# Patient Record
Sex: Female | Born: 1955 | Race: White | Hispanic: No | Smoking: Former smoker
Health system: Southern US, Community
[De-identification: ages and names within clinical notes are randomized; demographics above are authoritative.]

## PROBLEM LIST (undated history)

## (undated) DIAGNOSIS — M2550 Pain in unspecified joint: Secondary | ICD-10-CM

## (undated) DIAGNOSIS — M549 Dorsalgia, unspecified: Secondary | ICD-10-CM

## (undated) DIAGNOSIS — K759 Inflammatory liver disease, unspecified: Secondary | ICD-10-CM

## (undated) DIAGNOSIS — M545 Low back pain: Secondary | ICD-10-CM

## (undated) DIAGNOSIS — J019 Acute sinusitis, unspecified: Secondary | ICD-10-CM

## (undated) DIAGNOSIS — L049 Acute lymphadenitis, unspecified: Secondary | ICD-10-CM

## (undated) DIAGNOSIS — I509 Heart failure, unspecified: Secondary | ICD-10-CM

## (undated) DIAGNOSIS — L0390 Cellulitis, unspecified: Secondary | ICD-10-CM

## (undated) DIAGNOSIS — F411 Generalized anxiety disorder: Secondary | ICD-10-CM

## (undated) DIAGNOSIS — M25519 Pain in unspecified shoulder: Secondary | ICD-10-CM

## (undated) DIAGNOSIS — G8929 Other chronic pain: Secondary | ICD-10-CM

## (undated) DIAGNOSIS — A4902 Methicillin resistant Staphylococcus aureus infection, unspecified site: Secondary | ICD-10-CM

## (undated) DIAGNOSIS — E785 Hyperlipidemia, unspecified: Secondary | ICD-10-CM

## (undated) DIAGNOSIS — K219 Gastro-esophageal reflux disease without esophagitis: Secondary | ICD-10-CM

## (undated) DIAGNOSIS — M5090 Cervical disc disorder, unspecified, unspecified cervical region: Secondary | ICD-10-CM

## (undated) DIAGNOSIS — R7302 Impaired glucose tolerance (oral): Secondary | ICD-10-CM

## (undated) DIAGNOSIS — I70209 Unspecified atherosclerosis of native arteries of extremities, unspecified extremity: Secondary | ICD-10-CM

## (undated) DIAGNOSIS — J309 Allergic rhinitis, unspecified: Secondary | ICD-10-CM

## (undated) DIAGNOSIS — IMO0002 Reserved for concepts with insufficient information to code with codable children: Secondary | ICD-10-CM

## (undated) DIAGNOSIS — K5730 Diverticulosis of large intestine without perforation or abscess without bleeding: Secondary | ICD-10-CM

## (undated) DIAGNOSIS — R011 Cardiac murmur, unspecified: Secondary | ICD-10-CM

## (undated) DIAGNOSIS — F329 Major depressive disorder, single episode, unspecified: Secondary | ICD-10-CM

## (undated) DIAGNOSIS — M519 Unspecified thoracic, thoracolumbar and lumbosacral intervertebral disc disorder: Secondary | ICD-10-CM

## (undated) DIAGNOSIS — I880 Nonspecific mesenteric lymphadenitis: Secondary | ICD-10-CM

## (undated) DIAGNOSIS — I10 Essential (primary) hypertension: Secondary | ICD-10-CM

## (undated) DIAGNOSIS — J189 Pneumonia, unspecified organism: Secondary | ICD-10-CM

## (undated) DIAGNOSIS — E119 Type 2 diabetes mellitus without complications: Secondary | ICD-10-CM

## (undated) DIAGNOSIS — R52 Pain, unspecified: Secondary | ICD-10-CM

## (undated) DIAGNOSIS — L03019 Cellulitis of unspecified finger: Secondary | ICD-10-CM

## (undated) DIAGNOSIS — K654 Sclerosing mesenteritis: Secondary | ICD-10-CM

## (undated) DIAGNOSIS — R21 Rash and other nonspecific skin eruption: Secondary | ICD-10-CM

## (undated) HISTORY — DX: Allergic rhinitis, unspecified: J30.9

## (undated) HISTORY — DX: Cardiac murmur, unspecified: R01.1

## (undated) HISTORY — DX: Pain in unspecified shoulder: M25.519

## (undated) HISTORY — DX: Methicillin resistant Staphylococcus aureus infection, unspecified site: A49.02

## (undated) HISTORY — PX: CERVICAL DISC SURGERY: SHX588

## (undated) HISTORY — DX: Unspecified thoracic, thoracolumbar and lumbosacral intervertebral disc disorder: M51.9

## (undated) HISTORY — DX: Impaired glucose tolerance (oral): R73.02

## (undated) HISTORY — DX: Major depressive disorder, single episode, unspecified: F32.9

## (undated) HISTORY — DX: Reserved for concepts with insufficient information to code with codable children: IMO0002

## (undated) HISTORY — DX: Inflammatory liver disease, unspecified: K75.9

## (undated) HISTORY — DX: Generalized anxiety disorder: F41.1

## (undated) HISTORY — DX: Pain in unspecified joint: M25.50

## (undated) HISTORY — DX: Nonspecific mesenteric lymphadenitis: I88.0

## (undated) HISTORY — DX: Cellulitis of unspecified finger: L03.019

## (undated) HISTORY — DX: Low back pain: M54.5

## (undated) HISTORY — DX: Gastro-esophageal reflux disease without esophagitis: K21.9

## (undated) HISTORY — DX: Cervical disc disorder, unspecified, unspecified cervical region: M50.90

## (undated) HISTORY — DX: Unspecified atherosclerosis of native arteries of extremities, unspecified extremity: I70.209

## (undated) HISTORY — DX: Rash and other nonspecific skin eruption: R21

## (undated) HISTORY — DX: Sclerosing mesenteritis: K65.4

## (undated) HISTORY — DX: Acute lymphadenitis, unspecified: L04.9

## (undated) HISTORY — DX: Diverticulosis of large intestine without perforation or abscess without bleeding: K57.30

## (undated) HISTORY — DX: Other chronic pain: G89.29

## (undated) HISTORY — DX: Dorsalgia, unspecified: M54.9

## (undated) HISTORY — DX: Acute sinusitis, unspecified: J01.90

## (undated) HISTORY — PX: OTHER SURGICAL HISTORY: SHX169

## (undated) HISTORY — DX: Essential (primary) hypertension: I10

---
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
## 1997-12-21 HISTORY — PX: ABDOMINAL HYSTERECTOMY: SHX81

## 1998-02-18 ENCOUNTER — Observation Stay (HOSPITAL_COMMUNITY): Admission: RE | Admit: 1998-02-18 | Discharge: 1998-02-19 | Payer: Self-pay | Admitting: *Deleted

## 1999-02-19 ENCOUNTER — Encounter: Admission: RE | Admit: 1999-02-19 | Discharge: 1999-02-19 | Payer: Self-pay | Admitting: Family Medicine

## 1999-03-30 ENCOUNTER — Emergency Department (HOSPITAL_COMMUNITY): Admission: EM | Admit: 1999-03-30 | Discharge: 1999-03-30 | Payer: Self-pay | Admitting: Emergency Medicine

## 1999-03-30 ENCOUNTER — Encounter: Payer: Self-pay | Admitting: Emergency Medicine

## 1999-12-01 ENCOUNTER — Ambulatory Visit (HOSPITAL_COMMUNITY): Admission: RE | Admit: 1999-12-01 | Discharge: 1999-12-01 | Payer: Self-pay | Admitting: *Deleted

## 1999-12-23 ENCOUNTER — Encounter: Payer: Self-pay | Admitting: Internal Medicine

## 1999-12-23 LAB — CONVERTED CEMR LAB

## 2000-01-13 ENCOUNTER — Encounter: Payer: Self-pay | Admitting: Internal Medicine

## 2000-01-13 ENCOUNTER — Ambulatory Visit (HOSPITAL_COMMUNITY): Admission: RE | Admit: 2000-01-13 | Discharge: 2000-01-13 | Payer: Self-pay | Admitting: Internal Medicine

## 2000-02-18 ENCOUNTER — Ambulatory Visit (HOSPITAL_BASED_OUTPATIENT_CLINIC_OR_DEPARTMENT_OTHER): Admission: RE | Admit: 2000-02-18 | Discharge: 2000-02-18 | Payer: Self-pay | Admitting: Orthopedic Surgery

## 2000-03-18 ENCOUNTER — Ambulatory Visit (HOSPITAL_BASED_OUTPATIENT_CLINIC_OR_DEPARTMENT_OTHER): Admission: RE | Admit: 2000-03-18 | Discharge: 2000-03-18 | Payer: Self-pay | Admitting: Orthopedic Surgery

## 2001-01-07 ENCOUNTER — Encounter: Payer: Self-pay | Admitting: Family Medicine

## 2001-01-07 ENCOUNTER — Ambulatory Visit (HOSPITAL_COMMUNITY): Admission: RE | Admit: 2001-01-07 | Discharge: 2001-01-07 | Payer: Self-pay | Admitting: Family Medicine

## 2001-04-25 ENCOUNTER — Encounter: Payer: Self-pay | Admitting: Family Medicine

## 2001-04-25 ENCOUNTER — Ambulatory Visit (HOSPITAL_COMMUNITY): Admission: RE | Admit: 2001-04-25 | Discharge: 2001-04-25 | Payer: Self-pay | Admitting: Family Medicine

## 2001-12-29 ENCOUNTER — Encounter: Payer: Self-pay | Admitting: Family Medicine

## 2001-12-29 ENCOUNTER — Ambulatory Visit (HOSPITAL_COMMUNITY): Admission: RE | Admit: 2001-12-29 | Discharge: 2001-12-29 | Payer: Self-pay | Admitting: Family Medicine

## 2002-02-11 ENCOUNTER — Encounter: Payer: Self-pay | Admitting: Emergency Medicine

## 2002-02-11 ENCOUNTER — Emergency Department (HOSPITAL_COMMUNITY): Admission: EM | Admit: 2002-02-11 | Discharge: 2002-02-12 | Payer: Self-pay | Admitting: Emergency Medicine

## 2002-09-05 LAB — HM COLONOSCOPY

## 2002-10-02 ENCOUNTER — Other Ambulatory Visit: Admission: RE | Admit: 2002-10-02 | Discharge: 2002-10-02 | Payer: Self-pay | Admitting: *Deleted

## 2002-10-02 ENCOUNTER — Other Ambulatory Visit: Admission: RE | Admit: 2002-10-02 | Discharge: 2002-10-02 | Payer: Self-pay | Admitting: Obstetrics and Gynecology

## 2002-10-31 ENCOUNTER — Ambulatory Visit (HOSPITAL_COMMUNITY): Admission: RE | Admit: 2002-10-31 | Discharge: 2002-10-31 | Payer: Self-pay | Admitting: Internal Medicine

## 2002-10-31 ENCOUNTER — Encounter: Payer: Self-pay | Admitting: Internal Medicine

## 2003-01-19 ENCOUNTER — Encounter: Payer: Self-pay | Admitting: Internal Medicine

## 2003-01-19 ENCOUNTER — Encounter: Admission: RE | Admit: 2003-01-19 | Discharge: 2003-01-19 | Payer: Self-pay | Admitting: Internal Medicine

## 2003-06-06 ENCOUNTER — Emergency Department (HOSPITAL_COMMUNITY): Admission: EM | Admit: 2003-06-06 | Discharge: 2003-06-06 | Payer: Self-pay | Admitting: Emergency Medicine

## 2003-06-06 ENCOUNTER — Encounter: Payer: Self-pay | Admitting: Emergency Medicine

## 2003-10-22 ENCOUNTER — Encounter: Admission: RE | Admit: 2003-10-22 | Discharge: 2003-11-28 | Payer: Self-pay | Admitting: Neurosurgery

## 2003-11-02 ENCOUNTER — Encounter: Admission: RE | Admit: 2003-11-02 | Discharge: 2003-11-02 | Payer: Self-pay | Admitting: Neurosurgery

## 2003-11-23 ENCOUNTER — Encounter: Admission: RE | Admit: 2003-11-23 | Discharge: 2003-11-23 | Payer: Self-pay | Admitting: Neurosurgery

## 2003-12-10 ENCOUNTER — Encounter: Admission: RE | Admit: 2003-12-10 | Discharge: 2003-12-10 | Payer: Self-pay | Admitting: Neurosurgery

## 2004-02-04 ENCOUNTER — Emergency Department (HOSPITAL_COMMUNITY): Admission: EM | Admit: 2004-02-04 | Discharge: 2004-02-04 | Payer: Self-pay | Admitting: Emergency Medicine

## 2004-02-12 ENCOUNTER — Emergency Department (HOSPITAL_COMMUNITY): Admission: EM | Admit: 2004-02-12 | Discharge: 2004-02-12 | Payer: Self-pay | Admitting: *Deleted

## 2004-12-21 DIAGNOSIS — A4902 Methicillin resistant Staphylococcus aureus infection, unspecified site: Secondary | ICD-10-CM

## 2004-12-21 HISTORY — DX: Methicillin resistant Staphylococcus aureus infection, unspecified site: A49.02

## 2005-09-02 ENCOUNTER — Ambulatory Visit: Payer: Self-pay | Admitting: Internal Medicine

## 2005-09-17 ENCOUNTER — Ambulatory Visit: Payer: Self-pay | Admitting: Internal Medicine

## 2005-09-20 ENCOUNTER — Emergency Department (HOSPITAL_COMMUNITY): Admission: EM | Admit: 2005-09-20 | Discharge: 2005-09-20 | Payer: Self-pay | Admitting: Emergency Medicine

## 2005-10-13 ENCOUNTER — Ambulatory Visit: Payer: Self-pay | Admitting: Infectious Diseases

## 2005-10-13 ENCOUNTER — Ambulatory Visit: Payer: Self-pay | Admitting: Internal Medicine

## 2005-10-13 ENCOUNTER — Inpatient Hospital Stay (HOSPITAL_COMMUNITY): Admission: EM | Admit: 2005-10-13 | Discharge: 2005-10-19 | Payer: Self-pay | Admitting: *Deleted

## 2005-10-27 ENCOUNTER — Ambulatory Visit: Payer: Self-pay | Admitting: Internal Medicine

## 2005-11-09 ENCOUNTER — Ambulatory Visit: Payer: Self-pay | Admitting: Infectious Diseases

## 2005-11-20 ENCOUNTER — Ambulatory Visit: Payer: Self-pay | Admitting: Infectious Diseases

## 2006-01-08 ENCOUNTER — Ambulatory Visit: Payer: Self-pay | Admitting: Internal Medicine

## 2006-09-14 ENCOUNTER — Ambulatory Visit (HOSPITAL_COMMUNITY): Admission: RE | Admit: 2006-09-14 | Discharge: 2006-09-14 | Payer: Self-pay | Admitting: Obstetrics and Gynecology

## 2006-12-21 LAB — CONVERTED CEMR LAB: Pap Smear: NORMAL

## 2006-12-31 ENCOUNTER — Ambulatory Visit: Payer: Self-pay | Admitting: Internal Medicine

## 2006-12-31 LAB — CONVERTED CEMR LAB
ALT: 25 units/L (ref 0–40)
AST: 22 units/L (ref 0–37)
Albumin: 3.7 g/dL (ref 3.5–5.2)
Alkaline Phosphatase: 61 units/L (ref 39–117)
BUN: 10 mg/dL (ref 6–23)
Basophils Absolute: 0.1 10*3/uL (ref 0.0–0.1)
Basophils Relative: 1 % (ref 0.0–1.0)
Bilirubin Urine: NEGATIVE
CO2: 31 meq/L (ref 19–32)
Calcium: 8.9 mg/dL (ref 8.4–10.5)
Chloride: 107 meq/L (ref 96–112)
Chol/HDL Ratio, serum: 4.5
Cholesterol: 167 mg/dL (ref 0–200)
Creatinine, Ser: 1 mg/dL (ref 0.4–1.2)
Eosinophil percent: 2.3 % (ref 0.0–5.0)
GFR calc non Af Amer: 63 mL/min
Glomerular Filtration Rate, Af Am: 76 mL/min/{1.73_m2}
Glucose, Bld: 107 mg/dL — ABNORMAL HIGH (ref 70–99)
HCT: 40.4 % (ref 36.0–46.0)
HDL: 37.4 mg/dL — ABNORMAL LOW (ref >39.0)
Hemoglobin, Urine: NEGATIVE
Hemoglobin: 13.9 g/dL (ref 12.0–15.0)
Ketones, ur: NEGATIVE mg/dL
LDL Cholesterol: 104 mg/dL — ABNORMAL HIGH (ref 0–99)
Leukocytes, UA: NEGATIVE
Lymphocytes Relative: 40 % (ref 12.0–46.0)
MCHC: 34.4 g/dL (ref 30.0–36.0)
MCV: 91.8 fL (ref 78.0–100.0)
Monocytes Absolute: 0.8 10*3/uL — ABNORMAL HIGH (ref 0.2–0.7)
Monocytes Relative: 8.8 % (ref 3.0–11.0)
Neutro Abs: 4.7 10*3/uL (ref 1.4–7.7)
Neutrophils Relative %: 47.9 % (ref 43.0–77.0)
Nitrite: NEGATIVE
Platelets: 292 10*3/uL (ref 150–400)
Potassium: 4.4 meq/L (ref 3.5–5.1)
RBC: 4.4 M/uL (ref 3.87–5.11)
RDW: 11.4 % — ABNORMAL LOW (ref 11.5–14.6)
Sodium: 142 meq/L (ref 135–145)
Specific Gravity, Urine: 1.02 (ref 1.000–1.03)
TSH: 2.26 microintl units/mL (ref 0.35–5.50)
Total Bilirubin: 0.9 mg/dL (ref 0.3–1.2)
Total Protein, Urine: NEGATIVE mg/dL
Total Protein: 6.7 g/dL (ref 6.0–8.3)
Triglyceride fasting, serum: 126 mg/dL (ref 0–149)
Urine Glucose: NEGATIVE mg/dL
Urobilinogen, UA: 0.2 (ref 0.0–1.0)
VLDL: 25 mg/dL (ref 0–40)
WBC: 9.6 10*3/uL (ref 4.5–10.5)
pH: 6 (ref 5.0–8.0)

## 2007-01-05 ENCOUNTER — Ambulatory Visit: Payer: Self-pay | Admitting: Internal Medicine

## 2007-03-02 ENCOUNTER — Ambulatory Visit: Payer: Self-pay | Admitting: Internal Medicine

## 2007-03-22 ENCOUNTER — Ambulatory Visit: Payer: Self-pay | Admitting: Pulmonary Disease

## 2007-04-11 ENCOUNTER — Ambulatory Visit (HOSPITAL_BASED_OUTPATIENT_CLINIC_OR_DEPARTMENT_OTHER): Admission: RE | Admit: 2007-04-11 | Discharge: 2007-04-11 | Payer: Self-pay | Admitting: Pulmonary Disease

## 2007-04-22 ENCOUNTER — Ambulatory Visit: Payer: Self-pay | Admitting: Internal Medicine

## 2007-04-28 ENCOUNTER — Ambulatory Visit: Payer: Self-pay | Admitting: Pulmonary Disease

## 2007-05-03 ENCOUNTER — Ambulatory Visit: Payer: Self-pay | Admitting: Pulmonary Disease

## 2007-08-10 ENCOUNTER — Encounter: Payer: Self-pay | Admitting: Internal Medicine

## 2007-08-10 DIAGNOSIS — J309 Allergic rhinitis, unspecified: Secondary | ICD-10-CM | POA: Insufficient documentation

## 2007-08-10 HISTORY — DX: Allergic rhinitis, unspecified: J30.9

## 2007-12-06 ENCOUNTER — Ambulatory Visit: Payer: Self-pay | Admitting: Internal Medicine

## 2007-12-06 DIAGNOSIS — F411 Generalized anxiety disorder: Secondary | ICD-10-CM | POA: Insufficient documentation

## 2007-12-06 DIAGNOSIS — F329 Major depressive disorder, single episode, unspecified: Secondary | ICD-10-CM

## 2007-12-06 DIAGNOSIS — F3289 Other specified depressive episodes: Secondary | ICD-10-CM

## 2007-12-06 DIAGNOSIS — K219 Gastro-esophageal reflux disease without esophagitis: Secondary | ICD-10-CM | POA: Insufficient documentation

## 2007-12-06 DIAGNOSIS — I10 Essential (primary) hypertension: Secondary | ICD-10-CM

## 2007-12-06 DIAGNOSIS — M545 Low back pain, unspecified: Secondary | ICD-10-CM | POA: Insufficient documentation

## 2007-12-06 DIAGNOSIS — M549 Dorsalgia, unspecified: Secondary | ICD-10-CM

## 2007-12-06 DIAGNOSIS — K5730 Diverticulosis of large intestine without perforation or abscess without bleeding: Secondary | ICD-10-CM

## 2007-12-06 DIAGNOSIS — F32A Depression, unspecified: Secondary | ICD-10-CM | POA: Insufficient documentation

## 2007-12-06 HISTORY — DX: Gastro-esophageal reflux disease without esophagitis: K21.9

## 2007-12-06 HISTORY — DX: Other specified depressive episodes: F32.89

## 2007-12-06 HISTORY — DX: Low back pain, unspecified: M54.50

## 2007-12-06 HISTORY — DX: Dorsalgia, unspecified: M54.9

## 2007-12-06 HISTORY — DX: Generalized anxiety disorder: F41.1

## 2007-12-06 HISTORY — DX: Major depressive disorder, single episode, unspecified: F32.9

## 2007-12-06 HISTORY — DX: Diverticulosis of large intestine without perforation or abscess without bleeding: K57.30

## 2007-12-06 HISTORY — DX: Essential (primary) hypertension: I10

## 2008-02-03 ENCOUNTER — Ambulatory Visit: Payer: Self-pay | Admitting: Internal Medicine

## 2008-02-03 LAB — CONVERTED CEMR LAB
ALT: 24 units/L (ref 0–35)
AST: 20 units/L (ref 0–37)
Albumin: 4 g/dL (ref 3.5–5.2)
Alkaline Phosphatase: 62 units/L (ref 39–117)
BUN: 11 mg/dL (ref 6–23)
Basophils Absolute: 0 10*3/uL (ref 0.0–0.1)
Basophils Relative: 0.5 % (ref 0.0–1.0)
Bilirubin Urine: NEGATIVE
Bilirubin, Direct: 0.1 mg/dL (ref 0.0–0.3)
CO2: 30 meq/L (ref 19–32)
Calcium: 8.7 mg/dL (ref 8.4–10.5)
Chloride: 108 meq/L (ref 96–112)
Cholesterol: 151 mg/dL (ref 0–200)
Creatinine, Ser: 0.9 mg/dL (ref 0.4–1.2)
Eosinophils Absolute: 0.2 10*3/uL (ref 0.0–0.6)
Eosinophils Relative: 1.8 % (ref 0.0–5.0)
GFR calc Af Amer: 85 mL/min
GFR calc non Af Amer: 70 mL/min
Glucose, Bld: 106 mg/dL — ABNORMAL HIGH (ref 70–99)
HCT: 43 % (ref 36.0–46.0)
HDL: 35.2 mg/dL — ABNORMAL LOW (ref >39.0)
Hemoglobin, Urine: NEGATIVE
Hemoglobin: 14.4 g/dL (ref 12.0–15.0)
Ketones, ur: NEGATIVE mg/dL
LDL Cholesterol: 98 mg/dL (ref 0–99)
Leukocytes, UA: NEGATIVE
Lymphocytes Relative: 38.7 % (ref 12.0–46.0)
MCHC: 33.4 g/dL (ref 30.0–36.0)
MCV: 92.3 fL (ref 78.0–100.0)
Monocytes Absolute: 0.8 10*3/uL — ABNORMAL HIGH (ref 0.2–0.7)
Monocytes Relative: 8.4 % (ref 3.0–11.0)
Neutro Abs: 4.8 10*3/uL (ref 1.4–7.7)
Neutrophils Relative %: 50.6 % (ref 43.0–77.0)
Nitrite: NEGATIVE
Platelets: 273 10*3/uL (ref 150–400)
Potassium: 4.2 meq/L (ref 3.5–5.1)
RBC: 4.66 M/uL (ref 3.87–5.11)
RDW: 12 % (ref 11.5–14.6)
Sodium: 142 meq/L (ref 135–145)
Specific Gravity, Urine: 1.02 (ref 1.000–1.03)
TSH: 1.35 microintl units/mL (ref 0.35–5.50)
Total Bilirubin: 1 mg/dL (ref 0.3–1.2)
Total CHOL/HDL Ratio: 4.3
Total Protein, Urine: NEGATIVE mg/dL
Total Protein: 6.9 g/dL (ref 6.0–8.3)
Triglycerides: 88 mg/dL (ref 0–149)
Urine Glucose: NEGATIVE mg/dL
Urobilinogen, UA: 0.2 (ref 0.0–1.0)
VLDL: 18 mg/dL (ref 0–40)
WBC: 9.5 10*3/uL (ref 4.5–10.5)
pH: 6 (ref 5.0–8.0)

## 2008-02-07 ENCOUNTER — Ambulatory Visit: Payer: Self-pay | Admitting: Internal Medicine

## 2008-05-09 ENCOUNTER — Ambulatory Visit: Payer: Self-pay | Admitting: Internal Medicine

## 2008-05-09 DIAGNOSIS — R21 Rash and other nonspecific skin eruption: Secondary | ICD-10-CM

## 2008-05-09 DIAGNOSIS — M25519 Pain in unspecified shoulder: Secondary | ICD-10-CM | POA: Insufficient documentation

## 2008-05-09 DIAGNOSIS — J019 Acute sinusitis, unspecified: Secondary | ICD-10-CM

## 2008-05-09 HISTORY — DX: Rash and other nonspecific skin eruption: R21

## 2008-05-09 HISTORY — DX: Pain in unspecified shoulder: M25.519

## 2008-05-09 HISTORY — DX: Acute sinusitis, unspecified: J01.90

## 2008-05-15 ENCOUNTER — Encounter (INDEPENDENT_AMBULATORY_CARE_PROVIDER_SITE_OTHER): Payer: Self-pay | Admitting: *Deleted

## 2008-07-09 ENCOUNTER — Encounter: Payer: Self-pay | Admitting: Internal Medicine

## 2008-07-13 ENCOUNTER — Telehealth (INDEPENDENT_AMBULATORY_CARE_PROVIDER_SITE_OTHER): Payer: Self-pay | Admitting: *Deleted

## 2008-07-17 ENCOUNTER — Encounter: Admission: RE | Admit: 2008-07-17 | Discharge: 2008-07-17 | Payer: Self-pay | Admitting: Otolaryngology

## 2008-08-24 ENCOUNTER — Encounter: Payer: Self-pay | Admitting: Internal Medicine

## 2008-09-19 ENCOUNTER — Telehealth (INDEPENDENT_AMBULATORY_CARE_PROVIDER_SITE_OTHER): Payer: Self-pay | Admitting: *Deleted

## 2008-12-28 ENCOUNTER — Ambulatory Visit: Payer: Self-pay | Admitting: Internal Medicine

## 2008-12-28 DIAGNOSIS — IMO0002 Reserved for concepts with insufficient information to code with codable children: Secondary | ICD-10-CM

## 2008-12-28 DIAGNOSIS — G8929 Other chronic pain: Secondary | ICD-10-CM | POA: Insufficient documentation

## 2008-12-28 DIAGNOSIS — M545 Low back pain: Secondary | ICD-10-CM

## 2008-12-28 HISTORY — DX: Reserved for concepts with insufficient information to code with codable children: IMO0002

## 2009-01-04 ENCOUNTER — Encounter: Payer: Self-pay | Admitting: Internal Medicine

## 2009-01-13 ENCOUNTER — Encounter: Admission: RE | Admit: 2009-01-13 | Discharge: 2009-01-13 | Payer: Self-pay | Admitting: Sports Medicine

## 2009-01-15 ENCOUNTER — Encounter: Admission: RE | Admit: 2009-01-15 | Discharge: 2009-01-15 | Payer: Self-pay | Admitting: Sports Medicine

## 2009-01-29 ENCOUNTER — Encounter: Admission: RE | Admit: 2009-01-29 | Discharge: 2009-01-29 | Payer: Self-pay | Admitting: Sports Medicine

## 2009-02-04 ENCOUNTER — Ambulatory Visit: Payer: Self-pay | Admitting: Internal Medicine

## 2009-02-04 LAB — CONVERTED CEMR LAB
ALT: 19 units/L (ref 0–35)
AST: 14 units/L (ref 0–37)
Albumin: 3.6 g/dL (ref 3.5–5.2)
Alkaline Phosphatase: 69 units/L (ref 39–117)
BUN: 15 mg/dL (ref 6–23)
Basophils Absolute: 0.1 10*3/uL (ref 0.0–0.1)
Basophils Relative: 0.6 % (ref 0.0–3.0)
Bilirubin Urine: NEGATIVE
Bilirubin, Direct: 0.2 mg/dL (ref 0.0–0.3)
CO2: 29 meq/L (ref 19–32)
Calcium: 8.8 mg/dL (ref 8.4–10.5)
Chloride: 109 meq/L (ref 96–112)
Cholesterol: 177 mg/dL (ref 0–200)
Creatinine, Ser: 0.8 mg/dL (ref 0.4–1.2)
Eosinophils Absolute: 0.2 10*3/uL (ref 0.0–0.7)
Eosinophils Relative: 2 % (ref 0.0–5.0)
GFR calc Af Amer: 97 mL/min
GFR calc non Af Amer: 80 mL/min
Glucose, Bld: 99 mg/dL (ref 70–99)
HCT: 40.4 % (ref 36.0–46.0)
HDL: 57.7 mg/dL (ref >39.0)
Hemoglobin, Urine: NEGATIVE
Hemoglobin: 13.9 g/dL (ref 12.0–15.0)
Ketones, ur: NEGATIVE mg/dL
LDL Cholesterol: 108 mg/dL — ABNORMAL HIGH (ref 0–99)
Leukocytes, UA: NEGATIVE
Lymphocytes Relative: 34.1 % (ref 12.0–46.0)
MCHC: 34.5 g/dL (ref 30.0–36.0)
MCV: 91.7 fL (ref 78.0–100.0)
Monocytes Absolute: 0.8 10*3/uL (ref 0.1–1.0)
Monocytes Relative: 7.2 % (ref 3.0–12.0)
Neutro Abs: 6.1 10*3/uL (ref 1.4–7.7)
Neutrophils Relative %: 56.1 % (ref 43.0–77.0)
Nitrite: NEGATIVE
Platelets: 283 10*3/uL (ref 150–400)
Potassium: 4.1 meq/L (ref 3.5–5.1)
RBC: 4.41 M/uL (ref 3.87–5.11)
RDW: 11.9 % (ref 11.5–14.6)
Sodium: 142 meq/L (ref 135–145)
Specific Gravity, Urine: 1.015 (ref 1.000–1.03)
TSH: 1.9 microintl units/mL (ref 0.35–5.50)
Total Bilirubin: 1.1 mg/dL (ref 0.3–1.2)
Total CHOL/HDL Ratio: 3.1
Total Protein, Urine: NEGATIVE mg/dL
Total Protein: 6.5 g/dL (ref 6.0–8.3)
Triglycerides: 57 mg/dL (ref 0–149)
Urine Glucose: NEGATIVE mg/dL
Urobilinogen, UA: 0.2 (ref 0.0–1.0)
VLDL: 11 mg/dL (ref 0–40)
WBC: 11 10*3/uL — ABNORMAL HIGH (ref 4.5–10.5)
pH: 6 (ref 5.0–8.0)

## 2009-02-07 ENCOUNTER — Ambulatory Visit: Payer: Self-pay | Admitting: Internal Medicine

## 2009-02-07 DIAGNOSIS — A4902 Methicillin resistant Staphylococcus aureus infection, unspecified site: Secondary | ICD-10-CM | POA: Insufficient documentation

## 2009-02-12 ENCOUNTER — Encounter: Admission: RE | Admit: 2009-02-12 | Discharge: 2009-02-12 | Payer: Self-pay | Admitting: Sports Medicine

## 2009-04-05 ENCOUNTER — Encounter: Payer: Self-pay | Admitting: Internal Medicine

## 2009-05-08 ENCOUNTER — Encounter: Admission: RE | Admit: 2009-05-08 | Discharge: 2009-05-08 | Payer: Self-pay | Admitting: Sports Medicine

## 2009-05-09 ENCOUNTER — Encounter: Payer: Self-pay | Admitting: Internal Medicine

## 2009-05-13 ENCOUNTER — Inpatient Hospital Stay (HOSPITAL_COMMUNITY): Admission: AD | Admit: 2009-05-13 | Discharge: 2009-05-21 | Payer: Self-pay | Admitting: Neurosurgery

## 2009-05-25 ENCOUNTER — Inpatient Hospital Stay (HOSPITAL_COMMUNITY): Admission: EM | Admit: 2009-05-25 | Discharge: 2009-05-29 | Payer: Self-pay | Admitting: Emergency Medicine

## 2009-05-30 ENCOUNTER — Ambulatory Visit: Payer: Self-pay | Admitting: Internal Medicine

## 2009-05-30 ENCOUNTER — Inpatient Hospital Stay (HOSPITAL_COMMUNITY): Admission: EM | Admit: 2009-05-30 | Discharge: 2009-06-03 | Payer: Self-pay | Admitting: Emergency Medicine

## 2009-05-31 ENCOUNTER — Encounter (INDEPENDENT_AMBULATORY_CARE_PROVIDER_SITE_OTHER): Payer: Self-pay | Admitting: Internal Medicine

## 2009-06-03 ENCOUNTER — Telehealth: Payer: Self-pay | Admitting: Internal Medicine

## 2009-06-06 ENCOUNTER — Telehealth (INDEPENDENT_AMBULATORY_CARE_PROVIDER_SITE_OTHER): Payer: Self-pay | Admitting: *Deleted

## 2009-06-06 ENCOUNTER — Telehealth: Payer: Self-pay | Admitting: Internal Medicine

## 2009-06-06 ENCOUNTER — Emergency Department (HOSPITAL_COMMUNITY): Admission: EM | Admit: 2009-06-06 | Discharge: 2009-06-06 | Payer: Self-pay | Admitting: Emergency Medicine

## 2009-06-19 ENCOUNTER — Encounter: Payer: Self-pay | Admitting: Internal Medicine

## 2009-07-12 ENCOUNTER — Encounter: Payer: Self-pay | Admitting: Internal Medicine

## 2009-07-17 ENCOUNTER — Ambulatory Visit: Payer: Self-pay | Admitting: Internal Medicine

## 2009-07-17 DIAGNOSIS — R609 Edema, unspecified: Secondary | ICD-10-CM | POA: Insufficient documentation

## 2009-07-17 LAB — CONVERTED CEMR LAB
ALT: 54 units/L — ABNORMAL HIGH (ref 0–35)
AST: 42 units/L — ABNORMAL HIGH (ref 0–37)
Albumin: 4 g/dL (ref 3.5–5.2)
Alkaline Phosphatase: 67 units/L (ref 39–117)
BUN: 9 mg/dL (ref 6–23)
Basophils Absolute: 0.1 10*3/uL (ref 0.0–0.1)
Basophils Relative: 1.1 % (ref 0.0–3.0)
Bilirubin Urine: NEGATIVE
Bilirubin, Direct: 0.1 mg/dL (ref 0.0–0.3)
CO2: 33 meq/L — ABNORMAL HIGH (ref 19–32)
Calcium: 9.1 mg/dL (ref 8.4–10.5)
Chloride: 106 meq/L (ref 96–112)
Creatinine, Ser: 0.8 mg/dL (ref 0.4–1.2)
Eosinophils Absolute: 0.2 10*3/uL (ref 0.0–0.7)
Eosinophils Relative: 2.2 % (ref 0.0–5.0)
GFR calc non Af Amer: 80 mL/min (ref >60)
Glucose, Bld: 104 mg/dL — ABNORMAL HIGH (ref 70–99)
HCT: 38.6 % (ref 36.0–46.0)
Hemoglobin, Urine: NEGATIVE
Hemoglobin: 13.2 g/dL (ref 12.0–15.0)
Ketones, ur: NEGATIVE mg/dL
Leukocytes, UA: NEGATIVE
Lymphocytes Relative: 46.6 % — ABNORMAL HIGH (ref 12.0–46.0)
Lymphs Abs: 3.2 10*3/uL (ref 0.7–4.0)
MCHC: 34.2 g/dL (ref 30.0–36.0)
MCV: 91 fL (ref 78.0–100.0)
Monocytes Absolute: 0.7 10*3/uL (ref 0.1–1.0)
Monocytes Relative: 10.2 % (ref 3.0–12.0)
Neutro Abs: 2.8 10*3/uL (ref 1.4–7.7)
Neutrophils Relative %: 39.9 % — ABNORMAL LOW (ref 43.0–77.0)
Nitrite: NEGATIVE
Platelets: 286 10*3/uL (ref 150.0–400.0)
Potassium: 4.4 meq/L (ref 3.5–5.1)
RBC: 4.24 M/uL (ref 3.87–5.11)
RDW: 12.5 % (ref 11.5–14.6)
Sodium: 146 meq/L — ABNORMAL HIGH (ref 135–145)
Specific Gravity, Urine: 1.01 (ref 1.000–1.030)
TSH: 1.37 microintl units/mL (ref 0.35–5.50)
Total Bilirubin: 1.1 mg/dL (ref 0.3–1.2)
Total Protein, Urine: NEGATIVE mg/dL
Total Protein: 7.1 g/dL (ref 6.0–8.3)
Urine Glucose: NEGATIVE mg/dL
Urobilinogen, UA: 0.2 (ref 0.0–1.0)
WBC: 7 10*3/uL (ref 4.5–10.5)
pH: 7.5 (ref 5.0–8.0)

## 2009-08-13 ENCOUNTER — Ambulatory Visit: Payer: Self-pay | Admitting: Internal Medicine

## 2009-08-13 DIAGNOSIS — L049 Acute lymphadenitis, unspecified: Secondary | ICD-10-CM | POA: Insufficient documentation

## 2009-08-13 DIAGNOSIS — R079 Chest pain, unspecified: Secondary | ICD-10-CM | POA: Insufficient documentation

## 2009-08-13 DIAGNOSIS — L03019 Cellulitis of unspecified finger: Secondary | ICD-10-CM

## 2009-08-13 HISTORY — DX: Cellulitis of unspecified finger: L03.019

## 2009-09-10 ENCOUNTER — Telehealth: Payer: Self-pay | Admitting: Internal Medicine

## 2009-10-07 ENCOUNTER — Encounter: Payer: Self-pay | Admitting: Internal Medicine

## 2009-10-24 ENCOUNTER — Telehealth: Payer: Self-pay | Admitting: Internal Medicine

## 2009-11-27 ENCOUNTER — Telehealth: Payer: Self-pay | Admitting: Internal Medicine

## 2009-12-11 ENCOUNTER — Ambulatory Visit: Payer: Self-pay | Admitting: Internal Medicine

## 2009-12-21 DIAGNOSIS — L049 Acute lymphadenitis, unspecified: Secondary | ICD-10-CM

## 2009-12-21 DIAGNOSIS — L0390 Cellulitis, unspecified: Secondary | ICD-10-CM

## 2009-12-21 HISTORY — DX: Acute lymphadenitis, unspecified: L04.9

## 2009-12-21 HISTORY — DX: Cellulitis, unspecified: L03.90

## 2010-01-16 ENCOUNTER — Telehealth (INDEPENDENT_AMBULATORY_CARE_PROVIDER_SITE_OTHER): Payer: Self-pay | Admitting: *Deleted

## 2010-02-06 ENCOUNTER — Telehealth: Payer: Self-pay | Admitting: Internal Medicine

## 2010-02-10 ENCOUNTER — Ambulatory Visit: Payer: Self-pay | Admitting: Internal Medicine

## 2010-02-10 ENCOUNTER — Encounter (INDEPENDENT_AMBULATORY_CARE_PROVIDER_SITE_OTHER): Payer: Self-pay | Admitting: *Deleted

## 2010-02-10 DIAGNOSIS — J029 Acute pharyngitis, unspecified: Secondary | ICD-10-CM | POA: Insufficient documentation

## 2010-03-19 ENCOUNTER — Encounter: Payer: Self-pay | Admitting: Internal Medicine

## 2010-03-26 ENCOUNTER — Telehealth: Payer: Self-pay | Admitting: Internal Medicine

## 2010-04-21 ENCOUNTER — Ambulatory Visit: Payer: Self-pay | Admitting: Internal Medicine

## 2010-04-21 DIAGNOSIS — M2550 Pain in unspecified joint: Secondary | ICD-10-CM | POA: Insufficient documentation

## 2010-04-21 HISTORY — DX: Pain in unspecified joint: M25.50

## 2010-04-21 LAB — CONVERTED CEMR LAB
Anti Nuclear Antibody(ANA): NEGATIVE
ds DNA Ab: 4 (ref <30)

## 2010-04-22 LAB — CONVERTED CEMR LAB
Rheumatoid fact SerPl-aCnc: 20 intl units/mL (ref 0.0–20.0)
Sed Rate: 11 mm/hr (ref 0–22)

## 2010-04-23 ENCOUNTER — Encounter: Payer: Self-pay | Admitting: Internal Medicine

## 2010-06-04 ENCOUNTER — Encounter: Payer: Self-pay | Admitting: Internal Medicine

## 2010-06-10 ENCOUNTER — Telehealth: Payer: Self-pay | Admitting: Internal Medicine

## 2010-06-24 ENCOUNTER — Ambulatory Visit: Payer: Self-pay | Admitting: Internal Medicine

## 2010-06-24 LAB — CONVERTED CEMR LAB
ALT: 52 units/L — ABNORMAL HIGH (ref 0–35)
AST: 37 units/L (ref 0–37)
Albumin: 3.9 g/dL (ref 3.5–5.2)
Alkaline Phosphatase: 68 units/L (ref 39–117)
BUN: 12 mg/dL (ref 6–23)
Basophils Absolute: 0.1 10*3/uL (ref 0.0–0.1)
Basophils Relative: 1.1 % (ref 0.0–3.0)
Bilirubin Urine: NEGATIVE
Bilirubin, Direct: 0.1 mg/dL (ref 0.0–0.3)
CO2: 30 meq/L (ref 19–32)
Calcium: 8.4 mg/dL (ref 8.4–10.5)
Chloride: 104 meq/L (ref 96–112)
Cholesterol: 221 mg/dL — ABNORMAL HIGH (ref 0–200)
Creatinine, Ser: 0.8 mg/dL (ref 0.4–1.2)
Direct LDL: 139.5 mg/dL
Eosinophils Absolute: 0.5 10*3/uL (ref 0.0–0.7)
Eosinophils Relative: 6.5 % — ABNORMAL HIGH (ref 0.0–5.0)
GFR calc non Af Amer: 76.39 mL/min (ref >60)
Glucose, Bld: 84 mg/dL (ref 70–99)
HCT: 37.1 % (ref 36.0–46.0)
HDL: 42.7 mg/dL (ref >39.00)
Hemoglobin: 12.6 g/dL (ref 12.0–15.0)
Ketones, ur: NEGATIVE mg/dL
Lymphocytes Relative: 46.7 % — ABNORMAL HIGH (ref 12.0–46.0)
Lymphs Abs: 3.7 10*3/uL (ref 0.7–4.0)
MCHC: 33.8 g/dL (ref 30.0–36.0)
MCV: 92.5 fL (ref 78.0–100.0)
Monocytes Absolute: 0.6 10*3/uL (ref 0.1–1.0)
Monocytes Relative: 7.2 % (ref 3.0–12.0)
Neutro Abs: 3.1 10*3/uL (ref 1.4–7.7)
Neutrophils Relative %: 38.5 % — ABNORMAL LOW (ref 43.0–77.0)
Nitrite: NEGATIVE
Platelets: 237 10*3/uL (ref 150.0–400.0)
Potassium: 4.3 meq/L (ref 3.5–5.1)
RBC: 4.01 M/uL (ref 3.87–5.11)
RDW: 12.9 % (ref 11.5–14.6)
Sodium: 142 meq/L (ref 135–145)
Specific Gravity, Urine: >=1.03 (ref 1.000–1.030)
TSH: 2.71 microintl units/mL (ref 0.35–5.50)
Total Bilirubin: 0.7 mg/dL (ref 0.3–1.2)
Total CHOL/HDL Ratio: 5
Total Protein, Urine: NEGATIVE mg/dL
Total Protein: 6.6 g/dL (ref 6.0–8.3)
Triglycerides: 202 mg/dL — ABNORMAL HIGH (ref 0.0–149.0)
Urine Glucose: NEGATIVE mg/dL
Urobilinogen, UA: 0.2 (ref 0.0–1.0)
VLDL: 40.4 mg/dL — ABNORMAL HIGH (ref 0.0–40.0)
WBC: 8 10*3/uL (ref 4.5–10.5)
pH: 5.5 (ref 5.0–8.0)

## 2010-06-27 ENCOUNTER — Ambulatory Visit: Payer: Self-pay | Admitting: Internal Medicine

## 2010-07-02 ENCOUNTER — Encounter: Payer: Self-pay | Admitting: Internal Medicine

## 2010-07-30 ENCOUNTER — Encounter: Payer: Self-pay | Admitting: Internal Medicine

## 2010-08-09 ENCOUNTER — Emergency Department (HOSPITAL_COMMUNITY): Admission: EM | Admit: 2010-08-09 | Discharge: 2010-08-10 | Payer: Self-pay | Admitting: Emergency Medicine

## 2010-08-21 ENCOUNTER — Telehealth (INDEPENDENT_AMBULATORY_CARE_PROVIDER_SITE_OTHER): Payer: Self-pay | Admitting: *Deleted

## 2010-09-02 ENCOUNTER — Telehealth: Payer: Self-pay | Admitting: Internal Medicine

## 2010-10-20 ENCOUNTER — Telehealth: Payer: Self-pay | Admitting: Internal Medicine

## 2010-11-05 ENCOUNTER — Encounter: Payer: Self-pay | Admitting: Internal Medicine

## 2010-11-26 ENCOUNTER — Telehealth: Payer: Self-pay | Admitting: Internal Medicine

## 2011-01-01 ENCOUNTER — Telehealth: Payer: Self-pay | Admitting: Internal Medicine

## 2011-01-05 ENCOUNTER — Encounter: Payer: Self-pay | Admitting: Internal Medicine

## 2011-01-11 ENCOUNTER — Encounter: Payer: Self-pay | Admitting: Obstetrics and Gynecology

## 2011-01-12 ENCOUNTER — Encounter
Admission: RE | Admit: 2011-01-12 | Discharge: 2011-01-12 | Payer: Self-pay | Source: Home / Self Care | Attending: Sports Medicine | Admitting: Sports Medicine

## 2011-01-20 NOTE — Progress Notes (Signed)
  Phone Note Other Incoming   Request: Send information Summary of Call: Request received from [facility]uler[facility] at law  forwarded to [employer].

## 2011-01-20 NOTE — Progress Notes (Signed)
Summary: rx refill request  Phone Note Refill Request Message from:  Patient on September 02, 2010 4:37 PM  Refills Requested: Medication #1:  PERCOCET 5-325 MG TABS 1po 3 times a day as needed - to fill july 21   Dosage confirmed as above?Dosage Confirmed Initial call taken by: Brenton Grills MA,  September 02, 2010 4:37 PM  Follow-up for Phone Call        done hardcopy to LIM side B - dahlia   Follow-up by: Corwin Levins MD,  September 02, 2010 5:44 PM  Additional Follow-up for Phone Call Additional follow up Details #1::        pt informed via VM, Rx in cabinet for pt pick up Additional Follow-up by: Margaret Pyle, CMA,  September 03, 2010 8:04 AM    New/Updated Medications: PERCOCET 5-325 MG TABS (OXYCODONE-ACETAMINOPHEN) 1po 3 times a day as needed - to fill sept 14, 2011 Prescriptions: PERCOCET 5-325 MG TABS (OXYCODONE-ACETAMINOPHEN) 1po 3 times a day as needed - to fill sept 14, 2011  #90 x 0   Entered and Authorized by:   Corwin Levins MD   Signed by:   Corwin Levins MD on 09/02/2010   Method used:   Print then Give to Patient   RxID:   [id number]

## 2011-01-20 NOTE — Letter (Signed)
Summary: [facility]  [facility]   Imported By: Sherian Rein 08/04/2010 09:52:17  _____________________________________________________________________  External Attachment:    Type:   Image     Comment:   External Document

## 2011-01-20 NOTE — Letter (Signed)
Summary: [facility] [place]  [facility] [place]   Imported By: Lennie Odor 06/09/2010 14:40:25  _____________________________________________________________________  External Attachment:    Type:   Image     Comment:   External Document

## 2011-01-20 NOTE — Progress Notes (Signed)
Summary: Percocet  Phone Note Call from Patient Call back at Home Phone [phone]   Caller: Patient Summary of Call: pt called requesting refill of Percocet Initial call taken by: Margaret Pyle, CMA,  March 26, 2010 9:09 AM  Follow-up for Phone Call        pt informed via secure VM, rx in cabinet for pick up Follow-up by: Margaret Pyle, CMA,  March 26, 2010 12:46 PM    New/Updated Medications: PERCOCET 5-325 MG TABS (OXYCODONE-ACETAMINOPHEN) 1po two times a day as needed - to fill Mar 26, 2010 Prescriptions: PERCOCET 5-325 MG TABS (OXYCODONE-ACETAMINOPHEN) 1po two times a day as needed - to fill Mar 26, 2010  #60 x 0   Entered and Authorized by:   Corwin Levins MD   Signed by:   Corwin Levins MD on 03/26/2010   Method used:   Print then Give to Patient   RxID:   [id number]  done hardcopy to LIM side B - dahlia  Corwin Levins MD  March 26, 2010 12:09 PM

## 2011-01-20 NOTE — Progress Notes (Signed)
Summary: Percocet  Phone Note Call from Patient Call back at Home Phone [phone]   Caller: Patient Summary of Call: pt called requesting refills of Percocet. Initial call taken by: Margaret Pyle, CMA,  February 06, 2010 1:56 PM  Follow-up for Phone Call        pt informed via VM. rx in cabinet for pick up Follow-up by: Margaret Pyle, CMA,  February 06, 2010 2:27 PM    New/Updated Medications: PERCOCET 5-325 MG TABS (OXYCODONE-ACETAMINOPHEN) 1po two times a day as needed - to fill Feb 06, 2010 Prescriptions: PERCOCET 5-325 MG TABS (OXYCODONE-ACETAMINOPHEN) 1po two times a day as needed - to fill Feb 06, 2010  #60 x 0   Entered and Authorized by:   Corwin Levins MD   Signed by:   Corwin Levins MD on 02/06/2010   Method used:   Print then Give to Patient   RxID:   [id number]  done hardcopy to LIM side B - dahlia Corwin Levins MD  February 06, 2010 2:24 PM

## 2011-01-20 NOTE — Consult Note (Signed)
Summary: [facility]   Imported By: Sherian Rein 04/28/2010 13:35:27  _____________________________________________________________________  External Attachment:    Type:   Image     Comment:   External Document

## 2011-01-20 NOTE — Progress Notes (Signed)
Summary: Percocet  Phone Note Call from Patient Call back at Home Phone [phone]   Caller: Patient Summary of Call: Pt called requesting refill of Percocet Initial call taken by: Margaret Pyle, CMA,  November 26, 2010 11:15 AM  Follow-up for Phone Call        Pt informed, Rx in cabinet for pt pick up Follow-up by: Margaret Pyle, CMA,  November 26, 2010 12:55 PM    New/Updated Medications: PERCOCET 5-325 MG TABS (OXYCODONE-ACETAMINOPHEN) 1po 3 times a day as needed - to fill Nov 26, 2010 Prescriptions: PERCOCET 5-325 MG TABS (OXYCODONE-ACETAMINOPHEN) 1po 3 times a day as needed - to fill Nov 26, 2010  #90 x 0   Entered and Authorized by:   Corwin Levins MD   Signed by:   Corwin Levins MD on 11/26/2010   Method used:   Print then Give to Patient   RxID:   [id number]  done hardcopy to LIM side B - dahlia Corwin Levins MD  November 26, 2010 12:33 PM

## 2011-01-20 NOTE — Progress Notes (Signed)
Summary: Record request  Request for records received from [facility] Request forwarded to [employer]. Wilder Glade  January 16, 2010 4:32 PM

## 2011-01-20 NOTE — Progress Notes (Signed)
Summary: Rx refill  Phone Note Call from Patient Call back at Home Phone [phone]   Caller: Patient Summary of Call: Pt called requesting refill of Percocet and Xanax Initial call taken by: Margaret Pyle, CMA,  October 20, 2010 4:51 PM  Follow-up for Phone Call        Pt informed, Rx in cabinet for pt pick up Follow-up by: Margaret Pyle, CMA,  October 21, 2010 8:09 AM    New/Updated Medications: ALPRAZOLAM 0.5 MG  TABS (ALPRAZOLAM) 1 by mouth two times a day as needed nerves - to fill Oct 21, 2010 PERCOCET 5-325 MG TABS (OXYCODONE-ACETAMINOPHEN) 1po 3 times a day as needed - to fill Oct 21, 2010 Prescriptions: ALPRAZOLAM 0.5 MG  TABS (ALPRAZOLAM) 1 by mouth two times a day as needed nerves - to fill Oct 21, 2010  #60 x 2   Entered and Authorized by:   Corwin Levins MD   Signed by:   Corwin Levins MD on 10/20/2010   Method used:   Print then Give to Patient   RxID:   [id number] PERCOCET 5-325 MG TABS (OXYCODONE-ACETAMINOPHEN) 1po 3 times a day as needed - to fill Oct 21, 2010  #90 x 0   Entered and Authorized by:   Corwin Levins MD   Signed by:   Corwin Levins MD on 10/20/2010   Method used:   Print then Give to Patient   RxID:   [id number]  done hardcopy to LIM side B - dahlia Corwin Levins MD  October 20, 2010 5:32 PM

## 2011-01-20 NOTE — Progress Notes (Signed)
Summary: Percocet  Phone Note Call from Patient Call back at Home Phone [phone]   Caller: Patient Summary of Call: Pt called requesting refill of Percocet Initial call taken by: Margaret Pyle, CMA,  June 10, 2010 3:14 PM  Follow-up for Phone Call        pt informed, Rx in cabinet for pt pick up Follow-up by: Margaret Pyle, CMA,  June 10, 2010 3:39 PM    New/Updated Medications: PERCOCET 5-325 MG TABS (OXYCODONE-ACETAMINOPHEN) 1po 3 times a day as needed - to fill June 10, 2010 Prescriptions: PERCOCET 5-325 MG TABS (OXYCODONE-ACETAMINOPHEN) 1po 3 times a day as needed - to fill June 10, 2010  #90 x 0   Entered and Authorized by:   Corwin Levins MD   Signed by:   Corwin Levins MD on 06/10/2010   Method used:   Print then Give to Patient   RxID:   [id number]  done hardcopy to LIM side B - dahlia  Corwin Levins MD  June 10, 2010 3:32 PM

## 2011-01-20 NOTE — Letter (Signed)
Summary: [facility]  [facility]   Imported By: Sherian Rein 03/27/2010 14:48:14  _____________________________________________________________________  External Attachment:    Type:   Image     Comment:   External Document

## 2011-01-20 NOTE — Assessment & Plan Note (Signed)
Summary: sore thraot/no fever/john/cd   Vital Signs:  Patient profile:   55 year old female Height:      65 inches (165.10 cm) Weight:      313.8 pounds (142.64 kg) O2 Sat:      95 % on Room air Temp:     98.9 degrees F (37.17 degrees C) oral Pulse rate:   94 / minute BP sitting:   136 / 72  (left arm) Cuff size:   large  Vitals Entered By: Orlan Leavens (February 10, 2010 11:38 AM)  O2 Flow:  Room air CC: sore throat/ dry cough/ bodyaches x's 3 days Is Patient Diabetic? No Pain Assessment Patient in pain? no        Primary Care Provider:  Corwin Levins MD  CC:  sore throat/ dry cough/ bodyaches x's 3 days.  History of Present Illness: here today with complaint of sore throat. onset of symptoms was 3 days ago. course has been sudden onset and now occurs in progressive "deepening" pattern. problem precipitated by +sick contacts at work symptom characterized as pain with swallow and talking or coughing problem associated with dry cough, and diffuse myalasie but not associated with fever, sputum or HA . symptoms improved by use of nyquil (but now with more cough). symptoms worsened with talking. no prior hx of same symptoms.   Current Medications (verified): 1)  Alprazolam 0.5 Mg  Tabs (Alprazolam) .Marland Kitchen.. 1 By Mouth Two Times A Day As Needed Nerves 2)  Percocet 5-325 Mg Tabs (Oxycodone-Acetaminophen) .Marland Kitchen.. 1po Two Times A Day As Needed - To Fill Feb 06, 2010 3)  Furosemide 40 Mg Tabs (Furosemide) .Marland Kitchen.. 1 By Mouth Once Daily As Needed  Allergies (verified): 1)  ! Augmentin 2)  ! Codeine 3)  ! * Latex  Past History:  Past Medical History: Reviewed history from 02/07/2009 and no changes required. Allergic rhinitis Hypertension Low back pain spinal stenosis/DDD lumbar Anxiety Depression GERD Diverticulosis, Mazo hx of MRSA leg abscess  Review of Systems       The patient complains of weight gain and hoarseness.  The patient denies anorexia, fever, chest pain,  dyspnea on exertion, and peripheral edema.    Physical Exam  General:  alert and overweight-appearing.  mildly ill Eyes:  vision grossly intact; pupils equal, round and reactive to light.  conjunctiva and lids normal.    Ears:  normal pinnae bilaterally, without erythema, swelling, or tenderness to palpation. TMs clear, without effusion, or cerumen impaction. Hearing grossly normal bilaterally  Mouth:  teeth and gums in good repair; mucous membranes moist, without lesions or ulcers. oropharynx clear without exudate, mod erythema. clear mucus in OP Lungs:  normal respiratory effort, no intercostal retractions or use of accessory muscles; normal breath sounds bilaterally - no crackles and no wheezes.    Heart:  normal rate, regular rhythm, no murmur, and no rub. BLE with trace edema.    Impression & Recommendations:  Problem # 1:  ACUTE PHARYNGITIS (ICD-462) most likely viral etiology---  but given other comorbidities with progressive cough symptoms - feel abx warranted Zpack - also cough suppressant - cont symptoms relief with tylenol, Nyquil etc -- out of work note next 48h done Her updated medication list for this problem includes:    Azithromycin 250 Mg Tabs (Azithromycin) .Marland Kitchen... 2 tabs by mouth today, then 1 by mouth daily starting tomorrow  Complete Medication List: 1)  Alprazolam 0.5 Mg Tabs (Alprazolam) .Marland Kitchen.. 1 by mouth two times a day as needed  nerves 2)  Percocet 5-325 Mg Tabs (Oxycodone-acetaminophen) .Marland Kitchen.. 1po two times a day as needed - to fill Feb 06, 2010 3)  Furosemide 40 Mg Tabs (Furosemide) .Marland Kitchen.. 1 by mouth once daily as needed 4)  Azithromycin 250 Mg Tabs (Azithromycin) .... 2 tabs by mouth today, then 1 by mouth daily starting tomorrow 5)  Hydromet 5-1.5 Mg/52ml Syrp (Hydrocodone-homatropine) .... 5 cc by mouth every 4-6 hours as needed for mod-severe cough  Patient Instructions: 1)  it was good to see you today. 2)  antibioitcs - Zpack - as discussed - for throat and  cough symptoms  3)  also prescription cough medication with hydrocodone (do not use with percocet) 4)  ok to continue Nyquil or other cold/flu combo medications for your congestion and ache symptoms - 5)   tessalon perles ok for cough if needing medication that is nonsedating (as you have at home) 6)  Get plenty of rest, drink lots of clear liquids, and use Tylenol or Ibuprofen for fever and comfort. Return in 7-10 days if you're not better:sooner if you're feeling worse. 7)  Recommended remaining out of work for next 48h - work note provided Prescriptions: HYDROMET 5-1.5 MG/5ML SYRP (HYDROCODONE-HOMATROPINE) 5 cc by mouth every 4-6 hours as needed for mod-severe cough  #120cc x 0   Entered and Authorized by:   Newt Lukes MD   Signed by:   Newt Lukes MD on 02/10/2010   Method used:   Print then Give to Patient   RxID:   [id number] AZITHROMYCIN 250 MG TABS (AZITHROMYCIN) 2 tabs by mouth today, then 1 by mouth daily starting tomorrow  #6 x 0   Entered and Authorized by:   Newt Lukes MD   Signed by:   Newt Lukes MD on 02/10/2010   Method used:   Print then Give to Patient   RxID:   [id number]

## 2011-01-20 NOTE — Letter (Signed)
Summary: [facility]  [facility]   Imported By: Lennie Odor 11/10/2010 14:44:01  _____________________________________________________________________  External Attachment:    Type:   Image     Comment:   External Document

## 2011-01-20 NOTE — Letter (Signed)
Summary: Out of Work  [facility]  [address] [place], [place] [address]   Phone: [phone]  Fax: [phone]    February 10, 2010   Employee:  Lyda A Morissette    To Whom It May Concern:   For Medical reasons, please excuse the above named employee from work for the following dates:  Start: 02/10/10    End: 02/11/10, May return to work Wednesday 02/12/10    If you need additional information, please feel free to contact our office.         Sincerely,    Dr. Rene Paci

## 2011-01-20 NOTE — Assessment & Plan Note (Signed)
Summary: 2 mos f/u // #/ cd  RS'D PER PT/NWS   Vital Signs:  Patient profile:   55 year old female Height:      66 inches Weight:      325 pounds BMI:     52.65 O2 Sat:      93 % on Room air Temp:     98.2 degrees F oral Pulse rate:   94 / minute BP sitting:   138 / 74  (left arm) Cuff size:   large  Vitals Entered By: Bill Salinas CMA (June 27, 2010 2:02 PM)  O2 Flow:  Room air  Comments Pt also takes Voltarin and neurontin but unsure of the mg will call to update   Primary Care Provider:  Corwin Levins MD   History of Present Illness: /ere for yearly f/u; c/op no energy for months, tearful, frustrated but is getting over the knee surgury and not able to excercise but denies worsening depressive symtpoms, suicidal ideaiton or panic.  Pt denies CP, sob, doe, wheezing, orthopnea, pnd, worsening LE edema, palps, dizziness or syncope  Pt denies new neuro symptoms such as headache, facial or extremity weakness     Problems Prior to Update: 1)  Pain in Joint, Multiple Sites  (ICD-719.49) 2)  Acute Pharyngitis  (ICD-462) 3)  Peripheral Edema  (ICD-782.3) 4)  Rash-nonvesicular  (ICD-782.1) 5)  Paronychia, Finger  (ICD-681.02) 6)  Acute Lymphadenitis  (ICD-683) 7)  Chest Pain  (ICD-786.50) 8)  Peripheral Edema  (ICD-782.3) 9)  Sinusitis- Acute-nos  (ICD-461.9) 10)  Preventive Health Care  (ICD-V70.0) 11)  Mrsa  (ICD-041.12) 12)  Thoracic/lumbosacral Neuritis/radiculitis Unspec  (ICD-724.4) 13)  Shoulder Pain, Left  (ICD-719.41) 14)  Sinusitis- Acute-nos  (ICD-461.9) 15)  Rash-nonvesicular  (ICD-782.1) 16)  Preventive Health Care  (ICD-V70.0) 17)  Routine General Medical Exam@health  Care Facl  (ICD-V70.0) 18)  Diverticulosis, Schmader  (ICD-562.10) 19)  Gerd  (ICD-530.81) 20)  Depression  (ICD-311) 21)  Anxiety  (ICD-300.00) 22)  Low Back Pain  (ICD-724.2) 23)  Hypertension  (ICD-401.9) 24)  Back Pain  (ICD-724.5) 25)  Allergic Rhinitis  (ICD-477.9)  Medications Prior to  Update: 1)  Alprazolam 0.5 Mg  Tabs (Alprazolam) .Marland Kitchen.. 1 By Mouth Two Times A Day As Needed Nerves 2)  Percocet 5-325 Mg Tabs (Oxycodone-Acetaminophen) .Marland Kitchen.. 1po 3 Times A Day As Needed - To Fill June 10, 2010 3)  Furosemide 40 Mg Tabs (Furosemide) .Marland Kitchen.. 1 By Mouth Once Daily As Needed 4)  Allegra 180 Mg Tabs (Fexofenadine Hcl) .... Generic or Otc - 1 By Mouth Once Daily 5)  Nasonex 50 Mcg/act Susp (Mometasone Furoate) .... 2 Spray/side Once Daily  Current Medications (verified): 1)  Alprazolam 0.5 Mg  Tabs (Alprazolam) .Marland Kitchen.. 1 By Mouth Two Times A Day As Needed Nerves 2)  Percocet 5-325 Mg Tabs (Oxycodone-Acetaminophen) .Marland Kitchen.. 1po 3 Times A Day As Needed - To Fill July 10, 2010 3)  Furosemide 40 Mg Tabs (Furosemide) .Marland Kitchen.. 1 By Mouth Once Daily As Needed 4)  Allegra 180 Mg Tabs (Fexofenadine Hcl) .... Generic or Otc - 1 By Mouth Once Daily 5)  Nasonex 50 Mcg/act Susp (Mometasone Furoate) .... 2 Spray/side Once Daily 6)  Cymbalta 60 Mg Cpep (Duloxetine Hcl) 7)  Voltaren 8)  Neurontin Tid  Allergies (verified): 1)  ! Augmentin 2)  ! Codeine 3)  ! * Latex  Past History:  Family History: Last updated: 04/21/2010 daughter and sister with asthma mother with heart disease HTN, DM uncle with  [place] cancer grandmother iwth ovary cancer sister with fibromyalgia  Social History: Last updated: 12/28/2008 Alcohol use-yes Divorced 2 children work - Oncologist business Former Smoker - quit 10/09  Risk Factors: Smoking Status: quit (12/28/2008)  Past Medical History: Reviewed history from 02/07/2009 and no changes required. Allergic rhinitis Hypertension Low back pain spinal stenosis/DDD lumbar Anxiety Depression GERD Diverticulosis, [place] hx of MRSA leg abscess  Past Surgical History: Hysterectomy s/p ovary cyst s/p right knee arthroscopy - Dr Albesa Seen  Review of Systems  The patient denies anorexia, fever, weight loss, vision loss, decreased hearing, hoarseness,  chest pain, syncope, dyspnea on exertion, peripheral edema, prolonged cough, headaches, hemoptysis, abdominal pain, melena, hematochezia, severe indigestion/heartburn, hematuria, muscle weakness, suspicious skin lesions, transient blindness, difficulty walking, depression, abnormal bleeding, enlarged lymph nodes, and angioedema.         all otherwise negative per pt -    Physical Exam  General:  alert and overweight-appearing.   Head:  normocephalic and atraumatic.   Eyes:  vision grossly intact, pupils equal, and pupils round.   Ears:  R ear normal and L ear normal.   Nose:  no external deformity and no nasal discharge.   Mouth:  no gingival abnormalities and pharynx pink and moist.   Neck:  supple and no masses.   Lungs:  normal respiratory effort and normal breath sounds.   Heart:  normal rate and regular rhythm.   Abdomen:  soft, non-tender, and normal bowel sounds.   Msk:  no joint tenderness and no joint swelling.   Extremities:  no edema, no erythema  Neurologic:  cranial nerves II-XII intact and strength normal in all extremities.   Skin:  color normal and no rashes.   Psych:  dysphoric affect and moderately anxious.     Impression & Recommendations:  Problem # 1:  Preventive Health Care (ICD-V70.0) Overall doing well, age appropriate education and counseling updated and referral for appropriate preventive services done unless declined, immunizations up to date or declined, diet counseling done if overweight, urged to quit smoking if smokes , most recent labs reviewed and current ordered if appropriate, ecg reviewed or declined (interpretation per ECG scanned in the EMR if done); information regarding Medicare Prevention requirements given if appropriate; speciality referrals updated as appropriate   Problem # 2:  DEPRESSION (ICD-311)  Her updated medication list for this problem includes:    Alprazolam 0.5 Mg Tabs (Alprazolam) .Marland Kitchen... 1 by mouth two times a day as needed  nerves    Cymbalta 60 Mg Cpep (Duloxetine hcl) Continue all previous medications as before this visit - declines change in tx at this time  Problem # 3:  HYPERTENSION (ICD-401.9)  Her updated medication list for this problem includes:    Furosemide 40 Mg Tabs (Furosemide) .Marland Kitchen... 1 by mouth once daily as needed  BP today: 138/74 Prior BP: 132/82 (04/21/2010)  Labs Reviewed: K+: 4.3 (06/24/2010) Creat: : 0.8 (06/24/2010)   Chol: 221 (06/24/2010)   HDL: 42.70 (06/24/2010)   LDL: 108 (02/04/2009)   TG: 202.0 (06/24/2010) stable overall by hx and exam, ok to continue meds/tx as is   Complete Medication List: 1)  Alprazolam 0.5 Mg Tabs (Alprazolam) .Marland Kitchen.. 1 by mouth two times a day as needed nerves 2)  Percocet 5-325 Mg Tabs (Oxycodone-acetaminophen) .Marland Kitchen.. 1po 3 times a day as needed - to fill July 10, 2010 3)  Furosemide 40 Mg Tabs (Furosemide) .Marland Kitchen.. 1 by mouth once daily as needed 4)  Allegra 180 Mg Tabs (  Fexofenadine hcl) .... Generic or otc - 1 by mouth once daily 5)  Nasonex 50 Mcg/act Susp (Mometasone furoate) .... 2 spray/side once daily 6)  Cymbalta 60 Mg Cpep (Duloxetine hcl) 7)  Voltaren  8)  Neurontin Tid   Patient Instructions: 1)  plesae call for your yearly mammogram 2)  Continue all previous medications as before this visit  3)  please be as active as you can to help lose wt 4)  please keep your appt with Dr Dion Saucier as you have planned 5)  Please schedule a follow-up appointment in 1 year.or sooner if needed Prescriptions: PERCOCET 5-325 MG TABS (OXYCODONE-ACETAMINOPHEN) 1po 3 times a day as needed - to fill July 10, 2010  #90 x 0   Entered and Authorized by:   Corwin Levins MD   Signed by:   Corwin Levins MD on 06/27/2010   Method used:   Print then Give to Patient   RxID:   [id number] ALPRAZOLAM 0.5 MG  TABS (ALPRAZOLAM) 1 by mouth two times a day as needed nerves  #60 x 2   Entered and Authorized by:   Corwin Levins MD   Signed by:   Corwin Levins MD on 06/27/2010    Method used:   Print then Give to Patient   RxID:   [id number]

## 2011-01-20 NOTE — Letter (Signed)
Summary: [facility]  [facility]   Imported By: Sherian Rein 07/04/2010 11:13:23  _____________________________________________________________________  External Attachment:    Type:   Image     Comment:   External Document

## 2011-01-20 NOTE — Assessment & Plan Note (Signed)
Summary: F/U APPT/#CD   Vital Signs:  Patient profile:   55 year old female Height:      66 inches Weight:      318.25 pounds BMI:     51.55 O2 Sat:      97 % on Room air Temp:     98.5 degrees F oral Pulse rate:   80 / minute BP sitting:   132 / 82  (left arm) Cuff size:   large  Vitals Entered ByZella Ball Ewing (Apr 21, 2010 1:37 PM)  O2 Flow:  Room air CC: Shoulder, elbow and hip pain, refills/RE   Primary Care Provider:  Corwin Levins MD  CC:  Shoulder, elbow and hip pain, and refills/RE.  History of Present Illness: here with diffuse pain to the upper and lower back, as well as multiple joint areas including the shoudlers, elbows, hips and knees without obvious swelling, fever, trauma, injury, rash, wt loss, night sweats or other consituitonal symtpoms.  Has been gradual worse now mod to severe for 2 to 3 mo, constant and persistent, wiht some sleep disruption but no worsening daytime somnolence.  Wt overall stable., though not able to lose.  No falls or gait problem.  Pt denies CP, sob, doe, wheezing, orthopnea, pnd, worsening LE edema, palps, dizziness or syncope   Denies bowel or bladder changes, and Pt denies new neuro symptoms such as headache, facial or extremity weakness .  Has chronic LBP /lumbar disease.  Also more stress recetnly but denies worsening depressive symtpoms, suicidal ideation, or panic.  Overall good complaicne with meds, good tolerability.  Has also known right knee cartilage tear with effusion and ongoing pain wtih that as well - likely to have arthroscopic surgury soon under workman's comp, per dr Dion Saucier.  Requests percocet refill.   Also c/o incrased 2 to 3 wks nasal and sinus allergy symptoms without fever, headache, blurred visions, ST or cough alhtough she does have some difficulty clearing the throat with congestion and has some bialt ear fullness as well, without hearing loss, vertigo.    Problems Prior to Update: 1)  Pain in Joint, Multiple Sites   (ICD-719.49) 2)  Acute Pharyngitis  (ICD-462) 3)  Peripheral Edema  (ICD-782.3) 4)  Rash-nonvesicular  (ICD-782.1) 5)  Paronychia, Finger  (ICD-681.02) 6)  Acute Lymphadenitis  (ICD-683) 7)  Chest Pain  (ICD-786.50) 8)  Peripheral Edema  (ICD-782.3) 9)  Sinusitis- Acute-nos  (ICD-461.9) 10)  Preventive Health Care  (ICD-V70.0) 11)  Mrsa  (ICD-041.12) 12)  Thoracic/lumbosacral Neuritis/radiculitis Unspec  (ICD-724.4) 13)  Shoulder Pain, Left  (ICD-719.41) 14)  Sinusitis- Acute-nos  (ICD-461.9) 15)  Rash-nonvesicular  (ICD-782.1) 16)  Preventive Health Care  (ICD-V70.0) 17)  Routine General Medical Exam@health  Care Facl  (ICD-V70.0) 18)  Diverticulosis, Stcharles  (ICD-562.10) 19)  Gerd  (ICD-530.81) 20)  Depression  (ICD-311) 21)  Anxiety  (ICD-300.00) 22)  Low Back Pain  (ICD-724.2) 23)  Hypertension  (ICD-401.9) 24)  Back Pain  (ICD-724.5) 25)  Allergic Rhinitis  (ICD-477.9)  Medications Prior to Update: 1)  Alprazolam 0.5 Mg  Tabs (Alprazolam) .Marland Kitchen.. 1 By Mouth Two Times A Day As Needed Nerves 2)  Percocet 5-325 Mg Tabs (Oxycodone-Acetaminophen) .Marland Kitchen.. 1po Two Times A Day As Needed - To Fill Mar 26, 2010 3)  Furosemide 40 Mg Tabs (Furosemide) .Marland Kitchen.. 1 By Mouth Once Daily As Needed 4)  Hydromet 5-1.5 Mg/72ml Syrp (Hydrocodone-Homatropine) .... 5 Cc By Mouth Every 4-6 Hours As Needed For Mod-Severe Cough  Current Medications (  verified): 1)  Alprazolam 0.5 Mg  Tabs (Alprazolam) .Marland Kitchen.. 1 By Mouth Two Times A Day As Needed Nerves 2)  Percocet 5-325 Mg Tabs (Oxycodone-Acetaminophen) .Marland Kitchen.. 1po 3 Times A Day As Needed - To Fill Apr 21, 2010 3)  Furosemide 40 Mg Tabs (Furosemide) .Marland Kitchen.. 1 By Mouth Once Daily As Needed 4)  Allegra 180 Mg Tabs (Fexofenadine Hcl) .... Generic or Otc - 1 By Mouth Once Daily 5)  Nasonex 50 Mcg/act Susp (Mometasone Furoate) .... 2 Spray/side Once Daily  Allergies (verified): 1)  ! Augmentin 2)  ! Codeine 3)  ! * Latex  Past History:  Past Medical History: Last  updated: 02/07/2009 Allergic rhinitis Hypertension Low back pain spinal stenosis/DDD lumbar Anxiety Depression GERD Diverticulosis, Reckner hx of MRSA leg abscess  Past Surgical History: Last updated: 12/06/2007 Hysterectomy s/p ovary cyst  Social History: Last updated: 12/28/2008 Alcohol use-yes Divorced 2 children work - Oncologist business Former Smoker - quit 10/09  Risk Factors: Smoking Status: quit (12/28/2008)  Family History: Reviewed history from 02/07/2008 and no changes required. daughter and sister with asthma mother with heart disease HTN, DM uncle with Kubicki cancer grandmother iwth ovary cancer sister with fibromyalgia  Social History: Reviewed history from 12/28/2008 and no changes required. 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   Head:  normocephalic and atraumatic.   Eyes:  vision grossly intact, pupils equal, and pupils round.   Ears:  bilat tm's mild red, sinus nontender Nose:  nasal dischargemucosal pallor and mucosal edema.   Mouth:  pharyngeal erythema and fair dentition.   Neck:  supple and no masses.   Lungs:  normal respiratory effort and normal breath sounds.   Heart:  normal rate and regular rhythm.   Msk:  multiple tender points to bialt upper and lower back, as well as bilat subachrom bursa and bilat area greater trochanter;  knees and elbows nontender without effusions;  all joints without effusions and FROM Extremities:  no edema, no erythema    Impression & Recommendations:  Problem # 1:  PAIN IN JOINT, MULTIPLE SITES (ICD-719.49)  ok for incr in percocet for now;  check labs, refer rheum;  also ? FMS  Orders: TLB-Sedimentation Rate (ESR) (85652-ESR) TLB-Rheumatoid Factor (RA) (16109-UE) T-ANA Titer and Pattern  (45409-81191) T-dsDNA Assay [phone]) Rheumatology Referral (Rheumatology)  Problem # 2:  ALLERGIC RHINITIS (ICD-477.9)  flare - for allegra otc, and nasonex asd  Her updated medication list for this problem includes:    Allegra 180 Mg Tabs (Fexofenadine hcl) .Marland Kitchen... Generic or otc - 1 by mouth once daily    Nasonex 50 Mcg/act Susp (Mometasone furoate) .Marland Kitchen... 2 spray/side once daily  Problem # 3:  HYPERTENSION (ICD-401.9)  Her updated medication list for this problem includes:    Furosemide 40 Mg Tabs (Furosemide) .Marland Kitchen... 1 by mouth once daily as needed  BP today: 132/82 Prior BP: 136/72 (02/10/2010)  Labs Reviewed: K+: 4.4 (07/17/2009) Creat: : 0.8 (07/17/2009)   Chol: 177 (02/04/2009)   HDL: 57.7 (02/04/2009)   LDL: 108 (02/04/2009)   TG: 57 (02/04/2009) stable overall by hx and exam, ok to continue meds/tx as is   Complete Medication List: 1)  Alprazolam 0.5 Mg Tabs (Alprazolam) .Marland Kitchen.. 1 by mouth two times a day as needed nerves 2)  Percocet 5-325 Mg Tabs (Oxycodone-acetaminophen) .Marland Kitchen.. 1po 3 times a day as needed - to fill Apr 21, 2010 3)  Furosemide 40 Mg Tabs (Furosemide) .Marland Kitchen.. 1 by mouth once daily as needed 4)  Allegra 180 Mg Tabs (Fexofenadine hcl) .... Generic or otc - 1 by mouth once daily 5)  Nasonex 50 Mcg/act Susp (Mometasone furoate) .... 2 spray/side once daily  Patient Instructions: 1)  increase the percocet generic to 3 times per day 2)  Please take all new medications as prescribed  - the allegra and the naxonex 3)  Please go to the Lab in the basement for your blood tests today 4)  You will be contacted about the referral(s) to: rheumatology 5)  Please schedule a follow-up appointment in 2 months with CPX labs Prescriptions: NASONEX 50 MCG/ACT SUSP (MOMETASONE FUROATE) 2 spray/side once daily  #1 x 1   Entered and Authorized by:   Corwin Levins MD   Signed by:   Corwin Levins MD on 04/21/2010   Method used:   Print then Give to Patient   RxID:    [id number] ALLEGRA 180 MG TABS (FEXOFENADINE HCL) generic or otc - 1 by mouth once daily  #30 x 11   Entered and Authorized by:   Corwin Levins MD   Signed by:   Corwin Levins MD on 04/21/2010   Method used:   Print then Give to Patient   RxID:   [id number] PERCOCET 5-325 MG TABS (OXYCODONE-ACETAMINOPHEN) 1po 3 times a day as needed - to fill Apr 21, 2010  #90 x 0   Entered and Authorized by:   Corwin Levins MD   Signed by:   Corwin Levins MD on 04/21/2010   Method used:   Print then Give to Patient   RxID:   [id number]

## 2011-01-22 NOTE — Progress Notes (Signed)
Summary: Percocet  Phone Note Call from Patient Call back at Home Phone [phone]   Caller: Patient Summary of Call: Pt called requesting refill of Percocet Initial call taken by: Margaret Pyle, CMA,  January 01, 2011 11:44 AM    New/Updated Medications: PERCOCET 5-325 MG TABS (OXYCODONE-ACETAMINOPHEN) 1po 3 times a day as needed - to fill Jan 01, 2011 Prescriptions: PERCOCET 5-325 MG TABS (OXYCODONE-ACETAMINOPHEN) 1po 3 times a day as needed - to fill Jan 01, 2011  #90 x 0   Entered and Authorized by:   Corwin Levins MD   Signed by:   Corwin Levins MD on 01/01/2011   Method used:   Print then Give to Patient   RxID:   [id number]  done hardcopy to LIM side B - dahlia Corwin Levins MD  January 01, 2011 12:07 PM   Pt informed. Rx in cabinet for pt pick up Margaret Pyle, CMA  January 01, 2011 1:29 PM

## 2011-01-22 NOTE — Letter (Signed)
Summary: [facility]  [facility]   Imported By: Lennie Odor 01/14/2011 14:44:35  _____________________________________________________________________  External Attachment:    Type:   Image     Comment:   External Document

## 2011-02-04 ENCOUNTER — Telehealth: Payer: Self-pay | Admitting: Internal Medicine

## 2011-02-11 NOTE — Progress Notes (Signed)
Summary: RF  Phone Note Refill Request Call back at Home Phone [phone]   Refills Requested: Medication #1:  PERCOCET 5-325 MG TABS 1po 3 times a day as needed - to fill jan 12 Initial call taken by: Lamar Sprinkles, CMA,  February 04, 2011 5:42 PM    New/Updated Medications: PERCOCET 5-325 MG TABS (OXYCODONE-ACETAMINOPHEN) 1po 3 times a day as needed - to fill Feb 05, 2011 Prescriptions: PERCOCET 5-325 MG TABS (OXYCODONE-ACETAMINOPHEN) 1po 3 times a day as needed - to fill Feb 05, 2011  #90 x 0   Entered and Authorized by:   Corwin Levins MD   Signed by:   Corwin Levins MD on 02/05/2011   Method used:   Print then Give to Patient   RxID:   [id number]  done hardcopy to LIM side B - dahlia Corwin Levins MD  February 05, 2011 12:43 PM   Pt informed, Rx in cabinet for pt pick up Margaret Pyle, CMA  February 05, 2011 1:33 PM

## 2011-02-28 ENCOUNTER — Emergency Department (HOSPITAL_COMMUNITY)
Admission: EM | Admit: 2011-02-28 | Discharge: 2011-02-28 | Disposition: A | Discharge status: Home / Self Care | Payer: No Typology Code available for payment source | Attending: Emergency Medicine | Admitting: Emergency Medicine

## 2011-02-28 ENCOUNTER — Emergency Department (HOSPITAL_COMMUNITY): Payer: No Typology Code available for payment source

## 2011-02-28 DIAGNOSIS — IMO0001 Reserved for inherently not codable concepts without codable children: Secondary | ICD-10-CM | POA: Insufficient documentation

## 2011-02-28 DIAGNOSIS — S7000XA Contusion of unspecified hip, initial encounter: Secondary | ICD-10-CM | POA: Insufficient documentation

## 2011-02-28 DIAGNOSIS — IMO0002 Reserved for concepts with insufficient information to code with codable children: Secondary | ICD-10-CM | POA: Insufficient documentation

## 2011-03-02 ENCOUNTER — Encounter: Payer: Self-pay | Admitting: Internal Medicine

## 2011-03-05 LAB — POCT I-STAT, CHEM 8
Calcium, Ion: 1.09 mmol/L — ABNORMAL LOW (ref 1.12–1.32)
Glucose, Bld: 92 mg/dL (ref 70–99)
HCT: 45 % (ref 36.0–46.0)
Hemoglobin: 15.3 g/dL — ABNORMAL HIGH (ref 12.0–15.0)
TCO2: 27 mmol/L (ref 0–100)

## 2011-03-10 NOTE — Letter (Signed)
Summary: [facility]  [facility]   Imported By: Sherian Rein 03/05/2011 08:08:36  _____________________________________________________________________  External Attachment:    Type:   Image     Comment:   External Document

## 2011-03-13 ENCOUNTER — Other Ambulatory Visit: Payer: Self-pay

## 2011-03-13 NOTE — Telephone Encounter (Signed)
Pt called requesting refill of Percocet, last filled 02/05/2010

## 2011-03-13 NOTE — Telephone Encounter (Signed)
Pt advised of MD recommendations and expressed understanding

## 2011-03-13 NOTE — Telephone Encounter (Signed)
Recent orthopedic evaluation showed she felt Ok for tramadol;  I would try to cont that for now, and f/u with orthopedic if pain persists

## 2011-03-13 NOTE — Telephone Encounter (Signed)
Received in error.

## 2011-03-30 LAB — CARDIAC PANEL(CRET KIN+CKTOT+MB+TROPI)
CK, MB: 0.9 ng/mL (ref 0.3–4.0)
Troponin I: 0.01 ng/mL (ref 0.00–0.06)
Troponin I: 0.01 ng/mL (ref 0.00–0.06)

## 2011-03-30 LAB — LIPID PANEL
LDL Cholesterol: 87 mg/dL (ref 0–99)
Total CHOL/HDL Ratio: 5.5 RATIO
Triglycerides: 254 mg/dL — ABNORMAL HIGH (ref <150)
VLDL: 51 mg/dL — ABNORMAL HIGH (ref 0–40)

## 2011-03-30 LAB — CBC
Hemoglobin: 12.3 g/dL (ref 12.0–15.0)
MCHC: 33.8 g/dL (ref 30.0–36.0)
MCHC: 34.8 g/dL (ref 30.0–36.0)
MCV: 92.7 fL (ref 78.0–100.0)
MCV: 93.4 fL (ref 78.0–100.0)
Platelets: 288 10*3/uL (ref 150–400)
RBC: 3.54 MIL/uL — ABNORMAL LOW (ref 3.87–5.11)
RBC: 3.92 MIL/uL (ref 3.87–5.11)
RDW: 13.5 % (ref 11.5–15.5)
RDW: 13.7 % (ref 11.5–15.5)
WBC: 11.1 10*3/uL — ABNORMAL HIGH (ref 4.0–10.5)

## 2011-03-30 LAB — BASIC METABOLIC PANEL
BUN: 6 mg/dL (ref 6–23)
CO2: 31 mEq/L (ref 19–32)
Chloride: 105 mEq/L (ref 96–112)
Creatinine, Ser: 0.8 mg/dL (ref 0.4–1.2)
GFR calc Af Amer: >60 mL/min (ref >60)
Glucose, Bld: 102 mg/dL — ABNORMAL HIGH (ref 70–99)

## 2011-03-30 LAB — COMPREHENSIVE METABOLIC PANEL
ALT: 63 U/L — ABNORMAL HIGH (ref 0–35)
AST: 38 U/L — ABNORMAL HIGH (ref 0–37)
Albumin: 3.4 g/dL — ABNORMAL LOW (ref 3.5–5.2)
BUN: 7 mg/dL (ref 6–23)
CO2: 28 mEq/L (ref 19–32)
Calcium: 8.5 mg/dL (ref 8.4–10.5)
Calcium: 9.1 mg/dL (ref 8.4–10.5)
Chloride: 107 mEq/L (ref 96–112)
Creatinine, Ser: 0.79 mg/dL (ref 0.4–1.2)
GFR calc Af Amer: >60 mL/min (ref >60)
GFR calc non Af Amer: >60 mL/min (ref >60)
Glucose, Bld: 95 mg/dL (ref 70–99)
Sodium: 143 mEq/L (ref 135–145)
Total Bilirubin: 1.2 mg/dL (ref 0.3–1.2)
Total Protein: 6 g/dL (ref 6.0–8.3)

## 2011-03-30 LAB — CK TOTAL AND CKMB (NOT AT ARMC)
CK, MB: 1 ng/mL (ref 0.3–4.0)
Total CK: 26 U/L (ref 7–177)

## 2011-03-30 LAB — DIFFERENTIAL
Basophils Absolute: 0 10*3/uL (ref 0.0–0.1)
Basophils Absolute: 0.1 10*3/uL (ref 0.0–0.1)
Basophils Relative: 0 % (ref 0–1)
Basophils Relative: 1 % (ref 0–1)
Eosinophils Absolute: 0.3 10*3/uL (ref 0.0–0.7)
Eosinophils Absolute: 0.3 10*3/uL (ref 0.0–0.7)
Eosinophils Relative: 3 % (ref 0–5)
Monocytes Relative: 6 % (ref 3–12)
Neutrophils Relative %: 42 % — ABNORMAL LOW (ref 43–77)
Neutrophils Relative %: 44 % (ref 43–77)

## 2011-03-30 LAB — PROTIME-INR
INR: 1 (ref 0.00–1.49)
Prothrombin Time: 12.9 seconds (ref 11.6–15.2)

## 2011-03-30 LAB — BRAIN NATRIURETIC PEPTIDE: Pro B Natriuretic peptide (BNP): <30 pg/mL (ref 0.0–100.0)

## 2011-03-30 LAB — URINALYSIS, ROUTINE W REFLEX MICROSCOPIC
Bilirubin Urine: NEGATIVE
Glucose, UA: NEGATIVE mg/dL
Hgb urine dipstick: NEGATIVE
Ketones, ur: NEGATIVE mg/dL
Protein, ur: NEGATIVE mg/dL
Urobilinogen, UA: 0.2 mg/dL (ref 0.0–1.0)

## 2011-03-30 LAB — HEMOGLOBIN A1C: Mean Plasma Glucose: 108 mg/dL

## 2011-03-30 LAB — POCT CARDIAC MARKERS: Myoglobin, poc: 48.9 ng/mL (ref 12–200)

## 2011-03-30 LAB — GLUCOSE, CAPILLARY
Glucose-Capillary: 133 mg/dL — ABNORMAL HIGH (ref 70–99)
Glucose-Capillary: 140 mg/dL — ABNORMAL HIGH (ref 70–99)

## 2011-03-30 LAB — TROPONIN I: Troponin I: 0.02 ng/mL (ref 0.00–0.06)

## 2011-03-31 LAB — CBC
MCHC: 34.9 g/dL (ref 30.0–36.0)
Platelets: 301 10*3/uL (ref 150–400)
RBC: 4.44 MIL/uL (ref 3.87–5.11)

## 2011-03-31 LAB — URINALYSIS, ROUTINE W REFLEX MICROSCOPIC
Bilirubin Urine: NEGATIVE
Nitrite: NEGATIVE
Specific Gravity, Urine: 1.028 (ref 1.005–1.030)
Urobilinogen, UA: 0.2 mg/dL (ref 0.0–1.0)
pH: 6 (ref 5.0–8.0)

## 2011-03-31 LAB — BASIC METABOLIC PANEL
CO2: 28 mEq/L (ref 19–32)
Calcium: 9.2 mg/dL (ref 8.4–10.5)
Creatinine, Ser: 0.88 mg/dL (ref 0.4–1.2)
GFR calc Af Amer: >60 mL/min (ref >60)

## 2011-03-31 LAB — APTT: aPTT: 32 seconds (ref 24–37)

## 2011-03-31 LAB — PROTIME-INR: INR: 1 (ref 0.00–1.49)

## 2011-05-05 NOTE — Consult Note (Signed)
NAME:  Maria [name], Maria [name]               ACCOUNT NO.:  [id number]   MEDICAL RECORD NO.:  [id number]          PATIENT TYPE:  INP   LOCATION:  3002                         FACILITY:  [facility]   PHYSICIAN:  Michiel Cowboy, MDDATE OF BIRTH:  06/07/1956   DATE OF CONSULTATION:  05/30/2009  DATE OF DISCHARGE:                                 CONSULTATION   PRIMARY CARE PHYSICIAN:  Dr. Oliver Barre.   REQUESTING PHYSICIAN:  Dr. Phoebe Perch.   REASON FOR CONSULTATION:  Evaluation of syncope.   The patient is a 55 year old female with recent medical history  significant repeated admissions to neurosurgery service after  complications after undergoing initial anterior cervical decompression  and diskectomy and fusion, C5-C6 as well as C6-C7, for herniated nucleus  pulposus with __________ radiculopathy.  Apparently, the patient had  undergone this procedure on May 24.  On the May 25, she had cervical  hematoma which required exploration and wound evacuation.  She was  finally discharged on June 1, then readmitted on June 5 with another  hematoma.  She had undergone another decompression on June 5, and then  finally on June 9 was discharged to home.  She went home, felt fairly  okay but somewhat weak all over, went to bed, slept the majority of the  day, then finally got up to eat dinner.  Her sister was right next to  her.  The patient stood up from laying down in bed, took 1 step, and  then felt unwell.  She said she got slightly disoriented and seemed like  she was not sure where her feet were and then fell backwards onto the  bed, hitting her head.  She was conscious at all times.  No loss of  urine or stool incontinence.  No seizure-like activities.  She will lay  down in bed for a little while, then get up and start walking.  She  noticed that she started to have neck pain radiating down her spine  which is actually what alerted her family.  The pain persisted and  became severe enough to the  point where she started to cry and feel very  uncomfortable.  At that point, her family brought her into the emergency  room where a CT scan of her head and neck did not show any changes, only  improvement of her hematoma suggestive of draining procedure which she  did have.  The patient states that she does not have any chest pain or  shortness of breath.  She did not have any issues with wheezing.  She is  a little constipated but no diarrhea.  No urinary complaints.  No  fevers.  No chills.  Otherwise, review of systems is completely  unremarkable.  The patient never had any chest pains before the  operation.  Besides the neck pain, has actually been a fairly healthy  lady.  She states that she did not have a PT/OT evaluation prior to  discharge.  The patient endorses still some burning of her right leg as  well as sensation of numbness on the lateral aspect of her  right leg.  She says initially after her discharge to home actually both of her legs  felt somewhat numb, but now it is just in the right.   REVIEW OF SYSTEMS:  As above.  Otherwise negative.   SOCIAL HISTORY:  The patient used to smoke and use alcohol, but that was  in the remote past.  Nothing currently.  Lives at home.  Has a  supportive family.   FAMILY HISTORY:  No history of diabetes or cardiac disease.   ALLERGIES:  To AUGMENTIN, MORPHINE AND VICODIN.   MEDICATIONS:  1. Colace 100 mg p.o. daily.  2. Cyclobenzaprine 10 mg at bedtime.  3. Cipro 500 mg twice a day.  4. Percocet 1-2 tablets every 4 hours.   VITAL SIGNS:  Temperature 97.4, blood pressure 152/76, pulse 86,  respirations 15, saturating 99% on room air.  At this point, blood  pressure down to 137/71.   PHYSICAL EXAMINATION:  GENERAL:  The patient is an obese female in no  acute distress.  HEAD:  Nontraumatic, moist mucous membranes.  LUNGS:  Clear to auscultation bilaterally.  HEART:  Regular rate and rhythm.  No murmurs, rubs or gallops.   ABDOMEN:  Obese but nontender, nondistended. Bowel sounds present, soft.  EXTREMITIES:  Trace edema bilaterally equal.  Strength 5/5 in all 4  extremities.  Of note, the patient has been having some paresthesias and  decreased sensation on the outer aspect of her entire right leg as well  as burning on the top of her right foot which she report to the nurse  prior to discharge.  SKIN:  There are multiple bruises noted over the neck and chest but  otherwise unremarkable.   LABORATORY DATA:  White blood cell count 10.1, hemoglobin 11.5.  Sodium  141, potassium 3.6, creatinine 0.8, bicarb 31.  Cardiac enzymes:  Negative.  UA:  Unremarkable.  CT scan of the head unremarkable.  CT  scan of the neck showed decrease in size of hematoma.  Chest x-ray  showed mild cardiomegaly and mild reflexes but otherwise unremarkable.  EKG showing normal sinus rhythm.  The ST segment appeared to be mildly  sloped down in leads II, III, aVF, and perhaps also in V4 and V5 and  maybe in V6.  This does not appear to be severe.   ASSESSMENT AND RECOMMENDATIONS:  This is a 55 year old female with  recent admission to neurosurgery service for decompression of anterior  hematoma after an anterior approach to cervical spinal stenosis.  The  patient postoperatively was discharged to home and had an episode of  transient disorientation/weakness which caused her to fall slightly back  onto the bed without losing consciousness, not true syncope has  occurred.  The patient is having neurological complaints as well as neck  pain as well as some tingling in right leg.   RECOMMENDATIONS:  1. Fall.  Etiology is not quite clear.  It is possible the patient is      somewhat deconditioned after hospital stay and operation.  She does      not appear to be dehydrated on physical exam but will check      orthostatics.  If appears to be dehydrated, will give IV fluids.  I      would recommend PT/OT evaluation prior to discharge.   Given      neurologic complaints, would consider further imaging, question MRI      of the neck to evaluate any surgical changes.  Would defer to  primary team to evaluate for that.  2. Mild cardiomegaly on chest x-ray.  Will obtain 2-D echocardiogram      to evaluate this further.  I wonder if patient has history of sleep      apnea which could contribute to cardiomegaly at such a young age.      She may have had polysomnography test in the past and might benefit      from being on CPAP at bedtime.  We will also check TSH.  3. Nonspecific EKG changes in the setting of known chest pain.  No      shortness of breath.  Likely benign, but for completion's sake,      will cycle cardiac markers.  Check fasting lipid panel.  Will      observe patient on telemetry.  4. Morbid obesity.  Will check hemoglobin A1c as it puts her at risk      for diabetes for completion's sake.  5. Prophylaxis.  Protonix and SCDs.  Will avoid Lovenox as the patient      had a recent hematoma.   [facility] Team to follow up in the morning.      Michiel Cowboy, MD  Electronically Signed     AVD/MEDQ  D:  05/30/2009  T:  05/30/2009  Job:  [id number]   cc:   Clydene Fake, M.D.  Corwin Levins, MD

## 2011-05-05 NOTE — Op Note (Signed)
NAME:  Maria [name], Maria [name]               ACCOUNT NO.:  [id number]   MEDICAL RECORD NO.:  [id number]          PATIENT TYPE:  INP   LOCATION:  3112                         FACILITY:  [facility]   PHYSICIAN:  Clydene Fake, M.D.  DATE OF BIRTH:  [birth year]   DATE OF PROCEDURE:  05/14/2009  DATE OF DISCHARGE:                               OPERATIVE REPORT   PREOPERATIVE DIAGNOSES:  Cervical hematoma, anterior cervical fusion.   POSTOPERATIVE DIAGNOSES:  Cervical hematoma, anterior cervical fusion.   PROCEDURE:  Exploration of anterior cervical wound and evacuation of  hematoma, and Al Pimple drain placement.   SURGEON:  Clydene Fake, MD.   ASSISTANT:  Dr. Lovell Sheehan.   ANESTHESIA:  General endotracheal tube anesthesia.   BLOOD LOSS:  Minimal.   LIGAMENT:  None.   COMPLICATIONS:  None.   REASON FOR PROCEDURE:  The patient underwent ACF on May 13, 2009 without  any problems.  The next morning, the patient was a bit hoarse, some  dysphagia, we kept her in the hospital for observation.  This symptoms  worsened, had some drainage from the wound, the area was a little tight.  CT scan of the cervical region was done showing a hematoma of the  cervical exposure of the anterior cervical spine.  The patient brought  in for exploration and evacuation of hematoma.   PROCEDURE IN DETAIL:  The patient was brought to the operating room and  general anesthesia was induced.  The patient was able to be intubated,  and then the patient was prepped and draped in sterile fashion.  Previous incision was opened, and the anterior neck and the hematoma was  seen.  This was evacuated.  It was retracted and we could see the  anterior cervical plates and anterior cervical spine.  We irrigated with  Monsel's solution, and we had some further dissection of the anterior  cervical spine by the hematoma.  The bed was explored, and hematoma was  evacuated.  We irrigated with the Monsel's solution.  We had no  active  bleeding.  A JP drain was placed through a separate stab wound incision  down into the surgical bed to the anterior cervical spine.  We again  irrigated with  Monsel's solution.  We had good hemostasis, good evacuation of hematoma.  The platysma was closed 3-0 Vicryl interrupted sutures.  Subcutaneous  tissue closed with same.  Skin closed with benzoin, Steri-Strips,  dressing was placed.  The patient was placed in a soft cervical collar,  awoken from anesthesia, and transferred to recovery room.           ______________________________  Clydene Fake, M.D.     JRH/MEDQ  D:  05/14/2009  T:  05/15/2009  Job:  [id number]

## 2011-05-05 NOTE — Discharge Summary (Signed)
NAME:  Maria [name], Maria [name]               ACCOUNT NO.:  [id number]   MEDICAL RECORD NO.:  [id number]          PATIENT TYPE:  INP   LOCATION:  3032                         FACILITY:  [facility]   PHYSICIAN:  Clydene Fake, M.D.  DATE OF BIRTH:  Apr 22, 1956   DATE OF ADMISSION:  05/13/2009  DATE OF DISCHARGE:  05/21/2009                               DISCHARGE SUMMARY   DIAGNOSES:  Herniated nucleus pulposus, stenosis, cervical radiculopathy  at C5-6 and C6-7.   DISCHARGE DIAGNOSES:  Herniated nucleus pulposus, stenosis, cervical  radiculopathy at C5-6 and C6-7.   PROCEDURES:  1. Anterior cervical decompression, diskectomy, and fusion at C5-6 and      C6-7 with LifeNet bone and Trestle anterior cervical plate.  2. Exploration of wound and evacuation of hematoma.   REASON FOR ADMISSION:  The patient is a 55 year old woman who has had  neck and right arm pain, numbness and weakness, especially in the  biceps, and decreased sensation, right C6 distribution.  MRI was done  showing large central and right-sided disk herniation, C5-6, compression  of the spinal cord and bifemoral narrowing due to spondylitic  changes  at the level below.  The patient was brought in for 2-level ACF.   HOSPITAL COURSE:  The patient was admitted on the day of surgery and  underwent procedure without complications.  Postoperatively, she was  transferred to the recovery room and then to the floor.  Next day, due  to some slight drainage from the incision and some dysphagia as we  watched her that got a little bit worse, therefore a full CT scan of the  neck was done, showing a hematoma in the anterior cervical neck, and the  patient was taken back to the operating room.  Incision was opened,  large hematoma was found.  This was evacuated, no active bleeding was  found.  Drain was left in.  The patient was then transferred to the ICU  after that, had some minimal drainage for the next couple of days, the  third day that  was removed.  The patient continued doing well.  Dysphagia was improving.  She right from the first surgery had  resolution of arm pain and improving sensation and strength in that arm  and that continued to improve through her hospital stay.  Drain was  removed, slight drainage came from drain site, and dressing was changed  there, otherwise incision looked good, voice was improving, she was  swallowing better and better though it did take some time.  We added  some IV steroids  as well.  She was then switched to p.o. and was then  weaned off.  By May 21, 2009, she had troubled with trying to do meat  but otherwise swallowing all other things with just minimal difficulty,  able to get pills down, drinking well, is off IV fluids, up ambulating,  and moving around, and the patient was discharged home in stable  condition on May 21, 2009.   Discharge diagnoses same as above.  The patient improved.  Diet as  tolerated.  Follow up in 3  weeks.  Keep C-collar on at all times.  Change dressing as needed.  No strenuous activity.   MEDICATIONS:  Same as prehospitalization plus Percocet and Flexeril  p.r.n.          ______________________________  Clydene Fake, M.D.    JRH/MEDQ  D:  05/21/2009  T:  05/21/2009  Job:  [id number]

## 2011-05-05 NOTE — Op Note (Signed)
NAME:  Maria [name], Maria [name]               ACCOUNT NO.:  [id number]   MEDICAL RECORD NO.:  [id number]          PATIENT TYPE:  INP   LOCATION:  3108                         FACILITY:  [facility]   PHYSICIAN:  Clydene Fake, M.D.  DATE OF BIRTH:  [birth year]   DATE OF PROCEDURE:  05/25/2009  DATE OF DISCHARGE:                               OPERATIVE REPORT   PREOPERATIVE DIAGNOSIS:  Cervical hematoma instead of prior anterior  cervical fusion.   POSTOPERATIVE DIAGNOSIS:  Cervical hematoma instead of prior anterior  cervical fusion.   PROCEDURE:  Exploration of Intracervical wound and evacuation of  hematoma.   SURGEON:  Clydene Fake, MD   ASSISTANT:  Hilda Lias, MD   ANESTHESIA:  General endotracheal tube anesthesia.   ESTIMATED BLOOD LOSS:  Minimal.   BLOOD GIVEN:  None.   DRAINS:  A JP drain placed.   COMPLICATIONS:  None.   REASON FOR PROCEDURE:  The patient is a 55 year old woman with ACF done  11-12 days ago.  Two days postop, was taken back for evacuation of  hematoma, JP drain placed and was removed couple of days later.  The  patient is doing well.  There was ecchymosis around the incision, but  was watched and then discharged to room.  Four days ago, started having  some further drainage out of drains site, but a day ago, which  continues.  The patient was brought to the emergency room.  Unfortunately, we had a little more trouble following normal course at  this point, but it was subtle.  CT scan done showing residual recurrent  hematoma at the site.  The patient was brought in for exploration and  evacuation of hematoma again.   PROCEDURE IN DETAIL:  The patient was brought to the operative room and  general anesthesia was induced.  The patient was prepped and draped in  sterile fashion.  Incision was opened and the hematoma was seen,  evacuated and irrigated out.  No active bleeding was seen.  We irrigated  till irrigation came back clear.  JP drain were placed  through a  separate stab wound incision and sutured there with an alignment suture.  We still had a good hemostasis.  In the ER surgical medicine, platysma closed with 3-0 Vicryl and  interrupted sutures.  Subcutaneous tissue closed with same.  Skin closed  benzoin Steri-Strips, and was placed back again in a soft cervical  collar, awoken from anesthesia and transferred to recovery room.           ______________________________  Clydene Fake, M.D.     JRH/MEDQ  D:  05/25/2009  T:  05/26/2009  Job:  [id number]

## 2011-05-05 NOTE — Discharge Summary (Signed)
NAME:  Maria [name], Maria [name]               ACCOUNT NO.:  [id number]   MEDICAL RECORD NO.:  [id number]          PATIENT TYPE:  INP   LOCATION:  3040                         FACILITY:  [facility]   PHYSICIAN:  Clydene Fake, M.D.  DATE OF BIRTH:  1956/01/17   DATE OF ADMISSION:  05/25/2009  DATE OF DISCHARGE:  05/29/2009                               DISCHARGE SUMMARY   DIAGNOSIS:  Cervical hematoma, prior anterior cervical fusion.   DISCHARGE DIAGNOSIS:  Cervical hematoma, prior anterior cervical fusion.   PROCEDURE:  Exploration of cervical wound and evacuation of hematoma.   REASON FOR ADMISSION:  The patient is a 55 year old woman who had an ACF  done 4 days ago and a day and half after surgery had developed hematoma  in the neck and had that evacuated with a second surgery.  She had a  drain in that was removed.  She had done well and was discharged home 4  days prior to admission, and she came back because of some drainage from  the drain site with some increased hoarseness and potentially trouble  swallowing.  CT scan done of the neck showed recurrent hematoma at the  surgical site in the neck.  The patient was admitted for surgical  evacuation of hematoma.   HOSPITAL COURSE:  The patient was admitted and underwent surgery, which  was exploration of the cervical wound and evacuation of the hematoma.  No active bleeding was found and a drain was placed.  Postop, the  patient was doing well, was watched in the recovery room and then to the  ICU and was watched a couple of days there.  Minimal drainage was put  out.  She was transferred to the floor, the drain was left in, and it  was then removed on Tuesday, May 28, 2009.  She has done well with no  significant drainage coming from the drain site.  On May 29, 2009, her  voice was good, swallowing well, ecchymosis around the incision  resolved, and the patient was discharged home in stable condition.   DISCHARGE MEDICATIONS:  Same as  prehospitalization.  To finish the  course of Cipro that she was given prior to coming into the hospital.  Use Xanax on p.r.n. basis, and followup will be in 2-3 weeks.  Meanwhile, when she is at 3 weeks past the original surgery, she can  start weaning off the collar and slowly increase her activity.           ______________________________  Clydene Fake, M.D.     JRH/MEDQ  D:  05/29/2009  T:  05/29/2009  Job:  [id number]

## 2011-05-05 NOTE — H&P (Signed)
NAME:  Maria [name], Maria [name]               ACCOUNT NO.:  [id number]   MEDICAL RECORD NO.:  [id number]          PATIENT TYPE:  INP   LOCATION:  3108                         FACILITY:  [facility]   PHYSICIAN:  Hilda Lias, M.D.   DATE OF BIRTH:  May 23, 1956   DATE OF ADMISSION:  05/25/2009  DATE OF DISCHARGE:                              HISTORY & PHYSICAL   Maria [name] is a 55 year old lady who underwent fusion at the level of 5-6  and 6-7 by Dr. Phoebe Perch.  Four hours later, she was taken back to surgery  because of large hematoma.  The patient did well.  She was discharged  but since yesterday morning, she had been having quite a bit of bloody  drainage through the area where she had the drain.  When I talked to  her, I can hear that she has a raspy voice and I told her that she needs  to come immediately to the emergency room.  At the emergency, we did a  CT scan of the neck which showed a large hematoma in the surgical site.  Because of that, she is being admitted immediately for surgery.   PAST MEDICAL HISTORY:  Surgery at the level of 5-6 and 6-7 with fusion  followed by evacuation of hematoma.  She had a hysterectomy and  laparoscopy in 1999.   She is allergic to MORPHINE and AUGMENTIN.   This patient does not smoke and she did not smoke for almost a year.   REVIEW OF SYSTEMS:  Positive for difficulty swallowing and pain with  swallowing as well as change of bowels.   PHYSICAL EXAMINATION:  HEAD, EARS, NOSE, AND THROAT:  Normal.  NECK:  She has quite a bit ecchymosis that goes from the base of the  neck all the way down to the chest wall, all the way down to the  abdominal wall.  She has tenderness in the neck area.  The trachea is  deviated to the right side and she has quite a bit difficulty  swallowing.  LUNGS:  Clear.  HEART:  Heart sounds normal.  ABDOMEN:  Normal.  EXTREMITIES:  Normal pulses.  NEUROLOGIC:  Normal.   A CT scan shows hematoma in the left paracervical area with  displacement  of the trachea.   CLINICAL IMPRESSION:  Wound hematoma, cervical area.   RECOMMENDATIONS:  The patient is being admitted immediately to the OR.  I talked to her and her sister.  The procedure will be exploration of  the cervical wound.  Once this has been done, we are going to get  followup with hematologist for evaluation and see if there is any source  of why this lady has 2 major hematomas in less than 4 weeks.           ______________________________  Hilda Lias, M.D.     EB/MEDQ  D:  05/25/2009  T:  05/26/2009  Job:  [id number]

## 2011-05-05 NOTE — Op Note (Signed)
NAME:  Maria [name], Maria [name]               ACCOUNT NO.:  [id number]   MEDICAL RECORD NO.:  [id number]          PATIENT TYPE:  INP   LOCATION:  3534                         FACILITY:  [facility]   PHYSICIAN:  Clydene Fake, M.D.  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   PROCEDURE:  Anterior cervical decompression, diskectomy, and fusion C5-6  and C6-7 __________ anterior cervical plate.   SURGEON:  Clydene Fake, MD   ASSISTANT:  Danae Orleans. Venetia Maxon, MD   ANESTHESIA:  General endotracheal tube anesthesia.   ESTIMATED BLOOD LOSS:  Minimal.   BLOOD GIVEN:  None.   DRAINS:  None.   COMPLICATIONS:  None.   REASON FOR PROCEDURE:  The patient is a 55 year old woman who has had  severe neck and right arm pain, numbness and weakness on the right side  portion of __________ biceps, decreased sensation on right C6  distribution __________ .  An MRI was done showing some __________ with  large central right-side disk herniation at 5, 6 compressing the spinal  cord.  The lateral recess and nerve root and spinal changes, bifemoral  narrowing worse to the left at 6-7.  The patient brought for  decompression and fusion at these levels.   DESCRIPTION OF PROCEDURE:  The patient was brought to the operating room  and general anesthesia was induced.  The patient was placed in a 10-  pound Halter traction, prepped and draped in the sterile fashion.  Site  of incision injected with 10 mL of 1% lidocaine with epinephrine.  Incision was then made from the midline to the anterior border of the  sternocleidomastoid muscle on the left side.  The neck incision was  taken down to the platysma and  hemostasis was obtained with Bovie  cauterization.  The platysma muscle was incised with a Bovie and blunt  dissection taken through the anterior cervical fascia at the anterior  cervical spine.  Needle was placed in two interspaces, __________ due to  the patient's body habitus and shoulders.  Neck x-ray was obtained and  we could see the __________ was in 5-6, but could not see the bottle  needle well but that was __________ needles removed as the disk space at  C5-6 was incised with #15 blade and partial diskectomy.  Performed  longus coli muscles were reflected laterally with the Bovie and self-  retaining retractors were placed, so we can see 5-6 and 6-7 spaces,  anterior osteophytes removed __________ rongeurs and distraction pins  were placed in the C5-C7 interspace distracted.  Diskectomy was  continued at 4-5, 5-6, and 6-7 after incising the disk space.  Diskectomy with pituitary rongeurs and curettes.  Microscope was brought  in for microdissection at this point __________continued diskectomy and  then the posterior ligaments and 2-mm Kerrison punches were used to  remove posterior ligament, disk, and osteophytes were severely  thickened.  Posterior longitudinal ligaments were calcified, below this  was a huge mass of free fragmented disk __________ right side removed as  a free fragments and this did some decompressing of the spinal cord.  Bilateral foraminotomies were performed on the right side, more disk  material was out __________ removed that we got good decompression of  canal nerve roots.  Good hemostasis with Gelfoam and thrombin.  We used  high-speed drill to remove __________ measured height of disk space to  be 5 mm.  Hemostasis with Gelfoam and thrombin.  At C6-7, diskectomy  continued the same way, used 0.2-mm Kerrison punch.  The posterior disk  osteophyte ligament, we still calcified posterior longitudinal ligament  was thickened, this was removed and some free  fragments of disk from the  left side, these were removed with very good decompression of the thecal  sac and bilateral nerve roots.  Hemostasis obtained with Gelfoam and  thrombin.  High-speed drill was used __________ endplates.  Measured  height of disk space to be 6 mm.  Gelfoam and thrombin were removed at  both levels.  Irrigated with antibiotic solution with good hemostasis  and the corresponding allograft bone grafts were tapped into place  __________.  Distraction pins were removed __________ .  Weight was  removed from traction.  The bone was firmly in place, and the Trestle  anterior cervical plate was placed over the anterior cervical spine. C6,  C7 tightened down.  Lateral x-rays were obtained showing the top screws  __________, could not see anything __________ good positioning of plate  screws, and bone plugs.  Retractors were removed.  Irrigated with  antibiotic solution.  Hemostasis was obtained with Gelfoam and thrombin,  bipolar cauterization. __________  The platysma was closed with 3-0  Vicryl interrupted suture.  Subcutaneous tissue was closed with same.  Skin closed with benzoin and Steri-Strips.  Dressing was placed.  The  patient was placed in a soft cervical collar, awoken from anesthesia,  and transferred to recovery room in stable condition.           ______________________________  Clydene Fake, M.D.     JRH/MEDQ  D:  05/13/2009  T:  05/14/2009  Job:  [id number]

## 2011-05-08 NOTE — H&P (Signed)
[place]  Patient:    Maria [name], Maria [name]                      MRN: [id number] Adm. Date:  [id number] Attending:  Colbert Ewing                         History and Physical  PREOPERATIVE DIAGNOSIS:  Triggering A1 pulley, left thumb.  POSTOPERATIVE DIAGNOSIS:  Triggering A1 pulley, left thumb with markedly thickened A1 pulley.  OPERATION:   Release A1 pulley with partial excision, left thumb.  SURGEON:  Loreta Ave, M.D.  ANESTHESIA:  IV regional.  SPECIMENS:  None.  CULTURES:  None.  COMPLICATIONS:  None.  DRESSING:  Soft compressive with a bulky hand dressing.  DESCRIPTION OF PROCEDURE:  The patient was brought to the operating room and after adequate anesthesia had been obtained, prepped and draped in the usual sterile fashion.  Typical triggering A1 pulley, left thumb.  Exposed with a transverse incision protecting neurovascular structures.  Both neurovascular bundles identified, protected, and retracted.  A1 pulley was markedly thickened right over the front of the MP joint.  Incised in its entirety from proximal to distal to obliterate all triggering.  The markedly thickened pulley was also partially excised wherever thick anteriorly.  Some attritional fraying of the flexor tendon but basically intact.  All triggering obliterated at completion.  Wound irrigated and closed with mattress nylon sutures in skin.  Margins of wound were injected  with Marcaine without epinephrine.  Sterile compressive dressing with a bulky hand dressing.  Tourniquet deflated, anesthesia reversed, and brought to recovery room. She tolerated the surgery well with no complications. DD:  03/18/00 TD:  03/18/00 Job: [id number]

## 2011-05-08 NOTE — Discharge Summary (Signed)
NAME:  Maria [name], Maria [name]               ACCOUNT NO.:  [id number]   MEDICAL RECORD NO.:  [id number]          PATIENT TYPE:  INP   LOCATION:  1328                         [facility]ACILITY:  [facility]   PHYSICIAN:  Rene Paci, M.D. LHCDATE OF BIRTH:  [birth year]   DATE OF ADMISSION:  10/13/2005  DATE OF DISCHARGE:                                 DISCHARGE SUMMARY   DISCHARGE DIAGNOSES:  1.  Recurrent right lower extremity cellulitis improved with antibiotics.  2.  Onychomycosis.  3.  Pain in right lower extremity secondary to above.  4.  Obesity.  5.  Anxiety.   DISCHARGE MEDICATIONS:  1.  Keflex 500 mg p.o. q.i.d. x4 additional days to be followed then by      prophylactic Bactrim single-strength one p.o. daily until discontinued      by infectious disease.  2.  Lamisil 250 mg p.o. daily x212 weeks total.  3.  Percocet one to two p.o. q.4h. p.r.n. pain.   HOSPITAL FOLLOW-UP:  Will be with primary MD, Dr. Efrain Sella, in the next  week, scheduled for Tuesday, October 27, 2005, at 9:30 a.m. We will also  arrange follow-up with Dr. Johny Sax of infectious disease in the next  4-5 weeks to follow for question recurrence.   CONDITION ON DISCHARGE:  Medically stable, rash improved. Fever resolved,  tolerating pain with ambulation. Reviewed instructions for treatment and  follow-up with the patient who verbalizes understanding and agreement.   HOSPITAL COURSE BY PROBLEM:  RECURRENT RIGHT LOWER EXTREMITY CELLULITIS. The  patient is a pleasant woman who was hospitalized with her third recurrence  of cellulitis in her lower right leg near the ankle in the last 3 months.  She has previously had two outpatient courses of antibiotics with  fluoroquinolone and then doxycycline which showed resolution, but then  recurrence. Because of this most recent recurrence associated with pain and  concern for unusual organism, the patient was admitted for hospitalization  for IV pain control and IV antibiotics,  initially treated for the first day  with IV Avelox. Contacted infectious disease for evaluation assistance who  did an rheumatologic workup and with this could not find any other etiology;  specifically, lower extremity Dopplers were negative for DVT, lower  extremity ABIs normal wave forms without insufficiency, ANA negative, ANCA  negative. Herpetic varicella zoster antibody positive showing previous  infection, no significant to acute eruption. Dr. Ninetta Lights also recommended  treatment of her onychomycosis, cracking between the toes and toenails, for  12 weeks as this may be a precipitating factor for her recurrent cellulitis.  Otherwise, this patient has improved on IV antibiotics which were changed  from IV Avelox to Ancef with good resolution of her fever and normalization  of her white count. She will be treated for another 4 days on p.o. Keflex  which will complete a 10-day aggressive course, then prophylactic dose of  single-strength Bactrim once daily to hopefully prevent recurrence. Will see  infectious disease in the next 4 weeks to be arranged prior to discharge,  and then consideration of discontinuation of Bactrim. Would also consider a  dermatologic evaluation and/or biopsy should this recur again. Of note,  blood cultures were also negative x2.      Rene Paci, M.D. [facility]  Electronically Signed     VL/MEDQ  D:  10/19/2005  T:  10/19/2005  Job:  [id number]

## 2011-05-08 NOTE — Op Note (Signed)
[facility]  Patient:    Maria [name]                       MRN: [id number] Proc. Date: 12/01/99 Adm. Date:  [id number] Attending:  Donne Hazel                           Operative Report  PREOPERATIVE DIAGNOSES:       1. Right ovarian cyst.                               2. Pelvic pain.  POSTOPERATIVE DIAGNOSES:      1. Right ovarian cyst.                               2. Pelvic pain with pelvic adhesions.  OPERATION:                    1. Laparoscopy.                               2. Lysis of adhesions.                               3. Right ovarian cystectomy.  SURGEON:                      Willey Blade, M.D.  ANESTHESIA:                   General endotracheal.  ESTIMATED BLOOD LOSS:         Less than 20 cc.  FINDINGS:                     At the time of laparoscopy, the pelvis and abdomen were thoroughly visualized.  There were adhesions between the sigmoid Ballowe and the left pelvic side wall and vaginal cuff.  There was a small right paraovarian cyst, 1 x 1 cm in size, which was clear.  The left ovary was surgically absent.   The  uterus was surgically absent.  The appendix was not visualized.  DESCRIPTION OF PROCEDURE:     The patient was taken to the operating room where  general endotracheal anesthesia was administered.  The patient was placed on the operating table in the dorsal lithotomy position.  The abdomen, perineum and vagina were prepped and draped in the usual sterile fashion with Betadine and sterile drapes.  The bladder was emptied with a catheter.  All instruments and gloves were nonlatex.  The vagina was then packed with a sponge forcep with two sponges at he end.  Next, a small transverse infraumbilical skin incision was made through which a Verres needle was inserted atraumatically into the abdominal cavity.  Two liters of carbon dioxide gas was then used to insufflate the abdomen.  The  Verres needle was removed and a disposable laparoscopic trocar was inserted atraumatically into the abdominal cavity.  A laparoscope was then inserted with the above-noted findings.  An accessory trocar was placed two fingerbreadths above the pubic symphysis and in the midline.  This was done under direct, continuous laparoscopic visualization.  Lysis of adhesions was then carried  out between the left pelvic  side wall and vaginal cuff with bipolar cautery and laparoscopic scissors.  The  small right ovarian cyst was cauterized thoroughly with bipolar cautery.  Good hemostasis was noted from the operative areas.  The pelvis was thoroughly irrigated with copious amounts of irrigant and noted to be hemostatic.  The appendix was ot visualized.  Approximately 300 cc of Lactate and Ringers was placed inside the abdomen and pelvis to prevent future adhesions.  The gas was then allowed to escape.  All abdominal instruments were removed.  The fascia was then closed on the infraumbilical skin incision with two interrupted sutures of 0 Vicryl.  The skin was reapproximated with multiple interrupted sutures of 3-0 Vicryl repeat.  All  vaginal instruments were removed.  The patient was awakened, extubated and taken to the recovery room in good condition. DD:  12/01/99 TD:  12/01/99 Job: [id number]

## 2011-05-08 NOTE — Assessment & Plan Note (Signed)
[facility]                             PULMONARY OFFICE NOTE   NAME:Maria [name], Maria [name]                      MRN:          [id number]  DATE:03/22/2007                            DOB:          18-Mar-1956    HISTORY OF PRESENT ILLNESS:  The patient is a 55 year old female whom I  have been asked to see for possible sleep apnea.  The patient has been  noted to have snoring, but no one has ever mentioned pauses in her  bleeding during sleep.  She has an occasional choking arousal.  The  patient typically gets to bed between 11:00 and 12:00 and gets up  between 7:00 and 9:00 to start her day.  She is not rested upon arising.  The patient has noticed decreased concentration and memory during the  day with her work in the [employer].  She has noted significant  sleep pressure with periods of inactivity and has had some dozing with  TV and movies.  She has no difficulties with driving.  Of note, her  weight is up about 30 pounds over the last 2 years.   PAST MEDICAL HISTORY:  1. Allergic rhinitis.  2. Status post hysterectomy.  3. History of recent Staphylococcus infection for which she was      hospitalized in October 2007 involving her lower extremity.   CURRENT MEDICATIONS:  Zantac 300 mg daily.   The patient has GI stress related to AUGMENTIN use.  Otherwise, she has  no known drug allergies.   SOCIAL HISTORY:  She is divorced.  She has a history of smoking 1 pack  per day for 30 years.  She has not smoked in 5 years.   FAMILY HISTORY:  Remarkable for her daughter and sisters having asthma,  mother having heart disease, otherwise is noncontributory in first-  degree relatives.   REVIEW OF SYSTEMS:  As per history of present illness.  Also, see  patient intake form documented in the chart.   PHYSICAL EXAMINATION:  GENERAL:  She is a morbid obesity female in no  acute distress.  VITAL SIGNS:  Blood pressure is 136/80, pulse is 80, temperature  is 98,  weight is 277 pounds, O2 saturation on room air is 96%.  HEENT:  Pupils equal, round, and reactive to light and accommodation.  Extraocular muscles are intact.  Nares showed mild septal deviation to  the left.  Oropharynx shows moderate elongation of the soft palate and  uvula.  NECK:  Supple, without JVD or lymphadenopathy.  There is no palpable  thyromegaly.  CHEST:  Totally clear.  CARDIAC:  Reveals regular rate and rhythm.  No murmurs, rubs, or  gallops.  ABDOMEN:  Soft, nontender, with good bowel sounds.  GENITAL/RECTAL/BREAST:  Not done and not indicated.  LOWER EXTREMITIES:  Show trace edema.  Pulses are intact distally.  NEUROLOGIC:  Alert and oriented, with no obvious motor deficits.   IMPRESSION:  Probable obstructive sleep apnea.  The patient is  overweight, has an abnormal upper airway anatomy, and has a history that  is very suggestive for sleep disordered  breathing.  I had a long  discussion with her about sleep apnea as well as the short-term quality  of life issues and the long-term cardiovascular issues.  At this point  in time, I think she would benefit from nocturnal polysomnography.   PLAN:  1. Work on weight loss.  2. Schedule for nocturnal polysomnography.  3. The patient will follow up after the above.     Barbaraann Share, MD,FCCP  Electronically Signed    [facility]/MedQ  DD: 03/22/2007  DT: 03/23/2007  Job #: [id number]   cc:   Corwin Levins, MD

## 2011-05-08 NOTE — Op Note (Signed)
[facility]  Patient:    RENNEE COYNE                       MRN: [id number] Proc. Date: 12/01/99 Adm. Date:  [id number] Attending:  Donne Hazel                           Operative Report  PREOPERATIVE DIAGNOSIS:       1. Right ovarian cyst.                               2. Pelvic pain.  POSTOPERATIVE DIAGNOSIS:      1. Right ovarian cyst.                               2. Pelvic pain with pelvic adhesions.  OPERATION:                    1. Laparoscopy.                               2. Lysis of adhesions.                               3. Right ovarian cystectomy.  SURGEON:                      Willey Blade, M.D.  ANESTHESIA:                   General endotracheal.  ESTIMATED BLOOD LOSS:         Less than 20 cc.  COMPLICATIONS:                None.  FINDINGS:                     At the time of surgery, the abdomen and lower pelvis were visualized.  There were dense adhesions between the sigmoid Leming and the eft pelvic side wall.  There was a small right peritubal cyst, 1 x 1 cm in size, which was removed.  The appendix was not visualized.  DESCRIPTION OF PROCEDURE:     The patient was taken to the operating room where  general endotracheal anesthesia was administered.  The patient was placed in the dorsal lithotomy position.  The abdomen, perineum and vagina DD:  12/01/99 TD:  12/01/99 Job: [id number]

## 2011-05-08 NOTE — Discharge Summary (Signed)
NAME:  Maria [name], Maria [name]               ACCOUNT NO.:  [id number]   MEDICAL RECORD NO.:  [id number]          PATIENT TYPE:  INP   LOCATION:  3002                         FACILITY:  [facility]   PHYSICIAN:  Clydene Fake, M.D.  DATE OF BIRTH:  03/21/1956   DATE OF ADMISSION:  05/29/2009  DATE OF DISCHARGE:  06/03/2009                               DISCHARGE SUMMARY   ADMISSION DIAGNOSIS:  Cervical hematoma, status post anterior cervical  fusion and evacuation of hematoma with a fall here in a postoperative  period and generalized weakness.   DISCHARGE DIAGNOSIS:  Cervical hematoma, status post anterior cervical  fusion and evacuation of hematoma with a fall here in a postoperative  period and generalized weakness.   PROCEDURES:  None.   REASON FOR HOSPITALIZATION:  The patient is a 55 year old woman who is  discharged the day before after ACF, which was complicated by the  incisional hematomas x2, which was taken off at surgery both those times  for evacuation.  She did well and was sent home with headache and fall,  fell onto her bed, and possible near syncopal episodes and was followed  by the emergency room and readmitted for evaluation, medicine, and  consult was done for the near syncope.  The patient was neurologically  stable and was seen.  Incision was clean, dry, and intact.  We will  readmit her for observation.  CT head and CT C-spine were all negative.   HOSPITAL COURSE:  The patient had PT/OT work with her.  She increased  strength and they do not think there is any major medical issues there  and followed her through her hospital course.  The wound remained clean,  dry, and intact.  She is able to swallow.  She remained stable, slowly  build up her stamina.  She is ambulating well and voiced good.  Incision  remained clean, dry, and intact, and she was able to be discharged home  on June 03, 2009, in stable condition.   DISCHARGE PLAN:  Same as prehospitalization.  Follow up  in a couple of  weeks in my office.           ______________________________  Clydene Fake, M.D.     JRH/MEDQ  D:  06/20/2009  T:  06/21/2009  Job:  [id number]

## 2011-05-08 NOTE — Op Note (Signed)
[facility]  Patient:    Maria [name], Maria [name]                      MRN: [id number] Proc. Date: 02/18/00 Adm. Date:  [id number] Attending:  Colbert Ewing                           Operative Report  PREOPERATIVE DIAGNOSIS:  Triggering A1 pulley right thumb.  POSTOPERATIVE DIAGNOSIS:  Triggering A1 pulley right thumb.  PROCEDURE:  Release A1 pulley right thumb.  SURGEON:  Loreta Ave, M.D.  ASSISTANT:  Arlys [name] D. Petrarca, P.A.-C.  ANESTHESIA:  IV regional.  SPECIMENS:  None.  CULTURES:  None.  COMPLICATIONS:  None.  DRESSING:  Soft compressive with bulky dressing and splint.  DESCRIPTION OF PROCEDURE:  Patient brought to the operating room, placed on operating table in supine position.  After adequate anesthesia had been obtained, prepped and draped in usual sterile fashion.  A transverse incision over the A1  pulley.  Skin and subcutaneous tissue divided.  Neurovascular structures identified, retracted, including neurovascular bundles on both sides of the flexor tendon.  A1 pulley identified and incised in its entirety from proximal to distal extent.  Nodular swelling within the tendon from triggering appreciated.  No significant tearing in the tendon itself.  As she still had active flexion preoperative triggering it was evident it was obliterated after A1 pulley release. Wound was irrigated.  Small portion of the pulley excised to complete release.  Wound closed with nylon in skin avoiding neurovascular structures.   Margins of the wound injected with Marcaine.  Sterile compressive dressing and splint applied.  Anesthesia reversed, brought to recovery room.  Tolerated surgery well, no complications. DD:  02/18/00 TD:  02/18/00 Job: [id number]

## 2011-05-08 NOTE — Procedures (Signed)
NAME:  Maria [name], Maria [name]               ACCOUNT NO.:  [id number]   MEDICAL RECORD NO.:  [id number]          PATIENT TYPE:  OUT   LOCATION:  SLEEP CENTER                 FACILITY:  [facility]   PHYSICIAN:  Barbaraann Share, MD,FCCPDATE OF BIRTH:  April 05, 1956   DATE OF STUDY:  04/11/2007                            NOCTURNAL POLYSOMNOGRAM   REFERRING PHYSICIAN:  Barbaraann Share, MD,FCCP   INDICATION FOR STUDY:  Hypersomnia with sleep apnea.   EPWORTH SLEEPINESS SCORE:  11.   SLEEP ARCHITECTURE:  The patient had a total sleep time of 313 minutes  with only 60 minutes of REM and never achieved slow wave sleep.  Sleep  onset latency was normal and REM onset was normal as well.  Sleep  efficiency was decreased at 81%.   RESPIRATORY DATA:  The patient was found to have 20 hypopneas and 1  apnea for an apnea-hypopnea index of 4 events per hour.  The events were  clearly worse during supine sleep and also during REM.  There was  moderate to loud snoring noted throughout.   OXYGEN DATA:  There was O2 desaturation as low as 86% with the patient's  obstructive events.   CARDIAC DATA:  No clinically significant cardiac arrhythmias were noted.   MOVEMENT-PARASOMNIA:  The patient was found to have 294 leg jerks with  0.6 per hour resulting in arousal or awakening.   IMPRESSIONS-RECOMMENDATIONS:  1. Small numbers of obstructive events which do not meet the apnea-      hypopnea index criteria for the obstructive sleep apnea syndrome.      However, given the patient's symptoms and also snoring, I think the      degree of sleep apnea is being underestimated here, especially with      the patient never achieving slow wave sleep and had very little      REM.  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  Treatment at this point in time should focus on weight      loss, upper airway surgery, oral appliance or CPAP.  Clinical      correlation is suggested.  2. Very large numbers  of leg jerks with only mild sleep disruption.      Again, clinical correlation is suggested to see if the patient has      a history for the restless leg syndrome.      Barbaraann Share, MD,FCCP  Diplomate, American Board of Sleep  Medicine  Electronically Signed     [facility]/MEDQ  D:  04/27/2007 12:11:38  T:  04/27/2007 14:05:25  Job:  [id number]

## 2011-05-08 NOTE — H&P (Signed)
NAME:  Maria [name], Maria [name]               ACCOUNT NO.:  [id number]   MEDICAL RECORD NO.:  [id number]          PATIENT TYPE:  INP   LOCATION:  0102                         FACILITY:  [facility]   PHYSICIAN:  Corwin Levins, M.D. LHCDATE OF BIRTH:  [birth year]   DATE OF ADMISSION:  10/13/2005  DATE OF DISCHARGE:                                HISTORY & PHYSICAL   CHIEF COMPLAINT:  Here with rapid onset recurrent cellulitis symptoms of  right lower extremity with apparent rapid systemic effect.   HISTORY OF PRESENT ILLNESS:  Maria [name] is a 55 year old white female here  with a third episode of right lower extremity cellulitis in the past six  weeks.  She just finished another course of doxycycline five days ago.  Today awoke with slight redness and over the intervening six to eight hours  she has had a very rapid increase in erythema in the distal medial right  lower extremity with redness, swelling, tenderness, also some relatively  localized tenderness in the right groin and associated severe fevers and  chills.  She is __________ period of time.   PAST MEDICAL HISTORY:   ILLNESSES:  None.   SURGERIES:  Status post TAH with unilateral oophorectomy.   ALLERGIES:  OXYCONTIN, AUGMENTIN, MORPHINE SULFATE.   CURRENT MEDICATIONS:  None.   SOCIAL HISTORY:  Tobacco five cigarettes per day.  Alcohol:  None.  Divorced.  Two children.  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   FAMILY HISTORY:  Diabetes, heart disease, hypertension.   REVIEW OF SYSTEMS:  Otherwise noncontributory.   PHYSICAL EXAMINATION:  GENERAL:  Maria [name] is a 55 year old white female.  She is having several visible chills in the office with shaking.  VITAL SIGNS:  Blood pressure 154/88, respirations 20, pulse 118, temperature  103.1, weight 256.  HEENT:  Sclerae clear.  TMs clear.  Pharynx benign.  NECK:  Without lymphadenopathy, JVD, thyromegaly.  CHEST:  No rales, wheezing.  CARDIOVASCULAR:  Regular rate  and rhythm.  No murmur.  ABDOMEN:  Soft, nontender.  Positive bowel sounds.  No organomegaly or  masses.  EXTREMITIES:  No edema.  NEUROLOGIC:  Cranial nerves II-XII intact, otherwise nonfocal.  SKIN:  There is marked redness, erythema, and tenderness to the medial right  lower extremity above the ankle.  No ulceration.  There is also some  tenderness in the right groin, probable lymphadenopathy.  BREASTS:  Deferred.  PELVIC:  Deferred.  RECTAL:  Deferred.   ASSESSMENT/PLAN:  Cellulitis right lower extremity with rapid systemic  effect, probable early sepsis.  This is third recurrence in six weeks.  No  history of diabetes, venous insufficiency, or other immune compromise.  She  is to be admitted.  Take blood and urine cultures as well as routine  laboratories including CBC.  Start intravenous Avelox daily.  Consider ID  consult.           ______________________________  Corwin Levins, M.D. [facility]     JWJ/MEDQ  D:  10/13/2005  T:  10/13/2005  Job:  [id number]

## 2011-05-17 ENCOUNTER — Inpatient Hospital Stay (HOSPITAL_COMMUNITY)
Admission: EM | Admit: 2011-05-17 | Discharge: 2011-05-27 | DRG: 603 | Disposition: A | Discharge status: Home / Self Care | Payer: Self-pay | Attending: Internal Medicine | Admitting: Internal Medicine

## 2011-05-17 ENCOUNTER — Emergency Department (HOSPITAL_COMMUNITY): Payer: Self-pay

## 2011-05-17 DIAGNOSIS — D72829 Elevated white blood cell count, unspecified: Secondary | ICD-10-CM | POA: Diagnosis present

## 2011-05-17 DIAGNOSIS — E876 Hypokalemia: Secondary | ICD-10-CM | POA: Diagnosis present

## 2011-05-17 DIAGNOSIS — K5900 Constipation, unspecified: Secondary | ICD-10-CM | POA: Diagnosis not present

## 2011-05-17 DIAGNOSIS — L02419 Cutaneous abscess of limb, unspecified: Principal | ICD-10-CM | POA: Diagnosis present

## 2011-05-17 DIAGNOSIS — Z981 Arthrodesis status: Secondary | ICD-10-CM

## 2011-05-17 DIAGNOSIS — B372 Candidiasis of skin and nail: Secondary | ICD-10-CM | POA: Diagnosis present

## 2011-05-17 DIAGNOSIS — R0902 Hypoxemia: Secondary | ICD-10-CM | POA: Diagnosis not present

## 2011-05-17 DIAGNOSIS — E871 Hypo-osmolality and hyponatremia: Secondary | ICD-10-CM | POA: Diagnosis present

## 2011-05-17 DIAGNOSIS — D649 Anemia, unspecified: Secondary | ICD-10-CM | POA: Diagnosis present

## 2011-05-17 LAB — BASIC METABOLIC PANEL
CO2: 24 mEq/L (ref 19–32)
Calcium: 8.4 mg/dL (ref 8.4–10.5)
Chloride: 98 mEq/L (ref 96–112)
GFR calc Af Amer: >60 mL/min (ref >60)
Glucose, Bld: 113 mg/dL — ABNORMAL HIGH (ref 70–99)
Potassium: 3.4 mEq/L — ABNORMAL LOW (ref 3.5–5.1)
Sodium: 133 mEq/L — ABNORMAL LOW (ref 135–145)

## 2011-05-17 LAB — URINE MICROSCOPIC-ADD ON

## 2011-05-17 LAB — URINALYSIS, ROUTINE W REFLEX MICROSCOPIC
Bilirubin Urine: NEGATIVE
Nitrite: NEGATIVE
Specific Gravity, Urine: 1.026 (ref 1.005–1.030)
Urobilinogen, UA: 1 mg/dL (ref 0.0–1.0)
pH: 5.5 (ref 5.0–8.0)

## 2011-05-17 LAB — DIFFERENTIAL
Basophils Absolute: 0 10*3/uL (ref 0.0–0.1)
Basophils Relative: 0 % (ref 0–1)
Lymphocytes Relative: 12 % (ref 12–46)
Monocytes Absolute: 0.6 10*3/uL (ref 0.1–1.0)
Neutro Abs: 14.3 10*3/uL — ABNORMAL HIGH (ref 1.7–7.7)
Neutrophils Relative %: 84 % — ABNORMAL HIGH (ref 43–77)

## 2011-05-17 LAB — PROTIME-INR: INR: 1.21 (ref 0.00–1.49)

## 2011-05-17 LAB — CBC
HCT: 39.4 % (ref 36.0–46.0)
Hemoglobin: 13.5 g/dL (ref 12.0–15.0)
MCHC: 34.3 g/dL (ref 30.0–36.0)
RBC: 4.3 MIL/uL (ref 3.87–5.11)
WBC: 17 10*3/uL — ABNORMAL HIGH (ref 4.0–10.5)

## 2011-05-17 LAB — APTT: aPTT: 42 seconds — ABNORMAL HIGH (ref 24–37)

## 2011-05-18 LAB — BASIC METABOLIC PANEL
CO2: 23 mEq/L (ref 19–32)
Chloride: 102 mEq/L (ref 96–112)
GFR calc Af Amer: >60 mL/min (ref >60)
Glucose, Bld: 91 mg/dL (ref 70–99)
Sodium: 133 mEq/L — ABNORMAL LOW (ref 135–145)

## 2011-05-18 LAB — CBC
HCT: 36.3 % (ref 36.0–46.0)
Hemoglobin: 12.2 g/dL (ref 12.0–15.0)
RBC: 3.88 MIL/uL (ref 3.87–5.11)

## 2011-05-19 ENCOUNTER — Inpatient Hospital Stay (HOSPITAL_COMMUNITY): Payer: Self-pay

## 2011-05-19 LAB — COMPREHENSIVE METABOLIC PANEL
AST: 20 U/L (ref 0–37)
BUN: 13 mg/dL (ref 6–23)
CO2: 25 mEq/L (ref 19–32)
Chloride: 104 mEq/L (ref 96–112)
Creatinine, Ser: 0.93 mg/dL (ref 0.4–1.2)
GFR calc Af Amer: >60 mL/min (ref >60)
GFR calc non Af Amer: >60 mL/min (ref >60)
Glucose, Bld: 104 mg/dL — ABNORMAL HIGH (ref 70–99)
Total Bilirubin: 0.6 mg/dL (ref 0.3–1.2)

## 2011-05-19 LAB — CBC
HCT: 34.8 % — ABNORMAL LOW (ref 36.0–46.0)
Hemoglobin: 11.4 g/dL — ABNORMAL LOW (ref 12.0–15.0)
MCH: 30.9 pg (ref 26.0–34.0)
MCV: 94.3 fL (ref 78.0–100.0)
RBC: 3.69 MIL/uL — ABNORMAL LOW (ref 3.87–5.11)

## 2011-05-19 MED ORDER — GADOBENATE DIMEGLUMINE 529 MG/ML IV SOLN
20.0000 mL | Freq: Once | INTRAVENOUS | Status: AC | PRN
  Administered 2011-05-19: 20 mL via INTRAVENOUS

## 2011-05-20 DIAGNOSIS — L03119 Cellulitis of unspecified part of limb: Secondary | ICD-10-CM

## 2011-05-20 DIAGNOSIS — L02419 Cutaneous abscess of limb, unspecified: Secondary | ICD-10-CM

## 2011-05-20 LAB — BASIC METABOLIC PANEL
BUN: 7 mg/dL (ref 6–23)
CO2: 27 mEq/L (ref 19–32)
Chloride: 103 mEq/L (ref 96–112)
Creatinine, Ser: 0.69 mg/dL (ref 0.4–1.2)

## 2011-05-20 LAB — CBC
MCH: 31 pg (ref 26.0–34.0)
MCV: 94 fL (ref 78.0–100.0)
Platelets: 177 10*3/uL (ref 150–400)
RBC: 3.35 MIL/uL — ABNORMAL LOW (ref 3.87–5.11)

## 2011-05-21 LAB — BASIC METABOLIC PANEL
BUN: 6 mg/dL (ref 6–23)
CO2: 29 mEq/L (ref 19–32)
Chloride: 106 mEq/L (ref 96–112)
Glucose, Bld: 108 mg/dL — ABNORMAL HIGH (ref 70–99)
Potassium: 3.9 mEq/L (ref 3.5–5.1)

## 2011-05-21 LAB — CBC
HCT: 31.3 % — ABNORMAL LOW (ref 36.0–46.0)
Hemoglobin: 10.2 g/dL — ABNORMAL LOW (ref 12.0–15.0)
MCV: 94.6 fL (ref 78.0–100.0)
Platelets: 209 10*3/uL (ref 150–400)
RBC: 3.31 MIL/uL — ABNORMAL LOW (ref 3.87–5.11)
WBC: 10.3 10*3/uL (ref 4.0–10.5)

## 2011-05-21 LAB — CK: Total CK: 46 U/L (ref 7–177)

## 2011-05-22 NOTE — Consult Note (Signed)
NAME:  Maria [name], Maria [name]               ACCOUNT NO.:  [id number]  MEDICAL RECORD NO.:  [id number]           PATIENT TYPE:  I  LOCATION:  1309                         FACILITY:  [facility]  PHYSICIAN:  Adolph Pollack, M.D.DATE OF BIRTH:  1956-07-10  DATE OF CONSULTATION:  05/20/2011 DATE OF DISCHARGE:                                CONSULTATION   TIME OF CONSULTATION:  12:15 p.m.  REQUESTING PHYSICIAN:  Brendia Sacks, MD  CONSULTING SURGEON:  Adolph Pollack, MD  REASON FOR CONSULTATION:  Left lower extremity cellulitis, rule out necrotizing fasciitis.  HISTORY OF PRESENT ILLNESS:  Maria [name] is a 55 year old white female with a past medical history including morbid obesity as well as blood clots in her neck from cervical fusion, who developed nausea, vomiting, and diarrhea this past Friday afternoon until Saturday morning and Saturday during the day.  She did notice some left lower extremity discomfort on Friday but was wearing slit pants and did not look at her leg.  On Saturday, she noticed redness on her left lower extremity.  She woke up with chills, took her fever which was 100.7.  These symptoms persisted and therefore on Sunday the patient presented [facility] Emergency Department.  After her evaluation, it was found the patient had left lower extremity cellulitis.  The patient was admitted and placed on IV vancomycin and Avelox which has ultimately then changed to IV vancomycin and imipenem.  Initially, the patient had a white blood cell count of 17,000; however, this has dissipated to normal.  She did have fevers but these have also ceased.  The patient did undergo an MRI of the left lower extremity which revealed nonspecific left lower leg subcutaneous edema and enhancement consistent with cellulitis but no evidence of abscess, myofasciitis, or osteomyelitis.  Due to continued erythema and discomfort, we were asked to evaluate the patient to rule out necrotizing  fasciitis.  REVIEW OF SYSTEMS:  Please see HPI.  Otherwise, all other systems have been reviewed and are negative.  She does complain of some constipation since starting all the pain medicines.  The patient denies any trauma to her leg or bug bite.  FAMILY HISTORY:  Noncontributory.  PAST MEDICAL HISTORY: 1. Morbid obesity. 2. History of blood clots in her neck after cervical fusion.  PAST SURGICAL HISTORY: 1. Hysterectomy. 2. Right knee arthroscopy. 3. Multiple cervical surgeries. 4. Tonsillectomy.  SOCIAL HISTORY:  The patient is divorced.  She has 2 children.  She is currently on workman's comp.  She denies any alcohol or tobacco.  ALLERGIES: 1. AUGMENTIN. 2. MORPHINE. 3. LATEX. 4. HYDROCODONE.  MEDICATIONS AT HOME: 1. Voltaren. 2. Tramadol. 3. Alprazolam 0.5 mg daily.  PHYSICAL EXAMINATION:  GENERAL:  Maria [name] is a 55 year old morbidly obese white female who is currently lying in bed, in no acute distress. VITAL SIGNS:  Temperature 98.8, pulse 83, respirations 20, blood pressure 114/76. HEENT:  Head is normocephalic, atraumatic.  Sclerae noninjected.  Pupils are equal, round and reactive to light.  Ears and nose without any obvious masses or lesions.  No rhinorrhea.  Mouth is pink.  Throat shows no exudate. HEART:  Regular rate and rhythm.  Normal S1 and S2.  No murmurs, gallops, or rubs are noted.  She does have palpable carotid, radial and pedal pulses bilaterally. LUNGS:  Clear to auscultation bilaterally with no wheezes, rhonchi or rales noted.  Respiratory rate is nonlabored. ABDOMEN:  Soft, obese, nontender with no obvious masses or organomegaly noted. SKIN:  Left lower extremity reveals significant erythema and edema consistent with cellulitis.  This is almost circumferential to the left lower leg.  This is worse on the anterior, medial, and posterior portions of the leg with very minimal erythema on the lateral portion of her leg.  The erythema extends  from her ankle to her knee.  She does have a very good dorsalis pedis and posterior tibialis pulses bilaterally.  This area is very tender to touch.  There is no significant weeping of the leg at this point.  All other extremities are normal. PSYCHIATRIC:  The patient is alert and oriented x3 with an appropriate affect.  LABORATORY AND DIAGNOSTIC DATA:  Sodium 136, potassium 3.8, glucose 102, BUN 7, creatinine 0.69.  White blood cell count 8600, hemoglobin 10.4, hematocrit 31.5, platelet count is 177,000.  MRI of the leg revealed nonspecific lower leg subcutaneous edema and enhancement with left greater than right, appearance is consistent with cellulitis.  There is no evidence of abscess, myofasciitis, or osteomyelitis.  IMPRESSION: 1. Left lower extremity cellulitis. 2. Morbid obesity.  PLAN:  We agree with doing Dopplers to rule out DVT, as the cause for the patient's cellulitis.  We agree with keeping the patient on vancomycin as well as imipenem to continue treating the cellulitis.  The patient does not appear to have necrotizing fasciitis at this time. There is no surgical intervention planned or needed at this time.  We would recommend light pressure wrap to go on this leg to help with the edema.  In situations with this type of cellulitis, usually it takes a while for the erythema as well as the edema to dissipate, and suspect that will be the case here.  The patient did complain of some constipation and therefore we would add MiraLax while the patient is here.  Please call us if there are any further issues or questions.  Thank you for this consultation.     Letha Cape, PA   ______________________________ Adolph Pollack, M.D.    KEO/MEDQ  D:  05/20/2011  T:  05/20/2011  Job:  [id number]  cc:   Brendia Sacks, MD  Electronically Signed by Barnetta Chapel PA on 05/21/2011 81:19:14 PM Electronically Signed by Avel Peace M.D. on 05/22/2011 10:29:41 AM

## 2011-05-23 LAB — CULTURE, BLOOD (ROUTINE X 2)
Culture  Setup Time: 201205271735
Culture: NO GROWTH

## 2011-05-24 LAB — BASIC METABOLIC PANEL
GFR calc non Af Amer: >60 mL/min (ref >60)
Glucose, Bld: 106 mg/dL — ABNORMAL HIGH (ref 70–99)
Potassium: 3.6 mEq/L (ref 3.5–5.1)
Sodium: 143 mEq/L (ref 135–145)

## 2011-05-24 LAB — CULTURE, BLOOD (ROUTINE X 2)
Culture  Setup Time: 201205280955
Culture: NO GROWTH

## 2011-05-26 LAB — DIFFERENTIAL
Basophils Absolute: 0.1 10*3/uL (ref 0.0–0.1)
Basophils Relative: 1 % (ref 0–1)
Eosinophils Absolute: 0.5 10*3/uL (ref 0.0–0.7)
Lymphocytes Relative: 34 % (ref 12–46)
Neutrophils Relative %: 50 % (ref 43–77)

## 2011-05-26 LAB — CBC
HCT: 33.3 % — ABNORMAL LOW (ref 36.0–46.0)
Platelets: 453 10*3/uL — ABNORMAL HIGH (ref 150–400)
RDW: 12.8 % (ref 11.5–15.5)
WBC: 9 10*3/uL (ref 4.0–10.5)

## 2011-05-27 DIAGNOSIS — L02419 Cutaneous abscess of limb, unspecified: Secondary | ICD-10-CM

## 2011-05-27 DIAGNOSIS — L03119 Cellulitis of unspecified part of limb: Secondary | ICD-10-CM

## 2011-05-27 LAB — HIV 1/2 CONFIRMATION
HIV-1 antibody: NEGATIVE
HIV-2 Ab: NEGATIVE

## 2011-05-27 LAB — CREATININE, SERUM: GFR calc Af Amer: >60 mL/min (ref >60)

## 2011-05-28 NOTE — H&P (Signed)
NAME:  Maria [name], Maria [name]               ACCOUNT NO.:  [id number]  MEDICAL RECORD NO.:  [id number]           PATIENT TYPE:  O  LOCATION:  1309                         FACILITY:  [facility]  PHYSICIAN:  Osvaldo Shipper, MD     DATE OF BIRTH:  1956/04/30  DATE OF ADMISSION:  05/17/2011 DATE OF DISCHARGE:                             HISTORY & PHYSICAL   PRIMARY CARE PHYSICIAN:  Corwin Levins, MD  ADMISSION DIAGNOSES: 1. Left lower extremity cellulitis. 2. Dehydration. 3. Morbid obesity. 4. History of arthritis and chronic back pain.  CHIEF COMPLAINT:  Left leg pain.  HISTORY OF PRESENT ILLNESS:  The patient is a 55 year old Caucasian female who is morbidly obese who was in her usual state of health until Friday night when she started noticing that her left leg was hurting. This was followed by onset of chills.  She had episodes of diarrhea and an episode of vomiting.  Subsequently, she did not have any of these symptoms of diarrhea or vomiting.  On Saturday, she felt weak.  She wanted to just lie down on the bed and not get up.  She slept all day, however, her left leg continued to throb.  She also noticed redness in the lower part of the leg which started spreading upwards.  The pain in the left leg was described as a throbbing aching sensation, 8 out of 10 in intensity, with no real radiation.  The patient did not check her temperature at home.  Since her symptoms were getting worse, she decided to come to the hospital for further evaluation.  She tells me that she had infection and cellulitis of right lower extremity back in 2006 for which she had to be hospitalized.  The patient tried taking her tramadol with no relief.  MEDICATIONS AT HOME:  Includes the following: 1. Voltaren 50 mg 1 tablet daily. 2. Tramadol 50 mg 3 times a day. 3. Alprazolam 0.5 mg 1 tablet daily.  ALLERGIES:  Include AUGMENTIN which causes nausea and vomiting, MORPHINE causes migraine headaches and flu-like  symptoms and VICODIN causes hallucinations.  PAST MEDICAL HISTORY:  Positive for chronic back pain, obesity, arthritis in her knee.  She has had a hysterectomy.  She has had arthroscopic procedures to the right knee.  She has had cervical spine fusion surgery which was complicated by development of hematoma for which she required evacuation surgically twice.  This was back in 2010.  SOCIAL HISTORY:  She lives alone in [place].  Her daughter lives in [place].  She quit smoking many years ago but did not have any significant smoking use or tobacco use.  Alcohol, no intake.  Denies any illicit drug use.  She uses a cane to ambulate because of the arthritis in her right knee.  FAMILY HISTORY:  Father has hypertension and diabetes.  Her mother died of CHF.  She has got multiple siblings most of whom have diabetes.  REVIEW OF SYSTEMS:  GENERAL SYSTEM:  Positive for weakness, malaise. HEENT: Unremarkable.  CARDIOVASCULAR:  Unremarkable.  RESPIRATORY: Unremarkable.  GI: As in HPI.  GU: Unremarkable.  NEUROLOGICAL: Unremarkable.  PSYCHIATRIC:  Unremarkable.  MUSCULOSKELETAL:  As in HPI. Other systems reviewed and found to be negative.  PHYSICAL EXAMINATION:  VITAL SIGNS:  Temperature initially recorded at 102.5, subsequently 100.2; blood pressure 143/80, subsequently 126/62; heart rate initially 106, subsequently 80s and regular; respiratory rate is 18; saturation 97% on room air. GENERAL EXAM:  She is a morbidly obese white female, in no distress. HEENT:  Head is normocephalic, atraumatic.  Pupils are equal, reacting. No pallor, no icterus.  Oral mucous membranes dry.  No oral lesions are noted. NECK:  Soft and supple.  No thyromegaly is appreciated. LUNGS:  Clear to auscultation bilaterally with no wheezing, rales or rhonchi. CARDIOVASCULAR:  S1, S2 is normal.  Regular.  No S3, S4.  No rubs, murmurs or bruits.  She does has mild left inguinal lymphadenopathy. ABDOMEN:  Obese.  Vague  tenderness in the lower part of the abdomen was present. GU: Deferred. MUSCULOSKELETAL:  Her left leg is little bit swollen compared to the right.  She has got erythema in the lower legs, quite extensive and clearly and it spreads posteriorly as well.  Minimal erythema over the dorsal aspect of the foot.  Peripheral pulses are palpable.  There are no obvious wounds, no induration, no obvious abscesses noted.  The area is tender to palpation, is warm to touch.  Other skin exam of the lower abdominal folds reveal rash with central clearing. NEUROLOGIC:  Neurologically, she is alert, oriented x3.  No focal neurological deficits are present.  LABORATORY DATA:  Her white cell count is 17000 with 84% neutrophils. ESR is 26.  Her sodium is 133, potassium 3.4, bicarb is 24, chloride is 98, glucose 113, BUN is 19, creatinine is 1.14, calcium is 8.4.  Her UA shows amber and cloudy urine, trace blood and protein, negative for infection.  Blood cultures have been sent.  She had a chest x-ray which showed no acute abnormalities.  ASSESSMENT:  This is a 55 year old morbidly obese white female who presents with fever, chills, left leg pain and was found to have cellulitis involving the left lower extremity.  She is also dehydrated but her heart rate initially was tachycardic but now it is normal. Blood pressure is stable.  She also has sepsis.  PLAN: 1. Left lower extremity cellulitis with sepsis syndrome.  This will be     treated with intravenous antibiotics in the form of vancomycin and     Avelox.  Blood cultures will be followed up on.  At this time,     there is no indication for imaging studies of the lower     extremities. 2. Dehydration, will be treated with IV fluids. 3. Yeast infection of the lower abdominal folds, will be treated with     nystatin powder. 4. She is hypokalemic and hyponatremic for which she will be given     potassium chloride orally and normal saline in the form of  fluids.  DVT prophylaxis will be initiated.  Further management decisions will depend on results of further testing and patient's response to treatment.  Osvaldo Shipper, MD     GK/MEDQ  D:  05/17/2011  T:  05/17/2011  Job:  [id number]  Electronically Signed by Osvaldo Shipper MD on 05/28/2011 11:21:58 PM

## 2011-05-29 ENCOUNTER — Encounter: Payer: Self-pay | Admitting: Internal Medicine

## 2011-05-29 ENCOUNTER — Ambulatory Visit (INDEPENDENT_AMBULATORY_CARE_PROVIDER_SITE_OTHER): Payer: No Typology Code available for payment source | Admitting: Internal Medicine

## 2011-05-29 ENCOUNTER — Inpatient Hospital Stay (HOSPITAL_COMMUNITY)
Admission: AD | Admit: 2011-05-29 | Discharge: 2011-06-09 | DRG: 603 | Disposition: A | Discharge status: Home Health | Payer: No Typology Code available for payment source | Source: Ambulatory Visit | Attending: Internal Medicine | Admitting: Internal Medicine

## 2011-05-29 VITALS — BP 140/64 | HR 110 | Temp 97.9°F | Ht 66.0 in | Wt 331.4 lb

## 2011-05-29 DIAGNOSIS — Z833 Family history of diabetes mellitus: Secondary | ICD-10-CM

## 2011-05-29 DIAGNOSIS — K5900 Constipation, unspecified: Secondary | ICD-10-CM | POA: Diagnosis not present

## 2011-05-29 DIAGNOSIS — L03116 Cellulitis of left lower limb: Secondary | ICD-10-CM

## 2011-05-29 DIAGNOSIS — L02419 Cutaneous abscess of limb, unspecified: Secondary | ICD-10-CM

## 2011-05-29 DIAGNOSIS — E871 Hypo-osmolality and hyponatremia: Secondary | ICD-10-CM | POA: Diagnosis present

## 2011-05-29 DIAGNOSIS — B3731 Acute candidiasis of vulva and vagina: Secondary | ICD-10-CM | POA: Diagnosis not present

## 2011-05-29 DIAGNOSIS — M545 Low back pain, unspecified: Secondary | ICD-10-CM | POA: Diagnosis present

## 2011-05-29 DIAGNOSIS — D649 Anemia, unspecified: Secondary | ICD-10-CM | POA: Diagnosis present

## 2011-05-29 DIAGNOSIS — B373 Candidiasis of vulva and vagina: Secondary | ICD-10-CM | POA: Diagnosis not present

## 2011-05-29 DIAGNOSIS — L03119 Cellulitis of unspecified part of limb: Principal | ICD-10-CM | POA: Diagnosis present

## 2011-05-29 DIAGNOSIS — Z6841 Body Mass Index (BMI) 40.0 and over, adult: Secondary | ICD-10-CM

## 2011-05-29 DIAGNOSIS — Z0000 Encounter for general adult medical examination without abnormal findings: Secondary | ICD-10-CM | POA: Insufficient documentation

## 2011-05-29 DIAGNOSIS — I10 Essential (primary) hypertension: Secondary | ICD-10-CM | POA: Diagnosis present

## 2011-05-29 DIAGNOSIS — E876 Hypokalemia: Secondary | ICD-10-CM | POA: Diagnosis not present

## 2011-05-29 DIAGNOSIS — Z79899 Other long term (current) drug therapy: Secondary | ICD-10-CM

## 2011-05-29 DIAGNOSIS — I872 Venous insufficiency (chronic) (peripheral): Secondary | ICD-10-CM | POA: Diagnosis present

## 2011-05-29 DIAGNOSIS — G8929 Other chronic pain: Secondary | ICD-10-CM | POA: Diagnosis present

## 2011-05-29 DIAGNOSIS — E669 Obesity, unspecified: Secondary | ICD-10-CM | POA: Diagnosis present

## 2011-05-29 LAB — BASIC METABOLIC PANEL
BUN: 7 mg/dL (ref 6–23)
CO2: 24 mEq/L (ref 19–32)
Chloride: 99 mEq/L (ref 96–112)
Creatinine, Ser: 0.99 mg/dL (ref 0.4–1.2)

## 2011-05-29 LAB — CBC
HCT: 34.1 % — ABNORMAL LOW (ref 36.0–46.0)
MCHC: 32.3 g/dL (ref 30.0–36.0)
MCV: 93.7 fL (ref 78.0–100.0)
RDW: 13 % (ref 11.5–15.5)

## 2011-05-29 NOTE — Patient Instructions (Signed)
Please go directly to [facility];   You are being "Direct Admitted" , which means you don't have to go to the ER, and Dr Rama states she will arrange with addmisions personnel to assign a bed, where a Doctor will see you there.

## 2011-05-30 ENCOUNTER — Inpatient Hospital Stay (HOSPITAL_COMMUNITY): Payer: No Typology Code available for payment source

## 2011-05-30 DIAGNOSIS — L0390 Cellulitis, unspecified: Secondary | ICD-10-CM

## 2011-05-30 DIAGNOSIS — L0291 Cutaneous abscess, unspecified: Secondary | ICD-10-CM

## 2011-05-31 ENCOUNTER — Encounter: Payer: Self-pay | Admitting: Internal Medicine

## 2011-05-31 LAB — BASIC METABOLIC PANEL
Calcium: 8.5 mg/dL (ref 8.4–10.5)
Creatinine, Ser: 1.03 mg/dL (ref 0.4–1.2)
GFR calc non Af Amer: 56 mL/min — ABNORMAL LOW (ref >60)
Glucose, Bld: 95 mg/dL (ref 70–99)
Sodium: 138 mEq/L (ref 135–145)

## 2011-05-31 LAB — CBC
Hemoglobin: 10.5 g/dL — ABNORMAL LOW (ref 12.0–15.0)
MCH: 30 pg (ref 26.0–34.0)
MCHC: 31.5 g/dL (ref 30.0–36.0)
MCV: 95.1 fL (ref 78.0–100.0)
Platelets: 326 10*3/uL (ref 150–400)

## 2011-05-31 NOTE — Assessment & Plan Note (Signed)
Mild but significant recurrence on bactrim/amoxil;  I would consider this outpt antibx failure, have d/w Dr Darnelle Catalan, and I recommended pt for repeat hospn - pt to be direct admit to [facility], Dr Rama to make arrangements for bed and MD to see;  No further eval or tx at this time as pt is direct to hospital;  Pt agrees

## 2011-05-31 NOTE — Progress Notes (Signed)
Subjective:    Patient ID: Maria [name], female    DOB: 23-Aug-1956, 55 y.o.   MRN: [id number]  HPI  Here s/p recent d/c for LLE cellulitis, asked per Dr Rama/hospitalist to see me today, to avoid the ER but still have evaluate pt concern of recurrent cellultiis;  Pt herself quite tearful and emotional, and relates the feels she is worse due to onset chills again this am as prior to last hospn, as well as new blister like area mid right anterior leg with increased quite tender red/swelling that was was not there at d/c, as well as the more diffuse area post distal LLE above the ankle, but again without drainage.  No high fever but has been warm and no thermometer at home.  No other acute complaints.   Has not been here to see me recently due to no insurance. Has hx of MRSA Past Medical History  Diagnosis Date  . Acute lymphadenitis 08/13/2009  . Acute pharyngitis 02/10/2010  . ALLERGIC RHINITIS 08/10/2007  . ANXIETY 12/06/2007  . BACK PAIN 12/06/2007  . CHEST PAIN 08/13/2009  . DEPRESSION 12/06/2007  . DIVERTICULOSIS, [place] 12/06/2007  . GERD 12/06/2007  . HYPERTENSION 12/06/2007  . LOW BACK PAIN 12/06/2007  . MRSA 02/07/2009  . Pain in joint, multiple sites 04/21/2010  . PARONYCHIA, FINGER 08/13/2009  . RASH-NONVESICULAR 05/09/2008  . SHOULDER PAIN, LEFT 05/09/2008  . SINUSITIS- ACUTE-NOS 05/09/2008  . THORACIC/LUMBOSACRAL NEURITIS/RADICULITIS UNSPEC 12/28/2008   Past Surgical History  Procedure Date  . Abdominal hysterectomy   . S/p ovary cyst   . S/p right knee arthroscopy     Dr. Dion Saucier ortho    reports that she has quit smoking. She does not have any smokeless tobacco history on file. She reports that she drinks alcohol. Her drug history not on file. family history includes Asthma in her daughter and sister; Cancer in her maternal uncle and other; Diabetes in her mother; Heart disease in her mother; and Hypertension in her mother. Allergies  Allergen Reactions  . Codeine   .  BJY:NWGNFAOZHYQ+MVHQIONGE+XBMWUXLKGM Acid+Aspartame   . Latex    No current outpatient prescriptions on file prior to visit.    Review of Systems All otherwise neg per pt     Objective:   Physical Exam BP 140/64  Pulse 110  Temp(Src) 97.9 F (36.6 C) (Oral)  Ht 5\' 6"  (1.676 m)  Wt 331 lb 6 oz (150.311 kg)  BMI 53.49 kg/m2  SpO2 95% Physical Exam  VS noted Constitutional: Pt appears well-developed and well-nourished.  HENT: Head: Normocephalic.  Right Ear: External ear normal.  Left Ear: External ear normal.  Eyes: Conjunctivae and EOM are normal. Pupils are equal, round, and reactive to light.  Neck: Normal range of motion. Neck supple.  Cardiovascular: Normal rate and regular rhythm.   Pulmonary/Chest: Effort normal and breath sounds normal.  Neurological: Pt is alert. No cranial nerve deficit.  Skin: Skin to LEs has chronic stasis dermatitis skin brawyny changes and probable at least trace chronic edema;  But LLE worse with 1-2+ current edema, mid left leg anterior 1 cm area pustular like but superfical lesion without drainage, but with surrounding marked tender for approx 2 cm, no definite red streaks I can appreciate;  Does also have worsening per pt new warm/red/tender diffusely nondiscrete about 4 cm area post left distal leg, also without drainage or fluctuance Psychiatric: Pt behavior is normal. Thought content normal.         Assessment & Plan:

## 2011-06-02 ENCOUNTER — Inpatient Hospital Stay (HOSPITAL_COMMUNITY): Payer: No Typology Code available for payment source

## 2011-06-02 DIAGNOSIS — L03119 Cellulitis of unspecified part of limb: Secondary | ICD-10-CM

## 2011-06-02 DIAGNOSIS — L02419 Cutaneous abscess of limb, unspecified: Secondary | ICD-10-CM

## 2011-06-02 MED ORDER — GADOBENATE DIMEGLUMINE 529 MG/ML IV SOLN
20.0000 mL | Freq: Once | INTRAVENOUS | Status: AC | PRN
  Administered 2011-06-02: 20 mL via INTRAVENOUS

## 2011-06-03 ENCOUNTER — Ambulatory Visit: Payer: No Typology Code available for payment source | Admitting: Internal Medicine

## 2011-06-03 LAB — CRYPTOCOCCAL ANTIGEN: Crypto Ag: NEGATIVE

## 2011-06-03 LAB — BASIC METABOLIC PANEL
CO2: 30 mEq/L (ref 19–32)
Calcium: 8.9 mg/dL (ref 8.4–10.5)
Chloride: 102 mEq/L (ref 96–112)
Creatinine, Ser: 0.62 mg/dL (ref 0.4–1.2)
Glucose, Bld: 98 mg/dL (ref 70–99)
Sodium: 138 mEq/L (ref 135–145)

## 2011-06-03 LAB — CBC
Hemoglobin: 10.8 g/dL — ABNORMAL LOW (ref 12.0–15.0)
MCH: 30.2 pg (ref 26.0–34.0)
MCV: 94.1 fL (ref 78.0–100.0)
Platelets: 309 10*3/uL (ref 150–400)
RBC: 3.58 MIL/uL — ABNORMAL LOW (ref 3.87–5.11)
WBC: 4.2 10*3/uL (ref 4.0–10.5)

## 2011-06-03 NOTE — Group Therapy Note (Signed)
NAME:  Maria [name], HAPPEL NO.:  [id number]  MEDICAL RECORD NO.:  [id number]  LOCATION:  1310                         FACILITY:  [facility]  PHYSICIAN:  Hillery Aldo, M.D.   DATE OF BIRTH:  [birth year]                                PROGRESS NOTE   PRIMARY CARE PHYSICIAN: Corwin Levins, MD.  CURRENT DIAGNOSES: 1. Persistent left lower extremity cellulitis. 2. Anemia of acute illness. 3. Obesity. 4. Constipation. 5. Vaginal candidiasis.  DISCHARGE MEDICATIONS: Discharge medications will be dictated at the time of actual discharge.  CONSULTATIONS: Lacretia Leigh. Ninetta Lights, M.D. of Infectious Disease.  PROCEDURES AND DIAGNOSTIC STUDIES: 1. Chest x-ray on May 30, 2011 status post placement of a peripherally     inserted central catheter showed the catheter tip overlying mid     SVC.  Otherwise stable chest radiograph with upper limits of normal     heart size and pulmonary vascular congestion. 2. Repeat MRI of the left tibia-fibula ordered but not yet performed     on June 02, 2011.  DISCHARGE LABORATORY VALUES: Will be dictated time of actual discharge.  BRIEF ADMISSION HISTORY OF PRESENT ILLNESS: The patient is a 55 year old female who originally presented to the hospital on May 17, 2011 for treatment of left lower extremity cellulitis.  The patient was put on a combination of vancomycin, Avelox and due to persistent disease, she was seen by infectious diseases.  Her antibiotics were changed to vancomycin and Primaxin and she completed 10 days of IV antibiotic therapy, at which time she was discharged home on a combination of Septra and amoxicillin per infectious disease recommendations.  Of note, during her hospital stay, she was HIV tested and her initial HIV screening was positive but confirmatory tests were negative.  The patient was home for approximately 48 hours and reported that her left lower extremity continued to be erythematous with a new blister  formation on the anterior side and no improvement, so she was seen by her primary care physician who felt that the patient would need re-admission for further IV antibiotic therapy.  She was directly admitted to the hospital on May 29, 2011 and immediately put on a combination of Unasyn and vancomycin.  For full details, please see the dictated report done by Dr. Butler Denmark.  HOSPITAL COURSE BY PROBLEM: 1. Persistent cellulitis:  The patient was maintained on Unasyn and     vancomycin.  Infectious diseases was reconsulted and the patient     was seen by Dr. Johny Sax who switched her antibiotics to     vancomycin and cefepime.  Clindamycin was added per infectious     disease recommendations on May 31, 2011.  She was seen by Dr. Daiva Eves of infectious diseases on June 01, 2011 and he discontinued     cefepime and started her on imipenem.  He also recommended a repeat     MRI scan of the left lower extremity which is pending at the time     of this dictation.  If the MRI is negative, would recommend     surgical evaluation and biopsy of the skin to determine the  affecting organism.  If the MRI is positive for abscess, she will     need surgical drainage.  At this point, the patient has failed to     improve with very extensive and potent IV antibiotics but is     continued to be followed by the infectious disease specialist. 2. Anemia of acute illness:  The patient's hemoglobin and hematocrit     have been relatively stable while in the hospital. 3. Obesity:  The patient's declines a heart-healthy diet at this time. 4. Constipation:  The patient was ordered MiraLax p.r.n. 5. Vaginal candidiasis:  The patient was given a dose of Diflucan and     put on nightly Monistat.  DISPOSITION: The patient is not yet medically stable for discharge.  A discharge summary addendum will be dictated at the time of actual discharge.     Hillery Aldo, M.D.     CR/MEDQ  D:  06/02/2011   T:  06/02/2011  Job:  [id number]  cc:   Corwin Levins, MD [address] [place] [address]  Electronically Signed by Hillery Aldo M.D. on 06/03/2011 12:44:25 PM

## 2011-06-04 LAB — BASIC METABOLIC PANEL
CO2: 33 mEq/L — ABNORMAL HIGH (ref 19–32)
Chloride: 99 mEq/L (ref 96–112)
GFR calc Af Amer: >60 mL/min (ref >60)
Potassium: 3.6 mEq/L (ref 3.5–5.1)

## 2011-06-05 LAB — CULTURE, BLOOD (ROUTINE X 2)
Culture  Setup Time: 201206090204
Culture: NO GROWTH

## 2011-06-05 NOTE — Discharge Summary (Signed)
NAME:  Maria [name], Maria [name] NO.:  [id number]  MEDICAL RECORD NO.:  [id number]  LOCATION:  1309                         FACILITY:  [facility]  PHYSICIAN:  Brendia Sacks, MD    DATE OF BIRTH:  01/30/1956  DATE OF ADMISSION:  05/17/2011 DATE OF DISCHARGE:  05/27/2011                        DISCHARGE SUMMARY - REFERRING   TENTATIVE DATE OF DISCHARGE:  May 27, 2011.  CONDITION ON DISCHARGE:  Improved.  DISPOSITION:  Home with Physical Therapy.  PRIMARY CARE PHYSICIAN:  Oliver Barre, MD  DISCHARGE DIAGNOSES: 1. Left lower extremity cellulitis. 2. Anemia, stable. 3. False-positive HIV screen.  The patient does not have HIV. 4. Thrush.  HISTORY OF PRESENT ILLNESS:  This is a 55 year old woman who presented to the emergency room with left leg pain and was found to have left lower extremity cellulitis.  HOSPITAL COURSE: 1. Left lower extremity cellulitis:  The patient was placed on broad-     spectrum antibiotic therapy.  She has been very slow to improve and     likely complicated by morbid obesity.  She was seen in consultation     with General Surgery but was not felt to have any surgical issues.     She was also seen in consultation with Infectious Disease.  She is     slowly improving now on IV vancomycin.  Per Infectious Disease, she     will be transitioned to Bactrim and amoxicillin tomorrow.  If she     can tolerate these medications, she can likely be discharged home     tomorrow.  Her exam reveals continued slow improvement in her lower     extremity edema. 2. Anemia of acute illness:  This appears to be stable. 3. Thrush:  This will be treated with Nystatin. 4. False-positive HIV screen.  The patient had a reactive HIV screen,     however, her HIV RNA was negative.  Per Infectious Disease, this is     consistent with false-positive.  No further evaluation indicated.  CONSULTATIONS: 1. General Surgery, nonsurgical care. 2. Infectious Disease,  recommendations as above.  PROCEDURES:  None.  IMAGING: 1. Chest x-ray May 27:  No acute abnormality. 2. Chest x-ray May 29:  Mild cardiomegaly. 3. MRI May 30 of the left lower leg:  Nonspecific subcutaneous edema     and enhancement, left greater than right, appearance is consistent     with cellulitis.  No evidence of abscess, myofasciitis or     osteomyelitis.  MICROBIOLOGY: 1. Blood cultures x 2 May 27:  No growth, final. 2. Blood culture May 28:  No growth, final.  ANCILLARY STUDIES:  Venous Dopplers:  No evidence of left lower extremity DVT.  PERTINENT LABORATORY STUDIES: 1. HIV antibody was reactive, however, HIV-1 RNA altered quantitative     was negative, consistent with false-positive screen. 2. CBC notable for hemoglobin of 10.7 on discharge, stable.  White     blood cell count 17.0 on admission, on discharge 9.0. 3. Basic metabolic panel essentially unremarkable.  DISCHARGE INSTRUCTIONS: 1. The patient will be discharged home. 2. Diet is unrestricted. 3. She should increase activities slowly and use her home walker. 4. Follow up with  Dr. Oliver Barre in 1 week. 5. She will be discharged home with physical therapy.  Also     occupational therapy has recommended some durable medical equipment     which is pending at this time.  DISCHARGE MEDICATIONS: 1. Amoxicillin 500 mg p.o. t.i.d. for additional 7 days. 2. Acetaminophen 325 mg 2 tablets every 4 hours as needed. 3. Bactrim DS 800/160 2 tablets p.o. t.i.d. for an additional week. 4. Nystatin cream 1 application topically b.i.d. apply to the affected     area. 5. Nystatin suspension 5 mL p.o. q.i.d. 6. Oxycodone 5 mg p.o. every 4 hours as needed for pain. 7. Alprazolam 0.5 mg p.o. daily.  I discontinued the following medications: 1. Tramadol. 2. Voltaren.  Time coordinating discharge is 25 minutes.     Brendia Sacks, MD     DG/MEDQ  D:  05/26/2011  T:  05/26/2011  Job:  [id number]  cc:   Corwin Levins, MD [address] [place] [address]  Electronically Signed by Brendia Sacks  on 06/05/2011 05:27:09 PM

## 2011-06-06 LAB — BASIC METABOLIC PANEL
CO2: 34 mEq/L — ABNORMAL HIGH (ref 19–32)
Chloride: 100 mEq/L (ref 96–112)
Glucose, Bld: 99 mg/dL (ref 70–99)
Sodium: 138 mEq/L (ref 135–145)

## 2011-06-06 LAB — PHOSPHORUS: Phosphorus: 3.9 mg/dL (ref 2.3–4.6)

## 2011-06-06 LAB — MAGNESIUM: Magnesium: 2.2 mg/dL (ref 1.5–2.5)

## 2011-06-06 LAB — VANCOMYCIN, TROUGH: Vancomycin Tr: 5 ug/mL — ABNORMAL LOW (ref 10.0–20.0)

## 2011-06-08 DIAGNOSIS — L0390 Cellulitis, unspecified: Secondary | ICD-10-CM

## 2011-06-08 DIAGNOSIS — L0291 Cutaneous abscess, unspecified: Secondary | ICD-10-CM

## 2011-06-08 LAB — VANCOMYCIN, TROUGH: Vancomycin Tr: 14.8 ug/mL (ref 10.0–20.0)

## 2011-06-09 ENCOUNTER — Other Ambulatory Visit: Payer: Self-pay | Admitting: Internal Medicine

## 2011-06-09 LAB — HISTOPLASMA ANTIGEN, URINE

## 2011-06-10 NOTE — Telephone Encounter (Signed)
Faxed hardcopy to pharmacy. 

## 2011-06-11 ENCOUNTER — Emergency Department (HOSPITAL_COMMUNITY): Payer: Self-pay

## 2011-06-11 ENCOUNTER — Emergency Department (HOSPITAL_COMMUNITY)
Admission: EM | Admit: 2011-06-11 | Discharge: 2011-06-11 | Disposition: A | Discharge status: Home / Self Care | Payer: Self-pay | Attending: Emergency Medicine | Admitting: Emergency Medicine

## 2011-06-11 ENCOUNTER — Telehealth: Payer: Self-pay | Admitting: Licensed Clinical Social Worker

## 2011-06-11 DIAGNOSIS — L03119 Cellulitis of unspecified part of limb: Secondary | ICD-10-CM | POA: Insufficient documentation

## 2011-06-11 DIAGNOSIS — Z9889 Other specified postprocedural states: Secondary | ICD-10-CM | POA: Insufficient documentation

## 2011-06-11 DIAGNOSIS — Z792 Long term (current) use of antibiotics: Secondary | ICD-10-CM | POA: Insufficient documentation

## 2011-06-11 DIAGNOSIS — M7989 Other specified soft tissue disorders: Secondary | ICD-10-CM

## 2011-06-11 DIAGNOSIS — R509 Fever, unspecified: Secondary | ICD-10-CM | POA: Insufficient documentation

## 2011-06-11 DIAGNOSIS — L02419 Cutaneous abscess of limb, unspecified: Secondary | ICD-10-CM | POA: Insufficient documentation

## 2011-06-11 DIAGNOSIS — R112 Nausea with vomiting, unspecified: Secondary | ICD-10-CM | POA: Insufficient documentation

## 2011-06-11 LAB — DIFFERENTIAL
Basophils Absolute: 0 10*3/uL (ref 0.0–0.1)
Basophils Relative: 1 % (ref 0–1)
Eosinophils Absolute: 0.2 10*3/uL (ref 0.0–0.7)
Eosinophils Relative: 3 % (ref 0–5)
Lymphs Abs: 2.4 10*3/uL (ref 0.7–4.0)
Neutrophils Relative %: 34 % — ABNORMAL LOW (ref 43–77)

## 2011-06-11 LAB — CBC
MCV: 91.9 fL (ref 78.0–100.0)
Platelets: 254 10*3/uL (ref 150–400)
RDW: 13.1 % (ref 11.5–15.5)
WBC: 5 10*3/uL (ref 4.0–10.5)

## 2011-06-11 LAB — BASIC METABOLIC PANEL
BUN: 9 mg/dL (ref 6–23)
Calcium: 9.6 mg/dL (ref 8.4–10.5)
Chloride: 100 mEq/L (ref 96–112)
GFR calc non Af Amer: >60 mL/min (ref >60)
Glucose, Bld: 99 mg/dL (ref 70–99)
Sodium: 138 mEq/L (ref 135–145)

## 2011-06-11 NOTE — Telephone Encounter (Signed)
Patient was discharged from the hospital on 06/09/2011 and placed on iv vanc every 12 and patient is unsure of other iv medication that is every 6. She has extreme nausea, picc line state is painful, and she has diarrhea. I advised her to go to the ER for evaluation because her picc line could be infected or misplaced. She also needs her nausea under control because she stated she couldn't afford anymore rx's. Her home health nurse is scheduled to come to her home today, she said she would wait until her nurse evaluated her then she would make a decision. I advised her to have the nurse to call the office if she needed more information.

## 2011-06-16 ENCOUNTER — Encounter (HOSPITAL_BASED_OUTPATIENT_CLINIC_OR_DEPARTMENT_OTHER): Payer: Self-pay | Attending: General Surgery

## 2011-06-16 DIAGNOSIS — Z79899 Other long term (current) drug therapy: Secondary | ICD-10-CM | POA: Insufficient documentation

## 2011-06-16 DIAGNOSIS — L02419 Cutaneous abscess of limb, unspecified: Secondary | ICD-10-CM | POA: Insufficient documentation

## 2011-06-16 DIAGNOSIS — L03119 Cellulitis of unspecified part of limb: Secondary | ICD-10-CM | POA: Insufficient documentation

## 2011-06-16 DIAGNOSIS — L988 Other specified disorders of the skin and subcutaneous tissue: Secondary | ICD-10-CM | POA: Insufficient documentation

## 2011-06-16 DIAGNOSIS — Z8614 Personal history of Methicillin resistant Staphylococcus aureus infection: Secondary | ICD-10-CM | POA: Insufficient documentation

## 2011-06-17 ENCOUNTER — Ambulatory Visit (INDEPENDENT_AMBULATORY_CARE_PROVIDER_SITE_OTHER): Payer: Self-pay | Admitting: Infectious Disease

## 2011-06-17 ENCOUNTER — Encounter: Payer: Self-pay | Admitting: Infectious Disease

## 2011-06-17 DIAGNOSIS — L02419 Cutaneous abscess of limb, unspecified: Secondary | ICD-10-CM

## 2011-06-17 DIAGNOSIS — R609 Edema, unspecified: Secondary | ICD-10-CM

## 2011-06-17 DIAGNOSIS — E669 Obesity, unspecified: Secondary | ICD-10-CM

## 2011-06-17 DIAGNOSIS — L03116 Cellulitis of left lower limb: Secondary | ICD-10-CM

## 2011-06-17 MED ORDER — OXYCODONE-ACETAMINOPHEN 5-325 MG PO TABS: 1.0000 | ORAL_TABLET | Freq: Three times a day (TID) | ORAL | Status: DC | PRN

## 2011-06-17 NOTE — Assessment & Plan Note (Signed)
Losing weight is of critical value to her long-term health as well.

## 2011-06-17 NOTE — Progress Notes (Signed)
Subjective:    Patient ID: Maria [name], female    DOB: Mar 11, 1956, 55 y.o.   MRN: [id number]  HPI  55 year old Caucasian female who was admitted to [facility] on 05/17/2011 with severe cellulitis. At that time she was treated with intravenous vancomycin and independent. She improved on this therapy Menest and was then discharged on oral Bactrim and Augmentin. Despite these antibiotics and elevation of her legs or cellulitis worsened and she was readmitted the hospital in June. She was initially treated with vancomycin, Unasyn and clindamycin. Repeat imaging failed to show any evidence of a deep abscess or osteomyelitis. She was then changed to vancomycin and cefepime and ultimately vancomycin and imipenem. She had a large area on the posterior calf which became black and raised and it took quite a bit of time to resolve. Additionally her edema anteriorly and posterior to the great deal of time to resolve. She was sent home with a PICC line with plans to continue imipenem and vancomycin. These were continued but then she developed pain at the site of her PICC line and was concerned and came to the emergency department last week. While there Dr. Darnelle Catalan changed her to oral ciprofloxacin and oral doxycycline. The patient continued to do quite well and these antibiotics and is about to finish today. The erythema has subsided substantially the area on the back of her leg where raised black lesions have been removed and have resolved. She is without fevers chills nausea or systemic symptoms.  Review of Systems  Constitutional: Negative for fever, chills, diaphoresis, activity change, appetite change, fatigue and unexpected weight change.  HENT: Negative for congestion, sore throat, rhinorrhea, sneezing, trouble swallowing and sinus pressure.   Eyes: Negative for photophobia and visual disturbance.  Respiratory: Negative for cough, chest tightness, shortness of breath, wheezing and stridor.     Cardiovascular: Positive for leg swelling. Negative for chest pain and palpitations.  Gastrointestinal: Negative for nausea, vomiting, abdominal pain, diarrhea, constipation, blood in stool, abdominal distention and anal bleeding.  Genitourinary: Negative for dysuria, hematuria, flank pain and difficulty urinating.  Musculoskeletal: Positive for myalgias. Negative for back pain, joint swelling, arthralgias and gait problem.  Skin: Positive for color change. Negative for pallor, rash and wound.  Neurological: Negative for dizziness, tremors, weakness and light-headedness.  Hematological: Negative for adenopathy. Does not bruise/bleed easily.  Psychiatric/Behavioral: Negative for behavioral problems, confusion, sleep disturbance, dysphoric mood, decreased concentration and agitation.       Objective:   Physical Exam  Constitutional: She is oriented to person, place, and time. She appears well-developed and well-nourished. No distress.  HENT:  Head: Normocephalic and atraumatic.  Mouth/Throat: Oropharynx is clear and moist. No oropharyngeal exudate.  Eyes: Conjunctivae and EOM are normal. Pupils are equal, round, and reactive to light. No scleral icterus.  Neck: Normal range of motion. Neck supple. No JVD present.  Cardiovascular: Normal rate, regular rhythm and normal heart sounds.  Exam reveals no gallop and no friction rub.   No murmur heard. Pulmonary/Chest: Effort normal and breath sounds normal. No respiratory distress. She has no wheezes. She has no rales. She exhibits no tenderness.  Abdominal: She exhibits no distension and no mass. There is no tenderness. There is no rebound and no guarding.  Musculoskeletal: She exhibits edema and tenderness.  Lymphadenopathy:    She has no cervical adenopathy.  Neurological: She is alert and oriented to person, place, and time. She has normal reflexes. She exhibits normal muscle tone. Coordination normal.  Skin:  Skin is warm and dry. She is not  diaphoretic. There is erythema. No pallor.          Dear Maria [name] has subsided substantially anteriorly and posteriorly. There is minimal tenderness to palpation anteriorly. There is no fluctuance. The black areas in the back of her calf have her have resolved.  Psychiatric: She has a normal mood and affect. Her behavior is normal. Judgment and thought content normal.          Assessment & Plan:

## 2011-06-17 NOTE — Assessment & Plan Note (Signed)
This along with poor venous return is likely the increased risk for cellulitis

## 2011-06-17 NOTE — Assessment & Plan Note (Signed)
Strange case. It is possible she may have had a gram-negative rod planar all that that's why she improved with imipenem initially and then again when she was readmitted and then started on gram-negative coverage and did well with the ciprofloxacin. It also may be that that her obesity and poor venous return and have contributed to the protracted course that she is followed clinically she seems much better and comfortable observing her off antibiotics when she finishes them tomorrow. I would like her to continue followup with wound care. She certainly will be at risk for recurrent infection given the damage to the lymphatics has been done but this particular episode.

## 2011-06-19 DIAGNOSIS — M549 Dorsalgia, unspecified: Secondary | ICD-10-CM

## 2011-06-19 DIAGNOSIS — R262 Difficulty in walking, not elsewhere classified: Secondary | ICD-10-CM

## 2011-06-19 DIAGNOSIS — G8929 Other chronic pain: Secondary | ICD-10-CM

## 2011-06-19 DIAGNOSIS — L02419 Cutaneous abscess of limb, unspecified: Secondary | ICD-10-CM

## 2011-06-19 DIAGNOSIS — L03119 Cellulitis of unspecified part of limb: Secondary | ICD-10-CM

## 2011-07-07 ENCOUNTER — Telehealth: Payer: Self-pay | Admitting: *Deleted

## 2011-07-07 MED ORDER — MECLIZINE HCL 12.5 MG PO TABS: 12.5000 mg | ORAL_TABLET | Freq: Three times a day (TID) | ORAL | Status: AC | PRN

## 2011-07-07 NOTE — Telephone Encounter (Signed)
Pt informed

## 2011-07-07 NOTE — Telephone Encounter (Signed)
Patient c/o having a "dizzy spell" last night approximately 45 minutes after taking her meds [Alprazolam, Tramadol, Voltaren]. Pt states that she has never had a 'dizzy spell' before that she can ever remember. Pt states that the only difference was "took antacids before taking medication". Pt states that she has had No: edema, fever, chills, nausea, chest/arm pain, SOB Pt does c/o "buzzing in Left ear for 1 week" with No: pain and/or discharge.

## 2011-07-07 NOTE — Telephone Encounter (Signed)
May have dizziness due to inner ear inflammation from this information;  Ok to take mucinex otc bid prn, and ok for meclizine prbn - done per emr

## 2011-07-09 NOTE — Discharge Summary (Signed)
NAME:  JOELEEN, WORTLEY NO.:  [id number]  MEDICAL RECORD NO.:  [id number]  LOCATION:  1310                         FACILITY:  [facility]  PHYSICIAN:  Altha Harm, MDDATE OF BIRTH:  October 21, 1956  DATE OF ADMISSION:  05/29/2011 DATE OF DISCHARGE:  06/08/2011                              DISCHARGE SUMMARY   PRIMARY CARE PHYSICIAN:  Corwin Levins, M.D. with [facility].  DISCHARGE DISPOSITION:  Home.  FINAL DISCHARGE DIAGNOSES: 1. Cellulitis of the left lower extremity. 2. Venostasis of bilateral lower extremities. 3. Vaginal yeast infection, fully treated. 4. Hypertension. 5. BMI greater than 40. 6. Bilateral lower extremity edema. 7. Constipation.  FINAL DISCHARGE MEDICATIONS:  Include the following: 1. Doripenem 500 mg IV q.8h. for 9 days. 2. Lasix 40 mg p.o. daily. 3. MiraLax 17 grams in 8 ounces of fluid daily as needed for     constipation. 4. Potassium chloride 40 mEq p.o. daily. 5. Senokot 2 tablets p.o. daily as needed for constipation. 6. Vancomycin 1250 mg IV q.12h. x9 days. 7. Xanax 0.5 mg p.o. b.i.d. 8. Oxycodone 5 mg p.o. q.4h. p.r.n. pain. 9. Tylenol 650 mg p.o. q.4h. p.r.n. pain.  CONSULTANTS:  Acey Lav, M.D., Infectious Diseases.  DIAGNOSTIC STUDIES: 1. Portable chest x-ray done on admission, which shows right line PICC     with tip overlying the mid SVC.  Otherwise stable chest radiograph     with upper limits, normal heart size and pulmonary vascular     congestion. 2. MR of the left tibial fibular, which shows mild cellulitis of the     left lower extremity.  Less prominent than on May 19, 2011.  No     discrete abscesses, osteomyelitis, myositis or deep fasciitis     noted.  PROCEDURES:  PICC line placement.  CODE STATUS:  Full code.  ALLERGIES: 1. AUGMENTIN. 2. MORPHINE SULFATE. 3. VICODIN. 4. LATEX. 5. ADHESIVE TAPE.  CHIEF COMPLAINT:  Left lower leg swelling and pain.  HISTORY OF PRESENT ILLNESS:  Please refer  to the H and P by Dr. Butler Denmark for details of the HPI.  However, in short, this is a 55 year old patient, who had been previously treated for left lower extremity cellulitis and was discharged home on Bactrim and amoxicillin.  The patient's leg became more swollen and red and then she return to the Emergency Room for further evaluation and treatment.  The patient also was having fever and chills, which prompted her to come to the Emergency Room urgently.  The patient was then referred to [facility] for further evaluation and management.  HOSPITAL COURSE: 1. The patient was started on IV antibiotics including vancomycin and     imipenem for her cellulitis.  The patient's cellulitis seemed to be     rather insidious to treatment and the patient had increased     swelling of the left lower extremity.  The patient was given Lasix     to help with decreasing the edema and decreasing capillary     interstitial pressures.  The patient was also asked to keep her leg     elevated above the level of the heart, so as to improve lower  extremity venous return.  Initially, Dr. Daiva Eves from Infectious     Diseases felt that the patient may require biopsy; however, as the     patient improved with medical therapy changed that opinion and felt     that the presentation did not in fact warrant biopsy.  The patient     has improved considerably.  The patient did have some breakage of     the skin on the posterior portion of the leg and wound ostomy care     was asked to see the patient.  There have recommended Mepilex     Border to be placed on that followed by Kerlix and followed by     Coban to be changed every other day until the patient is seen by     the Wound Care Clinic on the June 16, 2011.  The patient had showed     steady improvement.  She is to continue on a total of nine     additional days of antibiotics, follow up in the Wound Care Clinic     and follow up with Dr. Daiva Eves in his  clinic on the June 17, 2011. 2. Vaginal yeast infection:  The patient had vaginal yeast infection,     was fully treated here in the hospital.  She has no further     symptoms. 3. Bilateral venostasis:  The patient has venostasis, which is likely     related to her obesity and physical stress pressure on the     extremities. 4. Constipation:  The patient was having problems with constipation     while hospitalized.  She was started on MiraLax and Senokot and has     since had no further problems. 5. Hypokalemia:  The patient developed some hypokalemia when she was     started on Lasix.  This has been repleted and the patient will     continue on potassium replacement as long as she is taking the     Lasix.  CONDITION ON DISCHARGE:  Patient's condition at the time of discharge is stable.  DISCHARGE PHYSICAL EXAMINATION:  VITAL SIGNS:  Her vital signs are as follows:  Temperature is 98, heart rate 78, blood pressure 130/68, respiratory rate 20, O2 sats are 99% on room air. GENERAL:  This is an obese, well-appearing female. HEENT EXAMINATION:  She is normocephalic, atraumatic.  Pupils are equally round and reactive to light and accommodation.  Extraocular movements are intact.  Oropharynx is moist.  No exudate, erythema or lesions are noted. NECK EXAMINATION:  Trachea is midline.  No masses, no thyromegaly, no JVD, no carotid bruit. ABDOMEN:  Obese, soft, nontender, nondistended.  No masses, no hepatosplenomegaly. EXTREMITIES:  The patient has swelling in bilateral lower extremities. The left lower extremity shows markedly decreased erythema.  There is no warmth and swelling has decreased considerably. PSYCHIATRIC:  She is alert and oriented x3.  Good insight and cognition. Good recent and remote recall. NEUROLOGICAL:  She has no focal neurological deficits.  Cranial nerves II through XII are grossly intact.  FOLLOWUP:  The patient is to follow up with her primary care  physician, Dr. Oliver Barre within a week.  The patient will follow up with Dr. Paulette Blanch Dam with Infectious Diseases on June 17, 2011 at 11:15 a.m. and the patient has an appointment to follow up in the wound clinic on June 16, 2011 at 1:00 p.m.  The patient will go home with advanced home  care with whom she is active.  They will administer her dressing changes as well as her IV antibiotics.  DIETARY RESTRICTIONS:  The patient should follow no added salt diet and calorie restricted diet.  PHYSICAL RESTRICTIONS:  Activity as tolerated.  While sitting, the patient should endeavor to raise her legs above the level of the heart so as to encourage proper healing of the cellulitis.  Total time for this discharge process including face-to-face time 42 minutes.     Altha Harm, MD     MAM/MEDQ  D:  06/08/2011  T:  06/08/2011  Job:  [id number]  cc:   Corwin Levins, MD [address] [place] [address]  Acey Lav, MD Fax: [phone]  Wound Clinic  Electronically Signed by Marthann Schiller MD on 07/09/2011 12:07:07 PM

## 2011-07-09 NOTE — H&P (Signed)
NAME:  Maria [name], Maria [name] NO.:  [id number]  MEDICAL RECORD NO.:  [id number]  LOCATION:  1310                         FACILITY:  [facility]  PHYSICIAN:  Calvert Cantor, M.D.     DATE OF BIRTH:  [birth year]  DATE OF ADMISSION:  05/29/2011 DATE OF DISCHARGE:                             HISTORY & PHYSICAL   PRIMARY CARE PHYSICIAN:  Corwin Levins, MD with [place].  PRESENTING COMPLAINT:  Left leg swelling and pain.  HISTORY OF PRESENT ILLNESS:  This is a 55 year old female with arthritis, chronic back pain, and obesity who was just discharged from the hospital 2 days ago after being treated for a left leg cellulitis. The patient was discharged home on Bactrim and amoxicillin.  She noticed that her left foot was becoming more swollen and redness was moving upwards, especially along her calf.  She also noticed that blister developed over the front of her leg which then ruptured and subsequently she had increased redness around the area of the blister with increased burning and pain.  She states that she woke up last night having chills which is not common for her because she is usually hot.  She has not checked her temperature.  She was referred for direct admission by her PCP.  PAST MEDICAL HISTORY: 1. Arthritis. 2. Lower back pain. 3. Obesity.  PAST SURGICAL HISTORY: 1. Right knee arthroscopy. 2. C-spine fusion which was complicated by development of a hematoma     x2 which required evacuation. 3. Hysterectomy.  SOCIAL HISTORY:  She smoked many years ago but has not since.  Does not drink or use any drugs.  She uses a cane to ambulate because of the severity of the arthritis in her right knee.  She lives in [place] alone.  Daughter lives in [place].  FAMILY HISTORY:  Hypertension and diabetes.  Mother had congestive heart failure.  There are multiple siblings with diabetes.  HOME MEDICATIONS:  Per med rec list are as follows: 1. Alprazolam 0.5 mg 1 tablet up  to twice a day as needed. 2. Amoxicillin 500 mg 3 times a day. 3. Bactrim DS 800/160, 2 tablets 3 times a day. 4. Nystatin oral suspension 5 mL twice a day. 5. Oxycodone 5 mg every 4 hours as needed. 6. Tylenol 325 mg 2 tablets every 4 hours as needed.  ALLERGIES:  AUGMENTIN causes vomiting, MORPHINE SULFATE causes vomiting, VICODIN causes hallucinations, LATEX causes a rash, PAPER TAPE causes bruises.  REVIEW OF SYSTEMS:  CONSTITUTIONAL:  She may have had some weight loss since her cellulitis developed due to poor appetite.  She had chills last night as mentioned in H&P.  She has been having a frontal headache every day for the past 2-3 days.  HEENT:  Vision is a little bit more blurry than usual.  No double vision.  No sore throat, sinus trouble, or earache.  RESPIRATORY:  She has some shortness of breath at baseline. No new shortness of breath or cough.  CARDIAC:  No chest pain or palpitations.  She has edema of the left foot and leg.  GI:  No nausea, vomiting, or diarrhea.  She has constipation.  GU:  No dysuria  or hematuria.  HEMATOLOGIC:  Bruises easily.  MUSCULOSKELETAL:  Has chronic right knee pain and chronic lower back pain.  NEUROLOGICAL:  No history of strokes or seizures.  No focal numbness, weakness, tingling. PSYCHOLOGICAL:  Has anxiety.  No depression.  PHYSICAL EXAMINATION:  GENERAL:  Middle-aged female sitting up in bed in no acute distress. VITALS:  Temperature 98.5, pulse 86, respirations 24, blood pressure 134/82, pulse ox is 96% on room air. HEENT:  Pupils equal, round, and reactive to light.  Extraocular movements are intact.  Conjunctivae are pink.  No scleral icterus.  Oral mucosa is moist.  Oropharynx is clear.  There is no thrush. NECK:  Supple.  No thyromegaly, lymphadenopathy, or carotid bruits. HEART:  Regular rate and rhythm.  No murmurs, rubs, or gallops. LUNGS:  Clear bilaterally.  Normal respiratory effort.  No use of accessory muscles. ABDOMEN:   Soft, nontender, nondistended.  Bowel sounds positive.  Unable to assess for organomegaly due to obesity. EXTREMITIES:  No cyanosis or clubbing.  She has significant edema of her left foot and leg almost up to her knee.  Right foot and leg, there is no edema, cyanosis, or clubbing.  Pedal pulses positive on the right side, difficult to palpate on the left. NEUROLOGICAL:  Cranial nerves II-XII intact.  Strength intact in all 4 extremities. PSYCHOLOGICAL:  She is awake, alert, oriented x3.  Mood and affect normal. SKIN:  Warm and dry.  On the left foot and leg, there is erythema extending from her toes up to about 2-3 inches below her knee both anteriorly and posteriorly.  There are areas which appear to have been blisters which had ruptured.  These are currently healing and I see no areas that are denuded.  I am unable to notice any fluctuation that would signify abscess formation.  Blood work is not yet done.  ASSESSMENT AND PLAN: 1. Recurrent cellulitis of left lower extremity.  I will be placing     her on vancomycin and Unasyn.  I have ordered blood cultures as     well.  I have also asked the nurse to elevate the leg on 2 pillows     as the foot is quite swollen and this may be increasing her level     of pain.  She can have oxycodone for pain and Tylenol if she is to     develop fevers. 2. Arthritis and chronic back pain.  Continue pain meds. 3. Obesity.  Blood work is still pending and depending on the results, further orders may be required.  Time on admission was 55 minutes.     Calvert Cantor, M.D.     SR/MEDQ  D:  05/29/2011  T:  05/29/2011  Job:  [id number]  cc:   Corwin Levins, MD [address] [place] [address]  Electronically Signed by Calvert Cantor M.D. on 07/09/2011 12:15:40 PM

## 2011-07-14 ENCOUNTER — Encounter (HOSPITAL_BASED_OUTPATIENT_CLINIC_OR_DEPARTMENT_OTHER): Payer: Self-pay | Attending: General Surgery

## 2011-07-14 DIAGNOSIS — L02419 Cutaneous abscess of limb, unspecified: Secondary | ICD-10-CM | POA: Insufficient documentation

## 2011-07-14 DIAGNOSIS — Z79899 Other long term (current) drug therapy: Secondary | ICD-10-CM | POA: Insufficient documentation

## 2011-07-14 DIAGNOSIS — Z8614 Personal history of Methicillin resistant Staphylococcus aureus infection: Secondary | ICD-10-CM | POA: Insufficient documentation

## 2011-07-14 DIAGNOSIS — L988 Other specified disorders of the skin and subcutaneous tissue: Secondary | ICD-10-CM | POA: Insufficient documentation

## 2011-07-14 DIAGNOSIS — L03119 Cellulitis of unspecified part of limb: Secondary | ICD-10-CM | POA: Insufficient documentation

## 2011-09-23 ENCOUNTER — Other Ambulatory Visit: Payer: Self-pay | Admitting: Internal Medicine

## 2011-09-23 MED ORDER — OXYCODONE-ACETAMINOPHEN 5-325 MG PO TABS: 1.0000 | ORAL_TABLET | Freq: Three times a day (TID) | ORAL | Status: DC | PRN

## 2011-09-23 NOTE — Telephone Encounter (Signed)
Done hardcopy to robin  

## 2011-09-24 NOTE — Telephone Encounter (Signed)
Called patient informed prescription is ready for pickup at front desk.

## 2011-10-13 NOTE — H&P (Signed)
  NAME:  Maria [name], GALLA NO.:  [id number]  MEDICAL RECORD NO.:  [id number]  LOCATION:  FOOT                         FACILITY:  [facility]  PHYSICIAN:  Joanne Gavel, M.D.        DATE OF BIRTH:  21-Dec-1956  DATE OF ADMISSION:  06/16/2011 DATE OF DISCHARGE:                             HISTORY & PHYSICAL   CHIEF COMPLAINT:  Wounds, left lower extremity.  HISTORY OF PRESENT ILLNESS:  This is a 55 year old female hospitalized earlier this month for severe cellulitis of the left lower extremity, during which time she developed superficial wound, seen by the inpatient wound care service.  During this hospitalization, she was treated with multiple antibiotics and was evidently quite ill with a very slow resolution of symptoms.  She had an MRI in the hospital which revealed no evidence of osteomyelitis, deep abscess, tendonitis, or fasciitis.  PAST MEDICAL HISTORY:  Significant for a MRSA infection in 2006 which resulted in a rather long hospitalization.  This was secondary to trauma and an infection of the right lower extremity.  PAST SURGICAL HISTORY:  She had arthroscopy of the knee in 2011 cervical fusion, and several other operations on her neck in 2010 and a hysterectomy in 1998.  Cigarettes none.  Alcohol none.  MEDICATIONS:  Cipro, alprazolam, furosemide.  ALLERGIES:  AUGMENTIN, MORPHINE, and VICODIN.  Otherwise, review of systems not contributory.  PHYSICAL EXAMINATION:  VITAL SIGNS:  Temperature 98.4, pulse 92, respirations 18, blood pressure 143/86. GENERAL APPEARANCE:  Well developed, well nourished, somewhat overweight, in no distress. HEENT:  Cranium normocephalic.  Eyes, ears, nose, throat normal. CHEST:  Clear. HEART:  Regular rhythm. EXTREMITIES:  Examination of the left lower extremity reveals good peripheral pulses.  There are very minor stasis changes.  Posteriorly in the leg, there is a sizable area of violaceous skin which is very slightly  blanching with pressure and very slightly tender.  The patient announces that this has improved greatly recently.  Examination of the right lower extremity reveals very minimal pretibial pitting.  There are no open wounds.  IMPRESSION:  Status post severe cellulitis with probably development of some blisters, now completely healed.  I recommend the patient is to see Infectious Disease tomorrow and they will continue with antibiotics as necessary.  When the Infectious Disease is satisfied that cellulitis has completely cleared, we recommend support stockings there being no active wounds at present.     Joanne Gavel, M.D.     RA/MEDQ  D:  06/16/2011  T:  06/17/2011  Job:  [id number]  cc:   Altha Harm, MD Acey Lav, MD  Electronically Signed by Joanne Gavel M.D. on 10/13/2011 09:08:04 AM

## 2011-10-29 ENCOUNTER — Other Ambulatory Visit: Payer: Self-pay

## 2011-10-29 MED ORDER — OXYCODONE-ACETAMINOPHEN 5-325 MG PO TABS: 1.0000 | ORAL_TABLET | Freq: Three times a day (TID) | ORAL | Status: DC | PRN

## 2011-10-29 NOTE — Telephone Encounter (Signed)
Called the patient informed prescription requested is ready for pickup at the front desk. 

## 2011-10-29 NOTE — Telephone Encounter (Signed)
Done hardcopy to robin  

## 2011-12-23 ENCOUNTER — Other Ambulatory Visit: Payer: Self-pay | Admitting: Internal Medicine

## 2011-12-23 NOTE — Telephone Encounter (Signed)
Done hardcopy to robin  

## 2011-12-24 NOTE — Telephone Encounter (Signed)
Faxed hardcopy to pharmacy. 

## 2012-03-09 ENCOUNTER — Ambulatory Visit (INDEPENDENT_AMBULATORY_CARE_PROVIDER_SITE_OTHER): Payer: Self-pay | Admitting: Internal Medicine

## 2012-03-09 ENCOUNTER — Ambulatory Visit: Payer: Self-pay | Admitting: Internal Medicine

## 2012-03-09 ENCOUNTER — Encounter: Payer: Self-pay | Admitting: Internal Medicine

## 2012-03-09 VITALS — BP 162/90 | HR 110 | Temp 98.5°F | Ht 66.0 in | Wt 349.1 lb

## 2012-03-09 DIAGNOSIS — R7309 Other abnormal glucose: Secondary | ICD-10-CM

## 2012-03-09 DIAGNOSIS — F3289 Other specified depressive episodes: Secondary | ICD-10-CM

## 2012-03-09 DIAGNOSIS — G8929 Other chronic pain: Secondary | ICD-10-CM

## 2012-03-09 DIAGNOSIS — M5090 Cervical disc disorder, unspecified, unspecified cervical region: Secondary | ICD-10-CM

## 2012-03-09 DIAGNOSIS — R7302 Impaired glucose tolerance (oral): Secondary | ICD-10-CM

## 2012-03-09 DIAGNOSIS — M47816 Spondylosis without myelopathy or radiculopathy, lumbar region: Secondary | ICD-10-CM | POA: Insufficient documentation

## 2012-03-09 DIAGNOSIS — F329 Major depressive disorder, single episode, unspecified: Secondary | ICD-10-CM

## 2012-03-09 DIAGNOSIS — I10 Essential (primary) hypertension: Secondary | ICD-10-CM

## 2012-03-09 DIAGNOSIS — M519 Unspecified thoracic, thoracolumbar and lumbosacral intervertebral disc disorder: Secondary | ICD-10-CM | POA: Insufficient documentation

## 2012-03-09 HISTORY — DX: Unspecified thoracic, thoracolumbar and lumbosacral intervertebral disc disorder: M51.9

## 2012-03-09 HISTORY — DX: Cervical disc disorder, unspecified, unspecified cervical region: M50.90

## 2012-03-09 HISTORY — DX: Other chronic pain: G89.29

## 2012-03-09 MED ORDER — CITALOPRAM HYDROBROMIDE 20 MG PO TABS: 20.0000 mg | ORAL_TABLET | Freq: Every day | ORAL | Status: DC

## 2012-03-09 MED ORDER — POTASSIUM CHLORIDE ER 10 MEQ PO TBCR: 10.0000 meq | EXTENDED_RELEASE_TABLET | Freq: Every day | ORAL | Status: DC

## 2012-03-09 MED ORDER — ALPRAZOLAM 0.5 MG PO TABS: 0.5000 mg | ORAL_TABLET | Freq: Two times a day (BID) | ORAL | Status: DC | PRN

## 2012-03-09 MED ORDER — HYDROCHLOROTHIAZIDE 25 MG PO TABS: 25.0000 mg | ORAL_TABLET | Freq: Every day | ORAL | Status: DC

## 2012-03-09 MED ORDER — OXYCODONE-ACETAMINOPHEN 5-325 MG PO TABS: 1.0000 | ORAL_TABLET | Freq: Three times a day (TID) | ORAL | Status: DC | PRN

## 2012-03-09 MED ORDER — MOMETASONE FUROATE 50 MCG/ACT NA SUSP: 2.0000 | Freq: Every day | NASAL | Status: DC

## 2012-03-09 NOTE — Patient Instructions (Addendum)
You are given the 3 months (3 prescriptions) of the pain medication today, and the alprazolam Continue all other medications as before; all your medications were refilled today Please have the pharmacy call with any refills you may need in the future Take all new medications as prescribed - the citalopram 20 mg per day (remember it takes about 3 wks for full effect), and the fluid pill (the HCTZ 25 mg) and the Potassium pill so that you dont get low on potassium Your blood sugar was 130 today (should be about 110 or less);  Please continue to focus on lower calories, low fat/low chol diet and weight loss You are given the handicap form filled out today Please continue to work on the worker's comp claim Please return in 6 months, or sooner if needed

## 2012-03-13 ENCOUNTER — Encounter: Payer: Self-pay | Admitting: Internal Medicine

## 2012-03-13 DIAGNOSIS — R7302 Impaired glucose tolerance (oral): Secondary | ICD-10-CM | POA: Insufficient documentation

## 2012-03-13 HISTORY — DX: Impaired glucose tolerance (oral): R73.02

## 2012-03-13 NOTE — Assessment & Plan Note (Signed)
To start celexa, and xanax prn,  to f/u any worsening symptoms or concerns, verified nonsuicidal, declines counseling due to insurance

## 2012-03-13 NOTE — Progress Notes (Signed)
Subjective:    Patient ID: Jane Canary Quincy, female    DOB: 1956/12/12, 56 y.o.   MRN: [id number]  HPI  Here to f/u; mentions her hx of 2010 c-spine surgury complicate by 2 immed hematoma evauations, 2011 right knee surgury, 2012 recurrent left leg cellulitis finally cleared; asks for handicap form signed, out of work for 2 yrs, daughter lives with her to help with daily needs, overall with severe pain with walking 100 ft, cont's to gain wt now approx 70 lbs since 2010, but overall stable x 2 mo.  Asks for CBG today - 130.   Pt denies polydipsia, polyuria, or low sugar symptoms such as weakness or confusion improved with po intake.  Pt states overall good compliance with meds, trying to follow lower cholesterol, diabetic diet, wt overall stable but little exercise however.     Pt denies chest pain, increased sob or doe, wheezing, orthopnea, PND, increased LE swelling, palpitations, dizziness or syncope.  Pt denies new neurological symptoms such as new headache, or facial or extremity weakness or numbness . Has had worsening depressive symptoms and anxiety, but no suicidal ideation, or panic.  Has ongoing LE mild edema, and now BP increased with wt increase it seems Past Medical History  Diagnosis Date  . Acute lymphadenitis 08/13/2009  . Acute pharyngitis 02/10/2010  . ALLERGIC RHINITIS 08/10/2007  . ANXIETY 12/06/2007  . BACK PAIN 12/06/2007  . CHEST PAIN 08/13/2009  . DEPRESSION 12/06/2007  . DIVERTICULOSIS, [place] 12/06/2007  . GERD 12/06/2007  . HYPERTENSION 12/06/2007  . LOW BACK PAIN 12/06/2007  . MRSA 02/07/2009  . Pain in joint, multiple sites 04/21/2010  . PARONYCHIA, FINGER 08/13/2009  . RASH-NONVESICULAR 05/09/2008  . SHOULDER PAIN, LEFT 05/09/2008  . SINUSITIS- ACUTE-NOS 05/09/2008  . THORACIC/LUMBOSACRAL NEURITIS/RADICULITIS UNSPEC 12/28/2008  . Lumbar disc disease 03/09/2012  . Cervical disc disease 03/09/2012  . Chronic pain 03/09/2012   Past Surgical History  Procedure Date  . Abdominal  hysterectomy   . S/p ovary cyst   . S/p right knee arthroscopy     Dr. Dion Saucier ortho    reports that she has quit smoking. She does not have any smokeless tobacco history on file. She reports that she drinks alcohol. Her drug history not on file. family history includes Asthma in her daughter and sister; Cancer in her maternal uncle and other; Diabetes in her mother; Heart disease in her mother; and Hypertension in her mother. Allergies  Allergen Reactions  . Codeine   . WUJ:[id number]+QIONGEXBMW Acid+Aspartame   . Latex    No current outpatient prescriptions on file prior to visit.   Review of Systems Review of Systems  Constitutional: Negative for diaphoresis and unexpected weight change.  HENT: Negative for drooling and tinnitus.   Eyes: Negative for photophobia and visual disturbance.  Respiratory: Negative for choking and stridor.   Gastrointestinal: Negative for vomiting and blood in stool.  Genitourinary: Negative for hematuria and decreased urine volume.    Objective:   Physical Exam BP 162/90  Pulse 110  Temp(Src) 98.5 F (36.9 C) (Oral)  Ht 5\' 6"  (1.676 m)  Wt 349 lb 2 oz (158.362 kg)  BMI 56.35 kg/m2  SpO2 96% Physical Exam  VS noted Constitutional: Pt appears well-developed and well-nourished./morbid obese  HENT: Head: Normocephalic.  Right Ear: External ear normal.  Left Ear: External ear normal.  Eyes: Conjunctivae and EOM are normal. Pupils are equal, round, and reactive to light.  Neck: Normal range of motion. Neck supple.  Cardiovascular: Normal  rate and regular rhythm.   Pulmonary/Chest: Effort normal and breath sounds normal.  Abd:  Soft, NT, non-distended, + BS Neurological: Pt is alert. No cranial nerve deficit.  Skin: Skin is warm. No erythema.  Psychiatric: Pt behavior is normal. Thought content normal. depressed affect, 1-2+ nervous    Assessment & Plan:

## 2012-03-13 NOTE — Assessment & Plan Note (Signed)
To start HCTZ 25 and klor con 10

## 2012-03-13 NOTE — Assessment & Plan Note (Signed)
For med refill today,  to f/u any worsening symptoms or concerns, handicap form filled out

## 2012-03-13 NOTE — Assessment & Plan Note (Signed)
stable overall by hx and exam, most recent data reviewed with pt, and pt to continue medical treatment as before, for better diet, more active if able, reduced calories, wt loss

## 2012-03-15 ENCOUNTER — Telehealth: Payer: Self-pay

## 2012-03-15 NOTE — Telephone Encounter (Signed)
Pt called stating that some of her Xanax was lost or stolen (pt says she is unsure what happened to medication).

## 2012-03-16 MED ORDER — ALPRAZOLAM 0.5 MG PO TABS: 0.5000 mg | ORAL_TABLET | Freq: Two times a day (BID) | ORAL | Status: DC | PRN

## 2012-03-16 NOTE — Telephone Encounter (Signed)
[place] controlled substance query is unable to verify, as it apparently is not up to date with most recent med fills  Please ask pharmacy to verify recent fill including med, strength, date filled and quantity

## 2012-03-16 NOTE — Telephone Encounter (Signed)
Pt without hx of abuse or diversion controlled substances  Very much doubt at this time  OK for repeat rx, please cancel refills related to first rx  Please reinforce to pt that this is very unsual due to the nature of the medication, and we cannot guarantee ability to replace meds in the future

## 2012-03-16 NOTE — Telephone Encounter (Signed)
Faxed hardcopy to [facility]. Called the patient informed of MD's instructions and prescription had been filled.. Did call the pharmacist at [facility] on [facility] [address] [address] to inform to cancel any refills on previous prescription.

## 2012-03-16 NOTE — Telephone Encounter (Signed)
Pt says that she had 1/2 of the prescription filled because of cost and after buying some groceries, thinks she lost it when loading everything into her car. Pt is requesting MD's okay for early refill of other half of Rx. She has be advised of MD's policy regarding early refills

## 2012-03-16 NOTE — Telephone Encounter (Signed)
Called [facility] on [address] and spoke to pharmacist. Last refill of Xanax was on March 13, 2012, was given #30, strength 0.5 mg to take BID.

## 2012-04-20 DIAGNOSIS — Z0271 Encounter for disability determination: Secondary | ICD-10-CM

## 2012-04-28 ENCOUNTER — Telehealth: Payer: Self-pay

## 2012-04-28 NOTE — Telephone Encounter (Signed)
Pt called c/o of several vague sxs - neck spasms, area of concern under one breast and several other that were difficult to understand. I advised pt to schedule and appt with MD for eval. Pt agreed and was transferred to schedulers.

## 2012-05-23 ENCOUNTER — Emergency Department (HOSPITAL_COMMUNITY): Payer: Self-pay

## 2012-05-23 ENCOUNTER — Observation Stay (HOSPITAL_COMMUNITY)
Admission: EM | Admit: 2012-05-23 | Discharge: 2012-05-24 | Disposition: A | Discharge status: Home / Self Care | Payer: Self-pay | Attending: Emergency Medicine | Admitting: Emergency Medicine

## 2012-05-23 ENCOUNTER — Encounter (HOSPITAL_COMMUNITY): Payer: Self-pay | Admitting: Radiology

## 2012-05-23 DIAGNOSIS — R197 Diarrhea, unspecified: Secondary | ICD-10-CM | POA: Insufficient documentation

## 2012-05-23 DIAGNOSIS — B372 Candidiasis of skin and nail: Secondary | ICD-10-CM

## 2012-05-23 DIAGNOSIS — R509 Fever, unspecified: Secondary | ICD-10-CM | POA: Insufficient documentation

## 2012-05-23 DIAGNOSIS — N12 Tubulo-interstitial nephritis, not specified as acute or chronic: Principal | ICD-10-CM | POA: Insufficient documentation

## 2012-05-23 DIAGNOSIS — L539 Erythematous condition, unspecified: Secondary | ICD-10-CM | POA: Insufficient documentation

## 2012-05-23 DIAGNOSIS — E860 Dehydration: Secondary | ICD-10-CM | POA: Insufficient documentation

## 2012-05-23 LAB — URINALYSIS, ROUTINE W REFLEX MICROSCOPIC
Bilirubin Urine: NEGATIVE
Hgb urine dipstick: NEGATIVE
Ketones, ur: NEGATIVE mg/dL
Protein, ur: NEGATIVE mg/dL
Urobilinogen, UA: 0.2 mg/dL (ref 0.0–1.0)

## 2012-05-23 LAB — URINE MICROSCOPIC-ADD ON

## 2012-05-23 LAB — DIFFERENTIAL
Basophils Relative: 0 % (ref 0–1)
Eosinophils Absolute: 0 10*3/uL (ref 0.0–0.7)
Monocytes Absolute: 0.9 10*3/uL (ref 0.1–1.0)
Monocytes Relative: 5 % (ref 3–12)
Neutrophils Relative %: 88 % — ABNORMAL HIGH (ref 43–77)

## 2012-05-23 LAB — BASIC METABOLIC PANEL
BUN: 13 mg/dL (ref 6–23)
Calcium: 9 mg/dL (ref 8.4–10.5)
Creatinine, Ser: 0.85 mg/dL (ref 0.50–1.10)
GFR calc Af Amer: 88 mL/min — ABNORMAL LOW (ref >90)
GFR calc non Af Amer: 76 mL/min — ABNORMAL LOW (ref >90)
Potassium: 3.9 mEq/L (ref 3.5–5.1)

## 2012-05-23 LAB — CBC
Hemoglobin: 14.2 g/dL (ref 12.0–15.0)
MCH: 31.8 pg (ref 26.0–34.0)
MCHC: 34.8 g/dL (ref 30.0–36.0)

## 2012-05-23 MED ORDER — ACETAMINOPHEN 325 MG PO TABS: 650.0000 mg | ORAL_TABLET | Freq: Four times a day (QID) | ORAL | Status: DC | PRN

## 2012-05-23 MED ORDER — IBUPROFEN 200 MG PO TABS
400.0000 mg | ORAL_TABLET | Freq: Once | ORAL | Status: AC
  Administered 2012-05-23: 400 mg via ORAL
  Filled 2012-05-23: qty 2

## 2012-05-23 MED ORDER — HYDROMORPHONE HCL PF 1 MG/ML IJ SOLN
0.5000 mg | INTRAMUSCULAR | Status: DC | PRN
  Administered 2012-05-23 – 2012-05-24 (×3): 0.5 mg via INTRAVENOUS
  Filled 2012-05-23 (×3): qty 1

## 2012-05-23 MED ORDER — SODIUM CHLORIDE 0.9 % IV SOLN
1000.0000 mL | INTRAVENOUS | Status: DC
  Administered 2012-05-23 – 2012-05-24 (×2): 1000 mL via INTRAVENOUS

## 2012-05-23 MED ORDER — ONDANSETRON HCL 4 MG/2ML IJ SOLN
4.0000 mg | Freq: Once | INTRAMUSCULAR | Status: AC
  Administered 2012-05-23: 4 mg via INTRAVENOUS
  Filled 2012-05-23: qty 2

## 2012-05-23 MED ORDER — DEXTROSE 5 % IV SOLN
1.0000 g | Freq: Once | INTRAVENOUS | Status: AC
  Administered 2012-05-23: 1 g via INTRAVENOUS
  Filled 2012-05-23: qty 10

## 2012-05-23 MED ORDER — IOHEXOL 300 MG/ML  SOLN
100.0000 mL | Freq: Once | INTRAMUSCULAR | Status: AC | PRN
  Administered 2012-05-23: 100 mL via INTRAVENOUS

## 2012-05-23 MED ORDER — MORPHINE SULFATE 4 MG/ML IJ SOLN
4.0000 mg | Freq: Once | INTRAMUSCULAR | Status: DC
  Filled 2012-05-23: qty 1

## 2012-05-23 MED ORDER — SODIUM CHLORIDE 0.9 % IV BOLUS (SEPSIS)
1000.0000 mL | INTRAVENOUS | Status: AC
  Administered 2012-05-23: 1000 mL via INTRAVENOUS

## 2012-05-23 MED ORDER — ACETAMINOPHEN 325 MG PO TABS
ORAL_TABLET | ORAL | Status: AC
  Administered 2012-05-23: 650 mg via ORAL
  Filled 2012-05-23: qty 2

## 2012-05-23 MED ORDER — ACETAMINOPHEN 325 MG PO TABS
650.0000 mg | ORAL_TABLET | Freq: Once | ORAL | Status: AC
  Administered 2012-05-23: 650 mg via ORAL

## 2012-05-23 MED ORDER — HYDROMORPHONE HCL PF 1 MG/ML IJ SOLN
0.5000 mg | INTRAMUSCULAR | Status: AC
  Administered 2012-05-23: 0.5 mg via INTRAVENOUS
  Filled 2012-05-23: qty 1

## 2012-05-23 MED ORDER — OXYCODONE-ACETAMINOPHEN 5-325 MG PO TABS
1.0000 | ORAL_TABLET | Freq: Once | ORAL | Status: AC
  Administered 2012-05-23: 1 via ORAL
  Filled 2012-05-23: qty 1

## 2012-05-23 MED ORDER — DEXTROSE 5 % IV SOLN: 1.0000 g | Freq: Two times a day (BID) | INTRAVENOUS | Status: DC

## 2012-05-23 NOTE — ED Notes (Signed)
Pt is currently back in her room from radiology

## 2012-05-23 NOTE — ED Notes (Signed)
Pt states that last night she started to have chills and weakness, pt states when she woke up she was freezing, pt states she took her temp at home and it was 101. Pt states she did not take anything at home for the fever.

## 2012-05-23 NOTE — ED Provider Notes (Signed)
I have seen and examined this patient with the resident.  I agree with the resident's note, assessment and plan except as indicated.    Patient with chills and fevers for one day.  She denies specific urinary symptoms.  She does have a urine that is potentially consistent with UTI although does not show significant white blood cell count.  She also has an area in her lower abdomen and between her pannus that appears poorly consistent with these infection.  Patient secondarily describe some upper abdominal pain which is being evaluated with a CT scan to rule out other intra-abdominal causes for the patient's symptoms especially given her mild elevation of lactate.  Patient's been given Tylenol for her fever and IV fluids for some dehydration.  Shows a given ceftriaxone for possible UTI while awaiting her CT scan.  Nat Christen, MD 05/23/12 [phone]

## 2012-05-23 NOTE — ED Provider Notes (Signed)
History     CSN: [id number]  Arrival date & time 05/23/12  1347   First MD Initiated Contact with Patient 05/23/12 1551      Chief Complaint  Patient presents with  . Fever  . Generalized Body Aches    (Consider location/radiation/quality/duration/timing/severity/associated sxs/prior treatment) Patient is a 56 y.o. female presenting with fever and diarrhea. The history is provided by the patient.  Fever Primary symptoms of the febrile illness include fever and diarrhea. Primary symptoms do not include fatigue, headaches, cough, shortness of breath, abdominal pain, nausea, vomiting or dysuria. The current episode started today. This is a new problem. The problem has not changed since onset. The fever began today. The fever has been unchanged since its onset. The maximum temperature recorded prior to her arrival was 103 to 104 F. The temperature was taken by an oral thermometer.  Diarrhea The primary symptoms include fever and diarrhea. Primary symptoms do not include fatigue, abdominal pain, nausea, vomiting or dysuria. The illness began today. The onset was sudden. The problem has been gradually improving.  The diarrhea began today. The diarrhea is watery. The diarrhea occurs 2 to 4 times per day.  The illness is also significant for chills and back pain (mild worsening of chronic back pain).    Past Medical History  Diagnosis Date  . Acute lymphadenitis 08/13/2009  . Acute pharyngitis 02/10/2010  . ALLERGIC RHINITIS 08/10/2007  . ANXIETY 12/06/2007  . BACK PAIN 12/06/2007  . CHEST PAIN 08/13/2009  . DEPRESSION 12/06/2007  . DIVERTICULOSIS, Stamp 12/06/2007  . GERD 12/06/2007  . HYPERTENSION 12/06/2007  . LOW BACK PAIN 12/06/2007  . MRSA 02/07/2009  . Pain in joint, multiple sites 04/21/2010  . PARONYCHIA, FINGER 08/13/2009  . RASH-NONVESICULAR 05/09/2008  . SHOULDER PAIN, LEFT 05/09/2008  . SINUSITIS- ACUTE-NOS 05/09/2008  . THORACIC/LUMBOSACRAL NEURITIS/RADICULITIS UNSPEC 12/28/2008    . Lumbar disc disease 03/09/2012  . Cervical disc disease 03/09/2012  . Chronic pain 03/09/2012  . Impaired glucose tolerance 03/13/2012    Past Surgical History  Procedure Date  . Abdominal hysterectomy   . S/p ovary cyst   . S/p right knee arthroscopy     Dr. Dion Saucier ortho    Family History  Problem Relation Age of Onset  . Heart disease Mother   . Hypertension Mother   . Diabetes Mother   . Asthma Sister   . Asthma Daughter   . Cancer Maternal Uncle     [place]  . Cancer Other     ovarian    History  Substance Use Topics  . Smoking status: Former Games developer  . Smokeless tobacco: Not on file   Comment: quit 10/09  . Alcohol Use: Yes    OB History    Grav Para Term Preterm Abortions TAB SAB Ect Mult Living                  Review of Systems  Constitutional: Positive for fever and chills. Negative for fatigue.  HENT: Negative for congestion, drooling and neck pain.   Eyes: Negative for pain.  Respiratory: Negative for cough and shortness of breath.   Cardiovascular: Negative for chest pain.  Gastrointestinal: Positive for diarrhea. Negative for nausea, vomiting and abdominal pain.  Genitourinary: Negative for dysuria and hematuria.  Musculoskeletal: Positive for back pain (mild worsening of chronic back pain). Negative for gait problem.  Skin: Negative for color change.  Neurological: Negative for dizziness and headaches.  Hematological: Negative for adenopathy.  Psychiatric/Behavioral: Negative for behavioral problems.  All other systems reviewed and are negative.    Allergies  Amoxicillin-pot clavulanate; Codeine; Vicodin; and Latex  Home Medications   Current Outpatient Rx  Name Route Sig Dispense Refill  . ALPRAZOLAM 0.5 MG PO TABS Oral Take 0.5 mg by mouth 2 (two) times daily.     Marland Kitchen CITALOPRAM HYDROBROMIDE 20 MG PO TABS Oral Take 20 mg by mouth daily.    Marland Kitchen DICLOFENAC SODIUM 75 MG PO TBEC Oral Take 75 mg by mouth 2 (two) times daily.    Marland Kitchen  HYDROCHLOROTHIAZIDE 25 MG PO TABS Oral Take 25 mg by mouth daily.    . OXYCODONE-ACETAMINOPHEN 5-325 MG PO TABS Oral Take 2 tablets by mouth at bedtime.    Marland Kitchen POTASSIUM CHLORIDE CRYS ER 10 MEQ PO TBCR Oral Take 10 mEq by mouth 2 (two) times daily.      BP 156/58  Pulse 111  Temp(Src) 103 F (39.4 C) (Oral)  Resp 18  SpO2 100%  Physical Exam  Nursing note and vitals reviewed. Constitutional: She is oriented to person, place, and time. She appears well-developed and well-nourished.  HENT:  Head: Normocephalic.  Mouth/Throat: No oropharyngeal exudate.  Eyes: Conjunctivae and EOM are normal. Pupils are equal, round, and reactive to light.  Neck: Normal range of motion. Neck supple.  Cardiovascular: Normal rate, regular rhythm, normal heart sounds and intact distal pulses.  Exam reveals no gallop and no friction rub.   No murmur heard. Pulmonary/Chest: Effort normal and breath sounds normal. No respiratory distress. She has no wheezes.  Abdominal: Soft. Bowel sounds are normal. There is tenderness (mild ttp of epig and RUQ areas). There is no rebound and no guarding.       Mild erythema under pannus extending across abdomen and down inguinal creases.  Musculoskeletal: Normal range of motion. She exhibits no edema and no tenderness.  Neurological: She is alert and oriented to person, place, and time.  Skin: Skin is warm and dry.  Psychiatric: She has a normal mood and affect. Her behavior is normal.    ED Course  Procedures (including critical care time)  Labs Reviewed - No data to display No results found.   No diagnosis found.    MDM  4:34 PM 56 y.o. female pw chills that began this morning. Pt notes mild diarrhea, nausea at home as well. Pt febrile here, tachycardic to 111, mildly hypertensive. Pt appears well on exam, abdomen is soft and benign. Will pursue infectious workup.   Pt has mild erythema under pannus, do not suspect cellulitis. Likely mild maceration vs yeast.    Pt found to have UTI, will give Rocephin. Lactate mildly elev. Will get CT abdomen.   CT abd negative for acute findings. Pt remains tachycardic after 1L IVF. Concern for developing pyelonephritis. Will admit to CDU pyelonephritis protocol.    Clinical Impression 1. Pyelonephritis        Purvis Sheffield, MD 05/23/12 2315

## 2012-05-23 NOTE — ED Notes (Signed)
Pt is currently in radiology. 

## 2012-05-23 NOTE — ED Notes (Signed)
Called CT to let them know that she has finished her contrast

## 2012-05-23 NOTE — ED Notes (Signed)
Reassessed patient pain, pt states pain for legs and under stomach is still at a 7

## 2012-05-23 NOTE — ED Notes (Signed)
Complains of chills, and fever, and going to restroom a lot.

## 2012-05-24 ENCOUNTER — Encounter (HOSPITAL_COMMUNITY): Payer: Self-pay

## 2012-05-24 LAB — CBC
HCT: 37.7 % (ref 36.0–46.0)
MCH: 31.1 pg (ref 26.0–34.0)
MCV: 92.4 fL (ref 78.0–100.0)
Platelets: 196 10*3/uL (ref 150–400)
RBC: 4.08 MIL/uL (ref 3.87–5.11)
RDW: 13.1 % (ref 11.5–15.5)

## 2012-05-24 LAB — DIFFERENTIAL
Eosinophils Absolute: 0 10*3/uL (ref 0.0–0.7)
Eosinophils Relative: 0 % (ref 0–5)
Lymphs Abs: 2.3 10*3/uL (ref 0.7–4.0)
Monocytes Absolute: 0.4 10*3/uL (ref 0.1–1.0)

## 2012-05-24 LAB — LACTIC ACID, PLASMA: Lactic Acid, Venous: 1.8 mmol/L (ref 0.5–2.2)

## 2012-05-24 MED ORDER — NYSTATIN 100000 UNIT/GM EX POWD
Freq: Once | CUTANEOUS | Status: AC
  Administered 2012-05-24: 10:00:00 via TOPICAL
  Filled 2012-05-24: qty 15

## 2012-05-24 MED ORDER — NYSTATIN 100000 UNIT/GM EX CREA: TOPICAL_CREAM | CUTANEOUS | Status: DC

## 2012-05-24 MED ORDER — SULFAMETHOXAZOLE-TRIMETHOPRIM 800-160 MG PO TABS: 1.0000 | ORAL_TABLET | Freq: Two times a day (BID) | ORAL | Status: AC

## 2012-05-24 NOTE — ED Notes (Signed)
Patient is resting comfortably. 

## 2012-05-24 NOTE — ED Provider Notes (Signed)
I have seen and examined this patient with the resident.  I agree with the resident's note, assessment and plan except as indicated.    Patient with negative CT scan of placed on pyelonephritis protocol given her mild elevation in her lactate which is likely due to some dehydration as well as her nausea and mild tachycardia.  Nat Christen, MD 05/24/12 [phone]

## 2012-05-24 NOTE — ED Notes (Signed)
No needs abc intact. Pt on stretcher,

## 2012-05-24 NOTE — Discharge Instructions (Signed)
Apply nystatin cream to affected area twice daily until improvement.  Take Bactrim as prescribed.  Follow up with your doctor in 2 days.  IF your symptoms worsen, your fever not improved with OTC tylenol or ibuprofen, or you notice rash to your leg, then return to ER for further care.    Pyelonephritis, Adult Pyelonephritis is a kidney infection. A kidney infection can happen quickly, or it can last for a long time. HOME CARE   Take your medicine (antibiotics) as told. Finish it even if you start to feel better.   Keep all doctor visits as told.   Drink enough fluids to keep your pee (urine) clear or pale yellow.   Only take medicine as told by your doctor.  GET HELP RIGHT AWAY IF:   You have a fever.   You cannot take your medicine or drink fluids as told.   You have chills and shaking.   You feel very weak or pass out (faint).   You do not feel better after 2 days.  MAKE SURE YOU:  Understand these instructions.   Will watch your condition.   Will get help right away if you are not doing well or get worse.  Document Released: 01/14/2005 Document Revised: 11/26/2011 Document Reviewed: 05/27/2011 [facility] Patient Information 2012 [place], [place].  Candida Infection, Adult A candida infection (also called yeast, fungus and Monilia infection) is an overgrowth of yeast that can occur anywhere on the body. A yeast infection commonly occurs in warm, moist body areas. Usually, the infection remains localized but can spread to become a systemic infection. A yeast infection may be a sign of a more severe disease such as diabetes, leukemia, or AIDS. A yeast infection can occur in both men and women. In women, Candida vaginitis is a vaginal infection. It is one of the most common causes of vaginitis. Men usually do not have symptoms or know they have an infection until other problems develop. Men may find out they have a yeast infection because their sex partner has a yeast infection.  Uncircumcised men are more likely to get a yeast infection than circumcised men. This is because the uncircumcised glans is not exposed to air and does not remain as dry as that of a circumcised glans. Older adults may develop yeast infections around dentures. CAUSES  Women  Antibiotics.   Steroid medication taken for a long time.   Being overweight (obese).   Diabetes.   Poor immune condition.   Certain serious medical conditions.   Immune suppressive medications for organ transplant patients.   Chemotherapy.   Pregnancy.   Menstration.   Stress and fatigue.   Intravenous drug use.   Oral contraceptives.   Wearing tight-fitting clothes in the crotch area.   Catching it from a sex partner who has a yeast infection.   Spermicide.   Intravenous, urinary, or other catheters.  Men  Catching it from a sex partner who has a yeast infection.   Having oral or anal sex with a person who has the infection.   Spermicide.   Diabetes.   Antibiotics.   Poor immune system.   Medications that suppress the immune system.   Intravenous drug use.   Intravenous, urinary, or other catheters.  SYMPTOMS  Women  Thick, white vaginal discharge.   Vaginal itching.   Redness and swelling in and around the vagina.   Irritation of the lips of the vagina and perineum.   Blisters on the vaginal lips and perineum.  Painful sexual intercourse.   Low blood sugar (hypoglycemia).   Painful urination.   Bladder infections.   Intestinal problems such as constipation, indigestion, bad breath, bloating, increase in gas, diarrhea, or loose stools.  Men  Men may develop intestinal problems such as constipation, indigestion, bad breath, bloating, increase in gas, diarrhea, or loose stools.   Dry, cracked skin on the penis with itching or discomfort.   Jock itch.   Dry, flaky skin.   Athlete's foot.   Hypoglycemia.  DIAGNOSIS  Women  A history and an exam are  performed.   The discharge may be examined under a microscope.   A culture may be taken of the discharge.  Men  A history and an exam are performed.   Any discharge from the penis or areas of cracked skin will be looked at under the microscope and cultured.   Stool samples may be cultured.  TREATMENT  Women  Vaginal antifungal suppositories and creams.   Medicated creams to decrease irritation and itching on the outside of the vagina.   Warm compresses to the perineal area to decrease swelling and discomfort.   Oral antifungal medications.   Medicated vaginal suppositories or cream for repeated or recurrent infections.   Wash and dry the irritation areas before applying the cream.   Eating yogurt with lactobacillus may help with prevention and treatment.   Sometimes painting the vagina with gentian violet solution may help if creams and suppositories do not work.  Men  Antifungal creams and oral antifungal medications.   Sometimes treatment must continue for 30 days after the symptoms go away to prevent recurrence.  HOME CARE INSTRUCTIONS  Women  Use cotton underwear and avoid tight-fitting clothing.   Avoid colored, scented toilet paper and deodorant tampons or pads.   Do not douche.   Keep your diabetes under control.   Finish all the prescribed medications.   Keep your skin clean and dry.   Consume milk or yogurt with lactobacillus active culture regularly. If you get frequent yeast infections and think that is what the infection is, there are over-the-counter medications that you can get. If the infection does not show healing in 3 days, talk to your caregiver.   Tell your sex partner you have a yeast infection. Your partner may need treatment also, especially if your infection does not clear up or recurs.  Men  Keep your skin clean and dry.   Keep your diabetes under control.   Finish all prescribed medications.   Tell your sex partner that you have a  yeast infection so they can be treated if necessary.  SEEK MEDICAL CARE IF:   Your symptoms do not clear up or worsen in one week after treatment.   You have an oral temperature above 102 F (38.9 C).   You have trouble swallowing or eating for a prolonged time.   You develop blisters on and around your vagina.   You develop vaginal bleeding and it is not your menstrual period.   You develop abdominal pain.   You develop intestinal problems as mentioned above.   You get weak or lightheaded.   You have painful or increased urination.   You have pain during sexual intercourse.  MAKE SURE YOU:   Understand these instructions.   Will watch your condition.   Will get help right away if you are not doing well or get worse.  Document Released: 01/14/2005 Document Revised: 11/26/2011 Document Reviewed: 04/28/2010 [facility] Patient Information 2012 [place],  [employer]. 

## 2012-05-24 NOTE — ED Notes (Signed)
MD at bedside. 

## 2012-05-24 NOTE — ED Provider Notes (Signed)
Pt on pyelo protocol.  Will reassess prior to dc  8:14 AM Patient presents with complaint of fever, chills, and some diarrhea for the past couple of days. She is elevated temperature of 103 in the ED initially. Her workup is significant for an elevated white count of 19.6 with left shift. Mildly elevated lactic acid of 2.6. And a possible urinary tract infection although patient denies any urinary symptoms. Her abdominal CT scan was unremarkable. Patient also was found to have some erythema below the pannus which is thought to be related to yeast infection. Patient was placed on pottle protocol in CDU. She has received Rocephin as treatment.  This a.m., while patient's ambulate to the bathroom she noticed some area of redness to both of her ankle. Patient expressed her anxiety, recalling that she has a significant cellulitis infection last year which required 3 weeks of hospitalization. Patient states the current symptoms are similar to her prior cellulitic infection.  On exam, patient appears nontoxic, is morbidly obese, however with stable normal vital signs. She is alert and oriented x3. Heart regular rate and rhythm with no murmurs rubs or gallops. Lungs clear to auscultation bilaterally. Abdomen is soft and nontender. Mild discomfort and to the epigastrium. Area of erythema span across the pannus below fold, suggestive of yeast infection. Her low ankle with mild erythema and mottled appearance. Palpable dorsalis pedis bilaterally. Negative Homans sign, past nontender bilaterally.  Due to the prior history of complicated cellulitis, and her presenting symptom, I have discussed with my attending who agrees to see patient.  9:16 AM ON repeat lab, her WBC normalized as well as her lactic acid.  Pt is stable to be discharged.  My attending has seen and evaluated pt and recommend bactrim for skin infection and UTI, as well as f/u with PCP in 2 days.  Pt voice understanding and agrees with plan.  Her VSS.     Fayrene Helper, PA-C 05/24/12 1005

## 2012-05-25 LAB — URINE CULTURE
Colony Count: >=100000
Culture  Setup Time: 201306040841

## 2012-06-22 ENCOUNTER — Other Ambulatory Visit: Payer: Self-pay

## 2012-06-22 MED ORDER — OXYCODONE-ACETAMINOPHEN 5-325 MG PO TABS: 2.0000 | ORAL_TABLET | Freq: Every day | ORAL | Status: DC

## 2012-06-22 NOTE — Telephone Encounter (Signed)
Done hardcopy to robin  

## 2012-06-22 NOTE — Telephone Encounter (Signed)
Patient informed to pickup prescription requested at front desk 

## 2012-06-28 ENCOUNTER — Encounter (HOSPITAL_COMMUNITY): Payer: Self-pay | Admitting: Emergency Medicine

## 2012-06-28 ENCOUNTER — Inpatient Hospital Stay (HOSPITAL_COMMUNITY)
Admission: EM | Admit: 2012-06-28 | Discharge: 2012-07-05 | DRG: 690 | Disposition: A | Discharge status: Home / Self Care | Payer: Self-pay | Attending: Internal Medicine | Admitting: Internal Medicine

## 2012-06-28 ENCOUNTER — Emergency Department (HOSPITAL_COMMUNITY): Payer: Self-pay

## 2012-06-28 DIAGNOSIS — M545 Low back pain, unspecified: Secondary | ICD-10-CM

## 2012-06-28 DIAGNOSIS — J309 Allergic rhinitis, unspecified: Secondary | ICD-10-CM

## 2012-06-28 DIAGNOSIS — L03116 Cellulitis of left lower limb: Secondary | ICD-10-CM

## 2012-06-28 DIAGNOSIS — K5730 Diverticulosis of large intestine without perforation or abscess without bleeding: Secondary | ICD-10-CM

## 2012-06-28 DIAGNOSIS — Z6841 Body Mass Index (BMI) 40.0 and over, adult: Secondary | ICD-10-CM

## 2012-06-28 DIAGNOSIS — R21 Rash and other nonspecific skin eruption: Secondary | ICD-10-CM

## 2012-06-28 DIAGNOSIS — K219 Gastro-esophageal reflux disease without esophagitis: Secondary | ICD-10-CM

## 2012-06-28 DIAGNOSIS — A4902 Methicillin resistant Staphylococcus aureus infection, unspecified site: Secondary | ICD-10-CM

## 2012-06-28 DIAGNOSIS — I10 Essential (primary) hypertension: Secondary | ICD-10-CM | POA: Diagnosis present

## 2012-06-28 DIAGNOSIS — M549 Dorsalgia, unspecified: Secondary | ICD-10-CM

## 2012-06-28 DIAGNOSIS — N12 Tubulo-interstitial nephritis, not specified as acute or chronic: Principal | ICD-10-CM | POA: Diagnosis present

## 2012-06-28 DIAGNOSIS — Z0000 Encounter for general adult medical examination without abnormal findings: Secondary | ICD-10-CM

## 2012-06-28 DIAGNOSIS — F411 Generalized anxiety disorder: Secondary | ICD-10-CM

## 2012-06-28 DIAGNOSIS — R609 Edema, unspecified: Secondary | ICD-10-CM

## 2012-06-28 DIAGNOSIS — D72829 Elevated white blood cell count, unspecified: Secondary | ICD-10-CM | POA: Diagnosis present

## 2012-06-28 DIAGNOSIS — F329 Major depressive disorder, single episode, unspecified: Secondary | ICD-10-CM

## 2012-06-28 DIAGNOSIS — M2550 Pain in unspecified joint: Secondary | ICD-10-CM

## 2012-06-28 DIAGNOSIS — IMO0002 Reserved for concepts with insufficient information to code with codable children: Secondary | ICD-10-CM

## 2012-06-28 DIAGNOSIS — R509 Fever, unspecified: Secondary | ICD-10-CM

## 2012-06-28 DIAGNOSIS — M5090 Cervical disc disorder, unspecified, unspecified cervical region: Secondary | ICD-10-CM

## 2012-06-28 DIAGNOSIS — L03119 Cellulitis of unspecified part of limb: Secondary | ICD-10-CM | POA: Diagnosis present

## 2012-06-28 DIAGNOSIS — M519 Unspecified thoracic, thoracolumbar and lumbosacral intervertebral disc disorder: Secondary | ICD-10-CM

## 2012-06-28 DIAGNOSIS — M25519 Pain in unspecified shoulder: Secondary | ICD-10-CM

## 2012-06-28 DIAGNOSIS — E876 Hypokalemia: Secondary | ICD-10-CM | POA: Diagnosis present

## 2012-06-28 DIAGNOSIS — G8929 Other chronic pain: Secondary | ICD-10-CM | POA: Diagnosis present

## 2012-06-28 DIAGNOSIS — E669 Obesity, unspecified: Secondary | ICD-10-CM | POA: Diagnosis present

## 2012-06-28 DIAGNOSIS — R079 Chest pain, unspecified: Secondary | ICD-10-CM

## 2012-06-28 DIAGNOSIS — R300 Dysuria: Secondary | ICD-10-CM | POA: Diagnosis present

## 2012-06-28 DIAGNOSIS — R6510 Systemic inflammatory response syndrome (SIRS) of non-infectious origin without acute organ dysfunction: Secondary | ICD-10-CM

## 2012-06-28 DIAGNOSIS — R7302 Impaired glucose tolerance (oral): Secondary | ICD-10-CM

## 2012-06-28 DIAGNOSIS — L02419 Cutaneous abscess of limb, unspecified: Secondary | ICD-10-CM | POA: Diagnosis present

## 2012-06-28 LAB — CBC WITH DIFFERENTIAL/PLATELET
Basophils Absolute: 0 10*3/uL (ref 0.0–0.1)
Basophils Relative: 0 % (ref 0–1)
Eosinophils Absolute: 0.1 10*3/uL (ref 0.0–0.7)
Eosinophils Relative: 0 % (ref 0–5)
HCT: 40 % (ref 36.0–46.0)
MCH: 31.2 pg (ref 26.0–34.0)
MCHC: 34.3 g/dL (ref 30.0–36.0)
Monocytes Absolute: 0.7 10*3/uL (ref 0.1–1.0)
Neutro Abs: 10.4 10*3/uL — ABNORMAL HIGH (ref 1.7–7.7)
RDW: 12.9 % (ref 11.5–15.5)

## 2012-06-28 LAB — COMPREHENSIVE METABOLIC PANEL
AST: 21 U/L (ref 0–37)
Albumin: 4 g/dL (ref 3.5–5.2)
Calcium: 9.2 mg/dL (ref 8.4–10.5)
Chloride: 98 mEq/L (ref 96–112)
Creatinine, Ser: 0.79 mg/dL (ref 0.50–1.10)
Total Protein: 7.2 g/dL (ref 6.0–8.3)

## 2012-06-28 LAB — URINALYSIS, ROUTINE W REFLEX MICROSCOPIC
Glucose, UA: NEGATIVE mg/dL
Leukocytes, UA: NEGATIVE
Specific Gravity, Urine: 1.014 (ref 1.005–1.030)
pH: 7 (ref 5.0–8.0)

## 2012-06-28 LAB — RAPID STREP SCREEN (MED CTR MEBANE ONLY): Streptococcus, Group A Screen (Direct): NEGATIVE

## 2012-06-28 LAB — URINE MICROSCOPIC-ADD ON

## 2012-06-28 MED ORDER — OXYCODONE-ACETAMINOPHEN 5-325 MG PO TABS
1.0000 | ORAL_TABLET | ORAL | Status: DC | PRN
  Administered 2012-06-29 – 2012-07-01 (×7): 2 via ORAL
  Filled 2012-06-28 (×7): qty 2

## 2012-06-28 MED ORDER — POTASSIUM CHLORIDE CRYS ER 10 MEQ PO TBCR
10.0000 meq | EXTENDED_RELEASE_TABLET | Freq: Two times a day (BID) | ORAL | Status: DC
  Administered 2012-06-29 (×2): 10 meq via ORAL
  Filled 2012-06-28 (×3): qty 1

## 2012-06-28 MED ORDER — OXYCODONE-ACETAMINOPHEN 5-325 MG PO TABS: 2.0000 | ORAL_TABLET | Freq: Every day | ORAL | Status: DC

## 2012-06-28 MED ORDER — NYSTATIN 100000 UNIT/GM EX CREA
TOPICAL_CREAM | Freq: Two times a day (BID) | CUTANEOUS | Status: DC
  Administered 2012-06-29 – 2012-07-05 (×14): via TOPICAL
  Filled 2012-06-28: qty 15

## 2012-06-28 MED ORDER — CIPROFLOXACIN IN D5W 400 MG/200ML IV SOLN
400.0000 mg | Freq: Two times a day (BID) | INTRAVENOUS | Status: DC
  Administered 2012-06-29 (×2): 400 mg via INTRAVENOUS
  Filled 2012-06-28 (×3): qty 200

## 2012-06-28 MED ORDER — ALPRAZOLAM 0.5 MG PO TABS
0.5000 mg | ORAL_TABLET | Freq: Two times a day (BID) | ORAL | Status: DC
  Administered 2012-06-29 – 2012-07-05 (×14): 0.5 mg via ORAL
  Filled 2012-06-28 (×14): qty 1

## 2012-06-28 MED ORDER — ONDANSETRON HCL 4 MG/2ML IJ SOLN
4.0000 mg | Freq: Four times a day (QID) | INTRAMUSCULAR | Status: DC | PRN
  Administered 2012-06-29 – 2012-07-01 (×3): 4 mg via INTRAVENOUS
  Filled 2012-06-28 (×3): qty 2

## 2012-06-28 MED ORDER — DICLOFENAC SODIUM 75 MG PO TBEC
75.0000 mg | DELAYED_RELEASE_TABLET | Freq: Two times a day (BID) | ORAL | Status: DC
  Administered 2012-06-29 – 2012-07-05 (×14): 75 mg via ORAL
  Filled 2012-06-28 (×15): qty 1

## 2012-06-28 MED ORDER — ACETAMINOPHEN 325 MG PO TABS: 650.0000 mg | ORAL_TABLET | Freq: Once | ORAL | Status: DC

## 2012-06-28 MED ORDER — ACETAMINOPHEN 325 MG PO TABS
650.0000 mg | ORAL_TABLET | Freq: Once | ORAL | Status: AC
  Administered 2012-06-28: 650 mg via ORAL
  Filled 2012-06-28: qty 2

## 2012-06-28 MED ORDER — SODIUM CHLORIDE 0.9 % IV SOLN
INTRAVENOUS | Status: DC
  Administered 2012-06-29: 01:00:00 via INTRAVENOUS

## 2012-06-28 MED ORDER — KETOROLAC TROMETHAMINE 60 MG/2ML IM SOLN
60.0000 mg | Freq: Once | INTRAMUSCULAR | Status: AC
  Administered 2012-06-28: 60 mg via INTRAMUSCULAR
  Filled 2012-06-28: qty 2

## 2012-06-28 MED ORDER — ONDANSETRON HCL 4 MG PO TABS: 4.0000 mg | ORAL_TABLET | Freq: Four times a day (QID) | ORAL | Status: DC | PRN

## 2012-06-28 MED ORDER — ENOXAPARIN SODIUM 30 MG/0.3ML ~~LOC~~ SOLN
30.0000 mg | Freq: Every day | SUBCUTANEOUS | Status: DC
  Filled 2012-06-28: qty 0.3

## 2012-06-28 MED ORDER — SODIUM CHLORIDE 0.9 % IJ SOLN
3.0000 mL | Freq: Two times a day (BID) | INTRAMUSCULAR | Status: DC
  Administered 2012-06-29 – 2012-07-03 (×7): 3 mL via INTRAVENOUS

## 2012-06-28 MED ORDER — HYDROCHLOROTHIAZIDE 25 MG PO TABS
25.0000 mg | ORAL_TABLET | Freq: Every day | ORAL | Status: DC
  Administered 2012-06-29 – 2012-07-05 (×7): 25 mg via ORAL
  Filled 2012-06-28 (×7): qty 1

## 2012-06-28 NOTE — ED Notes (Signed)
Patient reports that she has had intermittent numbness and tingling to her fingers and toes fort he past few days. The patient reports that she started to have a fever and chills today. Patient reports SOB but denies a cough.

## 2012-06-28 NOTE — ED Notes (Signed)
Pt with fever, chill, sore throat which started today.  Pt did not take her BP medications today.

## 2012-06-28 NOTE — ED Notes (Signed)
Pt given iced water to drink, encouraged to drink slowly.

## 2012-06-28 NOTE — H&P (Cosign Needed)
[facility] History and Physical  Taisa Deloria Ellegood HQI:[id number] DOB: 04-30-1956 DOA: 06/28/2012  Referring physician: ED physician PCP: Oliver Barre, MD   Chief Complaint: fever and chills  HPI:  Pt is 56 yo female who presents to [facility] ED with main concern of new onset fevers, chills, fatigue, that initially started one day prior to admission and associated with dysuria and lower area abdominal pain, she describes it as dull, constant, non radiating, 7/10 in severity. She reports being diagnosed with kidney infection approximately one month prior to admission and has been treated with antibiotic but is unsure of the name. She denies any specific aggravating or alleviating factors, no vomiting, no other abdominal or urinary concerns other than the ones mentioned. She denies any specific focal neurologic symptoms, no other systemic symptoms, no headaches, no visual changes.   Review of Systems:  Constitutional: positive for fever, chills and malaise/fatigue. Negative for diaphoresis.  HENT: Negative for hearing loss, ear pain, nosebleeds, congestion, sore throat, neck pain, tinnitus and ear discharge.   Eyes: Negative for blurred vision, double vision, photophobia, pain, discharge and redness.  Respiratory: Negative for cough, hemoptysis, sputum production, shortness of breath, wheezing and stridor.   Cardiovascular: Negative for chest pain, palpitations, orthopnea, claudication and leg swelling.  Gastrointestinal: Negative for nausea, vomiting. Negative for heartburn, constipation, blood in stool and melena. Positive for abdominal pain. Genitourinary: Positive for dysuria, negative for urgency, frequency, hematuria and flank pain.  Musculoskeletal: Negative for myalgias, back pain, joint pain and falls.  Skin: Negative for itching and rash.  Neurological: Negative for dizziness and weakness. Negative for tingling, tremors, sensory change, speech change, focal weakness, loss of consciousness  and headaches.  Endo/Heme/Allergies: Negative for environmental allergies and polydipsia. Does not bruise/bleed easily.  Psychiatric/Behavioral: Negative for suicidal ideas. The patient is not nervous/anxious.      Past Medical History  Diagnosis Date  . Acute lymphadenitis 08/13/2009  . ANXIETY 12/06/2007  . DEPRESSION 12/06/2007  . DIVERTICULOSIS, [place] 12/06/2007  . GERD 12/06/2007  . HYPERTENSION 12/06/2007  . Lumbar disc disease 03/09/2012  . Cervical disc disease 03/09/2012   Past Surgical History  Procedure Date  . Abdominal hysterectomy   . S/p ovary cyst   . S/p right knee arthroscopy     Dr. Dion Saucier ortho  . Cervical disc surgery    Social History:  reports that she has quit smoking. She does not have any smokeless tobacco history on file. She reports that she does not drink alcohol or use illicit drugs.  Allergies  Allergen Reactions  . Amoxicillin-Pot Clavulanate Nausea And Vomiting  . Codeine Other (See Comments)    hallucinations  . Morphine And Related Other (See Comments)    headaches  . Vicodin (Hydrocodone-Acetaminophen) Other (See Comments)    hallucinations  . Latex Rash    Family History  Problem Relation Age of Onset  . Heart disease Mother   . Hypertension Mother   . Diabetes Mother   . Asthma Sister   . Asthma Daughter   . Cancer Maternal Uncle     Lafever  . Cancer Other     ovarian    Prior to Admission medications   Medication Sig Start Date End Date Taking? Authorizing Provider  ALPRAZolam Prudy Feeler) 0.5 MG tablet Take 0.5 mg by mouth 2 (two) times daily.    Yes Historical Provider, MD  diclofenac (VOLTAREN) 75 MG EC tablet Take 75 mg by mouth 2 (two) times daily.   Yes Historical Provider, MD  hydrochlorothiazide (HYDRODIURIL) 25 MG tablet Take 25 mg by mouth daily.   Yes Historical Provider, MD  nystatin cream (MYCOSTATIN) Apply to affected area 2 times daily 05/24/12 05/24/13 Yes Fayrene Helper, PA-C  oxyCODONE-acetaminophen (PERCOCET) 5-325 MG  per tablet Take 2 tablets by mouth at bedtime. 06/22/12  Yes Corwin Levins, MD  potassium chloride (K-DUR,KLOR-CON) 10 MEQ tablet Take 10 mEq by mouth 2 (two) times daily.   Yes Historical Provider, MD   Physical Exam: Filed Vitals:   06/28/12 1652 06/28/12 1947 06/28/12 2134  BP: 175/79    Pulse: 104    Temp: 100.6 F (38.1 C) 100.7 F (38.2 C) 100 F (37.8 C)  TempSrc: Oral Oral Oral  Resp: 19    SpO2: 96%      Physical Exam  Constitutional: Appears well-developed and well-nourished. No distress.  HENT: Normocephalic. External right and left ear normal. Oropharynx is clear and moist.  Eyes: Conjunctivae and EOM are normal. PERRLA, no scleral icterus.  Neck: Normal ROM. Neck supple. No JVD. No tracheal deviation. No thyromegaly.  CVS: Regular rhythm, tachycardic, S1/S2 +, no murmurs, no gallops, no carotid bruit.  Pulmonary: Effort and breath sounds normal, no stridor, rhonchi, wheezes, rales.  Abdominal: Soft. BS +,  no distension, tenderness in lower quadrants, rebound or guarding.  Musculoskeletal: Normal range of motion. No edema and no tenderness.  Lymphadenopathy: No lymphadenopathy noted, cervical, inguinal. Neuro: Alert. Normal reflexes, muscle tone coordination. No cranial nerve deficit. Skin: Skin is warm and dry. No rash noted. Not diaphoretic. No erythema. No pallor.  Psychiatric: Normal mood and affect. Behavior, judgment, thought content normal.   Labs on Admission:  Basic Metabolic Panel:  Lab 06/28/12 1610  NA 134*  K 3.8  CL 98  CO2 25  GLUCOSE 102*  BUN 10  CREATININE 0.79  CALCIUM 9.2  MG --  PHOS --   Liver Function Tests:  Lab 06/28/12 1920  AST 21  ALT 33  ALKPHOS 80  BILITOT 0.7  PROT 7.2  ALBUMIN 4.0   CBC:  Lab 06/28/12 1920  WBC 13.1*  NEUTROABS 10.4*  HGB 13.7  HCT 40.0  MCV 91.1  PLT 264    Radiological Exams on Admission: Dg Chest 2 View  06/28/2012  *RADIOLOGY REPORT*  Clinical Data: Short of breath and chills.  CHEST - 2  VIEW  Comparison: 05/23/2012  Findings: Borderline cardiomegaly.  No pneumothorax or pleural effusion.  Vascular congestion.  No consolidation.  IMPRESSION: Borderline cardiomegaly and vascular congestion.  Original Report Authenticated By: Donavan Burnet, M.D.    EKG: Normal sinus rhythm, no ST/T wave changes  Assessment/Plan  Fever, tachycardia, leukocytosis - SIRS - unclear etiology at this time but pt is tachycardic and febrile in ED, with leukocytosis - current work up includes negative UA, unremarkable CXR for an infectious etiology, normal procalcitonin and lactic acid level - will obtain urine culture and blood culture and will treat with empiric antibiotic as pt has history of pyelonephritis apparently last month - antibiotic can be d/c if no signs of an infectious etiology and cultures negative - will also check UDS and HIV  HTN - will continue home medication regimen  Code Status: Full Family Communication: Pt at bedside Disposition Plan: telemetry floor  Debbora Presto, MD  [facility] Pager [phone]  If 7PM-7AM, please contact night-coverage [email or web address] Password [email or web address] 06/28/2012, 11:17 PM

## 2012-06-28 NOTE — ED Provider Notes (Signed)
History     CSN: [id number]  Arrival date & time 06/28/12  1634   First MD Initiated Contact with Patient 06/28/12 1827      Chief Complaint  Patient presents with  . Fever  . Chills  . Numbness    (Consider location/radiation/quality/duration/timing/severity/associated sxs/prior treatment) HPI Comments: Patient presents with one day of chills, fever, dysuria and low back pain. Symptoms feel similar to kidney infection she had last month. She also reports intermittent numbness and tingling to her finger and toe tips for the past several days. No chest pain, shortness of breath or cough. No nausea, vomiting, abdominal pain or diarrhea.  The history is provided by the patient.    Past Medical History  Diagnosis Date  . Acute lymphadenitis 08/13/2009  . Acute pharyngitis 02/10/2010  . ALLERGIC RHINITIS 08/10/2007  . ANXIETY 12/06/2007  . BACK PAIN 12/06/2007  . CHEST PAIN 08/13/2009  . DEPRESSION 12/06/2007  . DIVERTICULOSIS, [place] 12/06/2007  . GERD 12/06/2007  . HYPERTENSION 12/06/2007  . LOW BACK PAIN 12/06/2007  . MRSA 02/07/2009  . Pain in joint, multiple sites 04/21/2010  . PARONYCHIA, FINGER 08/13/2009  . RASH-NONVESICULAR 05/09/2008  . SHOULDER PAIN, LEFT 05/09/2008  . SINUSITIS- ACUTE-NOS 05/09/2008  . THORACIC/LUMBOSACRAL NEURITIS/RADICULITIS UNSPEC 12/28/2008  . Lumbar disc disease 03/09/2012  . Cervical disc disease 03/09/2012  . Chronic pain 03/09/2012  . Impaired glucose tolerance 03/13/2012    Past Surgical History  Procedure Date  . Abdominal hysterectomy   . S/p ovary cyst   . S/p right knee arthroscopy     Dr. Dion Saucier ortho  . Cervical disc surgery     Family History  Problem Relation Age of Onset  . Heart disease Mother   . Hypertension Mother   . Diabetes Mother   . Asthma Sister   . Asthma Daughter   . Cancer Maternal Uncle     [place]  . Cancer Other     ovarian    History  Substance Use Topics  . Smoking status: Former Games developer  . Smokeless  tobacco: Not on file   Comment: quit 10/09  . Alcohol Use: No    OB History    Grav Para Term Preterm Abortions TAB SAB Ect Mult Living                  Review of Systems  Constitutional: Positive for fever, chills, activity change, appetite change and fatigue.  HENT: Negative for congestion and rhinorrhea.   Respiratory: Negative for cough and shortness of breath.   Cardiovascular: Negative for chest pain.  Gastrointestinal: Positive for nausea. Negative for vomiting and abdominal pain.  Genitourinary: Positive for dysuria. Negative for vaginal bleeding and vaginal discharge.  Musculoskeletal: Positive for myalgias, back pain and arthralgias.  Neurological: Positive for weakness and headaches.    Allergies  Amoxicillin-pot clavulanate; Codeine; Morphine and related; Vicodin; and Latex  Home Medications   Current Outpatient Rx  Name Route Sig Dispense Refill  . ALPRAZOLAM 0.5 MG PO TABS Oral Take 0.5 mg by mouth 2 (two) times daily.     Marland Kitchen DICLOFENAC SODIUM 75 MG PO TBEC Oral Take 75 mg by mouth 2 (two) times daily.    Marland Kitchen HYDROCHLOROTHIAZIDE 25 MG PO TABS Oral Take 25 mg by mouth daily.    . NYSTATIN 100000 UNIT/GM EX CREA  Apply to affected area 2 times daily 30 g 0  . OXYCODONE-ACETAMINOPHEN 5-325 MG PO TABS Oral Take 2 tablets by mouth at bedtime. 60 tablet 0  .  POTASSIUM CHLORIDE CRYS ER 10 MEQ PO TBCR Oral Take 10 mEq by mouth 2 (two) times daily.      BP 118/47  Pulse 82  Temp 99 F (37.2 C) (Oral)  Resp 19  SpO2 95%  Physical Exam  Constitutional: She is oriented to person, place, and time. She appears well-developed and well-nourished. No distress.  HENT:  Head: Normocephalic and atraumatic.  Mouth/Throat: Oropharynx is clear and moist. No oropharyngeal exudate.  Eyes: Conjunctivae and EOM are normal. Pupils are equal, round, and reactive to light.  Neck: Normal range of motion. Neck supple.  Cardiovascular: Normal rate, regular rhythm and normal heart sounds.    No murmur heard. Pulmonary/Chest: Effort normal and breath sounds normal.  Abdominal: Soft. There is no tenderness. There is no rebound and no guarding.  Musculoskeletal: Normal range of motion. She exhibits tenderness.       Paraspinal low back tenderness, midline lumbar tenderness  Neurological: She is alert and oriented to person, place, and time. No cranial nerve deficit.  Skin: Skin is warm.    ED Course  Procedures (including critical care time)  Labs Reviewed  CBC WITH DIFFERENTIAL - Abnormal; Notable for the following:    WBC 13.1 (*)     Neutrophils Relative 80 (*)     Neutro Abs 10.4 (*)     All other components within normal limits  COMPREHENSIVE METABOLIC PANEL - Abnormal; Notable for the following:    Sodium 134 (*)     Glucose, Bld 102 (*)     All other components within normal limits  URINALYSIS, ROUTINE W REFLEX MICROSCOPIC - Abnormal; Notable for the following:    Hgb urine dipstick TRACE (*)     All other components within normal limits  LACTIC ACID, PLASMA  PROCALCITONIN  RAPID STREP SCREEN  URINE MICROSCOPIC-ADD ON  URINE CULTURE  CULTURE, BLOOD (ROUTINE X 2)  CULTURE, BLOOD (ROUTINE X 2)  PHOSPHORUS  MAGNESIUM  TSH  URINE CULTURE  BASIC METABOLIC PANEL  CBC  PRO B NATRIURETIC PEPTIDE  URINE RAPID DRUG SCREEN (HOSP PERFORMED)  HIV ANTIBODY (ROUTINE TESTING)   Dg Chest 2 View  06/28/2012  *RADIOLOGY REPORT*  Clinical Data: Short of breath and chills.  CHEST - 2 VIEW  Comparison: 05/23/2012  Findings: Borderline cardiomegaly.  No pneumothorax or pleural effusion.  Vascular congestion.  No consolidation.  IMPRESSION: Borderline cardiomegaly and vascular congestion.  Original Report Authenticated By: Donavan Burnet, M.D.     1. Back pain   2. SIRS (systemic inflammatory response syndrome)       MDM  Chills, fever, urinary symptoms.  Mild fever and tachycardia. No focal neurological deficits.   Urinalysis negative. Chest x-ray negative. Patient  does have fever, leukocytosis, tachycardia consistent with SIRS. suspect viral syndrome however with midline back pain cannot rule out epidural abscess. Patient has no neurological deficits, no bowel or bladder incontinence.  Plan to admit patient for MRI in the morning and observation overnight. Continue IV hydration we'll hold off antibiotics at hospitalist request.        Glynn Octave, MD 06/29/12 0010

## 2012-06-29 ENCOUNTER — Encounter (HOSPITAL_COMMUNITY): Payer: Self-pay | Admitting: *Deleted

## 2012-06-29 DIAGNOSIS — E876 Hypokalemia: Secondary | ICD-10-CM | POA: Diagnosis present

## 2012-06-29 DIAGNOSIS — D72829 Elevated white blood cell count, unspecified: Secondary | ICD-10-CM | POA: Diagnosis present

## 2012-06-29 DIAGNOSIS — R509 Fever, unspecified: Secondary | ICD-10-CM | POA: Diagnosis present

## 2012-06-29 DIAGNOSIS — F411 Generalized anxiety disorder: Secondary | ICD-10-CM

## 2012-06-29 LAB — TSH: TSH: 1.511 u[IU]/mL (ref 0.350–4.500)

## 2012-06-29 LAB — CBC
HCT: 35.7 % — ABNORMAL LOW (ref 36.0–46.0)
MCHC: 33.9 g/dL (ref 30.0–36.0)
MCV: 91.5 fL (ref 78.0–100.0)
Platelets: 222 10*3/uL (ref 150–400)
RDW: 13.1 % (ref 11.5–15.5)
WBC: 9.9 10*3/uL (ref 4.0–10.5)

## 2012-06-29 LAB — URINE CULTURE
Colony Count: NO GROWTH
Culture: NO GROWTH

## 2012-06-29 LAB — BASIC METABOLIC PANEL
BUN: 10 mg/dL (ref 6–23)
Calcium: 8 mg/dL — ABNORMAL LOW (ref 8.4–10.5)
Creatinine, Ser: 0.91 mg/dL (ref 0.50–1.10)
GFR calc Af Amer: 81 mL/min — ABNORMAL LOW (ref >90)

## 2012-06-29 LAB — GLUCOSE, CAPILLARY: Glucose-Capillary: 93 mg/dL (ref 70–99)

## 2012-06-29 LAB — MAGNESIUM: Magnesium: 1.9 mg/dL (ref 1.5–2.5)

## 2012-06-29 MED ORDER — MENTHOL 3 MG MT LOZG
1.0000 | LOZENGE | OROMUCOSAL | Status: DC | PRN
  Filled 2012-06-29: qty 9

## 2012-06-29 MED ORDER — KETOROLAC TROMETHAMINE 30 MG/ML IJ SOLN
30.0000 mg | Freq: Three times a day (TID) | INTRAMUSCULAR | Status: AC | PRN
  Administered 2012-06-29 – 2012-07-01 (×4): 30 mg via INTRAVENOUS
  Filled 2012-06-29 (×4): qty 1

## 2012-06-29 MED ORDER — CEPASTAT 14.5 MG MT LOZG: 1.0000 | LOZENGE | OROMUCOSAL | Status: DC | PRN

## 2012-06-29 MED ORDER — PIPERACILLIN-TAZOBACTAM 3.375 G IVPB
3.3750 g | Freq: Three times a day (TID) | INTRAVENOUS | Status: DC
  Administered 2012-06-29 – 2012-07-01 (×7): 3.375 g via INTRAVENOUS
  Filled 2012-06-29 (×8): qty 50

## 2012-06-29 MED ORDER — POTASSIUM CHLORIDE CRYS ER 20 MEQ PO TBCR
40.0000 meq | EXTENDED_RELEASE_TABLET | Freq: Once | ORAL | Status: AC
  Administered 2012-06-29: 40 meq via ORAL
  Filled 2012-06-29: qty 2

## 2012-06-29 MED ORDER — ENOXAPARIN SODIUM 80 MG/0.8ML ~~LOC~~ SOLN
75.0000 mg | SUBCUTANEOUS | Status: DC
  Administered 2012-06-29 – 2012-07-04 (×6): 75 mg via SUBCUTANEOUS
  Filled 2012-06-29 (×7): qty 0.8

## 2012-06-29 NOTE — Progress Notes (Signed)
Patient ID: Maria [name], female   DOB: 02/07/1956, 56 y.o.   MRN: [id number] [facility] PROGRESS NOTE  Maria [name] NWG:[id number] DOB: 06-Mar-1956 DOA: 06/28/2012 PCP: Oliver Barre, MD  Brief narrative: Pt is 56 yo female who was admitted 06/28/2012 with fever and abdominal pain, leukocytosis. Currently treated for ? Pyelonephritis.   Assessment/Plan:  Principal Problem:  *Fever - unclear etiology at this time and pt still febrile - could be related to pyelonephritis that pt was diagnosed last week - leukocytosis is resolved at this point - will continue Zosyn for now, d/c Ciprofloxacin - urine and blood culture pending to date  Active Problems:  HYPERTENSION - stable and at goal - will continue to monitor vitals per floor protocol   Chronic pain - continue analgesia for adequate pain control   Leukocytosis - now trending down - will follow up on CBC in AM   Hypokalemia - will supplement today - BMP in AM  Consultants:  None  Procedures:  None  Antibiotics:  Ciprofloxacin 06/28/2012 --> 06/29/2012  Zosyn 06/29/2012 -->  Blood Cultures:   06/28/2012 --> pending  Urine Culture:  06/28/2012 --> pending  HPI/Subjective: No events overnight.   Objective: Filed Vitals:   06/28/12 2134 06/29/12 0000 06/29/12 0210 06/29/12 0536  BP:  118/47 129/55 144/64  Pulse:  82 78 70  Temp: 100 F (37.8 C) 99 F (37.2 C) 98.6 F (37 C) 98.5 F (36.9 C)  TempSrc: Oral Oral Oral Oral  Resp:  20 19 18   Height:   5\' 6"  (1.676 m)   Weight:   153.543 kg (338 lb 8 oz)   SpO2:  95% 99% 97%    Intake/Output Summary (Last 24 hours) at 06/29/12 1153 Last data filed at 06/29/12 0935  Gross per 24 hour  Intake    880 ml  Output    350 ml  Net    530 ml    Exam:   General:  Pt is alert, follows commands appropriately, not in acute distress  Cardiovascular: Regular rate and rhythm, S1/S2, no murmurs, no rubs, no gallops  Respiratory: Clear to  auscultation bilaterally, no wheezing, no crackles, no rhonchi  Abdomen: Soft, non tender, non distended, bowel sounds present, no guarding  Extremities: No edema, pulses DP and PT palpable bilaterally  Neuro: Grossly nonfocal  Data Reviewed: Basic Metabolic Panel:  Lab 06/29/12 5784 06/28/12 1920  NA 137 134*  K 3.2* 3.8  CL 101 98  CO2 26 25  GLUCOSE 97 102*  BUN 10 10  CREATININE 0.91 0.79  CALCIUM 8.0* 9.2  MG -- 1.9  PHOS -- 2.7   Liver Function Tests:  Lab 06/28/12 1920  AST 21  ALT 33  ALKPHOS 80  BILITOT 0.7  PROT 7.2  ALBUMIN 4.0   CBC:  Lab 06/29/12 0445 06/28/12 1920  WBC 9.9 13.1*  HGB 12.1 13.7  HCT 35.7* 40.0  MCV 91.5 91.1  PLT 222 264   CBG:  Lab 06/29/12 0724  GLUCAP 93     Studies:  Dg Chest 2 View 06/28/2012   IMPRESSION:  Borderline cardiomegaly and vascular congestion.    Scheduled Meds:   . acetaminophen  650 mg Oral Once  . ALPRAZolam  0.5 mg Oral BID  . ciprofloxacin  400 mg Intravenous Q12H  . diclofenac  75 mg Oral BID  . enoxaparin injection  30 mg Subcutaneous QHS  . hydrochlorothiazide  25 mg Oral Daily  . ketorolac  60 mg Intramuscular  Once  . potassium chloride  40 mEq Oral Once   Continuous Infusions:   . DISCONTD: sodium chloride 50 mL/hr at 06/29/12 0035     Code Status: Full  Family Communication: Pt at bedside Disposition Plan: Home in 1-2 days  Debbora Presto, MD  [facility] Pager [phone]  If 7PM-7AM, please contact night-coverage [email or web address] Password [email or web address] 06/29/2012, 11:53 AM   LOS: 1 day

## 2012-06-29 NOTE — Progress Notes (Addendum)
ANTIBIOTIC CONSULT NOTE - INITIAL  Pharmacy Consult for Zosyn  Indication: R/O Pyelonephritis  Allergies  Allergen Reactions  . Amoxicillin-Pot Clavulanate Nausea And Vomiting  . Codeine Other (See Comments)    hallucinations  . Morphine And Related Other (See Comments)    headaches  . Vicodin (Hydrocodone-Acetaminophen) Other (See Comments)    hallucinations  . Latex Rash    Patient Measurements: Height: 5\' 6"  (167.6 cm) (pt. stated) Weight: 338 lb 8 oz (153.543 kg) (bed scale) IBW/kg (Calculated) : 59.3   Vital Signs: Temp: 98.5 F (36.9 C) (07/10 0536) Temp src: Oral (07/10 0536) BP: 144/64 mmHg (07/10 0536) Pulse Rate: 70  (07/10 0536) Intake/Output from previous day: 07/09 0701 - 07/10 0700 In: 640 [P.O.:240; I.V.:400] Out: 350 [Urine:350] Intake/Output from this shift: Total I/O In: 240 [P.O.:240] Out: -   Labs:  Basename 06/29/12 0445 06/28/12 1920  WBC 9.9 13.1*  HGB 12.1 13.7  PLT 222 264  LABCREA -- --  CREATININE 0.91 0.79   Estimated Creatinine Clearance: 107 ml/min (by C-G formula based on Cr of 0.91). No results found for this basename: VANCOTROUGH:2,VANCOPEAK:2,VANCORANDOM:2,GENTTROUGH:2,GENTPEAK:2,GENTRANDOM:2,TOBRATROUGH:2,TOBRAPEAK:2,TOBRARND:2,AMIKACINPEAK:2,AMIKACINTROU:2,AMIKACIN:2, in the last 72 hours   Microbiology: Recent Results (from the past 720 hour(s))  CULTURE, BLOOD (ROUTINE X 2)     Status: Normal (Preliminary result)   Collection Time   06/28/12  7:15 PM      Component Value Range Status Comment   Specimen Description BLOOD LEFT ARM   Final    Special Requests BOTTLES DRAWN AEROBIC AND ANAEROBIC 5CC   Final    Culture  Setup Time 06/28/2012 22:40   Final    Culture     Final    Value:        BLOOD CULTURE RECEIVED NO GROWTH TO DATE CULTURE WILL BE HELD FOR 5 DAYS BEFORE ISSUING A FINAL NEGATIVE REPORT   Report Status PENDING   Incomplete   CULTURE, BLOOD (ROUTINE X 2)     Status: Normal (Preliminary result)   Collection  Time   06/28/12  7:20 PM      Component Value Range Status Comment   Specimen Description BLOOD RIGHT ARM   Final    Special Requests BOTTLES DRAWN AEROBIC AND ANAEROBIC 3CC   Final    Culture  Setup Time 06/28/2012 22:40   Final    Culture     Final    Value:        BLOOD CULTURE RECEIVED NO GROWTH TO DATE CULTURE WILL BE HELD FOR 5 DAYS BEFORE ISSUING A FINAL NEGATIVE REPORT   Report Status PENDING   Incomplete   RAPID STREP SCREEN     Status: Normal   Collection Time   06/28/12  7:34 PM      Component Value Range Status Comment   Streptococcus, Group A Screen (Direct) NEGATIVE  NEGATIVE Final     Medical History: Past Medical History  Diagnosis Date  . Acute lymphadenitis 08/13/2009  . Acute pharyngitis 02/10/2010  . ALLERGIC RHINITIS 08/10/2007  . ANXIETY 12/06/2007  . BACK PAIN 12/06/2007  . CHEST PAIN 08/13/2009  . DEPRESSION 12/06/2007  . DIVERTICULOSIS, [place] 12/06/2007  . GERD 12/06/2007  . HYPERTENSION 12/06/2007  . LOW BACK PAIN 12/06/2007  . MRSA 02/07/2009  . Pain in joint, multiple sites 04/21/2010  . PARONYCHIA, FINGER 08/13/2009  . RASH-NONVESICULAR 05/09/2008  . SHOULDER PAIN, LEFT 05/09/2008  . SINUSITIS- ACUTE-NOS 05/09/2008  . THORACIC/LUMBOSACRAL NEURITIS/RADICULITIS UNSPEC 12/28/2008  . Lumbar disc disease 03/09/2012  . Cervical disc  disease 03/09/2012  . Chronic pain 03/09/2012  . Impaired glucose tolerance 03/13/2012    Medications:  Anti-infectives     Start     Dose/Rate Route Frequency Ordered Stop   06/28/12 2330   ciprofloxacin (CIPRO) IVPB 400 mg  Status:  Discontinued        400 mg 200 mL/hr over 60 Minutes Intravenous Every 12 hours 06/28/12 2328 06/29/12 1209         Assessment: 56 yo F admitted 06/28/12 CC: fever and chills. Started 1 day prior to admission, associated with dysuria and lower abdominal pain. Started on Cipro initially, now changing to Zosyn for persistent fever, r/o pyelo.  Plan:  1) Zosyn 3.375g IV q8h (4h infusion) 2) Follow up  blood and urine Cx.  Darrol Angel, PharmD Pager: [phone] 06/29/2012,12:20 PM   Addendum: Asked to dose Lovenox for VTE prophylaxis.  Since BMI > 30 and CrCl > 76ml/min, will use 75mg  (0.5mg /kg) sq q24h.  Follow CBC, renal function & signs/symptoms of bleeding.  Loralee Pacas, PharmD, BCPS 06/29/2012 4:36 PM

## 2012-06-30 ENCOUNTER — Inpatient Hospital Stay (HOSPITAL_COMMUNITY): Payer: Self-pay

## 2012-06-30 ENCOUNTER — Encounter (HOSPITAL_COMMUNITY): Payer: Self-pay

## 2012-06-30 DIAGNOSIS — J309 Allergic rhinitis, unspecified: Secondary | ICD-10-CM

## 2012-06-30 DIAGNOSIS — K5730 Diverticulosis of large intestine without perforation or abscess without bleeding: Secondary | ICD-10-CM

## 2012-06-30 DIAGNOSIS — R609 Edema, unspecified: Secondary | ICD-10-CM

## 2012-06-30 LAB — CBC
HCT: 38.4 % (ref 36.0–46.0)
RDW: 13.1 % (ref 11.5–15.5)
WBC: 7.8 10*3/uL (ref 4.0–10.5)

## 2012-06-30 LAB — GLUCOSE, CAPILLARY: Glucose-Capillary: 110 mg/dL — ABNORMAL HIGH (ref 70–99)

## 2012-06-30 LAB — BASIC METABOLIC PANEL
BUN: 12 mg/dL (ref 6–23)
Chloride: 104 mEq/L (ref 96–112)
GFR calc Af Amer: 69 mL/min — ABNORMAL LOW (ref >90)
Potassium: 3.9 mEq/L (ref 3.5–5.1)

## 2012-06-30 MED ORDER — IOHEXOL 300 MG/ML  SOLN
100.0000 mL | Freq: Once | INTRAMUSCULAR | Status: AC | PRN
  Administered 2012-06-30: 100 mL via INTRAVENOUS

## 2012-06-30 MED ORDER — VANCOMYCIN HCL 1000 MG IV SOLR
1500.0000 mg | Freq: Two times a day (BID) | INTRAVENOUS | Status: DC
  Administered 2012-06-30 – 2012-07-02 (×4): 1500 mg via INTRAVENOUS
  Filled 2012-06-30 (×4): qty 1500

## 2012-06-30 MED ORDER — METOCLOPRAMIDE HCL 10 MG PO TABS
10.0000 mg | ORAL_TABLET | Freq: Three times a day (TID) | ORAL | Status: DC
  Administered 2012-06-30 – 2012-07-05 (×21): 10 mg via ORAL
  Filled 2012-06-30 (×27): qty 1

## 2012-06-30 NOTE — Progress Notes (Signed)
Patient ID: Jane Canary Mcquinn, female   DOB: 1956/02/13, 56 y.o.   MRN: [id number]  [facility] PROGRESS NOTE  Langston Summerfield Mcgurk [id number]:[id number] DOB: 1956/06/30 DOA: 06/28/2012 PCP: Oliver Barre, MD  Principal Problem:  *Fever  - unclear etiology at this time but no fever overnight - could be related to pyelonephritis that pt was diagnosed last week or now new cellulitis of lower extremity - leukocytosis is resolved at this point  - will continue Zosyn for now, add Vancomycin - urine and blood culture pending to date   Active Problems:  HYPERTENSION  - stable and at goal  - will continue to monitor vitals per floor protocol   Chronic pain  - continue analgesia for adequate pain control   Leukocytosis  - now trending down  - will follow up on CBC in AM   Hypokalemia  - will supplement today  - BMP in AM   Consultants:  None  Procedures:  None  Antibiotics:  Ciprofloxacin 06/28/2012 --> 06/29/2012  Zosyn 06/29/2012 --> Vancomycin 06/30/2012 -->  Blood Cultures:  06/28/2012 --> pending Urine Culture:  06/28/2012 --> pending  HPI/Subjective: No events overnight.  Objective: Filed Vitals:   06/29/12 0536 06/29/12 1340 06/29/12 2200 06/30/12 0600  BP: 144/64 124/70 128/80 169/88  Pulse: 70 72 85 76  Temp: 98.5 F (36.9 C) 97.8 F (36.6 C) 98.2 F (36.8 C) 97.4 F (36.3 C)  TempSrc: Oral Oral Oral Oral  Resp: 18 16 18 18   Height:      Weight:    154.404 kg (340 lb 6.4 oz)  SpO2: 97% 96% 93% 96%    Intake/Output Summary (Last 24 hours) at 06/30/12 1659 Last data filed at 06/30/12 0540  Gross per 24 hour  Intake    422 ml  Output    350 ml  Net     72 ml    Exam:   General:  Pt is alert, follows commands appropriately, not in acute distress  Cardiovascular: Regular rate and rhythm, S1/S2, no murmurs, no rubs, no gallops  Respiratory: Clear to auscultation bilaterally, no wheezing, no crackles, no rhonchi  Abdomen: Soft, non tender, non distended,  bowel sounds present, no guarding  Extremities: Pulses DP and PT palpable bilaterally  Neuro: Grossly nonfocal  Data Reviewed: Basic Metabolic Panel:  Lab 06/30/12 4132 06/29/12 0445 06/28/12 1920  NA 138 137 134*  K 3.9 3.2* 3.8  CL 104 101 98  CO2 25 26 25   GLUCOSE 118* 97 102*  BUN 12 10 10   CREATININE 1.04 0.91 0.79  CALCIUM 8.3* 8.0* 9.2  MG -- -- 1.9  PHOS -- -- 2.7   Liver Function Tests:  Lab 06/28/12 1920  AST 21  ALT 33  ALKPHOS 80  BILITOT 0.7  PROT 7.2  ALBUMIN 4.0   CBC:  Lab 06/30/12 0430 06/29/12 0445 06/28/12 1920  WBC 7.8 9.9 13.1*  NEUTROABS -- -- 10.4*  HGB 12.7 12.1 13.7  HCT 38.4 35.7* 40.0  MCV 93.4 91.5 91.1  PLT 228 222 264   CBG:  Lab 06/30/12 0848 06/29/12 0724  GLUCAP 110* 93    Studies:  Dg Chest 2 View 06/28/2012   IMPRESSION:  Borderline cardiomegaly and vascular congestion.   Ct Abdomen Pelvis W Contrast 06/30/2012  IMPRESSION:  Slight sigmoid diverticulosis.  Hepatic steatosis.  No acute abnormalities. No significant change in the scattered abdominal lymph nodes.    Scheduled Meds:   . ALPRAZolam  0.5 mg Oral BID  . diclofenac  75 mg Oral BID  . enoxaparin (LOVENOX) injection  75 mg Subcutaneous Q24H  . hydrochlorothiazide  25 mg Oral Daily  . metoCLOPramide  10 mg Oral TID AC & HS  . nystatin cream   Topical BID  . piperacillin-tazobactam (ZOSYN)  IV  3.375 g Intravenous Q8H  . sodium chloride  3 mL Intravenous Q12H   Continuous Infusions:    Code Status: Full Family Communication: Pt at bedside Disposition Plan: home in 1-2 days  Debbora Presto, MD  [facility] Pager [phone]  If 7PM-7AM, please contact night-coverage [email or web address] Password [email or web address] 06/30/2012, 4:59 PM   LOS: 2 days

## 2012-06-30 NOTE — Progress Notes (Signed)
ANTIBIOTIC CONSULT NOTE - INITIAL/Follow Up  Pharmacy Consult for Zosyn, vancomycin  Indication: R/O Pyelonephritis, lower extremity cellulitis  Allergies  Allergen Reactions  . Amoxicillin-Pot Clavulanate Nausea And Vomiting  . Codeine Other (See Comments)    hallucinations  . Morphine And Related Other (See Comments)    headaches  . Vicodin (Hydrocodone-Acetaminophen) Other (See Comments)    hallucinations  . Latex Rash    Patient Measurements: Height: 5\' 6"  (167.6 cm) (pt. stated) Weight: 340 lb 6.4 oz (154.404 kg) IBW/kg (Calculated) : 59.3   Vital Signs: Temp: 97.4 F (36.3 C) (07/11 0600) Temp src: Oral (07/11 0600) BP: 169/88 mmHg (07/11 0600) Pulse Rate: 76  (07/11 0600) Intake/Output from previous day: 07/10 0701 - 07/11 0700 In: 902 [P.O.:800; IV Piggyback:102] Out: 1450 [Urine:1450] Intake/Output from this shift:    Labs:  Basename 06/30/12 0430 06/29/12 0445 06/28/12 1920  WBC 7.8 9.9 13.1*  HGB 12.7 12.1 13.7  PLT 228 222 264  LABCREA -- -- --  CREATININE 1.04 0.91 0.79   Estimated Creatinine Clearance: 93.9 ml/min (by C-G formula based on Cr of 1.04). No results found for this basename: VANCOTROUGH:2,VANCOPEAK:2,VANCORANDOM:2,GENTTROUGH:2,GENTPEAK:2,GENTRANDOM:2,TOBRATROUGH:2,TOBRAPEAK:2,TOBRARND:2,AMIKACINPEAK:2,AMIKACINTROU:2,AMIKACIN:2, in the last 72 hours   Microbiology: Recent Results (from the past 720 hour(s))  URINE CULTURE     Status: Normal   Collection Time   06/28/12  6:56 PM      Component Value Range Status Comment   Specimen Description URINE, CLEAN CATCH   Final    Special Requests NONE   Final    Culture  Setup Time 06/28/2012 23:37   Final    Colony Count NO GROWTH   Final    Culture NO GROWTH   Final    Report Status 06/29/2012 FINAL   Final   CULTURE, BLOOD (ROUTINE X 2)     Status: Normal (Preliminary result)   Collection Time   06/28/12  7:15 PM      Component Value Range Status Comment   Specimen Description BLOOD LEFT  ARM   Final    Special Requests BOTTLES DRAWN AEROBIC AND ANAEROBIC 5CC   Final    Culture  Setup Time 06/28/2012 22:40   Final    Culture     Final    Value:        BLOOD CULTURE RECEIVED NO GROWTH TO DATE CULTURE WILL BE HELD FOR 5 DAYS BEFORE ISSUING A FINAL NEGATIVE REPORT   Report Status PENDING   Incomplete   CULTURE, BLOOD (ROUTINE X 2)     Status: Normal (Preliminary result)   Collection Time   06/28/12  7:20 PM      Component Value Range Status Comment   Specimen Description BLOOD RIGHT ARM   Final    Special Requests BOTTLES DRAWN AEROBIC AND ANAEROBIC 3CC   Final    Culture  Setup Time 06/28/2012 22:40   Final    Culture     Final    Value:        BLOOD CULTURE RECEIVED NO GROWTH TO DATE CULTURE WILL BE HELD FOR 5 DAYS BEFORE ISSUING A FINAL NEGATIVE REPORT   Report Status PENDING   Incomplete   RAPID STREP SCREEN     Status: Normal   Collection Time   06/28/12  7:34 PM      Component Value Range Status Comment   Streptococcus, Group A Screen (Direct) NEGATIVE  NEGATIVE Final     Medical History: Past Medical History  Diagnosis Date  . Acute lymphadenitis 08/13/2009  .  Acute pharyngitis 02/10/2010  . ALLERGIC RHINITIS 08/10/2007  . ANXIETY 12/06/2007  . BACK PAIN 12/06/2007  . CHEST PAIN 08/13/2009  . DEPRESSION 12/06/2007  . DIVERTICULOSIS, [place] 12/06/2007  . GERD 12/06/2007  . HYPERTENSION 12/06/2007  . LOW BACK PAIN 12/06/2007  . MRSA 02/07/2009  . Pain in joint, multiple sites 04/21/2010  . PARONYCHIA, FINGER 08/13/2009  . RASH-NONVESICULAR 05/09/2008  . SHOULDER PAIN, LEFT 05/09/2008  . SINUSITIS- ACUTE-NOS 05/09/2008  . THORACIC/LUMBOSACRAL NEURITIS/RADICULITIS UNSPEC 12/28/2008  . Lumbar disc disease 03/09/2012  . Cervical disc disease 03/09/2012  . Chronic pain 03/09/2012  . Impaired glucose tolerance 03/13/2012    Medications:  Anti-infectives     Start     Dose/Rate Route Frequency Ordered Stop   06/29/12 1400   piperacillin-tazobactam (ZOSYN) IVPB 3.375 g          3.375 g 12.5 mL/hr over 240 Minutes Intravenous 3 times per day 06/29/12 1224     06/28/12 2330   ciprofloxacin (CIPRO) IVPB 400 mg  Status:  Discontinued        400 mg 200 mL/hr over 60 Minutes Intravenous Every 12 hours 06/28/12 2328 06/29/12 1209         Assessment:  56 yo F admitted 06/28/12 CC: fever and chills. Started 1 day prior to admission, associated with dysuria and lower abdominal pain.   Day #2 Zosyn for ?pyelo and now will be Day #1 Vancomycin for lower extremity cellulitis  Goal:  Vanc Trough 10-15 mcg/mL  Plan:  1) continue Zosyn 3.375g IV q8h (4h infusion) 2) Start Vancomycin 1500mg  IV q12 3) Will check a trough at steady state 4) Follow up blood and urine Cx.  Hessie Knows, PharmD, BCPS Pager [phone] 06/30/2012 5:12 PM

## 2012-07-01 DIAGNOSIS — R079 Chest pain, unspecified: Secondary | ICD-10-CM

## 2012-07-01 DIAGNOSIS — M5090 Cervical disc disorder, unspecified, unspecified cervical region: Secondary | ICD-10-CM

## 2012-07-01 LAB — BASIC METABOLIC PANEL
CO2: 29 mEq/L (ref 19–32)
Chloride: 102 mEq/L (ref 96–112)
Potassium: 3.8 mEq/L (ref 3.5–5.1)
Sodium: 139 mEq/L (ref 135–145)

## 2012-07-01 LAB — CBC
MCV: 92.8 fL (ref 78.0–100.0)
Platelets: 216 10*3/uL (ref 150–400)
RBC: 4.02 MIL/uL (ref 3.87–5.11)
WBC: 7.3 10*3/uL (ref 4.0–10.5)

## 2012-07-01 LAB — GLUCOSE, CAPILLARY: Glucose-Capillary: 111 mg/dL — ABNORMAL HIGH (ref 70–99)

## 2012-07-01 MED ORDER — LEVOFLOXACIN 500 MG PO TABS
500.0000 mg | ORAL_TABLET | ORAL | Status: DC
  Administered 2012-07-02 – 2012-07-04 (×3): 500 mg via ORAL
  Filled 2012-07-01 (×5): qty 1

## 2012-07-01 MED ORDER — HYDROMORPHONE HCL PF 1 MG/ML IJ SOLN
1.0000 mg | INTRAMUSCULAR | Status: DC | PRN
  Administered 2012-07-01 – 2012-07-04 (×10): 1 mg via INTRAVENOUS
  Filled 2012-07-01 (×10): qty 1

## 2012-07-01 NOTE — Progress Notes (Signed)
Patient ID: Maria [name], female   DOB: 15-Jun-1956, 56 y.o.   MRN: [id number]  [facility] PROGRESS NOTE  Maria [name] [id number]:[id number] DOB: 1956/05/30 DOA: 06/28/2012 PCP: Oliver Barre, MD  Principal Problem:  *Fever, secondary to pyelonephritis   - unclear etiology at this time but no fever overnight  - could be related to pyelonephritis that pt was diagnosed last week or now new cellulitis of lower extremity  - leukocytosis is resolved at this point  - will d/c Zosyn, continue Vancomycin  - urine and blood culture pending to date   Active Problems:  HYPERTENSION  - stable and at goal  - will continue to monitor vitals per floor protocol   Chronic pain  - continue analgesia for adequate pain control   Leukocytosis  - now trending down  - will follow up on CBC in AM   Hypokalemia  - will supplement today  - BMP in AM   Consultants:  None Procedures:  None Antibiotics:  Ciprofloxacin 06/28/2012 --> 06/29/2012  Zosyn 06/29/2012 --> 07/01/2012 Vancomycin 06/30/2012 --> Blood Cultures:  06/28/2012 --> pending Urine Culture:  06/28/2012 --> pending  HPI/Subjective:  No events overnight.  Objective: Filed Vitals:   06/30/12 2230 07/01/12 0615 07/01/12 1200 07/01/12 1423  BP: 119/70 142/67 128/76 155/80  Pulse: 74 73 72 85  Temp: 97.9 F (36.6 C) 98 F (36.7 C) 97.7 F (36.5 C) 97.9 F (36.6 C)  TempSrc: Oral Oral Oral Oral  Resp: 18 18 18 18   Height:      Weight:  154.314 kg (340 lb 3.2 oz)    SpO2: 96% 100% 95% 97%    Intake/Output Summary (Last 24 hours) at 07/01/12 1733 Last data filed at 07/01/12 1424  Gross per 24 hour  Intake   1270 ml  Output   2000 ml  Net   -730 ml    Exam:   General:  Pt is alert, follows commands appropriately, not in acute distress  Cardiovascular: Regular rate and rhythm, S1/S2, no murmurs, no rubs, no gallops  Respiratory: Clear to auscultation bilaterally, no wheezing, no crackles, no rhonchi  Abdomen:  Soft, non tender, non distended, bowel sounds present, no guarding  Extremities: No edema, pulses DP and PT palpable bilaterally, left leg cellulitis improving   Neuro: Grossly nonfocal  Data Reviewed: Basic Metabolic Panel:  Lab 07/01/12 2841 06/30/12 0430 06/29/12 0445 06/28/12 1920  NA 139 138 137 134*  K 3.8 3.9 3.2* 3.8  CL 102 104 101 98  CO2 29 25 26 25   GLUCOSE 90 118* 97 102*  BUN 11 12 10 10   CREATININE 0.90 1.04 0.91 0.79  CALCIUM 8.4 8.3* 8.0* 9.2  MG -- -- -- 1.9  PHOS -- -- -- 2.7   Liver Function Tests:  Lab 06/28/12 1920  AST 21  ALT 33  ALKPHOS 80  BILITOT 0.7  PROT 7.2  ALBUMIN 4.0   CBC:  Lab 07/01/12 0432 06/30/12 0430 06/29/12 0445 06/28/12 1920  WBC 7.3 7.8 9.9 13.1*  NEUTROABS -- -- -- 10.4*  HGB 12.4 12.7 12.1 13.7  HCT 37.3 38.4 35.7* 40.0  MCV 92.8 93.4 91.5 91.1  PLT 216 228 222 264   CBG:  Lab 07/01/12 0854 06/30/12 0848 06/29/12 0724  GLUCAP 111* 110* 93    Recent Results (from the past 240 hour(s))  URINE CULTURE     Status: Normal   Collection Time   06/28/12  6:56 PM      Component Value  Range Status Comment   Specimen Description URINE, CLEAN CATCH   Final    Special Requests NONE   Final    Culture  Setup Time 06/28/2012 23:37   Final    Colony Count NO GROWTH   Final    Culture NO GROWTH   Final    Report Status 06/29/2012 FINAL   Final   CULTURE, BLOOD (ROUTINE X 2)     Status: Normal (Preliminary result)   Collection Time   06/28/12  7:15 PM      Component Value Range Status Comment   Specimen Description BLOOD LEFT ARM   Final    Special Requests BOTTLES DRAWN AEROBIC AND ANAEROBIC 5CC   Final    Culture  Setup Time 06/28/2012 22:40   Final    Culture     Final    Value:        BLOOD CULTURE RECEIVED NO GROWTH TO DATE CULTURE WILL BE HELD FOR 5 DAYS BEFORE ISSUING A FINAL NEGATIVE REPORT   Report Status PENDING   Incomplete   CULTURE, BLOOD (ROUTINE X 2)     Status: Normal (Preliminary result)   Collection Time    06/28/12  7:20 PM      Component Value Range Status Comment   Specimen Description BLOOD RIGHT ARM   Final    Special Requests BOTTLES DRAWN AEROBIC AND ANAEROBIC 3CC   Final    Culture  Setup Time 06/28/2012 22:40   Final    Culture     Final    Value:        BLOOD CULTURE RECEIVED NO GROWTH TO DATE CULTURE WILL BE HELD FOR 5 DAYS BEFORE ISSUING A FINAL NEGATIVE REPORT   Report Status PENDING   Incomplete   RAPID STREP SCREEN     Status: Normal   Collection Time   06/28/12  7:34 PM      Component Value Range Status Comment   Streptococcus, Group A Screen (Direct) NEGATIVE  NEGATIVE Final      Studies: Ct Abdomen Pelvis W Contrast  06/30/2012  *RADIOLOGY REPORT*  Clinical Data: Abdominal pain and tenderness.  Abdominal distention.  Diarrhea.  CT ABDOMEN AND PELVIS WITH CONTRAST  Technique:  Multidetector CT imaging of the abdomen and pelvis was performed following the standard protocol during bolus administration of intravenous contrast.  Contrast: OMNIPAQUE IOHEXOL 300 MG/ML  SOLN  Comparison: 05/23/2012  Findings: There is slight hepatomegaly with hepatic steatosis.  No focal liver lesions.  No biliary tree dilatation.  No visible gallstones.  Spleen, pancreas, adrenal glands, and kidneys are normal.  The bowel appears normal except for scattered diverticula in the distal [place].  No diverticulitis. Uterus has been removed. Normal appearing right ovary.  Left ovary is not identified and may been removed.  There are tiny lymph nodes in the mesentery, unchanged.  There are a few periportal and periaortic lymph nodes which are unchanged as well as several small lymph nodes along the right iliac chain that are unchanged.  IMPRESSION: Slight sigmoid diverticulosis.  Hepatic steatosis.  No acute abnormalities. No significant change in the scattered abdominal lymph nodes.  Original Report Authenticated By: Gwynn Burly, M.D.    Scheduled Meds:   . ALPRAZolam  0.5 mg Oral BID  . diclofenac  75  mg Oral BID  . enoxaparin (LOVENOX) injection  75 mg Subcutaneous Q24H  . hydrochlorothiazide  25 mg Oral Daily  . metoCLOPramide  10 mg Oral TID AC & HS  .  nystatin cream   Topical BID  . piperacillin-tazobactam (ZOSYN)  IV  3.375 g Intravenous Q8H  . sodium chloride  3 mL Intravenous Q12H  . vancomycin  1,500 mg Intravenous Q12H   Continuous Infusions:    Code Status: Full Family Communication: Pt at bedside Disposition Plan: Home when stable  Debbora Presto, MD  [facility] Pager [phone]  If 7PM-7AM, please contact night-coverage [email or web address] Password [facility] 07/01/2012, 5:33 PM   LOS: 3 days

## 2012-07-02 LAB — CBC
Hemoglobin: 12.1 g/dL (ref 12.0–15.0)
RBC: 3.95 MIL/uL (ref 3.87–5.11)
WBC: 6.6 10*3/uL (ref 4.0–10.5)

## 2012-07-02 LAB — BASIC METABOLIC PANEL
CO2: 29 mEq/L (ref 19–32)
Glucose, Bld: 95 mg/dL (ref 70–99)
Potassium: 3.6 mEq/L (ref 3.5–5.1)
Sodium: 140 mEq/L (ref 135–145)

## 2012-07-02 LAB — VANCOMYCIN, TROUGH: Vancomycin Tr: 17.8 ug/mL (ref 10.0–20.0)

## 2012-07-02 MED ORDER — PANTOPRAZOLE SODIUM 40 MG PO TBEC
40.0000 mg | DELAYED_RELEASE_TABLET | Freq: Every day | ORAL | Status: DC
  Administered 2012-07-02 – 2012-07-05 (×4): 40 mg via ORAL
  Filled 2012-07-02 (×3): qty 1

## 2012-07-02 MED ORDER — DOXYCYCLINE HYCLATE 50 MG PO CAPS
100.0000 mg | ORAL_CAPSULE | Freq: Two times a day (BID) | ORAL | Status: DC
  Administered 2012-07-02 – 2012-07-03 (×3): 100 mg via ORAL
  Filled 2012-07-02 (×5): qty 2

## 2012-07-02 MED ORDER — VANCOMYCIN HCL 1000 MG IV SOLR
1250.0000 mg | Freq: Two times a day (BID) | INTRAVENOUS | Status: DC
  Administered 2012-07-02: 1250 mg via INTRAVENOUS
  Filled 2012-07-02: qty 1250

## 2012-07-02 MED ORDER — OXYCODONE-ACETAMINOPHEN 5-325 MG PO TABS
1.0000 | ORAL_TABLET | ORAL | Status: DC | PRN
  Administered 2012-07-02 – 2012-07-04 (×4): 2 via ORAL
  Filled 2012-07-02 (×4): qty 2

## 2012-07-02 NOTE — Progress Notes (Addendum)
ANTIBIOTIC CONSULT NOTE - FOLLOW UP  Pharmacy Consult for Vancomycin Indication: cellulitis  Allergies  Allergen Reactions  . Amoxicillin-Pot Clavulanate Nausea And Vomiting  . Codeine Other (See Comments)    hallucinations  . Morphine And Related Other (See Comments)    headaches  . Vicodin (Hydrocodone-Acetaminophen) Other (See Comments)    hallucinations  . Latex Rash    Patient Measurements: Height: 5\' 6"  (167.6 cm) (pt. stated) Weight: 340 lb 6.2 oz (154.4 kg) IBW/kg (Calculated) : 59.3   Vital Signs: Temp: 98 F (36.7 C) (07/13 0630) Temp src: Oral (07/13 0630) BP: 133/61 mmHg (07/13 0630) Pulse Rate: 75  (07/13 0630)  Labs:  Basename 07/02/12 0625 07/01/12 0432 06/30/12 0430  WBC 6.6 7.3 7.8  HGB 12.1 12.4 12.7  PLT 254 216 228  LABCREA -- -- --  CREATININE 0.94 0.90 1.04   Estimated Creatinine Clearance: 103.9 ml/min (by C-G formula based on Cr of 0.94).  Basename 07/02/12 0625  VANCOTROUGH 17.8  VANCOPEAK --  VANCORANDOM --  GENTTROUGH --  GENTPEAK --  GENTRANDOM --  TOBRATROUGH --  TOBRAPEAK --  TOBRARND --  AMIKACINPEAK --  AMIKACINTROU --  AMIKACIN --     Microbiology: Recent Results (from the past 720 hour(s))  URINE CULTURE     Status: Normal   Collection Time   06/28/12  6:56 PM      Component Value Range Status Comment   Specimen Description URINE, CLEAN CATCH   Final    Special Requests NONE   Final    Culture  Setup Time 06/28/2012 23:37   Final    Colony Count NO GROWTH   Final    Culture NO GROWTH   Final    Report Status 06/29/2012 FINAL   Final   CULTURE, BLOOD (ROUTINE X 2)     Status: Normal (Preliminary result)   Collection Time   06/28/12  7:15 PM      Component Value Range Status Comment   Specimen Description BLOOD LEFT ARM   Final    Special Requests BOTTLES DRAWN AEROBIC AND ANAEROBIC 5CC   Final    Culture  Setup Time 06/28/2012 22:40   Final    Culture     Final    Value:        BLOOD CULTURE RECEIVED NO GROWTH TO  DATE CULTURE WILL BE HELD FOR 5 DAYS BEFORE ISSUING A FINAL NEGATIVE REPORT   Report Status PENDING   Incomplete   CULTURE, BLOOD (ROUTINE X 2)     Status: Normal (Preliminary result)   Collection Time   06/28/12  7:20 PM      Component Value Range Status Comment   Specimen Description BLOOD RIGHT ARM   Final    Special Requests BOTTLES DRAWN AEROBIC AND ANAEROBIC 3CC   Final    Culture  Setup Time 06/28/2012 22:40   Final    Culture     Final    Value:        BLOOD CULTURE RECEIVED NO GROWTH TO DATE CULTURE WILL BE HELD FOR 5 DAYS BEFORE ISSUING A FINAL NEGATIVE REPORT   Report Status PENDING   Incomplete   RAPID STREP SCREEN     Status: Normal   Collection Time   06/28/12  7:34 PM      Component Value Range Status Comment   Streptococcus, Group A Screen (Direct) NEGATIVE  NEGATIVE Final     Assessment: 56 yo F admitted 06/28/12 CC: fever and chills. Associated with  dysuria and lower abdominal pain. Zosyn switched to PO Levaquin for pyelonephritis that was diagnosed prior to admission.  Day #3 vancomycin that was added for coverage of cellulitis of lower extremity.  Renal function stable.  Vancomycin trough returned higher than goal.  Goal of Therapy:  Vancomycin trough level 10-15 mcg/ml  Plan:  Adjust Vancomycin to 1250 mg IV q12h.  Maria [name] 07/02/2012,7:48 AM

## 2012-07-02 NOTE — Progress Notes (Signed)
Patient ID: Maria [name], female   DOB: 1956-09-23, 56 y.o.   MRN: [id number]  [facility] PROGRESS NOTE  Maria [name] [id number] DOB: 1956/03/07 DOA: 06/28/2012 PCP: Oliver Barre, MD  Principal Problem:  *Fever, secondary to pyelonephritis  - unclear etiology at this time but no fever overnight  - could be related to pyelonephritis that pt was diagnosed last week or now new cellulitis of lower extremity  - leukocytosis is resolved at this point  - will d/c Zosyn, continue Vancomycin  - urine and blood culture pending to date   Active Problems:  HYPERTENSION  - stable and at goal  - will continue to monitor vitals per floor protocol   Chronic pain  - continue analgesia for adequate pain control   Leukocytosis  - now trending down  - will follow up on CBC in AM   Hypokalemia  - will supplement today  - BMP in AM   Consultants:  None  Procedures:  None  Antibiotics:  Ciprofloxacin 06/28/2012 --> 06/29/2012  Zosyn 06/29/2012 --> 07/01/2012  Vancomycin 06/30/2012 -->  Blood Cultures:  06/28/2012 --> pending  Urine Culture:  06/28/2012 --> pending  HPI/Subjective:  No events overnight.   Objective: Filed Vitals:   07/01/12 1423 07/01/12 2232 07/02/12 0630 07/02/12 1449  BP: 155/80 140/54 133/61 124/59  Pulse: 85 81 75 76  Temp: 97.9 F (36.6 C) 98.5 F (36.9 C) 98 F (36.7 C) 98 F (36.7 C)  TempSrc: Oral Oral Oral Oral  Resp: 18 18 18 16   Height:      Weight:   154.4 kg (340 lb 6.2 oz)   SpO2: 97% 98% 95% 96%    Intake/Output Summary (Last 24 hours) at 07/02/12 1720 Last data filed at 07/02/12 1450  Gross per 24 hour  Intake    943 ml  Output      0 ml  Net    943 ml    Exam:   General:  Pt is alert, follows commands appropriately, not in acute distress  Cardiovascular: Regular rate and rhythm, S1/S2, no murmurs, no rubs, no gallops  Respiratory: Clear to auscultation bilaterally, no wheezing, no crackles, no  rhonchi  Abdomen: Soft, non tender, non distended, bowel sounds present, no guarding  Extremities: No edema, pulses DP and PT palpable bilaterally  Neuro: Grossly nonfocal  Data Reviewed: Basic Metabolic Panel:  Lab 07/02/12 9562 07/01/12 0432 06/30/12 0430 06/29/12 0445 06/28/12 1920  NA 140 139 138 137 134*  K 3.6 3.8 3.9 3.2* 3.8  CL 103 102 104 101 98  CO2 29 29 25 26 25   GLUCOSE 95 90 118* 97 102*  BUN 9 11 12 10 10   CREATININE 0.94 0.90 1.04 0.91 0.79  CALCIUM 8.4 8.4 8.3* 8.0* 9.2  MG -- -- -- -- 1.9  PHOS -- -- -- -- 2.7   Liver Function Tests:  Lab 06/28/12 1920  AST 21  ALT 33  ALKPHOS 80  BILITOT 0.7  PROT 7.2  ALBUMIN 4.0   CBC:  Lab 07/02/12 0625 07/01/12 0432 06/30/12 0430 06/29/12 0445 06/28/12 1920  WBC 6.6 7.3 7.8 9.9 13.1*  NEUTROABS -- -- -- -- 10.4*  HGB 12.1 12.4 12.7 12.1 13.7  HCT 36.9 37.3 38.4 35.7* 40.0  MCV 93.4 92.8 93.4 91.5 91.1  PLT 254 216 228 222 264   CBG:  Lab 07/02/12 0808 07/01/12 0854 06/30/12 0848 06/29/12 0724  GLUCAP 107* 111* 110* 93    Recent Results (from the past  240 hour(s))  URINE CULTURE     Status: Normal   Collection Time   06/28/12  6:56 PM      Component Value Range Status Comment   Specimen Description URINE, CLEAN CATCH   Final    Special Requests NONE   Final    Culture  Setup Time 06/28/2012 23:37   Final    Colony Count NO GROWTH   Final    Culture NO GROWTH   Final    Report Status 06/29/2012 FINAL   Final   CULTURE, BLOOD (ROUTINE X 2)     Status: Normal (Preliminary result)   Collection Time   06/28/12  7:15 PM      Component Value Range Status Comment   Specimen Description BLOOD LEFT ARM   Final    Special Requests BOTTLES DRAWN AEROBIC AND ANAEROBIC 5CC   Final    Culture  Setup Time 06/28/2012 22:40   Final    Culture     Final    Value:        BLOOD CULTURE RECEIVED NO GROWTH TO DATE CULTURE WILL BE HELD FOR 5 DAYS BEFORE ISSUING A FINAL NEGATIVE REPORT   Report Status PENDING    Incomplete   CULTURE, BLOOD (ROUTINE X 2)     Status: Normal (Preliminary result)   Collection Time   06/28/12  7:20 PM      Component Value Range Status Comment   Specimen Description BLOOD RIGHT ARM   Final    Special Requests BOTTLES DRAWN AEROBIC AND ANAEROBIC 3CC   Final    Culture  Setup Time 06/28/2012 22:40   Final    Culture     Final    Value:        BLOOD CULTURE RECEIVED NO GROWTH TO DATE CULTURE WILL BE HELD FOR 5 DAYS BEFORE ISSUING A FINAL NEGATIVE REPORT   Report Status PENDING   Incomplete   RAPID STREP SCREEN     Status: Normal   Collection Time   06/28/12  7:34 PM      Component Value Range Status Comment   Streptococcus, Group A Screen (Direct) NEGATIVE  NEGATIVE Final      Studies: No results found.  Scheduled Meds:   . ALPRAZolam  0.5 mg Oral BID  . diclofenac  75 mg Oral BID  . enoxaparin injection  75 mg Subcutaneous Q24H  . hydrochlorothiazide  25 mg Oral Daily  . levofloxacin  500 mg Oral Q24H  . metoCLOPramide  10 mg Oral TID AC & HS  . vancomycin  1,250 mg Intravenous Q12H   Continuous Infusions:     Code Status: Full Family Communication: Pt at bedside Disposition Plan: Home in 1-2 days  Debbora Presto, MD  [facility] Pager [phone]  If 7PM-7AM, please contact night-coverage [email or web address] Password [email or web address] 07/02/2012, 5:20 PM   LOS: 4 days

## 2012-07-03 MED ORDER — POLYETHYLENE GLYCOL 3350 17 G PO PACK
17.0000 g | PACK | Freq: Every day | ORAL | Status: DC
  Administered 2012-07-03: 17 g via ORAL
  Filled 2012-07-03 (×2): qty 1

## 2012-07-03 MED ORDER — SENNOSIDES-DOCUSATE SODIUM 8.6-50 MG PO TABS
1.0000 | ORAL_TABLET | Freq: Two times a day (BID) | ORAL | Status: DC
  Administered 2012-07-03 – 2012-07-04 (×2): 1 via ORAL
  Filled 2012-07-03 (×3): qty 1

## 2012-07-03 NOTE — Progress Notes (Signed)
Patient ID: Maria [name], female   DOB: 28-Sep-1956, 56 y.o.   MRN: [id number]  [facility] PROGRESS NOTE  Sunnie Odden Cubillos WUJ:[id number] DOB: [birth year] DOA: 06/28/2012 PCP: Oliver Barre, MD  Principal Problem:  *Fever, secondary to pyelonephritis  - unclear etiology at this time but no fever overnight  - could be related to pyelonephritis that pt was diagnosed last week or now new cellulitis of lower extremity  - leukocytosis is resolved at this point  - will d/c Zosyn, change abx to PO - urine and blood culture negative to date  Active Problems:  HYPERTENSION  - stable and at goal  - will continue to monitor vitals per floor protocol   Chronic pain  - continue analgesia for adequate pain control  - convert to PO  Leukocytosis  - now trending down  - will follow up on CBC in AM   Hypokalemia  - will supplement as indicated - BMP in AM   Consultants:  None   Procedures:  None  Antibiotics:  Ciprofloxacin 06/28/2012 --> 06/29/2012  Zosyn 06/29/2012 --> 07/01/2012  Vancomycin 06/30/2012 --> 07/03/2012 Doxycycline 07/02/2012 --> Levaquin 07/02/2012 -->  Blood Cultures:  06/28/2012 --> NGTD  Urine Culture:  06/28/2012 --> NEG  HPI/Subjective: No events overnight.   Objective: Filed Vitals:   07/02/12 1449 07/02/12 2217 07/03/12 0652 07/03/12 1432  BP: 124/59 144/76 142/69 139/71  Pulse: 76 70 71 76  Temp: 98 F (36.7 C) 98.5 F (36.9 C) 97.7 F (36.5 C) 97.6 F (36.4 C)  TempSrc: Oral Oral Oral Oral  Resp: 16 16 18 18   Height:      Weight:   159.757 kg (352 lb 3.2 oz)   SpO2: 96% 95% 99% 98%    Intake/Output Summary (Last 24 hours) at 07/03/12 1433 Last data filed at 07/03/12 1300  Gross per 24 hour  Intake    723 ml  Output      0 ml  Net    723 ml    Exam:   General:  Pt is alert, follows commands appropriately, not in acute distress  Cardiovascular: Regular rate and rhythm, S1/S2, no murmurs, no rubs, no gallops  Respiratory:  Clear to auscultation bilaterally, no wheezing, no crackles, no rhonchi  Abdomen: Soft, non tender, non distended, bowel sounds present, no guarding  Extremities: No edema, pulses DP and PT palpable bilaterally  Neuro: Grossly nonfocal  Data Reviewed: Basic Metabolic Panel:  Lab 07/02/12 9562 07/01/12 0432 06/30/12 0430 06/29/12 0445 06/28/12 1920  NA 140 139 138 137 134*  K 3.6 3.8 3.9 3.2* 3.8  CL 103 102 104 101 98  CO2 29 29 25 26 25   GLUCOSE 95 90 118* 97 102*  BUN 9 11 12 10 10   CREATININE 0.94 0.90 1.04 0.91 0.79  CALCIUM 8.4 8.4 8.3* 8.0* 9.2  MG -- -- -- -- 1.9  PHOS -- -- -- -- 2.7   Liver Function Tests:  Lab 06/28/12 1920  AST 21  ALT 33  ALKPHOS 80  BILITOT 0.7  PROT 7.2  ALBUMIN 4.0   CBC:  Lab 07/02/12 0625 07/01/12 0432 06/30/12 0430 06/29/12 0445 06/28/12 1920  WBC 6.6 7.3 7.8 9.9 13.1*  NEUTROABS -- -- -- -- 10.4*  HGB 12.1 12.4 12.7 12.1 13.7  HCT 36.9 37.3 38.4 35.7* 40.0  MCV 93.4 92.8 93.4 91.5 91.1  PLT 254 216 228 222 264   CBG:  Lab 07/03/12 0742 07/02/12 1308 07/01/12 0854 06/30/12 0848 06/29/12 6578  GLUCAP 84 107* 111* 110* 93   Scheduled Meds:   . ALPRAZolam  0.5 mg Oral BID  . diclofenac  75 mg Oral BID  . doxycycline  100 mg Oral Q12H  . enoxaparin (LOVENOX) injection  75 mg Subcutaneous Q24H  . hydrochlorothiazide  25 mg Oral Daily  . levofloxacin  500 mg Oral Q24H  . metoCLOPramide  10 mg Oral TID AC & HS  . nystatin cream   Topical BID  . pantoprazole  40 mg Oral Q1200  . sodium chloride  3 mL Intravenous Q12H  . DISCONTD: vancomycin  1,250 mg Intravenous Q12H   Continuous Infusions:     Code Status: Full Family Communication: Pt at bedside Disposition Plan: Home when medically stable  Debbora Presto, MD  [facility] Pager [phone]  If 7PM-7AM, please contact night-coverage [email or web address] Password [email or web address] 07/03/2012, 2:33 PM   LOS: 5 days

## 2012-07-04 LAB — CULTURE, BLOOD (ROUTINE X 2)
Culture: NO GROWTH
Culture: NO GROWTH

## 2012-07-04 MED ORDER — HYDROMORPHONE HCL 2 MG PO TABS
1.0000 mg | ORAL_TABLET | ORAL | Status: DC | PRN
  Administered 2012-07-04 (×2): 1 mg via ORAL
  Filled 2012-07-04 (×2): qty 1

## 2012-07-04 MED ORDER — SENNOSIDES-DOCUSATE SODIUM 8.6-50 MG PO TABS
1.0000 | ORAL_TABLET | Freq: Every day | ORAL | Status: DC
  Administered 2012-07-05: 1 via ORAL
  Filled 2012-07-04: qty 1

## 2012-07-04 MED ORDER — DOXYCYCLINE HYCLATE 100 MG PO TABS
50.0000 mg | ORAL_TABLET | Freq: Two times a day (BID) | ORAL | Status: DC
  Administered 2012-07-04 – 2012-07-05 (×3): 50 mg via ORAL
  Filled 2012-07-04 (×4): qty 0.5

## 2012-07-04 MED ORDER — OXYCODONE-ACETAMINOPHEN 5-325 MG PO TABS
1.0000 | ORAL_TABLET | ORAL | Status: DC | PRN
  Administered 2012-07-05 (×2): 2 via ORAL
  Filled 2012-07-04 (×3): qty 2

## 2012-07-04 NOTE — Progress Notes (Signed)
Patient ID: Maria [name], female   DOB: [birth year], 56 y.o.   MRN: [id number]  [facility] PROGRESS NOTE  Junette Bernat Piazza WUJ:[id number] DOB: [birth year] DOA: 06/28/2012 PCP: Oliver Barre, MD   Principal Problem:  *Fever, secondary to pyelonephritis  - unclear etiology at this time but no fever overnight  - could be related to pyelonephritis that pt was diagnosed last week or now new cellulitis of lower extremity  - leukocytosis is resolved at this point  - will d/c Zosyn, change abx to PO  - urine and blood culture negative to date   Active Problems:  HYPERTENSION  - stable and at goal  - will continue to monitor vitals per floor protocol   Chronic pain  - continue analgesia for adequate pain control  - convert to PO   Leukocytosis  - now trending down  - will follow up on CBC in AM   Hypokalemia  - will supplement as indicated  - BMP in AM   Consultants:  None Procedures:  None Antibiotics:  Ciprofloxacin 06/28/2012 --> 06/29/2012  Zosyn 06/29/2012 --> 07/01/2012  Vancomycin 06/30/2012 --> 07/03/2012  Doxycycline 07/02/2012 -->  Levaquin 07/02/2012 -->  Blood Cultures:  06/28/2012 --> NGTD  Urine Culture:  06/28/2012 --> NEG  HPI/Subjective:  No events overnight.   Objective: Filed Vitals:   07/03/12 2245 07/04/12 0636 07/04/12 0955 07/04/12 1458  BP: 158/73 149/72 166/68 151/69  Pulse: 70 71 76 75  Temp: 98.4 F (36.9 C) 98 F (36.7 C) 98.1 F (36.7 C) 98.2 F (36.8 C)  TempSrc: Oral Oral Oral Oral  Resp: 18 18 18 18   Height:      Weight:  154.223 kg (340 lb)    SpO2: 98% 96%  96%    Intake/Output Summary (Last 24 hours) at 07/04/12 1720 Last data filed at 07/04/12 1000  Gross per 24 hour  Intake    500 ml  Output      0 ml  Net    500 ml    Exam:   General:  Pt is alert, follows commands appropriately, not in acute distress  Cardiovascular: Regular rate and rhythm, S1/S2, no murmurs, no rubs, no gallops  Respiratory: Clear to  auscultation bilaterally, no wheezing, no crackles, no rhonchi  Abdomen: Soft, non tender, non distended, bowel sounds present, no guarding  Extremities: No edema, pulses DP and PT palpable bilaterally  Neuro: Grossly nonfocal  Data Reviewed: Basic Metabolic Panel:  Lab 07/02/12 9562 07/01/12 0432 06/30/12 0430 06/29/12 0445 06/28/12 1920  NA 140 139 138 137 134*  K 3.6 3.8 3.9 3.2* 3.8  CL 103 102 104 101 98  CO2 29 29 25 26 25   GLUCOSE 95 90 118* 97 102*  BUN 9 11 12 10 10   CREATININE 0.94 0.90 1.04 0.91 0.79  CALCIUM 8.4 8.4 8.3* 8.0* 9.2  MG -- -- -- -- 1.9  PHOS -- -- -- -- 2.7   Liver Function Tests:  Lab 06/28/12 1920  AST 21  ALT 33  ALKPHOS 80  BILITOT 0.7  PROT 7.2  ALBUMIN 4.0   CBC:  Lab 07/02/12 0625 07/01/12 0432 06/30/12 0430 06/29/12 0445 06/28/12 1920  WBC 6.6 7.3 7.8 9.9 13.1*  NEUTROABS -- -- -- -- 10.4*  HGB 12.1 12.4 12.7 12.1 13.7  HCT 36.9 37.3 38.4 35.7* 40.0  MCV 93.4 92.8 93.4 91.5 91.1  PLT 254 216 228 222 264   CBG:  Lab 07/04/12 0556 07/03/12 0742 07/02/12 0808 07/01/12 1308  06/30/12 0848  GLUCAP 96 84 107* 111* 110*    Recent Results (from the past 240 hour(s))  URINE CULTURE     Status: Normal   Collection Time   06/28/12  6:56 PM      Component Value Range Status Comment   Specimen Description URINE, CLEAN CATCH   Final    Special Requests NONE   Final    Culture  Setup Time 06/28/2012 23:37   Final    Colony Count NO GROWTH   Final    Culture NO GROWTH   Final    Report Status 06/29/2012 FINAL   Final   CULTURE, BLOOD (ROUTINE X 2)     Status: Normal   Collection Time   06/28/12  7:15 PM      Component Value Range Status Comment   Specimen Description BLOOD LEFT ARM   Final    Special Requests BOTTLES DRAWN AEROBIC AND ANAEROBIC 5CC   Final    Culture  Setup Time 06/28/2012 22:40   Final    Culture NO GROWTH 5 DAYS   Final    Report Status 07/04/2012 FINAL   Final   CULTURE, BLOOD (ROUTINE X 2)     Status: Normal    Collection Time   06/28/12  7:20 PM      Component Value Range Status Comment   Specimen Description BLOOD RIGHT ARM   Final    Special Requests BOTTLES DRAWN AEROBIC AND ANAEROBIC 3CC   Final    Culture  Setup Time 06/28/2012 22:40   Final    Culture NO GROWTH 5 DAYS   Final    Report Status 07/04/2012 FINAL   Final   RAPID STREP SCREEN     Status: Normal   Collection Time   06/28/12  7:34 PM      Component Value Range Status Comment   Streptococcus, Group A Screen (Direct) NEGATIVE  NEGATIVE Final      Studies: No results found.  Scheduled Meds:   . ALPRAZolam  0.5 mg Oral BID  . diclofenac  75 mg Oral BID  . doxycycline  50 mg Oral Q12H  . enoxaparin (LOVENOX) injection  75 mg Subcutaneous Q24H  . hydrochlorothiazide  25 mg Oral Daily  . levofloxacin  500 mg Oral Q24H  . metoCLOPramide  10 mg Oral TID AC & HS  . nystatin cream   Topical BID  . pantoprazole  40 mg Oral Q1200  . senna-docusate  1 tablet Oral Daily  . DISCONTD: doxycycline  100 mg Oral Q12H  . DISCONTD: polyethylene glycol  17 g Oral Daily  . DISCONTD: senna-docusate  1 tablet Oral BID  . DISCONTD: sodium chloride  3 mL Intravenous Q12H   Continuous Infusions:    Code Status: Full Family Communication: Pt at bedside Disposition Plan: Home when medically stable  Debbora Presto, MD  [facility] Pager [phone]  If 7PM-7AM, please contact night-coverage [email or web address] Password [email or web address] 07/04/2012, 5:20 PM   LOS: 6 days

## 2012-07-05 MED ORDER — ONDANSETRON HCL 4 MG PO TABS: 4.0000 mg | ORAL_TABLET | Freq: Four times a day (QID) | ORAL | Status: AC | PRN

## 2012-07-05 MED ORDER — METOCLOPRAMIDE HCL 10 MG PO TABS: 10.0000 mg | ORAL_TABLET | Freq: Three times a day (TID) | ORAL | Status: DC

## 2012-07-05 MED ORDER — HYDROMORPHONE HCL 2 MG PO TABS: 2.0000 mg | ORAL_TABLET | ORAL | Status: AC | PRN

## 2012-07-05 MED ORDER — DOXYCYCLINE HYCLATE 100 MG PO TABS: 50.0000 mg | ORAL_TABLET | Freq: Two times a day (BID) | ORAL | Status: DC

## 2012-07-05 MED ORDER — LEVOFLOXACIN 500 MG PO TABS: 500.0000 mg | ORAL_TABLET | ORAL | Status: AC

## 2012-07-05 MED ORDER — ALPRAZOLAM 0.5 MG PO TABS: 0.5000 mg | ORAL_TABLET | Freq: Two times a day (BID) | ORAL | Status: DC

## 2012-07-05 NOTE — Discharge Summary (Signed)
Physician Discharge Summary  Brunilda Eble Schimming ONG:[id number] DOB: [birth year] DOA: 06/28/2012  PCP: Oliver Barre, MD  Admit date: 06/28/2012 Discharge date: 07/05/2012  Recommendations for Outpatient Follow-up:  1. Pt will need to see PCP in 3-4 weeks post discharge 2. Please note that we have added Metoclopramide to pt's regimen and she has responded well and her nausea was resolved 3. Pt was also discharged home on antibiotics to complete the therapy for pyelonephritis and cellulitis  Discharge Diagnoses: Fever, multifactorial and secondary to pyelonephritis and cellulitis of the left lower extremity   Principal Problem:  *Fever Active Problems:  HYPERTENSION  Chronic pain  Leukocytosis  Hypokalemia  Discharge Condition: Stable  Diet recommendation: Regular  History of present illness:  Pt is 56 yo female who presents to [facility] ED with main concern of new onset fevers, chills, fatigue, that initially started one day prior to admission and associated with dysuria and lower area abdominal pain, she describes it as dull, constant, non radiating, 7/10 in severity. She reports being diagnosed with kidney infection approximately one month prior to admission and has been treated with antibiotic but is unsure of the name. She denies any specific aggravating or alleviating factors, no vomiting, no other abdominal or urinary concerns other than the ones mentioned. She denies any specific focal neurologic symptoms, no other systemic symptoms, no headaches, no visual changes.   Hospital Course:  Principal Problem:  *Fever, secondary to pyelonephritis  - unclear etiology at this time but no fever overnight  - could be related to pyelonephritis that pt was diagnosed last week or now new cellulitis of left lower extremity  - leukocytosis is resolved at this point  - changed abx to PO  - urine and blood culture negative to date   Active Problems:  HYPERTENSION  - stable and at goal   Chronic pain  -  continue analgesia for adequate pain control  - convert to PO   Leukocytosis  - now trending down and within normal limits this AM  Hypokalemia  - this was adequately supplemented   Consultants:  None  Procedures:  None  Antibiotics:  Ciprofloxacin 06/28/2012 --> 06/29/2012  Zosyn 06/29/2012 --> 07/01/2012  Vancomycin 06/30/2012 --> 07/03/2012  Doxycycline 07/02/2012 -->  Levaquin 07/02/2012 -->  Blood Cultures:  06/28/2012 --> NGTD  Urine Culture:  06/28/2012 --> NEG  Discharge Exam: Filed Vitals:   07/05/12 0602  BP: 142/64  Pulse: 69  Temp: 98 F (36.7 C)  Resp: 19   Filed Vitals:   07/04/12 0955 07/04/12 1458 07/04/12 2124 07/05/12 0602  BP: 166/68 151/69 154/64 142/64  Pulse: 76 75 66 69  Temp: 98.1 F (36.7 C) 98.2 F (36.8 C) 98.2 F (36.8 C) 98 F (36.7 C)  TempSrc: Oral Oral Oral Oral  Resp: 18 18 19 19   Height:      Weight:      SpO2:  96% 98% 96%    General: Pt is alert, follows commands appropriately, not in acute distress Cardiovascular: Regular rate and rhythm, S1/S2 +, no murmurs, no rubs, no gallops Respiratory: Clear to auscultation bilaterally, no wheezing, no crackles, no rhonchi Abdominal: Soft, non tender, non distended, bowel sounds +, no guarding Extremities: no edema, no cyanosis, pulses palpable bilaterally DP and PT Neuro: Grossly nonfocal  Discharge Instructions  Discharge Orders    Future Orders Please Complete By Expires   Diet - low sodium heart healthy      Increase activity slowly  Medication List  As of 07/05/2012 12:02 PM   STOP taking these medications         oxyCODONE-acetaminophen 5-325 MG per tablet         TAKE these medications         ALPRAZolam 0.5 MG tablet   Commonly known as: XANAX   Take 1 tablet (0.5 mg total) by mouth 2 (two) times daily.      diclofenac 75 MG EC tablet   Commonly known as: VOLTAREN   Take 75 mg by mouth 2 (two) times daily.      doxycycline 100 MG tablet    Commonly known as: VIBRA-TABS   Take 0.5 tablets (50 mg total) by mouth every 12 (twelve) hours.      hydrochlorothiazide 25 MG tablet   Commonly known as: HYDRODIURIL   Take 25 mg by mouth daily.      HYDROmorphone 2 MG tablet   Commonly known as: DILAUDID   Take 1 tablet (2 mg total) by mouth every 4 (four) hours as needed for pain.      levofloxacin 500 MG tablet   Commonly known as: LEVAQUIN   Take 1 tablet (500 mg total) by mouth daily.      metoCLOPramide 10 MG tablet   Commonly known as: REGLAN   Take 1 tablet (10 mg total) by mouth 4 (four) times daily -  before meals and at bedtime.      nystatin cream   Commonly known as: MYCOSTATIN   Apply to affected area 2 times daily      ondansetron 4 MG tablet   Commonly known as: ZOFRAN   Take 1 tablet (4 mg total) by mouth every 6 (six) hours as needed for nausea.      potassium chloride 10 MEQ tablet   Commonly known as: K-DUR,KLOR-CON   Take 10 mEq by mouth 2 (two) times daily.           Follow-up Information    Follow up with Oliver Barre, MD in 4 weeks.   Contact information:   [address] [facility] [address] [place] [address] [phone]           The results of significant diagnostics from this hospitalization (including imaging, microbiology, ancillary and laboratory) are listed below for reference.    Significant Diagnostic Studies: Dg Chest 2 View  06/28/2012  *RADIOLOGY REPORT*  Clinical Data: Short of breath and chills.  CHEST - 2 VIEW  Comparison: 05/23/2012  Findings: Borderline cardiomegaly.  No pneumothorax or pleural effusion.  Vascular congestion.  No consolidation.  IMPRESSION: Borderline cardiomegaly and vascular congestion.  Original Report Authenticated By: Donavan Burnet, M.D.   Ct Abdomen Pelvis W Contrast  06/30/2012  *RADIOLOGY REPORT*  Clinical Data: Abdominal pain and tenderness.  Abdominal distention.  Diarrhea.  CT ABDOMEN AND PELVIS WITH CONTRAST  Technique:   Multidetector CT imaging of the abdomen and pelvis was performed following the standard protocol during bolus administration of intravenous contrast.  Contrast: OMNIPAQUE IOHEXOL 300 MG/ML  SOLN  Comparison: 05/23/2012  Findings: There is slight hepatomegaly with hepatic steatosis.  No focal liver lesions.  No biliary tree dilatation.  No visible gallstones.  Spleen, pancreas, adrenal glands, and kidneys are normal.  The bowel appears normal except for scattered diverticula in the distal [place].  No diverticulitis. Uterus has been removed. Normal appearing right ovary.  Left ovary is not identified and may been removed.  There are  tiny lymph nodes in the mesentery, unchanged.  There are a few periportal and periaortic lymph nodes which are unchanged as well as several small lymph nodes along the right iliac chain that are unchanged.  IMPRESSION: Slight sigmoid diverticulosis.  Hepatic steatosis.  No acute abnormalities. No significant change in the scattered abdominal lymph nodes.  Original Report Authenticated By: Gwynn Burly, M.D.    Microbiology: Recent Results (from the past 240 hour(s))  URINE CULTURE     Status: Normal   Collection Time   06/28/12  6:56 PM      Component Value Range Status Comment   Specimen Description URINE, CLEAN CATCH   Final    Special Requests NONE   Final    Culture  Setup Time 06/28/2012 23:37   Final    Colony Count NO GROWTH   Final    Culture NO GROWTH   Final    Report Status 06/29/2012 FINAL   Final   CULTURE, BLOOD (ROUTINE X 2)     Status: Normal   Collection Time   06/28/12  7:15 PM      Component Value Range Status Comment   Specimen Description BLOOD LEFT ARM   Final    Special Requests BOTTLES DRAWN AEROBIC AND ANAEROBIC 5CC   Final    Culture  Setup Time 06/28/2012 22:40   Final    Culture NO GROWTH 5 DAYS   Final    Report Status 07/04/2012 FINAL   Final   CULTURE, BLOOD (ROUTINE X 2)     Status: Normal   Collection Time   06/28/12  7:20 PM       Component Value Range Status Comment   Specimen Description BLOOD RIGHT ARM   Final    Special Requests BOTTLES DRAWN AEROBIC AND ANAEROBIC 3CC   Final    Culture  Setup Time 06/28/2012 22:40   Final    Culture NO GROWTH 5 DAYS   Final    Report Status 07/04/2012 FINAL   Final   RAPID STREP SCREEN     Status: Normal   Collection Time   06/28/12  7:34 PM      Component Value Range Status Comment   Streptococcus, Group A Screen (Direct) NEGATIVE  NEGATIVE Final      Labs: Basic Metabolic Panel:  Lab 07/02/12 2130 07/01/12 0432 06/30/12 0430 06/29/12 0445 06/28/12 1920  NA 140 139 138 137 134*  K 3.6 3.8 3.9 3.2* 3.8  CL 103 102 104 101 98  CO2 29 29 25 26 25   GLUCOSE 95 90 118* 97 102*  BUN 9 11 12 10 10   CREATININE 0.94 0.90 1.04 0.91 0.79  CALCIUM 8.4 8.4 8.3* 8.0* 9.2  MG -- -- -- -- 1.9  PHOS -- -- -- -- 2.7   Liver Function Tests:  Lab 06/28/12 1920  AST 21  ALT 33  ALKPHOS 80  BILITOT 0.7  PROT 7.2  ALBUMIN 4.0   CBC:  Lab 07/02/12 0625 07/01/12 0432 06/30/12 0430 06/29/12 0445 06/28/12 1920  WBC 6.6 7.3 7.8 9.9 13.1*  NEUTROABS -- -- -- -- 10.4*  HGB 12.1 12.4 12.7 12.1 13.7  HCT 36.9 37.3 38.4 35.7* 40.0  MCV 93.4 92.8 93.4 91.5 91.1  PLT 254 216 228 222 264   : BNP (last 3 results)  Basename 06/28/12 1920  PROBNP 120.7   CBG:  Lab 07/05/12 0748 07/04/12 0556 07/03/12 0742 07/02/12 0808 07/01/12 0854  GLUCAP 99 96 84 107* 111*  Time coordinating discharge: Over 30 minutes  Signed:  Debbora Presto, MD  [facility] 07/05/2012, 12:02 PM Pager [phone]  If 7PM-7AM, please contact night-coverage [email or web address] Password [email or web address]

## 2012-07-06 ENCOUNTER — Telehealth: Payer: Self-pay

## 2012-07-06 MED ORDER — CIPROFLOXACIN HCL 500 MG PO TABS: 500.0000 mg | ORAL_TABLET | Freq: Two times a day (BID) | ORAL | Status: AC

## 2012-07-06 MED ORDER — DOXYCYCLINE HYCLATE 100 MG PO TABS: 50.0000 mg | ORAL_TABLET | Freq: Two times a day (BID) | ORAL | Status: AC

## 2012-07-06 NOTE — Telephone Encounter (Signed)
Patient was at [facility] from 06/28/12 through 07/05/12 and discharged with Levaquin and doxycycline.  The patient cannot afford these 2 medications, please advise on more affordable prescription alternatives.  [employer].

## 2012-07-06 NOTE — Progress Notes (Signed)
Pt called today to see what we could offer for her Rx costs. [name] also has called and reported that the patient was very upset and causing a scene "hollering, Screaming and Crying" about the high RX costs.   I spoke with the patient and called case manager [name]) to see what could be done, however since she is no longer a inpatient she could not offer any assistance. I was told that either way Dilaudid, Zofran , and Xanax would not be prescriptions that were able to be filled. Patient was then told to follow up with her PCP Maria Barre, MD).   Charge nurse notified and help me navigate this patient to proper resources at this point (Her PCP).   MCCLAIN, [name] L 07/06/2012 12:47 PM

## 2012-07-06 NOTE — Progress Notes (Signed)
Pt has had concerns of getting medications, that she may not have enough money to pay for them. I offered case management but she said that she had 100 dollars to spend on her medications and it should "be enough".  She was anxious to go home and was in a rush.  Patient was discharged and family friend picked her up. I encouraged her to try [facility] for meds.

## 2012-07-06 NOTE — Telephone Encounter (Signed)
Ok to change levaquin to cipro ; doxy is already less expensive  Both sent to [facility] pharmacy

## 2012-07-07 ENCOUNTER — Telehealth: Payer: Self-pay

## 2012-07-07 MED ORDER — SULFAMETHOXAZOLE-TRIMETHOPRIM 800-160 MG PO TABS: 1.0000 | ORAL_TABLET | Freq: Two times a day (BID) | ORAL | Status: AC

## 2012-07-07 NOTE — Telephone Encounter (Signed)
Patient informed. 

## 2012-07-07 NOTE — Telephone Encounter (Signed)
[facility] pt pharmacy called back stating that Doxy is $70 and pt is requesting a cheaper alternative. Per JWJ - Septra DS BID x 10 called into Lauren at pharmacy.

## 2012-07-29 ENCOUNTER — Telehealth: Payer: Self-pay | Admitting: Internal Medicine

## 2012-07-29 ENCOUNTER — Telehealth: Payer: Self-pay

## 2012-07-29 MED ORDER — OXYCODONE-ACETAMINOPHEN 5-325 MG PO TABS: 1.0000 | ORAL_TABLET | Freq: Three times a day (TID) | ORAL | Status: DC | PRN

## 2012-07-29 NOTE — Telephone Encounter (Signed)
Patient informed by phone prescription requested is ready for pickup at the front desk.

## 2012-07-29 NOTE — Telephone Encounter (Signed)
Patient picked up rx for oxycodone and was incorrect amount #9.  Please correct.  Incorrect rx on MD's desk.

## 2012-07-29 NOTE — Telephone Encounter (Signed)
The pt called the triage line and is hoping to get a refill of percocet.  Thanks!

## 2012-07-29 NOTE — Telephone Encounter (Signed)
Done hardcopy to robin  

## 2012-08-18 ENCOUNTER — Telehealth: Payer: Self-pay | Admitting: *Deleted

## 2012-08-18 MED ORDER — DICLOFENAC SODIUM 75 MG PO TBEC: 75.0000 mg | DELAYED_RELEASE_TABLET | Freq: Two times a day (BID) | ORAL | Status: DC

## 2012-08-18 NOTE — Telephone Encounter (Signed)
Left msg on triage needing new rx for her diclofenac. Called pt back to verify which pharmacy. Need to go to [facility]..../LMB

## 2012-08-26 ENCOUNTER — Other Ambulatory Visit: Payer: Self-pay

## 2012-08-26 MED ORDER — OXYCODONE-ACETAMINOPHEN 5-325 MG PO TABS: 1.0000 | ORAL_TABLET | Freq: Three times a day (TID) | ORAL | Status: AC | PRN

## 2012-08-26 NOTE — Telephone Encounter (Signed)
Pt notified Rx is ready for pick up.

## 2012-08-26 NOTE — Telephone Encounter (Signed)
Done hardcopy to jessica 

## 2012-09-09 ENCOUNTER — Encounter: Payer: Self-pay | Admitting: Gastroenterology

## 2012-09-20 ENCOUNTER — Ambulatory Visit: Payer: Self-pay | Admitting: Physician Assistant

## 2012-09-20 VITALS — BP 140/72 | HR 77 | Temp 98.6°F | Resp 16 | Ht 66.0 in | Wt 335.0 lb

## 2012-09-20 DIAGNOSIS — R05 Cough: Secondary | ICD-10-CM

## 2012-09-20 DIAGNOSIS — R059 Cough, unspecified: Secondary | ICD-10-CM

## 2012-09-20 DIAGNOSIS — J40 Bronchitis, not specified as acute or chronic: Secondary | ICD-10-CM

## 2012-09-20 MED ORDER — ALBUTEROL SULFATE HFA 108 (90 BASE) MCG/ACT IN AERS: 2.0000 | INHALATION_SPRAY | RESPIRATORY_TRACT | Status: DC | PRN

## 2012-09-20 MED ORDER — AMOXICILLIN 875 MG PO TABS: 875.0000 mg | ORAL_TABLET | Freq: Two times a day (BID) | ORAL | Status: DC

## 2012-09-20 NOTE — Patient Instructions (Signed)
Mucinex (Guafenison) during the day to help bring up mucous Robitussin at night for cough Start amoxicillin today

## 2012-09-20 NOTE — Progress Notes (Signed)
  Subjective:    Patient ID: Maria [name], female    DOB: Feb 18, 1956, 56 y.o.   MRN: [id number]  HPI 56 year old female presents with cough, shortness of breath, and chest tightness.  States her symptoms started in her sinuses as pressure and nasal congestion, 1.5 weeks ago.  Then about 5 days ago her symptoms moved into her chest and she now has cough and chest congestion.  She is a nonsmoker. Does have a history of asthma for which she has used albuterol but that was "years" ago.  Denies wheezing, fever, chills, nausea, vomiting, otalgia, or sinus pain.  Does have a slight sore throat. She has been using OTC alka seltzer which has helped some.      Review of Systems  Constitutional: Negative for fever and chills.  HENT: Positive for sore throat. Negative for congestion, rhinorrhea, neck pain and postnasal drip.   Respiratory: Positive for cough and chest tightness. Negative for wheezing.   Gastrointestinal: Negative for nausea and vomiting.  Neurological: Negative for headaches.  All other systems reviewed and are negative.       Objective:   Physical Exam  Constitutional: She is oriented to person, place, and time. She appears well-developed and well-nourished.  HENT:  Head: Normocephalic and atraumatic.  Right Ear: Tympanic membrane, external ear and ear canal normal.  Left Ear: Hearing, tympanic membrane, external ear and ear canal normal.  Mouth/Throat: Uvula is midline, oropharynx is clear and moist and mucous membranes are normal.  Eyes: Conjunctivae normal are normal.  Neck: Normal range of motion.  Cardiovascular: Normal rate, regular rhythm and normal heart sounds.   Pulmonary/Chest: Effort normal and breath sounds normal.  Lymphadenopathy:    She has no cervical adenopathy.  Neurological: She is alert and oriented to person, place, and time.  Psychiatric: She has a normal mood and affect. Her behavior is normal. Judgment and thought content normal.            Assessment & Plan:   1. Cough   2. Bronchitis   Start amoxicillin 875 mg bid Continue Robitussin prn cough Recommend Mucinex during the day Albuterol prn SOB Follow up if symptoms worsen or fail to improve

## 2012-09-26 ENCOUNTER — Telehealth: Payer: Self-pay | Admitting: Internal Medicine

## 2012-09-26 MED ORDER — OXYCODONE-ACETAMINOPHEN 5-325 MG PO TABS: 1.0000 | ORAL_TABLET | Freq: Four times a day (QID) | ORAL | Status: DC | PRN

## 2012-09-26 NOTE — Telephone Encounter (Signed)
Called the patient informed to pickup hardcopy of prescription at the front desk. 

## 2012-09-26 NOTE — Telephone Encounter (Signed)
rx corrected 

## 2012-09-26 NOTE — Telephone Encounter (Signed)
Caller: [name]/Patient; Patient Name: [name], Maria Mccreedy; PCP: Oliver Barre (Adults only); Best Callback Phone Number: [phone].  Pt calling for a refill on Percocet for her chronic pain symptoms.   Pt will be going out of town on Tuesday. Pt will use [facility] on [address] to refill.  No symtpoms to triage. Pt is available to pick it up today.  Verified script in EPIC.     PLEASE FOLLOW UP WITH PT IN REGARD TO SCRIPT.  Thank you

## 2012-09-26 NOTE — Telephone Encounter (Signed)
Done hardcopy to robin  

## 2012-09-26 NOTE — Telephone Encounter (Signed)
rx already done on oct 1

## 2012-09-26 NOTE — Telephone Encounter (Signed)
Called the patient and she stated she picked up her last percocet and filled on 08/28/12.  Please advise

## 2012-10-11 ENCOUNTER — Telehealth: Payer: Self-pay | Admitting: Internal Medicine

## 2012-10-11 NOTE — Telephone Encounter (Signed)
Caller: Kadience/Patient; Patient Name: Maria [name], Maria [name]; PCP: Oliver Barre (Adults only); Best Callback Phone Number: [phone]. Call regarding Alprazolam 0.5mg  refill.  Last office visit was 09-20-12, next Office visit is 10-24, Pt only has a couple of pills remaining. Pt is asymptomatic.  Pt would like RX sent to  [facility], [phone]. Please follow up w/ Pt.

## 2012-10-11 NOTE — Telephone Encounter (Signed)
Pt req refill for XANAX 0.5 MG. Pt is out of this med, please call pt once its done.

## 2012-10-12 MED ORDER — ALPRAZOLAM 0.5 MG PO TABS: 0.5000 mg | ORAL_TABLET | Freq: Two times a day (BID) | ORAL | Status: DC

## 2012-10-12 NOTE — Telephone Encounter (Signed)
Faxed hardcopy to [facility] [address] and called the patient to inform.

## 2012-10-12 NOTE — Telephone Encounter (Signed)
Done hardcopy to robin  

## 2012-10-13 ENCOUNTER — Ambulatory Visit (INDEPENDENT_AMBULATORY_CARE_PROVIDER_SITE_OTHER): Payer: Self-pay | Admitting: Internal Medicine

## 2012-10-13 ENCOUNTER — Other Ambulatory Visit (INDEPENDENT_AMBULATORY_CARE_PROVIDER_SITE_OTHER): Payer: Self-pay

## 2012-10-13 ENCOUNTER — Encounter: Payer: Self-pay | Admitting: Internal Medicine

## 2012-10-13 VITALS — BP 162/80 | HR 76 | Temp 97.7°F | Ht 66.0 in | Wt 337.4 lb

## 2012-10-13 DIAGNOSIS — R509 Fever, unspecified: Secondary | ICD-10-CM

## 2012-10-13 DIAGNOSIS — Z23 Encounter for immunization: Secondary | ICD-10-CM

## 2012-10-13 DIAGNOSIS — I10 Essential (primary) hypertension: Secondary | ICD-10-CM

## 2012-10-13 DIAGNOSIS — R7302 Impaired glucose tolerance (oral): Secondary | ICD-10-CM

## 2012-10-13 DIAGNOSIS — R7309 Other abnormal glucose: Secondary | ICD-10-CM

## 2012-10-13 LAB — CBC WITH DIFFERENTIAL/PLATELET
Basophils Absolute: 0 10*3/uL (ref 0.0–0.1)
Eosinophils Absolute: 0.2 10*3/uL (ref 0.0–0.7)
HCT: 41.9 % (ref 36.0–46.0)
Hemoglobin: 14.1 g/dL (ref 12.0–15.0)
Lymphs Abs: 4.1 10*3/uL — ABNORMAL HIGH (ref 0.7–4.0)
MCHC: 33.5 g/dL (ref 30.0–36.0)
MCV: 92.5 fl (ref 78.0–100.0)
Monocytes Relative: 9.4 % (ref 3.0–12.0)
Neutro Abs: 4.4 10*3/uL (ref 1.4–7.7)
RDW: 12.7 % (ref 11.5–14.6)

## 2012-10-13 LAB — URINALYSIS, ROUTINE W REFLEX MICROSCOPIC
Hgb urine dipstick: NEGATIVE
Urine Glucose: NEGATIVE
Urobilinogen, UA: 0.2 (ref 0.0–1.0)

## 2012-10-13 NOTE — Patient Instructions (Addendum)
You had the flu shot today Please sign HIPAA form to get records from [facility] Please go to LAB in the Basement for the blood and/or urine tests to be done today You will be contacted by phone if any changes need to be made immediately.  Otherwise, you will receive a letter about your results with an explanation. Please remember to sign up for My Chart at your earliest convenience, as this will be important to you in the future with finding out test results.

## 2012-10-13 NOTE — Assessment & Plan Note (Signed)
?   Viral illness - for f/u cbc, UA today,  to f/u any worsening symptoms or concerns

## 2012-10-13 NOTE — Assessment & Plan Note (Signed)
Mild increased today but overall stable overall by hx and exam, most recent data reviewed with pt, and pt to continue medical treatment as before, pt declines change of tx today BP Readings from Last 3 Encounters:  10/13/12 162/80  09/20/12 140/72  07/05/12 142/64

## 2012-10-13 NOTE — Assessment & Plan Note (Signed)
stable overall by hx and exam, most recent data reviewed with pt, and pt to continue medical treatment as before Lab Results  Component Value Date   HGBA1C  Value: 5.4 (NOTE) The ADA recommends the following therapeutic goal for glycemic control related to Hgb A1c measurement: Goal of therapy: <6.5 Hgb A1c  Reference: American Diabetes Association: Clinical Practice Recommendations 2010, Diabetes Care, 2010, 33: (Suppl  1). 05/30/2009

## 2012-10-13 NOTE — Progress Notes (Signed)
Subjective:    Patient ID: Maria [name], female    DOB: 09-29-1956, 56 y.o.   MRN: [id number]  HPI  Here after being  Seen in [facility] ER oct 20 with fever102, chills, body aches, and per pt has blood in urine, tx with IV antix and toradol in ER, tylenol for fever, and IVF's, given ibuprofen for home but no antibx.  Pt not called since and not sure of urine cx or other results. Had hospn for pyelonephritis July 2013 here. Since oct 20 - no chills but feels "blah", weak, but sleeps ok, more tired.  Denies urinary symptoms such as dysuria, frequency, urgency,or gross hematuria. Great-nephew sick when she was in [place] with ? Glenford Peers.  Pt denies chest pain, increased sob or doe, wheezing, orthopnea, PND, increased LE swelling, palpitations, dizziness or syncope. Not taking HCTZ for several months  - forgot to take with her to [place]  Due for flu shot today.  Pt denies chest pain, increased sob or doe, wheezing, orthopnea, PND, increased LE swelling, palpitations, dizziness or syncope.  Pt denies new neurological symptoms such as new headache, or facial or extremity weakness or numbness  Pt denies polydipsia, polyuria. Past Medical History  Diagnosis Date  . Acute lymphadenitis 08/13/2009  . Acute pharyngitis 02/10/2010  . ALLERGIC RHINITIS 08/10/2007  . ANXIETY 12/06/2007  . BACK PAIN 12/06/2007  . CHEST PAIN 08/13/2009  . DEPRESSION 12/06/2007  . DIVERTICULOSIS, [place] 12/06/2007  . GERD 12/06/2007  . HYPERTENSION 12/06/2007  . LOW BACK PAIN 12/06/2007  . MRSA 02/07/2009  . Pain in joint, multiple sites 04/21/2010  . PARONYCHIA, FINGER 08/13/2009  . RASH-NONVESICULAR 05/09/2008  . SHOULDER PAIN, LEFT 05/09/2008  . SINUSITIS- ACUTE-NOS 05/09/2008  . THORACIC/LUMBOSACRAL NEURITIS/RADICULITIS UNSPEC 12/28/2008  . Lumbar disc disease 03/09/2012  . Cervical disc disease 03/09/2012  . Chronic pain 03/09/2012  . Impaired glucose tolerance 03/13/2012   Past Surgical History  Procedure Date  . Abdominal  hysterectomy   . S/p ovary cyst   . S/p right knee arthroscopy     Dr. Dion Saucier ortho  . Cervical disc surgery     reports that she has quit smoking. She does not have any smokeless tobacco history on file. She reports that she does not drink alcohol or use illicit drugs. family history includes Asthma in her daughter and sister; Cancer in her maternal uncle and other; Diabetes in her mother; Heart disease in her mother; and Hypertension in her mother. Allergies  Allergen Reactions  . Amoxicillin-Pot Clavulanate Nausea And Vomiting  . Codeine Other (See Comments)    hallucinations  . Morphine And Related Other (See Comments)    headaches  . Vicodin (Hydrocodone-Acetaminophen) Other (See Comments)    hallucinations  . Latex Rash   Current Outpatient Prescriptions on File Prior to Visit  Medication Sig Dispense Refill  . albuterol (PROVENTIL HFA;VENTOLIN HFA) 108 (90 BASE) MCG/ACT inhaler Inhale 2 puffs into the lungs every 4 (four) hours as needed for wheezing (cough, shortness of breath or wheezing.).  1 Inhaler  1  . ALPRAZolam (XANAX) 0.5 MG tablet Take 1 tablet (0.5 mg total) by mouth 2 (two) times daily.  60 tablet  0  . diclofenac (VOLTAREN) 75 MG EC tablet Take 1 tablet (75 mg total) by mouth 2 (two) times daily.  60 tablet  3  . hydrochlorothiazide (HYDRODIURIL) 25 MG tablet Take 25 mg by mouth daily.      Marland Kitchen nystatin cream (MYCOSTATIN) Apply to affected area 2 times  daily  30 g  0  . oxyCODONE-acetaminophen (PERCOCET/ROXICET) 5-325 MG per tablet Take 1 tablet by mouth every 6 (six) hours as needed.  90 tablet  0  . potassium chloride (K-DUR,KLOR-CON) 10 MEQ tablet Take 10 mEq by mouth 2 (two) times daily.      Marland Kitchen amoxicillin (AMOXIL) 875 MG tablet Take 1 tablet (875 mg total) by mouth 2 (two) times daily.  20 tablet  0  . metoCLOPramide (REGLAN) 10 MG tablet Take 1 tablet (10 mg total) by mouth 4 (four) times daily -  before meals and at bedtime.  90 tablet  3  . DISCONTD:  DULoxetine (CYMBALTA) 60 MG capsule Take 60 mg by mouth daily.        Marland Kitchen DISCONTD: fexofenadine (ALLEGRA) 180 MG tablet Take 180 mg by mouth daily.        Marland Kitchen DISCONTD: furosemide (LASIX) 40 MG tablet Take 40 mg by mouth daily.          Review of Systems  Constitutional: Negative for diaphoresis and unexpected weight change.  HENT: Negative for tinnitus.   Eyes: Negative for photophobia and visual disturbance.  Respiratory: Negative for choking and stridor.   Gastrointestinal: Negative for vomiting and blood in stool.  Genitourinary: Negative for hematuria and decreased urine volume.  Musculoskeletal: Negative for gait problem.  Skin: Negative for color change and wound.  Neurological: Negative for tremors and numbness.  Psychiatric/Behavioral: Negative for decreased concentration. The patient is not hyperactive.       Objective:   Physical Exam BP 162/80  Pulse 76  Temp 97.7 F (36.5 C) (Oral)  Ht 5\' 6"  (1.676 m)  Wt 337 lb 6 oz (153.032 kg)  BMI 54.45 kg/m2  SpO2 97% Physical Exam  VS noted, not ill appearing Constitutional: Pt appears well-developed and well-nourished.  HENT: Head: Normocephalic.  Right Ear: External ear normal.  Left Ear: External ear normal.  Eyes: Conjunctivae and EOM are normal. Pupils are equal, round, and reactive to light.  Neck: Normal range of motion. Neck supple.  Cardiovascular: Normal rate and regular rhythm.   Pulmonary/Chest: Effort normal and breath sounds normal.  Abd:  Soft, NT, non-distended, + BS Neurological: Pt is alert. Not confused . Motor intact Joint : no effusion/tender Skin: Skin is warm. No erythema./rash  Psychiatric: Pt behavior is normal. Thought content normal.     Assessment & Plan:

## 2012-10-17 ENCOUNTER — Encounter: Payer: Self-pay | Admitting: Internal Medicine

## 2012-10-17 LAB — BASIC METABOLIC PANEL
CO2: 26 mEq/L (ref 19–32)
Calcium: 9.4 mg/dL (ref 8.4–10.5)
Creatinine, Ser: 0.9 mg/dL (ref 0.4–1.2)

## 2012-10-25 ENCOUNTER — Other Ambulatory Visit: Payer: Self-pay

## 2012-10-25 MED ORDER — OXYCODONE-ACETAMINOPHEN 5-325 MG PO TABS: 1.0000 | ORAL_TABLET | Freq: Four times a day (QID) | ORAL | Status: DC | PRN

## 2012-10-25 NOTE — Telephone Encounter (Signed)
Done hardcopy to robin  

## 2012-10-26 NOTE — Telephone Encounter (Signed)
Called the patient informed prescription requested is ready for pickup at the front desk. 

## 2012-11-08 ENCOUNTER — Observation Stay (HOSPITAL_COMMUNITY)
Admission: EM | Admit: 2012-11-08 | Discharge: 2012-11-10 | Disposition: A | Discharge status: Home / Self Care | Payer: Self-pay | Attending: Internal Medicine | Admitting: Internal Medicine

## 2012-11-08 ENCOUNTER — Emergency Department (HOSPITAL_COMMUNITY): Payer: Self-pay

## 2012-11-08 ENCOUNTER — Encounter (HOSPITAL_COMMUNITY): Payer: Self-pay | Admitting: Emergency Medicine

## 2012-11-08 DIAGNOSIS — N179 Acute kidney failure, unspecified: Secondary | ICD-10-CM | POA: Insufficient documentation

## 2012-11-08 DIAGNOSIS — L03119 Cellulitis of unspecified part of limb: Secondary | ICD-10-CM | POA: Insufficient documentation

## 2012-11-08 DIAGNOSIS — D72829 Elevated white blood cell count, unspecified: Secondary | ICD-10-CM

## 2012-11-08 DIAGNOSIS — F411 Generalized anxiety disorder: Secondary | ICD-10-CM

## 2012-11-08 DIAGNOSIS — I10 Essential (primary) hypertension: Secondary | ICD-10-CM

## 2012-11-08 DIAGNOSIS — M519 Unspecified thoracic, thoracolumbar and lumbosacral intervertebral disc disorder: Secondary | ICD-10-CM

## 2012-11-08 DIAGNOSIS — G8929 Other chronic pain: Secondary | ICD-10-CM

## 2012-11-08 DIAGNOSIS — R109 Unspecified abdominal pain: Secondary | ICD-10-CM | POA: Insufficient documentation

## 2012-11-08 DIAGNOSIS — B379 Candidiasis, unspecified: Secondary | ICD-10-CM

## 2012-11-08 DIAGNOSIS — L03116 Cellulitis of left lower limb: Secondary | ICD-10-CM

## 2012-11-08 DIAGNOSIS — K219 Gastro-esophageal reflux disease without esophagitis: Secondary | ICD-10-CM

## 2012-11-08 DIAGNOSIS — E669 Obesity, unspecified: Secondary | ICD-10-CM

## 2012-11-08 DIAGNOSIS — B372 Candidiasis of skin and nail: Secondary | ICD-10-CM | POA: Insufficient documentation

## 2012-11-08 DIAGNOSIS — K7689 Other specified diseases of liver: Secondary | ICD-10-CM | POA: Insufficient documentation

## 2012-11-08 DIAGNOSIS — R509 Fever, unspecified: Principal | ICD-10-CM

## 2012-11-08 DIAGNOSIS — L02419 Cutaneous abscess of limb, unspecified: Secondary | ICD-10-CM | POA: Insufficient documentation

## 2012-11-08 DIAGNOSIS — M545 Low back pain, unspecified: Secondary | ICD-10-CM

## 2012-11-08 DIAGNOSIS — R112 Nausea with vomiting, unspecified: Secondary | ICD-10-CM | POA: Insufficient documentation

## 2012-11-08 HISTORY — DX: Cellulitis, unspecified: L03.90

## 2012-11-08 LAB — CBC WITH DIFFERENTIAL/PLATELET
Basophils Absolute: 0 10*3/uL (ref 0.0–0.1)
Basophils Relative: 0 % (ref 0–1)
Eosinophils Absolute: 0.1 10*3/uL (ref 0.0–0.7)
Eosinophils Relative: 0 % (ref 0–5)
HCT: 41.9 % (ref 36.0–46.0)
MCH: 31.1 pg (ref 26.0–34.0)
MCHC: 33.9 g/dL (ref 30.0–36.0)
MCV: 91.9 fL (ref 78.0–100.0)
Monocytes Absolute: 0.9 10*3/uL (ref 0.1–1.0)
RDW: 12.6 % (ref 11.5–15.5)

## 2012-11-08 LAB — URINALYSIS, ROUTINE W REFLEX MICROSCOPIC
Bilirubin Urine: NEGATIVE
Leukocytes, UA: NEGATIVE
Nitrite: NEGATIVE
Specific Gravity, Urine: 1.022 (ref 1.005–1.030)
Urobilinogen, UA: 0.2 mg/dL (ref 0.0–1.0)
pH: 6 (ref 5.0–8.0)

## 2012-11-08 LAB — HEPATIC FUNCTION PANEL
AST: 24 U/L (ref 0–37)
Albumin: 3.4 g/dL — ABNORMAL LOW (ref 3.5–5.2)
Alkaline Phosphatase: 60 U/L (ref 39–117)
Bilirubin, Direct: 0.2 mg/dL (ref 0.0–0.3)
Total Bilirubin: 0.8 mg/dL (ref 0.3–1.2)

## 2012-11-08 LAB — MRSA PCR SCREENING: MRSA by PCR: NEGATIVE

## 2012-11-08 LAB — BASIC METABOLIC PANEL
Calcium: 9.4 mg/dL (ref 8.4–10.5)
Creatinine, Ser: 0.88 mg/dL (ref 0.50–1.10)
GFR calc Af Amer: 84 mL/min — ABNORMAL LOW (ref >90)

## 2012-11-08 MED ORDER — ACETAMINOPHEN 325 MG PO TABS
650.0000 mg | ORAL_TABLET | Freq: Once | ORAL | Status: AC
  Administered 2012-11-08: 650 mg via ORAL
  Filled 2012-11-08: qty 2

## 2012-11-08 MED ORDER — ONDANSETRON HCL 4 MG PO TABS: 4.0000 mg | ORAL_TABLET | Freq: Four times a day (QID) | ORAL | Status: DC | PRN

## 2012-11-08 MED ORDER — HYDROMORPHONE HCL PF 1 MG/ML IJ SOLN
0.5000 mg | INTRAMUSCULAR | Status: DC | PRN
  Administered 2012-11-08 – 2012-11-09 (×4): 0.5 mg via INTRAVENOUS
  Filled 2012-11-08 (×4): qty 1

## 2012-11-08 MED ORDER — ALUM & MAG HYDROXIDE-SIMETH 200-200-20 MG/5ML PO SUSP: 30.0000 mL | Freq: Four times a day (QID) | ORAL | Status: DC | PRN

## 2012-11-08 MED ORDER — OXYCODONE-ACETAMINOPHEN 5-325 MG PO TABS
1.0000 | ORAL_TABLET | Freq: Four times a day (QID) | ORAL | Status: DC | PRN
  Administered 2012-11-08 – 2012-11-09 (×3): 1 via ORAL
  Filled 2012-11-08 (×3): qty 1

## 2012-11-08 MED ORDER — SODIUM CHLORIDE 0.9 % IV BOLUS (SEPSIS)
500.0000 mL | Freq: Once | INTRAVENOUS | Status: AC
  Administered 2012-11-08: 500 mL via INTRAVENOUS

## 2012-11-08 MED ORDER — SODIUM CHLORIDE 0.9 % IV SOLN: 250.0000 mL | INTRAVENOUS | Status: DC | PRN

## 2012-11-08 MED ORDER — HYDROCHLOROTHIAZIDE 25 MG PO TABS
25.0000 mg | ORAL_TABLET | Freq: Every day | ORAL | Status: DC
  Filled 2012-11-08: qty 1

## 2012-11-08 MED ORDER — ALBUTEROL SULFATE HFA 108 (90 BASE) MCG/ACT IN AERS: 2.0000 | INHALATION_SPRAY | RESPIRATORY_TRACT | Status: DC | PRN

## 2012-11-08 MED ORDER — SODIUM CHLORIDE 0.9 % IJ SOLN: 3.0000 mL | INTRAMUSCULAR | Status: DC | PRN

## 2012-11-08 MED ORDER — FENTANYL CITRATE 0.05 MG/ML IJ SOLN
50.0000 ug | Freq: Once | INTRAMUSCULAR | Status: AC
  Administered 2012-11-08: 50 ug via INTRAVENOUS
  Filled 2012-11-08: qty 2

## 2012-11-08 MED ORDER — POTASSIUM CHLORIDE CRYS ER 10 MEQ PO TBCR
10.0000 meq | EXTENDED_RELEASE_TABLET | Freq: Two times a day (BID) | ORAL | Status: DC
  Administered 2012-11-08 – 2012-11-10 (×4): 10 meq via ORAL
  Filled 2012-11-08 (×5): qty 1

## 2012-11-08 MED ORDER — SODIUM CHLORIDE 0.9 % IJ SOLN
3.0000 mL | Freq: Two times a day (BID) | INTRAMUSCULAR | Status: DC
  Administered 2012-11-08 – 2012-11-09 (×3): 3 mL via INTRAVENOUS

## 2012-11-08 MED ORDER — ONDANSETRON HCL 4 MG/2ML IJ SOLN
4.0000 mg | Freq: Once | INTRAMUSCULAR | Status: AC
  Administered 2012-11-08: 4 mg via INTRAVENOUS
  Filled 2012-11-08: qty 2

## 2012-11-08 MED ORDER — ALPRAZOLAM 0.5 MG PO TABS
0.5000 mg | ORAL_TABLET | Freq: Two times a day (BID) | ORAL | Status: DC
  Administered 2012-11-08 – 2012-11-10 (×4): 0.5 mg via ORAL
  Filled 2012-11-08 (×4): qty 1

## 2012-11-08 MED ORDER — ACETAMINOPHEN 650 MG RE SUPP: 650.0000 mg | Freq: Four times a day (QID) | RECTAL | Status: DC | PRN

## 2012-11-08 MED ORDER — ONDANSETRON HCL 4 MG/2ML IJ SOLN: 4.0000 mg | Freq: Four times a day (QID) | INTRAMUSCULAR | Status: DC | PRN

## 2012-11-08 MED ORDER — HYDROMORPHONE HCL PF 1 MG/ML IJ SOLN
0.5000 mg | Freq: Once | INTRAMUSCULAR | Status: AC
  Administered 2012-11-08: 0.5 mg via INTRAVENOUS
  Filled 2012-11-08: qty 1

## 2012-11-08 MED ORDER — ACETAMINOPHEN 325 MG PO TABS
650.0000 mg | ORAL_TABLET | Freq: Four times a day (QID) | ORAL | Status: DC | PRN
  Administered 2012-11-08 – 2012-11-09 (×2): 650 mg via ORAL
  Filled 2012-11-08 (×2): qty 2

## 2012-11-08 NOTE — ED Notes (Signed)
Call to 5E for charge

## 2012-11-08 NOTE — ED Notes (Signed)
Attempt to place IVx 2 without success.  Second RN notified to attempt IV placement.  Will medicate pt once IV is in place.

## 2012-11-08 NOTE — ED Notes (Signed)
Report given to 5E 

## 2012-11-08 NOTE — ED Notes (Signed)
Bed:WA19<BR> Expected date:<BR> Expected time:<BR> Means of arrival:<BR> Comments:<BR> Triage 1 

## 2012-11-08 NOTE — Progress Notes (Signed)
[facility] ED CM noted CM consult from admission RN for assistance with DME. Cm spoke with Maria [name] who would like to have a shower tub bench Maria [name] reports inability to stay in shower long because of unable to stand long.  Maria [name] and female family member at bedside reports having a cane and a walker already Maria [name] choose [facility] to provide DME. CM sent [facility] staff request for assistance and request Maria [name] to be followed while in [facility]. Cm left Maria [name] with written [facility] contact information  HOME HEALTH AGENCIES SERVING [place]   Agencies that are Medicare-Certified and are affiliated with [facility] Agency  Telephone Number Address  [facility]   The [facility] has ownership interest in this company; however, you are under no obligation to use this agency. [phone] or  [phone] [address] [place], [place] [address] [email or web address]   Agencies that are Medicare-Certified and are not affiliated with [facility] Telephone Number Address  [facility] [phone] Fax [phone] [address] [place], [place]  [address] [email or web address]  [facility] [phone] or [phone] Fax [phone] [address] [place], [place] [address] [email or web address]  [facility] [phone] Fax [phone] [address] [place], [place] [address] [email or web address]  [facility] [phone] Fax [phone] [address] [place], [place]  [address] [email or web address]  [facility] [phone] Fax [phone] [address], Suite [place], [place] [address] [email or web address]  [facility] [phone] [address] [place], [place] [address] [email or web address]  [facility] [phone]  [address] [place], [place] [address] [email or web address]  [facility] [phone] or [phone] Fax number [phone] [address] [place], [place]  [address] [email or web address]  [facility] [phone] Fax [phone] [address] [place], [place]  [address]  [facility]  [phone] Fax [phone] [address] [place], [place] [address] [email or web address]

## 2012-11-08 NOTE — ED Notes (Signed)
Pt was taken to radiology.

## 2012-11-08 NOTE — H&P (Signed)
History and Physical  Maria [name] ZOX:[id number] DOB: 1956-12-19 DOA: 11/08/2012  Referring physician: Rolan Bucco, MD PCP: Oliver Barre, MD   Chief Complaint: fever  HPI:  56 year old woman presented to ED with history of fever, whole body myalgias and increased low back pain.  Patient was in usual state of health yesterday. Last night she suddenly developed high fever, chills, rigors and myalgias. No cough, congestion, ST or rhinorrhea. No dysuria. No rash except under breasts and pannus/groin. Only focal complaint is increased low back pain (superimposed on long-standing chronic pain MRI lumbar spine 12/2008--moderate spinal stenosis). No strain or injury.  Reports approximately 1 month ago went to ED in [place] for high fever, similar symptoms, was discharged from ED and the next day was fine. Chart review notable for history of recurrent LE cellulitis--hospitalized 2006, twice in 05/2011 (second time discharged on doripenem). Hospitalized 06/2012 for presumed pyelonephritis. Culture data unrevealing.  In ED today temperature 103.3, HR 100s, SBP maintained WNL, no hypoxia or tachypnea. WBC 17.0. CMP unremarkable, urinalysis negative, CXR negative.  Review of Systems:  Negative for visual changes, sore throat, chest pain, SOB, cough, dysuria, bleeding, n/v/abdominal pain, vaginal discharge, bowel/bladder incontinence, sacral dysesthesia, numbness/tingling of lower extremities, groin pain.   Past Medical History  Diagnosis Date  . Acute lymphadenitis 08/13/2009  . Acute pharyngitis 02/10/2010  . ALLERGIC RHINITIS 08/10/2007  . ANXIETY 12/06/2007  . BACK PAIN 12/06/2007  . CHEST PAIN 08/13/2009  . DEPRESSION 12/06/2007  . DIVERTICULOSIS, [place] 12/06/2007  . GERD 12/06/2007  . HYPERTENSION 12/06/2007  . LOW BACK PAIN 12/06/2007  . MRSA 02/07/2009  . Pain in joint, multiple sites 04/21/2010  . PARONYCHIA, FINGER 08/13/2009  . RASH-NONVESICULAR 05/09/2008  . SHOULDER PAIN, LEFT 05/09/2008    . SINUSITIS- ACUTE-NOS 05/09/2008  . THORACIC/LUMBOSACRAL NEURITIS/RADICULITIS UNSPEC 12/28/2008  . Lumbar disc disease 03/09/2012  . Cervical disc disease 03/09/2012  . Chronic pain 03/09/2012  . Impaired glucose tolerance 03/13/2012  . Cellulitis     Past Surgical History  Procedure Date  . Abdominal hysterectomy   . S/p ovary cyst   . S/p right knee arthroscopy     Dr. Dion Saucier ortho  . Cervical disc surgery     Social History:  reports that she quit smoking about 4 years ago. She has never used smokeless tobacco. She reports that she does not drink alcohol or use illicit drugs.  Allergies  Allergen Reactions  . Amoxicillin-Pot Clavulanate Nausea And Vomiting  . Codeine Other (See Comments)    hallucinations  . Morphine And Related Other (See Comments)    headaches  . Vicodin (Hydrocodone-Acetaminophen) Other (See Comments)    hallucinations  . Latex Rash    Family History  Problem Relation Age of Onset  . Heart disease Mother   . Hypertension Mother   . Diabetes Mother   . Asthma Sister   . Asthma Daughter   . Cancer Maternal Uncle     [place]  . Cancer Other     ovarian     Prior to Admission medications   Medication Sig Start Date End Date Taking? Authorizing Provider  albuterol (PROVENTIL HFA;VENTOLIN HFA) 108 (90 BASE) MCG/ACT inhaler Inhale 2 puffs into the lungs every 4 (four) hours as needed for wheezing (cough, shortness of breath or wheezing.). 09/20/12  Yes Heather M Marte, PA-C  ALPRAZolam Prudy Feeler) 0.5 MG tablet Take 1 tablet (0.5 mg total) by mouth 2 (two) times daily. 10/12/12  Yes Corwin Levins, MD  diclofenac (VOLTAREN) 75  MG EC tablet Take 1 tablet (75 mg total) by mouth 2 (two) times daily. 08/18/12  Yes Corwin Levins, MD  hydrochlorothiazide (HYDRODIURIL) 25 MG tablet Take 25 mg by mouth daily.   Yes Historical Provider, MD  oxyCODONE-acetaminophen (PERCOCET/ROXICET) 5-325 MG per tablet Take 1 tablet by mouth every 6 (six) hours as needed. 10/25/12  Yes  Corwin Levins, MD  potassium chloride (K-DUR,KLOR-CON) 10 MEQ tablet Take 10 mEq by mouth 2 (two) times daily.   Yes Historical Provider, MD   Physical Exam: Filed Vitals:   11/08/12 1513 11/08/12 1635 11/08/12 1636 11/08/12 1806  BP: 137/66 134/56  119/48  Pulse: 109 104  102  Temp: 103.3 F (39.6 C)  102.5 F (39.2 C) 100.3 F (37.9 C)  TempSrc: Oral  Oral Oral  Resp: 22   20  SpO2: 92% 96%  98%   General:  Examined in ED with RN Alvino Chapel as chaperone. Appears calm, mildly uncomfortable. Non-toxic. Eyes: PERRL, normal lids, irises  ENT: grossly normal hearing, lips & tongue Neck: no LAD, masses or thyromegaly Cardiovascular: tachycardic, regular rhythm, no m/r/g. No LE edema. Respiratory: CTA bilaterally, no w/r/r. Normal respiratory effort. Abdomen: soft, ntnd Skin: back & buttocks appear normal. Breasts notable for mild erythema at folds. Mild erythema under pannus with small  (4x4 cm) oval shaped more intense erythema left lateral pannus, non-tender. Groin red with satellite lesions. No fluctuance noted. BLE without erythema, edema or pain. Musculoskeletal: grossly normal tone BUE/BLE, moves arms and legs well. Psychiatric: grossly normal mood and affect, speech fluent and appropriate Neurologic: grossly non-focal.  Wt Readings from Last 3 Encounters:  10/13/12 153.032 kg (337 lb 6 oz)  09/20/12 151.955 kg (335 lb)  07/04/12 154.223 kg (340 lb)    Labs on Admission:  Basic Metabolic Panel:  Lab 11/08/12 1610  NA 140  K 3.8  CL 100  CO2 27  GLUCOSE 112*  BUN 16  CREATININE 0.88  CALCIUM 9.4  MG --  PHOS --   CBC:  Lab 11/08/12 1400  WBC 17.0*  NEUTROABS 14.7*  HGB 14.2  HCT 41.9  MCV 91.9  PLT 255   Radiological Exams on Admission: Dg Chest 2 View  11/08/2012  *RADIOLOGY REPORT*  Clinical Data: Cough, fever, chills  CHEST - 2 VIEW  Comparison: Chest x-ray of 06/28/2012  Findings: No pneumonia or effusion is seen.  Mediastinal contours are stable.  The  heart is mildly enlarged and stable.  A lower anterior cervical spine fusion plate is present.  No acute bony abnormality is seen.  IMPRESSION: No active lung disease.  Stable mild cardiomegaly.   Original Report Authenticated By: Dwyane Dee, M.D.     Principal Problem:  *Fever Active Problems:  ANXIETY  LOW BACK PAIN  Leukocytosis  Candidiasis   Assessment/Plan 1. Fever with leukocytosis--etiology unclear, urinalysis/CXR and skin exam unrevealing. Only localizing symptom is low back pain. Question significance of similar symptoms quickly resolved in [place] one month ago. Check lactic acid, LFTs, Flu PCR. Appears non-toxic. Given clinical stability, history and lack of source will monitor without antibiotics. Antipyretics, pain control. 2. Acute/chronic low back pain--significance unclear, long-standing chronic back pain and known spinal stenosis. No neurologic signs or symptoms to suggest compression or infection, but if condition fails to improve could consider MRI. 3. Intertriginal candidiasis--nystatin.   4. Anxiety--continue Xanax.  Code Status: Full code Family Communication: discussed with daughter at bedside Disposition Plan/Anticipated LOS: 2-3 days  Time spent: 65 minutes  Brendia Sacks,  MD  [facility] Team 6 Pager [phone]. If 8PM-8AM, please contact night-coverage at [email or web address], password [email or web address] 11/08/2012, 6:17 PM

## 2012-11-08 NOTE — ED Notes (Signed)
Paged admitting MD for orders.  He notified me that he will be down to see pt very shortly.  Notified pt of this

## 2012-11-08 NOTE — ED Notes (Signed)
Call to 5E to give report

## 2012-11-08 NOTE — ED Provider Notes (Signed)
History     CSN: [id number]  Arrival date & time 11/08/12  1308   First MD Initiated Contact with Patient 11/08/12 1344      Chief Complaint  Patient presents with  . Cellulitis  . Fever  . Chills    (Consider location/radiation/quality/duration/timing/severity/associated sxs/prior treatment) HPI Comments: Patient presents with a one-day history of fever chills and body aches. She states that she was admitted twice before this year for similar pain but the etiology of the infection was not discovered. She states that she feels fatigued she's had some nausea and vomiting associated with it. She denies any cough or chest congestion. She denies any UTI symptoms. She denies any rashes other than some yeast infection under her breast and under her abdominal wall. She denies any significant headache or neck stiffness. She states her symptoms have been worsening throughout today and she hurts all over.  Patient is a 56 y.o. female presenting with fever.  Fever Primary symptoms of the febrile illness include fever, myalgias and rash. Primary symptoms do not include fatigue, headaches, cough, shortness of breath, abdominal pain, nausea, vomiting, diarrhea or arthralgias.  The myalgias are not associated with weakness.    Past Medical History  Diagnosis Date  . Acute lymphadenitis 08/13/2009  . Acute pharyngitis 02/10/2010  . ALLERGIC RHINITIS 08/10/2007  . ANXIETY 12/06/2007  . BACK PAIN 12/06/2007  . CHEST PAIN 08/13/2009  . DEPRESSION 12/06/2007  . DIVERTICULOSIS, [place] 12/06/2007  . GERD 12/06/2007  . HYPERTENSION 12/06/2007  . LOW BACK PAIN 12/06/2007  . MRSA 02/07/2009  . Pain in joint, multiple sites 04/21/2010  . PARONYCHIA, FINGER 08/13/2009  . RASH-NONVESICULAR 05/09/2008  . SHOULDER PAIN, LEFT 05/09/2008  . SINUSITIS- ACUTE-NOS 05/09/2008  . THORACIC/LUMBOSACRAL NEURITIS/RADICULITIS UNSPEC 12/28/2008  . Lumbar disc disease 03/09/2012  . Cervical disc disease 03/09/2012  . Chronic  pain 03/09/2012  . Impaired glucose tolerance 03/13/2012  . Cellulitis     Past Surgical History  Procedure Date  . Abdominal hysterectomy   . S/p ovary cyst   . S/p right knee arthroscopy     Dr. Dion Saucier ortho  . Cervical disc surgery     Family History  Problem Relation Age of Onset  . Heart disease Mother   . Hypertension Mother   . Diabetes Mother   . Asthma Sister   . Asthma Daughter   . Cancer Maternal Uncle     [place]  . Cancer Other     ovarian    History  Substance Use Topics  . Smoking status: Former Smoker -- 30 years    Quit date: 11/08/2008  . Smokeless tobacco: Never Used     Comment: quit 10/09  . Alcohol Use: No    OB History    Grav Para Term Preterm Abortions TAB SAB Ect Mult Living                  Review of Systems  Constitutional: Positive for fever. Negative for chills, diaphoresis and fatigue.  HENT: Negative for congestion, rhinorrhea and sneezing.   Eyes: Negative.   Respiratory: Negative for cough, chest tightness and shortness of breath.   Cardiovascular: Negative for chest pain and leg swelling.  Gastrointestinal: Negative for nausea, vomiting, abdominal pain, diarrhea and blood in stool.  Genitourinary: Negative for frequency, hematuria, flank pain and difficulty urinating.  Musculoskeletal: Positive for myalgias. Negative for back pain and arthralgias.  Skin: Positive for rash.  Neurological: Negative for dizziness, speech difficulty, weakness, numbness and headaches.  Allergies  Amoxicillin-pot clavulanate; Codeine; Morphine and related; Vicodin; and Latex  Home Medications   No current outpatient prescriptions on file.  BP 145/69  Pulse 80  Temp 98.9 F (37.2 C) (Oral)  Resp 18  Ht 5\' 6"  (1.676 m)  Wt 339 lb 12.8 oz (154.132 kg)  BMI 54.85 kg/m2  SpO2 94%  Physical Exam  Constitutional: She is oriented to person, place, and time. She appears well-developed and well-nourished.  HENT:  Head: Normocephalic and  atraumatic.  Mouth/Throat: Oropharynx is clear and moist. No oropharyngeal exudate.  Eyes: Conjunctivae normal are normal. Pupils are equal, round, and reactive to light.  Neck: Normal range of motion. Neck supple.  Cardiovascular: Normal rate, regular rhythm and normal heart sounds.   Pulmonary/Chest: Effort normal and breath sounds normal. No respiratory distress. She has no wheezes. She has no rales. She exhibits no tenderness.  Abdominal: Soft. Bowel sounds are normal. There is no tenderness. There is no rebound and no guarding.  Musculoskeletal: Normal range of motion. She exhibits no edema.  Lymphadenopathy:    She has no cervical adenopathy.  Neurological: She is alert and oriented to person, place, and time.  Skin: Skin is warm and dry. No rash noted.       Small area of candidal type infection under her both of her breast and under her large abdominal pain is on the left. There is no evidence of overlying bacterial infection.  Psychiatric: She has a normal mood and affect.    ED Course  Procedures (including critical care time)  Results for orders placed during the hospital encounter of 11/08/12  CBC WITH DIFFERENTIAL      Component Value Range   WBC 17.0 (*) 4.0 - 10.5 K/uL   RBC 4.56  3.87 - 5.11 MIL/uL   Hemoglobin 14.2  12.0 - 15.0 g/dL   HCT 16.1  09.6 - 04.5 %   MCV 91.9  78.0 - 100.0 fL   MCH 31.1  26.0 - 34.0 pg   MCHC 33.9  30.0 - 36.0 g/dL   RDW 40.9  81.1 - 91.4 %   Platelets 255  150 - 400 K/uL   Neutrophils Relative 86 (*) 43 - 77 %   Neutro Abs 14.7 (*) 1.7 - 7.7 K/uL   Lymphocytes Relative 8 (*) 12 - 46 %   Lymphs Abs 1.4  0.7 - 4.0 K/uL   Monocytes Relative 5  3 - 12 %   Monocytes Absolute 0.9  0.1 - 1.0 K/uL   Eosinophils Relative 0  0 - 5 %   Eosinophils Absolute 0.1  0.0 - 0.7 K/uL   Basophils Relative 0  0 - 1 %   Basophils Absolute 0.0  0.0 - 0.1 K/uL  BASIC METABOLIC PANEL      Component Value Range   Sodium 140  135 - 145 mEq/L   Potassium  3.8  3.5 - 5.1 mEq/L   Chloride 100  96 - 112 mEq/L   CO2 27  19 - 32 mEq/L   Glucose, Bld 112 (*) 70 - 99 mg/dL   BUN 16  6 - 23 mg/dL   Creatinine, Ser 7.82  0.50 - 1.10 mg/dL   Calcium 9.4  8.4 - 95.6 mg/dL   GFR calc non Af Amer 73 (*) >90 mL/min   GFR calc Af Amer 84 (*) >90 mL/min  URINALYSIS, ROUTINE W REFLEX MICROSCOPIC      Component Value Range   Color, Urine YELLOW  YELLOW  APPearance CLOUDY (*) CLEAR   Specific Gravity, Urine 1.022  1.005 - 1.030   pH 6.0  5.0 - 8.0   Glucose, UA NEGATIVE  NEGATIVE mg/dL   Hgb urine dipstick NEGATIVE  NEGATIVE   Bilirubin Urine NEGATIVE  NEGATIVE   Ketones, ur NEGATIVE  NEGATIVE mg/dL   Protein, ur NEGATIVE  NEGATIVE mg/dL   Urobilinogen, UA 0.2  0.0 - 1.0 mg/dL   Nitrite NEGATIVE  NEGATIVE   Leukocytes, UA NEGATIVE  NEGATIVE  BASIC METABOLIC PANEL      Component Value Range   Sodium 136  135 - 145 mEq/L   Potassium 3.6  3.5 - 5.1 mEq/L   Chloride 98  96 - 112 mEq/L   CO2 29  19 - 32 mEq/L   Glucose, Bld 111 (*) 70 - 99 mg/dL   BUN 22  6 - 23 mg/dL   Creatinine, Ser 1.61 (*) 0.50 - 1.10 mg/dL   Calcium 8.8  8.4 - 09.6 mg/dL   GFR calc non Af Amer 48 (*) >90 mL/min   GFR calc Af Amer 55 (*) >90 mL/min  CBC      Component Value Range   WBC 15.9 (*) 4.0 - 10.5 K/uL   RBC 4.05  3.87 - 5.11 MIL/uL   Hemoglobin 12.8  12.0 - 15.0 g/dL   HCT 04.5  40.9 - 81.1 %   MCV 92.6  78.0 - 100.0 fL   MCH 31.6  26.0 - 34.0 pg   MCHC 34.1  30.0 - 36.0 g/dL   RDW 91.4  78.2 - 95.6 %   Platelets 218  150 - 400 K/uL  LACTIC ACID, PLASMA      Component Value Range   Lactic Acid, Venous 1.6  0.5 - 2.2 mmol/L  HEPATIC FUNCTION PANEL      Component Value Range   Total Protein 6.4  6.0 - 8.3 g/dL   Albumin 3.4 (*) 3.5 - 5.2 g/dL   AST 24  0 - 37 U/L   ALT 35  0 - 35 U/L   Alkaline Phosphatase 60  39 - 117 U/L   Total Bilirubin 0.8  0.3 - 1.2 mg/dL   Bilirubin, Direct 0.2  0.0 - 0.3 mg/dL   Indirect Bilirubin 0.6  0.3 - 0.9 mg/dL  INFLUENZA  PANEL BY PCR      Component Value Range   Influenza A By PCR NEGATIVE  NEGATIVE   Influenza B By PCR NEGATIVE  NEGATIVE   H1N1 flu by pcr NOT DETECTED  NOT DETECTED  MRSA PCR SCREENING      Component Value Range   MRSA by PCR NEGATIVE  NEGATIVE   Dg Chest 2 View  11/08/2012  *RADIOLOGY REPORT*  Clinical Data: Cough, fever, chills  CHEST - 2 VIEW  Comparison: Chest x-ray of 06/28/2012  Findings: No pneumonia or effusion is seen.  Mediastinal contours are stable.  The heart is mildly enlarged and stable.  A lower anterior cervical spine fusion plate is present.  No acute bony abnormality is seen.  IMPRESSION: No active lung disease.  Stable mild cardiomegaly.   Original Report Authenticated By: Dwyane Dee, M.D.      1. Febrile illness   2. Lumbago   3. Candidiasis   4. Anxiety state, unspecified   5. Chronic pain   6. Leukocytosis       MDM  Consulted hospitalist who will admit pt        Rolan Bucco, MD 11/09/12 1820

## 2012-11-08 NOTE — ED Notes (Signed)
Checked with MD to see if he wants to order anything else for her current fever, no new orders

## 2012-11-08 NOTE — ED Notes (Signed)
Await call back from RN on floor to take report.  POC explained to pt

## 2012-11-08 NOTE — ED Notes (Addendum)
Pt has no orders.  Will check with MD

## 2012-11-08 NOTE — ED Notes (Signed)
Pt started having fever, chills, and body aches today.  Thinks they may be related to cellulitis that she has under her breast.  Also c/o NV.

## 2012-11-08 NOTE — ED Notes (Signed)
Paged admitting MD for orders as pt continues to have pain and fever and needs admitting orders to go to her room.

## 2012-11-08 NOTE — ED Notes (Signed)
Call to 5E for report

## 2012-11-08 NOTE — ED Notes (Signed)
Charge RN is in room for IV start

## 2012-11-08 NOTE — ED Notes (Signed)
Call to 5E 

## 2012-11-09 ENCOUNTER — Observation Stay (HOSPITAL_COMMUNITY): Payer: Self-pay

## 2012-11-09 ENCOUNTER — Other Ambulatory Visit (HOSPITAL_COMMUNITY): Payer: Self-pay

## 2012-11-09 DIAGNOSIS — D72829 Elevated white blood cell count, unspecified: Secondary | ICD-10-CM

## 2012-11-09 LAB — BASIC METABOLIC PANEL
CO2: 29 mEq/L (ref 19–32)
Chloride: 98 mEq/L (ref 96–112)
Creatinine, Ser: 1.25 mg/dL — ABNORMAL HIGH (ref 0.50–1.10)
Glucose, Bld: 111 mg/dL — ABNORMAL HIGH (ref 70–99)

## 2012-11-09 LAB — CBC
HCT: 37.5 % (ref 36.0–46.0)
MCH: 31.6 pg (ref 26.0–34.0)
MCV: 92.6 fL (ref 78.0–100.0)
RDW: 13 % (ref 11.5–15.5)
WBC: 15.9 10*3/uL — ABNORMAL HIGH (ref 4.0–10.5)

## 2012-11-09 LAB — INFLUENZA PANEL BY PCR (TYPE A & B): Influenza A By PCR: NEGATIVE

## 2012-11-09 MED ORDER — LEVOFLOXACIN IN D5W 500 MG/100ML IV SOLN
500.0000 mg | INTRAVENOUS | Status: DC
Start: 2012-11-09 — End: 2012-11-10
  Administered 2012-11-10: 500 mg via INTRAVENOUS
  Filled 2012-11-09 (×2): qty 100

## 2012-11-09 MED ORDER — PANTOPRAZOLE SODIUM 40 MG PO TBEC
40.0000 mg | DELAYED_RELEASE_TABLET | Freq: Every day | ORAL | Status: DC
  Administered 2012-11-10: 40 mg via ORAL
  Filled 2012-11-09: qty 1

## 2012-11-09 MED ORDER — SODIUM CHLORIDE 0.9 % IV SOLN: 250.0000 mL | INTRAVENOUS | Status: DC | PRN

## 2012-11-09 MED ORDER — HYDROMORPHONE HCL PF 1 MG/ML IJ SOLN
1.0000 mg | INTRAMUSCULAR | Status: DC | PRN
  Administered 2012-11-09 – 2012-11-10 (×7): 1 mg via INTRAVENOUS
  Filled 2012-11-09 (×7): qty 1

## 2012-11-09 MED ORDER — SODIUM CHLORIDE 0.9 % IV SOLN
INTRAVENOUS | Status: AC
Start: 2012-11-09 — End: 2012-11-10

## 2012-11-09 NOTE — Progress Notes (Signed)
INITIAL ADULT NUTRITION ASSESSMENT Date: 11/09/2012   Time: 1:19 PM Reason for Assessment: Nutrition risk   INTERVENTION: Encouraged increased meal intake. Pt may benefit from GI consult r/t 1-2 month history of diarrhea after consuming certain vegetables/dairy products. Will monitor.    ASSESSMENT: Female 56 y.o.  Dx: Fever  Food/Nutrition Related Hx: Pt reports decreased meal intake of 2 meals/day for the past 2 weeks r/t poor appetite. Pt reports before then she was eating 3 small meals/day. Pt avoids adding salt to her food and stopped drinking soda years ago. Pt reports for the past 2 months pt has had diarrhea immediately after eating certain foods like onions, mushrooms, pizza, and alfredo sauces. Pt reports she thinks she is lactose intolerance because milk and processed cheeses give her diarrhea. Pt reports 14 pound unintended weight loss in the past 2 months from diarrhea. Pt denies any diarrhea today and reports consuming 50% of breakfast which consisted of a boiled egg and half of a bagel. Pt became emotional during visit stated this is the 4th admission this year for fever, chills, and body aches and states "no one can tell me why this keeps happening". Pt is worried that she will die in her 56s like her mother did. Provided emotional support.   Hx:  Past Medical History  Diagnosis Date  . Acute lymphadenitis 08/13/2009  . Acute pharyngitis 02/10/2010  . ALLERGIC RHINITIS 08/10/2007  . ANXIETY 12/06/2007  . BACK PAIN 12/06/2007  . CHEST PAIN 08/13/2009  . DEPRESSION 12/06/2007  . DIVERTICULOSIS, [place] 12/06/2007  . GERD 12/06/2007  . HYPERTENSION 12/06/2007  . LOW BACK PAIN 12/06/2007  . MRSA 02/07/2009  . Pain in joint, multiple sites 04/21/2010  . PARONYCHIA, FINGER 08/13/2009  . RASH-NONVESICULAR 05/09/2008  . SHOULDER PAIN, LEFT 05/09/2008  . SINUSITIS- ACUTE-NOS 05/09/2008  . THORACIC/LUMBOSACRAL NEURITIS/RADICULITIS UNSPEC 12/28/2008  . Lumbar disc disease 03/09/2012  .  Cervical disc disease 03/09/2012  . Chronic pain 03/09/2012  . Impaired glucose tolerance 03/13/2012  . Cellulitis    Related Meds:  Scheduled Meds:   . [COMPLETED] acetaminophen  650 mg Oral Once  . ALPRAZolam  0.5 mg Oral BID  . [COMPLETED] fentaNYL  50 mcg Intravenous Once  . [COMPLETED]  HYDROmorphone (DILAUDID) injection  0.5 mg Intravenous Once  . [COMPLETED] ondansetron  4 mg Intravenous Once  . potassium chloride  10 mEq Oral BID  . [COMPLETED] sodium chloride  500 mL Intravenous Once  . sodium chloride  3 mL Intravenous Q12H  . [DISCONTINUED] hydrochlorothiazide  25 mg Oral Daily   Continuous Infusions:  PRN Meds:.sodium chloride, acetaminophen, acetaminophen, albuterol, alum & mag hydroxide-simeth, HYDROmorphone (DILAUDID) injection, ondansetron (ZOFRAN) IV, ondansetron, oxyCODONE-acetaminophen, sodium chloride, [DISCONTINUED] sodium chloride  Ht: 5\' 6"  (167.6 cm)  Wt: 339 lb 12.8 oz (154.132 kg)  Ideal Wt: 130 lb % Ideal Wt: 260  Usual Wt: 353 lb % Usual Wt: 96  Body mass index is 54.85 kg/(m^2). Class III extreme obesity   Labs:  CMP     Component Value Date/Time   NA 136 11/09/2012 0633   K 3.6 11/09/2012 0633   CL 98 11/09/2012 0633   CO2 29 11/09/2012 0633   GLUCOSE 111* 11/09/2012 0633   GLUCOSE 107* 12/31/2006 0847   BUN 22 11/09/2012 0633   CREATININE 1.25* 11/09/2012 0633   CALCIUM 8.8 11/09/2012 0633   PROT 6.4 11/08/2012 2018   ALBUMIN 3.4* 11/08/2012 2018   AST 24 11/08/2012 2018   ALT 35 11/08/2012 2018  ALKPHOS 60 11/08/2012 2018   BILITOT 0.8 11/08/2012 2018   GFRNONAA 48* 11/09/2012 0633   GFRAA 55* 11/09/2012 0960    Intake/Output Summary (Last 24 hours) at 11/09/12 1327 Last data filed at 11/09/12 0500  Gross per 24 hour  Intake    720 ml  Output      2 ml  Net    718 ml   Last BM - 11/18  Diet Order: General   IVF:    Estimated Nutritional Needs:   Kcal:1550-1800 Protein:70-85g Fluid:1.5-1.8L  NUTRITION  DIAGNOSIS: -Predicted suboptimal energy intake (NI-1.6).  Status: Ongoing  RELATED TO: poor appetite PTA  AS EVIDENCE BY: pt statement  MONITORING/EVALUATION(Goals): Pt to consume >75% of meals.   EDUCATION NEEDS: -No education needs identified at this time   Dietitian #: [phone]  DOCUMENTATION CODES Per approved criteria  -Morbid Obesity    Marshall Cork 11/09/2012, 1:19 PM

## 2012-11-09 NOTE — Progress Notes (Signed)
[facility] PROGRESS NOTE  Maria [name] ZOX:[id number] DOB: 03-08-1956 DOA: 11/08/2012 PCP: Oliver Barre, MD  Assessment/Plan: 1-Fever with leukocytosis: patient continue to experienced Fever and WBC's remains in the 15,000; patient also with worsening right CVA and flank pain. Will get CT scan of her abd and pelvis and will start patient on levaquin.  2-ARF: with concerns for pyelonephritis; will start patient on IVF's; check CT abd and pelvis and will follow BMET in am.  3-Chronic back pain: continue PRN percocet and dilaudid.  4-Candidiasis: will continue nystatin powder  5-Anxiety: continue xanax.  DVT:SCD's  Code Status: Full Family Communication: no family at bedside Disposition Plan: home when medically stable   Consultants:  none  Procedures:  CT abd and pelvis (pending)  Antibiotics:  levaquin 11/20  HPI/Subjective: Warm to touch and with fever overnight. Worsening right flank and CVA pain.  Objective: Filed Vitals:   11/09/12 0512 11/09/12 0600 11/09/12 0900 11/09/12 1350  BP:  115/54    Pulse:  96  86  Temp:  100.4 F (38 C) 99 F (37.2 C) 99.1 F (37.3 C)  TempSrc:   Oral Oral  Resp:  20    Height: 5\' 6"  (1.676 m)     Weight: 154.132 kg (339 lb 12.8 oz)     SpO2:  95%  96%    Intake/Output Summary (Last 24 hours) at 11/09/12 1430 Last data filed at 11/09/12 0500  Gross per 24 hour  Intake    720 ml  Output      2 ml  Net    718 ml   Filed Weights   11/09/12 0512  Weight: 154.132 kg (339 lb 12.8 oz)    Exam:   General:  Warm to touch; in acute distress due to back pain  Cardiovascular: mild tachycardia, no rubs or gallops  Respiratory: CTA bilaterally  Abdomen: obese, soft, ND; positive CVA (right side) and also right flank pain  Extremities: no cyanosis, no clubbing  Neuro: non focal  Data Reviewed: Basic Metabolic Panel:  Lab 11/09/12 8119 11/08/12 1400  NA 136 140  K 3.6 3.8  CL 98 100  CO2 29 27  GLUCOSE  111* 112*  BUN 22 16  CREATININE 1.25* 0.88  CALCIUM 8.8 9.4  MG -- --  PHOS -- --   Liver Function Tests:  Lab 11/08/12 2018  AST 24  ALT 35  ALKPHOS 60  BILITOT 0.8  PROT 6.4  ALBUMIN 3.4*   CBC:  Lab 11/09/12 0633 11/08/12 1400  WBC 15.9* 17.0*  NEUTROABS -- 14.7*  HGB 12.8 14.2  HCT 37.5 41.9  MCV 92.6 91.9  PLT 218 255   BNP (last 3 results)  Basename 06/28/12 1920  PROBNP 120.7     Recent Results (from the past 240 hour(s))  MRSA PCR SCREENING     Status: Normal   Collection Time   11/08/12  8:31 PM      Component Value Range Status Comment   MRSA by PCR NEGATIVE  NEGATIVE Final      Studies: Dg Chest 2 View  11/08/2012  *RADIOLOGY REPORT*  Clinical Data: Cough, fever, chills  CHEST - 2 VIEW  Comparison: Chest x-ray of 06/28/2012  Findings: No pneumonia or effusion is seen.  Mediastinal contours are stable.  The heart is mildly enlarged and stable.  A lower anterior cervical spine fusion plate is present.  No acute bony abnormality is seen.  IMPRESSION: No active lung disease.  Stable mild cardiomegaly.  Original Report Authenticated By: Dwyane Dee, M.D.     Scheduled Meds:   . [COMPLETED] acetaminophen  650 mg Oral Once  . ALPRAZolam  0.5 mg Oral BID  . [COMPLETED] fentaNYL  50 mcg Intravenous Once  . [COMPLETED]  HYDROmorphone (DILAUDID) injection  0.5 mg Intravenous Once  . levofloxacin (LEVAQUIN) IV  500 mg Intravenous Q24H  . [COMPLETED] ondansetron  4 mg Intravenous Once  . potassium chloride  10 mEq Oral BID  . [COMPLETED] sodium chloride  500 mL Intravenous Once  . sodium chloride  3 mL Intravenous Q12H  . [DISCONTINUED] hydrochlorothiazide  25 mg Oral Daily   Continuous Infusions:   . sodium chloride         Time spent: >30 minutes    Glora Hulgan  [facility] Pager [phone]. If 8PM-8AM, please contact night-coverage at [email or web address], password [facility] 11/09/2012, 2:30 PM  LOS: 1 day

## 2012-11-10 DIAGNOSIS — I10 Essential (primary) hypertension: Secondary | ICD-10-CM

## 2012-11-10 DIAGNOSIS — K219 Gastro-esophageal reflux disease without esophagitis: Secondary | ICD-10-CM

## 2012-11-10 DIAGNOSIS — E669 Obesity, unspecified: Secondary | ICD-10-CM

## 2012-11-10 DIAGNOSIS — M519 Unspecified thoracic, thoracolumbar and lumbosacral intervertebral disc disorder: Secondary | ICD-10-CM

## 2012-11-10 DIAGNOSIS — L03119 Cellulitis of unspecified part of limb: Secondary | ICD-10-CM

## 2012-11-10 DIAGNOSIS — L02419 Cutaneous abscess of limb, unspecified: Secondary | ICD-10-CM

## 2012-11-10 LAB — BASIC METABOLIC PANEL
CO2: 30 mEq/L (ref 19–32)
Chloride: 97 mEq/L (ref 96–112)
Glucose, Bld: 93 mg/dL (ref 70–99)
Potassium: 3.5 mEq/L (ref 3.5–5.1)
Sodium: 136 mEq/L (ref 135–145)

## 2012-11-10 LAB — URINE CULTURE

## 2012-11-10 LAB — CBC
Hemoglobin: 12.2 g/dL (ref 12.0–15.0)
Platelets: 218 10*3/uL (ref 150–400)
RBC: 3.94 MIL/uL (ref 3.87–5.11)
WBC: 9.3 10*3/uL (ref 4.0–10.5)

## 2012-11-10 MED ORDER — NYSTATIN 100000 UNIT/GM EX POWD: Freq: Two times a day (BID) | CUTANEOUS | Status: DC

## 2012-11-10 MED ORDER — DOXYCYCLINE HYCLATE 100 MG PO TABS: 100.0000 mg | ORAL_TABLET | Freq: Two times a day (BID) | ORAL | Status: AC

## 2012-11-10 MED ORDER — PANTOPRAZOLE SODIUM 40 MG PO TBEC: 40.0000 mg | DELAYED_RELEASE_TABLET | Freq: Every day | ORAL | Status: DC

## 2012-11-10 NOTE — Progress Notes (Signed)
Asked by MD to assist pt with medications at d/c. Pt needing Doxycycline 100 mg BID x 10 days. I called [facility] outpatient pharmacy to find out how much this would cost and they said $26.20. I made pt know that I could assist her with it but she would not qualify for any other help for 12 months. Pt chose to pay for her meds out of pocket.

## 2012-11-10 NOTE — Discharge Summary (Signed)
Physician Discharge Summary  Maria [name] WUJ:[id number] DOB: 1956/12/13 DOA: 11/08/2012  PCP: Oliver Barre, MD  Admit date: 11/08/2012 Discharge date: 11/10/2012  Time spent: >30 minutes  Recommendations for Outpatient Follow-up:  -Arrange follow up with PCP in 2 weeks (needs CBC and BMET to be checked during follow up appointment) -Take medications as prescribed and follow discharge instructions -Keep yourself well hydrated -Follow a low sodium and low calorie diet   Discharge Diagnoses:  Fever LLE cellulitis ANXIETY LOW BACK PAIN Leukocytosis ARF Obesity HTN GERD   Discharge Condition: stable and improved. Patient afebrile now, WBC's WNL and patient tolerating PO medications w/o difficulties. Renal function also back to baseline. CT abd and pelvis w/o acute abnormalities; LLE with erythema and changes suggesting cellulitis (which could explain her fever and elevated WBC's). Patient still with back pain (chronic); will need further evaluation and treatment at discharge. No urinary retention or incontinence, no paresthesia or other red flag symptoms associated with her back pain.  Diet recommendation: low calorie diet and low sodium diet.  Filed Weights   11/09/12 0512 11/10/12 1100  Weight: 154.132 kg (339 lb 12.8 oz) 153.1 kg (337 lb 8.4 oz)    History of present illness:  56 year old woman presented to ED with history of fever, whole body myalgias and increased low back pain.   Patient was in usual state of health yesterday. Last night she suddenly developed high fever, chills, rigors and myalgias. No cough, congestion, ST or rhinorrhea. No dysuria. No rash except under breasts and pannus/groin. Only focal complaint is increased low back pain (superimposed on long-standing chronic pain MRI lumbar spine 12/2008--moderate spinal stenosis). No strain or injury.   In ED today temperature 103.3, HR 100s, SBP maintained WNL, no hypoxia or tachypnea. WBC 18.0. CMP unremarkable,  urinalysis negative, CXR negative.  LLE warm and red on exam; with concerns for cellulitis  Hospital Course:  1-Fever with leukocytosis: patient symptoms now resolved; CT abd and pelvis w/o acute abnormalities and Urine cx w/o specific growth to indicate infection. CXR also W/O acute cardiopulmonary process. Fever and leukocytosis resolved with IVF's and antibiotics; source appears to be LLE cellulitis. Will d/c on doxycycline for a total of 10 days and close follow up by PCP.  2-ARF: with initial concerns for pyelonephritis, but r/o after CT abd and pelvis and also UA unrevealing any infection. Patient reports some decrease fluid intake and continue use of HCTZ. At this point BMET with Cr WNL now. Patient received good  Hydration, which mainly suggest pre-renal as reason for ARF.  3-Chronic back pain: continue PRN percocet and follow up with PCP and ortho if needed as an outpatient.   4-Candidiasis: continue nystatin powder twice a day and keep area clean and dry   5-Anxiety: continue xanax.  6-Obesity: low calorie diet and exercise discussed with patient.  7-HTN: stable; will continue HCTZ.  8-LLE cellulitis: will treat with doxycycline for a total of 10 days; physical measures discussed with patient as well.  9-GERD: started on protonix  * Rest of medical problems remains stable and the plan is to continue same medication regimen. Follow up with PCP in 2 weeks for further evaluation and treatment.   Procedures:  CXR and CT abd and pelvis; please see below for details and findings.  Consultations:  none  Discharge Exam: Filed Vitals:   11/09/12 2230 11/10/12 0600 11/10/12 1100 11/10/12 1413  BP: 145/65 146/67  116/72  Pulse: 86 81  71  Temp: 99.7 F (37.6 C)  99.3 F (37.4 C)  98.3 F (36.8 C)  TempSrc: Oral Oral  Oral  Resp: 20 20  18   Height:      Weight:   153.1 kg (337 lb 8.4 oz)   SpO2: 98% 91%  93%    General: NAD, afebrile; feeling better Cardiovascular:  RRR, no rubs or gallops Respiratory: CTA Abdomen: soft, obese, NT, ND, positive BS Extremities: LLE erythema and warmness to touch; no drainage or open wound. Neuro: non focal  Discharge Instructions  Discharge Orders    Future Orders Please Complete By Expires   Discharge instructions      Comments:   -Arrange follow up with PCP in 2 weeks -Take medications as prescribed and follow discharge instructions -Keep yourself well hydrated -Follow a low sodium and low calorie diet       Medication List     As of 11/10/2012  2:36 PM    TAKE these medications         albuterol 108 (90 BASE) MCG/ACT inhaler   Commonly known as: PROVENTIL HFA;VENTOLIN HFA   Inhale 2 puffs into the lungs every 4 (four) hours as needed for wheezing (cough, shortness of breath or wheezing.).      ALPRAZolam 0.5 MG tablet   Commonly known as: XANAX   Take 1 tablet (0.5 mg total) by mouth 2 (two) times daily.      diclofenac 75 MG EC tablet   Commonly known as: VOLTAREN   Take 1 tablet (75 mg total) by mouth 2 (two) times daily.      doxycycline 100 MG tablet   Commonly known as: VIBRA-TABS   Take 1 tablet (100 mg total) by mouth 2 (two) times daily.      hydrochlorothiazide 25 MG tablet   Commonly known as: HYDRODIURIL   Take 25 mg by mouth daily.      nystatin powder   Commonly known as: MYCOSTATIN   Apply topically 2 (two) times daily. Apply under breast skin and also groin pannus; keep area clean and dry.      oxyCODONE-acetaminophen 5-325 MG per tablet   Commonly known as: PERCOCET/ROXICET   Take 1 tablet by mouth every 6 (six) hours as needed.      pantoprazole 40 MG tablet   Commonly known as: PROTONIX   Take 1 tablet (40 mg total) by mouth daily at 12 noon.      potassium chloride 10 MEQ tablet   Commonly known as: K-DUR,KLOR-CON   Take 10 mEq by mouth 2 (two) times daily.            Follow-up Information    Follow up with Oliver Barre, MD. Schedule an appointment as soon as  possible for a visit in 2 weeks.   Contact information:   [address] [place] [address] [phone]           The results of significant diagnostics from this hospitalization (including imaging, microbiology, ancillary and laboratory) are listed below for reference.    Significant Diagnostic Studies: Ct Abdomen Pelvis Wo Contrast  11/09/2012  *RADIOLOGY REPORT*  Clinical Data: Abdominal pain with nausea, vomiting, fever, and elevated white blood count.  CT ABDOMEN AND PELVIS WITHOUT CONTRAST  Technique:  Multidetector CT imaging of the abdomen and pelvis was performed following the standard protocol without intravenous contrast.  Comparison: CT scan dated 06/30/2012  Findings: There is hepatic steatosis.  Liver is otherwise normal. Spleen, pancreas, adrenal glands, and  kidneys are normal.  There are a few diverticula in the distal [place].  Terminal ileum and appendix appear normal.  No free air or free fluid.  Ovaries are normal.  Uterus has been removed.  No acute osseous abnormalities. Facet arthritis at L3-4 bilaterally.  Minimal atelectasis at the lung bases.  IMPRESSION: No acute abnormality of the abdomen or pelvis.  Hepatic steatosis.   Original Report Authenticated By: Francene Boyers, M.D.    Dg Chest 2 View  11/08/2012  *RADIOLOGY REPORT*  Clinical Data: Cough, fever, chills  CHEST - 2 VIEW  Comparison: Chest x-ray of 06/28/2012  Findings: No pneumonia or effusion is seen.  Mediastinal contours are stable.  The heart is mildly enlarged and stable.  A lower anterior cervical spine fusion plate is present.  No acute bony abnormality is seen.  IMPRESSION: No active lung disease.  Stable mild cardiomegaly.   Original Report Authenticated By: Dwyane Dee, M.D.     Microbiology: Recent Results (from the past 240 hour(s))  CULTURE, BLOOD (ROUTINE X 2)     Status: Normal (Preliminary result)   Collection Time   11/08/12  2:00 PM      Component Value Range  Status Comment   Specimen Description BLOOD LEFT ARM   Final    Special Requests BOTTLES DRAWN AEROBIC AND ANAEROBIC 5CC   Final    Culture  Setup Time 11/09/2012 00:34   Final    Culture     Final    Value:        BLOOD CULTURE RECEIVED NO GROWTH TO DATE CULTURE WILL BE HELD FOR 5 DAYS BEFORE ISSUING A FINAL NEGATIVE REPORT   Report Status PENDING   Incomplete   URINE CULTURE     Status: Normal   Collection Time   11/08/12  2:03 PM      Component Value Range Status Comment   Specimen Description URINE, CATHETERIZED   Final    Special Requests NONE   Final    Culture  Setup Time 11/09/2012 06:41   Final    Colony Count 20,OOO COLONIES/ML   Final    Culture     Final    Value: Multiple bacterial morphotypes present, none predominant. Suggest appropriate recollection if clinically indicated.   Report Status 11/10/2012 FINAL   Final   CULTURE, BLOOD (ROUTINE X 2)     Status: Normal (Preliminary result)   Collection Time   11/08/12  3:10 PM      Component Value Range Status Comment   Specimen Description BLOOD LEFT ARM   Final    Special Requests BOTTLES DRAWN AEROBIC AND ANAEROBIC 3CC   Final    Culture  Setup Time 11/09/2012 00:34   Final    Culture     Final    Value:        BLOOD CULTURE RECEIVED NO GROWTH TO DATE CULTURE WILL BE HELD FOR 5 DAYS BEFORE ISSUING A FINAL NEGATIVE REPORT   Report Status PENDING   Incomplete   MRSA PCR SCREENING     Status: Normal   Collection Time   11/08/12  8:31 PM      Component Value Range Status Comment   MRSA by PCR NEGATIVE  NEGATIVE Final      Labs: Basic Metabolic Panel:  Lab 11/10/12 1610 11/09/12 0633 11/08/12 1400  NA 136 136 140  K 3.5 3.6 3.8  CL 97 98 100  CO2 30 29 27   GLUCOSE 93 111* 112*  BUN 15  22 16  CREATININE 0.90 1.25* 0.88  CALCIUM 8.5 8.8 9.4  MG -- -- --  PHOS -- -- --   Liver Function Tests:  Lab 11/08/12 2018  AST 24  ALT 35  ALKPHOS 60  BILITOT 0.8  PROT 6.4  ALBUMIN 3.4*   CBC:  Lab 11/10/12 0530  11/09/12 0633 11/08/12 1400  WBC 9.3 15.9* 17.0*  NEUTROABS -- -- 14.7*  HGB 12.2 12.8 14.2  HCT 36.4 37.5 41.9  MCV 92.4 92.6 91.9  PLT 218 218 255   BNP: BNP (last 3 results)  Basename 06/28/12 1920  PROBNP 120.7    Signed:  Catheleen Langhorne  [facility] 11/10/2012, 2:15 PM

## 2012-11-14 ENCOUNTER — Other Ambulatory Visit: Payer: Self-pay | Admitting: Internal Medicine

## 2012-11-14 NOTE — Telephone Encounter (Signed)
Faxed hardcopy to pharmacy. 

## 2012-11-14 NOTE — Telephone Encounter (Signed)
Done hardcopy to robin  

## 2012-11-15 LAB — CULTURE, BLOOD (ROUTINE X 2): Culture: NO GROWTH

## 2012-11-22 ENCOUNTER — Encounter: Payer: Self-pay | Admitting: Internal Medicine

## 2012-11-22 ENCOUNTER — Ambulatory Visit (INDEPENDENT_AMBULATORY_CARE_PROVIDER_SITE_OTHER): Payer: Self-pay | Admitting: Internal Medicine

## 2012-11-22 VITALS — BP 142/70 | HR 91 | Temp 98.4°F | Ht 66.0 in | Wt 334.0 lb

## 2012-11-22 DIAGNOSIS — L03119 Cellulitis of unspecified part of limb: Secondary | ICD-10-CM

## 2012-11-22 DIAGNOSIS — L03116 Cellulitis of left lower limb: Secondary | ICD-10-CM

## 2012-11-22 DIAGNOSIS — L02419 Cutaneous abscess of limb, unspecified: Secondary | ICD-10-CM

## 2012-11-22 DIAGNOSIS — G8929 Other chronic pain: Secondary | ICD-10-CM

## 2012-11-22 MED ORDER — OXYCODONE-ACETAMINOPHEN 7.5-325 MG PO TABS: 1.0000 | ORAL_TABLET | Freq: Three times a day (TID) | ORAL | Status: DC | PRN

## 2012-11-22 NOTE — Progress Notes (Signed)
Subjective:    Patient ID: Maria [name], female    DOB: Apr 23, 1956, 56 y.o.   MRN: [id number]  HPI  Here to f/u after recent hospn with fever and post LLE cellulitis now resolved, finished doxy though made her nauseas; no further fever, red/swelling/tender.  Pt continues to have recurring LBP without change in severity, bowel or bladder change, fever, wt loss,  worsening LE pain/numbness/weakness, gait change or falls, but pain uncontrolled recent.   Past Medical History  Diagnosis Date  . Acute lymphadenitis 08/13/2009  . Acute pharyngitis 02/10/2010  . ALLERGIC RHINITIS 08/10/2007  . ANXIETY 12/06/2007  . BACK PAIN 12/06/2007  . CHEST PAIN 08/13/2009  . DEPRESSION 12/06/2007  . DIVERTICULOSIS, [place] 12/06/2007  . GERD 12/06/2007  . HYPERTENSION 12/06/2007  . LOW BACK PAIN 12/06/2007  . MRSA 02/07/2009  . Pain in joint, multiple sites 04/21/2010  . PARONYCHIA, FINGER 08/13/2009  . RASH-NONVESICULAR 05/09/2008  . SHOULDER PAIN, LEFT 05/09/2008  . SINUSITIS- ACUTE-NOS 05/09/2008  . THORACIC/LUMBOSACRAL NEURITIS/RADICULITIS UNSPEC 12/28/2008  . Lumbar disc disease 03/09/2012  . Cervical disc disease 03/09/2012  . Chronic pain 03/09/2012  . Impaired glucose tolerance 03/13/2012  . Cellulitis    Past Surgical History  Procedure Date  . Abdominal hysterectomy   . S/p ovary cyst   . S/p right knee arthroscopy     Dr. Dion Saucier ortho  . Cervical disc surgery     reports that she quit smoking about 4 years ago. She has never used smokeless tobacco. She reports that she does not drink alcohol or use illicit drugs. family history includes Asthma in her daughter and sister; Cancer in her maternal uncle and other; Diabetes in her mother; Heart disease in her mother; and Hypertension in her mother. Allergies  Allergen Reactions  . Amoxicillin-Pot Clavulanate Nausea And Vomiting  . Codeine Other (See Comments)    hallucinations  . Morphine And Related Other (See Comments)    headaches  . Vicodin  (Hydrocodone-Acetaminophen) Other (See Comments)    hallucinations  . Latex Rash   Current Outpatient Prescriptions on File Prior to Visit  Medication Sig Dispense Refill  . albuterol (PROVENTIL HFA;VENTOLIN HFA) 108 (90 BASE) MCG/ACT inhaler Inhale 2 puffs into the lungs every 4 (four) hours as needed for wheezing (cough, shortness of breath or wheezing.).  1 Inhaler  1  . ALPRAZolam (XANAX) 0.5 MG tablet take 1 tablet by mouth twice a day  60 tablet  2  . diclofenac (VOLTAREN) 75 MG EC tablet Take 1 tablet (75 mg total) by mouth 2 (two) times daily.  60 tablet  3  . hydrochlorothiazide (HYDRODIURIL) 25 MG tablet Take 25 mg by mouth daily.      Marland Kitchen nystatin (MYCOSTATIN) powder Apply topically 2 (two) times daily. Apply under breast skin and also groin pannus; keep area clean and dry.    0  . pantoprazole (PROTONIX) 40 MG tablet Take 1 tablet (40 mg total) by mouth daily at 12 noon.  30 tablet  1  . potassium chloride (K-DUR,KLOR-CON) 10 MEQ tablet Take 10 mEq by mouth 2 (two) times daily.      . [DISCONTINUED] DULoxetine (CYMBALTA) 60 MG capsule Take 60 mg by mouth daily.        . [DISCONTINUED] fexofenadine (ALLEGRA) 180 MG tablet Take 180 mg by mouth daily.        . [DISCONTINUED] furosemide (LASIX) 40 MG tablet Take 40 mg by mouth daily.         Review of  Systems All otherwise neg per pt     Objective:   Physical Exam BP 142/70  Pulse 91  Temp 98.4 F (36.9 C) (Oral)  Ht 5\' 6"  (1.676 m)  Wt 334 lb (151.501 kg)  BMI 53.91 kg/m2  SpO2 97% Physical Exam  VS noted Constitutional: Pt appears well-developed and well-nourished.  HENT: Head: Normocephalic.  Right Ear: External ear normal.  Left Ear: External ear normal.  Eyes: Conjunctivae and EOM are normal. Pupils are equal, round, and reactive to light.  Neck: Normal range of motion. Neck supple.  Cardiovascular: Normal rate and regular rhythm.   Pulmonary/Chest: Effort normal and breath sounds normal.  Abd:  Soft, NT,  non-distended, + BS Neurological: Pt is alert. Not confused  Skin: Skin is warm. No erythema.  Psychiatric: Pt behavior is normal. Thought content normal.      Assessment & Plan:

## 2012-11-22 NOTE — Patient Instructions (Addendum)
Ok to stop the percocet 5/325 OK to start the percocet 7.5/325's as discussed Continue all other medications as before Please have the pharmacy call with any other refills you may need. Please continue your efforts at being more active, low cholesterol diet, and weight control, as you can OK to call for any onset of fever similar to what you have had recently, to consider empiric septra DS for 1 wk for presumed recurrent left leg skin infections Please return in 6 months, or sooner if needed

## 2012-11-22 NOTE — Assessment & Plan Note (Signed)
Resolved, but d/w pt, this would be her 4th febrile illness since June 2013, ok to consider start early antibx such as septra ds bid for 7 days for any recurrence of fever such as 101

## 2012-11-22 NOTE — Assessment & Plan Note (Signed)
Ok for percocet 7.5/325 prn,  to f/u any worsening symptoms or concerns

## 2012-12-22 ENCOUNTER — Telehealth: Payer: Self-pay | Admitting: Internal Medicine

## 2012-12-22 MED ORDER — ALBUTEROL SULFATE HFA 108 (90 BASE) MCG/ACT IN AERS: 2.0000 | INHALATION_SPRAY | RESPIRATORY_TRACT | Status: DC | PRN

## 2012-12-22 MED ORDER — OXYCODONE-ACETAMINOPHEN 7.5-325 MG PO TABS: 1.0000 | ORAL_TABLET | Freq: Three times a day (TID) | ORAL | Status: DC | PRN

## 2012-12-22 NOTE — Telephone Encounter (Signed)
Patient needs a refill on her percocet and her inhaler ([facility] [employer]) call when ready

## 2012-12-22 NOTE — Telephone Encounter (Signed)
Percocet Done hardcopy to robin  Inhaler refilled erx

## 2012-12-22 NOTE — Telephone Encounter (Signed)
Patient informed to pickup hardcopy at the front desk. 

## 2013-01-20 ENCOUNTER — Other Ambulatory Visit: Payer: Self-pay | Admitting: *Deleted

## 2013-01-20 MED ORDER — OXYCODONE-ACETAMINOPHEN 5-325 MG PO TABS: 1.0000 | ORAL_TABLET | Freq: Three times a day (TID) | ORAL | Status: DC | PRN

## 2013-01-20 NOTE — Telephone Encounter (Signed)
Done hardcopy to robin  

## 2013-01-20 NOTE — Telephone Encounter (Signed)
Pt is requesting refill of her Percocet, however she would like to go back to her 5-325 dosage. Rx last written 12/22/2012 #90 with 0 refills-please advise.

## 2013-01-23 NOTE — Telephone Encounter (Signed)
Patient informed to pickup hardcopy at the front desk. 

## 2013-02-10 ENCOUNTER — Other Ambulatory Visit: Payer: Self-pay | Admitting: Internal Medicine

## 2013-02-10 NOTE — Telephone Encounter (Signed)
Done hardcopy to robin  

## 2013-02-10 NOTE — Telephone Encounter (Signed)
Faxed hardcopy to pharmacy. 

## 2013-02-11 ENCOUNTER — Emergency Department (HOSPITAL_COMMUNITY)
Admission: EM | Admit: 2013-02-11 | Discharge: 2013-02-11 | Disposition: A | Discharge status: Home / Self Care | Payer: Self-pay | Attending: Emergency Medicine | Admitting: Emergency Medicine

## 2013-02-11 ENCOUNTER — Encounter (HOSPITAL_COMMUNITY): Payer: Self-pay | Admitting: Emergency Medicine

## 2013-02-11 DIAGNOSIS — Z862 Personal history of diseases of the blood and blood-forming organs and certain disorders involving the immune mechanism: Secondary | ICD-10-CM | POA: Insufficient documentation

## 2013-02-11 DIAGNOSIS — F3289 Other specified depressive episodes: Secondary | ICD-10-CM | POA: Insufficient documentation

## 2013-02-11 DIAGNOSIS — Z8639 Personal history of other endocrine, nutritional and metabolic disease: Secondary | ICD-10-CM | POA: Insufficient documentation

## 2013-02-11 DIAGNOSIS — F411 Generalized anxiety disorder: Secondary | ICD-10-CM | POA: Insufficient documentation

## 2013-02-11 DIAGNOSIS — Z8739 Personal history of other diseases of the musculoskeletal system and connective tissue: Secondary | ICD-10-CM | POA: Insufficient documentation

## 2013-02-11 DIAGNOSIS — Z8709 Personal history of other diseases of the respiratory system: Secondary | ICD-10-CM | POA: Insufficient documentation

## 2013-02-11 DIAGNOSIS — Z87891 Personal history of nicotine dependence: Secondary | ICD-10-CM | POA: Insufficient documentation

## 2013-02-11 DIAGNOSIS — Z8614 Personal history of Methicillin resistant Staphylococcus aureus infection: Secondary | ICD-10-CM | POA: Insufficient documentation

## 2013-02-11 DIAGNOSIS — Z8669 Personal history of other diseases of the nervous system and sense organs: Secondary | ICD-10-CM | POA: Insufficient documentation

## 2013-02-11 DIAGNOSIS — G8929 Other chronic pain: Secondary | ICD-10-CM | POA: Insufficient documentation

## 2013-02-11 DIAGNOSIS — Z8719 Personal history of other diseases of the digestive system: Secondary | ICD-10-CM | POA: Insufficient documentation

## 2013-02-11 DIAGNOSIS — M79609 Pain in unspecified limb: Secondary | ICD-10-CM | POA: Insufficient documentation

## 2013-02-11 DIAGNOSIS — F329 Major depressive disorder, single episode, unspecified: Secondary | ICD-10-CM | POA: Insufficient documentation

## 2013-02-11 DIAGNOSIS — I10 Essential (primary) hypertension: Secondary | ICD-10-CM | POA: Insufficient documentation

## 2013-02-11 DIAGNOSIS — M79661 Pain in right lower leg: Secondary | ICD-10-CM

## 2013-02-11 DIAGNOSIS — Z872 Personal history of diseases of the skin and subcutaneous tissue: Secondary | ICD-10-CM | POA: Insufficient documentation

## 2013-02-11 DIAGNOSIS — Z79899 Other long term (current) drug therapy: Secondary | ICD-10-CM | POA: Insufficient documentation

## 2013-02-11 LAB — CBC WITH DIFFERENTIAL/PLATELET
Eosinophils Absolute: 0.2 10*3/uL (ref 0.0–0.7)
Eosinophils Relative: 2 % (ref 0–5)
Hemoglobin: 14.8 g/dL (ref 12.0–15.0)
Lymphocytes Relative: 44 % (ref 12–46)
Lymphs Abs: 5 10*3/uL — ABNORMAL HIGH (ref 0.7–4.0)
MCH: 30.9 pg (ref 26.0–34.0)
MCV: 91.9 fL (ref 78.0–100.0)
Monocytes Relative: 7 % (ref 3–12)
RBC: 4.79 MIL/uL (ref 3.87–5.11)
WBC: 11.4 10*3/uL — ABNORMAL HIGH (ref 4.0–10.5)

## 2013-02-11 LAB — POCT I-STAT, CHEM 8
BUN: 15 mg/dL (ref 6–23)
Calcium, Ion: 1.07 mmol/L — ABNORMAL LOW (ref 1.12–1.23)
Chloride: 104 mEq/L (ref 96–112)
HCT: 45 % (ref 36.0–46.0)
Potassium: 4 mEq/L (ref 3.5–5.1)

## 2013-02-11 LAB — PROTIME-INR
INR: 1.01 (ref 0.00–1.49)
Prothrombin Time: 13.2 seconds (ref 11.6–15.2)

## 2013-02-11 MED ORDER — HYDROMORPHONE HCL 4 MG PO TABS: 4.0000 mg | ORAL_TABLET | ORAL | Status: DC | PRN

## 2013-02-11 MED ORDER — FENTANYL CITRATE 0.05 MG/ML IJ SOLN
100.0000 ug | Freq: Once | INTRAMUSCULAR | Status: AC
  Administered 2013-02-11: 100 ug via NASAL
  Filled 2013-02-11: qty 2

## 2013-02-11 NOTE — Progress Notes (Signed)
VASCULAR LAB PRELIMINARY  PRELIMINARY  PRELIMINARY  PRELIMINARY  Right lower extremity venous Doppler completed.    Preliminary report:  There is no DVT or SVT noted in the right lower extremity.  Irby Fails, RVT 02/11/2013, 5:40 PM

## 2013-02-11 NOTE — ED Notes (Signed)
Pt states that two nights ago started having right calf pain, describes it as tight, cramp feeling, like "charlie horse".  Pt states pain feeling only once in a while yesterday, but starting during the night the pain has gotten worse esp with weight bearing or ambulating.  Pt states the pain has also moved up to right thigh. No swelling/edema noted at this time. Back of calf is tighter than left side at this time

## 2013-02-11 NOTE — ED Provider Notes (Signed)
History     CSN: [id number]  Arrival date & time 02/11/13  1443   First MD Initiated Contact with Patient 02/11/13 1641      Chief Complaint  Patient presents with  . calf pain     right    (Consider location/radiation/quality/duration/timing/severity/associated sxs/prior treatment) HPI This 57 year old female has 2 days of gradually worsening constant right calf pain occasionally radiating to the right anteromedial thigh without redness to the skin trauma color change to her leg or weakness or numbness or recent prolonged immobilization, she is no chest pain cough shortness breath abdominal pain, she has baseline severe chronic back pain, the Percocet that she uses for her back pain is not helping her leg pain, sheet a recent admission for cellulitis to her left leg but this pain feels completely different than her skin is normal appearing on her right leg, there is no treatment prior to arrival, she does not have any history of DVT, there is no swelling to her right leg. Past Medical History  Diagnosis Date  . Acute lymphadenitis 08/13/2009  . Acute pharyngitis 02/10/2010  . ALLERGIC RHINITIS 08/10/2007  . ANXIETY 12/06/2007  . BACK PAIN 12/06/2007  . CHEST PAIN 08/13/2009  . DEPRESSION 12/06/2007  . DIVERTICULOSIS, [place] 12/06/2007  . GERD 12/06/2007  . HYPERTENSION 12/06/2007  . LOW BACK PAIN 12/06/2007  . MRSA 02/07/2009  . Pain in joint, multiple sites 04/21/2010  . PARONYCHIA, FINGER 08/13/2009  . RASH-NONVESICULAR 05/09/2008  . SHOULDER PAIN, LEFT 05/09/2008  . SINUSITIS- ACUTE-NOS 05/09/2008  . THORACIC/LUMBOSACRAL NEURITIS/RADICULITIS UNSPEC 12/28/2008  . Lumbar disc disease 03/09/2012  . Cervical disc disease 03/09/2012  . Chronic pain 03/09/2012  . Impaired glucose tolerance 03/13/2012  . Cellulitis     Past Surgical History  Procedure Laterality Date  . Abdominal hysterectomy    . S/p ovary cyst    . S/p right knee arthroscopy      Dr. Dion Saucier ortho  . Cervical disc  surgery      Family History  Problem Relation Age of Onset  . Heart disease Mother   . Hypertension Mother   . Diabetes Mother   . Asthma Sister   . Asthma Daughter   . Cancer Maternal Uncle     [place]  . Cancer Other     ovarian    History  Substance Use Topics  . Smoking status: Former Smoker -- 30 years    Quit date: 11/08/2008  . Smokeless tobacco: Never Used     Comment: quit 10/09  . Alcohol Use: No    OB History   Grav Para Term Preterm Abortions TAB SAB Ect Mult Living                  Review of Systems 10 Systems reviewed and are negative for acute change except as noted in the HPI. Allergies  Amoxicillin-pot clavulanate; Codeine; Morphine and related; Vicodin; and Latex  Home Medications   Current Outpatient Rx  Name  Route  Sig  Dispense  Refill  . albuterol (PROVENTIL HFA;VENTOLIN HFA) 108 (90 BASE) MCG/ACT inhaler   Inhalation   Inhale 2 puffs into the lungs every 4 (four) hours as needed for wheezing (cough, shortness of breath or wheezing.).   1 Inhaler   5   . ALPRAZolam (XANAX) 0.5 MG tablet      take 1 tablet by mouth twice a day   60 tablet   2   . hydrochlorothiazide (HYDRODIURIL) 25 MG tablet  Oral   Take 25 mg by mouth daily.         Marland Kitchen loperamide (IMODIUM) 2 MG capsule   Oral   Take 2 mg by mouth 4 (four) times daily as needed (for upset stomach).         . oxyCODONE-acetaminophen (ROXICET) 5-325 MG per tablet   Oral   Take 1 tablet by mouth 3 (three) times daily as needed for pain.   90 tablet   0   . potassium chloride (K-DUR,KLOR-CON) 10 MEQ tablet   Oral   Take 10 mEq by mouth 2 (two) times daily.         Marland Kitchen tiZANidine (ZANAFLEX) 4 MG tablet   Oral   Take 4 mg by mouth every 6 (six) hours as needed (for pain).         Marland Kitchen HYDROmorphone (DILAUDID) 4 MG tablet   Oral   Take 1 tablet (4 mg total) by mouth every 4 (four) hours as needed for pain.   20 tablet   0     BP 134/61  Pulse 92  Temp(Src) 98.1 F  (36.7 C) (Oral)  Resp 16  SpO2 100%  Physical Exam  Nursing note and vitals reviewed. Constitutional:  Awake, alert, nontoxic appearance.  HENT:  Head: Atraumatic.  Eyes: Right eye exhibits no discharge. Left eye exhibits no discharge.  Neck: Neck supple.  Cardiovascular: Normal rate and regular rhythm.   No murmur heard. Pulmonary/Chest: Effort normal and breath sounds normal. No respiratory distress. She has no wheezes. She has no rales. She exhibits no tenderness.  Abdominal: Soft. Bowel sounds are normal. She exhibits no distension. There is no tenderness. There is no rebound and no guarding.  Musculoskeletal: She exhibits tenderness. She exhibits no edema.  Baseline ROM, no obvious new focal weakness. Right leg is mildly tender to the anteromedial thigh without swelling or deformity, right knee has good range of motion with minimal baseline tenderness, right calf is diffusely tender with no swelling or color change noted, bilateral lower legs are symmetric in circumference of the calf region, right foot dorsalis pedis pulse intact, right foot Refill is 2 seconds right foot normal light touch right leg normal light touch good strength the right foot  Neurological: She is alert.  Mental status and motor strength appears baseline for patient and situation.  Skin: No rash noted.  Psychiatric: She has a normal mood and affect.    ED Course  Procedures (including critical care time) No DVT seen on [place]. Labs Reviewed  CBC WITH DIFFERENTIAL - Abnormal; Notable for the following:    WBC 11.4 (*)    Lymphs Abs 5.0 (*)    All other components within normal limits  POCT I-STAT, CHEM 8 - Abnormal; Notable for the following:    Calcium, Ion 1.07 (*)    Hemoglobin 15.3 (*)    All other components within normal limits  PROTIME-INR   No results found.   1. Calf pain, right       MDM  Pt stable in ED with no significant deterioration in condition.  Patient / Family / Caregiver  informed of clinical course, understand medical decision-making process, and agree with plan.  I doubt any other EMC precluding discharge at this time including, but not necessarily limited to the following:DVT.        Hurman Horn, MD 02/20/13 [phone]

## 2013-02-21 ENCOUNTER — Other Ambulatory Visit: Payer: Self-pay

## 2013-02-21 MED ORDER — OXYCODONE-ACETAMINOPHEN 7.5-325 MG PO TABS: 1.0000 | ORAL_TABLET | Freq: Three times a day (TID) | ORAL | Status: DC | PRN

## 2013-02-21 NOTE — Telephone Encounter (Signed)
Called the patient informed to pickup hardcopy at the front desk. 

## 2013-02-21 NOTE — Telephone Encounter (Signed)
Done hardcopy to robin  

## 2013-03-23 ENCOUNTER — Telehealth: Payer: Self-pay | Admitting: *Deleted

## 2013-03-23 MED ORDER — OXYCODONE-ACETAMINOPHEN 5-325 MG PO TABS: 1.0000 | ORAL_TABLET | Freq: Four times a day (QID) | ORAL | Status: DC | PRN

## 2013-03-23 NOTE — Telephone Encounter (Signed)
Left smg on triage stating she is needing new rx on her pain med percocet. Does not make any more 7.5 so will need 5/325 mg. Pt is requesting md to write four times a day since milligrams are being reduce...Raechel Chute

## 2013-03-23 NOTE — Telephone Encounter (Signed)
Done hardcopy to robin  

## 2013-03-24 NOTE — Telephone Encounter (Signed)
Called the patient informed to pickup hardcopy at the front desk. 

## 2013-04-05 ENCOUNTER — Ambulatory Visit (INDEPENDENT_AMBULATORY_CARE_PROVIDER_SITE_OTHER): Payer: Self-pay | Admitting: Internal Medicine

## 2013-04-05 ENCOUNTER — Encounter: Payer: Self-pay | Admitting: Internal Medicine

## 2013-04-05 VITALS — BP 122/80 | HR 84 | Temp 98.4°F | Ht 66.0 in | Wt 327.8 lb

## 2013-04-05 DIAGNOSIS — M545 Low back pain, unspecified: Secondary | ICD-10-CM | POA: Insufficient documentation

## 2013-04-05 DIAGNOSIS — F329 Major depressive disorder, single episode, unspecified: Secondary | ICD-10-CM

## 2013-04-05 DIAGNOSIS — F411 Generalized anxiety disorder: Secondary | ICD-10-CM

## 2013-04-05 DIAGNOSIS — M25561 Pain in right knee: Secondary | ICD-10-CM | POA: Insufficient documentation

## 2013-04-05 DIAGNOSIS — M79604 Pain in right leg: Secondary | ICD-10-CM | POA: Insufficient documentation

## 2013-04-05 DIAGNOSIS — I10 Essential (primary) hypertension: Secondary | ICD-10-CM

## 2013-04-05 DIAGNOSIS — M25569 Pain in unspecified knee: Secondary | ICD-10-CM

## 2013-04-05 MED ORDER — CLONAZEPAM 1 MG PO TABS: ORAL_TABLET | ORAL | Status: DC

## 2013-04-05 MED ORDER — CITALOPRAM HYDROBROMIDE 10 MG PO TABS: 10.0000 mg | ORAL_TABLET | Freq: Every day | ORAL | Status: DC

## 2013-04-05 NOTE — Assessment & Plan Note (Signed)
D/c xanax, change to klonopin bid prn, and citalopram 10 , declines counseling,  to f/u any worsening symptoms or concerns

## 2013-04-05 NOTE — Progress Notes (Signed)
Subjective:    Patient ID: Maria [name], female    DOB: 01-21-1956, 57 y.o.   MRN: [id number]  HPI  Here to f/u;   Has chronic pain, has to sleep on her side with pillow between the legs, has chronic pain during night and AM, also RLS, cant get to sleep, makes anxiety much worse, Alprazolam helps some but needs better.  Frustrated over not being albe to walk much even in the grocery. Pt continues to have recurring LBP without change in severity, bowel or bladder change, fever, wt loss,  worsening LE pain/numbness/weakness, gait change or falls, though close to falling when she gets back to car in the lot. Has seen ortho at one point, but told before she did not need further tx.   Past Medical History  Diagnosis Date  . Acute lymphadenitis 08/13/2009  . Acute pharyngitis 02/10/2010  . ALLERGIC RHINITIS 08/10/2007  . ANXIETY 12/06/2007  . BACK PAIN 12/06/2007  . CHEST PAIN 08/13/2009  . DEPRESSION 12/06/2007  . DIVERTICULOSIS, [place] 12/06/2007  . GERD 12/06/2007  . HYPERTENSION 12/06/2007  . LOW BACK PAIN 12/06/2007  . MRSA 02/07/2009  . Pain in joint, multiple sites 04/21/2010  . PARONYCHIA, FINGER 08/13/2009  . RASH-NONVESICULAR 05/09/2008  . SHOULDER PAIN, LEFT 05/09/2008  . SINUSITIS- ACUTE-NOS 05/09/2008  . THORACIC/LUMBOSACRAL NEURITIS/RADICULITIS UNSPEC 12/28/2008  . Lumbar disc disease 03/09/2012  . Cervical disc disease 03/09/2012  . Chronic pain 03/09/2012  . Impaired glucose tolerance 03/13/2012  . Cellulitis    Past Surgical History  Procedure Laterality Date  . Abdominal hysterectomy    . S/p ovary cyst    . S/p right knee arthroscopy      Dr. Dion Saucier ortho  . Cervical disc surgery      reports that she quit smoking about 4 years ago. She has never used smokeless tobacco. She reports that she does not drink alcohol or use illicit drugs. family history includes Asthma in her daughter and sister; Cancer in her maternal uncle and other; Diabetes in her mother; Heart disease in her  mother; and Hypertension in her mother. Allergies  Allergen Reactions  . Amoxicillin-Pot Clavulanate Nausea And Vomiting  . Codeine Other (See Comments)    hallucinations  . Morphine And Related Other (See Comments)    headaches  . Vicodin (Hydrocodone-Acetaminophen) Other (See Comments)    hallucinations  . Latex Rash   Current Outpatient Prescriptions on File Prior to Visit  Medication Sig Dispense Refill  . albuterol (PROVENTIL HFA;VENTOLIN HFA) 108 (90 BASE) MCG/ACT inhaler Inhale 2 puffs into the lungs every 4 (four) hours as needed for wheezing (cough, shortness of breath or wheezing.).  1 Inhaler  5  . hydrochlorothiazide (HYDRODIURIL) 25 MG tablet Take 25 mg by mouth daily.      Marland Kitchen loperamide (IMODIUM) 2 MG capsule Take 2 mg by mouth 4 (four) times daily as needed (for upset stomach).      . oxyCODONE-acetaminophen (ROXICET) 5-325 MG per tablet Take 1 tablet by mouth every 6 (six) hours as needed for pain.  120 tablet  0  . potassium chloride (K-DUR,KLOR-CON) 10 MEQ tablet Take 10 mEq by mouth 2 (two) times daily.      Marland Kitchen tiZANidine (ZANAFLEX) 4 MG tablet Take 4 mg by mouth every 6 (six) hours as needed (for pain).      . [DISCONTINUED] DULoxetine (CYMBALTA) 60 MG capsule Take 60 mg by mouth daily.        . [DISCONTINUED] fexofenadine (ALLEGRA) 180 MG  tablet Take 180 mg by mouth daily.        . [DISCONTINUED] furosemide (LASIX) 40 MG tablet Take 40 mg by mouth daily.         No current facility-administered medications on file prior to visit.   Review of Systems All otherwise neg per pt     Objective:   Physical Exam BP 122/80  Pulse 84  Temp(Src) 98.4 F (36.9 C) (Oral)  Ht 5\' 6"  (1.676 m)  Wt 327 lb 12 oz (148.666 kg)  BMI 52.93 kg/m2  SpO2 95% VS noted,  Constitutional: Pt appears well-developed and well-nourished. /morbid obese HENT: Head: NCAT.  Right Ear: External ear normal.  Left Ear: External ear normal.  Eyes: Conjunctivae and EOM are normal. Pupils are  equal, round, and reactive to light.  Neck: Normal range of motion. Neck supple.  Cardiovascular: Normal rate and regular rhythm.   Pulmonary/Chest: Effort normal and breath sounds normal.  Abd:  Soft, NT, non-distended, + BS Neurological: Pt is alert. Not confused , motor 5.5 Skin: Skin is warm. No erythema.  Psychiatric: Pt behavior is normal. Thought content normal but 2-3+ nervous.         Assessment & Plan:

## 2013-04-05 NOTE — Patient Instructions (Addendum)
OK to stop the alprazolam for now Please take all new medication as prescribed - the Klonopin (clonazepam) and citalopram 10 mg per day You will be contacted regarding the referral for: orthopedic at [facility], for right knee and back Please continue all other medications as before, and refills have been done if requested. Thank you for enrolling in MyChart. Please follow the instructions below to securely access your online medical record. MyChart allows you to send messages to your doctor, view your test results, renew your prescriptions, schedule appointments, and more.

## 2013-04-05 NOTE — Assessment & Plan Note (Signed)
For ortho referral 

## 2013-04-05 NOTE — Assessment & Plan Note (Signed)
stable overall by history and exam, recent data reviewed with pt, and pt to continue medical treatment as before,  to f/u any worsening symptoms or concerns BP Readings from Last 3 Encounters:  04/05/13 122/80  02/11/13 134/61  11/22/12 142/70

## 2013-04-05 NOTE — Assessment & Plan Note (Signed)
Verified nonsuicidal, declines cousnseling, for ssri as above

## 2013-04-06 ENCOUNTER — Encounter: Payer: Self-pay | Admitting: Internal Medicine

## 2013-04-07 NOTE — Telephone Encounter (Signed)
pcc's to address with pt - per email   Message -----   From: Jane Canary Bushart   Sent: 04/06/2013 5:37 PM   To: [facility] Clinical Pool   Subject: Non-Urgent Medical Question       I have an upcoming appointment on April 30th at [facility] with an Orthopedic Specialist and I need more information such as time and address with possible directions!! Please call me at [phone] at your earliest convenience. Thank You Betsabe A Armenti DOB 10/24/1956

## 2013-04-20 ENCOUNTER — Other Ambulatory Visit: Payer: Self-pay

## 2013-04-20 MED ORDER — OXYCODONE-ACETAMINOPHEN 5-325 MG PO TABS: 1.0000 | ORAL_TABLET | Freq: Four times a day (QID) | ORAL | Status: DC | PRN

## 2013-04-20 NOTE — Telephone Encounter (Signed)
Pt informed, Rx in cabinet for pt pick up  

## 2013-04-26 ENCOUNTER — Encounter: Payer: Self-pay | Admitting: Gastroenterology

## 2013-05-04 ENCOUNTER — Telehealth: Payer: Self-pay

## 2013-05-04 NOTE — Telephone Encounter (Signed)
Pt informed and will call the doctors office to the office visit note can be sent

## 2013-05-04 NOTE — Telephone Encounter (Signed)
Sorry, I would need documentation of the request and office visit note to do this; if you could request this, then I could do it

## 2013-05-04 NOTE — Telephone Encounter (Signed)
Pt called requesting an order be placed to [facility] for an open MRI per the recommendation of Orthopaedist at [facility]

## 2013-05-05 ENCOUNTER — Other Ambulatory Visit: Payer: Self-pay | Admitting: *Deleted

## 2013-05-05 DIAGNOSIS — M545 Low back pain, unspecified: Secondary | ICD-10-CM

## 2013-05-13 ENCOUNTER — Ambulatory Visit
Admission: RE | Admit: 2013-05-13 | Discharge: 2013-05-13 | Disposition: A | Discharge status: Home / Self Care | Payer: No Typology Code available for payment source | Source: Ambulatory Visit | Attending: *Deleted | Admitting: *Deleted

## 2013-05-13 DIAGNOSIS — M545 Low back pain, unspecified: Secondary | ICD-10-CM

## 2013-05-22 ENCOUNTER — Telehealth: Payer: Self-pay | Admitting: *Deleted

## 2013-05-22 MED ORDER — OXYCODONE-ACETAMINOPHEN 5-325 MG PO TABS
1.0000 | ORAL_TABLET | Freq: Four times a day (QID) | ORAL | Status: DC | PRN
Start: 2013-05-22 — End: 2013-06-13

## 2013-05-22 NOTE — Telephone Encounter (Signed)
Pt requesting refill of Percocet-last written 04/20/2013 #120 with 0 refills-please advise.

## 2013-05-22 NOTE — Telephone Encounter (Signed)
Done hardcopy to robin  

## 2013-05-22 NOTE — Telephone Encounter (Signed)
Called the patient informed to pickup hardcopy at the front desk. 

## 2013-05-26 ENCOUNTER — Encounter: Payer: Self-pay | Admitting: Internal Medicine

## 2013-05-26 ENCOUNTER — Telehealth: Payer: Self-pay

## 2013-05-26 DIAGNOSIS — A64 Unspecified sexually transmitted disease: Secondary | ICD-10-CM

## 2013-05-26 NOTE — Telephone Encounter (Signed)
Ok , will let PCC's know

## 2013-05-26 NOTE — Telephone Encounter (Signed)
[name] to contact pt   - needs HIV repeat testing as screen was reactive in July 2013  May need ID referral if repeat testing is abnormal

## 2013-05-26 NOTE — Telephone Encounter (Signed)
The patient would like an order to get her epidural injections at [facility] due to cost being lower without insurance

## 2013-05-29 ENCOUNTER — Ambulatory Visit: Payer: Self-pay

## 2013-05-29 DIAGNOSIS — A64 Unspecified sexually transmitted disease: Secondary | ICD-10-CM

## 2013-05-29 LAB — HIV ANTIBODY (ROUTINE TESTING W REFLEX): HIV: REACTIVE

## 2013-05-29 NOTE — Telephone Encounter (Signed)
Dr. Allena Katz in [place] is the MD she has seen .  He could do the injections at this location but they are too expensive.  He informed the patient her PCP could order the injections at [facility] and would be more affordable for her.  She stated her MRI results are in epic.

## 2013-05-29 NOTE — Telephone Encounter (Signed)
There is no referral in for pt

## 2013-05-29 NOTE — Telephone Encounter (Signed)
[name] to contact pt;  [facility] tell me that I have not referred her for any ESI (epidurals)  Maybe she needs to contact MD who ordered the shot?

## 2013-05-29 NOTE — Telephone Encounter (Signed)
Please ask pt to ask Dr patel to send me his last office visit note that justifies this  thanks

## 2013-05-30 ENCOUNTER — Other Ambulatory Visit: Payer: Self-pay | Admitting: Internal Medicine

## 2013-05-30 ENCOUNTER — Ambulatory Visit: Payer: Self-pay

## 2013-05-30 DIAGNOSIS — Z21 Asymptomatic human immunodeficiency virus [HIV] infection status: Secondary | ICD-10-CM

## 2013-05-30 NOTE — Telephone Encounter (Signed)
Faxed request to Dr. Allena Katz at [facility] at [phone].

## 2013-05-31 LAB — T-HELPER CELLS (CD4) COUNT (NOT AT ARMC)
Absolute CD4: 2829 /uL — ABNORMAL HIGH (ref 381–1469)
Total Lymphocyte: 40 % (ref 12–46)
Total lymphocyte count: 4160 /uL — ABNORMAL HIGH (ref 700–3300)

## 2013-06-01 ENCOUNTER — Telehealth: Payer: Self-pay

## 2013-06-01 NOTE — Telephone Encounter (Signed)
Referral received from Dr Jonny Ruiz for reactive HIV antibody evaluation.   Message left on machine to return call for New Patient Appointment.  No intake needed since it is only a consult.    Laurell Josephs, RN

## 2013-06-02 ENCOUNTER — Encounter: Payer: Self-pay | Admitting: Infectious Diseases

## 2013-06-02 ENCOUNTER — Ambulatory Visit (INDEPENDENT_AMBULATORY_CARE_PROVIDER_SITE_OTHER): Payer: Self-pay | Admitting: Infectious Diseases

## 2013-06-02 VITALS — BP 130/83 | HR 86 | Temp 98.5°F | Ht 66.0 in | Wt 323.0 lb

## 2013-06-02 DIAGNOSIS — R509 Fever, unspecified: Secondary | ICD-10-CM | POA: Insufficient documentation

## 2013-06-02 DIAGNOSIS — Z789 Other specified health status: Secondary | ICD-10-CM | POA: Insufficient documentation

## 2013-06-02 LAB — HIV-1 RNA, QUALITATIVE, TMA: HIV-1 RNA, Qualitative, TMA: NOT DETECTED

## 2013-06-02 NOTE — Assessment & Plan Note (Signed)
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 Will have her seen by rheum, would consider heme eval, CT chest, bone marrow bx?  as well. Will have her f/u with her PCP.

## 2013-06-02 NOTE — Progress Notes (Signed)
  Subjective:    Patient ID: Maria [name], female    DOB: Oct 19, 1956, 57 y.o.   MRN: [id number]  HPI 57 yo F with hx of 3 HIV + Ab tests. She was previously admitted  with high fever- was admitted 3 times last year with fever. She had CT kidneys, LE dopplers, all negative.  There was no f/u to her HIV test and she recently went into MyChart and reviewed her labs. She was then noted to have this test. She has since had a repeat test, HIV Ab+. She had CD4 normal and HIV RNA (-).  Has been feeling tired. Has knee and back pain, has been out of work for 3 years. No f/c. Has lost 23# since October (intentional) by changing her diet.   Review of Systems  Constitutional: Negative for fever, chills, appetite change and unexpected weight change.  Respiratory: Positive for shortness of breath. Negative for cough.        DOE. She attributes this to her weight.   Cardiovascular: Positive for leg swelling. Negative for chest pain.  Gastrointestinal: Negative for diarrhea and constipation.  Musculoskeletal: Positive for back pain and arthralgias. Negative for myalgias and joint swelling.       Spinal stenosis, DJD, bulging discs, arthritis.        Objective:   Physical Exam  Constitutional: She appears well-developed and well-nourished.  HENT:  Mouth/Throat: No oropharyngeal exudate.  Eyes: EOM are normal. Pupils are equal, round, and reactive to light.  Neck: Neck supple.  Cardiovascular: Normal rate, regular rhythm and normal heart sounds.   Pulmonary/Chest: Effort normal and breath sounds normal.  Abdominal: Soft. Bowel sounds are normal. She exhibits distension. There is no tenderness. There is no rebound and no guarding.  Musculoskeletal: She exhibits edema and tenderness.       Legs: Lymphadenopathy:    She has no cervical adenopathy.       Assessment & Plan:

## 2013-06-02 NOTE — Assessment & Plan Note (Signed)
Pt states that she has been celibate since since 2006. She had negative HIV testing then. She has had 3 false+ tests since. 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 Will have her seen by Rheum

## 2013-06-05 LAB — ANCA SCREEN W REFLEX TITER: Atypical p-ANCA Screen: NEGATIVE

## 2013-06-07 ENCOUNTER — Encounter (HOSPITAL_COMMUNITY): Payer: Self-pay

## 2013-06-07 ENCOUNTER — Inpatient Hospital Stay (HOSPITAL_COMMUNITY)
Admission: EM | Admit: 2013-06-07 | Discharge: 2013-06-09 | DRG: 392 | Disposition: A | Discharge status: Home / Self Care | Payer: MEDICAID | Attending: Internal Medicine | Admitting: Internal Medicine

## 2013-06-07 ENCOUNTER — Emergency Department (HOSPITAL_COMMUNITY): Payer: Self-pay

## 2013-06-07 DIAGNOSIS — N289 Disorder of kidney and ureter, unspecified: Secondary | ICD-10-CM | POA: Diagnosis present

## 2013-06-07 DIAGNOSIS — M25561 Pain in right knee: Secondary | ICD-10-CM

## 2013-06-07 DIAGNOSIS — D72829 Elevated white blood cell count, unspecified: Secondary | ICD-10-CM

## 2013-06-07 DIAGNOSIS — R609 Edema, unspecified: Secondary | ICD-10-CM

## 2013-06-07 DIAGNOSIS — M2550 Pain in unspecified joint: Secondary | ICD-10-CM

## 2013-06-07 DIAGNOSIS — G8929 Other chronic pain: Secondary | ICD-10-CM

## 2013-06-07 DIAGNOSIS — A4902 Methicillin resistant Staphylococcus aureus infection, unspecified site: Secondary | ICD-10-CM

## 2013-06-07 DIAGNOSIS — K529 Noninfective gastroenteritis and colitis, unspecified: Secondary | ICD-10-CM

## 2013-06-07 DIAGNOSIS — Z6841 Body Mass Index (BMI) 40.0 and over, adult: Secondary | ICD-10-CM

## 2013-06-07 DIAGNOSIS — M519 Unspecified thoracic, thoracolumbar and lumbosacral intervertebral disc disorder: Secondary | ICD-10-CM

## 2013-06-07 DIAGNOSIS — M5090 Cervical disc disorder, unspecified, unspecified cervical region: Secondary | ICD-10-CM

## 2013-06-07 DIAGNOSIS — K219 Gastro-esophageal reflux disease without esophagitis: Secondary | ICD-10-CM | POA: Diagnosis present

## 2013-06-07 DIAGNOSIS — M545 Low back pain, unspecified: Secondary | ICD-10-CM

## 2013-06-07 DIAGNOSIS — K5289 Other specified noninfective gastroenteritis and colitis: Principal | ICD-10-CM | POA: Diagnosis present

## 2013-06-07 DIAGNOSIS — E876 Hypokalemia: Secondary | ICD-10-CM

## 2013-06-07 DIAGNOSIS — B379 Candidiasis, unspecified: Secondary | ICD-10-CM

## 2013-06-07 DIAGNOSIS — Z0000 Encounter for general adult medical examination without abnormal findings: Secondary | ICD-10-CM

## 2013-06-07 DIAGNOSIS — R21 Rash and other nonspecific skin eruption: Secondary | ICD-10-CM

## 2013-06-07 DIAGNOSIS — Z87891 Personal history of nicotine dependence: Secondary | ICD-10-CM

## 2013-06-07 DIAGNOSIS — Z789 Other specified health status: Secondary | ICD-10-CM | POA: Diagnosis present

## 2013-06-07 DIAGNOSIS — R109 Unspecified abdominal pain: Secondary | ICD-10-CM

## 2013-06-07 DIAGNOSIS — F329 Major depressive disorder, single episode, unspecified: Secondary | ICD-10-CM

## 2013-06-07 DIAGNOSIS — R079 Chest pain, unspecified: Secondary | ICD-10-CM

## 2013-06-07 DIAGNOSIS — F411 Generalized anxiety disorder: Secondary | ICD-10-CM | POA: Diagnosis present

## 2013-06-07 DIAGNOSIS — K5730 Diverticulosis of large intestine without perforation or abscess without bleeding: Secondary | ICD-10-CM | POA: Diagnosis present

## 2013-06-07 DIAGNOSIS — J309 Allergic rhinitis, unspecified: Secondary | ICD-10-CM

## 2013-06-07 DIAGNOSIS — E669 Obesity, unspecified: Secondary | ICD-10-CM

## 2013-06-07 DIAGNOSIS — F3289 Other specified depressive episodes: Secondary | ICD-10-CM

## 2013-06-07 DIAGNOSIS — Z90710 Acquired absence of both cervix and uterus: Secondary | ICD-10-CM

## 2013-06-07 DIAGNOSIS — R7302 Impaired glucose tolerance (oral): Secondary | ICD-10-CM

## 2013-06-07 DIAGNOSIS — F1011 Alcohol abuse, in remission: Secondary | ICD-10-CM | POA: Diagnosis present

## 2013-06-07 DIAGNOSIS — I10 Essential (primary) hypertension: Secondary | ICD-10-CM | POA: Diagnosis present

## 2013-06-07 DIAGNOSIS — M79604 Pain in right leg: Secondary | ICD-10-CM

## 2013-06-07 DIAGNOSIS — R509 Fever, unspecified: Secondary | ICD-10-CM

## 2013-06-07 DIAGNOSIS — IMO0002 Reserved for concepts with insufficient information to code with codable children: Secondary | ICD-10-CM

## 2013-06-07 LAB — CBC WITH DIFFERENTIAL/PLATELET
Basophils Absolute: 0.1 10*3/uL (ref 0.0–0.1)
Eosinophils Absolute: 0.1 10*3/uL (ref 0.0–0.7)
Eosinophils Relative: 1 % (ref 0–5)
Lymphocytes Relative: 18 % (ref 12–46)
MCH: 30.5 pg (ref 26.0–34.0)
MCV: 90.4 fL (ref 78.0–100.0)
Neutrophils Relative %: 75 % (ref 43–77)
Platelets: 266 10*3/uL (ref 150–400)
RDW: 12.7 % (ref 11.5–15.5)
WBC: 20.6 10*3/uL — ABNORMAL HIGH (ref 4.0–10.5)

## 2013-06-07 LAB — COMPREHENSIVE METABOLIC PANEL
ALT: 23 U/L (ref 0–35)
AST: 21 U/L (ref 0–37)
Calcium: 9.1 mg/dL (ref 8.4–10.5)
Sodium: 138 mEq/L (ref 135–145)
Total Protein: 7.1 g/dL (ref 6.0–8.3)

## 2013-06-07 LAB — URINALYSIS, ROUTINE W REFLEX MICROSCOPIC
Glucose, UA: NEGATIVE mg/dL
Hgb urine dipstick: NEGATIVE
Specific Gravity, Urine: 1.013 (ref 1.005–1.030)
Urobilinogen, UA: 0.2 mg/dL (ref 0.0–1.0)
pH: 6 (ref 5.0–8.0)

## 2013-06-07 LAB — OCCULT BLOOD, POC DEVICE: Fecal Occult Bld: NEGATIVE

## 2013-06-07 MED ORDER — CIPROFLOXACIN IN D5W 400 MG/200ML IV SOLN
400.0000 mg | Freq: Two times a day (BID) | INTRAVENOUS | Status: DC
  Administered 2013-06-07 – 2013-06-09 (×5): 400 mg via INTRAVENOUS
  Filled 2013-06-07 (×6): qty 200

## 2013-06-07 MED ORDER — SODIUM CHLORIDE 0.9 % IV SOLN
INTRAVENOUS | Status: DC
  Administered 2013-06-07 – 2013-06-09 (×3): via INTRAVENOUS

## 2013-06-07 MED ORDER — PANTOPRAZOLE SODIUM 40 MG IV SOLR
40.0000 mg | INTRAVENOUS | Status: DC
  Administered 2013-06-07 – 2013-06-09 (×3): 40 mg via INTRAVENOUS
  Filled 2013-06-07 (×5): qty 40

## 2013-06-07 MED ORDER — OXYCODONE HCL 5 MG PO TABS
5.0000 mg | ORAL_TABLET | Freq: Four times a day (QID) | ORAL | Status: DC | PRN
  Administered 2013-06-07 – 2013-06-08 (×2): 5 mg via ORAL
  Filled 2013-06-07 (×2): qty 1

## 2013-06-07 MED ORDER — ONDANSETRON HCL 4 MG/2ML IJ SOLN
4.0000 mg | Freq: Once | INTRAMUSCULAR | Status: AC
  Administered 2013-06-07: 4 mg via INTRAVENOUS
  Filled 2013-06-07: qty 2

## 2013-06-07 MED ORDER — IOHEXOL 300 MG/ML  SOLN
100.0000 mL | Freq: Once | INTRAMUSCULAR | Status: AC | PRN
  Administered 2013-06-07: 100 mL via INTRAVENOUS

## 2013-06-07 MED ORDER — FENTANYL CITRATE 0.05 MG/ML IJ SOLN
50.0000 ug | Freq: Once | INTRAMUSCULAR | Status: AC
  Administered 2013-06-07: 50 ug via INTRAVENOUS
  Filled 2013-06-07: qty 2

## 2013-06-07 MED ORDER — CLONAZEPAM 0.5 MG PO TABS
0.5000 mg | ORAL_TABLET | Freq: Two times a day (BID) | ORAL | Status: DC | PRN
Start: 2013-06-07 — End: 2013-06-09
  Administered 2013-06-07 – 2013-06-09 (×3): 0.5 mg via ORAL
  Filled 2013-06-07 (×3): qty 1

## 2013-06-07 MED ORDER — METRONIDAZOLE IN NACL 5-0.79 MG/ML-% IV SOLN
500.0000 mg | Freq: Three times a day (TID) | INTRAVENOUS | Status: DC
  Administered 2013-06-07 – 2013-06-09 (×7): 500 mg via INTRAVENOUS
  Filled 2013-06-07 (×8): qty 100

## 2013-06-07 MED ORDER — HYDROMORPHONE HCL PF 1 MG/ML IJ SOLN
1.0000 mg | Freq: Once | INTRAMUSCULAR | Status: AC
  Administered 2013-06-07: 1 mg via INTRAVENOUS
  Filled 2013-06-07: qty 1

## 2013-06-07 MED ORDER — IOHEXOL 300 MG/ML  SOLN
50.0000 mL | Freq: Once | INTRAMUSCULAR | Status: AC | PRN
  Administered 2013-06-07: 50 mL via ORAL

## 2013-06-07 MED ORDER — ONDANSETRON HCL 4 MG/2ML IJ SOLN
4.0000 mg | Freq: Three times a day (TID) | INTRAMUSCULAR | Status: AC | PRN
  Administered 2013-06-07: 4 mg via INTRAVENOUS
  Filled 2013-06-07: qty 2

## 2013-06-07 MED ORDER — SODIUM CHLORIDE 0.9 % IV SOLN
INTRAVENOUS | Status: DC
  Administered 2013-06-07 (×2): via INTRAVENOUS

## 2013-06-07 MED ORDER — ENOXAPARIN SODIUM 80 MG/0.8ML ~~LOC~~ SOLN
70.0000 mg | SUBCUTANEOUS | Status: DC
  Administered 2013-06-07 – 2013-06-09 (×3): 70 mg via SUBCUTANEOUS
  Filled 2013-06-07 (×3): qty 0.8

## 2013-06-07 MED ORDER — ALBUTEROL SULFATE HFA 108 (90 BASE) MCG/ACT IN AERS
2.0000 | INHALATION_SPRAY | RESPIRATORY_TRACT | Status: DC | PRN
  Filled 2013-06-07: qty 6.7

## 2013-06-07 MED ORDER — HYDROCHLOROTHIAZIDE 25 MG PO TABS
25.0000 mg | ORAL_TABLET | Freq: Every day | ORAL | Status: DC
  Administered 2013-06-07 – 2013-06-09 (×3): 25 mg via ORAL
  Filled 2013-06-07 (×3): qty 1

## 2013-06-07 MED ORDER — TIZANIDINE HCL 4 MG PO TABS: 4.0000 mg | ORAL_TABLET | Freq: Four times a day (QID) | ORAL | Status: DC | PRN

## 2013-06-07 MED ORDER — ALUM & MAG HYDROXIDE-SIMETH 200-200-20 MG/5ML PO SUSP: 30.0000 mL | Freq: Four times a day (QID) | ORAL | Status: DC | PRN

## 2013-06-07 MED ORDER — CITALOPRAM HYDROBROMIDE 10 MG PO TABS
10.0000 mg | ORAL_TABLET | Freq: Every day | ORAL | Status: DC
  Administered 2013-06-07 – 2013-06-09 (×3): 10 mg via ORAL
  Filled 2013-06-07 (×3): qty 1

## 2013-06-07 MED ORDER — HYDROMORPHONE HCL PF 1 MG/ML IJ SOLN
1.0000 mg | INTRAMUSCULAR | Status: AC | PRN
  Administered 2013-06-07 (×2): 1 mg via INTRAVENOUS
  Filled 2013-06-07 (×3): qty 1

## 2013-06-07 NOTE — ED Notes (Signed)
Per EMS: pt c/o of abd pain that started 2100 after vomiting. Vomit was orange in color. Pt reports diarrhea that was dark. Pain located in epigastric region. Pt alert. Pt has hx of diverticulitis.

## 2013-06-07 NOTE — ED Notes (Signed)
[id number]<AV> Expected date:<BR> Expected time:<BR> Means of arrival:<BR> Comments:<BR> EMS/abdominal pain

## 2013-06-07 NOTE — Progress Notes (Signed)
[facility] AM ED CM Spoke with pt after receiving CM consult for May need home health assist at discharge  Pt reports she is not sure she will need a home health agency upon d/c and would like to wait until closer to d/c to make a choice CM noted in past EPIC notes [facility] Pt confirm she did but still would like to wait until close to d/c date to choose an agency  Pt offer home health choice including   HOME HEALTH AGENCIES SERVING [place]   Agencies that are Medicare-Certified and are affiliated with [facility] Agency  Telephone Number Address  [facility]   The [facility] has ownership interest in this company; however, you are under no obligation to use this agency. [phone] or  [phone] [address] [place], [place] [address] [email or web address]   Agencies that are Medicare-Certified and are not affiliated with [facility] Telephone Number Address  [facility] [phone] Fax [phone] [address], [address] [place], [place]  [address] [email or web address]  [facility] [phone] or [phone] Fax [phone] [address] [place], [place] [address] [email or web address]  [facility] [phone] Fax [phone] [address] [place], [place] [address] [email or web address]  [facility] [phone] Fax [phone] [address] [place], [place]  [address] [email or web address]  [facility] [phone] Fax [phone] [address], [address] [place], [place] [address] [email or web address]  [facility] [phone] [address] [place], [place]  [address] [email or web address]  [facility] [phone]  [address] [place], [place] [address] [email or web address]  [facility] [phone] or [phone] Fax number [phone] [address] [place], [place]  [address] [email or web address]  [facility] [phone] Fax [phone] [address] [place], [place]  [address]  [facility]  [phone] Fax [phone] [address] [place], [place] [address] [email or web address]

## 2013-06-07 NOTE — Progress Notes (Signed)
[facility] has seen patient. Patient stating that she is hoping to get insurance along with Disability. Patient currently see the doctor at [facility].

## 2013-06-07 NOTE — ED Provider Notes (Addendum)
History     CSN: [id number]  Arrival date & time 06/07/13  0059   First MD Initiated Contact with Patient 06/07/13 0304      Chief Complaint  Patient presents with  . Abdominal Pain  . Emesis  . Diarrhea    (Consider location/radiation/quality/duration/timing/severity/associated sxs/prior treatment) HPI History is provided by the patient. Abdominal pain with onset tonight around 9 PM after vomiting. No blood in emesis. Also having some diarrhea. States she has black stools the last 24 hours. No history of GI bleeding. Pain is located mid abdomen, sharp in quality and severe. Nonradiating pain. No chest pain. No shortness of breath. No fevers. No chills. No history of abdominal surgeries  Past Medical History  Diagnosis Date  . Acute lymphadenitis 08/13/2009  . Acute pharyngitis 02/10/2010  . ALLERGIC RHINITIS 08/10/2007  . ANXIETY 12/06/2007  . BACK PAIN 12/06/2007  . CHEST PAIN 08/13/2009  . DEPRESSION 12/06/2007  . DIVERTICULOSIS, [place] 12/06/2007  . GERD 12/06/2007  . HYPERTENSION 12/06/2007  . LOW BACK PAIN 12/06/2007  . MRSA 02/07/2009  . Pain in joint, multiple sites 04/21/2010  . PARONYCHIA, FINGER 08/13/2009  . RASH-NONVESICULAR 05/09/2008  . SHOULDER PAIN, LEFT 05/09/2008  . SINUSITIS- ACUTE-NOS 05/09/2008  . THORACIC/LUMBOSACRAL NEURITIS/RADICULITIS UNSPEC 12/28/2008  . Lumbar disc disease 03/09/2012  . Cervical disc disease 03/09/2012  . Chronic pain 03/09/2012  . Impaired glucose tolerance 03/13/2012  . Cellulitis     Past Surgical History  Procedure Laterality Date  . Abdominal hysterectomy    . S/p ovary cyst    . S/p right knee arthroscopy      Dr. Dion Saucier ortho  . Cervical disc surgery      Family History  Problem Relation Age of Onset  . Heart disease Mother   . Hypertension Mother   . Diabetes Mother   . Asthma Sister   . Asthma Daughter   . Cancer Maternal Uncle     [place]  . Cancer Other     ovarian    History  Substance Use Topics  . Smoking  status: Former Smoker -- 30 years    Quit date: 11/08/2008  . Smokeless tobacco: Never Used     Comment: quit 10/09  . Alcohol Use: No    OB History   Grav Para Term Preterm Abortions TAB SAB Ect Mult Living                  Review of Systems  Constitutional: Negative for fever and chills.  HENT: Negative for neck pain and neck stiffness.   Eyes: Negative for pain.  Respiratory: Negative for shortness of breath.   Cardiovascular: Negative for chest pain.  Gastrointestinal: Positive for vomiting, abdominal pain and diarrhea.  Genitourinary: Negative for dysuria.  Musculoskeletal: Negative for back pain.  Skin: Negative for rash.  Neurological: Negative for headaches.  All other systems reviewed and are negative.    Allergies  Amoxicillin-pot clavulanate; Codeine; Morphine and related; Vicodin; and Latex  Home Medications   Current Outpatient Rx  Name  Route  Sig  Dispense  Refill  . albuterol (PROVENTIL HFA;VENTOLIN HFA) 108 (90 BASE) MCG/ACT inhaler   Inhalation   Inhale 2 puffs into the lungs every 4 (four) hours as needed for wheezing (cough, shortness of breath or wheezing.).   1 Inhaler   5   . citalopram (CELEXA) 10 MG tablet   Oral   Take 1 tablet (10 mg total) by mouth daily.   90 tablet  3   . clonazePAM (KLONOPIN) 0.5 MG tablet   Oral   Take 0.5 mg by mouth 2 (two) times daily as needed for anxiety.         . hydrochlorothiazide (HYDRODIURIL) 25 MG tablet   Oral   Take 25 mg by mouth daily.         Marland Kitchen loperamide (IMODIUM) 2 MG capsule   Oral   Take 2 mg by mouth 4 (four) times daily as needed for diarrhea or loose stools (for upset stomach).          . oxyCODONE-acetaminophen (ROXICET) 5-325 MG per tablet   Oral   Take 1 tablet by mouth every 6 (six) hours as needed for pain.   120 tablet   0   . tiZANidine (ZANAFLEX) 4 MG tablet   Oral   Take 4 mg by mouth every 6 (six) hours as needed (for pain). Muscle spasms           BP  121/64  Pulse 73  Temp(Src) 98.5 F (36.9 C) (Oral)  Resp 18  Ht 5\' 6"  (1.676 m)  Wt 323 lb (146.512 kg)  BMI 52.16 kg/m2  SpO2 95%  Physical Exam  Nursing note and vitals reviewed. Constitutional: She is oriented to person, place, and time. She appears well-developed and well-nourished.  HENT:  Head: Normocephalic and atraumatic.  Eyes: EOM are normal. Pupils are equal, round, and reactive to light. No scleral icterus.  Neck: Neck supple.  Cardiovascular: Normal rate, normal heart sounds and intact distal pulses.   Pulmonary/Chest: Effort normal and breath sounds normal. No respiratory distress.  Abdominal: Soft. Bowel sounds are normal. She exhibits no distension.  Tender palpation epigastric and periumbilical with some voluntary guarding, no tenderness otherwise  Genitourinary:  Rectal exam: Nontender no masses with dark stool in vault  Musculoskeletal: Normal range of motion. She exhibits no edema.  Neurological: She is alert and oriented to person, place, and time.  Skin: Skin is warm and dry.    ED Course  Procedures (including critical care time)  Results for orders placed during the hospital encounter of 06/07/13  CBC WITH DIFFERENTIAL      Result Value Range   WBC 20.6 (*) 4.0 - 10.5 K/uL   RBC 4.88  3.87 - 5.11 MIL/uL   Hemoglobin 14.9  12.0 - 15.0 g/dL   HCT 16.1  09.6 - 04.5 %   MCV 90.4  78.0 - 100.0 fL   MCH 30.5  26.0 - 34.0 pg   MCHC 33.8  30.0 - 36.0 g/dL   RDW 40.9  81.1 - 91.4 %   Platelets 266  150 - 400 K/uL   Neutrophils Relative % 75  43 - 77 %   Neutro Abs 15.4 (*) 1.7 - 7.7 K/uL   Lymphocytes Relative 18  12 - 46 %   Lymphs Abs 3.7  0.7 - 4.0 K/uL   Monocytes Relative 6  3 - 12 %   Monocytes Absolute 1.3 (*) 0.1 - 1.0 K/uL   Eosinophils Relative 1  0 - 5 %   Eosinophils Absolute 0.1  0.0 - 0.7 K/uL   Basophils Relative 0  0 - 1 %   Basophils Absolute 0.1  0.0 - 0.1 K/uL  COMPREHENSIVE METABOLIC PANEL      Result Value Range   Sodium 138   135 - 145 mEq/L   Potassium 3.7  3.5 - 5.1 mEq/L   Chloride 96  96 - 112 mEq/L  CO2 30  19 - 32 mEq/L   Glucose, Bld 120 (*) 70 - 99 mg/dL   BUN 9  6 - 23 mg/dL   Creatinine, Ser 2.59  0.50 - 1.10 mg/dL   Calcium 9.1  8.4 - 56.3 mg/dL   Total Protein 7.1  6.0 - 8.3 g/dL   Albumin 3.9  3.5 - 5.2 g/dL   AST 21  0 - 37 U/L   ALT 23  0 - 35 U/L   Alkaline Phosphatase 68  39 - 117 U/L   Total Bilirubin 0.6  0.3 - 1.2 mg/dL   GFR calc non Af Amer >90  >90 mL/min   GFR calc Af Amer >90  >90 mL/min  LIPASE, BLOOD      Result Value Range   Lipase 16  11 - 59 U/L  URINALYSIS, ROUTINE W REFLEX MICROSCOPIC      Result Value Range   Color, Urine YELLOW  YELLOW   APPearance CLOUDY (*) CLEAR   Specific Gravity, Urine 1.013  1.005 - 1.030   pH 6.0  5.0 - 8.0   Glucose, UA NEGATIVE  NEGATIVE mg/dL   Hgb urine dipstick NEGATIVE  NEGATIVE   Bilirubin Urine NEGATIVE  NEGATIVE   Ketones, ur NEGATIVE  NEGATIVE mg/dL   Protein, ur NEGATIVE  NEGATIVE mg/dL   Urobilinogen, UA 0.2  0.0 - 1.0 mg/dL   Nitrite NEGATIVE  NEGATIVE   Leukocytes, UA NEGATIVE  NEGATIVE  OCCULT BLOOD, POC DEVICE      Result Value Range   Fecal Occult Bld NEGATIVE  NEGATIVE   Mr Lumbar Spine Wo Contrast  05/13/2013   *RADIOLOGY REPORT*  Clinical Data: Chronic low back pain with difficulty walking. Symptoms worsening.  Numbness on the right.  MRI LUMBAR SPINE WITHOUT CONTRAST  Technique:  Multiplanar and multiecho pulse sequences of the lumbar spine were obtained without intravenous contrast.  Comparison: MRI 01/13/2009  Findings: There is no abnormality at L1-2 or above.  L2-3:  Mild bulging of the disc.  Mild facet and ligamentous hypertrophy.  Mild stenosis of the lateral recesses left more than right.  L3-4:  Bilateral facet arthropathy with anterolisthesis of 2 mm. Bulging of the disc.  Mild stenosis of the canal and lateral recesses bilaterally.  L4-5:  Circumferential bulging of the disc.  Facet degeneration hypertrophy  left worse than right.  Lateral recess narrowing left worse than right.  L5-S1:  Some transitional features.  No disc pathology.  Wide patency of the canal and foramina.  Since the previous study, the findings appear quite similar.  IMPRESSION: L2-3:  Disc bulge.  Facet degeneration.  Mild narrowing of the lateral recesses without gross neural compression.  L3-4:  Bilateral facet arthropathy with some edematous change.  2 mm of anterolisthesis.  Bulging of the disc.  Spinal stenosis that could cause neural compression on either or both sides.  This could worsen with standing or flexion.  L4-5:  Disc bulge.  Bilateral facet and ligamentous hypertrophy. Narrowing of the lateral recesses bilaterally that could be symptomatic.   Original Report Authenticated By: Paulina Fusi, M.D.   Ct Abdomen Pelvis W Contrast  06/07/2013   *RADIOLOGY REPORT*  Clinical Data: Abdominal pain.  Diarrhea.  Nausea and vomiting.  CT ABDOMEN AND PELVIS WITH CONTRAST  Technique:  Multidetector CT imaging of the abdomen and pelvis was performed following the standard protocol during bolus administration of intravenous contrast.  Contrast: OMNIPAQUE IOHEXOL 300 MG/ML  SOLN  Comparison: CT of the  abdomen and pelvis 11/09/2012.  Findings:  Lung Bases: Unremarkable.  Abdomen/Pelvis:  Low attenuation throughout the hepatic parenchyma, suggestive of hepatic steatosis (difficult to say for certain on today's contrast enhanced study).  The appearance of the gallbladder, pancreas, spleen, bilateral adrenal glands and left kidney is unremarkable.  In the lower pole of the right kidney there is an 11 mm intermediate attenuation lesions which appears very similar in size to prior examinations dating back to 2007, favored to represent a proteinaceous cyst.  A trace volume of free fluid in the right pericolic gutter, and in the anatomic pelvis.  No larger volume of ascites.  No pneumoperitoneum.  No pathologic distension of small bowel. Numerous  colonic diverticula are noted, particularly in the region of the sigmoid [place], without definite surrounding inflammatory changes to suggest acute diverticulitis at this time.  No definite pathologic lymphadenopathy identified within the abdomen or pelvis. Mild atherosclerosis throughout the abdominal and pelvic vasculature, without definite aneurysm or dissection.  Status post hysterectomy.  Ovaries are not confidently identified and may be surgically absent or atrophic.  The urinary bladder is unremarkable in appearance.  Musculoskeletal: There are no aggressive appearing lytic or blastic lesions noted in the visualized portions of the skeleton.  IMPRESSION: 1.  Trace volume of ascites in the pelvis and right pericolic gutter.  The exact source and significance of this ascites is uncertain. 2.  Colonic diverticulosis without definite findings to suggest acute diverticulitis at this time. 3.  Probable hepatic steatosis redemonstrated. 4.  Intermediate attenuation 11 mm lesion in the lower pole of the right kidney similar to numerous prior examinations, favored to represent a proteinaceous cyst. 5.  Atherosclerosis. 6.  Normal appendix.   Original Report Authenticated By: Trudie Reed, M.D.   IV fluids. IV fentanyl. IV Dilaudid.    Date: 06/07/2013  Rate: 73  Rhythm: normal sinus rhythm  QRS Axis: normal  Intervals: normal  ST/T Wave abnormalities: nonspecific ST changes  Conduction Disutrbances:none  Narrative Interpretation:   Old EKG Reviewed: none available  6:27 AM on recheck pain is returning and patient requesting more pain medication. CT results shared with patient. Medicine consult.  6:38 AM  Discussed with Dr. Eben Burow  - plan medical admission  MDM  Abdominal pain with nausea vomiting diarrhea.   EKG. Labs. Imaging. Leukocytosis 20,000  IV narcotics and IV fluids - remains in significant discomfort  Medical admission        Sunnie Nielsen, MD 06/07/13 [id number]  Sunnie Nielsen,  MD 06/07/13 [phone]

## 2013-06-07 NOTE — Progress Notes (Signed)
UR completed 

## 2013-06-07 NOTE — H&P (Addendum)
PCP:   Oliver Barre, MD   Chief Complaint:  Abdominal pain  HPI: 57 year old female who came to the ED today with abdominal pain which started last night after patient had frozen vegetables for dinner. Patient says that the patient pain was colicky in nature, she also had 2 episodes of vomiting this morning. Patient says that she has been having diarrhea over the past 3 days and she has noticed black colored stools. She denies any recent antibiotic use. Patient has a history of false-positive HIV, and was recently seen by ID in the clinic. She supposed to follow up with rheumatology as outpatient. Today patient had a CT scan of the abdomen and pelvis which showed mild ascitic fluid in the right pelvis and paracolic cutter. She also has a history of hepatic steatosis. Patient has long-standing history of alcohol abuse but she quit drinking many years ago. Patient denies any chest pain. She does have a history of anxiety disorder and fibromyalgia. She denies shortness of breath no cough no fever.  Allergies:   Allergies  Allergen Reactions  . Amoxicillin-Pot Clavulanate Nausea And Vomiting  . Codeine Other (See Comments)    hallucinations  . Morphine And Related Other (See Comments)    headaches  . Vicodin (Hydrocodone-Acetaminophen) Other (See Comments)    hallucinations  . Latex Rash      Past Medical History  Diagnosis Date  . Acute lymphadenitis 08/13/2009  . Acute pharyngitis 02/10/2010  . ALLERGIC RHINITIS 08/10/2007  . ANXIETY 12/06/2007  . BACK PAIN 12/06/2007  . CHEST PAIN 08/13/2009  . DEPRESSION 12/06/2007  . DIVERTICULOSIS, [place] 12/06/2007  . GERD 12/06/2007  . HYPERTENSION 12/06/2007  . LOW BACK PAIN 12/06/2007  . MRSA 02/07/2009  . Pain in joint, multiple sites 04/21/2010  . PARONYCHIA, FINGER 08/13/2009  . RASH-NONVESICULAR 05/09/2008  . SHOULDER PAIN, LEFT 05/09/2008  . SINUSITIS- ACUTE-NOS 05/09/2008  . THORACIC/LUMBOSACRAL NEURITIS/RADICULITIS UNSPEC 12/28/2008  . Lumbar  disc disease 03/09/2012  . Cervical disc disease 03/09/2012  . Chronic pain 03/09/2012  . Impaired glucose tolerance 03/13/2012  . Cellulitis     Past Surgical History  Procedure Laterality Date  . Abdominal hysterectomy    . S/p ovary cyst    . S/p right knee arthroscopy      Dr. Dion Saucier ortho  . Cervical disc surgery      Prior to Admission medications   Medication Sig Start Date End Date Taking? Authorizing Provider  albuterol (PROVENTIL HFA;VENTOLIN HFA) 108 (90 BASE) MCG/ACT inhaler Inhale 2 puffs into the lungs every 4 (four) hours as needed for wheezing (cough, shortness of breath or wheezing.). 12/22/12  Yes Corwin Levins, MD  citalopram (CELEXA) 10 MG tablet Take 1 tablet (10 mg total) by mouth daily. 04/05/13  Yes Corwin Levins, MD  clonazePAM (KLONOPIN) 0.5 MG tablet Take 0.5 mg by mouth 2 (two) times daily as needed for anxiety.   Yes Historical Provider, MD  hydrochlorothiazide (HYDRODIURIL) 25 MG tablet Take 25 mg by mouth daily.   Yes Historical Provider, MD  loperamide (IMODIUM) 2 MG capsule Take 2 mg by mouth 4 (four) times daily as needed for diarrhea or loose stools (for upset stomach).    Yes Historical Provider, MD  oxyCODONE-acetaminophen (ROXICET) 5-325 MG per tablet Take 1 tablet by mouth every 6 (six) hours as needed for pain. 05/22/13  Yes Corwin Levins, MD  tiZANidine (ZANAFLEX) 4 MG tablet Take 4 mg by mouth every 6 (six) hours as needed (for pain). Muscle  spasms   Yes Historical Provider, MD    Social History:  reports that she quit smoking about 4 years ago. She has never used smokeless tobacco. She reports that she does not drink alcohol or use illicit drugs.  Family History  Problem Relation Age of Onset  . Heart disease Mother   . Hypertension Mother   . Diabetes Mother   . Asthma Sister   . Asthma Daughter   . Cancer Maternal Uncle     [place]  . Cancer Other     ovarian    Review of Systems:  HEENT: Denies headache, blurred vision, runny nose, sore  throat,  Neck: Denies thyroid problems,lymphadenopathy Chest : Denies shortness of breath, no history of COPD Heart : Denies Chest pain,  coronary arterey disease GI: See history of present illness GU: Denies dysuria, urgency, frequency of urination, hematuria Neuro: Denies stroke, seizures, syncope Psych: Denies depression, anxiety, hallucinations   Physical Exam: Blood pressure 129/61, pulse 72, temperature 98.3 F (36.8 C), temperature source Oral, resp. rate 15, height 5\' 6"  (1.676 m), weight 146.512 kg (323 lb), SpO2 92.00%. Constitutional:   Patient is a well-developed and well-nourished female* in no acute distress and cooperative with exam. Head: Normocephalic and atraumatic Mouth: Mucus membranes moist Eyes: PERRL, EOMI, conjunctivae normal Neck: Supple, No Thyromegaly Cardiovascular: RRR, S1 normal, S2 normal Pulmonary/Chest: CTAB, no wheezes, rales, or rhonchi Abdominal: Soft. Mild generalized tenderness , distended, bowel sounds are normal, no masses, organomegaly, or guarding present.  Neurological: A&O x3, Strenght is normal and symmetric bilaterally, cranial nerve II-XII are grossly intact, no focal motor deficit, sensory intact to light touch bilaterally.  Extremities : No Cyanosis, Clubbing or Edema   Labs on Admission:  Results for orders placed during the hospital encounter of 06/07/13 (from the past 48 hour(s))  CBC WITH DIFFERENTIAL     Status: Abnormal   Collection Time    06/07/13  2:01 AM      Result Value Range   WBC 20.6 (*) 4.0 - 10.5 K/uL   RBC 4.88  3.87 - 5.11 MIL/uL   Hemoglobin 14.9  12.0 - 15.0 g/dL   HCT 16.1  09.6 - 04.5 %   MCV 90.4  78.0 - 100.0 fL   MCH 30.5  26.0 - 34.0 pg   MCHC 33.8  30.0 - 36.0 g/dL   RDW 40.9  81.1 - 91.4 %   Platelets 266  150 - 400 K/uL   Neutrophils Relative % 75  43 - 77 %   Neutro Abs 15.4 (*) 1.7 - 7.7 K/uL   Lymphocytes Relative 18  12 - 46 %   Lymphs Abs 3.7  0.7 - 4.0 K/uL   Monocytes Relative 6  3 - 12 %    Monocytes Absolute 1.3 (*) 0.1 - 1.0 K/uL   Eosinophils Relative 1  0 - 5 %   Eosinophils Absolute 0.1  0.0 - 0.7 K/uL   Basophils Relative 0  0 - 1 %   Basophils Absolute 0.1  0.0 - 0.1 K/uL  COMPREHENSIVE METABOLIC PANEL     Status: Abnormal   Collection Time    06/07/13  2:01 AM      Result Value Range   Sodium 138  135 - 145 mEq/L   Potassium 3.7  3.5 - 5.1 mEq/L   Comment: HEMOLYSIS AT THIS LEVEL MAY AFFECT RESULT   Chloride 96  96 - 112 mEq/L   CO2 30  19 - 32 mEq/L  Glucose, Bld 120 (*) 70 - 99 mg/dL   BUN 9  6 - 23 mg/dL   Creatinine, Ser 6.96  0.50 - 1.10 mg/dL   Calcium 9.1  8.4 - 29.5 mg/dL   Total Protein 7.1  6.0 - 8.3 g/dL   Albumin 3.9  3.5 - 5.2 g/dL   AST 21  0 - 37 U/L   ALT 23  0 - 35 U/L   Alkaline Phosphatase 68  39 - 117 U/L   Total Bilirubin 0.6  0.3 - 1.2 mg/dL   GFR calc non Af Amer >90  >90 mL/min   GFR calc Af Amer >90  >90 mL/min   Comment:            The eGFR has been calculated     using the CKD EPI equation.     This calculation has not been     validated in all clinical     situations.     eGFR's persistently     <90 mL/min signify     possible Chronic Kidney Disease.  LIPASE, BLOOD     Status: None   Collection Time    06/07/13  2:01 AM      Result Value Range   Lipase 16  11 - 59 U/L  URINALYSIS, ROUTINE W REFLEX MICROSCOPIC     Status: Abnormal   Collection Time    06/07/13  3:36 AM      Result Value Range   Color, Urine YELLOW  YELLOW   APPearance CLOUDY (*) CLEAR   Specific Gravity, Urine 1.013  1.005 - 1.030   pH 6.0  5.0 - 8.0   Glucose, UA NEGATIVE  NEGATIVE mg/dL   Hgb urine dipstick NEGATIVE  NEGATIVE   Bilirubin Urine NEGATIVE  NEGATIVE   Ketones, ur NEGATIVE  NEGATIVE mg/dL   Protein, ur NEGATIVE  NEGATIVE mg/dL   Urobilinogen, UA 0.2  0.0 - 1.0 mg/dL   Nitrite NEGATIVE  NEGATIVE   Leukocytes, UA NEGATIVE  NEGATIVE   Comment: MICROSCOPIC NOT DONE ON URINES WITH NEGATIVE PROTEIN, BLOOD, LEUKOCYTES, NITRITE, OR  GLUCOSE <1000 mg/dL.  OCCULT BLOOD, POC DEVICE     Status: None   Collection Time    06/07/13  6:02 AM      Result Value Range   Fecal Occult Bld NEGATIVE  NEGATIVE    Radiological Exams on Admission: Ct Abdomen Pelvis W Contrast  06/07/2013   *RADIOLOGY REPORT*  Clinical Data: Abdominal pain.  Diarrhea.  Nausea and vomiting.  CT ABDOMEN AND PELVIS WITH CONTRAST  Technique:  Multidetector CT imaging of the abdomen and pelvis was performed following the standard protocol during bolus administration of intravenous contrast.  Contrast: OMNIPAQUE IOHEXOL 300 MG/ML  SOLN  Comparison: CT of the abdomen and pelvis 11/09/2012.  Findings:  Lung Bases: Unremarkable.  Abdomen/Pelvis:  Low attenuation throughout the hepatic parenchyma, suggestive of hepatic steatosis (difficult to say for certain on today's contrast enhanced study).  The appearance of the gallbladder, pancreas, spleen, bilateral adrenal glands and left kidney is unremarkable.  In the lower pole of the right kidney there is an 11 mm intermediate attenuation lesions which appears very similar in size to prior examinations dating back to 2007, favored to represent a proteinaceous cyst.  A trace volume of free fluid in the right pericolic gutter, and in the anatomic pelvis.  No larger volume of ascites.  No pneumoperitoneum.  No pathologic distension of small bowel. Numerous colonic diverticula are noted, particularly  in the region of the sigmoid [place], without definite surrounding inflammatory changes to suggest acute diverticulitis at this time.  No definite pathologic lymphadenopathy identified within the abdomen or pelvis. Mild atherosclerosis throughout the abdominal and pelvic vasculature, without definite aneurysm or dissection.  Status post hysterectomy.  Ovaries are not confidently identified and may be surgically absent or atrophic.  The urinary bladder is unremarkable in appearance.  Musculoskeletal: There are no aggressive appearing  lytic or blastic lesions noted in the visualized portions of the skeleton.  IMPRESSION: 1.  Trace volume of ascites in the pelvis and right pericolic gutter.  The exact source and significance of this ascites is uncertain. 2.  Colonic diverticulosis without definite findings to suggest acute diverticulitis at this time. 3.  Probable hepatic steatosis redemonstrated. 4.  Intermediate attenuation 11 mm lesion in the lower pole of the right kidney similar to numerous prior examinations, favored to represent a proteinaceous cyst. 5.  Atherosclerosis. 6.  Normal appendix.   Original Report Authenticated By: Trudie Reed, M.D.    Assessment/Plan Active Problems:   ANXIETY   HYPERTENSION   GERD   DIVERTICULOSIS, [place]   False positive HIV serology   Gastroenteritis  Nausea /vomiting/ diarrhea Patient may have underlying gastroenteritis, due to elevated white count I wonder start her on Cipro and Flagyl. We'll also send stool culture and stool for C. Difficile The start the patient on clear liquid diet Patient does have a history of diverticulosis CT scan does not show due to colitis in today's imaging studies  Mild ascitic fluid Patient has a mild ascitic fluid in the pelvis and right pericolic gutter. She does have a mild hepatic steatosis, has history of alcohol abuse in the past. Patient had hysterectomy and oophrectomy on one side in 1998 She would need to followup GI as outpatient Will need a repeat CAT scan in 3 months to assess the ascitic fluid She does not require paracentesis at this time  Hypertension Patient will be started on her home regimen Hydrochlorothiazide 25 mg by mouth daily  False positive HIV serology Patient was seen by ID in the clinic She had 3 false positive HIV tests with negative viral load and normal CD4 count She is supposed to follow rheumatology as outpatient  GERD Was start her on protonix. Maalox q 6 hrs.  DVT prophylaxis Lovenox        Code status: Presumed full code  Family discussion: Discussed with patient in detail   Time Spent on Admission: 55 min  Jager Koska S [facility] Pager: [phone] 06/07/2013, 8:03 AM

## 2013-06-08 DIAGNOSIS — M545 Low back pain, unspecified: Secondary | ICD-10-CM

## 2013-06-08 DIAGNOSIS — I10 Essential (primary) hypertension: Secondary | ICD-10-CM

## 2013-06-08 LAB — CBC
HCT: 38.6 % (ref 36.0–46.0)
Platelets: 223 10*3/uL (ref 150–400)
RBC: 4.19 MIL/uL (ref 3.87–5.11)
RDW: 12.8 % (ref 11.5–15.5)
WBC: 8.2 10*3/uL (ref 4.0–10.5)

## 2013-06-08 LAB — COMPREHENSIVE METABOLIC PANEL
AST: 15 U/L (ref 0–37)
Albumin: 3.3 g/dL — ABNORMAL LOW (ref 3.5–5.2)
Alkaline Phosphatase: 58 U/L (ref 39–117)
Chloride: 100 mEq/L (ref 96–112)
Potassium: 3.3 mEq/L — ABNORMAL LOW (ref 3.5–5.1)
Total Bilirubin: 0.9 mg/dL (ref 0.3–1.2)

## 2013-06-08 MED ORDER — POTASSIUM CHLORIDE CRYS ER 20 MEQ PO TBCR
40.0000 meq | EXTENDED_RELEASE_TABLET | Freq: Once | ORAL | Status: AC
  Administered 2013-06-08: 40 meq via ORAL
  Filled 2013-06-08: qty 2

## 2013-06-08 MED ORDER — HYDROMORPHONE HCL PF 1 MG/ML IJ SOLN
0.5000 mg | INTRAMUSCULAR | Status: DC | PRN
  Administered 2013-06-08 – 2013-06-09 (×7): 0.5 mg via INTRAVENOUS
  Filled 2013-06-08 (×7): qty 1

## 2013-06-08 NOTE — Progress Notes (Signed)
[facility] PROGRESS NOTE  Maria [name] NWG:[id number] DOB: [birth year] DOA: 06/07/2013 PCP: Oliver Barre, MD  Assessment/Plan: Active Problems:   ANXIETY   HYPERTENSION   GERD   DIVERTICULOSIS, [place]   False positive HIV serology   Gastroenteritis    1. Acute Gastroenteritis: Patient presented with 3 days of diarrhea, as well as 2 days of vomiting, without clustering of cases, recent antibiotic therapy or recent exotic travel. Wcc was elevated at 20.6. Abdominal/pelvic CT scan was devoid of acute findings, apart from trace volume of ascites in the pelvis and right pericolic gutter, of uncertain clinical significance. Managing with bowel rest, iv fluids, antiemetics, as well as empiric CiprofloxacinFlagyl, now day #2. Today, patient is asymptomatic. Have advanced diet. C. Difficile PCR is pending, as she has had no diarrhea since admission.  2. Hypertension: Controlled on pre-admission antihypertensives.  3. False positive HIV serology: Patient was seen by ID in the clinic and had 3 false positive HIV tests with negative viral load and normal CD4 count. This is of historical importance. She outpatient urology follow up.  4. GERD: Not problematic.  5. Chronic pain/Fibromyalgia: Stable/Not problematic.    Code Status: Full Code.  Family Communication:  Disposition Plan: To be determined.    Brief narrative: 57 year old female with history of HTN, GERD, diverticulosis, hepatic steatosis, allergic rhinitis, anxiety, depression,DJD, s/p cervical disk surgery, chronic low back pain/radiculitis, fibromyalgia, who came to the ED for colicky abdominal pain, which started in the night of 06/06/13 after patient had frozen vegetables for dinner. She had 2 episodes of vomiting in AM of 06/07/13. She has been having diarrhea over the prior 3 days and she has noticed black colored stools. She denies any recent antibiotic use. Patient has a history of false-positive HIV, and was recently seen by ID  in the clinic. She supposed to follow up with rheumatology as outpatient. CT scan of the abdomen and pelvis showed mild ascitic fluid in the right pelvis and paracolic cutter. Patient had long-standing history of alcohol abuse, but she quit drinking many years ago. Patient denies chest pain, shortness of breath, cough or fever. Admitted for further management.    Consultants:  N/A.   Procedures:  CT Abdomen/Pelvis.   Antibiotics:  Ciprofloxacin 06/07/13>>>  Flagyl 06/07/13>>>  HPI/Subjective: Asymptomatic overnight.   Objective: Vital signs in last 24 hours: Temp:  [98.2 F (36.8 C)-98.6 F (37 C)] 98.6 F (37 C) (06/19 0521) Pulse Rate:  [73-79] 79 (06/19 0521) Resp:  [18-20] 18 (06/19 0521) BP: (121-155)/(62-83) 121/66 mmHg (06/19 0521) SpO2:  [91 %-96 %] 92 % (06/19 0521) Weight:  [144.743 kg (319 lb 1.6 oz)] 144.743 kg (319 lb 1.6 oz) (06/18 1036) Weight change: -1.769 kg (-3 lb 14.4 oz) Last BM Date: 06/06/13  Intake/Output from previous day: 06/18 0701 - 06/19 0700 In: 240 [P.O.:240] Out: 500 [Urine:500]     Physical Exam: General: Comfortable, alert, communicative, fully oriented, not short of breath at rest.  HEENT:  No clinical pallor, no jaundice, no conjunctival injection or discharge. Hydration status is satisfactory. NECK:  Supple, JVP not seen, no carotid bruits, no palpable lymphadenopathy, no palpable goiter. CHEST:  Clinically clear to auscultation, no wheezes, no crackles. HEART:  Sounds 1 and 2 heard, normal, regular, no murmurs. ABDOMEN:  Morbidly obese, soft, generalized discomfort, max in central abdomen, no palpable organomegaly, no palpable masses, normal bowel sounds. GENITALIA:  Not examined. LOWER EXTREMITIES:  No pitting edema, palpable peripheral pulses. MUSCULOSKELETAL SYSTEM:  Unremarkable. CENTRAL NERVOUS SYSTEM:  No focal neurologic deficit on gross examination.  Lab Results:  Recent Labs  06/07/13 0201 06/08/13 0618  WBC 20.6*  8.2  HGB 14.9 12.7  HCT 44.1 38.6  PLT 266 223    Recent Labs  06/07/13 0201 06/08/13 0618  NA 138 140  K 3.7 3.3*  CL 96 100  CO2 30 32  GLUCOSE 120* 106*  BUN 9 6  CREATININE 0.75 0.82  CALCIUM 9.1 8.4   No results found for this or any previous visit (from the past 240 hour(s)).   Studies/Results: Ct Abdomen Pelvis W Contrast  06/07/2013   *RADIOLOGY REPORT*  Clinical Data: Abdominal pain.  Diarrhea.  Nausea and vomiting.  CT ABDOMEN AND PELVIS WITH CONTRAST  Technique:  Multidetector CT imaging of the abdomen and pelvis was performed following the standard protocol during bolus administration of intravenous contrast.  Contrast: OMNIPAQUE IOHEXOL 300 MG/ML  SOLN  Comparison: CT of the abdomen and pelvis 11/09/2012.  Findings:  Lung Bases: Unremarkable.  Abdomen/Pelvis:  Low attenuation throughout the hepatic parenchyma, suggestive of hepatic steatosis (difficult to say for certain on today's contrast enhanced study).  The appearance of the gallbladder, pancreas, spleen, bilateral adrenal glands and left kidney is unremarkable.  In the lower pole of the right kidney there is an 11 mm intermediate attenuation lesions which appears very similar in size to prior examinations dating back to 2007, favored to represent a proteinaceous cyst.  A trace volume of free fluid in the right pericolic gutter, and in the anatomic pelvis.  No larger volume of ascites.  No pneumoperitoneum.  No pathologic distension of small bowel. Numerous colonic diverticula are noted, particularly in the region of the sigmoid [place], without definite surrounding inflammatory changes to suggest acute diverticulitis at this time.  No definite pathologic lymphadenopathy identified within the abdomen or pelvis. Mild atherosclerosis throughout the abdominal and pelvic vasculature, without definite aneurysm or dissection.  Status post hysterectomy.  Ovaries are not confidently identified and may be surgically absent or  atrophic.  The urinary bladder is unremarkable in appearance.  Musculoskeletal: There are no aggressive appearing lytic or blastic lesions noted in the visualized portions of the skeleton.  IMPRESSION: 1.  Trace volume of ascites in the pelvis and right pericolic gutter.  The exact source and significance of this ascites is uncertain. 2.  Colonic diverticulosis without definite findings to suggest acute diverticulitis at this time. 3.  Probable hepatic steatosis redemonstrated. 4.  Intermediate attenuation 11 mm lesion in the lower pole of the right kidney similar to numerous prior examinations, favored to represent a proteinaceous cyst. 5.  Atherosclerosis. 6.  Normal appendix.   Original Report Authenticated By: Trudie Reed, M.D.    Medications: Scheduled Meds: . ciprofloxacin  400 mg Intravenous Q12H  . citalopram  10 mg Oral Daily  . enoxaparin (LOVENOX) injection  70 mg Subcutaneous Q24H  . hydrochlorothiazide  25 mg Oral Daily  . metronidazole  500 mg Intravenous Q8H  . pantoprazole (PROTONIX) IV  40 mg Intravenous Q24H   Continuous Infusions: . sodium chloride 75 mL/hr at 06/07/13 1149   PRN Meds:.albuterol, alum & mag hydroxide-simeth, clonazePAM, oxyCODONE    LOS: 1 day   Khara Renaud,CHRISTOPHER  [facility] Pager [phone]. If 8PM-8AM, please contact night-coverage at [email or web address], password [facility] 06/08/2013, 7:36 AM  LOS: 1 day

## 2013-06-09 DIAGNOSIS — G8929 Other chronic pain: Secondary | ICD-10-CM

## 2013-06-09 DIAGNOSIS — F329 Major depressive disorder, single episode, unspecified: Secondary | ICD-10-CM

## 2013-06-09 LAB — CBC
HCT: 38.4 % (ref 36.0–46.0)
MCH: 29.6 pg (ref 26.0–34.0)
MCHC: 32 g/dL (ref 30.0–36.0)
MCV: 92.5 fL (ref 78.0–100.0)
RDW: 12.8 % (ref 11.5–15.5)

## 2013-06-09 LAB — BASIC METABOLIC PANEL
BUN: 7 mg/dL (ref 6–23)
Chloride: 101 mEq/L (ref 96–112)
Creatinine, Ser: 0.83 mg/dL (ref 0.50–1.10)
GFR calc Af Amer: 90 mL/min — ABNORMAL LOW (ref >90)
GFR calc non Af Amer: 77 mL/min — ABNORMAL LOW (ref >90)

## 2013-06-09 MED ORDER — METRONIDAZOLE 500 MG PO TABS: 500.0000 mg | ORAL_TABLET | Freq: Three times a day (TID) | ORAL | Status: DC

## 2013-06-09 MED ORDER — CIPROFLOXACIN HCL 500 MG PO TABS: 500.0000 mg | ORAL_TABLET | Freq: Two times a day (BID) | ORAL | Status: DC

## 2013-06-09 MED ORDER — POTASSIUM CHLORIDE CRYS ER 20 MEQ PO TBCR
40.0000 meq | EXTENDED_RELEASE_TABLET | Freq: Once | ORAL | Status: AC
  Administered 2013-06-09: 40 meq via ORAL
  Filled 2013-06-09: qty 2

## 2013-06-09 NOTE — Discharge Summary (Signed)
Physician Discharge Summary  Maria [name] HYQ:[id number] DOB: 27-Jan-1956 DOA: 06/07/2013  PCP: Oliver Barre, MD  Admit date: 06/07/2013 Discharge date: 06/09/2013  Time spent: 40 minutes  Recommendations for Outpatient Follow-up:  1. Follow up with PMD. 2. Follow up with Dr Sammuel Hines , on 07/03/13. Appointment is scheduled for 1.45 PM.   Discharge Diagnoses:  Active Problems:   ANXIETY   HYPERTENSION   GERD   DIVERTICULOSIS, [place]   False positive HIV serology   Gastroenteritis   Discharge Condition: Satisfactory.  Diet recommendation: Heart-Healthy.  Filed Weights   06/07/13 0133 06/07/13 1036  Weight: 146.512 kg (323 lb) 144.743 kg (319 lb 1.6 oz)    History of present illness:  57 year old female with history of HTN, GERD, diverticulosis, hepatic steatosis, allergic rhinitis, anxiety, depression,DJD, s/p cervical disk surgery, chronic low back pain/radiculitis, fibromyalgia, who came to the ED for colicky abdominal pain, which started in the night of 06/06/13 after patient had frozen vegetables for dinner. She had 2 episodes of vomiting in AM of 06/07/13. She has been having diarrhea over the prior 3 days and she has noticed black colored stools. She denies any recent antibiotic use. Patient has a history of false-positive HIV, and was recently seen by ID in the clinic. She supposed to follow up with rheumatology as outpatient. CT scan of the abdomen and pelvis showed mild ascitic fluid in the right pelvis and paracolic cutter. Patient had long-standing history of alcohol abuse, but she quit drinking many years ago. Patient denies chest pain, shortness of breath, cough or fever. Admitted for further management.    Hospital Course:  1. Acute Gastroenteritis: Patient presented with 3 days of diarrhea, as well as 2 days of vomiting, without clustering of cases, recent antibiotic therapy or recent exotic travel. Wcc was elevated at 20.6. Abdominal/pelvic CT scan was devoid of  acute findings, apart from trace volume of ascites in the pelvis and right pericolic gutter, of uncertain clinical significance. Managed with bowel rest, iv fluids, antiemetics, as well as empiric Ciprofloxacin/Flagyl. As of 06/08/13, patient was asymptomatic, tolerated advanced diet. C. Difficile PCR was negative, and patient had no recurrence of diarrhea, during hospitalization. As of 06/08/13, wcc had normalized at 8.2. Antibiotics will be completed on 06/13/13.  2. Hypertension: Controlled on pre-admission antihypertensives.  3. False positive HIV serology: Patient was seen by ID in the clinic and had 3 false positive HIV tests with negative viral load and normal CD4 count. This is of historical importance. She outpatient rheumatology  follow up.  4. GERD: Not problematic.  5. Chronic pain/Fibromyalgia: Stable/Not problematic.  6. Right Renal Lesion: An incidental finding on abdominal/palevic CT scan, was Intermediate attenuation 11 mm lesion in the lower pole of the right kidney similar to numerous prior examinations, favored to represent a proteinaceous cyst. Despite my reassurances, patient is very concerned and worried about this. Have therefore, arranged urology follow up, with Dr Sammuel Hines , on 07/03/13. Appointment is scheduled for 1.45 PM.     Procedures:  See Below.   Consultations:  N/A.   Discharge Exam: Filed Vitals:   06/08/13 0521 06/08/13 1346 06/08/13 2100 06/09/13 0536  BP: 121/66 129/71 123/65 117/54  Pulse: 79 72    Temp: 98.6 F (37 C) 98.8 F (37.1 C)  98.2 F (36.8 C)  TempSrc: Oral Oral Oral Oral  Resp: 18 20 20 18   Height:      Weight:      SpO2: 92% 94% 95% 91%  General: Comfortable, alert, communicative, fully oriented, not short of breath at rest.  HEENT: No clinical pallor, no jaundice, no conjunctival injection or discharge. Hydration status is satisfactory.  NECK: Supple, JVP not seen, no carotid bruits, no palpable lymphadenopathy, no  palpable goiter.  CHEST: Clinically clear to auscultation, no wheezes, no crackles.  HEART: Sounds 1 and 2 heard, normal, regular, no murmurs.  ABDOMEN: Morbidly obese, soft, non-tender, no palpable organomegaly, no palpable masses, normal bowel sounds.  GENITALIA: Not examined.  LOWER EXTREMITIES: No pitting edema, palpable peripheral pulses.  MUSCULOSKELETAL SYSTEM: Unremarkable.  CENTRAL NERVOUS SYSTEM: No focal neurologic deficit on gross examination.  Discharge Instructions      Discharge Orders   Future Orders Complete By Expires     Diet - low sodium heart healthy  As directed     Increase activity slowly  As directed         Medication List    TAKE these medications       albuterol 108 (90 BASE) MCG/ACT inhaler  Commonly known as:  PROVENTIL HFA;VENTOLIN HFA  Inhale 2 puffs into the lungs every 4 (four) hours as needed for wheezing (cough, shortness of breath or wheezing.).     ciprofloxacin 500 MG tablet  Commonly known as:  CIPRO  Take 1 tablet (500 mg total) by mouth 2 (two) times daily.     citalopram 10 MG tablet  Commonly known as:  CELEXA  Take 1 tablet (10 mg total) by mouth daily.     clonazePAM 0.5 MG tablet  Commonly known as:  KLONOPIN  Take 0.5 mg by mouth 2 (two) times daily as needed for anxiety.     hydrochlorothiazide 25 MG tablet  Commonly known as:  HYDRODIURIL  Take 25 mg by mouth daily.     loperamide 2 MG capsule  Commonly known as:  IMODIUM  Take 2 mg by mouth 4 (four) times daily as needed for diarrhea or loose stools (for upset stomach).     metroNIDAZOLE 500 MG tablet  Commonly known as:  FLAGYL  Take 1 tablet (500 mg total) by mouth 3 (three) times daily.     oxyCODONE-acetaminophen 5-325 MG per tablet  Commonly known as:  ROXICET  Take 1 tablet by mouth every 6 (six) hours as needed for pain.     tiZANidine 4 MG tablet  Commonly known as:  ZANAFLEX  Take 4 mg by mouth every 6 (six) hours as needed (for pain). Muscle spasms        Allergies  Allergen Reactions  . Amoxicillin-Pot Clavulanate Nausea And Vomiting  . Codeine Other (See Comments)    hallucinations  . Gluten Meal   . Lactose Intolerance (Gi)   . Morphine And Related Other (See Comments)    headaches  . Vicodin (Hydrocodone-Acetaminophen) Other (See Comments)    hallucinations  . Latex Rash   Follow-up Information   Schedule an appointment as soon as possible for a visit with Oliver Barre, MD.   Contact information:   [address] [place] [place] [address] [phone]       Please follow up. (Follow up with Dr Sammuel Hines, urologist at [facility] @ [address], [address]. Tell: [phone]. An appointment has been scheduled for Monday 07/03/13 at 1.45 PM. Office will contact you ASAP., for details. )        The results of significant diagnostics from this hospitalization (including imaging, microbiology, ancillary and laboratory) are listed below for reference.  Significant Diagnostic Studies: Mr Lumbar Spine Wo Contrast  05/13/2013   *RADIOLOGY REPORT*  Clinical Data: Chronic low back pain with difficulty walking. Symptoms worsening.  Numbness on the right.  MRI LUMBAR SPINE WITHOUT CONTRAST  Technique:  Multiplanar and multiecho pulse sequences of the lumbar spine were obtained without intravenous contrast.  Comparison: MRI 01/13/2009  Findings: There is no abnormality at L1-2 or above.  L2-3:  Mild bulging of the disc.  Mild facet and ligamentous hypertrophy.  Mild stenosis of the lateral recesses left more than right.  L3-4:  Bilateral facet arthropathy with anterolisthesis of 2 mm. Bulging of the disc.  Mild stenosis of the canal and lateral recesses bilaterally.  L4-5:  Circumferential bulging of the disc.  Facet degeneration hypertrophy left worse than right.  Lateral recess narrowing left worse than right.  L5-S1:  Some transitional features.  No disc pathology.  Wide patency of the canal and foramina.  Since the  previous study, the findings appear quite similar.  IMPRESSION: L2-3:  Disc bulge.  Facet degeneration.  Mild narrowing of the lateral recesses without gross neural compression.  L3-4:  Bilateral facet arthropathy with some edematous change.  2 mm of anterolisthesis.  Bulging of the disc.  Spinal stenosis that could cause neural compression on either or both sides.  This could worsen with standing or flexion.  L4-5:  Disc bulge.  Bilateral facet and ligamentous hypertrophy. Narrowing of the lateral recesses bilaterally that could be symptomatic.   Original Report Authenticated By: Paulina Fusi, M.D.   Ct Abdomen Pelvis W Contrast  06/07/2013   *RADIOLOGY REPORT*  Clinical Data: Abdominal pain.  Diarrhea.  Nausea and vomiting.  CT ABDOMEN AND PELVIS WITH CONTRAST  Technique:  Multidetector CT imaging of the abdomen and pelvis was performed following the standard protocol during bolus administration of intravenous contrast.  Contrast: OMNIPAQUE IOHEXOL 300 MG/ML  SOLN  Comparison: CT of the abdomen and pelvis 11/09/2012.  Findings:  Lung Bases: Unremarkable.  Abdomen/Pelvis:  Low attenuation throughout the hepatic parenchyma, suggestive of hepatic steatosis (difficult to say for certain on today's contrast enhanced study).  The appearance of the gallbladder, pancreas, spleen, bilateral adrenal glands and left kidney is unremarkable.  In the lower pole of the right kidney there is an 11 mm intermediate attenuation lesions which appears very similar in size to prior examinations dating back to 2007, favored to represent a proteinaceous cyst.  A trace volume of free fluid in the right pericolic gutter, and in the anatomic pelvis.  No larger volume of ascites.  No pneumoperitoneum.  No pathologic distension of small bowel. Numerous colonic diverticula are noted, particularly in the region of the sigmoid [place], without definite surrounding inflammatory changes to suggest acute diverticulitis at this time.  No  definite pathologic lymphadenopathy identified within the abdomen or pelvis. Mild atherosclerosis throughout the abdominal and pelvic vasculature, without definite aneurysm or dissection.  Status post hysterectomy.  Ovaries are not confidently identified and may be surgically absent or atrophic.  The urinary bladder is unremarkable in appearance.  Musculoskeletal: There are no aggressive appearing lytic or blastic lesions noted in the visualized portions of the skeleton.  IMPRESSION: 1.  Trace volume of ascites in the pelvis and right pericolic gutter.  The exact source and significance of this ascites is uncertain. 2.  Colonic diverticulosis without definite findings to suggest acute diverticulitis at this time. 3.  Probable hepatic steatosis redemonstrated. 4.  Intermediate attenuation 11 mm lesion in the lower pole of  the right kidney similar to numerous prior examinations, favored to represent a proteinaceous cyst. 5.  Atherosclerosis. 6.  Normal appendix.   Original Report Authenticated By: Trudie Reed, M.D.    Microbiology: Recent Results (from the past 240 hour(s))  CLOSTRIDIUM DIFFICILE BY PCR     Status: None   Collection Time    06/08/13  3:39 PM      Result Value Range Status   C difficile by pcr NEGATIVE  NEGATIVE Final     Labs: Basic Metabolic Panel:  Recent Labs Lab 06/07/13 0201 06/08/13 0618 06/09/13 0540  NA 138 140 140  K 3.7 3.3* 3.1*  CL 96 100 101  CO2 30 32 34*  GLUCOSE 120* 106* 95  BUN 9 6 7   CREATININE 0.75 0.82 0.83  CALCIUM 9.1 8.4 8.3*   Liver Function Tests:  Recent Labs Lab 06/07/13 0201 06/08/13 0618  AST 21 15  ALT 23 18  ALKPHOS 68 58  BILITOT 0.6 0.9  PROT 7.1 6.1  ALBUMIN 3.9 3.3*    Recent Labs Lab 06/07/13 0201  LIPASE 16   No results found for this basename: AMMONIA,  in the last 168 hours CBC:  Recent Labs Lab 06/07/13 0201 06/08/13 0618 06/09/13 0540  WBC 20.6* 8.2 8.0  NEUTROABS 15.4*  --   --   HGB 14.9 12.7 12.3   HCT 44.1 38.6 38.4  MCV 90.4 92.1 92.5  PLT 266 223 204   Cardiac Enzymes: No results found for this basename: CKTOTAL, CKMB, CKMBINDEX, TROPONINI,  in the last 168 hours BNP: BNP (last 3 results)  Recent Labs  06/28/12 1920  PROBNP 120.7   CBG: No results found for this basename: GLUCAP,  in the last 168 hours     Signed:  Dazia Lippold,CHRISTOPHER  [facility] 06/09/2013, 11:22 AM

## 2013-06-12 LAB — STOOL CULTURE: Special Requests: NORMAL

## 2013-06-13 ENCOUNTER — Ambulatory Visit (INDEPENDENT_AMBULATORY_CARE_PROVIDER_SITE_OTHER): Payer: Self-pay | Admitting: Internal Medicine

## 2013-06-13 ENCOUNTER — Encounter: Payer: Self-pay | Admitting: Internal Medicine

## 2013-06-13 ENCOUNTER — Other Ambulatory Visit (INDEPENDENT_AMBULATORY_CARE_PROVIDER_SITE_OTHER): Payer: Self-pay

## 2013-06-13 VITALS — BP 132/82 | HR 84 | Temp 98.3°F | Ht 66.0 in | Wt 325.5 lb

## 2013-06-13 DIAGNOSIS — R1310 Dysphagia, unspecified: Secondary | ICD-10-CM

## 2013-06-13 DIAGNOSIS — I70209 Unspecified atherosclerosis of native arteries of extremities, unspecified extremity: Secondary | ICD-10-CM

## 2013-06-13 DIAGNOSIS — I10 Essential (primary) hypertension: Secondary | ICD-10-CM

## 2013-06-13 DIAGNOSIS — R7309 Other abnormal glucose: Secondary | ICD-10-CM

## 2013-06-13 DIAGNOSIS — R7302 Impaired glucose tolerance (oral): Secondary | ICD-10-CM

## 2013-06-13 HISTORY — DX: Unspecified atherosclerosis of native arteries of extremities, unspecified extremity: I70.209

## 2013-06-13 LAB — LIPID PANEL: Cholesterol: 169 mg/dL (ref 0–200)

## 2013-06-13 LAB — LDL CHOLESTEROL, DIRECT: Direct LDL: 96.1 mg/dL

## 2013-06-13 MED ORDER — OXYCODONE-ACETAMINOPHEN 5-325 MG PO TABS: 1.0000 | ORAL_TABLET | Freq: Four times a day (QID) | ORAL | Status: DC | PRN

## 2013-06-13 NOTE — Progress Notes (Signed)
Subjective:    Patient ID: Maria [name], female    DOB: 1956-05-16, 57 y.o.   MRN: [id number]  HPI Here to f/u after recent hospn with severe epigastric/lower CP sqeezing pain, also with intermittent n/v, and dysphagia, down 25 lbs since oct 2013, hx of esoph stricture. Denies worsening reflux, other abd pain, bowel change or blood.  Recent CT also with noted atherosclerosis, pt asks for discussion.  Chronic pain stable, Pt continues to have recurring LBP without change in severity, bowel or bladder change, fever, wt loss,  worsening LE pain/numbness/weakness, gait change or falls, for pain med refill. Today.  Pt denies other chest pain, increased sob or doe, wheezing, orthopnea, PND, increased LE swelling, palpitations, dizziness or syncope.  Pt denies fever, wt loss, night sweats, loss of appetite, or other constitutional symptoms except for the above Past Medical History  Diagnosis Date  . Acute lymphadenitis 08/13/2009  . Acute pharyngitis 02/10/2010  . ALLERGIC RHINITIS 08/10/2007  . ANXIETY 12/06/2007  . BACK PAIN 12/06/2007  . CHEST PAIN 08/13/2009  . DEPRESSION 12/06/2007  . DIVERTICULOSIS, [place] 12/06/2007  . GERD 12/06/2007  . HYPERTENSION 12/06/2007  . LOW BACK PAIN 12/06/2007  . MRSA 02/07/2009  . Pain in joint, multiple sites 04/21/2010  . PARONYCHIA, FINGER 08/13/2009  . RASH-NONVESICULAR 05/09/2008  . SHOULDER PAIN, LEFT 05/09/2008  . SINUSITIS- ACUTE-NOS 05/09/2008  . THORACIC/LUMBOSACRAL NEURITIS/RADICULITIS UNSPEC 12/28/2008  . Lumbar disc disease 03/09/2012  . Cervical disc disease 03/09/2012  . Chronic pain 03/09/2012  . Impaired glucose tolerance 03/13/2012  . Cellulitis   . Atherosclerotic peripheral vascular disease 06/13/2013    Aorta on CT June 2014   Past Surgical History  Procedure Laterality Date  . Abdominal hysterectomy    . S/p ovary cyst    . S/p right knee arthroscopy      Dr. Dion Saucier ortho  . Cervical disc surgery      reports that she quit smoking about 4  years ago. She has never used smokeless tobacco. She reports that she does not drink alcohol or use illicit drugs. family history includes Asthma in her daughter and sister; Cancer in her maternal uncle and other; Diabetes in her mother; Heart disease in her mother; and Hypertension in her mother. Allergies  Allergen Reactions  . Amoxicillin-Pot Clavulanate Nausea And Vomiting  . Codeine Other (See Comments)    hallucinations  . Gluten Meal   . Lactose Intolerance (Gi)   . Morphine And Related Other (See Comments)    headaches  . Vicodin (Hydrocodone-Acetaminophen) Other (See Comments)    hallucinations  . Latex Rash   Current Outpatient Prescriptions on File Prior to Visit  Medication Sig Dispense Refill  . albuterol (PROVENTIL HFA;VENTOLIN HFA) 108 (90 BASE) MCG/ACT inhaler Inhale 2 puffs into the lungs every 4 (four) hours as needed for wheezing (cough, shortness of breath or wheezing.).  1 Inhaler  5  . ciprofloxacin (CIPRO) 500 MG tablet Take 1 tablet (500 mg total) by mouth 2 (two) times daily.  8 tablet  0  . citalopram (CELEXA) 10 MG tablet Take 1 tablet (10 mg total) by mouth daily.  90 tablet  3  . clonazePAM (KLONOPIN) 0.5 MG tablet Take 0.5 mg by mouth 2 (two) times daily as needed for anxiety.      . hydrochlorothiazide (HYDRODIURIL) 25 MG tablet Take 25 mg by mouth daily.      Marland Kitchen loperamide (IMODIUM) 2 MG capsule Take 2 mg by mouth 4 (four) times daily as  needed for diarrhea or loose stools (for upset stomach).       . metroNIDAZOLE (FLAGYL) 500 MG tablet Take 1 tablet (500 mg total) by mouth 3 (three) times daily.  12 tablet  0  . tiZANidine (ZANAFLEX) 4 MG tablet Take 4 mg by mouth every 6 (six) hours as needed (for pain). Muscle spasms      . [DISCONTINUED] DULoxetine (CYMBALTA) 60 MG capsule Take 60 mg by mouth daily.        . [DISCONTINUED] fexofenadine (ALLEGRA) 180 MG tablet Take 180 mg by mouth daily.        . [DISCONTINUED] furosemide (LASIX) 40 MG tablet Take 40 mg  by mouth daily.         No current facility-administered medications on file prior to visit.   Review of Systems  Constitutional: Negative for unexpected weight change, or unusual diaphoresis  HENT: Negative for tinnitus.   Eyes: Negative for photophobia and visual disturbance.  Respiratory: Negative for choking and stridor.   Gastrointestinal: Negative for vomiting and blood in stool.  Genitourinary: Negative for hematuria and decreased urine volume.  Musculoskeletal: Negative for acute joint swelling Skin: Negative for color change and wound.  Neurological: Negative for tremors and numbness other than noted  Psychiatric/Behavioral: Negative for decreased concentration or  hyperactivity.       Objective:   Physical Exam BP 132/82  Pulse 84  Temp(Src) 98.3 F (36.8 C) (Oral)  Ht 5\' 6"  (1.676 m)  Wt 325 lb 8 oz (147.646 kg)  BMI 52.56 kg/m2  SpO2 95% VS noted,  Constitutional: Pt appears well-developed and well-nourished.  HENT: Head: NCAT.  Right Ear: External ear normal.  Left Ear: External ear normal.  Eyes: Conjunctivae and EOM are normal. Pupils are equal, round, and reactive to light.  Neck: Normal range of motion. Neck supple.  Cardiovascular: Normal rate and regular rhythm.   Pulmonary/Chest: Effort normal and breath sounds normal.  Abd:  Soft, NT, non-distended, + BS Neurological: Pt is alert. Not confused  Skin: Skin is warm. No erythema.  Psychiatric: Pt behavior is normal. Thought content normal.     Assessment & Plan:

## 2013-06-13 NOTE — Assessment & Plan Note (Addendum)
For f/u lipids, for cont'd efforts at risk factor mod

## 2013-06-13 NOTE — Patient Instructions (Signed)
Please continue all other medications as before, and refills have been done if requested - the pain medication You will be contacted regarding the referral for: GI for the swallowing issue Please go to the LAB in the Basement (turn left off the elevator) for the tests to be done today - just the cholesterol today  Please remember to sign up for My Chart if you have not done so, as this will be important to you in the future with finding out test results, communicating by private email, and scheduling acute appointments online when needed.  Please return in 6 months, or sooner if needed

## 2013-06-14 NOTE — Assessment & Plan Note (Signed)
With recent wt loss - for refer GI, suspect recent pain GI related - ? esoph spasm

## 2013-06-14 NOTE — Assessment & Plan Note (Signed)
stable overall by history and exam, recent data reviewed with pt, and pt to continue medical treatment as before,  to f/u any worsening symptoms or concerns BP Readings from Last 3 Encounters:  06/13/13 132/82  06/09/13 156/80  06/02/13 130/83

## 2013-06-14 NOTE — Assessment & Plan Note (Signed)
stable overall by history and exam, recent data reviewed with pt, and pt to continue medical treatment as before,  to f/u any worsening symptoms or concerns Lab Results  Component Value Date   HGBA1C  Value: 5.4 (NOTE) The ADA recommends the following therapeutic goal for glycemic control related to Hgb A1c measurement: Goal of therapy: <6.5 Hgb A1c  Reference: American Diabetes Association: Clinical Practice Recommendations 2010, Diabetes Care, 2010, 33: (Suppl  1). 05/30/2009    

## 2013-06-16 ENCOUNTER — Encounter: Payer: Self-pay | Admitting: Gastroenterology

## 2013-07-10 ENCOUNTER — Telehealth: Payer: Self-pay

## 2013-07-10 ENCOUNTER — Other Ambulatory Visit: Payer: Self-pay | Admitting: Internal Medicine

## 2013-07-10 MED ORDER — ALPRAZOLAM 0.5 MG PO TABS: 0.5000 mg | ORAL_TABLET | Freq: Two times a day (BID) | ORAL | Status: DC | PRN

## 2013-07-10 NOTE — Telephone Encounter (Signed)
g

## 2013-07-10 NOTE — Telephone Encounter (Signed)
Please sent rx to [facility]

## 2013-07-10 NOTE — Telephone Encounter (Signed)
The patient would like to go back on xanax and stop clonazepam.  She stated clonazepam causes her to dream and walk in her sleep.  Please advise call back number is [phone]

## 2013-07-10 NOTE — Telephone Encounter (Signed)
Done hardcopy to robin  

## 2013-07-11 NOTE — Telephone Encounter (Signed)
Faxed hardcopy to [facility]. [place] and called the patient informed rx done.

## 2013-07-20 ENCOUNTER — Other Ambulatory Visit: Payer: Self-pay

## 2013-07-20 MED ORDER — OXYCODONE-ACETAMINOPHEN 5-325 MG PO TABS: 1.0000 | ORAL_TABLET | Freq: Four times a day (QID) | ORAL | Status: DC | PRN

## 2013-07-20 NOTE — Telephone Encounter (Signed)
Done hardcopy to robin  

## 2013-07-21 ENCOUNTER — Ambulatory Visit: Payer: Self-pay | Admitting: Gastroenterology

## 2013-07-21 NOTE — Telephone Encounter (Signed)
Called the patient informed to pickup hardcopy at the front desk. 

## 2013-08-15 ENCOUNTER — Encounter: Payer: Self-pay | Admitting: Internal Medicine

## 2013-08-15 ENCOUNTER — Ambulatory Visit (INDEPENDENT_AMBULATORY_CARE_PROVIDER_SITE_OTHER): Payer: Self-pay | Admitting: Internal Medicine

## 2013-08-15 VITALS — BP 120/72 | HR 94 | Temp 97.8°F | Ht 66.0 in | Wt 328.2 lb

## 2013-08-15 DIAGNOSIS — I10 Essential (primary) hypertension: Secondary | ICD-10-CM

## 2013-08-15 DIAGNOSIS — R42 Dizziness and giddiness: Secondary | ICD-10-CM

## 2013-08-15 DIAGNOSIS — M542 Cervicalgia: Secondary | ICD-10-CM

## 2013-08-15 DIAGNOSIS — G8929 Other chronic pain: Secondary | ICD-10-CM

## 2013-08-15 MED ORDER — CYCLOBENZAPRINE HCL 5 MG PO TABS: 5.0000 mg | ORAL_TABLET | Freq: Three times a day (TID) | ORAL | Status: DC | PRN

## 2013-08-15 MED ORDER — OXYCODONE HCL 5 MG PO TABS: ORAL_TABLET | ORAL | Status: DC

## 2013-08-15 MED ORDER — MECLIZINE HCL 12.5 MG PO TABS: 12.5000 mg | ORAL_TABLET | Freq: Three times a day (TID) | ORAL | Status: DC | PRN

## 2013-08-15 MED ORDER — MECLIZINE HCL 12.5 MG PO TABS
12.5000 mg | ORAL_TABLET | Freq: Three times a day (TID) | ORAL | Status: DC | PRN
Start: 2013-08-15 — End: 2013-08-15

## 2013-08-15 NOTE — Assessment & Plan Note (Signed)
stable overall by history and exam, recent data reviewed with pt, and pt to continue medical treatment as before,  to f/u any worsening symptoms or concerns BP Readings from Last 3 Encounters:  08/15/13 120/72  06/13/13 132/82  06/09/13 156/80

## 2013-08-15 NOTE — Assessment & Plan Note (Addendum)
ECG reviewed as per emr, for meclizine prn,  to f/u any worsening symptoms or concerns

## 2013-08-15 NOTE — Assessment & Plan Note (Signed)
Also for flexeril tid prn,  to f/u any worsening symptoms or concerns

## 2013-08-15 NOTE — Progress Notes (Signed)
Subjective:    Patient ID: Maria [name], female    DOB: [birth year], 57 y.o.   MRN: [id number]  HPI  Here with acute on chronic upper neck pain with some radiation to the mid left back, and bilat occiput, as well as no change chronic LBP.  Pt continues to have recurring LBP without change in severity, bowel or bladder change, fever, wt loss,  worsening LE pain/numbness/weakness, gait change or falls.  Also with several episdoes vertigo in the past wk Past Medical History  Diagnosis Date  . Acute lymphadenitis 08/13/2009  . Acute pharyngitis 02/10/2010  . ALLERGIC RHINITIS 08/10/2007  . ANXIETY 12/06/2007  . BACK PAIN 12/06/2007  . CHEST PAIN 08/13/2009  . DEPRESSION 12/06/2007  . DIVERTICULOSIS, [place] 12/06/2007  . GERD 12/06/2007  . HYPERTENSION 12/06/2007  . LOW BACK PAIN 12/06/2007  . MRSA 02/07/2009  . Pain in joint, multiple sites 04/21/2010  . PARONYCHIA, FINGER 08/13/2009  . RASH-NONVESICULAR 05/09/2008  . SHOULDER PAIN, LEFT 05/09/2008  . SINUSITIS- ACUTE-NOS 05/09/2008  . THORACIC/LUMBOSACRAL NEURITIS/RADICULITIS UNSPEC 12/28/2008  . Lumbar disc disease 03/09/2012  . Cervical disc disease 03/09/2012  . Chronic pain 03/09/2012  . Impaired glucose tolerance 03/13/2012  . Cellulitis   . Atherosclerotic peripheral vascular disease 06/13/2013    Aorta on CT June 2014   Past Surgical History  Procedure Laterality Date  . Abdominal hysterectomy    . S/p ovary cyst    . S/p right knee arthroscopy      Dr. Dion Saucier ortho  . Cervical disc surgery      reports that she quit smoking about 4 years ago. She has never used smokeless tobacco. She reports that she does not drink alcohol or use illicit drugs. family history includes Asthma in her daughter and sister; Cancer in her maternal uncle and other; Diabetes in her mother; Heart disease in her mother; Hypertension in her mother. Allergies  Allergen Reactions  . Amoxicillin-Pot Clavulanate Nausea And Vomiting  . Codeine Other (See Comments)     hallucinations  . Gluten Meal   . Lactose Intolerance (Gi)   . Morphine And Related Other (See Comments)    headaches  . Vicodin [Hydrocodone-Acetaminophen] Other (See Comments)    hallucinations  . Latex Rash   Current Outpatient Prescriptions on File Prior to Visit  Medication Sig Dispense Refill  . albuterol (PROVENTIL HFA;VENTOLIN HFA) 108 (90 BASE) MCG/ACT inhaler Inhale 2 puffs into the lungs every 4 (four) hours as needed for wheezing (cough, shortness of breath or wheezing.).  1 Inhaler  5  . ALPRAZolam (XANAX) 0.5 MG tablet Take 1 tablet (0.5 mg total) by mouth 2 (two) times daily as needed for anxiety.  60 tablet  2  . citalopram (CELEXA) 10 MG tablet Take 1 tablet (10 mg total) by mouth daily.  90 tablet  3  . hydrochlorothiazide (HYDRODIURIL) 25 MG tablet TAKE ONE TABLET BY MOUTH EVERY DAY  90 tablet  3  . loperamide (IMODIUM) 2 MG capsule Take 2 mg by mouth 4 (four) times daily as needed for diarrhea or loose stools (for upset stomach).       . [DISCONTINUED] DULoxetine (CYMBALTA) 60 MG capsule Take 60 mg by mouth daily.        . [DISCONTINUED] fexofenadine (ALLEGRA) 180 MG tablet Take 180 mg by mouth daily.        . [DISCONTINUED] furosemide (LASIX) 40 MG tablet Take 40 mg by mouth daily.         No current  facility-administered medications on file prior to visit.   Review of Systems  Constitutional: Negative for unexpected weight change, or unusual diaphoresis  HENT: Negative for tinnitus.   Eyes: Negative for photophobia and visual disturbance.  Respiratory: Negative for choking and stridor.   Gastrointestinal: Negative for vomiting and blood in stool.  Genitourinary: Negative for hematuria and decreased urine volume.  Musculoskeletal: Negative for acute joint swelling Skin: Negative for color change and wound.  Neurological: Negative for tremors and numbness other than noted  Psychiatric/Behavioral: Negative for decreased concentration or  hyperactivity.        Objective:   Physical Exam BP 120/72  Pulse 94  Temp(Src) 97.8 F (36.6 C) (Oral)  Ht 5\' 6"  (1.676 m)  Wt 328 lb 4 oz (148.893 kg)  BMI 53.01 kg/m2  SpO2 97% VS noted,  Constitutional: Pt appears well-developed and well-nourished.  HENT: Head: NCAT.  Right Ear: External ear normal.  Left Ear: External ear normal.  Eyes: Conjunctivae and EOM are normal. Pupils are equal, round, and reactive to light.  Left tm mild erythema, right TM clear, pharynx benign Neck: Normal range of motion. Neck supple.  Cardiovascular: Normal rate and regular rhythm.   Pulmonary/Chest: Effort normal and breath sounds normal.  Tender bilat upper cervical paravertebral area without swelling or rash Neurological: Pt is alert. Not confused . M,otor 5/5, gait ok, FTN intact Skin: Skin is warm. No erythema. No rash Psychiatric: Pt behavior is normal. Thought content normal. 2+ nervous    Assessment & Plan:

## 2013-08-15 NOTE — Patient Instructions (Signed)
Please take all new medication as prescribed - the oxycodone 5 mg tabs (instead of the percocet), meclizine for dizziness, and mucinex to help clear the left ear, and flexeril for muscle spasms OK to stop the zanaflex (tizanidine) Please continue all other medications as before, and refills have been done if requested. Please have the pharmacy call with any other refills you may need.

## 2013-08-15 NOTE — Assessment & Plan Note (Signed)
Low back pain no change neuro status but un controlled - for oxycodone asd,  to f/u any worsening symptoms or concerns

## 2013-08-17 ENCOUNTER — Inpatient Hospital Stay (HOSPITAL_COMMUNITY)
Admission: EM | Admit: 2013-08-17 | Discharge: 2013-08-19 | DRG: 864 | Disposition: A | Discharge status: Home / Self Care | Payer: MEDICAID | Attending: Internal Medicine | Admitting: Internal Medicine

## 2013-08-17 ENCOUNTER — Emergency Department (HOSPITAL_COMMUNITY): Payer: Self-pay

## 2013-08-17 ENCOUNTER — Inpatient Hospital Stay (HOSPITAL_COMMUNITY): Payer: Self-pay

## 2013-08-17 ENCOUNTER — Encounter (HOSPITAL_COMMUNITY): Payer: Self-pay | Admitting: *Deleted

## 2013-08-17 DIAGNOSIS — M79604 Pain in right leg: Secondary | ICD-10-CM

## 2013-08-17 DIAGNOSIS — J309 Allergic rhinitis, unspecified: Secondary | ICD-10-CM

## 2013-08-17 DIAGNOSIS — K5730 Diverticulosis of large intestine without perforation or abscess without bleeding: Secondary | ICD-10-CM | POA: Diagnosis present

## 2013-08-17 DIAGNOSIS — Z8614 Personal history of Methicillin resistant Staphylococcus aureus infection: Secondary | ICD-10-CM

## 2013-08-17 DIAGNOSIS — M25561 Pain in right knee: Secondary | ICD-10-CM

## 2013-08-17 DIAGNOSIS — R079 Chest pain, unspecified: Secondary | ICD-10-CM

## 2013-08-17 DIAGNOSIS — F329 Major depressive disorder, single episode, unspecified: Secondary | ICD-10-CM

## 2013-08-17 DIAGNOSIS — F411 Generalized anxiety disorder: Secondary | ICD-10-CM

## 2013-08-17 DIAGNOSIS — F3289 Other specified depressive episodes: Secondary | ICD-10-CM | POA: Diagnosis present

## 2013-08-17 DIAGNOSIS — M519 Unspecified thoracic, thoracolumbar and lumbosacral intervertebral disc disorder: Secondary | ICD-10-CM

## 2013-08-17 DIAGNOSIS — M545 Low back pain, unspecified: Secondary | ICD-10-CM

## 2013-08-17 DIAGNOSIS — B379 Candidiasis, unspecified: Secondary | ICD-10-CM

## 2013-08-17 DIAGNOSIS — Z789 Other specified health status: Secondary | ICD-10-CM

## 2013-08-17 DIAGNOSIS — R21 Rash and other nonspecific skin eruption: Secondary | ICD-10-CM

## 2013-08-17 DIAGNOSIS — IMO0002 Reserved for concepts with insufficient information to code with codable children: Secondary | ICD-10-CM

## 2013-08-17 DIAGNOSIS — I10 Essential (primary) hypertension: Secondary | ICD-10-CM | POA: Diagnosis present

## 2013-08-17 DIAGNOSIS — R509 Fever, unspecified: Principal | ICD-10-CM | POA: Diagnosis present

## 2013-08-17 DIAGNOSIS — R1310 Dysphagia, unspecified: Secondary | ICD-10-CM

## 2013-08-17 DIAGNOSIS — E876 Hypokalemia: Secondary | ICD-10-CM

## 2013-08-17 DIAGNOSIS — R6510 Systemic inflammatory response syndrome (SIRS) of non-infectious origin without acute organ dysfunction: Secondary | ICD-10-CM

## 2013-08-17 DIAGNOSIS — D72829 Elevated white blood cell count, unspecified: Secondary | ICD-10-CM | POA: Diagnosis present

## 2013-08-17 DIAGNOSIS — E669 Obesity, unspecified: Secondary | ICD-10-CM

## 2013-08-17 DIAGNOSIS — IMO0001 Reserved for inherently not codable concepts without codable children: Secondary | ICD-10-CM | POA: Diagnosis present

## 2013-08-17 DIAGNOSIS — K219 Gastro-esophageal reflux disease without esophagitis: Secondary | ICD-10-CM

## 2013-08-17 DIAGNOSIS — G8929 Other chronic pain: Secondary | ICD-10-CM | POA: Diagnosis present

## 2013-08-17 DIAGNOSIS — M5090 Cervical disc disorder, unspecified, unspecified cervical region: Secondary | ICD-10-CM

## 2013-08-17 DIAGNOSIS — M542 Cervicalgia: Secondary | ICD-10-CM

## 2013-08-17 DIAGNOSIS — R609 Edema, unspecified: Secondary | ICD-10-CM

## 2013-08-17 DIAGNOSIS — Z87891 Personal history of nicotine dependence: Secondary | ICD-10-CM

## 2013-08-17 DIAGNOSIS — R7302 Impaired glucose tolerance (oral): Secondary | ICD-10-CM

## 2013-08-17 DIAGNOSIS — R109 Unspecified abdominal pain: Secondary | ICD-10-CM | POA: Diagnosis present

## 2013-08-17 DIAGNOSIS — A4902 Methicillin resistant Staphylococcus aureus infection, unspecified site: Secondary | ICD-10-CM

## 2013-08-17 DIAGNOSIS — R52 Pain, unspecified: Secondary | ICD-10-CM | POA: Diagnosis present

## 2013-08-17 DIAGNOSIS — G894 Chronic pain syndrome: Secondary | ICD-10-CM | POA: Diagnosis present

## 2013-08-17 DIAGNOSIS — Z0000 Encounter for general adult medical examination without abnormal findings: Secondary | ICD-10-CM

## 2013-08-17 DIAGNOSIS — I70209 Unspecified atherosclerosis of native arteries of extremities, unspecified extremity: Secondary | ICD-10-CM

## 2013-08-17 DIAGNOSIS — M2550 Pain in unspecified joint: Secondary | ICD-10-CM

## 2013-08-17 DIAGNOSIS — Z79899 Other long term (current) drug therapy: Secondary | ICD-10-CM

## 2013-08-17 DIAGNOSIS — M25519 Pain in unspecified shoulder: Secondary | ICD-10-CM

## 2013-08-17 DIAGNOSIS — R42 Dizziness and giddiness: Secondary | ICD-10-CM

## 2013-08-17 LAB — URINE MICROSCOPIC-ADD ON

## 2013-08-17 LAB — URINALYSIS, ROUTINE W REFLEX MICROSCOPIC
Bilirubin Urine: NEGATIVE
Ketones, ur: NEGATIVE mg/dL
Nitrite: NEGATIVE
Protein, ur: NEGATIVE mg/dL
Specific Gravity, Urine: 1.018 (ref 1.005–1.030)
Urobilinogen, UA: 0.2 mg/dL (ref 0.0–1.0)

## 2013-08-17 LAB — COMPREHENSIVE METABOLIC PANEL
AST: 35 U/L (ref 0–37)
Albumin: 4 g/dL (ref 3.5–5.2)
Alkaline Phosphatase: 86 U/L (ref 39–117)
BUN: 8 mg/dL (ref 6–23)
Chloride: 96 mEq/L (ref 96–112)
Creatinine, Ser: 0.84 mg/dL (ref 0.50–1.10)
Potassium: 3.2 mEq/L — ABNORMAL LOW (ref 3.5–5.1)
Total Bilirubin: 0.5 mg/dL (ref 0.3–1.2)
Total Protein: 7.6 g/dL (ref 6.0–8.3)

## 2013-08-17 LAB — CBC WITH DIFFERENTIAL/PLATELET
Basophils Relative: 0 % (ref 0–1)
Eosinophils Absolute: 0.2 10*3/uL (ref 0.0–0.7)
Eosinophils Relative: 1 % (ref 0–5)
MCH: 32 pg (ref 26.0–34.0)
MCHC: 34.2 g/dL (ref 30.0–36.0)
Neutrophils Relative %: 72 % (ref 43–77)
Platelets: 284 10*3/uL (ref 150–400)

## 2013-08-17 LAB — SEDIMENTATION RATE: Sed Rate: 7 mm/hr (ref 0–22)

## 2013-08-17 LAB — POCT I-STAT, CHEM 8
Glucose, Bld: 115 mg/dL — ABNORMAL HIGH (ref 70–99)
HCT: 48 % — ABNORMAL HIGH (ref 36.0–46.0)
Hemoglobin: 16.3 g/dL — ABNORMAL HIGH (ref 12.0–15.0)
Potassium: 3.7 mEq/L (ref 3.5–5.1)
Sodium: 140 mEq/L (ref 135–145)
TCO2: 29 mmol/L (ref 0–100)

## 2013-08-17 LAB — LIPASE, BLOOD: Lipase: 23 U/L (ref 11–59)

## 2013-08-17 MED ORDER — PNEUMOCOCCAL VAC POLYVALENT 25 MCG/0.5ML IJ INJ
0.5000 mL | INJECTION | INTRAMUSCULAR | Status: AC
  Administered 2013-08-18: 0.5 mL via INTRAMUSCULAR
  Filled 2013-08-17 (×2): qty 0.5

## 2013-08-17 MED ORDER — ACETAMINOPHEN 325 MG PO TABS
650.0000 mg | ORAL_TABLET | Freq: Four times a day (QID) | ORAL | Status: DC | PRN
  Administered 2013-08-17 – 2013-08-18 (×2): 650 mg via ORAL
  Filled 2013-08-17 (×2): qty 2

## 2013-08-17 MED ORDER — ALBUTEROL SULFATE HFA 108 (90 BASE) MCG/ACT IN AERS
2.0000 | INHALATION_SPRAY | RESPIRATORY_TRACT | Status: DC | PRN
  Filled 2013-08-17: qty 6.7

## 2013-08-17 MED ORDER — IOHEXOL 300 MG/ML  SOLN
50.0000 mL | Freq: Once | INTRAMUSCULAR | Status: AC | PRN
  Administered 2013-08-17: 50 mL via ORAL

## 2013-08-17 MED ORDER — HYDROMORPHONE HCL PF 1 MG/ML IJ SOLN
0.5000 mg | INTRAMUSCULAR | Status: DC | PRN
  Administered 2013-08-17 – 2013-08-18 (×6): 0.5 mg via INTRAVENOUS
  Filled 2013-08-17 (×6): qty 1

## 2013-08-17 MED ORDER — ACETAMINOPHEN 325 MG PO TABS
650.0000 mg | ORAL_TABLET | Freq: Once | ORAL | Status: AC
  Administered 2013-08-17: 650 mg via ORAL
  Filled 2013-08-17: qty 2

## 2013-08-17 MED ORDER — ENOXAPARIN SODIUM 80 MG/0.8ML ~~LOC~~ SOLN
70.0000 mg | SUBCUTANEOUS | Status: DC
  Administered 2013-08-17 – 2013-08-19 (×3): 70 mg via SUBCUTANEOUS
  Filled 2013-08-17 (×3): qty 0.8

## 2013-08-17 MED ORDER — CIPROFLOXACIN IN D5W 400 MG/200ML IV SOLN
400.0000 mg | Freq: Two times a day (BID) | INTRAVENOUS | Status: DC
  Administered 2013-08-17 – 2013-08-18 (×3): 400 mg via INTRAVENOUS
  Filled 2013-08-17 (×5): qty 200

## 2013-08-17 MED ORDER — SODIUM CHLORIDE 0.9 % IV SOLN
Freq: Once | INTRAVENOUS | Status: AC
  Administered 2013-08-17: 06:00:00 via INTRAVENOUS

## 2013-08-17 MED ORDER — METRONIDAZOLE IN NACL 5-0.79 MG/ML-% IV SOLN
500.0000 mg | Freq: Three times a day (TID) | INTRAVENOUS | Status: DC
  Administered 2013-08-17: 500 mg via INTRAVENOUS
  Filled 2013-08-17: qty 100

## 2013-08-17 MED ORDER — CITALOPRAM HYDROBROMIDE 10 MG PO TABS
10.0000 mg | ORAL_TABLET | Freq: Every day | ORAL | Status: DC
  Administered 2013-08-17 – 2013-08-19 (×3): 10 mg via ORAL
  Filled 2013-08-17 (×3): qty 1

## 2013-08-17 MED ORDER — ACETAMINOPHEN 650 MG RE SUPP: 650.0000 mg | Freq: Four times a day (QID) | RECTAL | Status: DC | PRN

## 2013-08-17 MED ORDER — ONDANSETRON HCL 4 MG PO TABS: 4.0000 mg | ORAL_TABLET | Freq: Four times a day (QID) | ORAL | Status: DC | PRN

## 2013-08-17 MED ORDER — HYDROMORPHONE HCL PF 1 MG/ML IJ SOLN
1.0000 mg | Freq: Once | INTRAMUSCULAR | Status: AC
  Administered 2013-08-17: 1 mg via INTRAVENOUS
  Filled 2013-08-17: qty 1

## 2013-08-17 MED ORDER — ALPRAZOLAM 0.5 MG PO TABS
0.5000 mg | ORAL_TABLET | Freq: Two times a day (BID) | ORAL | Status: DC | PRN
  Administered 2013-08-17 – 2013-08-19 (×2): 0.5 mg via ORAL
  Filled 2013-08-17 (×2): qty 1

## 2013-08-17 MED ORDER — SODIUM CHLORIDE 0.9 % IV SOLN
INTRAVENOUS | Status: DC
  Administered 2013-08-17 – 2013-08-19 (×4): via INTRAVENOUS

## 2013-08-17 MED ORDER — ONDANSETRON HCL 4 MG/2ML IJ SOLN
4.0000 mg | Freq: Four times a day (QID) | INTRAMUSCULAR | Status: DC | PRN
  Administered 2013-08-19 (×2): 4 mg via INTRAVENOUS
  Filled 2013-08-17 (×2): qty 2

## 2013-08-17 MED ORDER — OXYCODONE HCL 5 MG PO TABS
5.0000 mg | ORAL_TABLET | ORAL | Status: DC | PRN
  Administered 2013-08-18 – 2013-08-19 (×3): 5 mg via ORAL
  Filled 2013-08-17 (×3): qty 1

## 2013-08-17 MED ORDER — IOHEXOL 300 MG/ML  SOLN
125.0000 mL | Freq: Once | INTRAMUSCULAR | Status: AC | PRN
  Administered 2013-08-17: 125 mL via INTRAVENOUS

## 2013-08-17 MED ORDER — ONDANSETRON HCL 4 MG/2ML IJ SOLN
4.0000 mg | Freq: Once | INTRAMUSCULAR | Status: AC
  Administered 2013-08-17: 4 mg via INTRAVENOUS
  Filled 2013-08-17: qty 2

## 2013-08-17 MED ORDER — SODIUM CHLORIDE 0.9 % IJ SOLN: 3.0000 mL | Freq: Two times a day (BID) | INTRAMUSCULAR | Status: DC

## 2013-08-17 NOTE — Care Management Note (Addendum)
    Page 1 of 1   08/18/2013     11:35:40 AM   CARE MANAGEMENT NOTE 08/18/2013  Patient:  Maria [name], Maria [name]   Account Number:  [id number]  Date Initiated:  08/17/2013  Documentation initiated by:  Lanier Clam  Subjective/Objective Assessment:   57 Y/O F ADMITTED W/FEVER,CHILLS,ABD PAIN.DIVERTICULOSIS.GN:FAOZHYQMVHQION.     Action/Plan:   FROM HOME.HAS PCP,PHARMACY.   Anticipated DC Date:  08/23/2013   Anticipated DC Plan:  HOME/SELF CARE  In-house referral  Financial Counselor      DC Planning Services  CM consult      Choice offered to / List presented to:             Status of service:  In process, will continue to follow Medicare Important Message given?   (If response is "NO", the following Medicare IM given date fields will be blank) Date Medicare IM given:   Date Additional Medicare IM given:    Discharge Disposition:    Per UR Regulation:  Reviewed for med. necessity/level of care/duration of stay  If discussed at Long Length of Stay Meetings, dates discussed:    Comments:  08/18/13 [facility] RN,BSN NCM [phone] PROVIDED W/HEALTH INSURANCE WEBSITE,& COMMUNITY RESOURCES.  08/17/13 Yasmeen Manka RN,BSN NCM [phone]

## 2013-08-17 NOTE — ED Provider Notes (Signed)
Medical screening examination/treatment/procedure(s) were performed by non-physician practitioner and as supervising physician I was immediately available for consultation/collaboration.   Lyanne Co, MD 08/17/13 [phone]

## 2013-08-17 NOTE — ED Provider Notes (Signed)
CSN: [id number]     Arrival date & time 08/17/13  6213 History   First MD Initiated Contact with Patient 08/17/13 0405     Chief Complaint  Patient presents with  . Emesis  . Fever   (Consider location/radiation/quality/duration/timing/severity/associated sxs/prior Treatment) HPI Comments: Patient woke with fever, myalgias, nausea and 1 episode of vomiting on arrival to the ED Did not take any antipyretic PTA  Patient has chronic back pain that she take Percocet daily has had to take an extra dose daily for the past 2 days   Patient is a 57 y.o. female presenting with vomiting and fever. The history is provided by the patient.  Emesis Severity:  Mild Duration:  2 hours Timing:  Intermittent Quality:  Bilious material Progression:  Unchanged Recent urination:  Normal Ineffective treatments:  None tried Associated symptoms: chills, fever and myalgias   Associated symptoms: no diarrhea   Fever Associated symptoms: chills, myalgias, nausea and vomiting   Associated symptoms: no diarrhea     Past Medical History  Diagnosis Date  . Acute lymphadenitis 08/13/2009  . Acute pharyngitis 02/10/2010  . ALLERGIC RHINITIS 08/10/2007  . ANXIETY 12/06/2007  . BACK PAIN 12/06/2007  . CHEST PAIN 08/13/2009  . DEPRESSION 12/06/2007  . DIVERTICULOSIS, [place] 12/06/2007  . GERD 12/06/2007  . HYPERTENSION 12/06/2007  . LOW BACK PAIN 12/06/2007  . MRSA 02/07/2009  . Pain in joint, multiple sites 04/21/2010  . PARONYCHIA, FINGER 08/13/2009  . RASH-NONVESICULAR 05/09/2008  . SHOULDER PAIN, LEFT 05/09/2008  . SINUSITIS- ACUTE-NOS 05/09/2008  . THORACIC/LUMBOSACRAL NEURITIS/RADICULITIS UNSPEC 12/28/2008  . Lumbar disc disease 03/09/2012  . Cervical disc disease 03/09/2012  . Chronic pain 03/09/2012  . Impaired glucose tolerance 03/13/2012  . Cellulitis   . Atherosclerotic peripheral vascular disease 06/13/2013    Aorta on CT June 2014   Past Surgical History  Procedure Laterality Date  . Abdominal  hysterectomy    . S/p ovary cyst    . S/p right knee arthroscopy      Dr. Dion Saucier ortho  . Cervical disc surgery     Family History  Problem Relation Age of Onset  . Heart disease Mother   . Hypertension Mother   . Diabetes Mother   . Asthma Sister   . Asthma Daughter   . Cancer Maternal Uncle     Nguyen  . Cancer Other     ovarian   History  Substance Use Topics  . Smoking status: Former Smoker -- 30 years    Quit date: 11/08/2008  . Smokeless tobacco: Never Used     Comment: quit 10/09  . Alcohol Use: No   OB History   Grav Para Term Preterm Abortions TAB SAB Ect Mult Living                 Review of Systems  Unable to perform ROS Constitutional: Positive for fever and chills.  Gastrointestinal: Positive for nausea and vomiting. Negative for diarrhea and constipation.  Genitourinary: Negative for frequency and flank pain.  Musculoskeletal: Positive for myalgias and back pain.  Skin: Negative for pallor.  All other systems reviewed and are negative.    Allergies  Amoxicillin-pot clavulanate; Codeine; Gluten meal; Lactose intolerance (gi); Morphine and related; Vicodin; and Latex  Home Medications   Current Outpatient Rx  Name  Route  Sig  Dispense  Refill  . albuterol (PROVENTIL HFA;VENTOLIN HFA) 108 (90 BASE) MCG/ACT inhaler   Inhalation   Inhale 2 puffs into the lungs every 4 (four)  hours as needed for wheezing (cough, shortness of breath or wheezing.).   1 Inhaler   5   . ALPRAZolam (XANAX) 0.5 MG tablet   Oral   Take 1 tablet (0.5 mg total) by mouth 2 (two) times daily as needed for anxiety.   60 tablet   2   . citalopram (CELEXA) 10 MG tablet   Oral   Take 1 tablet (10 mg total) by mouth daily.   90 tablet   3   . cyclobenzaprine (FLEXERIL) 5 MG tablet   Oral   Take 1 tablet (5 mg total) by mouth 3 (three) times daily as needed for muscle spasms.   90 tablet   2   . hydrochlorothiazide (HYDRODIURIL) 25 MG tablet      TAKE ONE TABLET BY  MOUTH EVERY DAY   90 tablet   3   . loperamide (IMODIUM) 2 MG capsule   Oral   Take 2 mg by mouth 4 (four) times daily as needed for diarrhea or loose stools (for upset stomach).          . meclizine (ANTIVERT) 12.5 MG tablet   Oral   Take 1 tablet (12.5 mg total) by mouth 3 (three) times daily as needed.   30 tablet   1   . oxyCODONE (OXY IR/ROXICODONE) 5 MG immediate release tablet      1-2 tabs by mouth every 6 hrs as needed for pain   240 tablet   0    BP 164/87  Pulse 120  Temp(Src) 103 F (39.4 C) (Oral)  Resp 24  SpO2 94% Physical Exam  Nursing note and vitals reviewed. Constitutional: She is oriented to person, place, and time. She appears well-developed and well-nourished.  Morbidly obese  HENT:  Head: Normocephalic.  Mouth/Throat: Oropharynx is clear and moist.  Neck: Normal range of motion.  Cardiovascular: Regular rhythm.  Tachycardia present.   Pulmonary/Chest: Effort normal and breath sounds normal.  Abdominal: Soft. Bowel sounds are normal. She exhibits no distension. There is no tenderness.  Musculoskeletal: Normal range of motion. She exhibits tenderness.  Neurological: She is alert and oriented to person, place, and time.  Skin: There is pallor.  States her skin hurts    ED Course  Procedures (including critical care time) Labs Review Labs Reviewed  URINALYSIS, ROUTINE W REFLEX MICROSCOPIC  CBC WITH DIFFERENTIAL   Imaging Review No results found.  MDM  No diagnosis found.      Arman Filter, NP 08/17/13 [phone]

## 2013-08-17 NOTE — ED Notes (Signed)
Admitting orders include xrays and CT, pt to have these completed before leaving the floor. Report has been called to [id number],

## 2013-08-17 NOTE — H&P (Signed)
[facility] History and Physical  Patient: Maria [name]  ZOX:[id number]  DOB: Aug 12, 1956  DOA: 08/17/2013  Referring physician: Dr. Patria Mane PCP: Oliver Barre, MD   Chief Complaint: Fever and chills with nausea and abdominal pain  HPI: Maria [name] is a 57 y.o. female with Past medical history of fibromyalgia, hypertension, GERD, diverticulosis, right-sided renal cyst. She presented today to ER with the complaint of sudden onset of abdominal pain associated with fever and chills. She mentions that she has similar episodes in the past with a recent episode was in June 2014.  She had been extensively worked up for the same symptoms, and was referred to rheumatologist recently by her PCP she has chronic back pain due to her fibromyalgia. Today she mentions that she has significant back pain on her right side of the flank is different than her usual. She also complains of one-week history of urinary hesitancy and frequency. She denies any hematuria. She denies any constipation, diarrhea, chest pain, shortness of breath, cough. She denies any rash anywhere. She denies any trauma or illicit drug use.  Review of Systems: as mentioned in the history of present illness.  A Comprehensive review of the other systems is negative.  Past Medical History  Diagnosis Date  . Acute lymphadenitis 08/13/2009  . Acute pharyngitis 02/10/2010  . ALLERGIC RHINITIS 08/10/2007  . ANXIETY 12/06/2007  . BACK PAIN 12/06/2007  . CHEST PAIN 08/13/2009  . DEPRESSION 12/06/2007  . DIVERTICULOSIS, [place] 12/06/2007  . GERD 12/06/2007  . HYPERTENSION 12/06/2007  . LOW BACK PAIN 12/06/2007  . MRSA 02/07/2009  . Pain in joint, multiple sites 04/21/2010  . PARONYCHIA, FINGER 08/13/2009  . RASH-NONVESICULAR 05/09/2008  . SHOULDER PAIN, LEFT 05/09/2008  . SINUSITIS- ACUTE-NOS 05/09/2008  . THORACIC/LUMBOSACRAL NEURITIS/RADICULITIS UNSPEC 12/28/2008  . Lumbar disc disease 03/09/2012  . Cervical disc disease 03/09/2012  .  Chronic pain 03/09/2012  . Impaired glucose tolerance 03/13/2012  . Cellulitis   . Atherosclerotic peripheral vascular disease 06/13/2013    Aorta on CT June 2014   Past Surgical History  Procedure Laterality Date  . Abdominal hysterectomy    . S/p ovary cyst    . S/p right knee arthroscopy      Dr. Dion Saucier ortho  . Cervical disc surgery     Social History:  reports that she quit smoking about 4 years ago. She has never used smokeless tobacco. She reports that she does not drink alcohol or use illicit drugs. Patient is coming from home. Independent for most of her  ADL.  Allergies  Allergen Reactions  . Amoxicillin-Pot Clavulanate Nausea And Vomiting  . Codeine Other (See Comments)    hallucinations  . Gluten Meal   . Lactose Intolerance (Gi)   . Morphine And Related Other (See Comments)    headaches  . Vicodin [Hydrocodone-Acetaminophen] Other (See Comments)    hallucinations  . Latex Rash    Family History  Problem Relation Age of Onset  . Heart disease Mother   . Hypertension Mother   . Diabetes Mother   . Asthma Sister   . Asthma Daughter   . Cancer Maternal Uncle     [place]  . Cancer Other     ovarian    Prior to Admission medications   Medication Sig Start Date End Date Taking? Authorizing Provider  albuterol (PROVENTIL HFA;VENTOLIN HFA) 108 (90 BASE) MCG/ACT inhaler Inhale 2 puffs into the lungs every 4 (four) hours as needed for wheezing (cough, shortness of breath or wheezing.).  12/22/12  Yes Corwin Levins, MD  ALPRAZolam Prudy Feeler) 0.5 MG tablet Take 1 tablet (0.5 mg total) by mouth 2 (two) times daily as needed for anxiety. 07/10/13  Yes Corwin Levins, MD  citalopram (CELEXA) 10 MG tablet Take 1 tablet (10 mg total) by mouth daily. 04/05/13  Yes Corwin Levins, MD  cyclobenzaprine (FLEXERIL) 5 MG tablet Take 1 tablet (5 mg total) by mouth 3 (three) times daily as needed for muscle spasms. 08/15/13  Yes Corwin Levins, MD  hydrochlorothiazide (HYDRODIURIL) 25 MG tablet TAKE  ONE TABLET BY MOUTH EVERY DAY 07/10/13  Yes Corwin Levins, MD  meclizine (ANTIVERT) 12.5 MG tablet Take 1 tablet (12.5 mg total) by mouth 3 (three) times daily as needed. 08/15/13  Yes Corwin Levins, MD  oxyCODONE (OXY IR/ROXICODONE) 5 MG immediate release tablet 1-2 tabs by mouth every 6 hrs as needed for pain 08/15/13  Yes Corwin Levins, MD  loperamide (IMODIUM) 2 MG capsule Take 2 mg by mouth 4 (four) times daily as needed for diarrhea or loose stools (for upset stomach).     Historical Provider, MD    Physical Exam: Filed Vitals:   08/17/13 0343 08/17/13 0630  BP: 164/87 122/49  Pulse: 120 107  Temp: 103 F (39.4 C) 102.3 F (39.1 C)  TempSrc: Oral Oral  Resp: 24 20  SpO2: 94% 95%    General: Alert, Awake and Oriented to Time, Place and Person. Appear in severe distress Eyes: PERRL ENT: Oral Mucosa clear moist. Neck: no JVD, no Carotid Bruits  Cardiovascular: S1 and S2 Present, no Murmur, Peripheral Pulses Present Respiratory: Bilateral Air entry equal and Decreased, Clear to Auscultation,  no Crackles,no wheezes Abdomen: Bowel Sound Present, Soft and diffusely tender, more in right flank region, unable to check murphy sign due to diffuse tenderness Skin: no Rash Extremities: no Pedal edema, no calf tenderness Neurologic: Grossly Unremarkable.  Labs on Admission:  CBC:  Recent Labs Lab 08/17/13 0359 08/17/13 0423  WBC 20.9*  --   NEUTROABS 15.0*  --   HGB 15.5* 16.3*  HCT 45.3 48.0*  MCV 93.4  --   PLT 284  --     CMP     Component Value Date/Time   NA 140 08/17/2013 0423   K 3.7 08/17/2013 0423   CL 98 08/17/2013 0423   CO2 34* 06/09/2013 0540   GLUCOSE 115* 08/17/2013 0423   GLUCOSE 107* 12/31/2006 0847   BUN 7 08/17/2013 0423   CREATININE 1.10 08/17/2013 0423   CALCIUM 8.3* 06/09/2013 0540   PROT 6.1 06/08/2013 0618   ALBUMIN 3.3* 06/08/2013 0618   AST 15 06/08/2013 0618   ALT 18 06/08/2013 0618   ALKPHOS 58 06/08/2013 0618   BILITOT 0.9 06/08/2013 0618   GFRNONAA  77* 06/09/2013 0540   GFRAA 90* 06/09/2013 0540    No results found for this basename: LIPASE, AMYLASE,  in the last 168 hours No results found for this basename: AMMONIA,  in the last 168 hours  Cardiac Enzymes: No results found for this basename: CKTOTAL, CKMB, CKMBINDEX, TROPONINI,  in the last 168 hours  BNP (last 3 results) No results found for this basename: PROBNP,  in the last 8760 hours  Radiological Exams on Admission: Dg Chest 2 View  08/17/2013   *RADIOLOGY REPORT*  Clinical Data: Weakness, fever  CHEST - 2 VIEW  Comparison: 11/09/2012  Findings: Stable cardiomediastinal contours.  No consolidation, pleural effusion, pneumothorax.  Cervical fusion hardware.  No  acute osseous finding.  IMPRESSION: No radiographic evidence of acute cardiopulmonary process.   Original Report Authenticated By: Jearld Lesch, M.D.    Assessment/Plan Principal Problem:   Abdominal pain, acute Active Problems:   DIVERTICULOSIS, [place]   Chronic pain   SIRS (systemic inflammatory response syndrome)   1. Abdominal pain, acute The pt acute onset of fever and chills with leucocytosis. She has severe back pain, and abdominal pain that has been occasionally worsening with food. She also reports increasing urinary frequency With nausea. Although she has history of recurrent fever of unknown lesion, and has been referred to rheumatology it is unclear whether her current presentation is an infectious process or inflammatory process. Also, she doesn't have a CMP drawn at present, which could suggest possible liver involvement with right-sided pain. Looking at her history she had 4 CT scan of abdomen and last 18 months, of which 1 was positive for possible diverticulitis, and most of the scan has shown right renal cyst. At present with uncertain etiology, significant fever, and severe flank pain with urinary symptoms, patient will be presumably treated with UTI with IV antibiotics.  Considering history of  diverticulosis and diverticulitis Flagyl will also be added for empirical treatment. CT scan of the abdomen will be ordered as the patient is significantly symptomatic. Inflammatory markers including ESR CRP and ANA will be ordered. Amylase and lipase are also checked. Patient will be kept n.p.o. IV fluids will be given. Antihypertensives are on hold.  2. chronic pain syndrome Patient has fibromyalgia has been on Percocet and Cymbalta.  Unfortunately she is only taking Percocet, might benefit from adding Cymbalta at the time of the discharge.  3. Anxiety Continue Xanax  4. Pain control IV Dilaudid as needed.  DVT Prophylaxis: subcutaneous Heparin Nutrition: N.p.o.  Code Status: Full    Author: Lynden Oxford, MD [facility] Pager: [phone] 08/17/2013, 6:42 AM    If 7PM-7AM, please contact night-coverage [email or web address] Password [email or web address]

## 2013-08-17 NOTE — ED Notes (Signed)
Notified RN, Autumn pt. Hemoglobin 16.3.

## 2013-08-17 NOTE — Progress Notes (Signed)
Pt  Transferred to room 1419 for continuity of care, requires telemetry. Report called to RN on that unit.

## 2013-08-17 NOTE — Progress Notes (Signed)
Patient just admitted earlier today from the ED with fever and abdominal pain. Etiology remains unclear at this time. CT Abdomen relatively normal with the exception of diverticulosis. There is suspicion for a UTI by symptoms, but her UA is unremarkable. She remains febrile at present. Continue cipro pending cx data. Will DC flagyl as she is without evidence of bowel infection on CT. Will continue to follow closely.  Peggye Pitt, MD [facility] Pager: [phone]

## 2013-08-17 NOTE — ED Notes (Signed)
Vomiting in triage; woke up with chills; body aches

## 2013-08-18 LAB — CBC
MCH: 31.2 pg (ref 26.0–34.0)
MCHC: 33.5 g/dL (ref 30.0–36.0)
MCV: 93.1 fL (ref 78.0–100.0)
Platelets: 226 10*3/uL (ref 150–400)
RDW: 13 % (ref 11.5–15.5)

## 2013-08-18 LAB — BASIC METABOLIC PANEL
BUN: 11 mg/dL (ref 6–23)
CO2: 30 mEq/L (ref 19–32)
Calcium: 8.2 mg/dL — ABNORMAL LOW (ref 8.4–10.5)
Creatinine, Ser: 0.89 mg/dL (ref 0.50–1.10)
GFR calc non Af Amer: 71 mL/min — ABNORMAL LOW (ref >90)
Glucose, Bld: 105 mg/dL — ABNORMAL HIGH (ref 70–99)

## 2013-08-18 LAB — GLUCOSE, CAPILLARY

## 2013-08-18 LAB — ANA: Anti Nuclear Antibody(ANA): POSITIVE — AB

## 2013-08-18 MED ORDER — OXYCODONE HCL 5 MG PO TABS
5.0000 mg | ORAL_TABLET | Freq: Once | ORAL | Status: AC
  Administered 2013-08-19: 5 mg via ORAL
  Filled 2013-08-18: qty 1

## 2013-08-18 NOTE — Progress Notes (Signed)
[facility] PROGRESS NOTE  Maria [name] ZOX:[id number] DOB: 1956-10-27 DOA: 08/17/2013 PCP: Oliver Barre, MD  Assessment/Plan: FUO -Has been ongoing for over a month. -Has had all infectious workup and has seen Dr. Ninetta Lights in the office. -Plan was for rheumatology referral, but she never followed up. -Initial ANA is positive. -Will DC all antibiotics and if no fever spikes overnight, will DC home in am to follow up with rheum.  Leukocytosis -Down to 13.6 from 20.9 on admission.  Code Status: Full [place] Family Communication: Patient only Disposition Plan: Home, likely in 24 hours.   Consultants:  None   Antibiotics:  None   Subjective: C/o pain all over  Objective: Filed Vitals:   08/17/13 1244 08/17/13 1900 08/17/13 2202 08/18/13 0601  BP: 143/64  141/58 105/46  Pulse: 115  104 82  Temp: 100.9 F (38.3 C) 100 F (37.8 C) 100.6 F (38.1 C) 98.4 F (36.9 C)  TempSrc: Oral Oral Oral Oral  Resp:   20 18  Height: 5\' 6"  (1.676 m)     Weight: 150.5 kg (331 lb 12.7 oz)     SpO2: 100%  100% 98%    Intake/Output Summary (Last 24 hours) at 08/18/13 1325 Last data filed at 08/18/13 1200  Gross per 24 hour  Intake 1916.25 ml  Output      0 ml  Net 1916.25 ml   Filed Weights   08/17/13 1244  Weight: 150.5 kg (331 lb 12.7 oz)    Exam:   General:  AA Ox3  Cardiovascular: RRR, no M/R/G  Respiratory: CTA B  Abdomen: obese/S/NT/ND/+BS/no masses  Extremities: 2-3+ edema bilaterally, mild erythema RLE, which patient says in chronic in nature.   Neurologic:  Grossly intact and non-focal.  Data Reviewed: Basic Metabolic Panel:  Recent Labs Lab 08/17/13 0356 08/17/13 0423 08/18/13 0502  NA 137 140 137  K 3.2* 3.7 3.6  CL 96 98 101  CO2 30  --  30  GLUCOSE 114* 115* 105*  BUN 8 7 11   CREATININE 0.84 1.10 0.89  CALCIUM 9.4  --  8.2*   Liver Function Tests:  Recent Labs Lab 08/17/13 0356  AST 35  ALT 50*  ALKPHOS 86  BILITOT 0.5  PROT  7.6  ALBUMIN 4.0    Recent Labs Lab 08/17/13 0356  LIPASE 23   No results found for this basename: AMMONIA,  in the last 168 hours CBC:  Recent Labs Lab 08/17/13 0359 08/17/13 0423 08/18/13 0502  WBC 20.9*  --  13.6*  NEUTROABS 15.0*  --   --   HGB 15.5* 16.3* 12.7  HCT 45.3 48.0* 37.9  MCV 93.4  --  93.1  PLT 284  --  226   Cardiac Enzymes: No results found for this basename: CKTOTAL, CKMB, CKMBINDEX, TROPONINI,  in the last 168 hours BNP (last 3 results) No results found for this basename: PROBNP,  in the last 8760 hours CBG:  Recent Labs Lab 08/17/13 1726 08/18/13 0747  GLUCAP 116* 103*    Recent Results (from the past 240 hour(s))  CULTURE, BLOOD (ROUTINE X 2)     Status: None   Collection Time    08/17/13  5:41 AM      Result Value Range Status   Specimen Description BLOOD RIGHT HAND   Final   Special Requests BOTTLES DRAWN AEROBIC AND ANAEROBIC 5CC   Final   Culture  Setup Time     Final   Value: 08/17/2013 10:00  Performed at [facility]     Final   Value:        BLOOD CULTURE RECEIVED NO GROWTH TO DATE CULTURE WILL BE HELD FOR 5 DAYS BEFORE ISSUING A FINAL NEGATIVE REPORT     Performed at [facility]   Report Status PENDING   Incomplete  CULTURE, BLOOD (ROUTINE X 2)     Status: None   Collection Time    08/17/13  5:45 AM      Result Value Range Status   Specimen Description BLOOD LEFT HAND   Final   Special Requests BOTTLES DRAWN AEROBIC AND ANAEROBIC 5CC   Final   Culture  Setup Time     Final   Value: 08/17/2013 10:00     Performed at [facility]   Culture     Final   Value:        BLOOD CULTURE RECEIVED NO GROWTH TO DATE CULTURE WILL BE HELD FOR 5 DAYS BEFORE ISSUING A FINAL NEGATIVE REPORT     Performed at [facility]   Report Status PENDING   Incomplete     Studies: Dg Chest 2 View  08/17/2013   *RADIOLOGY REPORT*  Clinical Data: Weakness, fever  CHEST - 2 VIEW  Comparison: 11/09/2012   Findings: Stable cardiomediastinal contours.  No consolidation, pleural effusion, pneumothorax.  Cervical fusion hardware.  No acute osseous finding.  IMPRESSION: No radiographic evidence of acute cardiopulmonary process.   Original Report Authenticated By: Jearld Lesch, M.D.   Ct Abdomen Pelvis W Contrast  08/17/2013   *RADIOLOGY REPORT*  Clinical Data: Diffuse abdominal pain.  History of diverticulitis. Right-sided flank pain.  CT ABDOMEN AND PELVIS WITH CONTRAST  Technique:  Multidetector CT imaging of the abdomen and pelvis was performed following the standard protocol during bolus administration of intravenous contrast.  Contrast: 50mL OMNIPAQUE IOHEXOL 300 MG/ML  SOLN, OMNIPAQUE IOHEXOL 300 MG/ML  SOLN  Comparison: CT of the abdomen and pelvis 06/07/2013.  Findings:  Lung Bases: Unremarkable.  Abdomen/Pelvis:  The hepatic parenchyma is diffusely low attenuation, suggestive of hepatic steatosis (difficult to assess for certain on the contrast enhanced CT examination).  The appearance of the gallbladder, pancreas, spleen, bilateral adrenal glands and the left kidney is unremarkable.  Subcentimeter low attenuation lesion in the lower pole of the right kidney is too small to definitively characterize, but is similar to the prior study, and favored to represent a small cyst.  There are numerous colonic diverticula, particularly in the region of the sigmoid Kady, without surrounding inflammatory changes to suggest acute diverticulitis at this time.  Normal appendix.  No significant volume of ascites.  No pneumoperitoneum.  No pathologic distension of small bowel.  No definite pathologic lymphadenopathy identified within the abdomen or pelvis.  Mild atherosclerosis throughout the abdominal and pelvic vasculature, without evidence of aneurysm or dissection.  Status post hysterectomy.  Ovaries are not confidently identified may be surgically absent or atrophic. Urinary bladder is unremarkable in  appearance.  Musculoskeletal: There are no aggressive appearing lytic or blastic lesions noted in the visualized portions of the skeleton.  IMPRESSION: 1.  No acute findings in the abdomen or pelvis to account for the patient's symptoms. 2.  Colonic diverticulosis without findings to suggest acute diverticulitis at this time. 3.  Normal appendix. 4.  Probable hepatic steatosis. 5.  Additional findings, as above.   Original Report Authenticated By: Trudie Reed, M.D.    Scheduled Meds: . citalopram  10 mg Oral Daily  . enoxaparin (LOVENOX) injection  70 mg Subcutaneous Q24H  . sodium chloride  3 mL Intravenous Q12H   Continuous Infusions: . sodium chloride 75 mL/hr at 08/18/13 0020    Principal Problem:   FUO (fever of unknown origin) Active Problems:   DIVERTICULOSIS, [place]   Chronic pain    Time spent: 40 minutes.    Chaya Jan  [facility] Pager [phone]  If 7PM-7AM, please contact night-coverage at [email or web address], password [facility] 08/18/2013, 1:25 PM  LOS: 1 day

## 2013-08-19 LAB — CBC
HCT: 37.8 % (ref 36.0–46.0)
Hemoglobin: 12.5 g/dL (ref 12.0–15.0)
MCH: 30.7 pg (ref 26.0–34.0)
RBC: 4.07 MIL/uL (ref 3.87–5.11)

## 2013-08-19 LAB — BASIC METABOLIC PANEL
CO2: 28 mEq/L (ref 19–32)
Chloride: 103 mEq/L (ref 96–112)
GFR calc non Af Amer: >90 mL/min (ref >90)
Glucose, Bld: 132 mg/dL — ABNORMAL HIGH (ref 70–99)
Potassium: 4.1 mEq/L (ref 3.5–5.1)
Sodium: 138 mEq/L (ref 135–145)

## 2013-08-19 LAB — GLUCOSE, CAPILLARY: Glucose-Capillary: 153 mg/dL — ABNORMAL HIGH (ref 70–99)

## 2013-08-19 MED ORDER — ONDANSETRON HCL 4 MG PO TABS: 4.0000 mg | ORAL_TABLET | Freq: Four times a day (QID) | ORAL | Status: DC | PRN

## 2013-08-19 MED ORDER — ONDANSETRON HCL 4 MG/2ML IJ SOLN
4.0000 mg | Freq: Once | INTRAMUSCULAR | Status: AC
  Administered 2013-08-19: 4 mg via INTRAVENOUS
  Filled 2013-08-19: qty 2

## 2013-08-19 MED ORDER — ACETAMINOPHEN 325 MG PO TABS: 650.0000 mg | ORAL_TABLET | Freq: Four times a day (QID) | ORAL | Status: DC | PRN

## 2013-08-19 NOTE — Discharge Summary (Signed)
Physician Discharge Summary  Maria [name] Phimmasone ZOX:[id number] DOB: 01/31/1956 DOA: 08/17/2013  PCP: Oliver Barre, MD  Admit date: 08/17/2013 Discharge date: 08/19/2013  Time spent: 45 minutes  Recommendations for Outpatient Follow-up:  -Advised to follow up with rheumatology as an outpatient.   Discharge Diagnoses:  Principal Problem:   FUO (fever of unknown origin) Active Problems:   DIVERTICULOSIS, [place]   Chronic pain   Discharge Condition: Stable and improved,  Filed Weights   08/17/13 1244  Weight: 150.5 kg (331 lb 12.7 oz)    History of present illness:  Patient is a 57 y.o. female with Past medical history of fibromyalgia, hypertension, GERD, diverticulosis, right-sided renal cyst.  She presented today to ER with the complaint of sudden onset of fever and chills. She mentions that she has similar episodes in the past with a recent episode was in June 2014.  She had been extensively worked up for the same symptoms, and was referred to rheumatologist recently by her PCP she has chronic back pain due to her fibromyalgia. We were asked to admit her for further evaluation and management.   Hospital Course:   FUO  -Has been ongoing for over a year. -Has had all infectious workup and has seen Dr. Ninetta Lights in the office.  -Plan was for rheumatology referral, but she never followed up.  -Initial ANA is positive, (??but with negative titer??) -Off all antibiotics, not febrile overnight. -All cx data has been negative. -She has had multiple CT scans over the last 18 months all negative; would refrain from ordering repeat CTs if she returns to the hospital. -Suspect she may have an autoimmune component to her FUO and Dr. Ninetta Lights agrees. Will follow up with rheumatology on DC.  Leukocytosis  -Resolved.   Procedures:  None   Consultations:  None  Discharge Instructions  Discharge Orders   Future Orders Complete By Expires   Diet - low sodium heart healthy  As directed     Discontinue IV  As directed    Increase activity slowly  As directed        Medication List         acetaminophen 325 MG tablet  Commonly known as:  TYLENOL  Take 2 tablets (650 mg total) by mouth every 6 (six) hours as needed.     albuterol 108 (90 BASE) MCG/ACT inhaler  Commonly known as:  PROVENTIL HFA;VENTOLIN HFA  Inhale 2 puffs into the lungs every 4 (four) hours as needed for wheezing (cough, shortness of breath or wheezing.).     ALPRAZolam 0.5 MG tablet  Commonly known as:  XANAX  Take 1 tablet (0.5 mg total) by mouth 2 (two) times daily as needed for anxiety.     citalopram 10 MG tablet  Commonly known as:  CELEXA  Take 1 tablet (10 mg total) by mouth daily.     cyclobenzaprine 5 MG tablet  Commonly known as:  FLEXERIL  Take 1 tablet (5 mg total) by mouth 3 (three) times daily as needed for muscle spasms.     hydrochlorothiazide 25 MG tablet  Commonly known as:  HYDRODIURIL  TAKE ONE TABLET BY MOUTH EVERY DAY     loperamide 2 MG capsule  Commonly known as:  IMODIUM  Take 2 mg by mouth 4 (four) times daily as needed for diarrhea or loose stools (for upset stomach).     meclizine 12.5 MG tablet  Commonly known as:  ANTIVERT  Take 1 tablet (12.5 mg total) by mouth 3 (  three) times daily as needed.     ondansetron 4 MG tablet  Commonly known as:  ZOFRAN  Take 1 tablet (4 mg total) by mouth every 6 (six) hours as needed for nausea.     oxyCODONE 5 MG immediate release tablet  Commonly known as:  Oxy IR/ROXICODONE  1-2 tabs by mouth every 6 hrs as needed for pain       Allergies  Allergen Reactions  . Amoxicillin-Pot Clavulanate Nausea And Vomiting  . Codeine Other (See Comments)    hallucinations  . Gluten Meal   . Lactose Intolerance (Gi)   . Morphine And Related Other (See Comments)    headaches  . Vicodin [Hydrocodone-Acetaminophen] Other (See Comments)    hallucinations  . Latex Rash       Follow-up Information   Follow up with Oliver Barre, MD.  Schedule an appointment as soon as possible for a visit in 2 weeks.   Specialties:  Internal Medicine, Radiology   Contact information:   [address] [place] [address] [phone]        The results of significant diagnostics from this hospitalization (including imaging, microbiology, ancillary and laboratory) are listed below for reference.    Significant Diagnostic Studies: Dg Chest 2 View  08/17/2013   *RADIOLOGY REPORT*  Clinical Data: Weakness, fever  CHEST - 2 VIEW  Comparison: 11/09/2012  Findings: Stable cardiomediastinal contours.  No consolidation, pleural effusion, pneumothorax.  Cervical fusion hardware.  No acute osseous finding.  IMPRESSION: No radiographic evidence of acute cardiopulmonary process.   Original Report Authenticated By: Jearld Lesch, M.D.   Ct Abdomen Pelvis W Contrast  08/17/2013   *RADIOLOGY REPORT*  Clinical Data: Diffuse abdominal pain.  History of diverticulitis. Right-sided flank pain.  CT ABDOMEN AND PELVIS WITH CONTRAST  Technique:  Multidetector CT imaging of the abdomen and pelvis was performed following the standard protocol during bolus administration of intravenous contrast.  Contrast: 50mL OMNIPAQUE IOHEXOL 300 MG/ML  SOLN, OMNIPAQUE IOHEXOL 300 MG/ML  SOLN  Comparison: CT of the abdomen and pelvis 06/07/2013.  Findings:  Lung Bases: Unremarkable.  Abdomen/Pelvis:  The hepatic parenchyma is diffusely low attenuation, suggestive of hepatic steatosis (difficult to assess for certain on the contrast enhanced CT examination).  The appearance of the gallbladder, pancreas, spleen, bilateral adrenal glands and the left kidney is unremarkable.  Subcentimeter low attenuation lesion in the lower pole of the right kidney is too small to definitively characterize, but is similar to the prior study, and favored to represent a small cyst.  There are numerous colonic diverticula, particularly in the region of the sigmoid [place], without  surrounding inflammatory changes to suggest acute diverticulitis at this time.  Normal appendix.  No significant volume of ascites.  No pneumoperitoneum.  No pathologic distension of small bowel.  No definite pathologic lymphadenopathy identified within the abdomen or pelvis.  Mild atherosclerosis throughout the abdominal and pelvic vasculature, without evidence of aneurysm or dissection.  Status post hysterectomy.  Ovaries are not confidently identified may be surgically absent or atrophic. Urinary bladder is unremarkable in appearance.  Musculoskeletal: There are no aggressive appearing lytic or blastic lesions noted in the visualized portions of the skeleton.  IMPRESSION: 1.  No acute findings in the abdomen or pelvis to account for the patient's symptoms. 2.  Colonic diverticulosis without findings to suggest acute diverticulitis at this time. 3.  Normal appendix. 4.  Probable hepatic steatosis. 5.  Additional findings, as above.   Original  Report Authenticated By: Trudie Reed, M.D.    Microbiology: Recent Results (from the past 240 hour(s))  CULTURE, BLOOD (ROUTINE X 2)     Status: None   Collection Time    08/17/13  5:41 AM      Result Value Range Status   Specimen Description BLOOD RIGHT HAND   Final   Special Requests BOTTLES DRAWN AEROBIC AND ANAEROBIC 5CC   Final   Culture  Setup Time     Final   Value: 08/17/2013 10:00     Performed at [facility]   Culture     Final   Value:        BLOOD CULTURE RECEIVED NO GROWTH TO DATE CULTURE WILL BE HELD FOR 5 DAYS BEFORE ISSUING A FINAL NEGATIVE REPORT     Performed at [facility]   Report Status PENDING   Incomplete  CULTURE, BLOOD (ROUTINE X 2)     Status: None   Collection Time    08/17/13  5:45 AM      Result Value Range Status   Specimen Description BLOOD LEFT HAND   Final   Special Requests BOTTLES DRAWN AEROBIC AND ANAEROBIC 5CC   Final   Culture  Setup Time     Final   Value: 08/17/2013 10:00     Performed at  [facility]   Culture     Final   Value:        BLOOD CULTURE RECEIVED NO GROWTH TO DATE CULTURE WILL BE HELD FOR 5 DAYS BEFORE ISSUING A FINAL NEGATIVE REPORT     Performed at [facility]   Report Status PENDING   Incomplete     Labs: Basic Metabolic Panel:  Recent Labs Lab 08/17/13 0356 08/17/13 0423 08/18/13 0502 08/19/13 0525  NA 137 140 137 138  K 3.2* 3.7 3.6 4.1  CL 96 98 101 103  CO2 30  --  30 28  GLUCOSE 114* 115* 105* 132*  BUN 8 7 11 11   CREATININE 0.84 1.10 0.89 0.74  CALCIUM 9.4  --  8.2* 8.3*   Liver Function Tests:  Recent Labs Lab 08/17/13 0356  AST 35  ALT 50*  ALKPHOS 86  BILITOT 0.5  PROT 7.6  ALBUMIN 4.0    Recent Labs Lab 08/17/13 0356  LIPASE 23   No results found for this basename: AMMONIA,  in the last 168 hours CBC:  Recent Labs Lab 08/17/13 0359 08/17/13 0423 08/18/13 0502 08/19/13 0525  WBC 20.9*  --  13.6* 9.2  NEUTROABS 15.0*  --   --   --   HGB 15.5* 16.3* 12.7 12.5  HCT 45.3 48.0* 37.9 37.8  MCV 93.4  --  93.1 92.9  PLT 284  --  226 192   Cardiac Enzymes: No results found for this basename: CKTOTAL, CKMB, CKMBINDEX, TROPONINI,  in the last 168 hours BNP: BNP (last 3 results) No results found for this basename: PROBNP,  in the last 8760 hours CBG:  Recent Labs Lab 08/17/13 1726 08/18/13 0747 08/19/13 0747  GLUCAP 116* 103* 153*       Signed:  HERNANDEZ ACOSTA,ESTELA  [facility] Pager: [phone] 08/19/2013, 11:45 AM

## 2013-08-23 LAB — CULTURE, BLOOD (ROUTINE X 2): Culture: NO GROWTH

## 2013-08-31 ENCOUNTER — Other Ambulatory Visit (INDEPENDENT_AMBULATORY_CARE_PROVIDER_SITE_OTHER): Payer: Self-pay

## 2013-08-31 ENCOUNTER — Encounter: Payer: Self-pay | Admitting: Internal Medicine

## 2013-08-31 ENCOUNTER — Ambulatory Visit (INDEPENDENT_AMBULATORY_CARE_PROVIDER_SITE_OTHER): Payer: Self-pay | Admitting: Internal Medicine

## 2013-08-31 VITALS — BP 128/80 | HR 89 | Temp 99.0°F | Ht 66.0 in | Wt 327.0 lb

## 2013-08-31 DIAGNOSIS — Z23 Encounter for immunization: Secondary | ICD-10-CM

## 2013-08-31 DIAGNOSIS — M545 Low back pain, unspecified: Secondary | ICD-10-CM

## 2013-08-31 DIAGNOSIS — R7309 Other abnormal glucose: Secondary | ICD-10-CM

## 2013-08-31 DIAGNOSIS — I10 Essential (primary) hypertension: Secondary | ICD-10-CM

## 2013-08-31 DIAGNOSIS — R7302 Impaired glucose tolerance (oral): Secondary | ICD-10-CM

## 2013-08-31 DIAGNOSIS — G8929 Other chronic pain: Secondary | ICD-10-CM

## 2013-08-31 LAB — HEMOGLOBIN A1C: Hgb A1c MFr Bld: 6.3 % (ref 4.6–6.5)

## 2013-08-31 NOTE — Progress Notes (Signed)
Subjective:    Patient ID: Maria [name], female    DOB: 08-02-1956, 57 y.o.   MRN: [id number]  HPI  Here to f/u, no further chills or fever; Pt continues to have recurring LBP without change in severity, bowel or bladder change, fever, wt loss,  With mild worsening LE pain/weakness, with mild gait change, no falls.  Pt requrests rheum referral, Dr Zenovia Jordan.  Still with spasms to the lower steranl area with eating corn or spinach, but not usually liquids .  TUMS helped somewhat.  Bp has been somewhat labile recent, but usually < 140/90.  Has had several midl elev BS's recent, want sthe A1c don, though no insurance at this time, states wants test anyway.   Past Medical History  Diagnosis Date  . Acute lymphadenitis 08/13/2009  . Acute pharyngitis 02/10/2010  . ALLERGIC RHINITIS 08/10/2007  . ANXIETY 12/06/2007  . BACK PAIN 12/06/2007  . CHEST PAIN 08/13/2009  . DEPRESSION 12/06/2007  . DIVERTICULOSIS, [place] 12/06/2007  . GERD 12/06/2007  . HYPERTENSION 12/06/2007  . LOW BACK PAIN 12/06/2007  . MRSA 02/07/2009  . Pain in joint, multiple sites 04/21/2010  . PARONYCHIA, FINGER 08/13/2009  . RASH-NONVESICULAR 05/09/2008  . SHOULDER PAIN, LEFT 05/09/2008  . SINUSITIS- ACUTE-NOS 05/09/2008  . THORACIC/LUMBOSACRAL NEURITIS/RADICULITIS UNSPEC 12/28/2008  . Lumbar disc disease 03/09/2012  . Cervical disc disease 03/09/2012  . Chronic pain 03/09/2012  . Impaired glucose tolerance 03/13/2012  . Cellulitis   . Atherosclerotic peripheral vascular disease 06/13/2013    Aorta on CT June 2014   Past Surgical History  Procedure Laterality Date  . Abdominal hysterectomy    . S/p ovary cyst    . S/p right knee arthroscopy      Dr. Dion Saucier ortho  . Cervical disc surgery      reports that she quit smoking about 4 years ago. She has never used smokeless tobacco. She reports that she does not drink alcohol or use illicit drugs. family history includes Asthma in her daughter and sister; Cancer in her maternal  uncle and other; Diabetes in her mother; Heart disease in her mother; Hypertension in her mother. Allergies  Allergen Reactions  . Amoxicillin-Pot Clavulanate Nausea And Vomiting  . Codeine Other (See Comments)    hallucinations  . Gluten Meal   . Lactose Intolerance (Gi)   . Morphine And Related Other (See Comments)    headaches  . Vicodin [Hydrocodone-Acetaminophen] Other (See Comments)    hallucinations  . Latex Rash   Current Outpatient Prescriptions on File Prior to Visit  Medication Sig Dispense Refill  . acetaminophen (TYLENOL) 325 MG tablet Take 2 tablets (650 mg total) by mouth every 6 (six) hours as needed.      Marland Kitchen albuterol (PROVENTIL HFA;VENTOLIN HFA) 108 (90 BASE) MCG/ACT inhaler Inhale 2 puffs into the lungs every 4 (four) hours as needed for wheezing (cough, shortness of breath or wheezing.).  1 Inhaler  5  . ALPRAZolam (XANAX) 0.5 MG tablet Take 1 tablet (0.5 mg total) by mouth 2 (two) times daily as needed for anxiety.  60 tablet  2  . cyclobenzaprine (FLEXERIL) 5 MG tablet Take 1 tablet (5 mg total) by mouth 3 (three) times daily as needed for muscle spasms.  90 tablet  2  . hydrochlorothiazide (HYDRODIURIL) 25 MG tablet TAKE ONE TABLET BY MOUTH EVERY DAY  90 tablet  3  . loperamide (IMODIUM) 2 MG capsule Take 2 mg by mouth 4 (four) times daily as needed for diarrhea or  loose stools (for upset stomach).       . meclizine (ANTIVERT) 12.5 MG tablet Take 1 tablet (12.5 mg total) by mouth 3 (three) times daily as needed.  30 tablet  1  . ondansetron (ZOFRAN) 4 MG tablet Take 1 tablet (4 mg total) by mouth every 6 (six) hours as needed for nausea.  20 tablet  0  . oxyCODONE (OXY IR/ROXICODONE) 5 MG immediate release tablet 1-2 tabs by mouth every 6 hrs as needed for pain  240 tablet  0  . [DISCONTINUED] DULoxetine (CYMBALTA) 60 MG capsule Take 60 mg by mouth daily.        . [DISCONTINUED] fexofenadine (ALLEGRA) 180 MG tablet Take 180 mg by mouth daily.        . [DISCONTINUED]  furosemide (LASIX) 40 MG tablet Take 40 mg by mouth daily.         No current facility-administered medications on file prior to visit.   Review of Systems  Constitutional: Negative for unexpected weight change, or unusual diaphoresis  HENT: Negative for tinnitus.   Eyes: Negative for photophobia and visual disturbance.  Respiratory: Negative for choking and stridor.   Gastrointestinal: Negative for vomiting and blood in stool.  Genitourinary: Negative for hematuria and decreased urine volume.  Musculoskeletal: Negative for acute joint swelling Skin: Negative for color change and wound.  Neurological: Negative for tremors and numbness other than noted  Psychiatric/Behavioral: Negative for decreased concentration or  hyperactivity.       Objective:   Physical Exam BP 128/80  Pulse 89  Temp(Src) 99 F (37.2 C) (Oral)  Ht 5\' 6"  (1.676 m)  Wt 327 lb (148.326 kg)  BMI 52.8 kg/m2  SpO2 95% VS noted,  Constitutional: Pt appears morbid obese HENT: Head: NCAT.  Right Ear: External ear normal.  Left Ear: External ear normal.  Eyes: Conjunctivae and EOM are normal. Pupils are equal, round, and reactive to light.  Neck: Normal range of motion. Neck supple.  Cardiovascular: Normal rate and regular rhythm.   Pulmonary/Chest: Effort normal and breath sounds normal.  Abd:  Soft, NT, non-distended, + BS Spine; diffuse lower lumbar tender without swelling Neurological: Pt is alert. Not confused , motor 5/5, dtr intact Skin: Skin is warm. No erythema. No LE edema Psychiatric: Pt behavior is normal. Thought content normal. 1-2+ nervous    Assessment & Plan:

## 2013-08-31 NOTE — Assessment & Plan Note (Signed)
stable overall by history and exam, recent data reviewed with pt, and pt to continue medical treatment as before,  to f/u any worsening symptoms or concerns Lab Results  Component Value Date   HGBA1C  Value: 5.4 (NOTE) The ADA recommends the following therapeutic goal for glycemic control related to Hgb A1c measurement: Goal of therapy: <6.5 Hgb A1c  Reference: American Diabetes Association: Clinical Practice Recommendations 2010, Diabetes Care, 2010, 33: (Suppl  1). 05/30/2009

## 2013-08-31 NOTE — Patient Instructions (Signed)
You had the flu shot today Please continue all other medications as before, and refills have been done if requested. Please have the pharmacy call with any other refills you may need. Please go to the LAB in the Basement (turn left off the elevator) for the tests to be done today - just the A1c test You will be contacted by phone if any changes need to be made immediately.  Otherwise, you will receive a letter about your results with an explanation, but please check with MyChart first. You will be contacted regarding the referral for: rheumatology  Please remember to sign up for My Chart if you have not done so, as this will be important to you in the future with finding out test results, communicating by private email, and scheduling acute appointments online when needed.

## 2013-08-31 NOTE — Assessment & Plan Note (Signed)
stable overall by history and exam, recent data reviewed with pt, and pt to continue medical treatment as before,  to f/u any worsening symptoms or concerns BP Readings from Last 3 Encounters:  08/31/13 128/80  08/19/13 166/82  08/15/13 120/72

## 2013-08-31 NOTE — Assessment & Plan Note (Signed)
Lower back worsening at this time, for rheum referral

## 2013-09-26 ENCOUNTER — Other Ambulatory Visit: Payer: Self-pay

## 2013-09-26 MED ORDER — OXYCODONE HCL 5 MG PO TABS: ORAL_TABLET | ORAL | Status: DC

## 2013-09-26 NOTE — Telephone Encounter (Signed)
Called the patient informed to pickup hardcopy at the front desk. 

## 2013-09-26 NOTE — Telephone Encounter (Signed)
Done hardcopy to robin  

## 2013-09-28 ENCOUNTER — Other Ambulatory Visit: Payer: Self-pay | Admitting: Internal Medicine

## 2013-09-28 NOTE — Telephone Encounter (Signed)
Done hardcopy to robin  

## 2013-09-28 NOTE — Telephone Encounter (Signed)
Faxed hardcopy to [facility].

## 2013-10-03 ENCOUNTER — Telehealth: Payer: Self-pay

## 2013-10-03 MED ORDER — ALBUTEROL SULFATE HFA 108 (90 BASE) MCG/ACT IN AERS: 2.0000 | INHALATION_SPRAY | RESPIRATORY_TRACT | Status: DC | PRN

## 2013-10-03 NOTE — Telephone Encounter (Signed)
Refill inhaler per pt. request

## 2013-10-12 ENCOUNTER — Other Ambulatory Visit: Payer: Self-pay | Admitting: Internal Medicine

## 2013-10-13 NOTE — Telephone Encounter (Signed)
Done hardcopy to robin  

## 2013-10-13 NOTE — Telephone Encounter (Signed)
Faxed hardcopy to [facility]

## 2013-10-19 ENCOUNTER — Telehealth: Payer: Self-pay

## 2013-10-19 DIAGNOSIS — M542 Cervicalgia: Secondary | ICD-10-CM

## 2013-10-19 NOTE — Telephone Encounter (Signed)
The patient was unable to do MRI referral due to money.  But would like PCP to put a referral in for the MRI for her neck pain today as she found out she can make payments to [facility].

## 2013-10-19 NOTE — Telephone Encounter (Signed)
Ok, will do

## 2013-10-19 NOTE — Telephone Encounter (Signed)
Patient informed. 

## 2013-10-28 ENCOUNTER — Ambulatory Visit
Admission: RE | Admit: 2013-10-28 | Discharge: 2013-10-28 | Disposition: A | Discharge status: Home / Self Care | Payer: No Typology Code available for payment source | Source: Ambulatory Visit | Attending: Internal Medicine | Admitting: Internal Medicine

## 2013-10-28 DIAGNOSIS — M542 Cervicalgia: Secondary | ICD-10-CM

## 2013-10-31 ENCOUNTER — Telehealth: Payer: Self-pay | Admitting: Internal Medicine

## 2013-10-31 DIAGNOSIS — M5412 Radiculopathy, cervical region: Secondary | ICD-10-CM

## 2013-10-31 DIAGNOSIS — M5090 Cervical disc disorder, unspecified, unspecified cervical region: Secondary | ICD-10-CM

## 2013-10-31 MED ORDER — OXYCODONE HCL 5 MG PO TABS: ORAL_TABLET | ORAL | Status: DC

## 2013-10-31 NOTE — Telephone Encounter (Signed)
Message copied by Corwin Levins on Tue Oct 31, 2013 12:55 PM ------      Message from: Scharlene Gloss B      Created: Tue Oct 31, 2013  8:15 AM       Called the patient informed of results and she would like a referral at this time. ------

## 2013-10-31 NOTE — Telephone Encounter (Signed)
Done per emr 

## 2013-10-31 NOTE — Telephone Encounter (Signed)
Message copied by Corwin Levins on Tue Oct 31, 2013  7:08 PM ------      Message from: Pincus Sanes      Created: Tue Oct 31, 2013  4:43 PM      Regarding: RE: insurance issue       Contacted the patient and she would like a referral back to Dr. Allena Katz at [facility] please.   Also she would like a refill on her oxycodone.       ----- Message -----         From: Corwin Levins, MD         Sent: 10/31/2013   3:52 PM           To: Vladimir Crofts Ewing      Subject: insurance issue                                          [name] to let pt know;  I can refer to orthopedic locally, but they most often require $200 upfront to be seen, and I dont know of any NS that would see her without ins as well; to let me know if she wants ortho local referral      ----- Message -----         From: Janee Morn         Sent: 10/31/2013   3:18 PM           To: Corwin Levins, MD            Dr Allena Katz @ [facility] does not accept patients with no insurance. Please Advise             ------

## 2013-10-31 NOTE — Telephone Encounter (Signed)
Unfortunately, as per the previous message, Dr Allena Katz is apparently not accepting patients who are currently uninsured; can I refer to a local pharmacy?  OK for oxycodone, Done hardcopy to robin, but also to let pt know:  You are given the letter today explaining the transitional pain medication refill policy due to recent [place] law change  Please be aware that I will no longer be able to offer monthly refills of any Schedule II or higher medication starting Jan 21, 2014

## 2013-11-01 NOTE — Telephone Encounter (Signed)
Called the patient informed to pickup hardcopy's at the front and did explain enclosed letter explaining change in pain medication refill policy per MD instructions.  ALSO the patient does want a local referral please.

## 2013-11-01 NOTE — Telephone Encounter (Signed)
Done per emr 

## 2014-01-19 ENCOUNTER — Other Ambulatory Visit: Payer: Self-pay | Admitting: Internal Medicine

## 2014-01-19 NOTE — Telephone Encounter (Signed)
Done hardcopy to robin  

## 2014-01-19 NOTE — Telephone Encounter (Signed)
Faxed hardcopy to [place]

## 2014-01-23 ENCOUNTER — Other Ambulatory Visit: Payer: Self-pay

## 2014-01-23 MED ORDER — ALPRAZOLAM 0.5 MG PO TABS: ORAL_TABLET | ORAL | Status: DC

## 2014-01-23 NOTE — Telephone Encounter (Signed)
Done hardcopy to robin  

## 2014-01-23 NOTE — Telephone Encounter (Signed)
Faxed hardcopy to [facility]

## 2014-02-09 ENCOUNTER — Telehealth: Payer: Self-pay

## 2014-02-09 MED ORDER — TRAMADOL HCL 50 MG PO TABS: 50.0000 mg | ORAL_TABLET | Freq: Three times a day (TID) | ORAL | Status: DC | PRN

## 2014-02-09 NOTE — Telephone Encounter (Signed)
Faxed hardcopy to [facility] and called the patient to inform script request has been done.

## 2014-02-09 NOTE — Telephone Encounter (Signed)
The patient is aware that Dr. [name] Reichmann will no longer fill her oxycodone.  She states she has no insurance at this time and would not be able to go to pain management.  The patient would like to know if PCP would be ok with prescribing Tramadol in place of the oxycodone for her??  Advise please as she does not have any other options at this time.

## 2014-02-09 NOTE — Telephone Encounter (Signed)
Done hardcopy to robin  

## 2014-04-02 ENCOUNTER — Telehealth: Payer: Self-pay

## 2014-04-02 NOTE — Telephone Encounter (Signed)
Patient informed of MD instructions. 

## 2014-04-02 NOTE — Telephone Encounter (Signed)
Very sorry, due to recent changes in government oversight, I no longer feel comfortable with practicing chronic pain management; I am no longer able to provide medication for chronic pain. Pt had received letter Nov 2014 regarding this.

## 2014-04-02 NOTE — Telephone Encounter (Signed)
The patient called to inform Tramadol is not working well for her pain.  She should get her medicaid in 2 weeks and will then go to pain management.  She would like to know if she could get a 2 week refill on her percocet until seeing the pain MD.  Advise.   Call back number is [phone]

## 2014-04-04 ENCOUNTER — Emergency Department (HOSPITAL_COMMUNITY): Payer: Medicaid Other

## 2014-04-04 ENCOUNTER — Encounter (HOSPITAL_COMMUNITY): Payer: Self-pay | Admitting: Emergency Medicine

## 2014-04-04 ENCOUNTER — Emergency Department (HOSPITAL_COMMUNITY)
Admission: EM | Admit: 2014-04-04 | Discharge: 2014-04-04 | Disposition: A | Discharge status: Home / Self Care | Payer: Medicaid Other | Attending: Emergency Medicine | Admitting: Emergency Medicine

## 2014-04-04 DIAGNOSIS — Z872 Personal history of diseases of the skin and subcutaneous tissue: Secondary | ICD-10-CM | POA: Insufficient documentation

## 2014-04-04 DIAGNOSIS — F411 Generalized anxiety disorder: Secondary | ICD-10-CM | POA: Diagnosis not present

## 2014-04-04 DIAGNOSIS — Z8709 Personal history of other diseases of the respiratory system: Secondary | ICD-10-CM | POA: Insufficient documentation

## 2014-04-04 DIAGNOSIS — G8921 Chronic pain due to trauma: Secondary | ICD-10-CM | POA: Insufficient documentation

## 2014-04-04 DIAGNOSIS — I10 Essential (primary) hypertension: Secondary | ICD-10-CM | POA: Diagnosis not present

## 2014-04-04 DIAGNOSIS — G8929 Other chronic pain: Secondary | ICD-10-CM | POA: Insufficient documentation

## 2014-04-04 DIAGNOSIS — Z8719 Personal history of other diseases of the digestive system: Secondary | ICD-10-CM | POA: Diagnosis not present

## 2014-04-04 DIAGNOSIS — Z91040 Latex allergy status: Secondary | ICD-10-CM | POA: Diagnosis not present

## 2014-04-04 DIAGNOSIS — IMO0002 Reserved for concepts with insufficient information to code with codable children: Secondary | ICD-10-CM

## 2014-04-04 DIAGNOSIS — F329 Major depressive disorder, single episode, unspecified: Secondary | ICD-10-CM | POA: Diagnosis not present

## 2014-04-04 DIAGNOSIS — Z87891 Personal history of nicotine dependence: Secondary | ICD-10-CM | POA: Insufficient documentation

## 2014-04-04 DIAGNOSIS — Z8614 Personal history of Methicillin resistant Staphylococcus aureus infection: Secondary | ICD-10-CM | POA: Insufficient documentation

## 2014-04-04 DIAGNOSIS — M549 Dorsalgia, unspecified: Secondary | ICD-10-CM | POA: Diagnosis present

## 2014-04-04 DIAGNOSIS — F3289 Other specified depressive episodes: Secondary | ICD-10-CM | POA: Diagnosis not present

## 2014-04-04 DIAGNOSIS — Z880 Allergy status to penicillin: Secondary | ICD-10-CM | POA: Diagnosis not present

## 2014-04-04 DIAGNOSIS — M47817 Spondylosis without myelopathy or radiculopathy, lumbosacral region: Secondary | ICD-10-CM | POA: Diagnosis not present

## 2014-04-04 MED ORDER — OXYCODONE-ACETAMINOPHEN 5-325 MG PO TABS: 1.0000 | ORAL_TABLET | ORAL | Status: DC | PRN

## 2014-04-04 NOTE — ED Provider Notes (Signed)
Medical screening examination/treatment/procedure(s) were performed by non-physician practitioner and as supervising physician I was immediately available for consultation/collaboration.   EKG Interpretation None       Threasa Beards, MD 04/04/14 [phone]

## 2014-04-04 NOTE — Discharge Instructions (Signed)
Naprosyn for pain. Percocet for severe pain. Follow up with primary care doctor. Return if worsening symptoms, new numbness or weakness in extremities, fever, problems controlling your bladder or bowels.    Chronic Back Pain  When back pain lasts longer than 3 months, it is called chronic back pain.People with chronic back pain often go through certain periods that are more intense (flare-ups).  CAUSES Chronic back pain can be caused by wear and tear (degeneration) on different structures in your back. These structures include:  The bones of your spine (vertebrae) and the joints surrounding your spinal cord and nerve roots (facets).  The strong, fibrous tissues that connect your vertebrae (ligaments). Degeneration of these structures may result in pressure on your nerves. This can lead to constant pain. HOME CARE INSTRUCTIONS  Avoid bending, heavy lifting, prolonged sitting, and activities which make the problem worse.  Take brief periods of rest throughout the day to reduce your pain. Lying down or standing usually is better than sitting while you are resting.  Take over-the-counter or prescription medicines only as directed by your caregiver. SEEK IMMEDIATE MEDICAL CARE IF:   You have weakness or numbness in one of your legs or feet.  You have trouble controlling your bladder or bowels.  You have nausea, vomiting, abdominal pain, shortness of breath, or fainting. Document Released: 01/14/2005 Document Revised: 02/29/2012 Document Reviewed: 11/21/2011 [facility] Patient Information 2014 [place], [place].

## 2014-04-04 NOTE — ED Provider Notes (Signed)
CSN: [id number]     Arrival date & time 04/04/14  1210 History   This chart was scribed for non-physician practitioner working with Jeannett Senior, by Allena Earing ED Scribe. This patient was seen in WTR7/WTR7 and the patient's care was started at 12:35 PM.    Chief Complaint  Patient presents with  . Back Pain     The history is provided by the patient. No language interpreter was used.    HPI Comments: Maria [name] is a 58 y.o. female with h/o lower back pain, lumbar and cervical disc disease who presents to the Emergency Department complaining of chronic lower back pain that began with an accident at work in 2010. Pt states that the pain radiates down her legs and around into her groin. She states that while sitting during her exam that her feet were going numb. Pt currently does not have a doctor as she is waiting for her medicaid hearing. Pt has been coming to the ED for her pain medication as she can no longer see her PCP as she has no insurance. She states her PCP told her to come to the ED to get pain medication. Pt states that tramadol provides little effect and that she has had the most pain relief with percocet.  Pt has fibromyalgia.  Pt has not had an xray of her back in "a while".   Past Medical History  Diagnosis Date  . Acute lymphadenitis 08/13/2009  . Acute pharyngitis 02/10/2010  . ALLERGIC RHINITIS 08/10/2007  . ANXIETY 12/06/2007  . BACK PAIN 12/06/2007  . CHEST PAIN 08/13/2009  . DEPRESSION 12/06/2007  . DIVERTICULOSIS, [place] 12/06/2007  . GERD 12/06/2007  . HYPERTENSION 12/06/2007  . LOW BACK PAIN 12/06/2007  . MRSA 02/07/2009  . Pain in joint, multiple sites 04/21/2010  . PARONYCHIA, FINGER 08/13/2009  . RASH-NONVESICULAR 05/09/2008  . SHOULDER PAIN, LEFT 05/09/2008  . SINUSITIS- ACUTE-NOS 05/09/2008  . THORACIC/LUMBOSACRAL NEURITIS/RADICULITIS UNSPEC 12/28/2008  . Lumbar disc disease 03/09/2012  . Cervical disc disease 03/09/2012  . Chronic pain 03/09/2012   . Impaired glucose tolerance 03/13/2012  . Cellulitis   . Atherosclerotic peripheral vascular disease 06/13/2013    Aorta on CT June 2014   Past Surgical History  Procedure Laterality Date  . Abdominal hysterectomy    . S/p ovary cyst    . S/p right knee arthroscopy      Dr. Mardelle Matte ortho  . Cervical disc surgery     Family History  Problem Relation Age of Onset  . Heart disease Mother   . Hypertension Mother   . Diabetes Mother   . Asthma Sister   . Asthma Daughter   . Cancer Maternal Uncle     [place]  . Cancer Other     ovarian   History  Substance Use Topics  . Smoking status: Former Smoker -- 30 years    Quit date: 11/08/2008  . Smokeless tobacco: Never Used     Comment: quit 10/09  . Alcohol Use: No   OB History   Grav Para Term Preterm Abortions TAB SAB Ect Mult Living                 Review of Systems  Musculoskeletal: Positive for arthralgias and back pain.  All other systems reviewed and are negative.     Allergies  Amoxicillin-pot clavulanate; Codeine; Gluten meal; Lactose intolerance (gi); Morphine and related; Vicodin; and Latex  Home Medications   Prior to Admission medications   Medication Sig  Start Date End Date Taking? Authorizing Provider  ALPRAZolam Duanne Moron) 0.5 MG tablet take 1 tablet by mouth twice a day if needed for anxiety 01/23/14  Yes Biagio Borg, MD  clonazePAM (KLONOPIN) 1 MG tablet TAKE 1/2 TO 1 TABLET TWICE DAILY AS NEEDED. 01/19/14  Yes Biagio Borg, MD  cyclobenzaprine (FLEXERIL) 5 MG tablet Take 1 tablet (5 mg total) by mouth 3 (three) times daily as needed for muscle spasms. 08/15/13  Yes Biagio Borg, MD  traMADol (ULTRAM) 50 MG tablet Take 1 tablet (50 mg total) by mouth every 8 (eight) hours as needed. 02/09/14  Yes Biagio Borg, MD  acetaminophen (TYLENOL) 325 MG tablet Take 2 tablets (650 mg total) by mouth every 6 (six) hours as needed. 08/19/13   Erline Hau, MD  albuterol (PROVENTIL HFA;VENTOLIN HFA) 108 (90 BASE)  MCG/ACT inhaler Inhale 2 puffs into the lungs every 4 (four) hours as needed for wheezing (cough, shortness of breath or wheezing.). 10/03/13   Biagio Borg, MD  hydrochlorothiazide (HYDRODIURIL) 25 MG tablet TAKE ONE TABLET BY MOUTH EVERY DAY 07/10/13   Biagio Borg, MD  loperamide (IMODIUM) 2 MG capsule Take 2 mg by mouth 4 (four) times daily as needed for diarrhea or loose stools (for upset stomach).     Historical Provider, MD  meclizine (ANTIVERT) 12.5 MG tablet Take 1 tablet (12.5 mg total) by mouth 3 (three) times daily as needed. 08/15/13   Biagio Borg, MD  ondansetron (ZOFRAN) 4 MG tablet Take 1 tablet (4 mg total) by mouth every 6 (six) hours as needed for nausea. 08/19/13   Erline Hau, MD  oxyCODONE-acetaminophen (PERCOCET/ROXICET) 5-325 MG per tablet Take by mouth every 4 (four) hours as needed for severe pain.    Historical Provider, MD   BP 180/78  Pulse 96  Temp(Src) 98.8 F (37.1 C) (Oral)  Resp 20  SpO2 98% Physical Exam  Nursing note and vitals reviewed. Constitutional: She is oriented to person, place, and time. She appears well-developed and well-nourished.  HENT:  Head: Normocephalic and atraumatic.  Eyes: Pupils are equal, round, and reactive to light.  Neck: Normal range of motion.  Pulmonary/Chest: Effort normal.  Abdominal: Soft. Bowel sounds are normal. There is no tenderness.  Musculoskeletal:       Lumbar back: She exhibits tenderness.  Midline lumbar spine tenderness and sacral tenderness. Pain with bilateral straight leg raise. No swelling, bruising, deformity noted to the lumbar spine. Tender over bilateral SI joint and buttock.  Neurological: She is alert and oriented to person, place, and time.  5/5 and equal lower extremity strength. 2+ and equal patellar reflexes bilaterally. Pt able to dorsiflex bilateral toes and feet with good strength against resistance. Equal sensation bilaterally over thighs and lower legs.   Psychiatric: Her behavior  is normal.    ED Course  Procedures (including critical care time)  DIAGNOSTIC STUDIES: Oxygen Saturation is 98% on RA, normal by my interpretation.    COORDINATION OF CARE:   12:42 PM-Discussed treatment plan which includes xray with pt at bedside and pt agreed to plan.    Labs Review Labs Reviewed - No data to display  Imaging Review No results found.   EKG Interpretation None      MDM   Final diagnoses:  Chronic back pain  Degenerative disc disease   Patient is an emergency department with chronic back pain. She has not had any recent or new injuries. States her pain  has been managed by her primary care Dr. in the past, she has been treated with Percocet for pain. She states she's unable to see her primary care doctor any longer because she lost her insurance, she is currently waiting on Medicaid. Patient unable to get in anywhere to be seen for this. On the exam there is no red 5 suggesting cauda equina. She is ambulatory with a cane. She's afebrile. I reviewed her prior CT abdomen pelvis, no signs of aortic aneurysms. She is neurovascularly intact. X-ray today showed some degenerative disc changes otherwise no acute findings. Patient is requesting Percocet for pain. She is driving home, no medications given in emergency department. I will prescribe her 20 tablets of Percocet, instructed her to followup as soon as possible. Blood pressure is elevated, most likely due to pain. Will recheck.   Filed Vitals:   04/04/14 1215  BP: 180/78  Pulse: 96  Temp: 98.8 F (37.1 C)  TempSrc: Oral  Resp: 20  SpO2: 98%    I personally performed the services described in this documentation, which was scribed in my presence. The recorded information has been reviewed and is accurate.        Renold Genta, PA-C 04/04/14 1543

## 2014-04-04 NOTE — ED Notes (Signed)
Pt reports having chronic lower back pain: pt reports having a history of degenerative disks, arthritis, and spinal stenosis. Pt also reports a history of fibromyalgia. Pt reports that the pain is now radiating down her legs. Pt is A/O x4.

## 2014-04-24 ENCOUNTER — Telehealth: Payer: Self-pay

## 2014-04-24 NOTE — Telephone Encounter (Signed)
Very sorry, but I no longer practice chronic pain management, and no longer prescribe the higher scheduled medications

## 2014-04-24 NOTE — Telephone Encounter (Signed)
The patient still has not gotten her medicaid and cannot go to pain management yet.  Tramadol is not working for her pain.  Is there anything else to offer? [phone]

## 2014-04-24 NOTE — Telephone Encounter (Signed)
Patient informed. 

## 2014-05-08 ENCOUNTER — Other Ambulatory Visit: Payer: Self-pay

## 2014-05-08 ENCOUNTER — Other Ambulatory Visit: Payer: Self-pay | Admitting: Internal Medicine

## 2014-05-08 MED ORDER — ALPRAZOLAM 0.5 MG PO TABS: ORAL_TABLET | ORAL | Status: DC

## 2014-05-08 NOTE — Telephone Encounter (Signed)
Faxed hardcopy to [employer] and patient informed

## 2014-05-08 NOTE — Telephone Encounter (Signed)
Done hardcopy to robin  

## 2014-05-10 ENCOUNTER — Telehealth: Payer: Self-pay

## 2014-05-10 NOTE — Telephone Encounter (Signed)
The patient had a BM today.  The color of the BM was orange and the water bright red.  The patient states she had eaten strawberry jello and was not sure if that would be the cause, but states has never happened to her.  Advise please.

## 2014-05-10 NOTE — Telephone Encounter (Signed)
Needs OV please.

## 2014-05-11 NOTE — Telephone Encounter (Signed)
Patient informed but at this time has no money or income.  Stated will schedule once financially able.

## 2014-05-19 ENCOUNTER — Emergency Department (HOSPITAL_COMMUNITY)
Admission: EM | Admit: 2014-05-19 | Discharge: 2014-05-19 | Disposition: A | Discharge status: Home / Self Care | Payer: Medicaid Other | Attending: Emergency Medicine | Admitting: Emergency Medicine

## 2014-05-19 ENCOUNTER — Encounter (HOSPITAL_COMMUNITY): Payer: Self-pay | Admitting: Emergency Medicine

## 2014-05-19 DIAGNOSIS — Z91040 Latex allergy status: Secondary | ICD-10-CM | POA: Insufficient documentation

## 2014-05-19 DIAGNOSIS — R209 Unspecified disturbances of skin sensation: Secondary | ICD-10-CM | POA: Insufficient documentation

## 2014-05-19 DIAGNOSIS — F329 Major depressive disorder, single episode, unspecified: Secondary | ICD-10-CM | POA: Diagnosis not present

## 2014-05-19 DIAGNOSIS — M25569 Pain in unspecified knee: Secondary | ICD-10-CM | POA: Diagnosis not present

## 2014-05-19 DIAGNOSIS — Z8709 Personal history of other diseases of the respiratory system: Secondary | ICD-10-CM | POA: Insufficient documentation

## 2014-05-19 DIAGNOSIS — F3289 Other specified depressive episodes: Secondary | ICD-10-CM | POA: Insufficient documentation

## 2014-05-19 DIAGNOSIS — M549 Dorsalgia, unspecified: Secondary | ICD-10-CM | POA: Diagnosis present

## 2014-05-19 DIAGNOSIS — I10 Essential (primary) hypertension: Secondary | ICD-10-CM | POA: Insufficient documentation

## 2014-05-19 DIAGNOSIS — G8929 Other chronic pain: Secondary | ICD-10-CM | POA: Diagnosis not present

## 2014-05-19 DIAGNOSIS — M25519 Pain in unspecified shoulder: Secondary | ICD-10-CM | POA: Insufficient documentation

## 2014-05-19 DIAGNOSIS — Z79899 Other long term (current) drug therapy: Secondary | ICD-10-CM | POA: Insufficient documentation

## 2014-05-19 DIAGNOSIS — M5430 Sciatica, unspecified side: Secondary | ICD-10-CM | POA: Insufficient documentation

## 2014-05-19 DIAGNOSIS — Z8719 Personal history of other diseases of the digestive system: Secondary | ICD-10-CM | POA: Insufficient documentation

## 2014-05-19 DIAGNOSIS — Z872 Personal history of diseases of the skin and subcutaneous tissue: Secondary | ICD-10-CM | POA: Diagnosis not present

## 2014-05-19 DIAGNOSIS — Z8614 Personal history of Methicillin resistant Staphylococcus aureus infection: Secondary | ICD-10-CM | POA: Diagnosis not present

## 2014-05-19 DIAGNOSIS — Z880 Allergy status to penicillin: Secondary | ICD-10-CM | POA: Diagnosis not present

## 2014-05-19 DIAGNOSIS — F411 Generalized anxiety disorder: Secondary | ICD-10-CM | POA: Diagnosis not present

## 2014-05-19 DIAGNOSIS — M25559 Pain in unspecified hip: Secondary | ICD-10-CM | POA: Insufficient documentation

## 2014-05-19 DIAGNOSIS — Z87891 Personal history of nicotine dependence: Secondary | ICD-10-CM | POA: Diagnosis not present

## 2014-05-19 DIAGNOSIS — M5431 Sciatica, right side: Secondary | ICD-10-CM

## 2014-05-19 MED ORDER — MELOXICAM 7.5 MG PO TABS: 15.0000 mg | ORAL_TABLET | Freq: Every day | ORAL | Status: DC

## 2014-05-19 MED ORDER — OXYCODONE-ACETAMINOPHEN 5-325 MG PO TABS
2.0000 | ORAL_TABLET | Freq: Once | ORAL | Status: AC
  Administered 2014-05-19: 2 via ORAL
  Filled 2014-05-19: qty 2

## 2014-05-19 MED ORDER — OXYCODONE-ACETAMINOPHEN 5-325 MG PO TABS: 1.0000 | ORAL_TABLET | Freq: Four times a day (QID) | ORAL | Status: DC | PRN

## 2014-05-19 NOTE — ED Notes (Signed)
Pt c/o lower back pain and R hip pain. States she fell in 2010 and injured her back and has had episodes of pain since. Pt also states she has cramping in her legs. Pt states she is afraid she will fall if she tries to walk. Pt arrives in wheelchair. Pt alert, no acute distress. Skin warm and dry.

## 2014-05-19 NOTE — ED Notes (Signed)
Pt has a ride home.  

## 2014-05-19 NOTE — ED Provider Notes (Signed)
Medical screening examination/treatment/procedure(s) were performed by non-physician practitioner and as supervising physician I was immediately available for consultation/collaboration.   EKG Interpretation None        Osvaldo Shipper, MD 05/19/14 2244

## 2014-05-19 NOTE — ED Provider Notes (Signed)
CSN: [id number]     Arrival date & time 05/19/14  1752 History  This chart was scribed for Andria Meuse, PA by Randa Evens, ED Scribe. This patient was seen in room WTR7/WTR7 and the patient's care was started at 6:02 PM.     Chief Complaint  Patient presents with  . Back Pain  . Hip Pain   The history is provided by the patient. No language interpreter was used.    HPI Comments: Maria [name] is a 58 y.o. female who presents to the Emergency Department complaining of back pain that started about 2 months ago. She states that she has a h/o of chronic back pain since 2010. She believes the pain is progressively  getting worse. She has associated numbness, right hip pain, knee pain, and arm pain. She states she doesn't take any medication for the pain right now. She states that previously before that Percocet relieves her pain. She denies any recents falls. She states that's she cant stand for longer than 3 minutes due to the pain. She states she gets anxious when standing because she has a fear of falling. She states that she currently is walking with a cane at baseline. She states that she has degenerative disc disease and has had numerous injections and muscle relaxers that didn't relieve her of her symptoms. She states she hasn't seen a back specialist because she doesn't have insurance and is currently waiting for Medicaid to be approved. Denies new injuries. Denies change in bowel or bladder. Denies fever, n/v/d. States she has been seen by [facility] in past, however does not want to have surgery.   Past Medical History  Diagnosis Date  . Acute lymphadenitis 08/13/2009  . Acute pharyngitis 02/10/2010  . ALLERGIC RHINITIS 08/10/2007  . ANXIETY 12/06/2007  . BACK PAIN 12/06/2007  . CHEST PAIN 08/13/2009  . DEPRESSION 12/06/2007  . DIVERTICULOSIS, [place] 12/06/2007  . GERD 12/06/2007  . HYPERTENSION 12/06/2007  . LOW BACK PAIN 12/06/2007  . MRSA 02/07/2009  . Pain in joint,  multiple sites 04/21/2010  . PARONYCHIA, FINGER 08/13/2009  . RASH-NONVESICULAR 05/09/2008  . SHOULDER PAIN, LEFT 05/09/2008  . SINUSITIS- ACUTE-NOS 05/09/2008  . THORACIC/LUMBOSACRAL NEURITIS/RADICULITIS UNSPEC 12/28/2008  . Lumbar disc disease 03/09/2012  . Cervical disc disease 03/09/2012  . Chronic pain 03/09/2012  . Impaired glucose tolerance 03/13/2012  . Cellulitis   . Atherosclerotic peripheral vascular disease 06/13/2013    Aorta on CT June 2014   Past Surgical History  Procedure Laterality Date  . Abdominal hysterectomy    . S/p ovary cyst    . S/p right knee arthroscopy      Dr. Mardelle Matte ortho  . Cervical disc surgery     Family History  Problem Relation Age of Onset  . Heart disease Mother   . Hypertension Mother   . Diabetes Mother   . Asthma Sister   . Asthma Daughter   . Cancer Maternal Uncle     [place]  . Cancer Other     ovarian   History  Substance Use Topics  . Smoking status: Former Smoker -- 30 years    Quit date: 11/08/2008  . Smokeless tobacco: Never Used     Comment: quit 10/09  . Alcohol Use: No   OB History   Grav Para Term Preterm Abortions TAB SAB Ect Mult Living                 Review of Systems  Musculoskeletal: Positive for arthralgias  and back pain.  Neurological: Positive for numbness.  Psychiatric/Behavioral: The patient is nervous/anxious.   All other systems reviewed and are negative.  Allergies  Amoxicillin-pot clavulanate; Codeine; Gluten meal; Lactose intolerance (gi); Morphine and related; Vicodin; and Latex  Home Medications   Prior to Admission medications   Medication Sig Start Date End Date Taking? Authorizing Provider  acetaminophen (TYLENOL) 325 MG tablet Take 2 tablets (650 mg total) by mouth every 6 (six) hours as needed. 08/19/13   Erline Hau, MD  albuterol (PROVENTIL HFA;VENTOLIN HFA) 108 (90 BASE) MCG/ACT inhaler Inhale 2 puffs into the lungs every 4 (four) hours as needed for wheezing (cough, shortness of  breath or wheezing.). 10/03/13   Biagio Borg, MD  ALPRAZolam Duanne Moron) 0.5 MG tablet take 1 tablet by mouth twice a day if needed for anxiety 05/08/14   Biagio Borg, MD  clonazePAM (KLONOPIN) 1 MG tablet TAKE 1/2 TO 1 TABLET TWICE DAILY AS NEEDED. 01/19/14   Biagio Borg, MD  cyclobenzaprine (FLEXERIL) 5 MG tablet Take 1 tablet (5 mg total) by mouth 3 (three) times daily as needed for muscle spasms. 08/15/13   Biagio Borg, MD  hydrochlorothiazide (HYDRODIURIL) 25 MG tablet TAKE ONE TABLET BY MOUTH EVERY DAY 07/10/13   Biagio Borg, MD  loperamide (IMODIUM) 2 MG capsule Take 2 mg by mouth 4 (four) times daily as needed for diarrhea or loose stools (for upset stomach).     Historical Provider, MD  meclizine (ANTIVERT) 12.5 MG tablet Take 1 tablet (12.5 mg total) by mouth 3 (three) times daily as needed. 08/15/13   Biagio Borg, MD  meloxicam (MOBIC) 7.5 MG tablet Take 2 tablets (15 mg total) by mouth daily. Take daily for 7 days, then as needed for pain. 05/19/14   Noland Fordyce, PA-C  ondansetron (ZOFRAN) 4 MG tablet Take 1 tablet (4 mg total) by mouth every 6 (six) hours as needed for nausea. 08/19/13   Erline Hau, MD  oxyCODONE-acetaminophen (PERCOCET) 5-325 MG per tablet Take 1 tablet by mouth every 4 (four) hours as needed for severe pain. 04/04/14   Tatyana A Kirichenko, PA-C  oxyCODONE-acetaminophen (PERCOCET/ROXICET) 5-325 MG per tablet Take by mouth every 4 (four) hours as needed for severe pain.    Historical Provider, MD  oxyCODONE-acetaminophen (PERCOCET/ROXICET) 5-325 MG per tablet Take 1-2 tablets by mouth every 6 (six) hours as needed for moderate pain or severe pain. 05/19/14   Noland Fordyce, PA-C  traMADol (ULTRAM) 50 MG tablet Take 1 tablet (50 mg total) by mouth every 8 (eight) hours as needed. 02/09/14   Biagio Borg, MD   BP 146/85  Pulse 102  Temp(Src) 98.2 F (36.8 C) (Oral)  Resp 20  SpO2 100% Physical Exam  Nursing note and vitals reviewed. Constitutional: She is  oriented to person, place, and time. She appears well-developed and well-nourished.  Morbidly obese female Sitting in exam chair appear uncomfortable cane by her side.   HENT:  Head: Normocephalic and atraumatic.  Eyes: EOM are normal.  Neck: Normal range of motion.  Cardiovascular: Normal rate.   Pulmonary/Chest: Effort normal.  Musculoskeletal: Normal range of motion. She exhibits tenderness.  Tenderness along lumbar paraspinal muscles right side and right hip  Neurological: She is alert and oriented to person, place, and time.  Skin: Skin is warm and dry.  Psychiatric: She has a normal mood and affect. Her behavior is normal.    ED Course  Procedures (including critical care  time) DIAGNOSTIC STUDIES: Oxygen Saturation is 100% on RA, normal by my interpretation.    COORDINATION OF CARE: 6:16 PM-Discussed treatment plan which includes pain medication with pt at bedside and pt agreed to plan.   Labs Review Labs Reviewed - No data to display  Imaging Review No results found.   EKG Interpretation None      MDM   Final diagnoses:  Right sided sciatica   Pt presenting with exacerbation of chronic low back and right hip pain. No red flag symptoms. No new injuries.  Do not believe imaging needed at this time. Not concerned for emergent process taking place. Will tx symptomatically as needed for pain.  Rx: percocet and mobic. Advised to f/u with [facility], pain management and PCP. Return precautions provided. Pt verbalized understanding and agreement with tx plan.    I personally performed the services described in this documentation, which was scribed in my presence. The recorded information has been reviewed and is accurate.      Noland Fordyce, PA-C 05/19/14 [phone]

## 2014-06-07 ENCOUNTER — Encounter (HOSPITAL_COMMUNITY): Payer: Self-pay | Admitting: Emergency Medicine

## 2014-06-07 ENCOUNTER — Telehealth: Payer: Self-pay

## 2014-06-07 ENCOUNTER — Emergency Department (HOSPITAL_COMMUNITY)
Admission: EM | Admit: 2014-06-07 | Discharge: 2014-06-07 | Disposition: A | Discharge status: Home / Self Care | Payer: Medicaid Other | Attending: Emergency Medicine | Admitting: Emergency Medicine

## 2014-06-07 DIAGNOSIS — G8929 Other chronic pain: Secondary | ICD-10-CM | POA: Insufficient documentation

## 2014-06-07 DIAGNOSIS — F411 Generalized anxiety disorder: Secondary | ICD-10-CM | POA: Insufficient documentation

## 2014-06-07 DIAGNOSIS — Z880 Allergy status to penicillin: Secondary | ICD-10-CM | POA: Insufficient documentation

## 2014-06-07 DIAGNOSIS — Z8709 Personal history of other diseases of the respiratory system: Secondary | ICD-10-CM | POA: Insufficient documentation

## 2014-06-07 DIAGNOSIS — F329 Major depressive disorder, single episode, unspecified: Secondary | ICD-10-CM | POA: Insufficient documentation

## 2014-06-07 DIAGNOSIS — I10 Essential (primary) hypertension: Secondary | ICD-10-CM | POA: Insufficient documentation

## 2014-06-07 DIAGNOSIS — R42 Dizziness and giddiness: Secondary | ICD-10-CM | POA: Diagnosis present

## 2014-06-07 DIAGNOSIS — Z87891 Personal history of nicotine dependence: Secondary | ICD-10-CM | POA: Diagnosis not present

## 2014-06-07 DIAGNOSIS — Z91040 Latex allergy status: Secondary | ICD-10-CM | POA: Diagnosis not present

## 2014-06-07 DIAGNOSIS — M545 Low back pain: Secondary | ICD-10-CM

## 2014-06-07 DIAGNOSIS — Z79899 Other long term (current) drug therapy: Secondary | ICD-10-CM | POA: Insufficient documentation

## 2014-06-07 DIAGNOSIS — M5430 Sciatica, unspecified side: Secondary | ICD-10-CM | POA: Diagnosis not present

## 2014-06-07 DIAGNOSIS — Z872 Personal history of diseases of the skin and subcutaneous tissue: Secondary | ICD-10-CM | POA: Insufficient documentation

## 2014-06-07 DIAGNOSIS — Z8719 Personal history of other diseases of the digestive system: Secondary | ICD-10-CM | POA: Insufficient documentation

## 2014-06-07 DIAGNOSIS — Z8614 Personal history of Methicillin resistant Staphylococcus aureus infection: Secondary | ICD-10-CM | POA: Insufficient documentation

## 2014-06-07 DIAGNOSIS — F3289 Other specified depressive episodes: Secondary | ICD-10-CM | POA: Diagnosis not present

## 2014-06-07 LAB — URINALYSIS, ROUTINE W REFLEX MICROSCOPIC
BILIRUBIN URINE: NEGATIVE
Glucose, UA: NEGATIVE mg/dL
Hgb urine dipstick: NEGATIVE
KETONES UR: NEGATIVE mg/dL
LEUKOCYTES UA: NEGATIVE
NITRITE: NEGATIVE
PROTEIN: NEGATIVE mg/dL
Specific Gravity, Urine: 1.003 — ABNORMAL LOW (ref 1.005–1.030)
UROBILINOGEN UA: 0.2 mg/dL (ref 0.0–1.0)
pH: 6 (ref 5.0–8.0)

## 2014-06-07 LAB — CBC
HEMATOCRIT: 44.8 % (ref 36.0–46.0)
HEMOGLOBIN: 15.2 g/dL — AB (ref 12.0–15.0)
MCH: 31.6 pg (ref 26.0–34.0)
MCHC: 33.9 g/dL (ref 30.0–36.0)
MCV: 93.1 fL (ref 78.0–100.0)
Platelets: 300 10*3/uL (ref 150–400)
RBC: 4.81 MIL/uL (ref 3.87–5.11)
RDW: 12.6 % (ref 11.5–15.5)
WBC: 11.3 10*3/uL — ABNORMAL HIGH (ref 4.0–10.5)

## 2014-06-07 LAB — COMPREHENSIVE METABOLIC PANEL
ALK PHOS: 77 U/L (ref 39–117)
ALT: 28 U/L (ref 0–35)
AST: 24 U/L (ref 0–37)
Albumin: 3.8 g/dL (ref 3.5–5.2)
BILIRUBIN TOTAL: 0.5 mg/dL (ref 0.3–1.2)
BUN: 11 mg/dL (ref 6–23)
CHLORIDE: 101 meq/L (ref 96–112)
CO2: 27 meq/L (ref 19–32)
Calcium: 9.3 mg/dL (ref 8.4–10.5)
Creatinine, Ser: 0.82 mg/dL (ref 0.50–1.10)
GFR, EST NON AFRICAN AMERICAN: 78 mL/min — AB (ref >90)
GLUCOSE: 93 mg/dL (ref 70–99)
POTASSIUM: 4.1 meq/L (ref 3.7–5.3)
Sodium: 141 mEq/L (ref 137–147)
Total Protein: 7.2 g/dL (ref 6.0–8.3)

## 2014-06-07 LAB — I-STAT TROPONIN, ED: Troponin i, poc: 0.01 ng/mL (ref 0.00–0.08)

## 2014-06-07 LAB — CBG MONITORING, ED: GLUCOSE-CAPILLARY: 98 mg/dL (ref 70–99)

## 2014-06-07 LAB — MAGNESIUM: Magnesium: 1.8 mg/dL (ref 1.5–2.5)

## 2014-06-07 MED ORDER — OXYCODONE-ACETAMINOPHEN 5-325 MG PO TABS
2.0000 | ORAL_TABLET | Freq: Once | ORAL | Status: AC
  Administered 2014-06-07: 2 via ORAL
  Filled 2014-06-07: qty 2

## 2014-06-07 MED ORDER — OXYCODONE-ACETAMINOPHEN 5-325 MG PO TABS: 1.0000 | ORAL_TABLET | Freq: Four times a day (QID) | ORAL | Status: DC | PRN

## 2014-06-07 MED ORDER — ONDANSETRON 4 MG PO TBDP
4.0000 mg | ORAL_TABLET | Freq: Once | ORAL | Status: AC
  Administered 2014-06-07: 4 mg via ORAL
  Filled 2014-06-07: qty 1

## 2014-06-07 MED ORDER — POTASSIUM CHLORIDE ER 10 MEQ PO TBCR: 10.0000 meq | EXTENDED_RELEASE_TABLET | Freq: Every day | ORAL | Status: DC

## 2014-06-07 NOTE — Telephone Encounter (Signed)
Possible, but really hard to tell since the symptoms are so general.  Consider OV for further evaluation if recurs

## 2014-06-07 NOTE — ED Notes (Signed)
Ambulated patient in the hall.  Patient did well. O2 Stats 93 heart rate 93

## 2014-06-07 NOTE — Telephone Encounter (Signed)
Patient informed script sent in and to schedule appt. No later than Aug. 2015.  The patient just had some dizziness and arm tightness, her question is could this have something to do with her potassium being low?

## 2014-06-07 NOTE — ED Provider Notes (Signed)
CSN: [id number]     Arrival date & time 06/07/14  1400 History   None    Chief Complaint  Patient presents with  . Dizziness  . Muscle Pain     (Consider location/radiation/quality/duration/timing/severity/associated sxs/prior Treatment) HPI Maria [name] is a(n) 58 y.o. female who presents with chief complaint of muscle spasms and dizziness. Patient has a complicated past medical history which includes long-term tobacco abuse, chronic sciatica. Patient states that she's had worsening of her chronic muscle cramps in the past week. She has been waking in the morning with severe "charley horses" in her calves. Patient states she takes Flexeril which has not been working. She is also on hydrochlorothiazide for her hypertension she states she has not been taking any of her potassium lately. She complains that she has been urinating frequently and excessively thirsty . Patient states that this morning she had sudden onset severe dizziness while sitting. She felt a cramp in her left chest, racing in her heart and had associated pulsating scotoma. Patient states that it lasted approximately one hour and resolved since she's been here in the emergency department. She states she does have a history of panic attacks. She denies any headache, unilateral weakness, difficulty with speech or swallowing. Her symptoms have resolved here in the emergency department. She denies any excessive heat exposure.   Past Medical History  Diagnosis Date  . Acute lymphadenitis 08/13/2009  . Acute pharyngitis 02/10/2010  . ALLERGIC RHINITIS 08/10/2007  . ANXIETY 12/06/2007  . BACK PAIN 12/06/2007  . CHEST PAIN 08/13/2009  . DEPRESSION 12/06/2007  . DIVERTICULOSIS, [place] 12/06/2007  . GERD 12/06/2007  . HYPERTENSION 12/06/2007  . LOW BACK PAIN 12/06/2007  . MRSA 02/07/2009  . Pain in joint, multiple sites 04/21/2010  . PARONYCHIA, FINGER 08/13/2009  . RASH-NONVESICULAR 05/09/2008  . SHOULDER PAIN, LEFT 05/09/2008  .  SINUSITIS- ACUTE-NOS 05/09/2008  . THORACIC/LUMBOSACRAL NEURITIS/RADICULITIS UNSPEC 12/28/2008  . Lumbar disc disease 03/09/2012  . Cervical disc disease 03/09/2012  . Chronic pain 03/09/2012  . Impaired glucose tolerance 03/13/2012  . Cellulitis   . Atherosclerotic peripheral vascular disease 06/13/2013    Aorta on CT June 2014   Past Surgical History  Procedure Laterality Date  . Abdominal hysterectomy    . S/p ovary cyst    . S/p right knee arthroscopy      Dr. Mardelle Matte ortho  . Cervical disc surgery     Family History  Problem Relation Age of Onset  . Heart disease Mother   . Hypertension Mother   . Diabetes Mother   . Asthma Sister   . Asthma Daughter   . Cancer Maternal Uncle     [place]  . Cancer Other     ovarian   History  Substance Use Topics  . Smoking status: Former Smoker -- 30 years    Quit date: 11/08/2008  . Smokeless tobacco: Never Used     Comment: quit 10/09  . Alcohol Use: No   OB History   Grav Para Term Preterm Abortions TAB SAB Ect Mult Living                 Review of Systems  Ten systems reviewed and are negative for acute change, except as noted in the HPI.    Allergies  Amoxicillin-pot clavulanate; Codeine; Gluten meal; Lactose intolerance (gi); Morphine and related; Vicodin; and Latex  Home Medications   Prior to Admission medications   Medication Sig Start Date End Date Taking? Authorizing Provider  albuterol (  PROVENTIL HFA;VENTOLIN HFA) 108 (90 BASE) MCG/ACT inhaler Inhale 2 puffs into the lungs every 4 (four) hours as needed for wheezing (cough, shortness of breath or wheezing.). 10/03/13  Yes Biagio Borg, MD  ALPRAZolam Duanne Moron) 0.5 MG tablet take 1 tablet by mouth twice a day if needed for anxiety 05/08/14  Yes Biagio Borg, MD  clonazePAM (KLONOPIN) 1 MG tablet Take 0.5 mg by mouth 2 (two) times daily as needed for anxiety.   Yes Historical Provider, MD  cyclobenzaprine (FLEXERIL) 5 MG tablet Take 1 tablet (5 mg total) by mouth 3 (three)  times daily as needed for muscle spasms. 08/15/13  Yes Biagio Borg, MD  hydrochlorothiazide (HYDRODIURIL) 25 MG tablet Take 25 mg by mouth daily.   Yes Historical Provider, MD  meloxicam (MOBIC) 7.5 MG tablet Take 2 tablets (15 mg total) by mouth daily. Take daily for 7 days, then as needed for pain. 05/19/14  Yes Noland Fordyce, PA-C  OVER THE COUNTER MEDICATION Place 2 each into both eyes daily. For dry eyes   Yes Historical Provider, MD  oxyCODONE-acetaminophen (PERCOCET) 5-325 MG per tablet Take 1 tablet by mouth every 4 (four) hours as needed for severe pain. 04/04/14  Yes Tatyana A Kirichenko, PA-C  potassium chloride (KLOR-CON 10) 10 MEQ tablet Take 1 tablet (10 mEq total) by mouth daily. 06/07/14  Yes Biagio Borg, MD  traMADol (ULTRAM) 50 MG tablet Take 1 tablet (50 mg total) by mouth every 8 (eight) hours as needed. 02/09/14  Yes Biagio Borg, MD   BP 150/69  Pulse 87  Temp(Src) 98.4 F (36.9 C) (Oral)  Resp 16  SpO2 96% Physical Exam Physical Exam  Nursing note and vitals reviewed. Constitutional: He appears well-developed and well-nourished. No distress.  HENT:  Head: Normocephalic and atraumatic.  Eyes: Conjunctivae normal are normal. No scleral icterus.  Neck: Normal range of motion. Neck supple.  Cardiovascular: Normal rate, regular rhythm and normal heart sounds.   Pulmonary/Chest: Effort normal and breath sounds normal. No respiratory distress.  Abdominal: Soft. There is no tenderness.  Musculoskeletal: He exhibits no edema.  Neurological: He is alert.  Speech is clear and goal oriented, follows commands Major Cranial nerves without deficit, no facial droop Normal strength in upper and lower extremities bilaterally including dorsiflexion and plantar flexion, strong and equal grip strength Sensation normal to light and sharp touch Moves extremities without ataxia, coordination intact Normal finger to nose and rapid alternating movements Neg romberg, no pronator  drift Normal gait Normal heel-shin and balance Skin: Skin is warm and dry. He is not diaphoretic.  Psychiatric: His behavior is normal.    ED Course  Procedures (including critical care time) Labs Review Labs Reviewed  CBC - Abnormal; Notable for the following:    WBC 11.3 (*)    Hemoglobin 15.2 (*)    All other components within normal limits  COMPREHENSIVE METABOLIC PANEL - Abnormal; Notable for the following:    GFR calc non Af Amer 78 (*)    All other components within normal limits  MAGNESIUM  POCT CBG (FASTING - GLUCOSE)-MANUAL ENTRY  I-STAT TROPOININ, ED  CBG MONITORING, ED    Imaging Review No results found.   EKG Interpretation None      Date: 06/07/2014  Rate: 77  Rhythm: normal sinus rhythm  QRS Axis: normal  Intervals: normal  ST/T Wave abnormalities: minimal st depression diffusely  Conduction Disutrbances:none  Narrative Interpretation:   Old EKG Reviewed: unchanged    MDM  Final diagnoses:  Dizziness  Right low back pain, with sciatica presence unspecified    Patient with c/o of vague neurologic sxs and cramps.  ddx includes arrhythmia, presyncope, TIA, anxiety attack, diabetes. Here CMP is essentially normal. She has an elevated hgb and leukocytosis.  I believe the patient is still abusing tobacco as she smells strongly of tobacco, but she denies this. She may also have dehydration. neruo exam is unremarkable. EKG shows Minimal ST depression which is not new.   7:08 PM BP 150/69  Pulse 87  Temp(Src) 98.4 F (36.9 C) (Oral)  Resp 16  SpO2 96% Patient with no return of sxs.  Ambulated in ED without difficulty. Normal 02 sats. Discussed need for pcp follow up and possible holter monitoring. Pain meds at dc Pt stable in ED with no significant deterioration in condition. Patient / Family / Caregiver informed of clinical course, understand medical decision-making process, and agree with plan.   Margarita Mail, PA-C 06/07/14 [id number]

## 2014-06-07 NOTE — Telephone Encounter (Signed)
Patient informed of MD instructions.  Stated at this time she was at the ER at [facility], her daughter brought her in.

## 2014-06-07 NOTE — ED Notes (Signed)
P c/o dizziness for a while and states when she wakes up in the morning her calves are sore like someone is stabbing. Pt also c/o spasms in here arms

## 2014-06-07 NOTE — Telephone Encounter (Signed)
The patient called to inform she takes HCTZ and has been waking with leg cramps.  She has been out of her potassium, ok to fill??

## 2014-06-07 NOTE — ED Notes (Signed)
PA-C at bedside 

## 2014-06-07 NOTE — Discharge Instructions (Signed)
SEEK IMMEDIATE MEDICAL ATTENTION IF: New numbness, tingling, weakness, or problem with the use of your arms or legs.  Severe back pain not relieved with medications.  Change in bowel or bladder control.  Increasing pain in any areas of the body (such as chest or abdominal pain).  Shortness of breath, dizziness or fainting.  Nausea (feeling sick to your stomach), vomiting, fever, or sweats.   SEEK IMMEDIATE MEDICAL ATTENTION (Call 911) IF: The original symptoms that brought you in are getting worse, or if you develop any new change in speech, vision, swallowing, or understanding, incoordination, weakness, numbness, tingling, dizziness, fainting, severe headache, chest pain, or other concerns. Some patients who are having a stroke are eligible to receive a medication which may improve their outcome, but the drug usually must be given within three hours from when symptoms first occur. So if you think you might be having stroke symptoms, don't wait, call 911.  Chronic Pain Discharge Instructions  Emergency care providers appreciate that many patients coming to [place] are in severe pain and we wish to address their pain in the safest, most responsible manner.  It is important to recognize however, that the proper treatment of chronic pain differs from that of the pain of injuries and acute illnesses.  Our goal is to provide quality, safe, personalized care and we thank you for giving [place] the opportunity to serve you. The use of narcotics and related agents for chronic pain syndromes may lead to additional physical and psychological problems.  Nearly as many people die from prescription narcotics each year as die from car crashes.  Additionally, this risk is increased if such prescriptions are obtained from a variety of sources.  Therefore, only your primary care physician or a pain management specialist is able to safely treat such syndromes with narcotic medications long-term.    Documentation revealing such  prescriptions have been sought from multiple sources may prohibit [place] from providing a refill or different narcotic medication.  Your name may be checked first through the [place].  This database is a record of controlled substance medication prescriptions that the patient has received.  This has been established by [facility] in an effort to eliminate the dangerous, and often life threatening, practice of obtaining multiple prescriptions from different medical providers.   If you have a chronic pain syndrome (i.e. chronic headaches, recurrent back or neck pain, dental pain, abdominal or pelvis pain without a specific diagnosis, or neuropathic pain such as fibromyalgia) or recurrent visits for the same condition without an acute diagnosis, you may be treated with non-narcotics and other non-addictive medicines.  Allergic reactions or negative side effects that may be reported by a patient to such medications will not typically lead to the use of a narcotic analgesic or other controlled substance as an alternative.   Patients managing chronic pain with a personal physician should have provisions in place for breakthrough pain.  If you are in crisis, you should call your physician.  If your physician directs you to the emergency department, please have the doctor call and speak to our attending physician concerning your care.   When patients come to the Emergency Department (ED) with acute medical conditions in which the Emergency Department physician feels appropriate to prescribe narcotic or sedating pain medication, the physician will prescribe these in very limited quantities.  The amount of these medications will last only until you can see your primary care physician in his/her office.  Any patient  who returns to the ED seeking refills should expect only non-narcotic pain medications.   In the event of an acute medical condition exists and the emergency  physician feels it is necessary that the patient be given a narcotic or sedating medication -  a responsible adult driver should be present in the room prior to the medication being given by the nurse.   Prescriptions for narcotic or sedating medications that have been lost, stolen or expired will not be refilled in the Emergency Department.    Patients who have chronic pain may receive non-narcotic prescriptions until seen by their primary care physician.  It is every patients personal responsibility to maintain active prescriptions with his or her primary care physician or specialist.

## 2014-06-07 NOTE — ED Provider Notes (Signed)
Medical screening examination/treatment/procedure(s) were performed by non-physician practitioner and as supervising physician I was immediately available for consultation/collaboration.   EKG Interpretation None       Varney Biles, MD 06/07/14 [id number]

## 2014-06-07 NOTE — Telephone Encounter (Signed)
Ok for potassium re-start, but please remind to f/u no later than aug 2015 please

## 2014-07-05 ENCOUNTER — Emergency Department (HOSPITAL_COMMUNITY): Payer: Medicaid Other

## 2014-07-05 ENCOUNTER — Encounter (HOSPITAL_COMMUNITY): Payer: Self-pay | Admitting: Emergency Medicine

## 2014-07-05 ENCOUNTER — Emergency Department (HOSPITAL_COMMUNITY)
Admission: EM | Admit: 2014-07-05 | Discharge: 2014-07-05 | Disposition: A | Discharge status: Home / Self Care | Payer: Medicaid Other | Attending: Emergency Medicine | Admitting: Emergency Medicine

## 2014-07-05 DIAGNOSIS — S8990XA Unspecified injury of unspecified lower leg, initial encounter: Secondary | ICD-10-CM | POA: Diagnosis not present

## 2014-07-05 DIAGNOSIS — Y9210 Unspecified residential institution as the place of occurrence of the external cause: Secondary | ICD-10-CM | POA: Insufficient documentation

## 2014-07-05 DIAGNOSIS — X500XXA Overexertion from strenuous movement or load, initial encounter: Secondary | ICD-10-CM | POA: Diagnosis not present

## 2014-07-05 DIAGNOSIS — I10 Essential (primary) hypertension: Secondary | ICD-10-CM | POA: Insufficient documentation

## 2014-07-05 DIAGNOSIS — Z880 Allergy status to penicillin: Secondary | ICD-10-CM | POA: Insufficient documentation

## 2014-07-05 DIAGNOSIS — Y9389 Activity, other specified: Secondary | ICD-10-CM | POA: Insufficient documentation

## 2014-07-05 DIAGNOSIS — S0993XA Unspecified injury of face, initial encounter: Secondary | ICD-10-CM | POA: Diagnosis not present

## 2014-07-05 DIAGNOSIS — S59919A Unspecified injury of unspecified forearm, initial encounter: Secondary | ICD-10-CM

## 2014-07-05 DIAGNOSIS — G8929 Other chronic pain: Secondary | ICD-10-CM | POA: Diagnosis not present

## 2014-07-05 DIAGNOSIS — M62838 Other muscle spasm: Secondary | ICD-10-CM | POA: Diagnosis not present

## 2014-07-05 DIAGNOSIS — Z872 Personal history of diseases of the skin and subcutaneous tissue: Secondary | ICD-10-CM | POA: Insufficient documentation

## 2014-07-05 DIAGNOSIS — IMO0002 Reserved for concepts with insufficient information to code with codable children: Secondary | ICD-10-CM | POA: Insufficient documentation

## 2014-07-05 DIAGNOSIS — Z791 Long term (current) use of non-steroidal anti-inflammatories (NSAID): Secondary | ICD-10-CM | POA: Diagnosis not present

## 2014-07-05 DIAGNOSIS — S99929A Unspecified injury of unspecified foot, initial encounter: Secondary | ICD-10-CM | POA: Diagnosis not present

## 2014-07-05 DIAGNOSIS — F329 Major depressive disorder, single episode, unspecified: Secondary | ICD-10-CM | POA: Diagnosis not present

## 2014-07-05 DIAGNOSIS — Z91040 Latex allergy status: Secondary | ICD-10-CM | POA: Diagnosis not present

## 2014-07-05 DIAGNOSIS — Z87891 Personal history of nicotine dependence: Secondary | ICD-10-CM | POA: Insufficient documentation

## 2014-07-05 DIAGNOSIS — S59909A Unspecified injury of unspecified elbow, initial encounter: Secondary | ICD-10-CM | POA: Insufficient documentation

## 2014-07-05 DIAGNOSIS — S46909A Unspecified injury of unspecified muscle, fascia and tendon at shoulder and upper arm level, unspecified arm, initial encounter: Secondary | ICD-10-CM | POA: Diagnosis present

## 2014-07-05 DIAGNOSIS — Z79899 Other long term (current) drug therapy: Secondary | ICD-10-CM | POA: Insufficient documentation

## 2014-07-05 DIAGNOSIS — S6990XA Unspecified injury of unspecified wrist, hand and finger(s), initial encounter: Secondary | ICD-10-CM

## 2014-07-05 DIAGNOSIS — F3289 Other specified depressive episodes: Secondary | ICD-10-CM | POA: Insufficient documentation

## 2014-07-05 DIAGNOSIS — R296 Repeated falls: Secondary | ICD-10-CM | POA: Insufficient documentation

## 2014-07-05 DIAGNOSIS — Z8719 Personal history of other diseases of the digestive system: Secondary | ICD-10-CM | POA: Insufficient documentation

## 2014-07-05 DIAGNOSIS — M25562 Pain in left knee: Secondary | ICD-10-CM

## 2014-07-05 DIAGNOSIS — S199XXA Unspecified injury of neck, initial encounter: Secondary | ICD-10-CM

## 2014-07-05 DIAGNOSIS — S4980XA Other specified injuries of shoulder and upper arm, unspecified arm, initial encounter: Secondary | ICD-10-CM | POA: Diagnosis present

## 2014-07-05 DIAGNOSIS — Z8614 Personal history of Methicillin resistant Staphylococcus aureus infection: Secondary | ICD-10-CM | POA: Diagnosis not present

## 2014-07-05 DIAGNOSIS — Z8709 Personal history of other diseases of the respiratory system: Secondary | ICD-10-CM | POA: Diagnosis not present

## 2014-07-05 DIAGNOSIS — S99919A Unspecified injury of unspecified ankle, initial encounter: Secondary | ICD-10-CM

## 2014-07-05 DIAGNOSIS — F411 Generalized anxiety disorder: Secondary | ICD-10-CM | POA: Insufficient documentation

## 2014-07-05 MED ORDER — OXYCODONE-ACETAMINOPHEN 5-325 MG PO TABS
1.0000 | ORAL_TABLET | Freq: Once | ORAL | Status: AC
  Administered 2014-07-05: 1 via ORAL
  Filled 2014-07-05: qty 1

## 2014-07-05 MED ORDER — OXYCODONE-ACETAMINOPHEN 5-325 MG PO TABS: 1.0000 | ORAL_TABLET | Freq: Four times a day (QID) | ORAL | Status: DC | PRN

## 2014-07-05 MED ORDER — CYCLOBENZAPRINE HCL 10 MG PO TABS: 10.0000 mg | ORAL_TABLET | Freq: Two times a day (BID) | ORAL | Status: DC | PRN

## 2014-07-05 NOTE — ED Notes (Signed)
Per EMS: Pt was eating at a buffet with friends when she lost her balance in a chair and fell.  Fell onto lt side.  C/o lt shoulder pain, rt hand pain.  No deformities noted.  Has chronic low back and leg pain also.

## 2014-07-05 NOTE — ED Provider Notes (Signed)
CSN: [id number]     Arrival date & time 07/05/14  1458 History  This chart was scribed for Starlyn Skeans, PA-C, non-physician practitioner working with Leota Jacobsen, MD by Vernell Barrier, ED scribe. This patient was seen in room WTR5/WTR5 and the patient's care was started at 3:52 PM.  Chief Complaint  Patient presents with  . Fall  . Shoulder Pain   Patient is a 58 y.o. female presenting with shoulder pain and knee pain. The history is provided by the patient. No language interpreter was used.  Shoulder Pain This is a new problem. The current episode started 1 to 2 hours ago. The problem occurs constantly. The problem has been gradually worsening. Nothing aggravates the symptoms. Nothing relieves the symptoms. She has tried nothing for the symptoms.  Knee Pain Location:  Knee Injury: yes   Mechanism of injury: fall   Fall:    Fall occurred: form chair.   Impact surface:  Hard floor   Point of impact:  Knees and hands   Entrapped after fall: no   Knee location:  L knee Pain details:    Quality:  Sharp   Radiates to:  Does not radiate   Severity:  Moderate   Onset quality:  Gradual   Timing:  Constant   Progression:  Worsening Chronicity:  New Dislocation: no   Foreign body present:  No foreign bodies Prior injury to area:  No Relieved by:  Nothing Worsened by:  Nothing tried Ineffective treatments:  None tried Associated symptoms: back pain and neck pain   Risk factors: known bone disorder    HPI Comments: Maria [name] is a 58 y.o. female w/ hx of HTN, fibromyalgia, spinal stenosis, and chronic back and leg pain presents to the Emergency Department for fall occurring earlier this afternoon. No head injury or LOC. Pt was attempting to sit in a chair at a restaurant when she lost her balance and fell on her left side. Fell on her left hip but twisted her left knee. Complains of left wrist, achy left shoulder, and sharp left knee pain. Left shoulder pain rating 8/10.  Pain primarily along the scapula and radiating to left side of neck. No tingling or numbness in the left arm. Some tightness the left fingers. Hx of double fusion in neck at 5/6. 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 Sharp non radiating left knee joint pain; worse with pressure. Reports some mild numbness in left foot. States left knee felt like it was going to pop once she stood up. No prior left knee pain. Baseline lower extremity edema. Pt ambulates with assistance of a cane. Hx of right knee surgery in 2007 performed by Dr. Karin Lieu. States Percocet and IV Dilaudid is the only thing that relieves her pain.   Past Medical History  Diagnosis Date  . Acute lymphadenitis 08/13/2009  . Acute pharyngitis 02/10/2010  . ALLERGIC RHINITIS 08/10/2007  . ANXIETY 12/06/2007  . BACK PAIN 12/06/2007  . CHEST PAIN 08/13/2009  . DEPRESSION 12/06/2007  . DIVERTICULOSIS, [place] 12/06/2007  . GERD 12/06/2007  . HYPERTENSION 12/06/2007  . LOW BACK PAIN 12/06/2007  . MRSA 02/07/2009  . Pain in joint, multiple sites 04/21/2010  . PARONYCHIA, FINGER 08/13/2009  . RASH-NONVESICULAR 05/09/2008  . SHOULDER PAIN, LEFT 05/09/2008  . SINUSITIS- ACUTE-NOS 05/09/2008  . THORACIC/LUMBOSACRAL NEURITIS/RADICULITIS UNSPEC 12/28/2008  . Lumbar disc disease 03/09/2012  . Cervical disc disease 03/09/2012  . Chronic pain 03/09/2012  . Impaired glucose tolerance 03/13/2012  . Cellulitis   .  Atherosclerotic peripheral vascular disease 06/13/2013    Aorta on CT June 2014   Past Surgical History  Procedure Laterality Date  . Abdominal hysterectomy    . S/p ovary cyst    . S/p right knee arthroscopy      Dr. Mardelle Matte ortho  . Cervical disc surgery     Family History  Problem Relation Age of Onset  . Heart disease Mother   . Hypertension Mother   . Diabetes Mother   . Asthma Sister   . Asthma Daughter   . Cancer Maternal Uncle     [place]  . Cancer Other     ovarian   History  Substance Use Topics  . Smoking status: Former Smoker  -- 30 years    Quit date: 11/08/2008  . Smokeless tobacco: Never Used     Comment: quit 10/09  . Alcohol Use: No   OB History   Grav Para Term Preterm Abortions TAB SAB Ect Mult Living                 Review of Systems  Cardiovascular: Positive for leg swelling.  Musculoskeletal: Positive for arthralgias, back pain, joint swelling, myalgias and neck pain.  All other systems reviewed and are negative.  Allergies  Amoxicillin-pot clavulanate; Codeine; Gluten meal; Lactose intolerance (gi); Morphine and related; Vicodin; and Latex  Home Medications   Prior to Admission medications   Medication Sig Start Date End Date Taking? Authorizing Provider  albuterol (PROVENTIL HFA;VENTOLIN HFA) 108 (90 BASE) MCG/ACT inhaler Inhale 2 puffs into the lungs every 4 (four) hours as needed for wheezing (cough, shortness of breath or wheezing.). 10/03/13  Yes Biagio Borg, MD  ALPRAZolam Duanne Moron) 0.5 MG tablet take 1 tablet by mouth twice a day if needed for anxiety 05/08/14  Yes Biagio Borg, MD  clonazePAM (KLONOPIN) 1 MG tablet Take 0.5 mg by mouth 2 (two) times daily as needed for anxiety.   Yes Historical Provider, MD  cyclobenzaprine (FLEXERIL) 5 MG tablet Take 1 tablet (5 mg total) by mouth 3 (three) times daily as needed for muscle spasms. 08/15/13  Yes Biagio Borg, MD  hydrochlorothiazide (HYDRODIURIL) 25 MG tablet Take 25 mg by mouth daily.   Yes Historical Provider, MD  ibuprofen (ADVIL,MOTRIN) 200 MG tablet Take 800 mg by mouth every 6 (six) hours as needed for moderate pain.   Yes Historical Provider, MD  meloxicam (MOBIC) 7.5 MG tablet Take 2 tablets (15 mg total) by mouth daily. Take daily for 7 days, then as needed for pain. 05/19/14  Yes Noland Fordyce, PA-C  OVER THE COUNTER MEDICATION Place 2 each into both eyes daily. For dry eyes   Yes Historical Provider, MD  cyclobenzaprine (FLEXERIL) 10 MG tablet Take 1 tablet (10 mg total) by mouth 2 (two) times daily as needed for muscle spasms.  07/05/14   Castiel Lauricella A Forcucci, PA-C  oxyCODONE-acetaminophen (PERCOCET/ROXICET) 5-325 MG per tablet Take 1 tablet by mouth every 6 (six) hours as needed for moderate pain or severe pain. 07/05/14   Cheryel Kyte A Forcucci, PA-C   Triage vitals: BP 160/75  Pulse 96  Temp(Src) 99.1 F (37.3 C) (Oral)  Resp 20  SpO2 98%  Physical Exam  Nursing note and vitals reviewed. Constitutional: She is oriented to person, place, and time. She appears well-developed and well-nourished. No distress.  HENT:  Head: Normocephalic and atraumatic.  Mouth/Throat: Oropharynx is clear and moist. No oropharyngeal exudate.  Eyes: Conjunctivae and EOM are normal.  Neck: Normal  range of motion. Neck supple. Muscular tenderness present. No spinous process tenderness present. No rigidity. No tracheal deviation, no edema, no erythema and normal range of motion present. No thyromegaly present.  Cardiovascular: Normal rate, regular rhythm, normal heart sounds and intact distal pulses.  Exam reveals no gallop and no friction rub.   No murmur heard. Pulmonary/Chest: Effort normal and breath sounds normal. No respiratory distress. She has no wheezes. She has no rales.  Musculoskeletal: Normal range of motion.       Left shoulder: She exhibits tenderness, pain and spasm. She exhibits normal range of motion, no swelling, no effusion, no crepitus, no deformity, no laceration, normal pulse and normal strength.       Left wrist: She exhibits normal range of motion, no tenderness, no bony tenderness, no swelling, no effusion, no crepitus, no deformity and no laceration.       Left knee: She exhibits swelling. She exhibits normal range of motion, no effusion, no ecchymosis, no deformity, no laceration, no erythema, normal alignment, no LCL laxity, normal patellar mobility, no bony tenderness, normal meniscus and no MCL laxity. Tenderness found. Medial joint line and lateral joint line tenderness noted. No MCL, no LCL and no patellar tendon  tenderness noted.  Left rotator cuff intact. Normal ROM of the left wrist. Radial pulse intact. Left knee joint intact. No sensory deficit. Normal strength.   Lymphadenopathy:    She has no cervical adenopathy.  Neurological: She is alert and oriented to person, place, and time.  Skin: Skin is warm and dry. She is not diaphoretic.  Psychiatric: She has a normal mood and affect. Her behavior is normal.    ED Course  Procedures (including critical care time) DIAGNOSTIC STUDIES: Oxygen Saturation is 100% on room air, normal by my interpretation.    COORDINATION OF CARE: At 4:11 PM: Discussed treatment plan with patient which includes prescription for pain medication and muscle relaxer. Discussed x-ray was normal. Shoulder pain most likely due to muscle spasm and left knee pain is likely due to arthritis flare up. Patient agrees.   Labs Review Labs Reviewed - No data to display  Imaging Review Dg Knee 2 Views Left  07/05/2014   CLINICAL DATA:  Left knee pain status post fall.  EXAM: LEFT KNEE - 1-2 VIEW  COMPARISON:  None  FINDINGS: The bones are adequately mineralized. There is beaking of the tibial spines. There is no joint effusion. There is no acute fracture or dislocation.  IMPRESSION: There degenerative changes of the left knee. There is no acute bony abnormality.   Electronically Signed   By: David  Martinique   On: 07/05/2014 15:30   Dg Shoulder Left  07/05/2014   CLINICAL DATA:  Status post fall now with left shoulder pain  EXAM: LEFT SHOULDER - 2+ VIEW  COMPARISON:  Limited views of the left shoulder on a chest x-ray of November 08, 2012  FINDINGS: The bones are adequately mineralized. There is no acute fracture nor dislocation. Mild degenerative AC joint change is present. The glenohumeral joint is unremarkable.  IMPRESSION: There is no acute bony abnormality of the left shoulder.   Electronically Signed   By: David  Martinique   On: 07/05/2014 15:29     EKG Interpretation None       MDM   Final diagnoses:  Neck muscle spasm  Left knee pain   Patient is a chronic pain patient who presents with fall and left knee and shoulder pain.  Physical exam at this  time reveals spasm of the trapezius and tenderness over the joint line.  Rotator cuff appears to be intact here on physical examination.  There is cervical bony tenderness at this time.  Patient had plain film xrays of the left knee and of the left shoulder.  No acute fractures were found at this time.  Will treat the patient here in the ED with percocet and will send her home with 2 days of percocet and flexeril for pain management and spasm.  Patient will be given a referral to follow-up with Dr. Mardelle Matte who is the orthopedist who performed her right knee surgery.  Patient states understanding and agreement with the above plan.  She was told to return for sudden collapse and septic joint symptoms.  She states understanding at this time.    I personally performed the services described in this documentation, which was scribed in my presence. The recorded information has been reviewed and is accurate.     Cherylann Parr, PA-C 07/05/14 [phone]

## 2014-07-05 NOTE — ED Provider Notes (Signed)
Medical screening examination/treatment/procedure(s) were performed by non-physician practitioner and as supervising physician I was immediately available for consultation/collaboration.   Leota Jacobsen, MD 07/05/14 [phone]

## 2014-07-05 NOTE — Discharge Instructions (Signed)
Wear and Tear Disorders of the Knee (Arthritis, Osteoarthritis) Everyone will experience wear and tear injuries (arthritis, osteoarthritis) of the knee. These are the changes we all get as we age. They come from the joint stress of daily living. The amount of cartilage damage in your knee and your symptoms determine if you need surgery. Mild problems require approximately two months recovery time. More severe problems take several months to recover. With mild problems, your surgeon may find worn and rough cartilage surfaces. With severe changes, your surgeon may find cartilage that has completely worn away and exposed the bone. Loose bodies of bone and cartilage, bone spurs (excess bone growth), and injuries to the menisci (cushions between the large bones of your leg) are also common. All of these problems can cause pain. For a mild wear and tear problem, rough cartilage may simply need to be shaved and smoothed. For more severe problems with areas of exposed bone, your surgeon may use an instrument for roughing up the bone surfaces to stimulate new cartilage growth. Loose bodies are usually removed. Torn menisci may be trimmed or repaired. ABOUT THE ARTHROSCOPIC PROCEDURE Arthroscopy is a surgical technique. It allows your orthopedic surgeon to diagnose and treat your knee injury with accuracy. The surgeon looks into your knee through a small scope. The scope is like a small (pencil-sized) telescope. Arthroscopy is less invasive than open knee surgery. You can expect a more rapid recovery. After the procedure, you will be moved to a recovery area until most of the effects of the medication have worn off. Your caregiver will discuss the test results with you. RECOVERY The severity of the arthritis and the type of procedure performed will determine recovery time. Other important factors include age, physical condition, medical conditions, and the type of rehabilitation program. Strengthening your muscles after  arthroscopy helps guarantee a better recovery. Follow your caregiver's instructions. Use crutches, rest, elevate, ice, and do knee exercises as instructed. Your caregivers will help you and instruct you with exercises and other physical therapy required to regain your mobility, muscle strength, and functioning following surgery. Only take over-the-counter or prescription medicines for pain, discomfort, or fever as directed by your caregiver.  SEEK MEDICAL CARE IF:   There is increased bleeding (more than a small spot) from the wound.  You notice redness, swelling, or increasing pain in the wound.  Pus is coming from wound.  You develop an unexplained oral temperature above 102 F (38.9 C) , or as your caregiver suggests.  You notice a foul smell coming from the wound or dressing.  You have severe pain with motion of the knee. SEEK IMMEDIATE MEDICAL CARE IF:   You develop a rash.  You have difficulty breathing.  You have any allergic problems. MAKE SURE YOU:   Understand these instructions.  Will watch your condition.  Will get help right away if you are not doing well or get worse. Document Released: 12/04/2000 Document Revised: 02/29/2012 Document Reviewed: 05/02/2008 [facility] Patient Information 2015 [place], [place]. This information is not intended to replace advice given to you by your health care provider. Make sure you discuss any questions you have with your health care provider.  Muscle Cramps and Spasms Muscle cramps and spasms occur when a muscle or muscles tighten and you have no control over this tightening (involuntary muscle contraction). They are a common problem and can develop in any muscle. The most common place is in the calf muscles of the leg. Both muscle cramps and muscle  spasms are involuntary muscle contractions, but they also have differences:   Muscle cramps are sporadic and painful. They may last a few seconds to a quarter of an hour. Muscle cramps are  often more forceful and last longer than muscle spasms.  Muscle spasms may or may not be painful. They may also last just a few seconds or much longer. CAUSES  It is uncommon for cramps or spasms to be due to a serious underlying problem. In many cases, the cause of cramps or spasms is unknown. Some common causes are:   Overexertion.   Overuse from repetitive motions (doing the same thing over and over).   Remaining in a certain position for a long period of time.   Improper preparation, form, or technique while performing a sport or activity.   Dehydration.   Injury.   Side effects of some medicines.   Abnormally low levels of the salts and ions in your blood (electrolytes), especially potassium and calcium. This could happen if you are taking water pills (diuretics) or you are pregnant.  Some underlying medical problems can make it more likely to develop cramps or spasms. These include, but are not limited to:   Diabetes.   Parkinson disease.   Hormone disorders, such as thyroid problems.   Alcohol abuse.   Diseases specific to muscles, joints, and bones.   Blood vessel disease where not enough blood is getting to the muscles.  HOME CARE INSTRUCTIONS   Stay well hydrated. Drink enough water and fluids to keep your urine clear or pale yellow.  It may be helpful to massage, stretch, and relax the affected muscle.  For tight or tense muscles, use a warm towel, heating pad, or hot shower water directed to the affected area.  If you are sore or have pain after a cramp or spasm, applying ice to the affected area may relieve discomfort.  Put ice in a plastic bag.  Place a towel between your skin and the bag.  Leave the ice on for 15-20 minutes, 03-04 times a day.  Medicines used to treat a known cause of cramps or spasms may help reduce their frequency or severity. Only take over-the-counter or prescription medicines as directed by your caregiver. SEEK  MEDICAL CARE IF:  Your cramps or spasms get more severe, more frequent, or do not improve over time.  MAKE SURE YOU:   Understand these instructions.  Will watch your condition.  Will get help right away if you are not doing well or get worse. Document Released: 05/29/2002 Document Revised: 04/03/2013 Document Reviewed: 11/23/2012 [facility] Patient Information 2015 [place], [place]. This information is not intended to replace advice given to you by your health care provider. Make sure you discuss any questions you have with your health care provider.

## 2014-08-19 ENCOUNTER — Inpatient Hospital Stay (HOSPITAL_COMMUNITY)
Admission: EM | Admit: 2014-08-19 | Discharge: 2014-08-24 | DRG: 871 | Disposition: A | Discharge status: Home / Self Care | Payer: Medicaid Other | Attending: Internal Medicine | Admitting: Internal Medicine

## 2014-08-19 ENCOUNTER — Emergency Department (HOSPITAL_COMMUNITY): Payer: Medicaid Other

## 2014-08-19 ENCOUNTER — Other Ambulatory Visit: Payer: Self-pay

## 2014-08-19 ENCOUNTER — Encounter (HOSPITAL_COMMUNITY): Payer: Self-pay | Admitting: Emergency Medicine

## 2014-08-19 DIAGNOSIS — Z885 Allergy status to narcotic agent status: Secondary | ICD-10-CM

## 2014-08-19 DIAGNOSIS — M519 Unspecified thoracic, thoracolumbar and lumbosacral intervertebral disc disorder: Secondary | ICD-10-CM | POA: Diagnosis present

## 2014-08-19 DIAGNOSIS — Z800 Family history of malignant neoplasm of digestive organs: Secondary | ICD-10-CM | POA: Diagnosis not present

## 2014-08-19 DIAGNOSIS — F329 Major depressive disorder, single episode, unspecified: Secondary | ICD-10-CM | POA: Diagnosis present

## 2014-08-19 DIAGNOSIS — R52 Pain, unspecified: Secondary | ICD-10-CM

## 2014-08-19 DIAGNOSIS — A481 Legionnaires' disease: Secondary | ICD-10-CM | POA: Diagnosis present

## 2014-08-19 DIAGNOSIS — N289 Disorder of kidney and ureter, unspecified: Secondary | ICD-10-CM | POA: Diagnosis present

## 2014-08-19 DIAGNOSIS — Z881 Allergy status to other antibiotic agents status: Secondary | ICD-10-CM

## 2014-08-19 DIAGNOSIS — Z87891 Personal history of nicotine dependence: Secondary | ICD-10-CM | POA: Diagnosis not present

## 2014-08-19 DIAGNOSIS — Z888 Allergy status to other drugs, medicaments and biological substances status: Secondary | ICD-10-CM

## 2014-08-19 DIAGNOSIS — Z833 Family history of diabetes mellitus: Secondary | ICD-10-CM | POA: Diagnosis not present

## 2014-08-19 DIAGNOSIS — M545 Low back pain, unspecified: Secondary | ICD-10-CM | POA: Diagnosis present

## 2014-08-19 DIAGNOSIS — K219 Gastro-esophageal reflux disease without esophagitis: Secondary | ICD-10-CM | POA: Diagnosis present

## 2014-08-19 DIAGNOSIS — Z825 Family history of asthma and other chronic lower respiratory diseases: Secondary | ICD-10-CM | POA: Diagnosis not present

## 2014-08-19 DIAGNOSIS — Z79899 Other long term (current) drug therapy: Secondary | ICD-10-CM

## 2014-08-19 DIAGNOSIS — Z8249 Family history of ischemic heart disease and other diseases of the circulatory system: Secondary | ICD-10-CM | POA: Diagnosis not present

## 2014-08-19 DIAGNOSIS — J9600 Acute respiratory failure, unspecified whether with hypoxia or hypercapnia: Secondary | ICD-10-CM | POA: Diagnosis present

## 2014-08-19 DIAGNOSIS — E669 Obesity, unspecified: Secondary | ICD-10-CM

## 2014-08-19 DIAGNOSIS — J189 Pneumonia, unspecified organism: Secondary | ICD-10-CM | POA: Diagnosis not present

## 2014-08-19 DIAGNOSIS — I10 Essential (primary) hypertension: Secondary | ICD-10-CM | POA: Diagnosis present

## 2014-08-19 DIAGNOSIS — K5730 Diverticulosis of large intestine without perforation or abscess without bleeding: Secondary | ICD-10-CM | POA: Diagnosis present

## 2014-08-19 DIAGNOSIS — E739 Lactose intolerance, unspecified: Secondary | ICD-10-CM | POA: Diagnosis present

## 2014-08-19 DIAGNOSIS — G8929 Other chronic pain: Secondary | ICD-10-CM | POA: Diagnosis not present

## 2014-08-19 DIAGNOSIS — J154 Pneumonia due to other streptococci: Secondary | ICD-10-CM | POA: Diagnosis present

## 2014-08-19 DIAGNOSIS — I70209 Unspecified atherosclerosis of native arteries of extremities, unspecified extremity: Secondary | ICD-10-CM | POA: Diagnosis present

## 2014-08-19 DIAGNOSIS — Z8041 Family history of malignant neoplasm of ovary: Secondary | ICD-10-CM

## 2014-08-19 DIAGNOSIS — Z91040 Latex allergy status: Secondary | ICD-10-CM

## 2014-08-19 DIAGNOSIS — A419 Sepsis, unspecified organism: Secondary | ICD-10-CM | POA: Diagnosis present

## 2014-08-19 DIAGNOSIS — IMO0002 Reserved for concepts with insufficient information to code with codable children: Secondary | ICD-10-CM

## 2014-08-19 DIAGNOSIS — J9601 Acute respiratory failure with hypoxia: Secondary | ICD-10-CM | POA: Diagnosis present

## 2014-08-19 DIAGNOSIS — M5090 Cervical disc disorder, unspecified, unspecified cervical region: Secondary | ICD-10-CM

## 2014-08-19 DIAGNOSIS — Z791 Long term (current) use of non-steroidal anti-inflammatories (NSAID): Secondary | ICD-10-CM | POA: Diagnosis not present

## 2014-08-19 DIAGNOSIS — Z6841 Body Mass Index (BMI) 40.0 and over, adult: Secondary | ICD-10-CM

## 2014-08-19 DIAGNOSIS — D72829 Elevated white blood cell count, unspecified: Secondary | ICD-10-CM | POA: Diagnosis present

## 2014-08-19 DIAGNOSIS — F411 Generalized anxiety disorder: Secondary | ICD-10-CM | POA: Diagnosis present

## 2014-08-19 DIAGNOSIS — F3289 Other specified depressive episodes: Secondary | ICD-10-CM

## 2014-08-19 DIAGNOSIS — F32A Depression, unspecified: Secondary | ICD-10-CM | POA: Diagnosis present

## 2014-08-19 LAB — MRSA PCR SCREENING: MRSA by PCR: NEGATIVE

## 2014-08-19 LAB — COMPREHENSIVE METABOLIC PANEL
ALK PHOS: 73 U/L (ref 39–117)
ALT: 33 U/L (ref 0–35)
ALT: 39 U/L — ABNORMAL HIGH (ref 0–35)
AST: 25 U/L (ref 0–37)
AST: 38 U/L — ABNORMAL HIGH (ref 0–37)
Albumin: 4 g/dL (ref 3.5–5.2)
Albumin: 3.5 g/dL (ref 3.5–5.2)
Alkaline Phosphatase: 65 U/L (ref 39–117)
Anion gap: 12 (ref 5–15)
Anion gap: 14 (ref 5–15)
BILIRUBIN TOTAL: 0.8 mg/dL (ref 0.3–1.2)
BUN: 12 mg/dL (ref 6–23)
BUN: 12 mg/dL (ref 6–23)
CO2: 28 mEq/L (ref 19–32)
CO2: 24 mEq/L (ref 19–32)
CREATININE: 0.89 mg/dL (ref 0.50–1.10)
Calcium: 9.5 mg/dL (ref 8.4–10.5)
Calcium: 7.7 mg/dL — ABNORMAL LOW (ref 8.4–10.5)
Chloride: 100 mEq/L (ref 96–112)
Chloride: 103 mEq/L (ref 96–112)
Creatinine, Ser: 0.93 mg/dL (ref 0.50–1.10)
GFR calc Af Amer: 82 mL/min — ABNORMAL LOW (ref >90)
GFR calc non Af Amer: 67 mL/min — ABNORMAL LOW (ref >90)
GFR, EST AFRICAN AMERICAN: 78 mL/min — AB (ref >90)
GFR, EST NON AFRICAN AMERICAN: 71 mL/min — AB (ref >90)
GLUCOSE: 130 mg/dL — AB (ref 70–99)
Glucose, Bld: 94 mg/dL (ref 70–99)
POTASSIUM: 4.1 meq/L (ref 3.7–5.3)
Potassium: 4.7 mEq/L (ref 3.7–5.3)
Sodium: 140 mEq/L (ref 137–147)
Sodium: 141 mEq/L (ref 137–147)
TOTAL PROTEIN: 7.6 g/dL (ref 6.0–8.3)
Total Bilirubin: 1.3 mg/dL — ABNORMAL HIGH (ref 0.3–1.2)
Total Protein: 6.5 g/dL (ref 6.0–8.3)

## 2014-08-19 LAB — CBC WITH DIFFERENTIAL/PLATELET
BASOS PCT: 0 % (ref 0–1)
BASOS PCT: 0 % (ref 0–1)
Basophils Absolute: 0 10*3/uL (ref 0.0–0.1)
Basophils Absolute: 0.1 10*3/uL (ref 0.0–0.1)
EOS PCT: 0 % (ref 0–5)
Eosinophils Absolute: 0.1 10*3/uL (ref 0.0–0.7)
Eosinophils Absolute: 0 10*3/uL (ref 0.0–0.7)
Eosinophils Relative: 1 % (ref 0–5)
HCT: 41 % (ref 36.0–46.0)
HCT: 37.8 % (ref 36.0–46.0)
HEMOGLOBIN: 12.9 g/dL (ref 12.0–15.0)
Hemoglobin: 14 g/dL (ref 12.0–15.0)
LYMPHS PCT: 14 % (ref 12–46)
Lymphocytes Relative: 9 % — ABNORMAL LOW (ref 12–46)
Lymphs Abs: 2.2 10*3/uL (ref 0.7–4.0)
Lymphs Abs: 1.4 10*3/uL (ref 0.7–4.0)
MCH: 31.7 pg (ref 26.0–34.0)
MCH: 31.9 pg (ref 26.0–34.0)
MCHC: 34.1 g/dL (ref 30.0–36.0)
MCHC: 34.1 g/dL (ref 30.0–36.0)
MCV: 92.8 fL (ref 78.0–100.0)
MCV: 93.6 fL (ref 78.0–100.0)
Monocytes Absolute: 0.8 10*3/uL (ref 0.1–1.0)
Monocytes Absolute: 0.9 10*3/uL (ref 0.1–1.0)
Monocytes Relative: 5 % (ref 3–12)
Monocytes Relative: 6 % (ref 3–12)
NEUTROS PCT: 80 % — AB (ref 43–77)
Neutro Abs: 12.6 10*3/uL — ABNORMAL HIGH (ref 1.7–7.7)
Neutro Abs: 13.2 10*3/uL — ABNORMAL HIGH (ref 1.7–7.7)
Neutrophils Relative %: 85 % — ABNORMAL HIGH (ref 43–77)
Platelets: 294 10*3/uL (ref 150–400)
Platelets: 228 10*3/uL (ref 150–400)
RBC: 4.42 MIL/uL (ref 3.87–5.11)
RBC: 4.04 MIL/uL (ref 3.87–5.11)
RDW: 12.4 % (ref 11.5–15.5)
RDW: 12.6 % (ref 11.5–15.5)
WBC: 15.7 10*3/uL — ABNORMAL HIGH (ref 4.0–10.5)
WBC: 15.5 10*3/uL — ABNORMAL HIGH (ref 4.0–10.5)

## 2014-08-19 LAB — INFLUENZA PANEL BY PCR (TYPE A & B)
H1N1FLUPCR: NOT DETECTED
Influenza A By PCR: NEGATIVE
Influenza B By PCR: NEGATIVE

## 2014-08-19 LAB — URINALYSIS, ROUTINE W REFLEX MICROSCOPIC
BILIRUBIN URINE: NEGATIVE
Glucose, UA: NEGATIVE mg/dL
Hgb urine dipstick: NEGATIVE
KETONES UR: NEGATIVE mg/dL
Leukocytes, UA: NEGATIVE
NITRITE: NEGATIVE
Protein, ur: NEGATIVE mg/dL
Specific Gravity, Urine: 1.009 (ref 1.005–1.030)
UROBILINOGEN UA: 0.2 mg/dL (ref 0.0–1.0)
pH: 6 (ref 5.0–8.0)

## 2014-08-19 LAB — I-STAT CG4 LACTIC ACID, ED: Lactic Acid, Venous: 1.75 mmol/L (ref 0.5–2.2)

## 2014-08-19 LAB — APTT: aPTT: 28 seconds (ref 24–37)

## 2014-08-19 LAB — HIV ANTIBODY (ROUTINE TESTING W REFLEX): HIV: NONREACTIVE

## 2014-08-19 LAB — PROTIME-INR
INR: 1.22 (ref 0.00–1.49)
Prothrombin Time: 15.4 seconds — ABNORMAL HIGH (ref 11.6–15.2)

## 2014-08-19 LAB — TSH: TSH: 2.44 u[IU]/mL (ref 0.350–4.500)

## 2014-08-19 LAB — MAGNESIUM: MAGNESIUM: 1.7 mg/dL (ref 1.5–2.5)

## 2014-08-19 LAB — PHOSPHORUS: Phosphorus: 3.3 mg/dL (ref 2.3–4.6)

## 2014-08-19 LAB — PROCALCITONIN: Procalcitonin: 1.38 ng/mL

## 2014-08-19 MED ORDER — SODIUM CHLORIDE 0.9 % IV BOLUS (SEPSIS)
1000.0000 mL | Freq: Once | INTRAVENOUS | Status: AC
  Administered 2014-08-19: 1000 mL via INTRAVENOUS

## 2014-08-19 MED ORDER — DEXTROSE 5 % IV SOLN
2.0000 g | Freq: Once | INTRAVENOUS | Status: AC
  Administered 2014-08-19: 2 g via INTRAVENOUS
  Filled 2014-08-19: qty 2

## 2014-08-19 MED ORDER — HYDROMORPHONE HCL PF 1 MG/ML IJ SOLN
1.0000 mg | Freq: Once | INTRAMUSCULAR | Status: AC
  Administered 2014-08-19: 1 mg via INTRAVENOUS
  Filled 2014-08-19: qty 1

## 2014-08-19 MED ORDER — SODIUM CHLORIDE 0.9 % IV BOLUS (SEPSIS)
30.0000 mL/kg | Freq: Once | INTRAVENOUS | Status: AC
  Administered 2014-08-19: 4218 mL via INTRAVENOUS
  Filled 2014-08-19: qty 4350

## 2014-08-19 MED ORDER — ACETAMINOPHEN 650 MG RE SUPP: 650.0000 mg | Freq: Four times a day (QID) | RECTAL | Status: DC | PRN

## 2014-08-19 MED ORDER — CYCLOBENZAPRINE HCL 10 MG PO TABS
10.0000 mg | ORAL_TABLET | Freq: Two times a day (BID) | ORAL | Status: DC | PRN
  Administered 2014-08-21 – 2014-08-23 (×4): 10 mg via ORAL
  Filled 2014-08-19 (×5): qty 1

## 2014-08-19 MED ORDER — CETYLPYRIDINIUM CHLORIDE 0.05 % MT LIQD
7.0000 mL | Freq: Two times a day (BID) | OROMUCOSAL | Status: DC
  Administered 2014-08-19 – 2014-08-24 (×10): 7 mL via OROMUCOSAL

## 2014-08-19 MED ORDER — LEVOFLOXACIN IN D5W 750 MG/150ML IV SOLN: 750.0000 mg | INTRAVENOUS | Status: DC

## 2014-08-19 MED ORDER — ONDANSETRON HCL 4 MG/2ML IJ SOLN
4.0000 mg | Freq: Four times a day (QID) | INTRAMUSCULAR | Status: DC | PRN
  Administered 2014-08-20 – 2014-08-21 (×5): 4 mg via INTRAVENOUS
  Filled 2014-08-19 (×5): qty 2

## 2014-08-19 MED ORDER — LORAZEPAM 2 MG/ML IJ SOLN
INTRAMUSCULAR | Status: AC
  Filled 2014-08-19: qty 1

## 2014-08-19 MED ORDER — HYDROCHLOROTHIAZIDE 25 MG PO TABS
25.0000 mg | ORAL_TABLET | Freq: Every day | ORAL | Status: DC
  Administered 2014-08-20: 25 mg via ORAL
  Filled 2014-08-19 (×2): qty 1

## 2014-08-19 MED ORDER — ALPRAZOLAM 0.5 MG PO TABS
0.5000 mg | ORAL_TABLET | Freq: Two times a day (BID) | ORAL | Status: DC | PRN
  Administered 2014-08-20 – 2014-08-23 (×6): 0.5 mg via ORAL
  Filled 2014-08-19 (×6): qty 1

## 2014-08-19 MED ORDER — LEVOFLOXACIN IN D5W 750 MG/150ML IV SOLN
750.0000 mg | Freq: Once | INTRAVENOUS | Status: AC
  Administered 2014-08-19: 750 mg via INTRAVENOUS
  Filled 2014-08-19: qty 150

## 2014-08-19 MED ORDER — HYDROMORPHONE HCL PF 1 MG/ML IJ SOLN
1.0000 mg | INTRAMUSCULAR | Status: DC | PRN
  Administered 2014-08-19 – 2014-08-20 (×6): 1 mg via INTRAVENOUS
  Filled 2014-08-19 (×6): qty 1

## 2014-08-19 MED ORDER — ACETAMINOPHEN 325 MG PO TABS
650.0000 mg | ORAL_TABLET | Freq: Four times a day (QID) | ORAL | Status: DC | PRN
  Administered 2014-08-19 – 2014-08-23 (×5): 650 mg via ORAL
  Filled 2014-08-19 (×5): qty 2

## 2014-08-19 MED ORDER — SODIUM CHLORIDE 0.9 % IJ SOLN
3.0000 mL | Freq: Two times a day (BID) | INTRAMUSCULAR | Status: DC
  Administered 2014-08-19 – 2014-08-24 (×10): 3 mL via INTRAVENOUS

## 2014-08-19 MED ORDER — LEVOFLOXACIN IN D5W 750 MG/150ML IV SOLN
750.0000 mg | INTRAVENOUS | Status: DC
  Administered 2014-08-20: 750 mg via INTRAVENOUS
  Filled 2014-08-19 (×2): qty 150

## 2014-08-19 MED ORDER — VANCOMYCIN HCL 10 G IV SOLR
2500.0000 mg | Freq: Once | INTRAVENOUS | Status: AC
  Administered 2014-08-19: 2500 mg via INTRAVENOUS
  Filled 2014-08-19: qty 2500

## 2014-08-19 MED ORDER — ALBUTEROL SULFATE (2.5 MG/3ML) 0.083% IN NEBU: 2.5000 mg | INHALATION_SOLUTION | RESPIRATORY_TRACT | Status: DC | PRN

## 2014-08-19 MED ORDER — LORAZEPAM 2 MG/ML IJ SOLN
1.0000 mg | Freq: Once | INTRAMUSCULAR | Status: AC
  Administered 2014-08-19: 1 mg via INTRAVENOUS

## 2014-08-19 MED ORDER — SODIUM CHLORIDE 0.9 % IV SOLN
1000.0000 mL | INTRAVENOUS | Status: DC
  Administered 2014-08-19 – 2014-08-20 (×2): 1000 mL via INTRAVENOUS

## 2014-08-19 MED ORDER — ACETAMINOPHEN 500 MG PO TABS
1000.0000 mg | ORAL_TABLET | Freq: Once | ORAL | Status: AC
  Administered 2014-08-19: 1000 mg via ORAL
  Filled 2014-08-19: qty 2

## 2014-08-19 MED ORDER — ACETAMINOPHEN 500 MG PO TABS: 1000.0000 mg | ORAL_TABLET | Freq: Once | ORAL | Status: DC

## 2014-08-19 MED ORDER — VANCOMYCIN HCL IN DEXTROSE 1-5 GM/200ML-% IV SOLN: 1000.0000 mg | Freq: Once | INTRAVENOUS | Status: DC

## 2014-08-19 MED ORDER — ALBUTEROL SULFATE HFA 108 (90 BASE) MCG/ACT IN AERS
2.0000 | INHALATION_SPRAY | RESPIRATORY_TRACT | Status: DC | PRN
Start: 2014-08-19 — End: 2014-08-19

## 2014-08-19 MED ORDER — ONDANSETRON HCL 4 MG PO TABS: 4.0000 mg | ORAL_TABLET | Freq: Four times a day (QID) | ORAL | Status: DC | PRN

## 2014-08-19 MED ORDER — IPRATROPIUM BROMIDE 0.02 % IN SOLN: 0.5000 mg | RESPIRATORY_TRACT | Status: DC | PRN

## 2014-08-19 MED ORDER — AZTREONAM 2 G IJ SOLR
2.0000 g | Freq: Three times a day (TID) | INTRAMUSCULAR | Status: DC
  Administered 2014-08-19 – 2014-08-20 (×4): 2 g via INTRAVENOUS
  Filled 2014-08-19 (×6): qty 2

## 2014-08-19 MED ORDER — VANCOMYCIN HCL 10 G IV SOLR
1500.0000 mg | Freq: Two times a day (BID) | INTRAVENOUS | Status: DC
  Administered 2014-08-19 – 2014-08-23 (×8): 1500 mg via INTRAVENOUS
  Filled 2014-08-19 (×10): qty 1500

## 2014-08-19 MED ORDER — SODIUM CHLORIDE 0.9 % IV SOLN: INTRAVENOUS | Status: DC

## 2014-08-19 NOTE — ED Notes (Signed)
Bed: WA09 Expected date:  Expected time:  Means of arrival:  Comments: Pt in MRI

## 2014-08-19 NOTE — ED Notes (Signed)
Pt returned from MRI. Unable to do MRI.

## 2014-08-19 NOTE — ED Notes (Signed)
Bed: WA09 Expected date:  Expected time:  Means of arrival:  Comments: EMS fever, chills, body aches

## 2014-08-19 NOTE — ED Notes (Signed)
Called [facility]. Informed of need for bariatric MRI. MRI says they will be able to see orders once pt is transferred.

## 2014-08-19 NOTE — H&P (Signed)
[facility] History and Physical  Maria [name] ATF:[id number] DOB: 1956-06-13 DOA: 08/19/2014  Referring physician: ER physician PCP: Maria Cower, MD   Chief Complaint: tremors, fever, chills  HPI:  58 year old female with past medical history of chronic back pain, thoracic and lumbosacral radiculitis and neuritis, morbid obesity, anxiety and depression who presented to [facility] ED 08/19/2014 with complaints of fevers, chills and back pain that radiated to lower extremities and causing weakness. Pain is 10/10 in intensity in the back mostly. Not relieved with analgesia taken at home. No falls. No chest pain, no palpitations, no shortness of breath. She has had some cough but not at this time. No abdominal pain, nausea or vomiting. In ED, initial vitals were stable except for a fever of 102.3 F. BP then dropped to 100/52 so due to concern for sepsis patient was started on broad spectrum antibiotics (regimen chosen due to abx allergies). Concern was for possible spinal infection so MRI of lumbar spine ordered but pt could not fit into MRI machine. Per radiologist in [facility], they accommodate the pt's body habitus so request was made for transfer to [facility]. Meanwhile patient had CXR done which showed mild patchy opacity in the right lung base concerning for possible pneumonia. Pt on broad spectrum abx: vanco, aztreonam and Levaquin.   Assessment & Plan    Principal Problem:   Sepsis likely due to pneumonia / leukocytosis  Sepsis criteria met with fever of 102.3 F, HR 102, RR 22, elevated WBC count of 15.7 and possible pneumonia seen on CXR. Lactic acid level  Was WNL. Check procalcitonin level.   Due to history of allergic reaction to amoxicillin, Augmentin recommended abx choice aztreonam, vanco and Levaquin. Will narrow down as blood culture results available, resp culture results available.   Follow up strep pneumonia, legionella, HIV and influenza results.   Obtain MRI of lumbar spinal to  rule out potential spinal infection.    DVT prophylaxis:   SCD's bilaterally   Radiological Exams on Admission:  Dg Chest Port 1 View 08/19/2014  1. Perhaps mild patchy opacity in the right lung base although this region is largely obscured by numerous cardiac leads overlying the chest. Recommend dedicated PA and lateral chest x-ray when the patient is able. 2. Stable mild cardiomegaly.     Code Status: Full Family Communication: Plan of care discussed with the patient  Disposition Plan: Admit for further evaluation; due to need to have MRI that can accommodate patient's body habitus patient needed to be transferred to [facility]. Radiologist in [facility] communicated this with Maria [name].  Maria Lenz, MD  [facility] Pager [phone]  Review of Systems:  Constitutional: positive for fever, chills and malaise/fatigue. Negative for diaphoresis.  HENT: Negative for hearing loss, ear pain, nosebleeds, congestion, sore throat, neck pain, tinnitus and ear discharge.   Eyes: Negative for blurred vision, double vision, photophobia, pain, discharge and redness.  Respiratory: positive for cough, no hemoptysis, sputum production, shortness of breath, wheezing and stridor.   Cardiovascular: Negative for chest pain, palpitations, orthopnea, claudication and leg swelling.  Gastrointestinal: Negative for nausea, vomiting and abdominal pain. Negative for heartburn, constipation, blood in stool and melena.  Genitourinary: Negative for dysuria, urgency, frequency, hematuria and flank pain.  Musculoskeletal: positive for myalgias, back pain, no joint pain and falls.  Skin: Negative for itching and rash.  Neurological: Negative for dizziness and positive for weakness. Negative for tingling, tremors, sensory change, speech change, focal weakness, loss of consciousness and headaches.  Endo/Heme/Allergies: Negative for environmental allergies and polydipsia. Does not bruise/bleed easily.  Psychiatric/Behavioral:  Negative for suicidal ideas. The patient is not nervous/anxious.      Past Medical History  Diagnosis Date  . Acute lymphadenitis 08/13/2009  . Acute pharyngitis 02/10/2010  . ALLERGIC RHINITIS 08/10/2007  . ANXIETY 12/06/2007  . BACK PAIN 12/06/2007  . CHEST PAIN 08/13/2009  . DEPRESSION 12/06/2007  . DIVERTICULOSIS, Maria [name] 12/06/2007  . GERD 12/06/2007  . HYPERTENSION 12/06/2007  . LOW BACK PAIN 12/06/2007  . MRSA 02/07/2009  . Pain in joint, multiple sites 04/21/2010  . PARONYCHIA, FINGER 08/13/2009  . RASH-NONVESICULAR 05/09/2008  . SHOULDER PAIN, LEFT 05/09/2008  . SINUSITIS- ACUTE-NOS 05/09/2008  . THORACIC/LUMBOSACRAL NEURITIS/RADICULITIS UNSPEC 12/28/2008  . Lumbar disc disease 03/09/2012  . Cervical disc disease 03/09/2012  . Chronic pain 03/09/2012  . Impaired glucose tolerance 03/13/2012  . Cellulitis   . Atherosclerotic peripheral vascular disease 06/13/2013    Aorta on CT June 2014   Past Surgical History  Procedure Laterality Date  . Abdominal hysterectomy    . S/p ovary cyst    . S/p right knee arthroscopy      Dr. Mardelle [name] ortho  . Cervical disc surgery     Social History:  reports that she quit smoking about 5 years ago. She has never used smokeless tobacco. She reports that she does not drink alcohol or use illicit drugs.  Allergies  Allergen Reactions  . Adhesive [Tape] Other (See Comments)    Tears skin off - use paper tape   . Amoxicillin-Pot Clavulanate Nausea And Vomiting  . Codeine Other (See Comments)    hallucinations  . Lactose Intolerance (Gi)     Unknown reaction   . Morphine And Related Nausea And Vomiting and Other (See Comments)    headaches  . Vicodin [Hydrocodone-Acetaminophen] Other (See Comments)    hallucinations  . Latex Rash    Family History:  Family History  Problem Relation Age of Onset  . Heart disease Mother   . Hypertension Mother   . Diabetes Mother   . Asthma Sister   . Asthma Daughter   . Cancer Maternal Uncle     Maria [name]  .  Cancer Other     ovarian     Prior to Admission medications   Medication Sig Start Date End Date Taking? Authorizing Provider  albuterol (PROVENTIL HFA;VENTOLIN HFA) 108 (90 BASE) MCG/ACT inhaler Inhale 2 puffs into the lungs every 4 (four) hours as needed for wheezing (cough, shortness of breath or wheezing.). 10/03/13  Yes Biagio Borg, MD  ALPRAZolam Duanne Moron) 0.5 MG tablet take 1 tablet by mouth twice a day if needed for anxiety 05/08/14  Yes Biagio Borg, MD  cyclobenzaprine (FLEXERIL) 10 MG tablet Take 1 tablet (10 mg total) by mouth 2 (two) times daily as needed for muscle spasms. 07/05/14  Yes Courtney A Forcucci, PA-C  hydrochlorothiazide (HYDRODIURIL) 25 MG tablet Take 25 mg by mouth daily with breakfast.    Yes Historical Provider, MD  ibuprofen (ADVIL,MOTRIN) 200 MG tablet Take 800 mg by mouth every 6 (six) hours as needed for moderate pain.   Yes Historical Provider, MD  OVER THE COUNTER MEDICATION Place 2 each into both eyes daily. For dry eyes   Yes Historical Provider, MD   Physical Exam: Filed Vitals:   08/19/14 1011 08/19/14 1015 08/19/14 1050 08/19/14 1054  BP:  133/54 118/48 118/48  Pulse: 101 99 102 98  Temp:    99.6 F (37.6 C)  TempSrc:    Oral  Resp: _0 Height:      Weight:      SpO2: 97% 98% 100%     Physical Exam  Constitutional: Appears in distress, morbidly obese  HENT: Normocephalic. No tonsillar erythema or exudates Eyes: Conjunctivae and EOM are normal. PERRLA, no scleral icterus.  Neck: Normal ROM. Neck supple. No JVD. No tracheal deviation.  CVS: RRR, S1/S2 +, no murmurs, no gallops, no carotid bruit.  Pulmonary: Effort and breath sounds normal, no stridor, rhonchi, wheezes, rales.  Abdominal: Soft. BS +,  no distension, tenderness, rebound or guarding.  Musculoskeletal: able to lift extremities against gravity, no tenderness  Lymphadenopathy: No lymphadenopathy noted, cervical, inguinal. Neuro: Alert. Normal reflexes, muscle tone  coordination. No focal neurologic deficits. Skin: Skin is warm and dry. No rash noted. Not diaphoretic.  Psychiatric: Normal mood and affect. Behavior, judgment, thought content normal.   Labs on Admission:  Basic Metabolic Panel:  Recent Labs Lab 08/19/14 0804  NA 140  K 4.1  CL 100  CO2 28  GLUCOSE 130*  BUN 12  CREATININE 0.93  CALCIUM 9.5   Liver Function Tests:  Recent Labs Lab 08/19/14 0804  AST 25  ALT 33  ALKPHOS 73  BILITOT 0.8  PROT 7.6  ALBUMIN 4.0   No results found for this basename: LIPASE, AMYLASE,  in the last 168 hours No results found for this basename: AMMONIA,  in the last 168 hours CBC:  Recent Labs Lab 08/19/14 0804  WBC 15.7*  NEUTROABS 12.6*  HGB 14.0  HCT 41.0  MCV 92.8  PLT 294   Cardiac Enzymes: No results found for this basename: CKTOTAL, CKMB, CKMBINDEX, TROPONINI,  in the last 168 hours BNP: No components found with this basename: POCBNP,  CBG: No results found for this basename: GLUCAP,  in the last 168 hours  If 7PM-7AM, please contact night-coverage [email or web address] Password [email or web address] 08/19/2014, 11:04 AM

## 2014-08-19 NOTE — Progress Notes (Signed)
ANTIBIOTIC CONSULT NOTE - INITIAL  Pharmacy Consult for Vancomycin, Aztreonam, Levaquin Indication: rule out sepsis  Allergies  Allergen Reactions  . Adhesive [Tape] Other (See Comments)    Tears skin off - use paper tape   . Amoxicillin-Pot Clavulanate Nausea And Vomiting  . Codeine Other (See Comments)    hallucinations  . Lactose Intolerance (Gi)     Unknown reaction   . Morphine And Related Nausea And Vomiting and Other (See Comments)    headaches  . Vicodin [Hydrocodone-Acetaminophen] Other (See Comments)    hallucinations  . Latex Rash    Patient Measurements: Height: 5\' 5"  (165.1 cm) Weight: 310 lb (140.615 kg) IBW/kg (Calculated) : 57  Vital Signs: Temp: 102.3 F (39.1 C) (08/30 0836) Temp src: Oral (08/30 0836) BP: 176/71 mmHg (08/30 0845) Pulse Rate: 103 (08/30 0845) Intake/Output from previous day:    Labs:  Recent Labs  08/19/14 0804  WBC 15.7*  HGB 14.0  PLT 294  CREATININE 0.93   Estimated Creatinine Clearance: 95.2 ml/min (by C-G formula based on Cr of 0.93). No results found for this basename: VANCOTROUGH, VANCOPEAK, VANCORANDOM, GENTTROUGH, GENTPEAK, GENTRANDOM, TOBRATROUGH, TOBRAPEAK, TOBRARND, AMIKACINPEAK, AMIKACINTROU, AMIKACIN,  in the last 72 hours   Microbiology: No results found for this or any previous visit (from the past 720 hour(s)).  Medical History: Past Medical History  Diagnosis Date  . Acute lymphadenitis 08/13/2009  . Acute pharyngitis 02/10/2010  . ALLERGIC RHINITIS 08/10/2007  . ANXIETY 12/06/2007  . BACK PAIN 12/06/2007  . CHEST PAIN 08/13/2009  . DEPRESSION 12/06/2007  . DIVERTICULOSIS, [place] 12/06/2007  . GERD 12/06/2007  . HYPERTENSION 12/06/2007  . LOW BACK PAIN 12/06/2007  . MRSA 02/07/2009  . Pain in joint, multiple sites 04/21/2010  . PARONYCHIA, FINGER 08/13/2009  . RASH-NONVESICULAR 05/09/2008  . SHOULDER PAIN, LEFT 05/09/2008  . SINUSITIS- ACUTE-NOS 05/09/2008  . THORACIC/LUMBOSACRAL NEURITIS/RADICULITIS  UNSPEC 12/28/2008  . Lumbar disc disease 03/09/2012  . Cervical disc disease 03/09/2012  . Chronic pain 03/09/2012  . Impaired glucose tolerance 03/13/2012  . Cellulitis   . Atherosclerotic peripheral vascular disease 06/13/2013    Aorta on CT June 2014     Assessment: 32 yoF admitted 8/30 from home with fever/chills, suspected sepsis.  Pharmacy is consulted to dose vancomycin, aztreonam, and levaquin.  8/30 >> Vanc >> 8/30 >> Aztreonam >> 8/30 >> Levaquin >>    Tmax: 100.3 WBCs: 15.7 Renal: SCr 0.93, CrCl ~ 95 CG, ~ 76 N  8/30 blood x2: sent 8/30 urine: sent   Goal of Therapy:  Vancomycin trough level 15-20 mcg/ml Appropriate abx dosing, eradication of infection.   Plan:   Aztreonam 2g IV q8h  Levaquin 750mg  IV q24h  Vancomycin 2500mg  IV x1 loading dose, then 1500mg  IV q12h.  Measure Vanc trough at steady state.  Follow up renal fxn and culture results.   Gretta Arab PharmD, BCPS Pager [phone] 08/19/2014 9:18 AM

## 2014-08-19 NOTE — ED Provider Notes (Addendum)
CSN: [id number]     Arrival date & time 08/19/14  0658 History   First MD Initiated Contact with Patient 08/19/14 0703     Chief Complaint  Patient presents with  . Fever  . Chills  . Generalized Body Aches     (Consider location/radiation/quality/duration/timing/severity/associated sxs/prior Treatment) Patient is a 58 y.o. female presenting with fever.  Fever Associated symptoms: chills and cough    Patient brought by EMS. She awakened at 4 AM today with shaking chills, subjective fever and low back pain radiating to both knees and diffuse myalgias. She was able to walk, with some difficulty. Other associated symptoms include generalized weakness. Minimal cough. No shortness of breath. Denies abdominal pain. No treatment prior to coming here pain is worse with walking improved with remains still. No loss of bladder or bowel control. No other associated symptoms. Patient suffers from chronic back pain and chronic right knee pain Past Medical History  Diagnosis Date  . Acute lymphadenitis 08/13/2009  . Acute pharyngitis 02/10/2010  . ALLERGIC RHINITIS 08/10/2007  . ANXIETY 12/06/2007  . BACK PAIN 12/06/2007  . CHEST PAIN 08/13/2009  . DEPRESSION 12/06/2007  . DIVERTICULOSIS, [place] 12/06/2007  . GERD 12/06/2007  . HYPERTENSION 12/06/2007  . LOW BACK PAIN 12/06/2007  . MRSA 02/07/2009  . Pain in joint, multiple sites 04/21/2010  . PARONYCHIA, FINGER 08/13/2009  . RASH-NONVESICULAR 05/09/2008  . SHOULDER PAIN, LEFT 05/09/2008  . SINUSITIS- ACUTE-NOS 05/09/2008  . THORACIC/LUMBOSACRAL NEURITIS/RADICULITIS UNSPEC 12/28/2008  . Lumbar disc disease 03/09/2012  . Cervical disc disease 03/09/2012  . Chronic pain 03/09/2012  . Impaired glucose tolerance 03/13/2012  . Cellulitis   . Atherosclerotic peripheral vascular disease 06/13/2013    Aorta on CT June 2014   Past Surgical History  Procedure Laterality Date  . Abdominal hysterectomy    . S/p ovary cyst    . S/p right knee arthroscopy      Dr.  Mardelle Matte ortho  . Cervical disc surgery     Family History  Problem Relation Age of Onset  . Heart disease Mother   . Hypertension Mother   . Diabetes Mother   . Asthma Sister   . Asthma Daughter   . Cancer Maternal Uncle     [place]  . Cancer Other     ovarian   History  Substance Use Topics  . Smoking status: Former Smoker -- 30 years    Quit date: 11/08/2008  . Smokeless tobacco: Never Used     Comment: quit 10/09  . Alcohol Use: No   OB History   Grav Para Term Preterm Abortions TAB SAB Ect Mult Living                 Review of Systems  Constitutional: Positive for fever and chills.  HENT: Negative.   Respiratory: Positive for cough.   Cardiovascular: Negative.   Gastrointestinal: Negative.   Musculoskeletal: Positive for arthralgias, back pain and gait problem.       Walks with cane  Skin: Negative.   Neurological: Positive for weakness.  Psychiatric/Behavioral: Negative.   All other systems reviewed and are negative.     Allergies  Amoxicillin-pot clavulanate; Codeine; Gluten meal; Lactose intolerance (gi); Morphine and related; Vicodin; and Latex  Home Medications  Medications include hydrochlorothiazide and alprazolam. She is on no other medications Prior to Admission medications   Medication Sig Start Date End Date Taking? Authorizing Provider  albuterol (PROVENTIL HFA;VENTOLIN HFA) 108 (90 BASE) MCG/ACT inhaler Inhale 2 puffs into the  lungs every 4 (four) hours as needed for wheezing (cough, shortness of breath or wheezing.). 10/03/13   Biagio Borg, MD  ALPRAZolam Duanne Moron) 0.5 MG tablet take 1 tablet by mouth twice a day if needed for anxiety 05/08/14   Biagio Borg, MD  clonazePAM (KLONOPIN) 1 MG tablet Take 0.5 mg by mouth 2 (two) times daily as needed for anxiety.    Historical Provider, MD  cyclobenzaprine (FLEXERIL) 10 MG tablet Take 1 tablet (10 mg total) by mouth 2 (two) times daily as needed for muscle spasms. 07/05/14   Courtney A Forcucci, PA-C   cyclobenzaprine (FLEXERIL) 5 MG tablet Take 1 tablet (5 mg total) by mouth 3 (three) times daily as needed for muscle spasms. 08/15/13   Biagio Borg, MD  hydrochlorothiazide (HYDRODIURIL) 25 MG tablet Take 25 mg by mouth daily.    Historical Provider, MD  ibuprofen (ADVIL,MOTRIN) 200 MG tablet Take 800 mg by mouth every 6 (six) hours as needed for moderate pain.    Historical Provider, MD  meloxicam (MOBIC) 7.5 MG tablet Take 2 tablets (15 mg total) by mouth daily. Take daily for 7 days, then as needed for pain. 05/19/14   Noland Fordyce, PA-C  OVER THE COUNTER MEDICATION Place 2 each into both eyes daily. For dry eyes    Historical Provider, MD  oxyCODONE-acetaminophen (PERCOCET/ROXICET) 5-325 MG per tablet Take 1 tablet by mouth every 6 (six) hours as needed for moderate pain or severe pain. 07/05/14   Courtney A Forcucci, PA-C   BP 158/60  Pulse 102  Temp(Src) 100.3 F (37.9 C) (Oral)  Resp 22  Ht 5\' 5"  (1.651 m)  Wt 310 lb (140.615 kg)  BMI 51.59 kg/m2  SpO2 95% Physical Exam  Nursing note and vitals reviewed. Constitutional: She appears well-developed and well-nourished. She appears distressed.  Appears uncomfortable, Glasgow Coma Score 15  HENT:  Head: Normocephalic and atraumatic.  Eyes: Conjunctivae are normal. Pupils are equal, round, and reactive to light.  Neck: Neck supple. No tracheal deviation present. No thyromegaly present.  Cardiovascular: Normal rate and regular rhythm.   No murmur heard. Pulmonary/Chest: Effort normal and breath sounds normal.  Abdominal: Soft. Bowel sounds are normal. She exhibits no distension. There is no tenderness.  Morbidly obese  Musculoskeletal: Normal range of motion. She exhibits no edema and no tenderness.  No point tenderness along spine. Pain at lumbar area when she sits up from a supine position. All 4 extremities no redness swelling or tenderness neurovascularly intact  Neurological: She is alert. Coordination normal.  Skin: Skin is  warm and dry. No rash noted.  Psychiatric: She has a normal mood and affect.    ED Course  Procedures (including critical care time) Labs Review Labs Reviewed - No data to display  Imaging Review No results found.   EKG Interpretation None     At 836 and temperature noted to be 102.3. Patient reports pain improves his treatment with intravenous opioids. Tylenol ordered.  12:10 PM patient is resting comfortably. After treatmt with iv opiods, iv fluids and antibiotics.Of note her blood pressure had dropped to 100/52 at 10:30 AM. Additional IV fluid bolus normal saline 1 L was ordered Results for orders placed during the hospital encounter of 08/19/14  URINALYSIS, ROUTINE W REFLEX MICROSCOPIC      Result Value Ref Range   Color, Urine YELLOW  YELLOW   APPearance CLEAR  CLEAR   Specific Gravity, Urine 1.009  1.005 - 1.030   pH 6.0  5.0 - 8.0   Glucose, UA NEGATIVE  NEGATIVE mg/dL   Hgb urine dipstick NEGATIVE  NEGATIVE   Bilirubin Urine NEGATIVE  NEGATIVE   Ketones, ur NEGATIVE  NEGATIVE mg/dL   Protein, ur NEGATIVE  NEGATIVE mg/dL   Urobilinogen, UA 0.2  0.0 - 1.0 mg/dL   Nitrite NEGATIVE  NEGATIVE   Leukocytes, UA NEGATIVE  NEGATIVE  COMPREHENSIVE METABOLIC PANEL      Result Value Ref Range   Sodium 140  137 - 147 mEq/L   Potassium 4.1  3.7 - 5.3 mEq/L   Chloride 100  96 - 112 mEq/L   CO2 28  19 - 32 mEq/L   Glucose, Bld 130 (*) 70 - 99 mg/dL   BUN 12  6 - 23 mg/dL   Creatinine, Ser 0.93  0.50 - 1.10 mg/dL   Calcium 9.5  8.4 - 10.5 mg/dL   Total Protein 7.6  6.0 - 8.3 g/dL   Albumin 4.0  3.5 - 5.2 g/dL   AST 25  0 - 37 U/L   ALT 33  0 - 35 U/L   Alkaline Phosphatase 73  39 - 117 U/L   Total Bilirubin 0.8  0.3 - 1.2 mg/dL   GFR calc non Af Amer 67 (*) >90 mL/min   GFR calc Af Amer 78 (*) >90 mL/min   Anion gap 12  5 - 15  CBC WITH DIFFERENTIAL      Result Value Ref Range   WBC 15.7 (*) 4.0 - 10.5 K/uL   RBC 4.42  3.87 - 5.11 MIL/uL   Hemoglobin 14.0  12.0 - 15.0  g/dL   HCT 41.0  36.0 - 46.0 %   MCV 92.8  78.0 - 100.0 fL   MCH 31.7  26.0 - 34.0 pg   MCHC 34.1  30.0 - 36.0 g/dL   RDW 12.4  11.5 - 15.5 %   Platelets 294  150 - 400 K/uL   Neutrophils Relative % 80 (*) 43 - 77 %   Neutro Abs 12.6 (*) 1.7 - 7.7 K/uL   Lymphocytes Relative 14  12 - 46 %   Lymphs Abs 2.2  0.7 - 4.0 K/uL   Monocytes Relative 5  3 - 12 %   Monocytes Absolute 0.8  0.1 - 1.0 K/uL   Eosinophils Relative 1  0 - 5 %   Eosinophils Absolute 0.1  0.0 - 0.7 K/uL   Basophils Relative 0  0 - 1 %   Basophils Absolute 0.0  0.0 - 0.1 K/uL  I-STAT CG4 LACTIC ACID, ED      Result Value Ref Range   Lactic Acid, Venous 1.75  0.5 - 2.2 mmol/L   Dg Chest Port 1 View  08/19/2014   CLINICAL DATA:  Fever and chills  EXAM: PORTABLE CHEST - 1 VIEW  COMPARISON:  Prior chest x-ray 08/17/2013  FINDINGS: Artifact in the form of numerous cardiac leads overlies the chest particularly obscuring the right lung base.  Stable cardiomegaly. Perhaps trace patchy airspace opacity in the right lower lobe. Otherwise, no airspace consolidation, pulmonary edema, pleural effusion or evidence of pneumothorax. No acute osseous abnormality. Incompletely imaged anterior cervical fusion hardware.  IMPRESSION: 1. Perhaps mild patchy opacity in the right lung base although this region is largely obscured by numerous cardiac leads overlying the chest. Recommend dedicated PA and lateral chest x-ray when the patient is able. 2. Stable mild cardiomegaly.   Electronically Signed   By: Dellis Filbert.D.  On: 08/19/2014 08:32   Chest xray viewed by me MDM  Code sepsis called based on 2 sirs criteria, pulse and respiratory rate. Source may be her back. Unclear presently. Patient does suffer from chronic pain. She is working to get in to pain clinic, to be arranged by her primary care physician Dr. [name] Reichmann. MRI L-spine ordered to check for infection Final diagnoses:  None   Patient could not fit into the MRI scanner at this  hospital. I spoke with Dr. Jola Baptist ,, radiologist. The MRI scanner at [facility] can accommodate the patient's body habitus. Spoke with Dr. Charlies Silvers. Patient will be transferred to [facility] sent down unit Diagnosis #1 sepsis #2 back pain #3 hyperglycemia   CRITICAL CARE Performed by: Orlie Dakin Total critical care time: 45 minute Critical care time was exclusive of separately billable procedures and treating other patients. Critical care was necessary to treat or prevent imminent or life-threatening deterioration. Critical care was time spent personally by me on the following activities: development of treatment plan with patient and/or surrogate as well as nursing, discussions with consultants, evaluation of patient's response to treatment, examination of patient, obtaining history from patient or surrogate, ordering and performing treatments and interventions, ordering and review of laboratory studies, ordering and review of radiographic studies, pulse oximetry and re-evaluation of patient's condition.  Orlie Dakin, MD 08/19/14 [place], MD 08/19/14 [phone]

## 2014-08-19 NOTE — ED Notes (Signed)
Per EMS, pt. Is from home with complaint of fever with chills which started at 4am this morning, pt. Also complaint of generalized body aches, denies SOB.

## 2014-08-19 NOTE — ED Notes (Signed)
Pt transported to MRI 

## 2014-08-20 ENCOUNTER — Inpatient Hospital Stay (HOSPITAL_COMMUNITY): Payer: Medicaid Other

## 2014-08-20 DIAGNOSIS — J9601 Acute respiratory failure with hypoxia: Secondary | ICD-10-CM | POA: Diagnosis present

## 2014-08-20 LAB — COMPREHENSIVE METABOLIC PANEL
ALBUMIN: 2.8 g/dL — AB (ref 3.5–5.2)
ALT: 36 U/L — ABNORMAL HIGH (ref 0–35)
AST: 32 U/L (ref 0–37)
Alkaline Phosphatase: 62 U/L (ref 39–117)
Anion gap: 10 (ref 5–15)
BILIRUBIN TOTAL: 1 mg/dL (ref 0.3–1.2)
BUN: 13 mg/dL (ref 6–23)
CO2: 23 mEq/L (ref 19–32)
CREATININE: 0.91 mg/dL (ref 0.50–1.10)
Calcium: 7.6 mg/dL — ABNORMAL LOW (ref 8.4–10.5)
Chloride: 108 mEq/L (ref 96–112)
GFR calc Af Amer: 80 mL/min — ABNORMAL LOW (ref >90)
GFR calc non Af Amer: 69 mL/min — ABNORMAL LOW (ref >90)
GLUCOSE: 101 mg/dL — AB (ref 70–99)
Potassium: 3.8 mEq/L (ref 3.7–5.3)
Sodium: 141 mEq/L (ref 137–147)
TOTAL PROTEIN: 5.8 g/dL — AB (ref 6.0–8.3)

## 2014-08-20 LAB — LEGIONELLA ANTIGEN, URINE: LEGIONELLA ANTIGEN, URINE: NEGATIVE

## 2014-08-20 LAB — URINE CULTURE
COLONY COUNT: NO GROWTH
CULTURE: NO GROWTH
SPECIAL REQUESTS: NORMAL

## 2014-08-20 LAB — CBC
HEMATOCRIT: 35 % — AB (ref 36.0–46.0)
HEMOGLOBIN: 11.5 g/dL — AB (ref 12.0–15.0)
MCH: 31.3 pg (ref 26.0–34.0)
MCHC: 32.9 g/dL (ref 30.0–36.0)
MCV: 95.1 fL (ref 78.0–100.0)
Platelets: 212 10*3/uL (ref 150–400)
RBC: 3.68 MIL/uL — ABNORMAL LOW (ref 3.87–5.11)
RDW: 12.9 % (ref 11.5–15.5)
WBC: 9.4 10*3/uL (ref 4.0–10.5)

## 2014-08-20 LAB — STREP PNEUMONIAE URINARY ANTIGEN: Strep Pneumo Urinary Antigen: NEGATIVE

## 2014-08-20 LAB — GLUCOSE, CAPILLARY: GLUCOSE-CAPILLARY: 89 mg/dL (ref 70–99)

## 2014-08-20 MED ORDER — ENSURE COMPLETE PO LIQD
237.0000 mL | Freq: Every day | ORAL | Status: DC
  Administered 2014-08-20 – 2014-08-24 (×5): 237 mL via ORAL

## 2014-08-20 MED ORDER — OXYCODONE HCL 5 MG PO TABS
5.0000 mg | ORAL_TABLET | ORAL | Status: DC | PRN
  Administered 2014-08-20 (×2): 10 mg via ORAL
  Administered 2014-08-21: 5 mg via ORAL
  Administered 2014-08-21 – 2014-08-24 (×12): 10 mg via ORAL
  Filled 2014-08-20 (×15): qty 2

## 2014-08-20 MED ORDER — GADOBENATE DIMEGLUMINE 529 MG/ML IV SOLN
20.0000 mL | Freq: Once | INTRAVENOUS | Status: AC | PRN
  Administered 2014-08-20: 20 mL via INTRAVENOUS

## 2014-08-20 NOTE — Progress Notes (Signed)
Utilization Review Completed.  

## 2014-08-20 NOTE — Progress Notes (Signed)
[facility]  Maria [name] KWI:[id number] DOB: Mar 15, 1956 DOA: 08/19/2014 PCP: Cathlean Cower, MD  Brief narrative:  58 year old female patient with past medical history of chronic back pain, primarily thoracic and lumbar sacral radiculitis and neuritis. She also has morbid obesity weighing 310 pounds, as well as anxiety and depression. She initially presented to the [facility] emergency department on 8/30 with complaints of fever chills and back pain that radiated to her lower shin it is in cause weakness. She rated the pain at 10/10. No relief with the usual analgesics at home. No falls or trauma. She has had some intermittent cough but no shortness of breath.   In the emergency department she initially was hemodynamically stable except for a temperature of 102.3. But later her blood pressure dropped to 100/52 and there was concern for sepsis the patient was started on broad-spectrum antibiotics. Because of her presentation with back pain there was concern for possible spinal infection so an MRI of the lumbar spine was ordered but due to the patient's obesity she was unable to fit into the MRI scanner at [facility] reported [facility] that she would be able to fit into the scanner at this facility so she was subsequently transferred to [facility]. In the interim gentleman a chest x-ray which questioned a possible mild patch of pasty in the right lung base concerning for possible pneumonia.   Since admission patient has continued with some back pain without any associated neurological symptoms other than subjective reports of weakness and tingling in the legs. The MRI was completed that shows no change from the previous MRI in May of 2014 i.e. there was no evidence for infection such as abscess or discitis.  Assessment/Plan:    Sepsis -Sepsis physiology has essentially resolved-source appears to be pulmonary-continue empiric antibiotics and  other supportive care-initial Procalcitonin was 1.38 which is suspicious for infection    Acute respiratory failure with hypoxia/? CAP vs viral syndrome -Chest x-ray borderline in regards to definitive infiltrate-patient is requiring oxygen and did report some coughing prior to admission-white count has improved significantly with antibiotic-continue to follow   Acute renal insufficiency -Baseline GFR greater than 90 with BUN 12 and creatinine 0.93-GFR has decreased slightly in patient presented with mild transaminitis and mildly elevated total bilirubin all of which is suspicious for possible volume depletion noting patient was on diuretic prior to admission-continue IV fluid    HYPERTENSION -Blood pressure soft so home medications remain on hold (HCTZ)    LOW BACK PAIN/Lumbar disc disease -MRI unchanged from previous scan from 05/13/2013-both consistent with multilevel degenerative change in the lumbar spine-continue Flexeril    ANXIETY/DEPRESSION    Morbid Obesity - Body mass index is 51.59 kg/(m^2).   DVT prophylaxis: SCDs Code Status:  FULL Family Communication: no family at bedside  Disposition Plan/Expected LOS: stepdown   Consultants: None   Procedures: None  Cultures: 8/30 urine culture no growth  8/30 cultures x2 pending   Antibiotics: Aztreonam  8/30 >>> Levaquin 8/30 >>> Vancomycin 8/30 >>>  Objective: Blood pressure 127/52, pulse 74, temperature 98.1 F (36.7 C), temperature source Oral, resp. rate 22, height 5\' 5"  (1.651 m), weight 310 lb (140.615 kg), SpO2 97.00%.  Intake/Output Summary (Last 24 hours) at 08/20/14 1301 Last data filed at 08/20/14 0247  Gross per 24 hour  Intake      0 ml  Output    427 ml  Net   -  427 ml   Exam: Gen: No acute respiratory distress Chest: Clear to auscultation bilaterally without wheezes, rhonchi or crackles, room air Cardiac: Regular rate and rhythm, S1-S2, no rubs murmurs or gallops, no peripheral edema, no  JVD Abdomen: Soft nontender nondistended without obvious hepatosplenomegaly, no ascites Extremities: Symmetrical in appearance without cyanosis, clubbing or effusion  Scheduled Meds:  Scheduled Meds: . antiseptic oral rinse  7 mL Mouth Rinse BID  . aztreonam  2 g Intravenous Q8H  . feeding supplement (ENSURE COMPLETE)  237 mL Oral Q1500  . hydrochlorothiazide  25 mg Oral Q breakfast  . levofloxacin (LEVAQUIN) IV  750 mg Intravenous Q24H  . sodium chloride  3 mL Intravenous Q12H  . vancomycin  1,500 mg Intravenous Q12H   Data Reviewed: Basic Metabolic Panel:  Recent Labs Lab 08/19/14 0804 08/19/14 1728 08/20/14 0523  NA 140 141 141  K 4.1 4.7 3.8  CL 100 103 108  CO2 28 24 23   GLUCOSE 130* 94 101*  BUN 12 12 13   CREATININE 0.93 0.89 0.91  CALCIUM 9.5 7.7* 7.6*  MG  --  1.7  --   PHOS  --  3.3  --    Liver Function Tests:  Recent Labs Lab 08/19/14 0804 08/19/14 1728 08/20/14 0523  AST 25 38* 32  ALT 33 39* 36*  ALKPHOS 73 65 62  BILITOT 0.8 1.3* 1.0  PROT 7.6 6.5 5.8*  ALBUMIN 4.0 3.5 2.8*   CBC:  Recent Labs Lab 08/19/14 0804 08/19/14 1728 08/20/14 0523  WBC 15.7* 15.5* 9.4  NEUTROABS 12.6* 13.2*  --   HGB 14.0 12.9 11.5*  HCT 41.0 37.8 35.0*  MCV 92.8 93.6 95.1  PLT 294 228 212   CBG:  Recent Labs Lab 08/20/14 0823  GLUCAP 89    Recent Results (from the past 240 hour(s))  URINE CULTURE     Status: None   Collection Time    08/19/14  7:38 AM      Result Value Ref Range Status   Specimen Description URINE, CATHETERIZED   Final   Special Requests Normal   Final   Culture  Setup Time     Final   Value: 08/19/2014 15:09     Performed at [place]     Final   Value: NO GROWTH     Performed at [facility]   Culture     Final   Value: NO GROWTH     Performed at [facility]   Report Status 08/20/2014 FINAL   Final  CULTURE, BLOOD (ROUTINE X 2)     Status: None   Collection Time    08/19/14  8:04  AM      Result Value Ref Range Status   Specimen Description BLOOD RIGHT ARM   Final   Special Requests BOTTLES DRAWN AEROBIC AND ANAEROBIC 5CC   Final   Culture  Setup Time     Final   Value: 08/19/2014 14:35     Performed at [facility]   Culture     Final   Value:        BLOOD CULTURE RECEIVED NO GROWTH TO DATE CULTURE WILL BE HELD FOR 5 DAYS BEFORE ISSUING A FINAL NEGATIVE REPORT     Performed at [facility]   Report Status PENDING   Incomplete  CULTURE, BLOOD (ROUTINE X 2)     Status: None   Collection Time    08/19/14  8:04  AM      Result Value Ref Range Status   Specimen Description BLOOD LEFT ARM   Final   Special Requests BOTTLES DRAWN AEROBIC AND ANAEROBIC 5CC   Final   Culture  Setup Time     Final   Value: 08/19/2014 14:35     Performed at [facility]   Culture     Final   Value:        BLOOD CULTURE RECEIVED NO GROWTH TO DATE CULTURE WILL BE HELD FOR 5 DAYS BEFORE ISSUING A FINAL NEGATIVE REPORT     Performed at [facility]   Report Status PENDING   Incomplete  MRSA PCR SCREENING     Status: None   Collection Time    08/19/14  2:24 PM      Result Value Ref Range Status   MRSA by PCR NEGATIVE  NEGATIVE Final   Comment:            The GeneXpert MRSA Assay (FDA     approved for NASAL specimens     only), is one component of a     comprehensive MRSA colonization     surveillance program. It is not     intended to diagnose MRSA     infection nor to guide or     monitor treatment for     MRSA infections.     Studies:  Recent x-ray studies have been reviewed in detail by the Attending Physician  Time spent : [place], ANP [facility] Office  [phone] Pager [phone]   **If unable to reach the above provider after paging please contact the [place] @ [phone]  On-Call/Text Page:      [email or web address]      password [email or web address]  If 7PM-7AM, please contact night-coverage [email or web address] Password  [email or web address] 08/20/2014, 1:01 PM   LOS: 1 day   I have personally examined this patient and reviewed the entire database. I have reviewed the above note, made any necessary editorial changes, and agree with its content.  Cherene Altes, MD [facility]

## 2014-08-20 NOTE — Evaluation (Signed)
Occupational Therapy Evaluation Patient Details Name: Maria [name] MRN: [id number] DOB: 1956/10/17 Today's Date: 08/20/2014    History of Present Illness This 58 y.o female admitted with temp and decreased BP, as well as increased back pain.  Diagnosis sepsis due to PNA.  MRI of lumbar spine showed Multilevel degenerative change the lumbar spine. Little change from the MRI of 05/13/2013.  PMH includes: chronic back pain; morbid obesity; anxiety/depression   Clinical Impression   Pt admitted with above. She demonstrates the below listed deficits and will benefit from continued OT to maximize safety and independence with BADLs.  Pt presents to OT with generalized weakness and back pain.  I anticipate she is close to her baseline level of functioning.  Currently, she is supervision, but she fatigues rapidly with activities and becomes unsteady.  She reports she has struggled with showering at home.  Feel she will benefit from OT for energy conservation, DME instruction, HEP for strengthening.         Follow Up Recommendations  No OT follow up;Supervision - Intermittent    Equipment Recommendations  Tub/shower bench    Recommendations for Other Services       Precautions / Restrictions Precautions Precautions: Fall      Mobility Bed Mobility Overal bed mobility: Modified Independent                Transfers Overall transfer level: Modified independent Equipment used: Straight cane                  Balance                                            ADL Overall ADL's : Needs assistance/impaired Eating/Feeding: Independent   Grooming: Wash/dry hands;Supervision/safety;Standing   Upper Body Bathing: Set up;Supervision/ safety;Sitting   Lower Body Bathing: Supervison/ safety;Sit to/from stand   Upper Body Dressing : Set up;Sitting   Lower Body Dressing: Supervision/safety;Sit to/from stand   Toilet Transfer:  Supervision/safety;Ambulation;Comfort height toilet   Toileting- Clothing Manipulation and Hygiene: Supervision/safety;Sit to/from stand       Functional mobility during ADLs: Min guard;Cane General ADL Comments: as pt fatigues she becomes unsteady.  Activity is limited by pain and fatigue     Vision                     Perception     Praxis      Pertinent Vitals/Pain Pain Assessment: Faces Faces Pain Scale: Hurts whole lot Pain Location: back pain  Pain Descriptors / Indicators: Aching Pain Intervention(s): Limited activity within patient's tolerance;Monitored during session;Repositioned;Patient requesting pain meds-RN notified     Hand Dominance Right   Extremity/Trunk Assessment Upper Extremity Assessment Upper Extremity Assessment: Defer to OT evaluation   Lower Extremity Assessment Lower Extremity Assessment: Defer to PT evaluation   Cervical / Trunk Assessment Cervical / Trunk Assessment: Normal   Communication Communication Communication: No difficulties   Cognition Arousal/Alertness: Awake/alert Behavior During Therapy: Anxious Overall Cognitive Status: Within Functional Limits for tasks assessed                     General Comments       Exercises       Shoulder Instructions      Home Living Family/patient expects to be discharged to:: Private residence Living Arrangements: Alone Available Help at Discharge: Family;Available  PRN/intermittently Type of Home: Apartment Home Access: Stairs to enter Entrance Stairs-Number of Steps: 4.  Pt reports them as long steps that are slate and difficult to walk on  Entrance Stairs-Rails: Right;Left Home Layout: One level     Bathroom Shower/Tub: Tub/shower unit;Curtain Shower/tub characteristics: Architectural technologist: Standard     Home Equipment: Cane - single point          Prior Functioning/Environment Level of Independence: Needs assistance  Gait / Transfers Assistance  Needed: Pt reports she ambulates short distances with SPC, but fatigues and becomes unsteady causing her to feel fearful of possible fall.  She denies any falls.  She uses electric cart in grocery store ADL's / Homemaking Assistance Needed: Pt reports she is modified independent with all BADLs, but takes very quick showers due to difficulty standing.  Her daughter assists her with meals, cleaning, and laundry         OT Diagnosis: Generalized weakness;Acute pain   OT Problem List: Decreased strength;Decreased activity tolerance;Impaired balance (sitting and/or standing);Decreased safety awareness;Decreased knowledge of use of DME or AE;Obesity;Pain   OT Treatment/Interventions: Self-care/ADL training;DME and/or AE instruction;Therapeutic activities;Therapeutic exercise;Patient/family education;Balance training    OT Goals(Current goals can be found in the care plan section) Acute Rehab OT Goals Patient Stated Goal: to get better OT Goal Formulation: With patient Time For Goal Achievement: 09/03/14 Potential to Achieve Goals: Good ADL Goals Pt Will Perform Tub/Shower Transfer: Tub transfer;with supervision;tub bench;rolling walker Pt/caregiver will Perform Home Exercise Program: Increased strength;Both right and left upper extremity;With theraband;With written HEP provided Additional ADL Goal #1: Pt will be independent with energy conservation techniques  OT Frequency: Min 2X/week   Barriers to D/C: Decreased caregiver support          Co-evaluation              End of Session Nurse Communication: Patient requests pain meds  Activity Tolerance: Patient limited by pain Patient left: in bed;with call bell/phone within reach   Time: 5176-1607 OT Time Calculation (min): 35 min Charges:  OT General Charges $OT Visit: 1 Procedure OT Evaluation $Initial OT Evaluation Tier I: 1 Procedure OT Treatments $Self Care/Home Management : 8-22 mins $Therapeutic Activity: 8-22  mins G-Codes:    Parish Augustine M 23-Aug-2014, 4:42 PM

## 2014-08-20 NOTE — Progress Notes (Signed)
INITIAL NUTRITION ASSESSMENT  DOCUMENTATION CODES Per approved criteria  -Morbid Obesity   INTERVENTION: Ensure Complete po daily, each supplement provides 350 kcal and 13 grams of protein RD to follow for nutrition care plan  NUTRITION DIAGNOSIS: Inadequate oral intake related to decreased appetite as evidenced by patient report  Goal: Pt to meet >/= 90% of their estimated nutrition needs   Monitor:  PO & supplemental intake, weight, labs, I/O's  Reason for Assessment: Malnutrition Screening Tool Report  58 y.o. female  Admitting Dx: Sepsis  ASSESSMENT: 58 year old Female with PMH of chronic back pain, thoracic and lumbosacral radiculitis and neuritis, morbid obesity, anxiety and depression who presented to [facility] ED 08/19/2014 with complaints of fevers, chills and back pain that radiated to lower extremities and causing weakness; request was made for transfer to [facility].   Patient reports her appetite has been decreased; ate minimally at breakfast this AM; no reported unintentional weight loss; no % PO intake available per flowsheet records; would like chocolate Ensure Complete daily during hospitalization; RD to order.  No muscle or subcutaneous fat depletion noticed.  Height: Ht Readings from Last 1 Encounters:  08/19/14 5\' 5"  (1.651 m)    Weight: Wt Readings from Last 1 Encounters:  08/19/14 310 lb (140.615 kg)    Ideal Body Weight: 125 lb  % Ideal Body Weight: 248%  Wt Readings from Last 10 Encounters:  08/19/14 310 lb (140.615 kg)  08/31/13 327 lb (148.326 kg)  08/17/13 331 lb 12.7 oz (150.5 kg)  08/15/13 328 lb 4 oz (148.893 kg)  06/13/13 325 lb 8 oz (147.646 kg)  06/07/13 319 lb 1.6 oz (144.743 kg)  06/02/13 323 lb (146.512 kg)  04/05/13 327 lb 12 oz (148.666 kg)  11/22/12 334 lb (151.501 kg)  11/10/12 337 lb 8.4 oz (153.1 kg)    Usual Body Weight: 327 lb  % Usual Body Weight: 95%  BMI:  Body mass index is 51.59 kg/(m^2).  Estimated Nutritional  Needs: Kcal: 1800-2000 Protein: 90-100 gm Fluid: 1.8-2.0 L  Skin: Intact  Diet Order: General  EDUCATION NEEDS: -No education needs identified at this time   Intake/Output Summary (Last 24 hours) at 08/20/14 1145 Last data filed at 08/20/14 0247  Gross per 24 hour  Intake      0 ml  Output    427 ml  Net   -427 ml    Labs:   Recent Labs Lab 08/19/14 0804 08/19/14 1728 08/20/14 0523  NA 140 141 141  K 4.1 4.7 3.8  CL 100 103 108  CO2 28 24 23   BUN 12 12 13   CREATININE 0.93 0.89 0.91  CALCIUM 9.5 7.7* 7.6*  MG  --  1.7  --   PHOS  --  3.3  --   GLUCOSE 130* 94 101*    CBG (last 3)   Recent Labs  08/20/14 0823  GLUCAP 89    Scheduled Meds: . antiseptic oral rinse  7 mL Mouth Rinse BID  . aztreonam  2 g Intravenous Q8H  . hydrochlorothiazide  25 mg Oral Q breakfast  . levofloxacin (LEVAQUIN) IV  750 mg Intravenous Q24H  . sodium chloride  3 mL Intravenous Q12H  . vancomycin  1,500 mg Intravenous Q12H    Continuous Infusions: . sodium chloride 1,000 mL (08/20/14 0243)    Past Medical History  Diagnosis Date  . Acute lymphadenitis 08/13/2009  . Acute pharyngitis 02/10/2010  . ALLERGIC RHINITIS 08/10/2007  . ANXIETY 12/06/2007  . BACK  PAIN 12/06/2007  . CHEST PAIN 08/13/2009  . DEPRESSION 12/06/2007  . DIVERTICULOSIS, [place] 12/06/2007  . GERD 12/06/2007  . HYPERTENSION 12/06/2007  . LOW BACK PAIN 12/06/2007  . MRSA 02/07/2009  . Pain in joint, multiple sites 04/21/2010  . PARONYCHIA, FINGER 08/13/2009  . RASH-NONVESICULAR 05/09/2008  . SHOULDER PAIN, LEFT 05/09/2008  . SINUSITIS- ACUTE-NOS 05/09/2008  . THORACIC/LUMBOSACRAL NEURITIS/RADICULITIS UNSPEC 12/28/2008  . Lumbar disc disease 03/09/2012  . Cervical disc disease 03/09/2012  . Chronic pain 03/09/2012  . Impaired glucose tolerance 03/13/2012  . Cellulitis   . Atherosclerotic peripheral vascular disease 06/13/2013    Aorta on CT June 2014    Past Surgical History  Procedure Laterality Date  .  Abdominal hysterectomy    . S/p ovary cyst    . S/p right knee arthroscopy      Dr. Mardelle Matte ortho  . Cervical disc surgery      Arthur Holms, RD, LDN Pager #: [phone] After-Hours Pager #: [phone]

## 2014-08-21 DIAGNOSIS — J9600 Acute respiratory failure, unspecified whether with hypoxia or hypercapnia: Secondary | ICD-10-CM

## 2014-08-21 DIAGNOSIS — J189 Pneumonia, unspecified organism: Secondary | ICD-10-CM

## 2014-08-21 DIAGNOSIS — G8929 Other chronic pain: Secondary | ICD-10-CM

## 2014-08-21 DIAGNOSIS — M5090 Cervical disc disorder, unspecified, unspecified cervical region: Secondary | ICD-10-CM

## 2014-08-21 DIAGNOSIS — F411 Generalized anxiety disorder: Secondary | ICD-10-CM

## 2014-08-21 DIAGNOSIS — A419 Sepsis, unspecified organism: Principal | ICD-10-CM

## 2014-08-21 DIAGNOSIS — F3289 Other specified depressive episodes: Secondary | ICD-10-CM

## 2014-08-21 DIAGNOSIS — F329 Major depressive disorder, single episode, unspecified: Secondary | ICD-10-CM

## 2014-08-21 HISTORY — DX: Pneumonia, unspecified organism: J18.9

## 2014-08-21 LAB — COMPREHENSIVE METABOLIC PANEL
ALBUMIN: 3.4 g/dL — AB (ref 3.5–5.2)
ALK PHOS: 57 U/L (ref 39–117)
ALT: 45 U/L — ABNORMAL HIGH (ref 0–35)
ANION GAP: 12 (ref 5–15)
AST: 36 U/L (ref 0–37)
BUN: 9 mg/dL (ref 6–23)
CALCIUM: 8.4 mg/dL (ref 8.4–10.5)
CO2: 27 mEq/L (ref 19–32)
CREATININE: 0.8 mg/dL (ref 0.50–1.10)
Chloride: 102 mEq/L (ref 96–112)
GFR calc non Af Amer: 80 mL/min — ABNORMAL LOW (ref >90)
GLUCOSE: 102 mg/dL — AB (ref 70–99)
POTASSIUM: 4.5 meq/L (ref 3.7–5.3)
Sodium: 141 mEq/L (ref 137–147)
TOTAL PROTEIN: 7.1 g/dL (ref 6.0–8.3)
Total Bilirubin: 0.7 mg/dL (ref 0.3–1.2)

## 2014-08-21 LAB — GLUCOSE, CAPILLARY: GLUCOSE-CAPILLARY: 130 mg/dL — AB (ref 70–99)

## 2014-08-21 LAB — CBC
HEMATOCRIT: 38.8 % (ref 36.0–46.0)
Hemoglobin: 13.2 g/dL (ref 12.0–15.0)
MCH: 31.1 pg (ref 26.0–34.0)
MCHC: 34 g/dL (ref 30.0–36.0)
MCV: 91.3 fL (ref 78.0–100.0)
Platelets: 231 10*3/uL (ref 150–400)
RBC: 4.25 MIL/uL (ref 3.87–5.11)
RDW: 12.4 % (ref 11.5–15.5)
WBC: 9.2 10*3/uL (ref 4.0–10.5)

## 2014-08-21 LAB — PROCALCITONIN: PROCALCITONIN: 0.81 ng/mL

## 2014-08-21 LAB — URINE CULTURE
COLONY COUNT: NO GROWTH
CULTURE: NO GROWTH

## 2014-08-21 MED ORDER — IPRATROPIUM BROMIDE 0.02 % IN SOLN
0.5000 mg | RESPIRATORY_TRACT | Status: DC | PRN
  Administered 2014-08-21 – 2014-08-22 (×3): 0.5 mg via RESPIRATORY_TRACT
  Filled 2014-08-21 (×3): qty 2.5

## 2014-08-21 MED ORDER — ADJUST BATH/SHOWER SEAT MISC: 1.0000 | Status: DC | PRN

## 2014-08-21 MED ORDER — LEVOFLOXACIN 750 MG PO TABS
750.0000 mg | ORAL_TABLET | Freq: Every day | ORAL | Status: DC
  Administered 2014-08-21 – 2014-08-22 (×2): 750 mg via ORAL
  Filled 2014-08-21 (×3): qty 1

## 2014-08-21 NOTE — Progress Notes (Signed)
[facility]  Maria [name] PJA:[id number] DOB: 1956-03-26 DOA: 08/19/2014 PCP: Cathlean Cower, MD  Brief narrative:  58 year old WF PMHx chronic back pain, primarily thoracic and lumbar sacral radiculitis and neuritis. She also has morbid obesity weighing 310 pounds, as well as anxiety and depression. She initially presented to the [facility] emergency department on 8/30 with complaints of fever chills and back pain that radiated to her lower shin it is in cause weakness. She rated the pain at 10/10. No relief with the usual analgesics at home. No falls or trauma. She has had some intermittent cough but no shortness of breath.   In the emergency department she initially was hemodynamically stable except for a temperature of 102.3. But later her blood pressure dropped to 100/52 and there was concern for sepsis the patient was started on broad-spectrum antibiotics. Because of her presentation with back pain there was concern for possible spinal infection so an MRI of the lumbar spine was ordered but due to the patient's obesity she was unable to fit into the MRI scanner [facility] that she would be able to fit into the scanner at this facility so she was subsequently transferred to [facility]. In the interim gentleman a chest x-ray which questioned a possible mild patch of pasty in the right lung base concerning for possible pneumonia.   Since admission patient has continued with some back pain without any associated neurological symptoms other than subjective reports of weakness and tingling in the legs. The MRI was completed that shows no change from the previous MRI in May of 2014 i.e. there was no evidence for infection such as abscess or discitis. Her exam on the date of transfer was c/w PNA process noting focal congestion in the left base. She remains weak and has limited activity tolerance and is still requiring  oxygen.  Subjective: Endorses left posterior shoulder pain and extreme fatigue with activity  Assessment/Plan:    Sepsis -Sepsis physiology has resolved although BP remains soft-source appears to be pulmonary-continue empiric antibiotics and other supportive care-initial Procalcitonin was 1.38 which was suspicious for infection and has decreased to 0.81 since admission    Acute respiratory failure with hypoxia/? CAP vs viral syndrome -Initial Chest x-ray was borderline in regards to definitive infiltrate-patient requiring oxygen and did report some coughing prior to admission-white count has improved significantly with antibiotic-continue to follow-exam now c/w pulmonary infection -Out of bed to chair q. Shift -Flutter valve q 4 hr while awake   Acute renal insufficiency -Baseline GFR greater than 90 with BUN 12 and creatinine 0.93-GFR has decreased slightly in patient presented with mild transaminitis and mildly elevated total bilirubin all of which is suspicious for possible volume depletion noting patient was on diuretic prior to admission-continue IV fluid    HYPERTENSION -Blood pressure soft so home medications remain on hold (HCTZ)    LOW BACK PAIN/Lumbar disc disease -MRI unchanged from previous scan from 05/13/2013-both consistent with multilevel degenerative change in the lumbar spine-continue Flexeril    ANXIETY/DEPRESSION    Morbid Obesity - Body mass index is 51.36 kg/(m^2).   DVT prophylaxis: SCDs Code Status:  FULL Family Communication: no family at bedside  Disposition Plan/Expected LOS: Transfer to floor   Consultants: None   Procedures: None  Cultures: 8/30 urine culture no growth  8/30 cultures x2 pending   Antibiotics: Aztreonam  8/30 >>> Levaquin 8/30 >>> Vancomycin 8/30 >>>  Objective: Blood pressure 118/93, pulse 90, temperature 98.9 F (37.2 C), temperature source Oral, resp. rate 23, height 5\' 5"  (1.651 m), weight 308 lb 10.3 oz (140 kg),  SpO2 96.00%.  Intake/Output Summary (Last 24 hours) at 08/21/14 1142 Last data filed at 08/21/14 0900  Gross per 24 hour  Intake    610 ml  Output   2175 ml  Net  -1565 ml   Exam: Gen: No acute respiratory distress Chest: Coarse to auscultation especially in left base, 2L Cardiac: Regular rate and rhythm, S1-S2, no rubs murmurs or gallops, no peripheral edema, no JVD Abdomen: Soft nontender nondistended without obvious hepatosplenomegaly, no ascites Extremities: Symmetrical in appearance without cyanosis, clubbing or effusion  Scheduled Meds:  Scheduled Meds: . antiseptic oral rinse  7 mL Mouth Rinse BID  . feeding supplement (ENSURE COMPLETE)  237 mL Oral Q1500  . levofloxacin  750 mg Oral Daily  . sodium chloride  3 mL Intravenous Q12H  . vancomycin  1,500 mg Intravenous Q12H   Data Reviewed: Basic Metabolic Panel:  Recent Labs Lab 08/19/14 0804 08/19/14 1728 08/20/14 0523 08/21/14 0400  NA 140 141 141 141  K 4.1 4.7 3.8 4.5  CL 100 103 108 102  CO2 28 24 23 27   GLUCOSE 130* 94 101* 102*  BUN 12 12 13 9   CREATININE 0.93 0.89 0.91 0.80  CALCIUM 9.5 7.7* 7.6* 8.4  MG  --  1.7  --   --   PHOS  --  3.3  --   --    Liver Function Tests:  Recent Labs Lab 08/19/14 0804 08/19/14 1728 08/20/14 0523 08/21/14 0400  AST 25 38* 32 36  ALT 33 39* 36* 45*  ALKPHOS 73 65 62 57  BILITOT 0.8 1.3* 1.0 0.7  PROT 7.6 6.5 5.8* 7.1  ALBUMIN 4.0 3.5 2.8* 3.4*   CBC:  Recent Labs Lab 08/19/14 0804 08/19/14 1728 08/20/14 0523 08/21/14 0400  WBC 15.7* 15.5* 9.4 9.2  NEUTROABS 12.6* 13.2*  --   --   HGB 14.0 12.9 11.5* 13.2  HCT 41.0 37.8 35.0* 38.8  MCV 92.8 93.6 95.1 91.3  PLT 294 228 212 231   CBG:  Recent Labs Lab 08/20/14 0823 08/21/14 0757  GLUCAP 89 130*    Recent Results (from the past 240 hour(s))  URINE CULTURE     Status: None   Collection Time    08/19/14  7:38 AM      Result Value Ref Range Status   Specimen Description URINE, CATHETERIZED    Final   Special Requests Normal   Final   Culture  Setup Time     Final   Value: 08/19/2014 15:09     Performed at [place]     Final   Value: NO GROWTH     Performed at [facility]   Culture     Final   Value: NO GROWTH     Performed at [facility]   Report Status 08/20/2014 FINAL   Final  CULTURE, BLOOD (ROUTINE X 2)     Status: None   Collection Time    08/19/14  8:04 AM      Result Value Ref Range Status   Specimen Description BLOOD RIGHT ARM   Final   Special Requests BOTTLES DRAWN AEROBIC AND ANAEROBIC 5CC   Final   Culture  Setup Time     Final   Value: 08/19/2014 14:35  Performed at [facility]     Final   Value:        BLOOD CULTURE RECEIVED NO GROWTH TO DATE CULTURE WILL BE HELD FOR 5 DAYS BEFORE ISSUING A FINAL NEGATIVE REPORT     Performed at [facility]   Report Status PENDING   Incomplete  CULTURE, BLOOD (ROUTINE X 2)     Status: None   Collection Time    08/19/14  8:04 AM      Result Value Ref Range Status   Specimen Description BLOOD LEFT ARM   Final   Special Requests BOTTLES DRAWN AEROBIC AND ANAEROBIC 5CC   Final   Culture  Setup Time     Final   Value: 08/19/2014 14:35     Performed at [facility]   Culture     Final   Value:        BLOOD CULTURE RECEIVED NO GROWTH TO DATE CULTURE WILL BE HELD FOR 5 DAYS BEFORE ISSUING A FINAL NEGATIVE REPORT     Performed at [facility]   Report Status PENDING   Incomplete  MRSA PCR SCREENING     Status: None   Collection Time    08/19/14  2:24 PM      Result Value Ref Range Status   MRSA by PCR NEGATIVE  NEGATIVE Final   Comment:            The GeneXpert MRSA Assay (FDA     approved for NASAL specimens     only), is one component of a     comprehensive MRSA colonization     surveillance program. It is not     intended to diagnose MRSA     infection nor to guide or     monitor treatment for     MRSA infections.  URINE  CULTURE     Status: None   Collection Time    08/19/14  4:31 PM      Result Value Ref Range Status   Specimen Description URINE, RANDOM   Final   Special Requests NONE   Final   Culture  Setup Time     Final   Value: 08/20/2014 01:46     Performed at [place]     Final   Value: NO GROWTH     Performed at [facility]   Culture     Final   Value: NO GROWTH     Performed at [facility]   Report Status 08/21/2014 FINAL   Final     Studies:  Recent x-ray studies have been reviewed in detail by the Attending Physician  Time spent : 39 mins  Erin Hearing, ANP [facility] Office  [phone] Pager [phone]   **If unable to reach the above provider after paging please contact the [facility] @ [phone]  On-Call/Text Page:      [email or web address]      password [email or web address]  If 7PM-7AM, please contact night-coverage [email or web address] Password [email or web address] 08/21/2014, 11:42 AM   LOS: 2 days  Examed patient, discussed assessment and plan with ANP Ebony Hail and agree with the above plan. Patient with multiple complex medical issues> 40 minutes in direct patient care

## 2014-08-21 NOTE — Evaluation (Signed)
Physical Therapy Evaluation Patient Details Name: Maria [name] MRN: [id number] DOB: 07-27-1956 Today's Date: 08/21/2014   History of Present Illness  This 58 y.o female admitted with temp and decreased BP, as well as increased back pain.  Diagnosis sepsis due to PNA.  MRI of lumbar spine showed Multilevel degenerative change the lumbar spine. Little change from the MRI of 05/13/2013.  PMH includes: chronic back pain; morbid obesity; anxiety/depression  Clinical Impression  Pt admitted with above. Pt currently with functional limitations due to the deficits listed below (see PT Problem List). Pt will benefit from skilled PT to increase their independence and safety with mobility to allow discharge to the venue listed below. Should progress well and just need bariatric RW and HHPT.     Follow Up Recommendations Home health PT;Supervision - Intermittent    Equipment Recommendations  Rolling walker with 5" wheels (bariatric RW)    Recommendations for Other Services       Precautions / Restrictions Precautions Precautions: Fall Restrictions Weight Bearing Restrictions: No      Mobility  Bed Mobility Overal bed mobility: Modified Independent                Transfers Overall transfer level: Modified independent Equipment used: Rolling walker (2 wheeled)                Ambulation/Gait Ambulation/Gait assistance: Min guard Ambulation Distance (Feet): 85 Feet Assistive device: Rolling walker (2 wheeled) Gait Pattern/deviations: Step-through pattern;Decreased stride length   Gait velocity interpretation: Below normal speed for age/gender General Gait Details: Pt ambulated well with RW.  Felt steady with RW.    Stairs            Wheelchair Mobility    Modified Rankin (Stroke Patients Only)       Balance Overall balance assessment: Needs assistance;History of Falls         Standing balance support: Bilateral upper extremity supported;During functional  activity Standing balance-Leahy Scale: Fair Standing balance comment: can stand statically without RW but cannot withstand challenges with RW.                              Pertinent Vitals/Pain Pain Assessment: Faces Faces Pain Scale: Hurts even more Pain Location: back pain Pain Descriptors / Indicators: Aching Pain Intervention(s): Limited activity within patient's tolerance;Monitored during session;Repositioned VSS    Home Living Family/patient expects to be discharged to:: Private residence Living Arrangements: Alone Available Help at Discharge: Family;Available PRN/intermittently Type of Home: Apartment Home Access: Stairs to enter Entrance Stairs-Rails: Right;Left Entrance Stairs-Number of Steps: 4.  Pt reports them as long steps that are slate and difficult to walk on  Home Layout: One level Home Equipment: Cane - single point      Prior Function Level of Independence: Needs assistance   Gait / Transfers Assistance Needed: Pt reports she ambulates short distances with SPC, but fatigues and becomes unsteady causing her to feel fearful of possible fall.  She denies any falls.  She uses electric cart in grocery store  ADL's / Homemaking Assistance Needed: Pt reports she is modified independent with all BADLs, but takes very quick showers due to difficulty standing.  Her daughter assists her with meals, cleaning, and laundry         Hand Dominance   Dominant Hand: Right    Extremity/Trunk Assessment   Upper Extremity Assessment: Defer to OT evaluation  Lower Extremity Assessment: Overall WFL for tasks assessed      Cervical / Trunk Assessment: Normal  Communication   Communication: No difficulties  Cognition Arousal/Alertness: Awake/alert Behavior During Therapy: Anxious Overall Cognitive Status: Within Functional Limits for tasks assessed                      General Comments      Exercises General Exercises - Lower  Extremity Ankle Circles/Pumps: AROM;Both;10 reps;Supine Long Arc Quad: AROM;Both;10 reps;Seated      Assessment/Plan    PT Assessment Patient needs continued PT services  PT Diagnosis Generalized weakness   PT Problem List Decreased balance;Decreased strength;Decreased activity tolerance;Decreased mobility;Decreased knowledge of use of DME;Decreased safety awareness;Decreased knowledge of precautions;Pain  PT Treatment Interventions DME instruction;Gait training;Functional mobility training;Therapeutic activities;Therapeutic exercise;Balance training;Patient/family education   PT Goals (Current goals can be found in the Care Plan section) Acute Rehab PT Goals Patient Stated Goal: to get better PT Goal Formulation: With patient Time For Goal Achievement: 08/28/14 Potential to Achieve Goals: Good    Frequency Min 3X/week   Barriers to discharge        Co-evaluation               End of Session Equipment Utilized During Treatment: Gait belt Activity Tolerance: Patient limited by fatigue Patient left: in chair;with call bell/phone within reach;with nursing/sitter in room Nurse Communication: Mobility status         Time: 0626-9485 PT Time Calculation (min): 27 min   Charges:   PT Evaluation $Initial PT Evaluation Tier I: 1 Procedure PT Treatments $Gait Training: 8-22 mins   PT G Codes:          INGOLD,Giulian Goldring 2014/08/24, 12:37 PM [facility] [phone] (pager)

## 2014-08-22 LAB — CBC
HCT: 33.9 % — ABNORMAL LOW (ref 36.0–46.0)
Hemoglobin: 11.4 g/dL — ABNORMAL LOW (ref 12.0–15.0)
MCH: 31.6 pg (ref 26.0–34.0)
MCHC: 33.6 g/dL (ref 30.0–36.0)
MCV: 93.9 fL (ref 78.0–100.0)
PLATELETS: 219 10*3/uL (ref 150–400)
RBC: 3.61 MIL/uL — AB (ref 3.87–5.11)
RDW: 12.4 % (ref 11.5–15.5)
WBC: 9.3 10*3/uL (ref 4.0–10.5)

## 2014-08-22 LAB — BASIC METABOLIC PANEL
ANION GAP: 10 (ref 5–15)
BUN: 10 mg/dL (ref 6–23)
CO2: 26 mEq/L (ref 19–32)
CREATININE: 0.78 mg/dL (ref 0.50–1.10)
Calcium: 7.9 mg/dL — ABNORMAL LOW (ref 8.4–10.5)
Chloride: 104 mEq/L (ref 96–112)
GFR calc non Af Amer: >90 mL/min (ref >90)
Glucose, Bld: 120 mg/dL — ABNORMAL HIGH (ref 70–99)
POTASSIUM: 3.8 meq/L (ref 3.7–5.3)
Sodium: 140 mEq/L (ref 137–147)

## 2014-08-22 LAB — GLUCOSE, CAPILLARY: Glucose-Capillary: 146 mg/dL — ABNORMAL HIGH (ref 70–99)

## 2014-08-22 MED ORDER — HEPARIN SODIUM (PORCINE) 5000 UNIT/ML IJ SOLN
5000.0000 [IU] | Freq: Three times a day (TID) | INTRAMUSCULAR | Status: DC
  Administered 2014-08-22 – 2014-08-24 (×7): 5000 [IU] via SUBCUTANEOUS
  Filled 2014-08-22 (×6): qty 1

## 2014-08-22 MED ORDER — HYDROCHLOROTHIAZIDE 25 MG PO TABS
25.0000 mg | ORAL_TABLET | Freq: Every day | ORAL | Status: DC
  Administered 2014-08-22 – 2014-08-24 (×3): 25 mg via ORAL
  Filled 2014-08-22 (×3): qty 1

## 2014-08-22 NOTE — Progress Notes (Signed)
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 She also has morbid obesity weighing 310 pounds, as well as anxiety and depression. She initially presented to the [facility] emergency department on 8/30 with complaints of fever chills and back pain that radiated to her lower shin it is in cause weakness. She rated the pain at 10/10. No relief with the usual analgesics at home. No falls or trauma. She has had some intermittent cough but no shortness of breath.   In the emergency department she initially was hemodynamically stable except for a temperature of 102.3. But later her blood pressure dropped to 100/52 and there was concern for sepsis the patient was started on broad-spectrum antibiotics. Because of her presentation with back pain there was concern for possible spinal infection so an MRI of the lumbar spine was ordered but due to the patient's obesity she was unable to fit into the MRI scanner at [facility] reported though that she would be able to fit into the scanner at this facility so she was subsequently transferred to [facility]. In the interim gentleman a chest x-ray which questioned a possible mild patch of pasty in the right lung base concerning for possible pneumonia.   Since admission patient has continued with some back pain without any associated neurological symptoms other than subjective reports of weakness and tingling in the legs. The MRI was completed that shows no change from the previous MRI in May of 2014 i.e. there was no evidence for infection such as abscess or discitis. Her exam on the date of transfer was c/w PNA process noting focal congestion in the left base. She remains weak and has limited activity tolerance and is still requiring oxygen.  Subjective: Complains of  generalized weakness.  Assessment/Plan:    Sepsis -Sepsis physiology has resolved, other supportive care-initial Procalcitonin was 1.38 which was suspicious for infection and has decreased to 0.81 since admission    Acute respiratory failure with hypoxia -Related to community-acquired pneumonia, still requiring oxygen 2 L per minute via nasal cannula, will try to wean her off oxygen, no known history of COPD, but patient was a smoker for more than 20 years . -Out of bed to chair q. Shift -Flutter valve q 4 hr while awake   CAP - Continue with IV vancomycin and by mouth levofloxacin, a febrile, has no leukocytosis.   Acute renal insufficiency -Baseline GFR greater than 90 with BUN 12 and creatinine 0.93-GFR has decreased slightly in patient presented with mild transaminitis and mildly elevated total bilirubin all of which is suspicious for possible volume depletion noting patient was on diuretic prior to admission-continue IV fluid    HYPERTENSION -Starting to increase, so will resume back on hydrochlorothiazide.    LOW BACK PAIN/Lumbar disc disease -MRI unchanged from previous scan from 05/13/2013-both consistent with multilevel degenerative change in the lumbar spine-continue Flexeril    ANXIETY/DEPRESSION    Morbid Obesity - Body mass index is 57.43 kg/(m^2).   DVT prophylaxis: SCDs Code Status:  FULL Family Communication: no family at bedside  Disposition Plan/Expected LOS: Transfer to floor   Consultants: None   Procedures: None  Cultures: 8/30 urine culture no growth  8/30 cultures x2 pending   Antibiotics: Aztreonam  8/30- 9/1 Levaquin 8/30 >>> Vancomycin 8/30 >>>  Objective: Blood pressure 148/69, pulse 97,  temperature 99.7 F (37.6 C), temperature source Oral, resp. rate 20, height 5\' 5"  (1.651 m), weight 156.536 kg (345 lb 1.6 oz), SpO2 93.00%.  Intake/Output Summary (Last 24 hours) at 08/22/14 0955 Last data filed at 08/21/14 1700  Gross per 24 hour    Intake    240 ml  Output    350 ml  Net   -110 ml   Exam: Gen: No acute respiratory distress Chest: Coarse to auscultation especially in left base, 2L Cardiac: Regular rate and rhythm, S1-S2, no rubs murmurs or gallops, no peripheral edema, no JVD Abdomen: Soft nontender nondistended without obvious hepatosplenomegaly, no ascites Extremities: Symmetrical in appearance without cyanosis, clubbing or effusion  Scheduled Meds:  Scheduled Meds: . antiseptic oral rinse  7 mL Mouth Rinse BID  . feeding supplement (ENSURE COMPLETE)  237 mL Oral Q1500  . levofloxacin  750 mg Oral Daily  . sodium chloride  3 mL Intravenous Q12H  . vancomycin  1,500 mg Intravenous Q12H   Data Reviewed: Basic Metabolic Panel:  Recent Labs Lab 08/19/14 0804 08/19/14 1728 08/20/14 0523 08/21/14 0400 08/22/14 0604  NA 140 141 141 141 140  K 4.1 4.7 3.8 4.5 3.8  CL 100 103 108 102 104  CO2 28 24 23 27 26   GLUCOSE 130* 94 101* 102* 120*  BUN 12 12 13 9 10   CREATININE 0.93 0.89 0.91 0.80 0.78  CALCIUM 9.5 7.7* 7.6* 8.4 7.9*  MG  --  1.7  --   --   --   PHOS  --  3.3  --   --   --    Liver Function Tests:  Recent Labs Lab 08/19/14 0804 08/19/14 1728 08/20/14 0523 08/21/14 0400  AST 25 38* 32 36  ALT 33 39* 36* 45*  ALKPHOS 73 65 62 57  BILITOT 0.8 1.3* 1.0 0.7  PROT 7.6 6.5 5.8* 7.1  ALBUMIN 4.0 3.5 2.8* 3.4*   CBC:  Recent Labs Lab 08/19/14 0804 08/19/14 1728 08/20/14 0523 08/21/14 0400 08/22/14 0604  WBC 15.7* 15.5* 9.4 9.2 9.3  NEUTROABS 12.6* 13.2*  --   --   --   HGB 14.0 12.9 11.5* 13.2 11.4*  HCT 41.0 37.8 35.0* 38.8 33.9*  MCV 92.8 93.6 95.1 91.3 93.9  PLT 294 228 212 231 219   CBG:  Recent Labs Lab 08/20/14 0823 08/21/14 0757 08/22/14 0850  GLUCAP 89 130* 146*    Recent Results (from the past 240 hour(s))  URINE CULTURE     Status: None   Collection Time    08/19/14  7:38 AM      Result Value Ref Range Status   Specimen Description URINE, CATHETERIZED    Final   Special Requests Normal   Final   Culture  Setup Time     Final   Value: 08/19/2014 15:09     Performed at [place]     Final   Value: NO GROWTH     Performed at [facility]   Culture     Final   Value: NO GROWTH     Performed at [facility]   Report Status 08/20/2014 FINAL   Final  CULTURE, BLOOD (ROUTINE X 2)     Status: None   Collection Time    08/19/14  8:04 AM      Result Value Ref Range Status   Specimen Description BLOOD RIGHT ARM   Final   Special Requests BOTTLES DRAWN AEROBIC  AND ANAEROBIC 5CC   Final   Culture  Setup Time     Final   Value: 08/19/2014 14:35     Performed at [facility]   Culture     Final   Value:        BLOOD CULTURE RECEIVED NO GROWTH TO DATE CULTURE WILL BE HELD FOR 5 DAYS BEFORE ISSUING A FINAL NEGATIVE REPORT     Performed at [facility]   Report Status PENDING   Incomplete  CULTURE, BLOOD (ROUTINE X 2)     Status: None   Collection Time    08/19/14  8:04 AM      Result Value Ref Range Status   Specimen Description BLOOD LEFT ARM   Final   Special Requests BOTTLES DRAWN AEROBIC AND ANAEROBIC 5CC   Final   Culture  Setup Time     Final   Value: 08/19/2014 14:35     Performed at [facility]   Culture     Final   Value:        BLOOD CULTURE RECEIVED NO GROWTH TO DATE CULTURE WILL BE HELD FOR 5 DAYS BEFORE ISSUING A FINAL NEGATIVE REPORT     Performed at [facility]   Report Status PENDING   Incomplete  MRSA PCR SCREENING     Status: None   Collection Time    08/19/14  2:24 PM      Result Value Ref Range Status   MRSA by PCR NEGATIVE  NEGATIVE Final   Comment:            The GeneXpert MRSA Assay (FDA     approved for NASAL specimens     only), is one component of a     comprehensive MRSA colonization     surveillance program. It is not     intended to diagnose MRSA     infection nor to guide or     monitor treatment for     MRSA infections.  URINE  CULTURE     Status: None   Collection Time    08/19/14  4:31 PM      Result Value Ref Range Status   Specimen Description URINE, RANDOM   Final   Special Requests NONE   Final   Culture  Setup Time     Final   Value: 08/20/2014 01:46     Performed at [place]     Final   Value: NO GROWTH     Performed at [facility]   Culture     Final   Value: NO GROWTH     Performed at [facility]   Report Status 08/21/2014 FINAL   Final       Time spent : 78 mins  Phillips Climes MD

## 2014-08-22 NOTE — Progress Notes (Signed)
SATURATION QUALIFICATIONS: (This note is used to comply with regulatory documentation for home oxygen)  Patient Saturations on Room Air at Rest = 94%  Patient Saturations on Room Air while Ambulating = 78%  Patient Saturations on 2 Liters of oxygen while Ambulating = 93%  Please briefly explain why patient needs home oxygen:

## 2014-08-22 NOTE — Progress Notes (Signed)
Occupational Therapy Treatment Patient Details Name: Maria [name] MRN: [id number] DOB: 1956/03/08 Today's Date: 08/22/2014    History of present illness This 58 y.o female admitted with temp and decreased BP, as well as increased back pain.  Diagnosis sepsis due to PNA.  MRI of lumbar spine showed Multilevel degenerative change the lumbar spine. Little change from the MRI of 05/13/2013.  PMH includes: chronic back pain; morbid obesity; anxiety/depression   OT comments  Pt with improved activity tolerance, however, 02 sats decreased to 78% on RA.  Pt was provided with incentive spirometer and instructed in its use.  She was also instructed in energy conservation tehniques. Will continue to follow.  Recommend tub transfer bench for home use   Follow Up Recommendations  No OT follow up;Supervision - Intermittent    Equipment Recommendations  Tub/shower bench    Recommendations for Other Services      Precautions / Restrictions Precautions Precautions: Fall Precaution Comments: monitor 02 sats       Mobility Bed Mobility                  Transfers Overall transfer level: Modified independent Equipment used: Rolling walker (2 wheeled)                  Balance           Standing balance support: During functional activity Standing balance-Leahy Scale: Fair                     ADL                       Lower Body Dressing: Supervision/safety;Sit to/from stand       Toileting- Water quality scientist and Hygiene: Supervision/safety;Sit to/from stand         General ADL Comments: Pt tolerated increased acitivity this date, but 02 sats decresased to 78% on RA. Pt was provided with incentive spirometer and instructed in it's use.  She performed x 10       Vision                     Perception     Praxis      Cognition   Behavior During Therapy: Anxious Overall Cognitive Status: Within Functional Limits for tasks  assessed                       Extremity/Trunk Assessment               Exercises     Shoulder Instructions       General Comments      Pertinent Vitals/ Pain       Pain Assessment: Faces Faces Pain Scale: Hurts little more Pain Location: back Pain Descriptors / Indicators: Aching Pain Intervention(s): Monitored during session  Home Living                                          Prior Functioning/Environment              Frequency Min 2X/week     Progress Toward Goals  OT Goals(current goals can now be found in the care plan section)  Progress towards OT goals: Progressing toward goals  ADL Goals Pt Will Perform Tub/Shower Transfer: Tub transfer;with supervision;tub bench;rolling walker Pt/caregiver will Perform Home Exercise Program:  Increased strength;Both right and left upper extremity;With theraband;With written HEP provided Additional ADL Goal #1: Pt will be independent with energy conservation techniques  Plan Discharge plan remains appropriate    Co-evaluation                 End of Session Equipment Utilized During Treatment: Rolling walker;Oxygen   Activity Tolerance Patient tolerated treatment well   Patient Left in chair;with call bell/phone within reach   Nurse Communication Mobility status        Time: 3704-8889 OT Time Calculation (min): 29 min  Charges: OT General Charges $OT Visit: 1 Procedure OT Treatments $Self Care/Home Management : 8-22 mins $Therapeutic Activity: 8-22 mins  Renee Beale M 08/22/2014, 2:01 PM

## 2014-08-22 NOTE — Progress Notes (Signed)
ANTIBIOTIC CONSULT NOTE - FOLLOW UP  Pharmacy Consult for Vancomycin + Cefepime Indication: rule out pneumonia  Allergies  Allergen Reactions  . Adhesive [Tape] Other (See Comments)    Tears skin off - use paper tape   . Amoxicillin-Pot Clavulanate Nausea And Vomiting  . Codeine Other (See Comments)    hallucinations  . Lactose Intolerance (Gi)     Unknown reaction   . Morphine And Related Nausea And Vomiting and Other (See Comments)    headaches  . Vicodin [Hydrocodone-Acetaminophen] Other (See Comments)    hallucinations  . Latex Rash    Patient Measurements: Height: 5\' 5"  (165.1 cm) Weight: 345 lb 1.6 oz (156.536 kg) IBW/kg (Calculated) : 57  Vital Signs: Temp: 99.7 F (37.6 C) (09/02 0503) Temp src: Oral (09/02 0503) BP: 148/69 mmHg (09/02 0503) Pulse Rate: 97 (09/02 0503) Intake/Output from previous day: 09/01 0701 - 09/02 0700 In: 480 [P.O.:480] Out: 600 [Urine:600] Intake/Output from this shift:    Labs:  Recent Labs  08/20/14 0523 08/21/14 0400 08/22/14 0604  WBC 9.4 9.2 9.3  HGB 11.5* 13.2 11.4*  PLT 212 231 219  CREATININE 0.91 0.80 0.78   Estimated Creatinine Clearance: 118.6 ml/min (by C-G formula based on Cr of 0.78). No results found for this basename: VANCOTROUGH, Corlis Leak, VANCORANDOM, GENTTROUGH, GENTPEAK, GENTRANDOM, TOBRATROUGH, TOBRAPEAK, TOBRARND, AMIKACINPEAK, AMIKACINTROU, AMIKACIN,  in the last 72 hours   Microbiology: Recent Results (from the past 720 hour(s))  URINE CULTURE     Status: None   Collection Time    08/19/14  7:38 AM      Result Value Ref Range Status   Specimen Description URINE, CATHETERIZED   Final   Special Requests Normal   Final   Culture  Setup Time     Final   Value: 08/19/2014 15:09     Performed at [place]     Final   Value: NO GROWTH     Performed at [facility]   Culture     Final   Value: NO GROWTH     Performed at [facility]   Report Status  08/20/2014 FINAL   Final  CULTURE, BLOOD (ROUTINE X 2)     Status: None   Collection Time    08/19/14  8:04 AM      Result Value Ref Range Status   Specimen Description BLOOD RIGHT ARM   Final   Special Requests BOTTLES DRAWN AEROBIC AND ANAEROBIC 5CC   Final   Culture  Setup Time     Final   Value: 08/19/2014 14:35     Performed at [facility]   Culture     Final   Value:        BLOOD CULTURE RECEIVED NO GROWTH TO DATE CULTURE WILL BE HELD FOR 5 DAYS BEFORE ISSUING A FINAL NEGATIVE REPORT     Performed at [facility]   Report Status PENDING   Incomplete  CULTURE, BLOOD (ROUTINE X 2)     Status: None   Collection Time    08/19/14  8:04 AM      Result Value Ref Range Status   Specimen Description BLOOD LEFT ARM   Final   Special Requests BOTTLES DRAWN AEROBIC AND ANAEROBIC 5CC   Final   Culture  Setup Time     Final   Value: 08/19/2014 14:35     Performed at [place]     Final  Value:        BLOOD CULTURE RECEIVED NO GROWTH TO DATE CULTURE WILL BE HELD FOR 5 DAYS BEFORE ISSUING A FINAL NEGATIVE REPORT     Performed at [facility]   Report Status PENDING   Incomplete  MRSA PCR SCREENING     Status: None   Collection Time    08/19/14  2:24 PM      Result Value Ref Range Status   MRSA by PCR NEGATIVE  NEGATIVE Final   Comment:            The GeneXpert MRSA Assay (FDA     approved for NASAL specimens     only), is one component of a     comprehensive MRSA colonization     surveillance program. It is not     intended to diagnose MRSA     infection nor to guide or     monitor treatment for     MRSA infections.  URINE CULTURE     Status: None   Collection Time    08/19/14  4:31 PM      Result Value Ref Range Status   Specimen Description URINE, RANDOM   Final   Special Requests NONE   Final   Culture  Setup Time     Final   Value: 08/20/2014 01:46     Performed at [place]     Final   Value: NO  GROWTH     Performed at [facility]   Culture     Final   Value: NO GROWTH     Performed at [facility]   Report Status 08/21/2014 FINAL   Final    Anti-infectives   Start     Dose/Rate Route Frequency Ordered Stop   08/21/14 1000  levofloxacin (LEVAQUIN) tablet 750 mg     750 mg Oral Daily 08/21/14 0916     08/20/14 1000  levofloxacin (LEVAQUIN) IVPB 750 mg  Status:  Discontinued     750 mg 100 mL/hr over 90 Minutes Intravenous Every 24 hours 08/19/14 0915 08/21/14 0916   08/19/14 2200  vancomycin (VANCOCIN) 1,500 mg in sodium chloride 0.9 % 500 mL IVPB     1,500 mg 250 mL/hr over 120 Minutes Intravenous Every 12 hours 08/19/14 0915     08/19/14 1600  aztreonam (AZACTAM) 2 g in dextrose 5 % 50 mL IVPB  Status:  Discontinued     2 g 100 mL/hr over 30 Minutes Intravenous Every 8 hours 08/19/14 0915 08/20/14 1938   08/19/14 1100  levofloxacin (LEVAQUIN) IVPB 750 mg  Status:  Discontinued     750 mg 100 mL/hr over 90 Minutes Intravenous Every 24 hours 08/19/14 1057 08/19/14 1115   08/19/14 0800  vancomycin (VANCOCIN) 2,500 mg in sodium chloride 0.9 % 500 mL IVPB     2,500 mg 250 mL/hr over 120 Minutes Intravenous  Once 08/19/14 0756 08/19/14 1120   08/19/14 0745  levofloxacin (LEVAQUIN) IVPB 750 mg     750 mg 100 mL/hr over 90 Minutes Intravenous  Once 08/19/14 0744 08/19/14 1049   08/19/14 0745  aztreonam (AZACTAM) 2 g in dextrose 5 % 50 mL IVPB     2 g 100 mL/hr over 30 Minutes Intravenous  Once 08/19/14 0744 08/19/14 0848   08/19/14 0745  vancomycin (VANCOCIN) IVPB 1000 mg/200 mL premix  Status:  Discontinued     1,000 mg 200 mL/hr over 60 Minutes Intravenous  Once 08/19/14 0744 08/19/14 0752      Assessment: 69 YOF who continues on Vancomycin + Levaquin for r/o PNA/sepsis. The patient continues to have low-grade fevers, Tmax/24h: 99.9, WBC wnl. MRI did not show abscess/discitis. Renal function stable - doses remain appropriate.   Vanc 8/30 >> Aztreonam  8/30 >>8/31 Levaquin 8/30 >>  8/30 UCx >> NG 8/30 BCx >> ngtd  Goal of Therapy:  Vancomycin trough level 15-20 mcg/ml  Plan:  1. Continue Vancomycin 1500 mg IV every 12 hours 2. Continue Levaquin 750 mg po every 24 hours 3. Consider de-escalation to Levaquin monotherapy to complete 8 day course of therapy (stop date of 9/7) 4. Will continue to follow renal function, culture results, LOT, and antibiotic de-escalation plans   Alycia Rossetti, PharmD, BCPS Clinical Pharmacist Pager: [phone] 08/22/2014 10:09 AM

## 2014-08-23 LAB — GLUCOSE, CAPILLARY: Glucose-Capillary: 86 mg/dL (ref 70–99)

## 2014-08-23 LAB — VANCOMYCIN, TROUGH: VANCOMYCIN TR: 14 ug/mL (ref 10.0–20.0)

## 2014-08-23 LAB — PROCALCITONIN: Procalcitonin: 0.24 ng/mL

## 2014-08-23 MED ORDER — VANCOMYCIN HCL 10 G IV SOLR
1500.0000 mg | Freq: Two times a day (BID) | INTRAVENOUS | Status: DC
  Administered 2014-08-24: 1500 mg via INTRAVENOUS
  Filled 2014-08-23 (×4): qty 1500

## 2014-08-23 MED ORDER — DEXTROSE 5 % IV SOLN
2.0000 g | Freq: Two times a day (BID) | INTRAVENOUS | Status: DC
  Administered 2014-08-23 – 2014-08-24 (×3): 2 g via INTRAVENOUS
  Filled 2014-08-23 (×4): qty 2

## 2014-08-23 MED ORDER — DEXTROSE 5 % IV SOLN
1.0000 g | Freq: Two times a day (BID) | INTRAVENOUS | Status: DC
  Filled 2014-08-23 (×2): qty 1

## 2014-08-23 NOTE — Progress Notes (Signed)
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 She also has morbid obesity weighing 310 pounds, as well as anxiety and depression. She initially presented to the [facility] emergency department on 8/30 with complaints of fever chills and back pain that radiated to her lower shin it is in cause weakness. She rated the pain at 10/10. No relief with the usual analgesics at home. No falls or trauma. She has had some intermittent cough but no shortness of breath.   In the emergency department she initially was hemodynamically stable except for a temperature of 102.3. But later her blood pressure dropped to 100/52 and there was concern for sepsis the patient was started on broad-spectrum antibiotics. Because of her presentation with back pain there was concern for possible spinal infection so an MRI of the lumbar spine was ordered but due to the patient's obesity she was unable to fit into the MRI scanner at [place] [facility] reported though that she would be able to fit into the scanner at this facility so she was subsequently transferred to [facility]. In the interim gentleman a chest x-ray which questioned a possible mild patch of pasty in the right lung base concerning for possible pneumonia.   Since admission patient has continued with some back pain without any associated neurological symptoms other than subjective reports of weakness and tingling in the legs. The MRI was completed that shows no change from the previous MRI in May of 2014 i.e. there was no evidence for infection such as abscess or discitis. Her exam on the date of transfer was c/w PNA process noting focal congestion in the left base. She remains weak and has limited activity tolerance and is still requiring oxygen.  Subjective: Complains of  generalized weakness.  Assessment/Plan:    Sepsis - Due to Pneumonia -Sepsis physiology has resolved, other supportive care-initial Procalcitonin was 1.38 which was suspicious for infection and has decreased to 0.81 since admission, today is 0.24 .   -So far No growth on blood cultures.    Acute respiratory failure with hypoxia -Related to community-acquired pneumonia, still requiring oxygen 2 L per minute via nasal cannula, will try to wean her off oxygen, no known history of COPD, but patient was a smoker for more than 20 years . -Out of bed to chair q. Shift -Flutter valve q 4 hr while awake - desaturated upon ambulation, may need home oxygen.   CAP - Continue with IV vancomycin ,has no leukocytosis, temp of 100.1 yesterday, will change levofloxacin to cefepime.   Acute renal insufficiency -Baseline GFR greater than 90 with BUN 12 and creatinine 0.93-GFR has decreased slightly in patient presented with mild transaminitis and mildly elevated total bilirubin all of which is suspicious for possible volume depletion noting patient was on diuretic prior to admission-continue IV fluid.    HYPERTENSION -Starting to increase, so will resume back on hydrochlorothiazide.    LOW BACK PAIN/Lumbar disc disease -MRI unchanged from previous scan from 05/13/2013-both consistent with multilevel degenerative change in the lumbar spine-continue Flexeril    ANXIETY/DEPRESSION    Morbid Obesity - Body mass index is 58.24 kg/(m^2).   DVT prophylaxis: SCDs, [place] heparin. Code Status:  FULL Family Communication: no family at bedside  Disposition Plan/Expected LOS: Transfer to floor   Consultants: None   Procedures: None  Cultures:  8/30 urine culture no growth  8/30 cultures x2 pending   Antibiotics: Aztreonam  8/30- 9/1 Levaquin 8/30 >>>9/2 Vancomycin 8/30 >>> Cefepime 9/3  Objective: Blood pressure 143/58, pulse 83, temperature 98.7 F (37.1 C), temperature source Oral, resp. rate  18, height 5\' 5"  (1.651 m), weight 158.759 kg (350 lb), SpO2 97.00%.  Intake/Output Summary (Last 24 hours) at 08/23/14 0943 Last data filed at 08/22/14 1800  Gross per 24 hour  Intake    480 ml  Output      0 ml  Net    480 ml   Exam: Gen: No acute respiratory distress Chest: Coarse to auscultation especially in left base, 2L Cardiac: Regular rate and rhythm, S1-S2, no rubs murmurs or gallops, no peripheral edema, no JVD Abdomen: Soft nontender nondistended without obvious hepatosplenomegaly, no ascites Extremities: Symmetrical in appearance without cyanosis, clubbing or effusion  Scheduled Meds:  Scheduled Meds: . antiseptic oral rinse  7 mL Mouth Rinse BID  . ceFEPime (MAXIPIME) IV  2 g Intravenous Q12H  . feeding supplement (ENSURE COMPLETE)  237 mL Oral Q1500  . heparin subcutaneous  5,000 Units Subcutaneous 3 times per day  . hydrochlorothiazide  25 mg Oral Daily  . sodium chloride  3 mL Intravenous Q12H  . vancomycin  1,500 mg Intravenous Q12H   Data Reviewed: Basic Metabolic Panel:  Recent Labs Lab 08/19/14 0804 08/19/14 1728 08/20/14 0523 08/21/14 0400 08/22/14 0604  NA 140 141 141 141 140  K 4.1 4.7 3.8 4.5 3.8  CL 100 103 108 102 104  CO2 28 24 23 27 26   GLUCOSE 130* 94 101* 102* 120*  BUN 12 12 13 9 10   CREATININE 0.93 0.89 0.91 0.80 0.78  CALCIUM 9.5 7.7* 7.6* 8.4 7.9*  MG  --  1.7  --   --   --   PHOS  --  3.3  --   --   --    Liver Function Tests:  Recent Labs Lab 08/19/14 0804 08/19/14 1728 08/20/14 0523 08/21/14 0400  AST 25 38* 32 36  ALT 33 39* 36* 45*  ALKPHOS 73 65 62 57  BILITOT 0.8 1.3* 1.0 0.7  PROT 7.6 6.5 5.8* 7.1  ALBUMIN 4.0 3.5 2.8* 3.4*   CBC:  Recent Labs Lab 08/19/14 0804 08/19/14 1728 08/20/14 0523 08/21/14 0400 08/22/14 0604  WBC 15.7* 15.5* 9.4 9.2 9.3  NEUTROABS 12.6* 13.2*  --   --   --   HGB 14.0 12.9 11.5* 13.2 11.4*  HCT 41.0 37.8 35.0* 38.8 33.9*  MCV 92.8 93.6 95.1 91.3 93.9  PLT 294 228 212 231 219     CBG:  Recent Labs Lab 08/20/14 0823 08/21/14 0757 08/22/14 0850 08/23/14 0750  GLUCAP 89 130* 146* 86    Recent Results (from the past 240 hour(s))  URINE CULTURE     Status: None   Collection Time    08/19/14  7:38 AM      Result Value Ref Range Status   Specimen Description URINE, CATHETERIZED   Final   Special Requests Normal   Final   Culture  Setup Time     Final   Value: 08/19/2014 15:09     Performed at [place]     Final   Value: NO GROWTH     Performed at [facility]   Culture     Final   Value: NO GROWTH     Performed at [facility]  Report Status 08/20/2014 FINAL   Final  CULTURE, BLOOD (ROUTINE X 2)     Status: None   Collection Time    08/19/14  8:04 AM      Result Value Ref Range Status   Specimen Description BLOOD RIGHT ARM   Final   Special Requests BOTTLES DRAWN AEROBIC AND ANAEROBIC 5CC   Final   Culture  Setup Time     Final   Value: 08/19/2014 14:35     Performed at [facility]   Culture     Final   Value:        BLOOD CULTURE RECEIVED NO GROWTH TO DATE CULTURE WILL BE HELD FOR 5 DAYS BEFORE ISSUING A FINAL NEGATIVE REPORT     Performed at [facility]   Report Status PENDING   Incomplete  CULTURE, BLOOD (ROUTINE X 2)     Status: None   Collection Time    08/19/14  8:04 AM      Result Value Ref Range Status   Specimen Description BLOOD LEFT ARM   Final   Special Requests BOTTLES DRAWN AEROBIC AND ANAEROBIC 5CC   Final   Culture  Setup Time     Final   Value: 08/19/2014 14:35     Performed at [facility]   Culture     Final   Value:        BLOOD CULTURE RECEIVED NO GROWTH TO DATE CULTURE WILL BE HELD FOR 5 DAYS BEFORE ISSUING A FINAL NEGATIVE REPORT     Performed at [facility]   Report Status PENDING   Incomplete  MRSA PCR SCREENING     Status: None   Collection Time    08/19/14  2:24 PM      Result Value Ref Range Status   MRSA by PCR NEGATIVE  NEGATIVE  Final   Comment:            The GeneXpert MRSA Assay (FDA     approved for NASAL specimens     only), is one component of a     comprehensive MRSA colonization     surveillance program. It is not     intended to diagnose MRSA     infection nor to guide or     monitor treatment for     MRSA infections.  URINE CULTURE     Status: None   Collection Time    08/19/14  4:31 PM      Result Value Ref Range Status   Specimen Description URINE, RANDOM   Final   Special Requests NONE   Final   Culture  Setup Time     Final   Value: 08/20/2014 01:46     Performed at [place]     Final   Value: NO GROWTH     Performed at [facility]   Culture     Final   Value: NO GROWTH     Performed at [facility]   Report Status 08/21/2014 FINAL   Final       Time spent : 13 mins  Phillips Climes MD Pager [phone]

## 2014-08-23 NOTE — Progress Notes (Signed)
Physical Therapy Treatment Patient Details Name: Maria [name] MRN: [id number] DOB: 1956/06/03 Today's Date: 08/23/2014    History of Present Illness This 58 y.o female admitted with temp and decreased BP, as well as increased back pain.  Diagnosis sepsis due to PNA.  MRI of lumbar spine showed Multilevel degenerative change the lumbar spine. Little change from the MRI of 05/13/2013.  PMH includes: chronic back pain; morbid obesity; anxiety/depression    PT Comments    Pt. Anxious and tearful today at the thought of needing to use home O2.  States "I don't want to die at home alone".  Discussed, reassured pt. In egards to home O2 use and logistics.  Pt. Did not agree to ambulate in hallway today with PT, s her lunch had just arrived.  She was positioned in recliner chair for her meal, on 2 side by side pillows and a pillow behind her back.  She was pleased with this positioning and was comfortable.  I encouraged her to take all her meals in the chair instead of bed.  She agrees.  Monitor sats for walks in the hallway.  Follow Up Recommendations  Home health PT;Supervision - Intermittent     Equipment Recommendations  Rolling walker with 5" wheels;Other (comment) (bariatric RW; also would enefit from "life alert" type devic)    Recommendations for Other Services       Precautions / Restrictions Precautions Precautions: Fall Precaution Comments: monitor 02 sats Restrictions Weight Bearing Restrictions: No    Mobility  Bed Mobility Overal bed mobility: Modified Independent             General bed mobility comments: managed without need for assist  Transfers Overall transfer level: Modified independent Equipment used: None             General transfer comment: up to stand at mod I level with no physical assist needed, no LOB noted  Ambulation/Gait Ambulation/Gait assistance: Supervision Ambulation Distance (Feet): 30 Feet Assistive device: None Gait  Pattern/deviations: Step-through pattern;Trunk flexed;Decreased stride length Gait velocity: decreased   General Gait Details: Pt. wanted to get up to bathroom then agreeable to sit in recliner chair to eat lunch.  Pt did not use RW in room and did not display overt LOB without device .     Stairs            Wheelchair Mobility    Modified Rankin (Stroke Patients Only)       Balance                                    Cognition Arousal/Alertness: Awake/alert Behavior During Therapy: Anxious Overall Cognitive Status: Within Functional Limits for tasks assessed                      Exercises      General Comments        Pertinent Vitals/Pain Pain Assessment: Faces Faces Pain Scale: Hurts little more Pain Location: low back Pain Intervention(s): Limited activity within patient's tolerance;Patient requesting pain meds-RN notified;Repositioned    Home Living                      Prior Function            PT Goals (current goals can now be found in the care plan section) Progress towards PT goals: Progressing toward goals    Frequency  Min 3X/week    PT Plan Current plan remains appropriate    Co-evaluation             End of Session Equipment Utilized During Treatment: Gait belt Activity Tolerance: Patient limited by fatigue;Other (comment) (limited by her anxiousness) Patient left: in chair;with call bell/phone within reach     Time: 1312-1327 PT Time Calculation (min): 15 min  Charges:  $Gait Training: 8-22 mins                    G Codes:      Maria [name] 08/23/2014, 1:36 PM Maria [name] PT [facility] [place] [phone]

## 2014-08-23 NOTE — Progress Notes (Signed)
ANTIBIOTIC CONSULT NOTE - FOLLOW UP  Pharmacy Consult for Vancomycin Indication: rule out pneumonia  Allergies  Allergen Reactions  . Adhesive [Tape] Other (See Comments)    Tears skin off - use paper tape   . Amoxicillin-Pot Clavulanate Nausea And Vomiting  . Codeine Other (See Comments)    hallucinations  . Lactose Intolerance (Gi)     Unknown reaction   . Morphine And Related Nausea And Vomiting and Other (See Comments)    headaches  . Vicodin [Hydrocodone-Acetaminophen] Other (See Comments)    hallucinations  . Latex Rash    Patient Measurements: Height: 5\' 5"  (165.1 cm) Weight: 350 lb (158.759 kg) IBW/kg (Calculated) : 57  Vital Signs: Temp: 98.7 F (37.1 C) (09/03 0430) Temp src: Oral (09/03 0430) BP: 143/58 mmHg (09/03 0430) Pulse Rate: 83 (09/03 0430) Intake/Output from previous day: 09/02 0701 - 09/03 0700 In: 720 [P.O.:720] Out: -  Intake/Output from this shift:    Labs:  Recent Labs  08/21/14 0400 08/22/14 0604  WBC 9.2 9.3  HGB 13.2 11.4*  PLT 231 219  CREATININE 0.80 0.78   Estimated Creatinine Clearance: 119.7 ml/min (by C-G formula based on Cr of 0.78). No results found for this basename: VANCOTROUGH, Corlis Leak, VANCORANDOM, GENTTROUGH, GENTPEAK, GENTRANDOM, TOBRATROUGH, TOBRAPEAK, TOBRARND, AMIKACINPEAK, AMIKACINTROU, AMIKACIN,  in the last 72 hours   Microbiology: Recent Results (from the past 720 hour(s))  URINE CULTURE     Status: None   Collection Time    08/19/14  7:38 AM      Result Value Ref Range Status   Specimen Description URINE, CATHETERIZED   Final   Special Requests Normal   Final   Culture  Setup Time     Final   Value: 08/19/2014 15:09     Performed at [place]     Final   Value: NO GROWTH     Performed at [facility]   Culture     Final   Value: NO GROWTH     Performed at [facility]   Report Status 08/20/2014 FINAL   Final  CULTURE, BLOOD (ROUTINE X 2)     Status: None    Collection Time    08/19/14  8:04 AM      Result Value Ref Range Status   Specimen Description BLOOD RIGHT ARM   Final   Special Requests BOTTLES DRAWN AEROBIC AND ANAEROBIC 5CC   Final   Culture  Setup Time     Final   Value: 08/19/2014 14:35     Performed at [facility]   Culture     Final   Value:        BLOOD CULTURE RECEIVED NO GROWTH TO DATE CULTURE WILL BE HELD FOR 5 DAYS BEFORE ISSUING A FINAL NEGATIVE REPORT     Performed at [facility]   Report Status PENDING   Incomplete  CULTURE, BLOOD (ROUTINE X 2)     Status: None   Collection Time    08/19/14  8:04 AM      Result Value Ref Range Status   Specimen Description BLOOD LEFT ARM   Final   Special Requests BOTTLES DRAWN AEROBIC AND ANAEROBIC 5CC   Final   Culture  Setup Time     Final   Value: 08/19/2014 14:35     Performed at [facility]   Culture     Final   Value:  BLOOD CULTURE RECEIVED NO GROWTH TO DATE CULTURE WILL BE HELD FOR 5 DAYS BEFORE ISSUING A FINAL NEGATIVE REPORT     Performed at [facility]   Report Status PENDING   Incomplete  MRSA PCR SCREENING     Status: None   Collection Time    08/19/14  2:24 PM      Result Value Ref Range Status   MRSA by PCR NEGATIVE  NEGATIVE Final   Comment:            The GeneXpert MRSA Assay (FDA     approved for NASAL specimens     only), is one component of a     comprehensive MRSA colonization     surveillance program. It is not     intended to diagnose MRSA     infection nor to guide or     monitor treatment for     MRSA infections.  URINE CULTURE     Status: None   Collection Time    08/19/14  4:31 PM      Result Value Ref Range Status   Specimen Description URINE, RANDOM   Final   Special Requests NONE   Final   Culture  Setup Time     Final   Value: 08/20/2014 01:46     Performed at [facility]     Final   Value: NO GROWTH     Performed at [facility]   Culture     Final    Value: NO GROWTH     Performed at [facility]   Report Status 08/21/2014 FINAL   Final    Anti-infectives   Start     Dose/Rate Route Frequency Ordered Stop   08/23/14 1000  ceFEPIme (MAXIPIME) 2 g in dextrose 5 % 50 mL IVPB     2 g 100 mL/hr over 30 Minutes Intravenous Every 12 hours 08/23/14 0848     08/23/14 0800  ceFEPIme (MAXIPIME) 1 g in dextrose 5 % 50 mL IVPB  Status:  Discontinued     1 g 100 mL/hr over 30 Minutes Intravenous Every 12 hours 08/23/14 0659 08/23/14 0848   08/21/14 1000  levofloxacin (LEVAQUIN) tablet 750 mg  Status:  Discontinued     750 mg Oral Daily 08/21/14 0916 08/23/14 0659   08/20/14 1000  levofloxacin (LEVAQUIN) IVPB 750 mg  Status:  Discontinued     750 mg 100 mL/hr over 90 Minutes Intravenous Every 24 hours 08/19/14 0915 08/21/14 0916   08/19/14 2200  vancomycin (VANCOCIN) 1,500 mg in sodium chloride 0.9 % 500 mL IVPB     1,500 mg 250 mL/hr over 120 Minutes Intravenous Every 12 hours 08/19/14 0915     08/19/14 1600  aztreonam (AZACTAM) 2 g in dextrose 5 % 50 mL IVPB  Status:  Discontinued     2 g 100 mL/hr over 30 Minutes Intravenous Every 8 hours 08/19/14 0915 08/20/14 1938   08/19/14 1100  levofloxacin (LEVAQUIN) IVPB 750 mg  Status:  Discontinued     750 mg 100 mL/hr over 90 Minutes Intravenous Every 24 hours 08/19/14 1057 08/19/14 1115   08/19/14 0800  vancomycin (VANCOCIN) 2,500 mg in sodium chloride 0.9 % 500 mL IVPB     2,500 mg 250 mL/hr over 120 Minutes Intravenous  Once 08/19/14 0756 08/19/14 1120   08/19/14 0745  levofloxacin (LEVAQUIN) IVPB 750 mg     750 mg 100 mL/hr over 90 Minutes  Intravenous  Once 08/19/14 0744 08/19/14 1049   08/19/14 0745  aztreonam (AZACTAM) 2 g in dextrose 5 % 50 mL IVPB     2 g 100 mL/hr over 30 Minutes Intravenous  Once 08/19/14 0744 08/19/14 0848   08/19/14 0745  vancomycin (VANCOCIN) IVPB 1000 mg/200 mL premix  Status:  Discontinued     1,000 mg 200 mL/hr over 60 Minutes Intravenous  Once 08/19/14  0744 08/19/14 0752      Assessment: 13 YOF who continues on Vancomycin and transitioned to Cefepime this morning total abx D#6 for r/o PNA/sepsis. The patient continues to have fevers, Tmax/24h: 100.1, WBC wnl. MRI did not show abscess/discitis. A Vancomycin trough this morning resulted as 14 mcg/ml however was drawn ~1 hour late, true trough likely 15 mcg/ml. Renal function stable - will keep at current dose for now  Vanc 8/30 >> Aztreonam 8/30 >>8/31 Levaquin 8/30 >>  8/30 UCx >> NG 8/30 BCx >> ngtd  Goal of Therapy:  Vancomycin trough level 15-20 mcg/ml  Plan:  1. Continue Vancomycin 1500 mg IV every 12 hours 2. Will adjust Cefepime to 2g IV every 12 hours (discussed with MD) 3. Consider addressing LOT - recommendation for PNA is ~8 days (stop date of 9/6 4. Will continue to follow renal function, culture results, LOT, and antibiotic de-escalation plans   Alycia Rossetti, PharmD, BCPS Clinical Pharmacist Pager: [phone] 08/23/2014 8:53 AM

## 2014-08-23 NOTE — Progress Notes (Signed)
Occupational Therapy Treatment Patient Details Name: Maria [name] MRN: [id number] DOB: 24-Oct-1956 Today's Date: 08/23/2014    History of present illness This 58 y.o female admitted with temp and decreased BP, as well as increased back pain.  Diagnosis sepsis due to PNA.  MRI of lumbar spine showed Multilevel degenerative change the lumbar spine. Little change from the MRI of 05/13/2013.  PMH includes: chronic back pain; morbid obesity; anxiety/depression   OT comments  Pt educated in energy conservation and B UE theraband exercises to perform outside of OT sessions.  Follow Up Recommendations  No OT follow up;Supervision - Intermittent    Equipment Recommendations  Tub/shower bench Bariatric rollator   Recommendations for Other Services      Precautions / Restrictions Precautions Precautions: Fall Precaution Comments: monitor 02 sats Restrictions Weight Bearing Restrictions: No       Mobility Bed Mobility             General bed mobility comments: pt up in chair  Transfers Overall transfer level: Modified independent Equipment used: None             General transfer comment: up to stand at mod I level with no physical assist needed, no LOB noted    Balance                                   ADL                                         General ADL Comments: Instructed in energy conservation and provided handout.  Educated in purse lip breathing and pacing.  Reinforced use of AE for LB ADL.  Recommended pt purchase a hand held shower head she can shut off at the handle to prevent heat build up and steam.        Vision                     Perception     Praxis      Cognition   Behavior During Therapy: [facility] for tasks assessed/performed Overall Cognitive Status: Within Functional Limits for tasks assessed                       Extremity/Trunk Assessment               Exercises Other  Exercises Other Exercises: Level 3 theraband x 10 each, shoulder horizontal abduction, shoulder flexion bilaterally   Shoulder Instructions       General Comments      Pertinent Vitals/ Pain       Pain Assessment: 0-10 Pain Score: 4  Faces Pain Scale: Hurts little more Pain Location: back Pain Descriptors / Indicators: Aching Pain Intervention(s): Premedicated before session;Monitored during session  Home Living                                          Prior Functioning/Environment              Frequency Min 2X/week     Progress Toward Goals  OT Goals(current goals can now be found in the care plan section)  Progress towards OT goals: Progressing toward goals  Acute Rehab  OT Goals Patient Stated Goal: to get better  Plan Discharge plan remains appropriate    Co-evaluation                 End of Session     Activity Tolerance Patient tolerated treatment well   Patient Left in chair;with call bell/phone within reach   Nurse Communication          Time: 1345-1418 OT Time Calculation (min): 33 min  Charges: OT General Charges $OT Visit: 1 Procedure OT Treatments $Self Care/Home Management : 8-22 mins $Therapeutic Exercise: 8-22 mins  Malka So 08/23/2014, 2:25 PM [phone]

## 2014-08-23 NOTE — Progress Notes (Signed)
Patient educated on pneumonia via handouts and explanation.  Patient verbalized understanding.  Will continue to try to wean oxygen.

## 2014-08-23 NOTE — Plan of Care (Signed)
Problem: Phase II Progression Outcomes Goal: Wean O2 if indicated Outcome: Not Met (add Reason) Unable to wean Oxygen- desaturation on room air ongoing

## 2014-08-24 LAB — GLUCOSE, CAPILLARY: Glucose-Capillary: 93 mg/dL (ref 70–99)

## 2014-08-24 MED ORDER — POLYETHYLENE GLYCOL 3350 17 G PO PACK
17.0000 g | PACK | Freq: Every day | ORAL | Status: DC
  Administered 2014-08-24: 17 g via ORAL
  Filled 2014-08-24: qty 1

## 2014-08-24 MED ORDER — PHENOL 1.4 % MT LIQD
1.0000 | OROMUCOSAL | Status: DC | PRN
  Administered 2014-08-24: 1 via OROMUCOSAL
  Filled 2014-08-24: qty 177

## 2014-08-24 MED ORDER — MENTHOL 3 MG MT LOZG
1.0000 | LOZENGE | OROMUCOSAL | Status: DC | PRN
  Administered 2014-08-24: 3 mg via ORAL
  Filled 2014-08-24: qty 9

## 2014-08-24 MED ORDER — OXYCODONE HCL 5 MG PO TABS: 5.0000 mg | ORAL_TABLET | Freq: Four times a day (QID) | ORAL | Status: DC | PRN

## 2014-08-24 MED ORDER — LEVOFLOXACIN 500 MG PO TABS: 500.0000 mg | ORAL_TABLET | Freq: Every day | ORAL | Status: DC

## 2014-08-24 NOTE — Discharge Instructions (Signed)
Follow with Maria [name] Cathlean Cower, [name] in 7 days   Get CBC, CMP, 2 view Chest X ray checked  by Maria [name] next visit.    Activity: As tolerated with Full fall precautions use walker/cane & assistance as needed   Disposition Home    Diet: Heart Healthy , feeding assistance and aspiration precautions if needed.  For Heart failure patients - Check your Weight same time everyday, if you gain over 2 pounds, or you develop in leg swelling, experience more shortness of breath or chest pain, call your Maria [name] immediately. Follow Cardiac Low Salt Diet and 1.8 lit/day fluid restriction.   On your next visit with her Maria care physician please Get Medicines reviewed and adjusted.  Please request your Prim.[name] to go over all Hospital Tests and Procedure/Radiological results at the follow up, please get all Hospital records sent to your Prim [name] by signing hospital release before you go home.   If you experience worsening of your admission symptoms, develop shortness of breath, life threatening emergency, suicidal or homicidal thoughts you must seek medical attention immediately by calling 911 or calling your [name] immediately  if symptoms less severe.  You Must read complete instructions/literature along with all the possible adverse reactions/side effects for all the Medicines you take and that have been prescribed to you. Take any new Medicines after you have completely understood and accpet all the possible adverse reactions/side effects.   Do not drive, operating heavy machinery, perform activities at heights, swimming or participation in water activities or provide baby sitting services if your were admitted for syncope or siezures until you have seen by Maria [name] or a Neurologist and advised to do so again.  Do not drive when taking Pain medications.    Do not take more than prescribed Pain, Sleep and Anxiety Medications  Special Instructions: If you have smoked or chewed Tobacco  in the  last 2 yrs please stop smoking, stop any regular Alcohol  and or any Recreational drug use.  Wear Seat belts while driving.   Please note  You were cared for by a hospitalist during your hospital stay. If you have any questions about your discharge medications or the care you received while you were in the hospital after you are discharged, you can call the unit and asked to speak with the hospitalist on call if the hospitalist that took care of you is not available. Once you are discharged, your Maria care physician will handle any further medical issues. Please note that NO REFILLS for any discharge medications will be authorized once you are discharged, as it is imperative that you return to your Maria care physician (or establish a relationship with a Maria care physician if you do not have one) for your aftercare needs so that they can reassess your need for medications and monitor your lab values.

## 2014-08-24 NOTE — Discharge Summary (Signed)
Maria [name], 58 y.o., DOB [birth year], MRN [id number]. Admission date: 08/19/2014 Discharge Date 08/24/2014 Primary MD Cathlean Cower, MD Admitting Physician Robbie Lis, MD  Admission Diagnosis  Chronic pain [338.29] Lumbar disc disease [722.93] CAP (community acquired pneumonia) [486] Sepsis, due to unspecified organism [038.9, 995.91]  Discharge Diagnosis   Active Problems:   ANXIETY   DEPRESSION   HYPERTENSION   LOW BACK PAIN   Obesity   Lumbar disc disease   Sepsis   CAP (community acquired pneumonia)   Acute respiratory failure with hypoxia   Past Medical History  Diagnosis Date  . Acute lymphadenitis 08/13/2009  . Acute pharyngitis 02/10/2010  . ALLERGIC RHINITIS 08/10/2007  . ANXIETY 12/06/2007  . BACK PAIN 12/06/2007  . CHEST PAIN 08/13/2009  . DEPRESSION 12/06/2007  . DIVERTICULOSIS, [place] 12/06/2007  . GERD 12/06/2007  . HYPERTENSION 12/06/2007  . LOW BACK PAIN 12/06/2007  . MRSA 02/07/2009  . Pain in joint, multiple sites 04/21/2010  . PARONYCHIA, FINGER 08/13/2009  . RASH-NONVESICULAR 05/09/2008  . SHOULDER PAIN, LEFT 05/09/2008  . SINUSITIS- ACUTE-NOS 05/09/2008  . THORACIC/LUMBOSACRAL NEURITIS/RADICULITIS UNSPEC 12/28/2008  . Lumbar disc disease 03/09/2012  . Cervical disc disease 03/09/2012  . Chronic pain 03/09/2012  . Impaired glucose tolerance 03/13/2012  . Cellulitis   . Atherosclerotic peripheral vascular disease 06/13/2013    Aorta on CT June 2014    Past Surgical History  Procedure Laterality Date  . Abdominal hysterectomy    . S/p ovary cyst    . S/p right knee arthroscopy      Dr. Mardelle Matte ortho  . Cervical disc surgery      58 year old WF PMHx chronic back pain, primarily thoracic and lumbar sacral radiculitis and neuritis. She also has morbid obesity weighing 310 pounds, as well as anxiety and depression. She initially presented to the [facility] emergency department on 8/30 with complaints of fever chills and back pain that radiated to her lower shin  it is in cause weakness. She rated the pain at 10/10. No relief with the usual analgesics at home. No falls or trauma. She has had some intermittent cough but no shortness of breath.  In the emergency department she initially was hemodynamically stable except for a temperature of 102.3. But later her blood pressure dropped to 100/52 and there was concern for sepsis the patient was started on broad-spectrum antibiotics. Because of her presentation with back pain there was concern for possible spinal infection so an MRI of the lumbar spine was ordered but due to the patient's obesity she was unable to fit into the MRI scanner at [facility] reported though that she would be able to fit into the scanner at this facility so she was subsequently transferred to [facility]. In the interim gentleman a chest x-ray which questioned a possible mild patch of pasty in the right lung base concerning for possible pneumonia.  Since admission patient has continued with some back pain without any associated neurological symptoms other than subjective reports of weakness and tingling in the legs. The MRI was completed that shows no change from the previous MRI in May of 2014 i.e. there was no evidence for infection such as abscess or discitis. Her exam on the date of transfer was c/w PNA process noting focal congestion in the left base. She remains weak and has limited activity tolerance and is still requiring oxygen initially where she was kept on broad-spectrum IV antibiotics initially IV vancomycin and has asked them, then she  was switched to by mouth Levaquin, she had fever for one day where she was given IV Cefepime, currently she is afebrile for the last 24 hours, will be sent home on by mouth levofloxacin.  Hospital Course See H&P, Labs, Consult and Test reports for all details in brief,  Sepsis  - Due to Pneumonia  -Sepsis physiology has resolved, other supportive care-initial Procalcitonin was  1.38 which was suspicious for infection and has decreased to 0.81 since admission, and most recent is 0.24 .  -So far No growth on blood cultures.  Acute respiratory failure with hypoxia  -Related to community-acquired pneumonia, currently off oxygen, no known history of COPD, but patient was a smoker for more than 20 years .  -Out of bed to chair q. Shift   CAP  - Patient initially treated with IV vancomycin and at that time, then by mouth Levaquin, will be discharged home on total of 4 days of Levaquin  Acute renal insufficiency  -Baseline GFR greater than 90 with BUN 12 and creatinine 0.93-GFR has decreased slightly in patient presented with mild transaminitis and mildly elevated total bilirubin all of which is suspicious for possible volume depletion noting patient was on diuretic prior to admission, renal failure resolved upon discharge.  HYPERTENSION  on hydrochlorothiazide.   LOW BACK PAIN/Lumbar disc disease  -MRI unchanged from previous scan from 05/13/2013-both consistent with multilevel degenerative change in the lumbar spine-continue Flexeril   ANXIETY/DEPRESSION   Morbid Obesity - Body mass index is 58.24 kg/(m^2).      Active Problems:   ANXIETY   DEPRESSION   HYPERTENSION   LOW BACK PAIN   Obesity   Lumbar disc disease   Sepsis   CAP (community acquired pneumonia)   Acute respiratory failure with hypoxia    Consults  PT consult  Significant Tests:  See full reports for all details    Mr Lumbar Spine W Wo Contrast  08/20/2014   CLINICAL DATA:  Intractable back pain  EXAM: MRI LUMBAR SPINE WITHOUT AND WITH CONTRAST  TECHNIQUE: Multiplanar and multiecho pulse sequences of the lumbar spine were obtained without and with intravenous contrast.  CONTRAST:  48mL MULTIHANCE GADOBENATE DIMEGLUMINE 529 MG/ML IV SOLN  COMPARISON:  Lumbar MRI 05/13/2013  FINDINGS: Image quality limited by morbid obesity.  Mild motion also present.  Negative for fracture or mass. No  evidence of spinal infection. Postcontrast imaging reveals no enhancing lesions. Conus medullaris is normal and terminates at L1-2.  L1-2:  Negative  L2-3: Left foraminal disc protrusion unchanged from the prior study. This is causing mild left foraminal encroachment. Bilateral facet hypertrophy and mild spinal stenosis also unchanged.  L3-4: Minimal anterolisthesis unchanged. Diffuse disc bulging. Annular fissure the right foramen is unchanged with a small right foraminal and right lateral disc protrusion. Bilateral facet hypertrophy causing moderate spinal stenosis  L4-5: Mild disc bulging. Bilateral facet hypertrophy with moderate spinal stenosis. Subarticular and foraminal stenosis bilaterally  L5-S1:  Negative  IMPRESSION: Multilevel degenerative change the lumbar spine. Little change from the MRI of 05/13/2013.   Electronically Signed   By: Franchot Gallo M.D.   On: 08/20/2014 10:10   Dg Chest Port 1 View  08/19/2014   CLINICAL DATA:  Fever and chills  EXAM: PORTABLE CHEST - 1 VIEW  COMPARISON:  Prior chest x-ray 08/17/2013  FINDINGS: Artifact in the form of numerous cardiac leads overlies the chest particularly obscuring the right lung base.  Stable cardiomegaly. Perhaps trace patchy airspace opacity in the right lower  lobe. Otherwise, no airspace consolidation, pulmonary edema, pleural effusion or evidence of pneumothorax. No acute osseous abnormality. Incompletely imaged anterior cervical fusion hardware.  IMPRESSION: 1. Perhaps mild patchy opacity in the right lung base although this region is largely obscured by numerous cardiac leads overlying the chest. Recommend dedicated PA and lateral chest x-ray when the patient is able. 2. Stable mild cardiomegaly.   Electronically Signed   By: Jacqulynn Cadet M.D.   On: 08/19/2014 08:32     Today   Subjective:   Kambree Biss today has no headache,no chest abdominal pain,no new weakness tingling or numbness,   Objective:   Blood pressure 144/70,  pulse 75, temperature 98.7 F (37.1 C), temperature source Oral, resp. rate 20, height 5\' 5"  (1.651 m), weight 158.75 kg (349 lb 15.7 oz), SpO2 96.00%.  Intake/Output Summary (Last 24 hours) at 08/24/14 1227 Last data filed at 08/24/14 1000  Gross per 24 hour  Intake    940 ml  Output      0 ml  Net    940 ml    Exam Awake Alert, Oriented *3, No new F.N deficits, Normal affect Eva.AT,PERRAL Supple Neck,No JVD, No cervical lymphadenopathy appriciated.  Symmetrical Chest wall movement, Good air movement bilaterally, CTAB RRR,No Gallops,Rubs or new Murmurs, No Parasternal Heave +ve B.Sounds, Abd Soft, Non tender, No organomegaly appriciated, No rebound -guarding or rigidity. No Cyanosis, Clubbing or edema, No new Rash or bruise  Data Review    Cultures:  8/30 urine culture no growth  8/30 cultures x2 no growth L. discharge date Antibiotics:  Aztreonam 8/30- 9/1  Levaquin 8/30 >>>9/2  Vancomycin 8/30 >>>  Cefepime 9/3   CBC w Diff: Lab Results  Component Value Date   WBC 9.3 08/22/2014   HGB 11.4* 08/22/2014   HCT 33.9* 08/22/2014   PLT 219 08/22/2014   LYMPHOPCT 9* 08/19/2014   MONOPCT 6 08/19/2014   EOSPCT 0 08/19/2014   BASOPCT 0 08/19/2014   CMP: Lab Results  Component Value Date   NA 140 08/22/2014   K 3.8 08/22/2014   CL 104 08/22/2014   CO2 26 08/22/2014   BUN 10 08/22/2014   CREATININE 0.78 08/22/2014   PROT 7.1 08/21/2014   ALBUMIN 3.4* 08/21/2014   BILITOT 0.7 08/21/2014   ALKPHOS 57 08/21/2014   AST 36 08/21/2014   ALT 45* 08/21/2014  .  Micro Results Recent Results (from the past 240 hour(s))  URINE CULTURE     Status: None   Collection Time    08/19/14  7:38 AM      Result Value Ref Range Status   Specimen Description URINE, CATHETERIZED   Final   Special Requests Normal   Final   Culture  Setup Time     Final   Value: 08/19/2014 15:09     Performed at [facility]     Final   Value: NO GROWTH     Performed at [facility]   Culture      Final   Value: NO GROWTH     Performed at [facility]   Report Status 08/20/2014 FINAL   Final  CULTURE, BLOOD (ROUTINE X 2)     Status: None   Collection Time    08/19/14  8:04 AM      Result Value Ref Range Status   Specimen Description BLOOD RIGHT ARM   Final   Special Requests BOTTLES DRAWN AEROBIC AND ANAEROBIC 5CC   Final  Culture  Setup Time     Final   Value: 08/19/2014 14:35     Performed at [facility]   Culture     Final   Value:        BLOOD CULTURE RECEIVED NO GROWTH TO DATE CULTURE WILL BE HELD FOR 5 DAYS BEFORE ISSUING A FINAL NEGATIVE REPORT     Performed at [facility]   Report Status PENDING   Incomplete  CULTURE, BLOOD (ROUTINE X 2)     Status: None   Collection Time    08/19/14  8:04 AM      Result Value Ref Range Status   Specimen Description BLOOD LEFT ARM   Final   Special Requests BOTTLES DRAWN AEROBIC AND ANAEROBIC 5CC   Final   Culture  Setup Time     Final   Value: 08/19/2014 14:35     Performed at [facility]   Culture     Final   Value:        BLOOD CULTURE RECEIVED NO GROWTH TO DATE CULTURE WILL BE HELD FOR 5 DAYS BEFORE ISSUING A FINAL NEGATIVE REPORT     Performed at [facility]   Report Status PENDING   Incomplete  MRSA PCR SCREENING     Status: None   Collection Time    08/19/14  2:24 PM      Result Value Ref Range Status   MRSA by PCR NEGATIVE  NEGATIVE Final   Comment:            The GeneXpert MRSA Assay (FDA     approved for NASAL specimens     only), is one component of a     comprehensive MRSA colonization     surveillance program. It is not     intended to diagnose MRSA     infection nor to guide or     monitor treatment for     MRSA infections.  URINE CULTURE     Status: None   Collection Time    08/19/14  4:31 PM      Result Value Ref Range Status   Specimen Description URINE, RANDOM   Final   Special Requests NONE   Final   Culture  Setup Time     Final   Value: 08/20/2014  01:46     Performed at [facility]     Final   Value: NO GROWTH     Performed at [facility]   Culture     Final   Value: NO GROWTH     Performed at [facility]   Report Status 08/21/2014 FINAL   Final     Discharge Instructions     Follow-up Information   Follow up with Cathlean Cower, MD. Schedule an appointment as soon as possible for a visit in 1 week.   Specialties:  Internal Medicine, Radiology   Contact information:   [address] [place] [address] [phone]       Follow up with [facility]    . Schedule an appointment as soon as possible for a visit in 1 week.   Contact information:   [address] [place] [address] [phone]      Discharge Medications     Medication List         Adjust Bath/Shower Seat Misc  1 Device by Does not apply route as  needed.     albuterol 108 (90 BASE) MCG/ACT inhaler  Commonly known as:  PROVENTIL HFA;VENTOLIN HFA  Inhale 2 puffs into the lungs every 4 (four) hours as needed for wheezing (cough, shortness of breath or wheezing.).     ALPRAZolam 0.5 MG tablet  Commonly known as:  XANAX  take 1 tablet by mouth twice a day if needed for anxiety     cyclobenzaprine 10 MG tablet  Commonly known as:  FLEXERIL  Take 1 tablet (10 mg total) by mouth 2 (two) times daily as needed for muscle spasms.     hydrochlorothiazide 25 MG tablet  Commonly known as:  HYDRODIURIL  Take 25 mg by mouth daily with breakfast.     ibuprofen 200 MG tablet  Commonly known as:  ADVIL,MOTRIN  Take 800 mg by mouth every 6 (six) hours as needed for moderate pain.     levofloxacin 500 MG tablet  Commonly known as:  LEVAQUIN  Take 1 tablet (500 mg total) by mouth daily.     OVER THE COUNTER MEDICATION  Place 2 each into both eyes daily. For dry eyes     oxyCODONE 5 MG immediate release tablet  Commonly known as:  Oxy IR/ROXICODONE  Take 1 tablet (5 mg  total) by mouth every 6 (six) hours as needed for moderate pain or severe pain.         Total Time in preparing paper work, data evaluation and todays exam - 35 minutes  Heena Woodbury M.D on 08/24/2014 at 12:27 PM  [facility] Office  [phone]

## 2014-08-24 NOTE — Care Management Note (Signed)
CARE MANAGEMENT NOTE 08/24/2014  Patient:  Maria [name], Maria [name]   Account Number:  [id number]  Date Initiated:  08/20/2014  Documentation initiated by:  MAYO,HENRIETTA  Subjective/Objective Assessment:   dx sepsis; lives alone    PCP  Cathlean Cower     Action/Plan:   Met with pt who is asking for a rolling walker with a seat, this needs to be bariatric so pt will need a prescription and will need to pick this up at [facility]. Pt states that she has someone who can take her to the medical supply store.   Anticipated DC Date:  08/24/2014   Anticipated DC Plan:  [facility] [facility]      Choice offered to / List presented to:          [facility] arranged  HH-1 RN      [place].   Status of service:  Completed, signed off Medicare Important Message given?   (If response is "NO", the following Medicare IM given date fields will be blank) Date Medicare IM given:   Medicare IM given by:   Date Additional Medicare IM given:   Additional Medicare IM given by:    Discharge Disposition:  [place]  Per UR Regulation:  Reviewed for med. necessity/level of care/duration of stay  If discussed at [place] of Stay Meetings, dates discussed:    Comments:  08/24/2014 Pt given prescription for bariatric rolling walker with seat to take to the medical supply store as we are unable to provide this DME from the hospital. Pt states that she has someone who will take her to the medical supply store. Pt given IM letter as her Medicaid card has no yet arrived. Jasmine Pang RN MPH, case Freight forwarder

## 2014-08-24 NOTE — Progress Notes (Signed)
Patient to be discharged to home. PIV removed. Discharge instructions and new medications reviewed with patient. Prescription for bariatric rolling walker and MATCH letter given to patient. All belongings returned.  Joellen Jersey, RN.

## 2014-08-24 NOTE — Progress Notes (Signed)
Physical Therapy Treatment Patient Details Name: Maria [name] MRN: [id number] DOB: 08-Dec-1956 Today's Date: 08/24/2014    History of Present Illness This 58 y.o female admitted with temp and decreased BP, as well as increased back pain.  Diagnosis sepsis due to PNA.  MRI of lumbar spine showed Multilevel degenerative change the lumbar spine. Little change from the MRI of 05/13/2013.  PMH includes: chronic back pain; morbid obesity; anxiety/depression    PT Comments    Pt pleased with oxygen level of 92% on room air after 100 feet gait with bariatric RW.  Pt left in room with lunch tray after gait.  Follow Up Recommendations  Home health PT;Supervision - Intermittent     Equipment Recommendations  Rolling walker with 5" wheels;Other (comment) (bariatric, would benefit from "life alert" type system)    Recommendations for Other Services       Precautions / Restrictions Precautions Precautions: Fall Precaution Comments: monitor 02 sats    Mobility  Bed Mobility                  Transfers Overall transfer level: Modified independent Equipment used: None             General transfer comment: MOD I from recliner and toilet  Ambulation/Gait Ambulation/Gait assistance: Supervision Ambulation Distance (Feet): 100 Feet Assistive device: Rolling walker (2 wheeled) Gait Pattern/deviations: Wide base of support Gait velocity: decreased   General Gait Details: Amb from recliner to end of bed without RW. o2 92% on room air after gait   Stairs            Wheelchair Mobility    Modified Rankin (Stroke Patients Only)       Balance             Standing balance-Leahy Scale: Fair Standing balance comment: cruises furniture when she doesn't have RW                    Cognition Arousal/Alertness: Awake/alert Behavior During Therapy: WFL for tasks assessed/performed Overall Cognitive Status: Within Functional Limits for tasks assessed                      Exercises      General Comments        Pertinent Vitals/Pain Pain Assessment: Faces Faces Pain Scale: Hurts even more Pain Location: back Pain Descriptors / Indicators: Aching Pain Intervention(s): Other (comment) (Pt state she is going to ask for pain meds after lunch)    Home Living                      Prior Function            PT Goals (current goals can now be found in the care plan section) Acute Rehab PT Goals Patient Stated Goal: to get better Time For Goal Achievement: 08/28/14 Potential to Achieve Goals: Good Progress towards PT goals: Progressing toward goals    Frequency  Min 3X/week    PT Plan Current plan remains appropriate    Co-evaluation             End of Session   Activity Tolerance: Patient tolerated treatment well Patient left: in chair;with call bell/phone within reach     Time: 1311-1322 PT Time Calculation (min): 11 min  Charges:  $Gait Training: 8-22 mins                    G Codes:  Maria [name] 08/24/2014, 1:30 PM

## 2014-08-24 NOTE — Plan of Care (Signed)
Problem: Discharge Progression Outcomes Goal: Vaccine documented on D/C instructions Outcome: Completed/Met Date Met:  08/24/14 Pneumonia vaccine received in 2014.

## 2014-08-25 LAB — CULTURE, BLOOD (ROUTINE X 2)
CULTURE: NO GROWTH
Culture: NO GROWTH

## 2014-09-02 ENCOUNTER — Emergency Department (INDEPENDENT_AMBULATORY_CARE_PROVIDER_SITE_OTHER)
Admission: EM | Admit: 2014-09-02 | Discharge: 2014-09-02 | Disposition: A | Discharge status: Home / Self Care | Payer: Medicaid Other | Source: Home / Self Care | Attending: Family Medicine | Admitting: Family Medicine

## 2014-09-02 ENCOUNTER — Encounter (HOSPITAL_COMMUNITY): Payer: Self-pay | Admitting: Emergency Medicine

## 2014-09-02 ENCOUNTER — Emergency Department (INDEPENDENT_AMBULATORY_CARE_PROVIDER_SITE_OTHER): Payer: Medicaid Other

## 2014-09-02 DIAGNOSIS — IMO0002 Reserved for concepts with insufficient information to code with codable children: Secondary | ICD-10-CM

## 2014-09-02 DIAGNOSIS — R296 Repeated falls: Secondary | ICD-10-CM

## 2014-09-02 DIAGNOSIS — S8391XA Sprain of unspecified site of right knee, initial encounter: Secondary | ICD-10-CM

## 2014-09-02 MED ORDER — MELOXICAM 7.5 MG PO TABS: 7.5000 mg | ORAL_TABLET | Freq: Every day | ORAL | Status: DC

## 2014-09-02 NOTE — ED Provider Notes (Signed)
CSN: [id number]     Arrival date & time 09/02/14  1634 History   First MD Initiated Contact with Patient 09/02/14 1707     Chief Complaint  Patient presents with  . Back Pain  . Knee Pain   (Consider location/radiation/quality/duration/timing/severity/associated sxs/prior Treatment) HPI Comments: Patient presents with acute exacerbation of chronic right knee pain. States that while trying to stand and put her pajama pants (on 08/31/2014) she briefly lost her balance and fell against a wall and in the process she twisted her right knee. Has had discomfort with ambulation since fall. During fall, she did not land directly on her knee.  Has had right knee arthroscopy in the past performed by Dr. Mardelle Matte.  PCP: Dr. Cathlean Marseilles  Patient is a 58 y.o. female presenting with back pain and knee pain. The history is provided by the patient.  Back Pain Knee Pain Associated symptoms: back pain     Past Medical History  Diagnosis Date  . Acute lymphadenitis 08/13/2009  . Acute pharyngitis 02/10/2010  . ALLERGIC RHINITIS 08/10/2007  . ANXIETY 12/06/2007  . BACK PAIN 12/06/2007  . CHEST PAIN 08/13/2009  . DEPRESSION 12/06/2007  . DIVERTICULOSIS, [place] 12/06/2007  . GERD 12/06/2007  . HYPERTENSION 12/06/2007  . LOW BACK PAIN 12/06/2007  . MRSA 02/07/2009  . Pain in joint, multiple sites 04/21/2010  . PARONYCHIA, FINGER 08/13/2009  . RASH-NONVESICULAR 05/09/2008  . SHOULDER PAIN, LEFT 05/09/2008  . SINUSITIS- ACUTE-NOS 05/09/2008  . THORACIC/LUMBOSACRAL NEURITIS/RADICULITIS UNSPEC 12/28/2008  . Lumbar disc disease 03/09/2012  . Cervical disc disease 03/09/2012  . Chronic pain 03/09/2012  . Impaired glucose tolerance 03/13/2012  . Cellulitis   . Atherosclerotic peripheral vascular disease 06/13/2013    Aorta on CT June 2014   Past Surgical History  Procedure Laterality Date  . Abdominal hysterectomy    . S/p ovary cyst    . S/p right knee arthroscopy      Dr. Mardelle Matte ortho  . Cervical disc surgery      Family History  Problem Relation Age of Onset  . Heart disease Mother   . Hypertension Mother   . Diabetes Mother   . Heart failure Mother   . Asthma Sister   . Asthma Daughter   . Cancer Maternal Uncle     [place]  . Cancer Other     ovarian  . Hypertension Father    History  Substance Use Topics  . Smoking status: Former Smoker -- 30 years    Quit date: 11/08/2008  . Smokeless tobacco: Never Used     Comment: quit 10/09  . Alcohol Use: No   OB History   Grav Para Term Preterm Abortions TAB SAB Ect Mult Living                 Review of Systems  Musculoskeletal: Positive for back pain.  All other systems reviewed and are negative.   Allergies  Adhesive; Amoxicillin-pot clavulanate; Codeine; Lactose intolerance (gi); Morphine and related; Vicodin; and Latex  Home Medications   Prior to Admission medications   Medication Sig Start Date End Date Taking? Authorizing Provider  ALPRAZolam Duanne Moron) 0.5 MG tablet take 1 tablet by mouth twice a day if needed for anxiety 05/08/14  Yes Biagio Borg, MD  hydrochlorothiazide (HYDRODIURIL) 25 MG tablet Take 25 mg by mouth daily with breakfast.    Yes Historical Provider, MD  [place] 2 each into both eyes daily. For dry eyes   Yes Historical  Provider, MD  albuterol (PROVENTIL HFA;VENTOLIN HFA) 108 (90 BASE) MCG/ACT inhaler Inhale 2 puffs into the lungs every 4 (four) hours as needed for wheezing (cough, shortness of breath or wheezing.). 10/03/13   Biagio Borg, MD  cyclobenzaprine (FLEXERIL) 10 MG tablet Take 1 tablet (10 mg total) by mouth 2 (two) times daily as needed for muscle spasms. 07/05/14   Courtney A Forcucci, PA-C  ibuprofen (ADVIL,MOTRIN) 200 MG tablet Take 800 mg by mouth every 6 (six) hours as needed for moderate pain.    Historical Provider, MD  levofloxacin (LEVAQUIN) 500 MG tablet Take 1 tablet (500 mg total) by mouth daily. 08/24/14   Phillips Climes, MD  meloxicam (MOBIC) 7.5 MG tablet Take 1  tablet (7.5 mg total) by mouth daily. 09/02/14   Lutricia Feil, PA  Misc. Devices (ADJUST BATH/SHOWER SEAT) MISC 1 Device by Does not apply route as needed. 08/21/14   Samella Parr, NP  oxyCODONE (OXY IR/ROXICODONE) 5 MG immediate release tablet Take 1 tablet (5 mg total) by mouth every 6 (six) hours as needed for moderate pain or severe pain. 08/24/14   Dawood Elgergawy, MD   BP 175/71  Pulse 96  Temp(Src) 98.4 F (36.9 C) (Oral)  Resp 16  SpO2 97% Physical Exam  Nursing note and vitals reviewed. Constitutional: She is oriented to person, place, and time. She appears well-developed and well-nourished. No distress.  HENT:  Head: Normocephalic and atraumatic.  Eyes: Conjunctivae are normal. No scleral icterus.  Cardiovascular: Normal rate.   Pulmonary/Chest: Effort normal.  Musculoskeletal:       Right knee: She exhibits decreased range of motion, swelling, effusion, ecchymosis, deformity, laceration and erythema. Tenderness found. Medial joint line tenderness noted.       Legs: Neurological: She is alert and oriented to person, place, and time.  Skin: Skin is warm and dry.  Psychiatric: She has a normal mood and affect. Her behavior is normal.    ED Course  Procedures (including critical care time) Labs Review Labs Reviewed - No data to display  Imaging Review Dg Knee Complete 4 Views Right  09/02/2014   CLINICAL DATA:  Medial knee pain.  Twisting injury 08/31/2014.  EXAM: RIGHT KNEE - COMPLETE 4+ VIEW  COMPARISON:  05/06/2011.  FINDINGS: Mild to moderate medial compartment osteoarthritis. Mild patellofemoral osteoarthritis. No gross knee effusion. The alignment of the knee is anatomic. There is no fracture.  IMPRESSION: Medial and patellofemoral osteoarthritis.   Electronically Signed   By: Dereck Ligas M.D.   On: 09/02/2014 18:02     MDM   1. Sprain of right knee, initial encounter    Recent creatinine levels (8/30-08/22/2014) all normal. Films negative for acute  injury. Will treat with Mobic 7.5mg  po QD and advise follow up with her orthopedist (Dr. Mardelle Matte) if symptoms persist.     Lutricia Feil, [place] 09/02/14 1815

## 2014-09-02 NOTE — ED Notes (Signed)
C/o low back pain. Hx.  DDD and spinal stenosis.  C/o R knee pain.  She was putting on her pajama's and when she lifted her leg to put on the pant leg, she lost her balance and fell against the wall with her shoulder.  This caused her R knee to twist. Recently d/c from hosp for pneumonia, sepsis and renal failure and resp. failure.

## 2014-09-02 NOTE — Discharge Instructions (Signed)
Your xrays were without evidence of acute injury. As I am sure your are aware, you do have moderate osteoarthritis in your right knee. Please use medication as prescribed for your pain and follow up with your Dr. Mardelle Matte if symptoms do not improve.  Joint Sprain A sprain is a tear or stretch in the ligaments that hold a joint together. Severe sprains may need as long as 3-6 weeks of immobilization and/or exercises to heal completely. Sprained joints should be rested and protected. If not, they can become unstable and prone to re-injury. Proper treatment can reduce your pain, shorten the period of disability, and reduce the risk of repeated injuries. TREATMENT   Rest and elevate the injured joint to reduce pain and swelling.  Apply ice packs to the injury for 20-30 minutes every 2-3 hours for the next 2-3 days.  Keep the injury wrapped in a compression bandage or splint as long as the joint is painful or as instructed by your caregiver.  Do not use the injured joint until it is completely healed to prevent re-injury and chronic instability. Follow the instructions of your caregiver.  Long-term sprain management may require exercises and/or treatment by a physical therapist. Taping or special braces may help stabilize the joint until it is completely better. SEEK MEDICAL CARE IF:   You develop increased pain or swelling of the joint.  You develop increasing redness and warmth of the joint.  You develop a fever.  It becomes stiff.  Your hand or foot gets cold or numb. Document Released: 01/14/2005 Document Revised: 02/29/2012 Document Reviewed: 12/24/2008 [facility] Patient Information 2015 [place], [place]. This information is not intended to replace advice given to you by your health care provider. Make sure you discuss any questions you have with your health care provider.

## 2014-09-03 NOTE — ED Provider Notes (Signed)
Medical screening examination/treatment/procedure(s) were performed by a resident physician or non-physician practitioner and as the supervising physician I was immediately available for consultation/collaboration.  Linna Darner, MD Family Medicine   Waldemar Dickens, MD 09/03/14 [phone]

## 2014-09-04 ENCOUNTER — Telehealth: Payer: Self-pay | Admitting: *Deleted

## 2014-09-04 ENCOUNTER — Other Ambulatory Visit: Payer: Self-pay | Admitting: Internal Medicine

## 2014-09-04 NOTE — Telephone Encounter (Signed)
See below

## 2014-09-04 NOTE — Telephone Encounter (Signed)
Called heather no answer LMOM RTC...Maria [name]

## 2014-09-04 NOTE — Telephone Encounter (Signed)
Chart reviewed, pt with recent PT in hospital, then also seen after fall with right knee pain in ER, asked to f/u with Dr Mardelle Matte  Please f/u with Dr Thea Alken to see if knee condition will heal, and to address PT, walker need at that time

## 2014-09-04 NOTE — Telephone Encounter (Signed)
Left msg on triage stating she left msg on 08/28/14 pt d/c from hospital with home care services. Requesting orders for PT & OT. Also pt is requesting a walker due to helping relieving pain & being stable on her feet...[name]

## 2014-09-05 NOTE — Telephone Encounter (Signed)
Called Maria [name] no answer LMOM with md response...[name]

## 2014-09-05 NOTE — Telephone Encounter (Signed)
Done hardcopy to robin  

## 2014-09-05 NOTE — Telephone Encounter (Signed)
Faxed hardcopy for Alprazolam to [place]

## 2014-09-12 DIAGNOSIS — J189 Pneumonia, unspecified organism: Secondary | ICD-10-CM

## 2014-09-12 DIAGNOSIS — I10 Essential (primary) hypertension: Secondary | ICD-10-CM

## 2014-09-19 ENCOUNTER — Emergency Department (HOSPITAL_COMMUNITY)
Admission: EM | Admit: 2014-09-19 | Discharge: 2014-09-19 | Disposition: A | Discharge status: Home / Self Care | Payer: Medicaid Other | Attending: Emergency Medicine | Admitting: Emergency Medicine

## 2014-09-19 ENCOUNTER — Emergency Department (HOSPITAL_COMMUNITY): Payer: Medicaid Other

## 2014-09-19 ENCOUNTER — Encounter (HOSPITAL_COMMUNITY): Payer: Self-pay | Admitting: Emergency Medicine

## 2014-09-19 DIAGNOSIS — R609 Edema, unspecified: Secondary | ICD-10-CM | POA: Insufficient documentation

## 2014-09-19 DIAGNOSIS — Z8709 Personal history of other diseases of the respiratory system: Secondary | ICD-10-CM | POA: Diagnosis not present

## 2014-09-19 DIAGNOSIS — F329 Major depressive disorder, single episode, unspecified: Secondary | ICD-10-CM | POA: Insufficient documentation

## 2014-09-19 DIAGNOSIS — F3289 Other specified depressive episodes: Secondary | ICD-10-CM | POA: Insufficient documentation

## 2014-09-19 DIAGNOSIS — R509 Fever, unspecified: Secondary | ICD-10-CM | POA: Insufficient documentation

## 2014-09-19 DIAGNOSIS — Z880 Allergy status to penicillin: Secondary | ICD-10-CM | POA: Insufficient documentation

## 2014-09-19 DIAGNOSIS — Z8614 Personal history of Methicillin resistant Staphylococcus aureus infection: Secondary | ICD-10-CM | POA: Insufficient documentation

## 2014-09-19 DIAGNOSIS — M172 Bilateral post-traumatic osteoarthritis of knee: Secondary | ICD-10-CM | POA: Insufficient documentation

## 2014-09-19 DIAGNOSIS — Z792 Long term (current) use of antibiotics: Secondary | ICD-10-CM | POA: Diagnosis not present

## 2014-09-19 DIAGNOSIS — Z8719 Personal history of other diseases of the digestive system: Secondary | ICD-10-CM | POA: Insufficient documentation

## 2014-09-19 DIAGNOSIS — Z87891 Personal history of nicotine dependence: Secondary | ICD-10-CM | POA: Insufficient documentation

## 2014-09-19 DIAGNOSIS — N390 Urinary tract infection, site not specified: Secondary | ICD-10-CM | POA: Insufficient documentation

## 2014-09-19 DIAGNOSIS — G894 Chronic pain syndrome: Secondary | ICD-10-CM | POA: Insufficient documentation

## 2014-09-19 DIAGNOSIS — F411 Generalized anxiety disorder: Secondary | ICD-10-CM | POA: Insufficient documentation

## 2014-09-19 DIAGNOSIS — F325 Major depressive disorder, single episode, in full remission: Secondary | ICD-10-CM | POA: Insufficient documentation

## 2014-09-19 DIAGNOSIS — Z872 Personal history of diseases of the skin and subcutaneous tissue: Secondary | ICD-10-CM | POA: Insufficient documentation

## 2014-09-19 DIAGNOSIS — M4800 Spinal stenosis, site unspecified: Secondary | ICD-10-CM | POA: Insufficient documentation

## 2014-09-19 DIAGNOSIS — R82998 Other abnormal findings in urine: Secondary | ICD-10-CM | POA: Diagnosis not present

## 2014-09-19 DIAGNOSIS — I10 Essential (primary) hypertension: Secondary | ICD-10-CM | POA: Diagnosis not present

## 2014-09-19 DIAGNOSIS — G8929 Other chronic pain: Secondary | ICD-10-CM | POA: Insufficient documentation

## 2014-09-19 DIAGNOSIS — Z91040 Latex allergy status: Secondary | ICD-10-CM | POA: Diagnosis not present

## 2014-09-19 DIAGNOSIS — Z8739 Personal history of other diseases of the musculoskeletal system and connective tissue: Secondary | ICD-10-CM | POA: Diagnosis not present

## 2014-09-19 DIAGNOSIS — Z79899 Other long term (current) drug therapy: Secondary | ICD-10-CM | POA: Insufficient documentation

## 2014-09-19 DIAGNOSIS — Z791 Long term (current) use of non-steroidal anti-inflammatories (NSAID): Secondary | ICD-10-CM | POA: Insufficient documentation

## 2014-09-19 DIAGNOSIS — F1120 Opioid dependence, uncomplicated: Secondary | ICD-10-CM | POA: Insufficient documentation

## 2014-09-19 LAB — COMPREHENSIVE METABOLIC PANEL
ALT: 39 U/L — AB (ref 0–35)
AST: 33 U/L (ref 0–37)
Albumin: 3.6 g/dL (ref 3.5–5.2)
Alkaline Phosphatase: 74 U/L (ref 39–117)
Anion gap: 13 (ref 5–15)
BUN: 12 mg/dL (ref 6–23)
CALCIUM: 8.9 mg/dL (ref 8.4–10.5)
CO2: 26 meq/L (ref 19–32)
CREATININE: 0.87 mg/dL (ref 0.50–1.10)
Chloride: 100 mEq/L (ref 96–112)
GFR calc Af Amer: 84 mL/min — ABNORMAL LOW (ref >90)
GFR, EST NON AFRICAN AMERICAN: 73 mL/min — AB (ref >90)
Glucose, Bld: 132 mg/dL — ABNORMAL HIGH (ref 70–99)
Potassium: 3.5 mEq/L — ABNORMAL LOW (ref 3.7–5.3)
Sodium: 139 mEq/L (ref 137–147)
Total Bilirubin: 1 mg/dL (ref 0.3–1.2)
Total Protein: 7.3 g/dL (ref 6.0–8.3)

## 2014-09-19 LAB — URINE MICROSCOPIC-ADD ON

## 2014-09-19 LAB — CBC WITH DIFFERENTIAL/PLATELET
Basophils Absolute: 0 10*3/uL (ref 0.0–0.1)
Basophils Relative: 0 % (ref 0–1)
EOS PCT: 0 % (ref 0–5)
Eosinophils Absolute: 0.1 10*3/uL (ref 0.0–0.7)
HCT: 39.8 % (ref 36.0–46.0)
Hemoglobin: 13.4 g/dL (ref 12.0–15.0)
LYMPHS ABS: 1.2 10*3/uL (ref 0.7–4.0)
Lymphocytes Relative: 7 % — ABNORMAL LOW (ref 12–46)
MCH: 31.5 pg (ref 26.0–34.0)
MCHC: 33.7 g/dL (ref 30.0–36.0)
MCV: 93.6 fL (ref 78.0–100.0)
Monocytes Absolute: 0.8 10*3/uL (ref 0.1–1.0)
Monocytes Relative: 5 % (ref 3–12)
NEUTROS ABS: 14.9 10*3/uL — AB (ref 1.7–7.7)
NEUTROS PCT: 88 % — AB (ref 43–77)
PLATELETS: 211 10*3/uL (ref 150–400)
RBC: 4.25 MIL/uL (ref 3.87–5.11)
RDW: 12.4 % (ref 11.5–15.5)
WBC: 17 10*3/uL — ABNORMAL HIGH (ref 4.0–10.5)

## 2014-09-19 LAB — URINALYSIS, ROUTINE W REFLEX MICROSCOPIC
Bilirubin Urine: NEGATIVE
GLUCOSE, UA: NEGATIVE mg/dL
KETONES UR: NEGATIVE mg/dL
Nitrite: NEGATIVE
PROTEIN: NEGATIVE mg/dL
Specific Gravity, Urine: 1.014 (ref 1.005–1.030)
UROBILINOGEN UA: 0.2 mg/dL (ref 0.0–1.0)
pH: 5 (ref 5.0–8.0)

## 2014-09-19 LAB — LACTIC ACID, PLASMA: Lactic Acid, Venous: 2.4 mmol/L — ABNORMAL HIGH (ref 0.5–2.2)

## 2014-09-19 LAB — LIPASE, BLOOD: Lipase: 15 U/L (ref 11–59)

## 2014-09-19 MED ORDER — IBUPROFEN 800 MG PO TABS
800.0000 mg | ORAL_TABLET | Freq: Once | ORAL | Status: AC
  Administered 2014-09-19: 800 mg via ORAL
  Filled 2014-09-19: qty 1

## 2014-09-19 MED ORDER — SODIUM CHLORIDE 0.9 % IV BOLUS (SEPSIS)
1000.0000 mL | Freq: Once | INTRAVENOUS | Status: AC
  Administered 2014-09-19: 1000 mL via INTRAVENOUS

## 2014-09-19 MED ORDER — CEPHALEXIN 250 MG PO CAPS
500.0000 mg | ORAL_CAPSULE | Freq: Once | ORAL | Status: DC
  Filled 2014-09-19: qty 2

## 2014-09-19 MED ORDER — DEXTROSE 5 % IV SOLN
1.0000 g | Freq: Once | INTRAVENOUS | Status: AC
  Administered 2014-09-19: 1 g via INTRAVENOUS
  Filled 2014-09-19: qty 10

## 2014-09-19 MED ORDER — CEPHALEXIN 500 MG PO CAPS: 500.0000 mg | ORAL_CAPSULE | Freq: Two times a day (BID) | ORAL | Status: DC

## 2014-09-19 MED ORDER — LIDOCAINE HCL (PF) 1 % IJ SOLN
5.0000 mL | Freq: Once | INTRAMUSCULAR | Status: DC
  Filled 2014-09-19: qty 5

## 2014-09-19 MED ORDER — OXYCODONE HCL 5 MG PO TABS
10.0000 mg | ORAL_TABLET | Freq: Once | ORAL | Status: AC
  Administered 2014-09-19: 10 mg via ORAL
  Filled 2014-09-19: qty 2

## 2014-09-19 NOTE — ED Notes (Signed)
Pt has spoke with care management about paying for antibiotic, should only be a dollar or two with medicaid. Pt is trying to call her friend to get a ride. We have call in with social work for assistance with ride home.

## 2014-09-19 NOTE — ED Notes (Signed)
Case manager at bedside 

## 2014-09-19 NOTE — Discharge Instructions (Signed)
Urinary Tract Infection Maria [name], you were seen today for burning urination and fever.  Your urine shows an infection.  Take your antibiotics as prescribed and follow up with your regular doctor within 3 days.  If any symptoms worsen, or if you do not improve come back to the ED immediately.  You may have a different infection as well.  Thank you. A urinary tract infection (UTI) can occur any place along the urinary tract. The tract includes the kidneys, ureters, bladder, and urethra. A type of germ called bacteria often causes a UTI. UTIs are often helped with antibiotic medicine.  HOME CARE   If given, take antibiotics as told by your doctor. Finish them even if you start to feel better.  Drink enough fluids to keep your pee (urine) clear or pale yellow.  Avoid tea, drinks with caffeine, and bubbly (carbonated) drinks.  Pee often. Avoid holding your pee in for a long time.  Pee before and after having sex (intercourse).  Wipe from front to back after you poop (bowel movement) if you are a woman. Use each tissue only once. GET HELP RIGHT AWAY IF:   You have back pain.  You have lower belly (abdominal) pain.  You have chills.  You feel sick to your stomach (nauseous).  You throw up (vomit).  Your burning or discomfort with peeing does not go away.  You have a fever.  Your symptoms are not better in 3 days. MAKE SURE YOU:   Understand these instructions.  Will watch your condition.  Will get help right away if you are not doing well or get worse. Document Released: 05/25/2008 Document Revised: 08/31/2012 Document Reviewed: 07/07/2012 [facility] Patient Information 2015 [place], [place]. This information is not intended to replace advice given to you by your health care provider. Make sure you discuss any questions you have with your health care provider.

## 2014-09-19 NOTE — ED Notes (Signed)
Pt discharged to the waiting room. Still calling for ride to come get her. Social work bringing Chief Strategy Officer to patient. Provided [place] sandwich and beverage for wait.

## 2014-09-19 NOTE — ED Notes (Signed)
Patient presents from home via EMS with c/o generalized weakness, fever and chills.  States she was in the hospital 1 month ago diagnosed with urosepsis.  Completed her antibiotics and fever and feeling bad started tonight.

## 2014-09-19 NOTE — ED Provider Notes (Addendum)
CSN: [id number]     Arrival date & time 09/19/14  0241 History   First MD Initiated Contact with Patient 09/19/14 0251     Chief Complaint  Patient presents with  . Chills  . Generalized Body Aches  . Fever     (Consider location/radiation/quality/duration/timing/severity/associated sxs/prior Treatment) HPI Maria [name] is a 58 y.o. female with hypertension and chronic pain coming in with fevers and chills. Patient states his occurred around 12:30 AM last night. She states this woke her up out of her sleep. She states she was here approximately one month ago for pneumonia and sepsis. She also had some nausea with no vomiting. Patient states he felt very weak. She does admit to having pain but is her normal chronic pain in her back in the neck and shoulder. There is no changes with this pain. She's denying any productive cough. She denied dysuria to me because the nursing staff she did have some dysuria. She has no abdominal pain or changes in her bowel movements. Patient's denying any sick contacts. She has no further complaints.  10 Systems reviewed and are negative for acute change except as noted in the HPI.     Past Medical History  Diagnosis Date  . Acute lymphadenitis 08/13/2009  . Acute pharyngitis 02/10/2010  . ALLERGIC RHINITIS 08/10/2007  . ANXIETY 12/06/2007  . BACK PAIN 12/06/2007  . CHEST PAIN 08/13/2009  . DEPRESSION 12/06/2007  . DIVERTICULOSIS, [place] 12/06/2007  . GERD 12/06/2007  . HYPERTENSION 12/06/2007  . LOW BACK PAIN 12/06/2007  . MRSA 02/07/2009  . Pain in joint, multiple sites 04/21/2010  . PARONYCHIA, FINGER 08/13/2009  . RASH-NONVESICULAR 05/09/2008  . SHOULDER PAIN, LEFT 05/09/2008  . SINUSITIS- ACUTE-NOS 05/09/2008  . THORACIC/LUMBOSACRAL NEURITIS/RADICULITIS UNSPEC 12/28/2008  . Lumbar disc disease 03/09/2012  . Cervical disc disease 03/09/2012  . Chronic pain 03/09/2012  . Impaired glucose tolerance 03/13/2012  . Cellulitis   . Atherosclerotic peripheral  vascular disease 06/13/2013    Aorta on CT June 2014   Past Surgical History  Procedure Laterality Date  . Abdominal hysterectomy    . S/p ovary cyst    . S/p right knee arthroscopy      Dr. Mardelle Matte ortho  . Cervical disc surgery     Family History  Problem Relation Age of Onset  . Heart disease Mother   . Hypertension Mother   . Diabetes Mother   . Heart failure Mother   . Asthma Sister   . Asthma Daughter   . Cancer Maternal Uncle     [place]  . Cancer Other     ovarian  . Hypertension Father    History  Substance Use Topics  . Smoking status: Former Smoker -- 30 years    Quit date: 11/08/2008  . Smokeless tobacco: Never Used     Comment: quit 10/09  . Alcohol Use: No   OB History   Grav Para Term Preterm Abortions TAB SAB Ect Mult Living                 Review of Systems    Allergies  Adhesive; Amoxicillin-pot clavulanate; Codeine; Lactose intolerance (gi); Morphine and related; Vicodin; and Latex  Home Medications   Prior to Admission medications   Medication Sig Start Date End Date Taking? Authorizing Provider  ALPRAZolam Duanne Moron) 0.5 MG tablet take 1 tablet by mouth twice a day if needed for ANXIETY 09/05/14  Yes Biagio Borg, MD  hydrochlorothiazide (HYDRODIURIL) 25 MG tablet Take 25  mg by mouth daily with breakfast.    Yes Historical Provider, MD  meloxicam (MOBIC) 7.5 MG tablet Take 1 tablet (7.5 mg total) by mouth daily. 09/02/14  Yes Audelia Hives Presson, PA  oxyCODONE (OXY IR/ROXICODONE) 5 MG immediate release tablet Take 1 tablet (5 mg total) by mouth every 6 (six) hours as needed for moderate pain or severe pain. 08/24/14  Yes Dawood Elgergawy, MD  levofloxacin (LEVAQUIN) 500 MG tablet Take 1 tablet (500 mg total) by mouth daily. 08/24/14   Phillips Climes, MD  Misc. Devices (ADJUST BATH/SHOWER SEAT) MISC 1 Device by Does not apply route as needed. 08/21/14   Samella Parr, NP   BP 154/98  Pulse 106  Temp(Src) 98.5 F (36.9 C) (Oral)  Resp 24  Ht 5'  5" (1.651 m)  Wt 340 lb (154.223 kg)  BMI 56.58 kg/m2  SpO2 97% Physical Exam  Nursing note and vitals reviewed. Constitutional: She is oriented to person, place, and time. She appears well-developed and well-nourished. No distress.  HENT:  Head: Normocephalic and atraumatic.  Nose: Nose normal.  Mouth/Throat: Oropharynx is clear and moist. No oropharyngeal exudate.  Eyes: Conjunctivae and EOM are normal. Pupils are equal, round, and reactive to light. No scleral icterus.  Neck: Normal range of motion. Neck supple. No JVD present. No tracheal deviation present. No thyromegaly present.  Cardiovascular: Normal rate, regular rhythm and normal heart sounds.  Exam reveals no gallop and no friction rub.   No murmur heard. Pulmonary/Chest: Effort normal and breath sounds normal. No respiratory distress. She has no wheezes. She exhibits no tenderness.  Abdominal: Soft. Bowel sounds are normal. She exhibits no distension and no mass. There is tenderness. There is no rebound and no guarding.  Suprapubic tenderness to palpation.  Musculoskeletal: Normal range of motion. She exhibits edema. She exhibits no tenderness.  Lymphadenopathy:    She has no cervical adenopathy.  Neurological: She is alert and oriented to person, place, and time. No cranial nerve deficit. She exhibits normal muscle tone.  Skin: Skin is warm and dry. No rash noted. No erythema. No pallor.    ED Course  Procedures (including critical care time) Labs Review Labs Reviewed  CBC WITH DIFFERENTIAL - Abnormal; Notable for the following:    WBC 17.0 (*)    Neutrophils Relative % 88 (*)    Neutro Abs 14.9 (*)    Lymphocytes Relative 7 (*)    All other components within normal limits  COMPREHENSIVE METABOLIC PANEL - Abnormal; Notable for the following:    Potassium 3.5 (*)    Glucose, Bld 132 (*)    ALT 39 (*)    GFR calc non Af Amer 73 (*)    GFR calc Af Amer 84 (*)    All other components within normal limits   URINALYSIS, ROUTINE W REFLEX MICROSCOPIC - Abnormal; Notable for the following:    APPearance CLOUDY (*)    Hgb urine dipstick SMALL (*)    Leukocytes, UA LARGE (*)    All other components within normal limits  LACTIC ACID, PLASMA - Abnormal; Notable for the following:    Lactic Acid, Venous 2.4 (*)    All other components within normal limits  URINE MICROSCOPIC-ADD ON - Abnormal; Notable for the following:    Squamous Epithelial / LPF MANY (*)    Bacteria, UA FEW (*)    All other components within normal limits  URINE CULTURE  LIPASE, BLOOD    Imaging Review Dg Chest 2  View  09/19/2014   CLINICAL DATA:  Fever, chills, generalized body aches.  EXAM: CHEST  2 VIEW  COMPARISON:  08/19/2014  FINDINGS: Normal heart size and pulmonary vascularity. No focal airspace disease or consolidation in the lungs. No blunting of costophrenic angles. No pneumothorax. Mediastinal contours appear intact. Postoperative changes in the cervical spine.  IMPRESSION: No active cardiopulmonary disease.   Electronically Signed   By: Lucienne Capers M.D.   On: 09/19/2014 04:29     EKG Interpretation None      MDM   Final diagnoses:  None    Patient presents emergency department a concern for fevers and chills. Patient states she was shaking and felt cold all over. This is consistent with rigors. Will begin broad infectious workup.  White blood cell count of 17. Patient given home oxycodone for pain relief. Chest x-ray is normal. Awaiting urinalysis. If negative will repeat abdominal exam and consider CT of abdomen and pelvis.  Patient's urinalysis doesn't reveal a few bacteria large leukocytes and too numerous to count white blood cells. There is also many squamous epithelial cells. Given patient's history of dysuria and suprapubic tenderness urinary tract infection is likely. The rest of her abdomen is benign and CT scan is not oriented. After liter fluid patient's heart rate came down to the high 80s low  90s, blood pressure is 559 systolic. She was given 1 g of ceftriaxone emergency department and will be discharged with Keflex prescription.  Patient is currently pending a social work consult to help with transportation and filling her prescription. Her allergy to Augmentin is not due to the amoxicillin as patient states she takes penicillin without any problems. Cephalosporins should be okay for her to take.  Everlene Balls, MD 09/19/14 7416  Everlene Balls, MD 09/19/14 [phone]

## 2014-09-19 NOTE — Progress Notes (Signed)
  CARE MANAGEMENT ED NOTE 09/19/2014  Patient:  Maria [name], Maria [name]   Account Number:  [id number]  Date Initiated:  09/19/2014  Documentation initiated by:  Edwyna Shell  Subjective/Objective Assessment:   58 yo female with Medicaid presenting to the ED with c/o chills and generalized weakness     Subjective/Objective Assessment Detail:     Action/Plan:   Patient stated she can borrow money from her friend to cover the Medicaid copay for the antibiotic.   Action/Plan Detail:   Anticipated DC Date:       Status Recommendation to Physician:   Result of Recommendation:  Agreed    DC Planning Services  CM consult  Other    Choice offered to / List presented to:            Status of service:  Completed, signed off  ED Comments:   ED Comments Detail:  CM consulted to assist with medication needs. This CM spoke with the patient and she confirmed that she does have medicaid. This CM discussed the Medicaid copay and the hospital med assist program is not available for insured individuals. The patient stated that she was hospitalized last month and when she was discharged she used the [facility] program but that was prior to [facility]. This CM discussed borrowing $ from a friend and also encouraged the patient to contact the [facility] by her house because at times they will waive the Medicaid copay. This CM also encouraged her to discuss the matter with her preferred pharmacy [facility] for any assistance. The patient verbalized understanding and stated that she has [place] and the SW is assisting her with resources. The patient then verbalized that she does not have transportation home and cannot take the bus because she is too weak and in her nightgown. This CM left a message for SW and encouraged the patient to call her friend for transportation home. ED staff RN Judson Roch, made aware of transportation concerns and mediation covered through Medicaid. No further CM needs at this time.

## 2014-09-19 NOTE — ED Notes (Signed)
EMS reports VS 146/80 P116 99%RA  Adm Tylenol 1000mg , CBG 145

## 2014-09-20 LAB — URINE CULTURE: Colony Count: 25000

## 2014-10-04 ENCOUNTER — Other Ambulatory Visit: Payer: Self-pay | Admitting: Internal Medicine

## 2014-10-17 DIAGNOSIS — K760 Fatty (change of) liver, not elsewhere classified: Secondary | ICD-10-CM | POA: Insufficient documentation

## 2014-10-17 DIAGNOSIS — J309 Allergic rhinitis, unspecified: Secondary | ICD-10-CM | POA: Insufficient documentation

## 2014-10-17 DIAGNOSIS — K219 Gastro-esophageal reflux disease without esophagitis: Secondary | ICD-10-CM | POA: Insufficient documentation

## 2014-10-17 DIAGNOSIS — M47817 Spondylosis without myelopathy or radiculopathy, lumbosacral region: Secondary | ICD-10-CM | POA: Insufficient documentation

## 2015-01-31 DIAGNOSIS — M797 Fibromyalgia: Secondary | ICD-10-CM | POA: Insufficient documentation

## 2015-03-17 ENCOUNTER — Encounter (HOSPITAL_COMMUNITY): Payer: Self-pay | Admitting: Emergency Medicine

## 2015-03-17 ENCOUNTER — Emergency Department (HOSPITAL_COMMUNITY)
Admission: EM | Admit: 2015-03-17 | Discharge: 2015-03-18 | Disposition: A | Discharge status: Home / Self Care | Payer: Medicaid Other | Attending: Emergency Medicine | Admitting: Emergency Medicine

## 2015-03-17 DIAGNOSIS — Z872 Personal history of diseases of the skin and subcutaneous tissue: Secondary | ICD-10-CM | POA: Diagnosis not present

## 2015-03-17 DIAGNOSIS — Z9889 Other specified postprocedural states: Secondary | ICD-10-CM | POA: Insufficient documentation

## 2015-03-17 DIAGNOSIS — K529 Noninfective gastroenteritis and colitis, unspecified: Secondary | ICD-10-CM | POA: Insufficient documentation

## 2015-03-17 DIAGNOSIS — K219 Gastro-esophageal reflux disease without esophagitis: Secondary | ICD-10-CM | POA: Diagnosis not present

## 2015-03-17 DIAGNOSIS — R1010 Upper abdominal pain, unspecified: Secondary | ICD-10-CM | POA: Diagnosis present

## 2015-03-17 DIAGNOSIS — Z7951 Long term (current) use of inhaled steroids: Secondary | ICD-10-CM | POA: Diagnosis not present

## 2015-03-17 DIAGNOSIS — F329 Major depressive disorder, single episode, unspecified: Secondary | ICD-10-CM | POA: Diagnosis not present

## 2015-03-17 DIAGNOSIS — Z880 Allergy status to penicillin: Secondary | ICD-10-CM | POA: Insufficient documentation

## 2015-03-17 DIAGNOSIS — Z91040 Latex allergy status: Secondary | ICD-10-CM | POA: Insufficient documentation

## 2015-03-17 DIAGNOSIS — Z87891 Personal history of nicotine dependence: Secondary | ICD-10-CM | POA: Diagnosis not present

## 2015-03-17 DIAGNOSIS — R030 Elevated blood-pressure reading, without diagnosis of hypertension: Secondary | ICD-10-CM

## 2015-03-17 DIAGNOSIS — G8929 Other chronic pain: Secondary | ICD-10-CM | POA: Insufficient documentation

## 2015-03-17 DIAGNOSIS — Z8614 Personal history of Methicillin resistant Staphylococcus aureus infection: Secondary | ICD-10-CM | POA: Insufficient documentation

## 2015-03-17 DIAGNOSIS — F419 Anxiety disorder, unspecified: Secondary | ICD-10-CM | POA: Insufficient documentation

## 2015-03-17 DIAGNOSIS — I10 Essential (primary) hypertension: Secondary | ICD-10-CM | POA: Diagnosis not present

## 2015-03-17 DIAGNOSIS — Z90710 Acquired absence of both cervix and uterus: Secondary | ICD-10-CM | POA: Insufficient documentation

## 2015-03-17 DIAGNOSIS — Z79899 Other long term (current) drug therapy: Secondary | ICD-10-CM | POA: Insufficient documentation

## 2015-03-17 DIAGNOSIS — IMO0001 Reserved for inherently not codable concepts without codable children: Secondary | ICD-10-CM

## 2015-03-17 NOTE — ED Notes (Signed)
Pt reports epigastric pain with nausea since 1600 today. Pt states similar pain last year with unknown cause. Pt noted to be hypertensive, hx of same, PCP discontinued antihypertensive medication x1 mo ago. Denies v/d or other complaints other than chronic pain.

## 2015-03-18 ENCOUNTER — Emergency Department (HOSPITAL_COMMUNITY): Payer: Medicaid Other

## 2015-03-18 LAB — CBC WITH DIFFERENTIAL/PLATELET
BASOS ABS: 0 10*3/uL (ref 0.0–0.1)
Basophils Relative: 0 % (ref 0–1)
Eosinophils Absolute: 0.1 10*3/uL (ref 0.0–0.7)
Eosinophils Relative: 1 % (ref 0–5)
HEMATOCRIT: 46.3 % — AB (ref 36.0–46.0)
Hemoglobin: 15.4 g/dL — ABNORMAL HIGH (ref 12.0–15.0)
LYMPHS PCT: 20 % (ref 12–46)
Lymphs Abs: 3 10*3/uL (ref 0.7–4.0)
MCH: 30.6 pg (ref 26.0–34.0)
MCHC: 33.3 g/dL (ref 30.0–36.0)
MCV: 92 fL (ref 78.0–100.0)
Monocytes Absolute: 0.8 10*3/uL (ref 0.1–1.0)
Monocytes Relative: 6 % (ref 3–12)
NEUTROS ABS: 10.8 10*3/uL — AB (ref 1.7–7.7)
NEUTROS PCT: 73 % (ref 43–77)
Platelets: 291 10*3/uL (ref 150–400)
RBC: 5.03 MIL/uL (ref 3.87–5.11)
RDW: 12.8 % (ref 11.5–15.5)
WBC: 14.6 10*3/uL — AB (ref 4.0–10.5)

## 2015-03-18 LAB — COMPREHENSIVE METABOLIC PANEL
ALBUMIN: 4.8 g/dL (ref 3.5–5.2)
ALK PHOS: 78 U/L (ref 39–117)
ALT: 27 U/L (ref 0–35)
AST: 26 U/L (ref 0–37)
Anion gap: 10 (ref 5–15)
BUN: 10 mg/dL (ref 6–23)
CO2: 29 mmol/L (ref 19–32)
Calcium: 9.3 mg/dL (ref 8.4–10.5)
Chloride: 97 mmol/L (ref 96–112)
Creatinine, Ser: 0.98 mg/dL (ref 0.50–1.10)
GFR calc Af Amer: 73 mL/min — ABNORMAL LOW (ref >90)
GFR calc non Af Amer: 63 mL/min — ABNORMAL LOW (ref >90)
Glucose, Bld: 123 mg/dL — ABNORMAL HIGH (ref 70–99)
POTASSIUM: 3.8 mmol/L (ref 3.5–5.1)
SODIUM: 136 mmol/L (ref 135–145)
Total Bilirubin: 1 mg/dL (ref 0.3–1.2)
Total Protein: 8.4 g/dL — ABNORMAL HIGH (ref 6.0–8.3)

## 2015-03-18 LAB — URINALYSIS, ROUTINE W REFLEX MICROSCOPIC
BILIRUBIN URINE: NEGATIVE
GLUCOSE, UA: NEGATIVE mg/dL
HGB URINE DIPSTICK: NEGATIVE
KETONES UR: NEGATIVE mg/dL
Leukocytes, UA: NEGATIVE
NITRITE: NEGATIVE
Protein, ur: NEGATIVE mg/dL
Specific Gravity, Urine: 1.013 (ref 1.005–1.030)
Urobilinogen, UA: 0.2 mg/dL (ref 0.0–1.0)
pH: 5.5 (ref 5.0–8.0)

## 2015-03-18 LAB — LIPASE, BLOOD: LIPASE: 25 U/L (ref 11–59)

## 2015-03-18 MED ORDER — CIPROFLOXACIN HCL 500 MG PO TABS: 500.0000 mg | ORAL_TABLET | Freq: Two times a day (BID) | ORAL | Status: DC

## 2015-03-18 MED ORDER — GI COCKTAIL ~~LOC~~
30.0000 mL | Freq: Once | ORAL | Status: AC
Start: 2015-03-18 — End: 2015-03-18
  Administered 2015-03-18: 30 mL via ORAL
  Filled 2015-03-18: qty 30

## 2015-03-18 MED ORDER — ONDANSETRON 4 MG PO TBDP: ORAL_TABLET | ORAL | Status: DC

## 2015-03-18 MED ORDER — IOHEXOL 300 MG/ML  SOLN
50.0000 mL | Freq: Once | INTRAMUSCULAR | Status: AC | PRN
Start: 2015-03-18 — End: 2015-03-18
  Administered 2015-03-18: 50 mL via ORAL

## 2015-03-18 MED ORDER — ONDANSETRON HCL 4 MG/2ML IJ SOLN
4.0000 mg | Freq: Once | INTRAMUSCULAR | Status: AC
  Administered 2015-03-18: 4 mg via INTRAVENOUS
  Filled 2015-03-18: qty 2

## 2015-03-18 MED ORDER — DICYCLOMINE HCL 20 MG PO TABS: 20.0000 mg | ORAL_TABLET | Freq: Two times a day (BID) | ORAL | Status: DC | PRN

## 2015-03-18 MED ORDER — SODIUM CHLORIDE 0.9 % IV BOLUS (SEPSIS)
1000.0000 mL | Freq: Once | INTRAVENOUS | Status: AC
Start: 2015-03-18 — End: 2015-03-18
  Administered 2015-03-18: 1000 mL via INTRAVENOUS

## 2015-03-18 MED ORDER — IOHEXOL 300 MG/ML  SOLN
100.0000 mL | Freq: Once | INTRAMUSCULAR | Status: AC | PRN
  Administered 2015-03-18: 100 mL via INTRAVENOUS

## 2015-03-18 MED ORDER — CIPROFLOXACIN HCL 500 MG PO TABS
500.0000 mg | ORAL_TABLET | Freq: Once | ORAL | Status: AC
  Administered 2015-03-18: 500 mg via ORAL
  Filled 2015-03-18: qty 1

## 2015-03-18 MED ORDER — FENTANYL CITRATE 0.05 MG/ML IJ SOLN
100.0000 ug | Freq: Once | INTRAMUSCULAR | Status: AC
  Administered 2015-03-18: 100 ug via INTRAVENOUS
  Filled 2015-03-18: qty 2

## 2015-03-18 NOTE — ED Notes (Signed)
Patient transported to CT 

## 2015-03-18 NOTE — ED Provider Notes (Signed)
CSN: [id number]     Arrival date & time 03/17/15  2349 History   First MD Initiated Contact with Patient 03/18/15 0003     Chief Complaint  Patient presents with  . Abdominal Pain     (Consider location/radiation/quality/duration/timing/severity/associated sxs/prior Treatment) HPI Patient presents with acute onset upper abdominal pain starting today around 4 PM after eating baked fish and potatoes. Patient states she's had similar symptoms in the past. She complains of mild nausea but no vomiting. Normal bowel movement earlier today. No urinary symptoms including dysuria, frequency or flank pain. Denies fever or chills. Patient states her primary physician discontinued her antihypertensive medication 4 months ago due to hypotension. She was on low-dose hydrochlorothiazide. Past Medical History  Diagnosis Date  . Acute lymphadenitis 08/13/2009  . Acute pharyngitis 02/10/2010  . ALLERGIC RHINITIS 08/10/2007  . ANXIETY 12/06/2007  . BACK PAIN 12/06/2007  . CHEST PAIN 08/13/2009  . DEPRESSION 12/06/2007  . DIVERTICULOSIS, [place] 12/06/2007  . GERD 12/06/2007  . HYPERTENSION 12/06/2007  . LOW BACK PAIN 12/06/2007  . MRSA 02/07/2009  . Pain in joint, multiple sites 04/21/2010  . PARONYCHIA, FINGER 08/13/2009  . RASH-NONVESICULAR 05/09/2008  . SHOULDER PAIN, LEFT 05/09/2008  . SINUSITIS- ACUTE-NOS 05/09/2008  . THORACIC/LUMBOSACRAL NEURITIS/RADICULITIS UNSPEC 12/28/2008  . Lumbar disc disease 03/09/2012  . Cervical disc disease 03/09/2012  . Chronic pain 03/09/2012  . Impaired glucose tolerance 03/13/2012  . Cellulitis   . Atherosclerotic peripheral vascular disease 06/13/2013    Aorta on CT June 2014   Past Surgical History  Procedure Laterality Date  . Abdominal hysterectomy    . S/p ovary cyst    . S/p right knee arthroscopy      Dr. Mardelle Matte ortho  . Cervical disc surgery     Family History  Problem Relation Age of Onset  . Heart disease Mother   . Hypertension Mother   . Diabetes Mother    . Heart failure Mother   . Asthma Sister   . Asthma Daughter   . Cancer Maternal Uncle     [place]  . Cancer Other     ovarian  . Hypertension Father    History  Substance Use Topics  . Smoking status: Former Smoker -- 30 years    Quit date: 11/08/2008  . Smokeless tobacco: Never Used     Comment: quit 10/09  . Alcohol Use: No   OB History    No data available     Review of Systems  Constitutional: Negative for fever and chills.  Respiratory: Negative for shortness of breath.   Cardiovascular: Negative for chest pain.  Gastrointestinal: Positive for nausea, abdominal pain and abdominal distention. Negative for vomiting, diarrhea and constipation.  Genitourinary: Negative for dysuria, frequency, flank pain and pelvic pain.  Musculoskeletal: Negative for back pain.  Skin: Negative for rash.  Neurological: Negative for dizziness, weakness, light-headedness, numbness and headaches.  All other systems reviewed and are negative.     Allergies  Adhesive; Amoxicillin-pot clavulanate; Codeine; Lactose intolerance (gi); Morphine and related; Vicodin; and Latex  Home Medications   Prior to Admission medications   Medication Sig Start Date End Date Taking? Authorizing Provider  ALPRAZolam Duanne Moron) 0.5 MG tablet take 1 tablet by mouth twice a day if needed for ANXIETY 09/05/14  Yes Biagio Borg, MD  cyclobenzaprine (FLEXERIL) 10 MG tablet Take 10 mg by mouth 2 (two) times daily as needed for muscle spasms.  02/07/15  Yes Historical Provider, MD  fluticasone (FLONASE) 50 MCG/ACT nasal  spray Place 1 spray into the nose daily.  03/07/15  Yes Historical Provider, MD  hydroxypropyl methylcellulose / hypromellose (ISOPTO TEARS / GONIOVISC) 2.5 % ophthalmic solution Place 1 drop into both eyes 3 (three) times daily as needed for dry eyes.   Yes Historical Provider, MD  omeprazole (PRILOSEC OTC) 20 MG tablet Take 20 mg by mouth. 10/17/14 10/17/15 Yes Historical Provider, MD  Oxycodone HCl 10 MG  TABS Take 10 mg by mouth every 6 (six) hours.   Yes Historical Provider, MD  sertraline (ZOLOFT) 50 MG tablet Take 50 mg by mouth daily.   Yes Historical Provider, MD  cephALEXin (KEFLEX) 500 MG capsule Take 1 capsule (500 mg total) by mouth 2 (two) times daily. Patient not taking: Reported on 03/18/2015 09/19/14   Everlene Balls, MD  ciprofloxacin (CIPRO) 500 MG tablet Take 1 tablet (500 mg total) by mouth 2 (two) times daily. One po bid x 5 days 03/18/15   Julianne Rice, MD  dicyclomine (BENTYL) 20 MG tablet Take 1 tablet (20 mg total) by mouth 2 (two) times daily as needed for spasms. 03/18/15   Julianne Rice, MD  levofloxacin (LEVAQUIN) 500 MG tablet Take 1 tablet (500 mg total) by mouth daily. Patient not taking: Reported on 03/18/2015 08/24/14   Silver Huguenin Elgergawy, MD  meloxicam (MOBIC) 7.5 MG tablet Take 1 tablet (7.5 mg total) by mouth daily. Patient not taking: Reported on 03/18/2015 09/02/14   Lutricia Feil, PA  Misc. Devices (ADJUST BATH/SHOWER SEAT) MISC 1 Device by Does not apply route as needed. 08/21/14   Samella Parr, NP  ondansetron (ZOFRAN ODT) 4 MG disintegrating tablet 4mg  ODT q4 hours prn nausea/vomit 03/18/15   Julianne Rice, MD  oxyCODONE (OXY IR/ROXICODONE) 5 MG immediate release tablet Take 1 tablet (5 mg total) by mouth every 6 (six) hours as needed for moderate pain or severe pain. Patient not taking: Reported on 03/18/2015 08/24/14   Silver Huguenin Elgergawy, MD   BP 129/66 mmHg  Pulse 82  Temp(Src) 98.8 F (37.1 C) (Oral)  Resp 22  SpO2 92% Physical Exam  Constitutional: She is oriented to person, place, and time. She appears well-developed and well-nourished. No distress.  Anxious appearing  HENT:  Head: Normocephalic and atraumatic.  Mouth/Throat: Oropharynx is clear and moist.  Eyes: EOM are normal. Pupils are equal, round, and reactive to light.  Neck: Normal range of motion. Neck supple.  Cardiovascular: Normal rate and regular rhythm.   Pulmonary/Chest:  Effort normal and breath sounds normal. No respiratory distress. She has no wheezes. She has no rales.  Abdominal: Soft. Bowel sounds are normal. She exhibits distension. She exhibits no mass. There is tenderness (diffuse tenderness to palpation especially in the epigastric region.). There is no rebound and no guarding.  Musculoskeletal: Normal range of motion. She exhibits no edema or tenderness.  No CVA tenderness bilaterally  Neurological: She is alert and oriented to person, place, and time.  Moves all extremities without deficit. Sensation is grossly intact.  Skin: Skin is warm and dry. No rash noted. No erythema.  Psychiatric: Her behavior is normal.  Nursing note and vitals reviewed.   ED Course  Procedures (including critical care time) Labs Review Labs Reviewed  CBC WITH DIFFERENTIAL/PLATELET - Abnormal; Notable for the following:    WBC 14.6 (*)    Hemoglobin 15.4 (*)    HCT 46.3 (*)    Neutro Abs 10.8 (*)    All other components within normal limits  COMPREHENSIVE METABOLIC  PANEL - Abnormal; Notable for the following:    Glucose, Bld 123 (*)    Total Protein 8.4 (*)    GFR calc non Af Amer 63 (*)    GFR calc Af Amer 73 (*)    All other components within normal limits  URINALYSIS, ROUTINE W REFLEX MICROSCOPIC - Abnormal; Notable for the following:    APPearance CLOUDY (*)    All other components within normal limits  LIPASE, BLOOD    Imaging Review Ct Abdomen Pelvis W Contrast  03/18/2015   CLINICAL DATA:  Epigastric pain and nausea for 10 hours. Upper abdominal pain.  EXAM: CT ABDOMEN AND PELVIS WITH CONTRAST  TECHNIQUE: Multidetector CT imaging of the abdomen and pelvis was performed using the standard protocol following bolus administration of intravenous contrast.  CONTRAST:  111mL OMNIPAQUE IOHEXOL 300 MG/ML SOLN, 36mL OMNIPAQUE IOHEXOL 300 MG/ML SOLN  COMPARISON:  CT 08/17/2013  FINDINGS: Scattered linear atelectasis in both lung bases.  The liver is enlarged  measuring 20 cm in craniocaudal dimension with hepatic steatosis. The gallbladder is physiologically distended. Spleen is normal in size. The pancreas and adrenal glands are normal. The kidneys demonstrate symmetric enhancement and excretion. No hydronephrosis or perinephric stranding. There is a small right renal cyst.  Stomach is physiologically distended with contrast. Proximal small bowel loops are physiologically distended with contrast. There are prominent fluid-filled bowel loops in the right lower quadrant of the abdomen are not dilated. There is adjacent mesenteric edema, mesenteric haziness, and multiple small lymph nodes. There are multiple prominent mesenteric lymph nodes throughout the abdominal mesentery. Small amount of associated free fluid with small amount of free fluid in the pericolic gutters, perihepatic and perisplenic space. Mesenteric vein appears patent. There is no pneumatosis, perforation, or abscess. Fluid distending the normal caliber ascending [place]. Small volume of stool throughout the remainder the [place]. Scattered colonic diverticula in the sigmoid [place] without diverticulitis.  The abdominal aorta is normal in caliber. There are multiple prominent retroperitoneal lymph nodes without pathologic adenopathy.  In the pelvis the urinary bladder is distended. The uterus is surgically absent. No adnexal mass. No significant pelvic free fluid. Prominent lymph node in the left external iliac chain, however retained is normal lymph node morphology. Small bilateral inguinal lymph nodes.  Degenerative disc disease and facet arthropathy in the lower lumbar spine. No acute or suspicious osseous abnormality. Hemi transitional lumbosacral anatomy is seen.  IMPRESSION: 1. Mesenteric inflammation with fluid-filled normal caliber small bowel loops in the right lower quadrant, suggesting of enteritis. There is mild mesenteric edema, mesenteric haziness, and multiple prominent mesenteric lymph nodes.  Small amount of free fluid. No perforation. 2. Hepatomegaly and hepatic steatosis. 3. Splenic diverticulosis without diverticulitis.   Electronically Signed   By: Jeb Levering M.D.   On: 03/18/2015 02:48   Dg Abd Acute W/chest  03/18/2015   CLINICAL DATA:  Upper abdominal pain. Epigastric pain and nausea for 12 hr.  EXAM: ACUTE ABDOMEN SERIES (ABDOMEN 2 VIEW & CHEST 1 VIEW)  COMPARISON:  Chest radiographs 09/19/2014  FINDINGS: The cardiomediastinal contours are normal. Minimal bibasilar atelectasis or scarring, left greater than right. There is no free intra-abdominal air. No dilated bowel loops. There is an air-fluid level in the stomach. Paucity of small bowel gas, a few air-fluid levels in small bowel in the upper abdomen. Small volume of stool throughout the [place]. No radiopaque calculi. No acute osseous abnormalities are seen.  IMPRESSION: Nonspecific bowel gas pattern with air-fluid level in the stomach, paucity  of small bowel gas, and few air-fluid levels in small bowel loops in the upper abdomen. This may reflect enteritis, ileus, or developing small bowel obstruction.   Electronically Signed   By: Jeb Levering M.D.   On: 03/18/2015 00:48     EKG Interpretation None      MDM   Final diagnoses:  Upper abdominal pain  Enteritis  Elevated blood pressure    Patient's abdominal pain has improved. Abdomen is soft. Mild elevation in white blood cell count. CT consistent with enteritis. Given by mouth Cipro in the emergency department we'll discharge home with short course. Patient's blood pressure improved without treatment. She is advised to follow-up with her primary physician and return precautions given.    Julianne Rice, MD 03/18/15 [phone]

## 2015-03-18 NOTE — Discharge Instructions (Signed)

## 2015-04-26 DIAGNOSIS — I872 Venous insufficiency (chronic) (peripheral): Secondary | ICD-10-CM | POA: Insufficient documentation

## 2015-06-30 ENCOUNTER — Emergency Department (HOSPITAL_COMMUNITY): Payer: Medicare Other

## 2015-06-30 ENCOUNTER — Encounter (HOSPITAL_COMMUNITY): Payer: Self-pay | Admitting: Emergency Medicine

## 2015-06-30 ENCOUNTER — Emergency Department (HOSPITAL_COMMUNITY)
Admission: EM | Admit: 2015-06-30 | Discharge: 2015-06-30 | Disposition: A | Discharge status: Home / Self Care | Payer: Medicare Other | Attending: Emergency Medicine | Admitting: Emergency Medicine

## 2015-06-30 DIAGNOSIS — Z872 Personal history of diseases of the skin and subcutaneous tissue: Secondary | ICD-10-CM | POA: Diagnosis not present

## 2015-06-30 DIAGNOSIS — F329 Major depressive disorder, single episode, unspecified: Secondary | ICD-10-CM | POA: Diagnosis not present

## 2015-06-30 DIAGNOSIS — Z7951 Long term (current) use of inhaled steroids: Secondary | ICD-10-CM | POA: Insufficient documentation

## 2015-06-30 DIAGNOSIS — Z880 Allergy status to penicillin: Secondary | ICD-10-CM | POA: Insufficient documentation

## 2015-06-30 DIAGNOSIS — R600 Localized edema: Secondary | ICD-10-CM | POA: Insufficient documentation

## 2015-06-30 DIAGNOSIS — Z91040 Latex allergy status: Secondary | ICD-10-CM | POA: Diagnosis not present

## 2015-06-30 DIAGNOSIS — M79601 Pain in right arm: Secondary | ICD-10-CM | POA: Diagnosis not present

## 2015-06-30 DIAGNOSIS — M542 Cervicalgia: Secondary | ICD-10-CM | POA: Insufficient documentation

## 2015-06-30 DIAGNOSIS — Z8709 Personal history of other diseases of the respiratory system: Secondary | ICD-10-CM | POA: Diagnosis not present

## 2015-06-30 DIAGNOSIS — M79602 Pain in left arm: Secondary | ICD-10-CM | POA: Diagnosis not present

## 2015-06-30 DIAGNOSIS — Z7952 Long term (current) use of systemic steroids: Secondary | ICD-10-CM | POA: Insufficient documentation

## 2015-06-30 DIAGNOSIS — R51 Headache: Secondary | ICD-10-CM | POA: Insufficient documentation

## 2015-06-30 DIAGNOSIS — K219 Gastro-esophageal reflux disease without esophagitis: Secondary | ICD-10-CM | POA: Insufficient documentation

## 2015-06-30 DIAGNOSIS — R509 Fever, unspecified: Secondary | ICD-10-CM

## 2015-06-30 DIAGNOSIS — M549 Dorsalgia, unspecified: Secondary | ICD-10-CM | POA: Diagnosis not present

## 2015-06-30 DIAGNOSIS — M79604 Pain in right leg: Secondary | ICD-10-CM | POA: Diagnosis not present

## 2015-06-30 DIAGNOSIS — Z87891 Personal history of nicotine dependence: Secondary | ICD-10-CM | POA: Diagnosis not present

## 2015-06-30 DIAGNOSIS — Z8614 Personal history of Methicillin resistant Staphylococcus aureus infection: Secondary | ICD-10-CM | POA: Diagnosis not present

## 2015-06-30 DIAGNOSIS — Z791 Long term (current) use of non-steroidal anti-inflammatories (NSAID): Secondary | ICD-10-CM | POA: Insufficient documentation

## 2015-06-30 DIAGNOSIS — F419 Anxiety disorder, unspecified: Secondary | ICD-10-CM | POA: Insufficient documentation

## 2015-06-30 DIAGNOSIS — I10 Essential (primary) hypertension: Secondary | ICD-10-CM | POA: Diagnosis not present

## 2015-06-30 DIAGNOSIS — M79605 Pain in left leg: Secondary | ICD-10-CM | POA: Diagnosis not present

## 2015-06-30 DIAGNOSIS — G8929 Other chronic pain: Secondary | ICD-10-CM | POA: Diagnosis not present

## 2015-06-30 LAB — CBC WITH DIFFERENTIAL/PLATELET
BASOS PCT: 0 % (ref 0–1)
Basophils Absolute: 0 10*3/uL (ref 0.0–0.1)
EOS ABS: 0.1 10*3/uL (ref 0.0–0.7)
EOS PCT: 1 % (ref 0–5)
HCT: 39.1 % (ref 36.0–46.0)
Hemoglobin: 12.8 g/dL (ref 12.0–15.0)
Lymphocytes Relative: 11 % — ABNORMAL LOW (ref 12–46)
Lymphs Abs: 1.7 10*3/uL (ref 0.7–4.0)
MCH: 29.9 pg (ref 26.0–34.0)
MCHC: 32.7 g/dL (ref 30.0–36.0)
MCV: 91.4 fL (ref 78.0–100.0)
MONO ABS: 1 10*3/uL (ref 0.1–1.0)
MONOS PCT: 6 % (ref 3–12)
Neutro Abs: 13 10*3/uL — ABNORMAL HIGH (ref 1.7–7.7)
Neutrophils Relative %: 82 % — ABNORMAL HIGH (ref 43–77)
Platelets: 218 10*3/uL (ref 150–400)
RBC: 4.28 MIL/uL (ref 3.87–5.11)
RDW: 13.1 % (ref 11.5–15.5)
WBC: 15.8 10*3/uL — AB (ref 4.0–10.5)

## 2015-06-30 LAB — CBC
HCT: 35.5 % — ABNORMAL LOW (ref 36.0–46.0)
HEMOGLOBIN: 11.6 g/dL — AB (ref 12.0–15.0)
MCH: 29.9 pg (ref 26.0–34.0)
MCHC: 32.7 g/dL (ref 30.0–36.0)
MCV: 91.5 fL (ref 78.0–100.0)
Platelets: 219 10*3/uL (ref 150–400)
RBC: 3.88 MIL/uL (ref 3.87–5.11)
RDW: 13.2 % (ref 11.5–15.5)
WBC: 13.8 10*3/uL — ABNORMAL HIGH (ref 4.0–10.5)

## 2015-06-30 LAB — COMPREHENSIVE METABOLIC PANEL
ALT: 23 U/L (ref 14–54)
AST: 23 U/L (ref 15–41)
Albumin: 3.9 g/dL (ref 3.5–5.0)
Alkaline Phosphatase: 69 U/L (ref 38–126)
Anion gap: 8 (ref 5–15)
BUN: 17 mg/dL (ref 6–20)
CO2: 28 mmol/L (ref 22–32)
CREATININE: 1.04 mg/dL — AB (ref 0.44–1.00)
Calcium: 8.6 mg/dL — ABNORMAL LOW (ref 8.9–10.3)
Chloride: 99 mmol/L — ABNORMAL LOW (ref 101–111)
GFR, EST NON AFRICAN AMERICAN: 58 mL/min — AB (ref >60)
GLUCOSE: 127 mg/dL — AB (ref 65–99)
Potassium: 3.9 mmol/L (ref 3.5–5.1)
Sodium: 135 mmol/L (ref 135–145)
Total Bilirubin: 1 mg/dL (ref 0.3–1.2)
Total Protein: 7.2 g/dL (ref 6.5–8.1)

## 2015-06-30 LAB — URINALYSIS, ROUTINE W REFLEX MICROSCOPIC
BILIRUBIN URINE: NEGATIVE
Glucose, UA: NEGATIVE mg/dL
HGB URINE DIPSTICK: NEGATIVE
KETONES UR: NEGATIVE mg/dL
Leukocytes, UA: NEGATIVE
NITRITE: NEGATIVE
PROTEIN: NEGATIVE mg/dL
Specific Gravity, Urine: 1.014 (ref 1.005–1.030)
UROBILINOGEN UA: 0.2 mg/dL (ref 0.0–1.0)
pH: 7 (ref 5.0–8.0)

## 2015-06-30 LAB — I-STAT CG4 LACTIC ACID, ED
Lactic Acid, Venous: 1.3 mmol/L (ref 0.5–2.0)
Lactic Acid, Venous: 1.19 mmol/L (ref 0.5–2.0)

## 2015-06-30 MED ORDER — SODIUM CHLORIDE 0.9 % IV BOLUS (SEPSIS)
500.0000 mL | INTRAVENOUS | Status: AC
  Administered 2015-06-30: 500 mL via INTRAVENOUS

## 2015-06-30 MED ORDER — IBUPROFEN 800 MG PO TABS
800.0000 mg | ORAL_TABLET | Freq: Once | ORAL | Status: AC
  Administered 2015-06-30: 800 mg via ORAL
  Filled 2015-06-30: qty 1

## 2015-06-30 MED ORDER — PIPERACILLIN-TAZOBACTAM 3.375 G IVPB 30 MIN
3.3750 g | Freq: Once | INTRAVENOUS | Status: AC
  Administered 2015-06-30: 3.375 g via INTRAVENOUS
  Filled 2015-06-30: qty 50

## 2015-06-30 MED ORDER — ONDANSETRON 4 MG PO TBDP: 4.0000 mg | ORAL_TABLET | Freq: Three times a day (TID) | ORAL | Status: DC | PRN

## 2015-06-30 MED ORDER — SODIUM CHLORIDE 0.9 % IV BOLUS (SEPSIS)
1000.0000 mL | INTRAVENOUS | Status: AC
  Administered 2015-06-30 (×4): 1000 mL via INTRAVENOUS

## 2015-06-30 MED ORDER — NAPROXEN 500 MG PO TABS: 500.0000 mg | ORAL_TABLET | Freq: Two times a day (BID) | ORAL | Status: DC

## 2015-06-30 MED ORDER — FENTANYL CITRATE (PF) 100 MCG/2ML IJ SOLN
50.0000 ug | Freq: Once | INTRAMUSCULAR | Status: AC
  Administered 2015-06-30: 50 ug via INTRAVENOUS
  Filled 2015-06-30: qty 2

## 2015-06-30 MED ORDER — KETOROLAC TROMETHAMINE 30 MG/ML IJ SOLN
15.0000 mg | Freq: Once | INTRAMUSCULAR | Status: AC
  Administered 2015-06-30: 15 mg via INTRAVENOUS
  Filled 2015-06-30: qty 1

## 2015-06-30 MED ORDER — VANCOMYCIN HCL 10 G IV SOLR
2500.0000 mg | INTRAVENOUS | Status: AC
  Administered 2015-06-30: 2500 mg via INTRAVENOUS
  Filled 2015-06-30: qty 2500

## 2015-06-30 MED ORDER — VANCOMYCIN HCL 10 G IV SOLR
1250.0000 mg | Freq: Two times a day (BID) | INTRAVENOUS | Status: DC
  Filled 2015-06-30: qty 1250

## 2015-06-30 MED ORDER — VANCOMYCIN HCL IN DEXTROSE 1-5 GM/200ML-% IV SOLN: 1000.0000 mg | Freq: Once | INTRAVENOUS | Status: DC

## 2015-06-30 MED ORDER — PIPERACILLIN-TAZOBACTAM 3.375 G IVPB: 3.3750 g | Freq: Three times a day (TID) | INTRAVENOUS | Status: DC

## 2015-06-30 MED ORDER — CEPHALEXIN 500 MG PO CAPS: 500.0000 mg | ORAL_CAPSULE | Freq: Four times a day (QID) | ORAL | Status: DC

## 2015-06-30 NOTE — Discharge Instructions (Signed)
Please call your doctor for a followup appointment within 24-48 hours. When you talk to your doctor please let them know that you were seen in the emergency department and have them acquire all of your records so that they can discuss the findings with you and formulate a treatment plan to fully care for your new and ongoing problems. ° °

## 2015-06-30 NOTE — ED Notes (Addendum)
1L NS infused

## 2015-06-30 NOTE — ED Notes (Addendum)
Pt c/o fever and generalized body aches and burning eyes when she woke up this morning at 0530, pt states she took a cold shower and then went back to sleep, pt states that she woke up the second time at 0730 pt states that fever had increased. Pt c/o heat and redness to legs bilaterally, pt reports Hx of multiple episodes of cellulitis.

## 2015-06-30 NOTE — ED Provider Notes (Signed)
CSN: [id number]     Arrival date & time 06/30/15  1157 History   First MD Initiated Contact with Patient 06/30/15 0901     Chief Complaint  Patient presents with  . Fever  . Generalized Body Aches     (Consider location/radiation/quality/duration/timing/severity/associated sxs/prior Treatment) HPI Comments: The patient is a very obese 59 year old female who presents with a complaint of a fever, headache, neck pain, myalgias. Yesterday she had excessive fatigue, this morning she woke up feeling very hot and flushed, took a shower but still felt as though she had a fever, had ongoing feeling of body aches including pain in her arms, legs, back, neck and headache. She denies changes in her vision, there is no weakness or numbness.  She denies coughing, dysuria, diarrhea, rashes, swelling, sore throat, eye drainage or any other complaints. Her symptoms are persistent, nothing makes this better or worse, no anti-pyretics given prior to arrival.  Patient is a 59 y.o. female presenting with fever. The history is provided by the patient.  Fever   Past Medical History  Diagnosis Date  . Acute lymphadenitis 08/13/2009  . Acute pharyngitis 02/10/2010  . ALLERGIC RHINITIS 08/10/2007  . ANXIETY 12/06/2007  . BACK PAIN 12/06/2007  . CHEST PAIN 08/13/2009  . DEPRESSION 12/06/2007  . DIVERTICULOSIS, [place] 12/06/2007  . GERD 12/06/2007  . HYPERTENSION 12/06/2007  . LOW BACK PAIN 12/06/2007  . MRSA 02/07/2009  . Pain in joint, multiple sites 04/21/2010  . PARONYCHIA, FINGER 08/13/2009  . RASH-NONVESICULAR 05/09/2008  . SHOULDER PAIN, LEFT 05/09/2008  . SINUSITIS- ACUTE-NOS 05/09/2008  . THORACIC/LUMBOSACRAL NEURITIS/RADICULITIS UNSPEC 12/28/2008  . Lumbar disc disease 03/09/2012  . Cervical disc disease 03/09/2012  . Chronic pain 03/09/2012  . Impaired glucose tolerance 03/13/2012  . Cellulitis   . Atherosclerotic peripheral vascular disease 06/13/2013    Aorta on CT June 2014   Past Surgical History   Procedure Laterality Date  . Abdominal hysterectomy    . S/p ovary cyst    . S/p right knee arthroscopy      Dr. Mardelle Matte ortho  . Cervical disc surgery     Family History  Problem Relation Age of Onset  . Heart disease Mother   . Hypertension Mother   . Diabetes Mother   . Heart failure Mother   . Asthma Sister   . Asthma Daughter   . Cancer Maternal Uncle     [place]  . Cancer Other     ovarian  . Hypertension Father    History  Substance Use Topics  . Smoking status: Former Smoker -- 30 years    Quit date: 11/08/2008  . Smokeless tobacco: Never Used     Comment: quit 10/09  . Alcohol Use: No   OB History    No data available     Review of Systems  Constitutional: Positive for fever.  All other systems reviewed and are negative.     Allergies  Adhesive; Amoxicillin-pot clavulanate; Codeine; Lactose intolerance (gi); Morphine and related; Vicodin; and Latex  Home Medications   Prior to Admission medications   Medication Sig Start Date End Date Taking? Authorizing Provider  ALPRAZolam Duanne Moron) 0.5 MG tablet take 1 tablet by mouth twice a day if needed for ANXIETY 09/05/14  Yes Biagio Borg, MD  cyclobenzaprine (FLEXERIL) 10 MG tablet Take 10 mg by mouth 2 (two) times daily as needed for muscle spasms.  02/07/15  Yes Historical Provider, MD  dicyclomine (BENTYL) 20 MG tablet Take 1 tablet (20 mg  total) by mouth 2 (two) times daily as needed for spasms. 03/18/15  Yes Julianne Rice, MD  fluticasone (FLONASE) 50 MCG/ACT nasal spray Place 1 spray into the nose daily as needed for allergies.  03/07/15  Yes Historical Provider, MD  furosemide (LASIX) 20 MG tablet Take 20 mg by mouth every morning.  06/03/15  Yes Historical Provider, MD  hydroxypropyl methylcellulose / hypromellose (ISOPTO TEARS / GONIOVISC) 2.5 % ophthalmic solution Place 1 drop into both eyes 3 (three) times daily as needed for dry eyes.   Yes Historical Provider, MD  omeprazole (PRILOSEC OTC) 20 MG tablet  Take 20 mg by mouth daily as needed (FOR ACID REFLUX).  10/17/14 10/17/15 Yes Historical Provider, MD  sertraline (ZOLOFT) 50 MG tablet Take 50 mg by mouth every morning.    Yes Historical Provider, MD  triamcinolone cream (KENALOG) 0.1 % Apply 1 application topically 2 (two) times daily.  06/03/15  Yes Historical Provider, MD  cephALEXin (KEFLEX) 500 MG capsule Take 1 capsule (500 mg total) by mouth 4 (four) times daily. 06/30/15   Noemi Chapel, MD  Misc. Devices (ADJUST BATH/SHOWER SEAT) MISC 1 Device by Does not apply route as needed. 08/21/14   Samella Parr, NP  naproxen (NAPROSYN) 500 MG tablet Take 1 tablet (500 mg total) by mouth 2 (two) times daily with a meal. 06/30/15   Noemi Chapel, MD  ondansetron (ZOFRAN ODT) 4 MG disintegrating tablet Take 1 tablet (4 mg total) by mouth every 8 (eight) hours as needed for nausea. 06/30/15   Noemi Chapel, MD   BP 118/69 mmHg  Pulse 87  Temp(Src) 98 F (36.7 C) (Oral)  Resp 17  Ht 5' 4.5" (1.638 m)  Wt 318 lb (144.244 kg)  BMI 53.76 kg/m2  SpO2 96% Physical Exam  Constitutional: She appears well-developed and well-nourished. No distress.  HENT:  Head: Normocephalic and atraumatic.  Mouth/Throat: Oropharynx is clear and moist. No oropharyngeal exudate.  Eyes: Conjunctivae and EOM are normal. Pupils are equal, round, and reactive to light. Right eye exhibits no discharge. Left eye exhibits no discharge. No scleral icterus.  Neck: Normal range of motion. Neck supple. No JVD present. No thyromegaly present.  Very supple neck with no lymphadenopathy  Cardiovascular: Regular rhythm, normal heart sounds and intact distal pulses.  Exam reveals no gallop and no friction rub.   No murmur heard. Tachycardic to 115 bpm  Pulmonary/Chest: Effort normal and breath sounds normal. No respiratory distress. She has no wheezes. She has no rales.  Abdominal: Soft. Bowel sounds are normal. She exhibits no distension and no mass. There is no tenderness.   Musculoskeletal: Normal range of motion. She exhibits edema (mild symmetrical pitting edema) and tenderness (mild tenderness to palpation in major muscle groups including back, neck, arms and legs).  Lymphadenopathy:    She has no cervical adenopathy.  Neurological: She is alert. Coordination normal.  Clear speech, moves all extremities 4 without difficulty, normal strength, normal coordination  Skin: Skin is warm and dry. No rash noted. No erythema.  Psychiatric: She has a normal mood and affect. Her behavior is normal.  Nursing note and vitals reviewed.   ED Course  Procedures (including critical care time) Labs Review Labs Reviewed  COMPREHENSIVE METABOLIC PANEL - Abnormal; Notable for the following:    Chloride 99 (*)    Glucose, Bld 127 (*)    Creatinine, Ser 1.04 (*)    Calcium 8.6 (*)    GFR calc non Af Amer 58 (*)  All other components within normal limits  CBC WITH DIFFERENTIAL/PLATELET - Abnormal; Notable for the following:    WBC 15.8 (*)    Neutrophils Relative % 82 (*)    Neutro Abs 13.0 (*)    Lymphocytes Relative 11 (*)    All other components within normal limits  CBC - Abnormal; Notable for the following:    WBC 13.8 (*)    Hemoglobin 11.6 (*)    HCT 35.5 (*)    All other components within normal limits  CULTURE, BLOOD (ROUTINE X 2)  CULTURE, BLOOD (ROUTINE X 2)  URINE CULTURE  URINALYSIS, ROUTINE W REFLEX MICROSCOPIC (NOT AT [facility])  INFLUENZA PANEL BY PCR (TYPE A & B, H1N1)(NOT AT [facility])  I-STAT CG4 LACTIC ACID, ED  I-STAT CG4 LACTIC ACID, ED    Imaging Review Dg Chest 2 View  06/30/2015   CLINICAL DATA:  Patient with fever and cough.  EXAM: CHEST  2 VIEW  COMPARISON:  Chest radiograph 03/18/2015  FINDINGS: Monitoring leads overlie the patient. Anterior cervical spinal fusion hardware. Stable enlarged cardiac and mediastinal contours. No consolidative pulmonary opacities. No pleural effusion or pneumothorax. Mid thoracic spine degenerative changes.   IMPRESSION: Cardiomegaly.  Minimal dependent atelectasis.   Electronically Signed   By: Lovey Newcomer M.D.   On: 06/30/2015 11:23     MDM   Final diagnoses:  Fever    The patient has tachycardia, fever, this is consistent with a picture of systemic inflammatory response syndrome, no obvious source is present on exam, we'll check a chest x-ray, urinalysis, lactic acid, labs, fluid resuscitation, and to paramedics, at about X. The patient is in agreement with the plan. She does have a history of severe sepsis after developing an occult pneumonia.  The pt has improved significantly after getting fluids and the fever defervescing - no longer has SIRS criteria - she has improved lactic acid and WBC and no tachycarida or fever, UA and CXR without acute fidnings - I discussed with patient at length the indications for admission versus discharge, she requests discharge, she is happy with the plan, will prescribe and at about it for home however this is very nonspecific febrile presentation, she is aware and is exposed to understanding to the indications for return.  Meds given in ED:  Medications  piperacillin-tazobactam (ZOSYN) IVPB 3.375 g (not administered)  vancomycin (VANCOCIN) 1,250 mg in sodium chloride 0.9 % 250 mL IVPB (not administered)  sodium chloride 0.9 % bolus 1,000 mL (1,000 mLs Intravenous New Bag/Given 06/30/15 1145)    And  sodium chloride 0.9 % bolus 500 mL (0 mLs Intravenous Stopped 06/30/15 1144)  piperacillin-tazobactam (ZOSYN) IVPB 3.375 g (0 g Intravenous Stopped 06/30/15 1105)  ibuprofen (ADVIL,MOTRIN) tablet 800 mg (800 mg Oral Given 06/30/15 1019)  vancomycin (VANCOCIN) 2,500 mg in sodium chloride 0.9 % 500 mL IVPB (0 mg Intravenous Stopped 06/30/15 1217)  ketorolac (TORADOL) 30 MG/ML injection 15 mg (15 mg Intravenous Given 06/30/15 1031)  fentaNYL (SUBLIMAZE) injection 50 mcg (50 mcg Intravenous Given 06/30/15 1308)    New Prescriptions   CEPHALEXIN (KEFLEX) 500 MG CAPSULE     Take 1 capsule (500 mg total) by mouth 4 (four) times daily.   NAPROXEN (NAPROSYN) 500 MG TABLET    Take 1 tablet (500 mg total) by mouth 2 (two) times daily with a meal.   ONDANSETRON (ZOFRAN ODT) 4 MG DISINTEGRATING TABLET    Take 1 tablet (4 mg total) by mouth every 8 (eight) hours as needed for  nausea.      Noemi Chapel, MD 06/30/15 [phone]

## 2015-06-30 NOTE — ED Notes (Addendum)
2nd 1 L NS infused, 573ml NS infused, total infused so far 2544ml NS

## 2015-06-30 NOTE — ED Notes (Signed)
Pt to xray

## 2015-06-30 NOTE — ED Notes (Signed)
zozyn abx paused, line flushed, toradol given, line flushed, abx restarted

## 2015-06-30 NOTE — Progress Notes (Signed)
ANTIBIOTIC CONSULT NOTE - INITIAL  Pharmacy Consult for Vancomycin, Zosyn Indication: rule out sepsis  Allergies  Allergen Reactions  . Adhesive [Tape] Other (See Comments)    Tears skin off - use paper tape   . Amoxicillin-Pot Clavulanate Nausea And Vomiting  . Codeine Other (See Comments)    hallucinations  . Lactose Intolerance (Gi)     Unknown reaction   . Morphine And Related Nausea And Vomiting and Other (See Comments)    headaches  . Vicodin [Hydrocodone-Acetaminophen] Other (See Comments)    hallucinations  . Latex Rash    Patient Measurements: Height: 5' 4.5" (163.8 cm) Weight: (!) 318 lb (144.244 kg) IBW/kg (Calculated) : 55.85  Vital Signs: Temp: 102.3 F (39.1 C) (07/10 0908) Temp Source: Oral (07/10 0908) BP: 160/63 mmHg (07/10 0917) Pulse Rate: 111 (07/10 0917) Intake/Output from previous day:   Intake/Output from this shift:    Labs: No results for input(s): WBC, HGB, PLT, LABCREA, CREATININE in the last 72 hours. CrCl cannot be calculated (Patient has no serum creatinine result on file.). No results for input(s): VANCOTROUGH, VANCOPEAK, VANCORANDOM, GENTTROUGH, GENTPEAK, GENTRANDOM, TOBRATROUGH, TOBRAPEAK, TOBRARND, AMIKACINPEAK, AMIKACINTROU, AMIKACIN in the last 72 hours.   Microbiology: No results found for this or any previous visit (from the past 720 hour(s)).  Medical History: Past Medical History  Diagnosis Date  . Acute lymphadenitis 08/13/2009  . Acute pharyngitis 02/10/2010  . ALLERGIC RHINITIS 08/10/2007  . ANXIETY 12/06/2007  . BACK PAIN 12/06/2007  . CHEST PAIN 08/13/2009  . DEPRESSION 12/06/2007  . DIVERTICULOSIS, [place] 12/06/2007  . GERD 12/06/2007  . HYPERTENSION 12/06/2007  . LOW BACK PAIN 12/06/2007  . MRSA 02/07/2009  . Pain in joint, multiple sites 04/21/2010  . PARONYCHIA, FINGER 08/13/2009  . RASH-NONVESICULAR 05/09/2008  . SHOULDER PAIN, LEFT 05/09/2008  . SINUSITIS- ACUTE-NOS 05/09/2008  . THORACIC/LUMBOSACRAL  NEURITIS/RADICULITIS UNSPEC 12/28/2008  . Lumbar disc disease 03/09/2012  . Cervical disc disease 03/09/2012  . Chronic pain 03/09/2012  . Impaired glucose tolerance 03/13/2012  . Cellulitis   . Atherosclerotic peripheral vascular disease 06/13/2013    Aorta on CT June 2014    Assessment: 59 y/o obese female with PMH of anxiety, depression, HTN, GERD, chronic pain, cellulitis who presents with complaints of fever, headache, neck pain, and myalgias. Patient also noted to be tachycardic. Code sepsis initiated, and pharmacy consulted to assist with dosing of Vancomycin and Zosyn.  7/10 >> Vancomycin >> 7/10 >> Zosyn >>    7/10 blood x 2: sent 7/10 urine: sent   Temp: 102.76F WBC: elevated at 15.8K Renal: SCr 1.04, CrCl ~ 67 N, 85 CG  Goal of Therapy:  Vancomycin trough level 15-20 mcg/ml  Appropriate antibiotic dosing for renal function and indication Eradication of infection  Plan:   Vancomycin 2500 mg IV x 1 STAT, then 1250 mg IV q12h.  Plan for Vancomycin trough level at steady state.  Zosyn 3.375g IV x 1 over 30 min STAT, then Zosyn 3.375g IV q8h (infuse over 4 hours).  Monitor renal function, cultures, clinical course.    Lindell Spar, PharmD, BCPS Pager: [phone] 06/30/2015 09:28 AM

## 2015-06-30 NOTE — ED Notes (Signed)
1 L NS infused, total 3547ml NS now

## 2015-07-01 LAB — URINE CULTURE: Culture: NO GROWTH

## 2015-07-05 LAB — CULTURE, BLOOD (ROUTINE X 2)
CULTURE: NO GROWTH
CULTURE: NO GROWTH

## 2015-07-09 ENCOUNTER — Other Ambulatory Visit (HOSPITAL_COMMUNITY): Payer: Self-pay | Admitting: Family Medicine

## 2015-07-09 DIAGNOSIS — I872 Venous insufficiency (chronic) (peripheral): Secondary | ICD-10-CM

## 2015-07-09 DIAGNOSIS — R509 Fever, unspecified: Secondary | ICD-10-CM

## 2015-07-15 ENCOUNTER — Ambulatory Visit (HOSPITAL_COMMUNITY)
Admission: RE | Admit: 2015-07-15 | Discharge: 2015-07-15 | Disposition: A | Discharge status: Home / Self Care | Payer: Medicare Other | Source: Ambulatory Visit | Attending: Internal Medicine | Admitting: Internal Medicine

## 2015-07-15 DIAGNOSIS — I872 Venous insufficiency (chronic) (peripheral): Secondary | ICD-10-CM

## 2015-07-15 DIAGNOSIS — Z8249 Family history of ischemic heart disease and other diseases of the circulatory system: Secondary | ICD-10-CM | POA: Insufficient documentation

## 2015-07-15 DIAGNOSIS — I8312 Varicose veins of left lower extremity with inflammation: Secondary | ICD-10-CM

## 2015-07-15 DIAGNOSIS — R509 Fever, unspecified: Secondary | ICD-10-CM | POA: Diagnosis not present

## 2015-07-15 DIAGNOSIS — I517 Cardiomegaly: Secondary | ICD-10-CM | POA: Diagnosis not present

## 2015-07-15 DIAGNOSIS — I8311 Varicose veins of right lower extremity with inflammation: Secondary | ICD-10-CM

## 2015-10-08 DIAGNOSIS — G2581 Restless legs syndrome: Secondary | ICD-10-CM | POA: Insufficient documentation

## 2015-11-06 ENCOUNTER — Encounter: Payer: Self-pay | Admitting: Gastroenterology

## 2015-11-09 ENCOUNTER — Emergency Department (HOSPITAL_COMMUNITY)
Admission: EM | Admit: 2015-11-09 | Discharge: 2015-11-09 | Disposition: A | Discharge status: Home / Self Care | Payer: Medicare Other | Attending: Emergency Medicine | Admitting: Emergency Medicine

## 2015-11-09 ENCOUNTER — Encounter (HOSPITAL_COMMUNITY): Payer: Self-pay | Admitting: Emergency Medicine

## 2015-11-09 ENCOUNTER — Emergency Department (HOSPITAL_COMMUNITY): Payer: Medicare Other

## 2015-11-09 DIAGNOSIS — Z8709 Personal history of other diseases of the respiratory system: Secondary | ICD-10-CM | POA: Insufficient documentation

## 2015-11-09 DIAGNOSIS — T464X5A Adverse effect of angiotensin-converting-enzyme inhibitors, initial encounter: Secondary | ICD-10-CM

## 2015-11-09 DIAGNOSIS — Z792 Long term (current) use of antibiotics: Secondary | ICD-10-CM | POA: Insufficient documentation

## 2015-11-09 DIAGNOSIS — F329 Major depressive disorder, single episode, unspecified: Secondary | ICD-10-CM | POA: Diagnosis not present

## 2015-11-09 DIAGNOSIS — Z791 Long term (current) use of non-steroidal anti-inflammatories (NSAID): Secondary | ICD-10-CM | POA: Diagnosis not present

## 2015-11-09 DIAGNOSIS — F419 Anxiety disorder, unspecified: Secondary | ICD-10-CM | POA: Insufficient documentation

## 2015-11-09 DIAGNOSIS — Z8614 Personal history of Methicillin resistant Staphylococcus aureus infection: Secondary | ICD-10-CM | POA: Diagnosis not present

## 2015-11-09 DIAGNOSIS — K219 Gastro-esophageal reflux disease without esophagitis: Secondary | ICD-10-CM | POA: Diagnosis not present

## 2015-11-09 DIAGNOSIS — R079 Chest pain, unspecified: Secondary | ICD-10-CM | POA: Insufficient documentation

## 2015-11-09 DIAGNOSIS — Z91040 Latex allergy status: Secondary | ICD-10-CM | POA: Diagnosis not present

## 2015-11-09 DIAGNOSIS — G8929 Other chronic pain: Secondary | ICD-10-CM | POA: Insufficient documentation

## 2015-11-09 DIAGNOSIS — R05 Cough: Secondary | ICD-10-CM | POA: Diagnosis not present

## 2015-11-09 DIAGNOSIS — R058 Other specified cough: Secondary | ICD-10-CM

## 2015-11-09 DIAGNOSIS — Z880 Allergy status to penicillin: Secondary | ICD-10-CM | POA: Diagnosis not present

## 2015-11-09 DIAGNOSIS — Z87891 Personal history of nicotine dependence: Secondary | ICD-10-CM | POA: Insufficient documentation

## 2015-11-09 DIAGNOSIS — Z872 Personal history of diseases of the skin and subcutaneous tissue: Secondary | ICD-10-CM | POA: Diagnosis not present

## 2015-11-09 DIAGNOSIS — Z79899 Other long term (current) drug therapy: Secondary | ICD-10-CM | POA: Diagnosis not present

## 2015-11-09 DIAGNOSIS — I10 Essential (primary) hypertension: Secondary | ICD-10-CM | POA: Insufficient documentation

## 2015-11-09 MED ORDER — BENZONATATE 100 MG PO CAPS: 100.0000 mg | ORAL_CAPSULE | Freq: Three times a day (TID) | ORAL | Status: DC

## 2015-11-09 NOTE — Discharge Instructions (Signed)
You have been seen today for a dry cough. It is suspected that her cough is from the lisinopril. It is recommended that you stop taking lisinopril and begin taking your HCTZ again as directed until you can see your PCP. Follow up with PCP as soon as possible. Return to ED should symptoms worsen.

## 2015-11-09 NOTE — ED Notes (Signed)
Pt has been on antibiotics since Tuesday for a sinus infection with a cough. Pt states the cough is getting worse, and making her anxious because she is unable to sleep. States her chest is hurting all over from coughing so much, inspiratory and expiratory wheezes auscultated in all fields.

## 2015-11-09 NOTE — ED Notes (Signed)
MD at bedside. 

## 2015-11-09 NOTE — ED Provider Notes (Signed)
CSN: [id number]     Arrival date & time 11/09/15  1808 History   First MD Initiated Contact with Patient 11/09/15 1927     Chief Complaint  Patient presents with  . Cough  . Chest Pain     (Consider location/radiation/quality/duration/timing/severity/associated sxs/prior Treatment) HPI   Maria [name] is a 59 y.o. female, with a history of GERD, presenting to the ED with a dry, non-productive cough that began two days ago. Pt states the cough has worsened as time goes on. Pt also mentions that she was put on lisinopril, which replaced her HCTZ, four days ago. Pt states she in the middle of an antibiotic cycle for a sinus infection and has two days left. Pt has tried tessalon pearls and Tussin, with minimal improvement. Pt denies fever/chills, shortness of breath, chest pain, N/V, or any other complaints.     Past Medical History  Diagnosis Date  . Acute lymphadenitis 08/13/2009  . Acute pharyngitis 02/10/2010  . ALLERGIC RHINITIS 08/10/2007  . ANXIETY 12/06/2007  . BACK PAIN 12/06/2007  . CHEST PAIN 08/13/2009  . DEPRESSION 12/06/2007  . DIVERTICULOSIS, [place] 12/06/2007  . GERD 12/06/2007  . HYPERTENSION 12/06/2007  . LOW BACK PAIN 12/06/2007  . MRSA 02/07/2009  . Pain in joint, multiple sites 04/21/2010  . PARONYCHIA, FINGER 08/13/2009  . RASH-NONVESICULAR 05/09/2008  . SHOULDER PAIN, LEFT 05/09/2008  . SINUSITIS- ACUTE-NOS 05/09/2008  . THORACIC/LUMBOSACRAL NEURITIS/RADICULITIS UNSPEC 12/28/2008  . Lumbar disc disease 03/09/2012  . Cervical disc disease 03/09/2012  . Chronic pain 03/09/2012  . Impaired glucose tolerance 03/13/2012  . Cellulitis   . Atherosclerotic peripheral vascular disease ([place]) 06/13/2013    Aorta on CT June 2014   Past Surgical History  Procedure Laterality Date  . Abdominal hysterectomy    . S/p ovary cyst    . S/p right knee arthroscopy      Dr. Mardelle Matte ortho  . Cervical disc surgery     Family History  Problem Relation Age of Onset  . Heart disease  Mother   . Hypertension Mother   . Diabetes Mother   . Heart failure Mother   . Asthma Sister   . Asthma Daughter   . Cancer Maternal Uncle     [place]  . Cancer Other     ovarian  . Hypertension Father    Social History  Substance Use Topics  . Smoking status: Former Smoker -- 30 years    Quit date: 11/08/2008  . Smokeless tobacco: Never Used     Comment: quit 10/09  . Alcohol Use: No   OB History    No data available     Review of Systems  Respiratory: Positive for cough. Negative for shortness of breath.   Cardiovascular: Negative for chest pain.  Gastrointestinal: Negative for nausea and vomiting.  All other systems reviewed and are negative.     Allergies  Adhesive; Amoxicillin-pot clavulanate; Codeine; Lactose intolerance (gi); Morphine and related; Vicodin; and Latex  Home Medications   Prior to Admission medications   Medication Sig Start Date End Date Taking? Authorizing Provider  ALPRAZolam Duanne Moron) 0.5 MG tablet take 1 tablet by mouth twice a day if needed for ANXIETY 09/05/14   Biagio Borg, MD  cephALEXin (KEFLEX) 500 MG capsule Take 1 capsule (500 mg total) by mouth 4 (four) times daily. 06/30/15   Noemi Chapel, MD  cyclobenzaprine (FLEXERIL) 10 MG tablet Take 10 mg by mouth 2 (two) times daily as needed for muscle spasms.  02/07/15  Historical Provider, MD  dicyclomine (BENTYL) 20 MG tablet Take 1 tablet (20 mg total) by mouth 2 (two) times daily as needed for spasms. 03/18/15   Julianne Rice, MD  fluticasone (FLONASE) 50 MCG/ACT nasal spray Place 1 spray into the nose daily as needed for allergies.  03/07/15   Historical Provider, MD  furosemide (LASIX) 20 MG tablet Take 20 mg by mouth every morning.  06/03/15   Historical Provider, MD  hydroxypropyl methylcellulose / hypromellose (ISOPTO TEARS / GONIOVISC) 2.5 % ophthalmic solution Place 1 drop into both eyes 3 (three) times daily as needed for dry eyes.    Historical Provider, MD  Misc. Devices (ADJUST  BATH/SHOWER SEAT) MISC 1 Device by Does not apply route as needed. 08/21/14   Samella Parr, NP  naproxen (NAPROSYN) 500 MG tablet Take 1 tablet (500 mg total) by mouth 2 (two) times daily with a meal. 06/30/15   Noemi Chapel, MD  omeprazole (PRILOSEC OTC) 20 MG tablet Take 20 mg by mouth daily as needed (FOR ACID REFLUX).  10/17/14 10/17/15  Historical Provider, MD  ondansetron (ZOFRAN ODT) 4 MG disintegrating tablet Take 1 tablet (4 mg total) by mouth every 8 (eight) hours as needed for nausea. 06/30/15   Noemi Chapel, MD  sertraline (ZOLOFT) 50 MG tablet Take 50 mg by mouth every morning.     Historical Provider, MD  triamcinolone cream (KENALOG) 0.1 % Apply 1 application topically 2 (two) times daily.  06/03/15   Historical Provider, MD   BP 149/72 mmHg  Pulse 93  Temp(Src) 98.3 F (36.8 C) (Oral)  Resp 18  SpO2 99% Physical Exam  Constitutional: She appears well-developed and well-nourished. No distress.  HENT:  Head: Normocephalic and atraumatic.  Nose: Nose normal.  Mouth/Throat: Oropharynx is clear and moist.  Eyes: Conjunctivae are normal. Pupils are equal, round, and reactive to light.  Neck: Normal range of motion. Neck supple.  Cardiovascular: Normal rate, regular rhythm and normal heart sounds.   Pulmonary/Chest: Effort normal and breath sounds normal. No tachypnea. No respiratory distress.  Abdominal: Soft. Bowel sounds are normal.  Musculoskeletal: She exhibits no edema or tenderness.  Lymphadenopathy:    She has no cervical adenopathy.  Neurological: She is alert.  Skin: Skin is warm and dry. She is not diaphoretic.  Nursing note and vitals reviewed.   ED Course  Procedures (including critical care time) Labs Review Labs Reviewed - No data to display  Imaging Review Dg Chest 2 View  11/09/2015  CLINICAL DATA:  Patient with cough for 5 days. EXAM: CHEST  2 VIEW COMPARISON:  Chest radiograph 06/30/2015 FINDINGS: Anterior cervical spinal fusion hardware. Stable  enlarged cardiac and mediastinal contours. No consolidative pulmonary opacities. No pleural effusion or pneumothorax. Regional skeleton is unremarkable. IMPRESSION: No acute cardiopulmonary process. Electronically Signed   By: Lovey Newcomer M.D.   On: 11/09/2015 18:47   I have personally reviewed and evaluated these images and lab results as part of my medical decision-making.   EKG Interpretation   Date/Time:  Saturday November 09 2015 18:22:05 EST Ventricular Rate:  93 PR Interval:  131 QRS Duration: 131 QT Interval:  413 QTC Calculation: 514 R Axis:   65 Text Interpretation:  Sinus rhythm Left bundle branch block Since previous  tracing Left bundle branch block is new Confirmed by Canary Brim  MD, MARTHA  [phone]) on 11/09/2015 8:03:52 PM      MDM   Final diagnoses:  Cough due to ACE inhibitor    Mliss Sax  Loss presents with a dry, nonproductive cough following starting lisinopril this week.  Findings and plan of care discussed with Alfonzo Beers, MD.  Suspect pt has a ACE-inhibitor induced cough. Pt advises that she can not stand this. Pt was advised to stop taking the lisinopril and follow up with her PCP on Monday. Patient will replace lisinopril with HCTZ until she can get in to see her PCP. Pt agreed to this plan and is comfortable with discharge.     Lorayne Bender, PA-C 11/09/15 2006  Alfonzo Beers, MD 11/09/15 2010

## 2015-11-09 NOTE — ED Notes (Addendum)
Pt states she takes lisinopril, a new medication.  Told by brother she should come because she lives alone. Has been on ATB but feels worse.

## 2015-12-05 DIAGNOSIS — T464X5A Adverse effect of angiotensin-converting-enzyme inhibitors, initial encounter: Secondary | ICD-10-CM | POA: Insufficient documentation

## 2015-12-05 DIAGNOSIS — R058 Other specified cough: Secondary | ICD-10-CM | POA: Insufficient documentation

## 2015-12-21 ENCOUNTER — Emergency Department (HOSPITAL_COMMUNITY)
Admission: EM | Admit: 2015-12-21 | Discharge: 2015-12-21 | Disposition: A | Discharge status: Home / Self Care | Payer: Medicare Other | Attending: Emergency Medicine | Admitting: Emergency Medicine

## 2015-12-21 ENCOUNTER — Encounter (HOSPITAL_COMMUNITY): Payer: Self-pay | Admitting: *Deleted

## 2015-12-21 DIAGNOSIS — Z8709 Personal history of other diseases of the respiratory system: Secondary | ICD-10-CM | POA: Diagnosis not present

## 2015-12-21 DIAGNOSIS — G8929 Other chronic pain: Secondary | ICD-10-CM | POA: Insufficient documentation

## 2015-12-21 DIAGNOSIS — Z8614 Personal history of Methicillin resistant Staphylococcus aureus infection: Secondary | ICD-10-CM | POA: Diagnosis not present

## 2015-12-21 DIAGNOSIS — Z8739 Personal history of other diseases of the musculoskeletal system and connective tissue: Secondary | ICD-10-CM | POA: Diagnosis not present

## 2015-12-21 DIAGNOSIS — Z91040 Latex allergy status: Secondary | ICD-10-CM | POA: Insufficient documentation

## 2015-12-21 DIAGNOSIS — F419 Anxiety disorder, unspecified: Secondary | ICD-10-CM | POA: Insufficient documentation

## 2015-12-21 DIAGNOSIS — R42 Dizziness and giddiness: Secondary | ICD-10-CM | POA: Diagnosis present

## 2015-12-21 DIAGNOSIS — Z880 Allergy status to penicillin: Secondary | ICD-10-CM | POA: Diagnosis not present

## 2015-12-21 DIAGNOSIS — Z872 Personal history of diseases of the skin and subcutaneous tissue: Secondary | ICD-10-CM | POA: Diagnosis not present

## 2015-12-21 DIAGNOSIS — Z87891 Personal history of nicotine dependence: Secondary | ICD-10-CM | POA: Insufficient documentation

## 2015-12-21 DIAGNOSIS — Z79899 Other long term (current) drug therapy: Secondary | ICD-10-CM | POA: Insufficient documentation

## 2015-12-21 DIAGNOSIS — R1084 Generalized abdominal pain: Secondary | ICD-10-CM | POA: Insufficient documentation

## 2015-12-21 DIAGNOSIS — Z7951 Long term (current) use of inhaled steroids: Secondary | ICD-10-CM | POA: Diagnosis not present

## 2015-12-21 DIAGNOSIS — Z7952 Long term (current) use of systemic steroids: Secondary | ICD-10-CM | POA: Insufficient documentation

## 2015-12-21 DIAGNOSIS — K219 Gastro-esophageal reflux disease without esophagitis: Secondary | ICD-10-CM | POA: Diagnosis not present

## 2015-12-21 DIAGNOSIS — F329 Major depressive disorder, single episode, unspecified: Secondary | ICD-10-CM | POA: Diagnosis not present

## 2015-12-21 DIAGNOSIS — I10 Essential (primary) hypertension: Secondary | ICD-10-CM | POA: Diagnosis not present

## 2015-12-21 LAB — URINALYSIS, ROUTINE W REFLEX MICROSCOPIC
BILIRUBIN URINE: NEGATIVE
Glucose, UA: NEGATIVE mg/dL
Hgb urine dipstick: NEGATIVE
KETONES UR: NEGATIVE mg/dL
LEUKOCYTES UA: NEGATIVE
NITRITE: NEGATIVE
PH: 6 (ref 5.0–8.0)
PROTEIN: NEGATIVE mg/dL
Specific Gravity, Urine: 1.006 (ref 1.005–1.030)

## 2015-12-21 LAB — HEPATIC FUNCTION PANEL
ALK PHOS: 80 U/L (ref 38–126)
ALT: 29 U/L (ref 14–54)
AST: 23 U/L (ref 15–41)
Albumin: 4.1 g/dL (ref 3.5–5.0)
BILIRUBIN INDIRECT: 0.7 mg/dL (ref 0.3–0.9)
Bilirubin, Direct: 0.1 mg/dL (ref 0.1–0.5)
TOTAL PROTEIN: 7.4 g/dL (ref 6.5–8.1)
Total Bilirubin: 0.8 mg/dL (ref 0.3–1.2)

## 2015-12-21 LAB — BASIC METABOLIC PANEL
Anion gap: 8 (ref 5–15)
BUN: 12 mg/dL (ref 6–20)
CHLORIDE: 102 mmol/L (ref 101–111)
CO2: 30 mmol/L (ref 22–32)
CREATININE: 0.86 mg/dL (ref 0.44–1.00)
Calcium: 8.9 mg/dL (ref 8.9–10.3)
GFR calc Af Amer: >60 mL/min (ref >60)
GFR calc non Af Amer: >60 mL/min (ref >60)
GLUCOSE: 100 mg/dL — AB (ref 65–99)
Potassium: 4.1 mmol/L (ref 3.5–5.1)
Sodium: 140 mmol/L (ref 135–145)

## 2015-12-21 LAB — CBC
HEMATOCRIT: 42.1 % (ref 36.0–46.0)
Hemoglobin: 13.7 g/dL (ref 12.0–15.0)
MCH: 29.9 pg (ref 26.0–34.0)
MCHC: 32.5 g/dL (ref 30.0–36.0)
MCV: 91.9 fL (ref 78.0–100.0)
PLATELETS: 265 10*3/uL (ref 150–400)
RBC: 4.58 MIL/uL (ref 3.87–5.11)
RDW: 13 % (ref 11.5–15.5)
WBC: 10.2 10*3/uL (ref 4.0–10.5)

## 2015-12-21 LAB — CBG MONITORING, ED: Glucose-Capillary: 72 mg/dL (ref 65–99)

## 2015-12-21 LAB — D-DIMER, QUANTITATIVE (NOT AT ARMC)

## 2015-12-21 MED ORDER — SODIUM CHLORIDE 0.9 % IV BOLUS (SEPSIS)
1000.0000 mL | Freq: Once | INTRAVENOUS | Status: AC
  Administered 2015-12-21: 1000 mL via INTRAVENOUS

## 2015-12-21 NOTE — Discharge Instructions (Signed)
Eat a normal diet - at least 3 meals a day - preferrably 6 small meals a day See your doctor this week for recheck The new blood pressure medicine may be making you feel this way - your doctor may want to change this.  Please obtain all of your results from medical records or have your doctors office obtain the results - share them with your doctor - you should be seen at your doctors office in the next 2 days. Call today to arrange your follow up. Take the medications as prescribed. Please review all of the medicines and only take them if you do not have an allergy to them. Please be aware that if you are taking birth control pills, taking other prescriptions, ESPECIALLY ANTIBIOTICS may make the birth control ineffective - if this is the case, either do not engage in sexual activity or use alternative methods of birth control such as condoms until you have finished the medicine and your family doctor says it is OK to restart them. If you are on a blood thinner such as COUMADIN, be aware that any other medicine that you take may cause the coumadin to either work too much, or not enough - you should have your coumadin level rechecked in next 7 days if this is the case.  ?  It is also a possibility that you have an allergic reaction to any of the medicines that you have been prescribed - Everybody reacts differently to medications and while MOST people have no trouble with most medicines, you may have a reaction such as nausea, vomiting, rash, swelling, shortness of breath. If this is the case, please stop taking the medicine immediately and contact your physician.  ?  You should return to the ER if you develop severe or worsening symptoms.

## 2015-12-21 NOTE — ED Provider Notes (Signed)
CSN: [id number]     Arrival date & time 12/21/15  1659 History   First MD Initiated Contact with Patient 12/21/15 1727     Chief Complaint  Patient presents with  . Dizziness     (Consider location/radiation/quality/duration/timing/severity/associated sxs/prior Treatment) HPI Comments: The patient is a 59 year old female, she is very obese, she does have a history ofirritable bowel syndrome, cervical spine fusion with a blood clot that occurred post op. She also has a history of depression, acid reflux and a pain syndrome not well clarified according to the nose. She reports that over the last week she has had increased dizziness, she reports that this is a lightheadedness that occurs at different times a day but mostly when she is standing. She has not had a syncopal episode, she does not feel vertigo, she does not feel ataxic. She is also concerned because she has a feeling of muscle spasms over her body including her left upper quadrant, left chest and left back, there is no coughing or shortness of breath with this. She denies any swelling of her legs.  Patient is a 59 y.o. female presenting with dizziness. The history is provided by the patient.  Dizziness   Past Medical History  Diagnosis Date  . Acute lymphadenitis 08/13/2009  . Acute pharyngitis 02/10/2010  . ALLERGIC RHINITIS 08/10/2007  . ANXIETY 12/06/2007  . BACK PAIN 12/06/2007  . CHEST PAIN 08/13/2009  . DEPRESSION 12/06/2007  . DIVERTICULOSIS, [place] 12/06/2007  . GERD 12/06/2007  . HYPERTENSION 12/06/2007  . LOW BACK PAIN 12/06/2007  . MRSA 02/07/2009  . Pain in joint, multiple sites 04/21/2010  . PARONYCHIA, FINGER 08/13/2009  . RASH-NONVESICULAR 05/09/2008  . SHOULDER PAIN, LEFT 05/09/2008  . SINUSITIS- ACUTE-NOS 05/09/2008  . THORACIC/LUMBOSACRAL NEURITIS/RADICULITIS UNSPEC 12/28/2008  . Lumbar disc disease 03/09/2012  . Cervical disc disease 03/09/2012  . Chronic pain 03/09/2012  . Impaired glucose tolerance 03/13/2012  .  Cellulitis   . Atherosclerotic peripheral vascular disease ([place]) 06/13/2013    Aorta on CT June 2014   Past Surgical History  Procedure Laterality Date  . Abdominal hysterectomy    . S/p ovary cyst    . S/p right knee arthroscopy      Dr. Mardelle Matte ortho  . Cervical disc surgery     Family History  Problem Relation Age of Onset  . Heart disease Mother   . Hypertension Mother   . Diabetes Mother   . Heart failure Mother   . Asthma Sister   . Asthma Daughter   . Cancer Maternal Uncle     [place]  . Cancer Other     ovarian  . Hypertension Father    Social History  Substance Use Topics  . Smoking status: Former Smoker -- 30 years    Quit date: 11/08/2008  . Smokeless tobacco: Never Used     Comment: quit 10/09  . Alcohol Use: No   OB History    No data available     Review of Systems  Neurological: Positive for dizziness.  All other systems reviewed and are negative.     Allergies  Adhesive; Amoxicillin-pot clavulanate; Codeine; Lactose intolerance (gi); Morphine and related; Vicodin; and Latex  Home Medications   Prior to Admission medications   Medication Sig Start Date End Date Taking? Authorizing Provider  ALPRAZolam Duanne Moron) 0.5 MG tablet take 1 tablet by mouth twice a day if needed for ANXIETY 09/05/14  Yes Biagio Borg, MD  cyclobenzaprine (FLEXERIL) 10 MG tablet Take 10  mg by mouth 2 (two) times daily as needed for muscle spasms.  02/07/15  Yes Historical Provider, MD  dicyclomine (BENTYL) 20 MG tablet Take 1 tablet (20 mg total) by mouth 2 (two) times daily as needed for spasms. 03/18/15  Yes Julianne Rice, MD  fluticasone (FLONASE) 50 MCG/ACT nasal spray Place 1 spray into the nose daily as needed for allergies.  03/07/15  Yes Historical Provider, MD  furosemide (LASIX) 20 MG tablet Take 20 mg by mouth every morning.  06/03/15  Yes Historical Provider, MD  hydroxypropyl methylcellulose / hypromellose (ISOPTO TEARS / GONIOVISC) 2.5 % ophthalmic solution Place 1  drop into both eyes 3 (three) times daily as needed for dry eyes.   Yes Historical Provider, MD  losartan (COZAAR) 50 MG tablet Take 50 mg by mouth daily. 12/05/15  Yes Historical Provider, MD  ondansetron (ZOFRAN ODT) 4 MG disintegrating tablet Take 1 tablet (4 mg total) by mouth every 8 (eight) hours as needed for nausea. 06/30/15  Yes Noemi Chapel, MD  Oxycodone HCl 10 MG TABS Take 10 mg by mouth every 6 (six) hours as needed. Pain. 12/11/15  Yes Historical Provider, MD  pantoprazole (PROTONIX) 40 MG tablet Take 40 mg by mouth daily. 12/06/15  Yes Historical Provider, MD  rOPINIRole (REQUIP) 0.5 MG tablet Take 0.5 mg by mouth at bedtime. 12/06/15  Yes Historical Provider, MD  triamcinolone cream (KENALOG) 0.1 % Apply 1 application topically 2 (two) times daily.  06/03/15  Yes Historical Provider, MD  omeprazole (PRILOSEC OTC) 20 MG tablet Take 20 mg by mouth daily as needed (FOR ACID REFLUX).  10/17/14 10/17/15  Historical Provider, MD   BP 156/62 mmHg  Pulse 81  Temp(Src) 97.5 F (36.4 C) (Oral)  Resp 16  SpO2 100% Physical Exam  Constitutional: She appears well-developed and well-nourished. No distress.  HENT:  Head: Normocephalic and atraumatic.  Mouth/Throat: Oropharynx is clear and moist. No oropharyngeal exudate.  Eyes: Conjunctivae and EOM are normal. Pupils are equal, round, and reactive to light. Right eye exhibits no discharge. Left eye exhibits no discharge. No scleral icterus.  Neck: Normal range of motion. Neck supple. No JVD present. No thyromegaly present.  Cardiovascular: Normal rate, regular rhythm, normal heart sounds and intact distal pulses.  Exam reveals no gallop and no friction rub.   No murmur heard. Pulmonary/Chest: Effort normal and breath sounds normal. No respiratory distress. She has no wheezes. She has no rales.  Abdominal: Soft. Bowel sounds are normal. She exhibits no distension and no mass. There is tenderness (mild diffuse abdominal tenderness).   Musculoskeletal: Normal range of motion. She exhibits no edema or tenderness.  No edema or asymmetry of the lower extremities but has diffuse bilateral lower extremity tenderness to palpation  Lymphadenopathy:    She has no cervical adenopathy.  Neurological: She is alert. Coordination normal.  Speech is clear, cranial nerves III through XII are intact, memory is intact, strength is normal in all 4 extremities including grips, sensation is intact to light touch and pinprick in all 4 extremities. Coordination as tested by finger-nose-finger is normal, no limb ataxia. Normal gait, normal reflexes at the patellar tendons bilaterally  Skin: Skin is warm and dry. No rash noted. No erythema.  Psychiatric: She has a normal mood and affect. Her behavior is normal.  Nursing note and vitals reviewed.   ED Course  Procedures (including critical care time) Labs Review Labs Reviewed  BASIC METABOLIC PANEL - Abnormal; Notable for the following:    Glucose,  Bld 100 (*)    All other components within normal limits  CBC  URINALYSIS, ROUTINE W REFLEX MICROSCOPIC (NOT AT [facility])  HEPATIC FUNCTION PANEL  D-DIMER, QUANTITATIVE (NOT AT [facility])  CBG MONITORING, ED    Imaging Review No results found. I have personally reviewed and evaluated these images and lab results as part of my medical decision-making.   EKG Interpretation   Date/Time:  Saturday December 21 2015 17:57:48 EST Ventricular Rate:  75 PR Interval:  158 QRS Duration: 101 QT Interval:  399 QTC Calculation: 446 R Axis:   56 Text Interpretation:  Sinus rhythm Low voltage, precordial leads since  last tracing no significant change Confirmed by Rochell Mabie  MD, Tawfiq Favila ([id number])  on 12/21/2015 7:09:09 PM      MDM   Final diagnoses:  Light headed    The patient appears well, she does not have severely elevated blood pressure, it is in a tolerable range. She has recently started an angiotensin receptor blocker as adjunct therapy for her  blood pressure. She appears to be tolerating this well however her symptoms have not started until she started that medication. Check potassium, sodium, urinalysis as she does report some dark colored urine, d-dimer as she does have a history of PE, otherwise the patient does not appear toxic.  Labs unremarkable - pt informed of all results - slight orthostatic change in pulse - fluids given, glucose borderline low - given food and counseled to get more  In diet.  Possible med reaction - can ge evaluated by PCP forthis.  Meds given in ED:  Medications  sodium chloride 0.9 % bolus 1,000 mL (1,000 mLs Intravenous New Bag/Given 12/21/15 1819)         Noemi Chapel, MD 12/21/15 [phone]

## 2015-12-21 NOTE — ED Notes (Signed)
EMS reports pt states she has been dizzy for 3 days, Med change from Lisinopril to Losartan a week ago, N/V yesterday, none today.

## 2015-12-21 NOTE — ED Notes (Signed)
Bed: [id number] Expected date: 12/21/15 Expected time: 4:59 PM Means of arrival: Ambulance Comments: dizziness

## 2015-12-22 ENCOUNTER — Encounter (HOSPITAL_COMMUNITY): Payer: Self-pay | Admitting: Emergency Medicine

## 2015-12-22 ENCOUNTER — Emergency Department (HOSPITAL_COMMUNITY)
Admission: EM | Admit: 2015-12-22 | Discharge: 2015-12-22 | Disposition: A | Discharge status: Home / Self Care | Payer: Medicare Other | Attending: Emergency Medicine | Admitting: Emergency Medicine

## 2015-12-22 DIAGNOSIS — F329 Major depressive disorder, single episode, unspecified: Secondary | ICD-10-CM | POA: Insufficient documentation

## 2015-12-22 DIAGNOSIS — G8929 Other chronic pain: Secondary | ICD-10-CM | POA: Insufficient documentation

## 2015-12-22 DIAGNOSIS — Z7951 Long term (current) use of inhaled steroids: Secondary | ICD-10-CM | POA: Diagnosis not present

## 2015-12-22 DIAGNOSIS — I10 Essential (primary) hypertension: Secondary | ICD-10-CM | POA: Diagnosis not present

## 2015-12-22 DIAGNOSIS — R42 Dizziness and giddiness: Secondary | ICD-10-CM

## 2015-12-22 DIAGNOSIS — M79669 Pain in unspecified lower leg: Secondary | ICD-10-CM | POA: Diagnosis not present

## 2015-12-22 DIAGNOSIS — Z91040 Latex allergy status: Secondary | ICD-10-CM | POA: Insufficient documentation

## 2015-12-22 DIAGNOSIS — Z87891 Personal history of nicotine dependence: Secondary | ICD-10-CM | POA: Insufficient documentation

## 2015-12-22 DIAGNOSIS — K219 Gastro-esophageal reflux disease without esophagitis: Secondary | ICD-10-CM | POA: Insufficient documentation

## 2015-12-22 DIAGNOSIS — Z7952 Long term (current) use of systemic steroids: Secondary | ICD-10-CM | POA: Diagnosis not present

## 2015-12-22 DIAGNOSIS — Z880 Allergy status to penicillin: Secondary | ICD-10-CM | POA: Diagnosis not present

## 2015-12-22 DIAGNOSIS — F419 Anxiety disorder, unspecified: Secondary | ICD-10-CM | POA: Diagnosis not present

## 2015-12-22 DIAGNOSIS — Z8619 Personal history of other infectious and parasitic diseases: Secondary | ICD-10-CM | POA: Insufficient documentation

## 2015-12-22 DIAGNOSIS — Z79899 Other long term (current) drug therapy: Secondary | ICD-10-CM | POA: Insufficient documentation

## 2015-12-22 DIAGNOSIS — Z8614 Personal history of Methicillin resistant Staphylococcus aureus infection: Secondary | ICD-10-CM | POA: Diagnosis not present

## 2015-12-22 DIAGNOSIS — Z872 Personal history of diseases of the skin and subcutaneous tissue: Secondary | ICD-10-CM | POA: Insufficient documentation

## 2015-12-22 DIAGNOSIS — Z8709 Personal history of other diseases of the respiratory system: Secondary | ICD-10-CM | POA: Diagnosis not present

## 2015-12-22 DIAGNOSIS — M79643 Pain in unspecified hand: Secondary | ICD-10-CM | POA: Diagnosis not present

## 2015-12-22 DIAGNOSIS — R55 Syncope and collapse: Secondary | ICD-10-CM | POA: Diagnosis present

## 2015-12-22 LAB — BASIC METABOLIC PANEL
Anion gap: 10 (ref 5–15)
BUN: 11 mg/dL (ref 6–20)
CHLORIDE: 105 mmol/L (ref 101–111)
CO2: 29 mmol/L (ref 22–32)
Calcium: 9.3 mg/dL (ref 8.9–10.3)
Creatinine, Ser: 0.89 mg/dL (ref 0.44–1.00)
GFR calc Af Amer: >60 mL/min (ref >60)
GFR calc non Af Amer: >60 mL/min (ref >60)
Glucose, Bld: 79 mg/dL (ref 65–99)
POTASSIUM: 3.9 mmol/L (ref 3.5–5.1)
SODIUM: 144 mmol/L (ref 135–145)

## 2015-12-22 LAB — URINALYSIS, ROUTINE W REFLEX MICROSCOPIC
Bilirubin Urine: NEGATIVE
GLUCOSE, UA: NEGATIVE mg/dL
Hgb urine dipstick: NEGATIVE
Ketones, ur: NEGATIVE mg/dL
LEUKOCYTES UA: NEGATIVE
Nitrite: NEGATIVE
PH: 5.5 (ref 5.0–8.0)
Protein, ur: NEGATIVE mg/dL
Specific Gravity, Urine: 1.008 (ref 1.005–1.030)

## 2015-12-22 LAB — CBC
HEMATOCRIT: 44.1 % (ref 36.0–46.0)
Hemoglobin: 14.5 g/dL (ref 12.0–15.0)
MCH: 30.2 pg (ref 26.0–34.0)
MCHC: 32.9 g/dL (ref 30.0–36.0)
MCV: 91.9 fL (ref 78.0–100.0)
Platelets: 282 10*3/uL (ref 150–400)
RBC: 4.8 MIL/uL (ref 3.87–5.11)
RDW: 13.1 % (ref 11.5–15.5)
WBC: 9.2 10*3/uL (ref 4.0–10.5)

## 2015-12-22 LAB — CBG MONITORING, ED
Glucose-Capillary: 69 mg/dL (ref 65–99)
Glucose-Capillary: 68 mg/dL (ref 65–99)
Glucose-Capillary: 96 mg/dL (ref 65–99)

## 2015-12-22 MED ORDER — MECLIZINE HCL 25 MG PO TABS
25.0000 mg | ORAL_TABLET | Freq: Once | ORAL | Status: AC
  Administered 2015-12-22: 25 mg via ORAL
  Filled 2015-12-22: qty 1

## 2015-12-22 MED ORDER — MECLIZINE HCL 12.5 MG PO TABS
25.0000 mg | ORAL_TABLET | Freq: Two times a day (BID) | ORAL | Status: DC | PRN
Start: 2015-12-22 — End: 2016-04-28

## 2015-12-22 NOTE — ED Notes (Signed)
Pt was given sandwich & drink.

## 2015-12-22 NOTE — ED Notes (Signed)
Pt states that she has came in for lightheadedness yesterday and today woke up feeling the same but with a headache as well. Also states her throat feels abnormal when she is lightheaded. Alert and oriented. Neuro intact at present.

## 2015-12-22 NOTE — ED Provider Notes (Signed)
CSN: [id number]     Arrival date & time 12/22/15  33 History   First MD Initiated Contact with Patient 12/22/15 2020     Chief Complaint  Patient presents with  . Near Syncope   HPI   Maria [name] is a 60 y.o. F PMH significant for anxiety, depression, HTN, chronic pain presenting with a 1 week history of lightheadedness. Maria [name] was seen in the ED here yesterday for the same, but this morning Maria [name] awoke with a headache which has since resolved. Maria [name] states Maria [name] throat feels abnormal when Maria [name] is lightheaded. No fevers, chills, CP, SOB, cough, nasal congestion, leg swelling.   Past Medical History  Diagnosis Date  . Acute lymphadenitis 08/13/2009  . Acute pharyngitis 02/10/2010  . ALLERGIC RHINITIS 08/10/2007  . ANXIETY 12/06/2007  . BACK PAIN 12/06/2007  . CHEST PAIN 08/13/2009  . DEPRESSION 12/06/2007  . DIVERTICULOSIS, [place] 12/06/2007  . GERD 12/06/2007  . HYPERTENSION 12/06/2007  . LOW BACK PAIN 12/06/2007  . MRSA 02/07/2009  . Pain in joint, multiple sites 04/21/2010  . PARONYCHIA, FINGER 08/13/2009  . RASH-NONVESICULAR 05/09/2008  . SHOULDER PAIN, LEFT 05/09/2008  . SINUSITIS- ACUTE-NOS 05/09/2008  . THORACIC/LUMBOSACRAL NEURITIS/RADICULITIS UNSPEC 12/28/2008  . Lumbar disc disease 03/09/2012  . Cervical disc disease 03/09/2012  . Chronic pain 03/09/2012  . Impaired glucose tolerance 03/13/2012  . Cellulitis   . Atherosclerotic peripheral vascular disease ([place]) 06/13/2013    Aorta on CT June 2014   Past Surgical History  Procedure Laterality Date  . Abdominal hysterectomy    . S/p ovary cyst    . S/p right knee arthroscopy      Dr. Mardelle Matte ortho  . Cervical disc surgery     Family History  Problem Relation Age of Onset  . Heart disease Mother   . Hypertension Mother   . Diabetes Mother   . Heart failure Mother   . Asthma Sister   . Asthma Daughter   . Cancer Maternal Uncle     [place]  . Cancer Other     ovarian  . Hypertension Father    Social History  Substance Use Topics   . Smoking status: Former Smoker -- 30 years    Quit date: 11/08/2008  . Smokeless tobacco: Never Used     Comment: quit 10/09  . Alcohol Use: No   OB History    No data available     Review of Systems  Ten systems are reviewed and are negative for acute change except as noted in the HPI  Allergies  Adhesive; Amoxicillin-pot clavulanate; Codeine; Lactose intolerance (gi); Morphine and related; Vicodin; and Latex  Home Medications   Prior to Admission medications   Medication Sig Start Date End Date Taking? Authorizing Provider  acetaminophen (TYLENOL) 500 MG tablet Take 1,000 mg by mouth every 6 (six) hours as needed for moderate pain or headache.   Yes Historical Provider, MD  ALPRAZolam Duanne Moron) 0.5 MG tablet take 1 tablet by mouth twice a day if needed for ANXIETY 09/05/14  Yes Biagio Borg, MD  cyclobenzaprine (FLEXERIL) 10 MG tablet Take 10 mg by mouth 2 (two) times daily as needed for muscle spasms.  02/07/15  Yes Historical Provider, MD  fluticasone (FLONASE) 50 MCG/ACT nasal spray Place 1 spray into the nose daily as needed for allergies.  03/07/15  Yes Historical Provider, MD  furosemide (LASIX) 20 MG tablet Take 20 mg by mouth every morning.  06/03/15  Yes Historical Provider, MD  hydroxypropyl methylcellulose /  hypromellose (ISOPTO TEARS / GONIOVISC) 2.5 % ophthalmic solution Place 1 drop into both eyes 3 (three) times daily as needed for dry eyes.   Yes Historical Provider, MD  losartan (COZAAR) 50 MG tablet Take 50 mg by mouth daily. 12/05/15  Yes Historical Provider, MD  ondansetron (ZOFRAN ODT) 4 MG disintegrating tablet Take 1 tablet (4 mg total) by mouth every 8 (eight) hours as needed for nausea. 06/30/15  Yes Noemi Chapel, MD  Oxycodone HCl 10 MG TABS Take 10 mg by mouth every 6 (six) hours as needed. Pain. 12/11/15  Yes Historical Provider, MD  pantoprazole (PROTONIX) 40 MG tablet Take 40 mg by mouth daily. 12/06/15  Yes Historical Provider, MD  rOPINIRole (REQUIP) 0.5  MG tablet Take 0.5 mg by mouth at bedtime. 12/06/15  Yes Historical Provider, MD  triamcinolone cream (KENALOG) 0.1 % Apply 1 application topically 2 (two) times daily.  06/03/15  Yes Historical Provider, MD  omeprazole (PRILOSEC OTC) 20 MG tablet Take 20 mg by mouth daily as needed (FOR ACID REFLUX).  10/17/14 10/17/15  Historical Provider, MD   BP 159/76 mmHg  Pulse 75  Temp(Src) 98.4 F (36.9 C) (Oral)  Resp 20  SpO2 100% Physical Exam  Constitutional: Maria [name] is oriented to person, place, and time. Maria [name] appears well-developed and well-nourished. No distress.  HENT:  Head: Normocephalic and atraumatic.  Right Ear: External ear normal.  Left Ear: External ear normal.  Nose: Nose normal.  Mouth/Throat: Oropharynx is clear and moist. No oropharyngeal exudate.  Airway intact.  Eyes: Conjunctivae are normal. Pupils are equal, round, and reactive to light. Right eye exhibits no discharge. Left eye exhibits no discharge. No scleral icterus.  Neck: No tracheal deviation present.  Cardiovascular: Normal rate, regular rhythm, normal heart sounds and intact distal pulses.  Exam reveals no gallop and no friction rub.   No murmur heard. Pulmonary/Chest: Effort normal and breath sounds normal. No respiratory distress. Maria [name] has no wheezes. Maria [name] has no rales. Maria [name] exhibits no tenderness.  Abdominal: Soft. Bowel sounds are normal. Maria [name] exhibits no distension and no mass. There is no tenderness. There is no rebound and no guarding.  Musculoskeletal: Normal range of motion. Maria [name] exhibits tenderness. Maria [name] exhibits no edema.  Diffuse extremity tenderness. Negative Homan's.  Lymphadenopathy:    Maria [name] has no cervical adenopathy.  Neurological: Maria [name] is alert and oriented to person, place, and time. No cranial nerve deficit. Coordination normal.  Skin: Skin is warm and dry. No rash noted. Maria [name] is not diaphoretic. No erythema.  Psychiatric: Maria [name] has a normal mood and affect. Her behavior is normal.  Nursing note and vitals  reviewed.   ED Course  Procedures (including critical care time) Labs Review Labs Reviewed  BASIC METABOLIC PANEL  CBC  URINALYSIS, ROUTINE W REFLEX MICROSCOPIC (NOT AT [facility])  CBG MONITORING, ED  CBG MONITORING, ED  CBG MONITORING, ED    EKG Interpretation   Date/Time:  Sunday December 22 2015 17:57:11 EST Ventricular Rate:  77 PR Interval:  159 QRS Duration: 110 QT Interval:  394 QTC Calculation: 446 R Axis:   57 Text Interpretation:  Sinus rhythm Low voltage, precordial leads  Borderline T abnormalities, anterior leads Baseline wander in lead(s) V3  since last tracing no significant change Confirmed by MILLER  MD, BRIAN  [phone]) on 12/22/2015 5:59:48 PM      MDM   Final diagnoses:  Dizziness   Patient non-toxic appearing. Hypertensive on exam. Recently started an ARB for HTN management, and this is when  her symptoms started. Maria [name] was told yesterday to increase her PO intake, which Maria [name] has not done. Her symptoms are likely due to medication change, hypoglycemia, or dehydration. CBG is 68. Will give a sandwich and fluids and reassess. UA, BMP, CBC, EKG unremarkable. Patient feeling better on reassessment and CBG is now 96. Patient may be safely discharged home. Discussed reasons for return. Patient to follow-up with primary care provider within one week. Patient in understanding and agreement with the plan.   Fennimore Lions, PA-C 12/29/15 1928  Noemi Chapel, MD 12/31/15 [phone]

## 2015-12-22 NOTE — ED Notes (Signed)
Gave Pt sandwich. 

## 2015-12-22 NOTE — Discharge Instructions (Signed)
Maria [name],  Nice meeting you! Please follow-up with your primary care provider. Return to the emergency department if you develop increasing headache or dizziness. Feel better soon!  S. Wendie Simmer, PA-C   Dizziness Dizziness is a common problem. It is a feeling of unsteadiness or light-headedness. You may feel like you are about to faint. Dizziness can lead to injury if you stumble or fall. Anyone can become dizzy, but dizziness is more common in older adults. This condition can be caused by a number of things, including medicines, dehydration, or illness. HOME CARE INSTRUCTIONS Taking these steps may help with your condition: Eating and Drinking  Drink enough fluid to keep your urine clear or pale yellow. This helps to keep you from becoming dehydrated. Try to drink more clear fluids, such as water.  Do not drink alcohol.  Limit your caffeine intake if directed by your health care provider.  Limit your salt intake if directed by your health care provider. Activity  Avoid making quick movements.  Rise slowly from chairs and steady yourself until you feel okay.  In the morning, first sit up on the side of the bed. When you feel okay, stand slowly while you hold onto something until you know that your balance is fine.  Move your legs often if you need to stand in one place for a long time. Tighten and relax your muscles in your legs while you are standing.  Do not drive or operate heavy machinery if you feel dizzy.  Avoid bending down if you feel dizzy. Place items in your home so that they are easy for you to reach without leaning over. Lifestyle  Do not use any tobacco products, including cigarettes, chewing tobacco, or electronic cigarettes. If you need help quitting, ask your health care provider.  Try to reduce your stress level, such as with yoga or meditation. Talk with your health care provider if you need help. General Instructions  Watch your dizziness for  any changes.  Take medicines only as directed by your health care provider. Talk with your health care provider if you think that your dizziness is caused by a medicine that you are taking.  Tell a friend or a family member that you are feeling dizzy. If he or she notices any changes in your behavior, have this person call your health care provider.  Keep all follow-up visits as directed by your health care provider. This is important. SEEK MEDICAL CARE IF:  Your dizziness does not go away.  Your dizziness or light-headedness gets worse.  You feel nauseous.  You have reduced hearing.  You have new symptoms.  You are unsteady on your feet or you feel like the room is spinning. SEEK IMMEDIATE MEDICAL CARE IF:  You vomit or have diarrhea and are unable to eat or drink anything.  You have problems talking, walking, swallowing, or using your arms, hands, or legs.  You feel generally weak.  You are not thinking clearly or you have trouble forming sentences. It may take a friend or family member to notice this.  You have chest pain, abdominal pain, shortness of breath, or sweating.  Your vision changes.  You notice any bleeding.  You have a headache.  You have neck pain or a stiff neck.  You have a fever.   This information is not intended to replace advice given to you by your health care provider. Make sure you discuss any questions you have with your health care provider.  Document Released: 06/02/2001 Document Revised: 04/23/2015 Document Reviewed: 12/03/2014 Elsevier Interactive Patient Education [employer].

## 2016-03-11 ENCOUNTER — Ambulatory Visit (INDEPENDENT_AMBULATORY_CARE_PROVIDER_SITE_OTHER): Payer: Medicare Other | Admitting: Family Medicine

## 2016-03-11 VITALS — BP 119/79 | HR 83 | Temp 98.5°F | Resp 18 | Ht 63.0 in | Wt 348.0 lb

## 2016-03-11 DIAGNOSIS — J029 Acute pharyngitis, unspecified: Secondary | ICD-10-CM | POA: Diagnosis not present

## 2016-03-11 DIAGNOSIS — J309 Allergic rhinitis, unspecified: Secondary | ICD-10-CM | POA: Diagnosis not present

## 2016-03-11 DIAGNOSIS — Z594 Lack of adequate food and safe drinking water: Secondary | ICD-10-CM | POA: Diagnosis not present

## 2016-03-11 DIAGNOSIS — M7989 Other specified soft tissue disorders: Secondary | ICD-10-CM | POA: Diagnosis not present

## 2016-03-11 DIAGNOSIS — T730XXA Starvation, initial encounter: Secondary | ICD-10-CM

## 2016-03-11 DIAGNOSIS — Z5948 Other specified lack of adequate food: Secondary | ICD-10-CM

## 2016-03-11 NOTE — Patient Instructions (Addendum)
Please elevated your left arm and apply ice to your hand, do gentle range of motion exercises several times a day with shoulder, elbow and hand/fingers  Please take your flonase every day during allergy season, take at night  Try to eat small meals every 3-4 hours  [email or web address]  [email or web address] [phone]    IF you received an x-ray today, you will receive an invoice from [facility]. Please contact [facility] at [phone] with questions or concerns regarding your invoice.   IF you received labwork today, you will receive an invoice from [employer]. Please contact [facility] at [phone] with questions or concerns regarding your invoice.   Our billing staff will not be able to assist you with questions regarding bills from these companies.  You will be contacted with the lab results as soon as they are available. The fastest way to get your results is to activate your My Chart account. Instructions are located on the last page of this paperwork. If you have not heard from [place] regarding the results in 2 weeks, please contact this office.

## 2016-03-11 NOTE — Progress Notes (Signed)
Subjective:    Patient ID: Maria [name], female    DOB: Mar 31, 1956, 60 y.o.   MRN: [id number]  HPI This is a pleasant 60 yo female who presents today with sore throat in the mornings for about a week. Has had some nasal drainage and cough with clear sputum. No fever or chills. She has a history of seasonal allergies at home and has flonase, but has not been using.    She has noticed some tingling in her left hand and forearm since this morning when she awoke. She is also having pain in her wrist and thumb. She has had some hand  swelling and difficulty with ROM. She does not recall any trauma/over use. She does sleep on her hands. She had some neck surgery 2010. Her surgery did not go well and she was hospitalized for a month.   She sees Dr. Jonni Sanger at [place]. She started losartan about 2 months ago and had problems with dizziness, so she was instructed to move her dose to bedtime. She has not eaten anything all day today. She became briefly lightheaded today at triage when her blood pressure was taken. Sensation resolved quickly and she thinks it was from constriction of blood pressure cuff. She has follow up with PCP next month.   Her only income source is SS. She reports that she has been having more difficulty making ends meet and has little food in her house. She is independent with ADLs and able to drive. She lives alone. Children live nearby.   Past Medical History  Diagnosis Date  . Acute lymphadenitis 08/13/2009  . Acute pharyngitis 02/10/2010  . ALLERGIC RHINITIS 08/10/2007  . ANXIETY 12/06/2007  . BACK PAIN 12/06/2007  . CHEST PAIN 08/13/2009  . DEPRESSION 12/06/2007  . DIVERTICULOSIS, [place] 12/06/2007  . GERD 12/06/2007  . HYPERTENSION 12/06/2007  . LOW BACK PAIN 12/06/2007  . MRSA 02/07/2009  . Pain in joint, multiple sites 04/21/2010  . PARONYCHIA, FINGER 08/13/2009  . RASH-NONVESICULAR 05/09/2008  . SHOULDER PAIN, LEFT 05/09/2008  . SINUSITIS- ACUTE-NOS 05/09/2008  .  THORACIC/LUMBOSACRAL NEURITIS/RADICULITIS UNSPEC 12/28/2008  . Lumbar disc disease 03/09/2012  . Cervical disc disease 03/09/2012  . Chronic pain 03/09/2012  . Impaired glucose tolerance 03/13/2012  . Cellulitis   . Atherosclerotic peripheral vascular disease ([place]) 06/13/2013    Aorta on CT June 2014   Past Surgical History  Procedure Laterality Date  . Abdominal hysterectomy    . S/p ovary cyst    . S/p right knee arthroscopy      Dr. Mardelle Matte ortho  . Cervical disc surgery     Family History  Problem Relation Age of Onset  . Heart disease Mother   . Hypertension Mother   . Diabetes Mother   . Heart failure Mother   . Asthma Sister   . Asthma Daughter   . Cancer Maternal Uncle     [place]  . Cancer Other     ovarian  . Hypertension Father    Social History  Substance Use Topics  . Smoking status: Former Smoker -- 30 years    Quit date: 11/08/2008  . Smokeless tobacco: Never Used     Comment: quit 10/09  . Alcohol Use: No       Review of Systems  Constitutional: Negative for fever, chills and unexpected weight change.  HENT: Positive for congestion, postnasal drip, rhinorrhea, sneezing and sore throat. Negative for sinus pressure and trouble swallowing.   Respiratory: Positive for cough. Negative  for shortness of breath and wheezing.   Cardiovascular: Positive for leg swelling (chronic). Negative for chest pain.  Musculoskeletal: Positive for joint swelling (left hand), neck pain (chronic) and neck stiffness (chronic).  Neurological: Positive for light-headedness (1 episode today, resolved).       Objective:   Physical Exam  Constitutional: She is oriented to person, place, and time. She appears well-developed and well-nourished. No distress.  Morbidly obese  HENT:  Head: Normocephalic and atraumatic.  Right Ear: Tympanic membrane, external ear and ear canal normal.  Left Ear: Tympanic membrane, external ear and ear canal normal.  Nose: Mucosal edema and rhinorrhea  present.  Mouth/Throat: Uvula is midline. Posterior oropharyngeal erythema present. No oropharyngeal exudate or posterior oropharyngeal edema.  Eyes: Conjunctivae are normal. Pupils are equal, round, and reactive to light.  Neck: Normal range of motion. Neck supple.  Cardiovascular: Normal rate, regular rhythm and normal heart sounds.   Pulmonary/Chest: Effort normal and breath sounds normal.  Musculoskeletal: Normal range of motion.       Left forearm: Normal.       Left hand: She exhibits swelling (milld, generalized swelling). She exhibits normal range of motion, no tenderness and normal capillary refill.  Left hand with good color, wrist/fingers with normal ROM, good strength, strong radial pulse.   Lymphadenopathy:    She has no cervical adenopathy.  Neurological: She is alert and oriented to person, place, and time.  Skin: Skin is warm and dry. She is not diaphoretic.  Psychiatric: She has a normal mood and affect. Her behavior is normal. Judgment and thought content normal.  Vitals reviewed.     BP 119/79 mmHg  Pulse 83  Temp(Src) 98.5 F (36.9 C) (Oral)  Resp 18  Ht 5\' 3"  (1.6 m)  Wt 348 lb (157.852 kg)  BMI 61.66 kg/m2  SpO2 96%    Assessment & Plan:  1. Sore throat -suspect this is related to allergic rhinitis - resume flonase use daily - tylenol/warm salt water gargles  2. Allergic rhinitis, unspecified allergic rhinitis type - resume flonase  3. Swelling of left hand - elevate, ice - RTC if no improvement or if worsening  4. Lack of food - provided patient information regarding local food banks   Clarene Reamer, FNP-BC  [facility]  03/14/2016 7:44 AM

## 2016-03-19 ENCOUNTER — Emergency Department (HOSPITAL_COMMUNITY): Payer: Medicare Other

## 2016-03-19 ENCOUNTER — Emergency Department (HOSPITAL_BASED_OUTPATIENT_CLINIC_OR_DEPARTMENT_OTHER)
Admit: 2016-03-19 | Discharge: 2016-03-19 | Disposition: A | Discharge status: Home / Self Care | Payer: Medicare Other | Attending: Emergency Medicine | Admitting: Emergency Medicine

## 2016-03-19 ENCOUNTER — Emergency Department (HOSPITAL_COMMUNITY)
Admission: EM | Admit: 2016-03-19 | Discharge: 2016-03-19 | Disposition: A | Discharge status: Home / Self Care | Payer: Medicare Other | Attending: Emergency Medicine | Admitting: Emergency Medicine

## 2016-03-19 ENCOUNTER — Encounter (HOSPITAL_COMMUNITY): Payer: Self-pay

## 2016-03-19 DIAGNOSIS — Z91040 Latex allergy status: Secondary | ICD-10-CM | POA: Diagnosis not present

## 2016-03-19 DIAGNOSIS — I10 Essential (primary) hypertension: Secondary | ICD-10-CM | POA: Diagnosis not present

## 2016-03-19 DIAGNOSIS — R609 Edema, unspecified: Secondary | ICD-10-CM

## 2016-03-19 DIAGNOSIS — F419 Anxiety disorder, unspecified: Secondary | ICD-10-CM | POA: Diagnosis not present

## 2016-03-19 DIAGNOSIS — K219 Gastro-esophageal reflux disease without esophagitis: Secondary | ICD-10-CM | POA: Diagnosis not present

## 2016-03-19 DIAGNOSIS — Z87891 Personal history of nicotine dependence: Secondary | ICD-10-CM | POA: Diagnosis not present

## 2016-03-19 DIAGNOSIS — M7989 Other specified soft tissue disorders: Secondary | ICD-10-CM | POA: Diagnosis not present

## 2016-03-19 DIAGNOSIS — Z8614 Personal history of Methicillin resistant Staphylococcus aureus infection: Secondary | ICD-10-CM | POA: Diagnosis not present

## 2016-03-19 DIAGNOSIS — Z872 Personal history of diseases of the skin and subcutaneous tissue: Secondary | ICD-10-CM | POA: Diagnosis not present

## 2016-03-19 DIAGNOSIS — M79622 Pain in left upper arm: Secondary | ICD-10-CM | POA: Diagnosis not present

## 2016-03-19 DIAGNOSIS — Z880 Allergy status to penicillin: Secondary | ICD-10-CM | POA: Insufficient documentation

## 2016-03-19 DIAGNOSIS — Z79899 Other long term (current) drug therapy: Secondary | ICD-10-CM | POA: Insufficient documentation

## 2016-03-19 DIAGNOSIS — F329 Major depressive disorder, single episode, unspecified: Secondary | ICD-10-CM | POA: Insufficient documentation

## 2016-03-19 DIAGNOSIS — G8929 Other chronic pain: Secondary | ICD-10-CM | POA: Diagnosis not present

## 2016-03-19 DIAGNOSIS — M79602 Pain in left arm: Secondary | ICD-10-CM

## 2016-03-19 LAB — BASIC METABOLIC PANEL
Anion gap: 9 (ref 5–15)
BUN: 7 mg/dL (ref 6–20)
CHLORIDE: 104 mmol/L (ref 101–111)
CO2: 26 mmol/L (ref 22–32)
Calcium: 8.7 mg/dL — ABNORMAL LOW (ref 8.9–10.3)
Creatinine, Ser: 0.94 mg/dL (ref 0.44–1.00)
GFR calc Af Amer: >60 mL/min (ref >60)
GFR calc non Af Amer: >60 mL/min (ref >60)
GLUCOSE: 160 mg/dL — AB (ref 65–99)
POTASSIUM: 3.5 mmol/L (ref 3.5–5.1)
Sodium: 139 mmol/L (ref 135–145)

## 2016-03-19 LAB — CBC
HEMATOCRIT: 41 % (ref 36.0–46.0)
Hemoglobin: 13.6 g/dL (ref 12.0–15.0)
MCH: 30.6 pg (ref 26.0–34.0)
MCHC: 33.2 g/dL (ref 30.0–36.0)
MCV: 92.1 fL (ref 78.0–100.0)
Platelets: 252 10*3/uL (ref 150–400)
RBC: 4.45 MIL/uL (ref 3.87–5.11)
RDW: 12.7 % (ref 11.5–15.5)
WBC: 6.3 10*3/uL (ref 4.0–10.5)

## 2016-03-19 LAB — I-STAT TROPONIN, ED: Troponin i, poc: 0 ng/mL (ref 0.00–0.08)

## 2016-03-19 NOTE — Progress Notes (Signed)
*  PRELIMINARY RESULTS* Vascular Ultrasound Left upper extremity venous duplex has been completed.  Preliminary findings: No evidence of DVT or superficial thrombosis.   Landry Mellow, RDMS, RVT  03/19/2016, 7:09 PM

## 2016-03-19 NOTE — Discharge Instructions (Signed)
Return here as needed.  Follow-up with your primary doctor. °

## 2016-03-19 NOTE — ED Notes (Signed)
PA at bedside.

## 2016-03-19 NOTE — ED Notes (Signed)
Ultrasound at bedside

## 2016-03-19 NOTE — ED Notes (Addendum)
Pt c/o L arm swelling, pain, and tingling starting this morning and heart palpitations last night.  Sts pain is starting to radiate into L chest.  Pain score 5/10.  Pt reports that this is the 3rd episode since the end of January.  Pt has been by PCP for same.  Swelling and decreased ROM noted to L arm.  Pt reports multiple neck and spine issues.

## 2016-03-20 NOTE — ED Provider Notes (Signed)
CSN: [id number]     Arrival date & time 03/19/16  1242 History   First MD Initiated Contact with Patient 03/19/16 1755     Chief Complaint  Patient presents with  . Arm Swelling  . Arm Pain  . Palpitations     (Consider location/radiation/quality/duration/timing/severity/associated sxs/prior Treatment) HPI Patient presents to the emergency department with left upper extremity swelling with pain that started over the last couple weeks.  She states that is intermittent.  She states that the swelling was worse earlier today, but has gotten somewhat better.  The patient states she did not take any medications prior to arrival.  She states that nothing makes the condition better or worse.  She states she does have some neck pain associated with this.  The patient states she has noticed some palpitations intermittently from time to timeThe patient denies chest pain, shortness of breath, headache,blurred vision, neck pain, fever, cough, weakness, numbness, dizziness, anorexia, edema, abdominal pain, nausea, vomiting, diarrhea, rash, back pain, dysuria, hematemesis, bloody stool, near syncope, or syncope. Past Medical History  Diagnosis Date  . Acute lymphadenitis 08/13/2009  . Acute pharyngitis 02/10/2010  . ALLERGIC RHINITIS 08/10/2007  . ANXIETY 12/06/2007  . BACK PAIN 12/06/2007  . CHEST PAIN 08/13/2009  . DEPRESSION 12/06/2007  . DIVERTICULOSIS, [place] 12/06/2007  . GERD 12/06/2007  . HYPERTENSION 12/06/2007  . LOW BACK PAIN 12/06/2007  . MRSA 02/07/2009  . Pain in joint, multiple sites 04/21/2010  . PARONYCHIA, FINGER 08/13/2009  . RASH-NONVESICULAR 05/09/2008  . SHOULDER PAIN, LEFT 05/09/2008  . SINUSITIS- ACUTE-NOS 05/09/2008  . THORACIC/LUMBOSACRAL NEURITIS/RADICULITIS UNSPEC 12/28/2008  . Lumbar disc disease 03/09/2012  . Cervical disc disease 03/09/2012  . Chronic pain 03/09/2012  . Impaired glucose tolerance 03/13/2012  . Cellulitis   . Atherosclerotic peripheral vascular disease ([place])  06/13/2013    Aorta on CT June 2014   Past Surgical History  Procedure Laterality Date  . Abdominal hysterectomy    . S/p ovary cyst    . S/p right knee arthroscopy      Dr. Mardelle Matte ortho  . Cervical disc surgery     Family History  Problem Relation Age of Onset  . Heart disease Mother   . Hypertension Mother   . Diabetes Mother   . Heart failure Mother   . Asthma Sister   . Asthma Daughter   . Cancer Maternal Uncle     [place]  . Cancer Other     ovarian  . Hypertension Father    Social History  Substance Use Topics  . Smoking status: Former Smoker -- 30 years    Quit date: 11/08/2008  . Smokeless tobacco: Never Used     Comment: quit 10/09  . Alcohol Use: No   OB History    No data available     Review of Systems  All other systems negative except as documented in the HPI. All pertinent positives and negatives as reviewed in the HPI.  Allergies  Adhesive; Amoxicillin-pot clavulanate; Codeine; Lactose intolerance (gi); Morphine and related; Vicodin; and Latex  Home Medications   Prior to Admission medications   Medication Sig Start Date End Date Taking? Authorizing Provider  acetaminophen (TYLENOL) 500 MG tablet Take 1,000 mg by mouth every 6 (six) hours as needed for moderate pain or headache.   Yes Historical Provider, MD  ALPRAZolam Duanne Moron) 0.5 MG tablet take 1 tablet by mouth twice a day if needed for ANXIETY Patient taking differently: Take 0.5 mg by mouth twice daily as  needed for anxiety 09/05/14  Yes Biagio Borg, MD  cyclobenzaprine (FLEXERIL) 10 MG tablet Take 10 mg by mouth 2 (two) times daily as needed for muscle spasms.  02/07/15  Yes Historical Provider, MD  fluticasone (FLONASE) 50 MCG/ACT nasal spray Place 1 spray into the nose daily as needed for allergies.  03/07/15  Yes Historical Provider, MD  furosemide (LASIX) 20 MG tablet Take 20 mg by mouth every morning.  06/03/15  Yes Historical Provider, MD  hydroxypropyl methylcellulose / hypromellose (ISOPTO  TEARS / GONIOVISC) 2.5 % ophthalmic solution Place 1 drop into both eyes 3 (three) times daily as needed for dry eyes.   Yes Historical Provider, MD  losartan (COZAAR) 50 MG tablet Take 50 mg by mouth daily. 12/05/15  Yes Historical Provider, MD  ondansetron (ZOFRAN ODT) 4 MG disintegrating tablet Take 1 tablet (4 mg total) by mouth every 8 (eight) hours as needed for nausea. 06/30/15  Yes Noemi Chapel, MD  Oxycodone HCl 10 MG TABS Take 10 mg by mouth every 6 (six) hours as needed (pain). Pain. 12/11/15  Yes Historical Provider, MD  pantoprazole (PROTONIX) 40 MG tablet Take 40 mg by mouth daily. 12/06/15  Yes Historical Provider, MD  rOPINIRole (REQUIP) 0.5 MG tablet Take 0.5 mg by mouth at bedtime. 12/06/15  Yes Historical Provider, MD  sertraline (ZOLOFT) 50 MG tablet Take 50 mg by mouth daily.   Yes Historical Provider, MD  triamcinolone cream (KENALOG) 0.1 % Apply 1 application topically 2 (two) times daily as needed (rash).  06/03/15  Yes Historical Provider, MD  meclizine (ANTIVERT) 12.5 MG tablet Take 2 tablets (25 mg total) by mouth 2 (two) times daily as needed. Patient not taking: Reported on 03/19/2016 12/22/15   [place] Lions, PA-C   BP 167/82 mmHg  Pulse 77  Temp(Src) 98.4 F (36.9 C) (Oral)  Resp 17  SpO2 97% Physical Exam  Constitutional: She is oriented to person, place, and time. She appears well-developed and well-nourished. No distress.  HENT:  Head: Normocephalic and atraumatic.  Mouth/Throat: Oropharynx is clear and moist.  Eyes: Pupils are equal, round, and reactive to light.  Neck: Normal range of motion. Neck supple.  Cardiovascular: Normal rate, regular rhythm and normal heart sounds.  Exam reveals no gallop and no friction rub.   No murmur heard. Pulmonary/Chest: Effort normal and breath sounds normal. No respiratory distress. She has no wheezes.  Abdominal: Soft. Bowel sounds are normal. She exhibits no distension. There is no tenderness.  Musculoskeletal:        Arms: Neurological: She is alert and oriented to person, place, and time. She exhibits normal muscle tone. Coordination normal.  Skin: Skin is warm and dry. No rash noted. No erythema.  Psychiatric: She has a normal mood and affect. Her behavior is normal.  Nursing note and vitals reviewed.   ED Course  Procedures (including critical care time) Labs Review Labs Reviewed  BASIC METABOLIC PANEL - Abnormal; Notable for the following:    Glucose, Bld 160 (*)    Calcium 8.7 (*)    All other components within normal limits  CBC  I-STAT TROPOININ, ED    Imaging Review Dg Chest 2 View  03/19/2016  CLINICAL DATA:  Chest pain and palpitations EXAM: CHEST  2 VIEW COMPARISON:  11/09/2015 FINDINGS: The heart size and mediastinal contours are within normal limits. Both lungs are clear. The visualized skeletal structures are unremarkable. Postsurgical changes are again noted in the cervical spine. IMPRESSION: No active cardiopulmonary disease.  Electronically Signed   By: Inez Catalina M.D.   On: 03/19/2016 13:55   I have personally reviewed and evaluated these images and lab results as part of my medical decision-making.   EKG Interpretation None      MDM   Final diagnoses:  Pain of left upper extremity  Swelling of upper extremity    The patient will be discharged home.  She will need follow-up with her primary care doctor.  This swelling has been intermittent for several weeks.  The patient states that she has not have any chest pain, shortness of breath at this time.  Venous Doppler study showed no DVT or superficial thrombosis.  Patient is advised to elevate the arm.  Told to return here as needed    Dalia Heading, PA-C 03/20/16 Luis Llorens Torres, MD 03/21/16 1540

## 2016-03-24 ENCOUNTER — Ambulatory Visit (INDEPENDENT_AMBULATORY_CARE_PROVIDER_SITE_OTHER): Payer: Medicare Other | Admitting: Physician Assistant

## 2016-03-24 ENCOUNTER — Ambulatory Visit (INDEPENDENT_AMBULATORY_CARE_PROVIDER_SITE_OTHER): Payer: Medicare Other

## 2016-03-24 VITALS — BP 125/75 | HR 95 | Temp 97.8°F | Resp 20 | Ht 63.0 in | Wt 345.0 lb

## 2016-03-24 DIAGNOSIS — R0602 Shortness of breath: Secondary | ICD-10-CM

## 2016-03-24 DIAGNOSIS — R1013 Epigastric pain: Secondary | ICD-10-CM | POA: Diagnosis not present

## 2016-03-24 DIAGNOSIS — R197 Diarrhea, unspecified: Secondary | ICD-10-CM | POA: Diagnosis not present

## 2016-03-24 DIAGNOSIS — R112 Nausea with vomiting, unspecified: Secondary | ICD-10-CM

## 2016-03-24 LAB — COMPLETE METABOLIC PANEL WITH GFR
ALBUMIN: 4.3 g/dL (ref 3.6–5.1)
ALK PHOS: 67 U/L (ref 33–130)
ALT: 24 U/L (ref 6–29)
AST: 22 U/L (ref 10–35)
BILIRUBIN TOTAL: 1.3 mg/dL — AB (ref 0.2–1.2)
BUN: 9 mg/dL (ref 7–25)
CALCIUM: 8.9 mg/dL (ref 8.6–10.4)
CO2: 26 mmol/L (ref 20–31)
CREATININE: 0.79 mg/dL (ref 0.50–1.05)
Chloride: 102 mmol/L (ref 98–110)
GFR, EST NON AFRICAN AMERICAN: 83 mL/min (ref >=60)
Glucose, Bld: 116 mg/dL — ABNORMAL HIGH (ref 65–99)
Potassium: 3.9 mmol/L (ref 3.5–5.3)
Sodium: 138 mmol/L (ref 135–146)
TOTAL PROTEIN: 7.1 g/dL (ref 6.1–8.1)

## 2016-03-24 LAB — POCT CBC
Granulocyte percent: 68.8 %G (ref 37–80)
HEMATOCRIT: 41.2 % (ref 37.7–47.9)
HEMOGLOBIN: 14.5 g/dL (ref 12.2–16.2)
LYMPH, POC: 3.1 (ref 0.6–3.4)
MCH, POC: 31.5 pg — AB (ref 27–31.2)
MCHC: 35.2 g/dL (ref 31.8–35.4)
MCV: 89.4 fL (ref 80–97)
MID (CBC): 0.8 (ref 0–0.9)
MPV: 7.6 fL (ref 0–99.8)
POC GRANULOCYTE: 8.6 — AB (ref 2–6.9)
POC LYMPH %: 24.9 % (ref 10–50)
POC MID %: 6.3 % (ref 0–12)
Platelet Count, POC: 280 10*3/uL (ref 142–424)
RBC: 4.61 M/uL (ref 4.04–5.48)
RDW, POC: 12.6 %
WBC: 12.5 10*3/uL — AB (ref 4.6–10.2)

## 2016-03-24 NOTE — Progress Notes (Signed)
Maria [name]  MRN: [id number] DOB: [birth year]  Subjective:  Pt presents to clinic with epigastric pain that started last night - she was not feeling well all day but then last night her pain increased and she developed nausea.  The pain was cramping and was intermittent but when it was at its worse she was having trouble breathing because of the pain - she described her pain as "cramping like labor pains."  This am she had a vomiting episode and then a few hours later the cramping severe abd pain resolved but it is still sore as well as the rest of the abdomen.  She did not eat a fatty meal prior to this pain.  She has had this type of pain in the past and it has sent her to the ED.  She also started this am with 4 watery stools.  Last week she did not have a BM at all and was having generalized abd pain as a result of that.  No F/C.  She is also having some SOB that seems worse when she is having pain and movement.   Referral to GI - plans to go to [facility] for further work-up of this abd pain that is intermittent. She has never had a gallbladder US.  No known sick contacts.  No laxative use. No recent abx use.  No ETOH use.  Patient Active Problem List   Diagnosis Date Noted  . Adverse effect of angiotensin-converting enzyme inhibitor 12/05/2015  . Restless leg 10/08/2015  . Dermatitis, stasis 04/26/2015  . Fibromyalgia 01/31/2015  . Acid reflux 10/17/2014  . 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 Post-traumatic osteoarthritis of both knees 09/19/2014  . Spinal stenosis 09/19/2014  . Acute respiratory failure with hypoxia ([place]) 08/20/2014  . Sepsis ([place]) 08/19/2014  . CAP (community acquired pneumonia) 08/19/2014  . Cervical disc disease 03/09/2012  . Lumbar disc disease 03/09/2012  . Chronic pain 03/09/2012  . Obesity 06/17/2011  . THORACIC/LUMBOSACRAL NEURITIS/RADICULITIS UNSPEC 12/28/2008  . ANXIETY 12/06/2007  . DEPRESSION 12/06/2007  . HYPERTENSION 12/06/2007  .  LOW BACK PAIN 12/06/2007    Current Outpatient Prescriptions on File Prior to Visit  Medication Sig Dispense Refill  . acetaminophen (TYLENOL) 500 MG tablet Take 1,000 mg by mouth every 6 (six) hours as needed for moderate pain or headache.    . ALPRAZolam (XANAX) 0.5 MG tablet take 1 tablet by mouth twice a day if needed for ANXIETY (Patient taking differently: Take 0.5 mg by mouth twice daily as needed for anxiety) 60 tablet 0  . cyclobenzaprine (FLEXERIL) 10 MG tablet Take 10 mg by mouth 2 (two) times daily as needed for muscle spasms.     . fluticasone (FLONASE) 50 MCG/ACT nasal spray Place 1 spray into the nose daily as needed for allergies.     . furosemide (LASIX) 20 MG tablet Take 20 mg by mouth every morning.     . hydroxypropyl methylcellulose / hypromellose (ISOPTO TEARS / GONIOVISC) 2.5 % ophthalmic solution Place 1 drop into both eyes 3 (three) times daily as needed for dry eyes.    Marland Kitchen losartan (COZAAR) 50 MG tablet Take 50 mg by mouth daily.    . ondansetron (ZOFRAN ODT) 4 MG disintegrating tablet Take 1 tablet (4 mg total) by mouth every 8 (eight) hours as needed for nausea. 10 tablet 0  . Oxycodone HCl 10 MG TABS Take 10 mg by mouth every 6 (six) hours as needed (pain). Pain.  0  .  pantoprazole (PROTONIX) 40 MG tablet Take 40 mg by mouth daily.    Marland Kitchen rOPINIRole (REQUIP) 0.5 MG tablet Take 0.5 mg by mouth at bedtime.    . sertraline (ZOLOFT) 50 MG tablet Take 50 mg by mouth daily.    Marland Kitchen triamcinolone cream (KENALOG) 0.1 % Apply 1 application topically 2 (two) times daily as needed (rash).     . meclizine (ANTIVERT) 12.5 MG tablet Take 2 tablets (25 mg total) by mouth 2 (two) times daily as needed. (Patient not taking: Reported on 03/19/2016) 14 tablet 0  . [DISCONTINUED] DULoxetine (CYMBALTA) 60 MG capsule Take 60 mg by mouth daily.      . [DISCONTINUED] fexofenadine (ALLEGRA) 180 MG tablet Take 180 mg by mouth daily.       No current facility-administered medications on file prior  to visit.    Allergies  Allergen Reactions  . Adhesive [Tape] Other (See Comments)    Tears skin off - use paper tape   . Amoxicillin-Pot Clavulanate Nausea And Vomiting  . Codeine Other (See Comments)    hallucinations  . Lactose Intolerance (Gi) Nausea And Vomiting  . Morphine And Related Nausea And Vomiting and Other (See Comments)    headaches  . Vicodin [Hydrocodone-Acetaminophen] Other (See Comments)    hallucinations  . Latex Rash    Review of Systems Objective:  BP 125/75 mmHg  Pulse 95  Temp(Src) 97.8 F (36.6 C) (Oral)  Resp 20  Ht 5\' 3"  (1.6 m)  Wt 345 lb (156.491 kg)  BMI 61.13 kg/m2  SpO2 97%  Physical Exam  Constitutional: She is oriented to person, place, and time and well-developed, well-nourished, and in no distress.  HENT:  Head: Normocephalic and atraumatic.  Right Ear: Hearing and external ear normal.  Left Ear: Hearing and external ear normal.  Eyes: Conjunctivae are normal.  Neck: Normal range of motion.  Cardiovascular: Normal rate, regular rhythm and normal heart sounds.   No murmur heard. Pulmonary/Chest: Effort normal and breath sounds normal. She has no wheezes.  Abdominal: Soft. There is tenderness (generalized TTP over entire abd but seems slightly worse epigastric area). There is no rebound and no guarding.  Neurological: She is alert and oriented to person, place, and time. Gait normal.  Skin: Skin is warm and dry.  Psychiatric: Mood, memory, affect and judgment normal.  Vitals reviewed.  EKG - compared with 12/2015 EKG - slight ST depression in lateral leads but nonspecific -   Dg Abd Acute W/chest  03/24/2016  CLINICAL DATA:  Epigastric pain, nausea, vomiting, diarrhea EXAM: DG ABDOMEN ACUTE W/ 1V CHEST COMPARISON:  Chest x-ray of 03/19/2016 and CT abdomen pelvis of 03/18/2015 FINDINGS: No active infiltrate or effusion is seen. Mediastinal and hilar contours are unremarkable. The heart is mildly enlarged and stable. A lower anterior  cervical spine fusion plate is present. Supine and erect views of the abdomen show no bowel obstruction. On the erect chest x-ray no free air is seen. No opaque calculi are noted. However resolution is limited in view of the large size of the patient. IMPRESSION: 1. No active lung disease. 2. No bowel obstruction.  No free air. Electronically Signed   By: Ivar Drape M.D.   On: 03/24/2016 17:13   Results for orders placed or performed in visit on 03/24/16  POCT CBC  Result Value Ref Range   WBC 12.5 (A) 4.6 - 10.2 K/uL   Lymph, poc 3.1 0.6 - 3.4   POC LYMPH PERCENT 24.9 10 - 50 %  L   MID (cbc) 0.8 0 - 0.9   POC MID % 6.3 0 - 12 %M   POC Granulocyte 8.6 (A) 2 - 6.9   Granulocyte percent 68.8 37 - 80 %G   RBC 4.61 4.04 - 5.48 M/uL   Hemoglobin 14.5 12.2 - 16.2 g/dL   HCT, POC 41.2 37.7 - 47.9 %   MCV 89.4 80 - 97 fL   MCH, POC 31.5 (A) 27 - 31.2 pg   MCHC 35.2 31.8 - 35.4 g/dL   RDW, POC 12.6 %   Platelet Count, POC 280 142 - 424 K/uL   MPV 7.6 0 - 99.8 fL    Assessment and Plan :  Nausea and vomiting, intractability of vomiting not specified, unspecified vomiting type - Plan: EKG 12-Lead, DG Abd Acute W/Chest  Abdominal pain, epigastric - Plan: POCT CBC, COMPLETE METABOLIC PANEL WITH GFR, DG Abd Acute W/Chest  SOB (shortness of breath) - Plan: EKG 12-Lead  Diarrhea, unspecified type   Due to the fact that she has had this before with a slight increase in WBC we will send for a gallbladder US in the next day or so.  The patient arrived at our office and looked like she felt terrible but she has improved while she was here. She states she feels better.  She will continue to push fluids - she will advance diet as tolerated. She will call GI offices to see what the hold up for her appt is because I suspect she may need an endoscopy also.  She will recheck tomorrow if she gets worse again otherwise she will recheck with me on Thursday for repeat labs - hopefully she will have had her  gallbladder US by then.  Dw Dr Linna Darner.  Windell Hummingbird PA-C  [facility] 03/24/2016 4:38 PM

## 2016-03-24 NOTE — Patient Instructions (Addendum)
Clear fluids Advance diet as tolerated Recheck with me on Thursday - unless you are worse in the morning -     IF you received an x-ray today, you will receive an invoice from [facility]. Please contact [facility] at [phone] with questions or concerns regarding your invoice.   IF you received labwork today, you will receive an invoice from [employer]. Please contact [facility] at [phone] with questions or concerns regarding your invoice.   Our billing staff will not be able to assist you with questions regarding bills from these companies.  You will be contacted with the lab results as soon as they are available. The fastest way to get your results is to activate your My Chart account. Instructions are located on the last page of this paperwork. If you have not heard from [place] regarding the results in 2 weeks, please contact this office.

## 2016-03-26 ENCOUNTER — Telehealth: Payer: Self-pay

## 2016-03-26 ENCOUNTER — Ambulatory Visit
Admission: RE | Admit: 2016-03-26 | Discharge: 2016-03-26 | Disposition: A | Discharge status: Home / Self Care | Payer: Medicare Other | Source: Ambulatory Visit | Attending: Physician Assistant | Admitting: Physician Assistant

## 2016-03-26 DIAGNOSIS — R1013 Epigastric pain: Secondary | ICD-10-CM

## 2016-03-26 NOTE — Telephone Encounter (Signed)
Patient is calling to let Judson Roch know that she went to [place] to get her ultra sound done this morning.

## 2016-04-28 ENCOUNTER — Encounter (HOSPITAL_COMMUNITY): Payer: Self-pay | Admitting: Emergency Medicine

## 2016-04-28 ENCOUNTER — Emergency Department (HOSPITAL_COMMUNITY)
Admission: EM | Admit: 2016-04-28 | Discharge: 2016-04-28 | Disposition: A | Discharge status: Home / Self Care | Payer: Medicare Other | Attending: Emergency Medicine | Admitting: Emergency Medicine

## 2016-04-28 ENCOUNTER — Other Ambulatory Visit: Payer: Self-pay

## 2016-04-28 DIAGNOSIS — Z79899 Other long term (current) drug therapy: Secondary | ICD-10-CM | POA: Insufficient documentation

## 2016-04-28 DIAGNOSIS — Z79891 Long term (current) use of opiate analgesic: Secondary | ICD-10-CM | POA: Diagnosis not present

## 2016-04-28 DIAGNOSIS — K219 Gastro-esophageal reflux disease without esophagitis: Secondary | ICD-10-CM | POA: Diagnosis not present

## 2016-04-28 DIAGNOSIS — F329 Major depressive disorder, single episode, unspecified: Secondary | ICD-10-CM | POA: Insufficient documentation

## 2016-04-28 DIAGNOSIS — R51 Headache: Secondary | ICD-10-CM | POA: Diagnosis not present

## 2016-04-28 DIAGNOSIS — R1013 Epigastric pain: Secondary | ICD-10-CM | POA: Diagnosis not present

## 2016-04-28 DIAGNOSIS — Z7952 Long term (current) use of systemic steroids: Secondary | ICD-10-CM | POA: Diagnosis not present

## 2016-04-28 DIAGNOSIS — R109 Unspecified abdominal pain: Secondary | ICD-10-CM | POA: Diagnosis present

## 2016-04-28 DIAGNOSIS — Z87891 Personal history of nicotine dependence: Secondary | ICD-10-CM | POA: Diagnosis not present

## 2016-04-28 DIAGNOSIS — R519 Headache, unspecified: Secondary | ICD-10-CM

## 2016-04-28 DIAGNOSIS — J0111 Acute recurrent frontal sinusitis: Secondary | ICD-10-CM | POA: Diagnosis not present

## 2016-04-28 DIAGNOSIS — I10 Essential (primary) hypertension: Secondary | ICD-10-CM | POA: Diagnosis not present

## 2016-04-28 DIAGNOSIS — J0110 Acute frontal sinusitis, unspecified: Secondary | ICD-10-CM

## 2016-04-28 LAB — CBC
HEMATOCRIT: 42.1 % (ref 36.0–46.0)
HEMOGLOBIN: 14.1 g/dL (ref 12.0–15.0)
MCH: 30.1 pg (ref 26.0–34.0)
MCHC: 33.5 g/dL (ref 30.0–36.0)
MCV: 90 fL (ref 78.0–100.0)
Platelets: 261 10*3/uL (ref 150–400)
RBC: 4.68 MIL/uL (ref 3.87–5.11)
RDW: 12.4 % (ref 11.5–15.5)
WBC: 9.6 10*3/uL (ref 4.0–10.5)

## 2016-04-28 LAB — URINALYSIS, ROUTINE W REFLEX MICROSCOPIC
Bilirubin Urine: NEGATIVE
GLUCOSE, UA: NEGATIVE mg/dL
Hgb urine dipstick: NEGATIVE
KETONES UR: NEGATIVE mg/dL
LEUKOCYTES UA: NEGATIVE
NITRITE: NEGATIVE
PROTEIN: NEGATIVE mg/dL
Specific Gravity, Urine: 1.005 (ref 1.005–1.030)
pH: 7.5 (ref 5.0–8.0)

## 2016-04-28 LAB — HEPATIC FUNCTION PANEL
ALBUMIN: 4.5 g/dL (ref 3.5–5.0)
ALK PHOS: 66 U/L (ref 38–126)
ALT: 33 U/L (ref 14–54)
AST: 30 U/L (ref 15–41)
BILIRUBIN TOTAL: 1.3 mg/dL — AB (ref 0.3–1.2)
Bilirubin, Direct: 0.2 mg/dL (ref 0.1–0.5)
Indirect Bilirubin: 1.1 mg/dL — ABNORMAL HIGH (ref 0.3–0.9)
TOTAL PROTEIN: 7.5 g/dL (ref 6.5–8.1)

## 2016-04-28 LAB — BASIC METABOLIC PANEL
ANION GAP: 12 (ref 5–15)
BUN: 6 mg/dL (ref 6–20)
CO2: 27 mmol/L (ref 22–32)
Calcium: 9.1 mg/dL (ref 8.9–10.3)
Chloride: 100 mmol/L — ABNORMAL LOW (ref 101–111)
Creatinine, Ser: 0.94 mg/dL (ref 0.44–1.00)
GFR calc Af Amer: >60 mL/min (ref >60)
Glucose, Bld: 93 mg/dL (ref 65–99)
POTASSIUM: 3.8 mmol/L (ref 3.5–5.1)
SODIUM: 139 mmol/L (ref 135–145)

## 2016-04-28 LAB — I-STAT TROPONIN, ED: Troponin i, poc: 0 ng/mL (ref 0.00–0.08)

## 2016-04-28 LAB — LIPASE, BLOOD: LIPASE: 24 U/L (ref 11–51)

## 2016-04-28 MED ORDER — ACETAMINOPHEN 325 MG PO TABS
650.0000 mg | ORAL_TABLET | Freq: Once | ORAL | Status: AC
  Administered 2016-04-28: 650 mg via ORAL
  Filled 2016-04-28: qty 2

## 2016-04-28 MED ORDER — FAMOTIDINE 20 MG PO TABS: 20.0000 mg | ORAL_TABLET | Freq: Two times a day (BID) | ORAL | Status: DC

## 2016-04-28 MED ORDER — GI COCKTAIL ~~LOC~~
30.0000 mL | Freq: Once | ORAL | Status: AC
  Administered 2016-04-28: 30 mL via ORAL
  Filled 2016-04-28: qty 30

## 2016-04-28 NOTE — ED Notes (Signed)
PT DISCHARGED. INSTRUCTIONS AND PRESCRIPTION GIVEN. AAOX3. PT IN NO APPARENT DISTRESS OR PAIN. THE OPPORTUNITY TO ASK QUESTIONS WAS PROVIDED. 

## 2016-04-28 NOTE — ED Notes (Signed)
Pt c/o fever up to 100.8, lightheadedness, pain to anterior and lateral head, neck, epigastric abdominal pain radiating to left flank. No nuchal rigidity.

## 2016-04-29 NOTE — ED Provider Notes (Signed)
CSN: [id number]     Arrival date & time 04/28/16  1815 History   First MD Initiated Contact with Patient 04/28/16 1900     Chief Complaint  Patient presents with  . Abdominal Pain    HPI Comments: 60 year old female who presents with a fever, headache, abdominal pain, and generalized malaise. PMH significant morbid obesity, back pain, fibromyalgia, HTN, diverticulosis. She states she started to feel bad last night and when she woke up this morning she had a frontal headache which radiated to the back of her head. She reports associated fever (100.1 at home), dizziness when she goes from sitting to standing, and sinus congestion. She has been taking tylenol with moderate relief. She is also reporting abdominal pain and alternating constipation and diarrhea which seems to be an ongoing issue since she has had this for several weeks on review of EMR. She has already had an acute chest and abdomen series with was negative along with RUQ [place] which showed a fatty liver but no gallstones or evidence of cholecystitis. She already has an appointment with [facility] in June. Denies chest pain, SOB, nausea, vomiting, dysuria. Diverticulosis, NASH, No gallstones.  Patient is a 60 y.o. female presenting with abdominal pain.  Abdominal Pain Associated symptoms: constipation, diarrhea, fatigue and fever   Associated symptoms: no chest pain, no chills, no dysuria, no nausea and no shortness of breath     Past Medical History  Diagnosis Date  . Acute lymphadenitis 08/13/2009  . Acute pharyngitis 02/10/2010  . ALLERGIC RHINITIS 08/10/2007  . ANXIETY 12/06/2007  . BACK PAIN 12/06/2007  . CHEST PAIN 08/13/2009  . DEPRESSION 12/06/2007  . DIVERTICULOSIS, [place] 12/06/2007  . GERD 12/06/2007  . HYPERTENSION 12/06/2007  . LOW BACK PAIN 12/06/2007  . MRSA 02/07/2009  . Pain in joint, multiple sites 04/21/2010  . PARONYCHIA, FINGER 08/13/2009  . RASH-NONVESICULAR 05/09/2008  . SHOULDER PAIN, LEFT 05/09/2008  . SINUSITIS-  ACUTE-NOS 05/09/2008  . THORACIC/LUMBOSACRAL NEURITIS/RADICULITIS UNSPEC 12/28/2008  . Lumbar disc disease 03/09/2012  . Cervical disc disease 03/09/2012  . Chronic pain 03/09/2012  . Impaired glucose tolerance 03/13/2012  . Cellulitis   . Atherosclerotic peripheral vascular disease ([place]) 06/13/2013    Aorta on CT June 2014   Past Surgical History  Procedure Laterality Date  . Abdominal hysterectomy    . S/p ovary cyst    . S/p right knee arthroscopy      Dr. Mardelle Matte ortho  . Cervical disc surgery     Family History  Problem Relation Age of Onset  . Heart disease Mother   . Hypertension Mother   . Diabetes Mother   . Heart failure Mother   . Asthma Sister   . Asthma Daughter   . Cancer Maternal Uncle     [place]  . Cancer Other     ovarian  . Hypertension Father    Social History  Substance Use Topics  . Smoking status: Former Smoker -- 30 years    Quit date: 11/08/2008  . Smokeless tobacco: Never Used     Comment: quit 10/09  . Alcohol Use: No   OB History    No data available     Review of Systems  Constitutional: Positive for fever and fatigue. Negative for chills.  HENT: Positive for congestion.   Respiratory: Negative for shortness of breath.   Cardiovascular: Negative for chest pain.  Gastrointestinal: Positive for abdominal pain, diarrhea and constipation. Negative for nausea.  Genitourinary: Negative for dysuria.  Musculoskeletal: Negative for gait problem.  Neurological: Positive for dizziness, light-headedness and headaches. Negative for syncope and numbness.  All other systems reviewed and are negative.   Allergies  Adhesive; Amoxicillin-pot clavulanate; Codeine; Lactose intolerance (gi); Morphine and related; Vicodin; and Latex  Home Medications   Prior to Admission medications   Medication Sig Start Date End Date Taking? Authorizing Provider  acetaminophen (TYLENOL) 500 MG tablet Take 1,000 mg by mouth every 6 (six) hours as needed for moderate pain or  headache.   Yes Historical Provider, MD  cyclobenzaprine (FLEXERIL) 10 MG tablet Take 10 mg by mouth daily.  02/07/15  Yes Historical Provider, MD  fluticasone (FLONASE) 50 MCG/ACT nasal spray Place 1 spray into the nose at bedtime.  03/07/15  Yes Historical Provider, MD  furosemide (LASIX) 20 MG tablet Take 20 mg by mouth daily as needed for fluid.  06/03/15  Yes Historical Provider, MD  hydroxypropyl methylcellulose / hypromellose (ISOPTO TEARS / GONIOVISC) 2.5 % ophthalmic solution Place 1 drop into both eyes 3 (three) times daily as needed for dry eyes.   Yes Historical Provider, MD  losartan (COZAAR) 50 MG tablet Take 50 mg by mouth at bedtime.  12/05/15  Yes Historical Provider, MD  ondansetron (ZOFRAN ODT) 4 MG disintegrating tablet Take 1 tablet (4 mg total) by mouth every 8 (eight) hours as needed for nausea. 06/30/15  Yes Noemi Chapel, MD  Oxycodone HCl 10 MG TABS Take 10 mg by mouth 4 (four) times daily. Pain. 12/11/15  Yes Historical Provider, MD  pantoprazole (PROTONIX) 40 MG tablet Take 40 mg by mouth daily. 12/06/15  Yes Historical Provider, MD  rOPINIRole (REQUIP) 1 MG tablet Take 1 mg by mouth at bedtime.  04/01/16  Yes Historical Provider, MD  sertraline (ZOLOFT) 50 MG tablet Take 50 mg by mouth at bedtime.    Yes Historical Provider, MD  triamcinolone cream (KENALOG) 0.1 % Apply 1 application topically 2 (two) times daily as needed (rash).  06/03/15  Yes Historical Provider, MD  famotidine (PEPCID) 20 MG tablet Take 1 tablet (20 mg total) by mouth 2 (two) times daily. 04/28/16   Recardo Evangelist, PA-C   BP 133/72 mmHg  Pulse 91  Temp(Src) 99 F (37.2 C) (Oral)  Resp 20  SpO2 94%   Physical Exam  Constitutional: She is oriented to person, place, and time. She appears well-developed and well-nourished. No distress.  HENT:  Head: Normocephalic and atraumatic.  Right Ear: Hearing, tympanic membrane, external ear and ear canal normal.  Left Ear: Hearing, tympanic membrane, external  ear and ear canal normal.  Nose: No mucosal edema or rhinorrhea.  Mouth/Throat: Uvula is midline, oropharynx is clear and moist and mucous membranes are normal.  Eyes: Conjunctivae are normal. Pupils are equal, round, and reactive to light. Right eye exhibits no discharge. Left eye exhibits no discharge. No scleral icterus.  Neck: Normal range of motion. Neck supple.  No nuchal rigidity  Cardiovascular: Normal rate, regular rhythm and intact distal pulses.  Exam reveals no gallop and no friction rub.   No murmur heard. Pulmonary/Chest: Breath sounds normal. No respiratory distress. She has no wheezes. She has no rales. She exhibits no tenderness.  Abdominal: Soft. Bowel sounds are normal. She exhibits no distension and no mass. There is tenderness. There is no rebound and no guarding.  Generalized tenderness however when she points with one finger she points to epigastric area  Lymphadenopathy:    She has no cervical adenopathy.  Neurological: She is alert  and oriented to person, place, and time.  Mental Status:  Alert, oriented, thought content appropriate, able to give a coherent history. Speech fluent without evidence of aphasia. Able to follow 2 step commands without difficulty.  Cranial Nerves:  II:  Peripheral visual fields grossly normal, pupils equal, round, reactive to light III,IV, VI: ptosis not present, extra-ocular motions intact bilaterally  V,VII: smile symmetric, facial light touch sensation equal VIII: hearing grossly normal to voice  X: uvula elevates symmetrically  XI: bilateral shoulder shrug symmetric and strong XII: midline tongue extension without fassiculations Motor:  Cerebellar: normal finger-to-nose with bilateral upper extremities Gait: normal gait and balance    Skin: Skin is warm and dry.  Psychiatric: She has a normal mood and affect.  Vitals reviewed.   ED Course  Procedures (including critical care time) Labs Review Labs Reviewed  BASIC METABOLIC  PANEL - Abnormal; Notable for the following:    Chloride 100 (*)    All other components within normal limits  HEPATIC FUNCTION PANEL - Abnormal; Notable for the following:    Total Bilirubin 1.3 (*)    Indirect Bilirubin 1.1 (*)    All other components within normal limits  CBC  LIPASE, BLOOD  URINALYSIS, ROUTINE W REFLEX MICROSCOPIC (NOT AT [facility])  I-STAT TROPOININ, ED    Imaging Review No results found. I have personally reviewed and evaluated these images and lab results as part of my medical decision-making.   EKG Interpretation None     Meds given in ED:  Medications  acetaminophen (TYLENOL) tablet 650 mg (650 mg Oral Given 04/28/16 2026)  gi cocktail (Maalox,Lidocaine,Donnatal) (30 mLs Oral Given 04/28/16 2027)    Discharge Medication List as of 04/28/2016  9:19 PM    START taking these medications   Details  famotidine (PEPCID) 20 MG tablet Take 1 tablet (20 mg total) by mouth 2 (two) times daily., Starting 04/28/2016, Until Discontinued, Print         MDM   Final diagnoses:  Acute frontal sinusitis, recurrence not specified  Acute nonintractable headache, unspecified headache type  Epigastric abdominal pain   60 year old female who presents with multiple medical complaints and a fever. She has a low grade temp on arrival of 99.9. Tylenol given which has improved her temp to 99. All other vitals reviewed are stable and WNL.   Patient has a lot of chronic issues however her fever, sinus tenderness, and headache seem to be new. Her symptoms have only going on for a day so most likely viral etiology at this point. No red flag headache signs. Normal neuro exam. No indication for imaging at this time. Checked orthostatic vitals because of dizziness which were negative. UA was clean. Troponin was 0. BMP and CBC were unremarkable. Lipase normal. LFTs show isolated high bilirubin which was the same one month ago.   Her epigastric pain is chronic. Tried a GI cocktail after  which patient expresses relief. Encouraged her to keep her appointment with GI, stay on her Protonix once daily and add Pepcid daily. Advised Tylenol for her pain and fever since she is having epigastric pain. Patient is NAD, non-toxic, with stable VS. Patient is informed of clinical course, understands medical decision making process, and agrees with plan. Opportunity for questions provided and all questions answered. Return precautions given.       Recardo Evangelist, PA-C 04/29/16 1227  Milton Ferguson, MD 04/29/16 [phone]

## 2016-05-26 ENCOUNTER — Other Ambulatory Visit: Payer: Medicare Other

## 2016-05-26 ENCOUNTER — Ambulatory Visit (INDEPENDENT_AMBULATORY_CARE_PROVIDER_SITE_OTHER): Payer: Medicare Other | Admitting: Gastroenterology

## 2016-05-26 ENCOUNTER — Encounter: Payer: Self-pay | Admitting: Gastroenterology

## 2016-05-26 VITALS — BP 118/70 | HR 80 | Ht 63.0 in | Wt 349.0 lb

## 2016-05-26 DIAGNOSIS — R1013 Epigastric pain: Secondary | ICD-10-CM

## 2016-05-26 DIAGNOSIS — R1310 Dysphagia, unspecified: Secondary | ICD-10-CM

## 2016-05-26 DIAGNOSIS — Z1211 Encounter for screening for malignant neoplasm of colon: Secondary | ICD-10-CM | POA: Diagnosis not present

## 2016-05-26 DIAGNOSIS — K760 Fatty (change of) liver, not elsewhere classified: Secondary | ICD-10-CM

## 2016-05-26 MED ORDER — NA SULFATE-K SULFATE-MG SULF 17.5-3.13-1.6 GM/177ML PO SOLN: 1.0000 | Freq: Once | ORAL | Status: DC

## 2016-05-26 MED ORDER — PANTOPRAZOLE SODIUM 40 MG PO TBEC: 40.0000 mg | DELAYED_RELEASE_TABLET | Freq: Two times a day (BID) | ORAL | Status: DC

## 2016-05-26 NOTE — Progress Notes (Signed)
HPI :  60 y/o female seen here for a new patient evaluation for abdominal pain. She has multiple medical problems as outlined below.   Patient has had some epigastric pain which comes and goes for years. She states it feels like a contraction. She reports being to the ER 4 times for this type of pain historically  This has been ongoing for a few years. She has had episodes once every 3-4 months. Pain  usually bothers her after she eats. She has tried to avoid fatty foods at it can cause symptoms. She reports onset of symptoms roughly within a few hours of eating. Pain is in the epigastric area. She reports abdominal distension with her pain. She has vomiting with this symptom when she has pain. She reports having had elevated WBC and fevers at times when this occurs. Pain lasts for upwards of 5 hours and then resolves. Pain can radiate to the LUQ. Pain does not radiate to the shoulders / back. She usually will have vomiting with this. She has had diarrhea with this once. She denies frequent NSAID use. She takes percocet roughly 4 per day. She denies any frequent nausea or vomiting. She does endorse some ocasional dysphagia to the sternal notch. She reports this longstanding. 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 She had an US of the abdomen which showed fatty liver but no gallstones.  She denies any alcohol use. No tobacco use. She thinks she may have gained some weight recently. No FH of liver disease. She takes protonix 40mg  once daily which controls heartburn.   Uncle had [place] cancer. Last colonoscopy was in 2003 which showed diverticulosis. She denies any blood in the stools. She reports some baseline constipation at times. Lately she reports her stools have been fairly regular.   She has had 4 CT scans since 2013 in the setting of these symptoms without bowel obstruction noted.   Past Medical History  Diagnosis Date  . Acute  lymphadenitis 08/13/2009  . Acute pharyngitis 02/10/2010  . ALLERGIC RHINITIS 08/10/2007  . ANXIETY 12/06/2007  . BACK PAIN 12/06/2007  . CHEST PAIN 08/13/2009  . DEPRESSION 12/06/2007  . DIVERTICULOSIS, [place] 12/06/2007  . GERD 12/06/2007  . HYPERTENSION 12/06/2007  . LOW BACK PAIN 12/06/2007  . MRSA 02/07/2009  . Pain in joint, multiple sites 04/21/2010  . PARONYCHIA, FINGER 08/13/2009  . RASH-NONVESICULAR 05/09/2008  . SHOULDER PAIN, LEFT 05/09/2008  . SINUSITIS- ACUTE-NOS 05/09/2008  . THORACIC/LUMBOSACRAL NEURITIS/RADICULITIS UNSPEC 12/28/2008  . Lumbar disc disease 03/09/2012  . Cervical disc disease 03/09/2012  . Chronic pain 03/09/2012  . Impaired glucose tolerance 03/13/2012  . Cellulitis   . Atherosclerotic peripheral vascular disease ([place]) 06/13/2013    Aorta on CT June 2014  . Hepatitis     age 43 hepatitis A     Past Surgical History  Procedure Laterality Date  . Abdominal hysterectomy    . S/p ovary cyst    . S/p right knee arthroscopy      Dr. Mardelle Matte ortho  . Cervical disc surgery     Family History  Problem Relation Age of Onset  . Heart disease Mother   . Hypertension Mother   . Diabetes Mother   . Heart failure Mother   . Asthma Sister   . Asthma Daughter   . Cancer Maternal Uncle     [place]  . Cancer Other     ovarian  .  Hypertension Father   . Liver cancer Paternal Grandmother    Social History  Substance Use Topics  . Smoking status: Former Smoker -- 30 years    Quit date: 11/08/2008  . Smokeless tobacco: Never Used     Comment: quit 10/09  . Alcohol Use: No   Current Outpatient Prescriptions  Medication Sig Dispense Refill  . acetaminophen (TYLENOL) 500 MG tablet Take 1,000 mg by mouth every 6 (six) hours as needed for moderate pain or headache.    . cyclobenzaprine (FLEXERIL) 10 MG tablet Take 10 mg by mouth daily.     . famotidine (PEPCID) 20 MG tablet Take 1 tablet (20 mg total) by mouth 2 (two) times daily. 30 tablet 0  . fluticasone (FLONASE)  50 MCG/ACT nasal spray Place 1 spray into the nose at bedtime.     . furosemide (LASIX) 20 MG tablet Take 20 mg by mouth daily as needed for fluid.     . hydroxypropyl methylcellulose / hypromellose (ISOPTO TEARS / GONIOVISC) 2.5 % ophthalmic solution Place 1 drop into both eyes 3 (three) times daily as needed for dry eyes.    Marland Kitchen losartan (COZAAR) 50 MG tablet Take 50 mg by mouth at bedtime.     . ondansetron (ZOFRAN ODT) 4 MG disintegrating tablet Take 1 tablet (4 mg total) by mouth every 8 (eight) hours as needed for nausea. 10 tablet 0  . Oxycodone HCl 10 MG TABS Take 10 mg by mouth 4 (four) times daily. Pain.  0  . pantoprazole (PROTONIX) 40 MG tablet Take 40 mg by mouth daily.    Marland Kitchen rOPINIRole (REQUIP) 1 MG tablet Take 1 mg by mouth at bedtime.     . sertraline (ZOLOFT) 50 MG tablet Take 50 mg by mouth at bedtime.     . triamcinolone cream (KENALOG) 0.1 % Apply 1 application topically 2 (two) times daily as needed (rash).     . [DISCONTINUED] DULoxetine (CYMBALTA) 60 MG capsule Take 60 mg by mouth daily.      . [DISCONTINUED] fexofenadine (ALLEGRA) 180 MG tablet Take 180 mg by mouth daily.       No current facility-administered medications for this visit.   Allergies  Allergen Reactions  . Adhesive [Tape] Other (See Comments)    Tears skin off - use paper tape   . Amoxicillin-Pot Clavulanate Nausea And Vomiting  . Codeine Other (See Comments)    hallucinations  . Lactose Intolerance (Gi) Nausea And Vomiting  . Morphine And Related Nausea And Vomiting and Other (See Comments)    headaches  . Vicodin [Hydrocodone-Acetaminophen] Other (See Comments)    hallucinations  . Latex Rash     Review of Systems: All systems reviewed and negative except where noted in HPI.   Lab Results  Component Value Date   WBC 9.6 04/28/2016   HGB 14.1 04/28/2016   HCT 42.1 04/28/2016   MCV 90.0 04/28/2016   PLT 261 04/28/2016   Lab Results  Component Value Date   ALT 33 04/28/2016   AST 30  04/28/2016   ALKPHOS 66 04/28/2016   BILITOT 1.3* 04/28/2016    Lab Results  Component Value Date   CREATININE 0.94 04/28/2016   BUN 6 04/28/2016   NA 139 04/28/2016   K 3.8 04/28/2016   CL 100* 04/28/2016   CO2 27 04/28/2016     Physical Exam: BP 118/70 mmHg  Pulse 80  Ht 5\' 3"  (1.6 m)  Wt 349 lb (158.305 kg)  BMI 61.84 kg/m2  Constitutional: Pleasant,well-developed, female in no acute distress. HEENT: Normocephalic and atraumatic. Conjunctivae are normal. No scleral icterus. Neck supple.  Cardiovascular: Normal rate, regular rhythm.  Pulmonary/chest: Effort normal and breath sounds normal. No wheezing, rales or rhonchi. Abdominal: Soft, nondistended, protuberant, nontender. Bowel sounds active throughout. There are no masses palpable. No hepatomegaly. Extremities: no edema Lymphadenopathy: No cervical adenopathy noted. Neurological: Alert and oriented to person place and time. Skin: Skin is warm and dry. No rashes noted. Psychiatric: Normal mood and affect. Behavior is normal.   ASSESSMENT AND PLAN: 60 y/o female with PMH as outlined above here to be evaluated for the following:  Epigastric pain - US shows no gallstones, symptoms a bit atypical for biliary colic although unclear etiology given they are so intermittent. Multiple CT imaging studies without a clear etiology, although one study showed some changes concerning for gastroenteritis. No obstructive pathology noted nor pancreatitis. I offered her an EGD to ensure normal, rule out H pylori, etc. I discussed risks / benefits of endoscopy and she wished to proceed. We can consider HIDA pending course but seems a bit atypical for biliary colic. Recommend she try increasing protonix to BID in the interim to see if this helps at all. Of note, given her weight, case must be done at the hospital with anesthesia support.   Dysphagia - longstanding, as above, prior benefit with remote EGD with dilation. I offered her EGD as  above to evaluate both of these issues.   [place] cancer screening - overdue for colonoscopy, due for screening. I discussed options for her, following discussion of risks / benefits she wished to proceed with optical colonoscopy.   Fatty liver - counseled patient on fatty liver, long term risks of NASH and cirrhosis. ALT is normal at this time. Will perform viral hep screening to ensure negative, and immunize PRN. Recommend weight loss with diet / exercise, routine coffee intake can help prevent fibrotic changes. She should continue to abstain from alcohol. Prior screening for DM negative. Follow up yearly with LFTs for this issue.   [place] Marina Cellar, MD [facility] Pager [phone]  CC: Leamon Arnt, MD

## 2016-05-26 NOTE — Patient Instructions (Signed)
Your physician has requested that you go to the basement for lab work before leaving today.  You have been scheduled for an endoscopy and colonoscopy. Please follow the written instructions given to you at your visit today. Please pick up your prep supplies at the pharmacy within the next 1-3 days. If you use inhalers (even only as needed), please bring them with you on the day of your procedure. Your physician has requested that you go to www.[email or web address].com and enter the access code given to you at your visit today. This web site gives a general overview about your procedure. However, you should still follow specific instructions given to you by our office regarding your preparation for the procedure.  Please increase your Protonix 40 mg to one tablet by mouth twice daily. A new prescription has been sent to your pharmacy.

## 2016-05-27 ENCOUNTER — Other Ambulatory Visit: Payer: Self-pay | Admitting: *Deleted

## 2016-05-27 DIAGNOSIS — K760 Fatty (change of) liver, not elsewhere classified: Secondary | ICD-10-CM

## 2016-05-27 LAB — HEPATITIS B SURFACE ANTIBODY,QUALITATIVE: HEP B S AB: NEGATIVE

## 2016-05-27 LAB — HEPATITIS B SURFACE ANTIGEN: Hepatitis B Surface Ag: NEGATIVE

## 2016-05-27 LAB — HEPATITIS A ANTIBODY, TOTAL: HEP A TOTAL AB: REACTIVE — AB

## 2016-05-27 LAB — HEPATITIS C ANTIBODY: HCV Ab: NEGATIVE

## 2016-05-28 ENCOUNTER — Encounter (HOSPITAL_COMMUNITY): Payer: Self-pay | Admitting: Nurse Practitioner

## 2016-05-28 ENCOUNTER — Emergency Department (HOSPITAL_COMMUNITY)
Admission: EM | Admit: 2016-05-28 | Discharge: 2016-05-29 | Disposition: A | Discharge status: Home / Self Care | Payer: Medicare Other | Attending: Emergency Medicine | Admitting: Emergency Medicine

## 2016-05-28 DIAGNOSIS — R509 Fever, unspecified: Secondary | ICD-10-CM | POA: Insufficient documentation

## 2016-05-28 DIAGNOSIS — I10 Essential (primary) hypertension: Secondary | ICD-10-CM | POA: Insufficient documentation

## 2016-05-28 DIAGNOSIS — F329 Major depressive disorder, single episode, unspecified: Secondary | ICD-10-CM | POA: Insufficient documentation

## 2016-05-28 DIAGNOSIS — Z87891 Personal history of nicotine dependence: Secondary | ICD-10-CM | POA: Insufficient documentation

## 2016-05-28 DIAGNOSIS — Z91040 Latex allergy status: Secondary | ICD-10-CM | POA: Diagnosis not present

## 2016-05-28 DIAGNOSIS — M791 Myalgia, unspecified site: Secondary | ICD-10-CM

## 2016-05-28 LAB — CBC WITH DIFFERENTIAL/PLATELET
Basophils Absolute: 0 10*3/uL (ref 0.0–0.1)
Basophils Relative: 0 %
EOS ABS: 0.1 10*3/uL (ref 0.0–0.7)
EOS PCT: 1 %
HCT: 38.6 % (ref 36.0–46.0)
Hemoglobin: 13 g/dL (ref 12.0–15.0)
LYMPHS ABS: 2.6 10*3/uL (ref 0.7–4.0)
LYMPHS PCT: 28 %
MCH: 29.6 pg (ref 26.0–34.0)
MCHC: 33.7 g/dL (ref 30.0–36.0)
MCV: 87.9 fL (ref 78.0–100.0)
MONO ABS: 0.7 10*3/uL (ref 0.1–1.0)
Monocytes Relative: 8 %
Neutro Abs: 5.9 10*3/uL (ref 1.7–7.7)
Neutrophils Relative %: 63 %
PLATELETS: 231 10*3/uL (ref 150–400)
RBC: 4.39 MIL/uL (ref 3.87–5.11)
RDW: 12.5 % (ref 11.5–15.5)
WBC: 9.4 10*3/uL (ref 4.0–10.5)

## 2016-05-28 LAB — POTASSIUM: POTASSIUM: 3.4 mmol/L — AB (ref 3.5–5.1)

## 2016-05-28 LAB — I-STAT CHEM 8, ED
BUN: 8 mg/dL (ref 6–20)
CALCIUM ION: 0.98 mmol/L — AB (ref 1.12–1.23)
Chloride: 98 mmol/L — ABNORMAL LOW (ref 101–111)
Creatinine, Ser: 1.1 mg/dL — ABNORMAL HIGH (ref 0.44–1.00)
GLUCOSE: 99 mg/dL (ref 65–99)
HCT: 39 % (ref 36.0–46.0)
HEMOGLOBIN: 13.3 g/dL (ref 12.0–15.0)
Potassium: 5.8 mmol/L — ABNORMAL HIGH (ref 3.5–5.1)
SODIUM: 136 mmol/L (ref 135–145)
TCO2: 32 mmol/L (ref 0–100)

## 2016-05-28 LAB — URINALYSIS, ROUTINE W REFLEX MICROSCOPIC
BILIRUBIN URINE: NEGATIVE
Glucose, UA: NEGATIVE mg/dL
HGB URINE DIPSTICK: NEGATIVE
KETONES UR: NEGATIVE mg/dL
Leukocytes, UA: NEGATIVE
NITRITE: NEGATIVE
PROTEIN: NEGATIVE mg/dL
SPECIFIC GRAVITY, URINE: 1.015 (ref 1.005–1.030)
pH: 6 (ref 5.0–8.0)

## 2016-05-28 LAB — I-STAT CG4 LACTIC ACID, ED: LACTIC ACID, VENOUS: 1.66 mmol/L (ref 0.5–2.0)

## 2016-05-28 MED ORDER — SODIUM CHLORIDE 0.9 % IV BOLUS (SEPSIS)
1000.0000 mL | Freq: Once | INTRAVENOUS | Status: AC
  Administered 2016-05-28: 1000 mL via INTRAVENOUS

## 2016-05-28 MED ORDER — KETOROLAC TROMETHAMINE 30 MG/ML IJ SOLN
30.0000 mg | Freq: Once | INTRAMUSCULAR | Status: AC
  Administered 2016-05-28: 30 mg via INTRAVENOUS
  Filled 2016-05-28: qty 1

## 2016-05-28 NOTE — ED Notes (Signed)
Bed: WLPT2 Expected date:  Expected time:  Means of arrival:  Comments: Pt in room 

## 2016-05-28 NOTE — ED Provider Notes (Signed)
CSN: [id number]     Arrival date & time 05/28/16  2055 History   First MD Initiated Contact with Patient 05/28/16 2218     Chief Complaint  Patient presents with  . Weakness  . Fever  . Malaise      (Consider location/radiation/quality/duration/timing/severity/associated sxs/prior Treatment) HPI Comments: This s a morbidly obese female with a number of chronic medical issues including chronic pain who presents with fever to 103 today took tylenol X 2 as well as her normal 10 milligram Oxycodone with little relief. Denies cough, URI or UTI, diarrhea, constipation sore throat   Patient is a 60 y.o. female presenting with weakness and fever. The history is provided by the patient.  Weakness This is a new problem. The current episode started today. The problem occurs constantly. Associated symptoms include arthralgias, fatigue, a fever, myalgias and weakness. Pertinent negatives include no abdominal pain, chest pain, chills, coughing, nausea, neck pain, sore throat, swollen glands, urinary symptoms, vertigo, visual change or vomiting. Nothing aggravates the symptoms. She has tried acetaminophen for the symptoms. The treatment provided no relief.  Fever Associated symptoms: myalgias   Associated symptoms: no chest pain, no chills, no cough, no diarrhea, no dysuria, no nausea, no sore throat and no vomiting     Past Medical History  Diagnosis Date  . Acute lymphadenitis 08/13/2009  . Acute pharyngitis 02/10/2010  . ALLERGIC RHINITIS 08/10/2007  . ANXIETY 12/06/2007  . BACK PAIN 12/06/2007  . CHEST PAIN 08/13/2009  . DEPRESSION 12/06/2007  . DIVERTICULOSIS, [place] 12/06/2007  . GERD 12/06/2007  . HYPERTENSION 12/06/2007  . LOW BACK PAIN 12/06/2007  . MRSA 02/07/2009  . Pain in joint, multiple sites 04/21/2010  . PARONYCHIA, FINGER 08/13/2009  . RASH-NONVESICULAR 05/09/2008  . SHOULDER PAIN, LEFT 05/09/2008  . SINUSITIS- ACUTE-NOS 05/09/2008  . THORACIC/LUMBOSACRAL NEURITIS/RADICULITIS UNSPEC  12/28/2008  . Lumbar disc disease 03/09/2012  . Cervical disc disease 03/09/2012  . Chronic pain 03/09/2012  . Impaired glucose tolerance 03/13/2012  . Cellulitis   . Atherosclerotic peripheral vascular disease ([place]) 06/13/2013    Aorta on CT June 2014  . Hepatitis     age 24 hepatitis A   Past Surgical History  Procedure Laterality Date  . Abdominal hysterectomy    . S/p ovary cyst    . S/p right knee arthroscopy      Dr. Mardelle Matte ortho  . Cervical disc surgery     Family History  Problem Relation Age of Onset  . Heart disease Mother   . Hypertension Mother   . Diabetes Mother   . Heart failure Mother   . Asthma Sister   . Asthma Daughter   . Cancer Maternal Uncle     Dhillon  . Cancer Other     ovarian  . Hypertension Father   . Liver cancer Paternal Grandmother    Social History  Substance Use Topics  . Smoking status: Former Smoker -- 30 years    Quit date: 11/08/2008  . Smokeless tobacco: Never Used     Comment: quit 10/09  . Alcohol Use: No   OB History    No data available     Review of Systems  Constitutional: Positive for fever and fatigue. Negative for chills.  HENT: Negative for sore throat.   Respiratory: Negative for cough.   Cardiovascular: Negative for chest pain.  Gastrointestinal: Negative for nausea, vomiting, abdominal pain, diarrhea and constipation.  Genitourinary: Negative for dysuria and frequency.  Musculoskeletal: Positive for myalgias and arthralgias. Negative for  neck pain and neck stiffness.  Neurological: Positive for weakness. Negative for vertigo.  All other systems reviewed and are negative.     Allergies  Adhesive; Amoxicillin-pot clavulanate; Codeine; Lactose intolerance (gi); Morphine and related; Vicodin; and Latex  Home Medications   Prior to Admission medications   Medication Sig Start Date End Date Taking? Authorizing Provider  acetaminophen (TYLENOL) 500 MG tablet Take 1,000 mg by mouth every 6 (six) hours as needed for  moderate pain or headache.   Yes Historical Provider, MD  ALPRAZolam Duanne Moron) 0.5 MG tablet Take 0.5 mg by mouth 2 (two) times daily as needed for anxiety or sleep.  05/28/16  Yes Historical Provider, MD  cyclobenzaprine (FLEXERIL) 10 MG tablet Take 10 mg by mouth daily.  02/07/15  Yes Historical Provider, MD  fluticasone (FLONASE) 50 MCG/ACT nasal spray Place 1 spray into the nose at bedtime.  03/07/15  Yes Historical Provider, MD  furosemide (LASIX) 20 MG tablet Take 20 mg by mouth daily as needed for fluid.  06/03/15  Yes Historical Provider, MD  hydroxypropyl methylcellulose / hypromellose (ISOPTO TEARS / GONIOVISC) 2.5 % ophthalmic solution Place 1 drop into both eyes 3 (three) times daily as needed for dry eyes.   Yes Historical Provider, MD  losartan (COZAAR) 50 MG tablet Take 50 mg by mouth daily.  12/05/15  Yes Historical Provider, MD  ondansetron (ZOFRAN ODT) 4 MG disintegrating tablet Take 1 tablet (4 mg total) by mouth every 8 (eight) hours as needed for nausea. 06/30/15  Yes Noemi Chapel, MD  Oxycodone HCl 10 MG TABS Take 10 mg by mouth 4 (four) times daily. Pain. 12/11/15  Yes Historical Provider, MD  pantoprazole (PROTONIX) 40 MG tablet Take 1 tablet (40 mg total) by mouth 2 (two) times daily. 05/26/16  Yes Manus Gunning, MD  rOPINIRole (REQUIP) 1 MG tablet Take 1 mg by mouth at bedtime.  04/01/16  Yes Historical Provider, MD  sertraline (ZOLOFT) 50 MG tablet Take 50 mg by mouth at bedtime.    Yes Historical Provider, MD  triamcinolone cream (KENALOG) 0.1 % Apply 1 application topically 2 (two) times daily as needed (rash).  06/03/15  Yes Historical Provider, MD  famotidine (PEPCID) 20 MG tablet Take 1 tablet (20 mg total) by mouth 2 (two) times daily. Patient not taking: Reported on 05/28/2016 04/28/16   Recardo Evangelist, PA-C  Na Sulfate-K Sulfate-Mg Sulf 17.5-3.13-1.6 GM/180ML SOLN Take 1 kit by mouth once. Patient not taking: Reported on 05/28/2016 05/26/16   Manus Gunning, MD    BP 117/60 mmHg  Pulse 80  Temp(Src) 98.6 F (37 C) (Oral)  Resp 20  SpO2 93% Physical Exam  Constitutional: She is oriented to person, place, and time. She appears well-developed and well-nourished.  HENT:  Head: Normocephalic.  Mouth/Throat: Oropharynx is clear and moist.  Eyes: Pupils are equal, round, and reactive to light.  Neck: Normal range of motion.  Cardiovascular: Normal rate and regular rhythm.   Pulmonary/Chest: Effort normal and breath sounds normal.  Abdominal: Soft. She exhibits no distension.  Exam limited due to obesity  Musculoskeletal: Normal range of motion.  Lymphadenopathy:    She has no cervical adenopathy.  Neurological: She is alert and oriented to person, place, and time.  Skin: Skin is warm and dry.  Nursing note and vitals reviewed.   ED Course  Procedures (including critical care time) Labs Review Labs Reviewed  URINALYSIS, ROUTINE W REFLEX MICROSCOPIC (NOT AT [facility]) - Abnormal; Notable for the following:  APPearance CLOUDY (*)    All other components within normal limits  POTASSIUM - Abnormal; Notable for the following:    Potassium 3.4 (*)    All other components within normal limits  I-STAT CHEM 8, ED - Abnormal; Notable for the following:    Potassium 5.8 (*)    Chloride 98 (*)    Creatinine, Ser 1.10 (*)    Calcium, Ion 0.98 (*)    All other components within normal limits  CBC WITH DIFFERENTIAL/PLATELET  I-STAT CG4 LACTIC ACID, ED  I-STAT CG4 LACTIC ACID, ED    Imaging Review Dg Chest 2 View  05/29/2016  CLINICAL DATA:  Weakness and fatigue.  Fever EXAM: CHEST  2 VIEW COMPARISON:  03/24/2016 FINDINGS: Limited low volume chest without convincing pneumonia. Haziness over the right hilum is likely from soft tissue attenuation. No edema, effusion, or pneumothorax. Mild cardiomegaly. IMPRESSION: 1. Low volume chest without convincing pneumonia. 2. Mild cardiomegaly. Electronically Signed   By: Monte Fantasia M.D.   On: 05/29/2016  03:29   I have personally reviewed and evaluated these images and lab results as part of my medical decision-making.   EKG Interpretation None      MDM   Final diagnoses:  Fever, unspecified fever cause  Myalgia         Junius Creamer, NP 05/29/16 [place], MD 06/01/16 [phone]

## 2016-05-28 NOTE — ED Notes (Signed)
Pt states she woke up with severe 102.5 and has tried to control it all day with antipyretics without getting relief, she states she "Just doesn't feel good." Also c/o weakness and "feeling dry."

## 2016-05-29 ENCOUNTER — Emergency Department (HOSPITAL_COMMUNITY): Payer: Medicare Other

## 2016-05-29 DIAGNOSIS — R509 Fever, unspecified: Secondary | ICD-10-CM | POA: Diagnosis not present

## 2016-05-29 LAB — I-STAT CG4 LACTIC ACID, ED: LACTIC ACID, VENOUS: 0.77 mmol/L (ref 0.5–2.0)

## 2016-05-29 NOTE — Discharge Instructions (Signed)
Tonight your evaluated for fever and myalgias weakness your labs x-ray urine and EKG are all within normal parameters not showing any sign of infection. Temperature went down with appropriate dose of Tylenol and ibuprofen you can safely alternate this at home and follow up with your primary care physician   Fever, Adult A fever is an increase in the body's temperature. It is usually defined as a temperature of 100F (38C) or higher. Brief mild or moderate fevers generally have no long-term effects, and they often do not require treatment. Moderate or high fevers may make you feel uncomfortable and can sometimes be a sign of a serious illness or disease. The sweating that may occur with repeated or prolonged fever may also cause dehydration. Fever is confirmed by taking a temperature with a thermometer. A measured temperature can vary with:  Age.  Time of day.  Location of the thermometer:  Mouth (oral).  Rectum (rectal).  Ear (tympanic).  Underarm (axillary).  Forehead (temporal). HOME CARE INSTRUCTIONS Pay attention to any changes in your symptoms. Take these actions to help with your condition:  Take over-the counter and prescription medicines only as told by your health care provider. Follow the dosing instructions carefully.  If you were prescribed an antibiotic medicine, take it as told by your health care provider. Do not stop taking the antibiotic even if you start to feel better.  Rest as needed.  Drink enough fluid to keep your urine clear or pale yellow. This helps to prevent dehydration.  Sponge yourself or bathe with room-temperature water to help reduce your body temperature as needed. Do not use ice water.  Do not overbundle yourself in blankets or heavy clothes. SEEK MEDICAL CARE IF:  You vomit.  You cannot eat or drink without vomiting.  You have diarrhea.  You have pain when you urinate.  Your symptoms do not improve with treatment.  You develop new  symptoms.  You develop excessive weakness. SEEK IMMEDIATE MEDICAL CARE IF:  You have shortness of breath or have trouble breathing.  You are dizzy or you faint.  You are disoriented or confused.  You develop signs of dehydration, such as a dry mouth, decreased urination, or paleness.  You develop severe pain in your abdomen.  You have persistent vomiting or diarrhea.  You develop a skin rash.  Your symptoms suddenly get worse.   This information is not intended to replace advice given to you by your health care provider. Make sure you discuss any questions you have with your health care provider.   Document Released: 06/02/2001 Document Revised: 08/28/2015 Document Reviewed: 01/31/2015 Elsevier Interactive Patient Education [employer].

## 2016-05-29 NOTE — ED Notes (Signed)
Pt ambulated in hallway. Pt was steady on her feet

## 2016-06-03 ENCOUNTER — Ambulatory Visit (INDEPENDENT_AMBULATORY_CARE_PROVIDER_SITE_OTHER): Payer: Medicare Other | Admitting: Gastroenterology

## 2016-06-03 DIAGNOSIS — K760 Fatty (change of) liver, not elsewhere classified: Secondary | ICD-10-CM

## 2016-06-03 DIAGNOSIS — Z23 Encounter for immunization: Secondary | ICD-10-CM | POA: Diagnosis not present

## 2016-07-01 DIAGNOSIS — R7303 Prediabetes: Secondary | ICD-10-CM | POA: Insufficient documentation

## 2016-07-02 ENCOUNTER — Encounter (HOSPITAL_COMMUNITY): Payer: Self-pay | Admitting: *Deleted

## 2016-07-10 ENCOUNTER — Encounter (HOSPITAL_COMMUNITY)
Admission: RE | Disposition: A | Discharge status: Home / Self Care | Payer: Self-pay | Source: Ambulatory Visit | Attending: Gastroenterology

## 2016-07-10 ENCOUNTER — Ambulatory Visit (HOSPITAL_COMMUNITY)
Admission: RE | Admit: 2016-07-10 | Discharge: 2016-07-10 | Disposition: A | Discharge status: Home / Self Care | Payer: Medicare Other | Source: Ambulatory Visit | Attending: Gastroenterology | Admitting: Gastroenterology

## 2016-07-10 ENCOUNTER — Ambulatory Visit (HOSPITAL_COMMUNITY): Payer: Medicare Other | Admitting: Anesthesiology

## 2016-07-10 ENCOUNTER — Encounter (HOSPITAL_COMMUNITY): Payer: Self-pay | Admitting: Anesthesiology

## 2016-07-10 DIAGNOSIS — K648 Other hemorrhoids: Secondary | ICD-10-CM | POA: Insufficient documentation

## 2016-07-10 DIAGNOSIS — I739 Peripheral vascular disease, unspecified: Secondary | ICD-10-CM | POA: Diagnosis not present

## 2016-07-10 DIAGNOSIS — K621 Rectal polyp: Secondary | ICD-10-CM | POA: Diagnosis not present

## 2016-07-10 DIAGNOSIS — D120 Benign neoplasm of cecum: Secondary | ICD-10-CM | POA: Diagnosis not present

## 2016-07-10 DIAGNOSIS — J309 Allergic rhinitis, unspecified: Secondary | ICD-10-CM | POA: Diagnosis not present

## 2016-07-10 DIAGNOSIS — K219 Gastro-esophageal reflux disease without esophagitis: Secondary | ICD-10-CM | POA: Diagnosis not present

## 2016-07-10 DIAGNOSIS — D124 Benign neoplasm of descending colon: Secondary | ICD-10-CM | POA: Insufficient documentation

## 2016-07-10 DIAGNOSIS — M1990 Unspecified osteoarthritis, unspecified site: Secondary | ICD-10-CM | POA: Diagnosis not present

## 2016-07-10 DIAGNOSIS — Z6841 Body Mass Index (BMI) 40.0 and over, adult: Secondary | ICD-10-CM | POA: Diagnosis not present

## 2016-07-10 DIAGNOSIS — I10 Essential (primary) hypertension: Secondary | ICD-10-CM | POA: Diagnosis not present

## 2016-07-10 DIAGNOSIS — K5520 Angiodysplasia of colon without hemorrhage: Secondary | ICD-10-CM | POA: Diagnosis not present

## 2016-07-10 DIAGNOSIS — D123 Benign neoplasm of transverse colon: Secondary | ICD-10-CM

## 2016-07-10 DIAGNOSIS — R1013 Epigastric pain: Secondary | ICD-10-CM | POA: Diagnosis present

## 2016-07-10 DIAGNOSIS — Z1211 Encounter for screening for malignant neoplasm of colon: Secondary | ICD-10-CM | POA: Insufficient documentation

## 2016-07-10 DIAGNOSIS — Z79899 Other long term (current) drug therapy: Secondary | ICD-10-CM | POA: Insufficient documentation

## 2016-07-10 DIAGNOSIS — K2950 Unspecified chronic gastritis without bleeding: Secondary | ICD-10-CM | POA: Diagnosis not present

## 2016-07-10 DIAGNOSIS — Z7951 Long term (current) use of inhaled steroids: Secondary | ICD-10-CM | POA: Insufficient documentation

## 2016-07-10 DIAGNOSIS — Z800 Family history of malignant neoplasm of digestive organs: Secondary | ICD-10-CM | POA: Insufficient documentation

## 2016-07-10 DIAGNOSIS — D125 Benign neoplasm of sigmoid colon: Secondary | ICD-10-CM

## 2016-07-10 DIAGNOSIS — Z79891 Long term (current) use of opiate analgesic: Secondary | ICD-10-CM | POA: Insufficient documentation

## 2016-07-10 DIAGNOSIS — R1310 Dysphagia, unspecified: Secondary | ICD-10-CM | POA: Insufficient documentation

## 2016-07-10 DIAGNOSIS — F329 Major depressive disorder, single episode, unspecified: Secondary | ICD-10-CM | POA: Diagnosis not present

## 2016-07-10 DIAGNOSIS — K319 Disease of stomach and duodenum, unspecified: Secondary | ICD-10-CM | POA: Insufficient documentation

## 2016-07-10 DIAGNOSIS — F419 Anxiety disorder, unspecified: Secondary | ICD-10-CM | POA: Insufficient documentation

## 2016-07-10 DIAGNOSIS — K5730 Diverticulosis of large intestine without perforation or abscess without bleeding: Secondary | ICD-10-CM | POA: Diagnosis not present

## 2016-07-10 DIAGNOSIS — Z87891 Personal history of nicotine dependence: Secondary | ICD-10-CM | POA: Insufficient documentation

## 2016-07-10 DIAGNOSIS — D128 Benign neoplasm of rectum: Secondary | ICD-10-CM

## 2016-07-10 HISTORY — PX: COLONOSCOPY WITH PROPOFOL: SHX5780

## 2016-07-10 HISTORY — DX: Pain, unspecified: R52

## 2016-07-10 HISTORY — DX: Pneumonia, unspecified organism: J18.9

## 2016-07-10 HISTORY — PX: ESOPHAGOGASTRODUODENOSCOPY (EGD) WITH PROPOFOL: SHX5813

## 2016-07-10 SURGERY — ESOPHAGOGASTRODUODENOSCOPY (EGD) WITH PROPOFOL
Anesthesia: Monitor Anesthesia Care

## 2016-07-10 MED ORDER — PROPOFOL 10 MG/ML IV BOLUS
INTRAVENOUS | Status: DC | PRN
  Administered 2016-07-10 (×4): 20 mg via INTRAVENOUS
  Administered 2016-07-10: 10 mg via INTRAVENOUS
  Administered 2016-07-10 (×22): 20 mg via INTRAVENOUS
  Administered 2016-07-10: 10 mg via INTRAVENOUS
  Administered 2016-07-10 (×8): 20 mg via INTRAVENOUS

## 2016-07-10 MED ORDER — PROPOFOL 10 MG/ML IV BOLUS
INTRAVENOUS | Status: AC
  Filled 2016-07-10: qty 20

## 2016-07-10 MED ORDER — LACTATED RINGERS IV SOLN
INTRAVENOUS | Status: DC
  Administered 2016-07-10: 1000 mL via INTRAVENOUS

## 2016-07-10 MED ORDER — ALBUTEROL SULFATE HFA 108 (90 BASE) MCG/ACT IN AERS
INHALATION_SPRAY | RESPIRATORY_TRACT | Status: AC
  Filled 2016-07-10: qty 6.7

## 2016-07-10 MED ORDER — SODIUM CHLORIDE 0.9 % IV SOLN: INTRAVENOUS | Status: DC

## 2016-07-10 SURGICAL SUPPLY — 25 items

## 2016-07-10 NOTE — Interval H&P Note (Signed)
History and Physical Interval Note:  07/10/2016 9:22 AM  Maria [name]  has presented today for surgery, with the diagnosis of dysphagia/epigastric pain/screening  The various methods of treatment have been discussed with the patient and family. After consideration of risks, benefits and other options for treatment, the patient has consented to  Procedure(s): ESOPHAGOGASTRODUODENOSCOPY (EGD) WITH PROPOFOL (N/A) COLONOSCOPY WITH PROPOFOL (N/A) as a surgical intervention .  The patient's history has been reviewed, patient examined, no change in status, stable for surgery.  I have reviewed the patient's chart and labs.  Questions were answered to the patient's satisfaction.     [name]

## 2016-07-10 NOTE — Discharge Instructions (Addendum)
Gastrointestinal Endoscopy, Care After °Refer to this sheet in the next few weeks. These instructions provide you with information on caring for yourself after your procedure. Your caregiver may also give you more specific instructions. Your treatment has been planned according to current medical practices, but problems sometimes occur. Call your caregiver if you have any problems or questions after your procedure. °HOME CARE INSTRUCTIONS °· If you were given medicine to help you relax (sedative), do not drive, operate machinery, or sign important documents for 24 hours. °· Avoid alcohol and hot or warm beverages for the first 24 hours after the procedure. °· Only take over-the-counter or prescription medicines for pain, discomfort, or fever as directed by your caregiver. You may resume taking your normal medicines unless your caregiver tells you otherwise. Ask your caregiver when you may resume taking medicines that may cause bleeding, such as aspirin, clopidogrel, or warfarin. °· You may return to your normal diet and activities on the day after your procedure, or as directed by your caregiver. Walking may help to reduce any bloated feeling in your abdomen. °· Drink enough fluids to keep your urine clear or pale yellow. °· You may gargle with salt water if you have a sore throat. °SEEK IMMEDIATE MEDICAL CARE IF: °· You have severe nausea or vomiting. °· You have severe abdominal pain, abdominal cramps that last longer than 6 hours, or abdominal swelling (distention). °· You have severe shoulder or back pain. °· You have trouble swallowing. °· You have shortness of breath, your breathing is shallow, or you are breathing faster than normal. °· You have a fever or a rapid heartbeat. °· You vomit blood or material that looks like coffee grounds. °· You have bloody, black, or tarry stools. °MAKE SURE YOU: °· Understand these instructions. °· Will watch your condition. °· Will get help right away if you are not doing  well or get worse. °  °This information is not intended to replace advice given to you by your health care provider. Make sure you discuss any questions you have with your health care provider. °  °Document Released: 07/21/2004 Document Revised: 12/28/2014 Document Reviewed: 03/08/2012 °Elsevier Interactive Patient Education ©2016 Elsevier Inc. ° ° °Colonoscopy, Care After °Refer to this sheet in the next few weeks. These instructions provide you with information on caring for yourself after your procedure. Your health care provider may also give you more specific instructions. Your treatment has been planned according to current medical practices, but problems sometimes occur. Call your health care provider if you have any problems or questions after your procedure. °WHAT TO EXPECT AFTER THE PROCEDURE  °After your procedure, it is typical to have the following: °· A small amount of blood in your stool. °· Moderate amounts of gas and mild abdominal cramping or bloating. °HOME CARE INSTRUCTIONS °· Do not drive, operate machinery, or sign important documents for 24 hours. °· You may shower and resume your regular physical activities, but move at a slower pace for the first 24 hours. °· Take frequent rest periods for the first 24 hours. °· Walk around or put a warm pack on your abdomen to help reduce abdominal cramping and bloating. °· Drink enough fluids to keep your urine clear or pale yellow. °· You may resume your normal diet as instructed by your health care provider. Avoid heavy or fried foods that are hard to digest. °· Avoid drinking alcohol for 24 hours or as instructed by your health care provider. °· Only take over-the-counter   or prescription medicines as directed by your health care provider. °· If a tissue sample (biopsy) was taken during your procedure: °· Do not take aspirin or blood thinners for 7 days, or as instructed by your health care provider. °· Do not drink alcohol for 7 days, or as instructed  by your health care provider. °· Eat soft foods for the first 24 hours. °SEEK MEDICAL CARE IF: °You have persistent spotting of blood in your stool 2-3 days after the procedure. °SEEK IMMEDIATE MEDICAL CARE IF: °· You have more than a small spotting of blood in your stool. °· You pass large blood clots in your stool. °· Your abdomen is swollen (distended). °· You have nausea or vomiting. °· You have a fever. °· You have increasing abdominal pain that is not relieved with medicine. °  °This information is not intended to replace advice given to you by your health care provider. Make sure you discuss any questions you have with your health care provider. °  °Document Released: 07/21/2004 Document Revised: 09/27/2013 Document Reviewed: 08/14/2013 °Elsevier Interactive Patient Education ©2016 Elsevier Inc. ° °

## 2016-07-10 NOTE — Anesthesia Preprocedure Evaluation (Addendum)
Anesthesia Evaluation  Patient identified by MRN, date of birth, ID band Patient awake    Reviewed: Allergy & Precautions, NPO status , Patient's Chart, lab work & pertinent test results  Airway Mallampati: III  TM Distance: >3 FB Neck ROM: Full    Dental no notable dental hx. (+) Teeth Intact   Pulmonary pneumonia, resolved, former smoker,    Pulmonary exam normal breath sounds clear to auscultation       Cardiovascular hypertension, + Peripheral Vascular Disease  Normal cardiovascular exam Rhythm:Regular Rate:Normal     Neuro/Psych PSYCHIATRIC DISORDERS Anxiety Depression Restless legs syndrome  Neuromuscular disease    GI/Hepatic GERD  Medicated and Controlled,(+) Hepatitis -, [id number] Epigastric pain Diverticulosis   Endo/Other  Morbid obesity  Renal/GU negative Renal ROS  negative genitourinary   Musculoskeletal  (+) Arthritis , Osteoarthritis,  Fibromyalgia -Chronic LBP Spinal stenosis   Abdominal (+) + obese,   Peds  Hematology   Anesthesia Other Findings   Reproductive/Obstetrics                            Anesthesia Physical Anesthesia Plan  ASA: III  Anesthesia Plan: MAC   Post-op Pain Management:    Induction: Intravenous  Airway Management Planned: Natural Airway, Simple Face Mask and Nasal Cannula  Additional Equipment:   Intra-op Plan:   Post-operative Plan:   Informed Consent: I have reviewed the patients History and Physical, chart, labs and discussed the procedure including the risks, benefits and alternatives for the proposed anesthesia with the patient or authorized representative who has indicated his/her understanding and acceptance.     Plan Discussed with: Anesthesiologist, CRNA and Surgeon  Anesthesia Plan Comments:         Anesthesia Quick Evaluation

## 2016-07-10 NOTE — Procedures (Addendum)
Operative Note as below given Provation is down today. Pictures are not available for this exam.   Colonoscopy: for screening Scope in @ 946 Cecum @ 950 Scope out @ 1009  Findings: The colonoscope was placed into the anus and advanced to the cecum. 1cm cecal AVM, not treated given no anemia or GI bleeding 77mm cecal sessile polyp removed via cold snare 2 x 90mm transverse sessile polyps removed via cold snare 17mm descending sessile polyp removed via cold snare 3mm sigmoid sessile polyp removed via cold snare 6mm rectal polyp removed via cold snare Internal hemorrhoids noted on retroflexed views. Mild sigmoid diverticulosis The remainder of the examined [name] was normal.   EGD: for epigastric pain and dysphagia The upper endoscope was placed into the mouth and advanced to the second portion of the duodenum. DH, GEJ, and SCJ located @ 42cm Normal esophagus, no stenosis. Biopsies taken to rule out EoE. No dilation performed Normal stomach - biopsies taken to rule out H pylori Normal duodenum   [name], MD [facility] Pager [phone]

## 2016-07-10 NOTE — H&P (Signed)
HPI :  60 y/o female here for endoscopy today for evaluation for epigastric pain and for screening colonoscopy: History as outlined  Patient has had some epigastric pain which comes and goes for years. She states it feels like a contraction. She reports being to the ER 4 times for this type of pain historically This has been ongoing for a few years. She has had episodes once every 3-4 months. Pain usually bothers her after she eats. She has tried to avoid fatty foods at it can cause symptoms. She reports onset of symptoms roughly within a few hours of eating. Pain is in the epigastric area. She reports abdominal distension with her pain. She has vomiting with this symptom when she has pain. She reports having had elevated WBC and fevers at times when this occurs. Pain lasts for upwards of 5 hours and then resolves. Pain can radiate to the LUQ. Pain does not radiate to the shoulders / back. She usually will have vomiting with this. She has had diarrhea with this once. She denies frequent NSAID use. She takes percocet roughly 4 per day. She denies any frequent nausea or vomiting. She does endorse some ocasional dysphagia to the sternal notch. She reports this longstanding. 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 She had an US of the abdomen which showed fatty liver but no gallstones. She denies any alcohol use. No tobacco use. She thinks she may have gained some weight recently. No FH of liver disease. She takes protonix 40mg  once daily which controls heartburn.   Uncle had [place] cancer. Last colonoscopy was in 2003 which showed diverticulosis. She denies any blood in the stools. She reports some baseline constipation at times. Lately she reports her stools have been fairly regular.   She has had 4 CT scans since 2013 in the setting of these symptoms without bowel obstruction noted.   Past Medical History  Diagnosis Date  . Acute  lymphadenitis 2011  . ALLERGIC RHINITIS 08/10/2007  . ANXIETY 12/06/2007  . BACK PAIN 12/06/2007  . DEPRESSION 12/06/2007  . DIVERTICULOSIS, [place] 12/06/2007  . GERD 12/06/2007  . HYPERTENSION 12/06/2007  . LOW BACK PAIN 12/06/2007  . MRSA 2006  . Pain in joint, multiple sites 04/21/2010  . PARONYCHIA, FINGER 08/13/2009  . RASH-NONVESICULAR 05/09/2008  . SHOULDER PAIN, LEFT 05/09/2008  . SINUSITIS- ACUTE-NOS 05/09/2008  . THORACIC/LUMBOSACRAL NEURITIS/RADICULITIS UNSPEC 12/28/2008  . Lumbar disc disease 03/09/2012  . Cervical disc disease 03/09/2012  . Chronic pain 03/09/2012  . Impaired glucose tolerance 03/13/2012  . Cellulitis 2011  . Hepatitis     age 89 hepatitis A  . Atherosclerotic peripheral vascular disease ([place]) 06/13/2013    Aorta on CT June 2014  . Pain     by breast area  . Pneumonia sept 2015    walking pneumonia, sepsis      Past Surgical History  Procedure Laterality Date  . S/p ovary cyst    . S/p right knee arthroscopy      Dr. Mardelle Matte ortho  . Cervical disc surgery  may 24th 2010  . Bloo clot removed from neck   may 25th , june 2. 2010  . Abdominal hysterectomy  1999    1 ovary left   Family History  Problem Relation Age of Onset  . Heart disease Mother   . Hypertension Mother   . Diabetes Mother   . Heart failure Mother   .  Asthma Sister   . Asthma Daughter   . Cancer Maternal Uncle     [place]  . Cancer Other     ovarian  . Hypertension Father   . Liver cancer Paternal Grandmother    Social History  Substance Use Topics  . Smoking status: Former Smoker -- 30 years    Quit date: 11/08/2008  . Smokeless tobacco: Never Used     Comment: quit 10/09  . Alcohol Use: No   Current Facility-Administered Medications  Medication Dose Route Frequency Provider Last Rate Last Dose  . 0.9 %  sodium chloride infusion   Intravenous Continuous Manus Gunning, MD      . lactated ringers infusion   Intravenous Continuous Manus Gunning, MD 10 mL/hr  at 07/10/16 0918 1,000 mL at 07/10/16 0918   Allergies  Allergen Reactions  . Adhesive [Tape] Other (See Comments)    Tears skin off - use paper tape   . Amoxicillin-Pot Clavulanate Nausea And Vomiting  . Codeine Other (See Comments)    hallucinations  . Lactose Intolerance (Gi) Nausea And Vomiting  . Morphine And Related Nausea And Vomiting and Other (See Comments)    headaches  . Vicodin [Hydrocodone-Acetaminophen] Other (See Comments)    hallucinations  . Latex Rash     Review of Systems: All systems reviewed and negative except where noted in HPI.    No results found.  Physical Exam: BP 149/57 mmHg  Pulse 87  Temp(Src) 99 F (37.2 C) (Oral)  Resp 16  Ht 5\' 3"  (1.6 m)  Wt 349 lb (158.305 kg)  BMI 61.84 kg/m2  SpO2 96% Constitutional: Pleasant, obesefemale in no acute distress. HEENT: Normocephalic and atraumatic. Conjunctivae are normal. No scleral icterus. Neck supple.  Cardiovascular: Normal rate, regular rhythm.  Pulmonary/chest: Effort normal and breath sounds normal. No wheezing, rales or rhonchi. Abdominal: Soft, nondistended, nontender. Bowel sounds active throughout. There are no masses palpable. No hepatomegaly.   ASSESSMENT AND PLAN: 60 y/o female with epigastric pain longstanding without clear etiology, will proceed with upper endoscopy today. She is due for a screening colonoscopy at the same time. Case done with MAC at hospital given BMI. Discussed risks / benefits she wishes to proceed.   [place] Cellar, MD [facility] Pager [phone]   No ref. provider found

## 2016-07-10 NOTE — Transfer of Care (Signed)
Immediate Anesthesia Transfer of Care Note  Patient: Maria [name]  Procedure(s) Performed: Procedure(s): ESOPHAGOGASTRODUODENOSCOPY (EGD) WITH PROPOFOL (N/A) COLONOSCOPY WITH PROPOFOL (N/A)  Patient Location: PACU  Anesthesia Type:MAC  Level of Consciousness: sedated  Airway & Oxygen Therapy: Patient Spontanous Breathing and Patient connected to nasal cannula oxygen  Post-op Assessment: Report given to RN and Post -op Vital signs reviewed and stable  Post vital signs: Reviewed and stable  Last Vitals:  Filed Vitals:   07/10/16 0911 07/10/16 0917  BP:  149/57  Pulse: 87   Temp: 37.2 C   Resp: 16     Last Pain: There were no vitals filed for this visit.       Complications: No apparent anesthesia complications

## 2016-07-10 NOTE — Anesthesia Postprocedure Evaluation (Addendum)
Anesthesia Post Note  Patient: Maria [name]  Procedure(s) Performed: Procedure(s) (LRB): ESOPHAGOGASTRODUODENOSCOPY (EGD) WITH PROPOFOL (N/A) COLONOSCOPY WITH PROPOFOL (N/A)  Patient location during evaluation: PACU Anesthesia Type: MAC Level of consciousness: awake and alert and oriented Pain management: pain level controlled Vital Signs Assessment: post-procedure vital signs reviewed and stable Respiratory status: spontaneous breathing, nonlabored ventilation and respiratory function stable Cardiovascular status: blood pressure returned to baseline and stable Postop Assessment: no signs of nausea or vomiting Anesthetic complications: no    Last Vitals:  Filed Vitals:   07/10/16 0917 07/10/16 1016  BP: 149/57 169/61  Pulse:  93  Temp:  36.7 C  Resp:  20    Last Pain: There were no vitals filed for this visit.               Delia Slatten A.

## 2016-07-12 ENCOUNTER — Encounter (HOSPITAL_COMMUNITY): Payer: Self-pay | Admitting: Gastroenterology

## 2016-07-16 ENCOUNTER — Telehealth: Payer: Self-pay | Admitting: Gastroenterology

## 2016-07-16 NOTE — Telephone Encounter (Signed)
Copy of report up front for pick up. Patient aware.

## 2016-07-17 ENCOUNTER — Other Ambulatory Visit: Payer: Self-pay | Admitting: *Deleted

## 2016-07-17 ENCOUNTER — Ambulatory Visit (INDEPENDENT_AMBULATORY_CARE_PROVIDER_SITE_OTHER): Payer: Medicare Other | Admitting: Gastroenterology

## 2016-07-17 DIAGNOSIS — Z23 Encounter for immunization: Secondary | ICD-10-CM | POA: Diagnosis not present

## 2016-07-17 DIAGNOSIS — K760 Fatty (change of) liver, not elsewhere classified: Secondary | ICD-10-CM

## 2016-08-11 DIAGNOSIS — K635 Polyp of colon: Secondary | ICD-10-CM | POA: Insufficient documentation

## 2016-08-26 ENCOUNTER — Encounter (HOSPITAL_COMMUNITY): Payer: Self-pay | Admitting: Emergency Medicine

## 2016-08-26 ENCOUNTER — Emergency Department (HOSPITAL_COMMUNITY): Payer: Medicare Other

## 2016-08-26 ENCOUNTER — Emergency Department (HOSPITAL_COMMUNITY)
Admission: EM | Admit: 2016-08-26 | Discharge: 2016-08-26 | Disposition: A | Discharge status: Home / Self Care | Payer: Medicare Other | Attending: Emergency Medicine | Admitting: Emergency Medicine

## 2016-08-26 DIAGNOSIS — Z87891 Personal history of nicotine dependence: Secondary | ICD-10-CM | POA: Diagnosis not present

## 2016-08-26 DIAGNOSIS — T148XXA Other injury of unspecified body region, initial encounter: Secondary | ICD-10-CM

## 2016-08-26 DIAGNOSIS — S161XXA Strain of muscle, fascia and tendon at neck level, initial encounter: Secondary | ICD-10-CM | POA: Insufficient documentation

## 2016-08-26 DIAGNOSIS — S169XXA Unspecified injury of muscle, fascia and tendon at neck level, initial encounter: Secondary | ICD-10-CM | POA: Diagnosis present

## 2016-08-26 DIAGNOSIS — Y999 Unspecified external cause status: Secondary | ICD-10-CM | POA: Insufficient documentation

## 2016-08-26 DIAGNOSIS — Z79899 Other long term (current) drug therapy: Secondary | ICD-10-CM | POA: Insufficient documentation

## 2016-08-26 DIAGNOSIS — I10 Essential (primary) hypertension: Secondary | ICD-10-CM | POA: Diagnosis not present

## 2016-08-26 DIAGNOSIS — Y92410 Unspecified street and highway as the place of occurrence of the external cause: Secondary | ICD-10-CM | POA: Insufficient documentation

## 2016-08-26 DIAGNOSIS — M542 Cervicalgia: Secondary | ICD-10-CM

## 2016-08-26 DIAGNOSIS — M545 Low back pain: Secondary | ICD-10-CM | POA: Insufficient documentation

## 2016-08-26 DIAGNOSIS — Y9389 Activity, other specified: Secondary | ICD-10-CM | POA: Insufficient documentation

## 2016-08-26 DIAGNOSIS — Z91040 Latex allergy status: Secondary | ICD-10-CM | POA: Diagnosis not present

## 2016-08-26 MED ORDER — OXYCODONE-ACETAMINOPHEN 5-325 MG PO TABS
2.0000 | ORAL_TABLET | Freq: Once | ORAL | Status: AC
  Administered 2016-08-26: 2 via ORAL
  Filled 2016-08-26: qty 2

## 2016-08-26 MED ORDER — PANTOPRAZOLE SODIUM 40 MG PO TBEC: 40.0000 mg | DELAYED_RELEASE_TABLET | Freq: Two times a day (BID) | ORAL | Status: DC

## 2016-08-26 MED ORDER — OXYCODONE HCL 10 MG PO TABS: 10.0000 mg | ORAL_TABLET | Freq: Once | ORAL | Status: DC

## 2016-08-26 MED ORDER — LORAZEPAM 1 MG PO TABS
1.0000 mg | ORAL_TABLET | Freq: Once | ORAL | Status: AC
  Administered 2016-08-26: 1 mg via ORAL
  Filled 2016-08-26: qty 1

## 2016-08-26 MED ORDER — NAPROXEN 500 MG PO TABS: 500.0000 mg | ORAL_TABLET | Freq: Two times a day (BID) | ORAL | 0 refills | Status: DC

## 2016-08-26 MED ORDER — ALPRAZOLAM 0.5 MG PO TABS: 0.5000 mg | ORAL_TABLET | Freq: Two times a day (BID) | ORAL | Status: DC | PRN
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
## 2016-08-26 NOTE — Discharge Instructions (Signed)
Return to the emergency department immediately if you develop any of the following symptoms: °You have numbness, tingling, or weakness in the arms or legs. °You develop severe headaches not relieved with medicine. °You have severe neck pain, especially tenderness in the middle of the back of your neck. °You have changes in bowel or bladder control. °There is increasing pain in any area of the body. °You have shortness of breath, light-headedness, dizziness, or fainting. °You have chest pain. °You feel sick to your stomach (nauseous), throw up (vomit), or sweat. °You have increasing abdominal discomfort. °There is blood in your urine, stool, or vomit. °You have pain in your shoulder (shoulder strap areas). °You feel your symptoms are getting worse. ° °

## 2016-08-26 NOTE — ED Notes (Signed)
Patient transported to X-ray 
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
## 2016-08-26 NOTE — ED Notes (Signed)
PT DISCHARGED. INSTRUCTIONS AND PRESCRIPTION GIVEN. AAOX4. PT IN NO APPARENT DISTRESS. THE OPPORTUNITY TO ASK QUESTIONS WAS PROVIDED. 

## 2016-09-07 ENCOUNTER — Encounter (HOSPITAL_COMMUNITY): Payer: Self-pay | Admitting: *Deleted

## 2016-09-07 ENCOUNTER — Emergency Department (HOSPITAL_COMMUNITY)
Admission: EM | Admit: 2016-09-07 | Discharge: 2016-09-07 | Disposition: A | Discharge status: Home / Self Care | Payer: Medicare Other | Attending: Emergency Medicine | Admitting: Emergency Medicine

## 2016-09-07 ENCOUNTER — Emergency Department (HOSPITAL_COMMUNITY): Payer: Medicare Other

## 2016-09-07 DIAGNOSIS — Z791 Long term (current) use of non-steroidal anti-inflammatories (NSAID): Secondary | ICD-10-CM | POA: Insufficient documentation

## 2016-09-07 DIAGNOSIS — R509 Fever, unspecified: Secondary | ICD-10-CM | POA: Diagnosis present

## 2016-09-07 DIAGNOSIS — I10 Essential (primary) hypertension: Secondary | ICD-10-CM | POA: Insufficient documentation

## 2016-09-07 DIAGNOSIS — Z79899 Other long term (current) drug therapy: Secondary | ICD-10-CM | POA: Diagnosis not present

## 2016-09-07 DIAGNOSIS — J40 Bronchitis, not specified as acute or chronic: Secondary | ICD-10-CM | POA: Diagnosis not present

## 2016-09-07 DIAGNOSIS — I2510 Atherosclerotic heart disease of native coronary artery without angina pectoris: Secondary | ICD-10-CM | POA: Insufficient documentation

## 2016-09-07 DIAGNOSIS — Z87891 Personal history of nicotine dependence: Secondary | ICD-10-CM | POA: Diagnosis not present

## 2016-09-07 LAB — URINALYSIS, ROUTINE W REFLEX MICROSCOPIC
Bilirubin Urine: NEGATIVE
GLUCOSE, UA: NEGATIVE mg/dL
HGB URINE DIPSTICK: NEGATIVE
Ketones, ur: NEGATIVE mg/dL
NITRITE: NEGATIVE
PH: 8 (ref 5.0–8.0)
PROTEIN: NEGATIVE mg/dL
Specific Gravity, Urine: 1.011 (ref 1.005–1.030)

## 2016-09-07 LAB — CBC WITH DIFFERENTIAL/PLATELET
Basophils Absolute: 0 K/uL (ref 0.0–0.1)
Basophils Relative: 0 %
Eosinophils Absolute: 0 K/uL (ref 0.0–0.7)
Eosinophils Relative: 0 %
HCT: 42.3 % (ref 36.0–46.0)
Hemoglobin: 13.8 g/dL (ref 12.0–15.0)
Lymphocytes Relative: 8 %
Lymphs Abs: 1.1 K/uL (ref 0.7–4.0)
MCH: 29.8 pg (ref 26.0–34.0)
MCHC: 32.6 g/dL (ref 30.0–36.0)
MCV: 91.4 fL (ref 78.0–100.0)
Monocytes Absolute: 0.7 K/uL (ref 0.1–1.0)
Monocytes Relative: 5 %
Neutro Abs: 12.4 K/uL — ABNORMAL HIGH (ref 1.7–7.7)
Neutrophils Relative %: 87 %
Platelets: 255 K/uL (ref 150–400)
RBC: 4.63 MIL/uL (ref 3.87–5.11)
RDW: 13.2 % (ref 11.5–15.5)
WBC: 14.3 K/uL — ABNORMAL HIGH (ref 4.0–10.5)

## 2016-09-07 LAB — COMPREHENSIVE METABOLIC PANEL
ALBUMIN: 4.3 g/dL (ref 3.5–5.0)
ALT: 21 U/L (ref 14–54)
ANION GAP: 7 (ref 5–15)
AST: 24 U/L (ref 15–41)
Alkaline Phosphatase: 64 U/L (ref 38–126)
BUN: 10 mg/dL (ref 6–20)
CALCIUM: 9.1 mg/dL (ref 8.9–10.3)
CO2: 29 mmol/L (ref 22–32)
Chloride: 102 mmol/L (ref 101–111)
Creatinine, Ser: 1.09 mg/dL — ABNORMAL HIGH (ref 0.44–1.00)
GFR calc non Af Amer: 54 mL/min — ABNORMAL LOW (ref >60)
GLUCOSE: 124 mg/dL — AB (ref 65–99)
POTASSIUM: 4 mmol/L (ref 3.5–5.1)
SODIUM: 138 mmol/L (ref 135–145)
Total Bilirubin: 1.5 mg/dL — ABNORMAL HIGH (ref 0.3–1.2)
Total Protein: 8 g/dL (ref 6.5–8.1)

## 2016-09-07 LAB — URINE MICROSCOPIC-ADD ON

## 2016-09-07 LAB — I-STAT CG4 LACTIC ACID, ED: Lactic Acid, Venous: 2.05 mmol/L (ref 0.5–1.9)

## 2016-09-07 MED ORDER — LEVOFLOXACIN 750 MG PO TABS: 750.0000 mg | ORAL_TABLET | Freq: Every day | ORAL | 0 refills | Status: DC

## 2016-09-07 MED ORDER — NYSTATIN 100000 UNIT/GM EX POWD: Freq: Four times a day (QID) | CUTANEOUS | 0 refills | Status: DC

## 2016-09-07 MED ORDER — IBUPROFEN 800 MG PO TABS
800.0000 mg | ORAL_TABLET | Freq: Once | ORAL | Status: AC
  Administered 2016-09-07: 800 mg via ORAL
  Filled 2016-09-07: qty 1

## 2016-09-07 MED ORDER — LEVOFLOXACIN 750 MG PO TABS
750.0000 mg | ORAL_TABLET | Freq: Once | ORAL | Status: AC
  Administered 2016-09-07: 750 mg via ORAL
  Filled 2016-09-07: qty 1

## 2016-09-07 MED ORDER — ACETAMINOPHEN 325 MG PO TABS
650.0000 mg | ORAL_TABLET | Freq: Once | ORAL | Status: AC
  Administered 2016-09-07: 650 mg via ORAL
  Filled 2016-09-07: qty 2

## 2016-09-07 MED ORDER — SODIUM CHLORIDE 0.9 % IV BOLUS (SEPSIS)
1000.0000 mL | Freq: Once | INTRAVENOUS | Status: AC
  Administered 2016-09-07: 1000 mL via INTRAVENOUS

## 2016-09-07 NOTE — ED Notes (Signed)
EDP  Campos given critical I stat result

## 2016-09-07 NOTE — ED Notes (Signed)
Attempt IV to left hand and left AC unsuccessful by this nurse. [facility] primary nurse notified.

## 2016-09-07 NOTE — Discharge Instructions (Signed)
Take antibiotics as prescribed. Drink plenty of fluids. Take ibuprofen/tylenol as needed for fever. Follow up with your PCP in 2-3 days for re-evaluation. Return to the ED if you experience increased fevers, difficulty breathing, vomiting, diarrhea, difficulty swallowing, severe neck pain.

## 2016-09-07 NOTE — ED Triage Notes (Signed)
Patient comes from home with c/o fever, 102.5 since last night.  Patient also c/o generalized body aches and pains and cough.  Patient has not treated fever at home.

## 2016-09-07 NOTE — ED Notes (Signed)
EDP to put in ultrasound iv and collect 2nd cultures, per RN

## 2016-09-07 NOTE — ED Provider Notes (Signed)
[facility] DEPT Provider Note   CSN: [id number] Arrival date & time: 09/07/16  1140     History   Chief Complaint Chief Complaint  Patient presents with  . Fever    HPI Maria [name] is a 60 y.o. female with a past medical history of fibromyalgia, hypertension who presents to the emergency department today complaining of fever and generalized body aches. Patient states that yesterday she was in her otherwise normal state of health. This morning when she woke up she had significant chills and checked her temperature and her fever was 102. Patient also reports diffuse myalgias and a nonproductive cough that started a couple of days ago. Patient states this morning she also noticed a scratchy throat and at her ears for itching. She denies any sick contacts.   HPI  Past Medical History:  Diagnosis Date  . Acute lymphadenitis 2011  . ALLERGIC RHINITIS 08/10/2007  . ANXIETY 12/06/2007  . Atherosclerotic peripheral vascular disease ([place]) 06/13/2013   Aorta on CT June 2014  . BACK PAIN 12/06/2007  . Cellulitis 2011  . Cervical disc disease 03/09/2012  . Chronic pain 03/09/2012  . DEPRESSION 12/06/2007  . DIVERTICULOSIS, [place] 12/06/2007  . GERD 12/06/2007  . Hepatitis    age 63 hepatitis A  . HYPERTENSION 12/06/2007  . Impaired glucose tolerance 03/13/2012  . LOW BACK PAIN 12/06/2007  . Lumbar disc disease 03/09/2012  . MRSA 2006  . Pain    by breast area  . Pain in joint, multiple sites 04/21/2010  . PARONYCHIA, FINGER 08/13/2009  . Pneumonia sept 2015   walking pneumonia, sepsis   . RASH-NONVESICULAR 05/09/2008  . SHOULDER PAIN, LEFT 05/09/2008  . SINUSITIS- ACUTE-NOS 05/09/2008  . THORACIC/LUMBOSACRAL NEURITIS/RADICULITIS UNSPEC 12/28/2008    Patient Active Problem List   Diagnosis Date Noted  . Abdominal pain, epigastric   . Dysphagia   . [place] cancer screening   . Adverse effect of angiotensin-converting enzyme inhibitor 12/05/2015  . Restless leg 10/08/2015  .  Dermatitis, stasis 04/26/2015  . Fibromyalgia 01/31/2015  . Acid reflux 10/17/2014  . 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 Post-traumatic osteoarthritis of both knees 09/19/2014  . Spinal stenosis 09/19/2014  . Acute respiratory failure with hypoxia ([place]) 08/20/2014  . Sepsis ([place]) 08/19/2014  . CAP (community acquired pneumonia) 08/19/2014  . Cervical disc disease 03/09/2012  . Lumbar disc disease 03/09/2012  . Chronic pain 03/09/2012  . Obesity 06/17/2011  . THORACIC/LUMBOSACRAL NEURITIS/RADICULITIS UNSPEC 12/28/2008  . ANXIETY 12/06/2007  . DEPRESSION 12/06/2007  . HYPERTENSION 12/06/2007  . LOW BACK PAIN 12/06/2007    Past Surgical History:  Procedure Laterality Date  . ABDOMINAL HYSTERECTOMY  1999   1 ovary left  . bloo clot removed from neck   may 25th , june 2. 2010  . [place] SURGERY  may 24th 2010  . COLONOSCOPY WITH PROPOFOL N/A 07/10/2016   Procedure: COLONOSCOPY WITH PROPOFOL;  Surgeon: Manus Gunning, MD;  Location: [facility];  Service: Gastroenterology;  Laterality: N/A;  . ESOPHAGOGASTRODUODENOSCOPY (EGD) WITH PROPOFOL N/A 07/10/2016   Procedure: ESOPHAGOGASTRODUODENOSCOPY (EGD) WITH PROPOFOL;  Surgeon: Manus Gunning, MD;  Location: [facility];  Service: Gastroenterology;  Laterality: N/A;  . s/p ovary cyst    . s/p right knee arthroscopy     Dr. Mardelle Matte ortho    OB History    No data available       Home Medications    Prior to Admission medications   Medication Sig Start Date End Date  Taking? Authorizing Provider  ALPRAZolam Duanne Moron) 0.5 MG tablet Take 0.5 mg by mouth 2 (two) times daily as needed for anxiety or sleep.  05/28/16  Yes Historical Provider, MD  cyclobenzaprine (FLEXERIL) 10 MG tablet Take 10 mg by mouth daily as needed for muscle spasms.  02/07/15  Yes Historical Provider, MD  fluticasone (FLONASE) 50 MCG/ACT nasal spray Place 1 spray into the nose at bedtime.  03/07/15  Yes Historical Provider, MD    furosemide (LASIX) 20 MG tablet Take 20 mg by mouth daily as needed for fluid.  06/03/15  Yes Historical Provider, MD  hydroxypropyl methylcellulose / hypromellose (ISOPTO TEARS / GONIOVISC) 2.5 % ophthalmic solution Place 1 drop into both eyes 3 (three) times daily as needed for dry eyes.   Yes Historical Provider, MD  losartan (COZAAR) 50 MG tablet Take 50 mg by mouth daily. Reported on 06/26/2016 12/05/15  Yes Historical Provider, MD  ondansetron (ZOFRAN ODT) 4 MG disintegrating tablet Take 1 tablet (4 mg total) by mouth every 8 (eight) hours as needed for nausea. 06/30/15  Yes Noemi Chapel, MD  Oxycodone HCl 10 MG TABS Take 10 mg by mouth 4 (four) times daily. Pain. 12/11/15  Yes Historical Provider, MD  pantoprazole (PROTONIX) 40 MG tablet Take 1 tablet (40 mg total) by mouth 2 (two) times daily. 05/26/16  Yes Manus Gunning, MD  rOPINIRole (REQUIP) 1 MG tablet Take 2 mg by mouth at bedtime.  04/01/16  Yes Historical Provider, MD  sertraline (ZOLOFT) 50 MG tablet Take 50 mg by mouth every morning.    Yes Historical Provider, MD  triamcinolone cream (KENALOG) 0.1 % Apply 1 application topically 2 (two) times daily as needed (rash).  06/03/15  Yes Historical Provider, MD  famotidine (PEPCID) 20 MG tablet Take 1 tablet (20 mg total) by mouth 2 (two) times daily. Patient not taking: Reported on 05/28/2016 04/28/16   Recardo Evangelist, PA-C  Na Sulfate-K Sulfate-Mg Sulf 17.5-3.13-1.6 GM/180ML SOLN Take 1 kit by mouth once. Patient not taking: Reported on 05/28/2016 05/26/16   Manus Gunning, MD  naproxen (NAPROSYN) 500 MG tablet Take 1 tablet (500 mg total) by mouth 2 (two) times daily with a meal. 08/26/16   Margarita Mail, PA-C    Family History Family History  Problem Relation Age of Onset  . Heart disease Mother   . Hypertension Mother   . Diabetes Mother   . Heart failure Mother   . Asthma Sister   . Hypertension Father   . Asthma Daughter   . Cancer Maternal Uncle     [place]  .  Cancer Other     ovarian  . Liver cancer Paternal Grandmother     Social History Social History  Substance Use Topics  . Smoking status: Former Smoker    Years: 30.00    Quit date: 11/08/2008  . Smokeless tobacco: Never Used     Comment: quit 10/09  . Alcohol use No     Allergies   Adhesive [tape]; Amoxicillin-pot clavulanate; Codeine; Lactose intolerance (gi); Morphine and related; Vicodin [hydrocodone-acetaminophen]; and Latex   Review of Systems Review of Systems  All other systems reviewed and are negative.    Physical Exam Updated Vital Signs BP 178/94   Pulse 97   Temp 102 F (38.9 C) (Oral)   Resp 19   Ht '5\' 4"'  (1.626 m)   Wt (!) 149.7 kg   SpO2 95%   BMI 56.64 kg/m   Physical Exam  Constitutional: She is  oriented to person, place, and time. No distress.  Obese  HENT:  Head: Normocephalic and atraumatic.  Nose: Nose normal.  Mouth/Throat: No oropharyngeal exudate.  TMs clear bilaterally. Oropharynx is clear and moist.  Eyes: Conjunctivae and EOM are normal. Pupils are equal, round, and reactive to light. Right eye exhibits no discharge. Left eye exhibits no discharge. No scleral icterus.  Neck: Normal range of motion. Neck supple.  No meningismus.  Cardiovascular: Normal rate, regular rhythm, normal heart sounds and intact distal pulses.  Exam reveals no gallop and no friction rub.   No murmur heard. Pulmonary/Chest: Effort normal and breath sounds normal. No respiratory distress. She has no wheezes. She has no rales. She exhibits no tenderness.  Abdominal: Soft. She exhibits no distension. There is no tenderness. There is no guarding.  Musculoskeletal: Normal range of motion. She exhibits no edema.  Lymphadenopathy:    She has no cervical adenopathy.  Neurological: She is alert and oriented to person, place, and time.  Skin: Skin is warm and dry. No rash noted. She is not diaphoretic. No erythema. No pallor.  Candidal infection under bilateral  breasts and in left groin without overt cellulitis  Psychiatric: She has a normal mood and affect. Her behavior is normal.  Nursing note and vitals reviewed.    ED Treatments / Results  Labs (all labs ordered are listed, but only abnormal results are displayed) Labs Reviewed  COMPREHENSIVE METABOLIC PANEL - Abnormal; Notable for the following:       Result Value   Glucose, Bld 124 (*)    Creatinine, Ser 1.09 (*)    Total Bilirubin 1.5 (*)    GFR calc non Af Amer 54 (*)    All other components within normal limits  URINALYSIS, ROUTINE W REFLEX MICROSCOPIC (NOT AT [facility]) - Abnormal; Notable for the following:    APPearance CLOUDY (*)    Leukocytes, UA MODERATE (*)    All other components within normal limits  CBC WITH DIFFERENTIAL/PLATELET - Abnormal; Notable for the following:    WBC 14.3 (*)    Neutro Abs 12.4 (*)    All other components within normal limits  URINE MICROSCOPIC-ADD ON - Abnormal; Notable for the following:    Squamous Epithelial / LPF 6-30 (*)    Bacteria, UA RARE (*)    All other components within normal limits  I-STAT CG4 LACTIC ACID, ED - Abnormal; Notable for the following:    Lactic Acid, Venous 2.05 (*)    All other components within normal limits  CULTURE, BLOOD (ROUTINE X 2)  CULTURE, BLOOD (ROUTINE X 2)  URINE CULTURE    EKG  EKG Interpretation None       Radiology Dg Chest 2 View  Result Date: 09/07/2016 CLINICAL DATA:  Pain all over. Posterior left side chest pain. Cough. EXAM: CHEST  2 VIEW COMPARISON:  05/29/2016 FINDINGS: Mild peribronchial thickening. Heart and mediastinal contours are within normal limits. No focal opacities or effusions. No acute bony abnormality. IMPRESSION: Mild bronchitic changes. Electronically Signed   By: Rolm Baptise M.D.   On: 09/07/2016 12:20    Procedures Procedures (including critical care time)  Medications Ordered in ED Medications  sodium chloride 0.9 % bolus 1,000 mL (1,000 mLs Intravenous New  Bag/Given 09/07/16 1504)  acetaminophen (TYLENOL) tablet 650 mg (650 mg Oral Given 09/07/16 1503)     Initial Impression / Assessment and Plan / ED Course  I have reviewed the triage vital signs and the nursing notes.  Pertinent  labs & imaging results that were available during my care of the patient were reviewed by me and considered in my medical decision making (see chart for details).  Clinical Course    60 year old female presents to the ED with fever of 102 onset this morning. Patient has associated nonproductive cough and myalgias. On presentation to ED, patient overall appears well. Nontoxic and nonseptic appearing. Difficult to obtain IV access, ultrasound guided IV performed by Dr. Venora Maples. Patient was able to receive approximately 1/4-half a liter fluids through the IV but unfortunately the IV infiltrated. Patient now drinking water to rehydrate. Lactic acid mildly elevated at 2. Do not think this is due to sepsis.  UA does not show sign of severe infection. 6-30 WBC, moderate leukocytes. Wil lsend for culture. TMs clear bilaterally. Oropharynx is clear and moist. No meningismus. NO evidence of cellulitis. Pt does have candidal infection under b/l breasts and groin however, no overt cellulitis. Doubt source of fever. Chest x-ray does reveal a bronchitic changes. It is possible that patient's fever is due to bronchitis pneumonia. We will treat with Levaquin. This would also cover for UTI if this is source of infection. Patient given dose of Tylenol as well as ibuprofen in the ED with significant symptomatic improvement. Fever improving. No hypoxia. VSS. Feel that pt is safe for dishcarge with close PCP follow up. Pt will contact PCP tomorrow for appointment. Return precautions outlined in patient discharge instructions.   Patient was discussed with and seen by Dr. Venora Maples who agrees with the treatment plan.    Final Clinical Impressions(s) / ED Diagnoses   Final diagnoses:  Fever,  unspecified fever cause  Bronchitis    New Prescriptions New Prescriptions   LEVOFLOXACIN (LEVAQUIN) 750 MG TABLET    Take 1 tablet (750 mg total) by mouth daily.   NYSTATIN (MYCOSTATIN/NYSTOP) POWDER    Apply topically 4 (four) times daily.     Dondra Spry Stanton, PA-C 09/07/16 [place], MD 09/08/16 [phone]

## 2016-09-07 NOTE — ED Triage Notes (Signed)
Attempted IV x 2 without success, Samantha PA will speak with Dr Venora Maples regarding possible ultrasound IV.

## 2016-09-08 LAB — URINE CULTURE

## 2016-09-12 LAB — CULTURE, BLOOD (ROUTINE X 2)
CULTURE: NO GROWTH
Culture: NO GROWTH

## 2016-10-27 ENCOUNTER — Ambulatory Visit: Payer: Medicare Other | Admitting: Internal Medicine

## 2016-11-29 ENCOUNTER — Ambulatory Visit (HOSPITAL_COMMUNITY)
Admission: EM | Admit: 2016-11-29 | Discharge: 2016-11-29 | Disposition: A | Discharge status: Home / Self Care | Payer: Medicare Other | Attending: Family Medicine | Admitting: Family Medicine

## 2016-11-29 ENCOUNTER — Encounter (HOSPITAL_COMMUNITY): Payer: Self-pay | Admitting: *Deleted

## 2016-11-29 DIAGNOSIS — S83001A Unspecified subluxation of right patella, initial encounter: Secondary | ICD-10-CM | POA: Diagnosis not present

## 2016-11-29 NOTE — ED Provider Notes (Signed)
[facility]    CSN: [id number] Arrival date & time: 11/29/16  1547     History   Chief Complaint Chief Complaint  Patient presents with  . Knee Injury    HPI Maria [name] is a 60 y.o. female.   The history is provided by the patient.  Knee Pain  Location:  Knee Injury: yes   Mechanism of injury comment:  Turned while in chair and felt sudden pain ant aspect of right knee. continues with sx. Knee location:  R knee Pain details:    Quality:  Sharp   Radiates to:  Does not radiate   Severity:  Moderate   Onset quality:  Sudden Chronicity:  New Dislocation: yes   Prior injury to area:  Yes Relieved by:  Rest Worsened by:  Bearing weight and flexion Associated symptoms: decreased ROM   Associated symptoms: no numbness, no swelling and no tingling   Risk factors: obesity     Past Medical History:  Diagnosis Date  . Acute lymphadenitis 2011  . ALLERGIC RHINITIS 08/10/2007  . ANXIETY 12/06/2007  . Atherosclerotic peripheral vascular disease ([place]) 06/13/2013   Aorta on CT June 2014  . BACK PAIN 12/06/2007  . Cellulitis 2011  . Cervical disc disease 03/09/2012  . Chronic pain 03/09/2012  . DEPRESSION 12/06/2007  . DIVERTICULOSIS, [place] 12/06/2007  . GERD 12/06/2007  . Hepatitis    age 9 hepatitis A  . HYPERTENSION 12/06/2007  . Impaired glucose tolerance 03/13/2012  . LOW BACK PAIN 12/06/2007  . Lumbar disc disease 03/09/2012  . MRSA 2006  . Pain    by breast area  . Pain in joint, multiple sites 04/21/2010  . PARONYCHIA, FINGER 08/13/2009  . Pneumonia sept 2015   walking pneumonia, sepsis   . RASH-NONVESICULAR 05/09/2008  . SHOULDER PAIN, LEFT 05/09/2008  . SINUSITIS- ACUTE-NOS 05/09/2008  . THORACIC/LUMBOSACRAL NEURITIS/RADICULITIS UNSPEC 12/28/2008    Patient Active Problem List   Diagnosis Date Noted  . Abdominal pain, epigastric   . Dysphagia   . [place] cancer screening   . Adverse effect of angiotensin-converting enzyme inhibitor 12/05/2015    . Restless leg 10/08/2015  . Dermatitis, stasis 04/26/2015  . Fibromyalgia 01/31/2015  . Acid reflux 10/17/2014  . 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 Post-traumatic osteoarthritis of both knees 09/19/2014  . Spinal stenosis 09/19/2014  . Acute respiratory failure with hypoxia ([place]) 08/20/2014  . Sepsis ([place]) 08/19/2014  . CAP (community acquired pneumonia) 08/19/2014  . Cervical disc disease 03/09/2012  . Lumbar disc disease 03/09/2012  . Chronic pain 03/09/2012  . Obesity 06/17/2011  . THORACIC/LUMBOSACRAL NEURITIS/RADICULITIS UNSPEC 12/28/2008  . ANXIETY 12/06/2007  . DEPRESSION 12/06/2007  . HYPERTENSION 12/06/2007  . LOW BACK PAIN 12/06/2007    Past Surgical History:  Procedure Laterality Date  . ABDOMINAL HYSTERECTOMY  1999   1 ovary left  . bloo clot removed from neck   may 25th , june 2. 2010  . [place] SURGERY  may 24th 2010  . COLONOSCOPY WITH PROPOFOL N/A 07/10/2016   Procedure: COLONOSCOPY WITH PROPOFOL;  Surgeon: Manus Gunning, MD;  Location: [facility];  Service: Gastroenterology;  Laterality: N/A;  . ESOPHAGOGASTRODUODENOSCOPY (EGD) WITH PROPOFOL N/A 07/10/2016   Procedure: ESOPHAGOGASTRODUODENOSCOPY (EGD) WITH PROPOFOL;  Surgeon: Manus Gunning, MD;  Location[facility];  Service: Gastroenterology;  Laterality: N/A;  . s/p ovary cyst    . s/p right knee arthroscopy     Dr. Mardelle Matte ortho    OB History  No data available       Home Medications    Prior to Admission medications   Medication Sig Start Date End Date Taking? Authorizing Provider  ALPRAZolam Duanne Moron) 0.5 MG tablet Take 0.5 mg by mouth 2 (two) times daily as needed for anxiety or sleep.  05/28/16   Historical Provider, MD  cyclobenzaprine (FLEXERIL) 10 MG tablet Take 10 mg by mouth daily as needed for muscle spasms.  02/07/15   Historical Provider, MD  famotidine (PEPCID) 20 MG tablet Take 1 tablet (20 mg total) by mouth 2 (two) times daily. Patient not  taking: Reported on 05/28/2016 04/28/16   Recardo Evangelist, PA-C  fluticasone [facility]) 50 MCG/ACT nasal spray Place 1 spray into the nose at bedtime.  03/07/15   Historical Provider, MD  furosemide (LASIX) 20 MG tablet Take 20 mg by mouth daily as needed for fluid.  06/03/15   Historical Provider, MD  hydroxypropyl methylcellulose / hypromellose (ISOPTO TEARS / GONIOVISC) 2.5 % ophthalmic solution Place 1 drop into both eyes 3 (three) times daily as needed for dry eyes.    Historical Provider, MD  levofloxacin (LEVAQUIN) 750 MG tablet Take 1 tablet (750 mg total) by mouth daily. 09/07/16   Samantha Tripp Dowless, PA-C  losartan (COZAAR) 50 MG tablet Take 50 mg by mouth daily. Reported on 06/26/2016 12/05/15   Historical Provider, MD  Na Sulfate-K Sulfate-Mg Sulf 17.5-3.13-1.6 GM/180ML SOLN Take 1 kit by mouth once. Patient not taking: Reported on 05/28/2016 05/26/16   Manus Gunning, MD  naproxen (NAPROSYN) 500 MG tablet Take 1 tablet (500 mg total) by mouth 2 (two) times daily with a meal. 08/26/16   Margarita Mail, PA-C  nystatin (MYCOSTATIN/NYSTOP) powder Apply topically 4 (four) times daily. 09/07/16   Samantha Tripp Dowless, PA-C  ondansetron (ZOFRAN ODT) 4 MG disintegrating tablet Take 1 tablet (4 mg total) by mouth every 8 (eight) hours as needed for nausea. 06/30/15   Noemi Chapel, MD  Oxycodone HCl 10 MG TABS Take 10 mg by mouth 4 (four) times daily. Pain. 12/11/15   Historical Provider, MD  pantoprazole (PROTONIX) 40 MG tablet Take 1 tablet (40 mg total) by mouth 2 (two) times daily. 05/26/16   Manus Gunning, MD  rOPINIRole (REQUIP) 1 MG tablet Take 2 mg by mouth at bedtime.  04/01/16   Historical Provider, MD  sertraline (ZOLOFT) 50 MG tablet Take 50 mg by mouth every morning.     Historical Provider, MD  triamcinolone cream (KENALOG) 0.1 % Apply 1 application topically 2 (two) times daily as needed (rash).  06/03/15   Historical Provider, MD    Family History Family History  Problem  Relation Age of Onset  . Heart disease Mother   . Hypertension Mother   . Diabetes Mother   . Heart failure Mother   . Asthma Sister   . Hypertension Father   . Asthma Daughter   . Cancer Maternal Uncle     [place]  . Cancer Other     ovarian  . Liver cancer Paternal Grandmother     Social History Social History  Substance Use Topics  . Smoking status: Former Smoker    Years: 30.00    Quit date: 11/08/2008  . Smokeless tobacco: Never Used     Comment: quit 10/09  . Alcohol use No     Allergies   Adhesive [tape]; Amoxicillin-pot clavulanate; Codeine; Lactose intolerance (gi); Morphine and related; Vicodin [hydrocodone-acetaminophen]; and Latex   Review of Systems Review of Systems  Musculoskeletal: Positive for gait problem. Negative for joint swelling.  Skin: Negative.   All other systems reviewed and are negative.    Physical Exam Triage Vital Signs ED Triage Vitals  Enc Vitals Group     BP 11/29/16 1623 117/68     Pulse Rate 11/29/16 1623 84     Resp 11/29/16 1623 18     Temp 11/29/16 1623 98.1 F (36.7 C)     Temp src --      SpO2 11/29/16 1623 96 %     Weight --      Height --      Head Circumference --      Peak Flow --      Pain Score 11/29/16 1630 7     Pain Loc --      Pain Edu? --      Excl. in [place]? --    No data found.   Updated Vital Signs BP 117/68 (BP Location: Right Arm)   Pulse 84   Temp 98.1 F (36.7 C)   Resp 18   SpO2 96%   Visual Acuity Right Eye Distance:   Left Eye Distance:   Bilateral Distance:    Right Eye Near:   Left Eye Near:    Bilateral Near:     Physical Exam  Constitutional: She is oriented to person, place, and time. She appears well-developed and well-nourished. No distress.  Musculoskeletal: She exhibits tenderness. She exhibits no edema or deformity.  Right peripatellar soreness no deformity, active slr.  Neurological: She is alert and oriented to person, place, and time.  Skin: Skin is warm and dry.    Nursing note and vitals reviewed.    UC Treatments / Results  Labs (all labs ordered are listed, but only abnormal results are displayed) Labs Reviewed - No data to display  EKG  EKG Interpretation None       Radiology No results found.  Procedures Procedures (including critical care time)  Medications Ordered in UC Medications - No data to display   Initial Impression / Assessment and Plan / UC Course  I have reviewed the triage vital signs and the nursing notes.  Pertinent labs & imaging results that were available during my care of the patient were reviewed by me and considered in my medical decision making (see chart for details).  Clinical Course       Final Clinical Impressions(s) / UC Diagnoses   Final diagnoses:  None    New Prescriptions New Prescriptions   No medications on file     Billy Fischer, MD 11/29/16 [phone]

## 2016-11-29 NOTE — ED Triage Notes (Signed)
Twisted  r  Knee  5  Days  Ago        She  Got  Her foot  Caught  In  Tile           She  Pas  Pain       In  The    Affected    Knee    She  Has  Pain in the  Knee

## 2016-11-29 NOTE — Discharge Instructions (Signed)
Ice and advil and ace for comfort until seen by Dr.Landau.

## 2017-02-07 ENCOUNTER — Emergency Department (HOSPITAL_COMMUNITY)
Admission: EM | Admit: 2017-02-07 | Discharge: 2017-02-07 | Disposition: A | Discharge status: Home / Self Care | Payer: Medicare Other | Attending: Emergency Medicine | Admitting: Emergency Medicine

## 2017-02-07 ENCOUNTER — Encounter (HOSPITAL_COMMUNITY): Payer: Self-pay

## 2017-02-07 ENCOUNTER — Emergency Department (HOSPITAL_COMMUNITY): Payer: Medicare Other

## 2017-02-07 DIAGNOSIS — D279 Benign neoplasm of unspecified ovary: Secondary | ICD-10-CM | POA: Diagnosis not present

## 2017-02-07 DIAGNOSIS — D369 Benign neoplasm, unspecified site: Secondary | ICD-10-CM

## 2017-02-07 DIAGNOSIS — Z87891 Personal history of nicotine dependence: Secondary | ICD-10-CM | POA: Diagnosis not present

## 2017-02-07 DIAGNOSIS — I10 Essential (primary) hypertension: Secondary | ICD-10-CM | POA: Diagnosis not present

## 2017-02-07 DIAGNOSIS — R1012 Left upper quadrant pain: Secondary | ICD-10-CM | POA: Diagnosis present

## 2017-02-07 DIAGNOSIS — Z79899 Other long term (current) drug therapy: Secondary | ICD-10-CM | POA: Diagnosis not present

## 2017-02-07 DIAGNOSIS — Z91040 Latex allergy status: Secondary | ICD-10-CM | POA: Diagnosis not present

## 2017-02-07 LAB — COMPREHENSIVE METABOLIC PANEL
ALBUMIN: 4.4 g/dL (ref 3.5–5.0)
ALK PHOS: 68 U/L (ref 38–126)
ALT: 22 U/L (ref 14–54)
AST: 31 U/L (ref 15–41)
Anion gap: 9 (ref 5–15)
BILIRUBIN TOTAL: 1.3 mg/dL — AB (ref 0.3–1.2)
BUN: 10 mg/dL (ref 6–20)
CO2: 26 mmol/L (ref 22–32)
Calcium: 9.2 mg/dL (ref 8.9–10.3)
Chloride: 102 mmol/L (ref 101–111)
Creatinine, Ser: 0.97 mg/dL (ref 0.44–1.00)
GFR calc Af Amer: >60 mL/min (ref >60)
GLUCOSE: 136 mg/dL — AB (ref 65–99)
POTASSIUM: 4.1 mmol/L (ref 3.5–5.1)
Sodium: 137 mmol/L (ref 135–145)
TOTAL PROTEIN: 7.9 g/dL (ref 6.5–8.1)

## 2017-02-07 LAB — CBC
HEMATOCRIT: 44 % (ref 36.0–46.0)
Hemoglobin: 15 g/dL (ref 12.0–15.0)
MCH: 30.1 pg (ref 26.0–34.0)
MCHC: 34.1 g/dL (ref 30.0–36.0)
MCV: 88.2 fL (ref 78.0–100.0)
PLATELETS: 305 10*3/uL (ref 150–400)
RBC: 4.99 MIL/uL (ref 3.87–5.11)
RDW: 13 % (ref 11.5–15.5)
WBC: 14.1 10*3/uL — AB (ref 4.0–10.5)

## 2017-02-07 LAB — LIPASE, BLOOD: Lipase: 24 U/L (ref 11–51)

## 2017-02-07 MED ORDER — HYDROMORPHONE HCL 1 MG/ML IJ SOLN
0.5000 mg | Freq: Once | INTRAMUSCULAR | Status: AC
Start: 2017-02-07 — End: 2017-02-07
  Administered 2017-02-07: 0.5 mg via INTRAVENOUS
  Filled 2017-02-07: qty 1

## 2017-02-07 MED ORDER — DICYCLOMINE HCL 20 MG PO TABS: 20.0000 mg | ORAL_TABLET | Freq: Two times a day (BID) | ORAL | 0 refills | Status: DC

## 2017-02-07 MED ORDER — IOPAMIDOL (ISOVUE-300) INJECTION 61%
INTRAVENOUS | Status: AC
  Filled 2017-02-07: qty 100

## 2017-02-07 MED ORDER — SODIUM CHLORIDE 0.9 % IJ SOLN
INTRAMUSCULAR | Status: AC
  Administered 2017-02-07: 10:00:00
  Filled 2017-02-07: qty 50

## 2017-02-07 MED ORDER — SODIUM CHLORIDE 0.9 % IV BOLUS (SEPSIS)
1000.0000 mL | Freq: Once | INTRAVENOUS | Status: AC
  Administered 2017-02-07: 1000 mL via INTRAVENOUS

## 2017-02-07 MED ORDER — ONDANSETRON HCL 4 MG/2ML IJ SOLN
4.0000 mg | Freq: Once | INTRAMUSCULAR | Status: AC
  Administered 2017-02-07: 4 mg via INTRAVENOUS
  Filled 2017-02-07: qty 2

## 2017-02-07 MED ORDER — GI COCKTAIL ~~LOC~~
30.0000 mL | Freq: Once | ORAL | Status: AC
  Administered 2017-02-07: 30 mL via ORAL
  Filled 2017-02-07: qty 30

## 2017-02-07 MED ORDER — IOPAMIDOL (ISOVUE-300) INJECTION 61%
100.0000 mL | Freq: Once | INTRAVENOUS | Status: AC | PRN
  Administered 2017-02-07: 100 mL via INTRAVENOUS

## 2017-02-07 NOTE — ED Notes (Signed)
Pt attempted to use urinal, but unable to give sample.

## 2017-02-07 NOTE — ED Notes (Signed)
Bed: [id number] Expected date:  Expected time:  Means of arrival:  Comments: 61 yo F/ EMS

## 2017-02-07 NOTE — ED Notes (Signed)
Called lab to inquire about results-stated that there were "save labels" on the tubes so no one ran blood-states results should be posted soon

## 2017-02-07 NOTE — ED Triage Notes (Signed)
Pain in abdomen since 1500 pm yesterday some nausea no vomiting states hot and cold.

## 2017-02-07 NOTE — ED Provider Notes (Signed)
[facility] DEPT Provider Note   CSN: [id number] Arrival date & time: 02/07/17  0160     History   Chief Complaint Chief Complaint  Patient presents with  . Abdominal Pain    HPI Maria [name] is a 61 y.o. female.  61 yo F with a chief complaint of left upper quadrant colicky abdominal pain. Going on since yesterday evening. Pain is been coming and going. Feels like labor pains. Denies constipation. Vomiting. Having some subjective fevers and chills. States she had a very similar presentation when she was diagnosed with a fatty liver.   The history is provided by the patient.  Abdominal Pain   This is a recurrent problem. The current episode started 2 days ago. The problem occurs constantly. The problem has been gradually worsening. The pain is associated with eating. The pain is located in the LUQ. The quality of the pain is cramping and colicky. The pain is at a severity of 8/10. The pain is severe. Associated symptoms include fever (subjective), nausea and vomiting. Pertinent negatives include dysuria, headaches, arthralgias and myalgias. Nothing aggravates the symptoms. Nothing relieves the symptoms.    Past Medical History:  Diagnosis Date  . Acute lymphadenitis 2011  . ALLERGIC RHINITIS 08/10/2007  . ANXIETY 12/06/2007  . Atherosclerotic peripheral vascular disease ([place]) 06/13/2013   Aorta on CT June 2014  . BACK PAIN 12/06/2007  . Cellulitis 2011  . Cervical disc disease 03/09/2012  . Chronic pain 03/09/2012  . DEPRESSION 12/06/2007  . DIVERTICULOSIS, [place] 12/06/2007  . GERD 12/06/2007  . Hepatitis    age 22 hepatitis A  . HYPERTENSION 12/06/2007  . Impaired glucose tolerance 03/13/2012  . LOW BACK PAIN 12/06/2007  . Lumbar disc disease 03/09/2012  . MRSA 2006  . Pain    by breast area  . Pain in joint, multiple sites 04/21/2010  . PARONYCHIA, FINGER 08/13/2009  . Pneumonia sept 2015   walking pneumonia, sepsis   . RASH-NONVESICULAR 05/09/2008  . SHOULDER PAIN,  LEFT 05/09/2008  . SINUSITIS- ACUTE-NOS 05/09/2008  . THORACIC/LUMBOSACRAL NEURITIS/RADICULITIS UNSPEC 12/28/2008    Patient Active Problem List   Diagnosis Date Noted  . Abdominal pain, epigastric   . Dysphagia   . Braid cancer screening   . Adverse effect of angiotensin-converting enzyme inhibitor 12/05/2015  . Restless leg 10/08/2015  . Dermatitis, stasis 04/26/2015  . Fibromyalgia 01/31/2015  . Acid reflux 10/17/2014  . 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 Post-traumatic osteoarthritis of both knees 09/19/2014  . Spinal stenosis 09/19/2014  . Acute respiratory failure with hypoxia ([place]) 08/20/2014  . Sepsis ([place]) 08/19/2014  . CAP (community acquired pneumonia) 08/19/2014  . Cervical disc disease 03/09/2012  . Lumbar disc disease 03/09/2012  . Chronic pain 03/09/2012  . Obesity 06/17/2011  . THORACIC/LUMBOSACRAL NEURITIS/RADICULITIS UNSPEC 12/28/2008  . ANXIETY 12/06/2007  . DEPRESSION 12/06/2007  . HYPERTENSION 12/06/2007  . LOW BACK PAIN 12/06/2007    Past Surgical History:  Procedure Laterality Date  . ABDOMINAL HYSTERECTOMY  1999   1 ovary left  . bloo clot removed from neck   may 25th , june 2. 2010  . [place] SURGERY  may 24th 2010  . COLONOSCOPY WITH PROPOFOL N/A 07/10/2016   Procedure: COLONOSCOPY WITH PROPOFOL;  Surgeon: Manus Gunning, MD;  Location: [facility];  Service: Gastroenterology;  Laterality: N/A;  . ESOPHAGOGASTRODUODENOSCOPY (EGD) WITH PROPOFOL N/A 07/10/2016   Procedure: ESOPHAGOGASTRODUODENOSCOPY (EGD) WITH PROPOFOL;  Surgeon: Manus Gunning, MD;  Location: [facility];  Service: Gastroenterology;  Laterality: N/A;  . s/p ovary cyst    . s/p right knee arthroscopy     Dr. Mardelle Matte ortho    OB History    No data available       Home Medications    Prior to Admission medications   Medication Sig Start Date End Date Taking? Authorizing Provider  ALPRAZolam Duanne Moron) 0.5 MG tablet Take 0.5 mg by mouth 2 (two)  times daily as needed for anxiety or sleep.  05/28/16  Yes Historical Provider, MD  cyclobenzaprine (FLEXERIL) 10 MG tablet Take 10 mg by mouth daily as needed for muscle spasms.  02/07/15  Yes Historical Provider, MD  fluticasone (FLONASE) 50 MCG/ACT nasal spray Place 1 spray into the nose at bedtime.  03/07/15  Yes Historical Provider, MD  furosemide (LASIX) 20 MG tablet Take 20 mg by mouth daily as needed for fluid.  06/03/15  Yes Historical Provider, MD  hydroxypropyl methylcellulose / hypromellose (ISOPTO TEARS / GONIOVISC) 2.5 % ophthalmic solution Place 1 drop into both eyes 3 (three) times daily as needed for dry eyes.   Yes Historical Provider, MD  losartan (COZAAR) 50 MG tablet Take 50 mg by mouth daily. Reported on 06/26/2016 12/05/15  Yes Historical Provider, MD  Oxycodone HCl 10 MG TABS Take 10 mg by mouth 4 (four) times daily. Pain. 12/11/15  Yes Historical Provider, MD  pantoprazole (PROTONIX) 40 MG tablet Take 1 tablet (40 mg total) by mouth 2 (two) times daily. 05/26/16  Yes Manus Gunning, MD  rOPINIRole (REQUIP) 1 MG tablet Take 2 mg by mouth at bedtime.  04/01/16  Yes Historical Provider, MD  sertraline (ZOLOFT) 50 MG tablet Take 50 mg by mouth every morning.    Yes Historical Provider, MD  triamcinolone cream (KENALOG) 0.1 % Apply 1 application topically 2 (two) times daily as needed (rash).  06/03/15  Yes Historical Provider, MD  famotidine (PEPCID) 20 MG tablet Take 1 tablet (20 mg total) by mouth 2 (two) times daily. Patient not taking: Reported on 05/28/2016 04/28/16   Recardo Evangelist, PA-C  levofloxacin (LEVAQUIN) 750 MG tablet Take 1 tablet (750 mg total) by mouth daily. Patient not taking: Reported on 02/07/2017 09/07/16   Samantha Tripp Dowless, PA-C  Na Sulfate-K Sulfate-Mg Sulf 17.5-3.13-1.6 GM/180ML SOLN Take 1 kit by mouth once. Patient not taking: Reported on 05/28/2016 05/26/16   Manus Gunning, MD  naproxen (NAPROSYN) 500 MG tablet Take 1 tablet (500 mg total) by  mouth 2 (two) times daily with a meal. Patient not taking: Reported on 02/07/2017 08/26/16   Margarita Mail, PA-C  nystatin (MYCOSTATIN/NYSTOP) powder Apply topically 4 (four) times daily. Patient not taking: Reported on 02/07/2017 09/07/16   Samantha Tripp Dowless, PA-C  ondansetron (ZOFRAN ODT) 4 MG disintegrating tablet Take 1 tablet (4 mg total) by mouth every 8 (eight) hours as needed for nausea. Patient not taking: Reported on 02/07/2017 06/30/15   Noemi Chapel, MD    Family History Family History  Problem Relation Age of Onset  . Heart disease Mother   . Hypertension Mother   . Diabetes Mother   . Heart failure Mother   . Asthma Sister   . Hypertension Father   . Asthma Daughter   . Cancer Maternal Uncle     [place]  . Cancer Other     ovarian  . Liver cancer Paternal Grandmother     Social History Social History  Substance Use Topics  . Smoking status: Former Smoker    Years: 30.00  Quit date: 11/08/2008  . Smokeless tobacco: Never Used     Comment: quit 10/09  . Alcohol use No     Allergies   Adhesive [tape]; Amoxicillin-pot clavulanate; Codeine; Lactose intolerance (gi); Morphine and related; Vicodin [hydrocodone-acetaminophen]; and Latex   Review of Systems Review of Systems  Constitutional: Positive for chills and fever (subjective).  HENT: Negative for congestion and rhinorrhea.   Eyes: Negative for redness and visual disturbance.  Respiratory: Negative for shortness of breath and wheezing.   Cardiovascular: Negative for chest pain and palpitations.  Gastrointestinal: Positive for abdominal pain, nausea and vomiting.  Genitourinary: Negative for dysuria and urgency.  Musculoskeletal: Negative for arthralgias and myalgias.  Skin: Negative for pallor and wound.  Neurological: Negative for dizziness and headaches.     Physical Exam Updated Vital Signs BP (!) 155/112 (BP Location: Left Arm)   Pulse 95   Temp 99.2 F (37.3 C) (Oral)   Resp 24   Ht '5\' 5"'   (1.651 m)   Wt (!) 330 lb (149.7 kg)   SpO2 94%   BMI 54.91 kg/m   Physical Exam  Constitutional: She is oriented to person, place, and time. She appears well-developed and well-nourished. No distress.  Appears much older than stated age  HENT:  Head: Normocephalic and atraumatic.  Eyes: EOM are normal. Pupils are equal, round, and reactive to light.  Neck: Normal range of motion. Neck supple.  Cardiovascular: Normal rate and regular rhythm.  Exam reveals no gallop and no friction rub.   No murmur heard. Pulmonary/Chest: Effort normal. She has no wheezes. She has no rales.  Abdominal: Soft. She exhibits no distension and no mass. There is tenderness (diffuse worst in the left upper quadrant). There is no guarding.  Musculoskeletal: She exhibits no edema or tenderness.  Neurological: She is alert and oriented to person, place, and time.  Skin: Skin is warm and dry. She is not diaphoretic.  Psychiatric: She has a normal mood and affect. Her behavior is normal.  Nursing note and vitals reviewed.    ED Treatments / Results  Labs (all labs ordered are listed, but only abnormal results are displayed) Labs Reviewed  COMPREHENSIVE METABOLIC PANEL - Abnormal; Notable for the following:       Result Value   Glucose, Bld 136 (*)    Total Bilirubin 1.3 (*)    All other components within normal limits  CBC - Abnormal; Notable for the following:    WBC 14.1 (*)    All other components within normal limits  URINE CULTURE  LIPASE, BLOOD  URINALYSIS, ROUTINE W REFLEX MICROSCOPIC    EKG  EKG Interpretation None       Radiology Ct Abdomen Pelvis W Contrast  Result Date: 02/07/2017 CLINICAL DATA:  Abdominal pain and nausea EXAM: CT ABDOMEN AND PELVIS WITH CONTRAST TECHNIQUE: Multidetector CT imaging of the abdomen and pelvis was performed using the standard protocol following bolus administration of intravenous contrast. CONTRAST:  100 mL ISOVUE-300 IOPAMIDOL (ISOVUE-300) INJECTION  61% COMPARISON:  March 18, 2015 FINDINGS: Lower chest: There is atelectatic change in the left base. Hepatobiliary: No focal liver lesions are appreciable on this noncontrast enhanced study. Gallbladder wall is not appreciably thickened. There is no biliary duct dilatation. Pancreas: There is no pancreatic mass or inflammatory focus. Spleen: No splenic lesions are evident. There is a tiny accessory spleen anterior to the spleen. Adrenals/Urinary Tract: Adrenals appear normal bilaterally. There is a 1 x 0.8 cm cyst in the posterior mid right kidney.  There is no hydronephrosis on either side. There is no renal or ureteral calculus on either side. Urinary bladder is midline with wall thickness within normal limits. Stomach/Bowel: There are multiple sigmoid diverticula without diverticulitis evident. Borderline wall thickness in the sigmoid [place] is stable and likely represents muscular hypertrophy from chronic diverticulosis. Elsewhere, there is no bowel wall thickening. No bowel obstruction. No free air or portal venous air. Vascular/Lymphatic: There are foci of atherosclerotic calcification in the aorta and left common iliac artery. No aneurysm. Major mesenteric vessels appear patent. No adenopathy is demonstrable in the abdomen or pelvis. Reproductive: Uterus is absent.  No evident pelvic mass. Other: There is a small mild ascites at the level of the liver. There is subtle mesenteric thickening in the mid abdomen with a small nodular appearing area in the mid abdomen just to the left of midline measuring 1.4 x 0.8 cm. This focus is best appreciated on axial slice 47 series 2, sagittal slice 299 series 5, coronal slice 79 series 4. Appendix appears normal. No abscess evident in the abdomen or pelvis. Musculoskeletal: There is degenerative change in the lumbar spine. There is spinal stenosis at L4-5 due to diffuse disc protrusion and severe bony hypertrophy. There are no blastic or lytic bone lesions. No intramuscular  or abdominal wall lesion evident. IMPRESSION: There is a slightly irregular nodular appearing area chest of left of midline in the lower abdomen as noted above. There is mild nearby mesenteric thickening. Etiology for this structure is uncertain. It should be noted that a small carcinoid could present in this manner. This finding may warrant 5-HIAA laboratory study for correlation. Nonemergent radionuclide octreotide study may also be warranted if laboratory studies suggest carcinoid as a differential consideration. Small ascites, primarily adjacent to the liver. Sigmoid diverticulosis without frank diverticulitis. No bowel obstruction. No abscess. Appendix appears normal. Uterus is absent. Spinal stenosis at L4-5, multifactorial. Mild aortoiliac atherosclerosis. Electronically Signed   By: Lowella Grip III M.D.   On: 02/07/2017 09:19    Procedures Procedures (including critical care time)  Medications Ordered in ED Medications  ondansetron (ZOFRAN) injection 4 mg (4 mg Intravenous Given 02/07/17 0718)  HYDROmorphone (DILAUDID) injection 0.5 mg (0.5 mg Intravenous Given 02/07/17 0718)  sodium chloride 0.9 % bolus 1,000 mL (0 mLs Intravenous Stopped 02/07/17 0938)  gi cocktail (Maalox,Lidocaine,Donnatal) (30 mLs Oral Given 02/07/17 0723)  iopamidol (ISOVUE-300) 61 % injection 100 mL (100 mLs Intravenous Contrast Given 02/07/17 0857)  sodium chloride 0.9 % injection (  Given 02/07/17 0938)     Initial Impression / Assessment and Plan / ED Course  I have reviewed the triage vital signs and the nursing notes.  Pertinent labs & imaging results that were available during my care of the patient were reviewed by me and considered in my medical decision making (see chart for details).     61 yo F With a chief complaint of left upper quadrant abdominal pain. Patient's pain appears very severe on exam. I'll obtain a CT scan.    No specific finding for the patient's abdominal pain was found on CT.  There was an incidental finding that may be carcinoid cancer. I discussed the results with the patient. She'll follow with her family physician. Results of the other and the patient stated that this pain is been going on for at least the past 10 years and she's not sure what's causing it. She has had a recent endoscopy. Recommend that she follow-up with her GI doctor as well  as her PCP. 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 She'll be given follow-up for hand surgery.  10:34 AM:  I have discussed the diagnosis/risks/treatment options with the patient and family and believe the pt to be eligible for discharge home to follow-up with PCP. We also discussed returning to the ED immediately if new or worsening sx occur. We discussed the sx which are most concerning (e.g., sudden worsening pain, fever, inability to tolerate by mouth) that necessitate immediate return. Medications administered to the patient during their visit and any new prescriptions provided to the patient are listed below.  Medications given during this visit Medications  ondansetron (ZOFRAN) injection 4 mg (4 mg Intravenous Given 02/07/17 0718)  HYDROmorphone (DILAUDID) injection 0.5 mg (0.5 mg Intravenous Given 02/07/17 0718)  sodium chloride 0.9 % bolus 1,000 mL (0 mLs Intravenous Stopped 02/07/17 0938)  gi cocktail (Maalox,Lidocaine,Donnatal) (30 mLs Oral Given 02/07/17 0723)  iopamidol (ISOVUE-300) 61 % injection 100 mL (100 mLs Intravenous Contrast Given 02/07/17 0857)  sodium chloride 0.9 % injection (  Given 02/07/17 3785)     The patient appears reasonably screen and/or stabilized for discharge and I doubt any other medical condition or other [facility] requiring further screening, evaluation, or treatment in the ED at this time prior to discharge.   Final Clinical Impressions(s) / ED Diagnoses   Final diagnoses:  LUQ abdominal pain  Dermoid cyst    New Prescriptions New Prescriptions   No medications on file     Deno Etienne,  DO 02/07/17 1034

## 2017-03-16 ENCOUNTER — Emergency Department (HOSPITAL_COMMUNITY): Payer: Medicare Other

## 2017-03-16 ENCOUNTER — Encounter (HOSPITAL_COMMUNITY): Payer: Self-pay

## 2017-03-16 ENCOUNTER — Emergency Department (HOSPITAL_COMMUNITY)
Admission: EM | Admit: 2017-03-16 | Discharge: 2017-03-16 | Disposition: A | Discharge status: Home / Self Care | Payer: Medicare Other | Attending: Emergency Medicine | Admitting: Emergency Medicine

## 2017-03-16 DIAGNOSIS — I880 Nonspecific mesenteric lymphadenitis: Secondary | ICD-10-CM

## 2017-03-16 DIAGNOSIS — R1013 Epigastric pain: Secondary | ICD-10-CM | POA: Diagnosis present

## 2017-03-16 DIAGNOSIS — I10 Essential (primary) hypertension: Secondary | ICD-10-CM | POA: Insufficient documentation

## 2017-03-16 DIAGNOSIS — Z87891 Personal history of nicotine dependence: Secondary | ICD-10-CM | POA: Insufficient documentation

## 2017-03-16 DIAGNOSIS — Z91040 Latex allergy status: Secondary | ICD-10-CM | POA: Diagnosis not present

## 2017-03-16 DIAGNOSIS — Z79899 Other long term (current) drug therapy: Secondary | ICD-10-CM | POA: Diagnosis not present

## 2017-03-16 DIAGNOSIS — K3184 Gastroparesis: Secondary | ICD-10-CM | POA: Diagnosis not present

## 2017-03-16 DIAGNOSIS — R1084 Generalized abdominal pain: Secondary | ICD-10-CM

## 2017-03-16 LAB — URINALYSIS, ROUTINE W REFLEX MICROSCOPIC
BILIRUBIN URINE: NEGATIVE
Glucose, UA: NEGATIVE mg/dL
HGB URINE DIPSTICK: NEGATIVE
KETONES UR: NEGATIVE mg/dL
NITRITE: NEGATIVE
PROTEIN: NEGATIVE mg/dL
Specific Gravity, Urine: 1.005 (ref 1.005–1.030)
pH: 7 (ref 5.0–8.0)

## 2017-03-16 LAB — COMPREHENSIVE METABOLIC PANEL
ALBUMIN: 4.3 g/dL (ref 3.5–5.0)
ALK PHOS: 68 U/L (ref 38–126)
ALT: 24 U/L (ref 14–54)
ANION GAP: 5 (ref 5–15)
AST: 22 U/L (ref 15–41)
BILIRUBIN TOTAL: 0.9 mg/dL (ref 0.3–1.2)
BUN: 11 mg/dL (ref 6–20)
CALCIUM: 9.1 mg/dL (ref 8.9–10.3)
CO2: 29 mmol/L (ref 22–32)
Chloride: 104 mmol/L (ref 101–111)
Creatinine, Ser: 0.87 mg/dL (ref 0.44–1.00)
GFR calc non Af Amer: >60 mL/min (ref >60)
GLUCOSE: 88 mg/dL (ref 65–99)
POTASSIUM: 3.8 mmol/L (ref 3.5–5.1)
SODIUM: 138 mmol/L (ref 135–145)
TOTAL PROTEIN: 7.4 g/dL (ref 6.5–8.1)

## 2017-03-16 LAB — CBC
HEMATOCRIT: 40.7 % (ref 36.0–46.0)
HEMOGLOBIN: 13.4 g/dL (ref 12.0–15.0)
MCH: 29.6 pg (ref 26.0–34.0)
MCHC: 32.9 g/dL (ref 30.0–36.0)
MCV: 90 fL (ref 78.0–100.0)
Platelets: 232 10*3/uL (ref 150–400)
RBC: 4.52 MIL/uL (ref 3.87–5.11)
RDW: 13.2 % (ref 11.5–15.5)
WBC: 8.1 10*3/uL (ref 4.0–10.5)

## 2017-03-16 LAB — LIPASE, BLOOD: Lipase: 25 U/L (ref 11–51)

## 2017-03-16 MED ORDER — DIPHENHYDRAMINE HCL 50 MG/ML IJ SOLN
25.0000 mg | Freq: Once | INTRAMUSCULAR | Status: AC
  Administered 2017-03-16: 25 mg via INTRAVENOUS
  Filled 2017-03-16: qty 1

## 2017-03-16 MED ORDER — IOPAMIDOL (ISOVUE-300) INJECTION 61%
100.0000 mL | Freq: Once | INTRAVENOUS | Status: AC | PRN
  Administered 2017-03-16: 100 mL via INTRAVENOUS

## 2017-03-16 MED ORDER — DICYCLOMINE HCL 20 MG PO TABS: 20.0000 mg | ORAL_TABLET | Freq: Three times a day (TID) | ORAL | 0 refills | Status: DC

## 2017-03-16 MED ORDER — DICYCLOMINE HCL 10 MG PO CAPS
20.0000 mg | ORAL_CAPSULE | Freq: Once | ORAL | Status: AC
  Administered 2017-03-16: 20 mg via ORAL
  Filled 2017-03-16: qty 2

## 2017-03-16 MED ORDER — METOCLOPRAMIDE HCL 5 MG/ML IJ SOLN
10.0000 mg | Freq: Once | INTRAMUSCULAR | Status: AC
  Administered 2017-03-16: 10 mg via INTRAVENOUS
  Filled 2017-03-16: qty 2

## 2017-03-16 MED ORDER — IOPAMIDOL (ISOVUE-300) INJECTION 61%
INTRAVENOUS | Status: AC
  Filled 2017-03-16: qty 100

## 2017-03-16 MED ORDER — ONDANSETRON 4 MG PO TBDP: 4.0000 mg | ORAL_TABLET | Freq: Three times a day (TID) | ORAL | 0 refills | Status: DC | PRN

## 2017-03-16 NOTE — Discharge Instructions (Signed)
-  Your CT scan shows enlarged lymph nodes throughout your abdomen. This, along with your other findings, is concerning for mesenteric adenitis. -You should call your GI physician for follow-up, to look into your mesenteric adenitis as well as possible carcinoid syndrome/tumor -Try to eat small, frequent meals -Stay upright after meals for at least 30 minutes

## 2017-03-16 NOTE — ED Notes (Signed)
Patient was alert, oriented and stable upon discharge. RN went over AVS and patient had no further questions.  

## 2017-03-16 NOTE — ED Provider Notes (Signed)
[facility] DEPT Provider Note   CSN: [id number] Arrival date & time: 03/16/17  1325     History   Chief Complaint Chief Complaint  Patient presents with  . Abdominal Pain  . Nausea  . Bloated  . Leg Swelling    HPI Maria [name] is a 61 y.o. female.  HPI   61 year old female with past medical history of chronic abdominal pain here with ongoing abdominal pain. The patient is frustrated and tearful during interview. She states that over the last 4-5 years, she has had intermittent, persistent, epigastric abdominal pain. The pain is significantly worse with eating as well as palpation. She states that she feels intermittent flushing, fullness sensation in her abdomen, followed by vomiting or diarrhea. These episodes seem to come and go. She is seeing her primary care doctor as well as a GI physician for this, though she has not been scoped recently. Of note, she did have recent CT scan concerning for possible carcinoid tumor. She had a 24-hour serotonin test which reportedly was normal.  Past Medical History:  Diagnosis Date  . Acute lymphadenitis 2011  . ALLERGIC RHINITIS 08/10/2007  . ANXIETY 12/06/2007  . Atherosclerotic peripheral vascular disease ([place]) 06/13/2013   Aorta on CT June 2014  . BACK PAIN 12/06/2007  . Cellulitis 2011  . Cervical disc disease 03/09/2012  . Chronic pain 03/09/2012  . DEPRESSION 12/06/2007  . DIVERTICULOSIS, [place] 12/06/2007  . GERD 12/06/2007  . Hepatitis    age 61 hepatitis A  . HYPERTENSION 12/06/2007  . Impaired glucose tolerance 03/13/2012  . LOW BACK PAIN 12/06/2007  . Lumbar disc disease 03/09/2012  . MRSA 2006  . Pain    by breast area  . Pain in joint, multiple sites 04/21/2010  . PARONYCHIA, FINGER 08/13/2009  . Pneumonia sept 2015   walking pneumonia, sepsis   . RASH-NONVESICULAR 05/09/2008  . SHOULDER PAIN, LEFT 05/09/2008  . SINUSITIS- ACUTE-NOS 05/09/2008  . THORACIC/LUMBOSACRAL NEURITIS/RADICULITIS UNSPEC 12/28/2008     Patient Active Problem List   Diagnosis Date Noted  . Abdominal pain, epigastric   . Dysphagia   . [place] cancer screening   . Adverse effect of angiotensin-converting enzyme inhibitor 12/05/2015  . Restless leg 10/08/2015  . Dermatitis, stasis 04/26/2015  . Fibromyalgia 01/31/2015  . Acid reflux 10/17/2014  . 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 Post-traumatic osteoarthritis of both knees 09/19/2014  . Spinal stenosis 09/19/2014  . Acute respiratory failure with hypoxia ([place]) 08/20/2014  . Sepsis ([place]) 08/19/2014  . CAP (community acquired pneumonia) 08/19/2014  . Cervical disc disease 03/09/2012  . Lumbar disc disease 03/09/2012  . Chronic pain 03/09/2012  . Obesity 06/17/2011  . THORACIC/LUMBOSACRAL NEURITIS/RADICULITIS UNSPEC 12/28/2008  . ANXIETY 12/06/2007  . DEPRESSION 12/06/2007  . HYPERTENSION 12/06/2007  . LOW BACK PAIN 12/06/2007    Past Surgical History:  Procedure Laterality Date  . ABDOMINAL HYSTERECTOMY  1999   1 ovary left  . bloo clot removed from neck   may 25th , june 2. 2010  . [place] SURGERY  may 24th 2010  . COLONOSCOPY WITH PROPOFOL N/A 07/10/2016   Procedure: COLONOSCOPY WITH PROPOFOL;  Surgeon: Manus Gunning, MD;  Location: [facility];  Service: Gastroenterology;  Laterality: N/A;  . ESOPHAGOGASTRODUODENOSCOPY (EGD) WITH PROPOFOL N/A 07/10/2016   Procedure: ESOPHAGOGASTRODUODENOSCOPY (EGD) WITH PROPOFOL;  Surgeon: Manus Gunning, MD;  Location: [facility];  Service: Gastroenterology;  Laterality: N/A;  . s/p ovary cyst    . s/p right knee arthroscopy  Dr. Mardelle Matte ortho    OB History    No data available       Home Medications    Prior to Admission medications   Medication Sig Start Date End Date Taking? Authorizing Provider  clonazePAM (KLONOPIN) 1 MG tablet Take 1 mg by mouth 2 (two) times daily. 03/13/17  Yes Historical Provider, MD  cyclobenzaprine (FLEXERIL) 10 MG tablet Take 10 mg by mouth  daily as needed for muscle spasms.  02/07/15  Yes Historical Provider, MD  fluticasone (FLONASE) 50 MCG/ACT nasal spray Place 1 spray into the nose at bedtime.  03/07/15  Yes Historical Provider, MD  furosemide (LASIX) 20 MG tablet Take 20 mg by mouth daily as needed for fluid.  06/03/15  Yes Historical Provider, MD  hydroxypropyl methylcellulose / hypromellose (ISOPTO TEARS / GONIOVISC) 2.5 % ophthalmic solution Place 1 drop into both eyes 3 (three) times daily as needed for dry eyes.   Yes Historical Provider, MD  losartan (COZAAR) 50 MG tablet Take 50 mg by mouth daily. Reported on 06/26/2016 12/05/15  Yes Historical Provider, MD  Oxycodone HCl 10 MG TABS Take 10 mg by mouth 4 (four) times daily. Pain. 12/11/15  Yes Historical Provider, MD  pantoprazole (PROTONIX) 40 MG tablet Take 1 tablet (40 mg total) by mouth 2 (two) times daily. 05/26/16  Yes Manus Gunning, MD  rOPINIRole (REQUIP) 1 MG tablet Take 2 mg by mouth at bedtime.  04/01/16  Yes Historical Provider, MD  sertraline (ZOLOFT) 50 MG tablet Take 50 mg by mouth every morning.    Yes Historical Provider, MD  triamcinolone cream (KENALOG) 0.1 % Apply 1 application topically 2 (two) times daily as needed (rash).  06/03/15  Yes Historical Provider, MD  dicyclomine (BENTYL) 20 MG tablet Take 1 tablet (20 mg total) by mouth 4 (four) times daily -  before meals and at bedtime. 03/16/17 03/26/17  Duffy Bruce, MD  famotidine (PEPCID) 20 MG tablet Take 1 tablet (20 mg total) by mouth 2 (two) times daily. Patient not taking: Reported on 05/28/2016 04/28/16   Recardo Evangelist, PA-C  levofloxacin (LEVAQUIN) 750 MG tablet Take 1 tablet (750 mg total) by mouth daily. Patient not taking: Reported on 02/07/2017 09/07/16   Samantha Tripp Dowless, PA-C  Na Sulfate-K Sulfate-Mg Sulf 17.5-3.13-1.6 GM/180ML SOLN Take 1 kit by mouth once. Patient not taking: Reported on 05/28/2016 05/26/16   Manus Gunning, MD  naproxen (NAPROSYN) 500 MG tablet Take 1 tablet  (500 mg total) by mouth 2 (two) times daily with a meal. Patient not taking: Reported on 02/07/2017 08/26/16   Margarita Mail, PA-C  nystatin (MYCOSTATIN/NYSTOP) powder Apply topically 4 (four) times daily. Patient not taking: Reported on 02/07/2017 09/07/16   Aldona Bar Tripp Dowless, PA-C  ondansetron (ZOFRAN ODT) 4 MG disintegrating tablet Take 1 tablet (4 mg total) by mouth every 8 (eight) hours as needed for nausea or vomiting. 03/16/17   Duffy Bruce, MD    Family History Family History  Problem Relation Age of Onset  . Heart disease Mother   . Hypertension Mother   . Diabetes Mother   . Heart failure Mother   . Asthma Sister   . Hypertension Father   . Asthma Daughter   . Cancer Maternal Uncle     [place]  . Cancer Other     ovarian  . Liver cancer Paternal Grandmother     Social History Social History  Substance Use Topics  . Smoking status: Former Smoker    Years:  30.00    Quit date: 11/08/2008  . Smokeless tobacco: Never Used     Comment: quit 10/09  . Alcohol use No     Allergies   Adhesive [tape]; Amoxicillin-pot clavulanate; Codeine; Lactose intolerance (gi); Morphine and related; Vicodin [hydrocodone-acetaminophen]; and Latex   Review of Systems Review of Systems  Constitutional: Positive for fatigue. Negative for chills and fever.  HENT: Negative for congestion and rhinorrhea.   Eyes: Negative for visual disturbance.  Respiratory: Negative for cough, shortness of breath and wheezing.   Cardiovascular: Negative for chest pain and leg swelling.  Gastrointestinal: Positive for abdominal pain, diarrhea, nausea and vomiting.  Genitourinary: Negative for dysuria and flank pain.  Musculoskeletal: Negative for neck pain and neck stiffness.  Skin: Negative for rash and wound.  Allergic/Immunologic: Negative for immunocompromised state.  Neurological: Negative for syncope, weakness and headaches.  All other systems reviewed and are negative.    Physical  Exam Updated Vital Signs BP (!) 141/81 (BP Location: Left Arm)   Pulse 79   Temp 97.9 F (36.6 C) (Oral)   Resp 19   Ht '5\' 5"'  (1.651 m)   Wt (!) 334 lb (151.5 kg)   SpO2 100%   BMI 55.58 kg/m   Physical Exam  Constitutional: She is oriented to person, place, and time. She appears well-developed and well-nourished. No distress.  HENT:  Head: Normocephalic and atraumatic.  Eyes: Conjunctivae are normal.  Neck: Neck supple.  Cardiovascular: Normal rate, regular rhythm and normal heart sounds.  Exam reveals no friction rub.   No murmur heard. Pulmonary/Chest: Effort normal and breath sounds normal. No respiratory distress. She has no wheezes. She has no rales.  Abdominal: Normal appearance and bowel sounds are normal. She exhibits no distension. There is generalized tenderness. There is no rigidity, no rebound and no guarding.  Musculoskeletal: She exhibits no edema.  Neurological: She is alert and oriented to person, place, and time. She exhibits normal muscle tone.  Skin: Skin is warm. Capillary refill takes less than 2 seconds.  Psychiatric: She has a normal mood and affect.  Nursing note and vitals reviewed.    ED Treatments / Results  Labs (all labs ordered are listed, but only abnormal results are displayed) Labs Reviewed  URINALYSIS, ROUTINE W REFLEX MICROSCOPIC - Abnormal; Notable for the following:       Result Value   Color, Urine STRAW (*)    Leukocytes, UA TRACE (*)    Bacteria, UA RARE (*)    Squamous Epithelial / LPF 0-5 (*)    All other components within normal limits  LIPASE, BLOOD  COMPREHENSIVE METABOLIC PANEL  CBC    EKG  EKG Interpretation None       Radiology Ct Abdomen Pelvis W Contrast  Result Date: 03/16/2017 CLINICAL DATA:  Abdominal pain and bloating since last evening EXAM: CT ABDOMEN AND PELVIS WITH CONTRAST TECHNIQUE: Multidetector CT imaging of the abdomen and pelvis was performed using the standard protocol following bolus  administration of intravenous contrast. CONTRAST:  1109m ISOVUE-300 IOPAMIDOL (ISOVUE-300) INJECTION 61% COMPARISON:  02/07/2017 CT FINDINGS: Lower chest: Minimal atelectasis and/or scarring along the periphery left lower lobe. No pulmonary consolidation, effusion or pneumothorax. The visualized cardiac chambers are within normal limits for size. No pericardial effusion. Hepatobiliary: Mild hepatic steatosis. No space-occupying mass or biliary dilatation. Unremarkable gallbladder. Pancreas: Unremarkable. No pancreatic ductal dilatation or surrounding inflammatory changes.NormalNormal in size without focal abnormality. Spleen: Normal size spleen. No space-occupying mass. Small adjacent splenule. Adrenals/Urinary Tract: Adrenal glands  are unremarkable. Kidneys are normal, without renal calculi, focal lesion, or hydronephrosis. Bladder is unremarkable. Stomach/Bowel: Normal appendix. No bowel obstruction or inflammation. Increased colonic stool burden. Sigmoid diverticulosis without acute diverticulitis. Normal small bowel rotation. Slightly contracted stomach. Vascular/Lymphatic: Numerous small mesenteric lymph nodes at the root of the mesentery with surrounding hazy appearance of the mesenteric fat. 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 Lymphoproliferative disorder not entirely excluded accounting for the numerous subcentimeter short axis lymph nodes. Reproductive: Hysterectomy.  No adnexal mass. Other: Small fat containing umbilical hernia. Resolution of trace ascites overlying the liver. Musculoskeletal: Degenerative change of the lumbar spine with spinal stenosis at L3-4 and L4-5 as before. Findings are likely related to facet hypertrophy in combination with diffuse disc protrusions. No suspicious nor acute osseous abnormality. IMPRESSION: 1. Hepatic steatosis without space-occupying mass 2. Numerous small subcentimeter mesenteric lymph nodes at the root of the mesentery with slight mesenteric  fatty haziness. 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 Lymphoproliferative disorder given the number lymph nodes is not entirely excluded. 3. Spinal stenosis L3-4 and L4-5. 4. Resolution of small volume ascites since prior exam. 5. Increased colonic stool burden. Sigmoid diverticulosis. No bowel obstruction or acute inflammation. Electronically Signed   By: Ashley Royalty M.D.   On: 03/16/2017 17:26    Procedures Procedures (including critical care time)  Medications Ordered in ED Medications  metoCLOPramide (REGLAN) injection 10 mg (10 mg Intravenous Given 03/16/17 1639)  diphenhydrAMINE (BENADRYL) injection 25 mg (25 mg Intravenous Given 03/16/17 1639)  dicyclomine (BENTYL) capsule 20 mg (20 mg Oral Given 03/16/17 1640)  iopamidol (ISOVUE-300) 61 % injection 100 mL (100 mLs Intravenous Contrast Given 03/16/17 1647)     Initial Impression / Assessment and Plan / ED Course  I have reviewed the triage vital signs and the nursing notes.  Pertinent labs & imaging results that were available during my care of the patient were reviewed by me and considered in my medical decision making (see chart for details).    61 yo F with PMHx chronic abdominal pain here with acute on chronic abdominal bloating and pain. On arrival, VSS and WNL. Exam is as above. Etiology of her pain is unclear. 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 WBC normal, no signs of leukemia/lymphoma on labs. No evidence of obstruction or other acute process on CT scan. No enlarging mass. No apparent ovarian mass/pathology and Na normal, no evidence to suggest peritoneal carcinoma or ovarian pathology. I discussed labs, imaging, and need for close GI f/u with pt in detail. Her sx are improved here. Given normal VS, reassuring labs, unremarkable CT, and chronicity of sx, believe pt is safe and stable  for d/c home.  Final Clinical Impressions(s) / ED Diagnoses   Final diagnoses:  Diffuse abdominal pain  Mesenteric adenitis  Gastroparesis    New Prescriptions Discharge Medication List as of 03/16/2017  8:02 PM       Duffy Bruce, MD 03/17/17 [phone]

## 2017-03-16 NOTE — ED Notes (Signed)
Pt obtained urine sample in triage

## 2017-03-16 NOTE — ED Triage Notes (Signed)
Patient c/o abdominal bloating, mid abdominal pain that radiates into the mid back since last night. Patient c/o nausea last night, but none today. Patient also h as bilateral leg swelling.

## 2017-03-30 ENCOUNTER — Encounter: Payer: Self-pay | Admitting: Physician Assistant

## 2017-03-30 ENCOUNTER — Ambulatory Visit (INDEPENDENT_AMBULATORY_CARE_PROVIDER_SITE_OTHER): Payer: Medicare Other | Admitting: Physician Assistant

## 2017-03-30 ENCOUNTER — Other Ambulatory Visit (INDEPENDENT_AMBULATORY_CARE_PROVIDER_SITE_OTHER): Payer: Medicare Other

## 2017-03-30 VITALS — BP 112/62 | HR 84 | Ht 64.5 in | Wt 336.8 lb

## 2017-03-30 DIAGNOSIS — R1010 Upper abdominal pain, unspecified: Secondary | ICD-10-CM | POA: Diagnosis not present

## 2017-03-30 DIAGNOSIS — R938 Abnormal findings on diagnostic imaging of other specified body structures: Secondary | ICD-10-CM

## 2017-03-30 DIAGNOSIS — R9389 Abnormal findings on diagnostic imaging of other specified body structures: Secondary | ICD-10-CM

## 2017-03-30 LAB — SEDIMENTATION RATE: Sed Rate: 19 mm/hr (ref 0–30)

## 2017-03-30 LAB — C-REACTIVE PROTEIN: CRP: 0.6 mg/dL (ref 0.5–20.0)

## 2017-03-30 MED ORDER — DICYCLOMINE HCL 10 MG PO CAPS: 10.0000 mg | ORAL_CAPSULE | ORAL | 3 refills | Status: DC

## 2017-03-30 NOTE — Progress Notes (Signed)
Subjective:    Patient ID: Maria [name], female    DOB: June 15, 1956, 61 y.o.   MRN: [id number]  HPI Maria [name]  is a pleasant 61 year old white female known to Dr. Havery Moros who was last seen in our office in June 2017. She has multiple  medical issues including fibromyalgia, cervical and lumbar disc disease, on a pain syndrome for which she is on oxycodone chronically, restless leg syndrome, hypertension, morbid obesity, and chronic anxiety. She comes in today with complaints of abdominal pain with an ER visit in February and again in March 2018 with similar complaints. She had undergone EGD and colonoscopy in June 2017. Endo was negative and colonoscopy with several Riccardi polyps largest a 1 cm cecal polyp. All were adenomatous and she was recommended for 3 year interval follow-up. She also has diverticulosis. Vision says her current pain is been present at least over the past year and she describes episodes of severe mid upper abdominal pain which she describes as like contractions. She says her abdomen will get very distended and the pain is severe and associated with visible abdominal distention. She will frequently have vomiting with these episodes. The last 2 episodes took her to the emergency room. In between these episodes she has ongoing upper abdominal tenderness and discomfort over the past several months. Her appetite has been fair she's eating very soft foods over the past month or so and her weight is down 23 pounds since October. Is not having any significant problems with constipation or diarrhea and no melena or hematochezia. Last ER visit 03/16/2017 CT of the abdomen and pelvis was done with contrast which shows mild hepatic steatosis sigmoid diverticulosis and numerous small mesenteric nodes at the root of the mesentery haziness of the mesenteric fat felt to be consistent with sclerosing mesentery right as not rule out lymphoproliferative disorder. She had similar findings on CT scan  the month previous in February 2018. These findings were not noted on CT scan in 2016. Her primary care physician did 24-hour urine for 5-HIAA which was normal at 2.7 in February 2018.  Review of Systems Pertinent positive and negative review of systems were noted in the above HPI section.  All other review of systems was otherwise negative.  Outpatient Encounter Prescriptions as of 03/30/2017  Medication Sig  . clonazePAM (KLONOPIN) 1 MG tablet Take 1 mg by mouth 2 (two) times daily.  . cyclobenzaprine (FLEXERIL) 10 MG tablet Take 10 mg by mouth daily as needed for muscle spasms.   . fluticasone (FLONASE) 50 MCG/ACT nasal spray Place 1 spray into the nose at bedtime.   . furosemide (LASIX) 20 MG tablet Take 20 mg by mouth daily as needed for fluid.   . hydroxypropyl methylcellulose / hypromellose (ISOPTO TEARS / GONIOVISC) 2.5 % ophthalmic solution Place 1 drop into both eyes 3 (three) times daily as needed for dry eyes.  Marland Kitchen losartan (COZAAR) 50 MG tablet Take 50 mg by mouth daily. Reported on 06/26/2016  . Na Sulfate-K Sulfate-Mg Sulf 17.5-3.13-1.6 GM/180ML SOLN Take 1 kit by mouth once.  . ondansetron (ZOFRAN ODT) 4 MG disintegrating tablet Take 1 tablet (4 mg total) by mouth every 8 (eight) hours as needed for nausea or vomiting.  . Oxycodone HCl 10 MG TABS Take 10 mg by mouth 4 (four) times daily. Pain.  . pantoprazole (PROTONIX) 40 MG tablet Take 1 tablet (40 mg total) by mouth 2 (two) times daily.  Marland Kitchen rOPINIRole (REQUIP) 1 MG tablet Take 2 mg by mouth  at bedtime.   . sertraline (ZOLOFT) 50 MG tablet Take 50 mg by mouth every morning.   . triamcinolone cream (KENALOG) 0.1 % Apply 1 application topically 2 (two) times daily as needed (rash).   Marland Kitchen dicyclomine (BENTYL) 10 MG capsule Take 1 capsule (10 mg total) by mouth every morning.  . [DISCONTINUED] dicyclomine (BENTYL) 20 MG tablet Take 1 tablet (20 mg total) by mouth 4 (four) times daily -  before meals and at bedtime.  . [DISCONTINUED]  famotidine (PEPCID) 20 MG tablet Take 1 tablet (20 mg total) by mouth 2 (two) times daily. (Patient not taking: Reported on 05/28/2016)  . [DISCONTINUED] levofloxacin (LEVAQUIN) 750 MG tablet Take 1 tablet (750 mg total) by mouth daily. (Patient not taking: Reported on 02/07/2017)  . [DISCONTINUED] naproxen (NAPROSYN) 500 MG tablet Take 1 tablet (500 mg total) by mouth 2 (two) times daily with a meal. (Patient not taking: Reported on 02/07/2017)  . [DISCONTINUED] nystatin (MYCOSTATIN/NYSTOP) powder Apply topically 4 (four) times daily. (Patient not taking: Reported on 02/07/2017)   No facility-administered encounter medications on file as of 03/30/2017.    Allergies  Allergen Reactions  . Adhesive [Tape] Other (See Comments)    Tears skin off - use paper tape   . Amoxicillin-Pot Clavulanate Nausea And Vomiting  . Codeine Other (See Comments)    hallucinations  . Lactose Intolerance (Gi) Nausea And Vomiting  . Morphine And Related Nausea And Vomiting and Other (See Comments)    headaches  . Vicodin [Hydrocodone-Acetaminophen] Other (See Comments)    hallucinations  . Latex Rash   Patient Active Problem List   Diagnosis Date Noted  . Abdominal pain, epigastric   . Dysphagia   . [place] cancer screening   . Adverse effect of angiotensin-converting enzyme inhibitor 12/05/2015  . Restless leg 10/08/2015  . Dermatitis, stasis 04/26/2015  . Fibromyalgia 01/31/2015  . Acid reflux 10/17/2014  . 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 Post-traumatic osteoarthritis of both knees 09/19/2014  . Spinal stenosis 09/19/2014  . Acute respiratory failure with hypoxia ([place]) 08/20/2014  . Sepsis ([place]) 08/19/2014  . CAP (community acquired pneumonia) 08/19/2014  . Cervical disc disease 03/09/2012  . Lumbar disc disease 03/09/2012  . Chronic pain 03/09/2012  . Obesity 06/17/2011  . THORACIC/LUMBOSACRAL NEURITIS/RADICULITIS UNSPEC 12/28/2008  . ANXIETY 12/06/2007  . DEPRESSION 12/06/2007  .  HYPERTENSION 12/06/2007  . LOW BACK PAIN 12/06/2007   Social History   Social History  . Marital status: Divorced    Spouse name: N/A  . Number of children: 2  . Years of education: N/A   Occupational History  . [employer]  . asst [employer]    Social History Main Topics  . Smoking status: Former Smoker    Years: 30.00    Quit date: 11/08/2008  . Smokeless tobacco: Never Used     Comment: quit 10/09  . Alcohol use No  . Drug use: No  . Sexual activity: No   Other Topics Concern  . Not on file   Social History Narrative  . No narrative on file    Ms. Maya's family history includes Asthma in her daughter and sister; Cancer in her maternal uncle and other; Diabetes in her mother; Heart disease in her mother; Heart failure in her mother; Hypertension in her father and mother; Liver cancer in her paternal grandmother.      Objective:    Vitals:   03/30/17 1339  BP: 112/62  Pulse: 84  Physical Exam   well-developed older white female in no acute distress, pleasant blood pressure 112/62 pulse 84, BMI 56.9, weight 336. HEENT; nontraumatic normocephalic EOMI PERRLA sclera anicteric, Cardiovascular ;and rhythm with S1-S2 no murmur rub or gallop, Pulmonary; clear bilaterally, Abdomen; morbidly obese, soft realized discomfort in the upper abdomen and epigastrium there is no palpable mass or hepatosplenomegaly bowel sounds are present. Rectal ;exam not done, Extremities; no clubbing cyanosis or edema skin warm and dry, Neuropsych; mood and affect appropriate       Assessment & Plan:   #22 61 year old female with over 1 year history of recurrent episodes of severe upper abdominal pain was sedated with abdominal distention nausea and sometimes vomiting. 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 Havery Moros. Have reviewed management, and generally requires laparotomy with biopsy for definitive diagnosis as treatment involves immunosuppression with azathioprine and also often treated with tamoxifen. I believe she will need surgical referral. We'll check sedimentation rate and CRP Continue Bentyl 10 mg by mouth every morning She is on chronic oxycodone Continue Protonix 40 mg twice a day.   Amy S Esterwood PA-C 03/30/2017   Cc: Leamon Arnt, MD

## 2017-03-30 NOTE — Patient Instructions (Signed)
Your physician has requested that you go to the basement for lab work before leaving today.  We have sent the following medications to your pharmacy for you to pick up at your convenience: Bentyl 10 mg every morning

## 2017-03-31 NOTE — Progress Notes (Signed)
Agree with assessment and plan as outlined. CT changes since 2018 appear new. Sclerosing mesenteritis is on the differential to cause these CT changes, along with lymphoma, carcinoid tumor, mesenteric fibromatosis, and other causes of mesenteric edema. If her pain is persistent and worsening, a surgical evaluation to consider ex-lap with mesenteric biopsy may be reasonable and agree with surgical consultation. If she actually has sclerosing mesenteritis, treatment is for control of symptoms, as it does not alter disease course of prognosis, and options would include steroids, tamoxifen, thiopurines, etc.

## 2017-04-01 ENCOUNTER — Telehealth: Payer: Self-pay

## 2017-04-01 NOTE — Progress Notes (Signed)
Gregary Signs- please call pt and let her know the labs I did were normal- I discussed her case with Dr Havery Moros , and he agreed that she needs surgical consultation for lap with bx of mesentery. Please get her an appt at [facility] surgery for consult , and sent records - thank you

## 2017-04-01 NOTE — Telephone Encounter (Signed)
-----   Message from Doristine Counter, RN sent at 04/01/2017 12:28 PM EDT -----   ----- Message ----- From: Alfredia Ferguson, PA-C Sent: 04/01/2017  11:55 AM To: Manus Gunning, MD, #    ----- Message ----- From: Manus Gunning, MD Sent: 03/31/2017   5:25 PM To: Alfredia Ferguson, PA-C    ----- Message ----- From: Alfredia Ferguson, PA-C Sent: 03/30/2017   5:41 PM To: Manus Gunning, MD

## 2017-04-01 NOTE — Telephone Encounter (Signed)
please call pt and let her know the labs I did were normal- I discussed her case with Dr Havery Moros , and he agreed that she needs surgical consultation for lap with bx of mesentery. Please get her an appt at [facility] for consult , and sent records - thank you Left message for the patient to call back. Records faxed to [facility].

## 2017-04-01 NOTE — Telephone Encounter (Signed)
Patient states she is returning phone call to [facility]. Best call back number is [phone].

## 2017-04-02 NOTE — Telephone Encounter (Signed)
The patient has been notified. She is in agreement with this plan. Her records are faxed to [facility].

## 2017-05-31 DIAGNOSIS — K654 Sclerosing mesenteritis: Secondary | ICD-10-CM | POA: Insufficient documentation

## 2017-08-31 ENCOUNTER — Telehealth: Payer: Self-pay | Admitting: Internal Medicine

## 2017-08-31 NOTE — Telephone Encounter (Signed)
Patient would like to know if she could establish again with Dr. [name] Reichmann?

## 2017-08-31 NOTE — Telephone Encounter (Signed)
Ok with me, but must be OK with current PCP, and please be aware that I do not prescribe chronic pain mediciatons such as vicodin or stronger

## 2017-09-01 NOTE — Telephone Encounter (Signed)
Patient states she will call back to make an appt to establish care

## 2017-09-01 NOTE — Telephone Encounter (Addendum)
Pt informed of response below. Will call back to make an appointment. Pt is currently in pain management.

## 2017-09-20 DIAGNOSIS — M2550 Pain in unspecified joint: Secondary | ICD-10-CM | POA: Diagnosis not present

## 2017-09-20 DIAGNOSIS — K654 Sclerosing mesenteritis: Secondary | ICD-10-CM | POA: Diagnosis not present

## 2017-09-20 DIAGNOSIS — Z7952 Long term (current) use of systemic steroids: Secondary | ICD-10-CM | POA: Diagnosis not present

## 2017-09-27 DIAGNOSIS — M25561 Pain in right knee: Secondary | ICD-10-CM | POA: Diagnosis not present

## 2017-09-27 DIAGNOSIS — G894 Chronic pain syndrome: Secondary | ICD-10-CM | POA: Diagnosis not present

## 2017-09-27 DIAGNOSIS — M79661 Pain in right lower leg: Secondary | ICD-10-CM | POA: Diagnosis not present

## 2017-09-27 DIAGNOSIS — M25562 Pain in left knee: Secondary | ICD-10-CM | POA: Diagnosis not present

## 2017-09-27 DIAGNOSIS — M545 Low back pain: Secondary | ICD-10-CM | POA: Diagnosis not present

## 2017-10-27 DIAGNOSIS — M25562 Pain in left knee: Secondary | ICD-10-CM | POA: Diagnosis not present

## 2017-10-27 DIAGNOSIS — M25561 Pain in right knee: Secondary | ICD-10-CM | POA: Diagnosis not present

## 2017-10-27 DIAGNOSIS — G894 Chronic pain syndrome: Secondary | ICD-10-CM | POA: Diagnosis not present

## 2017-10-27 DIAGNOSIS — M545 Low back pain: Secondary | ICD-10-CM | POA: Diagnosis not present

## 2017-10-27 DIAGNOSIS — M79661 Pain in right lower leg: Secondary | ICD-10-CM | POA: Diagnosis not present

## 2017-11-08 ENCOUNTER — Ambulatory Visit (INDEPENDENT_AMBULATORY_CARE_PROVIDER_SITE_OTHER): Payer: Medicare Other | Admitting: Internal Medicine

## 2017-11-08 ENCOUNTER — Encounter: Payer: Self-pay | Admitting: Internal Medicine

## 2017-11-08 ENCOUNTER — Other Ambulatory Visit (INDEPENDENT_AMBULATORY_CARE_PROVIDER_SITE_OTHER): Payer: Medicare Other

## 2017-11-08 VITALS — BP 126/82 | HR 91 | Temp 97.9°F | Ht 64.5 in | Wt 357.0 lb

## 2017-11-08 DIAGNOSIS — Z0000 Encounter for general adult medical examination without abnormal findings: Secondary | ICD-10-CM

## 2017-11-08 DIAGNOSIS — Z0001 Encounter for general adult medical examination with abnormal findings: Secondary | ICD-10-CM | POA: Insufficient documentation

## 2017-11-08 DIAGNOSIS — K654 Sclerosing mesenteritis: Secondary | ICD-10-CM

## 2017-11-08 DIAGNOSIS — I10 Essential (primary) hypertension: Secondary | ICD-10-CM | POA: Diagnosis not present

## 2017-11-08 DIAGNOSIS — F32A Depression, unspecified: Secondary | ICD-10-CM

## 2017-11-08 DIAGNOSIS — J069 Acute upper respiratory infection, unspecified: Secondary | ICD-10-CM

## 2017-11-08 DIAGNOSIS — F329 Major depressive disorder, single episode, unspecified: Secondary | ICD-10-CM

## 2017-11-08 DIAGNOSIS — Z23 Encounter for immunization: Secondary | ICD-10-CM

## 2017-11-08 DIAGNOSIS — G2581 Restless legs syndrome: Secondary | ICD-10-CM | POA: Diagnosis not present

## 2017-11-08 HISTORY — DX: Sclerosing mesenteritis: K65.4

## 2017-11-08 LAB — LIPID PANEL
CHOL/HDL RATIO: 3
Cholesterol: 201 mg/dL — ABNORMAL HIGH (ref 0–200)
HDL: 60.1 mg/dL (ref >39.00)
LDL Cholesterol: 104 mg/dL — ABNORMAL HIGH (ref 0–99)
NONHDL: 140.5
TRIGLYCERIDES: 183 mg/dL — AB (ref 0.0–149.0)
VLDL: 36.6 mg/dL (ref 0.0–40.0)

## 2017-11-08 LAB — BASIC METABOLIC PANEL
BUN: 15 mg/dL (ref 6–23)
CALCIUM: 9 mg/dL (ref 8.4–10.5)
CO2: 30 meq/L (ref 19–32)
CREATININE: 0.9 mg/dL (ref 0.40–1.20)
Chloride: 99 mEq/L (ref 96–112)
GFR: 67.77 mL/min (ref >60.00)
GLUCOSE: 125 mg/dL — AB (ref 70–99)
Potassium: 3.5 mEq/L (ref 3.5–5.1)
Sodium: 137 mEq/L (ref 135–145)

## 2017-11-08 LAB — URINALYSIS, ROUTINE W REFLEX MICROSCOPIC
Bilirubin Urine: NEGATIVE
HGB URINE DIPSTICK: NEGATIVE
Ketones, ur: NEGATIVE
Nitrite: NEGATIVE
Specific Gravity, Urine: 1.02 (ref 1.000–1.030)
TOTAL PROTEIN, URINE-UPE24: NEGATIVE
Urine Glucose: NEGATIVE
Urobilinogen, UA: 1 (ref 0.0–1.0)
pH: 6 (ref 5.0–8.0)

## 2017-11-08 LAB — CBC WITH DIFFERENTIAL/PLATELET
BASOS ABS: 0.1 10*3/uL (ref 0.0–0.1)
Basophils Relative: 0.9 % (ref 0.0–3.0)
Eosinophils Absolute: 0.2 10*3/uL (ref 0.0–0.7)
Eosinophils Relative: 1.6 % (ref 0.0–5.0)
HCT: 40 % (ref 36.0–46.0)
HEMOGLOBIN: 13.2 g/dL (ref 12.0–15.0)
LYMPHS PCT: 38.3 % (ref 12.0–46.0)
Lymphs Abs: 4.9 10*3/uL — ABNORMAL HIGH (ref 0.7–4.0)
MCHC: 33.1 g/dL (ref 30.0–36.0)
MCV: 90.5 fl (ref 78.0–100.0)
MONOS PCT: 5.8 % (ref 3.0–12.0)
Monocytes Absolute: 0.7 10*3/uL (ref 0.1–1.0)
NEUTROS ABS: 6.8 10*3/uL (ref 1.4–7.7)
NEUTROS PCT: 53.4 % (ref 43.0–77.0)
Platelets: 264 10*3/uL (ref 150.0–400.0)
RBC: 4.42 Mil/uL (ref 3.87–5.11)
RDW: 14.7 % (ref 11.5–15.5)
WBC: 12.8 10*3/uL — AB (ref 4.0–10.5)

## 2017-11-08 LAB — HEPATIC FUNCTION PANEL
ALK PHOS: 61 U/L (ref 39–117)
ALT: 14 U/L (ref 0–35)
AST: 18 U/L (ref 0–37)
Albumin: 4 g/dL (ref 3.5–5.2)
Bilirubin, Direct: 0.2 mg/dL (ref 0.0–0.3)
Total Bilirubin: 0.9 mg/dL (ref 0.2–1.2)
Total Protein: 6.9 g/dL (ref 6.0–8.3)

## 2017-11-08 LAB — TSH: TSH: 3.35 u[IU]/mL (ref 0.35–4.50)

## 2017-11-08 MED ORDER — HYPROMELLOSE (GONIOSCOPIC) 2.5 % OP SOLN: 1.0000 [drp] | Freq: Three times a day (TID) | OPHTHALMIC | 3 refills | Status: DC | PRN

## 2017-11-08 MED ORDER — CYCLOBENZAPRINE HCL 10 MG PO TABS: 10.0000 mg | ORAL_TABLET | Freq: Every day | ORAL | 1 refills | Status: DC | PRN

## 2017-11-08 MED ORDER — GABAPENTIN 100 MG PO CAPS: 100.0000 mg | ORAL_CAPSULE | Freq: Three times a day (TID) | ORAL | 0 refills | Status: DC

## 2017-11-08 MED ORDER — PANTOPRAZOLE SODIUM 40 MG PO TBEC: 40.0000 mg | DELAYED_RELEASE_TABLET | Freq: Every day | ORAL | 3 refills | Status: DC

## 2017-11-08 MED ORDER — AZITHROMYCIN 250 MG PO TABS: ORAL_TABLET | ORAL | 1 refills | Status: DC

## 2017-11-08 MED ORDER — TRIAMCINOLONE ACETONIDE 0.1 % EX CREA: 1.0000 "application " | TOPICAL_CREAM | Freq: Two times a day (BID) | CUTANEOUS | 11 refills | Status: DC | PRN

## 2017-11-08 MED ORDER — CLONAZEPAM 1 MG PO TABS: 1.0000 mg | ORAL_TABLET | Freq: Two times a day (BID) | ORAL | 2 refills | Status: DC | PRN

## 2017-11-08 MED ORDER — OXYCODONE HCL 10 MG PO TABS: 10.0000 mg | ORAL_TABLET | Freq: Four times a day (QID) | ORAL | 0 refills | Status: DC

## 2017-11-08 MED ORDER — DICYCLOMINE HCL 10 MG PO CAPS: 10.0000 mg | ORAL_CAPSULE | Freq: Three times a day (TID) | ORAL | 2 refills | Status: DC

## 2017-11-08 MED ORDER — FUROSEMIDE 20 MG PO TABS: 20.0000 mg | ORAL_TABLET | Freq: Every day | ORAL | 3 refills | Status: DC | PRN

## 2017-11-08 MED ORDER — GABAPENTIN 300 MG PO CAPS: 300.0000 mg | ORAL_CAPSULE | Freq: Three times a day (TID) | ORAL | 3 refills | Status: DC

## 2017-11-08 NOTE — Assessment & Plan Note (Signed)
Mild to mod, for antibx course,  to f/u any worsening symptoms or concerns 

## 2017-11-08 NOTE — Assessment & Plan Note (Signed)
Mild uncontrolled, consider increased antidepressant pending pt letting us know her current med

## 2017-11-08 NOTE — Assessment & Plan Note (Signed)
stable overall by history and exam, recent data reviewed with pt, and pt to continue medical treatment as before,  to f/u any worsening symptoms or concerns BP Readings from Last 3 Encounters:  11/08/17 126/82  03/30/17 112/62  03/16/17 (!) 141/81

## 2017-11-08 NOTE — Assessment & Plan Note (Signed)
For f/u at [facility], continue prednisone

## 2017-11-08 NOTE — Progress Notes (Signed)
Subjective:    Patient ID: Maria [name], female    DOB: June 21, 1956, 61 y.o.   MRN: [id number]  HPI   Here to reestablish after being assigned on medicaid to a [facility] provider.  Here for wellness and other; overall doing ok;  Pt denies Chest pain, worsening SOB, DOE, wheezing, orthopnea, PND, worsening LE edema, palpitations, dizziness or syncope.  Pt denies neurological change such as new headache, facial or extremity weakness.  Pt denies polydipsia, polyuria, or low sugar symptoms. Pt states overall good compliance with treatment and medications, good tolerability, and has been trying to follow appropriate diet.  Pt denies worsening depressive symptoms, suicidal ideation or panic. No fever, night sweats, wt loss, loss of appetite, or other constitutional symptoms.  Pt states good ability with ADL's, has low fall risk, home safety reviewed and adequate, no other significant changes in hearing or vision, and only occasionally active with exercise.  Declines pap and mammogram  Walks with cane  Due for tetanus and flu shot Has unfortunatlely gained wt due to steroid use for sclerosing mesenteritis, has been referred to [facility], also taking azathioprine. On disability for lumbar spinal stenosis and chronic right knee pain just never got better.     C/o ST, feels raw, with cough in the AM with small amount of blood.  No fever, but just feeling this way since x 4 day.   Feels overall some better today incidentally, ears itch and believes she has a lump she can feel to the left mid neck that is tender.  Has been taking tylenol every 6 hrs so no fever.  Keeps the house colder at 65 to avoid getting the sinus heat up.  No sick contacts.   C/o increased depression due to less mobility, further wt gain on prednisone and essentially being confined to home.  Takes a 100 mg antidepressant but not sure of name. No SI or HI.  Also RLS med requip not working well even at 3 mg qhs prn Past Medical History:  Diagnosis Date    . Acute lymphadenitis 2011  . ALLERGIC RHINITIS 08/10/2007  . ANXIETY 12/06/2007  . Atherosclerotic peripheral vascular disease ([place]) 06/13/2013   Aorta on CT June 2014  . BACK PAIN 12/06/2007  . Cellulitis 2011  . Cervical disc disease 03/09/2012  . Chronic pain 03/09/2012  . DEPRESSION 12/06/2007  . DIVERTICULOSIS, [place] 12/06/2007  . GERD 12/06/2007  . Hepatitis    age 76 hepatitis A  . HYPERTENSION 12/06/2007  . Impaired glucose tolerance 03/13/2012  . LOW BACK PAIN 12/06/2007  . Lumbar disc disease 03/09/2012  . Mesenteric adenitis   . MRSA 2006  . Pain    by breast area  . Pain in joint, multiple sites 04/21/2010  . PARONYCHIA, FINGER 08/13/2009  . Pneumonia sept 2015   walking pneumonia, sepsis   . RASH-NONVESICULAR 05/09/2008  . 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[place]) 11/08/2017  . SHOULDER PAIN, LEFT 05/09/2008  . SINUSITIS- ACUTE-NOS 05/09/2008  . THORACIC/LUMBOSACRAL NEURITIS/RADICULITIS UNSPEC 12/28/2008   Past Surgical History:  Procedure Laterality Date  . ABDOMINAL HYSTERECTOMY  1999   1 ovary left  . bloo clot removed from neck   may 25th , june 2. 2010  . [place] [facility]ERY  may 24th 2010  . COLONOSCOPY WITH PROPOFOL N/A 07/10/2016   Performed by Manus Gunning, MD at [facility]  . ESOPHAGOGASTRODUODENOSCOPY (EGD) WITH PROPOFOL N/A 07/10/2016   Performed by Manus Gunning, MD at [facility]  . s/p ovary  cyst    . s/p right knee arthroscopy     Dr. Mardelle Matte ortho    reports that she quit smoking about 9 years ago. She quit after 30.00 years of use. she has never used smokeless tobacco. She reports that she does not drink alcohol or use drugs. family history includes Asthma in her daughter and sister; Cancer in her maternal uncle and other; Diabetes in her mother; Heart disease in her mother; Heart failure in her mother; Hypertension in her father and mother; Liver cancer in her paternal grandmother. Allergies  Allergen Reactions  . Adhesive [Tape] Other  (See Comments)    Tears skin off - use paper tape   . Amoxicillin-Pot Clavulanate Nausea And Vomiting  . Codeine Other (See Comments)    hallucinations  . Lactose Intolerance (Gi) Nausea And Vomiting  . Morphine And Related Nausea And Vomiting and Other (See Comments)    headaches  . Vicodin [Hydrocodone-Acetaminophen] Other (See Comments)    hallucinations  . Latex Rash   Current Outpatient Medications on File Prior to Visit  Medication Sig Dispense Refill  . [DISCONTINUED] DULoxetine (CYMBALTA) 60 MG capsule Take 60 mg by mouth daily.      . [DISCONTINUED] fexofenadine (ALLEGRA) 180 MG tablet Take 180 mg by mouth daily.       No current facility-administered medications on file prior to visit.    Review of Systems Constitutional: Negative for other unusual diaphoresis, sweats, appetite or weight changes HENT: Negative for other worsening hearing loss, ear pain, facial swelling, mouth sores or neck stiffness.   Eyes: Negative for other worsening pain, redness or other visual disturbance.  Respiratory: Negative for other stridor or swelling Cardiovascular: Negative for other palpitations or other chest pain  Gastrointestinal: Negative for worsening diarrhea or loose stools, blood in stool, distention or other pain Genitourinary: Negative for hematuria, flank pain or other change in urine volume.  Musculoskeletal: Negative for myalgias or other joint swelling.  Skin: Negative for other color change, or other wound or worsening drainage.  Neurological: Negative for other syncope or numbness. Hematological: Negative for other adenopathy or swelling Psychiatric/Behavioral: Negative for hallucinations, other worsening agitation, SI, self-injury, or new decreased concentration All other system neg per pt    Objective:   Physical Exam BP 126/82   Pulse 91   Temp 97.9 F (36.6 C) (Oral)   Ht 5' 4.5" (1.638 m)   Wt (!) 357 lb (161.9 kg)   SpO2 98%   BMI 60.33 kg/m  VS noted, mild  ill Constitutional: Pt is oriented to person, place, and time. Appears well-developed and well-nourished, in no significant distress and comfortable Head: Normocephalic and atraumatic  Eyes: Conjunctivae and EOM are normal. Pupils are equal, round, and reactive to light Right Ear: External ear normal without discharge Left Ear: External ear normal without discharge Nose: Nose without discharge or deformity Bilat tm's with mild erythema.  Max sinus areas non tender.  Pharynx with mild erythema, no exudate  Mouth/Throat: Oropharynx is without other ulcerations and moist  Neck: Normal range of motion. Neck supple. No JVD present. No tracheal deviation present or significant neck LA or mass Cardiovascular: Normal rate, regular rhythm, normal heart sounds and intact distal pulses.   Pulmonary/Chest: WOB normal and breath sounds without rales or wheezing  Abdominal: Soft. Bowel sounds are normal. NT. No HSM  Musculoskeletal: Normal range of motion. Exhibits no edema Lymphadenopathy: Has no other cervical adenopathy.  Neurological: Pt is alert and oriented to person, place, and  time. Pt has normal reflexes. No cranial nerve deficit. Motor grossly intact, Gait intact Skin: Skin is warm and dry. No rash noted or new ulcerations Psychiatric:  Has dysphoric mood and affect. Behavior is normal without agitation No other exam findings    Assessment & Plan:

## 2017-11-08 NOTE — Assessment & Plan Note (Signed)

## 2017-11-08 NOTE — Patient Instructions (Addendum)
Ok to stop the requip  Please take all new medication as prescribed - the gabapentin at 100 mg three times per day for 1 wk, then go to 300 mg three times per day after that.   Please let us know the name and dose of the Antidepressant  You had the flu shot today, and the tetanus  Please take all new medication as prescribed - the zpack  Please continue all other medications as before, and refills have been done if requested.  Please have the pharmacy call with any other refills you may need.  Please continue your efforts at being more active, low cholesterol diet, and weight control.  You are otherwise up to date with prevention measures today.  Please keep your appointments with your specialists as you may have planned  Please go to the LAB in the Basement (turn left off the elevator) for the tests to be done today  You will be contacted by phone if any changes need to be made immediately.  Otherwise, you will receive a letter about your results with an explanation, but please check with MyChart first.  Please remember to sign up for MyChart if you have not done so, as this will be important to you in the future with finding out test results, communicating by private email, and scheduling acute appointments online when needed.  Please return in 6 months, or sooner if needed

## 2017-11-08 NOTE — Assessment & Plan Note (Addendum)
Mild to mod, for antibx course,  to f/u any worsening symptoms or concerns  In addition to the time spent performing CPE, I spent an additional 30 minutes face to face,in which greater than 50% of this time was spent in counseling and coordination of care for patient's acute illness as documented, including the differential dx, tx, further evaluation and other management of acute URI, RLS, depression, HTN and sclerosing mesenteritis

## 2017-11-08 NOTE — Assessment & Plan Note (Signed)
Ok for change requip to gabapentin asd

## 2017-11-09 ENCOUNTER — Other Ambulatory Visit (INDEPENDENT_AMBULATORY_CARE_PROVIDER_SITE_OTHER): Payer: Medicare Other

## 2017-11-09 DIAGNOSIS — D729 Disorder of white blood cells, unspecified: Secondary | ICD-10-CM | POA: Diagnosis not present

## 2017-11-09 LAB — HEMOGLOBIN A1C: Hgb A1c MFr Bld: 6 % (ref 4.6–6.5)

## 2017-11-10 ENCOUNTER — Telehealth: Payer: Self-pay | Admitting: Internal Medicine

## 2017-11-10 NOTE — Telephone Encounter (Signed)
Pharmacy and are not able to get hydroxypropyl methylcellulose / hypromellose (ISOPTO TEARS / GONIOVISC) 2.5 % ophthalmic solution  From the pharmacy, please advise on alternative

## 2017-11-15 ENCOUNTER — Encounter: Payer: Self-pay | Admitting: Internal Medicine

## 2017-11-15 ENCOUNTER — Telehealth: Payer: Self-pay | Admitting: Internal Medicine

## 2017-11-15 NOTE — Telephone Encounter (Signed)
Unfortunately this was a prescription done out of courtesy  Please ask pt to seek any change of tx to her ophthalmologist, Keturah Barre

## 2017-11-15 NOTE — Telephone Encounter (Signed)
Dr. [name] Reichmann please advise. No notations entered.

## 2017-11-15 NOTE — Telephone Encounter (Signed)
Pt notified of response

## 2017-11-15 NOTE — Telephone Encounter (Signed)
Pt called for her lab results from 11/19 Please call back in regard

## 2017-11-15 NOTE — Telephone Encounter (Signed)
All labs were in acceptable range, and no acute problems or need for change in tx

## 2017-11-16 DIAGNOSIS — K654 Sclerosing mesenteritis: Secondary | ICD-10-CM | POA: Diagnosis not present

## 2017-11-16 NOTE — Telephone Encounter (Signed)
Pt has been informed and expressed understanding.  

## 2017-11-22 ENCOUNTER — Telehealth: Payer: Self-pay | Admitting: Internal Medicine

## 2017-11-22 DIAGNOSIS — R070 Pain in throat: Secondary | ICD-10-CM

## 2017-11-22 NOTE — Telephone Encounter (Signed)
Copied from [place]. Topic: Referral - Request >> Nov 22, 2017 11:19 AM Corie Chiquito, NT wrote: Reason for [id number] called because she would like to know if Dr.[name] could give her a referral to see someone about her throat because it is still giving her problems. Patient was seen 11-08-2017. If someone could please give her a call back about this matter.

## 2017-11-22 NOTE — Telephone Encounter (Signed)
[place] for [employer] referral

## 2017-11-22 NOTE — Telephone Encounter (Signed)
Pt has been informed and expressed understanding.  

## 2017-11-26 DIAGNOSIS — Z79891 Long term (current) use of opiate analgesic: Secondary | ICD-10-CM | POA: Diagnosis not present

## 2017-11-26 DIAGNOSIS — G894 Chronic pain syndrome: Secondary | ICD-10-CM | POA: Diagnosis not present

## 2017-11-26 DIAGNOSIS — G8929 Other chronic pain: Secondary | ICD-10-CM | POA: Diagnosis not present

## 2017-11-30 ENCOUNTER — Other Ambulatory Visit: Payer: Self-pay | Admitting: Internal Medicine

## 2017-11-30 DIAGNOSIS — K654 Sclerosing mesenteritis: Secondary | ICD-10-CM

## 2017-12-06 ENCOUNTER — Ambulatory Visit
Admission: RE | Admit: 2017-12-06 | Discharge: 2017-12-06 | Disposition: A | Discharge status: Home / Self Care | Payer: Medicare Other | Source: Ambulatory Visit | Attending: Internal Medicine | Admitting: Internal Medicine

## 2017-12-06 DIAGNOSIS — K760 Fatty (change of) liver, not elsewhere classified: Secondary | ICD-10-CM | POA: Diagnosis not present

## 2017-12-06 DIAGNOSIS — K654 Sclerosing mesenteritis: Secondary | ICD-10-CM

## 2017-12-06 MED ORDER — IOPAMIDOL (ISOVUE-300) INJECTION 61%
125.0000 mL | Freq: Once | INTRAVENOUS | Status: AC | PRN
  Administered 2017-12-06: 125 mL via INTRAVENOUS

## 2017-12-07 DIAGNOSIS — R490 Dysphonia: Secondary | ICD-10-CM | POA: Diagnosis not present

## 2017-12-07 DIAGNOSIS — K219 Gastro-esophageal reflux disease without esophagitis: Secondary | ICD-10-CM | POA: Diagnosis not present

## 2017-12-07 DIAGNOSIS — M542 Cervicalgia: Secondary | ICD-10-CM | POA: Diagnosis not present

## 2017-12-08 ENCOUNTER — Other Ambulatory Visit: Payer: Self-pay | Admitting: Otolaryngology

## 2017-12-08 DIAGNOSIS — M542 Cervicalgia: Secondary | ICD-10-CM

## 2017-12-17 ENCOUNTER — Other Ambulatory Visit: Payer: Self-pay | Admitting: Otolaryngology

## 2017-12-17 ENCOUNTER — Ambulatory Visit
Admission: RE | Admit: 2017-12-17 | Discharge: 2017-12-17 | Disposition: A | Discharge status: Home / Self Care | Payer: Medicare Other | Source: Ambulatory Visit | Attending: Otolaryngology | Admitting: Otolaryngology

## 2017-12-17 DIAGNOSIS — E041 Nontoxic single thyroid nodule: Secondary | ICD-10-CM | POA: Diagnosis not present

## 2017-12-17 DIAGNOSIS — M542 Cervicalgia: Secondary | ICD-10-CM

## 2017-12-22 ENCOUNTER — Other Ambulatory Visit: Payer: Self-pay | Admitting: Otolaryngology

## 2017-12-22 DIAGNOSIS — E041 Nontoxic single thyroid nodule: Secondary | ICD-10-CM

## 2017-12-28 NOTE — Progress Notes (Addendum)
Subjective:   Maria [name] is a 62 y.o. female who presents for an Initial Medicare Annual Wellness Visit.  Review of Systems    No ROS.  Medicare Wellness Visit. Additional risk factors are reflected in the social history.   Cardiac Risk Factors include: advanced age (>60men, >73 women);dyslipidemia;hypertension;obesity (BMI >30kg/m2);sedentary lifestyle Sleep patterns: gets up 1-2 times nightly to void and sleeps 6-7 hours nightly.    Home Safety/Smoke Alarms: Feels safe in home. Smoke alarms in place.  Living environment; residence and Adult nurse: apartment, equipment: Radio producer, Type: Narrow [id number] [place], [place], Type: Pick Up [employer], Type: Tub Surveyor, quantity, no firearms. Lives alone, no needs for DME, limited support system Seat Belt Safety/Bike Helmet: Wears seat belt.     Objective:    Today's Vitals   12/29/17 1434  BP: 136/64  Pulse: 76  Resp: 18  SpO2: 96%  Weight: (!) 360 lb (163.3 kg)  Height: 5\' 5"  (1.651 m)  PainSc: 5    Body mass index is 59.91 kg/m.  Advanced Directives 12/29/2017 03/16/2017 02/07/2017 09/07/2016 08/26/2016 07/10/2016 07/02/2016  Does Patient Have a Medical Advance Directive? No No No No No No No  Does patient want to make changes to medical advance directive? Yes (ED - Information included in AVS) - - - - - -  Would patient like information on creating a medical advance directive? - Yes (ED - Information included in AVS) - Yes - Educational materials given No - patient declined information No - patient declined information No - patient declined information  Pre-existing out of facility DNR order (yellow form or pink MOST form) - - - - - - -    Current Medications (verified) Outpatient Encounter Medications as of 12/29/2017  Medication Sig  . clonazePAM (KLONOPIN) 1 MG tablet Take 1 tablet (1 mg total) 2 (two) times daily as needed by mouth for anxiety.  . cyclobenzaprine (FLEXERIL) 10 MG tablet Take 1 tablet (10 mg total) daily  as needed by mouth for muscle spasms.  Marland Kitchen dicyclomine (BENTYL) 10 MG capsule Take 1 capsule (10 mg total) 4 (four) times daily -  before meals and at bedtime by mouth.  . furosemide (LASIX) 20 MG tablet Take 1 tablet (20 mg total) daily as needed by mouth for fluid.  Marland Kitchen gabapentin (NEURONTIN) 300 MG capsule Take 1 capsule (300 mg total) 3 (three) times daily by mouth.  . hydroxypropyl methylcellulose / hypromellose (ISOPTO TEARS / GONIOVISC) 2.5 % ophthalmic solution Place 1 drop 3 (three) times daily as needed into both eyes for dry eyes.  Marland Kitchen losartan (COZAAR) 50 MG tablet Take by mouth.  . Oxycodone HCl 10 MG TABS Take 1 tablet (10 mg total) 4 (four) times daily by mouth. Per [facility]  . pantoprazole (PROTONIX) 40 MG tablet Take 1 tablet (40 mg total) daily by mouth.  . sertraline (ZOLOFT) 100 MG tablet TAKE 1 TABLET BY MOUTH EVERY DAY  . triamcinolone cream (KENALOG) 0.1 % Apply 1 application 2 (two) times daily as needed topically (rash).  . [DISCONTINUED] azithromycin (ZITHROMAX Z-PAK) 250 MG tablet 2 by mouth day 1, then 1 per day (Patient not taking: Reported on 12/29/2017)  . [DISCONTINUED] gabapentin (NEURONTIN) 100 MG capsule Take 1 capsule (100 mg total) 3 (three) times daily by mouth. (Patient not taking: Reported on 12/29/2017)   No facility-administered encounter medications on file as of 12/29/2017.     Allergies (verified) Adhesive [tape]; Amoxicillin-pot clavulanate; Codeine; Lactose intolerance (gi); Morphine  and related; Vicodin [hydrocodone-acetaminophen]; and Latex   History: Past Medical History:  Diagnosis Date  . Acute lymphadenitis 2011  . ALLERGIC RHINITIS 08/10/2007  . ANXIETY 12/06/2007  . Atherosclerotic peripheral vascular disease ([place]) 06/13/2013   Aorta on CT June 2014  . BACK PAIN 12/06/2007  . Cellulitis 2011  . Cervical disc disease 03/09/2012  . Chronic pain 03/09/2012  . DEPRESSION 12/06/2007  . DIVERTICULOSIS, [place] 12/06/2007  . GERD 12/06/2007    . Hepatitis    age 15 hepatitis A  . HYPERTENSION 12/06/2007  . Impaired glucose tolerance 03/13/2012  . LOW BACK PAIN 12/06/2007  . Lumbar disc disease 03/09/2012  . Mesenteric adenitis   . MRSA 2006  . Pain    by breast area  . Pain in joint, multiple sites 04/21/2010  . PARONYCHIA, FINGER 08/13/2009  . Pneumonia sept 2015   walking pneumonia, sepsis   . RASH-NONVESICULAR 05/09/2008  . 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 SHOULDER PAIN, LEFT 05/09/2008  . SINUSITIS- ACUTE-NOS 05/09/2008  . THORACIC/LUMBOSACRAL NEURITIS/RADICULITIS UNSPEC 12/28/2008   Past Surgical History:  Procedure Laterality Date  . ABDOMINAL HYSTERECTOMY  1999   1 ovary left  . bloo clot removed from neck   may 25th , june 2. 2010  . [place] SURGERY  may 24th 2010  . COLONOSCOPY WITH PROPOFOL N/A 07/10/2016   Procedure: COLONOSCOPY WITH PROPOFOL;  Surgeon: Manus Gunning, MD;  Location: [facility];  Service: Gastroenterology;  Laterality: N/A;  . ESOPHAGOGASTRODUODENOSCOPY (EGD) WITH PROPOFOL N/A 07/10/2016   Procedure: ESOPHAGOGASTRODUODENOSCOPY (EGD) WITH PROPOFOL;  Surgeon: Manus Gunning, MD;  Location: [facility];  Service: Gastroenterology;  Laterality: N/A;  . s/p ovary cyst    . s/p right knee arthroscopy     Dr. Mardelle Matte ortho   Family History  Problem Relation Age of Onset  . Heart disease Mother   . Hypertension Mother   . Diabetes Mother   . Heart failure Mother   . Asthma Sister   . Hypertension Father   . Asthma Daughter   . Cancer Maternal Uncle        [place]  . Cancer Other        ovarian  . Liver cancer Paternal Grandmother        ????   Social History   Socioeconomic History  . Marital status: Divorced    Spouse name: None  . Number of children: 2  . Years of education: None  . Highest education level: None  Social Needs  . Financial resource strain: Very hard  . Food insecurity - worry: Often true  . Food insecurity - inability: Often true  .  Transportation needs - medical: Yes  . Transportation needs - non-medical: Yes  Occupational History  . Occupation: Chemical engineer: Forensic scientist  . 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 Smoking status: Former Smoker    Years: 30.00    Last attempt to quit: 11/08/2008    Years since quitting: 9.1  . Smokeless tobacco: Never Used  . Tobacco comment: quit 10/09  Substance and Sexual Activity  . Alcohol use: No  . Drug use: No  . Sexual activity: No    Birth control/protection: Surgical  Other Topics Concern  . None  Social History Narrative   Patient has difficulty financially affording food, medication, and affording essential bills. She states she has limited support from her daughter.     Tobacco Counseling Counseling given: Not Answered  Comment: quit 10/09  Activities of Daily Living In your present state of health, do you have any difficulty performing the following activities: 12/29/2017  Hearing? N  Vision? N  Difficulty concentrating or making decisions? N  Walking or climbing stairs? Y  Dressing or bathing? Y  Doing errands, shopping? Y  Preparing Food and eating ? Y  Using the Toilet? N  In the past six months, have you accidently leaked urine? Y  Do you have problems with loss of bowel control? N  Managing your Medications? N  Managing your Finances? N  Housekeeping or managing your Housekeeping? N  Some recent data might be hidden     Immunizations and Health Maintenance Immunization History  Administered Date(s) Administered  . Hepatitis B, ped/adol 06/03/2016, 07/17/2016  . Influenza Split 10/13/2012  . Influenza,inj,Quad PF,6+ Mos 08/31/2013, 11/08/2017  . Pneumococcal Polysaccharide-23 08/18/2013  . Td 12/21/2004  . Tdap 11/08/2017   There are no preventive care reminders to display for this patient.  Patient Care Team: Biagio Borg, MD as PCP - General (Internal Medicine) Armbruster, Carlota Raspberry, MD as  Consulting Physician (Gastroenterology) Helayne Seminole, MD as Consulting Physician (Otolaryngology) Artis Delay, MD as Referring Physician (Internal Medicine)  Indicate any recent Medical Services you may have received from other than [facility] providers in the past year (date may be approximate).     Assessment:   This is a routine wellness examination for [place]. Physical assessment deferred to PCP.   Hearing/Vision screen  Visual Acuity Screening   Right eye Left eye Both eyes  Without correction:     With correction:   20/25  Comments: Medicare does not cover and patient cannot afford to go to the eye doctor. Currently sees very well with correction eye glasses 20/25.   Hearing Screening Comments: Able to hear conversational tones w/o difficulty. No issues reported.  Passed whisper test  Dietary issues and exercise activities discussed: Current Exercise Habits: Home exercise routine, Time (Minutes): 30, Frequency (Times/Week): 4, Weekly Exercise (Minutes/Week): 120, Intensity: Mild, Exercise limited by: orthopedic condition(s)(s)  Diet (meal preparation, eat out, water intake, caffeinated beverages, dairy products, fruits and vegetables): in general, a "healthy" diet  , on average, 2 meals per day   Reviewed heart healthy diet, encouraged patient to increase daily water intake. Discussed weight loss strategies. Diet education was provided via handout.  Goals    . Patient Stated     Start to monitor diet to decrease carbohydrates, sugar and fat. Start to be more active by doing chair exercises and walking.       Depression Screen PHQ 2/9 Scores 12/29/2017 11/08/2017 11/08/2017 03/24/2016 03/11/2016 06/02/2013  PHQ - 2 Score 3 1 2  0 0 3  PHQ- 9 Score 10 - - - - 12    Fall Risk Fall Risk  12/29/2017 11/08/2017 06/02/2013  Falls in the past year? Yes Yes No  Number falls in past yr: 2 or more 2 or more -  Comment - falls off bed with nightmares -  Injury with Fall? No No -    Risk Factor Category  High Fall Risk - -  Risk for fall due to : Impaired mobility;Impaired balance/gait - -  Follow up Falls prevention discussed - -   Cognitive Function: MMSE - Mini Mental State Exam 12/29/2017  Orientation to time 5  Orientation to Place 5  Registration 3  Attention/ Calculation 5  Recall 2  Language- name 2 objects 2  Language- repeat 1  Language- follow 3 step command 3  Language- read & follow direction 1  Write a sentence 1  Copy design 1  Total score 29        Screening Tests Health Maintenance  Topic Date Due  . MAMMOGRAM  03/22/2019  . COLONOSCOPY  07/10/2026  . TETANUS/TDAP  11/09/2027  . INFLUENZA VACCINE  Completed  . Hepatitis C Screening  Completed  . HIV Screening  Completed  . PAP SMEAR  Discontinued     Plan:     [name] referral to assist with social needs and financial difficulties of not being able to afford food, medicine, and paying for essential bills.   Continue doing brain stimulating activities (puzzles, reading, adult coloring books, staying active) to keep memory sharp.   Continue to eat heart healthy diet (full of fruits, vegetables, whole grains, lean protein, water--limit salt, fat, and sugar intake) and increase physical activity as tolerated.  I have personally reviewed and noted the following in the patient's chart:   . Medical and social history . Use of alcohol, tobacco or illicit drugs  . Current medications and supplements . Functional ability and status . Nutritional status . Physical activity . Advanced directives . List of other physicians . Vitals . Screenings to include cognitive, depression, and falls . Referrals and appointments  In addition, I have reviewed and discussed with patient certain preventive protocols, quality metrics, and best practice recommendations. A written personalized care plan for preventive services as well as general preventive health recommendations were provided to patient.      Michiel Cowboy, RN   12/29/2017   Medical screening examination/treatment/procedure(s) were performed by non-physician practitioner and as supervising physician I was immediately available for consultation/collaboration. I agree with above. Cathlean Cower, MD

## 2017-12-29 ENCOUNTER — Ambulatory Visit (INDEPENDENT_AMBULATORY_CARE_PROVIDER_SITE_OTHER): Payer: Medicare Other | Admitting: *Deleted

## 2017-12-29 VITALS — BP 136/64 | HR 76 | Resp 18 | Ht 65.0 in | Wt 360.0 lb

## 2017-12-29 DIAGNOSIS — Z0000 Encounter for general adult medical examination without abnormal findings: Secondary | ICD-10-CM | POA: Diagnosis not present

## 2017-12-29 NOTE — Patient Instructions (Addendum)
Continue doing brain stimulating activities (puzzles, reading, adult coloring books, staying active) to keep memory sharp.   Continue to eat heart healthy diet (full of fruits, vegetables, whole grains, lean protein, water--limit salt, fat, and sugar intake) and increase physical activity as tolerated.  If you or someone you know has experienced the death of a loved one, the support of others can play an invaluable role in the healing process. [facility] offers grief and loss services for anyone in the community. Contact us today [phone]      Contact us Today [phone]     Maria [name] , Thank you for taking time to come for your Medicare Wellness Visit. I appreciate your ongoing commitment to your health goals. Please review the following plan we discussed and let me know if I can assist you in the future.   These are the goals we discussed: Goals    . Patient Stated     Start to monitor diet to decrease carbohydrates, sugar and fat. Start to be more active by doing chair exercises and walking.        This is a list of the screening recommended for you and due dates:  Health Maintenance  Topic Date Due  . Mammogram  03/22/2019  . [place] Cancer Screening  07/10/2026  . Tetanus Vaccine  11/09/2027  . Flu Shot  Completed  .  Hepatitis C: One time screening is recommended by Center for Disease Control  (CDC) for  adults born from [birth year] through 1964.   Completed  . HIV Screening  Completed  . Pap Smear  Discontinued

## 2017-12-30 DIAGNOSIS — M25551 Pain in right hip: Secondary | ICD-10-CM | POA: Diagnosis not present

## 2017-12-30 DIAGNOSIS — G8929 Other chronic pain: Secondary | ICD-10-CM | POA: Diagnosis not present

## 2017-12-30 DIAGNOSIS — M5417 Radiculopathy, lumbosacral region: Secondary | ICD-10-CM | POA: Diagnosis not present

## 2017-12-30 DIAGNOSIS — M25561 Pain in right knee: Secondary | ICD-10-CM | POA: Diagnosis not present

## 2017-12-30 DIAGNOSIS — G603 Idiopathic progressive neuropathy: Secondary | ICD-10-CM | POA: Diagnosis not present

## 2017-12-30 DIAGNOSIS — G541 Lumbosacral plexus disorders: Secondary | ICD-10-CM | POA: Diagnosis not present

## 2017-12-31 ENCOUNTER — Other Ambulatory Visit (HOSPITAL_COMMUNITY)
Admission: RE | Admit: 2017-12-31 | Discharge: 2017-12-31 | Disposition: A | Discharge status: Home / Self Care | Payer: Medicare Other | Source: Ambulatory Visit | Attending: Student | Admitting: Student

## 2017-12-31 ENCOUNTER — Ambulatory Visit
Admission: RE | Admit: 2017-12-31 | Discharge: 2017-12-31 | Disposition: A | Discharge status: Home / Self Care | Payer: Medicare Other | Source: Ambulatory Visit | Attending: Otolaryngology | Admitting: Otolaryngology

## 2017-12-31 DIAGNOSIS — E041 Nontoxic single thyroid nodule: Secondary | ICD-10-CM | POA: Diagnosis not present

## 2018-01-07 ENCOUNTER — Ambulatory Visit: Payer: Medicare Other | Admitting: Internal Medicine

## 2018-01-10 ENCOUNTER — Encounter (HOSPITAL_COMMUNITY): Payer: Self-pay | Admitting: Emergency Medicine

## 2018-01-10 DIAGNOSIS — R2243 Localized swelling, mass and lump, lower limb, bilateral: Secondary | ICD-10-CM | POA: Diagnosis present

## 2018-01-10 DIAGNOSIS — I10 Essential (primary) hypertension: Secondary | ICD-10-CM | POA: Diagnosis not present

## 2018-01-10 DIAGNOSIS — Z79899 Other long term (current) drug therapy: Secondary | ICD-10-CM | POA: Insufficient documentation

## 2018-01-10 DIAGNOSIS — R030 Elevated blood-pressure reading, without diagnosis of hypertension: Secondary | ICD-10-CM | POA: Diagnosis not present

## 2018-01-10 DIAGNOSIS — Z87891 Personal history of nicotine dependence: Secondary | ICD-10-CM | POA: Insufficient documentation

## 2018-01-10 DIAGNOSIS — Z91040 Latex allergy status: Secondary | ICD-10-CM | POA: Diagnosis not present

## 2018-01-10 DIAGNOSIS — R600 Localized edema: Secondary | ICD-10-CM | POA: Diagnosis not present

## 2018-01-10 DIAGNOSIS — R609 Edema, unspecified: Secondary | ICD-10-CM | POA: Insufficient documentation

## 2018-01-10 LAB — BASIC METABOLIC PANEL
ANION GAP: 11 (ref 5–15)
BUN: 6 mg/dL (ref 6–20)
CALCIUM: 8.9 mg/dL (ref 8.9–10.3)
CO2: 29 mmol/L (ref 22–32)
CREATININE: 0.85 mg/dL (ref 0.44–1.00)
Chloride: 102 mmol/L (ref 101–111)
GFR calc Af Amer: >60 mL/min (ref >60)
GFR calc non Af Amer: >60 mL/min (ref >60)
GLUCOSE: 109 mg/dL — AB (ref 65–99)
Potassium: 4.1 mmol/L (ref 3.5–5.1)
Sodium: 142 mmol/L (ref 135–145)

## 2018-01-10 LAB — CBC WITH DIFFERENTIAL/PLATELET
BASOS ABS: 0 10*3/uL (ref 0.0–0.1)
Basophils Relative: 0 %
EOS PCT: 2 %
Eosinophils Absolute: 0.1 10*3/uL (ref 0.0–0.7)
HCT: 38.6 % (ref 36.0–46.0)
Hemoglobin: 12.6 g/dL (ref 12.0–15.0)
Lymphocytes Relative: 39 %
Lymphs Abs: 2.8 10*3/uL (ref 0.7–4.0)
MCH: 30.2 pg (ref 26.0–34.0)
MCHC: 32.6 g/dL (ref 30.0–36.0)
MCV: 92.6 fL (ref 78.0–100.0)
MONO ABS: 0.5 10*3/uL (ref 0.1–1.0)
MONOS PCT: 8 %
Neutro Abs: 3.6 10*3/uL (ref 1.7–7.7)
Neutrophils Relative %: 51 %
PLATELETS: 234 10*3/uL (ref 150–400)
RBC: 4.17 MIL/uL (ref 3.87–5.11)
RDW: 13.8 % (ref 11.5–15.5)
WBC: 7.1 10*3/uL (ref 4.0–10.5)

## 2018-01-10 NOTE — ED Triage Notes (Signed)
Pt reports edema to bilateral legs x5days and "identation in back." Denies hx CHF, reports anxiety. States taking home oxycodone with no relief.

## 2018-01-11 ENCOUNTER — Emergency Department (HOSPITAL_COMMUNITY): Payer: Medicare Other

## 2018-01-11 ENCOUNTER — Emergency Department (HOSPITAL_COMMUNITY)
Admission: EM | Admit: 2018-01-11 | Discharge: 2018-01-11 | Disposition: A | Discharge status: Home / Self Care | Payer: Medicare Other | Attending: Emergency Medicine | Admitting: Emergency Medicine

## 2018-01-11 DIAGNOSIS — R609 Edema, unspecified: Secondary | ICD-10-CM

## 2018-01-11 MED ORDER — CEPHALEXIN 500 MG PO CAPS: 500.0000 mg | ORAL_CAPSULE | Freq: Four times a day (QID) | ORAL | 0 refills | Status: DC

## 2018-01-11 MED ORDER — KETOROLAC TROMETHAMINE 30 MG/ML IJ SOLN
15.0000 mg | Freq: Once | INTRAMUSCULAR | Status: AC
  Administered 2018-01-11: 15 mg via INTRAVENOUS
  Filled 2018-01-11: qty 1

## 2018-01-11 MED ORDER — BUMETANIDE 1 MG PO TABS
1.0000 mg | ORAL_TABLET | Freq: Every day | ORAL | Status: DC
  Administered 2018-01-11: 1 mg via ORAL
  Filled 2018-01-11: qty 1

## 2018-01-11 MED ORDER — FUROSEMIDE 10 MG/ML IJ SOLN
60.0000 mg | Freq: Once | INTRAMUSCULAR | Status: AC
  Administered 2018-01-11: 60 mg via INTRAVENOUS
  Filled 2018-01-11: qty 6

## 2018-01-11 MED ORDER — HYDROMORPHONE HCL 1 MG/ML IJ SOLN
0.5000 mg | Freq: Once | INTRAMUSCULAR | Status: AC
  Administered 2018-01-11: 0.5 mg via INTRAVENOUS
  Filled 2018-01-11: qty 1

## 2018-01-11 NOTE — ED Notes (Signed)
Bladder scan result, per Yvone Neu EMT: 297 mL

## 2018-01-11 NOTE — ED Provider Notes (Signed)
[facility] EM[facility]ENCY DEPARTMENT Provider Note   CSN: [id number] Arrival date & time: 01/10/18  2236     History   Chief Complaint Chief Complaint  Patient presents with  . Leg Swelling    HPI Maria [name] is a 62 y.o. female.  Patient presents for evaluation of bilateral LE edema that started approximately 5 days ago. There is pain in both legs that causes difficulty with ambulation. No fall. She denies chest pain or SOB. No nausea or vomiting. She denies history of CHF. No fever. She takes Lasix 20 mg daily as needed when her legas are swollen and reports she has been taking that dose without relief of leg swelling. She does not feel she has been urinating more than when not taking Lasix.    The history is provided by the patient. No language interpreter was used.    Past Medical History:  Diagnosis Date  . Acute lymphadenitis 2011  . ALLERGIC RHINITIS 08/10/2007  . ANXIETY 12/06/2007  . Atherosclerotic peripheral vascular disease ([place]) 06/13/2013   Aorta on CT June 2014  . BACK PAIN 12/06/2007  . Cellulitis 2011  . Cervical disc disease 03/09/2012  . Chronic pain 03/09/2012  . DEPRESSION 12/06/2007  . DIVERTICULOSIS, [place] 12/06/2007  . GERD 12/06/2007  . Hepatitis    age 66 hepatitis A  . HYPERTENSION 12/06/2007  . Impaired glucose tolerance 03/13/2012  . LOW BACK PAIN 12/06/2007  . Lumbar disc disease 03/09/2012  . Mesenteric adenitis   . MRSA 2006  . Pain    by breast area  . Pain in joint, multiple sites 04/21/2010  . PARONYCHIA, FINGER 08/13/2009  . Pneumonia sept 2015   walking pneumonia, sepsis   . RASH-NONVESICULAR 05/09/2008  . 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 SHOULDER PAIN, LEFT 05/09/2008  . SINUSITIS- ACUTE-NOS 05/09/2008  . THORACIC/LUMBOSACRAL NEURITIS/RADICULITIS UNSPEC 12/28/2008    Patient Active Problem List   Diagnosis Date Noted  . 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 Preventative health care 11/08/2017  .  Acute upper respiratory infection 11/08/2017  . Abdominal pain, epigastric   . Dysphagia   . [place] cancer screening   . Adverse effect of angiotensin-converting enzyme inhibitor 12/05/2015  . Restless leg 10/08/2015  . Dermatitis, stasis 04/26/2015  . Fibromyalgia 01/31/2015  . Acid reflux 10/17/2014  . 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 Post-traumatic osteoarthritis of both knees 09/19/2014  . Spinal stenosis 09/19/2014  . Acute respiratory failure with hypoxia ([place]) 08/20/2014  . Sepsis ([place]) 08/19/2014  . CAP (community acquired pneumonia) 08/19/2014  . Cervical disc disease 03/09/2012  . Lumbar disc disease 03/09/2012  . Chronic pain 03/09/2012  . Obesity 06/17/2011  . THORACIC/LUMBOSACRAL NEURITIS/RADICULITIS UNSPEC 12/28/2008  . ANXIETY 12/06/2007  . Depression 12/06/2007  . Essential hypertension 12/06/2007  . LOW BACK PAIN 12/06/2007    Past Surgical History:  Procedure Laterality Date  . ABDOMINAL HYSTERECTOMY  1999   1 ovary left  . bloo clot removed from neck   may 25th , june 2. 2010  . [place] SURGERY  may 24th 2010  . COLONOSCOPY WITH PROPOFOL N/A 07/10/2016   Procedure: COLONOSCOPY WITH PROPOFOL;  Surgeon: Manus Gunning, MD;  Location: [facility];  Service: Gastroenterology;  Laterality: N/A;  . ESOPHAGOGASTRODUODENOSCOPY (EGD) WITH PROPOFOL N/A 07/10/2016   Procedure: ESOPHAGOGASTRODUODENOSCOPY (EGD) WITH PROPOFOL;  Surgeon: Manus Gunning, MD;  Location[facility];  Service: Gastroenterology;  Laterality: N/A;  . s/p ovary cyst    . s/p  right knee arthroscopy     Dr. Mardelle Matte ortho    OB History    No data available       Home Medications    Prior to Admission medications   Medication Sig Start Date End Date Taking? Authorizing Provider  clonazePAM (KLONOPIN) 1 MG tablet Take 1 tablet (1 mg total) 2 (two) times daily as needed by mouth for anxiety. 11/08/17  Yes Biagio Borg, MD  cyclobenzaprine (FLEXERIL) 10 MG  tablet Take 1 tablet (10 mg total) daily as needed by mouth for muscle spasms. 11/08/17  Yes Biagio Borg, MD  dicyclomine (BENTYL) 10 MG capsule Take 1 capsule (10 mg total) 4 (four) times daily -  before meals and at bedtime by mouth. Patient taking differently: Take 10 mg by mouth 3 (three) times daily as needed for spasms.  11/08/17  Yes Biagio Borg, MD  furosemide (LASIX) 20 MG tablet Take 1 tablet (20 mg total) daily as needed by mouth for fluid. 11/08/17  Yes Biagio Borg, MD  gabapentin (NEURONTIN) 300 MG capsule Take 1 capsule (300 mg total) 3 (three) times daily by mouth. 11/08/17  Yes Biagio Borg, MD  hydroxypropyl methylcellulose / hypromellose (ISOPTO TEARS / GONIOVISC) 2.5 % ophthalmic solution Place 1 drop 3 (three) times daily as needed into both eyes for dry eyes. 11/08/17  Yes Biagio Borg, MD  losartan (COZAAR) 50 MG tablet Take 50 mg by mouth daily.  09/29/17  Yes [provider]  Oxycodone HCl 10 MG TABS Take 1 tablet (10 mg total) 4 (four) times daily by mouth. Per [facility] 11/08/17  Yes Biagio Borg, MD  pantoprazole (PROTONIX) 40 MG tablet Take 1 tablet (40 mg total) daily by mouth. 11/08/17  Yes Biagio Borg, MD  sertraline (ZOLOFT) 100 MG tablet TAKE 1 TABLET BY MOUTH EVERY DAY 10/04/17  Yes [provider]  triamcinolone cream (KENALOG) 0.1 % Apply 1 application 2 (two) times daily as needed topically (rash). 11/08/17  Yes Biagio Borg, MD    Family History Family History  Problem Relation Age of Onset  . Heart disease Mother   . Hypertension Mother   . Diabetes Mother   . Heart failure Mother   . Asthma Sister   . Hypertension Father   . Asthma Daughter   . Cancer Maternal Uncle        [place]  . Cancer Other        ovarian  . Liver cancer Paternal Grandmother        ????    Social History Social History   Tobacco Use  . Smoking status: Former Smoker    Years: 30.00    Last attempt to quit: 11/08/2008    Years  since quitting: 9.1  . Smokeless tobacco: Never Used  . Tobacco comment: quit 10/09  Substance Use Topics  . Alcohol use: No  . Drug use: No     Allergies   Adhesive [tape]; Amoxicillin-pot clavulanate; Codeine; Lactose intolerance (gi); Morphine and related; Vicodin [hydrocodone-acetaminophen]; and Latex   Review of Systems Review of Systems  Constitutional: Negative for chills and fever.  Respiratory: Negative.  Negative for shortness of breath.   Cardiovascular: Positive for leg swelling. Negative for chest pain.  Gastrointestinal: Negative.  Negative for abdominal pain, nausea and vomiting.  Musculoskeletal: Negative.   Skin: Negative.   Neurological: Negative.      Physical Exam Updated Vital Signs BP (!) 114/52   Pulse  80   Temp 98.1 F (36.7 C) (Oral)   Resp 11   Ht 5\' 5"  (1.651 m)   Wt (!) 163.3 kg (360 lb)   SpO2 99%   BMI 59.91 kg/m   Physical Exam  Constitutional: She is oriented to person, place, and time. She appears well-developed and well-nourished.  HENT:  Head: Normocephalic.  Neck: Normal range of motion. Neck supple.  Cardiovascular: Normal rate and regular rhythm.  Pulmonary/Chest: Effort normal and breath sounds normal. No stridor. She has no wheezes. She has no rales. She exhibits no tenderness.  Abdominal: Soft. Bowel sounds are normal. There is no tenderness. There is no rebound and no guarding.  Musculoskeletal: Normal range of motion. She exhibits edema (Significant bilateral LE edema. ).  Neurological: She is alert and oriented to person, place, and time.  Skin: Skin is warm and dry. No rash noted. No erythema.  Psychiatric: She has a normal mood and affect.     ED Treatments / Results  Labs (all labs ordered are listed, but only abnormal results are displayed) Labs Reviewed  BASIC METABOLIC PANEL - Abnormal; Notable for the following components:      Result Value   Glucose, Bld 109 (*)    All other components within normal limits   CBC WITH DIFFERENTIAL/PLATELET    EKG  EKG Interpretation None       Radiology No results found.  Procedures Procedures (including critical care time)  Medications Ordered in ED Medications  furosemide (LASIX) injection 60 mg (60 mg Intravenous Given 01/11/18 0310)  ketorolac (TORADOL) 30 MG/ML injection 15 mg (15 mg Intravenous Given 01/11/18 0307)     Initial Impression / Assessment and Plan / ED Course  I have reviewed the triage vital signs and the nursing notes.  Pertinent labs & imaging results that were available during my care of the patient were reviewed by me and considered in my medical decision making (see chart for details).     Patient here with bilateral LE edema x 1 week. No SOB or CP. No fever. Lasix 20 mg daily without relief.   Renal function is found to be normal. IV lasix ordered. CXR pending. Will reassess.  CXR shows no edema. 60 mg Lasix given IV without urinary output. Oral Bumex then given. Still no urinary output. Bladder scan ordered but she does not feel the need to urinate.   Plan to discharge home as she is not in failure and has no renal compromise. She has an appointment with her doctor tomorrow (01/12/18). Return precautions discussed.  Bladder scan is not yet done. Patient care signed out to Adline Peals, PA-C, pending final reevaluation.   Final Clinical Impressions(s) / ED Diagnoses   Final diagnoses:  None   1. Peripheral edema  ED Discharge Orders    None       Charlann Lange, PA-C 01/11/18 9509    Rolland Porter, MD 01/11/18 [phone]

## 2018-01-11 NOTE — ED Provider Notes (Signed)
Care assumed from previous provider [place]. Please see their note for further details to include full history and physical. To summarize in short pt is a 62 year old female presents to the ED with lower extremity edema.  Patient is on Lasix 20 mg as needed for leg swelling worsening swelling today.  Workup reveals no signs of the pulmonary edema or CHF exacerbation.  Patient given IV Lasix in the ED.  Patient has been able to urinate.  Patient is satting at 97% on room air.. Case discussed, plan agreed upon.  At the time of care handoff patient to be discharged home and has primary care follow-up tomorrow.  She was instructed to increase her Lasix dose to 60 mg today and tomorrow until she can see her primary care doctor tomorrow.  Patient on discharge states that her legs are red and warm to touch and she is concerned that he is developing cellulitis with hx of same. She does not want to go home and start running a fever and have to come back to the Ed. I instructed patient that the likelihood of her having a cellulitis in bilateral lower extremities that acute onset is very low however told patient that I will give her a prescription for antibiotics that I would recommend her not feeling unless she starts running a fever or until she sees her primary care doctor tomorrow.  Patient is agreeable this plan.   Pt is hemodynamically stable, in NAD, & able to ambulate in the ED. Evaluation does not show pathology that would require ongoing emergent intervention or inpatient treatment. I explained the diagnosis to the patient. Pain has been managed & has no complaints prior to dc. Pt is comfortable with above plan and is stable for discharge at this time. All questions were answered prior to disposition. Strict return precautions for f/u to the ED were discussed. Encouraged follow up with PCP.        Doristine Devoid, PA-C 01/11/18 2353    Rolland Porter, MD 01/11/18 [phone]

## 2018-01-11 NOTE — Discharge Instructions (Signed)
Increase your Lasix to 60 mg today and tomorrow and keep the scheduled appointment with Dr. [name] Reichmann for tomorrow. Return to the emergency department with any new or worsening symptoms.

## 2018-01-11 NOTE — ED Notes (Signed)
ED Provider at bedside. 

## 2018-01-12 ENCOUNTER — Other Ambulatory Visit (INDEPENDENT_AMBULATORY_CARE_PROVIDER_SITE_OTHER): Payer: Medicare Other

## 2018-01-12 ENCOUNTER — Encounter: Payer: Self-pay | Admitting: Internal Medicine

## 2018-01-12 ENCOUNTER — Ambulatory Visit (INDEPENDENT_AMBULATORY_CARE_PROVIDER_SITE_OTHER): Payer: Medicare Other | Admitting: Internal Medicine

## 2018-01-12 VITALS — BP 128/82 | HR 97 | Temp 97.6°F | Ht 65.0 in | Wt 361.0 lb

## 2018-01-12 DIAGNOSIS — I5030 Unspecified diastolic (congestive) heart failure: Secondary | ICD-10-CM | POA: Diagnosis not present

## 2018-01-12 DIAGNOSIS — I10 Essential (primary) hypertension: Secondary | ICD-10-CM

## 2018-01-12 DIAGNOSIS — R609 Edema, unspecified: Secondary | ICD-10-CM

## 2018-01-12 DIAGNOSIS — F411 Generalized anxiety disorder: Secondary | ICD-10-CM | POA: Diagnosis not present

## 2018-01-12 LAB — BRAIN NATRIURETIC PEPTIDE: Pro B Natriuretic peptide (BNP): 42 pg/mL (ref 0.0–100.0)

## 2018-01-12 NOTE — Progress Notes (Signed)
Subjective:    Patient ID: Maria [name], female    DOB: October 22, 1956, 62 y.o.   MRN: [id number]  HPI  Here with bilat leg swelling and discomfort she attributes to knees but involves the legs below the knees, gradually worsening over several weeks, and assoc with wt gain;  Wt at home started 367 about 2 wks ago, did seem to lose some wt at home to 357 with taking the lasix every day, but not clear the lasix 20 mg daily was effective;  Was seen at ED with verigo and swelling, and pt has started the 60 mg recommended dose yesterday and today; does seem to work at that dose as she perceives increased diuretic effect, though wt not appreciably improved by EMR.  Pt denies chest pain, increased sob or doe (except when nervous), wheezing, orthopnea, PND, palpitations, or syncope. Wt Readings from Last 3 Encounters:  01/12/18 (!) 361 lb (163.7 kg)  01/10/18 (!) 360 lb (163.3 kg)  12/29/17 (!) 360 lb (163.3 kg)   Past Medical History:  Diagnosis Date  . Acute lymphadenitis 2011  . ALLERGIC RHINITIS 08/10/2007  . ANXIETY 12/06/2007  . Atherosclerotic peripheral vascular disease ([place]) 06/13/2013   Aorta on CT June 2014  . BACK PAIN 12/06/2007  . Cellulitis 2011  . Cervical disc disease 03/09/2012  . Chronic pain 03/09/2012  . DEPRESSION 12/06/2007  . DIVERTICULOSIS, [place] 12/06/2007  . GERD 12/06/2007  . Hepatitis    age 54 hepatitis A  . HYPERTENSION 12/06/2007  . Impaired glucose tolerance 03/13/2012  . LOW BACK PAIN 12/06/2007  . Lumbar disc disease 03/09/2012  . Mesenteric adenitis   . MRSA 2006  . Pain    by breast area  . Pain in joint, multiple sites 04/21/2010  . PARONYCHIA, FINGER 08/13/2009  . Pneumonia sept 2015   walking pneumonia, sepsis   . RASH-NONVESICULAR 05/09/2008  . 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 SHOULDER PAIN, LEFT 05/09/2008  . SINUSITIS- ACUTE-NOS 05/09/2008  . THORACIC/LUMBOSACRAL NEURITIS/RADICULITIS UNSPEC 12/28/2008   Past Surgical History:  Procedure  Laterality Date  . ABDOMINAL HYSTERECTOMY  1999   1 ovary left  . bloo clot removed from neck   may 25th , june 2. 2010  . [place] SURGERY  may 24th 2010  . COLONOSCOPY WITH PROPOFOL N/A 07/10/2016   Procedure: COLONOSCOPY WITH PROPOFOL;  Surgeon: Manus Gunning, MD;  Location: [facility];  Service: Gastroenterology;  Laterality: N/A;  . ESOPHAGOGASTRODUODENOSCOPY (EGD) WITH PROPOFOL N/A 07/10/2016   Procedure: ESOPHAGOGASTRODUODENOSCOPY (EGD) WITH PROPOFOL;  Surgeon: Manus Gunning, MD;  Location: [facility];  Service: Gastroenterology;  Laterality: N/A;  . s/p ovary cyst    . s/p right knee arthroscopy     Dr. Mardelle Matte ortho    reports that she quit smoking about 9 years ago. She quit after 30.00 years of use. she has never used smokeless tobacco. She reports that she does not drink alcohol or use drugs. family history includes Asthma in her daughter and sister; Cancer in her maternal uncle and other; Diabetes in her mother; Heart disease in her mother; Heart failure in her mother; Hypertension in her father and mother; Liver cancer in her paternal grandmother. Allergies  Allergen Reactions  . Adhesive [Tape] Other (See Comments)    Tears skin off - use paper tape   . Amoxicillin-Pot Clavulanate Nausea And Vomiting  . Codeine Other (See Comments)    hallucinations  . Lactose Intolerance (Gi) Nausea And Vomiting  . Morphine And  Related Nausea And Vomiting and Other (See Comments)    headaches  . Vicodin [Hydrocodone-Acetaminophen] Other (See Comments)    hallucinations  . Latex Rash   Current Outpatient Medications on File Prior to Visit  Medication Sig Dispense Refill  . clonazePAM (KLONOPIN) 1 MG tablet Take 1 tablet (1 mg total) 2 (two) times daily as needed by mouth for anxiety. 60 tablet 2  . cyclobenzaprine (FLEXERIL) 10 MG tablet Take 1 tablet (10 mg total) daily as needed by mouth for muscle spasms. 90 tablet 1  . dicyclomine (BENTYL) 10 MG capsule Take  1 capsule (10 mg total) 4 (four) times daily -  before meals and at bedtime by mouth. (Patient taking differently: Take 10 mg by mouth 3 (three) times daily as needed for spasms. ) 120 capsule 2  . furosemide (LASIX) 20 MG tablet Take 1 tablet (20 mg total) daily as needed by mouth for fluid. 90 tablet 3  . gabapentin (NEURONTIN) 300 MG capsule Take 1 capsule (300 mg total) 3 (three) times daily by mouth. 90 capsule 3  . losartan (COZAAR) 50 MG tablet Take 50 mg by mouth daily.     . Oxycodone HCl 10 MG TABS Take 1 tablet (10 mg total) 4 (four) times daily by mouth. Per [facility] 30 tablet 0  . pantoprazole (PROTONIX) 40 MG tablet Take 1 tablet (40 mg total) daily by mouth. 90 tablet 3  . sertraline (ZOLOFT) 100 MG tablet TAKE 1 TABLET BY MOUTH EVERY DAY    . triamcinolone cream (KENALOG) 0.1 % Apply 1 application 2 (two) times daily as needed topically (rash). 30 g 11  . [DISCONTINUED] DULoxetine (CYMBALTA) 60 MG capsule Take 60 mg by mouth daily.      . [DISCONTINUED] fexofenadine (ALLEGRA) 180 MG tablet Take 180 mg by mouth daily.       No current facility-administered medications on file prior to visit.    Review of Systems  Constitutional: Negative for other unusual diaphoresis or sweats HENT: Negative for ear discharge or swelling Eyes: Negative for other worsening visual disturbances Respiratory: Negative for stridor or other swelling  Gastrointestinal: Negative for worsening distension or other blood Genitourinary: Negative for retention or other urinary change Musculoskeletal: Negative for other MSK pain or swelling Skin: Negative for color change or other new lesions Neurological: Negative for worsening tremors and other numbness  Psychiatric/Behavioral: Negative for worsening agitation or other fatigue All other system neg per pt   Objective:   Physical Exam BP 128/82   Pulse 97   Temp 97.6 F (36.4 C) (Oral)   Ht 5\' 5"  (1.651 m)   Wt (!) 361 lb (163.7 kg)    SpO2 95%   BMI 60.07 kg/m  VS noted,  Constitutional: Pt appears in NAD HENT: Head: NCAT.  Right Ear: External ear normal.  Left Ear: External ear normal.  Eyes: . Pupils are equal, round, and reactive to light. Conjunctivae and EOM are normal Nose: without d/c or deformity Neck: Neck supple. Gross normal ROM Cardiovascular: Normal rate and regular rhythm.   Pulmonary/Chest: Effort normal and breath sounds without rales or wheezing.  Abd:  Soft, NT, ND, + BS, no organomegaly Neurological: Pt is alert. At baseline orientation, motor grossly intact Skin: Skin is warm. No rashes, other new lesions Psychiatric: Pt behavior is normal without agitation but tearful, nervous, frustrated 2-3+ bilat LE edema to knees left > right, tight, tense without ulcerations or weeping but diffuse tender, no erythema or drainage or  red streaks  ECG today I have personally interpreted NSR - no ischemic changes  Echo 07-15-2015 Study Conclusions - Left ventricle: The cavity size was normal. Wall thickness was   increased in a pattern of mild LVH. Systolic function was   vigorous. The estimated ejection fraction was in the range of 65%   to 70%. Doppler parameters are consistent with abnormal left   ventricular relaxation (grade 1 diastolic dysfunction). - Aortic valve: There was no stenosis. - Mitral valve: There was no significant regurgitation. - Right ventricle: The cavity size was normal. Systolic function   was normal. - Pulmonary arteries: No complete TR doppler jet so unable to   estimate PA systolic pressure. - Inferior vena cava: The vessel was normal in size. The   respirophasic diameter changes were in the normal range (= 50%),   consistent with normal central venous pressure.   Impressions:  - Normal LV size with mild LV hypertrophy. EF 60-65%. Normal RV   size and systolic function. No significant valvular   abnormalities.    Assessment & Plan:

## 2018-01-12 NOTE — Patient Instructions (Signed)
Ok to take a total of 3 days of lasix at 60 mg in the AM, then 40 mg per day after that  Please weight yourself daily, with the goal of losing 1/2 - 1 lb per day  Your EKG was OK  You will be contacted regarding the referral for: Echocardiogram  Please continue all other medications as before, and refills have been done if requested.  Please have the pharmacy call with any other refills you may need.  Please keep your appointments with your specialists as you may have planned  Please go to the LAB in the Basement (turn left off the elevator) for the tests to be done today  You will be contacted by phone if any changes need to be made immediately.  Otherwise, you will receive a letter about your results with an explanation, but please check with MyChart first.  Please remember to sign up for MyChart if you have not done so, as this will be important to you in the future with finding out test results, communicating by private email, and scheduling acute appointments online when needed.  Please return in 1 week, or sooner if needed

## 2018-01-13 ENCOUNTER — Encounter: Payer: Self-pay | Admitting: Internal Medicine

## 2018-01-13 LAB — BASIC METABOLIC PANEL
BUN: 6 mg/dL (ref 6–23)
CALCIUM: 9.2 mg/dL (ref 8.4–10.5)
CO2: 31 mEq/L (ref 19–32)
Chloride: 100 mEq/L (ref 96–112)
Creatinine, Ser: 0.84 mg/dL (ref 0.40–1.20)
GFR: 73.34 mL/min (ref >60.00)
GLUCOSE: 96 mg/dL (ref 70–99)
Potassium: 3.8 mEq/L (ref 3.5–5.1)
Sodium: 141 mEq/L (ref 135–145)

## 2018-01-14 ENCOUNTER — Ambulatory Visit: Payer: Self-pay

## 2018-01-14 NOTE — Telephone Encounter (Signed)
Patient called in with "leg swelling." She says "I saw the doctor on Wednesday and was told to increase my lasix to 60/day. I've been taking it and my legs seem worse. My left leg is a lot more swollen, my right leg seemed to have gone down some. I don't know what to do. I can barely walk on them, because it feels like the fluid is moving up."  I asked how swollen, she said "moderate, I can press and it's indented, it hurts when I press on them and when I don't press." I asked how bad is the pain and is the pain all the time, she said "yes, all the time and a 6." I asked are her legs red, she said "yes, but they are not hot like it was when I had cellulitis years ago. I was told in the emergency room on Monday that sometimes the swelling can cause the skin to look red, but it's no different than it was in the office." She denies chest pain, fever. According to protocol, see PCP within 24 hours, appointment made tomorrow at 12pm with Maria Peng, NP, care advice given, she says "I will see if I can get someone to bring me." I advised her to call back before 5 pm today if she didn't have a ride to cancel her appointment, but I encouraged her to come to the appointment.  Reason for Disposition . [1] MODERATE leg swelling (e.g., swelling extends up to knees) AND [2] new onset or worsening  Answer Assessment - Initial Assessment Questions 1. ONSET: "When did the swelling start?" (e.g., minutes, hours, days)     2 weeks ago; got bad on Monday 2. LOCATION: "What part of the leg is swollen?"  "Are both legs swollen or just one leg?"     Knee down to feet, both legs, left worse than right 3. SEVERITY: "How bad is the swelling?" (e.g., localized; mild, moderate, severe)  - Localized - small area of swelling localized to one leg  - MILD pedal edema - swelling limited to foot and ankle, pitting edema < 1/4 inch (6 mm) deep, rest and elevation eliminate most or all swelling  - MODERATE edema - swelling of lower  leg to knee, pitting edema > 1/4 inch (6 mm) deep, rest and elevation only partially reduce swelling  - SEVERE edema - swelling extends above knee, facial or hand swelling present      Moderate 4. REDNESS: "Does the swelling look red or infected?"     Redness 5. PAIN: "Is the swelling painful to touch?" If so, ask: "How painful is it?"   (Scale 1-10; mild, moderate or severe)     Yes-6 6. FEVER: "Do you have a fever?" If so, ask: "What is it, how was it measured, and when did it start?"      No 7. CAUSE: "What do you think is causing the leg swelling?"     Unsure 8. MEDICAL HISTORY: "Do you have a history of heart failure, kidney disease, liver failure, or cancer?"     No 9. RECURRENT SYMPTOM: "Have you had leg swelling before?" If so, ask: "When was the last time?" "What happened that time?"     Less than 1 year ago 10. OTHER SYMPTOMS: "Do you have any other symptoms?" (e.g., chest pain, difficulty breathing)       Denies 11. PREGNANCY: "Is there any chance you are pregnant?" "When was your last menstrual period?"  No  Protocols used: LEG SWELLING AND EDEMA-A-AH

## 2018-01-15 ENCOUNTER — Emergency Department (HOSPITAL_COMMUNITY): Payer: Medicare Other

## 2018-01-15 ENCOUNTER — Ambulatory Visit: Payer: Medicare Other | Admitting: Adult Health

## 2018-01-15 ENCOUNTER — Other Ambulatory Visit: Payer: Self-pay

## 2018-01-15 ENCOUNTER — Encounter (HOSPITAL_COMMUNITY): Payer: Self-pay

## 2018-01-15 ENCOUNTER — Ambulatory Visit (INDEPENDENT_AMBULATORY_CARE_PROVIDER_SITE_OTHER): Payer: Medicare Other | Admitting: Family Medicine

## 2018-01-15 ENCOUNTER — Encounter: Payer: Self-pay | Admitting: Family Medicine

## 2018-01-15 ENCOUNTER — Emergency Department (HOSPITAL_COMMUNITY)
Admission: EM | Admit: 2018-01-15 | Discharge: 2018-01-15 | Disposition: A | Discharge status: Home / Self Care | Payer: Medicare Other | Attending: Emergency Medicine | Admitting: Emergency Medicine

## 2018-01-15 DIAGNOSIS — R0602 Shortness of breath: Secondary | ICD-10-CM | POA: Diagnosis not present

## 2018-01-15 DIAGNOSIS — Z87891 Personal history of nicotine dependence: Secondary | ICD-10-CM | POA: Insufficient documentation

## 2018-01-15 DIAGNOSIS — Z79899 Other long term (current) drug therapy: Secondary | ICD-10-CM | POA: Diagnosis not present

## 2018-01-15 DIAGNOSIS — R109 Unspecified abdominal pain: Secondary | ICD-10-CM | POA: Diagnosis not present

## 2018-01-15 DIAGNOSIS — R609 Edema, unspecified: Secondary | ICD-10-CM | POA: Diagnosis not present

## 2018-01-15 DIAGNOSIS — Z91040 Latex allergy status: Secondary | ICD-10-CM | POA: Insufficient documentation

## 2018-01-15 DIAGNOSIS — R531 Weakness: Secondary | ICD-10-CM | POA: Diagnosis not present

## 2018-01-15 DIAGNOSIS — I10 Essential (primary) hypertension: Secondary | ICD-10-CM | POA: Insufficient documentation

## 2018-01-15 DIAGNOSIS — R069 Unspecified abnormalities of breathing: Secondary | ICD-10-CM | POA: Diagnosis not present

## 2018-01-15 DIAGNOSIS — R600 Localized edema: Secondary | ICD-10-CM | POA: Diagnosis not present

## 2018-01-15 LAB — BASIC METABOLIC PANEL
ANION GAP: 8 (ref 5–15)
BUN: 9 mg/dL (ref 6–20)
CALCIUM: 8.7 mg/dL — AB (ref 8.9–10.3)
CO2: 31 mmol/L (ref 22–32)
CREATININE: 0.86 mg/dL (ref 0.44–1.00)
Chloride: 100 mmol/L — ABNORMAL LOW (ref 101–111)
GFR calc Af Amer: >60 mL/min (ref >60)
GLUCOSE: 109 mg/dL — AB (ref 65–99)
Potassium: 3 mmol/L — ABNORMAL LOW (ref 3.5–5.1)
Sodium: 139 mmol/L (ref 135–145)

## 2018-01-15 LAB — CBC
HCT: 35.2 % — ABNORMAL LOW (ref 36.0–46.0)
Hemoglobin: 11.5 g/dL — ABNORMAL LOW (ref 12.0–15.0)
MCH: 29.8 pg (ref 26.0–34.0)
MCHC: 32.7 g/dL (ref 30.0–36.0)
MCV: 91.2 fL (ref 78.0–100.0)
Platelets: 219 10*3/uL (ref 150–400)
RBC: 3.86 MIL/uL — ABNORMAL LOW (ref 3.87–5.11)
RDW: 13.9 % (ref 11.5–15.5)
WBC: 5.7 10*3/uL (ref 4.0–10.5)

## 2018-01-15 LAB — I-STAT BETA HCG BLOOD, ED (MC, WL, AP ONLY)

## 2018-01-15 LAB — PROTIME-INR
INR: 1.11
Prothrombin Time: 14.3 seconds (ref 11.4–15.2)

## 2018-01-15 LAB — TROPONIN I: Troponin I: <0.03 ng/mL (ref <0.03)

## 2018-01-15 MED ORDER — FUROSEMIDE 10 MG/ML IJ SOLN
40.0000 mg | Freq: Once | INTRAMUSCULAR | Status: AC
  Administered 2018-01-15: 40 mg via INTRAVENOUS
  Filled 2018-01-15: qty 4

## 2018-01-15 MED ORDER — OXYCODONE-ACETAMINOPHEN 5-325 MG PO TABS
2.0000 | ORAL_TABLET | Freq: Once | ORAL | Status: AC
  Administered 2018-01-15: 2 via ORAL
  Filled 2018-01-15: qty 2

## 2018-01-15 NOTE — Assessment & Plan Note (Signed)
More SOB in spite of inc lasix, concern for CHF exacerbation.  D/w pt.  Needs ER eval. 911 called. Transport pending. D/w pt.  She agrees with plan.

## 2018-01-15 NOTE — Progress Notes (Signed)
Leg swelling. Weight is up since last OV.  She was recently seen with the plan to take a total of 3 days of lasix at 60 mg in the AM, then 40 mg per day after that.  She has echo pending.   No CP. She has been more SOB in the last few days.  The swelling is getting worse, she can can't get her shoes on today and she prev could.  Her legs are more tender and painful from the swelling, in spite of higher dose of lasix. She got SOB walking into the clinic today.  She lives alone.   She has prev diastolic dysfunction on echo 2016.    Meds, vitals, and allergies reviewed.   ROS: Per HPI unless specifically indicated in ROS section   GEN: nad, alert and oriented HEENT: mucous membranes moist NECK: supple w/o LA CV: rrr.  PULM: dec BS at the B bases.  ABD: soft, +bs EXT: 3+ BLE edema SKIN: chronic brawny changes on the BLE

## 2018-01-15 NOTE — ED Provider Notes (Signed)
[facility] DEPT Provider Note   CSN: [id number] Arrival date & time: 01/15/18  1111     History   Chief Complaint Chief Complaint  Patient presents with  . Leg Swelling    Hands, Legs, Feet    HPI Maria [name] is a 62 y.o. female.  Patient complains of swelling to her legs.  She had her Lasix increased from 20 mg to 60 mg a day starting on Monday.  She saw her family doctor Wednesday and then also went to her family doctor office today who sent her over here.  She states the swelling is getting worse   The history is provided by the patient.  Weakness  Primary symptoms include no focal weakness. This is a recurrent problem. The current episode started more than 1 week ago. The problem has not changed since onset.There was no focality noted. There has been no fever. Pertinent negatives include no shortness of breath, no chest pain and no headaches.    Past Medical History:  Diagnosis Date  . Acute lymphadenitis 2011  . ALLERGIC RHINITIS 08/10/2007  . ANXIETY 12/06/2007  . Atherosclerotic peripheral vascular disease ([place]) 06/13/2013   Aorta on CT June 2014  . BACK PAIN 12/06/2007  . Cellulitis 2011  . Cervical disc disease 03/09/2012  . Chronic pain 03/09/2012  . DEPRESSION 12/06/2007  . DIVERTICULOSIS, [place] 12/06/2007  . GERD 12/06/2007  . Hepatitis    age 25 hepatitis A  . HYPERTENSION 12/06/2007  . Impaired glucose tolerance 03/13/2012  . LOW BACK PAIN 12/06/2007  . Lumbar disc disease 03/09/2012  . Mesenteric adenitis   . MRSA 2006  . Pain    by breast area  . Pain in joint, multiple sites 04/21/2010  . PARONYCHIA, FINGER 08/13/2009  . Pneumonia sept 2015   walking pneumonia, sepsis   . RASH-NONVESICULAR 05/09/2008  . 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 SHOULDER PAIN, LEFT 05/09/2008  . SINUSITIS- ACUTE-NOS 05/09/2008  . THORACIC/LUMBOSACRAL NEURITIS/RADICULITIS UNSPEC 12/28/2008    Patient Active Problem List   Diagnosis  Date Noted  . SOB (shortness of breath) 01/15/2018  . 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 Preventative health care 11/08/2017  . Acute upper respiratory infection 11/08/2017  . Abdominal pain, epigastric   . Dysphagia   . [place] cancer screening   . Adverse effect of angiotensin-converting enzyme inhibitor 12/05/2015  . Restless leg 10/08/2015  . Dermatitis, stasis 04/26/2015  . Fibromyalgia 01/31/2015  . Acid reflux 10/17/2014  . 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 Post-traumatic osteoarthritis of both knees 09/19/2014  . Spinal stenosis 09/19/2014  . Acute respiratory failure with hypoxia ([place]) 08/20/2014  . Sepsis ([place]) 08/19/2014  . CAP (community acquired pneumonia) 08/19/2014  . Cervical disc disease 03/09/2012  . Lumbar disc disease 03/09/2012  . Chronic pain 03/09/2012  . Obesity 06/17/2011  . THORACIC/LUMBOSACRAL NEURITIS/RADICULITIS UNSPEC 12/28/2008  . ANXIETY 12/06/2007  . Depression 12/06/2007  . Essential hypertension 12/06/2007  . LOW BACK PAIN 12/06/2007    Past Surgical History:  Procedure Laterality Date  . ABDOMINAL HYSTERECTOMY  1999   1 ovary left  . bloo clot removed from neck   may 25th , june 2. 2010  . [place] SURGERY  may 24th 2010  . COLONOSCOPY WITH PROPOFOL N/A 07/10/2016   Procedure: COLONOSCOPY WITH PROPOFOL;  Surgeon: Manus Gunning, MD;  Location: [facility];  Service: Gastroenterology;  Laterality: N/A;  . ESOPHAGOGASTRODUODENOSCOPY (EGD) WITH PROPOFOL N/A 07/10/2016   Procedure: ESOPHAGOGASTRODUODENOSCOPY (EGD)  WITH PROPOFOL;  Surgeon: Manus Gunning, MD;  Location: [facility];  Service: Gastroenterology;  Laterality: N/A;  . s/p ovary cyst    . s/p right knee arthroscopy     Dr. Mardelle Matte ortho    OB History    No data available       Home Medications    Prior to Admission medications   Medication Sig Start Date End Date Taking? Authorizing Provider  clonazePAM (KLONOPIN) 1 MG tablet Take  1 tablet (1 mg total) 2 (two) times daily as needed by mouth for anxiety. 11/08/17   Biagio Borg, MD  cyclobenzaprine (FLEXERIL) 10 MG tablet Take 1 tablet (10 mg total) daily as needed by mouth for muscle spasms. 11/08/17   Biagio Borg, MD  dicyclomine (BENTYL) 10 MG capsule Take 1 capsule (10 mg total) 4 (four) times daily -  before meals and at bedtime by mouth. Patient taking differently: Take 10 mg by mouth 3 (three) times daily as needed for spasms.  11/08/17   Biagio Borg, MD  furosemide (LASIX) 20 MG tablet Take 1 tablet (20 mg total) daily as needed by mouth for fluid. 11/08/17   Biagio Borg, MD  gabapentin (NEURONTIN) 300 MG capsule Take 1 capsule (300 mg total) 3 (three) times daily by mouth. 11/08/17   Biagio Borg, MD  losartan (COZAAR) 50 MG tablet Take 50 mg by mouth daily.  09/29/17   [provider]  Oxycodone HCl 10 MG TABS Take 1 tablet (10 mg total) 4 (four) times daily by mouth. Per [facility] 11/08/17   Biagio Borg, MD  pantoprazole (PROTONIX) 40 MG tablet Take 1 tablet (40 mg total) daily by mouth. 11/08/17   Biagio Borg, MD  sertraline (ZOLOFT) 100 MG tablet TAKE 1 TABLET BY MOUTH EVERY DAY 10/04/17   [provider]  triamcinolone cream (KENALOG) 0.1 % Apply 1 application 2 (two) times daily as needed topically (rash). 11/08/17   Biagio Borg, MD  DULoxetine (CYMBALTA) 60 MG capsule Take 60 mg by mouth daily.    03/09/12  [provider]  fexofenadine (ALLEGRA) 180 MG tablet Take 180 mg by mouth daily.    03/09/12  [provider]    Family History Family History  Problem Relation Age of Onset  . Heart disease Mother   . Hypertension Mother   . Diabetes Mother   . Heart failure Mother   . Asthma Sister   . Hypertension Father   . Asthma Daughter   . Cancer Maternal Uncle        Basilio  . Cancer Other        ovarian  . Liver cancer Paternal Grandmother        ????    Social History Social History    Tobacco Use  . Smoking status: Former Smoker    Years: 30.00    Last attempt to quit: 11/08/2008    Years since quitting: 9.1  . Smokeless tobacco: Never Used  . Tobacco comment: quit 10/09  Substance Use Topics  . Alcohol use: No  . Drug use: No     Allergies   Adhesive [tape]; Amoxicillin-pot clavulanate; Codeine; Lactose intolerance (gi); Morphine and related; Vicodin [hydrocodone-acetaminophen]; and Latex   Review of Systems Review of Systems  Constitutional: Negative for appetite change and fatigue.  HENT: Negative for congestion, ear discharge and sinus pressure.   Eyes: Negative for discharge.  Respiratory: Negative for cough and shortness of  breath.   Cardiovascular: Negative for chest pain.  Gastrointestinal: Negative for abdominal pain and diarrhea.  Genitourinary: Negative for frequency and hematuria.  Musculoskeletal: Negative for back pain.       Swelling in legs  Skin: Negative for rash.  Neurological: Positive for weakness. Negative for focal weakness, seizures and headaches.  Psychiatric/Behavioral: Negative for hallucinations.     Physical Exam Updated Vital Signs BP (!) 120/52   Pulse 89   Temp 98.2 F (36.8 C) (Oral)   Resp 16   Ht 5\' 5"  (1.651 m)   Wt (!) 163.3 kg (360 lb)   SpO2 (S) 98%   BMI 59.91 kg/m   Physical Exam  Constitutional: She is oriented to person, place, and time. She appears well-developed.  Obese  HENT:  Head: Normocephalic.  Eyes: Conjunctivae and EOM are normal. No scleral icterus.  Neck: Neck supple. No thyromegaly present.  Cardiovascular: Normal rate and regular rhythm. Exam reveals no gallop and no friction rub.  No murmur heard. Pulmonary/Chest: No stridor. She has no wheezes. She has no rales. She exhibits no tenderness.  Abdominal: She exhibits no distension. There is no tenderness. There is no rebound.  Musculoskeletal: Normal range of motion. She exhibits no edema.  Anasarca with edema to her knees  bilateral 3+  Lymphadenopathy:    She has no cervical adenopathy.  Neurological: She is oriented to person, place, and time. She exhibits normal muscle tone. Coordination normal.  Skin: No rash noted. No erythema.  Psychiatric: She has a normal mood and affect. Her behavior is normal.  Nursing note and vitals reviewed.    ED Treatments / Results  Labs (all labs ordered are listed, but only abnormal results are displayed) Labs Reviewed  BASIC METABOLIC PANEL - Abnormal; Notable for the following components:      Result Value   Potassium 3.0 (*)    Chloride 100 (*)    Glucose, Bld 109 (*)    Calcium 8.7 (*)    All other components within normal limits  CBC - Abnormal; Notable for the following components:   RBC 3.86 (*)    Hemoglobin 11.5 (*)    HCT 35.2 (*)    All other components within normal limits  PROTIME-INR  TROPONIN I  I-STAT BETA HCG BLOOD, ED ([facility], [facility], [facility] ONLY)    EKG  EKG Interpretation  Date/Time:  Saturday January 15 2018 11:30:33 EST Ventricular Rate:  81 PR Interval:    QRS Duration: 108 QT Interval:  405 QTC Calculation: 471 R Axis:   60 Text Interpretation:  Sinus rhythm Low voltage, precordial leads Confirmed by Milton Ferguson [id number]) on 01/15/2018 2:06:29 PM       Radiology Dg Chest Port 1 View  Result Date: 01/15/2018 CLINICAL DATA:  Shortness of breath and weakness. EXAM: PORTABLE CHEST 1 VIEW COMPARISON:  01/11/2018 FINDINGS: Stable top-normal heart size. There is no evidence of pulmonary edema, consolidation, pneumothorax, nodule or pleural fluid. The visualized skeletal structures are unremarkable. IMPRESSION: No active disease. Electronically Signed   By: Aletta Edouard M.D.   On: 01/15/2018 13:51    Procedures Procedures (including critical care time)  Medications Ordered in ED Medications  furosemide (LASIX) injection 40 mg (40 mg Intravenous Given 01/15/18 1323)  oxyCODONE-acetaminophen (PERCOCET/ROXICET) 5-325 MG per tablet 2 tablet  (2 tablets Oral Given 01/15/18 1421)     Initial Impression / Assessment and Plan / ED Course  I have reviewed the triage vital signs and the nursing notes.  Pertinent  labs & imaging results that were available during my care of the patient were reviewed by me and considered in my medical decision making (see chart for details).    Patient with anasarca.  She will be seen by hospitalist and disposition will be decided that   Final Clinical Impressions(s) / ED Diagnoses   Final diagnoses:  Peripheral edema    ED Discharge Orders    None       Milton Ferguson, MD 01/15/18 1449

## 2018-01-15 NOTE — ED Provider Notes (Signed)
Dr. Ladona Mow ([facility]) saw pt and spoke to her for a long time.  She does not need admission.  He recommended referral to wound care for unna boots, so I did that.  Pt is stable for d/c.   Isla Pence, MD 01/15/18 1622

## 2018-01-15 NOTE — ED Notes (Signed)
Bed: WA08 Expected date:  Expected time:  Means of arrival:  Comments: 62 yo edema

## 2018-01-15 NOTE — Discharge Instructions (Signed)
Ambulatory referral to wound care for unna boots to help with swelling.

## 2018-01-15 NOTE — ED Notes (Signed)
IV Ultrasound requested. 

## 2018-01-15 NOTE — Patient Instructions (Signed)
To ER via EMS for likely CHF exacerbation.

## 2018-01-16 DIAGNOSIS — R609 Edema, unspecified: Secondary | ICD-10-CM | POA: Insufficient documentation

## 2018-01-16 NOTE — Assessment & Plan Note (Addendum)
Mild situational, reassured, cont same tx

## 2018-01-16 NOTE — Assessment & Plan Note (Signed)
BP Readings from Last 3 Encounters:  01/15/18 (!) 128/43  01/15/18 132/76  01/12/18 128/82  stable overall by history and exam, recent data reviewed with pt, and pt to continue medical treatment as before,  to f/u any worsening symptoms or concerns

## 2018-01-16 NOTE — Assessment & Plan Note (Addendum)
Exam c/w diast chf vs other such as venous insufficiency, for BNP, repeat echo, to continue the lasix 60 qd for 3 days total as recommended, then 40 qd, check daily wts and f/u as planned

## 2018-01-18 ENCOUNTER — Ambulatory Visit: Payer: Self-pay

## 2018-01-18 NOTE — Telephone Encounter (Signed)
Patient called in with c/o "worsening edema." She says "I was seen in the Saturday clinic and was sent to the ER. The hospitalist doctor said I had lymphedema and this swelling would not go away. I don't understand, because I've been taking the lasix. My left hand, left leg and foot is more swollen than Saturday and last week when I saw Dr. [name] Reichmann. It hurts when I have to get up and even sitting. I tossed and turned all night because of the pain." I asked has her weight increased or decreased, she said "my weight has gone down, but the swelling is worse on my left side. The right side is fine and I can wear a shoe on the right foot, but my left foot is too swollen to wear a shoe." I asked is the leg red, she said "yes and it is shiny, feels like it will pop." She denies fever. She said she's SOB when she gets up to the bathroom, about 10-15 ft, but when she sits back down, it eases. According to protocol, see PCP within 24 hours, appointment made tomorrow with Jodi Mourning, FNP, care advice given, patient verbalized understanding.  Reason for Disposition . [1] MODERATE leg swelling (e.g., swelling extends up to knees) AND [2] new onset or worsening  Answer Assessment - Initial Assessment Questions 1. ONSET: "When did the swelling start?" (e.g., minutes, hours, days)     2 weeks ago 2. LOCATION: "What part of the leg is swollen?"  "Are both legs swollen or just one leg?"     Left leg, left hand, left foot, ankle 3. SEVERITY: "How bad is the swelling?" (e.g., localized; mild, moderate, severe)  - Localized - small area of swelling localized to one leg  - MILD pedal edema - swelling limited to foot and ankle, pitting edema < 1/4 inch (6 mm) deep, rest and elevation eliminate most or all swelling  - MODERATE edema - swelling of lower leg to knee, pitting edema > 1/4 inch (6 mm) deep, rest and elevation only partially reduce swelling  - SEVERE edema - swelling extends above knee, facial or hand swelling  present     Moderate 4. REDNESS: "Does the swelling look red or infected?"     Yes, skin shiny, tight 5. PAIN: "Is the swelling painful to touch?" If so, ask: "How painful is it?"   (Scale 1-10; mild, moderate or severe)     6 6. FEVER: "Do you have a fever?" If so, ask: "What is it, how was it measured, and when did it start?"      No 7. CAUSE: "What do you think is causing the leg swelling?"     Unsure 8. MEDICAL HISTORY: "Do you have a history of heart failure, kidney disease, liver failure, or cancer?"    Yes 9. RECURRENT SYMPTOM: "Have you had leg swelling before?" If so, ask: "When was the last time?" "What happened that time?"     Yes 10. OTHER SYMPTOMS: "Do you have any other symptoms?" (e.g., chest pain, difficulty breathing)       SOB with exertion 10 -15 ft 11. PREGNANCY: "Is there any chance you are pregnant?" "When was your last menstrual period?"      N/A  Protocols used: LEG SWELLING AND EDEMA-A-AH

## 2018-01-19 ENCOUNTER — Ambulatory Visit: Payer: Medicare Other | Admitting: Family

## 2018-01-19 ENCOUNTER — Other Ambulatory Visit: Payer: Self-pay | Admitting: Internal Medicine

## 2018-01-19 NOTE — Telephone Encounter (Signed)
Done erx to [facility]

## 2018-01-20 ENCOUNTER — Ambulatory Visit (HOSPITAL_COMMUNITY): Payer: No Typology Code available for payment source | Attending: Cardiology

## 2018-01-25 ENCOUNTER — Ambulatory Visit (INDEPENDENT_AMBULATORY_CARE_PROVIDER_SITE_OTHER): Payer: Medicare Other | Admitting: Internal Medicine

## 2018-01-25 ENCOUNTER — Encounter: Payer: Self-pay | Admitting: Internal Medicine

## 2018-01-25 VITALS — BP 128/76 | HR 103 | Temp 98.2°F | Ht 65.0 in | Wt 360.0 lb

## 2018-01-25 DIAGNOSIS — F411 Generalized anxiety disorder: Secondary | ICD-10-CM

## 2018-01-25 DIAGNOSIS — I10 Essential (primary) hypertension: Secondary | ICD-10-CM

## 2018-01-25 DIAGNOSIS — M4800 Spinal stenosis, site unspecified: Secondary | ICD-10-CM | POA: Diagnosis not present

## 2018-01-25 DIAGNOSIS — I890 Lymphedema, not elsewhere classified: Secondary | ICD-10-CM | POA: Diagnosis not present

## 2018-01-25 DIAGNOSIS — R269 Unspecified abnormalities of gait and mobility: Secondary | ICD-10-CM | POA: Diagnosis not present

## 2018-01-25 NOTE — Assessment & Plan Note (Signed)
Pt reassured, cont same tx

## 2018-01-25 NOTE — Assessment & Plan Note (Signed)
Chronic persistent, for PT at home

## 2018-01-25 NOTE — Progress Notes (Signed)
Subjective:    Patient ID: Maria [name], female    DOB: 1956/08/11, 62 y.o.   MRN: [id number]  HPI  Here to f/u, Pt denies chest pain, increased sob or doe, wheezing, orthopnea, PND, palpitations, dizziness or syncope, but has persistent LE edema not well controlled with diuretic left > right, assoc with generalized pain and difficulty walking  Pt denies new neurological symptoms such as new headache, or facial or extremity weakness or numbness   Pt denies polydipsia, polyuria.  No other new complaints Past Medical History:  Diagnosis Date  . Acute lymphadenitis 2011  . ALLERGIC RHINITIS 08/10/2007  . ANXIETY 12/06/2007  . Atherosclerotic peripheral vascular disease ([place]) 06/13/2013   Aorta on CT June 2014  . BACK PAIN 12/06/2007  . Cellulitis 2011  . Cervical disc disease 03/09/2012  . Chronic pain 03/09/2012  . DEPRESSION 12/06/2007  . DIVERTICULOSIS, [place] 12/06/2007  . GERD 12/06/2007  . Hepatitis    age 26 hepatitis A  . HYPERTENSION 12/06/2007  . Impaired glucose tolerance 03/13/2012  . LOW BACK PAIN 12/06/2007  . Lumbar disc disease 03/09/2012  . Mesenteric adenitis   . MRSA 2006  . Pain    by breast area  . Pain in joint, multiple sites 04/21/2010  . PARONYCHIA, FINGER 08/13/2009  . Pneumonia sept 2015   walking pneumonia, sepsis   . RASH-NONVESICULAR 05/09/2008  . 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 SHOULDER PAIN, LEFT 05/09/2008  . SINUSITIS- ACUTE-NOS 05/09/2008  . THORACIC/LUMBOSACRAL NEURITIS/RADICULITIS UNSPEC 12/28/2008   Past Surgical History:  Procedure Laterality Date  . ABDOMINAL HYSTERECTOMY  1999   1 ovary left  . bloo clot removed from neck   may 25th , june 2. 2010  . [place] SURGERY  may 24th 2010  . COLONOSCOPY WITH PROPOFOL N/A 07/10/2016   Procedure: COLONOSCOPY WITH PROPOFOL;  Surgeon: Manus Gunning, MD;  Location: [facility];  Service: Gastroenterology;  Laterality: N/A;  . ESOPHAGOGASTRODUODENOSCOPY (EGD) WITH PROPOFOL N/A  07/10/2016   Procedure: ESOPHAGOGASTRODUODENOSCOPY (EGD) WITH PROPOFOL;  Surgeon: Manus Gunning, MD;  Location: [facility];  Service: Gastroenterology;  Laterality: N/A;  . s/p ovary cyst    . s/p right knee arthroscopy     Dr. Mardelle Matte ortho    reports that she quit smoking about 9 years ago. She quit after 30.00 years of use. she has never used smokeless tobacco. She reports that she does not drink alcohol or use drugs. family history includes Asthma in her daughter and sister; Cancer in her maternal uncle and other; Diabetes in her mother; Heart disease in her mother; Heart failure in her mother; Hypertension in her father and mother; Liver cancer in her paternal grandmother. Allergies  Allergen Reactions  . Adhesive [Tape] Other (See Comments)    Tears skin off - use paper tape   . Amoxicillin-Pot Clavulanate Nausea And Vomiting  . Codeine Other (See Comments)    hallucinations  . Lactose Intolerance (Gi) Nausea And Vomiting  . Morphine And Related Nausea And Vomiting and Other (See Comments)    headaches  . Vicodin [Hydrocodone-Acetaminophen] Other (See Comments)    hallucinations  . Latex Rash   Current Outpatient Medications on File Prior to Visit  Medication Sig Dispense Refill  . clonazePAM (KLONOPIN) 1 MG tablet Take 1 tablet (1 mg total) 2 (two) times daily as needed by mouth for anxiety. 60 tablet 2  . cyclobenzaprine (FLEXERIL) 10 MG tablet Take 1 tablet (10 mg total) daily as needed by mouth  for muscle spasms. 90 tablet 1  . dicyclomine (BENTYL) 10 MG capsule Take 1 capsule (10 mg total) 4 (four) times daily -  before meals and at bedtime by mouth. (Patient taking differently: Take 10 mg by mouth 3 (three) times daily as needed for spasms. ) 120 capsule 2  . furosemide (LASIX) 20 MG tablet Take 1 tablet (20 mg total) daily as needed by mouth for fluid. 90 tablet 3  . gabapentin (NEURONTIN) 300 MG capsule Take 1 capsule (300 mg total) 3 (three) times daily by mouth.  90 capsule 3  . losartan (COZAAR) 50 MG tablet Take 50 mg by mouth daily.     . Oxycodone HCl 10 MG TABS Take 1 tablet (10 mg total) 4 (four) times daily by mouth. Per [facility] 30 tablet 0  . pantoprazole (PROTONIX) 40 MG tablet Take 1 tablet (40 mg total) daily by mouth. 90 tablet 3  . sertraline (ZOLOFT) 100 MG tablet TAKE 1 TABLET BY MOUTH EVERY DAY 90 tablet 3  . triamcinolone cream (KENALOG) 0.1 % Apply 1 application 2 (two) times daily as needed topically (rash). 30 g 11  . [DISCONTINUED] DULoxetine (CYMBALTA) 60 MG capsule Take 60 mg by mouth daily.      . [DISCONTINUED] fexofenadine (ALLEGRA) 180 MG tablet Take 180 mg by mouth daily.       No current facility-administered medications on file prior to visit.    Review of Systems  Constitutional: Negative for other unusual diaphoresis or sweats HENT: Negative for ear discharge or swelling Eyes: Negative for other worsening visual disturbances Respiratory: Negative for stridor or other swelling  Gastrointestinal: Negative for worsening distension or other blood Genitourinary: Negative for retention or other urinary change Musculoskeletal: Negative for other MSK pain or swelling Skin: Negative for color change or other new lesions Neurological: Negative for worsening tremors and other numbness  Psychiatric/Behavioral: Negative for worsening agitation or other fatigue All other system neg per pt    Objective:   Physical Exam BP 128/76   Pulse (!) 103   Temp 98.2 F (36.8 C) (Oral)   Ht 5\' 5"  (1.651 m)   Wt (!) 360 lb (163.3 kg)   SpO2 98%   BMI 59.91 kg/m  VS noted,  Constitutional: Pt appears in NAD HENT: Head: NCAT.  Right Ear: External ear normal.  Left Ear: External ear normal.  Eyes: . Pupils are equal, round, and reactive to light. Conjunctivae and EOM are normal Nose: without d/c or deformity Neck: Neck supple. Gross normal ROM Cardiovascular: Normal rate and regular rhythm.   Pulmonary/Chest: Effort  normal and breath sounds without rales or wheezing.  Abd:  Soft, NT, ND, + BS, no organomegaly Neurological: Pt is alert. At baseline orientation, motor grossly intact Skin: Skin is warm. No rashes, other new lesions, with bilat LE edema above knees 2-3+ left, 1+ on right Psychiatric: Pt behavior is normal without agitation , 1-2+ nervous No other exam findings    Assessment & Plan:

## 2018-01-25 NOTE — Patient Instructions (Signed)
Please continue all other medications as before, and refills have been done if requested.  Please have the pharmacy call with any other refills you may need.  Please continue your efforts at being more active, low cholesterol diet, and weight control.  You are otherwise up to date with prevention measures today.  Please keep your appointments with your specialists as you may have planned  You will be contacted regarding the referral for: Home Health, for Nurse, Physical Therapy and Skin Care consult for Unna Boots for both legs  Please return in 3 months, or sooner if needed

## 2018-01-25 NOTE — Assessment & Plan Note (Signed)
With pain and gait dysfxn, for [facility] with RN, PT, skin care consult with [facility], cont all other same tx

## 2018-01-25 NOTE — Assessment & Plan Note (Signed)
stable overall by history and exam, recent data reviewed with pt, and pt to continue medical treatment as before,  to f/u any worsening symptoms or concerns BP Readings from Last 3 Encounters:  01/25/18 128/76  01/15/18 (!) 128/43  01/15/18 132/76

## 2018-01-25 NOTE — Assessment & Plan Note (Signed)
For PT with [facility]

## 2018-01-26 DIAGNOSIS — G894 Chronic pain syndrome: Secondary | ICD-10-CM | POA: Diagnosis not present

## 2018-01-26 DIAGNOSIS — G8929 Other chronic pain: Secondary | ICD-10-CM | POA: Diagnosis not present

## 2018-01-26 DIAGNOSIS — M25562 Pain in left knee: Secondary | ICD-10-CM | POA: Diagnosis not present

## 2018-01-26 DIAGNOSIS — M25561 Pain in right knee: Secondary | ICD-10-CM | POA: Diagnosis not present

## 2018-01-26 DIAGNOSIS — M545 Low back pain: Secondary | ICD-10-CM | POA: Diagnosis not present

## 2018-01-26 DIAGNOSIS — M25512 Pain in left shoulder: Secondary | ICD-10-CM | POA: Diagnosis not present

## 2018-01-26 DIAGNOSIS — Z79891 Long term (current) use of opiate analgesic: Secondary | ICD-10-CM | POA: Diagnosis not present

## 2018-01-28 ENCOUNTER — Telehealth: Payer: Self-pay | Admitting: Internal Medicine

## 2018-01-28 MED ORDER — CLONAZEPAM 1 MG PO TABS: 1.0000 mg | ORAL_TABLET | Freq: Two times a day (BID) | ORAL | 2 refills | Status: DC | PRN

## 2018-01-28 NOTE — Telephone Encounter (Signed)
Copied from [place]. Topic: Quick Communication - Rx Refill/Question >> Jan 28, 2018  1:44 PM Oliver Pila B wrote: Medication:  clonazePAM (KLONOPIN) 1 MG tablet [[id number]]    Has the patient contacted their pharmacy? Yes.     (Agent: If no, request that the patient contact the pharmacy for the refill.)   Preferred Pharmacy (with phone number or street name): [facility]   Agent: Please be advised that RX refills may take up to 3 business days. We ask that you follow-up with your pharmacy.

## 2018-01-28 NOTE — Telephone Encounter (Signed)
Klonopin done erx to [facility] per pt reqeust for change in pharmacy, as med is free through [employer]

## 2018-01-31 ENCOUNTER — Encounter: Payer: Self-pay | Admitting: Internal Medicine

## 2018-01-31 ENCOUNTER — Ambulatory Visit (HOSPITAL_COMMUNITY): Payer: Medicare Other | Attending: Cardiology

## 2018-01-31 ENCOUNTER — Other Ambulatory Visit: Payer: Self-pay

## 2018-01-31 DIAGNOSIS — I071 Rheumatic tricuspid insufficiency: Secondary | ICD-10-CM | POA: Diagnosis not present

## 2018-01-31 DIAGNOSIS — I5030 Unspecified diastolic (congestive) heart failure: Secondary | ICD-10-CM | POA: Diagnosis not present

## 2018-02-03 ENCOUNTER — Ambulatory Visit: Payer: Self-pay | Admitting: *Deleted

## 2018-02-03 NOTE — Telephone Encounter (Signed)
Pt  Reports leg   Swelling for  About  1  Month   She  Reports   Usually  Is  releived by lasix   But   It is  Not getting  Better . Denies   Any  Chest pain or  Shortness  Of  Breath .  Appointment made  With Maria [name]  For tommorow  At  3 pmPt  Was  adised  To  Call  911  If any  Chest pain or  Shortness  Of breath   Reason for Disposition . [1] MODERATE leg swelling (e.g., swelling extends up to knees) AND [2] new onset or worsening  Answer Assessment - Initial Assessment Questions 1. ONSET: "When did the swelling start?" (e.g., minutes, hours, days)       4   Weeks  Ago   2. LOCATION: "What part of the leg is swollen?"  "Are both legs swollen or just one leg?"         Both  Legs  Swelling   From  Knees  Down  To  Feet    3. SEVERITY: "How bad is the swelling?" (e.g., localized; mild, moderate, severe)  - Localized - small area of swelling localized to one leg  - MILD pedal edema - swelling limited to foot and ankle, pitting edema < 1/4 inch (6 mm) deep, rest and elevation eliminate most or all swelling  - MODERATE edema - swelling of lower leg to knee, pitting edema > 1/4 inch (6 mm) deep, rest and elevation only partially reduce swelling  - SEVERE edema - swelling extends above knee, facial or hand swelling present        Severe   l  Hand   Swelling   4. REDNESS: "Does the swelling look red or infected?"        L leg  Is  Warm   And  Red   5. PAIN: "Is the swelling painful to touch?" If so, ask: "How painful is it?"   (Scale 1-10; mild, moderate or severe)        10   Worse   When  She   Bears   Weight   6. FEVER: "Do you have a fever?" If so, ask: "What is it, how was it measured, and when did it start?"        No 7. CAUSE: "What do you think is causing the leg swelling?"       FLUID   8. MEDICAL HISTORY: "Do you have a history of heart failure, kidney disease, liver failure, or cancer?"       Mom  Had  chf      9. RECURRENT SYMPTOM: "Have you had leg swelling before?" If so, ask:  "When was the last time?" "What happened that time?"       Has  Had  Swelling  In  Past   Usually  Lasix  releives  It  Bot not this  Time    10. OTHER SYMPTOMS: "Do you have any other symptoms?" (e.g., chest pain, difficulty breathing)         Gets   Short  Of  Breath on  Exertion    Fell  9  Days  Ago   No  Obvious  Injury    Reports some  Low  Back  Pain   11. PREGNANCY: "Is there any chance you are pregnant?" "When was your last menstrual period?"  n/a  Protocols used: LEG SWELLING AND EDEMA-A-AH

## 2018-02-04 ENCOUNTER — Ambulatory Visit (INDEPENDENT_AMBULATORY_CARE_PROVIDER_SITE_OTHER): Payer: Medicare Other | Admitting: Family Medicine

## 2018-02-04 ENCOUNTER — Encounter: Payer: Self-pay | Admitting: Family Medicine

## 2018-02-04 DIAGNOSIS — I890 Lymphedema, not elsewhere classified: Secondary | ICD-10-CM

## 2018-02-04 NOTE — Assessment & Plan Note (Signed)
Bilateral swelling of lower extremities related to lymphedema. She has been seen for this for the past few weeks. Home health has been ordered but has not been to her house to [place]. She denies any shortness of breath out of the usual. - Advised use Ace wraps on her legs to help with the swelling. - Advised that home health should be in contact with her in order to apply Unna boots. If they have not called then we may need to make another referral for an Scientist, research (life sciences).

## 2018-02-04 NOTE — Progress Notes (Signed)
Maria [name] - 62 y.o. female MRN [id number]  Date of birth: February 03, 1956  SUBJECTIVE:  Including CC & ROS.  Chief Complaint  Patient presents with  . Bilateral leg swelling    Maria [name] is a 62 y.o. female that is is presenting with bilateral leg swelling.Ongoing for one month. She has been to the ER on 01/11/18 and 01/15/18 the same symptoms. She has been taking Lasix with no improvement in her symptoms. She states the pain is severe in nature.      Review of Systems  Constitutional: Negative for fever.  Respiratory: Positive for shortness of breath.   Cardiovascular: Positive for leg swelling.  Gastrointestinal: Negative for abdominal pain.  Musculoskeletal: Positive for gait problem.  Hematological: Negative for adenopathy.  Psychiatric/Behavioral: Negative for agitation.    HISTORY: Past Medical, Surgical, Social, and Family History Reviewed & Updated per EMR.   Pertinent Historical Findings include:  Past Medical History:  Diagnosis Date  . Acute lymphadenitis 2011  . ALLERGIC RHINITIS 08/10/2007  . ANXIETY 12/06/2007  . Atherosclerotic peripheral vascular disease ([place]) 06/13/2013   Aorta on CT June 2014  . BACK PAIN 12/06/2007  . Cellulitis 2011  . Cervical disc disease 03/09/2012  . Chronic pain 03/09/2012  . DEPRESSION 12/06/2007  . DIVERTICULOSIS, [place] 12/06/2007  . GERD 12/06/2007  . Hepatitis    age 77 hepatitis A  . HYPERTENSION 12/06/2007  . Impaired glucose tolerance 03/13/2012  . LOW BACK PAIN 12/06/2007  . Lumbar disc disease 03/09/2012  . Mesenteric adenitis   . MRSA 2006  . Pain    by breast area  . Pain in joint, multiple sites 04/21/2010  . PARONYCHIA, FINGER 08/13/2009  . Pneumonia sept 2015   walking pneumonia, sepsis   . RASH-NONVESICULAR 05/09/2008  . 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 SHOULDER PAIN, LEFT 05/09/2008  . SINUSITIS- ACUTE-NOS 05/09/2008  . THORACIC/LUMBOSACRAL NEURITIS/RADICULITIS UNSPEC 12/28/2008    Past Surgical  History:  Procedure Laterality Date  . ABDOMINAL HYSTERECTOMY  1999   1 ovary left  . bloo clot removed from neck   may 25th , june 2. 2010  . [place] SURGERY  may 24th 2010  . COLONOSCOPY WITH PROPOFOL N/A 07/10/2016   Procedure: COLONOSCOPY WITH PROPOFOL;  Surgeon: Manus Gunning, MD;  Location: [facility];  Service: Gastroenterology;  Laterality: N/A;  . ESOPHAGOGASTRODUODENOSCOPY (EGD) WITH PROPOFOL N/A 07/10/2016   Procedure: ESOPHAGOGASTRODUODENOSCOPY (EGD) WITH PROPOFOL;  Surgeon: Manus Gunning, MD;  Location: [facility];  Service: Gastroenterology;  Laterality: N/A;  . s/p ovary cyst    . s/p right knee arthroscopy     Dr. Mardelle Matte ortho    Allergies  Allergen Reactions  . Adhesive [Tape] Other (See Comments)    Tears skin off - use paper tape   . Amoxicillin-Pot Clavulanate Nausea And Vomiting  . Codeine Other (See Comments)    hallucinations  . Lactose Intolerance (Gi) Nausea And Vomiting  . Morphine And Related Nausea And Vomiting and Other (See Comments)    headaches  . Vicodin [Hydrocodone-Acetaminophen] Other (See Comments)    hallucinations  . Latex Rash    Family History  Problem Relation Age of Onset  . Heart disease Mother   . Hypertension Mother   . Diabetes Mother   . Heart failure Mother   . Asthma Sister   . Hypertension Father   . Asthma Daughter   . Cancer Maternal Uncle        Cardona  . Cancer Other  ovarian  . Liver cancer Paternal Grandmother        ????     Social History   Socioeconomic History  . Marital status: Divorced    Spouse name: Not on file  . Number of children: 2  . Years of education: Not on file  . Highest education level: Not on file  Social Needs  . Financial resource strain: Very hard  . Food insecurity - worry: Often true  . Food insecurity - inability: Often true  . Transportation needs - medical: Yes  . Transportation needs - non-medical: Yes  Occupational History  . Occupation:  Chemical engineer: Forensic scientist  . 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 Smoking status: Former Smoker    Years: 30.00    Last attempt to quit: 11/08/2008    Years since quitting: 9.2  . Smokeless tobacco: Never Used  . Tobacco comment: quit 10/09  Substance and Sexual Activity  . Alcohol use: No  . Drug use: No  . Sexual activity: No    Birth control/protection: Surgical  Other Topics Concern  . Not on file  Social History Narrative   Patient has difficulty financially affording food, medication, and affording essential bills. She states she has limited support from her daughter.      PHYSICAL EXAM:  VS: BP 134/68 (BP Location: Left Arm, Patient Position: Sitting, Cuff Size: Normal)   Pulse 84   Temp 98.3 F (36.8 C) (Oral)   Ht 5\' 5"  (1.651 m)   Wt (!) 372 lb (168.7 kg)   SpO2 98%   BMI 61.90 kg/m  Physical Exam Gen: NAD, alert, cooperative with exam,  ENT: normal lips, normal nasal mucosa,  Eye: normal EOM, normal conjunctiva and lids CV: +2 pitting edema, +2 pedal pulses, regular rate and rhythm, S1-S2   Resp: no accessory muscle use, non-labored, no wheezing or crackles Skin: no rashes, no areas of induration  Neuro: normal tone, normal sensation to touch Psych:  normal insight, alert and oriented MSK: Able with single-point cane. Tenderness to palpation throughout the lower extremity bilaterally, neurovascular intact      ASSESSMENT & PLAN:   Acquired lymphedema Bilateral swelling of lower extremities related to lymphedema. She has been seen for this for the past few weeks. Home health has been ordered but has not been to her house to [place]. She denies any shortness of breath out of the usual. - Advised use Ace wraps on her legs to help with the swelling. - Advised that home health should be in contact with her in order to apply Unna boots. If they have not called then we may need to make another referral for  an Scientist, research (life sciences).

## 2018-02-04 NOTE — Patient Instructions (Signed)
Please try to use Ace wraps around her legs to help with compression. Please try to elevate legs above your heart. Please try to obtain an Unna boot by either home health or through the wound care center.

## 2018-02-06 DIAGNOSIS — K5730 Diverticulosis of large intestine without perforation or abscess without bleeding: Secondary | ICD-10-CM | POA: Diagnosis not present

## 2018-02-06 DIAGNOSIS — I10 Essential (primary) hypertension: Secondary | ICD-10-CM | POA: Diagnosis not present

## 2018-02-06 DIAGNOSIS — M47816 Spondylosis without myelopathy or radiculopathy, lumbar region: Secondary | ICD-10-CM | POA: Diagnosis not present

## 2018-02-06 DIAGNOSIS — K760 Fatty (change of) liver, not elsewhere classified: Secondary | ICD-10-CM | POA: Diagnosis not present

## 2018-02-06 DIAGNOSIS — Z9181 History of falling: Secondary | ICD-10-CM | POA: Diagnosis not present

## 2018-02-06 DIAGNOSIS — R7303 Prediabetes: Secondary | ICD-10-CM | POA: Diagnosis not present

## 2018-02-06 DIAGNOSIS — L97821 Non-pressure chronic ulcer of other part of left lower leg limited to breakdown of skin: Secondary | ICD-10-CM | POA: Diagnosis not present

## 2018-02-06 DIAGNOSIS — Z87891 Personal history of nicotine dependence: Secondary | ICD-10-CM | POA: Diagnosis not present

## 2018-02-06 DIAGNOSIS — M797 Fibromyalgia: Secondary | ICD-10-CM | POA: Diagnosis not present

## 2018-02-06 DIAGNOSIS — M172 Bilateral post-traumatic osteoarthritis of knee: Secondary | ICD-10-CM | POA: Diagnosis not present

## 2018-02-06 DIAGNOSIS — M48061 Spinal stenosis, lumbar region without neurogenic claudication: Secondary | ICD-10-CM | POA: Diagnosis not present

## 2018-02-06 DIAGNOSIS — G894 Chronic pain syndrome: Secondary | ICD-10-CM | POA: Diagnosis not present

## 2018-02-06 DIAGNOSIS — L97811 Non-pressure chronic ulcer of other part of right lower leg limited to breakdown of skin: Secondary | ICD-10-CM | POA: Diagnosis not present

## 2018-02-06 DIAGNOSIS — I872 Venous insufficiency (chronic) (peripheral): Secondary | ICD-10-CM | POA: Diagnosis not present

## 2018-02-06 DIAGNOSIS — I739 Peripheral vascular disease, unspecified: Secondary | ICD-10-CM | POA: Diagnosis not present

## 2018-02-07 ENCOUNTER — Telehealth: Payer: Self-pay | Admitting: Internal Medicine

## 2018-02-07 DIAGNOSIS — K760 Fatty (change of) liver, not elsewhere classified: Secondary | ICD-10-CM | POA: Diagnosis not present

## 2018-02-07 DIAGNOSIS — M48061 Spinal stenosis, lumbar region without neurogenic claudication: Secondary | ICD-10-CM | POA: Diagnosis not present

## 2018-02-07 DIAGNOSIS — I739 Peripheral vascular disease, unspecified: Secondary | ICD-10-CM | POA: Diagnosis not present

## 2018-02-07 DIAGNOSIS — I10 Essential (primary) hypertension: Secondary | ICD-10-CM | POA: Diagnosis not present

## 2018-02-07 DIAGNOSIS — R7303 Prediabetes: Secondary | ICD-10-CM | POA: Diagnosis not present

## 2018-02-07 DIAGNOSIS — M172 Bilateral post-traumatic osteoarthritis of knee: Secondary | ICD-10-CM | POA: Diagnosis not present

## 2018-02-07 DIAGNOSIS — M47816 Spondylosis without myelopathy or radiculopathy, lumbar region: Secondary | ICD-10-CM | POA: Diagnosis not present

## 2018-02-07 DIAGNOSIS — I872 Venous insufficiency (chronic) (peripheral): Secondary | ICD-10-CM | POA: Diagnosis not present

## 2018-02-07 DIAGNOSIS — G894 Chronic pain syndrome: Secondary | ICD-10-CM | POA: Diagnosis not present

## 2018-02-07 DIAGNOSIS — Z87891 Personal history of nicotine dependence: Secondary | ICD-10-CM | POA: Diagnosis not present

## 2018-02-07 DIAGNOSIS — L97811 Non-pressure chronic ulcer of other part of right lower leg limited to breakdown of skin: Secondary | ICD-10-CM | POA: Diagnosis not present

## 2018-02-07 DIAGNOSIS — L97821 Non-pressure chronic ulcer of other part of left lower leg limited to breakdown of skin: Secondary | ICD-10-CM | POA: Diagnosis not present

## 2018-02-07 DIAGNOSIS — Z9181 History of falling: Secondary | ICD-10-CM | POA: Diagnosis not present

## 2018-02-07 DIAGNOSIS — K5730 Diverticulosis of large intestine without perforation or abscess without bleeding: Secondary | ICD-10-CM | POA: Diagnosis not present

## 2018-02-07 DIAGNOSIS — M797 Fibromyalgia: Secondary | ICD-10-CM | POA: Diagnosis not present

## 2018-02-07 NOTE — Telephone Encounter (Signed)
Copied from [place]. Topic: General - Other >> Feb 07, 2018 10:51 AM Cecelia Byars, NT wrote: Reason for CRM:  [facility] home health called and needs verbal orders for disease management and wound care requesting nursing 2 x a week for 2 weeks , 1 x a week for 7 weeks  and 3 prn visits also requesting the patient has occupational therapy as well as physical therapy evaluation and she will also have a tele health unit in the home  ,please call [phone]

## 2018-02-07 NOTE — Telephone Encounter (Signed)
Ok for verbals 

## 2018-02-07 NOTE — Telephone Encounter (Signed)
Notified Nicolette w/MD response../lmb 

## 2018-02-09 ENCOUNTER — Telehealth: Payer: Self-pay

## 2018-02-09 DIAGNOSIS — L97822 Non-pressure chronic ulcer of other part of left lower leg with fat layer exposed: Secondary | ICD-10-CM | POA: Diagnosis not present

## 2018-02-09 DIAGNOSIS — L97812 Non-pressure chronic ulcer of other part of right lower leg with fat layer exposed: Secondary | ICD-10-CM | POA: Diagnosis not present

## 2018-02-09 NOTE — Telephone Encounter (Signed)
Please advise.  Copied from [place]. Topic: General - Other >> Feb 09, 2018  2:53 PM Carolyn Stare wrote:  Pt call to say she had [place] done and still waiting on her results and is asking for a call back    [phone]

## 2018-02-09 NOTE — Telephone Encounter (Signed)
Pt has been informed and expressed understanding.  

## 2018-02-09 NOTE — Telephone Encounter (Signed)
I see a letter was sent about feb 11, but will resend to patient  Heart function was overall Very good, no need for any change in tx

## 2018-02-11 DIAGNOSIS — Z9181 History of falling: Secondary | ICD-10-CM | POA: Diagnosis not present

## 2018-02-11 DIAGNOSIS — M172 Bilateral post-traumatic osteoarthritis of knee: Secondary | ICD-10-CM | POA: Diagnosis not present

## 2018-02-11 DIAGNOSIS — K760 Fatty (change of) liver, not elsewhere classified: Secondary | ICD-10-CM | POA: Diagnosis not present

## 2018-02-11 DIAGNOSIS — K5730 Diverticulosis of large intestine without perforation or abscess without bleeding: Secondary | ICD-10-CM | POA: Diagnosis not present

## 2018-02-11 DIAGNOSIS — M48061 Spinal stenosis, lumbar region without neurogenic claudication: Secondary | ICD-10-CM | POA: Diagnosis not present

## 2018-02-11 DIAGNOSIS — M797 Fibromyalgia: Secondary | ICD-10-CM | POA: Diagnosis not present

## 2018-02-11 DIAGNOSIS — I739 Peripheral vascular disease, unspecified: Secondary | ICD-10-CM | POA: Diagnosis not present

## 2018-02-11 DIAGNOSIS — L97821 Non-pressure chronic ulcer of other part of left lower leg limited to breakdown of skin: Secondary | ICD-10-CM | POA: Diagnosis not present

## 2018-02-11 DIAGNOSIS — G894 Chronic pain syndrome: Secondary | ICD-10-CM | POA: Diagnosis not present

## 2018-02-11 DIAGNOSIS — R7303 Prediabetes: Secondary | ICD-10-CM | POA: Diagnosis not present

## 2018-02-11 DIAGNOSIS — Z87891 Personal history of nicotine dependence: Secondary | ICD-10-CM | POA: Diagnosis not present

## 2018-02-11 DIAGNOSIS — I10 Essential (primary) hypertension: Secondary | ICD-10-CM | POA: Diagnosis not present

## 2018-02-11 DIAGNOSIS — I872 Venous insufficiency (chronic) (peripheral): Secondary | ICD-10-CM | POA: Diagnosis not present

## 2018-02-11 DIAGNOSIS — L97811 Non-pressure chronic ulcer of other part of right lower leg limited to breakdown of skin: Secondary | ICD-10-CM | POA: Diagnosis not present

## 2018-02-11 DIAGNOSIS — M47816 Spondylosis without myelopathy or radiculopathy, lumbar region: Secondary | ICD-10-CM | POA: Diagnosis not present

## 2018-02-14 ENCOUNTER — Telehealth: Payer: Self-pay | Admitting: Internal Medicine

## 2018-02-14 DIAGNOSIS — K760 Fatty (change of) liver, not elsewhere classified: Secondary | ICD-10-CM | POA: Diagnosis not present

## 2018-02-14 DIAGNOSIS — K5730 Diverticulosis of large intestine without perforation or abscess without bleeding: Secondary | ICD-10-CM | POA: Diagnosis not present

## 2018-02-14 DIAGNOSIS — R7303 Prediabetes: Secondary | ICD-10-CM | POA: Diagnosis not present

## 2018-02-14 DIAGNOSIS — L97811 Non-pressure chronic ulcer of other part of right lower leg limited to breakdown of skin: Secondary | ICD-10-CM | POA: Diagnosis not present

## 2018-02-14 DIAGNOSIS — I739 Peripheral vascular disease, unspecified: Secondary | ICD-10-CM | POA: Diagnosis not present

## 2018-02-14 DIAGNOSIS — G894 Chronic pain syndrome: Secondary | ICD-10-CM | POA: Diagnosis not present

## 2018-02-14 DIAGNOSIS — M48061 Spinal stenosis, lumbar region without neurogenic claudication: Secondary | ICD-10-CM | POA: Diagnosis not present

## 2018-02-14 DIAGNOSIS — I872 Venous insufficiency (chronic) (peripheral): Secondary | ICD-10-CM | POA: Diagnosis not present

## 2018-02-14 DIAGNOSIS — L97821 Non-pressure chronic ulcer of other part of left lower leg limited to breakdown of skin: Secondary | ICD-10-CM | POA: Diagnosis not present

## 2018-02-14 DIAGNOSIS — I10 Essential (primary) hypertension: Secondary | ICD-10-CM | POA: Diagnosis not present

## 2018-02-14 DIAGNOSIS — M797 Fibromyalgia: Secondary | ICD-10-CM | POA: Diagnosis not present

## 2018-02-14 NOTE — Telephone Encounter (Signed)
Ok for verbals 

## 2018-02-14 NOTE — Telephone Encounter (Signed)
Copied from [place] [phone]. Topic: Quick Communication - See Telephone Encounter >> Feb 14, 2018  2:59 PM Vernona Rieger wrote: CRM for notification. See Telephone encounter for:   02/14/18.  Nicollete RN from [employer] needs verbal orders for medical social work eval to help patient with community resources.  Call back is [phone]

## 2018-02-15 DIAGNOSIS — G894 Chronic pain syndrome: Secondary | ICD-10-CM | POA: Diagnosis not present

## 2018-02-15 DIAGNOSIS — L97811 Non-pressure chronic ulcer of other part of right lower leg limited to breakdown of skin: Secondary | ICD-10-CM | POA: Diagnosis not present

## 2018-02-15 DIAGNOSIS — M797 Fibromyalgia: Secondary | ICD-10-CM | POA: Diagnosis not present

## 2018-02-15 DIAGNOSIS — K5730 Diverticulosis of large intestine without perforation or abscess without bleeding: Secondary | ICD-10-CM | POA: Diagnosis not present

## 2018-02-15 DIAGNOSIS — I739 Peripheral vascular disease, unspecified: Secondary | ICD-10-CM | POA: Diagnosis not present

## 2018-02-15 DIAGNOSIS — I10 Essential (primary) hypertension: Secondary | ICD-10-CM | POA: Diagnosis not present

## 2018-02-15 DIAGNOSIS — R7303 Prediabetes: Secondary | ICD-10-CM | POA: Diagnosis not present

## 2018-02-15 DIAGNOSIS — L97821 Non-pressure chronic ulcer of other part of left lower leg limited to breakdown of skin: Secondary | ICD-10-CM | POA: Diagnosis not present

## 2018-02-15 DIAGNOSIS — K760 Fatty (change of) liver, not elsewhere classified: Secondary | ICD-10-CM | POA: Diagnosis not present

## 2018-02-15 DIAGNOSIS — I872 Venous insufficiency (chronic) (peripheral): Secondary | ICD-10-CM | POA: Diagnosis not present

## 2018-02-15 DIAGNOSIS — M48061 Spinal stenosis, lumbar region without neurogenic claudication: Secondary | ICD-10-CM | POA: Diagnosis not present

## 2018-02-15 NOTE — Telephone Encounter (Signed)
Verbal orders have been given to Nicollete.

## 2018-02-16 DIAGNOSIS — G894 Chronic pain syndrome: Secondary | ICD-10-CM | POA: Diagnosis not present

## 2018-02-16 DIAGNOSIS — R7303 Prediabetes: Secondary | ICD-10-CM | POA: Diagnosis not present

## 2018-02-16 DIAGNOSIS — M48061 Spinal stenosis, lumbar region without neurogenic claudication: Secondary | ICD-10-CM | POA: Diagnosis not present

## 2018-02-16 DIAGNOSIS — K5730 Diverticulosis of large intestine without perforation or abscess without bleeding: Secondary | ICD-10-CM | POA: Diagnosis not present

## 2018-02-16 DIAGNOSIS — L97821 Non-pressure chronic ulcer of other part of left lower leg limited to breakdown of skin: Secondary | ICD-10-CM | POA: Diagnosis not present

## 2018-02-16 DIAGNOSIS — K760 Fatty (change of) liver, not elsewhere classified: Secondary | ICD-10-CM | POA: Diagnosis not present

## 2018-02-16 DIAGNOSIS — I739 Peripheral vascular disease, unspecified: Secondary | ICD-10-CM | POA: Diagnosis not present

## 2018-02-16 DIAGNOSIS — L97811 Non-pressure chronic ulcer of other part of right lower leg limited to breakdown of skin: Secondary | ICD-10-CM | POA: Diagnosis not present

## 2018-02-16 DIAGNOSIS — I872 Venous insufficiency (chronic) (peripheral): Secondary | ICD-10-CM | POA: Diagnosis not present

## 2018-02-16 DIAGNOSIS — I10 Essential (primary) hypertension: Secondary | ICD-10-CM | POA: Diagnosis not present

## 2018-02-16 DIAGNOSIS — M797 Fibromyalgia: Secondary | ICD-10-CM | POA: Diagnosis not present

## 2018-02-17 DIAGNOSIS — K5730 Diverticulosis of large intestine without perforation or abscess without bleeding: Secondary | ICD-10-CM | POA: Diagnosis not present

## 2018-02-17 DIAGNOSIS — L97821 Non-pressure chronic ulcer of other part of left lower leg limited to breakdown of skin: Secondary | ICD-10-CM | POA: Diagnosis not present

## 2018-02-17 DIAGNOSIS — R7303 Prediabetes: Secondary | ICD-10-CM | POA: Diagnosis not present

## 2018-02-17 DIAGNOSIS — G894 Chronic pain syndrome: Secondary | ICD-10-CM | POA: Diagnosis not present

## 2018-02-17 DIAGNOSIS — I872 Venous insufficiency (chronic) (peripheral): Secondary | ICD-10-CM | POA: Diagnosis not present

## 2018-02-17 DIAGNOSIS — M797 Fibromyalgia: Secondary | ICD-10-CM | POA: Diagnosis not present

## 2018-02-17 DIAGNOSIS — L97811 Non-pressure chronic ulcer of other part of right lower leg limited to breakdown of skin: Secondary | ICD-10-CM | POA: Diagnosis not present

## 2018-02-17 DIAGNOSIS — K760 Fatty (change of) liver, not elsewhere classified: Secondary | ICD-10-CM | POA: Diagnosis not present

## 2018-02-17 DIAGNOSIS — I10 Essential (primary) hypertension: Secondary | ICD-10-CM | POA: Diagnosis not present

## 2018-02-17 DIAGNOSIS — M48061 Spinal stenosis, lumbar region without neurogenic claudication: Secondary | ICD-10-CM | POA: Diagnosis not present

## 2018-02-17 DIAGNOSIS — I739 Peripheral vascular disease, unspecified: Secondary | ICD-10-CM | POA: Diagnosis not present

## 2018-02-18 DIAGNOSIS — L97811 Non-pressure chronic ulcer of other part of right lower leg limited to breakdown of skin: Secondary | ICD-10-CM | POA: Diagnosis not present

## 2018-02-18 DIAGNOSIS — M797 Fibromyalgia: Secondary | ICD-10-CM | POA: Diagnosis not present

## 2018-02-18 DIAGNOSIS — K5730 Diverticulosis of large intestine without perforation or abscess without bleeding: Secondary | ICD-10-CM | POA: Diagnosis not present

## 2018-02-18 DIAGNOSIS — R7303 Prediabetes: Secondary | ICD-10-CM | POA: Diagnosis not present

## 2018-02-18 DIAGNOSIS — M48061 Spinal stenosis, lumbar region without neurogenic claudication: Secondary | ICD-10-CM | POA: Diagnosis not present

## 2018-02-18 DIAGNOSIS — K760 Fatty (change of) liver, not elsewhere classified: Secondary | ICD-10-CM | POA: Diagnosis not present

## 2018-02-18 DIAGNOSIS — I872 Venous insufficiency (chronic) (peripheral): Secondary | ICD-10-CM | POA: Diagnosis not present

## 2018-02-18 DIAGNOSIS — L97821 Non-pressure chronic ulcer of other part of left lower leg limited to breakdown of skin: Secondary | ICD-10-CM | POA: Diagnosis not present

## 2018-02-18 DIAGNOSIS — I739 Peripheral vascular disease, unspecified: Secondary | ICD-10-CM | POA: Diagnosis not present

## 2018-02-18 DIAGNOSIS — I10 Essential (primary) hypertension: Secondary | ICD-10-CM | POA: Diagnosis not present

## 2018-02-18 DIAGNOSIS — G894 Chronic pain syndrome: Secondary | ICD-10-CM | POA: Diagnosis not present

## 2018-02-22 DIAGNOSIS — I739 Peripheral vascular disease, unspecified: Secondary | ICD-10-CM | POA: Diagnosis not present

## 2018-02-22 DIAGNOSIS — M48061 Spinal stenosis, lumbar region without neurogenic claudication: Secondary | ICD-10-CM | POA: Diagnosis not present

## 2018-02-22 DIAGNOSIS — L97811 Non-pressure chronic ulcer of other part of right lower leg limited to breakdown of skin: Secondary | ICD-10-CM | POA: Diagnosis not present

## 2018-02-22 DIAGNOSIS — K5730 Diverticulosis of large intestine without perforation or abscess without bleeding: Secondary | ICD-10-CM | POA: Diagnosis not present

## 2018-02-22 DIAGNOSIS — M797 Fibromyalgia: Secondary | ICD-10-CM | POA: Diagnosis not present

## 2018-02-22 DIAGNOSIS — K760 Fatty (change of) liver, not elsewhere classified: Secondary | ICD-10-CM | POA: Diagnosis not present

## 2018-02-22 DIAGNOSIS — L97821 Non-pressure chronic ulcer of other part of left lower leg limited to breakdown of skin: Secondary | ICD-10-CM | POA: Diagnosis not present

## 2018-02-22 DIAGNOSIS — I10 Essential (primary) hypertension: Secondary | ICD-10-CM | POA: Diagnosis not present

## 2018-02-22 DIAGNOSIS — I872 Venous insufficiency (chronic) (peripheral): Secondary | ICD-10-CM | POA: Diagnosis not present

## 2018-02-22 DIAGNOSIS — R7303 Prediabetes: Secondary | ICD-10-CM | POA: Diagnosis not present

## 2018-02-22 DIAGNOSIS — G894 Chronic pain syndrome: Secondary | ICD-10-CM | POA: Diagnosis not present

## 2018-02-23 ENCOUNTER — Telehealth: Payer: Self-pay | Admitting: Internal Medicine

## 2018-02-23 DIAGNOSIS — G8929 Other chronic pain: Secondary | ICD-10-CM | POA: Diagnosis not present

## 2018-02-23 DIAGNOSIS — Z79891 Long term (current) use of opiate analgesic: Secondary | ICD-10-CM | POA: Diagnosis not present

## 2018-02-23 DIAGNOSIS — M545 Low back pain: Secondary | ICD-10-CM | POA: Diagnosis not present

## 2018-02-23 DIAGNOSIS — M25512 Pain in left shoulder: Secondary | ICD-10-CM | POA: Diagnosis not present

## 2018-02-23 DIAGNOSIS — G894 Chronic pain syndrome: Secondary | ICD-10-CM | POA: Diagnosis not present

## 2018-02-23 NOTE — Telephone Encounter (Signed)
Ok to take lasix at this time

## 2018-02-23 NOTE — Telephone Encounter (Signed)
Pt has been informed and expressed understanding.  

## 2018-02-23 NOTE — Telephone Encounter (Signed)
Pt stated that this morning she forgot to take her BP med but she took her BP med after checking her BP at 1.15.   1.15pm 173/89 101bpm  2.30pm 143/75 82bpm  She is concerned about taking the lasix and the bp med. Would like to know if it is ok to take together. She also wanted to know if she should do an afternoon does of BP med but I instructed her not to since technically when she remember to take her pills it would be considered her afternoons dose and we don't want her BP to go lower than what we need. I told her I would double check on that as well to make sure but advised against it for right now. Please advise.

## 2018-02-23 NOTE — Telephone Encounter (Signed)
Copied from [place]. Topic: General - Other >> Feb 23, 2018  3:14 PM Darl Householder, RMA wrote: Reason for CRM: Patient is requesting a call abck from Dr. [name] Reichmann or CMA concerning BP meds

## 2018-02-24 DIAGNOSIS — L97811 Non-pressure chronic ulcer of other part of right lower leg limited to breakdown of skin: Secondary | ICD-10-CM | POA: Diagnosis not present

## 2018-02-24 DIAGNOSIS — I739 Peripheral vascular disease, unspecified: Secondary | ICD-10-CM | POA: Diagnosis not present

## 2018-02-24 DIAGNOSIS — I10 Essential (primary) hypertension: Secondary | ICD-10-CM | POA: Diagnosis not present

## 2018-02-24 DIAGNOSIS — M797 Fibromyalgia: Secondary | ICD-10-CM | POA: Diagnosis not present

## 2018-02-24 DIAGNOSIS — L97821 Non-pressure chronic ulcer of other part of left lower leg limited to breakdown of skin: Secondary | ICD-10-CM | POA: Diagnosis not present

## 2018-02-24 DIAGNOSIS — R7303 Prediabetes: Secondary | ICD-10-CM | POA: Diagnosis not present

## 2018-02-24 DIAGNOSIS — I872 Venous insufficiency (chronic) (peripheral): Secondary | ICD-10-CM | POA: Diagnosis not present

## 2018-02-24 DIAGNOSIS — M48061 Spinal stenosis, lumbar region without neurogenic claudication: Secondary | ICD-10-CM | POA: Diagnosis not present

## 2018-02-24 DIAGNOSIS — G894 Chronic pain syndrome: Secondary | ICD-10-CM | POA: Diagnosis not present

## 2018-02-24 DIAGNOSIS — K5730 Diverticulosis of large intestine without perforation or abscess without bleeding: Secondary | ICD-10-CM | POA: Diagnosis not present

## 2018-02-24 DIAGNOSIS — K760 Fatty (change of) liver, not elsewhere classified: Secondary | ICD-10-CM | POA: Diagnosis not present

## 2018-02-25 DIAGNOSIS — L97811 Non-pressure chronic ulcer of other part of right lower leg limited to breakdown of skin: Secondary | ICD-10-CM | POA: Diagnosis not present

## 2018-02-25 DIAGNOSIS — I739 Peripheral vascular disease, unspecified: Secondary | ICD-10-CM | POA: Diagnosis not present

## 2018-02-25 DIAGNOSIS — K5730 Diverticulosis of large intestine without perforation or abscess without bleeding: Secondary | ICD-10-CM | POA: Diagnosis not present

## 2018-02-25 DIAGNOSIS — M797 Fibromyalgia: Secondary | ICD-10-CM | POA: Diagnosis not present

## 2018-02-25 DIAGNOSIS — G894 Chronic pain syndrome: Secondary | ICD-10-CM | POA: Diagnosis not present

## 2018-02-25 DIAGNOSIS — F419 Anxiety disorder, unspecified: Secondary | ICD-10-CM | POA: Diagnosis not present

## 2018-02-25 DIAGNOSIS — I10 Essential (primary) hypertension: Secondary | ICD-10-CM | POA: Diagnosis not present

## 2018-02-25 DIAGNOSIS — F321 Major depressive disorder, single episode, moderate: Secondary | ICD-10-CM | POA: Diagnosis not present

## 2018-02-25 DIAGNOSIS — M47816 Spondylosis without myelopathy or radiculopathy, lumbar region: Secondary | ICD-10-CM

## 2018-02-25 DIAGNOSIS — Z87891 Personal history of nicotine dependence: Secondary | ICD-10-CM

## 2018-02-25 DIAGNOSIS — M172 Bilateral post-traumatic osteoarthritis of knee: Secondary | ICD-10-CM

## 2018-02-25 DIAGNOSIS — L97821 Non-pressure chronic ulcer of other part of left lower leg limited to breakdown of skin: Secondary | ICD-10-CM | POA: Diagnosis not present

## 2018-02-25 DIAGNOSIS — M48061 Spinal stenosis, lumbar region without neurogenic claudication: Secondary | ICD-10-CM | POA: Diagnosis not present

## 2018-02-25 DIAGNOSIS — R7303 Prediabetes: Secondary | ICD-10-CM | POA: Diagnosis not present

## 2018-02-25 DIAGNOSIS — Z9181 History of falling: Secondary | ICD-10-CM

## 2018-02-25 DIAGNOSIS — K760 Fatty (change of) liver, not elsewhere classified: Secondary | ICD-10-CM | POA: Diagnosis not present

## 2018-02-25 DIAGNOSIS — I872 Venous insufficiency (chronic) (peripheral): Secondary | ICD-10-CM | POA: Diagnosis not present

## 2018-02-25 DIAGNOSIS — Z6841 Body Mass Index (BMI) 40.0 and over, adult: Secondary | ICD-10-CM

## 2018-02-28 DIAGNOSIS — R7303 Prediabetes: Secondary | ICD-10-CM | POA: Diagnosis not present

## 2018-02-28 DIAGNOSIS — K760 Fatty (change of) liver, not elsewhere classified: Secondary | ICD-10-CM | POA: Diagnosis not present

## 2018-02-28 DIAGNOSIS — L97811 Non-pressure chronic ulcer of other part of right lower leg limited to breakdown of skin: Secondary | ICD-10-CM | POA: Diagnosis not present

## 2018-02-28 DIAGNOSIS — M48061 Spinal stenosis, lumbar region without neurogenic claudication: Secondary | ICD-10-CM | POA: Diagnosis not present

## 2018-02-28 DIAGNOSIS — G894 Chronic pain syndrome: Secondary | ICD-10-CM | POA: Diagnosis not present

## 2018-02-28 DIAGNOSIS — K5730 Diverticulosis of large intestine without perforation or abscess without bleeding: Secondary | ICD-10-CM | POA: Diagnosis not present

## 2018-02-28 DIAGNOSIS — I872 Venous insufficiency (chronic) (peripheral): Secondary | ICD-10-CM | POA: Diagnosis not present

## 2018-02-28 DIAGNOSIS — L97821 Non-pressure chronic ulcer of other part of left lower leg limited to breakdown of skin: Secondary | ICD-10-CM | POA: Diagnosis not present

## 2018-02-28 DIAGNOSIS — I10 Essential (primary) hypertension: Secondary | ICD-10-CM | POA: Diagnosis not present

## 2018-02-28 DIAGNOSIS — I739 Peripheral vascular disease, unspecified: Secondary | ICD-10-CM | POA: Diagnosis not present

## 2018-02-28 DIAGNOSIS — M797 Fibromyalgia: Secondary | ICD-10-CM | POA: Diagnosis not present

## 2018-03-02 DIAGNOSIS — K219 Gastro-esophageal reflux disease without esophagitis: Secondary | ICD-10-CM | POA: Diagnosis not present

## 2018-03-03 DIAGNOSIS — G894 Chronic pain syndrome: Secondary | ICD-10-CM | POA: Diagnosis not present

## 2018-03-03 DIAGNOSIS — I872 Venous insufficiency (chronic) (peripheral): Secondary | ICD-10-CM | POA: Diagnosis not present

## 2018-03-03 DIAGNOSIS — R7303 Prediabetes: Secondary | ICD-10-CM | POA: Diagnosis not present

## 2018-03-03 DIAGNOSIS — I10 Essential (primary) hypertension: Secondary | ICD-10-CM | POA: Diagnosis not present

## 2018-03-03 DIAGNOSIS — K760 Fatty (change of) liver, not elsewhere classified: Secondary | ICD-10-CM | POA: Diagnosis not present

## 2018-03-03 DIAGNOSIS — L97821 Non-pressure chronic ulcer of other part of left lower leg limited to breakdown of skin: Secondary | ICD-10-CM | POA: Diagnosis not present

## 2018-03-03 DIAGNOSIS — M797 Fibromyalgia: Secondary | ICD-10-CM | POA: Diagnosis not present

## 2018-03-03 DIAGNOSIS — K5730 Diverticulosis of large intestine without perforation or abscess without bleeding: Secondary | ICD-10-CM | POA: Diagnosis not present

## 2018-03-03 DIAGNOSIS — I739 Peripheral vascular disease, unspecified: Secondary | ICD-10-CM | POA: Diagnosis not present

## 2018-03-03 DIAGNOSIS — L97811 Non-pressure chronic ulcer of other part of right lower leg limited to breakdown of skin: Secondary | ICD-10-CM | POA: Diagnosis not present

## 2018-03-03 DIAGNOSIS — M48061 Spinal stenosis, lumbar region without neurogenic claudication: Secondary | ICD-10-CM | POA: Diagnosis not present

## 2018-03-04 ENCOUNTER — Ambulatory Visit: Payer: Self-pay | Admitting: *Deleted

## 2018-03-04 ENCOUNTER — Telehealth: Payer: Self-pay | Admitting: Internal Medicine

## 2018-03-04 DIAGNOSIS — R7303 Prediabetes: Secondary | ICD-10-CM | POA: Diagnosis not present

## 2018-03-04 DIAGNOSIS — K760 Fatty (change of) liver, not elsewhere classified: Secondary | ICD-10-CM | POA: Diagnosis not present

## 2018-03-04 DIAGNOSIS — L97821 Non-pressure chronic ulcer of other part of left lower leg limited to breakdown of skin: Secondary | ICD-10-CM | POA: Diagnosis not present

## 2018-03-04 DIAGNOSIS — I10 Essential (primary) hypertension: Secondary | ICD-10-CM | POA: Diagnosis not present

## 2018-03-04 DIAGNOSIS — I872 Venous insufficiency (chronic) (peripheral): Secondary | ICD-10-CM | POA: Diagnosis not present

## 2018-03-04 DIAGNOSIS — I739 Peripheral vascular disease, unspecified: Secondary | ICD-10-CM | POA: Diagnosis not present

## 2018-03-04 DIAGNOSIS — G894 Chronic pain syndrome: Secondary | ICD-10-CM | POA: Diagnosis not present

## 2018-03-04 DIAGNOSIS — M797 Fibromyalgia: Secondary | ICD-10-CM | POA: Diagnosis not present

## 2018-03-04 DIAGNOSIS — M48061 Spinal stenosis, lumbar region without neurogenic claudication: Secondary | ICD-10-CM | POA: Diagnosis not present

## 2018-03-04 DIAGNOSIS — L97811 Non-pressure chronic ulcer of other part of right lower leg limited to breakdown of skin: Secondary | ICD-10-CM | POA: Diagnosis not present

## 2018-03-04 DIAGNOSIS — K5730 Diverticulosis of large intestine without perforation or abscess without bleeding: Secondary | ICD-10-CM | POA: Diagnosis not present

## 2018-03-04 NOTE — Telephone Encounter (Signed)
Copied from [place] [phone]. Topic: Quick Communication - See Telephone Encounter >> Mar 04, 2018 12:43 PM Neva Seat wrote: Maria [name] [facility] [phone]  Today pt has a 4 pound increase from 356 to 360 swelling in legs and left hand.

## 2018-03-04 NOTE — Telephone Encounter (Signed)
Pt's HH RN 'Benjamine Mola' called to report pt has had a 4 lb. weight gain overnight, scale is agency's, accurate. RN reports lung sounds clear, VSS, edema is non-pitting; pt has una boots on. Reports "hands are a little bit swollen."  Pt is on lasix 20mg  QD.  Please advise:  RN will be at home for a few minutes: [phone] Pt's [phone]

## 2018-03-04 NOTE — Telephone Encounter (Signed)
Called Maria [name] w/MD response, but also called patient to see if she was having SOB sxs. Pt states she is not having any SOB sxs, but still have some swelling. Offer if she can come to Sat clinic tomorrow morning. Pt states she can not because w/her transportation she has to give a 3 day notice. The OT Melina Copa) was there w/patient. She states she is gojng to call the office to have a nurse to come out tomorrow to check sxs, but did advise pt if she start having any SOB sxs to go to ED...[name]

## 2018-03-04 NOTE — Telephone Encounter (Signed)
MD is out until next week pls advise.Marland KitchenJohny Chess

## 2018-03-04 NOTE — Telephone Encounter (Signed)
Needs an appt tomorrow to be evaluated unless having shortness of breath or other concerning symptoms - may need to go to ED

## 2018-03-07 ENCOUNTER — Telehealth: Payer: Self-pay | Admitting: Internal Medicine

## 2018-03-07 DIAGNOSIS — K760 Fatty (change of) liver, not elsewhere classified: Secondary | ICD-10-CM | POA: Diagnosis not present

## 2018-03-07 DIAGNOSIS — R7303 Prediabetes: Secondary | ICD-10-CM | POA: Diagnosis not present

## 2018-03-07 DIAGNOSIS — I10 Essential (primary) hypertension: Secondary | ICD-10-CM | POA: Diagnosis not present

## 2018-03-07 DIAGNOSIS — L97821 Non-pressure chronic ulcer of other part of left lower leg limited to breakdown of skin: Secondary | ICD-10-CM | POA: Diagnosis not present

## 2018-03-07 DIAGNOSIS — K5730 Diverticulosis of large intestine without perforation or abscess without bleeding: Secondary | ICD-10-CM | POA: Diagnosis not present

## 2018-03-07 DIAGNOSIS — G894 Chronic pain syndrome: Secondary | ICD-10-CM | POA: Diagnosis not present

## 2018-03-07 DIAGNOSIS — I739 Peripheral vascular disease, unspecified: Secondary | ICD-10-CM | POA: Diagnosis not present

## 2018-03-07 DIAGNOSIS — L97811 Non-pressure chronic ulcer of other part of right lower leg limited to breakdown of skin: Secondary | ICD-10-CM | POA: Diagnosis not present

## 2018-03-07 DIAGNOSIS — I872 Venous insufficiency (chronic) (peripheral): Secondary | ICD-10-CM | POA: Diagnosis not present

## 2018-03-07 DIAGNOSIS — M48061 Spinal stenosis, lumbar region without neurogenic claudication: Secondary | ICD-10-CM | POA: Diagnosis not present

## 2018-03-07 DIAGNOSIS — M797 Fibromyalgia: Secondary | ICD-10-CM | POA: Diagnosis not present

## 2018-03-07 NOTE — Telephone Encounter (Signed)
Please triage and see if pt should come in for an appt.

## 2018-03-07 NOTE — Telephone Encounter (Signed)
Increase lasix to BID

## 2018-03-07 NOTE — Telephone Encounter (Signed)
Advised patient of dr Ronnald Ramp note/instructions---patient repeated back for understanding---called and left message for elizabeth/HH nurse---can talk with Shaft Corigliano,RN if any further questions

## 2018-03-07 NOTE — Telephone Encounter (Signed)
Routing to dr Ronnald Ramp, please advise in the absence of dr john---do you want to increase lasix or should the patient be seen?  Please advise, I will call patient/HH nurse back, thanks

## 2018-03-07 NOTE — Telephone Encounter (Signed)
Copied from [place] [phone]. Topic: Quick Communication - See Telephone Encounter >> Mar 07, 2018  2:27 PM Ether Griffins B wrote: CRM for notification. See Telephone encounter for:  Maria [name] with [facility] calling needing an order for 3-1 commode and insurance information faxed to [facility] fax number [phone].  03/07/18.

## 2018-03-08 ENCOUNTER — Other Ambulatory Visit: Payer: Self-pay

## 2018-03-08 ENCOUNTER — Emergency Department (HOSPITAL_COMMUNITY)
Admission: EM | Admit: 2018-03-08 | Discharge: 2018-03-09 | Disposition: A | Discharge status: Home / Self Care | Payer: Medicare Other | Attending: Emergency Medicine | Admitting: Emergency Medicine

## 2018-03-08 ENCOUNTER — Ambulatory Visit: Payer: Self-pay

## 2018-03-08 ENCOUNTER — Emergency Department (HOSPITAL_COMMUNITY): Payer: Medicare Other

## 2018-03-08 DIAGNOSIS — G894 Chronic pain syndrome: Secondary | ICD-10-CM | POA: Diagnosis not present

## 2018-03-08 DIAGNOSIS — I10 Essential (primary) hypertension: Secondary | ICD-10-CM | POA: Diagnosis not present

## 2018-03-08 DIAGNOSIS — Z87891 Personal history of nicotine dependence: Secondary | ICD-10-CM | POA: Insufficient documentation

## 2018-03-08 DIAGNOSIS — I872 Venous insufficiency (chronic) (peripheral): Secondary | ICD-10-CM | POA: Diagnosis not present

## 2018-03-08 DIAGNOSIS — L97821 Non-pressure chronic ulcer of other part of left lower leg limited to breakdown of skin: Secondary | ICD-10-CM | POA: Diagnosis not present

## 2018-03-08 DIAGNOSIS — R0602 Shortness of breath: Secondary | ICD-10-CM | POA: Insufficient documentation

## 2018-03-08 DIAGNOSIS — M797 Fibromyalgia: Secondary | ICD-10-CM | POA: Diagnosis not present

## 2018-03-08 DIAGNOSIS — Z8614 Personal history of Methicillin resistant Staphylococcus aureus infection: Secondary | ICD-10-CM | POA: Insufficient documentation

## 2018-03-08 DIAGNOSIS — M47816 Spondylosis without myelopathy or radiculopathy, lumbar region: Secondary | ICD-10-CM | POA: Diagnosis not present

## 2018-03-08 DIAGNOSIS — I739 Peripheral vascular disease, unspecified: Secondary | ICD-10-CM | POA: Diagnosis not present

## 2018-03-08 DIAGNOSIS — R600 Localized edema: Secondary | ICD-10-CM | POA: Diagnosis not present

## 2018-03-08 DIAGNOSIS — Z79899 Other long term (current) drug therapy: Secondary | ICD-10-CM | POA: Insufficient documentation

## 2018-03-08 DIAGNOSIS — K760 Fatty (change of) liver, not elsewhere classified: Secondary | ICD-10-CM | POA: Diagnosis not present

## 2018-03-08 DIAGNOSIS — R7303 Prediabetes: Secondary | ICD-10-CM | POA: Diagnosis not present

## 2018-03-08 DIAGNOSIS — M172 Bilateral post-traumatic osteoarthritis of knee: Secondary | ICD-10-CM | POA: Diagnosis not present

## 2018-03-08 DIAGNOSIS — L97811 Non-pressure chronic ulcer of other part of right lower leg limited to breakdown of skin: Secondary | ICD-10-CM | POA: Diagnosis not present

## 2018-03-08 DIAGNOSIS — M48061 Spinal stenosis, lumbar region without neurogenic claudication: Secondary | ICD-10-CM | POA: Diagnosis not present

## 2018-03-08 DIAGNOSIS — Z9181 History of falling: Secondary | ICD-10-CM | POA: Diagnosis not present

## 2018-03-08 DIAGNOSIS — K5730 Diverticulosis of large intestine without perforation or abscess without bleeding: Secondary | ICD-10-CM | POA: Diagnosis not present

## 2018-03-08 DIAGNOSIS — Z79891 Long term (current) use of opiate analgesic: Secondary | ICD-10-CM | POA: Diagnosis not present

## 2018-03-08 LAB — CBC
HEMATOCRIT: 38.8 % (ref 36.0–46.0)
Hemoglobin: 12.8 g/dL (ref 12.0–15.0)
MCH: 29.7 pg (ref 26.0–34.0)
MCHC: 33 g/dL (ref 30.0–36.0)
MCV: 90 fL (ref 78.0–100.0)
PLATELETS: 251 10*3/uL (ref 150–400)
RBC: 4.31 MIL/uL (ref 3.87–5.11)
RDW: 13 % (ref 11.5–15.5)
WBC: 8.8 10*3/uL (ref 4.0–10.5)

## 2018-03-08 LAB — BASIC METABOLIC PANEL
Anion gap: 11 (ref 5–15)
BUN: 9 mg/dL (ref 6–20)
CALCIUM: 8.8 mg/dL — AB (ref 8.9–10.3)
CO2: 29 mmol/L (ref 22–32)
Chloride: 101 mmol/L (ref 101–111)
Creatinine, Ser: 0.82 mg/dL (ref 0.44–1.00)
GFR calc Af Amer: >60 mL/min (ref >60)
GLUCOSE: 94 mg/dL (ref 65–99)
POTASSIUM: 3.4 mmol/L — AB (ref 3.5–5.1)
Sodium: 141 mmol/L (ref 135–145)

## 2018-03-08 LAB — URINALYSIS, ROUTINE W REFLEX MICROSCOPIC
Bilirubin Urine: NEGATIVE
GLUCOSE, UA: NEGATIVE mg/dL
Ketones, ur: NEGATIVE mg/dL
Nitrite: NEGATIVE
PROTEIN: NEGATIVE mg/dL
Specific Gravity, Urine: 1.005 (ref 1.005–1.030)
pH: 6 (ref 5.0–8.0)

## 2018-03-08 LAB — BRAIN NATRIURETIC PEPTIDE: B Natriuretic Peptide: 16.2 pg/mL (ref 0.0–100.0)

## 2018-03-08 LAB — I-STAT TROPONIN, ED: TROPONIN I, POC: 0 ng/mL (ref 0.00–0.08)

## 2018-03-08 MED ORDER — FUROSEMIDE 10 MG/ML IJ SOLN
40.0000 mg | Freq: Once | INTRAMUSCULAR | Status: AC
  Administered 2018-03-09: 40 mg via INTRAVENOUS
  Filled 2018-03-08: qty 4

## 2018-03-08 MED ORDER — TORSEMIDE 10 MG PO TABS: 20.0000 mg | ORAL_TABLET | Freq: Every day | ORAL | 0 refills | Status: DC

## 2018-03-08 MED ORDER — FENTANYL CITRATE (PF) 100 MCG/2ML IJ SOLN
25.0000 ug | Freq: Once | INTRAMUSCULAR | Status: AC
  Administered 2018-03-08: 25 ug via INTRAVENOUS
  Filled 2018-03-08: qty 2

## 2018-03-08 NOTE — Telephone Encounter (Signed)
Elmyra Ricks PT- assistant from [facility] calling to report that pt has gained 0.5 lb overnight despite increased dose of Lasix that was rx for her yesterday. Pt has edema to bilateral lower legs and both hands. She states it feels like a burning pain. Per Elmyra Ricks, pt is having more SOB than baseline but her O2 sat and VSS. Elmyra Ricks was asking if needs to take another dose of Lasix or come in for labwork. Called PCP office to discuss with Jonelle Sidle. Dr. Ronnald Ramp gave pt 2 options: go to ED for lab work and IV diuresis or to call in Demedex to see it will help. Pt stated she was ok with Demedex. Jonelle Sidle stated that Dr. Ronnald Ramp wants to get lab work on pt prior to Demedex. Pt has no transportation and refused ambulance to bring to office. Per Jonelle Sidle, Dr. Ronnald Ramp stated he wants pt to call ambulance to go to ED for labs and IV diuresis. Elmyra Ricks notified pt. Reason for Disposition . Difficulty breathing at rest  Answer Assessment - Initial Assessment Questions 1. ONSET: "When did the swelling start?" (e.g., minutes, hours, days)    ongoing 2. LOCATION: "What part of the leg is swollen?"  "Are both legs swollen or just one leg?" Both legs 3. SEVERITY: "How bad is the swelling?" (e.g., localized; mild, moderate, severe)  - Localized - small area of swelling localized to one leg  - MILD pedal edema - swelling limited to foot and ankle, pitting edema < 1/4 inch (6 mm) deep, rest and elevation eliminate most or all swelling  - MODERATE edema - swelling of lower leg to knee, pitting edema > 1/4 inch (6 mm) deep, rest and elevation only partially reduce swelling  - SEVERE edema - swelling extends above knee, facial or hand swelling present  Moderate- pt was given extra dose of Lasix yesterday but has gained 0.5 lb 4. REDNESS: "Does the swelling look red or infected?"     no 5. PAIN: "Is the swelling painful to touch?" If so, ask: "How painful is it?"   (Scale 1-10; mild, moderate or severe) Burning pain 6. FEVER: "Do you have a  fever?" If so, ask: "What is it, how was it measured, and when did it start?"  no 7. CAUSE: "What do you think is causing the leg swelling?"     PVD 8. MEDICAL HISTORY: "Do you have a history of heart failure, kidney disease, liver failure, or cancer?" no 9. RECURRENT SYMPTOM: "Have you had leg swelling before?" If so, ask: "When was the last time?" "What happened that time?" Yes given extra lasix and seen in ED for anasarca 10. OTHER SYMPTOMS: "Do you have any other symptoms?" (e.g., chest pain, difficulty breathing) SOB, hand edema 11. PREGNANCY: "Is there any chance you are pregnant?" "When was your last menstrual period?" n/a  Protocols used: LEG SWELLING AND EDEMA-A-AH

## 2018-03-08 NOTE — Discharge Instructions (Signed)
Start taking Demadex once a day.  Stop taking your furosemide.  Follow-up with your primary care doctor for further evaluation and management. You should call tomorrow to confirm you are on the correct dose.  Return to the emergency room if you develop chest pain, persistent shortness of breath, difficulty breathing, or any new or concerning symptoms.

## 2018-03-08 NOTE — Telephone Encounter (Signed)
Done hardcopy to shirron 

## 2018-03-08 NOTE — ED Triage Notes (Signed)
Pt c/o hypertension, bilateral leg pain, SOB on exertion, for past 6 weeks. Pt states she took lasix yesterday but didn't urinate much. Pt states her Bps were 160's over 90s yesterday and today. Pt does not have high BP usually.

## 2018-03-08 NOTE — ED Provider Notes (Signed)
[facility] DEPT Provider Note   CSN: [id number] Arrival date & time: 03/08/18  1522     History   Chief Complaint Chief Complaint  Patient presents with  . Edema    Bilateral legs  . Hypertension    HPI Maria [name] is a 62 y.o. female presenting for evaluation of bilateral leg swelling.  Patient states that she has been struggling with bilateral leg swelling for the past several months.  She has been evaluated multiple times in the ER and at her primary care's office.  She has a recent weight gain over the past day, despite doubling her Lasix yesterday.  She reports decreased urinary output yesterday, and states he urine was cloudy.   Denies dysuria or hematuria.  She reports increased pain in bilateral legs and abdomen, and a tightness all over her body.  She has shortness of breath on exertion, no shortness of breath at rest.  No chest pain.  She has been diagnosed with lymphedema, and has bilateral unna boots.  She denies fevers, chills, cough, nausea, vomiting, abnormal bowel movements.  Her primary care doctor was concerned about her increased weight gain, and recommended she comes to the emergency room for labs and IV diuresis.  HPI  Past Medical History:  Diagnosis Date  . Acute lymphadenitis 2011  . ALLERGIC RHINITIS 08/10/2007  . ANXIETY 12/06/2007  . Atherosclerotic peripheral vascular disease ([place]) 06/13/2013   Aorta on CT June 2014  . BACK PAIN 12/06/2007  . Cellulitis 2011  . Cervical disc disease 03/09/2012  . Chronic pain 03/09/2012  . DEPRESSION 12/06/2007  . DIVERTICULOSIS, [place] 12/06/2007  . GERD 12/06/2007  . Hepatitis    age 56 hepatitis A  . HYPERTENSION 12/06/2007  . Impaired glucose tolerance 03/13/2012  . LOW BACK PAIN 12/06/2007  . Lumbar disc disease 03/09/2012  . Mesenteric adenitis   . MRSA 2006  . Pain    by breast area  . Pain in joint, multiple sites 04/21/2010  . PARONYCHIA, FINGER 08/13/2009  . Pneumonia  sept 2015   walking pneumonia, sepsis   . RASH-NONVESICULAR 05/09/2008  . 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 SHOULDER PAIN, LEFT 05/09/2008  . SINUSITIS- ACUTE-NOS 05/09/2008  . THORACIC/LUMBOSACRAL NEURITIS/RADICULITIS UNSPEC 12/28/2008    Patient Active Problem List   Diagnosis Date Noted  . Acquired lymphedema 01/25/2018  . Gait disorder 01/25/2018  . Peripheral edema 01/16/2018  . SOB (shortness of breath) 01/15/2018  . 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 Preventative health care 11/08/2017  . Acute upper respiratory infection 11/08/2017  . Abdominal pain, epigastric   . Dysphagia   . [place] cancer screening   . Adverse effect of angiotensin-converting enzyme inhibitor 12/05/2015  . Restless leg 10/08/2015  . Dermatitis, stasis 04/26/2015  . Fibromyalgia 01/31/2015  . Acid reflux 10/17/2014  . 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 Post-traumatic osteoarthritis of both knees 09/19/2014  . Spinal stenosis 09/19/2014  . Acute respiratory failure with hypoxia ([place]) 08/20/2014  . Sepsis ([place]) 08/19/2014  . CAP (community acquired pneumonia) 08/19/2014  . Cervical disc disease 03/09/2012  . Lumbar disc disease 03/09/2012  . Chronic pain 03/09/2012  . Obesity 06/17/2011  . THORACIC/LUMBOSACRAL NEURITIS/RADICULITIS UNSPEC 12/28/2008  . Anxiety state 12/06/2007  . Depression 12/06/2007  . Essential hypertension 12/06/2007  . LOW BACK PAIN 12/06/2007    Past Surgical History:  Procedure Laterality Date  . ABDOMINAL HYSTERECTOMY  1999   1 ovary left  . bloo clot removed from neck  may 25th , june 2. 2010  . [place] SURGERY  may 24th 2010  . COLONOSCOPY WITH PROPOFOL N/A 07/10/2016   Procedure: COLONOSCOPY WITH PROPOFOL;  Surgeon: Manus Gunning, MD;  Location: [facility];  Service: Gastroenterology;  Laterality: N/A;  . ESOPHAGOGASTRODUODENOSCOPY (EGD) WITH PROPOFOL N/A 07/10/2016   Procedure: ESOPHAGOGASTRODUODENOSCOPY (EGD) WITH  PROPOFOL;  Surgeon: Manus Gunning, MD;  Location: [facility];  Service: Gastroenterology;  Laterality: N/A;  . s/p ovary cyst    . s/p right knee arthroscopy     Dr. Mardelle Matte ortho    OB History    No data available       Home Medications    Prior to Admission medications   Medication Sig Start Date End Date Taking? Authorizing Provider  clonazePAM (KLONOPIN) 1 MG tablet Take 1 tablet (1 mg total) by mouth 2 (two) times daily as needed for anxiety. 01/28/18  Yes Biagio Borg, MD  cyclobenzaprine (FLEXERIL) 10 MG tablet Take 1 tablet (10 mg total) daily as needed by mouth for muscle spasms. 11/08/17  Yes Biagio Borg, MD  dicyclomine (BENTYL) 10 MG capsule Take 1 capsule (10 mg total) 4 (four) times daily -  before meals and at bedtime by mouth. Patient taking differently: Take 10 mg by mouth 3 (three) times daily as needed for spasms.  11/08/17  Yes Biagio Borg, MD  furosemide (LASIX) 20 MG tablet Take 1 tablet (20 mg total) daily as needed by mouth for fluid. 11/08/17  Yes Biagio Borg, MD  gabapentin (NEURONTIN) 300 MG capsule Take 1 capsule (300 mg total) 3 (three) times daily by mouth. 11/08/17  Yes Biagio Borg, MD  losartan (COZAAR) 50 MG tablet Take 50 mg by mouth daily.  09/29/17  Yes [provider]  Oxycodone HCl 10 MG TABS Take 1 tablet (10 mg total) 4 (four) times daily by mouth. Per [facility] 11/08/17  Yes Biagio Borg, MD  pantoprazole (PROTONIX) 40 MG tablet Take 1 tablet (40 mg total) daily by mouth. 11/08/17  Yes Biagio Borg, MD  sertraline (ZOLOFT) 100 MG tablet TAKE 1 TABLET BY MOUTH EVERY DAY 01/19/18  Yes Biagio Borg, MD  torsemide (DEMADEX) 10 MG tablet Take 2 tablets (20 mg total) by mouth daily. 03/08/18   Shantal Roan, PA-C  triamcinolone cream (KENALOG) 0.1 % Apply 1 application 2 (two) times daily as needed topically (rash). 11/08/17   Biagio Borg, MD  DULoxetine (CYMBALTA) 60 MG capsule Take 60 mg by mouth daily.     03/09/12  [provider]  fexofenadine (ALLEGRA) 180 MG tablet Take 180 mg by mouth daily.    03/09/12  [provider]    Family History Family History  Problem Relation Age of Onset  . Heart disease Mother   . Hypertension Mother   . Diabetes Mother   . Heart failure Mother   . Asthma Sister   . Hypertension Father   . Asthma Daughter   . Cancer Maternal Uncle        [place]  . Cancer Other        ovarian  . Liver cancer Paternal Grandmother        ????    Social History Social History   Tobacco Use  . Smoking status: Former Smoker    Years: 30.00    Last attempt to quit: 11/08/2008    Years since quitting: 9.3  . Smokeless tobacco: Never Used  . Tobacco  comment: quit 10/09  Substance Use Topics  . Alcohol use: No  . Drug use: No     Allergies   Adhesive [tape]; Amoxicillin-pot clavulanate; Codeine; Lactose intolerance (gi); Morphine and related; Vicodin [hydrocodone-acetaminophen]; and Latex   Review of Systems Review of Systems  Constitutional: Negative for chills and fever.  Respiratory: Positive for shortness of breath (on exertion). Negative for cough and chest tightness.   Cardiovascular: Positive for leg swelling. Negative for chest pain.  Gastrointestinal: Positive for abdominal distention (due to fluid). Negative for nausea and vomiting.  Genitourinary: Positive for decreased urine volume. Negative for dysuria and hematuria.  All other systems reviewed and are negative.    Physical Exam Updated Vital Signs BP (!) 142/65   Pulse 83   Temp 98.5 F (36.9 C) (Oral)   Resp 17   Ht 5\' 5"  (1.651 m)   Wt (!) 162.8 kg (359 lb)   SpO2 100%   BMI 59.74 kg/m   Physical Exam  Constitutional: She is oriented to person, place, and time. She appears well-developed and well-nourished.  HENT:  Head: Normocephalic and atraumatic.  Eyes: Conjunctivae and EOM are normal. Pupils are equal, round, and reactive to light.  Neck: Normal range of  motion.  Cardiovascular: Normal rate, regular rhythm and intact distal pulses.  3+ bilateral pitting edema.  Bilateral Unna dressing on. Good pedal pulses. Good cap refill  Pulmonary/Chest: Effort normal and breath sounds normal. No respiratory distress. She has no wheezes.  Abdominal: Soft. She exhibits no distension and no mass. There is no tenderness. There is no guarding.  Obese female with anascara  Musculoskeletal: She exhibits edema.  Neurological: She is alert and oriented to person, place, and time.  Skin: Skin is warm and dry.  Psychiatric: She has a normal mood and affect.  Nursing note and vitals reviewed.    ED Treatments / Results  Labs (all labs ordered are listed, but only abnormal results are displayed) Labs Reviewed  BASIC METABOLIC PANEL - Abnormal; Notable for the following components:      Result Value   Potassium 3.4 (*)    Calcium 8.8 (*)    All other components within normal limits  URINALYSIS, ROUTINE W REFLEX MICROSCOPIC - Abnormal; Notable for the following components:   APPearance HAZY (*)    Hgb urine dipstick SMALL (*)    Leukocytes, UA LARGE (*)    Bacteria, UA FEW (*)    Squamous Epithelial / LPF 6-30 (*)    All other components within normal limits  CBC  BRAIN NATRIURETIC PEPTIDE  I-STAT TROPONIN, ED    EKG  EKG Interpretation  Date/Time:  Tuesday March 08 2018 22:29:15 EDT Ventricular Rate:  82 PR Interval:    QRS Duration: 112 QT Interval:  420 QTC Calculation: 491 R Axis:   71 Text Interpretation:  Sinus rhythm Borderline intraventricular conduction delay Low voltage, precordial leads Borderline prolonged QT interval No acute changes Confirmed by Varney Biles ([id number]) on 03/08/2018 11:09:23 PM       Radiology Dg Chest 2 View  Result Date: 03/08/2018 CLINICAL DATA:  Shortness of breath. EXAM: CHEST - 2 VIEW COMPARISON:  01/15/2018 FINDINGS: The cardiac silhouette is mildly enlarged. No airspace consolidation, edema, or  pneumothorax is identified. No sizable pleural effusion is seen, although the posterior costophrenic angles were incompletely imaged. Prior anterior cervical fusion is noted. IMPRESSION: No active cardiopulmonary disease. Electronically Signed   By: Logan Bores M.D.   On: 03/08/2018 21:26  Procedures Procedures (including critical care time)  Medications Ordered in ED Medications  furosemide (LASIX) injection 40 mg (not administered)  fentaNYL (SUBLIMAZE) injection 25 mcg (25 mcg Intravenous Given 03/08/18 2207)     Initial Impression / Assessment and Plan / ED Course  I have reviewed the triage vital signs and the nursing notes.  Pertinent labs & imaging results that were available during my care of the patient were reviewed by me and considered in my medical decision making (see chart for details).     Patient presenting for evaluation of bilateral leg swelling, worsened recently.  Physical exam shows patient who is in no acute distress, she is afebrile not tachycardic.  Pulmonary exam reassuring, no crackles or rales.  No obvious signs of shortness of breath.  No hypoxia.  She denies chest pain.  Pitting edema of bilateral legs with anascara.  This has been present on the past several evaluations.  Will obtain basic labs, troponin, BNP, urine, chest x-ray, and EKG.  X-ray reviewed and interpreted by me, no sign of infection or fluid overload.  BNP negative.  Troponin negative.  Labs reassuring, no sign of kidney abnormality.  EKG unchanged from prior.  Patient remains a normal cardiac and without hypoxia.  Doubt shortness of breath is due to PE. No pleuritic pain. Case discussed with attending, Dr. Kathrynn Humble evaluated the pt. will give dose of IV Lasix and start on Demadex, per primary care recommendation.  Patient follow-up with primary care.  Discussed findings and plan with patient.  Patient is agreeable to this.  At this time, patient appears safe for discharge.  Return precautions  given.  Patient states she understands and agrees to plan.   Final Clinical Impressions(s) / ED Diagnoses   Final diagnoses:  Bilateral leg edema    ED Discharge Orders        Ordered    torsemide (DEMADEX) 10 MG tablet  Daily     03/08/18 2345       Arbor Cohen, PA-C 03/08/18 [place], Ankit, MD 03/10/18 1530

## 2018-03-09 NOTE — Telephone Encounter (Signed)
Faxed

## 2018-03-10 DIAGNOSIS — K5730 Diverticulosis of large intestine without perforation or abscess without bleeding: Secondary | ICD-10-CM | POA: Diagnosis not present

## 2018-03-10 DIAGNOSIS — L97822 Non-pressure chronic ulcer of other part of left lower leg with fat layer exposed: Secondary | ICD-10-CM | POA: Diagnosis not present

## 2018-03-10 DIAGNOSIS — I10 Essential (primary) hypertension: Secondary | ICD-10-CM | POA: Diagnosis not present

## 2018-03-10 DIAGNOSIS — L97821 Non-pressure chronic ulcer of other part of left lower leg limited to breakdown of skin: Secondary | ICD-10-CM | POA: Diagnosis not present

## 2018-03-10 DIAGNOSIS — I739 Peripheral vascular disease, unspecified: Secondary | ICD-10-CM | POA: Diagnosis not present

## 2018-03-10 DIAGNOSIS — L97811 Non-pressure chronic ulcer of other part of right lower leg limited to breakdown of skin: Secondary | ICD-10-CM | POA: Diagnosis not present

## 2018-03-10 DIAGNOSIS — M797 Fibromyalgia: Secondary | ICD-10-CM | POA: Diagnosis not present

## 2018-03-10 DIAGNOSIS — I872 Venous insufficiency (chronic) (peripheral): Secondary | ICD-10-CM | POA: Diagnosis not present

## 2018-03-10 DIAGNOSIS — R7303 Prediabetes: Secondary | ICD-10-CM | POA: Diagnosis not present

## 2018-03-10 DIAGNOSIS — K760 Fatty (change of) liver, not elsewhere classified: Secondary | ICD-10-CM | POA: Diagnosis not present

## 2018-03-10 DIAGNOSIS — M48061 Spinal stenosis, lumbar region without neurogenic claudication: Secondary | ICD-10-CM | POA: Diagnosis not present

## 2018-03-10 DIAGNOSIS — G894 Chronic pain syndrome: Secondary | ICD-10-CM | POA: Diagnosis not present

## 2018-03-10 DIAGNOSIS — L97812 Non-pressure chronic ulcer of other part of right lower leg with fat layer exposed: Secondary | ICD-10-CM | POA: Diagnosis not present

## 2018-03-11 ENCOUNTER — Telehealth: Payer: Self-pay | Admitting: Internal Medicine

## 2018-03-11 DIAGNOSIS — I739 Peripheral vascular disease, unspecified: Secondary | ICD-10-CM | POA: Diagnosis not present

## 2018-03-11 DIAGNOSIS — K5730 Diverticulosis of large intestine without perforation or abscess without bleeding: Secondary | ICD-10-CM | POA: Diagnosis not present

## 2018-03-11 DIAGNOSIS — M48061 Spinal stenosis, lumbar region without neurogenic claudication: Secondary | ICD-10-CM | POA: Diagnosis not present

## 2018-03-11 DIAGNOSIS — M797 Fibromyalgia: Secondary | ICD-10-CM | POA: Diagnosis not present

## 2018-03-11 DIAGNOSIS — I10 Essential (primary) hypertension: Secondary | ICD-10-CM | POA: Diagnosis not present

## 2018-03-11 DIAGNOSIS — K760 Fatty (change of) liver, not elsewhere classified: Secondary | ICD-10-CM | POA: Diagnosis not present

## 2018-03-11 DIAGNOSIS — R7303 Prediabetes: Secondary | ICD-10-CM | POA: Diagnosis not present

## 2018-03-11 DIAGNOSIS — L97821 Non-pressure chronic ulcer of other part of left lower leg limited to breakdown of skin: Secondary | ICD-10-CM | POA: Diagnosis not present

## 2018-03-11 DIAGNOSIS — L97811 Non-pressure chronic ulcer of other part of right lower leg limited to breakdown of skin: Secondary | ICD-10-CM | POA: Diagnosis not present

## 2018-03-11 DIAGNOSIS — I872 Venous insufficiency (chronic) (peripheral): Secondary | ICD-10-CM | POA: Diagnosis not present

## 2018-03-11 DIAGNOSIS — G894 Chronic pain syndrome: Secondary | ICD-10-CM | POA: Diagnosis not present

## 2018-03-11 NOTE — Telephone Encounter (Signed)
Faxed to [facility]  

## 2018-03-11 NOTE — Telephone Encounter (Signed)
Called [name], LVM with instructions per PCP.

## 2018-03-11 NOTE — Telephone Encounter (Signed)
Please continue to check daily wts, cont same tx for now, and consider ROV early next wk

## 2018-03-11 NOTE — Telephone Encounter (Signed)
Copied from [place] [phone]. Topic: Quick Communication - See Telephone Encounter >> Mar 11, 2018  1:09 PM Lolita Rieger, RMA wrote: CRM for notification. See Telephone encounter for: 03/11/18.Sonia Baller  from [facility] stated that pt having increase in weight due to swelling 3-4 lbs overnight  please call Sonia Baller from [phone]

## 2018-03-11 NOTE — Telephone Encounter (Signed)
Lysbeth Galas, OT w/ [facility] is requesting DME for 3 in 1 camode be faxed to [facility]" instead of [facility] because they have not heard back from [facility]. The fax to [facility] is [phone].

## 2018-03-15 DIAGNOSIS — K5730 Diverticulosis of large intestine without perforation or abscess without bleeding: Secondary | ICD-10-CM | POA: Diagnosis not present

## 2018-03-15 DIAGNOSIS — R7303 Prediabetes: Secondary | ICD-10-CM | POA: Diagnosis not present

## 2018-03-15 DIAGNOSIS — L97811 Non-pressure chronic ulcer of other part of right lower leg limited to breakdown of skin: Secondary | ICD-10-CM | POA: Diagnosis not present

## 2018-03-15 DIAGNOSIS — K760 Fatty (change of) liver, not elsewhere classified: Secondary | ICD-10-CM | POA: Diagnosis not present

## 2018-03-15 DIAGNOSIS — M48061 Spinal stenosis, lumbar region without neurogenic claudication: Secondary | ICD-10-CM | POA: Diagnosis not present

## 2018-03-15 DIAGNOSIS — L97821 Non-pressure chronic ulcer of other part of left lower leg limited to breakdown of skin: Secondary | ICD-10-CM | POA: Diagnosis not present

## 2018-03-15 DIAGNOSIS — I872 Venous insufficiency (chronic) (peripheral): Secondary | ICD-10-CM | POA: Diagnosis not present

## 2018-03-15 DIAGNOSIS — I739 Peripheral vascular disease, unspecified: Secondary | ICD-10-CM | POA: Diagnosis not present

## 2018-03-15 DIAGNOSIS — G894 Chronic pain syndrome: Secondary | ICD-10-CM | POA: Diagnosis not present

## 2018-03-15 DIAGNOSIS — M797 Fibromyalgia: Secondary | ICD-10-CM | POA: Diagnosis not present

## 2018-03-15 DIAGNOSIS — I10 Essential (primary) hypertension: Secondary | ICD-10-CM | POA: Diagnosis not present

## 2018-03-16 DIAGNOSIS — L97811 Non-pressure chronic ulcer of other part of right lower leg limited to breakdown of skin: Secondary | ICD-10-CM | POA: Diagnosis not present

## 2018-03-16 DIAGNOSIS — R7303 Prediabetes: Secondary | ICD-10-CM | POA: Diagnosis not present

## 2018-03-16 DIAGNOSIS — I10 Essential (primary) hypertension: Secondary | ICD-10-CM | POA: Diagnosis not present

## 2018-03-16 DIAGNOSIS — I739 Peripheral vascular disease, unspecified: Secondary | ICD-10-CM | POA: Diagnosis not present

## 2018-03-16 DIAGNOSIS — I872 Venous insufficiency (chronic) (peripheral): Secondary | ICD-10-CM | POA: Diagnosis not present

## 2018-03-16 DIAGNOSIS — G894 Chronic pain syndrome: Secondary | ICD-10-CM | POA: Diagnosis not present

## 2018-03-16 DIAGNOSIS — M797 Fibromyalgia: Secondary | ICD-10-CM | POA: Diagnosis not present

## 2018-03-16 DIAGNOSIS — M48061 Spinal stenosis, lumbar region without neurogenic claudication: Secondary | ICD-10-CM | POA: Diagnosis not present

## 2018-03-16 DIAGNOSIS — K5730 Diverticulosis of large intestine without perforation or abscess without bleeding: Secondary | ICD-10-CM | POA: Diagnosis not present

## 2018-03-16 DIAGNOSIS — K760 Fatty (change of) liver, not elsewhere classified: Secondary | ICD-10-CM | POA: Diagnosis not present

## 2018-03-16 DIAGNOSIS — L97821 Non-pressure chronic ulcer of other part of left lower leg limited to breakdown of skin: Secondary | ICD-10-CM | POA: Diagnosis not present

## 2018-03-17 DIAGNOSIS — K760 Fatty (change of) liver, not elsewhere classified: Secondary | ICD-10-CM | POA: Diagnosis not present

## 2018-03-17 DIAGNOSIS — M797 Fibromyalgia: Secondary | ICD-10-CM | POA: Diagnosis not present

## 2018-03-17 DIAGNOSIS — I739 Peripheral vascular disease, unspecified: Secondary | ICD-10-CM | POA: Diagnosis not present

## 2018-03-17 DIAGNOSIS — G894 Chronic pain syndrome: Secondary | ICD-10-CM | POA: Diagnosis not present

## 2018-03-17 DIAGNOSIS — L97821 Non-pressure chronic ulcer of other part of left lower leg limited to breakdown of skin: Secondary | ICD-10-CM | POA: Diagnosis not present

## 2018-03-17 DIAGNOSIS — L97811 Non-pressure chronic ulcer of other part of right lower leg limited to breakdown of skin: Secondary | ICD-10-CM | POA: Diagnosis not present

## 2018-03-17 DIAGNOSIS — R7303 Prediabetes: Secondary | ICD-10-CM | POA: Diagnosis not present

## 2018-03-17 DIAGNOSIS — K5730 Diverticulosis of large intestine without perforation or abscess without bleeding: Secondary | ICD-10-CM | POA: Diagnosis not present

## 2018-03-17 DIAGNOSIS — I10 Essential (primary) hypertension: Secondary | ICD-10-CM | POA: Diagnosis not present

## 2018-03-17 DIAGNOSIS — I872 Venous insufficiency (chronic) (peripheral): Secondary | ICD-10-CM | POA: Diagnosis not present

## 2018-03-17 DIAGNOSIS — M48061 Spinal stenosis, lumbar region without neurogenic claudication: Secondary | ICD-10-CM | POA: Diagnosis not present

## 2018-03-21 ENCOUNTER — Other Ambulatory Visit (INDEPENDENT_AMBULATORY_CARE_PROVIDER_SITE_OTHER): Payer: Medicare Other

## 2018-03-21 ENCOUNTER — Ambulatory Visit (INDEPENDENT_AMBULATORY_CARE_PROVIDER_SITE_OTHER): Payer: Medicare Other | Admitting: Internal Medicine

## 2018-03-21 ENCOUNTER — Other Ambulatory Visit: Payer: Medicare Other

## 2018-03-21 ENCOUNTER — Encounter: Payer: Self-pay | Admitting: Internal Medicine

## 2018-03-21 VITALS — BP 134/84 | HR 85 | Temp 98.0°F | Ht 65.0 in | Wt 355.0 lb

## 2018-03-21 DIAGNOSIS — R399 Unspecified symptoms and signs involving the genitourinary system: Secondary | ICD-10-CM | POA: Diagnosis not present

## 2018-03-21 DIAGNOSIS — R0902 Hypoxemia: Secondary | ICD-10-CM | POA: Diagnosis not present

## 2018-03-21 DIAGNOSIS — M545 Low back pain: Secondary | ICD-10-CM | POA: Diagnosis not present

## 2018-03-21 DIAGNOSIS — M48061 Spinal stenosis, lumbar region without neurogenic claudication: Secondary | ICD-10-CM | POA: Diagnosis not present

## 2018-03-21 DIAGNOSIS — R400 Somnolence: Secondary | ICD-10-CM

## 2018-03-21 DIAGNOSIS — I890 Lymphedema, not elsewhere classified: Secondary | ICD-10-CM

## 2018-03-21 DIAGNOSIS — M25561 Pain in right knee: Secondary | ICD-10-CM | POA: Diagnosis not present

## 2018-03-21 DIAGNOSIS — L97821 Non-pressure chronic ulcer of other part of left lower leg limited to breakdown of skin: Secondary | ICD-10-CM | POA: Diagnosis not present

## 2018-03-21 DIAGNOSIS — I739 Peripheral vascular disease, unspecified: Secondary | ICD-10-CM | POA: Diagnosis not present

## 2018-03-21 DIAGNOSIS — R0602 Shortness of breath: Secondary | ICD-10-CM

## 2018-03-21 DIAGNOSIS — G894 Chronic pain syndrome: Secondary | ICD-10-CM | POA: Diagnosis not present

## 2018-03-21 DIAGNOSIS — L97811 Non-pressure chronic ulcer of other part of right lower leg limited to breakdown of skin: Secondary | ICD-10-CM | POA: Diagnosis not present

## 2018-03-21 DIAGNOSIS — K760 Fatty (change of) liver, not elsewhere classified: Secondary | ICD-10-CM | POA: Diagnosis not present

## 2018-03-21 DIAGNOSIS — G4733 Obstructive sleep apnea (adult) (pediatric): Secondary | ICD-10-CM | POA: Insufficient documentation

## 2018-03-21 DIAGNOSIS — R7303 Prediabetes: Secondary | ICD-10-CM | POA: Diagnosis not present

## 2018-03-21 DIAGNOSIS — I10 Essential (primary) hypertension: Secondary | ICD-10-CM | POA: Diagnosis not present

## 2018-03-21 DIAGNOSIS — K5730 Diverticulosis of large intestine without perforation or abscess without bleeding: Secondary | ICD-10-CM | POA: Diagnosis not present

## 2018-03-21 DIAGNOSIS — G8929 Other chronic pain: Secondary | ICD-10-CM

## 2018-03-21 DIAGNOSIS — I872 Venous insufficiency (chronic) (peripheral): Secondary | ICD-10-CM | POA: Diagnosis not present

## 2018-03-21 DIAGNOSIS — M797 Fibromyalgia: Secondary | ICD-10-CM | POA: Diagnosis not present

## 2018-03-21 LAB — URINALYSIS, ROUTINE W REFLEX MICROSCOPIC
Bilirubin Urine: NEGATIVE
Hgb urine dipstick: NEGATIVE
Ketones, ur: NEGATIVE
LEUKOCYTES UA: NEGATIVE
NITRITE: NEGATIVE
PH: 7 (ref 5.0–8.0)
RBC / HPF: NONE SEEN (ref 0–?)
SPECIFIC GRAVITY, URINE: 1.01 (ref 1.000–1.030)
Total Protein, Urine: NEGATIVE
URINE GLUCOSE: NEGATIVE
UROBILINOGEN UA: 0.2 (ref 0.0–1.0)

## 2018-03-21 MED ORDER — POTASSIUM CHLORIDE ER 10 MEQ PO TBCR: 10.0000 meq | EXTENDED_RELEASE_TABLET | Freq: Every day | ORAL | 3 refills | Status: DC

## 2018-03-21 MED ORDER — FUROSEMIDE 40 MG PO TABS: 40.0000 mg | ORAL_TABLET | Freq: Every day | ORAL | 3 refills | Status: DC

## 2018-03-21 MED ORDER — ONDANSETRON HCL 4 MG PO TABS: 4.0000 mg | ORAL_TABLET | Freq: Three times a day (TID) | ORAL | 0 refills | Status: DC | PRN

## 2018-03-21 NOTE — Progress Notes (Signed)
Subjective:    Patient ID: Maria [name], female    DOB: 02-08-1956, 62 y.o.   MRN: [id number]  HPI  Here after seen in ED with CC of bilat leg swelling in the setting of hx of venous insufficiency/lymphedema    BNP was normal, did not o/w appear volume overloaded by exam, and pt reassured and allowed to f/u here.  Today seems to still have swelling that goes up and down depending on position.  Also with ongoing pain to right knee with swelling in addition, is s/p right knee surgury in 2011 with Dr Fredrich Romans, has also had cortisone since then, and last seen about 2012 per pt with ortho.  Also Pt continues to have recurring right LBP without change in severity, bowel or bladder change, fever, wt loss,  worsening LE pain/numbness/weakness, though has gait change but no falls.  Walks with cane. Has unna boots in place.  Conts on SSI disability for chronic back and knee pain. Also with getting PT she was noted some o2 desaturations to mid to higher 80's with ambulation at home, pt states can walk about 100 ft at least but after is too winded to continue.  Recent cxr 3/19 showed No active disease.  There was some discussion in ED regarding differential including PE, D dimer and CTA chest not pursued.  Had recent echo 01/2018 with normal LV EF, mild MR.   Has lost 14 lbs recently but seems to go up and down as well, but overall down.  Good med compliance. Wt Readings from Last 3 Encounters:  03/21/18 (!) 355 lb (161 kg)  03/08/18 (!) 359 lb (162.8 kg)  02/04/18 (!) 372 lb (168.7 kg)  Also Did also have marked elev WBC from UA in ED 2/19 but apparently little symptoms, now however with some dribbling, hesitancy and frequency small amounts  X 2-3 days and had several episodes n/v yesterday, but Denies urinary symptoms such as , flank pain, hematuria or fever, chills. Past Medical History:  Diagnosis Date  . Acute lymphadenitis 2011  . ALLERGIC RHINITIS 08/10/2007  . ANXIETY 12/06/2007  .  Atherosclerotic peripheral vascular disease ([place]) 06/13/2013   Aorta on CT June 2014  . BACK PAIN 12/06/2007  . Cellulitis 2011  . Cervical disc disease 03/09/2012  . Chronic pain 03/09/2012  . DEPRESSION 12/06/2007  . DIVERTICULOSIS, [place] 12/06/2007  . GERD 12/06/2007  . Hepatitis    age 58 hepatitis A  . HYPERTENSION 12/06/2007  . Impaired glucose tolerance 03/13/2012  . LOW BACK PAIN 12/06/2007  . Lumbar disc disease 03/09/2012  . Mesenteric adenitis   . MRSA 2006  . Pain    by breast area  . Pain in joint, multiple sites 04/21/2010  . PARONYCHIA, FINGER 08/13/2009  . Pneumonia sept 2015   walking pneumonia, sepsis   . RASH-NONVESICULAR 05/09/2008  . 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 SHOULDER PAIN, LEFT 05/09/2008  . SINUSITIS- ACUTE-NOS 05/09/2008  . THORACIC/LUMBOSACRAL NEURITIS/RADICULITIS UNSPEC 12/28/2008   Past Surgical History:  Procedure Laterality Date  . ABDOMINAL HYSTERECTOMY  1999   1 ovary left  . bloo clot removed from neck   may 25th , june 2. 2010  . [place] SURGERY  may 24th 2010  . COLONOSCOPY WITH PROPOFOL N/A 07/10/2016   Procedure: COLONOSCOPY WITH PROPOFOL;  Surgeon: Manus Gunning, MD;  Location: [facility];  Service: Gastroenterology;  Laterality: N/A;  . ESOPHAGOGASTRODUODENOSCOPY (EGD) WITH PROPOFOL N/A 07/10/2016   Procedure: ESOPHAGOGASTRODUODENOSCOPY (EGD) WITH PROPOFOL;  Surgeon: Manus Gunning, MD;  Location: [facility];  Service: Gastroenterology;  Laterality: N/A;  . s/p ovary cyst    . s/p right knee arthroscopy     Dr. Mardelle Matte ortho    reports that she quit smoking about 9 years ago. She quit after 30.00 years of use. She has never used smokeless tobacco. She reports that she does not drink alcohol or use drugs. family history includes Asthma in her daughter and sister; Cancer in her maternal uncle and other; Diabetes in her mother; Heart disease in her mother; Heart failure in her mother; Hypertension in her father  and mother; Liver cancer in her paternal grandmother. Allergies  Allergen Reactions  . Adhesive [Tape] Other (See Comments)    Tears skin off - use paper tape   . Amoxicillin-Pot Clavulanate Nausea And Vomiting  . Codeine Other (See Comments)    hallucinations  . Lactose Intolerance (Gi) Nausea And Vomiting  . Morphine And Related Nausea And Vomiting and Other (See Comments)    headaches  . Vicodin [Hydrocodone-Acetaminophen] Other (See Comments)    hallucinations  . Latex Rash   Current Outpatient Medications on File Prior to Visit  Medication Sig Dispense Refill  . clonazePAM (KLONOPIN) 1 MG tablet Take 1 tablet (1 mg total) by mouth 2 (two) times daily as needed for anxiety. 60 tablet 2  . cyclobenzaprine (FLEXERIL) 10 MG tablet Take 1 tablet (10 mg total) daily as needed by mouth for muscle spasms. 90 tablet 1  . dicyclomine (BENTYL) 10 MG capsule Take 1 capsule (10 mg total) 4 (four) times daily -  before meals and at bedtime by mouth. (Patient taking differently: Take 10 mg by mouth 3 (three) times daily as needed for spasms. ) 120 capsule 2  . furosemide (LASIX) 20 MG tablet Take 1 tablet (20 mg total) daily as needed by mouth for fluid. 90 tablet 3  . gabapentin (NEURONTIN) 300 MG capsule Take 1 capsule (300 mg total) 3 (three) times daily by mouth. 90 capsule 3  . losartan (COZAAR) 50 MG tablet Take 50 mg by mouth daily.     . Oxycodone HCl 10 MG TABS Take 1 tablet (10 mg total) 4 (four) times daily by mouth. Per [facility] 30 tablet 0  . pantoprazole (PROTONIX) 40 MG tablet Take 1 tablet (40 mg total) daily by mouth. 90 tablet 3  . sertraline (ZOLOFT) 100 MG tablet TAKE 1 TABLET BY MOUTH EVERY DAY 90 tablet 3  . torsemide (DEMADEX) 10 MG tablet Take 2 tablets (20 mg total) by mouth daily. 30 tablet 0  . triamcinolone cream (KENALOG) 0.1 % Apply 1 application 2 (two) times daily as needed topically (rash). 30 g 11  . [DISCONTINUED] DULoxetine (CYMBALTA) 60 MG capsule  Take 60 mg by mouth daily.      . [DISCONTINUED] fexofenadine (ALLEGRA) 180 MG tablet Take 180 mg by mouth daily.       No current facility-administered medications on file prior to visit.    Review of Systems  Constitutional: Negative for other unusual diaphoresis or sweats HENT: Negative for ear discharge or swelling Eyes: Negative for other worsening visual disturbances Respiratory: Negative for stridor or other swelling  Gastrointestinal: Negative for worsening distension or other blood Genitourinary: Negative for retention or other urinary change Musculoskeletal: Negative for other MSK pain or swelling Skin: Negative for color change or other new lesions Neurological: Negative for worsening tremors and other numbness  Psychiatric/Behavioral: Negative for worsening agitation or other  fatigue All toher system neg per pt    Objective:   Physical Exam BP 134/84   Pulse 85   Temp 98 F (36.7 C) (Oral)   Ht 5\' 5"  (1.651 m)   Wt (!) 355 lb (161 kg)   SpO2 94%   BMI 59.08 kg/m  VS noted, supermorbid obese, non toxic Ambulatory O2 sat - 87% with exertion to 100 ft on RA, then 92% after 5 min rest Constitutional: Pt appears in NAD HENT: Head: NCAT.  Right Ear: External ear normal.  Left Ear: External ear normal.  Eyes: . Pupils are equal, round, and reactive to light. Conjunctivae and EOM are normal Nose: without d/c or deformity Neck: Neck supple. Gross normal ROM Cardiovascular: Normal rate and regular rhythm.   Pulmonary/Chest: Effort normal and breath sounds decreased without rales or wheezing.  Unable for abd exam due to unable to get up on exam table with knee pain; no flank tender Neurological: Pt is alert. At baseline orientation, motor grossly intact Skin: Skin is warm. No rashes, other new lesions, chronic 1-2+ bilat LE edema Psychiatric: Pt behavior is normal without agitation  No other exam findings  Lab Results  Component Value Date   WBC 8.8 03/08/2018   HGB  12.8 03/08/2018   HCT 38.8 03/08/2018   PLT 251 03/08/2018   GLUCOSE 94 03/08/2018   CHOL 201 (H) 11/08/2017   TRIG 183.0 (H) 11/08/2017   HDL 60.10 11/08/2017   LDLDIRECT 96.1 06/13/2013   LDLCALC 104 (H) 11/08/2017   ALT 14 11/08/2017   AST 18 11/08/2017   NA 141 03/08/2018   K 3.4 (L) 03/08/2018   CL 101 03/08/2018   CREATININE 0.82 03/08/2018   BUN 9 03/08/2018   CO2 29 03/08/2018   TSH 3.35 11/08/2017   INR 1.11 01/15/2018   HGBA1C 6.0 11/09/2017      Assessment & Plan:

## 2018-03-21 NOTE — Patient Instructions (Addendum)
OK to increase the lasix to 40 mg per day  Please take all new medication as prescribed - the potassium on the days you take the lasix  Please take all new medication as prescribed- the nausea medication  Please continue all other medications as before, and refills have been done if requested.  Please have the pharmacy call with any other refills you may need.  Please continue your efforts at being more active, low cholesterol diet, and weight control.  You will be contacted regarding the referral for: Sports Medicine for back and knees, and Pulmonary for low oxygen, and possible sleep apnea  Please keep your appointments with your specialists as you may have planned  You will be contacted regarding the referral for: Home oxygen 2L continous  You should see the [facility] now to get a CTA chest (CT of chest)  Please go to the LAB in the Basement (turn left off the elevator) for the tests to be done today - just the urine testing today  You may need an antibiotic depending on the urinary results  You will be contacted by phone if any changes need to be made immediately.  Otherwise, you will receive a letter about your results with an explanation, but please check with MyChart first.  Please remember to sign up for MyChart if you have not done so, as this will be important to you in the future with finding out test results, communicating by private email, and scheduling acute appointments online when needed.  Please return in  1 month, or sooner if needed

## 2018-03-21 NOTE — Assessment & Plan Note (Signed)
With n/v yesterday, for zofran prn, also check UA and cx, tx for UTI depending results

## 2018-03-22 ENCOUNTER — Telehealth: Payer: Self-pay

## 2018-03-22 ENCOUNTER — Encounter: Payer: Self-pay | Admitting: Internal Medicine

## 2018-03-22 ENCOUNTER — Ambulatory Visit (INDEPENDENT_AMBULATORY_CARE_PROVIDER_SITE_OTHER)
Admission: RE | Admit: 2018-03-22 | Discharge: 2018-03-22 | Disposition: A | Discharge status: Home / Self Care | Payer: Medicare Other | Source: Ambulatory Visit | Attending: Internal Medicine | Admitting: Internal Medicine

## 2018-03-22 ENCOUNTER — Other Ambulatory Visit: Payer: Self-pay | Admitting: Internal Medicine

## 2018-03-22 DIAGNOSIS — K5730 Diverticulosis of large intestine without perforation or abscess without bleeding: Secondary | ICD-10-CM | POA: Diagnosis not present

## 2018-03-22 DIAGNOSIS — K760 Fatty (change of) liver, not elsewhere classified: Secondary | ICD-10-CM | POA: Diagnosis not present

## 2018-03-22 DIAGNOSIS — R0602 Shortness of breath: Secondary | ICD-10-CM | POA: Diagnosis not present

## 2018-03-22 DIAGNOSIS — I739 Peripheral vascular disease, unspecified: Secondary | ICD-10-CM | POA: Diagnosis not present

## 2018-03-22 DIAGNOSIS — E041 Nontoxic single thyroid nodule: Secondary | ICD-10-CM

## 2018-03-22 DIAGNOSIS — I872 Venous insufficiency (chronic) (peripheral): Secondary | ICD-10-CM | POA: Diagnosis not present

## 2018-03-22 DIAGNOSIS — L97812 Non-pressure chronic ulcer of other part of right lower leg with fat layer exposed: Secondary | ICD-10-CM | POA: Diagnosis not present

## 2018-03-22 DIAGNOSIS — M797 Fibromyalgia: Secondary | ICD-10-CM | POA: Diagnosis not present

## 2018-03-22 DIAGNOSIS — L97811 Non-pressure chronic ulcer of other part of right lower leg limited to breakdown of skin: Secondary | ICD-10-CM | POA: Diagnosis not present

## 2018-03-22 DIAGNOSIS — L97821 Non-pressure chronic ulcer of other part of left lower leg limited to breakdown of skin: Secondary | ICD-10-CM | POA: Diagnosis not present

## 2018-03-22 DIAGNOSIS — R7303 Prediabetes: Secondary | ICD-10-CM | POA: Diagnosis not present

## 2018-03-22 DIAGNOSIS — M48061 Spinal stenosis, lumbar region without neurogenic claudication: Secondary | ICD-10-CM | POA: Diagnosis not present

## 2018-03-22 DIAGNOSIS — I10 Essential (primary) hypertension: Secondary | ICD-10-CM | POA: Diagnosis not present

## 2018-03-22 DIAGNOSIS — L97822 Non-pressure chronic ulcer of other part of left lower leg with fat layer exposed: Secondary | ICD-10-CM | POA: Diagnosis not present

## 2018-03-22 DIAGNOSIS — G894 Chronic pain syndrome: Secondary | ICD-10-CM | POA: Diagnosis not present

## 2018-03-22 LAB — URINE CULTURE
MICRO NUMBER:: 90400366
SPECIMEN QUALITY:: ADEQUATE

## 2018-03-22 MED ORDER — IOPAMIDOL (ISOVUE-370) INJECTION 76%
80.0000 mL | Freq: Once | INTRAVENOUS | Status: AC | PRN
  Administered 2018-03-22: 80 mL via INTRAVENOUS

## 2018-03-22 NOTE — Assessment & Plan Note (Signed)
Have high suspicion for OSA, for pulm referral

## 2018-03-22 NOTE — Assessment & Plan Note (Signed)
Also activity limiting, for sports med referral

## 2018-03-22 NOTE — Telephone Encounter (Signed)
-----   Message from Biagio Borg, MD sent at 03/21/2018  5:19 PM EDT ----- Maria [name] for Maria [name] to let pt know - UA is normal (does not appear to have infection) but culture is still pending

## 2018-03-22 NOTE — Telephone Encounter (Signed)
Pt has been informed and expressed understanding.  

## 2018-03-22 NOTE — Assessment & Plan Note (Addendum)
Ok for trial increased lasix with K as well; to hold both for any days of n/v  Note:  Total time for pt hx, exam, review of record with pt in the room, determination of diagnoses and plan for further eval and tx is > 40 min, with over 50% spent in coordination and counseling of patient including the differential dx, tx, further evaluation and other management of peripheral edema, daytime somnolence, oxygen desaturation, right knee pain, low back pain and urinary symptoms

## 2018-03-22 NOTE — Assessment & Plan Note (Signed)
With ambulation, for cta chest r/p PE but suspect more likely obesity hypoventilation related; will hold on abg, but for pulm referral

## 2018-03-22 NOTE — Assessment & Plan Note (Signed)
Also for sports medicine referral as is activity limiting which is crucial in her need to be more active and trying to lose wt

## 2018-03-23 ENCOUNTER — Encounter: Payer: Self-pay | Admitting: Internal Medicine

## 2018-03-23 ENCOUNTER — Telehealth: Payer: Self-pay

## 2018-03-23 DIAGNOSIS — L97821 Non-pressure chronic ulcer of other part of left lower leg limited to breakdown of skin: Secondary | ICD-10-CM | POA: Diagnosis not present

## 2018-03-23 DIAGNOSIS — G894 Chronic pain syndrome: Secondary | ICD-10-CM | POA: Diagnosis not present

## 2018-03-23 DIAGNOSIS — M172 Bilateral post-traumatic osteoarthritis of knee: Secondary | ICD-10-CM | POA: Diagnosis not present

## 2018-03-23 DIAGNOSIS — R7303 Prediabetes: Secondary | ICD-10-CM | POA: Diagnosis not present

## 2018-03-23 DIAGNOSIS — G8929 Other chronic pain: Secondary | ICD-10-CM | POA: Diagnosis not present

## 2018-03-23 DIAGNOSIS — M545 Low back pain: Secondary | ICD-10-CM | POA: Diagnosis not present

## 2018-03-23 DIAGNOSIS — I10 Essential (primary) hypertension: Secondary | ICD-10-CM | POA: Diagnosis not present

## 2018-03-23 DIAGNOSIS — Z79891 Long term (current) use of opiate analgesic: Secondary | ICD-10-CM | POA: Diagnosis not present

## 2018-03-23 DIAGNOSIS — I739 Peripheral vascular disease, unspecified: Secondary | ICD-10-CM | POA: Diagnosis not present

## 2018-03-23 DIAGNOSIS — R269 Unspecified abnormalities of gait and mobility: Secondary | ICD-10-CM | POA: Diagnosis not present

## 2018-03-23 DIAGNOSIS — M48061 Spinal stenosis, lumbar region without neurogenic claudication: Secondary | ICD-10-CM | POA: Diagnosis not present

## 2018-03-23 DIAGNOSIS — M539 Dorsopathy, unspecified: Secondary | ICD-10-CM | POA: Diagnosis not present

## 2018-03-23 DIAGNOSIS — I872 Venous insufficiency (chronic) (peripheral): Secondary | ICD-10-CM | POA: Diagnosis not present

## 2018-03-23 DIAGNOSIS — K760 Fatty (change of) liver, not elsewhere classified: Secondary | ICD-10-CM | POA: Diagnosis not present

## 2018-03-23 DIAGNOSIS — J9690 Respiratory failure, unspecified, unspecified whether with hypoxia or hypercapnia: Secondary | ICD-10-CM | POA: Diagnosis not present

## 2018-03-23 DIAGNOSIS — M797 Fibromyalgia: Secondary | ICD-10-CM | POA: Diagnosis not present

## 2018-03-23 DIAGNOSIS — K5730 Diverticulosis of large intestine without perforation or abscess without bleeding: Secondary | ICD-10-CM | POA: Diagnosis not present

## 2018-03-23 DIAGNOSIS — L97811 Non-pressure chronic ulcer of other part of right lower leg limited to breakdown of skin: Secondary | ICD-10-CM | POA: Diagnosis not present

## 2018-03-23 NOTE — Telephone Encounter (Signed)
Pt has been informed and expressed understanding.  

## 2018-03-23 NOTE — Telephone Encounter (Signed)
-----   Message from Biagio Borg, MD sent at 03/22/2018  1:00 PM EDT ----- Letter sent, cont same tx except   The test results show that your current treatment is OK, except there was the coincidental finding of a moderately large nodule of the thyroid.  Thyroid nodules are common, but due to the size on the CT scan, we should look more closely with a thyroid ultrasound, and then consider a biopsy at some point if the nodule meets the criteria where this is normally done   There is no suggestion of cancer at this time, but this evaluation is being done to be sure.Redmond Baseman to please inform pt, I will do thyroid ultrasound order (the FNA biopsy if needed would come later)

## 2018-03-24 DIAGNOSIS — I872 Venous insufficiency (chronic) (peripheral): Secondary | ICD-10-CM | POA: Diagnosis not present

## 2018-03-24 DIAGNOSIS — M797 Fibromyalgia: Secondary | ICD-10-CM | POA: Diagnosis not present

## 2018-03-24 DIAGNOSIS — K760 Fatty (change of) liver, not elsewhere classified: Secondary | ICD-10-CM | POA: Diagnosis not present

## 2018-03-24 DIAGNOSIS — R7303 Prediabetes: Secondary | ICD-10-CM | POA: Diagnosis not present

## 2018-03-24 DIAGNOSIS — L97811 Non-pressure chronic ulcer of other part of right lower leg limited to breakdown of skin: Secondary | ICD-10-CM | POA: Diagnosis not present

## 2018-03-24 DIAGNOSIS — G894 Chronic pain syndrome: Secondary | ICD-10-CM | POA: Diagnosis not present

## 2018-03-24 DIAGNOSIS — L97821 Non-pressure chronic ulcer of other part of left lower leg limited to breakdown of skin: Secondary | ICD-10-CM | POA: Diagnosis not present

## 2018-03-24 DIAGNOSIS — I10 Essential (primary) hypertension: Secondary | ICD-10-CM | POA: Diagnosis not present

## 2018-03-24 DIAGNOSIS — I739 Peripheral vascular disease, unspecified: Secondary | ICD-10-CM | POA: Diagnosis not present

## 2018-03-24 DIAGNOSIS — K5730 Diverticulosis of large intestine without perforation or abscess without bleeding: Secondary | ICD-10-CM | POA: Diagnosis not present

## 2018-03-24 DIAGNOSIS — M48061 Spinal stenosis, lumbar region without neurogenic claudication: Secondary | ICD-10-CM | POA: Diagnosis not present

## 2018-03-25 ENCOUNTER — Telehealth: Payer: Self-pay | Admitting: Internal Medicine

## 2018-03-25 MED ORDER — ONDANSETRON HCL 4 MG PO TABS: 4.0000 mg | ORAL_TABLET | Freq: Three times a day (TID) | ORAL | 0 refills | Status: DC | PRN

## 2018-03-25 MED ORDER — POTASSIUM CHLORIDE ER 10 MEQ PO TBCR: 10.0000 meq | EXTENDED_RELEASE_TABLET | Freq: Every day | ORAL | 3 refills | Status: DC

## 2018-03-25 MED ORDER — FUROSEMIDE 40 MG PO TABS: 40.0000 mg | ORAL_TABLET | Freq: Every day | ORAL | 3 refills | Status: DC

## 2018-03-25 NOTE — Telephone Encounter (Signed)
Per pt. request, sent Rx for Potassium, Lasix and Zofran to [facility] in [place], [place].   Notified [facility] to cancel above Rx's.   Notified pt. Of the above.  Verb. Understanding.

## 2018-03-25 NOTE — Telephone Encounter (Signed)
Copied from [employer] [id number]. Topic: Quick Communication - Rx Refill/Question >> Mar 25, 2018  3:30 PM Margot Ables wrote: Medication: potassium, lasix, zofran - pt states these were sent to the wrong pharmacy - please send to [facility] mail order Has the patient contacted their pharmacy?yes - sent to wrong pharmacy Preferred Pharmacy (with phone number or street name): [place], [place] [phone] (Phone) [phone] (Fax)

## 2018-03-28 ENCOUNTER — Telehealth: Payer: Self-pay | Admitting: Internal Medicine

## 2018-03-28 DIAGNOSIS — L97811 Non-pressure chronic ulcer of other part of right lower leg limited to breakdown of skin: Secondary | ICD-10-CM | POA: Diagnosis not present

## 2018-03-28 DIAGNOSIS — M797 Fibromyalgia: Secondary | ICD-10-CM | POA: Diagnosis not present

## 2018-03-28 DIAGNOSIS — K5730 Diverticulosis of large intestine without perforation or abscess without bleeding: Secondary | ICD-10-CM | POA: Diagnosis not present

## 2018-03-28 DIAGNOSIS — K760 Fatty (change of) liver, not elsewhere classified: Secondary | ICD-10-CM | POA: Diagnosis not present

## 2018-03-28 DIAGNOSIS — L97821 Non-pressure chronic ulcer of other part of left lower leg limited to breakdown of skin: Secondary | ICD-10-CM | POA: Diagnosis not present

## 2018-03-28 DIAGNOSIS — I872 Venous insufficiency (chronic) (peripheral): Secondary | ICD-10-CM | POA: Diagnosis not present

## 2018-03-28 DIAGNOSIS — I739 Peripheral vascular disease, unspecified: Secondary | ICD-10-CM | POA: Diagnosis not present

## 2018-03-28 DIAGNOSIS — G894 Chronic pain syndrome: Secondary | ICD-10-CM | POA: Diagnosis not present

## 2018-03-28 DIAGNOSIS — M48061 Spinal stenosis, lumbar region without neurogenic claudication: Secondary | ICD-10-CM | POA: Diagnosis not present

## 2018-03-28 DIAGNOSIS — R7303 Prediabetes: Secondary | ICD-10-CM | POA: Diagnosis not present

## 2018-03-28 DIAGNOSIS — I10 Essential (primary) hypertension: Secondary | ICD-10-CM | POA: Diagnosis not present

## 2018-03-28 NOTE — Telephone Encounter (Signed)
Copied from [place] [id number]. Topic: General - Other >> Mar 28, 2018  3:59 PM Margot Ables wrote: Would like to request verbal order for mobile imaging for Low back, R hip, and R knee pain - stating pain 10 out of 10 - pt reports fall 2 months ago and has gotten progressively worse. This is a secure line and ok to leave VO on voicemail.

## 2018-03-29 ENCOUNTER — Ambulatory Visit: Payer: Medicare Other | Admitting: Family Medicine

## 2018-03-29 NOTE — Telephone Encounter (Signed)
I would not think this appropriate at this time.  Please have pt make appt with Sports Medicine as we discussed at her recent last visit, as an ultrasound of the knee would likely be more effective at determining the cause.  Other option would be to got to ED or UC (or even here) if she thinks she cannot wait. thanks

## 2018-03-30 DIAGNOSIS — K760 Fatty (change of) liver, not elsewhere classified: Secondary | ICD-10-CM | POA: Diagnosis not present

## 2018-03-30 DIAGNOSIS — I739 Peripheral vascular disease, unspecified: Secondary | ICD-10-CM | POA: Diagnosis not present

## 2018-03-30 DIAGNOSIS — I872 Venous insufficiency (chronic) (peripheral): Secondary | ICD-10-CM | POA: Diagnosis not present

## 2018-03-30 DIAGNOSIS — K5730 Diverticulosis of large intestine without perforation or abscess without bleeding: Secondary | ICD-10-CM | POA: Diagnosis not present

## 2018-03-30 DIAGNOSIS — M48061 Spinal stenosis, lumbar region without neurogenic claudication: Secondary | ICD-10-CM | POA: Diagnosis not present

## 2018-03-30 DIAGNOSIS — M797 Fibromyalgia: Secondary | ICD-10-CM | POA: Diagnosis not present

## 2018-03-30 DIAGNOSIS — G894 Chronic pain syndrome: Secondary | ICD-10-CM | POA: Diagnosis not present

## 2018-03-30 DIAGNOSIS — I10 Essential (primary) hypertension: Secondary | ICD-10-CM | POA: Diagnosis not present

## 2018-03-30 DIAGNOSIS — L97811 Non-pressure chronic ulcer of other part of right lower leg limited to breakdown of skin: Secondary | ICD-10-CM | POA: Diagnosis not present

## 2018-03-30 DIAGNOSIS — R7303 Prediabetes: Secondary | ICD-10-CM | POA: Diagnosis not present

## 2018-03-30 DIAGNOSIS — L97821 Non-pressure chronic ulcer of other part of left lower leg limited to breakdown of skin: Secondary | ICD-10-CM | POA: Diagnosis not present

## 2018-03-30 NOTE — Telephone Encounter (Signed)
Notified Lysbeth Galas w/MD response.Marland KitchenJohny Chess

## 2018-03-31 ENCOUNTER — Telehealth: Payer: Self-pay | Admitting: Internal Medicine

## 2018-03-31 DIAGNOSIS — I10 Essential (primary) hypertension: Secondary | ICD-10-CM | POA: Diagnosis not present

## 2018-03-31 DIAGNOSIS — L97821 Non-pressure chronic ulcer of other part of left lower leg limited to breakdown of skin: Secondary | ICD-10-CM | POA: Diagnosis not present

## 2018-03-31 DIAGNOSIS — K5730 Diverticulosis of large intestine without perforation or abscess without bleeding: Secondary | ICD-10-CM | POA: Diagnosis not present

## 2018-03-31 DIAGNOSIS — R7303 Prediabetes: Secondary | ICD-10-CM | POA: Diagnosis not present

## 2018-03-31 DIAGNOSIS — K760 Fatty (change of) liver, not elsewhere classified: Secondary | ICD-10-CM | POA: Diagnosis not present

## 2018-03-31 DIAGNOSIS — G894 Chronic pain syndrome: Secondary | ICD-10-CM | POA: Diagnosis not present

## 2018-03-31 DIAGNOSIS — L97811 Non-pressure chronic ulcer of other part of right lower leg limited to breakdown of skin: Secondary | ICD-10-CM | POA: Diagnosis not present

## 2018-03-31 DIAGNOSIS — M48061 Spinal stenosis, lumbar region without neurogenic claudication: Secondary | ICD-10-CM | POA: Diagnosis not present

## 2018-03-31 DIAGNOSIS — M797 Fibromyalgia: Secondary | ICD-10-CM | POA: Diagnosis not present

## 2018-03-31 DIAGNOSIS — I872 Venous insufficiency (chronic) (peripheral): Secondary | ICD-10-CM | POA: Diagnosis not present

## 2018-03-31 DIAGNOSIS — I739 Peripheral vascular disease, unspecified: Secondary | ICD-10-CM | POA: Diagnosis not present

## 2018-03-31 NOTE — Telephone Encounter (Signed)
FYI

## 2018-03-31 NOTE — Telephone Encounter (Signed)
Please schedule pt to come on for an OV per PCP

## 2018-03-31 NOTE — Telephone Encounter (Signed)
Ok to let pt to call EMS to go to ED if any worsening, if there are transportation issues, thanks

## 2018-03-31 NOTE — Telephone Encounter (Signed)
Copied from [place] [phone]. Topic: Quick Communication - See Telephone Encounter >> Mar 31, 2018 10:18 AM Rutherford Nail, NT wrote: CRM for notification. See Telephone encounter for: 03/31/18. Lattie Haw, RN from [facility] calling to inform Dr. [name] Reichmann of a change in condition for this patient. States that the patient did not want to use her compression boots today due to redness on her lower left leg. The place on her left leg is not warm to touch, but is tender to the touch. Lattie Haw wonders if the redness is the beginning of cellulitis. States patient has a history of this. States her weight has gone down to 349 pounds. Lattie Haw wanted to see if a nurse could call and discuss and check on the patient with Dr. Gwynn Burly office. CB# for Lattie [phone]

## 2018-03-31 NOTE — Telephone Encounter (Signed)
I would suggest ROV for any worsening redness, tenderness, fever or other unusual symptom

## 2018-03-31 NOTE — Telephone Encounter (Signed)
Please advise 

## 2018-03-31 NOTE — Telephone Encounter (Signed)
Called patient and she said that it is actually cooler than it was before. She is not able to come in without calling for a ride three days in advance. She said that she would keep an eye on it and would contact us if it gets any worse or notices any unusual symptoms that Dr [name] Reichmann mentioned.

## 2018-04-01 DIAGNOSIS — M48061 Spinal stenosis, lumbar region without neurogenic claudication: Secondary | ICD-10-CM | POA: Diagnosis not present

## 2018-04-01 DIAGNOSIS — M797 Fibromyalgia: Secondary | ICD-10-CM | POA: Diagnosis not present

## 2018-04-01 DIAGNOSIS — K5730 Diverticulosis of large intestine without perforation or abscess without bleeding: Secondary | ICD-10-CM | POA: Diagnosis not present

## 2018-04-01 DIAGNOSIS — I872 Venous insufficiency (chronic) (peripheral): Secondary | ICD-10-CM | POA: Diagnosis not present

## 2018-04-01 DIAGNOSIS — L97811 Non-pressure chronic ulcer of other part of right lower leg limited to breakdown of skin: Secondary | ICD-10-CM | POA: Diagnosis not present

## 2018-04-01 DIAGNOSIS — K760 Fatty (change of) liver, not elsewhere classified: Secondary | ICD-10-CM | POA: Diagnosis not present

## 2018-04-01 DIAGNOSIS — R7303 Prediabetes: Secondary | ICD-10-CM | POA: Diagnosis not present

## 2018-04-01 DIAGNOSIS — I739 Peripheral vascular disease, unspecified: Secondary | ICD-10-CM | POA: Diagnosis not present

## 2018-04-01 DIAGNOSIS — G894 Chronic pain syndrome: Secondary | ICD-10-CM | POA: Diagnosis not present

## 2018-04-01 DIAGNOSIS — L97821 Non-pressure chronic ulcer of other part of left lower leg limited to breakdown of skin: Secondary | ICD-10-CM | POA: Diagnosis not present

## 2018-04-01 DIAGNOSIS — I10 Essential (primary) hypertension: Secondary | ICD-10-CM | POA: Diagnosis not present

## 2018-04-01 NOTE — Telephone Encounter (Signed)
Pt notified and expressed understanding

## 2018-04-05 DIAGNOSIS — R7303 Prediabetes: Secondary | ICD-10-CM | POA: Diagnosis not present

## 2018-04-05 DIAGNOSIS — Z9181 History of falling: Secondary | ICD-10-CM | POA: Diagnosis not present

## 2018-04-05 DIAGNOSIS — K5730 Diverticulosis of large intestine without perforation or abscess without bleeding: Secondary | ICD-10-CM | POA: Diagnosis not present

## 2018-04-05 DIAGNOSIS — Z87891 Personal history of nicotine dependence: Secondary | ICD-10-CM | POA: Diagnosis not present

## 2018-04-05 DIAGNOSIS — M48061 Spinal stenosis, lumbar region without neurogenic claudication: Secondary | ICD-10-CM | POA: Diagnosis not present

## 2018-04-05 DIAGNOSIS — Z79891 Long term (current) use of opiate analgesic: Secondary | ICD-10-CM | POA: Diagnosis not present

## 2018-04-05 DIAGNOSIS — I10 Essential (primary) hypertension: Secondary | ICD-10-CM | POA: Diagnosis not present

## 2018-04-05 DIAGNOSIS — I739 Peripheral vascular disease, unspecified: Secondary | ICD-10-CM | POA: Diagnosis not present

## 2018-04-05 DIAGNOSIS — M47816 Spondylosis without myelopathy or radiculopathy, lumbar region: Secondary | ICD-10-CM | POA: Diagnosis not present

## 2018-04-05 DIAGNOSIS — M172 Bilateral post-traumatic osteoarthritis of knee: Secondary | ICD-10-CM | POA: Diagnosis not present

## 2018-04-05 DIAGNOSIS — M797 Fibromyalgia: Secondary | ICD-10-CM | POA: Diagnosis not present

## 2018-04-05 DIAGNOSIS — K760 Fatty (change of) liver, not elsewhere classified: Secondary | ICD-10-CM | POA: Diagnosis not present

## 2018-04-05 DIAGNOSIS — G894 Chronic pain syndrome: Secondary | ICD-10-CM | POA: Diagnosis not present

## 2018-04-07 ENCOUNTER — Encounter: Payer: Self-pay | Admitting: Internal Medicine

## 2018-04-07 ENCOUNTER — Ambulatory Visit
Admission: RE | Admit: 2018-04-07 | Discharge: 2018-04-07 | Disposition: A | Discharge status: Home / Self Care | Payer: Medicare Other | Source: Ambulatory Visit | Attending: Internal Medicine | Admitting: Internal Medicine

## 2018-04-07 DIAGNOSIS — E041 Nontoxic single thyroid nodule: Secondary | ICD-10-CM

## 2018-04-11 ENCOUNTER — Telehealth: Payer: Self-pay | Admitting: Internal Medicine

## 2018-04-11 NOTE — Telephone Encounter (Signed)
Letter was just sent today due to the holiday weekend.  No Changes in thyroid status.  She should get the letter soon. thanks

## 2018-04-11 NOTE — Telephone Encounter (Signed)
Copied from [place] [id number]. Topic: Quick Communication - See Telephone Encounter >> Apr 11, 2018  2:04 PM Vernona Rieger wrote: CRM for notification. See Telephone encounter for: 04/11/18.  Patient would like to know what her results were from her ultrasound for her thyroid. Please call back @ [phone]

## 2018-04-12 DIAGNOSIS — M797 Fibromyalgia: Secondary | ICD-10-CM | POA: Diagnosis not present

## 2018-04-12 DIAGNOSIS — Z87891 Personal history of nicotine dependence: Secondary | ICD-10-CM | POA: Diagnosis not present

## 2018-04-12 DIAGNOSIS — Z9181 History of falling: Secondary | ICD-10-CM | POA: Diagnosis not present

## 2018-04-12 DIAGNOSIS — Z79891 Long term (current) use of opiate analgesic: Secondary | ICD-10-CM | POA: Diagnosis not present

## 2018-04-12 DIAGNOSIS — K760 Fatty (change of) liver, not elsewhere classified: Secondary | ICD-10-CM | POA: Diagnosis not present

## 2018-04-12 DIAGNOSIS — K5730 Diverticulosis of large intestine without perforation or abscess without bleeding: Secondary | ICD-10-CM | POA: Diagnosis not present

## 2018-04-12 DIAGNOSIS — I10 Essential (primary) hypertension: Secondary | ICD-10-CM | POA: Diagnosis not present

## 2018-04-12 DIAGNOSIS — M172 Bilateral post-traumatic osteoarthritis of knee: Secondary | ICD-10-CM | POA: Diagnosis not present

## 2018-04-12 DIAGNOSIS — M48061 Spinal stenosis, lumbar region without neurogenic claudication: Secondary | ICD-10-CM | POA: Diagnosis not present

## 2018-04-12 DIAGNOSIS — R7303 Prediabetes: Secondary | ICD-10-CM | POA: Diagnosis not present

## 2018-04-12 DIAGNOSIS — G894 Chronic pain syndrome: Secondary | ICD-10-CM | POA: Diagnosis not present

## 2018-04-12 DIAGNOSIS — I739 Peripheral vascular disease, unspecified: Secondary | ICD-10-CM | POA: Diagnosis not present

## 2018-04-12 DIAGNOSIS — M47816 Spondylosis without myelopathy or radiculopathy, lumbar region: Secondary | ICD-10-CM | POA: Diagnosis not present

## 2018-04-12 NOTE — Telephone Encounter (Signed)
Pt has been informed and expressed understanding.  

## 2018-04-18 DIAGNOSIS — K760 Fatty (change of) liver, not elsewhere classified: Secondary | ICD-10-CM | POA: Diagnosis not present

## 2018-04-18 DIAGNOSIS — G894 Chronic pain syndrome: Secondary | ICD-10-CM | POA: Diagnosis not present

## 2018-04-18 DIAGNOSIS — R7303 Prediabetes: Secondary | ICD-10-CM | POA: Diagnosis not present

## 2018-04-18 DIAGNOSIS — M48061 Spinal stenosis, lumbar region without neurogenic claudication: Secondary | ICD-10-CM | POA: Diagnosis not present

## 2018-04-18 DIAGNOSIS — Z87891 Personal history of nicotine dependence: Secondary | ICD-10-CM | POA: Diagnosis not present

## 2018-04-18 DIAGNOSIS — Z79891 Long term (current) use of opiate analgesic: Secondary | ICD-10-CM | POA: Diagnosis not present

## 2018-04-18 DIAGNOSIS — Z9181 History of falling: Secondary | ICD-10-CM | POA: Diagnosis not present

## 2018-04-18 DIAGNOSIS — M797 Fibromyalgia: Secondary | ICD-10-CM | POA: Diagnosis not present

## 2018-04-18 DIAGNOSIS — M47816 Spondylosis without myelopathy or radiculopathy, lumbar region: Secondary | ICD-10-CM | POA: Diagnosis not present

## 2018-04-18 DIAGNOSIS — K5730 Diverticulosis of large intestine without perforation or abscess without bleeding: Secondary | ICD-10-CM | POA: Diagnosis not present

## 2018-04-18 DIAGNOSIS — I739 Peripheral vascular disease, unspecified: Secondary | ICD-10-CM | POA: Diagnosis not present

## 2018-04-18 DIAGNOSIS — I10 Essential (primary) hypertension: Secondary | ICD-10-CM | POA: Diagnosis not present

## 2018-04-18 DIAGNOSIS — M172 Bilateral post-traumatic osteoarthritis of knee: Secondary | ICD-10-CM | POA: Diagnosis not present

## 2018-04-26 DIAGNOSIS — M797 Fibromyalgia: Secondary | ICD-10-CM | POA: Diagnosis not present

## 2018-04-26 DIAGNOSIS — G894 Chronic pain syndrome: Secondary | ICD-10-CM | POA: Diagnosis not present

## 2018-04-26 DIAGNOSIS — Z9181 History of falling: Secondary | ICD-10-CM | POA: Diagnosis not present

## 2018-04-26 DIAGNOSIS — M172 Bilateral post-traumatic osteoarthritis of knee: Secondary | ICD-10-CM | POA: Diagnosis not present

## 2018-04-26 DIAGNOSIS — K760 Fatty (change of) liver, not elsewhere classified: Secondary | ICD-10-CM | POA: Diagnosis not present

## 2018-04-26 DIAGNOSIS — Z87891 Personal history of nicotine dependence: Secondary | ICD-10-CM | POA: Diagnosis not present

## 2018-04-26 DIAGNOSIS — M47816 Spondylosis without myelopathy or radiculopathy, lumbar region: Secondary | ICD-10-CM | POA: Diagnosis not present

## 2018-04-26 DIAGNOSIS — R7303 Prediabetes: Secondary | ICD-10-CM | POA: Diagnosis not present

## 2018-04-26 DIAGNOSIS — I10 Essential (primary) hypertension: Secondary | ICD-10-CM | POA: Diagnosis not present

## 2018-04-26 DIAGNOSIS — M48061 Spinal stenosis, lumbar region without neurogenic claudication: Secondary | ICD-10-CM | POA: Diagnosis not present

## 2018-04-26 DIAGNOSIS — I739 Peripheral vascular disease, unspecified: Secondary | ICD-10-CM | POA: Diagnosis not present

## 2018-04-26 DIAGNOSIS — Z79891 Long term (current) use of opiate analgesic: Secondary | ICD-10-CM | POA: Diagnosis not present

## 2018-04-26 DIAGNOSIS — K5730 Diverticulosis of large intestine without perforation or abscess without bleeding: Secondary | ICD-10-CM | POA: Diagnosis not present

## 2018-04-27 DIAGNOSIS — G894 Chronic pain syndrome: Secondary | ICD-10-CM | POA: Diagnosis not present

## 2018-04-27 DIAGNOSIS — M25512 Pain in left shoulder: Secondary | ICD-10-CM | POA: Diagnosis not present

## 2018-04-27 DIAGNOSIS — M545 Low back pain: Secondary | ICD-10-CM | POA: Diagnosis not present

## 2018-04-27 DIAGNOSIS — Z79891 Long term (current) use of opiate analgesic: Secondary | ICD-10-CM | POA: Diagnosis not present

## 2018-04-27 DIAGNOSIS — G8929 Other chronic pain: Secondary | ICD-10-CM | POA: Diagnosis not present

## 2018-04-27 DIAGNOSIS — M25561 Pain in right knee: Secondary | ICD-10-CM | POA: Diagnosis not present

## 2018-04-27 DIAGNOSIS — M25562 Pain in left knee: Secondary | ICD-10-CM | POA: Diagnosis not present

## 2018-04-28 ENCOUNTER — Other Ambulatory Visit: Payer: Self-pay | Admitting: Internal Medicine

## 2018-04-28 NOTE — Telephone Encounter (Signed)
Done erx 

## 2018-04-28 NOTE — Telephone Encounter (Signed)
03/31/2018 60#  Is the zofran refill appropriate?

## 2018-04-30 ENCOUNTER — Other Ambulatory Visit: Payer: Self-pay | Admitting: Internal Medicine

## 2018-05-09 ENCOUNTER — Encounter: Payer: Self-pay | Admitting: Pulmonary Disease

## 2018-05-09 ENCOUNTER — Ambulatory Visit (INDEPENDENT_AMBULATORY_CARE_PROVIDER_SITE_OTHER): Payer: Medicare Other | Admitting: Pulmonary Disease

## 2018-05-09 DIAGNOSIS — R0602 Shortness of breath: Secondary | ICD-10-CM | POA: Diagnosis not present

## 2018-05-09 DIAGNOSIS — G4733 Obstructive sleep apnea (adult) (pediatric): Secondary | ICD-10-CM | POA: Diagnosis not present

## 2018-05-09 NOTE — Progress Notes (Signed)
Subjective:    Patient ID: Maria [name], female    DOB: [birth year], 62 y.o.   MRN: [id number]  HPI  62 year old ex-smoker presents for evaluation of sleep disordered breathing.  She would also like to be evaluated for dyspnea on. She smoked 1/2 pack/day starting as a teenager until she quit at age 73, less than 15 pack years.  She quit in between for about 5 years.  She feels winded on walking.  She is unable to climb stairs.  She is limited by chronic pain due to fibromyalgia and degenerative disc disease.  She ambulates with a cane She denies wheezing or frequent chest colds.  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 She had a sleep study years ago in 2003 and was told that she does not have sleep apnea but did have restless leg syndrome.  She was placed on Requip with good improvement in her symptoms.  She describes her legs feeling like "soda bubbles running through her leg veins", especially towards evenings.   Epworth sleepiness score is 5. Bedtime is between 11 PM and midnight, sleep latency is minimal, she sleeps on her left side with 2 pillows, reports 2-3 nocturnal awakenings including nocturia and is out of bed between 830 9:30 AM feeling tired without dryness of mouth or headaches. She will occasionally doze off in the daytime, unintentional naps There is no history suggestive of cataplexy, sleep paralysis or parasomnias  She was able to ambulate for only 1 more lap around the office - oxygen saturation stayed at 96%  Significant tests/ events reviewed  01/2018 echo showed normal LV systolic function 01/7516 CT angiogram negative for pulmonary embolism   Past Medical History:  Diagnosis Date  . Acute lymphadenitis 2011  . ALLERGIC RHINITIS 08/10/2007  . ANXIETY 12/06/2007  . Atherosclerotic peripheral vascular disease ([place]) 06/13/2013   Aorta on CT June 2014  . BACK PAIN 12/06/2007  . Cellulitis 2011  . Cervical disc disease  03/09/2012  . Chronic pain 03/09/2012  . DEPRESSION 12/06/2007  . DIVERTICULOSIS, [place] 12/06/2007  . GERD 12/06/2007  . Hepatitis    age 4 hepatitis A  . HYPERTENSION 12/06/2007  . Impaired glucose tolerance 03/13/2012  . LOW BACK PAIN 12/06/2007  . Lumbar disc disease 03/09/2012  . Mesenteric adenitis   . MRSA 2006  . Pain    by breast area  . Pain in joint, multiple sites 04/21/2010  . PARONYCHIA, FINGER 08/13/2009  . Pneumonia sept 2015   walking pneumonia, sepsis   . RASH-NONVESICULAR 05/09/2008  . 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 SHOULDER PAIN, LEFT 05/09/2008  . SINUSITIS- ACUTE-NOS 05/09/2008  . THORACIC/LUMBOSACRAL NEURITIS/RADICULITIS UNSPEC 12/28/2008   Past Surgical History:  Procedure Laterality Date  . ABDOMINAL HYSTERECTOMY  1999   1 ovary left  . bloo clot removed from neck   may 25th , june 2. 2010  . [place] SURGERY  may 24th 2010  . COLONOSCOPY WITH PROPOFOL N/A 07/10/2016   Procedure: COLONOSCOPY WITH PROPOFOL;  Surgeon: Manus Gunning, MD;  Location: [facility];  Service: Gastroenterology;  Laterality: N/A;  . ESOPHAGOGASTRODUODENOSCOPY (EGD) WITH PROPOFOL N/A 07/10/2016   Procedure: ESOPHAGOGASTRODUODENOSCOPY (EGD) WITH PROPOFOL;  Surgeon: Manus Gunning, MD;  Location: [facility];  Service: Gastroenterology;  Laterality: N/A;  . s/p ovary cyst    . s/p right knee arthroscopy     Dr. Mardelle Matte ortho    Allergies  Allergen Reactions  . Adhesive [Tape]  Other (See Comments)    Tears skin off - use paper tape   . Amoxicillin-Pot Clavulanate Nausea And Vomiting  . Codeine Other (See Comments)    hallucinations  . Lactose Intolerance (Gi) Nausea And Vomiting  . Morphine And Related Nausea And Vomiting and Other (See Comments)    headaches  . Vicodin [Hydrocodone-Acetaminophen] Other (See Comments)    hallucinations  . Latex Rash    Social History   Socioeconomic History  . Marital status: Divorced    Spouse name: Not on  file  . Number of children: 2  . Years of education: Not on file  . Highest education level: Not on file  Occupational History  . Occupation: Chemical engineer: Forensic scientist  . 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 Financial resource strain: Very hard  . Food insecurity:    Worry: Often true    Inability: Often true  . Transportation needs:    Medical: Yes    Non-medical: Yes  Tobacco Use  . Smoking status: Former Smoker    Years: 30.00    Last attempt to quit: 11/08/2008    Years since quitting: 9.5  . Smokeless tobacco: Never Used  . Tobacco comment: quit 10/09  Substance and Sexual Activity  . Alcohol use: No  . Drug use: No  . Sexual activity: Never    Birth control/protection: Surgical  Lifestyle  . Physical activity:    Days per week: 0 days    Minutes per session: 0 min  . Stress: Very much  Relationships  . Social connections:    Talks on phone: Three times a week    Gets together: Three times a week    Attends religious service: More than 4 times per year    Active member of club or organization: Not on file    Attends meetings of clubs or organizations: More than 4 times per year    Relationship status: Divorced  . Intimate partner violence:    Fear of current or ex partner: Not on file    Emotionally abused: Not on file    Physically abused: Not on file    Forced sexual activity: Not on file  Other Topics Concern  . Not on file  Social History Narrative   Patient has difficulty financially affording food, medication, and affording essential bills. She states she has limited support from her daughter.       Family History  Problem Relation Age of Onset  . Heart disease Mother   . Hypertension Mother   . Diabetes Mother   . Heart failure Mother   . Asthma Sister   . Hypertension Father   . Asthma Daughter   . Cancer Maternal Uncle        [place]  . Cancer Other        ovarian  . Liver cancer Paternal  Grandmother        ????     Review of Systems Positive for dyspnea on exertion, chronic bipedal edema, weight gain  Constitutional: negative for anorexia, fevers and sweats  Eyes: negative for irritation, redness and visual disturbance  Ears, nose, mouth, throat, and face: negative for earaches, epistaxis, nasal congestion and sore throat  Respiratory: negative for cough, , sputum and wheezing  Cardiovascular: negative for chest pain, orthopnea, palpitations and syncope  Gastrointestinal: negative for abdominal pain, constipation, diarrhea, melena, nausea and vomiting  Genitourinary:negative for dysuria, frequency and hematuria  Hematologic/lymphatic: negative  for bleeding, easy bruising and lymphadenopathy  Musculoskeletal:negative for arthralgias, muscle weakness and stiff joints  Neurological: negative for coordination problems, gait problems, headaches and weakness  Endocrine: negative for diabetic symptoms including polydipsia, polyuria and weight loss     Objective:   Physical Exam  Gen. Pleasant, obese, in no distress ENT - class 2 airway, no post nasal drip Neck: No JVD, no thyromegaly, no carotid bruits Lungs: no use of accessory muscles, no dullness to percussion, decreased without rales or rhonchi  Cardiovascular: Rhythm regular, heart sounds  normal, no murmurs or gallops, 1+ peripheral edema Musculoskeletal: No deformities, no cyanosis or clubbing , no tremors        Assessment & Plan:

## 2018-05-09 NOTE — Assessment & Plan Note (Signed)
Does not desaturate on ambulation. Likely related to deconditioning and obesity  will obtain spirometry for completion but she seems to have smoked less than 15 pack years CT angiogram negative for pulmonary embolism and echo did not show any evidence of pulmonary hypertension

## 2018-05-09 NOTE — Patient Instructions (Signed)
Schedule home sleep study.   

## 2018-05-09 NOTE — Assessment & Plan Note (Signed)
Given excessive daytime somnolence, narrow pharyngeal exam, witnessed apneas & loud snoring, obstructive sleep apnea is very likely & an overnight polysomnogram will be scheduled as a home study. The pathophysiology of obstructive sleep apnea , it's cardiovascular consequences & modes of treatment including CPAP were discused with the patient in detail & they evidenced understanding.  Pretest probability is high 

## 2018-05-10 ENCOUNTER — Ambulatory Visit: Payer: Medicare Other | Admitting: Internal Medicine

## 2018-05-12 ENCOUNTER — Telehealth: Payer: Self-pay | Admitting: Internal Medicine

## 2018-05-12 DIAGNOSIS — M797 Fibromyalgia: Secondary | ICD-10-CM | POA: Diagnosis not present

## 2018-05-12 DIAGNOSIS — Z79891 Long term (current) use of opiate analgesic: Secondary | ICD-10-CM | POA: Diagnosis not present

## 2018-05-12 DIAGNOSIS — M48061 Spinal stenosis, lumbar region without neurogenic claudication: Secondary | ICD-10-CM | POA: Diagnosis not present

## 2018-05-12 DIAGNOSIS — K760 Fatty (change of) liver, not elsewhere classified: Secondary | ICD-10-CM | POA: Diagnosis not present

## 2018-05-12 DIAGNOSIS — I739 Peripheral vascular disease, unspecified: Secondary | ICD-10-CM | POA: Diagnosis not present

## 2018-05-12 DIAGNOSIS — M47816 Spondylosis without myelopathy or radiculopathy, lumbar region: Secondary | ICD-10-CM | POA: Diagnosis not present

## 2018-05-12 DIAGNOSIS — K5730 Diverticulosis of large intestine without perforation or abscess without bleeding: Secondary | ICD-10-CM | POA: Diagnosis not present

## 2018-05-12 DIAGNOSIS — Z9181 History of falling: Secondary | ICD-10-CM | POA: Diagnosis not present

## 2018-05-12 DIAGNOSIS — I10 Essential (primary) hypertension: Secondary | ICD-10-CM | POA: Diagnosis not present

## 2018-05-12 DIAGNOSIS — G894 Chronic pain syndrome: Secondary | ICD-10-CM | POA: Diagnosis not present

## 2018-05-12 DIAGNOSIS — M172 Bilateral post-traumatic osteoarthritis of knee: Secondary | ICD-10-CM | POA: Diagnosis not present

## 2018-05-12 DIAGNOSIS — R7303 Prediabetes: Secondary | ICD-10-CM | POA: Diagnosis not present

## 2018-05-12 DIAGNOSIS — Z87891 Personal history of nicotine dependence: Secondary | ICD-10-CM | POA: Diagnosis not present

## 2018-05-12 NOTE — Telephone Encounter (Signed)
Copied from [place] [id number]. Topic: Quick Communication - See Telephone Encounter >> May 12, 2018  1:14 PM Conception Chancy, NT wrote: CRM for notification. See Telephone encounter for: 05/12/18.  Mo is a Therapist, sports with [facility] and states the patient had a fall on 05/09/18 and states she needs verbals for skilled nursing for 1 week 3. Please contact.  Mo Cb# [phone]

## 2018-05-12 NOTE — Telephone Encounter (Signed)
Verbal orders given  

## 2018-05-12 NOTE — Telephone Encounter (Signed)
Ok for verbals 

## 2018-05-13 ENCOUNTER — Emergency Department (HOSPITAL_COMMUNITY): Payer: Medicare Other

## 2018-05-13 ENCOUNTER — Telehealth: Payer: Self-pay | Admitting: Internal Medicine

## 2018-05-13 ENCOUNTER — Emergency Department (HOSPITAL_COMMUNITY)
Admission: EM | Admit: 2018-05-13 | Discharge: 2018-05-14 | Disposition: A | Discharge status: Home / Self Care | Payer: Medicare Other | Attending: Emergency Medicine | Admitting: Emergency Medicine

## 2018-05-13 ENCOUNTER — Encounter (HOSPITAL_COMMUNITY): Payer: Self-pay | Admitting: Emergency Medicine

## 2018-05-13 DIAGNOSIS — R635 Abnormal weight gain: Secondary | ICD-10-CM | POA: Diagnosis present

## 2018-05-13 DIAGNOSIS — Z79899 Other long term (current) drug therapy: Secondary | ICD-10-CM | POA: Diagnosis not present

## 2018-05-13 DIAGNOSIS — R0600 Dyspnea, unspecified: Secondary | ICD-10-CM | POA: Diagnosis not present

## 2018-05-13 DIAGNOSIS — K5900 Constipation, unspecified: Secondary | ICD-10-CM | POA: Diagnosis not present

## 2018-05-13 DIAGNOSIS — R109 Unspecified abdominal pain: Secondary | ICD-10-CM | POA: Diagnosis not present

## 2018-05-13 DIAGNOSIS — R0602 Shortness of breath: Secondary | ICD-10-CM | POA: Diagnosis not present

## 2018-05-13 DIAGNOSIS — R600 Localized edema: Secondary | ICD-10-CM | POA: Insufficient documentation

## 2018-05-13 DIAGNOSIS — Z91040 Latex allergy status: Secondary | ICD-10-CM | POA: Insufficient documentation

## 2018-05-13 DIAGNOSIS — Z87891 Personal history of nicotine dependence: Secondary | ICD-10-CM | POA: Insufficient documentation

## 2018-05-13 DIAGNOSIS — K654 Sclerosing mesenteritis: Secondary | ICD-10-CM | POA: Diagnosis not present

## 2018-05-13 DIAGNOSIS — I10 Essential (primary) hypertension: Secondary | ICD-10-CM | POA: Insufficient documentation

## 2018-05-13 DIAGNOSIS — R1084 Generalized abdominal pain: Secondary | ICD-10-CM | POA: Diagnosis not present

## 2018-05-13 LAB — CBC WITH DIFFERENTIAL/PLATELET
Basophils Absolute: 0 10*3/uL (ref 0.0–0.1)
Basophils Relative: 0 %
EOS ABS: 0.1 10*3/uL (ref 0.0–0.7)
Eosinophils Relative: 1 %
HEMATOCRIT: 39 % (ref 36.0–46.0)
HEMOGLOBIN: 12.7 g/dL (ref 12.0–15.0)
LYMPHS ABS: 2.2 10*3/uL (ref 0.7–4.0)
Lymphocytes Relative: 27 %
MCH: 28.9 pg (ref 26.0–34.0)
MCHC: 32.6 g/dL (ref 30.0–36.0)
MCV: 88.8 fL (ref 78.0–100.0)
MONOS PCT: 7 %
Monocytes Absolute: 0.5 10*3/uL (ref 0.1–1.0)
NEUTROS PCT: 65 %
Neutro Abs: 5.3 10*3/uL (ref 1.7–7.7)
Platelets: 219 10*3/uL (ref 150–400)
RBC: 4.39 MIL/uL (ref 3.87–5.11)
RDW: 13.4 % (ref 11.5–15.5)
WBC: 8.1 10*3/uL (ref 4.0–10.5)

## 2018-05-13 MED ORDER — HYDROMORPHONE HCL 1 MG/ML IJ SOLN
0.5000 mg | Freq: Once | INTRAMUSCULAR | Status: AC
  Administered 2018-05-14: 0.5 mg via INTRAVENOUS
  Filled 2018-05-13: qty 1

## 2018-05-13 MED ORDER — FUROSEMIDE 10 MG/ML IJ SOLN
40.0000 mg | Freq: Once | INTRAMUSCULAR | Status: AC
Start: 2018-05-13 — End: 2018-05-14
  Administered 2018-05-14: 40 mg via INTRAVENOUS
  Filled 2018-05-13: qty 4

## 2018-05-13 NOTE — ED Triage Notes (Signed)
Pt reports that she has gain 5.5 lbs in 2 days and had worsening with SOB. Denies CHF. Pt takes Lasix 40mg  daily. Pt c/o left shoulder pain that been hurting for couple days after a fall.

## 2018-05-13 NOTE — Telephone Encounter (Signed)
Pt in now at 349.5 lbs..  Increased lasix, increased lethargy, more anxiety.   cb is [phone] [name] Reichmann with [facility]

## 2018-05-13 NOTE — Telephone Encounter (Signed)
Please proceed to ED to rule out respiratory failure due to volume overload

## 2018-05-13 NOTE — Telephone Encounter (Signed)
States that patient is on telecare.  States that patient has put on more than 5lbs since the 21st.  Patient has increased lasix dosage but does not have enough urine output.  Requesting orders from MD.   Spoke with [facility].

## 2018-05-13 NOTE — Telephone Encounter (Signed)
Called [name] with [facility] MD response.   Called pt at mobile number - Informed of same. Pt stated understanding and will have someone take her to the ER.

## 2018-05-13 NOTE — ED Provider Notes (Addendum)
[facility] DEPT Provider Note   CSN: [id number] Arrival date & time: 05/13/18  5400     History   Chief Complaint Chief Complaint  Patient presents with  . Weight Gain    HPI Maria [name] is a 62 y.o. female.  Patient with a history of HTN, GERD, morbid obesity, depression, presents with concern for weight gain of 5.5 pounds over the last 2 days in the setting of increasing LE edema and mild SOB. No history of CHF but she is on Lasix, usually 20 mg daily that was increased to 40 mg 1 week ago by her PCP. She reports this increase has not helped reduce her swelling. No chest pain. She states bothering more today is abdominal pain and constipation with last normal bowel movement 5 days ago. She has been taking stool softeners without relief. She continues to pass gas and produced a small amount of stool yesterday and the day before but states her abdominal pain is getter worse. No nausea or vomiting. No fever. No urinary symptoms.   The history is provided by the patient. No language interpreter was used.    Past Medical History:  Diagnosis Date  . Acute lymphadenitis 2011  . ALLERGIC RHINITIS 08/10/2007  . ANXIETY 12/06/2007  . Atherosclerotic peripheral vascular disease ([place]) 06/13/2013   Aorta on CT June 2014  . BACK PAIN 12/06/2007  . Cellulitis 2011  . Cervical disc disease 03/09/2012  . Chronic pain 03/09/2012  . DEPRESSION 12/06/2007  . DIVERTICULOSIS, [place] 12/06/2007  . GERD 12/06/2007  . Hepatitis    age 29 hepatitis A  . HYPERTENSION 12/06/2007  . Impaired glucose tolerance 03/13/2012  . LOW BACK PAIN 12/06/2007  . Lumbar disc disease 03/09/2012  . Mesenteric adenitis   . MRSA 2006  . Pain    by breast area  . Pain in joint, multiple sites 04/21/2010  . PARONYCHIA, FINGER 08/13/2009  . Pneumonia sept 2015   walking pneumonia, sepsis   . RASH-NONVESICULAR 05/09/2008  . 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 SHOULDER PAIN, LEFT  05/09/2008  . SINUSITIS- ACUTE-NOS 05/09/2008  . THORACIC/LUMBOSACRAL NEURITIS/RADICULITIS UNSPEC 12/28/2008    Patient Active Problem List   Diagnosis Date Noted  . Right knee pain 03/21/2018  . OSA (obstructive sleep apnea) 03/21/2018  . Oxygen desaturation 03/21/2018  . Urinary symptom or sign 03/21/2018  . Acquired lymphedema 01/25/2018  . Gait disorder 01/25/2018  . Peripheral edema 01/16/2018  . SOB (shortness of breath) 01/15/2018  . 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 Preventative health care 11/08/2017  . Abdominal pain, epigastric   . Dysphagia   . [place] cancer screening   . Adverse effect of angiotensin-converting enzyme inhibitor 12/05/2015  . Restless leg 10/08/2015  . Dermatitis, stasis 04/26/2015  . Fibromyalgia 01/31/2015  . Acid reflux 10/17/2014  . 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 Post-traumatic osteoarthritis of both knees 09/19/2014  . Spinal stenosis 09/19/2014  . Cervical disc disease 03/09/2012  . Lumbar disc disease 03/09/2012  . Chronic pain 03/09/2012  . Obesity 06/17/2011  . Chronic low back pain 12/28/2008  . Anxiety state 12/06/2007  . Depression 12/06/2007  . Essential hypertension 12/06/2007  . LOW BACK PAIN 12/06/2007    Past Surgical History:  Procedure Laterality Date  . ABDOMINAL HYSTERECTOMY  1999   1 ovary left  . bloo clot removed from neck   may 25th , june 2. 2010  . [place] SURGERY  may 24th 2010  . COLONOSCOPY  WITH PROPOFOL N/A 07/10/2016   Procedure: COLONOSCOPY WITH PROPOFOL;  Surgeon: Manus Gunning, MD;  Location: [facility];  Service: Gastroenterology;  Laterality: N/A;  . ESOPHAGOGASTRODUODENOSCOPY (EGD) WITH PROPOFOL N/A 07/10/2016   Procedure: ESOPHAGOGASTRODUODENOSCOPY (EGD) WITH PROPOFOL;  Surgeon: Manus Gunning, MD;  Location: [facility];  Service: Gastroenterology;  Laterality: N/A;  . s/p ovary cyst    . s/p right knee arthroscopy     Dr. Mardelle Matte ortho     OB History    None      Home Medications    Prior to Admission medications   Medication Sig Start Date End Date Taking? Authorizing Provider  clonazePAM (KLONOPIN) 1 MG tablet TAKE 1 TABLET BY MOUTH 2 TIMES DAILY AS NEEDED FOR ANXIETY 04/28/18  Yes Biagio Borg, MD  cyclobenzaprine (FLEXERIL) 10 MG tablet Take 1 tablet (10 mg total) daily as needed by mouth for muscle spasms. Patient taking differently: Take 10 mg by mouth daily after breakfast.  11/08/17  Yes Biagio Borg, MD  dicyclomine (BENTYL) 10 MG capsule Take 1 capsule (10 mg total) 4 (four) times daily -  before meals and at bedtime by mouth. Patient taking differently: Take 10 mg by mouth 4 (four) times daily as needed for spasms.  11/08/17  Yes Biagio Borg, MD  docusate sodium (COLACE) 100 MG capsule Take 200 mg by mouth 2 (two) times daily as needed for mild constipation.   Yes [provider]  furosemide (LASIX) 40 MG tablet Take 1 tablet (40 mg total) by mouth daily. 03/25/18 03/25/19 Yes Biagio Borg, MD  gabapentin (NEURONTIN) 300 MG capsule Take 1 capsule (300 mg total) 3 (three) times daily by mouth. Patient taking differently: Take 300 mg by mouth 2 (two) times daily.  11/08/17  Yes Biagio Borg, MD  losartan (COZAAR) 50 MG tablet Take 50 mg by mouth daily.  09/29/17  Yes [provider]  ondansetron (ZOFRAN) 4 MG tablet TAKE 1 TABLET BY MOUTH EVERY 8 HOURS AS NEEDED FOR NAUSEA OR VOMITING 04/28/18  Yes Biagio Borg, MD  Oxycodone HCl 10 MG TABS Take 1 tablet (10 mg total) 4 (four) times daily by mouth. Per [facility] 11/08/17  Yes Biagio Borg, MD  pantoprazole (PROTONIX) 40 MG tablet Take 1 tablet (40 mg total) daily by mouth. 11/08/17  Yes Biagio Borg, MD  potassium chloride (KLOR-CON 10) 10 MEQ tablet Take 1 tablet (10 mEq total) by mouth daily. Patient taking differently: Take 10 mEq by mouth every other day. Takes when she takes the 40mg  of lasix 03/25/18  Yes Biagio Borg, MD  sertraline (ZOLOFT) 100  MG tablet TAKE 1 TABLET BY MOUTH EVERY DAY 01/19/18  Yes Biagio Borg, MD  triamcinolone cream (KENALOG) 0.1 % Apply 1 application 2 (two) times daily as needed topically (rash). 11/08/17  Yes Biagio Borg, MD  torsemide (DEMADEX) 10 MG tablet Take 2 tablets (20 mg total) by mouth daily. Patient not taking: Reported on 05/09/2018 03/08/18   Caccavale, Sophia, PA-C  DULoxetine (CYMBALTA) 60 MG capsule Take 60 mg by mouth daily.    03/09/12  [provider]  fexofenadine (ALLEGRA) 180 MG tablet Take 180 mg by mouth daily.    03/09/12  [provider]    Family History Family History  Problem Relation Age of Onset  . Heart disease Mother   . Hypertension Mother   . Diabetes Mother   . Heart failure Mother   .  Asthma Sister   . Hypertension Father   . Asthma Daughter   . Cancer Maternal Uncle        [place]  . Cancer Other        ovarian  . Liver cancer Paternal Grandmother        ????    Social History Social History   Tobacco Use  . Smoking status: Former Smoker    Years: 30.00    Last attempt to quit: 11/08/2008    Years since quitting: 9.5  . Smokeless tobacco: Never Used  . Tobacco comment: quit 10/09  Substance Use Topics  . Alcohol use: No  . Drug use: No     Allergies   Adhesive [tape]; Amoxicillin-pot clavulanate; Codeine; Lactose intolerance (gi); Morphine and related; Vicodin [hydrocodone-acetaminophen]; and Latex   Review of Systems Review of Systems  Constitutional: Negative for chills and fever.  Respiratory: Positive for shortness of breath.   Cardiovascular: Positive for leg swelling (Bilateral). Negative for chest pain.  Gastrointestinal: Positive for abdominal pain and constipation. Negative for nausea and vomiting.  Genitourinary: Negative.   Musculoskeletal: Negative.        Bilateral LE pain  Skin: Negative.  Negative for color change and wound.  Neurological: Negative.  Negative for weakness and headaches.     Physical  Exam Updated Vital Signs BP (!) 122/57 (BP Location: Right Wrist)   Pulse 81   Temp 97.8 F (36.6 C) (Oral)   Resp (!) 22   SpO2 96%   Physical Exam  Constitutional: She is oriented to person, place, and time. She appears well-developed and well-nourished.  HENT:  Head: Normocephalic.  Neck: Normal range of motion. Neck supple.  Cardiovascular: Normal rate and regular rhythm.  No murmur heard. Pulmonary/Chest: Effort normal and breath sounds normal. She has no wheezes. She has no rales. She exhibits no tenderness.  Abdominal: Soft. Bowel sounds are normal. There is no tenderness. There is no rebound and no guarding.  Morbidly obese abdomen, generally tender.  Musculoskeletal: Normal range of motion. She exhibits edema (Marked bilateral LE edema).  Neurological: She is alert and oriented to person, place, and time.  Skin: Skin is warm and dry. No rash noted.  Psychiatric: She has a normal mood and affect.     ED Treatments / Results  Labs (all labs ordered are listed, but only abnormal results are displayed) Labs Reviewed - No data to display  EKG None  Radiology Dg Chest 2 View  Result Date: 05/13/2018 CLINICAL DATA:  Dyspnea and bilateral leg weakness. EXAM: CHEST - 2 VIEW COMPARISON:  03/22/2018 CT and CXR 03/08/2018 FINDINGS: Stable cardiomegaly with minimal aortic atherosclerosis. No acute pulmonary consolidation, effusion or pneumothorax. ACDF of the included lower cervical spine. IMPRESSION: Stable cardiomegaly with minimal aortic atherosclerosis. No active pulmonary disease. Electronically Signed   By: Ashley Royalty M.D.   On: 05/13/2018 19:23    Procedures Procedures (including critical care time)  Medications Ordered in ED Medications  furosemide (LASIX) injection 40 mg (has no administration in time range)  HYDROmorphone (DILAUDID) injection 0.5 mg (has no administration in time range)     Initial Impression / Assessment and Plan / ED Course  I have reviewed  the triage vital signs and the nursing notes.  Pertinent labs & imaging results that were available during my care of the patient were reviewed by me and considered in my medical decision making (see chart for details).     Patient is here with complaint of  abdomina pain that is generalized, and constipation despite stool softener use. She is also concerned about weight gain and increasing LE edema with mild SoB. REcent increase in Lasix is not improving these symptoms.   She has generalized abdominal tenderness. No fever. CT ordered for further evaluation. No leukocytosis, which is reassuring. History of sclerosing mesenteritis.  No h/o CHF. CXR clear of any evidence of edema. 40 mg Lasix provided in the ED via IV and she reports feeling better.   CT scan consistent with mesenteritis. IV solumedrol ordered. Pain has been controlled with small doses of Dilaudid. No vomiting or fever while in the ED. The patient is adamant that she does not want to take steroids at home because she is concerned about weight gain.   CRP and sed rate are essentially normal. Symptoms are mild. She is felt appropriate for discharge home. She reports she is already scheduled for recheck with her primary physician. Strict return precautions discussed.   Final Clinical Impressions(s) / ED Diagnoses   Final diagnoses:  None   1. Sclerosing mesenteritis 2. Peripheral edema  ED Discharge Orders    None       Charlann Lange, PA-C 05/14/18 0537    Charlann Lange, PA-C 05/14/18 [id number]    Tegeler, Gwenyth Allegra, MD 05/14/18 [phone]

## 2018-05-14 ENCOUNTER — Encounter (HOSPITAL_COMMUNITY): Payer: Self-pay

## 2018-05-14 ENCOUNTER — Emergency Department (HOSPITAL_COMMUNITY): Payer: Medicare Other

## 2018-05-14 DIAGNOSIS — R109 Unspecified abdominal pain: Secondary | ICD-10-CM | POA: Diagnosis not present

## 2018-05-14 LAB — BRAIN NATRIURETIC PEPTIDE: B NATRIURETIC PEPTIDE 5: 32.7 pg/mL (ref 0.0–100.0)

## 2018-05-14 LAB — COMPREHENSIVE METABOLIC PANEL
ALK PHOS: 67 U/L (ref 38–126)
ALT: 21 U/L (ref 14–54)
ANION GAP: 11 (ref 5–15)
AST: 23 U/L (ref 15–41)
Albumin: 3.8 g/dL (ref 3.5–5.0)
BILIRUBIN TOTAL: 0.8 mg/dL (ref 0.3–1.2)
BUN: 11 mg/dL (ref 6–20)
CALCIUM: 8.7 mg/dL — AB (ref 8.9–10.3)
CO2: 28 mmol/L (ref 22–32)
Chloride: 102 mmol/L (ref 101–111)
Creatinine, Ser: 0.88 mg/dL (ref 0.44–1.00)
GFR calc Af Amer: >60 mL/min (ref >60)
GFR calc non Af Amer: >60 mL/min (ref >60)
Glucose, Bld: 117 mg/dL — ABNORMAL HIGH (ref 65–99)
Potassium: 3.6 mmol/L (ref 3.5–5.1)
SODIUM: 141 mmol/L (ref 135–145)
TOTAL PROTEIN: 7.3 g/dL (ref 6.5–8.1)

## 2018-05-14 LAB — SEDIMENTATION RATE: Sed Rate: 24 mm/hr — ABNORMAL HIGH (ref 0–22)

## 2018-05-14 LAB — C-REACTIVE PROTEIN

## 2018-05-14 MED ORDER — IOPAMIDOL (ISOVUE-300) INJECTION 61%
100.0000 mL | Freq: Once | INTRAVENOUS | Status: AC | PRN
  Administered 2018-05-14: 100 mL via INTRAVENOUS

## 2018-05-14 MED ORDER — IOPAMIDOL (ISOVUE-300) INJECTION 61%
INTRAVENOUS | Status: AC
  Filled 2018-05-14: qty 100

## 2018-05-14 MED ORDER — METHYLPREDNISOLONE SODIUM SUCC 125 MG IJ SOLR
125.0000 mg | Freq: Once | INTRAMUSCULAR | Status: AC
  Administered 2018-05-14: 125 mg via INTRAVENOUS
  Filled 2018-05-14: qty 2

## 2018-05-14 MED ORDER — HYDROMORPHONE HCL 1 MG/ML IJ SOLN
0.5000 mg | Freq: Once | INTRAMUSCULAR | Status: AC
  Administered 2018-05-14: 0.5 mg via INTRAVENOUS
  Filled 2018-05-14: qty 1

## 2018-05-14 NOTE — Discharge Instructions (Addendum)
Continue your home medications for pain and nausea. You have chosen not to be on steroids at home for treatment of your sclerosing mesenteritis. Please follow up with your doctor for recheck to insure you are getting better. Return here with any severe pain, high fever, new concern.

## 2018-05-17 ENCOUNTER — Other Ambulatory Visit: Payer: Self-pay | Admitting: Internal Medicine

## 2018-05-17 ENCOUNTER — Telehealth: Payer: Self-pay | Admitting: Internal Medicine

## 2018-05-17 NOTE — Telephone Encounter (Signed)
Noted  

## 2018-05-17 NOTE — Telephone Encounter (Signed)
Left patient VM that Dr. [name] Reichmann wanted to see patient for med change.  Patient is scheduled for a 6 month fu on 5/30.  I did leave message that if patient wanted to come in sooner to give our office a call.

## 2018-05-17 NOTE — Telephone Encounter (Signed)
The need for lasix change or how to do it is not clear to me, and theoretically the need for med change should have been addressed at the ED visit  I would have to ask pt to consider ROV if further eval and tx is needed

## 2018-05-17 NOTE — Telephone Encounter (Signed)
George Ina, RN back at [facility]. He informed me that the patient went to the ER Friday and was release Saturday morning. During the time she was at [facility] she was given IV predisone and IV lasix. A CT of chest and abdomen was done that showed "stomach inflammation". When she returned home she was down 9lbs and felt much better. He believes that an adjustment of her Lasix medication is needed since it seems like she is not responding well to it. Please advise.

## 2018-05-17 NOTE — Telephone Encounter (Signed)
Copied from [place] [phone]. Topic: Quick Communication - See Telephone Encounter >> May 17, 2018  9:39 AM Aurelio Brash B wrote: CRM for notification. See Telephone encounter for: 05/17/18. [name] Reichmann , RN  from [facility] wants to give Clin[facility] update    his contact number is [phone] ext [phone]

## 2018-05-18 DIAGNOSIS — K5730 Diverticulosis of large intestine without perforation or abscess without bleeding: Secondary | ICD-10-CM | POA: Diagnosis not present

## 2018-05-18 DIAGNOSIS — M172 Bilateral post-traumatic osteoarthritis of knee: Secondary | ICD-10-CM | POA: Diagnosis not present

## 2018-05-18 DIAGNOSIS — R7303 Prediabetes: Secondary | ICD-10-CM | POA: Diagnosis not present

## 2018-05-18 DIAGNOSIS — K760 Fatty (change of) liver, not elsewhere classified: Secondary | ICD-10-CM | POA: Diagnosis not present

## 2018-05-18 DIAGNOSIS — M48061 Spinal stenosis, lumbar region without neurogenic claudication: Secondary | ICD-10-CM | POA: Diagnosis not present

## 2018-05-18 DIAGNOSIS — G894 Chronic pain syndrome: Secondary | ICD-10-CM | POA: Diagnosis not present

## 2018-05-18 DIAGNOSIS — Z9181 History of falling: Secondary | ICD-10-CM | POA: Diagnosis not present

## 2018-05-18 DIAGNOSIS — Z79891 Long term (current) use of opiate analgesic: Secondary | ICD-10-CM | POA: Diagnosis not present

## 2018-05-18 DIAGNOSIS — Z87891 Personal history of nicotine dependence: Secondary | ICD-10-CM | POA: Diagnosis not present

## 2018-05-18 DIAGNOSIS — M797 Fibromyalgia: Secondary | ICD-10-CM | POA: Diagnosis not present

## 2018-05-18 DIAGNOSIS — I10 Essential (primary) hypertension: Secondary | ICD-10-CM | POA: Diagnosis not present

## 2018-05-18 DIAGNOSIS — I739 Peripheral vascular disease, unspecified: Secondary | ICD-10-CM | POA: Diagnosis not present

## 2018-05-18 DIAGNOSIS — M47816 Spondylosis without myelopathy or radiculopathy, lumbar region: Secondary | ICD-10-CM | POA: Diagnosis not present

## 2018-05-19 ENCOUNTER — Ambulatory Visit (INDEPENDENT_AMBULATORY_CARE_PROVIDER_SITE_OTHER): Payer: Medicare Other | Admitting: Internal Medicine

## 2018-05-19 ENCOUNTER — Other Ambulatory Visit: Payer: Self-pay | Admitting: Internal Medicine

## 2018-05-19 ENCOUNTER — Encounter: Payer: Self-pay | Admitting: Internal Medicine

## 2018-05-19 VITALS — BP 128/86 | HR 87 | Temp 97.9°F | Ht 65.0 in | Wt 345.0 lb

## 2018-05-19 DIAGNOSIS — F411 Generalized anxiety disorder: Secondary | ICD-10-CM | POA: Diagnosis not present

## 2018-05-19 DIAGNOSIS — I10 Essential (primary) hypertension: Secondary | ICD-10-CM | POA: Diagnosis not present

## 2018-05-19 DIAGNOSIS — K654 Sclerosing mesenteritis: Secondary | ICD-10-CM

## 2018-05-19 MED ORDER — LOSARTAN POTASSIUM 50 MG PO TABS: 50.0000 mg | ORAL_TABLET | Freq: Every day | ORAL | 1 refills | Status: DC

## 2018-05-19 NOTE — Progress Notes (Signed)
Subjective:    Patient ID: Maria [name], female    DOB: 17-May-1956, 62 y.o.   MRN: [id number]  HPI  Here after recent ED visit 5/24, had prior gained 7 lbs in 2 days, given IV steroid and IV lasix in ED but declines further steroid, wt down at home now 9 lbs, think scales are correct. GI symptoms resolved - Denies worsening reflux, abd pain, dysphagia, n/v, bowel change or blood.   Pt denies chest pain, increased sob or doe, wheezing, orthopnea, PND, increased LE swelling, palpitations, dizziness or syncope, except did hav a fall walking with her cane in the house with trip and fall.  Pt denies new neurological symptoms such as new headache, or facial or extremity weakness or numbness   Pt denies fever, wt loss, night sweats, loss of appetite, or other constitutional symptoms.  Denies worsening depressive symptoms, suicidal ideation, or panic; has ongoing anxiety Past Medical History:  Diagnosis Date  . Acute lymphadenitis 2011  . ALLERGIC RHINITIS 08/10/2007  . ANXIETY 12/06/2007  . Atherosclerotic peripheral vascular disease ([place]) 06/13/2013   Aorta on CT June 2014  . BACK PAIN 12/06/2007  . Cellulitis 2011  . Cervical disc disease 03/09/2012  . Chronic pain 03/09/2012  . DEPRESSION 12/06/2007  . DIVERTICULOSIS, [place] 12/06/2007  . GERD 12/06/2007  . Hepatitis    age 26 hepatitis A  . HYPERTENSION 12/06/2007  . Impaired glucose tolerance 03/13/2012  . LOW BACK PAIN 12/06/2007  . Lumbar disc disease 03/09/2012  . Mesenteric adenitis   . MRSA 2006  . Pain    by breast area  . Pain in joint, multiple sites 04/21/2010  . PARONYCHIA, FINGER 08/13/2009  . Pneumonia sept 2015   walking pneumonia, sepsis   . RASH-NONVESICULAR 05/09/2008  . 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 SHOULDER PAIN, LEFT 05/09/2008  . SINUSITIS- ACUTE-NOS 05/09/2008  . THORACIC/LUMBOSACRAL NEURITIS/RADICULITIS UNSPEC 12/28/2008   Past Surgical History:  Procedure Laterality Date  . ABDOMINAL HYSTERECTOMY  1999    1 ovary left  . bloo clot removed from neck   may 25th , june 2. 2010  . [place] SURGERY  may 24th 2010  . COLONOSCOPY WITH PROPOFOL N/A 07/10/2016   Procedure: COLONOSCOPY WITH PROPOFOL;  Surgeon: Manus Gunning, MD;  [facility];  Service: Gastroenterology;  Laterality: N/A;  . ESOPHAGOGASTRODUODENOSCOPY (EGD) WITH PROPOFOL N/A 07/10/2016   Procedure: ESOPHAGOGASTRODUODENOSCOPY (EGD) WITH PROPOFOL;  Surgeon: Manus Gunning, MD;  [facility];  Service: Gastroenterology;  Laterality: N/A;  . s/p ovary cyst    . s/p right knee arthroscopy     Dr. Mardelle Matte ortho    reports that she quit smoking about 9 years ago. She quit after 30.00 years of use. She has never used smokeless tobacco. She reports that she does not drink alcohol or use drugs. family history includes Asthma in her daughter and sister; Cancer in her maternal uncle and other; Diabetes in her mother; Heart disease in her mother; Heart failure in her mother; Hypertension in her father and mother; Liver cancer in her paternal grandmother. Allergies  Allergen Reactions  . Adhesive [Tape] Other (See Comments)    Tears skin off - use paper tape   . Amoxicillin-Pot Clavulanate Nausea And Vomiting    Has patient had a PCN reaction causing immediate rash, facial/tongue/throat swelling, SOB or lightheadedness with hypotension: Yes Has patient had a PCN reaction causing severe rash involving mucus membranes or skin necrosis: No Has patient had a PCN  reaction that required hospitalization: No Has patient had a PCN reaction occurring within the last 10 years: No If all of the above answers are "NO", then may proceed with Cephalosporin use.   . Codeine Other (See Comments)    hallucinations  . Lactose Intolerance (Gi) Nausea And Vomiting  . Morphine And Related Nausea And Vomiting and Other (See Comments)    headaches  . Vicodin [Hydrocodone-Acetaminophen] Other (See Comments)    hallucinations    . Latex Rash   Current Outpatient Medications on File Prior to Visit  Medication Sig Dispense Refill  . clonazePAM (KLONOPIN) 1 MG tablet TAKE 1 TABLET BY MOUTH 2 TIMES DAILY AS NEEDED FOR ANXIETY 60 tablet 2  . cyclobenzaprine (FLEXERIL) 10 MG tablet Take 1 tablet (10 mg total) daily as needed by mouth for muscle spasms. (Patient taking differently: Take 10 mg by mouth daily after breakfast. ) 90 tablet 1  . dicyclomine (BENTYL) 10 MG capsule Take 1 capsule (10 mg total) 4 (four) times daily -  before meals and at bedtime by mouth. (Patient taking differently: Take 10 mg by mouth 4 (four) times daily as needed for spasms. ) 120 capsule 2  . docusate sodium (COLACE) 100 MG capsule Take 200 mg by mouth 2 (two) times daily as needed for mild constipation.    . furosemide (LASIX) 40 MG tablet Take 1 tablet (40 mg total) by mouth daily. 90 tablet 3  . gabapentin (NEURONTIN) 300 MG capsule TAKE 1 CAPSULE BY MOUTH 3 TIMES A DAY 90 capsule 0  . ondansetron (ZOFRAN) 4 MG tablet TAKE 1 TABLET BY MOUTH EVERY 8 HOURS AS NEEDED FOR NAUSEA OR VOMITING 40 tablet 0  . Oxycodone HCl 10 MG TABS Take 1 tablet (10 mg total) 4 (four) times daily by mouth. Per [facility] 30 tablet 0  . pantoprazole (PROTONIX) 40 MG tablet Take 1 tablet (40 mg total) daily by mouth. 90 tablet 3  . potassium chloride (KLOR-CON 10) 10 MEQ tablet Take 1 tablet (10 mEq total) by mouth daily. (Patient taking differently: Take 10 mEq by mouth every other day. Takes when she takes the 40mg  of lasix) 90 tablet 3  . sertraline (ZOLOFT) 100 MG tablet TAKE 1 TABLET BY MOUTH EVERY DAY 90 tablet 3  . torsemide (DEMADEX) 10 MG tablet Take 2 tablets (20 mg total) by mouth daily. 30 tablet 0  . triamcinolone cream (KENALOG) 0.1 % Apply 1 application 2 (two) times daily as needed topically (rash). 30 g 11  . [DISCONTINUED] DULoxetine (CYMBALTA) 60 MG capsule Take 60 mg by mouth daily.      . [DISCONTINUED] fexofenadine (ALLEGRA) 180 MG  tablet Take 180 mg by mouth daily.       No current facility-administered medications on file prior to visit.    Review of Systems  Constitutional: Negative for other unusual diaphoresis or sweats HENT: Negative for ear discharge or swelling Eyes: Negative for other worsening visual disturbances Respiratory: Negative for stridor or other swelling  Gastrointestinal: Negative for worsening distension or other blood Genitourinary: Negative for retention or other urinary change Musculoskeletal: Negative for other MSK pain or swelling Skin: Negative for color change or other new lesions Neurological: Negative for worsening tremors and other numbness  Psychiatric/Behavioral: Negative for worsening agitation or other fatigue All other system neg per pt    Objective:   Physical Exam BP 128/86   Pulse 87   Temp 97.9 F (36.6 C) (Oral)   Ht 5\' 5"  (1.651  m)   Wt (!) 345 lb (156.5 kg)   SpO2 95%   BMI 57.41 kg/m  VS noted,  Constitutional: Pt appears in NAD HENT: Head: NCAT.  Right Ear: External ear normal.  Left Ear: External ear normal.  Eyes: . Pupils are equal, round, and reactive to light. Conjunctivae and EOM are normal Nose: without d/c or deformity Neck: Neck supple. Gross normal ROM Cardiovascular: Normal rate and regular rhythm.   Pulmonary/Chest: Effort normal and breath sounds without rales or wheezing.  Abd:  Soft, NT, ND, + BS, no organomegaly Neurological: Pt is alert. At baseline orientation, motor grossly intact Skin: Skin is warm. No rashes, other new lesions, no LE edema Psychiatric: Pt behavior is normal without agitation  No other exam findings    Assessment & Plan:

## 2018-05-19 NOTE — Patient Instructions (Signed)
Ok to take lasix 20 mg in the afternoon for weight gain of 5 lbs or worsening leg swelling  Please continue all other medications as before, and refills have been done if requested.  Please have the pharmacy call with any other refills you may need.  Please keep your appointments with your specialists as you may have planned

## 2018-05-21 NOTE — Assessment & Plan Note (Signed)
stable overall by history and exam, recent data reviewed with pt, and pt to continue medical treatment as before,  to f/u any worsening symptoms or concerns BP Readings from Last 3 Encounters:  05/19/18 128/86  05/14/18 131/62  05/09/18 110/80

## 2018-05-21 NOTE — Assessment & Plan Note (Signed)
Improved,  to f/u any worsening symptoms or concerns 

## 2018-05-21 NOTE — Assessment & Plan Note (Signed)
stable overall by history and exam, and pt to continue medical treatment as before,  to f/u any worsening symptoms or concerns, declines need for counseling referral

## 2018-05-24 DIAGNOSIS — M48061 Spinal stenosis, lumbar region without neurogenic claudication: Secondary | ICD-10-CM | POA: Diagnosis not present

## 2018-05-24 DIAGNOSIS — G894 Chronic pain syndrome: Secondary | ICD-10-CM | POA: Diagnosis not present

## 2018-05-24 DIAGNOSIS — M797 Fibromyalgia: Secondary | ICD-10-CM | POA: Diagnosis not present

## 2018-05-24 DIAGNOSIS — M47816 Spondylosis without myelopathy or radiculopathy, lumbar region: Secondary | ICD-10-CM | POA: Diagnosis not present

## 2018-05-24 DIAGNOSIS — I739 Peripheral vascular disease, unspecified: Secondary | ICD-10-CM | POA: Diagnosis not present

## 2018-05-24 DIAGNOSIS — I10 Essential (primary) hypertension: Secondary | ICD-10-CM | POA: Diagnosis not present

## 2018-05-24 DIAGNOSIS — K5730 Diverticulosis of large intestine without perforation or abscess without bleeding: Secondary | ICD-10-CM | POA: Diagnosis not present

## 2018-05-24 DIAGNOSIS — K760 Fatty (change of) liver, not elsewhere classified: Secondary | ICD-10-CM | POA: Diagnosis not present

## 2018-05-24 DIAGNOSIS — R7303 Prediabetes: Secondary | ICD-10-CM | POA: Diagnosis not present

## 2018-05-24 DIAGNOSIS — Z87891 Personal history of nicotine dependence: Secondary | ICD-10-CM | POA: Diagnosis not present

## 2018-05-24 DIAGNOSIS — Z9181 History of falling: Secondary | ICD-10-CM | POA: Diagnosis not present

## 2018-05-24 DIAGNOSIS — M172 Bilateral post-traumatic osteoarthritis of knee: Secondary | ICD-10-CM | POA: Diagnosis not present

## 2018-05-24 DIAGNOSIS — Z79891 Long term (current) use of opiate analgesic: Secondary | ICD-10-CM | POA: Diagnosis not present

## 2018-05-25 ENCOUNTER — Other Ambulatory Visit: Payer: Self-pay | Admitting: Internal Medicine

## 2018-05-25 DIAGNOSIS — Z79891 Long term (current) use of opiate analgesic: Secondary | ICD-10-CM | POA: Diagnosis not present

## 2018-05-25 DIAGNOSIS — M25512 Pain in left shoulder: Secondary | ICD-10-CM | POA: Diagnosis not present

## 2018-05-25 DIAGNOSIS — M25562 Pain in left knee: Secondary | ICD-10-CM | POA: Diagnosis not present

## 2018-05-25 DIAGNOSIS — G8929 Other chronic pain: Secondary | ICD-10-CM | POA: Diagnosis not present

## 2018-05-25 DIAGNOSIS — M25551 Pain in right hip: Secondary | ICD-10-CM | POA: Diagnosis not present

## 2018-05-25 DIAGNOSIS — M79661 Pain in right lower leg: Secondary | ICD-10-CM | POA: Diagnosis not present

## 2018-05-25 DIAGNOSIS — G894 Chronic pain syndrome: Secondary | ICD-10-CM | POA: Diagnosis not present

## 2018-06-03 DIAGNOSIS — I739 Peripheral vascular disease, unspecified: Secondary | ICD-10-CM | POA: Diagnosis not present

## 2018-06-03 DIAGNOSIS — M797 Fibromyalgia: Secondary | ICD-10-CM | POA: Diagnosis not present

## 2018-06-03 DIAGNOSIS — Z79891 Long term (current) use of opiate analgesic: Secondary | ICD-10-CM | POA: Diagnosis not present

## 2018-06-03 DIAGNOSIS — K5730 Diverticulosis of large intestine without perforation or abscess without bleeding: Secondary | ICD-10-CM | POA: Diagnosis not present

## 2018-06-03 DIAGNOSIS — K760 Fatty (change of) liver, not elsewhere classified: Secondary | ICD-10-CM | POA: Diagnosis not present

## 2018-06-03 DIAGNOSIS — M47816 Spondylosis without myelopathy or radiculopathy, lumbar region: Secondary | ICD-10-CM | POA: Diagnosis not present

## 2018-06-03 DIAGNOSIS — M172 Bilateral post-traumatic osteoarthritis of knee: Secondary | ICD-10-CM | POA: Diagnosis not present

## 2018-06-03 DIAGNOSIS — I10 Essential (primary) hypertension: Secondary | ICD-10-CM | POA: Diagnosis not present

## 2018-06-03 DIAGNOSIS — M48061 Spinal stenosis, lumbar region without neurogenic claudication: Secondary | ICD-10-CM | POA: Diagnosis not present

## 2018-06-03 DIAGNOSIS — R7303 Prediabetes: Secondary | ICD-10-CM | POA: Diagnosis not present

## 2018-06-03 DIAGNOSIS — G894 Chronic pain syndrome: Secondary | ICD-10-CM | POA: Diagnosis not present

## 2018-06-03 DIAGNOSIS — Z87891 Personal history of nicotine dependence: Secondary | ICD-10-CM | POA: Diagnosis not present

## 2018-06-03 DIAGNOSIS — Z9181 History of falling: Secondary | ICD-10-CM | POA: Diagnosis not present

## 2018-06-08 DIAGNOSIS — I739 Peripheral vascular disease, unspecified: Secondary | ICD-10-CM | POA: Diagnosis not present

## 2018-06-08 DIAGNOSIS — Z87891 Personal history of nicotine dependence: Secondary | ICD-10-CM | POA: Diagnosis not present

## 2018-06-08 DIAGNOSIS — Z79891 Long term (current) use of opiate analgesic: Secondary | ICD-10-CM | POA: Diagnosis not present

## 2018-06-08 DIAGNOSIS — I10 Essential (primary) hypertension: Secondary | ICD-10-CM | POA: Diagnosis not present

## 2018-06-08 DIAGNOSIS — K760 Fatty (change of) liver, not elsewhere classified: Secondary | ICD-10-CM | POA: Diagnosis not present

## 2018-06-08 DIAGNOSIS — M797 Fibromyalgia: Secondary | ICD-10-CM | POA: Diagnosis not present

## 2018-06-08 DIAGNOSIS — G894 Chronic pain syndrome: Secondary | ICD-10-CM | POA: Diagnosis not present

## 2018-06-08 DIAGNOSIS — K5730 Diverticulosis of large intestine without perforation or abscess without bleeding: Secondary | ICD-10-CM | POA: Diagnosis not present

## 2018-06-08 DIAGNOSIS — R7303 Prediabetes: Secondary | ICD-10-CM | POA: Diagnosis not present

## 2018-06-08 DIAGNOSIS — Z9181 History of falling: Secondary | ICD-10-CM | POA: Diagnosis not present

## 2018-06-08 DIAGNOSIS — M172 Bilateral post-traumatic osteoarthritis of knee: Secondary | ICD-10-CM | POA: Diagnosis not present

## 2018-06-08 DIAGNOSIS — M48061 Spinal stenosis, lumbar region without neurogenic claudication: Secondary | ICD-10-CM | POA: Diagnosis not present

## 2018-06-08 DIAGNOSIS — M47816 Spondylosis without myelopathy or radiculopathy, lumbar region: Secondary | ICD-10-CM | POA: Diagnosis not present

## 2018-06-13 ENCOUNTER — Encounter: Payer: Self-pay | Admitting: Internal Medicine

## 2018-06-13 ENCOUNTER — Ambulatory Visit (INDEPENDENT_AMBULATORY_CARE_PROVIDER_SITE_OTHER): Payer: Medicare Other | Admitting: Internal Medicine

## 2018-06-13 DIAGNOSIS — J309 Allergic rhinitis, unspecified: Secondary | ICD-10-CM | POA: Diagnosis not present

## 2018-06-13 DIAGNOSIS — I10 Essential (primary) hypertension: Secondary | ICD-10-CM

## 2018-06-13 DIAGNOSIS — K219 Gastro-esophageal reflux disease without esophagitis: Secondary | ICD-10-CM

## 2018-06-13 MED ORDER — RANITIDINE HCL 150 MG PO TABS: 150.0000 mg | ORAL_TABLET | Freq: Every day | ORAL | 3 refills | Status: DC

## 2018-06-13 MED ORDER — TRIAMCINOLONE ACETONIDE 55 MCG/ACT NA AERO: 2.0000 | INHALATION_SPRAY | Freq: Every day | NASAL | 12 refills | Status: DC

## 2018-06-13 NOTE — Patient Instructions (Signed)
.  Please take all new medication as prescribed - the nasacort and zantac at bedtime  Please continue all other medications as before, and refills have been done if requested.  Please have the pharmacy call with any other refills you may need.  Please keep your appointments with your specialists as you may have planned

## 2018-06-13 NOTE — Progress Notes (Signed)
Subjective:    Patient ID: Maria [name], female    DOB: 03-15-1956, 62 y.o.   MRN: [id number]  HPI  Here with c/o increase allergy symptom s- Does have several wks ongoing nasal allergy symptoms with clearish congestion, itch and sneezing with ST, swelling, dry cough, and hoarseness, but without fever, or wheezing.  Pt denies chest pain, increased sob or doe, wheezing, orthopnea, PND, increased LE swelling, palpitations, dizziness or syncope.  Pt denies new neurological symptoms such as new headache, or facial or extremity weakness or numbness   Pt denies polydipsia, polyuria.  Also has had mild worsening reflux, but no abd pain, dysphagia, n/v, bowel change or blood for severa months, worse at night to lie down.   Past Medical History:  Diagnosis Date  . Acute lymphadenitis 2011  . ALLERGIC RHINITIS 08/10/2007  . ANXIETY 12/06/2007  . Atherosclerotic peripheral vascular disease ([place]) 06/13/2013   Aorta on CT June 2014  . BACK PAIN 12/06/2007  . Cellulitis 2011  . Cervical disc disease 03/09/2012  . Chronic pain 03/09/2012  . DEPRESSION 12/06/2007  . DIVERTICULOSIS, [place] 12/06/2007  . GERD 12/06/2007  . Hepatitis    age 33 hepatitis A  . HYPERTENSION 12/06/2007  . Impaired glucose tolerance 03/13/2012  . LOW BACK PAIN 12/06/2007  . Lumbar disc disease 03/09/2012  . Mesenteric adenitis   . MRSA 2006  . Pain    by breast area  . Pain in joint, multiple sites 04/21/2010  . PARONYCHIA, FINGER 08/13/2009  . Pneumonia sept 2015   walking pneumonia, sepsis   . RASH-NONVESICULAR 05/09/2008  . 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 SHOULDER PAIN, LEFT 05/09/2008  . SINUSITIS- ACUTE-NOS 05/09/2008  . THORACIC/LUMBOSACRAL NEURITIS/RADICULITIS UNSPEC 12/28/2008   Past Surgical History:  Procedure Laterality Date  . ABDOMINAL HYSTERECTOMY  1999   1 ovary left  . bloo clot removed from neck   may 25th , june 2. 2010  . [place] SURGERY  may 24th 2010  . COLONOSCOPY WITH PROPOFOL N/A  07/10/2016   Procedure: COLONOSCOPY WITH PROPOFOL;  Surgeon: Manus Gunning, MD;  Location: [facility];  Service: Gastroenterology;  Laterality: N/A;  . ESOPHAGOGASTRODUODENOSCOPY (EGD) WITH PROPOFOL N/A 07/10/2016   Procedure: ESOPHAGOGASTRODUODENOSCOPY (EGD) WITH PROPOFOL;  Surgeon: Manus Gunning, MD;  Location: [facility];  Service: Gastroenterology;  Laterality: N/A;  . s/p ovary cyst    . s/p right knee arthroscopy     Dr. Mardelle Matte ortho    reports that she quit smoking about 9 years ago. She quit after 30.00 years of use. She has never used smokeless tobacco. She reports that she does not drink alcohol or use drugs. family history includes Asthma in her daughter and sister; Cancer in her maternal uncle and other; Diabetes in her mother; Heart disease in her mother; Heart failure in her mother; Hypertension in her father and mother; Liver cancer in her paternal grandmother. Allergies  Allergen Reactions  . Adhesive [Tape] Other (See Comments)    Tears skin off - use paper tape   . Amoxicillin-Pot Clavulanate Nausea And Vomiting    Has patient had a PCN reaction causing immediate rash, facial/tongue/throat swelling, SOB or lightheadedness with hypotension: Yes Has patient had a PCN reaction causing severe rash involving mucus membranes or skin necrosis: No Has patient had a PCN reaction that required hospitalization: No Has patient had a PCN reaction occurring within the last 10 years: No If all of the above answers are "NO", then may proceed with  Cephalosporin use.   . Codeine Other (See Comments)    hallucinations  . Lactose Intolerance (Gi) Nausea And Vomiting  . Morphine And Related Nausea And Vomiting and Other (See Comments)    headaches  . Vicodin [Hydrocodone-Acetaminophen] Other (See Comments)    hallucinations  . Latex Rash   Current Outpatient Medications on File Prior to Visit  Medication Sig Dispense Refill  . clonazePAM (KLONOPIN) 1 MG tablet TAKE 1  TABLET BY MOUTH 2 TIMES DAILY AS NEEDED FOR ANXIETY 60 tablet 2  . cyclobenzaprine (FLEXERIL) 10 MG tablet Take 1 tablet (10 mg total) daily as needed by mouth for muscle spasms. (Patient taking differently: Take 10 mg by mouth daily after breakfast. ) 90 tablet 1  . dicyclomine (BENTYL) 10 MG capsule Take 1 capsule (10 mg total) 4 (four) times daily -  before meals and at bedtime by mouth. (Patient taking differently: Take 10 mg by mouth 4 (four) times daily as needed for spasms. ) 120 capsule 2  . docusate sodium (COLACE) 100 MG capsule Take 200 mg by mouth 2 (two) times daily as needed for mild constipation.    . furosemide (LASIX) 40 MG tablet Take 1 tablet (40 mg total) by mouth daily. 90 tablet 3  . gabapentin (NEURONTIN) 300 MG capsule TAKE 1 CAPSULE BY MOUTH 3 TIMES A DAY 90 capsule 0  . losartan (COZAAR) 50 MG tablet Take 1 tablet (50 mg total) by mouth daily. 90 tablet 1  . ondansetron (ZOFRAN) 4 MG tablet TAKE 1 TABLET BY MOUTH EVERY 8 HOURS AS NEEDED FOR NAUSEA OR VOMITING 40 tablet 0  . Oxycodone HCl 10 MG TABS Take 1 tablet (10 mg total) 4 (four) times daily by mouth. Per [facility] 30 tablet 0  . pantoprazole (PROTONIX) 40 MG tablet Take 1 tablet (40 mg total) daily by mouth. 90 tablet 3  . potassium chloride (KLOR-CON 10) 10 MEQ tablet Take 1 tablet (10 mEq total) by mouth daily. (Patient taking differently: Take 10 mEq by mouth every other day. Takes when she takes the 40mg  of lasix) 90 tablet 3  . sertraline (ZOLOFT) 100 MG tablet TAKE 1 TABLET BY MOUTH EVERY DAY 90 tablet 3  . torsemide (DEMADEX) 10 MG tablet Take 2 tablets (20 mg total) by mouth daily. 30 tablet 0  . triamcinolone cream (KENALOG) 0.1 % Apply 1 application 2 (two) times daily as needed topically (rash). 30 g 11  . [DISCONTINUED] DULoxetine (CYMBALTA) 60 MG capsule Take 60 mg by mouth daily.      . [DISCONTINUED] fexofenadine (ALLEGRA) 180 MG tablet Take 180 mg by mouth daily.       No current  facility-administered medications on file prior to visit.    Review of Systems  Constitutional: Negative for other unusual diaphoresis or sweats HENT: Negative for ear discharge or swelling Eyes: Negative for other worsening visual disturbances Respiratory: Negative for stridor or other swelling  Gastrointestinal: Negative for worsening distension or other blood Genitourinary: Negative for retention or other urinary change Musculoskeletal: Negative for other MSK pain or swelling Skin: Negative for color change or other new lesions Neurological: Negative for worsening tremors and other numbness  Psychiatric/Behavioral: Negative for worsening agitation or other fatigue All other system neg per pt    Objective:   Physical Exam BP 124/84   Pulse 83   Temp 97.6 F (36.4 C) (Oral)   Ht 5\' 5"  (1.651 m)   Wt (!) 350 lb (158.8 kg)   SpO2  93%   BMI 58.24 kg/m  VS noted, not ill appearing Constitutional: Pt appears in NAD HENT: Head: NCAT.  Right Ear: External ear normal.  Left Ear: External ear normal.  Bilat tm's with mild erythema.  Max sinus areas tender.  Pharynx with mild erythema, no exudate Eyes: . Pupils are equal, round, and reactive to light. Conjunctivae and EOM are normal Nose: without d/c or deformity Neck: Neck supple. Gross normal ROM Cardiovascular: Normal rate and regular rhythm.   Pulmonary/Chest: Effort normal and breath sounds without rales or wheezing.  Abd:  Soft, NT, ND, + BS, no organomegaly Neurological: Pt is alert. At baseline orientation, motor grossly intact Skin: Skin is warm. No rashes, other new lesions, no LE edema Psychiatric: Pt behavior is normal without agitation  No other exam findings Lab Results  Component Value Date   WBC 8.1 05/13/2018   HGB 12.7 05/13/2018   HCT 39.0 05/13/2018   PLT 219 05/13/2018   GLUCOSE 117 (H) 05/13/2018   CHOL 201 (H) 11/08/2017   TRIG 183.0 (H) 11/08/2017   HDL 60.10 11/08/2017   LDLDIRECT 96.1 06/13/2013    LDLCALC 104 (H) 11/08/2017   ALT 21 05/13/2018   AST 23 05/13/2018   NA 141 05/13/2018   K 3.6 05/13/2018   CL 102 05/13/2018   CREATININE 0.88 05/13/2018   BUN 11 05/13/2018   CO2 28 05/13/2018   TSH 3.35 11/08/2017   INR 1.11 01/15/2018   HGBA1C 6.0 11/09/2017      Assessment & Plan:

## 2018-06-14 DIAGNOSIS — K5730 Diverticulosis of large intestine without perforation or abscess without bleeding: Secondary | ICD-10-CM | POA: Diagnosis not present

## 2018-06-14 DIAGNOSIS — Z9181 History of falling: Secondary | ICD-10-CM | POA: Diagnosis not present

## 2018-06-14 DIAGNOSIS — I10 Essential (primary) hypertension: Secondary | ICD-10-CM | POA: Diagnosis not present

## 2018-06-14 DIAGNOSIS — I739 Peripheral vascular disease, unspecified: Secondary | ICD-10-CM | POA: Diagnosis not present

## 2018-06-14 DIAGNOSIS — G894 Chronic pain syndrome: Secondary | ICD-10-CM | POA: Diagnosis not present

## 2018-06-14 DIAGNOSIS — K760 Fatty (change of) liver, not elsewhere classified: Secondary | ICD-10-CM | POA: Diagnosis not present

## 2018-06-14 DIAGNOSIS — Z79891 Long term (current) use of opiate analgesic: Secondary | ICD-10-CM | POA: Diagnosis not present

## 2018-06-14 DIAGNOSIS — M47816 Spondylosis without myelopathy or radiculopathy, lumbar region: Secondary | ICD-10-CM | POA: Diagnosis not present

## 2018-06-14 DIAGNOSIS — R7303 Prediabetes: Secondary | ICD-10-CM | POA: Diagnosis not present

## 2018-06-14 DIAGNOSIS — M797 Fibromyalgia: Secondary | ICD-10-CM | POA: Diagnosis not present

## 2018-06-14 DIAGNOSIS — M48061 Spinal stenosis, lumbar region without neurogenic claudication: Secondary | ICD-10-CM | POA: Diagnosis not present

## 2018-06-14 DIAGNOSIS — Z87891 Personal history of nicotine dependence: Secondary | ICD-10-CM | POA: Diagnosis not present

## 2018-06-14 DIAGNOSIS — M172 Bilateral post-traumatic osteoarthritis of knee: Secondary | ICD-10-CM | POA: Diagnosis not present

## 2018-06-15 DIAGNOSIS — J309 Allergic rhinitis, unspecified: Secondary | ICD-10-CM | POA: Insufficient documentation

## 2018-06-15 NOTE — Assessment & Plan Note (Signed)
stable overall by history and exam, recent data reviewed with pt, and pt to continue medical treatment as before,  to f/u any worsening symptoms or concerns BP Readings from Last 3 Encounters:  06/13/18 124/84  05/19/18 128/86  05/14/18 131/62

## 2018-06-15 NOTE — Assessment & Plan Note (Signed)
Ok to add zantac qhs, anti-reflux precautions

## 2018-06-15 NOTE — Assessment & Plan Note (Signed)
Mild to mod, for add nasacort asd,  to f/u any worsening symptoms or concerns 

## 2018-06-22 ENCOUNTER — Emergency Department (HOSPITAL_COMMUNITY)
Admission: EM | Admit: 2018-06-22 | Discharge: 2018-06-22 | Disposition: A | Discharge status: Home / Self Care | Payer: Medicare Other | Attending: Emergency Medicine | Admitting: Emergency Medicine

## 2018-06-22 ENCOUNTER — Encounter (HOSPITAL_COMMUNITY): Payer: Self-pay | Admitting: Obstetrics and Gynecology

## 2018-06-22 ENCOUNTER — Emergency Department (HOSPITAL_COMMUNITY): Payer: Medicare Other

## 2018-06-22 ENCOUNTER — Other Ambulatory Visit: Payer: Self-pay

## 2018-06-22 DIAGNOSIS — S7001XA Contusion of right hip, initial encounter: Secondary | ICD-10-CM | POA: Insufficient documentation

## 2018-06-22 DIAGNOSIS — S60051A Contusion of right little finger without damage to nail, initial encounter: Secondary | ICD-10-CM | POA: Diagnosis not present

## 2018-06-22 DIAGNOSIS — Y92008 Other place in unspecified non-institutional (private) residence as the place of occurrence of the external cause: Secondary | ICD-10-CM | POA: Diagnosis not present

## 2018-06-22 DIAGNOSIS — Y9301 Activity, walking, marching and hiking: Secondary | ICD-10-CM | POA: Insufficient documentation

## 2018-06-22 DIAGNOSIS — M25551 Pain in right hip: Secondary | ICD-10-CM | POA: Diagnosis not present

## 2018-06-22 DIAGNOSIS — Z79899 Other long term (current) drug therapy: Secondary | ICD-10-CM | POA: Diagnosis not present

## 2018-06-22 DIAGNOSIS — S50311A Abrasion of right elbow, initial encounter: Secondary | ICD-10-CM

## 2018-06-22 DIAGNOSIS — S8991XA Unspecified injury of right lower leg, initial encounter: Secondary | ICD-10-CM | POA: Diagnosis present

## 2018-06-22 DIAGNOSIS — S79911A Unspecified injury of right hip, initial encounter: Secondary | ICD-10-CM | POA: Diagnosis not present

## 2018-06-22 DIAGNOSIS — S60419A Abrasion of unspecified finger, initial encounter: Secondary | ICD-10-CM | POA: Diagnosis not present

## 2018-06-22 DIAGNOSIS — S8001XA Contusion of right knee, initial encounter: Secondary | ICD-10-CM | POA: Diagnosis not present

## 2018-06-22 DIAGNOSIS — I10 Essential (primary) hypertension: Secondary | ICD-10-CM | POA: Diagnosis not present

## 2018-06-22 DIAGNOSIS — I959 Hypotension, unspecified: Secondary | ICD-10-CM | POA: Diagnosis not present

## 2018-06-22 DIAGNOSIS — Y998 Other external cause status: Secondary | ICD-10-CM | POA: Diagnosis not present

## 2018-06-22 DIAGNOSIS — Z91040 Latex allergy status: Secondary | ICD-10-CM | POA: Insufficient documentation

## 2018-06-22 DIAGNOSIS — M25561 Pain in right knee: Secondary | ICD-10-CM | POA: Diagnosis not present

## 2018-06-22 DIAGNOSIS — W108XXA Fall (on) (from) other stairs and steps, initial encounter: Secondary | ICD-10-CM | POA: Diagnosis not present

## 2018-06-22 DIAGNOSIS — W19XXXA Unspecified fall, initial encounter: Secondary | ICD-10-CM | POA: Diagnosis not present

## 2018-06-22 DIAGNOSIS — Z87891 Personal history of nicotine dependence: Secondary | ICD-10-CM | POA: Insufficient documentation

## 2018-06-22 DIAGNOSIS — M25521 Pain in right elbow: Secondary | ICD-10-CM | POA: Diagnosis not present

## 2018-06-22 DIAGNOSIS — S80919A Unspecified superficial injury of unspecified knee, initial encounter: Secondary | ICD-10-CM | POA: Diagnosis not present

## 2018-06-22 MED ORDER — HYDROMORPHONE HCL 2 MG/ML IJ SOLN
2.0000 mg | Freq: Once | INTRAMUSCULAR | Status: AC
  Administered 2018-06-22: 2 mg via INTRAMUSCULAR
  Filled 2018-06-22: qty 1

## 2018-06-22 MED ORDER — CLONAZEPAM 0.5 MG PO TABS
1.0000 mg | ORAL_TABLET | Freq: Once | ORAL | Status: AC
  Administered 2018-06-22: 1 mg via ORAL
  Filled 2018-06-22: qty 2

## 2018-06-22 NOTE — Discharge Instructions (Addendum)
Your evaluated in the emergency room for injuries after a fall.  You have a large abrasion on your right elbow and some scrapes and bruising to your right little finger right hip and right knee.  X-rays of this area did not show an obvious fracture.  Used to use ice to the affected areas and will continue your regular medications.  Please follow-up with your doctor and return if any worsening symptoms.

## 2018-06-22 NOTE — ED Notes (Signed)
RN cleaned pt's right elbow, applied a xeroform dressing, wrapped it in gauze, and applied coband. Pt reports it feels better and secure

## 2018-06-22 NOTE — ED Provider Notes (Signed)
[facility] DEPT Provider Note   CSN: [id number] Arrival date & time: 06/22/18  1311     History   Chief Complaint Chief Complaint  Patient presents with  . Knee Injury  . Fall    HPI Maria [name] is a 62 y.o. female.  She is complaining of multiple injuries from a fall while walking out of her apartment.  Is on the concrete steps and got tangled up with her cane and fell down 3 steps onto the concrete outside.  There was no head strike no LOC.  She is been able to ambulate since the injury.  She is complaining of severe pain in her right fifth digit her right hip her right knee and her right elbow.  There is an open abrasion and wound to her right elbow.  Her last shot was within about a year or 2.  She is not on any blood thinners.  There is no numbness no weakness no headache no chest pain no abdominal pain.  She is on chronic narcotics for pain and she states she does not think that giving her oral dose would be enough for her right now.  The history is provided by the patient.  Fall  This is a new problem. The current episode started less than 1 hour ago. The problem occurs constantly. The problem has not changed since onset.Pertinent negatives include no chest pain, no abdominal pain, no headaches and no shortness of breath. The symptoms are aggravated by bending and twisting. Nothing relieves the symptoms. She has tried nothing for the symptoms. The treatment provided no relief.    Past Medical History:  Diagnosis Date  . Acute lymphadenitis 2011  . ALLERGIC RHINITIS 08/10/2007  . ANXIETY 12/06/2007  . Atherosclerotic peripheral vascular disease ([place]) 06/13/2013   Aorta on CT June 2014  . BACK PAIN 12/06/2007  . Cellulitis 2011  . Cervical disc disease 03/09/2012  . Chronic pain 03/09/2012  . DEPRESSION 12/06/2007  . DIVERTICULOSIS, [place] 12/06/2007  . GERD 12/06/2007  . Hepatitis    age 51 hepatitis A  . HYPERTENSION 12/06/2007  . Impaired  glucose tolerance 03/13/2012  . LOW BACK PAIN 12/06/2007  . Lumbar disc disease 03/09/2012  . Mesenteric adenitis   . MRSA 2006  . Pain    by breast area  . Pain in joint, multiple sites 04/21/2010  . PARONYCHIA, FINGER 08/13/2009  . Pneumonia sept 2015   walking pneumonia, sepsis   . RASH-NONVESICULAR 05/09/2008  . 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 SHOULDER PAIN, LEFT 05/09/2008  . SINUSITIS- ACUTE-NOS 05/09/2008  . THORACIC/LUMBOSACRAL NEURITIS/RADICULITIS UNSPEC 12/28/2008    Patient Active Problem List   Diagnosis Date Noted  . Allergic rhinitis 06/15/2018  . Right knee pain 03/21/2018  . OSA (obstructive sleep apnea) 03/21/2018  . Oxygen desaturation 03/21/2018  . Urinary symptom or sign 03/21/2018  . Acquired lymphedema 01/25/2018  . Gait disorder 01/25/2018  . Peripheral edema 01/16/2018  . SOB (shortness of breath) 01/15/2018  . 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 Preventative health care 11/08/2017  . Abdominal pain, epigastric   . Dysphagia   . Ehle cancer screening   . Adverse effect of angiotensin-converting enzyme inhibitor 12/05/2015  . Restless leg 10/08/2015  . Dermatitis, stasis 04/26/2015  . Fibromyalgia 01/31/2015  . Acid reflux 10/17/2014  . 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 Post-traumatic osteoarthritis of both knees 09/19/2014  . Spinal stenosis 09/19/2014  . Cervical disc disease 03/09/2012  . Lumbar  disc disease 03/09/2012  . Chronic pain 03/09/2012  . Obesity 06/17/2011  . Chronic low back pain 12/28/2008  . Anxiety state 12/06/2007  . Depression 12/06/2007  . Essential hypertension 12/06/2007  . LOW BACK PAIN 12/06/2007    Past Surgical History:  Procedure Laterality Date  . ABDOMINAL HYSTERECTOMY  1999   1 ovary left  . bloo clot removed from neck   may 25th , june 2. 2010  . [place] SURGERY  may 24th 2010  . COLONOSCOPY WITH PROPOFOL N/A 07/10/2016   Procedure: COLONOSCOPY WITH PROPOFOL;  Surgeon:  Manus Gunning, MD;  [facility];  Service: Gastroenterology;  Laterality: N/A;  . ESOPHAGOGASTRODUODENOSCOPY (EGD) WITH PROPOFOL N/A 07/10/2016   Procedure: ESOPHAGOGASTRODUODENOSCOPY (EGD) WITH PROPOFOL;  Surgeon: Manus Gunning, MD;  [facility];  Service: Gastroenterology;  Laterality: N/A;  . s/p ovary cyst    . s/p right knee arthroscopy     Dr. Mardelle Matte ortho     OB History   None      Home Medications    Prior to Admission medications   Medication Sig Start Date End Date Taking? Authorizing Provider  clonazePAM (KLONOPIN) 1 MG tablet TAKE 1 TABLET BY MOUTH 2 TIMES DAILY AS NEEDED FOR ANXIETY 04/28/18   Biagio Borg, MD  cyclobenzaprine (FLEXERIL) 10 MG tablet Take 1 tablet (10 mg total) daily as needed by mouth for muscle spasms. Patient taking differently: Take 10 mg by mouth daily after breakfast.  11/08/17   Biagio Borg, MD  dicyclomine (BENTYL) 10 MG capsule Take 1 capsule (10 mg total) 4 (four) times daily -  before meals and at bedtime by mouth. Patient taking differently: Take 10 mg by mouth 4 (four) times daily as needed for spasms.  11/08/17   Biagio Borg, MD  docusate sodium (COLACE) 100 MG capsule Take 200 mg by mouth 2 (two) times daily as needed for mild constipation.    [provider]  furosemide (LASIX) 40 MG tablet Take 1 tablet (40 mg total) by mouth daily. 03/25/18 03/25/19  Biagio Borg, MD  gabapentin (NEURONTIN) 300 MG capsule TAKE 1 CAPSULE BY MOUTH 3 TIMES A DAY 05/26/18   Biagio Borg, MD  losartan (COZAAR) 50 MG tablet Take 1 tablet (50 mg total) by mouth daily. 05/19/18   Biagio Borg, MD  ondansetron (ZOFRAN) 4 MG tablet TAKE 1 TABLET BY MOUTH EVERY 8 HOURS AS NEEDED FOR NAUSEA OR VOMITING 04/28/18   Biagio Borg, MD  Oxycodone HCl 10 MG TABS Take 1 tablet (10 mg total) 4 (four) times daily by mouth. Per [facility] 11/08/17   Biagio Borg, MD  pantoprazole (PROTONIX) 40 MG tablet Take 1 tablet (40 mg  total) daily by mouth. 11/08/17   Biagio Borg, MD  potassium chloride (KLOR-CON 10) 10 MEQ tablet Take 1 tablet (10 mEq total) by mouth daily. Patient taking differently: Take 10 mEq by mouth every other day. Takes when she takes the 40mg  of lasix 03/25/18   Biagio Borg, MD  ranitidine (ZANTAC) 150 MG tablet Take 1 tablet (150 mg total) by mouth at bedtime. 06/13/18   Biagio Borg, MD  sertraline (ZOLOFT) 100 MG tablet TAKE 1 TABLET BY MOUTH EVERY DAY 01/19/18   Biagio Borg, MD  torsemide (DEMADEX) 10 MG tablet Take 2 tablets (20 mg total) by mouth daily. 03/08/18   Caccavale, Sophia, PA-C  triamcinolone (NASACORT) 55 MCG/ACT AERO nasal inhaler Place  2 sprays into the nose daily. 06/13/18   Biagio Borg, MD  triamcinolone cream (KENALOG) 0.1 % Apply 1 application 2 (two) times daily as needed topically (rash). 11/08/17   Biagio Borg, MD  DULoxetine (CYMBALTA) 60 MG capsule Take 60 mg by mouth daily.    03/09/12  [provider]  fexofenadine (ALLEGRA) 180 MG tablet Take 180 mg by mouth daily.    03/09/12  [provider]    Family History Family History  Problem Relation Age of Onset  . Heart disease Mother   . Hypertension Mother   . Diabetes Mother   . Heart failure Mother   . Asthma Sister   . Hypertension Father   . Asthma Daughter   . Cancer Maternal Uncle        [place]  . Cancer Other        ovarian  . Liver cancer Paternal Grandmother        ????    Social History Social History   Tobacco Use  . Smoking status: Former Smoker    Years: 30.00    Last attempt to quit: 11/08/2008    Years since quitting: 9.6  . Smokeless tobacco: Never Used  . Tobacco comment: quit 10/09  Substance Use Topics  . Alcohol use: No  . Drug use: No     Allergies   Adhesive [tape]; Amoxicillin-pot clavulanate; Codeine; Lactose intolerance (gi); Morphine and related; Vicodin [hydrocodone-acetaminophen]; and Latex   Review of Systems Review of Systems  Constitutional:  Negative for fever.  HENT: Negative for sore throat.   Eyes: Negative for visual disturbance.  Respiratory: Negative for shortness of breath.   Cardiovascular: Negative for chest pain.  Gastrointestinal: Negative for abdominal pain.  Genitourinary: Negative for dysuria.  Musculoskeletal: Positive for arthralgias. Negative for neck pain.  Skin: Positive for wound. Negative for rash.  Neurological: Negative for headaches.     Physical Exam Updated Vital Signs BP (!) 133/107 (BP Location: Left Arm)   Pulse 74   Temp 98.1 F (36.7 C) (Oral)   Resp 18   Ht 5\' 5"  (1.651 m)   Wt (!) 158.8 kg (350 lb)   SpO2 99%   BMI 58.24 kg/m   Physical Exam  Constitutional: She is oriented to person, place, and time. She appears well-developed and well-nourished.  HENT:  Head: Normocephalic and atraumatic.  Right Ear: External ear normal.  Left Ear: External ear normal.  Nose: Nose normal.  Mouth/Throat: Oropharynx is clear and moist.  Eyes: Pupils are equal, round, and reactive to light. Conjunctivae and EOM are normal.  Neck: Normal range of motion. Neck supple.  Cardiovascular: Normal rate, regular rhythm, normal heart sounds and intact distal pulses.  Pulmonary/Chest: Effort normal. No stridor. She has no wheezes. She has no rales.  Abdominal: Soft. There is no tenderness.  Musculoskeletal: Normal range of motion.  She is got an abrasion and a deeper laceration to her right elbow.  Lack is about 1 cm.  There is some debris in the wound.  Full range of motion of the elbow.  She has a right fifth digit abrasion and some ecchymosis at her PIP but there is full range of motion.  Her right hip and right knee are diffusely tender but with no deformity and no overlying abrasions or contusions.  She is distal neurovascular intact.  Back and neck are nontender and full range of motion of other joints without deformity.  Neurological: She is alert and oriented to  person, place, and time. She has normal  strength. GCS eye subscore is 4. GCS verbal subscore is 5. GCS motor subscore is 6.  She is moving all extremities 4 x 4 and is ambulated with some assistance to the bathroom.  Skin: Skin is warm and dry. Capillary refill takes less than 2 seconds.  Psychiatric: She has a normal mood and affect.  Nursing note and vitals reviewed.    ED Treatments / Results  Labs (all labs ordered are listed, but only abnormal results are displayed) Labs Reviewed - No data to display  EKG None  Radiology Dg Elbow Complete Right  Result Date: 06/22/2018 CLINICAL DATA:  Right elbow pain secondary to a fall on 06/29 and today. EXAM: RIGHT ELBOW - COMPLETE 3+ VIEW COMPARISON:  None. FINDINGS: There is no evidence of fracture, dislocation, or joint effusion. There is no evidence of arthropathy or other focal bone abnormality. Bandage over the olecranon process. IMPRESSION: No acute bone abnormality. Electronically Signed   By: Lorriane Shire M.D.   On: 06/22/2018 14:38   Dg Knee Complete 4 Views Right  Result Date: 06/22/2018 CLINICAL DATA:  Right knee pain secondary to a fall on 06/18/2018. EXAM: RIGHT KNEE - COMPLETE 4+ VIEW COMPARISON:  Radiographs dated 09/02/2014 FINDINGS: There is slight tricompartmental osteoarthritic changes with marginal osteophytes and slight medial joint space narrowing. Calcifications adjacent to the medial aspect of the knee most likely represent calcification in the distended pes answering bursa as demonstrated on the prior MRI dated 05/06/2011. Suggestion of joint effusion. IMPRESSION: No acute bone abnormalities. Joint effusion. Tricompartmental osteoarthritis. Electronically Signed   By: Lorriane Shire M.D.   On: 06/22/2018 14:37   Dg Finger Little Right  Result Date: 06/22/2018 CLINICAL DATA:  Patient fell on June 29th and has abrasions on the right fifth finger with pain. EXAM: RIGHT LITTLE FINGER 2+V COMPARISON:  None in PACs FINDINGS: The bones of the fifth finger are  subjectively adequately mineralized. The joint spaces are well maintained. There is no acute fracture nor dislocation. There is mild soft tissue fullness over the proximal phalanx. IMPRESSION: There is no acute or significant chronic bony abnormality of the right fifth finger. Electronically Signed   By: David  Martinique M.D.   On: 06/22/2018 14:34   Dg Hip Unilat With Pelvis 2-3 Views Right  Result Date: 06/22/2018 CLINICAL DATA:  Right hip pain secondary falls on 06/18/2018 and today. EXAM: DG HIP (WITH OR WITHOUT PELVIS) 2-3V RIGHT COMPARISON:  None. FINDINGS: There is no evidence of hip fracture or dislocation. There is no evidence of arthropathy or other focal bone abnormality. IMPRESSION: Negative. Electronically Signed   By: Lorriane Shire M.D.   On: 06/22/2018 14:39    Procedures Procedures (including critical care time)  Medications Ordered in ED Medications  HYDROmorphone (DILAUDID) injection 2 mg (has no administration in time range)     Initial Impression / Assessment and Plan / ED Course  I have reviewed the triage vital signs and the nursing notes.  Pertinent labs & imaging results that were available during my care of the patient were reviewed by me and considered in my medical decision making (see chart for details).  Clinical Course as of Jun 23 841  Wed Jun 22, 2018  1449 Updated patient the results of her test.  She understands that she is got to be discharged and will take care of her wounds.  She is asking for an oral dose of her clonazepam as she missed her  home dose.   [MB]    Clinical Course User Index [MB] Hayden Rasmussen, MD     Final Clinical Impressions(s) / ED Diagnoses   Final diagnoses:  Abrasion of right elbow, initial encounter  Contusion of right little finger without damage to nail, initial encounter  Contusion of right knee, initial encounter  Contusion of right hip, initial encounter    ED Discharge Orders    None       Hayden Rasmussen, MD 06/23/18 [id number]

## 2018-06-22 NOTE — ED Notes (Signed)
Bed: WA06 Expected date:  Expected time:  Means of arrival:  Comments: EMS./fall/knee pain

## 2018-06-22 NOTE — ED Triage Notes (Signed)
Pt was walking down her stairs at home and then fell down the stairs. Pt reports she heard a pop. Pt reports she fell down 2 stairs.  Pt is from home. Pt denies LOC, did not hit her head, and is not on blood thinners.  Pt is alert and oriented x4

## 2018-06-23 DIAGNOSIS — M797 Fibromyalgia: Secondary | ICD-10-CM | POA: Diagnosis not present

## 2018-06-23 DIAGNOSIS — R7303 Prediabetes: Secondary | ICD-10-CM | POA: Diagnosis not present

## 2018-06-23 DIAGNOSIS — Z87891 Personal history of nicotine dependence: Secondary | ICD-10-CM | POA: Diagnosis not present

## 2018-06-23 DIAGNOSIS — Z9181 History of falling: Secondary | ICD-10-CM | POA: Diagnosis not present

## 2018-06-23 DIAGNOSIS — M172 Bilateral post-traumatic osteoarthritis of knee: Secondary | ICD-10-CM | POA: Diagnosis not present

## 2018-06-23 DIAGNOSIS — M47816 Spondylosis without myelopathy or radiculopathy, lumbar region: Secondary | ICD-10-CM | POA: Diagnosis not present

## 2018-06-23 DIAGNOSIS — M48061 Spinal stenosis, lumbar region without neurogenic claudication: Secondary | ICD-10-CM | POA: Diagnosis not present

## 2018-06-23 DIAGNOSIS — I739 Peripheral vascular disease, unspecified: Secondary | ICD-10-CM | POA: Diagnosis not present

## 2018-06-23 DIAGNOSIS — Z79891 Long term (current) use of opiate analgesic: Secondary | ICD-10-CM | POA: Diagnosis not present

## 2018-06-23 DIAGNOSIS — K5730 Diverticulosis of large intestine without perforation or abscess without bleeding: Secondary | ICD-10-CM | POA: Diagnosis not present

## 2018-06-23 DIAGNOSIS — I10 Essential (primary) hypertension: Secondary | ICD-10-CM | POA: Diagnosis not present

## 2018-06-23 DIAGNOSIS — G894 Chronic pain syndrome: Secondary | ICD-10-CM | POA: Diagnosis not present

## 2018-06-23 DIAGNOSIS — K760 Fatty (change of) liver, not elsewhere classified: Secondary | ICD-10-CM | POA: Diagnosis not present

## 2018-06-24 ENCOUNTER — Telehealth: Payer: Self-pay | Admitting: Internal Medicine

## 2018-06-24 NOTE — Telephone Encounter (Signed)
Mo has been given the verbal orders for wound care.

## 2018-06-24 NOTE — Telephone Encounter (Signed)
Copied from [place] [id number]. Topic: Quick Communication - See Telephone Encounter >> Jun 24, 2018  2:26 PM Percell Belt A wrote: CRM for notification. See Telephone encounter for: 06/24/18.  MO with [facility] [phone] Pt had a fall Wed the 3rd she would like verbals for wound care from the fall located on her right elbow

## 2018-06-27 DIAGNOSIS — M172 Bilateral post-traumatic osteoarthritis of knee: Secondary | ICD-10-CM | POA: Diagnosis not present

## 2018-06-27 DIAGNOSIS — I739 Peripheral vascular disease, unspecified: Secondary | ICD-10-CM | POA: Diagnosis not present

## 2018-06-27 DIAGNOSIS — M48061 Spinal stenosis, lumbar region without neurogenic claudication: Secondary | ICD-10-CM | POA: Diagnosis not present

## 2018-06-27 DIAGNOSIS — M797 Fibromyalgia: Secondary | ICD-10-CM | POA: Diagnosis not present

## 2018-06-27 DIAGNOSIS — I10 Essential (primary) hypertension: Secondary | ICD-10-CM | POA: Diagnosis not present

## 2018-06-27 DIAGNOSIS — R7303 Prediabetes: Secondary | ICD-10-CM | POA: Diagnosis not present

## 2018-06-27 DIAGNOSIS — M47816 Spondylosis without myelopathy or radiculopathy, lumbar region: Secondary | ICD-10-CM | POA: Diagnosis not present

## 2018-06-27 DIAGNOSIS — K760 Fatty (change of) liver, not elsewhere classified: Secondary | ICD-10-CM | POA: Diagnosis not present

## 2018-06-27 DIAGNOSIS — G894 Chronic pain syndrome: Secondary | ICD-10-CM | POA: Diagnosis not present

## 2018-06-27 DIAGNOSIS — K5730 Diverticulosis of large intestine without perforation or abscess without bleeding: Secondary | ICD-10-CM | POA: Diagnosis not present

## 2018-06-29 DIAGNOSIS — M25512 Pain in left shoulder: Secondary | ICD-10-CM | POA: Diagnosis not present

## 2018-06-29 DIAGNOSIS — Z79891 Long term (current) use of opiate analgesic: Secondary | ICD-10-CM | POA: Diagnosis not present

## 2018-06-29 DIAGNOSIS — G894 Chronic pain syndrome: Secondary | ICD-10-CM | POA: Diagnosis not present

## 2018-06-29 DIAGNOSIS — M542 Cervicalgia: Secondary | ICD-10-CM | POA: Diagnosis not present

## 2018-06-29 DIAGNOSIS — M25552 Pain in left hip: Secondary | ICD-10-CM | POA: Diagnosis not present

## 2018-06-29 DIAGNOSIS — G8929 Other chronic pain: Secondary | ICD-10-CM | POA: Diagnosis not present

## 2018-06-29 DIAGNOSIS — M25561 Pain in right knee: Secondary | ICD-10-CM | POA: Diagnosis not present

## 2018-06-30 DIAGNOSIS — I10 Essential (primary) hypertension: Secondary | ICD-10-CM | POA: Diagnosis not present

## 2018-06-30 DIAGNOSIS — K760 Fatty (change of) liver, not elsewhere classified: Secondary | ICD-10-CM | POA: Diagnosis not present

## 2018-06-30 DIAGNOSIS — R7303 Prediabetes: Secondary | ICD-10-CM | POA: Diagnosis not present

## 2018-06-30 DIAGNOSIS — M172 Bilateral post-traumatic osteoarthritis of knee: Secondary | ICD-10-CM | POA: Diagnosis not present

## 2018-06-30 DIAGNOSIS — M797 Fibromyalgia: Secondary | ICD-10-CM | POA: Diagnosis not present

## 2018-06-30 DIAGNOSIS — M48061 Spinal stenosis, lumbar region without neurogenic claudication: Secondary | ICD-10-CM | POA: Diagnosis not present

## 2018-06-30 DIAGNOSIS — G894 Chronic pain syndrome: Secondary | ICD-10-CM | POA: Diagnosis not present

## 2018-06-30 DIAGNOSIS — K5730 Diverticulosis of large intestine without perforation or abscess without bleeding: Secondary | ICD-10-CM | POA: Diagnosis not present

## 2018-06-30 DIAGNOSIS — I739 Peripheral vascular disease, unspecified: Secondary | ICD-10-CM | POA: Diagnosis not present

## 2018-06-30 DIAGNOSIS — M47816 Spondylosis without myelopathy or radiculopathy, lumbar region: Secondary | ICD-10-CM | POA: Diagnosis not present

## 2018-06-30 DIAGNOSIS — I83218 Varicose veins of right lower extremity with both ulcer of other part of lower extremity and inflammation: Secondary | ICD-10-CM | POA: Diagnosis not present

## 2018-06-30 DIAGNOSIS — S51011A Laceration without foreign body of right elbow, initial encounter: Secondary | ICD-10-CM | POA: Diagnosis not present

## 2018-07-04 ENCOUNTER — Ambulatory Visit: Payer: Medicare Other | Admitting: Internal Medicine

## 2018-07-04 DIAGNOSIS — M172 Bilateral post-traumatic osteoarthritis of knee: Secondary | ICD-10-CM | POA: Diagnosis not present

## 2018-07-04 DIAGNOSIS — I739 Peripheral vascular disease, unspecified: Secondary | ICD-10-CM | POA: Diagnosis not present

## 2018-07-04 DIAGNOSIS — R7303 Prediabetes: Secondary | ICD-10-CM | POA: Diagnosis not present

## 2018-07-04 DIAGNOSIS — K5730 Diverticulosis of large intestine without perforation or abscess without bleeding: Secondary | ICD-10-CM | POA: Diagnosis not present

## 2018-07-04 DIAGNOSIS — G894 Chronic pain syndrome: Secondary | ICD-10-CM | POA: Diagnosis not present

## 2018-07-04 DIAGNOSIS — K760 Fatty (change of) liver, not elsewhere classified: Secondary | ICD-10-CM | POA: Diagnosis not present

## 2018-07-04 DIAGNOSIS — M797 Fibromyalgia: Secondary | ICD-10-CM | POA: Diagnosis not present

## 2018-07-04 DIAGNOSIS — M47816 Spondylosis without myelopathy or radiculopathy, lumbar region: Secondary | ICD-10-CM | POA: Diagnosis not present

## 2018-07-04 DIAGNOSIS — M48061 Spinal stenosis, lumbar region without neurogenic claudication: Secondary | ICD-10-CM | POA: Diagnosis not present

## 2018-07-04 DIAGNOSIS — I10 Essential (primary) hypertension: Secondary | ICD-10-CM | POA: Diagnosis not present

## 2018-07-08 ENCOUNTER — Telehealth: Payer: Self-pay | Admitting: Internal Medicine

## 2018-07-08 DIAGNOSIS — I739 Peripheral vascular disease, unspecified: Secondary | ICD-10-CM | POA: Diagnosis not present

## 2018-07-08 DIAGNOSIS — K760 Fatty (change of) liver, not elsewhere classified: Secondary | ICD-10-CM | POA: Diagnosis not present

## 2018-07-08 DIAGNOSIS — R296 Repeated falls: Secondary | ICD-10-CM

## 2018-07-08 DIAGNOSIS — M47816 Spondylosis without myelopathy or radiculopathy, lumbar region: Secondary | ICD-10-CM | POA: Diagnosis not present

## 2018-07-08 DIAGNOSIS — I10 Essential (primary) hypertension: Secondary | ICD-10-CM | POA: Diagnosis not present

## 2018-07-08 DIAGNOSIS — G894 Chronic pain syndrome: Secondary | ICD-10-CM | POA: Diagnosis not present

## 2018-07-08 DIAGNOSIS — M172 Bilateral post-traumatic osteoarthritis of knee: Secondary | ICD-10-CM | POA: Diagnosis not present

## 2018-07-08 DIAGNOSIS — R269 Unspecified abnormalities of gait and mobility: Secondary | ICD-10-CM

## 2018-07-08 DIAGNOSIS — K5730 Diverticulosis of large intestine without perforation or abscess without bleeding: Secondary | ICD-10-CM | POA: Diagnosis not present

## 2018-07-08 DIAGNOSIS — M797 Fibromyalgia: Secondary | ICD-10-CM | POA: Diagnosis not present

## 2018-07-08 DIAGNOSIS — M48061 Spinal stenosis, lumbar region without neurogenic claudication: Secondary | ICD-10-CM | POA: Diagnosis not present

## 2018-07-08 DIAGNOSIS — R7303 Prediabetes: Secondary | ICD-10-CM | POA: Diagnosis not present

## 2018-07-08 NOTE — Telephone Encounter (Signed)
Copied from [place] [phone]. Topic: Inquiry >> Jul 08, 2018 12:26 PM Mylinda Latina, NT wrote: Reason for CRM: Lattie Haw  (RN) calling from [facility]  states the patient fell early Tuesday morning with no injury. She is wondering if the patient can get a physical therapy referral to evaluate and treat her again. Since this is her 2nd fall in less than 30 days. Please fax the new order. Fax# [phone].

## 2018-07-09 NOTE — Addendum Note (Signed)
Addended by: Biagio Borg on: 07/09/2018 09:29 AM   Modules accepted: Orders

## 2018-07-09 NOTE — Telephone Encounter (Signed)
[place] for referral to [facility] with RN and PT

## 2018-07-14 ENCOUNTER — Ambulatory Visit: Payer: Medicare Other | Admitting: Internal Medicine

## 2018-07-15 DIAGNOSIS — M47816 Spondylosis without myelopathy or radiculopathy, lumbar region: Secondary | ICD-10-CM | POA: Diagnosis not present

## 2018-07-15 DIAGNOSIS — G894 Chronic pain syndrome: Secondary | ICD-10-CM | POA: Diagnosis not present

## 2018-07-15 DIAGNOSIS — K760 Fatty (change of) liver, not elsewhere classified: Secondary | ICD-10-CM | POA: Diagnosis not present

## 2018-07-15 DIAGNOSIS — M797 Fibromyalgia: Secondary | ICD-10-CM | POA: Diagnosis not present

## 2018-07-15 DIAGNOSIS — M48061 Spinal stenosis, lumbar region without neurogenic claudication: Secondary | ICD-10-CM | POA: Diagnosis not present

## 2018-07-15 DIAGNOSIS — I10 Essential (primary) hypertension: Secondary | ICD-10-CM | POA: Diagnosis not present

## 2018-07-15 DIAGNOSIS — K5730 Diverticulosis of large intestine without perforation or abscess without bleeding: Secondary | ICD-10-CM | POA: Diagnosis not present

## 2018-07-15 DIAGNOSIS — I739 Peripheral vascular disease, unspecified: Secondary | ICD-10-CM | POA: Diagnosis not present

## 2018-07-15 DIAGNOSIS — M172 Bilateral post-traumatic osteoarthritis of knee: Secondary | ICD-10-CM | POA: Diagnosis not present

## 2018-07-15 DIAGNOSIS — R7303 Prediabetes: Secondary | ICD-10-CM | POA: Diagnosis not present

## 2018-07-18 DIAGNOSIS — I10 Essential (primary) hypertension: Secondary | ICD-10-CM | POA: Diagnosis not present

## 2018-07-18 DIAGNOSIS — K5730 Diverticulosis of large intestine without perforation or abscess without bleeding: Secondary | ICD-10-CM | POA: Diagnosis not present

## 2018-07-18 DIAGNOSIS — M47816 Spondylosis without myelopathy or radiculopathy, lumbar region: Secondary | ICD-10-CM | POA: Diagnosis not present

## 2018-07-18 DIAGNOSIS — K760 Fatty (change of) liver, not elsewhere classified: Secondary | ICD-10-CM | POA: Diagnosis not present

## 2018-07-18 DIAGNOSIS — R7303 Prediabetes: Secondary | ICD-10-CM | POA: Diagnosis not present

## 2018-07-18 DIAGNOSIS — G894 Chronic pain syndrome: Secondary | ICD-10-CM | POA: Diagnosis not present

## 2018-07-18 DIAGNOSIS — M797 Fibromyalgia: Secondary | ICD-10-CM | POA: Diagnosis not present

## 2018-07-18 DIAGNOSIS — I739 Peripheral vascular disease, unspecified: Secondary | ICD-10-CM | POA: Diagnosis not present

## 2018-07-18 DIAGNOSIS — M172 Bilateral post-traumatic osteoarthritis of knee: Secondary | ICD-10-CM | POA: Diagnosis not present

## 2018-07-18 DIAGNOSIS — M48061 Spinal stenosis, lumbar region without neurogenic claudication: Secondary | ICD-10-CM | POA: Diagnosis not present

## 2018-07-20 ENCOUNTER — Encounter: Payer: Self-pay | Admitting: Internal Medicine

## 2018-07-20 ENCOUNTER — Ambulatory Visit (INDEPENDENT_AMBULATORY_CARE_PROVIDER_SITE_OTHER): Payer: Medicare Other | Admitting: Internal Medicine

## 2018-07-20 VITALS — BP 128/86 | HR 91 | Temp 97.9°F | Ht 65.0 in | Wt 350.0 lb

## 2018-07-20 DIAGNOSIS — M25561 Pain in right knee: Secondary | ICD-10-CM

## 2018-07-20 DIAGNOSIS — Z0000 Encounter for general adult medical examination without abnormal findings: Secondary | ICD-10-CM

## 2018-07-20 DIAGNOSIS — G4733 Obstructive sleep apnea (adult) (pediatric): Secondary | ICD-10-CM | POA: Diagnosis not present

## 2018-07-20 DIAGNOSIS — G8929 Other chronic pain: Secondary | ICD-10-CM

## 2018-07-20 DIAGNOSIS — R252 Cramp and spasm: Secondary | ICD-10-CM | POA: Diagnosis not present

## 2018-07-20 DIAGNOSIS — M797 Fibromyalgia: Secondary | ICD-10-CM

## 2018-07-20 DIAGNOSIS — F411 Generalized anxiety disorder: Secondary | ICD-10-CM

## 2018-07-20 DIAGNOSIS — R2689 Other abnormalities of gait and mobility: Secondary | ICD-10-CM | POA: Diagnosis not present

## 2018-07-20 MED ORDER — SERTRALINE HCL 100 MG PO TABS: 150.0000 mg | ORAL_TABLET | Freq: Every day | ORAL | 3 refills | Status: DC

## 2018-07-20 NOTE — Patient Instructions (Signed)
OK to increase the zoloft to 150 mg per day  You will be contacted regarding the referral for: Neurology  Please see Pain Management for the knees, hips and right shoulder, though you can call for orthopedic referral if this does not work well  Please continue all other medications as before, and refills have been done if requested.  Please have the pharmacy call with any other refills you may need.  Please keep your appointments with your specialists as you may have planned  Please return in 6 months, or sooner if needed, with Lab testing done 3-5 days before

## 2018-07-20 NOTE — Assessment & Plan Note (Signed)
Has tried cymbalta in past unable to tolerate, cont pain management

## 2018-07-20 NOTE — Progress Notes (Signed)
Subjective:    Patient ID: Maria [name], female    DOB: December 21, 1956, 62 y.o.   MRN: [id number]  HPI  Here to fu after recent fall off a concrete step may 3 and struck right elbow, right fifth finger, right lower back, hip and bilat knees hit with abrasion and bleeding.  Still not back to baseline, in that she still has sweling of the hands, as well as stiffness and swelling of the legs.  Gets very stiff if sits too long, and even had marked tightness to the forearms after cleaning the tub, as well as spasm and jumping of the muscles and twitching sort of, hard to type on phone with finger, vision seems blurry  - plans to see eye doctor.  Walks with cane, no other falls.  Gets a pop noise and discomfort to right anterior shoulder with backward movement of the arm.  Pt denies chest pain, increased sob or doe, wheezing, orthopnea, PND, increased LE swelling, palpitations, dizziness or syncope.   Pt denies polydipsia, polyuria    Wt Readings from Last 3 Encounters:  07/20/18 (!) 350 lb (158.8 kg)  06/22/18 (!) 350 lb (158.8 kg)  06/13/18 (!) 350 lb (158.8 kg)   BP Readings from Last 3 Encounters:  07/20/18 128/86  06/22/18 124/61  06/13/18 124/84  Still has Home nurse visiting twice per wk. Still sees pain management - Pt continues to have recurring LBP without change in severity, bowel or bladder change, fever, wt loss,  worsening LE pain/numbness/weakness, gait change or falls. But does have persistent now worsening right shoulder, bilat hip right > left, and bilat knee right > left knee pain.  Past Medical History:  Diagnosis Date  . Acute lymphadenitis 2011  . ALLERGIC RHINITIS 08/10/2007  . ANXIETY 12/06/2007  . Atherosclerotic peripheral vascular disease ([place]) 06/13/2013   Aorta on CT June 2014  . BACK PAIN 12/06/2007  . Cellulitis 2011  . Cervical disc disease 03/09/2012  . Chronic pain 03/09/2012  . DEPRESSION 12/06/2007  . DIVERTICULOSIS, [place] 12/06/2007  . GERD 12/06/2007  .  Hepatitis    age 30 hepatitis A  . HYPERTENSION 12/06/2007  . Impaired glucose tolerance 03/13/2012  . LOW BACK PAIN 12/06/2007  . Lumbar disc disease 03/09/2012  . Mesenteric adenitis   . MRSA 2006  . Pain    by breast area  . Pain in joint, multiple sites 04/21/2010  . PARONYCHIA, FINGER 08/13/2009  . Pneumonia sept 2015   walking pneumonia, sepsis   . RASH-NONVESICULAR 05/09/2008  . 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 SHOULDER PAIN, LEFT 05/09/2008  . SINUSITIS- ACUTE-NOS 05/09/2008  . THORACIC/LUMBOSACRAL NEURITIS/RADICULITIS UNSPEC 12/28/2008   Past Surgical History:  Procedure Laterality Date  . ABDOMINAL HYSTERECTOMY  1999   1 ovary left  . bloo clot removed from neck   may 25th , june 2. 2010  . [place] SURGERY  may 24th 2010  . COLONOSCOPY WITH PROPOFOL N/A 07/10/2016   Procedure: COLONOSCOPY WITH PROPOFOL;  Surgeon: Manus Gunning, MD;  Location: [facility];  Service: Gastroenterology;  Laterality: N/A;  . ESOPHAGOGASTRODUODENOSCOPY (EGD) WITH PROPOFOL N/A 07/10/2016   Procedure: ESOPHAGOGASTRODUODENOSCOPY (EGD) WITH PROPOFOL;  Surgeon: Manus Gunning, MD;  Location: [facility];  Service: Gastroenterology;  Laterality: N/A;  . s/p ovary cyst    . s/p right knee arthroscopy     Dr. Mardelle Matte ortho    reports that she quit smoking about 9 years ago. She quit after 30.00 years of use.  She has never used smokeless tobacco. She reports that she does not drink alcohol or use drugs. family history includes Asthma in her daughter and sister; Cancer in her maternal uncle and other; Diabetes in her mother; Heart disease in her mother; Heart failure in her mother; Hypertension in her father and mother; Liver cancer in her paternal grandmother. Allergies  Allergen Reactions  . Adhesive [Tape] Other (See Comments)    Tears skin off - use paper tape   . Amoxicillin-Pot Clavulanate Nausea And Vomiting    Has patient had a PCN reaction causing immediate rash,  facial/tongue/throat swelling, SOB or lightheadedness with hypotension: Yes Has patient had a PCN reaction causing severe rash involving mucus membranes or skin necrosis: No Has patient had a PCN reaction that required hospitalization: No Has patient had a PCN reaction occurring within the last 10 years: No If all of the above answers are "NO", then may proceed with Cephalosporin use.   . Codeine Other (See Comments)    hallucinations  . Lactose Intolerance (Gi) Nausea And Vomiting  . Morphine And Related Nausea And Vomiting and Other (See Comments)    headaches  . Vicodin [Hydrocodone-Acetaminophen] Other (See Comments)    hallucinations  . Latex Rash   Current Outpatient Medications on File Prior to Visit  Medication Sig Dispense Refill  . clonazePAM (KLONOPIN) 1 MG tablet TAKE 1 TABLET BY MOUTH 2 TIMES DAILY AS NEEDED FOR ANXIETY 60 tablet 2  . cyclobenzaprine (FLEXERIL) 10 MG tablet Take 1 tablet (10 mg total) daily as needed by mouth for muscle spasms. (Patient taking differently: Take 10 mg by mouth daily after breakfast. ) 90 tablet 1  . dicyclomine (BENTYL) 10 MG capsule Take 1 capsule (10 mg total) 4 (four) times daily -  before meals and at bedtime by mouth. (Patient taking differently: Take 10 mg by mouth 4 (four) times daily as needed for spasms. ) 120 capsule 2  . docusate sodium (COLACE) 100 MG capsule Take 200 mg by mouth 2 (two) times daily as needed for mild constipation.    . furosemide (LASIX) 40 MG tablet Take 1 tablet (40 mg total) by mouth daily. 90 tablet 3  . gabapentin (NEURONTIN) 300 MG capsule TAKE 1 CAPSULE BY MOUTH 3 TIMES A DAY 90 capsule 0  . losartan (COZAAR) 50 MG tablet Take 1 tablet (50 mg total) by mouth daily. 90 tablet 1  . ondansetron (ZOFRAN) 4 MG tablet TAKE 1 TABLET BY MOUTH EVERY 8 HOURS AS NEEDED FOR NAUSEA OR VOMITING 40 tablet 0  . Oxycodone HCl 10 MG TABS Take 1 tablet (10 mg total) 4 (four) times daily by mouth. Per [facility] 30  tablet 0  . pantoprazole (PROTONIX) 40 MG tablet Take 1 tablet (40 mg total) daily by mouth. 90 tablet 3  . potassium chloride (KLOR-CON 10) 10 MEQ tablet Take 1 tablet (10 mEq total) by mouth daily. (Patient taking differently: Take 10 mEq by mouth every other day. Takes when she takes the 40mg  of lasix) 90 tablet 3  . ranitidine (ZANTAC) 150 MG tablet Take 1 tablet (150 mg total) by mouth at bedtime. 90 tablet 3  . torsemide (DEMADEX) 10 MG tablet Take 2 tablets (20 mg total) by mouth daily. 30 tablet 0  . triamcinolone (NASACORT) 55 MCG/ACT AERO nasal inhaler Place 2 sprays into the nose daily. 1 Inhaler 12  . triamcinolone cream (KENALOG) 0.1 % Apply 1 application 2 (two) times daily as needed topically (rash). 30 g  11  . [DISCONTINUED] DULoxetine (CYMBALTA) 60 MG capsule Take 60 mg by mouth daily.      . [DISCONTINUED] fexofenadine (ALLEGRA) 180 MG tablet Take 180 mg by mouth daily.       No current facility-administered medications on file prior to visit.    Review of Systems  Constitutional: Negative for other unusual diaphoresis or sweats HENT: Negative for ear discharge or swelling Eyes: Negative for other worsening visual disturbances Respiratory: Negative for stridor or other swelling  Gastrointestinal: Negative for worsening distension or other blood Genitourinary: Negative for retention or other urinary change Musculoskeletal: Negative for other MSK pain or swelling Skin: Negative for color change or other new lesions Neurological: Negative for worsening tremors and other numbness  Psychiatric/Behavioral: Negative for worsening agitation or other fatigue All other system neg per pt    Objective:   Physical Exam BP 128/86   Pulse 91   Temp 97.9 F (36.6 C) (Oral)   Ht 5\' 5"  (1.651 m)   Wt (!) 350 lb (158.8 kg)   SpO2 94%   BMI 58.24 kg/m  VS noted, supermorbid obese Constitutional: Pt appears in NAD HENT: Head: NCAT.  Right Ear: External ear normal.  Left Ear:  External ear normal.  Eyes: . Pupils are equal, round, and reactive to light. Conjunctivae and EOM are normal Nose: without d/c or deformity Neck: Neck supple. Gross normal ROM Cardiovascular: Normal rate and regular rhythm.   Pulmonary/Chest: Effort normal and breath sounds without rales or wheezing.  Abd:  Soft, NT, ND, + BS, no organomegaly Right shoudler with subacromial tender Right hip with mod tender over greater trochanter Left hip with mild to mod tender over greater trochanter Right knee warm with small effusion, crepitus Left knee without effusion but some reduced ROM Has numerous trigger points to the bilat upper and lower back  Neurological: Pt is alert. At baseline orientation, motor grossly intact Skin: Skin is warm. No rashes, other new lesions, no LE edema Psychiatric: Pt behavior is normal without agitation , 2+ nervous No other exam findings  Lab Results  Component Value Date   WBC 8.1 05/13/2018   HGB 12.7 05/13/2018   HCT 39.0 05/13/2018   PLT 219 05/13/2018   GLUCOSE 117 (H) 05/13/2018   CHOL 201 (H) 11/08/2017   TRIG 183.0 (H) 11/08/2017   HDL 60.10 11/08/2017   LDLDIRECT 96.1 06/13/2013   LDLCALC 104 (H) 11/08/2017   ALT 21 05/13/2018   AST 23 05/13/2018   NA 141 05/13/2018   K 3.6 05/13/2018   CL 102 05/13/2018   CREATININE 0.88 05/13/2018   BUN 11 05/13/2018   CO2 28 05/13/2018   TSH 3.35 11/08/2017   INR 1.11 01/15/2018   HGBA1C 6.0 11/09/2017       Assessment & Plan:

## 2018-07-20 NOTE — Assessment & Plan Note (Signed)
Ok to increase the zoloft to 150 qd

## 2018-07-20 NOTE — Assessment & Plan Note (Addendum)
?   Clinical significance, for neurology referral per pt request  Note:  Total time for pt hx, exam, review of record with pt in the room, determination of diagnoses and plan for further eval and tx is > 40 min, with over 50% spent in coordination and counseling of patient including the differential dx, tx, further evaluation and other management of jerking, balance, FMS, anxiety, and right knee pain/other joint pain

## 2018-07-20 NOTE — Assessment & Plan Note (Signed)
Cont walking with cane, avoid further falls

## 2018-07-20 NOTE — Assessment & Plan Note (Signed)
Right knee as well as right shoulder and bilat hips c/w bursitis; for f/u pain management as pt states any cortisone to joints must come from there per contract

## 2018-07-22 ENCOUNTER — Other Ambulatory Visit: Payer: Self-pay | Admitting: *Deleted

## 2018-07-22 DIAGNOSIS — G4733 Obstructive sleep apnea (adult) (pediatric): Secondary | ICD-10-CM

## 2018-07-23 DIAGNOSIS — I739 Peripheral vascular disease, unspecified: Secondary | ICD-10-CM | POA: Diagnosis not present

## 2018-07-23 DIAGNOSIS — M172 Bilateral post-traumatic osteoarthritis of knee: Secondary | ICD-10-CM | POA: Diagnosis not present

## 2018-07-23 DIAGNOSIS — R7303 Prediabetes: Secondary | ICD-10-CM | POA: Diagnosis not present

## 2018-07-23 DIAGNOSIS — K760 Fatty (change of) liver, not elsewhere classified: Secondary | ICD-10-CM | POA: Diagnosis not present

## 2018-07-23 DIAGNOSIS — M48061 Spinal stenosis, lumbar region without neurogenic claudication: Secondary | ICD-10-CM | POA: Diagnosis not present

## 2018-07-23 DIAGNOSIS — G894 Chronic pain syndrome: Secondary | ICD-10-CM | POA: Diagnosis not present

## 2018-07-23 DIAGNOSIS — I10 Essential (primary) hypertension: Secondary | ICD-10-CM | POA: Diagnosis not present

## 2018-07-23 DIAGNOSIS — K5730 Diverticulosis of large intestine without perforation or abscess without bleeding: Secondary | ICD-10-CM | POA: Diagnosis not present

## 2018-07-23 DIAGNOSIS — M47816 Spondylosis without myelopathy or radiculopathy, lumbar region: Secondary | ICD-10-CM | POA: Diagnosis not present

## 2018-07-23 DIAGNOSIS — M797 Fibromyalgia: Secondary | ICD-10-CM | POA: Diagnosis not present

## 2018-07-25 ENCOUNTER — Other Ambulatory Visit: Payer: Self-pay | Admitting: Internal Medicine

## 2018-07-25 ENCOUNTER — Telehealth: Payer: Self-pay | Admitting: Pulmonary Disease

## 2018-07-25 DIAGNOSIS — G894 Chronic pain syndrome: Secondary | ICD-10-CM | POA: Diagnosis not present

## 2018-07-25 DIAGNOSIS — M47816 Spondylosis without myelopathy or radiculopathy, lumbar region: Secondary | ICD-10-CM | POA: Diagnosis not present

## 2018-07-25 DIAGNOSIS — M172 Bilateral post-traumatic osteoarthritis of knee: Secondary | ICD-10-CM | POA: Diagnosis not present

## 2018-07-25 DIAGNOSIS — M48061 Spinal stenosis, lumbar region without neurogenic claudication: Secondary | ICD-10-CM | POA: Diagnosis not present

## 2018-07-25 DIAGNOSIS — I739 Peripheral vascular disease, unspecified: Secondary | ICD-10-CM | POA: Diagnosis not present

## 2018-07-25 DIAGNOSIS — K760 Fatty (change of) liver, not elsewhere classified: Secondary | ICD-10-CM | POA: Diagnosis not present

## 2018-07-25 DIAGNOSIS — M797 Fibromyalgia: Secondary | ICD-10-CM | POA: Diagnosis not present

## 2018-07-25 DIAGNOSIS — I10 Essential (primary) hypertension: Secondary | ICD-10-CM | POA: Diagnosis not present

## 2018-07-25 DIAGNOSIS — R7303 Prediabetes: Secondary | ICD-10-CM | POA: Diagnosis not present

## 2018-07-25 DIAGNOSIS — K5730 Diverticulosis of large intestine without perforation or abscess without bleeding: Secondary | ICD-10-CM | POA: Diagnosis not present

## 2018-07-25 NOTE — Telephone Encounter (Signed)
Done erx x 2

## 2018-07-25 NOTE — Telephone Encounter (Signed)
Per RA, HST showed mild OSA with 7 events per hour. Recommends weight loss but since she is symptomatic, can try auto cpap 5-15cm.

## 2018-07-27 DIAGNOSIS — M542 Cervicalgia: Secondary | ICD-10-CM | POA: Diagnosis not present

## 2018-07-27 DIAGNOSIS — M25552 Pain in left hip: Secondary | ICD-10-CM | POA: Diagnosis not present

## 2018-07-27 DIAGNOSIS — G8929 Other chronic pain: Secondary | ICD-10-CM | POA: Diagnosis not present

## 2018-07-27 DIAGNOSIS — G894 Chronic pain syndrome: Secondary | ICD-10-CM | POA: Diagnosis not present

## 2018-07-27 DIAGNOSIS — M25512 Pain in left shoulder: Secondary | ICD-10-CM | POA: Diagnosis not present

## 2018-07-27 DIAGNOSIS — Z79891 Long term (current) use of opiate analgesic: Secondary | ICD-10-CM | POA: Diagnosis not present

## 2018-07-27 DIAGNOSIS — M25561 Pain in right knee: Secondary | ICD-10-CM | POA: Diagnosis not present

## 2018-07-28 DIAGNOSIS — K760 Fatty (change of) liver, not elsewhere classified: Secondary | ICD-10-CM | POA: Diagnosis not present

## 2018-07-28 DIAGNOSIS — R7303 Prediabetes: Secondary | ICD-10-CM | POA: Diagnosis not present

## 2018-07-28 DIAGNOSIS — M797 Fibromyalgia: Secondary | ICD-10-CM | POA: Diagnosis not present

## 2018-07-28 DIAGNOSIS — I10 Essential (primary) hypertension: Secondary | ICD-10-CM | POA: Diagnosis not present

## 2018-07-28 DIAGNOSIS — G894 Chronic pain syndrome: Secondary | ICD-10-CM | POA: Diagnosis not present

## 2018-07-28 DIAGNOSIS — M48061 Spinal stenosis, lumbar region without neurogenic claudication: Secondary | ICD-10-CM | POA: Diagnosis not present

## 2018-07-28 DIAGNOSIS — M172 Bilateral post-traumatic osteoarthritis of knee: Secondary | ICD-10-CM | POA: Diagnosis not present

## 2018-07-28 DIAGNOSIS — M47816 Spondylosis without myelopathy or radiculopathy, lumbar region: Secondary | ICD-10-CM | POA: Diagnosis not present

## 2018-07-28 DIAGNOSIS — K5730 Diverticulosis of large intestine without perforation or abscess without bleeding: Secondary | ICD-10-CM | POA: Diagnosis not present

## 2018-07-28 DIAGNOSIS — I739 Peripheral vascular disease, unspecified: Secondary | ICD-10-CM | POA: Diagnosis not present

## 2018-07-31 DIAGNOSIS — M797 Fibromyalgia: Secondary | ICD-10-CM | POA: Diagnosis not present

## 2018-07-31 DIAGNOSIS — G894 Chronic pain syndrome: Secondary | ICD-10-CM | POA: Diagnosis not present

## 2018-07-31 DIAGNOSIS — I10 Essential (primary) hypertension: Secondary | ICD-10-CM | POA: Diagnosis not present

## 2018-07-31 DIAGNOSIS — M47816 Spondylosis without myelopathy or radiculopathy, lumbar region: Secondary | ICD-10-CM | POA: Diagnosis not present

## 2018-07-31 DIAGNOSIS — M48061 Spinal stenosis, lumbar region without neurogenic claudication: Secondary | ICD-10-CM | POA: Diagnosis not present

## 2018-07-31 DIAGNOSIS — M172 Bilateral post-traumatic osteoarthritis of knee: Secondary | ICD-10-CM | POA: Diagnosis not present

## 2018-07-31 DIAGNOSIS — K5730 Diverticulosis of large intestine without perforation or abscess without bleeding: Secondary | ICD-10-CM | POA: Diagnosis not present

## 2018-07-31 DIAGNOSIS — R7303 Prediabetes: Secondary | ICD-10-CM | POA: Diagnosis not present

## 2018-07-31 DIAGNOSIS — K760 Fatty (change of) liver, not elsewhere classified: Secondary | ICD-10-CM | POA: Diagnosis not present

## 2018-07-31 DIAGNOSIS — I739 Peripheral vascular disease, unspecified: Secondary | ICD-10-CM | POA: Diagnosis not present

## 2018-08-01 ENCOUNTER — Other Ambulatory Visit: Payer: Self-pay | Admitting: Internal Medicine

## 2018-08-01 ENCOUNTER — Telehealth: Payer: Self-pay | Admitting: Internal Medicine

## 2018-08-01 NOTE — Telephone Encounter (Signed)
Copied from [place] [id number]. Topic: General - Other >> Aug 01, 2018  9:00 AM Keene Breath wrote: Reason for CRM: [place] with [facility] called to give verbal orders for patient:  2x week for 4 weeks effective August 11th.  CB# [phone].

## 2018-08-01 NOTE — Telephone Encounter (Signed)
Verbal orders have been given  

## 2018-08-02 NOTE — Telephone Encounter (Signed)
Spoke with patient-aware of recs from RA-appt made for 02/02/18 at 11:30am to reassess.

## 2018-08-02 NOTE — Telephone Encounter (Signed)
Left message for patient to call back  

## 2018-08-02 NOTE — Telephone Encounter (Signed)
Okay to hold off for now. Please set up follow-up with NP in 6 months to reassess symptoms

## 2018-08-02 NOTE — Telephone Encounter (Signed)
Spoke with patient-aware of results and recs. Pt states she is working on weight loss and would like to bypass setting up CPAP machine. RA please advise. Thanks.

## 2018-08-03 DIAGNOSIS — M172 Bilateral post-traumatic osteoarthritis of knee: Secondary | ICD-10-CM | POA: Diagnosis not present

## 2018-08-03 DIAGNOSIS — M47816 Spondylosis without myelopathy or radiculopathy, lumbar region: Secondary | ICD-10-CM | POA: Diagnosis not present

## 2018-08-03 DIAGNOSIS — I10 Essential (primary) hypertension: Secondary | ICD-10-CM | POA: Diagnosis not present

## 2018-08-03 DIAGNOSIS — K5730 Diverticulosis of large intestine without perforation or abscess without bleeding: Secondary | ICD-10-CM | POA: Diagnosis not present

## 2018-08-03 DIAGNOSIS — M797 Fibromyalgia: Secondary | ICD-10-CM | POA: Diagnosis not present

## 2018-08-03 DIAGNOSIS — K760 Fatty (change of) liver, not elsewhere classified: Secondary | ICD-10-CM | POA: Diagnosis not present

## 2018-08-03 DIAGNOSIS — I739 Peripheral vascular disease, unspecified: Secondary | ICD-10-CM | POA: Diagnosis not present

## 2018-08-03 DIAGNOSIS — R7303 Prediabetes: Secondary | ICD-10-CM | POA: Diagnosis not present

## 2018-08-03 DIAGNOSIS — M48061 Spinal stenosis, lumbar region without neurogenic claudication: Secondary | ICD-10-CM | POA: Diagnosis not present

## 2018-08-03 DIAGNOSIS — G894 Chronic pain syndrome: Secondary | ICD-10-CM | POA: Diagnosis not present

## 2018-08-04 DIAGNOSIS — I739 Peripheral vascular disease, unspecified: Secondary | ICD-10-CM | POA: Diagnosis not present

## 2018-08-04 DIAGNOSIS — K760 Fatty (change of) liver, not elsewhere classified: Secondary | ICD-10-CM | POA: Diagnosis not present

## 2018-08-04 DIAGNOSIS — M48061 Spinal stenosis, lumbar region without neurogenic claudication: Secondary | ICD-10-CM | POA: Diagnosis not present

## 2018-08-04 DIAGNOSIS — M797 Fibromyalgia: Secondary | ICD-10-CM | POA: Diagnosis not present

## 2018-08-04 DIAGNOSIS — M47816 Spondylosis without myelopathy or radiculopathy, lumbar region: Secondary | ICD-10-CM | POA: Diagnosis not present

## 2018-08-04 DIAGNOSIS — M172 Bilateral post-traumatic osteoarthritis of knee: Secondary | ICD-10-CM | POA: Diagnosis not present

## 2018-08-04 DIAGNOSIS — K5730 Diverticulosis of large intestine without perforation or abscess without bleeding: Secondary | ICD-10-CM | POA: Diagnosis not present

## 2018-08-04 DIAGNOSIS — G894 Chronic pain syndrome: Secondary | ICD-10-CM | POA: Diagnosis not present

## 2018-08-04 DIAGNOSIS — R7303 Prediabetes: Secondary | ICD-10-CM | POA: Diagnosis not present

## 2018-08-04 DIAGNOSIS — I10 Essential (primary) hypertension: Secondary | ICD-10-CM | POA: Diagnosis not present

## 2018-08-05 ENCOUNTER — Encounter: Payer: Self-pay | Admitting: Neurology

## 2018-08-08 DIAGNOSIS — R7303 Prediabetes: Secondary | ICD-10-CM | POA: Diagnosis not present

## 2018-08-08 DIAGNOSIS — M797 Fibromyalgia: Secondary | ICD-10-CM | POA: Diagnosis not present

## 2018-08-08 DIAGNOSIS — M172 Bilateral post-traumatic osteoarthritis of knee: Secondary | ICD-10-CM | POA: Diagnosis not present

## 2018-08-08 DIAGNOSIS — K760 Fatty (change of) liver, not elsewhere classified: Secondary | ICD-10-CM | POA: Diagnosis not present

## 2018-08-08 DIAGNOSIS — G894 Chronic pain syndrome: Secondary | ICD-10-CM | POA: Diagnosis not present

## 2018-08-08 DIAGNOSIS — I10 Essential (primary) hypertension: Secondary | ICD-10-CM | POA: Diagnosis not present

## 2018-08-08 DIAGNOSIS — K5730 Diverticulosis of large intestine without perforation or abscess without bleeding: Secondary | ICD-10-CM | POA: Diagnosis not present

## 2018-08-08 DIAGNOSIS — M47816 Spondylosis without myelopathy or radiculopathy, lumbar region: Secondary | ICD-10-CM | POA: Diagnosis not present

## 2018-08-08 DIAGNOSIS — M48061 Spinal stenosis, lumbar region without neurogenic claudication: Secondary | ICD-10-CM | POA: Diagnosis not present

## 2018-08-08 DIAGNOSIS — I739 Peripheral vascular disease, unspecified: Secondary | ICD-10-CM | POA: Diagnosis not present

## 2018-08-11 DIAGNOSIS — M48061 Spinal stenosis, lumbar region without neurogenic claudication: Secondary | ICD-10-CM | POA: Diagnosis not present

## 2018-08-11 DIAGNOSIS — K5730 Diverticulosis of large intestine without perforation or abscess without bleeding: Secondary | ICD-10-CM | POA: Diagnosis not present

## 2018-08-11 DIAGNOSIS — M172 Bilateral post-traumatic osteoarthritis of knee: Secondary | ICD-10-CM | POA: Diagnosis not present

## 2018-08-11 DIAGNOSIS — M47816 Spondylosis without myelopathy or radiculopathy, lumbar region: Secondary | ICD-10-CM | POA: Diagnosis not present

## 2018-08-11 DIAGNOSIS — G894 Chronic pain syndrome: Secondary | ICD-10-CM | POA: Diagnosis not present

## 2018-08-11 DIAGNOSIS — R7303 Prediabetes: Secondary | ICD-10-CM | POA: Diagnosis not present

## 2018-08-11 DIAGNOSIS — I739 Peripheral vascular disease, unspecified: Secondary | ICD-10-CM | POA: Diagnosis not present

## 2018-08-11 DIAGNOSIS — K760 Fatty (change of) liver, not elsewhere classified: Secondary | ICD-10-CM | POA: Diagnosis not present

## 2018-08-11 DIAGNOSIS — M797 Fibromyalgia: Secondary | ICD-10-CM | POA: Diagnosis not present

## 2018-08-11 DIAGNOSIS — I10 Essential (primary) hypertension: Secondary | ICD-10-CM | POA: Diagnosis not present

## 2018-08-15 DIAGNOSIS — Z79891 Long term (current) use of opiate analgesic: Secondary | ICD-10-CM | POA: Diagnosis not present

## 2018-08-15 DIAGNOSIS — Z9181 History of falling: Secondary | ICD-10-CM | POA: Diagnosis not present

## 2018-08-15 DIAGNOSIS — K760 Fatty (change of) liver, not elsewhere classified: Secondary | ICD-10-CM | POA: Diagnosis not present

## 2018-08-15 DIAGNOSIS — M48061 Spinal stenosis, lumbar region without neurogenic claudication: Secondary | ICD-10-CM | POA: Diagnosis not present

## 2018-08-15 DIAGNOSIS — I739 Peripheral vascular disease, unspecified: Secondary | ICD-10-CM | POA: Diagnosis not present

## 2018-08-15 DIAGNOSIS — M797 Fibromyalgia: Secondary | ICD-10-CM | POA: Diagnosis not present

## 2018-08-15 DIAGNOSIS — I10 Essential (primary) hypertension: Secondary | ICD-10-CM | POA: Diagnosis not present

## 2018-08-15 DIAGNOSIS — Z87891 Personal history of nicotine dependence: Secondary | ICD-10-CM | POA: Diagnosis not present

## 2018-08-15 DIAGNOSIS — R7303 Prediabetes: Secondary | ICD-10-CM | POA: Diagnosis not present

## 2018-08-15 DIAGNOSIS — G894 Chronic pain syndrome: Secondary | ICD-10-CM | POA: Diagnosis not present

## 2018-08-15 DIAGNOSIS — M172 Bilateral post-traumatic osteoarthritis of knee: Secondary | ICD-10-CM | POA: Diagnosis not present

## 2018-08-15 DIAGNOSIS — M47816 Spondylosis without myelopathy or radiculopathy, lumbar region: Secondary | ICD-10-CM | POA: Diagnosis not present

## 2018-08-15 DIAGNOSIS — K5730 Diverticulosis of large intestine without perforation or abscess without bleeding: Secondary | ICD-10-CM | POA: Diagnosis not present

## 2018-08-16 DIAGNOSIS — I739 Peripheral vascular disease, unspecified: Secondary | ICD-10-CM | POA: Diagnosis not present

## 2018-08-16 DIAGNOSIS — M48061 Spinal stenosis, lumbar region without neurogenic claudication: Secondary | ICD-10-CM | POA: Diagnosis not present

## 2018-08-16 DIAGNOSIS — K760 Fatty (change of) liver, not elsewhere classified: Secondary | ICD-10-CM | POA: Diagnosis not present

## 2018-08-16 DIAGNOSIS — M47816 Spondylosis without myelopathy or radiculopathy, lumbar region: Secondary | ICD-10-CM | POA: Diagnosis not present

## 2018-08-16 DIAGNOSIS — K5730 Diverticulosis of large intestine without perforation or abscess without bleeding: Secondary | ICD-10-CM | POA: Diagnosis not present

## 2018-08-16 DIAGNOSIS — G894 Chronic pain syndrome: Secondary | ICD-10-CM | POA: Diagnosis not present

## 2018-08-16 DIAGNOSIS — M797 Fibromyalgia: Secondary | ICD-10-CM | POA: Diagnosis not present

## 2018-08-16 DIAGNOSIS — M172 Bilateral post-traumatic osteoarthritis of knee: Secondary | ICD-10-CM | POA: Diagnosis not present

## 2018-08-16 DIAGNOSIS — I10 Essential (primary) hypertension: Secondary | ICD-10-CM | POA: Diagnosis not present

## 2018-08-16 DIAGNOSIS — R7303 Prediabetes: Secondary | ICD-10-CM | POA: Diagnosis not present

## 2018-08-18 DIAGNOSIS — I10 Essential (primary) hypertension: Secondary | ICD-10-CM | POA: Diagnosis not present

## 2018-08-18 DIAGNOSIS — M48061 Spinal stenosis, lumbar region without neurogenic claudication: Secondary | ICD-10-CM | POA: Diagnosis not present

## 2018-08-18 DIAGNOSIS — G894 Chronic pain syndrome: Secondary | ICD-10-CM | POA: Diagnosis not present

## 2018-08-18 DIAGNOSIS — R7303 Prediabetes: Secondary | ICD-10-CM | POA: Diagnosis not present

## 2018-08-18 DIAGNOSIS — K5730 Diverticulosis of large intestine without perforation or abscess without bleeding: Secondary | ICD-10-CM | POA: Diagnosis not present

## 2018-08-18 DIAGNOSIS — M172 Bilateral post-traumatic osteoarthritis of knee: Secondary | ICD-10-CM | POA: Diagnosis not present

## 2018-08-18 DIAGNOSIS — M47816 Spondylosis without myelopathy or radiculopathy, lumbar region: Secondary | ICD-10-CM | POA: Diagnosis not present

## 2018-08-18 DIAGNOSIS — I739 Peripheral vascular disease, unspecified: Secondary | ICD-10-CM | POA: Diagnosis not present

## 2018-08-18 DIAGNOSIS — K760 Fatty (change of) liver, not elsewhere classified: Secondary | ICD-10-CM | POA: Diagnosis not present

## 2018-08-18 DIAGNOSIS — M797 Fibromyalgia: Secondary | ICD-10-CM | POA: Diagnosis not present

## 2018-08-18 NOTE — Progress Notes (Signed)
Maria [name] was seen today in neurologic consultation at the request of Biagio Borg, MD.  The consultation is for the evaluation of "jerking movements."  Pt states that initially it is like tremor but then describes much more jerking movements in the hands and legs.  The records that were made available to me were reviewed.  How long has it been going on? 5 months At rest or with activation?  With activation Fam hx of tremor?  No. Located where?  Knows that R is worse than the left but she is R hand dominant Affected by caffeine:  No. (1 cup coffee in the AM) Affected by alcohol:  Doesn't drink EtOH Affected by stress:  Yes.   Affected by fatigue:  No. Spills soup if on spoon:  Yes.   Affects ADL's (tying shoes, brushing teeth, etc):  Yes.   (buttoning clothes but usually uses pull over shirts)  Has long hx of RLS and she feels like "soda bubbles" are in her legs.  Takes ropinirole prn.  Legs will "jerk" out.  That just started.  Has chronic pain in neck, back and "all over my bodies."  She has been on gabapentin for 7 months.  Last brain neuroimaging:  CT brain 2010 which was normal  PREVIOUS MEDICATIONS: n/a  ALLERGIES:   Allergies  Allergen Reactions  . Adhesive [Tape] Other (See Comments)    Tears skin off - use paper tape   . Amoxicillin-Pot Clavulanate Nausea And Vomiting    Has patient had a PCN reaction causing immediate rash, facial/tongue/throat swelling, SOB or lightheadedness with hypotension: Yes Has patient had a PCN reaction causing severe rash involving mucus membranes or skin necrosis: No Has patient had a PCN reaction that required hospitalization: No Has patient had a PCN reaction occurring within the last 10 years: No If all of the above answers are "NO", then may proceed with Cephalosporin use.   . Codeine Other (See Comments)    hallucinations  . Lactose Intolerance (Gi) Nausea And Vomiting  . Morphine And Related Nausea And Vomiting and Other (See  Comments)    headaches  . Vicodin [Hydrocodone-Acetaminophen] Other (See Comments)    hallucinations  . Latex Rash    CURRENT MEDICATIONS:  Outpatient Encounter Medications as of 08/19/2018  Medication Sig  . clonazePAM (KLONOPIN) 1 MG tablet TAKE 1 TABLET BY MOUTH 2 TIMES DAILY AS NEEDED FOR ANXIETY  . cyclobenzaprine (FLEXERIL) 10 MG tablet TAKE 1 TABLET BY MOUTH 2 TIMES A DAY AS NEEDED FOR MUSCLE SPASMS  . dicyclomine (BENTYL) 10 MG capsule Take 1 capsule (10 mg total) 4 (four) times daily -  before meals and at bedtime by mouth. (Patient taking differently: Take 10 mg by mouth 4 (four) times daily as needed for spasms. )  . docusate sodium (COLACE) 100 MG capsule Take 200 mg by mouth 2 (two) times daily as needed for mild constipation.  . furosemide (LASIX) 40 MG tablet Take 1 tablet (40 mg total) by mouth daily.  Marland Kitchen gabapentin (NEURONTIN) 300 MG capsule TAKE 1 CAPSULE BY MOUTH 3 TIMES A DAY  . losartan (COZAAR) 50 MG tablet Take 1 tablet (50 mg total) by mouth daily.  . ondansetron (ZOFRAN) 4 MG tablet TAKE 1 TABLET BY MOUTH EVERY 8 HOURS AS NEEDED FOR NAUSEA OR VOMITING  . Oxycodone HCl 10 MG TABS Take 1 tablet (10 mg total) 4 (four) times daily by mouth. Per [facility]  . pantoprazole (PROTONIX) 40 MG tablet  Take 1 tablet (40 mg total) daily by mouth.  . potassium chloride (KLOR-CON 10) 10 MEQ tablet Take 1 tablet (10 mEq total) by mouth daily. (Patient taking differently: Take 10 mEq by mouth every other day. Takes when she takes the 40mg  of lasix)  . ranitidine (ZANTAC) 150 MG tablet Take 1 tablet (150 mg total) by mouth at bedtime. (Patient taking differently: Take 150 mg by mouth 2 (two) times daily. )  . sertraline (ZOLOFT) 100 MG tablet Take 1.5 tablets (150 mg total) by mouth daily.  Marland Kitchen triamcinolone (NASACORT) 55 MCG/ACT AERO nasal inhaler Place 2 sprays into the nose daily.  Marland Kitchen triamcinolone cream (KENALOG) 0.1 % Apply 1 application 2 (two) times daily as needed  topically (rash).  . [DISCONTINUED] cyclobenzaprine (FLEXERIL) 10 MG tablet Take 1 tablet (10 mg total) daily as needed by mouth for muscle spasms. (Patient taking differently: Take 10 mg by mouth daily after breakfast. )  . [DISCONTINUED] DULoxetine (CYMBALTA) 60 MG capsule Take 60 mg by mouth daily.    . [DISCONTINUED] fexofenadine (ALLEGRA) 180 MG tablet Take 180 mg by mouth daily.    . [DISCONTINUED] torsemide (DEMADEX) 10 MG tablet Take 2 tablets (20 mg total) by mouth daily.   No facility-administered encounter medications on file as of 08/19/2018.     PAST MEDICAL HISTORY:   Past Medical History:  Diagnosis Date  . Acute lymphadenitis 2011  . ALLERGIC RHINITIS 08/10/2007  . ANXIETY 12/06/2007  . Atherosclerotic peripheral vascular disease ([place]) 06/13/2013   Aorta on CT June 2014  . BACK PAIN 12/06/2007  . Cellulitis 2011  . Cervical disc disease 03/09/2012  . Chronic pain 03/09/2012  . DEPRESSION 12/06/2007  . DIVERTICULOSIS, [place] 12/06/2007  . GERD 12/06/2007  . Hepatitis    age 17 hepatitis A  . HYPERTENSION 12/06/2007  . Impaired glucose tolerance 03/13/2012  . LOW BACK PAIN 12/06/2007  . Lumbar disc disease 03/09/2012  . Mesenteric adenitis   . MRSA 2006  . Pain    by breast area  . Pain in joint, multiple sites 04/21/2010  . PARONYCHIA, FINGER 08/13/2009  . Pneumonia sept 2015   walking pneumonia, sepsis   . RASH-NONVESICULAR 05/09/2008  . 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 SHOULDER PAIN, LEFT 05/09/2008  . SINUSITIS- ACUTE-NOS 05/09/2008  . THORACIC/LUMBOSACRAL NEURITIS/RADICULITIS UNSPEC 12/28/2008    PAST SURGICAL HISTORY:   Past Surgical History:  Procedure Laterality Date  . ABDOMINAL HYSTERECTOMY  1999   1 ovary left  . bloo clot removed from neck   may 25th , june 2. 2010  . [place] SURGERY  may 24th 2010  . COLONOSCOPY WITH PROPOFOL N/A 07/10/2016   Procedure: COLONOSCOPY WITH PROPOFOL;  Surgeon: Manus Gunning, MD;  Location: [facility]  ENDOSCOPY;  Service: Gastroenterology;  Laterality: N/A;  . ESOPHAGOGASTRODUODENOSCOPY (EGD) WITH PROPOFOL N/A 07/10/2016   Procedure: ESOPHAGOGASTRODUODENOSCOPY (EGD) WITH PROPOFOL;  Surgeon: Manus Gunning, MD;  Location: [facility] ENDOSCOPY;  Service: Gastroenterology;  Laterality: N/A;  . s/p ovary cyst    . s/p right knee arthroscopy     Dr. Mardelle Matte ortho    SOCIAL HISTORY:   Social History   Socioeconomic History  . Marital status: Divorced    Spouse name: Not on file  . Number of children: 2  . Years of education: Not on file  . Highest education level: Not on file  Occupational History  . Occupation: Chemical engineer: Forensic scientist  . 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 Financial resource strain: Very hard  . Food insecurity:    Worry: Often true    Inability: Often true  . Transportation needs:    Medical: Yes    Non-medical: Yes  Tobacco Use  . Smoking status: Former Smoker    Years: 30.00    Last attempt to quit: 11/08/2008    Years since quitting: 9.7  . Smokeless tobacco: Never Used  . Tobacco comment: quit 10/09  Substance and Sexual Activity  . Alcohol use: No    Comment: quit drinking 10/07/1997  . Drug use: No  . Sexual activity: Never    Birth control/protection: Surgical  Lifestyle  . Physical activity:    Days per week: 0 days    Minutes per session: 0 min  . Stress: Very much  Relationships  . Social connections:    Talks on phone: Three times a week    Gets together: Three times a week    Attends religious service: More than 4 times per year    Active member of club or organization: Not on file    Attends meetings of clubs or organizations: More than 4 times per year    Relationship status: Divorced  . Intimate partner violence:    Fear of current or ex partner: Not on file    Emotionally abused: Not on file    Physically abused: Not on file    Forced sexual activity: Not on file  Other Topics  Concern  . Not on file  Social History Narrative   Patient has difficulty financially affording food, medication, and affording essential bills. She states she has limited support from her daughter.     FAMILY HISTORY:   Family Status  Relation Name Status  . Mother  Deceased  . Sister 4 Alive       fibromyalgia  . Father  Alive  . Daughter  (Not Specified)  . Mat Uncle  (Not Specified)  . Other grandmother (Not Specified)  . PGM  (Not Specified)  . Brother  Alive  . Child son and daughter,health Alive    ROS:  Review of Systems  HENT: Negative.   Respiratory: Negative.   Cardiovascular: Negative.   Genitourinary:       Occasional urinary incontinence  Musculoskeletal: Positive for falls (missed a step one time and fell on knees) and neck pain.  Skin: Negative.   Neurological: Positive for tingling (paresthesias/"chills" in scalp).    PHYSICAL EXAMINATION:    VITALS:   Vitals:   08/19/18 1326  BP: 132/72  Pulse: 80  SpO2: 95%  Weight: (!) 343 lb (155.6 kg)  Height: 5\' 5"  (1.651 m)    GEN:  Normal appears female in no acute distress.  Appears stated age. HEENT:  Normocephalic, atraumatic. The mucous membranes are moist. The superficial temporal arteries are without ropiness or tenderness. Cardiovascular: Regular rate and rhythm. Lungs: Clear to auscultation bilaterally.  She does have dyspnea on exertion (with ambulation). Neck/Heme: There are no carotid bruits noted bilaterally.  NEUROLOGICAL: Orientation:  The patient is alert and oriented x 3.  Fund of knowledge is appropriate.  Recent and remote memory intact.  Attention span and concentration normal.  Repeats and names without difficulty. Cranial nerves: There is good facial symmetry. The pupils are equal round and reactive to light bilaterally. Fundoscopic exam reveals clear disc margins bilaterally. Extraocular muscles are intact and visual fields are full to confrontational testing. Speech is fluent and  clear. Soft palate  rises symmetrically and there is no tongue deviation. Hearing is intact to conversational tone. Tone: Tone is good throughout. Sensation: Sensation is intact to light touch and pinprick throughout (facial, trunk, extremities). Vibration is intact at the bilateral big toe. There is no extinction with double simultaneous stimulation. There is no sensory dermatomal level identified. Coordination:  The patient has no difficulty with RAM's or FNF bilaterally.   Motor: Strength is 5/5 in the bilateral upper and left lower extremities. There is give way weakness due to pain in the LLE (proximally) and this is better with encouragement.   Shoulder shrug is equal and symmetric. There is no pronator drift.  There are no fasciculations noted. DTR's: Deep tendon reflexes are 2/4 at the bilateral biceps, triceps, brachioradialis, patella and 1/4 at the bilateral achilles.  Plantar responses are downgoing bilaterally. Gait and Station: The patient pushes off of the chair to arise.  She is wide-based.  Her gait is antalgic. Abnormal movements:  There is no asterixis.  No rest tremor.  No intention tremor.  No trouble with Archimedes spirals.  No trouble with pouring water from one glass to another.    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  However, she is describing this somewhat.  Discussed with the patient that  I have found that gabapentin is a very frequent cause of this.  She thinks that this started about the same time that she was started on gabapentin, but is not sure.  I told her to talk to her primary care physician to see if perhaps she can get off of the medication.  She did not think it was particularly helpful for her restless leg anyway.  2.  Balance change  -Think that this is likely multifactorial.  Do not see a single neurodegenerative process that would cause this.  I do think that with medications (particularly pain medicines and clonazepam) along with deconditioning and body habitus contribute.  3.  F/u prn.  Much greater than 50% of this visit was spent in counseling and coordinating care.  Total face to face time:  45 min   Cc:  Biagio Borg, MD

## 2018-08-19 ENCOUNTER — Encounter: Payer: Self-pay | Admitting: Neurology

## 2018-08-19 ENCOUNTER — Ambulatory Visit (INDEPENDENT_AMBULATORY_CARE_PROVIDER_SITE_OTHER): Payer: Medicare Other | Admitting: Neurology

## 2018-08-19 VITALS — BP 132/72 | HR 80 | Ht 65.0 in | Wt 343.0 lb

## 2018-08-19 DIAGNOSIS — R2689 Other abnormalities of gait and mobility: Secondary | ICD-10-CM

## 2018-08-19 DIAGNOSIS — G253 Myoclonus: Secondary | ICD-10-CM | POA: Diagnosis not present

## 2018-08-19 NOTE — Patient Instructions (Signed)
Follow up with Dr. [name] Reichmann about the gabapentin  Good to see you today!

## 2018-08-22 DIAGNOSIS — K760 Fatty (change of) liver, not elsewhere classified: Secondary | ICD-10-CM | POA: Diagnosis not present

## 2018-08-22 DIAGNOSIS — M797 Fibromyalgia: Secondary | ICD-10-CM | POA: Diagnosis not present

## 2018-08-22 DIAGNOSIS — R7303 Prediabetes: Secondary | ICD-10-CM | POA: Diagnosis not present

## 2018-08-22 DIAGNOSIS — G894 Chronic pain syndrome: Secondary | ICD-10-CM | POA: Diagnosis not present

## 2018-08-22 DIAGNOSIS — I10 Essential (primary) hypertension: Secondary | ICD-10-CM | POA: Diagnosis not present

## 2018-08-22 DIAGNOSIS — M47816 Spondylosis without myelopathy or radiculopathy, lumbar region: Secondary | ICD-10-CM | POA: Diagnosis not present

## 2018-08-22 DIAGNOSIS — M48061 Spinal stenosis, lumbar region without neurogenic claudication: Secondary | ICD-10-CM | POA: Diagnosis not present

## 2018-08-22 DIAGNOSIS — I739 Peripheral vascular disease, unspecified: Secondary | ICD-10-CM | POA: Diagnosis not present

## 2018-08-22 DIAGNOSIS — K5730 Diverticulosis of large intestine without perforation or abscess without bleeding: Secondary | ICD-10-CM | POA: Diagnosis not present

## 2018-08-22 DIAGNOSIS — M172 Bilateral post-traumatic osteoarthritis of knee: Secondary | ICD-10-CM | POA: Diagnosis not present

## 2018-08-24 DIAGNOSIS — M797 Fibromyalgia: Secondary | ICD-10-CM | POA: Diagnosis not present

## 2018-08-24 DIAGNOSIS — R7303 Prediabetes: Secondary | ICD-10-CM | POA: Diagnosis not present

## 2018-08-24 DIAGNOSIS — I10 Essential (primary) hypertension: Secondary | ICD-10-CM | POA: Diagnosis not present

## 2018-08-24 DIAGNOSIS — M172 Bilateral post-traumatic osteoarthritis of knee: Secondary | ICD-10-CM | POA: Diagnosis not present

## 2018-08-24 DIAGNOSIS — K5730 Diverticulosis of large intestine without perforation or abscess without bleeding: Secondary | ICD-10-CM | POA: Diagnosis not present

## 2018-08-24 DIAGNOSIS — I739 Peripheral vascular disease, unspecified: Secondary | ICD-10-CM | POA: Diagnosis not present

## 2018-08-24 DIAGNOSIS — G894 Chronic pain syndrome: Secondary | ICD-10-CM | POA: Diagnosis not present

## 2018-08-24 DIAGNOSIS — M48061 Spinal stenosis, lumbar region without neurogenic claudication: Secondary | ICD-10-CM | POA: Diagnosis not present

## 2018-08-24 DIAGNOSIS — K760 Fatty (change of) liver, not elsewhere classified: Secondary | ICD-10-CM | POA: Diagnosis not present

## 2018-08-24 DIAGNOSIS — M47816 Spondylosis without myelopathy or radiculopathy, lumbar region: Secondary | ICD-10-CM | POA: Diagnosis not present

## 2018-08-25 DIAGNOSIS — R7303 Prediabetes: Secondary | ICD-10-CM | POA: Diagnosis not present

## 2018-08-25 DIAGNOSIS — M542 Cervicalgia: Secondary | ICD-10-CM | POA: Diagnosis not present

## 2018-08-25 DIAGNOSIS — M48061 Spinal stenosis, lumbar region without neurogenic claudication: Secondary | ICD-10-CM | POA: Diagnosis not present

## 2018-08-25 DIAGNOSIS — M25552 Pain in left hip: Secondary | ICD-10-CM | POA: Diagnosis not present

## 2018-08-25 DIAGNOSIS — Z79891 Long term (current) use of opiate analgesic: Secondary | ICD-10-CM | POA: Diagnosis not present

## 2018-08-25 DIAGNOSIS — G8929 Other chronic pain: Secondary | ICD-10-CM | POA: Diagnosis not present

## 2018-08-25 DIAGNOSIS — I739 Peripheral vascular disease, unspecified: Secondary | ICD-10-CM | POA: Diagnosis not present

## 2018-08-25 DIAGNOSIS — G894 Chronic pain syndrome: Secondary | ICD-10-CM | POA: Diagnosis not present

## 2018-08-25 DIAGNOSIS — K760 Fatty (change of) liver, not elsewhere classified: Secondary | ICD-10-CM | POA: Diagnosis not present

## 2018-08-25 DIAGNOSIS — M25561 Pain in right knee: Secondary | ICD-10-CM | POA: Diagnosis not present

## 2018-08-25 DIAGNOSIS — M47816 Spondylosis without myelopathy or radiculopathy, lumbar region: Secondary | ICD-10-CM | POA: Diagnosis not present

## 2018-08-25 DIAGNOSIS — I10 Essential (primary) hypertension: Secondary | ICD-10-CM | POA: Diagnosis not present

## 2018-08-25 DIAGNOSIS — M797 Fibromyalgia: Secondary | ICD-10-CM | POA: Diagnosis not present

## 2018-08-25 DIAGNOSIS — K5730 Diverticulosis of large intestine without perforation or abscess without bleeding: Secondary | ICD-10-CM | POA: Diagnosis not present

## 2018-08-25 DIAGNOSIS — M25512 Pain in left shoulder: Secondary | ICD-10-CM | POA: Diagnosis not present

## 2018-08-25 DIAGNOSIS — M172 Bilateral post-traumatic osteoarthritis of knee: Secondary | ICD-10-CM | POA: Diagnosis not present

## 2018-08-29 DIAGNOSIS — I739 Peripheral vascular disease, unspecified: Secondary | ICD-10-CM | POA: Diagnosis not present

## 2018-08-29 DIAGNOSIS — M172 Bilateral post-traumatic osteoarthritis of knee: Secondary | ICD-10-CM | POA: Diagnosis not present

## 2018-08-29 DIAGNOSIS — I10 Essential (primary) hypertension: Secondary | ICD-10-CM | POA: Diagnosis not present

## 2018-08-29 DIAGNOSIS — K760 Fatty (change of) liver, not elsewhere classified: Secondary | ICD-10-CM | POA: Diagnosis not present

## 2018-08-29 DIAGNOSIS — Z79891 Long term (current) use of opiate analgesic: Secondary | ICD-10-CM | POA: Diagnosis not present

## 2018-08-29 DIAGNOSIS — G894 Chronic pain syndrome: Secondary | ICD-10-CM | POA: Diagnosis not present

## 2018-08-29 DIAGNOSIS — K5730 Diverticulosis of large intestine without perforation or abscess without bleeding: Secondary | ICD-10-CM | POA: Diagnosis not present

## 2018-08-29 DIAGNOSIS — M47816 Spondylosis without myelopathy or radiculopathy, lumbar region: Secondary | ICD-10-CM | POA: Diagnosis not present

## 2018-08-29 DIAGNOSIS — Z87891 Personal history of nicotine dependence: Secondary | ICD-10-CM | POA: Diagnosis not present

## 2018-08-29 DIAGNOSIS — R7303 Prediabetes: Secondary | ICD-10-CM | POA: Diagnosis not present

## 2018-08-29 DIAGNOSIS — M48061 Spinal stenosis, lumbar region without neurogenic claudication: Secondary | ICD-10-CM | POA: Diagnosis not present

## 2018-08-29 DIAGNOSIS — Z9181 History of falling: Secondary | ICD-10-CM | POA: Diagnosis not present

## 2018-08-29 DIAGNOSIS — M797 Fibromyalgia: Secondary | ICD-10-CM | POA: Diagnosis not present

## 2018-08-31 DIAGNOSIS — F419 Anxiety disorder, unspecified: Secondary | ICD-10-CM

## 2018-08-31 DIAGNOSIS — I739 Peripheral vascular disease, unspecified: Secondary | ICD-10-CM | POA: Diagnosis not present

## 2018-08-31 DIAGNOSIS — M172 Bilateral post-traumatic osteoarthritis of knee: Secondary | ICD-10-CM

## 2018-08-31 DIAGNOSIS — M47816 Spondylosis without myelopathy or radiculopathy, lumbar region: Secondary | ICD-10-CM

## 2018-08-31 DIAGNOSIS — M48061 Spinal stenosis, lumbar region without neurogenic claudication: Secondary | ICD-10-CM | POA: Diagnosis not present

## 2018-08-31 DIAGNOSIS — I10 Essential (primary) hypertension: Secondary | ICD-10-CM | POA: Diagnosis not present

## 2018-08-31 DIAGNOSIS — F321 Major depressive disorder, single episode, moderate: Secondary | ICD-10-CM | POA: Diagnosis not present

## 2018-08-31 DIAGNOSIS — G894 Chronic pain syndrome: Secondary | ICD-10-CM

## 2018-08-31 DIAGNOSIS — K5730 Diverticulosis of large intestine without perforation or abscess without bleeding: Secondary | ICD-10-CM

## 2018-08-31 DIAGNOSIS — K760 Fatty (change of) liver, not elsewhere classified: Secondary | ICD-10-CM

## 2018-08-31 DIAGNOSIS — M797 Fibromyalgia: Secondary | ICD-10-CM

## 2018-08-31 DIAGNOSIS — R7303 Prediabetes: Secondary | ICD-10-CM

## 2018-08-31 DIAGNOSIS — Z9181 History of falling: Secondary | ICD-10-CM

## 2018-08-31 DIAGNOSIS — Z87891 Personal history of nicotine dependence: Secondary | ICD-10-CM

## 2018-08-31 DIAGNOSIS — Z79891 Long term (current) use of opiate analgesic: Secondary | ICD-10-CM

## 2018-09-01 DIAGNOSIS — I739 Peripheral vascular disease, unspecified: Secondary | ICD-10-CM | POA: Diagnosis not present

## 2018-09-01 DIAGNOSIS — Z9181 History of falling: Secondary | ICD-10-CM | POA: Diagnosis not present

## 2018-09-01 DIAGNOSIS — M797 Fibromyalgia: Secondary | ICD-10-CM | POA: Diagnosis not present

## 2018-09-01 DIAGNOSIS — K760 Fatty (change of) liver, not elsewhere classified: Secondary | ICD-10-CM | POA: Diagnosis not present

## 2018-09-01 DIAGNOSIS — K5730 Diverticulosis of large intestine without perforation or abscess without bleeding: Secondary | ICD-10-CM | POA: Diagnosis not present

## 2018-09-01 DIAGNOSIS — M172 Bilateral post-traumatic osteoarthritis of knee: Secondary | ICD-10-CM | POA: Diagnosis not present

## 2018-09-01 DIAGNOSIS — I10 Essential (primary) hypertension: Secondary | ICD-10-CM | POA: Diagnosis not present

## 2018-09-01 DIAGNOSIS — M47816 Spondylosis without myelopathy or radiculopathy, lumbar region: Secondary | ICD-10-CM | POA: Diagnosis not present

## 2018-09-01 DIAGNOSIS — Z87891 Personal history of nicotine dependence: Secondary | ICD-10-CM | POA: Diagnosis not present

## 2018-09-01 DIAGNOSIS — M48061 Spinal stenosis, lumbar region without neurogenic claudication: Secondary | ICD-10-CM | POA: Diagnosis not present

## 2018-09-01 DIAGNOSIS — Z79891 Long term (current) use of opiate analgesic: Secondary | ICD-10-CM | POA: Diagnosis not present

## 2018-09-01 DIAGNOSIS — R7303 Prediabetes: Secondary | ICD-10-CM | POA: Diagnosis not present

## 2018-09-01 DIAGNOSIS — G894 Chronic pain syndrome: Secondary | ICD-10-CM | POA: Diagnosis not present

## 2018-09-20 ENCOUNTER — Ambulatory Visit: Payer: Medicare Other | Admitting: Internal Medicine

## 2018-09-21 DIAGNOSIS — G8929 Other chronic pain: Secondary | ICD-10-CM | POA: Diagnosis not present

## 2018-09-21 DIAGNOSIS — G894 Chronic pain syndrome: Secondary | ICD-10-CM | POA: Diagnosis not present

## 2018-09-21 DIAGNOSIS — M25512 Pain in left shoulder: Secondary | ICD-10-CM | POA: Diagnosis not present

## 2018-09-21 DIAGNOSIS — M25561 Pain in right knee: Secondary | ICD-10-CM | POA: Diagnosis not present

## 2018-09-21 DIAGNOSIS — Z79891 Long term (current) use of opiate analgesic: Secondary | ICD-10-CM | POA: Diagnosis not present

## 2018-09-21 DIAGNOSIS — M542 Cervicalgia: Secondary | ICD-10-CM | POA: Diagnosis not present

## 2018-09-21 DIAGNOSIS — M25552 Pain in left hip: Secondary | ICD-10-CM | POA: Diagnosis not present

## 2018-10-20 ENCOUNTER — Other Ambulatory Visit: Payer: Self-pay | Admitting: Internal Medicine

## 2018-10-24 ENCOUNTER — Other Ambulatory Visit: Payer: Self-pay | Admitting: Internal Medicine

## 2018-10-25 NOTE — Telephone Encounter (Signed)
   LOV:07/20/18 NextOV:01/25/19 Last Filled/Quantity:09/22/18 60#

## 2018-10-26 DIAGNOSIS — G8929 Other chronic pain: Secondary | ICD-10-CM | POA: Diagnosis not present

## 2018-10-26 DIAGNOSIS — G894 Chronic pain syndrome: Secondary | ICD-10-CM | POA: Diagnosis not present

## 2018-10-26 DIAGNOSIS — M25561 Pain in right knee: Secondary | ICD-10-CM | POA: Diagnosis not present

## 2018-10-26 DIAGNOSIS — M25512 Pain in left shoulder: Secondary | ICD-10-CM | POA: Diagnosis not present

## 2018-10-26 DIAGNOSIS — M542 Cervicalgia: Secondary | ICD-10-CM | POA: Diagnosis not present

## 2018-10-26 DIAGNOSIS — Z79891 Long term (current) use of opiate analgesic: Secondary | ICD-10-CM | POA: Diagnosis not present

## 2018-10-26 DIAGNOSIS — M25552 Pain in left hip: Secondary | ICD-10-CM | POA: Diagnosis not present

## 2018-11-21 ENCOUNTER — Other Ambulatory Visit: Payer: Self-pay | Admitting: Internal Medicine

## 2018-11-23 DIAGNOSIS — Z79891 Long term (current) use of opiate analgesic: Secondary | ICD-10-CM | POA: Diagnosis not present

## 2018-11-23 DIAGNOSIS — G8929 Other chronic pain: Secondary | ICD-10-CM | POA: Diagnosis not present

## 2018-11-23 DIAGNOSIS — M25561 Pain in right knee: Secondary | ICD-10-CM | POA: Diagnosis not present

## 2018-11-23 DIAGNOSIS — M542 Cervicalgia: Secondary | ICD-10-CM | POA: Diagnosis not present

## 2018-11-23 DIAGNOSIS — M25512 Pain in left shoulder: Secondary | ICD-10-CM | POA: Diagnosis not present

## 2018-11-23 DIAGNOSIS — M25552 Pain in left hip: Secondary | ICD-10-CM | POA: Diagnosis not present

## 2018-11-23 DIAGNOSIS — G894 Chronic pain syndrome: Secondary | ICD-10-CM | POA: Diagnosis not present

## 2018-11-25 ENCOUNTER — Other Ambulatory Visit: Payer: Self-pay | Admitting: Internal Medicine

## 2018-12-23 DIAGNOSIS — M25552 Pain in left hip: Secondary | ICD-10-CM | POA: Diagnosis not present

## 2018-12-23 DIAGNOSIS — G894 Chronic pain syndrome: Secondary | ICD-10-CM | POA: Diagnosis not present

## 2018-12-23 DIAGNOSIS — M545 Low back pain: Secondary | ICD-10-CM | POA: Diagnosis not present

## 2018-12-23 DIAGNOSIS — M542 Cervicalgia: Secondary | ICD-10-CM | POA: Diagnosis not present

## 2018-12-23 DIAGNOSIS — G8929 Other chronic pain: Secondary | ICD-10-CM | POA: Diagnosis not present

## 2018-12-23 DIAGNOSIS — M25561 Pain in right knee: Secondary | ICD-10-CM | POA: Diagnosis not present

## 2018-12-23 DIAGNOSIS — Z79891 Long term (current) use of opiate analgesic: Secondary | ICD-10-CM | POA: Diagnosis not present

## 2018-12-27 ENCOUNTER — Other Ambulatory Visit: Payer: Self-pay | Admitting: Internal Medicine

## 2018-12-29 NOTE — Progress Notes (Addendum)
Subjective:   Maria [name] is a 63 y.o. female who presents for Medicare Annual (Subsequent) preventive examination.  Patient stated that she has not felt well for several weeks. Informed nurse that her B/P at home has been elevated, c/o intermittent palpitations,  LUQ abdominal pain, headaches and overall malaise feeling.   Review of Systems:  No ROS.  Medicare Wellness Visit. Additional risk factors are reflected in the social history.  Cardiac Risk Factors include: advanced age (>28men, >80 women);hypertension;sedentary lifestyle;obesity (BMI >30kg/m2) Sleep patterns: gets up 2 times nightly to void and sleeps 7-8 hours nightly.    Home Safety/Smoke Alarms: Feels safe in home. Smoke alarms in place.  Living environment; residence and Firearm Safety: apartment, no firearms. Lives alone, no needs for DME, limited support system Seat Belt Safety/Bike Helmet: Wears seat belt.     Objective:     Vitals: BP (!) 154/78   Pulse 76   Resp 18   Ht 5\' 5"  (1.651 m)   Wt (!) 340 lb (154.2 kg)   SpO2 98%   BMI 56.58 kg/m   Body mass index is 56.58 kg/m.  Advanced Directives 12/30/2018 03/08/2018 01/15/2018 12/29/2017 03/16/2017 02/07/2017 09/07/2016  Does Patient Have a Medical Advance Directive? No No Yes No No No No  Type of Advance Directive - - [place]  Does patient want to make changes to medical advance directive? Yes (ED - Information included in AVS) - - Yes (ED - Information included in AVS) - - -  Copy of Healthcare Power of Attorney in Chart? - - No - copy requested - - - -  Would patient like information on creating a medical advance directive? - - - - Yes (ED - Information included in AVS) - Yes - Educational materials given  Pre-existing out of facility DNR order (yellow form or pink MOST form) - - - - - - -    Tobacco Social History   Tobacco Use  Smoking Status Former Smoker  . Years: 30.00  . Last attempt to quit: 11/08/2008  . Years  since quitting: 10.1  Smokeless Tobacco Never Used  Tobacco Comment   quit 10/09     Counseling given: Not Answered Comment: quit 10/09  Past Medical History:  Diagnosis Date  . Acute lymphadenitis 2011  . ALLERGIC RHINITIS 08/10/2007  . ANXIETY 12/06/2007  . Atherosclerotic peripheral vascular disease ([place]) 06/13/2013   Aorta on CT June 2014  . BACK PAIN 12/06/2007  . Cellulitis 2011  . Cervical disc disease 03/09/2012  . Chronic pain 03/09/2012  . DEPRESSION 12/06/2007  . DIVERTICULOSIS, [place] 12/06/2007  . GERD 12/06/2007  . Hepatitis    age 38 hepatitis A  . HYPERTENSION 12/06/2007  . Impaired glucose tolerance 03/13/2012  . LOW BACK PAIN 12/06/2007  . Lumbar disc disease 03/09/2012  . Mesenteric adenitis   . MRSA 2006  . Pain    by breast area  . Pain in joint, multiple sites 04/21/2010  . PARONYCHIA, FINGER 08/13/2009  . Pneumonia sept 2015   walking pneumonia, sepsis   . RASH-NONVESICULAR 05/09/2008  . 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 SHOULDER PAIN, LEFT 05/09/2008  . SINUSITIS- ACUTE-NOS 05/09/2008  . THORACIC/LUMBOSACRAL NEURITIS/RADICULITIS UNSPEC 12/28/2008   Past Surgical History:  Procedure Laterality Date  . ABDOMINAL HYSTERECTOMY  1999   1 ovary left  . bloo clot removed from neck   may 25th , june 2. 2010  . CERVICAL DISC SURGERY  may  24th 2010  . COLONOSCOPY WITH PROPOFOL N/A 07/10/2016   Procedure: COLONOSCOPY WITH PROPOFOL;  Surgeon: Manus Gunning, MD;  Location: [facility];  Service: Gastroenterology;  Laterality: N/A;  . ESOPHAGOGASTRODUODENOSCOPY (EGD) WITH PROPOFOL N/A 07/10/2016   Procedure: ESOPHAGOGASTRODUODENOSCOPY (EGD) WITH PROPOFOL;  Surgeon: Manus Gunning, MD;  Location: [facility];  Service: Gastroenterology;  Laterality: N/A;  . s/p ovary cyst    . s/p right knee arthroscopy     Dr. Mardelle Matte ortho   Family History  Problem Relation Age of Onset  . Heart disease Mother   . Hypertension Mother   . Diabetes Mother    . Heart failure Mother   . Asthma Sister   . Hypertension Father   . Asthma Daughter   . Cancer Maternal Uncle        [place]  . Cancer Other        ovarian  . Liver cancer Paternal Grandmother        ????   Social History   Socioeconomic History  . Marital status: Divorced    Spouse name: Not on file  . Number of children: 2  . Years of education: Not on file  . Highest education level: Not on file  Occupational History  . Occupation: disabled  Social Needs  . Financial resource strain: Hard  . Food insecurity:    Worry: Sometimes true    Inability: Sometimes true  . Transportation needs:    Medical: Yes    Non-medical: Yes  Tobacco Use  . Smoking status: Former Smoker    Years: 30.00    Last attempt to quit: 11/08/2008    Years since quitting: 10.1  . Smokeless tobacco: Never Used  . Tobacco comment: quit 10/09  Substance and Sexual Activity  . Alcohol use: No    Comment: quit drinking 10/07/1997  . Drug use: No  . Sexual activity: Never    Birth control/protection: Surgical, Abstinence  Lifestyle  . Physical activity:    Days per week: 0 days    Minutes per session: 0 min  . Stress: To some extent  Relationships  . Social connections:    Talks on phone: More than three times a week    Gets together: Twice a week    Attends religious service: More than 4 times per year    Active member of club or organization: Yes    Attends meetings of clubs or organizations: More than 4 times per year    Relationship status: Divorced  Other Topics Concern  . Not on file  Social History Narrative   Patient has difficulty financially affording food, medication, and affording essential bills. She states she has limited support from her daughter.     Outpatient Encounter Medications as of 12/30/2018  Medication Sig  . clonazePAM (KLONOPIN) 1 MG tablet TAKE 1 TABLET BY MOUTH 2 TIMES DAILY AS NEEDED FOR ANXIETY  . cyclobenzaprine (FLEXERIL) 10 MG tablet TAKE 1 TABLET BY  MOUTH 2 TIMES A DAY AS NEEDED FOR MUSCLE SPASMS  . dicyclomine (BENTYL) 10 MG capsule Take 1 capsule (10 mg total) 4 (four) times daily -  before meals and at bedtime by mouth. (Patient taking differently: Take 10 mg by mouth 4 (four) times daily as needed for spasms. )  . docusate sodium (COLACE) 100 MG capsule Take 200 mg by mouth 2 (two) times daily as needed for mild constipation.  . furosemide (LASIX) 40 MG tablet TAKE 1 TABLET BY MOUTH DAILY  . losartan (  COZAAR) 50 MG tablet TAKE 1 TABLET BY MOUTH EVERY DAY  . ondansetron (ZOFRAN) 4 MG tablet TAKE 1 TABLET BY MOUTH EVERY 8 HOURS AS NEEDED FOR NAUSEA OR VOMITING  . Oxycodone HCl 10 MG TABS Take 1 tablet (10 mg total) 4 (four) times daily by mouth. Per [facility]  . pantoprazole (PROTONIX) 40 MG tablet TAKE 1 TABLET BY MOUTH EVERY DAY  . potassium chloride (K-DUR) 10 MEQ tablet TAKE 1 TABLET BY MOUTH DAILY  . sertraline (ZOLOFT) 100 MG tablet Take 1.5 tablets (150 mg total) by mouth daily.  Marland Kitchen triamcinolone (NASACORT) 55 MCG/ACT AERO nasal inhaler Place 2 sprays into the nose daily.  Marland Kitchen triamcinolone cream (KENALOG) 0.1 % Apply 1 application 2 (two) times daily as needed topically (rash).  . [DISCONTINUED] DULoxetine (CYMBALTA) 60 MG capsule Take 60 mg by mouth daily.    . [DISCONTINUED] fexofenadine (ALLEGRA) 180 MG tablet Take 180 mg by mouth daily.    . [DISCONTINUED] gabapentin (NEURONTIN) 300 MG capsule TAKE 1 CAPSULE BY MOUTH 3 TIMES A DAY (Patient not taking: Reported on 12/30/2018)  . [DISCONTINUED] ranitidine (ZANTAC) 150 MG tablet Take 1 tablet (150 mg total) by mouth at bedtime. (Patient not taking: Reported on 12/30/2018)   No facility-administered encounter medications on file as of 12/30/2018.     Activities of Daily Living In your present state of health, do you have any difficulty performing the following activities: 12/30/2018  Hearing? N  Vision? N  Difficulty concentrating or making decisions? N  Walking or  climbing stairs? Y  Dressing or bathing? N  Doing errands, shopping? Y  Preparing Food and eating ? N  Using the Toilet? N  In the past six months, have you accidently leaked urine? Y  Do you have problems with loss of bowel control? N  Managing your Medications? N  Managing your Finances? N  Housekeeping or managing your Housekeeping? N  Some recent data might be hidden    Patient Care Team: Biagio Borg, MD as PCP - General (Internal Medicine) Armbruster, Carlota Raspberry, MD as Consulting Physician (Gastroenterology) Helayne Seminole, MD as Consulting Physician (Otolaryngology) Artis Delay, MD as Referring Physician (Internal Medicine)    Assessment:   This is a routine wellness examination for [place]. Physical assessment deferred to PCP.   Exercise Activities and Dietary recommendations Current Exercise Habits: Home exercise routine, Time (Minutes): 30, Frequency (Times/Week): 4, Weekly Exercise (Minutes/Week): 120, Intensity: Mild, Exercise limited by: Other - see comments;orthopedic condition(s)(morbid obesity) Diet (meal preparation, eat out, water intake, caffeinated beverages, dairy products, fruits and vegetables): in general, a "healthy" diet     Reviewed heart healthy diet. Discussed weight loss strategies and diet. Encouraged patient to increase daily water and healthy fluid intake. Relevant patient education assigned to patient using Emmi.  Goals    . Patient Stated     Start to monitor diet to decrease carbohydrates, sugar and fat. Start to be more active by doing chair exercises and walking.     . Patient Stated     I want to continue to crochet and play magong. Do chair exercises and eat healthy.       Fall Risk Fall Risk  12/30/2018 08/19/2018 12/29/2017 11/08/2017 06/02/2013  Falls in the past year? 1 Yes Yes Yes No  Number falls in past yr: 1 2 or more 2 or more 2 or more -  Comment - - - falls off bed with nightmares -  Injury with Fall? 1 No  No No -  Risk  Factor Category  - High Fall Risk High Fall Risk - -  Risk for fall due to : Impaired balance/gait;Impaired mobility - Impaired mobility;Impaired balance/gait - -  Follow up Falls prevention discussed;Education provided Falls evaluation completed Falls prevention discussed - -   Depression Screen PHQ 2/9 Scores 12/30/2018 12/29/2017 11/08/2017 11/08/2017  PHQ - 2 Score 3 3 1 2   PHQ- 9 Score 7 10 - -     Cognitive Function MMSE - Mini Mental State Exam 12/29/2017  Orientation to time 5  Orientation to Place 5  Registration 3  Attention/ Calculation 5  Recall 2  Language- name 2 objects 2  Language- repeat 1  Language- follow 3 step command 3  Language- read & follow direction 1  Write a sentence 1  Copy design 1  Total score 29       Ad8 score reviewed for issues:  Issues making decisions: no  Less interest in hobbies / activities: no  Repeats questions, stories (family complaining): no  Trouble using ordinary gadgets (microwave, computer, phone):no  Forgets the month or year: no  Mismanaging finances: no  Remembering appts: no  Daily problems with thinking and/or memory: no Ad8 score is= 0  Immunization History  Administered Date(s) Administered  . Hepatitis B, ped/adol 06/03/2016, 07/17/2016  . Influenza Split 10/13/2012  . Influenza,inj,Quad PF,6+ Mos 08/31/2013, 11/08/2017, 12/30/2018  . Pneumococcal Polysaccharide-23 08/18/2013  . Td 12/21/2004  . Tdap 11/08/2017   Screening Tests Health Maintenance  Topic Date Due  . MAMMOGRAM  03/22/2019  . COLONOSCOPY  07/10/2026  . TETANUS/TDAP  11/09/2027  . INFLUENZA VACCINE  Completed  . Hepatitis C Screening  Completed  . HIV Screening  Completed      Plan:     An appointment was made with PCP on 01/04/19 to address patients c/o high B/P at home, headaches, intermittent palpitations, LUQ abdominal pain, and overall malaise feeling. Patient declined scheduling an appointment to see other providers today  stating she could not wait for the schedule time or return back to the facility. Education was provided for patient to call 911 if systems worsen before 01/04/19 visit and patient verbalized understanding.  Continue to eat heart healthy diet (full of fruits, vegetables, whole grains, lean protein, water--limit salt, fat, and sugar intake) and increase physical activity as tolerated.  Health maintenance screenings were reviewed with patient today,  influenza vaccine given, and relevant education provided.   Continue doing brain stimulating activities (puzzles, reading, adult coloring books, staying active) to keep memory sharp.   I have personally reviewed and noted the following in the patient's chart:   . Medical and social history . Use of alcohol, tobacco or illicit drugs  . Current medications and supplements . Functional ability and status . Nutritional status . Physical activity . Advanced directives . List of other physicians . Vitals . Screenings to include cognitive, depression, and falls . Referrals and appointments  In addition, I have reviewed and discussed with patient certain preventive protocols, quality metrics, and best practice recommendations. A written personalized care plan for preventive services as well as general preventive health recommendations were provided to patient.     Michiel Cowboy, RN  12/30/2018    Medical screening examination/treatment/procedure(s) were performed by non-physician practitioner and as supervising physician I was immediately available for consultation/collaboration. I agree with above. Cathlean Cower, MD

## 2018-12-30 ENCOUNTER — Ambulatory Visit (INDEPENDENT_AMBULATORY_CARE_PROVIDER_SITE_OTHER): Payer: Medicare Other | Admitting: *Deleted

## 2018-12-30 VITALS — BP 154/78 | HR 76 | Resp 18 | Ht 65.0 in | Wt 340.0 lb

## 2018-12-30 DIAGNOSIS — Z0000 Encounter for general adult medical examination without abnormal findings: Secondary | ICD-10-CM

## 2018-12-30 DIAGNOSIS — Z23 Encounter for immunization: Secondary | ICD-10-CM

## 2018-12-30 NOTE — Patient Instructions (Addendum)
[facility] in [place], [place] in: [place] Address: [address] [place], [place] [address] Phone: [phone]  Continue to eat heart healthy diet (full of fruits, vegetables, whole grains, lean protein, water--limit salt, fat, and sugar intake) and increase physical activity as tolerated.   Maria [name] , Thank you for taking time to come for your Medicare Wellness Visit. I appreciate your ongoing commitment to your health goals. Please review the following plan we discussed and let me know if I can assist you in the future.   These are the goals we discussed: Goals    . Patient Stated     Start to monitor diet to decrease carbohydrates, sugar and fat. Start to be more active by doing chair exercises and walking.     . Patient Stated     I want to continue to crochet and play magong. Do chair exercises and eat healthy.       This is a list of the screening recommended for you and due dates:  Health Maintenance  Topic Date Due  . Flu Shot  07/21/2018  . Mammogram  03/22/2019  . [place] Cancer Screening  07/10/2026  . Tetanus Vaccine  11/09/2027  .  Hepatitis C: One time screening is recommended by Center for Disease Control  (CDC) for  adults born from [birth year] through 1964.   Completed  . HIV Screening  Completed     It is important to avoid accidents which may result in broken bones.  Here are a few ideas on how to make your home safer so you will be less likely to trip or fall.  1. Use nonskid mats or non slip strips in your shower or tub, on your bathroom floor and around sinks.  If you know that you have spilled water, wipe it up! 2. In the bathroom, it is important to have properly installed grab bars on the walls or on the edge of the tub.  Towel racks are NOT strong enough for you to hold onto or to pull on for support. 3. Stairs and hallways should have enough light.  Add lamps or night lights  if you need ore light. 4. It is good to have handrails on both sides of the stairs if possible.  Always fix broken handrails right away. 5. It is important to see the edges of steps.  Paint the edges of outdoor steps white so you can see them better.  Put colored tape on the edge of inside steps. 6. Throw-rugs are dangerous because they can slide.  Removing the rugs is the best idea, but if they must stay, add adhesive carpet tape to prevent slipping. 7. Do not keep things on stairs or in the halls.  Remove small furniture that blocks the halls as it may cause you to trip.  Keep telephone and electrical cords out of the way where you walk. 8. Always were sturdy, rubber-soled shoes for good support.  Never wear just socks, especially on the stairs.  Socks may cause you to slip or fall.  Do not wear full-length housecoats as you can easily trip on the bottom.  9. Place the things you use the most on the shelves that are the easiest to reach.  If you use a stepstool, make sure it is in good condition.  If you feel unsteady, DO NOT climb, ask for help. If a health professional advises you to use a  cane or walker, do not be ashamed.  These items can keep you from falling and breaking your bones.  Health Maintenance, Female Adopting a healthy lifestyle and getting preventive care can go a long way to promote health and wellness. Talk with your health care provider about what schedule of regular examinations is right for you. This is a good chance for you to check in with your provider about disease prevention and staying healthy. In between checkups, there are plenty of things you can do on your own. Experts have done a lot of research about which lifestyle changes and preventive measures are most likely to keep you healthy. Ask your health care provider for more information. Weight and diet Eat a healthy diet  Be sure to include plenty of vegetables, fruits, low-fat dairy products, and lean protein.  Do  not eat a lot of foods high in solid fats, added sugars, or salt.  Get regular exercise. This is one of the most important things you can do for your health. ? Most adults should exercise for at least 150 minutes each week. The exercise should increase your heart rate and make you sweat (moderate-intensity exercise). ? Most adults should also do strengthening exercises at least twice a week. This is in addition to the moderate-intensity exercise. Maintain a healthy weight  Body mass index (BMI) is a measurement that can be used to identify possible weight problems. It estimates body fat based on height and weight. Your health care provider can help determine your BMI and help you achieve or maintain a healthy weight.  For females 57 years of age and older: ? A BMI below 18.5 is considered underweight. ? A BMI of 18.5 to 24.9 is normal. ? A BMI of 25 to 29.9 is considered overweight. ? A BMI of 30 and above is considered obese. Watch levels of cholesterol and blood lipids  You should start having your blood tested for lipids and cholesterol at 62 years of age, then have this test every 5 years.  You may need to have your cholesterol levels checked more often if: ? Your lipid or cholesterol levels are high. ? You are older than 62 years of age. ? You are at high risk for heart disease. Cancer screening Lung Cancer  Lung cancer screening is recommended for adults 78-75 years old who are at high risk for lung cancer because of a history of smoking.  A yearly low-dose CT scan of the lungs is recommended for people who: ? Currently smoke. ? Have quit within the past 15 years. ? Have at least a 30-pack-year history of smoking. A pack year is smoking an average of one pack of cigarettes a day for 1 year.  Yearly screening should continue until it has been 15 years since you quit.  Yearly screening should stop if you develop a health problem that would prevent you from having lung cancer  treatment. Breast Cancer  Practice breast self-awareness. This means understanding how your breasts normally appear and feel.  It also means doing regular breast self-exams. Let your health care provider know about any changes, no matter how small.  If you are in your 20s or 30s, you should have a clinical breast exam (CBE) by a health care provider every 1-3 years as part of a regular health exam.  If you are 22 or older, have a CBE every year. Also consider having a breast X-ray (mammogram) every year.  If you have a family history of breast  cancer, talk to your health care provider about genetic screening.  If you are at high risk for breast cancer, talk to your health care provider about having an MRI and a mammogram every year.  Breast cancer gene (BRCA) assessment is recommended for women who have family members with BRCA-related cancers. BRCA-related cancers include: ? Breast. ? Ovarian. ? Tubal. ? Peritoneal cancers.  Results of the assessment will determine the need for genetic counseling and BRCA1 and BRCA2 testing. Cervical Cancer Your health care provider may recommend that you be screened regularly for cancer of the pelvic organs (ovaries, uterus, and vagina). This screening involves a pelvic examination, including checking for microscopic changes to the surface of your cervix (Pap test). You may be encouraged to have this screening done every 3 years, beginning at age 54.  For women ages 49-65, health care providers may recommend pelvic exams and Pap testing every 3 years, or they may recommend the Pap and pelvic exam, combined with testing for human papilloma virus (HPV), every 5 years. Some types of HPV increase your risk of cervical cancer. Testing for HPV may also be done on women of any age with unclear Pap test results.  Other health care providers may not recommend any screening for nonpregnant women who are considered low risk for pelvic cancer and who do not have  symptoms. Ask your health care provider if a screening pelvic exam is right for you.  If you have had past treatment for cervical cancer or a condition that could lead to cancer, you need Pap tests and screening for cancer for at least 20 years after your treatment. If Pap tests have been discontinued, your risk factors (such as having a new sexual partner) need to be reassessed to determine if screening should resume. Some women have medical problems that increase the chance of getting cervical cancer. In these cases, your health care provider may recommend more frequent screening and Pap tests. Colorectal Cancer  This type of cancer can be detected and often prevented.  Routine colorectal cancer screening usually begins at 62 years of age and continues through 62 years of age.  Your health care provider may recommend screening at an earlier age if you have risk factors for [place] cancer.  Your health care provider may also recommend using home test kits to check for hidden blood in the stool.  A small camera at the end of a tube can be used to examine your [place] directly (sigmoidoscopy or colonoscopy). This is done to check for the earliest forms of colorectal cancer.  Routine screening usually begins at age 2.  Direct examination of the [place] should be repeated every 5-10 years through 62 years of age. However, you may need to be screened more often if early forms of precancerous polyps or small growths are found. Skin Cancer  Check your skin from head to toe regularly.  Tell your health care provider about any new moles or changes in moles, especially if there is a change in a mole's shape or color.  Also tell your health care provider if you have a mole that is larger than the size of a pencil eraser.  Always use sunscreen. Apply sunscreen liberally and repeatedly throughout the day.  Protect yourself by wearing long sleeves, pants, a wide-brimmed hat, and sunglasses whenever you are  outside. Heart disease, diabetes, and high blood pressure  High blood pressure causes heart disease and increases the risk of stroke. High blood pressure is more likely to  develop in: ? People who have blood pressure in the high end of the normal range (130-139/85-89 mm Hg). ? People who are overweight or obese. ? People who are African American.  If you are 41-59 years of age, have your blood pressure checked every 3-5 years. If you are 36 years of age or older, have your blood pressure checked every year. You should have your blood pressure measured twice-once when you are at a hospital or clinic, and once when you are not at a hospital or clinic. Record the average of the two measurements. To check your blood pressure when you are not at a hospital or clinic, you can use: ? An automated blood pressure machine at a pharmacy. ? A home blood pressure monitor.  If you are between 78 years and 91 years old, ask your health care provider if you should take aspirin to prevent strokes.  Have regular diabetes screenings. This involves taking a blood sample to check your fasting blood sugar level. ? If you are at a normal weight and have a low risk for diabetes, have this test once every three years after 62 years of age. ? If you are overweight and have a high risk for diabetes, consider being tested at a younger age or more often. Preventing infection Hepatitis B  If you have a higher risk for hepatitis B, you should be screened for this virus. You are considered at high risk for hepatitis B if: ? You were born in a country where hepatitis B is common. Ask your health care provider which countries are considered high risk. ? Your parents were born in a high-risk country, and you have not been immunized against hepatitis B (hepatitis B vaccine). ? You have HIV or AIDS. ? You use needles to inject street drugs. ? You live with someone who has hepatitis B. ? You have had sex with someone who has  hepatitis B. ? You get hemodialysis treatment. ? You take certain medicines for conditions, including cancer, organ transplantation, and autoimmune conditions. Hepatitis C  Blood testing is recommended for: ? Everyone born from [birth year] through 1964. ? Anyone with known risk factors for hepatitis C. Sexually transmitted infections (STIs)  You should be screened for sexually transmitted infections (STIs) including gonorrhea and chlamydia if: ? You are sexually active and are younger than 62 years of age. ? You are older than 62 years of age and your health care provider tells you that you are at risk for this type of infection. ? Your sexual activity has changed since you were last screened and you are at an increased risk for chlamydia or gonorrhea. Ask your health care provider if you are at risk.  If you do not have HIV, but are at risk, it may be recommended that you take a prescription medicine daily to prevent HIV infection. This is called pre-exposure prophylaxis (PrEP). You are considered at risk if: ? You are sexually active and do not regularly use condoms or know the HIV status of your partner(s). ? You take drugs by injection. ? You are sexually active with a partner who has HIV. Talk with your health care provider about whether you are at high risk of being infected with HIV. If you choose to begin PrEP, you should first be tested for HIV. You should then be tested every 3 months for as long as you are taking PrEP. Pregnancy  If you are premenopausal and you may become pregnant, ask your health care  provider about preconception counseling.  If you may become pregnant, take 400 to 800 micrograms (mcg) of folic acid every day.  If you want to prevent pregnancy, talk to your health care provider about birth control (contraception). Osteoporosis and menopause  Osteoporosis is a disease in which the bones lose minerals and strength with aging. This can result in serious bone  fractures. Your risk for osteoporosis can be identified using a bone density scan.  If you are 67 years of age or older, or if you are at risk for osteoporosis and fractures, ask your health care provider if you should be screened.  Ask your health care provider whether you should take a calcium or vitamin D supplement to lower your risk for osteoporosis.  Menopause may have certain physical symptoms and risks.  Hormone replacement therapy may reduce some of these symptoms and risks. Talk to your health care provider about whether hormone replacement therapy is right for you. Follow these instructions at home:  Schedule regular health, dental, and eye exams.  Stay current with your immunizations.  Do not use any tobacco products including cigarettes, chewing tobacco, or electronic cigarettes.  If you are pregnant, do not drink alcohol.  If you are breastfeeding, limit how much and how often you drink alcohol.  Limit alcohol intake to no more than 1 drink per day for nonpregnant women. One drink equals 12 ounces of beer, 5 ounces of wine, or 1 ounces of hard liquor.  Do not use street drugs.  Do not share needles.  Ask your health care provider for help if you need support or information about quitting drugs.  Tell your health care provider if you often feel depressed.  Tell your health care provider if you have ever been abused or do not feel safe at home. This information is not intended to replace advice given to you by your health care provider. Make sure you discuss any questions you have with your health care provider. Document Released: 06/22/2011 Document Revised: 05/14/2016 Document Reviewed: 09/10/2015 Elsevier Interactive Patient Education  2019 [employer].  Influenza Virus Vaccine (Flucelvax) What is this medicine? INFLUENZA VIRUS VACCINE (in floo EN zuh VAHY ruhs vak SEEN) helps to reduce the risk of getting influenza also known as the flu. The vaccine only  helps protect you against some strains of the flu. This medicine may be used for other purposes; ask your health care provider or pharmacist if you have questions. COMMON BRAND NAME(S): FLUCELVAX What should I tell my health care provider before I take this medicine? They need to know if you have any of these conditions: -bleeding disorder like hemophilia -fever or infection -Guillain-Barre syndrome or other neurological problems -immune system problems -infection with the human immunodeficiency virus (HIV) or AIDS -low blood platelet counts -multiple sclerosis -an unusual or allergic reaction to influenza virus vaccine, other medicines, foods, dyes or preservatives -pregnant or trying to get pregnant -breast-feeding How should I use this medicine? This vaccine is for injection into a muscle. It is given by a health care professional. A copy of Vaccine Information Statements will be given before each vaccination. Read this sheet carefully each time. The sheet may change frequently. Talk to your pediatrician regarding the use of this medicine in children. Special care may be needed. Overdosage: If you think you've taken too much of this medicine contact a poison control center or emergency room at once. Overdosage: If you think you have taken too much of this medicine contact a poison  control center or emergency room at once. NOTE: This medicine is only for you. Do not share this medicine with others. What if I miss a dose? This does not apply. What may interact with this medicine? -chemotherapy or radiation therapy -medicines that lower your immune system like etanercept, anakinra, infliximab, and adalimumab -medicines that treat or prevent blood clots like warfarin -phenytoin -steroid medicines like prednisone or cortisone -theophylline -vaccines This list may not describe all possible interactions. Give your health care provider a list of all the medicines, herbs, non-prescription  drugs, or dietary supplements you use. Also tell them if you smoke, drink alcohol, or use illegal drugs. Some items may interact with your medicine. What should I watch for while using this medicine? Report any side effects that do not go away within 3 days to your doctor or health care professional. Call your health care provider if any unusual symptoms occur within 6 weeks of receiving this vaccine. You may still catch the flu, but the illness is not usually as bad. You cannot get the flu from the vaccine. The vaccine will not protect against colds or other illnesses that may cause fever. The vaccine is needed every year. What side effects may I notice from receiving this medicine? Side effects that you should report to your doctor or health care professional as soon as possible: -allergic reactions like skin rash, itching or hives, swelling of the face, lips, or tongue Side effects that usually do not require medical attention (Report these to your doctor or health care professional if they continue or are bothersome.): -fever -headache -muscle aches and pains -pain, tenderness, redness, or swelling at the injection site -tiredness This list may not describe all possible side effects. Call your doctor for medical advice about side effects. You may report side effects to FDA at 1-800-FDA-1088. Where should I keep my medicine? The vaccine will be given by a health care professional in a clinic, pharmacy, doctor's office, or other health care setting. You will not be given vaccine doses to store at home. NOTE: This sheet is a summary. It may not cover all possible information. If you have questions about this medicine, talk to your doctor, pharmacist, or health care provider.  2019 Elsevier/Gold Standard (2011-11-18 14:06:47)

## 2018-12-31 ENCOUNTER — Encounter (HOSPITAL_COMMUNITY): Payer: Self-pay

## 2018-12-31 ENCOUNTER — Emergency Department (HOSPITAL_COMMUNITY): Payer: Medicare Other

## 2018-12-31 ENCOUNTER — Emergency Department (HOSPITAL_COMMUNITY)
Admission: EM | Admit: 2018-12-31 | Discharge: 2018-12-31 | Disposition: A | Discharge status: Home / Self Care | Payer: Medicare Other | Attending: Emergency Medicine | Admitting: Emergency Medicine

## 2018-12-31 DIAGNOSIS — Z91040 Latex allergy status: Secondary | ICD-10-CM | POA: Insufficient documentation

## 2018-12-31 DIAGNOSIS — I10 Essential (primary) hypertension: Secondary | ICD-10-CM | POA: Insufficient documentation

## 2018-12-31 DIAGNOSIS — R0602 Shortness of breath: Secondary | ICD-10-CM | POA: Diagnosis not present

## 2018-12-31 DIAGNOSIS — R0682 Tachypnea, not elsewhere classified: Secondary | ICD-10-CM | POA: Diagnosis not present

## 2018-12-31 DIAGNOSIS — Z87891 Personal history of nicotine dependence: Secondary | ICD-10-CM | POA: Insufficient documentation

## 2018-12-31 DIAGNOSIS — R079 Chest pain, unspecified: Secondary | ICD-10-CM

## 2018-12-31 DIAGNOSIS — R1012 Left upper quadrant pain: Secondary | ICD-10-CM | POA: Insufficient documentation

## 2018-12-31 DIAGNOSIS — R7989 Other specified abnormal findings of blood chemistry: Secondary | ICD-10-CM | POA: Diagnosis not present

## 2018-12-31 DIAGNOSIS — Z79899 Other long term (current) drug therapy: Secondary | ICD-10-CM | POA: Insufficient documentation

## 2018-12-31 DIAGNOSIS — I959 Hypotension, unspecified: Secondary | ICD-10-CM | POA: Diagnosis not present

## 2018-12-31 DIAGNOSIS — R0789 Other chest pain: Secondary | ICD-10-CM | POA: Diagnosis not present

## 2018-12-31 LAB — BASIC METABOLIC PANEL
Anion gap: 11 (ref 5–15)
BUN: 11 mg/dL (ref 8–23)
CO2: 25 mmol/L (ref 22–32)
Calcium: 8.6 mg/dL — ABNORMAL LOW (ref 8.9–10.3)
Chloride: 102 mmol/L (ref 98–111)
Creatinine, Ser: 0.86 mg/dL (ref 0.44–1.00)
GFR calc Af Amer: >60 mL/min (ref >60)
Glucose, Bld: 79 mg/dL (ref 70–99)
Potassium: 4 mmol/L (ref 3.5–5.1)
Sodium: 138 mmol/L (ref 135–145)

## 2018-12-31 LAB — CBC WITH DIFFERENTIAL/PLATELET
Abs Immature Granulocytes: 0.02 10*3/uL (ref 0.00–0.07)
BASOS PCT: 1 %
Basophils Absolute: 0.1 10*3/uL (ref 0.0–0.1)
Eosinophils Absolute: 0.1 10*3/uL (ref 0.0–0.5)
Eosinophils Relative: 1 %
HCT: 41.6 % (ref 36.0–46.0)
Hemoglobin: 13 g/dL (ref 12.0–15.0)
Immature Granulocytes: 0 %
Lymphocytes Relative: 30 %
Lymphs Abs: 2.8 10*3/uL (ref 0.7–4.0)
MCH: 28.3 pg (ref 26.0–34.0)
MCHC: 31.3 g/dL (ref 30.0–36.0)
MCV: 90.4 fL (ref 80.0–100.0)
MONO ABS: 0.7 10*3/uL (ref 0.1–1.0)
Monocytes Relative: 7 %
Neutro Abs: 5.7 10*3/uL (ref 1.7–7.7)
Neutrophils Relative %: 61 %
PLATELETS: 241 10*3/uL (ref 150–400)
RBC: 4.6 MIL/uL (ref 3.87–5.11)
RDW: 13 % (ref 11.5–15.5)
WBC: 9.3 10*3/uL (ref 4.0–10.5)
nRBC: 0 % (ref 0.0–0.2)

## 2018-12-31 LAB — D-DIMER, QUANTITATIVE: D-Dimer, Quant: 2.12 ug/mL-FEU — ABNORMAL HIGH (ref 0.00–0.50)

## 2018-12-31 LAB — TROPONIN I: Troponin I: <0.03 ng/mL (ref <0.03)

## 2018-12-31 MED ORDER — SODIUM CHLORIDE 0.9% FLUSH: 10.0000 mL | Freq: Two times a day (BID) | INTRAVENOUS | Status: DC

## 2018-12-31 MED ORDER — SODIUM CHLORIDE 0.9% FLUSH: 10.0000 mL | INTRAVENOUS | Status: DC | PRN

## 2018-12-31 MED ORDER — IOPAMIDOL (ISOVUE-370) INJECTION 76%
100.0000 mL | Freq: Once | INTRAVENOUS | Status: AC | PRN
  Administered 2018-12-31: 100 mL via INTRAVENOUS

## 2018-12-31 MED ORDER — IOPAMIDOL (ISOVUE-370) INJECTION 76%
INTRAVENOUS | Status: AC
  Filled 2018-12-31: qty 100

## 2018-12-31 MED ORDER — KETOROLAC TROMETHAMINE 30 MG/ML IJ SOLN
30.0000 mg | Freq: Once | INTRAMUSCULAR | Status: AC
Start: 2018-12-31 — End: 2018-12-31
  Administered 2018-12-31: 30 mg via INTRAVENOUS
  Filled 2018-12-31: qty 1

## 2018-12-31 NOTE — ED Triage Notes (Signed)
Arrives from [facility], reports int chest pain past week for [facility], reports "spasms" in chest. Hx anxiety, states it does not feel like an anxiety attack. Went to wellness clinic yesterday and seen by a nurse

## 2018-12-31 NOTE — ED Provider Notes (Signed)
[facility] EMERGENCY DEPARTMENT Provider Note   CSN: [id number] Arrival date & time: 12/31/18  1651     History   Chief Complaint No chief complaint on file.   HPI Maria [name] is a 63 y.o. female.  Patient complains of pain in her left lower chest/left upper abdomen for approximately 1 week intermittently, more steady the past 24 hours.  Pain radiates to her back.  No crushing substernal chest pain, dyspnea, diaphoresis, nausea, dysuria, hematuria.  Pain is worse when she sits up.  No history of MI.  No prolonged travel or immobilization.     Past Medical History:  Diagnosis Date  . Acute lymphadenitis 2011  . ALLERGIC RHINITIS 08/10/2007  . ANXIETY 12/06/2007  . Atherosclerotic peripheral vascular disease ([place]) 06/13/2013   Aorta on CT June 2014  . BACK PAIN 12/06/2007  . Cellulitis 2011  . Cervical disc disease 03/09/2012  . Chronic pain 03/09/2012  . DEPRESSION 12/06/2007  . DIVERTICULOSIS, [place] 12/06/2007  . GERD 12/06/2007  . Hepatitis    age 42 hepatitis A  . HYPERTENSION 12/06/2007  . Impaired glucose tolerance 03/13/2012  . LOW BACK PAIN 12/06/2007  . Lumbar disc disease 03/09/2012  . Mesenteric adenitis   . MRSA 2006  . Pain    by breast area  . Pain in joint, multiple sites 04/21/2010  . PARONYCHIA, FINGER 08/13/2009  . Pneumonia sept 2015   walking pneumonia, sepsis   . RASH-NONVESICULAR 05/09/2008  . 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 SHOULDER PAIN, LEFT 05/09/2008  . SINUSITIS- ACUTE-NOS 05/09/2008  . THORACIC/LUMBOSACRAL NEURITIS/RADICULITIS UNSPEC 12/28/2008    Patient Active Problem List   Diagnosis Date Noted  . Jerking movements of extremities 07/20/2018  . Balance disorder 07/20/2018  . Allergic rhinitis 06/15/2018  . Right knee pain 03/21/2018  . OSA (obstructive sleep apnea) 03/21/2018  . Oxygen desaturation 03/21/2018  . Urinary symptom or sign 03/21/2018  . Acquired lymphedema 01/25/2018  . Gait disorder  01/25/2018  . Peripheral edema 01/16/2018  . SOB (shortness of breath) 01/15/2018  . 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 Preventative health care 11/08/2017  . Abdominal pain, epigastric   . Dysphagia   . [place] cancer screening   . Adverse effect of angiotensin-converting enzyme inhibitor 12/05/2015  . Restless leg 10/08/2015  . Dermatitis, stasis 04/26/2015  . Fibromyalgia 01/31/2015  . Acid reflux 10/17/2014  . 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 Post-traumatic osteoarthritis of both knees 09/19/2014  . Spinal stenosis 09/19/2014  . Cervical disc disease 03/09/2012  . Lumbar disc disease 03/09/2012  . Chronic pain 03/09/2012  . Obesity 06/17/2011  . Chronic low back pain 12/28/2008  . Anxiety state 12/06/2007  . Depression 12/06/2007  . Essential hypertension 12/06/2007  . LOW BACK PAIN 12/06/2007    Past Surgical History:  Procedure Laterality Date  . ABDOMINAL HYSTERECTOMY  1999   1 ovary left  . bloo clot removed from neck   may 25th , june 2. 2010  . [place] SURGERY  may 24th 2010  . COLONOSCOPY WITH PROPOFOL N/A 07/10/2016   Procedure: COLONOSCOPY WITH PROPOFOL;  Surgeon: Manus Gunning, MD;  Location: [facility];  Service: Gastroenterology;  Laterality: N/A;  . ESOPHAGOGASTRODUODENOSCOPY (EGD) WITH PROPOFOL N/A 07/10/2016   Procedure: ESOPHAGOGASTRODUODENOSCOPY (EGD) WITH PROPOFOL;  Surgeon: Manus Gunning, MD;  Location: [facility];  Service: Gastroenterology;  Laterality: N/A;  . s/p ovary cyst    . s/p right knee arthroscopy     Dr.  [facility]     OB History   No obstetric history on file.      Home Medications    Prior to Admission medications   Medication Sig Start Date End Date Taking? Authorizing Provider  acetaminophen (TYLENOL) 325 MG tablet Take 650 mg by mouth every 6 (six) hours as needed for headache (pain).   Yes [provider]  clonazePAM (KLONOPIN) 1 MG tablet TAKE 1 TABLET BY MOUTH 2  TIMES DAILY AS NEEDED FOR ANXIETY Patient taking differently: Take 1 mg by mouth 2 (two) times daily as needed for anxiety.  10/25/18  Yes Biagio Borg, MD  cyclobenzaprine (FLEXERIL) 10 MG tablet TAKE 1 TABLET BY MOUTH 2 TIMES A DAY AS NEEDED FOR MUSCLE SPASMS Patient taking differently: Take 10 mg by mouth daily.  12/28/18  Yes Biagio Borg, MD  dicyclomine (BENTYL) 10 MG capsule Take 1 capsule (10 mg total) 4 (four) times daily -  before meals and at bedtime by mouth. Patient taking differently: Take 10 mg by mouth 4 (four) times daily as needed for spasms.  11/08/17  Yes Biagio Borg, MD  furosemide (LASIX) 40 MG tablet TAKE 1 TABLET BY MOUTH DAILY Patient taking differently: Take 40 mg by mouth daily as needed (hands and ankle swelling).  12/28/18  Yes Biagio Borg, MD  losartan (COZAAR) 50 MG tablet TAKE 1 TABLET BY MOUTH EVERY DAY Patient taking differently: Take 50 mg by mouth daily.  12/28/18  Yes Biagio Borg, MD  ondansetron (ZOFRAN) 4 MG tablet TAKE 1 TABLET BY MOUTH EVERY 8 HOURS AS NEEDED FOR NAUSEA OR VOMITING Patient taking differently: Take 4 mg by mouth every 8 (eight) hours as needed for nausea or vomiting.  04/28/18  Yes Biagio Borg, MD  Oxycodone HCl 10 MG TABS Take 1 tablet (10 mg total) 4 (four) times daily by mouth. Per [facility] Patient taking differently: Take 10 mg by mouth 5 (five) times daily. Per [facility] 11/08/17  Yes Biagio Borg, MD  pantoprazole (PROTONIX) 40 MG tablet TAKE 1 TABLET BY MOUTH EVERY DAY Patient taking differently: Take 40 mg by mouth daily.  11/25/18  Yes Biagio Borg, MD  potassium chloride (K-DUR) 10 MEQ tablet TAKE 1 TABLET BY MOUTH DAILY Patient taking differently: Take 10 mEq by mouth every other day.  12/28/18  Yes Biagio Borg, MD  sertraline (ZOLOFT) 100 MG tablet Take 1.5 tablets (150 mg total) by mouth daily. Patient taking differently: Take 100 mg by mouth daily.  07/20/18  Yes Biagio Borg, MD  Tetrahydrozoline HCl  (VISINE OP) Place 1 drop into both eyes daily.   Yes [provider]  triamcinolone (NASACORT) 55 MCG/ACT AERO nasal inhaler Place 2 sprays into the nose daily. Patient taking differently: Place 2 sprays into the nose daily as needed (sneezing/runny nose).  06/13/18  Yes Biagio Borg, MD  triamcinolone cream (KENALOG) 0.1 % Apply 1 application 2 (two) times daily as needed topically (rash). Patient taking differently: Apply 1 application topically every evening. For dry skin on legs 11/08/17  Yes Biagio Borg, MD  DULoxetine (CYMBALTA) 60 MG capsule Take 60 mg by mouth daily.    03/09/12  [provider]  fexofenadine (ALLEGRA) 180 MG tablet Take 180 mg by mouth daily.    03/09/12  [provider]    Family History Family History  Problem Relation Age of Onset  . Heart disease Mother   .  Hypertension Mother   . Diabetes Mother   . Heart failure Mother   . Asthma Sister   . Hypertension Father   . Asthma Daughter   . Cancer Maternal Uncle        Behney  . Cancer Other        ovarian  . Liver cancer Paternal Grandmother        ????    Social History Social History   Tobacco Use  . Smoking status: Former Smoker    Years: 30.00    Last attempt to quit: 11/08/2008    Years since quitting: 10.1  . Smokeless tobacco: Never Used  . Tobacco comment: quit 10/09  Substance Use Topics  . Alcohol use: No    Comment: quit drinking 10/07/1997  . Drug use: No     Allergies   Amoxicillin-pot clavulanate; Adhesive [tape]; Codeine; Lactose intolerance (gi); Morphine and related; Vicodin [hydrocodone-acetaminophen]; and Latex   Review of Systems Review of Systems  All other systems reviewed and are negative.    Physical Exam Updated Vital Signs BP (!) 126/99   Pulse 82   Temp 97.9 F (36.6 C) (Oral)   Resp (!) 22   Ht 5\' 3"  (1.6 m)   Wt (!) 149.7 kg   SpO2 99%   BMI 58.46 kg/m   Physical Exam Vitals signs and nursing note reviewed.   Constitutional:      Appearance: She is well-developed.     Comments: Elevated BMI  HENT:     Head: Normocephalic and atraumatic.  Eyes:     Conjunctiva/sclera: Conjunctivae normal.  Neck:     Musculoskeletal: Neck supple.  Cardiovascular:     Rate and Rhythm: Normal rate and regular rhythm.  Pulmonary:     Effort: Pulmonary effort is normal.     Breath sounds: Normal breath sounds.  Abdominal:     General: Bowel sounds are normal.     Palpations: Abdomen is soft.  Musculoskeletal: Normal range of motion.  Skin:    General: Skin is warm and dry.  Neurological:     Mental Status: She is alert and oriented to person, place, and time.  Psychiatric:        Behavior: Behavior normal.      ED Treatments / Results  Labs (all labs ordered are listed, but only abnormal results are displayed) Labs Reviewed  BASIC METABOLIC PANEL - Abnormal; Notable for the following components:      Result Value   Calcium 8.6 (*)    All other components within normal limits  D-DIMER, QUANTITATIVE (NOT AT [facility]) - Abnormal; Notable for the following components:   D-Dimer, Quant 2.12 (*)    All other components within normal limits  CBC WITH DIFFERENTIAL/PLATELET  TROPONIN I    EKG EKG Interpretation  Date/Time:  Saturday December 31 2018 16:59:33 EST Ventricular Rate:  80 PR Interval:    QRS Duration: 101 QT Interval:  393 QTC Calculation: 454 R Axis:   64 Text Interpretation:  Sinus rhythm Minimal ST depression, inferior leads Confirmed by Nat Christen [phone]) on 12/31/2018 5:24:29 PM Also confirmed by Nat Christen [phone])  on 12/31/2018 5:24:51 PM   Radiology Dg Chest 2 View  Result Date: 12/31/2018 CLINICAL DATA:  63 y/o female with c/o left CP x1 week. Radiated to back today. Also c/o sob, nausea, headacheHx HTN, former smoker. EXAM: CHEST - 2 VIEW COMPARISON:  05/13/2018 FINDINGS: Cardiac silhouette top-normal in size. No mediastinal or hilar masses. No evidence of  adenopathy. There  are mildly prominent bronchovascular markings bilaterally similar to the prior study. No evidence of pneumonia or pulmonary edema. No pleural effusion or pneumothorax. Skeletal structures are intact. IMPRESSION: No active cardiopulmonary disease. Electronically Signed   By: Lajean Manes M.D.   On: 12/31/2018 17:54   Ct Angio Chest Pe W And/or Wo Contrast  Result Date: 12/31/2018 CLINICAL DATA:  One-week history of chest pain and anxiety. Elevated D-dimer. EXAM: CT ANGIOGRAPHY CHEST WITH CONTRAST TECHNIQUE: Multidetector CT imaging of the chest was performed using the standard protocol during bolus administration of intravenous contrast. Multiplanar CT image reconstructions and MIPs were obtained to evaluate the vascular anatomy. CONTRAST:  125mL ISOVUE-370 IOPAMIDOL INJECTION 76% IV. COMPARISON:  03/22/2018. FINDINGS: Cardiovascular: Contrast opacification of the pulmonary arteries is fair. Respiratory motion blurred. Several of the images. Overall, the study is of fair diagnostic quality. No filling defects within either main pulmonary artery or their segmental branches in either lung to suggest pulmonary embolism. Heart mildly enlarged. Mild LAD coronary atherosclerosis. No pericardial effusion. Mild atherosclerosis involving the thoracic and proximal abdominal aorta without evidence of aneurysm. 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 Mediastinum/Nodes: No pathologically enlarged mediastinal, hilar or axillary lymph nodes. No mediastinal masses. Normal-appearing esophagus. Approximate 1.4 cm nodule involving the lower pole of the LEFT lobe of the thyroid gland, unchanged since the prior CT. Lungs/Pleura: Linear atelectasis involving the LEFT LOWER LOBE. Scattered areas of hyperlucency throughout both lungs indicating focal air trapping. No confluent airspace consolidation. No pleural effusions. No parenchymal nodules or masses. Central  airways patent with mild bronchial wall thickening. Upper Abdomen: Visualized upper abdomen unremarkable for the early arterial phase of enhancement. Musculoskeletal: Degenerative disc disease and spondylosis throughout the thoracic spine. No acute findings. Review of the MIP images confirms the above findings. IMPRESSION: 1. No evidence of pulmonary embolism. 2. Scattered areas of focal air trapping in both lungs and mild central bronchial wall thickening indicates mild changes of asthma and/or bronchitis. No acute cardiopulmonary disease otherwise. Aortic Atherosclerosis (ICD10-I70.0). Electronically Signed   By: Evangeline Dakin M.D.   On: 12/31/2018 21:32    Procedures Procedures (including critical care time)  Medications Ordered in ED Medications  iopamidol (ISOVUE-370) 76 % injection (has no administration in time range)  sodium chloride flush (NS) 0.9 % injection 10-40 mL (has no administration in time range)  sodium chloride flush (NS) 0.9 % injection 10-40 mL (has no administration in time range)  ketorolac (TORADOL) 30 MG/ML injection 30 mg (30 mg Intravenous Given 12/31/18 1729)  iopamidol (ISOVUE-370) 76 % injection 100 mL (100 mLs Intravenous Contrast Given 12/31/18 2102)     Initial Impression / Assessment and Plan / ED Course  I have reviewed the triage vital signs and the nursing notes.  Pertinent labs & imaging results that were available during my care of the patient were reviewed by me and considered in my medical decision making (see chart for details).     Patient presents with left lower chest/left upper abdominal pain for 1 week.  Work-up including EKG, chest x-ray, CT angiogram of chest, troponin all negative.  Patient is in no acute distress at discharge.  Final Clinical Impressions(s) / ED Diagnoses   Final diagnoses:  Chest pain, unspecified type    ED Discharge Orders    None       Nat Christen, MD 12/31/18 2352

## 2018-12-31 NOTE — Discharge Instructions (Addendum)
Tests were good.  No evidence of a heart attack or blood clot in your lung.  Follow-up with your primary care doctor.

## 2018-12-31 NOTE — ED Notes (Signed)
Patient verbalizes understanding of medications and discharge instructions. No further questions at this time. VSS and patient ambulatory at discharge.   

## 2019-01-04 ENCOUNTER — Ambulatory Visit: Payer: Medicare Other | Admitting: Internal Medicine

## 2019-01-20 DIAGNOSIS — M25561 Pain in right knee: Secondary | ICD-10-CM | POA: Diagnosis not present

## 2019-01-20 DIAGNOSIS — G8929 Other chronic pain: Secondary | ICD-10-CM | POA: Diagnosis not present

## 2019-01-20 DIAGNOSIS — M25512 Pain in left shoulder: Secondary | ICD-10-CM | POA: Diagnosis not present

## 2019-01-20 DIAGNOSIS — M542 Cervicalgia: Secondary | ICD-10-CM | POA: Diagnosis not present

## 2019-01-20 DIAGNOSIS — Z79891 Long term (current) use of opiate analgesic: Secondary | ICD-10-CM | POA: Diagnosis not present

## 2019-01-20 DIAGNOSIS — M25552 Pain in left hip: Secondary | ICD-10-CM | POA: Diagnosis not present

## 2019-01-20 DIAGNOSIS — G894 Chronic pain syndrome: Secondary | ICD-10-CM | POA: Diagnosis not present

## 2019-01-25 ENCOUNTER — Other Ambulatory Visit (INDEPENDENT_AMBULATORY_CARE_PROVIDER_SITE_OTHER): Payer: Medicare Other

## 2019-01-25 ENCOUNTER — Encounter: Payer: Self-pay | Admitting: Internal Medicine

## 2019-01-25 ENCOUNTER — Ambulatory Visit (INDEPENDENT_AMBULATORY_CARE_PROVIDER_SITE_OTHER): Payer: Medicare Other | Admitting: Internal Medicine

## 2019-01-25 VITALS — BP 126/86 | HR 98 | Temp 98.2°F | Ht 63.0 in | Wt 344.0 lb

## 2019-01-25 DIAGNOSIS — Z0000 Encounter for general adult medical examination without abnormal findings: Secondary | ICD-10-CM | POA: Diagnosis not present

## 2019-01-25 DIAGNOSIS — R739 Hyperglycemia, unspecified: Secondary | ICD-10-CM

## 2019-01-25 DIAGNOSIS — E041 Nontoxic single thyroid nodule: Secondary | ICD-10-CM | POA: Insufficient documentation

## 2019-01-25 LAB — TSH: TSH: 2.58 u[IU]/mL (ref 0.35–4.50)

## 2019-01-25 LAB — URINALYSIS, ROUTINE W REFLEX MICROSCOPIC
Bilirubin Urine: NEGATIVE
Hgb urine dipstick: NEGATIVE
KETONES UR: NEGATIVE
Nitrite: NEGATIVE
RBC / HPF: NONE SEEN (ref 0–?)
Specific Gravity, Urine: <=1.005 — AB (ref 1.000–1.030)
Total Protein, Urine: NEGATIVE
Urine Glucose: NEGATIVE
Urobilinogen, UA: 0.2 (ref 0.0–1.0)
pH: 6.5 (ref 5.0–8.0)

## 2019-01-25 LAB — LIPID PANEL
Cholesterol: 199 mg/dL (ref 0–200)
HDL: 31.7 mg/dL — ABNORMAL LOW (ref >39.00)
NonHDL: 167.27
Total CHOL/HDL Ratio: 6
Triglycerides: 278 mg/dL — ABNORMAL HIGH (ref 0.0–149.0)
VLDL: 55.6 mg/dL — ABNORMAL HIGH (ref 0.0–40.0)

## 2019-01-25 LAB — CBC WITH DIFFERENTIAL/PLATELET
Basophils Absolute: 0.1 10*3/uL (ref 0.0–0.1)
Basophils Relative: 0.9 % (ref 0.0–3.0)
Eosinophils Absolute: 0.1 10*3/uL (ref 0.0–0.7)
Eosinophils Relative: 1.7 % (ref 0.0–5.0)
HEMATOCRIT: 39.7 % (ref 36.0–46.0)
Hemoglobin: 13.3 g/dL (ref 12.0–15.0)
Lymphocytes Relative: 36.6 % (ref 12.0–46.0)
Lymphs Abs: 2.8 10*3/uL (ref 0.7–4.0)
MCHC: 33.6 g/dL (ref 30.0–36.0)
MCV: 87.4 fl (ref 78.0–100.0)
Monocytes Absolute: 0.6 10*3/uL (ref 0.1–1.0)
Monocytes Relative: 7.9 % (ref 3.0–12.0)
Neutro Abs: 4.1 10*3/uL (ref 1.4–7.7)
Neutrophils Relative %: 52.9 % (ref 43.0–77.0)
PLATELETS: 206 10*3/uL (ref 150.0–400.0)
RBC: 4.54 Mil/uL (ref 3.87–5.11)
RDW: 13.8 % (ref 11.5–15.5)
WBC: 7.8 10*3/uL (ref 4.0–10.5)

## 2019-01-25 LAB — BASIC METABOLIC PANEL
BUN: 12 mg/dL (ref 6–23)
CHLORIDE: 102 meq/L (ref 96–112)
CO2: 27 mEq/L (ref 19–32)
Calcium: 9.2 mg/dL (ref 8.4–10.5)
Creatinine, Ser: 0.92 mg/dL (ref 0.40–1.20)
GFR: 61.91 mL/min (ref >60.00)
Glucose, Bld: 106 mg/dL — ABNORMAL HIGH (ref 70–99)
Potassium: 4 mEq/L (ref 3.5–5.1)
Sodium: 138 mEq/L (ref 135–145)

## 2019-01-25 LAB — HEPATIC FUNCTION PANEL
ALT: 20 U/L (ref 0–35)
AST: 18 U/L (ref 0–37)
Albumin: 4.2 g/dL (ref 3.5–5.2)
Alkaline Phosphatase: 81 U/L (ref 39–117)
Bilirubin, Direct: 0.1 mg/dL (ref 0.0–0.3)
TOTAL PROTEIN: 7.4 g/dL (ref 6.0–8.3)
Total Bilirubin: 0.7 mg/dL (ref 0.2–1.2)

## 2019-01-25 LAB — LDL CHOLESTEROL, DIRECT: Direct LDL: 127 mg/dL

## 2019-01-25 LAB — HEMOGLOBIN A1C: Hgb A1c MFr Bld: 5.7 % (ref 4.6–6.5)

## 2019-01-25 NOTE — Assessment & Plan Note (Signed)
?   Enlarging, for repeat thyroid u/s

## 2019-01-25 NOTE — Assessment & Plan Note (Signed)
stable overall by history and exam, recent data reviewed with pt, and pt to continue medical treatment as before,  to f/u any worsening symptoms or concerns  

## 2019-01-25 NOTE — Assessment & Plan Note (Signed)

## 2019-01-25 NOTE — Progress Notes (Signed)
Subjective:    Patient ID: Maria [name], female    DOB: 1956/04/02, 63 y.o.   MRN: [id number]  HPI Here for wellness and f/u;  Overall doing ok;  Pt denies Chest pain, worsening SOB, DOE, wheezing, orthopnea, PND, worsening LE edema, palpitations, dizziness or syncope.  Pt denies neurological change such as new headache, facial or extremity weakness.  Pt denies polydipsia, polyuria, or low sugar symptoms. Pt states overall good compliance with treatment and medications, good tolerability, and has been trying to follow appropriate diet.  Pt denies worsening depressive symptoms, suicidal ideation or panic. No fever, night sweats, wt loss, loss of appetite, or other constitutional symptoms.  Pt states good ability with ADL's, has low fall risk, home safety reviewed and adequate, no other significant changes in hearing or vision, and only occasionally active with exercise. Also with c/o ST x 6 wks without fever, allergy or reflux worsening, and left lower anterior neck swelling seems larger in area.  Asks to have repeat thyroid u/s. Also has ongoing right knee pain, has seen ortho in the past for meniscal tear, but now pain returning, but without swelling, giveaways or falls Past Medical History:  Diagnosis Date  . Acute lymphadenitis 2011  . ALLERGIC RHINITIS 08/10/2007  . ANXIETY 12/06/2007  . Atherosclerotic peripheral vascular disease ([place]) 06/13/2013   Aorta on CT June 2014  . BACK PAIN 12/06/2007  . Cellulitis 2011  . Cervical disc disease 03/09/2012  . Chronic pain 03/09/2012  . DEPRESSION 12/06/2007  . DIVERTICULOSIS, [place] 12/06/2007  . GERD 12/06/2007  . Hepatitis    age 89 hepatitis A  . HYPERTENSION 12/06/2007  . Impaired glucose tolerance 03/13/2012  . LOW BACK PAIN 12/06/2007  . Lumbar disc disease 03/09/2012  . Mesenteric adenitis   . MRSA 2006  . Pain    by breast area  . Pain in joint, multiple sites 04/21/2010  . PARONYCHIA, FINGER 08/13/2009  . Pneumonia sept 2015   walking  pneumonia, sepsis   . RASH-NONVESICULAR 05/09/2008  . 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 SHOULDER PAIN, LEFT 05/09/2008  . SINUSITIS- ACUTE-NOS 05/09/2008  . THORACIC/LUMBOSACRAL NEURITIS/RADICULITIS UNSPEC 12/28/2008   Past Surgical History:  Procedure Laterality Date  . ABDOMINAL HYSTERECTOMY  1999   1 ovary left  . bloo clot removed from neck   may 25th , june 2. 2010  . [place] SURGERY  may 24th 2010  . COLONOSCOPY WITH PROPOFOL N/A 07/10/2016   Procedure: COLONOSCOPY WITH PROPOFOL;  Surgeon: Manus Gunning, MD;  Location: [facility];  Service: Gastroenterology;  Laterality: N/A;  . ESOPHAGOGASTRODUODENOSCOPY (EGD) WITH PROPOFOL N/A 07/10/2016   Procedure: ESOPHAGOGASTRODUODENOSCOPY (EGD) WITH PROPOFOL;  Surgeon: Manus Gunning, MD;  Location: [facility];  Service: Gastroenterology;  Laterality: N/A;  . s/p ovary cyst    . s/p right knee arthroscopy     Dr. Mardelle Matte ortho    reports that she quit smoking about 10 years ago. She quit after 30.00 years of use. She has never used smokeless tobacco. She reports that she does not drink alcohol or use drugs. family history includes Asthma in her daughter and sister; Cancer in her maternal uncle and another family member; Diabetes in her mother; Heart disease in her mother; Heart failure in her mother; Hypertension in her father and mother; Liver cancer in her paternal grandmother. Allergies  Allergen Reactions  . Amoxicillin-Pot Clavulanate Nausea And Vomiting    Projectile vomiting Has patient had a PCN reaction causing immediate rash, facial/tongue/throat  swelling, SOB or lightheadedness with hypotension: no Has patient had a PCN reaction causing severe rash involving mucus membranes or skin necrosis: No Has patient had a PCN reaction that required hospitalization: No Has patient had a PCN reaction occurring within the last 10 years: 72-30 yrs old If all of the above answers are "NO", then may proceed with  Cephalosporin use.   . Adhesive [Tape] Other (See Comments)    Tears skin off - use paper tape   . Codeine Other (See Comments)    hallucinations  . Lactose Intolerance (Gi) Nausea And Vomiting  . Morphine And Related Nausea And Vomiting and Other (See Comments)    Migraine headaches  . Vicodin [Hydrocodone-Acetaminophen] Other (See Comments)    hallucinations  . Latex Rash   Current Outpatient Medications on File Prior to Visit  Medication Sig Dispense Refill  . acetaminophen (TYLENOL) 325 MG tablet Take 650 mg by mouth every 6 (six) hours as needed for headache (pain).    . clonazePAM (KLONOPIN) 1 MG tablet TAKE 1 TABLET BY MOUTH 2 TIMES DAILY AS NEEDED FOR ANXIETY (Patient taking differently: Take 1 mg by mouth 2 (two) times daily as needed for anxiety. ) 60 tablet 5  . cyclobenzaprine (FLEXERIL) 10 MG tablet TAKE 1 TABLET BY MOUTH 2 TIMES A DAY AS NEEDED FOR MUSCLE SPASMS (Patient taking differently: Take 10 mg by mouth daily. ) 180 tablet 0  . dicyclomine (BENTYL) 10 MG capsule Take 1 capsule (10 mg total) 4 (four) times daily -  before meals and at bedtime by mouth. (Patient taking differently: Take 10 mg by mouth 4 (four) times daily as needed for spasms. ) 120 capsule 2  . furosemide (LASIX) 40 MG tablet TAKE 1 TABLET BY MOUTH DAILY (Patient taking differently: Take 40 mg by mouth daily as needed (hands and ankle swelling). ) 90 tablet 0  . losartan (COZAAR) 50 MG tablet TAKE 1 TABLET BY MOUTH EVERY DAY (Patient taking differently: Take 50 mg by mouth daily. ) 90 tablet 0  . ondansetron (ZOFRAN) 4 MG tablet TAKE 1 TABLET BY MOUTH EVERY 8 HOURS AS NEEDED FOR NAUSEA OR VOMITING (Patient taking differently: Take 4 mg by mouth every 8 (eight) hours as needed for nausea or vomiting. ) 40 tablet 0  . Oxycodone HCl 10 MG TABS Take 1 tablet (10 mg total) 4 (four) times daily by mouth. Per [facility] (Patient taking differently: Take 10 mg by mouth 5 (five) times daily. Per [facility]) 30 tablet 0  . pantoprazole (PROTONIX) 40 MG tablet TAKE 1 TABLET BY MOUTH EVERY DAY (Patient taking differently: Take 40 mg by mouth daily. ) 90 tablet 0  . potassium chloride (K-DUR) 10 MEQ tablet TAKE 1 TABLET BY MOUTH DAILY (Patient taking differently: Take 10 mEq by mouth every other day. ) 90 tablet 0  . sertraline (ZOLOFT) 100 MG tablet Take 1.5 tablets (150 mg total) by mouth daily. (Patient taking differently: Take 100 mg by mouth daily. ) 135 tablet 3  . Tetrahydrozoline HCl (VISINE OP) Place 1 drop into both eyes daily.    Marland Kitchen triamcinolone (NASACORT) 55 MCG/ACT AERO nasal inhaler Place 2 sprays into the nose daily. (Patient taking differently: Place 2 sprays into the nose daily as needed (sneezing/runny nose). ) 1 Inhaler 12  . triamcinolone cream (KENALOG) 0.1 % Apply 1 application 2 (two) times daily as needed topically (rash). (Patient taking differently: Apply 1 application topically every evening. For dry skin on legs)  30 g 11  . [DISCONTINUED] DULoxetine (CYMBALTA) 60 MG capsule Take 60 mg by mouth daily.      . [DISCONTINUED] fexofenadine (ALLEGRA) 180 MG tablet Take 180 mg by mouth daily.       No current facility-administered medications on file prior to visit.    Review of Systems Constitutional: Negative for other unusual diaphoresis, sweats, appetite or weight changes HENT: Negative for other worsening hearing loss, ear pain, facial swelling, mouth sores or neck stiffness.   Eyes: Negative for other worsening pain, redness or other visual disturbance.  Respiratory: Negative for other stridor or swelling Cardiovascular: Negative for other palpitations or other chest pain  Gastrointestinal: Negative for worsening diarrhea or loose stools, blood in stool, distention or other pain Genitourinary: Negative for hematuria, flank pain or other change in urine volume.  Musculoskeletal: Negative for myalgias or other joint swelling.  Skin: Negative for other color change,  or other wound or worsening drainage.  Neurological: Negative for other syncope or numbness. Hematological: Negative for other adenopathy or swelling Psychiatric/Behavioral: Negative for hallucinations, other worsening agitation, SI, self-injury, or new decreased concentration All other system neg per pt    Objective:   Physical Exam BP 126/86   Pulse 98   Temp 98.2 F (36.8 C) (Oral)   Ht 5\' 3"  (1.6 m)   Wt (!) 344 lb (156 kg)   SpO2 93%   BMI 60.94 kg/m  VS noted,  Constitutional: Pt is oriented to person, place, and time. Appears well-developed and well-nourished, in no significant distress and comfortable Head: Normocephalic and atraumatic  Eyes: Conjunctivae and EOM are normal. Pupils are equal, round, and reactive to light Right Ear: External ear normal without discharge Left Ear: External ear normal without discharge Nose: Nose without discharge or deformity Mouth/Throat: Oropharynx is without other ulcerations and moist  Neck: Normal range of motion. Neck supple. No JVD present. No tracheal deviation present or significant neck LA or mass Cardiovascular: Normal rate, regular rhythm, normal heart sounds and intact distal pulses.   Pulmonary/Chest: WOB normal and breath sounds without rales or wheezing  Abdominal: Soft. Bowel sounds are normal. NT. No HSM  Musculoskeletal: Normal range of motion. Exhibits no edema Lymphadenopathy: Has no other cervical adenopathy.  Neurological: Pt is alert and oriented to person, place, and time. Pt has normal reflexes. No cranial nerve deficit. Motor grossly intact, Gait intact Skin: Skin is warm and dry. No rash noted or new ulcerations Psychiatric:  Has normal mood and affect. Behavior is normal without agitation No other exam findings Lab Results  Component Value Date   WBC 7.8 01/25/2019   HGB 13.3 01/25/2019   HCT 39.7 01/25/2019   PLT 206.0 01/25/2019   GLUCOSE 106 (H) 01/25/2019   CHOL 199 01/25/2019   TRIG 278.0 (H)  01/25/2019   HDL 31.70 (L) 01/25/2019   LDLDIRECT 127.0 01/25/2019   LDLCALC 104 (H) 11/08/2017   ALT 20 01/25/2019   AST 18 01/25/2019   NA 138 01/25/2019   K 4.0 01/25/2019   CL 102 01/25/2019   CREATININE 0.92 01/25/2019   BUN 12 01/25/2019   CO2 27 01/25/2019   TSH 2.58 01/25/2019   INR 1.11 01/15/2018   HGBA1C 5.7 01/25/2019       Assessment & Plan:

## 2019-01-25 NOTE — Patient Instructions (Signed)
Please continue all other medications as before, and refills have been done if requested.  Please have the pharmacy call with any other refills you may need.  Please continue your efforts at being more active, low cholesterol diet, and weight control.  You are otherwise up to date with prevention measures today.  Please keep your appointments with your specialists as you may have planned  You will be contacted regarding the referral for: thyroid ultrasound  Please make an appt with Sports Medicine in this office for the right knee pain  Please go to the LAB in the Basement (turn left off the elevator) for the tests to be done today  You will be contacted by phone if any changes need to be made immediately.  Otherwise, you will receive a letter about your results with an explanation, but please check with MyChart first.  Please remember to sign up for MyChart if you have not done so, as this will be important to you in the future with finding out test results, communicating by private email, and scheduling acute appointments online when needed.  Please return in 6 months, or sooner if needed

## 2019-01-26 ENCOUNTER — Telehealth: Payer: Self-pay

## 2019-01-26 ENCOUNTER — Encounter: Payer: Self-pay | Admitting: Internal Medicine

## 2019-01-26 ENCOUNTER — Other Ambulatory Visit: Payer: Self-pay | Admitting: Internal Medicine

## 2019-01-26 MED ORDER — ROSUVASTATIN CALCIUM 10 MG PO TABS: 10.0000 mg | ORAL_TABLET | Freq: Every day | ORAL | 3 refills | Status: DC

## 2019-01-26 NOTE — Telephone Encounter (Signed)
-----   Message from Biagio Borg, MD sent at 01/26/2019  8:34 AM EST ----- Letter sent, cont same tx except   The test results show that your current treatment is OK, except the LDL cholesterol is still mildly elevated.  Please start a low dose cholesterol medication called crestor 10 mg per day, and follow a lower cholesterol diet. This will help reduce your risk of heart disease and stroke in the future.  I will send the prescription, and you should hear from the office as well.   Maria [name] to please inform pt, I will do rx

## 2019-01-26 NOTE — Telephone Encounter (Signed)
Pt has been informed of results and expressed understanding.  °

## 2019-02-02 ENCOUNTER — Ambulatory Visit: Payer: Medicare Other | Admitting: Nurse Practitioner

## 2019-02-02 ENCOUNTER — Ambulatory Visit
Admission: RE | Admit: 2019-02-02 | Discharge: 2019-02-02 | Disposition: A | Discharge status: Home / Self Care | Payer: Medicare Other | Source: Ambulatory Visit | Attending: Internal Medicine | Admitting: Internal Medicine

## 2019-02-02 DIAGNOSIS — E041 Nontoxic single thyroid nodule: Secondary | ICD-10-CM | POA: Diagnosis not present

## 2019-02-03 ENCOUNTER — Encounter: Payer: Self-pay | Admitting: Internal Medicine

## 2019-02-06 ENCOUNTER — Telehealth: Payer: Self-pay | Admitting: Internal Medicine

## 2019-02-06 NOTE — Telephone Encounter (Signed)
Copied from [place] [id number]. Topic: Quick Communication - See Telephone Encounter >> Feb 06, 2019  4:55 PM Sheran Luz wrote: CRM for notification. See Telephone encounter for: 02/06/19.  Patient called to check status of imaging results from 2/13- Advised patient a letter had been sent, patient is requesting a call back from nurse as well to discuss results. Please advise.

## 2019-02-07 NOTE — Telephone Encounter (Signed)
Please advise 

## 2019-02-07 NOTE — Telephone Encounter (Signed)
2/14 thyroid u/s was negative for change  No need for change in tx

## 2019-02-07 NOTE — Telephone Encounter (Signed)
Pt has been informed of results and expressed understanding.  °

## 2019-02-08 DIAGNOSIS — Z713 Dietary counseling and surveillance: Secondary | ICD-10-CM | POA: Diagnosis not present

## 2019-02-22 DIAGNOSIS — M25552 Pain in left hip: Secondary | ICD-10-CM | POA: Diagnosis not present

## 2019-02-22 DIAGNOSIS — Z79891 Long term (current) use of opiate analgesic: Secondary | ICD-10-CM | POA: Diagnosis not present

## 2019-02-22 DIAGNOSIS — M25561 Pain in right knee: Secondary | ICD-10-CM | POA: Diagnosis not present

## 2019-02-22 DIAGNOSIS — G8929 Other chronic pain: Secondary | ICD-10-CM | POA: Diagnosis not present

## 2019-02-22 DIAGNOSIS — M25512 Pain in left shoulder: Secondary | ICD-10-CM | POA: Diagnosis not present

## 2019-02-22 DIAGNOSIS — M542 Cervicalgia: Secondary | ICD-10-CM | POA: Diagnosis not present

## 2019-02-22 DIAGNOSIS — G894 Chronic pain syndrome: Secondary | ICD-10-CM | POA: Diagnosis not present

## 2019-03-22 DIAGNOSIS — G8929 Other chronic pain: Secondary | ICD-10-CM | POA: Diagnosis not present

## 2019-03-22 DIAGNOSIS — G894 Chronic pain syndrome: Secondary | ICD-10-CM | POA: Diagnosis not present

## 2019-03-22 DIAGNOSIS — M542 Cervicalgia: Secondary | ICD-10-CM | POA: Diagnosis not present

## 2019-03-22 DIAGNOSIS — M25552 Pain in left hip: Secondary | ICD-10-CM | POA: Diagnosis not present

## 2019-03-22 DIAGNOSIS — M25512 Pain in left shoulder: Secondary | ICD-10-CM | POA: Diagnosis not present

## 2019-03-22 DIAGNOSIS — M25561 Pain in right knee: Secondary | ICD-10-CM | POA: Diagnosis not present

## 2019-03-22 DIAGNOSIS — Z79891 Long term (current) use of opiate analgesic: Secondary | ICD-10-CM | POA: Diagnosis not present

## 2019-04-07 ENCOUNTER — Other Ambulatory Visit: Payer: Self-pay | Admitting: Internal Medicine

## 2019-04-11 ENCOUNTER — Other Ambulatory Visit: Payer: Self-pay | Admitting: Internal Medicine

## 2019-04-12 NOTE — Telephone Encounter (Signed)
Done erx 

## 2019-04-25 ENCOUNTER — Other Ambulatory Visit: Payer: Self-pay | Admitting: Internal Medicine

## 2019-04-25 DIAGNOSIS — G8929 Other chronic pain: Secondary | ICD-10-CM | POA: Diagnosis not present

## 2019-04-25 DIAGNOSIS — M545 Low back pain: Secondary | ICD-10-CM | POA: Diagnosis not present

## 2019-04-25 DIAGNOSIS — M542 Cervicalgia: Secondary | ICD-10-CM | POA: Diagnosis not present

## 2019-04-25 DIAGNOSIS — Z79891 Long term (current) use of opiate analgesic: Secondary | ICD-10-CM | POA: Diagnosis not present

## 2019-04-25 DIAGNOSIS — M25511 Pain in right shoulder: Secondary | ICD-10-CM | POA: Diagnosis not present

## 2019-04-25 DIAGNOSIS — M25552 Pain in left hip: Secondary | ICD-10-CM | POA: Diagnosis not present

## 2019-04-25 DIAGNOSIS — M25551 Pain in right hip: Secondary | ICD-10-CM | POA: Diagnosis not present

## 2019-04-25 DIAGNOSIS — G894 Chronic pain syndrome: Secondary | ICD-10-CM | POA: Diagnosis not present

## 2019-05-23 ENCOUNTER — Ambulatory Visit (INDEPENDENT_AMBULATORY_CARE_PROVIDER_SITE_OTHER): Payer: Medicare Other | Admitting: Internal Medicine

## 2019-05-23 DIAGNOSIS — R739 Hyperglycemia, unspecified: Secondary | ICD-10-CM

## 2019-05-23 DIAGNOSIS — I10 Essential (primary) hypertension: Secondary | ICD-10-CM | POA: Diagnosis not present

## 2019-05-23 DIAGNOSIS — K219 Gastro-esophageal reflux disease without esophagitis: Secondary | ICD-10-CM | POA: Diagnosis not present

## 2019-05-23 DIAGNOSIS — R252 Cramp and spasm: Secondary | ICD-10-CM

## 2019-05-23 DIAGNOSIS — R221 Localized swelling, mass and lump, neck: Secondary | ICD-10-CM

## 2019-05-23 DIAGNOSIS — E785 Hyperlipidemia, unspecified: Secondary | ICD-10-CM

## 2019-05-23 MED ORDER — LOSARTAN POTASSIUM 100 MG PO TABS: 100.0000 mg | ORAL_TABLET | Freq: Every day | ORAL | 3 refills | Status: DC

## 2019-05-23 NOTE — Patient Instructions (Addendum)
OK to increase the losartan to 100 mg per day  Please continue all other medications as before, and refills have been done if requested.  Please have the pharmacy call with any other refills you may need.  Please continue your efforts at being more active, low cholesterol diet, and weight control.  Please keep your appointments with your specialists as you may have planned

## 2019-05-23 NOTE — Progress Notes (Signed)
Patient ID: Maria [name], female   DOB: [birth year], 63 y.o.   MRN: [id number]  Virtual Visit via Video Note  I connected with Maria [name] on 05/23/19 at  2:20 PM EDT by a video enabled telemedicine application and verified that I am speaking with the correct person using two identifiers.  Location: Patient: at home Provider: at office   I discussed the limitations of evaluation and management by telemedicine and the availability of in person appointments. The patient expressed understanding and agreed to proceed.  History of Present Illness: Here with c/o new finding of a lump in the fatty tissue of the anterior neck under the chin, not sure how long it has been there; no pain or overlying skin change or fever or wt loss.  BP today was 173/104, HR 95 and is often higher like this at home.  Tolerating statin.  Does have occasional leg cramps at night, asks for muscle relaxer.  Denies worsening reflux, abd pain, dysphagia, n/v, bowel change or blood, but needs PPI refill as well.  Pt denies chest pain, increased sob or doe, wheezing, orthopnea, PND, increased LE swelling, palpitations, dizziness or syncope.  Pt denies new neurological symptoms such as new headache, or facial or extremity weakness or numbness   Pt denies polydipsia, polyuria Past Medical History:  Diagnosis Date  . Acute lymphadenitis 2011  . ALLERGIC RHINITIS 08/10/2007  . ANXIETY 12/06/2007  . Atherosclerotic peripheral vascular disease ([place]) 06/13/2013   Aorta on CT June 2014  . BACK PAIN 12/06/2007  . Cellulitis 2011  . Cervical disc disease 03/09/2012  . Chronic pain 03/09/2012  . DEPRESSION 12/06/2007  . DIVERTICULOSIS, [place] 12/06/2007  . GERD 12/06/2007  . Hepatitis    age 73 hepatitis A  . HYPERTENSION 12/06/2007  . Impaired glucose tolerance 03/13/2012  . LOW BACK PAIN 12/06/2007  . Lumbar disc disease 03/09/2012  . Mesenteric adenitis   . MRSA 2006  . Pain    by breast area  . Pain in joint, multiple sites  04/21/2010  . PARONYCHIA, FINGER 08/13/2009  . Pneumonia sept 2015   walking pneumonia, sepsis   . RASH-NONVESICULAR 05/09/2008  . 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 SHOULDER PAIN, LEFT 05/09/2008  . SINUSITIS- ACUTE-NOS 05/09/2008  . THORACIC/LUMBOSACRAL NEURITIS/RADICULITIS UNSPEC 12/28/2008   Past Surgical History:  Procedure Laterality Date  . ABDOMINAL HYSTERECTOMY  1999   1 ovary left  . bloo clot removed from neck   may 25th , june 2. 2010  . [place] SURGERY  may 24th 2010  . COLONOSCOPY WITH PROPOFOL N/A 07/10/2016   Procedure: COLONOSCOPY WITH PROPOFOL;  Surgeon: Manus Gunning, MD;  Location: [facility];  Service: Gastroenterology;  Laterality: N/A;  . ESOPHAGOGASTRODUODENOSCOPY (EGD) WITH PROPOFOL N/A 07/10/2016   Procedure: ESOPHAGOGASTRODUODENOSCOPY (EGD) WITH PROPOFOL;  Surgeon: Manus Gunning, MD;  Location: [facility];  Service: Gastroenterology;  Laterality: N/A;  . s/p ovary cyst    . s/p right knee arthroscopy     Dr. Mardelle Matte ortho    reports that she quit smoking about 10 years ago. She quit after 30.00 years of use. She has never used smokeless tobacco. She reports that she does not drink alcohol or use drugs. family history includes Asthma in her daughter and sister; Cancer in her maternal uncle and another family member; Diabetes in her mother; Heart disease in her mother; Heart failure in her mother; Hypertension in her father and mother; Liver cancer in her paternal grandmother. Allergies  Allergen  Reactions  . Amoxicillin-Pot Clavulanate Nausea And Vomiting    Projectile vomiting Has patient had a PCN reaction causing immediate rash, facial/tongue/throat swelling, SOB or lightheadedness with hypotension: no Has patient had a PCN reaction causing severe rash involving mucus membranes or skin necrosis: No Has patient had a PCN reaction that required hospitalization: No Has patient had a PCN reaction occurring within the last 10 years:  79-31 yrs old If all of the above answers are "NO", then may proceed with Cephalosporin use.   . Adhesive [Tape] Other (See Comments)    Tears skin off - use paper tape   . Codeine Other (See Comments)    hallucinations  . Lactose Intolerance (Gi) Nausea And Vomiting  . Morphine And Related Nausea And Vomiting and Other (See Comments)    Migraine headaches  . Vicodin [Hydrocodone-Acetaminophen] Other (See Comments)    hallucinations  . Latex Rash   Current Outpatient Medications on File Prior to Visit  Medication Sig Dispense Refill  . acetaminophen (TYLENOL) 325 MG tablet Take 650 mg by mouth every 6 (six) hours as needed for headache (pain).    . clonazePAM (KLONOPIN) 1 MG tablet TAKE 1 TABLET BY MOUTH 2 TIMES DAILY AS NEEDED FOR ANXIETY 60 tablet 5  . cyclobenzaprine (FLEXERIL) 10 MG tablet TAKE 1 TABLET BY MOUTH 2 TIMES A DAY AS NEEDED FOR MUSCLE SPASMS (Patient taking differently: Take 10 mg by mouth daily. ) 180 tablet 0  . dicyclomine (BENTYL) 10 MG capsule Take 1 capsule (10 mg total) 4 (four) times daily -  before meals and at bedtime by mouth. (Patient taking differently: Take 10 mg by mouth 4 (four) times daily as needed for spasms. ) 120 capsule 2  . furosemide (LASIX) 40 MG tablet TAKE 1 TABLET BY MOUTH DAILY (Patient taking differently: Take 40 mg by mouth daily as needed (hands and ankle swelling). ) 90 tablet 0  . ondansetron (ZOFRAN) 4 MG tablet TAKE 1 TABLET BY MOUTH EVERY 8 HOURS AS NEEDED FOR NAUSEA OR VOMITING (Patient taking differently: Take 4 mg by mouth every 8 (eight) hours as needed for nausea or vomiting. ) 40 tablet 0  . Oxycodone HCl 10 MG TABS Take 1 tablet (10 mg total) 4 (four) times daily by mouth. Per [facility] (Patient taking differently: Take 10 mg by mouth 5 (five) times daily. Per [facility]) 30 tablet 0  . pantoprazole (PROTONIX) 40 MG tablet TAKE 1 TABLET BY MOUTH ONCE DAILY 90 tablet 0  . potassium chloride (K-DUR) 10 MEQ tablet  TAKE 1 TABLET BY MOUTH DAILY (Patient taking differently: Take 10 mEq by mouth every other day. ) 90 tablet 0  . rosuvastatin (CRESTOR) 10 MG tablet Take 1 tablet (10 mg total) by mouth daily. 90 tablet 3  . sertraline (ZOLOFT) 100 MG tablet Take 1.5 tablets (150 mg total) by mouth daily. (Patient taking differently: Take 100 mg by mouth daily. ) 135 tablet 3  . Tetrahydrozoline HCl (VISINE OP) Place 1 drop into both eyes daily.    Marland Kitchen triamcinolone (NASACORT) 55 MCG/ACT AERO nasal inhaler Place 2 sprays into the nose daily. (Patient taking differently: Place 2 sprays into the nose daily as needed (sneezing/runny nose). ) 1 Inhaler 12  . triamcinolone cream (KENALOG) 0.1 % Apply 1 application 2 (two) times daily as needed topically (rash). (Patient taking differently: Apply 1 application topically every evening. For dry skin on legs) 30 g 11  . [DISCONTINUED] DULoxetine (CYMBALTA) 60 MG capsule Take  60 mg by mouth daily.      . [DISCONTINUED] fexofenadine (ALLEGRA) 180 MG tablet Take 180 mg by mouth daily.       No current facility-administered medications on file prior to visit.     Observations/Objective: Alert, NAD, appropriate mood and affect, resps normal, cn 2-12 intact, moves all 4s, no visible rash or swelling Lab Results  Component Value Date   WBC 7.8 01/25/2019   HGB 13.3 01/25/2019   HCT 39.7 01/25/2019   PLT 206.0 01/25/2019   GLUCOSE 106 (H) 01/25/2019   CHOL 199 01/25/2019   TRIG 278.0 (H) 01/25/2019   HDL 31.70 (L) 01/25/2019   LDLDIRECT 127.0 01/25/2019   LDLCALC 104 (H) 11/08/2017   ALT 20 01/25/2019   AST 18 01/25/2019   NA 138 01/25/2019   K 4.0 01/25/2019   CL 102 01/25/2019   CREATININE 0.92 01/25/2019   BUN 12 01/25/2019   CO2 27 01/25/2019   TSH 2.58 01/25/2019   INR 1.11 01/15/2018   HGBA1C 5.7 01/25/2019   Assessment and Plan: See notes  Follow Up Instructions: See notes   I discussed the assessment and treatment plan with the patient. The patient  was provided an opportunity to ask questions and all were answered. The patient agreed with the plan and demonstrated an understanding of the instructions.   The patient was advised to call back or seek an in-person evaluation if the symptoms worsen or if the condition fails to improve as anticipated.  Cathlean Cower, MD

## 2019-05-24 ENCOUNTER — Encounter: Payer: Self-pay | Admitting: Internal Medicine

## 2019-05-24 DIAGNOSIS — E785 Hyperlipidemia, unspecified: Secondary | ICD-10-CM

## 2019-05-24 DIAGNOSIS — R252 Cramp and spasm: Secondary | ICD-10-CM | POA: Insufficient documentation

## 2019-05-24 DIAGNOSIS — R221 Localized swelling, mass and lump, neck: Secondary | ICD-10-CM | POA: Insufficient documentation

## 2019-05-24 HISTORY — DX: Hyperlipidemia, unspecified: E78.5

## 2019-05-24 NOTE — Assessment & Plan Note (Addendum)
This appears to be a subq nodule in the fatty tissue under the skin, to continue to monitor, and f/u at next visit  Note:  Total time for pt hx, exam, review of record with pt in the room, determination of diagnoses and plan for further eval and tx is > 40 min, with over 50% spent in coordination and counseling of patient including the differential dx, tx, further evaluation and other management of lump in neck, gerd, HTN, HLD, hyperglycemia, leg cramps

## 2019-05-24 NOTE — Assessment & Plan Note (Signed)
uncontrolleed - to increase the losartan 100 qd,  to f/u any worsening symptoms or concerns

## 2019-05-24 NOTE — Assessment & Plan Note (Signed)
stable overall by history and exam, recent data reviewed with pt, and pt to continue medical treatment as before,  to f/u any worsening symptoms or concerns, to continue statin though 2 wks off to see if related to leg cramps may be warranted

## 2019-05-24 NOTE — Assessment & Plan Note (Signed)
Mild, to restart PPI

## 2019-05-24 NOTE — Assessment & Plan Note (Signed)
Mild to mod, for muscle relaxer prn,  to f/u any worsening symptoms or concerns

## 2019-05-24 NOTE — Assessment & Plan Note (Signed)
stable overall by history and exam, recent data reviewed with pt, and pt to continue medical treatment as before,  to f/u any worsening symptoms or concerns  

## 2019-05-25 DIAGNOSIS — M25552 Pain in left hip: Secondary | ICD-10-CM | POA: Diagnosis not present

## 2019-05-25 DIAGNOSIS — M25512 Pain in left shoulder: Secondary | ICD-10-CM | POA: Diagnosis not present

## 2019-05-25 DIAGNOSIS — G8929 Other chronic pain: Secondary | ICD-10-CM | POA: Diagnosis not present

## 2019-05-25 DIAGNOSIS — M25561 Pain in right knee: Secondary | ICD-10-CM | POA: Diagnosis not present

## 2019-05-25 DIAGNOSIS — G894 Chronic pain syndrome: Secondary | ICD-10-CM | POA: Diagnosis not present

## 2019-05-25 DIAGNOSIS — M542 Cervicalgia: Secondary | ICD-10-CM | POA: Diagnosis not present

## 2019-05-25 DIAGNOSIS — Z79891 Long term (current) use of opiate analgesic: Secondary | ICD-10-CM | POA: Diagnosis not present

## 2019-06-22 DIAGNOSIS — M545 Low back pain: Secondary | ICD-10-CM | POA: Diagnosis not present

## 2019-06-22 DIAGNOSIS — G894 Chronic pain syndrome: Secondary | ICD-10-CM | POA: Diagnosis not present

## 2019-06-22 DIAGNOSIS — G8929 Other chronic pain: Secondary | ICD-10-CM | POA: Diagnosis not present

## 2019-06-22 DIAGNOSIS — M25512 Pain in left shoulder: Secondary | ICD-10-CM | POA: Diagnosis not present

## 2019-06-22 DIAGNOSIS — M25561 Pain in right knee: Secondary | ICD-10-CM | POA: Diagnosis not present

## 2019-06-22 DIAGNOSIS — Z79891 Long term (current) use of opiate analgesic: Secondary | ICD-10-CM | POA: Diagnosis not present

## 2019-06-22 DIAGNOSIS — M25511 Pain in right shoulder: Secondary | ICD-10-CM | POA: Diagnosis not present

## 2019-06-22 DIAGNOSIS — M542 Cervicalgia: Secondary | ICD-10-CM | POA: Diagnosis not present

## 2019-07-13 ENCOUNTER — Encounter: Payer: Self-pay | Admitting: Gastroenterology

## 2019-07-20 DIAGNOSIS — M25551 Pain in right hip: Secondary | ICD-10-CM | POA: Diagnosis not present

## 2019-07-20 DIAGNOSIS — Z79891 Long term (current) use of opiate analgesic: Secondary | ICD-10-CM | POA: Diagnosis not present

## 2019-07-20 DIAGNOSIS — M79661 Pain in right lower leg: Secondary | ICD-10-CM | POA: Diagnosis not present

## 2019-07-20 DIAGNOSIS — M25561 Pain in right knee: Secondary | ICD-10-CM | POA: Diagnosis not present

## 2019-07-20 DIAGNOSIS — G894 Chronic pain syndrome: Secondary | ICD-10-CM | POA: Diagnosis not present

## 2019-07-20 DIAGNOSIS — M542 Cervicalgia: Secondary | ICD-10-CM | POA: Diagnosis not present

## 2019-07-20 DIAGNOSIS — G8929 Other chronic pain: Secondary | ICD-10-CM | POA: Diagnosis not present

## 2019-07-20 DIAGNOSIS — M25552 Pain in left hip: Secondary | ICD-10-CM | POA: Diagnosis not present

## 2019-07-26 ENCOUNTER — Ambulatory Visit (INDEPENDENT_AMBULATORY_CARE_PROVIDER_SITE_OTHER): Payer: Medicare Other | Admitting: Internal Medicine

## 2019-07-26 ENCOUNTER — Encounter: Payer: Self-pay | Admitting: Internal Medicine

## 2019-07-26 DIAGNOSIS — E538 Deficiency of other specified B group vitamins: Secondary | ICD-10-CM

## 2019-07-26 DIAGNOSIS — F329 Major depressive disorder, single episode, unspecified: Secondary | ICD-10-CM

## 2019-07-26 DIAGNOSIS — F32A Depression, unspecified: Secondary | ICD-10-CM

## 2019-07-26 DIAGNOSIS — R070 Pain in throat: Secondary | ICD-10-CM | POA: Diagnosis not present

## 2019-07-26 DIAGNOSIS — R739 Hyperglycemia, unspecified: Secondary | ICD-10-CM

## 2019-07-26 DIAGNOSIS — I10 Essential (primary) hypertension: Secondary | ICD-10-CM

## 2019-07-26 DIAGNOSIS — E559 Vitamin D deficiency, unspecified: Secondary | ICD-10-CM

## 2019-07-26 DIAGNOSIS — E611 Iron deficiency: Secondary | ICD-10-CM

## 2019-07-26 DIAGNOSIS — E785 Hyperlipidemia, unspecified: Secondary | ICD-10-CM

## 2019-07-26 MED ORDER — PANTOPRAZOLE SODIUM 40 MG PO TBEC: 40.0000 mg | DELAYED_RELEASE_TABLET | Freq: Every day | ORAL | 3 refills | Status: DC

## 2019-07-26 MED ORDER — CYCLOBENZAPRINE HCL 10 MG PO TABS: ORAL_TABLET | ORAL | 1 refills | Status: DC

## 2019-07-26 NOTE — Assessment & Plan Note (Signed)
stable overall by history and exam, recent data reviewed with pt, and pt to continue medical treatment as before,  to f/u any worsening symptoms or concerns  

## 2019-07-26 NOTE — Patient Instructions (Signed)
Please continue all other medications as before, and refills have been done if requested.  Please have the pharmacy call with any other refills you may need.  Please continue your efforts at being more active, low cholesterol diet, and weight control.  You are otherwise up to date with prevention measures today.  Please keep your appointments with your specialists as you may have planned  You will be contacted regarding the referral for: ENT  Please return in 6 months, or sooner if needed, with Lab testing done 3-5 days before

## 2019-07-26 NOTE — Assessment & Plan Note (Signed)
[facility] for [facility] per pt request

## 2019-07-26 NOTE — Progress Notes (Signed)
Patient ID: Maria [name], female   DOB: 12-26-1955, 63 y.o.   MRN: [id number]  Virtual Visit via Video Note  I connected with Maria [name] on 07/26/19 at  1:20 PM EDT by a video enabled telemedicine application and verified that I am speaking with the correct person using two identifiers.  Location: Patient: at home Provider: at office   I discussed the limitations of evaluation and management by telemedicine and the availability of in person appointments. The patient expressed understanding and agreed to proceed.  History of Present Illness: Here to f/u; overall doing ok,  Pt denies chest pain, increasing sob or doe, wheezing, orthopnea, PND, increased LE swelling, palpitations, dizziness or syncope.  Pt denies new neurological symptoms such as new headache, or facial or extremity weakness or numbness.  Pt denies polydipsia, polyuria, or low sugar episode.  Pt states overall good compliance with meds, mostly trying to follow appropriate diet.  Denies worsening reflux, abd pain, dysphagia, n/v, bowel change or blood., needs PPI refill, as well as flexeril refill for FMS.  Also has ? Worsening swelling to left post throat, no fever or COVID exposure, asks for new ENT referral.  BP at home 118/58 Past Medical History:  Diagnosis Date  . Acute lymphadenitis 2011  . ALLERGIC RHINITIS 08/10/2007  . ANXIETY 12/06/2007  . Atherosclerotic peripheral vascular disease ([place]) 06/13/2013   Aorta on CT June 2014  . BACK PAIN 12/06/2007  . Cellulitis 2011  . Cervical disc disease 03/09/2012  . Chronic pain 03/09/2012  . DEPRESSION 12/06/2007  . DIVERTICULOSIS, [place] 12/06/2007  . GERD 12/06/2007  . Hepatitis    age 28 hepatitis A  . HLD (hyperlipidemia) 05/24/2019  . HYPERTENSION 12/06/2007  . Impaired glucose tolerance 03/13/2012  . LOW BACK PAIN 12/06/2007  . Lumbar disc disease 03/09/2012  . Mesenteric adenitis   . MRSA 2006  . Pain    by breast area  . Pain in joint, multiple sites 04/21/2010   . PARONYCHIA, FINGER 08/13/2009  . Pneumonia sept 2015   walking pneumonia, sepsis   . RASH-NONVESICULAR 05/09/2008  . 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 SHOULDER PAIN, LEFT 05/09/2008  . SINUSITIS- ACUTE-NOS 05/09/2008  . THORACIC/LUMBOSACRAL NEURITIS/RADICULITIS UNSPEC 12/28/2008   Past Surgical History:  Procedure Laterality Date  . ABDOMINAL HYSTERECTOMY  1999   1 ovary left  . bloo clot removed from neck   may 25th , june 2. 2010  . [place] SURGERY  may 24th 2010  . COLONOSCOPY WITH PROPOFOL N/A 07/10/2016   Procedure: COLONOSCOPY WITH PROPOFOL;  Surgeon: Manus Gunning, MD;  Location: [facility];  Service: Gastroenterology;  Laterality: N/A;  . ESOPHAGOGASTRODUODENOSCOPY (EGD) WITH PROPOFOL N/A 07/10/2016   Procedure: ESOPHAGOGASTRODUODENOSCOPY (EGD) WITH PROPOFOL;  Surgeon: Manus Gunning, MD;  Location: [facility];  Service: Gastroenterology;  Laterality: N/A;  . s/p ovary cyst    . s/p right knee arthroscopy     Dr. Mardelle Matte ortho    reports that she quit smoking about 10 years ago. She quit after 30.00 years of use. She has never used smokeless tobacco. She reports that she does not drink alcohol or use drugs. family history includes Asthma in her daughter and sister; Cancer in her maternal uncle and another family member; Diabetes in her mother; Heart disease in her mother; Heart failure in her mother; Hypertension in her father and mother; Liver cancer in her paternal grandmother. Allergies  Allergen Reactions  . Amoxicillin-Pot Clavulanate Nausea And Vomiting  Projectile vomiting Has patient had a PCN reaction causing immediate rash, facial/tongue/throat swelling, SOB or lightheadedness with hypotension: no Has patient had a PCN reaction causing severe rash involving mucus membranes or skin necrosis: No Has patient had a PCN reaction that required hospitalization: No Has patient had a PCN reaction occurring within the last 10 years: 30-30 yrs  old If all of the above answers are "NO", then may proceed with Cephalosporin use.   . Adhesive [Tape] Other (See Comments)    Tears skin off - use paper tape   . Codeine Other (See Comments)    hallucinations  . Lactose Intolerance (Gi) Nausea And Vomiting  . Morphine And Related Nausea And Vomiting and Other (See Comments)    Migraine headaches  . Vicodin [Hydrocodone-Acetaminophen] Other (See Comments)    hallucinations  . Latex Rash   Current Outpatient Medications on File Prior to Visit  Medication Sig Dispense Refill  . acetaminophen (TYLENOL) 325 MG tablet Take 650 mg by mouth every 6 (six) hours as needed for headache (pain).    . clonazePAM (KLONOPIN) 1 MG tablet TAKE 1 TABLET BY MOUTH 2 TIMES DAILY AS NEEDED FOR ANXIETY 60 tablet 5  . dicyclomine (BENTYL) 10 MG capsule Take 1 capsule (10 mg total) 4 (four) times daily -  before meals and at bedtime by mouth. (Patient taking differently: Take 10 mg by mouth 4 (four) times daily as needed for spasms. ) 120 capsule 2  . furosemide (LASIX) 40 MG tablet TAKE 1 TABLET BY MOUTH DAILY (Patient taking differently: Take 40 mg by mouth daily as needed (hands and ankle swelling). ) 90 tablet 0  . losartan (COZAAR) 100 MG tablet Take 1 tablet (100 mg total) by mouth daily. 90 tablet 3  . ondansetron (ZOFRAN) 4 MG tablet TAKE 1 TABLET BY MOUTH EVERY 8 HOURS AS NEEDED FOR NAUSEA OR VOMITING (Patient taking differently: Take 4 mg by mouth every 8 (eight) hours as needed for nausea or vomiting. ) 40 tablet 0  . Oxycodone HCl 10 MG TABS Take 1 tablet (10 mg total) 4 (four) times daily by mouth. Per [facility] (Patient taking differently: Take 10 mg by mouth 5 (five) times daily. Per [facility]) 30 tablet 0  . potassium chloride (K-DUR) 10 MEQ tablet TAKE 1 TABLET BY MOUTH DAILY (Patient taking differently: Take 10 mEq by mouth every other day. ) 90 tablet 0  . rosuvastatin (CRESTOR) 10 MG tablet Take 1 tablet (10 mg total) by  mouth daily. 90 tablet 3  . sertraline (ZOLOFT) 100 MG tablet Take 1.5 tablets (150 mg total) by mouth daily. (Patient taking differently: Take 100 mg by mouth daily. ) 135 tablet 3  . Tetrahydrozoline HCl (VISINE OP) Place 1 drop into both eyes daily.    Marland Kitchen triamcinolone (NASACORT) 55 MCG/ACT AERO nasal inhaler Place 2 sprays into the nose daily. (Patient taking differently: Place 2 sprays into the nose daily as needed (sneezing/runny nose). ) 1 Inhaler 12  . triamcinolone cream (KENALOG) 0.1 % Apply 1 application 2 (two) times daily as needed topically (rash). (Patient taking differently: Apply 1 application topically every evening. For dry skin on legs) 30 g 11  . [DISCONTINUED] DULoxetine (CYMBALTA) 60 MG capsule Take 60 mg by mouth daily.      . [DISCONTINUED] fexofenadine (ALLEGRA) 180 MG tablet Take 180 mg by mouth daily.       No current facility-administered medications on file prior to visit.     Observations/Objective:  Alert, NAD, appropriate mood and affect, resps normal, cn 2-12 intact, moves all 4s, no visible rash or swelling Lab Results  Component Value Date   WBC 7.8 01/25/2019   HGB 13.3 01/25/2019   HCT 39.7 01/25/2019   PLT 206.0 01/25/2019   GLUCOSE 106 (H) 01/25/2019   CHOL 199 01/25/2019   TRIG 278.0 (H) 01/25/2019   HDL 31.70 (L) 01/25/2019   LDLDIRECT 127.0 01/25/2019   LDLCALC 104 (H) 11/08/2017   ALT 20 01/25/2019   AST 18 01/25/2019   NA 138 01/25/2019   K 4.0 01/25/2019   CL 102 01/25/2019   CREATININE 0.92 01/25/2019   BUN 12 01/25/2019   CO2 27 01/25/2019   TSH 2.58 01/25/2019   INR 1.11 01/15/2018   HGBA1C 5.7 01/25/2019   Assessment and Plan: See notes  Follow Up Instructions: See notes   I discussed the assessment and treatment plan with the patient. The patient was provided an opportunity to ask questions and all were answered. The patient agreed with the plan and demonstrated an understanding of the instructions.   The patient was advised  to call back or seek an in-person evaluation if the symptoms worsen or if the condition fails to improve as anticipated.   Cathlean Cower, MD

## 2019-08-02 ENCOUNTER — Telehealth: Payer: Self-pay | Admitting: Internal Medicine

## 2019-08-02 NOTE — Telephone Encounter (Signed)
Patient states that she has not heard from an ENT yet.  Please follow up with patient in regard.

## 2019-08-14 DIAGNOSIS — R070 Pain in throat: Secondary | ICD-10-CM | POA: Diagnosis not present

## 2019-08-16 ENCOUNTER — Other Ambulatory Visit: Payer: Self-pay | Admitting: Internal Medicine

## 2019-08-16 DIAGNOSIS — Z1231 Encounter for screening mammogram for malignant neoplasm of breast: Secondary | ICD-10-CM

## 2019-08-22 DIAGNOSIS — M25551 Pain in right hip: Secondary | ICD-10-CM | POA: Diagnosis not present

## 2019-08-22 DIAGNOSIS — G8929 Other chronic pain: Secondary | ICD-10-CM | POA: Diagnosis not present

## 2019-08-22 DIAGNOSIS — Z79891 Long term (current) use of opiate analgesic: Secondary | ICD-10-CM | POA: Diagnosis not present

## 2019-08-22 DIAGNOSIS — M79661 Pain in right lower leg: Secondary | ICD-10-CM | POA: Diagnosis not present

## 2019-08-22 DIAGNOSIS — M545 Low back pain: Secondary | ICD-10-CM | POA: Diagnosis not present

## 2019-08-22 DIAGNOSIS — M25512 Pain in left shoulder: Secondary | ICD-10-CM | POA: Diagnosis not present

## 2019-08-22 DIAGNOSIS — G894 Chronic pain syndrome: Secondary | ICD-10-CM | POA: Diagnosis not present

## 2019-08-23 ENCOUNTER — Emergency Department (HOSPITAL_COMMUNITY): Payer: Medicare Other

## 2019-08-23 ENCOUNTER — Emergency Department (HOSPITAL_COMMUNITY)
Admission: EM | Admit: 2019-08-23 | Discharge: 2019-08-23 | Disposition: A | Discharge status: Home / Self Care | Payer: Medicare Other | Attending: Emergency Medicine | Admitting: Emergency Medicine

## 2019-08-23 DIAGNOSIS — B3789 Other sites of candidiasis: Secondary | ICD-10-CM

## 2019-08-23 DIAGNOSIS — Z87891 Personal history of nicotine dependence: Secondary | ICD-10-CM | POA: Insufficient documentation

## 2019-08-23 DIAGNOSIS — E041 Nontoxic single thyroid nodule: Secondary | ICD-10-CM | POA: Insufficient documentation

## 2019-08-23 DIAGNOSIS — B372 Candidiasis of skin and nail: Secondary | ICD-10-CM | POA: Insufficient documentation

## 2019-08-23 DIAGNOSIS — R1084 Generalized abdominal pain: Secondary | ICD-10-CM | POA: Insufficient documentation

## 2019-08-23 DIAGNOSIS — I10 Essential (primary) hypertension: Secondary | ICD-10-CM | POA: Diagnosis not present

## 2019-08-23 DIAGNOSIS — Z79899 Other long term (current) drug therapy: Secondary | ICD-10-CM | POA: Diagnosis not present

## 2019-08-23 DIAGNOSIS — N390 Urinary tract infection, site not specified: Secondary | ICD-10-CM | POA: Diagnosis not present

## 2019-08-23 DIAGNOSIS — K5900 Constipation, unspecified: Secondary | ICD-10-CM | POA: Diagnosis not present

## 2019-08-23 DIAGNOSIS — R109 Unspecified abdominal pain: Secondary | ICD-10-CM | POA: Diagnosis not present

## 2019-08-23 LAB — URINALYSIS, ROUTINE W REFLEX MICROSCOPIC
Bilirubin Urine: NEGATIVE
Glucose, UA: NEGATIVE mg/dL
Hgb urine dipstick: NEGATIVE
Ketones, ur: NEGATIVE mg/dL
Nitrite: NEGATIVE
Protein, ur: NEGATIVE mg/dL
Specific Gravity, Urine: 1.015 (ref 1.005–1.030)
pH: 5 (ref 5.0–8.0)

## 2019-08-23 LAB — COMPREHENSIVE METABOLIC PANEL
ALT: 20 U/L (ref 0–44)
AST: 22 U/L (ref 15–41)
Albumin: 3.8 g/dL (ref 3.5–5.0)
Alkaline Phosphatase: 68 U/L (ref 38–126)
Anion gap: 8 (ref 5–15)
BUN: 16 mg/dL (ref 8–23)
CO2: 28 mmol/L (ref 22–32)
Calcium: 8.5 mg/dL — ABNORMAL LOW (ref 8.9–10.3)
Chloride: 101 mmol/L (ref 98–111)
Creatinine, Ser: 1 mg/dL (ref 0.44–1.00)
GFR calc Af Amer: >60 mL/min (ref >60)
GFR calc non Af Amer: >60 mL/min (ref >60)
Glucose, Bld: 153 mg/dL — ABNORMAL HIGH (ref 70–99)
Potassium: 4.2 mmol/L (ref 3.5–5.1)
Sodium: 137 mmol/L (ref 135–145)
Total Bilirubin: 0.7 mg/dL (ref 0.3–1.2)
Total Protein: 7 g/dL (ref 6.5–8.1)

## 2019-08-23 LAB — PROTIME-INR
INR: 1.1 (ref 0.8–1.2)
Prothrombin Time: 13.7 seconds (ref 11.4–15.2)

## 2019-08-23 LAB — CBC WITH DIFFERENTIAL/PLATELET
Abs Immature Granulocytes: 0.03 10*3/uL (ref 0.00–0.07)
Basophils Absolute: 0.1 10*3/uL (ref 0.0–0.1)
Basophils Relative: 1 %
Eosinophils Absolute: 0.1 10*3/uL (ref 0.0–0.5)
Eosinophils Relative: 1 %
HCT: 41.7 % (ref 36.0–46.0)
Hemoglobin: 13.1 g/dL (ref 12.0–15.0)
Immature Granulocytes: 0 %
Lymphocytes Relative: 30 %
Lymphs Abs: 2.9 10*3/uL (ref 0.7–4.0)
MCH: 29.2 pg (ref 26.0–34.0)
MCHC: 31.4 g/dL (ref 30.0–36.0)
MCV: 93.1 fL (ref 80.0–100.0)
Monocytes Absolute: 0.6 10*3/uL (ref 0.1–1.0)
Monocytes Relative: 6 %
Neutro Abs: 5.9 10*3/uL (ref 1.7–7.7)
Neutrophils Relative %: 62 %
Platelets: 219 10*3/uL (ref 150–400)
RBC: 4.48 MIL/uL (ref 3.87–5.11)
RDW: 13.2 % (ref 11.5–15.5)
WBC: 9.6 10*3/uL (ref 4.0–10.5)
nRBC: 0 % (ref 0.0–0.2)

## 2019-08-23 LAB — LIPASE, BLOOD: Lipase: 40 U/L (ref 11–51)

## 2019-08-23 LAB — LACTIC ACID, PLASMA: Lactic Acid, Venous: 1.3 mmol/L (ref 0.5–1.9)

## 2019-08-23 MED ORDER — IOHEXOL 300 MG/ML  SOLN
100.0000 mL | Freq: Once | INTRAMUSCULAR | Status: AC | PRN
  Administered 2019-08-23: 19:00:00 100 mL via INTRAVENOUS

## 2019-08-23 MED ORDER — SODIUM CHLORIDE 0.9 % IV SOLN
1.0000 g | Freq: Once | INTRAVENOUS | Status: AC
  Administered 2019-08-23: 19:00:00 1 g via INTRAVENOUS
  Filled 2019-08-23: qty 10

## 2019-08-23 MED ORDER — ONDANSETRON 4 MG PO TBDP: 4.0000 mg | ORAL_TABLET | ORAL | 0 refills | Status: DC | PRN

## 2019-08-23 MED ORDER — ONDANSETRON HCL 4 MG/2ML IJ SOLN
4.0000 mg | Freq: Once | INTRAMUSCULAR | Status: AC
  Administered 2019-08-23: 20:00:00 4 mg via INTRAVENOUS
  Filled 2019-08-23: qty 2

## 2019-08-23 MED ORDER — FLUCONAZOLE 150 MG PO TABS: ORAL_TABLET | ORAL | 0 refills | Status: DC

## 2019-08-23 MED ORDER — SODIUM CHLORIDE (PF) 0.9 % IJ SOLN
INTRAMUSCULAR | Status: AC
  Administered 2019-08-23: 19:00:00
  Filled 2019-08-23: qty 50

## 2019-08-23 MED ORDER — HYDROMORPHONE HCL 1 MG/ML IJ SOLN
1.0000 mg | Freq: Once | INTRAMUSCULAR | Status: AC
  Administered 2019-08-23: 1 mg via INTRAVENOUS
  Filled 2019-08-23: qty 1

## 2019-08-23 MED ORDER — CEPHALEXIN 500 MG PO CAPS: 1000.0000 mg | ORAL_CAPSULE | Freq: Two times a day (BID) | ORAL | 0 refills | Status: DC

## 2019-08-23 MED ORDER — MORPHINE SULFATE (PF) 4 MG/ML IV SOLN
4.0000 mg | Freq: Once | INTRAVENOUS | Status: DC
  Filled 2019-08-23: qty 1

## 2019-08-23 NOTE — ED Provider Notes (Signed)
[facility] DEPT Provider Note   CSN: [id number] Arrival date & time: 08/23/19  1112     History   Chief Complaint Chief Complaint  Patient presents with  . Constipation    HPI Maria [name] is a 63 y.o. female.     HPI Patient reports her last normal bowel movement was approximately 9 days ago.  She reports that since that time she has been developing abdominal pain.  She reports that this morning it awakened her with pain that radiated around both sides of her abdomen.  She reports that she felt nauseated but did not start vomiting.  She has been trying a stool softener nightly without any improvement.  She denies any recurrent problems with constipation.  She reports the longest she is gone without having a normal bowel movements usually a couple of days.  She denies pain or burning with urination.  No fevers or chills.  No cough. Past Medical History:  Diagnosis Date  . Acute lymphadenitis 2011  . ALLERGIC RHINITIS 08/10/2007  . ANXIETY 12/06/2007  . Atherosclerotic peripheral vascular disease ([place]) 06/13/2013   Aorta on CT June 2014  . BACK PAIN 12/06/2007  . Cellulitis 2011  . Cervical disc disease 03/09/2012  . Chronic pain 03/09/2012  . DEPRESSION 12/06/2007  . DIVERTICULOSIS, [place] 12/06/2007  . GERD 12/06/2007  . Hepatitis    age 8 hepatitis A  . HLD (hyperlipidemia) 05/24/2019  . HYPERTENSION 12/06/2007  . Impaired glucose tolerance 03/13/2012  . LOW BACK PAIN 12/06/2007  . Lumbar disc disease 03/09/2012  . Mesenteric adenitis   . MRSA 2006  . Pain    by breast area  . Pain in joint, multiple sites 04/21/2010  . PARONYCHIA, FINGER 08/13/2009  . Pneumonia sept 2015   walking pneumonia, sepsis   . RASH-NONVESICULAR 05/09/2008  . 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 SHOULDER PAIN, LEFT 05/09/2008  . SINUSITIS- ACUTE-NOS 05/09/2008  . THORACIC/LUMBOSACRAL NEURITIS/RADICULITIS UNSPEC 12/28/2008    Patient Active Problem List   Diagnosis Date Noted  . Throat pain 07/26/2019  . HLD (hyperlipidemia) 05/24/2019  . Leg cramps 05/24/2019  . Lump in neck 05/24/2019  . Hyperglycemia 01/25/2019  . Left thyroid nodule 01/25/2019  . Jerking movements of extremities 07/20/2018  . Balance disorder 07/20/2018  . Allergic rhinitis 06/15/2018  . Right knee pain 03/21/2018  . OSA (obstructive sleep apnea) 03/21/2018  . Oxygen desaturation 03/21/2018  . Urinary symptom or sign 03/21/2018  . Acquired lymphedema 01/25/2018  . Gait disorder 01/25/2018  . Peripheral edema 01/16/2018  . SOB (shortness of breath) 01/15/2018  . 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 Preventative health care 11/08/2017  . Abdominal pain, epigastric   . Dysphagia   . [place] cancer screening   . Adverse effect of angiotensin-converting enzyme inhibitor 12/05/2015  . Restless leg 10/08/2015  . Dermatitis, stasis 04/26/2015  . Fibromyalgia 01/31/2015  . Acid reflux 10/17/2014  . 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 Post-traumatic osteoarthritis of both knees 09/19/2014  . Spinal stenosis 09/19/2014  . Cervical disc disease 03/09/2012  . Lumbar disc disease 03/09/2012  . Chronic pain 03/09/2012  . Obesity 06/17/2011  . Chronic low back pain 12/28/2008  . Anxiety state 12/06/2007  . Depression 12/06/2007  . Essential hypertension 12/06/2007  . LOW BACK PAIN 12/06/2007    Past Surgical History:  Procedure Laterality Date  . ABDOMINAL HYSTERECTOMY  1999   1 ovary left  . bloo clot removed from neck   may  25th , june 2. 2010  . [place] SURGERY  may 24th 2010  . COLONOSCOPY WITH PROPOFOL N/A 07/10/2016   Procedure: COLONOSCOPY WITH PROPOFOL;  Surgeon: Manus Gunning, MD;  [facility];  Service: Gastroenterology;  Laterality: N/A;  . ESOPHAGOGASTRODUODENOSCOPY (EGD) WITH PROPOFOL N/A 07/10/2016   Procedure: ESOPHAGOGASTRODUODENOSCOPY (EGD) WITH PROPOFOL;  Surgeon: Manus Gunning, MD;  [facility];  Service: Gastroenterology;  Laterality: N/A;  . s/p ovary cyst    . s/p right knee arthroscopy     Dr. Mardelle Matte ortho     OB History   No obstetric history on file.      Home Medications    Prior to Admission medications   Medication Sig Start Date End Date Taking? Authorizing Provider  acetaminophen (TYLENOL) 325 MG tablet Take 650 mg by mouth every 6 (six) hours as needed for headache (pain).   Yes [provider]  clonazePAM (KLONOPIN) 1 MG tablet TAKE 1 TABLET BY MOUTH 2 TIMES DAILY AS NEEDED FOR ANXIETY 04/12/19  Yes Biagio Borg, MD  cyclobenzaprine (FLEXERIL) 10 MG tablet TAKE 1 TABLET BY MOUTH 2 TIMES A DAY AS NEEDED FOR MUSCLE SPASMS 07/26/19  Yes Biagio Borg, MD  furosemide (LASIX) 40 MG tablet TAKE 1 TABLET BY MOUTH DAILY Patient taking differently: Take 40 mg by mouth daily as needed (hands and ankle swelling).  12/28/18  Yes Biagio Borg, MD  losartan (COZAAR) 100 MG tablet Take 1 tablet (100 mg total) by mouth daily. 05/23/19  Yes Biagio Borg, MD  [facility] 4 MG/0.1ML LIQD nasal spray kit See admin instructions. As directed. 04/25/19  Yes [provider]  ondansetron (ZOFRAN) 4 MG tablet TAKE 1 TABLET BY MOUTH EVERY 8 HOURS AS NEEDED FOR NAUSEA OR VOMITING Patient taking differently: Take 4 mg by mouth every 8 (eight) hours as needed for nausea or vomiting.  04/28/18  Yes Biagio Borg, MD  Oxycodone HCl 10 MG TABS Take 1 tablet (10 mg total) 4 (four) times daily by mouth. Per [facility] Patient taking differently: Take 10 mg by mouth 5 (five) times daily. Per [facility] 11/08/17  Yes Biagio Borg, MD  pantoprazole (PROTONIX) 40 MG tablet Take 1 tablet (40 mg total) by mouth daily. 07/26/19  Yes Biagio Borg, MD  sertraline (ZOLOFT) 100 MG tablet Take 1.5 tablets (150 mg total) by mouth daily. Patient taking differently: Take 100 mg by mouth daily.  07/20/18  Yes Biagio Borg, MD  Tetrahydrozoline HCl (VISINE OP) Place 1 drop into  both eyes daily.   Yes [provider]  triamcinolone (NASACORT) 55 MCG/ACT AERO nasal inhaler Place 2 sprays into the nose daily. Patient taking differently: Place 2 sprays into the nose daily as needed (sneezing/runny nose).  06/13/18  Yes Biagio Borg, MD  triamcinolone cream (KENALOG) 0.1 % Apply 1 application 2 (two) times daily as needed topically (rash). Patient taking differently: Apply 1 application topically every evening. For dry skin on legs 11/08/17  Yes Biagio Borg, MD  cephALEXin (KEFLEX) 500 MG capsule Take 2 capsules (1,000 mg total) by mouth 2 (two) times daily. 08/23/19   Charlesetta Shanks, MD  dicyclomine (BENTYL) 10 MG capsule Take 1 capsule (10 mg total) 4 (four) times daily -  before meals and at bedtime by mouth. Patient not taking: Reported on 08/23/2019 11/08/17   Biagio Borg, MD  fluconazole (DIFLUCAN) 150 MG tablet Take 1 tablet every third day.  08/23/19   Charlesetta Shanks, MD  ondansetron (ZOFRAN ODT) 4 MG disintegrating tablet Take 1 tablet (4 mg total) by mouth every 4 (four) hours as needed for nausea or vomiting. 08/23/19   Charlesetta Shanks, MD  potassium chloride (K-DUR) 10 MEQ tablet TAKE 1 TABLET BY MOUTH DAILY Patient not taking: No sig reported 12/28/18   Biagio Borg, MD  rosuvastatin (CRESTOR) 10 MG tablet Take 1 tablet (10 mg total) by mouth daily. Patient not taking: Reported on 08/23/2019 01/26/19   Biagio Borg, MD  DULoxetine (CYMBALTA) 60 MG capsule Take 60 mg by mouth daily.    03/09/12  [provider]  fexofenadine (ALLEGRA) 180 MG tablet Take 180 mg by mouth daily.    03/09/12  [provider]    Family History Family History  Problem Relation Age of Onset  . Heart disease Mother   . Hypertension Mother   . Diabetes Mother   . Heart failure Mother   . Asthma Sister   . Hypertension Father   . Asthma Daughter   . Cancer Maternal Uncle        [place]  . Cancer Other        ovarian  . Liver cancer Paternal Grandmother         ????    Social History Social History   Tobacco Use  . Smoking status: Former Smoker    Years: 30.00    Quit date: 11/08/2008    Years since quitting: 10.7  . Smokeless tobacco: Never Used  . Tobacco comment: quit 10/09  Substance Use Topics  . Alcohol use: No    Comment: quit drinking 10/07/1997  . Drug use: No     Allergies   Amoxicillin-pot clavulanate, Adhesive [tape], Codeine, Lactose intolerance (gi), Morphine and related, Vicodin [hydrocodone-acetaminophen], and Latex   Review of Systems Review of Systems 10 Systems reviewed and are negative for acute change except as noted in the HPI.   Physical Exam Updated Vital Signs BP (!) 104/53   Pulse 76   Temp 98.5 F (36.9 C) (Oral)   Resp 16   Ht '5\' 5"'  (1.651 m)   Wt (!) 158.8 kg   SpO2 98%   BMI 58.24 kg/m   Physical Exam Constitutional:      Comments: Alert and nontoxic.  No respiratory distress.  Clear mental status.  HENT:     Head: Normocephalic and atraumatic.  Eyes:     Extraocular Movements: Extraocular movements intact.     Conjunctiva/sclera: Conjunctivae normal.  Cardiovascular:     Rate and Rhythm: Normal rate and regular rhythm.  Pulmonary:     Effort: Pulmonary effort is normal.     Breath sounds: Normal breath sounds.  Abdominal:     Comments: Moderate epigastric pain to palpation.  Bilateral lower quadrants are tender with left greater than right.  Patient has moderate candidal rash bilateral groin folds.  Musculoskeletal: Normal range of motion.     Right lower leg: No edema.     Left lower leg: No edema.  Skin:    General: Skin is warm and dry.  Neurological:     General: No focal deficit present.     Mental Status: She is oriented to person, place, and time.     Cranial Nerves: No cranial nerve deficit.     Coordination: Coordination normal.  Psychiatric:        Mood and Affect: Mood normal.      ED Treatments / Results  Labs (all labs ordered are listed, but only abnormal  results are displayed) Labs Reviewed  COMPREHENSIVE METABOLIC PANEL - Abnormal; Notable for the following components:      Result Value   Glucose, Bld 153 (*)    Calcium 8.5 (*)    All other components within normal limits  URINALYSIS, ROUTINE W REFLEX MICROSCOPIC - Abnormal; Notable for the following components:   APPearance HAZY (*)    Leukocytes,Ua MODERATE (*)    Bacteria, UA RARE (*)    Non Squamous Epithelial 0-5 (*)    All other components within normal limits  URINE CULTURE  LIPASE, BLOOD  CBC WITH DIFFERENTIAL/PLATELET  LACTIC ACID, PLASMA  PROTIME-INR  LACTIC ACID, PLASMA    EKG None  Radiology Ct Abdomen Pelvis W Contrast  Result Date: 08/23/2019 CLINICAL DATA:  Abdominal pain EXAM: CT ABDOMEN AND PELVIS WITH CONTRAST TECHNIQUE: Multidetector CT imaging of the abdomen and pelvis was performed using the standard protocol following bolus administration of intravenous contrast. CONTRAST:  11m OMNIPAQUE IOHEXOL 300 MG/ML  SOLN COMPARISON:  CT 05/14/2018 FINDINGS: Lower chest: Lung bases demonstrate no acute consolidation or effusion. The heart size is upper limits of normal Hepatobiliary: No focal liver abnormality is seen. No gallstones, gallbladder wall thickening, or biliary dilatation. Pancreas: Unremarkable. No pancreatic ductal dilatation or surrounding inflammatory changes. Spleen: Normal in size without focal abnormality. Adrenals/Urinary Tract: Adrenal glands are unremarkable. Kidneys are normal, without renal calculi, focal lesion, or hydronephrosis. Bladder is unremarkable. Stomach/Bowel: Stomach is within normal limits. Fluid-filled small bowel without significant dilatation or bowel wall thickening. Negative appendix. Sigmoid [place] diverticula without acute inflammatory change Vascular/Lymphatic: Moderate aortic atherosclerosis. No aneurysm. No significantly enlarged lymph nodes Reproductive: Status post hysterectomy. No adnexal masses. Other: No free air. Small free  fluid in the pelvis. Tiny amount of perisplenic fluid. Small fluid in the left lower quadrant and interspersed among bowel loops in the pelvis. Mild hazy infiltration of the mesenteric root as before. Fat containing umbilical Musculoskeletal: No acute or suspicious osseous abnormality IMPRESSION: 1. Small amount of perisplenic free fluid and small amount of free fluid within the pelvis of uncertain etiology. No free air identified. 2. Otherwise no CT evidence for acute intra-abdominal or pelvic abnormality. Electronically Signed   By: KDonavan FoilM.D.   On: 08/23/2019 19:21    Procedures Procedures (including critical care time)  Medications Ordered in ED Medications  iohexol (OMNIPAQUE) 300 MG/ML solution 100 mL (100 mLs Intravenous Contrast Given 08/23/19 1843)  sodium chloride (PF) 0.9 % injection (  Given by Other 08/23/19 1909)  cefTRIAXone (ROCEPHIN) 1 g in sodium chloride 0.9 % 100 mL IVPB (0 g Intravenous Stopped 08/23/19 2006)  ondansetron (ZOFRAN) injection 4 mg (4 mg Intravenous Given 08/23/19 2005)  HYDROmorphone (DILAUDID) injection 1 mg (1 mg Intravenous Given 08/23/19 2006)     Initial Impression / Assessment and Plan / ED Course  I have reviewed the triage vital signs and the nursing notes.  Pertinent labs & imaging results that were available during my care of the patient were reviewed by me and considered in my medical decision making (see chart for details).       Urinalysis is positive.  Will treat for UTI.  First dose of Rocephin given in the emergency department.  CT does not identify any significant constipation or obstruction or colitis.  Small amount of nonspecific fluid.  Labs do not suggest acute hepatic dysfunction.  Abdominal pain is predominantly lower in nature.  Suspect pain may  be due to UTI.  Patient is clinically well and nontoxic.  At this time I feel she is stable for outpatient treatment.  Patient has PCP for close follow-up.  Return precautions reviewed.  We  will also treat for moderate Candida rash.  Final Clinical Impressions(s) / ED Diagnoses   Final diagnoses:  Generalized abdominal pain  Constipation, unspecified constipation type  Urinary tract infection without hematuria, site unspecified  Candida rash of groin    ED Discharge Orders         Ordered    cephALEXin (KEFLEX) 500 MG capsule  2 times daily     08/23/19 2056    ondansetron (ZOFRAN ODT) 4 MG disintegrating tablet  Every 4 hours PRN     08/23/19 2056    fluconazole (DIFLUCAN) 150 MG tablet     08/23/19 2059           Charlesetta Shanks, MD 08/23/19 2102

## 2019-08-23 NOTE — Discharge Instructions (Signed)
1.  Follow-up with your doctor within the next 3 days.  Have your doctor review your CT scan results and other lab results. 2.  Return the emergency department for developing fever or worsening pain or other concerning symptoms.

## 2019-08-23 NOTE — ED Triage Notes (Signed)
Pt presents with c/o constipation for the last 9 days. Pt c/o abdominal pain and increased in her acid reflux as well.

## 2019-08-25 ENCOUNTER — Other Ambulatory Visit: Payer: Self-pay | Admitting: Internal Medicine

## 2019-08-25 ENCOUNTER — Other Ambulatory Visit: Payer: Self-pay

## 2019-08-25 LAB — URINE CULTURE: Culture: <10000 — AB

## 2019-08-25 NOTE — Patient Outreach (Signed)
[facility]) Care Management  08/25/2019  Maria [name] Jan 30, 1956 [id number]   Medication Adherence call to [name] [place] Identifiers Verify spoke with patient she is past due on Rosuvastatin 10 mg patient explain she is no longer taking this medication she was having side effects ( leg cramps) doctor stop the medication.[name] is showing past due under [place].   [place] Management Direct Dial [phone]  Fax [phone] [email or web address] [email or web address]

## 2019-08-30 ENCOUNTER — Ambulatory Visit (INDEPENDENT_AMBULATORY_CARE_PROVIDER_SITE_OTHER): Payer: Medicare Other | Admitting: Internal Medicine

## 2019-08-30 DIAGNOSIS — R110 Nausea: Secondary | ICD-10-CM

## 2019-08-30 DIAGNOSIS — K654 Sclerosing mesenteritis: Secondary | ICD-10-CM

## 2019-08-30 DIAGNOSIS — R399 Unspecified symptoms and signs involving the genitourinary system: Secondary | ICD-10-CM | POA: Diagnosis not present

## 2019-08-30 NOTE — Progress Notes (Signed)
Patient ID: Maria [name], female   DOB: 1956/09/24, 63 y.o.   MRN: [id number]  Virtual Visit via Video Note  I connected with Maria [name] on 08/31/19 at  1:00 PM EDT by a video enabled telemedicine application and verified that I am speaking with the correct person using two identifiers.  Location: Patient: at home Provider: at office   I discussed the limitations of evaluation and management by telemedicine and the availability of in person appointments. The patient expressed understanding and agreed to proceed.  History of Present Illness: Here o f/u with c/o nausea (no vomit) with lower appetite, maybe a few lbs wt loss but no fever, chills.  Has only had BM x 1 in 9 days, but not eating that much as well.  Also ha d mild urinary symptoms with dysuria, frequency, urgency, but no flank pain, hematuria.  Pt denies chest pain, increased sob or doe, wheezing, orthopnea, PND, increased LE swelling, palpitations, dizziness or syncope.   Pt denies polydipsia, polyuria   Past Medical History:  Diagnosis Date  . Acute lymphadenitis 2011  . ALLERGIC RHINITIS 08/10/2007  . ANXIETY 12/06/2007  . Atherosclerotic peripheral vascular disease ([place]) 06/13/2013   Aorta on CT June 2014  . BACK PAIN 12/06/2007  . Cellulitis 2011  . Cervical disc disease 03/09/2012  . Chronic pain 03/09/2012  . DEPRESSION 12/06/2007  . DIVERTICULOSIS, [place] 12/06/2007  . GERD 12/06/2007  . Hepatitis    age 13 hepatitis A  . HLD (hyperlipidemia) 05/24/2019  . HYPERTENSION 12/06/2007  . Impaired glucose tolerance 03/13/2012  . LOW BACK PAIN 12/06/2007  . Lumbar disc disease 03/09/2012  . Mesenteric adenitis   . MRSA 2006  . Pain    by breast area  . Pain in joint, multiple sites 04/21/2010  . PARONYCHIA, FINGER 08/13/2009  . Pneumonia sept 2015   walking pneumonia, sepsis   . RASH-NONVESICULAR 05/09/2008  . 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 SHOULDER PAIN, LEFT 05/09/2008  . SINUSITIS- ACUTE-NOS 05/09/2008   . THORACIC/LUMBOSACRAL NEURITIS/RADICULITIS UNSPEC 12/28/2008   Past Surgical History:  Procedure Laterality Date  . ABDOMINAL HYSTERECTOMY  1999   1 ovary left  . bloo clot removed from neck   may 25th , june 2. 2010  . [place] SURGERY  may 24th 2010  . COLONOSCOPY WITH PROPOFOL N/A 07/10/2016   Procedure: COLONOSCOPY WITH PROPOFOL;  Surgeon: Manus Gunning, MD;  Location: [facility];  Service: Gastroenterology;  Laterality: N/A;  . ESOPHAGOGASTRODUODENOSCOPY (EGD) WITH PROPOFOL N/A 07/10/2016   Procedure: ESOPHAGOGASTRODUODENOSCOPY (EGD) WITH PROPOFOL;  Surgeon: Manus Gunning, MD;  Location: [facility];  Service: Gastroenterology;  Laterality: N/A;  . s/p ovary cyst    . s/p right knee arthroscopy     Dr. Mardelle Matte ortho    reports that she quit smoking about 10 years ago. She quit after 30.00 years of use. She has never used smokeless tobacco. She reports that she does not drink alcohol or use drugs. family history includes Asthma in her daughter and sister; Cancer in her maternal uncle and another family member; Diabetes in her mother; Heart disease in her mother; Heart failure in her mother; Hypertension in her father and mother; Liver cancer in her paternal grandmother. Allergies  Allergen Reactions  . Amoxicillin-Pot Clavulanate Nausea And Vomiting    Projectile vomiting Has patient had a PCN reaction causing immediate rash, facial/tongue/throat swelling, SOB or lightheadedness with hypotension: no Has patient had a PCN reaction causing severe rash involving mucus membranes  or skin necrosis: No Has patient had a PCN reaction that required hospitalization: No Has patient had a PCN reaction occurring within the last 10 years: 49-31 yrs old If all of the above answers are "NO", then may proceed with Cephalosporin use.   . Adhesive [Tape] Other (See Comments)    Tears skin off - use paper tape   . Codeine Other (See Comments)    hallucinations  . Lactose  Intolerance (Gi) Nausea And Vomiting  . Morphine And Related Nausea And Vomiting and Other (See Comments)    Migraine headaches  . Vicodin [Hydrocodone-Acetaminophen] Other (See Comments)    hallucinations  . Latex Rash   Current Outpatient Medications on File Prior to Visit  Medication Sig Dispense Refill  . acetaminophen (TYLENOL) 325 MG tablet Take 650 mg by mouth every 6 (six) hours as needed for headache (pain).    . cephALEXin (KEFLEX) 500 MG capsule Take 2 capsules (1,000 mg total) by mouth 2 (two) times daily. 28 capsule 0  . clonazePAM (KLONOPIN) 1 MG tablet TAKE 1 TABLET BY MOUTH 2 TIMES DAILY AS NEEDED FOR ANXIETY 60 tablet 5  . cyclobenzaprine (FLEXERIL) 10 MG tablet TAKE 1 TABLET BY MOUTH 2 TIMES A DAY AS NEEDED FOR MUSCLE SPASMS 180 tablet 1  . dicyclomine (BENTYL) 10 MG capsule Take 1 capsule (10 mg total) 4 (four) times daily -  before meals and at bedtime by mouth. (Patient not taking: Reported on 08/23/2019) 120 capsule 2  . fluconazole (DIFLUCAN) 150 MG tablet Take 1 tablet every third day. 3 tablet 0  . furosemide (LASIX) 40 MG tablet TAKE 1 TABLET BY MOUTH DAILY (Patient taking differently: Take 40 mg by mouth daily as needed (hands and ankle swelling). ) 90 tablet 0  . losartan (COZAAR) 100 MG tablet Take 1 tablet (100 mg total) by mouth daily. 90 tablet 3  . NARCAN 4 MG/0.1ML LIQD nasal spray kit See admin instructions. As directed.    . ondansetron (ZOFRAN ODT) 4 MG disintegrating tablet Take 1 tablet (4 mg total) by mouth every 4 (four) hours as needed for nausea or vomiting. 20 tablet 0  . ondansetron (ZOFRAN) 4 MG tablet TAKE 1 TABLET BY MOUTH EVERY 8 HOURS AS NEEDED FOR NAUSEA OR VOMITING (Patient taking differently: Take 4 mg by mouth every 8 (eight) hours as needed for nausea or vomiting. ) 40 tablet 0  . Oxycodone HCl 10 MG TABS Take 1 tablet (10 mg total) 4 (four) times daily by mouth. Per [facility] (Patient taking differently: Take 10 mg by mouth 5 (five)  times daily. Per [facility]) 30 tablet 0  . pantoprazole (PROTONIX) 40 MG tablet Take 1 tablet (40 mg total) by mouth daily. 90 tablet 3  . potassium chloride (K-DUR) 10 MEQ tablet TAKE 1 TABLET BY MOUTH DAILY (Patient not taking: No sig reported) 90 tablet 0  . rosuvastatin (CRESTOR) 10 MG tablet Take 1 tablet (10 mg total) by mouth daily. (Patient not taking: Reported on 08/23/2019) 90 tablet 3  . sertraline (ZOLOFT) 100 MG tablet TAKE 1 & ONE-HALF TABLET BY MOUTH EVERY DAY 135 tablet 0  . Tetrahydrozoline HCl (VISINE OP) Place 1 drop into both eyes daily.    Marland Kitchen triamcinolone (NASACORT) 55 MCG/ACT AERO nasal inhaler Place 2 sprays into the nose daily. (Patient taking differently: Place 2 sprays into the nose daily as needed (sneezing/runny nose). ) 1 Inhaler 12  . triamcinolone cream (KENALOG) 0.1 % Apply 1 application 2 (two)  times daily as needed topically (rash). (Patient taking differently: Apply 1 application topically every evening. For dry skin on legs) 30 g 11  . [DISCONTINUED] DULoxetine (CYMBALTA) 60 MG capsule Take 60 mg by mouth daily.      . [DISCONTINUED] fexofenadine (ALLEGRA) 180 MG tablet Take 180 mg by mouth daily.       No current facility-administered medications on file prior to visit.    Observations/Objective: Alert, NAD, appropriate mood and affect, resps normal, cn 2-12 intact, moves all 4s, no visible rash or swelling Lab Results  Component Value Date   WBC 9.6 08/23/2019   HGB 13.1 08/23/2019   HCT 41.7 08/23/2019   PLT 219 08/23/2019   GLUCOSE 153 (H) 08/23/2019   CHOL 199 01/25/2019   TRIG 278.0 (H) 01/25/2019   HDL 31.70 (L) 01/25/2019   LDLDIRECT 127.0 01/25/2019   LDLCALC 104 (H) 11/08/2017   ALT 20 08/23/2019   AST 22 08/23/2019   NA 137 08/23/2019   K 4.2 08/23/2019   CL 101 08/23/2019   CREATININE 1.00 08/23/2019   BUN 16 08/23/2019   CO2 28 08/23/2019   TSH 2.58 01/25/2019   INR 1.1 08/23/2019   HGBA1C 5.7 01/25/2019   Assessment and  Plan: See notes  Follow Up Instructions: See notes   I discussed the assessment and treatment plan with the patient. The patient was provided an opportunity to ask questions and all were answered. The patient agreed with the plan and demonstrated an understanding of the instructions.   The patient was advised to call back or seek an in-person evaluation if the symptoms worsen or if the condition fails to improve as anticipated.   Cathlean Cower, MD

## 2019-08-31 ENCOUNTER — Encounter: Payer: Self-pay | Admitting: Internal Medicine

## 2019-08-31 DIAGNOSIS — R110 Nausea: Secondary | ICD-10-CM | POA: Insufficient documentation

## 2019-08-31 NOTE — Assessment & Plan Note (Signed)
F/u GI as planned

## 2019-08-31 NOTE — Patient Instructions (Signed)
Please take all new medication as prescribed - the antibiotic, and nausea medicaiton if needed  Please continue all other medications as before, and refills have been done if requested.  Please have the pharmacy call with any other refills you may need.  Please continue your efforts at being more active, low cholesterol diet, and weight control.  Please keep your appointments with your specialists as you may have planned

## 2019-08-31 NOTE — Assessment & Plan Note (Signed)
For zofran prn,  to f/u any worsening symptoms or concerns 

## 2019-08-31 NOTE — Assessment & Plan Note (Signed)
Likely uti, for urine studies, cephalexin asd,  to f/u any worsening symptoms or concerns

## 2019-09-12 ENCOUNTER — Ambulatory Visit (INDEPENDENT_AMBULATORY_CARE_PROVIDER_SITE_OTHER): Payer: Medicare Other | Admitting: Gastroenterology

## 2019-09-12 ENCOUNTER — Encounter: Payer: Self-pay | Admitting: Gastroenterology

## 2019-09-12 VITALS — BP 130/76 | HR 80 | Temp 98.0°F | Ht 65.0 in | Wt 361.0 lb

## 2019-09-12 DIAGNOSIS — Z1211 Encounter for screening for malignant neoplasm of colon: Secondary | ICD-10-CM

## 2019-09-12 DIAGNOSIS — R110 Nausea: Secondary | ICD-10-CM

## 2019-09-12 DIAGNOSIS — K5900 Constipation, unspecified: Secondary | ICD-10-CM

## 2019-09-12 DIAGNOSIS — R6881 Early satiety: Secondary | ICD-10-CM | POA: Diagnosis not present

## 2019-09-12 DIAGNOSIS — R109 Unspecified abdominal pain: Secondary | ICD-10-CM | POA: Diagnosis not present

## 2019-09-12 MED ORDER — METOCLOPRAMIDE HCL 5 MG PO TABS: 5.0000 mg | ORAL_TABLET | Freq: Three times a day (TID) | ORAL | 1 refills | Status: DC

## 2019-09-12 MED ORDER — POLYETHYLENE GLYCOL 3350 17 G PO PACK: PACK | ORAL | 0 refills | Status: DC

## 2019-09-12 MED ORDER — SUPREP BOWEL PREP KIT 17.5-3.13-1.6 GM/177ML PO SOLN: ORAL | 0 refills | Status: DC

## 2019-09-12 NOTE — Patient Instructions (Addendum)
If you are age 62 or older, your body mass index should be between 23-30. Your Body mass index is 60.07 kg/m. If this is out of the aforementioned range listed, please consider follow up with your Primary Care Provider.  If you are age 73 or younger, your body mass index should be between 19-25. Your Body mass index is 60.07 kg/m. If this is out of the aformentioned range listed, please consider follow up with your Primary Care Provider.   You have been scheduled for a colonoscopy. Please follow written instructions given to you at your visit today.  Please pick up your prep supplies at the pharmacy within the next 1-3 days. If you use inhalers (even only as needed), please bring them with you on the day of your procedure. Your physician has requested that you go to www.startemmi.com and enter the access code given to you at your visit today. This web site gives a general overview about your procedure. However, you should still follow specific instructions given to you by our office regarding your preparation for the procedure.  Due to recent COVID-19 restrictions implemented by our local and state authorities and in an effort to keep both patients and staff as safe as possible, our hospital system now requires COVID-19 testing prior to any scheduled hospital procedure. Please go to our [facility] location drive thru testing site ([address], [place], [place] [address]) on Friday, 09-22-19 at  1:30pm. There will be multiple testing areas, the first checkpoint being for pre-procedure/surgery testing. Get into the right (yellow) lane that leads to the PAT testing team. You will not be billed at the time of testing but may receive a bill later depending on your insurance. The approximate cost of the test is $100. You must agree to quarantine from the time of your testing until the procedure date on 09-26-19 . This should include staying at home with ONLY the people you live with. Avoid take-out,  grocery store shopping or leaving the house for any non-emergent reason. Failure to have your COVID-19 test done on the date and time you have been scheduled will result in cancellation of procedure. Please call our office at [phone] if you have any questions.    We have sent the following medications to your pharmacy for you to pick up at your convenience: Reglan 5 mg: Three times a day with meals  Take Miralax, over-the-counter, once to twice daily. We are giving you a coupon today.  We placed a referral for you as discussed to [facility]. It usually takes about 1-2 weeks to process and schedule this referral. If you have not heard from them regarding this appointment in 2 weeks please contact our office.  Thank you for entrusting me with your care and for choosing [facility], [address] [place]

## 2019-09-12 NOTE — H&P (View-Only) (Signed)
HPI :  63 year old female here for a follow-up visit.  She is known to our office for prior EGD and colonoscopy, as well as for sclerosing mesenteritis.  She last saw [place] in 2017, she had an EGD and colonoscopy at that time.  Her EGD was unremarkable.  Her colonoscopy was remarkable for 6 polyps, most adenomas, and it was recommended that she have a 3-year follow-up colonoscopy.  She reports having chronic constipation.  She had an episode that was severe and led her to the ER when she did not have a bowel movement for 7 days.  She takes chronic narcotics for joint pains and back pain, she takes roughly 5 oxycodone per day.  She thinks her constipation is actually relatively recent.  She previously had a bowel movement every day, now goes every other day to every few days.  Taking a stool softener which is not really helping, not using much of anything else.  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  She was seen by rheumatology, Dr. Amil Amen, she states she was given a trial of steroids and she gained a significant amount of weight on that.  Her abdomen continues to bother her, she had a relatively recent CT scan however which showed no evidence of persistent sclerosing mesenteritis.  Her abdominal discomfort is in her upper abdomen.  She states it comes and goes, tends to feel worse with eating.  She has been having some early satiety and some nausea.  She denies any vomiting.  Eating seems to produce her symptoms.  She does find relief with Zofran.  She has bloating and discomfort in her upper to mid abdomen.  Having a bowel movement makes her feel better.  She has chronic back and shoulder pain which is why she refuses the amount of narcotics she does.  She is taking Protonix 40 mg once a day and seems to work okay for reflux symptoms.  She denies a history of diabetes  Her body mass index is over 60.  Her last colonoscopy was done at the hospital.  She  has been having a difficult time losing weight, is very frustrated by her lack of progress with this.  Asking for assistance.   CT scan 08/23/19 - IMPRESSION: 1. Small amount of perisplenic free fluid and small amount of free fluid within the pelvis of uncertain etiology. No free air identified. 2. Otherwise no CT evidence for acute intra-abdominal or pelvic abnormality.  Colonoscopy 07/10/16 - 6 polyps removed, 1 cm cecal AVM, hemorrhoids, diverticulosis - path shows most polyps adenomatous - repeat in 3 years EGD 07/10/16 - normal exam, biopsies obtained, negative for H pylori  Past Medical History:  Diagnosis Date  . Acute lymphadenitis 2011  . ALLERGIC RHINITIS 08/10/2007  . ANXIETY 12/06/2007  . Atherosclerotic peripheral vascular disease ([place]) 06/13/2013   Aorta on CT June 2014  . BACK PAIN 12/06/2007  . Cellulitis 2011  . Cervical disc disease 03/09/2012  . Chronic pain 03/09/2012  . DEPRESSION 12/06/2007  . DIVERTICULOSIS, [place] 12/06/2007  . GERD 12/06/2007  . Hepatitis    age 53 hepatitis A  . HLD (hyperlipidemia) 05/24/2019  . HYPERTENSION 12/06/2007  . Impaired glucose tolerance 03/13/2012  . LOW BACK PAIN 12/06/2007  . Lumbar disc disease 03/09/2012  . Mesenteric adenitis   . MRSA 2006  . Pain    by breast area  . Pain in joint, multiple sites 04/21/2010  . PARONYCHIA, FINGER 08/13/2009  .  Pneumonia sept 2015   walking pneumonia, sepsis   . RASH-NONVESICULAR 05/09/2008  . 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[place]) 11/08/2017  . SHOULDER PAIN, LEFT 05/09/2008  . SINUSITIS- ACUTE-NOS 05/09/2008  . THORACIC/LUMBOSACRAL NEURITIS/RADICULITIS UNSPEC 12/28/2008     Past Surgical History:  Procedure Laterality Date  . ABDOMINAL HYSTERECTOMY  1999   1 ovary left  . bloo clot removed from neck   may 25th , june 2. 2010  . [place] SURGERY  may 24th 2010  . COLONOSCOPY WITH PROPOFOL N/A 07/10/2016   Procedure: COLONOSCOPY WITH PROPOFOL;  Surgeon: Manus Gunning, MD;  Location: [facility]  ENDOSCOPY;  Service: Gastroenterology;  Laterality: N/A;  . ESOPHAGOGASTRODUODENOSCOPY (EGD) WITH PROPOFOL N/A 07/10/2016   Procedure: ESOPHAGOGASTRODUODENOSCOPY (EGD) WITH PROPOFOL;  Surgeon: Manus Gunning, MD;  Location: [facility] ENDOSCOPY;  Service: Gastroenterology;  Laterality: N/A;  . s/p ovary cyst    . s/p right knee arthroscopy     Dr. Mardelle Matte ortho   Family History  Problem Relation Age of Onset  . Heart disease Mother   . Hypertension Mother   . Diabetes Mother   . Heart failure Mother   . Asthma Sister   . Hypertension Father   . Asthma Daughter   . Cancer Maternal Uncle        Larose  . Cancer Other        ovarian  . Liver cancer Paternal Grandmother        ????   Social History   Tobacco Use  . Smoking status: Former Smoker    Years: 30.00    Quit date: 11/08/2008    Years since quitting: 10.8  . Smokeless tobacco: Never Used  . Tobacco comment: quit 10/09  Substance Use Topics  . Alcohol use: No    Comment: quit drinking 10/07/1997  . Drug use: No   Current Outpatient Medications  Medication Sig Dispense Refill  . acetaminophen (TYLENOL) 325 MG tablet Take 650 mg by mouth every 6 (six) hours as needed for headache (pain).    . clonazePAM (KLONOPIN) 1 MG tablet TAKE 1 TABLET BY MOUTH 2 TIMES DAILY AS NEEDED FOR ANXIETY 60 tablet 5  . cyclobenzaprine (FLEXERIL) 10 MG tablet TAKE 1 TABLET BY MOUTH 2 TIMES A DAY AS NEEDED FOR MUSCLE SPASMS 180 tablet 1  . furosemide (LASIX) 40 MG tablet TAKE 1 TABLET BY MOUTH DAILY (Patient taking differently: Take 40 mg by mouth daily as needed (hands and ankle swelling). ) 90 tablet 0  . losartan (COZAAR) 100 MG tablet Take 1 tablet (100 mg total) by mouth daily. 90 tablet 3  . NARCAN 4 MG/0.1ML LIQD nasal spray kit See admin instructions. As directed.    . ondansetron (ZOFRAN ODT) 4 MG disintegrating tablet Take 1 tablet (4 mg total) by mouth every 4 (four) hours as needed for nausea or vomiting. 20 tablet 0  . ondansetron  (ZOFRAN) 4 MG tablet TAKE 1 TABLET BY MOUTH EVERY 8 HOURS AS NEEDED FOR NAUSEA OR VOMITING (Patient taking differently: Take 4 mg by mouth every 8 (eight) hours as needed for nausea or vomiting. ) 40 tablet 0  . Oxycodone HCl 10 MG TABS Take 1 tablet (10 mg total) 4 (four) times daily by mouth. Per [facility] (Patient taking differently: Take 10 mg by mouth 5 (five) times daily. Per [facility]) 30 tablet 0  . pantoprazole (PROTONIX) 40 MG tablet Take 1 tablet (40 mg total) by mouth daily. 90 tablet 3  . potassium chloride (K-DUR)  10 MEQ tablet TAKE 1 TABLET BY MOUTH DAILY 90 tablet 0  . rosuvastatin (CRESTOR) 10 MG tablet Take 1 tablet (10 mg total) by mouth daily. 90 tablet 3  . sertraline (ZOLOFT) 100 MG tablet TAKE 1 & ONE-HALF TABLET BY MOUTH EVERY DAY 135 tablet 0  . Tetrahydrozoline HCl (VISINE OP) Place 1 drop into both eyes daily.    Marland Kitchen triamcinolone (NASACORT) 55 MCG/ACT AERO nasal inhaler Place 2 sprays into the nose daily. (Patient taking differently: Place 2 sprays into the nose daily as needed (sneezing/runny nose). ) 1 Inhaler 12   No current facility-administered medications for this visit.    Allergies  Allergen Reactions  . Amoxicillin-Pot Clavulanate Nausea And Vomiting    Projectile vomiting Has patient had a PCN reaction causing immediate rash, facial/tongue/throat swelling, SOB or lightheadedness with hypotension: no Has patient had a PCN reaction causing severe rash involving mucus membranes or skin necrosis: No Has patient had a PCN reaction that required hospitalization: No Has patient had a PCN reaction occurring within the last 10 years: 1-30 yrs old If all of the above answers are "NO", then may proceed with Cephalosporin use.   . Adhesive [Tape] Other (See Comments)    Tears skin off - use paper tape   . Codeine Other (See Comments)    hallucinations  . Lactose Intolerance (Gi) Nausea And Vomiting  . Morphine And Related Nausea And Vomiting  and Other (See Comments)    Migraine headaches  . Vicodin [Hydrocodone-Acetaminophen] Other (See Comments)    hallucinations  . Latex Rash     Review of Systems: All systems reviewed and negative except where noted in HPI.    Ct Abdomen Pelvis W Contrast  Result Date: 08/23/2019 CLINICAL DATA:  Abdominal pain EXAM: CT ABDOMEN AND PELVIS WITH CONTRAST TECHNIQUE: Multidetector CT imaging of the abdomen and pelvis was performed using the standard protocol following bolus administration of intravenous contrast. CONTRAST:  115m OMNIPAQUE IOHEXOL 300 MG/ML  SOLN COMPARISON:  CT 05/14/2018 FINDINGS: Lower chest: Lung bases demonstrate no acute consolidation or effusion. The heart size is upper limits of normal Hepatobiliary: No focal liver abnormality is seen. No gallstones, gallbladder wall thickening, or biliary dilatation. Pancreas: Unremarkable. No pancreatic ductal dilatation or surrounding inflammatory changes. Spleen: Normal in size without focal abnormality. Adrenals/Urinary Tract: Adrenal glands are unremarkable. Kidneys are normal, without renal calculi, focal lesion, or hydronephrosis. Bladder is unremarkable. Stomach/Bowel: Stomach is within normal limits. Fluid-filled small bowel without significant dilatation or bowel wall thickening. Negative appendix. Sigmoid [place] diverticula without acute inflammatory change Vascular/Lymphatic: Moderate aortic atherosclerosis. No aneurysm. No significantly enlarged lymph nodes Reproductive: Status post hysterectomy. No adnexal masses. Other: No free air. Small free fluid in the pelvis. Tiny amount of perisplenic fluid. Small fluid in the left lower quadrant and interspersed among bowel loops in the pelvis. Mild hazy infiltration of the mesenteric root as before. Fat containing umbilical Musculoskeletal: No acute or suspicious osseous abnormality IMPRESSION: 1. Small amount of perisplenic free fluid and small amount of free fluid within the pelvis of  uncertain etiology. No free air identified. 2. Otherwise no CT evidence for acute intra-abdominal or pelvic abnormality. Electronically Signed   By: KDonavan FoilM.D.   On: 08/23/2019 19:21   Lab Results  Component Value Date   WBC 9.6 08/23/2019   HGB 13.1 08/23/2019   HCT 41.7 08/23/2019   MCV 93.1 08/23/2019   PLT 219 08/23/2019    Lab Results  Component Value Date  CREATININE 1.00 08/23/2019   BUN 16 08/23/2019   NA 137 08/23/2019   K 4.2 08/23/2019   CL 101 08/23/2019   CO2 28 08/23/2019    Lab Results  Component Value Date   ALT 20 08/23/2019   AST 22 08/23/2019   ALKPHOS 68 08/23/2019   BILITOT 0.7 08/23/2019     Physical Exam: BP 130/76   Pulse 80   Temp 98 F (36.7 C) (Oral)   Ht '5\' 5"'  (1.651 m)   Wt (!) 361 lb (163.7 kg)   BMI 60.07 kg/m  Constitutional: Pleasant,well-developed, female in no acute distress. HEENT: Normocephalic and atraumatic. Conjunctivae are normal. No scleral icterus. Neck supple.  Cardiovascular: Normal rate, regular rhythm.  Pulmonary/chest: Effort normal and breath sounds normal. No wheezing, rales or rhonchi. Abdominal: Soft, nondistended, protuberant, nontender. There are no masses palpable. No hepatomegaly. Extremities: no edema Lymphadenopathy: No cervical adenopathy noted. Neurological: Alert and oriented to person place and time. Skin: Skin is warm and dry. No rashes noted. Psychiatric: Normal mood and affect. Behavior is normal.   ASSESSMENT AND PLAN: 63 y/o female here for reassessment of the following issues:  Constipation - ongoing constipation in the setting of chronic narcotic use.  She has been feeling poorly relatively recently with this, recommend she stop the stool softeners and try MiraLAX a few times a day.  She can titrate that up as needed.  If this does not provide her benefit, I asked her to contact me and we will try another option.  She agreed  Early satiety / nausea / Abdominal discomfort - chronic  abdominal discomfort, recent CT scan does not show any persistence of history of sclerosing mesenteriitis, this was previously treated by Rheumatology but don't see any evidence of this on the most recent CT.  She does have this very small amount of free fluid in the abdomen of unclear etiology.  She had a prior EGD a few years ago which is entirely normal.  I suspect she could have a functional GI tract disorder in the setting of chronic narcotic use, narcotic bowel is possible.  I discussed options with her.  We will give her a trial of Reglan 5 mg with meals to see if that will help her gastric emptying.  I counseled her on the risks of Reglan including tardive dyskinesia, which is extremely rare especially at low doses.  She agreed to a trial of low-dose for a few weeks to see if it helps.  I think utility of repeating endoscopy is probably low especially in the setting of a recent CT scan showed a normal stomach.  Morbid obesity - BMI over 60 and persistent.  Discussed options with her.  I will refer to the weight loss clinic for further assessment and counseling.  She wishes to avoid bariatric surgery if at all possible.    History of Mohammad polyps - she is due for surveillance colonoscopy.  Given her body mass index she has higher than average risk for anesthesia in her case must be done at the hospital.  I discussed risks and benefits of colonoscopy and anesthesia with her and she wanted to proceed.  This will be scheduled to be done at the hospital next month.  I encouraged her to use the MiraLAX routinely prior to her bowel preparation to ensure she has a good prep.  She agreed  Pinckard Cellar, MD [facility]

## 2019-09-12 NOTE — Progress Notes (Signed)
HPI :  63 year old female here for a follow-up visit.  She is known to our office for prior EGD and colonoscopy, as well as for sclerosing mesenteritis.  She last saw [place] in 2017, she had an EGD and colonoscopy at that time.  Her EGD was unremarkable.  Her colonoscopy was remarkable for 6 polyps, most adenomas, and it was recommended that she have a 3-year follow-up colonoscopy.  She reports having chronic constipation.  She had an episode that was severe and led her to the ER when she did not have a bowel movement for 7 days.  She takes chronic narcotics for joint pains and back pain, she takes roughly 5 oxycodone per day.  She thinks her constipation is actually relatively recent.  She previously had a bowel movement every day, now goes every other day to every few days.  Taking a stool softener which is not really helping, not using much of anything else.  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  She was seen by rheumatology, Dr. Amil Amen, she states she was given a trial of steroids and she gained a significant amount of weight on that.  Her abdomen continues to bother her, she had a relatively recent CT scan however which showed no evidence of persistent sclerosing mesenteritis.  Her abdominal discomfort is in her upper abdomen.  She states it comes and goes, tends to feel worse with eating.  She has been having some early satiety and some nausea.  She denies any vomiting.  Eating seems to produce her symptoms.  She does find relief with Zofran.  She has bloating and discomfort in her upper to mid abdomen.  Having a bowel movement makes her feel better.  She has chronic back and shoulder pain which is why she refuses the amount of narcotics she does.  She is taking Protonix 40 mg once a day and seems to work okay for reflux symptoms.  She denies a history of diabetes  Her body mass index is over 60.  Her last colonoscopy was done at the hospital.  She  has been having a difficult time losing weight, is very frustrated by her lack of progress with this.  Asking for assistance.   CT scan 08/23/19 - IMPRESSION: 1. Small amount of perisplenic free fluid and small amount of free fluid within the pelvis of uncertain etiology. No free air identified. 2. Otherwise no CT evidence for acute intra-abdominal or pelvic abnormality.  Colonoscopy 07/10/16 - 6 polyps removed, 1 cm cecal AVM, hemorrhoids, diverticulosis - path shows most polyps adenomatous - repeat in 3 years EGD 07/10/16 - normal exam, biopsies obtained, negative for H pylori  Past Medical History:  Diagnosis Date   Acute lymphadenitis 2011   ALLERGIC RHINITIS 08/10/2007   ANXIETY 12/06/2007   Atherosclerotic peripheral vascular disease ([place]) 06/13/2013   Aorta on CT June 2014   BACK PAIN 12/06/2007   Cellulitis 2011   Cervical disc disease 03/09/2012   Chronic pain 03/09/2012   DEPRESSION 12/06/2007   DIVERTICULOSIS, [place] 12/06/2007   GERD 12/06/2007   Hepatitis    age 69 hepatitis A   HLD (hyperlipidemia) 05/24/2019   HYPERTENSION 12/06/2007   Impaired glucose tolerance 03/13/2012   LOW BACK PAIN 12/06/2007   Lumbar disc disease 03/09/2012   Mesenteric adenitis    MRSA 2006   Pain    by breast area   Pain in joint, multiple sites 04/21/2010   PARONYCHIA, FINGER 08/13/2009  Pneumonia sept 2015   walking pneumonia, sepsis    RASH-NONVESICULAR 05/09/2008   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[place]) 11/08/2017   SHOULDER PAIN, LEFT 05/09/2008   SINUSITIS- ACUTE-NOS 05/09/2008   THORACIC/LUMBOSACRAL NEURITIS/RADICULITIS UNSPEC 12/28/2008     Past Surgical History:  Procedure Laterality Date   ABDOMINAL HYSTERECTOMY  1999   1 ovary left   bloo clot removed from neck   may 25th , june 2. 2010   [place] SURGERY  may 24th 2010   COLONOSCOPY WITH PROPOFOL N/A 07/10/2016   Procedure: COLONOSCOPY WITH PROPOFOL;  Surgeon: Manus Gunning, MD;  Location: [facility];  Service: Gastroenterology;  Laterality: N/A;   ESOPHAGOGASTRODUODENOSCOPY (EGD) WITH PROPOFOL N/A 07/10/2016   Procedure: ESOPHAGOGASTRODUODENOSCOPY (EGD) WITH PROPOFOL;  Surgeon: Manus Gunning, MD;  Location: [facility] END[facility]COPY;  Service: Gastroenterology;  Laterality: N/A;   s/p ovary cyst     s/p right knee arthroscopy     Dr. Mardelle Matte ortho   Family History  Problem Relation Age of Onset   Heart disease Mother    Hypertension Mother    Diabetes Mother    Heart failure Mother    Asthma Sister    Hypertension Father    Asthma Daughter    Cancer Maternal Uncle        [place]   Cancer Other        ovarian   Liver cancer Paternal Grandmother        ????   Social History   Tobacco Use   Smoking status: Former Smoker    Years: 30.00    Quit date: 11/08/2008    Years since quitting: 10.8   Smokeless tobacco: Never Used   Tobacco comment: quit 10/09  Substance Use Topics   Alcohol use: No    Comment: quit drinking 10/07/1997   Drug use: No   Current Outpatient Medications  Medication Sig Dispense Refill   acetaminophen (TYLENOL) 325 MG tablet Take 650 mg by mouth every 6 (six) hours as needed for headache (pain).     clonazePAM (KLONOPIN) 1 MG tablet TAKE 1 TABLET BY MOUTH 2 TIMES DAILY AS NEEDED FOR ANXIETY 60 tablet 5   cyclobenzaprine (FLEXERIL) 10 MG tablet TAKE 1 TABLET BY MOUTH 2 TIMES A DAY AS NEEDED FOR MUSCLE SPASMS 180 tablet 1   furosemide (LASIX) 40 MG tablet TAKE 1 TABLET BY MOUTH DAILY (Patient taking differently: Take 40 mg by mouth daily as needed (hands and ankle swelling). ) 90 tablet 0   losartan (COZAAR) 100 MG tablet Take 1 tablet (100 mg total) by mouth daily. 90 tablet 3   NARCAN 4 MG/0.1ML LIQD nasal spray kit See admin instructions. As directed.     ondansetron (ZOFRAN ODT) 4 MG disintegrating tablet Take 1 tablet (4 mg total) by mouth every 4 (four) hours as needed for nausea or vomiting. 20 tablet 0   ondansetron  (ZOFRAN) 4 MG tablet TAKE 1 TABLET BY MOUTH EVERY 8 HOURS AS NEEDED FOR NAUSEA OR VOMITING (Patient taking differently: Take 4 mg by mouth every 8 (eight) hours as needed for nausea or vomiting. ) 40 tablet 0   Oxycodone HCl 10 MG TABS Take 1 tablet (10 mg total) 4 (four) times daily by mouth. Per [facility] (Patient taking differently: Take 10 mg by mouth 5 (five) times daily. Per [facility]) 30 tablet 0   pantoprazole (PROTONIX) 40 MG tablet Take 1 tablet (40 mg total) by mouth daily. 90 tablet 3   potassium chloride (K-DUR)  10 MEQ tablet TAKE 1 TABLET BY MOUTH DAILY 90 tablet 0   rosuvastatin (CRESTOR) 10 MG tablet Take 1 tablet (10 mg total) by mouth daily. 90 tablet 3   sertraline (ZOLOFT) 100 MG tablet TAKE 1 & ONE-HALF TABLET BY MOUTH EVERY DAY 135 tablet 0   Tetrahydrozoline HCl (VISINE OP) Place 1 drop into both eyes daily.     triamcinolone (NASACORT) 55 MCG/ACT AERO nasal inhaler Place 2 sprays into the nose daily. (Patient taking differently: Place 2 sprays into the nose daily as needed (sneezing/runny nose). ) 1 Inhaler 12   No current facility-administered medications for this visit.    Allergies  Allergen Reactions   Amoxicillin-Pot Clavulanate Nausea And Vomiting    Projectile vomiting Has patient had a PCN reaction causing immediate rash, facial/tongue/throat swelling, SOB or lightheadedness with hypotension: no Has patient had a PCN reaction causing severe rash involving mucus membranes or skin necrosis: No Has patient had a PCN reaction that required hospitalization: No Has patient had a PCN reaction occurring within the last 10 years: 42-30 yrs old If all of the above answers are "NO", then may proceed with Cephalosporin use.    Adhesive [Tape] Other (See Comments)    Tears skin off - use paper tape    Codeine Other (See Comments)    hallucinations   Lactose Intolerance (Gi) Nausea And Vomiting   Morphine And Related Nausea And Vomiting  and Other (See Comments)    Migraine headaches   Vicodin [Hydrocodone-Acetaminophen] Other (See Comments)    hallucinations   Latex Rash     Review of Systems: All systems reviewed and negative except where noted in HPI.    Ct Abdomen Pelvis W Contrast  Result Date: 08/23/2019 CLINICAL DATA:  Abdominal pain EXAM: CT ABDOMEN AND PELVIS WITH CONTRAST TECHNIQUE: Multidetector CT imaging of the abdomen and pelvis was performed using the standard protocol following bolus administration of intravenous contrast. CONTRAST:  176m OMNIPAQUE IOHEXOL 300 MG/ML  SOLN COMPARISON:  CT 05/14/2018 FINDINGS: Lower chest: Lung bases demonstrate no acute consolidation or effusion. The heart size is upper limits of normal Hepatobiliary: No focal liver abnormality is seen. No gallstones, gallbladder wall thickening, or biliary dilatation. Pancreas: Unremarkable. No pancreatic ductal dilatation or surrounding inflammatory changes. Spleen: Normal in size without focal abnormality. Adrenals/Urinary Tract: Adrenal glands are unremarkable. Kidneys are normal, without renal calculi, focal lesion, or hydronephrosis. Bladder is unremarkable. Stomach/Bowel: Stomach is within normal limits. Fluid-filled small bowel without significant dilatation or bowel wall thickening. Negative appendix. Sigmoid Mcshea diverticula without acute inflammatory change Vascular/Lymphatic: Moderate aortic atherosclerosis. No aneurysm. No significantly enlarged lymph nodes Reproductive: Status post hysterectomy. No adnexal masses. Other: No free air. Small free fluid in the pelvis. Tiny amount of perisplenic fluid. Small fluid in the left lower quadrant and interspersed among bowel loops in the pelvis. Mild hazy infiltration of the mesenteric root as before. Fat containing umbilical Musculoskeletal: No acute or suspicious osseous abnormality IMPRESSION: 1. Small amount of perisplenic free fluid and small amount of free fluid within the pelvis of  uncertain etiology. No free air identified. 2. Otherwise no CT evidence for acute intra-abdominal or pelvic abnormality. Electronically Signed   By: KDonavan FoilM.D.   On: 08/23/2019 19:21   Lab Results  Component Value Date   WBC 9.6 08/23/2019   HGB 13.1 08/23/2019   HCT 41.7 08/23/2019   MCV 93.1 08/23/2019   PLT 219 08/23/2019    Lab Results  Component Value Date  CREATININE 1.00 08/23/2019   BUN 16 08/23/2019   NA 137 08/23/2019   K 4.2 08/23/2019   CL 101 08/23/2019   CO2 28 08/23/2019    Lab Results  Component Value Date   ALT 20 08/23/2019   AST 22 08/23/2019   ALKPHOS 68 08/23/2019   BILITOT 0.7 08/23/2019     Physical Exam: BP 130/76    Pulse 80    Temp 98 F (36.7 C) (Oral)    Ht '5\' 5"'  (1.651 m)    Wt (!) 361 lb (163.7 kg)    BMI 60.07 kg/m  Constitutional: Pleasant,well-developed, female in no acute distress. HEENT: Normocephalic and atraumatic. Conjunctivae are normal. No scleral icterus. Neck supple.  Cardiovascular: Normal rate, regular rhythm.  Pulmonary/chest: Effort normal and breath sounds normal. No wheezing, rales or rhonchi. Abdominal: Soft, nondistended, protuberant, nontender. There are no masses palpable. No hepatomegaly. Extremities: no edema Lymphadenopathy: No cervical adenopathy noted. Neurological: Alert and oriented to person place and time. Skin: Skin is warm and dry. No rashes noted. Psychiatric: Normal mood and affect. Behavior is normal.   ASSESSMENT AND PLAN: 63 y/o female here for reassessment of the following issues:  Constipation - ongoing constipation in the setting of chronic narcotic use.  She has been feeling poorly relatively recently with this, recommend she stop the stool softeners and try MiraLAX a few times a day.  She can titrate that up as needed.  If this does not provide her benefit, I asked her to contact me and we will try another option.  She agreed  Early satiety / nausea / Abdominal discomfort - chronic  abdominal discomfort, recent CT scan does not show any persistence of history of sclerosing mesenteriitis, this was previously treated by Rheumatology but don't see any evidence of this on the most recent CT.  She does have this very small amount of free fluid in the abdomen of unclear etiology.  She had a prior EGD a few years ago which is entirely normal.  I suspect she could have a functional GI tract disorder in the setting of chronic narcotic use, narcotic bowel is possible.  I discussed options with her.  We will give her a trial of Reglan 5 mg with meals to see if that will help her gastric emptying.  I counseled her on the risks of Reglan including tardive dyskinesia, which is extremely rare especially at low doses.  She agreed to a trial of low-dose for a few weeks to see if it helps.  I think utility of repeating endoscopy is probably low especially in the setting of a recent CT scan showed a normal stomach.  Morbid obesity - BMI over 60 and persistent.  Discussed options with her.  I will refer to the weight loss clinic for further assessment and counseling.  She wishes to avoid bariatric surgery if at all possible.    History of [place] polyps - she is due for surveillance colonoscopy.  Given her body mass index she has higher than average risk for anesthesia in her case must be done at the hospital.  I discussed risks and benefits of colonoscopy and anesthesia with her and she wanted to proceed.  This will be scheduled to be done at the hospital next month.  I encouraged her to use the MiraLAX routinely prior to her bowel preparation to ensure she has a good prep.  She agreed  Evart Cellar, MD [facility]

## 2019-09-19 DIAGNOSIS — M545 Low back pain: Secondary | ICD-10-CM | POA: Diagnosis not present

## 2019-09-19 DIAGNOSIS — M25551 Pain in right hip: Secondary | ICD-10-CM | POA: Diagnosis not present

## 2019-09-19 DIAGNOSIS — G894 Chronic pain syndrome: Secondary | ICD-10-CM | POA: Diagnosis not present

## 2019-09-19 DIAGNOSIS — G8929 Other chronic pain: Secondary | ICD-10-CM | POA: Diagnosis not present

## 2019-09-19 DIAGNOSIS — M79661 Pain in right lower leg: Secondary | ICD-10-CM | POA: Diagnosis not present

## 2019-09-19 DIAGNOSIS — Z79891 Long term (current) use of opiate analgesic: Secondary | ICD-10-CM | POA: Diagnosis not present

## 2019-09-19 DIAGNOSIS — M25512 Pain in left shoulder: Secondary | ICD-10-CM | POA: Diagnosis not present

## 2019-09-22 ENCOUNTER — Other Ambulatory Visit (HOSPITAL_COMMUNITY)
Admission: RE | Admit: 2019-09-22 | Discharge: 2019-09-22 | Disposition: A | Discharge status: Home / Self Care | Payer: Medicare Other | Source: Ambulatory Visit | Attending: Gastroenterology | Admitting: Gastroenterology

## 2019-09-22 ENCOUNTER — Encounter (HOSPITAL_COMMUNITY): Payer: Self-pay | Admitting: *Deleted

## 2019-09-22 DIAGNOSIS — Z20828 Contact with and (suspected) exposure to other viral communicable diseases: Secondary | ICD-10-CM | POA: Diagnosis not present

## 2019-09-22 DIAGNOSIS — Z01812 Encounter for preprocedural laboratory examination: Secondary | ICD-10-CM | POA: Insufficient documentation

## 2019-09-22 NOTE — Progress Notes (Addendum)
Left message for patient to call back regarding colonoscopy with Dr Havery Moros on 09/26/2019.   1345  Spoke with patient via phone. She is to arrive 0945 on 09/26/2019 to admitting for colonoscopy with Dr Havery Moros. She has bowel prep and verbalizes understanding of instructions. Has had covid test this AM and reviewed instructions for self quarantine with patient. Her friend will be driving her.

## 2019-09-24 LAB — NOVEL CORONAVIRUS, NAA (HOSP ORDER, SEND-OUT TO REF LAB; TAT 18-24 HRS): SARS-CoV-2, NAA: NOT DETECTED

## 2019-09-25 ENCOUNTER — Telehealth: Payer: Self-pay

## 2019-09-25 NOTE — Progress Notes (Signed)
Left message for pre call

## 2019-09-25 NOTE — Telephone Encounter (Signed)
Patient called back, let her know Dr. Duanne Guess had a cancellation which sifted his schedule some,  and that her procedure would need to start 65mins. Later. Asked her to arrive at 10:15am. She was good with that

## 2019-09-25 NOTE — Telephone Encounter (Signed)
Left message for patient to please call back, reference a slight change in the time of her procedure tomorrow.

## 2019-09-26 ENCOUNTER — Encounter (HOSPITAL_COMMUNITY)
Admission: RE | Disposition: A | Discharge status: Home / Self Care | Payer: Self-pay | Source: Home / Self Care | Attending: Gastroenterology

## 2019-09-26 ENCOUNTER — Other Ambulatory Visit: Payer: Self-pay

## 2019-09-26 ENCOUNTER — Ambulatory Visit (HOSPITAL_COMMUNITY): Payer: Medicare Other | Admitting: Registered Nurse

## 2019-09-26 ENCOUNTER — Encounter (HOSPITAL_COMMUNITY): Payer: Self-pay | Admitting: *Deleted

## 2019-09-26 ENCOUNTER — Ambulatory Visit (HOSPITAL_COMMUNITY)
Admission: RE | Admit: 2019-09-26 | Discharge: 2019-09-26 | Disposition: A | Discharge status: Home / Self Care | Payer: Medicare Other | Attending: Gastroenterology | Admitting: Gastroenterology

## 2019-09-26 DIAGNOSIS — Z860100 Personal history of colon polyps, unspecified: Secondary | ICD-10-CM

## 2019-09-26 DIAGNOSIS — K219 Gastro-esophageal reflux disease without esophagitis: Secondary | ICD-10-CM | POA: Diagnosis not present

## 2019-09-26 DIAGNOSIS — D127 Benign neoplasm of rectosigmoid junction: Secondary | ICD-10-CM | POA: Insufficient documentation

## 2019-09-26 DIAGNOSIS — Z1211 Encounter for screening for malignant neoplasm of colon: Secondary | ICD-10-CM

## 2019-09-26 DIAGNOSIS — Z6841 Body Mass Index (BMI) 40.0 and over, adult: Secondary | ICD-10-CM | POA: Insufficient documentation

## 2019-09-26 DIAGNOSIS — K635 Polyp of colon: Secondary | ICD-10-CM | POA: Diagnosis not present

## 2019-09-26 DIAGNOSIS — K5520 Angiodysplasia of colon without hemorrhage: Secondary | ICD-10-CM | POA: Insufficient documentation

## 2019-09-26 DIAGNOSIS — G4730 Sleep apnea, unspecified: Secondary | ICD-10-CM | POA: Insufficient documentation

## 2019-09-26 DIAGNOSIS — D122 Benign neoplasm of ascending colon: Secondary | ICD-10-CM | POA: Diagnosis not present

## 2019-09-26 DIAGNOSIS — K5730 Diverticulosis of large intestine without perforation or abscess without bleeding: Secondary | ICD-10-CM | POA: Insufficient documentation

## 2019-09-26 DIAGNOSIS — D123 Benign neoplasm of transverse colon: Secondary | ICD-10-CM | POA: Insufficient documentation

## 2019-09-26 DIAGNOSIS — Z79899 Other long term (current) drug therapy: Secondary | ICD-10-CM | POA: Diagnosis not present

## 2019-09-26 DIAGNOSIS — Z79891 Long term (current) use of opiate analgesic: Secondary | ICD-10-CM | POA: Insufficient documentation

## 2019-09-26 DIAGNOSIS — I10 Essential (primary) hypertension: Secondary | ICD-10-CM | POA: Insufficient documentation

## 2019-09-26 DIAGNOSIS — E785 Hyperlipidemia, unspecified: Secondary | ICD-10-CM | POA: Insufficient documentation

## 2019-09-26 DIAGNOSIS — D126 Benign neoplasm of colon, unspecified: Secondary | ICD-10-CM

## 2019-09-26 HISTORY — PX: POLYPECTOMY: SHX5525

## 2019-09-26 HISTORY — PX: COLONOSCOPY WITH PROPOFOL: SHX5780

## 2019-09-26 SURGERY — Surgical Case
Anesthesia: *Unknown

## 2019-09-26 SURGERY — COLONOSCOPY WITH PROPOFOL
Anesthesia: Monitor Anesthesia Care

## 2019-09-26 MED ORDER — EPHEDRINE SULFATE-NACL 50-0.9 MG/10ML-% IV SOSY
PREFILLED_SYRINGE | INTRAVENOUS | Status: DC | PRN
  Administered 2019-09-26 (×2): 5 mg via INTRAVENOUS

## 2019-09-26 MED ORDER — SODIUM CHLORIDE 0.9 % IV SOLN: INTRAVENOUS | Status: DC

## 2019-09-26 MED ORDER — PROPOFOL 10 MG/ML IV BOLUS
INTRAVENOUS | Status: AC
  Filled 2019-09-26: qty 20

## 2019-09-26 MED ORDER — PROPOFOL 10 MG/ML IV BOLUS
INTRAVENOUS | Status: DC | PRN
  Administered 2019-09-26: 20 mg via INTRAVENOUS

## 2019-09-26 MED ORDER — PROPOFOL 500 MG/50ML IV EMUL
INTRAVENOUS | Status: DC | PRN
  Administered 2019-09-26: 130 ug/kg/min via INTRAVENOUS

## 2019-09-26 MED ORDER — LACTATED RINGERS IV SOLN
INTRAVENOUS | Status: AC | PRN
  Administered 2019-09-26: 1000 mL via INTRAVENOUS

## 2019-09-26 MED ORDER — PROPOFOL 10 MG/ML IV BOLUS
INTRAVENOUS | Status: AC
  Filled 2019-09-26: qty 60

## 2019-09-26 SURGICAL SUPPLY — 22 items

## 2019-09-26 NOTE — Discharge Instructions (Signed)

## 2019-09-26 NOTE — Transfer of Care (Signed)
Immediate Anesthesia Transfer of Care Note  Patient: Maria [name]  Procedure(s) Performed: COLONOSCOPY WITH PROPOFOL (N/A ) POLYPECTOMY  Patient Location: PACU and Endoscopy Unit  Anesthesia Type:MAC  Level of Consciousness: awake, alert , oriented and patient cooperative  Airway & Oxygen Therapy: Patient Spontanous Breathing and Patient connected to face mask oxygen  Post-op Assessment: Report given to RN, Post -op Vital signs reviewed and stable and Patient moving all extremities  Post vital signs: Reviewed and stable  Last Vitals:  Vitals Value Taken Time  BP    Temp    Pulse    Resp    SpO2      Last Pain:  Vitals:   09/26/19 1106  TempSrc: Oral  PainSc: 0-No pain         Complications: No apparent anesthesia complications

## 2019-09-26 NOTE — Op Note (Signed)
[facility] Patient Name: Maria [name] Procedure Date: 09/26/2019 MRN: [id number] Attending MD: Carlota Raspberry. Havery Moros , MD Date of Birth: 11/30/1956 CSN: [id number] Age: 63 Admit Type: Outpatient Procedure:                Colonoscopy Indications:              Surveillance: Personal history of adenomatous                            polyps (6) on last colonoscopy 3 years ago Providers:                Remo Lipps P. Havery Moros, MD, Josie Dixon, RN,                            Elmer Ramp. Tilden Dome, RN, Lina Sar, Technician,                            Calhan Armistead, CRNA Referring MD:              Medicines:                Monitored Anesthesia Care Complications:            No immediate complications. Estimated blood loss:                            Minimal. Estimated Blood Loss:     Estimated blood loss was minimal. Procedure:                Pre-Anesthesia Assessment:                           - Prior to the procedure, a History and Physical                            was performed, and patient medications and                            allergies were reviewed. The patient's tolerance of                            previous anesthesia was also reviewed. The risks                            and benefits of the procedure and the sedation                            options and risks were discussed with the patient.                            All questions were answered, and informed consent                            was obtained. Prior Anticoagulants: The patient has  taken no previous anticoagulant or antiplatelet                            agents. ASA Grade Assessment: III - A patient with                            severe systemic disease. After reviewing the risks                            and benefits, the patient was deemed in                            satisfactory condition to undergo the procedure.                           After  obtaining informed consent, the colonoscope                            was passed under direct vision. Throughout the                            procedure, the patient's blood pressure, pulse, and                            oxygen saturations were monitored continuously. The                            CF-HQ190L ([id number]) Olympus colonoscope was                            introduced through the anus and advanced to the the                            cecum, identified by appendiceal orifice and                            ileocecal valve. The colonoscopy was performed                            without difficulty. The patient tolerated the                            procedure well. The quality of the bowel                            preparation was good. The ileocecal valve,                            appendiceal orifice, and rectum were photographed. Scope In: 11:37:28 AM Scope Out: 12:04:39 PM Scope Withdrawal Time: 0 hours 20 minutes 31 seconds  Total Procedure Duration: 0 hours 27 minutes 11 seconds  Findings:      The perianal and digital rectal examinations were normal.      A single large angiodysplastic lesion was found in the  cecum.      A diminutive polyp was found in the ascending Vorhees. The polyp was       sessile. The polyp was removed with a cold snare. Resection and       retrieval were complete.      A 4 mm polyp was found in the transverse Floresca. The polyp was flat. The       polyp was removed with a cold snare. Resection and retrieval were       complete.      A 3 mm polyp was found in the recto-sigmoid Munroe. The polyp was       sessile. The polyp was removed with a cold snare. Resection and       retrieval were complete.      Multiple small-mouthed diverticula were found in the sigmoid Goldman.      The exam was otherwise without abnormality. Impression:               - A single colonic angiodysplastic lesion.                           - One diminutive polyp in the  ascending [place],                            removed with a cold snare. Resected and retrieved.                           - One 4 mm polyp in the transverse Lipkin, removed                            with a cold snare. Resected and retrieved.                           - One 3 mm polyp at the recto-sigmoid Ulatowski,                            removed with a cold snare. Resected and retrieved.                           - Diverticulosis in the sigmoid [place].                           - The examination was otherwise normal. Moderate Sedation:      No moderate sedation, case performed with MAC Recommendation:           - Patient has a contact number available for                            emergencies. The signs and symptoms of potential                            delayed complications were discussed with the                            patient. Return to normal activities tomorrow.  Written discharge instructions were provided to the                            patient.                           - Resume previous diet.                           - Continue present medications.                           - Await pathology results. Procedure Code(s):        --- Professional ---                           [phone], Colonoscopy, flexible; with removal of                            tumor(s), polyp(s), or other lesion(s) by snare                            technique Diagnosis Code(s):        --- Professional ---                           Z86.010, Personal history of colonic polyps                           K55.20, Angiodysplasia of Nase without hemorrhage                           K63.5, Polyp of Schoon                           K57.30, Diverticulosis of large intestine without                            perforation or abscess without bleeding CPT copyright 2019 American Medical Association. All rights reserved. The codes documented in this report are preliminary and upon coder  review may  be revised to meet current compliance requirements. Remo Lipps P. Shawntee Mainwaring, MD 09/26/2019 12:17:00 PM This report has been signed electronically. Number of Addenda: 0

## 2019-09-26 NOTE — Anesthesia Preprocedure Evaluation (Addendum)
Anesthesia Evaluation  Patient identified by MRN, date of birth, ID band Patient awake    Reviewed: Allergy & Precautions, NPO status , Patient's Chart, lab work & pertinent test results  History of Anesthesia Complications Negative for: history of anesthetic complications  Airway Mallampati: II  TM Distance: >3 FB Neck ROM: Full    Dental   Pulmonary sleep apnea , former smoker,    Pulmonary exam normal        Cardiovascular hypertension, + Peripheral Vascular Disease  Normal cardiovascular exam     Neuro/Psych PSYCHIATRIC DISORDERS Anxiety Depression negative neurological ROS     GI/Hepatic Neg liver ROS, GERD  ,  Endo/Other  Morbid obesity  Renal/GU negative Renal ROS  negative genitourinary   Musculoskeletal  (+) Fibromyalgia -  Abdominal   Peds  Hematology negative hematology ROS (+)   Anesthesia Other Findings   Reproductive/Obstetrics                            Anesthesia Physical Anesthesia Plan  ASA: III  Anesthesia Plan: MAC   Post-op Pain Management:    Induction: Intravenous  PONV Risk Score and Plan: Propofol infusion, TIVA and Treatment may vary due to age or medical condition  Airway Management Planned: Natural Airway, Nasal Cannula and Simple Face Mask  Additional Equipment: None  Intra-op Plan:   Post-operative Plan:   Informed Consent: I have reviewed the patients History and Physical, chart, labs and discussed the procedure including the risks, benefits and alternatives for the proposed anesthesia with the patient or authorized representative who has indicated his/her understanding and acceptance.       Plan Discussed with:   Anesthesia Plan Comments:        Anesthesia Quick Evaluation

## 2019-09-26 NOTE — Interval H&P Note (Signed)
History and Physical Interval Note:  09/26/2019 11:10 AM  Maria [name]  has presented today for surgery, with the diagnosis of [place] ca screening.  The various methods of treatment have been discussed with the patient and family. After consideration of risks, benefits and other options for treatment, the patient has consented to  Procedure(s): COLONOSCOPY WITH PROPOFOL (N/A) as a surgical intervention.  The patient's history has been reviewed, patient examined, no change in status, stable for surgery.  I have reviewed the patient's chart and labs.  Questions were answered to the patient's satisfaction.     [place]

## 2019-09-26 NOTE — Anesthesia Postprocedure Evaluation (Signed)
Anesthesia Post Note  Patient: Maria [name]  Procedure(s) Performed: COLONOSCOPY WITH PROPOFOL (N/A ) POLYPECTOMY     Patient location during evaluation: Endoscopy Anesthesia Type: MAC Level of consciousness: awake and alert Pain management: pain level controlled Vital Signs Assessment: post-procedure vital signs reviewed and stable Respiratory status: spontaneous breathing, nonlabored ventilation and respiratory function stable Cardiovascular status: blood pressure returned to baseline and stable Postop Assessment: no apparent nausea or vomiting Anesthetic complications: no    Last Vitals:  Vitals:   09/26/19 1230 09/26/19 1240  BP: (!) 109/41 (!) 104/34  Pulse: 76 73  Resp: 16 15  Temp:    SpO2: 100% 99%    Last Pain:  Vitals:   09/26/19 1240  TempSrc:   PainSc: 0-No pain                 Lidia Collum

## 2019-09-27 ENCOUNTER — Encounter (HOSPITAL_COMMUNITY): Payer: Self-pay | Admitting: Gastroenterology

## 2019-09-27 LAB — SURGICAL PATHOLOGY

## 2019-09-28 ENCOUNTER — Ambulatory Visit: Payer: Medicare Other

## 2019-09-28 ENCOUNTER — Encounter: Payer: Self-pay | Admitting: Gastroenterology

## 2019-10-02 ENCOUNTER — Telehealth: Payer: Self-pay | Admitting: Gastroenterology

## 2019-10-02 NOTE — Telephone Encounter (Signed)
Pt would like to discuss results of [place].

## 2019-10-02 NOTE — Telephone Encounter (Signed)
Called patient and reviewed her colonoscopy result letter with her. She had not gotten her letter yet

## 2019-10-04 ENCOUNTER — Other Ambulatory Visit: Payer: Self-pay

## 2019-10-04 ENCOUNTER — Ambulatory Visit
Admission: RE | Admit: 2019-10-04 | Discharge: 2019-10-04 | Disposition: A | Discharge status: Home / Self Care | Payer: Medicare Other | Source: Ambulatory Visit | Attending: Internal Medicine | Admitting: Internal Medicine

## 2019-10-04 DIAGNOSIS — Z1231 Encounter for screening mammogram for malignant neoplasm of breast: Secondary | ICD-10-CM | POA: Diagnosis not present

## 2019-10-06 ENCOUNTER — Other Ambulatory Visit: Payer: Self-pay | Admitting: Internal Medicine

## 2019-10-06 DIAGNOSIS — R928 Other abnormal and inconclusive findings on diagnostic imaging of breast: Secondary | ICD-10-CM

## 2019-10-10 ENCOUNTER — Ambulatory Visit
Admission: RE | Admit: 2019-10-10 | Discharge: 2019-10-10 | Disposition: A | Discharge status: Home / Self Care | Payer: Medicare Other | Source: Ambulatory Visit | Attending: Internal Medicine | Admitting: Internal Medicine

## 2019-10-10 ENCOUNTER — Other Ambulatory Visit: Payer: Self-pay

## 2019-10-10 ENCOUNTER — Other Ambulatory Visit: Payer: Self-pay | Admitting: Internal Medicine

## 2019-10-10 DIAGNOSIS — N6310 Unspecified lump in the right breast, unspecified quadrant: Secondary | ICD-10-CM

## 2019-10-10 DIAGNOSIS — R928 Other abnormal and inconclusive findings on diagnostic imaging of breast: Secondary | ICD-10-CM

## 2019-10-10 DIAGNOSIS — N6325 Unspecified lump in the left breast, overlapping quadrants: Secondary | ICD-10-CM | POA: Diagnosis not present

## 2019-10-16 ENCOUNTER — Ambulatory Visit
Admission: RE | Admit: 2019-10-16 | Discharge: 2019-10-16 | Disposition: A | Discharge status: Home / Self Care | Payer: Medicare Other | Source: Ambulatory Visit | Attending: Internal Medicine | Admitting: Internal Medicine

## 2019-10-16 ENCOUNTER — Other Ambulatory Visit: Payer: Self-pay

## 2019-10-16 DIAGNOSIS — D241 Benign neoplasm of right breast: Secondary | ICD-10-CM | POA: Diagnosis not present

## 2019-10-16 DIAGNOSIS — N6310 Unspecified lump in the right breast, unspecified quadrant: Secondary | ICD-10-CM

## 2019-10-16 DIAGNOSIS — N6311 Unspecified lump in the right breast, upper outer quadrant: Secondary | ICD-10-CM | POA: Diagnosis not present

## 2019-10-16 DIAGNOSIS — N6312 Unspecified lump in the right breast, upper inner quadrant: Secondary | ICD-10-CM | POA: Diagnosis not present

## 2019-10-20 DIAGNOSIS — M25551 Pain in right hip: Secondary | ICD-10-CM | POA: Diagnosis not present

## 2019-10-20 DIAGNOSIS — Z79891 Long term (current) use of opiate analgesic: Secondary | ICD-10-CM | POA: Diagnosis not present

## 2019-10-20 DIAGNOSIS — M79661 Pain in right lower leg: Secondary | ICD-10-CM | POA: Diagnosis not present

## 2019-10-20 DIAGNOSIS — M25512 Pain in left shoulder: Secondary | ICD-10-CM | POA: Diagnosis not present

## 2019-10-20 DIAGNOSIS — M542 Cervicalgia: Secondary | ICD-10-CM | POA: Diagnosis not present

## 2019-10-20 DIAGNOSIS — G8929 Other chronic pain: Secondary | ICD-10-CM | POA: Diagnosis not present

## 2019-10-20 DIAGNOSIS — G894 Chronic pain syndrome: Secondary | ICD-10-CM | POA: Diagnosis not present

## 2019-11-10 ENCOUNTER — Other Ambulatory Visit: Payer: Self-pay | Admitting: Internal Medicine

## 2019-11-10 NOTE — Telephone Encounter (Signed)
Done erx 

## 2019-11-15 ENCOUNTER — Other Ambulatory Visit: Payer: Self-pay | Admitting: Internal Medicine

## 2019-11-17 ENCOUNTER — Other Ambulatory Visit: Payer: Self-pay | Admitting: Internal Medicine

## 2019-11-20 DIAGNOSIS — Z79891 Long term (current) use of opiate analgesic: Secondary | ICD-10-CM | POA: Diagnosis not present

## 2019-11-20 DIAGNOSIS — M25551 Pain in right hip: Secondary | ICD-10-CM | POA: Diagnosis not present

## 2019-11-20 DIAGNOSIS — M25512 Pain in left shoulder: Secondary | ICD-10-CM | POA: Diagnosis not present

## 2019-11-20 DIAGNOSIS — G8929 Other chronic pain: Secondary | ICD-10-CM | POA: Diagnosis not present

## 2019-11-20 DIAGNOSIS — M79661 Pain in right lower leg: Secondary | ICD-10-CM | POA: Diagnosis not present

## 2019-11-20 DIAGNOSIS — G894 Chronic pain syndrome: Secondary | ICD-10-CM | POA: Diagnosis not present

## 2019-11-20 DIAGNOSIS — M545 Low back pain: Secondary | ICD-10-CM | POA: Diagnosis not present

## 2019-12-19 ENCOUNTER — Telehealth: Payer: Self-pay

## 2019-12-19 NOTE — Telephone Encounter (Signed)
Patient returned call. Agreeable to virtual fitness class. Forwarded invite for tomorrow's class and asked for patient to look for invite again 15 min prior to class reach out if she has difficulty with access.  She also said she has a foot bike provided by [facility] [employer] that the RN will show her how to use which she can take advantage of to improve her fitness.

## 2019-12-19 NOTE — Telephone Encounter (Signed)
LVMT patient requesting call back about virtual exercise program on M/W at 10am.

## 2019-12-20 ENCOUNTER — Other Ambulatory Visit: Payer: Self-pay | Admitting: *Deleted

## 2019-12-20 ENCOUNTER — Other Ambulatory Visit: Payer: Self-pay | Admitting: Internal Medicine

## 2019-12-20 DIAGNOSIS — F4310 Post-traumatic stress disorder, unspecified: Secondary | ICD-10-CM
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
## 2019-12-26 DIAGNOSIS — G894 Chronic pain syndrome: Secondary | ICD-10-CM | POA: Diagnosis not present

## 2019-12-26 DIAGNOSIS — G8929 Other chronic pain: Secondary | ICD-10-CM | POA: Diagnosis not present

## 2019-12-26 DIAGNOSIS — M25551 Pain in right hip: Secondary | ICD-10-CM | POA: Diagnosis not present

## 2019-12-26 DIAGNOSIS — M25512 Pain in left shoulder: Secondary | ICD-10-CM | POA: Diagnosis not present

## 2019-12-26 DIAGNOSIS — M79661 Pain in right lower leg: Secondary | ICD-10-CM | POA: Diagnosis not present

## 2019-12-26 DIAGNOSIS — M545 Low back pain: Secondary | ICD-10-CM | POA: Diagnosis not present

## 2019-12-26 DIAGNOSIS — M542 Cervicalgia: Secondary | ICD-10-CM | POA: Diagnosis not present

## 2019-12-26 DIAGNOSIS — Z79891 Long term (current) use of opiate analgesic: Secondary | ICD-10-CM | POA: Diagnosis not present

## 2019-12-28 ENCOUNTER — Other Ambulatory Visit: Payer: Self-pay | Admitting: *Deleted

## 2019-12-28 ENCOUNTER — Encounter: Payer: Self-pay | Admitting: *Deleted

## 2019-12-28 NOTE — Patient Outreach (Signed)
[name]) Care Management  12/28/2019  Maria [name] [birth year] [id number]   CSW made an initial attempt to try and contact patient today to perform the initial phone assessment, as well as assess and assist with social work needs and services, without success.  A HIPAA compliant message was left for patient on voicemail.  CSW is currently awaiting a return call.  CSW will make a second outreach attempt within the next 3-4 business days, if a return call is not received from patient in the meantime.  CSW will also mail an Outreach Letter to patient's home requesting that patient contact CSW if patient is interested in receiving social work services through [place] with Scientist, clinical (histocompatibility and immunogenetics).  Nat Christen, BSW, MSW, LCSW  Licensed Education officer, environmental Health System  Mailing [place] [address], [place], [place] [address] Physical Address-[address], [place], [place] [address] Toll Free Main # [phone] Fax # [phone] Cell # [phone]  Office # [phone] [email or web address]

## 2020-01-03 ENCOUNTER — Other Ambulatory Visit: Payer: Self-pay | Admitting: *Deleted

## 2020-01-03 NOTE — Patient Outreach (Signed)
[facility]) Care Management  01/03/2020  [name] 08-03-1956 [id number]   CSW made a second attempt to try and contact patient today to perform phone assessment, as well as assess and assist with social work needs and services, without success.  A HIPAA compliant message was left for patient on voicemail.  CSW continues to await a return call.  CSW will make a third and final outreach attempt within the next 3-4 business days, if a return call is not received from patient in the meantime.  CSW will then proceed with case closure if a return call is not received from patient with a total of 10 business days, as required number of phone attempts will have been made and outreach letter mailed.   Nat Christen, BSW, MSW, LCSW  Licensed Education officer, environmental Health System  Mailing [place] [address], [place], [place] [address] Physical Address[address], [place], [place] [address] Toll Free Main # [phone] Fax # [phone] Cell # [phone]  Office # [phone] [email or web address]

## 2020-01-04 ENCOUNTER — Ambulatory Visit: Payer: Medicare Other

## 2020-01-09 ENCOUNTER — Encounter: Payer: Self-pay | Admitting: *Deleted

## 2020-01-09 ENCOUNTER — Other Ambulatory Visit: Payer: Self-pay | Admitting: *Deleted
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
## 2020-01-17 ENCOUNTER — Other Ambulatory Visit: Payer: Self-pay | Admitting: *Deleted

## 2020-01-17 NOTE — Patient Outreach (Signed)
[place] [facility]) Care Management  01/17/2020  Maria [name] [birth year] [id number]   CSW was able to make contact with patient today to follow-up regarding social work services and resources, as well as to ensure that patient received the packet of resource information mailed to her home by CSW.  Patient admitted to receiving the list of therapists in [place] that accept [facility], as well as the list of mental health facilities in [place] and the [facility] information pertaining to depression.  Patient indicated that she has begun making calls to various female therapists in [facility], trying to establish counseling services, but has been unsuccessful thus far, due to them not currently accepting new clients.  Patient agreed to continue to outreach to the remaining therapists on the list to obtain counseling services.  Patient was unable to talk long today, reporting that her sister was due to arrive at her home at any time, "to get her out of the house for a bit".  Patient went on to say that her sister is good about trying to occupy her time, and that they were planning to go to [employer] to pick up more yarn to finish her quilt, a source of therapy and relaxation for patient.  Patient admitted to feeling good today and confirmed that she continues to take her psychotropic medications exactly as prescribed.  Patient encouraged CSW to contact her again next week, when she will have had an opportunity to make additional calls to try and establish counseling services.  CSW agreed to contact patient on Wednesday, January 24, 2020, around 12:00pm.  Patient is aware that she can contact CSW if she requires social work services in the meantime.  Nat Christen, BSW, MSW, LCSW  Licensed Education officer, environmental Health System  Mailing [place] [address], [place], [place] [address] Physical Address[address], [place], [place] [address] Toll Free Main # [phone] Fax # [phone] Cell # [phone]  Office # [phone] [email or web address]

## 2020-01-23 DIAGNOSIS — M79661 Pain in right lower leg: Secondary | ICD-10-CM | POA: Diagnosis not present

## 2020-01-23 DIAGNOSIS — M25551 Pain in right hip: Secondary | ICD-10-CM | POA: Diagnosis not present

## 2020-01-23 DIAGNOSIS — Z79891 Long term (current) use of opiate analgesic: Secondary | ICD-10-CM | POA: Diagnosis not present

## 2020-01-23 DIAGNOSIS — G8929 Other chronic pain: Secondary | ICD-10-CM | POA: Diagnosis not present

## 2020-01-23 DIAGNOSIS — G894 Chronic pain syndrome: Secondary | ICD-10-CM | POA: Diagnosis not present

## 2020-01-23 DIAGNOSIS — M545 Low back pain: Secondary | ICD-10-CM | POA: Diagnosis not present

## 2020-01-23 DIAGNOSIS — M25512 Pain in left shoulder: Secondary | ICD-10-CM | POA: Diagnosis not present

## 2020-01-24 ENCOUNTER — Other Ambulatory Visit: Payer: Self-pay | Admitting: *Deleted

## 2020-01-24 ENCOUNTER — Encounter: Payer: Self-pay | Admitting: *Deleted

## 2020-01-24 NOTE — Patient Outreach (Signed)
[facility]) Care Management  01/24/2020  Maria [name] 28-Jan-1956 [id number]   CSW was able to make contact with patient today to follow-up regarding social work services and resources, as well as to try and assist patient with arranging counseling and supportive services in the community.  Patient continues to deny wanting to receive counseling and supportive services through [place] with [place] [facility] [facility], nor was patient interested in having CSW assist her with arranging counseling services with a provider in the community.  CSW will perform a case closure on patient, as all goals of treatment have been met from social work standpoint and no additional social work needs have been identified at this time.  CSW will notify patient's RNCM with [facility], [name] of CSW's plans to close patient's case.  CSW will fax an update to patient's Primary Care Physician, Dr. Cathlean Cower to ensure that they are aware of CSW's involvement with patient's plan of care.    Nat Christen, BSW, MSW, LCSW  Licensed Education officer, environmental Health System  Mailing [place] [address], [place], [place] [address] Physical Address[address], [place], [place] [address] Toll Free Main # [phone] Fax # [phone] Cell # [phone]  Office # [phone] [email or web address]

## 2020-01-26 ENCOUNTER — Ambulatory Visit: Payer: Medicare Other | Admitting: Internal Medicine

## 2020-01-31 ENCOUNTER — Ambulatory Visit (INDEPENDENT_AMBULATORY_CARE_PROVIDER_SITE_OTHER): Payer: Medicare Other | Admitting: Internal Medicine

## 2020-01-31 ENCOUNTER — Encounter: Payer: Self-pay | Admitting: Internal Medicine

## 2020-01-31 ENCOUNTER — Other Ambulatory Visit: Payer: Self-pay

## 2020-01-31 VITALS — BP 156/82 | HR 84 | Temp 98.8°F | Ht 65.0 in | Wt 367.6 lb

## 2020-01-31 DIAGNOSIS — R221 Localized swelling, mass and lump, neck: Secondary | ICD-10-CM | POA: Diagnosis not present

## 2020-01-31 DIAGNOSIS — E785 Hyperlipidemia, unspecified: Secondary | ICD-10-CM | POA: Diagnosis not present

## 2020-01-31 DIAGNOSIS — E611 Iron deficiency: Secondary | ICD-10-CM | POA: Diagnosis not present

## 2020-01-31 DIAGNOSIS — R739 Hyperglycemia, unspecified: Secondary | ICD-10-CM | POA: Diagnosis not present

## 2020-01-31 DIAGNOSIS — E559 Vitamin D deficiency, unspecified: Secondary | ICD-10-CM

## 2020-01-31 DIAGNOSIS — L719 Rosacea, unspecified: Secondary | ICD-10-CM

## 2020-01-31 DIAGNOSIS — E538 Deficiency of other specified B group vitamins: Secondary | ICD-10-CM | POA: Diagnosis not present

## 2020-01-31 DIAGNOSIS — Z0000 Encounter for general adult medical examination without abnormal findings: Secondary | ICD-10-CM | POA: Diagnosis not present

## 2020-01-31 DIAGNOSIS — Z0001 Encounter for general adult medical examination with abnormal findings: Secondary | ICD-10-CM

## 2020-01-31 MED ORDER — FUROSEMIDE 40 MG PO TABS: 40.0000 mg | ORAL_TABLET | Freq: Every day | ORAL | 3 refills | Status: DC

## 2020-01-31 MED ORDER — METRONIDAZOLE 0.75 % EX CREA: TOPICAL_CREAM | Freq: Two times a day (BID) | CUTANEOUS | 5 refills | Status: DC

## 2020-01-31 MED ORDER — AZITHROMYCIN 250 MG PO TABS: ORAL_TABLET | ORAL | 1 refills | Status: DC

## 2020-01-31 NOTE — Assessment & Plan Note (Signed)
stable overall by history and exam, recent data reviewed with pt, and pt to continue medical treatment as before,  to f/u any worsening symptoms or concerns  

## 2020-01-31 NOTE — Progress Notes (Signed)
Subjective:    Patient ID: Maria [name], female    DOB: 1956-08-03, 64 y.o.   MRN: [id number]  HPI  Here for wellness and f/u;  Overall doing ok;  Pt denies Chest pain, worsening SOB, DOE, wheezing, orthopnea, PND, worsening LE edema, palpitations, dizziness or syncope.  Pt denies neurological change such as new headache, facial or extremity weakness.  Pt denies polydipsia, polyuria, or low sugar symptoms. Pt states overall good compliance with treatment and medications, good tolerability, and has been trying to follow appropriate diet.  Pt denies worsening depressive symptoms, suicidal ideation or panic. No fever, night sweats, wt loss, loss of appetite, or other constitutional symptoms.  Pt states good ability with ADL's, has low fall risk, home safety reviewed and adequate, no other significant changes in hearing or vision, and only occasionally active with exercise. Also c/o bilat erythem facies rash without fever, pain or swelling x 2 mo , constant, not better with topical creams, Also c/o 1 mo ongoing left submandibular and neck swelling vague, without fever but remains somewhat swelling as well Past Medical History:  Diagnosis Date  . Acute lymphadenitis 2011  . ALLERGIC RHINITIS 08/10/2007  . ANXIETY 12/06/2007  . Atherosclerotic peripheral vascular disease ([place]) 06/13/2013   Aorta on CT June 2014  . BACK PAIN 12/06/2007  . Cellulitis 2011  . Cervical disc disease 03/09/2012  . Chronic pain 03/09/2012  . DEPRESSION 12/06/2007  . DIVERTICULOSIS, [place] 12/06/2007  . GERD 12/06/2007  . Hepatitis    age 53 hepatitis A  . HLD (hyperlipidemia) 05/24/2019  . HYPERTENSION 12/06/2007  . Impaired glucose tolerance 03/13/2012  . LOW BACK PAIN 12/06/2007  . Lumbar disc disease 03/09/2012  . Mesenteric adenitis   . MRSA 2006  . Pain    by breast area  . Pain in joint, multiple sites 04/21/2010  . PARONYCHIA, FINGER 08/13/2009  . Pneumonia sept 2015   walking pneumonia, sepsis   .  RASH-NONVESICULAR 05/09/2008  . 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 SHOULDER PAIN, LEFT 05/09/2008  . SINUSITIS- ACUTE-NOS 05/09/2008  . THORACIC/LUMBOSACRAL NEURITIS/RADICULITIS UNSPEC 12/28/2008   Past Surgical History:  Procedure Laterality Date  . ABDOMINAL HYSTERECTOMY  1999   1 ovary left  . bloo clot removed from neck   may 25th , june 2. 2010  . [place] SURGERY  may 24th 2010  . COLONOSCOPY WITH PROPOFOL N/A 07/10/2016   Procedure: COLONOSCOPY WITH PROPOFOL;  Surgeon: Manus Gunning, MD;  Location: [facility];  Service: Gastroenterology;  Laterality: N/A;  . COLONOSCOPY WITH PROPOFOL N/A 09/26/2019   Procedure: COLONOSCOPY WITH PROPOFOL;  Surgeon: Yetta Flock, MD;  Location: [facility];  Service: Gastroenterology;  Laterality: N/A;  . ESOPHAGOGASTRODUODENOSCOPY (EGD) WITH PROPOFOL N/A 07/10/2016   Procedure: ESOPHAGOGASTRODUODENOSCOPY (EGD) WITH PROPOFOL;  Surgeon: Manus Gunning, MD;  Location: [facility];  Service: Gastroenterology;  Laterality: N/A;  . POLYPECTOMY  09/26/2019   Procedure: POLYPECTOMY;  Surgeon: Yetta Flock, MD;  Location: [facility];  Service: Gastroenterology;;  . s/p ovary cyst    . s/p right knee arthroscopy     Dr. Mardelle Matte ortho    reports that she quit smoking about 11 years ago. She quit after 30.00 years of use. She has never used smokeless tobacco. She reports that she does not drink alcohol or use drugs. family history includes Asthma in her daughter and sister; Cancer in her maternal uncle and another family member; Diabetes in her mother; Heart disease in her mother; Heart  failure in her mother; Hypertension in her father and mother; Liver cancer in her paternal grandmother. Allergies  Allergen Reactions  . Amoxicillin-Pot Clavulanate Nausea And Vomiting    Projectile vomiting Did it involve swelling of the face/tongue/throat, SOB, or low BP? No Did it involve sudden or severe rash/hives, skin peeling,  or any reaction on the inside of your mouth or nose? No Did you need to seek medical attention at a hospital or doctor's office? No When did it last happen?20-30 years If all above answers are "NO", may proceed with cephalosporin use.   . Adhesive [Tape] Other (See Comments)    Tears skin off - use paper tape   . Codeine Other (See Comments)    hallucinations  . Crestor [Rosuvastatin Calcium]     Severe muscle cramps  . Lactose Intolerance (Gi) Nausea And Vomiting  . Morphine And Related Nausea And Vomiting and Other (See Comments)    Migraine headaches  . Vicodin [Hydrocodone-Acetaminophen] Other (See Comments)    hallucinations  . Latex Rash   Current Outpatient Medications on File Prior to Visit  Medication Sig Dispense Refill  . acetaminophen (TYLENOL) 325 MG tablet Take 650 mg by mouth every 6 (six) hours as needed for headache (pain).    . clonazePAM (KLONOPIN) 1 MG tablet TAKE 1 TABLET BY MOUTH 2 TIMES A DAY AS NEEDED FOR ANXIETY 60 tablet 5  . cyclobenzaprine (FLEXERIL) 10 MG tablet TAKE 1 TABLET BY MOUTH 2 TIMES A DAY AS NEEDED FOR MUSCLE SPASMS (Patient taking differently: Take 10 mg by mouth 2 (two) times daily as needed for muscle spasms. ) 180 tablet 1  . losartan (COZAAR) 100 MG tablet Take 1 tablet (100 mg total) by mouth daily. 90 tablet 3  . NARCAN 4 MG/0.1ML LIQD nasal spray kit Place 0.4 mg into the nose daily as needed (opioid overdose).     . ondansetron (ZOFRAN ODT) 4 MG disintegrating tablet Take 1 tablet (4 mg total) by mouth every 4 (four) hours as needed for nausea or vomiting. 20 tablet 0  . ondansetron (ZOFRAN) 4 MG tablet TAKE 1 TABLET BY MOUTH EVERY 8 HOURS AS NEEDED FOR NAUSEA OR VOMITING 40 tablet 0  . Oxycodone HCl 10 MG TABS Take 1 tablet (10 mg total) 4 (four) times daily by mouth. Per [facility] (Patient taking differently: Take 10 mg by mouth every 4 (four) hours as needed (pain). Per [facility]) 30 tablet 0  . pantoprazole  (PROTONIX) 40 MG tablet Take 1 tablet (40 mg total) by mouth daily. 90 tablet 3  . polyethylene glycol (MIRALAX) 17 g packet Take once to twice daily 14 each 0  . potassium chloride (K-DUR) 10 MEQ tablet TAKE 1 TABLET BY MOUTH DAILY (Patient taking differently: Take 10 mEq by mouth daily as needed (cramps). ) 90 tablet 0  . rOPINIRole (REQUIP) 3 MG tablet Take 3 mg by mouth at bedtime as needed (restless legs).    . sertraline (ZOLOFT) 100 MG tablet TAKE 1 & ONE-HALF TABLET BY MOUTH EVERY DAY (Patient taking differently: Take 100 mg by mouth daily. ) 135 tablet 0  . SUPREP BOWEL PREP KIT 17.5-3.13-1.6 GM/177ML SOLN Suprep-Use as directed 354 mL 0  . Tetrahydrozoline HCl (VISINE OP) Place 1 drop into both eyes daily as needed (dry eyes).     . triamcinolone (NASACORT) 55 MCG/ACT AERO nasal inhaler Place 2 sprays into the nose daily. (Patient taking differently: Place 2 sprays into the nose daily as needed (  sneezing/runny nose). ) 1 Inhaler 12  . metoCLOPramide (REGLAN) 5 MG tablet Take 1 tablet (5 mg total) by mouth 3 (three) times daily before meals. (Patient not taking: Reported on 01/31/2020) 90 tablet 1  . [DISCONTINUED] DULoxetine (CYMBALTA) 60 MG capsule Take 60 mg by mouth daily.      . [DISCONTINUED] fexofenadine (ALLEGRA) 180 MG tablet Take 180 mg by mouth daily.       No current facility-administered medications on file prior to visit.   Review of Systems All otherwise neg per pt     Objective:   Physical Exam BP (!) 156/82   Pulse 84   Temp 98.8 F (37.1 C)   Ht '5\' 5"'  (1.651 m)   Wt (!) 367 lb 9.6 oz (166.7 kg)   SpO2 99%   BMI 61.17 kg/m' \VS'  noted,  Constitutional: Pt appears in NAD HENT: Head: NCAT.  Right Ear: External ear normal.  Left Ear: External ear normal.  Eyes: . Pupils are equal, round, and reactive to light. Conjunctivae and EOM are normal Nose: without d/c or deformity Neck: Neck supple. Gross normal ROM, no overt thyroid nodule or mass, has tender left  submandibular neck with non discrete swelling without overlying skin change or obviuos mass Cardiovascular: Normal rate and regular rhythm.   Pulmonary/Chest: Effort normal and breath sounds without rales or wheezing.  Abd:  Soft, NT, ND, + BS, no organomegaly Neurological: Pt is alert. At baseline orientation, motor grossly intact Skin: Skin is warm. No rashes, other new lesions, no LE edema Psychiatric: Pt behavior is normal without agitation  All otherwise neg per pt Lab Results  Component Value Date   WBC 9.6 08/23/2019   HGB 13.1 08/23/2019   HCT 41.7 08/23/2019   PLT 219 08/23/2019   GLUCOSE 153 (H) 08/23/2019   CHOL 199 01/25/2019   TRIG 278.0 (H) 01/25/2019   HDL 31.70 (L) 01/25/2019   LDLDIRECT 127.0 01/25/2019   LDLCALC 104 (H) 11/08/2017   ALT 20 08/23/2019   AST 22 08/23/2019   NA 137 08/23/2019   K 4.2 08/23/2019   CL 101 08/23/2019   CREATININE 1.00 08/23/2019   BUN 16 08/23/2019   CO2 28 08/23/2019   TSH 2.58 01/25/2019   INR 1.1 08/23/2019   HGBA1C 5.7 01/25/2019      Assessment & Plan:

## 2020-01-31 NOTE — Assessment & Plan Note (Signed)
Cont replacement 

## 2020-01-31 NOTE — Assessment & Plan Note (Addendum)
?  etiology , for zpack,  to f/u any worsening symptoms or concerns, consider CT neck  I spent 31 minutes preparing to see the patient by review of recent labs, imaging and procedures, obtaining and reviewing separately obtained history, communicating with the patient and family or caregiver, ordering medications, tests or procedures, and documenting clinical information in the EHR including the differential Dx, treatment, and any further evaluation and other management of neck swelling, rosacea, hyperglycemia, b12 deficiency, HLD

## 2020-01-31 NOTE — Patient Instructions (Addendum)
Please take all new medication as prescribed - the cream for the face, and antibiotic  Please continue all other medications as before, and refills have been done if requested.  Please have the pharmacy call with any other refills you may need.  Please continue your efforts at being more active, low cholesterol diet, and weight control.  You are otherwise up to date with prevention measures today.  Please keep your appointments with your specialists as you may have planned  Please go to the LAB at the blood drawing area for the tests to be done  You will be contacted by phone if any changes need to be made immediately.  Otherwise, you will receive a letter about your results with an explanation, but please check with MyChart first.  Please remember to sign up for MyChart if you have not done so, as this will be important to you in the future with finding out test results, communicating by private email, and scheduling acute appointments online when needed.  Please make an Appointment to return in 6 months, or sooner if needed

## 2020-01-31 NOTE — Assessment & Plan Note (Signed)

## 2020-01-31 NOTE — Assessment & Plan Note (Signed)
For metrogel asd  to f/u any worsening symptoms or concerns

## 2020-02-01 LAB — CBC WITH DIFFERENTIAL/PLATELET
Basophils Absolute: 0.1 10*3/uL (ref 0.0–0.1)
Basophils Relative: 0.8 % (ref 0.0–3.0)
Eosinophils Absolute: 0.1 10*3/uL (ref 0.0–0.7)
Eosinophils Relative: 1 % (ref 0.0–5.0)
HCT: 40.1 % (ref 36.0–46.0)
Hemoglobin: 13.4 g/dL (ref 12.0–15.0)
Lymphocytes Relative: 25.5 % (ref 12.0–46.0)
Lymphs Abs: 2.1 10*3/uL (ref 0.7–4.0)
MCHC: 33.4 g/dL (ref 30.0–36.0)
MCV: 87.7 fl (ref 78.0–100.0)
Monocytes Absolute: 0.6 10*3/uL (ref 0.1–1.0)
Monocytes Relative: 7.5 % (ref 3.0–12.0)
Neutro Abs: 5.3 10*3/uL (ref 1.4–7.7)
Neutrophils Relative %: 65.2 % (ref 43.0–77.0)
Platelets: 245 10*3/uL (ref 150.0–400.0)
RBC: 4.57 Mil/uL (ref 3.87–5.11)
RDW: 13.9 % (ref 11.5–15.5)
WBC: 8.2 10*3/uL (ref 4.0–10.5)

## 2020-02-01 LAB — LIPID PANEL
Cholesterol: 188 mg/dL (ref 0–200)
HDL: 45.7 mg/dL (ref >39.00)
LDL Cholesterol: 114 mg/dL — ABNORMAL HIGH (ref 0–99)
NonHDL: 142.13
Total CHOL/HDL Ratio: 4
Triglycerides: 140 mg/dL (ref 0.0–149.0)
VLDL: 28 mg/dL (ref 0.0–40.0)

## 2020-02-01 LAB — URINALYSIS, ROUTINE W REFLEX MICROSCOPIC
Bilirubin Urine: NEGATIVE
Hgb urine dipstick: NEGATIVE
Ketones, ur: NEGATIVE
Nitrite: NEGATIVE
RBC / HPF: NONE SEEN (ref 0–?)
Specific Gravity, Urine: 1.02 (ref 1.000–1.030)
Total Protein, Urine: NEGATIVE
Urine Glucose: NEGATIVE
Urobilinogen, UA: 0.2 (ref 0.0–1.0)
pH: 7.5 (ref 5.0–8.0)

## 2020-02-01 LAB — HEPATIC FUNCTION PANEL
ALT: 19 U/L (ref 0–35)
AST: 20 U/L (ref 0–37)
Albumin: 4.2 g/dL (ref 3.5–5.2)
Alkaline Phosphatase: 79 U/L (ref 39–117)
Bilirubin, Direct: 0.1 mg/dL (ref 0.0–0.3)
Total Bilirubin: 0.6 mg/dL (ref 0.2–1.2)
Total Protein: 7.7 g/dL (ref 6.0–8.3)

## 2020-02-01 LAB — IBC PANEL
Iron: 64 ug/dL (ref 42–145)
Saturation Ratios: 20.9 % (ref 20.0–50.0)
Transferrin: 219 mg/dL (ref 212.0–360.0)

## 2020-02-01 LAB — BASIC METABOLIC PANEL
BUN: 13 mg/dL (ref 6–23)
CO2: 27 mEq/L (ref 19–32)
Calcium: 9.3 mg/dL (ref 8.4–10.5)
Chloride: 103 mEq/L (ref 96–112)
Creatinine, Ser: 0.88 mg/dL (ref 0.40–1.20)
GFR: 64.96 mL/min (ref >60.00)
Glucose, Bld: 102 mg/dL — ABNORMAL HIGH (ref 70–99)
Potassium: 4.1 mEq/L (ref 3.5–5.1)
Sodium: 138 mEq/L (ref 135–145)

## 2020-02-01 LAB — HEMOGLOBIN A1C: Hgb A1c MFr Bld: 6.3 % (ref 4.6–6.5)

## 2020-02-01 LAB — TSH: TSH: 1.08 u[IU]/mL (ref 0.35–4.50)

## 2020-02-01 LAB — VITAMIN B12: Vitamin B-12: 204 pg/mL — ABNORMAL LOW (ref 211–911)

## 2020-02-01 LAB — VITAMIN D 25 HYDROXY (VIT D DEFICIENCY, FRACTURES): VITD: 14.9 ng/mL — ABNORMAL LOW (ref 30.00–100.00)

## 2020-02-03 ENCOUNTER — Encounter: Payer: Self-pay | Admitting: Internal Medicine

## 2020-02-03 ENCOUNTER — Other Ambulatory Visit: Payer: Self-pay | Admitting: Internal Medicine

## 2020-02-03 MED ORDER — VITAMIN B-12 1000 MCG PO TABS: 1000.0000 ug | ORAL_TABLET | Freq: Every day | ORAL | 3 refills | Status: DC

## 2020-02-03 MED ORDER — VITAMIN D (ERGOCALCIFEROL) 1.25 MG (50000 UNIT) PO CAPS: 50000.0000 [IU] | ORAL_CAPSULE | ORAL | 0 refills | Status: DC

## 2020-02-05 MED ORDER — VITAMIN D (ERGOCALCIFEROL) 1.25 MG (50000 UNIT) PO CAPS: 50000.0000 [IU] | ORAL_CAPSULE | ORAL | 0 refills | Status: DC

## 2020-02-05 MED ORDER — VITAMIN B-12 1000 MCG PO TABS: 1000.0000 ug | ORAL_TABLET | Freq: Every day | ORAL | 3 refills | Status: DC

## 2020-02-05 NOTE — Addendum Note (Signed)
Addended by: Breck Coons on: 02/05/2020 09:16 AM   Modules accepted: Orders

## 2020-02-06 ENCOUNTER — Other Ambulatory Visit: Payer: Self-pay

## 2020-02-06 ENCOUNTER — Ambulatory Visit (INDEPENDENT_AMBULATORY_CARE_PROVIDER_SITE_OTHER): Payer: Medicare Other | Admitting: Psychiatry

## 2020-02-06 ENCOUNTER — Encounter (HOSPITAL_COMMUNITY): Payer: Self-pay | Admitting: Psychiatry

## 2020-02-06 DIAGNOSIS — F4310 Post-traumatic stress disorder, unspecified: Secondary | ICD-10-CM | POA: Diagnosis not present
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
## 2020-02-20 ENCOUNTER — Telehealth: Payer: Self-pay | Admitting: Internal Medicine

## 2020-02-20 DIAGNOSIS — M25551 Pain in right hip: Secondary | ICD-10-CM | POA: Diagnosis not present

## 2020-02-20 DIAGNOSIS — M545 Low back pain: Secondary | ICD-10-CM | POA: Diagnosis not present

## 2020-02-20 DIAGNOSIS — M25512 Pain in left shoulder: Secondary | ICD-10-CM | POA: Diagnosis not present

## 2020-02-20 DIAGNOSIS — G8929 Other chronic pain: Secondary | ICD-10-CM | POA: Diagnosis not present

## 2020-02-20 DIAGNOSIS — M79661 Pain in right lower leg: Secondary | ICD-10-CM | POA: Diagnosis not present

## 2020-02-20 DIAGNOSIS — Z79891 Long term (current) use of opiate analgesic: Secondary | ICD-10-CM | POA: Diagnosis not present

## 2020-02-20 DIAGNOSIS — G894 Chronic pain syndrome: Secondary | ICD-10-CM | POA: Diagnosis not present

## 2020-02-20 NOTE — Telephone Encounter (Signed)
New message:   Pt states at her last appt she was having some throat pain and Dr suggested that she could be sent for a CT scan if things did not get better. Pt states she would like to be referred for the CT due to her throat not getting any better.

## 2020-02-20 NOTE — Telephone Encounter (Signed)
On further review, I found that she has seen ENT at [facility] in Aug 2020 with the findings"  1. Throat pain  Neck asymmetry is likely scarring and platysmal banding related to prior surgeries. No indications for repeat imaging. Repeat thyroid ultrasound in February demonstrates no significant changes. She continues to have GERD and GERD symptoms likely due to her body habitus. We encourage dietary and lifestyle modifications on our discussion today. She should continue her PPI and H2 blocker.   So on further consideration, I would not pursue a CT scan at this time that would increase risk related to dye and radiation, after neck swelling already addressed by ENT and no further evaluation needed

## 2020-02-21 NOTE — Telephone Encounter (Signed)
Pt contacted and informed of PCP response. Pt stated understanding.

## 2020-02-23 ENCOUNTER — Other Ambulatory Visit: Payer: Self-pay | Admitting: Internal Medicine

## 2020-02-27 ENCOUNTER — Ambulatory Visit (INDEPENDENT_AMBULATORY_CARE_PROVIDER_SITE_OTHER): Payer: Medicare Other | Admitting: Psychiatry

## 2020-02-27 ENCOUNTER — Encounter (HOSPITAL_COMMUNITY): Payer: Self-pay | Admitting: Psychiatry

## 2020-02-27 ENCOUNTER — Other Ambulatory Visit: Payer: Self-pay

## 2020-02-27 DIAGNOSIS — F4310 Post-traumatic stress disorder, unspecified: Secondary | ICD-10-CM
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
## 2020-03-12 ENCOUNTER — Other Ambulatory Visit: Payer: Self-pay | Admitting: Internal Medicine

## 2020-03-12 NOTE — Telephone Encounter (Signed)
Done erx 

## 2020-03-14 DIAGNOSIS — Z03818 Encounter for observation for suspected exposure to other biological agents ruled out: Secondary | ICD-10-CM | POA: Diagnosis not present

## 2020-03-18 ENCOUNTER — Other Ambulatory Visit: Payer: Self-pay | Admitting: Internal Medicine

## 2020-03-18 ENCOUNTER — Telehealth: Payer: Self-pay

## 2020-03-18 NOTE — Telephone Encounter (Deleted)
Error

## 2020-03-19 DIAGNOSIS — M545 Low back pain: Secondary | ICD-10-CM | POA: Diagnosis not present

## 2020-03-19 DIAGNOSIS — M25512 Pain in left shoulder: Secondary | ICD-10-CM | POA: Diagnosis not present

## 2020-03-19 DIAGNOSIS — M79661 Pain in right lower leg: Secondary | ICD-10-CM | POA: Diagnosis not present

## 2020-03-19 DIAGNOSIS — Z79891 Long term (current) use of opiate analgesic: Secondary | ICD-10-CM | POA: Diagnosis not present

## 2020-03-19 DIAGNOSIS — G8929 Other chronic pain: Secondary | ICD-10-CM | POA: Diagnosis not present

## 2020-03-19 DIAGNOSIS — M25551 Pain in right hip: Secondary | ICD-10-CM | POA: Diagnosis not present

## 2020-03-19 DIAGNOSIS — G894 Chronic pain syndrome: Secondary | ICD-10-CM | POA: Diagnosis not present

## 2020-03-19 DIAGNOSIS — M542 Cervicalgia: Secondary | ICD-10-CM | POA: Diagnosis not present

## 2020-03-21 ENCOUNTER — Ambulatory Visit: Payer: Medicare Other | Admitting: Internal Medicine

## 2020-04-11 ENCOUNTER — Telehealth (INDEPENDENT_AMBULATORY_CARE_PROVIDER_SITE_OTHER): Payer: Medicare Other | Admitting: Psychiatry

## 2020-04-11 ENCOUNTER — Other Ambulatory Visit: Payer: Self-pay

## 2020-04-11 DIAGNOSIS — F4310 Post-traumatic stress disorder, unspecified: Secondary | ICD-10-CM

## 2020-04-11 DIAGNOSIS — Z03818 Encounter for observation for suspected exposure to other biological agents ruled out: Secondary | ICD-10-CM | POA: Diagnosis not present
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
## 2020-04-16 DIAGNOSIS — M79661 Pain in right lower leg: Secondary | ICD-10-CM | POA: Diagnosis not present

## 2020-04-16 DIAGNOSIS — M545 Low back pain: Secondary | ICD-10-CM | POA: Diagnosis not present

## 2020-04-16 DIAGNOSIS — Z79891 Long term (current) use of opiate analgesic: Secondary | ICD-10-CM | POA: Diagnosis not present

## 2020-04-16 DIAGNOSIS — M25512 Pain in left shoulder: Secondary | ICD-10-CM | POA: Diagnosis not present

## 2020-04-16 DIAGNOSIS — M25551 Pain in right hip: Secondary | ICD-10-CM | POA: Diagnosis not present

## 2020-04-16 DIAGNOSIS — G8929 Other chronic pain: Secondary | ICD-10-CM | POA: Diagnosis not present

## 2020-04-16 DIAGNOSIS — G894 Chronic pain syndrome: Secondary | ICD-10-CM | POA: Diagnosis not present

## 2020-04-25 ENCOUNTER — Other Ambulatory Visit: Payer: Self-pay

## 2020-04-25 ENCOUNTER — Ambulatory Visit (INDEPENDENT_AMBULATORY_CARE_PROVIDER_SITE_OTHER): Payer: Medicare Other | Admitting: Psychiatry

## 2020-04-25 DIAGNOSIS — F4310 Post-traumatic stress disorder, unspecified: Secondary | ICD-10-CM | POA: Diagnosis not present
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
## 2020-05-01 ENCOUNTER — Encounter: Payer: Self-pay | Admitting: Internal Medicine

## 2020-05-01 ENCOUNTER — Other Ambulatory Visit: Payer: Self-pay

## 2020-05-01 ENCOUNTER — Ambulatory Visit (INDEPENDENT_AMBULATORY_CARE_PROVIDER_SITE_OTHER): Payer: Medicare Other | Admitting: Internal Medicine

## 2020-05-01 VITALS — BP 160/60 | HR 87 | Temp 99.0°F | Ht 65.0 in | Wt 377.0 lb

## 2020-05-01 DIAGNOSIS — R221 Localized swelling, mass and lump, neck: Secondary | ICD-10-CM | POA: Diagnosis not present

## 2020-05-01 DIAGNOSIS — R490 Dysphonia: Secondary | ICD-10-CM | POA: Diagnosis not present

## 2020-05-01 DIAGNOSIS — R739 Hyperglycemia, unspecified: Secondary | ICD-10-CM

## 2020-05-01 DIAGNOSIS — F411 Generalized anxiety disorder: Secondary | ICD-10-CM

## 2020-05-01 DIAGNOSIS — R070 Pain in throat: Secondary | ICD-10-CM

## 2020-05-01 DIAGNOSIS — G8929 Other chronic pain: Secondary | ICD-10-CM

## 2020-05-01 DIAGNOSIS — I890 Lymphedema, not elsewhere classified: Secondary | ICD-10-CM

## 2020-05-01 DIAGNOSIS — I10 Essential (primary) hypertension: Secondary | ICD-10-CM

## 2020-05-01 LAB — BASIC METABOLIC PANEL
BUN: 15 mg/dL (ref 6–23)
CO2: 29 mEq/L (ref 19–32)
Calcium: 8.9 mg/dL (ref 8.4–10.5)
Chloride: 102 mEq/L (ref 96–112)
Creatinine, Ser: 0.88 mg/dL (ref 0.40–1.20)
GFR: 64.9 mL/min (ref >60.00)
Glucose, Bld: 135 mg/dL — ABNORMAL HIGH (ref 70–99)
Potassium: 4 mEq/L (ref 3.5–5.1)
Sodium: 138 mEq/L (ref 135–145)

## 2020-05-01 NOTE — Assessment & Plan Note (Signed)
Cont f/u pain management 

## 2020-05-01 NOTE — Assessment & Plan Note (Signed)
For ent referral

## 2020-05-01 NOTE — Patient Instructions (Signed)
Ok to wean off the zoloft by taking 1 whole pill for 1 wk, then half pill for 1 wk, then stop  Please continue all other medications as before, including your BP pill as prescribed  Please have the pharmacy call with any other refills you may need.  Please continue your efforts at being more active, low cholesterol diet, and weight control.  Please keep your appointments with your specialists as you may have planned - pain management  You will be contacted regarding the referral for: ENT, and CT neck  Please go to the LAB at the blood drawing area for the tests to be done  You will be contacted by phone if any changes need to be made immediately.  Otherwise, you will receive a letter about your results with an explanation, but please check with MyChart first.  Please remember to sign up for MyChart if you have not done so, as this will be important to you in the future with finding out test results, communicating by private email, and scheduling acute appointments online when needed.

## 2020-05-01 NOTE — Assessment & Plan Note (Signed)
stable overall by history and exam, recent data reviewed with pt, and pt to continue medical treatment as before,  to f/u any worsening symptoms or concerns  

## 2020-05-01 NOTE — Assessment & Plan Note (Signed)
Left > right chronic persistent, stable

## 2020-05-01 NOTE — Assessment & Plan Note (Addendum)
For ct neck  I spent 41 minutes in preparing to see the patient by review of recent labs, imaging and procedures, obtaining and reviewing separately obtained history, communicating with the patient and family or caregiver, ordering medications, tests or procedures, and documenting clinical information in the EHR including the differential Dx, treatment, and any further evaluation and other management of neck swelling, anxiety, htn, hyperglycemia, lymphedema, chronic pain, hoearseness, throat pain

## 2020-05-01 NOTE — Assessment & Plan Note (Signed)
To restart bp med, cont to monitor bp at home and next visit

## 2020-05-01 NOTE — Progress Notes (Signed)
Subjective:    Patient ID: Maria [name], female    DOB: June 20, 1956, 64 y.o.   MRN: [id number]  HPI  Here to f/u with c/o persistent now worsening 1 wk throat swelling, pain and new hoarseness. Denies worsening depressive symptoms, suicidal ideation, or panic; has ongoing anxiety but Wants to be off sertraline due to possible wt gain, seeing therapist.  Pt denies chest pain, increased sob or doe, wheezing, orthopnea, PND, increased LE swelling, palpitations, dizziness or syncope.  Pt denies new neurological symptoms such as new headache, or facial or extremity weakness or numbness   Pt denies polydipsia, polyuria, Has ongoing chronic LBP and knees bilat pain, keeps gaining wt.  Has not taken bp med today Past Medical History:  Diagnosis Date  . Acute lymphadenitis 2011  . ALLERGIC RHINITIS 08/10/2007  . ANXIETY 12/06/2007  . Atherosclerotic peripheral vascular disease ([place]) 06/13/2013   Aorta on CT June 2014  . BACK PAIN 12/06/2007  . Cellulitis 2011  . Cervical disc disease 03/09/2012  . Chronic pain 03/09/2012  . DEPRESSION 12/06/2007  . DIVERTICULOSIS, [place] 12/06/2007  . GERD 12/06/2007  . Hepatitis    age 19 hepatitis A  . HLD (hyperlipidemia) 05/24/2019  . HYPERTENSION 12/06/2007  . Impaired glucose tolerance 03/13/2012  . LOW BACK PAIN 12/06/2007  . Lumbar disc disease 03/09/2012  . Mesenteric adenitis   . MRSA 2006  . Pain    by breast area  . Pain in joint, multiple sites 04/21/2010  . PARONYCHIA, FINGER 08/13/2009  . Pneumonia sept 2015   walking pneumonia, sepsis   . RASH-NONVESICULAR 05/09/2008  . 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 SHOULDER PAIN, LEFT 05/09/2008  . SINUSITIS- ACUTE-NOS 05/09/2008  . THORACIC/LUMBOSACRAL NEURITIS/RADICULITIS UNSPEC 12/28/2008   Past Surgical History:  Procedure Laterality Date  . ABDOMINAL HYSTERECTOMY  1999   1 ovary left  . bloo clot removed from neck   may 25th , june 2. 2010  . [place] SURGERY  may 24th 2010  .  COLONOSCOPY WITH PROPOFOL N/A 07/10/2016   Procedure: COLONOSCOPY WITH PROPOFOL;  Surgeon: Manus Gunning, MD;  Location: [facility];  Service: Gastroenterology;  Laterality: N/A;  . COLONOSCOPY WITH PROPOFOL N/A 09/26/2019   Procedure: COLONOSCOPY WITH PROPOFOL;  Surgeon: Yetta Flock, MD;  Location: [facility];  Service: Gastroenterology;  Laterality: N/A;  . ESOPHAGOGASTRODUODENOSCOPY (EGD) WITH PROPOFOL N/A 07/10/2016   Procedure: ESOPHAGOGASTRODUODENOSCOPY (EGD) WITH PROPOFOL;  Surgeon: Manus Gunning, MD;  Location: [facility];  Service: Gastroenterology;  Laterality: N/A;  . POLYPECTOMY  09/26/2019   Procedure: POLYPECTOMY;  Surgeon: Yetta Flock, MD;  Location: [facility];  Service: Gastroenterology;;  . s/p ovary cyst    . s/p right knee arthroscopy     Dr. Mardelle Matte ortho    reports that she quit smoking about 11 years ago. She quit after 30.00 years of use. She has never used smokeless tobacco. She reports that she does not drink alcohol or use drugs. family history includes Anxiety disorder in her sister; Asthma in her daughter and sister; Bipolar disorder in her daughter; Cancer in her maternal uncle and another family member; Depression in her sister; Diabetes in her mother; Heart disease in her mother; Heart failure in her mother; Hypertension in her father and mother; Liver cancer in her paternal grandmother. Allergies  Allergen Reactions  . Amoxicillin-Pot Clavulanate Nausea And Vomiting    Projectile vomiting Did it involve swelling of the face/tongue/throat, SOB, or low BP? No Did it involve  sudden or severe rash/hives, skin peeling, or any reaction on the inside of your mouth or nose? No Did you need to seek medical attention at a hospital or doctor's office? No When did it last happen?20-30 years If all above answers are "NO", may proceed with cephalosporin use.   . Adhesive [Tape] Other (See Comments)    Tears skin off - use paper  tape   . Codeine Other (See Comments)    hallucinations  . Crestor [Rosuvastatin Calcium]     Severe muscle cramps  . Lactose Intolerance (Gi) Nausea And Vomiting  . Morphine And Related Nausea And Vomiting and Other (See Comments)    Migraine headaches  . Vicodin [Hydrocodone-Acetaminophen] Other (See Comments)    hallucinations  . Latex Rash   Current Outpatient Medications on File Prior to Visit  Medication Sig Dispense Refill  . acetaminophen (TYLENOL) 325 MG tablet Take 650 mg by mouth every 6 (six) hours as needed for headache (pain).    . clonazePAM (KLONOPIN) 1 MG tablet TAKE 1 TABLET BY MOUTH 2 TIMES A DAY AS NEEDED FOR ANXIETY 60 tablet 5  . cyclobenzaprine (FLEXERIL) 10 MG tablet TAKE 1 TABLET BY MOUTH 2 TIMES A DAY AS NEEDED FOR MUSCLE SPASMS (Patient taking differently: Take 10 mg by mouth 2 (two) times daily as needed for muscle spasms. ) 180 tablet 1  . furosemide (LASIX) 40 MG tablet Take 1 tablet (40 mg total) by mouth daily. 90 tablet 3  . losartan (COZAAR) 100 MG tablet Take 1 tablet (100 mg total) by mouth daily. 90 tablet 3  . metroNIDAZOLE (METROCREAM) 0.75 % cream Apply topically 2 (two) times daily. 45 g 5  . NARCAN 4 MG/0.1ML LIQD nasal spray kit Place 0.4 mg into the nose daily as needed (opioid overdose).     . ondansetron (ZOFRAN ODT) 4 MG disintegrating tablet Take 1 tablet (4 mg total) by mouth every 4 (four) hours as needed for nausea or vomiting. 20 tablet 0  . Oxycodone HCl 10 MG TABS Take 1 tablet (10 mg total) 4 (four) times daily by mouth. Per [facility] (Patient taking differently: Take 10 mg by mouth every 4 (four) hours as needed (pain). Per [facility]) 30 tablet 0  . pantoprazole (PROTONIX) 40 MG tablet Take 1 tablet (40 mg total) by mouth daily. 90 tablet 3  . potassium chloride (K-DUR) 10 MEQ tablet TAKE 1 TABLET BY MOUTH DAILY (Patient taking differently: Take 10 mEq by mouth daily as needed (cramps). ) 90 tablet 0  . rOPINIRole  (REQUIP) 3 MG tablet Take 3 mg by mouth at bedtime as needed (restless legs).    . sertraline (ZOLOFT) 100 MG tablet TAKE 1 & ONE-HALF TABLET BY MOUTH EVERY DAY 135 tablet 1  . Tetrahydrozoline HCl (VISINE OP) Place 1 drop into both eyes daily as needed (dry eyes).     . triamcinolone (NASACORT) 55 MCG/ACT AERO nasal inhaler Place 2 sprays into the nose daily. (Patient taking differently: Place 2 sprays into the nose daily as needed (sneezing/runny nose). ) 1 Inhaler 12  . vitamin B-12 (CYANOCOBALAMIN) 1000 MCG tablet Take 1 tablet (1,000 mcg total) by mouth daily. 90 tablet 3  . Vitamin D, Ergocalciferol, (DRISDOL) 1.25 MG (50000 UNIT) CAPS capsule Take 1 capsule (50,000 Units total) by mouth every 7 (seven) days. 12 capsule 0  . [DISCONTINUED] DULoxetine (CYMBALTA) 60 MG capsule Take 60 mg by mouth daily.      . [DISCONTINUED] fexofenadine (ALLEGRA) 180  MG tablet Take 180 mg by mouth daily.       No current facility-administered medications on file prior to visit.   Review of Systems All otherwise neg per pt     Objective:   Physical Exam BP (!) 160/60 (BP Location: Left Arm, Patient Position: Sitting, Cuff Size: Large)   Pulse 87   Temp 99 F (37.2 C) (Oral)   Ht '5\' 5"'  (1.651 m)   Wt (!) 377 lb (171 kg)   SpO2 97%   BMI 62.74 kg/m  VS noted,  Constitutional: Pt appears in NAD HENT: Head: NCAT.  Right Ear: External ear normal.  Left Ear: External ear normal.  Throat without specific mass or tenderness or swelling appreciated Eyes: . Pupils are equal, round, and reactive to light. Conjunctivae and EOM are normal Nose: without d/c or deformity Neck: Neck supple. Gross normal ROM Cardiovascular: Normal rate and regular rhythm.   Pulmonary/Chest: Effort normal and breath sounds without rales or wheezing.  Abd:  Soft, NT, ND, + BS, no organomegaly Neurological: Pt is alert. At baseline orientation, motor grossly intact Skin: Skin is warm. No rashes, other new lesions, 2-3+ bilat  LE edema Psychiatric: Pt behavior is normal without agitation  All otherwise neg per pt Lab Results  Component Value Date   WBC 8.2 01/31/2020   HGB 13.4 01/31/2020   HCT 40.1 01/31/2020   PLT 245.0 01/31/2020   GLUCOSE 135 (H) 05/01/2020   CHOL 188 01/31/2020   TRIG 140.0 01/31/2020   HDL 45.70 01/31/2020   LDLDIRECT 127.0 01/25/2019   LDLCALC 114 (H) 01/31/2020   ALT 19 01/31/2020   AST 20 01/31/2020   NA 138 05/01/2020   K 4.0 05/01/2020   CL 102 05/01/2020   CREATININE 0.88 05/01/2020   BUN 15 05/01/2020   CO2 29 05/01/2020   TSH 1.08 01/31/2020   INR 1.1 08/23/2019   HGBA1C 6.3 01/31/2020          Assessment & Plan:

## 2020-05-07 DIAGNOSIS — M545 Low back pain: Secondary | ICD-10-CM | POA: Diagnosis not present

## 2020-05-07 DIAGNOSIS — M542 Cervicalgia: Secondary | ICD-10-CM | POA: Diagnosis not present

## 2020-05-07 DIAGNOSIS — Z79891 Long term (current) use of opiate analgesic: Secondary | ICD-10-CM | POA: Diagnosis not present

## 2020-05-07 DIAGNOSIS — G894 Chronic pain syndrome: Secondary | ICD-10-CM | POA: Diagnosis not present

## 2020-05-07 DIAGNOSIS — G8929 Other chronic pain: Secondary | ICD-10-CM | POA: Diagnosis not present

## 2020-05-07 DIAGNOSIS — M25551 Pain in right hip: Secondary | ICD-10-CM | POA: Diagnosis not present

## 2020-05-07 DIAGNOSIS — M25512 Pain in left shoulder: Secondary | ICD-10-CM | POA: Diagnosis not present

## 2020-05-07 DIAGNOSIS — M79661 Pain in right lower leg: Secondary | ICD-10-CM | POA: Diagnosis not present

## 2020-05-09 ENCOUNTER — Ambulatory Visit (INDEPENDENT_AMBULATORY_CARE_PROVIDER_SITE_OTHER): Payer: Medicare Other | Admitting: Psychiatry

## 2020-05-09 ENCOUNTER — Other Ambulatory Visit: Payer: Self-pay

## 2020-05-09 DIAGNOSIS — F4310 Post-traumatic stress disorder, unspecified: Secondary | ICD-10-CM | POA: Diagnosis not present
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
## 2020-05-16 DIAGNOSIS — H2513 Age-related nuclear cataract, bilateral: Secondary | ICD-10-CM | POA: Diagnosis not present

## 2020-05-16 DIAGNOSIS — H52223 Regular astigmatism, bilateral: Secondary | ICD-10-CM | POA: Diagnosis not present

## 2020-05-16 DIAGNOSIS — H57813 Brow ptosis, bilateral: Secondary | ICD-10-CM | POA: Diagnosis not present

## 2020-05-16 DIAGNOSIS — H5203 Hypermetropia, bilateral: Secondary | ICD-10-CM | POA: Diagnosis not present

## 2020-05-16 DIAGNOSIS — H524 Presbyopia: Secondary | ICD-10-CM | POA: Diagnosis not present

## 2020-05-17 ENCOUNTER — Inpatient Hospital Stay: Admission: RE | Admit: 2020-05-17 | Payer: Medicare Other | Source: Ambulatory Visit

## 2020-05-23 ENCOUNTER — Other Ambulatory Visit: Payer: Medicare Other

## 2020-05-23 ENCOUNTER — Ambulatory Visit
Admission: RE | Admit: 2020-05-23 | Discharge: 2020-05-23 | Disposition: A | Discharge status: Home / Self Care | Payer: Medicare Other | Source: Ambulatory Visit | Attending: Internal Medicine | Admitting: Internal Medicine

## 2020-05-23 ENCOUNTER — Other Ambulatory Visit: Payer: Self-pay

## 2020-05-23 DIAGNOSIS — R070 Pain in throat: Secondary | ICD-10-CM

## 2020-05-23 DIAGNOSIS — R490 Dysphonia: Secondary | ICD-10-CM

## 2020-05-23 DIAGNOSIS — R221 Localized swelling, mass and lump, neck: Secondary | ICD-10-CM

## 2020-05-23 MED ORDER — IOPAMIDOL (ISOVUE-300) INJECTION 61%
75.0000 mL | Freq: Once | INTRAVENOUS | Status: AC | PRN
  Administered 2020-05-23: 75 mL via INTRAVENOUS

## 2020-05-24 ENCOUNTER — Encounter: Payer: Self-pay | Admitting: Internal Medicine

## 2020-05-25 ENCOUNTER — Other Ambulatory Visit: Payer: Self-pay | Admitting: Internal Medicine

## 2020-05-27 ENCOUNTER — Telehealth: Payer: Self-pay

## 2020-05-27 ENCOUNTER — Other Ambulatory Visit: Payer: Self-pay | Admitting: Internal Medicine

## 2020-05-27 NOTE — Telephone Encounter (Signed)
Xanax refill already done June 6

## 2020-05-27 NOTE — Telephone Encounter (Addendum)
Spoke with pt informed her of negative CT results. Pt expressed concerns of her thyroid due to neck is still swollen and unable to speak clearly. Pt asked for an [place]. Forwarded pt concerns and request to Dr. [name] Reichmann.

## 2020-05-27 NOTE — Telephone Encounter (Signed)
No need as CT scans show better detail then u/s.  Ok to see ENT as planned, but otherwise I would have nothing else to offer

## 2020-05-28 ENCOUNTER — Ambulatory Visit (HOSPITAL_COMMUNITY): Payer: Medicare Other | Admitting: Psychiatry

## 2020-05-30 ENCOUNTER — Other Ambulatory Visit: Payer: Self-pay

## 2020-05-30 ENCOUNTER — Ambulatory Visit (INDEPENDENT_AMBULATORY_CARE_PROVIDER_SITE_OTHER): Payer: Medicare Other | Admitting: Otolaryngology

## 2020-05-30 ENCOUNTER — Telehealth: Payer: Self-pay | Admitting: Internal Medicine

## 2020-05-30 DIAGNOSIS — R42 Dizziness and giddiness: Secondary | ICD-10-CM | POA: Diagnosis not present

## 2020-05-30 DIAGNOSIS — R490 Dysphonia: Secondary | ICD-10-CM | POA: Diagnosis not present

## 2020-05-30 NOTE — Telephone Encounter (Signed)
New message:   Pt is calling with dizziness and thinks it may be from coming off of a medication. I have transferred the call to triage. Please advise.

## 2020-05-30 NOTE — Progress Notes (Addendum)
HPI: Maria [name] is a 64 y.o. female who presents is referred by Dr. [name] Reichmann for evaluation of thyroid, neck and voice complaints.  She apparently has had a previous ultrasound a couple years ago that showed benign 2 cm left thyroid nodule.  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  She has a family history of thyroid cancer.  She was concerned about some nodules on the left side of her neck as well as some change in her voice.  She recently underwent a CT scan of her neck that did not demonstrate any significant adenopathy or thyroid nodules.  She had previous anterior cervical fusion that was complicated performed 10 years ago through the left anterior neck. She is also complained of some recent dizziness that started about a week ago but does not really describe any spinning or vertigo.  She has had no problems with her hearing..  Past Medical History:  Diagnosis Date  . Acute lymphadenitis 2011  . ALLERGIC RHINITIS 08/10/2007  . ANXIETY 12/06/2007  . Atherosclerotic peripheral vascular disease ([place]) 06/13/2013   Aorta on CT June 2014  . BACK PAIN 12/06/2007  . Cellulitis 2011  . Cervical disc disease 03/09/2012  . Chronic pain 03/09/2012  . DEPRESSION 12/06/2007  . DIVERTICULOSIS, [place] 12/06/2007  . GERD 12/06/2007  . Hepatitis    age 58 hepatitis A  . HLD (hyperlipidemia) 05/24/2019  . HYPERTENSION 12/06/2007  . Impaired glucose tolerance 03/13/2012  . LOW BACK PAIN 12/06/2007  . Lumbar disc disease 03/09/2012  . Mesenteric adenitis   . MRSA 2006  . Pain    by breast area  . Pain in joint, multiple sites 04/21/2010  . PARONYCHIA, FINGER 08/13/2009  . Pneumonia sept 2015   walking pneumonia, sepsis   . RASH-NONVESICULAR 05/09/2008  . 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 SHOULDER PAIN, LEFT 05/09/2008  . SINUSITIS- ACUTE-NOS 05/09/2008  . THORACIC/LUMBOSACRAL NEURITIS/RADICULITIS UNSPEC 12/28/2008   Past Surgical History:  Procedure Laterality Date  . ABDOMINAL  HYSTERECTOMY  1999   1 ovary left  . bloo clot removed from neck   may 25th , june 2. 2010  . [place] SURGERY  may 24th 2010  . COLONOSCOPY WITH PROPOFOL N/A 07/10/2016   Procedure: COLONOSCOPY WITH PROPOFOL;  Surgeon: Manus Gunning, MD;  Location: [facility];  Service: Gastroenterology;  Laterality: N/A;  . COLONOSCOPY WITH PROPOFOL N/A 09/26/2019   Procedure: COLONOSCOPY WITH PROPOFOL;  Surgeon: Yetta Flock, MD;  Location: [facility];  Service: Gastroenterology;  Laterality: N/A;  . ESOPHAGOGASTRODUODENOSCOPY (EGD) WITH PROPOFOL N/A 07/10/2016   Procedure: ESOPHAGOGASTRODUODENOSCOPY (EGD) WITH PROPOFOL;  Surgeon: Manus Gunning, MD;  Location: [facility];  Service: Gastroenterology;  Laterality: N/A;  . POLYPECTOMY  09/26/2019   Procedure: POLYPECTOMY;  Surgeon: Yetta Flock, MD;  Location: [facility];  Service: Gastroenterology;;  . s/p ovary cyst    . s/p right knee arthroscopy     Dr. Mardelle Matte ortho   Social History   Socioeconomic History  . Marital status: Divorced    Spouse name: Not on file  . Number of children: 2  . Years of education: Not on file  . Highest education level: Not on file  Occupational History  . Occupation: disabled  Tobacco Use  . Smoking status: Former Smoker    Years: 30.00    Quit date: 11/08/2008    Years since quitting: 11.5  . Smokeless tobacco: Never Used  . Tobacco comment: quit 10/09  Vaping Use  . Vaping  Use: Never used  Substance and Sexual Activity  . Alcohol use: No    Comment: quit drinking 10/07/1997  . Drug use: No  . Sexual activity: Not Currently    Birth control/protection: Surgical, Abstinence  Other Topics Concern  . Not on file  Social History Narrative   Patient has difficulty financially affording food, medication, and affording essential bills. She states she has limited support from her daughter.    Social Determinants of Health   Financial Resource Strain:   . Difficulty  of Paying Living Expenses:   Food Insecurity:   . Worried About Charity fundraiser in the Last Year:   . Arboriculturist in the Last Year:   Transportation Needs:   . Film/video editor (Medical):   Marland Kitchen Lack of Transportation (Non-Medical):   Physical Activity:   . Days of Exercise per Week:   . Minutes of Exercise per Session:   Stress:   . Feeling of Stress :   Social Connections:   . Frequency of Communication with Friends and Family:   . Frequency of Social Gatherings with Friends and Family:   . Attends Religious Services:   . Active Member of Clubs or Organizations:   . Attends Archivist Meetings:   Marland Kitchen Marital Status:    Family History  Problem Relation Age of Onset  . Heart disease Mother   . Hypertension Mother   . Diabetes Mother   . Heart failure Mother   . Asthma Sister   . Anxiety disorder Sister   . Depression Sister   . Hypertension Father   . Asthma Daughter   . Bipolar disorder Daughter   . Cancer Maternal Uncle        Ericson  . Cancer Other        ovarian  . Liver cancer Paternal Grandmother        ????   Allergies  Allergen Reactions  . Amoxicillin-Pot Clavulanate Nausea And Vomiting    Projectile vomiting Did it involve swelling of the face/tongue/throat, SOB, or low BP? No Did it involve sudden or severe rash/hives, skin peeling, or any reaction on the inside of your mouth or nose? No Did you need to seek medical attention at a hospital or doctor's office? No When did it last happen?20-30 years If all above answers are "NO", may proceed with cephalosporin use.   . Adhesive [Tape] Other (See Comments)    Tears skin off - use paper tape   . Codeine Other (See Comments)    hallucinations  . Crestor [Rosuvastatin Calcium]     Severe muscle cramps  . Lactose Intolerance (Gi) Nausea And Vomiting  . Morphine And Related Nausea And Vomiting and Other (See Comments)    Migraine headaches  . Vicodin [Hydrocodone-Acetaminophen]  Other (See Comments)    hallucinations  . Latex Rash   Prior to Admission medications   Medication Sig Start Date End Date Taking? Authorizing Provider  acetaminophen (TYLENOL) 325 MG tablet Take 650 mg by mouth every 6 (six) hours as needed for headache (pain).    [provider]  clonazePAM (KLONOPIN) 1 MG tablet TAKE 1 TABLET BY MOUTH 2 TIMES A DAY AS NEEDED FOR ANXIETY 05/26/20   Biagio Borg, MD  cyclobenzaprine (FLEXERIL) 10 MG tablet TAKE 1 TABLET BY MOUTH 2 TIMES A DAY AS NEEDED FOR MUSCLE SPASMS Patient taking differently: Take 10 mg by mouth 2 (two) times daily as needed for muscle spasms.  07/26/19  Biagio Borg, MD  furosemide (LASIX) 40 MG tablet Take 1 tablet (40 mg total) by mouth daily. 01/31/20   Biagio Borg, MD  losartan (COZAAR) 100 MG tablet Take 1 tablet (100 mg total) by mouth daily. 05/23/19   Biagio Borg, MD  metroNIDAZOLE (METROCREAM) 0.75 % cream Apply topically 2 (two) times daily. 01/31/20   Biagio Borg, MD  NARCAN 4 MG/0.1ML LIQD nasal spray kit Place 0.4 mg into the nose daily as needed (opioid overdose).  04/25/19   [provider]  ondansetron (ZOFRAN ODT) 4 MG disintegrating tablet Take 1 tablet (4 mg total) by mouth every 4 (four) hours as needed for nausea or vomiting. 08/23/19   Charlesetta Shanks, MD  Oxycodone HCl 10 MG TABS Take 1 tablet (10 mg total) 4 (four) times daily by mouth. Per [facility] Patient taking differently: Take 10 mg by mouth every 4 (four) hours as needed (pain). Per [facility] 11/08/17   Biagio Borg, MD  pantoprazole (PROTONIX) 40 MG tablet Take 1 tablet (40 mg total) by mouth daily. 07/26/19   Biagio Borg, MD  potassium chloride (K-DUR) 10 MEQ tablet TAKE 1 TABLET BY MOUTH DAILY Patient taking differently: Take 10 mEq by mouth daily as needed (cramps).  12/28/18   Biagio Borg, MD  rOPINIRole (REQUIP) 3 MG tablet Take 3 mg by mouth at bedtime as needed (restless legs).    [provider]   sertraline (ZOLOFT) 100 MG tablet TAKE 1 & ONE-HALF TABLET BY MOUTH EVERY DAY 03/18/20   Biagio Borg, MD  Tetrahydrozoline HCl (VISINE OP) Place 1 drop into both eyes daily as needed (dry eyes).     [provider]  triamcinolone (NASACORT) 55 MCG/ACT AERO nasal inhaler Place 2 sprays into the nose daily. Patient taking differently: Place 2 sprays into the nose daily as needed (sneezing/runny nose).  06/13/18   Biagio Borg, MD  vitamin B-12 (CYANOCOBALAMIN) 1000 MCG tablet Take 1 tablet (1,000 mcg total) by mouth daily. 02/05/20   Biagio Borg, MD  Vitamin D, Ergocalciferol, (DRISDOL) 1.25 MG (50000 UNIT) CAPS capsule Take 1 capsule (50,000 Units total) by mouth every 7 (seven) days. 02/05/20   Biagio Borg, MD  DULoxetine (CYMBALTA) 60 MG capsule Take 60 mg by mouth daily.    03/09/12  [provider]  fexofenadine (ALLEGRA) 180 MG tablet Take 180 mg by mouth daily.    03/09/12  [provider]     Positive ROS: Otherwise negative  All other systems have been reviewed and were otherwise negative with the exception of those mentioned in the HPI and as above.  Physical Exam: Constitutional: Alert, well-appearing, no acute distress.  Her voice sounds good in the office today with no significant hoarseness. Ears: External ears without lesions or tenderness. Ear canals are clear bilaterally with intact, clear TMs.  On hearing screening with the 512 1024 tuning fork she hears well in both ears and was symmetric.  She described hearing a whooshing sound in her left ear but on auscultation I cannot appreciate any pulsatile tinnitus in the ear or the neck.. Nasal: External nose without lesions. Septum midline.. Clear nasal passages bilaterally with no signs of infection. Oral: Lips and gums without lesions. Tongue and palate mucosa without lesions. Posterior oropharynx clear. Fiberoptic laryngoscopy was performed to the left nostril.  The nasopharynx was clear.  The base of  tongue vallecula epiglottis were normal.  On evaluation of the  vocal cords for cords were clear bilaterally with normal vocal cord mobility bilaterally. Neck: No palpable adenopathy or masses.  The nodule she feels in her neck is located just above the previous incision from her anterior cervical fusion and on my examination feels to represent more scar tissue the any discrete thyroid nodule or palpable adenopathy.  No supraclavicular adenopathy noted. Respiratory: Breathing comfortably  Skin: No facial/neck lesions or rash noted.  Laryngoscopy  Date/Time: 05/30/2020 3:08 PM Performed by: Rozetta Nunnery, MD Authorized by: Rozetta Nunnery, MD   Consent:    Consent obtained:  Verbal   Consent given by:  Patient Procedure details:    Indications: hoarseness, dysphagia, or aspiration     Medication:  Afrin   Instrument: flexible fiberoptic laryngoscope     Scope location: left nare   Sinus:    Left nasopharynx: normal   Mouth:    Oropharynx: normal     Vallecula: normal     Base of tongue: normal     Epiglottis: normal   Throat:    True vocal cords: normal   Comments:     Vocal cords are clear bilaterally with normal vocal mobility bilaterally.    Assessment: History of hoarseness with normal laryngeal examination. I discussed with her that the nodule she feels in her neck most likely represents scar tissue and that I do not appreciate any palpable adenopathy or thyroid masses.  Likewise the CT scan was normal. Dizziness questionable etiology but does not appear to be vestibular or inner ear related.  Plan: Reassured Stephanie Coup of normal neck examination as well as normal laryngeal examination.  There is no evidence of thyroid cancer or neoplasia.   Radene Journey, MD   CC:

## 2020-05-31 NOTE — Telephone Encounter (Signed)
[facility] report : ---Caller states for the past week she has been having dizzy spells. When she turns head she can hear a "whoosh" and feels dizzy. Symptoms started soon after she stopped taking Flexeril daily. Saw ENT today and said she does not have vertigo, "ears are good". Patient states she weaned off of Zoloft 2-3 weeks ago and wonders if it could be a result of stopping meds. The dizziness usually occurs in afternoon, improves after drinking "sleepy time Tea"  Called and LVM for patient to call back and set up an appointment with Dr.[name] for the issue.

## 2020-06-05 ENCOUNTER — Encounter: Payer: Self-pay | Admitting: Internal Medicine

## 2020-06-05 ENCOUNTER — Telehealth: Payer: Medicare Other | Admitting: Internal Medicine

## 2020-06-05 DIAGNOSIS — E538 Deficiency of other specified B group vitamins: Secondary | ICD-10-CM

## 2020-06-05 NOTE — Patient Instructions (Signed)
Not seen

## 2020-06-05 NOTE — Progress Notes (Signed)
Patient ID: Maria [name], female   DOB: [birth year], 64 y.o.   MRN: [id number]  Virtual Visit via Video Note  Unable to connect and no answer by phone

## 2020-06-07 ENCOUNTER — Encounter (HOSPITAL_COMMUNITY): Payer: Self-pay | Admitting: *Deleted

## 2020-06-07 ENCOUNTER — Emergency Department (HOSPITAL_COMMUNITY)
Admission: EM | Admit: 2020-06-07 | Discharge: 2020-06-07 | Disposition: A | Discharge status: Home / Self Care | Payer: Medicare Other | Attending: Emergency Medicine | Admitting: Emergency Medicine

## 2020-06-07 ENCOUNTER — Other Ambulatory Visit: Payer: Self-pay

## 2020-06-07 ENCOUNTER — Telehealth: Payer: Self-pay

## 2020-06-07 ENCOUNTER — Emergency Department (HOSPITAL_COMMUNITY): Payer: Medicare Other

## 2020-06-07 DIAGNOSIS — Y93E1 Activity, personal bathing and showering: Secondary | ICD-10-CM | POA: Diagnosis not present

## 2020-06-07 DIAGNOSIS — Z91040 Latex allergy status: Secondary | ICD-10-CM | POA: Diagnosis not present

## 2020-06-07 DIAGNOSIS — R0902 Hypoxemia: Secondary | ICD-10-CM | POA: Diagnosis not present

## 2020-06-07 DIAGNOSIS — Z87891 Personal history of nicotine dependence: Secondary | ICD-10-CM | POA: Insufficient documentation

## 2020-06-07 DIAGNOSIS — S40012A Contusion of left shoulder, initial encounter: Secondary | ICD-10-CM | POA: Diagnosis not present

## 2020-06-07 DIAGNOSIS — Y999 Unspecified external cause status: Secondary | ICD-10-CM | POA: Insufficient documentation

## 2020-06-07 DIAGNOSIS — I10 Essential (primary) hypertension: Secondary | ICD-10-CM | POA: Diagnosis not present

## 2020-06-07 DIAGNOSIS — Y929 Unspecified place or not applicable: Secondary | ICD-10-CM | POA: Diagnosis not present

## 2020-06-07 DIAGNOSIS — S8002XA Contusion of left knee, initial encounter: Secondary | ICD-10-CM | POA: Diagnosis not present

## 2020-06-07 DIAGNOSIS — S4992XA Unspecified injury of left shoulder and upper arm, initial encounter: Secondary | ICD-10-CM | POA: Diagnosis not present

## 2020-06-07 DIAGNOSIS — Z743 Need for continuous supervision: Secondary | ICD-10-CM | POA: Diagnosis not present

## 2020-06-07 DIAGNOSIS — W1839XA Other fall on same level, initial encounter: Secondary | ICD-10-CM | POA: Diagnosis not present

## 2020-06-07 DIAGNOSIS — S8992XA Unspecified injury of left lower leg, initial encounter: Secondary | ICD-10-CM | POA: Diagnosis not present

## 2020-06-07 DIAGNOSIS — S40912A Unspecified superficial injury of left shoulder, initial encounter: Secondary | ICD-10-CM | POA: Diagnosis present

## 2020-06-07 DIAGNOSIS — R6889 Other general symptoms and signs: Secondary | ICD-10-CM | POA: Diagnosis not present

## 2020-06-07 DIAGNOSIS — W19XXXA Unspecified fall, initial encounter: Secondary | ICD-10-CM

## 2020-06-07 DIAGNOSIS — R404 Transient alteration of awareness: Secondary | ICD-10-CM | POA: Diagnosis not present

## 2020-06-07 DIAGNOSIS — M549 Dorsalgia, unspecified: Secondary | ICD-10-CM | POA: Diagnosis not present

## 2020-06-07 LAB — CBC WITH DIFFERENTIAL/PLATELET
Abs Immature Granulocytes: 0.03 10*3/uL (ref 0.00–0.07)
Basophils Absolute: 0.1 10*3/uL (ref 0.0–0.1)
Basophils Relative: 1 %
Eosinophils Absolute: 0.2 10*3/uL (ref 0.0–0.5)
Eosinophils Relative: 2 %
HCT: 44.8 % (ref 36.0–46.0)
Hemoglobin: 14.4 g/dL (ref 12.0–15.0)
Immature Granulocytes: 0 %
Lymphocytes Relative: 37 %
Lymphs Abs: 3.5 10*3/uL (ref 0.7–4.0)
MCH: 29.6 pg (ref 26.0–34.0)
MCHC: 32.1 g/dL (ref 30.0–36.0)
MCV: 92.2 fL (ref 80.0–100.0)
Monocytes Absolute: 0.5 10*3/uL (ref 0.1–1.0)
Monocytes Relative: 6 %
Neutro Abs: 5.1 10*3/uL (ref 1.7–7.7)
Neutrophils Relative %: 54 %
Platelets: 224 10*3/uL (ref 150–400)
RBC: 4.86 MIL/uL (ref 3.87–5.11)
RDW: 13.2 % (ref 11.5–15.5)
WBC: 9.3 10*3/uL (ref 4.0–10.5)
nRBC: 0 % (ref 0.0–0.2)

## 2020-06-07 LAB — BASIC METABOLIC PANEL
Anion gap: 13 (ref 5–15)
BUN: 16 mg/dL (ref 8–23)
CO2: 28 mmol/L (ref 22–32)
Calcium: 9.1 mg/dL (ref 8.9–10.3)
Chloride: 97 mmol/L — ABNORMAL LOW (ref 98–111)
Creatinine, Ser: 1.33 mg/dL — ABNORMAL HIGH (ref 0.44–1.00)
GFR calc Af Amer: 49 mL/min — ABNORMAL LOW (ref >60)
GFR calc non Af Amer: 42 mL/min — ABNORMAL LOW (ref >60)
Glucose, Bld: 99 mg/dL (ref 70–99)
Potassium: 4.1 mmol/L (ref 3.5–5.1)
Sodium: 138 mmol/L (ref 135–145)

## 2020-06-07 MED ORDER — OXYCODONE HCL 5 MG PO TABS
10.0000 mg | ORAL_TABLET | Freq: Once | ORAL | Status: AC
  Administered 2020-06-07: 10 mg via ORAL
  Filled 2020-06-07: qty 2

## 2020-06-07 NOTE — Telephone Encounter (Signed)
New message:    Pt called and states she has cramping all over and states she feel when getting out of the shower and hit her head. I have transferred the call to [facility]. Please advise.

## 2020-06-07 NOTE — Telephone Encounter (Signed)
Patient spoke with [facility] on  06/07/2020 2:03:52 PM and caller states she fell and hit her head as she was getting out of the shower. States everything hurts. Hit the left side of her head. Pt. has been having dizzy spells for a couple of weeks. Still a little dizzy. No LOC. Able to walk, but hurts badly.  Advised to go to ED now.

## 2020-06-07 NOTE — ED Provider Notes (Signed)
[facility] DEPT Provider Note   CSN: [id number] Arrival date & time: 06/07/20  1532     History Chief Complaint  Patient presents with   Maria [name] is a 64 y.o. female.  She has a history of chronic back pain.  She said her doctor recently weaned her off of sertraline.  She is felt dizzy lightheaded on and off for a week or 2.  Today she was getting out of the shower and fell injuring her left shoulder and left knee.  Worsening of her chronic back pain.  No numbness or weakness.  No head injury or loss of consciousness.  No chest pain or abdominal pain.  Has tried no medication for her symptoms.  The history is provided by the patient.  Fall This is a new problem. The current episode started 3 to 5 hours ago. The problem occurs rarely. The problem has not changed since onset.Pertinent negatives include no chest pain, no abdominal pain, no headaches and no shortness of breath. The symptoms are aggravated by bending, twisting and standing. Nothing relieves the symptoms. She has tried nothing for the symptoms. The treatment provided no relief.       Past Medical History:  Diagnosis Date   Acute lymphadenitis 2011   ALLERGIC RHINITIS 08/10/2007   ANXIETY 12/06/2007   Atherosclerotic peripheral vascular disease ([place]) 06/13/2013   Aorta on CT June 2014   BACK PAIN 12/06/2007   Cellulitis 2011   Cervical disc disease 03/09/2012   Chronic pain 03/09/2012   DEPRESSION 12/06/2007   DIVERTICULOSIS, [place] 12/06/2007   GERD 12/06/2007   Hepatitis    age 43 hepatitis A   HLD (hyperlipidemia) 05/24/2019   HYPERTENSION 12/06/2007   Impaired glucose tolerance 03/13/2012   LOW BACK PAIN 12/06/2007   Lumbar disc disease 03/09/2012   Mesenteric adenitis    MRSA 2006   Pain    by breast area   Pain in joint, multiple sites 04/21/2010   PARONYCHIA, FINGER 08/13/2009   Pneumonia sept 2015   walking pneumonia, sepsis     RASH-NONVESICULAR 05/09/2008   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   SHOULDER PAIN, LEFT 05/09/2008   SINUSITIS- ACUTE-NOS 05/09/2008   THORACIC/LUMBOSACRAL NEURITIS/RADICULITIS UNSPEC 12/28/2008    Patient Active Problem List   Diagnosis Date Noted   Hoarseness 05/01/2020   Vitamin B 12 deficiency 01/31/2020   Rosacea 01/31/2020   Neck swelling 01/31/2020   History of colonic polyps    Benign neoplasm of [place]    Nausea 08/31/2019   Throat pain 07/26/2019   HLD (hyperlipidemia) 05/24/2019   Leg cramps 05/24/2019   Lump in neck 05/24/2019   Hyperglycemia 01/25/2019   Left thyroid nodule 01/25/2019   Jerking movements of extremities 07/20/2018   Balance disorder 07/20/2018   Allergic rhinitis 06/15/2018   Right knee pain 03/21/2018   OSA (obstructive sleep apnea) 03/21/2018   Oxygen desaturation 03/21/2018   Urinary symptom or sign 03/21/2018   Acquired lymphedema 01/25/2018   Gait disorder 01/25/2018   Peripheral edema 01/16/2018   SOB (shortness of breath) 01/15/2018   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   Encounter for well adult exam with abnormal findings 11/08/2017   Abdominal pain, epigastric    Dysphagia    [place] cancer screening    Adverse effect of angiotensin-converting enzyme inhibitor 12/05/2015   Restless leg 10/08/2015   Dermatitis, stasis 04/26/2015   Fibromyalgia 01/31/2015   Acid reflux 10/17/2014   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/17/2014  Post-traumatic osteoarthritis of both knees 09/19/2014   Spinal stenosis 09/19/2014   Cervical disc disease 03/09/2012   Lumbar disc disease 03/09/2012   Chronic pain 03/09/2012   Obesity 06/17/2011   Chronic low back pain 12/28/2008   Anxiety state 12/06/2007   Depression 12/06/2007   Essential hypertension 12/06/2007   LOW BACK PAIN 12/06/2007    Past Surgical History:  Procedure Laterality Date   ABDOMINAL HYSTERECTOMY  1999   1 ovary left    bloo clot removed from neck   may 25th , june 2. 2010   [place]  may 24th 2010   COLONOSCOPY WITH PROPOFOL N/A 07/10/2016   Procedure: COLONOSCOPY WITH PROPOFOL;  Surgeon: Manus Gunning, MD;  Location: [facility];  Service: Gastroenterology;  Laterality: N/A;   COLONOSCOPY WITH PROPOFOL N/A 09/26/2019   Procedure: COLONOSCOPY WITH PROPOFOL;  Surgeon: Yetta Flock, MD;  Location: [facility];  Service: Gastroenterology;  Laterality: N/A;   ESOPHAGOGASTRODUODENOSCOPY (EGD) WITH PROPOFOL N/A 07/10/2016   Procedure: ESOPHAGOGASTRODUODENOSCOPY (EGD) WITH PROPOFOL;  Surgeon: Manus Gunning, MD;  Location: [facility];  Service: Gastroenterology;  Laterality: N/A;   POLYPECTOMY  09/26/2019   Procedure: POLYPECTOMY;  Surgeon: Yetta Flock, MD;  Location: [facility];  Service: Gastroenterology;;   s/p ovary cyst     s/p right knee arthroscopy     Dr. Mardelle Matte ortho     OB History   No obstetric history on file.     Family History  Problem Relation Age of Onset   Heart disease Mother    Hypertension Mother    Diabetes Mother    Heart failure Mother    Asthma Sister    Anxiety disorder Sister    Depression Sister    Hypertension Father    Asthma Daughter    Bipolar disorder Daughter    Cancer Maternal Uncle        [place]   Cancer Other        ovarian   Liver cancer Paternal Grandmother        ????    Social History   Tobacco Use   Smoking status: Former Smoker    Years: 30.00    Quit date: 11/08/2008    Years since quitting: 11.5   Smokeless tobacco: Never Used   Tobacco comment: quit 10/09  Vaping Use   Vaping Use: Never used  Substance Use Topics   Alcohol use: No    Comment: quit drinking 10/07/1997   Drug use: No    Home Medications Prior to Admission medications   Medication Sig Start Date End Date Taking? Authorizing Provider  acetaminophen (TYLENOL) 325 MG tablet Take 650 mg by mouth  every 6 (six) hours as needed for headache (pain).    [provider]  clonazePAM (KLONOPIN) 1 MG tablet TAKE 1 TABLET BY MOUTH 2 TIMES A DAY AS NEEDED FOR ANXIETY 05/26/20   Biagio Borg, MD  cyclobenzaprine (FLEXERIL) 10 MG tablet TAKE 1 TABLET BY MOUTH 2 TIMES A DAY AS NEEDED FOR MUSCLE SPASMS Patient taking differently: Take 10 mg by mouth 2 (two) times daily as needed for muscle spasms.  07/26/19   Biagio Borg, MD  furosemide (LASIX) 40 MG tablet Take 1 tablet (40 mg total) by mouth daily. 01/31/20   Biagio Borg, MD  losartan (COZAAR) 100 MG tablet Take 1 tablet (100 mg total) by mouth daily. 05/23/19   Biagio Borg, MD  metroNIDAZOLE (METROCREAM) 0.75 % cream Apply topically 2 (  two) times daily. 01/31/20   Biagio Borg, MD  NARCAN 4 MG/0.1ML LIQD nasal spray kit Place 0.4 mg into the nose daily as needed (opioid overdose).  04/25/19   [provider]  ondansetron (ZOFRAN ODT) 4 MG disintegrating tablet Take 1 tablet (4 mg total) by mouth every 4 (four) hours as needed for nausea or vomiting. 08/23/19   Charlesetta Shanks, MD  Oxycodone HCl 10 MG TABS Take 1 tablet (10 mg total) 4 (four) times daily by mouth. Per [facility] Patient taking differently: Take 10 mg by mouth every 4 (four) hours as needed (pain). Per [facility] 11/08/17   Biagio Borg, MD  pantoprazole (PROTONIX) 40 MG tablet Take 1 tablet (40 mg total) by mouth daily. 07/26/19   Biagio Borg, MD  potassium chloride (K-DUR) 10 MEQ tablet TAKE 1 TABLET BY MOUTH DAILY Patient taking differently: Take 10 mEq by mouth daily as needed (cramps).  12/28/18   Biagio Borg, MD  rOPINIRole (REQUIP) 3 MG tablet Take 3 mg by mouth at bedtime as needed (restless legs).    [provider]  sertraline (ZOLOFT) 100 MG tablet TAKE 1 & ONE-HALF TABLET BY MOUTH EVERY DAY 03/18/20   Biagio Borg, MD  Tetrahydrozoline HCl (VISINE OP) Place 1 drop into both eyes daily as needed (dry eyes).     [provider]  triamcinolone (NASACORT) 55 MCG/ACT AERO nasal inhaler Place 2 sprays into the nose daily. Patient taking differently: Place 2 sprays into the nose daily as needed (sneezing/runny nose).  06/13/18   Biagio Borg, MD  vitamin B-12 (CYANOCOBALAMIN) 1000 MCG tablet Take 1 tablet (1,000 mcg total) by mouth daily. 02/05/20   Biagio Borg, MD  Vitamin D, Ergocalciferol, (DRISDOL) 1.25 MG (50000 UNIT) CAPS capsule Take 1 capsule (50,000 Units total) by mouth every 7 (seven) days. 02/05/20   Biagio Borg, MD  DULoxetine (CYMBALTA) 60 MG capsule Take 60 mg by mouth daily.    03/09/12  [provider]  fexofenadine (ALLEGRA) 180 MG tablet Take 180 mg by mouth daily.    03/09/12  [provider]    Allergies    Amoxicillin-pot clavulanate, Adhesive [tape], Codeine, Crestor [rosuvastatin calcium], Lactose intolerance (gi), Morphine and related, Vicodin [hydrocodone-acetaminophen], and Latex  Review of Systems   Review of Systems  Constitutional: Negative for fever.  HENT: Negative for sore throat.   Eyes: Negative for visual disturbance.  Respiratory: Negative for shortness of breath.   Cardiovascular: Negative for chest pain.  Gastrointestinal: Negative for abdominal pain.  Genitourinary: Negative for dysuria.  Musculoskeletal: Positive for back pain and gait problem.  Skin: Negative for rash and wound.  Neurological: Positive for light-headedness. Negative for headaches.    Physical Exam Updated Vital Signs BP (!) 167/59    Pulse 78    Temp 98.3 F (36.8 C) (Oral)    Resp 16    Ht _0  (1.651 m)    Wt (!) 150.6 kg    SpO2 100%    BMI 55.25 kg/m   Physical Exam Vitals and nursing note reviewed.  Constitutional:      General: She is not in acute distress.    Appearance: Normal appearance. She is well-developed. She is obese.  HENT:     Head: Normocephalic and atraumatic.  Eyes:     Conjunctiva/sclera: Conjunctivae normal.  Cardiovascular:     Rate and Rhythm: Normal  rate and regular rhythm.  Heart sounds: No murmur heard.   Pulmonary:     Effort: Pulmonary effort is normal. No respiratory distress.     Breath sounds: Normal breath sounds.  Abdominal:     Palpations: Abdomen is soft.     Tenderness: There is no abdominal tenderness. There is no guarding or rebound.  Musculoskeletal:        General: Tenderness present. No deformity. Normal range of motion.     Cervical back: Neck supple.     Comments: She has some diffuse tenderness around her left shoulder and left knee.  Diffuse Lumbar tenderness.  Pain with range of motion but no limitations.  No other joint pain noted.  No overlying bruising or swelling.  No open wounds.  Skin:    General: Skin is warm and dry.     Capillary Refill: Capillary refill takes less than 2 seconds.  Neurological:     General: No focal deficit present.     Mental Status: She is alert and oriented to person, place, and time.     Sensory: No sensory deficit.     Motor: No weakness.     Gait: Gait abnormal (antalgic).     ED Results / Procedures / Treatments   Labs (all labs ordered are listed, but only abnormal results are displayed) Labs Reviewed  BASIC METABOLIC PANEL - Abnormal; Notable for the following components:      Result Value   Chloride 97 (*)    Creatinine, Ser 1.33 (*)    GFR calc non Af Amer 42 (*)    GFR calc Af Amer 49 (*)    All other components within normal limits  CBC WITH DIFFERENTIAL/PLATELET    EKG None  Radiology DG Shoulder Left  Result Date: 06/07/2020 CLINICAL DATA:  Status post fall. EXAM: LEFT SHOULDER - 2+ VIEW COMPARISON:  None. FINDINGS: There is no evidence of fracture or dislocation. There is no evidence of arthropathy or other focal bone abnormality. Soft tissues are unremarkable. IMPRESSION: Negative. Electronically Signed   By: Virgina Norfolk M.D.   On: 06/07/2020 20:28   DG Knee Complete 4 Views Left  Result Date: 06/07/2020 CLINICAL DATA:  Status post fall.  EXAM: LEFT KNEE - COMPLETE 4+ VIEW COMPARISON:  None. FINDINGS: No evidence of fracture, dislocation, or joint effusion. There is mild narrowing of the medial tibiofemoral compartment space. Soft tissues are unremarkable. IMPRESSION: Mild degenerative changes in the left knee. Electronically Signed   By: Virgina Norfolk M.D.   On: 06/07/2020 20:29    Procedures Procedures (including critical care time)  Medications Ordered in ED Medications  oxyCODONE (Oxy IR/ROXICODONE) immediate release tablet 10 mg (has no administration in time range)    ED Course  I have reviewed the triage vital signs and the nursing notes.  Pertinent labs & imaging results that were available during my care of the patient were reviewed by me and considered in my medical decision making (see chart for details).    MDM Rules/Calculators/A&P                         This patient complains of pain of left shoulder left knee after a fall.; this involves an extensive number of treatment Options and is a complaint that carries with it a high risk of complications and Morbidity. The differential includes contusion, fracture, dislocation, metabolic derangement, anemia  I ordered, reviewed and interpreted labs, which included CBC normal white count normal hemoglobin.  Chemistries  normal potassium slightly elevated creatinine from baseline. I ordered medication oral oxycodone with improvement in her pain. I ordered imaging studies which included x-rays of left shoulder and knee and I independently    visualized and interpreted imaging which show some degenerative changes but no acute fracture or dislocation  Previous records obtained and reviewed in epic  After the interventions stated above, I reevaluated the patient and found the patient's pain to be improved.  She is ambulated in the department and was able to transfer from wheelchair to bed.  Recommended close follow-up with her primary care doctor and her pain  management specialist.  Return instructions discussed.  Also recommended to increase fluid intake.   Final Clinical Impression(s) / ED Diagnoses Final diagnoses:  Fall, initial encounter  Contusion of left shoulder, initial encounter  Contusion of left knee, initial encounter    Rx / DC Orders ED Discharge Orders    None       Hayden Rasmussen, MD 06/08/20 1049

## 2020-06-07 NOTE — Discharge Instructions (Addendum)
You were seen in the emergency department for evaluation of injuries from a fall.  You had x-rays of your left shoulder and left knee that did not show any obvious abnormalities.  You also had some blood work done that was mostly normal other than a mildly elevated creatinine.  Please drink more fluids.  Follow-up with your doctor for reevaluation.  Return to the emergency department for any worsening or concerning symptoms.

## 2020-06-07 NOTE — ED Triage Notes (Addendum)
BIB EMS pt fell today, pain in left knee and shoulder, Left knee pain, while drying off after shower. No LOC. [id number] 94% CBG 135   Pt ambulated to ambulance

## 2020-06-07 NOTE — ED Notes (Signed)
Pt ambulated to restroom and back with cane. No assistance from staff

## 2020-06-09 ENCOUNTER — Other Ambulatory Visit
Admission: RE | Admit: 2020-06-09 | Discharge: 2020-06-09 | Disposition: A | Discharge status: Home / Self Care | Payer: Medicare Other | Source: Ambulatory Visit | Attending: Internal Medicine | Admitting: Internal Medicine

## 2020-06-09 DIAGNOSIS — Z7689 Persons encountering health services in other specified circumstances: Secondary | ICD-10-CM | POA: Diagnosis not present

## 2020-06-09 LAB — BASIC METABOLIC PANEL
Anion gap: 10 (ref 5–15)
BUN: 17 mg/dL (ref 8–23)
CO2: 27 mmol/L (ref 22–32)
Calcium: 8.8 mg/dL — ABNORMAL LOW (ref 8.9–10.3)
Chloride: 98 mmol/L (ref 98–111)
Creatinine, Ser: 1.07 mg/dL — ABNORMAL HIGH (ref 0.44–1.00)
GFR calc Af Amer: >60 mL/min (ref >60)
GFR calc non Af Amer: 55 mL/min — ABNORMAL LOW (ref >60)
Glucose, Bld: 141 mg/dL — ABNORMAL HIGH (ref 70–99)
Potassium: 4.4 mmol/L (ref 3.5–5.1)
Sodium: 135 mmol/L (ref 135–145)

## 2020-06-09 LAB — MAGNESIUM: Magnesium: 2.1 mg/dL (ref 1.7–2.4)

## 2020-06-11 NOTE — Telephone Encounter (Signed)
Pt went to the ED on 06/07/2020. **LVM for pt to call the clinic and schedule an apptmnt to see Dr. [name] Reichmann asap to follow up on her sxs and her fall.

## 2020-06-12 ENCOUNTER — Ambulatory Visit (INDEPENDENT_AMBULATORY_CARE_PROVIDER_SITE_OTHER): Payer: Medicare Other | Admitting: Psychiatry

## 2020-06-12 ENCOUNTER — Other Ambulatory Visit: Payer: Self-pay

## 2020-06-12 DIAGNOSIS — I10 Essential (primary) hypertension: Secondary | ICD-10-CM | POA: Diagnosis not present

## 2020-06-12 DIAGNOSIS — E785 Hyperlipidemia, unspecified: Secondary | ICD-10-CM | POA: Diagnosis not present

## 2020-06-12 DIAGNOSIS — F4310 Post-traumatic stress disorder, unspecified: Secondary | ICD-10-CM

## 2020-06-12 DIAGNOSIS — M1990 Unspecified osteoarthritis, unspecified site: Secondary | ICD-10-CM | POA: Insufficient documentation
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
## 2020-06-14 DIAGNOSIS — M545 Low back pain: Secondary | ICD-10-CM | POA: Diagnosis not present

## 2020-06-14 DIAGNOSIS — G8929 Other chronic pain: Secondary | ICD-10-CM | POA: Diagnosis not present

## 2020-06-14 DIAGNOSIS — M79661 Pain in right lower leg: Secondary | ICD-10-CM | POA: Diagnosis not present

## 2020-06-14 DIAGNOSIS — Z79891 Long term (current) use of opiate analgesic: Secondary | ICD-10-CM | POA: Diagnosis not present

## 2020-06-14 DIAGNOSIS — G894 Chronic pain syndrome: Secondary | ICD-10-CM | POA: Diagnosis not present

## 2020-06-14 DIAGNOSIS — M25551 Pain in right hip: Secondary | ICD-10-CM | POA: Diagnosis not present

## 2020-06-14 DIAGNOSIS — M25512 Pain in left shoulder: Secondary | ICD-10-CM | POA: Diagnosis not present

## 2020-06-25 DIAGNOSIS — R739 Hyperglycemia, unspecified: Secondary | ICD-10-CM | POA: Diagnosis not present

## 2020-06-25 DIAGNOSIS — I700 Atherosclerosis of aorta: Secondary | ICD-10-CM | POA: Diagnosis not present

## 2020-06-25 DIAGNOSIS — I10 Essential (primary) hypertension: Secondary | ICD-10-CM | POA: Diagnosis not present

## 2020-06-26 ENCOUNTER — Ambulatory Visit (INDEPENDENT_AMBULATORY_CARE_PROVIDER_SITE_OTHER): Payer: Medicare Other | Admitting: Psychiatry

## 2020-06-26 ENCOUNTER — Other Ambulatory Visit: Payer: Self-pay | Admitting: Internal Medicine

## 2020-06-26 ENCOUNTER — Other Ambulatory Visit: Payer: Self-pay

## 2020-06-26 ENCOUNTER — Telehealth (HOSPITAL_COMMUNITY): Payer: Self-pay | Admitting: *Deleted

## 2020-06-26 DIAGNOSIS — F4310 Post-traumatic stress disorder, unspecified: Secondary | ICD-10-CM

## 2020-06-26 NOTE — Telephone Encounter (Signed)
LMOM for patient to call office to schedule 2 appts 2 wks apart with Vickii Chafe

## 2020-06-26 NOTE — Telephone Encounter (Signed)
Please refill as per office routine med refill policy (all routine meds refilled for 3 mo or monthly per pt preference up to one year from last visit, then month to month grace period for 3 mo, then further med refills will have to be denied)  
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
## 2020-07-08 ENCOUNTER — Other Ambulatory Visit: Payer: Self-pay | Admitting: Internal Medicine

## 2020-07-12 ENCOUNTER — Ambulatory Visit (HOSPITAL_COMMUNITY): Payer: Medicare Other | Admitting: Psychiatry

## 2020-07-12 ENCOUNTER — Other Ambulatory Visit: Payer: Self-pay

## 2020-07-12 DIAGNOSIS — M25512 Pain in left shoulder: Secondary | ICD-10-CM | POA: Diagnosis not present

## 2020-07-12 DIAGNOSIS — M25551 Pain in right hip: Secondary | ICD-10-CM | POA: Diagnosis not present

## 2020-07-12 DIAGNOSIS — G894 Chronic pain syndrome: Secondary | ICD-10-CM | POA: Diagnosis not present

## 2020-07-12 DIAGNOSIS — Z79891 Long term (current) use of opiate analgesic: Secondary | ICD-10-CM | POA: Diagnosis not present

## 2020-07-12 DIAGNOSIS — M79661 Pain in right lower leg: Secondary | ICD-10-CM | POA: Diagnosis not present

## 2020-07-12 DIAGNOSIS — G8929 Other chronic pain: Secondary | ICD-10-CM | POA: Diagnosis not present

## 2020-07-12 DIAGNOSIS — M545 Low back pain: Secondary | ICD-10-CM | POA: Diagnosis not present

## 2020-07-25 ENCOUNTER — Other Ambulatory Visit: Payer: Self-pay

## 2020-07-25 ENCOUNTER — Ambulatory Visit (INDEPENDENT_AMBULATORY_CARE_PROVIDER_SITE_OTHER): Payer: Medicare Other | Admitting: Psychiatry

## 2020-07-25 DIAGNOSIS — F4310 Post-traumatic stress disorder, unspecified: Secondary | ICD-10-CM

## 2020-07-25 DIAGNOSIS — Z1159 Encounter for screening for other viral diseases: Secondary | ICD-10-CM | POA: Diagnosis not present
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
## 2020-07-31 ENCOUNTER — Telehealth (HOSPITAL_COMMUNITY): Payer: Self-pay | Admitting: Psychiatry

## 2020-07-31 NOTE — Telephone Encounter (Signed)
Left detailed message advising appt canceled due to provider being out of the office, requested pt to return call to reschedule

## 2020-08-05 ENCOUNTER — Telehealth (INDEPENDENT_AMBULATORY_CARE_PROVIDER_SITE_OTHER): Payer: Medicare Other | Admitting: Internal Medicine

## 2020-08-05 ENCOUNTER — Encounter: Payer: Self-pay | Admitting: Internal Medicine

## 2020-08-05 ENCOUNTER — Other Ambulatory Visit: Payer: Self-pay | Admitting: Internal Medicine

## 2020-08-05 DIAGNOSIS — K5900 Constipation, unspecified: Secondary | ICD-10-CM | POA: Insufficient documentation

## 2020-08-05 NOTE — Assessment & Plan Note (Addendum)
Suspect her chronic opioids are playing a role although she is not sure about diet changes recently. Encouraged plenty of fluids, taking dulcolax which she has at home BID for 4-5 days for emptying. If still having any abdominal pain or problems please come to office for in person visit and assessment. Does not have any symptoms of obstruction today. If N/V or stops passing gas advised to go to ER. Last colonoscopy 2020 and due again in 2023-2025 (3-5 yr follow up recommended).

## 2020-08-05 NOTE — Progress Notes (Signed)
Virtual Visit via Audio Note  I connected with Maria [name] on 08/05/20 at 10:00 AM EDT by an audio-only enabled telemedicine application and verified that I am speaking with the correct person using two identifiers.  The patient and the provider were at separate locations throughout the entire encounter. Patient location: home, Provider location: work   I discussed the limitations of evaluation and management by telemedicine and the availability of in person appointments. The patient expressed understanding and agreed to proceed. The patient and the provider were the only parties present for the visit unless noted in HPI below.  History of Present Illness: The patient is a 64 y.o. female with visit for constipation. Last BM 13th. Started about 2-3 weeks ago. Denies having regular problems with going to the bathroom. Overall it is not improving much. Has tried lactulose 3 doses which did produce several BM on the 13th. She is having some abdominal pressure and swelling. Denies changes in diet recently. Has been trying to eat more fiber recently. Does take oxycodone but has been on that for a long time without problems with her stomach or constipation. Poor appetite but no nausea. Did vomit after drinking milkshake end of last week. Is also having more swelling in her legs and taking lasix 80 mg daily and feels she is not urinating as much as usual. With the constipation her low back is hurting more and this is causing her some misery. She does see pain management and is frustrated that they will not do injections on her due to pandemic and may need to find an orthopedic to do knee injections.   Observations/Objective: A and O times 3, no coughing or dyspnea during visit, voice strong.   Assessment and Plan: See problem oriented charting  Follow Up Instructions: advised dulcolax daily for 4-5 days to empty out [name] and follow up if swelling not improving in office with PCP.   Visit time 16 minutes  in non-face to face communication with patient and coordination of care.  I discussed the assessment and treatment plan with the patient. The patient was provided an opportunity to ask questions and all were answered. The patient agreed with the plan and demonstrated an understanding of the instructions.   The patient was advised to call back or seek an in-person evaluation if the symptoms worsen or if the condition fails to improve as anticipated.  Hoyt Koch, MD

## 2020-08-05 NOTE — Telephone Encounter (Signed)
Please refill as per office routine med refill policy (all routine meds refilled for 3 mo or monthly per pt preference up to one year from last visit, then month to month grace period for 3 mo, then further med refills will have to be denied)  

## 2020-08-06 ENCOUNTER — Telehealth: Payer: Self-pay | Admitting: Internal Medicine

## 2020-08-06 ENCOUNTER — Encounter (HOSPITAL_COMMUNITY): Payer: Self-pay | Admitting: Emergency Medicine

## 2020-08-06 ENCOUNTER — Other Ambulatory Visit: Payer: Self-pay

## 2020-08-06 ENCOUNTER — Emergency Department (HOSPITAL_COMMUNITY)
Admission: EM | Admit: 2020-08-06 | Discharge: 2020-08-06 | Disposition: A | Discharge status: Home / Self Care | Payer: Medicare Other | Attending: Emergency Medicine | Admitting: Emergency Medicine

## 2020-08-06 DIAGNOSIS — R6889 Other general symptoms and signs: Secondary | ICD-10-CM | POA: Diagnosis not present

## 2020-08-06 DIAGNOSIS — K5900 Constipation, unspecified: Secondary | ICD-10-CM | POA: Diagnosis not present

## 2020-08-06 DIAGNOSIS — R109 Unspecified abdominal pain: Secondary | ICD-10-CM | POA: Diagnosis not present

## 2020-08-06 DIAGNOSIS — Z5321 Procedure and treatment not carried out due to patient leaving prior to being seen by health care provider: Secondary | ICD-10-CM | POA: Insufficient documentation

## 2020-08-06 DIAGNOSIS — Z743 Need for continuous supervision: Secondary | ICD-10-CM | POA: Diagnosis not present

## 2020-08-06 LAB — COMPREHENSIVE METABOLIC PANEL
ALT: 25 U/L (ref 0–44)
AST: 24 U/L (ref 15–41)
Albumin: 4.2 g/dL (ref 3.5–5.0)
Alkaline Phosphatase: 68 U/L (ref 38–126)
Anion gap: 9 (ref 5–15)
BUN: 13 mg/dL (ref 8–23)
CO2: 31 mmol/L (ref 22–32)
Calcium: 8.8 mg/dL — ABNORMAL LOW (ref 8.9–10.3)
Chloride: 100 mmol/L (ref 98–111)
Creatinine, Ser: 1.08 mg/dL — ABNORMAL HIGH (ref 0.44–1.00)
GFR calc Af Amer: >60 mL/min (ref >60)
GFR calc non Af Amer: 55 mL/min — ABNORMAL LOW (ref >60)
Glucose, Bld: 137 mg/dL — ABNORMAL HIGH (ref 70–99)
Potassium: 4.3 mmol/L (ref 3.5–5.1)
Sodium: 140 mmol/L (ref 135–145)
Total Bilirubin: 0.9 mg/dL (ref 0.3–1.2)
Total Protein: 7.5 g/dL (ref 6.5–8.1)

## 2020-08-06 LAB — URINALYSIS, ROUTINE W REFLEX MICROSCOPIC
Bacteria, UA: NONE SEEN
Bilirubin Urine: NEGATIVE
Glucose, UA: NEGATIVE mg/dL
Hgb urine dipstick: NEGATIVE
Ketones, ur: NEGATIVE mg/dL
Nitrite: NEGATIVE
Protein, ur: NEGATIVE mg/dL
Specific Gravity, Urine: 1.008 (ref 1.005–1.030)
pH: 6 (ref 5.0–8.0)

## 2020-08-06 LAB — LIPASE, BLOOD: Lipase: 33 U/L (ref 11–51)

## 2020-08-06 LAB — CBC
HCT: 40.3 % (ref 36.0–46.0)
Hemoglobin: 13.2 g/dL (ref 12.0–15.0)
MCH: 30.3 pg (ref 26.0–34.0)
MCHC: 32.8 g/dL (ref 30.0–36.0)
MCV: 92.6 fL (ref 80.0–100.0)
Platelets: 228 10*3/uL (ref 150–400)
RBC: 4.35 MIL/uL (ref 3.87–5.11)
RDW: 13.3 % (ref 11.5–15.5)
WBC: 7.8 10*3/uL (ref 4.0–10.5)
nRBC: 0 % (ref 0.0–0.2)

## 2020-08-06 NOTE — ED Triage Notes (Signed)
Patient BIBA from home, c/c abdominal pain d/t constipation x2 weeks. States she has been using a few prescriptions that have not been working. Had 1 small BM yesterday.   BP 157/70 P 85 RR 18 T 97 97% RA

## 2020-08-06 NOTE — ED Triage Notes (Signed)
Talked to [place] yesterday on phone and did what was instructed but still hasnt helped.

## 2020-08-06 NOTE — Telephone Encounter (Signed)
   Scheduler spoke with patient  she plans to go to [facility] [name]   Maria [name] is constipated. She is taking medication and she still has nausea, vomiting, and abdominal pain. Plus her legs are swollen Maria [name]  Nurse Assessment Nurse: Ronnald Ramp, RN, Miranda Date/Time Maria [name] Time): 08/06/2020 1:51:27 PM ---Osvaldo Human states she is constipated. She has not had a BM in 2 weeks. She is having abdominal pain and vomiting. She is also having swelling in her legs

## 2020-08-06 NOTE — Telephone Encounter (Addendum)
    Patient calling to report extreme abdominal pain, nausea and vomiting. Patient states she feels something is "terribly wrong".  Call transferred to [facility] for immediate advice

## 2020-08-08 ENCOUNTER — Ambulatory Visit (HOSPITAL_COMMUNITY): Payer: Medicare Other | Admitting: Psychiatry

## 2020-08-09 DIAGNOSIS — G8929 Other chronic pain: Secondary | ICD-10-CM | POA: Diagnosis not present

## 2020-08-09 DIAGNOSIS — M79661 Pain in right lower leg: Secondary | ICD-10-CM | POA: Diagnosis not present

## 2020-08-09 DIAGNOSIS — Z79891 Long term (current) use of opiate analgesic: Secondary | ICD-10-CM | POA: Diagnosis not present

## 2020-08-09 DIAGNOSIS — M545 Low back pain: Secondary | ICD-10-CM | POA: Diagnosis not present

## 2020-08-09 DIAGNOSIS — M25551 Pain in right hip: Secondary | ICD-10-CM | POA: Diagnosis not present

## 2020-08-09 DIAGNOSIS — G894 Chronic pain syndrome: Secondary | ICD-10-CM | POA: Diagnosis not present

## 2020-08-09 DIAGNOSIS — M25512 Pain in left shoulder: Secondary | ICD-10-CM | POA: Diagnosis not present

## 2020-08-15 DIAGNOSIS — H02413 Mechanical ptosis of bilateral eyelids: Secondary | ICD-10-CM | POA: Diagnosis not present

## 2020-08-15 DIAGNOSIS — H0279 Other degenerative disorders of eyelid and periocular area: Secondary | ICD-10-CM | POA: Diagnosis not present

## 2020-08-15 DIAGNOSIS — H02834 Dermatochalasis of left upper eyelid: Secondary | ICD-10-CM | POA: Diagnosis not present

## 2020-08-15 DIAGNOSIS — H57813 Brow ptosis, bilateral: Secondary | ICD-10-CM | POA: Diagnosis not present

## 2020-08-15 DIAGNOSIS — H02831 Dermatochalasis of right upper eyelid: Secondary | ICD-10-CM | POA: Diagnosis not present

## 2020-08-31 IMAGING — MG MM DIGITAL DIAGNOSTIC UNILAT*R* W/ TOMO
4 series · 4 of 12 positions shown · non-contrast
Comparison: 10/10/2019 screening mammogram

CLINICAL DATA: 63-year-old female for further evaluation of
possible RIGHT breast mass on new baseline screening mammogram.

EXAM:
DIGITAL DIAGNOSTIC LEFT MAMMOGRAM WITH TOMO
ULTRASOUND LEFT BREAST

[R CC synth-2D]
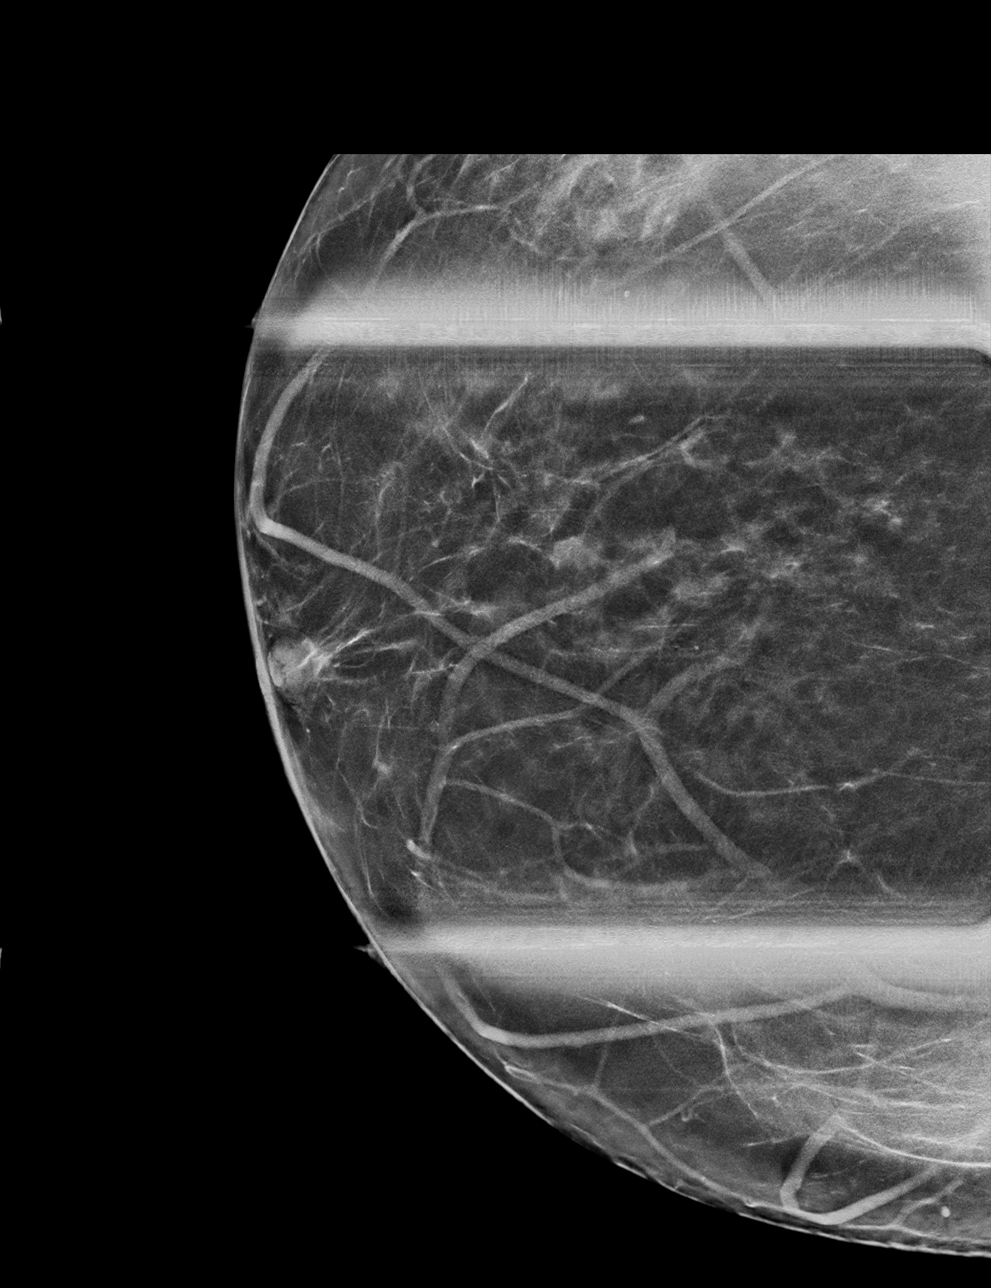

[R MLO synth-2D]
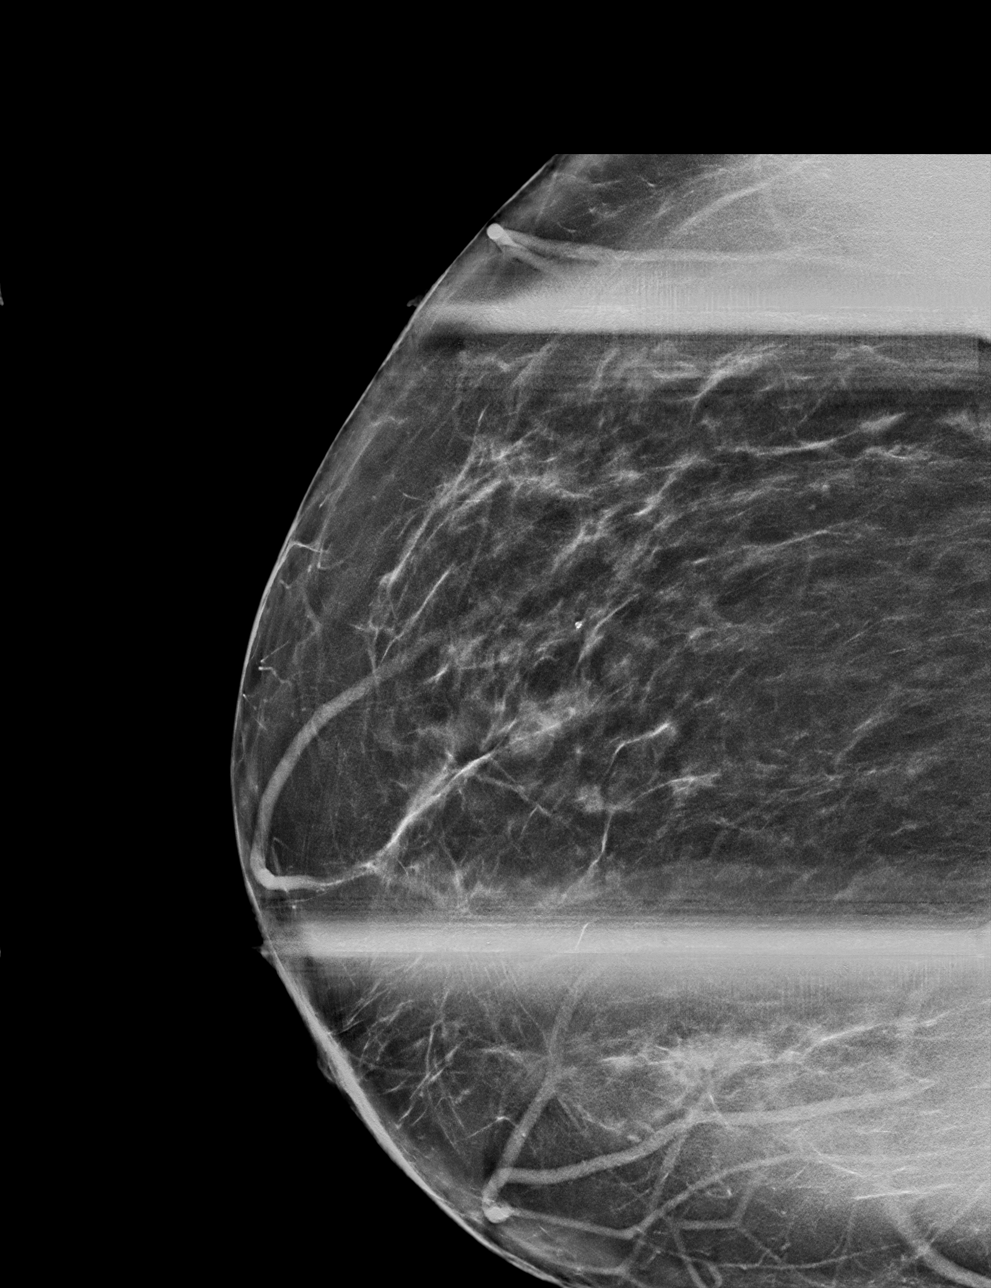

[R CC tomo · tomo slice 29/58.0]
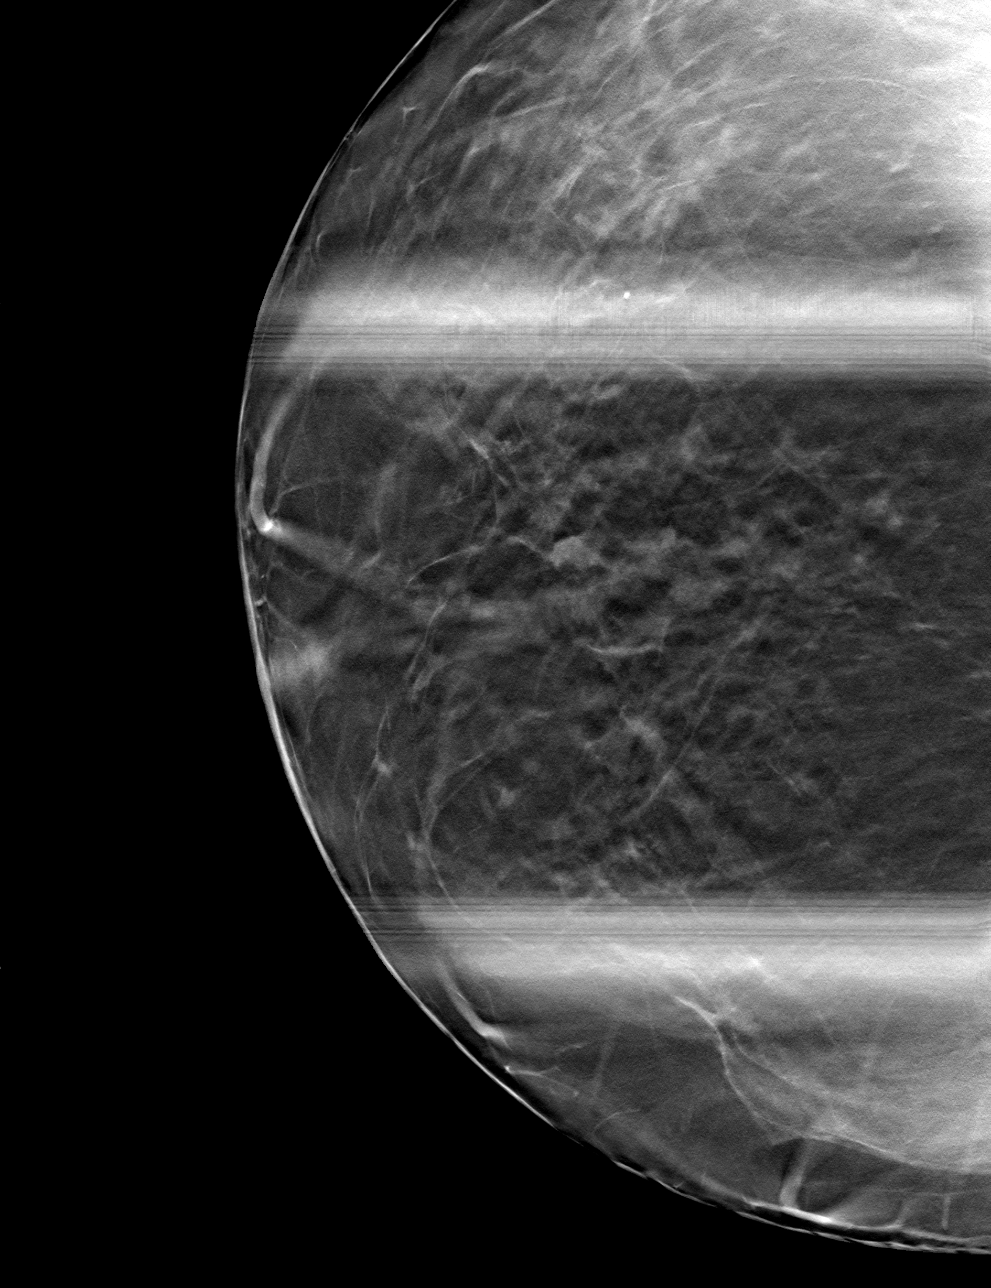

[R MLO tomo · tomo slice 37/74.0]
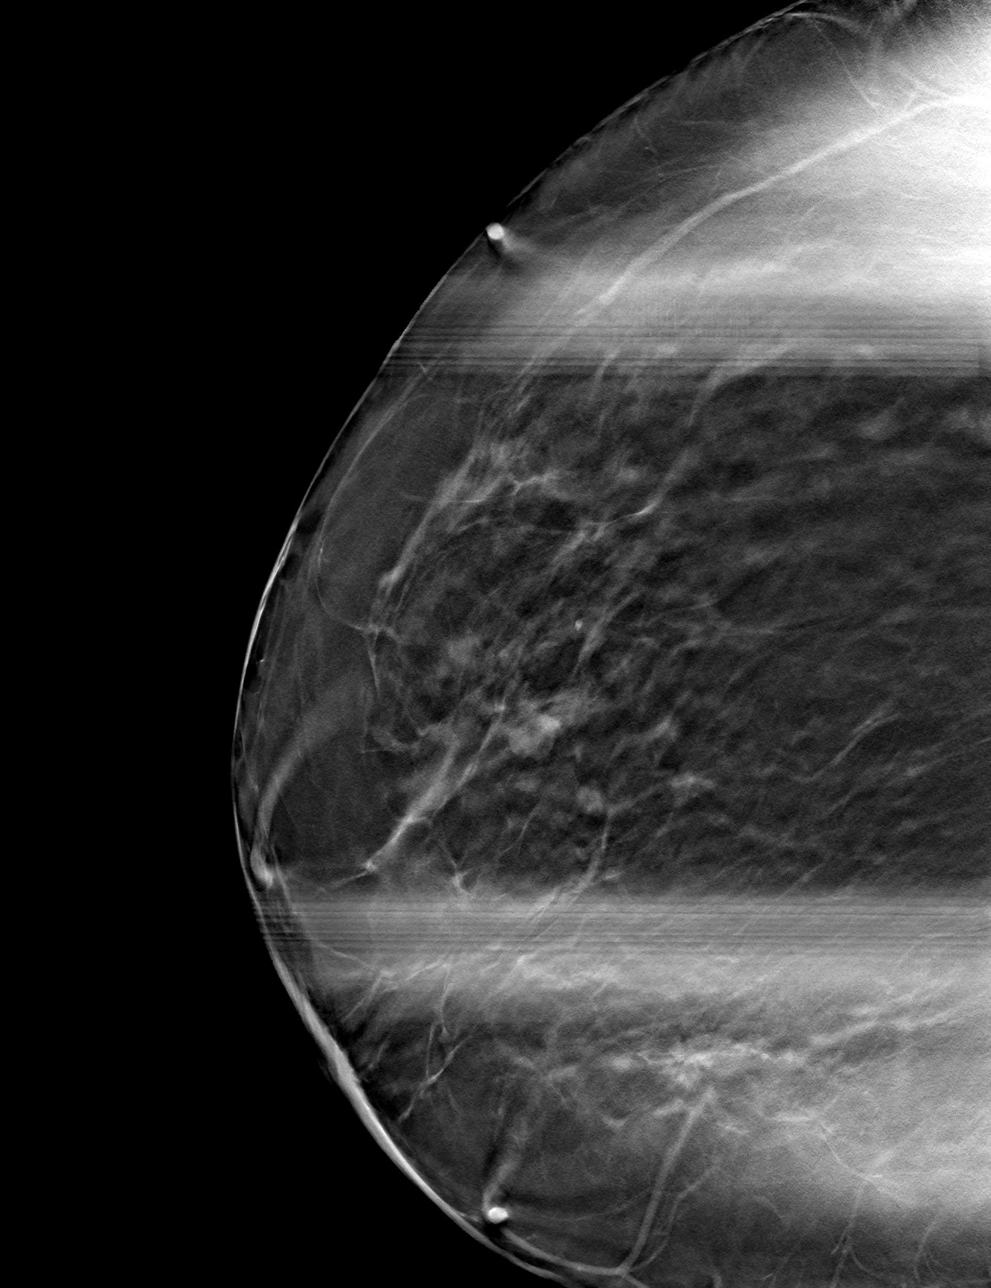

[4 of 12 positions shown; findings below may reference images not displayed]

ACR Breast Density Category b: There are scattered areas of
fibroglandular density.
FINDINGS: 2D/3D spot compression views of the RIGHT breast demonstrate a
persistent circumscribed oval mass within the UPPER RIGHT breast

Targeted ultrasound is performed, showing a 0.6 x 0.4 x 0.9 cm
circumscribed oval hypoechoic mass at the 12 o'clock position of the
RIGHT breast 4 cm from the nipple likely representing the screening
study abnormality.

No abnormal RIGHT axillary lymph nodes are identified.
IMPRESSION: Low suspicion indeterminate 0.9 cm mass within the UPPER RIGHT
breast. Options of tissue sampling and six-month follow-up were
discussed with the patient, and we both agreed to proceed with
tissue sampling.

RECOMMENDATION:
Ultrasound-guided RIGHT breast biopsy, which will be scheduled.

I have discussed the findings and recommendations with the patient.
If applicable, a reminder letter will be sent to the patient
regarding the next appointment.

BI-RADS CATEGORY  4: Suspicious.

## 2020-09-02 ENCOUNTER — Telehealth: Payer: Self-pay | Admitting: Internal Medicine

## 2020-09-02 NOTE — Telephone Encounter (Signed)
Sent to Dr. [name]. 

## 2020-09-02 NOTE — Telephone Encounter (Signed)
rOPINIRole (REQUIP) 3 MG tablet  [place], [place] [phone] Phone:  [phone]  Fax:  [phone]     Increase the dosage, or allow her to take twice daily... Current dose not working, raising her anxiety  Last appt: 8.16.21 Sharlet [place]) Next appt: Not available

## 2020-09-02 NOTE — Telephone Encounter (Signed)
I decline  Pt has chronic anxiety d/o that will not change with any change in requip dosing  Please ask pt to make rov

## 2020-09-04 NOTE — Telephone Encounter (Signed)
LVM informing pt of Dr. Gwynn Burly note and to please call the clinic to make an ov apptmnt.

## 2020-09-05 ENCOUNTER — Ambulatory Visit: Payer: Medicare Other

## 2020-09-06 IMAGING — US US  BREAST BX W/ LOC DEV 1ST LESION IMG BX SPEC US GUIDE*R*
1 series · 12 of 12 positions shown · non-contrast
Comparison: Previous exam(s).
COMPARISON: Previous exam(s).

Addendum:
CLINICAL DATA: Right breast mass.

EXAM:
ULTRASOUND GUIDED RIGHT BREAST CORE NEEDLE BIOPSY

[Series 1: us breast bx w/ loc dev 1st lesion img bx spec us  · 0.06mm/px · 12 of 12 slices shown]
[im 1/12]
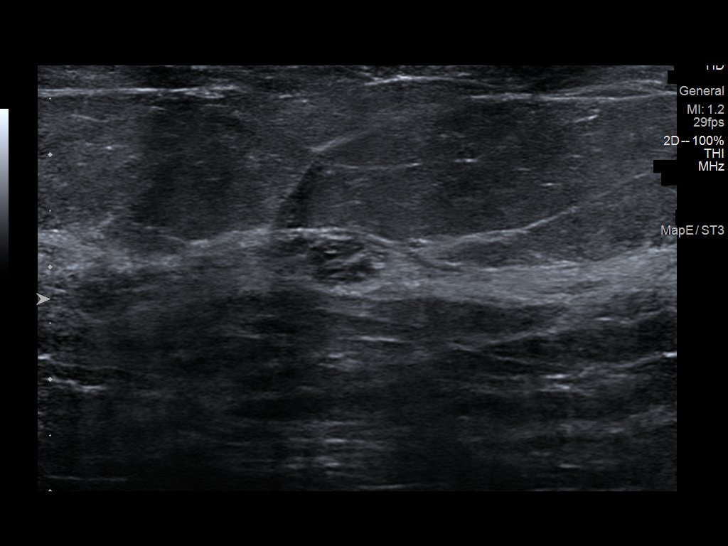
[im 2/12]
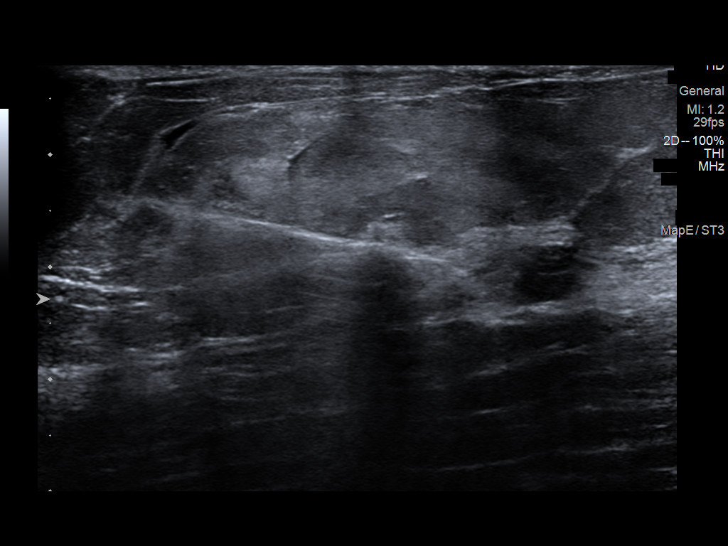
[im 3/12]
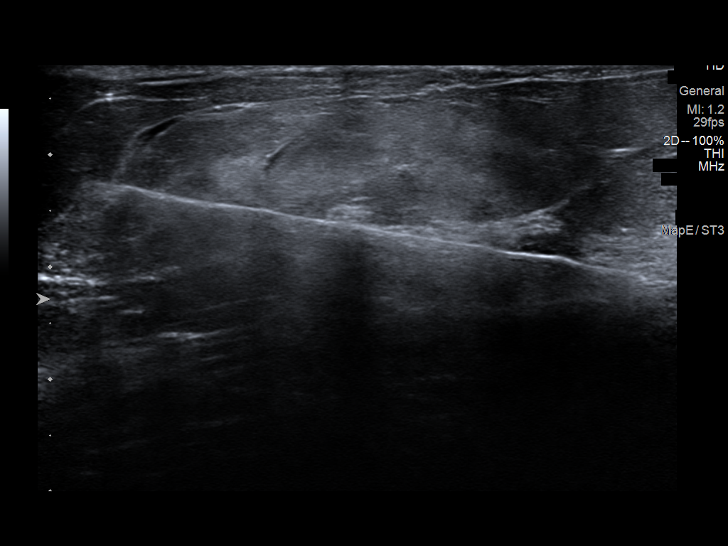
[im 4/12]
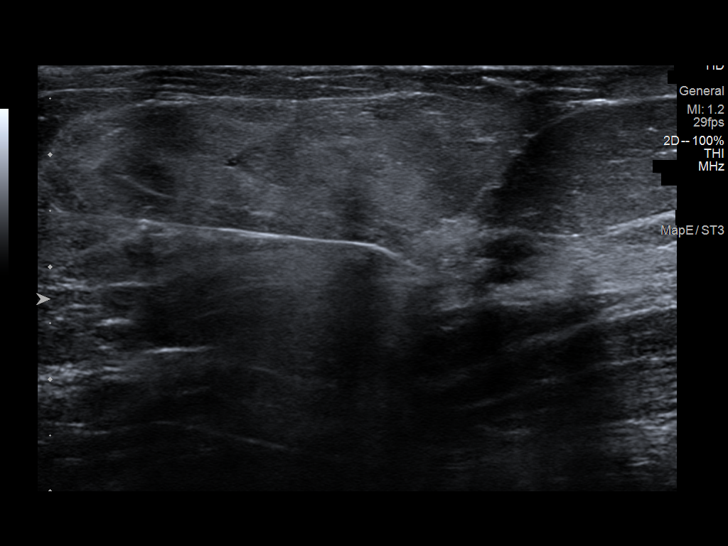
[im 5/12]
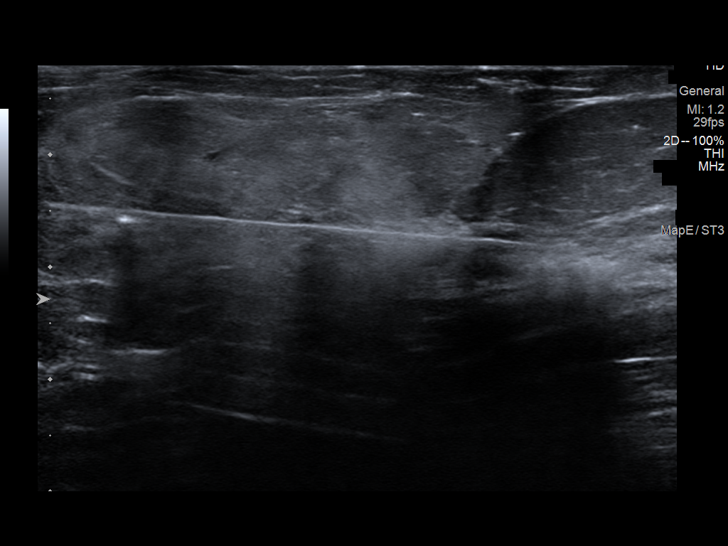
[im 6/12]
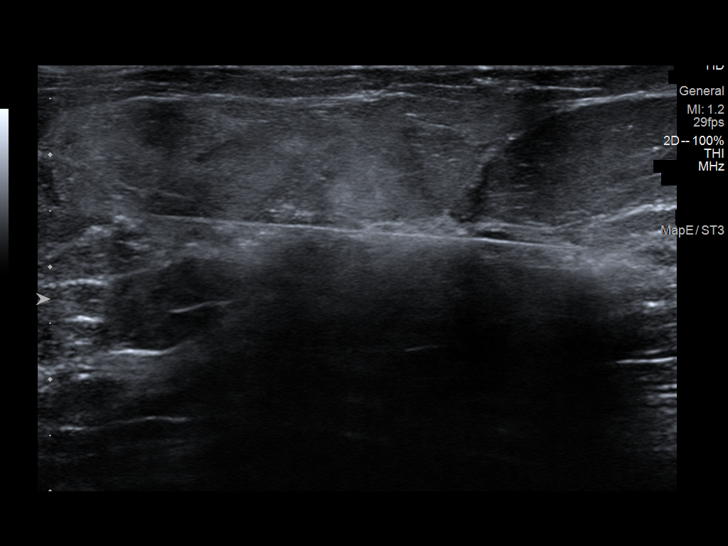
[im 7/12]
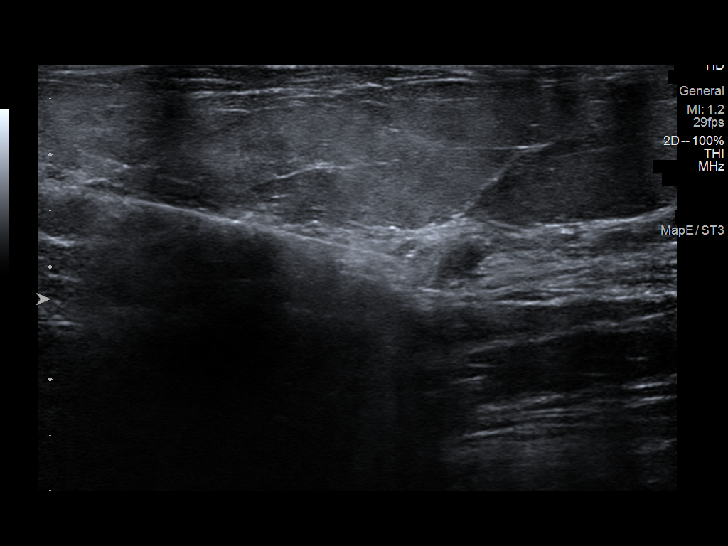
[im 8/12]
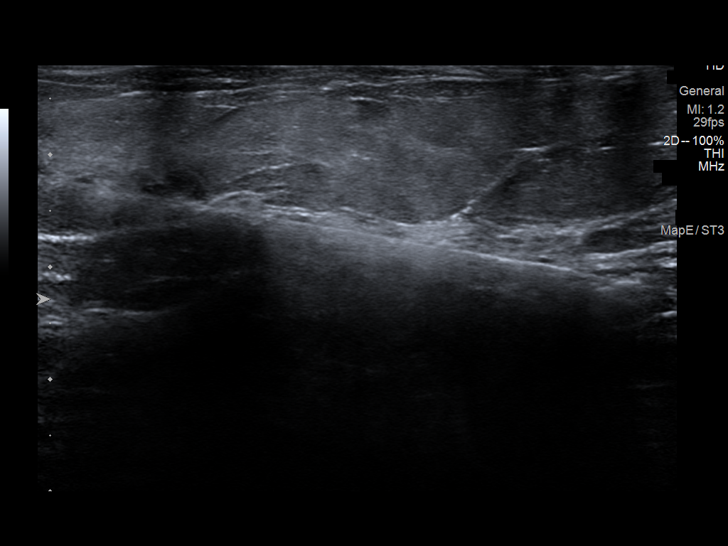
[im 9/12]
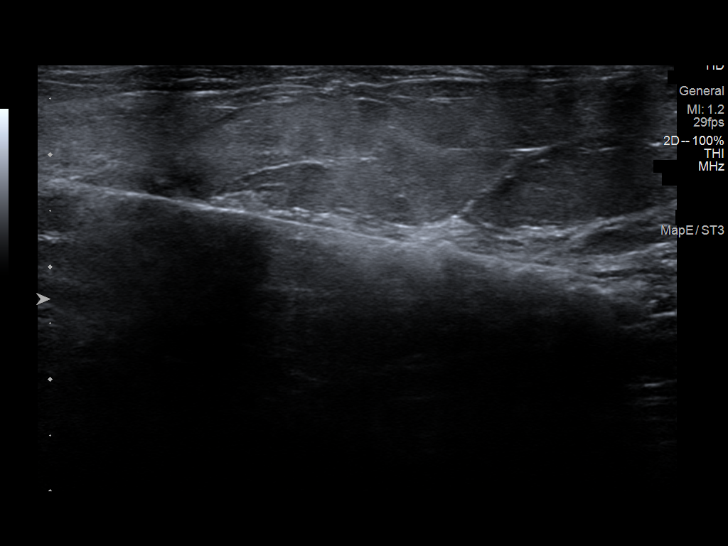
[im 10/12]
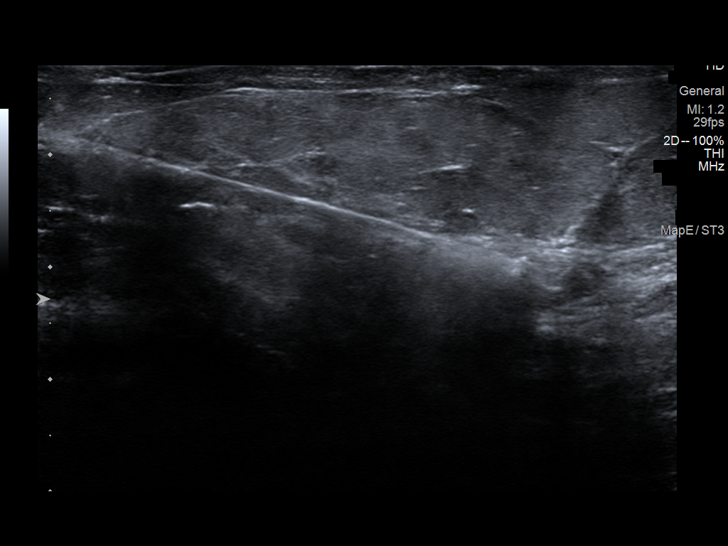
[im 11/12]
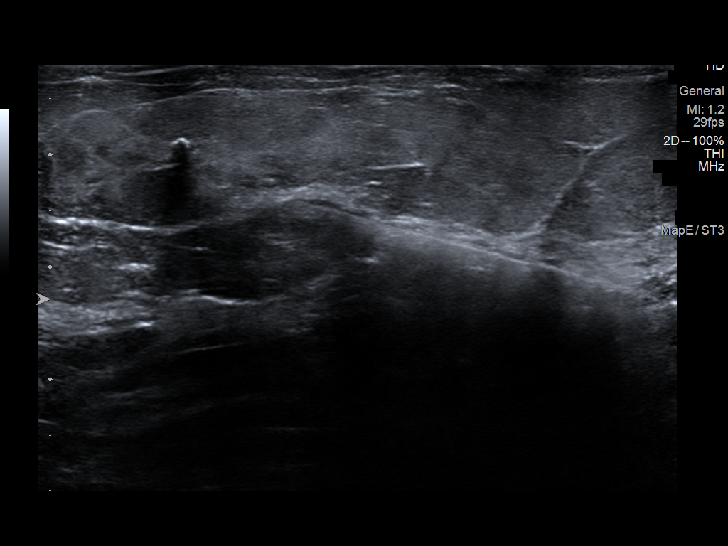
[im 12/12]
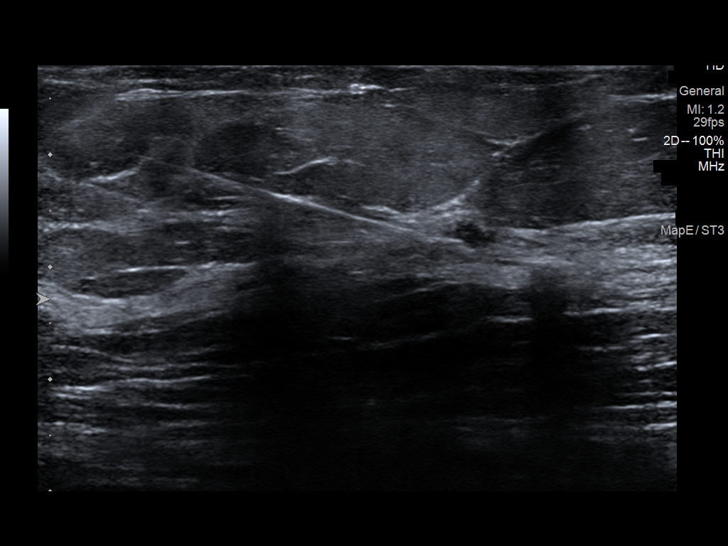

[12 of 12 positions shown; findings below may reference images not displayed]



Lesion quadrant: 12 o'clock

Using sterile technique and 1% lidocaine and 1% lidocaine with
epinephrine as local anesthetic, under direct ultrasound
visualization, a 12 gauge Awank device was used to perform
biopsy of a mass in the 12 o'clock region of the right breast using
a lateral to medial approach. At the conclusion of the procedure
ribbon shaped tissue marker clip was deployed into the biopsy
cavity. Follow up 2 view mammogram was performed and dictated
separately.
IMPRESSION: Ultrasound guided biopsy of the right breast. No apparent
complications.

ADDENDUM:
Pathology revealed FIBROADENOMA of the RIGHT breast, 12 o'clock.
This was found to be concordant by Dr. Sayyadah Gascony.

Pathology results were discussed with the patient by telephone. The
patient reported doing well after the biopsy with tenderness at the
site. Post biopsy instructions and care were reviewed and questions
were answered. The patient was encouraged to call The [REDACTED]

The patient was instructed to return for annual screening
mammography.

Pathology results reported by Hans Sigurd Tesfagabir, RN on 10/17/2019.



Lesion quadrant: 12 o'clock

Using sterile technique and 1% lidocaine and 1% lidocaine with
epinephrine as local anesthetic, under direct ultrasound
visualization, a 12 gauge Awank device was used to perform
biopsy of a mass in the 12 o'clock region of the right breast using
a lateral to medial approach. At the conclusion of the procedure
ribbon shaped tissue marker clip was deployed into the biopsy
cavity. Follow up 2 view mammogram was performed and dictated
separately.
IMPRESSION: Ultrasound guided biopsy of the right breast. No apparent
complications.

## 2020-09-10 ENCOUNTER — Other Ambulatory Visit: Payer: Self-pay

## 2020-09-10 ENCOUNTER — Ambulatory Visit (INDEPENDENT_AMBULATORY_CARE_PROVIDER_SITE_OTHER): Payer: Medicare Other | Admitting: Family

## 2020-09-10 ENCOUNTER — Encounter: Payer: Self-pay | Admitting: Family

## 2020-09-10 VITALS — BP 145/74 | HR 89 | Temp 98.6°F | Ht 65.0 in | Wt 369.0 lb

## 2020-09-10 DIAGNOSIS — R600 Localized edema: Secondary | ICD-10-CM | POA: Diagnosis not present

## 2020-09-10 DIAGNOSIS — K56609 Unspecified intestinal obstruction, unspecified as to partial versus complete obstruction: Secondary | ICD-10-CM

## 2020-09-10 DIAGNOSIS — R1012 Left upper quadrant pain: Secondary | ICD-10-CM

## 2020-09-10 NOTE — Progress Notes (Signed)
Maria [name] is a 64 y.o. female with the following history as recorded in [employer]:  Patient Active Problem List   Diagnosis Date Noted  . Constipation 08/05/2020  . Hoarseness 05/01/2020  . Vitamin B 12 deficiency 01/31/2020  . Rosacea 01/31/2020  . Neck swelling 01/31/2020  . History of colonic polyps   . Benign neoplasm of [place]   . Nausea 08/31/2019  . Throat pain 07/26/2019  . HLD (hyperlipidemia) 05/24/2019  . Leg cramps 05/24/2019  . Lump in neck 05/24/2019  . Hyperglycemia 01/25/2019  . Left thyroid nodule 01/25/2019  . Jerking movements of extremities 07/20/2018  . Balance disorder 07/20/2018  . Allergic rhinitis 06/15/2018  . Right knee pain 03/21/2018  . OSA (obstructive sleep apnea) 03/21/2018  . Oxygen desaturation 03/21/2018  . Urinary symptom or sign 03/21/2018  . Acquired lymphedema 01/25/2018  . Gait disorder 01/25/2018  . Peripheral edema 01/16/2018  . SOB (shortness of breath) 01/15/2018  . 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 Encounter for well adult exam with abnormal findings 11/08/2017  . Abdominal pain, epigastric   . Dysphagia   . [place] cancer screening   . Adverse effect of angiotensin-converting enzyme inhibitor 12/05/2015  . Restless leg 10/08/2015  . Dermatitis, stasis 04/26/2015  . Fibromyalgia 01/31/2015  . Acid reflux 10/17/2014  . 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 Post-traumatic osteoarthritis of both knees 09/19/2014  . Spinal stenosis 09/19/2014  . Cervical disc disease 03/09/2012  . Lumbar disc disease 03/09/2012  . Chronic pain 03/09/2012  . Obesity 06/17/2011  . Chronic low back pain 12/28/2008  . Anxiety state 12/06/2007  . Depression 12/06/2007  . Essential hypertension 12/06/2007  . LOW BACK PAIN 12/06/2007    Current Outpatient Medications  Medication Sig Dispense Refill  . acetaminophen (TYLENOL) 325 MG tablet Take 650 mg by mouth every 6 (six) hours as needed for headache (pain).    .  clonazePAM (KLONOPIN) 1 MG tablet TAKE 1 TABLET BY MOUTH 2 TIMES A DAY AS NEEDED FOR ANXIETY 60 tablet 5  . cyclobenzaprine (FLEXERIL) 10 MG tablet TAKE 1 TABLET BY MOUTH 2 TIMES A DAY AS NEEDED FOR MUSCLE SPASMS (Patient taking differently: Take 10 mg by mouth 2 (two) times daily as needed for muscle spasms. ) 180 tablet 1  . furosemide (LASIX) 40 MG tablet Take 1 tablet (40 mg total) by mouth daily. 90 tablet 3  . losartan (COZAAR) 100 MG tablet TAKE 1 TABLET BY MOUTH DAILY 90 tablet 1  . metroNIDAZOLE (METROCREAM) 0.75 % cream Apply topically 2 (two) times daily. 45 g 5  . NARCAN 4 MG/0.1ML LIQD nasal spray kit Place 0.4 mg into the nose daily as needed (opioid overdose).     . ondansetron (ZOFRAN ODT) 4 MG disintegrating tablet Take 1 tablet (4 mg total) by mouth every 4 (four) hours as needed for nausea or vomiting. 20 tablet 0  . Oxycodone HCl 10 MG TABS Take 1 tablet (10 mg total) 4 (four) times daily by mouth. Per [facility] (Patient taking differently: Take 10 mg by mouth every 4 (four) hours as needed (pain). Per [facility]) 30 tablet 0  . pantoprazole (PROTONIX) 40 MG tablet TAKE 1 TABLET BY MOUTH ONCE DAILY 90 tablet 1  . potassium chloride (K-DUR) 10 MEQ tablet TAKE 1 TABLET BY MOUTH DAILY (Patient taking differently: Take 10 mEq by mouth daily as needed (cramps). ) 90 tablet 0  . rOPINIRole (REQUIP) 3 MG tablet TAKE 1 TABLET BY MOUTH  NIGHTLY AT BEDTIME AS NEEDED 30 tablet 2  . sertraline (ZOLOFT) 100 MG tablet TAKE 1 & ONE-HALF TABLET BY MOUTH EVERY DAY 135 tablet 1  . Tetrahydrozoline HCl (VISINE OP) Place 1 drop into both eyes daily as needed (dry eyes).     . triamcinolone (NASACORT) 55 MCG/ACT AERO nasal inhaler Place 2 sprays into the nose daily. (Patient taking differently: Place 2 sprays into the nose daily as needed (sneezing/runny nose). ) 1 Inhaler 12  . vitamin B-12 (CYANOCOBALAMIN) 1000 MCG tablet Take 1 tablet (1,000 mcg total) by mouth daily. 90 tablet 3   . Vitamin D, Ergocalciferol, (DRISDOL) 1.25 MG (50000 UNIT) CAPS capsule Take 1 capsule (50,000 Units total) by mouth every 7 (seven) days. 12 capsule 0  . metolazone (ZAROXOLYN) 2.5 MG tablet Take by mouth.    . potassium chloride SA (KLOR-CON) 20 MEQ tablet potassium chloride ER 20 mEq tablet,extended release(part/cryst)  TAKE 1 TABLET BY MOUTH DAILY     No current facility-administered medications for this visit.    Allergies: Amoxicillin-pot clavulanate, Adhesive [tape], Codeine, Crestor [rosuvastatin calcium], Lactose intolerance (gi), Morphine and related, Vicodin [hydrocodone-acetaminophen], and Latex  Past Medical History:  Diagnosis Date  . Acute lymphadenitis 2011  . ALLERGIC RHINITIS 08/10/2007  . ANXIETY 12/06/2007  . Atherosclerotic peripheral vascular disease ([place]) 06/13/2013   Aorta on CT June 2014  . BACK PAIN 12/06/2007  . Cellulitis 2011  . Cervical disc disease 03/09/2012  . Chronic pain 03/09/2012  . DEPRESSION 12/06/2007  . DIVERTICULOSIS, [place] 12/06/2007  . GERD 12/06/2007  . Hepatitis    age 40 hepatitis A  . HLD (hyperlipidemia) 05/24/2019  . HYPERTENSION 12/06/2007  . Impaired glucose tolerance 03/13/2012  . LOW BACK PAIN 12/06/2007  . Lumbar disc disease 03/09/2012  . Mesenteric adenitis   . MRSA 2006  . Pain    by breast area  . Pain in joint, multiple sites 04/21/2010  . PARONYCHIA, FINGER 08/13/2009  . Pneumonia sept 2015   walking pneumonia, sepsis   . RASH-NONVESICULAR 05/09/2008  . 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 SHOULDER PAIN, LEFT 05/09/2008  . SINUSITIS- ACUTE-NOS 05/09/2008  . THORACIC/LUMBOSACRAL NEURITIS/RADICULITIS UNSPEC 12/28/2008    Past Surgical History:  Procedure Laterality Date  . ABDOMINAL HYSTERECTOMY  1999   1 ovary left  . bloo clot removed from neck   may 25th , june 2. 2010  . [place] SURGERY  may 24th 2010  . COLONOSCOPY WITH PROPOFOL N/A 07/10/2016   Procedure: COLONOSCOPY WITH PROPOFOL;  Surgeon: Manus Gunning, MD;  Location: [facility];  Service: Gastroenterology;  Laterality: N/A;  . COLONOSCOPY WITH PROPOFOL N/A 09/26/2019   Procedure: COLONOSCOPY WITH PROPOFOL;  Surgeon: Yetta Flock, MD;  Location: [facility];  Service: Gastroenterology;  Laterality: N/A;  . ESOPHAGOGASTRODUODENOSCOPY (EGD) WITH PROPOFOL N/A 07/10/2016   Procedure: ESOPHAGOGASTRODUODENOSCOPY (EGD) WITH PROPOFOL;  Surgeon: Manus Gunning, MD;  Location: [facility];  Service: Gastroenterology;  Laterality: N/A;  . POLYPECTOMY  09/26/2019   Procedure: POLYPECTOMY;  Surgeon: Yetta Flock, MD;  Location: [facility];  Service: Gastroenterology;;  . s/p ovary cyst    . s/p right knee arthroscopy     Dr. Mardelle Matte ortho    Family History  Problem Relation Age of Onset  . Heart disease Mother   . Hypertension Mother   . Diabetes Mother   . Heart failure Mother   . Asthma Sister   . Anxiety disorder Sister   . Depression Sister   .  Hypertension Father   . Asthma Daughter   . Bipolar disorder Daughter   . Cancer Maternal Uncle        [place]  . Cancer Other        ovarian  . Liver cancer Paternal Grandmother        ????    Social History   Tobacco Use  . Smoking status: Former Smoker    Years: 30.00    Quit date: 11/08/2008    Years since quitting: 11.8  . Smokeless tobacco: Never Used  . Tobacco comment: quit 10/09  Substance Use Topics  . Alcohol use: No    Comment: quit drinking 10/07/1997    Subjective:  1 month history of worsening abdominal pain/ constipation; has been having increased LUQ pain for the past 3-4 days; of note, has also been having increased swelling in her lower legs for the past month as well; no fever or vomiting; no coffee grounds emesis;   Objective:  Vitals:   09/10/20 1224  BP: (!) 145/74  Pulse: 89  Temp: 98.6 F (37 C)  TempSrc: Oral  SpO2: 91%  Weight: (!) 369 lb (167.4 kg)  Height: _0  (1.651 m)    General: Well developed, well  nourished, in no acute distress  Head: Normocephalic and atraumatic  Lungs: Respirations unlabored; clear to auscultation bilaterally without wheeze, rales, rhonchi  Abdomen: Soft; nontender; nondistended; normoactive bowel sounds; no masses or hepatosplenomegaly; limited evaluation due to body habitus of patient Extremities: bilateral pedal edema, cyanosis, clubbing  Vessels: Symmetric bilaterally  Neurologic: Alert and oriented; speech intact; face symmetrical; moves all extremities well; CNII-XII intact without focal deficit   Assessment:  1. LUQ pain   2. Intestinal obstruction, unspecified cause, unspecified whether partial or complete (HCC)   3. Pedal edema     Plan:  Concern for pancreatic source or bowel obstruction based on presentation; patient does not want to go to ER and cannot get to CT before Friday of this week;  Will update abdominal/ pelvic CT; check BNP today; Follow-up to be determined; strict Er precautions discussed;  This visit occurred during the SARS-CoV-2 public health emergency.  Safety protocols were in place, including screening questions prior to the visit, additional usage of staff PPE, and extensive cleaning of exam room while observing appropriate contact time as indicated for disinfecting solutions.     No follow-ups on file.  Orders Placed This Encounter  Procedures  . CT Abdomen Pelvis W Contrast    [employer] [facility] [place] MEDICARE    Standing Status:   Future    Standing Expiration Date:   09/10/2021    Scheduling Instructions:     Needs to be done this Friday to allow patient to arrange transportation    Order Specific Question:   If indicated for the ordered procedure, I authorize the administration of contrast media per Radiology protocol    Answer:   Yes    Order Specific Question:   Preferred imaging location?    Answer:   [facility]    Order Specific Question:   Is Oral Contrast requested for this exam?    Answer:   Yes, Per  Radiology protocol    Order Specific Question:   Radiology Contrast Protocol - do NOT remove file path    Answer:   [email or web address]  . CBC with Differential/Platelet    Standing Status:   Future    Number of Occurrences:   1    Standing Expiration Date:  09/10/2021  . Comp Met (CMET)    Standing Status:   Future    Number of Occurrences:   1    Standing Expiration Date:   09/10/2021  . Amylase    Standing Status:   Future    Number of Occurrences:   1    Standing Expiration Date:   09/10/2021  . Lipase    Standing Status:   Future    Number of Occurrences:   1    Standing Expiration Date:   09/10/2021  . B Nat Peptide    Standing Status:   Future    Number of Occurrences:   1    Standing Expiration Date:   09/10/2021    Requested Prescriptions    No prescriptions requested or ordered in this encounter

## 2020-09-11 LAB — CBC WITH DIFFERENTIAL/PLATELET
Absolute Monocytes: 468 cells/uL (ref 200–950)
Basophils Absolute: 50 cells/uL (ref 0–200)
Basophils Relative: 0.7 %
Eosinophils Absolute: 79 cells/uL (ref 15–500)
Eosinophils Relative: 1.1 %
HCT: 37.6 % (ref 35.0–45.0)
Hemoglobin: 12.7 g/dL (ref 11.7–15.5)
Lymphs Abs: 2333 cells/uL (ref 850–3900)
MCH: 30.5 pg (ref 27.0–33.0)
MCHC: 33.8 g/dL (ref 32.0–36.0)
MCV: 90.4 fL (ref 80.0–100.0)
MPV: 10.9 fL (ref 7.5–12.5)
Monocytes Relative: 6.5 %
Neutro Abs: 4270 cells/uL (ref 1500–7800)
Neutrophils Relative %: 59.3 %
Platelets: 265 10*3/uL (ref 140–400)
RBC: 4.16 10*6/uL (ref 3.80–5.10)
RDW: 12.6 % (ref 11.0–15.0)
Total Lymphocyte: 32.4 %
WBC: 7.2 10*3/uL (ref 3.8–10.8)

## 2020-09-11 LAB — COMPREHENSIVE METABOLIC PANEL
AG Ratio: 1.4 (calc) (ref 1.0–2.5)
ALT: 20 U/L (ref 6–29)
AST: 19 U/L (ref 10–35)
Albumin: 4.1 g/dL (ref 3.6–5.1)
Alkaline phosphatase (APISO): 69 U/L (ref 37–153)
BUN/Creatinine Ratio: 13 (calc) (ref 6–22)
BUN: 14 mg/dL (ref 7–25)
CO2: 31 mmol/L (ref 20–32)
Calcium: 9.4 mg/dL (ref 8.6–10.4)
Chloride: 102 mmol/L (ref 98–110)
Creat: 1.05 mg/dL — ABNORMAL HIGH (ref 0.50–0.99)
Globulin: 2.9 g/dL (calc) (ref 1.9–3.7)
Glucose, Bld: 107 mg/dL — ABNORMAL HIGH (ref 65–99)
Potassium: 4.3 mmol/L (ref 3.5–5.3)
Sodium: 141 mmol/L (ref 135–146)
Total Bilirubin: 1.1 mg/dL (ref 0.2–1.2)
Total Protein: 7 g/dL (ref 6.1–8.1)

## 2020-09-11 LAB — AMYLASE: Amylase: 23 U/L (ref 21–101)

## 2020-09-11 LAB — LIPASE: Lipase: 18 U/L (ref 7–60)

## 2020-09-11 LAB — BRAIN NATRIURETIC PEPTIDE: Brain Natriuretic Peptide: 19 pg/mL (ref <100)

## 2020-09-13 ENCOUNTER — Ambulatory Visit
Admission: RE | Admit: 2020-09-13 | Discharge: 2020-09-13 | Disposition: A | Discharge status: Home / Self Care | Payer: Medicare Other | Source: Ambulatory Visit | Attending: Family | Admitting: Family

## 2020-09-13 DIAGNOSIS — I700 Atherosclerosis of aorta: Secondary | ICD-10-CM | POA: Diagnosis not present

## 2020-09-13 DIAGNOSIS — K429 Umbilical hernia without obstruction or gangrene: Secondary | ICD-10-CM | POA: Diagnosis not present

## 2020-09-13 DIAGNOSIS — K56609 Unspecified intestinal obstruction, unspecified as to partial versus complete obstruction: Secondary | ICD-10-CM

## 2020-09-13 MED ORDER — IOPAMIDOL (ISOVUE-300) INJECTION 61%
100.0000 mL | Freq: Once | INTRAVENOUS | Status: AC | PRN
  Administered 2020-09-13: 100 mL via INTRAVENOUS

## 2020-09-16 ENCOUNTER — Telehealth: Payer: Self-pay | Admitting: Internal Medicine

## 2020-09-16 NOTE — Telephone Encounter (Signed)
    Please call to discuss CT results.   

## 2020-09-16 NOTE — Telephone Encounter (Signed)
Seen by [name] on 09/16/2020 1:49 PM

## 2020-09-16 NOTE — Telephone Encounter (Signed)
Spoke with patient and went over results. Appointment already made for her to come back and see Dr. [name] Reichmann.

## 2020-09-20 ENCOUNTER — Ambulatory Visit (INDEPENDENT_AMBULATORY_CARE_PROVIDER_SITE_OTHER): Payer: Medicare Other

## 2020-09-20 VITALS — BP 139/67 | HR 71 | Ht 65.0 in | Wt 364.0 lb

## 2020-09-20 DIAGNOSIS — Z0000 Encounter for general adult medical examination without abnormal findings: Secondary | ICD-10-CM

## 2020-09-20 NOTE — Patient Instructions (Signed)
Maria [name] , Thank you for taking time to come for your Medicare Wellness Visit. I appreciate your ongoing commitment to your health goals. Please review the following plan we discussed and let me know if I can assist you in the future.   Screening recommendations/referrals: Colonoscopy: 09/26/2019; due every 10 years Mammogram: 10/10/2019 Bone Density: never done Recommended yearly ophthalmology/optometry visit for glaucoma screening and checkup Recommended yearly dental visit for hygiene and checkup  Vaccinations: Influenza vaccine: 12/30/2018 Pneumococcal vaccine: Pneumovax 23 done on 08/18/2013 Tdap vaccine: 11/08/2017; due every 10 years Shingles vaccine: never done  Covid-19: never done  Advanced directives: Advance directive discussed with you today. Even though you declined this today please call our office should you change your mind and we can give you the proper paperwork for you to fill out.  Conditions/risks identified: Yes. Reviewed health maintenance screenings with patient today and relevant education, vaccines, and/or referrals were provided. Continue doing brain stimulating activities (puzzles, reading, adult coloring books, staying active) to keep memory sharp. Continue to eat heart healthy diet (full of fruits, vegetables, whole grains, lean protein, water--limit salt, fat, and sugar intake) and increase physical activity as tolerated.  Next appointment: Please schedule your next Medicare Wellness Visit with your Nurse Health Advisor in 1 year by calling [phone].  Preventive Care 40-64 Years, Female Preventive care refers to lifestyle choices and visits with your health care provider that can promote health and wellness. What does preventive care include?  A yearly physical exam. This is also called an annual well check.  Dental exams once or twice a year.  Routine eye exams. Ask your health care provider how often you should have your eyes checked.  Personal  lifestyle choices, including:  Daily care of your teeth and gums.  Regular physical activity.  Eating a healthy diet.  Avoiding tobacco and drug use.  Limiting alcohol use.  Practicing safe sex.  Taking low-dose aspirin daily starting at age 75.  Taking vitamin and mineral supplements as recommended by your health care provider. What happens during an annual well check? The services and screenings done by your health care provider during your annual well check will depend on your age, overall health, lifestyle risk factors, and family history of disease. Counseling  Your health care provider may ask you questions about your:  Alcohol use.  Tobacco use.  Drug use.  Emotional well-being.  Home and relationship well-being.  Sexual activity.  Eating habits.  Work and work Statistician.  Method of birth control.  Menstrual cycle.  Pregnancy history. Screening  You may have the following tests or measurements:  Height, weight, and BMI.  Blood pressure.  Lipid and cholesterol levels. These may be checked every 5 years, or more frequently if you are over 62 years old.  Skin check.  Lung cancer screening. You may have this screening every year starting at age 63 if you have a 30-pack-year history of smoking and currently smoke or have quit within the past 15 years.  Fecal occult blood test (FOBT) of the stool. You may have this test every year starting at age 39.  Flexible sigmoidoscopy or colonoscopy. You may have a sigmoidoscopy every 5 years or a colonoscopy every 10 years starting at age 71.  Hepatitis C blood test.  Hepatitis B blood test.  Sexually transmitted disease (STD) testing.  Diabetes screening. This is done by checking your blood sugar (glucose) after you have not eaten for a while (fasting). You may have this done every  1-3 years.  Mammogram. This may be done every 1-2 years. Talk to your health care provider about when you should start having  regular mammograms. This may depend on whether you have a family history of breast cancer.  BRCA-related cancer screening. This may be done if you have a family history of breast, ovarian, tubal, or peritoneal cancers.  Pelvic exam and Pap test. This may be done every 3 years starting at age 24. Starting at age 71, this may be done every 5 years if you have a Pap test in combination with an HPV test.  Bone density scan. This is done to screen for osteoporosis. You may have this scan if you are at high risk for osteoporosis. Discuss your test results, treatment options, and if necessary, the need for more tests with your health care provider. Vaccines  Your health care provider may recommend certain vaccines, such as:  Influenza vaccine. This is recommended every year.  Tetanus, diphtheria, and acellular pertussis (Tdap, Td) vaccine. You may need a Td booster every 10 years.  Zoster vaccine. You may need this after age 50.  Pneumococcal 13-valent conjugate (PCV13) vaccine. You may need this if you have certain conditions and were not previously vaccinated.  Pneumococcal polysaccharide (PPSV23) vaccine. You may need one or two doses if you smoke cigarettes or if you have certain conditions. Talk to your health care provider about which screenings and vaccines you need and how often you need them. This information is not intended to replace advice given to you by your health care provider. Make sure you discuss any questions you have with your health care provider. Document Released: 01/03/2016 Document Revised: 08/26/2016 Document Reviewed: 10/08/2015 Elsevier Interactive Patient Education  2017 [place] Prevention in the Home Falls can cause injuries. They can happen to people of all ages. There are many things you can do to make your home safe and to help prevent falls. What can I do on the outside of my home?  Regularly fix the edges of walkways and driveways and fix any  cracks.  Remove anything that might make you trip as you walk through a door, such as a raised step or threshold.  Trim any bushes or trees on the path to your home.  Use bright outdoor lighting.  Clear any walking paths of anything that might make someone trip, such as rocks or tools.  Regularly check to see if handrails are loose or broken. Make sure that both sides of any steps have handrails.  Any raised decks and porches should have guardrails on the edges.  Have any leaves, snow, or ice cleared regularly.  Use sand or salt on walking paths during winter.  Clean up any spills in your garage right away. This includes oil or grease spills. What can I do in the bathroom?  Use night lights.  Install grab bars by the toilet and in the tub and shower. Do not use towel bars as grab bars.  Use non-skid mats or decals in the tub or shower.  If you need to sit down in the shower, use a plastic, non-slip stool.  Keep the floor dry. Clean up any water that spills on the floor as soon as it happens.  Remove soap buildup in the tub or shower regularly.  Attach bath mats securely with double-sided non-slip rug tape.  Do not have throw rugs and other things on the floor that can make you trip. What can I do  in the bedroom?  Use night lights.  Make sure that you have a light by your bed that is easy to reach.  Do not use any sheets or blankets that are too big for your bed. They should not hang down onto the floor.  Have a firm chair that has side arms. You can use this for support while you get dressed.  Do not have throw rugs and other things on the floor that can make you trip. What can I do in the kitchen?  Clean up any spills right away.  Avoid walking on wet floors.  Keep items that you use a lot in easy-to-reach places.  If you need to reach something above you, use a strong step stool that has a grab bar.  Keep electrical cords out of the way.  Do not use floor  polish or wax that makes floors slippery. If you must use wax, use non-skid floor wax.  Do not have throw rugs and other things on the floor that can make you trip. What can I do with my stairs?  Do not leave any items on the stairs.  Make sure that there are handrails on both sides of the stairs and use them. Fix handrails that are broken or loose. Make sure that handrails are as long as the stairways.  Check any carpeting to make sure that it is firmly attached to the stairs. Fix any carpet that is loose or worn.  Avoid having throw rugs at the top or bottom of the stairs. If you do have throw rugs, attach them to the floor with carpet tape.  Make sure that you have a light switch at the top of the stairs and the bottom of the stairs. If you do not have them, ask someone to add them for you. What else can I do to help prevent falls?  Wear shoes that:  Do not have high heels.  Have rubber bottoms.  Are comfortable and fit you well.  Are closed at the toe. Do not wear sandals.  If you use a stepladder:  Make sure that it is fully opened. Do not climb a closed stepladder.  Make sure that both sides of the stepladder are locked into place.  Ask someone to hold it for you, if possible.  Clearly mark and make sure that you can see:  Any grab bars or handrails.  First and last steps.  Where the edge of each step is.  Use tools that help you move around (mobility aids) if they are needed. These include:  Canes.  Walkers.  Scooters.  Crutches.  Turn on the lights when you go into a dark area. Replace any light bulbs as soon as they burn out.  Set up your furniture so you have a clear path. Avoid moving your furniture around.  If any of your floors are uneven, fix them.  If there are any pets around you, be aware of where they are.  Review your medicines with your doctor. Some medicines can make you feel dizzy. This can increase your chance of falling. Ask your  doctor what other things that you can do to help prevent falls. This information is not intended to replace advice given to you by your health care provider. Make sure you discuss any questions you have with your health care provider. Document Released: 10/03/2009 Document Revised: 05/14/2016 Document Reviewed: 01/11/2015 Elsevier Interactive Patient Education  2017 [employer].

## 2020-09-20 NOTE — Progress Notes (Signed)
I connected with Maria [name] today by telephone and verified that I am speaking with the correct person using two identifiers. Location patient: home Location provider: work Persons participating in the virtual visit: [employer] and [employer], LPN.   I discussed the limitations, risks, security and privacy concerns of performing an evaluation and management service by telephone and the availability of in person appointments. I also discussed with the patient that there may be a patient responsible charge related to this service. The patient expressed understanding and verbally consented to this telephonic visit.    Interactive audio and video telecommunications were attempted between this provider and patient, however failed, due to patient having technical difficulties OR patient did not have access to video capability.  We continued and completed visit with audio only.  Some vital signs may be absent or patient reported.   Time Spent with patient on telephone encounter: 30 minutes  Subjective:   Maria [name] is a 64 y.o. female who presents for Medicare Annual (Subsequent) preventive examination.  Review of Systems    No ROS. Medicare Wellness Visit. Cardiac Risk Factors include: family history of premature cardiovascular disease;hypertension;obesity (BMI >30kg/m2)     Objective:    Today's Vitals   09/20/20 1405 09/20/20 1436  BP:  139/67  Pulse:  71  SpO2:  98%  Weight:  (!) 364 lb (165.1 kg)  Height:  _0  (1.651 m)  PainSc: 6     Body mass index is 60.57 kg/m.  Advanced Directives 09/20/2020 08/06/2020 01/09/2020 09/26/2019 08/23/2019 12/31/2018 12/30/2018  Does Patient Have a Medical Advance Directive? _1  No No  Type of Advance Directive - - - - - - -  Does patient want to make changes to medical advance directive? - - - - - - Yes (ED - Information included in AVS)  Copy of [place] in Chart? - - - - - - -  Would patient like  information on creating a medical advance directive? No - Patient declined - No - Patient declined No - Patient declined No - Patient declined - -  Pre-existing out of facility DNR order (yellow form or pink MOST form) - - - - - - -    Current Medications (verified) Outpatient Encounter Medications as of 09/20/2020  Medication Sig  . acetaminophen (TYLENOL) 325 MG tablet Take 650 mg by mouth every 6 (six) hours as needed for headache (pain).  . clonazePAM (KLONOPIN) 1 MG tablet TAKE 1 TABLET BY MOUTH 2 TIMES A DAY AS NEEDED FOR ANXIETY  . cyclobenzaprine (FLEXERIL) 10 MG tablet TAKE 1 TABLET BY MOUTH 2 TIMES A DAY AS NEEDED FOR MUSCLE SPASMS (Patient taking differently: Take 10 mg by mouth 2 (two) times daily as needed for muscle spasms. )  . furosemide (LASIX) 40 MG tablet Take 1 tablet (40 mg total) by mouth daily. (Patient taking differently: Take 80 mg by mouth daily. )  . losartan (COZAAR) 100 MG tablet TAKE 1 TABLET BY MOUTH DAILY  . metolazone (ZAROXOLYN) 2.5 MG tablet Take by mouth.  . metroNIDAZOLE (METROCREAM) 0.75 % cream Apply topically 2 (two) times daily.  Marland Kitchen NARCAN 4 MG/0.1ML LIQD nasal spray kit Place 0.4 mg into the nose daily as needed (opioid overdose).   . ondansetron (ZOFRAN ODT) 4 MG disintegrating tablet Take 1 tablet (4 mg total) by mouth every 4 (four) hours as needed for nausea or vomiting.  . Oxycodone HCl 10 MG TABS Take 1  tablet (10 mg total) 4 (four) times daily by mouth. Per [facility] (Patient taking differently: Take 10 mg by mouth every 4 (four) hours as needed (pain). Per [facility])  . pantoprazole (PROTONIX) 40 MG tablet TAKE 1 TABLET BY MOUTH ONCE DAILY  . potassium chloride SA (KLOR-CON) 20 MEQ tablet potassium chloride ER 20 mEq tablet,extended release(part/cryst)  TAKE 1 TABLET BY MOUTH DAILY  . rOPINIRole (REQUIP) 3 MG tablet TAKE 1 TABLET BY MOUTH NIGHTLY AT BEDTIME AS NEEDED  . sertraline (ZOLOFT) 100 MG tablet TAKE 1 & ONE-HALF  TABLET BY MOUTH EVERY DAY  . Tetrahydrozoline HCl (VISINE OP) Place 1 drop into both eyes daily as needed (dry eyes).   . triamcinolone (NASACORT) 55 MCG/ACT AERO nasal inhaler Place 2 sprays into the nose daily. (Patient taking differently: Place 2 sprays into the nose daily as needed (sneezing/runny nose). )  . vitamin B-12 (CYANOCOBALAMIN) 1000 MCG tablet Take 1 tablet (1,000 mcg total) by mouth daily.  Marland Kitchen LINZESS 145 MCG CAPS capsule Take 145 mcg by mouth daily.  . [DISCONTINUED] DULoxetine (CYMBALTA) 60 MG capsule Take 60 mg by mouth daily.    . [DISCONTINUED] fexofenadine (ALLEGRA) 180 MG tablet Take 180 mg by mouth daily.    . [DISCONTINUED] potassium chloride (K-DUR) 10 MEQ tablet TAKE 1 TABLET BY MOUTH DAILY (Patient not taking: Reported on 09/20/2020)  . [DISCONTINUED] Vitamin D, Ergocalciferol, (DRISDOL) 1.25 MG (50000 UNIT) CAPS capsule Take 1 capsule (50,000 Units total) by mouth every 7 (seven) days. (Patient not taking: Reported on 09/20/2020)   No facility-administered encounter medications on file as of 09/20/2020.    Allergies (verified) Amoxicillin-pot clavulanate, Adhesive [tape], Codeine, Crestor [rosuvastatin calcium], Lactose intolerance (gi), Morphine and related, Vicodin [hydrocodone-acetaminophen], and Latex   History: Past Medical History:  Diagnosis Date  . Acute lymphadenitis 2011  . ALLERGIC RHINITIS 08/10/2007  . ANXIETY 12/06/2007  . Atherosclerotic peripheral vascular disease ([place]) 06/13/2013   Aorta on CT June 2014  . BACK PAIN 12/06/2007  . Cellulitis 2011  . Cervical disc disease 03/09/2012  . Chronic pain 03/09/2012  . DEPRESSION 12/06/2007  . DIVERTICULOSIS, [place] 12/06/2007  . GERD 12/06/2007  . Hepatitis    age 36 hepatitis A  . HLD (hyperlipidemia) 05/24/2019  . HYPERTENSION 12/06/2007  . Impaired glucose tolerance 03/13/2012  . LOW BACK PAIN 12/06/2007  . Lumbar disc disease 03/09/2012  . Mesenteric adenitis   . MRSA 2006  . Pain    by breast area   . Pain in joint, multiple sites 04/21/2010  . PARONYCHIA, FINGER 08/13/2009  . Pneumonia sept 2015   walking pneumonia, sepsis   . RASH-NONVESICULAR 05/09/2008  . 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 SHOULDER PAIN, LEFT 05/09/2008  . SINUSITIS- ACUTE-NOS 05/09/2008  . THORACIC/LUMBOSACRAL NEURITIS/RADICULITIS UNSPEC 12/28/2008   Past Surgical History:  Procedure Laterality Date  . ABDOMINAL HYSTERECTOMY  1999   1 ovary left  . bloo clot removed from neck   may 25th , june 2. 2010  . [place] SURGERY  may 24th 2010  . COLONOSCOPY WITH PROPOFOL N/A 07/10/2016   Procedure: COLONOSCOPY WITH PROPOFOL;  Surgeon: Manus Gunning, MD;  Location: [facility];  Service: Gastroenterology;  Laterality: N/A;  . COLONOSCOPY WITH PROPOFOL N/A 09/26/2019   Procedure: COLONOSCOPY WITH PROPOFOL;  Surgeon: Yetta Flock, MD;  Location: [facility];  Service: Gastroenterology;  Laterality: N/A;  . ESOPHAGOGASTRODUODENOSCOPY (EGD) WITH PROPOFOL N/A 07/10/2016   Procedure: ESOPHAGOGASTRODUODENOSCOPY (EGD) WITH PROPOFOL;  Surgeon: Renelda Loma  Havery Moros, MD;  Location: [facility];  Service: Gastroenterology;  Laterality: N/A;  . POLYPECTOMY  09/26/2019   Procedure: POLYPECTOMY;  Surgeon: Yetta Flock, MD;  Location: [facility];  Service: Gastroenterology;;  . s/p ovary cyst    . s/p right knee arthroscopy     Dr. Mardelle Matte ortho   Family History  Problem Relation Age of Onset  . Heart disease Mother   . Hypertension Mother   . Diabetes Mother   . Heart failure Mother   . Asthma Sister   . Anxiety disorder Sister   . Depression Sister   . Hypertension Father   . Asthma Daughter   . Bipolar disorder Daughter   . Cancer Maternal Uncle        [place]  . Cancer Other        ovarian  . Liver cancer Paternal Grandmother        ????   Social History   Socioeconomic History  . Marital status: Divorced    Spouse name: Not on file  . Number of children: 2  . Years of  education: Not on file  . Highest education level: Not on file  Occupational History  . Occupation: disabled  Tobacco Use  . Smoking status: Former Smoker    Years: 30.00    Quit date: 11/08/2008    Years since quitting: 11.8  . Smokeless tobacco: Never Used  . Tobacco comment: quit 10/09  Vaping Use  . Vaping Use: Never used  Substance and Sexual Activity  . Alcohol use: No    Comment: quit drinking 10/07/1997  . Drug use: No  . Sexual activity: Not Currently    Birth control/protection: Surgical, Abstinence  Other Topics Concern  . Not on file  Social History Narrative   Patient has difficulty financially affording food, medication, and affording essential bills. She states she has limited support from her daughter.    Social Determinants of Health   Financial Resource Strain: Medium Risk  . Difficulty of Paying Living Expenses: Somewhat hard  Food Insecurity: Food Insecurity Present  . Worried About Charity fundraiser in the Last Year: Sometimes true  . Ran Out of Food in the Last Year: Sometimes true  Transportation Needs: No Transportation Needs  . Lack of Transportation (Medical): No  . Lack of Transportation (Non-Medical): No  Physical Activity: Inactive  . Days of Exercise per Week: 0 days  . Minutes of Exercise per Session: 0 min  Stress: No Stress Concern Present  . Feeling of Stress : Not at all  Social Connections: Unknown  . Frequency of Communication with Friends and Family: More than three times a week  . Frequency of Social Gatherings with Friends and Family: More than three times a week  . Attends Religious Services: Patient refused  . Active Member of Clubs or Organizations: Patient refused  . Attends Archivist Meetings: Patient refused  . Marital Status: Never married    Tobacco Counseling Counseling given: Not Answered Comment: quit 10/09   Clinical Intake:  Pre-visit preparation completed: Yes  Pain : 0-10 Pain Score: 6  Pain  Type: Chronic pain Pain Location: Abdomen Pain Radiating Towards: umbilical hernia Pain Descriptors / Indicators: Constant Pain Onset: More than a month ago Pain Frequency: Constant Pain Relieving Factors: Pain medication  Pain Relieving Factors: Pain medication  Nutritional Risks: None Diabetes: No  How often do you need to have someone help you when you read instructions, pamphlets, or other written materials from  your doctor or pharmacy?: 1 - Never What is the last grade level you completed in school?: Associate's Degree  Diabetic?no  Interpreter Needed?: No  Information entered by :: Maria Powless N. Adeleine Pask, LPN   Activities of Daily Living In your present state of health, do you have any difficulty performing the following activities: 09/20/2020 01/09/2020  Hearing? N N  Vision? N N  Difficulty concentrating or making decisions? N N  Walking or climbing stairs? N Y  Comment - Impaired balance and gait.  Dressing or bathing? N N  Doing errands, shopping? N N  Preparing Food and eating ? N N  Using the Toilet? N N  In the past six months, have you accidently leaked urine? N N  Do you have problems with loss of bowel control? N N  Managing your Medications? N N  Managing your Finances? Y N  Housekeeping or managing your Housekeeping? N N  Some recent data might be hidden    Patient Care Team: Biagio Borg, MD as PCP - General (Internal Medicine) Helayne Seminole, MD as Consulting Physician (Otolaryngology) Artis Delay, MD as Referring Physician (Internal Medicine)  Indicate any recent Medical Services you may have received from other than [facility] providers in the past year (date may be approximate).     Assessment:   This is a routine wellness examination for Maria [name].  Hearing/Vision screen No exam data present  Dietary issues and exercise activities discussed: Current Exercise Habits: The patient does not participate in regular exercise at present, Exercise  limited by: orthopedic condition(s);psychological condition(s)  Goals    . Patient Stated     Start to monitor diet to decrease carbohydrates, sugar and fat. Start to be more active by doing chair exercises and walking.     . Patient Stated     I want to continue to crochet and play magong. Do chair exercises and eat healthy.      Depression Screen PHQ 2/9 Scores 09/20/2020 09/10/2020 05/01/2020 01/31/2020 12/30/2018 12/29/2017 11/08/2017  PHQ - 2 Score 0 0 0 0 _0 PHQ- 9 Score 0 0 4 - 7 10 -    Fall Risk Fall Risk  09/20/2020 05/01/2020 01/31/2020 01/09/2020 12/30/2018  Falls in the past year? 0 0 0 1 1  Number falls in past yr: 0 0 - 1 1  Comment - - - - -  Injury with Fall? 0 0 - 0 1  Risk Factor Category  - - - - -  Risk for fall due to : No Fall Risks History of fall(s) - History of fall(s);Impaired balance/gait;Impaired mobility Impaired balance/gait;Impaired mobility  Follow up Falls evaluation completed Falls evaluation completed - Education provided;Falls prevention discussed Falls prevention discussed;Education provided    Any stairs in or around the home? No  If so, are there any without handrails? No  Home free of loose throw rugs in walkways, pet beds, electrical cords, etc? Yes  Adequate lighting in your home to reduce risk of falls? Yes   ASSISTIVE DEVICES UTILIZED TO PREVENT FALLS:  Life alert? No  Use of a cane, walker or w/c? No  Grab bars in the bathroom? No  Shower chair or bench in shower? Yes  Elevated toilet seat or a handicapped toilet? No   TIMED UP AND GO:  Was the test performed? No .  Length of time to ambulate 10 feet: 0 sec.   Gait steady and fast without use of assistive device  Cognitive Function: MMSE -  Mini Mental State Exam 12/29/2017  Orientation to time 5  Orientation to Place 5  Registration 3  Attention/ Calculation 5  Recall 2  Language- name 2 objects 2  Language- repeat 1  Language- follow 3 step command 3  Language- read &  follow direction 1  Write a sentence 1  Copy design 1  Total score 29        Immunizations Immunization History  Administered Date(s) Administered  . Hepatitis B, ped/adol 06/03/2016, 07/17/2016  . Influenza Split 10/13/2012  . Influenza,inj,Quad PF,6+ Mos 08/31/2013, 11/08/2017, 12/30/2018  . Pneumococcal Polysaccharide-23 08/18/2013  . Td 12/21/2004  . Tdap 11/08/2017    TDAP status: Up to date Flu Vaccine status: Declined, Education has been provided regarding the importance of this vaccine but patient still declined. Advised may receive this vaccine at local pharmacy or Health Dept. Aware to provide a copy of the vaccination record if obtained from local pharmacy or Health Dept. Verbalized acceptance and understanding. Pneumococcal vaccine status: Up to date Covid-19 vaccine status: Declined, Education has been provided regarding the importance of this vaccine but patient still declined. Advised may receive this vaccine at local pharmacy or Health Dept.or vaccine clinic. Aware to provide a copy of the vaccination record if obtained from local pharmacy or Health Dept. Verbalized acceptance and understanding.  Qualifies for Shingles Vaccine? Yes   Zostavax completed No   Shingrix Completed?: No.    Education has been provided regarding the importance of this vaccine. Patient has been advised to call insurance company to determine out of pocket expense if they have not yet received this vaccine. Advised may also receive vaccine at local pharmacy or Health Dept. Verbalized acceptance and understanding.  Screening Tests Health Maintenance  Topic Date Due  . COVID-19 Vaccine (1) Never done  . INFLUENZA VACCINE  07/21/2020  . MAMMOGRAM  10/03/2021  . TETANUS/TDAP  11/09/2027  . COLONOSCOPY  09/25/2029  . Hepatitis C Screening  Completed  . HIV Screening  Completed    Health Maintenance  Health Maintenance Due  Topic Date Due  . COVID-19 Vaccine (1) Never done  . INFLUENZA  VACCINE  07/21/2020    Colorectal cancer screening: Completed 09/26/2019. Repeat every 10 years Mammogram status: Completed 10/10/2019. Repeat every year Bone density status: never done  Lung Cancer Screening: (Low Dose CT Chest recommended if Age 70-80 years, 30 pack-year currently smoking OR have quit w/in 15years.) does not qualify.   Lung Cancer Screening Referral: no  Additional Screening:  Hepatitis C Screening: does qualify; Completed yes  Vision Screening: Recommended annual ophthalmology exams for early detection of glaucoma and other disorders of the eye. Is the patient up to date with their annual eye exam?  Yes  Who is the provider or what is the name of the office in which the patient attends annual eye exams? [facility] If pt is not established with a provider, would they like to be referred to a provider to establish care? No .   Dental Screening: Recommended annual dental exams for proper oral hygiene  Community Resource Referral / Chronic Care Management: CRR required this visit?  No   CCM required this visit?  No      Plan:     I have personally reviewed and noted the following in the patient's chart:   . Medical and social history . Use of alcohol, tobacco or illicit drugs  . Current medications and supplements . Functional ability and status . Nutritional status .  Physical activity . Advanced directives . List of other physicians . Hospitalizations, surgeries, and ER visits in previous 12 months . Vitals . Screenings to include cognitive, depression, and falls . Referrals and appointments  In addition, I have reviewed and discussed with patient certain preventive protocols, quality metrics, and best practice recommendations. A written personalized care plan for preventive services as well as general preventive health recommendations were provided to patient.     Sheral Flow, LPN   [id number]   Nurse Notes:  Patient is  cogitatively intact. There were no vitals filed for this visit. There is no height or weight on file to calculate BMI. Patient stated that she has no issues with gait or balance; does not use any assistive devices.

## 2020-09-23 DIAGNOSIS — R221 Localized swelling, mass and lump, neck: Secondary | ICD-10-CM | POA: Diagnosis not present

## 2020-09-24 ENCOUNTER — Telehealth: Payer: Self-pay | Admitting: Internal Medicine

## 2020-09-24 NOTE — Telephone Encounter (Signed)
Maria [name] 09/24/2020 Called pt regarding community resource referral received. Left message for pt to call me back, my info is [phone] please see ref notes for more details.  [place], Care Management

## 2020-09-26 ENCOUNTER — Other Ambulatory Visit: Payer: Self-pay

## 2020-09-26 ENCOUNTER — Ambulatory Visit (INDEPENDENT_AMBULATORY_CARE_PROVIDER_SITE_OTHER): Payer: Medicare Other | Admitting: Internal Medicine

## 2020-09-26 ENCOUNTER — Encounter: Payer: Self-pay | Admitting: Internal Medicine

## 2020-09-26 VITALS — BP 110/70 | HR 75 | Temp 98.9°F | Ht 65.0 in | Wt 364.0 lb

## 2020-09-26 DIAGNOSIS — I10 Essential (primary) hypertension: Secondary | ICD-10-CM | POA: Diagnosis not present

## 2020-09-26 DIAGNOSIS — K429 Umbilical hernia without obstruction or gangrene: Secondary | ICD-10-CM | POA: Diagnosis not present

## 2020-09-26 DIAGNOSIS — I700 Atherosclerosis of aorta: Secondary | ICD-10-CM | POA: Diagnosis not present

## 2020-09-26 DIAGNOSIS — R739 Hyperglycemia, unspecified: Secondary | ICD-10-CM

## 2020-09-26 DIAGNOSIS — K5900 Constipation, unspecified: Secondary | ICD-10-CM | POA: Diagnosis not present

## 2020-09-26 NOTE — Progress Notes (Signed)
Subjective:    Patient ID: Maria [name], female    DOB: 19-Jan-1956, 64 y.o.   MRN: [id number]  HPI   Here to f/u, c/o persistent mid abd pain with distension for several wks, recent CT with fat containing hernia. Pt denies chest pain, increased sob or doe, wheezing, orthopnea, PND, increased LE swelling, palpitations, dizziness or syncope.  Denies worsening reflux, dysphagia, n/v, bowel change or blood, and consipation improved,  Recent sinusitis improved with doxy course as well.  Leg swelling better on increased lasix 80 mg   Pt denies fever, wt loss, night sweats, loss of appetite, or other constitutional symptoms  No other new complaints Past Medical History:  Diagnosis Date  . Acute lymphadenitis 2011  . ALLERGIC RHINITIS 08/10/2007  . ANXIETY 12/06/2007  . Atherosclerotic peripheral vascular disease ([employer]) 06/13/2013   Aorta on CT June 2014  . BACK PAIN 12/06/2007  . Cellulitis 2011  . Cervical disc disease 03/09/2012  . Chronic pain 03/09/2012  . DEPRESSION 12/06/2007  . DIVERTICULOSIS, [place] 12/06/2007  . GERD 12/06/2007  . Hepatitis    age 42 hepatitis A  . HLD (hyperlipidemia) 05/24/2019  . HYPERTENSION 12/06/2007  . Impaired glucose tolerance 03/13/2012  . LOW BACK PAIN 12/06/2007  . Lumbar disc disease 03/09/2012  . Mesenteric adenitis   . MRSA 2006  . Pain    by breast area  . Pain in joint, multiple sites 04/21/2010  . PARONYCHIA, FINGER 08/13/2009  . Pneumonia sept 2015   walking pneumonia, sepsis   . RASH-NONVESICULAR 05/09/2008  . 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 SHOULDER PAIN, LEFT 05/09/2008  . SINUSITIS- ACUTE-NOS 05/09/2008  . THORACIC/LUMBOSACRAL NEURITIS/RADICULITIS UNSPEC 12/28/2008   Past Surgical History:  Procedure Laterality Date  . ABDOMINAL HYSTERECTOMY  1999   1 ovary left  . bloo clot removed from neck   may 25th , june 2. 2010  . [place] SURGERY  may 24th 2010  . COLONOSCOPY WITH PROPOFOL N/A 07/10/2016   Procedure: COLONOSCOPY WITH  PROPOFOL;  Surgeon: Manus Gunning, MD;  Location: [facility];  Service: Gastroenterology;  Laterality: N/A;  . COLONOSCOPY WITH PROPOFOL N/A 09/26/2019   Procedure: COLONOSCOPY WITH PROPOFOL;  Surgeon: Yetta Flock, MD;  Location: [facility];  Service: Gastroenterology;  Laterality: N/A;  . ESOPHAGOGASTRODUODENOSCOPY (EGD) WITH PROPOFOL N/A 07/10/2016   Procedure: ESOPHAGOGASTRODUODENOSCOPY (EGD) WITH PROPOFOL;  Surgeon: Manus Gunning, MD;  Location: [facility];  Service: Gastroenterology;  Laterality: N/A;  . POLYPECTOMY  09/26/2019   Procedure: POLYPECTOMY;  Surgeon: Yetta Flock, MD;  Location: [facility];  Service: Gastroenterology;;  . s/p ovary cyst    . s/p right knee arthroscopy     Dr. Mardelle Matte ortho    reports that she quit smoking about 11 years ago. She quit after 30.00 years of use. She has never used smokeless tobacco. She reports that she does not drink alcohol and does not use drugs. family history includes Anxiety disorder in her sister; Asthma in her daughter and sister; Bipolar disorder in her daughter; Cancer in her maternal uncle and another family member; Depression in her sister; Diabetes in her mother; Heart disease in her mother; Heart failure in her mother; Hypertension in her father and mother; Liver cancer in her paternal grandmother. Allergies  Allergen Reactions  . Amoxicillin-Pot Clavulanate Nausea And Vomiting    Projectile vomiting Did it involve swelling of the face/tongue/throat, SOB, or low BP? No Did it involve sudden or severe rash/hives, skin peeling, or any  reaction on the inside of your mouth or nose? No Did you need to seek medical attention at a hospital or doctor's office? No When did it last happen?20-30 years If all above answers are "NO", may proceed with cephalosporin use.   . Adhesive [Tape] Other (See Comments)    Tears skin off - use paper tape   . Codeine Other (See Comments)    hallucinations  .  Crestor [Rosuvastatin Calcium]     Severe muscle cramps  . Lactose Intolerance (Gi) Nausea And Vomiting  . Morphine And Related Nausea And Vomiting and Other (See Comments)    Migraine headaches  . Vicodin [Hydrocodone-Acetaminophen] Other (See Comments)    hallucinations  . Latex Rash   Current Outpatient Medications on File Prior to Visit  Medication Sig Dispense Refill  . acetaminophen (TYLENOL) 325 MG tablet Take 650 mg by mouth every 6 (six) hours as needed for headache (pain).    . clonazePAM (KLONOPIN) 1 MG tablet TAKE 1 TABLET BY MOUTH 2 TIMES A DAY AS NEEDED FOR ANXIETY 60 tablet 5  . Cyanocobalamin (B-12 PO) B-12    . cyclobenzaprine (FLEXERIL) 10 MG tablet TAKE 1 TABLET BY MOUTH 2 TIMES A DAY AS NEEDED FOR MUSCLE SPASMS (Patient taking differently: Take 10 mg by mouth 2 (two) times daily as needed for muscle spasms. ) 180 tablet 1  . furosemide (LASIX) 40 MG tablet Take 1 tablet (40 mg total) by mouth daily. (Patient taking differently: Take 80 mg by mouth daily. ) 90 tablet 3  . lactulose (CHRONULAC) 10 GM/15ML solution SMARTSIG:15 Milliliter(s) By Mouth Every 12 Hours PRN    . LINZESS 145 MCG CAPS capsule Take 145 mcg by mouth daily.    Marland Kitchen losartan (COZAAR) 100 MG tablet TAKE 1 TABLET BY MOUTH DAILY 90 tablet 1  . metolazone (ZAROXOLYN) 2.5 MG tablet Take by mouth.    . metroNIDAZOLE (METROCREAM) 0.75 % cream Apply topically 2 (two) times daily. 45 g 5  . NARCAN 4 MG/0.1ML LIQD nasal spray kit Place 0.4 mg into the nose daily as needed (opioid overdose).     . ondansetron (ZOFRAN ODT) 4 MG disintegrating tablet Take 1 tablet (4 mg total) by mouth every 4 (four) hours as needed for nausea or vomiting. 20 tablet 0  . Oxycodone HCl 10 MG TABS Take 1 tablet (10 mg total) 4 (four) times daily by mouth. Per [facility] (Patient taking differently: Take 10 mg by mouth every 4 (four) hours as needed (pain). Per [facility]) 30 tablet 0  . pantoprazole (PROTONIX) 40 MG  tablet TAKE 1 TABLET BY MOUTH ONCE DAILY 90 tablet 1  . potassium chloride (MICRO-K) 10 MEQ CR capsule Take 20 mEq by mouth daily.    Marland Kitchen rOPINIRole (REQUIP) 3 MG tablet TAKE 1 TABLET BY MOUTH NIGHTLY AT BEDTIME AS NEEDED 30 tablet 2  . sertraline (ZOLOFT) 100 MG tablet TAKE 1 & ONE-HALF TABLET BY MOUTH EVERY DAY 135 tablet 1  . Tetrahydrozoline HCl (VISINE OP) Place 1 drop into both eyes daily as needed (dry eyes).     . triamcinolone (NASACORT) 55 MCG/ACT AERO nasal inhaler Place 2 sprays into the nose daily. (Patient taking differently: Place 2 sprays into the nose daily as needed (sneezing/runny nose). ) 1 Inhaler 12  . vitamin B-12 (CYANOCOBALAMIN) 1000 MCG tablet Take 1 tablet (1,000 mcg total) by mouth daily. 90 tablet 3  . [DISCONTINUED] DULoxetine (CYMBALTA) 60 MG capsule Take 60 mg by mouth daily.      . [  DISCONTINUED] fexofenadine (ALLEGRA) 180 MG tablet Take 180 mg by mouth daily.       No current facility-administered medications on file prior to visit.   Review of Systems All otherwise neg per pt    Objective:   Physical Exam BP 110/70 (BP Location: Left Arm, Patient Position: Sitting, Cuff Size: Large)   Pulse 75   Temp 98.9 F (37.2 C) (Oral)   Ht '5\' 5"'  (1.651 m)   Wt (!) 364 lb (165.1 kg)   SpO2 97%   BMI 60.57 kg/m  VS noted,  Constitutional: Pt appears in NAD HENT: Head: NCAT.  Right Ear: External ear normal.  Left Ear: External ear normal.  Eyes: . Pupils are equal, round, and reactive to light. Conjunctivae and EOM are normal Nose: without d/c or deformity Neck: Neck supple. Gross normal ROM Cardiovascular: Normal rate and regular rhythm.   Pulmonary/Chest: Effort normal and breath sounds without rales or wheezing.  Abd:  Soft, ND, + BS, no organomegaly with persistent midline mid and upper abd discomfort Neurological: Pt is alert. At baseline orientation, motor grossly intact Skin: Skin is warm. No rashes, other new lesions, no LE edema Psychiatric: Pt  behavior is normal without agitation  All otherwise neg per pt  Lab Results  Component Value Date   WBC 7.2 09/10/2020   HGB 12.7 09/10/2020   HCT 37.6 09/10/2020   PLT 265 09/10/2020   GLUCOSE 107 (H) 09/10/2020   CHOL 188 01/31/2020   TRIG 140.0 01/31/2020   HDL 45.70 01/31/2020   LDLDIRECT 127.0 01/25/2019   LDLCALC 114 (H) 01/31/2020   ALT 20 09/10/2020   AST 19 09/10/2020   NA 141 09/10/2020   K 4.3 09/10/2020   CL 102 09/10/2020   CREATININE 1.05 (H) 09/10/2020   BUN 14 09/10/2020   CO2 31 09/10/2020   TSH 1.08 01/31/2020   INR 1.1 08/23/2019   HGBA1C 6.3 01/31/2020       Assessment & Plan:

## 2020-09-26 NOTE — Assessment & Plan Note (Addendum)
Only fat containing but quite tender, somewhat indurated, for genera surg referral  I spent 31 minutes in preparing to see the patient by review of recent labs, imaging and procedures, obtaining and reviewing separately obtained history, communicating with the patient and family or caregiver, ordering medications, tests or procedures, and documenting clinical information in the EHR including the differential Dx, treatment, and any further evaluation and other management of abd pain, constipation, htn, hyperglycemia, aorti atherosclerosis

## 2020-09-26 NOTE — Patient Instructions (Signed)
.  Please continue all other medications as before, and refills have been done if requested.  Please have the pharmacy call with any other refills you may need.  Please continue your efforts at being more active, low cholesterol diet, and weight control.  Please keep your appointments with your specialists as you may have planned  You will be contacted regarding the referral for: General [facility]urg[facility]

## 2020-09-27 DIAGNOSIS — M25512 Pain in left shoulder: Secondary | ICD-10-CM | POA: Diagnosis not present

## 2020-09-27 DIAGNOSIS — M79661 Pain in right lower leg: Secondary | ICD-10-CM | POA: Diagnosis not present

## 2020-09-27 DIAGNOSIS — Z79891 Long term (current) use of opiate analgesic: Secondary | ICD-10-CM | POA: Diagnosis not present

## 2020-09-27 DIAGNOSIS — M25551 Pain in right hip: Secondary | ICD-10-CM | POA: Diagnosis not present

## 2020-09-27 DIAGNOSIS — G8929 Other chronic pain: Secondary | ICD-10-CM | POA: Diagnosis not present

## 2020-09-27 DIAGNOSIS — G894 Chronic pain syndrome: Secondary | ICD-10-CM | POA: Diagnosis not present

## 2020-09-27 DIAGNOSIS — M545 Low back pain, unspecified: Secondary | ICD-10-CM | POA: Diagnosis not present

## 2020-09-28 ENCOUNTER — Encounter: Payer: Self-pay | Admitting: Internal Medicine

## 2020-09-28 NOTE — Assessment & Plan Note (Signed)
stable overall by history and exam, recent data reviewed with pt, and pt to continue medical treatment as before,  to f/u any worsening symptoms or concerns  

## 2020-09-28 NOTE — Assessment & Plan Note (Signed)
Improved,  to f/u any worsening symptoms or concerns 

## 2020-09-28 NOTE — Assessment & Plan Note (Signed)
For contd risk factor modification, has been statin intolerant

## 2020-10-12 DIAGNOSIS — M1611 Unilateral primary osteoarthritis, right hip: Secondary | ICD-10-CM | POA: Diagnosis not present

## 2020-10-23 DIAGNOSIS — M25551 Pain in right hip: Secondary | ICD-10-CM | POA: Diagnosis not present

## 2020-10-23 DIAGNOSIS — M545 Low back pain, unspecified: Secondary | ICD-10-CM | POA: Diagnosis not present

## 2020-10-23 DIAGNOSIS — M170 Bilateral primary osteoarthritis of knee: Secondary | ICD-10-CM | POA: Diagnosis not present

## 2020-10-25 DIAGNOSIS — J3089 Other allergic rhinitis: Secondary | ICD-10-CM | POA: Diagnosis not present

## 2020-10-25 DIAGNOSIS — M25512 Pain in left shoulder: Secondary | ICD-10-CM | POA: Diagnosis not present

## 2020-10-25 DIAGNOSIS — G894 Chronic pain syndrome: Secondary | ICD-10-CM | POA: Diagnosis not present

## 2020-10-25 DIAGNOSIS — G8929 Other chronic pain: Secondary | ICD-10-CM | POA: Diagnosis not present

## 2020-10-25 DIAGNOSIS — M545 Low back pain, unspecified: Secondary | ICD-10-CM | POA: Diagnosis not present

## 2020-10-25 DIAGNOSIS — M25561 Pain in right knee: Secondary | ICD-10-CM | POA: Diagnosis not present

## 2020-10-25 DIAGNOSIS — M1711 Unilateral primary osteoarthritis, right knee: Secondary | ICD-10-CM | POA: Diagnosis not present

## 2020-10-25 DIAGNOSIS — M79661 Pain in right lower leg: Secondary | ICD-10-CM | POA: Diagnosis not present

## 2020-10-25 DIAGNOSIS — Z79891 Long term (current) use of opiate analgesic: Secondary | ICD-10-CM | POA: Diagnosis not present

## 2020-10-25 DIAGNOSIS — M25551 Pain in right hip: Secondary | ICD-10-CM | POA: Diagnosis not present

## 2020-10-30 ENCOUNTER — Telehealth: Payer: Self-pay | Admitting: Internal Medicine

## 2020-10-30 NOTE — Telephone Encounter (Signed)
   Patient requesting refill for rOPINIRole (REQUIP) 3 MG tablet [place], [place] 102

## 2020-10-31 DIAGNOSIS — K429 Umbilical hernia without obstruction or gangrene: Secondary | ICD-10-CM | POA: Diagnosis not present

## 2020-10-31 DIAGNOSIS — M6208 Separation of muscle (nontraumatic), other site: Secondary | ICD-10-CM | POA: Diagnosis not present

## 2020-11-01 ENCOUNTER — Other Ambulatory Visit: Payer: Self-pay

## 2020-11-01 MED ORDER — ROPINIROLE HCL 3 MG PO TABS: ORAL_TABLET | ORAL | 2 refills | Status: DC

## 2020-11-05 ENCOUNTER — Telehealth (INDEPENDENT_AMBULATORY_CARE_PROVIDER_SITE_OTHER): Payer: Medicare Other | Admitting: Family Medicine

## 2020-11-05 DIAGNOSIS — J302 Other seasonal allergic rhinitis: Secondary | ICD-10-CM | POA: Diagnosis not present

## 2020-11-05 DIAGNOSIS — R059 Cough, unspecified: Secondary | ICD-10-CM | POA: Diagnosis not present

## 2020-11-05 DIAGNOSIS — R0982 Postnasal drip: Secondary | ICD-10-CM | POA: Diagnosis not present

## 2020-11-05 DIAGNOSIS — R062 Wheezing: Secondary | ICD-10-CM | POA: Diagnosis not present

## 2020-11-05 MED ORDER — BENZONATATE 100 MG PO CAPS
100.0000 mg | ORAL_CAPSULE | Freq: Three times a day (TID) | ORAL | 0 refills | Status: DC | PRN
Start: 2020-11-05 — End: 2020-12-31

## 2020-11-05 MED ORDER — ALBUTEROL SULFATE HFA 108 (90 BASE) MCG/ACT IN AERS
2.0000 | INHALATION_SPRAY | Freq: Four times a day (QID) | RESPIRATORY_TRACT | 0 refills | Status: DC | PRN
Start: 2020-11-05 — End: 2023-08-30

## 2020-11-05 NOTE — Patient Instructions (Signed)
-  I sent the medication(s) we discussed to your pharmacy: Meds ordered this encounter  Medications  . albuterol (PROAIR HFA) 108 (90 Base) MCG/ACT inhaler    Sig: Inhale 2 puffs into the lungs every 6 (six) hours as needed for wheezing or shortness of breath.    Dispense:  1 each    Refill:  0  . benzonatate (TESSALON PERLES) 100 MG capsule    Sig: Take 1 capsule (100 mg total) by mouth 3 (three) times daily as needed.    Dispense:  20 capsule    Refill:  0     I hope you are feeling better soon!  Seek in person care promptly if your symptoms worsen, new concerns arise or you are not improving with treatment.  It was nice to meet you today. I help [facility] out with telemedicine visits on Tuesdays and Thursdays and am available for visits on those days. If you have any concerns or questions following this visit please schedule a follow up visit with your Primary Care doctor or seek care at a local urgent care clinic to avoid delays in care.

## 2020-11-05 NOTE — Progress Notes (Signed)
Virtual Visit via Telephone Note  I connected with Maria [name] on 11/05/20 at  5:40 PM EST by telephone and verified that I am speaking with the correct person using two identifiers.   I discussed the limitations, risks, security and privacy concerns of performing an evaluation and management service by telephone and the availability of in person appointments. I also discussed with the patient that there may be a patient responsible charge related to this service. The patient expressed understanding and agreed to proceed.  Location patient: home, [place] Location provider: work or home office Participants present for the call: patient, provider Patient did not have a visit with me in the prior 7 days to address this/these issue(s).   History of Present Illness:  Acute telemedicine visit for cough: -Onset: about 2 weeks ago -Symptoms include: cough, clear pnd, occ wheeze -covid test negative since this started -Denies: fevers, NVD, thick or bloody sputum, new SOB - reports does have some baseline SOB with activity due to poor activity level and weight gain per her report, CP -Pertinent past medical history: hx of wheezing and allergies (dust, mold and more) this time of the year, has seasonal allergies, has used alb in the past with this and is out of it -reports is undergoing allergy workup currently and is not supposed to take allergy pill or prednisone -Pertinent medication allergies:see allergy  -COVID-19 vaccine status: fully vaccinated   Observations/Objective: Patient sounds cheerful and well on the phone. I do not appreciate any SOB. Speech and thought processing are grossly intact. Patient reported vitals:  Assessment and Plan:  Seasonal allergies  Cough  PND (post-nasal drip)  Wheeze  -we discussed possible serious and likely etiologies, options for evaluation and workup, limitations of telemedicine visit vs in person visit, treatment, treatment risks and precautions.  Pt prefers to treat via telemedicine empirically rather than in person at this moment.  She feels this is likely her underlying allergies and currently her allergist has requested that she be off of all her from allergy medications for testing.  She is requesting a refill of her albuterol, sent.  We also sent Tessalon for the cough.  Could use nasal saline as well.  Recommended follow-up with her allergist, primary care doctor or in person care if worsening, new symptoms arise, or if is not improving with treatment. Advised of options for inperson care in case PCP office not available. Did let the patient know that I only do telemedicine shifts for [facility] on Tuesdays and Thursdays and advised a follow up visit with PCP or at an [facility] if has further questions or concerns.   Follow Up Instructions:  I did not refer this patient for an OV with me in the next 24 hours for this/these issue(s).  I discussed the assessment and treatment plan with the patient. The patient was provided an opportunity to ask questions and all were answered. The patient agreed with the plan and demonstrated an understanding of the instructions.   I spent 18  minutes on this encounter.   Lucretia Kern, DO

## 2020-11-13 DIAGNOSIS — R600 Localized edema: Secondary | ICD-10-CM | POA: Diagnosis not present

## 2020-11-26 DIAGNOSIS — M542 Cervicalgia: Secondary | ICD-10-CM | POA: Diagnosis not present

## 2020-11-26 DIAGNOSIS — J3089 Other allergic rhinitis: Secondary | ICD-10-CM | POA: Diagnosis not present

## 2020-11-26 DIAGNOSIS — M25551 Pain in right hip: Secondary | ICD-10-CM | POA: Diagnosis not present

## 2020-11-26 DIAGNOSIS — M545 Low back pain, unspecified: Secondary | ICD-10-CM | POA: Diagnosis not present

## 2020-11-26 DIAGNOSIS — M25512 Pain in left shoulder: Secondary | ICD-10-CM | POA: Diagnosis not present

## 2020-11-26 DIAGNOSIS — Z79891 Long term (current) use of opiate analgesic: Secondary | ICD-10-CM | POA: Diagnosis not present

## 2020-11-26 DIAGNOSIS — M79661 Pain in right lower leg: Secondary | ICD-10-CM | POA: Diagnosis not present

## 2020-11-26 DIAGNOSIS — G8929 Other chronic pain: Secondary | ICD-10-CM | POA: Diagnosis not present

## 2020-11-26 DIAGNOSIS — G894 Chronic pain syndrome: Secondary | ICD-10-CM | POA: Diagnosis not present

## 2020-11-27 DIAGNOSIS — J3089 Other allergic rhinitis: Secondary | ICD-10-CM | POA: Diagnosis not present

## 2020-11-28 DIAGNOSIS — J3089 Other allergic rhinitis: Secondary | ICD-10-CM | POA: Diagnosis not present

## 2020-12-10 ENCOUNTER — Other Ambulatory Visit: Payer: Self-pay | Admitting: Internal Medicine

## 2020-12-17 DIAGNOSIS — E559 Vitamin D deficiency, unspecified: Secondary | ICD-10-CM | POA: Diagnosis not present

## 2020-12-17 DIAGNOSIS — R5383 Other fatigue: Secondary | ICD-10-CM | POA: Diagnosis not present

## 2020-12-25 DIAGNOSIS — Z1231 Encounter for screening mammogram for malignant neoplasm of breast: Secondary | ICD-10-CM | POA: Diagnosis not present

## 2020-12-27 DIAGNOSIS — G894 Chronic pain syndrome: Secondary | ICD-10-CM | POA: Diagnosis not present

## 2020-12-27 DIAGNOSIS — M25551 Pain in right hip: Secondary | ICD-10-CM | POA: Diagnosis not present

## 2020-12-27 DIAGNOSIS — M25512 Pain in left shoulder: Secondary | ICD-10-CM | POA: Diagnosis not present

## 2020-12-27 DIAGNOSIS — G990 Autonomic neuropathy in diseases classified elsewhere: Secondary | ICD-10-CM | POA: Diagnosis not present

## 2020-12-27 DIAGNOSIS — G8929 Other chronic pain: Secondary | ICD-10-CM | POA: Diagnosis not present

## 2020-12-27 DIAGNOSIS — Z79891 Long term (current) use of opiate analgesic: Secondary | ICD-10-CM | POA: Diagnosis not present

## 2020-12-27 DIAGNOSIS — M25552 Pain in left hip: Secondary | ICD-10-CM | POA: Diagnosis not present

## 2020-12-27 DIAGNOSIS — M542 Cervicalgia: Secondary | ICD-10-CM | POA: Diagnosis not present

## 2020-12-27 DIAGNOSIS — M1711 Unilateral primary osteoarthritis, right knee: Secondary | ICD-10-CM | POA: Diagnosis not present

## 2020-12-27 DIAGNOSIS — M545 Low back pain, unspecified: Secondary | ICD-10-CM | POA: Diagnosis not present

## 2020-12-27 DIAGNOSIS — J3089 Other allergic rhinitis: Secondary | ICD-10-CM | POA: Diagnosis not present

## 2020-12-27 DIAGNOSIS — M25561 Pain in right knee: Secondary | ICD-10-CM | POA: Diagnosis not present

## 2020-12-27 DIAGNOSIS — J321 Chronic frontal sinusitis: Secondary | ICD-10-CM | POA: Diagnosis not present

## 2020-12-31 ENCOUNTER — Ambulatory Visit (INDEPENDENT_AMBULATORY_CARE_PROVIDER_SITE_OTHER): Payer: Medicare Other | Admitting: Internal Medicine

## 2020-12-31 ENCOUNTER — Encounter: Payer: Self-pay | Admitting: Internal Medicine

## 2020-12-31 ENCOUNTER — Other Ambulatory Visit: Payer: Self-pay

## 2020-12-31 VITALS — BP 142/70 | HR 76 | Temp 98.7°F | Wt 372.0 lb

## 2020-12-31 DIAGNOSIS — E559 Vitamin D deficiency, unspecified: Secondary | ICD-10-CM | POA: Diagnosis not present

## 2020-12-31 DIAGNOSIS — Z23 Encounter for immunization: Secondary | ICD-10-CM | POA: Diagnosis not present

## 2020-12-31 DIAGNOSIS — M7701 Medial epicondylitis, right elbow: Secondary | ICD-10-CM | POA: Diagnosis not present

## 2020-12-31 DIAGNOSIS — E7849 Other hyperlipidemia: Secondary | ICD-10-CM | POA: Diagnosis not present

## 2020-12-31 DIAGNOSIS — E538 Deficiency of other specified B group vitamins: Secondary | ICD-10-CM | POA: Diagnosis not present

## 2020-12-31 DIAGNOSIS — Z0000 Encounter for general adult medical examination without abnormal findings: Secondary | ICD-10-CM

## 2020-12-31 DIAGNOSIS — R739 Hyperglycemia, unspecified: Secondary | ICD-10-CM

## 2020-12-31 DIAGNOSIS — I10 Essential (primary) hypertension: Secondary | ICD-10-CM

## 2020-12-31 DIAGNOSIS — Z0001 Encounter for general adult medical examination with abnormal findings: Secondary | ICD-10-CM

## 2020-12-31 DIAGNOSIS — M7700 Medial epicondylitis, unspecified elbow: Secondary | ICD-10-CM | POA: Insufficient documentation

## 2020-12-31 LAB — LIPID PANEL
Cholesterol: 156 mg/dL (ref 0–200)
HDL: 46.9 mg/dL (ref >39.00)
LDL Cholesterol: 83 mg/dL (ref 0–99)
NonHDL: 109.13
Total CHOL/HDL Ratio: 3
Triglycerides: 133 mg/dL (ref 0.0–149.0)
VLDL: 26.6 mg/dL (ref 0.0–40.0)

## 2020-12-31 LAB — CBC WITH DIFFERENTIAL/PLATELET
Basophils Absolute: 0.1 10*3/uL (ref 0.0–0.1)
Basophils Relative: 0.7 % (ref 0.0–3.0)
Eosinophils Absolute: 0.1 10*3/uL (ref 0.0–0.7)
Eosinophils Relative: 1.1 % (ref 0.0–5.0)
HCT: 41.1 % (ref 36.0–46.0)
Hemoglobin: 13.5 g/dL (ref 12.0–15.0)
Lymphocytes Relative: 32.7 % (ref 12.0–46.0)
Lymphs Abs: 3.4 10*3/uL (ref 0.7–4.0)
MCHC: 32.8 g/dL (ref 30.0–36.0)
MCV: 91.1 fl (ref 78.0–100.0)
Monocytes Absolute: 0.8 10*3/uL (ref 0.1–1.0)
Monocytes Relative: 7.6 % (ref 3.0–12.0)
Neutro Abs: 6.1 10*3/uL (ref 1.4–7.7)
Neutrophils Relative %: 57.9 % (ref 43.0–77.0)
Platelets: 299 10*3/uL (ref 150.0–400.0)
RBC: 4.51 Mil/uL (ref 3.87–5.11)
RDW: 13.2 % (ref 11.5–15.5)
WBC: 10.5 10*3/uL (ref 4.0–10.5)

## 2020-12-31 LAB — BASIC METABOLIC PANEL
BUN: 12 mg/dL (ref 6–23)
CO2: 33 mEq/L — ABNORMAL HIGH (ref 19–32)
Calcium: 9.5 mg/dL (ref 8.4–10.5)
Chloride: 105 mEq/L (ref 96–112)
Creatinine, Ser: 0.99 mg/dL (ref 0.40–1.20)
GFR: 60.63 mL/min (ref >60.00)
Glucose, Bld: 78 mg/dL (ref 70–99)
Potassium: 4.7 mEq/L (ref 3.5–5.1)
Sodium: 142 mEq/L (ref 135–145)

## 2020-12-31 LAB — HEPATIC FUNCTION PANEL
ALT: 15 U/L (ref 0–35)
AST: 16 U/L (ref 0–37)
Albumin: 4.3 g/dL (ref 3.5–5.2)
Alkaline Phosphatase: 74 U/L (ref 39–117)
Bilirubin, Direct: 0.2 mg/dL (ref 0.0–0.3)
Total Bilirubin: 0.9 mg/dL (ref 0.2–1.2)
Total Protein: 7.4 g/dL (ref 6.0–8.3)

## 2020-12-31 LAB — HEMOGLOBIN A1C: Hgb A1c MFr Bld: 6.8 % — ABNORMAL HIGH (ref 4.6–6.5)

## 2020-12-31 LAB — VITAMIN B12: Vitamin B-12: 236 pg/mL (ref 211–911)

## 2020-12-31 LAB — TSH: TSH: 1.45 u[IU]/mL (ref 0.35–4.50)

## 2020-12-31 LAB — VITAMIN D 25 HYDROXY (VIT D DEFICIENCY, FRACTURES): VITD: 24.56 ng/mL — ABNORMAL LOW (ref 30.00–100.00)

## 2020-12-31 NOTE — Progress Notes (Signed)
Established Patient Office Visit  Subjective:  Patient ID: Maria [name], female    DOB: 03-07-1956  Age: 65 y.o. MRN: [id number]       Chief Complaint:: wellness exam and Arm Pain (Right )       HPI:  Maria [name] is a 65 y.o. female here for wellness exam      Pt is S/p covid vaccination x 2 in October in right arm,  Now with right medial epicondylar pain for at least 2.5 mo, mild, worse to use hand,, better to rest.  BP at home < 140/90.  Pt denies chest pain, increased sob or doe, wheezing, orthopnea, PND, increased LE swelling, palpitations, dizziness or syncope.  Pt denies new neurological symptoms such as new headache, or facial or extremity weakness or numbness   Pt denies polydipsia, polyuria  Wt Readings from Last 3 Encounters:  12/31/20 (!) 372 lb (168.7 kg)  09/26/20 (!) 364 lb (165.1 kg)  09/20/20 (!) 364 lb (165.1 kg)   BP Readings from Last 3 Encounters:  12/31/20 (!) 142/70  09/26/20 110/70  09/20/20 139/67     Past Medical History:  Diagnosis Date  . Acute lymphadenitis 2011  . ALLERGIC RHINITIS 08/10/2007  . ANXIETY 12/06/2007  . Atherosclerotic peripheral vascular disease ([place]) 06/13/2013   Aorta on CT June 2014  . BACK PAIN 12/06/2007  . Cellulitis 2011  . Cervical disc disease 03/09/2012  . Chronic pain 03/09/2012  . DEPRESSION 12/06/2007  . DIVERTICULOSIS, [place] 12/06/2007  . GERD 12/06/2007  . Hepatitis    age 76 hepatitis A  . HLD (hyperlipidemia) 05/24/2019  . HYPERTENSION 12/06/2007  . Impaired glucose tolerance 03/13/2012  . LOW BACK PAIN 12/06/2007  . Lumbar disc disease 03/09/2012  . Mesenteric adenitis   . MRSA 2006  . Pain    by breast area  . Pain in joint, multiple sites 04/21/2010  . PARONYCHIA, FINGER 08/13/2009  . Pneumonia sept 2015   walking pneumonia, sepsis   . RASH-NONVESICULAR 05/09/2008  . 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 SHOULDER PAIN, LEFT 05/09/2008  . SINUSITIS- ACUTE-NOS 05/09/2008  . THORACIC/LUMBOSACRAL  NEURITIS/RADICULITIS UNSPEC 12/28/2008   Past Surgical History:  Procedure Laterality Date  . ABDOMINAL HYSTERECTOMY  1999   1 ovary left  . bloo clot removed from neck   may 25th , june 2. 2010  . [place] SURGERY  may 24th 2010  . COLONOSCOPY WITH PROPOFOL N/A 07/10/2016   Procedure: COLONOSCOPY WITH PROPOFOL;  Surgeon: Manus Gunning, MD;  Location: [facility];  Service: Gastroenterology;  Laterality: N/A;  . COLONOSCOPY WITH PROPOFOL N/A 09/26/2019   Procedure: COLONOSCOPY WITH PROPOFOL;  Surgeon: Yetta Flock, MD;  Location: [facility];  Service: Gastroenterology;  Laterality: N/A;  . ESOPHAGOGASTRODUODENOSCOPY (EGD) WITH PROPOFOL N/A 07/10/2016   Procedure: ESOPHAGOGASTRODUODENOSCOPY (EGD) WITH PROPOFOL;  Surgeon: Manus Gunning, MD;  Location: [facility];  Service: Gastroenterology;  Laterality: N/A;  . POLYPECTOMY  09/26/2019   Procedure: POLYPECTOMY;  Surgeon: Yetta Flock, MD;  Location: [facility];  Service: Gastroenterology;;  . s/p ovary cyst    . s/p right knee arthroscopy     Dr. Mardelle Matte ortho    reports that she quit smoking about 12 years ago. She quit after 30.00 years of use. She has never used smokeless tobacco. She reports that she does not drink alcohol and does not use drugs. family history includes Anxiety disorder in her sister; Asthma in her daughter and sister; Bipolar disorder in  her daughter; Cancer in her maternal uncle and another family member; Depression in her sister; Diabetes in her mother; Heart disease in her mother; Heart failure in her mother; Hypertension in her father and mother; Liver cancer in her paternal grandmother. Allergies  Allergen Reactions  . Amoxicillin-Pot Clavulanate Nausea And Vomiting    Projectile vomiting Did it involve swelling of the face/tongue/throat, SOB, or low BP? No Did it involve sudden or severe rash/hives, skin peeling, or any reaction on the inside of your mouth or nose? No Did you  need to seek medical attention at a hospital or doctor's office? No When did it last happen?20-30 years If all above answers are "NO", may proceed with cephalosporin use.   . Adhesive [Tape] Other (See Comments)    Tears skin off - use paper tape   . Codeine Other (See Comments)    hallucinations  . Crestor [Rosuvastatin Calcium]     Severe muscle cramps  . Lactose Intolerance (Gi) Nausea And Vomiting  . Morphine And Related Nausea And Vomiting and Other (See Comments)    Migraine headaches  . Vicodin [Hydrocodone-Acetaminophen] Other (See Comments)    hallucinations  . Latex Rash   Current Outpatient Medications on File Prior to Visit  Medication Sig Dispense Refill  . acetaminophen (TYLENOL) 325 MG tablet Take 650 mg by mouth every 6 (six) hours as needed for headache (pain).    Marland Kitchen albuterol (PROAIR HFA) 108 (90 Base) MCG/ACT inhaler Inhale 2 puffs into the lungs every 6 (six) hours as needed for wheezing or shortness of breath. 1 each 0  . clonazePAM (KLONOPIN) 1 MG tablet TAKE 1 TABLET BY MOUTH 2 TIMES A DAY AS NEEDED FOR ANXIETY 60 tablet 5  . cyclobenzaprine (FLEXERIL) 10 MG tablet TAKE 1 TABLET BY MOUTH 2 TIMES A DAY AS NEEDED FOR MUSCLE SPASMS (Patient taking differently: Take 10 mg by mouth 2 (two) times daily as needed for muscle spasms.) 180 tablet 1  . furosemide (LASIX) 40 MG tablet Take 1 tablet (40 mg total) by mouth daily. (Patient taking differently: Take 80 mg by mouth daily.) 90 tablet 3  . LINZESS 145 MCG CAPS capsule Take 145 mcg by mouth daily.    Marland Kitchen losartan (COZAAR) 100 MG tablet TAKE 1 TABLET BY MOUTH DAILY 90 tablet 1  . metolazone (ZAROXOLYN) 2.5 MG tablet Take by mouth.    . ondansetron (ZOFRAN ODT) 4 MG disintegrating tablet Take 1 tablet (4 mg total) by mouth every 4 (four) hours as needed for nausea or vomiting. 20 tablet 0  . Oxycodone HCl 10 MG TABS Take 1 tablet (10 mg total) 4 (four) times daily by mouth. Per [facility] (Patient taking  differently: Take 10 mg by mouth every 4 (four) hours as needed (pain). Per [facility]) 30 tablet 0  . pantoprazole (PROTONIX) 40 MG tablet TAKE 1 TABLET BY MOUTH ONCE DAILY 90 tablet 1  . potassium chloride (MICRO-K) 10 MEQ CR capsule Take 20 mEq by mouth daily.    Marland Kitchen rOPINIRole (REQUIP) 3 MG tablet TAKE 1 TABLET BY MOUTH NIGHTLY AT BEDTIME AS NEEDED 30 tablet 2  . sertraline (ZOLOFT) 100 MG tablet TAKE 1 & ONE-HALF TABLET BY MOUTH EVERY DAY 135 tablet 1  . Tetrahydrozoline HCl (VISINE OP) Place 1 drop into both eyes daily as needed (dry eyes).     Karma Greaser 4 MG/0.1ML LIQD nasal spray kit Place 0.4 mg into the nose daily as needed (opioid overdose).  (Patient not taking:  Reported on 12/31/2020)    . [DISCONTINUED] DULoxetine (CYMBALTA) 60 MG capsule Take 60 mg by mouth daily.      . [DISCONTINUED] fexofenadine (ALLEGRA) 180 MG tablet Take 180 mg by mouth daily.       No current facility-administered medications on file prior to visit.        ROS:  All others reviewed and negative.  Objective        PE:  BP (!) 142/70 (BP Location: Left Arm)   Pulse 76   Temp 98.7 F (37.1 C) (Oral)   Wt (!) 372 lb (168.7 kg)   SpO2 97%   BMI 61.90 kg/m                 Constitutional: Pt appears in NAD               HENT: Head: NCAT.                Right Ear: External ear normal.                 Left Ear: External ear normal.                Eyes: . Pupils are equal, round, and reactive to light. Conjunctivae and EOM are normal               Nose: without d/c or deformity               Neck: Neck supple. Gross normal ROM               Cardiovascular: Normal rate and regular rhythm.                 Pulmonary/Chest: Effort normal and breath sounds without rales or wheezing.  Right arm with tender over medial epicondylar area without swelling               Abd:  Soft, NT, ND, + BS, no organomegaly               Neurological: Pt is alert. At baseline orientation, motor grossly intact                Skin: Skin is warm. No rashes, no other new lesions, LE edema - trace bilat               Psychiatric: Pt behavior is normal without agitation   Assessment/Plan:  Olar Santini Mcmannis is a 65 y.o. White or Caucasian [1] female with  has a past medical history of Acute lymphadenitis (2011), ALLERGIC RHINITIS (08/10/2007), ANXIETY (12/06/2007), Atherosclerotic peripheral vascular disease ([place]) (06/13/2013), BACK PAIN (12/06/2007), Cellulitis (2011), Cervical disc disease (03/09/2012), Chronic pain (03/09/2012), DEPRESSION (12/06/2007), DIVERTICULOSIS, [place] (12/06/2007), GERD (12/06/2007), Hepatitis, HLD (hyperlipidemia) (05/24/2019), HYPERTENSION (12/06/2007), Impaired glucose tolerance (03/13/2012), LOW BACK PAIN (12/06/2007), Lumbar disc disease (03/09/2012), Mesenteric adenitis, MRSA (2006), Pain, Pain in joint, multiple sites (04/21/2010), PARONYCHIA, FINGER (08/13/2009), Pneumonia (sept 2015), RASH-NONVESICULAR (05/09/2008), 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[place]) (11/08/2017), SHOULDER PAIN, LEFT (05/09/2008), SINUSITIS- ACUTE-NOS (05/09/2008), and THORACIC/LUMBOSACRAL NEURITIS/RADICULITIS UNSPEC (12/28/2008).   Assessment Plan  See notes Labs reviewed for each problem: Lab Results  Component Value Date   WBC 10.5 12/31/2020   HGB 13.5 12/31/2020   HCT 41.1 12/31/2020   PLT 299.0 12/31/2020   GLUCOSE 78 12/31/2020   CHOL 156 12/31/2020   TRIG 133.0 12/31/2020   HDL 46.90 12/31/2020   LDLDIRECT 127.0 01/25/2019   LDLCALC 83 12/31/2020   ALT 15 12/31/2020  AST 16 12/31/2020   NA 142 12/31/2020   K 4.7 12/31/2020   CL 105 12/31/2020   CREATININE 0.99 12/31/2020   BUN 12 12/31/2020   CO2 33 (H) 12/31/2020   TSH 1.45 12/31/2020   INR 1.1 08/23/2019   HGBA1C 6.8 (H) 12/31/2020    Micro: none  Cardiac tracings I have personally interpreted today:  none  Pertinent Radiological findings (summarize): none    There are no preventive care reminders to display for this patient.  There are no preventive care  reminders to display for this patient.  Lab Results  Component Value Date   TSH 1.45 12/31/2020   Lab Results  Component Value Date   WBC 10.5 12/31/2020   HGB 13.5 12/31/2020   HCT 41.1 12/31/2020   MCV 91.1 12/31/2020   PLT 299.0 12/31/2020   Lab Results  Component Value Date   NA 142 12/31/2020   K 4.7 12/31/2020   CO2 33 (H) 12/31/2020   GLUCOSE 78 12/31/2020   BUN 12 12/31/2020   CREATININE 0.99 12/31/2020   BILITOT 0.9 12/31/2020   ALKPHOS 74 12/31/2020   AST 16 12/31/2020   ALT 15 12/31/2020   PROT 7.4 12/31/2020   ALBUMIN 4.3 12/31/2020   CALCIUM 9.5 12/31/2020   ANIONGAP 9 08/06/2020   GFR 60.63 12/31/2020   Lab Results  Component Value Date   CHOL 156 12/31/2020   Lab Results  Component Value Date   HDL 46.90 12/31/2020   Lab Results  Component Value Date   LDLCALC 83 12/31/2020   Lab Results  Component Value Date   TRIG 133.0 12/31/2020   Lab Results  Component Value Date   CHOLHDL 3 12/31/2020   Lab Results  Component Value Date   HGBA1C 6.8 (H) 12/31/2020      Assessment & Plan:   Problem List Items Addressed This Visit      High   Encounter for well adult exam with abnormal findings - Primary    Overall doing well, age appropriate education and counseling updated, referrals for preventative services and immunizations addressed, dietary and smoking counseling addressed, most recent labs reviewed.  I have personally reviewed and have noted:  1) the patient's medical and social history 2) The pt's use of alcohol, tobacco, and illicit drugs 3) The patient's current medications and supplements 4) Functional ability including ADL's, fall risk, home safety risk, hearing and visual impairment 5) Diet and physical activities 6) Evidence for depression or mood disorder 7) The patient's height, weight, and BMI have been recorded in the chart  I have made referrals, and provided counseling and education based on review of the above        Relevant Orders   Lipid panel (Completed)   Hepatic function panel (Completed)   CBC with Differential/Platelet (Completed)   TSH (Completed)   Urinalysis, Routine w reflex microscopic (Completed)   Basic metabolic panel (Completed)     Medium   Vitamin D deficiency    Last vitamin D Lab Results  Component Value Date   VD25OH 24.56 (L) 12/31/2020   Stable, cont oral replacement      Relevant Orders   VITAMIN D 25 Hydroxy (Vit-D Deficiency, Fractures) (Completed)   Vitamin B 12 deficiency    Lab Results  Component Value Date   VITAMINB12 236 12/31/2020   Stable, cont oral replacement - b12 1000 mcg qd      Medial epicondylitis    Mild, for volt gel topical prn, rest,  to f/u any worsening symptoms or concerns      Hyperglycemia    Lab Results  Component Value Date   HGBA1C 6.8 (H) 12/31/2020   Stable, pt to continue current medical treatment  - diet   Current Outpatient Medications (Cardiovascular):  .  furosemide (LASIX) 40 MG tablet, Take 1 tablet (40 mg total) by mouth daily. (Patient taking differently: Take 80 mg by mouth daily.) .  losartan (COZAAR) 100 MG tablet, TAKE 1 TABLET BY MOUTH DAILY .  metolazone (ZAROXOLYN) 2.5 MG tablet, Take by mouth.  Current Outpatient Medications (Respiratory):  .  albuterol (PROAIR HFA) 108 (90 Base) MCG/ACT inhaler, Inhale 2 puffs into the lungs every 6 (six) hours as needed for wheezing or shortness of breath.  Current Outpatient Medications (Analgesics):  .  acetaminophen (TYLENOL) 325 MG tablet, Take 650 mg by mouth every 6 (six) hours as needed for headache (pain). .  Oxycodone HCl 10 MG TABS, Take 1 tablet (10 mg total) 4 (four) times daily by mouth. Per [facility] (Patient taking differently: Take 10 mg by mouth every 4 (four) hours as needed (pain). Per [facility])   Current Outpatient Medications (Other):  .  clonazePAM (KLONOPIN) 1 MG tablet, TAKE 1 TABLET BY MOUTH 2 TIMES A DAY AS NEEDED FOR  ANXIETY .  cyclobenzaprine (FLEXERIL) 10 MG tablet, TAKE 1 TABLET BY MOUTH 2 TIMES A DAY AS NEEDED FOR MUSCLE SPASMS (Patient taking differently: Take 10 mg by mouth 2 (two) times daily as needed for muscle spasms.) .  LINZESS 145 MCG CAPS capsule, Take 145 mcg by mouth daily. .  ondansetron (ZOFRAN ODT) 4 MG disintegrating tablet, Take 1 tablet (4 mg total) by mouth every 4 (four) hours as needed for nausea or vomiting. .  pantoprazole (PROTONIX) 40 MG tablet, TAKE 1 TABLET BY MOUTH ONCE DAILY .  potassium chloride (MICRO-K) 10 MEQ CR capsule, Take 20 mEq by mouth daily. Marland Kitchen  rOPINIRole (REQUIP) 3 MG tablet, TAKE 1 TABLET BY MOUTH NIGHTLY AT BEDTIME AS NEEDED .  sertraline (ZOLOFT) 100 MG tablet, TAKE 1 & ONE-HALF TABLET BY MOUTH EVERY DAY .  Tetrahydrozoline HCl (VISINE OP), Place 1 drop into both eyes daily as needed (dry eyes).  Karma Greaser 4 MG/0.1ML LIQD nasal spray kit, Place 0.4 mg into the nose daily as needed (opioid overdose).  (Patient not taking: Reported on 12/31/2020)      Relevant Orders   Hemoglobin A1c (Completed)   HLD (hyperlipidemia)    Lab Results  Component Value Date   LDLCALC 83 12/31/2020   Stable, pt to continue to decline statin       Essential hypertension    BP at recent pain clinic 119/68, today is high as did not take the losartan today, but usually takes every day        Other Visit Diagnoses    B12 deficiency       Relevant Orders   Vitamin B12 (Completed)      No orders of the defined types were placed in this encounter.   Follow-up: Return in about 6 months (around 06/30/2021).   Cathlean Cower, MD 12/31/2020 1:39 PM [facility]

## 2020-12-31 NOTE — Patient Instructions (Signed)
Ok to use the voltaren gel, and rest for the right medial epicondylitis  Please continue all other medications as before, and refills have been done if requested.  Please have the pharmacy call with any other refills you may need.  Please continue your efforts at being more active, low cholesterol diet, and weight control.  You are otherwise up to date with prevention measures today.  Please keep your appointments with your specialists as you may have planned  Please go to the LAB at the blood drawing area for the tests to be done  You will be contacted by phone if any changes need to be made immediately.  Otherwise, you will receive a letter about your results with an explanation, but please check with MyChart first.  Please remember to sign up for MyChart if you have not done so, as this will be important to you in the future with finding out test results, communicating by private email, and scheduling acute appointments online when needed.  Please make an Appointment to return in 6 months, or sooner if needed

## 2020-12-31 NOTE — Assessment & Plan Note (Signed)
BP at recent pain clinic 119/68, today is high as did not take the losartan today, but usually takes every day

## 2021-01-01 ENCOUNTER — Encounter: Payer: Self-pay | Admitting: Internal Medicine

## 2021-01-01 DIAGNOSIS — E559 Vitamin D deficiency, unspecified: Secondary | ICD-10-CM | POA: Insufficient documentation

## 2021-01-01 LAB — URINALYSIS, ROUTINE W REFLEX MICROSCOPIC
Bilirubin Urine: NEGATIVE
Ketones, ur: NEGATIVE
Leukocytes,Ua: NEGATIVE
Nitrite: NEGATIVE
Specific Gravity, Urine: 1.015 (ref 1.000–1.030)
Total Protein, Urine: NEGATIVE
Urine Glucose: NEGATIVE
Urobilinogen, UA: 0.2 (ref 0.0–1.0)
pH: 7.5 (ref 5.0–8.0)

## 2021-01-05 ENCOUNTER — Encounter: Payer: Self-pay | Admitting: Internal Medicine

## 2021-01-05 NOTE — Assessment & Plan Note (Signed)
Lab Results  Component Value Date   VITAMINB12 236 12/31/2020   Stable, cont oral replacement - b12 1000 mcg qd

## 2021-01-05 NOTE — Assessment & Plan Note (Signed)
Lab Results  Component Value Date   HGBA1C 6.8 (H) 12/31/2020   Stable, pt to continue current medical treatment  - diet   Current Outpatient Medications (Cardiovascular):  .  furosemide (LASIX) 40 MG tablet, Take 1 tablet (40 mg total) by mouth daily. (Patient taking differently: Take 80 mg by mouth daily.) .  losartan (COZAAR) 100 MG tablet, TAKE 1 TABLET BY MOUTH DAILY .  metolazone (ZAROXOLYN) 2.5 MG tablet, Take by mouth.  Current Outpatient Medications (Respiratory):  .  albuterol (PROAIR HFA) 108 (90 Base) MCG/ACT inhaler, Inhale 2 puffs into the lungs every 6 (six) hours as needed for wheezing or shortness of breath.  Current Outpatient Medications (Analgesics):  .  acetaminophen (TYLENOL) 325 MG tablet, Take 650 mg by mouth every 6 (six) hours as needed for headache (pain). .  Oxycodone HCl 10 MG TABS, Take 1 tablet (10 mg total) 4 (four) times daily by mouth. Per [facility] (Patient taking differently: Take 10 mg by mouth every 4 (four) hours as needed (pain). Per [facility])   Current Outpatient Medications (Other):  .  clonazePAM (KLONOPIN) 1 MG tablet, TAKE 1 TABLET BY MOUTH 2 TIMES A DAY AS NEEDED FOR ANXIETY .  cyclobenzaprine (FLEXERIL) 10 MG tablet, TAKE 1 TABLET BY MOUTH 2 TIMES A DAY AS NEEDED FOR MUSCLE SPASMS (Patient taking differently: Take 10 mg by mouth 2 (two) times daily as needed for muscle spasms.) .  LINZESS 145 MCG CAPS capsule, Take 145 mcg by mouth daily. .  ondansetron (ZOFRAN ODT) 4 MG disintegrating tablet, Take 1 tablet (4 mg total) by mouth every 4 (four) hours as needed for nausea or vomiting. .  pantoprazole (PROTONIX) 40 MG tablet, TAKE 1 TABLET BY MOUTH ONCE DAILY .  potassium chloride (MICRO-K) 10 MEQ CR capsule, Take 20 mEq by mouth daily. Marland Kitchen  rOPINIRole (REQUIP) 3 MG tablet, TAKE 1 TABLET BY MOUTH NIGHTLY AT BEDTIME AS NEEDED .  sertraline (ZOLOFT) 100 MG tablet, TAKE 1 & ONE-HALF TABLET BY MOUTH EVERY DAY .   Tetrahydrozoline HCl (VISINE OP), Place 1 drop into both eyes daily as needed (dry eyes).  Karma Greaser 4 MG/0.1ML LIQD nasal spray kit, Place 0.4 mg into the nose daily as needed (opioid overdose).  (Patient not taking: Reported on 12/31/2020)

## 2021-01-05 NOTE — Assessment & Plan Note (Signed)
Last vitamin D Lab Results  Component Value Date   VD25OH 24.56 (L) 12/31/2020   Stable, cont oral replacement

## 2021-01-05 NOTE — Assessment & Plan Note (Signed)
Lab Results  Component Value Date   LDLCALC 83 12/31/2020   Stable, pt to continue to decline statin

## 2021-01-05 NOTE — Assessment & Plan Note (Signed)

## 2021-01-05 NOTE — Assessment & Plan Note (Signed)
Mild, for volt gel topical prn, rest,  to f/u any worsening symptoms or concerns

## 2021-01-23 ENCOUNTER — Telehealth: Payer: Self-pay | Admitting: Internal Medicine

## 2021-01-23 DIAGNOSIS — R739 Hyperglycemia, unspecified: Secondary | ICD-10-CM | POA: Diagnosis not present

## 2021-01-23 DIAGNOSIS — N390 Urinary tract infection, site not specified: Secondary | ICD-10-CM | POA: Diagnosis not present

## 2021-01-23 NOTE — Telephone Encounter (Signed)
    Patient requesting letter to provide [facility] to excuse her from jury duty 03/12/21. She states her difficulty walking is the issue to get from car into location in the [place] # [phone]

## 2021-01-24 DIAGNOSIS — J321 Chronic frontal sinusitis: Secondary | ICD-10-CM | POA: Diagnosis not present

## 2021-01-24 DIAGNOSIS — Z79891 Long term (current) use of opiate analgesic: Secondary | ICD-10-CM | POA: Diagnosis not present

## 2021-01-24 DIAGNOSIS — M25561 Pain in right knee: Secondary | ICD-10-CM | POA: Diagnosis not present

## 2021-01-24 DIAGNOSIS — G990 Autonomic neuropathy in diseases classified elsewhere: Secondary | ICD-10-CM | POA: Diagnosis not present

## 2021-01-24 DIAGNOSIS — M542 Cervicalgia: Secondary | ICD-10-CM | POA: Diagnosis not present

## 2021-01-24 DIAGNOSIS — Z0182 Encounter for allergy testing: Secondary | ICD-10-CM | POA: Diagnosis not present

## 2021-01-24 DIAGNOSIS — M1711 Unilateral primary osteoarthritis, right knee: Secondary | ICD-10-CM | POA: Diagnosis not present

## 2021-01-24 DIAGNOSIS — M25512 Pain in left shoulder: Secondary | ICD-10-CM | POA: Diagnosis not present

## 2021-01-24 DIAGNOSIS — J3089 Other allergic rhinitis: Secondary | ICD-10-CM | POA: Diagnosis not present

## 2021-01-24 DIAGNOSIS — G894 Chronic pain syndrome: Secondary | ICD-10-CM | POA: Diagnosis not present

## 2021-01-24 DIAGNOSIS — M25552 Pain in left hip: Secondary | ICD-10-CM | POA: Diagnosis not present

## 2021-01-24 DIAGNOSIS — G8929 Other chronic pain: Secondary | ICD-10-CM | POA: Diagnosis not present

## 2021-01-24 NOTE — Telephone Encounter (Signed)
Done hardcopy to s summers 

## 2021-01-24 NOTE — Telephone Encounter (Signed)
Pt.notified

## 2021-02-04 ENCOUNTER — Other Ambulatory Visit: Payer: Self-pay | Admitting: Internal Medicine

## 2021-02-04 MED ORDER — ROPINIROLE HCL 3 MG PO TABS: ORAL_TABLET | ORAL | 5 refills | Status: DC

## 2021-02-21 DIAGNOSIS — M545 Low back pain, unspecified: Secondary | ICD-10-CM | POA: Diagnosis not present

## 2021-02-21 DIAGNOSIS — M1711 Unilateral primary osteoarthritis, right knee: Secondary | ICD-10-CM | POA: Diagnosis not present

## 2021-02-21 DIAGNOSIS — Z0182 Encounter for allergy testing: Secondary | ICD-10-CM | POA: Diagnosis not present

## 2021-02-21 DIAGNOSIS — G990 Autonomic neuropathy in diseases classified elsewhere: Secondary | ICD-10-CM | POA: Diagnosis not present

## 2021-02-21 DIAGNOSIS — G8929 Other chronic pain: Secondary | ICD-10-CM | POA: Diagnosis not present

## 2021-02-21 DIAGNOSIS — M542 Cervicalgia: Secondary | ICD-10-CM | POA: Diagnosis not present

## 2021-02-21 DIAGNOSIS — Z79891 Long term (current) use of opiate analgesic: Secondary | ICD-10-CM | POA: Diagnosis not present

## 2021-02-21 DIAGNOSIS — M25561 Pain in right knee: Secondary | ICD-10-CM | POA: Diagnosis not present

## 2021-02-21 DIAGNOSIS — G894 Chronic pain syndrome: Secondary | ICD-10-CM | POA: Diagnosis not present

## 2021-02-21 DIAGNOSIS — M25512 Pain in left shoulder: Secondary | ICD-10-CM | POA: Diagnosis not present

## 2021-02-21 DIAGNOSIS — M25552 Pain in left hip: Secondary | ICD-10-CM | POA: Diagnosis not present

## 2021-02-21 DIAGNOSIS — J321 Chronic frontal sinusitis: Secondary | ICD-10-CM | POA: Diagnosis not present

## 2021-02-21 DIAGNOSIS — J3089 Other allergic rhinitis: Secondary | ICD-10-CM | POA: Diagnosis not present

## 2021-02-25 ENCOUNTER — Telehealth: Payer: Self-pay

## 2021-02-25 NOTE — Telephone Encounter (Signed)
Please mail jury duty letter to patient, per her request

## 2021-02-25 NOTE — Telephone Encounter (Signed)
Jury duty letter Maria [name]

## 2021-02-27 ENCOUNTER — Other Ambulatory Visit: Payer: Self-pay | Admitting: Internal Medicine

## 2021-03-03 ENCOUNTER — Other Ambulatory Visit: Payer: Self-pay | Admitting: Internal Medicine

## 2021-03-03 NOTE — Telephone Encounter (Signed)
Please refill as per office routine med refill policy (all routine meds refilled for 3 mo or monthly per pt preference up to one year from last visit, then month to month grace period for 3 mo, then further med refills will have to be denied)  

## 2021-03-06 ENCOUNTER — Observation Stay (HOSPITAL_COMMUNITY): Payer: Medicare Other

## 2021-03-06 ENCOUNTER — Inpatient Hospital Stay (HOSPITAL_COMMUNITY)
Admission: EM | Admit: 2021-03-06 | Discharge: 2021-03-11 | DRG: 291 | Disposition: A | Discharge status: Home Health | Payer: Medicare Other | Attending: Internal Medicine | Admitting: Internal Medicine

## 2021-03-06 ENCOUNTER — Encounter: Payer: Self-pay | Admitting: Internal Medicine

## 2021-03-06 ENCOUNTER — Other Ambulatory Visit: Payer: Self-pay

## 2021-03-06 ENCOUNTER — Encounter (HOSPITAL_COMMUNITY): Payer: Self-pay

## 2021-03-06 ENCOUNTER — Ambulatory Visit (INDEPENDENT_AMBULATORY_CARE_PROVIDER_SITE_OTHER): Payer: Medicare Other | Admitting: Internal Medicine

## 2021-03-06 VITALS — BP 152/72 | HR 89 | Temp 98.1°F | Ht 65.0 in | Wt 373.0 lb

## 2021-03-06 DIAGNOSIS — K219 Gastro-esophageal reflux disease without esophagitis: Secondary | ICD-10-CM | POA: Diagnosis not present

## 2021-03-06 DIAGNOSIS — G8929 Other chronic pain: Secondary | ICD-10-CM | POA: Diagnosis present

## 2021-03-06 DIAGNOSIS — Z20822 Contact with and (suspected) exposure to covid-19: Secondary | ICD-10-CM | POA: Diagnosis present

## 2021-03-06 DIAGNOSIS — Z825 Family history of asthma and other chronic lower respiratory diseases: Secondary | ICD-10-CM

## 2021-03-06 DIAGNOSIS — R739 Hyperglycemia, unspecified: Secondary | ICD-10-CM | POA: Diagnosis present

## 2021-03-06 DIAGNOSIS — Z818 Family history of other mental and behavioral disorders: Secondary | ICD-10-CM

## 2021-03-06 DIAGNOSIS — E785 Hyperlipidemia, unspecified: Secondary | ICD-10-CM | POA: Diagnosis not present

## 2021-03-06 DIAGNOSIS — R609 Edema, unspecified: Secondary | ICD-10-CM

## 2021-03-06 DIAGNOSIS — E1151 Type 2 diabetes mellitus with diabetic peripheral angiopathy without gangrene: Secondary | ICD-10-CM | POA: Diagnosis not present

## 2021-03-06 DIAGNOSIS — M25569 Pain in unspecified knee: Secondary | ICD-10-CM | POA: Diagnosis not present

## 2021-03-06 DIAGNOSIS — Z885 Allergy status to narcotic agent status: Secondary | ICD-10-CM

## 2021-03-06 DIAGNOSIS — Z888 Allergy status to other drugs, medicaments and biological substances status: Secondary | ICD-10-CM

## 2021-03-06 DIAGNOSIS — Z91040 Latex allergy status: Secondary | ICD-10-CM

## 2021-03-06 DIAGNOSIS — I10 Essential (primary) hypertension: Secondary | ICD-10-CM | POA: Diagnosis present

## 2021-03-06 DIAGNOSIS — Z79899 Other long term (current) drug therapy: Secondary | ICD-10-CM

## 2021-03-06 DIAGNOSIS — I700 Atherosclerosis of aorta: Secondary | ICD-10-CM | POA: Diagnosis present

## 2021-03-06 DIAGNOSIS — E739 Lactose intolerance, unspecified: Secondary | ICD-10-CM | POA: Diagnosis not present

## 2021-03-06 DIAGNOSIS — G2581 Restless legs syndrome: Secondary | ICD-10-CM | POA: Diagnosis present

## 2021-03-06 DIAGNOSIS — Z6841 Body Mass Index (BMI) 40.0 and over, adult: Secondary | ICD-10-CM

## 2021-03-06 DIAGNOSIS — M797 Fibromyalgia: Secondary | ICD-10-CM | POA: Diagnosis present

## 2021-03-06 DIAGNOSIS — E119 Type 2 diabetes mellitus without complications: Secondary | ICD-10-CM | POA: Diagnosis present

## 2021-03-06 DIAGNOSIS — I5033 Acute on chronic diastolic (congestive) heart failure: Secondary | ICD-10-CM | POA: Diagnosis present

## 2021-03-06 DIAGNOSIS — J811 Chronic pulmonary edema: Secondary | ICD-10-CM

## 2021-03-06 DIAGNOSIS — M545 Low back pain, unspecified: Secondary | ICD-10-CM | POA: Diagnosis present

## 2021-03-06 DIAGNOSIS — Z87891 Personal history of nicotine dependence: Secondary | ICD-10-CM | POA: Diagnosis not present

## 2021-03-06 DIAGNOSIS — G894 Chronic pain syndrome: Secondary | ICD-10-CM | POA: Diagnosis not present

## 2021-03-06 DIAGNOSIS — Z833 Family history of diabetes mellitus: Secondary | ICD-10-CM

## 2021-03-06 DIAGNOSIS — M7989 Other specified soft tissue disorders: Secondary | ICD-10-CM | POA: Diagnosis not present

## 2021-03-06 DIAGNOSIS — N1831 Chronic kidney disease, stage 3a: Secondary | ICD-10-CM | POA: Diagnosis present

## 2021-03-06 DIAGNOSIS — M4800 Spinal stenosis, site unspecified: Secondary | ICD-10-CM | POA: Diagnosis present

## 2021-03-06 DIAGNOSIS — Z8614 Personal history of Methicillin resistant Staphylococcus aureus infection: Secondary | ICD-10-CM | POA: Diagnosis not present

## 2021-03-06 DIAGNOSIS — E1169 Type 2 diabetes mellitus with other specified complication: Secondary | ICD-10-CM | POA: Diagnosis present

## 2021-03-06 DIAGNOSIS — Z881 Allergy status to other antibiotic agents status: Secondary | ICD-10-CM

## 2021-03-06 DIAGNOSIS — I110 Hypertensive heart disease with heart failure: Principal | ICD-10-CM | POA: Diagnosis present

## 2021-03-06 DIAGNOSIS — Z8249 Family history of ischemic heart disease and other diseases of the circulatory system: Secondary | ICD-10-CM | POA: Diagnosis not present

## 2021-03-06 DIAGNOSIS — E1165 Type 2 diabetes mellitus with hyperglycemia: Secondary | ICD-10-CM | POA: Diagnosis present

## 2021-03-06 DIAGNOSIS — I5031 Acute diastolic (congestive) heart failure: Secondary | ICD-10-CM | POA: Diagnosis present

## 2021-03-06 DIAGNOSIS — Z800 Family history of malignant neoplasm of digestive organs: Secondary | ICD-10-CM | POA: Diagnosis not present

## 2021-03-06 DIAGNOSIS — I517 Cardiomegaly: Secondary | ICD-10-CM | POA: Diagnosis not present

## 2021-03-06 LAB — COMPREHENSIVE METABOLIC PANEL
ALT: 19 U/L (ref 0–44)
AST: 19 U/L (ref 15–41)
Albumin: 3.9 g/dL (ref 3.5–5.0)
Alkaline Phosphatase: 73 U/L (ref 38–126)
Anion gap: 9 (ref 5–15)
BUN: 18 mg/dL (ref 8–23)
CO2: 27 mmol/L (ref 22–32)
Calcium: 8.8 mg/dL — ABNORMAL LOW (ref 8.9–10.3)
Chloride: 104 mmol/L (ref 98–111)
Creatinine, Ser: 0.96 mg/dL (ref 0.44–1.00)
GFR, Estimated: >60 mL/min (ref >60)
Glucose, Bld: 162 mg/dL — ABNORMAL HIGH (ref 70–99)
Potassium: 4 mmol/L (ref 3.5–5.1)
Sodium: 140 mmol/L (ref 135–145)
Total Bilirubin: 0.7 mg/dL (ref 0.3–1.2)
Total Protein: 7.2 g/dL (ref 6.5–8.1)

## 2021-03-06 LAB — URINALYSIS, ROUTINE W REFLEX MICROSCOPIC
Bilirubin Urine: NEGATIVE
Glucose, UA: NEGATIVE mg/dL
Hgb urine dipstick: NEGATIVE
Ketones, ur: NEGATIVE mg/dL
Nitrite: NEGATIVE
Protein, ur: NEGATIVE mg/dL
Specific Gravity, Urine: 1.015 (ref 1.005–1.030)
pH: 5 (ref 5.0–8.0)

## 2021-03-06 LAB — CBC
HCT: 39.1 % (ref 36.0–46.0)
Hemoglobin: 12.3 g/dL (ref 12.0–15.0)
MCH: 29.6 pg (ref 26.0–34.0)
MCHC: 31.5 g/dL (ref 30.0–36.0)
MCV: 94 fL (ref 80.0–100.0)
Platelets: 286 10*3/uL (ref 150–400)
RBC: 4.16 MIL/uL (ref 3.87–5.11)
RDW: 12.8 % (ref 11.5–15.5)
WBC: 8.6 10*3/uL (ref 4.0–10.5)
nRBC: 0 % (ref 0.0–0.2)

## 2021-03-06 LAB — RESP PANEL BY RT-PCR (FLU A&B, COVID) ARPGX2
Influenza A by PCR: NEGATIVE
Influenza B by PCR: NEGATIVE
SARS Coronavirus 2 by RT PCR: NEGATIVE

## 2021-03-06 LAB — TROPONIN I (HIGH SENSITIVITY): Troponin I (High Sensitivity): 5 ng/L (ref <18)

## 2021-03-06 LAB — BRAIN NATRIURETIC PEPTIDE: B Natriuretic Peptide: 12.6 pg/mL (ref 0.0–100.0)

## 2021-03-06 LAB — LIPASE, BLOOD: Lipase: 36 U/L (ref 11–51)

## 2021-03-06 MED ORDER — OXYCODONE HCL 5 MG PO TABS
5.0000 mg | ORAL_TABLET | Freq: Once | ORAL | Status: AC
  Administered 2021-03-06: 5 mg via ORAL
  Filled 2021-03-06: qty 1

## 2021-03-06 MED ORDER — ACETAMINOPHEN 500 MG PO TABS
1000.0000 mg | ORAL_TABLET | Freq: Once | ORAL | Status: AC
  Administered 2021-03-06: 1000 mg via ORAL
  Filled 2021-03-06: qty 2

## 2021-03-06 MED ORDER — FUROSEMIDE 10 MG/ML IJ SOLN
40.0000 mg | Freq: Once | INTRAMUSCULAR | Status: AC
  Administered 2021-03-06: 40 mg via INTRAVENOUS
  Filled 2021-03-06: qty 4

## 2021-03-06 NOTE — ED Notes (Signed)
RN attempted 2x, Charge RN Mortimer Fries attempted 2x for IV. Unsuccessful. IV team consult in place.

## 2021-03-06 NOTE — Progress Notes (Signed)
Patient ID: Maria [name], female   DOB: 1956/08/20, 65 y.o.   MRN: [id number]        Chief Complaint: wt gain, worsening bilateral leg swelling acute on chronic, right lower back pain and cramping all over, marked fatigue and debility       HPI:  Maria [name] is a 65 y.o. female here with c/o above, gradually for several wks but now quite debilitating, can hardly get around or ambulate, had to have someone drive her here.  Pt denies chest pain, increased sob or doe, wheezing, orthopnea, PND, palpitations, dizziness or syncope. Denies new focal neuro s/s.   Pt denies polydipsia, polyuria,   Pt denies fever, wt loss, night sweats, loss of appetite, or other constitutional symptoms.  Needs jury duty letter.  Not taking vit d.         Wt Readings from Last 3 Encounters:  03/09/21 (!) 360 lb 0.2 oz (163.3 kg)  03/06/21 (!) 373 lb (169.2 kg)  12/31/20 (!) 372 lb (168.7 kg)   BP Readings from Last 3 Encounters:  03/09/21 (!) 142/63  03/06/21 (!) 152/72  12/31/20 (!) 142/70         Past Medical History:  Diagnosis Date  . Acute lymphadenitis 2011  . ALLERGIC RHINITIS 08/10/2007  . ANXIETY 12/06/2007  . Atherosclerotic peripheral vascular disease ([place]) 06/13/2013   Aorta on CT June 2014  . BACK PAIN 12/06/2007  . Cellulitis 2011  . Cervical disc disease 03/09/2012  . Chronic pain 03/09/2012  . DEPRESSION 12/06/2007  . DIVERTICULOSIS, [place] 12/06/2007  . GERD 12/06/2007  . Hepatitis    age 72 hepatitis A  . HLD (hyperlipidemia) 05/24/2019  . HYPERTENSION 12/06/2007  . Impaired glucose tolerance 03/13/2012  . LOW BACK PAIN 12/06/2007  . Lumbar disc disease 03/09/2012  . Mesenteric adenitis   . MRSA 2006  . Pain    by breast area  . Pain in joint, multiple sites 04/21/2010  . PARONYCHIA, FINGER 08/13/2009  . Pneumonia sept 2015   walking pneumonia, sepsis   . RASH-NONVESICULAR 05/09/2008  . 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 SHOULDER PAIN, LEFT 05/09/2008  . SINUSITIS- ACUTE-NOS  05/09/2008  . THORACIC/LUMBOSACRAL NEURITIS/RADICULITIS UNSPEC 12/28/2008   Past Surgical History:  Procedure Laterality Date  . ABDOMINAL HYSTERECTOMY  1999   1 ovary left  . bloo clot removed from neck   may 25th , june 2. 2010  . [place] SURGERY  may 24th 2010  . COLONOSCOPY WITH PROPOFOL N/A 07/10/2016   Procedure: COLONOSCOPY WITH PROPOFOL;  Surgeon: Manus Gunning, MD;  Location: [facility];  Service: Gastroenterology;  Laterality: N/A;  . COLONOSCOPY WITH PROPOFOL N/A 09/26/2019   Procedure: COLONOSCOPY WITH PROPOFOL;  Surgeon: Yetta Flock, MD;  Location: [facility];  Service: Gastroenterology;  Laterality: N/A;  . ESOPHAGOGASTRODUODENOSCOPY (EGD) WITH PROPOFOL N/A 07/10/2016   Procedure: ESOPHAGOGASTRODUODENOSCOPY (EGD) WITH PROPOFOL;  Surgeon: Manus Gunning, MD;  Location: [facility];  Service: Gastroenterology;  Laterality: N/A;  . POLYPECTOMY  09/26/2019   Procedure: POLYPECTOMY;  Surgeon: Yetta Flock, MD;  Location: [facility];  Service: Gastroenterology;;  . s/p ovary cyst    . s/p right knee arthroscopy     Dr. Mardelle Matte ortho    reports that she quit smoking about 12 years ago. She quit after 30.00 years of use. She has never used smokeless tobacco. She reports that she does not drink alcohol and does not use drugs. family history includes Anxiety disorder in her  sister; Asthma in her daughter and sister; Bipolar disorder in her daughter; Cancer in her maternal uncle and another family member; Depression in her sister; Diabetes in her mother; Heart disease in her mother; Heart failure in her mother; Hypertension in her father and mother; Liver cancer in her paternal grandmother. Allergies  Allergen Reactions  . Amoxicillin-Pot Clavulanate Nausea And Vomiting    Projectile vomiting Did it involve swelling of the face/tongue/throat, SOB, or low BP? No Did it involve sudden or severe rash/hives, skin peeling, or any reaction on the inside  of your mouth or nose? No Did you need to seek medical attention at a hospital or doctor's office? No When did it last happen?20-30 years If all above answers are "NO", may proceed with cephalosporin use.   . Adhesive [Tape] Other (See Comments)    Tears skin off - use paper tape   . Codeine Other (See Comments)    hallucinations  . Crestor [Rosuvastatin Calcium]     Severe muscle cramps  . Lactose Intolerance (Gi) Nausea And Vomiting  . Morphine And Related Nausea And Vomiting and Other (See Comments)    Migraine headaches  . Vicodin [Hydrocodone-Acetaminophen] Other (See Comments)    hallucinations  . Latex Rash   No current facility-administered medications on file prior to visit.   Current Outpatient Medications on File Prior to Visit  Medication Sig Dispense Refill  . acetaminophen (TYLENOL) 325 MG tablet Take 650 mg by mouth every 6 (six) hours as needed for headache (pain).    Marland Kitchen albuterol (PROAIR HFA) 108 (90 Base) MCG/ACT inhaler Inhale 2 puffs into the lungs every 6 (six) hours as needed for wheezing or shortness of breath. 1 each 0  . clonazePAM (KLONOPIN) 1 MG tablet TAKE 1 TABLET BY MOUTH 2 TIMES A DAY AS NEEDED FOR ANXIETY (Patient taking differently: Take 1 mg by mouth 2 (two) times daily as needed for anxiety.) 60 tablet 5  . cyclobenzaprine (FLEXERIL) 10 MG tablet TAKE 1 TABLET BY MOUTH 2 TIMES A DAY AS NEEDED FOR MUSCLE SPASMS (Patient taking differently: Take 10 mg by mouth 2 (two) times daily as needed for muscle spasms.) 180 tablet 1  . furosemide (LASIX) 40 MG tablet Take 1 tablet (40 mg total) by mouth daily. (Patient taking differently: Take 80 mg by mouth daily.) 90 tablet 3  . LINZESS 145 MCG CAPS capsule Take 145 mcg by mouth daily as needed (constipation).    Marland Kitchen losartan (COZAAR) 100 MG tablet TAKE 1 TABLET BY MOUTH DAILY (Patient taking differently: Take 100 mg by mouth daily.) 90 tablet 1  . metolazone (ZAROXOLYN) 2.5 MG tablet Take 2.5 mg by mouth daily  as needed (fluid).    Karma Greaser 4 MG/0.1ML LIQD nasal spray kit Place 0.4 mg into the nose daily as needed (opioid overdose).    . ondansetron (ZOFRAN ODT) 4 MG disintegrating tablet Take 1 tablet (4 mg total) by mouth every 4 (four) hours as needed for nausea or vomiting. 20 tablet 0  . Oxycodone HCl 10 MG TABS Take 1 tablet (10 mg total) 4 (four) times daily by mouth. Per [facility] (Patient taking differently: Take 10 mg by mouth every 4 (four) hours as needed (pain). Per [facility]) 30 tablet 0  . pantoprazole (PROTONIX) 40 MG tablet TAKE ONE TABLET BY MOUTH DAILY (Patient taking differently: Take 40 mg by mouth daily.) 90 tablet 2  . potassium chloride (MICRO-K) 10 MEQ CR capsule Take 20 mEq by mouth daily.    Marland Kitchen  rOPINIRole (REQUIP) 3 MG tablet TAKE 1 TABLET BY MOUTH NIGHTLY AT BEDTIME AS NEEDED (Patient taking differently: Take 3 mg by mouth at bedtime as needed (RLS).) 30 tablet 5  . rOPINIRole (REQUIP) 3 MG tablet TAKE 1 TABLET BY MOUTH NIGHTLY AT BEDTIME AS NEEDED (Patient not taking: Reported on 03/06/2021) 30 tablet 5  . Tetrahydrozoline HCl (VISINE OP) Place 1 drop into both eyes daily as needed (dry eyes).    . [DISCONTINUED] DULoxetine (CYMBALTA) 60 MG capsule Take 60 mg by mouth daily.      . [DISCONTINUED] fexofenadine (ALLEGRA) 180 MG tablet Take 180 mg by mouth daily.            ROS:  All others reviewed and negative.  Objective        PE:  BP (!) 152/72   Pulse 89   Temp 98.1 F (36.7 C) (Oral)   Ht '5\' 5"'  (1.651 m)   Wt (!) 373 lb (169.2 kg)   SpO2 95%   BMI 62.07 kg/m                 Constitutional: Pt appears in NAD               HENT: Head: NCAT.                Right Ear: External ear normal.                 Left Ear: External ear normal.                Eyes: . Pupils are equal, round, and reactive to light. Conjunctivae and EOM are normal               Nose: without d/c or deformity               Neck: Neck supple. Gross normal ROM                Cardiovascular: Normal rate and regular rhythm.                 Pulmonary/Chest: Effort normal and breath sounds without rales or wheezing.                Abd:  Soft, NT, ND, + BS, no organomegaly               Neurological: Pt is alert. At baseline orientation, motor grossly intact               Skin: Skin is warm. No rashes, no other new lesions, LE edema - 2-3+ bilateral               Psychiatric: Pt behavior is normal without agitation   Micro: none  Cardiac tracings I have personally interpreted today:  none  Pertinent Radiological findings (summarize): none   Lab Results  Component Value Date   WBC 7.4 03/08/2021   HGB 12.0 03/08/2021   HCT 37.7 03/08/2021   PLT 253 03/08/2021   GLUCOSE 127 (H) 03/08/2021   CHOL 156 12/31/2020   TRIG 133.0 12/31/2020   HDL 46.90 12/31/2020   LDLDIRECT 127.0 01/25/2019   LDLCALC 83 12/31/2020   ALT 20 03/07/2021   AST 18 03/07/2021   NA 141 03/08/2021   K 3.6 03/08/2021   CL 100 03/08/2021   CREATININE 0.97 03/08/2021   BUN 15 03/08/2021   CO2 31 03/08/2021   TSH 1.45 12/31/2020   INR 1.1 08/23/2019  HGBA1C 6.5 (H) 03/07/2021   Assessment/Plan:  Maria [name] is a 65 y.o. White or Caucasian [1] female with  has a past medical history of Acute lymphadenitis (2011), ALLERGIC RHINITIS (08/10/2007), ANXIETY (12/06/2007), Atherosclerotic peripheral vascular disease ([place]) (06/13/2013), BACK PAIN (12/06/2007), Cellulitis (2011), Cervical disc disease (03/09/2012), Chronic pain (03/09/2012), DEPRESSION (12/06/2007), DIVERTICULOSIS, [place] (12/06/2007), GERD (12/06/2007), Hepatitis, HLD (hyperlipidemia) (05/24/2019), HYPERTENSION (12/06/2007), Impaired glucose tolerance (03/13/2012), LOW BACK PAIN (12/06/2007), Lumbar disc disease (03/09/2012), Mesenteric adenitis, MRSA (2006), Pain, Pain in joint, multiple sites (04/21/2010), PARONYCHIA, FINGER (08/13/2009), Pneumonia (sept 2015), RASH-NONVESICULAR (05/09/2008), 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[place]) (11/08/2017),  SHOULDER PAIN, LEFT (05/09/2008), SINUSITIS- ACUTE-NOS (05/09/2008), and THORACIC/LUMBOSACRAL NEURITIS/RADICULITIS UNSPEC (12/28/2008).  Peripheral edema Has gained over 10 lbs recenly with very large LE swelling to hips now - ? chf on lymphedema, with bilateral LE pain, gait difficutly with high risk of fall, worsening right lower back pain, diffuse MSK cramping random intermittent, and today with significant weakness and sleepiness /fatigue unable to cope well with home ADLs after recent [facility]TI tx [facility] with 10 days bactrim - I suggested pt for urgent ED evaluation for possible hospital admission depending on results  Essential hypertension . BP Readings from Last 3 Encounters:  03/09/21 (!) 142/63  03/06/21 (!) 152/72  12/31/20 (!) 142/70   Stable, pt to continue medical treatment - losartan 100   Hyperglycemia Lab Results  Component Value Date   HGBA1C 6.5 (H) 03/07/2021   Stable, pt to continue current medical treatment  - for diet, wt control   Followup: No follow-ups on file.  Cathlean Cower, MD 03/09/2021 4:51 AM [facility]

## 2021-03-06 NOTE — ED Triage Notes (Signed)
Pt reports bilateral leg swelling, right flank pain, and nausea that began a few days ago. Pt reports she spoke to her PCP and they told her to come here.

## 2021-03-06 NOTE — Assessment & Plan Note (Signed)
Has gained over 10 lbs recenly with very large LE swelling to hips now - ? chf on lymphedema, with bilateral LE pain, gait difficutly with high risk of fall, worsening right lower back pain, diffuse MSK cramping random intermittent, and today with significant weakness and sleepiness /fatigue unable to cope well with home ADLs after recent UTI tx per [facility] with 10 days bactrim - I suggested pt for urgent ED evaluation for possible hospital admission depending on results

## 2021-03-06 NOTE — ED Provider Notes (Signed)
[facility] DEPT Provider Note   CSN: [id number] Arrival date & time: 03/06/21  1726     History Chief Complaint  Patient presents with  . Leg Swelling  . Flank Pain  . Nausea    Maria [name] is a 65 y.o. female.  65 yo F with a chief complaints of worsening bilateral lower extremity edema.  She has a problem with this chronically but feels like it is worsened over the past couple weeks.  At a point where her legs are very heavy and she has trouble even taking a few steps.  She is also developed worsening right-sided low back pain that radiates to the thigh.  She denies trauma denies numbness or weakness in the leg denies loss of bowel or bladder denies loss of peritoneal sensation.  She has a history of spinal stenosis and has chronic back pain but feels like this is somewhat different.  She saw her family doctor today who was concerned with the amount of edema that had progressed and suggested she come to the ED for evaluation.  She denies any orthopnea or PND.  The history is provided by the patient.  Flank Pain Pertinent negatives include no chest pain, no headaches and no shortness of breath.  Illness Severity:  Moderate Onset quality:  Gradual Duration:  2 weeks Timing:  Constant Progression:  Worsening Chronicity:  New Associated symptoms: no chest pain, no congestion, no fever, no headaches, no myalgias, no nausea, no rhinorrhea, no shortness of breath, no vomiting and no wheezing        Past Medical History:  Diagnosis Date  . Acute lymphadenitis 2011  . ALLERGIC RHINITIS 08/10/2007  . ANXIETY 12/06/2007  . Atherosclerotic peripheral vascular disease ([place]) 06/13/2013   Aorta on CT June 2014  . BACK PAIN 12/06/2007  . Cellulitis 2011  . Cervical disc disease 03/09/2012  . Chronic pain 03/09/2012  . DEPRESSION 12/06/2007  . DIVERTICULOSIS, [place] 12/06/2007  . GERD 12/06/2007  . Hepatitis    age 53 hepatitis A  . HLD  (hyperlipidemia) 05/24/2019  . HYPERTENSION 12/06/2007  . Impaired glucose tolerance 03/13/2012  . LOW BACK PAIN 12/06/2007  . Lumbar disc disease 03/09/2012  . Mesenteric adenitis   . MRSA 2006  . Pain    by breast area  . Pain in joint, multiple sites 04/21/2010  . PARONYCHIA, FINGER 08/13/2009  . Pneumonia sept 2015   walking pneumonia, sepsis   . RASH-NONVESICULAR 05/09/2008  . 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 SHOULDER PAIN, LEFT 05/09/2008  . SINUSITIS- ACUTE-NOS 05/09/2008  . THORACIC/LUMBOSACRAL NEURITIS/RADICULITIS UNSPEC 12/28/2008    Patient Active Problem List   Diagnosis Date Noted  . Acute on chronic diastolic (congestive) heart failure ([place]) 03/06/2021  . Vitamin D deficiency 01/01/2021  . Medial epicondylitis 12/31/2020  . Aortic atherosclerosis ([place]) 09/26/2020  . Umbilical hernia 81/85/6314  . Constipation 08/05/2020  . Hoarseness 05/01/2020  . Vitamin B 12 deficiency 01/31/2020  . Rosacea 01/31/2020  . Neck swelling 01/31/2020  . History of colonic polyps   . Benign neoplasm of [place]   . Nausea 08/31/2019  . Throat pain 07/26/2019  . HLD (hyperlipidemia) 05/24/2019  . Leg cramps 05/24/2019  . Lump in neck 05/24/2019  . Hyperglycemia 01/25/2019  . Left thyroid nodule 01/25/2019  . Jerking movements of extremities 07/20/2018  . Balance disorder 07/20/2018  . Allergic rhinitis 06/15/2018  . Right knee pain 03/21/2018  . OSA (obstructive sleep apnea) 03/21/2018  . Oxygen desaturation  03/21/2018  . Urinary symptom or sign 03/21/2018  . Acquired lymphedema 01/25/2018  . Gait disorder 01/25/2018  . Peripheral edema 01/16/2018  . SOB (shortness of breath) 01/15/2018  . 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 Encounter for well adult exam with abnormal findings 11/08/2017  . Abdominal pain, epigastric   . Dysphagia   . [place] cancer screening   . Adverse effect of angiotensin-converting enzyme inhibitor 12/05/2015  . Restless leg 10/08/2015  .  Dermatitis, stasis 04/26/2015  . Fibromyalgia 01/31/2015  . Acid reflux 10/17/2014  . 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 Post-traumatic osteoarthritis of both knees 09/19/2014  . Spinal stenosis 09/19/2014  . Cervical disc disease 03/09/2012  . Lumbar disc disease 03/09/2012  . Chronic pain 03/09/2012  . Obesity 06/17/2011  . Chronic low back pain 12/28/2008  . Anxiety state 12/06/2007  . Depression 12/06/2007  . Essential hypertension 12/06/2007  . LOW BACK PAIN 12/06/2007    Past Surgical History:  Procedure Laterality Date  . ABDOMINAL HYSTERECTOMY  1999   1 ovary left  . bloo clot removed from neck   may 25th , june 2. 2010  . [place] SURGERY  may 24th 2010  . COLONOSCOPY WITH PROPOFOL N/A 07/10/2016   Procedure: COLONOSCOPY WITH PROPOFOL;  Surgeon: Manus Gunning, MD;  Location: [facility];  Service: Gastroenterology;  Laterality: N/A;  . COLONOSCOPY WITH PROPOFOL N/A 09/26/2019   Procedure: COLONOSCOPY WITH PROPOFOL;  Surgeon: Yetta Flock, MD;  Location: [facility];  Service: Gastroenterology;  Laterality: N/A;  . ESOPHAGOGASTRODUODENOSCOPY (EGD) WITH PROPOFOL N/A 07/10/2016   Procedure: ESOPHAGOGASTRODUODENOSCOPY (EGD) WITH PROPOFOL;  Surgeon: Manus Gunning, MD;  Location: [facility];  Service: Gastroenterology;  Laterality: N/A;  . POLYPECTOMY  09/26/2019   Procedure: POLYPECTOMY;  Surgeon: Yetta Flock, MD;  Location: [facility];  Service: Gastroenterology;;  . s/p ovary cyst    . s/p right knee arthroscopy     Dr. Mardelle Matte ortho     OB History   No obstetric history on file.     Family History  Problem Relation Age of Onset  . Heart disease Mother   . Hypertension Mother   . Diabetes Mother   . Heart failure Mother   . Asthma Sister   . Anxiety disorder Sister   . Depression Sister   . Hypertension Father   . Asthma Daughter   . Bipolar disorder Daughter   . Cancer Maternal Uncle        [place]  .  Cancer Other        ovarian  . Liver cancer Paternal Grandmother        ????    Social History   Tobacco Use  . Smoking status: Former Smoker    Years: 30.00    Quit date: 11/08/2008    Years since quitting: 12.3  . Smokeless tobacco: Never Used  . Tobacco comment: quit 10/09  Vaping Use  . Vaping Use: Never used  Substance Use Topics  . Alcohol use: No    Comment: quit drinking 10/07/1997  . Drug use: No    Home Medications Prior to Admission medications   Medication Sig Start Date End Date Taking? Authorizing Provider  acetaminophen (TYLENOL) 325 MG tablet Take 650 mg by mouth every 6 (six) hours as needed for headache (pain).    [provider]  albuterol (PROAIR HFA) 108 (90 Base) MCG/ACT inhaler Inhale 2 puffs into the lungs every 6 (six) hours as needed for wheezing or  shortness of breath. 11/05/20   Lucretia Kern, DO  clonazePAM (KLONOPIN) 1 MG tablet TAKE 1 TABLET BY MOUTH 2 TIMES A DAY AS NEEDED FOR ANXIETY 12/10/20   Biagio Borg, MD  cyclobenzaprine (FLEXERIL) 10 MG tablet TAKE 1 TABLET BY MOUTH 2 TIMES A DAY AS NEEDED FOR MUSCLE SPASMS 02/27/21   Biagio Borg, MD  furosemide (LASIX) 40 MG tablet Take 1 tablet (40 mg total) by mouth daily. Patient taking differently: Take 80 mg by mouth daily. 01/31/20   Biagio Borg, MD  LINZESS 145 MCG CAPS capsule Take 145 mcg by mouth daily. 08/15/20   [provider]  losartan (COZAAR) 100 MG tablet TAKE 1 TABLET BY MOUTH DAILY 08/06/20   Biagio Borg, MD  metolazone (ZAROXOLYN) 2.5 MG tablet Take by mouth. 06/11/20   [provider]  NARCAN 4 MG/0.1ML LIQD nasal spray kit Place 0.4 mg into the nose daily as needed (opioid overdose). 04/25/19   [provider]  ondansetron (ZOFRAN ODT) 4 MG disintegrating tablet Take 1 tablet (4 mg total) by mouth every 4 (four) hours as needed for nausea or vomiting. 08/23/19   Charlesetta Shanks, MD  Oxycodone HCl 10 MG TABS Take 1 tablet (10 mg total) 4 (four) times  daily by mouth. Per [facility] Patient taking differently: Take 10 mg by mouth every 4 (four) hours as needed (pain). Per [facility] 11/08/17   Biagio Borg, MD  pantoprazole (PROTONIX) 40 MG tablet TAKE ONE TABLET BY MOUTH DAILY 03/03/21   Biagio Borg, MD  potassium chloride (MICRO-K) 10 MEQ CR capsule Take 20 mEq by mouth daily. 07/26/20   [provider]  rOPINIRole (REQUIP) 3 MG tablet TAKE 1 TABLET BY MOUTH NIGHTLY AT BEDTIME AS NEEDED 02/04/21   Biagio Borg, MD  rOPINIRole (REQUIP) 3 MG tablet TAKE 1 TABLET BY MOUTH NIGHTLY AT BEDTIME AS NEEDED 02/04/21   Biagio Borg, MD  sertraline (ZOLOFT) 100 MG tablet TAKE 1 & ONE-HALF TABLET BY MOUTH EVERY DAY Patient not taking: Reported on 03/06/2021 03/18/20   Biagio Borg, MD  Tetrahydrozoline HCl (VISINE OP) Place 1 drop into both eyes daily as needed (dry eyes).  Patient not taking: Reported on 03/06/2021    [provider]  DULoxetine (CYMBALTA) 60 MG capsule Take 60 mg by mouth daily.    03/09/12  [provider]  fexofenadine (ALLEGRA) 180 MG tablet Take 180 mg by mouth daily.    03/09/12  [provider]    Allergies    Amoxicillin-pot clavulanate, Adhesive [tape], Codeine, Crestor [rosuvastatin calcium], Lactose intolerance (gi), Morphine and related, Vicodin [hydrocodone-acetaminophen], and Latex  Review of Systems   Review of Systems  Constitutional: Negative for chills and fever.  HENT: Negative for congestion and rhinorrhea.   Eyes: Negative for redness and visual disturbance.  Respiratory: Negative for shortness of breath and wheezing.   Cardiovascular: Negative for chest pain and palpitations.  Gastrointestinal: Negative for nausea and vomiting.  Genitourinary: Positive for flank pain. Negative for dysuria and urgency.  Musculoskeletal: Negative for arthralgias and myalgias.  Skin: Negative for pallor and wound.  Neurological: Negative for dizziness and headaches.     Physical Exam Updated Vital Signs BP 118/77   Pulse 88   Temp 98.1 F (36.7 C) (Oral)   Resp (!) 22   SpO2 100%   Physical Exam Vitals and nursing note reviewed.  Constitutional:      General: She is not  in acute distress.    Appearance: She is well-developed. She is not diaphoretic.  HENT:     Head: Normocephalic and atraumatic.  Eyes:     Pupils: Pupils are equal, round, and reactive to light.  Cardiovascular:     Rate and Rhythm: Normal rate and regular rhythm.     Heart sounds: No murmur heard. No friction rub. No gallop.   Pulmonary:     Effort: Pulmonary effort is normal.     Breath sounds: No wheezing or rales.  Abdominal:     General: There is no distension.     Palpations: Abdomen is soft.     Tenderness: There is no abdominal tenderness.  Musculoskeletal:        General: No tenderness.     Cervical back: Normal range of motion and neck supple.     Right lower leg: Edema present.     Left lower leg: Edema present.     Comments: 4+ edema to bilateral lower extremities up to the thighs.  Skin:    General: Skin is warm and dry.  Neurological:     Mental Status: She is alert and oriented to person, place, and time.  Psychiatric:        Behavior: Behavior normal.     ED Results / Procedures / Treatments   Labs (all labs ordered are listed, but only abnormal results are displayed) Labs Reviewed  COMPREHENSIVE METABOLIC PANEL - Abnormal; Notable for the following components:      Result Value   Glucose, Bld 162 (*)    Calcium 8.8 (*)    All other components within normal limits  URINALYSIS, ROUTINE W REFLEX MICROSCOPIC - Abnormal; Notable for the following components:   Leukocytes,Ua MODERATE (*)    Bacteria, UA RARE (*)    All other components within normal limits  RESP PANEL BY RT-PCR (FLU A&B, COVID) ARPGX2  LIPASE, BLOOD  CBC  BRAIN NATRIURETIC PEPTIDE  TROPONIN I (HIGH SENSITIVITY)    EKG None  Radiology No results  found.  Procedures Procedures   Medications Ordered in ED Medications  furosemide (LASIX) injection 40 mg (has no administration in time range)  acetaminophen (TYLENOL) tablet 1,000 mg (1,000 mg Oral Given 03/06/21 2103)  oxyCODONE (Oxy IR/ROXICODONE) immediate release tablet 5 mg (5 mg Oral Given 03/06/21 2103)    ED Course  I have reviewed the triage vital signs and the nursing notes.  Pertinent labs & imaging results that were available during my care of the patient were reviewed by me and considered in my medical decision making (see chart for details).    MDM Rules/Calculators/A&P                          65 yo F with a cc of leg swelling and right-sided low back pain.  Seen by her family doctor earlier today.  1kg weight gain in a few days.  She has been taking her Lasix and sounds like she has significant urine output.  No hypokalemia no kidney injury.  She does have significantly swollen lower extremities.  Will give a bolus dose of Lasix here.  May benefit from overnight diuresis.  Will discuss with the hospitalist.  I suspect the low back pain is sciatica.  Atraumatic.  No red flags otherwise.  The patients results and plan were reviewed and discussed.   Any x-rays performed were independently reviewed by myself.   Differential diagnosis were considered with the  presenting HPI.  Medications  furosemide (LASIX) injection 40 mg (has no administration in time range)  acetaminophen (TYLENOL) tablet 1,000 mg (1,000 mg Oral Given 03/06/21 2103)  oxyCODONE (Oxy IR/ROXICODONE) immediate release tablet 5 mg (5 mg Oral Given 03/06/21 2103)    Vitals:   03/06/21 1746 03/06/21 2001 03/06/21 2030  BP: (!) 144/77 (!) 144/49 118/77  Pulse: 98 88 88  Resp: 18 20 (!) 22  Temp: 98.1 F (36.7 C)    TempSrc: Oral    SpO2: 96% 100% 100%    Final diagnoses:  Acute on chronic diastolic (congestive) heart failure (HCC)    Admission/ observation were discussed with the admitting  physician, patient and/or family and they are comfortable with the plan.    Final Clinical Impression(s) / ED Diagnoses Final diagnoses:  Acute on chronic diastolic (congestive) heart failure [facility])    Rx / DC Orders ED Discharge Orders    None       Deno Etienne, DO 03/06/21 2127

## 2021-03-06 NOTE — Patient Instructions (Signed)
You have several significant problems today that we are unable to urgently evaluate here in the office  Due to your pain, weakness, muscle cramping, risk of falling and overall worsening swelling, I would recommend you go to the ED for more urgent evaluation  You are given the [place] [employer] note today

## 2021-03-07 ENCOUNTER — Encounter (HOSPITAL_COMMUNITY): Payer: Self-pay | Admitting: Internal Medicine

## 2021-03-07 ENCOUNTER — Inpatient Hospital Stay (HOSPITAL_COMMUNITY): Payer: Medicare Other

## 2021-03-07 DIAGNOSIS — R739 Hyperglycemia, unspecified: Secondary | ICD-10-CM

## 2021-03-07 DIAGNOSIS — K219 Gastro-esophageal reflux disease without esophagitis: Secondary | ICD-10-CM | POA: Diagnosis present

## 2021-03-07 DIAGNOSIS — E1151 Type 2 diabetes mellitus with diabetic peripheral angiopathy without gangrene: Secondary | ICD-10-CM | POA: Diagnosis present

## 2021-03-07 DIAGNOSIS — Z833 Family history of diabetes mellitus: Secondary | ICD-10-CM | POA: Diagnosis not present

## 2021-03-07 DIAGNOSIS — I5033 Acute on chronic diastolic (congestive) heart failure: Secondary | ICD-10-CM | POA: Diagnosis not present

## 2021-03-07 DIAGNOSIS — Z800 Family history of malignant neoplasm of digestive organs: Secondary | ICD-10-CM | POA: Diagnosis not present

## 2021-03-07 DIAGNOSIS — Z87891 Personal history of nicotine dependence: Secondary | ICD-10-CM | POA: Diagnosis not present

## 2021-03-07 DIAGNOSIS — G8929 Other chronic pain: Secondary | ICD-10-CM | POA: Diagnosis not present

## 2021-03-07 DIAGNOSIS — M545 Low back pain, unspecified: Secondary | ICD-10-CM

## 2021-03-07 DIAGNOSIS — I5031 Acute diastolic (congestive) heart failure: Secondary | ICD-10-CM | POA: Diagnosis present

## 2021-03-07 DIAGNOSIS — M25569 Pain in unspecified knee: Secondary | ICD-10-CM | POA: Diagnosis present

## 2021-03-07 DIAGNOSIS — G2581 Restless legs syndrome: Secondary | ICD-10-CM | POA: Diagnosis present

## 2021-03-07 DIAGNOSIS — Z8614 Personal history of Methicillin resistant Staphylococcus aureus infection: Secondary | ICD-10-CM | POA: Diagnosis not present

## 2021-03-07 DIAGNOSIS — Z818 Family history of other mental and behavioral disorders: Secondary | ICD-10-CM | POA: Diagnosis not present

## 2021-03-07 DIAGNOSIS — I10 Essential (primary) hypertension: Secondary | ICD-10-CM

## 2021-03-07 DIAGNOSIS — Z20822 Contact with and (suspected) exposure to covid-19: Secondary | ICD-10-CM | POA: Diagnosis present

## 2021-03-07 DIAGNOSIS — Z825 Family history of asthma and other chronic lower respiratory diseases: Secondary | ICD-10-CM | POA: Diagnosis not present

## 2021-03-07 DIAGNOSIS — Z6841 Body Mass Index (BMI) 40.0 and over, adult: Secondary | ICD-10-CM | POA: Diagnosis not present

## 2021-03-07 DIAGNOSIS — E785 Hyperlipidemia, unspecified: Secondary | ICD-10-CM | POA: Diagnosis present

## 2021-03-07 DIAGNOSIS — Z8249 Family history of ischemic heart disease and other diseases of the circulatory system: Secondary | ICD-10-CM | POA: Diagnosis not present

## 2021-03-07 DIAGNOSIS — M797 Fibromyalgia: Secondary | ICD-10-CM | POA: Diagnosis present

## 2021-03-07 DIAGNOSIS — E1165 Type 2 diabetes mellitus with hyperglycemia: Secondary | ICD-10-CM | POA: Diagnosis present

## 2021-03-07 DIAGNOSIS — M4800 Spinal stenosis, site unspecified: Secondary | ICD-10-CM | POA: Diagnosis present

## 2021-03-07 DIAGNOSIS — J811 Chronic pulmonary edema: Secondary | ICD-10-CM | POA: Diagnosis present

## 2021-03-07 DIAGNOSIS — G894 Chronic pain syndrome: Secondary | ICD-10-CM | POA: Diagnosis present

## 2021-03-07 DIAGNOSIS — E739 Lactose intolerance, unspecified: Secondary | ICD-10-CM | POA: Diagnosis present

## 2021-03-07 DIAGNOSIS — I110 Hypertensive heart disease with heart failure: Secondary | ICD-10-CM | POA: Diagnosis present

## 2021-03-07 DIAGNOSIS — I700 Atherosclerosis of aorta: Secondary | ICD-10-CM | POA: Diagnosis present

## 2021-03-07 LAB — CBC WITH DIFFERENTIAL/PLATELET
Abs Immature Granulocytes: 0.02 10*3/uL (ref 0.00–0.07)
Basophils Absolute: 0.1 10*3/uL (ref 0.0–0.1)
Basophils Relative: 1 %
Eosinophils Absolute: 0.2 10*3/uL (ref 0.0–0.5)
Eosinophils Relative: 2 %
HCT: 36.7 % (ref 36.0–46.0)
Hemoglobin: 11.7 g/dL — ABNORMAL LOW (ref 12.0–15.0)
Immature Granulocytes: 0 %
Lymphocytes Relative: 31 %
Lymphs Abs: 2.7 10*3/uL (ref 0.7–4.0)
MCH: 29.8 pg (ref 26.0–34.0)
MCHC: 31.9 g/dL (ref 30.0–36.0)
MCV: 93.6 fL (ref 80.0–100.0)
Monocytes Absolute: 0.7 10*3/uL (ref 0.1–1.0)
Monocytes Relative: 8 %
Neutro Abs: 5.1 10*3/uL (ref 1.7–7.7)
Neutrophils Relative %: 58 %
Platelets: 250 10*3/uL (ref 150–400)
RBC: 3.92 MIL/uL (ref 3.87–5.11)
RDW: 12.8 % (ref 11.5–15.5)
WBC: 8.7 10*3/uL (ref 4.0–10.5)
nRBC: 0 % (ref 0.0–0.2)

## 2021-03-07 LAB — COMPREHENSIVE METABOLIC PANEL
ALT: 20 U/L (ref 0–44)
AST: 18 U/L (ref 15–41)
Albumin: 3.8 g/dL (ref 3.5–5.0)
Alkaline Phosphatase: 58 U/L (ref 38–126)
Anion gap: 9 (ref 5–15)
BUN: 14 mg/dL (ref 8–23)
CO2: 28 mmol/L (ref 22–32)
Calcium: 8.7 mg/dL — ABNORMAL LOW (ref 8.9–10.3)
Chloride: 102 mmol/L (ref 98–111)
Creatinine, Ser: 0.9 mg/dL (ref 0.44–1.00)
GFR, Estimated: >60 mL/min (ref >60)
Glucose, Bld: 133 mg/dL — ABNORMAL HIGH (ref 70–99)
Potassium: 3.6 mmol/L (ref 3.5–5.1)
Sodium: 139 mmol/L (ref 135–145)
Total Bilirubin: 1 mg/dL (ref 0.3–1.2)
Total Protein: 6.8 g/dL (ref 6.5–8.1)

## 2021-03-07 LAB — HEMOGLOBIN A1C
Hgb A1c MFr Bld: 6.5 % — ABNORMAL HIGH (ref 4.8–5.6)
Mean Plasma Glucose: 139.85 mg/dL

## 2021-03-07 LAB — HIV ANTIBODY (ROUTINE TESTING W REFLEX): HIV Screen 4th Generation wRfx: REACTIVE — AB

## 2021-03-07 LAB — MAGNESIUM: Magnesium: 2 mg/dL (ref 1.7–2.4)

## 2021-03-07 MED ORDER — CYCLOBENZAPRINE HCL 10 MG PO TABS
10.0000 mg | ORAL_TABLET | Freq: Two times a day (BID) | ORAL | Status: DC | PRN
  Administered 2021-03-07 – 2021-03-11 (×5): 10 mg via ORAL
  Filled 2021-03-07 (×5): qty 1

## 2021-03-07 MED ORDER — ACETAMINOPHEN 325 MG PO TABS: 650.0000 mg | ORAL_TABLET | ORAL | Status: DC | PRN

## 2021-03-07 MED ORDER — SODIUM CHLORIDE 0.9 % IV SOLN: 250.0000 mL | INTRAVENOUS | Status: DC | PRN

## 2021-03-07 MED ORDER — FUROSEMIDE 10 MG/ML IJ SOLN
40.0000 mg | Freq: Two times a day (BID) | INTRAMUSCULAR | Status: DC
  Administered 2021-03-07 – 2021-03-10 (×8): 40 mg via INTRAVENOUS
  Filled 2021-03-07 (×8): qty 4

## 2021-03-07 MED ORDER — LOSARTAN POTASSIUM 50 MG PO TABS
100.0000 mg | ORAL_TABLET | Freq: Every day | ORAL | Status: DC
  Administered 2021-03-07 – 2021-03-11 (×5): 100 mg via ORAL
  Filled 2021-03-07 (×2): qty 2
  Filled 2021-03-07: qty 4
  Filled 2021-03-07 (×2): qty 2

## 2021-03-07 MED ORDER — ONDANSETRON HCL 4 MG/2ML IJ SOLN: 4.0000 mg | Freq: Four times a day (QID) | INTRAMUSCULAR | Status: DC | PRN

## 2021-03-07 MED ORDER — METOLAZONE 2.5 MG PO TABS
2.5000 mg | ORAL_TABLET | Freq: Every day | ORAL | Status: DC | PRN
  Filled 2021-03-07: qty 1

## 2021-03-07 MED ORDER — POTASSIUM CHLORIDE CRYS ER 20 MEQ PO TBCR
40.0000 meq | EXTENDED_RELEASE_TABLET | Freq: Once | ORAL | Status: AC
  Administered 2021-03-07: 40 meq via ORAL
  Filled 2021-03-07: qty 2

## 2021-03-07 MED ORDER — OXYCODONE HCL 5 MG PO TABS
10.0000 mg | ORAL_TABLET | Freq: Four times a day (QID) | ORAL | Status: DC | PRN
  Administered 2021-03-07 – 2021-03-11 (×11): 10 mg via ORAL
  Filled 2021-03-07 (×11): qty 2

## 2021-03-07 MED ORDER — SODIUM CHLORIDE 0.9% FLUSH
3.0000 mL | Freq: Two times a day (BID) | INTRAVENOUS | Status: DC
  Administered 2021-03-07 – 2021-03-11 (×9): 3 mL via INTRAVENOUS

## 2021-03-07 MED ORDER — ROPINIROLE HCL 1 MG PO TABS
3.0000 mg | ORAL_TABLET | Freq: Every evening | ORAL | Status: DC | PRN
Start: 2021-03-07 — End: 2021-03-09
  Administered 2021-03-07: 3 mg via ORAL
  Filled 2021-03-07 (×4): qty 3

## 2021-03-07 MED ORDER — ENOXAPARIN SODIUM 80 MG/0.8ML ~~LOC~~ SOLN
80.0000 mg | SUBCUTANEOUS | Status: DC
  Administered 2021-03-07 – 2021-03-10 (×4): 80 mg via SUBCUTANEOUS
  Filled 2021-03-07 (×4): qty 0.8

## 2021-03-07 MED ORDER — MORPHINE SULFATE (PF) 4 MG/ML IV SOLN
4.0000 mg | Freq: Once | INTRAVENOUS | Status: DC
  Filled 2021-03-07: qty 1

## 2021-03-07 MED ORDER — ALBUTEROL SULFATE HFA 108 (90 BASE) MCG/ACT IN AERS
2.0000 | INHALATION_SPRAY | Freq: Four times a day (QID) | RESPIRATORY_TRACT | Status: DC | PRN
  Filled 2021-03-07: qty 6.7

## 2021-03-07 MED ORDER — HYDROMORPHONE HCL 1 MG/ML IJ SOLN
0.5000 mg | Freq: Once | INTRAMUSCULAR | Status: AC
  Administered 2021-03-07: 0.5 mg via INTRAVENOUS
  Filled 2021-03-07: qty 1

## 2021-03-07 MED ORDER — TETRAHYDROZOLINE HCL 0.05 % OP SOLN
2.0000 [drp] | Freq: Every day | OPHTHALMIC | Status: DC | PRN
  Filled 2021-03-07: qty 15

## 2021-03-07 MED ORDER — LINACLOTIDE 145 MCG PO CAPS
145.0000 ug | ORAL_CAPSULE | Freq: Every day | ORAL | Status: DC | PRN
  Filled 2021-03-07: qty 1

## 2021-03-07 MED ORDER — SODIUM CHLORIDE 0.9% FLUSH: 3.0000 mL | INTRAVENOUS | Status: DC | PRN

## 2021-03-07 MED ORDER — PANTOPRAZOLE SODIUM 40 MG PO TBEC
40.0000 mg | DELAYED_RELEASE_TABLET | Freq: Every day | ORAL | Status: DC
  Administered 2021-03-07 – 2021-03-11 (×5): 40 mg via ORAL
  Filled 2021-03-07 (×5): qty 1

## 2021-03-07 MED ORDER — ENOXAPARIN SODIUM 40 MG/0.4ML ~~LOC~~ SOLN
40.0000 mg | SUBCUTANEOUS | Status: DC
  Filled 2021-03-07: qty 0.4

## 2021-03-07 NOTE — ED Notes (Signed)
Pt updated on plan of care. Pt repositioned self. Purewick in place. Call bell within reach. NAD noted. Pt A&O

## 2021-03-07 NOTE — Progress Notes (Signed)
  Echocardiogram 2D Echocardiogram has been attempted. Patient OTF. Will reattempt at later date.  Randa Lynn Valena Ivanov 03/07/2021, 3:24 PM

## 2021-03-07 NOTE — ED Notes (Signed)
Pt calling out requesting pain medication. Pt informed that it is not time for more pain medication at this time

## 2021-03-07 NOTE — ED Notes (Signed)
Placed Recliner in patients room for comfort

## 2021-03-07 NOTE — Progress Notes (Signed)
[facility] PROGRESS NOTE   Maria [name] KZS:[id number] DOB: April 04, 1956 DOA: 03/06/2021  PCP: Biagio Borg, MD  Brief History/Interval Summary: 65 year old female with past medical history of diastolic congestive heart failure (echo 01/2018 EF 65-70%), restless leg syndrome, chronic pain syndrome secondary to chronic back pain, gastroesophageal reflux disease and hypertension who presented to [facility] hospital emergency department at the direction of her primary care provider due to progressively worsening weight gain and peripheral edema.  Patient had been taking furosemide at home despite which she experiences weight gain and worsening lower extremity edema.  She has been hospitalized for intravenous diuretics.  Consultants: None  Procedures: Transthoracic echocardiogram has been ordered  Antibiotics: Anti-infectives (From admission, onward)   None      Subjective/Interval History: Patient mentions her shortness of breath is slightly better compared to yesterday.  Continues to have significant swelling in her legs.  Also feels that her abdomen is bloated.  Denies any nausea or vomiting.  Denies any abdominal pain.     Assessment/Plan:  Acute on chronic diastolic CHF Patient has been on 40 mg of furosemide at home despite which she has experienced significant weight gain and worsening lower extremity edema over the last few weeks.  Echocardiogram has been ordered.  Last echocardiogram is from 2019 which showed normal systolic function but did show grade 1 diastolic dysfunction.  Patient denies any history of sleep apnea and has been tested for this previously.  Continue strict ins and outs and daily weights.  Continue intravenous Lasix for now.  Monitor labs closely.  Essential hypertension Monitor blood pressures closely.  She is noted to be on Cozaar which is being continued.  Chronic low back pain/chronic knee pain Patient noted to be on oxycodone 10 mg up to 4  times daily as needed for her pain issues at home.  This is being continued.  PT and OT evaluation.  Patient lives in an apartment.  Has been using a cane to ambulate over the last few days.  Hyperglycemia Glucose noted to be 133 this morning.  HbA1c 6.5.  No known history of diabetes previously.  Patient might benefit from being on Metformin at discharge.  Outpatient follow-up with PCP.  History of GERD without esophagitis Continue PPI  Restless leg syndrome Continue ropinirole.  Class III obesity Estimated body mass index is 62.07 kg/m as calculated from the following:   Height as of an earlier encounter on 03/06/21: 5\' 5"  (1.651 m).   Weight as of an earlier encounter on 03/06/21: 169.2 kg.   DVT Prophylaxis: On Lovenox. we will request pharmacy to adjust for weight. Code Status: Full code Family Communication: Discussed with the patient Disposition Plan: Hopefully return home in a few days  Status is: Observation  The patient will require care spanning > 2 midnights and should be moved to inpatient because: IV treatments appropriate due to intensity of illness or inability to take PO and Inpatient level of care appropriate due to severity of illness  Dispo: The patient is from: Home              Anticipated d/c is to: Home              Patient currently is not medically stable to d/c.   Difficult to place patient No     Medications:  Scheduled: . enoxaparin (LOVENOX) injection  40 mg Subcutaneous Q24H  . furosemide  40 mg Intravenous BID  . losartan  100 mg Oral Daily  .  pantoprazole  40 mg Oral Daily  . sodium chloride flush  3 mL Intravenous Q12H   Continuous: . sodium chloride     OFB:PZWCHE chloride, acetaminophen, albuterol, cyclobenzaprine, linaclotide, ondansetron (ZOFRAN) IV, oxyCODONE, rOPINIRole, sodium chloride flush, tetrahydrozoline   Objective:  Vital Signs  Vitals:   03/07/21 0500 03/07/21 0600 03/07/21 0700 03/07/21 0907  BP: (!) 157/63 (!)  159/61 (!) 154/69 (!) 168/64  Pulse: 80 79 81 88  Resp: 16 17 16 14   Temp:      TempSrc:      SpO2: 96% 96% 96% 95%    Intake/Output Summary (Last 24 hours) at 03/07/2021 0945 Last data filed at 03/07/2021 0452 Gross per 24 hour  Intake 60 ml  Output 2300 ml  Net -2240 ml   There were no vitals filed for this visit.  General appearance: Awake alert.  In no distress Resp: Normal effort at rest.  Diminished air entry at the bases with few crackles.  No wheezing or rhonchi. Cardio: S1-S2 is normal regular.  No S3-S4.  No rubs murmurs or bruit GI: Abdomen is soft.  Nontender nondistended.  Bowel sounds are present normal.  No masses organomegaly Extremities: 1-2+ pitting edema.  Mild erythema noted in the lower aspects of the legs bilaterally. Neurologic: Alert and oriented x3.  No focal neurological deficits.    Lab Results:  Data Reviewed: I have personally reviewed following labs and imaging studies  CBC: Recent Labs  Lab 03/06/21 1808 03/07/21 0515  WBC 8.6 8.7  NEUTROABS  --  5.1  HGB 12.3 11.7*  HCT 39.1 36.7  MCV 94.0 93.6  PLT 286 527    Basic Metabolic Panel: Recent Labs  Lab 03/06/21 1808 03/07/21 0515  NA 140 139  K 4.0 3.6  CL 104 102  CO2 27 28  GLUCOSE 162* 133*  BUN 18 14  CREATININE 0.96 0.90  CALCIUM 8.8* 8.7*  MG  --  2.0    GFR: Estimated Creatinine Clearance: 102.9 mL/min (by C-G formula based on SCr of 0.9 mg/dL).  Liver Function Tests: Recent Labs  Lab 03/06/21 1808 03/07/21 0515  AST 19 18  ALT 19 20  ALKPHOS 73 58  BILITOT 0.7 1.0  PROT 7.2 6.8  ALBUMIN 3.9 3.8    Recent Labs  Lab 03/06/21 1808  LIPASE 36    HbA1C: Recent Labs    03/07/21 0515  HGBA1C 6.5*     Recent Results (from the past 240 hour(s))  Resp Panel by RT-PCR (Flu A&B, Covid) Nasopharyngeal Swab     Status: None   Collection Time: 03/06/21  8:37 PM   Specimen: Nasopharyngeal Swab; Nasopharyngeal(NP) swabs in vial transport medium  Result Value  Ref Range Status   SARS Coronavirus 2 by RT PCR NEGATIVE NEGATIVE Final    Comment: (NOTE) SARS-CoV-2 target nucleic acids are NOT DETECTED.  The SARS-CoV-2 RNA is generally detectable in upper respiratory specimens during the acute phase of infection. The lowest concentration of SARS-CoV-2 viral copies this assay can detect is 138 copies/mL. A negative result does not preclude SARS-Cov-2 infection and should not be used as the sole basis for treatment or other patient management decisions. A negative result may occur with  improper specimen collection/handling, submission of specimen other than nasopharyngeal swab, presence of viral mutation(s) within the areas targeted by this assay, and inadequate number of viral copies(<138 copies/mL). A negative result must be combined with clinical observations, patient history, and epidemiological information. The expected result is Negative.  Fact Sheet for Patients:  EntrepreneurPulse.com.au  Fact Sheet for Healthcare Providers:  IncredibleEmployment.be  This test is no t yet approved or cleared by the [place] FDA and  has been authorized for detection and/or diagnosis of SARS-CoV-2 by FDA under an Emergency Use Authorization (EUA). This EUA will remain  in effect (meaning this test can be used) for the duration of the COVID-19 declaration under Section 564(b)(1) of the Act, 21 U.S.C.section 360bbb-3(b)(1), unless the authorization is terminated  or revoked sooner.       Influenza A by PCR NEGATIVE NEGATIVE Final   Influenza B by PCR NEGATIVE NEGATIVE Final    Comment: (NOTE) The Xpert Xpress SARS-CoV-2/FLU/RSV plus assay is intended as an aid in the diagnosis of influenza from Nasopharyngeal swab specimens and should not be used as a sole basis for treatment. Nasal washings and aspirates are unacceptable for Xpert Xpress SARS-CoV-2/FLU/RSV testing.  Fact Sheet for  Patients: EntrepreneurPulse.com.au  Fact Sheet for Healthcare Providers: IncredibleEmployment.be  This test is not yet approved or cleared by the [place] FDA and has been authorized for detection and/or diagnosis of SARS-CoV-2 by FDA under an Emergency Use Authorization (EUA). This EUA will remain in effect (meaning this test can be used) for the duration of the COVID-19 declaration under Section 564(b)(1) of the Act, 21 U.S.C. section 360bbb-3(b)(1), unless the authorization is terminated or revoked.  Performed at [facility], [place] [address] [place], [place] [address]       Radiology Studies: DG Chest 1 View  Result Date: 03/06/2021 CLINICAL DATA:  Bilateral leg swelling EXAM: CHEST  1 VIEW COMPARISON:  12/31/2018 FINDINGS: Borderline to mild cardiomegaly with slight central congestion. No focal consolidation, pleural effusion or pneumothorax. Mild aortic atherosclerosis. Surgical hardware in the cervical spine IMPRESSION: Borderline to mild cardiomegaly with slight central congestion. Electronically Signed   By: Donavan Foil M.D.   On: 03/06/2021 22:12       LOS: 0 days   [facility] Pager on [email or web address]  03/07/2021, 9:45 AM

## 2021-03-07 NOTE — ED Notes (Signed)
Pt updated on plan of care. Pt remains sitting up in recliner. NAD noted. Equal rise and fall of chest noted.

## 2021-03-07 NOTE — H&P (Signed)
History and Physical    Maria [name] DVV:[id number] DOB: 06-21-1956 DOA: 03/06/2021  PCP: Biagio Borg, MD  Patient coming from: PCP office   Chief Complaint:  Chief Complaint  Patient presents with  . Leg Swelling  . Flank Pain  . Nausea     HPI:    65 year old female with past medical history of diastolic congestive heart failure (echo 01/2018 EF 65-70%), restless leg syndrome, chronic pain syndrome secondary to chronic back pain, gastroesophageal reflux disease and hypertension who presents to [facility] emergency department at the direction of her primary care provider due to progressively worsening weight gain and peripheral edema.  Patient explains that for the past 2 to 3 weeks that she has been experiencing increasing bilateral lower extremity swelling.  Swelling of the lower extremities is not associated with any fever redness or pain.  Patient also complains of some associated shortness of breath and dyspnea on exertion.  Patient is a swelling is additionally associated with increasing abdominal girth over the same span of time.  As patient symptoms have worsened she reports at least a 10 pound weight gain.  Patient denies any associated chest pain cough or fever.  Patient denies any sick contacts recent travel or contact with confirmed COVID-19 infection.  As the patient's symptoms worsen she noticed that she was having more more difficulty ambulating due to the size and weight of her legs.  Patient eventually presented to see her primary care provider on 3/17 where due to the remarkable increase in her peripheral edema she was advised to go to the emergency department for evaluation and initiation of intravenous diuretics.  Upon evaluation in the emergency department, BNP was noted to be low at 12.6.  Troponin was additionally noted to be normal at 5.  Chest x-ray did reveal some evidence of congestion however.  Patient was given 40 mg of intravenous Lasix and the  hospitalist group was then called to assess the patient for admission the hospital.   5.  Review of Systems:   Review of Systems  Respiratory: Positive for shortness of breath.   Cardiovascular: Positive for leg swelling.  All other systems reviewed and are negative.   Past Medical History:  Diagnosis Date  . Acute lymphadenitis 2011  . ALLERGIC RHINITIS 08/10/2007  . ANXIETY 12/06/2007  . Atherosclerotic peripheral vascular disease ([place]) 06/13/2013   Aorta on CT June 2014  . BACK PAIN 12/06/2007  . Cellulitis 2011  . Cervical disc disease 03/09/2012  . Chronic pain 03/09/2012  . DEPRESSION 12/06/2007  . DIVERTICULOSIS, [place] 12/06/2007  . GERD 12/06/2007  . Hepatitis    age 38 hepatitis A  . HLD (hyperlipidemia) 05/24/2019  . HYPERTENSION 12/06/2007  . Impaired glucose tolerance 03/13/2012  . LOW BACK PAIN 12/06/2007  . Lumbar disc disease 03/09/2012  . Mesenteric adenitis   . MRSA 2006  . Pain    by breast area  . Pain in joint, multiple sites 04/21/2010  . PARONYCHIA, FINGER 08/13/2009  . Pneumonia sept 2015   walking pneumonia, sepsis   . RASH-NONVESICULAR 05/09/2008  . 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 SHOULDER PAIN, LEFT 05/09/2008  . SINUSITIS- ACUTE-NOS 05/09/2008  . THORACIC/LUMBOSACRAL NEURITIS/RADICULITIS UNSPEC 12/28/2008    Past Surgical History:  Procedure Laterality Date  . ABDOMINAL HYSTERECTOMY  1999   1 ovary left  . bloo clot removed from neck   may 25th , june 2. 2010  . [place] SURGERY  may 24th 2010  . COLONOSCOPY  WITH PROPOFOL N/A 07/10/2016   Procedure: COLONOSCOPY WITH PROPOFOL;  Surgeon: Manus Gunning, MD;  Location: [facility];  Service: Gastroenterology;  Laterality: N/A;  . COLONOSCOPY WITH PROPOFOL N/A 09/26/2019   Procedure: COLONOSCOPY WITH PROPOFOL;  Surgeon: Yetta Flock, MD;  Location: [facility];  Service: Gastroenterology;  Laterality: N/A;  . ESOPHAGOGASTRODUODENOSCOPY (EGD) WITH PROPOFOL N/A 07/10/2016    Procedure: ESOPHAGOGASTRODUODENOSCOPY (EGD) WITH PROPOFOL;  Surgeon: Manus Gunning, MD;  Location: [facility];  Service: Gastroenterology;  Laterality: N/A;  . POLYPECTOMY  09/26/2019   Procedure: POLYPECTOMY;  Surgeon: Yetta Flock, MD;  Location: [facility];  Service: Gastroenterology;;  . s/p ovary cyst    . s/p right knee arthroscopy     Dr. Mardelle Matte ortho     reports that she quit smoking about 12 years ago. She quit after 30.00 years of use. She has never used smokeless tobacco. She reports that she does not drink alcohol and does not use drugs.  Allergies  Allergen Reactions  . Amoxicillin-Pot Clavulanate Nausea And Vomiting    Projectile vomiting Did it involve swelling of the face/tongue/throat, SOB, or low BP? No Did it involve sudden or severe rash/hives, skin peeling, or any reaction on the inside of your mouth or nose? No Did you need to seek medical attention at a hospital or doctor's office? No When did it last happen?20-30 years If all above answers are "NO", may proceed with cephalosporin use.   . Adhesive [Tape] Other (See Comments)    Tears skin off - use paper tape   . Codeine Other (See Comments)    hallucinations  . Crestor [Rosuvastatin Calcium]     Severe muscle cramps  . Lactose Intolerance (Gi) Nausea And Vomiting  . Morphine And Related Nausea And Vomiting and Other (See Comments)    Migraine headaches  . Vicodin [Hydrocodone-Acetaminophen] Other (See Comments)    hallucinations  . Latex Rash    Family History  Problem Relation Age of Onset  . Heart disease Mother   . Hypertension Mother   . Diabetes Mother   . Heart failure Mother   . Asthma Sister   . Anxiety disorder Sister   . Depression Sister   . Hypertension Father   . Asthma Daughter   . Bipolar disorder Daughter   . Cancer Maternal Uncle        [place]  . Cancer Other        ovarian  . Liver cancer Paternal Grandmother        ????     Prior to Admission  medications   Medication Sig Start Date End Date Taking? Authorizing Provider  acetaminophen (TYLENOL) 325 MG tablet Take 650 mg by mouth every 6 (six) hours as needed for headache (pain).   Yes [provider]  albuterol (PROAIR HFA) 108 (90 Base) MCG/ACT inhaler Inhale 2 puffs into the lungs every 6 (six) hours as needed for wheezing or shortness of breath. 11/05/20  Yes Colin Benton R, DO  clonazePAM (KLONOPIN) 1 MG tablet TAKE 1 TABLET BY MOUTH 2 TIMES A DAY AS NEEDED FOR ANXIETY Patient taking differently: Take 1 mg by mouth 2 (two) times daily as needed for anxiety. 12/10/20  Yes Biagio Borg, MD  furosemide (LASIX) 40 MG tablet Take 1 tablet (40 mg total) by mouth daily. Patient taking differently: Take 80 mg by mouth daily. 01/31/20  Yes Biagio Borg, MD  LINZESS 145 MCG CAPS capsule Take 145 mcg by mouth daily as  needed (constipation). 08/15/20  Yes [provider]  losartan (COZAAR) 100 MG tablet TAKE 1 TABLET BY MOUTH DAILY Patient taking differently: Take 100 mg by mouth daily. 08/06/20  Yes Biagio Borg, MD  metolazone (ZAROXOLYN) 2.5 MG tablet Take 2.5 mg by mouth daily as needed (fluid). 06/11/20  Yes [provider]  NARCAN 4 MG/0.1ML LIQD nasal spray kit Place 0.4 mg into the nose daily as needed (opioid overdose). 04/25/19  Yes [provider]  ondansetron (ZOFRAN ODT) 4 MG disintegrating tablet Take 1 tablet (4 mg total) by mouth every 4 (four) hours as needed for nausea or vomiting. 08/23/19  Yes Charlesetta Shanks, MD  Oxycodone HCl 10 MG TABS Take 1 tablet (10 mg total) 4 (four) times daily by mouth. Per [facility] Patient taking differently: Take 10 mg by mouth every 4 (four) hours as needed (pain). Per [facility] 11/08/17  Yes Biagio Borg, MD  pantoprazole (PROTONIX) 40 MG tablet TAKE ONE TABLET BY MOUTH DAILY Patient taking differently: Take 40 mg by mouth daily. 03/03/21  Yes Biagio Borg, MD  potassium chloride (MICRO-K) 10  MEQ CR capsule Take 20 mEq by mouth daily. 07/26/20  Yes [provider]  rOPINIRole (REQUIP) 3 MG tablet TAKE 1 TABLET BY MOUTH NIGHTLY AT BEDTIME AS NEEDED Patient taking differently: Take 3 mg by mouth at bedtime as needed (RLS). 02/04/21  Yes Biagio Borg, MD  Tetrahydrozoline HCl (VISINE OP) Place 1 drop into both eyes daily as needed (dry eyes).   Yes [provider]  cyclobenzaprine (FLEXERIL) 10 MG tablet TAKE 1 TABLET BY MOUTH 2 TIMES A DAY AS NEEDED FOR MUSCLE SPASMS Patient taking differently: Take 10 mg by mouth 2 (two) times daily as needed for muscle spasms. 02/27/21   Biagio Borg, MD  rOPINIRole (REQUIP) 3 MG tablet TAKE 1 TABLET BY MOUTH NIGHTLY AT BEDTIME AS NEEDED Patient not taking: Reported on 03/06/2021 02/04/21   Biagio Borg, MD  DULoxetine (CYMBALTA) 60 MG capsule Take 60 mg by mouth daily.    03/09/12  [provider]  fexofenadine (ALLEGRA) 180 MG tablet Take 180 mg by mouth daily.    03/09/12  [provider]    Physical Exam: Vitals:   03/07/21 0157 03/07/21 0230 03/07/21 0300 03/07/21 0400  BP: (!) 156/82 (!) 145/67 (!) 153/65 (!) 170/71  Pulse: 83 78 79 90  Resp: (!) '23 17 17 ' (!) 21  Temp: 98.2 F (36.8 C)     TempSrc: Oral     SpO2: 96% 96% 96% 100%    Constitutional: Acute alert and oriented x3, no associated distress.   Skin: no rashes, no lesions, good skin turgor noted. Eyes: Pupils are equally reactive to light.  No evidence of scleral icterus or conjunctival pallor.  ENMT: Moist mucous membranes noted.  Posterior pharynx clear of any exudate or lesions. Neck: normal, supple, no masses, no thyromegaly.  No evidence of jugular venous distension.   Respiratory: Slightly diminished breath sounds at the bases with minimal bibasilar rales.  Normal respiratory effort. No accessory muscle use.  Cardiovascular: Regular rate and rhythm, no murmurs / rubs / gallops.  Extensive bilateral lower extremity pitting edema that tracks  in the feet all the way up to the thighs.  2+ pedal pulses. No carotid bruits.  Chest:   Nontender without crepitus or deformity.   Back:   Nontender without crepitus or deformity. Abdomen: Abdomen is protuberant but soft and nontender.  No evidence of intra-abdominal masses.  Positive bowel sounds noted in all quadrants.   Musculoskeletal: No joint deformity upper and lower extremities. Good ROM, no contractures. Normal muscle tone.  Neurologic: CN 2-12 grossly intact. Sensation intact.  Patient moving all 4 extremities spontaneously.  Patient is following all commands.  Patient is responsive to verbal stimuli.   Psychiatric: Patient exhibits normal mood with appropriate affect.  Patient seems to possess insight as to their current situation.     Labs on Admission: I have personally reviewed following labs and imaging studies -   CBC: Recent Labs  Lab 03/06/21 1808  WBC 8.6  HGB 12.3  HCT 39.1  MCV 94.0  PLT 092   Basic Metabolic Panel: Recent Labs  Lab 03/06/21 1808  NA 140  K 4.0  CL 104  CO2 27  GLUCOSE 162*  BUN 18  CREATININE 0.96  CALCIUM 8.8*   GFR: Estimated Creatinine Clearance: 96.5 mL/min (by C-G formula based on SCr of 0.96 mg/dL). Liver Function Tests: Recent Labs  Lab 03/06/21 1808  AST 19  ALT 19  ALKPHOS 73  BILITOT 0.7  PROT 7.2  ALBUMIN 3.9   Recent Labs  Lab 03/06/21 1808  LIPASE 36   No results for input(s): AMMONIA in the last 168 hours. Coagulation Profile: No results for input(s): INR, PROTIME in the last 168 hours. Cardiac Enzymes: No results for input(s): CKTOTAL, CKMB, CKMBINDEX, TROPONINI in the last 168 hours. BNP (last 3 results) No results for input(s): PROBNP in the last 8760 hours. HbA1C: No results for input(s): HGBA1C in the last 72 hours. CBG: No results for input(s): GLUCAP in the last 168 hours. Lipid Profile: No results for input(s): CHOL, HDL, LDLCALC, TRIG, CHOLHDL, LDLDIRECT in the last 72 hours. Thyroid  Function Tests: No results for input(s): TSH, T4TOTAL, FREET4, T3FREE, THYROIDAB in the last 72 hours. Anemia Panel: No results for input(s): VITAMINB12, FOLATE, FERRITIN, TIBC, IRON, RETICCTPCT in the last 72 hours. Urine analysis:    Component Value Date/Time   COLORURINE YELLOW 03/06/2021 1751   APPEARANCEUR CLEAR 03/06/2021 1751   LABSPEC 1.015 03/06/2021 1751   PHURINE 5.0 03/06/2021 1751   GLUCOSEU NEGATIVE 03/06/2021 1751   GLUCOSEU NEGATIVE 12/31/2020 1404   HGBUR NEGATIVE 03/06/2021 1751   BILIRUBINUR NEGATIVE 03/06/2021 1751   KETONESUR NEGATIVE 03/06/2021 1751   PROTEINUR NEGATIVE 03/06/2021 1751   UROBILINOGEN 0.2 12/31/2020 1404   NITRITE NEGATIVE 03/06/2021 1751   LEUKOCYTESUR MODERATE (A) 03/06/2021 1751    Radiological Exams on Admission - Personally Reviewed: DG Chest 1 View  Result Date: 03/06/2021 CLINICAL DATA:  Bilateral leg swelling EXAM: CHEST  1 VIEW COMPARISON:  12/31/2018 FINDINGS: Borderline to mild cardiomegaly with slight central congestion. No focal consolidation, pleural effusion or pneumothorax. Mild aortic atherosclerosis. Surgical hardware in the cervical spine IMPRESSION: Borderline to mild cardiomegaly with slight central congestion. Electronically Signed   By: Donavan Foil M.D.   On: 03/06/2021 22:12    EKG: Personally reviewed.  Rhythm is normal sinus rhythm with heart rate of 84 bpm.  No dynamic ST segment changes appreciated.  Assessment/Plan Principal Problem:   Acute on chronic diastolic (congestive) heart failure ([place])   Patient presenting with substantial peripheral edema, increasing abdominal girth and weight gain  BNP is unremarkable however chest x-ray does reveal some evidence of vascular congestion.  Patient's sick nephric and peripheral edema is likely multifactorial secondary to acute diastolic congestive heart failure superimposed on already present lymphedema  Patient would likely respond to  several days of intravenous  diuretics and therefore 40 mg IV twice daily of intravenous Lasix has been initiated.  Echocardiogram ordered for the morning  Monitoring patient on telemetry  Strict input and output monitoring  Daily weights  Monitoring renal function and electrolytes with serial chemistries.  Active Problems:   Essential hypertension  . Resume patients home regimen or oral antihypertensives . Titrate antihypertensive regimen as necessary to achieve adequate BP control . PRN intravenous antihypertensives for excessively elevated blood pressure    Chronic low back pain   Home regimen of opiate-based analgesics verified via the controlled substance database  Home medication regimen has been resumed.  Patient is complaining of severe low back pain lying in the emergency room gurney and therefore short course of low-dose intravenous Dilaudid was administered.    Hyperglycemia   Notable hyperglycemia on initial blood work  Continue hemoglobin A1c    GERD without esophagitis   Continue home regimen of daily PPI    Restless leg syndrome    Continue home regimen of ropinirole nightly  Class III obesity secondary to excess calories with serious comorbidity   Counseling patient daily on caloric restriction and regular physical activity.  Code Status:  Full code Family Communication: deferred   Status is: Observation  The patient remains OBS appropriate and will d/c before 2 midnights.  Dispo: The patient is from: Home              Anticipated d/c is to: Home              Patient currently is not medically stable to d/c.   Difficult to place patient No        Vernelle Emerald MD [facility] Pager [phone]  If 7PM-7AM, please contact night-coverage [email or web address] Use universal [place] password for that web site. If you do not have the password, please call the hospital operator.  03/07/2021, 4:35 AM

## 2021-03-07 NOTE — ED Notes (Signed)
Pt assisted to recliner brought to pt room.

## 2021-03-07 NOTE — ED Notes (Signed)
Patient requesting pain medication. Dr. Cyd Silence notified.

## 2021-03-08 ENCOUNTER — Inpatient Hospital Stay (HOSPITAL_COMMUNITY): Payer: Medicare Other

## 2021-03-08 DIAGNOSIS — I5033 Acute on chronic diastolic (congestive) heart failure: Secondary | ICD-10-CM | POA: Diagnosis not present

## 2021-03-08 DIAGNOSIS — I10 Essential (primary) hypertension: Secondary | ICD-10-CM | POA: Diagnosis not present

## 2021-03-08 DIAGNOSIS — G8929 Other chronic pain: Secondary | ICD-10-CM | POA: Diagnosis not present

## 2021-03-08 DIAGNOSIS — M545 Low back pain, unspecified: Secondary | ICD-10-CM | POA: Diagnosis not present

## 2021-03-08 LAB — BASIC METABOLIC PANEL
Anion gap: 10 (ref 5–15)
BUN: 15 mg/dL (ref 8–23)
CO2: 31 mmol/L (ref 22–32)
Calcium: 8.7 mg/dL — ABNORMAL LOW (ref 8.9–10.3)
Chloride: 100 mmol/L (ref 98–111)
Creatinine, Ser: 0.97 mg/dL (ref 0.44–1.00)
GFR, Estimated: >60 mL/min (ref >60)
Glucose, Bld: 127 mg/dL — ABNORMAL HIGH (ref 70–99)
Potassium: 3.6 mmol/L (ref 3.5–5.1)
Sodium: 141 mmol/L (ref 135–145)

## 2021-03-08 LAB — CBC
HCT: 37.7 % (ref 36.0–46.0)
Hemoglobin: 12 g/dL (ref 12.0–15.0)
MCH: 29.9 pg (ref 26.0–34.0)
MCHC: 31.8 g/dL (ref 30.0–36.0)
MCV: 93.8 fL (ref 80.0–100.0)
Platelets: 253 10*3/uL (ref 150–400)
RBC: 4.02 MIL/uL (ref 3.87–5.11)
RDW: 12.7 % (ref 11.5–15.5)
WBC: 7.4 10*3/uL (ref 4.0–10.5)
nRBC: 0 % (ref 0.0–0.2)

## 2021-03-08 LAB — ECHOCARDIOGRAM COMPLETE
Area-P 1/2: 2.99 cm2
Height: 65 in
S' Lateral: 3.3 cm
Weight: 5813.09 oz

## 2021-03-08 MED ORDER — PERFLUTREN LIPID MICROSPHERE
1.0000 mL | INTRAVENOUS | Status: AC | PRN
  Administered 2021-03-08: 2 mL via INTRAVENOUS
  Filled 2021-03-08: qty 10

## 2021-03-08 MED ORDER — ONDANSETRON 4 MG PO TBDP
4.0000 mg | ORAL_TABLET | Freq: Three times a day (TID) | ORAL | Status: DC | PRN
  Administered 2021-03-08: 4 mg via ORAL
  Filled 2021-03-08: qty 1

## 2021-03-08 MED ORDER — CLONAZEPAM 1 MG PO TABS
1.0000 mg | ORAL_TABLET | Freq: Two times a day (BID) | ORAL | Status: DC | PRN
  Administered 2021-03-08 – 2021-03-10 (×5): 1 mg via ORAL
  Filled 2021-03-08 (×5): qty 1

## 2021-03-08 MED ORDER — POTASSIUM CHLORIDE CRYS ER 20 MEQ PO TBCR
40.0000 meq | EXTENDED_RELEASE_TABLET | Freq: Once | ORAL | Status: AC
  Administered 2021-03-08: 40 meq via ORAL
  Filled 2021-03-08: qty 2

## 2021-03-08 NOTE — Progress Notes (Signed)
  Echocardiogram 2D Echocardiogram has been performed.  Maria [name] G Delita Chiquito 03/08/2021, 11:50 AM

## 2021-03-08 NOTE — Evaluation (Signed)
Occupational Therapy Evaluation Patient Details Name: Maria [name] MRN: [id number] DOB: 06-28-1956 Today's Date: 03/08/2021    History of Present Illness 65 year old female with past medical history of diastolic congestive heart failure (echo 01/2018 EF 65-70%), restless leg syndrome, chronic pain syndrome secondary to chronic back pain, gastroesophageal reflux disease and hypertension who presented to [facility] long hospital emergency department at the direction of her primary care provider due to progressively worsening weight gain and peripheral edema.  Pt admitted for Acute on chronic diastolic CHF/anasarca   Clinical Impression   Patient is currently requiring assistance with ADLs including minimal assist with toileting, with LE dressing, and bathing  which is below patient's typical baseline of being Modified independent.  During this evaluation, patient was limited by recent worsening of Bil knee pain as well as back pain with subsequent decreased tolerance to standing and increasing difficulty with bathing and performing all IADLs, which has the potential to impact patient's safety and independence during functional mobility, as well as performance for ADLs. Norris "6-clicks" Daily Activity Inpatient Short Form score of 21/24 indicates 32.79% ADL impairment this session. Patient lives alone with supportive friends but no consistent assistance available. Patient demonstrates good rehab potential, and should benefit from continued skilled occupational therapy services while in acute care to maximize safety, independence and quality of life at home.  Continued occupational therapy services in the home is recommended.  ?    Follow Up Recommendations  Home health OT    Equipment Recommendations       Recommendations for Other Services       Precautions / Restrictions Precautions Precautions: None Restrictions Weight Bearing Restrictions: No      Mobility Bed  Mobility Overal bed mobility: Needs Assistance Bed Mobility: Supine to Sit;Sit to Supine     Supine to sit: Supervision;HOB elevated Sit to supine: Mod assist   General bed mobility comments: Pt required moderate assist to raise each leg individually onto bed. Pt able to control trunk.    Transfers Overall transfer level: Needs assistance Equipment used: Rolling walker (2 wheeled) Transfers: Sit to/from Stand Sit to Stand: Min guard              Balance Overall balance assessment: Needs assistance   Sitting balance-Leahy Scale: Good Sitting balance - Comments: Pt can reach down to feet and adjust socks without loss of balance.   Standing balance support: Bilateral upper extremity supported Standing balance-Leahy Scale: Poor                             ADL either performed or assessed with clinical judgement   ADL Overall ADL's : Needs assistance/impaired Eating/Feeding: Independent   Grooming: Wash/dry hands;Wash/dry face;Modified independent;Standing   Upper Body Bathing: Sitting;Modified independent   Lower Body Bathing: Supervison/ safety;Sitting/lateral leans;Sit to/from stand   Upper Body Dressing : Modified independent;Sitting   Lower Body Dressing: Min guard;Sitting/lateral leans;Sit to/from stand   Toilet Transfer: Min guard;BSC;RW   Toileting- Water quality scientist and Hygiene: Min guard;Sitting/lateral lean;Sit to/from stand Toileting - Water quality scientist Details (indicate cue type and reason): Pt may benefit from education on adaptive equipment options including attachable bidet.   Tub/Shower Transfer Details (indicate cue type and reason): Pt may require a bariatric tub bench. Will continue to assess. Functional mobility during ADLs: Min guard;Rolling walker       Vision Baseline Vision/History: Wears glasses Wears Glasses: At all times Vision Assessment?: No apparent visual deficits  Perception     Praxis      Pertinent  Vitals/Pain Pain Assessment: 0-10 Pain Score: 5  Faces Pain Scale: Hurts little more Pain Location: bil knees and back, chronic with increasing pain of RT knee since weight gain. Pain Descriptors / Indicators: Sore;Aching Pain Intervention(s): Limited activity within patient's tolerance;Repositioned;Monitored during session     Hand Dominance Right   Extremity/Trunk Assessment Upper Extremity Assessment Upper Extremity Assessment: Generalized weakness   Lower Extremity Assessment Lower Extremity Assessment: Overall WFL for tasks assessed   Cervical / Trunk Assessment Cervical / Trunk Assessment: Normal   Communication Communication Communication: No difficulties   Cognition Arousal/Alertness: Awake/alert Behavior During Therapy: WFL for tasks assessed/performed Overall Cognitive Status: Within Functional Limits for tasks assessed                                     General Comments       Exercises     Shoulder Instructions      Home Living Family/patient expects to be discharged to:: Private residence Living Arrangements: Alone Available Help at Discharge: Friend(s) Type of Home: Apartment Home Access: Stairs to enter [employer] of Steps: 6 steps that are rather uneven, where some are normal steps and some more like a platform. Pt reports that her apartment complex is supposed to build her a ramp this Spring. Entrance Stairs-Rails: Right Home Layout: One level     Bathroom Shower/Tub: Teacher, early years/pre: Standard (bedside commode on top.)     Home Equipment: Cane - single point;Walker - 4 wheels;Bedside commode;Shower seat;Hand held shower head;Grab bars - tub/shower;Adaptive equipment Adaptive Equipment: Reacher;Sock aid;Long-handled shoe horn;Long-handled sponge Additional Comments: Has 2nd bedside commode to use in living room as needed.  Has suction cup style grab bar that she has not used yet. Pt cautioned regarding  safety risks associated with suction cup grab bars.      Prior Functioning/Environment Level of Independence: Independent with assistive device(s);Needs assistance    ADL's / Homemaking Assistance Needed: Pt reports Mod I with BADLs with brief stand ups for showering due to knee pain, but reports recent increased difficulty with IADLs particularly cleaning her bathroom adequately.  Pt does not drive, has transportation through insurance for doctor's appointments and uses [employer] for groceries.   Comments: uses SPC typically however has been using rollator due to back and knee pain these past few weeks        OT Problem List: Decreased strength;Pain;Increased edema;Decreased activity tolerance;Impaired balance (sitting and/or standing);Decreased knowledge of use of DME or AE      OT Treatment/Interventions: Self-care/ADL training;Therapeutic exercise;Therapeutic activities;Energy conservation;DME and/or AE instruction;Patient/family education;Balance training    OT Goals(Current goals can be found in the care plan section) Acute Rehab OT Goals Patient Stated Goal: To be able to cook a meal in the kitchen and clean bathroom. OT Goal Formulation: With patient Time For Goal Achievement: 03/22/21 Potential to Achieve Goals: Good ADL Goals Pt Will Perform Lower Body Dressing: with adaptive equipment;with modified independence;sitting/lateral leans;sit to/from stand Pt Will Transfer to Toilet: with modified independence Pt Will Perform Toileting - Clothing Manipulation and hygiene: with modified independence;with adaptive equipment Pt Will Perform Tub/Shower Transfer: with modified independence;grab bars;shower seat Additional ADL Goal #1: Patient will tolerate BUE home exercise program 10-15 reps with thera-band or weights within pain-free ranges, in an unsupported seated position, in order to improve upper body strength, endurance  and core stability needed to complete home ADLs.  OT  Frequency: Min 2X/week   Barriers to D/C: Decreased caregiver support          Co-evaluation              AM-PAC OT "6 Clicks" Daily Activity     Outcome Measure Help from another person eating meals?: None Help from another person taking care of personal grooming?: None Help from another person toileting, which includes using toliet, bedpan, or urinal?: A Little Help from another person bathing (including washing, rinsing, drying)?: A Little Help from another person to put on and taking off regular upper body clothing?: None Help from another person to put on and taking off regular lower body clothing?: A Little 6 Click Score: 21   End of Session Equipment Utilized During Treatment: Rolling walker  Activity Tolerance: Patient limited by pain Patient left: in bed;with call bell/phone within reach;with bed alarm set  OT Visit Diagnosis: Unsteadiness on feet (R26.81);Pain Pain - Right/Left:  (Bilateral) Pain - part of body: Knee                Time: 5726-2035 OT Time Calculation (min): 40 min Charges:  OT General Charges $OT Visit: 1 Visit OT Evaluation $OT Eval Low Complexity: 1 Low OT Treatments $Self Care/Home Management : 8-22 mins  Anderson Malta, [place] Office: [phone] 03/08/2021 Julien Girt 03/08/2021, 4:27 PM

## 2021-03-08 NOTE — Evaluation (Addendum)
Physical Therapy Evaluation Patient Details Name: Maria [name] MRN: [id number] DOB: [birth year] Today's Date: 03/08/2021   History of Present Illness  65 year old female with past medical history of diastolic congestive heart failure (echo 01/2018 EF 65-70%), restless leg syndrome, chronic pain syndrome secondary to chronic back pain, gastroesophageal reflux disease and hypertension who presented to [facility] long hospital emergency department at the direction of her primary care provider due to progressively worsening weight gain and peripheral edema.  Pt admitted for Acute on chronic diastolic CHF/anasarca  Clinical Impression  Pt admitted with above diagnosis.  Pt currently with functional limitations due to the deficits listed below (see PT Problem List). Pt will benefit from skilled PT to increase their independence and safety with mobility to allow discharge to the venue listed below.  Pt ambulated in hallway and reports knee pain however chronic.  Pt also with edematous LEs so encouraged elevating LE's at rest and performing ankle pumps. Pt's SPo2 96-99% on room air during ambulation.       Follow Up Recommendations Home Health PT    Equipment Recommendations  None recommended by PT    Recommendations for Other Services       Precautions / Restrictions Precautions Precautions: None      Mobility  Bed Mobility               General bed mobility comments: pt in recliner    Transfers Overall transfer level: Needs assistance Equipment used: Rolling walker (2 wheeled) Transfers: Sit to/from Stand Sit to Stand: Min guard            Ambulation/Gait Ambulation/Gait assistance: Min guard Gait Distance (Feet): 80 Feet Assistive device: Rolling walker (2 wheeled) Gait Pattern/deviations: Step-through pattern;Decreased stride length     General Gait Details: verbal cues for RW positioning, SPO2 96-99% on room air, pt reports mild dyspnea end of session  Stairs             Wheelchair Mobility    Modified Rankin (Stroke Patients Only)       Balance Overall balance assessment: Needs assistance         Standing balance support: Bilateral upper extremity supported Standing balance-Leahy Scale: Poor                               Pertinent Vitals/Pain Pain Assessment: Faces Faces Pain Scale: Hurts little more Pain Location: bil knees Pain Descriptors / Indicators: Sore;Aching Pain Intervention(s): Limited activity within patient's tolerance;Repositioned    Home Living Family/patient expects to be discharged to:: Private residence Living Arrangements: Alone Available Help at Discharge: Friend(s) Type of Home: Apartment       Home Layout: One level Home Equipment: Cane - single point;Walker - 4 wheels      Prior Function Level of Independence: Independent with assistive device(s)         Comments: uses SPC typically however has been using rollator due to back and knee pain these past few weeks     Hand Dominance        Extremity/Trunk Assessment        Lower Extremity Assessment Lower Extremity Assessment: Overall WFL for tasks assessed    Cervical / Trunk Assessment Cervical / Trunk Assessment: Normal  Communication   Communication: No difficulties  Cognition Arousal/Alertness: Awake/alert Behavior During Therapy: WFL for tasks assessed/performed Overall Cognitive Status: Within Functional Limits for tasks assessed  General Comments      Exercises     Assessment/Plan    PT Assessment Patient needs continued PT services  PT Problem List Decreased balance;Decreased strength;Decreased mobility;Cardiopulmonary status limiting activity;Decreased knowledge of use of DME;Pain;Decreased activity tolerance       PT Treatment Interventions Gait training;DME instruction;Therapeutic exercise;Balance training;Functional mobility  training;Therapeutic activities;Patient/family education    PT Goals (Current goals can be found in the Care Plan section)  Acute Rehab PT Goals PT Goal Formulation: With patient Time For Goal Achievement: 03/15/21 Potential to Achieve Goals: Good    Frequency Min 3X/week   Barriers to discharge        Co-evaluation               AM-PAC PT "6 Clicks" Mobility  Outcome Measure Help needed turning from your back to your side while in a flat bed without using bedrails?: A Little Help needed moving from lying on your back to sitting on the side of a flat bed without using bedrails?: A Little Help needed moving to and from a bed to a chair (including a wheelchair)?: A Little Help needed standing up from a chair using your arms (e.g., wheelchair or bedside chair)?: A Little Help needed to walk in hospital room?: A Little Help needed climbing 3-5 steps with a railing? : A Little 6 Click Score: 18    End of Session   Activity Tolerance: Patient tolerated treatment well Patient left: with call bell/phone within reach;in chair Nurse Communication: Mobility status PT Visit Diagnosis: Other abnormalities of gait and mobility (R26.89)    Time: 5427-0623 PT Time Calculation (min) (ACUTE ONLY): 18 min   Charges:   PT Evaluation $PT Eval Low Complexity: 1 Low        Kati PT, DPT Acute Rehabilitation Services Pager: [phone] Office: [phone]  York Ram E 03/08/2021, 1:22 PM

## 2021-03-08 NOTE — Progress Notes (Signed)
[facility] PROGRESS NOTE   Maria [name] ZOX:[id number] DOB: 01-Jun-1956 DOA: 03/06/2021  PCP: Biagio Borg, MD  Brief History/Interval Summary: 65 year old female with past medical history of diastolic congestive heart failure (echo 01/2018 EF 65-70%), restless leg syndrome, chronic pain syndrome secondary to chronic back pain, gastroesophageal reflux disease and hypertension who presented to [facility] hospital emergency department at the direction of her primary care provider due to progressively worsening weight gain and peripheral edema.  Patient had been taking furosemide at home despite which she experiences weight gain and worsening lower extremity edema.  She has been hospitalized for intravenous diuretics.  Consultants: None  Procedures: Transthoracic echocardiogram has been ordered  Antibiotics: Anti-infectives (From admission, onward)   None      Subjective/Interval History: Patient mentions that she is feeling slightly better.  Leg swelling is improving.  Shortness of breath is improving.  Denies any chest pain.  No nausea or vomiting.      Assessment/Plan:  Acute on chronic diastolic CHF/anasarca Patient has been on 40 mg of furosemide at home despite which she has experienced significant weight gain and worsening lower extremity edema over the last few weeks.  Echocardiogram has been ordered.  Last echocardiogram is from 2019 which showed normal systolic function but did show grade 1 diastolic dysfunction.  Patient denies any history of sleep apnea and has been tested for this previously.  Continue strict ins and outs and daily weights.   Patient appears to be diuresing well.  Continue intravenous furosemide for now.  Echocardiogram is pending.    Essential hypertension Occasional high readings noted.  Continue Cozaar.    Chronic low back pain/chronic knee pain Patient noted to be on oxycodone 10 mg up to 4 times daily as needed for her pain issues at  home.  This is being continued.  PT and OT evaluation.  Patient lives in an apartment.  Has been using a cane to ambulate over the last few days.  Hyperglycemia Glucose noted to be 133 this morning.  HbA1c 6.5.  No known history of diabetes previously.  Patient might benefit from being on Metformin at discharge.  Outpatient follow-up with PCP.  History of GERD without esophagitis Continue PPI  Restless leg syndrome Continue ropinirole.  Class III obesity Estimated body mass index is 60.46 kg/m as calculated from the following:   Height as of this encounter: 5\' 5"  (1.651 m).   Weight as of this encounter: 164.8 kg.   DVT Prophylaxis: On Lovenox.  Code Status: Full code Family Communication: Discussed with the patient Disposition Plan: Hopefully return home in a few days  Status is: Inpatient  Remains inpatient appropriate because:IV treatments appropriate due to intensity of illness or inability to take PO   Dispo:  Patient From: Home  Planned Disposition: Home  Medically stable for discharge: No       Medications:  Scheduled: . enoxaparin (LOVENOX) injection  80 mg Subcutaneous Q24H  . furosemide  40 mg Intravenous BID  . losartan  100 mg Oral Daily  . pantoprazole  40 mg Oral Daily  . sodium chloride flush  3 mL Intravenous Q12H   Continuous: . sodium chloride     WJX:BJYNWG chloride, acetaminophen, albuterol, clonazePAM, cyclobenzaprine, linaclotide, ondansetron (ZOFRAN) IV, ondansetron, oxyCODONE, rOPINIRole, sodium chloride flush, tetrahydrozoline   Objective:  Vital Signs  Vitals:   03/07/21 1549 03/07/21 2222 03/08/21 0500 03/08/21 0618  BP:  (!) 115/54  (!) 167/62  Pulse:  78  82  Resp:  18  18  Temp:  98.5 F (36.9 C)  98.1 F (36.7 C)  TempSrc:  Oral  Oral  SpO2:  92%  97%  Weight: (!) 166.1 kg  (!) 164.8 kg   Height: 5\' 5"  (1.651 m)       Intake/Output Summary (Last 24 hours) at 03/08/2021 1125 Last data filed at 03/08/2021 1100 Gross per  24 hour  Intake 123 ml  Output 3550 ml  Net -3427 ml   Filed Weights   03/07/21 1549 03/08/21 0500  Weight: (!) 166.1 kg (!) 164.8 kg    General appearance: Awake alert.  In no distress Resp: Clear to auscultation bilaterally.  Normal effort Cardio: S1-S2 is normal regular.  No S3-S4.  No rubs murmurs or bruit GI: Abdomen is soft.  Nontender nondistended.  Bowel sounds are present normal.  No masses organomegaly Extremities: 2+ pitting edema bilateral lower extremity. Neurologic: Alert and oriented x3.  No focal neurological deficits.     Lab Results:  Data Reviewed: I have personally reviewed following labs and imaging studies  CBC: Recent Labs  Lab 03/06/21 1808 03/07/21 0515 03/08/21 0519  WBC 8.6 8.7 7.4  NEUTROABS  --  5.1  --   HGB 12.3 11.7* 12.0  HCT 39.1 36.7 37.7  MCV 94.0 93.6 93.8  PLT 286 250 938    Basic Metabolic Panel: Recent Labs  Lab 03/06/21 1808 03/07/21 0515 03/08/21 0519  NA 140 139 141  K 4.0 3.6 3.6  CL 104 102 100  CO2 27 28 31   GLUCOSE 162* 133* 127*  BUN 18 14 15   CREATININE 0.96 0.90 0.97  CALCIUM 8.8* 8.7* 8.7*  MG  --  2.0  --     GFR: Estimated Creatinine Clearance: 93.8 mL/min (by C-G formula based on SCr of 0.97 mg/dL).  Liver Function Tests: Recent Labs  Lab 03/06/21 1808 03/07/21 0515  AST 19 18  ALT 19 20  ALKPHOS 73 58  BILITOT 0.7 1.0  PROT 7.2 6.8  ALBUMIN 3.9 3.8    Recent Labs  Lab 03/06/21 1808  LIPASE 36    HbA1C: Recent Labs    03/07/21 0515  HGBA1C 6.5*     Recent Results (from the past 240 hour(s))  Resp Panel by RT-PCR (Flu A&B, Covid) Nasopharyngeal Swab     Status: None   Collection Time: 03/06/21  8:37 PM   Specimen: Nasopharyngeal Swab; Nasopharyngeal(NP) swabs in vial transport medium  Result Value Ref Range Status   SARS Coronavirus 2 by RT PCR NEGATIVE NEGATIVE Final    Comment: (NOTE) SARS-CoV-2 target nucleic acids are NOT DETECTED.  The SARS-CoV-2 RNA is generally  detectable in upper respiratory specimens during the acute phase of infection. The lowest concentration of SARS-CoV-2 viral copies this assay can detect is 138 copies/mL. A negative result does not preclude SARS-Cov-2 infection and should not be used as the sole basis for treatment or other patient management decisions. A negative result may occur with  improper specimen collection/handling, submission of specimen other than nasopharyngeal swab, presence of viral mutation(s) within the areas targeted by this assay, and inadequate number of viral copies(<138 copies/mL). A negative result must be combined with clinical observations, patient history, and epidemiological information. The expected result is Negative.  Fact Sheet for Patients:  EntrepreneurPulse.com.au  Fact Sheet for Healthcare Providers:  IncredibleEmployment.be  This test is no t yet approved or cleared by the [place] FDA and  has been authorized for detection and/or diagnosis  of SARS-CoV-2 by FDA under an Emergency Use Authorization (EUA). This EUA will remain  in effect (meaning this test can be used) for the duration of the COVID-19 declaration under Section 564(b)(1) of the Act, 21 U.S.C.section 360bbb-3(b)(1), unless the authorization is terminated  or revoked sooner.       Influenza A by PCR NEGATIVE NEGATIVE Final   Influenza B by PCR NEGATIVE NEGATIVE Final    Comment: (NOTE) The Xpert Xpress SARS-CoV-2/FLU/RSV plus assay is intended as an aid in the diagnosis of influenza from Nasopharyngeal swab specimens and should not be used as a sole basis for treatment. Nasal washings and aspirates are unacceptable for Xpert Xpress SARS-CoV-2/FLU/RSV testing.  Fact Sheet for Patients: EntrepreneurPulse.com.au  Fact Sheet for Healthcare Providers: IncredibleEmployment.be  This test is not yet approved or cleared by the [place] FDA  and has been authorized for detection and/or diagnosis of SARS-CoV-2 by FDA under an Emergency Use Authorization (EUA). This EUA will remain in effect (meaning this test can be used) for the duration of the COVID-19 declaration under Section 564(b)(1) of the Act, 21 U.S.C. section 360bbb-3(b)(1), unless the authorization is terminated or revoked.  Performed at [facility], [place] [address], [place], [place] [address]       Radiology Studies: DG Chest 1 View  Result Date: 03/06/2021 CLINICAL DATA:  Bilateral leg swelling EXAM: CHEST  1 VIEW COMPARISON:  12/31/2018 FINDINGS: Borderline to mild cardiomegaly with slight central congestion. No focal consolidation, pleural effusion or pneumothorax. Mild aortic atherosclerosis. Surgical hardware in the cervical spine IMPRESSION: Borderline to mild cardiomegaly with slight central congestion. Electronically Signed   By: Donavan Foil M.D.   On: 03/06/2021 22:12       LOS: 1 day   [facility] Pager on [email or web address]  03/08/2021, 11:25 AM

## 2021-03-09 ENCOUNTER — Encounter: Payer: Self-pay | Admitting: Internal Medicine

## 2021-03-09 DIAGNOSIS — M545 Low back pain, unspecified: Secondary | ICD-10-CM | POA: Diagnosis not present

## 2021-03-09 DIAGNOSIS — I5033 Acute on chronic diastolic (congestive) heart failure: Secondary | ICD-10-CM | POA: Diagnosis not present

## 2021-03-09 DIAGNOSIS — G8929 Other chronic pain: Secondary | ICD-10-CM | POA: Diagnosis not present

## 2021-03-09 DIAGNOSIS — I10 Essential (primary) hypertension: Secondary | ICD-10-CM | POA: Diagnosis not present

## 2021-03-09 LAB — BASIC METABOLIC PANEL
Anion gap: 12 (ref 5–15)
BUN: 16 mg/dL (ref 8–23)
CO2: 30 mmol/L (ref 22–32)
Calcium: 8.5 mg/dL — ABNORMAL LOW (ref 8.9–10.3)
Chloride: 99 mmol/L (ref 98–111)
Creatinine, Ser: 0.99 mg/dL (ref 0.44–1.00)
GFR, Estimated: >60 mL/min (ref >60)
Glucose, Bld: 113 mg/dL — ABNORMAL HIGH (ref 70–99)
Potassium: 3.5 mmol/L (ref 3.5–5.1)
Sodium: 141 mmol/L (ref 135–145)

## 2021-03-09 LAB — CBC
HCT: 39.1 % (ref 36.0–46.0)
Hemoglobin: 12.3 g/dL (ref 12.0–15.0)
MCH: 30 pg (ref 26.0–34.0)
MCHC: 31.5 g/dL (ref 30.0–36.0)
MCV: 95.4 fL (ref 80.0–100.0)
Platelets: 261 10*3/uL (ref 150–400)
RBC: 4.1 MIL/uL (ref 3.87–5.11)
RDW: 12.7 % (ref 11.5–15.5)
WBC: 7.5 10*3/uL (ref 4.0–10.5)
nRBC: 0 % (ref 0.0–0.2)

## 2021-03-09 MED ORDER — POTASSIUM CHLORIDE CRYS ER 20 MEQ PO TBCR
40.0000 meq | EXTENDED_RELEASE_TABLET | Freq: Two times a day (BID) | ORAL | Status: AC
  Administered 2021-03-09 (×2): 40 meq via ORAL
  Filled 2021-03-09 (×2): qty 2

## 2021-03-09 MED ORDER — ROPINIROLE HCL 1 MG PO TABS
3.0000 mg | ORAL_TABLET | Freq: Every day | ORAL | Status: DC
Start: 2021-03-09 — End: 2021-03-11
  Administered 2021-03-09 – 2021-03-10 (×2): 3 mg via ORAL
  Filled 2021-03-09 (×2): qty 3

## 2021-03-09 NOTE — Progress Notes (Signed)
[facility] PROGRESS NOTE   Glendell Schlottman Simenson DPO:[id number] DOB: 08-12-1956 DOA: 03/06/2021  PCP: Biagio Borg, MD  Brief History/Interval Summary: 65 year old female with past medical history of diastolic congestive heart failure (echo 01/2018 EF 65-70%), restless leg syndrome, chronic pain syndrome secondary to chronic back pain, gastroesophageal reflux disease and hypertension who presented to [facility] emergency department at the direction of her primary care provider due to progressively worsening weight gain and peripheral edema.  Patient had been taking furosemide at home despite which she experiences weight gain and worsening lower extremity edema.  She has been hospitalized for intravenous diuretics.  Consultants: None  Procedures:  Transthoracic echocardiogram 3/19 1. Left ventricular ejection fraction, by estimation, is 60 to 65%. The  left ventricle has normal function. The left ventricle has no regional  wall motion abnormalities. Left ventricular diastolic parameters are  consistent with Grade I diastolic  dysfunction (impaired relaxation). Elevated left ventricular end-diastolic  pressure.  2. Right ventricular systolic function is normal. The right ventricular  size is normal. Tricuspid regurgitation signal is inadequate for assessing  PA pressure.  3. The mitral valve is normal in structure. No evidence of mitral valve  regurgitation. No evidence of mitral stenosis.  4. The aortic valve is normal in structure. Aortic valve regurgitation is  not visualized. No aortic stenosis is present.  5. The inferior vena cava is normal in size with greater than 50%  respiratory variability, suggesting right atrial pressure of 3 mmHg.   Antibiotics: Anti-infectives (From admission, onward)   None      Subjective/Interval History: Patient remains anxious but overall she is feeling better.  Neck swelling is improving.  Shortness of breath is much better.   Denies any chest pain.       Assessment/Plan:  Acute on chronic diastolic CHF/anasarca Patient has been on 40 mg of furosemide at home despite which she has experienced significant weight gain and worsening lower extremity edema over the last few weeks prior to admission.   Echocardiogram shows normal systolic function.  Grade 1 diastolic dysfunction noted.   Patient remains on IV furosemide.  She is diuresing well.  Weight is decreasing, she has lost about 13 pounds here in the hospital.  Continue IV furosemide for at least 1 more day and then change to oral tomorrow. Patient told about her echocardiogram findings. Continue to mobilize.  Essential hypertension Occasional high readings noted.  Continue Cozaar.    Chronic low back pain/chronic knee pain Patient noted to be on oxycodone 10 mg up to 4 times daily as needed for her pain issues at home.  This is being continued.  PT and OT evaluation.  Patient lives in an apartment.  Has been using a cane to ambulate over the last few days.  Reactive HIV test Confirmatory tests are pending.  Discussed this result with patient.  She mentions that she has had a positive test result previously as well, several years ago, and when they did confirmatory tests, it was negative.  Hyperglycemia Glucose noted to be 133 this morning.  HbA1c 6.5.  No known history of diabetes previously.  Patient might benefit from being on Metformin at discharge.  Outpatient follow-up with PCP.  History of GERD without esophagitis Continue PPI  Restless leg syndrome Continue ropinirole.  Class III obesity Estimated body mass index is 59.91 kg/m as calculated from the following:   Height as of this encounter: 5\' 5"  (1.651 m).   Weight as of this encounter:  163.3 kg.   DVT Prophylaxis: On Lovenox.  Code Status: Full code Family Communication: Discussed with the patient Disposition Plan: Hopefully return home in 24 to 48 hours.  Status is:  Inpatient  Remains inpatient appropriate because:IV treatments appropriate due to intensity of illness or inability to take PO   Dispo:  Patient From: Home  Planned Disposition: Home  Medically stable for discharge: No       Medications:  Scheduled: . enoxaparin (LOVENOX) injection  80 mg Subcutaneous Q24H  . furosemide  40 mg Intravenous BID  . losartan  100 mg Oral Daily  . pantoprazole  40 mg Oral Daily  . sodium chloride flush  3 mL Intravenous Q12H   Continuous: . sodium chloride     ALP:FXTKWI chloride, acetaminophen, albuterol, clonazePAM, cyclobenzaprine, linaclotide, ondansetron (ZOFRAN) IV, ondansetron, oxyCODONE, rOPINIRole, sodium chloride flush, tetrahydrozoline   Objective:  Vital Signs  Vitals:   03/08/21 1319 03/08/21 2056 03/09/21 0425 03/09/21 0429  BP: (!) 148/54 137/68 (!) 142/63   Pulse: 80 83 74   Resp: 14  16   Temp: 98.4 F (36.9 C) 98.5 F (36.9 C) 98.2 F (36.8 C)   TempSrc: Oral Oral Oral   SpO2: 96% 96% 100%   Weight:    (!) 163.3 kg  Height:        Intake/Output Summary (Last 24 hours) at 03/09/2021 1124 Last data filed at 03/09/2021 0900 Gross per 24 hour  Intake 3 ml  Output 2400 ml  Net -2397 ml   Filed Weights   03/07/21 1549 03/08/21 0500 03/09/21 0429  Weight: (!) 166.1 kg (!) 164.8 kg (!) 163.3 kg    General appearance: Awake alert.  In no distress Resp: Clear to auscultation bilaterally.  Normal effort Cardio: S1-S2 is normal regular.  No S3-S4.  No rubs murmurs or bruit GI: Abdomen is soft.  Nontender nondistended.  Bowel sounds are present normal.  No masses organomegaly Extremities: Continues to have 1-2+ pitting edema but seems to be improving. Neurologic: Alert and oriented x3.  No focal neurological deficits.     Lab Results:  Data Reviewed: I have personally reviewed following labs and imaging studies  CBC: Recent Labs  Lab 03/06/21 1808 03/07/21 0515 03/08/21 0519 03/09/21 0545  WBC 8.6 8.7 7.4  7.5  NEUTROABS  --  5.1  --   --   HGB 12.3 11.7* 12.0 12.3  HCT 39.1 36.7 37.7 39.1  MCV 94.0 93.6 93.8 95.4  PLT 286 250 253 097    Basic Metabolic Panel: Recent Labs  Lab 03/06/21 1808 03/07/21 0515 03/08/21 0519 03/09/21 0545  NA 140 139 141 141  K 4.0 3.6 3.6 3.5  CL 104 102 100 99  CO2 27 28 31 30   GLUCOSE 162* 133* 127* 113*  BUN 18 14 15 16   CREATININE 0.96 0.90 0.97 0.99  CALCIUM 8.8* 8.7* 8.7* 8.5*  MG  --  2.0  --   --     GFR: Estimated Creatinine Clearance: 91.4 mL/min (by C-G formula based on SCr of 0.99 mg/dL).  Liver Function Tests: Recent Labs  Lab 03/06/21 1808 03/07/21 0515  AST 19 18  ALT 19 20  ALKPHOS 73 58  BILITOT 0.7 1.0  PROT 7.2 6.8  ALBUMIN 3.9 3.8    Recent Labs  Lab 03/06/21 1808  LIPASE 36    HbA1C: Recent Labs    03/07/21 0515  HGBA1C 6.5*     Recent Results (from the past 240 hour(s))  Resp  Panel by RT-PCR (Flu A&B, Covid) Nasopharyngeal Swab     Status: None   Collection Time: 03/06/21  8:37 PM   Specimen: Nasopharyngeal Swab; Nasopharyngeal(NP) swabs in vial transport medium  Result Value Ref Range Status   SARS Coronavirus 2 by RT PCR NEGATIVE NEGATIVE Final    Comment: (NOTE) SARS-CoV-2 target nucleic acids are NOT DETECTED.  The SARS-CoV-2 RNA is generally detectable in upper respiratory specimens during the acute phase of infection. The lowest concentration of SARS-CoV-2 viral copies this assay can detect is 138 copies/mL. A negative result does not preclude SARS-Cov-2 infection and should not be used as the sole basis for treatment or other patient management decisions. A negative result may occur with  improper specimen collection/handling, submission of specimen other than nasopharyngeal swab, presence of viral mutation(s) within the areas targeted by this assay, and inadequate number of viral copies(<138 copies/mL). A negative result must be combined with clinical observations, patient history, and  epidemiological information. The expected result is Negative.  Fact Sheet for Patients:  EntrepreneurPulse.com.au  Fact Sheet for Healthcare Providers:  IncredibleEmployment.be  This test is no t yet approved or cleared by the [place] FDA and  has been authorized for detection and/or diagnosis of SARS-CoV-2 by FDA under an Emergency Use Authorization (EUA). This EUA will remain  in effect (meaning this test can be used) for the duration of the COVID-19 declaration under Section 564(b)(1) of the Act, 21 U.S.C.section 360bbb-3(b)(1), unless the authorization is terminated  or revoked sooner.       Influenza A by PCR NEGATIVE NEGATIVE Final   Influenza B by PCR NEGATIVE NEGATIVE Final    Comment: (NOTE) The Xpert Xpress SARS-CoV-2/FLU/RSV plus assay is intended as an aid in the diagnosis of influenza from Nasopharyngeal swab specimens and should not be used as a sole basis for treatment. Nasal washings and aspirates are unacceptable for Xpert Xpress SARS-CoV-2/FLU/RSV testing.  Fact Sheet for Patients: EntrepreneurPulse.com.au  Fact Sheet for Healthcare Providers: IncredibleEmployment.be  This test is not yet approved or cleared by the [place] FDA and has been authorized for detection and/or diagnosis of SARS-CoV-2 by FDA under an Emergency Use Authorization (EUA). This EUA will remain in effect (meaning this test can be used) for the duration of the COVID-19 declaration under Section 564(b)(1) of the Act, 21 U.S.C. section 360bbb-3(b)(1), unless the authorization is terminated or revoked.  Performed at [facility], [place] [address], [place], [place] [address]       Radiology Studies: ECHOCARDIOGRAM COMPLETE  Result Date: 03/08/2021    ECHOCARDIOGRAM REPORT   Patient Name:   Adryanna A Rylee Date of Exam: 03/08/2021 Medical Rec #:  [id number]       Height:       65.0 in  Accession #:    [id number]      Weight:       363.3 lb Date of Birth:  11-22-1956       BSA:          2.549 m Patient Age:    40 years        BP:           167/62 mmHg Patient Gender: F               HR:           78 bpm. Exam Location:  Inpatient Procedure: 2D Echo, Cardiac Doppler, Color Doppler and Intracardiac  Opacification Agent Indications:    I50.33 Acute on chronic diastolic (congestive) heart failure  History:        Patient has prior history of Echocardiogram examinations, most                 recent 01/31/2018. Risk Factors:Hypertension, Dyslipidemia and                 GERD.  Sonographer:    Jonelle Sidle Dance Referring Phys: [id number] Bennett  1. Left ventricular ejection fraction, by estimation, is 60 to 65%. The left ventricle has normal function. The left ventricle has no regional wall motion abnormalities. Left ventricular diastolic parameters are consistent with Grade I diastolic dysfunction (impaired relaxation). Elevated left ventricular end-diastolic pressure.  2. Right ventricular systolic function is normal. The right ventricular size is normal. Tricuspid regurgitation signal is inadequate for assessing PA pressure.  3. The mitral valve is normal in structure. No evidence of mitral valve regurgitation. No evidence of mitral stenosis.  4. The aortic valve is normal in structure. Aortic valve regurgitation is not visualized. No aortic stenosis is present.  5. The inferior vena cava is normal in size with greater than 50% respiratory variability, suggesting right atrial pressure of 3 mmHg. FINDINGS  Left Ventricle: Left ventricular ejection fraction, by estimation, is 60 to 65%. The left ventricle has normal function. The left ventricle has no regional wall motion abnormalities. Definity contrast agent was given IV to delineate the left ventricular  endocardial borders. The left ventricular internal cavity size was normal in size. There is no left ventricular hypertrophy.  Left ventricular diastolic parameters are consistent with Grade I diastolic dysfunction (impaired relaxation). Elevated left ventricular end-diastolic pressure. Right Ventricle: The right ventricular size is normal. No increase in right ventricular wall thickness. Right ventricular systolic function is normal. Tricuspid regurgitation signal is inadequate for assessing PA pressure. Left Atrium: Left atrial size was normal in size. Right Atrium: Right atrial size was normal in size. Pericardium: There is no evidence of pericardial effusion. Mitral Valve: The mitral valve is normal in structure. No evidence of mitral valve regurgitation. No evidence of mitral valve stenosis. Tricuspid Valve: The tricuspid valve is normal in structure. Tricuspid valve regurgitation is not demonstrated. No evidence of tricuspid stenosis. Aortic Valve: The aortic valve is normal in structure. Aortic valve regurgitation is not visualized. No aortic stenosis is present. Pulmonic Valve: The pulmonic valve was normal in structure. Pulmonic valve regurgitation is not visualized. No evidence of pulmonic stenosis. Aorta: The aortic root is normal in size and structure. Venous: The inferior vena cava is normal in size with greater than 50% respiratory variability, suggesting right atrial pressure of 3 mmHg. IAS/Shunts: No atrial level shunt detected by color flow Doppler.  LEFT VENTRICLE PLAX 2D LVIDd:         5.10 cm  Diastology LVIDs:         3.30 cm  LV e' medial:    5.22 cm/s LV PW:         1.40 cm  LV E/e' medial:  23.9 LV IVS:        1.00 cm  LV e' lateral:   5.44 cm/s LVOT diam:     2.00 cm  LV E/e' lateral: 23.0 LV SV:         85 LV SV Index:   34 LVOT Area:     3.14 cm  IVC IVC diam: 2.30 cm LEFT ATRIUM  Index LA diam:        4.60 cm 1.80 cm/m LA Vol (A2C):   62.4 ml 24.48 ml/m LA Vol (A4C):   55.6 ml 21.81 ml/m LA Biplane Vol: 59.0 ml 23.14 ml/m  AORTIC VALVE LVOT Vmax:   124.00 cm/s LVOT Vmean:  89.100 cm/s LVOT VTI:     0.272 m  AORTA Ao Root diam: 3.40 cm Ao Asc diam:  3.50 cm MITRAL VALVE MV Area (PHT): 2.99 cm     SHUNTS MV Decel Time: 254 msec     Systemic VTI:  0.27 m MV E velocity: 125.00 cm/s  Systemic Diam: 2.00 cm MV A velocity: 126.00 cm/s MV E/A ratio:  0.99 Fransico Him MD Electronically signed by Fransico Him MD Signature Date/Time: 03/08/2021/2:00:13 PM    Final        LOS: 2 days   [facility] Pager on [email or web address]  03/09/2021, 11:24 AM

## 2021-03-09 NOTE — Assessment & Plan Note (Signed)
.   BP Readings from Last 3 Encounters:  03/09/21 (!) 142/63  03/06/21 (!) 152/72  12/31/20 (!) 142/70   Stable, pt to continue medical treatment - losartan 100

## 2021-03-09 NOTE — Plan of Care (Signed)
Plan of care discussed with pt.

## 2021-03-09 NOTE — Assessment & Plan Note (Signed)
Lab Results  Component Value Date   HGBA1C 6.5 (H) 03/07/2021   Stable, pt to continue current medical treatment  - for diet, wt control

## 2021-03-10 DIAGNOSIS — I10 Essential (primary) hypertension: Secondary | ICD-10-CM | POA: Diagnosis not present

## 2021-03-10 DIAGNOSIS — G8929 Other chronic pain: Secondary | ICD-10-CM | POA: Diagnosis not present

## 2021-03-10 DIAGNOSIS — M545 Low back pain, unspecified: Secondary | ICD-10-CM | POA: Diagnosis not present

## 2021-03-10 DIAGNOSIS — I5033 Acute on chronic diastolic (congestive) heart failure: Secondary | ICD-10-CM | POA: Diagnosis not present

## 2021-03-10 LAB — BASIC METABOLIC PANEL
Anion gap: 11 (ref 5–15)
BUN: 21 mg/dL (ref 8–23)
CO2: 28 mmol/L (ref 22–32)
Calcium: 8.8 mg/dL — ABNORMAL LOW (ref 8.9–10.3)
Chloride: 98 mmol/L (ref 98–111)
Creatinine, Ser: 0.88 mg/dL (ref 0.44–1.00)
GFR, Estimated: >60 mL/min (ref >60)
Glucose, Bld: 119 mg/dL — ABNORMAL HIGH (ref 70–99)
Potassium: 3.7 mmol/L (ref 3.5–5.1)
Sodium: 137 mmol/L (ref 135–145)

## 2021-03-10 LAB — HIV-1 RNA QUANT-NO REFLEX-BLD
HIV 1 RNA Quant: <20 copies/mL
LOG10 HIV-1 RNA: UNDETERMINED log10copy/mL

## 2021-03-10 MED ORDER — POLYETHYLENE GLYCOL 3350 17 G PO PACK
17.0000 g | PACK | Freq: Every day | ORAL | Status: DC | PRN
  Administered 2021-03-10: 17 g via ORAL
  Filled 2021-03-10: qty 1

## 2021-03-10 MED ORDER — POTASSIUM CHLORIDE CRYS ER 20 MEQ PO TBCR
40.0000 meq | EXTENDED_RELEASE_TABLET | Freq: Two times a day (BID) | ORAL | Status: AC
  Administered 2021-03-10 (×2): 40 meq via ORAL
  Filled 2021-03-10 (×2): qty 2

## 2021-03-10 NOTE — Plan of Care (Signed)
  Problem: Health Behavior/Discharge Planning: Goal: Ability to manage health-related needs will improve Outcome: Progressing   Problem: Coping: Goal: Level of anxiety will decrease Outcome: Progressing   

## 2021-03-10 NOTE — Care Management Important Message (Signed)
Important Message  Patient Details IM Letter given to the Patient. Name: Maria [name] MRN: [id number] Date of Birth: 1956/08/05   Medicare Important Message Given:  Yes     Kerin Salen 03/10/2021, 12:39 PM

## 2021-03-10 NOTE — Progress Notes (Signed)
[facility] PROGRESS NOTE   Maria [name] WUJ:[id number] DOB: 05-30-1956 DOA: 03/06/2021  PCP: Biagio Borg, MD  Brief History/Interval Summary: 65 year old female with past medical history of diastolic congestive heart failure (echo 01/2018 EF 65-70%), restless leg syndrome, chronic pain syndrome secondary to chronic back pain, gastroesophageal reflux disease and hypertension who presented to [facility] hospital emergency department at the direction of her primary care provider due to progressively worsening weight gain and peripheral edema.  Patient had been taking furosemide at home despite which she experiences weight gain and worsening lower extremity edema.  She has been hospitalized for intravenous diuretics.  Consultants: None  Procedures:  Transthoracic echocardiogram 3/19 1. Left ventricular ejection fraction, by estimation, is 60 to 65%. The  left ventricle has normal function. The left ventricle has no regional  wall motion abnormalities. Left ventricular diastolic parameters are  consistent with Grade I diastolic  dysfunction (impaired relaxation). Elevated left ventricular end-diastolic  pressure.  2. Right ventricular systolic function is normal. The right ventricular  size is normal. Tricuspid regurgitation signal is inadequate for assessing  PA pressure.  3. The mitral valve is normal in structure. No evidence of mitral valve  regurgitation. No evidence of mitral stenosis.  4. The aortic valve is normal in structure. Aortic valve regurgitation is  not visualized. No aortic stenosis is present.  5. The inferior vena cava is normal in size with greater than 50%  respiratory variability, suggesting right atrial pressure of 3 mmHg.   Antibiotics: Anti-infectives (From admission, onward)   None      Subjective/Interval History: Patient remains anxious.  Feels that her legs and arms are more swollen today.  Denies any chest pain.  No shortness of  breath.       Assessment/Plan:  Acute on chronic diastolic CHF/anasarca Patient has been on 40 mg of furosemide at home despite which she has experienced significant weight gain and worsening lower extremity edema over the last few weeks prior to admission.   Echocardiogram shows normal systolic function.  Grade 1 diastolic dysfunction noted.   On IV diuretics.  She has been diuresing well.  Looks like she has diuresed about 11 L since this hospitalization.  Her weight is noted to be higher today for unclear reasons.  Continue IV Lasix for another day.  Reevaluate tomorrow.  Give potassium supplements. Continue to mobilize.  Essential hypertension Occasional high readings noted.  Continue Cozaar.    Chronic low back pain/chronic knee pain Patient noted to be on oxycodone 10 mg up to 4 times daily as needed for her pain issues at home.  This is being continued.  PT and OT evaluation.  Patient lives in an apartment.  Has been using a cane to ambulate over the last few days. Bowel regimen.  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  Discussed with patient.  She mentions that she has had a reactive HIV test previously.  Old labs were reviewed and indeed she was reactive many times previously but confirmatory tests have always been negative.  She was actually seen by infectious disease specialist Dr. Johnnye Sima in 2013-2014 and it was felt that she had some kind of a cross-reactivity with the HIV antibody resulting in false positive test.  Confirmatory test results from this visit are still pending but the patient was reassured.    Hyperglycemia Stable.  HbA1c 6.5.  Consider Metformin at discharge.   Outpatient follow-up with PCP.  History of GERD without  esophagitis Continue PPI  Restless leg syndrome Continue ropinirole.  Class III obesity Estimated body mass index is 60.53 kg/m as calculated from the following:   Height as of this encounter: 5\' 5"  (1.651  m).   Weight as of this encounter: 165 kg.   DVT Prophylaxis: On Lovenox.  Code Status: Full code Family Communication: Discussed with the patient Disposition Plan: Hopefully return home in 24 to 48 hours.  Status is: Inpatient  Remains inpatient appropriate because:IV treatments appropriate due to intensity of illness or inability to take PO   Dispo:  Patient From: Home  Planned Disposition: Home  Medically stable for discharge: No       Medications:  Scheduled: . enoxaparin (LOVENOX) injection  80 mg Subcutaneous Q24H  . furosemide  40 mg Intravenous BID  . losartan  100 mg Oral Daily  . pantoprazole  40 mg Oral Daily  . potassium chloride  40 mEq Oral BID  . rOPINIRole  3 mg Oral QHS  . sodium chloride flush  3 mL Intravenous Q12H   Continuous: . sodium chloride     KGM:WNUUVO chloride, acetaminophen, albuterol, clonazePAM, cyclobenzaprine, linaclotide, ondansetron (ZOFRAN) IV, ondansetron, oxyCODONE, sodium chloride flush, tetrahydrozoline   Objective:  Vital Signs  Vitals:   03/09/21 1429 03/09/21 2141 03/10/21 0500 03/10/21 0510  BP: (!) 139/56 130/62  139/61  Pulse: 77 79  76  Resp:  20  20  Temp:  98 F (36.7 C)  98.6 F (37 C)  TempSrc:  Oral    SpO2:  95%  97%  Weight:   (!) 165 kg   Height:        Intake/Output Summary (Last 24 hours) at 03/10/2021 1014 Last data filed at 03/10/2021 0800 Gross per 24 hour  Intake 63 ml  Output 2350 ml  Net -2287 ml   Filed Weights   03/08/21 0500 03/09/21 0429 03/10/21 0500  Weight: (!) 164.8 kg (!) 163.3 kg (!) 165 kg    General appearance: Awake alert.  In no distress Resp: Clear to auscultation bilaterally.  Normal effort Cardio: S1-S2 is normal regular.  No S3-S4.  No rubs murmurs or bruit GI: Abdomen is soft.  Nontender nondistended.  Bowel sounds are present normal.  No masses organomegaly Extremities: Improving edema bilateral lower extremities Neurologic: Alert and oriented x3.  No focal  neurological deficits.     Lab Results:  Data Reviewed: I have personally reviewed following labs and imaging studies  CBC: Recent Labs  Lab 03/06/21 1808 03/07/21 0515 03/08/21 0519 03/09/21 0545  WBC 8.6 8.7 7.4 7.5  NEUTROABS  --  5.1  --   --   HGB 12.3 11.7* 12.0 12.3  HCT 39.1 36.7 37.7 39.1  MCV 94.0 93.6 93.8 95.4  PLT 286 250 253 536    Basic Metabolic Panel: Recent Labs  Lab 03/06/21 1808 03/07/21 0515 03/08/21 0519 03/09/21 0545 03/10/21 0524  NA 140 139 141 141 137  K 4.0 3.6 3.6 3.5 3.7  CL 104 102 100 99 98  CO2 27 28 31 30 28   GLUCOSE 162* 133* 127* 113* 119*  BUN 18 14 15 16 21   CREATININE 0.96 0.90 0.97 0.99 0.88  CALCIUM 8.8* 8.7* 8.7* 8.5* 8.8*  MG  --  2.0  --   --   --     GFR: Estimated Creatinine Clearance: 103.5 mL/min (by C-G formula based on SCr of 0.88 mg/dL).  Liver Function Tests: Recent Labs  Lab 03/06/21 1808 03/07/21 0515  AST 19 18  ALT 19 20  ALKPHOS 73 58  BILITOT 0.7 1.0  PROT 7.2 6.8  ALBUMIN 3.9 3.8    Recent Labs  Lab 03/06/21 1808  LIPASE 36    HbA1C: No results for input(s): HGBA1C in the last 72 hours.   Recent Results (from the past 240 hour(s))  Resp Panel by RT-PCR (Flu A&B, Covid) Nasopharyngeal Swab     Status: None   Collection Time: 03/06/21  8:37 PM   Specimen: Nasopharyngeal Swab; Nasopharyngeal(NP) swabs in vial transport medium  Result Value Ref Range Status   SARS Coronavirus 2 by RT PCR NEGATIVE NEGATIVE Final    Comment: (NOTE) SARS-CoV-2 target nucleic acids are NOT DETECTED.  The SARS-CoV-2 RNA is generally detectable in upper respiratory specimens during the acute phase of infection. The lowest concentration of SARS-CoV-2 viral copies this assay can detect is 138 copies/mL. A negative result does not preclude SARS-Cov-2 infection and should not be used as the sole basis for treatment or other patient management decisions. A negative result may occur with  improper specimen  collection/handling, submission of specimen other than nasopharyngeal swab, presence of viral mutation(s) within the areas targeted by this assay, and inadequate number of viral copies(<138 copies/mL). A negative result must be combined with clinical observations, patient history, and epidemiological information. The expected result is Negative.  Fact Sheet for Patients:  EntrepreneurPulse.com.au  Fact Sheet for Healthcare Providers:  IncredibleEmployment.be  This test is no t yet approved or cleared by the [place] FDA and  has been authorized for detection and/or diagnosis of SARS-CoV-2 by FDA under an Emergency Use Authorization (EUA). This EUA will remain  in effect (meaning this test can be used) for the duration of the COVID-19 declaration under Section 564(b)(1) of the Act, 21 U.S.C.section 360bbb-3(b)(1), unless the authorization is terminated  or revoked sooner.       Influenza A by PCR NEGATIVE NEGATIVE Final   Influenza B by PCR NEGATIVE NEGATIVE Final    Comment: (NOTE) The Xpert Xpress SARS-CoV-2/FLU/RSV plus assay is intended as an aid in the diagnosis of influenza from Nasopharyngeal swab specimens and should not be used as a sole basis for treatment. Nasal washings and aspirates are unacceptable for Xpert Xpress SARS-CoV-2/FLU/RSV testing.  Fact Sheet for Patients: EntrepreneurPulse.com.au  Fact Sheet for Healthcare Providers: IncredibleEmployment.be  This test is not yet approved or cleared by the [place] FDA and has been authorized for detection and/or diagnosis of SARS-CoV-2 by FDA under an Emergency Use Authorization (EUA). This EUA will remain in effect (meaning this test can be used) for the duration of the COVID-19 declaration under Section 564(b)(1) of the Act, 21 U.S.C. section 360bbb-3(b)(1), unless the authorization is terminated or revoked.  Performed at [facility], [place] [address], [place], [place] [address]       Radiology Studies: ECHOCARDIOGRAM COMPLETE  Result Date: 03/08/2021    ECHOCARDIOGRAM REPORT   Patient Name:   Maria [name] Date of Exam: 03/08/2021 Medical Rec #:  [id number]       Height:       65.0 in Accession #:    [id number]      Weight:       363.3 lb Date of Birth:  1956-01-07       BSA:          2.549 m Patient Age:    35 years        BP:  167/62 mmHg Patient Gender: F               HR:           78 bpm. Exam Location:  Inpatient Procedure: 2D Echo, Cardiac Doppler, Color Doppler and Intracardiac            Opacification Agent Indications:    I50.33 Acute on chronic diastolic (congestive) heart failure  History:        Patient has prior history of Echocardiogram examinations, most                 recent 01/31/2018. Risk Factors:Hypertension, Dyslipidemia and                 GERD.  Sonographer:    Jonelle Sidle Dance Referring Phys: [id number] [place]  1. Left ventricular ejection fraction, by estimation, is 60 to 65%. The left ventricle has normal function. The left ventricle has no regional wall motion abnormalities. Left ventricular diastolic parameters are consistent with Grade I diastolic dysfunction (impaired relaxation). Elevated left ventricular end-diastolic pressure.  2. Right ventricular systolic function is normal. The right ventricular size is normal. Tricuspid regurgitation signal is inadequate for assessing PA pressure.  3. The mitral valve is normal in structure. No evidence of mitral valve regurgitation. No evidence of mitral stenosis.  4. The aortic valve is normal in structure. Aortic valve regurgitation is not visualized. No aortic stenosis is present.  5. The inferior vena cava is normal in size with greater than 50% respiratory variability, suggesting right atrial pressure of 3 mmHg. FINDINGS  Left Ventricle: Left ventricular ejection fraction, by estimation, is 60 to 65%. The left  ventricle has normal function. The left ventricle has no regional wall motion abnormalities. Definity contrast agent was given IV to delineate the left ventricular  endocardial borders. The left ventricular internal cavity size was normal in size. There is no left ventricular hypertrophy. Left ventricular diastolic parameters are consistent with Grade I diastolic dysfunction (impaired relaxation). Elevated left ventricular end-diastolic pressure. Right Ventricle: The right ventricular size is normal. No increase in right ventricular wall thickness. Right ventricular systolic function is normal. Tricuspid regurgitation signal is inadequate for assessing PA pressure. Left Atrium: Left atrial size was normal in size. Right Atrium: Right atrial size was normal in size. Pericardium: There is no evidence of pericardial effusion. Mitral Valve: The mitral valve is normal in structure. No evidence of mitral valve regurgitation. No evidence of mitral valve stenosis. Tricuspid Valve: The tricuspid valve is normal in structure. Tricuspid valve regurgitation is not demonstrated. No evidence of tricuspid stenosis. Aortic Valve: The aortic valve is normal in structure. Aortic valve regurgitation is not visualized. No aortic stenosis is present. Pulmonic Valve: The pulmonic valve was normal in structure. Pulmonic valve regurgitation is not visualized. No evidence of pulmonic stenosis. Aorta: The aortic root is normal in size and structure. Venous: The inferior vena cava is normal in size with greater than 50% respiratory variability, suggesting right atrial pressure of 3 mmHg. IAS/Shunts: No atrial level shunt detected by color flow Doppler.  LEFT VENTRICLE PLAX 2D LVIDd:         5.10 cm  Diastology LVIDs:         3.30 cm  LV e' medial:    5.22 cm/s LV PW:         1.40 cm  LV E/e' medial:  23.9 LV IVS:        1.00 cm  LV e' lateral:  5.44 cm/s LVOT diam:     2.00 cm  LV E/e' lateral: 23.0 LV SV:         85 LV SV Index:   34 LVOT  Area:     3.14 cm  IVC IVC diam: 2.30 cm LEFT ATRIUM             Index LA diam:        4.60 cm 1.80 cm/m LA Vol (A2C):   62.4 ml 24.48 ml/m LA Vol (A4C):   55.6 ml 21.81 ml/m LA Biplane Vol: 59.0 ml 23.14 ml/m  AORTIC VALVE LVOT Vmax:   124.00 cm/s LVOT Vmean:  89.100 cm/s LVOT VTI:    0.272 m  AORTA Ao Root diam: 3.40 cm Ao Asc diam:  3.50 cm MITRAL VALVE MV Area (PHT): 2.99 cm     SHUNTS MV Decel Time: 254 msec     Systemic VTI:  0.27 m MV E velocity: 125.00 cm/s  Systemic Diam: 2.00 cm MV A velocity: 126.00 cm/s MV E/A ratio:  0.99 Fransico Him MD Electronically signed by Fransico Him MD Signature Date/Time: 03/08/2021/2:00:13 PM    Final        LOS: 3 days   [facility] Pager on [email or web address]  03/10/2021, 10:14 AM

## 2021-03-10 NOTE — Progress Notes (Signed)
PT Cancellation Note  Patient Details Name: Maria [name] MRN: [id number] DOB: [birth year]   Cancelled Treatment:    Reason Eval/Treat Not Completed: Attempted PT tx session-pt declined to participate on today. Will check back another day.   [facility]  Office: [phone] Pager: [phone]

## 2021-03-11 DIAGNOSIS — N1831 Chronic kidney disease, stage 3a: Secondary | ICD-10-CM | POA: Diagnosis present

## 2021-03-11 DIAGNOSIS — E1169 Type 2 diabetes mellitus with other specified complication: Secondary | ICD-10-CM | POA: Diagnosis present

## 2021-03-11 DIAGNOSIS — E119 Type 2 diabetes mellitus without complications: Secondary | ICD-10-CM | POA: Diagnosis present

## 2021-03-11 DIAGNOSIS — I5033 Acute on chronic diastolic (congestive) heart failure: Secondary | ICD-10-CM | POA: Diagnosis not present

## 2021-03-11 LAB — BASIC METABOLIC PANEL
Anion gap: 10 (ref 5–15)
BUN: 22 mg/dL (ref 8–23)
CO2: 28 mmol/L (ref 22–32)
Calcium: 9.4 mg/dL (ref 8.9–10.3)
Chloride: 101 mmol/L (ref 98–111)
Creatinine, Ser: 1.01 mg/dL — ABNORMAL HIGH (ref 0.44–1.00)
GFR, Estimated: >60 mL/min (ref >60)
Glucose, Bld: 126 mg/dL — ABNORMAL HIGH (ref 70–99)
Potassium: 3.8 mmol/L (ref 3.5–5.1)
Sodium: 139 mmol/L (ref 135–145)

## 2021-03-11 MED ORDER — FUROSEMIDE 40 MG PO TABS
60.0000 mg | ORAL_TABLET | Freq: Every day | ORAL | Status: DC
  Administered 2021-03-11: 60 mg via ORAL
  Filled 2021-03-11: qty 1

## 2021-03-11 MED ORDER — METFORMIN HCL 500 MG PO TABS: 500.0000 mg | ORAL_TABLET | Freq: Two times a day (BID) | ORAL | 1 refills | Status: DC

## 2021-03-11 MED ORDER — POTASSIUM CHLORIDE ER 10 MEQ PO CPCR: 20.0000 meq | ORAL_CAPSULE | Freq: Every day | ORAL | 1 refills | Status: DC

## 2021-03-11 MED ORDER — FUROSEMIDE 20 MG PO TABS: 60.0000 mg | ORAL_TABLET | Freq: Two times a day (BID) | ORAL | 1 refills | Status: DC

## 2021-03-11 NOTE — Discharge Instructions (Signed)
Heart Failure, Diagnosis  Heart failure means that your heart is not able to pump blood in the right way. This makes it hard for your body to work well. Heart failure is usually a long-term (chronic) condition. You must take good care of yourself and follow your treatment plan from your doctor. What are the causes?  High blood pressure.  Buildup of cholesterol and fat in the arteries.  Heart attack. This injures the heart muscle.  Heart valves that do not open and close properly.  Damage of the heart muscle. This is also called cardiomyopathy.  Infection of the heart muscle. This is also called myocarditis.  Lung disease. What increases the risk?  Getting older. The risk of heart failure goes up as a person ages.  Being overweight.  Being female.  Use tobacco or nicotine products.  Abusing alcohol or drugs.  Having taken medicines that can damage the heart.  Having any of these conditions: ? Diabetes. ? Abnormal heart rhythms. ? Thyroid problems. ? Low blood counts (anemia).  Having a family history of heart failure. What are the signs or symptoms?  Shortness of breath.  Coughing.  Swelling of the feet, ankles, legs, or belly.  Losing or gaining weight for no reason.  Trouble breathing.  Waking from sleep because of the need to sit up and get more air.  Fast heartbeat.  Being very tired.  Feeling dizzy, or feeling like you may pass out (faint).  Having no desire to eat.  Feeling like you may vomit (nauseous).  Peeing (urinating) more at night.  Feeling confused. How is this treated? This condition may be treated with:  Medicines. These can be given to treat blood pressure and to make the heart muscles stronger.  Changes in your daily life. These may include: ? Eating a healthy diet. ? Staying at a healthy body weight. ? Quitting tobacco, alcohol, and drug use. ? Doing exercises. ? Participating in a cardiac rehabilitation program. This program  helps you improve your health through exercise, education, and counseling.  Surgery. Surgery can be done to open blocked valves, or to put devices in the heart, such as pacemakers.  A donor heart (heart transplant). You will receive a healthy heart from a donor. Follow these instructions at home:  Treat other conditions as told by your doctor. These may include high blood pressure, diabetes, thyroid disease, or abnormal heart rhythms.  Learn as much as you can about heart failure.  Get support as you need it.  Keep all follow-up visits. Summary  Heart failure means that your heart is not able to pump blood in the right way.  This condition is often caused by high blood pressure, heart attack, or damage of the heart muscle.  Symptoms of this condition include shortness of breath and swelling of the feet, ankles, legs, or belly. You may also feel very tired or feel like you may vomit.  You may be treated with medicines, surgery, or changes in your daily life.  Treat other health conditions as told by your doctor. This information is not intended to replace advice given to you by your health care provider. Make sure you discuss any questions you have with your health care provider. Document Revised: 06/29/2020 Document Reviewed: 06/29/2020 Elsevier Patient Education  2021 Elsevier Inc.  

## 2021-03-11 NOTE — Progress Notes (Signed)
Discharge instructions completed with patient including discussing new medication Metformin along with signs and symptoms of hypoglycemia. Encouraged pt to educate herself by reading about this new medication and discussing with her PCP. Pt verbalized understanding, denies further questions or concerns at this time. Her ride will be here around 1pm.  Coolidge Breeze, RN 03/11/2021

## 2021-03-11 NOTE — Progress Notes (Signed)
Pt IV removed. Medications retrieved from pharmacy and returned to the patient. Pt taken to the main entrance via wheelchair.

## 2021-03-11 NOTE — Discharge Summary (Signed)
[facility]  Physician Discharge Summary   Patient ID: Maria [name] MRN: [id number] DOB/AGE: April 04, 1956 65 y.o.  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 Outpatient follow-up with PCP for further management of diabetes 2. Needs basic metabolic panel to be checked next week at follow-up    Home Health: RN and PT Equipment/Devices: None  CODE STATUS: Full code  DISCHARGE CONDITION: fair  Diet recommendation: Modified carbohydrate  INITIAL HISTORY: 65 year old female with past medical history of diastolic congestive heart failure(echo 01/2018 EF 65-70%),restless leg syndrome, chronic pain syndrome secondary to chronic back pain, gastroesophageal reflux disease and hypertension who presented to [facility] hospital emergency department at the direction of her primary care provider due to progressively worsening weight gain and peripheral edema.  Patient had been taking furosemide at home despite which she experiences weight gain and worsening lower extremity edema.  She has been hospitalized for intravenous diuretics.  Consultants: None  Procedures:  Transthoracic echocardiogram 3/19 1. Left ventricular ejection fraction, by estimation, is 60 to 65%. The  left ventricle has normal function. The left ventricle has no regional  wall motion abnormalities. Left ventricular diastolic parameters are  consistent with Grade I diastolic  dysfunction (impaired relaxation). Elevated left ventricular end-diastolic  pressure.  2. Right ventricular systolic function is normal. The right ventricular  size is normal. Tricuspid regurgitation signal is inadequate for assessing  PA pressure.   3. The mitral valve is normal in structure. No evidence of mitral valve  regurgitation. No evidence of mitral stenosis.  4. The aortic valve is normal in structure. Aortic valve regurgitation is  not visualized. No aortic stenosis is present.  5. The inferior vena cava is normal in size with greater than 50%  respiratory variability, suggesting right atrial pressure of 3 mmHg.     HOSPITAL COURSE:    Acute on chronic diastolic CHF/anasarca Patient has been on 40 mg of furosemide at home despite which she has experienced significant weight gain and worsening lower extremity edema over the last few weeks prior to admission.   Echocardiogram shows normal systolic function.  Grade 1 diastolic dysfunction noted.   Patient was started on IV diuretics.  She has diuresed well.  She has lost weight during this hospital stay.  She has lost about 4 kg.  Feels better.  Her home dose of furosemide will be increased to 60 mg twice a day for now.  Outpatient follow-up with PCP next week with blood work.  Essential hypertension Home medications being continued  Chronic low back pain/chronic knee pain Patient noted to be on oxycodone 10 mg up to 4 times daily as needed for her pain issues at home.    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  Discussed with patient.  She mentions that she has had a reactive HIV test previously.  Old labs were reviewed and indeed she was reactive many times previously but confirmatory tests have always been negative.  She was actually seen by infectious disease specialist Dr. Johnnye Sima in 2013-2014 and it was felt that she had some kind of a cross-reactivity with the HIV antibody resulting in false positive test.   HIV RNA quantitative is undetectable. Patient reassured and asked to follow-up with  PCP.  Diabetes mellitus type 2 in obese, newly diagnosed HbA1c 6.5.  Metformin will be prescribed at discharge. Outpatient follow-up with  PCP.  History of GERD without esophagitis Continue PPI  Restless leg syndrome Continue ropinirole.  Class III obesity Estimated body mass index is 60.53 kg/m as calculated from the following:   Height as of this encounter: '5\' 5"'  (1.651 m).   Weight as of this encounter: 165 kg.  Patient has improved.  Okay for discharge home today.  Home health will be ordered.    PERTINENT LABS:  The results of significant diagnostics from this hospitalization (including imaging, microbiology, ancillary and laboratory) are listed below for reference.    Microbiology: Recent Results (from the past 240 hour(s))  Resp Panel by RT-PCR (Flu A&B, Covid) Nasopharyngeal Swab     Status: None   Collection Time: 03/06/21  8:37 PM   Specimen: Nasopharyngeal Swab; Nasopharyngeal(NP) swabs in vial transport medium  Result Value Ref Range Status   SARS Coronavirus 2 by RT PCR NEGATIVE NEGATIVE Final    Comment: (NOTE) SARS-CoV-2 target nucleic acids are NOT DETECTED.  The SARS-CoV-2 RNA is generally detectable in upper respiratory specimens during the acute phase of infection. The lowest concentration of SARS-CoV-2 viral copies this assay can detect is 138 copies/mL. A negative result does not preclude SARS-Cov-2 infection and should not be used as the sole basis for treatment or other patient management decisions. A negative result may occur with  improper specimen collection/handling, submission of specimen other than nasopharyngeal swab, presence of viral mutation(s) within the areas targeted by this assay, and inadequate number of viral copies(<138 copies/mL). A negative result must be combined with clinical observations, patient history, and epidemiological information. The expected result is Negative.  Fact Sheet for Patients:  EntrepreneurPulse.com.au  Fact Sheet for Healthcare Providers:  IncredibleEmployment.be  This test is no t yet approved or  cleared by the [place] FDA and  has been authorized for detection and/or diagnosis of SARS-CoV-2 by FDA under an Emergency Use Authorization (EUA). This EUA will remain  in effect (meaning this test can be used) for the duration of the COVID-19 declaration under Section 564(b)(1) of the Act, 21 U.S.C.section 360bbb-3(b)(1), unless the authorization is terminated  or revoked sooner.       Influenza A by PCR NEGATIVE NEGATIVE Final   Influenza B by PCR NEGATIVE NEGATIVE Final    Comment: (NOTE) The Xpert Xpress SARS-CoV-2/FLU/RSV plus assay is intended as an aid in the diagnosis of influenza from Nasopharyngeal swab specimens and should not be used as a sole basis for treatment. Nasal washings and aspirates are unacceptable for Xpert Xpress SARS-CoV-2/FLU/RSV testing.  Fact Sheet for Patients: EntrepreneurPulse.com.au  Fact Sheet for Healthcare Providers: IncredibleEmployment.be  This test is not yet approved or cleared by the [place] FDA and has been authorized for detection and/or diagnosis of SARS-CoV-2 by FDA under an Emergency Use Authorization (EUA). This EUA will remain in effect (meaning this test can be used) for the duration of the COVID-19 declaration under Section 564(b)(1) of the Act, 21 U.S.C. section 360bbb-3(b)(1), unless the authorization is terminated or revoked.  Performed at [facility], [place] [address] [place], [place] [address]      Labs:  COVID-19 Labs   Lab Results  Component Value Date   [place] NEGATIVE 03/06/2021   [place] NOT DETECTED 09/22/2019      Basic Metabolic Panel: Recent Labs  Lab 03/07/21 0515 03/08/21 0519 03/09/21 0545 03/10/21 0524 03/11/21  0557  NA 139 141 141 137 139  K 3.6 3.6 3.5 3.7 3.8  CL 102 100 99 98 101  CO2 '28 31 30 28 28  ' GLUCOSE 133* 127* 113* 119* 126*  BUN '14 15 16 21 22  ' CREATININE 0.90 0.97 0.99 0.88 1.01*  CALCIUM 8.7*  8.7* 8.5* 8.8* 9.4  MG 2.0  --   --   --   --    Liver Function Tests: Recent Labs  Lab 03/06/21 1808 03/07/21 0515  AST 19 18  ALT 19 20  ALKPHOS 73 58  BILITOT 0.7 1.0  PROT 7.2 6.8  ALBUMIN 3.9 3.8   Recent Labs  Lab 03/06/21 1808  LIPASE 36   CBC: Recent Labs  Lab 03/06/21 1808 03/07/21 0515 03/08/21 0519 03/09/21 0545  WBC 8.6 8.7 7.4 7.5  NEUTROABS  --  5.1  --   --   HGB 12.3 11.7* 12.0 12.3  HCT 39.1 36.7 37.7 39.1  MCV 94.0 93.6 93.8 95.4  PLT 286 250 253 261   BNP: BNP (last 3 results) Recent Labs    09/10/20 1255 03/06/21 1808  BNP 19 12.6      IMAGING STUDIES DG Chest 1 View  Result Date: 03/06/2021 CLINICAL DATA:  Bilateral leg swelling EXAM: CHEST  1 VIEW COMPARISON:  12/31/2018 FINDINGS: Borderline to mild cardiomegaly with slight central congestion. No focal consolidation, pleural effusion or pneumothorax. Mild aortic atherosclerosis. Surgical hardware in the cervical spine IMPRESSION: Borderline to mild cardiomegaly with slight central congestion. Electronically Signed   By: Donavan Foil M.D.   On: 03/06/2021 22:12   ECHOCARDIOGRAM COMPLETE  Result Date: 03/08/2021    ECHOCARDIOGRAM REPORT   Patient Name:   Dalton A Mak Date of Exam: 03/08/2021 Medical Rec #:  [id number]       Height:       65.0 in Accession #:    [id number]      Weight:       363.3 lb Date of Birth:  1956/06/20       BSA:          2.549 m Patient Age:    65 years        BP:           167/62 mmHg Patient Gender: F               HR:           78 bpm. Exam Location:  Inpatient Procedure: 2D Echo, Cardiac Doppler, Color Doppler and Intracardiac            Opacification Agent Indications:    I50.33 Acute on chronic diastolic (congestive) heart failure  History:        Patient has prior history of Echocardiogram examinations, most                 recent 01/31/2018. Risk Factors:Hypertension, Dyslipidemia and                 GERD.  Sonographer:    Jonelle Sidle Dance Referring Phys: [id number]  [place]  1. Left ventricular ejection fraction, by estimation, is 60 to 65%. The left ventricle has normal function. The left ventricle has no regional wall motion abnormalities. Left ventricular diastolic parameters are consistent with Grade I diastolic dysfunction (impaired relaxation). Elevated left ventricular end-diastolic pressure.  2. Right ventricular systolic function is normal. The right ventricular size is normal. Tricuspid regurgitation signal is inadequate for assessing PA pressure.  3.  The mitral valve is normal in structure. No evidence of mitral valve regurgitation. No evidence of mitral stenosis.  4. The aortic valve is normal in structure. Aortic valve regurgitation is not visualized. No aortic stenosis is present.  5. The inferior vena cava is normal in size with greater than 50% respiratory variability, suggesting right atrial pressure of 3 mmHg. FINDINGS  Left Ventricle: Left ventricular ejection fraction, by estimation, is 60 to 65%. The left ventricle has normal function. The left ventricle has no regional wall motion abnormalities. Definity contrast agent was given IV to delineate the left ventricular  endocardial borders. The left ventricular internal cavity size was normal in size. There is no left ventricular hypertrophy. Left ventricular diastolic parameters are consistent with Grade I diastolic dysfunction (impaired relaxation). Elevated left ventricular end-diastolic pressure. Right Ventricle: The right ventricular size is normal. No increase in right ventricular wall thickness. Right ventricular systolic function is normal. Tricuspid regurgitation signal is inadequate for assessing PA pressure. Left Atrium: Left atrial size was normal in size. Right Atrium: Right atrial size was normal in size. Pericardium: There is no evidence of pericardial effusion. Mitral Valve: The mitral valve is normal in structure. No evidence of mitral valve regurgitation. No evidence of  mitral valve stenosis. Tricuspid Valve: The tricuspid valve is normal in structure. Tricuspid valve regurgitation is not demonstrated. No evidence of tricuspid stenosis. Aortic Valve: The aortic valve is normal in structure. Aortic valve regurgitation is not visualized. No aortic stenosis is present. Pulmonic Valve: The pulmonic valve was normal in structure. Pulmonic valve regurgitation is not visualized. No evidence of pulmonic stenosis. Aorta: The aortic root is normal in size and structure. Venous: The inferior vena cava is normal in size with greater than 50% respiratory variability, suggesting right atrial pressure of 3 mmHg. IAS/Shunts: No atrial level shunt detected by color flow Doppler.  LEFT VENTRICLE PLAX 2D LVIDd:         5.10 cm  Diastology LVIDs:         3.30 cm  LV e' medial:    5.22 cm/s LV PW:         1.40 cm  LV E/e' medial:  23.9 LV IVS:        1.00 cm  LV e' lateral:   5.44 cm/s LVOT diam:     2.00 cm  LV E/e' lateral: 23.0 LV SV:         85 LV SV Index:   34 LVOT Area:     3.14 cm  IVC IVC diam: 2.30 cm LEFT ATRIUM             Index LA diam:        4.60 cm 1.80 cm/m LA Vol (A2C):   62.4 ml 24.48 ml/m LA Vol (A4C):   55.6 ml 21.81 ml/m LA Biplane Vol: 59.0 ml 23.14 ml/m  AORTIC VALVE LVOT Vmax:   124.00 cm/s LVOT Vmean:  89.100 cm/s LVOT VTI:    0.272 m  AORTA Ao Root diam: 3.40 cm Ao Asc diam:  3.50 cm MITRAL VALVE MV Area (PHT): 2.99 cm     SHUNTS MV Decel Time: 254 msec     Systemic VTI:  0.27 m MV E velocity: 125.00 cm/s  Systemic Diam: 2.00 cm MV A velocity: 126.00 cm/s MV E/A ratio:  0.99 Fransico Him MD Electronically signed by Fransico Him MD Signature Date/Time: 03/08/2021/2:00:13 PM    Final     DISCHARGE EXAMINATION: Vitals:   03/10/21 1215 03/10/21 1957  03/11/21 0500 03/11/21 0555  BP: (!) 143/64 124/62  (!) 147/67  Pulse: 67 83  88  Resp:  19  18  Temp: (!) 97.5 F (36.4 C) 97.8 F (36.6 C)  98.5 F (36.9 C)  TempSrc: Oral Oral  Oral  SpO2: 97% 97%  97%  Weight:    (!) 162.2 kg   Height:       General appearance: Awake alert.  In no distress Resp: Clear to auscultation bilaterally.  Normal effort Cardio: S1-S2 is normal regular.  No S3-S4.  No rubs murmurs or bruit GI: Abdomen is soft.  Nontender nondistended.  Bowel sounds are present normal.  No masses organomegaly Improved edema bilateral lower extremities    DISPOSITION: Home  Discharge Instructions    (HEART FAILURE PATIENTS) Call MD:  Anytime you have any of the following symptoms: 1) 3 pound weight gain in 24 hours or 5 pounds in 1 week 2) shortness of breath, with or without a dry hacking cough 3) swelling in the hands, feet or stomach 4) if you have to sleep on extra pillows at night in order to breathe.   Complete by: As directed    Call MD for:  difficulty breathing, headache or visual disturbances   Complete by: As directed    Call MD for:  extreme fatigue   Complete by: As directed    Call MD for:  persistant dizziness or light-headedness   Complete by: As directed    Call MD for:  persistant nausea and vomiting   Complete by: As directed    Call MD for:  severe uncontrolled pain   Complete by: As directed    Call MD for:  temperature >100.4   Complete by: As directed    Diet - low sodium heart healthy   Complete by: As directed    Diet Carb Modified   Complete by: As directed    Discharge instructions   Complete by: As directed    Please be sure to follow-up with your primary care provider in 1 week.  Take your medications as prescribed.  You will need to undergo blood work to check your potassium level and your kidney function and follow-up. Your glucose levels were noted to be elevated in the hospital stay. HbA1c was 6.5 meeting criteria for diabetes.  You will be sent home with a prescription for Metformin.  You can discuss this further with your PCP.  You were cared for by a hospitalist during your hospital stay. If you have any questions about your discharge medications  or the care you received while you were in the hospital after you are discharged, you can call the unit and asked to speak with the hospitalist on call if the hospitalist that took care of you is not available. Once you are discharged, your primary care physician will handle any further medical issues. Please note that NO REFILLS for any discharge medications will be authorized once you are discharged, as it is imperative that you return to your primary care physician (or establish a relationship with a primary care physician if you do not have one) for your aftercare needs so that they can reassess your need for medications and monitor your lab values. If you do not have a primary care physician, you can call [phone] for a physician referral.   Increase activity slowly   Complete by: As directed    No wound care   Complete by: As directed  Allergies as of 03/11/2021      Reactions   Amoxicillin-pot Clavulanate Nausea And Vomiting   Projectile vomiting Did it involve swelling of the face/tongue/throat, SOB, or low BP? No Did it involve sudden or severe rash/hives, skin peeling, or any reaction on the inside of your mouth or nose? No Did you need to seek medical attention at a hospital or doctor's office? No When did it last happen?20-30 years If all above answers are "NO", may proceed with cephalosporin use.   Adhesive [tape] Other (See Comments)   Tears skin off - use paper tape    Codeine Other (See Comments)   hallucinations   Crestor [rosuvastatin Calcium]    Severe muscle cramps   Lactose Intolerance (gi) Nausea And Vomiting   Morphine And Related Nausea And Vomiting, Other (See Comments)   Migraine headaches   Vicodin [hydrocodone-acetaminophen] Other (See Comments)   hallucinations   Latex Rash      Medication List    TAKE these medications   acetaminophen 325 MG tablet Commonly known as: TYLENOL Take 650 mg by mouth every 6 (six) hours as needed for headache  (pain).   albuterol 108 (90 Base) MCG/ACT inhaler Commonly known as: ProAir HFA Inhale 2 puffs into the lungs every 6 (six) hours as needed for wheezing or shortness of breath.   clonazePAM 1 MG tablet Commonly known as: KLONOPIN TAKE 1 TABLET BY MOUTH 2 TIMES A DAY AS NEEDED FOR ANXIETY What changed: See the new instructions.   cyclobenzaprine 10 MG tablet Commonly known as: FLEXERIL TAKE 1 TABLET BY MOUTH 2 TIMES A DAY AS NEEDED FOR MUSCLE SPASMS What changed: See the new instructions.   furosemide 20 MG tablet Commonly known as: LASIX Take 3 tablets (60 mg total) by mouth 2 (two) times daily. What changed:   medication strength  how much to take  when to take this   Linzess 145 MCG Caps capsule Generic drug: linaclotide Take 145 mcg by mouth daily as needed (constipation).   losartan 100 MG tablet Commonly known as: COZAAR TAKE 1 TABLET BY MOUTH DAILY   metFORMIN 500 MG tablet Commonly known as: Glucophage Take 1 tablet (500 mg total) by mouth 2 (two) times daily with a meal.   metolazone 2.5 MG tablet Commonly known as: ZAROXOLYN Take 2.5 mg by mouth daily as needed (fluid).   Narcan 4 MG/0.1ML Liqd nasal spray kit Generic drug: naloxone Place 0.4 mg into the nose daily as needed (opioid overdose).   ondansetron 4 MG disintegrating tablet Commonly known as: Zofran ODT Take 1 tablet (4 mg total) by mouth every 4 (four) hours as needed for nausea or vomiting.   Oxycodone HCl 10 MG Tabs Take 1 tablet (10 mg total) 4 (four) times daily by mouth. Per [facility] What changed:   when to take this  reasons to take this   pantoprazole 40 MG tablet Commonly known as: PROTONIX TAKE ONE TABLET BY MOUTH DAILY   potassium chloride 10 MEQ CR capsule Commonly known as: MICRO-K Take 2 capsules (20 mEq total) by mouth daily.   rOPINIRole 3 MG tablet Commonly known as: REQUIP TAKE 1 TABLET BY MOUTH NIGHTLY AT BEDTIME AS NEEDED What changed:   how  much to take  how to take this  when to take this  reasons to take this  additional instructions  Another medication with the same name was removed. Continue taking this medication, and follow the directions you see here.   VISINE  OP Place 1 drop into both eyes daily as needed (dry eyes).         Follow-up Information    Biagio Borg, MD. Schedule an appointment as soon as possible for a visit in 1 week(s).   Specialties: Internal Medicine, Radiology Contact information: [place] [address] [phone]               TOTAL DISCHARGE TIME: 57 minutes  [place]  [facility] Pager on [email or web address]  03/11/2021, 12:57 PM

## 2021-03-12 ENCOUNTER — Telehealth: Payer: Self-pay

## 2021-03-12 LAB — HIV-1/2 AB - DIFFERENTIATION
HIV 1 Ab: NONREACTIVE
HIV 2 Ab: NONREACTIVE
Note: NEGATIVE

## 2021-03-12 NOTE — Telephone Encounter (Signed)
Transition Care Management Follow-up Telephone Call  Date of discharge and from where: 03/11/2021 from [facility]  How have you been since you were released from the hospital? Feeling better, but still weak  Any questions or concerns? No  Items Reviewed:  Did the pt receive and understand the discharge instructions provided? Yes   Medications obtained and verified? Yes   Other? No   Any new allergies since your discharge? No   Dietary orders reviewed? Fluid restriction due to CHF  Do you have support at home? Yes   Home Care and Equipment/Supplies: Were home health services ordered? yes If so, what is the name of the agency? [facility]  Has the agency set up a time to come to the patient's home? Yes, nurse was there during phone visit Were any new equipment or medical supplies ordered?  No What is the name of the medical supply agency? n/a Were you able to get the supplies/equipment? no Do you have any questions related to the use of the equipment or supplies? No  Functional Questionnaire: (I = Independent and D = Dependent) ADLs: I  Bathing/Dressing- I  Meal Prep- I  Eating- I  Maintaining continence- I  Transferring/Ambulation- I, using a walker and cane for assistive devices  Managing Meds- I  Follow up appointments reviewed:   PCP Hospital f/u appt confirmed? Yes  Scheduled to see Cathlean Cower, MD on 03/17/2021 @ 2:40 pm.  [facility] f/u appt confirmed? No    Are transportation arrangements needed? No   If their condition worsens, is the pt aware to call PCP or go to the Emergency Dept.? Yes  Was the patient provided with contact information for the PCP's office or ED? Yes  Was to pt encouraged to call back with questions or concerns? Yes

## 2021-03-13 LAB — MISC LABCORP TEST (SEND OUT)
Labcorp test code: 83962
Labcorp test code: 83962

## 2021-03-17 ENCOUNTER — Encounter: Payer: Self-pay | Admitting: Internal Medicine

## 2021-03-17 ENCOUNTER — Other Ambulatory Visit: Payer: Self-pay

## 2021-03-17 ENCOUNTER — Ambulatory Visit (INDEPENDENT_AMBULATORY_CARE_PROVIDER_SITE_OTHER): Payer: Medicare Other | Admitting: Internal Medicine

## 2021-03-17 VITALS — BP 140/76 | HR 95 | Ht 65.0 in | Wt 362.0 lb

## 2021-03-17 DIAGNOSIS — I10 Essential (primary) hypertension: Secondary | ICD-10-CM | POA: Diagnosis not present

## 2021-03-17 DIAGNOSIS — E559 Vitamin D deficiency, unspecified: Secondary | ICD-10-CM | POA: Diagnosis not present

## 2021-03-17 DIAGNOSIS — I5030 Unspecified diastolic (congestive) heart failure: Secondary | ICD-10-CM | POA: Insufficient documentation

## 2021-03-17 DIAGNOSIS — R739 Hyperglycemia, unspecified: Secondary | ICD-10-CM | POA: Diagnosis not present

## 2021-03-17 DIAGNOSIS — I5032 Chronic diastolic (congestive) heart failure: Secondary | ICD-10-CM | POA: Diagnosis not present

## 2021-03-17 LAB — BASIC METABOLIC PANEL
BUN: 29 mg/dL — ABNORMAL HIGH (ref 6–23)
CO2: 32 mEq/L (ref 19–32)
Calcium: 10.1 mg/dL (ref 8.4–10.5)
Chloride: 95 mEq/L — ABNORMAL LOW (ref 96–112)
Creatinine, Ser: 1.01 mg/dL (ref 0.40–1.20)
GFR: 59.1 mL/min — ABNORMAL LOW (ref >60.00)
Glucose, Bld: 95 mg/dL (ref 70–99)
Potassium: 4.3 mEq/L (ref 3.5–5.1)
Sodium: 135 mEq/L (ref 135–145)

## 2021-03-17 NOTE — Patient Instructions (Signed)
Please continue all other medications as before, and refills have been done if requested.  Please have the pharmacy call with any other refills you may need.  Please keep your appointments with your specialists as you may have planned  Please go to the LAB at the blood drawing area for the tests to be done  You will be contacted by phone if any changes need to be made immediately.  Otherwise, you will receive a letter about your results with an explanation, but please check with MyChart first.  Please remember to sign up for MyChart if you have not done so, as this will be important to you in the future with finding out test results, communicating by private email, and scheduling acute appointments online when needed.  Please make an Appointment to return in 2 weeks

## 2021-03-17 NOTE — Progress Notes (Signed)
Patient ID: Maria [name], female   DOB: 08-06-1956, 65 y.o.   MRN: [id number]        Chief Complaint: follow up hopsn diast CHF, dm, vit d def, htn       HPI:  Maria [name] is a 65 y.o. female here post hospn with diuresis 17 lbs, overall still feels weak, tired, sleepy somewhat but has been getting paid meals from her insurance, has regained about 3 lbs at home, but Pt denies chest pain, increased sob or doe, wheezing, orthopnea, PND, increased LE swelling, palpitations, dizziness or syncope.   Pt denies polydipsia, polyuria, Denies new neuro focal s/s.   Pt denies fever, wt loss, night sweats, loss of appetite, or other constitutional symptoms  Denies worsening depressive symptoms, suicidal ideation, or panic; has ongoing anxiety.    Wt Readings from Last 3 Encounters:  03/17/21 (!) 362 lb (164.2 kg)  03/11/21 (!) 357 lb 9.4 oz (162.2 kg)  03/06/21 (!) 373 lb (169.2 kg)   BP Readings from Last 3 Encounters:  03/17/21 140/76  03/11/21 (!) 147/67  03/06/21 (!) 152/72         Past Medical History:  Diagnosis Date  . Acute lymphadenitis 2011  . ALLERGIC RHINITIS 08/10/2007  . ANXIETY 12/06/2007  . Atherosclerotic peripheral vascular disease ([place]) 06/13/2013   Aorta on CT June 2014  . BACK PAIN 12/06/2007  . Cellulitis 2011  . Cervical disc disease 03/09/2012  . Chronic pain 03/09/2012  . DEPRESSION 12/06/2007  . DIVERTICULOSIS, [place] 12/06/2007  . GERD 12/06/2007  . Hepatitis    age 52 hepatitis A  . HLD (hyperlipidemia) 05/24/2019  . HYPERTENSION 12/06/2007  . Impaired glucose tolerance 03/13/2012  . LOW BACK PAIN 12/06/2007  . Lumbar disc disease 03/09/2012  . Mesenteric adenitis   . MRSA 2006  . Pain    by breast area  . Pain in joint, multiple sites 04/21/2010  . PARONYCHIA, FINGER 08/13/2009  . Pneumonia sept 2015   walking pneumonia, sepsis   . RASH-NONVESICULAR 05/09/2008  . 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 SHOULDER PAIN, LEFT 05/09/2008  . SINUSITIS-  ACUTE-NOS 05/09/2008  . THORACIC/LUMBOSACRAL NEURITIS/RADICULITIS UNSPEC 12/28/2008   Past Surgical History:  Procedure Laterality Date  . ABDOMINAL HYSTERECTOMY  1999   1 ovary left  . bloo clot removed from neck   may 25th , june 2. 2010  . [place] SURGERY  may 24th 2010  . COLONOSCOPY WITH PROPOFOL N/A 07/10/2016   Procedure: COLONOSCOPY WITH PROPOFOL;  Surgeon: Manus Gunning, MD;  Location: [facility];  Service: Gastroenterology;  Laterality: N/A;  . COLONOSCOPY WITH PROPOFOL N/A 09/26/2019   Procedure: COLONOSCOPY WITH PROPOFOL;  Surgeon: Yetta Flock, MD;  Location: [facility];  Service: Gastroenterology;  Laterality: N/A;  . ESOPHAGOGASTRODUODENOSCOPY (EGD) WITH PROPOFOL N/A 07/10/2016   Procedure: ESOPHAGOGASTRODUODENOSCOPY (EGD) WITH PROPOFOL;  Surgeon: Manus Gunning, MD;  Location: [facility];  Service: Gastroenterology;  Laterality: N/A;  . POLYPECTOMY  09/26/2019   Procedure: POLYPECTOMY;  Surgeon: Yetta Flock, MD;  Location: [facility];  Service: Gastroenterology;;  . s/p ovary cyst    . s/p right knee arthroscopy     Dr. Mardelle Matte ortho    reports that she quit smoking about 12 years ago. She quit after 30.00 years of use. She has never used smokeless tobacco. She reports that she does not drink alcohol and does not use drugs. family history includes Anxiety disorder in her sister; Asthma in her daughter and sister;  Bipolar disorder in her daughter; Cancer in her maternal uncle and another family member; Depression in her sister; Diabetes in her mother; Heart disease in her mother; Heart failure in her mother; Hypertension in her father and mother; Liver cancer in her paternal grandmother. Allergies  Allergen Reactions  . Amoxicillin-Pot Clavulanate Nausea And Vomiting    Projectile vomiting Did it involve swelling of the face/tongue/throat, SOB, or low BP? No Did it involve sudden or severe rash/hives, skin peeling, or any reaction on  the inside of your mouth or nose? No Did you need to seek medical attention at a hospital or doctor's office? No When did it last happen?20-30 years If all above answers are "NO", may proceed with cephalosporin use.   . Adhesive [Tape] Other (See Comments)    Tears skin off - use paper tape   . Codeine Other (See Comments)    hallucinations  . Crestor [Rosuvastatin Calcium]     Severe muscle cramps  . Lactose Intolerance (Gi) Nausea And Vomiting  . Morphine And Related Nausea And Vomiting and Other (See Comments)    Migraine headaches  . Vicodin [Hydrocodone-Acetaminophen] Other (See Comments)    hallucinations  . Latex Rash   Current Outpatient Medications on File Prior to Visit  Medication Sig Dispense Refill  . acetaminophen (TYLENOL) 325 MG tablet Take 650 mg by mouth every 6 (six) hours as needed for headache (pain).    Marland Kitchen albuterol (PROAIR HFA) 108 (90 Base) MCG/ACT inhaler Inhale 2 puffs into the lungs every 6 (six) hours as needed for wheezing or shortness of breath. 1 each 0  . clonazePAM (KLONOPIN) 1 MG tablet TAKE 1 TABLET BY MOUTH 2 TIMES A DAY AS NEEDED FOR ANXIETY (Patient taking differently: Take 1 mg by mouth 2 (two) times daily as needed for anxiety.) 60 tablet 5  . cyclobenzaprine (FLEXERIL) 10 MG tablet TAKE 1 TABLET BY MOUTH 2 TIMES A DAY AS NEEDED FOR MUSCLE SPASMS (Patient taking differently: Take 10 mg by mouth 2 (two) times daily as needed for muscle spasms.) 180 tablet 1  . furosemide (LASIX) 20 MG tablet Take 3 tablets (60 mg total) by mouth 2 (two) times daily. 180 tablet 1  . LINZESS 145 MCG CAPS capsule Take 145 mcg by mouth daily as needed (constipation).    Marland Kitchen losartan (COZAAR) 100 MG tablet TAKE 1 TABLET BY MOUTH DAILY (Patient taking differently: Take 100 mg by mouth daily.) 90 tablet 1  . metFORMIN (GLUCOPHAGE) 500 MG tablet Take 1 tablet (500 mg total) by mouth 2 (two) times daily with a meal. 60 tablet 1  . metolazone (ZAROXOLYN) 2.5 MG tablet Take  2.5 mg by mouth daily as needed (fluid).    Karma Greaser 4 MG/0.1ML LIQD nasal spray kit Place 0.4 mg into the nose daily as needed (opioid overdose).    . ondansetron (ZOFRAN ODT) 4 MG disintegrating tablet Take 1 tablet (4 mg total) by mouth every 4 (four) hours as needed for nausea or vomiting. 20 tablet 0  . Oxycodone HCl 10 MG TABS Take 1 tablet (10 mg total) 4 (four) times daily by mouth. Per [facility] (Patient taking differently: Take 10 mg by mouth every 4 (four) hours as needed (pain). Per [facility]) 30 tablet 0  . pantoprazole (PROTONIX) 40 MG tablet TAKE ONE TABLET BY MOUTH DAILY (Patient taking differently: Take 40 mg by mouth daily.) 90 tablet 2  . potassium chloride (MICRO-K) 10 MEQ CR capsule Take 2 capsules (20 mEq  total) by mouth daily. 30 capsule 1  . rOPINIRole (REQUIP) 3 MG tablet TAKE 1 TABLET BY MOUTH NIGHTLY AT BEDTIME AS NEEDED (Patient taking differently: Take 3 mg by mouth at bedtime as needed (RLS).) 30 tablet 5  . Tetrahydrozoline HCl (VISINE OP) Place 1 drop into both eyes daily as needed (dry eyes).    . [DISCONTINUED] DULoxetine (CYMBALTA) 60 MG capsule Take 60 mg by mouth daily.      . [DISCONTINUED] fexofenadine (ALLEGRA) 180 MG tablet Take 180 mg by mouth daily.       No current facility-administered medications on file prior to visit.        ROS:  All others reviewed and negative.  Objective        PE:  BP 140/76 (BP Location: Left Arm, Patient Position: Sitting, Cuff Size: Large)   Pulse 95   Ht _0  (1.651 m)   Wt (!) 362 lb (164.2 kg)   SpO2 97%   BMI 60.24 kg/m                 Constitutional: Pt appears in NAD               HENT: Head: NCAT.                Right Ear: External ear normal.                 Left Ear: External ear normal.                Eyes: . Pupils are equal, round, and reactive to light. Conjunctivae and EOM are normal               Nose: without d/c or deformity               Neck: Neck supple. Gross normal  ROM               Cardiovascular: Normal rate and regular rhythm.                 Pulmonary/Chest: Effort normal and breath sounds without rales or wheezing.                Abd:  Soft, NT, ND, + BS, no organomegaly               Neurological: Pt is alert. At baseline orientation, motor grossly intact               Skin: Skin is warm. No rashes, no other new lesions, LE edema - trace bialt               Psychiatric: Pt behavior is normal without agitation   Micro: none  Cardiac tracings I have personally interpreted today:  none  Pertinent Radiological findings (summarize): none   Lab Results  Component Value Date   WBC 7.5 03/09/2021   HGB 12.3 03/09/2021   HCT 39.1 03/09/2021   PLT 261 03/09/2021   GLUCOSE 95 03/17/2021   CHOL 156 12/31/2020   TRIG 133.0 12/31/2020   HDL 46.90 12/31/2020   LDLDIRECT 127.0 01/25/2019   LDLCALC 83 12/31/2020   ALT 20 03/07/2021   AST 18 03/07/2021   NA 135 03/17/2021   K 4.3 03/17/2021   CL 95 (L) 03/17/2021   CREATININE 1.01 03/17/2021   BUN 29 (H) 03/17/2021   CO2 32 03/17/2021   TSH 1.45 12/31/2020   INR 1.1 08/23/2019   HGBA1C 6.5 (H) 03/07/2021  Assessment/Plan:  Dean Wonder Sconyers is a 65 y.o. White or Caucasian [1] female with  has a past medical history of Acute lymphadenitis (2011), ALLERGIC RHINITIS (08/10/2007), ANXIETY (12/06/2007), Atherosclerotic peripheral vascular disease ([place]) (06/13/2013), BACK PAIN (12/06/2007), Cellulitis (2011), Cervical disc disease (03/09/2012), Chronic pain (03/09/2012), DEPRESSION (12/06/2007), DIVERTICULOSIS, [place] (12/06/2007), GERD (12/06/2007), Hepatitis, HLD (hyperlipidemia) (05/24/2019), HYPERTENSION (12/06/2007), Impaired glucose tolerance (03/13/2012), LOW BACK PAIN (12/06/2007), Lumbar disc disease (03/09/2012), Mesenteric adenitis, MRSA (2006), Pain, Pain in joint, multiple sites (04/21/2010), PARONYCHIA, FINGER (08/13/2009), Pneumonia (sept 2015), RASH-NONVESICULAR (05/09/2008), Sclerosing mesenteritis  ([place]) (11/08/2017), SHOULDER PAIN, LEFT (05/09/2008), SINUSITIS- ACUTE-NOS (05/09/2008), and THORACIC/LUMBOSACRAL NEURITIS/RADICULITIS UNSPEC (12/28/2008).  Diastolic congestive heart failure (H[facility]) Cont to monitor for increased wt, cont current med tx   Current Outpatient Medications (Endocrine & Metabolic):  .  metFORMIN (GLUCOPHAGE) 500 MG tablet, Take 1 tablet (500 mg total) by mouth 2 (two) times daily with a meal.  Current Outpatient Medications (Cardiovascular):  .  furosemide (LASIX) 20 MG tablet, Take 3 tablets (60 mg total) by mouth 2 (two) times daily. Marland Kitchen  losartan (COZAAR) 100 MG tablet, TAKE 1 TABLET BY MOUTH DAILY (Patient taking differently: Take 100 mg by mouth daily.) .  metolazone (ZAROXOLYN) 2.5 MG tablet, Take 2.5 mg by mouth daily as needed (fluid).  Current Outpatient Medications (Respiratory):  .  albuterol (PROAIR HFA) 108 (90 Base) MCG/ACT inhaler, Inhale 2 puffs into the lungs every 6 (six) hours as needed for wheezing or shortness of breath.  Current Outpatient Medications (Analgesics):  .  acetaminophen (TYLENOL) 325 MG tablet, Take 650 mg by mouth every 6 (six) hours as needed for headache (pain). .  Oxycodone HCl 10 MG TABS, Take 1 tablet (10 mg total) 4 (four) times daily by mouth. Per [facility] (Patient taking differently: Take 10 mg by mouth every 4 (four) hours as needed (pain). Per [facility])   Current Outpatient Medications (Other):  .  clonazePAM (KLONOPIN) 1 MG tablet, TAKE 1 TABLET BY MOUTH 2 TIMES A DAY AS NEEDED FOR ANXIETY (Patient taking differently: Take 1 mg by mouth 2 (two) times daily as needed for anxiety.) .  cyclobenzaprine (FLEXERIL) 10 MG tablet, TAKE 1 TABLET BY MOUTH 2 TIMES A DAY AS NEEDED FOR MUSCLE SPASMS (Patient taking differently: Take 10 mg by mouth 2 (two) times daily as needed for muscle spasms.) .  LINZESS 145 MCG CAPS capsule, Take 145 mcg by mouth daily as needed (constipation). Karma Greaser 4 MG/0.1ML LIQD nasal  spray kit, Place 0.4 mg into the nose daily as needed (opioid overdose). .  ondansetron (ZOFRAN ODT) 4 MG disintegrating tablet, Take 1 tablet (4 mg total) by mouth every 4 (four) hours as needed for nausea or vomiting. .  pantoprazole (PROTONIX) 40 MG tablet, TAKE ONE TABLET BY MOUTH DAILY (Patient taking differently: Take 40 mg by mouth daily.) .  potassium chloride (MICRO-K) 10 MEQ CR capsule, Take 2 capsules (20 mEq total) by mouth daily. Marland Kitchen  rOPINIRole (REQUIP) 3 MG tablet, TAKE 1 TABLET BY MOUTH NIGHTLY AT BEDTIME AS NEEDED (Patient taking differently: Take 3 mg by mouth at bedtime as needed (RLS).) .  Tetrahydrozoline HCl (VISINE OP), Place 1 drop into both eyes daily as needed (dry eyes).   Vitamin D deficiency Last vitamin D Lab Results  Component Value Date   VD25OH 24.56 (L) 12/31/2020   Low to start oral replacement  Hyperglycemia Lab Results  Component Value Date   HGBA1C 6.5 (H) 03/07/2021   Stable, pt to  continue current medical treatment - new metformin   Benign hypertension BP Readings from Last 3 Encounters:  03/17/21 140/76  03/11/21 (!) 147/67  03/06/21 (!) 152/72   Stable, pt to continue medical treatment losartan   Followup: Return in about 2 weeks (around 03/31/2021).  Cathlean Cower, MD 03/26/2021 9:26 PM [facility]

## 2021-03-23 DIAGNOSIS — G894 Chronic pain syndrome: Secondary | ICD-10-CM | POA: Diagnosis not present

## 2021-03-25 DIAGNOSIS — I10 Essential (primary) hypertension: Secondary | ICD-10-CM | POA: Diagnosis not present

## 2021-03-25 DIAGNOSIS — I5033 Acute on chronic diastolic (congestive) heart failure: Secondary | ICD-10-CM | POA: Diagnosis not present

## 2021-03-26 ENCOUNTER — Encounter: Payer: Self-pay | Admitting: Internal Medicine

## 2021-03-26 NOTE — Assessment & Plan Note (Signed)
Last vitamin D Lab Results  Component Value Date   VD25OH 24.56 (L) 12/31/2020   Low to start oral replacement

## 2021-03-26 NOTE — Assessment & Plan Note (Signed)
Cont to monitor for increased wt, cont current med tx   Current Outpatient Medications (Endocrine & Metabolic):  .  metFORMIN (GLUCOPHAGE) 500 MG tablet, Take 1 tablet (500 mg total) by mouth 2 (two) times daily with a meal.  Current Outpatient Medications (Cardiovascular):  .  furosemide (LASIX) 20 MG tablet, Take 3 tablets (60 mg total) by mouth 2 (two) times daily. Marland Kitchen  losartan (COZAAR) 100 MG tablet, TAKE 1 TABLET BY MOUTH DAILY (Patient taking differently: Take 100 mg by mouth daily.) .  metolazone (ZAROXOLYN) 2.5 MG tablet, Take 2.5 mg by mouth daily as needed (fluid).  Current Outpatient Medications (Respiratory):  .  albuterol (PROAIR HFA) 108 (90 Base) MCG/ACT inhaler, Inhale 2 puffs into the lungs every 6 (six) hours as needed for wheezing or shortness of breath.  Current Outpatient Medications (Analgesics):  .  acetaminophen (TYLENOL) 325 MG tablet, Take 650 mg by mouth every 6 (six) hours as needed for headache (pain). .  Oxycodone HCl 10 MG TABS, Take 1 tablet (10 mg total) 4 (four) times daily by mouth. Per [facility] (Patient taking differently: Take 10 mg by mouth every 4 (four) hours as needed (pain). Per [facility])   Current Outpatient Medications (Other):  .  clonazePAM (KLONOPIN) 1 MG tablet, TAKE 1 TABLET BY MOUTH 2 TIMES A DAY AS NEEDED FOR ANXIETY (Patient taking differently: Take 1 mg by mouth 2 (two) times daily as needed for anxiety.) .  cyclobenzaprine (FLEXERIL) 10 MG tablet, TAKE 1 TABLET BY MOUTH 2 TIMES A DAY AS NEEDED FOR MUSCLE SPASMS (Patient taking differently: Take 10 mg by mouth 2 (two) times daily as needed for muscle spasms.) .  LINZESS 145 MCG CAPS capsule, Take 145 mcg by mouth daily as needed (constipation). Karma Greaser 4 MG/0.1ML LIQD nasal spray kit, Place 0.4 mg into the nose daily as needed (opioid overdose). .  ondansetron (ZOFRAN ODT) 4 MG disintegrating tablet, Take 1 tablet (4 mg total) by mouth every 4 (four) hours as needed  for nausea or vomiting. .  pantoprazole (PROTONIX) 40 MG tablet, TAKE ONE TABLET BY MOUTH DAILY (Patient taking differently: Take 40 mg by mouth daily.) .  potassium chloride (MICRO-K) 10 MEQ CR capsule, Take 2 capsules (20 mEq total) by mouth daily. Marland Kitchen  rOPINIRole (REQUIP) 3 MG tablet, TAKE 1 TABLET BY MOUTH NIGHTLY AT BEDTIME AS NEEDED (Patient taking differently: Take 3 mg by mouth at bedtime as needed (RLS).) .  Tetrahydrozoline HCl (VISINE OP), Place 1 drop into both eyes daily as needed (dry eyes).

## 2021-03-26 NOTE — Assessment & Plan Note (Signed)
Lab Results  Component Value Date   HGBA1C 6.5 (H) 03/07/2021   Stable, pt to continue current medical treatment - new metformin

## 2021-03-26 NOTE — Assessment & Plan Note (Signed)
BP Readings from Last 3 Encounters:  03/17/21 140/76  03/11/21 (!) 147/67  03/06/21 (!) 152/72   Stable, pt to continue medical treatment losartan

## 2021-03-28 DIAGNOSIS — G990 Autonomic neuropathy in diseases classified elsewhere: Secondary | ICD-10-CM | POA: Diagnosis not present

## 2021-03-28 DIAGNOSIS — Z79891 Long term (current) use of opiate analgesic: Secondary | ICD-10-CM | POA: Diagnosis not present

## 2021-03-28 DIAGNOSIS — M79661 Pain in right lower leg: Secondary | ICD-10-CM | POA: Diagnosis not present

## 2021-03-28 DIAGNOSIS — M1711 Unilateral primary osteoarthritis, right knee: Secondary | ICD-10-CM | POA: Diagnosis not present

## 2021-03-28 DIAGNOSIS — G8929 Other chronic pain: Secondary | ICD-10-CM | POA: Diagnosis not present

## 2021-03-28 DIAGNOSIS — M25551 Pain in right hip: Secondary | ICD-10-CM | POA: Diagnosis not present

## 2021-03-28 DIAGNOSIS — M542 Cervicalgia: Secondary | ICD-10-CM | POA: Diagnosis not present

## 2021-03-28 DIAGNOSIS — G894 Chronic pain syndrome: Secondary | ICD-10-CM | POA: Diagnosis not present

## 2021-03-28 DIAGNOSIS — M545 Low back pain, unspecified: Secondary | ICD-10-CM | POA: Diagnosis not present

## 2021-03-28 DIAGNOSIS — M25561 Pain in right knee: Secondary | ICD-10-CM | POA: Diagnosis not present

## 2021-03-28 DIAGNOSIS — M25552 Pain in left hip: Secondary | ICD-10-CM | POA: Diagnosis not present

## 2021-04-01 ENCOUNTER — Emergency Department (HOSPITAL_COMMUNITY): Payer: Medicare Other

## 2021-04-01 ENCOUNTER — Telehealth: Payer: Self-pay | Admitting: Internal Medicine

## 2021-04-01 ENCOUNTER — Emergency Department (HOSPITAL_COMMUNITY)
Admission: EM | Admit: 2021-04-01 | Discharge: 2021-04-01 | Disposition: A | Discharge status: Home / Self Care | Payer: Medicare Other | Attending: Emergency Medicine | Admitting: Emergency Medicine

## 2021-04-01 ENCOUNTER — Ambulatory Visit: Payer: Medicare Other | Admitting: Internal Medicine

## 2021-04-01 ENCOUNTER — Other Ambulatory Visit: Payer: Self-pay

## 2021-04-01 ENCOUNTER — Encounter (HOSPITAL_COMMUNITY): Payer: Self-pay

## 2021-04-01 DIAGNOSIS — M7989 Other specified soft tissue disorders: Secondary | ICD-10-CM | POA: Diagnosis present

## 2021-04-01 DIAGNOSIS — I517 Cardiomegaly: Secondary | ICD-10-CM | POA: Diagnosis not present

## 2021-04-01 DIAGNOSIS — E119 Type 2 diabetes mellitus without complications: Secondary | ICD-10-CM | POA: Diagnosis not present

## 2021-04-01 DIAGNOSIS — Z91040 Latex allergy status: Secondary | ICD-10-CM | POA: Diagnosis not present

## 2021-04-01 DIAGNOSIS — Z7984 Long term (current) use of oral hypoglycemic drugs: Secondary | ICD-10-CM | POA: Diagnosis not present

## 2021-04-01 DIAGNOSIS — I5033 Acute on chronic diastolic (congestive) heart failure: Secondary | ICD-10-CM

## 2021-04-01 DIAGNOSIS — Z85038 Personal history of other malignant neoplasm of large intestine: Secondary | ICD-10-CM | POA: Diagnosis not present

## 2021-04-01 DIAGNOSIS — I110 Hypertensive heart disease with heart failure: Secondary | ICD-10-CM | POA: Diagnosis not present

## 2021-04-01 DIAGNOSIS — Z87891 Personal history of nicotine dependence: Secondary | ICD-10-CM | POA: Diagnosis not present

## 2021-04-01 DIAGNOSIS — J811 Chronic pulmonary edema: Secondary | ICD-10-CM | POA: Diagnosis not present

## 2021-04-01 DIAGNOSIS — R0602 Shortness of breath: Secondary | ICD-10-CM | POA: Diagnosis not present

## 2021-04-01 DIAGNOSIS — Z79899 Other long term (current) drug therapy: Secondary | ICD-10-CM | POA: Insufficient documentation

## 2021-04-01 DIAGNOSIS — I878 Other specified disorders of veins: Secondary | ICD-10-CM | POA: Diagnosis not present

## 2021-04-01 LAB — COMPREHENSIVE METABOLIC PANEL
ALT: 21 U/L (ref 0–44)
AST: 22 U/L (ref 15–41)
Albumin: 4 g/dL (ref 3.5–5.0)
Alkaline Phosphatase: 72 U/L (ref 38–126)
Anion gap: 11 (ref 5–15)
BUN: 23 mg/dL (ref 8–23)
CO2: 34 mmol/L — ABNORMAL HIGH (ref 22–32)
Calcium: 9.1 mg/dL (ref 8.9–10.3)
Chloride: 90 mmol/L — ABNORMAL LOW (ref 98–111)
Creatinine, Ser: 1.11 mg/dL — ABNORMAL HIGH (ref 0.44–1.00)
GFR, Estimated: 56 mL/min — ABNORMAL LOW (ref >60)
Glucose, Bld: 111 mg/dL — ABNORMAL HIGH (ref 70–99)
Potassium: 3 mmol/L — ABNORMAL LOW (ref 3.5–5.1)
Sodium: 135 mmol/L (ref 135–145)
Total Bilirubin: 0.9 mg/dL (ref 0.3–1.2)
Total Protein: 7.5 g/dL (ref 6.5–8.1)

## 2021-04-01 LAB — TROPONIN I (HIGH SENSITIVITY)
Troponin I (High Sensitivity): 3 ng/L (ref <18)
Troponin I (High Sensitivity): <2 ng/L (ref <18)

## 2021-04-01 LAB — CBC WITH DIFFERENTIAL/PLATELET
Abs Immature Granulocytes: 0.02 10*3/uL (ref 0.00–0.07)
Basophils Absolute: 0.1 10*3/uL (ref 0.0–0.1)
Basophils Relative: 1 %
Eosinophils Absolute: 0.1 10*3/uL (ref 0.0–0.5)
Eosinophils Relative: 1 %
HCT: 37.6 % (ref 36.0–46.0)
Hemoglobin: 12.4 g/dL (ref 12.0–15.0)
Immature Granulocytes: 0 %
Lymphocytes Relative: 32 %
Lymphs Abs: 3.1 10*3/uL (ref 0.7–4.0)
MCH: 30.3 pg (ref 26.0–34.0)
MCHC: 33 g/dL (ref 30.0–36.0)
MCV: 91.9 fL (ref 80.0–100.0)
Monocytes Absolute: 0.7 10*3/uL (ref 0.1–1.0)
Monocytes Relative: 8 %
Neutro Abs: 5.6 10*3/uL (ref 1.7–7.7)
Neutrophils Relative %: 58 %
Platelets: 281 10*3/uL (ref 150–400)
RBC: 4.09 MIL/uL (ref 3.87–5.11)
RDW: 12.9 % (ref 11.5–15.5)
WBC: 9.7 10*3/uL (ref 4.0–10.5)
nRBC: 0 % (ref 0.0–0.2)

## 2021-04-01 LAB — URINALYSIS, ROUTINE W REFLEX MICROSCOPIC
Bacteria, UA: NONE SEEN
Bilirubin Urine: NEGATIVE
Glucose, UA: NEGATIVE mg/dL
Hgb urine dipstick: NEGATIVE
Ketones, ur: NEGATIVE mg/dL
Nitrite: NEGATIVE
Protein, ur: NEGATIVE mg/dL
Specific Gravity, Urine: 1.004 — ABNORMAL LOW (ref 1.005–1.030)
pH: 6 (ref 5.0–8.0)

## 2021-04-01 LAB — BRAIN NATRIURETIC PEPTIDE: B Natriuretic Peptide: 16.4 pg/mL (ref 0.0–100.0)

## 2021-04-01 LAB — PROTIME-INR
INR: 1 (ref 0.8–1.2)
Prothrombin Time: 13.6 seconds (ref 11.4–15.2)

## 2021-04-01 MED ORDER — FUROSEMIDE 10 MG/ML IJ SOLN
40.0000 mg | Freq: Once | INTRAMUSCULAR | Status: AC
  Administered 2021-04-01: 40 mg via INTRAVENOUS
  Filled 2021-04-01: qty 4

## 2021-04-01 MED ORDER — ALBUTEROL SULFATE HFA 108 (90 BASE) MCG/ACT IN AERS
2.0000 | INHALATION_SPRAY | RESPIRATORY_TRACT | Status: DC | PRN
  Administered 2021-04-01: 2 via RESPIRATORY_TRACT
  Filled 2021-04-01: qty 6.7

## 2021-04-01 MED ORDER — POTASSIUM CHLORIDE CRYS ER 20 MEQ PO TBCR
60.0000 meq | EXTENDED_RELEASE_TABLET | Freq: Once | ORAL | Status: AC
  Administered 2021-04-01: 60 meq via ORAL
  Filled 2021-04-01: qty 3

## 2021-04-01 NOTE — ED Triage Notes (Signed)
patient reports that she was hospitalized last month for CHF and was receiving IV Lasix. Patient states that she is compliant with Lasix po and eating right. Patient states she has gained 5 lbs overnight. Patient states her abdomen is tight and she can feel it in her back. Patient c/o SOB with exertion I. Like walking to he bathroom.

## 2021-04-01 NOTE — ED Provider Notes (Signed)
[facility] DEPT Provider Note   CSN: [id number] Arrival date & time: 04/01/21  1233     History Chief Complaint  Patient presents with  . fluid retention  . Shortness of Breath    Maria [name] is a 65 y.o. female.  HPI Patient reports a 5 pound weight gain over the past day.  She feels slightly increased shortness of breath.  She notes swelling in her legs and abdomen.  No chest pain.  No fever no cough.  Patient reports she is taking Lasix 60 mg twice a day and potassium without improvement of symptoms.  She reports she is trying to modify her diet but continues to have fluid retained.    Past Medical History:  Diagnosis Date  . Acute lymphadenitis 2011  . ALLERGIC RHINITIS 08/10/2007  . ANXIETY 12/06/2007  . Atherosclerotic peripheral vascular disease ([place]) 06/13/2013   Aorta on CT June 2014  . BACK PAIN 12/06/2007  . Cellulitis 2011  . Cervical disc disease 03/09/2012  . Chronic pain 03/09/2012  . DEPRESSION 12/06/2007  . DIVERTICULOSIS, [place] 12/06/2007  . GERD 12/06/2007  . Hepatitis    age 62 hepatitis A  . HLD (hyperlipidemia) 05/24/2019  . HYPERTENSION 12/06/2007  . Impaired glucose tolerance 03/13/2012  . LOW BACK PAIN 12/06/2007  . Lumbar disc disease 03/09/2012  . Mesenteric adenitis   . MRSA 2006  . Pain    by breast area  . Pain in joint, multiple sites 04/21/2010  . PARONYCHIA, FINGER 08/13/2009  . Pneumonia sept 2015   walking pneumonia, sepsis   . RASH-NONVESICULAR 05/09/2008  . 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 SHOULDER PAIN, LEFT 05/09/2008  . SINUSITIS- ACUTE-NOS 05/09/2008  . THORACIC/LUMBOSACRAL NEURITIS/RADICULITIS UNSPEC 12/28/2008    Patient Active Problem List   Diagnosis Date Noted  . Diastolic congestive heart failure ([place]) 03/17/2021  . Diabetes mellitus type 2 in obese ([place]) 03/11/2021  . Acute diastolic CHF (congestive heart failure) ([place]) 03/07/2021  . Acute on chronic diastolic (congestive) heart  failure ([place]) 03/06/2021  . Vitamin D deficiency 01/01/2021  . Medial epicondylitis 12/31/2020  . Aortic atherosclerosis ([place]) 09/26/2020  . Umbilical hernia 40/98/1191  . Constipation 08/05/2020  . Arthritis 06/12/2020  . Hoarseness 05/01/2020  . Vitamin B 12 deficiency 01/31/2020  . Rosacea 01/31/2020  . Neck swelling 01/31/2020  . History of colonic polyps   . Benign neoplasm of [place]   . Nausea 08/31/2019  . Throat pain 07/26/2019  . HLD (hyperlipidemia) 05/24/2019  . Leg cramps 05/24/2019  . Lump in neck 05/24/2019  . Hyperglycemia 01/25/2019  . Left thyroid nodule 01/25/2019  . Jerking movements of extremities 07/20/2018  . Balance disorder 07/20/2018  . Right knee pain 03/21/2018  . OSA (obstructive sleep apnea) 03/21/2018  . Oxygen desaturation 03/21/2018  . Urinary symptom or sign 03/21/2018  . Acquired lymphedema 01/25/2018  . Gait disorder 01/25/2018  . Peripheral edema 01/16/2018  . SOB (shortness of breath) 01/15/2018  . Dysphonia 12/07/2017  . Encounter for well adult exam with abnormal findings 11/08/2017  . 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[place]) 05/31/2017  . [place] polyp 08/11/2016  . Abdominal pain, epigastric   . Dysphagia   . [place] cancer screening   . Prediabetes 07/01/2016  . ACE-inhibitor cough 12/05/2015  . Restless legs syndrome 10/08/2015  . Venous stasis dermatitis of both lower extremities 04/26/2015  . 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 AR (allergic rhinitis) 10/17/2014  . Fatty liver 10/17/2014  . Chronic pain syndrome 09/19/2014  .  Post-traumatic osteoarthritis of both knees 09/19/2014  . Spinal stenosis 09/19/2014  . Depression, major, single episode, complete remission ([place]) 09/19/2014  . Narcotic dependence ([place]) 09/19/2014  . Spondylosis of 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 03/09/2012  . Lumbar disc disease 03/09/2012  . Chronic pain 03/09/2012  . Morbid obesity ([place]) 06/17/2011  . Chronic low back pain  12/28/2008  . Anxiety state 12/06/2007  . Depression 12/06/2007  . Benign hypertension 12/06/2007  . Gastroesophageal reflux disease without esophagitis 12/06/2007  . LOW BACK PAIN 12/06/2007    Past Surgical History:  Procedure Laterality Date  . ABDOMINAL HYSTERECTOMY  1999   1 ovary left  . bloo clot removed from neck   may 25th , june 2. 2010  . [place] SURGERY  may 24th 2010  . COLONOSCOPY WITH PROPOFOL N/A 07/10/2016   Procedure: COLONOSCOPY WITH PROPOFOL;  Surgeon: Manus Gunning, MD;  Location: [facility];  Service: Gastroenterology;  Laterality: N/A;  . COLONOSCOPY WITH PROPOFOL N/A 09/26/2019   Procedure: COLONOSCOPY WITH PROPOFOL;  Surgeon: Yetta Flock, MD;  Location: [facility];  Service: Gastroenterology;  Laterality: N/A;  . ESOPHAGOGASTRODUODENOSCOPY (EGD) WITH PROPOFOL N/A 07/10/2016   Procedure: ESOPHAGOGASTRODUODENOSCOPY (EGD) WITH PROPOFOL;  Surgeon: Manus Gunning, MD;  Location: [facility];  Service: Gastroenterology;  Laterality: N/A;  . POLYPECTOMY  09/26/2019   Procedure: POLYPECTOMY;  Surgeon: Yetta Flock, MD;  Location: [facility];  Service: Gastroenterology;;  . s/p ovary cyst    . s/p right knee arthroscopy     Dr. Mardelle Matte ortho     OB History   No obstetric history on file.     Family History  Problem Relation Age of Onset  . Heart disease Mother   . Hypertension Mother   . Diabetes Mother   . Heart failure Mother   . Asthma Sister   . Anxiety disorder Sister   . Depression Sister   . Hypertension Father   . Asthma Daughter   . Bipolar disorder Daughter   . Cancer Maternal Uncle        [place]  . Cancer Other        ovarian  . Liver cancer Paternal Grandmother        ????    Social History   Tobacco Use  . Smoking status: Former Smoker    Years: 30.00    Quit date: 11/08/2008    Years since quitting: 12.4  . Smokeless tobacco: Never Used  . Tobacco comment: quit 10/09  Vaping Use  .  Vaping Use: Never used  Substance Use Topics  . Alcohol use: No    Comment: quit drinking 10/07/1997  . Drug use: No    Home Medications Prior to Admission medications   Medication Sig Start Date End Date Taking? Authorizing Provider  acetaminophen (TYLENOL) 325 MG tablet Take 650 mg by mouth every 6 (six) hours as needed for headache (pain).    [provider]  albuterol (PROAIR HFA) 108 (90 Base) MCG/ACT inhaler Inhale 2 puffs into the lungs every 6 (six) hours as needed for wheezing or shortness of breath. 11/05/20   Lucretia Kern, DO  clonazePAM (KLONOPIN) 1 MG tablet TAKE 1 TABLET BY MOUTH 2 TIMES A DAY AS NEEDED FOR ANXIETY Patient taking differently: Take 1 mg by mouth 2 (two) times daily as needed for anxiety. 12/10/20   Biagio Borg, MD  cyclobenzaprine (FLEXERIL) 10 MG tablet TAKE 1 TABLET BY MOUTH 2 TIMES A DAY AS NEEDED  FOR MUSCLE SPASMS Patient taking differently: Take 10 mg by mouth 2 (two) times daily as needed for muscle spasms. 02/27/21   Biagio Borg, MD  furosemide (LASIX) 20 MG tablet Take 3 tablets (60 mg total) by mouth 2 (two) times daily. 03/11/21   Bonnielee Haff, MD  LINZESS 145 MCG CAPS capsule Take 145 mcg by mouth daily as needed (constipation). 08/15/20   [provider]  losartan (COZAAR) 100 MG tablet TAKE 1 TABLET BY MOUTH DAILY Patient taking differently: Take 100 mg by mouth daily. 08/06/20   Biagio Borg, MD  metFORMIN (GLUCOPHAGE) 500 MG tablet Take 1 tablet (500 mg total) by mouth 2 (two) times daily with a meal. 03/11/21 05/10/21  Bonnielee Haff, MD  metolazone (ZAROXOLYN) 2.5 MG tablet Take 2.5 mg by mouth daily as needed (fluid). 06/11/20   [provider]  NARCAN 4 MG/0.1ML LIQD nasal spray kit Place 0.4 mg into the nose daily as needed (opioid overdose). 04/25/19   [provider]  ondansetron (ZOFRAN ODT) 4 MG disintegrating tablet Take 1 tablet (4 mg total) by mouth every 4 (four) hours as needed for nausea or  vomiting. 08/23/19   Charlesetta Shanks, MD  Oxycodone HCl 10 MG TABS Take 1 tablet (10 mg total) 4 (four) times daily by mouth. Per [facility] Patient taking differently: Take 10 mg by mouth every 4 (four) hours as needed (pain). Per [facility] 11/08/17   Biagio Borg, MD  pantoprazole (PROTONIX) 40 MG tablet TAKE ONE TABLET BY MOUTH DAILY Patient taking differently: Take 40 mg by mouth daily. 03/03/21   Biagio Borg, MD  potassium chloride (MICRO-K) 10 MEQ CR capsule Take 2 capsules (20 mEq total) by mouth daily. 03/11/21   Bonnielee Haff, MD  rOPINIRole (REQUIP) 3 MG tablet TAKE 1 TABLET BY MOUTH NIGHTLY AT BEDTIME AS NEEDED Patient taking differently: Take 3 mg by mouth at bedtime as needed (RLS). 02/04/21   Biagio Borg, MD  Tetrahydrozoline HCl (VISINE OP) Place 1 drop into both eyes daily as needed (dry eyes).    [provider]  DULoxetine (CYMBALTA) 60 MG capsule Take 60 mg by mouth daily.    03/09/12  [provider]  fexofenadine (ALLEGRA) 180 MG tablet Take 180 mg by mouth daily.    03/09/12  [provider]    Allergies    Amoxicillin-pot clavulanate, Adhesive [tape], Codeine, Crestor [rosuvastatin calcium], Lactose intolerance (gi), Morphine and related, Vicodin [hydrocodone-acetaminophen], and Latex  Review of Systems   Review of Systems 10 systems reviewed and negative except as per HPI Physical Exam Updated Vital Signs BP (!) 132/46   Pulse 80   Temp 98.6 F (37 C)   Resp 18   Ht 5' (1.524 m)   Wt (!) 164.2 kg   SpO2 100%   BMI 70.70 kg/m   Physical Exam Constitutional:      Comments: Alert nontoxic.  No respiratory distress at rest.  HENT:     Mouth/Throat:     Pharynx: Oropharynx is clear.  Eyes:     Extraocular Movements: Extraocular movements intact.  Cardiovascular:     Rate and Rhythm: Normal rate and regular rhythm.  Pulmonary:     Effort: Pulmonary effort is normal.     Breath sounds: Normal breath sounds.   Abdominal:     Palpations: Abdomen is soft.     Comments: Abdomen is morbidly obese.  I do not perceive ascites.  Musculoskeletal:  Comments: 2+ edema bilateral lower extremities.  Mild erythema consistent with chronic venous stasis.  Skin:    General: Skin is warm and dry.  Neurological:     General: No focal deficit present.     Mental Status: She is oriented to person, place, and time.     Coordination: Coordination normal.  Psychiatric:        Mood and Affect: Mood normal.     ED Results / Procedures / Treatments   Labs (all labs ordered are listed, but only abnormal results are displayed) Labs Reviewed  COMPREHENSIVE METABOLIC PANEL - Abnormal; Notable for the following components:      Result Value   Potassium 3.0 (*)    Chloride 90 (*)    CO2 34 (*)    Glucose, Bld 111 (*)    Creatinine, Ser 1.11 (*)    GFR, Estimated 56 (*)    All other components within normal limits  BRAIN NATRIURETIC PEPTIDE  CBC WITH DIFFERENTIAL/PLATELET  PROTIME-INR  URINALYSIS, ROUTINE W REFLEX MICROSCOPIC  TROPONIN I (HIGH SENSITIVITY)  TROPONIN I (HIGH SENSITIVITY)    EKG EKG Interpretation  Date/Time:  Tuesday April 01 2021 13:02:19 EDT Ventricular Rate:  84 PR Interval:  169 QRS Duration: 120 QT Interval:  405 QTC Calculation: 479 R Axis:   57 Text Interpretation: Sinus rhythm Nonspecific intraventricular conduction delay 12 Lead; [place] no sig change from previous Confirmed by Charlesetta Shanks [phone]) on 04/01/2021 1:28:05 PM   Radiology DG Chest 2 View  Result Date: 04/01/2021 CLINICAL DATA:  Shortness of breath EXAM: CHEST - 2 VIEW COMPARISON:  March 06, 2021. FINDINGS: Similar enlargement of the cardiac silhouette. Similar central pulmonary vascular congestion. No consolidation. No visible pleural effusions or pneumothorax. Partially imaged cervical ACDF. IMPRESSION: Similar cardiomegaly and central vascular congestion without overt pulmonary edema. Electronically  Signed   By: Margaretha Sheffield MD   On: 04/01/2021 14:16    Procedures Procedures   Medications Ordered in ED Medications  albuterol (VENTOLIN HFA) 108 (90 Base) MCG/ACT inhaler 2 puff (2 puffs Inhalation Given 04/01/21 1400)  furosemide (LASIX) injection 40 mg (40 mg Intravenous Given 04/01/21 1539)  potassium chloride SA (KLOR-CON) CR tablet 60 mEq (60 mEq Oral Given 04/01/21 1539)    ED Course  I have reviewed the triage vital signs and the nursing notes.  Pertinent labs & imaging results that were available during my care of the patient were reviewed by me and considered in my medical decision making (see chart for details).    MDM Rules/Calculators/A&P                          Patient has chronic diastolic congestive heart failure.  Last hospitalization patient diuresed about 5 kg.  At this time, clinically mild signs of volume overload however no significant change in chest x-ray and BNP is normal.  Lungs grossly clear.  Patient does not have any hypoxia.  Blood pressures are normal.  Patient does have hypokalemia with chronic potassium use.  At this time does not appear to acutely need hospitalization.  Will increase Lasix dose for 3 more days and increase potassium dose.  Patient will need close follow-up with her PCP.  Patient reports she does not have cardiologist.  I think she would also benefit from management by cardiology and potentially nephrology.  I have made the suggestions.  She also appears to have an element of chronic venous stasis.  We discussed management of  chronic venous stasis as well. Final Clinical Impression(s) / ED Diagnoses Final diagnoses:  Chronic venous stasis  Acute on chronic diastolic congestive heart failure [facility])    Rx / DC Orders ED Discharge Orders    None       Charlesetta Shanks, MD 04/01/21 1552

## 2021-04-01 NOTE — Discharge Instructions (Signed)
1.  You continue to have mild exacerbation of congestive heart failure.  At this time your oxygen levels are good, your chest x-ray does not show any increasing congestion.  Kidney function is stable.  You were given an extra IV dose of Lasix in the emergency department.  Increase your home dose to 80 mg in the morning and continue your evening 60 mg for the next 3 days.  Make an appointment to see your family doctor and the cardiologist within the next week.  Contact information for [facility] is included in your discharge instructions follow-up as soon as possible. 2.  Whenever you are taking Lasix you are likely to get low potassium.  You need to take extra potassium.  Add an afternoon dose to your regular potassium dose of 60 mEq twice a day.  This will need to be carefully monitored by your doctor.  You also may benefit from seeing a nephrologist who is a specialist for kidneys kidneys, fluid balance and hypertension.

## 2021-04-01 NOTE — Telephone Encounter (Signed)
Patient states overnight she has gained 5 pounds in fluid, she said when she was here last Dr. [name] Reichmann told her to go to the ER. She wanted to talk to someone to see if this was appropriate. Transferred to team health for further evaluation

## 2021-04-01 NOTE — Telephone Encounter (Signed)
[facility] FYI:   ---Caller states she gained 5lbs of fluid weight over night. Noticing swelling in both legs and stomach. Mild difficulty breathing with exertion. No fever.  Advised to be seen within 4 hours; Patient will actually be going to ED

## 2021-04-03 ENCOUNTER — Other Ambulatory Visit: Payer: Self-pay

## 2021-04-05 ENCOUNTER — Encounter (HOSPITAL_COMMUNITY): Payer: Self-pay

## 2021-04-05 ENCOUNTER — Emergency Department (HOSPITAL_COMMUNITY): Payer: Medicare Other

## 2021-04-05 ENCOUNTER — Other Ambulatory Visit: Payer: Self-pay

## 2021-04-05 ENCOUNTER — Inpatient Hospital Stay (HOSPITAL_COMMUNITY)
Admission: EM | Admit: 2021-04-05 | Discharge: 2021-04-11 | DRG: 683 | Disposition: A | Discharge status: Home Health | Payer: Medicare Other | Attending: Family Medicine | Admitting: Family Medicine

## 2021-04-05 DIAGNOSIS — I2510 Atherosclerotic heart disease of native coronary artery without angina pectoris: Secondary | ICD-10-CM | POA: Diagnosis not present

## 2021-04-05 DIAGNOSIS — E861 Hypovolemia: Secondary | ICD-10-CM | POA: Diagnosis present

## 2021-04-05 DIAGNOSIS — F32A Depression, unspecified: Secondary | ICD-10-CM | POA: Diagnosis present

## 2021-04-05 DIAGNOSIS — N179 Acute kidney failure, unspecified: Principal | ICD-10-CM | POA: Diagnosis present

## 2021-04-05 DIAGNOSIS — Z7984 Long term (current) use of oral hypoglycemic drugs: Secondary | ICD-10-CM

## 2021-04-05 DIAGNOSIS — Z8249 Family history of ischemic heart disease and other diseases of the circulatory system: Secondary | ICD-10-CM

## 2021-04-05 DIAGNOSIS — Z8701 Personal history of pneumonia (recurrent): Secondary | ICD-10-CM

## 2021-04-05 DIAGNOSIS — F411 Generalized anxiety disorder: Secondary | ICD-10-CM | POA: Diagnosis present

## 2021-04-05 DIAGNOSIS — I5032 Chronic diastolic (congestive) heart failure: Secondary | ICD-10-CM | POA: Diagnosis not present

## 2021-04-05 DIAGNOSIS — E860 Dehydration: Secondary | ICD-10-CM | POA: Diagnosis not present

## 2021-04-05 DIAGNOSIS — E1151 Type 2 diabetes mellitus with diabetic peripheral angiopathy without gangrene: Secondary | ICD-10-CM | POA: Diagnosis present

## 2021-04-05 DIAGNOSIS — I110 Hypertensive heart disease with heart failure: Secondary | ICD-10-CM | POA: Diagnosis present

## 2021-04-05 DIAGNOSIS — Z888 Allergy status to other drugs, medicaments and biological substances status: Secondary | ICD-10-CM

## 2021-04-05 DIAGNOSIS — K5730 Diverticulosis of large intestine without perforation or abscess without bleeding: Secondary | ICD-10-CM | POA: Diagnosis present

## 2021-04-05 DIAGNOSIS — R609 Edema, unspecified: Secondary | ICD-10-CM | POA: Diagnosis not present

## 2021-04-05 DIAGNOSIS — Z6841 Body Mass Index (BMI) 40.0 and over, adult: Secondary | ICD-10-CM

## 2021-04-05 DIAGNOSIS — Z833 Family history of diabetes mellitus: Secondary | ICD-10-CM

## 2021-04-05 DIAGNOSIS — T502X5A Adverse effect of carbonic-anhydrase inhibitors, benzothiadiazides and other diuretics, initial encounter: Secondary | ICD-10-CM | POA: Diagnosis present

## 2021-04-05 DIAGNOSIS — G8929 Other chronic pain: Secondary | ICD-10-CM | POA: Diagnosis not present

## 2021-04-05 DIAGNOSIS — I5031 Acute diastolic (congestive) heart failure: Secondary | ICD-10-CM

## 2021-04-05 DIAGNOSIS — K219 Gastro-esophageal reflux disease without esophagitis: Secondary | ICD-10-CM | POA: Diagnosis present

## 2021-04-05 DIAGNOSIS — E871 Hypo-osmolality and hyponatremia: Secondary | ICD-10-CM | POA: Diagnosis not present

## 2021-04-05 DIAGNOSIS — I10 Essential (primary) hypertension: Secondary | ICD-10-CM | POA: Diagnosis present

## 2021-04-05 DIAGNOSIS — Z87891 Personal history of nicotine dependence: Secondary | ICD-10-CM

## 2021-04-05 DIAGNOSIS — Z79899 Other long term (current) drug therapy: Secondary | ICD-10-CM

## 2021-04-05 DIAGNOSIS — G2581 Restless legs syndrome: Secondary | ICD-10-CM | POA: Diagnosis not present

## 2021-04-05 DIAGNOSIS — G4733 Obstructive sleep apnea (adult) (pediatric): Secondary | ICD-10-CM | POA: Diagnosis not present

## 2021-04-05 DIAGNOSIS — Z20822 Contact with and (suspected) exposure to covid-19: Secondary | ICD-10-CM | POA: Diagnosis not present

## 2021-04-05 DIAGNOSIS — R0602 Shortness of breath: Secondary | ICD-10-CM | POA: Diagnosis not present

## 2021-04-05 DIAGNOSIS — I5030 Unspecified diastolic (congestive) heart failure: Secondary | ICD-10-CM | POA: Diagnosis present

## 2021-04-05 DIAGNOSIS — I872 Venous insufficiency (chronic) (peripheral): Secondary | ICD-10-CM | POA: Diagnosis not present

## 2021-04-05 DIAGNOSIS — K5900 Constipation, unspecified: Secondary | ICD-10-CM | POA: Diagnosis not present

## 2021-04-05 DIAGNOSIS — Z818 Family history of other mental and behavioral disorders: Secondary | ICD-10-CM

## 2021-04-05 DIAGNOSIS — Z885 Allergy status to narcotic agent status: Secondary | ICD-10-CM

## 2021-04-05 DIAGNOSIS — M7989 Other specified soft tissue disorders: Secondary | ICD-10-CM | POA: Diagnosis not present

## 2021-04-05 DIAGNOSIS — E876 Hypokalemia: Secondary | ICD-10-CM | POA: Diagnosis present

## 2021-04-05 DIAGNOSIS — Z743 Need for continuous supervision: Secondary | ICD-10-CM | POA: Diagnosis not present

## 2021-04-05 DIAGNOSIS — R600 Localized edema: Secondary | ICD-10-CM | POA: Diagnosis not present

## 2021-04-05 DIAGNOSIS — M797 Fibromyalgia: Secondary | ICD-10-CM | POA: Diagnosis present

## 2021-04-05 DIAGNOSIS — E785 Hyperlipidemia, unspecified: Secondary | ICD-10-CM | POA: Diagnosis present

## 2021-04-05 DIAGNOSIS — R6889 Other general symptoms and signs: Secondary | ICD-10-CM | POA: Diagnosis not present

## 2021-04-05 DIAGNOSIS — Z90710 Acquired absence of both cervix and uterus: Secondary | ICD-10-CM

## 2021-04-05 DIAGNOSIS — E739 Lactose intolerance, unspecified: Secondary | ICD-10-CM | POA: Diagnosis present

## 2021-04-05 DIAGNOSIS — Z880 Allergy status to penicillin: Secondary | ICD-10-CM

## 2021-04-05 DIAGNOSIS — Z91048 Other nonmedicinal substance allergy status: Secondary | ICD-10-CM

## 2021-04-05 DIAGNOSIS — Z91040 Latex allergy status: Secondary | ICD-10-CM

## 2021-04-05 DIAGNOSIS — M545 Low back pain, unspecified: Secondary | ICD-10-CM | POA: Diagnosis present

## 2021-04-05 DIAGNOSIS — N189 Chronic kidney disease, unspecified: Secondary | ICD-10-CM | POA: Diagnosis present

## 2021-04-05 HISTORY — DX: Type 2 diabetes mellitus without complications: E11.9

## 2021-04-05 HISTORY — DX: Heart failure, unspecified: I50.9

## 2021-04-05 LAB — BASIC METABOLIC PANEL
Anion gap: 14 (ref 5–15)
BUN: 24 mg/dL — ABNORMAL HIGH (ref 8–23)
CO2: 34 mmol/L — ABNORMAL HIGH (ref 22–32)
Calcium: 8.3 mg/dL — ABNORMAL LOW (ref 8.9–10.3)
Chloride: 81 mmol/L — ABNORMAL LOW (ref 98–111)
Creatinine, Ser: 1.41 mg/dL — ABNORMAL HIGH (ref 0.44–1.00)
GFR, Estimated: 42 mL/min — ABNORMAL LOW (ref >60)
Glucose, Bld: 132 mg/dL — ABNORMAL HIGH (ref 70–99)
Potassium: 2.8 mmol/L — ABNORMAL LOW (ref 3.5–5.1)
Sodium: 129 mmol/L — ABNORMAL LOW (ref 135–145)

## 2021-04-05 LAB — CBC
HCT: 35.9 % — ABNORMAL LOW (ref 36.0–46.0)
Hemoglobin: 11.9 g/dL — ABNORMAL LOW (ref 12.0–15.0)
MCH: 29.8 pg (ref 26.0–34.0)
MCHC: 33.1 g/dL (ref 30.0–36.0)
MCV: 89.8 fL (ref 80.0–100.0)
Platelets: 262 10*3/uL (ref 150–400)
RBC: 4 MIL/uL (ref 3.87–5.11)
RDW: 12.5 % (ref 11.5–15.5)
WBC: 9.7 10*3/uL (ref 4.0–10.5)
nRBC: 0 % (ref 0.0–0.2)

## 2021-04-05 LAB — TROPONIN I (HIGH SENSITIVITY): Troponin I (High Sensitivity): 6 ng/L (ref <18)

## 2021-04-05 LAB — BRAIN NATRIURETIC PEPTIDE: B Natriuretic Peptide: 13.2 pg/mL (ref 0.0–100.0)

## 2021-04-05 MED ORDER — ALBUTEROL SULFATE HFA 108 (90 BASE) MCG/ACT IN AERS
2.0000 | INHALATION_SPRAY | RESPIRATORY_TRACT | Status: DC | PRN
  Filled 2021-04-05: qty 6.7

## 2021-04-05 NOTE — ED Triage Notes (Signed)
Pt states that she took 80mg  of lasix PO and has only used the restroom 4 times today.

## 2021-04-05 NOTE — ED Triage Notes (Signed)
EMS reports is from home. C/O SOB that started several days ago. Worsening swelling in lower extremities and abdomen. History of CHF, has been taking furosemide as prescribed.  BP - 160, Nitro x1 given, # 22 in right hand, 125/70, HR - 90, O2 sat 98% RA, CBG - 157.

## 2021-04-05 NOTE — ED Triage Notes (Signed)
Emergency Medicine Provider Triage Evaluation Note  Maria [name] , a 65 y.o. female  was evaluated in triage.  Pt complains of increased leg swelling, shortness of breath.  Noticed this has worsened in the past week.  Swelling extends to my abdomen.  Has been taking Lasix 80 mg as prescribed without much improvement, does not feel that she is diuresing sufficiently.  No chest pain.  Review of Systems  Positive: Shortness of breath, leg swelling Negative: Chest pain  Physical Exam  BP (!) 121/103 (BP Location: Right Arm)   Pulse 86   Temp 98.4 F (36.9 C) (Oral)   Resp 18   Ht 5\' 5"  (1.651 m)   Wt (!) 166 kg   SpO2 97%   BMI 60.91 kg/m  Gen:   Awake, no distress   HEENT:  Atraumatic  Resp:  Normal effort  Cardiac:  Normal rate  Abd:   Nondistended, nontender  MSK:   Moves extremities without difficulty, pitting edema noted to bilateral lower extremities Neuro:  Speech clear   Medical Decision Making  Medically screening exam initiated at 8:17 PM.  Appropriate orders placed.  Maria [name] was informed that the remainder of the evaluation will be completed by another provider, this initial triage assessment does not replace that evaluation, and the importance of remaining in the ED until their evaluation is complete.  Clinical Impression  Obtain lab work and chest x-ray.   Delia Heady, PA-C 04/05/21 2018

## 2021-04-06 ENCOUNTER — Encounter (HOSPITAL_COMMUNITY): Payer: Self-pay | Admitting: Internal Medicine

## 2021-04-06 DIAGNOSIS — N189 Chronic kidney disease, unspecified: Secondary | ICD-10-CM | POA: Diagnosis present

## 2021-04-06 DIAGNOSIS — N179 Acute kidney failure, unspecified: Secondary | ICD-10-CM | POA: Diagnosis not present

## 2021-04-06 DIAGNOSIS — R609 Edema, unspecified: Secondary | ICD-10-CM | POA: Diagnosis present

## 2021-04-06 LAB — PHOSPHORUS: Phosphorus: 3.2 mg/dL (ref 2.5–4.6)

## 2021-04-06 LAB — GLUCOSE, CAPILLARY
Glucose-Capillary: 145 mg/dL — ABNORMAL HIGH (ref 70–99)
Glucose-Capillary: 112 mg/dL — ABNORMAL HIGH (ref 70–99)
Glucose-Capillary: 139 mg/dL — ABNORMAL HIGH (ref 70–99)

## 2021-04-06 LAB — MAGNESIUM: Magnesium: 1.7 mg/dL (ref 1.7–2.4)

## 2021-04-06 LAB — SARS CORONAVIRUS 2 (TAT 6-24 HRS): SARS Coronavirus 2: NEGATIVE

## 2021-04-06 MED ORDER — CYCLOBENZAPRINE HCL 10 MG PO TABS: 10.0000 mg | ORAL_TABLET | Freq: Two times a day (BID) | ORAL | Status: DC | PRN

## 2021-04-06 MED ORDER — POTASSIUM CHLORIDE 10 MEQ/100ML IV SOLN
10.0000 meq | Freq: Once | INTRAVENOUS | Status: AC
  Administered 2021-04-06: 10 meq via INTRAVENOUS
  Filled 2021-04-06: qty 100

## 2021-04-06 MED ORDER — ONDANSETRON HCL 4 MG/2ML IJ SOLN
4.0000 mg | Freq: Four times a day (QID) | INTRAMUSCULAR | Status: DC | PRN
  Administered 2021-04-10 – 2021-04-11 (×3): 4 mg via INTRAVENOUS
  Filled 2021-04-06 (×3): qty 2

## 2021-04-06 MED ORDER — FUROSEMIDE 10 MG/ML IJ SOLN
80.0000 mg | Freq: Once | INTRAMUSCULAR | Status: AC
  Administered 2021-04-06: 80 mg via INTRAVENOUS
  Filled 2021-04-06: qty 8

## 2021-04-06 MED ORDER — TRIAMCINOLONE 0.1 % CREAM:EUCERIN CREAM 1:1
TOPICAL_CREAM | Freq: Two times a day (BID) | CUTANEOUS | Status: DC
  Filled 2021-04-06 (×2): qty 1

## 2021-04-06 MED ORDER — SODIUM CHLORIDE 0.9% FLUSH
3.0000 mL | Freq: Two times a day (BID) | INTRAVENOUS | Status: DC
  Administered 2021-04-07 – 2021-04-11 (×8): 3 mL via INTRAVENOUS

## 2021-04-06 MED ORDER — FUROSEMIDE 40 MG PO TABS
40.0000 mg | ORAL_TABLET | Freq: Two times a day (BID) | ORAL | Status: DC
  Administered 2021-04-07 – 2021-04-08 (×4): 40 mg via ORAL
  Filled 2021-04-06 (×4): qty 1

## 2021-04-06 MED ORDER — ADULT MULTIVITAMIN W/MINERALS CH
1.0000 | ORAL_TABLET | Freq: Every day | ORAL | Status: DC
  Administered 2021-04-06 – 2021-04-11 (×6): 1 via ORAL
  Filled 2021-04-06 (×6): qty 1

## 2021-04-06 MED ORDER — HYDRALAZINE HCL 20 MG/ML IJ SOLN: 5.0000 mg | INTRAMUSCULAR | Status: DC | PRN

## 2021-04-06 MED ORDER — LOSARTAN POTASSIUM 50 MG PO TABS
100.0000 mg | ORAL_TABLET | Freq: Every day | ORAL | Status: DC
  Administered 2021-04-07 – 2021-04-11 (×5): 100 mg via ORAL
  Filled 2021-04-06 (×5): qty 2

## 2021-04-06 MED ORDER — PROSOURCE PLUS PO LIQD
30.0000 mL | Freq: Two times a day (BID) | ORAL | Status: DC
  Administered 2021-04-06 – 2021-04-11 (×10): 30 mL via ORAL
  Filled 2021-04-06 (×9): qty 30

## 2021-04-06 MED ORDER — INSULIN ASPART 100 UNIT/ML ~~LOC~~ SOLN: 0.0000 [IU] | Freq: Every day | SUBCUTANEOUS | Status: DC

## 2021-04-06 MED ORDER — ROPINIROLE HCL 1 MG PO TABS
3.0000 mg | ORAL_TABLET | Freq: Every evening | ORAL | Status: DC | PRN
  Administered 2021-04-06 – 2021-04-10 (×3): 3 mg via ORAL
  Filled 2021-04-06 (×4): qty 3

## 2021-04-06 MED ORDER — ACETAMINOPHEN 325 MG PO TABS
650.0000 mg | ORAL_TABLET | Freq: Four times a day (QID) | ORAL | Status: DC | PRN
  Administered 2021-04-06: 650 mg via ORAL
  Filled 2021-04-06: qty 2

## 2021-04-06 MED ORDER — LACTATED RINGERS IV SOLN: INTRAVENOUS | Status: AC

## 2021-04-06 MED ORDER — ENOXAPARIN SODIUM 80 MG/0.8ML ~~LOC~~ SOLN
80.0000 mg | SUBCUTANEOUS | Status: DC
  Administered 2021-04-06 – 2021-04-10 (×5): 80 mg via SUBCUTANEOUS
  Filled 2021-04-06 (×6): qty 0.8

## 2021-04-06 MED ORDER — POLYETHYLENE GLYCOL 3350 17 G PO PACK
17.0000 g | PACK | Freq: Every day | ORAL | Status: DC | PRN
  Administered 2021-04-08 – 2021-04-10 (×2): 17 g via ORAL
  Filled 2021-04-06 (×2): qty 1

## 2021-04-06 MED ORDER — BISACODYL 5 MG PO TBEC
5.0000 mg | DELAYED_RELEASE_TABLET | Freq: Every day | ORAL | Status: DC | PRN
  Administered 2021-04-09 – 2021-04-10 (×2): 5 mg via ORAL
  Filled 2021-04-06 (×2): qty 1

## 2021-04-06 MED ORDER — ACETAMINOPHEN 650 MG RE SUPP: 650.0000 mg | Freq: Four times a day (QID) | RECTAL | Status: DC | PRN

## 2021-04-06 MED ORDER — DOCUSATE SODIUM 100 MG PO CAPS
100.0000 mg | ORAL_CAPSULE | Freq: Two times a day (BID) | ORAL | Status: DC
  Administered 2021-04-06 – 2021-04-11 (×10): 100 mg via ORAL
  Filled 2021-04-06 (×10): qty 1

## 2021-04-06 MED ORDER — CLONAZEPAM 0.5 MG PO TABS
1.0000 mg | ORAL_TABLET | Freq: Two times a day (BID) | ORAL | Status: DC | PRN
  Administered 2021-04-06 – 2021-04-07 (×3): 1 mg via ORAL
  Filled 2021-04-06 (×3): qty 2

## 2021-04-06 MED ORDER — POTASSIUM CHLORIDE CRYS ER 20 MEQ PO TBCR
40.0000 meq | EXTENDED_RELEASE_TABLET | Freq: Once | ORAL | Status: AC
  Administered 2021-04-06: 40 meq via ORAL
  Filled 2021-04-06: qty 2

## 2021-04-06 MED ORDER — INSULIN ASPART 100 UNIT/ML ~~LOC~~ SOLN
0.0000 [IU] | Freq: Three times a day (TID) | SUBCUTANEOUS | Status: DC
  Administered 2021-04-06 – 2021-04-09 (×2): 2 [IU] via SUBCUTANEOUS

## 2021-04-06 MED ORDER — MORPHINE SULFATE (PF) 4 MG/ML IV SOLN
4.0000 mg | Freq: Once | INTRAVENOUS | Status: DC
Start: 2021-04-06 — End: 2021-04-11
  Filled 2021-04-06 (×3): qty 1

## 2021-04-06 MED ORDER — PANTOPRAZOLE SODIUM 40 MG PO TBEC
40.0000 mg | DELAYED_RELEASE_TABLET | Freq: Every day | ORAL | Status: DC
  Administered 2021-04-06 – 2021-04-11 (×6): 40 mg via ORAL
  Filled 2021-04-06 (×6): qty 1

## 2021-04-06 MED ORDER — ONDANSETRON HCL 4 MG PO TABS: 4.0000 mg | ORAL_TABLET | Freq: Four times a day (QID) | ORAL | Status: DC | PRN

## 2021-04-06 MED ORDER — OXYCODONE HCL 5 MG PO TABS
10.0000 mg | ORAL_TABLET | ORAL | Status: DC | PRN
  Administered 2021-04-06 – 2021-04-11 (×11): 10 mg via ORAL
  Filled 2021-04-06 (×11): qty 2

## 2021-04-06 NOTE — H&P (Addendum)
History and Physical    Maria [name] AQT:[id number] DOB: 03/21/1956 DOA: 04/05/2021  PCP: Biagio Borg, MD Consultants:  Havery Moros - GI; cardiology appt on 5/24 Patient coming from:  Home - lives alone; NOK: Daughter, [employer], [place]  Chief Complaint: SOB  HPI: Maria [name] is a 65 y.o. female with medical history significant of chronic low back pain; HTN; HLD; DM; PVD; and chronic diastolic CHF presenting with SOB.  She was seen for the same - weight gain, SOB - on 4/12 and was discharged with increased Lasix dose.  For a while now she has had LE edema and retaining fluid in her abdomen.  It was a rough night last night, sitting in the waiting room all night.  Mild SOB, mild cough for a couple of days.  She sleeps in a recliner.  Her weight has been fluctuating - she is trying to eat better and gets Meal on Wheels.     ED Course:  CHF exacerbation - diagnosed a little over a month ago.  Started on diuretics, having anasarca and SOB.  Lungs clear, moderate swelling.  Electrolytes abnormal.  Review of Systems: As per HPI; otherwise review of systems reviewed and negative.   Ambulatory Status:  Ambulates with a cane  COVID Vaccine Status:  Complete  Past Medical History:  Diagnosis Date  . Acute lymphadenitis 2011  . ALLERGIC RHINITIS 08/10/2007  . ANXIETY 12/06/2007  . Atherosclerotic peripheral vascular disease ([place]) 06/13/2013   Aorta on CT June 2014  . Cellulitis 2011  . Cervical disc disease 03/09/2012  . CHF (congestive heart failure) ([place])   . Chronic pain 03/09/2012  . DEPRESSION 12/06/2007  . Diabetes ([place])   . DIVERTICULOSIS, [place] 12/06/2007  . GERD 12/06/2007  . Hepatitis    age 78 hepatitis A  . HLD (hyperlipidemia) 05/24/2019  . HYPERTENSION 12/06/2007  . Lumbar disc disease 03/09/2012  . Mesenteric adenitis   . MRSA 2006  . Pain in joint, multiple sites 04/21/2010  . Pneumonia sept 2015   walking pneumonia, sepsis   . 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 THORACIC/LUMBOSACRAL NEURITIS/RADICULITIS UNSPEC 12/28/2008    Past Surgical History:  Procedure Laterality Date  . ABDOMINAL HYSTERECTOMY  1999   1 ovary left  . bloo clot removed from neck   may 25th , june 2. 2010  . [place] SURGERY  may 24th 2010  . COLONOSCOPY WITH PROPOFOL N/A 07/10/2016   Procedure: COLONOSCOPY WITH PROPOFOL;  Surgeon: Manus Gunning, MD;  Location: [facility];  Service: Gastroenterology;  Laterality: N/A;  . COLONOSCOPY WITH PROPOFOL N/A 09/26/2019   Procedure: COLONOSCOPY WITH PROPOFOL;  Surgeon: Yetta Flock, MD;  Location: [facility];  Service: Gastroenterology;  Laterality: N/A;  . ESOPHAGOGASTRODUODENOSCOPY (EGD) WITH PROPOFOL N/A 07/10/2016   Procedure: ESOPHAGOGASTRODUODENOSCOPY (EGD) WITH PROPOFOL;  Surgeon: Manus Gunning, MD;  Location: [facility];  Service: Gastroenterology;  Laterality: N/A;  . POLYPECTOMY  09/26/2019   Procedure: POLYPECTOMY;  Surgeon: Yetta Flock, MD;  Location: [facility];  Service: Gastroenterology;;  . s/p ovary cyst    . s/p right knee arthroscopy     Dr. Mardelle Matte ortho    Social History   Socioeconomic History  . Marital status: Divorced    Spouse name: Not on file  . Number of children: 2  . Years of education: Not on file  . Highest education level: Not on file  Occupational History  . Occupation: disabled  Tobacco Use  . Smoking status:  Former Smoker    Years: 30.00    Quit date: 11/08/2008    Years since quitting: 12.4  . Smokeless tobacco: Never Used  . Tobacco comment: quit 10/09  Vaping Use  . Vaping Use: Never used  Substance and Sexual Activity  . Alcohol use: No    Comment: quit drinking 10/07/1997  . Drug use: No  . Sexual activity: Not Currently    Birth control/protection: Surgical, Abstinence  Other Topics Concern  . Not on file  Social History Narrative   Patient has difficulty financially affording food, medication, and affording  essential bills. She states she has limited support from her daughter.    Social Determinants of Health   Financial Resource Strain: Medium Risk  . Difficulty of Paying Living Expenses: Somewhat hard  Food Insecurity: Food Insecurity Present  . Worried About Charity fundraiser in the Last Year: Sometimes true  . Ran Out of Food in the Last Year: Sometimes true  Transportation Needs: No Transportation Needs  . Lack of Transportation (Medical): No  . Lack of Transportation (Non-Medical): No  Physical Activity: Inactive  . Days of Exercise per Week: 0 days  . Minutes of Exercise per Session: 0 min  Stress: No Stress Concern Present  . Feeling of Stress : Not at all  Social Connections: Unknown  . Frequency of Communication with Friends and Family: More than three times a week  . Frequency of Social Gatherings with Friends and Family: More than three times a week  . Attends Religious Services: Patient refused  . Active Member of Clubs or Organizations: Patient refused  . Attends Archivist Meetings: Patient refused  . Marital Status: Never married  Intimate Partner Violence: Not At Risk  . Fear of Current or Ex-Partner: No  . Emotionally Abused: No  . Physically Abused: No  . Sexually Abused: No    Allergies  Allergen Reactions  . Amoxicillin-Pot Clavulanate Nausea And Vomiting    Projectile vomiting Did it involve swelling of the face/tongue/throat, SOB, or low BP? No Did it involve sudden or severe rash/hives, skin peeling, or any reaction on the inside of your mouth or nose? No Did you need to seek medical attention at a hospital or doctor's office? No When did it last happen?20-30 years If all above answers are "NO", may proceed with cephalosporin use.   . Adhesive [Tape] Other (See Comments)    Tears skin off - use paper tape   . Codeine Other (See Comments)    hallucinations  . Crestor [Rosuvastatin Calcium]     Severe muscle cramps  . Lactose  Intolerance (Gi) Nausea And Vomiting  . Morphine And Related Nausea And Vomiting and Other (See Comments)    Migraine headaches  . Vicodin [Hydrocodone-Acetaminophen] Other (See Comments)    hallucinations  . Latex Rash    Family History  Problem Relation Age of Onset  . Heart disease Mother   . Hypertension Mother   . Diabetes Mother   . Heart failure Mother   . Asthma Sister   . Anxiety disorder Sister   . Depression Sister   . Hypertension Father   . Asthma Daughter   . Bipolar disorder Daughter   . Cancer Maternal Uncle        [place]  . Cancer Other        ovarian  . Liver cancer Paternal Grandmother        ????    Prior to Admission medications   Medication  Sig Start Date End Date Taking? Authorizing Provider  acetaminophen (TYLENOL) 325 MG tablet Take 650 mg by mouth every 6 (six) hours as needed for headache (pain).   Yes [provider]  albuterol (PROAIR HFA) 108 (90 Base) MCG/ACT inhaler Inhale 2 puffs into the lungs every 6 (six) hours as needed for wheezing or shortness of breath. 11/05/20  Yes Colin Benton R, DO  cephALEXin (KEFLEX) 500 MG capsule Take 500 mg by mouth See admin instructions. Tid x 10 days 04/03/21  Yes [provider]  clonazePAM (KLONOPIN) 1 MG tablet TAKE 1 TABLET BY MOUTH 2 TIMES A DAY AS NEEDED FOR ANXIETY Patient taking differently: Take 1 mg by mouth 2 (two) times daily as needed for anxiety. 12/10/20  Yes Biagio Borg, MD  cyclobenzaprine (FLEXERIL) 10 MG tablet TAKE 1 TABLET BY MOUTH 2 TIMES A DAY AS NEEDED FOR MUSCLE SPASMS Patient taking differently: Take 10 mg by mouth 2 (two) times daily as needed for muscle spasms. 02/27/21  Yes Biagio Borg, MD  furosemide (LASIX) 20 MG tablet Take 3 tablets (60 mg total) by mouth 2 (two) times daily. Patient taking differently: Take 40-80 mg by mouth See admin instructions. 80 mg in the morning. 40 mg in the evening. 03/11/21  Yes Bonnielee Haff, MD  LINZESS 145 MCG CAPS capsule Take  145 mcg by mouth daily as needed (constipation). 08/15/20  Yes [provider]  loperamide (IMODIUM) 2 MG capsule Take 2 mg by mouth as needed for diarrhea or loose stools.   Yes [provider]  losartan (COZAAR) 100 MG tablet TAKE 1 TABLET BY MOUTH DAILY Patient taking differently: Take 100 mg by mouth daily. 08/06/20  Yes Biagio Borg, MD  metFORMIN (GLUCOPHAGE) 500 MG tablet Take 1 tablet (500 mg total) by mouth 2 (two) times daily with a meal. 03/11/21 05/10/21 Yes Bonnielee Haff, MD  metolazone (ZAROXOLYN) 2.5 MG tablet Take 2.5 mg by mouth daily as needed (fluid). 06/11/20  Yes [provider]  NARCAN 4 MG/0.1ML LIQD nasal spray kit Place 0.4 mg into the nose daily as needed (opioid overdose). 04/25/19  Yes [provider]  ondansetron (ZOFRAN ODT) 4 MG disintegrating tablet Take 1 tablet (4 mg total) by mouth every 4 (four) hours as needed for nausea or vomiting. 08/23/19  Yes Charlesetta Shanks, MD  Oxycodone HCl 10 MG TABS Take 1 tablet (10 mg total) 4 (four) times daily by mouth. Per [facility] Patient taking differently: Take 10 mg by mouth every 4 (four) hours as needed (pain). Per [facility] 11/08/17  Yes Biagio Borg, MD  pantoprazole (PROTONIX) 40 MG tablet TAKE ONE TABLET BY MOUTH DAILY Patient taking differently: Take 40 mg by mouth daily. 03/03/21  Yes Biagio Borg, MD  potassium chloride (MICRO-K) 10 MEQ CR capsule Take 2 capsules (20 mEq total) by mouth daily. Patient taking differently: Take 30 mEq by mouth daily. 03/11/21  Yes Bonnielee Haff, MD  rOPINIRole (REQUIP) 3 MG tablet TAKE 1 TABLET BY MOUTH NIGHTLY AT BEDTIME AS NEEDED Patient taking differently: Take 3 mg by mouth at bedtime as needed (RLS). 02/04/21  Yes Biagio Borg, MD  Simethicone (GAS-X EXTRA STRENGTH PO) Take 1 tablet by mouth daily as needed (gas).   Yes [provider]  Tetrahydrozoline HCl (VISINE OP) Place 1 drop into both eyes daily as needed (dry  eyes).   Yes [provider]  DULoxetine (CYMBALTA) 60 MG capsule Take 60 mg by  mouth daily.    03/09/12  [provider]  fexofenadine (ALLEGRA) 180 MG tablet Take 180 mg by mouth daily.    03/09/12  [provider]    Physical Exam: Vitals:   04/05/21 2339 04/06/21 0456 04/06/21 0606 04/06/21 0945  BP: 130/66 123/66 (!) 125/50 (!) 135/59  Pulse: 77 78 74 84  Resp: '18 18 20 18  ' Temp:  98 F (36.7 C)    TempSrc:      SpO2: 100% 98% 100% 97%  Weight:      Height:         . General:  Appears calm and comfortable and is in NAD, very obese and appears sedentary . Eyes:  PERRL, EOMI, normal lids, iris . ENT:  grossly normal hearing, lips & tongue, mmm . Neck:  no LAD, masses or thyromegaly . Cardiovascular:  RRR, no m/r/g. Nonpitting LE edema which may be related to habitus and sedentary lifestyle. Marland Kitchen Respiratory:   CTA bilaterally with no wheezes/rales/rhonchi.  Normal respiratory effort. . Abdomen:  soft, NT, ND . Skin:  BLE stasis dermatitis . Musculoskeletal:  grossly normal tone BUE/BLE, good ROM, no bony abnormality . Psychiatric:  grossly normal mood and affect, speech fluent and appropriate, AOx3 . Neurologic:  CN 2-12 grossly intact, moves all extremities in coordinated fashion    Radiological Exams on Admission: Independently reviewed - see discussion in A/P where applicable  DG Chest 2 View  Result Date: 04/05/2021 CLINICAL DATA:  Shortness of breath EXAM: CHEST - 2 VIEW COMPARISON:  04/01/2021 FINDINGS: Heart is normal size. No confluent airspace opacities or effusions. No acute bony abnormality. IMPRESSION: No active cardiopulmonary disease. Electronically Signed   By: Rolm Baptise M.D.   On: 04/05/2021 20:43    EKG: Independently reviewed.  NSR with rate 83; nonspecific ST changes with NSCSLT   Labs on Admission: I have personally reviewed the available labs and imaging studies at the time of the admission.  Pertinent labs:   Na++ 129;  135 on 4/12 K+ 2.8 CO2 34 BUN 24/Creatinine 1.41/GFR 42; 23/1.11/56 on 4/12; 29/1.01/59 on 3/28 BNP 13.2 HS troponin 6 WBC 9.7 Hgb 11.9   Assessment/Plan Principal Problem:   AKI (acute kidney injury) ([place]) Active Problems:   Benign hypertension   Chronic low back pain   Morbid obesity (HCC)   Venous stasis dermatitis of both lower extremities   HLD (hyperlipidemia)   Diastolic congestive heart failure (HCC)   Edema   AKI -Patient with reported recent diagnoses of diastolic CHF (not new by chart review) -She is complaining of worsening edema and so has had dose escalation of Lasix -She now appears to be mildly overdiuresed - so willgive small amount of fluid back -Can resume Lasix tomorrow -Will observe overnight on telemetry  Chronic diastolic CHF -Patient with diastolic CHF based on grade 1 diastolic dysfunction on 2/54/27 echo (also present in 01/2018) -Mostly needs education, compression stockings, nutrition consult -Outpatient f/u with Dr. Margaretann Loveless on 4/25 - her nurse, Eliezer Lofts, will contact the patient to arrange -Her worsening edema is likely related to body habitus and the cycle of decreasing activity leading to worsening SOB/deconditiong and then edema -Continue Lasix tomorrow but decrease to 40 mg BID and hold Zaroxolyn  Stasis dermatitis -In addition to compression stockings, will add Eucerin with TAC  -As noted above, increased mobility is the key here -She currently has a step goal of 2700 steps/day; would encourage her to push that up slowly over time -Keflex recently started,  but she does not appear to have cellulitis at this time so will stop  HTN -Continue Cozaar (resume tomorrow)  Chronic pain -I have reviewed this patient in the [place] Controlled Substances Reporting System.  She is receiving medications from two providers but appears to be taking them as prescribed. -She is at very high risk of opioid misuse, diversion, or overdose. -Continue Klonopin,  Flexeril, Oxycodone, Requip  HLD -She does not appear to be taking medications for this issue at this time  DM -Recent A1c was 6.5, indicating reasonable control -hold Glucophage -Cover with moderate-scale SSI  Obesity -Body mass index is 60.91 kg/m..  -Weight loss should be encouraged -Mobility for her is key or she will lose her ability to mobilize soon -Outpatient PCP/bariatric medicine f/u encouraged      Note: This patient has been tested and is pending for the novel coronavirus COVID-19. The patient has been fully vaccinated against COVID-19.   Level of care: Telemetry CardiacCardiac Telemetry DVT prophylaxis:  Lovenox  Code Status:  Full - confirmed with patient Family Communication: None present Disposition Plan:  The patient is from: home  Anticipated d/c is to: home, possibly with [facility] services   Anticipated d/c date will depend on clinical response to treatment, but possibly as early as tomorrow if she has excellent response to treatment  Patient is currently: acutely ill Consults called: PT/OT/TOC team  Admission status:  It is my clinical opinion that referral for OBSERVATION is reasonable and necessary in this patient based on the above information provided. The aforementioned taken together are felt to place the patient at high risk for further clinical deterioration. However it is anticipated that the patient may be medically stable for discharge from the hospital within 24 to 48 hours.    Karmen Bongo MD [facility]   How to contact the [facility] Attending or Consulting provider [place] or covering provider during after hours [place], for this patient?  1. Check the care team in [facility] and look for a) attending/consulting [facility] provider listed and b) the [facility] team listed 2. Log into [email or web address] and use [place]'s universal password to access. If you do not have the password, please contact the hospital operator. 3. Locate the [facility] provider you are looking  for under [facility] and page to a number that you can be directly reached. 4. If you still have difficulty reaching the provider, please page the [facility] (Director on Call) for the Hospitalists listed on amion for assistance.   04/06/2021, 5:28 PM

## 2021-04-06 NOTE — Discharge Instructions (Signed)

## 2021-04-06 NOTE — ED Notes (Signed)
IV team RN at bedside.  

## 2021-04-06 NOTE — ED Notes (Signed)
Pt ambulatory to restroom with cane.

## 2021-04-06 NOTE — Progress Notes (Addendum)
Initial Nutrition Assessment  DOCUMENTATION CODES:   Morbid obesity  INTERVENTION:   -Double protein portions with meals -30 ml Prosource Plus BID, each supplement provides 100 kcals and 15 grams protein -MVI with minerals daily -RD provided "Low Sodium Nutrition Therapy" handout from [facility] Nutrition Care Manual; attached to AVS/ discharge summary  NUTRITION DIAGNOSIS:   Increased nutrient needs related to chronic illness (CHF) as evidenced by estimated needs.  GOAL:   Patient will meet greater than or equal to 90% of their needs  MONITOR:   PO intake,Supplement acceptance,Labs,Weight trends,Skin,I & O's  REASON FOR ASSESSMENT:   Consult Assessment of nutrition requirement/status  ASSESSMENT:   Maria [name] , a 65 y.o. female  was evaluated in triage.  Pt complains of increased leg swelling, shortness of breath.  Noticed this has worsened in the past week.  Swelling extends to my abdomen.  Has been taking Lasix 80 mg as prescribed without much improvement, does not feel that she is diuresing sufficiently.  Pt admitted with CHF exacerbation.  Reviewed I/O's: -1.1 L x 24 hours  UOP: 1.1 L x 24 hours  Pt unavailable at time of visit. Attempted to speak with pt via call to hospital room phone, however, unable to reach.  No meal completion data to assess at this time. Per ED notes, pt is trying to make diet modifications, but still struggles with fluid overload.    Reviewed wt hx; wt has been stable over the past 10 months.   Obesity is a complex, chronic medical condition that is optimally managed by a multidisciplinary care team. Weight loss is not an ideal goal for an acute inpatient hospitalization. However, if further work-up for obesity is warranted, consider outpatient referral to outpatient bariatric service and/or [place].    Medications reviewed and include colace, lasix, and lactated ringers infusion @ 100 ml/hr.    Lab Results  Component Value Date   HGBA1C 6.5 (H) 03/07/2021   PTA DM medications are 500 mg metformin BID.   Labs reviewed: Na: 129, K: 2.8, CBGS: 145 (inpatient orders for glycemic control are 0-15 units insulin aspart TID with meals and 0-5 units insulin aspart daily at bedtime).   Diet Order:   Diet Order            Diet Carb Modified Fluid consistency: Thin; Room service appropriate? Yes; Fluid restriction: 1800 mL Fluid  Diet effective now                 EDUCATION NEEDS:   No education needs have been identified at this time  Skin:  Skin Assessment: Reviewed RN Assessment  Last BM:  Unknown  Height:   Ht Readings from Last 1 Encounters:  04/05/21 5\' 5"  (1.651 m)    Weight:   Wt Readings from Last 1 Encounters:  04/05/21 (!) 166 kg    Ideal Body Weight:  56.8 kg  BMI:  Body mass index is 60.91 kg/m.  Estimated Nutritional Needs:   Kcal:  1800-2000  Protein:  125-140 grams  Fluid:  > 1.8 L    Loistine Chance, RD, LDN, [place] Registered Dietitian II Certified Diabetes Care and Education Specialist Please refer to [facility] for RD and/or RD on-call/weekend/after hours pager

## 2021-04-06 NOTE — ED Provider Notes (Signed)
 [facility] EMERGENCY DEPARTMENT Provider Note   CSN: [id number] Arrival date & time: 04/05/21  1953     History Chief Complaint  Patient presents with  . Shortness of Breath  . Leg Swelling    Maria [name] is a 65 y.o. female.  The history is provided by the patient and medical records. No language interpreter was used.  Shortness of Breath    65-year-old female significant history of CHF, diabetes, hypertension, hyperlipidemia who presents with complaints of shortness of breath.  Patient states she was diagnosed with CHF little over a month ago.  She has been started on several new medications to help with her symptoms.  She mention she is continue to retain fluid complaints of swelling to her extremities extending towards the abdomen, she endorsed having increased shortness of breath with exertion and with lying flat.  She states despite taking her increased Lasix dose for the past 3 days she is urinating last.  She endorsed severe muscle cramping,.  She mention having increased shortness of breath even with walking short distances using her cane.  She endorsed cough, headache, and fatigue.  She does not complain of any fever chest pain chills cough congestion recent sick contact.  She denies any recent travel, prolonged bedrest, active cancer, recent surgery.  She mention her next cardiology appointment is a month from now.   Past Medical History:  Diagnosis Date  . Acute lymphadenitis 2011  . ALLERGIC RHINITIS 08/10/2007  . ANXIETY 12/06/2007  . Atherosclerotic peripheral vascular disease (HCC) 06/13/2013   Aorta on CT June 2014  . BACK PAIN 12/06/2007  . Cellulitis 2011  . Cervical disc disease 03/09/2012  . CHF (congestive heart failure) (HCC)   . Chronic pain 03/09/2012  . DEPRESSION 12/06/2007  . Diabetes (HCC)   . DIVERTICULOSIS, [place] 12/06/2007  . GERD 12/06/2007  . Hepatitis    age 6 hepatitis A  . HLD (hyperlipidemia) 05/24/2019  . HYPERTENSION  12/06/2007  . Impaired glucose tolerance 03/13/2012  . LOW BACK PAIN 12/06/2007  . Lumbar disc disease 03/09/2012  . Mesenteric adenitis   . MRSA 2006  . Pain    by breast area  . Pain in joint, multiple sites 04/21/2010  . PARONYCHIA, FINGER 08/13/2009  . Pneumonia sept 2015   walking pneumonia, sepsis   . RASH-NONVESICULAR 05/09/2008  . 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 SHOULDER PAIN, LEFT 05/09/2008  . SINUSITIS- ACUTE-NOS 05/09/2008  . THORACIC/LUMBOSACRAL NEURITIS/RADICULITIS UNSPEC 12/28/2008    Patient Active Problem List   Diagnosis Date Noted  . Diastolic congestive heart failure (HCC) 03/17/2021  . Diabetes mellitus type 2 in obese (HCC) 03/11/2021  . Acute diastolic CHF (congestive heart failure) (HCC) 03/07/2021  . Acute on chronic diastolic (congestive) heart failure (HCC) 03/06/2021  . Vitamin D deficiency 01/01/2021  . Medial epicondylitis 12/31/2020  . Aortic atherosclerosis (HCC) 09/26/2020  . Umbilical hernia 09/26/2020  . Constipation 08/05/2020  . Arthritis 06/12/2020  . Hoarseness 05/01/2020  . Vitamin B 12 deficiency 01/31/2020  . Rosacea 01/31/2020  . Neck swelling 01/31/2020  . History of colonic polyps   . Benign neoplasm of [place]   . Nausea 08/31/2019  . Throat pain 07/26/2019  . HLD (hyperlipidemia) 05/24/2019  . Leg cramps 05/24/2019  . Lump in neck 05/24/2019  . Hyperglycemia 01/25/2019  . Left thyroid nodule 01/25/2019  . Jerking movements of extremities 07/20/2018  . Balance disorder 07/20/2018  . Right knee pain 03/21/2018  . OSA (obstructive sleep apnea)   03/21/2018  . Oxygen desaturation 03/21/2018  . Urinary symptom or sign 03/21/2018  . Acquired lymphedema 01/25/2018  . Gait disorder 01/25/2018  . Peripheral edema 01/16/2018  . SOB (shortness of breath) 01/15/2018  . Dysphonia 12/07/2017  . Encounter for well adult exam with abnormal findings 11/08/2017  . 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[place]) 05/31/2017  . Pensyl polyp 08/11/2016  .  Abdominal pain, epigastric   . Dysphagia   . [place] cancer screening   . Prediabetes 07/01/2016  . ACE-inhibitor cough 12/05/2015  . Restless legs syndrome 10/08/2015  . Venous stasis dermatitis of both lower extremities 04/26/2015  . 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 AR (allergic rhinitis) 10/17/2014  . Fatty liver 10/17/2014  . Chronic pain syndrome 09/19/2014  . Post-traumatic osteoarthritis of both knees 09/19/2014  . Spinal stenosis 09/19/2014  . Depression, major, single episode, complete remission ([place]) 09/19/2014  . Narcotic dependence ([place]) 09/19/2014  . Spondylosis of 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 03/09/2012  . Lumbar disc disease 03/09/2012  . Chronic pain 03/09/2012  . Morbid obesity ([place]) 06/17/2011  . Chronic low back pain 12/28/2008  . Anxiety state 12/06/2007  . Depression 12/06/2007  . Benign hypertension 12/06/2007  . Gastroesophageal reflux disease without esophagitis 12/06/2007  . LOW BACK PAIN 12/06/2007    Past Surgical History:  Procedure Laterality Date  . ABDOMINAL HYSTERECTOMY  1999   1 ovary left  . bloo clot removed from neck   may 25th , june 2. 2010  . [place] SURGERY  may 24th 2010  . COLONOSCOPY WITH PROPOFOL N/A 07/10/2016   Procedure: COLONOSCOPY WITH PROPOFOL;  Surgeon: Manus Gunning, MD;  Location: [facility];  Service: Gastroenterology;  Laterality: N/A;  . COLONOSCOPY WITH PROPOFOL N/A 09/26/2019   Procedure: COLONOSCOPY WITH PROPOFOL;  Surgeon: Yetta Flock, MD;  Location: [facility];  Service: Gastroenterology;  Laterality: N/A;  . ESOPHAGOGASTRODUODENOSCOPY (EGD) WITH PROPOFOL N/A 07/10/2016   Procedure: ESOPHAGOGASTRODUODENOSCOPY (EGD) WITH PROPOFOL;  Surgeon: Manus Gunning, MD;  Location: [facility];  Service: Gastroenterology;  Laterality: N/A;  . POLYPECTOMY  09/26/2019   Procedure: POLYPECTOMY;  Surgeon: Yetta Flock, MD;  Location: [facility];  Service:  Gastroenterology;;  . s/p ovary cyst    . s/p right knee arthroscopy     Dr. Mardelle Matte ortho     OB History   No obstetric history on file.     Family History  Problem Relation Age of Onset  . Heart disease Mother   . Hypertension Mother   . Diabetes Mother   . Heart failure Mother   . Asthma Sister   . Anxiety disorder Sister   . Depression Sister   . Hypertension Father   . Asthma Daughter   . Bipolar disorder Daughter   . Cancer Maternal Uncle        [place]  . Cancer Other        ovarian  . Liver cancer Paternal Grandmother        ????    Social History   Tobacco Use  . Smoking status: Former Smoker    Years: 30.00    Quit date: 11/08/2008    Years since quitting: 12.4  . Smokeless tobacco: Never Used  . Tobacco comment: quit 10/09  Vaping Use  . Vaping Use: Never used  Substance Use Topics  . Alcohol use: No    Comment: quit drinking 10/07/1997  . Drug use: No    Home Medications Prior to Admission medications  Medication Sig Start Date End Date Taking? Authorizing Provider  acetaminophen (TYLENOL) 325 MG tablet Take 650 mg by mouth every 6 (six) hours as needed for headache (pain).   Yes [provider]  albuterol (PROAIR HFA) 108 (90 Base) MCG/ACT inhaler Inhale 2 puffs into the lungs every 6 (six) hours as needed for wheezing or shortness of breath. 11/05/20  Yes Kim, Hannah R, DO  cephALEXin (KEFLEX) 500 MG capsule Take 500 mg by mouth See admin instructions. Tid x 10 days 04/03/21  Yes [provider]  clonazePAM (KLONOPIN) 1 MG tablet TAKE 1 TABLET BY MOUTH 2 TIMES A DAY AS NEEDED FOR ANXIETY Patient taking differently: Take 1 mg by mouth 2 (two) times daily as needed for anxiety. 12/10/20  Yes [name], James W, MD  cyclobenzaprine (FLEXERIL) 10 MG tablet TAKE 1 TABLET BY MOUTH 2 TIMES A DAY AS NEEDED FOR MUSCLE SPASMS Patient taking differently: Take 10 mg by mouth 2 (two) times daily as needed for muscle spasms. 02/27/21  Yes [name], James  W, MD  furosemide (LASIX) 20 MG tablet Take 3 tablets (60 mg total) by mouth 2 (two) times daily. Patient taking differently: Take 40-80 mg by mouth See admin instructions. 80 mg in the morning. 40 mg in the evening. 03/11/21  Yes Krishnan, Gokul, MD  LINZESS 145 MCG CAPS capsule Take 145 mcg by mouth daily as needed (constipation). 08/15/20  Yes [provider]  loperamide (IMODIUM) 2 MG capsule Take 2 mg by mouth as needed for diarrhea or loose stools.   Yes [provider]  losartan (COZAAR) 100 MG tablet TAKE 1 TABLET BY MOUTH DAILY Patient taking differently: Take 100 mg by mouth daily. 08/06/20  Yes [name], James W, MD  metFORMIN (GLUCOPHAGE) 500 MG tablet Take 1 tablet (500 mg total) by mouth 2 (two) times daily with a meal. 03/11/21 05/10/21 Yes Krishnan, Gokul, MD  metolazone (ZAROXOLYN) 2.5 MG tablet Take 2.5 mg by mouth daily as needed (fluid). 06/11/20  Yes [provider]  NARCAN 4 MG/0.1ML LIQD nasal spray kit Place 0.4 mg into the nose daily as needed (opioid overdose). 04/25/19  Yes [provider]  ondansetron (ZOFRAN ODT) 4 MG disintegrating tablet Take 1 tablet (4 mg total) by mouth every 4 (four) hours as needed for nausea or vomiting. 08/23/19  Yes Pfeiffer, Marcy, MD  Oxycodone HCl 10 MG TABS Take 1 tablet (10 mg total) 4 (four) times daily by mouth. Per [facility] Patient taking differently: Take 10 mg by mouth every 4 (four) hours as needed (pain). Per [facility] 11/08/17  Yes [name], James W, MD  pantoprazole (PROTONIX) 40 MG tablet TAKE ONE TABLET BY MOUTH DAILY Patient taking differently: Take 40 mg by mouth daily. 03/03/21  Yes [name], James W, MD  potassium chloride (MICRO-K) 10 MEQ CR capsule Take 2 capsules (20 mEq total) by mouth daily. Patient taking differently: Take 30 mEq by mouth daily. 03/11/21  Yes Krishnan, Gokul, MD  rOPINIRole (REQUIP) 3 MG tablet TAKE 1 TABLET BY MOUTH NIGHTLY AT BEDTIME AS NEEDED Patient taking  differently: Take 3 mg by mouth at bedtime as needed (RLS). 02/04/21  Yes [name], James W, MD  Simethicone (GAS-X EXTRA STRENGTH PO) Take 1 tablet by mouth daily as needed (gas).   Yes [provider]  Tetrahydrozoline HCl (VISINE OP) Place 1 drop into both eyes daily as needed (dry eyes).   Yes [provider]  DULoxetine (CYMBALTA) 60 MG capsule Take 60 mg   by mouth daily.    03/09/12  [provider]  fexofenadine (ALLEGRA) 180 MG tablet Take 180 mg by mouth daily.    03/09/12  [provider]    Allergies    Amoxicillin-pot clavulanate, Adhesive [tape], Codeine, Crestor [rosuvastatin calcium], Lactose intolerance (gi), Morphine and related, Vicodin [hydrocodone-acetaminophen], and Latex  Review of Systems   Review of Systems  Respiratory: Positive for shortness of breath.   All other systems reviewed and are negative.   Physical Exam Updated Vital Signs BP (!) 125/50   Pulse 74   Temp 98 F (36.7 C)   Resp 20   Ht 5' 5" (1.651 m)   Wt (!) 166 kg   SpO2 100%   BMI 60.91 kg/m   Physical Exam Vitals and nursing note reviewed.  Constitutional:      General: She is not in acute distress.    Appearance: She is well-developed. She is obese.  HENT:     Head: Atraumatic.  Eyes:     Conjunctiva/sclera: Conjunctivae normal.  Neck:     Vascular: No JVD.  Cardiovascular:     Rate and Rhythm: Normal rate and regular rhythm.  Pulmonary:     Effort: Pulmonary effort is normal.     Breath sounds: Normal breath sounds. No decreased breath sounds, wheezing, rhonchi or rales.  Chest:     Chest wall: No tenderness.  Abdominal:     Palpations: Abdomen is soft.  Musculoskeletal:     Cervical back: Neck supple.     Right lower leg: Edema present.     Left lower leg: Edema present.  Skin:    Findings: No rash.     Comments: Edema noted to bilateral lower extremity extending towards her abdomen with chronic venous stasis skin changes to bilateral lower  extremities and intact pedal Pulses.  Neurological:     Mental Status: She is alert and oriented to person, place, and time.  Psychiatric:        Mood and Affect: Mood normal.     ED Results / Procedures / Treatments   Labs (all labs ordered are listed, but only abnormal results are displayed) Labs Reviewed  BASIC METABOLIC PANEL - Abnormal; Notable for the following components:      Result Value   Sodium 129 (*)    Potassium 2.8 (*)    Chloride 81 (*)    CO2 34 (*)    Glucose, Bld 132 (*)    BUN 24 (*)    Creatinine, Ser 1.41 (*)    Calcium 8.3 (*)    GFR, Estimated 42 (*)    All other components within normal limits  CBC - Abnormal; Notable for the following components:   Hemoglobin 11.9 (*)    HCT 35.9 (*)    All other components within normal limits  BRAIN NATRIURETIC PEPTIDE  MAGNESIUM  PHOSPHORUS  TROPONIN I (HIGH SENSITIVITY)    EKG EKG Interpretation  Date/Time:  Saturday April 05 2021 20:03:06 EDT Ventricular Rate:  83 PR Interval:  176 QRS Duration: 106 QT Interval:  394 QTC Calculation: 462 R Axis:   51 Text Interpretation: Normal sinus rhythm Cannot rule out Anterior infarct , age undetermined Abnormal ECG When compared with ECG of 04/01/2021, No significant change was found Confirmed by Delora Fuel ([id number]) on 04/06/2021 12:18:19 AM   Radiology DG Chest 2 View  Result Date: 04/05/2021 CLINICAL DATA:  Shortness of breath EXAM: CHEST - 2 VIEW COMPARISON:  04/01/2021 FINDINGS: Heart is normal  size. No confluent airspace opacities or effusions. No acute bony abnormality. IMPRESSION: No active cardiopulmonary disease. Electronically Signed   By: Kevin  Dover M.D.   On: 04/05/2021 20:43    Procedures Procedures   Medications Ordered in ED Medications  albuterol (VENTOLIN HFA) 108 (90 Base) MCG/ACT inhaler 2 puff (has no administration in time range)    ED Course  I have reviewed the triage vital signs and the nursing notes.  Pertinent labs &  imaging results that were available during my care of the patient were reviewed by me and considered in my medical decision making (see chart for details).    MDM Rules/Calculators/A&P                          BP (!) 125/50   Pulse 74   Temp 98 F (36.7 C)   Resp 20   Ht 5' 5" (1.651 m)   Wt (!) 166 kg   SpO2 100%   BMI 60.91 kg/m   Final Clinical Impression(s) / ED Diagnoses Final diagnoses:  Peripheral edema    Rx / DC Orders ED Discharge Orders    None     7:38 AM Patient reports she was diagnosed with CHF low over a month ago and has been having progressive worsening shortness of breath as well as fluid retention.  She is exhibiting anasarca, however she does not have JVD, and lungs are clear at current time.  She is not hypoxic.  She has increased her Lasix in the past several days taking 80 mg in the morning and 40 mg at night.  She is complaining of muscle cramping likely due to loss of electrolytes while being on a loop diuretic.  Currently her sodium is 129, potassium of 2.8, chloride of 81, CO2 of 34 and slightly worsening renal function with BUN 24, creatinine of 1.41.  Will give potassium supplementation, and will also give another dose of Lasix here.  Care discussed with Dr. Allen  8:45 AM Due to electrolyte derangements, and increased anasarca, as well as patient having been evaluated by cardiology and her apprehensiveness to her current condition, appreciate consultation from [facility] hospitalist, Dr. Yates who agrees to admit patient for labs and felt that this is likely due to overdiuresis causing dehydration.  Patient likely benefit from compression stocking and medication adjustment.     , , PA-C 04/06/21 1541    Allen, Anthony, MD 04/08/21 1120  

## 2021-04-07 ENCOUNTER — Ambulatory Visit: Payer: Medicare Other | Admitting: Internal Medicine

## 2021-04-07 DIAGNOSIS — E785 Hyperlipidemia, unspecified: Secondary | ICD-10-CM | POA: Diagnosis present

## 2021-04-07 DIAGNOSIS — I110 Hypertensive heart disease with heart failure: Secondary | ICD-10-CM | POA: Diagnosis present

## 2021-04-07 DIAGNOSIS — K5730 Diverticulosis of large intestine without perforation or abscess without bleeding: Secondary | ICD-10-CM | POA: Diagnosis present

## 2021-04-07 DIAGNOSIS — M545 Low back pain, unspecified: Secondary | ICD-10-CM | POA: Diagnosis present

## 2021-04-07 DIAGNOSIS — E860 Dehydration: Secondary | ICD-10-CM | POA: Diagnosis present

## 2021-04-07 DIAGNOSIS — K219 Gastro-esophageal reflux disease without esophagitis: Secondary | ICD-10-CM | POA: Diagnosis present

## 2021-04-07 DIAGNOSIS — Z20822 Contact with and (suspected) exposure to covid-19: Secondary | ICD-10-CM | POA: Diagnosis present

## 2021-04-07 DIAGNOSIS — M797 Fibromyalgia: Secondary | ICD-10-CM | POA: Diagnosis present

## 2021-04-07 DIAGNOSIS — K5900 Constipation, unspecified: Secondary | ICD-10-CM | POA: Diagnosis not present

## 2021-04-07 DIAGNOSIS — Z6841 Body Mass Index (BMI) 40.0 and over, adult: Secondary | ICD-10-CM | POA: Diagnosis not present

## 2021-04-07 DIAGNOSIS — F32A Depression, unspecified: Secondary | ICD-10-CM | POA: Diagnosis present

## 2021-04-07 DIAGNOSIS — R609 Edema, unspecified: Secondary | ICD-10-CM | POA: Diagnosis present

## 2021-04-07 DIAGNOSIS — G8929 Other chronic pain: Secondary | ICD-10-CM | POA: Diagnosis present

## 2021-04-07 DIAGNOSIS — E1151 Type 2 diabetes mellitus with diabetic peripheral angiopathy without gangrene: Secondary | ICD-10-CM | POA: Diagnosis present

## 2021-04-07 DIAGNOSIS — E861 Hypovolemia: Secondary | ICD-10-CM | POA: Diagnosis present

## 2021-04-07 DIAGNOSIS — F411 Generalized anxiety disorder: Secondary | ICD-10-CM | POA: Diagnosis present

## 2021-04-07 DIAGNOSIS — I2510 Atherosclerotic heart disease of native coronary artery without angina pectoris: Secondary | ICD-10-CM | POA: Diagnosis present

## 2021-04-07 DIAGNOSIS — G2581 Restless legs syndrome: Secondary | ICD-10-CM | POA: Diagnosis present

## 2021-04-07 DIAGNOSIS — M7989 Other specified soft tissue disorders: Secondary | ICD-10-CM | POA: Diagnosis not present

## 2021-04-07 DIAGNOSIS — E876 Hypokalemia: Secondary | ICD-10-CM | POA: Diagnosis present

## 2021-04-07 DIAGNOSIS — I5032 Chronic diastolic (congestive) heart failure: Secondary | ICD-10-CM | POA: Diagnosis not present

## 2021-04-07 DIAGNOSIS — N179 Acute kidney failure, unspecified: Secondary | ICD-10-CM | POA: Diagnosis not present

## 2021-04-07 DIAGNOSIS — E871 Hypo-osmolality and hyponatremia: Secondary | ICD-10-CM | POA: Diagnosis present

## 2021-04-07 DIAGNOSIS — I872 Venous insufficiency (chronic) (peripheral): Secondary | ICD-10-CM | POA: Diagnosis present

## 2021-04-07 DIAGNOSIS — G4733 Obstructive sleep apnea (adult) (pediatric): Secondary | ICD-10-CM | POA: Diagnosis present

## 2021-04-07 LAB — CBC
HCT: 34 % — ABNORMAL LOW (ref 36.0–46.0)
Hemoglobin: 11.7 g/dL — ABNORMAL LOW (ref 12.0–15.0)
MCH: 30.8 pg (ref 26.0–34.0)
MCHC: 34.4 g/dL (ref 30.0–36.0)
MCV: 89.5 fL (ref 80.0–100.0)
Platelets: 244 10*3/uL (ref 150–400)
RBC: 3.8 MIL/uL — ABNORMAL LOW (ref 3.87–5.11)
RDW: 12.5 % (ref 11.5–15.5)
WBC: 7.3 10*3/uL (ref 4.0–10.5)
nRBC: 0 % (ref 0.0–0.2)

## 2021-04-07 LAB — BASIC METABOLIC PANEL
Anion gap: 10 (ref 5–15)
Anion gap: 14 (ref 5–15)
BUN: 17 mg/dL (ref 8–23)
BUN: 23 mg/dL (ref 8–23)
CO2: 36 mmol/L — ABNORMAL HIGH (ref 22–32)
CO2: 30 mmol/L (ref 22–32)
Calcium: 8.7 mg/dL — ABNORMAL LOW (ref 8.9–10.3)
Calcium: 8.6 mg/dL — ABNORMAL LOW (ref 8.9–10.3)
Chloride: 88 mmol/L — ABNORMAL LOW (ref 98–111)
Chloride: 90 mmol/L — ABNORMAL LOW (ref 98–111)
Creatinine, Ser: 1.01 mg/dL — ABNORMAL HIGH (ref 0.44–1.00)
Creatinine, Ser: 1.3 mg/dL — ABNORMAL HIGH (ref 0.44–1.00)
GFR, Estimated: >60 mL/min (ref >60)
GFR, Estimated: 46 mL/min — ABNORMAL LOW (ref >60)
Glucose, Bld: 113 mg/dL — ABNORMAL HIGH (ref 70–99)
Glucose, Bld: 135 mg/dL — ABNORMAL HIGH (ref 70–99)
Potassium: 2.5 mmol/L — CL (ref 3.5–5.1)
Potassium: 4.4 mmol/L (ref 3.5–5.1)
Sodium: 134 mmol/L — ABNORMAL LOW (ref 135–145)
Sodium: 134 mmol/L — ABNORMAL LOW (ref 135–145)

## 2021-04-07 LAB — GLUCOSE, CAPILLARY
Glucose-Capillary: 117 mg/dL — ABNORMAL HIGH (ref 70–99)
Glucose-Capillary: 98 mg/dL (ref 70–99)
Glucose-Capillary: 108 mg/dL — ABNORMAL HIGH (ref 70–99)
Glucose-Capillary: 146 mg/dL — ABNORMAL HIGH (ref 70–99)

## 2021-04-07 LAB — MAGNESIUM: Magnesium: 2.1 mg/dL (ref 1.7–2.4)

## 2021-04-07 MED ORDER — POTASSIUM CHLORIDE CRYS ER 20 MEQ PO TBCR: 40.0000 meq | EXTENDED_RELEASE_TABLET | Freq: Once | ORAL | Status: DC

## 2021-04-07 MED ORDER — DM-GUAIFENESIN ER 30-600 MG PO TB12: 1.0000 | ORAL_TABLET | Freq: Two times a day (BID) | ORAL | Status: DC | PRN

## 2021-04-07 MED ORDER — POTASSIUM CHLORIDE CRYS ER 20 MEQ PO TBCR
40.0000 meq | EXTENDED_RELEASE_TABLET | ORAL | Status: DC
  Administered 2021-04-07: 40 meq via ORAL
  Filled 2021-04-07: qty 2

## 2021-04-07 MED ORDER — HYDROMORPHONE HCL 1 MG/ML IJ SOLN
1.0000 mg | INTRAMUSCULAR | Status: DC | PRN
  Administered 2021-04-07 – 2021-04-10 (×15): 1 mg via INTRAVENOUS
  Filled 2021-04-07 (×15): qty 1

## 2021-04-07 MED ORDER — MORPHINE SULFATE (PF) 2 MG/ML IV SOLN: 2.0000 mg | INTRAVENOUS | Status: DC | PRN

## 2021-04-07 MED ORDER — POTASSIUM CHLORIDE 10 MEQ/100ML IV SOLN
10.0000 meq | INTRAVENOUS | Status: AC
  Administered 2021-04-07 (×4): 10 meq via INTRAVENOUS
  Filled 2021-04-07 (×5): qty 100

## 2021-04-07 MED ORDER — POTASSIUM CHLORIDE CRYS ER 20 MEQ PO TBCR
20.0000 meq | EXTENDED_RELEASE_TABLET | Freq: Once | ORAL | Status: AC
  Administered 2021-04-07: 20 meq via ORAL
  Filled 2021-04-07: qty 1

## 2021-04-07 NOTE — Progress Notes (Signed)
PROGRESS NOTE    Maria [name]  MOQ:[id number] DOB: 02-21-1956 DOA: 04/05/2021 PCP: Biagio Borg, MD   Brief Narrative:  65 year old with history of chronic low back pain, HTN, DM2, HLD, PVD, diastolic CHF admitted for shortness of breath and weight gain discharged on Lasix now presenting again with shortness of breath, lower extremity swelling and laboratory abnormality.   Assessment & Plan:   Principal Problem:   AKI (acute kidney injury) ([place]) Active Problems:   Benign hypertension   Chronic low back pain   Morbid obesity (HCC)   Venous stasis dermatitis of both lower extremities   HLD (hyperlipidemia)   Diastolic congestive heart failure (HCC)   Edema    Acute kidney injury, improved -Baseline creatinine 1.0.  Admission creatinine 1.4.  Secondary to diuretics.  We will slowly resume her diuretics today.  Closely monitor her volume status.  Severe hypokalemia - Secondary to diuretic use.  Aggressive repletion  Chronic diastolic CHF -Echo 02/5464-KC 60 to 65%, grade 1 DD -Resume Lasix 40 mg twice daily - Losartan 100 mg daily  Stasis dermatitis Bilateral lower extremity swelling -Secondary to chronic lower extremity swelling.  Advise ambulation.  Recently was on Keflex but stopped on admission due to no evidence of infection. - Elevate legs  HTN -Losartan 100 mg daily  Chronic pain -Continue home regimen of Klonopin, Flexeril, oxycodone, Dilaudid and Requip use as needed.  Bowel regimen.  Hyperlipidemia -Not on any home medications  Diabetes mellitus type 2 -A1c 6.5.  Holding metformin - Insulin sliding scale and Accu-Chek.  Obesity -Body mass index is 60.91 kg/m..  -Weight loss should be encouraged -Mobility for her is key or she will lose her ability to mobilize soon -Outpatient PCP/bariatric medicine f/u encouraged    DVT prophylaxis: Lovenox Code Status: Full code Family Communication:    Dispo: The patient is from: Home               Anticipated d/c is to: Home              Patient currently is not medically stable to d/c.  Still severe hypokalemia requiring aggressive IV repletion.  She will also need PT/OT.  Unsafe for discharge today, hopefully over next 24 hours.  Discharge   Difficult to place patient No   Nutritional status  Nutrition Problem: Increased nutrient needs Etiology: chronic illness (CHF)  Signs/Symptoms: estimated needs  Interventions: MVI,Premier Protein,Refer to RD note for recommendations  Body mass index is 60.91 kg/m.       Subjective: Seen and examined at bedside, feels little better compared to yesterday.  Still has some exertional shortness of breath.  This morning he required IV pain medication due to severe back and lower extremity pain. Sitting up in the chair  Review of Systems Otherwise negative except as per HPI, including: General: Denies fever, chills, night sweats or unintended weight loss. Resp: Denies cough, wheezing, shortness of breath. Cardiac: Denies chest pain, palpitations, orthopnea, paroxysmal nocturnal dyspnea. GI: Denies abdominal pain, nausea, vomiting, diarrhea or constipation GU: Denies dysuria, frequency, hesitancy or incontinence MS: Denies muscle aches, joint pain or swelling Neuro: Denies headache, neurologic deficits (focal weakness, numbness, tingling), abnormal gait Psych: Denies anxiety, depression, SI/HI/AVH Skin: Denies new rashes or lesions ID: Denies sick contacts, exotic exposures, travel  Examination:  General exam: Appears calm and comfortable  Respiratory system: Bilateral diminished breath sounds Cardiovascular system: S1 & S2 heard, RRR. No JVD, murmurs, rubs, gallops or clicks.  2+ bilateral lower extremity pitting  edema Gastrointestinal system: Abdomen is nondistended, soft and nontender. No organomegaly or masses felt. Normal bowel sounds heard. Central nervous system: Alert and oriented. No focal neurological  deficits. Extremities: Symmetric 5 x 5 power. Skin: No rashes, lesions or ulcers Psychiatry: Judgement and insight appear normal. Mood & affect appropriate.     Objective: Vitals:   04/06/21 2030 04/07/21 0035 04/07/21 0430 04/07/21 0734  BP: (!) 110/45 (!) 118/46 140/64   Pulse: 78 73 79   Resp: 18 17 20    Temp: 98.5 F (36.9 C) 98.4 F (36.9 C) 98.2 F (36.8 C) 98 F (36.7 C)  TempSrc: Oral Oral Oral Oral  SpO2: 93% 96% 100% 97%  Weight:      Height:        Intake/Output Summary (Last 24 hours) at 04/07/2021 0816 Last data filed at 04/07/2021 0746 Gross per 24 hour  Intake 1440 ml  Output 4200 ml  Net -2760 ml   Filed Weights   04/05/21 2007  Weight: (!) 166 kg     Data Reviewed:   CBC: Recent Labs  Lab 04/01/21 1410 04/05/21 2013 04/07/21 0319  WBC 9.7 9.7 7.3  NEUTROABS 5.6  --   --   HGB 12.4 11.9* 11.7*  HCT 37.6 35.9* 34.0*  MCV 91.9 89.8 89.5  PLT 281 262 850   Basic Metabolic Panel: Recent Labs  Lab 04/01/21 1410 04/05/21 2013 04/06/21 0731 04/07/21 0319  NA 135 129*  --  134*  K 3.0* 2.8*  --  2.5*  CL 90* 81*  --  88*  CO2 34* 34*  --  36*  GLUCOSE 111* 132*  --  113*  BUN 23 24*  --  17  CREATININE 1.11* 1.41*  --  1.01*  CALCIUM 9.1 8.3*  --  8.7*  MG  --   --  1.7 2.1  PHOS  --   --  3.2  --    GFR: Estimated Creatinine Clearance: 90.5 mL/min (A) (by C-G formula based on SCr of 1.01 mg/dL (H)). Liver Function Tests: Recent Labs  Lab 04/01/21 1410  AST 22  ALT 21  ALKPHOS 72  BILITOT 0.9  PROT 7.5  ALBUMIN 4.0   No results for input(s): LIPASE, AMYLASE in the last 168 hours. No results for input(s): AMMONIA in the last 168 hours. Coagulation Profile: Recent Labs  Lab 04/01/21 1410  INR 1.0   Cardiac Enzymes: No results for input(s): CKTOTAL, CKMB, CKMBINDEX, TROPONINI in the last 168 hours. BNP (last 3 results) No results for input(s): PROBNP in the last 8760 hours. HbA1C: No results for input(s): HGBA1C in the  last 72 hours. CBG: Recent Labs  Lab 04/06/21 1157 04/06/21 1613 04/06/21 2026 04/07/21 0700  GLUCAP 145* 112* 139* 117*   Lipid Profile: No results for input(s): CHOL, HDL, LDLCALC, TRIG, CHOLHDL, LDLDIRECT in the last 72 hours. Thyroid Function Tests: No results for input(s): TSH, T4TOTAL, FREET4, T3FREE, THYROIDAB in the last 72 hours. Anemia Panel: No results for input(s): VITAMINB12, FOLATE, FERRITIN, TIBC, IRON, RETICCTPCT in the last 72 hours. Sepsis Labs: No results for input(s): PROCALCITON, LATICACIDVEN in the last 168 hours.  Recent Results (from the past 240 hour(s))  SARS CORONAVIRUS 2 (TAT 6-24 HRS) Nasopharyngeal Nasopharyngeal Swab     Status: None   Collection Time: 04/06/21  6:56 PM   Specimen: Nasopharyngeal Swab  Result Value Ref Range Status   SARS Coronavirus 2 NEGATIVE NEGATIVE Final    Comment: (NOTE) SARS-CoV-2 target nucleic acids are  NOT DETECTED.  The SARS-CoV-2 RNA is generally detectable in upper and lower respiratory specimens during the acute phase of infection. Negative results do not preclude SARS-CoV-2 infection, do not rule out co-infections with other pathogens, and should not be used as the sole basis for treatment or other patient management decisions. Negative results must be combined with clinical observations, patient history, and epidemiological information. The expected result is Negative.  Fact Sheet for Patients: SugarRoll.be  Fact Sheet for Healthcare Providers: https://www.woods-mathews.com/  This test is not yet approved or cleared by the [place] FDA and  has been authorized for detection and/or diagnosis of SARS-CoV-2 by FDA under an Emergency Use Authorization (EUA). This EUA will remain  in effect (meaning this test can be used) for the duration of the COVID-19 declaration under Se ction 564(b)(1) of the Act, 21 U.S.C. section 360bbb-3(b)(1), unless the authorization is  terminated or revoked sooner.  Performed at [facility], [place] [address], [place], [place] [address]          Radiology Studies: DG Chest 2 View  Result Date: 04/05/2021 CLINICAL DATA:  Shortness of breath EXAM: CHEST - 2 VIEW COMPARISON:  04/01/2021 FINDINGS: Heart is normal size. No confluent airspace opacities or effusions. No acute bony abnormality. IMPRESSION: No active cardiopulmonary disease. Electronically Signed   By: Rolm Baptise M.D.   On: 04/05/2021 20:43        Scheduled Meds: . (feeding supplement) PROSource Plus  30 mL Oral BID BM  . docusate sodium  100 mg Oral BID  . enoxaparin (LOVENOX) injection  80 mg Subcutaneous Q24H  . furosemide  40 mg Oral BID  . insulin aspart  0-15 Units Subcutaneous TID WC  . insulin aspart  0-5 Units Subcutaneous QHS  . losartan  100 mg Oral Daily  .  morphine injection  4 mg Intravenous Once  . multivitamin with minerals  1 tablet Oral Daily  . pantoprazole  40 mg Oral Daily  . potassium chloride  20 mEq Oral Once  . sodium chloride flush  3 mL Intravenous Q12H  . triamcinolone 0.1 % cream : eucerin   Topical BID   Continuous Infusions: . potassium chloride       LOS: 0 days   Time spent= 35 mins    Draylon Mercadel Arsenio Loader, MD [facility]  If 7PM-7AM, please contact night-coverage  04/07/2021, 8:16 AM

## 2021-04-07 NOTE — Consult Note (Signed)
   [facility] CM Inpatient Consult   04/07/2021  Maria [name] May 16, 1956 [id number]   [place] [facility] [ACO] Patient: [name]   Patient screened for hospitalization with noted high risk score for unplanned readmission risk and to assess for potential restart of [place] Management service needs for post hospital transition.  Review of patient's medical record reveals patient is has been active with [facility] in the distant past.    Plan: Will follow with inpatient [facility] team and for progress and disposition to assess for post hospital care management needs.    For questions contact:   Natividad Brood, RN BSN [facility] Liaison  [phone] business mobile phone Toll free office [phone]  Fax number: [phone] [email or web address]

## 2021-04-07 NOTE — Plan of Care (Signed)

## 2021-04-07 NOTE — Plan of Care (Signed)

## 2021-04-07 NOTE — Progress Notes (Signed)
Occupational Therapy Evaluation Patient Details Name: Maria [name] MRN: [id number] DOB: May 30, 1956 Today's Date: 04/07/2021    History of Present Illness 65 y.o. female admitted with CHF exacerbation. PMH: chronic low back pain; HTN; HLD; DM; PVD; and chronic diastolic CHF presenting with SOB.   Clinical Impression   PTA pt lives alone, ambulates in the home @ cane level and uses a rollator for the community. Pt has Meals on Wheels and intermittent assistance form family. Pt reports LB ADL tasks and pericare are difficult for her to complete in addition to IADL tasks such as cooking and cleaning.  Pt will benefit from skilled OT services to educate pt on energy conservation strategies in addition to further assess use of AE and compensatory strategies to maximize functional level of independence with ADL and IADL tasks and to facilitate safe DC home. Recommend follow up with [place].     Follow Up Recommendations  Home health OT    Equipment Recommendations  None recommended by OT    Recommendations for Other Services       Precautions / Restrictions Precautions Precautions: Fall      Mobility Bed Mobility               General bed mobility comments: OOB in chair    Transfers Overall transfer level: Modified independent                    Balance Overall balance assessment: No apparent balance deficits (not formally assessed)                                         ADL either performed or assessed with clinical judgement   ADL Overall ADL's : Needs assistance/impaired     Grooming: Set up;Sitting;Standing   Upper Body Bathing: Set up;Sitting   Lower Body Bathing: Minimal assistance;Sit to/from stand   Upper Body Dressing : Set up;Sitting   Lower Body Dressing: Minimal assistance;Sit to/from stand   Toilet Transfer: Supervision/safety;Ambulation (cane)   Toileting- Clothing Manipulation and Hygiene: Minimal assistance;Sit to/from  stand       Functional mobility during ADLs: Supervision/safety General ADL Comments: Difficulty with pericare and LB ADL due to body habitus. Would benefit from AE and education on compensatory techniques. Per MD note needs compression hose. This will be difficlty for pt to donn and she would benefit from sock aid or AE to donn stockings. Pt states cooking is difficult as well as cleaning.  Excoriated area/minimal amount of bleeding noted in gluteal cleft area. Nsg notified. Area cleaned, dried and moisture barrier cream applied     Vision Baseline Vision/History: Wears glasses       Perception     Praxis      Pertinent Vitals/Pain Pain Assessment: 0-10 Pain Score: 5  Pain Location: lower back Pain Descriptors / Indicators: Aching Pain Intervention(s): Limited activity within patient's tolerance     Hand Dominance Right   Extremity/Trunk Assessment Upper Extremity Assessment Upper Extremity Assessment: Overall WFL for tasks assessed   Lower Extremity Assessment Lower Extremity Assessment: Defer to PT evaluation   Cervical / Trunk Assessment Cervical / Trunk Assessment: Other exceptions (increased body habitus)   Communication Communication Communication: No difficulties   Cognition Arousal/Alertness: Awake/alert Behavior During Therapy: Anxious Overall Cognitive Status: Within Functional Limits for tasks assessed  General Comments  Began educaiton on pursed lip breathing adn pacing herself during activities    Exercises     Shoulder Instructions      Home Living Family/patient expects to be discharged to:: Private residence Living Arrangements: Alone Available Help at Discharge: Friend(s) Type of Home: Apartment Home Access: Stairs to enter [employer] of Steps: 6 steps that are rather uneven, where some are normal steps and some more like a platform. Supposed to get ramp this Spring. Entrance  Stairs-Rails: Right Home Layout: One level     Bathroom Shower/Tub: Tub/shower unit;Curtain   Bathroom Toilet: Standard Bathroom Accessibility: No   Home Equipment: Cane - single point;Walker - 4 wheels;Bedside commode;Shower seat;Hand held shower head;Grab bars - tub/shower;Adaptive equipment Adaptive Equipment: Reacher;Sock aid;Long-handled shoe horn;Long-handled sponge        Prior Functioning/Environment Level of Independence: Independent with assistive device(s);Needs assistance  Gait / Transfers Assistance Needed: uses SPC typically for household distances and rollator for community ambulation. No falls recently. ADL's / Homemaking Assistance Needed: Does minimal cooking and needs to sit frequently. Does not drive. Uses instacart for groceries.            OT Problem List: Decreased activity tolerance;Decreased strength;Decreased knowledge of use of DME or AE;Cardiopulmonary status limiting activity;Obesity;Pain      OT Treatment/Interventions: Self-care/ADL training;Therapeutic exercise;Energy conservation;DME and/or AE instruction;Therapeutic activities;Patient/family education    OT Goals(Current goals can be found in the care plan section) Acute Rehab OT Goals Patient Stated Goal: to be able to take care of herself OT Goal Formulation: With patient Time For Goal Achievement: 04/21/21 Potential to Achieve Goals: Good  OT Frequency: Min 2X/week   Barriers to D/C:            Co-evaluation              AM-PAC OT "6 Clicks" Daily Activity     Outcome Measure Help from another person eating meals?: None Help from another person taking care of personal grooming?: A Little Help from another person toileting, which includes using toliet, bedpan, or urinal?: A Little Help from another person bathing (including washing, rinsing, drying)?: A Little Help from another person to put on and taking off regular upper body clothing?: A Little Help from another person to  put on and taking off regular lower body clothing?: A Little 6 Click Score: 19   End of Session Nurse Communication: Mobility status;Other (comment) (exoriated area in gluteal cleft' pt request to switch to tele box)  Activity Tolerance: Patient tolerated treatment well Patient left: in chair;with call bell/phone within reach;with chair alarm set  OT Visit Diagnosis: Unsteadiness on feet (R26.81);Muscle weakness (generalized) (M62.81);Pain Pain - part of body:  (back)                Time: 5462-7035 OT Time Calculation (min): 46 min Charges:  OT General Charges $OT Visit: 1 Visit OT Evaluation $OT Eval Low Complexity: 1 Low OT Treatments $Self Care/Home Management : 23-37 mins  Maurie Boettcher, OT/L   Acute OT Clinical Specialist Acute Rehabilitation Services Pager [phone] Office [phone]   [facility] 04/07/2021, 10:00 AM

## 2021-04-07 NOTE — Evaluation (Signed)
Physical Therapy Evaluation Patient Details Name: Maria [name] MRN: [id number] DOB: 1955/12/28 Today's Date: 04/07/2021   History of Present Illness  65 y.o. female admitted with CHF exacerbation. PMH: chronic low back pain; HTN; HLD; DM; PVD; and chronic diastolic CHF presenting with SOB.  Clinical Impression  Patient presents with generalized weakness, decreased activity tolerance, chronic pain and impaired mobility s/p above. Pt lives at home alone and reports using SPC vs rollator for ambulation and does her own ADLs. Reports no falls. Today, pt tolerated bed mobility, transfers and short distance ambulation with Min guard-Mod I for safety and use of SPC. Distance limited due to pain. VSS on RA. Swelling present in BLEs and feet. Encouraged increasing activity and walking to bathroom with nursing as able. Will follow acutely to maximize independence and mobility prior to return home.    Follow Up Recommendations Home health PT;Supervision - Intermittent    Equipment Recommendations  None recommended by PT    Recommendations for Other Services       Precautions / Restrictions Precautions Precautions: Fall Restrictions Weight Bearing Restrictions: No      Mobility  Bed Mobility Overal bed mobility: Needs Assistance Bed Mobility: Rolling;Sidelying to Sit Rolling: Supervision Sidelying to sit: Supervision;HOB elevated       General bed mobility comments: Heavy use of rail to get to EOB with increased time. no assist.    Transfers Overall transfer level: Modified independent Equipment used: Straight cane             General transfer comment: Stood from EOB x1, transferred to chair with SPC.  Ambulation/Gait Ambulation/Gait assistance: Min guard Gait Distance (Feet): 4 Feet Assistive device: Straight cane Gait Pattern/deviations: Wide base of support;Shuffle Gait velocity: decreased   General Gait Details: Able to take a few steps to get to chair with Min  guard assist for safety. Able to perform turn without LOB. Limited due to pain. VSS on RA.  Stairs            Wheelchair Mobility    Modified Rankin (Stroke Patients Only)       Balance Overall balance assessment: Needs assistance Sitting-balance support: Feet supported;No upper extremity supported Sitting balance-Leahy Scale: Good     Standing balance support: During functional activity Standing balance-Leahy Scale: Fair Standing balance comment: Requires UE support in standing.                             Pertinent Vitals/Pain Pain Assessment: 0-10 Pain Score: 5  Pain Location: lower back Pain Descriptors / Indicators: Aching Pain Intervention(s): Monitored during session;Repositioned;Premedicated before session;Limited activity within patient's tolerance    Home Living Family/patient expects to be discharged to:: Private residence Living Arrangements: Alone Available Help at Discharge: Friend(s) Type of Home: Apartment Home Access: Stairs to enter Entrance Stairs-Rails: Right Entrance Stairs-Number of Steps: 6 steps that are rather uneven, where some are normal steps and some more like a platform. Supposed to get ramp this Spring. Home Layout: One level Home Equipment: Cane - single point;Walker - 4 wheels;Bedside commode;Shower seat;Hand held shower head;Grab bars - tub/shower;Adaptive equipment      Prior Function Level of Independence: Independent with assistive device(s);Needs assistance   Gait / Transfers Assistance Needed: uses SPC typically for household distances and rollator for community ambulation. No falls recently.  ADL's / Homemaking Assistance Needed: Does minimal cooking and needs to sit frequently. Does not drive. Uses instacart for groceries.  Hand Dominance   Dominant Hand: Right    Extremity/Trunk Assessment   Upper Extremity Assessment Upper Extremity Assessment: Defer to OT evaluation    Lower Extremity  Assessment Lower Extremity Assessment: Generalized weakness;RLE deficits/detail RLE Deficits / Details: Pins/needles RLE esp in thigh radiating down to foot.    Cervical / Trunk Assessment Cervical / Trunk Assessment: Other exceptions Cervical / Trunk Exceptions: increased body habitus  Communication   Communication: No difficulties  Cognition Arousal/Alertness: Awake/alert Behavior During Therapy: Anxious Overall Cognitive Status: Within Functional Limits for tasks assessed                                 General Comments: Emotional and tearful talking about aliments and weight gain.      General Comments General comments (skin integrity, edema, etc.): Discussed importance of slowly increasing activity and walking program at home. Pt reports she walks during commericial breaks and wears a fit bit aiming to get 5000 steps.    Exercises     Assessment/Plan    PT Assessment Patient needs continued PT services  PT Problem List Decreased strength;Decreased mobility;Pain;Impaired sensation;Decreased balance;Decreased activity tolerance       PT Treatment Interventions Therapeutic exercise;Gait training;Patient/family education;Therapeutic activities;Functional mobility training;Stair training;Balance training    PT Goals (Current goals can be found in the Care Plan section)  Acute Rehab PT Goals Patient Stated Goal: to be able to take care of herself PT Goal Formulation: With patient Time For Goal Achievement: 04/21/21 Potential to Achieve Goals: Good    Frequency Min 3X/week   Barriers to discharge Decreased caregiver support lives alone    Co-evaluation               AM-PAC PT "6 Clicks" Mobility  Outcome Measure Help needed turning from your back to your side while in a flat bed without using bedrails?: A Little Help needed moving from lying on your back to sitting on the side of a flat bed without using bedrails?: A Little Help needed moving to  and from a bed to a chair (including a wheelchair)?: A Little Help needed standing up from a chair using your arms (e.g., wheelchair or bedside chair)?: A Little Help needed to walk in hospital room?: A Little Help needed climbing 3-5 steps with a railing? : A Little 6 Click Score: 18    End of Session   Activity Tolerance: Patient limited by pain Patient left: in chair;with call bell/phone within reach;with chair alarm set Nurse Communication: Mobility status PT Visit Diagnosis: Pain;Difficulty in walking, not elsewhere classified (R26.2) Pain - part of body:  (back)    Time: 2956-2130 PT Time Calculation (min) (ACUTE ONLY): 33 min   Charges:   PT Evaluation $PT Eval Moderate Complexity: 1 Mod PT Treatments $Therapeutic Activity: 8-22 mins        Marisa Severin, PT, DPT Acute Rehabilitation Services Pager [phone] Office [phone]      [name] 04/07/2021, 11:32 AM

## 2021-04-08 ENCOUNTER — Encounter (HOSPITAL_COMMUNITY): Payer: Self-pay | Admitting: Internal Medicine

## 2021-04-08 DIAGNOSIS — I5032 Chronic diastolic (congestive) heart failure: Secondary | ICD-10-CM | POA: Diagnosis not present

## 2021-04-08 DIAGNOSIS — N179 Acute kidney failure, unspecified: Principal | ICD-10-CM

## 2021-04-08 LAB — COMPREHENSIVE METABOLIC PANEL
ALT: 19 U/L (ref 0–44)
AST: 20 U/L (ref 15–41)
Albumin: 3.3 g/dL — ABNORMAL LOW (ref 3.5–5.0)
Alkaline Phosphatase: 57 U/L (ref 38–126)
Anion gap: 7 (ref 5–15)
BUN: 23 mg/dL (ref 8–23)
CO2: 37 mmol/L — ABNORMAL HIGH (ref 22–32)
Calcium: 8.4 mg/dL — ABNORMAL LOW (ref 8.9–10.3)
Chloride: 92 mmol/L — ABNORMAL LOW (ref 98–111)
Creatinine, Ser: 1.14 mg/dL — ABNORMAL HIGH (ref 0.44–1.00)
GFR, Estimated: 54 mL/min — ABNORMAL LOW (ref >60)
Glucose, Bld: 123 mg/dL — ABNORMAL HIGH (ref 70–99)
Potassium: 2.8 mmol/L — ABNORMAL LOW (ref 3.5–5.1)
Sodium: 136 mmol/L (ref 135–145)
Total Bilirubin: 1 mg/dL (ref 0.3–1.2)
Total Protein: 5.9 g/dL — ABNORMAL LOW (ref 6.5–8.1)

## 2021-04-08 LAB — GLUCOSE, CAPILLARY
Glucose-Capillary: 109 mg/dL — ABNORMAL HIGH (ref 70–99)
Glucose-Capillary: 104 mg/dL — ABNORMAL HIGH (ref 70–99)
Glucose-Capillary: 95 mg/dL (ref 70–99)
Glucose-Capillary: 110 mg/dL — ABNORMAL HIGH (ref 70–99)

## 2021-04-08 LAB — MAGNESIUM: Magnesium: 1.9 mg/dL (ref 1.7–2.4)

## 2021-04-08 MED ORDER — POTASSIUM CHLORIDE CRYS ER 20 MEQ PO TBCR
40.0000 meq | EXTENDED_RELEASE_TABLET | ORAL | Status: AC
  Administered 2021-04-08 (×2): 40 meq via ORAL
  Filled 2021-04-08 (×2): qty 2

## 2021-04-08 MED ORDER — POTASSIUM CHLORIDE 10 MEQ/100ML IV SOLN
10.0000 meq | INTRAVENOUS | Status: AC
  Administered 2021-04-08 (×4): 10 meq via INTRAVENOUS
  Filled 2021-04-08 (×4): qty 100

## 2021-04-08 MED ORDER — METOPROLOL TARTRATE 25 MG PO TABS
25.0000 mg | ORAL_TABLET | Freq: Two times a day (BID) | ORAL | Status: DC
  Administered 2021-04-08 – 2021-04-11 (×6): 25 mg via ORAL
  Filled 2021-04-08 (×6): qty 1

## 2021-04-08 MED ORDER — TORSEMIDE 20 MG PO TABS
40.0000 mg | ORAL_TABLET | Freq: Two times a day (BID) | ORAL | Status: DC
  Administered 2021-04-08 – 2021-04-11 (×6): 40 mg via ORAL
  Filled 2021-04-08 (×6): qty 2

## 2021-04-08 NOTE — Progress Notes (Signed)
Physical Therapy Treatment Patient Details Name: Maria [name] MRN: [id number] DOB: 05/19/1956 Today's Date: 04/08/2021    History of Present Illness 65 y.o. female admitted with CHF exacerbation. PMH: chronic low back pain; HTN; HLD; DM; PVD; and chronic diastolic CHF presenting with SOB.    PT Comments    Patient progressing well towards PT goals. Reports pain is well controlled here in the hospital and worse when waking up in the morning. Tolerated gait training with rollator for ambulation and Min guard assist for safety; requiring 1 seated rest break due to 2/4 DOE, fatigue and pain. VSS on RA with max HR of 101 bpm and Sp02 in high 90s throughout. Encouraged increasing activity as able especially walking to bathroom to improve endurance and strength while in the hospital.  Will continue to follow.   Follow Up Recommendations  Home health PT;Supervision - Intermittent     Equipment Recommendations  None recommended by PT    Recommendations for Other Services       Precautions / Restrictions Precautions Precautions: Fall Restrictions Weight Bearing Restrictions: No    Mobility  Bed Mobility               General bed mobility comments: Up in chair upon PT arrival.    Transfers Overall transfer level: Modified independent Equipment used: None             General transfer comment: No assist needed. Stood from Automotive engineer.  Ambulation/Gait Ambulation/Gait assistance: Min guard Gait Distance (Feet): 100 Feet (x2 bouts) Assistive device: 4-wheeled walker Gait Pattern/deviations: Step-through pattern;Decreased stride length;Wide base of support Gait velocity: decreased   General Gait Details: Slow, mostly steady gait with rollator for support; 1 seated rest break due to 2/4 DOE. VSS on RA.   Stairs             Wheelchair Mobility    Modified Rankin (Stroke Patients Only)       Balance Overall balance assessment: Needs  assistance Sitting-balance support: Feet supported;No upper extremity supported Sitting balance-Leahy Scale: Good     Standing balance support: During functional activity Standing balance-Leahy Scale: Fair Standing balance comment: Reaching outside BoS to put cane away and adjust pad in chair without LOB.                            Cognition Arousal/Alertness: Awake/alert Behavior During Therapy: WFL for tasks assessed/performed Overall Cognitive Status: Within Functional Limits for tasks assessed                                 General Comments: Emotions seem improved today      Exercises      General Comments General comments (skin integrity, edema, etc.): Discussed importance of mobility esp while in the hospital and walking to/from bathroom.      Pertinent Vitals/Pain Pain Assessment: 0-10 Pain Score: 5  Faces Pain Scale: Hurts little more Pain Location: lower back Pain Descriptors / Indicators: Discomfort;Sore Pain Intervention(s): Monitored during session;Repositioned    Home Living                      Prior Function            PT Goals (current goals can now be found in the care plan section) Acute Rehab PT Goals Patient Stated Goal: to increase education on CHF Progress towards  PT goals: Progressing toward goals    Frequency    Min 3X/week      PT Plan Current plan remains appropriate    Co-evaluation              AM-PAC PT "6 Clicks" Mobility   Outcome Measure  Help needed turning from your back to your side while in a flat bed without using bedrails?: A Little Help needed moving from lying on your back to sitting on the side of a flat bed without using bedrails?: A Little Help needed moving to and from a bed to a chair (including a wheelchair)?: None Help needed standing up from a chair using your arms (e.g., wheelchair or bedside chair)?: None Help needed to walk in hospital room?: A Little Help  needed climbing 3-5 steps with a railing? : A Little 6 Click Score: 20    End of Session   Activity Tolerance: Patient tolerated treatment well Patient left: in chair;with call bell/phone within reach Nurse Communication: Mobility status PT Visit Diagnosis: Pain;Difficulty in walking, not elsewhere classified (R26.2) Pain - part of body:  (back)     Time: 6060-0459 PT Time Calculation (min) (ACUTE ONLY): 20 min  Charges:  $Gait Training: 8-22 mins                     Marisa Severin, PT, DPT Acute Rehabilitation Services Pager [phone] Office [phone]       [name] 04/08/2021, 12:36 PM

## 2021-04-08 NOTE — Consult Note (Signed)
   [facility] CM Inpatient Consult   04/08/2021  Carlis Burnsworth Shon [birth year] [id number]  [name] [facility] [ACO] Patient: [employer] with at bedside, sitting up in recliner, to  explain [place] Management services with the [facility] Chronic Care Management team.  Explained that the patient qualifies for chronic care disease management as a benefit.  Patient verbalized understanding and welcomes the follow up, she says.  Plan:  Will refer patient to [place] for follow up.  This office also does the TOC follow up.    Of note, [facility] Care Management services does not replace or interfere with any services that are arranged by inpatient case management or social work.  For additional questions or referrals please contact:    Natividad Brood, RN BSN [facility] Liaison  [phone] business mobile phone Toll free office [phone]  Fax number: [phone] [email or web address]

## 2021-04-08 NOTE — Progress Notes (Signed)
Heart Failure Nurse Navigator Progress Note  PCP: Biagio Borg, MD PCP-Cardiologist: Percival Spanish, MD Admission Diagnosis: A/C dCHF Admitted from: home  Presentation:   Maria [name] presented with SOB, BLE edema, and weight gain.Pt has had 2 readmissions for CHF, also recent ED visits for same issues.    ECHO/ LVEF: 3/22 60-65%, G1DD  Clinical Course:  Past Medical History:  Diagnosis Date  . Acute lymphadenitis 2011  . ALLERGIC RHINITIS 08/10/2007  . ANXIETY 12/06/2007  . Atherosclerotic peripheral vascular disease ([place]) 06/13/2013   Aorta on CT June 2014  . Cellulitis 2011  . Cervical disc disease 03/09/2012  . CHF (congestive heart failure) ([place])   . Chronic pain 03/09/2012  . DEPRESSION 12/06/2007  . Diabetes ([place])   . DIVERTICULOSIS, [place] 12/06/2007  . GERD 12/06/2007  . Hepatitis    age 65 hepatitis A  . HLD (hyperlipidemia) 05/24/2019  . HYPERTENSION 12/06/2007  . Lumbar disc disease 03/09/2012  . Mesenteric adenitis   . MRSA 2006  . Pain in joint, multiple sites 04/21/2010  . Pneumonia sept 2015   walking pneumonia, sepsis   . 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 THORACIC/LUMBOSACRAL NEURITIS/RADICULITIS UNSPEC 12/28/2008     Social History   Socioeconomic History  . Marital status: Divorced    Spouse name: Not on file  . Number of children: 2  . Years of education: Not on file  . Highest education level: Not on file  Occupational History  . Occupation: disabled  Tobacco Use  . Smoking status: Former Smoker    Years: 30.00    Quit date: 11/08/2008    Years since quitting: 12.4  . Smokeless tobacco: Never Used  . Tobacco comment: quit 10/09  Vaping Use  . Vaping Use: Never used  Substance and Sexual Activity  . Alcohol use: No    Comment: quit drinking 10/07/1997  . Drug use: No  . Sexual activity: Not Currently    Birth control/protection: Surgical, Abstinence  Other Topics Concern  . Not on file  Social History Narrative   Patient has  difficulty financially affording food, medication, and affording essential bills. She states she has limited support from her daughter.    Social Determinants of Health   Financial Resource Strain: Medium Risk  . Difficulty of Paying Living Expenses: Somewhat hard  Food Insecurity: Food Insecurity Present  . Worried About Charity fundraiser in the Last Year: Sometimes true  . Ran Out of Food in the Last Year: Sometimes true  Transportation Needs: No Transportation Needs  . Lack of Transportation (Medical): No  . Lack of Transportation (Non-Medical): No  Physical Activity: Inactive  . Days of Exercise per Week: 0 days  . Minutes of Exercise per Session: 0 min  Stress: No Stress Concern Present  . Feeling of Stress : Not at all  Social Connections: Unknown  . Frequency of Communication with Friends and Family: More than three times a week  . Frequency of Social Gatherings with Friends and Family: More than three times a week  . Attends Religious Services: Patient refused  . Active Member of Clubs or Organizations: Patient refused  . Attends Archivist Meetings: Patient refused  . Marital Status: Never married    High Risk Criteria for Readmission and/or Poor Patient Outcomes:  Heart failure hospital admissions (last 6 months): 2   No Show rate: 4%  Difficult social situation: no  Demonstrates medication adherence: yes  Primary Language: English  Literacy level: able to read/write  and comprehend  Barriers of Care:   -transportation: arranged [facility], pt does utilize transportation through her insurance company.   Considerations/Referrals:   Referral made to Heart Failure Pharmacist Stewardship: to see at [facility] clinic. Referral made to Heart & Vascular TOC clinic: yes, 4/27 @ 10AM  Items for Follow-up on DC/TOC: -medication compliance/optimization  Pricilla Holm, RN, BSN Heart Failure Nurse Navigator [phone]

## 2021-04-08 NOTE — Consult Note (Addendum)
Cardiology Consultation:   Patient ID: Maria [name] MRN: [id number]; DOB: [birth year]  Admit date: 04/05/2021 Date of Consult: 04/08/2021  PCP:  Biagio Borg, MD   [place]  Cardiologist:  [place] [facility] Provider:  No care team member to display Electrophysiologist:  None :[id number]}    Patient Profile:   Maria [name] is a 65 y.o. female with a PMH of chronic diastolic CHF, HTN, HLD, DM type 2, PVD, morbid obesity, fibromyalgia, restless legs, and chronic back pain, who is being seen today for the evaluation of CHF at the request of Dr. Reesa Chew.  History of Present Illness:   Maria [name] was recently admitted to [facility] hospital from 03/06/21-03/11/21 for acute on chronic diastolic CHF after presenting with weight gain and LE edema. She was diuresed with IV lasix and discharged home on increased po lasix 9m BID. She was subsequently seen in the ED 04/01/21 with complaints of of weight gain of 5lbs overnight and DOE. She was given a dose of IV lasix 467mand discharged home on po lasix [id number] days with increased potassium.  She again presented to the ED 04/06/21 with complaints of leg cramping, SOB, weight gain, abdominal swelling, and LE edema. She was found to have electrolyte imbalance with Na 129 and K 2.8. Cr elevated 1.4 from baseline 1.0. She was felt to be mildly over diuresed and admitted to medicine for further management. She has continued to struggle with hypokalemia this admission requiring aggressive repletion. Cr improving to 1.1 today. Cardiology asked to evaluate for input on medication management.   She has not followed with cardiology outpatient, though is scheduled to see Dr. BerGwenlyn Found24/22 to establish care. Her last echocardiogram 03/08/21 showed EF 60-65%, G1DD, no RWMA, elevated LVEDP, and no significant valvular abnormalities. Does not appear to have had prior ischemic testing though does have evidence of prior coronary artery  calcifications (specifically LAD) noted on prior CTA Chest in 2020.   At the time of this evaluation she reports her breathing is fairly stable. She walked to the nurses station earlier with minimal DOE. She reports for the past 6 months she has noticed increased exertional fatigue, DOE, LE edema, and abdominal bloating. She has rare chest pressure when she exerts herself a lot. She has noticed increased somnolence, frequently taking naps throughout the day. States she had a sleep study done 1 year ago which revealed restless legs but no OSA. On record review, home sleep study from 2019 showed mild OSA. She reports rare episodes of palpitations.    Past Medical History:  Diagnosis Date  . Acute lymphadenitis 2011  . ALLERGIC RHINITIS 08/10/2007  . ANXIETY 12/06/2007  . Atherosclerotic peripheral vascular disease ([facility]/24/2014   Aorta on CT June 2014  . Cellulitis 2011  . Cervical disc disease 03/09/2012  . CHF (congestive heart failure) ([facility] . Chronic pain 03/09/2012  . DEPRESSION 12/06/2007  . Diabetes ([facility] . DIVERTICULOSIS, [place] 12/06/2007  . GERD 12/06/2007  . Hepatitis    age 36 h32patitis A  . HLD (hyperlipidemia) 05/24/2019  . HYPERTENSION 12/06/2007  . Lumbar disc disease 03/09/2012  . Mesenteric adenitis   . MRSA 2006  . Pain in joint, multiple sites 04/21/2010  . Pneumonia sept 2015   walking pneumonia, sepsis   . 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 THORACIC/LUMBOSACRAL NEURITIS/RADICULITIS UNSPEC 12/28/2008    Past Surgical History:  Procedure Laterality Date  . ABDOMINAL HYSTERECTOMY  1999  1 ovary left  . bloo clot removed from neck   may 25th , june 2. 2010  . [place] SURGERY  may 24th 2010  . COLONOSCOPY WITH PROPOFOL N/A 07/10/2016   Procedure: COLONOSCOPY WITH PROPOFOL;  Surgeon: Manus Gunning, MD;  Location: [facility];  Service: Gastroenterology;  Laterality: N/A;  . COLONOSCOPY WITH PROPOFOL N/A 09/26/2019   Procedure: COLONOSCOPY  WITH PROPOFOL;  Surgeon: Yetta Flock, MD;  Location: [facility];  Service: Gastroenterology;  Laterality: N/A;  . ESOPHAGOGASTRODUODENOSCOPY (EGD) WITH PROPOFOL N/A 07/10/2016   Procedure: ESOPHAGOGASTRODUODENOSCOPY (EGD) WITH PROPOFOL;  Surgeon: Manus Gunning, MD;  Location: [facility];  Service: Gastroenterology;  Laterality: N/A;  . POLYPECTOMY  09/26/2019   Procedure: POLYPECTOMY;  Surgeon: Yetta Flock, MD;  Location: [facility];  Service: Gastroenterology;;  . s/p ovary cyst    . s/p right knee arthroscopy     Dr. Mardelle Matte ortho     Home Medications:  Prior to Admission medications   Medication Sig Start Date End Date Taking? Authorizing Provider  acetaminophen (TYLENOL) 325 MG tablet Take 650 mg by mouth every 6 (six) hours as needed for headache (pain).   Yes [provider]  albuterol (PROAIR HFA) 108 (90 Base) MCG/ACT inhaler Inhale 2 puffs into the lungs every 6 (six) hours as needed for wheezing or shortness of breath. 11/05/20  Yes Colin Benton R, DO  cephALEXin (KEFLEX) 500 MG capsule Take 500 mg by mouth See admin instructions. Tid x 10 days 04/03/21  Yes [provider]  clonazePAM (KLONOPIN) 1 MG tablet TAKE 1 TABLET BY MOUTH 2 TIMES A DAY AS NEEDED FOR ANXIETY Patient taking differently: Take 1 mg by mouth 2 (two) times daily as needed for anxiety. 12/10/20  Yes Biagio Borg, MD  cyclobenzaprine (FLEXERIL) 10 MG tablet TAKE 1 TABLET BY MOUTH 2 TIMES A DAY AS NEEDED FOR MUSCLE SPASMS Patient taking differently: Take 10 mg by mouth 2 (two) times daily as needed for muscle spasms. 02/27/21  Yes Biagio Borg, MD  furosemide (LASIX) 20 MG tablet Take 3 tablets (60 mg total) by mouth 2 (two) times daily. Patient taking differently: Take 40-80 mg by mouth See admin instructions. 80 mg in the morning. 40 mg in the evening. 03/11/21  Yes Bonnielee Haff, MD  LINZESS 145 MCG CAPS capsule Take 145 mcg by mouth daily as needed (constipation).  08/15/20  Yes [provider]  loperamide (IMODIUM) 2 MG capsule Take 2 mg by mouth as needed for diarrhea or loose stools.   Yes [provider]  losartan (COZAAR) 100 MG tablet TAKE 1 TABLET BY MOUTH DAILY Patient taking differently: Take 100 mg by mouth daily. 08/06/20  Yes Biagio Borg, MD  metFORMIN (GLUCOPHAGE) 500 MG tablet Take 1 tablet (500 mg total) by mouth 2 (two) times daily with a meal. 03/11/21 05/10/21 Yes Bonnielee Haff, MD  metolazone (ZAROXOLYN) 2.5 MG tablet Take 2.5 mg by mouth daily as needed (fluid). 06/11/20  Yes [provider]  NARCAN 4 MG/0.1ML LIQD nasal spray kit Place 0.4 mg into the nose daily as needed (opioid overdose). 04/25/19  Yes [provider]  ondansetron (ZOFRAN ODT) 4 MG disintegrating tablet Take 1 tablet (4 mg total) by mouth every 4 (four) hours as needed for nausea or vomiting. 08/23/19  Yes Charlesetta Shanks, MD  Oxycodone HCl 10 MG TABS Take 1 tablet (10 mg total) 4 (four) times daily by mouth. Per [facility] Patient taking differently:  Take 10 mg by mouth every 4 (four) hours as needed (pain). Per [facility] 11/08/17  Yes Biagio Borg, MD  pantoprazole (PROTONIX) 40 MG tablet TAKE ONE TABLET BY MOUTH DAILY Patient taking differently: Take 40 mg by mouth daily. 03/03/21  Yes Biagio Borg, MD  potassium chloride (MICRO-K) 10 MEQ CR capsule Take 2 capsules (20 mEq total) by mouth daily. Patient taking differently: Take 30 mEq by mouth daily. 03/11/21  Yes Bonnielee Haff, MD  rOPINIRole (REQUIP) 3 MG tablet TAKE 1 TABLET BY MOUTH NIGHTLY AT BEDTIME AS NEEDED Patient taking differently: Take 3 mg by mouth at bedtime as needed (RLS). 02/04/21  Yes Biagio Borg, MD  Simethicone (GAS-X EXTRA STRENGTH PO) Take 1 tablet by mouth daily as needed (gas).   Yes [provider]  Tetrahydrozoline HCl (VISINE OP) Place 1 drop into both eyes daily as needed (dry eyes).   Yes [provider]  DULoxetine  (CYMBALTA) 60 MG capsule Take 60 mg by mouth daily.    03/09/12  [provider]  fexofenadine (ALLEGRA) 180 MG tablet Take 180 mg by mouth daily.    03/09/12  [provider]    Inpatient Medications: Scheduled Meds: . (feeding supplement) PROSource Plus  30 mL Oral BID BM  . docusate sodium  100 mg Oral BID  . enoxaparin (LOVENOX) injection  80 mg Subcutaneous Q24H  . furosemide  40 mg Oral BID  . insulin aspart  0-15 Units Subcutaneous TID WC  . insulin aspart  0-5 Units Subcutaneous QHS  . losartan  100 mg Oral Daily  .  morphine injection  4 mg Intravenous Once  . multivitamin with minerals  1 tablet Oral Daily  . pantoprazole  40 mg Oral Daily  . sodium chloride flush  3 mL Intravenous Q12H  . triamcinolone 0.1 % cream : eucerin   Topical BID   Continuous Infusions:  PRN Meds: acetaminophen **OR** acetaminophen, albuterol, bisacodyl, clonazePAM, cyclobenzaprine, dextromethorphan-guaiFENesin, hydrALAZINE, HYDROmorphone (DILAUDID) injection, ondansetron **OR** ondansetron (ZOFRAN) IV, oxyCODONE, polyethylene glycol, rOPINIRole  Allergies:    Allergies  Allergen Reactions  . Amoxicillin-Pot Clavulanate Nausea And Vomiting    Projectile vomiting Did it involve swelling of the face/tongue/throat, SOB, or low BP? No Did it involve sudden or severe rash/hives, skin peeling, or any reaction on the inside of your mouth or nose? No Did you need to seek medical attention at a hospital or doctor's office? No When did it last happen?20-30 years If all above answers are "NO", may proceed with cephalosporin use.   . Adhesive [Tape] Other (See Comments)    Tears skin off - use paper tape   . Codeine Other (See Comments)    hallucinations  . Crestor [Rosuvastatin Calcium]     Severe muscle cramps  . Lactose Intolerance (Gi) Nausea And Vomiting  . Morphine And Related Nausea And Vomiting and Other (See Comments)    Migraine headaches  . Vicodin  [Hydrocodone-Acetaminophen] Other (See Comments)    hallucinations  . Latex Rash    Social History:   Social History   Socioeconomic History  . Marital status: Divorced    Spouse name: Not on file  . Number of children: 2  . Years of education: Not on file  . Highest education level: Not on file  Occupational History  . Occupation: disabled  Tobacco Use  . Smoking status: Former Smoker    Years: 30.00    Quit date: 11/08/2008    Years since  quitting: 12.4  . Smokeless tobacco: Never Used  . Tobacco comment: quit 10/09  Vaping Use  . Vaping Use: Never used  Substance and Sexual Activity  . Alcohol use: No    Comment: quit drinking 10/07/1997  . Drug use: No  . Sexual activity: Not Currently    Birth control/protection: Surgical, Abstinence  Other Topics Concern  . Not on file  Social History Narrative   Patient has difficulty financially affording food, medication, and affording essential bills. She states she has limited support from her daughter.    Social Determinants of Health   Financial Resource Strain: Medium Risk  . Difficulty of Paying Living Expenses: Somewhat hard  Food Insecurity: Food Insecurity Present  . Worried About Charity fundraiser in the Last Year: Sometimes true  . Ran Out of Food in the Last Year: Sometimes true  Transportation Needs: No Transportation Needs  . Lack of Transportation (Medical): No  . Lack of Transportation (Non-Medical): No  Physical Activity: Inactive  . Days of Exercise per Week: 0 days  . Minutes of Exercise per Session: 0 min  Stress: No Stress Concern Present  . Feeling of Stress : Not at all  Social Connections: Unknown  . Frequency of Communication with Friends and Family: More than three times a week  . Frequency of Social Gatherings with Friends and Family: More than three times a week  . Attends Religious Services: Patient refused  . Active Member of Clubs or Organizations: Patient refused  . Attends Theatre manager Meetings: Patient refused  . Marital Status: Never married  Intimate Partner Violence: Not At Risk  . Fear of Current or Ex-Partner: No  . Emotionally Abused: No  . Physically Abused: No  . Sexually Abused: No    Family History:    Family History  Problem Relation Age of Onset  . Heart disease Mother   . Hypertension Mother   . Diabetes Mother   . Heart failure Mother   . Asthma Sister   . Anxiety disorder Sister   . Depression Sister   . Hypertension Father   . Asthma Daughter   . Bipolar disorder Daughter   . Cancer Maternal Uncle        [place]  . Cancer Other        ovarian  . Liver cancer Paternal Grandmother        ????     ROS:  Please see the history of present illness.   All other ROS reviewed and negative.     Physical Exam/Data:   Vitals:   04/08/21 0029 04/08/21 0407 04/08/21 0434 04/08/21 0806  BP:  (!) 111/48 (!) 122/45 (!) 142/77  Pulse:  84 82 86  Resp:  '14 18 18  ' Temp:  98 F (36.7 C) 97.6 F (36.4 C) 97.9 F (36.6 C)  TempSrc:  Oral Oral Oral  SpO2:  95% 97% 99%  Weight: (!) 162.9 kg     Height:        Intake/Output Summary (Last 24 hours) at 04/08/2021 1408 Last data filed at 04/08/2021 1400 Gross per 24 hour  Intake 894.75 ml  Output 3250 ml  Net -2355.25 ml   Last 3 Weights 04/08/2021 04/05/2021 04/01/2021  Weight (lbs) 359 lb 2.1 oz 366 lb 362 lb  Weight (kg) 162.9 kg 166.017 kg 164.202 kg     Body mass index is 59.76 kg/m.  General:  Morbidly obese female sitting in bedside chair in NAD HEENT: sclera anicteric  Lymph: no adenopathy Neck: no JVD Endocrine:  No thryomegaly Vascular: No carotid bruits; distal pulses 2+ bilaterally Cardiac:  normal S1, S2; RRR; no murmurs, rubs, or gallops Lungs:  clear to auscultation bilaterally, no wheezing, rhonchi or rales  Abd: soft, obese, nontender, no hepatomegaly  Ext: trace LE edema; chronic venous stasis skin changes Musculoskeletal:  No deformities, BUE and BLE strength  normal and equal Skin: warm and dry  Neuro:  CNs 2-12 intact, no focal abnormalities noted Psych:  Normal affect   EKG:  The EKG was personally reviewed and demonstrates:  Sinus rhythm, rate 83 bpm, non-specific IVCD, no STE/D, no significant change from previous Telemetry:  Telemetry was personally reviewed and demonstrates:  Not on telemetry at this time.   Relevant CV Studies: Echocardiogram 02/2021: 1. Left ventricular ejection fraction, by estimation, is 60 to 65%. The  left ventricle has normal function. The left ventricle has no regional  wall motion abnormalities. Left ventricular diastolic parameters are  consistent with Grade I diastolic  dysfunction (impaired relaxation). Elevated left ventricular end-diastolic  pressure.  2. Right ventricular systolic function is normal. The right ventricular  size is normal. Tricuspid regurgitation signal is inadequate for assessing  PA pressure.  3. The mitral valve is normal in structure. No evidence of mitral valve  regurgitation. No evidence of mitral stenosis.  4. The aortic valve is normal in structure. Aortic valve regurgitation is  not visualized. No aortic stenosis is present.  5. The inferior vena cava is normal in size with greater than 50%  respiratory variability, suggesting right atrial pressure of 3 mmHg.   Laboratory Data:  High Sensitivity Troponin:   Recent Labs  Lab 04/01/21 1410 04/01/21 1515 04/05/21 2013  TROPONINIHS 3 <2 6     Chemistry Recent Labs  Lab 04/07/21 0319 04/07/21 1816 04/08/21 0342  NA 134* 134* 136  K 2.5* 4.4 2.8*  CL 88* 90* 92*  CO2 36* 30 37*  GLUCOSE 113* 135* 123*  BUN '17 23 23  ' CREATININE 1.01* 1.30* 1.14*  CALCIUM 8.7* 8.6* 8.4*  GFRNONAA >60 46* 54*  ANIONGAP '10 14 7    ' Recent Labs  Lab 04/01/21 1410 04/08/21 0342  PROT 7.5 5.9*  ALBUMIN 4.0 3.3*  AST 22 20  ALT 21 19  ALKPHOS 72 57  BILITOT 0.9 1.0   Hematology Recent Labs  Lab 04/01/21 1410  04/05/21 2013 04/07/21 0319  WBC 9.7 9.7 7.3  RBC 4.09 4.00 3.80*  HGB 12.4 11.9* 11.7*  HCT 37.6 35.9* 34.0*  MCV 91.9 89.8 89.5  MCH 30.3 29.8 30.8  MCHC 33.0 33.1 34.4  RDW 12.9 12.5 12.5  PLT 281 262 244   BNP Recent Labs  Lab 04/01/21 1411 04/05/21 2019  BNP 16.4 13.2    DDimer No results for input(s): DDIMER in the last 168 hours.   Radiology/Studies:  DG Chest 2 View  Result Date: 04/05/2021 CLINICAL DATA:  Shortness of breath EXAM: CHEST - 2 VIEW COMPARISON:  04/01/2021 FINDINGS: Heart is normal size. No confluent airspace opacities or effusions. No acute bony abnormality. IMPRESSION: No active cardiopulmonary disease. Electronically Signed   By: Rolm Baptise M.D.   On: 04/05/2021 20:43     Assessment and Plan:   1. Chronic diastolic CHF: patient has had multiple presentations to the ED/Hospital for evaluation of weight gain, DOE, and LE edema. Admitted 02/2021 for IV diuresis, followed by ED visit 04/01/21 where she received a dose of IV lasix and was discharged home  on increased po lasix x3 days. Again returned 04/05/21 with volume overload complaints and muscle cramping. Echo 02/2021 showed EF 60-65%, G1DD, no RWMA, elevated LVEDP, and no significant valvular abnormalities. This admission, EKG was non-ischemic, HsTrop negative, BNP wnl, CXR without acute findings. She was felt to possibly be overdiuresed on admission given slight bump in Cr. Hospital course has been complicated by electrolyte imbalance with severe hypokalemia (as low as 2.5), Na 129. Cardiology asked to weight in on med management. Volume status is quite difficult to gauge given obesity. Suspect there is a component of OSA/OHS playing a roll in her DOE and chronic venous stasis issues contributing to her LE edema. She has trace LE edema and clear lungs on exam today.   - Will add metoprolol tartrate 61m BID - Continue losartan - Continue to monitor strict I&Os and daily weights - Continue to monitor  electrolytes and replete as needed to maintain K >4, Mg >2.  2. Chest pressure in patient with coronary artery calcifications on CT scan: notes rare episodes of chest pressure with prolonged exertion. She does have significant family history of CAD (mother with CABG in her 552s, as well as several risk factors including HTN, HLD, DM type 2, and obesity. HsTrop negative this admission and EKG is non-ischemic - Consider outpatient ischemic evaluation.   3. HTN: BP generally stable - Continue metoprolol and losartan as above  4. HLD: LDL 83 12/2020; goal <70. She has made significant dietary changes since that time.  - Continue to encourage dietary/lifestyle modifications to promote weight loss  5. DM type 2: A1C 6.5 this admission - Could consider SGLT-2 inhibitor  - Continue management per primary team  6. Suspected OSA: reports sleep study 1 year ago did not show OSA - do not see record of this, however study in 2019 suggested mild OSA. Possible this has progressed.  - May benefit from a repeat sleep study   7. Morbid obesity: likely contributing to her DOE.  - Continue to encourage dietary/lifestyle modifications to promote weight loss   Risk Assessment/Risk Scores:   New York Heart Association (NYHA) Functional Class NYHA Class III  For questions or updates, please contact [facility] Please consult [email or web address] for contact info under    Signed, KAbigail Butts PA-C  04/08/2021 2:08 PM   History and all data above reviewed.  Patient examined.  I agree with the findings as above.  Presents predominantly with leg cramping.  This has been waking her up in the morning.  She was also here because of increased lower extremity swelling.  She said when she left the hospital after the last visit as above she has lost about 17 pounds.  However, she gained about 9 of this back.  This was despite the fact that she does not use salt.  She limits her fluid intake.  She tries to keep her  feet elevated.  She was taking the diuretics as described.  She has chronic dyspnea with exertion but does not really think this was a predominant complaint on admission.  She has not been having any new chest pressure, neck or arm discomfort.  She has not had any new palpitations, presyncope or syncope.  She gets around slowly in her small apartment.  She apparently does not really get out of the house very much.  She is very limited by chronic pain including chronic back pain and does have a pain specialist.   Initially on presentation she was given IV Lasix  and had a significant drop in her potassium.  This was helped but she is today but started back on p.o. diuretic.  We are asked to help with med management for her acute on chronic diastolic dysfunction the patient exam reveals COR: Regular rate and rhythm,  Lungs: Clear to auscultation bilaterally,  Abd: Positive bowel sounds normal in frequency and pitch, Ext mild bilateral lower extremity swelling with chronic venous stasis changes on the left greater than right.  All available labs, radiology testing, previous records reviewed. Agree with documented assessment and plan.  Acute on chronic diastolic heart failure: The patient's weight is up and she is confounded as to why.  However, on careful questioning I suspect she has more sodium content in her food as some of it is prepackaged and delivered also by [employer].  At this point I probably would switch to [facility] to see if she has a better response.  I think really the key is going to be close post hospital follow-up both in our [facility] and as a new patient with me.  One of the issues will be mobility which will reduce her in person visits.  She has good transportation and ambulation is difficult.  However, she has had home health nursing available to do blood draws in the past this would be helpful.  Today we discussed salt and fluid restriction.  She can continue current ARB.  We  have added a beta-blocker.  She might also be a good candidate for SGLT2 inhibitor.   Jeneen Rinks Yatziry Deakins  5:56 PM  04/08/2021

## 2021-04-08 NOTE — Progress Notes (Signed)
PROGRESS NOTE    Maria [name]  HYI:[id number] DOB: [birth year] DOA: 04/05/2021 PCP: Biagio Borg, MD   Brief Narrative:  65 year old with history of chronic low back pain, HTN, DM2, HLD, PVD, diastolic CHF admitted for shortness of breath and weight gain discharged on Lasix now presenting again with shortness of breath, lower extremity swelling and laboratory abnormality.   Assessment & Plan:   Principal Problem:   AKI (acute kidney injury) ([facility]) Active Problems:   Benign hypertension   Chronic low back pain   Morbid obesity (HCC)   Venous stasis dermatitis of both lower extremities   HLD (hyperlipidemia)   Diastolic congestive heart failure (HCC)   Edema  Severe hypokalemia, 2.8 - Recurrent hypokalemia secondary to diuretic use.  IV n.p.o. aggressive repletion ordered.  Once this stabilizes with aggressive repletion, she will likely need to be on home daily oral potassium supplement with her diuretics.  Acute kidney injury, improved -Baseline creatinine 1.0.  Admission creatinine 1.4.  Creatinine today is 1.1.  Slowly improving.  Lasix 40 mg twice daily  Chronic diastolic CHF -Echo 06/8675-HM 60 to 65%, grade 1 DD -Resume Lasix 40 mg twice daily - Losartan 100 mg daily -We will consult cardiology for diuretic management and transition to outpatient follow-up.  Stasis dermatitis Bilateral lower extremity swelling -Secondary to chronic lower extremity swelling.  Advise ambulation.  Recently was on Keflex but stopped on admission due to no evidence of infection. - Elevate legs  HTN -Losartan 100 mg daily  Chronic pain -Continue home regimen of Klonopin, Flexeril, oxycodone, Dilaudid and Requip use as needed.  Bowel regimen.  Hyperlipidemia -Not on any home medications  Diabetes mellitus type 2 -A1c 6.5.  Holding metformin - Insulin sliding scale and Accu-Chek.  Obesity -Body mass index is 60.91 kg/m..  -Weight loss should be encouraged -Mobility for her  is key or she will lose her ability to mobilize soon -Outpatient PCP/bariatric medicine f/u encouraged    DVT prophylaxis: Lovenox Code Status: Full code Family Communication:    Dispo: The patient is from: Home              Anticipated d/c is to: Home              Patient currently is not medically stable to d/c.  Recurrent hypokalemia and spite of aggressive repletion.  Continue aggressive IV repletion today.  Once this stabilizes she can go home on oral supplements as she is on aggressive diuretics.   Difficult to place patient No   Nutritional status  Nutrition Problem: Increased nutrient needs Etiology: chronic illness (CHF)  Signs/Symptoms: estimated needs  Interventions: MVI,Premier Protein,Refer to RD note for recommendations  Body mass index is 59.76 kg/m.       Subjective: Seen and examined at bedside, still feels slightly weak but better compared to yesterday.  Review of Systems Otherwise negative except as per HPI, including: General: Denies fever, chills, night sweats or unintended weight loss. Resp: Denies cough, wheezing, shortness of breath. Cardiac: Denies chest pain, palpitations, orthopnea, paroxysmal nocturnal dyspnea. GI: Denies abdominal pain, nausea, vomiting, diarrhea or constipation GU: Denies dysuria, frequency, hesitancy or incontinence MS: Denies muscle aches, joint pain or swelling Neuro: Denies headache, neurologic deficits (focal weakness, numbness, tingling), abnormal gait Psych: Denies anxiety, depression, SI/HI/AVH Skin: Denies new rashes or lesions ID: Denies sick contacts, exotic exposures, travel Examination: Constitutional: Not in acute distress, morbid obesity Respiratory: Diminished breath sounds at the bases Cardiovascular: Normal sinus rhythm, no rubs Abdomen: Nontender  nondistended good bowel sounds Musculoskeletal: 2+ bilateral lower extremity pitting edema Skin: No rashes seen Neurologic: CN 2-12 grossly intact.  And  nonfocal Psychiatric: Normal judgment and insight. Alert and oriented x 3. Normal mood.   Objective: Vitals:   04/08/21 0029 04/08/21 0407 04/08/21 0434 04/08/21 0806  BP:  (!) 111/48 (!) 122/45 (!) 142/77  Pulse:  84 82 86  Resp:  14 18 18   Temp:  98 F (36.7 C) 97.6 F (36.4 C) 97.9 F (36.6 C)  TempSrc:  Oral Oral Oral  SpO2:  95% 97% 99%  Weight: (!) 162.9 kg     Height:        Intake/Output Summary (Last 24 hours) at 04/08/2021 0819 Last data filed at 04/08/2021 0435 Gross per 24 hour  Intake 1014.75 ml  Output 2550 ml  Net -1535.25 ml   Filed Weights   04/05/21 2007 04/08/21 0029  Weight: (!) 166 kg (!) 162.9 kg     Data Reviewed:   CBC: Recent Labs  Lab 04/01/21 1410 04/05/21 2013 04/07/21 0319  WBC 9.7 9.7 7.3  NEUTROABS 5.6  --   --   HGB 12.4 11.9* 11.7*  HCT 37.6 35.9* 34.0*  MCV 91.9 89.8 89.5  PLT 281 262 161   Basic Metabolic Panel: Recent Labs  Lab 04/01/21 1410 04/05/21 2013 04/06/21 0731 04/07/21 0319 04/07/21 1816 04/08/21 0342  NA 135 129*  --  134* 134* 136  K 3.0* 2.8*  --  2.5* 4.4 2.8*  CL 90* 81*  --  88* 90* 92*  CO2 34* 34*  --  36* 30 37*  GLUCOSE 111* 132*  --  113* 135* 123*  BUN 23 24*  --  17 23 23   CREATININE 1.11* 1.41*  --  1.01* 1.30* 1.14*  CALCIUM 9.1 8.3*  --  8.7* 8.6* 8.4*  MG  --   --  1.7 2.1  --  1.9  PHOS  --   --  3.2  --   --   --    GFR: Estimated Creatinine Clearance: 79.3 mL/min (A) (by C-G formula based on SCr of 1.14 mg/dL (H)). Liver Function Tests: Recent Labs  Lab 04/01/21 1410 04/08/21 0342  AST 22 20  ALT 21 19  ALKPHOS 72 57  BILITOT 0.9 1.0  PROT 7.5 5.9*  ALBUMIN 4.0 3.3*   No results for input(s): LIPASE, AMYLASE in the last 168 hours. No results for input(s): AMMONIA in the last 168 hours. Coagulation Profile: Recent Labs  Lab 04/01/21 1410  INR 1.0   Cardiac Enzymes: No results for input(s): CKTOTAL, CKMB, CKMBINDEX, TROPONINI in the last 168 hours. BNP (last 3  results) No results for input(s): PROBNP in the last 8760 hours. HbA1C: No results for input(s): HGBA1C in the last 72 hours. CBG: Recent Labs  Lab 04/07/21 0700 04/07/21 1115 04/07/21 1533 04/07/21 2046 04/08/21 0531  GLUCAP 117* 98 108* 146* 109*   Lipid Profile: No results for input(s): CHOL, HDL, LDLCALC, TRIG, CHOLHDL, LDLDIRECT in the last 72 hours. Thyroid Function Tests: No results for input(s): TSH, T4TOTAL, FREET4, T3FREE, THYROIDAB in the last 72 hours. Anemia Panel: No results for input(s): VITAMINB12, FOLATE, FERRITIN, TIBC, IRON, RETICCTPCT in the last 72 hours. Sepsis Labs: No results for input(s): PROCALCITON, LATICACIDVEN in the last 168 hours.  Recent Results (from the past 240 hour(s))  SARS CORONAVIRUS 2 (TAT 6-24 HRS) Nasopharyngeal Nasopharyngeal Swab     Status: None   Collection Time: 04/06/21  6:56 PM  Specimen: Nasopharyngeal Swab  Result Value Ref Range Status   SARS Coronavirus 2 NEGATIVE NEGATIVE Final    Comment: (NOTE) SARS-CoV-2 target nucleic acids are NOT DETECTED.  The SARS-CoV-2 RNA is generally detectable in upper and lower respiratory specimens during the acute phase of infection. Negative results do not preclude SARS-CoV-2 infection, do not rule out co-infections with other pathogens, and should not be used as the sole basis for treatment or other patient management decisions. Negative results must be combined with clinical observations, patient history, and epidemiological information. The expected result is Negative.  Fact Sheet for Patients: SugarRoll.be  Fact Sheet for Healthcare Providers: https://www.woods-mathews.com/  This test is not yet approved or cleared by the [place] FDA and  has been authorized for detection and/or diagnosis of SARS-CoV-2 by FDA under an Emergency Use Authorization (EUA). This EUA will remain  in effect (meaning this test can be used) for the duration  of the COVID-19 declaration under Se ction 564(b)(1) of the Act, 21 U.S.C. section 360bbb-3(b)(1), unless the authorization is terminated or revoked sooner.  Performed at [facility], [place] [address] [place], [place] [address]          Radiology Studies: No results found.      Scheduled Meds: . (feeding supplement) PROSource Plus  30 mL Oral BID BM  . docusate sodium  100 mg Oral BID  . enoxaparin (LOVENOX) injection  80 mg Subcutaneous Q24H  . furosemide  40 mg Oral BID  . insulin aspart  0-15 Units Subcutaneous TID WC  . insulin aspart  0-5 Units Subcutaneous QHS  . losartan  100 mg Oral Daily  .  morphine injection  4 mg Intravenous Once  . multivitamin with minerals  1 tablet Oral Daily  . pantoprazole  40 mg Oral Daily  . potassium chloride  40 mEq Oral Q4H  . sodium chloride flush  3 mL Intravenous Q12H  . triamcinolone 0.1 % cream : eucerin   Topical BID   Continuous Infusions: . potassium chloride       LOS: 1 day   Time spent= 35 mins    Shley Dolby Arsenio Loader, MD [facility]  If 7PM-7AM, please contact night-coverage  04/08/2021, 8:19 AM

## 2021-04-08 NOTE — Progress Notes (Signed)
Occupational Therapy Treatment Patient Details Name: Maria [name] MRN: [id number] DOB: [birth year] Today's Date: 04/08/2021    History of present illness 65 y.o. female admitted with CHF exacerbation. PMH: chronic low back pain; HTN; HLD; DM; PVD; and chronic diastolic CHF presenting with SOB.   OT comments  Pt making steady progress towards OT goals this session. Session focus on education related to LB AE for bathing and dressing and managing CHF at home. Pt continues to present with increased pain ( low back pain), decreased activity tolerance and generalized deconditioning. Discussed compensatory methods for completing BADLs and strategies for managing CHF at home. Pt able to recall one strategy that she already uses at home ( weighing every morning and tracking her weight). Issued pt handout on other strategies to manage CHF at home with pt very appreciative. Pt currently requires MOD A for LB ADLs from recliner with AE. Pt would continue to benefit from skilled occupational therapy while admitted and after d/c to address the below listed limitations in order to improve overall functional mobility and facilitate independence with BADL participation. DC plan remains appropriate, will follow acutely per POC.     Follow Up Recommendations  Home health OT    Equipment Recommendations  None recommended by OT    Recommendations for Other Services      Precautions / Restrictions Precautions Precautions: Fall Restrictions Weight Bearing Restrictions: No       Mobility Bed Mobility               General bed mobility comments: pt OOB in recliner and left up in recliner at end of sesssion    Transfers                 General transfer comment: session conducted from recliner    Balance                                           ADL either performed or assessed with clinical judgement   ADL Overall ADL's : Needs assistance/impaired                Lower Body Bathing Details (indicate cue type and reason): education provided on using LH sponge for LB bathing as energy conservation strategy     Lower Body Dressing: Moderate assistance;Cueing for sequencing;With adaptive equipment Lower Body Dressing Details (indicate cue type and reason): to don sock from EOB with sock aid, cues to sequence novel task       Toileting - Clothing Manipulation Details (indicate cue type and reason): education and demo provided on using toileting tongs for posterior pericare   Tub/Shower Transfer Details (indicate cue type and reason): pt reports shower seat at home   General ADL Comments: session focus on education related to managing CHF at home and education related to AE for LB ADLs as pt present with chronic back pain. Pt very pleasant and receptive to all education     Vision       Perception     Praxis      Cognition Arousal/Alertness: Awake/alert Behavior During Therapy: [facility] for tasks assessed/performed;Anxious (moment of anxiety and tearful when talking about CHF) Overall Cognitive Status: Within Functional Limits for tasks assessed  General Comments: overall WFL but emotional when discussing CHF and managing CHF at home, but very appreciative of education        Exercises     Shoulder Instructions       General Comments issued pt edcuation on "CHF excerbation" and "compleing ADLs with CHF", additionally issued pt wide sock aid, reacher, LH sponge and toilet tongs    Pertinent Vitals/ Pain       Pain Assessment: Faces Faces Pain Scale: Hurts little more Pain Location: lower back Pain Descriptors / Indicators: Discomfort;Sore Pain Intervention(s): Monitored during session;Premedicated before session  Home Living                                          Prior Functioning/Environment              Frequency  Min 2X/week        Progress Toward  Goals  OT Goals(current goals can now be found in the care plan section)  Progress towards OT goals: Progressing toward goals  Acute Rehab OT Goals Patient Stated Goal: to increase education on CHF OT Goal Formulation: With patient Time For Goal Achievement: 04/21/21 Potential to Achieve Goals: Good  Plan Discharge plan remains appropriate;Frequency remains appropriate    Co-evaluation                 AM-PAC OT "6 Clicks" Daily Activity     Outcome Measure   Help from another person eating meals?: None Help from another person taking care of personal grooming?: A Little Help from another person toileting, which includes using toliet, bedpan, or urinal?: A Little Help from another person bathing (including washing, rinsing, drying)?: A Little Help from another person to put on and taking off regular upper body clothing?: A Little Help from another person to put on and taking off regular lower body clothing?: A Lot 6 Click Score: 18    End of Session Equipment Utilized During Treatment: Other (comment) (LB AE)  OT Visit Diagnosis: Unsteadiness on feet (R26.81);Muscle weakness (generalized) (M62.81);Pain Pain - part of body:  (lower back)   Activity Tolerance Patient tolerated treatment well   Patient Left in chair;with call bell/phone within reach   Nurse Communication Mobility status;Other (comment) (requesting half cup of ice)        Time: 0102-7253 OT Time Calculation (min): 32 min  Charges: OT General Charges $OT Visit: 1 Visit OT Treatments $Self Care/Home Management : 23-37 mins  Harley Alto., COTA/L Acute Rehabilitation Services [phone]    Precious Haws 04/08/2021, 9:19 AM

## 2021-04-09 ENCOUNTER — Inpatient Hospital Stay (HOSPITAL_COMMUNITY): Payer: Medicare Other

## 2021-04-09 DIAGNOSIS — N179 Acute kidney failure, unspecified: Secondary | ICD-10-CM | POA: Diagnosis not present

## 2021-04-09 DIAGNOSIS — I5032 Chronic diastolic (congestive) heart failure: Secondary | ICD-10-CM | POA: Diagnosis not present

## 2021-04-09 DIAGNOSIS — M7989 Other specified soft tissue disorders: Secondary | ICD-10-CM

## 2021-04-09 LAB — COMPREHENSIVE METABOLIC PANEL
ALT: 26 U/L (ref 0–44)
AST: 27 U/L (ref 15–41)
Albumin: 3.5 g/dL (ref 3.5–5.0)
Alkaline Phosphatase: 51 U/L (ref 38–126)
Anion gap: 8 (ref 5–15)
BUN: 21 mg/dL (ref 8–23)
CO2: 34 mmol/L — ABNORMAL HIGH (ref 22–32)
Calcium: 8.4 mg/dL — ABNORMAL LOW (ref 8.9–10.3)
Chloride: 97 mmol/L — ABNORMAL LOW (ref 98–111)
Creatinine, Ser: 1.16 mg/dL — ABNORMAL HIGH (ref 0.44–1.00)
GFR, Estimated: 53 mL/min — ABNORMAL LOW (ref >60)
Glucose, Bld: 101 mg/dL — ABNORMAL HIGH (ref 70–99)
Potassium: 3.6 mmol/L (ref 3.5–5.1)
Sodium: 139 mmol/L (ref 135–145)
Total Bilirubin: 0.6 mg/dL (ref 0.3–1.2)
Total Protein: 6.3 g/dL — ABNORMAL LOW (ref 6.5–8.1)

## 2021-04-09 LAB — GLUCOSE, CAPILLARY
Glucose-Capillary: 78 mg/dL (ref 70–99)
Glucose-Capillary: 117 mg/dL — ABNORMAL HIGH (ref 70–99)
Glucose-Capillary: 129 mg/dL — ABNORMAL HIGH (ref 70–99)
Glucose-Capillary: 120 mg/dL — ABNORMAL HIGH (ref 70–99)

## 2021-04-09 LAB — MAGNESIUM: Magnesium: 1.8 mg/dL (ref 1.7–2.4)

## 2021-04-09 MED ORDER — POTASSIUM CHLORIDE CRYS ER 20 MEQ PO TBCR
20.0000 meq | EXTENDED_RELEASE_TABLET | Freq: Two times a day (BID) | ORAL | Status: DC
  Administered 2021-04-09 – 2021-04-11 (×5): 20 meq via ORAL
  Filled 2021-04-09 (×5): qty 1

## 2021-04-09 MED ORDER — LIVING BETTER WITH HEART FAILURE BOOK: Freq: Once | Status: AC

## 2021-04-09 MED ORDER — MAGNESIUM OXIDE 400 (241.3 MG) MG PO TABS
400.0000 mg | ORAL_TABLET | Freq: Every day | ORAL | Status: DC
  Administered 2021-04-09 – 2021-04-11 (×3): 400 mg via ORAL
  Filled 2021-04-09 (×3): qty 1

## 2021-04-09 MED ORDER — POLYETHYLENE GLYCOL 3350 17 G PO PACK
17.0000 g | PACK | Freq: Two times a day (BID) | ORAL | Status: DC
  Administered 2021-04-09 – 2021-04-10 (×3): 17 g via ORAL
  Filled 2021-04-09 (×2): qty 1

## 2021-04-09 NOTE — Progress Notes (Addendum)
Progress Note  Patient Name: Maria [name] Date of Encounter: 04/09/2021   Primary Cardiologist: Seen by Dr. Percival Spanish this admission as a new consult, but pending referral to Dr. Gwenlyn Found (will clarify with MD plan for primary cardiologist)  Subjective   FYI: Last name pronounced [place], not the organ.  Patient reports her breathing and edema remain improved. She is concerned she has had trouble having a BM since Friday. Also very concerned about making sure her potassium remains normal over the next 24 hours.  Inpatient Medications    Scheduled Meds: . (feeding supplement) PROSource Plus  30 mL Oral BID BM  . docusate sodium  100 mg Oral BID  . enoxaparin (LOVENOX) injection  80 mg Subcutaneous Q24H  . insulin aspart  0-15 Units Subcutaneous TID WC  . insulin aspart  0-5 Units Subcutaneous QHS  . losartan  100 mg Oral Daily  . metoprolol tartrate  25 mg Oral BID  .  morphine injection  4 mg Intravenous Once  . multivitamin with minerals  1 tablet Oral Daily  . pantoprazole  40 mg Oral Daily  . sodium chloride flush  3 mL Intravenous Q12H  . torsemide  40 mg Oral BID  . triamcinolone 0.1 % cream : eucerin   Topical BID   Continuous Infusions:  PRN Meds: acetaminophen **OR** acetaminophen, albuterol, bisacodyl, clonazePAM, cyclobenzaprine, dextromethorphan-guaiFENesin, hydrALAZINE, HYDROmorphone (DILAUDID) injection, ondansetron **OR** ondansetron (ZOFRAN) IV, oxyCODONE, polyethylene glycol, rOPINIRole   Vital Signs    Vitals:   04/08/21 0806 04/08/21 2046 04/09/21 0007 04/09/21 0410  BP: (!) 142/77 (!) 129/58  (!) 124/53  Pulse: 86 78  66  Resp: 18 20  20   Temp: 97.9 F (36.6 C) (!) 97.3 F (36.3 C)  98.5 F (36.9 C)  TempSrc: Oral Oral    SpO2: 99% 95%  96%  Weight:   (!) 163.5 kg   Height:        Intake/Output Summary (Last 24 hours) at 04/09/2021 0953 Last data filed at 04/09/2021 0950 Gross per 24 hour  Intake 779.86 ml  Output 3950 ml  Net -3170.14 ml    Last 3 Weights 04/09/2021 04/08/2021 04/05/2021  Weight (lbs) 360 lb 6.4 oz 359 lb 2.1 oz 366 lb  Weight (kg) 163.476 kg 162.9 kg 166.017 kg     Telemetry    N/A - Personally Reviewed  Physical Exam   GEN: No acute distress. Body habitus c/w morbid obesity HEENT: Normocephalic, atraumatic, sclera non-icteric. Neck: No JVD or bruits. Cardiac: RRR no murmurs, rubs, or gallops.  Respiratory: Clear to auscultation bilaterally. Breathing is unlabored. GI: Soft, nontender, non-distended, BS +x 4. MS: no deformity. Extremities: No clubbing or cyanosis. No edema - large baseline leg habitus. Distal pedal pulses are 2+ and equal bilaterally. Neuro:  AAOx3. Follows commands. Psych:  Responds to questions appropriately with a normal affect.  Labs    High Sensitivity Troponin:   Recent Labs  Lab 04/01/21 1410 04/01/21 1515 04/05/21 2013  TROPONINIHS 3 <2 6      Cardiac EnzymesNo results for input(s): TROPONINI in the last 168 hours. No results for input(s): TROPIPOC in the last 168 hours.   Chemistry Recent Labs  Lab 04/07/21 1816 04/08/21 0342 04/09/21 0320  NA 134* 136 139  K 4.4 2.8* 3.6  CL 90* 92* 97*  CO2 30 37* 34*  GLUCOSE 135* 123* 101*  BUN 23 23 21   CREATININE 1.30* 1.14* 1.16*  CALCIUM 8.6* 8.4* 8.4*  PROT  --  5.9* 6.3*  ALBUMIN  --  3.3* 3.5  AST  --  20 27  ALT  --  19 26  ALKPHOS  --  57 51  BILITOT  --  1.0 0.6  GFRNONAA 46* 54* 53*  ANIONGAP 14 7 8      Hematology Recent Labs  Lab 04/05/21 2013 04/07/21 0319  WBC 9.7 7.3  RBC 4.00 3.80*  HGB 11.9* 11.7*  HCT 35.9* 34.0*  MCV 89.8 89.5  MCH 29.8 30.8  MCHC 33.1 34.4  RDW 12.5 12.5  PLT 262 244    BNP Recent Labs  Lab 04/05/21 2019  BNP 13.2     DDimer No results for input(s): DDIMER in the last 168 hours.   Radiology    No results found.  Cardiac Studies   2D echo 03/08/21 1. Left ventricular ejection fraction, by estimation, is 60 to 65%. The left ventricle has normal  function. The left ventricle has no regional wall motion abnormalities. Left ventricular diastolic parameters are consistent with Grade I diastolic dysfunction (impaired relaxation). Elevated left ventricular end-diastolic  pressure.  2. Right ventricular systolic function is normal. The right ventricular size is normal. Tricuspid regurgitation signal is inadequate for assessing PA pressure.  3. The mitral valve is normal in structure. No evidence of mitral valve regurgitation. No evidence of mitral stenosis.  4. The aortic valve is normal in structure. Aortic valve regurgitation is not visualized. No aortic stenosis is present.  5. The inferior vena cava is normal in size with greater than 50% respiratory variability, suggesting right atrial pressure of 3 mmHg.   Patient Profile     65 y.o. female with chronic diastolic CHF, HTN, HLD, DM type 2, chronic pain, reported PVD, super morbid obesity, fibromyalgia, restless legs, mild OSA and chronic back pain. Recently admitted 02/2021 for reported acute on chronic diastolic CHF with weight gain/edema. Seen in the ED back for similar symptoms 04/01/21. Returned back tot he ER 04/06/21 with leg cramping, SOB, weight gain, swelling and edema. Labs revealed hyponatremia (Na 129), hypokalemia (2.8), AKI (Cr 1.4 from baseline 1.). She has not followed with cardiology outpatient, though is scheduled to see Dr. Gwenlyn Found 05/13/21 to establish care. Her last echocardiogram 03/08/21 showed EF 60-65%, G1DD, no RWMA, elevated LVEDP, and no significant valvular abnormalities. Does not appear to have had prior ischemic testing though does have evidence of prior coronary artery calcifications (specifically LAD) noted on prior CTA Chest in 2020.   Assessment & Plan    1. Chronic diastolic CHF - volume status is quite difficult to gauge given obesity but lungs are clear and she has no significant edema on exam (suspect current size of limbs is proportionate to her level of  obesity)  - suspect there is a component of OSA/OHS playing a roll in her DOE and chronic venous stasis issues contributing to her LE edema - felt to be overdiuresed prior to admission (weights extremely variable in EMR - most recent peak 373lb in 02/2021, discharged at 357lb and at 360lb today - Lasix changed to torsemide 40mg  BID last night - she feels good on this regimen so will continue - regarding hypokalemia: will add KCl 17meq BID as standing order and f/u BMET in AM, add Mag Ox 400mg  daily as well for Mg 1.8 (may also help bowel function as well) - in the future if she has continued issues with DOE/fluid retention would benefit from a more definitive right heart cath   2. Chest pressure in patient  with coronary artery calcifications on CT scan - notes rare episodes of chest pressure with prolonged exertion. She does have significant family history of CAD (mother with CABG in her 60s), as well as several risk factors including HTN, HLD, DM type 2, and obesity. HsTrop negative this admission and EKG is non-ischemic - nuclear stress testing and coronary CTA would be challenging given body habitus - await MD input whether there is any role for OP ischemic testing at this time  3. Essential HTN - BP controlled on losartan, Lopressor, torsemide  4. Hyperlipidemia - LDL 83 12/2020; goal <70. She has made significant dietary changes since that time.  - continue to encourage dietary/lifestyle modifications to promote weight loss  5. Severe morbid obesity  - her medical problems and dyspnea will likely continue to be an issue if this is not addressed - consider referral to medical weight management as OP - consider Ozempic given obesity and DM  6. Suspected OSA - reports sleep study 1 year ago did not show OSA - do not see record of this, however study in 2019 suggested mild OSA. Possible this has progressed.  - may benefit from a repeat sleep study, can address as OP  7. 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 Patient does not wish to go home today - wants to f/u potassium level in AM and await BM. Will defer rx of constipation to IM.  For questions or updates, please contact [place] Please consult [email or web address] for contact info under Cardiology/STEMI.  Signed, Charlie Pitter, PA-C 04/09/2021, 9:53 AM    History and all data above reviewed. She is constipated.  No acute SOB.  Patient examined.  I agree with the findings as above.  The patient exam reveals COR:RRR  ,  Lungs: Clear  ,  Abd: Positive bowel sounds, no rebound no guarding, Ext No edema  .  All available labs, radiology testing, previous records reviewed. Agree with documented assessment and plan.   Acute diastolic HF:   Changed to Torsemide.  We need to try to arrange some home health lab draws as she is very immobile.    Jeneen Rinks Priscella Donna  11:07 AM  04/09/2021

## 2021-04-09 NOTE — Progress Notes (Signed)
PROGRESS NOTE    Maria [name]  VOJ:[id number] DOB: July 20, 1956 DOA: 04/05/2021 PCP: Biagio Borg, MD   Chief Complaint  Patient presents with  . Shortness of Breath  . Leg Swelling   Brief Narrative: 65 year old with history of chronic low back pain, HTN, DM2, HLD, PVD, diastolic CHF admitted for shortness of breath and weight gain discharged on Lasix now presenting again with shortness of breath, lower extremity swelling and laboratory abnormality.  Assessment & Plan:   Principal Problem:   AKI (acute kidney injury) ([place]) Active Problems:   Benign hypertension   Chronic low back pain   Morbid obesity (HCC)   Venous stasis dermatitis of both lower extremities   HLD (hyperlipidemia)   Diastolic congestive heart failure (HCC)   Edema  Severe hypokalemia, 2.8 - Recurrent hypokalemia secondary to diuretic use -- replace and follow, improved  -- needs supplementation with diuresis (per cardiology, adding kcl 20 meq BID)  Acute kidney injury, improved -Baseline creatinine 1.0.  Admission creatinine 1.4.   - improved, continue to monitor with diuresis   Chronic diastolic CHF -Echo 08/9370-IR 60 to 65%, grade 1 DD -Resume Lasix 40 mg twice daily -> transition to torsemide 40 mg twice daily - lostartan, metoprolol -We will consult cardiology for diuretic management and transition to outpatient follow-up.  Appreciate assistance.  May need right heart cath in the future.    Chest Pressure - per cardiology, outpatient follow up - negative troponin - denies any CP to me today  Stasis dermatitis Bilateral lower extremity swelling -Secondary to chronic lower extremity swelling.  Advise ambulation.  Recently was on Keflex but stopped on admission due to no evidence of infection. - Elevate legs  HTN -Losartan 100 mg daily  Chronic pain -Continue home regimen of Klonopin, Flexeril, oxycodone, Dilaudid and Requip use as needed.  Bowel regimen.  Hyperlipidemia -Not on any  home medications  Diabetes mellitus type 2 -A1c 6.5.  Holding metformin - Insulin sliding scale and Accu-Chek.  Obesity -Body mass index is 60.91 kg/m..  -Weight loss should be encouraged -Mobility for her is key or she will lose her ability to mobilize soon -Outpatient PCP/bariatric medicine f/u encouraged  DVT prophylaxis: lovenox Code Status: full  Family Communication: none at bedside Disposition:   Status is: Inpatient  Remains inpatient appropriate because:Inpatient level of care appropriate due to severity of illness   Dispo: The patient is from: Home              Anticipated d/c is to: Home              Patient currently is not medically stable to d/c.   Difficult to place patient No       Consultants:   cardiology  Procedures:  LE [place] Summary:  RIGHT:  - There is no evidence of deep vein thrombosis in the lower extremity.  However, portions of this examination were limited- see technologist  comments above.    - No cystic structure found in the popliteal fossa.    LEFT:  - There is no evidence of deep vein thrombosis in the lower extremity.  However, portions of this examination were limited- see technologist  comments above.    - No cystic structure found in the popliteal fossa.   Antimicrobials:  Anti-infectives (From admission, onward)   None         Subjective: Feeling better, anxious about how to improve  Objective: Vitals:   04/09/21 0007 04/09/21 0410 04/09/21 1138 04/09/21  1647  BP:  (!) 124/53 (!) 118/55 (!) 138/56  Pulse:  66 64 67  Resp:  20 20 20   Temp:  98.5 F (36.9 C) 98.4 F (36.9 C) 98 F (36.7 C)  TempSrc:   Oral Oral  SpO2:  96% 93% 100%  Weight: (!) 163.5 kg     Height:        Intake/Output Summary (Last 24 hours) at 04/09/2021 1739 Last data filed at 04/09/2021 1551 Gross per 24 hour  Intake 963 ml  Output 4400 ml  Net -3437 ml   Filed Weights   04/05/21 2007 04/08/21 0029 04/09/21 0007  Weight:  (!) 166 kg (!) 162.9 kg (!) 163.5 kg    Examination:  General exam: Appears calm and comfortable - obese Respiratory system: Clear to auscultation. Respiratory effort normal. Cardiovascular system: S1 & S2 heard, RRR.  Gastrointestinal system: Abdomen is nondistended, soft and nontender.  Central nervous system: Alert and oriented. No focal neurological deficits. Extremities: bilateral LE edema, L>R Skin: No rashes, lesions or ulcers Psychiatry: Judgement and insight appear normal. Mood & affect appropriate.     Data Reviewed: I have personally reviewed following labs and imaging studies  CBC: Recent Labs  Lab 04/05/21 2013 04/07/21 0319  WBC 9.7 7.3  HGB 11.9* 11.7*  HCT 35.9* 34.0*  MCV 89.8 89.5  PLT 262 992    Basic Metabolic Panel: Recent Labs  Lab 04/05/21 2013 04/06/21 0731 04/07/21 0319 04/07/21 1816 04/08/21 0342 04/09/21 0320  NA 129*  --  134* 134* 136 139  K 2.8*  --  2.5* 4.4 2.8* 3.6  CL 81*  --  88* 90* 92* 97*  CO2 34*  --  36* 30 37* 34*  GLUCOSE 132*  --  113* 135* 123* 101*  BUN 24*  --  17 23 23 21   CREATININE 1.41*  --  1.01* 1.30* 1.14* 1.16*  CALCIUM 8.3*  --  8.7* 8.6* 8.4* 8.4*  MG  --  1.7 2.1  --  1.9 1.8  PHOS  --  3.2  --   --   --   --     GFR: Estimated Creatinine Clearance: 78.1 mL/min (Marchella Hibbard) (by C-G formula based on SCr of 1.16 mg/dL (H)).  Liver Function Tests: Recent Labs  Lab 04/08/21 0342 04/09/21 0320  AST 20 27  ALT 19 26  ALKPHOS 57 51  BILITOT 1.0 0.6  PROT 5.9* 6.3*  ALBUMIN 3.3* 3.5    CBG: Recent Labs  Lab 04/08/21 1614 04/08/21 2227 04/09/21 0630 04/09/21 1107 04/09/21 1622  GLUCAP 95 110* 78 117* 129*     Recent Results (from the past 240 hour(s))  SARS CORONAVIRUS 2 (TAT 6-24 HRS) Nasopharyngeal Nasopharyngeal Swab     Status: None   Collection Time: 04/06/21  6:56 PM   Specimen: Nasopharyngeal Swab  Result Value Ref Range Status   SARS Coronavirus 2 NEGATIVE NEGATIVE Final    Comment:  (NOTE) SARS-CoV-2 target nucleic acids are NOT DETECTED.  The SARS-CoV-2 RNA is generally detectable in upper and lower respiratory specimens during the acute phase of infection. Negative results do not preclude SARS-CoV-2 infection, do not rule out co-infections with other pathogens, and should not be used as the sole basis for treatment or other patient management decisions. Negative results must be combined with clinical observations, patient history, and epidemiological information. The expected result is Negative.  Fact Sheet for Patients: SugarRoll.be  Fact Sheet for Healthcare Providers: https://www.woods-mathews.com/  This test is not  yet approved or cleared by the [place] and  has been authorized for detection and/or diagnosis of SARS-CoV-2 by FDA under an Emergency Use Authorization (EUA). This EUA will remain  in effect (meaning this test can be used) for the duration of the COVID-19 declaration under Se ction 564(b)(1) of the Act, 21 U.S.C. section 360bbb-3(b)(1), unless the authorization is terminated or revoked sooner.  Performed at [facility], [place] [address], [place], [place] [address]          Radiology Studies: VAS [place] LOWER EXTREMITY VENOUS (DVT)  Result Date: 04/09/2021  Lower Venous DVT Study Indications: Swelling.  Limitations: Body habitus and poor ultrasound/tissue interface. Comparison Study: No prior studies. Performing Technologist: Darlin Coco RDMS,RVT  Examination Guidelines: Edwar Coe complete evaluation includes B-mode imaging, spectral Doppler, color Doppler, and power Doppler as needed of all accessible portions of each vessel. Bilateral testing is considered an integral part of Caedence Snowden complete examination. Limited examinations for reoccurring indications may be performed as noted. The reflux portion of the exam is performed with the patient in reverse Trendelenburg.   +---------+---------------+---------+-----------+----------+--------------+ RIGHT    CompressibilityPhasicitySpontaneityPropertiesThrombus Aging +---------+---------------+---------+-----------+----------+--------------+ CFV      Full           Yes      Yes                                 +---------+---------------+---------+-----------+----------+--------------+ SFJ      Full                                                        +---------+---------------+---------+-----------+----------+--------------+ FV Prox  Full                                                        +---------+---------------+---------+-----------+----------+--------------+ FV Mid   Full                                                        +---------+---------------+---------+-----------+----------+--------------+ FV DistalFull                                                        +---------+---------------+---------+-----------+----------+--------------+ PFV      Full                                                        +---------+---------------+---------+-----------+----------+--------------+ POP      Full           Yes      Yes                                 +---------+---------------+---------+-----------+----------+--------------+  PTV      Full                                                        +---------+---------------+---------+-----------+----------+--------------+ PERO                    Yes      Yes                                 +---------+---------------+---------+-----------+----------+--------------+   +---------+---------------+---------+-----------+----------+--------------+ LEFT     CompressibilityPhasicitySpontaneityPropertiesThrombus Aging +---------+---------------+---------+-----------+----------+--------------+ CFV      Full           Yes      Yes                                  +---------+---------------+---------+-----------+----------+--------------+ SFJ      Full                                                        +---------+---------------+---------+-----------+----------+--------------+ FV Prox  Full                                                        +---------+---------------+---------+-----------+----------+--------------+ FV Mid   Full                                                        +---------+---------------+---------+-----------+----------+--------------+ FV DistalFull                                                        +---------+---------------+---------+-----------+----------+--------------+ PFV      Full                                                        +---------+---------------+---------+-----------+----------+--------------+ POP      Full           Yes      Yes                                 +---------+---------------+---------+-----------+----------+--------------+ PTV      Full                                                        +---------+---------------+---------+-----------+----------+--------------+  PERO                    Yes      Yes                                 +---------+---------------+---------+-----------+----------+--------------+     Summary: RIGHT: - There is no evidence of deep vein thrombosis in the lower extremity. However, portions of this examination were limited- see technologist comments above.  - No cystic structure found in the popliteal fossa.  LEFT: - There is no evidence of deep vein thrombosis in the lower extremity. However, portions of this examination were limited- see technologist comments above.  - No cystic structure found in the popliteal fossa.  *See table(s) above for measurements and observations. Electronically signed by Jamelle Haring on 04/09/2021 at 4:29:37 PM.    Final         Scheduled Meds: . (feeding supplement) PROSource Plus  30 mL  Oral BID BM  . docusate sodium  100 mg Oral BID  . enoxaparin (LOVENOX) injection  80 mg Subcutaneous Q24H  . insulin aspart  0-15 Units Subcutaneous TID WC  . insulin aspart  0-5 Units Subcutaneous QHS  . losartan  100 mg Oral Daily  . magnesium oxide  400 mg Oral Daily  . metoprolol tartrate  25 mg Oral BID  .  morphine injection  4 mg Intravenous Once  . multivitamin with minerals  1 tablet Oral Daily  . pantoprazole  40 mg Oral Daily  . polyethylene glycol  17 g Oral BID  . potassium chloride  20 mEq Oral BID  . sodium chloride flush  3 mL Intravenous Q12H  . torsemide  40 mg Oral BID  . triamcinolone 0.1 % cream : eucerin   Topical BID   Continuous Infusions:   LOS: 2 days    Time spent: over 30 min    Fayrene Helper, MD [facility]   To contact the attending provider between 7A-7P or the covering provider during after hours 7P-7A, please log into the web site [email or web address] and access using universal [place] password for that web site. If you do not have the password, please call the hospital operator.  04/09/2021, 5:39 PM

## 2021-04-10 ENCOUNTER — Inpatient Hospital Stay (HOSPITAL_COMMUNITY): Payer: Medicare Other

## 2021-04-10 DIAGNOSIS — I5032 Chronic diastolic (congestive) heart failure: Secondary | ICD-10-CM | POA: Diagnosis not present

## 2021-04-10 DIAGNOSIS — N179 Acute kidney failure, unspecified: Secondary | ICD-10-CM | POA: Diagnosis not present

## 2021-04-10 LAB — COMPREHENSIVE METABOLIC PANEL
ALT: 37 U/L (ref 0–44)
AST: 34 U/L (ref 15–41)
Albumin: 3.5 g/dL (ref 3.5–5.0)
Alkaline Phosphatase: 60 U/L (ref 38–126)
Anion gap: 9 (ref 5–15)
BUN: 29 mg/dL — ABNORMAL HIGH (ref 8–23)
CO2: 33 mmol/L — ABNORMAL HIGH (ref 22–32)
Calcium: 8.4 mg/dL — ABNORMAL LOW (ref 8.9–10.3)
Chloride: 99 mmol/L (ref 98–111)
Creatinine, Ser: 1.39 mg/dL — ABNORMAL HIGH (ref 0.44–1.00)
GFR, Estimated: 43 mL/min — ABNORMAL LOW (ref >60)
Glucose, Bld: 97 mg/dL (ref 70–99)
Potassium: 3.7 mmol/L (ref 3.5–5.1)
Sodium: 141 mmol/L (ref 135–145)
Total Bilirubin: 0.7 mg/dL (ref 0.3–1.2)
Total Protein: 6.5 g/dL (ref 6.5–8.1)

## 2021-04-10 LAB — GLUCOSE, CAPILLARY
Glucose-Capillary: 86 mg/dL (ref 70–99)
Glucose-Capillary: 112 mg/dL — ABNORMAL HIGH (ref 70–99)
Glucose-Capillary: 116 mg/dL — ABNORMAL HIGH (ref 70–99)
Glucose-Capillary: 125 mg/dL — ABNORMAL HIGH (ref 70–99)

## 2021-04-10 LAB — MAGNESIUM: Magnesium: 2.1 mg/dL (ref 1.7–2.4)

## 2021-04-10 MED ORDER — BISACODYL 10 MG RE SUPP
10.0000 mg | Freq: Every day | RECTAL | Status: DC | PRN
  Administered 2021-04-10: 10 mg via RECTAL
  Filled 2021-04-10: qty 1

## 2021-04-10 MED ORDER — POLYETHYLENE GLYCOL 3350 17 G PO PACK
17.0000 g | PACK | Freq: Three times a day (TID) | ORAL | Status: DC
  Administered 2021-04-10 – 2021-04-11 (×2): 17 g via ORAL
  Filled 2021-04-10 (×2): qty 1

## 2021-04-10 MED ORDER — SORBITOL 70 % SOLN
960.0000 mL | TOPICAL_OIL | Freq: Once | ORAL | Status: DC
  Filled 2021-04-10: qty 473

## 2021-04-10 MED ORDER — SENNOSIDES-DOCUSATE SODIUM 8.6-50 MG PO TABS
1.0000 | ORAL_TABLET | Freq: Every day | ORAL | Status: DC
  Administered 2021-04-10: 1 via ORAL
  Filled 2021-04-10: qty 1

## 2021-04-10 NOTE — Progress Notes (Signed)
PROGRESS NOTE    Maria [name]  VJK:[id number] DOB: [birth year] DOA: 04/05/2021 PCP: Biagio Borg, MD   Chief Complaint  Patient presents with  . Shortness of Breath  . Leg Swelling   Brief Narrative: 65 year old with history of chronic low back pain, HTN, DM2, HLD, PVD, diastolic CHF admitted for shortness of breath and weight gain discharged on Lasix now presenting again with shortness of breath, lower extremity swelling and laboratory abnormality.  Assessment & Plan:   Principal Problem:   AKI (acute kidney injury) ([place]) Active Problems:   Benign hypertension   Chronic low back pain   Morbid obesity (HCC)   Venous stasis dermatitis of both lower extremities   HLD (hyperlipidemia)   Diastolic congestive heart failure (HCC)   Edema  Abdominal Discomfort  Constipation Aggressive bowel regimen Enema KUB with mildly prominent stool in proximal 2/3 Edm[place]   Severe hypokalemia, 2.8 - Recurrent hypokalemia secondary to diuretic use --improving  -- needs supplementation with diuresis (per cardiology, adding kcl 20 meq BID)  Acute kidney injury, improved -Baseline creatinine 1.0.  Admission creatinine 1.4.   - creatinine trending up again with diuresis, follow - cards planning to reassess torsemide dose based on renal function tomorrow  Chronic diastolic CHF -Echo 04/3793-[id number] 60 to 65%, grade 1 DD -Resume Lasix 40 mg twice daily -> transitioned to torsemide 40 mg twice daily - lostartan, metoprolol -We will consult cardiology for diuretic management and transition to outpatient follow-up.  Appreciate assistance.  May need right heart cath in the future.    Chest Pressure - per cardiology, outpatient follow up - negative troponin - denies any CP to me today  Stasis dermatitis Bilateral lower extremity swelling -Secondary to chronic lower extremity swelling.  Advise ambulation.  Recently was on Keflex but stopped on admission due to no evidence of infection. - Elevate  legs  HTN -Losartan 100 mg daily  Chronic pain -Continue home regimen of Klonopin, Flexeril, oxycodone, Dilaudid and Requip use as needed.  Bowel regimen.  Hyperlipidemia -Not on any home medications  Diabetes mellitus type 2 -A1c 6.5.  Holding metformin - Insulin sliding scale and Accu-Chek.  Obesity -Body mass index is 60.91 kg/m..  -Weight loss should be encouraged -Mobility for her is key or she will lose her ability to mobilize soon -Outpatient PCP/bariatric medicine f/u encouraged  DVT prophylaxis: lovenox Code Status: full  Family Communication: none at bedside Disposition:   Status is: Inpatient  Remains inpatient appropriate because:Inpatient level of care appropriate due to severity of illness   Dispo: The patient is from: Home              Anticipated d/c is to: Home              Patient currently is not medically stable to d/c.   Difficult to place patient No       Consultants:   cardiology  Procedures:  LE [place] Summary:  RIGHT:  - There is no evidence of deep vein thrombosis in the lower extremity.  However, portions of this examination were limited- see technologist  comments above.    - No cystic structure found in the popliteal fossa.    LEFT:  - There is no evidence of deep vein thrombosis in the lower extremity.  However, portions of this examination were limited- see technologist  comments above.    - No cystic structure found in the popliteal fossa.   Antimicrobials:  Anti-infectives (From admission, onward)   None  Subjective: C/o abdominal discomfort, constipation   Objective: Vitals:   04/09/21 1923 04/10/21 0238 04/10/21 0534 04/10/21 1058  BP: 122/61  (!) 114/52 (!) 102/56  Pulse: 65  62 62  Resp: 18  16 18   Temp: 98.3 F (36.8 C)  (!) 97.5 F (36.4 C) 98.4 F (36.9 C)  TempSrc: Oral  Oral Oral  SpO2: 98%  100% 91%  Weight:  (!) 163.2 kg    Height:        Intake/Output Summary (Last 24  hours) at 04/10/2021 1934 Last data filed at 04/10/2021 1827 Gross per 24 hour  Intake 600 ml  Output 1800 ml  Net -1200 ml   Filed Weights   04/08/21 0029 04/09/21 0007 04/10/21 0238  Weight: (!) 162.9 kg (!) 163.5 kg (!) 163.2 kg    Examination:  General: No acute distress. Cardiovascular: Heart sounds show Kelyn Koskela regular rate, and rhythm.  Lungs: Clear to auscultation bilaterally  Abdomen: Soft, nontender, nondistended  Neurological: Alert and oriented 3. Moves all extremities 4 . Cranial nerves II through XII grossly intact. Skin: Warm and dry. No rashes or lesions. Extremities: No clubbing or cyanosis. No edema.      Data Reviewed: I have personally reviewed following labs and imaging studies  CBC: Recent Labs  Lab 04/05/21 2013 04/07/21 0319  WBC 9.7 7.3  HGB 11.9* 11.7*  HCT 35.9* 34.0*  MCV 89.8 89.5  PLT 262 546    Basic Metabolic Panel: Recent Labs  Lab 04/06/21 0731 04/07/21 0319 04/07/21 1816 04/08/21 0342 04/09/21 0320 04/10/21 0410  NA  --  134* 134* 136 139 141  K  --  2.5* 4.4 2.8* 3.6 3.7  CL  --  88* 90* 92* 97* 99  CO2  --  36* 30 37* 34* 33*  GLUCOSE  --  113* 135* 123* 101* 97  BUN  --  17 23 23 21  29*  CREATININE  --  1.01* 1.30* 1.14* 1.16* 1.39*  CALCIUM  --  8.7* 8.6* 8.4* 8.4* 8.4*  MG 1.7 2.1  --  1.9 1.8 2.1  PHOS 3.2  --   --   --   --   --     GFR: Estimated Creatinine Clearance: 65.1 mL/min (Jenisha Faison) (by C-G formula based on SCr of 1.39 mg/dL (H)).  Liver Function Tests: Recent Labs  Lab 04/08/21 0342 04/09/21 0320 04/10/21 0410  AST 20 27 34  ALT 19 26 37  ALKPHOS 57 51 60  BILITOT 1.0 0.6 0.7  PROT 5.9* 6.3* 6.5  ALBUMIN 3.3* 3.5 3.5    CBG: Recent Labs  Lab 04/09/21 1622 04/09/21 2152 04/10/21 0557 04/10/21 1059 04/10/21 1650  GLUCAP 129* 120* 86 112* 116*     Recent Results (from the past 240 hour(s))  SARS CORONAVIRUS 2 (TAT 6-24 HRS) Nasopharyngeal Nasopharyngeal Swab     Status: None   Collection  Time: 04/06/21  6:56 PM   Specimen: Nasopharyngeal Swab  Result Value Ref Range Status   SARS Coronavirus 2 NEGATIVE NEGATIVE Final    Comment: (NOTE) SARS-CoV-2 target nucleic acids are NOT DETECTED.  The SARS-CoV-2 RNA is generally detectable in upper and lower respiratory specimens during the acute phase of infection. Negative results do not preclude SARS-CoV-2 infection, do not rule out co-infections with other pathogens, and should not be used as the sole basis for treatment or other patient management decisions. Negative results must be combined with clinical observations, patient history, and epidemiological information. The expected result is Negative.  Fact Sheet for Patients: SugarRoll.be  Fact Sheet for Healthcare Providers: https://www.woods-mathews.com/  This test is not yet approved or cleared by the [place] FDA and  has been authorized for detection and/or diagnosis of SARS-CoV-2 by FDA under an Emergency Use Authorization (EUA). This EUA will remain  in effect (meaning this test can be used) for the duration of the COVID-19 declaration under Se ction 564(b)(1) of the Act, 21 U.S.C. section 360bbb-3(b)(1), unless the authorization is terminated or revoked sooner.  Performed at [facility], [place] [address] [place], [place] [address]          Radiology Studies: DG Abd 1 View  Result Date: 04/10/2021 CLINICAL DATA:  Constipation, no bowel movement for 6 days, abdominal pain EXAM: ABDOMEN - 1 VIEW COMPARISON:  None FINDINGS: Mildly prominent stool in ascending, proximal transverse, and descending [place]. Nonobstructive bowel gas pattern. No bowel dilatation or bowel wall thickening. Osseous structures unremarkable. Atherosclerotic calcifications aorta. No urinary tract calcification. IMPRESSION: Mildly prominent stool in proximal 2/3 Beshears. Electronically Signed   By: Lavonia Dana M.D.   On: 04/10/2021 15:40    VAS [place] LOWER EXTREMITY VENOUS (DVT)  Result Date: 04/09/2021  Lower Venous DVT Study Indications: Swelling.  Limitations: Body habitus and poor ultrasound/tissue interface. Comparison Study: No prior studies. Performing Technologist: Darlin Coco RDMS,RVT  Examination Guidelines: Loomis Anacker complete evaluation includes B-mode imaging, spectral Doppler, color Doppler, and power Doppler as needed of all accessible portions of each vessel. Bilateral testing is considered an integral part of Chelsey Kimberley complete examination. Limited examinations for reoccurring indications may be performed as noted. The reflux portion of the exam is performed with the patient in reverse Trendelenburg.  +---------+---------------+---------+-----------+----------+--------------+ RIGHT    CompressibilityPhasicitySpontaneityPropertiesThrombus Aging +---------+---------------+---------+-----------+----------+--------------+ CFV      Full           Yes      Yes                                 +---------+---------------+---------+-----------+----------+--------------+ SFJ      Full                                                        +---------+---------------+---------+-----------+----------+--------------+ FV Prox  Full                                                        +---------+---------------+---------+-----------+----------+--------------+ FV Mid   Full                                                        +---------+---------------+---------+-----------+----------+--------------+ FV DistalFull                                                        +---------+---------------+---------+-----------+----------+--------------+ PFV  Full                                                        +---------+---------------+---------+-----------+----------+--------------+ POP      Full           Yes      Yes                                  +---------+---------------+---------+-----------+----------+--------------+ PTV      Full                                                        +---------+---------------+---------+-----------+----------+--------------+ PERO                    Yes      Yes                                 +---------+---------------+---------+-----------+----------+--------------+   +---------+---------------+---------+-----------+----------+--------------+ LEFT     CompressibilityPhasicitySpontaneityPropertiesThrombus Aging +---------+---------------+---------+-----------+----------+--------------+ CFV      Full           Yes      Yes                                 +---------+---------------+---------+-----------+----------+--------------+ SFJ      Full                                                        +---------+---------------+---------+-----------+----------+--------------+ FV Prox  Full                                                        +---------+---------------+---------+-----------+----------+--------------+ FV Mid   Full                                                        +---------+---------------+---------+-----------+----------+--------------+ FV DistalFull                                                        +---------+---------------+---------+-----------+----------+--------------+ PFV      Full                                                        +---------+---------------+---------+-----------+----------+--------------+  POP      Full           Yes      Yes                                 +---------+---------------+---------+-----------+----------+--------------+ PTV      Full                                                        +---------+---------------+---------+-----------+----------+--------------+ PERO                    Yes      Yes                                  +---------+---------------+---------+-----------+----------+--------------+     Summary: RIGHT: - There is no evidence of deep vein thrombosis in the lower extremity. However, portions of this examination were limited- see technologist comments above.  - No cystic structure found in the popliteal fossa.  LEFT: - There is no evidence of deep vein thrombosis in the lower extremity. However, portions of this examination were limited- see technologist comments above.  - No cystic structure found in the popliteal fossa.  *See table(s) above for measurements and observations. Electronically signed by Jamelle Haring on 04/09/2021 at 4:29:37 PM.    Final         Scheduled Meds: . (feeding supplement) PROSource Plus  30 mL Oral BID BM  . docusate sodium  100 mg Oral BID  . enoxaparin (LOVENOX) injection  80 mg Subcutaneous Q24H  . insulin aspart  0-15 Units Subcutaneous TID WC  . insulin aspart  0-5 Units Subcutaneous QHS  . losartan  100 mg Oral Daily  . magnesium oxide  400 mg Oral Daily  . metoprolol tartrate  25 mg Oral BID  .  morphine injection  4 mg Intravenous Once  . multivitamin with minerals  1 tablet Oral Daily  . pantoprazole  40 mg Oral Daily  . polyethylene glycol  17 g Oral BID  . potassium chloride  20 mEq Oral BID  . sodium chloride flush  3 mL Intravenous Q12H  . sorbitol, milk of mag, mineral oil, glycerin (SMOG) enema  960 mL Rectal Once  . torsemide  40 mg Oral BID  . triamcinolone 0.1 % cream : eucerin   Topical BID   Continuous Infusions:   LOS: 3 days    Time spent: over 30 min    Fayrene Helper, MD [facility]   To contact the attending provider between 7A-7P or the covering provider during after hours 7P-7A, please log into the web site [email or web address] and access using universal [place] password for that web site. If you do not have the password, please call the hospital operator.  04/10/2021, 7:34 PM

## 2021-04-10 NOTE — Progress Notes (Addendum)
Progress Note  Patient Name: Maria [name] - FYI: Last name pronounced [place], not the organ. Date of Encounter: 04/10/2021  Primary Cardiologist: Seen by Dr. Percival Spanish this admission as a new consult, but pending referral to Dr. Gwenlyn Found (will clarify with MD plan for primary cardiologist)  Subjective   Breathing remains improved and no significant return of edema. No CP. Appears comfortable. Main complaint is trying to have BM  Inpatient Medications    Scheduled Meds: . (feeding supplement) PROSource Plus  30 mL Oral BID BM  . docusate sodium  100 mg Oral BID  . enoxaparin (LOVENOX) injection  80 mg Subcutaneous Q24H  . insulin aspart  0-15 Units Subcutaneous TID WC  . insulin aspart  0-5 Units Subcutaneous QHS  . losartan  100 mg Oral Daily  . magnesium oxide  400 mg Oral Daily  . metoprolol tartrate  25 mg Oral BID  .  morphine injection  4 mg Intravenous Once  . multivitamin with minerals  1 tablet Oral Daily  . pantoprazole  40 mg Oral Daily  . polyethylene glycol  17 g Oral BID  . potassium chloride  20 mEq Oral BID  . sodium chloride flush  3 mL Intravenous Q12H  . torsemide  40 mg Oral BID  . triamcinolone 0.1 % cream : eucerin   Topical BID   Continuous Infusions:  PRN Meds: acetaminophen **OR** acetaminophen, albuterol, bisacodyl, clonazePAM, cyclobenzaprine, dextromethorphan-guaiFENesin, HYDROmorphone (DILAUDID) injection, ondansetron **OR** ondansetron (ZOFRAN) IV, oxyCODONE, polyethylene glycol, rOPINIRole   Vital Signs    Vitals:   04/09/21 1647 04/09/21 1923 04/10/21 0238 04/10/21 0534  BP: (!) 138/56 122/61  (!) 114/52  Pulse: 67 65  62  Resp: 20 18  16   Temp: 98 F (36.7 C) 98.3 F (36.8 C)  (!) 97.5 F (36.4 C)  TempSrc: Oral Oral  Oral  SpO2: 100% 98%  100%  Weight:   (!) 163.2 kg   Height:        Intake/Output Summary (Last 24 hours) at 04/10/2021 0900 Last data filed at 04/10/2021 0748 Gross per 24 hour  Intake 1083 ml  Output 3600 ml   Net -2517 ml   Last 3 Weights 04/10/2021 04/09/2021 04/08/2021  Weight (lbs) 359 lb 12.7 oz 360 lb 6.4 oz 359 lb 2.1 oz  Weight (kg) 163.2 kg 163.476 kg 162.9 kg     Telemetry    N/A, not on telemetry - Personally Reviewed  Physical Exam   GEN: No acute distress. Body habitus c/w morbid obesity HEENT: Normocephalic, atraumatic, sclera non-icteric. Neck: No JVD or bruits. Cardiac: RRR no murmurs, rubs, or gallops.  Respiratory: Clear to auscultation bilaterally. Breathing is unlabored. GI: Soft, nontender, non-distended, BS +x 4. MS: no deformity. Extremities: No clubbing or cyanosis. No significant edema - large baseline leg habitus noted proportionate to degree of adiposity. Distal pedal pulses are 2+ and equal bilaterally. Neuro:  AAOx3. Follows commands. Psych:  Responds to questions appropriately with a normal affect.  Labs    High Sensitivity Troponin:   Recent Labs  Lab 04/01/21 1410 04/01/21 1515 04/05/21 2013  TROPONINIHS 3 <2 6      Cardiac EnzymesNo results for input(s): TROPONINI in the last 168 hours. No results for input(s): TROPIPOC in the last 168 hours.   Chemistry Recent Labs  Lab 04/08/21 0342 04/09/21 0320 04/10/21 0410  NA 136 139 141  K 2.8* 3.6 3.7  CL 92* 97* 99  CO2 37* 34* 33*  GLUCOSE 123* 101*  97  BUN 23 21 29*  CREATININE 1.14* 1.16* 1.39*  CALCIUM 8.4* 8.4* 8.4*  PROT 5.9* 6.3* 6.5  ALBUMIN 3.3* 3.5 3.5  AST 20 27 34  ALT 19 26 37  ALKPHOS 57 51 60  BILITOT 1.0 0.6 0.7  GFRNONAA 54* 53* 43*  ANIONGAP 7 8 9      Hematology Recent Labs  Lab 04/05/21 2013 04/07/21 0319  WBC 9.7 7.3  RBC 4.00 3.80*  HGB 11.9* 11.7*  HCT 35.9* 34.0*  MCV 89.8 89.5  MCH 29.8 30.8  MCHC 33.1 34.4  RDW 12.5 12.5  PLT 262 244    BNP Recent Labs  Lab 04/05/21 2019  BNP 13.2     DDimer No results for input(s): DDIMER in the last 168 hours.   Radiology    VAS [place] LOWER EXTREMITY VENOUS (DVT)  Result Date: 04/09/2021  Lower Venous  DVT Study Indications: Swelling.  Limitations: Body habitus and poor ultrasound/tissue interface. Comparison Study: No prior studies. Performing Technologist: Darlin Coco RDMS,RVT  Examination Guidelines: A complete evaluation includes B-mode imaging, spectral Doppler, color Doppler, and power Doppler as needed of all accessible portions of each vessel. Bilateral testing is considered an integral part of a complete examination. Limited examinations for reoccurring indications may be performed as noted. The reflux portion of the exam is performed with the patient in reverse Trendelenburg.  +---------+---------------+---------+-----------+----------+--------------+ RIGHT    CompressibilityPhasicitySpontaneityPropertiesThrombus Aging +---------+---------------+---------+-----------+----------+--------------+ CFV      Full           Yes      Yes                                 +---------+---------------+---------+-----------+----------+--------------+ SFJ      Full                                                        +---------+---------------+---------+-----------+----------+--------------+ FV Prox  Full                                                        +---------+---------------+---------+-----------+----------+--------------+ FV Mid   Full                                                        +---------+---------------+---------+-----------+----------+--------------+ FV DistalFull                                                        +---------+---------------+---------+-----------+----------+--------------+ PFV      Full                                                        +---------+---------------+---------+-----------+----------+--------------+  POP      Full           Yes      Yes                                 +---------+---------------+---------+-----------+----------+--------------+ PTV      Full                                                         +---------+---------------+---------+-----------+----------+--------------+ PERO                    Yes      Yes                                 +---------+---------------+---------+-----------+----------+--------------+   +---------+---------------+---------+-----------+----------+--------------+ LEFT     CompressibilityPhasicitySpontaneityPropertiesThrombus Aging +---------+---------------+---------+-----------+----------+--------------+ CFV      Full           Yes      Yes                                 +---------+---------------+---------+-----------+----------+--------------+ SFJ      Full                                                        +---------+---------------+---------+-----------+----------+--------------+ FV Prox  Full                                                        +---------+---------------+---------+-----------+----------+--------------+ FV Mid   Full                                                        +---------+---------------+---------+-----------+----------+--------------+ FV DistalFull                                                        +---------+---------------+---------+-----------+----------+--------------+ PFV      Full                                                        +---------+---------------+---------+-----------+----------+--------------+ POP      Full           Yes      Yes                                 +---------+---------------+---------+-----------+----------+--------------+  PTV      Full                                                        +---------+---------------+---------+-----------+----------+--------------+ PERO                    Yes      Yes                                 +---------+---------------+---------+-----------+----------+--------------+     Summary: RIGHT: - There is no evidence of deep vein thrombosis in the lower extremity. However, portions  of this examination were limited- see technologist comments above.  - No cystic structure found in the popliteal fossa.  LEFT: - There is no evidence of deep vein thrombosis in the lower extremity. However, portions of this examination were limited- see technologist comments above.  - No cystic structure found in the popliteal fossa.  *See table(s) above for measurements and observations. Electronically signed by Jamelle Haring on 04/09/2021 at 4:29:37 PM.    Final     Cardiac Studies   2D echo 03/08/21 1. Left ventricular ejection fraction, by estimation, is 60 to 65%. The left ventricle has normal function. The left ventricle has no regional wall motion abnormalities. Left ventricular diastolic parameters are consistent with Grade I diastolic dysfunction (impaired relaxation). Elevated left ventricular end-diastolic  pressure.  2. Right ventricular systolic function is normal. The right ventricular size is normal. Tricuspid regurgitation signal is inadequate for assessing PA pressure.  3. The mitral valve is normal in structure. No evidence of mitral valve regurgitation. No evidence of mitral stenosis.  4. The aortic valve is normal in structure. Aortic valve regurgitation is not visualized. No aortic stenosis is present.  5. The inferior vena cava is normal in size with greater than 50% respiratory variability, suggesting right atrial pressure of 3 mmHg.   Patient Profile     65 y.o. female with chronic diastolic CHF, HTN, HLD, DM type 2, chronic pain, reported PVD, super morbid obesity, fibromyalgia, restless legs,mild OSA and chronic back pain. Recently admitted 02/2021 for reported acute on chronic diastolic CHF with weight gain/edema. Seen in the ED back for similar symptoms 04/01/21. Returned back tot he ER 04/06/21 with leg cramping, SOB, weight gain, swelling and edema. Labs revealed hyponatremia (Na 129), hypokalemia (2.8), AKI (Cr 1.4 from baseline 1.). She has not followed with cardiology  outpatient, though is scheduled to see Dr. Gwenlyn Found 05/13/21 to establish care. Her last echocardiogram 03/08/21 showed EF 60-65%, G1DD, no RWMA, elevated LVEDP, and no significant valvular abnormalities. Does not appear to have had prior ischemic testing though does have evidence of prior coronary artery calcifications (specifically LAD) noted on prior CTA Chest in 2020.  Assessment & Plan    1. Chronic diastolic CHF - volume status is difficult to gauge given obesity but lungs are clear and she has no significant edema on exam (suspect current size of limbs is proportionate to her level of obesity)  - suspect there has been a component of OSA/OHS playing a role in her DOE and chronic venous stasis issues contributing to her LE edema - she was felt to be overdiuresed prior to admission (weights extremely variable in EMR - most recent  peak 373lb in 02/2021, discharged at 357lb and at 359.8lb today - Lasix changed to torsemide 40mg  BID last night - she feels good on this regimen so will continue - her creatinine bumped to 1.39 today but clearance is still well preserved at 134ml/min so will continue for now and review with MD - continue KCL 35meq BID and MagOx 400mg  daily - in the future if she has continued issues with DOE/fluid retention would benefit from a more definitive right heart cath    2. Chest pressure in patient with coronary artery calcifications on CT scan - noted rare episodes of chest pressure with prolonged exertion. Could be due to demand processes.  - HsTroponin negative this admission and EKG is non-ischemic - nuclear stress testing and coronary CTA would be challenging given body habitus - follow symptoms as outpatient  3. Essential HTN - BP controlled  4. Hyperlipidemia - LDL 83 12/2020; goal <70. She has made significant dietary changes since that time.  - continue to encourage dietary/lifestyle modifications to promote weight loss  5. Severe morbid obesity  - her medical  problems and dyspnea will likely continue to be an issue if this is not addressed - consider referral to medical weight management as OP - consider Ozempic given obesity and DM  6. Suspected OSA - reports sleep study 1 year ago did not show OSA - do not see record of this, however study in 2019 suggested mild OSA. Possible this has progressed.  - may benefit from a repeat sleep study, can address as OP  7. AKI superimposed on possible baseline CKD stage II -historical Cr is around 0.9 when fluid overloaded with otherwise range from 1-1.44 recently - CrCl is OK at 1106ml/min so may need to run Cr slightly higher to maintain euvolemia - await input from MD  8. Constipation - per IM  TOC clinic f/u made 04/16/21. Pt also had new patient f/u scheduled with Dr. Gwenlyn Found 5/24 to establish care -will await input from Dr. Percival Spanish about follow-up plan since seen by [facility] in house this admission.  For questions or updates, please contact [place] Please consult [email or web address] for contact info under Cardiology/STEMI.  Signed, Charlie Pitter, PA-C 04/10/2021, 9:00 AM     History and all data above reviewed.  Patient examined.  I agree with the findings as above.   She is breathing OK.  Her weight is almost at her previous weight at discharge with 257 being her probable "dry" weight.  She is very uncomfortable with constipation. The patient exam reveals COR:RRR  ,  Lungs: Clear  ,  Abd: Positive bowel sounds, no rebound no guarding, Ext Mild edema  .  All available labs, radiology testing, previous records reviewed. Agree with documented assessment and plan.   Acute on chronic diastolic HF:  I think she is tolerating the PO Demadex.  I would suggest continue this dose today but I will reassess the discharge dose based on her creatinine prior to discharge.   Maria [name]  12:00 PM  04/10/2021

## 2021-04-10 NOTE — Progress Notes (Signed)
Physical Therapy Treatment Patient Details Name: Maria [name] MRN: [id number] DOB: 1956/01/10 Today's Date: 04/10/2021    History of Present Illness 65 y.o. female admitted with CHF exacerbation. PMH: chronic low back pain; HTN; HLD; DM; PVD; and chronic diastolic CHF presenting with SOB.    PT Comments    Pt received in chair, agreeable to therapy session and with good participation and fair tolerance for mobility. Had planned to trial steps with patient but she deferred due to severe abdominal (intestinal related) pain/pressure, RN/MD aware. Instead focused on seated/supine HEP instruction and short gait tasks in room and encouraged frequent gait bouts as tolerated which may help with passage of stool through system. Pt receptive to instruction but internally distracted due to discomfort. Will plan for gait/stair training progression next session as tolerated. Pt continues to benefit from PT services to progress toward functional mobility goals. Continue to recommend HHPT.   Follow Up Recommendations  Home health PT;Supervision - Intermittent     Equipment Recommendations  None recommended by PT    Recommendations for Other Services       Precautions / Restrictions Precautions Precautions: Fall Restrictions Weight Bearing Restrictions: No    Mobility  Bed Mobility               General bed mobility comments: Up in chair upon PTA arrival.    Transfers Overall transfer level: Needs assistance Equipment used: Straight cane Transfers: Sit to/from Stand Sit to Stand: Supervision         General transfer comment: from chair and toilet heights, increased time to perform but no physical assist needed; c/o abominal pain so min cues for technique/safety  Ambulation/Gait Ambulation/Gait assistance: Supervision Gait Distance (Feet): 60 Feet (54ft, to bathroom, then 109ft) Assistive device: Straight cane Gait Pattern/deviations: Step-through pattern;Decreased stride  length;Wide base of support Gait velocity: grossly <0.4 m/s; variable due to short distances in room   General Gait Details: slightly antalgic gait, c/o evolving B knee pain from OA and abdominal discomfort; HR 80's bpm and SpO2 94% on RA   Stairs             Wheelchair Mobility    Modified Rankin (Stroke Patients Only)       Balance Overall balance assessment: Needs assistance Sitting-balance support: Feet supported;No upper extremity supported Sitting balance-Leahy Scale: Good Sitting balance - Comments: increased discomfort sitting upright due to abdominal tightness so mostly relying on backrest support of chair   Standing balance support: During functional activity Standing balance-Leahy Scale: Fair Standing balance comment: no LOB using cane but slightly antalgic due to knee OA                            Cognition Arousal/Alertness: Awake/alert Behavior During Therapy: WFL for tasks assessed/performed;Anxious Overall Cognitive Status: Within Functional Limits for tasks assessed                                 General Comments: anxious re: bowel pain/pressure and perseverating on this; deferred stair/hallway ambulation due to the pain, RN/MD aware      Exercises General Exercises - Lower Extremity Ankle Circles/Pumps: AROM;Both;10 reps;Seated Quad Sets: AROM;Both;5 reps;Other (comment) (reclined) Gluteal Sets: AROM;Both;10 reps;Seated Long Arc Quad: AROM;Both;10 reps;Seated    General Comments General comments (skin integrity, edema, etc.): instruction on activities to perform despite abdominal pressure/pain such as supine HEP and repositioning for  comfort, gentle massage from L lateral side of belly (~belly button height to groin) in downward direction to encourage passage of stool (not to press directly on sore spot on top of abdomen)      Pertinent Vitals/Pain Pain Assessment: 0-10 Pain Score: 8  Pain Location: abdominal  cramping/tighness (hasn't had bowel movement in 6 days); occasional radiating pain in R shoulder with IR Pain Descriptors / Indicators: Discomfort;Stabbing;Sharp;Tightness Pain Intervention(s): Limited activity within patient's tolerance;Monitored during session;Premedicated before session;Repositioned;Other (comment) (RN notified pt requesting coffee and per RN, pt OK to have)    Home Living                      Prior Function            PT Goals (current goals can now be found in the care plan section) Acute Rehab PT Goals Patient Stated Goal: to reduce abdominal pain and get around better PT Goal Formulation: With patient Time For Goal Achievement: 04/21/21 Potential to Achieve Goals: Good Progress towards PT goals: Progressing toward goals    Frequency    Min 3X/week      PT Plan Current plan remains appropriate    Co-evaluation              AM-PAC PT "6 Clicks" Mobility   Outcome Measure  Help needed turning from your back to your side while in a flat bed without using bedrails?: A Little Help needed moving from lying on your back to sitting on the side of a flat bed without using bedrails?: A Little Help needed moving to and from a bed to a chair (including a wheelchair)?: A Little Help needed standing up from a chair using your arms (e.g., wheelchair or bedside chair)?: A Little Help needed to walk in hospital room?: A Little Help needed climbing 3-5 steps with a railing? : A Little 6 Click Score: 18    End of Session   Activity Tolerance: Patient limited by pain Patient left: in chair;with call bell/phone within reach Nurse Communication: Mobility status;Other (comment) (pt c/o continued abdominal pain/pressure hasn't had BM in 6 days) PT Visit Diagnosis: Pain;Difficulty in walking, not elsewhere classified (R26.2) Pain - part of body:  (intestinal related cramping, B knee pain (chronic), R shoulder pain with IR)     Time: 6283-1517 PT Time  Calculation (min) (ACUTE ONLY): 29 min  Charges:  $Gait Training: 8-22 mins $Therapeutic Exercise: 8-22 mins                     Darrion Macaulay P., PTA Acute Rehabilitation Services Pager: [phone] Office: [place] 04/10/2021, 1:01 PM

## 2021-04-10 NOTE — Progress Notes (Signed)
Nutrition Follow-up  DOCUMENTATION CODES:   Morbid obesity  INTERVENTION:   -Continue double protein portions with meals -Continue 30 ml Prosource Plus BID, each supplement provides 100 kcals and 15 grams protein -Continue MVI with minerals daily  NUTRITION DIAGNOSIS:   Increased nutrient needs related to chronic illness (CHF) as evidenced by estimated needs.  Ongoing  GOAL:   Patient will meet greater than or equal to 90% of their needs  Progressing   MONITOR:   PO intake,Supplement acceptance,Labs,Weight trends,Skin,I & O's  REASON FOR ASSESSMENT:   Consult Assessment of nutrition requirement/status  ASSESSMENT:   Maria [name] , a 65 y.o. female  was evaluated in triage.  Pt complains of increased leg swelling, shortness of breath.  Noticed this has worsened in the past week.  Swelling extends to my abdomen.  Has been taking Lasix 80 mg as prescribed without much improvement, does not feel that she is diuresing sufficiently.  Reviewed I/O's: -2.5 L x 24 hours and -9.7 L since admission  UOP: 3.6 L x 24 hours  Spoke with pt, who was sitting in recliner chair at time of visit. She reports feeling "miserable" secondary to constipation and abdominal pain. Per pt, her last BM was on 04/04/21- she has received laxatives and stool softeners, which have not helped her. PTA she has regular daily bowel movements, but used stool softener and prune juice daily. Pt very tearful at time of visit due to this. Case discussed with Dr. Florene Glen via secure chat- pt may discharge home today, but may try enema or suppository today.   Per pt, she had a fair appetite PTA. She consumed 2 meals per day (Breakfast: cereal, fruit, and yogurt; Dinner: meal from [employer]). Pt with fair appetite; noted meal completions 50-100%- she is concerned that her constipation will impact her appetite.   Medications reviewed and include colace, magnesium oxide, miralax, and demadex.  Labs reviewed:  CBGS: 86-129 (inpatient orders for glycemic control are 0-15 units insulin aspart TID with meals and 0-5 units insulin aspart daily at bedtime).   NUTRITION - FOCUSED PHYSICAL EXAM:  Flowsheet Row Most Recent Value  Orbital Region No depletion  Upper Arm Region No depletion  Thoracic and Lumbar Region No depletion  Buccal Region No depletion  Temple Region No depletion  Clavicle Bone Region No depletion  Clavicle and Acromion Bone Region No depletion  Scapular Bone Region No depletion  Dorsal Hand No depletion  Patellar Region No depletion  Anterior Thigh Region No depletion  Posterior Calf Region No depletion  Edema (RD Assessment) Mild  Hair Reviewed  Eyes Reviewed  Mouth Reviewed  Skin Reviewed  Nails Reviewed       Diet Order:   Diet Order            Diet Carb Modified Fluid consistency: Thin; Room service appropriate? Yes; Fluid restriction: 1800 mL Fluid  Diet effective now                 EDUCATION NEEDS:   No education needs have been identified at this time  Skin:  Skin Assessment: Reviewed RN Assessment  Last BM:  04/04/21  Height:   Ht Readings from Last 1 Encounters:  04/05/21 5\' 5"  (1.651 m)    Weight:   Wt Readings from Last 1 Encounters:  04/10/21 (!) 163.2 kg    Ideal Body Weight:  56.8 kg  BMI:  Body mass index is 59.87 kg/m.  Estimated Nutritional Needs:   Kcal:  1800-2000  Protein:  125-140 grams  Fluid:  > 1.8 L    Loistine Chance, RD, LDN, [place] Registered Dietitian II Certified Diabetes Care and Education Specialist Please refer to [facility] for RD and/or RD on-call/weekend/after hours pager

## 2021-04-10 NOTE — Progress Notes (Signed)
Placed care order for nursing to communicate with care management about Dr Hochrein's recommendation to arrange home health so that patient may have home blood draws when needed. In addition to [facility] f/u, also arranged f/u in our office in 1 month.

## 2021-04-11 ENCOUNTER — Other Ambulatory Visit (HOSPITAL_COMMUNITY): Payer: Self-pay

## 2021-04-11 DIAGNOSIS — N179 Acute kidney failure, unspecified: Secondary | ICD-10-CM | POA: Diagnosis not present

## 2021-04-11 DIAGNOSIS — I5032 Chronic diastolic (congestive) heart failure: Secondary | ICD-10-CM | POA: Diagnosis not present

## 2021-04-11 LAB — CBC WITH DIFFERENTIAL/PLATELET
Abs Immature Granulocytes: 0.04 10*3/uL (ref 0.00–0.07)
Basophils Absolute: 0.1 10*3/uL (ref 0.0–0.1)
Basophils Relative: 1 %
Eosinophils Absolute: 0.2 10*3/uL (ref 0.0–0.5)
Eosinophils Relative: 3 %
HCT: 35 % — ABNORMAL LOW (ref 36.0–46.0)
Hemoglobin: 11.4 g/dL — ABNORMAL LOW (ref 12.0–15.0)
Immature Granulocytes: 1 %
Lymphocytes Relative: 39 %
Lymphs Abs: 3 10*3/uL (ref 0.7–4.0)
MCH: 30.4 pg (ref 26.0–34.0)
MCHC: 32.6 g/dL (ref 30.0–36.0)
MCV: 93.3 fL (ref 80.0–100.0)
Monocytes Absolute: 0.6 10*3/uL (ref 0.1–1.0)
Monocytes Relative: 8 %
Neutro Abs: 3.7 10*3/uL (ref 1.7–7.7)
Neutrophils Relative %: 48 %
Platelets: 260 10*3/uL (ref 150–400)
RBC: 3.75 MIL/uL — ABNORMAL LOW (ref 3.87–5.11)
RDW: 12.8 % (ref 11.5–15.5)
WBC: 7.6 10*3/uL (ref 4.0–10.5)
nRBC: 0 % (ref 0.0–0.2)

## 2021-04-11 LAB — BASIC METABOLIC PANEL
Anion gap: 7 (ref 5–15)
BUN: 24 mg/dL — ABNORMAL HIGH (ref 8–23)
CO2: 34 mmol/L — ABNORMAL HIGH (ref 22–32)
Calcium: 8.5 mg/dL — ABNORMAL LOW (ref 8.9–10.3)
Chloride: 100 mmol/L (ref 98–111)
Creatinine, Ser: 1.22 mg/dL — ABNORMAL HIGH (ref 0.44–1.00)
GFR, Estimated: 50 mL/min — ABNORMAL LOW (ref >60)
Glucose, Bld: 109 mg/dL — ABNORMAL HIGH (ref 70–99)
Potassium: 3.7 mmol/L (ref 3.5–5.1)
Sodium: 141 mmol/L (ref 135–145)

## 2021-04-11 LAB — GLUCOSE, CAPILLARY
Glucose-Capillary: 113 mg/dL — ABNORMAL HIGH (ref 70–99)
Glucose-Capillary: 126 mg/dL — ABNORMAL HIGH (ref 70–99)

## 2021-04-11 LAB — MAGNESIUM: Magnesium: 2.1 mg/dL (ref 1.7–2.4)

## 2021-04-11 MED ORDER — ACCU-CHEK GUIDE W/DEVICE KIT
PACK | 0 refills | Status: DC
Start: 2021-04-11 — End: 2023-12-12
  Filled 2021-04-11: qty 1, 1d supply, fill #0

## 2021-04-11 MED ORDER — MAGNESIUM OXIDE 400 MG PO TABS
400.0000 mg | ORAL_TABLET | Freq: Every day | ORAL | 0 refills | Status: AC
  Filled 2021-04-11: qty 30, 30d supply, fill #0

## 2021-04-11 MED ORDER — POTASSIUM CHLORIDE CRYS ER 20 MEQ PO TBCR
20.0000 meq | EXTENDED_RELEASE_TABLET | Freq: Two times a day (BID) | ORAL | 0 refills | Status: DC
  Filled 2021-04-11: qty 60, 30d supply, fill #0

## 2021-04-11 MED ORDER — ACCU-CHEK GUIDE VI STRP
ORAL_STRIP | 12 refills | Status: DC
  Filled 2021-04-11: qty 100, 25d supply, fill #0

## 2021-04-11 MED ORDER — ACCU-CHEK SOFTCLIX LANCETS MISC
5 refills | Status: DC
  Filled 2021-04-11: qty 100, 25d supply, fill #0

## 2021-04-11 MED ORDER — METOPROLOL TARTRATE 25 MG PO TABS
25.0000 mg | ORAL_TABLET | Freq: Two times a day (BID) | ORAL | 0 refills | Status: DC
  Filled 2021-04-11: qty 60, 30d supply, fill #0

## 2021-04-11 MED ORDER — POLYETHYLENE GLYCOL 3350 17 GM/SCOOP PO POWD
17.0000 g | Freq: Every day | ORAL | 0 refills | Status: DC | PRN
  Filled 2021-04-11: qty 510, 30d supply, fill #0

## 2021-04-11 MED ORDER — BLOOD GLUCOSE MONITOR KIT
PACK | 0 refills | Status: DC
  Filled 2021-04-11: qty 1, fill #0

## 2021-04-11 MED ORDER — TORSEMIDE 20 MG PO TABS
40.0000 mg | ORAL_TABLET | Freq: Two times a day (BID) | ORAL | 0 refills | Status: DC
  Filled 2021-04-11: qty 120, 30d supply, fill #0

## 2021-04-11 NOTE — Plan of Care (Signed)
  Problem: Increased Nutrient Needs (NI-5.1) Goal: Food and/or nutrient delivery Description: Individualized approach for food/nutrient provision. Outcome: Adequate for Discharge   Problem: Education: Goal: Knowledge of General Education information will improve Description: Including pain rating scale, medication(s)/side effects and non-pharmacologic comfort measures Outcome: Adequate for Discharge   Problem: Health Behavior/Discharge Planning: Goal: Ability to manage health-related needs will improve Outcome: Adequate for Discharge   Problem: Clinical Measurements: Goal: Ability to maintain clinical measurements within normal limits will improve Outcome: Adequate for Discharge Goal: Will remain free from infection Outcome: Adequate for Discharge Goal: Diagnostic test results will improve Outcome: Adequate for Discharge Goal: Respiratory complications will improve Outcome: Adequate for Discharge Goal: Cardiovascular complication will be avoided Outcome: Adequate for Discharge

## 2021-04-11 NOTE — Care Management Important Message (Signed)
Important Message  Patient Details  Name: Maria [name] MRN: [id number] Date of Birth: Oct 31, 1956   Medicare Important Message Given:  Yes     Shelda Altes 04/11/2021, 10:44 AM

## 2021-04-11 NOTE — TOC Initial Note (Signed)
Transition of Care [facility]) - Initial/Assessment Note    Patient Details  Name: Maria [name] MRN: [id number] Date of Birth: [birth year]  Transition of Care [facility]) CM/SW Contact:    Zenon Mayo, RN Phone Number: 04/11/2021, 11:42 AM  Clinical Narrative:                 Patient is for dc today she has a scale at home but does not have a bp cuff.  She has a cane and a rollator.  She lives in apartment alone.  NCM offered choice she states Alvis Lemmings will be ok with her.  NCM made referral to [facility] with Alvis Lemmings for [facility], [place], HHOT, HHAIDE and Social Work.  Soc will begin on Monday or Tuesday of next week.  She will check with her friend to see if she can transport her home today if not she will let her Nurse know that she needs transport assist.     Expected Discharge Plan: [place] Barriers to Discharge: No Barriers Identified   Patient Goals and CMS Choice Patient states their goals for this hospitalization and ongoing recovery are:: return home CMS Medicare.gov Compare Post Acute Care list provided to:: Patient Choice offered to / list presented to : Patient  Expected Discharge Plan and Services Expected Discharge Plan: [place] In-house Referral: NA Discharge Planning Services: CM Consult Post Acute Care Choice: [place] arrangements for the past 2 months: Apartment Expected Discharge Date: 04/11/21                 DME Agency: NA       HH Arranged: RN,Disease Management,PT,OT,Nurse's Aide,Social Work [employer] Agency: [place] Date [place]: 04/11/21 Time [place]: 44 Representative spoke with at [place]: Tommi Rumps  Prior Living Arrangements/Services Living arrangements for the past 2 months: Apartment Lives with:: Self Patient language and need for interpreter reviewed:: Yes Do you feel safe going back to the place where you live?: Yes      Need for Family Participation in Patient Care: No  (Comment) Care giver support system in place?: No (comment) Current home services: DME (cane, rollator) Criminal Activity/Legal Involvement Pertinent to Current Situation/Hospitalization: No - Comment as needed  Activities of Daily Living      Permission Sought/Granted                  Emotional Assessment Appearance:: Appears stated age Attitude/Demeanor/Rapport: Engaged Affect (typically observed): Appropriate Orientation: : Oriented to Self,Oriented to Place,Oriented to  Time,Oriented to Situation Alcohol / Substance Use: Not Applicable Psych Involvement: No (comment)  Admission diagnosis:  Edema [R60.9] Peripheral edema [R60.9] Patient Active Problem List   Diagnosis Date Noted  . Edema 04/06/2021  . AKI (acute kidney injury) ([place]) 04/06/2021  . Diastolic congestive heart failure ([place]) 03/17/2021  . Diabetes mellitus type 2 in obese ([place]) 03/11/2021  . Acute diastolic CHF (congestive heart failure) ([place]) 03/07/2021  . Acute on chronic diastolic (congestive) heart failure ([place]) 03/06/2021  . Vitamin D deficiency 01/01/2021  . Medial epicondylitis 12/31/2020  . Aortic atherosclerosis ([place]) 09/26/2020  . Umbilical hernia 79/89/2119  . Constipation 08/05/2020  . Arthritis 06/12/2020  . Hoarseness 05/01/2020  . Vitamin B 12 deficiency 01/31/2020  . Rosacea 01/31/2020  . Neck swelling 01/31/2020  . History of colonic polyps   . Benign neoplasm of [place]   . Nausea 08/31/2019  . Throat pain 07/26/2019  . HLD (hyperlipidemia) 05/24/2019  .  Leg cramps 05/24/2019  . Lump in neck 05/24/2019  . Hyperglycemia 01/25/2019  . Left thyroid nodule 01/25/2019  . Jerking movements of extremities 07/20/2018  . Balance disorder 07/20/2018  . Right knee pain 03/21/2018  . OSA (obstructive sleep apnea) 03/21/2018  . Oxygen desaturation 03/21/2018  . Urinary symptom or sign 03/21/2018  . Acquired lymphedema 01/25/2018  . Gait disorder 01/25/2018  . Peripheral edema 01/16/2018   . SOB (shortness of breath) 01/15/2018  . Dysphonia 12/07/2017  . Encounter for well adult exam with abnormal findings 11/08/2017  . 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 Beachem polyp 08/11/2016  . Abdominal pain, epigastric   . Dysphagia   . [place] cancer screening   . Prediabetes 07/01/2016  . ACE-inhibitor cough 12/05/2015  . Restless legs syndrome 10/08/2015  . Venous stasis dermatitis of both lower extremities 04/26/2015  . 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 AR (allergic rhinitis) 10/17/2014  . Fatty liver 10/17/2014  . Chronic pain syndrome 09/19/2014  . Post-traumatic osteoarthritis of both knees 09/19/2014  . Spinal stenosis 09/19/2014  . Depression, major, single episode, complete remission ([place]) 09/19/2014  . Narcotic dependence ([place]) 09/19/2014  . Spondylosis of 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 03/09/2012  . Lumbar disc disease 03/09/2012  . Chronic pain 03/09/2012  . Morbid obesity ([place]) 06/17/2011  . Chronic low back pain 12/28/2008  . Anxiety state 12/06/2007  . Depression 12/06/2007  . Benign hypertension 12/06/2007  . Gastroesophageal reflux disease without esophagitis 12/06/2007  . LOW BACK PAIN 12/06/2007   PCP:  Biagio Borg, MD Pharmacy:   [facility] [phone] [place], [place] - [address] [place] [address] Phone: [phone] Fax: [phone]  [facility] [phone] [place], [place] [address] Phone: [phone] Fax: [place], [place] - [address] [place] [address] Phone: [phone] Fax: [phone]  [facility] [place], [address] [place] [address] Phone: [phone] Fax: [place], [place] [address] [place] [address] Phone: [phone] Fax: [phone]  [facility] [address] [place] [address] Phone: [phone] Fax: [phone]     Social Determinants of Health (SDOH) Interventions    Readmission Risk Interventions Readmission Risk Prevention Plan 04/11/2021  Transportation Screening Complete  PCP or Specialist Appt within 3-5 Days Complete  HRI or [place] Complete  Social Work Consult for [place] Planning/Counseling Complete  Palliative Care Screening Not Applicable  Medication Review Press photographer) Complete  Some recent data might be hidden

## 2021-04-11 NOTE — TOC Transition Note (Signed)
Transition of Care [facility]) - CM/SW Discharge Note   Patient Details  Name: Maria [name] MRN: [id number] Date of Birth: Dec 05, 1956  Transition of Care [facility]) CM/SW Contact:  Maria Mayo, RN Phone Number: 04/11/2021, 11:48 AM   Clinical Narrative:    Patient is for dc today she has a scale at home but does not have a bp cuff.  She has a cane and a rollator.  She lives in apartment alone.  NCM offered choice she states Alvis Lemmings will be ok with her.  NCM made referral to [facility] with Alvis Lemmings for [facility], [place], HHOT, HHAIDE and Social Work.  Soc will begin on Monday or Tuesday of next week.  She will check with her friend to see if she can transport her home today if not she will let her Nurse know that she needs transport assist.  Patient states she needs a script for glucometer.  NCM notified MD of this information.   Final next level of care: [place] Barriers to Discharge: No Barriers Identified   Patient Goals and CMS Choice Patient states their goals for this hospitalization and ongoing recovery are:: return home CMS Medicare.gov Compare Post Acute Care list provided to:: Patient Choice offered to / list presented to : Patient  Discharge Placement                       Discharge Plan and Services In-house Referral: NA Discharge Planning Services: CM Consult Post Acute Care Choice: Home Health            DME Agency: NA       HH Arranged: RN,Disease Management,PT,OT,Nurse's Aide,Social Work [employer] Agency: [place] Date [place]: 04/11/21 Time [place]: [id number] Representative spoke with at [place]: [place] ([place]) Interventions     Readmission Risk Interventions Readmission Risk Prevention Plan 04/11/2021  Transportation Screening Complete  PCP or Specialist Appt within 3-5 Days Complete  HRI or [place] Complete  Social Work Consult for [place] Planning/Counseling  Complete  Palliative Care Screening Not Applicable  Medication Review Press photographer) Complete  Some recent data might be hidden

## 2021-04-11 NOTE — Progress Notes (Addendum)
Progress Note  Patient Name: Maria [name] Date of Encounter: 04/11/2021  Primary Cardiologist: Minus Breeding, MD  Subjective   Breathing stable, no progression in edema. Had moderate sized BM with suppository but does not feel completely evacuated. She is hoping to avoid enema. No CP.  Inpatient Medications    Scheduled Meds: . (feeding supplement) PROSource Plus  30 mL Oral BID BM  . docusate sodium  100 mg Oral BID  . enoxaparin (LOVENOX) injection  80 mg Subcutaneous Q24H  . insulin aspart  0-15 Units Subcutaneous TID WC  . insulin aspart  0-5 Units Subcutaneous QHS  . losartan  100 mg Oral Daily  . magnesium oxide  400 mg Oral Daily  . metoprolol tartrate  25 mg Oral BID  .  morphine injection  4 mg Intravenous Once  . multivitamin with minerals  1 tablet Oral Daily  . pantoprazole  40 mg Oral Daily  . polyethylene glycol  17 g Oral TID  . potassium chloride  20 mEq Oral BID  . senna-docusate  1 tablet Oral QHS  . sodium chloride flush  3 mL Intravenous Q12H  . sorbitol, milk of mag, mineral oil, glycerin (SMOG) enema  960 mL Rectal Once  . torsemide  40 mg Oral BID  . triamcinolone 0.1 % cream : eucerin   Topical BID   Continuous Infusions:  PRN Meds: acetaminophen **OR** acetaminophen, albuterol, bisacodyl, bisacodyl, clonazePAM, cyclobenzaprine, dextromethorphan-guaiFENesin, HYDROmorphone (DILAUDID) injection, ondansetron **OR** ondansetron (ZOFRAN) IV, oxyCODONE, polyethylene glycol, rOPINIRole   Vital Signs    Vitals:   04/10/21 0534 04/10/21 1058 04/10/21 1900 04/11/21 0416  BP: (!) 114/52 (!) 102/56 (!) 140/51 (!) 117/46  Pulse: 62 62 68 73  Resp: 16 18 18 16   Temp: (!) 97.5 F (36.4 C) 98.4 F (36.9 C) 98.8 F (37.1 C) 98.2 F (36.8 C)  TempSrc: Oral Oral Oral Oral  SpO2: 100% 91% 95% 96%  Weight:    (!) 162.3 kg  Height:        Intake/Output Summary (Last 24 hours) at 04/11/2021 0814 Last data filed at 04/10/2021 1900 Gross per 24 hour   Intake 0 ml  Output 1300 ml  Net -1300 ml   Last 3 Weights 04/11/2021 04/10/2021 04/09/2021  Weight (lbs) 357 lb 14.4 oz 359 lb 12.7 oz 360 lb 6.4 oz  Weight (kg) 162.342 kg 163.2 kg 163.476 kg     Telemetry    N/a - Personally Reviewed  Physical Exam   GEN: No acute distress. Body habitus c/w morbid obesity HEENT: Normocephalic, atraumatic, sclera non-icteric. Neck: No JVD or bruits. Cardiac: RRR no murmurs, rubs, or gallops.  Respiratory: Clear to auscultation bilaterally. Breathing is unlabored. GI: Soft, nontender, non-distended, BS +x 4. MS: no deformity. Extremities: No clubbing or cyanosis. No significant pitting edema - large baseline leg habitus with chronic venous stasis changes but soft Neuro:  AAOx3. Follows commands. Psych:  Responds to questions appropriately with a normal affect.  Labs    High Sensitivity Troponin:   Recent Labs  Lab 04/01/21 1410 04/01/21 1515 04/05/21 2013  TROPONINIHS 3 <2 6      Cardiac EnzymesNo results for input(s): TROPONINI in the last 168 hours. No results for input(s): TROPIPOC in the last 168 hours.   Chemistry Recent Labs  Lab 04/08/21 0342 04/09/21 0320 04/10/21 0410 04/11/21 0441  NA 136 139 141 141  K 2.8* 3.6 3.7 3.7  CL 92* 97* 99 100  CO2 37* 34* 33* 34*  GLUCOSE 123* 101* 97 109*  BUN 23 21 29* 24*  CREATININE 1.14* 1.16* 1.39* 1.22*  CALCIUM 8.4* 8.4* 8.4* 8.5*  PROT 5.9* 6.3* 6.5  --   ALBUMIN 3.3* 3.5 3.5  --   AST 20 27 34  --   ALT 19 26 37  --   ALKPHOS 57 51 60  --   BILITOT 1.0 0.6 0.7  --   GFRNONAA 54* 53* 43* 50*  ANIONGAP 7 8 9 7      Hematology Recent Labs  Lab 04/05/21 2013 04/07/21 0319 04/11/21 0441  WBC 9.7 7.3 7.6  RBC 4.00 3.80* 3.75*  HGB 11.9* 11.7* 11.4*  HCT 35.9* 34.0* 35.0*  MCV 89.8 89.5 93.3  MCH 29.8 30.8 30.4  MCHC 33.1 34.4 32.6  RDW 12.5 12.5 12.8  PLT 262 244 260    BNP Recent Labs  Lab 04/05/21 2019  BNP 13.2     DDimer No results for input(s):  DDIMER in the last 168 hours.   Radiology    DG Abd 1 View  Result Date: 04/10/2021 CLINICAL DATA:  Constipation, no bowel movement for 6 days, abdominal pain EXAM: ABDOMEN - 1 VIEW COMPARISON:  None FINDINGS: Mildly prominent stool in ascending, proximal transverse, and descending Grenz. Nonobstructive bowel gas pattern. No bowel dilatation or bowel wall thickening. Osseous structures unremarkable. Atherosclerotic calcifications aorta. No urinary tract calcification. IMPRESSION: Mildly prominent stool in proximal 2/3 [place]. Electronically Signed   By: Lavonia Dana M.D.   On: 04/10/2021 15:40   VAS [place] LOWER EXTREMITY VENOUS (DVT)  Result Date: 04/09/2021  Lower Venous DVT Study Indications: Swelling.  Limitations: Body habitus and poor ultrasound/tissue interface. Comparison Study: No prior studies. Performing Technologist: Darlin Coco RDMS,RVT  Examination Guidelines: A complete evaluation includes B-mode imaging, spectral Doppler, color Doppler, and power Doppler as needed of all accessible portions of each vessel. Bilateral testing is considered an integral part of a complete examination. Limited examinations for reoccurring indications may be performed as noted. The reflux portion of the exam is performed with the patient in reverse Trendelenburg.  +---------+---------------+---------+-----------+----------+--------------+ RIGHT    CompressibilityPhasicitySpontaneityPropertiesThrombus Aging +---------+---------------+---------+-----------+----------+--------------+ CFV      Full           Yes      Yes                                 +---------+---------------+---------+-----------+----------+--------------+ SFJ      Full                                                        +---------+---------------+---------+-----------+----------+--------------+ FV Prox  Full                                                         +---------+---------------+---------+-----------+----------+--------------+ FV Mid   Full                                                        +---------+---------------+---------+-----------+----------+--------------+  FV DistalFull                                                        +---------+---------------+---------+-----------+----------+--------------+ PFV      Full                                                        +---------+---------------+---------+-----------+----------+--------------+ POP      Full           Yes      Yes                                 +---------+---------------+---------+-----------+----------+--------------+ PTV      Full                                                        +---------+---------------+---------+-----------+----------+--------------+ PERO                    Yes      Yes                                 +---------+---------------+---------+-----------+----------+--------------+   +---------+---------------+---------+-----------+----------+--------------+ LEFT     CompressibilityPhasicitySpontaneityPropertiesThrombus Aging +---------+---------------+---------+-----------+----------+--------------+ CFV      Full           Yes      Yes                                 +---------+---------------+---------+-----------+----------+--------------+ SFJ      Full                                                        +---------+---------------+---------+-----------+----------+--------------+ FV Prox  Full                                                        +---------+---------------+---------+-----------+----------+--------------+ FV Mid   Full                                                        +---------+---------------+---------+-----------+----------+--------------+ FV DistalFull                                                         +---------+---------------+---------+-----------+----------+--------------+  PFV      Full                                                        +---------+---------------+---------+-----------+----------+--------------+ POP      Full           Yes      Yes                                 +---------+---------------+---------+-----------+----------+--------------+ PTV      Full                                                        +---------+---------------+---------+-----------+----------+--------------+ PERO                    Yes      Yes                                 +---------+---------------+---------+-----------+----------+--------------+     Summary: RIGHT: - There is no evidence of deep vein thrombosis in the lower extremity. However, portions of this examination were limited- see technologist comments above.  - No cystic structure found in the popliteal fossa.  LEFT: - There is no evidence of deep vein thrombosis in the lower extremity. However, portions of this examination were limited- see technologist comments above.  - No cystic structure found in the popliteal fossa.  *See table(s) above for measurements and observations. Electronically signed by Jamelle Haring on 04/09/2021 at 4:29:37 PM.    Final     Cardiac Studies   2D echo 03/08/21 1. Left ventricular ejection fraction, by estimation, is 60 to 65%. The left ventricle has normal function. The left ventricle has no regional wall motion abnormalities. Left ventricular diastolic parameters are consistent with Grade I diastolic dysfunction (impaired relaxation). Elevated left ventricular end-diastolic  pressure.  2. Right ventricular systolic function is normal. The right ventricular size is normal. Tricuspid regurgitation signal is inadequate for assessing PA pressure.  3. The mitral valve is normal in structure. No evidence of mitral valve regurgitation. No evidence of mitral stenosis.  4. The aortic valve is  normal in structure. Aortic valve regurgitation is not visualized. No aortic stenosis is present.  5. The inferior vena cava is normal in size with greater than 50% respiratory variability, suggesting right atrial pressure of 3 mmHg.  Patient Profile     65 y.o.femalewithchronic diastolic CHF, HTN, HLD, DM type 2,chronic pain, reportedPVD,super morbid obesity,fibromyalgia, restless legs,mild OSAand chronic back pain. Recently admitted 02/2021 for reported acute on chronic diastolic CHF with weight gain/edema. Seen in the ED back for similar symptoms 04/01/21. Returned back tot he ER 04/06/21 with leg cramping, SOB, weight gain, swelling and edema. Labs revealed hyponatremia (Na 129), hypokalemia (2.8), AKI (Cr 1.4 from baseline 1.).She has not followed with cardiology outpatient, though is scheduled to see Dr. Gwenlyn Found 05/13/21 to establish care. Her last echocardiogram 03/08/21 showed EF 60-65%, G1DD, no RWMA, elevated LVEDP, and no significant valvular abnormalities. Does not appear to have had  prior ischemic testing though does have evidence of prior coronary artery calcifications (specifically LAD) noted on prior CTA Chest in 2020. LE venous duplex 4/20 was negative.  Assessment & Plan    1. Chronic diastolic CHF - volume status has been challenging to gauge given obesitybut lungs are clear and limbs remain without pitting edema -suspect there has been a component of OSA/OHS playing a role in her DOE and chronic venous stasis issues contributing to her LE edema - she was felt to be overdiuresed prior to admission(weights extremely variable in EMR - most recent peak 373lb in 02/2021, discharged at 357lb - current weight 357.9 today - Lasix has been changed to torsemide this admission - feeling well on this regimen so would continue - continue KCL 2meq BID and MagOx 400mg  daily - in the future if she has continued issues with DOE/fluid retention would benefit from a more definitive right  heart cath   2. Chestpressure in patient with coronary artery calcifications on CT scan -noted rare episodes of chest pressure with prolonged exertion. Could be due to demand processes.  - HsTroponin negative this admission and EKG is non-ischemic - nuclear stress testing and coronary CTA would be challenging given body habitus  - this has improved with diuresis so would follow symptoms as outpatient  3. Essential HTN - BP controlled  4. Hyperlipidemia -LDL 83 12/2020; goal <70. She has made significant dietary changes since that time.  -continue to encourage dietary/lifestyle modifications to promote weight loss  5. Severe morbid obesity  - her medical problems and dyspnea will likely continue to be an issue if this is not addressed - consider referral to medical weight management as OP - consider Ozempic given obesity and DM  6. Suspected OSA -reports sleep study 1 year ago did not show OSA - do not see record of this, however study in 2019 suggested mild OSA. Possible this has progressed.  -may benefit from a repeat sleep study,can address as OP  7. 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 Constipation - per IM  Has HFTOC f/u 4/27 and f/u with Dr. Percival Spanish on 5/20. Pt aware. Also added to AVS. Have also written consult request for care management for home health and home blood draw availability if needed. Would recommend primary team confirm home health in place prior to discharge - anticipate cardiology sign off today.  For questions or updates, please contact [place] Please consult [email or web address] for contact info under Cardiology/STEMI.  Signed, Charlie Pitter, PA-C 04/11/2021, 8:14 AM    History and all data above reviewed.  Patient examined.  I  agree with the findings as above.  The patient exam reveals COR:RRR  ,  Lungs: Clear  ,  Abd: Positive bowel sounds, no rebound no guarding , Ext Trace edema  .  All available labs, radiology testing, previous records reviewed. Agree with documented assessment and plan.     [facility] will sign off.   Medication Recommendations:  As on [facility] Other recommendations (labs, testing, etc):    Needs home health lab draws. Follow up as an outpatient:  She has follow up scheduled.  If we can arrange this to be video virtual visit we will do that.    Jeneen Rinks Callan Yontz  10:05 AM  04/11/2021

## 2021-04-11 NOTE — Progress Notes (Signed)
Occupational Therapy Treatment Patient Details Name: Maria [name] MRN: [id number] DOB: 1956/10/14 Today's Date: 04/11/2021    History of present illness 65 y.o. female admitted with CHF exacerbation. PMH: chronic low back pain; HTN; HLD; DM; PVD; and chronic diastolic CHF presenting with SOB.   OT comments  Pt making steady progress towards OT goals this session. Pt completed functional mobility from recliner <>bathroom with cane and gross supervision, pt able to completed posterior pericare with set- up assist via sit<>stand. Pt additionally completed UB/LB bathing at sink as well as grooming tasks with pt needing up to MAX A for LB bathing d/t cleanliness. Pt on RA during session with SpO2 96% during session. Pt taking needed seated rest breaks during ADLs as energy conservation strategy with good awareness.Pt would continue to benefit from skilled occupational therapy while admitted and after d/c to address the below listed limitations in order to improve overall functional mobility and facilitate independence with BADL participation. DC plan remains appropriate, will follow acutely per POC.     Follow Up Recommendations  Home health OT    Equipment Recommendations  None recommended by OT    Recommendations for Other Services      Precautions / Restrictions Precautions Precautions: Fall Restrictions Weight Bearing Restrictions: No       Mobility Bed Mobility               General bed mobility comments: OOB in recliner upon COTA arrival and returned to recliner at end of session    Transfers Overall transfer level: Needs assistance Equipment used: Straight cane Transfers: Sit to/from Stand Sit to Stand: Supervision         General transfer comment: supervision from recliner and toilet, good hand placement noted    Balance Overall balance assessment: Needs assistance Sitting-balance support: Feet supported;No upper extremity supported Sitting balance-Leahy  Scale: Good     Standing balance support: During functional activity;Single extremity supported Standing balance-Leahy Scale: Fair Standing balance comment: at least one UE supported during standing ADLs at sink                           ADL either performed or assessed with clinical judgement   ADL Overall ADL's : Needs assistance/impaired     Grooming: Wash/dry face;Oral care;Sitting;Supervision/safety;Standing Grooming Details (indicate cue type and reason): pt alternating between sitting<>standing d/t fatigue Upper Body Bathing: Supervision/ safety;Set up;Sitting Upper Body Bathing Details (indicate cue type and reason): sitting at sink Lower Body Bathing: Sit to/from stand;Min guard;Maximal assistance Lower Body Bathing Details (indicate cue type and reason): pt required MAX A to wash buttock for cleanliness, pt unable to reach fully to buttock without AE Upper Body Dressing : Set up;Sitting       Toilet Transfer: Supervision/safety;Ambulation;Regular Toilet;Grab bars (cane) Toilet Transfer Details (indicate cue type and reason): gross supervision for ambualtion from recliner<>BR, using grab bars as needed for balance Toileting- Clothing Manipulation and Hygiene: Supervision/safety;Set up;Sit to/from stand Toileting - Clothing Manipulation Details (indicate cue type and reason): set- up of wash cloths after BM     Functional mobility during ADLs: Supervision/safety;Cane General ADL Comments: pt completing functional mobility, toileting/ hygiene, seated grooming and UB/LB bathing     Vision       Perception     Praxis      Cognition Arousal/Alertness: Awake/alert Behavior During Therapy: WFL for tasks assessed/performed;Anxious (moments of anxiety as pt concerned about new occurence of pts hand shaking when  drinking coffee, initially anxious about not having BM, but able to defecate during session) Overall Cognitive Status: Within Functional Limits for tasks  assessed                                          Exercises     Shoulder Instructions       General Comments pt had BM during session- alerted RN    Pertinent Vitals/ Pain       Pain Assessment: Faces Faces Pain Scale: No hurt  Home Living                                          Prior Functioning/Environment              Frequency  Min 2X/week        Progress Toward Goals  OT Goals(current goals can now be found in the care plan section)  Progress towards OT goals: Progressing toward goals  Acute Rehab OT Goals Patient Stated Goal: to figure out why her hands are shaking OT Goal Formulation: With patient Time For Goal Achievement: 04/21/21 Potential to Achieve Goals: Good  Plan Discharge plan remains appropriate;Frequency remains appropriate    Co-evaluation                 AM-PAC OT "6 Clicks" Daily Activity     Outcome Measure   Help from another person eating meals?: None Help from another person taking care of personal grooming?: None Help from another person toileting, which includes using toliet, bedpan, or urinal?: A Little Help from another person bathing (including washing, rinsing, drying)?: A Little Help from another person to put on and taking off regular upper body clothing?: None Help from another person to put on and taking off regular lower body clothing?: A Little 6 Click Score: 21    End of Session Equipment Utilized During Treatment: Other (comment) (cane)  OT Visit Diagnosis: Unsteadiness on feet (R26.81);Muscle weakness (generalized) (M62.81)   Activity Tolerance Patient tolerated treatment well   Patient Left in chair;with call bell/phone within reach   Nurse Communication Mobility status        Time: 3710-6269 OT Time Calculation (min): 32 min  Charges: OT General Charges $OT Visit: 1 Visit OT Treatments $Self Care/Home Management : 23-37 mins Harley Alto., COTA/L Acute  Rehabilitation Services [phone]    Precious Haws 04/11/2021, 9:17 AM

## 2021-04-11 NOTE — Progress Notes (Signed)
Patient ordered enema this evening for constipation.  After talking with patient, patient very reluctant to take enema calling it "A last resort".  Suppository ordered and given with moderate effect. Patient reports some relief from constipation and did have 1 medium BM.

## 2021-04-11 NOTE — Progress Notes (Signed)
D/C instructions given and reviewed. IV removed, tolerated well. Pt awaiting friend to transport home.

## 2021-04-11 NOTE — Progress Notes (Signed)
Dr. Percival Spanish requested 5/20 visit be changed to virtual so I have done so. Pt has Android phone with MyChart app already installed. Phone number confirmed in appt notes. She is aware someone will call her from the office 15 minutes prior to appt time to help her join the visit. Will still keep [place] f/u as scheduled.

## 2021-04-11 NOTE — Discharge Summary (Signed)
Physician Discharge Summary  Ali Mclaurin Mountjoy ZOX:[id number] DOB: [birth year] DOA: 04/05/2021  PCP: Biagio Borg, MD  Admit date: 04/05/2021 Discharge date: 04/11/2021  Time spent: 40 minutes  Recommendations for Outpatient Follow-up:  1. Follow outpatient CBC/CMP 2. Follow with cardiology outpatient 3. Follow volume status, creatinine, lytes on torsemide  Discharge Diagnoses:  Principal Problem:   AKI (acute kidney injury) ([place]) Active Problems:   Benign hypertension   Chronic low back pain   Morbid obesity (H[facility])   Venous stasis dermatitis of both lower extremities   HLD (hyperlipidemia)   Diastolic congestive heart failure ([place])   Edema   Discharge Condition: stable  Diet recommendation: heart healthy, low salt  Filed Weights   04/09/21 0007 04/10/21 0238 04/11/21 0416  Weight: (!) 163.5 kg (!) 163.2 kg (!) 162.3 kg    History of present illness:  65 year old with history of chronic low back pain, HTN, DM2, HLD, PVD, diastolic CHF admitted for shortness of breath and weight gain discharged on Lasix now presenting again with shortness of breath, lower extremity swelling and laboratory abnormality.  She was admitted with concern for overdiuresis with AKI and electrolyte abnormalities.  She was transitioned to torsemide.  Electrolytes were repleted.  Stable on 4/22 and discharged.  See below for additional details  Hospital Course:  Abdominal Discomfort  Constipation KUB with mildly prominent stool in proximal 2/3 [place]  Improved on day of discharge, she's had multiple BM's overnight  Severe hypokalemia, 2.8 -Recurrent hypokalemia secondary to diuretic use --improved -- needs supplementation with diuresis (per cardiology, adding kcl 20 meq BID)  Acute kidney injury, improved -Baseline creatinine 1.0. Admission creatinine 1.4.  - fluctuating while here, 1.2 on day of discharge - follow outpatient with torsemide  Chronic diastolic CHF -Echo 07/1190-YN 60 to  65%, grade 1 DD -Resume Lasix 40 mg twice daily -> transitioned to torsemide 40 mg twice daily - continue kcl 20 meq bid and magox 400 mg daily with this  -lostartan, metoprolol -We will consult cardiology for diuretic management and transition to outpatient follow-up.  Appreciate assistance.  May need right heart cath in the future.    Chest Pressure - per cardiology, outpatient follow up - negative troponin - denies any CP to me today  Stasis dermatitis Bilateral lower extremity swelling -Secondary to chronic lower extremity swelling. Advise ambulation. Recently was on Keflex but stopped on admission due to no evidence of infection. -Elevate legs  HTN -Losartan 100 mg daily  Chronic pain -Continue home regimen of Klonopin, Flexeril, oxycodone, Dilaudid and Requip use as needed. Bowel regimen.  Hyperlipidemia -Not on any home medications  Diabetes mellitus type 2 -A1c 6.5. Holding metformin -Insulin sliding scale and Accu-Chek.  Obesity -Body mass index is 60.91 kg/m..  -Weight loss should be encouraged -Mobility for her is key or she will lose her ability to mobilize soon -Outpatient PCP/bariatric medicine f/u encouraged  Procedures: Venous US Summary:  RIGHT:  - There is no evidence of deep vein thrombosis in the lower extremity.  However, portions of this examination were limited- see technologist  comments above.    - No cystic structure found in the popliteal fossa.    LEFT:  - There is no evidence of deep vein thrombosis in the lower extremity.  However, portions of this examination were limited- see technologist  comments above.    - No cystic structure found in the popliteal fossa  Consultations:  cardiology  Discharge Exam: Vitals:   04/11/21 0416 04/11/21 1120  BP: Marland Kitchen)  117/46 (!) 125/50  Pulse: 73 60  Resp: 16 18  Temp: 98.2 F (36.8 C) (!) 97.5 F (36.4 C)  SpO2: 96% 98%   Feeling better Well enough to go home Had BM,  belly feels better  General: No acute distress. Cardiovascular: Heart sounds show Rylin Seavey regular rate, and rhythm Lungs: Clear to auscultation bilaterally Abdomen: Soft, nontender, nondistended  Neurological: Alert and oriented 3. Moves all extremities 4 with equal strength. Cranial nerves II through XII grossly intact. Extremities: bilateral LE edema with mild erythema - doesn't appear c/w cellulitis  Discharge Instructions   Discharge Instructions    (HEART FAILURE PATIENTS) Call MD:  Anytime you have any of the following symptoms: 1) 3 pound weight gain in 24 hours or 5 pounds in 1 week 2) shortness of breath, with or without Khasir Woodrome dry hacking cough 3) swelling in the hands, feet or stomach 4) if you have to sleep on extra pillows at night in order to breathe.   Complete by: As directed    AMB Referral to [place]   Complete by: As directed    Embedded with [place] [facility], Dr. Cathlean Cower is PCP  Please assign to [place] Coordinator for chronic care management follow up calls and assess for further needs.  Questions please call:   Natividad Brood, RN BSN [facility] Liaison  [phone] business mobile phone Toll free office [phone]  Fax number: [phone] [email or web address]   Reason for Referral: Embedded Chronic Care Management Services (Dept. specific)   Disease management services needed: Nurse Case Manager   Diagnoses of: Heart Failure   Expected date of contact: Routine - 71 Days   Call MD for:  difficulty breathing, headache or visual disturbances   Complete by: As directed    Call MD for:  extreme fatigue   Complete by: As directed    Call MD for:  hives   Complete by: As directed    Call MD for:  persistant dizziness or light-headedness   Complete by: As directed    Call MD for:  persistant nausea and vomiting   Complete by: As directed    Call MD for:  redness,  tenderness, or signs of infection (pain, swelling, redness, odor or green/yellow discharge around incision site)   Complete by: As directed    Call MD for:  severe uncontrolled pain   Complete by: As directed    Call MD for:  temperature >100.4   Complete by: As directed    Diet - low sodium heart healthy   Complete by: As directed    Discharge instructions   Complete by: As directed    You were seen for low potassium and Geovanni Rahming heart failure exacerbation.  Your lasix was transitioned to torsemide and we'll send you with potassium supplementation and magnesium supplementation as well.    You've also been started on metoprolol.  Cardiology is planning to follow up with you as an outpatient.    Return for new, recurrent, or worsening symptoms.  Please ask your PCP to request records from this hospitalization so they know what was done and what the next steps will be.   Increase activity slowly   Complete by: As directed      Allergies as of 04/11/2021      Reactions   Amoxicillin-pot Clavulanate Nausea And Vomiting   Projectile vomiting Did it involve swelling of the face/tongue/throat, SOB, or low BP? No Did it involve sudden or  severe rash/hives, skin peeling, or any reaction on the inside of your mouth or nose? No Did you need to seek medical attention at [facility] hospital or doctor's office? No When did it last happen?20-30 years If all above answers are "NO", may proceed with cephalosporin use.   Adhesive [tape] Other (See Comments)   Tears skin off - use paper tape    Codeine Other (See Comments)   hallucinations   Crestor [rosuvastatin Calcium]    Severe muscle cramps   Lactose Intolerance (gi) Nausea And Vomiting   Morphine And Related Nausea And Vomiting, Other (See Comments)   Migraine headaches   Vicodin [hydrocodone-acetaminophen] Other (See Comments)   hallucinations   Latex Rash      Medication List    STOP taking these medications   cephALEXin 500 MG  capsule Commonly known as: KEFLEX   furosemide 20 MG tablet Commonly known as: LASIX   metolazone 2.5 MG tablet Commonly known as: ZAROXOLYN   potassium chloride 10 MEQ CR capsule Commonly known as: MICRO-K     TAKE these medications   acetaminophen 325 MG tablet Commonly known as: TYLENOL Take 650 mg by mouth every 6 (six) hours as needed for headache (pain).   albuterol 108 (90 Base) MCG/ACT inhaler Commonly known as: ProAir HFA Inhale 2 puffs into the lungs every 6 (six) hours as needed for wheezing or shortness of breath.   clonazePAM 1 MG tablet Commonly known as: KLONOPIN TAKE 1 TABLET BY MOUTH 2 TIMES Shemekia Patane DAY AS NEEDED FOR ANXIETY What changed: See the new instructions.   cyclobenzaprine 10 MG tablet Commonly known as: FLEXERIL TAKE 1 TABLET BY MOUTH 2 TIMES Rogue Pautler DAY AS NEEDED FOR MUSCLE SPASMS What changed: See the new instructions.   GAS-X EXTRA STRENGTH PO Take 1 tablet by mouth daily as needed (gas).   Linzess 145 MCG Caps capsule Generic drug: linaclotide Take 145 mcg by mouth daily as needed (constipation).   loperamide 2 MG capsule Commonly known as: IMODIUM Take 2 mg by mouth as needed for diarrhea or loose stools.   losartan 100 MG tablet Commonly known as: COZAAR TAKE 1 TABLET BY MOUTH DAILY   magnesium oxide 400 (241.3 Mg) MG tablet Commonly known as: MAG-OX Take 1 tablet (400 mg total) by mouth daily. Start taking on: April 12, 2021   metFORMIN 500 MG tablet Commonly known as: Glucophage Take 1 tablet (500 mg total) by mouth 2 (two) times daily with Yonna Alwin meal.   metoprolol tartrate 25 MG tablet Commonly known as: LOPRESSOR Take 1 tablet (25 mg total) by mouth 2 (two) times daily.   Narcan 4 MG/0.1ML Liqd nasal spray kit Generic drug: naloxone Place 0.4 mg into the nose daily as needed (opioid overdose).   ondansetron 4 MG disintegrating tablet Commonly known as: Zofran ODT Take 1 tablet (4 mg total) by mouth every 4 (four) hours as needed  for nausea or vomiting.   Oxycodone HCl 10 MG Tabs Take 1 tablet (10 mg total) 4 (four) times daily by mouth. Per [facility] What changed:   when to take this  reasons to take this   pantoprazole 40 MG tablet Commonly known as: PROTONIX TAKE ONE TABLET BY MOUTH DAILY   polyethylene glycol 17 g packet Commonly known as: MIRALAX / GLYCOLAX Take 17 g by mouth daily as needed for mild constipation.   potassium chloride SA 20 MEQ tablet Commonly known as: KLOR-CON Take 1 tablet (20 mEq total) by mouth 2 (two) times  daily.   rOPINIRole 3 MG tablet Commonly known as: REQUIP TAKE 1 TABLET BY MOUTH NIGHTLY AT BEDTIME AS NEEDED What changed:   how much to take  how to take this  when to take this  reasons to take this  additional instructions   Torsemide 40 MG Tabs Take 40 mg by mouth 2 (two) times daily.   VISINE OP Place 1 drop into both eyes daily as needed (dry eyes).      Allergies  Allergen Reactions  . Amoxicillin-Pot Clavulanate Nausea And Vomiting    Projectile vomiting Did it involve swelling of the face/tongue/throat, SOB, or low BP? No Did it involve sudden or severe rash/hives, skin peeling, or any reaction on the inside of your mouth or nose? No Did you need to seek medical attention at [facility] hospital or doctor's office? No When did it last happen?20-30 years If all above answers are "NO", may proceed with cephalosporin use.   . Adhesive [Tape] Other (See Comments)    Tears skin off - use paper tape   . Codeine Other (See Comments)    hallucinations  . Crestor [Rosuvastatin Calcium]     Severe muscle cramps  . Lactose Intolerance (Gi) Nausea And Vomiting  . Morphine And Related Nausea And Vomiting and Other (See Comments)    Migraine headaches  . Vicodin [Hydrocodone-Acetaminophen] Other (See Comments)    hallucinations  . Latex Rash    Follow-up Information    [facility]. Go to.    Specialty: Cardiology Why: 04/16/21 AT 10AM. [facility] ([facility] T[facility]. Bring all medications and pill box with you to your appointment, you will see Ixel Boehning HF doctor and HF pharmacist- this will be Giovanna Kemmerer 1 hour appointment. [facility] [employer] will pick you up.  Contact information: [address] [id number] YC [id number] [place]       Minus Breeding, MD Follow up.   Specialty: Cardiology Why: **VIRTUAL VISIT** scheduled with [facility] on May 09, 2021 at 9:00 AM. Someone from Dr. Rosezella [place] office will call you about 15 minutes beforehand to help you join the visit from your MyChart app. Contact information: [place] [address] [phone]                The results of significant diagnostics from this hospitalization (including imaging, microbiology, ancillary and laboratory) are listed below for reference.    Significant Diagnostic Studies: DG Chest 2 View  Result Date: 04/05/2021 CLINICAL DATA:  Shortness of breath EXAM: CHEST - 2 VIEW COMPARISON:  04/01/2021 FINDINGS: Heart is normal size. No confluent airspace opacities or effusions. No acute bony abnormality. IMPRESSION: No active cardiopulmonary disease. Electronically Signed   By: Rolm Baptise M.D.   On: 04/05/2021 20:43   DG Chest 2 View  Result Date: 04/01/2021 CLINICAL DATA:  Shortness of breath EXAM: CHEST - 2 VIEW COMPARISON:  March 06, 2021. FINDINGS: Similar enlargement of the cardiac silhouette. Similar central pulmonary vascular congestion. No consolidation. No visible pleural effusions or pneumothorax. Partially imaged cervical ACDF. IMPRESSION: Similar cardiomegaly and central vascular congestion without overt pulmonary edema. Electronically Signed   By: Margaretha Sheffield MD   On: 04/01/2021 14:16   DG Abd 1 View  Result Date: 04/10/2021 CLINICAL DATA:  Constipation, no bowel movement for 6 days, abdominal pain EXAM:  ABDOMEN - 1 VIEW COMPARISON:  None FINDINGS: Mildly prominent stool in ascending, proximal transverse, and descending [place].  Nonobstructive bowel gas pattern. No bowel dilatation or bowel wall thickening. Osseous structures unremarkable. Atherosclerotic calcifications aorta. No urinary tract calcification. IMPRESSION: Mildly prominent stool in proximal 2/3 [place]. Electronically Signed   By: Lavonia Dana M.D.   On: 04/10/2021 15:40   VAS [place] LOWER EXTREMITY VENOUS (DVT)  Result Date: 04/09/2021  Lower Venous DVT Study Indications: Swelling.  Limitations: Body habitus and poor ultrasound/tissue interface. Comparison Study: No prior studies. Performing Technologist: Darlin Coco RDMS,RVT  Examination Guidelines: Kobe Jansma complete evaluation includes B-mode imaging, spectral Doppler, color Doppler, and power Doppler as needed of all accessible portions of each vessel. Bilateral testing is considered an integral part of Suni Jarnagin complete examination. Limited examinations for reoccurring indications may be performed as noted. The reflux portion of the exam is performed with the patient in reverse Trendelenburg.  +---------+---------------+---------+-----------+----------+--------------+ RIGHT    CompressibilityPhasicitySpontaneityPropertiesThrombus Aging +---------+---------------+---------+-----------+----------+--------------+ CFV      Full           Yes      Yes                                 +---------+---------------+---------+-----------+----------+--------------+ SFJ      Full                                                        +---------+---------------+---------+-----------+----------+--------------+ FV Prox  Full                                                        +---------+---------------+---------+-----------+----------+--------------+ FV Mid   Full                                                        +---------+---------------+---------+-----------+----------+--------------+  FV DistalFull                                                        +---------+---------------+---------+-----------+----------+--------------+ PFV      Full                                                        +---------+---------------+---------+-----------+----------+--------------+ POP      Full           Yes      Yes                                 +---------+---------------+---------+-----------+----------+--------------+ PTV      Full                                                        +---------+---------------+---------+-----------+----------+--------------+  PERO                    Yes      Yes                                 +---------+---------------+---------+-----------+----------+--------------+   +---------+---------------+---------+-----------+----------+--------------+ LEFT     CompressibilityPhasicitySpontaneityPropertiesThrombus Aging +---------+---------------+---------+-----------+----------+--------------+ CFV      Full           Yes      Yes                                 +---------+---------------+---------+-----------+----------+--------------+ SFJ      Full                                                        +---------+---------------+---------+-----------+----------+--------------+ FV Prox  Full                                                        +---------+---------------+---------+-----------+----------+--------------+ FV Mid   Full                                                        +---------+---------------+---------+-----------+----------+--------------+ FV DistalFull                                                        +---------+---------------+---------+-----------+----------+--------------+ PFV      Full                                                        +---------+---------------+---------+-----------+----------+--------------+ POP      Full           Yes      Yes                                  +---------+---------------+---------+-----------+----------+--------------+ PTV      Full                                                        +---------+---------------+---------+-----------+----------+--------------+ PERO                    Yes      Yes                                 +---------+---------------+---------+-----------+----------+--------------+  Summary: RIGHT: - There is no evidence of deep vein thrombosis in the lower extremity. However, portions of this examination were limited- see technologist comments above.  - No cystic structure found in the popliteal fossa.  LEFT: - There is no evidence of deep vein thrombosis in the lower extremity. However, portions of this examination were limited- see technologist comments above.  - No cystic structure found in the popliteal fossa.  *See table(s) above for measurements and observations. Electronically signed by Jamelle Haring on 04/09/2021 at 4:29:37 PM.    Final     Microbiology: Recent Results (from the past 240 hour(s))  SARS CORONAVIRUS 2 (TAT 6-24 HRS) Nasopharyngeal Nasopharyngeal Swab     Status: None   Collection Time: 04/06/21  6:56 PM   Specimen: Nasopharyngeal Swab  Result Value Ref Range Status   SARS Coronavirus 2 NEGATIVE NEGATIVE Final    Comment: (NOTE) SARS-CoV-2 target nucleic acids are NOT DETECTED.  The SARS-CoV-2 RNA is generally detectable in upper and lower respiratory specimens during the acute phase of infection. Negative results do not preclude SARS-CoV-2 infection, do not rule out co-infections with other pathogens, and should not be used as the sole basis for treatment or other patient management decisions. Negative results must be combined with clinical observations, patient history, and epidemiological information. The expected result is Negative.  Fact Sheet for Patients: SugarRoll.be  Fact Sheet for Healthcare  Providers: https://www.woods-mathews.com/  This test is not yet approved or cleared by the [place] FDA and  has been authorized for detection and/or diagnosis of SARS-CoV-2 by FDA under an Emergency Use Authorization (EUA). This EUA will remain  in effect (meaning this test can be used) for the duration of the COVID-19 declaration under Se ction 564(b)(1) of the Act, 21 U.S.C. section 360bbb-3(b)(1), unless the authorization is terminated or revoked sooner.  Performed at [facility], [place] [address], [place], [place] [address]      Labs: Basic Metabolic Panel: Recent Labs  Lab 04/06/21 0731 04/07/21 0319 04/07/21 1816 04/08/21 0342 04/09/21 0320 04/10/21 0410 04/11/21 0441  NA  --  134* 134* 136 139 141 141  K  --  2.5* 4.4 2.8* 3.6 3.7 3.7  CL  --  88* 90* 92* 97* 99 100  CO2  --  36* 30 37* 34* 33* 34*  GLUCOSE  --  113* 135* 123* 101* 97 109*  BUN  --  _0 29* 24*  CREATININE  --  1.01* 1.30* 1.14* 1.16* 1.39* 1.22*  CALCIUM  --  8.7* 8.6* 8.4* 8.4* 8.4* 8.5*  MG 1.7 2.1  --  1.9 1.8 2.1 2.1  PHOS 3.2  --   --   --   --   --   --    Liver Function Tests: Recent Labs  Lab 04/08/21 0342 04/09/21 0320 04/10/21 0410  AST 20 27 34  ALT 19 26 37  ALKPHOS 57 51 60  BILITOT 1.0 0.6 0.7  PROT 5.9* 6.3* 6.5  ALBUMIN 3.3* 3.5 3.5   No results for input(s): LIPASE, AMYLASE in the last 168 hours. No results for input(s): AMMONIA in the last 168 hours. CBC: Recent Labs  Lab 04/05/21 2013 04/07/21 0319 04/11/21 0441  WBC 9.7 7.3 7.6  NEUTROABS  --   --  3.7  HGB 11.9* 11.7* 11.4*  HCT 35.9* 34.0* 35.0*  MCV 89.8 89.5 93.3  PLT 262 244 260   Cardiac Enzymes: No results for input(s): CKTOTAL, CKMB, CKMBINDEX,  TROPONINI in the last 168 hours. BNP: BNP (last 3 results) Recent Labs    03/06/21 1808 04/01/21 1411 04/05/21 2019  BNP 12.6 16.4 13.2    ProBNP (last 3 results) No results for input(s): PROBNP in the last 8760  hours.  CBG: Recent Labs  Lab 04/10/21 1059 04/10/21 1650 04/10/21 2231 04/11/21 0535 04/11/21 1116  GLUCAP 112* 116* 125* 113* 126*       Signed:  Fayrene Helper MD.  [facility] 04/11/2021, 11:20 AM

## 2021-04-14 ENCOUNTER — Telehealth: Payer: Self-pay | Admitting: *Deleted

## 2021-04-14 ENCOUNTER — Telehealth: Payer: Self-pay

## 2021-04-14 NOTE — Telephone Encounter (Signed)
Transition Care Management Follow-up Telephone Call  Date of discharge and from where: 04/11/2021 from [facility]  How have you been since you were released from the hospital? Feeling better, no swelling   Any questions or concerns? Yes, since being d/c from the hospital, "My hands are shaking, not sure if its coming from the strong pain medication."  Items Reviewed:  Did the pt receive and understand the discharge instructions provided? Yes   Medications obtained and verified? Yes   Other? No   Any new allergies since your discharge? No   Dietary orders reviewed? Yes, low sodium, heart healthy diet  Do you have support at home? No lives alone  Home Care and Equipment/Supplies: Were home health services ordered? no If so, what is the name of the agency? n/a  Has the agency set up a time to come to the patient's home? not applicable Were any new equipment or medical supplies ordered?  No What is the name of the medical supply agency? n/a Were you able to get the supplies/equipment? not applicable Do you have any questions related to the use of the equipment or supplies? No  Functional Questionnaire: (I = Independent and D = Dependent) ADLs: I  Bathing/Dressing- I  Meal Prep- I  Eating- I  Maintaining continence- I  Transferring/Ambulation- I  Managing Meds- I  Follow up appointments reviewed:   PCP Hospital f/u appt confirmed? Yes  Scheduled to see Cathlean Cower, MD on 04/18/2021 @ 11:20 am.  [facility] f/u appt confirmed? Yes  Scheduled to see [facility] on 04/16/2021 @ 10:00 am.  Are transportation arrangements needed? No   If their condition worsens, is the pt aware to call PCP or go to the Emergency Dept.? Yes  Was the patient provided with contact information for the PCP's office or ED? Yes  Was to pt encouraged to call back with questions or concerns? Yes

## 2021-04-14 NOTE — Chronic Care Management (AMB) (Signed)
  Chronic Care Management   Note  04/14/2021 Name: Evany Schecter Odden MRN: [id number] DOB: [birth year]  Launi Asencio Hansen is a 65 y.o. year old female who is a primary care patient of Biagio Borg, MD. I reached out to [facility] by phone today in response to a referral sent by Ms. Mliss Sax Luka's PCP, Dr. [name] Reichmann.     Ms. Varkey was given information about Chronic Care Management services today including:  1. CCM service includes personalized support from designated clinical staff supervised by her physician, including individualized plan of care and coordination with other care providers 2. 24/7 contact phone numbers for assistance for urgent and routine care needs. 3. Service will only be billed when office clinical staff spend 20 minutes or more in a month to coordinate care. 4. Only one practitioner may furnish and bill the service in a calendar month. 5. The patient may stop CCM services at any time (effective at the end of the month) by phone call to the office staff. 6. The patient will be responsible for cost sharing (co-pay) of up to 20% of the service fee (after annual deductible is met).  Patient agreed to services and verbal consent obtained.   Follow up plan: Telephone appointment with care management team member scheduled for:04/25/2021  [facility]

## 2021-04-14 NOTE — Progress Notes (Incomplete)
[facility]  PCP: Cathlean Cower Primary Cardiologist: Minus Breeding  HPI:  See Maria [name] is a 65 y.o.  female  with a PMH significant for chronic diastolic CHF, HTN, HLD, B7CW,[id number] pain, super morbid obesity,mild OSAand chronic back pain  Had an echo done in 2016 as part of a workup for fever of unknown origin.  EF 65-70%, G1DD, Collapsible IVC, no valvular disease, no vegetations.    Appears she started having some trouble with peripheral edema in 2019.  Started on lasix at the ED, home health also treating venous insufficiency.  A few months later seen for this issue again in the ED and switched to torsemide.    First admission for respiratory failure was at [facility] hospital from 03/06/21-03/11/21 for acute on chronic diastolic CHF after presenting with weight gain and LE edema. She was diuresed with IV lasix and discharged home on increased po lasix 1m BID. She was subsequently seen in the ED 04/01/21 with complaints of of weight gain of 5lbs overnight and DOE. She was given a dose of IV lasix 475mand discharged home on po lasix [id number] days with increased potassium.  She again presented to the ED 04/06/21 with complaints of leg cramping, SOB, weight gain, abdominal swelling, and LE edema. She was found to have electrolyte imbalance with Na 129 and K 2.8. Cr elevated 1.4 from baseline 1.0. She was felt to be mildly over diuresed and admitted to medicine for further management. She has continued to struggle with hypokalemia requiring aggressive repletion. She was transitioned from lasix to torsemide, Cr improved to 1.1.  Her last echocardiogram 03/08/21 showed EF 60-65%, G1DD, no RWMA, elevated LVEDP, and no significant valvular abnormalities. Does not appear to have had prior ischemic testing though does have evidence of prior coronary artery calcifications (specifically LAD) noted on prior CTA Chest in 2020. Her weight was relatively stable during  her admission at 257lbs.      She is here today for hospital follow up.    ROS: All systems negative except as listed in HPI, PMH and Problem List.  SH:  Social History   Socioeconomic History  . Marital status: Divorced    Spouse name: Not on file  . Number of children: 2  . Years of education: Not on file  . Highest education level: Not on file  Occupational History  . Occupation: disabled  Tobacco Use  . Smoking status: Former Smoker    Years: 30.00    Quit date: 11/08/2008    Years since quitting: 12.4  . Smokeless tobacco: Never Used  . Tobacco comment: quit 10/09  Vaping Use  . Vaping Use: Never used  Substance and Sexual Activity  . Alcohol use: No    Comment: quit drinking 10/07/1997  . Drug use: No  . Sexual activity: Not Currently    Birth control/protection: Surgical, Abstinence  Other Topics Concern  . Not on file  Social History Narrative   Patient has difficulty financially affording food, medication, and affording essential bills. She states she has limited support from her daughter.    Social Determinants of Health   Financial Resource Strain: Medium Risk  . Difficulty of Paying Living Expenses: Somewhat hard  Food Insecurity: Food Insecurity Present  . Worried About RunCharity fundraiser the Last Year: Sometimes true  . Ran Out of Food in the Last Year: Sometimes true  Transportation Needs: No Transportation Needs  . Lack of Transportation (  Medical): No  . Lack of Transportation (Non-Medical): No  Physical Activity: Inactive  . Days of Exercise per Week: 0 days  . Minutes of Exercise per Session: 0 min  Stress: No Stress Concern Present  . Feeling of Stress : Not at all  Social Connections: Unknown  . Frequency of Communication with Friends and Family: More than three times a week  . Frequency of Social Gatherings with Friends and Family: More than three times a week  . Attends Religious Services: Patient refused  . Active Member of Clubs or  Organizations: Patient refused  . Attends Archivist Meetings: Patient refused  . Marital Status: Never married  Intimate Partner Violence: Not At Risk  . Fear of Current or Ex-Partner: No  . Emotionally Abused: No  . Physically Abused: No  . Sexually Abused: No    FH:  Family History  Problem Relation Age of Onset  . Heart disease Mother   . Hypertension Mother   . Diabetes Mother   . Heart failure Mother   . Asthma Sister   . Anxiety disorder Sister   . Depression Sister   . Hypertension Father   . Asthma Daughter   . Bipolar disorder Daughter   . Cancer Maternal Uncle        [place]  . Cancer Other        ovarian  . Liver cancer Paternal Grandmother        ????    Past Medical History:  Diagnosis Date  . Acute lymphadenitis 2011  . ALLERGIC RHINITIS 08/10/2007  . ANXIETY 12/06/2007  . Atherosclerotic peripheral vascular disease ([place]) 06/13/2013   Aorta on CT June 2014  . Cellulitis 2011  . Cervical disc disease 03/09/2012  . CHF (congestive heart failure) ([place])   . Chronic pain 03/09/2012  . DEPRESSION 12/06/2007  . Diabetes ([place])   . DIVERTICULOSIS, [place] 12/06/2007  . GERD 12/06/2007  . Hepatitis    age 9 hepatitis A  . HLD (hyperlipidemia) 05/24/2019  . HYPERTENSION 12/06/2007  . Lumbar disc disease 03/09/2012  . Mesenteric adenitis   . MRSA 2006  . 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 THORACIC/LUMBOSACRAL NEURITIS/RADICULITIS UNSPEC 12/28/2008    Current Outpatient Medications  Medication Sig Dispense Refill  . Accu-Chek Softclix Lancets lancets Use as directed up to 4 times daily 100 each 5  . acetaminophen (TYLENOL) 325 MG tablet Take 650 mg by mouth every 6 (six) hours as needed for headache (pain).    Marland Kitchen albuterol (PROAIR HFA) 108 (90 Base) MCG/ACT inhaler Inhale 2 puffs into the lungs every 6 (six) hours as needed for wheezing or shortness of breath. 1 each 0  . Blood Glucose Monitoring Suppl (ACCU-CHEK GUIDE) w/Device KIT Use as directed  1 kit 0  . clonazePAM (KLONOPIN) 1 MG tablet TAKE 1 TABLET BY MOUTH 2 TIMES A DAY AS NEEDED FOR ANXIETY (Patient taking differently: Take 1 mg by mouth 2 (two) times daily as needed for anxiety.) 60 tablet 5  . cyclobenzaprine (FLEXERIL) 10 MG tablet TAKE 1 TABLET BY MOUTH 2 TIMES A DAY AS NEEDED FOR MUSCLE SPASMS (Patient taking differently: Take 10 mg by mouth 2 (two) times daily as needed for muscle spasms.) 180 tablet 1  . glucose blood (ACCU-CHEK GUIDE) test strip Use as instructed up to 4 times daily 100 each 12  . LINZESS 145 MCG CAPS capsule Take 145 mcg by mouth daily as needed (constipation).    Marland Kitchen loperamide (IMODIUM) 2 MG capsule Take 2  mg by mouth as needed for diarrhea or loose stools.    Marland Kitchen losartan (COZAAR) 100 MG tablet TAKE 1 TABLET BY MOUTH DAILY (Patient taking differently: Take 100 mg by mouth daily.) 90 tablet 1  . magnesium oxide (MAG-OX) 400 MG tablet Take 1 tablet (400 mg total) by mouth daily. 30 tablet 0  . metFORMIN (GLUCOPHAGE) 500 MG tablet Take 1 tablet (500 mg total) by mouth 2 (two) times daily with a meal. 60 tablet 1  . metoprolol tartrate (LOPRESSOR) 25 MG tablet Take 1 tablet (25 mg total) by mouth 2 (two) times daily. 60 tablet 0  . NARCAN 4 MG/0.1ML LIQD nasal spray kit Place 0.4 mg into the nose daily as needed (opioid overdose).    . ondansetron (ZOFRAN ODT) 4 MG disintegrating tablet Take 1 tablet (4 mg total) by mouth every 4 (four) hours as needed for nausea or vomiting. 20 tablet 0  . Oxycodone HCl 10 MG TABS Take 1 tablet (10 mg total) 4 (four) times daily by mouth. Per [facility] (Patient taking differently: Take 10 mg by mouth every 4 (four) hours as needed (pain). Per [facility]) 30 tablet 0  . pantoprazole (PROTONIX) 40 MG tablet TAKE ONE TABLET BY MOUTH DAILY (Patient taking differently: Take 40 mg by mouth daily.) 90 tablet 2  . polyethylene glycol powder (GLYCOLAX/MIRALAX) 17 GM/SCOOP powder Take 17 g by mouth daily as needed for  mild constipation. 510 g 0  . potassium chloride SA (KLOR-CON) 20 MEQ tablet Take 1 tablet (20 mEq total) by mouth 2 (two) times daily. 60 tablet 0  . rOPINIRole (REQUIP) 3 MG tablet TAKE 1 TABLET BY MOUTH NIGHTLY AT BEDTIME AS NEEDED (Patient taking differently: Take 3 mg by mouth at bedtime as needed (RLS).) 30 tablet 5  . Simethicone (GAS-X EXTRA STRENGTH PO) Take 1 tablet by mouth daily as needed (gas).    . Tetrahydrozoline HCl (VISINE OP) Place 1 drop into both eyes daily as needed (dry eyes).    . torsemide (DEMADEX) 20 MG tablet Take 2 tablets (40 mg total) by mouth 2 (two) times daily. 120 tablet 0   No current facility-administered medications for this visit.    There were no vitals filed for this visit.  PHYSICAL EXAM: ***   ECG   ASSESSMENT & PLAN:  NYHA Class  Chronic diastolic CHF:  - - Will add metoprolol tartrate 75m BID - Continue losartan - Continue to monitor strict I&Os and daily weights - Continue to monitor electrolytes and replete as needed to maintain K >4, Mg >2.  CAD:  - - Consider outpatient ischemic evaluation.   HTN:  - Continue metoprolol and losartan as above  T2DM:  - A1C 6.5 03/2021 - Could consider SGLT-2 inhibitor  - Continue management per primary team  Suspected OSA:  -reports sleep study 1 year ago did not show OSA - do not see record of this, however study in 2019 suggested mild OSA. Possible this has progressed.  - May benefit from a repeat sleep study   Morbid obesity: likely contributing to her DOE.  - Continue to encourage dietary/lifestyle modifications to promote weight loss   Refer to Social Work:  PCP  Medications  Transportation   ETOH   Drug Abuse ITax inspectorto Pharmacy:  Refer to FDevelopment worker, community:  Refer to Home Health:   [facility] to [facility]  Refer to General Cardiology Other    Follow  up

## 2021-04-16 ENCOUNTER — Encounter (HOSPITAL_COMMUNITY): Payer: Medicare Other

## 2021-04-17 ENCOUNTER — Other Ambulatory Visit: Payer: Self-pay

## 2021-04-17 DIAGNOSIS — I5032 Chronic diastolic (congestive) heart failure: Secondary | ICD-10-CM | POA: Diagnosis not present

## 2021-04-17 DIAGNOSIS — I110 Hypertensive heart disease with heart failure: Secondary | ICD-10-CM | POA: Diagnosis not present

## 2021-04-17 DIAGNOSIS — Z7984 Long term (current) use of oral hypoglycemic drugs: Secondary | ICD-10-CM | POA: Diagnosis not present

## 2021-04-17 DIAGNOSIS — E876 Hypokalemia: Secondary | ICD-10-CM | POA: Diagnosis not present

## 2021-04-17 DIAGNOSIS — M545 Low back pain, unspecified: Secondary | ICD-10-CM | POA: Diagnosis not present

## 2021-04-17 DIAGNOSIS — Z9181 History of falling: Secondary | ICD-10-CM | POA: Diagnosis not present

## 2021-04-17 DIAGNOSIS — E1151 Type 2 diabetes mellitus with diabetic peripheral angiopathy without gangrene: Secondary | ICD-10-CM | POA: Diagnosis not present

## 2021-04-17 DIAGNOSIS — N179 Acute kidney failure, unspecified: Secondary | ICD-10-CM | POA: Diagnosis not present

## 2021-04-17 DIAGNOSIS — I872 Venous insufficiency (chronic) (peripheral): Secondary | ICD-10-CM | POA: Diagnosis not present

## 2021-04-17 DIAGNOSIS — J441 Chronic obstructive pulmonary disease with (acute) exacerbation: Secondary | ICD-10-CM | POA: Diagnosis not present

## 2021-04-17 DIAGNOSIS — E785 Hyperlipidemia, unspecified: Secondary | ICD-10-CM | POA: Diagnosis not present

## 2021-04-17 DIAGNOSIS — I700 Atherosclerosis of aorta: Secondary | ICD-10-CM | POA: Diagnosis not present

## 2021-04-17 DIAGNOSIS — K5900 Constipation, unspecified: Secondary | ICD-10-CM | POA: Diagnosis not present

## 2021-04-17 DIAGNOSIS — M19011 Primary osteoarthritis, right shoulder: Secondary | ICD-10-CM | POA: Diagnosis not present

## 2021-04-17 DIAGNOSIS — G8929 Other chronic pain: Secondary | ICD-10-CM | POA: Diagnosis not present

## 2021-04-18 ENCOUNTER — Telehealth: Payer: Self-pay | Admitting: Internal Medicine

## 2021-04-18 ENCOUNTER — Encounter: Payer: Self-pay | Admitting: Internal Medicine

## 2021-04-18 ENCOUNTER — Ambulatory Visit (INDEPENDENT_AMBULATORY_CARE_PROVIDER_SITE_OTHER): Payer: Medicare Other | Admitting: Internal Medicine

## 2021-04-18 VITALS — BP 138/76 | HR 76 | Temp 98.1°F | Ht 65.0 in | Wt 356.0 lb

## 2021-04-18 DIAGNOSIS — I5032 Chronic diastolic (congestive) heart failure: Secondary | ICD-10-CM

## 2021-04-18 DIAGNOSIS — I700 Atherosclerosis of aorta: Secondary | ICD-10-CM

## 2021-04-18 DIAGNOSIS — R739 Hyperglycemia, unspecified: Secondary | ICD-10-CM | POA: Diagnosis not present

## 2021-04-18 DIAGNOSIS — I890 Lymphedema, not elsewhere classified: Secondary | ICD-10-CM

## 2021-04-18 LAB — CBC WITH DIFFERENTIAL/PLATELET
Basophils Absolute: 0.1 10*3/uL (ref 0.0–0.1)
Basophils Relative: 0.6 % (ref 0.0–3.0)
Eosinophils Absolute: 0.1 10*3/uL (ref 0.0–0.7)
Eosinophils Relative: 1.4 % (ref 0.0–5.0)
HCT: 38 % (ref 36.0–46.0)
Hemoglobin: 12.9 g/dL (ref 12.0–15.0)
Lymphocytes Relative: 31.2 % (ref 12.0–46.0)
Lymphs Abs: 2.6 10*3/uL (ref 0.7–4.0)
MCHC: 34 g/dL (ref 30.0–36.0)
MCV: 89.2 fl (ref 78.0–100.0)
Monocytes Absolute: 0.6 10*3/uL (ref 0.1–1.0)
Monocytes Relative: 6.8 % (ref 3.0–12.0)
Neutro Abs: 5 10*3/uL (ref 1.4–7.7)
Neutrophils Relative %: 60 % (ref 43.0–77.0)
Platelets: 290 10*3/uL (ref 150.0–400.0)
RBC: 4.26 Mil/uL (ref 3.87–5.11)
RDW: 14.2 % (ref 11.5–15.5)
WBC: 8.3 10*3/uL (ref 4.0–10.5)

## 2021-04-18 LAB — BASIC METABOLIC PANEL
BUN: 16 mg/dL (ref 6–23)
CO2: 29 mEq/L (ref 19–32)
Calcium: 9.7 mg/dL (ref 8.4–10.5)
Chloride: 97 mEq/L (ref 96–112)
Creatinine, Ser: 1.15 mg/dL (ref 0.40–1.20)
GFR: 50.54 mL/min — ABNORMAL LOW (ref >60.00)
Glucose, Bld: 94 mg/dL (ref 70–99)
Potassium: 3.8 mEq/L (ref 3.5–5.1)
Sodium: 136 mEq/L (ref 135–145)

## 2021-04-18 LAB — HEPATIC FUNCTION PANEL
ALT: 22 U/L (ref 0–35)
AST: 16 U/L (ref 0–37)
Albumin: 4.3 g/dL (ref 3.5–5.2)
Alkaline Phosphatase: 72 U/L (ref 39–117)
Bilirubin, Direct: 0.1 mg/dL (ref 0.0–0.3)
Total Bilirubin: 0.9 mg/dL (ref 0.2–1.2)
Total Protein: 7.7 g/dL (ref 6.0–8.3)

## 2021-04-18 NOTE — Patient Instructions (Signed)
Please continue all other medications as before, and refills have been done if requested.  Please have the pharmacy call with any other refills you may need.  Please continue your efforts at being more active, low cholesterol diet, and weight control.  Please keep your appointments with your specialists as you may have planned  Please go to the LAB at the blood drawing area for the tests to be done  You will be contacted by phone if any changes need to be made immediately.  Otherwise, you will receive a letter about your results with an explanation, but please check with MyChart first.  Please remember to sign up for MyChart if you have not done so, as this will be important to you in the future with finding out test results, communicating by private email, and scheduling acute appointments online when needed. 

## 2021-04-18 NOTE — Telephone Encounter (Signed)
Christine a physical therapist with [facility] calling, she had an evaluation with the patient yesterday and she would like verbal orders for home health physical therapy for 2 week 2 and 1 week 2. Altha Harm- [phone] Okay to leave voicemail

## 2021-04-18 NOTE — Telephone Encounter (Signed)
Ok for verbals 

## 2021-04-18 NOTE — Progress Notes (Signed)
Patient ID: Maria [name], female   DOB: 06-26-1956, 65 y.o.   MRN: [id number]        Chief Complaint: follow up recent hospn with diast chf and lymphedema       HPI:  Maria [name] is a 65 y.o. female here with c/o post hops visit after apri 17-[facility] with AKI after lasix - was changed to torsemide, and wt stable for 1 wk; accidently only taking 20 mg bid instead of 40 mg bid; but wt is no change; pt c/o worsening leg cramps bilat this am; started PT and no falling at home.  Here with walker, but has cane at home due to rollater too large for the home hallways.  Has constipation with hospital dilaudid and BMs now improved, now improved, actually has some loose stools at home just after left hospital now better.  Had some worsening k several times.  Asked to take otc magnesium but has not yet started - plans to start this and MVI.  Pt denies chest pain, increased sob or doe, wheezing, orthopnea, PND, palpitations, dizziness or syncope.  Pt denies polydipsia, polyuria, or new focal neuro s/s.    Wt Readings from Last 3 Encounters:  04/18/21 (!) 356 lb (161.5 kg)  04/11/21 (!) 357 lb 14.4 oz (162.3 kg)  04/01/21 (!) 362 lb (164.2 kg)   BP Readings from Last 3 Encounters:  04/18/21 138/76  04/11/21 (!) 125/50  04/01/21 (!) 132/46         Past Medical History:  Diagnosis Date  . Acute lymphadenitis 2011  . ALLERGIC RHINITIS 08/10/2007  . ANXIETY 12/06/2007  . Atherosclerotic peripheral vascular disease ([place]) 06/13/2013   Aorta on CT June 2014  . Cellulitis 2011  . Cervical disc disease 03/09/2012  . CHF (congestive heart failure) ([place])   . Chronic pain 03/09/2012  . DEPRESSION 12/06/2007  . Diabetes ([place])   . DIVERTICULOSIS, [place] 12/06/2007  . GERD 12/06/2007  . Hepatitis    age 56 hepatitis A  . HLD (hyperlipidemia) 05/24/2019  . HYPERTENSION 12/06/2007  . Lumbar disc disease 03/09/2012  . Mesenteric adenitis   . MRSA 2006  . 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  THORACIC/LUMBOSACRAL NEURITIS/RADICULITIS UNSPEC 12/28/2008   Past Surgical History:  Procedure Laterality Date  . ABDOMINAL HYSTERECTOMY  1999   1 ovary left  . bloo clot removed from neck   may 25th , june 2. 2010  . [place] SURGERY  may 24th 2010  . COLONOSCOPY WITH PROPOFOL N/A 07/10/2016   Procedure: COLONOSCOPY WITH PROPOFOL;  Surgeon: Manus Gunning, MD;  Location: [facility];  Service: Gastroenterology;  Laterality: N/A;  . COLONOSCOPY WITH PROPOFOL N/A 09/26/2019   Procedure: COLONOSCOPY WITH PROPOFOL;  Surgeon: Yetta Flock, MD;  Location: [facility];  Service: Gastroenterology;  Laterality: N/A;  . ESOPHAGOGASTRODUODENOSCOPY (EGD) WITH PROPOFOL N/A 07/10/2016   Procedure: ESOPHAGOGASTRODUODENOSCOPY (EGD) WITH PROPOFOL;  Surgeon: Manus Gunning, MD;  Location: [facility];  Service: Gastroenterology;  Laterality: N/A;  . POLYPECTOMY  09/26/2019   Procedure: POLYPECTOMY;  Surgeon: Yetta Flock, MD;  Location: [facility];  Service: Gastroenterology;;  . s/p ovary cyst    . s/p right knee arthroscopy     Dr. Mardelle Matte ortho    reports that she quit smoking about 12 years ago. She quit after 30.00 years of use. She has never used smokeless tobacco. She reports that she does not drink alcohol and does not use drugs. family history includes  Anxiety disorder in her sister; Asthma in her daughter and sister; Bipolar disorder in her daughter; Cancer in her maternal uncle and another family member; Depression in her sister; Diabetes in her mother; Heart disease in her mother; Heart failure in her mother; Hypertension in her father and mother; Liver cancer in her paternal grandmother. Allergies  Allergen Reactions  . Amoxicillin-Pot Clavulanate Nausea And Vomiting    Projectile vomiting Did it involve swelling of the face/tongue/throat, SOB, or low BP? No Did it involve sudden or severe rash/hives, skin peeling, or any reaction on the inside of your mouth  or nose? No Did you need to seek medical attention at a hospital or doctor's office? No When did it last happen?20-30 years If all above answers are "NO", may proceed with cephalosporin use.   . Adhesive [Tape] Other (See Comments)    Tears skin off - use paper tape   . Codeine Other (See Comments)    hallucinations  . Crestor [Rosuvastatin Calcium]     Severe muscle cramps  . Lactose Intolerance (Gi) Nausea And Vomiting  . Morphine And Related Nausea And Vomiting and Other (See Comments)    Migraine headaches  . Vicodin [Hydrocodone-Acetaminophen] Other (See Comments)    hallucinations  . Latex Rash   Current Outpatient Medications on File Prior to Visit  Medication Sig Dispense Refill  . Accu-Chek Softclix Lancets lancets Use as directed up to 4 times daily (Patient taking differently: Use as directed up to 4 times daily) 100 each 5  . acetaminophen (TYLENOL) 325 MG tablet Take 650 mg by mouth every 6 (six) hours as needed for headache (pain).    Marland Kitchen albuterol (PROAIR HFA) 108 (90 Base) MCG/ACT inhaler Inhale 2 puffs into the lungs every 6 (six) hours as needed for wheezing or shortness of breath. 1 each 0  . Blood Glucose Monitoring Suppl (ACCU-CHEK GUIDE) w/Device KIT Use as directed 1 kit 0  . clonazePAM (KLONOPIN) 1 MG tablet TAKE 1 TABLET BY MOUTH 2 TIMES A DAY AS NEEDED FOR ANXIETY (Patient taking differently: Take 1 mg by mouth 2 (two) times daily as needed for anxiety.) 60 tablet 5  . cyclobenzaprine (FLEXERIL) 10 MG tablet TAKE 1 TABLET BY MOUTH 2 TIMES A DAY AS NEEDED FOR MUSCLE SPASMS (Patient taking differently: Take 10 mg by mouth 2 (two) times daily as needed for muscle spasms.) 180 tablet 1  . glucose blood (ACCU-CHEK GUIDE) test strip Use as instructed up to 4 times daily (Patient taking differently: Use as instructed up to 4 times daily) 100 each 12  . LINZESS 145 MCG CAPS capsule Take 145 mcg by mouth daily as needed (constipation).    Marland Kitchen loperamide (IMODIUM) 2 MG  capsule Take 2 mg by mouth as needed for diarrhea or loose stools.    Marland Kitchen losartan (COZAAR) 100 MG tablet TAKE 1 TABLET BY MOUTH DAILY (Patient taking differently: Take 100 mg by mouth daily.) 90 tablet 1  . magnesium oxide (MAG-OX) 400 MG tablet Take 1 tablet (400 mg total) by mouth daily. 30 tablet 0  . metFORMIN (GLUCOPHAGE) 500 MG tablet Take 1 tablet (500 mg total) by mouth 2 (two) times daily with a meal. 60 tablet 1  . metoprolol tartrate (LOPRESSOR) 25 MG tablet Take 1 tablet (25 mg total) by mouth 2 (two) times daily. 60 tablet 0  . NARCAN 4 MG/0.1ML LIQD nasal spray kit Place 0.4 mg into the nose daily as needed (opioid overdose).    . ondansetron (ZOFRAN ODT)  4 MG disintegrating tablet Take 1 tablet (4 mg total) by mouth every 4 (four) hours as needed for nausea or vomiting. 20 tablet 0  . Oxycodone HCl 10 MG TABS Take 1 tablet (10 mg total) 4 (four) times daily by mouth. Per [facility] (Patient taking differently: Take 10 mg by mouth every 4 (four) hours as needed (pain). Per [facility]) 30 tablet 0  . pantoprazole (PROTONIX) 40 MG tablet TAKE ONE TABLET BY MOUTH DAILY (Patient taking differently: Take 40 mg by mouth daily.) 90 tablet 2  . polyethylene glycol powder (GLYCOLAX/MIRALAX) 17 GM/SCOOP powder Take 17 g by mouth daily as needed for mild constipation. 510 g 0  . potassium chloride SA (KLOR-CON) 20 MEQ tablet Take 1 tablet (20 mEq total) by mouth 2 (two) times daily. 60 tablet 0  . rOPINIRole (REQUIP) 3 MG tablet TAKE 1 TABLET BY MOUTH NIGHTLY AT BEDTIME AS NEEDED (Patient taking differently: Take 3 mg by mouth at bedtime as needed (RLS).) 30 tablet 5  . Simethicone (GAS-X EXTRA STRENGTH PO) Take 1 tablet by mouth daily as needed (gas).    . Tetrahydrozoline HCl (VISINE OP) Place 1 drop into both eyes daily as needed (dry eyes).    . torsemide (DEMADEX) 20 MG tablet Take 2 tablets (40 mg total) by mouth 2 (two) times daily. 120 tablet 0  . [DISCONTINUED]  DULoxetine (CYMBALTA) 60 MG capsule Take 60 mg by mouth daily.      . [DISCONTINUED] fexofenadine (ALLEGRA) 180 MG tablet Take 180 mg by mouth daily.       No current facility-administered medications on file prior to visit.        ROS:  All others reviewed and negative.  Objective        PE:  BP 138/76 (BP Location: Right Arm, Patient Position: Sitting, Cuff Size: Large)   Pulse 76   Temp 98.1 F (36.7 C) (Oral)   Ht '5\' 5"'  (1.651 m)   Wt (!) 356 lb (161.5 kg)   SpO2 95%   BMI 59.24 kg/m                 Constitutional: Pt appears in NAD               HENT: Head: NCAT.                Right Ear: External ear normal.                 Left Ear: External ear normal.                Eyes: . Pupils are equal, round, and reactive to light. Conjunctivae and EOM are normal               Nose: without d/c or deformity               Neck: Neck supple. Gross normal ROM               Cardiovascular: Normal rate and regular rhythm.                 Pulmonary/Chest: Effort normal and breath sounds without rales or wheezing.                Abd:  Soft, NT, ND, + BS, no organomegaly               Neurological: Pt is alert. At baseline orientation, motor grossly intact  Skin: Skin is warm. No rashes, no other new lesions, LE edema - chronic 1-2+ lymphedema bilat               Psychiatric: Pt behavior is normal without agitation   Micro: none  Cardiac tracings I have personally interpreted today:  none  Pertinent Radiological findings (summarize): none   Lab Results  Component Value Date   WBC 8.3 04/18/2021   HGB 12.9 04/18/2021   HCT 38.0 04/18/2021   PLT 290.0 04/18/2021   GLUCOSE 94 04/18/2021   CHOL 156 12/31/2020   TRIG 133.0 12/31/2020   HDL 46.90 12/31/2020   LDLDIRECT 127.0 01/25/2019   LDLCALC 83 12/31/2020   ALT 22 04/18/2021   AST 16 04/18/2021   NA 136 04/18/2021   K 3.8 04/18/2021   CL 97 04/18/2021   CREATININE 1.15 04/18/2021   BUN 16 04/18/2021   CO2 29  04/18/2021   TSH 1.45 12/31/2020   INR 1.0 04/01/2021   HGBA1C 6.5 (H) 03/07/2021   Assessment/Plan:  Maria [name] is a 65 y.o. White or Caucasian [1] female with  has a past medical history of Acute lymphadenitis (2011), ALLERGIC RHINITIS (08/10/2007), ANXIETY (12/06/2007), Atherosclerotic peripheral vascular disease ([place]) (06/13/2013), Cellulitis (2011), Cervical disc disease (03/09/2012), CHF (congestive heart failure) ([place]), Chronic pain (03/09/2012), DEPRESSION (12/06/2007), Diabetes ([place]), DIVERTICULOSIS, [place] (12/06/2007), GERD (12/06/2007), Hepatitis, HLD (hyperlipidemia) (05/24/2019), HYPERTENSION (12/06/2007), Lumbar disc disease (03/09/2012), Mesenteric adenitis, MRSA (2006), 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[place]) (11/08/2017), and THORACIC/LUMBOSACRAL NEURITIS/RADICULITIS UNSPEC (12/28/2008).  Diastolic congestive heart failure (HCC) Stable overall, to continue the torsemide 20 bid  Aortic atherosclerosis ([place]) To continue diet, exercise, and declines statin for now  Acquired lymphedema Stable, no worsening or cellulitis,  to f/u any worsening symptoms or concerns  Hyperglycemia Lab Results  Component Value Date   HGBA1C 6.5 (H) 03/07/2021   Stable, pt to continue current medical treatment - metformin   Followup: Return in about 3 months (around 07/18/2021).  Cathlean Cower, MD 04/19/2021 2:09 AM [facility]

## 2021-04-19 ENCOUNTER — Encounter: Payer: Self-pay | Admitting: Internal Medicine

## 2021-04-19 NOTE — Assessment & Plan Note (Signed)
Stable overall, to continue the torsemide 20 bid

## 2021-04-19 NOTE — Assessment & Plan Note (Signed)
Lab Results  Component Value Date   HGBA1C 6.5 (H) 03/07/2021   Stable, pt to continue current medical treatment - metformin

## 2021-04-19 NOTE — Assessment & Plan Note (Signed)
To continue diet, exercise, and declines statin for now

## 2021-04-19 NOTE — Assessment & Plan Note (Signed)
Stable, no worsening or cellulitis,  to f/u any worsening symptoms or concerns

## 2021-04-21 DIAGNOSIS — I110 Hypertensive heart disease with heart failure: Secondary | ICD-10-CM | POA: Diagnosis not present

## 2021-04-21 DIAGNOSIS — I5032 Chronic diastolic (congestive) heart failure: Secondary | ICD-10-CM | POA: Diagnosis not present

## 2021-04-21 DIAGNOSIS — Z9181 History of falling: Secondary | ICD-10-CM | POA: Diagnosis not present

## 2021-04-21 DIAGNOSIS — I700 Atherosclerosis of aorta: Secondary | ICD-10-CM | POA: Diagnosis not present

## 2021-04-21 DIAGNOSIS — K5900 Constipation, unspecified: Secondary | ICD-10-CM | POA: Diagnosis not present

## 2021-04-21 DIAGNOSIS — E785 Hyperlipidemia, unspecified: Secondary | ICD-10-CM | POA: Diagnosis not present

## 2021-04-21 DIAGNOSIS — I872 Venous insufficiency (chronic) (peripheral): Secondary | ICD-10-CM | POA: Diagnosis not present

## 2021-04-21 DIAGNOSIS — M545 Low back pain, unspecified: Secondary | ICD-10-CM | POA: Diagnosis not present

## 2021-04-21 DIAGNOSIS — M19011 Primary osteoarthritis, right shoulder: Secondary | ICD-10-CM | POA: Diagnosis not present

## 2021-04-21 DIAGNOSIS — E1151 Type 2 diabetes mellitus with diabetic peripheral angiopathy without gangrene: Secondary | ICD-10-CM | POA: Diagnosis not present

## 2021-04-21 DIAGNOSIS — E876 Hypokalemia: Secondary | ICD-10-CM | POA: Diagnosis not present

## 2021-04-21 DIAGNOSIS — G8929 Other chronic pain: Secondary | ICD-10-CM | POA: Diagnosis not present

## 2021-04-21 DIAGNOSIS — N179 Acute kidney failure, unspecified: Secondary | ICD-10-CM | POA: Diagnosis not present

## 2021-04-21 DIAGNOSIS — J441 Chronic obstructive pulmonary disease with (acute) exacerbation: Secondary | ICD-10-CM | POA: Diagnosis not present

## 2021-04-21 DIAGNOSIS — Z7984 Long term (current) use of oral hypoglycemic drugs: Secondary | ICD-10-CM | POA: Diagnosis not present

## 2021-04-21 NOTE — Telephone Encounter (Signed)
Verbal orders left of [employer].

## 2021-04-22 ENCOUNTER — Telehealth: Payer: Self-pay | Admitting: Internal Medicine

## 2021-04-22 DIAGNOSIS — K5900 Constipation, unspecified: Secondary | ICD-10-CM | POA: Diagnosis not present

## 2021-04-22 DIAGNOSIS — I5032 Chronic diastolic (congestive) heart failure: Secondary | ICD-10-CM | POA: Diagnosis not present

## 2021-04-22 DIAGNOSIS — I110 Hypertensive heart disease with heart failure: Secondary | ICD-10-CM | POA: Diagnosis not present

## 2021-04-22 DIAGNOSIS — I872 Venous insufficiency (chronic) (peripheral): Secondary | ICD-10-CM | POA: Diagnosis not present

## 2021-04-22 DIAGNOSIS — M545 Low back pain, unspecified: Secondary | ICD-10-CM | POA: Diagnosis not present

## 2021-04-22 DIAGNOSIS — Z9181 History of falling: Secondary | ICD-10-CM | POA: Diagnosis not present

## 2021-04-22 DIAGNOSIS — N179 Acute kidney failure, unspecified: Secondary | ICD-10-CM | POA: Diagnosis not present

## 2021-04-22 DIAGNOSIS — J441 Chronic obstructive pulmonary disease with (acute) exacerbation: Secondary | ICD-10-CM | POA: Diagnosis not present

## 2021-04-22 DIAGNOSIS — I700 Atherosclerosis of aorta: Secondary | ICD-10-CM | POA: Diagnosis not present

## 2021-04-22 DIAGNOSIS — E785 Hyperlipidemia, unspecified: Secondary | ICD-10-CM | POA: Diagnosis not present

## 2021-04-22 DIAGNOSIS — Z7984 Long term (current) use of oral hypoglycemic drugs: Secondary | ICD-10-CM | POA: Diagnosis not present

## 2021-04-22 DIAGNOSIS — E876 Hypokalemia: Secondary | ICD-10-CM | POA: Diagnosis not present

## 2021-04-22 DIAGNOSIS — M19011 Primary osteoarthritis, right shoulder: Secondary | ICD-10-CM | POA: Diagnosis not present

## 2021-04-22 DIAGNOSIS — E1151 Type 2 diabetes mellitus with diabetic peripheral angiopathy without gangrene: Secondary | ICD-10-CM | POA: Diagnosis not present

## 2021-04-22 DIAGNOSIS — G894 Chronic pain syndrome: Secondary | ICD-10-CM | POA: Diagnosis not present

## 2021-04-22 DIAGNOSIS — G8929 Other chronic pain: Secondary | ICD-10-CM | POA: Diagnosis not present

## 2021-04-22 NOTE — Telephone Encounter (Signed)
   [place] Name: [place] Agency Name: Santina Evans Phone #: [phone] Service Requested: OT Frequency of Visits: 2W1, [phone]

## 2021-04-23 NOTE — Telephone Encounter (Signed)
Ok for verbals 

## 2021-04-24 ENCOUNTER — Telehealth: Payer: Self-pay | Admitting: Cardiology

## 2021-04-24 NOTE — Telephone Encounter (Signed)
Discussed with Blima Ledger NP and will have patient take an extra dose of Torsemide tomorrow, continue to monitor, call back if no improvement  Victoriano Lain and she will notify patient

## 2021-04-24 NOTE — Telephone Encounter (Signed)
Received call from [place] with [facility] Stated patient has had a 3 pound weight gain over night No change in breathing, does have bilateral lower extremity swelling  Will forward to Dr Percival Spanish for review

## 2021-04-24 NOTE — Telephone Encounter (Signed)
Agree 

## 2021-04-25 ENCOUNTER — Ambulatory Visit (INDEPENDENT_AMBULATORY_CARE_PROVIDER_SITE_OTHER): Payer: Medicare Other | Admitting: *Deleted

## 2021-04-25 DIAGNOSIS — I5030 Unspecified diastolic (congestive) heart failure: Secondary | ICD-10-CM

## 2021-04-25 DIAGNOSIS — I5032 Chronic diastolic (congestive) heart failure: Secondary | ICD-10-CM

## 2021-04-25 DIAGNOSIS — I110 Hypertensive heart disease with heart failure: Secondary | ICD-10-CM | POA: Diagnosis not present

## 2021-04-25 DIAGNOSIS — N179 Acute kidney failure, unspecified: Secondary | ICD-10-CM | POA: Diagnosis not present

## 2021-04-25 DIAGNOSIS — I872 Venous insufficiency (chronic) (peripheral): Secondary | ICD-10-CM | POA: Diagnosis not present

## 2021-04-25 DIAGNOSIS — E876 Hypokalemia: Secondary | ICD-10-CM | POA: Diagnosis not present

## 2021-04-25 DIAGNOSIS — E1151 Type 2 diabetes mellitus with diabetic peripheral angiopathy without gangrene: Secondary | ICD-10-CM | POA: Diagnosis not present

## 2021-04-25 DIAGNOSIS — E785 Hyperlipidemia, unspecified: Secondary | ICD-10-CM | POA: Diagnosis not present

## 2021-04-25 DIAGNOSIS — Z7984 Long term (current) use of oral hypoglycemic drugs: Secondary | ICD-10-CM | POA: Diagnosis not present

## 2021-04-25 DIAGNOSIS — E1169 Type 2 diabetes mellitus with other specified complication: Secondary | ICD-10-CM

## 2021-04-25 DIAGNOSIS — K5900 Constipation, unspecified: Secondary | ICD-10-CM | POA: Diagnosis not present

## 2021-04-25 DIAGNOSIS — I700 Atherosclerosis of aorta: Secondary | ICD-10-CM | POA: Diagnosis not present

## 2021-04-25 DIAGNOSIS — G8929 Other chronic pain: Secondary | ICD-10-CM | POA: Diagnosis not present

## 2021-04-25 DIAGNOSIS — Z9181 History of falling: Secondary | ICD-10-CM | POA: Diagnosis not present

## 2021-04-25 DIAGNOSIS — M545 Low back pain, unspecified: Secondary | ICD-10-CM | POA: Diagnosis not present

## 2021-04-25 DIAGNOSIS — J441 Chronic obstructive pulmonary disease with (acute) exacerbation: Secondary | ICD-10-CM | POA: Diagnosis not present

## 2021-04-25 DIAGNOSIS — M19011 Primary osteoarthritis, right shoulder: Secondary | ICD-10-CM | POA: Diagnosis not present

## 2021-04-25 NOTE — Patient Instructions (Signed)
Visit [place], it was nice talking with you today.   Please read over the attached information, we will discuss during our future telephone visits   I look forward to talking to you again for an update on Wednesday May 11 at 1:00 pm- please be listening out for my call that day.  I will call as close to 1:00 pm as possible; I look forward to hearing about your progress.   Please don't hesitate to contact me if I can be of assistance to you before our next scheduled appointment.   [name], RN, BSN, [facility] RN Care Coordination- [place] [phone]: direct office [phone]: mobile    PATIENT GOALS:  Goals Addressed            This Visit's Progress   . Monitor and Manage My Blood Sugar-Diabetes Type 2   On track    Timeframe:  Long-Range Goal Priority:  Medium Start Date:     04/25/21                        Expected End Date:   10/26/21                    Follow Up Date 04/30/21   . Monitor and record your blood sugar at prescribed times: write down your blood sugars at home first thing in the morning before you eat (fasting) and 2 hours after eating a regular meal . Re- check blood sugar if I feel it is too high or too low . Enter/ record blood sugar readings into daily log- write down your blood sugars on paper in a dedicated place where you can review them easily- we will review your blood sugars each time we talk . Take the blood sugar log to all doctor visits- these values will help your doctor make sure you are on the right medication   Why is this important?    Checking your blood sugar at home helps to keep it from getting very high or very low.   Writing the results in a diary or log helps the doctor know how to care for you.   Your blood sugar log should have the time, date and the results.   Also, write down the amount of insulin or other medicine that you take.   Other information, like what you ate,  exercise done and how you were feeling, will also be helpful.         . Track and Manage Fluids and Swelling-Heart Failure   On track    Timeframe:  Long-Range Goal Priority:  High Start Date:  04/25/21                           Expected End Date:      10/26/21                 Follow Up Date 04/30/21   . Weigh myself daily at home: call cardiology office if I gain more than 2 pounds in one day or 5 pounds in one week . Track weight in diary or on a calendar: it is VERY important to write your daily weights down on paper . Watch for swelling in feet, ankles and legs every day: if you have an increased amount of swelling, make a note of it and let your cardiology provider know .  Keep legs up while sitting . Use salt in moderation  Why is this important?    It is important to check your weight daily and watch how much salt and liquids you have.   It will help you to manage your heart failure.           Critical care medicine: Principles of diagnosis and management in the adult (4th ed., pp. 5465-6812). Saunders."> Miller's anesthesia (8th ed., pp. 232-250). Saunders.">  Advance Directive  Advance directives are legal documents that allow you to make decisions about your health care and medical treatment in case you become unable to communicate for yourself. Advance directives let your wishes be known to family, friends, and health care providers. Discussing and writing advance directives should happen over time rather than all at once. Advance directives can be changed and updated at any time. There are different types of advance directives, such as:  Medical power of attorney.  Living will.  Do not resuscitate (DNR) order or do not attempt resuscitation (DNAR) order. Health care proxy and medical power of attorney A health care proxy is also called a health care agent. This person is appointed to make medical decisions for you when you are unable to make decisions for yourself.  Generally, people ask a trusted friend or family member to act as their proxy and represent their preferences. Make sure you have an agreement with your trusted person to act as your proxy. A proxy may have to make a medical decision on your behalf if your wishes are not known. A medical power of attorney, also called a durable power of attorney for health care, is a legal document that names your health care proxy. Depending on the laws in your state, the document may need to be:  Signed.  Notarized.  Dated.  Copied.  Witnessed.  Incorporated into your medical record. You may also want to appoint a trusted person to manage your money in the event you are unable to do so. This is called a durable power of attorney for finances. It is a separate legal document from the durable power of attorney for health care. You may choose your health care proxy or someone different to act as your agent in money matters. If you do not appoint a proxy, or there is a concern that the proxy is not acting in your best interest, a court may appoint a guardian to act on your behalf. Living will A living will is a set of instructions that state your wishes about medical care when you cannot express them yourself. Health care providers should keep a copy of your living will in your medical record. You may want to give a copy to family members or friends. To alert caregivers in case of an emergency, you can place a card in your wallet to let them know that you have a living will and where they can find it. A living will is used if you become:  Terminally ill.  Disabled.  Unable to communicate or make decisions. The following decisions should be included in your living will:  To use or not to use life support equipment, such as dialysis machines and breathing machines (ventilators).  Whether you want a DNR or DNAR order. This tells health care providers not to use cardiopulmonary resuscitation (CPR) if breathing  or heartbeat stops.  To use or not to use tube feeding.  To be given or not to be given food and fluids.  Whether you  want comfort (palliative) care when the goal becomes comfort rather than a cure.  Whether you want to donate your organs and tissues. A living will does not give instructions for distributing your money and property if you should pass away. DNR or DNAR A DNR or DNAR order is a request not to have CPR in the event that your heart stops beating or you stop breathing. If a DNR or DNAR order has not been made and shared, a health care provider will try to help any patient whose heart has stopped or who has stopped breathing. If you plan to have surgery, talk with your health care provider about how your DNR or DNAR order will be followed if problems occur. What if I do not have an advance directive? Some states assign family decision makers to act on your behalf if you do not have an advance directive. Each state has its own laws about advance directives. You may want to check with your health care provider, attorney, or state representative about the laws in your state. Summary  Advance directives are legal documents that allow you to make decisions about your health care and medical treatment in case you become unable to communicate for yourself.  The process of discussing and writing advance directives should happen over time. You can change and update advance directives at any time.  Advance directives may include a medical power of attorney, a living will, and a DNR or DNAR order. This information is not intended to replace advice given to you by your health care provider. Make sure you discuss any questions you have with your health care provider. Document Revised: 09/10/2020 Document Reviewed: 09/10/2020 Elsevier Patient Education  2021 [employer].   Blood Glucose Monitoring, Adult Monitoring your blood sugar (glucose) is an important part of managing your diabetes.  Blood glucose monitoring involves checking your blood glucose as often as directed and keeping a log or record of your results over time. Checking your blood glucose regularly and keeping a blood glucose log can:  Help you and your health care provider adjust your diabetes management plan as needed, including your medicines or insulin.  Help you understand how food, exercise, illnesses, and medicines affect your blood glucose.  Let you know what your blood glucose is at any time. You can quickly find out if you have low blood glucose (hypoglycemia) or high blood glucose (hyperglycemia). Your health care provider will set individualized treatment goals for you. Your goals will be based on your age, other medical conditions you have, and how you respond to diabetes treatment. Generally, the goal of treatment is to maintain the following blood glucose levels:  Before meals (preprandial): 80-130 mg/dL (4.4-7.2 mmol/L).  After meals (postprandial): below 180 mg/dL (10 mmol/L).  A1C level: less than 7%. Supplies needed:  Blood glucose meter.  Test strips for your meter. Each meter has its own strips. You must use the strips that came with your meter.  A needle to prick your finger (lancet). Do not use a lancet more than one time.  A device that holds the lancet (lancing device).  A journal or log book to write down your results. How to check your blood glucose Checking your blood glucose 1. Wash your hands for at least 20 seconds with soap and water. 2. Prick the side of your finger (not the tip) with the lancet. Do not use the same finger consecutively. 3. Gently rub the finger until a small drop of blood appears. 4.  Follow instructions that come with your meter for inserting the test strip, applying blood to the strip, and using your blood glucose meter. 5. Write down your result and any notes in your log.   Using alternative sites Some meters allow you to use areas of your body other  than your finger (alternative sites) to test your blood. The most common alternative sites are the forearm, the thigh, and the palm of your hand. Alternative sites may not be as accurate as the fingers because blood flow is slower in those areas. This means that the result you get may be delayed, and it may be different from the result that you would get from your finger. Use the finger only, and do not use alternative sites, if:  You think you have hypoglycemia.  You sometimes do not know that your blood glucose is getting low (hypoglycemia unawareness). General tips and recommendations Blood glucose log  Every time you check your blood glucose, write down your result. Also write down any notes about things that may be affecting your blood glucose, such as your diet and exercise for the day. This information can help you and your health care provider: ? Look for patterns in your blood glucose over time. ? Adjust your diabetes management plan as needed.  Check if your meter allows you to download your records to a computer or if there is an app for the meter. Most glucose meters store a record of glucose readings in the meter.   If you have type 1 diabetes:  Check your blood glucose 4 or more times a day if you are on intensive insulin therapy with multiple daily injections (MDI) or if you are using an insulin pump. Check your blood glucose: ? Before every meal and snack. ? Before bedtime.  Also check your blood glucose: ? If you have symptoms of hypoglycemia. ? After treating low blood glucose. ? Before doing activities that create a risk for injury, like driving or using machinery. ? Before and after exercise. ? Two hours after a meal. ? Occasionally between 2:00 a.m. and 3:00 a.m., as directed.  You may need to check your blood glucose more often, 6-10 times per day, if: ? You have diabetes that is not well controlled. ? You are ill. ? You have a history of severe  hypoglycemia. ? You have hypoglycemia unawareness. If you have type 2 diabetes:  Check your blood glucose 2 or more times a day if you take insulin or other diabetes medicines.  Check your blood glucose 4 or more times a day if you are on intensive insulin therapy. Occasionally, you may also need to check your glucose between 2:00 a.m. and 3:00 a.m., as directed.  Also check your blood glucose: ? Before and after exercise. ? Before doing activities that create a risk for injury, like driving or using machinery.  You may need to check your blood glucose more often if: ? Your medicine is being adjusted. ? Your diabetes is not well controlled. ? You are ill. General tips  Make sure you always have your supplies with you.  After you use a few boxes of test strips, adjust (calibrate) your blood glucose meter by following instructions that came with your meter.  If you have questions or need help, all blood glucose meters have a 24-hour hotline phone number available that you can call. Also contact your health care provider with questions or concerns you may have. Where to find more information  The  American Diabetes Association: www.diabetes.org  The Association of Diabetes Care & Education Specialists: www.diabeteseducator.org Contact a health care provider if:  Your blood glucose is at or above 240 mg/dL (13.3 mmol/L) for 2 days in a row.  You have been sick or have had a fever for 2 days or longer, and you are not getting better.  You have any of the following problems for more than 6 hours: ? You cannot eat or drink. ? You have nausea or vomiting. ? You have diarrhea. Get help right away if:  Your blood glucose is lower than 54 mg/dL (3 mmol/L).  You become confused, or you have trouble thinking clearly.  You have difficulty breathing.  You have moderate or large ketone levels in your urine. These symptoms may represent a serious problem that is an emergency. Do not wait  to see if the symptoms will go away. Get medical help right away. Call your local emergency services (911 in the [place] Do not drive yourself to the hospital. Summary  Monitoring your blood glucose is an important part of managing your diabetes.  Blood glucose monitoring involves checking your blood glucose as often as directed and keeping a log or record of your results over time.  Your health care provider will set individualized treatment goals for you. Your goals will be based on your age, other medical conditions you have, and how you respond to diabetes treatment.  Every time you check your blood glucose, write down your result. Also, write down any notes about things that may be affecting your blood glucose, such as your diet and exercise for the day. This information is not intended to replace advice given to you by your health care provider. Make sure you discuss any questions you have with your health care provider. Document Revised: 09/04/2020 Document Reviewed: 09/04/2020 Elsevier Patient Education  2021 [place].  Consent to CCM Services: Ms. Feltus was given information about Chronic Care Management services today including:  1. CCM service includes personalized support from designated clinical staff supervised by her physician, including individualized plan of care and coordination with other care providers 2. 24/7 contact phone numbers for assistance for urgent and routine care needs. 3. Service will only be billed when office clinical staff spend 20 minutes or more in a month to coordinate care. 4. Only one practitioner may furnish and bill the service in a calendar month. 5. The patient may stop CCM services at any time (effective at the end of the month) by phone call to the office staff. 6. The patient will be responsible for cost sharing (co-pay) of up to 20% of the service fee (after annual deductible is met).  Patient agreed to services and verbal consent obtained.     The patient verbalized understanding of instructions, educational materials, and care plan provided today and agreed to receive a mailed copy of patient instructions, educational materials, and care plan.   Telephone follow up appointment with care management team member scheduled for: Wednesday Apr 30, 2021 at 1:00 pm  The patient has been provided with contact information for the care management team and has been advised to call with any health related questions or concerns.   [name], RN, BSN, [facility] RN Care Coordination- [place] [phone]: direct office [phone]: mobile    CLINICAL CARE PLAN: Patient Care Plan: Heart Failure (Adult)    Problem Identified: Symptom Exacerbation (Heart Failure)   Priority: High    Long-Range  Goal: Symptom Exacerbation Prevented or Minimized   Start Date: 04/25/2021  Expected End Date: 10/26/2021  This Visit's Progress: On track  Priority: High  Note:   Current Barriers:  Marland Kitchen Knowledge deficit related to basic heart failure pathophysiology and self care management . Multiple recent hospitalizations for CHF exacerbation Case Manager Clinical Goal(s):  Over the next 6 months, as evidenced by patient reporting during CCM RMN CM outreach, patient will: Marland Kitchen Monitor and record daily weights at home (notifying MD of 3 lb weight gain over night or 5 lb in a week) . Independently verbalize understanding of Heart Failure Action Plan and when to call doctor . Take all heart failure mediations as prescribed Interventions:  . Collaboration with Biagio Borg, MD regarding development and update of comprehensive plan of care as evidenced by provider attestation and co-signature . Inter-disciplinary care team collaboration (see longitudinal plan of care) . Chart reviewed including relevant office notes, upcoming scheduled appointments, and lab results . Discussed current  clinical condition with patient and  confirmed no current clinical or medication concerns; confirmed patient manages medications independently at home and reports adherence to medication regimen- patient declines full medication review today, states she is waiting on home health team to arrive at any moment- she agrees to full medication review at time of next scheduled outreach . Reviewed with patient  multiple recent hospitalizations for CHF exacerbation- patient has good general understanding of reasons for hospital visits . Confirmed that patient understands to monitor and record daily weights at home: provided education around basic overview of pathophysiology of heart failure reviewed, including early and late signs of fluid retention . Reviewed recent weights at home with patient: patient reports general weight ranges at home between 360-366 lbs post recent hospital discharge on 04/11/21; reports weight today of 365 lbs . Confirmed patient is active with home health services through [place]: confirmed that she is able to verbalize instructions for taking "extra" diuretic dose as advised by cardiology provider in response to outreach from home health nurse; confirmed patient actively participating in home health services, her ongoing engagement with home health team was encouraged . Reviewed role of diuretics in prevention of fluid overload and management of heart failure . Confirmed patient has scheduled post-hospital discharge office visit with new cardiology provider, with plans to attend video visit as scheduled 05/09/21 Self-Care Activities: . Patient verbalizes understanding of plan to continue monitoring and recording daily weights at home . Self administers medications as prescribed . Attends all scheduled provider appointments . Calls provider office for new concerns or questions Patient Goals: . Weigh myself daily at home: call cardiology office if I gain more than 2 pounds in one day or 5 pounds in one week . Track weight in  diary or on a calendar: it is VERY important to write your daily weights down on paper . Watch for swelling in feet, ankles and legs every day: if you have an increased amount of swelling, make a note of it and let your cardiology provider know . Keep legs up while sitting . Use salt in moderation Follow Up Plan:  . Telephone follow up appointment with care management team member scheduled for: Wednesday Apr 30, 2021 at 1:00 pm . The patient has been provided with contact information for the care management team and has been advised to call with any health related questions or concerns.     Patient Care Plan: Diabetes Type 2 (Adult)    Problem Identified: Glycemic Management (Diabetes, Type  2)   Priority: Medium    Long-Range Goal: Glycemic Management Optimized   Start Date: 04/25/2021  Expected End Date: 10/26/2021  This Visit's Progress: On track  Priority: Medium  Note:   Objective:  Lab Results  Component Value Date   HGBA1C 6.5 (H) 03/07/2021 .   Lab Results  Component Value Date   CREATININE 1.15 04/18/2021   CREATININE 1.22 (H) 04/11/2021   CREATININE 1.39 (H) 04/10/2021 .   Marland Kitchen No results found for: EGFR Current Barriers:  Marland Kitchen Knowledge Deficits related to basic Diabetes pathophysiology and self care/management . New to blood sugar monitoring at home Case Manager Clinical Goal(s):  Over the next 6 months, patient will demonstrate improved adherence to prescribed treatment plan for diabetes self care/management as evidenced by:  . daily monitoring/ recording of blood sugars at home . adherence to ADA/ carb modified diet  . adherence to prescribed medication regimen  . contacting provider for new or worsened symptoms or questions Interventions:  . Collaboration with Biagio Borg, MD regarding development and update of comprehensive plan of care as evidenced by provider attestation and co-signature . Inter-disciplinary care team collaboration (see longitudinal plan of  care) . Review of patient status, including review of consultants reports, relevant laboratory and other test results, and medications completed . Discussed with patient her knowledge of monitoring/ recording blood sugars at home: confirmed that she is new to blood sugar monitoring and verbalizes plans to have the home health RN instruct her in use of new glucometer- this was encouraged; explained to patient that we would review blood sugars from home with each of our phone call visits, she verbalizes understanding . Advised patient, providing education and rationale, to begin monitoring/ recording blood sugars at home 2-3 times per day . Discussed plans with patient for ongoing care management follow up and provided patient with direct contact information for care management team Self-Care Activities . Self administers oral medications as prescribed . Attends all scheduled provider appointments Patient Goals: Marland Kitchen Monitor and record your blood sugar at prescribed times: write down your blood sugars at home first thing in the morning before you eat (fasting) and 2 hours after eating a regular meal . Re- check blood sugar if I feel it is too high or too low . Enter/ record blood sugar readings into daily log- write down your blood sugars on paper in a dedicated place where you can review them easily- we will review your blood sugars each time we talk . Take the blood sugar log to all doctor visits- these values will help your doctor make sure you are on the right medication Follow Up Plan:  . Telephone follow up appointment with care management team member scheduled for: Wednesday Apr 30, 2021 at 1:00 pm . The patient has been provided with contact information for the care management team and has been advised to call with any health related questions or concerns.

## 2021-04-25 NOTE — Telephone Encounter (Signed)
Verbals given  

## 2021-04-25 NOTE — Chronic Care Management (AMB) (Signed)
Chronic Care Management   CCM RN Visit Note  04/25/2021 Name: Maria [name] MRN: [id number] DOB: February 22, 1956  Subjective: Maria [name] is a 65 y.o. year old female who is a primary care patient of Biagio Borg, MD. The care management team was consulted for assistance with disease management and care coordination needs.    Engaged with patient by telephone for initial visit in response to provider referral for case management and/or care coordination services.   Consent to Services:  The patient was given information about Chronic Care Management services, agreed to services, and gave verbal consent 04/14/21 prior to initiation of services.  Please see initial visit note for detailed documentation. Patient agreed to services and verbal consent obtained.   Assessment: Review of patient past medical history, allergies, medications, health status, including review of consultants reports, laboratory and other test data, was performed as part of comprehensive evaluation and provision of chronic care management services.   SDOH (Social Determinants of Health) assessments and interventions performed:  SDOH Interventions   Flowsheet Row Most Recent Value  SDOH Interventions   Food Insecurity Interventions Intervention Not Indicated  [reports has food stamps]  Transportation Interventions Intervention Not Indicated  [reports uses insurance transportation benefits for provider appointments,  uses insta-cart for groceries/ errands]      CCM Care Plan  Allergies  Allergen Reactions  . Amoxicillin-Pot Clavulanate Nausea And Vomiting    Projectile vomiting Did it involve swelling of the face/tongue/throat, SOB, or low BP? No Did it involve sudden or severe rash/hives, skin peeling, or any reaction on the inside of your mouth or nose? No Did you need to seek medical attention at a hospital or doctor's office? No When did it last happen?20-30 years If all above answers are "NO", may  proceed with cephalosporin use.   . Adhesive [Tape] Other (See Comments)    Tears skin off - use paper tape   . Codeine Other (See Comments)    hallucinations  . Crestor [Rosuvastatin Calcium]     Severe muscle cramps  . Lactose Intolerance (Gi) Nausea And Vomiting  . Morphine And Related Nausea And Vomiting and Other (See Comments)    Migraine headaches  . Vicodin [Hydrocodone-Acetaminophen] Other (See Comments)    hallucinations  . Latex Rash    Outpatient Encounter Medications as of 04/25/2021  Medication Sig  . Accu-Chek Softclix Lancets lancets Use as directed up to 4 times daily (Patient taking differently: Use as directed up to 4 times daily)  . acetaminophen (TYLENOL) 325 MG tablet Take 650 mg by mouth every 6 (six) hours as needed for headache (pain).  Marland Kitchen albuterol (PROAIR HFA) 108 (90 Base) MCG/ACT inhaler Inhale 2 puffs into the lungs every 6 (six) hours as needed for wheezing or shortness of breath.  . Blood Glucose Monitoring Suppl (ACCU-CHEK GUIDE) w/Device KIT Use as directed  . clonazePAM (KLONOPIN) 1 MG tablet TAKE 1 TABLET BY MOUTH 2 TIMES A DAY AS NEEDED FOR ANXIETY (Patient taking differently: Take 1 mg by mouth 2 (two) times daily as needed for anxiety.)  . cyclobenzaprine (FLEXERIL) 10 MG tablet TAKE 1 TABLET BY MOUTH 2 TIMES A DAY AS NEEDED FOR MUSCLE SPASMS (Patient taking differently: Take 10 mg by mouth 2 (two) times daily as needed for muscle spasms.)  . glucose blood (ACCU-CHEK GUIDE) test strip Use as instructed up to 4 times daily (Patient taking differently: Use as instructed up to 4 times daily)  . LINZESS 145 MCG CAPS capsule Take  145 mcg by mouth daily as needed (constipation).  Marland Kitchen loperamide (IMODIUM) 2 MG capsule Take 2 mg by mouth as needed for diarrhea or loose stools.  Marland Kitchen losartan (COZAAR) 100 MG tablet TAKE 1 TABLET BY MOUTH DAILY (Patient taking differently: Take 100 mg by mouth daily.)  . magnesium oxide (MAG-OX) 400 MG tablet Take 1 tablet (400 mg  total) by mouth daily.  . metFORMIN (GLUCOPHAGE) 500 MG tablet Take 1 tablet (500 mg total) by mouth 2 (two) times daily with a meal.  . metoprolol tartrate (LOPRESSOR) 25 MG tablet Take 1 tablet (25 mg total) by mouth 2 (two) times daily.  Marland Kitchen NARCAN 4 MG/0.1ML LIQD nasal spray kit Place 0.4 mg into the nose daily as needed (opioid overdose).  . ondansetron (ZOFRAN ODT) 4 MG disintegrating tablet Take 1 tablet (4 mg total) by mouth every 4 (four) hours as needed for nausea or vomiting.  . Oxycodone HCl 10 MG TABS Take 1 tablet (10 mg total) 4 (four) times daily by mouth. Per [facility] (Patient taking differently: Take 10 mg by mouth every 4 (four) hours as needed (pain). Per [facility])  . pantoprazole (PROTONIX) 40 MG tablet TAKE ONE TABLET BY MOUTH DAILY (Patient taking differently: Take 40 mg by mouth daily.)  . polyethylene glycol powder (GLYCOLAX/MIRALAX) 17 GM/SCOOP powder Take 17 g by mouth daily as needed for mild constipation.  . potassium chloride SA (KLOR-CON) 20 MEQ tablet Take 1 tablet (20 mEq total) by mouth 2 (two) times daily.  Marland Kitchen rOPINIRole (REQUIP) 3 MG tablet TAKE 1 TABLET BY MOUTH NIGHTLY AT BEDTIME AS NEEDED (Patient taking differently: Take 3 mg by mouth at bedtime as needed (RLS).)  . Simethicone (GAS-X EXTRA STRENGTH PO) Take 1 tablet by mouth daily as needed (gas).  . Tetrahydrozoline HCl (VISINE OP) Place 1 drop into both eyes daily as needed (dry eyes).  . torsemide (DEMADEX) 20 MG tablet Take 2 tablets (40 mg total) by mouth 2 (two) times daily.  . [DISCONTINUED] DULoxetine (CYMBALTA) 60 MG capsule Take 60 mg by mouth daily.    . [DISCONTINUED] fexofenadine (ALLEGRA) 180 MG tablet Take 180 mg by mouth daily.     No facility-administered encounter medications on file as of 04/25/2021.    Patient Active Problem List   Diagnosis Date Noted  . AKI (acute kidney injury) ([place]) 04/06/2021  . Diastolic congestive heart failure ([place]) 03/17/2021  . Diabetes  mellitus type 2 in obese ([place]) 03/11/2021  . Acute diastolic CHF (congestive heart failure) ([place]) 03/07/2021  . Acute on chronic diastolic (congestive) heart failure ([employer]) 03/06/2021  . Vitamin D deficiency 01/01/2021  . Medial epicondylitis 12/31/2020  . Aortic atherosclerosis ([place]) 09/26/2020  . Umbilical hernia 97/35/3299  . Constipation 08/05/2020  . Arthritis 06/12/2020  . Hoarseness 05/01/2020  . Vitamin B 12 deficiency 01/31/2020  . Rosacea 01/31/2020  . Neck swelling 01/31/2020  . History of colonic polyps   . Benign neoplasm of [place]   . Nausea 08/31/2019  . Throat pain 07/26/2019  . HLD (hyperlipidemia) 05/24/2019  . Leg cramps 05/24/2019  . Lump in neck 05/24/2019  . Hyperglycemia 01/25/2019  . Left thyroid nodule 01/25/2019  . Jerking movements of extremities 07/20/2018  . Balance disorder 07/20/2018  . Right knee pain 03/21/2018  . OSA (obstructive sleep apnea) 03/21/2018  . Oxygen desaturation 03/21/2018  . Urinary symptom or sign 03/21/2018  . Acquired lymphedema 01/25/2018  . Gait disorder 01/25/2018  . Peripheral edema 01/16/2018  . SOB (  shortness of breath) 01/15/2018  . Dysphonia 12/07/2017  . Encounter for well adult exam with abnormal findings 11/08/2017  . 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 [place] polyp 08/11/2016  . Abdominal pain, epigastric   . Dysphagia   . [place] cancer screening   . Prediabetes 07/01/2016  . ACE-inhibitor cough 12/05/2015  . Restless legs syndrome 10/08/2015  . Venous stasis dermatitis of both lower extremities 04/26/2015  . 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 AR (allergic rhinitis) 10/17/2014  . Fatty liver 10/17/2014  . Chronic pain syndrome 09/19/2014  . Post-traumatic osteoarthritis of both knees 09/19/2014  . Spinal stenosis 09/19/2014  . Depression, major, single episode, complete remission ([place]) 09/19/2014  . Narcotic dependence ([place]) 09/19/2014  . Spondylosis of lumbar region without myelopathy  or radiculopathy 03/09/2012  . Lumbar disc disease 03/09/2012  . Chronic pain 03/09/2012  . Morbid obesity ([place]) 06/17/2011  . Chronic low back pain 12/28/2008  . Anxiety state 12/06/2007  . Depression 12/06/2007  . Benign hypertension 12/06/2007  . Gastroesophageal reflux disease without esophagitis 12/06/2007  . LOW BACK PAIN 12/06/2007    Conditions to be addressed/monitored:CHF and DMII  Care Plan : Heart Failure (Adult)  Updates made by [name] Royalty, RN since 04/25/2021 12:00 AM    Problem: Symptom Exacerbation (Heart Failure)   Priority: High    Long-Range Goal: Symptom Exacerbation Prevented or Minimized   Start Date: 04/25/2021  Expected End Date: 10/26/2021  This Visit's Progress: On track  Priority: High  Note:   Current Barriers:  Marland Kitchen Knowledge deficit related to basic heart failure pathophysiology and self care management . Multiple recent hospitalizations for CHF exacerbation Case Manager Clinical Goal(s):  Over the next 6 months, as evidenced by patient reporting during CCM RMN CM outreach, patient will: Marland Kitchen Monitor and record daily weights at home (notifying MD of 3 lb weight gain over night or 5 lb in a week) . Independently verbalize understanding of Heart Failure Action Plan and when to call doctor . Take all heart failure mediations as prescribed Interventions:  . Collaboration with Biagio Borg, MD regarding development and update of comprehensive plan of care as evidenced by provider attestation and co-signature . Inter-disciplinary care team collaboration (see longitudinal plan of care) . Chart reviewed including relevant office notes, upcoming scheduled appointments, and lab results . Discussed current  clinical condition with patient and confirmed no current clinical or medication concerns; confirmed patient manages medications independently at home and reports adherence to medication regimen- patient declines full medication review today, states she is  waiting on home health team to arrive at any moment- she agrees to full medication review at time of next scheduled outreach . Reviewed with patient  multiple recent hospitalizations for CHF exacerbation- patient has good general understanding of reasons for hospital visits . Confirmed that patient understands to monitor and record daily weights at home: provided education around basic overview of pathophysiology of heart failure reviewed, including early and late signs of fluid retention . Reviewed recent weights at home with patient: patient reports general weight ranges at home between 360-366 lbs post recent hospital discharge on 04/11/21; reports weight today of 365 lbs . Confirmed patient is active with home health services through [facility]: confirmed that she is able to verbalize instructions for taking "extra" diuretic dose as advised by cardiology provider in response to outreach from home health nurse; confirmed patient actively participating in home health services, her ongoing engagement with home health team was encouraged . Reviewed role  of diuretics in prevention of fluid overload and management of heart failure . Confirmed patient has scheduled post-hospital discharge office visit with new cardiology provider, with plans to attend video visit as scheduled 05/09/21 Self-Care Activities: . Patient verbalizes understanding of plan to continue monitoring and recording daily weights at home . Self administers medications as prescribed . Attends all scheduled provider appointments . Calls provider office for new concerns or questions Patient Goals: . Weigh myself daily at home: call cardiology office if I gain more than 2 pounds in one day or 5 pounds in one week . Track weight in diary or on a calendar: it is VERY important to write your daily weights down on paper . Watch for swelling in feet, ankles and legs every day: if you have an increased amount of swelling, make a note of it and let  your cardiology provider know . Keep legs up while sitting . Use salt in moderation Follow Up Plan:  . Telephone follow up appointment with care management team member scheduled for: Wednesday Apr 30, 2021 at 1:00 pm . The patient has been provided with contact information for the care management team and has been advised to call with any health related questions or concerns.     Care Plan : Diabetes Type 2 (Adult)  Updates made by [name] Royalty, RN since 04/25/2021 12:00 AM    Problem: Glycemic Management (Diabetes, Type 2)   Priority: Medium    Long-Range Goal: Glycemic Management Optimized   Start Date: 04/25/2021  Expected End Date: 10/26/2021  This Visit's Progress: On track  Priority: Medium  Note:   Objective:  Lab Results  Component Value Date   HGBA1C 6.5 (H) 03/07/2021 .   Lab Results  Component Value Date   CREATININE 1.15 04/18/2021   CREATININE 1.22 (H) 04/11/2021   CREATININE 1.39 (H) 04/10/2021 .   Marland Kitchen No results found for: EGFR Current Barriers:  Marland Kitchen Knowledge Deficits related to basic Diabetes pathophysiology and self care/management . New to blood sugar monitoring at home Case Manager Clinical Goal(s):  Over the next 6 months, patient will demonstrate improved adherence to prescribed treatment plan for diabetes self care/management as evidenced by:  . daily monitoring/ recording of blood sugars at home . adherence to ADA/ carb modified diet  . adherence to prescribed medication regimen  . contacting provider for new or worsened symptoms or questions Interventions:  . Collaboration with Biagio Borg, MD regarding development and update of comprehensive plan of care as evidenced by provider attestation and co-signature . Inter-disciplinary care team collaboration (see longitudinal plan of care) . Review of patient status, including review of consultants reports, relevant laboratory and other test results, and medications completed . Discussed with patient her  knowledge of monitoring/ recording blood sugars at home: confirmed that she is new to blood sugar monitoring and verbalizes plans to have the home health RN instruct her in use of new glucometer- this was encouraged; explained to patient that we would review blood sugars from home with each of our phone call visits, she verbalizes understanding . Advised patient, providing education and rationale, to begin monitoring/ recording blood sugars at home 2-3 times per day . Discussed plans with patient for ongoing care management follow up and provided patient with direct contact information for care management team Self-Care Activities . Self administers oral medications as prescribed . Attends all scheduled provider appointments Patient Goals: Marland Kitchen Monitor and record your blood sugar at prescribed times: write down your blood  sugars at home first thing in the morning before you eat (fasting) and 2 hours after eating a regular meal . Re- check blood sugar if I feel it is too high or too low . Enter/ record blood sugar readings into daily log- write down your blood sugars on paper in a dedicated place where you can review them easily- we will review your blood sugars each time we talk . Take the blood sugar log to all doctor visits- these values will help your doctor make sure you are on the right medication Follow Up Plan:  . Telephone follow up appointment with care management team member scheduled for: Wednesday Apr 30, 2021 at 1:00 pm . The patient has been provided with contact information for the care management team and has been advised to call with any health related questions or concerns.       Plan:  Telephone follow up appointment with care management team member scheduled for:  Wednesday, Apr 30, 2021 at 1:00 pm  The patient has been provided with contact information for the care management team and has been advised to call with any health related questions or concerns.   [name], RN, BSN, [facility] RN Care Coordination- [place] [phone]: direct office [phone]: mobile

## 2021-04-29 ENCOUNTER — Telehealth: Payer: Self-pay | Admitting: Internal Medicine

## 2021-04-29 DIAGNOSIS — E1151 Type 2 diabetes mellitus with diabetic peripheral angiopathy without gangrene: Secondary | ICD-10-CM | POA: Diagnosis not present

## 2021-04-29 DIAGNOSIS — G8929 Other chronic pain: Secondary | ICD-10-CM | POA: Diagnosis not present

## 2021-04-29 DIAGNOSIS — I110 Hypertensive heart disease with heart failure: Secondary | ICD-10-CM | POA: Diagnosis not present

## 2021-04-29 DIAGNOSIS — M19011 Primary osteoarthritis, right shoulder: Secondary | ICD-10-CM | POA: Diagnosis not present

## 2021-04-29 DIAGNOSIS — N179 Acute kidney failure, unspecified: Secondary | ICD-10-CM | POA: Diagnosis not present

## 2021-04-29 DIAGNOSIS — E785 Hyperlipidemia, unspecified: Secondary | ICD-10-CM | POA: Diagnosis not present

## 2021-04-29 DIAGNOSIS — Z9181 History of falling: Secondary | ICD-10-CM | POA: Diagnosis not present

## 2021-04-29 DIAGNOSIS — E876 Hypokalemia: Secondary | ICD-10-CM | POA: Diagnosis not present

## 2021-04-29 DIAGNOSIS — J441 Chronic obstructive pulmonary disease with (acute) exacerbation: Secondary | ICD-10-CM | POA: Diagnosis not present

## 2021-04-29 DIAGNOSIS — I872 Venous insufficiency (chronic) (peripheral): Secondary | ICD-10-CM | POA: Diagnosis not present

## 2021-04-29 DIAGNOSIS — I5032 Chronic diastolic (congestive) heart failure: Secondary | ICD-10-CM | POA: Diagnosis not present

## 2021-04-29 DIAGNOSIS — Z7984 Long term (current) use of oral hypoglycemic drugs: Secondary | ICD-10-CM | POA: Diagnosis not present

## 2021-04-29 DIAGNOSIS — M545 Low back pain, unspecified: Secondary | ICD-10-CM | POA: Diagnosis not present

## 2021-04-29 DIAGNOSIS — K5900 Constipation, unspecified: Secondary | ICD-10-CM | POA: Diagnosis not present

## 2021-04-29 DIAGNOSIS — I700 Atherosclerosis of aorta: Secondary | ICD-10-CM | POA: Diagnosis not present

## 2021-04-29 NOTE — Telephone Encounter (Signed)
Please ask if she can be seen in the office tomorrow afternoon - for some reason the whole afternoon is open for me

## 2021-04-29 NOTE — Telephone Encounter (Signed)
Patient has been notified and unable to come Friday. Per patient, will monitor symptoms and if anything worsens, she will go to the ED.

## 2021-04-29 NOTE — Telephone Encounter (Signed)
Patient states that she is unable to come in tomorrow as she does not have transportation. Per patient, would need to notify transport 3 days prior. Patient has an appointment already scheduled for Friday with pain management.

## 2021-04-29 NOTE — Telephone Encounter (Signed)
Ok we can see on Friday I think if there is a spot open, but she should go to ED if the redness and swelling and any fever gets worse before then

## 2021-04-29 NOTE — Telephone Encounter (Signed)
Maria [name] called and said that the patient has had a 10 pound weight gain since 04-21-21. Stanton Kidney also said that there is extra swelling in her legs since she last seen the patient on 04-25-21 and redness to the right lower extremity. Please advise    Phone: [phone]

## 2021-04-30 ENCOUNTER — Ambulatory Visit: Payer: Medicare Other | Admitting: *Deleted

## 2021-04-30 DIAGNOSIS — E1169 Type 2 diabetes mellitus with other specified complication: Secondary | ICD-10-CM

## 2021-04-30 DIAGNOSIS — I5032 Chronic diastolic (congestive) heart failure: Secondary | ICD-10-CM

## 2021-04-30 NOTE — Patient Instructions (Signed)
[name], it was nice talking with you today   Please read over the attached information, and keep up the great working monitoring and recording your daily weights, blood sugars, and blood pressures at home   I look forward to talking to you again for an update on Thursday May 15, 2021 at 1:00 pm- please be listening out for my call that day.  I will call as close to 1:00 pm as possible; I look forward to hearing about your progress.   Please don't hesitate to contact me if I can be of assistance to you before our next scheduled appointment.   [name], RN, BSN, [facility] RN Care Coordination- [place] [phone]: direct office [phone]: mobile    PATIENT GOALS: Goals Addressed            This Visit's Progress   . Monitor and Manage My Blood Sugar-Diabetes Type 2   On track    Timeframe:  Long-Range Goal Priority:  Medium Start Date:     04/25/21                        Expected End Date:   10/26/21                    Follow Up Date 05/15/21   . Great job starting to monitor and record your blood sugar!  As we discussed today, write down your blood sugars at home first thing in the morning before you eat (fasting) and 2 hours after eating a regular meal- alternating days . Re- check blood sugar if I feel it is too high or too low . Enter/ record blood sugar readings into daily log- write down your blood sugars on paper in a dedicated place where you can review them easily- we will review your blood sugars each time we talk . Take the blood sugar log to all doctor visits- these values will help your doctor make sure you are on the right medication . Please read over the attached information about foods that are good to eat when you have diabetes   Why is this important?    Checking your blood sugar at home helps to keep it from getting very high or very low.   Writing the results in a diary or log helps the doctor  know how to care for you.   Your blood sugar log should have the time, date and the results.   Also, write down the amount of insulin or other medicine that you take.   Other information, like what you ate, exercise done and how you were feeling, will also be helpful.         . Track and Manage Fluids and Swelling-Heart Failure   On track    Timeframe:  Long-Range Goal Priority:  High Start Date:  04/25/21                           Expected End Date:      10/26/21                 Follow Up Date 05/15/21   . Weigh myself daily at home: call cardiology office if I gain more than 2 pounds in one day or 5 pounds in one week: great job staying on top of your daily weights at home and notifying your care providers  when you have weight gain- I am glad that you have reported today that your weights have started dereasing . Track weight in diary or on a calendar: it is VERY important to write your daily weights down on paper . Watch for swelling in feet, ankles and legs every day: if you have an increased amount of swelling, make a note of it and let your cardiology provider know . Keep legs up while sitting . Continue limiting the salt in your diet . Stay as active as possible without over-doing: rest if you start to feel tired or "winded"/ short of breath . Make a list of questions to talk with your new cardiologist about when you have your virtual appointment on May 09, 2021  Why is this important?    It is important to check your weight daily and watch how much salt and liquids you have.   It will help you to manage your heart failure.           Diabetes Mellitus and Nutrition, Adult When you have diabetes, or diabetes mellitus, it is very important to have healthy eating habits because your blood sugar (glucose) levels are greatly affected by what you eat and drink. Eating healthy foods in the right amounts, at about the same times every day, can help you:  Control your blood  glucose.  Lower your risk of heart disease.  Improve your blood pressure.  Reach or maintain a healthy weight. What can affect my meal plan? Every person with diabetes is different, and each person has different needs for a meal plan. Your health care provider may recommend that you work with a dietitian to make a meal plan that is best for you. Your meal plan may vary depending on factors such as:  The calories you need.  The medicines you take.  Your weight.  Your blood glucose, blood pressure, and cholesterol levels.  Your activity level.  Other health conditions you have, such as heart or kidney disease. How do carbohydrates affect me? Carbohydrates, also called carbs, affect your blood glucose level more than any other type of food. Eating carbs naturally raises the amount of glucose in your blood. Carb counting is a method for keeping track of how many carbs you eat. Counting carbs is important to keep your blood glucose at a healthy level, especially if you use insulin or take certain oral diabetes medicines. It is important to know how many carbs you can safely have in each meal. This is different for every person. Your dietitian can help you calculate how many carbs you should have at each meal and for each snack. How does alcohol affect me? Alcohol can cause a sudden decrease in blood glucose (hypoglycemia), especially if you use insulin or take certain oral diabetes medicines. Hypoglycemia can be a life-threatening condition. Symptoms of hypoglycemia, such as sleepiness, dizziness, and confusion, are similar to symptoms of having too much alcohol.  Do not drink alcohol if: ? Your health care provider tells you not to drink. ? You are pregnant, may be pregnant, or are planning to become pregnant.  If you drink alcohol: ? Do not drink on an empty stomach. ? Limit how much you use to:  0-1 drink a day for women.  0-2 drinks a day for men. ? Be aware of how much alcohol  is in your drink. In the [place] one drink equals one 12 oz bottle of beer (355 mL), one 5 oz glass of wine (148 mL),  or one 1 oz glass of hard liquor (44 mL). ? Keep yourself hydrated with water, diet soda, or unsweetened iced tea.  Keep in mind that regular soda, juice, and other mixers may contain a lot of sugar and must be counted as carbs. What are tips for following this plan? Reading food labels  Start by checking the serving size on the "Nutrition Facts" label of packaged foods and drinks. The amount of calories, carbs, fats, and other nutrients listed on the label is based on one serving of the item. Many items contain more than one serving per package.  Check the total grams (g) of carbs in one serving. You can calculate the number of servings of carbs in one serving by dividing the total carbs by 15. For example, if a food has 30 g of total carbs per serving, it would be equal to 2 servings of carbs.  Check the number of grams (g) of saturated fats and trans fats in one serving. Choose foods that have a low amount or none of these fats.  Check the number of milligrams (mg) of salt (sodium) in one serving. Most people should limit total sodium intake to less than 2,300 mg per day.  Always check the nutrition information of foods labeled as "low-fat" or "nonfat." These foods may be higher in added sugar or refined carbs and should be avoided.  Talk to your dietitian to identify your daily goals for nutrients listed on the label. Shopping  Avoid buying canned, pre-made, or processed foods. These foods tend to be high in fat, sodium, and added sugar.  Shop around the outside edge of the grocery store. This is where you will most often find fresh fruits and vegetables, bulk grains, fresh meats, and fresh dairy. Cooking  Use low-heat cooking methods, such as baking, instead of high-heat cooking methods like deep frying.  Cook using healthy oils, such as olive, canola, or sunflower  oil.  Avoid cooking with butter, cream, or high-fat meats. Meal planning  Eat meals and snacks regularly, preferably at the same times every day. Avoid going long periods of time without eating.  Eat foods that are high in fiber, such as fresh fruits, vegetables, beans, and whole grains. Talk with your dietitian about how many servings of carbs you can eat at each meal.  Eat 4-6 oz (112-168 g) of lean protein each day, such as lean meat, chicken, fish, eggs, or tofu. One ounce (oz) of lean protein is equal to: ? 1 oz (28 g) of meat, chicken, or fish. ? 1 egg. ?  cup (62 g) of tofu.  Eat some foods each day that contain healthy fats, such as avocado, nuts, seeds, and fish.   What foods should I eat? Fruits Berries. Apples. Oranges. Peaches. Apricots. Plums. Grapes. Mango. Papaya. Pomegranate. Kiwi. Cherries. Vegetables Lettuce. Spinach. Leafy greens, including kale, chard, collard greens, and mustard greens. Beets. Cauliflower. Cabbage. Broccoli. Carrots. Green beans. Tomatoes. Peppers. Onions. Cucumbers. Brussels sprouts. Grains Whole grains, such as whole-wheat or whole-grain bread, crackers, tortillas, cereal, and pasta. Unsweetened oatmeal. Quinoa. Brown or wild rice. Meats and other proteins Seafood. Poultry without skin. Lean cuts of poultry and beef. Tofu. Nuts. Seeds. Dairy Low-fat or fat-free dairy products such as milk, yogurt, and cheese. The items listed above may not be a complete list of foods and beverages you can eat. Contact a dietitian for more information. What foods should I avoid? Fruits Fruits canned with syrup. Vegetables Canned vegetables. Frozen vegetables with butter  or cream sauce. Grains Refined white flour and flour products such as bread, pasta, snack foods, and cereals. Avoid all processed foods. Meats and other proteins Fatty cuts of meat. Poultry with skin. Breaded or fried meats. Processed meat. Avoid saturated fats. Dairy Full-fat yogurt,  cheese, or milk. Beverages Sweetened drinks, such as soda or iced tea. The items listed above may not be a complete list of foods and beverages you should avoid. Contact a dietitian for more information. Questions to ask a health care provider  Do I need to meet with a diabetes educator?  Do I need to meet with a dietitian?  What number can I call if I have questions?  When are the best times to check my blood glucose? Where to find more information:  American Diabetes Association: diabetes.org  Academy of Nutrition and Dietetics: www.eatright.[employer] of Diabetes and Digestive and Kidney Diseases: [employer]  Association of Diabetes Care and Education Specialists: www.diabeteseducator.org Summary  It is important to have healthy eating habits because your blood sugar (glucose) levels are greatly affected by what you eat and drink.  A healthy meal plan will help you control your blood glucose and maintain a healthy lifestyle.  Your health care provider may recommend that you work with a dietitian to make a meal plan that is best for you.  Keep in mind that carbohydrates (carbs) and alcohol have immediate effects on your blood glucose levels. It is important to count carbs and to use alcohol carefully. This information is not intended to replace advice given to you by your health care provider. Make sure you discuss any questions you have with your health care provider. Document Revised: 11/14/2019 Document Reviewed: 11/14/2019 Elsevier Patient Education  2021 [employer].    The patient verbalized understanding of instructions, educational materials, and care plan provided today and agreed to receive a mailed copy of patient instructions, educational materials, and care plan.   Telephone follow up appointment with care management team member scheduled for: Thursday May 15, 2021 at 1:00 pm  The patient has been provided with contact information for the  care management team and has been advised to call with any health related questions or concerns.   [name], RN, BSN, [facility] RN Care Coordination- [place] [phone]: direct office [phone]: mobile

## 2021-04-30 NOTE — Chronic Care Management (AMB) (Signed)
Chronic Care Management   CCM RN Visit Note  04/30/2021 Name: Maria [name] MRN: [id number] DOB: 1956-07-19  Subjective: Maria [name] is a 65 y.o. year old female who is a primary care patient of Biagio Borg, MD. The care management team was consulted for assistance with disease management and care coordination needs.    Engaged with patient by telephone for follow up visit in response to provider referral for case management and/or care coordination services.   Consent to Services:  The patient was given information about Chronic Care Management services, agreed to services, and gave verbal consent prior to initiation of services.  Please see initial visit note for detailed documentation.  Patient agreed to services and verbal consent obtained.   Assessment: Review of patient past medical history, allergies, medications, health status, including review of consultants reports, laboratory and other test data, was performed as part of comprehensive evaluation and provision of chronic care management services.    CCM Care Plan  Allergies  Allergen Reactions  . Amoxicillin-Pot Clavulanate Nausea And Vomiting    Projectile vomiting Did it involve swelling of the face/tongue/throat, SOB, or low BP? No Did it involve sudden or severe rash/hives, skin peeling, or any reaction on the inside of your mouth or nose? No Did you need to seek medical attention at a hospital or doctor's office? No When did it last happen?20-30 years If all above answers are "NO", may proceed with cephalosporin use.   . Adhesive [Tape] Other (See Comments)    Tears skin off - use paper tape   . Codeine Other (See Comments)    hallucinations  . Crestor [Rosuvastatin Calcium]     Severe muscle cramps  . Lactose Intolerance (Gi) Nausea And Vomiting  . Morphine And Related Nausea And Vomiting and Other (See Comments)    Migraine headaches  . Vicodin [Hydrocodone-Acetaminophen] Other (See Comments)     hallucinations  . Latex Rash    Outpatient Encounter Medications as of 04/30/2021  Medication Sig Note  . Accu-Chek Softclix Lancets lancets Use as directed up to 4 times daily (Patient taking differently: Use as directed up to 4 times daily)   . acetaminophen (TYLENOL) 325 MG tablet Take 650 mg by mouth every 6 (six) hours as needed for headache (pain).   Marland Kitchen albuterol (PROAIR HFA) 108 (90 Base) MCG/ACT inhaler Inhale 2 puffs into the lungs every 6 (six) hours as needed for wheezing or shortness of breath. 04/30/2021: Patient reports used last "a couple of weeks ago" approximately 04/09/21  . Blood Glucose Monitoring Suppl (ACCU-CHEK GUIDE) w/Device KIT Use as directed   . clonazePAM (KLONOPIN) 1 MG tablet TAKE 1 TABLET BY MOUTH 2 TIMES A DAY AS NEEDED FOR ANXIETY (Patient taking differently: Take 1 mg by mouth 2 (two) times daily as needed for anxiety.) 04/30/2021: Patient reports took last on 04/29/21  . cyclobenzaprine (FLEXERIL) 10 MG tablet TAKE 1 TABLET BY MOUTH 2 TIMES A DAY AS NEEDED FOR MUSCLE SPASMS (Patient taking differently: Take 10 mg by mouth 2 (two) times daily as needed for muscle spasms.) 04/30/2021: Patient reports took last "last week"  . glucose blood (ACCU-CHEK GUIDE) test strip Use as instructed up to 4 times daily (Patient taking differently: Use as instructed up to 4 times daily)   . LINZESS 145 MCG CAPS capsule Take 145 mcg by mouth daily as needed (constipation). 04/30/2021: Patient reports took last "about a month ago"  . loperamide (IMODIUM) 2 MG capsule Take 2 mg by  mouth as needed for diarrhea or loose stools. 04/30/2021: Patient took last "about 1-2 weeks ago"  . losartan (COZAAR) 100 MG tablet TAKE 1 TABLET BY MOUTH DAILY (Patient taking differently: Take 100 mg by mouth daily.)   . metFORMIN (GLUCOPHAGE) 500 MG tablet Take 1 tablet (500 mg total) by mouth 2 (two) times daily with a meal.   . metoprolol tartrate (LOPRESSOR) 25 MG tablet Take 1 tablet (25 mg total) by mouth 2  (two) times daily. 04/30/2021: Patient reports taking 50 mg BID (not 25 mg)  . NARCAN 4 MG/0.1ML LIQD nasal spray kit Place 0.4 mg into the nose daily as needed (opioid overdose). 04/30/2021: Reports has "never used; not needed" she has for prn purposes only  . ondansetron (ZOFRAN ODT) 4 MG disintegrating tablet Take 1 tablet (4 mg total) by mouth every 4 (four) hours as needed for nausea or vomiting. 04/30/2021: Reports has needed recently; took last "a week ago"  . Oxycodone HCl 10 MG TABS Take 1 tablet (10 mg total) 4 (four) times daily by mouth. Per [facility] (Patient taking differently: Take 10 mg by mouth every 4 (four) hours as needed (pain). Per [facility])   . pantoprazole (PROTONIX) 40 MG tablet TAKE ONE TABLET BY MOUTH DAILY (Patient taking differently: Take 40 mg by mouth daily.)   . polyethylene glycol powder (GLYCOLAX/MIRALAX) 17 GM/SCOOP powder Take 17 g by mouth daily as needed for mild constipation. 04/30/2021: Reports used last "a long time, about a month ago when I was in the hospital"  . potassium chloride SA (KLOR-CON) 20 MEQ tablet Take 1 tablet (20 mEq total) by mouth 2 (two) times daily.   Marland Kitchen rOPINIRole (REQUIP) 3 MG tablet TAKE 1 TABLET BY MOUTH NIGHTLY AT BEDTIME AS NEEDED (Patient taking differently: Take 3 mg by mouth at bedtime as needed (RLS).)   . Simethicone (GAS-X EXTRA STRENGTH PO) Take 1 tablet by mouth daily as needed (gas). 04/30/2021: Patient reports used last "the other night"  . Tetrahydrozoline HCl (VISINE OP) Place 1 drop into both eyes daily as needed (dry eyes).   . torsemide (DEMADEX) 20 MG tablet Take 2 tablets (40 mg total) by mouth 2 (two) times daily.   . magnesium oxide (MAG-OX) 400 MG tablet Take 1 tablet (400 mg total) by mouth daily. (Patient not taking: Reported on 04/30/2021) 04/30/2021: Reports last dose taken "about a week or two ago" waiting to obtain additional OTC doses- encouraged to do promptly; patient verbalizes understanding/  states will do  . [DISCONTINUED] DULoxetine (CYMBALTA) 60 MG capsule Take 60 mg by mouth daily.     . [DISCONTINUED] fexofenadine (ALLEGRA) 180 MG tablet Take 180 mg by mouth daily.      No facility-administered encounter medications on file as of 04/30/2021.    Patient Active Problem List   Diagnosis Date Noted  . AKI (acute kidney injury) ([place]) 04/06/2021  . Diastolic congestive heart failure ([employer]) 03/17/2021  . Diabetes mellitus type 2 in obese ([place]) 03/11/2021  . Acute diastolic CHF (congestive heart failure) ([place]) 03/07/2021  . Acute on chronic diastolic (congestive) heart failure ([place]) 03/06/2021  . Vitamin D deficiency 01/01/2021  . Medial epicondylitis 12/31/2020  . Aortic atherosclerosis ([place]) 09/26/2020  . Umbilical hernia 71/05/2693  . Constipation 08/05/2020  . Arthritis 06/12/2020  . Hoarseness 05/01/2020  . Vitamin B 12 deficiency 01/31/2020  . Rosacea 01/31/2020  . Neck swelling 01/31/2020  . History of colonic polyps   . Benign neoplasm of [place]   .  Nausea 08/31/2019  . Throat pain 07/26/2019  . HLD (hyperlipidemia) 05/24/2019  . Leg cramps 05/24/2019  . Lump in neck 05/24/2019  . Hyperglycemia 01/25/2019  . Left thyroid nodule 01/25/2019  . Jerking movements of extremities 07/20/2018  . Balance disorder 07/20/2018  . Right knee pain 03/21/2018  . OSA (obstructive sleep apnea) 03/21/2018  . Oxygen desaturation 03/21/2018  . Urinary symptom or sign 03/21/2018  . Acquired lymphedema 01/25/2018  . Gait disorder 01/25/2018  . Peripheral edema 01/16/2018  . SOB (shortness of breath) 01/15/2018  . Dysphonia 12/07/2017  . Encounter for well adult exam with abnormal findings 11/08/2017  . 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 [place] polyp 08/11/2016  . Abdominal pain, epigastric   . Dysphagia   . [place] cancer screening   . Prediabetes 07/01/2016  . ACE-inhibitor cough 12/05/2015  . Restless legs syndrome 10/08/2015  . Venous stasis dermatitis of both  lower extremities 04/26/2015  . 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 AR (allergic rhinitis) 10/17/2014  . Fatty liver 10/17/2014  . Chronic pain syndrome 09/19/2014  . Post-traumatic osteoarthritis of both knees 09/19/2014  . Spinal stenosis 09/19/2014  . Depression, major, single episode, complete remission ([place]) 09/19/2014  . Narcotic dependence ([place]) 09/19/2014  . Spondylosis of 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 03/09/2012  . Lumbar disc disease 03/09/2012  . Chronic pain 03/09/2012  . Morbid obesity ([place]) 06/17/2011  . Chronic low back pain 12/28/2008  . Anxiety state 12/06/2007  . Depression 12/06/2007  . Benign hypertension 12/06/2007  . Gastroesophageal reflux disease without esophagitis 12/06/2007  . LOW BACK PAIN 12/06/2007    Conditions to be addressed/monitored:CHF and DMII  Care Plan : Heart Failure (Adult)  Updates made by [name] Royalty, RN since 04/30/2021 12:00 AM    Problem: Symptom Exacerbation (Heart Failure)   Priority: High    Long-Range Goal: Symptom Exacerbation Prevented or Minimized   Start Date: 04/25/2021  Expected End Date: 10/26/2021  This Visit's Progress: On track  Recent Progress: On track  Priority: High  Note:   Current Barriers:  Marland Kitchen Knowledge deficit related to basic heart failure pathophysiology and self care management . Multiple recent hospitalizations for CHF exacerbation Case Manager Clinical Goal(s):  Over the next 6 months, as evidenced by patient reporting during CCM RMN CM outreach, patient will: Marland Kitchen Monitor and record daily weights at home (notifying MD of 3 lb weight gain over night or 5 lb in a week) . Independently verbalize understanding of Heart Failure Action Plan and when to call doctor . Take all heart failure mediations as prescribed Interventions:  . Collaboration with Biagio Borg, MD regarding development and update of comprehensive plan of care as evidenced by provider attestation and  co-signature . Inter-disciplinary care team collaboration (see longitudinal plan of care) . Chart reviewed including relevant office notes, upcoming scheduled appointments, and lab results . Discussed current  clinical condition with patient and confirmed no current clinical or medication concerns; discussed recent phone call to PCP office re: weight gain at home- patient reports today that her lower extremities remain swollen, but her daily weights have started to decrease: reports weight today at: 361 lbs; reinforced previously provided education around weight gain guidelines and action plan for weight gain at home . Full medication review completed: no concerns/ discrepancies identified: medication list in EHR updated according to patient report . Confirmed patient remains active with home health services through [facility]: encouraged patient's ongoing active participation/ engagement in home health services .  Confirmed patient has scheduled post-hospital discharge office visit with new cardiology provider, with plans to attend video visit as scheduled 05/09/21 Self-Care Activities: . Patient verbalizes understanding of plan to continue monitoring and recording daily weights at home . Self administers medications as prescribed . Attends all scheduled provider appointments . Calls provider office for new concerns or questions Patient Goals: . Weigh myself daily at home: call cardiology office if I gain more than 2 pounds in one day or 5 pounds in one week: great job staying on top of your daily weights at home and notifying your care providers when you have weight gain- I am glad that you have reported today that your weights have started dereasing . Track weight in diary or on a calendar: it is VERY important to write your daily weights down on paper . Watch for swelling in feet, ankles and legs every day: if you have an increased amount of swelling, make a note of it and let your cardiology provider  know . Keep legs up while sitting . Continue limiting the salt in your diet . Stay as active as possible without over-doing: rest if you start to feel tired or "winded"/ short of breath . Make a list of questions to talk with your new cardiologist about when you have your virtual appointment on May 09, 2021 Follow Up Plan:  . Telephone follow up appointment with care management team member scheduled for: Thursday May 15, 2021 at 1:00 pm . The patient has been provided with contact information for the care management team and has been advised to call with any health related questions or concerns.     Care Plan : Diabetes Type 2 (Adult)  Updates made by [name] Royalty, RN since 04/30/2021 12:00 AM    Problem: Glycemic Management (Diabetes, Type 2)   Priority: Medium    Long-Range Goal: Glycemic Management Optimized   Start Date: 04/25/2021  Expected End Date: 10/26/2021  This Visit's Progress: On track  Recent Progress: On track  Priority: Medium  Note:   Objective:  Lab Results  Component Value Date   HGBA1C 6.5 (H) 03/07/2021 .   Lab Results  Component Value Date   CREATININE 1.15 04/18/2021   CREATININE 1.22 (H) 04/11/2021   CREATININE 1.39 (H) 04/10/2021 .   Marland Kitchen No results found for: EGFR Current Barriers:  Marland Kitchen Knowledge Deficits related to basic Diabetes pathophysiology and self care/management . New to blood sugar monitoring at home Case Manager Clinical Goal(s):  Over the next 6 months, patient will demonstrate improved adherence to prescribed treatment plan for diabetes self care/management as evidenced by:  . daily monitoring/ recording of blood sugars at home . adherence to ADA/ carb modified diet  . adherence to prescribed medication regimen  . contacting provider for new or worsened symptoms or questions Interventions:  . Collaboration with Biagio Borg, MD regarding development and update of comprehensive plan of care as evidenced by provider attestation and  co-signature . Inter-disciplinary care team collaboration (see longitudinal plan of care) . Review of patient status, including review of consultants reports, relevant laboratory and other test results, and medications completed . Discussed with patient her knowledge of monitoring/ recording blood sugars at home: confirmed that she has received instruction in monitoring blood sugars at home and has started doing; assisted patient in creating dedicated log on paper to record blood sugars and re- explained to patient that we would review blood sugars from home with each of our phone call visits,  she verbalizes understanding . Advised patient, providing education and rationale, to begin monitoring/ recording blood sugars at home one time per day: alternating days for fasting and post-prandial values:  explained rationale for same . Walked patient through checking blood sugar telephonically: patient's post-prandial blood sugar today is 105; she reports ranges over last week between 104-107 . Discussed dietary strategies to consider in setting of DM and provided printed educational material around same- encouraged patient to review and write down questions . Encouraged patient to schedule annual eye exam for 2022 and initiated conversation/ education around complications of uncontrolled diabetes  . Discussed plans with patient for ongoing care management follow up and provided patient with direct contact information for care management team Self-Care Activities . Self administers oral medications as prescribed . Attends all scheduled provider appointments Patient Goals: Marland Kitchen Great job starting to monitor and record your blood sugar!  As we discussed today, write down your blood sugars at home first thing in the morning before you eat (fasting) and 2 hours after eating a regular meal- alternating days . Re- check blood sugar if I feel it is too high or too low . Enter/ record blood sugar readings into daily  log- write down your blood sugars on paper in a dedicated place where you can review them easily- we will review your blood sugars each time we talk . Take the blood sugar log to all doctor visits- these values will help your doctor make sure you are on the right medication . Please read over the attached information about foods that are good to eat when you have diabetes Follow Up Plan:  . Telephone follow up appointment with care management team member scheduled for: Thursday May 15, 2021 at 1:00 pm . The patient has been provided with contact information for the care management team and has been advised to call with any health related questions or concerns.       Plan:  Telephone follow up appointment with care management team member scheduled for:  Thursday May 15, 2021 at 1:00 pm  The patient has been provided with contact information for the care management team and has been advised to call with any health related questions or concerns.   [name], RN, BSN, [facility] RN Care Coordination- Combine [phone]: direct office [phone]: mobile

## 2021-05-01 ENCOUNTER — Other Ambulatory Visit: Payer: Self-pay

## 2021-05-01 ENCOUNTER — Emergency Department (HOSPITAL_COMMUNITY): Payer: Medicare Other

## 2021-05-01 ENCOUNTER — Emergency Department (HOSPITAL_COMMUNITY)
Admission: EM | Admit: 2021-05-01 | Discharge: 2021-05-01 | Disposition: A | Discharge status: Home / Self Care | Payer: Medicare Other | Attending: Emergency Medicine | Admitting: Emergency Medicine

## 2021-05-01 DIAGNOSIS — Z91040 Latex allergy status: Secondary | ICD-10-CM | POA: Insufficient documentation

## 2021-05-01 DIAGNOSIS — N179 Acute kidney failure, unspecified: Secondary | ICD-10-CM | POA: Diagnosis not present

## 2021-05-01 DIAGNOSIS — I5032 Chronic diastolic (congestive) heart failure: Secondary | ICD-10-CM | POA: Diagnosis not present

## 2021-05-01 DIAGNOSIS — K5900 Constipation, unspecified: Secondary | ICD-10-CM | POA: Diagnosis not present

## 2021-05-01 DIAGNOSIS — Z9181 History of falling: Secondary | ICD-10-CM | POA: Diagnosis not present

## 2021-05-01 DIAGNOSIS — Z79899 Other long term (current) drug therapy: Secondary | ICD-10-CM | POA: Insufficient documentation

## 2021-05-01 DIAGNOSIS — I5033 Acute on chronic diastolic (congestive) heart failure: Secondary | ICD-10-CM | POA: Insufficient documentation

## 2021-05-01 DIAGNOSIS — R609 Edema, unspecified: Secondary | ICD-10-CM | POA: Diagnosis not present

## 2021-05-01 DIAGNOSIS — R0602 Shortness of breath: Secondary | ICD-10-CM | POA: Diagnosis not present

## 2021-05-01 DIAGNOSIS — Z7984 Long term (current) use of oral hypoglycemic drugs: Secondary | ICD-10-CM | POA: Insufficient documentation

## 2021-05-01 DIAGNOSIS — E1151 Type 2 diabetes mellitus with diabetic peripheral angiopathy without gangrene: Secondary | ICD-10-CM | POA: Diagnosis not present

## 2021-05-01 DIAGNOSIS — Z87891 Personal history of nicotine dependence: Secondary | ICD-10-CM | POA: Diagnosis not present

## 2021-05-01 DIAGNOSIS — E785 Hyperlipidemia, unspecified: Secondary | ICD-10-CM | POA: Insufficient documentation

## 2021-05-01 DIAGNOSIS — M545 Low back pain, unspecified: Secondary | ICD-10-CM | POA: Diagnosis not present

## 2021-05-01 DIAGNOSIS — R1900 Intra-abdominal and pelvic swelling, mass and lump, unspecified site: Secondary | ICD-10-CM | POA: Diagnosis not present

## 2021-05-01 DIAGNOSIS — I110 Hypertensive heart disease with heart failure: Secondary | ICD-10-CM | POA: Diagnosis not present

## 2021-05-01 DIAGNOSIS — M19011 Primary osteoarthritis, right shoulder: Secondary | ICD-10-CM | POA: Diagnosis not present

## 2021-05-01 DIAGNOSIS — I872 Venous insufficiency (chronic) (peripheral): Secondary | ICD-10-CM | POA: Diagnosis not present

## 2021-05-01 DIAGNOSIS — E1169 Type 2 diabetes mellitus with other specified complication: Secondary | ICD-10-CM | POA: Insufficient documentation

## 2021-05-01 DIAGNOSIS — J441 Chronic obstructive pulmonary disease with (acute) exacerbation: Secondary | ICD-10-CM | POA: Diagnosis not present

## 2021-05-01 DIAGNOSIS — I509 Heart failure, unspecified: Secondary | ICD-10-CM

## 2021-05-01 DIAGNOSIS — I517 Cardiomegaly: Secondary | ICD-10-CM | POA: Diagnosis not present

## 2021-05-01 DIAGNOSIS — I700 Atherosclerosis of aorta: Secondary | ICD-10-CM | POA: Diagnosis not present

## 2021-05-01 DIAGNOSIS — G8929 Other chronic pain: Secondary | ICD-10-CM | POA: Diagnosis not present

## 2021-05-01 DIAGNOSIS — E876 Hypokalemia: Secondary | ICD-10-CM | POA: Diagnosis not present

## 2021-05-01 DIAGNOSIS — R6889 Other general symptoms and signs: Secondary | ICD-10-CM | POA: Diagnosis not present

## 2021-05-01 DIAGNOSIS — Z743 Need for continuous supervision: Secondary | ICD-10-CM | POA: Diagnosis not present

## 2021-05-01 LAB — BRAIN NATRIURETIC PEPTIDE: B Natriuretic Peptide: 14.6 pg/mL (ref 0.0–100.0)

## 2021-05-01 LAB — CBC WITH DIFFERENTIAL/PLATELET
Abs Immature Granulocytes: 0.02 10*3/uL (ref 0.00–0.07)
Basophils Absolute: 0.1 10*3/uL (ref 0.0–0.1)
Basophils Relative: 1 %
Eosinophils Absolute: 0.1 10*3/uL (ref 0.0–0.5)
Eosinophils Relative: 2 %
HCT: 37.2 % (ref 36.0–46.0)
Hemoglobin: 12.4 g/dL (ref 12.0–15.0)
Immature Granulocytes: 0 %
Lymphocytes Relative: 36 %
Lymphs Abs: 2.5 10*3/uL (ref 0.7–4.0)
MCH: 30.9 pg (ref 26.0–34.0)
MCHC: 33.3 g/dL (ref 30.0–36.0)
MCV: 92.8 fL (ref 80.0–100.0)
Monocytes Absolute: 0.5 10*3/uL (ref 0.1–1.0)
Monocytes Relative: 7 %
Neutro Abs: 3.8 10*3/uL (ref 1.7–7.7)
Neutrophils Relative %: 54 %
Platelets: 274 10*3/uL (ref 150–400)
RBC: 4.01 MIL/uL (ref 3.87–5.11)
RDW: 12.9 % (ref 11.5–15.5)
WBC: 7 10*3/uL (ref 4.0–10.5)
nRBC: 0 % (ref 0.0–0.2)

## 2021-05-01 LAB — COMPREHENSIVE METABOLIC PANEL
ALT: 22 U/L (ref 0–44)
AST: 23 U/L (ref 15–41)
Albumin: 4 g/dL (ref 3.5–5.0)
Alkaline Phosphatase: 63 U/L (ref 38–126)
Anion gap: 9 (ref 5–15)
BUN: 20 mg/dL (ref 8–23)
CO2: 35 mmol/L — ABNORMAL HIGH (ref 22–32)
Calcium: 8.9 mg/dL (ref 8.9–10.3)
Chloride: 97 mmol/L — ABNORMAL LOW (ref 98–111)
Creatinine, Ser: 1.27 mg/dL — ABNORMAL HIGH (ref 0.44–1.00)
GFR, Estimated: 48 mL/min — ABNORMAL LOW (ref >60)
Glucose, Bld: 90 mg/dL (ref 70–99)
Potassium: 3.5 mmol/L (ref 3.5–5.1)
Sodium: 141 mmol/L (ref 135–145)
Total Bilirubin: 1.1 mg/dL (ref 0.3–1.2)
Total Protein: 7.3 g/dL (ref 6.5–8.1)

## 2021-05-01 MED ORDER — FUROSEMIDE 10 MG/ML IJ SOLN
40.0000 mg | Freq: Once | INTRAMUSCULAR | Status: AC
  Administered 2021-05-01: 40 mg via INTRAVENOUS
  Filled 2021-05-01: qty 4

## 2021-05-01 MED ORDER — OXYCODONE HCL 5 MG PO TABS
10.0000 mg | ORAL_TABLET | ORAL | Status: AC
  Administered 2021-05-01: 10 mg via ORAL
  Filled 2021-05-01: qty 2

## 2021-05-01 NOTE — ED Provider Notes (Signed)
[facility] EMERGENCY DEPARTMENT Provider Note   CSN: [id number] Arrival date & time: 05/01/21  1218     History Chief Complaint  Patient presents with  . Congestive Heart Failure    Maria [name] is a 65 y.o. female with past medical history of anxiety, CHF, hypertension that presents the emergency department today for weight gain, sent by PCP.  Patient states that she has CHF, has been chronically having swelling over the past couple months, fluctuates.  States over the past couple of days the swelling has increased and she has gained 3 pounds overnightspoke to see her PCP who sent her to the hospital here.  Patient states that she feels as if there is fluid in her lungs, abdomen and lower legs.  Patient states that her legs are chronically swollen. No changes with legs. NO swelling in thighs.  Denies any fevers or chills.  Patient denies any chest pain, does admit to some shortness of breath which is constant, worse with exertion. Not worse today.  Denies any cough or URI symptoms.  Has been compliant with her torsemide, states that she took 40 mg for she came here.  Denies any triggering factors, states that she has been sticking to a nonfat low-sodium diet.  No abdominal pain nausea or vomiting, no other complaints.  HPI     Past Medical History:  Diagnosis Date  . Acute lymphadenitis 2011  . ALLERGIC RHINITIS 08/10/2007  . ANXIETY 12/06/2007  . Atherosclerotic peripheral vascular disease ([place]) 06/13/2013   Aorta on CT June 2014  . Cellulitis 2011  . Cervical disc disease 03/09/2012  . CHF (congestive heart failure) ([place])   . Chronic pain 03/09/2012  . DEPRESSION 12/06/2007  . Diabetes ([place])   . DIVERTICULOSIS, [place] 12/06/2007  . GERD 12/06/2007  . Hepatitis    age 81 hepatitis A  . HLD (hyperlipidemia) 05/24/2019  . HYPERTENSION 12/06/2007  . Lumbar disc disease 03/09/2012  . Mesenteric adenitis   . MRSA 2006  . 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  THORACIC/LUMBOSACRAL NEURITIS/RADICULITIS UNSPEC 12/28/2008    Patient Active Problem List   Diagnosis Date Noted  . AKI (acute kidney injury) ([place]) 04/06/2021  . Diastolic congestive heart failure ([place]) 03/17/2021  . Diabetes mellitus type 2 in obese ([place]) 03/11/2021  . Acute diastolic CHF (congestive heart failure) ([place]) 03/07/2021  . Acute on chronic diastolic (congestive) heart failure ([place]) 03/06/2021  . Vitamin D deficiency 01/01/2021  . Medial epicondylitis 12/31/2020  . Aortic atherosclerosis ([place]) 09/26/2020  . Umbilical hernia 95/18/8416  . Constipation 08/05/2020  . Arthritis 06/12/2020  . Hoarseness 05/01/2020  . Vitamin B 12 deficiency 01/31/2020  . Rosacea 01/31/2020  . Neck swelling 01/31/2020  . History of colonic polyps   . Benign neoplasm of [place]   . Nausea 08/31/2019  . Throat pain 07/26/2019  . HLD (hyperlipidemia) 05/24/2019  . Leg cramps 05/24/2019  . Lump in neck 05/24/2019  . Hyperglycemia 01/25/2019  . Left thyroid nodule 01/25/2019  . Jerking movements of extremities 07/20/2018  . Balance disorder 07/20/2018  . Right knee pain 03/21/2018  . OSA (obstructive sleep apnea) 03/21/2018  . Oxygen desaturation 03/21/2018  . Urinary symptom or sign 03/21/2018  . Acquired lymphedema 01/25/2018  . Gait disorder 01/25/2018  . Peripheral edema 01/16/2018  . SOB (shortness of breath) 01/15/2018  . Dysphonia 12/07/2017  . Encounter for well adult exam with abnormal findings 11/08/2017  . 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[place]) 05/31/2017  . [place] polyp 08/11/2016  .  Abdominal pain, epigastric   . Dysphagia   . [place] cancer screening   . Prediabetes 07/01/2016  . ACE-inhibitor cough 12/05/2015  . Restless legs syndrome 10/08/2015  . Venous stasis dermatitis of both lower extremities 04/26/2015  . 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 AR (allergic rhinitis) 10/17/2014  . Fatty liver 10/17/2014  . Chronic pain syndrome 09/19/2014  . Post-traumatic  osteoarthritis of both knees 09/19/2014  . Spinal stenosis 09/19/2014  . Depression, major, single episode, complete remission ([place]) 09/19/2014  . Narcotic dependence ([place]) 09/19/2014  . Spondylosis of 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 03/09/2012  . Lumbar disc disease 03/09/2012  . Chronic pain 03/09/2012  . Morbid obesity ([place]) 06/17/2011  . Chronic low back pain 12/28/2008  . Anxiety state 12/06/2007  . Depression 12/06/2007  . Benign hypertension 12/06/2007  . Gastroesophageal reflux disease without esophagitis 12/06/2007  . LOW BACK PAIN 12/06/2007    Past Surgical History:  Procedure Laterality Date  . ABDOMINAL HYSTERECTOMY  1999   1 ovary left  . bloo clot removed from neck   may 25th , june 2. 2010  . [place] SURGERY  may 24th 2010  . COLONOSCOPY WITH PROPOFOL N/A 07/10/2016   Procedure: COLONOSCOPY WITH PROPOFOL;  Surgeon: Manus Gunning, MD;  Location: [facility];  Service: Gastroenterology;  Laterality: N/A;  . COLONOSCOPY WITH PROPOFOL N/A 09/26/2019   Procedure: COLONOSCOPY WITH PROPOFOL;  Surgeon: Yetta Flock, MD;  Location: [facility];  Service: Gastroenterology;  Laterality: N/A;  . ESOPHAGOGASTRODUODENOSCOPY (EGD) WITH PROPOFOL N/A 07/10/2016   Procedure: ESOPHAGOGASTRODUODENOSCOPY (EGD) WITH PROPOFOL;  Surgeon: Manus Gunning, MD;  Location: [facility];  Service: Gastroenterology;  Laterality: N/A;  . POLYPECTOMY  09/26/2019   Procedure: POLYPECTOMY;  Surgeon: Yetta Flock, MD;  Location: [facility];  Service: Gastroenterology;;  . s/p ovary cyst    . s/p right knee arthroscopy     Dr. Mardelle Matte ortho     OB History   No obstetric history on file.     Family History  Problem Relation Age of Onset  . Heart disease Mother   . Hypertension Mother   . Diabetes Mother   . Heart failure Mother   . Asthma Sister   . Anxiety disorder Sister   . Depression Sister   . Hypertension Father   . Asthma  Daughter   . Bipolar disorder Daughter   . Cancer Maternal Uncle        [place]  . Cancer Other        ovarian  . Liver cancer Paternal Grandmother        ????    Social History   Tobacco Use  . Smoking status: Former Smoker    Years: 30.00    Quit date: 11/08/2008    Years since quitting: 12.4  . Smokeless tobacco: Never Used  . Tobacco comment: quit 10/09  Vaping Use  . Vaping Use: Never used  Substance Use Topics  . Alcohol use: No    Comment: quit drinking 10/07/1997  . Drug use: No    Home Medications Prior to Admission medications   Medication Sig Start Date End Date Taking? Authorizing Provider  Accu-Chek Softclix Lancets lancets Use as directed up to 4 times daily Patient taking differently: 1 each by Other route in the morning, at noon, in the evening, and at bedtime. 04/11/21  Yes Elodia Florence., MD  acetaminophen (TYLENOL) 325 MG tablet Take 650 mg by mouth every 6 (six)  hours as needed for headache (pain).   Yes [provider]  albuterol (PROAIR HFA) 108 (90 Base) MCG/ACT inhaler Inhale 2 puffs into the lungs every 6 (six) hours as needed for wheezing or shortness of breath. 11/05/20  Yes Colin Benton R, DO  Blood Glucose Monitoring Suppl (ACCU-CHEK GUIDE) w/Device KIT Use as directed Patient taking differently: 1 each by Other route as directed. 04/11/21  Yes Elodia Florence., MD  clonazePAM (KLONOPIN) 1 MG tablet TAKE 1 TABLET BY MOUTH 2 TIMES A DAY AS NEEDED FOR ANXIETY Patient taking differently: Take 1 mg by mouth 2 (two) times daily as needed for anxiety. 12/10/20  Yes Biagio Borg, MD  cyclobenzaprine (FLEXERIL) 10 MG tablet TAKE 1 TABLET BY MOUTH 2 TIMES A DAY AS NEEDED FOR MUSCLE SPASMS Patient taking differently: Take 10 mg by mouth 2 (two) times daily as needed for muscle spasms. 02/27/21  Yes Biagio Borg, MD  glucose blood (ACCU-CHEK GUIDE) test strip Use as instructed up to 4 times daily Patient taking differently: Use as  instructed up to 4 times daily 04/11/21  Yes Elodia Florence., MD  LINZESS 145 MCG CAPS capsule Take 145 mcg by mouth daily as needed (constipation). 08/15/20  Yes [provider]  loperamide (IMODIUM) 2 MG capsule Take 2 mg by mouth as needed for diarrhea or loose stools.   Yes [provider]  losartan (COZAAR) 100 MG tablet TAKE 1 TABLET BY MOUTH DAILY Patient taking differently: Take 100 mg by mouth daily. 08/06/20  Yes Biagio Borg, MD  magnesium oxide (MAG-OX) 400 MG tablet Take 1 tablet (400 mg total) by mouth daily. 04/12/21 05/12/21 Yes Elodia Florence., MD  metFORMIN (GLUCOPHAGE) 500 MG tablet Take 1 tablet (500 mg total) by mouth 2 (two) times daily with a meal. 03/11/21 05/10/21 Yes Bonnielee Haff, MD  metoprolol tartrate (LOPRESSOR) 25 MG tablet Take 1 tablet (25 mg total) by mouth 2 (two) times daily. 04/11/21 05/11/21 Yes Elodia Florence., MD  [facility] 4 MG/0.1ML LIQD nasal spray kit Place 0.4 mg into the nose daily as needed (opioid overdose). 04/25/19  Yes [provider]  ondansetron (ZOFRAN ODT) 4 MG disintegrating tablet Take 1 tablet (4 mg total) by mouth every 4 (four) hours as needed for nausea or vomiting. 08/23/19  Yes Charlesetta Shanks, MD  Oxycodone HCl 10 MG TABS Take 1 tablet (10 mg total) 4 (four) times daily by mouth. Per [facility] Patient taking differently: Take 10 mg by mouth every 4 (four) hours as needed (pain). Per [facility] 11/08/17  Yes Biagio Borg, MD  pantoprazole (PROTONIX) 40 MG tablet TAKE ONE TABLET BY MOUTH DAILY Patient taking differently: Take 40 mg by mouth daily. 03/03/21  Yes Biagio Borg, MD  polyethylene glycol powder [facility]) 17 GM/SCOOP powder Take 17 g by mouth daily as needed for mild constipation. 04/11/21  Yes Elodia Florence., MD  potassium chloride SA (KLOR-CON) 20 MEQ tablet Take 1 tablet (20 mEq total) by mouth 2 (two) times daily. 04/11/21 05/11/21 Yes Elodia Florence., MD  rOPINIRole (REQUIP) 3 MG tablet TAKE 1 TABLET BY MOUTH NIGHTLY AT BEDTIME AS NEEDED Patient taking differently: Take 3 mg by mouth at bedtime as needed (RLS). 02/04/21  Yes Biagio Borg, MD  Simethicone (GAS-X EXTRA STRENGTH PO) Take 1 tablet by mouth daily as needed (gas).   Yes [provider]  Tetrahydrozoline HCl (VISINE OP) Place  1 drop into both eyes daily as needed (dry eyes).   Yes [provider]  torsemide (DEMADEX) 20 MG tablet Take 2 tablets (40 mg total) by mouth 2 (two) times daily. 04/11/21 05/11/21 Yes Elodia Florence., MD  DULoxetine (CYMBALTA) 60 MG capsule Take 60 mg by mouth daily.    03/09/12  [provider]  fexofenadine (ALLEGRA) 180 MG tablet Take 180 mg by mouth daily.    03/09/12  [provider]    Allergies    Amoxicillin-pot clavulanate, Adhesive [tape], Codeine, Crestor [rosuvastatin calcium], Lactose intolerance (gi), Morphine and related, Vicodin [hydrocodone-acetaminophen], and Latex  Review of Systems   Review of Systems  Constitutional: Positive for unexpected weight change. Negative for chills, diaphoresis, fatigue and fever.  HENT: Negative for congestion, sore throat and trouble swallowing.   Eyes: Negative for pain and visual disturbance.  Respiratory: Positive for shortness of breath. Negative for cough and wheezing.   Cardiovascular: Positive for leg swelling. Negative for chest pain and palpitations.  Gastrointestinal: Negative for abdominal distention, abdominal pain, diarrhea, nausea and vomiting.  Genitourinary: Negative for difficulty urinating.  Musculoskeletal: Negative for back pain, neck pain and neck stiffness.  Skin: Negative for pallor.  Neurological: Negative for dizziness, speech difficulty, weakness and headaches.  Psychiatric/Behavioral: Negative for confusion.    Physical Exam Updated Vital Signs BP (!) 132/49   Pulse 88   Temp 98.3 F (36.8 C)   Resp 15   Ht '5\' 5"'  (1.651 m)    Wt (!) 164.7 kg   SpO2 95%   BMI 60.41 kg/m   Physical Exam Constitutional:      General: She is not in acute distress.    Appearance: Normal appearance. She is not ill-appearing, toxic-appearing or diaphoretic.     Comments: Patient does appear fluid overloaded, no acute distress, no respiratory distress.  HENT:     Mouth/Throat:     Mouth: Mucous membranes are moist.     Pharynx: Oropharynx is clear.  Eyes:     General: No scleral icterus.    Extraocular Movements: Extraocular movements intact.     Pupils: Pupils are equal, round, and reactive to light.  Cardiovascular:     Rate and Rhythm: Normal rate and regular rhythm.     Pulses: Normal pulses.     Heart sounds: Normal heart sounds.  Pulmonary:     Effort: Pulmonary effort is normal. No respiratory distress.     Breath sounds: Normal breath sounds. No stridor. No wheezing, rhonchi or rales.     Comments: Lungs clear Chest:     Chest wall: No tenderness.  Abdominal:     General: Abdomen is flat. There is no distension.     Palpations: Abdomen is soft.     Tenderness: There is no abdominal tenderness. There is no guarding or rebound.  Musculoskeletal:        General: No swelling or tenderness. Normal range of motion.     Cervical back: Normal range of motion and neck supple. No rigidity.     Right lower leg: Edema (1+ pitting edema to mid shin, no erythema or warmth, DPP 2+) present.     Left lower leg: Edema (Same as right) present.  Skin:    General: Skin is warm and dry.     Capillary Refill: Capillary refill takes less than 2 seconds.     Coloration: Skin is not pale.  Neurological:     General: No focal deficit present.  Mental Status: She is alert and oriented to person, place, and time.  Psychiatric:        Mood and Affect: Mood normal.        Behavior: Behavior normal.     ED Results / Procedures / Treatments   Labs (all labs ordered are listed, but only abnormal results are displayed) Labs Reviewed   COMPREHENSIVE METABOLIC PANEL - Abnormal; Notable for the following components:      Result Value   Chloride 97 (*)    CO2 35 (*)    Creatinine, Ser 1.27 (*)    GFR, Estimated 48 (*)    All other components within normal limits  CBC WITH DIFFERENTIAL/PLATELET  BRAIN NATRIURETIC PEPTIDE    EKG EKG Interpretation  Date/Time:  Thursday May 01 2021 12:59:21 EDT Ventricular Rate:  67 PR Interval:  180 QRS Duration: 116 QT Interval:  423 QTC Calculation: 447 R Axis:   59 Text Interpretation: Sinus rhythm Nonspecific intraventricular conduction delay Low voltage, precordial leads No significant change since prior 4/22 Confirmed by Aletta Edouard [phone]) on 05/01/2021 1:07:17 PM   Radiology DG Chest 2 View  Result Date: 05/01/2021 CLINICAL DATA:  Shortness of breath, swelling of feet for a few weeks, history of CHF. EXAM: CHEST - 2 VIEW COMPARISON:  April 05, 2021. FINDINGS: EKG leads project over the chest. Cervical spine fusion partially imaged as before. Cardiomediastinal contours are stable likely mild to moderately enlarged accentuated by portable technique. No sign of consolidation or evidence of pleural effusion. Limited assessment of lung bases due to overlapping redundant soft tissues of the upper abdomen and breasts. Lateral view without signs of effusion or considerable airspace process. On limited assessment no acute regional skeletal process. IMPRESSION: 1. Stable cardiomegaly. 2. No signs of edema or consolidation. Electronically Signed   By: Zetta Bills M.D.   On: 05/01/2021 13:41    Procedures Procedures   Medications Ordered in ED Medications  furosemide (LASIX) injection 40 mg (40 mg Intravenous Given 05/01/21 1609)  oxyCODONE (Oxy IR/ROXICODONE) immediate release tablet 10 mg (10 mg Oral Given 05/01/21 1637)    ED Course  I have reviewed the triage vital signs and the nursing notes.  Pertinent labs & imaging results that were available during my care of the  patient were reviewed by me and considered in my medical decision making (see chart for details).    MDM Rules/Calculators/A&P                           Polette Nofsinger Dowdle is a 65 y.o. female with past medical history of anxiety, CHF, hypertension that presents the emergency department today for weight gain, sent by PCP.  States that she spoke to her doctor on the phone today who told her to come to the ER.  Patient does appear fluid overloaded, however patient is not in any respiratory distress, is complaining of shortness of breath which has been chronic, no chest pain.  Will obtain basic labs and give IV torsemide once creatinine comes back.  Patient is nontoxic-appearing, no respiratory distress.  Patient does have edema in bilateral legs, 1+, states that has been constant over the past month, no changes, swelling does not go past the knees.  No concerns for cellulitis, patient is distally neurovascularly intact.  Area is not warm or tender, no tenderness to palpation, low likelihood for blood clot bilaterally especially since this is been going on for more than a month  without pain. [place] DVT 0. No concerns for proximal clot since swelling is not present in upper thighs.   Hemodynamically stable.  Chest x-ray interpreted without any signs of edema or acute cardiopulmonary disease.  EKG interpreted with any signs of ischemia. No concerns for atypical ACS, pt with chronic SOB with new weight gain.  Work-up today unremarkable, BNP 14.6.  Creatinine 1.27 which is not too far from patient's baseline.  Do not feel as if patient is in acute CHF, patient primarily here for 3 pounds of weight gain overnight.  Did give Lasix, creatinine appears to be at patient's baseline.  Patient is not complaining of any pain, upon ambulation patient's oxygen remained above 95%.  Patient will follow up with her cardiologist and primary care doctor tomorrow.  Discussed case with Dr. Tomi Bamberger who agrees with plan, patient be  discharged at this time.  Doubt need for further emergent work up at this time. I explained the diagnosis and have given explicit precautions to return to the ER including for any other new or worsening symptoms. The patient understands and accepts the medical plan as it's been dictated and I have answered their questions. Discharge instructions concerning home care and prescriptions have been given. The patient is STABLE and is discharged to home in good condition.  I discussed this case with my attending physician, Dr. Tomi Bamberger including patient's presenting symptoms, physical exam, and planned diagnostics and interventions. Attending physician stated agreement with plan or made changes to plan which were implemented.    Final Clinical Impression(s) / ED Diagnoses Final diagnoses:  Chronic congestive heart failure, unspecified heart failure type [facility])    Rx / DC Orders ED Discharge Orders    None       Alfredia Client, PA-C 05/01/21 1711    Hayden Rasmussen, MD 05/01/21 1924

## 2021-05-01 NOTE — ED Notes (Signed)
Patient ambulated in room with RN. Oxygen saturation was between 95-100%. HR between 78-82 bpm. Ambulated with steady gait

## 2021-05-01 NOTE — ED Notes (Signed)
Help get patient into a gown on the monitor placed patient on a external cath patient is resting with call bell in reach

## 2021-05-01 NOTE — Discharge Instructions (Addendum)
  You were evaluated in the Emergency Department and after careful evaluation, we did not find any emergent condition requiring admission or further testing in the hospital.   Your exam/testing today was overall reassuring.  Symptoms seem to be due to  CHF, however you are not in acute exacerbation.  I want you to continue using a heart failure eating plan and follow-up with your primary care and your heart doctor soon as you can.  Please connect to the emerge department if you develop new shortness of breath, chest pain, difficulty breathing.  Please also come back if the swelling in your legs become more red or hot, painful or if they extend past your knees. Please return to the Emergency Department if you experience any worsening of your condition.  Thank you for allowing [place] to be a part of your care. Please speak to your pharmacist about any new medications prescribed today in regards to side effects or interactions with other medications.

## 2021-05-01 NOTE — ED Notes (Signed)
Patient given discharge paperwork and instructions. Verbalized understanding of teaching. IV d/c with cath tip intact. Wheeled to exit in NAD.

## 2021-05-01 NOTE — Telephone Encounter (Signed)
    Christine from [place] calling to report patient going to ED today

## 2021-05-01 NOTE — ED Triage Notes (Signed)
PT BIB EMS. Pt woke up this morning and gained 3lbs and so PCP sent her to hospital. Pt has CHF and states she is fluid overloaded. Pts lung sounds are clear. VSS Pt is axox4. Pt on RA. Pt has +1 pitting edema bilateral legs.

## 2021-05-02 DIAGNOSIS — M542 Cervicalgia: Secondary | ICD-10-CM | POA: Diagnosis not present

## 2021-05-02 DIAGNOSIS — G990 Autonomic neuropathy in diseases classified elsewhere: Secondary | ICD-10-CM | POA: Diagnosis not present

## 2021-05-02 DIAGNOSIS — G894 Chronic pain syndrome: Secondary | ICD-10-CM | POA: Diagnosis not present

## 2021-05-02 DIAGNOSIS — Z79891 Long term (current) use of opiate analgesic: Secondary | ICD-10-CM | POA: Diagnosis not present

## 2021-05-02 DIAGNOSIS — G890 Central pain syndrome: Secondary | ICD-10-CM | POA: Diagnosis not present

## 2021-05-02 DIAGNOSIS — M1711 Unilateral primary osteoarthritis, right knee: Secondary | ICD-10-CM | POA: Diagnosis not present

## 2021-05-02 DIAGNOSIS — G8929 Other chronic pain: Secondary | ICD-10-CM | POA: Diagnosis not present

## 2021-05-02 DIAGNOSIS — J3089 Other allergic rhinitis: Secondary | ICD-10-CM | POA: Diagnosis not present

## 2021-05-02 DIAGNOSIS — J321 Chronic frontal sinusitis: Secondary | ICD-10-CM | POA: Diagnosis not present

## 2021-05-02 DIAGNOSIS — M25561 Pain in right knee: Secondary | ICD-10-CM | POA: Diagnosis not present

## 2021-05-02 DIAGNOSIS — M25552 Pain in left hip: Secondary | ICD-10-CM | POA: Diagnosis not present

## 2021-05-02 DIAGNOSIS — M25512 Pain in left shoulder: Secondary | ICD-10-CM | POA: Diagnosis not present

## 2021-05-05 ENCOUNTER — Telehealth: Payer: Self-pay | Admitting: Internal Medicine

## 2021-05-05 DIAGNOSIS — E785 Hyperlipidemia, unspecified: Secondary | ICD-10-CM | POA: Diagnosis not present

## 2021-05-05 DIAGNOSIS — I5032 Chronic diastolic (congestive) heart failure: Secondary | ICD-10-CM | POA: Diagnosis not present

## 2021-05-05 DIAGNOSIS — I872 Venous insufficiency (chronic) (peripheral): Secondary | ICD-10-CM | POA: Diagnosis not present

## 2021-05-05 DIAGNOSIS — K5900 Constipation, unspecified: Secondary | ICD-10-CM | POA: Diagnosis not present

## 2021-05-05 DIAGNOSIS — J441 Chronic obstructive pulmonary disease with (acute) exacerbation: Secondary | ICD-10-CM | POA: Diagnosis not present

## 2021-05-05 DIAGNOSIS — Z7984 Long term (current) use of oral hypoglycemic drugs: Secondary | ICD-10-CM | POA: Diagnosis not present

## 2021-05-05 DIAGNOSIS — E1151 Type 2 diabetes mellitus with diabetic peripheral angiopathy without gangrene: Secondary | ICD-10-CM | POA: Diagnosis not present

## 2021-05-05 DIAGNOSIS — M19011 Primary osteoarthritis, right shoulder: Secondary | ICD-10-CM | POA: Diagnosis not present

## 2021-05-05 DIAGNOSIS — E876 Hypokalemia: Secondary | ICD-10-CM | POA: Diagnosis not present

## 2021-05-05 DIAGNOSIS — I700 Atherosclerosis of aorta: Secondary | ICD-10-CM | POA: Diagnosis not present

## 2021-05-05 DIAGNOSIS — G8929 Other chronic pain: Secondary | ICD-10-CM | POA: Diagnosis not present

## 2021-05-05 DIAGNOSIS — I110 Hypertensive heart disease with heart failure: Secondary | ICD-10-CM | POA: Diagnosis not present

## 2021-05-05 DIAGNOSIS — Z9181 History of falling: Secondary | ICD-10-CM | POA: Diagnosis not present

## 2021-05-05 DIAGNOSIS — N179 Acute kidney failure, unspecified: Secondary | ICD-10-CM | POA: Diagnosis not present

## 2021-05-05 DIAGNOSIS — M545 Low back pain, unspecified: Secondary | ICD-10-CM | POA: Diagnosis not present

## 2021-05-05 NOTE — Telephone Encounter (Signed)
Maria [name] from [place] calling, states the patient has had a 9 pound weight gain she was 353 in the hospital on Thursday and now she is 362. The reason she went to the hospital was for the fluid build up. Wondering if it was okay for her to take lasix.  Stanton Kidney would like a call back [phone]

## 2021-05-06 DIAGNOSIS — I872 Venous insufficiency (chronic) (peripheral): Secondary | ICD-10-CM | POA: Diagnosis not present

## 2021-05-06 DIAGNOSIS — I5032 Chronic diastolic (congestive) heart failure: Secondary | ICD-10-CM | POA: Diagnosis not present

## 2021-05-06 DIAGNOSIS — M19011 Primary osteoarthritis, right shoulder: Secondary | ICD-10-CM | POA: Diagnosis not present

## 2021-05-06 DIAGNOSIS — I700 Atherosclerosis of aorta: Secondary | ICD-10-CM | POA: Diagnosis not present

## 2021-05-06 DIAGNOSIS — N179 Acute kidney failure, unspecified: Secondary | ICD-10-CM | POA: Diagnosis not present

## 2021-05-06 DIAGNOSIS — J441 Chronic obstructive pulmonary disease with (acute) exacerbation: Secondary | ICD-10-CM | POA: Diagnosis not present

## 2021-05-06 DIAGNOSIS — I110 Hypertensive heart disease with heart failure: Secondary | ICD-10-CM | POA: Diagnosis not present

## 2021-05-06 DIAGNOSIS — E1151 Type 2 diabetes mellitus with diabetic peripheral angiopathy without gangrene: Secondary | ICD-10-CM | POA: Diagnosis not present

## 2021-05-06 DIAGNOSIS — Z9181 History of falling: Secondary | ICD-10-CM | POA: Diagnosis not present

## 2021-05-06 DIAGNOSIS — K5900 Constipation, unspecified: Secondary | ICD-10-CM | POA: Diagnosis not present

## 2021-05-06 DIAGNOSIS — Z7984 Long term (current) use of oral hypoglycemic drugs: Secondary | ICD-10-CM | POA: Diagnosis not present

## 2021-05-06 DIAGNOSIS — G8929 Other chronic pain: Secondary | ICD-10-CM | POA: Diagnosis not present

## 2021-05-06 DIAGNOSIS — M545 Low back pain, unspecified: Secondary | ICD-10-CM | POA: Diagnosis not present

## 2021-05-06 DIAGNOSIS — E785 Hyperlipidemia, unspecified: Secondary | ICD-10-CM | POA: Diagnosis not present

## 2021-05-06 DIAGNOSIS — E876 Hypokalemia: Secondary | ICD-10-CM | POA: Diagnosis not present

## 2021-05-06 NOTE — Telephone Encounter (Signed)
Pt is not taking lasix; but should be taking torsemide  Please have pt make ROV

## 2021-05-07 DIAGNOSIS — I110 Hypertensive heart disease with heart failure: Secondary | ICD-10-CM | POA: Diagnosis not present

## 2021-05-07 DIAGNOSIS — J441 Chronic obstructive pulmonary disease with (acute) exacerbation: Secondary | ICD-10-CM | POA: Diagnosis not present

## 2021-05-07 DIAGNOSIS — M545 Low back pain, unspecified: Secondary | ICD-10-CM | POA: Diagnosis not present

## 2021-05-07 DIAGNOSIS — M19011 Primary osteoarthritis, right shoulder: Secondary | ICD-10-CM | POA: Diagnosis not present

## 2021-05-07 DIAGNOSIS — E785 Hyperlipidemia, unspecified: Secondary | ICD-10-CM | POA: Diagnosis not present

## 2021-05-07 DIAGNOSIS — E876 Hypokalemia: Secondary | ICD-10-CM | POA: Diagnosis not present

## 2021-05-07 DIAGNOSIS — I5032 Chronic diastolic (congestive) heart failure: Secondary | ICD-10-CM | POA: Diagnosis not present

## 2021-05-07 DIAGNOSIS — I700 Atherosclerosis of aorta: Secondary | ICD-10-CM | POA: Diagnosis not present

## 2021-05-07 DIAGNOSIS — Z9181 History of falling: Secondary | ICD-10-CM | POA: Diagnosis not present

## 2021-05-07 DIAGNOSIS — I872 Venous insufficiency (chronic) (peripheral): Secondary | ICD-10-CM | POA: Diagnosis not present

## 2021-05-07 DIAGNOSIS — E1151 Type 2 diabetes mellitus with diabetic peripheral angiopathy without gangrene: Secondary | ICD-10-CM | POA: Diagnosis not present

## 2021-05-07 DIAGNOSIS — N179 Acute kidney failure, unspecified: Secondary | ICD-10-CM | POA: Diagnosis not present

## 2021-05-07 DIAGNOSIS — K5900 Constipation, unspecified: Secondary | ICD-10-CM | POA: Diagnosis not present

## 2021-05-07 DIAGNOSIS — Z7984 Long term (current) use of oral hypoglycemic drugs: Secondary | ICD-10-CM | POA: Diagnosis not present

## 2021-05-07 DIAGNOSIS — G8929 Other chronic pain: Secondary | ICD-10-CM | POA: Diagnosis not present

## 2021-05-08 ENCOUNTER — Other Ambulatory Visit: Payer: Self-pay

## 2021-05-08 ENCOUNTER — Ambulatory Visit (INDEPENDENT_AMBULATORY_CARE_PROVIDER_SITE_OTHER): Payer: Medicare Other | Admitting: Internal Medicine

## 2021-05-08 ENCOUNTER — Encounter: Payer: Self-pay | Admitting: Internal Medicine

## 2021-05-08 VITALS — BP 120/62 | HR 71 | Temp 98.7°F | Ht 65.0 in | Wt 361.0 lb

## 2021-05-08 DIAGNOSIS — I5032 Chronic diastolic (congestive) heart failure: Secondary | ICD-10-CM

## 2021-05-08 DIAGNOSIS — M25561 Pain in right knee: Secondary | ICD-10-CM

## 2021-05-08 DIAGNOSIS — R7303 Prediabetes: Secondary | ICD-10-CM

## 2021-05-08 DIAGNOSIS — G8929 Other chronic pain: Secondary | ICD-10-CM | POA: Diagnosis not present

## 2021-05-08 NOTE — Patient Instructions (Addendum)
Please continue all other medications as before, and refills have been done if requested.  Please have the pharmacy call with any other refills you may need.  Please continue your efforts at being more active, low cholesterol diet, and weight control.  Please keep your appointments with your specialists as you may have planned - Cardiology in the AM  Please make an Appointment to return in 4 months

## 2021-05-08 NOTE — Telephone Encounter (Signed)
Patient is taking Torsemide and has a f/u appt today 05/08/21 at  2:20

## 2021-05-08 NOTE — Progress Notes (Deleted)
  Subjective:     Patient ID: Maria [name], female   DOB: 07/27/1956, 65 y.o.   MRN: [id number]  HPI   Review of Systems     Objective:   Physical Exam     Assessment:     ***    Plan:     ***

## 2021-05-08 NOTE — Progress Notes (Signed)
Virtual Visit via Video Note   This visit type was conducted due to national recommendations for restrictions regarding the COVID-19 Pandemic (e.g. social distancing) in an effort to limit this patient's exposure and mitigate transmission in our community.  Due to her co-morbid illnesses, this patient is at least at moderate risk for complications without adequate follow up.  This format is felt to be most appropriate for this patient at this time.  All issues noted in this document were discussed and addressed.  A limited physical exam was performed with this format.  Please refer to the patient's chart for her consent to telehealth for [facility].       Date:  05/09/2021   ID:  Maria [name] Agent, DOB 28-Jul-1956, MRN [id number] The patient was identified using 2 identifiers.  Patient Location: Home Provider Location: Office/Clinic   PCP:  Biagio Borg, MD   [facility] Cardiologist:  Minus Breeding, MD     Evaluation Performed:  Follow-Up Visit  Chief Complaint:    SOB  History of Present Illness:    Maria [name] is a 65 y.o. female with acute on chronic diastolic dysfunction. She was in the hospital in late April with abdominal pain.  We saw her for acute on chronic diastolic dysfunction.  She had some mild acute on chronic renal insufficiency.   We stopped her metolazone and switched Lasix to torsemide.  She was in the ED on 5/12 because she thought that she was volume overloaded and she had gained some weight.  However, BNP and CXR and exam did not suggest acute volume olverload.  I reviewed these records for this visit.    She is not ambulatory and cannot come in for appts.  We tried to get home health nursing.   Her last echocardiogram 03/08/21 showed EF 60-65%, G1DD, no RWMA, elevated LVEDP, and no significant valvular abnormalities. Does not appear to have had prior ischemic testing though does have evidence of prior coronary artery calcifications  (specifically LAD) noted on prior CTA Chest in 2020.   She said that she was gaining some weight and short of breath and she got IV diuretic and did lose weight.  Her breathing is back to baseline.  She is not having any new chest pressure, neck or arm discomfort.  She has Occupational Therapy who just stopped coming to the house.  She is having physical therapy.  She has home health nursing.  The patient does not have symptoms concerning for COVID-19 infection (fever, chills, cough, or new shortness of breath).    Past Medical History:  Diagnosis Date  . Acute lymphadenitis 2011  . ALLERGIC RHINITIS 08/10/2007  . ANXIETY 12/06/2007  . Atherosclerotic peripheral vascular disease ([place]) 06/13/2013   Aorta on CT June 2014  . Cellulitis 2011  . Cervical disc disease 03/09/2012  . CHF (congestive heart failure) ([place])   . Chronic pain 03/09/2012  . DEPRESSION 12/06/2007  . Diabetes ([place])   . DIVERTICULOSIS, [place] 12/06/2007  . GERD 12/06/2007  . Hepatitis    age 80 hepatitis A  . HLD (hyperlipidemia) 05/24/2019  . HYPERTENSION 12/06/2007  . Lumbar disc disease 03/09/2012  . Mesenteric adenitis   . MRSA 2006  . 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 THORACIC/LUMBOSACRAL NEURITIS/RADICULITIS UNSPEC 12/28/2008   Past Surgical History:  Procedure Laterality Date  . ABDOMINAL HYSTERECTOMY  1999   1 ovary left  . bloo clot removed from neck   may 25th , june 2. 2010  .  [place] SURGERY  may 24th 2010  . COLONOSCOPY WITH PROPOFOL N/A 07/10/2016   Procedure: COLONOSCOPY WITH PROPOFOL;  Surgeon: Manus Gunning, MD;  Location: [facility];  Service: Gastroenterology;  Laterality: N/A;  . COLONOSCOPY WITH PROPOFOL N/A 09/26/2019   Procedure: COLONOSCOPY WITH PROPOFOL;  Surgeon: Yetta Flock, MD;  Location: [facility];  Service: Gastroenterology;  Laterality: N/A;  . ESOPHAGOGASTRODUODENOSCOPY (EGD) WITH PROPOFOL N/A 07/10/2016   Procedure: ESOPHAGOGASTRODUODENOSCOPY (EGD)  WITH PROPOFOL;  Surgeon: Manus Gunning, MD;  Location: [facility];  Service: Gastroenterology;  Laterality: N/A;  . POLYPECTOMY  09/26/2019   Procedure: POLYPECTOMY;  Surgeon: Yetta Flock, MD;  Location: [facility];  Service: Gastroenterology;;  . s/p ovary cyst    . s/p right knee arthroscopy     Dr. Mardelle Matte ortho     Current Meds  Medication Sig  . Accu-Chek Softclix Lancets lancets Use as directed up to 4 times daily (Patient taking differently: 1 each by Other route in the morning, at noon, in the evening, and at bedtime.)  . acetaminophen (TYLENOL) 325 MG tablet Take 650 mg by mouth every 6 (six) hours as needed for headache (pain).  Marland Kitchen albuterol (PROAIR HFA) 108 (90 Base) MCG/ACT inhaler Inhale 2 puffs into the lungs every 6 (six) hours as needed for wheezing or shortness of breath.  . Blood Glucose Monitoring Suppl (ACCU-CHEK GUIDE) w/Device KIT Use as directed (Patient taking differently: 1 each by Other route as directed.)  . clonazePAM (KLONOPIN) 1 MG tablet TAKE 1 TABLET BY MOUTH 2 TIMES A DAY AS NEEDED FOR ANXIETY (Patient taking differently: Take 1 mg by mouth 2 (two) times daily as needed for anxiety.)  . cyclobenzaprine (FLEXERIL) 10 MG tablet TAKE 1 TABLET BY MOUTH 2 TIMES A DAY AS NEEDED FOR MUSCLE SPASMS (Patient taking differently: Take 10 mg by mouth 2 (two) times daily as needed for muscle spasms.)  . glucose blood (ACCU-CHEK GUIDE) test strip Use as instructed up to 4 times daily (Patient taking differently: Use as instructed up to 4 times daily)  . LINZESS 145 MCG CAPS capsule Take 145 mcg by mouth daily as needed (constipation).  Marland Kitchen loperamide (IMODIUM) 2 MG capsule Take 2 mg by mouth as needed for diarrhea or loose stools.  Marland Kitchen losartan (COZAAR) 100 MG tablet TAKE 1 TABLET BY MOUTH DAILY (Patient taking differently: Take 100 mg by mouth daily.)  . magnesium oxide (MAG-OX) 400 MG tablet Take 1 tablet (400 mg total) by mouth daily.  . metFORMIN (GLUCOPHAGE)  500 MG tablet Take 1 tablet (500 mg total) by mouth 2 (two) times daily with a meal.  . metoprolol tartrate (LOPRESSOR) 25 MG tablet Take 1 tablet (25 mg total) by mouth 2 (two) times daily.  Marland Kitchen NARCAN 4 MG/0.1ML LIQD nasal spray kit Place 0.4 mg into the nose daily as needed (opioid overdose).  . ondansetron (ZOFRAN ODT) 4 MG disintegrating tablet Take 1 tablet (4 mg total) by mouth every 4 (four) hours as needed for nausea or vomiting.  . Oxycodone HCl 10 MG TABS Take 1 tablet (10 mg total) 4 (four) times daily by mouth. Per [facility] (Patient taking differently: Take 10 mg by mouth every 4 (four) hours as needed (pain). Per [facility])  . pantoprazole (PROTONIX) 40 MG tablet TAKE ONE TABLET BY MOUTH DAILY (Patient taking differently: Take 40 mg by mouth daily.)  . polyethylene glycol powder (GLYCOLAX/MIRALAX) 17 GM/SCOOP powder Take 17 g by mouth daily as needed for  mild constipation.  . potassium chloride SA (KLOR-CON) 20 MEQ tablet Take 1 tablet (20 mEq total) by mouth 2 (two) times daily.  Marland Kitchen rOPINIRole (REQUIP) 3 MG tablet TAKE 1 TABLET BY MOUTH NIGHTLY AT BEDTIME AS NEEDED (Patient taking differently: Take 3 mg by mouth at bedtime as needed (RLS).)  . Simethicone (GAS-X EXTRA STRENGTH PO) Take 1 tablet by mouth daily as needed (gas).  . Tetrahydrozoline HCl (VISINE OP) Place 1 drop into both eyes daily as needed (dry eyes).  . torsemide (DEMADEX) 20 MG tablet Take 2 tablets (40 mg total) by mouth 2 (two) times daily.    Prior to Admission medications   Medication Sig Start Date End Date Taking? Authorizing Provider  Accu-Chek Softclix Lancets lancets Use as directed up to 4 times daily Patient taking differently: 1 each by Other route in the morning, at noon, in the evening, and at bedtime. 04/11/21  Yes Elodia Florence., MD  acetaminophen (TYLENOL) 325 MG tablet Take 650 mg by mouth every 6 (six) hours as needed for headache (pain).   Yes [provider]   albuterol (PROAIR HFA) 108 (90 Base) MCG/ACT inhaler Inhale 2 puffs into the lungs every 6 (six) hours as needed for wheezing or shortness of breath. 11/05/20  Yes Colin Benton R, DO  Blood Glucose Monitoring Suppl (ACCU-CHEK GUIDE) w/Device KIT Use as directed Patient taking differently: 1 each by Other route as directed. 04/11/21  Yes Elodia Florence., MD  clonazePAM (KLONOPIN) 1 MG tablet TAKE 1 TABLET BY MOUTH 2 TIMES A DAY AS NEEDED FOR ANXIETY Patient taking differently: Take 1 mg by mouth 2 (two) times daily as needed for anxiety. 12/10/20  Yes Biagio Borg, MD  cyclobenzaprine (FLEXERIL) 10 MG tablet TAKE 1 TABLET BY MOUTH 2 TIMES A DAY AS NEEDED FOR MUSCLE SPASMS Patient taking differently: Take 10 mg by mouth 2 (two) times daily as needed for muscle spasms. 02/27/21  Yes Biagio Borg, MD  glucose blood (ACCU-CHEK GUIDE) test strip Use as instructed up to 4 times daily Patient taking differently: Use as instructed up to 4 times daily 04/11/21  Yes Elodia Florence., MD  LINZESS 145 MCG CAPS capsule Take 145 mcg by mouth daily as needed (constipation). 08/15/20  Yes [provider]  loperamide (IMODIUM) 2 MG capsule Take 2 mg by mouth as needed for diarrhea or loose stools.   Yes [provider]  losartan (COZAAR) 100 MG tablet TAKE 1 TABLET BY MOUTH DAILY Patient taking differently: Take 100 mg by mouth daily. 08/06/20  Yes Biagio Borg, MD  magnesium oxide (MAG-OX) 400 MG tablet Take 1 tablet (400 mg total) by mouth daily. 04/12/21 05/12/21 Yes Elodia Florence., MD  metFORMIN (GLUCOPHAGE) 500 MG tablet Take 1 tablet (500 mg total) by mouth 2 (two) times daily with a meal. 03/11/21 05/10/21 Yes Bonnielee Haff, MD  metoprolol tartrate (LOPRESSOR) 25 MG tablet Take 1 tablet (25 mg total) by mouth 2 (two) times daily. 04/11/21 05/11/21 Yes Elodia Florence., MD  [facility] 4 MG/0.1ML LIQD nasal spray kit Place 0.4 mg into the nose daily as needed (opioid overdose).  04/25/19  Yes [provider]  ondansetron (ZOFRAN ODT) 4 MG disintegrating tablet Take 1 tablet (4 mg total) by mouth every 4 (four) hours as needed for nausea or vomiting. 08/23/19  Yes Charlesetta Shanks, MD  Oxycodone HCl 10 MG TABS Take 1 tablet (10 mg total) 4 (four) times daily by  mouth. Per [facility] Patient taking differently: Take 10 mg by mouth every 4 (four) hours as needed (pain). Per [facility] 11/08/17  Yes Biagio Borg, MD  pantoprazole (PROTONIX) 40 MG tablet TAKE ONE TABLET BY MOUTH DAILY Patient taking differently: Take 40 mg by mouth daily. 03/03/21  Yes Biagio Borg, MD  polyethylene glycol powder [facility]) 17 GM/SCOOP powder Take 17 g by mouth daily as needed for mild constipation. 04/11/21  Yes Elodia Florence., MD  potassium chloride SA (KLOR-CON) 20 MEQ tablet Take 1 tablet (20 mEq total) by mouth 2 (two) times daily. 04/11/21 05/11/21 Yes Elodia Florence., MD  rOPINIRole (REQUIP) 3 MG tablet TAKE 1 TABLET BY MOUTH NIGHTLY AT BEDTIME AS NEEDED Patient taking differently: Take 3 mg by mouth at bedtime as needed (RLS). 02/04/21  Yes Biagio Borg, MD  Simethicone (GAS-X EXTRA STRENGTH PO) Take 1 tablet by mouth daily as needed (gas).   Yes [provider]  Tetrahydrozoline HCl (VISINE OP) Place 1 drop into both eyes daily as needed (dry eyes).   Yes [provider]  torsemide (DEMADEX) 20 MG tablet Take 2 tablets (40 mg total) by mouth 2 (two) times daily. 04/11/21 05/11/21 Yes Elodia Florence., MD  DULoxetine (CYMBALTA) 60 MG capsule Take 60 mg by mouth daily.    03/09/12  [provider]  fexofenadine (ALLEGRA) 180 MG tablet Take 180 mg by mouth daily.    03/09/12  [provider]     Allergies:   Amoxicillin-pot clavulanate, Adhesive [tape], Codeine, Crestor [rosuvastatin calcium], Lactose intolerance (gi), Morphine and related, Vicodin [hydrocodone-acetaminophen], and Latex   Social History    Tobacco Use  . Smoking status: Former Smoker    Years: 30.00    Quit date: 11/08/2008    Years since quitting: 12.5  . Smokeless tobacco: Never Used  . Tobacco comment: quit 10/09  Vaping Use  . Vaping Use: Never used  Substance Use Topics  . Alcohol use: No    Comment: quit drinking 10/07/1997  . Drug use: No     Family Hx: The patient's family history includes Anxiety disorder in her sister; Asthma in her daughter and sister; Bipolar disorder in her daughter; Cancer in her maternal uncle and another family member; Depression in her sister; Diabetes in her mother; Heart disease in her mother; Heart failure in her mother; Hypertension in her father and mother; Liver cancer in her paternal grandmother.  ROS:   Please see the history of present illness.     All other systems reviewed and are negative.   Prior CV studies:   The following studies were reviewed today:  ED records  Labs/Other Tests and Data Reviewed:    EKG:  05/01/2021 sinus rhythm, rate 67, axis within normal limits, intervals within normal limits, borderline interventricular conduction delay  Recent Labs: 12/31/2020: TSH 1.45 04/11/2021: Magnesium 2.1 05/01/2021: ALT 22; B Natriuretic Peptide 14.6; BUN 20; Creatinine, Ser 1.27; Hemoglobin 12.4; Platelets 274; Potassium 3.5; Sodium 141   Recent Lipid Panel Lab Results  Component Value Date/Time   CHOL 156 12/31/2020 02:04 PM   TRIG 133.0 12/31/2020 02:04 PM   TRIG 126 12/31/2006 08:47 AM   HDL 46.90 12/31/2020 02:04 PM   CHOLHDL 3 12/31/2020 02:04 PM   LDLCALC 83 12/31/2020 02:04 PM   LDLDIRECT 127.0 01/25/2019 02:23 PM    Wt Readings from Last 3 Encounters:  05/09/21 (!) 361 lb (163.7 kg)  05/08/21 (!) 361  lb (163.7 kg)  05/01/21 (!) 363 lb (164.7 kg)     Risk Assessment/Calculations:      Objective:    Vital Signs:  BP (!) 128/51   Pulse 72   Ht '5\' 5"'  (1.651 m)   Wt (!) 361 lb (163.7 kg)   SpO2 94%   BMI 60.07 kg/m    VITAL SIGNS:   reviewed GEN:  no acute distress EYES:  sclerae anicteric, EOMI - Extraocular Movements Intact NEURO:  alert and oriented x 3, no obvious focal deficit PSYCH:  normal affect  ASSESSMENT & PLAN:      Acute diastolic CHF:  Today she is symptomatically doing well.    We talked about as needed dosing of her torsemide.  If she takes an extra torsemide because her potassium is borderline I like her to take an extra 220 mEq tablets.  She would let me know if she has to do this frequently.  Otherwise meds will remain as listed.    Chest pressure: We talked about this at length and she has no further chest discomfort.  No change in therapy.  I do not think further testing is indicated.  Enzymes were negative in the hospital and her symptoms were nonanginal.   HTN:  Blood pressure is well controlled.  She will continue the meds as listed.  HLD:  LDL was 83 with an HDL of 46.9.  No change in therapy. te weight loss  DM type 2: A1c was 6.5.  No change in therapy.  Morbid obesity: We have talked about this at length.  No change in therapy.     Time:   Today, I have spent 24 minutes with the patient with telehealth technology discussing the above problems.     Medication Adjustments/Labs and Tests Ordered: Current medicines are reviewed at length with the patient today.  Concerns regarding medicines are outlined above.   Tests Ordered: No orders of the defined types were placed in this encounter.   Medication Changes: No orders of the defined types were placed in this encounter.   Follow Up:  Virtual Visit  In 3 months  Signed, Minus Breeding, MD  05/09/2021 12:55 PM     [facility]

## 2021-05-08 NOTE — Progress Notes (Signed)
Patient ID: Maria [name], female   DOB: 06/23/1956, 65 y.o.   MRN: [id number]        Chief Complaint: f/u ED visit       HPI:  Maria [name] is a 65 y.o. female here after ED visit recommended going there by home nurse who noted 3 lb wt gain, though pt had no symptoms.  Pt denies chest pain, increased sob or doe, wheezing, orthopnea, PND, palpitations, dizziness or syncope and denies worsening leg swelling though has chronic swelling with lymphedema as well.  In addition, pt has right knee pain and acute swelling in the past wk that may be contributing to leg swelling as well.  No giveaways or falls.  Pt tearful in her chronic persistent state very difficult to manage  Does plan to f/u with Dr Mardelle Matte ortho at [facility].   Pt denies polydipsia, polyuria, or new focal neuro s/s.   Pt denies fever, wt loss, night sweats, loss of appetite, or other constitutional symptoms  No other new complaints       Wt Readings from Last 3 Encounters:  05/09/21 (!) 361 lb (163.7 kg)  05/08/21 (!) 361 lb (163.7 kg)  05/01/21 (!) 363 lb (164.7 kg)   BP Readings from Last 3 Encounters:  05/09/21 (!) 128/51  05/08/21 120/62  05/01/21 (!) 132/49         Past Medical History:  Diagnosis Date  . Acute lymphadenitis 2011  . ALLERGIC RHINITIS 08/10/2007  . ANXIETY 12/06/2007  . Atherosclerotic peripheral vascular disease ([place]) 06/13/2013   Aorta on CT June 2014  . Cellulitis 2011  . Cervical disc disease 03/09/2012  . CHF (congestive heart failure) ([place])   . Chronic pain 03/09/2012  . DEPRESSION 12/06/2007  . Diabetes ([place])   . DIVERTICULOSIS, [place] 12/06/2007  . GERD 12/06/2007  . Hepatitis    age 7 hepatitis A  . HLD (hyperlipidemia) 05/24/2019  . HYPERTENSION 12/06/2007  . Lumbar disc disease 03/09/2012  . Mesenteric adenitis   . MRSA 2006  . 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 THORACIC/LUMBOSACRAL NEURITIS/RADICULITIS UNSPEC 12/28/2008   Past Surgical History:  Procedure Laterality Date   . ABDOMINAL HYSTERECTOMY  1999   1 ovary left  . bloo clot removed from neck   may 25th , june 2. 2010  . [place] SURGERY  may 24th 2010  . COLONOSCOPY WITH PROPOFOL N/A 07/10/2016   Procedure: COLONOSCOPY WITH PROPOFOL;  Surgeon: Manus Gunning, MD;  Location: [facility];  Service: Gastroenterology;  Laterality: N/A;  . COLONOSCOPY WITH PROPOFOL N/A 09/26/2019   Procedure: COLONOSCOPY WITH PROPOFOL;  Surgeon: Yetta Flock, MD;  Location: [facility];  Service: Gastroenterology;  Laterality: N/A;  . ESOPHAGOGASTRODUODENOSCOPY (EGD) WITH PROPOFOL N/A 07/10/2016   Procedure: ESOPHAGOGASTRODUODENOSCOPY (EGD) WITH PROPOFOL;  Surgeon: Manus Gunning, MD;  Location: [facility];  Service: Gastroenterology;  Laterality: N/A;  . POLYPECTOMY  09/26/2019   Procedure: POLYPECTOMY;  Surgeon: Yetta Flock, MD;  Location: [facility];  Service: Gastroenterology;;  . s/p ovary cyst    . s/p right knee arthroscopy     Dr. Mardelle Matte ortho    reports that she quit smoking about 12 years ago. She quit after 30.00 years of use. She has never used smokeless tobacco. She reports that she does not drink alcohol and does not use drugs. family history includes Anxiety disorder in her sister; Asthma in her daughter and sister; Bipolar disorder in her daughter; Cancer in her maternal uncle and  another family member; Depression in her sister; Diabetes in her mother; Heart disease in her mother; Heart failure in her mother; Hypertension in her father and mother; Liver cancer in her paternal grandmother. Allergies  Allergen Reactions  . Amoxicillin-Pot Clavulanate Nausea And Vomiting    Projectile vomiting Did it involve swelling of the face/tongue/throat, SOB, or low BP? No Did it involve sudden or severe rash/hives, skin peeling, or any reaction on the inside of your mouth or nose? No Did you need to seek medical attention at a hospital or doctor's office? No When did it last  happen?20-30 years If all above answers are "NO", may proceed with cephalosporin use.   . Adhesive [Tape] Other (See Comments)    Tears skin off - use paper tape   . Codeine Other (See Comments)    hallucinations  . Crestor [Rosuvastatin Calcium]     Severe muscle cramps  . Lactose Intolerance (Gi) Nausea And Vomiting  . Morphine And Related Nausea And Vomiting and Other (See Comments)    Migraine headaches  . Vicodin [Hydrocodone-Acetaminophen] Other (See Comments)    hallucinations  . Latex Rash   Current Outpatient Medications on File Prior to Visit  Medication Sig Dispense Refill  . Accu-Chek Softclix Lancets lancets Use as directed up to 4 times daily (Patient taking differently: 1 each by Other route in the morning, at noon, in the evening, and at bedtime.) 100 each 5  . acetaminophen (TYLENOL) 325 MG tablet Take 650 mg by mouth every 6 (six) hours as needed for headache (pain).    Marland Kitchen albuterol (PROAIR HFA) 108 (90 Base) MCG/ACT inhaler Inhale 2 puffs into the lungs every 6 (six) hours as needed for wheezing or shortness of breath. 1 each 0  . Blood Glucose Monitoring Suppl (ACCU-CHEK GUIDE) w/Device KIT Use as directed (Patient taking differently: 1 each by Other route as directed.) 1 kit 0  . clonazePAM (KLONOPIN) 1 MG tablet TAKE 1 TABLET BY MOUTH 2 TIMES A DAY AS NEEDED FOR ANXIETY (Patient taking differently: Take 1 mg by mouth 2 (two) times daily as needed for anxiety.) 60 tablet 5  . cyclobenzaprine (FLEXERIL) 10 MG tablet TAKE 1 TABLET BY MOUTH 2 TIMES A DAY AS NEEDED FOR MUSCLE SPASMS (Patient taking differently: Take 10 mg by mouth 2 (two) times daily as needed for muscle spasms.) 180 tablet 1  . glucose blood (ACCU-CHEK GUIDE) test strip Use as instructed up to 4 times daily (Patient taking differently: Use as instructed up to 4 times daily) 100 each 12  . LINZESS 145 MCG CAPS capsule Take 145 mcg by mouth daily as needed (constipation).    Marland Kitchen loperamide (IMODIUM) 2 MG  capsule Take 2 mg by mouth as needed for diarrhea or loose stools.    Marland Kitchen losartan (COZAAR) 100 MG tablet TAKE 1 TABLET BY MOUTH DAILY (Patient taking differently: Take 100 mg by mouth daily.) 90 tablet 1  . magnesium oxide (MAG-OX) 400 MG tablet Take 1 tablet (400 mg total) by mouth daily. 30 tablet 0  . metFORMIN (GLUCOPHAGE) 500 MG tablet Take 1 tablet (500 mg total) by mouth 2 (two) times daily with a meal. 60 tablet 1  . NARCAN 4 MG/0.1ML LIQD nasal spray kit Place 0.4 mg into the nose daily as needed (opioid overdose).    . ondansetron (ZOFRAN ODT) 4 MG disintegrating tablet Take 1 tablet (4 mg total) by mouth every 4 (four) hours as needed for nausea or vomiting. 20 tablet 0  .  Oxycodone HCl 10 MG TABS Take 1 tablet (10 mg total) 4 (four) times daily by mouth. Per [facility] (Patient taking differently: Take 10 mg by mouth every 4 (four) hours as needed (pain). Per [facility]) 30 tablet 0  . pantoprazole (PROTONIX) 40 MG tablet TAKE ONE TABLET BY MOUTH DAILY (Patient taking differently: Take 40 mg by mouth daily.) 90 tablet 2  . polyethylene glycol powder (GLYCOLAX/MIRALAX) 17 GM/SCOOP powder Take 17 g by mouth daily as needed for mild constipation. 510 g 0  . potassium chloride SA (KLOR-CON) 20 MEQ tablet Take 1 tablet (20 mEq total) by mouth 2 (two) times daily. 60 tablet 0  . rOPINIRole (REQUIP) 3 MG tablet TAKE 1 TABLET BY MOUTH NIGHTLY AT BEDTIME AS NEEDED (Patient taking differently: Take 3 mg by mouth at bedtime as needed (RLS).) 30 tablet 5  . Simethicone (GAS-X EXTRA STRENGTH PO) Take 1 tablet by mouth daily as needed (gas).    . Tetrahydrozoline HCl (VISINE OP) Place 1 drop into both eyes daily as needed (dry eyes).    . [DISCONTINUED] DULoxetine (CYMBALTA) 60 MG capsule Take 60 mg by mouth daily.      . [DISCONTINUED] fexofenadine (ALLEGRA) 180 MG tablet Take 180 mg by mouth daily.       No current facility-administered medications on file prior to visit.         ROS:  All others reviewed and negative.  Objective        PE:  BP 120/62 (BP Location: Left Arm, Patient Position: Sitting, Cuff Size: Large)   Pulse 71   Temp 98.7 F (37.1 C) (Oral)   Ht '5\' 5"'  (1.651 m)   Wt (!) 361 lb (163.7 kg)   SpO2 96%   BMI 60.07 kg/m                 Constitutional: Pt appears in NAD               HENT: Head: NCAT.                Right Ear: External ear normal.                 Left Ear: External ear normal.                Eyes: . Pupils are equal, round, and reactive to light. Conjunctivae and EOM are normal               Nose: without d/c or deformity               Neck: Neck supple. Gross normal ROM               Cardiovascular: Normal rate and regular rhythm.                 Pulmonary/Chest: Effort normal and breath sounds without rales or wheezing.                Abd:  Soft, NT, ND, + BS, no organomegaly               Neurological: Pt is alert. At baseline orientation, motor grossly intact               Skin: Skin is warm. No rashes, no other new lesions, LE edema - chornic 1-2+ bilat               Psychiatric: Pt behavior is normal without agitation  Micro: none  Cardiac tracings I have personally interpreted today:  none  Pertinent Radiological findings (summarize): none   Lab Results  Component Value Date   WBC 7.0 05/01/2021   HGB 12.4 05/01/2021   HCT 37.2 05/01/2021   PLT 274 05/01/2021   GLUCOSE 90 05/01/2021   CHOL 156 12/31/2020   TRIG 133.0 12/31/2020   HDL 46.90 12/31/2020   LDLDIRECT 127.0 01/25/2019   LDLCALC 83 12/31/2020   ALT 22 05/01/2021   AST 23 05/01/2021   NA 141 05/01/2021   K 3.5 05/01/2021   CL 97 (L) 05/01/2021   CREATININE 1.27 (H) 05/01/2021   BUN 20 05/01/2021   CO2 35 (H) 05/01/2021   TSH 1.45 12/31/2020   INR 1.0 04/01/2021   HGBA1C 6.5 (H) 03/07/2021   Assessment/Plan:  Maria [name] is a 65 y.o. White or Caucasian [1] female with  has a past medical history of Acute lymphadenitis (2011),  ALLERGIC RHINITIS (08/10/2007), ANXIETY (12/06/2007), Atherosclerotic peripheral vascular disease ([place]) (06/13/2013), Cellulitis (2011), Cervical disc disease (03/09/2012), CHF (congestive heart failure) ([place]), Chronic pain (03/09/2012), DEPRESSION (12/06/2007), Diabetes ([employer]), DIVERTICULOSIS, [place] (12/06/2007), GERD (12/06/2007), Hepatitis, HLD (hyperlipidemia) (05/24/2019), HYPERTENSION (12/06/2007), Lumbar disc disease (03/09/2012), Mesenteric adenitis, MRSA (2006), 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[place]) (11/08/2017), and THORACIC/LUMBOSACRAL NEURITIS/RADICULITIS UNSPEC (12/28/2008).  Diastolic congestive heart failure (HCC) Overall stable it seems, and pt prefers to wait for cardiology virtual visit in AM for any further advice on changes in tx,  to f/u any worsening symptoms or concerns  Right knee pain With mild acute pain and swelling - for f/u ortho as pt prefers,  to f/u any worsening symptoms or concerns  Prediabetes Lab Results  Component Value Date   HGBA1C 6.5 (H) 03/07/2021   Stable, pt to continue current medical treatment  - metformin   Followup: Return in about 4 months (around 09/08/2021).  Cathlean Cower, MD 05/11/2021 1:34 PM [facility]

## 2021-05-09 ENCOUNTER — Encounter: Payer: Self-pay | Admitting: Cardiology

## 2021-05-09 ENCOUNTER — Telehealth (INDEPENDENT_AMBULATORY_CARE_PROVIDER_SITE_OTHER): Payer: Medicare Other | Admitting: Cardiology

## 2021-05-09 VITALS — BP 128/51 | HR 72 | Ht 65.0 in | Wt 361.0 lb

## 2021-05-09 DIAGNOSIS — G8929 Other chronic pain: Secondary | ICD-10-CM | POA: Diagnosis not present

## 2021-05-09 DIAGNOSIS — M545 Low back pain, unspecified: Secondary | ICD-10-CM | POA: Diagnosis not present

## 2021-05-09 DIAGNOSIS — K5900 Constipation, unspecified: Secondary | ICD-10-CM | POA: Diagnosis not present

## 2021-05-09 DIAGNOSIS — I10 Essential (primary) hypertension: Secondary | ICD-10-CM | POA: Diagnosis not present

## 2021-05-09 DIAGNOSIS — E118 Type 2 diabetes mellitus with unspecified complications: Secondary | ICD-10-CM | POA: Diagnosis not present

## 2021-05-09 DIAGNOSIS — R079 Chest pain, unspecified: Secondary | ICD-10-CM

## 2021-05-09 DIAGNOSIS — E785 Hyperlipidemia, unspecified: Secondary | ICD-10-CM | POA: Diagnosis not present

## 2021-05-09 DIAGNOSIS — Z7984 Long term (current) use of oral hypoglycemic drugs: Secondary | ICD-10-CM | POA: Diagnosis not present

## 2021-05-09 DIAGNOSIS — I110 Hypertensive heart disease with heart failure: Secondary | ICD-10-CM | POA: Diagnosis not present

## 2021-05-09 DIAGNOSIS — Z9181 History of falling: Secondary | ICD-10-CM | POA: Diagnosis not present

## 2021-05-09 DIAGNOSIS — E1151 Type 2 diabetes mellitus with diabetic peripheral angiopathy without gangrene: Secondary | ICD-10-CM | POA: Diagnosis not present

## 2021-05-09 DIAGNOSIS — I5032 Chronic diastolic (congestive) heart failure: Secondary | ICD-10-CM

## 2021-05-09 DIAGNOSIS — I700 Atherosclerosis of aorta: Secondary | ICD-10-CM | POA: Diagnosis not present

## 2021-05-09 DIAGNOSIS — J441 Chronic obstructive pulmonary disease with (acute) exacerbation: Secondary | ICD-10-CM | POA: Diagnosis not present

## 2021-05-09 DIAGNOSIS — M19011 Primary osteoarthritis, right shoulder: Secondary | ICD-10-CM | POA: Diagnosis not present

## 2021-05-09 DIAGNOSIS — N179 Acute kidney failure, unspecified: Secondary | ICD-10-CM | POA: Diagnosis not present

## 2021-05-09 DIAGNOSIS — I872 Venous insufficiency (chronic) (peripheral): Secondary | ICD-10-CM | POA: Diagnosis not present

## 2021-05-09 DIAGNOSIS — E876 Hypokalemia: Secondary | ICD-10-CM | POA: Diagnosis not present

## 2021-05-09 MED ORDER — METOPROLOL TARTRATE 25 MG PO TABS: 25.0000 mg | ORAL_TABLET | Freq: Two times a day (BID) | ORAL | 3 refills | Status: DC

## 2021-05-09 MED ORDER — TORSEMIDE 20 MG PO TABS: 40.0000 mg | ORAL_TABLET | Freq: Two times a day (BID) | ORAL | 1 refills | Status: DC

## 2021-05-09 NOTE — Patient Instructions (Addendum)
Medication Instructions:  Your physician recommends that you continue on your current medications as directed. Please refer to the Current Medication list given to you today.   Lab Work: NONE   Testing/Procedures: NONE  Follow-Up: At [facility], you and your health needs are our priority.  As part of our continuing mission to provide you with exceptional heart care, we have created designated Provider Care Teams.  These Care Teams include your primary Cardiologist (physician) and Advanced Practice Providers (APPs -  Physician Assistants and Nurse Practitioners) who all work together to provide you with the care you need, when you need it.  We recommend signing up for the patient portal called "MyChart".  Sign up information is provided on this After Visit Summary.  MyChart is used to connect with patients for Virtual Visits (Telemedicine).  Patients are able to view lab/test results, encounter notes, upcoming appointments, etc.  Non-urgent messages can be sent to your provider as well.   To learn more about what you can do with MyChart, go to [email or web address].    Your next appointment:    [facility] VISIT 08/18/2021 AT 11:45 WITH JESSE C NP

## 2021-05-11 ENCOUNTER — Encounter: Payer: Self-pay | Admitting: Internal Medicine

## 2021-05-11 ENCOUNTER — Other Ambulatory Visit: Payer: Self-pay | Admitting: Internal Medicine

## 2021-05-11 NOTE — Assessment & Plan Note (Signed)
Lab Results  Component Value Date   HGBA1C 6.5 (H) 03/07/2021   Stable, pt to continue current medical treatment - metformin  

## 2021-05-11 NOTE — Telephone Encounter (Signed)
Please refill as per office routine med refill policy (all routine meds refilled for 3 mo or monthly per pt preference up to one year from last visit, then month to month grace period for 3 mo, then further med refills will have to be denied)  

## 2021-05-11 NOTE — Assessment & Plan Note (Signed)
With mild acute pain and swelling - for f/u ortho as pt prefers,  to f/u any worsening symptoms or concerns

## 2021-05-11 NOTE — Assessment & Plan Note (Signed)
Overall stable it seems, and pt prefers to wait for cardiology virtual visit in AM for any further advice on changes in tx,  to f/u any worsening symptoms or concerns

## 2021-05-12 ENCOUNTER — Telehealth: Payer: Self-pay | Admitting: Internal Medicine

## 2021-05-12 DIAGNOSIS — M545 Low back pain, unspecified: Secondary | ICD-10-CM | POA: Diagnosis not present

## 2021-05-12 DIAGNOSIS — J441 Chronic obstructive pulmonary disease with (acute) exacerbation: Secondary | ICD-10-CM | POA: Diagnosis not present

## 2021-05-12 DIAGNOSIS — E876 Hypokalemia: Secondary | ICD-10-CM | POA: Diagnosis not present

## 2021-05-12 DIAGNOSIS — K5900 Constipation, unspecified: Secondary | ICD-10-CM | POA: Diagnosis not present

## 2021-05-12 DIAGNOSIS — E1151 Type 2 diabetes mellitus with diabetic peripheral angiopathy without gangrene: Secondary | ICD-10-CM | POA: Diagnosis not present

## 2021-05-12 DIAGNOSIS — E785 Hyperlipidemia, unspecified: Secondary | ICD-10-CM | POA: Diagnosis not present

## 2021-05-12 DIAGNOSIS — I700 Atherosclerosis of aorta: Secondary | ICD-10-CM | POA: Diagnosis not present

## 2021-05-12 DIAGNOSIS — N179 Acute kidney failure, unspecified: Secondary | ICD-10-CM | POA: Diagnosis not present

## 2021-05-12 DIAGNOSIS — I110 Hypertensive heart disease with heart failure: Secondary | ICD-10-CM | POA: Diagnosis not present

## 2021-05-12 DIAGNOSIS — I872 Venous insufficiency (chronic) (peripheral): Secondary | ICD-10-CM | POA: Diagnosis not present

## 2021-05-12 DIAGNOSIS — Z9181 History of falling: Secondary | ICD-10-CM | POA: Diagnosis not present

## 2021-05-12 DIAGNOSIS — I5032 Chronic diastolic (congestive) heart failure: Secondary | ICD-10-CM | POA: Diagnosis not present

## 2021-05-12 DIAGNOSIS — Z7984 Long term (current) use of oral hypoglycemic drugs: Secondary | ICD-10-CM | POA: Diagnosis not present

## 2021-05-12 DIAGNOSIS — G8929 Other chronic pain: Secondary | ICD-10-CM | POA: Diagnosis not present

## 2021-05-12 DIAGNOSIS — M19011 Primary osteoarthritis, right shoulder: Secondary | ICD-10-CM | POA: Diagnosis not present

## 2021-05-12 NOTE — Telephone Encounter (Signed)
1.Medication Requested: metFORMIN (GLUCOPHAGE) 500 MG tablet    2. Pharmacy (Name, Street, [place]): [facility] [address]  3. On Med List: yes   4. Last Visit with PCP: 05-08-21  5. Next visit date with PCP: n/a   Patient is requesting a 90 day supply    Agent: Please be advised that RX refills may take up to 3 business days. We ask that you follow-up with your pharmacy.

## 2021-05-13 ENCOUNTER — Ambulatory Visit: Payer: Medicare Other | Admitting: Cardiovascular Disease

## 2021-05-13 DIAGNOSIS — E785 Hyperlipidemia, unspecified: Secondary | ICD-10-CM | POA: Diagnosis not present

## 2021-05-13 DIAGNOSIS — N179 Acute kidney failure, unspecified: Secondary | ICD-10-CM | POA: Diagnosis not present

## 2021-05-13 DIAGNOSIS — G8929 Other chronic pain: Secondary | ICD-10-CM | POA: Diagnosis not present

## 2021-05-13 DIAGNOSIS — E1151 Type 2 diabetes mellitus with diabetic peripheral angiopathy without gangrene: Secondary | ICD-10-CM | POA: Diagnosis not present

## 2021-05-13 DIAGNOSIS — E876 Hypokalemia: Secondary | ICD-10-CM | POA: Diagnosis not present

## 2021-05-13 DIAGNOSIS — K5900 Constipation, unspecified: Secondary | ICD-10-CM | POA: Diagnosis not present

## 2021-05-13 DIAGNOSIS — Z9181 History of falling: Secondary | ICD-10-CM | POA: Diagnosis not present

## 2021-05-13 DIAGNOSIS — Z7984 Long term (current) use of oral hypoglycemic drugs: Secondary | ICD-10-CM | POA: Diagnosis not present

## 2021-05-13 DIAGNOSIS — I110 Hypertensive heart disease with heart failure: Secondary | ICD-10-CM | POA: Diagnosis not present

## 2021-05-13 DIAGNOSIS — M19011 Primary osteoarthritis, right shoulder: Secondary | ICD-10-CM | POA: Diagnosis not present

## 2021-05-13 DIAGNOSIS — I5032 Chronic diastolic (congestive) heart failure: Secondary | ICD-10-CM | POA: Diagnosis not present

## 2021-05-13 DIAGNOSIS — I700 Atherosclerosis of aorta: Secondary | ICD-10-CM | POA: Diagnosis not present

## 2021-05-13 DIAGNOSIS — I872 Venous insufficiency (chronic) (peripheral): Secondary | ICD-10-CM | POA: Diagnosis not present

## 2021-05-13 DIAGNOSIS — M545 Low back pain, unspecified: Secondary | ICD-10-CM | POA: Diagnosis not present

## 2021-05-13 DIAGNOSIS — J441 Chronic obstructive pulmonary disease with (acute) exacerbation: Secondary | ICD-10-CM | POA: Diagnosis not present

## 2021-05-13 MED ORDER — METFORMIN HCL 500 MG PO TABS: 500.0000 mg | ORAL_TABLET | Freq: Two times a day (BID) | ORAL | 1 refills | Status: DC

## 2021-05-13 NOTE — Telephone Encounter (Signed)
Ok sure  Please refill as per office routine med refill policy (all routine meds refilled for 3 mo or monthly per pt preference up to one year from last visit, then month to month grace period for 3 mo, then further med refills will have to be denied)

## 2021-05-14 LAB — HM DIABETES EYE EXAM

## 2021-05-15 ENCOUNTER — Telehealth: Payer: Self-pay | Admitting: *Deleted

## 2021-05-15 ENCOUNTER — Ambulatory Visit: Payer: Medicare Other | Admitting: *Deleted

## 2021-05-15 ENCOUNTER — Encounter: Payer: Self-pay | Admitting: *Deleted

## 2021-05-15 DIAGNOSIS — M19011 Primary osteoarthritis, right shoulder: Secondary | ICD-10-CM | POA: Diagnosis not present

## 2021-05-15 DIAGNOSIS — Z7984 Long term (current) use of oral hypoglycemic drugs: Secondary | ICD-10-CM | POA: Diagnosis not present

## 2021-05-15 DIAGNOSIS — E1151 Type 2 diabetes mellitus with diabetic peripheral angiopathy without gangrene: Secondary | ICD-10-CM | POA: Diagnosis not present

## 2021-05-15 DIAGNOSIS — E785 Hyperlipidemia, unspecified: Secondary | ICD-10-CM | POA: Diagnosis not present

## 2021-05-15 DIAGNOSIS — I700 Atherosclerosis of aorta: Secondary | ICD-10-CM | POA: Diagnosis not present

## 2021-05-15 DIAGNOSIS — Z9181 History of falling: Secondary | ICD-10-CM | POA: Diagnosis not present

## 2021-05-15 DIAGNOSIS — I110 Hypertensive heart disease with heart failure: Secondary | ICD-10-CM | POA: Diagnosis not present

## 2021-05-15 DIAGNOSIS — I5032 Chronic diastolic (congestive) heart failure: Secondary | ICD-10-CM | POA: Diagnosis not present

## 2021-05-15 DIAGNOSIS — I5030 Unspecified diastolic (congestive) heart failure: Secondary | ICD-10-CM | POA: Diagnosis not present

## 2021-05-15 DIAGNOSIS — N179 Acute kidney failure, unspecified: Secondary | ICD-10-CM | POA: Diagnosis not present

## 2021-05-15 DIAGNOSIS — I872 Venous insufficiency (chronic) (peripheral): Secondary | ICD-10-CM | POA: Diagnosis not present

## 2021-05-15 DIAGNOSIS — K5900 Constipation, unspecified: Secondary | ICD-10-CM | POA: Diagnosis not present

## 2021-05-15 DIAGNOSIS — J441 Chronic obstructive pulmonary disease with (acute) exacerbation: Secondary | ICD-10-CM | POA: Diagnosis not present

## 2021-05-15 DIAGNOSIS — E1169 Type 2 diabetes mellitus with other specified complication: Secondary | ICD-10-CM | POA: Diagnosis not present

## 2021-05-15 DIAGNOSIS — M545 Low back pain, unspecified: Secondary | ICD-10-CM | POA: Diagnosis not present

## 2021-05-15 DIAGNOSIS — G8929 Other chronic pain: Secondary | ICD-10-CM | POA: Diagnosis not present

## 2021-05-15 DIAGNOSIS — E876 Hypokalemia: Secondary | ICD-10-CM | POA: Diagnosis not present

## 2021-05-15 NOTE — Chronic Care Management (AMB) (Signed)
Chronic Care Management   CCM RN Visit Note  05/15/2021 Name: Maria [name] MRN: [id number] DOB: Jun 11, 1956  Subjective: Maria [name] is a 65 y.o. year old female who is a primary care patient of Biagio Borg, MD. The care management team was consulted for assistance with disease management and care coordination needs.    Engaged with patient by telephone for follow up visit in response to provider referral for case management and/or care coordination services.   Consent to Services:  The patient was given information about Chronic Care Management services, agreed to services, and gave verbal consent prior to initiation of services.  Please see initial visit note for detailed documentation.  Patient agreed to services and verbal consent obtained.   Assessment: Review of patient past medical history, allergies, medications, health status, including review of consultants reports, laboratory and other test data, was performed as part of comprehensive evaluation and provision of chronic care management services.   CCM Care Plan  Allergies  Allergen Reactions  . Amoxicillin-Pot Clavulanate Nausea And Vomiting    Projectile vomiting Did it involve swelling of the face/tongue/throat, SOB, or low BP? No Did it involve sudden or severe rash/hives, skin peeling, or any reaction on the inside of your mouth or nose? No Did you need to seek medical attention at a hospital or doctor's office? No When did it last happen?20-30 years If all above answers are "NO", may proceed with cephalosporin use.   . Adhesive [Tape] Other (See Comments)    Tears skin off - use paper tape   . Codeine Other (See Comments)    hallucinations  . Crestor [Rosuvastatin Calcium]     Severe muscle cramps  . Lactose Intolerance (Gi) Nausea And Vomiting  . Morphine And Related Nausea And Vomiting and Other (See Comments)    Migraine headaches  . Vicodin [Hydrocodone-Acetaminophen] Other (See Comments)     hallucinations  . Latex Rash    Outpatient Encounter Medications as of 05/15/2021  Medication Sig  . Accu-Chek Softclix Lancets lancets Use as directed up to 4 times daily (Patient taking differently: 1 each by Other route in the morning, at noon, in the evening, and at bedtime.)  . acetaminophen (TYLENOL) 325 MG tablet Take 650 mg by mouth every 6 (six) hours as needed for headache (pain).  Marland Kitchen albuterol (PROAIR HFA) 108 (90 Base) MCG/ACT inhaler Inhale 2 puffs into the lungs every 6 (six) hours as needed for wheezing or shortness of breath.  . Blood Glucose Monitoring Suppl (ACCU-CHEK GUIDE) w/Device KIT Use as directed (Patient taking differently: 1 each by Other route as directed.)  . clonazePAM (KLONOPIN) 1 MG tablet TAKE 1 TABLET BY MOUTH 2 TIMES A DAY AS NEEDED FOR ANXIETY (Patient taking differently: Take 1 mg by mouth 2 (two) times daily as needed for anxiety.)  . cyclobenzaprine (FLEXERIL) 10 MG tablet TAKE 1 TABLET BY MOUTH 2 TIMES A DAY AS NEEDED FOR MUSCLE SPASMS (Patient taking differently: Take 10 mg by mouth 2 (two) times daily as needed for muscle spasms.)  . glucose blood (ACCU-CHEK GUIDE) test strip Use as instructed up to 4 times daily (Patient taking differently: Use as instructed up to 4 times daily)  . LINZESS 145 MCG CAPS capsule Take 145 mcg by mouth daily as needed (constipation).  Marland Kitchen loperamide (IMODIUM) 2 MG capsule Take 2 mg by mouth as needed for diarrhea or loose stools.  Marland Kitchen losartan (COZAAR) 100 MG tablet TAKE ONE TABLET BY MOUTH DAILY  .  metFORMIN (GLUCOPHAGE) 500 MG tablet Take 1 tablet (500 mg total) by mouth 2 (two) times daily with a meal.  . metoprolol tartrate (LOPRESSOR) 25 MG tablet Take 1 tablet (25 mg total) by mouth 2 (two) times daily.  Marland Kitchen NARCAN 4 MG/0.1ML LIQD nasal spray kit Place 0.4 mg into the nose daily as needed (opioid overdose).  . ondansetron (ZOFRAN ODT) 4 MG disintegrating tablet Take 1 tablet (4 mg total) by mouth every 4 (four) hours as needed  for nausea or vomiting.  . Oxycodone HCl 10 MG TABS Take 1 tablet (10 mg total) 4 (four) times daily by mouth. Per [facility] (Patient taking differently: Take 10 mg by mouth every 4 (four) hours as needed (pain). Per [facility])  . pantoprazole (PROTONIX) 40 MG tablet TAKE ONE TABLET BY MOUTH DAILY (Patient taking differently: Take 40 mg by mouth daily.)  . polyethylene glycol powder (GLYCOLAX/MIRALAX) 17 GM/SCOOP powder Take 17 g by mouth daily as needed for mild constipation.  . potassium chloride SA (KLOR-CON) 20 MEQ tablet Take 1 tablet (20 mEq total) by mouth 2 (two) times daily.  Marland Kitchen rOPINIRole (REQUIP) 3 MG tablet TAKE 1 TABLET BY MOUTH NIGHTLY AT BEDTIME AS NEEDED (Patient taking differently: Take 3 mg by mouth at bedtime as needed (RLS).)  . Simethicone (GAS-X EXTRA STRENGTH PO) Take 1 tablet by mouth daily as needed (gas).  . Tetrahydrozoline HCl (VISINE OP) Place 1 drop into both eyes daily as needed (dry eyes).  . torsemide (DEMADEX) 20 MG tablet Take 2 tablets (40 mg total) by mouth 2 (two) times daily.  . [DISCONTINUED] DULoxetine (CYMBALTA) 60 MG capsule Take 60 mg by mouth daily.    . [DISCONTINUED] fexofenadine (ALLEGRA) 180 MG tablet Take 180 mg by mouth daily.     No facility-administered encounter medications on file as of 05/15/2021.    Patient Active Problem List   Diagnosis Date Noted  . AKI (acute kidney injury) ([place]) 04/06/2021  . Diastolic congestive heart failure ([place]) 03/17/2021  . Diabetes mellitus type 2 in obese ([place]) 03/11/2021  . Acute diastolic CHF (congestive heart failure) ([place]) 03/07/2021  . Acute on chronic diastolic (congestive) heart failure ([place]) 03/06/2021  . Vitamin D deficiency 01/01/2021  . Medial epicondylitis 12/31/2020  . Aortic atherosclerosis ([place]) 09/26/2020  . Umbilical hernia 49/44/9675  . Constipation 08/05/2020  . Arthritis 06/12/2020  . Hoarseness 05/01/2020  . Vitamin B 12 deficiency 01/31/2020  . Rosacea  01/31/2020  . Neck swelling 01/31/2020  . History of colonic polyps   . Benign neoplasm of [place]   . Nausea 08/31/2019  . Throat pain 07/26/2019  . HLD (hyperlipidemia) 05/24/2019  . Leg cramps 05/24/2019  . Lump in neck 05/24/2019  . Hyperglycemia 01/25/2019  . Left thyroid nodule 01/25/2019  . Jerking movements of extremities 07/20/2018  . Balance disorder 07/20/2018  . Right knee pain 03/21/2018  . OSA (obstructive sleep apnea) 03/21/2018  . Oxygen desaturation 03/21/2018  . Urinary symptom or sign 03/21/2018  . Acquired lymphedema 01/25/2018  . Gait disorder 01/25/2018  . Peripheral edema 01/16/2018  . SOB (shortness of breath) 01/15/2018  . Dysphonia 12/07/2017  . Encounter for well adult exam with abnormal findings 11/08/2017  . 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 Frommelt polyp 08/11/2016  . Abdominal pain, epigastric   . Dysphagia   . [place] cancer screening   . Prediabetes 07/01/2016  . ACE-inhibitor cough 12/05/2015  . Restless legs syndrome 10/08/2015  . Venous stasis dermatitis of both  lower extremities 04/26/2015  . 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 AR (allergic rhinitis) 10/17/2014  . Fatty liver 10/17/2014  . Chronic pain syndrome 09/19/2014  . Post-traumatic osteoarthritis of both knees 09/19/2014  . Spinal stenosis 09/19/2014  . Depression, major, single episode, complete remission ([place]) 09/19/2014  . Narcotic dependence ([place]) 09/19/2014  . Spondylosis of 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 03/09/2012  . Lumbar disc disease 03/09/2012  . Chronic pain 03/09/2012  . Morbid obesity ([place]) 06/17/2011  . Chronic low back pain 12/28/2008  . Anxiety state 12/06/2007  . Depression 12/06/2007  . Benign hypertension 12/06/2007  . Gastroesophageal reflux disease without esophagitis 12/06/2007  . LOW BACK PAIN 12/06/2007    Conditions to be addressed/monitored:CHF and DMII  Care Plan : Heart Failure (Adult)  Updates made by  [name] Royalty, RN since 05/15/2021 12:00 AM    Problem: Symptom Exacerbation (Heart Failure)   Priority: High    Long-Range Goal: Symptom Exacerbation Prevented or Minimized   Start Date: 04/25/2021  Expected End Date: 10/26/2021  This Visit's Progress: On track  Recent Progress: On track  Priority: High  Note:   Current Barriers:  Marland Kitchen Knowledge deficit related to basic heart failure pathophysiology and self care management . Multiple recent hospitalizations for CHF exacerbation Case Manager Clinical Goal(s):  Over the next 6 months, as evidenced by patient reporting during CCM RMN CM outreach, patient will: Marland Kitchen Monitor and record daily weights at home (notifying MD of 3 lb weight gain over night or 5 lb in a week) . Independently verbalize understanding of Heart Failure Action Plan and when to call doctor . Take all heart failure mediations as prescribed Interventions:  . Collaboration with Biagio Borg, MD regarding development and update of comprehensive plan of care as evidenced by provider attestation and co-signature . Inter-disciplinary care team collaboration (see longitudinal plan of care) . Chart reviewed including relevant office notes, upcoming scheduled appointments, and lab results . Discussed current  clinical condition with patient and confirmed no current clinical or medication concerns; patient reports "doing well and feeling fine" . Reviewed with patient recent ED visit 05/01/21; PCP office visit 05/08/21; and cardiologist virtual visit 05/09/21 . Confirmed patient is able to verbalize recent instructions from cardiologist 05/09/21 for taking extra diuretic and potassium for weight gain- confirmed has not needed any extra doses since cardiology office visit 05/09/21: encouraged patient to designate on her written weight log days that she requires "extra" diuretic: she is agreeable . Reviewed recent weights at home with patient: reports weight ranges between 360-365 with weight  today of "360 lbs" . Initiated education around CHF zones/ weight gain guidelines; encouraged patient to promptly review printed educational material I will mail to her today . Confirmed patient has no complaints or concerns around shortness of breath, leg swelling, feeling of tightness in chest cavity: encouraged her to notify care providers should these arise in the future . Confirmed home health RN remains active, OT/ PT have now signed off . Confirmed patient continues following low-salt diet; will mail additional printed education to her today around same, encouraged her prompt review . Reviewed upcoming provider appointments with patient: follow up cardiology appointment scheduled for 08/18/21  Self-Care Activities: . Patient verbalizes understanding of plan to continue monitoring and recording daily weights at home . Self administers medications as prescribed . Attends all scheduled provider appointments . Calls provider office for new concerns or questions Patient Goals: Marland Kitchen Great job continuing to monitor and write down  you daily weigh at home: today you reported a weight of 360 pounds . Follow the instructions provided to you by your cardiologist on 05/09/21 about when you should take an extra fluid pill along with the extra potassium  . Keep your cardiologist updated if you experience several days of weight gain after taking the extra fluid pill and potassium: if you gain more than 2 pounds in one day or 5 pounds in one week . Great job staying on top of your daily weights at home and notifying your care providers when you have weight gain- I am glad that you have again reported today that your weights have started decreasing . Track weight in diary or on a calendar: it is VERY important to write your daily weights down on paper . Watch for swelling in feet, ankles and legs every day: if you have an increased amount of swelling, make a note of it and let your cardiology provider know . Keep  legs up while sitting . Continue limiting the salt in your diet . Stay as active as possible without over-doing: rest if you start to feel tired or "winded"/ short of breath Follow Up Plan:  . Telephone follow up appointment with care management team member scheduled for: Tuesday June 10, 2021 at 1:00 pm . The patient has been provided with contact information for the care management team and has been advised to call with any health related questions or concerns.     Care Plan : Diabetes Type 2 (Adult)  Updates made by [name] Royalty, RN since 05/15/2021 12:00 AM    Problem: Glycemic Management (Diabetes, Type 2)   Priority: Medium    Long-Range Goal: Glycemic Management Optimized   Start Date: 04/25/2021  Expected End Date: 10/26/2021  This Visit's Progress: On track  Recent Progress: On track  Priority: Medium  Note:   Objective:  Lab Results  Component Value Date   HGBA1C 6.5 (H) 03/07/2021 .   Lab Results  Component Value Date   CREATININE 1.15 04/18/2021   CREATININE 1.22 (H) 04/11/2021   CREATININE 1.39 (H) 04/10/2021 .   Marland Kitchen No results found for: EGFR Current Barriers:  Marland Kitchen Knowledge Deficits related to basic Diabetes pathophysiology and self care/management . New to blood sugar monitoring at home Case Manager Clinical Goal(s):  Over the next 6 months, patient will demonstrate improved adherence to prescribed treatment plan for diabetes self care/management as evidenced by:  . daily monitoring/ recording of blood sugars at home . adherence to ADA/ carb modified diet  . adherence to prescribed medication regimen  . contacting provider for new or worsened symptoms or questions Interventions:  . Collaboration with Biagio Borg, MD regarding development and update of comprehensive plan of care as evidenced by provider attestation and co-signature . Inter-disciplinary care team collaboration (see longitudinal plan of care) . Review of patient status, including review of  consultants reports, relevant laboratory and other test results, and medications completed . Confirmed with patient that she has started monitoring/ recording blood sugars at home as previously discussed: positive reinforcement provided; patient now feels comfortable checking blood sugars at home and is able to verbalize previously provided education around ideal timing of when to monitor . Reviewed with patient all recently recorded blood sugars at home since our last outreach: reports fasting ranges between 100-121 and post-prandial values between 105-148, with one isolated post-prandial value of 198 . Reinforced previously provided education around dietary strategies to consider in setting of DM- patient  will require ongoing reinforcement of same . Confirmed patient has not yet checked mail to review previously mailed printed educational material around same- encouraged patient to check mail, review and write down questions . Discussed plans with patient for ongoing care management follow up and provided patient with direct contact information for care management team Self-Care Activities . Self administers oral medications as prescribed . Attends all scheduled provider appointments Patient Goals: Marland Kitchen Great job monitoring and recording your blood sugar!  Continue to write down your blood sugars at home first thing in the morning before you eat (fasting) and 2 hours after eating a regular meal- alternating days: today, your reported blood sugar values at home look great and right on target . Re- check blood sugar if I feel it is too high or too low, or if you don't feel well in general . Enter/ record blood sugar readings into daily log- write down your blood sugars on paper in a dedicated place where you can review them easily- we will review your blood sugars each time we talk . Take the blood sugar log to all doctor visits- these values will help your doctor make sure you are on the right  medication . Please read over the attached information I sent to you on 04/25/21 and 04/30/21 about checking your blood sugars at home and foods that are good to eat when you have diabetes Follow Up Plan:  . Telephone follow up appointment with care management team member scheduled for: Tuesday June 10, 2021 at 1:00 pm . The patient has been provided with contact information for the care management team and has been advised to call with any health related questions or concerns.      Plan:  Telephone follow up appointment with care management team member scheduled for:  Tuesday June 10, 2021 at 1:00 pm  The patient has been provided with contact information for the care management team and has been advised to call with any health related questions or concerns.   [name], RN, BSN, [facility] RN Care Coordination- [place] [phone]: direct office [phone]: mobile

## 2021-05-15 NOTE — Telephone Encounter (Signed)
  Chronic Care Management   Follow Up Note   05/15/2021 Name: Maria [name] MRN: [id number] DOB: 10-27-1956   Referred by: Biagio Borg, MD Reason for referral : Chronic Care Management (CCM RN Follow up appointment, DM, CHF: unsuccessful outreach)  An unsuccessful telephone outreach was attempted today. The patient was referred to the case management team for assistance with care management and care coordination.  Call placed today for previously scheduled follow up telephone appointment with patient.  Follow Up Plan:   A HIPPA compliant phone message x 2 was left for the patient providing contact information and requesting a return call  Will request that care guide contact patient to re-schedule today's missed appointment if patient does not return call  [name], RN, BSN, [place] [phone]: direct office [phone]: mobile

## 2021-05-15 NOTE — Patient Instructions (Signed)
Visit [place], it was nice talking with you today   Please read over the attached information, and keep up the great work monitoring/ recording your daily weights and blood sugars at home  Please review the information I have been sending you soon and write down any questions you have after you read over the information   I look forward to talking to you again for an update on Tuesday June 10, 2021 at 1:00 pm - please be listening out for my call that day.  I will call as close to 1:00 pm as possible; I look forward to hearing about your progress.   Please don't hesitate to contact me if I can be of assistance to you before our next scheduled appointment.   [name], RN, BSN, [facility] RN Care Coordination- [place] [phone]: direct office [phone]: mobile   PATIENT GOALS: Goals Addressed            This Visit's Progress   . Monitor and Manage My Blood Sugar-Diabetes Type 2   On track    Timeframe:  Long-Range Goal Priority:  Medium Start Date:     04/25/21                        Expected End Date:   10/26/21                    Follow Up Date 06/10/21   . Great job monitoring and recording your blood sugar!  Continue to write down your blood sugars at home first thing in the morning before you eat (fasting) and 2 hours after eating a regular meal- alternating days: today, your reported blood sugar values at home look great and right on target . Re- check blood sugar if I feel it is too high or too low, or if you don't feel well in general . Enter/ record blood sugar readings into daily log- write down your blood sugars on paper in a dedicated place where you can review them easily- we will review your blood sugars each time we talk . Take the blood sugar log to all doctor visits- these values will help your doctor make sure you are on the right medication . Please read over the attached information I sent to you on 04/25/21  and 04/30/21 about checking your blood sugars at home and foods that are good to eat when you have diabetes   Why is this important?    Checking your blood sugar at home helps to keep it from getting very high or very low.   Writing the results in a diary or log helps the doctor know how to care for you.   Your blood sugar log should have the time, date and the results.   Also, write down the amount of insulin or other medicine that you take.   Other information, like what you ate, exercise done and how you were feeling, will also be helpful.         . Track and Manage Fluids and Swelling-Heart Failure   On track    Timeframe:  Long-Range Goal Priority:  High Start Date:  04/25/21                           Expected End Date:      10/26/21  Follow Up Date 06/10/21   . Great job continuing to monitor and write down you daily weigh at home: today you reported a weight of 360 pounds . Follow the instructions provided to you by your cardiologist on 05/09/21 about when you should take an extra fluid pill along with the extra potassium  . Keep your cardiologist updated if you experience several days of weight gain after taking the extra fluid pill and potassium: if you gain more than 2 pounds in one day or 5 pounds in one week . Great job staying on top of your daily weights at home and notifying your care providers when you have weight gain- I am glad that you have again reported today that your weights have started decreasing . Track weight in diary or on a calendar: it is VERY important to write your daily weights down on paper . Watch for swelling in feet, ankles and legs every day: if you have an increased amount of swelling, make a note of it and let your cardiology provider know . Keep legs up while sitting . Continue limiting the salt in your diet . Stay as active as possible without over-doing: rest if you start to feel tired or "winded"/ short of breath  Why is this  important?    It is important to check your weight daily and watch how much salt and liquids you have.   It will help you to manage your heart failure.          Heart Failure Action Plan A heart failure action plan helps you understand what to do when you have symptoms of heart failure. Your action plan is a color-coded plan that lists the symptoms to watch for and indicates what actions to take.  If you have symptoms in the red zone, you need medical care right away.  If you have symptoms in the yellow zone, you are having problems.  If you have symptoms in the green zone, you are doing well. Follow the plan that was created by you and your health care provider. Review your plan each time you visit your health care provider. Red zone These signs and symptoms mean you should get medical help right away:  You have trouble breathing when resting.  You have a dry cough that is getting worse.  You have swelling or pain in your legs or abdomen that is getting worse.  You suddenly gain more than 2-3 lb (0.9-1.4 kg) in 24 hours, or more than 5 lb (2.3 kg) in a week. This amount may be more or less depending on your condition.  You have trouble staying awake or you feel confused.  You have chest pain.  You do not have an appetite.  You pass out.  You have worsening sadness or depression. If you have any of these symptoms, call your local emergency services (911 in the [place] right away. Do not drive yourself to the hospital.   Yellow zone These signs and symptoms mean your condition may be getting worse and you should make some changes:  You have trouble breathing when you are active, or you need to sleep with your head raised on extra pillows to help you breathe.  You have swelling in your legs or abdomen.  You gain 2-3 lb (0.9-1.4 kg) in 24 hours, or 5 lb (2.3 kg) in a week. This amount may be more or less depending on your condition.  You get tired easily.  You have  trouble sleeping.  You have a dry cough. If you have any of these symptoms:  Contact your health care provider within the next day.  Your health care provider may adjust your medicines.   Green zone These signs mean you are doing well and can continue what you are doing:  You do not have shortness of breath.  You have very little swelling or no new swelling.  Your weight is stable (no gain or loss).  You have a normal activity level.  You do not have chest pain or any other new symptoms.   Follow these instructions at home:  Take over-the-counter and prescription medicines only as told by your health care provider.  Weigh yourself daily. Your target weight is __________ lb (__________ kg). ? Call your health care provider if you gain more than __________ lb (__________ kg) in 24 hours, or more than __________ lb (__________ kg) in a week. ? Health care provider name: _____________________________________________________ ? Health care provider phone number: _____________________________________________________  Eat a heart-healthy diet. Work with a diet and nutrition specialist (dietitian) to create an eating plan that is best for you.  Keep all follow-up visits. This is important. Where to find more information  American Heart Association: www.heart.org Summary  A heart failure action plan helps you understand what to do when you have symptoms of heart failure.  Follow the action plan that was created by you and your health care provider.  Get help right away if you have any symptoms in the red zone. This information is not intended to replace advice given to you by your health care provider. Make sure you discuss any questions you have with your health care provider. Document Revised: 07/22/2020 Document Reviewed: 07/22/2020 Elsevier Patient Education  2021 [employer].   Low-Sodium Eating Plan Sodium, which is an element that makes up salt, helps you maintain a  healthy balance of fluids in your body. Too much sodium can increase your blood pressure and cause fluid and waste to be held in your body. Your health care provider or dietitian may recommend following this plan if you have high blood pressure (hypertension), kidney disease, liver disease, or heart failure. Eating less sodium can help lower your blood pressure, reduce swelling, and protect your heart, liver, and kidneys. What are tips for following this plan? Reading food labels  The Nutrition Facts label lists the amount of sodium in one serving of the food. If you eat more than one serving, you must multiply the listed amount of sodium by the number of servings.  Choose foods with less than 140 mg of sodium per serving.  Avoid foods with 300 mg of sodium or more per serving. Shopping  Look for lower-sodium products, often labeled as "low-sodium" or "no salt added."  Always check the sodium content, even if foods are labeled as "unsalted" or "no salt added."  Buy fresh foods. ? Avoid canned foods and pre-made or frozen meals. ? Avoid canned, cured, or processed meats.  Buy breads that have less than 80 mg of sodium per slice.   Cooking  Eat more home-cooked food and less restaurant, buffet, and fast food.  Avoid adding salt when cooking. Use salt-free seasonings or herbs instead of table salt or sea salt. Check with your health care provider or pharmacist before using salt substitutes.  Cook with plant-based oils, such as canola, sunflower, or olive oil.   Meal planning  When eating at a restaurant, ask that your food be prepared with less salt or  no salt, if possible. Avoid dishes labeled as brined, pickled, cured, smoked, or made with soy sauce, miso, or teriyaki sauce.  Avoid foods that contain MSG (monosodium glutamate). MSG is sometimes added to [place] food, bouillon, and some canned foods.  Make meals that can be grilled, baked, poached, roasted, or steamed. These are  generally made with less sodium. General information Most people on this plan should limit their sodium intake to 1,500-2,000 mg (milligrams) of sodium each day. What foods should I eat? Fruits Fresh, frozen, or canned fruit. Fruit juice. Vegetables Fresh or frozen vegetables. "No salt added" canned vegetables. "No salt added" tomato sauce and paste. Low-sodium or reduced-sodium tomato and vegetable juice. Grains Low-sodium cereals, including oats, puffed wheat and rice, and shredded wheat. Low-sodium crackers. Unsalted rice. Unsalted pasta. Low-sodium bread. Whole-grain breads and whole-grain pasta. Meats and other proteins Fresh or frozen (no salt added) meat, poultry, seafood, and fish. Low-sodium canned tuna and salmon. Unsalted nuts. Dried peas, beans, and lentils without added salt. Unsalted canned beans. Eggs. Unsalted nut butters. Dairy Milk. Soy milk. Cheese that is naturally low in sodium, such as ricotta cheese, fresh mozzarella, or Swiss cheese. Low-sodium or reduced-sodium cheese. Cream cheese. Yogurt. Seasonings and condiments Fresh and dried herbs and spices. Salt-free seasonings. Low-sodium mustard and ketchup. Sodium-free salad dressing. Sodium-free light mayonnaise. Fresh or refrigerated horseradish. Lemon juice. Vinegar. Other foods Homemade, reduced-sodium, or low-sodium soups. Unsalted popcorn and pretzels. Low-salt or salt-free chips. The items listed above may not be a complete list of foods and beverages you can eat. Contact a dietitian for more information. What foods should I avoid? Vegetables Sauerkraut, pickled vegetables, and relishes. Olives. [place] fries. Onion rings. Regular canned vegetables (not low-sodium or reduced-sodium). Regular canned tomato sauce and paste (not low-sodium or reduced-sodium). Regular tomato and vegetable juice (not low-sodium or reduced-sodium). Frozen vegetables in sauces. Grains Instant hot cereals. Bread stuffing, pancake, and biscuit  mixes. Croutons. Seasoned rice or pasta mixes. Noodle soup cups. Boxed or frozen macaroni and cheese. Regular salted crackers. Self-rising flour. Meats and other proteins Meat or fish that is salted, canned, smoked, spiced, or pickled. Precooked or cured meat, such as sausages or meat loaves. Berniece Salines. Ham. Pepperoni. Hot dogs. Corned beef. Chipped beef. Salt pork. Jerky. Pickled herring. Anchovies and sardines. Regular canned tuna. Salted nuts. Dairy Processed cheese and cheese spreads. Hard cheeses. Cheese curds. Blue cheese. Feta cheese. String cheese. Regular cottage cheese. Buttermilk. Canned milk. Fats and oils Salted butter. Regular margarine. Ghee. Bacon fat. Seasonings and condiments Onion salt, garlic salt, seasoned salt, table salt, and sea salt. Canned and packaged gravies. Worcestershire sauce. Tartar sauce. Barbecue sauce. Teriyaki sauce. Soy sauce, including reduced-sodium. Steak sauce. Fish sauce. Oyster sauce. Cocktail sauce. Horseradish that you find on the shelf. Regular ketchup and mustard. Meat flavorings and tenderizers. Bouillon cubes. Hot sauce. Pre-made or packaged marinades. Pre-made or packaged taco seasonings. Relishes. Regular salad dressings. Salsa. Other foods Salted popcorn and pretzels. Corn chips and puffs. Potato and tortilla chips. Canned or dried soups. Pizza. Frozen entrees and pot pies. The items listed above may not be a complete list of foods and beverages you should avoid. Contact a dietitian for more information. Summary  Eating less sodium can help lower your blood pressure, reduce swelling, and protect your heart, liver, and kidneys.  Most people on this plan should limit their sodium intake to 1,500-2,000 mg (milligrams) of sodium each day.  Canned, boxed, and frozen foods are high in sodium. Restaurant foods, fast foods,  and pizza are also very high in sodium. You also get sodium by adding salt to food.  Try to cook at home, eat more fresh fruits and  vegetables, and eat less fast food and canned, processed, or prepared foods. This information is not intended to replace advice given to you by your health care provider. Make sure you discuss any questions you have with your health care provider. Document Revised: 01/12/2020 Document Reviewed: 11/08/2019 Elsevier Patient Education  2021 [employer].    The patient verbalized understanding of instructions, educational materials, and care plan provided today and agreed to receive a mailed copy of patient instructions, educational materials, and care plan.   Telephone follow up appointment with care management team member scheduled for:  Tuesday June 10, 2021 at 1:00 pm  The patient has been provided with contact information for the care management team and has been advised to call with any health related questions or concerns.   [name], RN, BSN, [facility] RN Care Coordination- [place] [phone]: direct office [phone]: mobile

## 2021-05-21 DIAGNOSIS — Z7984 Long term (current) use of oral hypoglycemic drugs: Secondary | ICD-10-CM | POA: Diagnosis not present

## 2021-05-21 DIAGNOSIS — M19011 Primary osteoarthritis, right shoulder: Secondary | ICD-10-CM | POA: Diagnosis not present

## 2021-05-21 DIAGNOSIS — J441 Chronic obstructive pulmonary disease with (acute) exacerbation: Secondary | ICD-10-CM | POA: Diagnosis not present

## 2021-05-21 DIAGNOSIS — E785 Hyperlipidemia, unspecified: Secondary | ICD-10-CM | POA: Diagnosis not present

## 2021-05-21 DIAGNOSIS — E876 Hypokalemia: Secondary | ICD-10-CM | POA: Diagnosis not present

## 2021-05-21 DIAGNOSIS — K5900 Constipation, unspecified: Secondary | ICD-10-CM | POA: Diagnosis not present

## 2021-05-21 DIAGNOSIS — Z9181 History of falling: Secondary | ICD-10-CM | POA: Diagnosis not present

## 2021-05-21 DIAGNOSIS — M545 Low back pain, unspecified: Secondary | ICD-10-CM | POA: Diagnosis not present

## 2021-05-21 DIAGNOSIS — I872 Venous insufficiency (chronic) (peripheral): Secondary | ICD-10-CM | POA: Diagnosis not present

## 2021-05-21 DIAGNOSIS — N179 Acute kidney failure, unspecified: Secondary | ICD-10-CM | POA: Diagnosis not present

## 2021-05-21 DIAGNOSIS — G8929 Other chronic pain: Secondary | ICD-10-CM | POA: Diagnosis not present

## 2021-05-21 DIAGNOSIS — I700 Atherosclerosis of aorta: Secondary | ICD-10-CM | POA: Diagnosis not present

## 2021-05-21 DIAGNOSIS — I110 Hypertensive heart disease with heart failure: Secondary | ICD-10-CM | POA: Diagnosis not present

## 2021-05-21 DIAGNOSIS — I5032 Chronic diastolic (congestive) heart failure: Secondary | ICD-10-CM | POA: Diagnosis not present

## 2021-05-21 DIAGNOSIS — E1151 Type 2 diabetes mellitus with diabetic peripheral angiopathy without gangrene: Secondary | ICD-10-CM | POA: Diagnosis not present

## 2021-05-22 DIAGNOSIS — G894 Chronic pain syndrome: Secondary | ICD-10-CM | POA: Diagnosis not present

## 2021-05-28 DIAGNOSIS — I872 Venous insufficiency (chronic) (peripheral): Secondary | ICD-10-CM | POA: Diagnosis not present

## 2021-05-28 DIAGNOSIS — K5900 Constipation, unspecified: Secondary | ICD-10-CM | POA: Diagnosis not present

## 2021-05-28 DIAGNOSIS — G8929 Other chronic pain: Secondary | ICD-10-CM | POA: Diagnosis not present

## 2021-05-28 DIAGNOSIS — I110 Hypertensive heart disease with heart failure: Secondary | ICD-10-CM | POA: Diagnosis not present

## 2021-05-28 DIAGNOSIS — M19011 Primary osteoarthritis, right shoulder: Secondary | ICD-10-CM | POA: Diagnosis not present

## 2021-05-28 DIAGNOSIS — I700 Atherosclerosis of aorta: Secondary | ICD-10-CM | POA: Diagnosis not present

## 2021-05-28 DIAGNOSIS — E1151 Type 2 diabetes mellitus with diabetic peripheral angiopathy without gangrene: Secondary | ICD-10-CM | POA: Diagnosis not present

## 2021-05-28 DIAGNOSIS — E876 Hypokalemia: Secondary | ICD-10-CM | POA: Diagnosis not present

## 2021-05-28 DIAGNOSIS — E785 Hyperlipidemia, unspecified: Secondary | ICD-10-CM | POA: Diagnosis not present

## 2021-05-28 DIAGNOSIS — N179 Acute kidney failure, unspecified: Secondary | ICD-10-CM | POA: Diagnosis not present

## 2021-05-28 DIAGNOSIS — J441 Chronic obstructive pulmonary disease with (acute) exacerbation: Secondary | ICD-10-CM | POA: Diagnosis not present

## 2021-05-28 DIAGNOSIS — I5032 Chronic diastolic (congestive) heart failure: Secondary | ICD-10-CM | POA: Diagnosis not present

## 2021-05-28 DIAGNOSIS — Z9181 History of falling: Secondary | ICD-10-CM | POA: Diagnosis not present

## 2021-05-28 DIAGNOSIS — Z7984 Long term (current) use of oral hypoglycemic drugs: Secondary | ICD-10-CM | POA: Diagnosis not present

## 2021-05-28 DIAGNOSIS — M545 Low back pain, unspecified: Secondary | ICD-10-CM | POA: Diagnosis not present

## 2021-05-30 DIAGNOSIS — M25562 Pain in left knee: Secondary | ICD-10-CM | POA: Diagnosis not present

## 2021-05-30 DIAGNOSIS — J321 Chronic frontal sinusitis: Secondary | ICD-10-CM | POA: Diagnosis not present

## 2021-05-30 DIAGNOSIS — Z79891 Long term (current) use of opiate analgesic: Secondary | ICD-10-CM | POA: Diagnosis not present

## 2021-05-30 DIAGNOSIS — M25552 Pain in left hip: Secondary | ICD-10-CM | POA: Diagnosis not present

## 2021-05-30 DIAGNOSIS — M25561 Pain in right knee: Secondary | ICD-10-CM | POA: Diagnosis not present

## 2021-05-30 DIAGNOSIS — G894 Chronic pain syndrome: Secondary | ICD-10-CM | POA: Diagnosis not present

## 2021-05-30 DIAGNOSIS — G990 Autonomic neuropathy in diseases classified elsewhere: Secondary | ICD-10-CM | POA: Diagnosis not present

## 2021-05-30 DIAGNOSIS — M1711 Unilateral primary osteoarthritis, right knee: Secondary | ICD-10-CM | POA: Diagnosis not present

## 2021-05-30 DIAGNOSIS — G8929 Other chronic pain: Secondary | ICD-10-CM | POA: Diagnosis not present

## 2021-05-30 DIAGNOSIS — J3089 Other allergic rhinitis: Secondary | ICD-10-CM | POA: Diagnosis not present

## 2021-05-30 DIAGNOSIS — Z0182 Encounter for allergy testing: Secondary | ICD-10-CM | POA: Diagnosis not present

## 2021-06-04 ENCOUNTER — Other Ambulatory Visit: Payer: Self-pay

## 2021-06-04 MED ORDER — POTASSIUM CHLORIDE CRYS ER 20 MEQ PO TBCR: 20.0000 meq | EXTENDED_RELEASE_TABLET | Freq: Two times a day (BID) | ORAL | 11 refills | Status: DC

## 2021-06-04 NOTE — Telephone Encounter (Signed)
This is Dr. Hochrein's pt. °

## 2021-06-05 ENCOUNTER — Telehealth: Payer: Self-pay | Admitting: Cardiology

## 2021-06-05 MED ORDER — POTASSIUM CHLORIDE CRYS ER 20 MEQ PO TBCR: 20.0000 meq | EXTENDED_RELEASE_TABLET | Freq: Two times a day (BID) | ORAL | 3 refills | Status: DC

## 2021-06-05 NOTE — Telephone Encounter (Signed)
*  STAT* If patient is at the pharmacy, call can be transferred to refill team.   1. Which medications need to be refilled? (please list name of each medication and dose if known)  potassium chloride SA (KLOR-CON) 20 MEQ tablet  2. Which pharmacy/location (including street and city if local pharmacy) is medication to be sent to? [place], [place]  3. Do they need a 30 day or 90 day supply? 90 day supply  Patient states her medication was sent to the wrong pharmacy ([facility], [address]).  She would like to have the Rx transferred to the pharmacy listed above.

## 2021-06-05 NOTE — Telephone Encounter (Signed)
Was trying to call to let patient know that refills for her Potassium has been sent to the correct pharmacy.

## 2021-06-09 DIAGNOSIS — M19011 Primary osteoarthritis, right shoulder: Secondary | ICD-10-CM | POA: Diagnosis not present

## 2021-06-09 DIAGNOSIS — K5900 Constipation, unspecified: Secondary | ICD-10-CM | POA: Diagnosis not present

## 2021-06-09 DIAGNOSIS — E1151 Type 2 diabetes mellitus with diabetic peripheral angiopathy without gangrene: Secondary | ICD-10-CM | POA: Diagnosis not present

## 2021-06-09 DIAGNOSIS — M545 Low back pain, unspecified: Secondary | ICD-10-CM | POA: Diagnosis not present

## 2021-06-09 DIAGNOSIS — Z7984 Long term (current) use of oral hypoglycemic drugs: Secondary | ICD-10-CM | POA: Diagnosis not present

## 2021-06-09 DIAGNOSIS — I872 Venous insufficiency (chronic) (peripheral): Secondary | ICD-10-CM | POA: Diagnosis not present

## 2021-06-09 DIAGNOSIS — I5032 Chronic diastolic (congestive) heart failure: Secondary | ICD-10-CM | POA: Diagnosis not present

## 2021-06-09 DIAGNOSIS — J441 Chronic obstructive pulmonary disease with (acute) exacerbation: Secondary | ICD-10-CM | POA: Diagnosis not present

## 2021-06-09 DIAGNOSIS — I110 Hypertensive heart disease with heart failure: Secondary | ICD-10-CM | POA: Diagnosis not present

## 2021-06-09 DIAGNOSIS — Z9181 History of falling: Secondary | ICD-10-CM | POA: Diagnosis not present

## 2021-06-09 DIAGNOSIS — E876 Hypokalemia: Secondary | ICD-10-CM | POA: Diagnosis not present

## 2021-06-09 DIAGNOSIS — E785 Hyperlipidemia, unspecified: Secondary | ICD-10-CM | POA: Diagnosis not present

## 2021-06-09 DIAGNOSIS — G8929 Other chronic pain: Secondary | ICD-10-CM | POA: Diagnosis not present

## 2021-06-09 DIAGNOSIS — I700 Atherosclerosis of aorta: Secondary | ICD-10-CM | POA: Diagnosis not present

## 2021-06-09 DIAGNOSIS — N179 Acute kidney failure, unspecified: Secondary | ICD-10-CM | POA: Diagnosis not present

## 2021-06-10 ENCOUNTER — Ambulatory Visit (INDEPENDENT_AMBULATORY_CARE_PROVIDER_SITE_OTHER): Payer: Medicare Other | Admitting: *Deleted

## 2021-06-10 DIAGNOSIS — E1169 Type 2 diabetes mellitus with other specified complication: Secondary | ICD-10-CM

## 2021-06-10 DIAGNOSIS — I5032 Chronic diastolic (congestive) heart failure: Secondary | ICD-10-CM | POA: Diagnosis not present

## 2021-06-10 NOTE — Chronic Care Management (AMB) (Signed)
Chronic Care Management   CCM RN Visit Note  06/10/2021 Name: Maria [name] MRN: [id number] DOB: 03-Jun-1956  Subjective: Maria [name] Bixler is a 65 y.o. year old female who is a primary care patient of Biagio Borg, MD. The care management team was consulted for assistance with disease management and care coordination needs.    Engaged with patient by telephone for follow up visit in response to provider referral for case management and/or care coordination services.   Consent to Services:  The patient was given information about Chronic Care Management services, agreed to services, and gave verbal consent prior to initiation of services.  Please see initial visit note for detailed documentation.  Patient agreed to services and verbal consent obtained.   Assessment: Review of patient past medical history, allergies, medications, health status, including review of consultants reports, laboratory and other test data, was performed as part of comprehensive evaluation and provision of chronic care management services.   CCM Care Plan  Allergies  Allergen Reactions   Amoxicillin-Pot Clavulanate Nausea And Vomiting    Projectile vomiting Did it involve swelling of the face/tongue/throat, SOB, or low BP? No Did it involve sudden or severe rash/hives, skin peeling, or any reaction on the inside of your mouth or nose? No Did you need to seek medical attention at a hospital or doctor's office? No When did it last happen?      20-30 years If all above answers are "NO", may proceed with cephalosporin use.    Adhesive [Tape] Other (See Comments)    Tears skin off - use paper tape    Codeine Other (See Comments)    hallucinations   Crestor [Rosuvastatin Calcium]     Severe muscle cramps   Lactose Intolerance (Gi) Nausea And Vomiting   Morphine And Related Nausea And Vomiting and Other (See Comments)    Migraine headaches   Vicodin [Hydrocodone-Acetaminophen] Other (See Comments)     hallucinations   Latex Rash    Outpatient Encounter Medications as of 06/10/2021  Medication Sig   Accu-Chek Softclix Lancets lancets Use as directed up to 4 times daily (Patient taking differently: 1 each by Other route in the morning, at noon, in the evening, and at bedtime.)   acetaminophen (TYLENOL) 325 MG tablet Take 650 mg by mouth every 6 (six) hours as needed for headache (pain).   albuterol (PROAIR HFA) 108 (90 Base) MCG/ACT inhaler Inhale 2 puffs into the lungs every 6 (six) hours as needed for wheezing or shortness of breath.   Blood Glucose Monitoring Suppl (ACCU-CHEK GUIDE) w/Device KIT Use as directed (Patient taking differently: 1 each by Other route as directed.)   clonazePAM (KLONOPIN) 1 MG tablet TAKE 1 TABLET BY MOUTH 2 TIMES A DAY AS NEEDED FOR ANXIETY (Patient taking differently: Take 1 mg by mouth 2 (two) times daily as needed for anxiety.)   cyclobenzaprine (FLEXERIL) 10 MG tablet TAKE 1 TABLET BY MOUTH 2 TIMES A DAY AS NEEDED FOR MUSCLE SPASMS (Patient taking differently: Take 10 mg by mouth 2 (two) times daily as needed for muscle spasms.)   glucose blood (ACCU-CHEK GUIDE) test strip Use as instructed up to 4 times daily (Patient taking differently: Use as instructed up to 4 times daily)   LINZESS 145 MCG CAPS capsule Take 145 mcg by mouth daily as needed (constipation).   loperamide (IMODIUM) 2 MG capsule Take 2 mg by mouth as needed for diarrhea or loose stools.   losartan (COZAAR) 100 MG tablet TAKE ONE  TABLET BY MOUTH DAILY   metFORMIN (GLUCOPHAGE) 500 MG tablet Take 1 tablet (500 mg total) by mouth 2 (two) times daily with a meal.   metoprolol tartrate (LOPRESSOR) 25 MG tablet Take 1 tablet (25 mg total) by mouth 2 (two) times daily.   NARCAN 4 MG/0.1ML LIQD nasal spray kit Place 0.4 mg into the nose daily as needed (opioid overdose).   ondansetron (ZOFRAN ODT) 4 MG disintegrating tablet Take 1 tablet (4 mg total) by mouth every 4 (four) hours as needed for nausea or  vomiting.   Oxycodone HCl 10 MG TABS Take 1 tablet (10 mg total) 4 (four) times daily by mouth. Per [facility] (Patient taking differently: Take 10 mg by mouth every 4 (four) hours as needed (pain). Per [facility])   pantoprazole (PROTONIX) 40 MG tablet TAKE ONE TABLET BY MOUTH DAILY (Patient taking differently: Take 40 mg by mouth daily.)   polyethylene glycol powder (GLYCOLAX/MIRALAX) 17 GM/SCOOP powder Take 17 g by mouth daily as needed for mild constipation.   potassium chloride SA (KLOR-CON) 20 MEQ tablet Take 1 tablet (20 mEq total) by mouth 2 (two) times daily.   rOPINIRole (REQUIP) 3 MG tablet TAKE 1 TABLET BY MOUTH NIGHTLY AT BEDTIME AS NEEDED (Patient taking differently: Take 3 mg by mouth at bedtime as needed (RLS).)   Simethicone (GAS-X EXTRA STRENGTH PO) Take 1 tablet by mouth daily as needed (gas).   Tetrahydrozoline HCl (VISINE OP) Place 1 drop into both eyes daily as needed (dry eyes).   torsemide (DEMADEX) 20 MG tablet Take 2 tablets (40 mg total) by mouth 2 (two) times daily.   [DISCONTINUED] DULoxetine (CYMBALTA) 60 MG capsule Take 60 mg by mouth daily.     [DISCONTINUED] fexofenadine (ALLEGRA) 180 MG tablet Take 180 mg by mouth daily.     No facility-administered encounter medications on file as of 06/10/2021.    Patient Active Problem List   Diagnosis Date Noted   AKI (acute kidney injury) ([place]) 08/67/6195   Diastolic congestive heart failure ([place]) 03/17/2021   Diabetes mellitus type 2 in obese ([place]) 09/32/6712   Acute diastolic CHF (congestive heart failure) ([place]) 03/07/2021   Acute on chronic diastolic (congestive) heart failure ([place]) 03/06/2021   Vitamin D deficiency 01/01/2021   Medial epicondylitis 12/31/2020   Aortic atherosclerosis ([place]) 45/80/9983   Umbilical hernia 38/25/0539   Constipation 08/05/2020   Arthritis 06/12/2020   Hoarseness 05/01/2020   Vitamin B 12 deficiency 01/31/2020   Rosacea 01/31/2020   Neck swelling 01/31/2020    History of colonic polyps    Benign neoplasm of [place]    Nausea 08/31/2019   Throat pain 07/26/2019   HLD (hyperlipidemia) 05/24/2019   Leg cramps 05/24/2019   Lump in neck 05/24/2019   Hyperglycemia 01/25/2019   Left thyroid nodule 01/25/2019   Jerking movements of extremities 07/20/2018   Balance disorder 07/20/2018   Right knee pain 03/21/2018   OSA (obstructive sleep apnea) 03/21/2018   Oxygen desaturation 03/21/2018   Urinary symptom or sign 03/21/2018   Acquired lymphedema 01/25/2018   Gait disorder 01/25/2018   Peripheral edema 01/16/2018   SOB (shortness of breath) 01/15/2018   Dysphonia 12/07/2017   Encounter for well adult exam with abnormal findings 11/08/2017   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   [place] polyp 08/11/2016   Abdominal pain, epigastric    Dysphagia    [place] cancer screening    Prediabetes 07/01/2016   ACE-inhibitor cough 12/05/2015   Restless legs syndrome 10/08/2015  Venous stasis dermatitis of both lower extremities 04/26/2015   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/17/2014   AR (allergic rhinitis) 10/17/2014   Fatty liver 10/17/2014   Chronic pain syndrome 09/19/2014   Post-traumatic osteoarthritis of both knees 09/19/2014   Spinal stenosis 09/19/2014   Depression, major, single episode, complete remission ([place]) 09/19/2014   Narcotic dependence ([place]) 09/19/2014   Spondylosis of 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 03/09/2012   Lumbar disc disease 03/09/2012   Chronic pain 03/09/2012   Morbid obesity ([place]) 06/17/2011   Chronic low back pain 12/28/2008   Anxiety state 12/06/2007   Depression 12/06/2007   Benign hypertension 12/06/2007   Gastroesophageal reflux disease without esophagitis 12/06/2007   LOW BACK PAIN 12/06/2007    Conditions to be addressed/monitored:CHF and DMII  Care Plan : Heart Failure (Adult)  Updates made by [name] Royalty, RN since 06/10/2021 12:00 AM     Problem: Symptom Exacerbation (Heart  Failure)   Priority: High     Long-Range Goal: Symptom Exacerbation Prevented or Minimized   Start Date: 04/25/2021  Expected End Date: 10/26/2021  This Visit's Progress: On track  Recent Progress: On track  Priority: High  Note:   Current Barriers:  Knowledge deficit related to basic heart failure pathophysiology and self care management Multiple recent hospitalizations for CHF exacerbation Case Manager Clinical Goal(s):  Over the next 6 months, as evidenced by patient reporting during CCM RMN CM outreach, patient will: Monitor and record daily weights at home (notifying MD of 3 lb weight gain over night or 5 lb in a week) Independently verbalize understanding of Heart Failure Action Plan and when to call doctor Take all heart failure mediations as prescribed Interventions:  Collaboration with Biagio Borg, MD regarding development and update of comprehensive plan of care as evidenced by provider attestation and co-signature Inter-disciplinary care team collaboration (see longitudinal plan of care) Chart reviewed including relevant office notes, upcoming scheduled appointments, and lab results Discussed current  clinical condition with patient and confirmed no current clinical or medication concerns; patient reports "doing better" today; states home health RN visited yesterday and has now signed off Confirmed patient remains able to verbalize recent instructions from cardiologist 05/09/21 for taking extra diuretic and potassium for weight gain- confirmed she has not needed any extra doses since our last outreach by telephone on 05/15/21-- encouraged patient to designate on her written weight log days that she requires "extra" diuretic: she is agreeable Reviewed recent weights at home with patient: reports weight ranges between 357-358 lbs with weight today of "357 lbs" Reiterated previously provided printed/ verbal education around CHF zones/ weight gain guidelines; encouraged patient to  review printed educational material periodically to keep her on track Confirmed patient has no complaints or concerns around shortness of breath, leg swelling, feeling of tightness in chest cavity: encouraged her to notify care providers should these arise in the future, patient reports that her lower extremity swelling "has gone away" Reviewed with patient signs/ symptoms MI/ CVA, along with corresponding action plan; discussed need for daily assessment/ weights at home and action plan for weight gain vs. acute emergencies Confirmed patient continues following low-salt/ low carb/ low sugar diet; positive reinforcement provided Reviewed upcoming provider appointments with patient: follow up pain management provider for injection of (R) knee on 06/27/21; cardiology virtual appointment scheduled for 08/18/21  Self-Care Activities: Patient verbalizes understanding of plan to continue monitoring and recording daily weights at home Self administers medications as prescribed Attends all scheduled provider appointments Calls  provider office for new concerns or questions Patient Goals: Doristine Devoid job continuing to monitor and write down you daily weigh at home: today you reported a weight of 357 pounds-- this is a great improvement from your previously reported weights at home Follow the instructions provided to you by your cardiologist on 05/09/21 about when you should take an extra fluid pill along with the extra potassium-- I am glad to hear that you have not needed the extra fluid pill recently  Keep your cardiologist updated if you experience several days of weight gain after taking the extra fluid pill and potassium: if you gain more than 2 pounds in one day or 5 pounds in one week Great job staying on top of your daily weights at home and notifying your care providers when you have weight gain Great job tracking weight in diary or on a calendar: it is VERY important to write your daily weights down on  paper Watch for swelling in feet, ankles and legs every day: if you have an increased amount of swelling, make a note of it and let your cardiology provider know-- I am glad to hear that your swelling has improved Keep legs up while sitting Continue limiting the salt in your diet Stay as active as possible without over-doing: rest if you start to feel tired or "winded"/ short of breath Follow Up Plan:  Telephone follow up appointment with care management team member scheduled for: Tuesday June 15, 2021 at 1:00 pm The patient has been provided with contact information for the care management team and has been advised to call with any health related questions or concerns.      Care Plan : Diabetes Type 2 (Adult)  Updates made by [name] Royalty, RN since 06/10/2021 12:00 AM     Problem: Glycemic Management (Diabetes, Type 2)   Priority: Medium     Long-Range Goal: Glycemic Management Optimized   Start Date: 04/25/2021  Expected End Date: 10/26/2021  This Visit's Progress: On track  Recent Progress: On track  Priority: Medium  Note:   Objective:  Lab Results  Component Value Date   HGBA1C 6.5 (H) 03/07/2021   Lab Results  Component Value Date   CREATININE 1.15 04/18/2021   CREATININE 1.22 (H) 04/11/2021   CREATININE 1.39 (H) 04/10/2021   No results found for: EGFR Current Barriers:  Knowledge Deficits related to basic Diabetes pathophysiology and self care/management New to blood sugar monitoring at home Case Manager Clinical Goal(s):  Over the next 6 months, patient will demonstrate improved adherence to prescribed treatment plan for diabetes self care/management as evidenced by:  daily monitoring/ recording of blood sugars at home adherence to ADA/ carb modified diet  adherence to prescribed medication regimen  contacting provider for new or worsened symptoms or questions Interventions:  Collaboration with Biagio Borg, MD regarding development and update of comprehensive  plan of care as evidenced by provider attestation and co-signature Inter-disciplinary care team collaboration (see longitudinal plan of care) Review of patient status, including review of consultants reports, relevant laboratory and other test results, and medications completed Confirmed with patient that she has continued monitoring/ recording blood sugars at home as previously discussed: positive reinforcement provided; patient continues to feel comfortable checking blood sugars at home and is able to verbalize previously provided education around ideal timing of when to monitor Reviewed with patient all recently recorded blood sugars at home since our last outreach: reports fasting ranges between 102-112 and post-prandial values between 123-148,; reports  fasting blood sugar this morning of 102 Reinforced previously provided education around dietary strategies to consider in setting of DM- patient will require ongoing reinforcement of same; she reports she has been making small changes to her diet which she believes has "really helped" her blood sugars to stay in good range at home Initiated education around A1-C trends and provided verbal education around correlation of blood sugars at home with A1-C levels Initiated education around importance/ rationale for regular foot care: patient reports regularly checks feet at home, verbalizes no concerns Denies recent changes to medications: continues to self manage using weekly pill box Reviewed recent pain management provider office visit 05/30/21- confirmed she will return for injection in (R) knee on 06/27/21- continues to use insurance benefit for transportation to provider appointments Discussed plans with patient for ongoing care management follow up and provided patient with direct contact information for care management team Self-Care Activities Self administers oral medications as prescribed Attends all scheduled provider appointments Patient  Goals: Doristine Devoid job monitoring and recording your blood sugar!  Continue to write down your blood sugars at home first thing in the morning before you eat (fasting) and 2 hours after eating a regular meal- alternating days: today, your reported blood sugar values at home today continue to look great and are right on target Re- check blood sugar if I feel it is too high or too low, or if you don't feel well in general Enter/ record blood sugar readings into daily log- write down your blood sugars on paper in a dedicated place where you can review them easily- we will review your blood sugars each time we talk Take the blood sugar log to all doctor visits- these values will help your doctor make sure you are on the right medication Please read over the educational materials I have sent you periodically- this will help to keep you on target with your blood sugar levels at home; today, I have mailed you a copy of the "Living Well with Diabetes" booklet- this has a lot of great information in it- please begin reviewing and write down any question you have Follow Up Plan:  Telephone follow up appointment with care management team member scheduled for: Tuesday July 15, 2021 at 1:00 pm The patient has been provided with contact information for the care management team and has been advised to call with any health related questions or concerns.      Plan: Telephone follow up appointment with care management team member scheduled for:  Tuesday July 15, 2021 at 1:00 pm The patient has been provided with contact information for the care management team and has been advised to call with any health related questions or concerns.   [name], RN, BSN, [facility] RN Care Coordination- [place] [phone]: direct office [phone]: mobile

## 2021-06-10 NOTE — Patient Instructions (Signed)
Visit [place], it was nice talking with you today.   Please read over the attached information, and keep up the great work monitoring and writing down your daily blood sugars and weights at home: [place]!   I look forward to talking to you again for an update on Tuesday July 15, 2021 at 1:00 pm- please be listening out for my call that day.  I will call as close to 1:00 pm as possible; I look forward to hearing about your progress.   Please don't hesitate to contact me if I can be of assistance to you before our next scheduled appointment.   [name], RN, BSN, [facility] RN Care Coordination- [place] [phone]: direct office [phone]: mobile   PATIENT GOALS:  Goals Addressed             This Visit's Progress    Monitor and Manage My Blood Sugar-Diabetes Type 2   On track    Timeframe:  Long-Range Goal Priority:  Medium Start Date:     04/25/21                        Expected End Date:   10/26/21                    Follow Up Date 07/15/21   Great job monitoring and recording your blood sugar!  Continue to write down your blood sugars at home first thing in the morning before you eat (fasting) and 2 hours after eating a regular meal- alternating days: today, your reported blood sugar values at home today continue to look great and are right on target Re- check blood sugar if I feel it is too high or too low, or if you don't feel well in general Enter/ record blood sugar readings into daily log- write down your blood sugars on paper in a dedicated place where you can review them easily- we will review your blood sugars each time we talk Take the blood sugar log to all doctor visits- these values will help your doctor make sure you are on the right medication Please read over the educational materials I have sent you periodically- this will help to keep you on target with your blood sugar levels at home; today, I  have mailed you a copy of the "Living Well with Diabetes" booklet- this has a lot of great information in it- please begin reviewing and write down any question you have   Why is this important?   Checking your blood sugar at home helps to keep it from getting very high or very low.  Writing the results in a diary or log helps the doctor know how to care for you.  Your blood sugar log should have the time, date and the results.  Also, write down the amount of insulin or other medicine that you take.  Other information, like what you ate, exercise done and how you were feeling, will also be helpful.           Track and Manage Fluids and Swelling-Heart Failure   On track    Timeframe:  Long-Range Goal Priority:  High Start Date:  04/25/21                           Expected End Date:      10/26/21  Follow Up Date 07/15/21   [place] job continuing to monitor and write down you daily weigh at home: today you reported a weight of 357 pounds-- this is a great improvement from your previously reported weights at home Follow the instructions provided to you by your cardiologist on 05/09/21 about when you should take an extra fluid pill along with the extra potassium-- I am glad to hear that you have not needed the extra fluid pill recently  Keep your cardiologist updated if you experience several days of weight gain after taking the extra fluid pill and potassium: if you gain more than 2 pounds in one day or 5 pounds in one week Great job staying on top of your daily weights at home and notifying your care providers when you have weight gain Great job tracking weight in diary or on a calendar: it is VERY important to write your daily weights down on paper Watch for swelling in feet, ankles and legs every day: if you have an increased amount of swelling, make a note of it and let your cardiology provider know-- I am glad to hear that your swelling has improved Keep legs up while  sitting Continue limiting the salt in your diet Stay as active as possible without over-doing: rest if you start to feel tired or "winded"/ short of breath  Why is this important?   It is important to check your weight daily and watch how much salt and liquids you have.  It will help you to manage your heart failure.            Preventing Diabetes Mellitus Complications You can help to prevent or slow down problems that are caused by diabetes (diabetes mellitus). Following your diabetes plan and taking care of yourself can reduce yourrisk of serious or life-threatening complications. What actions can I take to prevent diabetes complications? Diabetes management  Follow instructions from your health care providers about managing your diabetes. Your diabetes may be managed by a team of health care providers who can teach you how to care for yourself and can answer questions that you have. Educate yourself about your condition so you can make healthy choices about eating and physical activity. Know your target range for your blood sugar (glucose), and check your blood glucose level as often as told. Your health care provider will help you decide how often to check your blood glucose level depending on your treatment goals and how well you are meeting them. Ask your health care provider if you should take low-dose aspirin daily and what dose is recommended for you. Taking low-dose aspirin daily is recommended to help prevent cardiovascular disease.  Controlling your blood pressure and cholesterol Your personal target blood pressure is determined based on: Your age. Your medicines. How long you have had diabetes. Any other medical conditions you have. To control your blood pressure: Follow instructions from your health care provider about meal planning, exercise, and medicines. Make sure your health care provider checks your blood pressure at every medical visit. Monitor your blood  pressure at home as told by your health care provider. To control your cholesterol: Follow instructions from your health care provider about meal planning, exercise, and medicines. Have your cholesterol checked at least once a year. You may be prescribed medicine to lower cholesterol (statin). If you are not taking a statin, ask your health care provider if you should be. Controlling your cholesterol may: Help prevent heart disease and stroke. These are the most common  health problems for people with diabetes. Improve your blood flow.  Medical appointments and vaccines Schedule and keep yearly physical exams and eye exams. Your health care provider will tell you how often you need medical visits depending on your diabetes management plan. Keep all follow-up visits as told. This is important so possible problems can be identified early and complications can be avoided or treated. Every visit with your health care provider should include measuring your: Weight. Blood pressure. Blood glucose control. Your A1C (hemoglobin A1C) level should be checked: At least 2 times a year, if you are meeting your treatment goals. 4 times a year, if you are not meeting treatment goals or if your treatment goals have changed. Your blood lipids (lipid profile) should be checked yearly. You should also be checked yearly for protein in your urine (urine microalbumin). If you have type 1 diabetes, get an eye exam 3-5 years after you are diagnosed, and then once a year after your first exam. If you have type 2 diabetes, get an eye exam as soon as you are diagnosed, and then once a year after your first exam. It is also important to keep your vaccines current. It is recommended that you receive: A flu (influenza) vaccine every year. A pneumonia (pneumococcal) vaccine and a hepatitis B vaccine. If you are age 26 or older, you may get the pneumonia vaccine as a series of two separate shots. Ask your health care provider  which other vaccines may be recommended. Lifestyle Do not use any products that contain nicotine or tobacco, such as cigarettes, e-cigarettes, and chewing tobacco. If you need help quitting, ask your health care provider. By avoiding nicotine and tobacco: You will lower your risk for heart attack, stroke, nerve disease, and kidney disease. Your cholesterol and blood pressure may improve. Your blood circulation will improve. If you drink alcohol: Limit how much you use to: 0-1 drink a day for women who are not pregnant. 0-2 drinks a day for men. Be aware of how much alcohol is in your drink. In the [place] one drink equals one 12 oz bottle of beer (355 mL), one 5 oz glass of wine (148 mL), or one 11?2 oz glass of hard liquor (44 mL). Taking care of your feet Diabetes may cause you to have poor blood circulation to your legs and feet. Because of this, taking care of your feet is very important. Diabetes can cause: The skin on the feet to get thinner, break more easily, and heal more slowly. Nerve damage in your legs and feet, which results in decreased feeling. You may not notice minor injuries that could lead to serious problems. To avoid foot problems: Check your skin and feet every day for cuts, bruises, redness, blisters, or sores. Schedule a foot exam with your health care provider once every year. This exam includes: Inspecting the structure and skin of your feet. Checking the pulses and sensation in your feet. Make sure that your health care provider performs a visual foot exam at every medical visit.  Taking care of your teeth People with poorly controlled diabetes are more likely to have gum (periodontal) disease. Diabetes can make periodontal diseases harder to control. If not treated, periodontal diseases can lead to tooth loss. To prevent this: Brush your teeth twice a day. Floss at least once a day. Visit your dentist 2 times a year. Managing stress Living with diabetes can be  stressful. When you are experiencing stress, your blood glucose may be affected in  two ways: Stress hormones may cause your blood glucose to rise. You may be distracted from taking good care of yourself. Be aware of your stress level and make changes to help you manage challenging situations. To lower your stress levels: Consider joining a support group. Do planned relaxation or meditation. Do a hobby that you enjoy. Maintain healthy relationships. Exercise regularly. Work with your health care provider or a mental health professional. Where to find more information American Diabetes Association: www.diabetes.org Association of Diabetes Care and Education Specialists: www.diabeteseducator.org Summary You can take action to prevent or slow down problems that are caused by diabetes (diabetes mellitus). Following your diabetes plan and taking care of yourself can reduce your risk of serious or life-threatening complications. Follow instructions from your health care providers about managing your diabetes. Your diabetes may be managed by a team of health care providers who can teach you how to care for yourself and can answer questions that you have. Know your target range for your blood sugar (glucose), and check your blood glucose levels as often as told. Your health care provider will help you decide how often you should check your blood glucose level depending on your treatment goals and how well you are meeting them. Your health care provider will tell you how often you need medical visits depending on your diabetes management plan. Keep all follow-up visits as directed. This is important so possible problems can be identified early and complications can be avoided or treated. This information is not intended to replace advice given to you by your health care provider. Make sure you discuss any questions you have with your healthcare provider. Document Revised: 01/26/2020 Document Reviewed:  01/26/2020 Elsevier Patient Education  2022 [employer].   The patient verbalized understanding of instructions, educational materials, and care plan provided today and agreed to receive a mailed copy of patient instructions, educational materials, and care plan.  Telephone follow up appointment with care management team member scheduled for:  Tuesday July 15, 2021 at 1:00 pm The patient has been provided with contact information for the care management team and has been advised to call with any health related questions or concerns.   [name], RN, BSN, [facility] RN Care Coordination- [place] [phone]: direct office [phone]: mobile

## 2021-06-11 ENCOUNTER — Other Ambulatory Visit: Payer: Self-pay | Admitting: Internal Medicine

## 2021-06-11 MED ORDER — CLONAZEPAM 1 MG PO TABS: 1.0000 mg | ORAL_TABLET | Freq: Two times a day (BID) | ORAL | 5 refills | Status: DC | PRN

## 2021-06-20 ENCOUNTER — Ambulatory Visit
Admission: RE | Admit: 2021-06-20 | Discharge: 2021-06-20 | Disposition: A | Discharge status: Home / Self Care | Payer: Medicare Other | Source: Ambulatory Visit | Attending: Anesthesiology | Admitting: Anesthesiology

## 2021-06-20 ENCOUNTER — Other Ambulatory Visit: Payer: Self-pay | Admitting: Anesthesiology

## 2021-06-20 ENCOUNTER — Other Ambulatory Visit: Payer: Self-pay

## 2021-06-20 DIAGNOSIS — M1711 Unilateral primary osteoarthritis, right knee: Secondary | ICD-10-CM | POA: Diagnosis not present

## 2021-06-20 DIAGNOSIS — M170 Bilateral primary osteoarthritis of knee: Secondary | ICD-10-CM

## 2021-06-20 DIAGNOSIS — M1712 Unilateral primary osteoarthritis, left knee: Secondary | ICD-10-CM | POA: Diagnosis not present

## 2021-06-21 DIAGNOSIS — G894 Chronic pain syndrome: Secondary | ICD-10-CM | POA: Diagnosis not present

## 2021-06-25 ENCOUNTER — Other Ambulatory Visit: Payer: Self-pay | Admitting: Cardiology

## 2021-07-01 DIAGNOSIS — M1711 Unilateral primary osteoarthritis, right knee: Secondary | ICD-10-CM | POA: Diagnosis not present

## 2021-07-01 DIAGNOSIS — M25561 Pain in right knee: Secondary | ICD-10-CM | POA: Diagnosis not present

## 2021-07-04 DIAGNOSIS — M1711 Unilateral primary osteoarthritis, right knee: Secondary | ICD-10-CM | POA: Diagnosis not present

## 2021-07-04 DIAGNOSIS — M542 Cervicalgia: Secondary | ICD-10-CM | POA: Diagnosis not present

## 2021-07-04 DIAGNOSIS — M25512 Pain in left shoulder: Secondary | ICD-10-CM | POA: Diagnosis not present

## 2021-07-04 DIAGNOSIS — M25552 Pain in left hip: Secondary | ICD-10-CM | POA: Diagnosis not present

## 2021-07-04 DIAGNOSIS — J3089 Other allergic rhinitis: Secondary | ICD-10-CM | POA: Diagnosis not present

## 2021-07-04 DIAGNOSIS — M545 Low back pain, unspecified: Secondary | ICD-10-CM | POA: Diagnosis not present

## 2021-07-04 DIAGNOSIS — G894 Chronic pain syndrome: Secondary | ICD-10-CM | POA: Diagnosis not present

## 2021-07-04 DIAGNOSIS — J321 Chronic frontal sinusitis: Secondary | ICD-10-CM | POA: Diagnosis not present

## 2021-07-04 DIAGNOSIS — M25562 Pain in left knee: Secondary | ICD-10-CM | POA: Diagnosis not present

## 2021-07-04 DIAGNOSIS — M25561 Pain in right knee: Secondary | ICD-10-CM | POA: Diagnosis not present

## 2021-07-04 DIAGNOSIS — M79661 Pain in right lower leg: Secondary | ICD-10-CM | POA: Diagnosis not present

## 2021-07-07 ENCOUNTER — Ambulatory Visit (INDEPENDENT_AMBULATORY_CARE_PROVIDER_SITE_OTHER): Payer: Medicare Other | Admitting: Internal Medicine

## 2021-07-07 ENCOUNTER — Encounter: Payer: Self-pay | Admitting: Internal Medicine

## 2021-07-07 ENCOUNTER — Other Ambulatory Visit: Payer: Self-pay

## 2021-07-07 VITALS — BP 132/80 | HR 62 | Temp 98.3°F | Ht 65.0 in | Wt 360.0 lb

## 2021-07-07 DIAGNOSIS — E538 Deficiency of other specified B group vitamins: Secondary | ICD-10-CM

## 2021-07-07 DIAGNOSIS — L84 Corns and callosities: Secondary | ICD-10-CM

## 2021-07-07 DIAGNOSIS — E1169 Type 2 diabetes mellitus with other specified complication: Secondary | ICD-10-CM | POA: Diagnosis not present

## 2021-07-07 DIAGNOSIS — L609 Nail disorder, unspecified: Secondary | ICD-10-CM | POA: Diagnosis not present

## 2021-07-07 DIAGNOSIS — E559 Vitamin D deficiency, unspecified: Secondary | ICD-10-CM

## 2021-07-07 DIAGNOSIS — R21 Rash and other nonspecific skin eruption: Secondary | ICD-10-CM | POA: Insufficient documentation

## 2021-07-07 DIAGNOSIS — G2581 Restless legs syndrome: Secondary | ICD-10-CM

## 2021-07-07 DIAGNOSIS — I10 Essential (primary) hypertension: Secondary | ICD-10-CM

## 2021-07-07 DIAGNOSIS — E669 Obesity, unspecified: Secondary | ICD-10-CM

## 2021-07-07 DIAGNOSIS — M2550 Pain in unspecified joint: Secondary | ICD-10-CM | POA: Diagnosis not present

## 2021-07-07 LAB — BASIC METABOLIC PANEL
BUN: 22 mg/dL (ref 6–23)
CO2: 32 mEq/L (ref 19–32)
Calcium: 9.2 mg/dL (ref 8.4–10.5)
Chloride: 97 mEq/L (ref 96–112)
Creatinine, Ser: 1.09 mg/dL (ref 0.40–1.20)
GFR: 53.82 mL/min — ABNORMAL LOW (ref >60.00)
Glucose, Bld: 76 mg/dL (ref 70–99)
Potassium: 3.7 mEq/L (ref 3.5–5.1)
Sodium: 138 mEq/L (ref 135–145)

## 2021-07-07 LAB — LIPID PANEL
Cholesterol: 147 mg/dL (ref 0–200)
HDL: 43.3 mg/dL (ref >39.00)
LDL Cholesterol: 76 mg/dL (ref 0–99)
NonHDL: 104.11
Total CHOL/HDL Ratio: 3
Triglycerides: 140 mg/dL (ref 0.0–149.0)
VLDL: 28 mg/dL (ref 0.0–40.0)

## 2021-07-07 LAB — SEDIMENTATION RATE: Sed Rate: 20 mm/hr (ref 0–30)

## 2021-07-07 LAB — HEPATIC FUNCTION PANEL
ALT: 16 U/L (ref 0–35)
AST: 15 U/L (ref 0–37)
Albumin: 4.1 g/dL (ref 3.5–5.2)
Alkaline Phosphatase: 69 U/L (ref 39–117)
Bilirubin, Direct: 0.1 mg/dL (ref 0.0–0.3)
Total Bilirubin: 0.9 mg/dL (ref 0.2–1.2)
Total Protein: 7.2 g/dL (ref 6.0–8.3)

## 2021-07-07 LAB — C-REACTIVE PROTEIN: CRP: <1 mg/dL (ref 0.5–20.0)

## 2021-07-07 LAB — VITAMIN D 25 HYDROXY (VIT D DEFICIENCY, FRACTURES): VITD: 26.86 ng/mL — ABNORMAL LOW (ref 30.00–100.00)

## 2021-07-07 LAB — VITAMIN B12: Vitamin B-12: 244 pg/mL (ref 211–911)

## 2021-07-07 LAB — HEMOGLOBIN A1C: Hgb A1c MFr Bld: 6.1 % (ref 4.6–6.5)

## 2021-07-07 MED ORDER — OZEMPIC (0.25 OR 0.5 MG/DOSE) 2 MG/1.5ML ~~LOC~~ SOPN: 0.5000 mg | PEN_INJECTOR | SUBCUTANEOUS | 0 refills | Status: DC

## 2021-07-07 MED ORDER — ROPINIROLE HCL 1 MG PO TABS: 1.0000 mg | ORAL_TABLET | Freq: Three times a day (TID) | ORAL | 5 refills | Status: DC

## 2021-07-07 MED ORDER — SEMAGLUTIDE (1 MG/DOSE) 4 MG/3ML ~~LOC~~ SOPN: 1.0000 mg | PEN_INJECTOR | SUBCUTANEOUS | 3 refills | Status: DC

## 2021-07-07 MED ORDER — ROPINIROLE HCL 3 MG PO TABS: ORAL_TABLET | ORAL | 5 refills | Status: DC

## 2021-07-07 NOTE — Assessment & Plan Note (Signed)
BP Readings from Last 3 Encounters:  07/07/21 132/80  05/09/21 (!) 128/51  05/08/21 120/62   Stable, pt to continue medical treatment losartan, lopressor

## 2021-07-07 NOTE — Assessment & Plan Note (Signed)
For podiatry referral 

## 2021-07-07 NOTE — Assessment & Plan Note (Signed)
Lab Results  Component Value Date   VITAMINB12 244 07/07/2021   Stable, cont oral replacement - b12 1000 mcg qd

## 2021-07-07 NOTE — Assessment & Plan Note (Signed)
With increaesed symptoms during day hours , for change requip to 1 mg tid,  to f/u any worsening symptoms or concerns

## 2021-07-07 NOTE — Progress Notes (Signed)
Patient ID: Maria [name], female   DOB: 20-Jun-1956, 65 y.o.   MRN: [id number]        Chief Complaint: follow up HTN, facial ? Malar rash, worsening leg discomfort jumping during the day, nail d/o, callous to heels and DM/obesity       HPI:  Maria [name] is a 65 y.o. female here with c/o worsening ? Malar type face rash, was ruled out for lupus several yrs ago per Dr Veva Holes but pt is asking for recheck as that was more than 5 yrs ago.  Has ongoing joint pain, walks with walker,  Pt denies chest pain, increased sob or doe, wheezing, orthopnea, PND, increased LE swelling, palpitations, dizziness or syncope.  Also has worsening jumpy restless leg with discomfort during day and not just at night now.  Also has worening toe nails and heel callous bilateral, not able to really care for that herself due to obesity, asks for podiatry referral.  Also unable to lose wt, but willing to try ozempic for sugar control and wt loss after ads on TV.   Pt denies polydipsia, polyuria, or new focal neuro s/s.  Leg swelling and wt has remained stable on current diuretic.        Wt Readings from Last 3 Encounters:  07/07/21 (!) 360 lb (163.3 kg)  05/09/21 (!) 361 lb (163.7 kg)  05/08/21 (!) 361 lb (163.7 kg)   BP Readings from Last 3 Encounters:  07/07/21 132/80  05/09/21 (!) 128/51  05/08/21 120/62         Past Medical History:  Diagnosis Date   Acute lymphadenitis 2011   ALLERGIC RHINITIS 08/10/2007   ANXIETY 12/06/2007   Atherosclerotic peripheral vascular disease ([place]) 06/13/2013   Aorta on CT June 2014   Cellulitis 2011   Cervical disc disease 03/09/2012   CHF (congestive heart failure) ([place])    Chronic pain 03/09/2012   DEPRESSION 12/06/2007   Diabetes ([place])    DIVERTICULOSIS, [place] 12/06/2007   GERD 12/06/2007   Hepatitis    age 29 hepatitis A   HLD (hyperlipidemia) 05/24/2019   HYPERTENSION 12/06/2007   Lumbar disc disease 03/09/2012   Mesenteric adenitis    MRSA 2006   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 2010   [place]  may 24th 2010   COLONOSCOPY WITH PROPOFOL N/A 07/10/2016   Procedure: COLONOSCOPY WITH PROPOFOL;  Surgeon: Manus Gunning, MD;  Location: [facility];  Service: Gastroenterology;  Laterality: N/A;   COLONOSCOPY WITH PROPOFOL N/A 09/26/2019   Procedure: COLONOSCOPY WITH PROPOFOL;  Surgeon: Yetta Flock, MD;  Location: [facility];  Service: Gastroenterology;  Laterality: N/A;   ESOPHAGOGASTRODUODENOSCOPY (EGD) WITH PROPOFOL N/A 07/10/2016   Procedure: ESOPHAGOGASTRODUODENOSCOPY (EGD) WITH PROPOFOL;  Surgeon: Manus Gunning, MD;  Location: [facility];  Service: Gastroenterology;  Laterality: N/A;   POLYPECTOMY  09/26/2019   Procedure: POLYPECTOMY;  Surgeon: Yetta Flock, MD;  Location: [facility];  Service: Gastroenterology;;   s/p ovary cyst     s/p right knee arthroscopy     Dr. Mardelle Matte ortho    reports that she quit smoking about 12 years ago. She has never used smokeless tobacco. She reports that she does not drink alcohol and does not use drugs. family history includes  Anxiety disorder in her sister; Asthma in her daughter and sister; Bipolar disorder in her daughter; Cancer in her maternal uncle and another family member; Depression in her sister; Diabetes in her mother; Heart disease in her mother; Heart failure in her mother; Hypertension in her father and mother; Liver cancer in her paternal grandmother. Allergies  Allergen Reactions   Amoxicillin-Pot Clavulanate Nausea And Vomiting    Projectile vomiting Did it involve swelling of the face/tongue/throat, SOB, or low BP? No Did it involve sudden or severe rash/hives, skin peeling, or any reaction on the inside of your mouth or nose?  No Did you need to seek medical attention at a hospital or doctor's office? No When did it last happen?      20-30 years If all above answers are "NO", may proceed with cephalosporin use.    Adhesive [Tape] Other (See Comments)    Tears skin off - use paper tape    Codeine Other (See Comments)    hallucinations   Crestor [Rosuvastatin Calcium]     Severe muscle cramps   Lactose Intolerance (Gi) Nausea And Vomiting   Morphine And Related Nausea And Vomiting and Other (See Comments)    Migraine headaches   Vicodin [Hydrocodone-Acetaminophen] Other (See Comments)    hallucinations   Latex Rash   Current Outpatient Medications on File Prior to Visit  Medication Sig Dispense Refill   Accu-Chek Softclix Lancets lancets Use as directed up to 4 times daily (Patient taking differently: 1 each by Other route in the morning, at noon, in the evening, and at bedtime.) 100 each 5   acetaminophen (TYLENOL) 325 MG tablet Take 650 mg by mouth every 6 (six) hours as needed for headache (pain).     albuterol (PROAIR HFA) 108 (90 Base) MCG/ACT inhaler Inhale 2 puffs into the lungs every 6 (six) hours as needed for wheezing or shortness of breath. 1 each 0   Blood Glucose Monitoring Suppl (ACCU-CHEK GUIDE) w/Device KIT Use as directed (Patient taking differently: 1 each by Other route as directed.) 1 kit 0   clonazePAM (KLONOPIN) 1 MG tablet TAKE 1 TABLET BY MOUTH 2 TIMES A DAY AS NEEDED FOR ANXIETY (Patient taking differently: Take 1 mg by mouth 2 (two) times daily as needed for anxiety.) 60 tablet 5   clonazePAM (KLONOPIN) 1 MG tablet Take 1 tablet (1 mg total) by mouth 2 (two) times daily as needed for anxiety. 60 tablet 5   cyclobenzaprine (FLEXERIL) 10 MG tablet TAKE 1 TABLET BY MOUTH 2 TIMES A DAY AS NEEDED FOR MUSCLE SPASMS (Patient taking differently: Take 10 mg by mouth 2 (two) times daily as needed for muscle spasms.) 180 tablet 1   Fexofenadine HCl (ALLEGRA PO) Take by mouth.     glucose blood  (ACCU-CHEK GUIDE) test strip Use as instructed up to 4 times daily (Patient taking differently: Use as instructed up to 4 times daily) 100 each 12   LINZESS 145 MCG CAPS capsule Take 145 mcg by mouth daily as needed (constipation).     loperamide (IMODIUM) 2 MG capsule Take 2 mg by mouth as needed for diarrhea or loose stools.     losartan (COZAAR) 100 MG tablet TAKE ONE TABLET BY MOUTH DAILY 30 tablet 0   metFORMIN (GLUCOPHAGE) 500 MG tablet Take 1 tablet (500 mg total) by mouth 2 (two) times daily with a meal. 60 tablet 1   NARCAN 4 MG/0.1ML LIQD nasal spray kit Place 0.4 mg into the nose daily as needed (  opioid overdose).     ondansetron (ZOFRAN ODT) 4 MG disintegrating tablet Take 1 tablet (4 mg total) by mouth every 4 (four) hours as needed for nausea or vomiting. 20 tablet 0   Oxycodone HCl 10 MG TABS Take 1 tablet (10 mg total) 4 (four) times daily by mouth. Per [facility] (Patient taking differently: Take 10 mg by mouth every 4 (four) hours as needed (pain). Per [facility]) 30 tablet 0   pantoprazole (PROTONIX) 40 MG tablet TAKE ONE TABLET BY MOUTH DAILY (Patient taking differently: Take 40 mg by mouth daily.) 90 tablet 2   polyethylene glycol powder (GLYCOLAX/MIRALAX) 17 GM/SCOOP powder Take 17 g by mouth daily as needed for mild constipation. 510 g 0   Simethicone (GAS-X EXTRA STRENGTH PO) Take 1 tablet by mouth daily as needed (gas).     Tetrahydrozoline HCl (VISINE OP) Place 1 drop into both eyes daily as needed (dry eyes).     metoprolol tartrate (LOPRESSOR) 25 MG tablet Take 1 tablet (25 mg total) by mouth 2 (two) times daily. 180 tablet 3   potassium chloride SA (KLOR-CON) 20 MEQ tablet Take 1 tablet (20 mEq total) by mouth 2 (two) times daily. 180 tablet 3   torsemide (DEMADEX) 20 MG tablet Take 2 tablets (40 mg total) by mouth 2 (two) times daily. 180 tablet 1   [DISCONTINUED] DULoxetine (CYMBALTA) 60 MG capsule Take 60 mg by mouth daily.       No current  facility-administered medications on file prior to visit.        ROS:  All others reviewed and negative.  Objective        PE:  BP 132/80 (BP Location: Right Wrist, Patient Position: Sitting, Cuff Size: Large)   Pulse 62   Temp 98.3 F (36.8 C) (Oral)   Ht '5\' 5"'  (1.651 m)   Wt (!) 360 lb (163.3 kg)   SpO2 98%   BMI 59.91 kg/m                 Constitutional: Pt appears in NAD               HENT: Head: NCAT.                Right Ear: External ear normal.                 Left Ear: External ear normal.                Eyes: . Pupils are equal, round, and reactive to light. Conjunctivae and EOM are normal               Nose: without d/c or deformity               Neck: Neck supple. Gross normal ROM               Cardiovascular: Normal rate and regular rhythm.                 Pulmonary/Chest: Effort normal and breath sounds without rales or wheezing.                Abd:  Soft, NT, ND, + BS, no organomegaly               Neurological: Pt is alert. At baseline orientation, motor grossly intact               Skin: Skin is warm. + erythem nontender facies  but not really malar indistrubtion rashes, no other new lesions, LE edema - 1+ left > right; has callous to heels and long nails               Psychiatric: Pt behavior is normal without agitation   Micro: none  Cardiac tracings I have personally interpreted today:  none  Pertinent Radiological findings (summarize): none   Lab Results  Component Value Date   WBC 7.0 05/01/2021   HGB 12.4 05/01/2021   HCT 37.2 05/01/2021   PLT 274 05/01/2021   GLUCOSE 76 07/07/2021   CHOL 147 07/07/2021   TRIG 140.0 07/07/2021   HDL 43.30 07/07/2021   LDLDIRECT 127.0 01/25/2019   LDLCALC 76 07/07/2021   ALT 16 07/07/2021   AST 15 07/07/2021   NA 138 07/07/2021   K 3.7 07/07/2021   CL 97 07/07/2021   CREATININE 1.09 07/07/2021   BUN 22 07/07/2021   CO2 32 07/07/2021   TSH 1.45 12/31/2020   INR 1.0 04/01/2021   HGBA1C 6.1 07/07/2021    Assessment/Plan:  Maria [name] is a 65 y.o. White or Caucasian [1] female with  has a past medical history of Acute lymphadenitis (2011), ALLERGIC RHINITIS (08/10/2007), ANXIETY (12/06/2007), Atherosclerotic peripheral vascular disease ([place]) (06/13/2013), Cellulitis (2011), Cervical disc disease (03/09/2012), CHF (congestive heart failure) ([place]), Chronic pain (03/09/2012), DEPRESSION (12/06/2007), Diabetes ([place]), DIVERTICULOSIS, [place] (12/06/2007), GERD (12/06/2007), Hepatitis, HLD (hyperlipidemia) (05/24/2019), HYPERTENSION (12/06/2007), Lumbar disc disease (03/09/2012), Mesenteric adenitis, MRSA (2006), 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[place]) (11/08/2017), and THORACIC/LUMBOSACRAL NEURITIS/RADICULITIS UNSPEC (12/28/2008).  Vitamin D deficiency Last vitamin D Lab Results  Component Value Date   VD25OH 26.86 (L) 07/07/2021   Low, to start oral replacement   Vitamin B 12 deficiency Lab Results  Component Value Date   VITAMINB12 244 07/07/2021   Stable, cont oral replacement - b12 1000 mcg qd   Benign hypertension BP Readings from Last 3 Encounters:  07/07/21 132/80  05/09/21 (!) 128/51  05/08/21 120/62   Stable, pt to continue medical treatment losartan, lopressor   Diabetes mellitus type 2 in obese [facility]) Lab Results  Component Value Date   HGBA1C 6.1 07/07/2021   Stable, pt to continue current medical treatment except will change metformin to ozempic if ok with insurance for sugar control and wt loss   Restless legs syndrome With increaesed symptoms during day hours , for change requip to 1 mg tid,  to f/u any worsening symptoms or concerns  Nail disorder For podiatry referral  Pre-ulcerative corn or callous For podiatry referral  Rash Facial rash unclear etoilogy, for rheum labs with labs as ordered,  to f/u any worsening symptoms or concerns  Followup: Return in about 6 months (around 01/07/2022).  Cathlean Cower, MD 07/07/2021 8:57 PM [facility]

## 2021-07-07 NOTE — Assessment & Plan Note (Signed)
Last vitamin D Lab Results  Component Value Date   VD25OH 26.86 (L) 07/07/2021   Low, to start oral replacement

## 2021-07-07 NOTE — Assessment & Plan Note (Signed)
Facial rash unclear etoilogy, for rheum labs with labs as ordered,  to f/u any worsening symptoms or concerns

## 2021-07-07 NOTE — Assessment & Plan Note (Signed)
Lab Results  Component Value Date   HGBA1C 6.1 07/07/2021   Stable, pt to continue current medical treatment except will change metformin to ozempic if ok with insurance for sugar control and wt loss

## 2021-07-07 NOTE — Patient Instructions (Addendum)
Please take all new medication as prescribed - the ozempic  Ok to HOLD the metfomin when you start the ozempic  Ok for change of reqiup to 1 mg  - three times per day  Please continue all other medications as before, and refills have been done if requested.  Please have the pharmacy call with any other refills you may need.  Please continue your efforts at being more active, low cholesterol diet, and weight control.  You are otherwise up to date with prevention measures today.  Please keep your appointments with your specialists as you may have planned  You will be contacted regarding the referral for: podiatry  Please make an Appointment to return in 6 months, or sooner if needed

## 2021-07-08 ENCOUNTER — Encounter: Payer: Self-pay | Admitting: Internal Medicine

## 2021-07-08 ENCOUNTER — Other Ambulatory Visit: Payer: Self-pay | Admitting: Internal Medicine

## 2021-07-08 LAB — URINALYSIS, ROUTINE W REFLEX MICROSCOPIC
Bilirubin Urine: NEGATIVE
Hgb urine dipstick: NEGATIVE
Ketones, ur: NEGATIVE
Nitrite: NEGATIVE
RBC / HPF: NONE SEEN (ref 0–?)
Specific Gravity, Urine: 1.01 (ref 1.000–1.030)
Total Protein, Urine: NEGATIVE
Urine Glucose: NEGATIVE
Urobilinogen, UA: 0.2 (ref 0.0–1.0)
pH: 6 (ref 5.0–8.0)

## 2021-07-08 LAB — RHEUMATOID FACTOR: Rheumatoid fact SerPl-aCnc: 18 IU/mL — ABNORMAL HIGH (ref <14)

## 2021-07-08 LAB — ANTI-DNA ANTIBODY, DOUBLE-STRANDED: ds DNA Ab: <1 IU/mL

## 2021-07-08 LAB — ANA: Anti Nuclear Antibody (ANA): NEGATIVE

## 2021-07-08 LAB — CYCLIC CITRUL PEPTIDE ANTIBODY, IGG: Cyclic Citrullin Peptide Ab: <16 UNITS

## 2021-07-08 MED ORDER — CIPROFLOXACIN HCL 500 MG PO TABS: 500.0000 mg | ORAL_TABLET | Freq: Two times a day (BID) | ORAL | 0 refills | Status: AC

## 2021-07-09 ENCOUNTER — Encounter: Payer: Self-pay | Admitting: Internal Medicine

## 2021-07-15 ENCOUNTER — Ambulatory Visit (INDEPENDENT_AMBULATORY_CARE_PROVIDER_SITE_OTHER): Payer: Medicare Other | Admitting: *Deleted

## 2021-07-15 DIAGNOSIS — E1169 Type 2 diabetes mellitus with other specified complication: Secondary | ICD-10-CM | POA: Diagnosis not present

## 2021-07-15 DIAGNOSIS — I5032 Chronic diastolic (congestive) heart failure: Secondary | ICD-10-CM | POA: Diagnosis not present

## 2021-07-15 DIAGNOSIS — E669 Obesity, unspecified: Secondary | ICD-10-CM

## 2021-07-16 ENCOUNTER — Telehealth: Payer: Self-pay

## 2021-07-16 NOTE — Chronic Care Management (AMB) (Signed)
Chronic Care Management   CCM RN Visit Note  07/16/2021 Name: Maria [name] MRN: [id number] DOB: Aug 24, 1956  Subjective: Maria [name] is a 65 y.o. year old female who is a primary care patient of Biagio Borg, MD. The care management team was consulted for assistance with disease management and care coordination needs.    Engaged with patient by telephone for follow up visit in response to provider referral for case management and/or care coordination services.   Consent to Services:  The patient was given information about Chronic Care Management services, agreed to services, and gave verbal consent prior to initiation of services.  Please see initial visit note for detailed documentation.  Patient agreed to services and verbal consent obtained.   Assessment: Review of patient past medical history, allergies, medications, health status, including review of consultants reports, laboratory and other test data, was performed as part of comprehensive evaluation and provision of chronic care management services.   CCM Care Plan  Allergies  Allergen Reactions   Amoxicillin-Pot Clavulanate Nausea And Vomiting    Projectile vomiting Did it involve swelling of the face/tongue/throat, SOB, or low BP? No Did it involve sudden or severe rash/hives, skin peeling, or any reaction on the inside of your mouth or nose? No Did you need to seek medical attention at a hospital or doctor's office? No When did it last happen?      20-30 years If all above answers are "NO", may proceed with cephalosporin use.    Adhesive [Tape] Other (See Comments)    Tears skin off - use paper tape    Codeine Other (See Comments)    hallucinations   Crestor [Rosuvastatin Calcium]     Severe muscle cramps   Lactose Intolerance (Gi) Nausea And Vomiting   Morphine And Related Nausea And Vomiting and Other (See Comments)    Migraine headaches   Vicodin [Hydrocodone-Acetaminophen] Other (See Comments)     hallucinations   Latex Rash    Outpatient Encounter Medications as of 07/15/2021  Medication Sig   ciprofloxacin (CIPRO) 500 MG tablet Take 1 tablet (500 mg total) by mouth 2 (two) times daily for 10 days.   Accu-Chek Softclix Lancets lancets Use as directed up to 4 times daily (Patient taking differently: 1 each by Other route in the morning, at noon, in the evening, and at bedtime.)   acetaminophen (TYLENOL) 325 MG tablet Take 650 mg by mouth every 6 (six) hours as needed for headache (pain).   albuterol (PROAIR HFA) 108 (90 Base) MCG/ACT inhaler Inhale 2 puffs into the lungs every 6 (six) hours as needed for wheezing or shortness of breath.   Blood Glucose Monitoring Suppl (ACCU-CHEK GUIDE) w/Device KIT Use as directed (Patient taking differently: 1 each by Other route as directed.)   clonazePAM (KLONOPIN) 1 MG tablet TAKE 1 TABLET BY MOUTH 2 TIMES A DAY AS NEEDED FOR ANXIETY (Patient taking differently: Take 1 mg by mouth 2 (two) times daily as needed for anxiety.)   clonazePAM (KLONOPIN) 1 MG tablet Take 1 tablet (1 mg total) by mouth 2 (two) times daily as needed for anxiety.   cyclobenzaprine (FLEXERIL) 10 MG tablet TAKE 1 TABLET BY MOUTH 2 TIMES A DAY AS NEEDED FOR MUSCLE SPASMS (Patient taking differently: Take 10 mg by mouth 2 (two) times daily as needed for muscle spasms.)   Fexofenadine HCl (ALLEGRA PO) Take by mouth.   glucose blood (ACCU-CHEK GUIDE) test strip Use as instructed up to 4 times daily (Patient  taking differently: Use as instructed up to 4 times daily)   LINZESS 145 MCG CAPS capsule Take 145 mcg by mouth daily as needed (constipation).   loperamide (IMODIUM) 2 MG capsule Take 2 mg by mouth as needed for diarrhea or loose stools.   losartan (COZAAR) 100 MG tablet TAKE ONE TABLET BY MOUTH DAILY   metFORMIN (GLUCOPHAGE) 500 MG tablet Take 1 tablet (500 mg total) by mouth 2 (two) times daily with a meal.   metoprolol tartrate (LOPRESSOR) 25 MG tablet Take 1 tablet (25 mg  total) by mouth 2 (two) times daily.   NARCAN 4 MG/0.1ML LIQD nasal spray kit Place 0.4 mg into the nose daily as needed (opioid overdose).   ondansetron (ZOFRAN ODT) 4 MG disintegrating tablet Take 1 tablet (4 mg total) by mouth every 4 (four) hours as needed for nausea or vomiting.   Oxycodone HCl 10 MG TABS Take 1 tablet (10 mg total) 4 (four) times daily by mouth. Per [facility] (Patient taking differently: Take 10 mg by mouth every 4 (four) hours as needed (pain). Per [facility])   pantoprazole (PROTONIX) 40 MG tablet TAKE ONE TABLET BY MOUTH DAILY (Patient taking differently: Take 40 mg by mouth daily.)   polyethylene glycol powder (GLYCOLAX/MIRALAX) 17 GM/SCOOP powder Take 17 g by mouth daily as needed for mild constipation.   potassium chloride SA (KLOR-CON) 20 MEQ tablet Take 1 tablet (20 mEq total) by mouth 2 (two) times daily.   rOPINIRole (REQUIP) 1 MG tablet Take 1 tablet (1 mg total) by mouth 3 (three) times daily.   Semaglutide, 1 MG/DOSE, 4 MG/3ML SOPN Inject 1 mg as directed once a week.   Semaglutide,0.25 or 0.5MG/DOS, (OZEMPIC, 0.25 OR 0.5 MG/DOSE,) 2 MG/1.5ML SOPN Inject 0.5 mg into the skin once a week.   Simethicone (GAS-X EXTRA STRENGTH PO) Take 1 tablet by mouth daily as needed (gas).   Tetrahydrozoline HCl (VISINE OP) Place 1 drop into both eyes daily as needed (dry eyes).   torsemide (DEMADEX) 20 MG tablet Take 2 tablets (40 mg total) by mouth 2 (two) times daily.   [DISCONTINUED] DULoxetine (CYMBALTA) 60 MG capsule Take 60 mg by mouth daily.     No facility-administered encounter medications on file as of 07/15/2021.    Patient Active Problem List   Diagnosis Date Noted   Nail disorder 07/07/2021   Pre-ulcerative corn or callous 07/07/2021   Rash 07/07/2021   AKI (acute kidney injury) ([place]) 78/24/2353   Diastolic congestive heart failure ([place]) 03/17/2021   Diabetes mellitus type 2 in obese ([place]) 61/44/3154   Acute diastolic CHF (congestive heart  failure) ([place]) 03/07/2021   Acute on chronic diastolic (congestive) heart failure ([place]) 03/06/2021   Vitamin D deficiency 01/01/2021   Medial epicondylitis 12/31/2020   Aortic atherosclerosis ([place]) 00/86/7619   Umbilical hernia 50/93/2671   Constipation 08/05/2020   Arthritis 06/12/2020   Hoarseness 05/01/2020   Vitamin B 12 deficiency 01/31/2020   Rosacea 01/31/2020   Neck swelling 01/31/2020   History of colonic polyps    Benign neoplasm of [place]    Nausea 08/31/2019   Throat pain 07/26/2019   HLD (hyperlipidemia) 05/24/2019   Leg cramps 05/24/2019   Lump in neck 05/24/2019   Left thyroid nodule 01/25/2019   Jerking movements of extremities 07/20/2018   Balance disorder 07/20/2018   Right knee pain 03/21/2018   OSA (obstructive sleep apnea) 03/21/2018   Oxygen desaturation 03/21/2018   Urinary symptom or sign 03/21/2018   Acquired  lymphedema 01/25/2018   Gait disorder 01/25/2018   Peripheral edema 01/16/2018   SOB (shortness of breath) 01/15/2018   Dysphonia 12/07/2017   Encounter for well adult exam with abnormal findings 11/08/2017   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   [place] polyp 08/11/2016   Abdominal pain, epigastric    Dysphagia    [place] cancer screening    ACE-inhibitor cough 12/05/2015   Restless legs syndrome 10/08/2015   Venous stasis dermatitis of both lower extremities 04/26/2015   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/17/2014   AR (allergic rhinitis) 10/17/2014   Fatty liver 10/17/2014   Chronic pain syndrome 09/19/2014   Post-traumatic osteoarthritis of both knees 09/19/2014   Spinal stenosis 09/19/2014   Depression, major, single episode, complete remission ([place]) 09/19/2014   Narcotic dependence ([place]) 09/19/2014   Spondylosis of 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 03/09/2012   Lumbar disc disease 03/09/2012   Chronic pain 03/09/2012   Morbid obesity ([place]) 06/17/2011   Chronic low back pain 12/28/2008   Anxiety state 12/06/2007    Depression 12/06/2007   Benign hypertension 12/06/2007   Gastroesophageal reflux disease without esophagitis 12/06/2007   LOW BACK PAIN 12/06/2007   Conditions to be addressed/monitored:  CHF and DMII  Care Plan : Heart Failure (Adult)  Updates made by [name] Royalty, RN since 07/16/2021 12:00 AM     Problem: Symptom Exacerbation (Heart Failure)   Priority: High     Long-Range Goal: Symptom Exacerbation Prevented or Minimized   Start Date: 04/25/2021  Expected End Date: 10/26/2021  This Visit's Progress: On track  Recent Progress: On track  Priority: High  Note:   Current Barriers:  Knowledge deficit related to basic heart failure pathophysiology and self care management Multiple recent hospitalizations for CHF exacerbation Case Manager Clinical Goal(s):  04/25/21: Over the next 6 months, as evidenced by patient reporting during CCM RMN CM outreach, patient will: Monitor and record daily weights at home (notifying MD of 3 lb weight gain over night or 5 lb in a week) Independently verbalize understanding of Heart Failure Action Plan and when to call doctor Take all heart failure mediations as prescribed Interventions:  Collaboration with Biagio Borg, MD regarding development and update of comprehensive plan of care as evidenced by provider attestation and co-signature Inter-disciplinary care team collaboration (see longitudinal plan of care) Chart reviewed including relevant office notes, upcoming scheduled appointments, and lab results Discussed current  clinical condition with patient and confirmed no current clinical or medication concerns; denies shortness of breath, pain, new/ recent falls; denies increased leg swelling, feeling of tightness in chest cavity Patient reports recent increased feelings of "weakness in legs" and "extreme tiredness and sleepiness;" she does not know what is causing this and states she decided to stop taking her diuretic last week for a few days: she  subsequently had an increase in her weight at home from 357- range to 370 pounds today: patient states it is very frustrating and upsetting for her to have to take the fluid pills so often; states she "just stays in the bathroom all day peeing;" we discussed options around timing of her diuretic/ potassium to maximize her quality of life.  She confirms she has resumed taking diuretic after having gained so much weight and I again reiterated the importance of medication adherence- will place CCM Pharmacy team referral for general medication review to address recent period of increased fatigue/ sleepiness, timing of diuretic to promote quality of life  Re- confirmed patient remains able to verbalize  recent instructions from cardiologist 05/09/21 for taking extra diuretic and potassium for weight gain- confirmed she has not taken any extra doses recently Reiterated previously provided printed/ verbal education around CHF zones/ weight gain guidelines; encouraged patient to review printed educational material periodically to keep her on track: she will require ongoing reinforcement and support Confirmed patient continues following low-salt/ low carb/ low sugar diet; positive reinforcement provided Reviewed upcoming provider appointments with patient: new patient podiatry provider office visit 07/17/21; cardiology virtual appointment scheduled for 08/18/21  Self-Care Activities: Patient verbalizes understanding of plan to continue monitoring and recording daily weights at home Self administers medications as prescribed Attends all scheduled provider appointments Calls provider office for new concerns or questions Patient Goals: Doristine Devoid job continuing to monitor and write down you daily weigh at home: today you reported a weight of 370 pounds-- this is a significant increase from the weight ranges you have previously reported: it is very important that you continue to take your fluid pill (diuretic) as prescribed I  have asked the pharmacy team to contact you by telephone to discuss the timing of when you take your fluid pills: please listen out for a call from the pharmacy team Follow the instructions provided to you by your cardiologist on 05/09/21 about when you should take an extra fluid pill along with the extra potassium-- I am glad to hear that you have not needed the extra fluid pill recently  Keep your cardiologist updated if you experience several days of weight gain after taking the extra fluid pill and potassium: if you gain more than 2 pounds in one day or 5 pounds in one week Great job staying on top of your daily weights at home and notifying your care providers when you have weight gain Great job tracking weight in diary or on a calendar: it is VERY important to write your daily weights down on paper Watch for swelling in feet, ankles and legs every day: if you have an increased amount of swelling, make a note of it and let your cardiology provider know-- I am glad to hear that your swelling has improved Keep legs up while sitting Continue limiting the salt in your diet Stay as active as possible without over-doing: rest if you start to feel tired or "winded"/ short of breath Follow Up Plan:  Telephone follow up appointment with care management team member scheduled for: Tuesday August 26, 2021 at 1:00 pm The patient has been provided with contact information for the care management team and has been advised to call with any health related questions or concerns.      Care Plan : Diabetes Type 2 (Adult)  Updates made by [name] Royalty, RN since 07/16/2021 12:00 AM     Problem: Glycemic Management (Diabetes, Type 2)   Priority: Medium     Long-Range Goal: Glycemic Management Optimized   Start Date: 04/25/2021  Expected End Date: 10/26/2021  This Visit's Progress: On track  Recent Progress: On track  Priority: Medium  Note:   Objective:  Lab Results  Component Value Date   HGBA1C 6.5  (H) 03/07/2021   Lab Results  Component Value Date   CREATININE 1.15 04/18/2021   CREATININE 1.22 (H) 04/11/2021   CREATININE 1.39 (H) 04/10/2021   No results found for: EGFR Current Barriers:  Knowledge Deficits related to basic Diabetes pathophysiology and self care/management New to blood sugar monitoring at home Case Manager Clinical Goal(s):  04/25/21: Over the next 6 months, patient will demonstrate  improved adherence to prescribed treatment plan for diabetes self care/management as evidenced by:  daily monitoring/ recording of blood sugars at home adherence to ADA/ carb modified diet  adherence to prescribed medication regimen  contacting provider for new or worsened symptoms or questions Interventions:  Collaboration with Biagio Borg, MD regarding development and update of comprehensive plan of care as evidenced by provider attestation and co-signature Inter-disciplinary care team collaboration (see longitudinal plan of care) Review of patient status, including review of consultants reports, relevant laboratory and other test results, and medications completed Reviewed recent PCP office visit 07/07/21 with patient: positive reinforcement provided around A1-C at target: 6.1-- discussed correlation of average blood sugars at home around most recent A1-C result Confirmed patient obtained newly prescribed Ozempic: she reports the medication was delivered to her by her outpatient pharmacy, and she has questions about how to engage the needles and how to increase staggered dosing as she uses-- will place CCM Pharmacy referral for guidance/ support Confirmed with patient that she has continued monitoring/ recording blood sugars at home as previously discussed: positive reinforcement provided; patient continues to feel comfortable checking blood sugars at home and is able to verbalize previously provided education around ideal timing of when to monitor Reviewed with patient all recently  recorded blood sugars at home since our last outreach: reports fasting ranges between 102-112 and post-prandial values between 123-148; reports fasting blood sugar this morning of 102 Confirmed patient continues previously discussed dietary strategies to consider in setting of DM- patient will require ongoing reinforcement of same; Reinforced previously provided education around importance/ rationale for regular foot care: regularly checks feet at home, obtained referral/ has new patient appointment with podiatry scheduled 07/17/21 Reviewed recent pain management provider office visit- confirmed she attended appointment for injection in (R) knee on 06/27/21- reports injection has "really helped" knee pain Discussed plans with patient for ongoing care management follow up and provided patient with direct contact information for care management team Self-Care Activities Self administers oral medications as prescribed Attends all scheduled provider appointments Patient Goals: Doristine Devoid job monitoring and recording your blood sugar!  Continue to write down your blood sugars at home first thing in the morning before you eat (fasting) and 2 hours after eating a regular meal- alternating days: today, your reported blood sugar values at home today continue to look great and are right on target Re- check blood sugar if I feel it is too high or too low, or if you don't feel well in general Enter/ record blood sugar readings into daily log- write down your blood sugars on paper in a dedicated place where you can review them easily- we will review your blood sugars each time we talk Take the blood sugar log to all doctor visits- these values will help your doctor make sure you are on the right medication Please periodically read over the educational materials I have sent you periodically- this will help to keep you on target with your blood sugar levels at home; the "Living Well with Diabetes" booklet is a great resource  and has a lot of great information in it Your most recent A1-C of 6.1 on 07/07/21 is right on target and represents an average blood sugar over 3 months of approximately 125 I have asked the Pharmacy team to contact you by phone to review your medications and answer your questions about newly prescribed Ozempic- please listen out for a call from the pharmacy team Follow Up Plan:  Telephone follow up appointment  with care management team member scheduled for: Tuesday August 26, 2021 at 1:00 pm The patient has been provided with contact information for the care management team and has been advised to call with any health related questions or concerns.      Plan: Telephone follow up appointment with care management team member scheduled for:  Tuesday August 26, 2021 at 1:00 pm The patient has been provided with contact information for the care management team and has been advised to call with any health related questions or concerns  [name], RN, BSN, [place] [phone]: direct office [phone]: mobile

## 2021-07-16 NOTE — Telephone Encounter (Signed)
-----   Message from [name] Royalty, RN sent at 07/16/2021  9:32 AM EDT ----- [facility] CCM Pharmacy team, Please contact this patient to schedule a phone call with her; she just started Ozempic and has questions about the use of the medication device, as well as the dosing parameters/ guidelines  She recently decided last week that she was going to skip a few days of her diuretic because she is so tired of being in the bathroom so much- she gained a lot weight, and has resumed taking-- but she could benefit from talking with you all about her current scheduling to optimize for quality of life. She could use a general review of her medications, as she reports recent development of significant (and increased/ new) tiredness, weakness, and sleepiness.  Thanks- please call if any questions [name], RN, BSN, [facility] RN Care Coordination- [place] [phone]: direct office [phone]: mobile

## 2021-07-16 NOTE — Patient Instructions (Signed)
[place], it was nice talking with you today.   Please read over the attached information, and listen out for a call from the Pharmacy Team at Dr. Gwynn Burly office   I look forward to talking to you again for an update on Tuesday August 26, 2021 at 1:00 pm- please be listening out for my call that day.  I will call as close to 1:00 pm as possible.  If you need to cancel or re-schedule our telephone visit, please call [phone] and one of our care guides will be happy to assist you.  I look forward to hearing about your progress.   Please don't hesitate to contact me if I can be of assistance to you before our next scheduled appointment.   [name], RN, BSN, [facility] RN Care Coordination- [place] [phone]: direct office [phone]: mobile    PATIENT GOALS:  Goals Addressed             This Visit's Progress    Monitor and Manage My Blood Sugar-Diabetes Type 2   On track    Timeframe:  Long-Range Goal Priority:  Medium Start Date:     04/25/21                        Expected End Date:   10/26/21                    Follow Up Date 08/26/21   Great job monitoring and recording your blood sugar!  Continue to write down your blood sugars at home first thing in the morning before you eat (fasting) and 2 hours after eating a regular meal- alternating days: today, your reported blood sugar values at home today continue to look great and are right on target Re- check blood sugar if I feel it is too high or too low, or if you don't feel well in general Enter/ record blood sugar readings into daily log- write down your blood sugars on paper in a dedicated place where you can review them easily- we will review your blood sugars each time we talk Take the blood sugar log to all doctor visits- these values will help your doctor make sure you are on the right medication Please periodically read over the educational materials I  have sent you periodically- this will help to keep you on target with your blood sugar levels at home; the "Living Well with Diabetes" booklet is a great resource and has a lot of great information in it Your most recent A1-C of 6.1 on 07/07/21 is right on target and represents an average blood sugar over 3 months of approximately 125 I have asked the Pharmacy team to contact you by phone to review your medications and answer your questions about newly prescribed Ozempic- please listen out for a call from the pharmacy team   Why is this important?   Checking your blood sugar at home helps to keep it from getting very high or very low.  Writing the results in a diary or log helps the doctor know how to care for you.  Your blood sugar log should have the time, date and the results.  Also, write down the amount of insulin or other medicine that you take.  Other information, like what you ate, exercise done and how you were feeling, will also be helpful.          Track  and Manage Fluids and Swelling-Heart Failure   On track    Timeframe:  Long-Range Goal Priority:  High Start Date:  04/25/21                           Expected End Date:      10/26/21                 Follow Up Date 08/26/21   Doristine Devoid job continuing to monitor and write down you daily weigh at home: today you reported a weight of 370 pounds-- this is a significant increase from the weight ranges you have previously reported: it is very important that you continue to take your fluid pill (diuretic) as prescribed I have asked the pharmacy team to contact you by telephone to discuss the timing of when you take your fluid pills: please listen out for a call from the pharmacy team Follow the instructions provided to you by your cardiologist on 05/09/21 about when you should take an extra fluid pill along with the extra potassium-- I am glad to hear that you have not needed the extra fluid pill recently  Keep your cardiologist updated if you  experience several days of weight gain after taking the extra fluid pill and potassium: if you gain more than 2 pounds in one day or 5 pounds in one week Great job staying on top of your daily weights at home and notifying your care providers when you have weight gain Great job tracking weight in diary or on a calendar: it is VERY important to write your daily weights down on paper Watch for swelling in feet, ankles and legs every day: if you have an increased amount of swelling, make a note of it and let your cardiology provider know-- I am glad to hear that your swelling has improved Keep legs up while sitting Continue limiting the salt in your diet Stay as active as possible without over-doing: rest if you start to feel tired or "winded"/ short of breath  Why is this important?   It is important to check your weight daily and watch how much salt and liquids you have.  It will help you to manage your heart failure.           Patient verbalizes understanding of instructions provided today and agrees to view in [place].  Telephone follow up appointment with care management team member scheduled for:  Tuesday August 26, 2021 at 1:00 pm The patient has been provided with contact information for the care management team and has been advised to call with any health related questions or concerns.   [name], RN, BSN, [facility] RN Care Coordination- [place] [phone]: direct office [phone]: mobile

## 2021-07-16 NOTE — Chronic Care Management (AMB) (Addendum)
Spoke with patient   Patient had questions about ozempic and how to use the medication  Explained how to use ozempic pen, how to dial back the pen for her intended doses, reviewed with patient appropriate areas for injection - patient was able to successfully inject 0.'25mg'$  of ozempic into her stomach.  Plan will be for patient to increase to 0.'5mg'$  starting next week and continue with 0.'5mg'$  weekly dosing thereafter - Per last PCP note intended plan is for patient to increase to '1mg'$  weekly dosing after completion of initial prescription, plan will be to follow up with patient for initial CCM pharmacy appointment before end of current prescription to discuss all of patients medications and determine if patient is tolerating appropriate and can increase to '1mg'$  weekly dose.  Patient voiced understanding and is agreeable to plan  Tomasa Blase, PharmD Clinical Pharmacist, [place]

## 2021-07-17 ENCOUNTER — Encounter: Payer: Self-pay | Admitting: Podiatry

## 2021-07-17 ENCOUNTER — Ambulatory Visit (INDEPENDENT_AMBULATORY_CARE_PROVIDER_SITE_OTHER): Payer: Medicare Other | Admitting: Podiatry

## 2021-07-17 ENCOUNTER — Other Ambulatory Visit: Payer: Self-pay

## 2021-07-17 DIAGNOSIS — M79674 Pain in right toe(s): Secondary | ICD-10-CM

## 2021-07-17 DIAGNOSIS — B351 Tinea unguium: Secondary | ICD-10-CM | POA: Diagnosis not present

## 2021-07-17 DIAGNOSIS — M79675 Pain in left toe(s): Secondary | ICD-10-CM

## 2021-07-17 NOTE — Progress Notes (Signed)
Subjective:   Patient ID: Maria [name], female   DOB: 65 y.o.   MRN: [id number]   HPI Patient presents with walker who has chronic nail disease 1-5 both feet that are thick and she cannot cut.  She is obese which is complicating factor in the nails get irritated and make it hard for her to wear shoe gear comfortably.  Patient does not smoke currently and is not active   Review of Systems  All other systems reviewed and are negative.      Objective:  Physical Exam Vitals and nursing note reviewed.  Constitutional:      Appearance: She is well-developed.  Pulmonary:     Effort: Pulmonary effort is normal.  Musculoskeletal:        General: Normal range of motion.  Skin:    General: Skin is warm.  Neurological:     Mental Status: She is alert.    Neurovascular status mildly diminished both PT DP pulses with neurological intact.  Patient is found to have thick yellow brittle nailbeds 1-5 both feet that upon dorsi flexion are painful when pressed and make shoe gear difficult to get incurvated in the nailbeds.  Patient does have good digital perfusion is well oriented     Assessment:  Mycotic nail infection 1-5 both feet with discoloration and pain with obesity is complicating factor     Plan:  H&P reviewed condition and debrided nailbeds 1-5 both feet with no iatrogenic bleeding.  Smoothing then accomplished patient will be seen back for routine care

## 2021-07-18 ENCOUNTER — Telehealth: Payer: Self-pay

## 2021-07-18 NOTE — Chronic Care Management (AMB) (Signed)
Chronic Care Management Pharmacy Assistant   Name: Maria [name]  MRN: [id number] DOB: 1956-05-04  Maria [name] is an 65 y.o. year old female who presents for her initial CCM visit with the clinical pharmacist.  Recent office visits:  07/07/21 Cathlean Cower MD(PCP)- Patient was seen for Type 2 Diabetes. Labs were ordered and referral to Podiatry was placed. Patient was started on Semaglutide 0.5 mg subcutaneously once a week. Patients dosage of Ropinirole has increased to 1 mg TID. Follow up in 6 months  05/08/21 Cathlean Cower MD- Patient was seen for Congestive Heart Failure. Patient declined referral to Ortho, prefers to wait to see if symptoms worsen.Patient prefers to wait for cardiology virtual visit for any further advice on changes in treatment. Follow up in 4 months   04/18/21 Cathlean Cower MD- Patient was seen for CHF ER FOLLOW UP. Labs were ordered and advised to follow up in 3 months.  03/17/21 Cathlean Cower MD- Patient was seen for Chronis CHF. Labs were ordered and follow up in 2 weeks.  03/06/21 Cathlean Cower MD- Patient was seen for Leg pain. Patient advised to continue current treatment. No follow up noted.  Recent consult visits:  07/17/21 Tamala Fothergill Regal DPM (Podiatry)- Patient was seen for pain due to Onychomycosis of toenails on both feet. Patient had nails debrided 1-5 both feet with no iatrogenic bleeding. Follow up in 4 months.  05/09/21 Minus Breeding MD(Cardiology, Video)- Patient was seen for ER FOLLOW UP Chronic Congestive heart failure. Follow up in 3 months.  03/28/21 Karsten Ro)- Patient was seen for Chronic pain(Unilateral primary osteoarthritis)  03/23/21 Shanon Ace- Patient was seen for Chronic Pain)  Hospital visits:  Medication Reconciliation was completed by comparing discharge summary, patient's EMR and Pharmacy list, and upon discussion with patient.  1. Admitted to the hospital on 05/01/21 due to Chronic Congestive Heart  Failure. Discharge date was 05/01/21. Discharged from [place]?Medications Started at [facility] Discharge:?? -started None  Medication Changes at Hospital Discharge: -Changed None  Medications Discontinued at Hospital Discharge: -Stopped None Medications that remain the same after Hospital Discharge:??  -All other medications will remain the same.    2. Admitted to the hospital on 04/05/21 due to Acute Kidney Injury Discharge date was 04/11/21. Discharged from [place]?Medications Started at [facility] Discharge:?? -started magnesium oxide, metoprolol tartrate , polyethylene glycol powder, and potassium chloride   Medication Changes at Hospital Discharge: -Changed None  Medications Discontinued at Hospital Discharge: -Stopped cephALEXin 500 MG  furosemide 20 MG  metolazone 2.5 MG   potassium chloride 10 MEQ CR  Medications that remain the same after Hospital Discharge:??  -All other medications will remain the same.    3. Admitted to the hospital on 04/01/21 due to Chronic Venous Stasis Discharge date was 04/01/21. Discharged from [place]?Medications Started at [facility] Discharge:?? -started None  Medication Changes at Hospital Discharge: -Changed None  Medications Discontinued at Hospital Discharge: -Stopped None Medications that remain the same after Hospital Discharge:??  -All other medications will remain the same.    4. Admitted to the hospital on 03/06/21 due to Acute on Chronic Congestive Heart Failure. Discharge date was 03/11/21. Discharged from [place]?Medications Started at [facility] Discharge:?? -started metformin  Medication Changes at Hospital Discharge: -Changed Furosemide and Ropinirole  Medications Discontinued at Hospital Discharge: -Stopped None Medications that remain the same after  Hospital Discharge:??  -All other medications will remain  the same.     Medications: Outpatient Encounter Medications as of 07/18/2021  Medication Sig   Accu-Chek Softclix Lancets lancets Use as directed up to 4 times daily (Patient taking differently: 1 each by Other route in the morning, at noon, in the evening, and at bedtime.)   acetaminophen (TYLENOL) 325 MG tablet Take 650 mg by mouth every 6 (six) hours as needed for headache (pain).   albuterol (PROAIR HFA) 108 (90 Base) MCG/ACT inhaler Inhale 2 puffs into the lungs every 6 (six) hours as needed for wheezing or shortness of breath.   Blood Glucose Monitoring Suppl (ACCU-CHEK GUIDE) w/Device KIT Use as directed (Patient taking differently: 1 each by Other route as directed.)   ciprofloxacin (CIPRO) 500 MG tablet Take 1 tablet (500 mg total) by mouth 2 (two) times daily for 10 days.   clonazePAM (KLONOPIN) 1 MG tablet TAKE 1 TABLET BY MOUTH 2 TIMES A DAY AS NEEDED FOR ANXIETY (Patient taking differently: Take 1 mg by mouth 2 (two) times daily as needed for anxiety.)   clonazePAM (KLONOPIN) 1 MG tablet Take 1 tablet (1 mg total) by mouth 2 (two) times daily as needed for anxiety.   cyclobenzaprine (FLEXERIL) 10 MG tablet TAKE 1 TABLET BY MOUTH 2 TIMES A DAY AS NEEDED FOR MUSCLE SPASMS (Patient taking differently: Take 10 mg by mouth 2 (two) times daily as needed for muscle spasms.)   Fexofenadine HCl (ALLEGRA PO) Take by mouth.   glucose blood (ACCU-CHEK GUIDE) test strip Use as instructed up to 4 times daily (Patient taking differently: Use as instructed up to 4 times daily)   LINZESS 145 MCG CAPS capsule Take 145 mcg by mouth daily as needed (constipation).   loperamide (IMODIUM) 2 MG capsule Take 2 mg by mouth as needed for diarrhea or loose stools.   losartan (COZAAR) 100 MG tablet TAKE ONE TABLET BY MOUTH DAILY   metFORMIN (GLUCOPHAGE) 500 MG tablet Take 1 tablet (500 mg total) by mouth 2 (two) times daily with a meal.   metoprolol tartrate (LOPRESSOR) 25 MG tablet Take 1 tablet (25 mg total)  by mouth 2 (two) times daily.   NARCAN 4 MG/0.1ML LIQD nasal spray kit Place 0.4 mg into the nose daily as needed (opioid overdose).   ondansetron (ZOFRAN ODT) 4 MG disintegrating tablet Take 1 tablet (4 mg total) by mouth every 4 (four) hours as needed for nausea or vomiting.   Oxycodone HCl 10 MG TABS Take 1 tablet (10 mg total) 4 (four) times daily by mouth. Per [facility] (Patient taking differently: Take 10 mg by mouth every 4 (four) hours as needed (pain). Per [facility])   pantoprazole (PROTONIX) 40 MG tablet TAKE ONE TABLET BY MOUTH DAILY (Patient taking differently: Take 40 mg by mouth daily.)   polyethylene glycol powder (GLYCOLAX/MIRALAX) 17 GM/SCOOP powder Take 17 g by mouth daily as needed for mild constipation.   potassium chloride SA (KLOR-CON) 20 MEQ tablet Take 1 tablet (20 mEq total) by mouth 2 (two) times daily.   rOPINIRole (REQUIP) 1 MG tablet Take 1 tablet (1 mg total) by mouth 3 (three) times daily.   Semaglutide, 1 MG/DOSE, 4 MG/3ML SOPN Inject 1 mg as directed once a week.   Semaglutide,0.25 or 0.5MG/DOS, (OZEMPIC, 0.25 OR 0.5 MG/DOSE,) 2 MG/1.5ML SOPN Inject 0.5 mg into the skin once a week.   Simethicone (GAS-X EXTRA STRENGTH PO) Take 1 tablet by mouth daily as needed (  gas).   Tetrahydrozoline HCl (VISINE OP) Place 1 drop into both eyes daily as needed (dry eyes).   torsemide (DEMADEX) 20 MG tablet Take 2 tablets (40 mg total) by mouth 2 (two) times daily.   [DISCONTINUED] DULoxetine (CYMBALTA) 60 MG capsule Take 60 mg by mouth daily.     No facility-administered encounter medications on file as of 07/18/2021.  Current Medication List acetaminophen 325 MG  albuterol 108 (90 Base) MCG/ACT last filled 11/05/20 25 DS ciprofloxacin 500 MG last filled 07/08/21 10 DS Clonazepam 1 MG last filled 07/10/2021 30 DS cyclobenzaprine 10 MG tablet last filled 02/28/21 90 DS Fexofenadine HCl  Loperamide 2 MG capsule losartan 100 MG tablet last filled 05/12/21 30  DS NARCAN 4 MG/0.1 last filled 02/22/21 14 DS ondansetron 4 MG last filled 01/02/21 7 DS Oxycodone HCl 10 MG last filled 12/29/20 30 DS Pantoprazole 40 MG polyethylene glycol 17 GM ropinirole 3 MG last filled 07/09/21 30 DS Semaglutide,0.25 or 0.5MG last filled 07/07/21 28 DS  Simethicone Tetrahydrozoline HCl     [place] Pharmacist Assistant [phone]

## 2021-07-21 DIAGNOSIS — G894 Chronic pain syndrome: Secondary | ICD-10-CM | POA: Diagnosis not present

## 2021-07-28 ENCOUNTER — Telehealth: Payer: Self-pay | Admitting: Internal Medicine

## 2021-07-28 NOTE — Telephone Encounter (Signed)
Provider prescribed patient Ozempic  Patient says shes been taking it she has been experiencing headache on left side, nausea, diarrhea, jitterness w/ both hands  Wants to know what she needs to do about this  Req callback [phone]

## 2021-07-28 NOTE — Telephone Encounter (Signed)
Unfortunately this can be from the ozempic and would stop if the ozempic is stopped  We can try a similar medication such as trulicity, bydureon, or rybelsus, but please ask pt to check with insurance as these may not be covered

## 2021-07-28 NOTE — Telephone Encounter (Signed)
Ok to continue the metformin as we never meant to stop it I believe  Not sure why the headache, and not sure what to do about it if anything, except tylenol as needed

## 2021-07-30 NOTE — Telephone Encounter (Signed)
Called pt and LDVM for pt to call the office with which medication her insurance will cover to replace the Ozempic (trulicity, bydureon, or rybelsus)... along with if she would like to make an appointment to see Dr. [name] Reichmann to further discuss her headaches.

## 2021-07-30 NOTE — Telephone Encounter (Signed)
   Patient calling to report her insurance will cover alternative medications, however the prices given were too costly. $300+ Patient declined alternative to be prescribed

## 2021-07-31 DIAGNOSIS — Z79891 Long term (current) use of opiate analgesic: Secondary | ICD-10-CM | POA: Diagnosis not present

## 2021-07-31 DIAGNOSIS — M79641 Pain in right hand: Secondary | ICD-10-CM | POA: Diagnosis not present

## 2021-07-31 DIAGNOSIS — M25561 Pain in right knee: Secondary | ICD-10-CM | POA: Diagnosis not present

## 2021-07-31 DIAGNOSIS — J3089 Other allergic rhinitis: Secondary | ICD-10-CM | POA: Diagnosis not present

## 2021-07-31 DIAGNOSIS — M25562 Pain in left knee: Secondary | ICD-10-CM | POA: Diagnosis not present

## 2021-07-31 DIAGNOSIS — M545 Low back pain, unspecified: Secondary | ICD-10-CM | POA: Diagnosis not present

## 2021-07-31 DIAGNOSIS — M542 Cervicalgia: Secondary | ICD-10-CM | POA: Diagnosis not present

## 2021-07-31 DIAGNOSIS — M1711 Unilateral primary osteoarthritis, right knee: Secondary | ICD-10-CM | POA: Diagnosis not present

## 2021-07-31 DIAGNOSIS — M25552 Pain in left hip: Secondary | ICD-10-CM | POA: Diagnosis not present

## 2021-07-31 DIAGNOSIS — M25551 Pain in right hip: Secondary | ICD-10-CM | POA: Diagnosis not present

## 2021-07-31 DIAGNOSIS — M25512 Pain in left shoulder: Secondary | ICD-10-CM | POA: Diagnosis not present

## 2021-07-31 DIAGNOSIS — G894 Chronic pain syndrome: Secondary | ICD-10-CM | POA: Diagnosis not present

## 2021-07-31 DIAGNOSIS — J321 Chronic frontal sinusitis: Secondary | ICD-10-CM | POA: Diagnosis not present

## 2021-07-31 NOTE — Progress Notes (Signed)
Chronic Care Management Pharmacy Note  08/01/2021 Name:  Aljean Horiuchi Kafer MRN:  [id number] DOB:  [birth year]  Summary: - Patient reports that she recently has stopped losartan due to hypotension (blood pressures averaging 110/40's) - since stopping reports that blood pressure has been averaging 120/60's with a HR in the 60's - Has stopped ozempic due to developing severe headache / issues with nausea and diarrhea, has since subsided - continues metformin for BG control which has been averaging 90-110 - denies issues with low blood sugars -Continues to follow with Dr. Lennox Grumbles for pain management - has been using oxycodone, flexeril, and tylenol with success, notes to worsening left ankle pain, but is gradually improving -Reports to about once daily use of clonazepam for anxiety, previously had been following with a counselor but has not met with that counselor in a number of months, is interested in establishing with a new counselor and to work to discontinue clonazepam (due to improved safety with use of oxycodone/ cyclobenzaprine for pain) -Has increased vitamin D to 4000 units daily since last PCP visit, vitamin D level has not been rechecked since increase   Recommendations/Changes made from today's visit: - Continue to monitor BP and BG daily and reach out should they be elevated from goal - Reduce clonazepam to 1/2 tablet daily if needed, start following with new counselor to help better control anxiety  -Recheck Vitamin D levels with next lab appointment , once vitamin D level is in normal range, would recommend starting pravastatin 68m daily to lower LDL due to history of DM2, HTN, and HF  Subjective: BShamari TrostelColon is an 65y.o. year old female who is a primary patient of JJenny Reichmann JHunt Oris MD.  The CCM team was consulted for assistance with disease management and care coordination needs.    Engaged with patient by telephone for initial visit in response to provider referral for pharmacy case  management and/or care coordination services.   Consent to Services:  The patient was given the following information about Chronic Care Management services today, agreed to services, and gave verbal consent: 1. CCM service includes personalized support from designated clinical staff supervised by the primary care provider, including individualized plan of care and coordination with other care providers 2. 24/7 contact phone numbers for assistance for urgent and routine care needs. 3. Service will only be billed when office clinical staff spend 20 minutes or more in a month to coordinate care. 4. Only one practitioner may furnish and bill the service in a calendar month. 5.The patient may stop CCM services at any time (effective at the end of the month) by phone call to the office staff. 6. The patient will be responsible for cost sharing (co-pay) of up to 20% of the service fee (after annual deductible is met). Patient agreed to services and consent obtained.  Patient Care Team: JBiagio Borg MD as PCP - General (Internal Medicine) HMinus Breeding MD as PCP - Cardiology (Cardiology) MHelayne Seminole MD as Consulting Physician (Otolaryngology) SArtis Delay MD as Referring Physician (Internal Medicine) TKnox Royalty RN as Case Manager  Recent office visits:  07/07/21 JCathlean CowerMD(PCP)- Patient was seen for Type 2 Diabetes. Labs were ordered and referral to Podiatry was placed. Patient was started on Semaglutide 0.5 mg subcutaneously once a week. Patients dosage of Ropinirole has increased to 1 mg TID. Follow up in 6 months   05/08/21 JCathlean CowerMD- Patient was seen for Congestive Heart Failure. Patient declined referral  to Ortho, prefers to wait to see if symptoms worsen.Patient prefers to wait for cardiology virtual visit for any further advice on changes in treatment. Follow up in 4 months    04/18/21 Cathlean Cower MD- Patient was seen for CHF ER FOLLOW UP. Labs were ordered and advised to  follow up in 3 months.   03/17/21 Cathlean Cower MD- Patient was seen for Chronis CHF. Labs were ordered and follow up in 2 weeks.   03/06/21 Cathlean Cower MD- Patient was seen for Leg pain. Patient advised to continue current treatment. No follow up noted.   Recent consult visits:  07/17/21 Tamala Fothergill Regal DPM (Podiatry)- Patient was seen for pain due to Onychomycosis of toenails on both feet. Patient had nails debrided 1-5 both feet with no iatrogenic bleeding. Follow up in 4 months.   05/09/21 Minus Breeding MD(Cardiology, Video)- Patient was seen for ER FOLLOW UP Chronic Congestive heart failure. Follow up in 3 months.   03/28/21 Karsten Ro)- Patient was seen for Chronic pain(Unilateral primary osteoarthritis)   03/23/21 Shanon Ace- Patient was seen for Chronic Pain)   Hospital visits:  Medication Reconciliation was completed by comparing discharge summary, patient's EMR and Pharmacy list, and upon discussion with patient.   1. Admitted to the hospital on 05/01/21 due to Chronic Congestive Heart Failure. Discharge date was 05/01/21. Discharged from [place]?Medications Started at [facility] Discharge:?? -started None   Medication Changes at Hospital Discharge: -Changed None   Medications Discontinued at Hospital Discharge: -Stopped None Medications that remain the same after Hospital Discharge:??  -All other medications will remain the same.     2. Admitted to the hospital on 04/05/21 due to Acute Kidney Injury Discharge date was 04/11/21. Discharged from [place]?Medications Started at [facility] Discharge:?? -started torsemide, magnesium oxide, metoprolol tartrate , polyethylene glycol powder, and potassium chloride    Medication Changes at Hospital Discharge: -Changed None   Medications Discontinued at Hospital Discharge: -Stopped cephALEXin 500 MG  furosemide 20 MG  metolazone 2.5 MG    potassium chloride 10 MEQ CR  Medications that remain the same after Hospital Discharge:??  -All other medications will remain the same.     3. Admitted to the hospital on 04/01/21 due to Chronic Venous Stasis Discharge date was 04/01/21. Discharged from [place]?Medications Started at [facility] Discharge:?? -started None   Medication Changes at Hospital Discharge: -Changed None   Medications Discontinued at Hospital Discharge: -Stopped None Medications that remain the same after Hospital Discharge:??  -All other medications will remain the same.     4. Admitted to the hospital on 03/06/21 due to Acute on Chronic Congestive Heart Failure. Discharge date was 03/11/21. Discharged from [place]?Medications Started at [facility] Discharge:?? -started metformin   Medication Changes at Hospital Discharge: -Changed Furosemide (increased to 1m BID) and Ropinirole   Medications Discontinued at Hospital Discharge: -Stopped None Medications that remain the same after Hospital Discharge:??  -All other medications will remain the same.    Objective:  Lab Results  Component Value Date   CREATININE 1.09 07/07/2021   BUN 22 07/07/2021   GFR 53.82 (L) 07/07/2021   GFRNONAA 48 (L) 05/01/2021   GFRAA >60 08/06/2020   NA 138 07/07/2021   K 3.7 07/07/2021   CALCIUM 9.2 07/07/2021   CO2 32 07/07/2021   GLUCOSE 76 07/07/2021  Lab Results  Component Value Date/Time   HGBA1C 6.1 07/07/2021 03:10 PM   HGBA1C 6.5 (H) 03/07/2021 05:15 AM   GFR 53.82 (L) 07/07/2021 03:10 PM   GFR 50.54 (L) 04/18/2021 12:18 PM    Last diabetic Eye exam:  Lab Results  Component Value Date/Time   HMDIABEYEEXA No Retinopathy 05/14/2021 11:13 AM    Last diabetic Foot exam:  No results found for: HMDIABFOOTEX   Lab Results  Component Value Date   CHOL 147 07/07/2021   HDL 43.30 07/07/2021   LDLCALC 76 07/07/2021   LDLDIRECT 127.0 01/25/2019    TRIG 140.0 07/07/2021   CHOLHDL 3 07/07/2021    Hepatic Function Latest Ref Rng & Units 07/07/2021 05/01/2021 04/18/2021  Total Protein 6.0 - 8.3 g/dL 7.2 7.3 7.7  Albumin 3.5 - 5.2 g/dL 4.1 4.0 4.3  AST 0 - 37 U/L '15 23 16  ' ALT 0 - 35 U/L '16 22 22  ' Alk Phosphatase 39 - 117 U/L 69 63 72  Total Bilirubin 0.2 - 1.2 mg/dL 0.9 1.1 0.9  Bilirubin, Direct 0.0 - 0.3 mg/dL 0.1 - 0.1    Lab Results  Component Value Date/Time   TSH 1.45 12/31/2020 02:04 PM   TSH 1.08 01/31/2020 04:39 PM    CBC Latest Ref Rng & Units 05/01/2021 04/18/2021 04/11/2021  WBC 4.0 - 10.5 K/uL 7.0 8.3 7.6  Hemoglobin 12.0 - 15.0 g/dL 12.4 12.9 11.4(L)  Hematocrit 36.0 - 46.0 % 37.2 38.0 35.0(L)  Platelets 150 - 400 K/uL 274 290.0 260    Lab Results  Component Value Date/Time   VD25OH 26.86 (L) 07/07/2021 03:10 PM   VD25OH 24.56 (L) 12/31/2020 02:04 PM    Clinical ASCVD: No  The 10-year ASCVD risk score Mikey Bussing DC Jr., et al., 2013) is: 12.5%   Values used to calculate the score:     Age: 57 years     Sex: Female     Is Non-Hispanic African American: No     Diabetic: Yes     Tobacco smoker: No     Systolic Blood Pressure: 694 mmHg     Is BP treated: Yes     HDL Cholesterol: 43.3 mg/dL     Total Cholesterol: 147 mg/dL    Depression screen [facility] 2/9 04/25/2021 12/31/2020 09/20/2020  Decreased Interest 0 0 0  Down, Depressed, Hopeless 0 0 0  PHQ - 2 Score 0 0 0  Altered sleeping - - 0  Tired, decreased energy - - 0  Change in appetite - - 0  Feeling bad or failure about yourself  - - 0  Trouble concentrating - - 0  Moving slowly or fidgety/restless - - 0  Suicidal thoughts - - 0  PHQ-9 Score - - 0  Difficult doing work/chores - - -  Some recent data might be hidden     Social History   Tobacco Use  Smoking Status Former   Years: 30.00   Types: Cigarettes   Quit date: 11/08/2008   Years since quitting: 12.7  Smokeless Tobacco Never  Tobacco Comments   quit 10/09   BP Readings from Last 3  Encounters:  07/07/21 132/80  05/09/21 (!) 128/51  05/08/21 120/62   Pulse Readings from Last 3 Encounters:  07/07/21 62  05/09/21 72  05/08/21 71   Wt Readings from Last 3 Encounters:  07/07/21 (!) 360 lb (163.3 kg)  05/09/21 (!) 361 lb (163.7 kg)  05/08/21 (!) 361 lb (163.7 kg)   BMI Readings from Last  3 Encounters:  07/07/21 59.91 kg/m  05/09/21 60.07 kg/m  05/08/21 60.07 kg/m    Assessment/Interventions: Review of patient past medical history, allergies, medications, health status, including review of consultants reports, laboratory and other test data, was performed as part of comprehensive evaluation and provision of chronic care management services.   SDOH:  (Social Determinants of Health) assessments and interventions performed: Yes  SDOH Screenings   Alcohol Screen: Low Risk    Last Alcohol Screening Score (AUDIT): 0  Depression (PHQ2-9): Low Risk    PHQ-2 Score: 0  Financial Resource Strain: Medium Risk   Difficulty of Paying Living Expenses: Somewhat hard  Food Insecurity: No Food Insecurity   Worried About Charity fundraiser in the Last Year: Never true   Ran Out of Food in the Last Year: Never true  Housing: Low Risk    Last Housing Risk Score: 0  Physical Activity: Inactive   Days of Exercise per Week: 0 days   Minutes of Exercise per Session: 0 min  Social Connections: Unknown   Frequency of Communication with Friends and Family: More than three times a week   Frequency of Social Gatherings with Friends and Family: More than three times a week   Attends Religious Services: Patient refused   Marine scientist or Organizations: Patient refused   Attends Music therapist: Patient refused   Marital Status: Never married  Stress: No Stress Concern Present   Feeling of Stress : Not at all  Tobacco Use: Medium Risk   Smoking Tobacco Use: Former   Smokeless Tobacco Use: Never  Transportation Needs: No Arboriculturist (Medical): No   Lack of Transportation (Non-Medical): No    CCM Care Plan  Allergies  Allergen Reactions   Amoxicillin-Pot Clavulanate Nausea And Vomiting    Projectile vomiting Did it involve swelling of the face/tongue/throat, SOB, or low BP? No Did it involve sudden or severe rash/hives, skin peeling, or any reaction on the inside of your mouth or nose? No Did you need to seek medical attention at a hospital or doctor's office? No When did it last happen?      20-30 years If all above answers are "NO", may proceed with cephalosporin use.    Adhesive [Tape] Other (See Comments)    Tears skin off - use paper tape    Codeine Other (See Comments)    hallucinations   Crestor [Rosuvastatin Calcium]     Severe muscle cramps   Lactose Intolerance (Gi) Nausea And Vomiting   Morphine And Related Nausea And Vomiting and Other (See Comments)    Migraine headaches   Vicodin [Hydrocodone-Acetaminophen] Other (See Comments)    hallucinations   Latex Rash    Medications Reviewed Today     Reviewed by Tomasa Blase, [facility] (Pharmacist) on 08/01/21 at 1639  Med List Status: <None>   Medication Order Taking? Sig Documenting Provider Last Dose Status Informant  Accu-Chek Softclix Lancets lancets [id number] Yes Use as directed up to 4 times daily  Patient taking differently: 1 each by Other route in the morning, at noon, in the evening, and at bedtime.   Elodia Florence., MD Taking Active   acetaminophen (TYLENOL) 325 MG tablet [id number] Yes Take 650 mg by mouth every 6 (six) hours as needed for headache (pain). [provider] Taking Active Self  albuterol (PROAIR HFA) 108 (90 Base) MCG/ACT inhaler [id number] Yes Inhale 2 puffs into the lungs every 6 (six)  hours as needed for wheezing or shortness of breath. Lucretia Kern, DO Taking Active Self           Med Note [place] May 01, 2021  2:27 PM)    Blood Glucose Monitoring Suppl (ACCU-CHEK  GUIDE) w/Device KIT [id number] Yes Use as directed  Patient taking differently: 1 each by Other route as directed.   Elodia Florence., MD Taking Active   Cholecalciferol (HM VITAMIN D3) 100 MCG (4000 UT) CAPS [id number] Yes Take 4,000 Units by mouth daily. [provider] Taking Active   clonazePAM (KLONOPIN) 1 MG tablet [id number] Yes TAKE 1 TABLET BY MOUTH 2 TIMES A DAY AS NEEDED FOR ANXIETY  Patient taking differently: Take 1 mg by mouth 2 (two) times daily as needed for anxiety.   Biagio Borg, MD Taking Active            Med Note [place] May 01, 2021  2:28 PM)    cyclobenzaprine (FLEXERIL) 10 MG tablet [id number] Yes TAKE 1 TABLET BY MOUTH 2 TIMES A DAY AS NEEDED FOR MUSCLE SPASMS  Patient taking differently: Take 10 mg by mouth 2 (two) times daily as needed for muscle spasms.   Biagio Borg, MD Taking Active            Med Note [place] May 01, 2021  2:28 PM)      Discontinued 03/09/12 1633   Fexofenadine HCl (ALLEGRA PO) [id number] Yes Take 180 mg by mouth daily. [provider] Taking Active   glucose blood (ACCU-CHEK GUIDE) test strip [id number] Yes Use as instructed up to 4 times daily  Patient taking differently: Use as instructed up to 4 times daily   Elodia Florence., MD Taking Active   LINZESS 145 MCG CAPS capsule [id number] Yes Take 145 mcg by mouth daily as needed (constipation). [provider] Taking Active Self           Med Note [place] May 01, 2021  2:29 PM)    loperamide (IMODIUM) 2 MG capsule [id number] Yes Take 2 mg by mouth as needed for diarrhea or loose stools. [provider] Taking Active Self           Med Note [place] May 01, 2021  2:29 PM)    losartan (COZAAR) 100 MG tablet [id number] No TAKE ONE TABLET BY MOUTH DAILY  Patient not taking: Reported on 08/01/2021   Biagio Borg, MD Not Taking Active   metFORMIN (GLUCOPHAGE) 500 MG tablet  [id number] Yes Take 1 tablet (500 mg total) by mouth 2 (two) times daily with a meal. Biagio Borg, MD Taking Active   metoprolol tartrate (LOPRESSOR) 25 MG tablet [id number] Yes Take 1 tablet (25 mg total) by mouth 2 (two) times daily. Minus Breeding, MD Taking Active   NARCAN 4 MG/0.1ML LIQD nasal spray kit [id number] No Place 0.4 mg into the nose daily as needed (opioid overdose).  Patient not taking: Reported on 08/01/2021   [provider] Not Taking Active Self           Med Note [place] May 01, 2021  2:31 PM)    ondansetron (ZOFRAN ODT) 4 MG disintegrating tablet [id number] Yes Take 1 tablet (4 mg total) by mouth every 4 (four) hours as needed for nausea or vomiting. Charlesetta Shanks, MD Taking  Active Self           Med Note [place] May 01, 2021  2:31 PM)    Oxycodone HCl 10 MG TABS [id number] Yes Take 1 tablet (10 mg total) 4 (four) times daily by mouth. Per [facility]  Patient taking differently: Take 10 mg by mouth every 4 (four) hours as needed (pain). Per [facility]   Biagio Borg, MD Taking Active Self           Med Note Pollie Meyer NICKS, [facility]   Wed Aug 23, 2019  4:15 PM)    pantoprazole (PROTONIX) 40 MG tablet [id number] Yes TAKE ONE TABLET BY MOUTH DAILY  Patient taking differently: Take 40 mg by mouth daily.   Biagio Borg, MD Taking Active   Polyethyl Glycol-Propyl Glycol 0.4-0.3 % Bailey Mech [id number] Yes Apply 1 drop to eye daily as needed. [provider] Taking Active   polyethylene glycol powder (GLYCOLAX/MIRALAX) 17 GM/SCOOP powder [id number] No Take 17 g by mouth daily as needed for mild constipation.  Patient not taking: Reported on 08/01/2021   Elodia Florence., MD Not Taking Active Self           Med Note [place] May 01, 2021  2:32 PM)    potassium chloride SA (KLOR-CON) 20 MEQ tablet [id number] Yes Take 1 tablet (20 mEq total) by mouth 2 (two) times daily. Minus Breeding,  MD Taking Active   rOPINIRole (REQUIP) 1 MG tablet [id number] Yes Take 1 tablet (1 mg total) by mouth 3 (three) times daily. Biagio Borg, MD Taking Active   Simethicone (GAS-X EXTRA STRENGTH PO) [id number] Yes Take 1 tablet by mouth daily as needed (gas). [provider] Taking Active Self           Med Note [place] May 01, 2021  2:29 PM)    torsemide (DEMADEX) 20 MG tablet [id number] Yes Take 2 tablets (40 mg total) by mouth 2 (two) times daily. Minus Breeding, MD Taking Active             Patient Active Problem List   Diagnosis Date Noted   Nail disorder 07/07/2021   Pre-ulcerative corn or callous 07/07/2021   Rash 07/07/2021   AKI (acute kidney injury) ([place]) 30/16/0109   Diastolic congestive heart failure ([place]) 03/17/2021   Diabetes mellitus type 2 in obese ([place]) 32/35/5732   Acute diastolic CHF (congestive heart failure) ([place]) 03/07/2021   Acute on chronic diastolic (congestive) heart failure ([place]) 03/06/2021   Vitamin D deficiency 01/01/2021   Medial epicondylitis 12/31/2020   Aortic atherosclerosis ([place]) 20/25/4270   Umbilical hernia 62/37/6283   Constipation 08/05/2020   Arthritis 06/12/2020   Hoarseness 05/01/2020   Vitamin B 12 deficiency 01/31/2020   Rosacea 01/31/2020   Neck swelling 01/31/2020   History of colonic polyps    Benign neoplasm of Old    Nausea 08/31/2019   Throat pain 07/26/2019   HLD (hyperlipidemia) 05/24/2019   Leg cramps 05/24/2019   Lump in neck 05/24/2019   Left thyroid nodule 01/25/2019   Jerking movements of extremities 07/20/2018   Balance disorder 07/20/2018   Right knee pain 03/21/2018   OSA (obstructive sleep apnea) 03/21/2018   Oxygen desaturation 03/21/2018   Urinary symptom or sign 03/21/2018   Acquired lymphedema 01/25/2018   Gait disorder 01/25/2018   Peripheral edema 01/16/2018   SOB (shortness of breath) 01/15/2018   Dysphonia  12/07/2017   Encounter for well adult exam with abnormal  findings 11/08/2017   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   Droz polyp 08/11/2016   Abdominal pain, epigastric    Dysphagia    [place] cancer screening    ACE-inhibitor cough 12/05/2015   Restless legs syndrome 10/08/2015   Venous stasis dermatitis of both lower extremities 04/26/2015   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/17/2014   AR (allergic rhinitis) 10/17/2014   Fatty liver 10/17/2014   Chronic pain syndrome 09/19/2014   Post-traumatic osteoarthritis of both knees 09/19/2014   Spinal stenosis 09/19/2014   Depression, major, single episode, complete remission ([place]) 09/19/2014   Narcotic dependence ([place]) 09/19/2014   Spondylosis of 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 03/09/2012   Lumbar disc disease 03/09/2012   Chronic pain 03/09/2012   Morbid obesity ([place]) 06/17/2011   Chronic low back pain 12/28/2008   Anxiety state 12/06/2007   Depression 12/06/2007   Benign hypertension 12/06/2007   Gastroesophageal reflux disease without esophagitis 12/06/2007   LOW BACK PAIN 12/06/2007    Immunization History  Administered Date(s) Administered   Hepatitis B, ped/adol 06/03/2016, 07/17/2016   Influenza Split 10/13/2012   Influenza, Seasonal, Injecte, Preservative Fre 09/19/2014, 10/08/2015, 09/11/2016   Influenza,inj,Quad PF,6+ Mos 08/31/2013, 11/08/2017, 12/30/2018, 12/31/2020   Moderna SARS-COV2 Booster Vaccination 10/19/2020   Moderna Sars-Covid-2 Vaccination 09/21/2020   Pneumococcal Polysaccharide-23 08/18/2013   Td 12/21/2004   Tdap 05/01/2015, 11/08/2017    Conditions to be addressed/monitored:  Hyperlipidemia, Diabetes, Heart Failure, GERD, Anxiety, Allergic Rhinitis, Chronic Pain, Constipation, Allergic Rhinitis, and Restless Leg Syndrome   Care Plan : CCM Care Plan  Updates made by Tomasa Blase, RPH since 08/01/2021 12:00 AM     Problem: HTN, HF, HLD, DM2, RLS, Chronic Pain, Allergic Rhinitis, GERD, Constipation, Anxiety, Vitamin D Deficiency    Priority: High  Onset Date: 08/01/2021     Long-Range Goal: Disease Management   Start Date: 08/01/2021  Expected End Date: 02/01/2022  This Visit's Progress: On track  Priority: High  Note:   Current Barriers:  Unable to independently monitor therapeutic efficacy  Pharmacist Clinical Goal(s):  Patient will achieve adherence to monitoring guidelines and medication adherence to achieve therapeutic efficacy Optimize safety of medications by limiting interactions of benzodiazepine, opioids, and muscle relaxer, but limiting amount of each that is taken through collaboration with PharmD and provider.   Interventions: 1:1 collaboration with Biagio Borg, MD regarding development and update of comprehensive plan of care as evidenced by provider attestation and co-signature Inter-disciplinary care team collaboration (see longitudinal plan of care) Comprehensive medication review performed; medication list updated in electronic medical record  Diabetes (A1c goal <7%) -Controlled Lab Results  Component Value Date   HGBA1C 6.1 07/07/2021  -Current medications: Metformin 554m - 1 tablet twice daily  Ozempic 168m- inject 24m82meekly - stopped due to side effects  -Medications previously tried: n/a  -Current home glucose readings fasting glucose: 96-110 -Denies hypoglycemic/hyperglycemic symptoms -Current meal patterns:  breakfast: egg whites with toast, special k cereal, bage lunch: meals on wheels provides - has potatoes and rice once weekly   dinner: spaghetti with meat sauce / lean cuisine  snacks: pretzels (low sodium) drinks: water - may have 1 bottle of peach green tea daily  -Current exercise: limited, reports that it is hard for her to be active due to ankle pain -Educated on A1c and blood sugar goals; Complications of diabetes including kidney damage, retinal damage, and cardiovascular disease; Benefits of weight loss; Prevention and  management of hypoglycemic  episodes; Benefits of routine self-monitoring of blood sugar; -Counseled to check feet daily and get yearly eye exams -Counseled on diet and exercise extensively Recommended to continue current medication  Heart Failure (Goal: manage symptoms and prevent exacerbations)/ Hypertension BP goal <140/90) -Controlled -Last ejection fraction: 60-65% (Date: 03/08/2021) -HF type: Diastolic -NYHA Class: III (marked limitation of activity) -Current treatment: Losartan 198m - 1 tablet daily - not currently taking   Metoprolol Tartrate 21m- 1 tablet twice daily  Torsemide 2061m 2 tablets twice daily  Potassium Chloride 25m81m 1 tablet 2 times daily  -Medications previously tried: furosemide, hctz, metolazone,   -Current home BP/HR readings: yesterday 122/60 HR 60's -Current dietary habits: reports to limited sodium intake  -Current exercise habits: limited - exercise is difficult due to ankle pain -Educated on Benefits of medications for managing symptoms and prolonging life Importance of weighing daily; if you gain more than 3 pounds in one day or 5 pounds in one week, call the PCP office to discuss/ proceed to ER Proper diuretic administration and potassium supplementation Importance of blood pressure control -Counseled on diet and exercise extensively Recommended to continue current medication Due to hypotension when taking losartan, recommended for patient to continue hold of losartan at this time and continue to monitor BP and HR   Hyperlipidemia: (LDL goal < 70) -Not ideally controlled Lab Results  Component Value Date   LDLCALC 76 07/07/2021  -Current treatment: N/a -Medications previously tried: rosuvastatin   -Current dietary patterns: reports diet that is limited in high cholesterol foods -Current exercise habits: limited due to ankle pain -Educated on Cholesterol goals;  Benefits of statin for ASCVD risk reduction; Importance of limiting foods high in  cholesterol; Strategies to manage statin-induced myalgias; -Recommended for patient to recheck vitamin D levels, once back in range, would recommend for patient to retrial statin medication, as patient is only slightly elevated from goal, would recommend trial of low intensity statin such as pravastatin 25mg96mly to reduce chance of adverse effects   Anxiety (Goal: Prevention / control of anxiety attacks) -Controlled -Current treatment: Clonazepam 1mg -19mtablet twice daily as needed  -Medications previously tried/failed: alprazolam, duloxetine, citalopram, sertraline,  -GAD7: unable to be completed today,  -Educated on Benefits of medication for symptom control Benefits of cognitive-behavioral therapy with or without medication -Recommended for patient to reduce clonazepam, reports that she is only taking 1 tablet daily if needed, ideally would be able to replace benzodiazepine with a medication such as buspar which would be safer for patient as she is using oxycodone and cyclobenzaprine for pain- patient agreeable to work with CCM team to reduce use of clonazepam and possibly trial buspar in the future  -Will reduce clonazepam to 1/2 tablet daily if needed, asked to be referred for another psych counselor   Chronic Pain - OA of knees, lumbar disc disease (Goal: Pain control) -Controlled -Current treatment  Oxycodone 10mg -56mablet every 4 hours if needed (no more than 5 times daily) - follows with Dr. Heag foLennox Grumblesin management  Cyclobenzaprine 10mg - 68mblet twice daily as needed  - might take twice weekly  Acetaminophen 325mg - 288mlets every 6 hours as needed  Narcan 4mg/0.1mL78mas needed (opioid overdose) -Medications previously tried: percocet, diclofenac, ibuprofen, meloxicam, naproxen, tramadol, tizanidine  -Counseled on safety concern with contaminant use of oxycodone, cyclobenzaprine, and clonazepam, patient reports that she is aware of how to use narcan if needed, advised for  patient to ensure those that are around her are also aware of how to administer in case of emergency   Constipation (Goal: promotion of regular bowel habbits) -Controlled -Current treatment  Linzess 142mg - 1 tablet daily as needed  Miralax - 17g daily as needed  Simethicone extra strength - 1 tablet daily as needed  Loperamide 260m- 1 tablet daily as needed for diarrhea  -Medications previously tried: n/a  -Recommended to continue current medication  Vitamin D Deficiency (Goal: Maintenance of proper vitamin D levels) -Not ideally controlled - vitamin D level has not been rechecked since increasing dose  Last vitamin D Lab Results  Component Value Date   VD25OH 26.86 (L) 07/07/2021  -Current treatment  Vitamin D 2000 units - 2 tablet daily  -Medications previously tried: n/a  -Recommended to continue current medication  Restless Leg Syndrom (Goal: Prevention of abnormal movement of legs) -Controlled -Current treatment  Ropinirole 39m42m 1 tablet 3 times daily  -Medications previously tried: gabapentin -Recommended to continue current medication  Allergic Rhinitis (Goal: control/ prevention of allergies) -Controlled -Current treatment  Fexofenadine 180m73m1 tablet daily if needed  Albuterol 108mc52mt HFA - 2 puffs every 6 hours as needed  - rarely uses  -Medications previously tried: mometasone nasal spray  -Recommended to continue current medication  GERD (Goal: control / prevention of acid reflux) -Controlled -Current treatment  Pantoprazole 40mg 78mtablet daily  Ondansetron 4mg- 174mblet every 4 hours as needed for nausea   -Medications previously tried: ranitidine,   -Recommended to continue current medication   Health Maintenance -Vaccine gaps: COVID booster, flu vaccine, shingles vaccines -Current therapy:  Systane eye drops - 1 drop into both eyes daily as needed  -Counseled on diet and exercise extensively  Patient Goals/Self-Care Activities Patient  will:  - take medications as prescribed check glucose daily, document, and provide at future appointments check blood pressure daily, document, and provide at future appointments weigh daily, and contact provider if weight gain of >3 lbs in a day or >5lbs in a week engage in dietary modifications by continuing with reduced sodium intake, and lowering intake of foods high in cholesterol  Follow Up Plan: Telephone follow up appointment with care management team member scheduled for: The patient has been provided with contact information for the care management team and has been advised to call with any health related questions or concerns.        Medication Assistance: None required.  Patient affirms current coverage meets needs.   Patient's preferred pharmacy is:  [id number] [id number] [place] [place] [phone]-[id number] [place] [phone] [address] [phone] [id number] pill box? Yes Pt endorses 95-100% compliance  Care Plan and Follow Up Patient Decision:  Patient agrees to Care Plan and Follow-up.  Plan: Telephone follow up appointment with care management team member scheduled for:  3 months  and The patient has been provided with contact information for the care management team and has been advised to call with any health related questions or concerns.   Rochell Mabie Tomasa BlaseD Clinical Pharmacist, [place]

## 2021-07-31 NOTE — Telephone Encounter (Signed)
[place] sorry then, as we should hold on further changes in tx  Since these medications were to assist with wt loss primarily, and her last A1c was 6.1, we can hold on other medication for DM, as they would be too much for the sugar and not help so much with wt loss

## 2021-08-01 ENCOUNTER — Ambulatory Visit (INDEPENDENT_AMBULATORY_CARE_PROVIDER_SITE_OTHER): Payer: Medicare Other

## 2021-08-01 ENCOUNTER — Other Ambulatory Visit: Payer: Self-pay

## 2021-08-01 DIAGNOSIS — G2581 Restless legs syndrome: Secondary | ICD-10-CM

## 2021-08-01 DIAGNOSIS — E559 Vitamin D deficiency, unspecified: Secondary | ICD-10-CM

## 2021-08-01 DIAGNOSIS — I5032 Chronic diastolic (congestive) heart failure: Secondary | ICD-10-CM | POA: Diagnosis not present

## 2021-08-01 DIAGNOSIS — E1169 Type 2 diabetes mellitus with other specified complication: Secondary | ICD-10-CM | POA: Diagnosis not present

## 2021-08-01 NOTE — Patient Instructions (Signed)
Visit Information   PATIENT GOALS:   Goals Addressed             This Visit's Progress    Manage Chronic Pain       Timeframe:  Long-Range Goal Priority:  High Start Date:  08/01/2021                           Expected End Date:  02/01/2022                     Follow Up Date 02/01/2022   - bring pain diary to all appointments - develop a personal pain management plan - keep track of prescription refills - plan exercise or activity when pain is best controlled - prioritize tasks for the day - track times pain is worst and when it is best - track what makes the pain worse and what makes it better - use ice or heat for pain relief - work slower and less intense when having pain    Why is this important?   Day-to-day life can be hard when you have chronic pain.  Pain medicine is just one piece of the treatment puzzle.  You can try these action steps to help you manage your pain.       Set My Target A1C-Diabetes Type 2       Timeframe:  Long-Range Goal Priority:  High Start Date:  08/01/2021                           Expected End Date:  02/01/2022                     Follow Up Date 02/01/2022   - set target A1C <7.0    Why is this important?   Your target A1C is decided together by you and your doctor.  It is based on several things like your age and other health issues.     Track and Manage Symptoms-Heart Failure       Timeframe:  Long-Range Goal Priority:  High Start Date:   08/01/2021                          Expected End Date:  02/01/2022                     Follow Up Date 02/01/2022    - eat more whole grains, fruits and vegetables, lean meats and healthy fats - know when to call the doctor - track symptoms and what helps feel better or worse    Why is this important?   You will be able to handle your symptoms better if you keep track of them.  Making some simple changes to your lifestyle will help.  Eating healthy is one thing you can do to take good care of  yourself.          Consent to CCM Services: Maria [name] was given information about Chronic Care Management services including:  CCM service includes personalized support from designated clinical staff supervised by her physician, including individualized plan of care and coordination with other care providers 24/7 contact phone numbers for assistance for urgent and routine care needs. Service will only be billed when office clinical staff spend 20 minutes or more in a month to coordinate care. Only one practitioner may furnish and  bill the service in a calendar month. The patient may stop CCM services at any time (effective at the end of the month) by phone call to the office staff. The patient will be responsible for cost sharing (co-pay) of up to 20% of the service fee (after annual deductible is met).  Patient agreed to services and verbal consent obtained.   Patient verbalizes understanding of instructions provided today and agrees to view in [place].   Telephone follow up appointment with care management team member scheduled for: 3 months The patient has been provided with contact information for the care management team and has been advised to call with any health related questions or concerns.   Tomasa Blase, PharmD Clinical Pharmacist, [place]    CLINICAL CARE PLAN: Patient Care Plan: Heart Failure (Adult)     Problem Identified: Symptom Exacerbation (Heart Failure)   Priority: High     Long-Range Goal: Symptom Exacerbation Prevented or Minimized   Start Date: 04/25/2021  Expected End Date: 10/26/2021  This Visit's Progress: On track  Recent Progress: On track  Priority: High  Note:   Current Barriers:  Knowledge deficit related to basic heart failure pathophysiology and self care management Multiple recent hospitalizations for CHF exacerbation Case Manager Clinical Goal(s):  04/25/21: Over the next 6 months, as evidenced by patient reporting during  CCM RMN CM outreach, patient will: Monitor and record daily weights at home (notifying MD of 3 lb weight gain over night or 5 lb in a week) Independently verbalize understanding of Heart Failure Action Plan and when to call doctor Take all heart failure mediations as prescribed Interventions:  Collaboration with Biagio Borg, MD regarding development and update of comprehensive plan of care as evidenced by provider attestation and co-signature Inter-disciplinary care team collaboration (see longitudinal plan of care) Chart reviewed including relevant office notes, upcoming scheduled appointments, and lab results Discussed current  clinical condition with patient and confirmed no current clinical or medication concerns; denies shortness of breath, pain, new/ recent falls; denies increased leg swelling, feeling of tightness in chest cavity Patient reports recent increased feelings of "weakness in legs" and "extreme tiredness and sleepiness;" she does not know what is causing this and states she decided to stop taking her diuretic last week for a few days: she subsequently had an increase in her weight at home from 357- range to 370 pounds today: patient states it is very frustrating and upsetting for her to have to take the fluid pills so often; states she "just stays in the bathroom all day peeing;" we discussed options around timing of her diuretic/ potassium to maximize her quality of life.  She confirms she has resumed taking diuretic after having gained so much weight and I again reiterated the importance of medication adherence- will place CCM Pharmacy team referral for general medication review to address recent period of increased fatigue/ sleepiness, timing of diuretic to promote quality of life  Re- confirmed patient remains able to verbalize recent instructions from cardiologist 05/09/21 for taking extra diuretic and potassium for weight gain- confirmed she has not taken any extra doses  recently Reiterated previously provided printed/ verbal education around CHF zones/ weight gain guidelines; encouraged patient to review printed educational material periodically to keep her on track: she will require ongoing reinforcement and support Confirmed patient continues following low-salt/ low carb/ low sugar diet; positive reinforcement provided Reviewed upcoming provider appointments with patient: new patient podiatry provider office visit 07/17/21; cardiology virtual appointment scheduled for  08/18/21  Self-Care Activities: Patient verbalizes understanding of plan to continue monitoring and recording daily weights at home Self administers medications as prescribed Attends all scheduled provider appointments Calls provider office for new concerns or questions Patient Goals: Doristine Devoid job continuing to monitor and write down you daily weigh at home: today you reported a weight of 370 pounds-- this is a significant increase from the weight ranges you have previously reported: it is very important that you continue to take your fluid pill (diuretic) as prescribed I have asked the pharmacy team to contact you by telephone to discuss the timing of when you take your fluid pills: please listen out for a call from the pharmacy team Follow the instructions provided to you by your cardiologist on 05/09/21 about when you should take an extra fluid pill along with the extra potassium-- I am glad to hear that you have not needed the extra fluid pill recently  Keep your cardiologist updated if you experience several days of weight gain after taking the extra fluid pill and potassium: if you gain more than 2 pounds in one day or 5 pounds in one week Great job staying on top of your daily weights at home and notifying your care providers when you have weight gain Great job tracking weight in diary or on a calendar: it is VERY important to write your daily weights down on paper Watch for swelling in feet, ankles  and legs every day: if you have an increased amount of swelling, make a note of it and let your cardiology provider know-- I am glad to hear that your swelling has improved Keep legs up while sitting Continue limiting the salt in your diet Stay as active as possible without over-doing: rest if you start to feel tired or "winded"/ short of breath Follow Up Plan:  Telephone follow up appointment with care management team member scheduled for: Tuesday August 26, 2021 at 1:00 pm The patient has been provided with contact information for the care management team and has been advised to call with any health related questions or concerns.      Patient Care Plan: Diabetes Type 2 (Adult)     Problem Identified: Glycemic Management (Diabetes, Type 2)   Priority: Medium     Long-Range Goal: Glycemic Management Optimized   Start Date: 04/25/2021  Expected End Date: 10/26/2021  This Visit's Progress: On track  Recent Progress: On track  Priority: Medium  Note:   Objective:  Lab Results  Component Value Date   HGBA1C 6.5 (H) 03/07/2021   Lab Results  Component Value Date   CREATININE 1.15 04/18/2021   CREATININE 1.22 (H) 04/11/2021   CREATININE 1.39 (H) 04/10/2021   No results found for: EGFR Current Barriers:  Knowledge Deficits related to basic Diabetes pathophysiology and self care/management New to blood sugar monitoring at home Case Manager Clinical Goal(s):  04/25/21: Over the next 6 months, patient will demonstrate improved adherence to prescribed treatment plan for diabetes self care/management as evidenced by:  daily monitoring/ recording of blood sugars at home adherence to ADA/ carb modified diet  adherence to prescribed medication regimen  contacting provider for new or worsened symptoms or questions Interventions:  Collaboration with Biagio Borg, MD regarding development and update of comprehensive plan of care as evidenced by provider attestation and  co-signature Inter-disciplinary care team collaboration (see longitudinal plan of care) Review of patient status, including review of consultants reports, relevant laboratory and other test results, and medications completed Reviewed recent  PCP office visit 07/07/21 with patient: positive reinforcement provided around A1-C at target: 6.1-- discussed correlation of average blood sugars at home around most recent A1-C result Confirmed patient obtained newly prescribed Ozempic: she reports the medication was delivered to her by her outpatient pharmacy, and she has questions about how to engage the needles and how to increase staggered dosing as she uses-- will place CCM Pharmacy referral for guidance/ support Confirmed with patient that she has continued monitoring/ recording blood sugars at home as previously discussed: positive reinforcement provided; patient continues to feel comfortable checking blood sugars at home and is able to verbalize previously provided education around ideal timing of when to monitor Reviewed with patient all recently recorded blood sugars at home since our last outreach: reports fasting ranges between 102-112 and post-prandial values between 123-148; reports fasting blood sugar this morning of 102 Confirmed patient continues previously discussed dietary strategies to consider in setting of DM- patient will require ongoing reinforcement of same; Reinforced previously provided education around importance/ rationale for regular foot care: regularly checks feet at home, obtained referral/ has new patient appointment with podiatry scheduled 07/17/21 Reviewed recent pain management provider office visit- confirmed she attended appointment for injection in (R) knee on 06/27/21- reports injection has "really helped" knee pain Discussed plans with patient for ongoing care management follow up and provided patient with direct contact information for care management team Self-Care  Activities Self administers oral medications as prescribed Attends all scheduled provider appointments Patient Goals: Doristine Devoid job monitoring and recording your blood sugar!  Continue to write down your blood sugars at home first thing in the morning before you eat (fasting) and 2 hours after eating a regular meal- alternating days: today, your reported blood sugar values at home today continue to look great and are right on target Re- check blood sugar if I feel it is too high or too low, or if you don't feel well in general Enter/ record blood sugar readings into daily log- write down your blood sugars on paper in a dedicated place where you can review them easily- we will review your blood sugars each time we talk Take the blood sugar log to all doctor visits- these values will help your doctor make sure you are on the right medication Please periodically read over the educational materials I have sent you periodically- this will help to keep you on target with your blood sugar levels at home; the "Living Well with Diabetes" booklet is a great resource and has a lot of great information in it Your most recent A1-C of 6.1 on 07/07/21 is right on target and represents an average blood sugar over 3 months of approximately 125 I have asked the Pharmacy team to contact you by phone to review your medications and answer your questions about newly prescribed Ozempic- please listen out for a call from the pharmacy team Follow Up Plan:  Telephone follow up appointment with care management team member scheduled for: Tuesday August 26, 2021 at 1:00 pm The patient has been provided with contact information for the care management team and has been advised to call with any health related questions or concerns.      Patient Care Plan: CCM Care Plan     Problem Identified: HTN, HF, HLD, DM2, RLS, Chronic Pain, Allergic Rhinitis, GERD, Constipation, Anxiety, Vitamin D Deficiency   Priority: High  Onset Date:  08/01/2021     Long-Range Goal: Disease Management   Start Date: 08/01/2021  Expected End Date:  02/01/2022  This Visit's Progress: On track  Priority: High  Note:   Current Barriers:  Unable to independently monitor therapeutic efficacy  Pharmacist Clinical Goal(s):  Patient will achieve adherence to monitoring guidelines and medication adherence to achieve therapeutic efficacy Optimize safety of medications by limiting interactions of benzodiazepine, opioids, and muscle relaxer, but limiting amount of each that is taken through collaboration with PharmD and provider.   Interventions: 1:1 collaboration with Biagio Borg, MD regarding development and update of comprehensive plan of care as evidenced by provider attestation and co-signature Inter-disciplinary care team collaboration (see longitudinal plan of care) Comprehensive medication review performed; medication list updated in electronic medical record  Diabetes (A1c goal <7%) -Controlled Lab Results  Component Value Date   HGBA1C 6.1 07/07/2021  -Current medications: Metformin 544m - 1 tablet twice daily  Ozempic 136m- inject 46m86meekly - stopped due to side effects  -Medications previously tried: n/a  -Current home glucose readings fasting glucose: 96-110 -Denies hypoglycemic/hyperglycemic symptoms -Current meal patterns:  breakfast: egg whites with toast, special k cereal, bage lunch: meals on wheels provides - has potatoes and rice once weekly   dinner: spaghetti with meat sauce / lean cuisine  snacks: pretzels (low sodium) drinks: water - may have 1 bottle of peach green tea daily  -Current exercise: limited, reports that it is hard for her to be active due to ankle pain -Educated on A1c and blood sugar goals; Complications of diabetes including kidney damage, retinal damage, and cardiovascular disease; Benefits of weight loss; Prevention and management of hypoglycemic episodes; Benefits of routine self-monitoring  of blood sugar; -Counseled to check feet daily and get yearly eye exams -Counseled on diet and exercise extensively Recommended to continue current medication  Heart Failure (Goal: manage symptoms and prevent exacerbations)/ Hypertension BP goal <140/90) -Controlled -Last ejection fraction: 60-65% (Date: 03/08/2021) -HF type: Diastolic -NYHA Class: III (marked limitation of activity) -Current treatment: Losartan 100m29m1 tablet daily - not currently taking   Metoprolol Tartrate 25mg39m tablet twice daily  Torsemide 20mg 6mtablets twice daily  Potassium Chloride 20mEq 29mtablet 2 times daily  -Medications previously tried: furosemide, hctz, metolazone,   -Current home BP/HR readings: yesterday 122/60 HR 60's -Current dietary habits: reports to limited sodium intake  -Current exercise habits: limited - exercise is difficult due to ankle pain -Educated on Benefits of medications for managing symptoms and prolonging life Importance of weighing daily; if you gain more than 3 pounds in one day or 5 pounds in one week, call the PCP office to discuss/ proceed to ER Proper diuretic administration and potassium supplementation Importance of blood pressure control -Counseled on diet and exercise extensively Recommended to continue current medication Due to hypotension when taking losartan, recommended for patient to continue hold of losartan at this time and continue to monitor BP and HR   Hyperlipidemia: (LDL goal < 70) -Not ideally controlled Lab Results  Component Value Date   LDLCALC 76 07/07/2021  -Current treatment: N/a -Medications previously tried: rosuvastatin   -Current dietary patterns: reports diet that is limited in high cholesterol foods -Current exercise habits: limited due to ankle pain -Educated on Cholesterol goals;  Benefits of statin for ASCVD risk reduction; Importance of limiting foods high in cholesterol; Strategies to manage statin-induced  myalgias; -Recommended for patient to recheck vitamin D levels, once back in range, would recommend for patient to retrial statin medication, as patient is only slightly elevated from goal, would recommend trial of  low intensity statin such as pravastatin 24m daily to reduce chance of adverse effects   Anxiety (Goal: Prevention / control of anxiety attacks) -Controlled -Current treatment: Clonazepam 157m- 1 tablet twice daily as needed  -Medications previously tried/failed: alprazolam, duloxetine, citalopram, sertraline,  -GAD7: unable to be completed today,  -Educated on Benefits of medication for symptom control Benefits of cognitive-behavioral therapy with or without medication -Recommended for patient to reduce clonazepam, reports that she is only taking 1 tablet daily if needed, ideally would be able to replace benzodiazepine with a medication such as buspar which would be safer for patient as she is using oxycodone and cyclobenzaprine for pain- patient agreeable to work with CCM team to reduce use of clonazepam and possibly trial buspar in the future  -Will reduce clonazepam to 1/2 tablet daily if needed, asked to be referred for another psych counselor   Chronic Pain - OA of knees, lumbar disc disease (Goal: Pain control) -Controlled -Current treatment  Oxycodone 1036m 1 tablet every 4 hours if needed (no more than 5 times daily) - follows with Dr. HeaLennox Grumblesr pain management  Cyclobenzaprine 87m43m1 tablet twice daily as needed  - might take twice weekly  Acetaminophen 325mg66m tablets every 6 hours as needed  Narcan 4mg/037mL - as needed (opioid overdose) -Medications previously tried: percocet, diclofenac, ibuprofen, meloxicam, naproxen, tramadol, tizanidine  -Counseled on safety concern with contaminant use of oxycodone, cyclobenzaprine, and clonazepam, patient reports that she is aware of how to use narcan if needed, advised for patient to ensure those that are around her are also  aware of how to administer in case of emergency   Constipation (Goal: promotion of regular bowel habbits) -Controlled -Current treatment  Linzess 145mcg 22mtablet daily as needed  Miralax - 17g daily as needed  Simethicone extra strength - 1 tablet daily as needed  Loperamide 2mg - 117mblet daily as needed for diarrhea  -Medications previously tried: n/a  -Recommended to continue current medication  Vitamin D Deficiency (Goal: Maintenance of proper vitamin D levels) -Not ideally controlled - vitamin D level has not been rechecked since increasing dose  Last vitamin D Lab Results  Component Value Date   VD25OH 26.86 (L) 07/07/2021  -Current treatment  Vitamin D 2000 units - 2 tablet daily  -Medications previously tried: n/a  -Recommended to continue current medication  Restless Leg Syndrom (Goal: Prevention of abnormal movement of legs) -Controlled -Current treatment  Ropinirole 1mg - 1 101mlet 3 times daily  -Medications previously tried: gabapentin -Recommended to continue current medication  Allergic Rhinitis (Goal: control/ prevention of allergies) -Controlled -Current treatment  Fexofenadine 180mg - 1 16met daily if needed  Albuterol 108mcg/act 76m- 2 puffs every 6 hours as needed  - rarely uses  -Medications previously tried: mometasone nasal spray  -Recommended to continue current medication  GERD (Goal: control / prevention of acid reflux) -Controlled -Current treatment  Pantoprazole 40mg - 1 ta88m daily  Ondansetron 4mg- 1 table67mvery 4 hours as needed for nausea   -Medications previously tried: ranitidine,   -Recommended to continue current medication   Health Maintenance -Vaccine gaps: COVID booster, flu vaccine, shingles vaccines -Current therapy:  Systane eye drops - 1 drop into both eyes daily as needed  -Counseled on diet and exercise extensively  Patient Goals/Self-Care Activities Patient will:  - take medications as prescribed check glucose  daily, document, and provide at future appointments check blood pressure daily, document, and provide at future  appointments weigh daily, and contact provider if weight gain of >3 lbs in a day or >5lbs in a week engage in dietary modifications by continuing with reduced sodium intake, and lowering intake of foods high in cholesterol  Follow Up Plan: Telephone follow up appointment with care management team member scheduled for: The patient has been provided with contact information for the care management team and has been advised to call with any health related questions or concerns.

## 2021-08-01 NOTE — Telephone Encounter (Signed)
pt has called stating she received your VM and would like a call back as she really would like to discuss her medications.  *Sent Linna Hoff

## 2021-08-01 NOTE — Telephone Encounter (Signed)
Spoke with pt and was able to inform her of Dr. Gwynn Burly advice. Pt understood and says she agrees. No further questions or concerns at this time.

## 2021-08-10 ENCOUNTER — Emergency Department (HOSPITAL_COMMUNITY): Payer: Medicare Other

## 2021-08-10 ENCOUNTER — Encounter (HOSPITAL_COMMUNITY): Payer: Self-pay | Admitting: Emergency Medicine

## 2021-08-10 ENCOUNTER — Other Ambulatory Visit: Payer: Self-pay

## 2021-08-10 ENCOUNTER — Emergency Department (HOSPITAL_COMMUNITY)
Admission: EM | Admit: 2021-08-10 | Discharge: 2021-08-10 | Disposition: A | Discharge status: Home / Self Care | Payer: Medicare Other | Attending: Emergency Medicine | Admitting: Emergency Medicine

## 2021-08-10 DIAGNOSIS — Z7984 Long term (current) use of oral hypoglycemic drugs: Secondary | ICD-10-CM | POA: Diagnosis not present

## 2021-08-10 DIAGNOSIS — R531 Weakness: Secondary | ICD-10-CM | POA: Diagnosis not present

## 2021-08-10 DIAGNOSIS — Z20822 Contact with and (suspected) exposure to covid-19: Secondary | ICD-10-CM | POA: Diagnosis not present

## 2021-08-10 DIAGNOSIS — Z91040 Latex allergy status: Secondary | ICD-10-CM | POA: Diagnosis not present

## 2021-08-10 DIAGNOSIS — R269 Unspecified abnormalities of gait and mobility: Secondary | ICD-10-CM | POA: Diagnosis not present

## 2021-08-10 DIAGNOSIS — R059 Cough, unspecified: Secondary | ICD-10-CM | POA: Diagnosis not present

## 2021-08-10 DIAGNOSIS — R609 Edema, unspecified: Secondary | ICD-10-CM

## 2021-08-10 DIAGNOSIS — Z743 Need for continuous supervision: Secondary | ICD-10-CM | POA: Diagnosis not present

## 2021-08-10 DIAGNOSIS — I110 Hypertensive heart disease with heart failure: Secondary | ICD-10-CM | POA: Insufficient documentation

## 2021-08-10 DIAGNOSIS — Z79899 Other long term (current) drug therapy: Secondary | ICD-10-CM | POA: Diagnosis not present

## 2021-08-10 DIAGNOSIS — Z87891 Personal history of nicotine dependence: Secondary | ICD-10-CM | POA: Diagnosis not present

## 2021-08-10 DIAGNOSIS — B9689 Other specified bacterial agents as the cause of diseases classified elsewhere: Secondary | ICD-10-CM | POA: Insufficient documentation

## 2021-08-10 DIAGNOSIS — R600 Localized edema: Secondary | ICD-10-CM | POA: Insufficient documentation

## 2021-08-10 DIAGNOSIS — E119 Type 2 diabetes mellitus without complications: Secondary | ICD-10-CM | POA: Insufficient documentation

## 2021-08-10 DIAGNOSIS — N3000 Acute cystitis without hematuria: Secondary | ICD-10-CM | POA: Diagnosis not present

## 2021-08-10 DIAGNOSIS — M539 Dorsopathy, unspecified: Secondary | ICD-10-CM | POA: Diagnosis not present

## 2021-08-10 DIAGNOSIS — R0602 Shortness of breath: Secondary | ICD-10-CM | POA: Diagnosis not present

## 2021-08-10 DIAGNOSIS — I5033 Acute on chronic diastolic (congestive) heart failure: Secondary | ICD-10-CM | POA: Insufficient documentation

## 2021-08-10 DIAGNOSIS — I517 Cardiomegaly: Secondary | ICD-10-CM | POA: Diagnosis not present

## 2021-08-10 DIAGNOSIS — I2699 Other pulmonary embolism without acute cor pulmonale: Secondary | ICD-10-CM | POA: Diagnosis not present

## 2021-08-10 DIAGNOSIS — M172 Bilateral post-traumatic osteoarthritis of knee: Secondary | ICD-10-CM | POA: Diagnosis not present

## 2021-08-10 DIAGNOSIS — J9690 Respiratory failure, unspecified, unspecified whether with hypoxia or hypercapnia: Secondary | ICD-10-CM | POA: Diagnosis not present

## 2021-08-10 LAB — RESP PANEL BY RT-PCR (FLU A&B, COVID) ARPGX2
Influenza A by PCR: NEGATIVE
Influenza B by PCR: NEGATIVE
SARS Coronavirus 2 by RT PCR: NEGATIVE

## 2021-08-10 LAB — CBC WITH DIFFERENTIAL/PLATELET
Abs Immature Granulocytes: 0.04 10*3/uL (ref 0.00–0.07)
Basophils Absolute: 0 10*3/uL (ref 0.0–0.1)
Basophils Relative: 0 %
Eosinophils Absolute: 0.2 10*3/uL (ref 0.0–0.5)
Eosinophils Relative: 2 %
HCT: 38 % (ref 36.0–46.0)
Hemoglobin: 12.5 g/dL (ref 12.0–15.0)
Immature Granulocytes: 0 %
Lymphocytes Relative: 25 %
Lymphs Abs: 2.5 10*3/uL (ref 0.7–4.0)
MCH: 30.3 pg (ref 26.0–34.0)
MCHC: 32.9 g/dL (ref 30.0–36.0)
MCV: 92.2 fL (ref 80.0–100.0)
Monocytes Absolute: 0.7 10*3/uL (ref 0.1–1.0)
Monocytes Relative: 7 %
Neutro Abs: 6.5 10*3/uL (ref 1.7–7.7)
Neutrophils Relative %: 66 %
Platelets: 257 10*3/uL (ref 150–400)
RBC: 4.12 MIL/uL (ref 3.87–5.11)
RDW: 12.7 % (ref 11.5–15.5)
WBC: 9.9 10*3/uL (ref 4.0–10.5)
nRBC: 0 % (ref 0.0–0.2)

## 2021-08-10 LAB — URINALYSIS, ROUTINE W REFLEX MICROSCOPIC
Bilirubin Urine: NEGATIVE
Glucose, UA: NEGATIVE mg/dL
Hgb urine dipstick: NEGATIVE
Ketones, ur: NEGATIVE mg/dL
Nitrite: NEGATIVE
Protein, ur: NEGATIVE mg/dL
Specific Gravity, Urine: 1.01 (ref 1.005–1.030)
pH: 6 (ref 5.0–8.0)

## 2021-08-10 LAB — BASIC METABOLIC PANEL
Anion gap: 8 (ref 5–15)
BUN: 19 mg/dL (ref 8–23)
CO2: 32 mmol/L (ref 22–32)
Calcium: 8.7 mg/dL — ABNORMAL LOW (ref 8.9–10.3)
Chloride: 102 mmol/L (ref 98–111)
Creatinine, Ser: 1.05 mg/dL — ABNORMAL HIGH (ref 0.44–1.00)
GFR, Estimated: 59 mL/min — ABNORMAL LOW (ref >60)
Glucose, Bld: 156 mg/dL — ABNORMAL HIGH (ref 70–99)
Potassium: 3.8 mmol/L (ref 3.5–5.1)
Sodium: 142 mmol/L (ref 135–145)

## 2021-08-10 LAB — TROPONIN I (HIGH SENSITIVITY)
Troponin I (High Sensitivity): 3 ng/L (ref <18)
Troponin I (High Sensitivity): 2 ng/L (ref <18)

## 2021-08-10 LAB — D-DIMER, QUANTITATIVE: D-Dimer, Quant: 1.3 ug/mL-FEU — ABNORMAL HIGH (ref 0.00–0.50)

## 2021-08-10 LAB — BRAIN NATRIURETIC PEPTIDE: B Natriuretic Peptide: 22.3 pg/mL (ref 0.0–100.0)

## 2021-08-10 MED ORDER — FUROSEMIDE 10 MG/ML IJ SOLN
40.0000 mg | Freq: Once | INTRAMUSCULAR | Status: AC
  Administered 2021-08-10: 40 mg via INTRAVENOUS
  Filled 2021-08-10: qty 4

## 2021-08-10 MED ORDER — HYDROMORPHONE HCL 1 MG/ML IJ SOLN
1.0000 mg | Freq: Once | INTRAMUSCULAR | Status: AC
Start: 2021-08-10 — End: 2021-08-10
  Administered 2021-08-10: 1 mg via INTRAVENOUS
  Filled 2021-08-10: qty 1

## 2021-08-10 MED ORDER — CEPHALEXIN 500 MG PO CAPS: 500.0000 mg | ORAL_CAPSULE | Freq: Four times a day (QID) | ORAL | 0 refills | Status: AC

## 2021-08-10 MED ORDER — IOHEXOL 350 MG/ML SOLN
80.0000 mL | Freq: Once | INTRAVENOUS | Status: AC | PRN
  Administered 2021-08-10: 80 mL via INTRAVENOUS

## 2021-08-10 NOTE — TOC Initial Note (Addendum)
Transition of Care [facility]) - Initial/Assessment Note    Patient Details  Name: Maria [name] MRN: [id number] Date of Birth: 02-17-1956  Transition of Care [facility]) CM/SW Contact:    Verdell Carmine, RN Phone Number: 08/10/2021, 2:25 PM  Clinical Narrative:                  Patient presents with swelling of legs, chest xray shows no pulm edema, patient will be discharged with walker, 3:1 and home health PT to sassist in strengthening . Calls sent to [facility] and [facility]. For [facility]. [employer] called to deliver DME to [facility] ED room prior to [facility] accepted the patient for Home Health PT.They will call and set up services   Expected Discharge Plan: [place] Barriers to Discharge: ED No Barriers   Patient Goals and CMS Choice        Expected Discharge Plan and Services Expected Discharge Plan: [place]                         DME Arranged: 3-N-1, Walker rolling   Date DME Agency Contacted: 08/10/21 Time DME Agency Contacted: [place]: [place] Date [place]: 08/10/21 Time [place]: [place] Representative spoke with at [place]: [place] Arrangements/Services                       Activities of Daily Living      Permission Sought/Granted                  Emotional Assessment              Admission diagnosis:  swelling in legs Patient Active Problem List   Diagnosis Date Noted   Nail disorder 07/07/2021   Pre-ulcerative corn or callous 07/07/2021   Rash 07/07/2021   AKI (acute kidney injury) ([place]) [id number]   Diastolic congestive heart failure ([place]) 03/17/2021   Diabetes mellitus type 2 in obese ([place]) 123456   Acute diastolic CHF (congestive heart failure) ([place]) 03/07/2021   Acute on chronic diastolic (congestive) heart failure ([place]) 03/06/2021   Vitamin D deficiency 01/01/2021   Medial epicondylitis 12/31/2020   Aortic  atherosclerosis ([place]) 123456   Umbilical hernia 123456   Constipation 08/05/2020   Arthritis 06/12/2020   Hoarseness 05/01/2020   Vitamin B 12 deficiency 01/31/2020   Rosacea 01/31/2020   Neck swelling 01/31/2020   History of colonic polyps    Benign neoplasm of [place]    Nausea 08/31/2019   Throat pain 07/26/2019   HLD (hyperlipidemia) 05/24/2019   Leg cramps 05/24/2019   Lump in neck 05/24/2019   Left thyroid nodule 01/25/2019   Jerking movements of extremities 07/20/2018   Balance disorder 07/20/2018   Right knee pain 03/21/2018   OSA (obstructive sleep apnea) 03/21/2018   Oxygen desaturation 03/21/2018   Urinary symptom or sign 03/21/2018   Acquired lymphedema 01/25/2018   Gait disorder 01/25/2018   Peripheral edema 01/16/2018   SOB (shortness of breath) 01/15/2018   Dysphonia 12/07/2017   Encounter for well adult exam with abnormal findings 11/08/2017   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   Smola polyp 08/11/2016   Abdominal pain, epigastric    Dysphagia    [place] cancer screening    ACE-inhibitor cough 12/05/2015   Restless legs syndrome 10/08/2015   Venous stasis dermatitis of both lower  extremities 04/26/2015   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/17/2014   AR (allergic rhinitis) 10/17/2014   Fatty liver 10/17/2014   Chronic pain syndrome 09/19/2014   Post-traumatic osteoarthritis of both knees 09/19/2014   Spinal stenosis 09/19/2014   Depression, major, single episode, complete remission ([place]) 09/19/2014   Narcotic dependence ([place]) 09/19/2014   Spondylosis of 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 03/09/2012   Lumbar disc disease 03/09/2012   Chronic pain 03/09/2012   Morbid obesity ([place]) 06/17/2011   Chronic low back pain 12/28/2008   Anxiety state 12/06/2007   Depression 12/06/2007   Benign hypertension 12/06/2007   Gastroesophageal reflux disease without esophagitis 12/06/2007   LOW BACK PAIN 12/06/2007   PCP:  Biagio Borg,  MD Pharmacy:   [id number] [place] [address] [place] [address] Phone: [phone] Fax: [phone]  [facility] [phone] [place] [place] [address] Phone: [phone] Fax: [place], [place] [address] [place] [address] Phone: [phone] Fax: [phone]     Social Determinants of Health (SDOH) Interventions    Readmission Risk Interventions Readmission Risk Prevention Plan 04/11/2021  Transportation Screening Complete  PCP or Specialist Appt within 3-5 Days Complete  HRI or Home Care Consult Complete  Social Work Consult for [place] Planning/Counseling Complete  Palliative Care Screening Not Applicable  Medication Review Press photographer) Complete  Some recent data might be hidden

## 2021-08-10 NOTE — Discharge Instructions (Addendum)
Please schedule follow-up with your cardiologist.  Your labs and imaging work-up today were reassuring.  You were evaluated in the Emergency Department and after careful evaluation, we did not find any emergent condition requiring admission or further testing in the hospital.  Your exam/testing today was overall reassuring.  Please return to the Emergency Department if you experience any worsening of your condition.  Thank you for allowing [place] to be a part of your care.

## 2021-08-10 NOTE — ED Provider Notes (Signed)
[name] DEPT Provider Note   CSN: [id number] Arrival date & time: 08/10/21  9381     History Chief Complaint  Patient presents with   Leg Swelling   Abdominal Pain   Shortness of Breath    Maria [name] is a 65 y.o. female.   Shortness of Breath Severity:  Severe Onset quality:  Gradual Duration:  7 days Timing:  Constant Progression:  Worsening Chronicity:  Recurrent Relieved by:  Diuretics Worsened by:  Exertion Ineffective treatments:  Diuretics Associated symptoms: abdominal pain and PND   Associated symptoms: no chest pain, no cough, no diaphoresis, no ear pain, no fever, no headaches, no rash, no sore throat, no sputum production, no vomiting and no wheezing    65 year old female with a history of HLD, HTN, GERD, anxiety, depression, DM 2, CHF (last EF 60 to 65% on 0/17/5102 with diastolic dysfunction) who presents to the emergency department with worsening shortness of breath.   She endorses an 11 pound weight gain from her dry weight over the last 7 days with worsening lower extremity edema.  She endorses paroxysmal nocturnal dyspnea and orthopnea.  She is unable to lie flat and sleeps in a recliner.  She she states that she has been taking her home torsemide as prescribed.  Despite her home p.o. diuretic, she continues to endorse worsening bilateral lower extremity swelling and worsening abdominal wall swelling.  She currently denies any chest pain.  She denies any infectious symptoms beyond shortness of breath and increased urinary frequency from her diuretic use.  She does endorse mild dysuria.  No fever or chills.  Past Medical History:  Diagnosis Date   Acute lymphadenitis 2011   ALLERGIC RHINITIS 08/10/2007   ANXIETY 12/06/2007   Atherosclerotic peripheral vascular disease ([place]) 06/13/2013   Aorta on CT June 2014   Cellulitis 2011   Cervical disc disease 03/09/2012   CHF (congestive heart failure) ([place])    Chronic pain  03/09/2012   DEPRESSION 12/06/2007   Diabetes ([place])    DIVERTICULOSIS, [place] 12/06/2007   GERD 12/06/2007   Hepatitis    age 70 hepatitis A   HLD (hyperlipidemia) 05/24/2019   HYPERTENSION 12/06/2007   Lumbar disc disease 03/09/2012   Mesenteric adenitis    MRSA 2006   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) 03/17/2021   Diabetes mellitus type 2 in obese ([place]) 42/35/3614   Acute diastolic CHF (congestive heart failure) ([place]) 03/07/2021   Acute on chronic diastolic (congestive) heart failure ([place]) 03/06/2021   Vitamin D deficiency 01/01/2021   Medial epicondylitis 12/31/2020   Aortic atherosclerosis ([place]) 43/15/4008   Umbilical hernia 67/61/9509   Constipation 08/05/2020   Arthritis 06/12/2020   Hoarseness 05/01/2020   Vitamin B 12 deficiency 01/31/2020   Rosacea 01/31/2020   Neck swelling 01/31/2020   History of colonic polyps    Benign neoplasm of [place]    Nausea 08/31/2019   Throat pain 07/26/2019   HLD (hyperlipidemia) 05/24/2019   Leg cramps 05/24/2019   Lump in neck 05/24/2019   Left thyroid nodule 01/25/2019   Jerking movements of extremities 07/20/2018   Balance disorder 07/20/2018   Right knee pain 03/21/2018   OSA (obstructive sleep apnea) 03/21/2018   Oxygen  desaturation 03/21/2018   Urinary symptom or sign 03/21/2018   Acquired lymphedema 01/25/2018   Gait disorder 01/25/2018   Peripheral edema 01/16/2018   SOB (shortness of breath) 01/15/2018   Dysphonia 12/07/2017   Encounter for well adult exam with abnormal findings 11/08/2017   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[place]) 05/31/2017   Wilson polyp 08/11/2016   Abdominal pain, epigastric    Dysphagia    [place] cancer screening     ACE-inhibitor cough 12/05/2015   Restless legs syndrome 10/08/2015   Venous stasis dermatitis of both lower extremities 04/26/2015   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/17/2014   AR (allergic rhinitis) 10/17/2014   Fatty liver 10/17/2014   Chronic pain syndrome 09/19/2014   Post-traumatic osteoarthritis of both knees 09/19/2014   Spinal stenosis 09/19/2014   Depression, major, single episode, complete remission ([place]) 09/19/2014   Narcotic dependence ([place]) 09/19/2014   Spondylosis of 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 03/09/2012   Lumbar disc disease 03/09/2012   Chronic pain 03/09/2012   Morbid obesity ([place]) 06/17/2011   Chronic low back pain 12/28/2008   Anxiety state 12/06/2007   Depression 12/06/2007   Benign hypertension 12/06/2007   Gastroesophageal reflux disease without esophagitis 12/06/2007   LOW BACK PAIN 12/06/2007    Past Surgical History:  Procedure Laterality Date   ABDOMINAL HYSTERECTOMY  1999   1 ovary left   bloo clot removed from neck   may 25th , june 2. 2010   [place]  may 24th 2010   COLONOSCOPY WITH PROPOFOL N/A 07/10/2016   Procedure: COLONOSCOPY WITH PROPOFOL;  Surgeon: Manus Gunning, MD;  Location: [facility];  Service: Gastroenterology;  Laterality: N/A;   COLONOSCOPY WITH PROPOFOL N/A 09/26/2019   Procedure: COLONOSCOPY WITH PROPOFOL;  Surgeon: Yetta Flock, MD;  Location: [facility];  Service: Gastroenterology;  Laterality: N/A;   ESOPHAGOGASTRODUODENOSCOPY (EGD) WITH PROPOFOL N/A 07/10/2016   Procedure: ESOPHAGOGASTRODUODENOSCOPY (EGD) WITH PROPOFOL;  Surgeon: Manus Gunning, MD;  Location: [facility];  Service: Gastroenterology;  Laterality: N/A;   POLYPECTOMY  09/26/2019   Procedure: POLYPECTOMY;  Surgeon: Yetta Flock, MD;  Location: [facility];  Service: Gastroenterology;;   s/p ovary cyst     s/p right knee arthroscopy     Dr. Mardelle Matte ortho     OB History   No obstetric  history on file.     Family History  Problem Relation Age of Onset   Heart disease Mother    Hypertension Mother    Diabetes Mother    Heart failure Mother    Asthma Sister    Anxiety disorder Sister    Depression Sister    Hypertension Father    Asthma Daughter    Bipolar disorder Daughter    Cancer Maternal Uncle        [place]   Cancer Other        ovarian   Liver cancer Paternal Grandmother        ????    Social History   Tobacco Use   Smoking status: Former    Years: 30.00    Types: Cigarettes    Quit date: 11/08/2008    Years since quitting: 12.7   Smokeless tobacco: Never   Tobacco comments:    quit 10/09  Vaping Use   Vaping Use: Never used  Substance Use Topics   Alcohol use: No    Comment: quit drinking 10/07/1997   Drug use: No    Home Medications Prior to Admission medications   Medication Sig  Start Date End Date Taking? Authorizing Provider  cephALEXin (KEFLEX) 500 MG capsule Take 1 capsule (500 mg total) by mouth 4 (four) times daily for 5 days. 08/10/21 08/15/21 Yes Regan Lemming, MD  Accu-Chek Softclix Lancets lancets Use as directed up to 4 times daily Patient taking differently: 1 each by Other route in the morning, at noon, in the evening, and at bedtime. 04/11/21   Elodia Florence., MD  acetaminophen (TYLENOL) 325 MG tablet Take 650 mg by mouth every 6 (six) hours as needed for headache (pain).    [provider]  albuterol (PROAIR HFA) 108 (90 Base) MCG/ACT inhaler Inhale 2 puffs into the lungs every 6 (six) hours as needed for wheezing or shortness of breath. 11/05/20   Lucretia Kern, DO  Blood Glucose Monitoring Suppl (ACCU-CHEK GUIDE) w/Device KIT Use as directed Patient taking differently: 1 each by Other route as directed. 04/11/21   Elodia Florence., MD  Cholecalciferol (HM VITAMIN D3) 100 MCG (4000 UT) CAPS Take 4,000 Units by mouth daily.    [provider]  clonazePAM (KLONOPIN) 1 MG tablet TAKE 1 TABLET BY  MOUTH 2 TIMES A DAY AS NEEDED FOR ANXIETY Patient taking differently: Take 1 mg by mouth 2 (two) times daily as needed for anxiety. 12/10/20   Biagio Borg, MD  cyclobenzaprine (FLEXERIL) 10 MG tablet TAKE 1 TABLET BY MOUTH 2 TIMES A DAY AS NEEDED FOR MUSCLE SPASMS Patient taking differently: Take 10 mg by mouth 2 (two) times daily as needed for muscle spasms. 02/27/21   Biagio Borg, MD  Fexofenadine HCl (ALLEGRA PO) Take 180 mg by mouth daily.    [provider]  glucose blood (ACCU-CHEK GUIDE) test strip Use as instructed up to 4 times daily Patient taking differently: Use as instructed up to 4 times daily 04/11/21   Elodia Florence., MD  LINZESS 145 MCG CAPS capsule Take 145 mcg by mouth daily as needed (constipation). 08/15/20   [provider]  loperamide (IMODIUM) 2 MG capsule Take 2 mg by mouth as needed for diarrhea or loose stools.    [provider]  losartan (COZAAR) 100 MG tablet TAKE ONE TABLET BY MOUTH DAILY Patient not taking: Reported on 08/01/2021 05/12/21   Biagio Borg, MD  metFORMIN (GLUCOPHAGE) 500 MG tablet Take 1 tablet (500 mg total) by mouth 2 (two) times daily with a meal. 05/13/21 08/01/21  Biagio Borg, MD  metoprolol tartrate (LOPRESSOR) 25 MG tablet Take 1 tablet (25 mg total) by mouth 2 (two) times daily. 05/09/21 08/01/21  Minus Breeding, MD  NARCAN 4 MG/0.1ML LIQD nasal spray kit Place 0.4 mg into the nose daily as needed (opioid overdose). Patient not taking: Reported on 08/01/2021 04/25/19   [provider]  ondansetron (ZOFRAN ODT) 4 MG disintegrating tablet Take 1 tablet (4 mg total) by mouth every 4 (four) hours as needed for nausea or vomiting. 08/23/19   Charlesetta Shanks, MD  Oxycodone HCl 10 MG TABS Take 1 tablet (10 mg total) 4 (four) times daily by mouth. Per [facility] Patient taking differently: Take 10 mg by mouth every 4 (four) hours as needed (pain). Per [facility] 11/08/17   Biagio Borg, MD   pantoprazole (PROTONIX) 40 MG tablet TAKE ONE TABLET BY MOUTH DAILY Patient taking differently: Take 40 mg by mouth daily. 03/03/21   Biagio Borg, MD  Polyethyl Glycol-Propyl Glycol 0.4-0.3 % SOLN Apply 1 drop to eye daily  as needed.    [provider]  polyethylene glycol powder (GLYCOLAX/MIRALAX) 17 GM/SCOOP powder Take 17 g by mouth daily as needed for mild constipation. Patient not taking: Reported on 08/01/2021 04/11/21   Elodia Florence., MD  potassium chloride SA (KLOR-CON) 20 MEQ tablet Take 1 tablet (20 mEq total) by mouth 2 (two) times daily. 06/05/21 08/01/21  Minus Breeding, MD  rOPINIRole (REQUIP) 1 MG tablet Take 1 tablet (1 mg total) by mouth 3 (three) times daily. 07/07/21   Biagio Borg, MD  Simethicone (GAS-X EXTRA STRENGTH PO) Take 1 tablet by mouth daily as needed (gas).    [provider]  torsemide (DEMADEX) 20 MG tablet Take 2 tablets (40 mg total) by mouth 2 (two) times daily. 05/09/21 08/01/21  Minus Breeding, MD  DULoxetine (CYMBALTA) 60 MG capsule Take 60 mg by mouth daily.    03/09/12  [provider]    Allergies    Amoxicillin-pot clavulanate, Adhesive [tape], Codeine, Crestor [rosuvastatin calcium], Lactose intolerance (gi), Morphine and related, Vicodin [hydrocodone-acetaminophen], and Latex  Review of Systems   Review of Systems  Constitutional:  Negative for chills, diaphoresis and fever.  HENT:  Negative for ear pain and sore throat.   Eyes:  Negative for pain and visual disturbance.  Respiratory:  Positive for shortness of breath. Negative for cough, sputum production and wheezing.   Cardiovascular:  Positive for leg swelling and PND. Negative for chest pain and palpitations.  Gastrointestinal:  Positive for abdominal pain. Negative for vomiting.  Genitourinary:  Negative for dysuria and hematuria.  Musculoskeletal:  Negative for arthralgias and back pain.  Skin:  Negative for color change and rash.  Neurological:  Negative  for seizures, syncope and headaches.  All other systems reviewed and are negative.  Physical Exam Updated Vital Signs BP 123/76   Pulse 66   Temp 98.9 F (37.2 C) (Oral)   Resp 15   SpO2 92%   Physical Exam  ED Results / Procedures / Treatments   Labs (all labs ordered are listed, but only abnormal results are displayed) Labs Reviewed  BASIC METABOLIC PANEL - Abnormal; Notable for the following components:      Result Value   Glucose, Bld 156 (*)    Creatinine, Ser 1.05 (*)    Calcium 8.7 (*)    GFR, Estimated 59 (*)    All other components within normal limits  URINALYSIS, ROUTINE W REFLEX MICROSCOPIC - Abnormal; Notable for the following components:   Leukocytes,Ua MODERATE (*)    Bacteria, UA RARE (*)    All other components within normal limits  D-DIMER, QUANTITATIVE - Abnormal; Notable for the following components:   D-Dimer, Quant 1.30 (*)    All other components within normal limits  RESP PANEL BY RT-PCR (FLU A&B, COVID) ARPGX2  BRAIN NATRIURETIC PEPTIDE  CBC WITH DIFFERENTIAL/PLATELET  TROPONIN I (HIGH SENSITIVITY)  TROPONIN I (HIGH SENSITIVITY)    EKG EKG Interpretation  Date/Time:  Sunday August 10 2021 07:23:21 EDT Ventricular Rate:  66 PR Interval:  164 QRS Duration: 111 QT Interval:  422 QTC Calculation: 443 R Axis:   75 Text Interpretation: Sinus rhythm Low voltage, precordial leads Confirmed by Regan Lemming ([id number]) on 08/10/2021 9:56:47 AM  Radiology DG Chest 2 View  Result Date: 08/10/2021 CLINICAL DATA:  Shortness of breath. Increased swelling and pain in lower legs in a 65 year old female. EXAM: CHEST - 2 VIEW COMPARISON:  May 01, 2021. FINDINGS: EKG leads project over the chest. Trachea is  midline. Cardiomediastinal contours with persistent cardiac enlargement and central pulmonary vascular engorgement. No lobar consolidation. No sign of pleural effusion. No frank edema. On limited assessment no acute skeletal process. IMPRESSION: Persistent  cardiac enlargement and central pulmonary vascular engorgement. No change from the prior radiograph. Electronically Signed   By: Zetta Bills M.D.   On: 08/10/2021 08:14   CT Angio Chest PE W and/or Wo Contrast  Result Date: 08/10/2021 CLINICAL DATA:  Lower extremity swelling. Elevated D-dimer. Clinical suspicion for pulmonary embolism. EXAM: CT ANGIOGRAPHY CHEST WITH CONTRAST TECHNIQUE: Multidetector CT imaging of the chest was performed using the standard protocol during bolus administration of intravenous contrast. Multiplanar CT image reconstructions and MIPs were obtained to evaluate the vascular anatomy. CONTRAST:  40m OMNIPAQUE IOHEXOL 350 MG/ML SOLN COMPARISON:  12/31/2018 FINDINGS: Cardiovascular: Satisfactory opacification of pulmonary arteries noted, however image quality is technically suboptimal due to large habitus. No pulmonary emboli identified. No evidence of thoracic aortic dissection or aneurysm. Mild cardiomegaly noted. 3 Mediastinum/Nodes: No masses or pathologically enlarged lymph nodes identified. Lungs/Pleura: No pulmonary mass, infiltrate, or effusion. Upper abdomen: No acute findings. Musculoskeletal: No suspicious bone lesions identified. Review of the MIP images confirms the above findings. IMPRESSION: No evidence of pulmonary embolism or other acute findings. Mild cardiomegaly. Electronically Signed   By: JMarlaine HindM.D.   On: 08/10/2021 11:29    Procedures Procedures   Medications Ordered in ED Medications  furosemide (LASIX) injection 40 mg (40 mg Intravenous Given 08/10/21 0822)  iohexol (OMNIPAQUE) 350 MG/ML injection 80 mL (80 mLs Intravenous Contrast Given 08/10/21 1036)  HYDROmorphone (DILAUDID) injection 1 mg (1 mg Intravenous Given 08/10/21 1233)    ED Course  I have reviewed the triage vital signs and the nursing notes.  Pertinent labs & imaging results that were available during my care of the patient were reviewed by me and considered in my medical  decision making (see chart for details).    MDM Rules/Calculators/A&P                           65year old female with a history of HLD, HTN, GERD, anxiety, depression, DM 2, CHF (last EF 60 to 65% on 30/97/3532with diastolic dysfunction) who presents to the emergency department with worsening shortness of breath.   She endorses an 11 pound weight gain from her dry weight over the last 7 days with worsening lower extremity edema.  She endorses paroxysmal nocturnal dyspnea and orthopnea.  She is unable to lie flat and sleeps in a recliner.  She she states that she has been taking her home torsemide as prescribed.  Despite her home p.o. diuretic, she continues to endorse worsening bilateral lower extremity swelling and worsening abdominal wall swelling.  She currently denies any chest pain.   Concern for CHF exacerbation with 2+ pitting edema, worsening dyspnea on exertion, orthopnea, paroxysmal nocturnal dyspnea, 11 pound weight gain over the last week despite her home torsemide use.  No chest pain to suggest ACS at this time. Additionally considered PNA, PTX, obesity hypoventilation syndrome.  Will administer IV Lasix, obtain EKG, chest x-ray, screening labs and reassess.  EKG was obtained which revealed normal sinus rhythm, ventricular rate 66, normal intervals, no ST-T changes.  Chest x-ray revealed persistent cardiac enlargement and central pulmonary vascular engorgement with no change from prior radiographs, no lobar consolidation, no signs of pleural effusion or frank edema.  Laboratory analysis: A BNP resulted normal at 22, delta troponins were  normal.  CBC was without a leukocytosis or anemia.  COVID-19 and influenza PCR testing resulted negative.  Urinalysis with moderate leukocytes, 6-10 WBCs, rare bacteria, negative nitrites.  Given the patient's symptoms of mild dysuria, could reflect early UTI.  Will provide prescription for Keflex.  Given the patient's persistent dyspnea, a D-dimer  was collected and resulted elevated after age adjustment to 1.3.  A CTA PE study was ordered.  Given the patient's reassuring EKG findings and negative delta troponins, low concern for ACS at this time.  Given the patient's laboratory and imaging evaluation, CHF exacerbation with diastolic dysfunction remains on the differential however patient is overall well-appearing.  CTA PE study resulted negative for acute PE.  No evidence of pneumothorax, pleural effusion, pericardial effusion, aortic dissection, focal consolidation to suggest bacterial pneumonia.  Given the patient's known CHF with diastolic dysfunction, reassuring laboratory work-up at this time points away from acute exacerbation necessitating hospital admission.    The patient has been appropriately medically screened and/or stabilized in the ED. I have low suspicion for any other emergent medical condition which would require further screening, evaluation or treatment in the ED or require inpatient management.  The patient has endorsed worsening difficulty with ADLs associated with her CHF.  Social work evaluated the patient and recommended home health PT, prescription for a walker to aid with ambulation at home.  The patient was also advised compression stockings and elevation of her bilateral lower extremities to assist with her lower extremity swelling.  She was advised to follow-up with her cardiologist in clinic to discuss continued long-term management of her CHF.   Final Clinical Impression(s) / ED Diagnoses Final diagnoses:  Peripheral edema  Shortness of breath  Acute on chronic diastolic congestive heart failure (HCC)  Acute cystitis without hematuria    Rx / DC Orders ED Discharge Orders          Ordered    cephALEXin (KEFLEX) 500 MG capsule  4 times daily        08/10/21 [place]        08/10/21 1327    Face-to-face encounter (required for Medicare/Medicaid patients)       Comments: I Regan Lemming  certify that this patient is under my care and that I, or a nurse practitioner or physician's assistant working with me, had a face-to-face encounter that meets the physician face-to-face encounter requirements with this patient on 08/10/2021. The encounter with the patient was in whole, or in part for the following medical condition(s) which is the primary reason for home health care (List medical condition): CHF   08/10/21 1327    Ambulatory Referral for DME        08/10/21 1329    Walker standard        08/10/21 1329    Compression stockings        08/10/21 1335             Regan Lemming, MD 08/10/21 1410

## 2021-08-10 NOTE — ED Triage Notes (Signed)
BIBA Per EMS: Pt coming from home w/ C/O increased swelling and pain in lower legs and abd pain x1week  Been on fluid pills since April which have been working for her until now; pt states increased urination and SHOB as well.  Vitals :  120/70  80 HR  24 RR 97% RA  16 CBG

## 2021-08-11 ENCOUNTER — Other Ambulatory Visit: Payer: Self-pay | Admitting: Cardiology

## 2021-08-12 DIAGNOSIS — E1169 Type 2 diabetes mellitus with other specified complication: Secondary | ICD-10-CM | POA: Diagnosis not present

## 2021-08-12 DIAGNOSIS — M7700 Medial epicondylitis, unspecified elbow: Secondary | ICD-10-CM | POA: Diagnosis not present

## 2021-08-12 DIAGNOSIS — I5030 Unspecified diastolic (congestive) heart failure: Secondary | ICD-10-CM | POA: Diagnosis not present

## 2021-08-12 DIAGNOSIS — I5031 Acute diastolic (congestive) heart failure: Secondary | ICD-10-CM | POA: Diagnosis not present

## 2021-08-12 DIAGNOSIS — E559 Vitamin D deficiency, unspecified: Secondary | ICD-10-CM | POA: Diagnosis not present

## 2021-08-13 ENCOUNTER — Telehealth: Payer: Self-pay | Admitting: Internal Medicine

## 2021-08-13 NOTE — Telephone Encounter (Signed)
[place] Name: [place] Name: Shelly Bombard Phone #: [phone] Service Requested: PT Frequency of Visits: 2week 3 1week 4 to work on gait, balance, & strength  Need orders for nurse to evaluate & treat disease management & med medication management  Also needs asst w/ finding community resources

## 2021-08-14 NOTE — Progress Notes (Signed)
Virtual Visit via Telephone Note   This visit type was conducted due to national recommendations for restrictions regarding the COVID-19 Pandemic (e.g. social distancing) in an effort to limit this patient's exposure and mitigate transmission in our community.  Due to her co-morbid illnesses, this patient is at least at moderate risk for complications without adequate follow up.  This format is felt to be most appropriate for this patient at this time.  The patient did not have access to video technology/had technical difficulties with video requiring transitioning to audio format only (telephone).  All issues noted in this document were discussed and addressed.  No physical exam could be performed with this format.  Please refer to the patient's chart for her  consent to telehealth for [facility].  Evaluation Performed:  Follow-up visit  This visit type was conducted due to national recommendations for restrictions regarding the COVID-19 Pandemic (e.g. social distancing).  This format is felt to be most appropriate for this patient at this time.  All issues noted in this document were discussed and addressed.  No physical exam was performed (except for noted visual exam findings with Video Visits).  Please refer to the patient's chart (MyChart message for video visits and phone note for telephone visits) for the patient's consent to telehealth for [place]  Date:  08/18/2021   ID:  Maria [name], DOB [birth year], MRN [id number]  Patient Location:  [place] [address]   Provider location:     [place] [address] Office [phone] Fax [phone]   PCP:  Biagio Borg, MD  Cardiologist:  Minus Breeding, MD  Electrophysiologist:  None   Chief Complaint: Follow-up for chronic diastolic CHF  History of Present Illness:    Maria [name] is a 65 y.o. female who presents via  audio/video conferencing for a telehealth visit today.  Patient verified DOB and address.  She has a PMH of hyperlipidemia, hypertension, GERD, anxiety, depression, type 2 diabetes, and CHF.  Echocardiogram showed an EF of 08-02% with diastolic dysfunction 2/33/6122.  She presented to the emergency department 08/10/2021 with increased shortness of breath.  She noted a 13 pound weight gain over the previous 7 days with worsening lower extremity edema.  She did note PND and orthopnea.  She was unable to lay flat and was sleeping in her recliner.  She reported taking torsemide as prescribed.  She denied chest discomfort.  She also denied infectious symptoms aside from increased shortness of breath and increased urinary frequency due to her diuretic.  She did note mild dysuria but denied fever and chills.  Her blood pressure at that time was 123/76 with a pulse of 66.  Her creatinine was 1.05.  Her EKG showed normal sinus rhythm.  She received IV furosemide.  Her chest x-ray showed persistent cardiac enlargement and central pulmonary vascular engorgement with no change from prior chest x-rays, no signs of lobar consolidation, pleural effusion, or frank edema.  Her BNP at that time was 22.  Troponins were normal.  It was also felt that she had a early UTI.  She was also prescribed Keflex.  Her CTA was negative for acute PE with no evidence of pneumo, pleural effusion, pericardial effusion, aortic dissection or focal consolidation.  Social work evaluated and recommended home physical therapy, prescription for walker.  It was also recommended that she use lower extremity support stockings and elevate her lower extremities  to assist with her lower extremity edema.  She was discharged in stable condition on 08/11/2021.  Due to her limits and mobility she is unable to present to the clinic.  She contacted today via phone and states she is frustrated with her lower extremity swelling.  She reports that she has ordered lower  extremity support stockings and is excited to give it a try.  She reports that she is receiving 1 meal per day from [employer] and does not adding salt to her food.  She has been consuming about 4 bottles of water per day along with tea.  We reviewed the importance of selecting food options that are mild salt and fluid restriction.  I recommended that she drink no more than about 48 ounces per day.  She has been taking her torsemide at about 9 or 10:00 and then again at 5 or 6:00 in the evening.  She has not been sleeping well due to frequent urination.  We discussed taking her medication earlier in the day in the morning and in the early afternoon.  I have encouraged her to continue to weigh her self daily and keep a log, I will give her the salty 6 diet sheet, give parameters for fluid restriction, and have her follow-up in 3 months and as needed.  Today she denies chest pain, shortness of breath, lower extremity edema, fatigue, palpitations, melena, hematuria, hemoptysis, diaphoresis, weakness, presyncope, syncope, orthopnea, and PND.   The patient does not symptoms concerning for COVID-19 infection (fever, chills, cough, or new SHORTNESS OF BREATH).    Prior CV studies:   The following studies were reviewed today:  Echocardiogram 03/08/2021 IMPRESSIONS     1. Left ventricular ejection fraction, by estimation, is 60 to 65%. The  left ventricle has normal function. The left ventricle has no regional  wall motion abnormalities. Left ventricular diastolic parameters are  consistent with Grade I diastolic  dysfunction (impaired relaxation). Elevated left ventricular end-diastolic  pressure.   2. Right ventricular systolic function is normal. The right ventricular  size is normal. Tricuspid regurgitation signal is inadequate for assessing  PA pressure.   3. The mitral valve is normal in structure. No evidence of mitral valve  regurgitation. No evidence of mitral stenosis.   4. The aortic  valve is normal in structure. Aortic valve regurgitation is  not visualized. No aortic stenosis is present.   5. The inferior vena cava is normal in size with greater than 50%  respiratory variability, suggesting right atrial pressure of 3 mmHg.   Past Medical History:  Diagnosis Date   Acute lymphadenitis 2011   ALLERGIC RHINITIS 08/10/2007   ANXIETY 12/06/2007   Atherosclerotic peripheral vascular disease ([place]) 06/13/2013   Aorta on CT June 2014   Cellulitis 2011   Cervical disc disease 03/09/2012   CHF (congestive heart failure) ([place])    Chronic pain 03/09/2012   DEPRESSION 12/06/2007   Diabetes ([place])    DIVERTICULOSIS, [place] 12/06/2007   GERD 12/06/2007   Hepatitis    age 66 hepatitis A   HLD (hyperlipidemia) 05/24/2019   HYPERTENSION 12/06/2007   Lumbar disc disease 03/09/2012   Mesenteric adenitis    MRSA 2006   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 2010   [place]  may 24th 2010   COLONOSCOPY WITH PROPOFOL N/A 07/10/2016   Procedure: COLONOSCOPY WITH PROPOFOL;  Surgeon: Manus Gunning, MD;  Location: [facility];  Service: Gastroenterology;  Laterality: N/A;   COLONOSCOPY WITH PROPOFOL N/A 09/26/2019   Procedure: COLONOSCOPY WITH PROPOFOL;  Surgeon: Yetta Flock, MD;  Location: [facility];  Service: Gastroenterology;  Laterality: N/A;   ESOPHAGOGASTRODUODENOSCOPY (EGD) WITH PROPOFOL N/A 07/10/2016   Procedure: ESOPHAGOGASTRODUODENOSCOPY (EGD) WITH PROPOFOL;  Surgeon: Manus Gunning, MD;  Location: [facility];  Service: Gastroenterology;  Laterality: N/A;   POLYPECTOMY  09/26/2019   Procedure: POLYPECTOMY;  Surgeon: Yetta Flock, MD;  Location: [facility];  Service: Gastroenterology;;   s/p ovary cyst     s/p right  knee arthroscopy     Dr. Mardelle Matte ortho     Current Meds  Medication Sig   Accu-Chek Softclix Lancets lancets Use as directed up to 4 times daily (Patient taking differently: 1 each by Other route in the morning, at noon, in the evening, and at bedtime.)   acetaminophen (TYLENOL) 325 MG tablet Take 650 mg by mouth every 6 (six) hours as needed for headache (pain).   albuterol (PROAIR HFA) 108 (90 Base) MCG/ACT inhaler Inhale 2 puffs into the lungs every 6 (six) hours as needed for wheezing or shortness of breath.   Blood Glucose Monitoring Suppl (ACCU-CHEK GUIDE) w/Device KIT Use as directed (Patient taking differently: 1 each by Other route as directed.)   Cholecalciferol (HM VITAMIN D3) 100 MCG (4000 UT) CAPS Take 4,000 Units by mouth daily.   clonazePAM (KLONOPIN) 1 MG tablet TAKE 1 TABLET BY MOUTH 2 TIMES A DAY AS NEEDED FOR ANXIETY (Patient taking differently: Take 1 mg by mouth 2 (two) times daily as needed for anxiety.)   cyclobenzaprine (FLEXERIL) 10 MG tablet TAKE 1 TABLET BY MOUTH 2 TIMES A DAY AS NEEDED FOR MUSCLE SPASMS (Patient taking differently: Take 10 mg by mouth 2 (two) times daily as needed for muscle spasms.)   Fexofenadine HCl (ALLEGRA PO) Take 180 mg by mouth as needed.   glucose blood (ACCU-CHEK GUIDE) test strip Use as instructed up to 4 times daily (Patient taking differently: Use as instructed up to 4 times daily)   LINZESS 145 MCG CAPS capsule Take 145 mcg by mouth daily as needed (constipation).   loperamide (IMODIUM) 2 MG capsule Take 2 mg by mouth as needed for diarrhea or loose stools.   metFORMIN (GLUCOPHAGE) 500 MG tablet Take 1 tablet (500 mg total) by mouth 2 (two) times daily with a meal.   metoprolol tartrate (LOPRESSOR) 25 MG tablet Take 1 tablet (25 mg total) by mouth 2 (two) times daily.   NARCAN 4 MG/0.1ML LIQD nasal spray kit Place 0.4 mg into the nose daily as needed (opioid overdose).   ondansetron (ZOFRAN ODT) 4 MG disintegrating tablet Take 1 tablet (4  mg total) by mouth every 4 (four) hours as needed for nausea or vomiting.   Oxycodone HCl 10 MG TABS Take 1 tablet (10 mg total) 4 (four) times daily by mouth. Per [facility] (Patient taking differently: Take 10 mg by mouth every 4 (four) hours as needed (pain). Per [facility])   pantoprazole (PROTONIX) 40 MG tablet TAKE ONE TABLET BY MOUTH DAILY (Patient taking differently: Take 40 mg by mouth daily.)   Polyethyl Glycol-Propyl Glycol 0.4-0.3 % SOLN Apply 1 drop to eye daily as needed.   polyethylene glycol powder (GLYCOLAX/MIRALAX) 17 GM/SCOOP powder Take 17 g by mouth daily  as needed for mild constipation.   potassium chloride SA (KLOR-CON) 20 MEQ tablet Take 1 tablet (20 mEq total) by mouth 2 (two) times daily.   rOPINIRole (REQUIP) 1 MG tablet Take 1 tablet (1 mg total) by mouth 3 (three) times daily.   Simethicone (GAS-X EXTRA STRENGTH PO) Take 1 tablet by mouth daily as needed (gas).   torsemide (DEMADEX) 20 MG tablet Take 2 tablets (40 mg total) by mouth 2 (two) times daily.     Allergies:   Amoxicillin-pot clavulanate, Adhesive [tape], Codeine, Crestor [rosuvastatin calcium], Lactose intolerance (gi), Morphine and related, Vicodin [hydrocodone-acetaminophen], and Latex   Social History   Tobacco Use   Smoking status: Former    Years: 30.00    Types: Cigarettes    Quit date: 11/08/2008    Years since quitting: 12.7   Smokeless tobacco: Never   Tobacco comments:    quit 10/09  Vaping Use   Vaping Use: Never used  Substance Use Topics   Alcohol use: No    Comment: quit drinking 10/07/1997   Drug use: No     Family Hx: The patient's family history includes Anxiety disorder in her sister; Asthma in her daughter and sister; Bipolar disorder in her daughter; Cancer in her maternal uncle and another family member; Depression in her sister; Diabetes in her mother; Heart disease in her mother; Heart failure in her mother; Hypertension in her father and mother; Liver  cancer in her paternal grandmother.  ROS:   Please see the history of present illness.     All other systems reviewed and are negative.   Labs/Other Tests and Data Reviewed:    Recent Labs: 12/31/2020: TSH 1.45 04/11/2021: Magnesium 2.1 07/07/2021: ALT 16 08/10/2021: B Natriuretic Peptide 22.3; BUN 19; Creatinine, Ser 1.05; Hemoglobin 12.5; Platelets 257; Potassium 3.8; Sodium 142   Recent Lipid Panel Lab Results  Component Value Date/Time   CHOL 147 07/07/2021 03:10 PM   TRIG 140.0 07/07/2021 03:10 PM   TRIG 126 12/31/2006 08:47 AM   HDL 43.30 07/07/2021 03:10 PM   CHOLHDL 3 07/07/2021 03:10 PM   LDLCALC 76 07/07/2021 03:10 PM   LDLDIRECT 127.0 01/25/2019 02:23 PM    Wt Readings from Last 3 Encounters:  08/18/21 (!) 366 lb (166 kg)  07/07/21 (!) 360 lb (163.3 kg)  05/09/21 (!) 361 lb (163.7 kg)     Exam:    Vital Signs:  BP (!) 144/72 (BP Location: Left Arm)   Pulse 62   Wt (!) 366 lb (166 kg)   BMI 60.91 kg/m    Well nourished, well developed female in no  acute distress.   ASSESSMENT & PLAN:    1.  Acute on chronic diastolic CHF-denies increased DOE or activity intolerance.  Weight today 366lbs.  Is slowly increasing her physical activity with the help of home physical therapy. Continue torsemide, potassium, metoprolol Heart healthy low-sodium diet-salty 6 given Increase physical activity as tolerated Lower extremity support stockings-[place] support stocking sheet Fluid restriction 48-64 ounces daily Elevate lower extremities when not active Daily weights-contact office with a weight increase of 3 pounds overnight or 5 pounds in 1 week.  Essential hypertension-BP today 130's/70's.  Well-controlled at home. Continue losartan, metoprolol Heart healthy low-sodium diet-salty 6 given Increase physical activity as tolerated  Hyperlipidemia-07/07/2021: Cholesterol 147; HDL 43.30; LDL Cholesterol 76; Triglycerides 140.0; VLDL 28.0 Heart healthy low-sodium  high-fiber diet. increase physical activity as tolerated Follows with PCP  Type 2 diabetes-hemoglobin A1c 6.1 on 07/07/2021 Continue metformin Heart  healthy low-sodium diet-salty 6 given Increase physical activity as tolerated Follows with PCP  Disposition: Follow-up with Dr. Percival Spanish in 3 months.  COVID-19 Education: The signs and symptoms of COVID-19 were discussed with the patient and how to seek care for testing (follow up with PCP or arrange E-visit).  The importance of social distancing was discussed today.  Patient Risk:   After full review of this patients clinical status, I feel that they are at least moderate risk at this time.  Time:   Today, I have spent 12  minutes with the patient with telehealth technology discussing medications, diet, stress, daily weights, past medical history, CHF, and prevention strategies.    Medication Adjustments/Labs and Tests Ordered: Current medicines are reviewed at length with the patient today.  Concerns regarding medicines are outlined above.   Tests Ordered: No orders of the defined types were placed in this encounter.  Medication Changes: No orders of the defined types were placed in this encounter.   Disposition:  in 3 month(s)  Signed, Jossie Ng. Donzell Coller NP-C    07/25/2019 11:58 AM    [place] [address] Office [phone] Fax [phone]

## 2021-08-15 NOTE — Telephone Encounter (Signed)
Verbal orders given to [place] to proceed.

## 2021-08-18 ENCOUNTER — Other Ambulatory Visit: Payer: Self-pay

## 2021-08-18 ENCOUNTER — Encounter: Payer: Self-pay | Admitting: General Practice

## 2021-08-18 ENCOUNTER — Telehealth (INDEPENDENT_AMBULATORY_CARE_PROVIDER_SITE_OTHER): Payer: Medicare Other | Admitting: General Practice

## 2021-08-18 VITALS — BP 144/72 | HR 62 | Wt 366.0 lb

## 2021-08-18 DIAGNOSIS — I5032 Chronic diastolic (congestive) heart failure: Secondary | ICD-10-CM

## 2021-08-18 DIAGNOSIS — E118 Type 2 diabetes mellitus with unspecified complications: Secondary | ICD-10-CM

## 2021-08-18 DIAGNOSIS — E782 Mixed hyperlipidemia: Secondary | ICD-10-CM | POA: Diagnosis not present

## 2021-08-18 DIAGNOSIS — I10 Essential (primary) hypertension: Secondary | ICD-10-CM | POA: Diagnosis not present

## 2021-08-18 NOTE — Patient Instructions (Signed)
Medication Instructions:  TAKE YOUR TORSEMIDE 1st THING WHEN YOU GET UP AND BETWEEN 12-2pm  *If you need a refill on your cardiac medications before your next appointment, please call your pharmacy*  Lab Work:   Testing/Procedures:  NONE    NONE  Special Instructions PLEASE READ AND FOLLOW SALTY 6-ATTACHED-1,'800mg'$  daily  FLUID RESTRICTION <48 oz DAILY  WEAR YOUR STOCKINGS DAILY-PUT ON IN THE AM(WHEN YOU GET DRESSED) AND TAKE OFF WHEN YOU PUT ON YOUR BED CLOTHES  NO MILK PRODUCTS OR ICE CREAM  MAY HAVE SUGAR-FREE GUM OR CANDY  Follow-Up: Your next appointment:  11-20-2021 @ 11AM HERE IN OUR OFFICE   with Minus Breeding, MD   At [facility], you and your health needs are our priority.  As part of our continuing mission to provide you with exceptional heart care, we have created designated Provider Care Teams.  These Care Teams include your primary Cardiologist (physician) and Advanced Practice Providers (APPs -  Physician Assistants and Nurse Practitioners) who all work together to provide you with the care you need, when you need it.            6 SALTY THINGS TO AVOID     1,'800MG'$  DAILY

## 2021-08-19 DIAGNOSIS — I5033 Acute on chronic diastolic (congestive) heart failure: Secondary | ICD-10-CM | POA: Diagnosis not present

## 2021-08-19 DIAGNOSIS — I872 Venous insufficiency (chronic) (peripheral): Secondary | ICD-10-CM | POA: Diagnosis not present

## 2021-08-19 DIAGNOSIS — I890 Lymphedema, not elsewhere classified: Secondary | ICD-10-CM | POA: Diagnosis not present

## 2021-08-19 DIAGNOSIS — E1151 Type 2 diabetes mellitus with diabetic peripheral angiopathy without gangrene: Secondary | ICD-10-CM | POA: Diagnosis not present

## 2021-08-19 DIAGNOSIS — I110 Hypertensive heart disease with heart failure: Secondary | ICD-10-CM | POA: Diagnosis not present

## 2021-08-21 DIAGNOSIS — I890 Lymphedema, not elsewhere classified: Secondary | ICD-10-CM | POA: Diagnosis not present

## 2021-08-21 DIAGNOSIS — I872 Venous insufficiency (chronic) (peripheral): Secondary | ICD-10-CM | POA: Diagnosis not present

## 2021-08-21 DIAGNOSIS — I110 Hypertensive heart disease with heart failure: Secondary | ICD-10-CM | POA: Diagnosis not present

## 2021-08-21 DIAGNOSIS — I5033 Acute on chronic diastolic (congestive) heart failure: Secondary | ICD-10-CM | POA: Diagnosis not present

## 2021-08-21 DIAGNOSIS — E1151 Type 2 diabetes mellitus with diabetic peripheral angiopathy without gangrene: Secondary | ICD-10-CM | POA: Diagnosis not present

## 2021-08-22 ENCOUNTER — Telehealth: Payer: Self-pay

## 2021-08-22 DIAGNOSIS — I110 Hypertensive heart disease with heart failure: Secondary | ICD-10-CM | POA: Diagnosis not present

## 2021-08-22 DIAGNOSIS — I872 Venous insufficiency (chronic) (peripheral): Secondary | ICD-10-CM | POA: Diagnosis not present

## 2021-08-22 DIAGNOSIS — I890 Lymphedema, not elsewhere classified: Secondary | ICD-10-CM | POA: Diagnosis not present

## 2021-08-22 DIAGNOSIS — E1151 Type 2 diabetes mellitus with diabetic peripheral angiopathy without gangrene: Secondary | ICD-10-CM | POA: Diagnosis not present

## 2021-08-22 DIAGNOSIS — I5033 Acute on chronic diastolic (congestive) heart failure: Secondary | ICD-10-CM | POA: Diagnosis not present

## 2021-08-22 NOTE — Telephone Encounter (Signed)
   Margarita Grizzle from [facility] calling for verbal order to move SW consult to 9/6 Phone [phone]

## 2021-08-22 NOTE — Telephone Encounter (Signed)
Ok for verbal 

## 2021-08-26 ENCOUNTER — Ambulatory Visit (INDEPENDENT_AMBULATORY_CARE_PROVIDER_SITE_OTHER): Payer: Medicare Other | Admitting: *Deleted

## 2021-08-26 ENCOUNTER — Telehealth: Payer: Self-pay

## 2021-08-26 DIAGNOSIS — I5032 Chronic diastolic (congestive) heart failure: Secondary | ICD-10-CM

## 2021-08-26 DIAGNOSIS — E1169 Type 2 diabetes mellitus with other specified complication: Secondary | ICD-10-CM

## 2021-08-26 MED ORDER — METFORMIN HCL 500 MG PO TABS: 500.0000 mg | ORAL_TABLET | Freq: Two times a day (BID) | ORAL | 1 refills | Status: DC

## 2021-08-26 NOTE — Telephone Encounter (Signed)
Verbals given 08/22/21

## 2021-08-26 NOTE — Telephone Encounter (Signed)
1.Medication Requested: Metformin  2. Pharmacy (Name, Street, [place]):[facility]   3. On Med List: yes  4. Last Visit with PCP: 07/07/2021

## 2021-08-27 NOTE — Chronic Care Management (AMB) (Signed)
Chronic Care Management   CCM RN Visit Note  08/27/2021 Name: Maria [name] MRN: [id number] DOB: 1956/01/31  Subjective: Maria [name] is a 65 y.o. year old female who is a primary care patient of Biagio Borg, MD. The care management team was consulted for assistance with disease management and care coordination needs.    Engaged with patient by telephone for follow up visit in response to provider referral for case management and/or care coordination services.   Consent to Services:  The patient was given information about Chronic Care Management services, agreed to services, and gave verbal consent prior to initiation of services.  Please see initial visit note for detailed documentation.  Patient agreed to services and verbal consent obtained.   Assessment: Review of patient past medical history, allergies, medications, health status, including review of consultants reports, laboratory and other test data, was performed as part of comprehensive evaluation and provision of chronic care management services.   CCM Care Plan  Allergies  Allergen Reactions   Amoxicillin-Pot Clavulanate Nausea And Vomiting    Projectile vomiting Did it involve swelling of the face/tongue/throat, SOB, or low BP? No Did it involve sudden or severe rash/hives, skin peeling, or any reaction on the inside of your mouth or nose? No Did you need to seek medical attention at a hospital or doctor's office? No When did it last happen?      20-30 years If all above answers are "NO", may proceed with cephalosporin use.    Adhesive [Tape] Other (See Comments)    Tears skin off - use paper tape    Codeine Other (See Comments)    hallucinations   Crestor [Rosuvastatin Calcium]     Severe muscle cramps   Lactose Intolerance (Gi) Nausea And Vomiting   Morphine And Related Nausea And Vomiting and Other (See Comments)    Migraine headaches   Vicodin [Hydrocodone-Acetaminophen] Other (See Comments)     hallucinations   Latex Rash   Outpatient Encounter Medications as of 08/26/2021  Medication Sig   Accu-Chek Softclix Lancets lancets Use as directed up to 4 times daily (Patient taking differently: 1 each by Other route in the morning, at noon, in the evening, and at bedtime.)   acetaminophen (TYLENOL) 325 MG tablet Take 650 mg by mouth every 6 (six) hours as needed for headache (pain).   albuterol (PROAIR HFA) 108 (90 Base) MCG/ACT inhaler Inhale 2 puffs into the lungs every 6 (six) hours as needed for wheezing or shortness of breath.   Blood Glucose Monitoring Suppl (ACCU-CHEK GUIDE) w/Device KIT Use as directed (Patient taking differently: 1 each by Other route as directed.)   Cholecalciferol (HM VITAMIN D3) 100 MCG (4000 UT) CAPS Take 4,000 Units by mouth daily.   clonazePAM (KLONOPIN) 1 MG tablet TAKE 1 TABLET BY MOUTH 2 TIMES A DAY AS NEEDED FOR ANXIETY (Patient taking differently: Take 1 mg by mouth 2 (two) times daily as needed for anxiety.)   cyclobenzaprine (FLEXERIL) 10 MG tablet TAKE 1 TABLET BY MOUTH 2 TIMES A DAY AS NEEDED FOR MUSCLE SPASMS (Patient taking differently: Take 10 mg by mouth 2 (two) times daily as needed for muscle spasms.)   Fexofenadine HCl (ALLEGRA PO) Take 180 mg by mouth as needed.   glucose blood (ACCU-CHEK GUIDE) test strip Use as instructed up to 4 times daily (Patient taking differently: Use as instructed up to 4 times daily)   LINZESS 145 MCG CAPS capsule Take 145 mcg by mouth daily as needed (  constipation).   loperamide (IMODIUM) 2 MG capsule Take 2 mg by mouth as needed for diarrhea or loose stools.   losartan (COZAAR) 100 MG tablet TAKE ONE TABLET BY MOUTH DAILY (Patient not taking: No sig reported)   metoprolol tartrate (LOPRESSOR) 25 MG tablet Take 1 tablet (25 mg total) by mouth 2 (two) times daily.   NARCAN 4 MG/0.1ML LIQD nasal spray kit Place 0.4 mg into the nose daily as needed (opioid overdose).   ondansetron (ZOFRAN ODT) 4 MG disintegrating tablet  Take 1 tablet (4 mg total) by mouth every 4 (four) hours as needed for nausea or vomiting.   Oxycodone HCl 10 MG TABS Take 1 tablet (10 mg total) 4 (four) times daily by mouth. Per [facility] (Patient taking differently: Take 10 mg by mouth every 4 (four) hours as needed (pain). Per [facility])   pantoprazole (PROTONIX) 40 MG tablet TAKE ONE TABLET BY MOUTH DAILY (Patient taking differently: Take 40 mg by mouth daily.)   Polyethyl Glycol-Propyl Glycol 0.4-0.3 % SOLN Apply 1 drop to eye daily as needed.   polyethylene glycol powder (GLYCOLAX/MIRALAX) 17 GM/SCOOP powder Take 17 g by mouth daily as needed for mild constipation.   potassium chloride SA (KLOR-CON) 20 MEQ tablet Take 1 tablet (20 mEq total) by mouth 2 (two) times daily.   rOPINIRole (REQUIP) 1 MG tablet Take 1 tablet (1 mg total) by mouth 3 (three) times daily.   Simethicone (GAS-X EXTRA STRENGTH PO) Take 1 tablet by mouth daily as needed (gas).   torsemide (DEMADEX) 20 MG tablet Take 2 tablets (40 mg total) by mouth 2 (two) times daily.   [DISCONTINUED] DULoxetine (CYMBALTA) 60 MG capsule Take 60 mg by mouth daily.     [DISCONTINUED] metFORMIN (GLUCOPHAGE) 500 MG tablet Take 1 tablet (500 mg total) by mouth 2 (two) times daily with a meal.   No facility-administered encounter medications on file as of 08/26/2021.   Patient Active Problem List   Diagnosis Date Noted   Nail disorder 07/07/2021   Pre-ulcerative corn or callous 07/07/2021   Rash 07/07/2021   AKI (acute kidney injury) ([place]) 16/09/9603   Diastolic congestive heart failure ([place]) 03/17/2021   Diabetes mellitus type 2 in obese ([place]) 54/08/8118   Acute diastolic CHF (congestive heart failure) ([place]) 03/07/2021   Acute on chronic diastolic (congestive) heart failure ([place]) 03/06/2021   Vitamin D deficiency 01/01/2021   Medial epicondylitis 12/31/2020   Aortic atherosclerosis ([place]) 14/78/2956   Umbilical hernia 21/30/8657   Constipation 08/05/2020    Arthritis 06/12/2020   Hoarseness 05/01/2020   Vitamin B 12 deficiency 01/31/2020   Rosacea 01/31/2020   Neck swelling 01/31/2020   History of colonic polyps    Benign neoplasm of [place]    Nausea 08/31/2019   Throat pain 07/26/2019   HLD (hyperlipidemia) 05/24/2019   Leg cramps 05/24/2019   Lump in neck 05/24/2019   Left thyroid nodule 01/25/2019   Jerking movements of extremities 07/20/2018   Balance disorder 07/20/2018   Right knee pain 03/21/2018   OSA (obstructive sleep apnea) 03/21/2018   Oxygen desaturation 03/21/2018   Urinary symptom or sign 03/21/2018   Acquired lymphedema 01/25/2018   Gait disorder 01/25/2018   Peripheral edema 01/16/2018   SOB (shortness of breath) 01/15/2018   Dysphonia 12/07/2017   Encounter for well adult exam with abnormal findings 11/08/2017   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   Tow polyp 08/11/2016   Abdominal pain, epigastric    Dysphagia    [place]  cancer screening    ACE-inhibitor cough 12/05/2015   Restless legs syndrome 10/08/2015   Venous stasis dermatitis of both lower extremities 04/26/2015   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/17/2014   AR (allergic rhinitis) 10/17/2014   Fatty liver 10/17/2014   Chronic pain syndrome 09/19/2014   Post-traumatic osteoarthritis of both knees 09/19/2014   Spinal stenosis 09/19/2014   Depression, major, single episode, complete remission ([place]) 09/19/2014   Narcotic dependence ([place]) 09/19/2014   Spondylosis of 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 03/09/2012   Lumbar disc disease 03/09/2012   Chronic pain 03/09/2012   Morbid obesity ([place]) 06/17/2011   Chronic low back pain 12/28/2008   Anxiety state 12/06/2007   Depression 12/06/2007   Benign hypertension 12/06/2007   Gastroesophageal reflux disease without esophagitis 12/06/2007   LOW BACK PAIN 12/06/2007   Conditions to be addressed/monitored:  CHF and DMII  Care Plan : Heart Failure (Adult)  Updates made by [name] Royalty, RN since 08/27/2021 12:00 AM     Problem: Symptom Exacerbation (Heart Failure) Deleted 08/27/2021  Priority: High     Long-Range Goal: Symptom Exacerbation Prevented or Minimized Completed 08/26/2021  Start Date: 04/25/2021  Expected End Date: 10/26/2021  This Visit's Progress: On track  Recent Progress: On track  Priority: High  Note:   Current Barriers:  Knowledge deficit related to basic heart failure pathophysiology and self care management Multiple recent hospitalizations for CHF exacerbation Case Manager Clinical Goal(s):  08/26/21: RN CCM CM Care plan converted to new format; goals re-established and extended accordingly- please see RN CM care plan developed 08/26/21 04/25/21: Over the next 6 months, as evidenced by patient reporting during CCM RMN CM outreach, patient will: Monitor and record daily weights at home (notifying MD of 3 lb weight gain over night or 5 lb in a week) Independently verbalize understanding of Heart Failure Action Plan and when to call doctor Take all heart failure mediations as prescribed    Care Plan : Diabetes Type 2 (Adult)  Updates made by [name] Royalty, RN since 08/27/2021 12:00 AM     Problem: Glycemic Management (Diabetes, Type 2) Resolved 08/27/2021  Priority: Medium     Long-Range Goal: Glycemic Management Optimized Completed 08/26/2021  Start Date: 04/25/2021  Expected End Date: 10/26/2021  This Visit's Progress: On track  Recent Progress: On track  Priority: Medium  Note:   Objective:  Lab Results  Component Value Date   HGBA1C 6.5 (H) 03/07/2021   Lab Results  Component Value Date   CREATININE 1.15 04/18/2021   CREATININE 1.22 (H) 04/11/2021   CREATININE 1.39 (H) 04/10/2021   No results found for: EGFR Current Barriers:  Knowledge Deficits related to basic Diabetes pathophysiology and self care/management New to blood sugar monitoring at home Case Manager Clinical Goal(s):  CCM RN CM Care plan format converted 08/26/21- goals  re-established and extended 04/25/21: Over the next 6 months, patient will demonstrate improved adherence to prescribed treatment plan for diabetes self care/management as evidenced by:  daily monitoring/ recording of blood sugars at home adherence to ADA/ carb modified diet  adherence to prescribed medication regimen  contacting provider for new or worsened symptoms or questions    Care Plan : [place] of Care  Updates made by [name] Royalty, RN since 08/27/2021 12:00 AM     Problem: Chronic Disease Management Needs   Priority: Medium     Long-Range Goal: Development of plan of care for long term chronic disease management  Start Date: 08/26/2021  Expected End Date: 08/26/2022  Priority: Medium  Note:   Current Barriers:  Chronic Disease Management support and education needs related to CHF and DMII Fragile state of health, multiple progressing chronic health conditions Recent in-patient hospitalizations x 2 in Spring 2022 for CHF exacerbation/ weight gain at home, shortness of breath  RNCM Clinical Goal(s):  CCM RN CM Care plan format converted 08/26/21 Patient will demonstrate ongoing health management independence for CHF and DMII  through collaboration with RN Care manager, provider, and care team.   Interventions: 1:1 collaboration with primary care provider regarding development and update of comprehensive plan of care as evidenced by provider attestation and co-signature Inter-disciplinary care team collaboration (see longitudinal plan of care) Evaluation of current treatment plan related to  self management and patient's adherence to plan as established by provider Chart reviewed including relevant office notes, upcoming scheduled appointments, and lab results  Heart Failure Interventions:  (Status: Goal on track: YES.) Basic overview and discussion of pathophysiology of Heart Failure reviewed; Discussed importance of daily weight and advised patient to weigh and  record daily; Reviewed role of diuretics in prevention of fluid overload and management of heart failure; Discussed the importance of keeping all appointments with provider; Advised patient to discuss her concern with her current diuretic- she believes her Toresemide is not pulling fluid off effectively with provider; Confirmed patient has spoken to [facility] Pharmacist, referred at time of last RN CM outreach Reviewed with patient her recent daily weights at home: she reports frequent overnight weight gain > 3-5 lbs, despite taking diuretic as prescribed: reports weight this morning of 369 lbs, yesterday 366 lbs- she denies shortness of breath, states lower extremity swelling "a little bit" increased from yesterday; reports general/ recent weights at home between 365-370 lbs Very upset that she is taking prescribed diuretic as instructed but continues to gain weight; states she is in the bathroom "all day long peeing," and yet still gains overnight/ weekly weight: states that if she were to call cardiology for each day she gained > 3 lbs overnight, she would be calling them "3 or 4 times a week" Reviewed with patient recent cardiology office (virtual) visit: confirms she is following low sodium, heart healthy diet; following fluid restrictions, taking medications as prescribed, wearing compression stockings during daytime; confirms she is taking diuretic in am and late afternoon as recommended by cardiology provider; confirms plans to attend 11/20/21 in-person cardiology provider visit- encouraged her to discuss her concerns and frustrations with cardiology provider: I will message cardiology and CCM pharmacist as FYI Re-iterated signs/ symptoms CHF yellow zone, importance of daily assessments at home, action plan for yellow CHF zone: patient remains able to verbalize all with minimal prompting  Diabetes:  (Status: Goal on track: YES.) Lab Results  Component Value Date   HGBA1C 6.1 07/07/2021  Assessed  patient's understanding of A1c goal: <6.5% Reviewed prescribed diet with patient low carb/ low sugar; heart healthy, low salt, fluid restricted- <48 ounces per day; confirms patient following diet recommendations; Discussed plans with patient for ongoing care management follow up and provided patient with direct contact information for care management team;      Reviewed scheduled/upcoming provider appointments including: 08/28/21- pain management clinic; 10/31/21- [place] team; 11/20/21- cardiology in-person office visit; confirmed patient has plans to attend all as scheduled- continues using transportation benefit of insurance provider ;         Review of patient status, including review of consultants reports, relevant laboratory  and other test results, and medications completed;       Reviewed recent blood sugars at home: monitoring at least QD fasting: reports recent ranges at home consistently between 102-110- encouraged her ongoing monitoring/ recording of daily blood sugars Confirmed for patient that office staff at PCP office appears from review of EHR to be working on her need for re-fill of metformin  Patient Goals/Self-Care Activities: Patient will self administer medications as prescribed Patient will attend all scheduled provider appointments Patient will call pharmacy for medication refills Patient will call provider office for new concerns or questions Patient will keep up the great work monitoring and writing down on paper daily weights at home: today you reported a weight of 369 lbs Patient will continue to follow prescribed diet: low carb/ low sugar; heart healthy/ low salt; and will follow fluid restrictions of < 48 ounces per day Patient will continue to talk to your cardiology provider about the concerns you have with your fluid pill not pulling off fluid effectively Patient will watch daily for increased feeling of shortness of breath, swelling in feet, ankles and legs  every day: if you have an increased amount of shortness of breath or swelling, make a note of it and let your cardiology provider know if it persists Patient will continue to keep legs up while sitting and wear compression stockings during daytime hours Patient will stay as active as possible without over-doing: rest if you start to feel tired or "winded"/ short of breath Patient will continue to monitor and write down on paper daily fasting blood sugars        Plan: Telephone follow up appointment with care management team member scheduled for:  Monday, November 24, 2021 at 1:00 pm The patient has been provided with contact information for the care management team and has been advised to call with any health related questions or concerns.    [name], RN, BSN, [facility] RN Care Coordination- [place] [phone]: direct office [phone]: mobile

## 2021-08-27 NOTE — Patient Instructions (Signed)
Visit [place], it was nice talking with you today.   I look forward to talking to you again for an update on Monday, November 24, 2021 at 1:00 pm- please be listening out for my call that day.  I will call as close to 1:00 pm as possible.   If you need to cancel or re-schedule our telephone visit, please call [phone] and one of our care guides will be happy to assist you.   I look forward to hearing about your progress.   Please don't hesitate to contact me if I can be of assistance to you before our next scheduled telephone appointment.   [name], RN, BSN, [facility] RN Care Coordination- [place] [phone]: direct office [phone]: mobile   PATIENT GOALS:  Goals Addressed             This Visit's Progress               Patient Self-Care Activities   On track    Timeframe:  Long-Range Goal Priority:  Medium Start Date:       08/26/21- goals from previous care plan converted to RN CCM CM format; re-established and extended                      Expected End Date: 08/26/22  Patient will self administer medications as prescribed Patient will attend all scheduled provider appointments Patient will call pharmacy for medication refills Patient will call provider office for new concerns or questions Patient will keep up the great work monitoring and writing down on paper daily weights at home: today you reported a weight of 369 lbs Patient will continue to follow prescribed diet: low carb/ low sugar; heart healthy/ low salt; and will follow fluid restrictions of < 48 ounces per day Patient will continue to talk to your cardiology provider about the concerns you have with your fluid pill not pulling off fluid effectively Patient will watch daily for increased feeling of shortness of breath, swelling in feet, ankles and legs every day: if you have an increased amount of shortness of breath or swelling, make a note of it and  let your cardiology provider know if it persists Patient will continue to keep legs up while sitting and wear compression stockings during daytime hours Patient will stay as active as possible without over-doing: rest if you start to feel tired or "winded"/ short of breath Patient will continue to monitor and write down on paper daily fasting blood sugars                    Living With Heart Failure Heart failure is a long-term (chronic) condition in which the heart cannot pump enough blood through the body. When this happens, parts of the body do not get the blood and oxygen they need. There is no cure for heart failure at this time, so it is important for you to take good care of yourself and follow the treatment plan you set with your health care provider. If you are living with heart failure, there are ways to help you manage the disease. How to manage lifestyle changes Living with heart failure requires you to make changes in your life. Your health care team will teach you about the changes you need to make in order to relieve your symptoms and lower your risk of going to the hospital. Work with your health care provider to develop  a treatment plan that works for you. Activity Ask your health care provider about attending cardiac rehabilitation. These programs include aerobic physical activity, which provides many benefits for your heart. If no cardiac rehabilitation program is available, ask your health care provider what aerobic exercises are safe for you to do. Return to your normal activities as told by your health care provider. Ask your health care provider what activities are safe for you. Pace your daily activities and allow time for rest as needed. Managing stress It is normal to have many emotions about your diagnosis, such as fear, sadness, anger, and loss. If you feel any of these emotions and need help coping, contact your health care provider. Here are some ways to help  yourself manage these emotions: Talk to friends and family members about your condition. They can give you support and guidance. Explain your symptoms to them and, if comfortable, invite them to attend appointments or rehabilitation with you. Join a support group for people with chronic heart failure. Talking with other people who have the same symptoms may give you new ways of coping with your disease and your emotions. Accept help from others. Do not be ashamed if you need help with certain tasks. Use stress management techniques, such as meditation, breathing exercises, or listening to relaxing music. Conditions such as depression and anxiety are common in persons with heart failure. Pay attention to changes in your mood, emotions, and stress levels. Tell your health care provider if you have any of the following symptoms: Trouble sleeping or a change in your sleeping patterns. Feeling sad, down, or depressed more often than not, every day for more than 2 weeks. Losing interest in activities you normally enjoy. Feeling irritable or crying for no reason. Finding yourself worrying about the future often. Work You may need to develop a plan with your health care provider if heart failure interferes with your ability to work. This may include: Reducing your work hours. Finding functions that are less active or require less effort. Planning rest periods during your work hours. Travel Talk with your health care provider if you plan to travel. There may be circumstances in which your health care provider recommends that you do not travel or that you delay travel until your condition is under control. When you travel, bring your medicine and a list of your medicines. If you are traveling by air, keep your medicines with you in a carry-on bag. Consider finding a medical facility in the area you will be traveling to and determine what your health insurance will cover. If you will be traveling by public  transportation (airplane, train, bus), contact the company prior to traveling if you have special needs. This may include needs related to diet, oxygen, a wheelchair, a seating request, or help with luggage. If you use oxygen, make sure to bring enough oxygen with you. If you have a battery-powered device, bring a fully charged extra battery with you. If you have a device, bring a note from your health care provider and inform all security screening personnel that you have the device. You may need to go through special screening for safety. Sexual activity  Ask your health care provider when it is safe for you to resume sexual activity. You may need to start slowly and gradually increase intimacy. You can increase intimacy by doing such things as caressing, touching, and holding each other. Get regular exercise as told by your health care provider. This can benefit your sex life  by building strength and endurance. Sleep If your condition interferes with your sleep, find ways to improve your sleep quality, such as: Sleep lying on your side, or sleep with your head elevated by raising the head of your bed or using multiple pillows. Ask your health care provider about screening for sleep apnea. Try to go to sleep and wake up at the same times every day. Sleep in a dark, cool room. Do not do any physical activity or eating for a few hours before bedtime. Plan rest periods during the day, but do not take long naps during the day.  Where to find support Consider talking with: Family members. Close friends. A mental health professional or therapist. A member of your church, faith, or community group. Other sources of support include: Local support groups. Ask your health care provider about groups near you. Online support groups, such as those found through the American Heart Association: supportnetwork.heart.org Local home care agencies, community agencies, or social agencies. A palliative care  specialist. Palliative care can help you manage symptoms, promote comfort, improve quality of life, and maintain dignity. Where to find more information American Heart Association: heart.org National Heart, Lung, and Blood Institute: https://www.hartman-hill.biz/ Centers for Disease Control and Prevention: https://www.reeves.com/ [place]: SolutionApps.it Contact a health care provider if: You have a rapid weight gain. You have increasing shortness of breath that is unusual for you. You are unable to participate in your usual physical activities. You tire easily. You have difficulty sleeping, such as: You wake up feeling short of breath. You have to use more pillows to raise your head in order to sleep. You cough more than normal, especially with physical activity. You have any swelling or more swelling in areas such as your hands, feet, ankles, or abdomen. You become dizzy or light-headed when you stand up. You have changes to your appetite. You have symptoms of depression or anxiety. Get help right away if: You have difficulty breathing. You notice or your family notices a change in your awareness, such as having trouble staying awake or having difficulty with concentration. You have pain or discomfort in your chest. You have an episode of fainting (syncope). You feel like your heart is beating quickly (palpitations). You have extreme feelings of sadness or loss of hope, or you have thoughts about hurting yourself or others. These symptoms may represent a serious problem that is an emergency. Do not wait to see if the symptoms will go away. Get medical help right away. Call your local emergency services (911 in the [place] Do not drive yourself to the hospital. Summary There is no cure for heart failure, so it is important for you to take good care of yourself and follow the treatment plan set by your health care provider. Ask your health care provider about attending cardiac  rehabilitation. These programs include aerobic physical activity, which provides many benefits for your heart. It is normal to have many emotions about your diagnosis, such as fear, sadness, anger, and loss. If you feel any of these emotions and need help coping, contact your health care provider. You may need to develop a plan with your health care provider if heart failure interferes with your ability to work. This information is not intended to replace advice given to you by your health care provider. Make sure you discuss any questions you have with your health care provider. Document Revised: 07/22/2020 Document Reviewed: 07/22/2020 Elsevier Patient Education  2022 [employer].   Patient  verbalizes understanding of instructions provided today and agrees to view in MyChart Telephone follow up appointment with care management team member scheduled for:  Monday, November 24, 2021 at 1:00 pm The patient has been provided with contact information for the care management team and has been advised to call with any health related questions or concerns   [name], RN, BSN, [place] [phone]: direct office [phone]: mobile

## 2021-08-28 DIAGNOSIS — M542 Cervicalgia: Secondary | ICD-10-CM | POA: Diagnosis not present

## 2021-08-28 DIAGNOSIS — J321 Chronic frontal sinusitis: Secondary | ICD-10-CM | POA: Diagnosis not present

## 2021-08-28 DIAGNOSIS — J3089 Other allergic rhinitis: Secondary | ICD-10-CM | POA: Diagnosis not present

## 2021-08-28 DIAGNOSIS — M25562 Pain in left knee: Secondary | ICD-10-CM | POA: Diagnosis not present

## 2021-08-28 DIAGNOSIS — M545 Low back pain, unspecified: Secondary | ICD-10-CM | POA: Diagnosis not present

## 2021-08-28 DIAGNOSIS — M79661 Pain in right lower leg: Secondary | ICD-10-CM | POA: Diagnosis not present

## 2021-08-28 DIAGNOSIS — G894 Chronic pain syndrome: Secondary | ICD-10-CM | POA: Diagnosis not present

## 2021-08-28 DIAGNOSIS — M25551 Pain in right hip: Secondary | ICD-10-CM | POA: Diagnosis not present

## 2021-08-28 DIAGNOSIS — Z79891 Long term (current) use of opiate analgesic: Secondary | ICD-10-CM | POA: Diagnosis not present

## 2021-08-28 DIAGNOSIS — M25561 Pain in right knee: Secondary | ICD-10-CM | POA: Diagnosis not present

## 2021-08-28 DIAGNOSIS — M25552 Pain in left hip: Secondary | ICD-10-CM | POA: Diagnosis not present

## 2021-08-28 DIAGNOSIS — M1711 Unilateral primary osteoarthritis, right knee: Secondary | ICD-10-CM | POA: Diagnosis not present

## 2021-08-28 DIAGNOSIS — M25512 Pain in left shoulder: Secondary | ICD-10-CM | POA: Diagnosis not present

## 2021-08-29 ENCOUNTER — Telehealth: Payer: Self-pay | Admitting: Cardiology

## 2021-08-29 ENCOUNTER — Ambulatory Visit: Payer: Medicare Other | Admitting: *Deleted

## 2021-08-29 DIAGNOSIS — I890 Lymphedema, not elsewhere classified: Secondary | ICD-10-CM | POA: Diagnosis not present

## 2021-08-29 DIAGNOSIS — I5033 Acute on chronic diastolic (congestive) heart failure: Secondary | ICD-10-CM | POA: Diagnosis not present

## 2021-08-29 DIAGNOSIS — I872 Venous insufficiency (chronic) (peripheral): Secondary | ICD-10-CM | POA: Diagnosis not present

## 2021-08-29 DIAGNOSIS — I110 Hypertensive heart disease with heart failure: Secondary | ICD-10-CM | POA: Diagnosis not present

## 2021-08-29 DIAGNOSIS — E1169 Type 2 diabetes mellitus with other specified complication: Secondary | ICD-10-CM

## 2021-08-29 DIAGNOSIS — E1151 Type 2 diabetes mellitus with diabetic peripheral angiopathy without gangrene: Secondary | ICD-10-CM | POA: Diagnosis not present

## 2021-08-29 DIAGNOSIS — I5032 Chronic diastolic (congestive) heart failure: Secondary | ICD-10-CM

## 2021-08-29 NOTE — Telephone Encounter (Signed)
Pt c/o swelling: STAT is pt has developed SOB within 24 hours  If swelling, where is the swelling located? Knees, legs, stomach   How much weight have you gained and in what time span? 9 pounds in 3 days  Have you gained 3 pounds in a day or 5 pounds in a week? yes  Do you have a log of your daily weights (if so, list)? 9/3 366, 9/4 363, 9/5 366, 9/6 369, 9/7 363, 9/9 372  Are you currently taking a fluid pill? yes  Are you currently SOB? Yes when moving around   Have you traveled recently? No

## 2021-08-29 NOTE — Patient Instructions (Signed)
Visit [place], it was nice talking with you today.   Please read over the attached information, and monitor your weights at home each day as well as for signs of dehydration while you are taking the "extra" doses of your fluid pill  As we discussed- continue to follow the established action plan for weight gain, as your cardiology team as recommended: contact your cardiology team for weight gain that is more than 3 lbs overnight or 5 lbs in one week   I look forward to talking to you again for an update on November 24, 2021 at 1:00 pm- please be listening out for my call that day.  I will call as close to 1:00 pm as possible.   If you need to cancel or re-schedule our telephone visit, please call [phone] and one of our care guides will be happy to assist you.   I look forward to hearing about your progress.   Please don't hesitate to contact me if I can be of assistance to you before our next scheduled telephone appointment.   [name], RN, BSN, [facility] RN Care Coordination- [place] [phone]: direct office [phone]: mobile   PATIENT GOALS:  Goals Addressed             This Visit's Progress    Patient Self-Care Activities   On track    Timeframe:  Long-Range Goal Priority:  Medium Start Date:       08/26/21- goals from previous care plan converted to RN CCM CM format; re-established and extended                      Expected End Date: 08/26/22  Patient will self administer medications as prescribed Patient will attend all scheduled provider appointments Patient will call pharmacy for medication refills Patient will call provider office for new concerns or questions Patient will keep up the great work monitoring and writing down on paper daily weights at home: today you reported a weight of 372 lbs Patient will continue to follow prescribed diet: low carb/ low sugar; heart healthy/ low salt; and will follow fluid  restrictions of < 48 ounces per day Patient will continue to talk to your cardiology provider about the concerns you have with your fluid pill not pulling off fluid effectively Patient will watch daily for increased feeling of shortness of breath, swelling in feet, ankles and legs every day: if you have an increased amount of shortness of breath or swelling, make a note of it and let your cardiology provider know if it persists Patient will continue to keep legs up while sitting and wear compression stockings during daytime hours Patient will stay as active as possible without over-doing: rest if you start to feel tired or "winded"/ short of breath Patient will continue to monitor and write down on paper daily fasting blood sugars         Dehydration, Adult Dehydration is condition in which there is not enough water or other fluids in the body. This happens when a person loses more fluids than he or she takes in. Important body parts cannot work right without the right amount of fluids. Any loss of fluids from the body can cause dehydration. Dehydration can be mild, worse, or very bad. It should be treated right away to keep it from getting very bad. What are the causes? This condition may be caused by: Conditions that cause loss of water  or other fluids, such as: Watery poop (diarrhea). Vomiting. Sweating a lot. Peeing (urinating) a lot. Not drinking enough fluids, especially when you: Are ill. Are doing things that take a lot of energy to do. Other illnesses and conditions, such as fever or infection. Certain medicines, such as medicines that take extra fluid out of the body (diuretics). Lack of safe drinking water. Not being able to get enough water and food. What increases the risk? The following factors may make you more likely to develop this condition: Having a long-term (chronic) illness that has not been treated the right way, such as: Diabetes. Heart disease. Kidney  disease. Being 77 years of age or older. Having a disability. Living in a place that is high above the ground or sea (high in altitude). The thinner, dried air causes more fluid loss. Doing exercises that put stress on your body for a long time. What are the signs or symptoms? Symptoms of dehydration depend on how bad it is. Mild or worse dehydration Thirst. Dry lips or dry mouth. Feeling dizzy or light-headed, especially when you stand up from sitting. Muscle cramps. Your body making: Dark pee (urine). Pee may be the color of tea. Less pee than normal. Less tears than normal. Headache. Very bad dehydration Changes in skin. Skin may: Be cold to the touch (clammy). Be blotchy or pale. Not go back to normal right after you lightly pinch it and let it go. Little or no tears, pee, or sweat. Changes in vital signs, such as: Fast breathing. Low blood pressure. Weak pulse. Pulse that is more than 100 beats a minute when you are sitting still. Other changes, such as: Feeling very thirsty. Eyes that look hollow (sunken). Cold hands and feet. Being mixed up (confused). Being very tired (lethargic) or having trouble waking from sleep. Short-term weight loss. Loss of consciousness. How is this treated? Treatment for this condition depends on how bad it is. Treatment should start right away. Do not wait until your condition gets very bad. Very bad dehydration is an emergency. You will need to go to a hospital. Mild or worse dehydration can be treated at home. You may be asked to: Drink more fluids. Drink an oral rehydration solution (ORS). This drink helps get the right amounts of fluids and salts and minerals in the blood (electrolytes). Very bad dehydration can be treated: With fluids through an IV tube. By getting normal levels of salts and minerals in your blood. This is often done by giving salts and minerals through a tube. The tube is passed through your nose and into your  stomach. By treating the root cause. Follow these instructions at home: Oral rehydration solution If told by your doctor, drink an ORS: Make an ORS. Use instructions on the package. Start by drinking small amounts, about  cup (120 mL) every 5-10 minutes. Slowly drink more until you have had the amount that your doctor said to have. Eating and drinking     Drink enough clear fluid to keep your pee pale yellow. If you were told to drink an ORS, finish the ORS first. Then, start slowly drinking other clear fluids. Drink fluids such as: Water. Do not drink only water. Doing that can make the salt (sodium) level in your body get too low. Water from ice chips you suck on. Fruit juice that you have added water to (diluted). Low-calorie sports drinks. Eat foods that have the right amounts of salts and minerals, such as: Bananas. Oranges. Potatoes. Tomatoes.  Spinach. Do not drink alcohol. Avoid: Drinks that have a lot of sugar. These include: High-calorie sports drinks. Fruit juice that you did not add water to. Soda. Caffeine. Foods that are greasy or have a lot of fat or sugar. General instructions Take over-the-counter and prescription medicines only as told by your doctor. Do not take salt tablets. Doing that can make the salt level in your body get too high. Return to your normal activities as told by your doctor. Ask your doctor what activities are safe for you. Keep all follow-up visits as told by your doctor. This is important. Contact a doctor if: You have pain in your belly (abdomen) and the pain: Gets worse. Stays in one place. You have a rash. You have a stiff neck. You get angry or annoyed (irritable) more easily than normal. You are more tired or have a harder time waking than normal. You feel: Weak or dizzy. Very thirsty. Get help right away if you have: Any symptoms of very bad dehydration. Symptoms of vomiting, such as: You cannot eat or drink without  vomiting. Your vomiting gets worse or does not go away. Your vomit has blood or green stuff in it. Symptoms that get worse with treatment. A fever. A very bad headache. Problems with peeing or pooping (having a bowel movement), such as: Watery poop that gets worse or does not go away. Blood in your poop (stool). This may cause poop to look black and tarry. Not peeing in 6-8 hours. Peeing only a small amount of very dark pee in 6-8 hours. Trouble breathing. These symptoms may be an emergency. Do not wait to see if the symptoms will go away. Get medical help right away. Call your local emergency services (911 in the [place] Do not drive yourself to the hospital. Summary Dehydration is a condition in which there is not enough water or other fluids in the body. This happens when a person loses more fluids than he or she takes in. Treatment for this condition depends on how bad it is. Treatment should be started right away. Do not wait until your condition gets very bad. Drink enough clear fluid to keep your pee pale yellow. If you were told to drink an oral rehydration solution (ORS), finish the ORS first. Then, start slowly drinking other clear fluids. Take over-the-counter and prescription medicines only as told by your doctor. Get help right away if you have any symptoms of very bad dehydration. This information is not intended to replace advice given to you by your health care provider. Make sure you discuss any questions you have with your health care provider. Document Revised: 07/20/2019 Document Reviewed: 07/20/2019 Elsevier Patient Education  2022 [place].  Patient verbalizes understanding of instructions provided today and agrees to view in [place].  Telephone follow up appointment with care management team member scheduled for: November 24, 2021 at 1:00 pm The patient has been provided with contact information for the care management team and has been advised to call with any  health related questions or concerns.   [name], RN, BSN, [facility] RN Care Coordination- [place] [phone]: direct office [phone]: mobile

## 2021-08-29 NOTE — Chronic Care Management (AMB) (Signed)
Chronic Care Management   CCM RN Visit Note  08/29/2021 Name: Maria [name] MRN: [id number] DOB: 1956/09/30  Subjective: Maria [name] is a 65 y.o. year old female who is a primary care patient of Biagio Borg, MD. The care management team was consulted for assistance with disease management and care coordination needs.    Engaged with patient by telephone for acute call/ follow up visit in response to provider referral for case management and/or care coordination services.   Consent to Services:  The patient was given information about Chronic Care Management services, agreed to services, and gave verbal consent prior to initiation of services.  Please see initial visit note for detailed documentation.  Patient agreed to services and verbal consent obtained.   Assessment: Review of patient past medical history, allergies, medications, health status, including review of consultants reports, laboratory and other test data, was performed as part of comprehensive evaluation and provision of chronic care management services.   CCM Care Plan Allergies  Allergen Reactions   Amoxicillin-Pot Clavulanate Nausea And Vomiting    Projectile vomiting Did it involve swelling of the face/tongue/throat, SOB, or low BP? No Did it involve sudden or severe rash/hives, skin peeling, or any reaction on the inside of your mouth or nose? No Did you need to seek medical attention at a hospital or doctor's office? No When did it last happen?      20-30 years If all above answers are "NO", may proceed with cephalosporin use.    Adhesive [Tape] Other (See Comments)    Tears skin off - use paper tape    Codeine Other (See Comments)    hallucinations   Crestor [Rosuvastatin Calcium]     Severe muscle cramps   Lactose Intolerance (Gi) Nausea And Vomiting   Morphine And Related Nausea And Vomiting and Other (See Comments)    Migraine headaches   Vicodin [Hydrocodone-Acetaminophen] Other (See Comments)     hallucinations   Latex Rash   Outpatient Encounter Medications as of 08/29/2021  Medication Sig   Accu-Chek Softclix Lancets lancets Use as directed up to 4 times daily (Patient taking differently: 1 each by Other route in the morning, at noon, in the evening, and at bedtime.)   acetaminophen (TYLENOL) 325 MG tablet Take 650 mg by mouth every 6 (six) hours as needed for headache (pain).   albuterol (PROAIR HFA) 108 (90 Base) MCG/ACT inhaler Inhale 2 puffs into the lungs every 6 (six) hours as needed for wheezing or shortness of breath.   Blood Glucose Monitoring Suppl (ACCU-CHEK GUIDE) w/Device KIT Use as directed (Patient taking differently: 1 each by Other route as directed.)   Cholecalciferol (HM VITAMIN D3) 100 MCG (4000 UT) CAPS Take 4,000 Units by mouth daily.   clonazePAM (KLONOPIN) 1 MG tablet TAKE 1 TABLET BY MOUTH 2 TIMES A DAY AS NEEDED FOR ANXIETY (Patient taking differently: Take 1 mg by mouth 2 (two) times daily as needed for anxiety.)   cyclobenzaprine (FLEXERIL) 10 MG tablet TAKE 1 TABLET BY MOUTH 2 TIMES A DAY AS NEEDED FOR MUSCLE SPASMS (Patient taking differently: Take 10 mg by mouth 2 (two) times daily as needed for muscle spasms.)   Fexofenadine HCl (ALLEGRA PO) Take 180 mg by mouth as needed.   glucose blood (ACCU-CHEK GUIDE) test strip Use as instructed up to 4 times daily (Patient taking differently: Use as instructed up to 4 times daily)   LINZESS 145 MCG CAPS capsule Take 145 mcg by mouth daily as  needed (constipation).   loperamide (IMODIUM) 2 MG capsule Take 2 mg by mouth as needed for diarrhea or loose stools.   losartan (COZAAR) 100 MG tablet TAKE ONE TABLET BY MOUTH DAILY (Patient not taking: No sig reported)   metFORMIN (GLUCOPHAGE) 500 MG tablet Take 1 tablet (500 mg total) by mouth 2 (two) times daily with a meal.   metoprolol tartrate (LOPRESSOR) 25 MG tablet Take 1 tablet (25 mg total) by mouth 2 (two) times daily.   NARCAN 4 MG/0.1ML LIQD nasal spray kit Place  0.4 mg into the nose daily as needed (opioid overdose).   ondansetron (ZOFRAN ODT) 4 MG disintegrating tablet Take 1 tablet (4 mg total) by mouth every 4 (four) hours as needed for nausea or vomiting.   Oxycodone HCl 10 MG TABS Take 1 tablet (10 mg total) 4 (four) times daily by mouth. Per [facility] (Patient taking differently: Take 10 mg by mouth every 4 (four) hours as needed (pain). Per [facility])   pantoprazole (PROTONIX) 40 MG tablet TAKE ONE TABLET BY MOUTH DAILY (Patient taking differently: Take 40 mg by mouth daily.)   Polyethyl Glycol-Propyl Glycol 0.4-0.3 % SOLN Apply 1 drop to eye daily as needed.   polyethylene glycol powder (GLYCOLAX/MIRALAX) 17 GM/SCOOP powder Take 17 g by mouth daily as needed for mild constipation.   potassium chloride SA (KLOR-CON) 20 MEQ tablet Take 1 tablet (20 mEq total) by mouth 2 (two) times daily.   rOPINIRole (REQUIP) 1 MG tablet Take 1 tablet (1 mg total) by mouth 3 (three) times daily.   Simethicone (GAS-X EXTRA STRENGTH PO) Take 1 tablet by mouth daily as needed (gas).   torsemide (DEMADEX) 20 MG tablet Take 2 tablets (40 mg total) by mouth 2 (two) times daily.   [DISCONTINUED] DULoxetine (CYMBALTA) 60 MG capsule Take 60 mg by mouth daily.     No facility-administered encounter medications on file as of 08/29/2021.   Patient Active Problem List   Diagnosis Date Noted   Nail disorder 07/07/2021   Pre-ulcerative corn or callous 07/07/2021   Rash 07/07/2021   AKI (acute kidney injury) ([place]) 89/38/1017   Diastolic congestive heart failure ([place]) 03/17/2021   Diabetes mellitus type 2 in obese ([place]) 51/01/5851   Acute diastolic CHF (congestive heart failure) ([place]) 03/07/2021   Acute on chronic diastolic (congestive) heart failure ([place]) 03/06/2021   Vitamin D deficiency 01/01/2021   Medial epicondylitis 12/31/2020   Aortic atherosclerosis ([place]) 77/82/4235   Umbilical hernia 36/14/4315   Constipation 08/05/2020   Arthritis 06/12/2020    Hoarseness 05/01/2020   Vitamin B 12 deficiency 01/31/2020   Rosacea 01/31/2020   Neck swelling 01/31/2020   History of colonic polyps    Benign neoplasm of [place]    Nausea 08/31/2019   Throat pain 07/26/2019   HLD (hyperlipidemia) 05/24/2019   Leg cramps 05/24/2019   Lump in neck 05/24/2019   Left thyroid nodule 01/25/2019   Jerking movements of extremities 07/20/2018   Balance disorder 07/20/2018   Right knee pain 03/21/2018   OSA (obstructive sleep apnea) 03/21/2018   Oxygen desaturation 03/21/2018   Urinary symptom or sign 03/21/2018   Acquired lymphedema 01/25/2018   Gait disorder 01/25/2018   Peripheral edema 01/16/2018   SOB (shortness of breath) 01/15/2018   Dysphonia 12/07/2017   Encounter for well adult exam with abnormal findings 11/08/2017   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   [place] polyp 08/11/2016   Abdominal pain, epigastric    Dysphagia    [place]  cancer screening    ACE-inhibitor cough 12/05/2015   Restless legs syndrome 10/08/2015   Venous stasis dermatitis of both lower extremities 04/26/2015   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/17/2014   AR (allergic rhinitis) 10/17/2014   Fatty liver 10/17/2014   Chronic pain syndrome 09/19/2014   Post-traumatic osteoarthritis of both knees 09/19/2014   Spinal stenosis 09/19/2014   Depression, major, single episode, complete remission ([place]) 09/19/2014   Narcotic dependence ([place]) 09/19/2014   Spondylosis of 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 03/09/2012   Lumbar disc disease 03/09/2012   Chronic pain 03/09/2012   Morbid obesity ([place]) 06/17/2011   Chronic low back pain 12/28/2008   Anxiety state 12/06/2007   Depression 12/06/2007   Benign hypertension 12/06/2007   Gastroesophageal reflux disease without esophagitis 12/06/2007   LOW BACK PAIN 12/06/2007   Conditions to be addressed/monitored:  CHF and DMII  Care Plan : RN Care Manager Plan of Care  Updates made by [name] Royalty, RN since  08/29/2021 12:00 AM     Problem: Chronic Disease Management Needs   Priority: Medium     Long-Range Goal: Development of plan of care for long term chronic disease management   Start Date: 08/26/2021  Expected End Date: 08/26/2022  Priority: Medium  Note:   Current Barriers:  Chronic Disease Management support and education needs related to CHF and DMII Fragile state of health, multiple progressing chronic health conditions Recent in-patient hospitalizations x 2 in Spring 2022 for CHF exacerbation/ weight gain at home, shortness of breath  RNCM Clinical Goal(s):  CCM RN CM Care plan format converted 08/26/21 Patient will demonstrate ongoing health management independence for CHF and DMII  through collaboration with RN Care manager, provider, and care team.   Interventions: 1:1 collaboration with primary care provider regarding development and update of comprehensive plan of care as evidenced by provider attestation and co-signature Inter-disciplinary care team collaboration (see longitudinal plan of care) Evaluation of current treatment plan related to  self management and patient's adherence to plan as established by provider Chart reviewed including relevant office notes, upcoming scheduled appointments, and lab results  08/29/21: received acute call/ voice mail message from patient 08/28/21 reporting ongoing weight gain > 3- 5 lbs overnight; in her voice message, patient reported she was in route to pain management provider office visit; stated that she would not be able to receive incoming calls yesterday, stated she would go to ED if problems developed overnight.  Returned patient's call this afternoon to check-in on status: Confirms she has continued to gain weight "all week long" reports weight yesterday of 366 lbs; today 372 lbs, accompanied by increased abdominal and lower extremity swelling Denies shortness of breath, no breathing difficulty noted on phone, patient speaks in complete  sentences without apparent distress Confirms that she followed established action plan to contact cardiology provider today: instructed to increase diuretic x 2 - 3 days (from 40 mg to 60 mg BID)- confirms she is following instructions as per cardiology team, provided today Discussed need to monitor for signs/ symptoms dehydration: provided verbal/ written instructions around same, along with action plan, with consideration of established fluid restrictions Discussed plan of action for ongoing problems over weekend/ off-hours: patient verbalizes ongoing good general understanding of action plan and when she should seek urgent/ emergent care Encouraged patient to continue to outreach cardiology team as per established action plan for yellow CHF zone- positive reinforcement provided to patient for following established action plan today   From 08/26/21: Heart Failure  Interventions:  (Status: Goal on track: YES.) Basic overview and discussion of pathophysiology of Heart Failure reviewed; Discussed importance of daily weight and advised patient to weigh and record daily; Reviewed role of diuretics in prevention of fluid overload and management of heart failure; Discussed the importance of keeping all appointments with provider; Advised patient to discuss her concern with her current diuretic- she believes her Toresemide is not pulling fluid off effectively with provider; Confirmed patient has spoken to [facility] Pharmacist, referred at time of last RN CM outreach Reviewed with patient her recent daily weights at home: she reports frequent overnight weight gain > 3-5 lbs, despite taking diuretic as prescribed: reports weight this morning of 369 lbs, yesterday 366 lbs- she denies shortness of breath, states lower extremity swelling "a little bit" increased from yesterday; reports general/ recent weights at home between 365-370 lbs Very upset that she is taking prescribed diuretic as instructed but continues to  gain weight; states she is in the bathroom "all day long peeing," and yet still gains overnight/ weekly weight: states that if she were to call cardiology for each day she gained > 3 lbs overnight, she would be calling them "3 or 4 times a week" Reviewed with patient recent cardiology office (virtual) visit: confirms she is following low sodium, heart healthy diet; following fluid restrictions, taking medications as prescribed, wearing compression stockings during daytime; confirms she is taking diuretic in am and late afternoon as recommended by cardiology provider; confirms plans to attend 11/20/21 in-person cardiology provider visit- encouraged her to discuss her concerns and frustrations with cardiology provider: I will message cardiology and CCM pharmacist as FYI Re-iterated signs/ symptoms CHF yellow zone, importance of daily assessments at home, action plan for yellow CHF zone: patient remains able to verbalize all with minimal prompting  Diabetes:  (Status: Goal on track: YES.) Lab Results  Component Value Date   HGBA1C 6.1 07/07/2021  Assessed patient's understanding of A1c goal: <6.5% Reviewed prescribed diet with patient low carb/ low sugar; heart healthy, low salt, fluid restricted- <48 ounces per day; confirms patient following diet recommendations; Discussed plans with patient for ongoing care management follow up and provided patient with direct contact information for care management team;      Reviewed scheduled/upcoming provider appointments including: 08/28/21- pain management clinic; 10/31/21- [place] team; 11/20/21- cardiology in-person office visit; confirmed patient has plans to attend all as scheduled- continues using transportation benefit of insurance provider ;         Review of patient status, including review of consultants reports, relevant laboratory and other test results, and medications completed;       Reviewed recent blood sugars at home: monitoring at least QD  fasting: reports recent ranges at home consistently between 102-110- encouraged her ongoing monitoring/ recording of daily blood sugars Confirmed for patient that office staff at PCP office appears from review of EHR to be working on her need for re-fill of metformin  Patient Goals/Self-Care Activities: Patient will self administer medications as prescribed Patient will attend all scheduled provider appointments Patient will call pharmacy for medication refills Patient will call provider office for new concerns or questions Patient will keep up the great work monitoring and writing down on paper daily weights at home: today you reported a weight of 369 lbs Patient will continue to follow prescribed diet: low carb/ low sugar; heart healthy/ low salt; and will follow fluid restrictions of < 48 ounces per day Patient will continue to talk to your cardiology provider  about the concerns you have with your fluid pill not pulling off fluid effectively Patient will watch daily for increased feeling of shortness of breath, swelling in feet, ankles and legs every day: if you have an increased amount of shortness of breath or swelling, make a note of it and let your cardiology provider know if it persists Patient will continue to keep legs up while sitting and wear compression stockings during daytime hours Patient will stay as active as possible without over-doing: rest if you start to feel tired or "winded"/ short of breath Patient will continue to monitor and write down on paper daily fasting blood sugars        Plan: Telephone follow up appointment with care management team member scheduled for:  November 24, 2021 at 1:00 pm The patient has been provided with contact information for the care management team and has been advised to call with any health related questions or concerns  [name], RN, BSN, [place] [phone]: direct  office [phone]: mobile

## 2021-08-29 NOTE — Telephone Encounter (Signed)
Called patient back with Dr. Victorino December , DOD, recommendations. Per Dr. Sallyanne Kuster, if patient is currently taking torsemide 40 mg twice daily (which she is). Okay to increase this to 60 mg twice daily until she loses the extra 6  pounds. After that, return to her usual prescription. Patient verbalized understanding. Patient will continue to check her daily weights and if this does not help, she will call back.

## 2021-08-29 NOTE — Telephone Encounter (Signed)
Called patient back about her message patient stated she has gained 9 pounds in 7 days, but it's actually 6  pounds because her weight has gone up and down during this past week. Patient stated usually she is running to the bathroom every 15 minutes when she takes her torsemide, but she only went 6 to 7 times the whole day yesterday. Patient also complaining of BLE edema and pain in her right calf. Patient stated also that she has been SOB when up and at times with resting. Will forward to the DOD today, for advisement, since Dr. Percival Spanish is out today.

## 2021-09-01 ENCOUNTER — Telehealth: Payer: Self-pay | Admitting: Cardiology

## 2021-09-01 DIAGNOSIS — I110 Hypertensive heart disease with heart failure: Secondary | ICD-10-CM | POA: Diagnosis not present

## 2021-09-01 DIAGNOSIS — I872 Venous insufficiency (chronic) (peripheral): Secondary | ICD-10-CM | POA: Diagnosis not present

## 2021-09-01 DIAGNOSIS — I890 Lymphedema, not elsewhere classified: Secondary | ICD-10-CM | POA: Diagnosis not present

## 2021-09-01 DIAGNOSIS — I5033 Acute on chronic diastolic (congestive) heart failure: Secondary | ICD-10-CM | POA: Diagnosis not present

## 2021-09-01 DIAGNOSIS — E1151 Type 2 diabetes mellitus with diabetic peripheral angiopathy without gangrene: Secondary | ICD-10-CM | POA: Diagnosis not present

## 2021-09-01 NOTE — Telephone Encounter (Signed)
Returned call to patient of Dr. Percival Spanish - seen on 8/29, triage note 9/9 She was advised on 9/9 to increase torsemide to '60mg'$  BID until she lost the 6lbs of extra fluid.  She reports she is down to 370lbs She reports her UOP is OK, but was better when she first started on torsemide  She reports swelling persists in feet, legs stomach  She states her stomach feels very full -- feels like she can't eat or drink anything She limit limit fluid restriction to 48oz daily She c/o weakness and reports shortness of breath with miminal exertion but does not say that this is new/worse  Her [facility] nurse told her that her lungs were clear  Patient concerned about her low BP -- 112/49, 1122/43 HR is in the mid-low 60s since she started on metoprolol tartrate   Advised will send a message to MD to review/advise

## 2021-09-01 NOTE — Telephone Encounter (Signed)
Home Health nurse called and stated that patient is still experiencing abdominal tightness and low blood pressure of 112/48; 64

## 2021-09-02 DIAGNOSIS — I890 Lymphedema, not elsewhere classified: Secondary | ICD-10-CM | POA: Diagnosis not present

## 2021-09-02 DIAGNOSIS — E1151 Type 2 diabetes mellitus with diabetic peripheral angiopathy without gangrene: Secondary | ICD-10-CM | POA: Diagnosis not present

## 2021-09-02 DIAGNOSIS — I110 Hypertensive heart disease with heart failure: Secondary | ICD-10-CM | POA: Diagnosis not present

## 2021-09-02 DIAGNOSIS — I872 Venous insufficiency (chronic) (peripheral): Secondary | ICD-10-CM | POA: Diagnosis not present

## 2021-09-02 DIAGNOSIS — I5033 Acute on chronic diastolic (congestive) heart failure: Secondary | ICD-10-CM | POA: Diagnosis not present

## 2021-09-03 MED ORDER — METOLAZONE 2.5 MG PO TABS
ORAL_TABLET | ORAL | 0 refills | Status: DC
Start: 2021-09-03 — End: 2021-09-09

## 2021-09-03 NOTE — Telephone Encounter (Signed)
Follow Up:      Patient is calling back, she wanted to know what to do, she is still swelling.

## 2021-09-03 NOTE — Telephone Encounter (Signed)
Received a call from patient she stated she has gained 7 lbs over night.She has increased swelling in both lower legs and abdomen.She is sob.Stated she is taking Torsemide 20 mg 3 tablets twice a day since 08/29/21. Spoke to DOD Dr.Croitoru he advised take Metolazone 2.5 mg now.Continue Torsemide 20 mg 3 tablets twice a day.Advised she needs appointment this week in office. Patient stated she cannot come this week.Stated she does not drive.She has to give transportation a 3 days notice.Appointment scheduled with Almyra Deforest PA 9/20 at 2:15 pm.Advised if she does not lose weight she will need to go to ED.

## 2021-09-04 DIAGNOSIS — I110 Hypertensive heart disease with heart failure: Secondary | ICD-10-CM | POA: Diagnosis not present

## 2021-09-04 DIAGNOSIS — E1151 Type 2 diabetes mellitus with diabetic peripheral angiopathy without gangrene: Secondary | ICD-10-CM | POA: Diagnosis not present

## 2021-09-04 DIAGNOSIS — I890 Lymphedema, not elsewhere classified: Secondary | ICD-10-CM | POA: Diagnosis not present

## 2021-09-04 DIAGNOSIS — I5033 Acute on chronic diastolic (congestive) heart failure: Secondary | ICD-10-CM | POA: Diagnosis not present

## 2021-09-04 DIAGNOSIS — I872 Venous insufficiency (chronic) (peripheral): Secondary | ICD-10-CM | POA: Diagnosis not present

## 2021-09-08 DIAGNOSIS — E1151 Type 2 diabetes mellitus with diabetic peripheral angiopathy without gangrene: Secondary | ICD-10-CM | POA: Diagnosis not present

## 2021-09-08 DIAGNOSIS — I5033 Acute on chronic diastolic (congestive) heart failure: Secondary | ICD-10-CM | POA: Diagnosis not present

## 2021-09-08 DIAGNOSIS — I110 Hypertensive heart disease with heart failure: Secondary | ICD-10-CM | POA: Diagnosis not present

## 2021-09-08 DIAGNOSIS — I890 Lymphedema, not elsewhere classified: Secondary | ICD-10-CM | POA: Diagnosis not present

## 2021-09-08 DIAGNOSIS — I872 Venous insufficiency (chronic) (peripheral): Secondary | ICD-10-CM | POA: Diagnosis not present

## 2021-09-09 ENCOUNTER — Ambulatory Visit (INDEPENDENT_AMBULATORY_CARE_PROVIDER_SITE_OTHER): Payer: Medicare Other | Admitting: Physician Assistant

## 2021-09-09 ENCOUNTER — Other Ambulatory Visit: Payer: Self-pay

## 2021-09-09 VITALS — BP 134/68 | HR 70 | Ht 65.0 in | Wt 367.6 lb

## 2021-09-09 DIAGNOSIS — E785 Hyperlipidemia, unspecified: Secondary | ICD-10-CM | POA: Diagnosis not present

## 2021-09-09 DIAGNOSIS — I10 Essential (primary) hypertension: Secondary | ICD-10-CM | POA: Diagnosis not present

## 2021-09-09 DIAGNOSIS — I5032 Chronic diastolic (congestive) heart failure: Secondary | ICD-10-CM

## 2021-09-09 DIAGNOSIS — R002 Palpitations: Secondary | ICD-10-CM | POA: Diagnosis not present

## 2021-09-09 DIAGNOSIS — E119 Type 2 diabetes mellitus without complications: Secondary | ICD-10-CM | POA: Diagnosis not present

## 2021-09-09 DIAGNOSIS — Z79899 Other long term (current) drug therapy: Secondary | ICD-10-CM | POA: Diagnosis not present

## 2021-09-09 MED ORDER — LOSARTAN POTASSIUM 50 MG PO TABS: 50.0000 mg | ORAL_TABLET | Freq: Every day | ORAL | 3 refills | Status: DC

## 2021-09-09 NOTE — Progress Notes (Signed)
Cardiology Office Note:    Date:  09/11/2021   ID:  Maria [name], DOB [birth year], MRN [id number]  PCP:  Biagio Borg, MD   [place] [facility] Cardiologist:  Minus Breeding, MD     Referring MD: Biagio Borg, MD   Chief Complaint  Patient presents with   Follow-up    Seen for Dr. Percival Spanish    History of Present Illness:    Maria [name] is a 65 y.o. female with a hx of hypertension, hyperlipidemia, DM2, anxiety/depression, morbid obesity, GERD and a history of chronic diastolic CHF.  Previous CTA of the chest in 2020 showed coronary calcification in the LAD territory.  TSH was normal in January.  Echocardiogram in March 2022 showed EF 60 to 24% with diastolic dysfunction.  She was in the hospital in late April with abdominal pain and cardiology service saw her for acute on chronic diastolic heart failure.  Her metolazone was discontinued and the Lasix switched to torsemide.  She was seen back in the ED on 05/01/2021 with volume overload.  She presented to the ED in August 2022 with increased shortness of breath.  She was noted to have a 13 pound weight gain and worsening lower extremity edema.  She was unable to lay flat and sleeps in her recliner.  EKG showed normal sinus rhythm.  She received IV diuretic.  She was also treated for early UTI with Keflex.  CTA was negative for acute PE, pericardial effusion or pleural effusion.  She was recommended to start using lower extremity support stockings and elevate lower extremity to assist with edema.  Since her discharge, she was seen by Coletta Memos, NP on 08/18/2021 via virtual visit.  She was receiving 1 meal per day from [employer] and was careful with no additional salt.  The late that day was 366 pounds.  Patient recently called the cardiology service regarding weight gain, she was instructed to increase torsemide to 60 mg twice a day until she can lose extra 6 pounds then go back to 40 mg twice a day thereafter.  She called  cardiology service again on 9/12 complaining of weight gain despite increasing torsemide to 60 mg twice a day.  Dr. Sallyanne Kuster DOD at the time recommended a dose of metolazone 2.5 mg.  Patient presents today for follow-up.  Even after she took the 2.5 of metolazone, she did not notice significant urine output.  She is still currently taking torsemide at 60 mg twice a day.  Her weight at home is also around 367 pounds.  Looking at her recent weight diary, her weight has been ranging from 363 pounds up to 375 pounds.  On physical exam, she does not have any significant lower extremity edema.  Her lungs is very much clear.  She appears to be euvolemic on the physical exam.  I recommended continue on the current dose of the torsemide and obtain basic metabolic panel to make sure her renal function is tolerating the current dose.  She is having leg cramps, I am concerned of low potassium level, I will obtain basic metabolic panel today.  She has occasional palpitation that may last from a few seconds to a few minutes.  If her electrolyte is abnormal, I will try to replete the electrolyte.  If her electrolyte is normal and she still have palpitation, I may consider a heart monitor in the future.  Otherwise she has a follow-up already scheduled to see Dr. Percival Spanish on December 1  Past Medical History:  Diagnosis Date   Acute lymphadenitis 2011   ALLERGIC RHINITIS 08/10/2007   ANXIETY 12/06/2007   Atherosclerotic peripheral vascular disease ([place]) 06/13/2013   Aorta on CT June 2014   Cellulitis 2011   Cervical disc disease 03/09/2012   CHF (congestive heart failure) ([place])    Chronic pain 03/09/2012   DEPRESSION 12/06/2007   Diabetes ([place])    DIVERTICULOSIS, [place] 12/06/2007   GERD 12/06/2007   Hepatitis    age 77 hepatitis A   HLD (hyperlipidemia) 05/24/2019   HYPERTENSION 12/06/2007   Lumbar disc disease 03/09/2012   Mesenteric adenitis    MRSA 2006   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 2010   [place]  may 24th 2010   COLONOSCOPY WITH PROPOFOL N/A 07/10/2016   Procedure: COLONOSCOPY WITH PROPOFOL;  Surgeon: Manus Gunning, MD;  Location: [facility];  Service: Gastroenterology;  Laterality: N/A;   COLONOSCOPY WITH PROPOFOL N/A 09/26/2019   Procedure: COLONOSCOPY WITH PROPOFOL;  Surgeon: Yetta Flock, MD;  Location: [facility];  Service: Gastroenterology;  Laterality: N/A;   ESOPHAGOGASTRODUODENOSCOPY (EGD) WITH PROPOFOL N/A 07/10/2016   Procedure: ESOPHAGOGASTRODUODENOSCOPY (EGD) WITH PROPOFOL;  Surgeon: Manus Gunning, MD;  Location: [facility];  Service: Gastroenterology;  Laterality: N/A;   POLYPECTOMY  09/26/2019   Procedure: POLYPECTOMY;  Surgeon: Yetta Flock, MD;  Location: [facility];  Service: Gastroenterology;;   s/p ovary cyst     s/p right knee arthroscopy     Dr. Mardelle Matte ortho    Current Medications: Current Meds  Medication Sig   Accu-Chek Softclix Lancets lancets Use as directed up to 4 times daily (Patient taking differently: 1 each by Other route in the morning, at noon, in the evening, and at bedtime.)   acetaminophen (TYLENOL) 325 MG tablet Take 650 mg by mouth every 6 (six) hours as needed for headache (pain).   albuterol (PROAIR HFA) 108 (90 Base) MCG/ACT inhaler Inhale 2 puffs into the lungs every 6 (six) hours as needed for wheezing or shortness of breath.   Blood Glucose Monitoring Suppl (ACCU-CHEK GUIDE) w/Device KIT Use as directed (Patient taking differently: 1 each by Other route as directed.)   Cholecalciferol (HM VITAMIN D3) 100 MCG (4000 UT) CAPS Take 4,000 Units by mouth daily.   clonazePAM (KLONOPIN) 1 MG tablet TAKE 1 TABLET BY MOUTH 2 TIMES A DAY AS NEEDED FOR ANXIETY (Patient taking  differently: Take 1 mg by mouth 2 (two) times daily as needed for anxiety.)   cyclobenzaprine (FLEXERIL) 10 MG tablet TAKE 1 TABLET BY MOUTH 2 TIMES A DAY AS NEEDED FOR MUSCLE SPASMS (Patient taking differently: Take 10 mg by mouth 2 (two) times daily as needed for muscle spasms.)   Fexofenadine HCl (ALLEGRA PO) Take 180 mg by mouth as needed.   glucose blood (ACCU-CHEK GUIDE) test strip Use as instructed up to 4 times daily (Patient taking differently: Use as instructed up to 4 times daily)   loperamide (IMODIUM) 2 MG capsule Take 2 mg by mouth as needed for diarrhea or loose stools.   metFORMIN (GLUCOPHAGE) 500 MG tablet Take 1 tablet (500 mg total) by mouth 2 (two) times daily with a meal.   metoprolol tartrate (LOPRESSOR) 25 MG tablet Take 1 tablet (25 mg  total) by mouth 2 (two) times daily.   NARCAN 4 MG/0.1ML LIQD nasal spray kit Place 0.4 mg into the nose daily as needed (opioid overdose).   ondansetron (ZOFRAN ODT) 4 MG disintegrating tablet Take 1 tablet (4 mg total) by mouth every 4 (four) hours as needed for nausea or vomiting.   Oxycodone HCl 10 MG TABS Take 1 tablet (10 mg total) 4 (four) times daily by mouth. Per [facility] (Patient taking differently: Take 10 mg by mouth every 4 (four) hours as needed (pain). Per [facility])   pantoprazole (PROTONIX) 40 MG tablet TAKE ONE TABLET BY MOUTH DAILY (Patient taking differently: Take 40 mg by mouth daily.)   Polyethyl Glycol-Propyl Glycol 0.4-0.3 % SOLN Apply 1 drop to eye daily as needed.   polyethylene glycol powder (GLYCOLAX/MIRALAX) 17 GM/SCOOP powder Take 17 g by mouth daily as needed for mild constipation.   potassium chloride SA (KLOR-CON) 20 MEQ tablet Take 1 tablet (20 mEq total) by mouth 2 (two) times daily.   rOPINIRole (REQUIP) 1 MG tablet Take 1 tablet (1 mg total) by mouth 3 (three) times daily.   Simethicone (GAS-X EXTRA STRENGTH PO) Take 1 tablet by mouth daily as needed (gas).   [DISCONTINUED] losartan  (COZAAR) 100 MG tablet TAKE ONE TABLET BY MOUTH DAILY   [DISCONTINUED] torsemide (DEMADEX) 20 MG tablet Take 60 mg by mouth 2 (two) times daily.     Allergies:   Amoxicillin-pot clavulanate, Adhesive [tape], Codeine, Crestor [rosuvastatin calcium], Lactose intolerance (gi), Morphine and related, Vicodin [hydrocodone-acetaminophen], and Latex   Social History   Socioeconomic History   Marital status: Divorced    Spouse name: Not on file   Number of children: 2   Years of education: Not on file   Highest education level: Not on file  Occupational History   Occupation: disabled  Tobacco Use   Smoking status: Former    Years: 30.00    Types: Cigarettes    Quit date: 11/08/2008    Years since quitting: 12.8   Smokeless tobacco: Never   Tobacco comments:    quit 10/09  Vaping Use   Vaping Use: Never used  Substance and Sexual Activity   Alcohol use: No    Comment: quit drinking 10/07/1997   Drug use: No   Sexual activity: Not Currently    Birth control/protection: Surgical, Abstinence  Other Topics Concern   Not on file  Social History Narrative   Patient has difficulty financially affording food, medication, and affording essential bills. She states she has limited support from her daughter.    Social Determinants of Health   Financial Resource Strain: Medium Risk   Difficulty of Paying Living Expenses: Somewhat hard  Food Insecurity: No Food Insecurity   Worried About Charity fundraiser in the Last Year: Never true   Ran Out of Food in the Last Year: Never true  Transportation Needs: No Transportation Needs   Lack of Transportation (Medical): No   Lack of Transportation (Non-Medical): No  Physical Activity: Inactive   Days of Exercise per Week: 0 days   Minutes of Exercise per Session: 0 min  Stress: No Stress Concern Present   Feeling of Stress : Not at all  Social Connections: Unknown   Frequency of Communication with Friends and Family: More than three times a  week   Frequency of Social Gatherings with Friends and Family: More than three times a week   Attends Religious Services: Patient refused   Active Member  of Clubs or Organizations: Patient refused   Attends Archivist Meetings: Patient refused   Marital Status: Never married     Family History: The patient's family history includes Anxiety disorder in her sister; Asthma in her daughter and sister; Bipolar disorder in her daughter; Cancer in her maternal uncle and another family member; Depression in her sister; Diabetes in her mother; Heart disease in her mother; Heart failure in her mother; Hypertension in her father and mother; Liver cancer in her paternal grandmother.  ROS:   Please see the history of present illness.     All other systems reviewed and are negative.  EKGs/Labs/Other Studies Reviewed:    The following studies were reviewed today:  Echo 03/08/2021  1. Left ventricular ejection fraction, by estimation, is 60 to 65%. The  left ventricle has normal function. The left ventricle has no regional  wall motion abnormalities. Left ventricular diastolic parameters are  consistent with Grade I diastolic  dysfunction (impaired relaxation). Elevated left ventricular end-diastolic  pressure.   2. Right ventricular systolic function is normal. The right ventricular  size is normal. Tricuspid regurgitation signal is inadequate for assessing  PA pressure.   3. The mitral valve is normal in structure. No evidence of mitral valve  regurgitation. No evidence of mitral stenosis.   4. The aortic valve is normal in structure. Aortic valve regurgitation is  not visualized. No aortic stenosis is present.   5. The inferior vena cava is normal in size with greater than 50%  respiratory variability, suggesting right atrial pressure of 3 mmHg.  EKG:  EKG is not ordered today.    Recent Labs: 12/31/2020: TSH 1.45 04/11/2021: Magnesium 2.1 07/07/2021: ALT 16 08/10/2021: B  Natriuretic Peptide 22.3; Hemoglobin 12.5; Platelets 257 09/09/2021: BUN 38; Creatinine, Ser 1.18; Potassium 3.6; Sodium 143  Recent Lipid Panel    Component Value Date/Time   CHOL 147 07/07/2021 1510   TRIG 140.0 07/07/2021 1510   TRIG 126 12/31/2006 0847   HDL 43.30 07/07/2021 1510   CHOLHDL 3 07/07/2021 1510   VLDL 28.0 07/07/2021 1510   LDLCALC 76 07/07/2021 1510   LDLDIRECT 127.0 01/25/2019 1423     Risk Assessment/Calculations:           Physical Exam:    VS:  BP 134/68   Pulse 70   Ht '5\' 5"'  (1.651 m)   Wt (!) 367 lb 9.6 oz (166.7 kg)   SpO2 97%   BMI 61.17 kg/m     Wt Readings from Last 3 Encounters:  09/09/21 (!) 367 lb 9.6 oz (166.7 kg)  08/18/21 (!) 366 lb (166 kg)  07/07/21 (!) 360 lb (163.3 kg)     GEN:  Well nourished, well developed in no acute distress HEENT: Normal NECK: No JVD; No carotid bruits LYMPHATICS: No lymphadenopathy CARDIAC: RRR, no murmurs, rubs, gallops RESPIRATORY:  Clear to auscultation without rales, wheezing or rhonchi  ABDOMEN: Soft, non-tender, non-distended MUSCULOSKELETAL:  No edema; No deformity  SKIN: Warm and dry NEUROLOGIC:  Alert and oriented x 3 PSYCHIATRIC:  Normal affect   ASSESSMENT:    1. Chronic diastolic heart failure ([place])   2. Medication management   3. Essential hypertension   4. Hyperlipidemia LDL goal <70   5. Controlled type 2 diabetes mellitus without complication, without long-term current use of insulin ([place])   6. Morbid obesity ([place])   7. Palpitations    PLAN:    In order of problems listed above:  Chronic diastolic heart failure:  She appears to be euvolemic on today's physical exam.  She was given a single dose of metolazone.  She is still on 60 mg twice a day of torsemide.  I recommended continue on the current therapy.  I will obtain a basic metabolic panel today to follow-up on the renal function.  I suspect her lower extremity edema is contributed by morbid obesity  Hypertension: Blood  pressure stable on current therapy  Hyperlipidemia: Intolerant of Crestor  DM2: Managed by primary care provider  Morbid obesity: Weight loss recommended.  Palpitation: Symptom may last from seconds to minutes at a time.  I will obtain basic metabolic panel to check for electrolyte.  If symptom still persist, may consider a heart monitor.         Medication Adjustments/Labs and Tests Ordered: Current medicines are reviewed at length with the patient today.  Concerns regarding medicines are outlined above.  Orders Placed This Encounter  Procedures   Basic metabolic panel   Meds ordered this encounter  Medications   losartan (COZAAR) 50 MG tablet    Sig: Take 1 tablet (50 mg total) by mouth daily.    Dispense:  90 tablet    Refill:  3    Patient Instructions  Medication Instructions:  DECREASE Losartan to 50 mg daily  *If you need a refill on your cardiac medications before your next appointment, please call your pharmacy*  Lab Work: Your physician recommends that you return for lab work TODAY:  BMET  If you have labs (blood work) drawn today and your tests are completely normal, you will receive your results only by: [place] (if you have MyChart) OR A paper copy in the mail If you have any lab test that is abnormal or we need to change your treatment, we will call you to review the results.  Testing/Procedures: NONE ordered at this time of appointment   Follow-Up: At [facility], you and your health needs are our priority.  As part of our continuing mission to provide you with exceptional heart care, we have created designated Provider Care Teams.  These Care Teams include your primary Cardiologist (physician) and Advanced Practice Providers (APPs -  Physician Assistants and Nurse Practitioners) who all work together to provide you with the care you need, when you need it.  Your next appointment:   As scheduled    The format for your next appointment:    In Person  Provider:   Minus Breeding, MD  Other Instructions    Signed, Almyra Deforest, [place]  09/11/2021 11:31 PM    [place]

## 2021-09-09 NOTE — Patient Instructions (Signed)
Medication Instructions:  DECREASE Losartan to 50 mg daily  *If you need a refill on your cardiac medications before your next appointment, please call your pharmacy*  Lab Work: Your physician recommends that you return for lab work TODAY:  BMET  If you have labs (blood work) drawn today and your tests are completely normal, you will receive your results only by: [place] (if you have MyChart) OR A paper copy in the mail If you have any lab test that is abnormal or we need to change your treatment, we will call you to review the results.  Testing/Procedures: NONE ordered at this time of appointment   Follow-Up: At [facility], you and your health needs are our priority.  As part of our continuing mission to provide you with exceptional heart care, we have created designated Provider Care Teams.  These Care Teams include your primary Cardiologist (physician) and Advanced Practice Providers (APPs -  Physician Assistants and Nurse Practitioners) who all work together to provide you with the care you need, when you need it.  Your next appointment:   As scheduled    The format for your next appointment:   In Person  Provider:   Minus Breeding, MD  Other Instructions

## 2021-09-10 ENCOUNTER — Telehealth: Payer: Self-pay | Admitting: Cardiology

## 2021-09-10 DIAGNOSIS — I5033 Acute on chronic diastolic (congestive) heart failure: Secondary | ICD-10-CM | POA: Diagnosis not present

## 2021-09-10 DIAGNOSIS — I872 Venous insufficiency (chronic) (peripheral): Secondary | ICD-10-CM | POA: Diagnosis not present

## 2021-09-10 DIAGNOSIS — I890 Lymphedema, not elsewhere classified: Secondary | ICD-10-CM | POA: Diagnosis not present

## 2021-09-10 DIAGNOSIS — E1151 Type 2 diabetes mellitus with diabetic peripheral angiopathy without gangrene: Secondary | ICD-10-CM | POA: Diagnosis not present

## 2021-09-10 DIAGNOSIS — I110 Hypertensive heart disease with heart failure: Secondary | ICD-10-CM | POA: Diagnosis not present

## 2021-09-10 LAB — BASIC METABOLIC PANEL
BUN/Creatinine Ratio: 32 — ABNORMAL HIGH (ref 12–28)
BUN: 38 mg/dL — ABNORMAL HIGH (ref 8–27)
CO2: 31 mmol/L — ABNORMAL HIGH (ref 20–29)
Calcium: 9.5 mg/dL (ref 8.7–10.3)
Chloride: 95 mmol/L — ABNORMAL LOW (ref 96–106)
Creatinine, Ser: 1.18 mg/dL — ABNORMAL HIGH (ref 0.57–1.00)
Glucose: 147 mg/dL — ABNORMAL HIGH (ref 65–99)
Potassium: 3.6 mmol/L (ref 3.5–5.2)
Sodium: 143 mmol/L (ref 134–144)
eGFR: 52 mL/min/{1.73_m2} — ABNORMAL LOW (ref >59)

## 2021-09-10 NOTE — Telephone Encounter (Signed)
New message:    Patient calling to get results she doesn't understand them on her mychart.

## 2021-09-10 NOTE — Telephone Encounter (Signed)
Called patient, advised that results were not reviewed yet- but once they were patient would like to be called with these, she is worried since she seen the numbers of her kidney functions.   I advised that we would call with those results once we had them.  Will route to PA that ordered blood work to review when able.   Thank you!

## 2021-09-11 ENCOUNTER — Other Ambulatory Visit: Payer: Self-pay

## 2021-09-11 ENCOUNTER — Encounter: Payer: Self-pay | Admitting: Physician Assistant

## 2021-09-11 ENCOUNTER — Telehealth: Payer: Self-pay | Admitting: Cardiology

## 2021-09-11 DIAGNOSIS — I5032 Chronic diastolic (congestive) heart failure: Secondary | ICD-10-CM

## 2021-09-11 NOTE — Telephone Encounter (Signed)
Spoke to patient she stated she has gained 5 lbs over night.Stated she has increased swelling in abdomen and lower legs.She saw Almyra Deforest PA recently.Spoke to [employer] PA he advised only take a extra Torsemide 20 mg daily if weight over 370 lbs.Continue Torsemide 60 mg twice a day.Bmet was stable.Repeat bmet in 2 weeks.

## 2021-09-11 NOTE — Telephone Encounter (Signed)
Pt c/o swelling: STAT is pt has developed SOB within 24 hours  If swelling, where is the swelling located? Stomach and legs feet  How much weight have you gained and in what time span? 5 pound over night  Have you gained 3 pounds in a day or 5 pounds in a week? Yes  Do you have a log of your daily weights (if so, list)? Yes   Are you currently taking a fluid pill? Yes   Are you currently SOB? No when sitting  Have you traveled recently? No

## 2021-09-15 ENCOUNTER — Telehealth: Payer: Self-pay

## 2021-09-15 DIAGNOSIS — N1831 Chronic kidney disease, stage 3a: Secondary | ICD-10-CM

## 2021-09-15 NOTE — Progress Notes (Signed)
Chronic Care Management Pharmacy Assistant   Name: Maria [name]  MRN: [id number] DOB: May 01, 1956   Reason for Encounter: Disease State General Assessment Call    Recent office visits:  None noted   Recent consult visits:  09/09/21 Almyra Deforest PA - Seen for Chronic diastolic heart failure - Labs ordered -  given a single dose of  - DECREASE Losartan to 50 mg daily - No follow up noted  08/18/21 Deberah Pelton NP - Cardiology - Seen for -  congestive heart failure with left ventricular diastolic dysfunction -  No medication changes noted - Follow up 11/20/21     Hospital visits:   Admitted to the hospital on 08/10/21 due to peripheral edema. Discharge date was 08/10/21. Discharged from [place]?Medications Started at [facility] Discharge:?? -started cephalexin 500 mg 4 times daily for 5 days   Medication Changes at Hospital Discharge: N/a  Medications Discontinued at Hospital Discharge: N/a  Medications that remain the same after Hospital Discharge:??  -All other medications will remain the same.     Admitted to the hospital on 05/01/21 due to Chronic congestive heart failure,. Discharge date was 05/01/21. Discharged from [place]?Medications Started at [facility] Discharge:?? N/a  Medication Changes at Hospital Discharge: N/a  Medications Discontinued at Hospital Discharge: N/a  Medications that remain the same after Hospital Discharge:??  -All other medications will remain the same.     Admitted to the hospital on 04/05/21 due to AKI. Discharge date was 04/11/21. Discharged from [place]?Medications Started at [facility] Discharge:?? -started magnesium oxide (MAG-OX) Start taking on: April 12, 2021 metoprolol tartrate (LOPRESSOR) polyethylene glycol powder (GLYCOLAX/MIRALAX) potassium chloride SA (KLOR-CON) torsemide (Demadex)  Medication Changes at Hospital  Discharge: N/a  Medications Discontinued at Hospital Discharge: -Stopped cephALEXin 500 MG capsule (KEFLEX) furosemide 20 MG tablet (LASIX) metolazone 2.5 MG tablet (ZAROXOLYN) potassium chloride 10 MEQ CR capsule (MICRO-K)  Medications that remain the same after Hospital Discharge:??  -All other medications will remain the same.     Admitted to the hospital on 04/01/21 due to Chronic venous stasis . Discharge date was 04/01/21. Discharged from [place]?Medications Started at [facility] Discharge:?? N/a  Medication Changes at Hospital Discharge: N/a  Medications Discontinued at Hospital Discharge: N/a  Medications that remain the same after Hospital Discharge:??  -All other medications will remain the same.    Medications: Outpatient Encounter Medications as of 09/15/2021  Medication Sig   Accu-Chek Softclix Lancets lancets Use as directed up to 4 times daily (Patient taking differently: 1 each by Other route in the morning, at noon, in the evening, and at bedtime.)   acetaminophen (TYLENOL) 325 MG tablet Take 650 mg by mouth every 6 (six) hours as needed for headache (pain).   albuterol (PROAIR HFA) 108 (90 Base) MCG/ACT inhaler Inhale 2 puffs into the lungs every 6 (six) hours as needed for wheezing or shortness of breath.   Blood Glucose Monitoring Suppl (ACCU-CHEK GUIDE) w/Device KIT Use as directed (Patient taking differently: 1 each by Other route as directed.)   Cholecalciferol (HM VITAMIN D3) 100 MCG (4000 UT) CAPS Take 4,000 Units by mouth daily.   clonazePAM (KLONOPIN) 1 MG tablet TAKE 1 TABLET BY MOUTH 2 TIMES A DAY AS NEEDED FOR ANXIETY (Patient taking differently: Take 1 mg by mouth 2 (two) times daily as needed for anxiety.)  cyclobenzaprine (FLEXERIL) 10 MG tablet TAKE 1 TABLET BY MOUTH 2 TIMES A DAY AS NEEDED FOR MUSCLE SPASMS (Patient taking differently: Take 10 mg by mouth 2 (two) times daily as needed for muscle spasms.)   Fexofenadine  HCl (ALLEGRA PO) Take 180 mg by mouth as needed.   glucose blood (ACCU-CHEK GUIDE) test strip Use as instructed up to 4 times daily (Patient taking differently: Use as instructed up to 4 times daily)   LINZESS 145 MCG CAPS capsule Take 145 mcg by mouth daily as needed (constipation). (Patient not taking: Reported on 09/09/2021)   loperamide (IMODIUM) 2 MG capsule Take 2 mg by mouth as needed for diarrhea or loose stools.   losartan (COZAAR) 50 MG tablet Take 1 tablet (50 mg total) by mouth daily.   metFORMIN (GLUCOPHAGE) 500 MG tablet Take 1 tablet (500 mg total) by mouth 2 (two) times daily with a meal.   metoprolol tartrate (LOPRESSOR) 25 MG tablet Take 1 tablet (25 mg total) by mouth 2 (two) times daily.   NARCAN 4 MG/0.1ML LIQD nasal spray kit Place 0.4 mg into the nose daily as needed (opioid overdose).   ondansetron (ZOFRAN ODT) 4 MG disintegrating tablet Take 1 tablet (4 mg total) by mouth every 4 (four) hours as needed for nausea or vomiting.   Oxycodone HCl 10 MG TABS Take 1 tablet (10 mg total) 4 (four) times daily by mouth. Per [facility] (Patient taking differently: Take 10 mg by mouth every 4 (four) hours as needed (pain). Per [facility])   pantoprazole (PROTONIX) 40 MG tablet TAKE ONE TABLET BY MOUTH DAILY (Patient taking differently: Take 40 mg by mouth daily.)   Polyethyl Glycol-Propyl Glycol 0.4-0.3 % SOLN Apply 1 drop to eye daily as needed.   polyethylene glycol powder (GLYCOLAX/MIRALAX) 17 GM/SCOOP powder Take 17 g by mouth daily as needed for mild constipation.   potassium chloride SA (KLOR-CON) 20 MEQ tablet Take 1 tablet (20 mEq total) by mouth 2 (two) times daily.   rOPINIRole (REQUIP) 1 MG tablet Take 1 tablet (1 mg total) by mouth 3 (three) times daily.   Simethicone (GAS-X EXTRA STRENGTH PO) Take 1 tablet by mouth daily as needed (gas).   torsemide (DEMADEX) 20 MG tablet Take 20 mg  3 tablets ( 60 mg ) twice a day.May take a extra 20 mg daily if weight  over 370 lbs.   [DISCONTINUED] DULoxetine (CYMBALTA) 60 MG capsule Take 60 mg by mouth daily.     No facility-administered encounter medications on file as of 09/15/2021.    Care Gaps: Zoster Vaccines- Shingrix Never done  COVID-19 Vaccine Last completed: Oct 19, 2020 INFLUENZA VACCINE Last completed: Dec 31, 2020 FOOT EXAM Never done   Have you had any problems recently with your health? Patient states that she has been having a rough time lately. She states that she has swelling in her legs, back pain, and hair falling out.  Have you had any problems with your pharmacy? Patient states no problems with pharmacy at this time.   What issues or side effects are you having with your medications? Patient states that she is not doing well on Torsemide. She states that she is not using the bathroom as often as she would like, about 6-8 times a day.   What would you like me to pass along to [facility] for them to help you with?  Patient states that she is not felling well. She states that she has some swelling  in her legs and that at times, she wakes up with pounds of fluid weight. Patient states that she is currently trying to get a referral to a kidney specialist, as she feels as though her symptoms are related to her kidneys. She states that she has had kidney infections frequently since the age of 7. When asked about how she manages pain, she states that she uses ice packs and Oxycodone. She also states that she is constantly fatigue and is unsure of how to combat it. Patient states that she is awaiting a call from her PCP at the moment on her referral. I advised her to call my number on 09/16/21 if she does not hear from her PCP and I will then contact CPP Dan for further advise.    Star Rating Drugs: Losartan 50 mg last fill 09/09/21 90 DS  Metformin 500 mg last fill 08/26/21 0 DS    Andee Poles, CMA

## 2021-09-15 NOTE — Telephone Encounter (Signed)
Ok this can be done

## 2021-09-15 NOTE — Telephone Encounter (Signed)
Please advise as the pt is asking for a referral to see a kidney specialist as she is concerned about her kidney function a/w recent labs that came back abnormal. Pt states she is taking three 20mg  tabs of torsemide (DEMADEX) 20 MG tablet and is not able to urinate still.   **Pt states her skin is very dry. Pt states she has a lot of swelling in her legs and her knees are hurting as well. Pt denies trouble breathing, BP is good, O2 is good and HR is good.  Please contact pt at [phone].

## 2021-09-16 ENCOUNTER — Telehealth: Payer: Self-pay | Admitting: Internal Medicine

## 2021-09-16 ENCOUNTER — Telehealth: Payer: Self-pay | Admitting: Cardiology

## 2021-09-16 DIAGNOSIS — I872 Venous insufficiency (chronic) (peripheral): Secondary | ICD-10-CM | POA: Diagnosis not present

## 2021-09-16 DIAGNOSIS — R300 Dysuria: Secondary | ICD-10-CM

## 2021-09-16 DIAGNOSIS — I890 Lymphedema, not elsewhere classified: Secondary | ICD-10-CM | POA: Diagnosis not present

## 2021-09-16 DIAGNOSIS — I5033 Acute on chronic diastolic (congestive) heart failure: Secondary | ICD-10-CM | POA: Diagnosis not present

## 2021-09-16 DIAGNOSIS — I110 Hypertensive heart disease with heart failure: Secondary | ICD-10-CM | POA: Diagnosis not present

## 2021-09-16 DIAGNOSIS — E1151 Type 2 diabetes mellitus with diabetic peripheral angiopathy without gangrene: Secondary | ICD-10-CM | POA: Diagnosis not present

## 2021-09-16 NOTE — Telephone Encounter (Signed)
Ok for UA and culture at [facility] lab at her convenience

## 2021-09-16 NOTE — Telephone Encounter (Signed)
[place] Name: [facility] Agency Name: Shelly Bombard Phone #: [phone] Service Requested: UA and CNS order (examples: OT/PT/Skilled Nursing/Social Work/Speech Therapy/Wound Care) Frequency of Visits: n/a  Patient is having burning and discomfort when urinating. They also reached out the cardiologist due to weight gain.

## 2021-09-16 NOTE — Telephone Encounter (Signed)
See below

## 2021-09-16 NOTE — Telephone Encounter (Signed)
Spoke to [place] PT with [facility] she saw patient today.Patient has had a 12 lb weight gain in 1 week. She weighed 377 lbs.Lungs clear. No sob.She is having burning upon urination.Pain in right lower back.Case manager will be calling PCP for a urine culture order.Advised she was told she can take a extra 20 mg Torsemide if weight greater than 370 lbs. Spoke to patient she took a extra Torsemide 20 mg this morning.Sooner appointment offered with Dr.Hochrein.Stated she will keep appointment already scheduled and call sooner if needed.She will have PCP do a urinalysis and urine culture.

## 2021-09-16 NOTE — Telephone Encounter (Signed)
Pt c/o swelling: STAT is pt has developed SOB within 24 hours  If swelling, where is the swelling located? Stomach and legs  How much weight have you gained and in what time span? 12 pounds in one week  Have you gained 3 pounds in a day or 5 pounds in a week? yes  Do you have a log of your daily weights (if so, list)? 9/21 - 365 , 09/22 - 370, 9/23 - 370, 9/24 - 370, 9/25 - 373, 9/26 - 375, 9/27 - 377  Are you currently taking a fluid pill? yes  Are you currently SOB? No   Have you traveled recently? No    reporting a 12 pound weight gain in a week,skin is dry, fatigue, foamy urine, leg cramps, lungs clear, also swelling in her stomach, urinated 3-4 times, burning with urinating and complaining of low back pain on the right side thinking possibly kidney issues, would like to know if Dr. Ebbie Latus like to increase lasix, patient care manager is also requesting a urine sample from pcp to rule out kidney issues, no sob reported. Taking medication as prescribed and sticking to fluid restrictions. Also reporting dark urine off and on.

## 2021-09-17 ENCOUNTER — Encounter: Payer: Self-pay | Admitting: Internal Medicine

## 2021-09-17 ENCOUNTER — Telehealth: Payer: Self-pay

## 2021-09-17 DIAGNOSIS — I872 Venous insufficiency (chronic) (peripheral): Secondary | ICD-10-CM | POA: Diagnosis not present

## 2021-09-17 DIAGNOSIS — N390 Urinary tract infection, site not specified: Secondary | ICD-10-CM | POA: Diagnosis not present

## 2021-09-17 DIAGNOSIS — I110 Hypertensive heart disease with heart failure: Secondary | ICD-10-CM | POA: Diagnosis not present

## 2021-09-17 DIAGNOSIS — I890 Lymphedema, not elsewhere classified: Secondary | ICD-10-CM | POA: Diagnosis not present

## 2021-09-17 DIAGNOSIS — E1151 Type 2 diabetes mellitus with diabetic peripheral angiopathy without gangrene: Secondary | ICD-10-CM | POA: Diagnosis not present

## 2021-09-17 DIAGNOSIS — I5033 Acute on chronic diastolic (congestive) heart failure: Secondary | ICD-10-CM | POA: Diagnosis not present

## 2021-09-17 NOTE — Telephone Encounter (Signed)
Patient notified and already sent in specimen

## 2021-09-17 NOTE — Telephone Encounter (Signed)
Verbal orders given for UA

## 2021-09-18 ENCOUNTER — Telehealth: Payer: Self-pay | Admitting: Cardiology

## 2021-09-18 ENCOUNTER — Telehealth: Payer: Self-pay | Admitting: Internal Medicine

## 2021-09-18 NOTE — Telephone Encounter (Signed)
   Scheduler spoke with patient offered same day virtual visit, offered next day with Dr [name] Reichmann. Patient declined appointments stating she is going to urgent care.

## 2021-09-18 NOTE — Telephone Encounter (Signed)
Returned call to [place] she states that she "sent over a nurse" to check on pt. She states that pt has scale that transmits to them daily and pt's weight is up 10 # in a week. Pt is taking Torsemide 60mg -AM and 60mg -PM. Per pt she does not think that this her CHF as she is having frequent urination, burning upon urination and very little urine output. [place],[facility] states that Dr Jeneen Rinks gave pt antibiotic for UTI and tried to get pt in for appointment tomorrow pt declined. Per message in EPIC:   [place] [facility]  at 11:33 AM Note: Scheduler spoke with patient offered same day virtual visit, offered next day with Dr [name] Reichmann.  Patient declined appointments stating she is going to urgent care.  Please advise.

## 2021-09-18 NOTE — Telephone Encounter (Signed)
Pt c/o swelling: STAT is pt has developed SOB within 24 hours  If swelling, where is the swelling located? Abdomen and legs  How much weight have you gained and in what time span? 10 lbs in 1 week  Have you gained 3 pounds in a day or 5 pounds in a week?   Do you have a log of your daily weights (if so, list)? They were faxed to the office   Are you currently taking a fluid pill? Yes   Are you currently SOB? no  Have you traveled recently?   Vicente Males , [name] Nurse faxed list of weights to Dr. Percival Spanish

## 2021-09-18 NOTE — Telephone Encounter (Signed)
Stated the patient had called reporting fatigue, edema below the knees, SOB and weight gain.Per caller the patient sounded very weak and could tell she wasn't feeling well. Advised by the nurse to contact PCP to schedule telehealth visit to further asset her. The nurse faxed over last UA labs, placed in provider's box.   Called and left a voicemail to schedule appointment

## 2021-09-19 ENCOUNTER — Encounter (HOSPITAL_COMMUNITY): Payer: Self-pay | Admitting: Emergency Medicine

## 2021-09-19 ENCOUNTER — Emergency Department (HOSPITAL_COMMUNITY): Payer: Medicare Other

## 2021-09-19 ENCOUNTER — Encounter: Payer: Self-pay | Admitting: Nurse Practitioner

## 2021-09-19 ENCOUNTER — Inpatient Hospital Stay (HOSPITAL_COMMUNITY)
Admission: EM | Admit: 2021-09-19 | Discharge: 2021-09-25 | DRG: 291 | Disposition: A | Discharge status: Home Health | Payer: Medicare Other | Attending: Family Medicine | Admitting: Family Medicine

## 2021-09-19 ENCOUNTER — Telehealth (INDEPENDENT_AMBULATORY_CARE_PROVIDER_SITE_OTHER): Payer: Medicare Other | Admitting: Nurse Practitioner

## 2021-09-19 ENCOUNTER — Other Ambulatory Visit: Payer: Self-pay

## 2021-09-19 VITALS — BP 148/67 | HR 67 | Ht 65.0 in | Wt 383.0 lb

## 2021-09-19 DIAGNOSIS — E1169 Type 2 diabetes mellitus with other specified complication: Secondary | ICD-10-CM | POA: Diagnosis present

## 2021-09-19 DIAGNOSIS — I700 Atherosclerosis of aorta: Secondary | ICD-10-CM | POA: Diagnosis present

## 2021-09-19 DIAGNOSIS — I130 Hypertensive heart and chronic kidney disease with heart failure and stage 1 through stage 4 chronic kidney disease, or unspecified chronic kidney disease: Principal | ICD-10-CM | POA: Diagnosis present

## 2021-09-19 DIAGNOSIS — I5032 Chronic diastolic (congestive) heart failure: Secondary | ICD-10-CM

## 2021-09-19 DIAGNOSIS — Z8614 Personal history of Methicillin resistant Staphylococcus aureus infection: Secondary | ICD-10-CM | POA: Diagnosis not present

## 2021-09-19 DIAGNOSIS — N1831 Chronic kidney disease, stage 3a: Secondary | ICD-10-CM | POA: Diagnosis present

## 2021-09-19 DIAGNOSIS — D631 Anemia in chronic kidney disease: Secondary | ICD-10-CM | POA: Diagnosis present

## 2021-09-19 DIAGNOSIS — Z833 Family history of diabetes mellitus: Secondary | ICD-10-CM | POA: Diagnosis not present

## 2021-09-19 DIAGNOSIS — Z743 Need for continuous supervision: Secondary | ICD-10-CM | POA: Diagnosis not present

## 2021-09-19 DIAGNOSIS — Z800 Family history of malignant neoplasm of digestive organs: Secondary | ICD-10-CM | POA: Diagnosis not present

## 2021-09-19 DIAGNOSIS — I5031 Acute diastolic (congestive) heart failure: Secondary | ICD-10-CM | POA: Diagnosis not present

## 2021-09-19 DIAGNOSIS — E870 Hyperosmolality and hypernatremia: Secondary | ICD-10-CM | POA: Diagnosis present

## 2021-09-19 DIAGNOSIS — I509 Heart failure, unspecified: Secondary | ICD-10-CM

## 2021-09-19 DIAGNOSIS — Z8249 Family history of ischemic heart disease and other diseases of the circulatory system: Secondary | ICD-10-CM

## 2021-09-19 DIAGNOSIS — Z6841 Body Mass Index (BMI) 40.0 and over, adult: Secondary | ICD-10-CM | POA: Diagnosis not present

## 2021-09-19 DIAGNOSIS — I5033 Acute on chronic diastolic (congestive) heart failure: Secondary | ICD-10-CM | POA: Diagnosis present

## 2021-09-19 DIAGNOSIS — E1151 Type 2 diabetes mellitus with diabetic peripheral angiopathy without gangrene: Secondary | ICD-10-CM | POA: Diagnosis not present

## 2021-09-19 DIAGNOSIS — R1900 Intra-abdominal and pelvic swelling, mass and lump, unspecified site: Secondary | ICD-10-CM | POA: Diagnosis not present

## 2021-09-19 DIAGNOSIS — E1122 Type 2 diabetes mellitus with diabetic chronic kidney disease: Secondary | ICD-10-CM | POA: Diagnosis present

## 2021-09-19 DIAGNOSIS — Z888 Allergy status to other drugs, medicaments and biological substances status: Secondary | ICD-10-CM

## 2021-09-19 DIAGNOSIS — Z20822 Contact with and (suspected) exposure to covid-19: Secondary | ICD-10-CM | POA: Diagnosis present

## 2021-09-19 DIAGNOSIS — N189 Chronic kidney disease, unspecified: Secondary | ICD-10-CM | POA: Diagnosis present

## 2021-09-19 DIAGNOSIS — I878 Other specified disorders of veins: Secondary | ICD-10-CM | POA: Diagnosis present

## 2021-09-19 DIAGNOSIS — K219 Gastro-esophageal reflux disease without esophagitis: Secondary | ICD-10-CM | POA: Diagnosis not present

## 2021-09-19 DIAGNOSIS — Z87891 Personal history of nicotine dependence: Secondary | ICD-10-CM

## 2021-09-19 DIAGNOSIS — E119 Type 2 diabetes mellitus without complications: Secondary | ICD-10-CM | POA: Diagnosis present

## 2021-09-19 DIAGNOSIS — L8990 Pressure ulcer of unspecified site, unspecified stage: Secondary | ICD-10-CM | POA: Insufficient documentation

## 2021-09-19 DIAGNOSIS — G894 Chronic pain syndrome: Secondary | ICD-10-CM | POA: Diagnosis not present

## 2021-09-19 DIAGNOSIS — I872 Venous insufficiency (chronic) (peripheral): Secondary | ICD-10-CM | POA: Diagnosis not present

## 2021-09-19 DIAGNOSIS — E785 Hyperlipidemia, unspecified: Secondary | ICD-10-CM | POA: Diagnosis not present

## 2021-09-19 DIAGNOSIS — Z91040 Latex allergy status: Secondary | ICD-10-CM

## 2021-09-19 DIAGNOSIS — R0602 Shortness of breath: Secondary | ICD-10-CM | POA: Diagnosis not present

## 2021-09-19 DIAGNOSIS — Z818 Family history of other mental and behavioral disorders: Secondary | ICD-10-CM | POA: Diagnosis not present

## 2021-09-19 DIAGNOSIS — L89152 Pressure ulcer of sacral region, stage 2: Secondary | ICD-10-CM | POA: Diagnosis present

## 2021-09-19 DIAGNOSIS — N183 Chronic kidney disease, stage 3 unspecified: Secondary | ICD-10-CM | POA: Diagnosis present

## 2021-09-19 DIAGNOSIS — F419 Anxiety disorder, unspecified: Secondary | ICD-10-CM | POA: Diagnosis present

## 2021-09-19 DIAGNOSIS — Z825 Family history of asthma and other chronic lower respiratory diseases: Secondary | ICD-10-CM

## 2021-09-19 DIAGNOSIS — R609 Edema, unspecified: Secondary | ICD-10-CM | POA: Diagnosis not present

## 2021-09-19 DIAGNOSIS — R269 Unspecified abnormalities of gait and mobility: Secondary | ICD-10-CM | POA: Diagnosis present

## 2021-09-19 DIAGNOSIS — Z79899 Other long term (current) drug therapy: Secondary | ICD-10-CM

## 2021-09-19 DIAGNOSIS — Z885 Allergy status to narcotic agent status: Secondary | ICD-10-CM

## 2021-09-19 DIAGNOSIS — L03119 Cellulitis of unspecified part of limb: Secondary | ICD-10-CM | POA: Diagnosis present

## 2021-09-19 DIAGNOSIS — I10 Essential (primary) hypertension: Secondary | ICD-10-CM | POA: Diagnosis present

## 2021-09-19 DIAGNOSIS — R0902 Hypoxemia: Secondary | ICD-10-CM | POA: Diagnosis not present

## 2021-09-19 DIAGNOSIS — R6889 Other general symptoms and signs: Secondary | ICD-10-CM | POA: Diagnosis not present

## 2021-09-19 DIAGNOSIS — L0390 Cellulitis, unspecified: Secondary | ICD-10-CM | POA: Diagnosis present

## 2021-09-19 DIAGNOSIS — R5381 Other malaise: Secondary | ICD-10-CM | POA: Diagnosis present

## 2021-09-19 DIAGNOSIS — E739 Lactose intolerance, unspecified: Secondary | ICD-10-CM | POA: Diagnosis present

## 2021-09-19 DIAGNOSIS — Z90710 Acquired absence of both cervix and uterus: Secondary | ICD-10-CM

## 2021-09-19 LAB — COMPREHENSIVE METABOLIC PANEL
ALT: 15 U/L (ref 0–44)
AST: 19 U/L (ref 15–41)
Albumin: 3.8 g/dL (ref 3.5–5.0)
Alkaline Phosphatase: 69 U/L (ref 38–126)
Anion gap: 10 (ref 5–15)
BUN: 15 mg/dL (ref 8–23)
CO2: 32 mmol/L (ref 22–32)
Calcium: 8.8 mg/dL — ABNORMAL LOW (ref 8.9–10.3)
Chloride: 99 mmol/L (ref 98–111)
Creatinine, Ser: 1.07 mg/dL — ABNORMAL HIGH (ref 0.44–1.00)
GFR, Estimated: 58 mL/min — ABNORMAL LOW (ref >60)
Glucose, Bld: 130 mg/dL — ABNORMAL HIGH (ref 70–99)
Potassium: 3.6 mmol/L (ref 3.5–5.1)
Sodium: 141 mmol/L (ref 135–145)
Total Bilirubin: 1.1 mg/dL (ref 0.3–1.2)
Total Protein: 7.3 g/dL (ref 6.5–8.1)

## 2021-09-19 LAB — CBC WITH DIFFERENTIAL/PLATELET
Abs Immature Granulocytes: 0.02 10*3/uL (ref 0.00–0.07)
Basophils Absolute: 0 10*3/uL (ref 0.0–0.1)
Basophils Relative: 1 %
Eosinophils Absolute: 0.2 10*3/uL (ref 0.0–0.5)
Eosinophils Relative: 2 %
HCT: 36.1 % (ref 36.0–46.0)
Hemoglobin: 11.6 g/dL — ABNORMAL LOW (ref 12.0–15.0)
Immature Granulocytes: 0 %
Lymphocytes Relative: 32 %
Lymphs Abs: 2.6 10*3/uL (ref 0.7–4.0)
MCH: 30.2 pg (ref 26.0–34.0)
MCHC: 32.1 g/dL (ref 30.0–36.0)
MCV: 94 fL (ref 80.0–100.0)
Monocytes Absolute: 0.6 10*3/uL (ref 0.1–1.0)
Monocytes Relative: 7 %
Neutro Abs: 4.6 10*3/uL (ref 1.7–7.7)
Neutrophils Relative %: 58 %
Platelets: 250 10*3/uL (ref 150–400)
RBC: 3.84 MIL/uL — ABNORMAL LOW (ref 3.87–5.11)
RDW: 12.7 % (ref 11.5–15.5)
WBC: 8 10*3/uL (ref 4.0–10.5)
nRBC: 0 % (ref 0.0–0.2)

## 2021-09-19 LAB — URINALYSIS, ROUTINE W REFLEX MICROSCOPIC
Bilirubin Urine: NEGATIVE
Glucose, UA: NEGATIVE mg/dL
Ketones, ur: NEGATIVE mg/dL
Nitrite: NEGATIVE
Protein, ur: NEGATIVE mg/dL
Specific Gravity, Urine: 1.005 (ref 1.005–1.030)
pH: 7 (ref 5.0–8.0)

## 2021-09-19 LAB — RESP PANEL BY RT-PCR (FLU A&B, COVID) ARPGX2
Influenza A by PCR: NEGATIVE
Influenza B by PCR: NEGATIVE
SARS Coronavirus 2 by RT PCR: NEGATIVE

## 2021-09-19 LAB — CBG MONITORING, ED: Glucose-Capillary: 126 mg/dL — ABNORMAL HIGH (ref 70–99)

## 2021-09-19 LAB — TROPONIN I (HIGH SENSITIVITY)
Troponin I (High Sensitivity): 4 ng/L (ref <18)
Troponin I (High Sensitivity): 5 ng/L (ref <18)

## 2021-09-19 LAB — GLUCOSE, CAPILLARY: Glucose-Capillary: 101 mg/dL — ABNORMAL HIGH (ref 70–99)

## 2021-09-19 LAB — BRAIN NATRIURETIC PEPTIDE: B Natriuretic Peptide: 34.1 pg/mL (ref 0.0–100.0)

## 2021-09-19 MED ORDER — POLYVINYL ALCOHOL 1.4 % OP SOLN
1.0000 [drp] | Freq: Every day | OPHTHALMIC | Status: DC | PRN
  Filled 2021-09-19: qty 15

## 2021-09-19 MED ORDER — ACETAMINOPHEN 325 MG PO TABS
650.0000 mg | ORAL_TABLET | Freq: Four times a day (QID) | ORAL | Status: DC | PRN
  Administered 2021-09-19 – 2021-09-20 (×2): 650 mg via ORAL
  Filled 2021-09-19 (×2): qty 2

## 2021-09-19 MED ORDER — ONDANSETRON HCL 4 MG PO TABS: 4.0000 mg | ORAL_TABLET | Freq: Four times a day (QID) | ORAL | Status: DC | PRN

## 2021-09-19 MED ORDER — PANTOPRAZOLE SODIUM 40 MG PO TBEC
40.0000 mg | DELAYED_RELEASE_TABLET | Freq: Every day | ORAL | Status: DC
  Administered 2021-09-20 – 2021-09-25 (×6): 40 mg via ORAL
  Filled 2021-09-19 (×6): qty 1

## 2021-09-19 MED ORDER — FUROSEMIDE 10 MG/ML IJ SOLN
80.0000 mg | Freq: Once | INTRAMUSCULAR | Status: AC
  Administered 2021-09-19: 80 mg via INTRAVENOUS
  Filled 2021-09-19: qty 8

## 2021-09-19 MED ORDER — ALBUTEROL SULFATE (2.5 MG/3ML) 0.083% IN NEBU: 2.5000 mg | INHALATION_SOLUTION | Freq: Four times a day (QID) | RESPIRATORY_TRACT | Status: DC | PRN

## 2021-09-19 MED ORDER — ONDANSETRON HCL 4 MG/2ML IJ SOLN
4.0000 mg | Freq: Four times a day (QID) | INTRAMUSCULAR | Status: DC | PRN
  Administered 2021-09-24: 4 mg via INTRAVENOUS
  Filled 2021-09-19: qty 2

## 2021-09-19 MED ORDER — INSULIN ASPART 100 UNIT/ML IJ SOLN
0.0000 [IU] | Freq: Three times a day (TID) | INTRAMUSCULAR | Status: DC
  Administered 2021-09-20 – 2021-09-24 (×4): 2 [IU] via SUBCUTANEOUS
  Filled 2021-09-19: qty 0.15

## 2021-09-19 MED ORDER — CYCLOBENZAPRINE HCL 10 MG PO TABS
10.0000 mg | ORAL_TABLET | Freq: Two times a day (BID) | ORAL | Status: DC | PRN
  Administered 2021-09-24: 10 mg via ORAL
  Filled 2021-09-19 (×2): qty 1

## 2021-09-19 MED ORDER — CLONAZEPAM 1 MG PO TABS
1.0000 mg | ORAL_TABLET | Freq: Two times a day (BID) | ORAL | Status: DC | PRN
  Administered 2021-09-21 – 2021-09-22 (×2): 1 mg via ORAL
  Filled 2021-09-19 (×2): qty 1

## 2021-09-19 MED ORDER — ROPINIROLE HCL 1 MG PO TABS
1.0000 mg | ORAL_TABLET | Freq: Three times a day (TID) | ORAL | Status: DC
  Administered 2021-09-19 – 2021-09-25 (×17): 1 mg via ORAL
  Filled 2021-09-19 (×17): qty 1

## 2021-09-19 MED ORDER — ALBUTEROL SULFATE HFA 108 (90 BASE) MCG/ACT IN AERS: 2.0000 | INHALATION_SPRAY | Freq: Four times a day (QID) | RESPIRATORY_TRACT | Status: DC | PRN

## 2021-09-19 MED ORDER — ENOXAPARIN SODIUM 100 MG/ML IJ SOSY
90.0000 mg | PREFILLED_SYRINGE | Freq: Every day | INTRAMUSCULAR | Status: DC
  Administered 2021-09-19 – 2021-09-22 (×4): 90 mg via SUBCUTANEOUS
  Filled 2021-09-19 (×4): qty 1

## 2021-09-19 MED ORDER — FENTANYL CITRATE PF 50 MCG/ML IJ SOSY
50.0000 ug | PREFILLED_SYRINGE | Freq: Once | INTRAMUSCULAR | Status: AC
  Administered 2021-09-19: 50 ug via INTRAVENOUS
  Filled 2021-09-19: qty 1

## 2021-09-19 MED ORDER — POTASSIUM CHLORIDE CRYS ER 20 MEQ PO TBCR
20.0000 meq | EXTENDED_RELEASE_TABLET | Freq: Two times a day (BID) | ORAL | Status: DC
  Administered 2021-09-19 – 2021-09-25 (×12): 20 meq via ORAL
  Filled 2021-09-19 (×12): qty 1

## 2021-09-19 MED ORDER — FUROSEMIDE 10 MG/ML IJ SOLN
60.0000 mg | Freq: Two times a day (BID) | INTRAMUSCULAR | Status: DC
  Administered 2021-09-20: 60 mg via INTRAVENOUS
  Filled 2021-09-19: qty 6

## 2021-09-19 MED ORDER — METOPROLOL TARTRATE 25 MG PO TABS
25.0000 mg | ORAL_TABLET | Freq: Two times a day (BID) | ORAL | Status: DC
  Administered 2021-09-19 – 2021-09-22 (×6): 25 mg via ORAL
  Filled 2021-09-19 (×6): qty 1

## 2021-09-19 MED ORDER — LOSARTAN POTASSIUM 50 MG PO TABS
50.0000 mg | ORAL_TABLET | Freq: Every day | ORAL | Status: DC
  Administered 2021-09-20 – 2021-09-22 (×3): 50 mg via ORAL
  Filled 2021-09-19 (×3): qty 1

## 2021-09-19 NOTE — ED Triage Notes (Signed)
Patient presents with SOB with exertion for the last couple days. She has 18 lbs in the past 10 days, was febrile and had diminished lung sounds. EMS administered 1000 of Tylenol.    HX: CHF   EMS vitals: 97% SPO2 RA 138/57 BP 68 HR 20-24 RR

## 2021-09-19 NOTE — Plan of Care (Signed)

## 2021-09-19 NOTE — Progress Notes (Signed)
Due to national recommendations of social distancing related to the [place] pandemic, an audio-only tele-health visit was felt to be the most appropriate encounter type for this patient today. I connected with  Adyson Vanburen Prisco on 09/19/21 utilizing audio-only technology and verified that I am speaking with the correct person using two identifiers. The patient was located at their home, and I was located at the office of [place] at [facility] during the encounter. I discussed the limitations of evaluation and management by telemedicine. The patient expressed understanding and agreed to proceed.     Subjective:  Patient ID: Maria [name], female    DOB: [birth year]  Age: 65 y.o. MRN: [id number]  CC:  Chief Complaint  Patient presents with   Leg Swelling       HPI  This patient arrives today for a virtual visit for the above.  She has been experiencing worsening leg swelling for approximately 10 days.  He tells me that it is very painful and she has gained about 18 pounds over the last 10 days.  She does have a history of heart failure but believes that her swelling and weight gain is not related to her heart but is concerns actually related to her kidneys.  I do see that her primary care provider has referred her to a kidney specialist but she has not yet been scheduled to see 1.  She tells me that her right leg is also a little bit red in addition to swelling and is quite painful.  She denies any respiratory symptoms such as shortness of breath, orthopnea, paroxysmal nocturnal dyspnea, or worsening fatigue.  She does tell me that with exertion she feels a bit short of breath but that is not abnormal for her.  She is currently on torsemide 60 mg twice a day.  She does have a cardiologist.  Past Medical History:  Diagnosis Date   Acute lymphadenitis 2011   ALLERGIC RHINITIS 08/10/2007   ANXIETY 12/06/2007   Atherosclerotic peripheral vascular disease ([place]) 06/13/2013   Aorta  on CT June 2014   Cellulitis 2011   Cervical disc disease 03/09/2012   CHF (congestive heart failure) ([place])    Chronic pain 03/09/2012   DEPRESSION 12/06/2007   Diabetes ([place])    DIVERTICULOSIS, [place] 12/06/2007   GERD 12/06/2007   Hepatitis    age 54 hepatitis A   HLD (hyperlipidemia) 05/24/2019   HYPERTENSION 12/06/2007   Lumbar disc disease 03/09/2012   Mesenteric adenitis    MRSA 2006   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    Social History   Social History Narrative   Patient has difficulty financially affording food, medication, and affording essential bills. She states she has limited support from her daughter.    Social History   Tobacco Use   Smoking status: Former  Years: 30.00    Types: Cigarettes    Quit date: 11/08/2008    Years since quitting: 12.8   Smokeless tobacco: Never   Tobacco comments:    quit 10/09  Substance Use Topics   Alcohol use: No    Comment: quit drinking 10/07/1997     Current Meds  Medication Sig   acetaminophen (TYLENOL) 325 MG tablet Take 650 mg by mouth every 6 (six) hours as needed for headache (pain).   albuterol (PROAIR HFA) 108 (90 Base) MCG/ACT inhaler Inhale 2 puffs into the lungs every 6 (six) hours as needed for wheezing or shortness of breath.   Cholecalciferol (HM VITAMIN D3) 100 MCG (4000 UT) CAPS Take 4,000 Units by mouth daily.   clonazePAM (KLONOPIN) 1 MG tablet TAKE 1 TABLET BY MOUTH 2 TIMES A DAY AS NEEDED FOR ANXIETY (Patient taking differently: Take 1  mg by mouth 2 (two) times daily as needed for anxiety.)   cyclobenzaprine (FLEXERIL) 10 MG tablet TAKE 1 TABLET BY MOUTH 2 TIMES A DAY AS NEEDED FOR MUSCLE SPASMS (Patient taking differently: Take 10 mg by mouth 2 (two) times daily as needed for muscle spasms.)   Fexofenadine HCl (ALLEGRA PO) Take 180 mg by mouth as needed.   loperamide (IMODIUM) 2 MG capsule Take 2 mg by mouth as needed for diarrhea or loose stools.   losartan (COZAAR) 50 MG tablet Take 1 tablet (50 mg total) by mouth daily.   metFORMIN (GLUCOPHAGE) 500 MG tablet Take 1 tablet (500 mg total) by mouth 2 (two) times daily with a meal.   NARCAN 4 MG/0.1ML LIQD nasal spray kit Place 0.4 mg into the nose daily as needed (opioid overdose).   ondansetron (ZOFRAN ODT) 4 MG disintegrating tablet Take 1 tablet (4 mg total) by mouth every 4 (four) hours as needed for nausea or vomiting.   Oxycodone HCl 10 MG TABS Take 1 tablet (10 mg total) 4 (four) times daily by mouth. Per [facility] (Patient taking differently: Take 10 mg by mouth every 4 (four) hours as needed (pain). Per [facility])   pantoprazole (PROTONIX) 40 MG tablet TAKE ONE TABLET BY MOUTH DAILY (Patient taking differently: Take 40 mg by mouth daily.)   Polyethyl Glycol-Propyl Glycol 0.4-0.3 % SOLN Apply 1 drop to eye daily as needed.   polyethylene glycol powder (GLYCOLAX/MIRALAX) 17 GM/SCOOP powder Take 17 g by mouth daily as needed for mild constipation.   rOPINIRole (REQUIP) 1 MG tablet Take 1 tablet (1 mg total) by mouth 3 (three) times daily.   Simethicone (GAS-X EXTRA STRENGTH PO) Take 1 tablet by mouth daily as needed (gas).   torsemide (DEMADEX) 20 MG tablet Take 20 mg  4 tablets ( 80 mg ) twice a day.May take a extra 20 mg daily if weight over 370 lbs.    ROS:  Review of Systems  Respiratory:  Positive for shortness of breath (with exertion). Negative for cough.   Cardiovascular:  Positive for leg swelling. Negative for chest pain, orthopnea and  PND.    Objective:   Today's Vitals: BP (!) 148/67   Pulse 67   Ht _0  (1.651 m)   Wt (!) 383 lb (173.7 kg)   BMI 63.73 kg/m  Vitals with BMI 09/19/2021 09/09/2021 08/18/2021  Height _1  _2  -  Weight 383 lbs 367 lbs 10 oz 366 lbs  BMI 92.44 62.86 -  Systolic 381 771 165  Diastolic 67 68 72  Pulse 67 70 62  Physical Exam Comprehensive physical exam not completed today as office visit was conducted remotely.  Patient sounded fairly well over the phone, she was able to speak in complete sentences.  We could not get the video to work so audio only technology was used.  She was emotional because she is concerned about her symptoms and is in quite a bit of pain..  Patient was alert and oriented, and appeared to have appropriate judgment.       Assessment and Plan   1. Chronic diastolic congestive heart failure ([place])      Plan: 1.  I did discuss the above case with my supervising physician (Dr. Quay Burow), recommendations are to increase torsemide to 80 mg twice a day and have patient follow-up in the office early next week.  I will schedule a telephone visit with her primary care provider on Monday as she is unable to get transportation any sooner than next Thursday.  We also recommended patient go to the emergency department as we are very limited on what we can complete with this virtual visit.  The patient was hesitant at first, but by the end of the visit decided that she probably does need to go to the emergency department and tells me she will call 911 for transportation.   Tests ordered No orders of the defined types were placed in this encounter.     No orders of the defined types were placed in this encounter.   Patient to follow-up next week for virtual visit on Monday and then again in person on Thursday for close follow-up. Total time on telephone was 40:51.  Ailene Ards, NP

## 2021-09-19 NOTE — H&P (Signed)
History and Physical    Maria [name] KDT:[id number] DOB: 30-Jul-1956 DOA: 09/19/2021  PCP: Biagio Borg, MD  Patient coming from: Home  Chief Complaint: swelling  HPI: Maria [name] is a 65 y.o. female with medical history significant of DM2, mobid obesity, dHF, HTN . Presenting w/ BLE edema and orthopnea. She reports that her symptoms began about a month ago. She has gained 18lbs during that time. She has noticed that it's hard to breathe when she is doing activity and when she is lying flat. She spoke with her PCP and cardiologist. There have been adjustments to her home diuretics, but they do not seem to be helping. She continued to be symptomatic this morning, so she came to the ED for help. She denies any other aggravating or alleviating factors.    ED Course: She was noted to be edematous. CXR was clear, BNP was 34, Trp negative. She was volume overloaded. She was given 25m IV lasix. TRH was called for admission.   Review of Systems:  Denies CP, palpitations, N/V/D, fevers, sick contacts, abdominal pain. Reports swelling, orthopnea. Review of systems is otherwise negative for all not mentioned in HPI.   PMHx Past Medical History:  Diagnosis Date   Acute lymphadenitis 2011   ALLERGIC RHINITIS 08/10/2007   ANXIETY 12/06/2007   [place] 06/13[place]2013   Aorta on CT June 2014   Cellulitis 2011   Cervical disc disease 03/09/2012   [place]ive [place]) [place]    Chronic pain 03/09/2012   DEPRESSION 12/06/2007   Diabetes [place]    DIVERTICULOSIS, [place] 12/06/2007   GERD 12/06/2007   Hepatitis    age [age]hepatitis A   HLD (hyperlipidemia) 05/24/2019   HYPERTENSION 12/06/2007   Lumbar disc disease 03/09/2012   Mesenteric adenitis    MRSA 2006   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 [place]   THORACIC/LUMBOSACRAL NEURITIS/RADICULITIS UNSPEC 12/28/2008    PSHx Past Surgical History:  Procedure Laterality Date   ABDOMINAL HYSTERECTOMY  1999    1 ovary left   bloo clot removed from neck   may 25th , june 2. 2010   [facility] may 24th 2010   COLONOSCOPY WITH PROPOFOL N/A 07/10/2016   Procedure: COLONOSCOPY WITH PROPOFOL;  Surgeon: SManus Gunning MD;  [facility];  Service: Gastroenterology;  Laterality: N/A;   COLONOSCOPY WITH PROPOFOL N/A 09/26/2019   Procedure: COLONOSCOPY WITH PROPOFOL;  Surgeon: AYetta Flock MD;  Location: [facility];  Service: Gastroenterology;  Laterality: N/A;   ESOPHAGOGASTRODUODENOSCOPY (EGD) WITH PROPOFOL N/A 07/10/2016   Procedure: ESOPHAGOGASTRODUODENOSCOPY (EGD) WITH PROPOFOL;  Surgeon: SManus Gunning MD;  Location: [facility];  Service: Gastroenterology;  Laterality: N/A;   POLYPECTOMY  09/26/2019   Procedure: POLYPECTOMY;  Surgeon: AYetta Flock MD;  Location: [facility];  Service: Gastroenterology;;   s/p ovary cyst     s/p right knee arthroscopy     Dr. LMardelle Matteortho    SocHx  reports that she quit smoking about 12 years ago. She has never used smokeless tobacco. She reports that she does not drink alcohol and does not use drugs.  Allergies  Allergen Reactions   Amoxicillin-Pot Clavulanate Nausea And Vomiting    Projectile vomiting Did it involve swelling of the face/tongue/throat, SOB, or low BP? No Did it involve sudden or severe rash/hives, skin peeling, or any reaction on the inside of your mouth or nose? No Did you need to seek medical attention at a hospital or doctor's  office? No When did it last happen?      20-30 years If all above answers are "NO", may proceed with cephalosporin use.    Adhesive [Tape] Other (See Comments)    Tears skin off - use paper tape    Codeine Other (See Comments)    hallucinations   Crestor [Rosuvastatin Calcium]     Severe muscle cramps   Lactose Intolerance (Gi) Nausea And Vomiting   Morphine And Related Nausea And Vomiting and Other (See Comments)    Migraine headaches   Vicodin  [Hydrocodone-Acetaminophen] Other (See Comments)    hallucinations   Latex Rash    FamHx Family History  Problem Relation Age of Onset   Heart disease Mother    Hypertension Mother    Diabetes Mother    Heart failure Mother    Asthma Sister    Anxiety disorder Sister    Depression Sister    Hypertension Father    Asthma Daughter    Bipolar disorder Daughter    Cancer Maternal Uncle        [place]   Cancer Other        ovarian   Liver cancer Paternal Grandmother        ????    Prior to Admission medications   Medication Sig Start Date End Date Taking? Authorizing Provider  Accu-Chek Softclix Lancets lancets Use as directed up to 4 times daily Patient taking differently: 1 each by Other route in the morning, at noon, in the evening, and at bedtime. 04/11/21   Elodia Florence., MD  acetaminophen (TYLENOL) 325 MG tablet Take 650 mg by mouth every 6 (six) hours as needed for headache (pain).    [provider]  albuterol (PROAIR HFA) 108 (90 Base) MCG/ACT inhaler Inhale 2 puffs into the lungs every 6 (six) hours as needed for wheezing or shortness of breath. 11/05/20   Lucretia Kern, DO  Blood Glucose Monitoring Suppl (ACCU-CHEK GUIDE) w/Device KIT Use as directed Patient taking differently: 1 each by Other route as directed. 04/11/21   Elodia Florence., MD  Cholecalciferol (HM VITAMIN D3) 100 MCG (4000 UT) CAPS Take 4,000 Units by mouth daily.    [provider]  clonazePAM (KLONOPIN) 1 MG tablet TAKE 1 TABLET BY MOUTH 2 TIMES A DAY AS NEEDED FOR ANXIETY Patient taking differently: Take 1 mg by mouth 2 (two) times daily as needed for anxiety. 12/10/20   Biagio Borg, MD  cyclobenzaprine (FLEXERIL) 10 MG tablet TAKE 1 TABLET BY MOUTH 2 TIMES A DAY AS NEEDED FOR MUSCLE SPASMS Patient taking differently: Take 10 mg by mouth 2 (two) times daily as needed for muscle spasms. 02/27/21   Biagio Borg, MD  Fexofenadine HCl (ALLEGRA PO) Take 180 mg by mouth as  needed.    [provider]  glucose blood (ACCU-CHEK GUIDE) test strip Use as instructed up to 4 times daily Patient taking differently: Use as instructed up to 4 times daily 04/11/21   Elodia Florence., MD  loperamide (IMODIUM) 2 MG capsule Take 2 mg by mouth as needed for diarrhea or loose stools.    [provider]  losartan (COZAAR) 50 MG tablet Take 1 tablet (50 mg total) by mouth daily. 09/09/21   Almyra Deforest, PA  metFORMIN (GLUCOPHAGE) 500 MG tablet Take 1 tablet (500 mg total) by mouth 2 (two) times daily with a meal. 08/26/21 10/25/21  Biagio Borg, MD  metoprolol tartrate (LOPRESSOR) 25 MG  tablet Take 1 tablet (25 mg total) by mouth 2 (two) times daily. 05/09/21 09/09/21  Minus Breeding, MD  NARCAN 4 MG/0.1ML LIQD nasal spray kit Place 0.4 mg into the nose daily as needed (opioid overdose). 04/25/19   [provider]  ondansetron (ZOFRAN ODT) 4 MG disintegrating tablet Take 1 tablet (4 mg total) by mouth every 4 (four) hours as needed for nausea or vomiting. 08/23/19   Charlesetta Shanks, MD  Oxycodone HCl 10 MG TABS Take 1 tablet (10 mg total) 4 (four) times daily by mouth. Per [facility] Patient taking differently: Take 10 mg by mouth every 4 (four) hours as needed (pain). Per [facility] 11/08/17   Biagio Borg, MD  pantoprazole (PROTONIX) 40 MG tablet TAKE ONE TABLET BY MOUTH DAILY Patient taking differently: Take 40 mg by mouth daily. 03/03/21   Biagio Borg, MD  Polyethyl Glycol-Propyl Glycol 0.4-0.3 % SOLN Apply 1 drop to eye daily as needed.    [provider]  polyethylene glycol powder (GLYCOLAX/MIRALAX) 17 GM/SCOOP powder Take 17 g by mouth daily as needed for mild constipation. 04/11/21   Elodia Florence., MD  potassium chloride SA (KLOR-CON) 20 MEQ tablet Take 1 tablet (20 mEq total) by mouth 2 (two) times daily. 06/05/21 09/09/21  Minus Breeding, MD  rOPINIRole (REQUIP) 1 MG tablet Take 1 tablet (1 mg total) by mouth 3  (three) times daily. 07/07/21   Biagio Borg, MD  Simethicone (GAS-X EXTRA STRENGTH PO) Take 1 tablet by mouth daily as needed (gas).    [provider]  torsemide (DEMADEX) 20 MG tablet Take 20 mg  4 tablets ( 80 mg ) twice a day.May take a extra 20 mg daily if weight over 370 lbs. 09/11/21   Almyra Deforest, PA  DULoxetine (CYMBALTA) 60 MG capsule Take 60 mg by mouth daily.    03/09/12  [provider]    Physical Exam: Vitals:   09/19/21 1259 09/19/21 1348 09/19/21 1437 09/19/21 1530  BP: (!) 125/56  (!) 108/45 (!) 123/57  Pulse: (!) 55  (!) 55 (!) 59  Resp: _0 Temp:  98.8 F (37.1 C)    TempSrc:  Rectal    SpO2: 100%  96% 96%    General: 65 y.o. female resting in bed in NAD Eyes: PERRL, normal sclera ENMT: Nares patent w/o discharge, orophaynx clear, dentition normal, ears w/o discharge/lesions/ulcers Neck: Supple, trachea midline Cardiovascular: RRR, +S1, S2, no m/g/r, equal pulses throughout Respiratory: CTABL, no w/r/r, normal WOB GI: BS+, NDNT, obese, no masses noted, no organomegaly noted MSK: No c/c; BLE edema, venous stasis Skin: No rashes, bruises, ulcerations noted Neuro: A&O x 3, no focal deficits Psyc: Appropriate interaction and affect, calm/cooperative  Labs on Admission: I have personally reviewed following labs and imaging studies  CBC: Recent Labs  Lab 09/19/21 1228  WBC 8.0  NEUTROABS 4.6  HGB 11.6*  HCT 36.1  MCV 94.0  PLT 478   Basic Metabolic Panel: Recent Labs  Lab 09/19/21 1228  NA 141  K 3.6  CL 99  CO2 32  GLUCOSE 130*  BUN 15  CREATININE 1.07*  CALCIUM 8.8*   GFR: Estimated Creatinine Clearance: 87 mL/min (A) (by C-G formula based on SCr of 1.07 mg/dL (H)). Liver Function Tests: Recent Labs  Lab 09/19/21 1228  AST 19  ALT 15  ALKPHOS 69  BILITOT 1.1  PROT 7.3  ALBUMIN 3.8   No results for input(s): LIPASE,  AMYLASE in the last 168 hours. No results for input(s): AMMONIA in the last 168  hours. Coagulation Profile: No results for input(s): INR, PROTIME in the last 168 hours. Cardiac Enzymes: No results for input(s): CKTOTAL, CKMB, CKMBINDEX, TROPONINI in the last 168 hours. BNP (last 3 results) No results for input(s): PROBNP in the last 8760 hours. HbA1C: No results for input(s): HGBA1C in the last 72 hours. CBG: Recent Labs  Lab 09/19/21 1227  GLUCAP 126*   Lipid Profile: No results for input(s): CHOL, HDL, LDLCALC, TRIG, CHOLHDL, LDLDIRECT in the last 72 hours. Thyroid Function Tests: No results for input(s): TSH, T4TOTAL, FREET4, T3FREE, THYROIDAB in the last 72 hours. Anemia Panel: No results for input(s): VITAMINB12, FOLATE, FERRITIN, TIBC, IRON, RETICCTPCT in the last 72 hours. Urine analysis:    Component Value Date/Time   COLORURINE YELLOW 09/19/2021 1400   APPEARANCEUR HAZY (A) 09/19/2021 1400   LABSPEC 1.005 09/19/2021 1400   PHURINE 7.0 09/19/2021 1400   GLUCOSEU NEGATIVE 09/19/2021 1400   GLUCOSEU NEGATIVE 07/07/2021 1621   HGBUR SMALL (A) 09/19/2021 1400   BILIRUBINUR NEGATIVE 09/19/2021 1400   KETONESUR NEGATIVE 09/19/2021 1400   PROTEINUR NEGATIVE 09/19/2021 1400   UROBILINOGEN 0.2 07/07/2021 1621   NITRITE NEGATIVE 09/19/2021 1400   LEUKOCYTESUR MODERATE (A) 09/19/2021 1400    Radiological Exams on Admission: DG Chest Port 1 View  Result Date: 09/19/2021 CLINICAL DATA:  Shortness of breath.  Evaluate for CHF. EXAM: PORTABLE CHEST 1 VIEW COMPARISON:  08/10/2021 FINDINGS: Stable cardiomediastinal contours. No pleural effusion or interstitial edema. No airspace densities. Review of the visualized osseous structures is unremarkable. IMPRESSION: No acute cardiopulmonary abnormalities. Electronically Signed   By: Kerby Moors M.D.   On: 09/19/2021 13:05    EKG: Independently reviewed. Sinus brady  Assessment/Plan Acute on chronic diastolic HF Volume overload     - admit to inpt, tele     - lasix 49m IV BID     - check echo     - trp  at neg x 2; BNP is 34     - fluid restriction to 1200cc     - I&O, daily wts   DM2     - SSI, A1c, glucose checks, DM diet  Morbid obesity     - counsel on diet, lifestyle changes; follow up outpt  Anxiety     - continue home regimen  GERD     - PPI  HTN     - continue home regimen  DVT prophylaxis: lovenox  Code Status: FULL  Family Communication: None at bedside  Consults called: None   Status is: Inpatient  Remains inpatient appropriate because:Inpatient level of care appropriate due to severity of illness  Dispo: The patient is from: Home              Anticipated d/c is to: Home              Patient currently is not medically stable to d/c.   Difficult to place patient No  TJonnie FinnerDO [facility]  If 7PM-7AM, please contact night-coverage [email or web address]  09/19/2021, 3:49 PM

## 2021-09-19 NOTE — Progress Notes (Addendum)
Patient c/o 8-9/10 BLE & back pain with no pain medications ordered. Paged Jeannette Corpus. Awaiting response.   One-time order for 50 mcg IV fentanyl ordered and given. PRN order for tylenol 650mg  ordered and given. Will continue to monitor.    0600 - pt c/o pain 9-10 in same location. Paged Jeannette Corpus. New order for 50 mg PO ultram one-time dose. Administered ultram and PRN PO tylenol. Will continue to monitor.

## 2021-09-19 NOTE — ED Provider Notes (Signed)
[facility] EMERGENCY DEPARTMENT Provider Note  CSN: [id number] Arrival date & time: 09/19/21 1111    History Chief Complaint  Patient presents with   Shortness of Breath    Maria [name] is a 65 y.o. female with history of diastolic dysfunction, peripheral edema. Reports increased weight (18lbs over 10 days per home log), DOE and orthopnea. She has been seen in the ED, at Cardiology and with PCP in recent weeks for similar with increased oral diuretics which have not been effective for her at home.   She has chronic LE pain as well. She has not had a known fever at home but EMS reported she was febrile with them. She denies any CP, mild cough. Some dysuria, treated for UTI at ED visit in August.    Past Medical History:  Diagnosis Date   Acute lymphadenitis 2011   ALLERGIC RHINITIS 08/10/2007   ANXIETY 12/06/2007   Atherosclerotic peripheral vascular disease ([place]) 06/13/2013   Aorta on CT June 2014   Cellulitis 2011   Cervical disc disease 03/09/2012   CHF (congestive heart failure) ([place])    Chronic pain 03/09/2012   DEPRESSION 12/06/2007   Diabetes ([place])    DIVERTICULOSIS, [place] 12/06/2007   GERD 12/06/2007   Hepatitis    age 34 hepatitis A   HLD (hyperlipidemia) 05/24/2019   HYPERTENSION 12/06/2007   Lumbar disc disease 03/09/2012   Mesenteric adenitis    MRSA 2006   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 2010   [place]  may 24th 2010   COLONOSCOPY WITH PROPOFOL N/A 07/10/2016   Procedure: COLONOSCOPY WITH PROPOFOL;  Surgeon: Manus Gunning, MD;  Location: [facility];  Service: Gastroenterology;  Laterality: N/A;   COLONOSCOPY WITH PROPOFOL N/A 09/26/2019   Procedure: COLONOSCOPY WITH PROPOFOL;  Surgeon: Yetta Flock, MD;   Location: [facility];  Service: Gastroenterology;  Laterality: N/A;   ESOPHAGOGASTRODUODENOSCOPY (EGD) WITH PROPOFOL N/A 07/10/2016   Procedure: ESOPHAGOGASTRODUODENOSCOPY (EGD) WITH PROPOFOL;  Surgeon: Manus Gunning, MD;  Location: [facility];  Service: Gastroenterology;  Laterality: N/A;   POLYPECTOMY  09/26/2019   Procedure: POLYPECTOMY;  Surgeon: Yetta Flock, MD;  Location: [facility];  Service: Gastroenterology;;   s/p ovary cyst     s/p right knee arthroscopy     Dr. Mardelle Matte ortho    Family History  Problem Relation Age of Onset   Heart disease Mother    Hypertension Mother    Diabetes Mother    Heart failure Mother    Asthma Sister    Anxiety disorder Sister    Depression Sister    Hypertension Father    Asthma Daughter    Bipolar disorder Daughter    Cancer Maternal Uncle        [place]   Cancer Other        ovarian   Liver cancer Paternal Grandmother        ????    Social History   Tobacco Use   Smoking status: Former    Years: 30.00    Types: Cigarettes    Quit date: 11/08/2008    Years since quitting: 12.8   Smokeless tobacco: Never   Tobacco comments:    quit 10/09  Vaping Use   Vaping Use: Never used  Substance Use Topics   Alcohol use: No    Comment: quit drinking 10/07/1997   Drug use: No     Home Medications Prior to Admission medications   Medication Sig Start Date End Date Taking? Authorizing Provider  acetaminophen (TYLENOL) 325 MG tablet Take 650 mg by mouth every 6 (six) hours as needed for headache or fever (pain).   Yes [provider]  albuterol (PROAIR HFA) 108 (90 Base) MCG/ACT inhaler Inhale 2 puffs into the lungs every 6 (six) hours as needed for wheezing or shortness of breath. 11/05/20  Yes Lucretia Kern, DO  Cholecalciferol (VITAMIN D3) 50 MCG (2000 UT) CAPS Take 2 capsules by mouth daily.   Yes [provider]  clonazePAM (KLONOPIN) 1 MG tablet TAKE 1 TABLET BY MOUTH 2 TIMES A DAY AS NEEDED  FOR ANXIETY Patient taking differently: Take 1 mg by mouth 2 (two) times daily as needed for anxiety. 12/10/20  Yes Biagio Borg, MD  cyclobenzaprine (FLEXERIL) 10 MG tablet TAKE 1 TABLET BY MOUTH 2 TIMES A DAY AS NEEDED FOR MUSCLE SPASMS Patient taking differently: Take 10 mg by mouth 2 (two) times daily as needed for muscle spasms. 02/27/21  Yes Biagio Borg, MD  Fexofenadine HCl (ALLEGRA PO) Take 180 mg by mouth daily as needed (allergies).   Yes [provider]  loperamide (IMODIUM) 2 MG capsule Take 2 mg by mouth as needed for diarrhea or loose stools.   Yes [provider]  losartan (COZAAR) 50 MG tablet Take 1 tablet (50 mg total) by mouth daily. 09/09/21  Yes Meng, Isaac Laud, PA  Metamucil Fiber CHEW Chew 3 tablets by mouth at bedtime.   Yes [provider]  metFORMIN (GLUCOPHAGE) 500 MG tablet Take 1 tablet (500 mg total) by mouth 2 (two) times daily with a meal. 08/26/21 10/25/21 Yes Biagio Borg, MD  metoprolol tartrate (LOPRESSOR) 25 MG tablet Take 25 mg by mouth 2 (two) times daily.   Yes [provider]  NARCAN 4 MG/0.1ML LIQD nasal spray kit Place 0.4 mg into the nose daily as needed (opioid overdose). 04/25/19  Yes [provider]  Oxycodone HCl 10 MG TABS Take 1 tablet (10 mg total) 4 (four) times daily by mouth. Per [facility] Patient taking differently: Take 10 mg by mouth every 4 (four) hours as needed (pain). Per [facility] 11/08/17  Yes Biagio Borg, MD  pantoprazole (PROTONIX) 40 MG tablet TAKE ONE TABLET BY MOUTH DAILY Patient taking differently: Take 40 mg by mouth daily. 03/03/21  Yes Biagio Borg, MD  Polyethyl Glycol-Propyl Glycol 0.4-0.3 % SOLN Apply 1 drop to eye daily as needed (dry eyes).   Yes [provider]  potassium chloride SA (KLOR-CON) 20 MEQ tablet Take 20 mEq by mouth 2 (two) times daily.   Yes [provider]  rOPINIRole (REQUIP) 1 MG tablet Take 1 tablet (1 mg total) by mouth 3  (three) times daily. 07/07/21  Yes Biagio Borg, MD  Simethicone (GAS-X EXTRA STRENGTH PO) Take 1 tablet by mouth daily as needed (gas).   Yes [provider]  torsemide (DEMADEX) 20 MG tablet Take 20 mg  4 tablets ( 80 mg ) twice a day.May take a extra 20 mg daily if weight over 370 lbs. 09/11/21  Yes Almyra Deforest, PA  Accu-Chek Softclix Lancets lancets Use as directed up to 4 times daily Patient taking differently: 1 each by Other route in the morning, at noon, in  the evening, and at bedtime. 04/11/21   Elodia Florence., MD  Blood Glucose Monitoring Suppl (ACCU-CHEK GUIDE) w/Device KIT Use as directed Patient taking differently: 1 each by Other route as directed. 04/11/21   Elodia Florence., MD  glucose blood (ACCU-CHEK GUIDE) test strip Use as instructed up to 4 times daily Patient taking differently: Use as instructed up to 4 times daily 04/11/21   Elodia Florence., MD  metoprolol tartrate (LOPRESSOR) 25 MG tablet Take 1 tablet (25 mg total) by mouth 2 (two) times daily. Patient not taking: Reported on 09/19/2021 05/09/21 09/09/21  Minus Breeding, MD  ondansetron (ZOFRAN ODT) 4 MG disintegrating tablet Take 1 tablet (4 mg total) by mouth every 4 (four) hours as needed for nausea or vomiting. Patient not taking: Reported on 09/19/2021 08/23/19   Charlesetta Shanks, MD  polyethylene glycol powder (GLYCOLAX/MIRALAX) 17 GM/SCOOP powder Take 17 g by mouth daily as needed for mild constipation. Patient not taking: Reported on 09/19/2021 04/11/21   Elodia Florence., MD  potassium chloride SA (KLOR-CON) 20 MEQ tablet Take 1 tablet (20 mEq total) by mouth 2 (two) times daily. 06/05/21 09/09/21  Minus Breeding, MD  DULoxetine (CYMBALTA) 60 MG capsule Take 60 mg by mouth daily.    03/09/12  [provider]     Allergies    Amoxicillin-pot clavulanate, Adhesive [tape], Codeine, Crestor [rosuvastatin calcium], Lactose intolerance (gi), Morphine and related, Vicodin  [hydrocodone-acetaminophen], and Latex   Review of Systems   Review of Systems A comprehensive review of systems was completed and negative except as noted in HPI.    Physical Exam BP (!) 123/57   Pulse (!) 59   Temp 98.8 F (37.1 C) (Rectal)   Resp 15   SpO2 96%   Physical Exam Vitals and nursing note reviewed.  Constitutional:      Appearance: Normal appearance. She is obese.  HENT:     Head: Normocephalic and atraumatic.     Nose: Nose normal.     Mouth/Throat:     Mouth: Mucous membranes are moist.  Eyes:     Extraocular Movements: Extraocular movements intact.     Conjunctiva/sclera: Conjunctivae normal.  Cardiovascular:     Rate and Rhythm: Normal rate.  Pulmonary:     Effort: Pulmonary effort is normal.     Breath sounds: Normal breath sounds.  Abdominal:     General: Abdomen is flat.     Palpations: Abdomen is soft.     Tenderness: There is no abdominal tenderness.  Musculoskeletal:        General: No swelling. Normal range of motion.     Cervical back: Neck supple.     Right lower leg: Edema present.     Left lower leg: Edema present.  Skin:    General: Skin is warm and dry.     Comments: Chronic BLE skin changes  Neurological:     General: No focal deficit present.     Mental Status: She is alert.  Psychiatric:        Mood and Affect: Mood normal.      ED Results / Procedures / Treatments   Labs (all labs ordered are listed, but only abnormal results are displayed) Labs Reviewed  COMPREHENSIVE METABOLIC PANEL - Abnormal; Notable for the following components:      Result Value   Glucose, Bld 130 (*)    Creatinine, Ser 1.07 (*)    Calcium 8.8 (*)    GFR, Estimated  58 (*)    All other components within normal limits  CBC WITH DIFFERENTIAL/PLATELET - Abnormal; Notable for the following components:   RBC 3.84 (*)    Hemoglobin 11.6 (*)    All other components within normal limits  URINALYSIS, ROUTINE W REFLEX MICROSCOPIC - Abnormal; Notable  for the following components:   APPearance HAZY (*)    Hgb urine dipstick SMALL (*)    Leukocytes,Ua MODERATE (*)    Bacteria, UA RARE (*)    All other components within normal limits  CBG MONITORING, ED - Abnormal; Notable for the following components:   Glucose-Capillary 126 (*)    All other components within normal limits  RESP PANEL BY RT-PCR (FLU A&B, COVID) ARPGX2  BRAIN NATRIURETIC PEPTIDE  TROPONIN I (HIGH SENSITIVITY)  TROPONIN I (HIGH SENSITIVITY)    EKG None  Radiology DG Chest Port 1 View  Result Date: 09/19/2021 CLINICAL DATA:  Shortness of breath.  Evaluate for CHF. EXAM: PORTABLE CHEST 1 VIEW COMPARISON:  08/10/2021 FINDINGS: Stable cardiomediastinal contours. No pleural effusion or interstitial edema. No airspace densities. Review of the visualized osseous structures is unremarkable. IMPRESSION: No acute cardiopulmonary abnormalities. Electronically Signed   By: Kerby Moors M.D.   On: 09/19/2021 13:05    Procedures Procedures  Medications Ordered in the ED Medications  fentaNYL (SUBLIMAZE) injection 50 mcg (50 mcg Intravenous Given 09/19/21 1506)  furosemide (LASIX) injection 80 mg (80 mg Intravenous Given 09/19/21 1507)     MDM Rules/Calculators/A&P MDM Patient with peripheral edema and significant weight gain. Some SOB but no distress on arrival. Not febrile here but reported febrile with EMS. Will check rectal to confirm. Check labs, CXR and UA for her edema. Expand to infectious workup if confirmed febrile.   ED Course  I have reviewed the triage vital signs and the nursing notes.  Pertinent labs & imaging results that were available during my care of the patient were reviewed by me and considered in my medical decision making (see chart for details).  Clinical Course as of 09/19/21 1651  Fri Sep 19, 2021  1235 CBG is unremarkable [CS]  1791 CBC with mild anemia, otherwise unremarkable.  [CS]  5056 CXR is clear [CS]  9794 CMP and Trop are  unremarkable.  [CS]  8016 MUSE Error precludes interpretation in MUSE EKG:  Sinus rhythm Rate 55 Low voltage Normal axis No significant change [CS]  1454 BNP is normal, similar to previous. Will give lasix IV, fentanyl for leg pain. Plan admission for further diuresis given lack of improvement with oral treatment at home.  [CS]  1516 Spoke with Dr. Marylyn Ishihara, Hospitalist, who will evaluate for admission. [CS]  1544 UA is neg.  [CS]    Clinical Course User Index [CS] Truddie Hidden, MD    Final Clinical Impression(s) / ED Diagnoses Final diagnoses:  Peripheral edema  Acute on chronic diastolic heart failure [facility])    Rx / DC Orders ED Discharge Orders     None        Truddie Hidden, MD 09/19/21 1651

## 2021-09-20 ENCOUNTER — Inpatient Hospital Stay (HOSPITAL_COMMUNITY): Payer: Medicare Other

## 2021-09-20 DIAGNOSIS — L8990 Pressure ulcer of unspecified site, unspecified stage: Secondary | ICD-10-CM | POA: Diagnosis present

## 2021-09-20 DIAGNOSIS — I5031 Acute diastolic (congestive) heart failure: Secondary | ICD-10-CM | POA: Diagnosis not present

## 2021-09-20 DIAGNOSIS — L89152 Pressure ulcer of sacral region, stage 2: Secondary | ICD-10-CM | POA: Diagnosis present

## 2021-09-20 LAB — COMPREHENSIVE METABOLIC PANEL
ALT: 14 U/L (ref 0–44)
AST: 17 U/L (ref 15–41)
Albumin: 3.7 g/dL (ref 3.5–5.0)
Alkaline Phosphatase: 62 U/L (ref 38–126)
Anion gap: 8 (ref 5–15)
BUN: 12 mg/dL (ref 8–23)
CO2: 35 mmol/L — ABNORMAL HIGH (ref 22–32)
Calcium: 8.9 mg/dL (ref 8.9–10.3)
Chloride: 102 mmol/L (ref 98–111)
Creatinine, Ser: 1 mg/dL (ref 0.44–1.00)
GFR, Estimated: >60 mL/min (ref >60)
Glucose, Bld: 138 mg/dL — ABNORMAL HIGH (ref 70–99)
Potassium: 3.8 mmol/L (ref 3.5–5.1)
Sodium: 145 mmol/L (ref 135–145)
Total Bilirubin: 1.2 mg/dL (ref 0.3–1.2)
Total Protein: 7 g/dL (ref 6.5–8.1)

## 2021-09-20 LAB — CBC
HCT: 37 % (ref 36.0–46.0)
Hemoglobin: 12 g/dL (ref 12.0–15.0)
MCH: 30.3 pg (ref 26.0–34.0)
MCHC: 32.4 g/dL (ref 30.0–36.0)
MCV: 93.4 fL (ref 80.0–100.0)
Platelets: 228 10*3/uL (ref 150–400)
RBC: 3.96 MIL/uL (ref 3.87–5.11)
RDW: 12.5 % (ref 11.5–15.5)
WBC: 6.7 10*3/uL (ref 4.0–10.5)
nRBC: 0 % (ref 0.0–0.2)

## 2021-09-20 LAB — ECHOCARDIOGRAM COMPLETE
Area-P 1/2: 2.37 cm2
Height: 65 in
S' Lateral: 3.1 cm
Weight: 6003.57 oz

## 2021-09-20 LAB — GLUCOSE, CAPILLARY
Glucose-Capillary: 125 mg/dL — ABNORMAL HIGH (ref 70–99)
Glucose-Capillary: 113 mg/dL — ABNORMAL HIGH (ref 70–99)
Glucose-Capillary: 116 mg/dL — ABNORMAL HIGH (ref 70–99)
Glucose-Capillary: 144 mg/dL — ABNORMAL HIGH (ref 70–99)

## 2021-09-20 LAB — HEMOGLOBIN A1C
Hgb A1c MFr Bld: 6.1 % — ABNORMAL HIGH (ref 4.8–5.6)
Mean Plasma Glucose: 128.37 mg/dL

## 2021-09-20 MED ORDER — HYDROMORPHONE HCL 1 MG/ML IJ SOLN
1.0000 mg | Freq: Once | INTRAMUSCULAR | Status: AC
  Administered 2021-09-20: 1 mg via INTRAVENOUS
  Filled 2021-09-20: qty 1

## 2021-09-20 MED ORDER — OXYCODONE HCL 5 MG PO TABS
10.0000 mg | ORAL_TABLET | ORAL | Status: DC | PRN
  Administered 2021-09-20 – 2021-09-25 (×27): 10 mg via ORAL
  Filled 2021-09-20 (×27): qty 2

## 2021-09-20 MED ORDER — FUROSEMIDE 10 MG/ML IJ SOLN
80.0000 mg | Freq: Two times a day (BID) | INTRAMUSCULAR | Status: DC
  Administered 2021-09-20 – 2021-09-22 (×4): 80 mg via INTRAVENOUS
  Filled 2021-09-20 (×4): qty 8

## 2021-09-20 MED ORDER — TRAMADOL HCL 50 MG PO TABS
50.0000 mg | ORAL_TABLET | Freq: Once | ORAL | Status: AC
  Administered 2021-09-20: 50 mg via ORAL
  Filled 2021-09-20: qty 1

## 2021-09-20 NOTE — Progress Notes (Signed)
PROGRESS NOTE  Maria [name]  RCB:[id number] DOB: 27-Dec-1955 DOA: 09/19/2021 PCP: Biagio Borg, MD   Brief Narrative: Maria [name] is a 65 y.o. female with a history of morbid obesity, chronic HFpEF, T2DM, HTN who presented with a month of worsening weight gain, leg swelling and orthopnea despite increases in oral diuretic regimen by PCP and cardiology. In the ED she was not hypoxic, but exam consistent with overload. CXR clear, troponin negative, BNP 34. IV lasix was started with fair diuretic response and hospitalists consulted for admission.  Assessment & Plan: Active Problems:   Acute on chronic heart failure (HCC)   Pressure injury of skin  Acute on chronic HFpEF, HTN:  - SCr stable, UOP negative 1.85L since admission, weight down. Will increase lasix to 80mg  IV TID (on home torsemide 60mg  BID). Continue I/O, weights. Continue BID K supp. - Continue home metoprolol, losartan - Echocardiogram repeat is ordered, previously LVEF 60-65%, G1DD, normal RV.  T2DM: Well-controlled with HbA1c 6.1%.  - Hold metformin - SSI  Chronic pain: Widespread involving back and large LE joints. At baseline.  - Continue oxycodone per outpatient pain management. Will give single IV dose as her home regimen was not restarted until just this morning and her pain is severe. No ongoing indication for IV narcotics.  - Continue flexeril - Follow up with pain management, given regularity of oxycodone at home, consider consolidation into long-acting formulation  Stage 2 sacral pressure injury POA: Offload as able. Mobilize.   Morbid obesity: Estimated body mass index is 62.44 kg/m as calculated from the following:   Height as of this encounter: 5\' 5"  (1.651 m).   Weight as of this encounter: 170.2 kg.  DVT prophylaxis: Lovenox 90mg  q24h Code Status: Full Family Communication: None at bedside Disposition Plan:  Status is: Inpatient  Remains inpatient appropriate because:Inpatient level of care  appropriate due to severity of illness  Dispo: The patient is from: Home              Anticipated d/c is to: Home              Patient currently is not medically stable to d/c.  Consultants:  None  Procedures:  None  Antimicrobials: None   Subjective: Orthopnea is unchanged, swelling in legs is possibly modestly better than at admission. No chest pain. Primary issue is right lower back pain worse with movement not associated with radicular symptoms, exacerbated pain due to not having oxycodone as she does at home which she takes q4-6h chronically.  Objective: Vitals:   09/19/21 2226 09/20/21 0447 09/20/21 0500 09/20/21 0734  BP: (!) 107/43 (!) 116/48  (!) 139/51  Pulse: 64 65  66  Resp: 18 18  16   Temp: 97.8 F (36.6 C) 98.4 F (36.9 C)  98.6 F (37 C)  TempSrc:  Oral  Oral  SpO2: 95% 99%  93%  Weight:   (!) 170.2 kg   Height:        Intake/Output Summary (Last 24 hours) at 09/20/2021 1151 Last data filed at 09/20/2021 1059 Gross per 24 hour  Intake 120 ml  Output 2950 ml  Net -2830 ml   Filed Weights   09/20/21 0500  Weight: (!) 170.2 kg    Gen: 65 y.o. female in no distress  Pulm: Non-labored breathing room air, diminished. Clear to auscultation bilaterally.  CV: Regular rate and rhythm. No murmur, rub, or gallop. No JVD, 2+ pitting LE edema with tenderness and symmetric erythema without  induration GI: Abdomen soft, non-tender, non-distended, with normoactive bowel sounds. No organomegaly or masses felt. Ext: Warm, no deformities. TTP in spastic right paraspinal musculature. No midline tenderness. Skin: No ulcers on visualized skin. Neuro: Alert and oriented. No focal neurological deficits. Psych: Judgement and insight appear normal. Mood & affect appropriate.   Data Reviewed: I have personally reviewed following labs and imaging studies  CBC: Recent Labs  Lab 09/19/21 1228 09/20/21 0518  WBC 8.0 6.7  NEUTROABS 4.6  --   HGB 11.6* 12.0  HCT 36.1 37.0   MCV 94.0 93.4  PLT 250 382   Basic Metabolic Panel: Recent Labs  Lab 09/19/21 1228 09/20/21 0518  NA 141 145  K 3.6 3.8  CL 99 102  CO2 32 35*  GLUCOSE 130* 138*  BUN 15 12  CREATININE 1.07* 1.00  CALCIUM 8.8* 8.9   GFR: Estimated Creatinine Clearance: 91.8 mL/min (by C-G formula based on SCr of 1 mg/dL). Liver Function Tests: Recent Labs  Lab 09/19/21 1228 09/20/21 0518  AST 19 17  ALT 15 14  ALKPHOS 69 62  BILITOT 1.1 1.2  PROT 7.3 7.0  ALBUMIN 3.8 3.7   No results for input(s): LIPASE, AMYLASE in the last 168 hours. No results for input(s): AMMONIA in the last 168 hours. Coagulation Profile: No results for input(s): INR, PROTIME in the last 168 hours. Cardiac Enzymes: No results for input(s): CKTOTAL, CKMB, CKMBINDEX, TROPONINI in the last 168 hours. BNP (last 3 results) No results for input(s): PROBNP in the last 8760 hours. HbA1C: Recent Labs    09/20/21 0518  HGBA1C 6.1*   CBG: Recent Labs  Lab 09/19/21 1227 09/19/21 2127 09/20/21 0730  GLUCAP 126* 101* 125*   Lipid Profile: No results for input(s): CHOL, HDL, LDLCALC, TRIG, CHOLHDL, LDLDIRECT in the last 72 hours. Thyroid Function Tests: No results for input(s): TSH, T4TOTAL, FREET4, T3FREE, THYROIDAB in the last 72 hours. Anemia Panel: No results for input(s): VITAMINB12, FOLATE, FERRITIN, TIBC, IRON, RETICCTPCT in the last 72 hours. Urine analysis:    Component Value Date/Time   COLORURINE YELLOW 09/19/2021 1400   APPEARANCEUR HAZY (A) 09/19/2021 1400   LABSPEC 1.005 09/19/2021 1400   PHURINE 7.0 09/19/2021 1400   GLUCOSEU NEGATIVE 09/19/2021 1400   GLUCOSEU NEGATIVE 07/07/2021 1621   HGBUR SMALL (A) 09/19/2021 1400   BILIRUBINUR NEGATIVE 09/19/2021 1400   KETONESUR NEGATIVE 09/19/2021 1400   PROTEINUR NEGATIVE 09/19/2021 1400   UROBILINOGEN 0.2 07/07/2021 1621   NITRITE NEGATIVE 09/19/2021 1400   LEUKOCYTESUR MODERATE (A) 09/19/2021 1400   Recent Results (from the past 240  hour(s))  Resp Panel by RT-PCR (Flu A&B, Covid) Nasopharyngeal Swab     Status: None   Collection Time: 09/19/21 12:28 PM   Specimen: Nasopharyngeal Swab; Nasopharyngeal(NP) swabs in vial transport medium  Result Value Ref Range Status   SARS Coronavirus 2 by RT PCR NEGATIVE NEGATIVE Final    Comment: (NOTE) SARS-CoV-2 target nucleic acids are NOT DETECTED.  The SARS-CoV-2 RNA is generally detectable in upper respiratory specimens during the acute phase of infection. The lowest concentration of SARS-CoV-2 viral copies this assay can detect is 138 copies/mL. A negative result does not preclude SARS-Cov-2 infection and should not be used as the sole basis for treatment or other patient management decisions. A negative result may occur with  improper specimen collection/handling, submission of specimen other than nasopharyngeal swab, presence of viral mutation(s) within the areas targeted by this assay, and inadequate number of viral copies(<138 copies/mL). A negative  result must be combined with clinical observations, patient history, and epidemiological information. The expected result is Negative.  Fact Sheet for Patients:  EntrepreneurPulse.com.au  Fact Sheet for Healthcare Providers:  IncredibleEmployment.be  This test is no t yet approved or cleared by the [place] FDA and  has been authorized for detection and/or diagnosis of SARS-CoV-2 by FDA under an Emergency Use Authorization (EUA). This EUA will remain  in effect (meaning this test can be used) for the duration of the COVID-19 declaration under Section 564(b)(1) of the Act, 21 U.S.C.section 360bbb-3(b)(1), unless the authorization is terminated  or revoked sooner.       Influenza A by PCR NEGATIVE NEGATIVE Final   Influenza B by PCR NEGATIVE NEGATIVE Final    Comment: (NOTE) The Xpert Xpress SARS-CoV-2/FLU/RSV plus assay is intended as an aid in the diagnosis of influenza from  Nasopharyngeal swab specimens and should not be used as a sole basis for treatment. Nasal washings and aspirates are unacceptable for Xpert Xpress SARS-CoV-2/FLU/RSV testing.  Fact Sheet for Patients: EntrepreneurPulse.com.au  Fact Sheet for Healthcare Providers: IncredibleEmployment.be  This test is not yet approved or cleared by the [place] FDA and has been authorized for detection and/or diagnosis of SARS-CoV-2 by FDA under an Emergency Use Authorization (EUA). This EUA will remain in effect (meaning this test can be used) for the duration of the COVID-19 declaration under Section 564(b)(1) of the Act, 21 U.S.C. section 360bbb-3(b)(1), unless the authorization is terminated or revoked.  Performed at [facility], [place] [address] [place], [place] [address]       Radiology Studies: [facility] Chest Port 1 View  Result Date: 09/19/2021 CLINICAL DATA:  Shortness of breath.  Evaluate for CHF. EXAM: PORTABLE CHEST 1 VIEW COMPARISON:  08/10/2021 FINDINGS: Stable cardiomediastinal contours. No pleural effusion or interstitial edema. No airspace densities. Review of the visualized osseous structures is unremarkable. IMPRESSION: No acute cardiopulmonary abnormalities. Electronically Signed   By: Kerby Moors M.D.   On: 09/19/2021 13:05    Scheduled Meds:  enoxaparin (LOVENOX) injection  90 mg Subcutaneous Daily   furosemide  60 mg Intravenous BID   insulin aspart  0-15 Units Subcutaneous TID WC   losartan  50 mg Oral Daily   metoprolol tartrate  25 mg Oral BID   pantoprazole  40 mg Oral Daily   potassium chloride SA  20 mEq Oral BID   rOPINIRole  1 mg Oral TID   Continuous Infusions:   LOS: 1 day   Time spent: 35 minutes.  Patrecia Pour, MD [facility] [email or web address] 09/20/2021, 11:51 AM

## 2021-09-20 NOTE — Plan of Care (Signed)
  Problem: Coping: Goal: Level of anxiety will decrease Outcome: Progressing   Problem: Pain Managment: Goal: General experience of comfort will improve Outcome: Progressing   

## 2021-09-20 NOTE — Progress Notes (Signed)
Echocardiogram 2D Echocardiogram has been performed.  Oneal Deputy Jahnia Hewes RDCS 09/20/2021, 2:30 PM

## 2021-09-21 LAB — BASIC METABOLIC PANEL
Anion gap: 8 (ref 5–15)
BUN: 14 mg/dL (ref 8–23)
CO2: 36 mmol/L — ABNORMAL HIGH (ref 22–32)
Calcium: 9.1 mg/dL (ref 8.9–10.3)
Chloride: 103 mmol/L (ref 98–111)
Creatinine, Ser: 1.1 mg/dL — ABNORMAL HIGH (ref 0.44–1.00)
GFR, Estimated: 56 mL/min — ABNORMAL LOW (ref >60)
Glucose, Bld: 124 mg/dL — ABNORMAL HIGH (ref 70–99)
Potassium: 4.5 mmol/L (ref 3.5–5.1)
Sodium: 147 mmol/L — ABNORMAL HIGH (ref 135–145)

## 2021-09-21 LAB — GLUCOSE, CAPILLARY
Glucose-Capillary: 110 mg/dL — ABNORMAL HIGH (ref 70–99)
Glucose-Capillary: 104 mg/dL — ABNORMAL HIGH (ref 70–99)
Glucose-Capillary: 126 mg/dL — ABNORMAL HIGH (ref 70–99)
Glucose-Capillary: 150 mg/dL — ABNORMAL HIGH (ref 70–99)

## 2021-09-21 NOTE — Evaluation (Signed)
Occupational Therapy Evaluation Patient Details Name: Maria [name] MRN: [id number] DOB: Jun 18, 1956 Today's Date: 09/21/2021   History of Present Illness Patient is a 65 year old female who presented to the hosptial on 9/30 with weight gain and increased edmea in BLE. patient was found to have  acute on chronic heart failure and pressure injury of skin.  PMH: CHF, chronic low back pain, HTN, HLD, DM, PVD   Clinical Impression   Patient is a 65 year old female who was admitted with above. Patient was living at home alone prior level. Currently, patient has increased pain, decreased functional activity tolerance, decreased standing balance impacting patients ability to engage in ADLs. Patient would continue to benefit from skilled OT services at this time while admitted and after d/c to address noted deficits in order to improve overall safety and independence in ADLs.        Recommendations for follow up therapy are one component of a multi-disciplinary discharge planning process, led by the attending physician.  Recommendations may be updated based on patient status, additional functional criteria and insurance authorization.   Follow Up Recommendations  CIR    Equipment Recommendations  Other (comment) (defer to next venue)    Recommendations for Other Services       Precautions / Restrictions Precautions Precautions: Fall Restrictions Weight Bearing Restrictions: No      Mobility Bed Mobility Overal bed mobility: Needs Assistance Bed Mobility: Sit to Supine       Sit to supine: Min guard        Transfers Overall transfer level: Needs assistance Equipment used: Rolling walker (2 wheeled) Transfers: Sit to/from [facility] Sit to Stand: Min assist Stand pivot transfers: Min guard            Balance                                           ADL either performed or assessed with clinical judgement   ADL Overall ADL's :  Needs assistance/impaired Eating/Feeding: Modified independent;Sitting   Grooming: Standing;Min guard Grooming Details (indicate cue type and reason): with patient standing for 2 mins with increased fatigue noted with longer standing tasks. Upper Body Bathing: Min guard;Sitting   Lower Body Bathing: Moderate assistance;Sitting/lateral leans Lower Body Bathing Details (indicate cue type and reason): patient reported bathing in sitting at home Upper Body Dressing : Min guard;Sitting   Lower Body Dressing: Moderate assistance;Sit to/from stand   Toilet Transfer: RW;BSC;Minimal assistance;Min guard Toilet Transfer Details (indicate cue type and reason): patient required increased education for proper positionig to complete transfers and using RW for support during transfers. Toileting- Clothing Manipulation and Hygiene: Minimal assistance;Sit to/from stand       Functional mobility during ADLs: Rolling walker;Minimal assistance General ADL Comments: patient was able to transfer from recliner to 3 in 1 commode then to bed with patient able to complete sit to supine with min guard.     Vision Baseline Vision/History: 1 Wears glasses Ability to See in Adequate Light: 1 Impaired       Perception     Praxis      Pertinent Vitals/Pain Pain Assessment: 0-10 Pain Score: 5  Pain Location: back and bilateral knees. Pain Descriptors / Indicators: Other (Comment) (excrutiating per patient description) Pain Intervention(s): Limited activity within patient's tolerance;Monitored during session     Hand Dominance Right  Extremity/Trunk Assessment Upper Extremity Assessment Upper Extremity Assessment: Overall WFL for tasks assessed   Lower Extremity Assessment Lower Extremity Assessment: Defer to PT evaluation   Cervical / Trunk Assessment Cervical / Trunk Assessment: Normal   Communication Communication Communication: No difficulties   Cognition Arousal/Alertness:  Awake/alert Behavior During Therapy: WFL for tasks assessed/performed Overall Cognitive Status: Within Functional Limits for tasks assessed                                     General Comments       Exercises     Shoulder Instructions      Home Living Family/patient expects to be discharged to:: Private residence Living Arrangements: Alone   Type of Home: Apartment Home Access: Stairs to enter [employer] of Steps: 6 steps that are rather uneven, where some are normal steps and some more like a platform. from parking lot. still needs to speak to management about ramp. Entrance Stairs-Rails: Right Home Layout: One level     Bathroom Shower/Tub: Tub/shower unit;Curtain   Bathroom Toilet: Standard Bathroom Accessibility: No   Home Equipment: Cane - single point;Walker - 4 wheels;Bedside commode;Shower seat;Hand held shower head;Grab bars - tub/shower;Adaptive equipment Adaptive Equipment: Reacher;Sock aid;Long-handled shoe horn;Long-handled sponge Additional Comments: suction cup grab bar in shower. patient reported checking each time before she uses it.      Prior Functioning/Environment Level of Independence: Needs assistance  Gait / Transfers Assistance Needed: uses SPC typically for household distances and rollator for community ambulation. No falls recently. ADL's / Homemaking Assistance Needed: Does minimal cooking and needs to sit frequently. Does not drive. Uses [employer] for groceries. I for ADL tasks.            OT Problem List: Decreased strength;Impaired balance (sitting and/or standing);Decreased safety awareness      OT Treatment/Interventions: Self-care/ADL training;DME and/or AE instruction;Therapeutic activities;Balance training;Patient/family education    OT Goals(Current goals can be found in the care plan section) Acute Rehab OT Goals Patient Stated Goal: to get stronger to get back home OT Goal Formulation: With  patient Time For Goal Achievement: 10/05/21 Potential to Achieve Goals: Good  OT Frequency: Min 2X/week   Barriers to D/C: Decreased caregiver support  patient lives at home alone       Co-evaluation              AM-PAC OT "6 Clicks" Daily Activity     Outcome Measure Help from another person eating meals?: None Help from another person taking care of personal grooming?: A Little Help from another person toileting, which includes using toliet, bedpan, or urinal?: A Little Help from another person bathing (including washing, rinsing, drying)?: A Lot Help from another person to put on and taking off regular upper body clothing?: A Little Help from another person to put on and taking off regular lower body clothing?: A Lot 6 Click Score: 17   End of Session Equipment Utilized During Treatment: Gait belt;Rolling walker Nurse Communication: Mobility status  Activity Tolerance: Patient tolerated treatment well Patient left: in bed;with call bell/phone within reach  OT Visit Diagnosis: Unsteadiness on feet (R26.81);Muscle weakness (generalized) (M62.81);Pain Pain - part of body:  (bilateral knees)                Time: 2376-2831 OT Time Calculation (min): 33 min Charges:  OT General Charges $OT Visit: 1 Visit OT Evaluation $OT Eval Low Complexity: 1  Low OT Treatments $Self Care/Home Management : 8-22 mins  Jackelyn Poling OTR/L, MS Acute Rehabilitation Department Office# [phone] Pager# [phone]   [place] 09/21/2021, 12:37 PM

## 2021-09-21 NOTE — Progress Notes (Signed)
? ?  Inpatient Rehab Admissions Coordinator : ? ?Per therapy recommendations, patient was screened for CIR candidacy by Zully Damonie Ellenwood RN MSN.  At this time patient appears to be a potential candidate for CIR. I will place a rehab consult per protocol for full assessment. Please call me with any questions. ? ?Shaneequa Karis Emig RN MSN ?Admissions Coordinator ?[phone] ?  ?

## 2021-09-21 NOTE — Progress Notes (Signed)
Chaplain received a page that patient wished to talk.  Chaplain provided listening support as Maria [name] shared about some of her health and personal history, her fears for her future and her desire to live in a healthy way.  She has good support from family and friends and through her church community.  Her faith is very important to her and it is giving her comfort at this time.  Chaplain provided ministry of presence and prayers for healing.  [place], [place] Pager, [phone] 11:48 AM   09/21/21 1100  Clinical Encounter Type  Visited With Patient  Visit Type Spiritual support  Referral From Nurse  Spiritual Encounters  Spiritual Needs Prayer

## 2021-09-21 NOTE — Evaluation (Signed)
Physical Therapy Evaluation Patient Details Name: Maria [name] MRN: [id number] DOB: 1956/09/29 Today's Date: 09/21/2021  History of Present Illness  Patient is a 65 year old female who presented to the hosptial on 9/30 with weight gain and increased edmea in BLE. patient was found to have  acute on chronic heart failure and pressure injury of skin.  PMH: CHF, chronic low back pain, HTN, HLD, DM, PVD  Clinical Impression  Pt admitted with above diagnosis. Pt currently with functional limitations due to the deficits listed below (see PT Problem List). Pt will benefit from skilled PT to increase their independence and safety with mobility to allow discharge to the venue listed below.  Pt would benefit from CIR to work towards goal of returning home.      Recommendations for follow up therapy are one component of a multi-disciplinary discharge planning process, led by the attending physician.  Recommendations may be updated based on patient status, additional functional criteria and insurance authorization.  Follow Up Recommendations CIR    Equipment Recommendations  None recommended by PT    Recommendations for Other Services       Precautions / Restrictions Precautions Precautions: Fall Restrictions Weight Bearing Restrictions: No      Mobility  Bed Mobility Overal bed mobility: Needs Assistance Bed Mobility: Supine to Sit     Supine to sit: Min assist Sit to supine: Min guard   General bed mobility comments: MIN A for coordination of trunk and legs. Very anxious about having taken Lasix earlier    Transfers Overall transfer level: Needs assistance Equipment used: Rolling walker (2 wheeled) Transfers: Sit to/from Stand Sit to Stand: Min assist Stand pivot transfers: Min guard          Ambulation/Gait Ambulation/Gait assistance: Min assist Gait Distance (Feet): 14 Feet Assistive device: Rolling walker (2 wheeled) Gait Pattern/deviations: Decreased step length -  right;Decreased step length - left Gait velocity: decreased   General Gait Details: Amb in room only due to urinary frequency and concerns, but she was quite fatigued from short gait distance.There was no smoothness to cadence and would step-step rest.  Stairs            Wheelchair Mobility    Modified Rankin (Stroke Patients Only)       Balance Overall balance assessment: Needs assistance Sitting-balance support: Feet supported Sitting balance-Leahy Scale: Good       Standing balance-Leahy Scale: Fair                               Pertinent Vitals/Pain Pain Assessment: Faces Faces Pain Scale: Hurts little more Pain Location: back and knees Pain Descriptors / Indicators: Grimacing Pain Intervention(s): Monitored during session    Home Living                        Prior Function                 Hand Dominance        Extremity/Trunk Assessment   Upper Extremity Assessment Upper Extremity Assessment: Defer to OT evaluation    Lower Extremity Assessment Lower Extremity Assessment: Generalized weakness    Cervical / Trunk Assessment Cervical / Trunk Assessment: Normal  Communication      Cognition Arousal/Alertness: Awake/alert Behavior During Therapy: WFL for tasks assessed/performed Overall Cognitive Status: Within Functional Limits for tasks assessed  General Comments General comments (skin integrity, edema, etc.): B LE edema noted    Exercises     Assessment/Plan    PT Assessment Patient needs continued PT services  PT Problem List Decreased strength;Decreased activity tolerance;Decreased balance;Decreased mobility;Cardiopulmonary status limiting activity       PT Treatment Interventions DME instruction;Gait training;Functional mobility training;Therapeutic activities;Therapeutic exercise;Balance training;Stair training    PT Goals (Current goals can be  found in the Care Plan section)  Acute Rehab PT Goals Patient Stated Goal: to get back home when she is able PT Goal Formulation: With patient Time For Goal Achievement: 10/05/21 Potential to Achieve Goals: Good    Frequency Min 4X/week   Barriers to discharge Decreased caregiver support;Inaccessible home environment lives alone with 6 steps to enter    Co-evaluation               AM-PAC PT "6 Clicks" Mobility  Outcome Measure Help needed turning from your back to your side while in a flat bed without using bedrails?: A Little Help needed moving from lying on your back to sitting on the side of a flat bed without using bedrails?: A Little Help needed moving to and from a bed to a chair (including a wheelchair)?: A Little Help needed standing up from a chair using your arms (e.g., wheelchair or bedside chair)?: A Little Help needed to walk in hospital room?: A Little Help needed climbing 3-5 steps with a railing? : A Lot 6 Click Score: 17    End of Session Equipment Utilized During Treatment: Gait belt Activity Tolerance: Patient limited by fatigue Patient left: in chair;with call bell/phone within reach;with chair alarm set Nurse Communication: Mobility status PT Visit Diagnosis: Other abnormalities of gait and mobility (R26.89)    Time: 7654-6503 PT Time Calculation (min) (ACUTE ONLY): 19 min   Charges:   PT Evaluation $PT Eval Moderate Complexity: 1 Mod          Maria [name], Max  09/21/2021   Maria [name] 09/21/2021, 3:52 PM

## 2021-09-21 NOTE — Progress Notes (Signed)
PROGRESS NOTE  Maria [name]  VVO:[id number] DOB: [birth year] DOA: 09/19/2021 PCP: Biagio Borg, MD   Brief Narrative: Maria [name] is a 65 y.o. female with a history of morbid obesity, chronic HFpEF, T2DM, HTN who presented with a month of worsening weight gain, leg swelling and orthopnea despite increases in oral diuretic regimen by PCP and cardiology. In the ED she was not hypoxic, but exam consistent with overload. CXR clear, troponin negative, BNP 34. IV lasix was started with fair diuretic response and hospitalists consulted for admission.  Assessment & Plan: Active Problems:   Acute on chronic heart failure (HCC)   Pressure injury of skin  Acute on chronic HFpEF, HTN: Echocardiogram was repeated with preserved LVEF (65-70%) G2DD, dilated IVC. Has known LAD calcifications but no prior or current anginal complaints.  - SCr relatively stable, CrCl remains well above 60. Maintaining negative balance, though appears grossly overloaded, continue lasix 80mg  IV BID.  - Continue I/O, weights (170.2kg > 167.3kg). Weights PTA this year range 161 - 166kg; 162kg at DC 03/11/2021 for CHF admission.  - Continue BID K supp. - Continue metoprolol, losartan home meds  Debility: Increasing pain from swelling has caused impairment in mobility.  - OT and OT consulted, encourage OOB.   T2DM: Well-controlled with HbA1c 6.1%.  - Hold metformin - SSI  Hypernatremia:  - Monitor  Chronic pain: Widespread involving back and large LE joints. At baseline.  - Continue oxycodone per outpatient pain management.  - Continue flexeril - Follow up with pain management, given regularity of oxycodone at home, consider consolidation into long-acting formulation  GERD:  - PPI  Stage 2 sacral pressure injury POA: Offload as able. Mobilize.   Morbid obesity: Estimated body mass index is 61.38 kg/m as calculated from the following:   Height as of this encounter: 5\' 5"  (1.651 m).   Weight as of this  encounter: 167.3 kg.  DVT prophylaxis: Lovenox 90mg  q24h Code Status: Full Family Communication: None at bedside Disposition Plan:  Status is: Inpatient  Remains inpatient appropriate because:Inpatient level of care appropriate due to severity of illness  Dispo: The patient is from: Home              Anticipated d/c is to: Home              Patient currently is not medically stable to d/c.  Consultants:  None  Procedures:  None  Antimicrobials: None   Subjective: Swelling in legs slightly improved but not near baseline. Still with some orthopnea and decreased exertional capacity from prior. No chest pain reported.   Objective: Vitals:   09/20/21 2040 09/21/21 0428 09/21/21 0500 09/21/21 1457  BP: (!) 122/42 (!) 141/56  126/60  Pulse: 61 65  (!) 57  Resp: 18 20  16   Temp: 97.8 F (36.6 C) 98.2 F (36.8 C)  98.1 F (36.7 C)  TempSrc: Oral   Oral  SpO2: 96% 92%  99%  Weight:   (!) 167.3 kg   Height:        Intake/Output Summary (Last 24 hours) at 09/21/2021 1629 Last data filed at 09/21/2021 1259 Gross per 24 hour  Intake --  Output 3150 ml  Net -3150 ml   Filed Weights   09/20/21 0500 09/21/21 0500  Weight: (!) 170.2 kg (!) 167.3 kg   Gen: 65 y.o. female in no distress Pulm: Nonlabored breathing room air with diminished breath sounds, no definite crackles. CV: Regular rate and rhythm. No murmur, rub,  or gallop. UTD JVD, 2+ dependent edema. GI: Abdomen soft, non-tender, non-distended, with normoactive bowel sounds.  Ext: Warm, no deformities Skin: No new rashes, lesions or ulcers on visualized skin. Neuro: Alert and oriented. No focal neurological deficits. Psych: Judgement and insight appear fair. Mood euthymic & affect congruent. Behavior is appropriate.    Data Reviewed: I have personally reviewed following labs and imaging studies  CBC: Recent Labs  Lab 09/19/21 1228 09/20/21 0518  WBC 8.0 6.7  NEUTROABS 4.6  --   HGB 11.6* 12.0  HCT 36.1 37.0   MCV 94.0 93.4  PLT 250 102   Basic Metabolic Panel: Recent Labs  Lab 09/19/21 1228 09/20/21 0518 09/21/21 0714  NA 141 145 147*  K 3.6 3.8 4.5  CL 99 102 103  CO2 32 35* 36*  GLUCOSE 130* 138* 124*  BUN 15 12 14   CREATININE 1.07* 1.00 1.10*  CALCIUM 8.8* 8.9 9.1   GFR: Estimated Creatinine Clearance: 82.5 mL/min (A) (by C-G formula based on SCr of 1.1 mg/dL (H)). Liver Function Tests: Recent Labs  Lab 09/19/21 1228 09/20/21 0518  AST 19 17  ALT 15 14  ALKPHOS 69 62  BILITOT 1.1 1.2  PROT 7.3 7.0  ALBUMIN 3.8 3.7   No results for input(s): LIPASE, AMYLASE in the last 168 hours. No results for input(s): AMMONIA in the last 168 hours. Coagulation Profile: No results for input(s): INR, PROTIME in the last 168 hours. Cardiac Enzymes: No results for input(s): CKTOTAL, CKMB, CKMBINDEX, TROPONINI in the last 168 hours. BNP (last 3 results) No results for input(s): PROBNP in the last 8760 hours. HbA1C: Recent Labs    09/20/21 0518  HGBA1C 6.1*   CBG: Recent Labs  Lab 09/20/21 1157 09/20/21 1649 09/20/21 2039 09/21/21 0738 09/21/21 1137  GLUCAP 113* 116* 144* 110* 104*   Lipid Profile: No results for input(s): CHOL, HDL, LDLCALC, TRIG, CHOLHDL, LDLDIRECT in the last 72 hours. Thyroid Function Tests: No results for input(s): TSH, T4TOTAL, FREET4, T3FREE, THYROIDAB in the last 72 hours. Anemia Panel: No results for input(s): VITAMINB12, FOLATE, FERRITIN, TIBC, IRON, RETICCTPCT in the last 72 hours. Urine analysis:    Component Value Date/Time   COLORURINE YELLOW 09/19/2021 1400   APPEARANCEUR HAZY (A) 09/19/2021 1400   LABSPEC 1.005 09/19/2021 1400   PHURINE 7.0 09/19/2021 1400   GLUCOSEU NEGATIVE 09/19/2021 1400   GLUCOSEU NEGATIVE 07/07/2021 1621   HGBUR SMALL (A) 09/19/2021 1400   BILIRUBINUR NEGATIVE 09/19/2021 1400   KETONESUR NEGATIVE 09/19/2021 1400   PROTEINUR NEGATIVE 09/19/2021 1400   UROBILINOGEN 0.2 07/07/2021 1621   NITRITE NEGATIVE  09/19/2021 1400   LEUKOCYTESUR MODERATE (A) 09/19/2021 1400   Recent Results (from the past 240 hour(s))  Resp Panel by RT-PCR (Flu A&B, Covid) Nasopharyngeal Swab     Status: None   Collection Time: 09/19/21 12:28 PM   Specimen: Nasopharyngeal Swab; Nasopharyngeal(NP) swabs in vial transport medium  Result Value Ref Range Status   SARS Coronavirus 2 by RT PCR NEGATIVE NEGATIVE Final    Comment: (NOTE) SARS-CoV-2 target nucleic acids are NOT DETECTED.  The SARS-CoV-2 RNA is generally detectable in upper respiratory specimens during the acute phase of infection. The lowest concentration of SARS-CoV-2 viral copies this assay can detect is 138 copies/mL. A negative result does not preclude SARS-Cov-2 infection and should not be used as the sole basis for treatment or other patient management decisions. A negative result may occur with  improper specimen collection/handling, submission of specimen other than nasopharyngeal swab,  presence of viral mutation(s) within the areas targeted by this assay, and inadequate number of viral copies(<138 copies/mL). A negative result must be combined with clinical observations, patient history, and epidemiological information. The expected result is Negative.  Fact Sheet for Patients:  EntrepreneurPulse.com.au  Fact Sheet for Healthcare Providers:  IncredibleEmployment.be  This test is no t yet approved or cleared by the [place] FDA and  has been authorized for detection and/or diagnosis of SARS-CoV-2 by FDA under an Emergency Use Authorization (EUA). This EUA will remain  in effect (meaning this test can be used) for the duration of the COVID-19 declaration under Section 564(b)(1) of the Act, 21 U.S.C.section 360bbb-3(b)(1), unless the authorization is terminated  or revoked sooner.       Influenza A by PCR NEGATIVE NEGATIVE Final   Influenza B by PCR NEGATIVE NEGATIVE Final    Comment: (NOTE) The  Xpert Xpress SARS-CoV-2/FLU/RSV plus assay is intended as an aid in the diagnosis of influenza from Nasopharyngeal swab specimens and should not be used as a sole basis for treatment. Nasal washings and aspirates are unacceptable for Xpert Xpress SARS-CoV-2/FLU/RSV testing.  Fact Sheet for Patients: EntrepreneurPulse.com.au  Fact Sheet for Healthcare Providers: IncredibleEmployment.be  This test is not yet approved or cleared by the [place] FDA and has been authorized for detection and/or diagnosis of SARS-CoV-2 by FDA under an Emergency Use Authorization (EUA). This EUA will remain in effect (meaning this test can be used) for the duration of the COVID-19 declaration under Section 564(b)(1) of the Act, 21 U.S.C. section 360bbb-3(b)(1), unless the authorization is terminated or revoked.  Performed at [facility], [place] [address], [place], [place] [address]       Radiology Studies: ECHOCARDIOGRAM COMPLETE  Result Date: 09/20/2021    ECHOCARDIOGRAM REPORT   Patient Name:   Maria [name] Date of Exam: 09/20/2021 Medical Rec #:  [id number]       Height:       65.0 in Accession #:    [id number]      Weight:       375.2 lb Date of Birth:  09/12/1956       BSA:          2.585 m Patient Age:    60 years        BP:           139/51 mmHg Patient Gender: F               HR:           64 bpm. Exam Location:  Inpatient Procedure: 2D Echo, Color Doppler and Cardiac Doppler Indications:    X93.71 Acute diastolic (congestive) heart failure  History:        Patient has prior history of Echocardiogram examinations, most                 recent 03/08/2021. CHF; Risk Factors:Hypertension, Diabetes,                 Dyslipidemia and Sleep Apnea.  Sonographer:    Raquel Sarna Senior RDCS Referring Phys: [id number] Jonnie Finner  Sonographer Comments: Suboptimal apical window due to body habitus. IMPRESSIONS  1. Left ventricular ejection fraction, by estimation, is  65 to 70%. The left ventricle has normal function. The left ventricle has no regional wall motion abnormalities. Left ventricular diastolic parameters are consistent with Grade II diastolic dysfunction (pseudonormalization). Elevated left ventricular end-diastolic pressure.  2. Right ventricular systolic function is normal.  The right ventricular size is normal. Tricuspid regurgitation signal is inadequate for assessing PA pressure.  3. Left atrial size was upper normal.  4. The mitral valve is grossly normal. No evidence of mitral valve regurgitation.  5. The aortic valve is tricuspid. Aortic valve regurgitation is not visualized.  6. The inferior vena cava is dilated in size with >50% respiratory variability, suggesting right atrial pressure of 8 mmHg. Comparison(s): Prior images reviewed side by side. LVEF vigorous with probable increased LVEDP. FINDINGS  Left Ventricle: Left ventricular ejection fraction, by estimation, is 65 to 70%. The left ventricle has normal function. The left ventricle has no regional wall motion abnormalities. The left ventricular internal cavity size was normal in size. There is  no left ventricular hypertrophy. Left ventricular diastolic parameters are consistent with Grade II diastolic dysfunction (pseudonormalization). Elevated left ventricular end-diastolic pressure. Right Ventricle: The right ventricular size is normal. No increase in right ventricular wall thickness. Right ventricular systolic function is normal. Tricuspid regurgitation signal is inadequate for assessing PA pressure. Left Atrium: Left atrial size was upper normal. Right Atrium: Right atrial size was normal in size. Pericardium: There is no evidence of pericardial effusion. Presence of pericardial fat pad. Mitral Valve: The mitral valve is grossly normal. Mild mitral annular calcification. No evidence of mitral valve regurgitation. Tricuspid Valve: The tricuspid valve is grossly normal. Tricuspid valve regurgitation  is mild. Aortic Valve: The aortic valve is tricuspid. There is mild aortic valve annular calcification. Aortic valve regurgitation is not visualized. Pulmonic Valve: The pulmonic valve was grossly normal. Pulmonic valve regurgitation is not visualized. Aorta: The aortic root is normal in size and structure. Venous: The inferior vena cava is dilated in size with greater than 50% respiratory variability, suggesting right atrial pressure of 8 mmHg. IAS/Shunts: No atrial level shunt detected by color flow Doppler.  LEFT VENTRICLE PLAX 2D LVIDd:         5.70 cm  Diastology LVIDs:         3.10 cm  LV e' medial:    5.44 cm/s LV PW:         1.00 cm  LV E/e' medial:  20.4 LV IVS:        0.80 cm  LV e' lateral:   6.31 cm/s LVOT diam:     1.90 cm  LV E/e' lateral: 17.6 LV SV:         89 LV SV Index:   35 LVOT Area:     2.84 cm  RIGHT VENTRICLE RV S prime:     14.40 cm/s TAPSE (M-mode): 2.8 cm LEFT ATRIUM             Index       RIGHT ATRIUM           Index LA diam:        4.10 cm 1.59 cm/m  RA Area:     22.60 cm LA Vol (A2C):   84.2 ml 32.58 ml/m RA Volume:   62.00 ml  23.99 ml/m LA Vol (A4C):   61.6 ml 23.83 ml/m LA Biplane Vol: 78.1 ml 30.22 ml/m  AORTIC VALVE LVOT Vmax:   130.00 cm/s LVOT Vmean:  98.500 cm/s LVOT VTI:    0.315 m  AORTA Ao Root diam: 3.70 cm Ao Asc diam:  3.50 cm MITRAL VALVE MV Area (PHT): 2.37 cm     SHUNTS MV Decel Time: 320 msec     Systemic VTI:  0.32 m MV E velocity: 111.00 cm/s  Systemic Diam: 1.90 cm MV A velocity: 121.00 cm/s MV E/A ratio:  0.92 Rozann Lesches MD Electronically signed by Rozann Lesches MD Signature Date/Time: 09/20/2021/2:36:01 PM    Final     Scheduled Meds:  enoxaparin (LOVENOX) injection  90 mg Subcutaneous Daily   furosemide  80 mg Intravenous BID   insulin aspart  0-15 Units Subcutaneous TID WC   losartan  50 mg Oral Daily   metoprolol tartrate  25 mg Oral BID   pantoprazole  40 mg Oral Daily   potassium chloride SA  20 mEq Oral BID   rOPINIRole  1 mg Oral  TID   Continuous Infusions:   LOS: 2 days   Time spent: 35 minutes.  Patrecia Pour, MD [facility] [email or web address] 09/21/2021, 4:29 PM

## 2021-09-22 ENCOUNTER — Telehealth: Payer: Medicare Other | Admitting: Internal Medicine

## 2021-09-22 DIAGNOSIS — F32A Depression, unspecified: Secondary | ICD-10-CM

## 2021-09-22 LAB — BASIC METABOLIC PANEL
Anion gap: 5 (ref 5–15)
BUN: 15 mg/dL (ref 8–23)
CO2: 33 mmol/L — ABNORMAL HIGH (ref 22–32)
Calcium: 8.3 mg/dL — ABNORMAL LOW (ref 8.9–10.3)
Chloride: 102 mmol/L (ref 98–111)
Creatinine, Ser: 0.98 mg/dL (ref 0.44–1.00)
GFR, Estimated: >60 mL/min (ref >60)
Glucose, Bld: 130 mg/dL — ABNORMAL HIGH (ref 70–99)
Potassium: 3.6 mmol/L (ref 3.5–5.1)
Sodium: 140 mmol/L (ref 135–145)

## 2021-09-22 LAB — GLUCOSE, CAPILLARY
Glucose-Capillary: 94 mg/dL (ref 70–99)
Glucose-Capillary: 109 mg/dL — ABNORMAL HIGH (ref 70–99)
Glucose-Capillary: 114 mg/dL — ABNORMAL HIGH (ref 70–99)
Glucose-Capillary: 123 mg/dL — ABNORMAL HIGH (ref 70–99)

## 2021-09-22 MED ORDER — CEPHALEXIN 500 MG PO CAPS
500.0000 mg | ORAL_CAPSULE | Freq: Four times a day (QID) | ORAL | Status: DC
  Administered 2021-09-22 – 2021-09-25 (×11): 500 mg via ORAL
  Filled 2021-09-22 (×12): qty 1

## 2021-09-22 MED ORDER — FUROSEMIDE 10 MG/ML IJ SOLN
80.0000 mg | Freq: Two times a day (BID) | INTRAMUSCULAR | Status: DC
  Administered 2021-09-22 – 2021-09-25 (×6): 80 mg via INTRAVENOUS
  Filled 2021-09-22 (×6): qty 8

## 2021-09-22 MED ORDER — METOPROLOL TARTRATE 25 MG PO TABS
12.5000 mg | ORAL_TABLET | Freq: Two times a day (BID) | ORAL | Status: DC
  Administered 2021-09-22 – 2021-09-25 (×6): 12.5 mg via ORAL
  Filled 2021-09-22 (×6): qty 1

## 2021-09-22 MED ORDER — ENOXAPARIN SODIUM 80 MG/0.8ML IJ SOSY
80.0000 mg | PREFILLED_SYRINGE | Freq: Every day | INTRAMUSCULAR | Status: DC
  Administered 2021-09-23 – 2021-09-25 (×3): 80 mg via SUBCUTANEOUS
  Filled 2021-09-22 (×3): qty 0.8

## 2021-09-22 NOTE — Progress Notes (Signed)
Inpatient Rehabilitation Admissions Coordinator   Inpatient rehab consult received. I spoke with patient by phone to discuss goals and expectations of a possible CIR admit. She prefers CIR rather than SNF. I will begin [facility] Medicare Auth for possible admit pending insurance approval and bed availability this week.  Danne Baxter, RN, MSN Rehab Admissions Coordinator [phone] 09/22/2021 1:08 PM

## 2021-09-22 NOTE — Consult Note (Signed)
[facility] CM Inpatient Consult   09/22/2021  Maria [name] [birth year] [id number]  [facility]   Patient was screened for [place] Management services due to high unplanned readmission risk score. This patient's primary provider office offers embedded chronic care management team.  According to encounter review, patient has been active in the chronic care management program with PCP office.   Plan: Notification sent to the [place] Management and made aware of current disposition. Will continue to follow for progression.  Of note, [facility] Care Management services does not replace or interfere with any services that are arranged by inpatient case management or social work.   Netta Cedars, MSN, [facility] Liaison Nurse Mobile Phone [phone]  Toll free office [phone]

## 2021-09-22 NOTE — Progress Notes (Signed)
Physical Therapy Treatment Patient Details Name: Maria [name] MRN: [id number] DOB: 1956/04/08 Today's Date: 09/22/2021   History of Present Illness Patient is a 65 year old female who presented to the hosptial on 9/30 with weight gain and increased edmea in BLE. patient was found to have  acute on chronic heart failure and pressure injury of skin.  PMH: CHF, chronic low back pain, HTN, HLD, DM, PVD    PT Comments    Today's skilled session continued to focus on mobility progression and LE strengthening. No issues noted or reported in session. Acute PT to continue during pt's hospital stay.    Recommendations for follow up therapy are one component of a multi-disciplinary discharge planning process, led by the attending physician.  Recommendations may be updated based on patient status, additional functional criteria and insurance authorization.  Follow Up Recommendations  CIR     Equipment Recommendations  None recommended by PT    Precautions / Restrictions Precautions Precautions: Fall Restrictions Weight Bearing Restrictions: No     Mobility  Bed Mobility Overal bed mobility: Needs Assistance Bed Mobility: Supine to Sit;Sit to Supine     Supine to sit: Min guard Sit to supine: Min guard   General bed mobility comments: increased time with HOB 30 degrees with supine to sit, then HOB flat for sit to supine. no physical assistance needed    Transfers Overall transfer level: Needs assistance Equipment used: Rolling walker (2 wheeled) Transfers: Sit to/from Stand Sit to Stand: Min assist         General transfer comment: cues for hand placement and weight shifting  Ambulation/Gait Ambulation/Gait assistance: Min guard Gait Distance (Feet):  (6 side steps along edge of bed with RW) Assistive device: Rolling walker (2 wheeled)       General Gait Details: deferred as she just walked to bathroom and back to bed with nursing. did take steps at edge of bed with min  guard assist        Cognition Arousal/Alertness: Awake/alert Behavior During Therapy: [facility] for tasks assessed/performed Overall Cognitive Status: Within Functional Limits for tasks assessed                    Exercises General Exercises - Lower Extremity Long Arc Quad: AROM;Strengthening;Both;10 reps;Seated Toe Raises: AROM;Strengthening;Both;10 reps Heel Raises: AROM;Strengthening;Both;10 reps;Seated;Standing;Limitations Heel Raises Limitations: seated at edge of bed, then in standing at RW Mini-Sqauts: AROM;Strengthening;Both;10 reps;Standing     Pertinent Vitals/Pain Pain Assessment: 0-10 Pain Score: 4  Pain Location: back and knees Pain Descriptors / Indicators: Aching;Sore Pain Intervention(s): Limited activity within patient's tolerance;Monitored during session;Premedicated before session;Repositioned     PT Goals (current goals can now be found in the care plan section) Acute Rehab PT Goals Patient Stated Goal: to get back home when she is able Time For Goal Achievement: 10/05/21 Potential to Achieve Goals: Good Progress towards PT goals: Progressing toward goals    Frequency    Min 4X/week      PT Plan Current plan remains appropriate    AM-PAC PT "6 Clicks" Mobility   Outcome Measure  Help needed turning from your back to your side while in a flat bed without using bedrails?: A Little Help needed moving from lying on your back to sitting on the side of a flat bed without using bedrails?: A Little Help needed moving to and from a bed to a chair (including a wheelchair)?: A Little Help needed standing up from a chair using your arms (e.g.,  wheelchair or bedside chair)?: A Little Help needed to walk in hospital room?: A Little Help needed climbing 3-5 steps with a railing? : A Lot 6 Click Score: 17    End of Session Equipment Utilized During Treatment: Gait belt Activity Tolerance: Patient tolerated treatment well;No increased pain;Patient  limited by fatigue Patient left: in bed;with call bell/phone within reach;with bed alarm set Nurse Communication: Mobility status PT Visit Diagnosis: Other abnormalities of gait and mobility (R26.89)     Time: 1657-1710 PT Time Calculation (min) (ACUTE ONLY): 13 min  Charges:  $Therapeutic Exercise: 8-22 mins                    Willow Ora, PTA, [facility] Acute Rehab Services Office- [phone] 09/22/21, 5:19 PM   Willow Ora 09/22/2021, 5:17 PM

## 2021-09-22 NOTE — Progress Notes (Signed)
PROGRESS NOTE  Maria [name]  RAQ:[id number] DOB: 1956-11-08 DOA: 09/19/2021 PCP: Biagio Borg, MD   Brief Narrative: Maria [name] is a 65 y.o. female with a history of morbid obesity, chronic HFpEF, T2DM, HTN who presented with a month of worsening weight gain, leg swelling and orthopnea despite increases in oral diuretic regimen by PCP and cardiology. In the ED she was not hypoxic, but exam consistent with overload. CXR clear, troponin negative, BNP 34. IV lasix was started with fair diuretic response and hospitalists consulted for admission.  Assessment & Plan: Active Problems:   Acute on chronic heart failure (HCC)   Pressure injury of skin  Acute on chronic HFpEF, HTN: Echocardiogram was repeated with preserved LVEF (65-70%) G2DD, dilated IVC. Has known LAD calcifications but no prior or current anginal complaints.  - Continue lasix 80mg  IV BID.  - Continue I/O, weights (170.2kg > 167kg). Weights PTA this year range 161 - 166kg; 162kg at DC 03/11/2021 for CHF admission.  - Continue BID K supp. - Continue metoprolol, decrease dose to 12.5mg  po BID and hold losartan home med due to soft BPs.  Debility: Increasing pain from swelling has caused impairment in mobility.  - OT and OT consulted, encourage OOB. Will pursue CIR for disposition.  T2DM: Well-controlled with HbA1c 6.1%.  - Hold metformin - SSI  Hypernatremia: Resolved.  Chronic pain: Widespread involving back and large LE joints. At baseline.  - Continue oxycodone per outpatient pain management.  - Continue flexeril - Follow up with pain management, given regularity of oxycodone at home, consider consolidation into long-acting formulation  GERD:  - PPI  Left lower leg cellulitis: Likely caught early without SIRS or exudate. Edema puts at risk of delayed healing.  - Start keflex. Has tolerated this before despite vomiting allergy to augmentin.   Stage 2 sacral pressure injury POA: Offload as able. Mobilize.    Morbid obesity: Estimated body mass index is 61.27 kg/m as calculated from the following:   Height as of this encounter: 5\' 5"  (1.651 m).   Weight as of this encounter: 167 kg.  DVT prophylaxis: Lovenox 90mg  q24h Code Status: Full Family Communication: None at bedside Disposition Plan:  Status is: Inpatient  Remains inpatient appropriate because:Inpatient level of care appropriate due to severity of illness  Dispo: The patient is from: Home              Anticipated d/c is to: Home              Patient currently is not medically stable to d/c.  Consultants:  None  Procedures:  None  Antimicrobials: None   Subjective: Frustrated as he swelling is only somewhat better, though she's making a lot of urine (charted as 3.3L over past 24 hours). Increasing pain to left lower left/ankle, no fever. No dyspnea or chest pain. Chronic pain is severe.  Objective: Vitals:   09/21/21 2020 09/22/21 0432 09/22/21 0500 09/22/21 1505  BP: (!) 126/51 (!) 138/57  (!) 119/42  Pulse: (!) 57 (!) 51  (!) 53  Resp: 20 20  18   Temp: 98.4 F (36.9 C) 98 F (36.7 C)  98.1 F (36.7 C)  TempSrc: Oral Oral  Oral  SpO2: 92% 94%  97%  Weight:   (!) 167 kg   Height:        Intake/Output Summary (Last 24 hours) at 09/22/2021 1647 Last data filed at 09/22/2021 1200 Gross per 24 hour  Intake 840 ml  Output 3800 ml  Net -2960 ml   Filed Weights   09/20/21 0500 09/21/21 0500 09/22/21 0500  Weight: (!) 170.2 kg (!) 167.3 kg (!) 167 kg   Gen: 65 y.o. female in no distress Pulm: Nonlabored breathing room air. Clear. CV: Regular rate and rhythm. No murmur, rub, or gallop. UTD JVD, roughly stable dependent edema. GI: Abdomen soft, non-tender, non-distended, with normoactive bowel sounds.  Ext: Warm, no deformities Skin: Increasing erythema to L > R lower legs with induration dependently on left above ankle. No fluctuance. Blanchable. No other rashes, lesions or ulcers on visualized skin. Neuro:  Alert and oriented. No focal neurological deficits. Psych: Judgement and insight appear fair. Mood euthymic & affect congruent. Behavior is appropriate.    Data Reviewed: I have personally reviewed following labs and imaging studies  CBC: Recent Labs  Lab 09/19/21 1228 09/20/21 0518  WBC 8.0 6.7  NEUTROABS 4.6  --   HGB 11.6* 12.0  HCT 36.1 37.0  MCV 94.0 93.4  PLT 250 132   Basic Metabolic Panel: Recent Labs  Lab 09/19/21 1228 09/20/21 0518 09/21/21 0714 09/22/21 0434  NA 141 145 147* 140  K 3.6 3.8 4.5 3.6  CL 99 102 103 102  CO2 32 35* 36* 33*  GLUCOSE 130* 138* 124* 130*  BUN 15 12 14 15   CREATININE 1.07* 1.00 1.10* 0.98  CALCIUM 8.8* 8.9 9.1 8.3*   GFR: Estimated Creatinine Clearance: 92.5 mL/min (by C-G formula based on SCr of 0.98 mg/dL). Liver Function Tests: Recent Labs  Lab 09/19/21 1228 09/20/21 0518  AST 19 17  ALT 15 14  ALKPHOS 69 62  BILITOT 1.1 1.2  PROT 7.3 7.0  ALBUMIN 3.8 3.7   No results for input(s): LIPASE, AMYLASE in the last 168 hours. No results for input(s): AMMONIA in the last 168 hours. Coagulation Profile: No results for input(s): INR, PROTIME in the last 168 hours. Cardiac Enzymes: No results for input(s): CKTOTAL, CKMB, CKMBINDEX, TROPONINI in the last 168 hours. BNP (last 3 results) No results for input(s): PROBNP in the last 8760 hours. HbA1C: Recent Labs    09/20/21 0518  HGBA1C 6.1*   CBG: Recent Labs  Lab 09/21/21 1745 09/21/21 2224 09/22/21 0741 09/22/21 1142 09/22/21 1625  GLUCAP 126* 150* 94 109* 114*   Lipid Profile: No results for input(s): CHOL, HDL, LDLCALC, TRIG, CHOLHDL, LDLDIRECT in the last 72 hours. Thyroid Function Tests: No results for input(s): TSH, T4TOTAL, FREET4, T3FREE, THYROIDAB in the last 72 hours. Anemia Panel: No results for input(s): VITAMINB12, FOLATE, FERRITIN, TIBC, IRON, RETICCTPCT in the last 72 hours. Urine analysis:    Component Value Date/Time   COLORURINE YELLOW  09/19/2021 1400   APPEARANCEUR HAZY (A) 09/19/2021 1400   LABSPEC 1.005 09/19/2021 1400   PHURINE 7.0 09/19/2021 1400   GLUCOSEU NEGATIVE 09/19/2021 1400   GLUCOSEU NEGATIVE 07/07/2021 1621   HGBUR SMALL (A) 09/19/2021 1400   BILIRUBINUR NEGATIVE 09/19/2021 1400   KETONESUR NEGATIVE 09/19/2021 1400   PROTEINUR NEGATIVE 09/19/2021 1400   UROBILINOGEN 0.2 07/07/2021 1621   NITRITE NEGATIVE 09/19/2021 1400   LEUKOCYTESUR MODERATE (A) 09/19/2021 1400   Recent Results (from the past 240 hour(s))  Resp Panel by RT-PCR (Flu A&B, Covid) Nasopharyngeal Swab     Status: None   Collection Time: 09/19/21 12:28 PM   Specimen: Nasopharyngeal Swab; Nasopharyngeal(NP) swabs in vial transport medium  Result Value Ref Range Status   SARS Coronavirus 2 by RT PCR NEGATIVE NEGATIVE Final    Comment: (NOTE) SARS-CoV-2 target nucleic  acids are NOT DETECTED.  The SARS-CoV-2 RNA is generally detectable in upper respiratory specimens during the acute phase of infection. The lowest concentration of SARS-CoV-2 viral copies this assay can detect is 138 copies/mL. A negative result does not preclude SARS-Cov-2 infection and should not be used as the sole basis for treatment or other patient management decisions. A negative result may occur with  improper specimen collection/handling, submission of specimen other than nasopharyngeal swab, presence of viral mutation(s) within the areas targeted by this assay, and inadequate number of viral copies(<138 copies/mL). A negative result must be combined with clinical observations, patient history, and epidemiological information. The expected result is Negative.  Fact Sheet for Patients:  EntrepreneurPulse.com.au  Fact Sheet for Healthcare Providers:  IncredibleEmployment.be  This test is no t yet approved or cleared by the [place] FDA and  has been authorized for detection and/or diagnosis of SARS-CoV-2 by FDA under an  Emergency Use Authorization (EUA). This EUA will remain  in effect (meaning this test can be used) for the duration of the COVID-19 declaration under Section 564(b)(1) of the Act, 21 U.S.C.section 360bbb-3(b)(1), unless the authorization is terminated  or revoked sooner.       Influenza A by PCR NEGATIVE NEGATIVE Final   Influenza B by PCR NEGATIVE NEGATIVE Final    Comment: (NOTE) The Xpert Xpress SARS-CoV-2/FLU/RSV plus assay is intended as an aid in the diagnosis of influenza from Nasopharyngeal swab specimens and should not be used as a sole basis for treatment. Nasal washings and aspirates are unacceptable for Xpert Xpress SARS-CoV-2/FLU/RSV testing.  Fact Sheet for Patients: EntrepreneurPulse.com.au  Fact Sheet for Healthcare Providers: IncredibleEmployment.be  This test is not yet approved or cleared by the [place] FDA and has been authorized for detection and/or diagnosis of SARS-CoV-2 by FDA under an Emergency Use Authorization (EUA). This EUA will remain in effect (meaning this test can be used) for the duration of the COVID-19 declaration under Section 564(b)(1) of the Act, 21 U.S.C. section 360bbb-3(b)(1), unless the authorization is terminated or revoked.  Performed at [facility], [place] [address], [place], [place] [address]       Radiology Studies: No results found.  Scheduled Meds:  [START ON 09/23/2021] enoxaparin (LOVENOX) injection  80 mg Subcutaneous Daily   furosemide  80 mg Intravenous BID   insulin aspart  0-15 Units Subcutaneous TID WC   losartan  50 mg Oral Daily   metoprolol tartrate  25 mg Oral BID   pantoprazole  40 mg Oral Daily   potassium chloride SA  20 mEq Oral BID   rOPINIRole  1 mg Oral TID   Continuous Infusions:   LOS: 3 days   Time spent: 35 minutes.  Patrecia Pour, MD [facility] [email or web address] 09/22/2021, 4:47 PM

## 2021-09-22 NOTE — TOC Progression Note (Signed)
Transition of Care [facility]) - Progression Note    Patient Details  Name: Maria [name] MRN: [id number] Date of Birth: Apr 12, 1956  Transition of Care [facility]) CM/SW Contact  Othello Dickenson, Juliann Pulse, RN Phone Number: 09/22/2021, 11:39 AM  Clinical Narrative: Noted CIR coordinator following-await if accepted.      Expected Discharge Plan: IP Rehab Facility Barriers to Discharge: Continued Medical Work up  Expected Discharge Plan and Services Expected Discharge Plan: [place]                                               Social Determinants of Health (SDOH) Interventions    Readmission Risk Interventions Readmission Risk Prevention Plan 04/11/2021  Transportation Screening Complete  PCP or Specialist Appt within 3-5 Days Complete  HRI or [place] Complete  Social Work Consult for [place] Planning/Counseling Complete  Palliative Care Screening Not Applicable  Medication Review Press photographer) Complete  Some recent data might be hidden

## 2021-09-22 NOTE — Progress Notes (Signed)
Patient ID: Maria [name], female   DOB: 1956-08-23, 65 y.o.   MRN: [id number]  Spoke to pt  Pt accidently on my outpatient schedule today after missed appt recenty, but pt is currently inpatient status  Hopefully pt will continue to do well with diuresis and rehab, and will see at next visit

## 2021-09-22 NOTE — Plan of Care (Signed)
  Problem: Clinical Measurements: Goal: Respiratory complications will improve Outcome: Progressing Goal: Cardiovascular complication will be avoided Outcome: Progressing   

## 2021-09-23 ENCOUNTER — Other Ambulatory Visit: Payer: Self-pay | Admitting: Internal Medicine

## 2021-09-23 ENCOUNTER — Other Ambulatory Visit: Payer: Self-pay | Admitting: Cardiology

## 2021-09-23 LAB — GLUCOSE, CAPILLARY
Glucose-Capillary: 100 mg/dL — ABNORMAL HIGH (ref 70–99)
Glucose-Capillary: 117 mg/dL — ABNORMAL HIGH (ref 70–99)
Glucose-Capillary: 131 mg/dL — ABNORMAL HIGH (ref 70–99)
Glucose-Capillary: 125 mg/dL — ABNORMAL HIGH (ref 70–99)

## 2021-09-23 LAB — BASIC METABOLIC PANEL
Anion gap: 5 (ref 5–15)
BUN: 17 mg/dL (ref 8–23)
CO2: 32 mmol/L (ref 22–32)
Calcium: 8.8 mg/dL — ABNORMAL LOW (ref 8.9–10.3)
Chloride: 101 mmol/L (ref 98–111)
Creatinine, Ser: 1.07 mg/dL — ABNORMAL HIGH (ref 0.44–1.00)
GFR, Estimated: 58 mL/min — ABNORMAL LOW (ref >60)
Glucose, Bld: 117 mg/dL — ABNORMAL HIGH (ref 70–99)
Potassium: 4.1 mmol/L (ref 3.5–5.1)
Sodium: 138 mmol/L (ref 135–145)

## 2021-09-23 NOTE — Progress Notes (Signed)
Occupational Therapy Treatment Patient Details Name: Maria [name] MRN: [id number] DOB: Feb 16, 1956 Today's Date: 09/23/2021   History of present illness Patient is a 65 year old female who presented to the hosptial on 9/30 with weight gain and increased edmea in BLE. patient was found to have  acute on chronic heart failure and pressure injury of skin.  PMH: CHF, chronic low back pain, HTN, HLD, DM, PVD   OT comments  Patient was noted to have improved confidence in self. Patient was able to participate in increased standing tolerance times on this date with RW and education on proper hand placement. Patient's discharge plan remains appropriate at this time. OT will continue to follow acutely.     Recommendations for follow up therapy are one component of a multi-disciplinary discharge planning process, led by the attending physician.  Recommendations may be updated based on patient status, additional functional criteria and insurance authorization.    Follow Up Recommendations  CIR    Equipment Recommendations  Other (comment)    Recommendations for Other Services      Precautions / Restrictions Precautions Precautions: Fall Restrictions Weight Bearing Restrictions: No       Mobility Bed Mobility Overal bed mobility: Needs Assistance Bed Mobility: Sidelying to Sit     Supine to sit: Min guard;HOB elevated          Transfers Overall transfer level: Needs assistance Equipment used: Rolling walker (2 wheeled) Transfers: Sit to/from Stand Sit to Stand: Min guard              Balance Overall balance assessment: Needs assistance Sitting-balance support: Feet supported Sitting balance-Leahy Scale: Good       Standing balance-Leahy Scale: Fair                             ADL either performed or assessed with clinical judgement   ADL Overall ADL's : Needs assistance/impaired     Grooming: Standing;Min guard;Wash/dry face;Oral care;Wash/dry  hands   Upper Body Bathing: Standing;Min guard   Lower Body Bathing: Minimal assistance;Sit to/from stand;Sitting/lateral leans   Upper Body Dressing : Min guard;Sitting       Toilet Transfer: RW;Minimal assistance;Min guard;Ambulation;Regular Toilet;Grab bars   Toileting- Clothing Manipulation and Hygiene: Min guard;Sit to/from stand       Functional mobility during ADLs: Min guard;Rolling walker General ADL Comments: patient was able to complete functional mobility to and from commode on this date with increased time. patient noted to have SOB but O2 saturation within normal limits.     Vision Baseline Vision/History: 1 Wears glasses Ability to See in Adequate Light: 1 Impaired     Perception     Praxis      Cognition Arousal/Alertness: Awake/alert Behavior During Therapy: WFL for tasks assessed/performed Overall Cognitive Status: Within Functional Limits for tasks assessed                                          Exercises     Shoulder Instructions       General Comments      Pertinent Vitals/ Pain       Faces Pain Scale: Hurts a little bit Pain Location: back and knees Pain Descriptors / Indicators: Aching;Sore  Home Living  Prior Functioning/Environment              Frequency  Min 2X/week        Progress Toward Goals  OT Goals(current goals can now be found in the care plan section)  Progress towards OT goals: Progressing toward goals  Acute Rehab OT Goals Patient Stated Goal: to get back home when she is able  Plan      Co-evaluation                 AM-PAC OT "6 Clicks" Daily Activity     Outcome Measure   Help from another person eating meals?: None Help from another person taking care of personal grooming?: A Little Help from another person toileting, which includes using toliet, bedpan, or urinal?: A Little Help from another person bathing  (including washing, rinsing, drying)?: A Lot Help from another person to put on and taking off regular upper body clothing?: A Little Help from another person to put on and taking off regular lower body clothing?: A Lot 6 Click Score: 17    End of Session Equipment Utilized During Treatment: Gait belt;Rolling walker  OT Visit Diagnosis: Unsteadiness on feet (R26.81);Muscle weakness (generalized) (M62.81);Pain   Activity Tolerance Patient tolerated treatment well   Patient Left in bed;with call bell/phone within reach   Nurse Communication Mobility status        Time: 0902-0926 OT Time Calculation (min): 24 min  Charges: OT General Charges $OT Visit: 1 Visit OT Treatments $Self Care/Home Management : 23-37 mins  Jackelyn Poling OTR/L, MS Acute Rehabilitation Department Office# [phone] Pager# [phone]   [place] 09/23/2021, 10:13 AM

## 2021-09-23 NOTE — Telephone Encounter (Signed)
Please refill as per office routine med refill policy (all routine meds to be refilled for 3 mo or monthly (per pt preference) up to one year from last visit, then month to month grace period for 3 mo, then further med refills will have to be denied) ? ?

## 2021-09-23 NOTE — Progress Notes (Signed)
PROGRESS NOTE  Maria [name]  CBS:[id number] DOB: 1956/02/12 DOA: 09/19/2021 PCP: Biagio Borg, MD   Brief Narrative: Maria [name] is a 65 y.o. female with a history of morbid obesity, chronic HFpEF, T2DM, HTN who presented with a month of worsening weight gain, leg swelling and orthopnea despite increases in oral diuretic regimen by PCP and cardiology. In the ED she was not hypoxic, but exam consistent with overload. CXR clear, troponin negative, BNP 34. IV lasix was started with fair diuretic response and hospitalists consulted for admission.  Assessment & Plan: Active Problems:   Acute on chronic heart failure (HCC)   Pressure injury of skin  Acute on chronic HFpEF, HTN: Echocardiogram was repeated with preserved LVEF (65-70%) G2DD, dilated IVC. Has known LAD calcifications but no prior or current anginal complaints.  - Continue lasix 80mg  IV BID.  - Continue I/O (another >3L UOP over last 24 hrs), weights (170.2kg > 164.3kg). Weights PTA this year range 161 - 166kg; 162kg at DC 03/11/2021 for CHF admission.  - Continue BID K supp. - Continue metoprolol, decrease dose to 12.5mg  po BID and hold losartan home med due to soft BPs.  Debility: Increasing pain from swelling has caused impairment in mobility.  - OT and OT consulted, encourage OOB. Will pursue CIR for disposition. She's not stable for discharge at this time.  T2DM: Well-controlled with HbA1c 6.1%.  - Hold metformin - SSI  Hypernatremia: Resolved.  Chronic pain: Widespread involving back and large LE joints. At baseline.  - Continue oxycodone per outpatient pain management.  - Continue flexeril - Follow up with pain management, given regularity of oxycodone at home, consider consolidation into long-acting formulation  GERD:  - PPI  Left lower leg cellulitis: Likely caught early without SIRS or exudate. Edema puts at risk of delayed healing.  - Started keflex 10/3. Has tolerated this before despite vomiting allergy  to augmentin.   Stage 2 sacral pressure injury POA: Offload as able. Mobilize.   Morbid obesity: Estimated body mass index is 60.28 kg/m as calculated from the following:   Height as of this encounter: 5\' 5"  (1.651 m).   Weight as of this encounter: 164.3 kg.  DVT prophylaxis: Lovenox 90mg  q24h Code Status: Full Family Communication: None at bedside Disposition Plan:  Status is: Inpatient  Remains inpatient appropriate because:Inpatient level of care appropriate due to severity of illness  Dispo: The patient is from: Home              Anticipated d/c is to: Unclear              Patient currently is not medically stable to d/c.  Consultants:  None  Procedures:  None  Antimicrobials: None   Subjective: Swelling improved, working with PT. No chest pain or fever reported. Redness in lower legs is stable.   Objective: Vitals:   09/22/21 1505 09/22/21 2153 09/23/21 0400 09/23/21 0600  BP: (!) 119/42 (!) 125/47 137/66   Pulse: (!) 53 (!) 58 60   Resp: 18 19 20    Temp: 98.1 F (36.7 C) 98.2 F (36.8 C) 97.9 F (36.6 C)   TempSrc: Oral  Oral   SpO2: 97% 97% 95%   Weight:    (!) 164.3 kg  Height:        Intake/Output Summary (Last 24 hours) at 09/23/2021 1248 Last data filed at 09/23/2021 1235 Gross per 24 hour  Intake 1140 ml  Output 2150 ml  Net -1010 ml   Filed  Weights   09/21/21 0500 09/22/21 0500 09/23/21 0600  Weight: (!) 167.3 kg (!) 167 kg (!) 164.3 kg   Gen: 65 y.o. female in no distress Pulm: Nonlabored breathing room air. Clear. CV: Regular rate and rhythm. No murmur, rub, or gallop. UTD JVD, 1+ pitting dependent edema. GI: Abdomen soft, non-tender, non-distended, with normoactive bowel sounds.  Ext: Warm, no deformities Skin: No new rashes, lesions or ulcers on visualized skin. Erythema with dependent induration on left lower leg, very mild erythema on anterior right lower leg without induration. Tender bilaterally. Neuro: Alert and oriented. No focal  neurological deficits. Psych: Judgement and insight appear fair. Mood euthymic & affect congruent. Behavior is appropriate.    Data Reviewed: I have personally reviewed following labs and imaging studies  CBC: Recent Labs  Lab 09/19/21 1228 09/20/21 0518  WBC 8.0 6.7  NEUTROABS 4.6  --   HGB 11.6* 12.0  HCT 36.1 37.0  MCV 94.0 93.4  PLT 250 735   Basic Metabolic Panel: Recent Labs  Lab 09/19/21 1228 09/20/21 0518 09/21/21 0714 09/22/21 0434 09/23/21 0453  NA 141 145 147* 140 138  K 3.6 3.8 4.5 3.6 4.1  CL 99 102 103 102 101  CO2 32 35* 36* 33* 32  GLUCOSE 130* 138* 124* 130* 117*  BUN 15 12 14 15 17   CREATININE 1.07* 1.00 1.10* 0.98 1.07*  CALCIUM 8.8* 8.9 9.1 8.3* 8.8*   GFR: Estimated Creatinine Clearance: 83.8 mL/min (A) (by C-G formula based on SCr of 1.07 mg/dL (H)). Liver Function Tests: Recent Labs  Lab 09/19/21 1228 09/20/21 0518  AST 19 17  ALT 15 14  ALKPHOS 69 62  BILITOT 1.1 1.2  PROT 7.3 7.0  ALBUMIN 3.8 3.7   No results for input(s): LIPASE, AMYLASE in the last 168 hours. No results for input(s): AMMONIA in the last 168 hours. Coagulation Profile: No results for input(s): INR, PROTIME in the last 168 hours. Cardiac Enzymes: No results for input(s): CKTOTAL, CKMB, CKMBINDEX, TROPONINI in the last 168 hours. BNP (last 3 results) No results for input(s): PROBNP in the last 8760 hours. HbA1C: No results for input(s): HGBA1C in the last 72 hours.  CBG: Recent Labs  Lab 09/22/21 1142 09/22/21 1625 09/22/21 2156 09/23/21 0737 09/23/21 1141  GLUCAP 109* 114* 123* 100* 117*   Lipid Profile: No results for input(s): CHOL, HDL, LDLCALC, TRIG, CHOLHDL, LDLDIRECT in the last 72 hours. Thyroid Function Tests: No results for input(s): TSH, T4TOTAL, FREET4, T3FREE, THYROIDAB in the last 72 hours. Anemia Panel: No results for input(s): VITAMINB12, FOLATE, FERRITIN, TIBC, IRON, RETICCTPCT in the last 72 hours. Urine analysis:    Component Value  Date/Time   COLORURINE YELLOW 09/19/2021 1400   APPEARANCEUR HAZY (A) 09/19/2021 1400   LABSPEC 1.005 09/19/2021 1400   PHURINE 7.0 09/19/2021 1400   GLUCOSEU NEGATIVE 09/19/2021 1400   GLUCOSEU NEGATIVE 07/07/2021 1621   HGBUR SMALL (A) 09/19/2021 1400   BILIRUBINUR NEGATIVE 09/19/2021 1400   KETONESUR NEGATIVE 09/19/2021 1400   PROTEINUR NEGATIVE 09/19/2021 1400   UROBILINOGEN 0.2 07/07/2021 1621   NITRITE NEGATIVE 09/19/2021 1400   LEUKOCYTESUR MODERATE (A) 09/19/2021 1400   Recent Results (from the past 240 hour(s))  Resp Panel by RT-PCR (Flu A&B, Covid) Nasopharyngeal Swab     Status: None   Collection Time: 09/19/21 12:28 PM   Specimen: Nasopharyngeal Swab; Nasopharyngeal(NP) swabs in vial transport medium  Result Value Ref Range Status   SARS Coronavirus 2 by RT PCR NEGATIVE NEGATIVE Final  Comment: (NOTE) SARS-CoV-2 target nucleic acids are NOT DETECTED.  The SARS-CoV-2 RNA is generally detectable in upper respiratory specimens during the acute phase of infection. The lowest concentration of SARS-CoV-2 viral copies this assay can detect is 138 copies/mL. A negative result does not preclude SARS-Cov-2 infection and should not be used as the sole basis for treatment or other patient management decisions. A negative result may occur with  improper specimen collection/handling, submission of specimen other than nasopharyngeal swab, presence of viral mutation(s) within the areas targeted by this assay, and inadequate number of viral copies(<138 copies/mL). A negative result must be combined with clinical observations, patient history, and epidemiological information. The expected result is Negative.  Fact Sheet for Patients:  EntrepreneurPulse.com.au  Fact Sheet for Healthcare Providers:  IncredibleEmployment.be  This test is no t yet approved or cleared by the [place] FDA and  has been authorized for detection and/or diagnosis  of SARS-CoV-2 by FDA under an Emergency Use Authorization (EUA). This EUA will remain  in effect (meaning this test can be used) for the duration of the COVID-19 declaration under Section 564(b)(1) of the Act, 21 U.S.C.section 360bbb-3(b)(1), unless the authorization is terminated  or revoked sooner.       Influenza A by PCR NEGATIVE NEGATIVE Final   Influenza B by PCR NEGATIVE NEGATIVE Final    Comment: (NOTE) The Xpert Xpress SARS-CoV-2/FLU/RSV plus assay is intended as an aid in the diagnosis of influenza from Nasopharyngeal swab specimens and should not be used as a sole basis for treatment. Nasal washings and aspirates are unacceptable for Xpert Xpress SARS-CoV-2/FLU/RSV testing.  Fact Sheet for Patients: EntrepreneurPulse.com.au  Fact Sheet for Healthcare Providers: IncredibleEmployment.be  This test is not yet approved or cleared by the [place] FDA and has been authorized for detection and/or diagnosis of SARS-CoV-2 by FDA under an Emergency Use Authorization (EUA). This EUA will remain in effect (meaning this test can be used) for the duration of the COVID-19 declaration under Section 564(b)(1) of the Act, 21 U.S.C. section 360bbb-3(b)(1), unless the authorization is terminated or revoked.  Performed at [facility], [place] [address], [place], [place] [address]       Radiology Studies: No results found.  Scheduled Meds:  cephALEXin  500 mg Oral Q6H   enoxaparin (LOVENOX) injection  80 mg Subcutaneous Daily   furosemide  80 mg Intravenous BID   insulin aspart  0-15 Units Subcutaneous TID WC   metoprolol tartrate  12.5 mg Oral BID   pantoprazole  40 mg Oral Daily   potassium chloride SA  20 mEq Oral BID   rOPINIRole  1 mg Oral TID   Continuous Infusions:   LOS: 4 days   Time spent: 35 minutes.  Patrecia Pour, MD [facility] [email or web address] 09/23/2021, 12:48 PM

## 2021-09-23 NOTE — Progress Notes (Addendum)
Inpatient Rehabilitation Admissions Coordinator   I have notified acute team and Dr Bonner Puna of [facility] request for peer to peer by 230 today by calling [phone] , option # 5. I will follow up after final determination from [place].  Danne Baxter, RN, MSN Rehab Admissions Coordinator [phone] 09/23/2021 2:21 PM  I have received a denial for CIR admit from insurance. I have contacted patient and she is aware. She is trying to decide SNF vs HH. I have alerted acute team and Nuala Alpha RN CM. We will sign off at this time.  Danne Baxter, RN, MSN Rehab Admissions Coordinator [phone] 09/23/2021 3:34 PM

## 2021-09-23 NOTE — TOC Progression Note (Addendum)
Transition of Care [facility]) - Progression Note    Patient Details  Name: Maria [name] MRN: [id number] Date of Birth: 04/26/1956  Transition of Care [facility]) CM/SW Contact  Kenneth Lax, Juliann Pulse, RN Phone Number: 09/23/2021, 3:55 PM  Clinical Narrative:  Noted CIR denied-left vm w/dtr Denman George with call back tel#. Await PT to make recc.    Expected Discharge Plan: [place] Barriers to Discharge: Continued Medical Work up  Expected Discharge Plan and Services Expected Discharge Plan: [place]                                               Social Determinants of Health (SDOH) Interventions    Readmission Risk Interventions Readmission Risk Prevention Plan 04/11/2021  Transportation Screening Complete  PCP or Specialist Appt within 3-5 Days Complete  HRI or Home Care Consult Complete  Social Work Consult for [place] Planning/Counseling Complete  Palliative Care Screening Not Applicable  Medication Review Press photographer) Complete  Some recent data might be hidden

## 2021-09-23 NOTE — Care Management Important Message (Signed)
Important Message  Patient Details IM Letter given to the Patient. Name: Maria [name] MRN: [id number] Date of Birth: [birth year]   Medicare Important Message Given:  Yes     Kerin Salen 09/23/2021, 2:07 PM

## 2021-09-24 DIAGNOSIS — L03119 Cellulitis of unspecified part of limb: Secondary | ICD-10-CM | POA: Diagnosis present

## 2021-09-24 DIAGNOSIS — N183 Chronic kidney disease, stage 3 unspecified: Secondary | ICD-10-CM | POA: Diagnosis present

## 2021-09-24 DIAGNOSIS — L0390 Cellulitis, unspecified: Secondary | ICD-10-CM | POA: Diagnosis present

## 2021-09-24 DIAGNOSIS — E870 Hyperosmolality and hypernatremia: Secondary | ICD-10-CM | POA: Diagnosis present

## 2021-09-24 LAB — BASIC METABOLIC PANEL
Anion gap: 8 (ref 5–15)
BUN: 21 mg/dL (ref 8–23)
CO2: 29 mmol/L (ref 22–32)
Calcium: 8.6 mg/dL — ABNORMAL LOW (ref 8.9–10.3)
Chloride: 100 mmol/L (ref 98–111)
Creatinine, Ser: 1.07 mg/dL — ABNORMAL HIGH (ref 0.44–1.00)
GFR, Estimated: 58 mL/min — ABNORMAL LOW (ref >60)
Glucose, Bld: 142 mg/dL — ABNORMAL HIGH (ref 70–99)
Potassium: 4.5 mmol/L (ref 3.5–5.1)
Sodium: 137 mmol/L (ref 135–145)

## 2021-09-24 LAB — GLUCOSE, CAPILLARY
Glucose-Capillary: 88 mg/dL (ref 70–99)
Glucose-Capillary: 112 mg/dL — ABNORMAL HIGH (ref 70–99)
Glucose-Capillary: 125 mg/dL — ABNORMAL HIGH (ref 70–99)
Glucose-Capillary: 166 mg/dL — ABNORMAL HIGH (ref 70–99)

## 2021-09-24 NOTE — Assessment & Plan Note (Signed)
-   W OC consult - Offload as able - Mobilization - Present on admission

## 2021-09-24 NOTE — Progress Notes (Signed)
  Progress Note    Maria [name]   TUU:[id number]  DOB: [birth year]  DOA: 09/19/2021     5 Date of Service: 09/24/2021    Brief summary: Maria [name] is a 65 y.o. female with a history of morbid obesity, chronic HFpEF, T2DM, HTN who presented with a month of worsening weight gain, leg swelling and orthopnea despite increases in oral diuretic regimen by PCP and cardiology. In the ED she was not hypoxic, but exam consistent with overload. CXR clear, troponin negative, BNP 34. IV lasix was started with fair diuretic response and hospitalists consulted for admission.       Subjective:  Improvement noted in Swelling and Also noted not to have fever, chest pain.  Has chronic orthopnea, no change.  May be some improvement in her dyspnea on exertion.  Hospital Problems * Acute on chronic diastolic CHF (congestive heart failure) (HCC) Net -3 L yesterday's admission.  Creatinine and potassium stable.  Starting to improve but still feels her legs are swollen - Continue IV Lasix - Continue potassium supplement - Continue strict ins and outs - Continue daily weights  Cellulitis Diagnosed by previous provider, treated with Keflex, this appears clinically better.  All I see her chronic venous insufficiency changes. - Continue Keflex until discharge.  Diabetes mellitus type 2 in obese (HCC) A1c less than 6.5% on metformin. - Hold metformin - Sliding scale corrections  Chronic pain syndrome - Continue Klonopin, oxycodone, Requip Flexeril  Hypernatremia Resolved  Aortic atherosclerosis ([place]) -Should be on a statin  Sacral decubitus ulcer, stage II ([place]) - W OC consult - Offload as able - Mobilization - Present on admission  Morbid obesity ([place]) BMI 60  Essential hypertension Blood pressure 150s - Continue furosemide and metoprolol     Objective Vital signs were reviewed and were unremarkable except for blood pressure elevation BP (!) 143/53 (BP Location: Left Arm)    Pulse 68   Temp 98.1 F (36.7 C) (Oral)   Resp 20   Ht 5\' 5"  (1.651 m)   Wt (!) 164 kg   SpO2 100%   BMI 60.17 kg/m       General appearance: Obese adult female, sitting in recliner, no acute distress, interactive, pleasant.     HEENT:    Skin:  Cardiac: RRR, no murmurs, chronic insufficiency changes of bilateral lower extremities, lymphedema noted, no pitting, venous stasis change, JVP not visible Respiratory: Respiratory effort normal at rest, dyspneic with exertion, rales in the bases, no wheezing Abdomen: Abdomen soft without tenderness palpation or guarding, no ascites or distention. MSK:  Neuro: Awake and alert, extraocular movements intact, moves all extremities with generalized weakness but symmetric strength.'s speech fluent. Psych: Attention normal, affect normal, judgment insight appear normal    Labs / Other Information My review of labs, imaging, notes and other tests is significant for Creatinine stable, normal potassium.     Time spent: 25 minutes [facility] 09/24/2021, 6:09 PM

## 2021-09-24 NOTE — Assessment & Plan Note (Signed)
Blood pressure 150s - Continue furosemide and metoprolol

## 2021-09-24 NOTE — Assessment & Plan Note (Signed)
Net -3 L yesterday's admission.  Creatinine and potassium stable.  Starting to improve but still feels her legs are swollen - Continue IV Lasix - Continue potassium supplement - Continue strict ins and outs - Continue daily weights

## 2021-09-24 NOTE — Assessment & Plan Note (Signed)
-   Continue Klonopin, oxycodone, Requip Flexeril

## 2021-09-24 NOTE — Assessment & Plan Note (Signed)
Resolved

## 2021-09-24 NOTE — Assessment & Plan Note (Signed)
BMI 60 

## 2021-09-24 NOTE — Assessment & Plan Note (Signed)
A1c less than 6.5% on metformin. - Hold metformin - Sliding scale corrections

## 2021-09-24 NOTE — Assessment & Plan Note (Addendum)
Diagnosed by previous provider, treated with Keflex, this appears clinically better.  All I see her chronic venous insufficiency changes. - Continue Keflex until discharge.

## 2021-09-24 NOTE — Assessment & Plan Note (Signed)
Stable relative to baseline 

## 2021-09-24 NOTE — Hospital Course (Signed)
Maria [name] is a 65 y.o. female with a history of morbid obesity, chronic HFpEF, T2DM, HTN who presented with a month of worsening weight gain, leg swelling and orthopnea despite increases in oral diuretic regimen by PCP and cardiology. In the ED she was not hypoxic, but exam consistent with overload. CXR clear, troponin negative, BNP 34. IV lasix was started with fair diuretic response and hospitalists consulted for admission.

## 2021-09-24 NOTE — Progress Notes (Signed)
Physical Therapy Treatment Patient Details Name: Maria [name] MRN: [id number] DOB: Dec 21, 1956 Today's Date: 09/24/2021   History of Present Illness Patient is a 65 year old female who presented to the hosptial on 9/30 with weight gain and increased edmea in BLE. patient was found to have  acute on chronic heart failure and pressure injury of skin.  PMH: CHF, chronic low back pain, HTN, HLD, DM, PVD    PT Comments    Pt very motivated to ambulate this visit and able to tolerate 80 feet with RW.  Pt typically uses rollator at home (has seat so she can take breaks).  Pt feels she can d/c home at this time so recommend HHPT.    Recommendations for follow up therapy are one component of a multi-disciplinary discharge planning process, led by the attending physician.  Recommendations may be updated based on patient status, additional functional criteria and insurance authorization.  Follow Up Recommendations  Home health PT     Equipment Recommendations  None recommended by PT    Recommendations for Other Services       Precautions / Restrictions Precautions Precautions: Fall Restrictions Weight Bearing Restrictions: No     Mobility  Bed Mobility Overal bed mobility: Modified Independent                  Transfers Overall transfer level: Needs assistance Equipment used: Rolling walker (2 wheeled) Transfers: Sit to/from Stand Sit to Stand: Supervision;Min guard         General transfer comment: good hand placement  Ambulation/Gait Ambulation/Gait assistance: Min guard;Supervision Gait Distance (Feet): 80 Feet Assistive device: Rolling walker (2 wheeled) Gait Pattern/deviations: Decreased stride length;Antalgic Gait velocity: decreased   General Gait Details: slightly antalgic gait however pt reports bil knee pain; also cues to use UEs through RW to assist with pain, distance to tolerance   Stairs             Wheelchair Mobility    Modified Rankin  (Stroke Patients Only)       Balance                                            Cognition Arousal/Alertness: Awake/alert Behavior During Therapy: WFL for tasks assessed/performed Overall Cognitive Status: Within Functional Limits for tasks assessed                                        Exercises      General Comments        Pertinent Vitals/Pain Pain Assessment: Faces Faces Pain Scale: Hurts little more Pain Location: back and knees Pain Descriptors / Indicators: Aching;Sore Pain Intervention(s): Monitored during session;Repositioned    Home Living                      Prior Function            PT Goals (current goals can now be found in the care plan section) Progress towards PT goals: Progressing toward goals    Frequency    Min 3X/week      PT Plan Discharge plan needs to be updated;Frequency needs to be updated    Co-evaluation              AM-PAC PT "6 Clicks"  Mobility   Outcome Measure  Help needed turning from your back to your side while in a flat bed without using bedrails?: A Little Help needed moving from lying on your back to sitting on the side of a flat bed without using bedrails?: A Little Help needed moving to and from a bed to a chair (including a wheelchair)?: A Little Help needed standing up from a chair using your arms (e.g., wheelchair or bedside chair)?: A Little Help needed to walk in hospital room?: A Little Help needed climbing 3-5 steps with a railing? : A Lot 6 Click Score: 17    End of Session Equipment Utilized During Treatment: Gait belt Activity Tolerance: Patient tolerated treatment well Patient left: in chair;with call bell/phone within reach Nurse Communication: Mobility status PT Visit Diagnosis: Other abnormalities of gait and mobility (R26.89)     Time: 3475-8307 PT Time Calculation (min) (ACUTE ONLY): 14 min  Charges:  $Gait Training: 8-22 mins                     Arlyce Dice, DPT Acute Rehabilitation Services Pager: [phone] Office: [place] 09/24/2021, 1:21 PM

## 2021-09-24 NOTE — Assessment & Plan Note (Signed)
-  Should be on a statin

## 2021-09-24 NOTE — Progress Notes (Signed)
Care note: I previously met Mrs. Maria [name] in the clinic. Aware Mrs. Maria [name] is admitted with acute CHF. She is diuresing quite well. Current I/O -10L. Renal function is stable. Weight down 14 lbs. She is making good progress and was able to ambulate in the hall way quite well today.   I called her and spoke with her regarding hospital progress. Glad she is make good progress and doing better.

## 2021-09-25 ENCOUNTER — Ambulatory Visit: Payer: Medicare Other | Admitting: Internal Medicine

## 2021-09-25 LAB — BASIC METABOLIC PANEL
Anion gap: 5 (ref 5–15)
BUN: 18 mg/dL (ref 8–23)
CO2: 34 mmol/L — ABNORMAL HIGH (ref 22–32)
Calcium: 9.3 mg/dL (ref 8.9–10.3)
Chloride: 105 mmol/L (ref 98–111)
Creatinine, Ser: 1.19 mg/dL — ABNORMAL HIGH (ref 0.44–1.00)
GFR, Estimated: 51 mL/min — ABNORMAL LOW (ref >60)
Glucose, Bld: 114 mg/dL — ABNORMAL HIGH (ref 70–99)
Potassium: 4.3 mmol/L (ref 3.5–5.1)
Sodium: 144 mmol/L (ref 135–145)

## 2021-09-25 LAB — GLUCOSE, CAPILLARY
Glucose-Capillary: 111 mg/dL — ABNORMAL HIGH (ref 70–99)
Glucose-Capillary: 112 mg/dL — ABNORMAL HIGH (ref 70–99)

## 2021-09-25 MED ORDER — BISACODYL 10 MG RE SUPP
10.0000 mg | Freq: Once | RECTAL | Status: AC
  Administered 2021-09-25: 10 mg via RECTAL
  Filled 2021-09-25: qty 1

## 2021-09-25 NOTE — Consult Note (Signed)
[facility] CM Inpatient Consult   09/25/2021  Maria [name] 02/24/1956   [facility] Follow Up:  Spoke with patient at bedside. HIPAA verified. Patient states that she has been active at primary care office with chronic case management team for assistance and education regarding disease management. Patient states that she receives rides from her insurance company for hospital visits. States that she does not drive but would like to have additional means of transportation for conducting errands. Patient states that she welcomes additional education on meal preparation for heart failure patients.  Plan: Will update [facility] CM community care coordinator and continue to follow for progression.  Of note, [facility] Care Management services does not replace or interfere with any services that are arranged by inpatient case management or social work.   Netta Cedars, MSN, [facility] Liaison Nurse Mobile Phone [phone]  Toll free office [phone]

## 2021-09-25 NOTE — Progress Notes (Signed)
Pt to be discharged to home this afternoon. Pt given discharge teaching including all discharge Medications and schedules for these Medications. Pt verbalized understanding of all discharge teaching. Discharge AVS with Pt at time of discharge. VSS and no acute changes noted with Pt's assessment at time of discharge

## 2021-09-25 NOTE — Progress Notes (Signed)
Pt in the bathroom with c/o difficulty passing BM. Pt states "I can feel it but I can't push it out. RN checked rectal large BM noted halfway out of rectum and small amount of blood in the toilet and around rectum. RN able to gently ease BM out. MD making rounds made of situation and new orders to be placed. VSS and no other acute changes noted with Pt's assessment at this time

## 2021-09-25 NOTE — TOC Transition Note (Signed)
Transition of Care [facility]) - CM/SW Discharge Note   Patient Details  Name: Latronda Spink Lungren MRN: [id number] Date of Birth: Apr 25, 1956  [facility] CM/SW Contact:  Dessa Phi, RN Phone Number: 09/25/2021, 12:03 PM   Clinical Narrative: d/c home w/HH-[place] rep Tommi Rumps following-HHPT/OT.[facility] will provide nsg teaching for disease mgnt. No further CM needs.     Final next level of care: [place] Barriers to Discharge: No Barriers Identified   Patient Goals and CMS Choice Patient states their goals for this hospitalization and ongoing recovery are:: go home CMS Medicare.gov Compare Post Acute Care list provided to:: Patient Represenative (must comment) Choice offered to / list presented to : Patient  Discharge Placement                       Discharge Plan and Services   Discharge Planning Services: CM Consult Post Acute Care Choice: Home Health                    HH Arranged: PT, OT [facility] Agency: [place] Date [place]: 09/25/21 Time [place]: 7001 Representative spoke with at [place]: [place] ([place]) Interventions     Readmission Risk Interventions Readmission Risk Prevention Plan 04/11/2021  Transportation Screening Complete  PCP or Specialist Appt within 3-5 Days Complete  HRI or [place] Complete  Social Work Consult for [place] Planning/Counseling Complete  Palliative Care Screening Not Applicable  Medication Review Press photographer) Complete  Some recent data might be hidden

## 2021-09-25 NOTE — Progress Notes (Signed)
Occupational Therapy Treatment Patient Details Name: Maria [name] MRN: [id number] DOB: [birth year] Today's Date: 09/25/2021   History of present illness Patient is a 65 year old female who presented to the hosptial on 9/30 with weight gain and increased edmea in BLE. patient was found to have  acute on chronic heart failure and pressure injury of skin.  PMH: CHF, chronic low back pain, HTN, HLD, DM, PVD   OT comments  Pt met all OT goals this session. Pt performed functional mobility room distance with RW and demonstrated LB dressing both at Mod I level. Pt educated on CHF management, pt verbalized good understanding of disease management. Pt also educated on use of head light in the dark rather than flashlight for safe RW use and to decrease fall risk. Due to current level of function, updating recommendation to home with intermittent supervision. All education completed in preparation for d/c today, all OT needs met.   Recommendations for follow up therapy are one component of a multi-disciplinary discharge planning process, led by the attending physician.  Recommendations may be updated based on patient status, additional functional criteria and insurance authorization.    Follow Up Recommendations  No OT follow up;Supervision - Intermittent    Equipment Recommendations  None recommended by OT    Recommendations for Other Services      Precautions / Restrictions Precautions Precautions: Fall Restrictions Weight Bearing Restrictions: No       Mobility Bed Mobility               General bed mobility comments: Pt sitting in recliner upon arrival    Transfers Overall transfer level: Modified independent Equipment used: Rolling walker (2 wheeled) Transfers: Sit to/from Stand   Stand pivot transfers: Modified independent (Device/Increase time)       General transfer comment: Pt mod I level with functional mobility to bathroom and back to chair, needed increased  time    Balance Overall balance assessment: Modified Independent Sitting-balance support: Feet supported;No upper extremity supported Sitting balance-Leahy Scale: Good     Standing balance support: During functional activity Standing balance-Leahy Scale: Fair Standing balance comment: using RW during functional mobility, donned pants without external support from RW and no LOBs                           ADL either performed or assessed with clinical judgement   ADL Overall ADL's : Needs assistance/impaired                     Lower Body Dressing: Sit to/from stand;Modified independent               Functional mobility during ADLs: Rolling walker;Modified independent       Vision Patient Visual Report: No change from baseline     Perception     Praxis      Cognition Arousal/Alertness: Awake/alert Behavior During Therapy: WFL for tasks assessed/performed Overall Cognitive Status: Within Functional Limits for tasks assessed                                 General Comments: Able to follow multistep commands        Exercises     Shoulder Instructions       General Comments      Pertinent Vitals/ Pain       Pain Assessment: 0-10 Faces Pain Scale:  No hurt  Home Living                                          Prior Functioning/Environment              Frequency           Progress Toward Goals  OT Goals(current goals can now be found in the care plan section)  Progress towards OT goals: Goals met/education completed, patient discharged from OT  Acute Rehab OT Goals Patient Stated Goal: to go home OT Goal Formulation: With patient Time For Goal Achievement: 10/09/21 Potential to Achieve Goals: Good  Plan Discharge plan needs to be updated    Co-evaluation                 AM-PAC OT "6 Clicks" Daily Activity     Outcome Measure   Help from another person eating meals?:  None Help from another person taking care of personal grooming?: None Help from another person toileting, which includes using toliet, bedpan, or urinal?: None Help from another person bathing (including washing, rinsing, drying)?: None Help from another person to put on and taking off regular upper body clothing?: None Help from another person to put on and taking off regular lower body clothing?: None 6 Click Score: 24    End of Session Equipment Utilized During Treatment: Gait belt;Rolling walker  OT Visit Diagnosis: Muscle weakness (generalized) (M62.81)   Activity Tolerance Patient tolerated treatment well   Patient Left in chair;with call bell/phone within reach;with chair alarm set   Nurse Communication Mobility status        Time: (P) 1146-(P) 1159 OT Time Calculation (min): (P) 13 min  Charges: OT General Charges $OT Visit: 1 Visit OT Treatments $Self Care/Home Management : 8-22 mins  Jackquline Denmark, OTS Acute Rehab Office: [phone]   [name] 09/25/2021, 12:39 PM

## 2021-09-26 ENCOUNTER — Telehealth: Payer: Self-pay | Admitting: Internal Medicine

## 2021-09-26 NOTE — Discharge Summary (Addendum)
Physician Discharge Summary   Patient name: Maria [name]  Admit date:     09/19/2021  Discharge date: 09/25/2021  Attending Physician: Jonnie Finner [[id number]]  Discharge Physician: Edwin Dada   PCP: Biagio Borg, MD     Recommendations at discharge:  1.  Follow up with Dr. [name] Reichmann, PCP in 1 week 2.  Dr. [name] Reichmann: Please obtain BMP to follow-up sodium and creatinine in 1 week 3.  Follow-up with cardiology in 2 to 4 weeks          Follow-up Information     Biagio Borg, MD. Schedule an appointment as soon as possible for a visit in 1 week(s).   Specialties: Internal Medicine, Radiology Contact information: [place] [address] [phone]         Almyra Deforest, [place]. Schedule an appointment as soon as possible for a visit in 2 week(s).   Specialties: Cardiology, Radiology Contact information: [address] [place] [address] [place], [facility] Follow up.   Specialty: Home Health Services Why: [facility] physical/occupational therapy Contact information: [place] [address] [phone]                   Principal discharge diagnosis Acute on chronic diastolic CHF  Other discharge Diagnoses    Diabetes mellitus type 2 in obese [facility])   Possible cellulitis   Chronic pain syndrome   Aortic atherosclerosis (HCC)   Hypernatremia   Essential hypertension   Morbid obesity ([place])   Sacral decubitus ulcer, stage II (HCC)   CKD (chronic kidney disease), stage IIIa [facility])          Hospital Course   Maria [name] is a 65 y.o. F with MO, dCHF, DM, and HTN who presented with 1 month worsening leg swelling, leg fullness, shortness of breath on exertion despite increasing doses of oral diuretic.  In the ER appeared swollen and could not walk.  Started on IV Lasix and admitted for CHF.     * Acute on chronic diastolic CHF (congestive heart failure)  ([place]) Despite clear chest x-ray, BNP 34, the patient appeared swollen beyond her baseline adiposity, and reported symptoms of chronic orthopnea and worsening dyspnea on exertion and leg fullness consistent with CHF.   She was started on IV Lasix, diuresied 13L fluid total, and had resolution of her symptoms and improvement of leg symptoms (appearance of leg wrinkles).    Able to ambulate and transfer without assistance.    Discharged on torsemide 80 BID and close Cardiology follow up.    Possible Cellulitis Diagnosed by admitted MD.  Likely this was just redness from edema.  Got few days cephalexin, but symptoms resolved with diruesis, antibiotics stopped.  No leukocytosis or fever or purulence.     Diabetes mellitus type 2 in obese well controlled A1c less than 6.5% on metformin.  Chronic pain syndrome   Hypernatremia Resolved without treatment  Aortic atherosclerosis (HCC) Recommend statin  CKD (chronic kidney disease), stage IIIa (HCC)   Sacral decubitus ulcer, stage II (HCC)  Morbid obesity (HCC) BMI 60  Essential hypertension       Condition at discharge: good  Exam Physical Exam Constitutional:      General: She is not in acute distress.    Appearance: She is well-developed. She is obese. She is not toxic-appearing.  Cardiovascular:     Rate and Rhythm: Normal  rate and regular rhythm.     Heart sounds: No murmur heard. Pulmonary:     Effort: Pulmonary effort is normal.     Breath sounds: Normal breath sounds. No wheezing or rales.  Abdominal:     Palpations: Abdomen is soft.     Tenderness: There is no abdominal tenderness. There is no rebound.  Musculoskeletal:     Right lower leg: No edema.     Left lower leg: No edema.  Skin:    General: Skin is warm and dry.     Findings: No rash.     Comments: Chronic venous stasis changes to bilateral lower extremities  Neurological:     General: No focal deficit present.     Mental Status: She is alert and  oriented to person, place, and time.     Motor: No weakness.  Psychiatric:        Mood and Affect: Mood normal.        Behavior: Behavior normal.      Disposition: Home health  Discharge time: less than 30 minutes. Allergies as of 09/25/2021       Reactions   Amoxicillin-pot Clavulanate Nausea And Vomiting   Projectile vomiting Did it involve swelling of the face/tongue/throat, SOB, or low BP? No Did it involve sudden or severe rash/hives, skin peeling, or any reaction on the inside of your mouth or nose? No Did you need to seek medical attention at a hospital or doctor's office? No When did it last happen?      20-30 years If all above answers are "NO", may proceed with cephalosporin use.   Adhesive [tape] Other (See Comments)   Tears skin off - use paper tape    Codeine Other (See Comments)   hallucinations   Crestor [rosuvastatin Calcium]    Severe muscle cramps   Lactose Intolerance (gi) Nausea And Vomiting   Morphine And Related Nausea And Vomiting, Other (See Comments)   Migraine headaches   Vicodin [hydrocodone-acetaminophen] Other (See Comments)   hallucinations   Latex Rash        Medication List     STOP taking these medications    loperamide 2 MG capsule Commonly known as: IMODIUM       TAKE these medications    Accu-Chek Guide test strip Generic drug: glucose blood Use as instructed up to 4 times daily   Accu-Chek Guide w/Device Kit Use as directed What changed:  how much to take how to take this when to take this additional instructions   Accu-Chek Softclix Lancets lancets Use as directed up to 4 times daily What changed:  how much to take how to take this when to take this additional instructions   acetaminophen 325 MG tablet Commonly known as: TYLENOL Take 650 mg by mouth every 6 (six) hours as needed for headache or fever (pain).   albuterol 108 (90 Base) MCG/ACT inhaler Commonly known as: ProAir HFA Inhale 2 puffs into the  lungs every 6 (six) hours as needed for wheezing or shortness of breath.   ALLEGRA PO Take 180 mg by mouth daily as needed (allergies).   clonazePAM 1 MG tablet Commonly known as: KLONOPIN TAKE 1 TABLET BY MOUTH 2 TIMES A DAY AS NEEDED FOR ANXIETY What changed: See the new instructions.   cyclobenzaprine 10 MG tablet Commonly known as: FLEXERIL TAKE 1 TABLET BY MOUTH 2 TIMES A DAY AS NEEDED FOR MUSCLE SPASMS What changed: See the new instructions.   GAS-X EXTRA STRENGTH PO  Take 1 tablet by mouth daily as needed (gas).   losartan 50 MG tablet Commonly known as: COZAAR Take 1 tablet (50 mg total) by mouth daily. Notes to patient: Last Dose of this Medication was given on September 25, 2021 at 8:28 am   Metamucil Fiber Chew Chew 3 tablets by mouth at bedtime.   metFORMIN 500 MG tablet Commonly known as: GLUCOPHAGE Take 1 tablet (500 mg total) by mouth 2 (two) times daily with a meal. Notes to patient: Please resume this Medication at home   metoprolol tartrate 25 MG tablet Commonly known as: LOPRESSOR Take 25 mg by mouth 2 (two) times daily. What changed: Another medication with the same name was removed. Continue taking this medication, and follow the directions you see here. Notes to patient: Last Dose of this Medication was given on September 25, 2021 at 8:51 am Please take a second dose of this Medication tonight   Narcan 4 MG/0.1ML Liqd nasal spray kit Generic drug: naloxone Place 0.4 mg into the nose daily as needed (opioid overdose).   ondansetron 4 MG disintegrating tablet Commonly known as: Zofran ODT Take 1 tablet (4 mg total) by mouth every 4 (four) hours as needed for nausea or vomiting.   Oxycodone HCl 10 MG Tabs Take 1 tablet (10 mg total) 4 (four) times daily by mouth. Per [facility] What changed:  when to take this reasons to take this Notes to patient: Last Dose of this Medication was given on September 25, 2021 at 8:51 am   pantoprazole 40 MG  tablet Commonly known as: PROTONIX TAKE ONE TABLET BY MOUTH DAILY Notes to patient: Last Dose of this Medication was given on September 25, 2021 at 8:51 am   Polyethyl Glycol-Propyl Glycol 0.4-0.3 % Soln Apply 1 drop to eye daily as needed (dry eyes).   polyethylene glycol powder 17 GM/SCOOP powder Commonly known as: GLYCOLAX/MIRALAX Take 17 g by mouth daily as needed for mild constipation.   potassium chloride SA 20 MEQ tablet Commonly known as: KLOR-CON Take 1 tablet (20 mEq total) by mouth 2 (two) times daily. What changed: Another medication with the same name was removed. Continue taking this medication, and follow the directions you see here. Notes to patient: Last Dose of this Medication was given on September 25, 2021 at 8:52 am Please take a second dose of this Medication tonight    rOPINIRole 1 MG tablet Commonly known as: Requip Take 1 tablet (1 mg total) by mouth 3 (three) times daily. Notes to patient: Last Dose of this Medication was given on September 25, 2021 at 8:51 am This Medication is to be taken 3 times per day.  Please take two more doses today of this Medication .    torsemide 20 MG tablet Commonly known as: DEMADEX Take 20 mg  4 tablets ( 80 mg ) twice a day.May take a extra 20 mg daily if weight over 370 lbs. Notes to patient: Please resume this Medication at home   vitamin D3 50 MCG (2000 UT) Caps Take 2 capsules by mouth daily. Notes to patient: Please resume this Medication at home         [facility] Chest [facility] 1 View  Result Date: 09/19/2021 CLINICAL DATA:  Shortness of breath.  Evaluate for CHF. EXAM: PORTABLE CHEST 1 VIEW COMPARISON:  08/10/2021 FINDINGS: Stable cardiomediastinal contours. No pleural effusion or interstitial edema. No airspace densities. Review of the visualized osseous structures is unremarkable. IMPRESSION: No acute cardiopulmonary abnormalities. Electronically Signed  By: Kerby Moors M.D.   On: 09/19/2021 13:05   ECHOCARDIOGRAM  COMPLETE  Result Date: 09/20/2021    ECHOCARDIOGRAM REPORT   Patient Name:   Jerolyn A Nobrega Date of Exam: 09/20/2021 Medical Rec #:  [id number]       Height:       65.0 in Accession #:    [id number]      Weight:       375.2 lb Date of Birth:  10/01/1956       BSA:          2.585 m Patient Age:    65 years        BP:           139/51 mmHg Patient Gender: F               HR:           64 bpm. Exam Location:  Inpatient Procedure: 2D Echo, Color Doppler and Cardiac Doppler Indications:    O53.66 Acute diastolic (congestive) heart failure  History:        Patient has prior history of Echocardiogram examinations, most                 recent 03/08/2021. CHF; Risk Factors:Hypertension, Diabetes,                 Dyslipidemia and Sleep Apnea.  Sonographer:    Raquel Sarna Senior RDCS Referring Phys: [id number] Jonnie Finner  Sonographer Comments: Suboptimal apical window due to body habitus. IMPRESSIONS  1. Left ventricular ejection fraction, by estimation, is 65 to 70%. The left ventricle has normal function. The left ventricle has no regional wall motion abnormalities. Left ventricular diastolic parameters are consistent with Grade II diastolic dysfunction (pseudonormalization). Elevated left ventricular end-diastolic pressure.  2. Right ventricular systolic function is normal. The right ventricular size is normal. Tricuspid regurgitation signal is inadequate for assessing PA pressure.  3. Left atrial size was upper normal.  4. The mitral valve is grossly normal. No evidence of mitral valve regurgitation.  5. The aortic valve is tricuspid. Aortic valve regurgitation is not visualized.  6. The inferior vena cava is dilated in size with >50% respiratory variability, suggesting right atrial pressure of 8 mmHg. Comparison(s): Prior images reviewed side by side. LVEF vigorous with probable increased LVEDP. FINDINGS  Left Ventricle: Left ventricular ejection fraction, by estimation, is 65 to 70%. The left ventricle has normal function. The  left ventricle has no regional wall motion abnormalities. The left ventricular internal cavity size was normal in size. There is  no left ventricular hypertrophy. Left ventricular diastolic parameters are consistent with Grade II diastolic dysfunction (pseudonormalization). Elevated left ventricular end-diastolic pressure. Right Ventricle: The right ventricular size is normal. No increase in right ventricular wall thickness. Right ventricular systolic function is normal. Tricuspid regurgitation signal is inadequate for assessing PA pressure. Left Atrium: Left atrial size was upper normal. Right Atrium: Right atrial size was normal in size. Pericardium: There is no evidence of pericardial effusion. Presence of pericardial fat pad. Mitral Valve: The mitral valve is grossly normal. Mild mitral annular calcification. No evidence of mitral valve regurgitation. Tricuspid Valve: The tricuspid valve is grossly normal. Tricuspid valve regurgitation is mild. Aortic Valve: The aortic valve is tricuspid. There is mild aortic valve annular calcification. Aortic valve regurgitation is not visualized. Pulmonic Valve: The pulmonic valve was grossly normal. Pulmonic valve regurgitation is not visualized. Aorta: The aortic root is normal in size and structure. Venous: The  inferior vena cava is dilated in size with greater than 50% respiratory variability, suggesting right atrial pressure of 8 mmHg. IAS/Shunts: No atrial level shunt detected by color flow Doppler.  LEFT VENTRICLE PLAX 2D LVIDd:         5.70 cm  Diastology LVIDs:         3.10 cm  LV e' medial:    5.44 cm/s LV PW:         1.00 cm  LV E/e' medial:  20.4 LV IVS:        0.80 cm  LV e' lateral:   6.31 cm/s LVOT diam:     1.90 cm  LV E/e' lateral: 17.6 LV SV:         89 LV SV Index:   35 LVOT Area:     2.84 cm  RIGHT VENTRICLE RV S prime:     14.40 cm/s TAPSE (M-mode): 2.8 cm LEFT ATRIUM             Index       RIGHT ATRIUM           Index LA diam:        4.10 cm 1.59 cm/m   RA Area:     22.60 cm LA Vol (A2C):   84.2 ml 32.58 ml/m RA Volume:   62.00 ml  23.99 ml/m LA Vol (A4C):   61.6 ml 23.83 ml/m LA Biplane Vol: 78.1 ml 30.22 ml/m  AORTIC VALVE LVOT Vmax:   130.00 cm/s LVOT Vmean:  98.500 cm/s LVOT VTI:    0.315 m  AORTA Ao Root diam: 3.70 cm Ao Asc diam:  3.50 cm MITRAL VALVE MV Area (PHT): 2.37 cm     SHUNTS MV Decel Time: 320 msec     Systemic VTI:  0.32 m MV E velocity: 111.00 cm/s  Systemic Diam: 1.90 cm MV A velocity: 121.00 cm/s MV E/A ratio:  0.92 Rozann Lesches MD Electronically signed by Rozann Lesches MD Signature Date/Time: 09/20/2021/2:36:01 PM    Final    Results for orders placed or performed during the hospital encounter of 09/19/21  Resp Panel by RT-PCR (Flu A&B, Covid) Nasopharyngeal Swab     Status: None   Collection Time: 09/19/21 12:28 PM   Specimen: Nasopharyngeal Swab; Nasopharyngeal(NP) swabs in vial transport medium  Result Value Ref Range Status   SARS Coronavirus 2 by RT PCR NEGATIVE NEGATIVE Final    Comment: (NOTE) SARS-CoV-2 target nucleic acids are NOT DETECTED.  The SARS-CoV-2 RNA is generally detectable in upper respiratory specimens during the acute phase of infection. The lowest concentration of SARS-CoV-2 viral copies this assay can detect is 138 copies/mL. A negative result does not preclude SARS-Cov-2 infection and should not be used as the sole basis for treatment or other patient management decisions. A negative result may occur with  improper specimen collection/handling, submission of specimen other than nasopharyngeal swab, presence of viral mutation(s) within the areas targeted by this assay, and inadequate number of viral copies(<138 copies/mL). A negative result must be combined with clinical observations, patient history, and epidemiological information. The expected result is Negative.  Fact Sheet for Patients:  EntrepreneurPulse.com.au  Fact Sheet for Healthcare Providers:   IncredibleEmployment.be  This test is no t yet approved or cleared by the [place] FDA and  has been authorized for detection and/or diagnosis of SARS-CoV-2 by FDA under an Emergency Use Authorization (EUA). This EUA will remain  in effect (meaning this test can be used) for the duration  of the COVID-19 declaration under Section 564(b)(1) of the Act, 21 U.S.C.section 360bbb-3(b)(1), unless the authorization is terminated  or revoked sooner.       Influenza A by PCR NEGATIVE NEGATIVE Final   Influenza B by PCR NEGATIVE NEGATIVE Final    Comment: (NOTE) The Xpert Xpress SARS-CoV-2/FLU/RSV plus assay is intended as an aid in the diagnosis of influenza from Nasopharyngeal swab specimens and should not be used as a sole basis for treatment. Nasal washings and aspirates are unacceptable for Xpert Xpress SARS-CoV-2/FLU/RSV testing.  Fact Sheet for Patients: EntrepreneurPulse.com.au  Fact Sheet for Healthcare Providers: IncredibleEmployment.be  This test is not yet approved or cleared by the [place] FDA and has been authorized for detection and/or diagnosis of SARS-CoV-2 by FDA under an Emergency Use Authorization (EUA). This EUA will remain in effect (meaning this test can be used) for the duration of the COVID-19 declaration under Section 564(b)(1) of the Act, 21 U.S.C. section 360bbb-3(b)(1), unless the authorization is terminated or revoked.  Performed at [facility], [place] [address], [place], [place] [address]     Signed:  Edwin Dada MD.  [facility] 09/25/2021, 8:27 PM

## 2021-09-26 NOTE — Telephone Encounter (Signed)
Spoke with Claiborne Billings and gave the okay to resume nursing and PT 09/27/21

## 2021-09-26 NOTE — Assessment & Plan Note (Signed)
-   W OC consult - Offload as able - Mobilization - Present on admission

## 2021-09-26 NOTE — Assessment & Plan Note (Signed)
Resolved

## 2021-09-26 NOTE — Telephone Encounter (Signed)
   Claiborne Billings from [facility] asking for orders to resume  nursing and PT Patient went into hospital recently, back home. Staff can schedule visit for 10/8  Phone [phone] Fax [phone]

## 2021-09-26 NOTE — Assessment & Plan Note (Signed)
A1c less than 6.5% on metformin. - Hold metformin - Sliding scale corrections

## 2021-09-26 NOTE — Assessment & Plan Note (Signed)
Stable relative to baseline 

## 2021-09-26 NOTE — Assessment & Plan Note (Signed)
-   Continue Klonopin, oxycodone, Requip Flexeril

## 2021-09-26 NOTE — Assessment & Plan Note (Signed)
-  Should be on a statin

## 2021-09-26 NOTE — Assessment & Plan Note (Signed)
Diagnosed by previous provider, treated with Keflex, this appears clinically better.  All I see her chronic venous insufficiency changes. - Continue Keflex until discharge.

## 2021-09-26 NOTE — Assessment & Plan Note (Signed)
BMI 60 

## 2021-09-26 NOTE — Assessment & Plan Note (Signed)
Net -3 L yesterday's admission.  Creatinine and potassium stable.  Starting to improve but still feels her legs are swollen - Continue IV Lasix - Continue potassium supplement - Continue strict ins and outs - Continue daily weights

## 2021-09-26 NOTE — Assessment & Plan Note (Signed)
Blood pressure 150s - Continue furosemide and metoprolol

## 2021-09-27 DIAGNOSIS — E1151 Type 2 diabetes mellitus with diabetic peripheral angiopathy without gangrene: Secondary | ICD-10-CM | POA: Diagnosis not present

## 2021-09-27 DIAGNOSIS — I872 Venous insufficiency (chronic) (peripheral): Secondary | ICD-10-CM | POA: Diagnosis not present

## 2021-09-27 DIAGNOSIS — I5033 Acute on chronic diastolic (congestive) heart failure: Secondary | ICD-10-CM | POA: Diagnosis not present

## 2021-09-27 DIAGNOSIS — I890 Lymphedema, not elsewhere classified: Secondary | ICD-10-CM | POA: Diagnosis not present

## 2021-09-27 DIAGNOSIS — I110 Hypertensive heart disease with heart failure: Secondary | ICD-10-CM | POA: Diagnosis not present

## 2021-09-29 DIAGNOSIS — I110 Hypertensive heart disease with heart failure: Secondary | ICD-10-CM | POA: Diagnosis not present

## 2021-09-29 DIAGNOSIS — I872 Venous insufficiency (chronic) (peripheral): Secondary | ICD-10-CM | POA: Diagnosis not present

## 2021-09-29 DIAGNOSIS — E1151 Type 2 diabetes mellitus with diabetic peripheral angiopathy without gangrene: Secondary | ICD-10-CM | POA: Diagnosis not present

## 2021-09-29 DIAGNOSIS — I5033 Acute on chronic diastolic (congestive) heart failure: Secondary | ICD-10-CM | POA: Diagnosis not present

## 2021-09-29 DIAGNOSIS — I890 Lymphedema, not elsewhere classified: Secondary | ICD-10-CM | POA: Diagnosis not present

## 2021-09-30 ENCOUNTER — Telehealth: Payer: Self-pay

## 2021-09-30 ENCOUNTER — Telehealth: Payer: Self-pay | Admitting: Cardiology

## 2021-09-30 DIAGNOSIS — I110 Hypertensive heart disease with heart failure: Secondary | ICD-10-CM | POA: Diagnosis not present

## 2021-09-30 DIAGNOSIS — R600 Localized edema: Secondary | ICD-10-CM

## 2021-09-30 DIAGNOSIS — I5033 Acute on chronic diastolic (congestive) heart failure: Secondary | ICD-10-CM | POA: Diagnosis not present

## 2021-09-30 DIAGNOSIS — R252 Cramp and spasm: Secondary | ICD-10-CM

## 2021-09-30 DIAGNOSIS — I5032 Chronic diastolic (congestive) heart failure: Secondary | ICD-10-CM

## 2021-09-30 DIAGNOSIS — I890 Lymphedema, not elsewhere classified: Secondary | ICD-10-CM | POA: Diagnosis not present

## 2021-09-30 DIAGNOSIS — I872 Venous insufficiency (chronic) (peripheral): Secondary | ICD-10-CM | POA: Diagnosis not present

## 2021-09-30 DIAGNOSIS — E1151 Type 2 diabetes mellitus with diabetic peripheral angiopathy without gangrene: Secondary | ICD-10-CM | POA: Diagnosis not present

## 2021-09-30 NOTE — Telephone Encounter (Signed)
Maria [name] from [facility] states Dr. Percival Spanish ordered lab work for the patient but she was unable to get it. She says they will send out a different nurse to see if they can get it tomorrow. She says assessment wise she is stable and if there are any questions they can call home health call the [facility] office.

## 2021-09-30 NOTE — Telephone Encounter (Signed)
Spoke with pt she states there is not a lot of swelling, it's more so cramping. She has been taking Torsemide as prescribed. Pt was in Hospital and was on IV Lasix. She was discharged 10/06. Pt will call to see if a nurse can come to her home and draw labs. She will call the office back with the answer.

## 2021-09-30 NOTE — Telephone Encounter (Signed)
Spoke with Sharyn Lull she states they can have the labs drawn on the pt today. She will have the results faxed to [phone]. Pt is unable to come into the office to be seen this week due to transportation issues.

## 2021-09-30 NOTE — Telephone Encounter (Signed)
Pt c/o swelling: STAT is pt has developed SOB within 24 hours  If swelling, where is the swelling located? Feet and legs   How much weight have you gained and in what time span? 2 pounds in 2 days  Have you gained 3 pounds in a day or 5 pounds in a week?   Do you have a log of your daily weights (if so, list)?   09/26/21: 360  09/27/21: 358  09/28/21: 358  09/29/21: 361   09/30/21: 360   Are you currently taking a fluid pill? yes  Are you currently SOB? A little, but the patient thinks its more fear and pain related. She also gets short of breath when she gets up and starts moving around  Have you traveled recently? No  Patient said her urine output is lower than it normally is. Patient also is experiencing some cramping in her legs and feet

## 2021-10-01 ENCOUNTER — Telehealth: Payer: Self-pay | Admitting: Internal Medicine

## 2021-10-01 NOTE — Telephone Encounter (Signed)
RN called patient .   Patient states  -- not short of breath  sitting,  97 % oxygen sat.   Patient is taking  4 tablets of 20 mg of Demadex in the morning and 4 tablets of 20 mg in the evening   Along with  20 meq potassium twice a day.  Patient does not have  follow up appointment from recent hospitalization per discharge  appt need 2-to 4 weeks  RN offered an appointment for  Monday 10/17 at 11:40 am with Dr Percival Spanish.  Patient states she could make that appointment she has another appointment already schedule for  that day  and she will need at least 3 day window to make arrangement with  transportation.    Will defer to Dr Percival Spanish

## 2021-10-01 NOTE — Telephone Encounter (Signed)
    Liji from [employer] has called and states she went out to complete an evaluation on the pt. On Monday. States that she will see pt. This week and 1x next week, then would like to proceed with re-certification. Would like a call back with verbal order to recertify pt.   Callback #- [phone]

## 2021-10-01 NOTE — Telephone Encounter (Signed)
Ok for verbals 

## 2021-10-01 NOTE — Telephone Encounter (Signed)
Spoke to patient schedule appointment for appt 10/09/21 at 2 pm  patient is aware - will contact treansportation.

## 2021-10-01 NOTE — Telephone Encounter (Signed)
Spoke to [employer] - with [employer]   She states  she sent  a nurse yesterday to draw labs - magnesium and BMP . The nurse was unable to draw lab - patient was difficult  stick.   Per Sharyn Lull, she contacted the patient this morning by phone ,  the information given  was the conversation between home health and patient.   RN will call and speak to patient.

## 2021-10-01 NOTE — Telephone Encounter (Signed)
Liji from [employer] has called and states she went out to complete an evaluation on the pt. On Monday. States that she will see pt. This week and 1x next week, then will proceed with re-certification. Would like a call back with ver

## 2021-10-01 NOTE — Telephone Encounter (Signed)
Pt c/o swelling: STAT is pt has developed SOB within 24 hours  How much weight have you gained and in what time span?  Patient has gained about 2 lbs in 2 days, per [facility] with [facility]  If swelling, where is the swelling located?  Feet and legs   Are you currently taking a fluid pill?  Yes   Are you currently SOB?  Sharyn Lull is not currently with the patient  Do you have a log of your daily weights (if so, list)?  10/07: 360 lbs 10/08: 358 lbs  10/09: 358 lbs  10/10: 361 lbs 10/11: 360 lbs 10/12: 362 lbs  Have you gained 3 pounds in a day or 5 pounds in a week?   Have you traveled recently?  No

## 2021-10-02 DIAGNOSIS — E1151 Type 2 diabetes mellitus with diabetic peripheral angiopathy without gangrene: Secondary | ICD-10-CM | POA: Diagnosis not present

## 2021-10-02 DIAGNOSIS — I5033 Acute on chronic diastolic (congestive) heart failure: Secondary | ICD-10-CM | POA: Diagnosis not present

## 2021-10-02 DIAGNOSIS — I110 Hypertensive heart disease with heart failure: Secondary | ICD-10-CM | POA: Diagnosis not present

## 2021-10-02 DIAGNOSIS — I872 Venous insufficiency (chronic) (peripheral): Secondary | ICD-10-CM | POA: Diagnosis not present

## 2021-10-02 DIAGNOSIS — I890 Lymphedema, not elsewhere classified: Secondary | ICD-10-CM | POA: Diagnosis not present

## 2021-10-02 NOTE — Telephone Encounter (Signed)
Verbals left on secure voicemail for Maria [name] with [employer]

## 2021-10-03 DIAGNOSIS — M545 Low back pain, unspecified: Secondary | ICD-10-CM | POA: Diagnosis not present

## 2021-10-03 DIAGNOSIS — M79661 Pain in right lower leg: Secondary | ICD-10-CM | POA: Diagnosis not present

## 2021-10-03 DIAGNOSIS — M25552 Pain in left hip: Secondary | ICD-10-CM | POA: Diagnosis not present

## 2021-10-03 DIAGNOSIS — J321 Chronic frontal sinusitis: Secondary | ICD-10-CM | POA: Diagnosis not present

## 2021-10-03 DIAGNOSIS — Z79891 Long term (current) use of opiate analgesic: Secondary | ICD-10-CM | POA: Diagnosis not present

## 2021-10-03 DIAGNOSIS — M25561 Pain in right knee: Secondary | ICD-10-CM | POA: Diagnosis not present

## 2021-10-03 DIAGNOSIS — M25551 Pain in right hip: Secondary | ICD-10-CM | POA: Diagnosis not present

## 2021-10-03 DIAGNOSIS — M25562 Pain in left knee: Secondary | ICD-10-CM | POA: Diagnosis not present

## 2021-10-03 DIAGNOSIS — M1711 Unilateral primary osteoarthritis, right knee: Secondary | ICD-10-CM | POA: Diagnosis not present

## 2021-10-03 DIAGNOSIS — M25512 Pain in left shoulder: Secondary | ICD-10-CM | POA: Diagnosis not present

## 2021-10-03 DIAGNOSIS — G894 Chronic pain syndrome: Secondary | ICD-10-CM | POA: Diagnosis not present

## 2021-10-03 DIAGNOSIS — M542 Cervicalgia: Secondary | ICD-10-CM | POA: Diagnosis not present

## 2021-10-06 ENCOUNTER — Encounter: Payer: Self-pay | Admitting: Podiatry

## 2021-10-06 ENCOUNTER — Ambulatory Visit (INDEPENDENT_AMBULATORY_CARE_PROVIDER_SITE_OTHER): Payer: Medicare Other | Admitting: Podiatry

## 2021-10-06 ENCOUNTER — Other Ambulatory Visit: Payer: Self-pay

## 2021-10-06 DIAGNOSIS — M722 Plantar fascial fibromatosis: Secondary | ICD-10-CM | POA: Diagnosis not present

## 2021-10-06 DIAGNOSIS — S99912A Unspecified injury of left ankle, initial encounter: Secondary | ICD-10-CM

## 2021-10-06 DIAGNOSIS — M7752 Other enthesopathy of left foot: Secondary | ICD-10-CM

## 2021-10-06 MED ORDER — TRIAMCINOLONE ACETONIDE 10 MG/ML IJ SUSP
20.0000 mg | Freq: Once | INTRAMUSCULAR | Status: AC
  Administered 2021-10-06: 20 mg

## 2021-10-06 NOTE — Progress Notes (Signed)
Subjective:   Patient ID: Maria [name], female   DOB: 65 y.o.   MRN: [id number]   HPI Patient does not remember specific injury but states her left ankle has started to become very sore and its swollen if she is on it too much but states its more the pain she is experiencing with obesity is complicating factor   ROS      Objective:  Physical Exam  Neurovascular status intact with inflammation pain sinus tarsi left with fluid buildup negative Bevelyn Buckles' sign was noted     Assessment:  Inflammatory capsulitis sinus tarsi left     Plan:  H&P x-rays reviewed sterile prep and injected the sinus tarsi left 3 mg Kenalog 5 mg Xylocaine advised on immobilization compression therapy anti-inflammatories and reappoint to recheck  X-rays were negative for signs of fracture or bony contusion

## 2021-10-07 DIAGNOSIS — I890 Lymphedema, not elsewhere classified: Secondary | ICD-10-CM | POA: Diagnosis not present

## 2021-10-07 DIAGNOSIS — E1151 Type 2 diabetes mellitus with diabetic peripheral angiopathy without gangrene: Secondary | ICD-10-CM | POA: Diagnosis not present

## 2021-10-07 DIAGNOSIS — I110 Hypertensive heart disease with heart failure: Secondary | ICD-10-CM | POA: Diagnosis not present

## 2021-10-07 DIAGNOSIS — I5033 Acute on chronic diastolic (congestive) heart failure: Secondary | ICD-10-CM | POA: Diagnosis not present

## 2021-10-07 DIAGNOSIS — I872 Venous insufficiency (chronic) (peripheral): Secondary | ICD-10-CM | POA: Diagnosis not present

## 2021-10-08 ENCOUNTER — Telehealth: Payer: Self-pay | Admitting: Internal Medicine

## 2021-10-08 DIAGNOSIS — I872 Venous insufficiency (chronic) (peripheral): Secondary | ICD-10-CM | POA: Diagnosis not present

## 2021-10-08 DIAGNOSIS — E1151 Type 2 diabetes mellitus with diabetic peripheral angiopathy without gangrene: Secondary | ICD-10-CM | POA: Diagnosis not present

## 2021-10-08 DIAGNOSIS — I110 Hypertensive heart disease with heart failure: Secondary | ICD-10-CM | POA: Diagnosis not present

## 2021-10-08 DIAGNOSIS — I5033 Acute on chronic diastolic (congestive) heart failure: Secondary | ICD-10-CM | POA: Diagnosis not present

## 2021-10-08 DIAGNOSIS — I890 Lymphedema, not elsewhere classified: Secondary | ICD-10-CM | POA: Diagnosis not present

## 2021-10-08 NOTE — Progress Notes (Signed)
Cardiology Office Note   Date:  10/09/2021   ID:  Maria [name], DOB February 25, 1956, MRN [id number]  PCP:  Biagio Borg, MD  Cardiologist:   Minus Breeding, MD  Chief Complaint  Patient presents with   Shortness of Breath   Edema       History of Present Illness: Maria [name] is a 65 y.o. female who presents for follow up of acute on chronic diastolic dysfunction. She was in the hospital in late April with abdominal pain.  We saw her for acute on chronic diastolic dysfunction.  She had some mild acute on chronic renal insufficiency.   We stopped her metolazone and switched Lasix to torsemide.  She was in the ED on 5/12 because she thought that she was volume overloaded and she had gained some weight.  However, BNP and CXR and exam did not suggest acute volume overload.   Her last echocardiogram 03/08/21 showed EF 60-65%, G1DD, no RWMA, elevated LVEDP, and no significant valvular abnormalities. Does not appear to have had prior ischemic testing though does have evidence of prior coronary artery calcifications (specifically LAD) noted on prior CTA Chest in 2020.   Since we last did a video visit with her she was in the hospital earlier this month.  I reviewed these records for this visit.  She seemed to have some excess body fluid and was diuresed 13 L.  She was discharged home on 80 mg of torsemide twice daily.  She was treated with some antibiotics for possible cellulitis.  She returns for follow-up.  She is actually done relatively well since then.  She weighs herself routinely and her weights have been staying in the 3 8-360 range.  She thinks her fluid is down.  She is drinking about 64 ounces a day.  She is not having any new shortness of breath, PND or orthopnea.  Her lower extremity edema is fairly well controlled.  I do note that when she was in the hospital she had another echocardiogram with well-preserved ejection fraction.  She does walk with a walker.  She has chronic dyspnea  with exertion that is unchanged from previous.   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 2010   [place]  may 24th 2010   COLONOSCOPY WITH PROPOFOL N/A 07/10/2016   Procedure: COLONOSCOPY WITH PROPOFOL;  Surgeon: Manus Gunning, MD;  Location: [facility];  Service: Gastroenterology;  Laterality: N/A;   COLONOSCOPY WITH PROPOFOL N/A 09/26/2019   Procedure: COLONOSCOPY WITH PROPOFOL;  Surgeon: Yetta Flock, MD;  Location: [facility];  Service: Gastroenterology;  Laterality: N/A;  ESOPHAGOGASTRODUODENOSCOPY (EGD) WITH PROPOFOL N/A 07/10/2016   Procedure: ESOPHAGOGASTRODUODENOSCOPY (EGD) WITH PROPOFOL;  Surgeon: Manus Gunning, MD;  Location: [facility];  Service: Gastroenterology;  Laterality: N/A;   POLYPECTOMY  09/26/2019   Procedure: POLYPECTOMY;  Surgeon: Yetta Flock, MD;  Location: [facility];  Service: Gastroenterology;;   s/p ovary cyst     s/p right knee arthroscopy     Dr. Mardelle Matte ortho     Current Outpatient Medications  Medication Sig Dispense Refill   Accu-Chek Softclix Lancets lancets Use as directed up to 4 times daily (Patient taking  differently: 1 each by Other route in the morning, at noon, in the evening, and at bedtime.) 100 each 5   acetaminophen (TYLENOL) 325 MG tablet Take 650 mg by mouth every 6 (six) hours as needed for headache or fever (pain).     albuterol (PROAIR HFA) 108 (90 Base) MCG/ACT inhaler Inhale 2 puffs into the lungs every 6 (six) hours as needed for wheezing or shortness of breath. 1 each 0   Blood Glucose Monitoring Suppl (ACCU-CHEK GUIDE) w/Device KIT Use as directed (Patient taking differently: 1 each by Other route as directed.) 1 kit 0   Cholecalciferol (VITAMIN D3) 50 MCG (2000 UT) CAPS Take 2 capsules by mouth daily.     clonazePAM (KLONOPIN) 1 MG tablet TAKE 1 TABLET BY MOUTH 2 TIMES A DAY AS NEEDED FOR ANXIETY (Patient taking differently: Take 1 mg by mouth 2 (two) times daily as needed for anxiety.) 60 tablet 5   cyclobenzaprine (FLEXERIL) 10 MG tablet TAKE 1 TABLET BY MOUTH 2 TIMES A DAY AS NEEDED FOR MUSCLE SPASMS (Patient taking differently: Take 10 mg by mouth 2 (two) times daily as needed for muscle spasms.) 180 tablet 1   Fexofenadine HCl (ALLEGRA PO) Take 180 mg by mouth daily as needed (allergies).     glucose blood (ACCU-CHEK GUIDE) test strip Use as instructed up to 4 times daily (Patient taking differently: Use as instructed up to 4 times daily) 100 each 12   losartan (COZAAR) 50 MG tablet Take 1 tablet (50 mg total) by mouth daily. 90 tablet 3   Metamucil Fiber CHEW Chew 3 tablets by mouth at bedtime.     metoprolol tartrate (LOPRESSOR) 25 MG tablet Take 25 mg by mouth 2 (two) times daily.     NARCAN 4 MG/0.1ML LIQD nasal spray kit Place 0.4 mg into the nose daily as needed (opioid overdose).     ondansetron (ZOFRAN ODT) 4 MG disintegrating tablet Take 1 tablet (4 mg total) by mouth every 4 (four) hours as needed for nausea or vomiting. 20 tablet 0   Oxycodone HCl 10 MG TABS Take 1 tablet (10 mg total) 4 (four) times daily by mouth. Per [facility] (Patient taking differently:  Take 10 mg by mouth every 4 (four) hours as needed (pain). Per [facility]) 30 tablet 0   pantoprazole (PROTONIX) 40 MG tablet TAKE ONE TABLET BY MOUTH DAILY (Patient taking differently: Take 40 mg by mouth daily.) 90 tablet 2   Polyethyl Glycol-Propyl Glycol 0.4-0.3 % SOLN Apply 1 drop to eye daily as needed (dry eyes).     rOPINIRole (REQUIP) 1 MG tablet Take 1 tablet (1 mg total) by mouth 3 (three) times daily. 90 tablet 5   Simethicone (GAS-X EXTRA STRENGTH PO) Take 1 tablet by mouth daily as needed (gas).     torsemide (DEMADEX) 20 MG tablet Take 20 mg  4 tablets ( 80 mg ) twice  a day.May take a extra 20 mg daily if weight over 370 lbs. 180 tablet 3   metFORMIN (GLUCOPHAGE) 500 MG tablet Take 1 tablet (500 mg total) by mouth 2 (two) times daily with a meal. (Patient not taking: Reported on 10/09/2021) 60 tablet 1   polyethylene glycol powder (GLYCOLAX/MIRALAX) 17 GM/SCOOP powder Take 17 g by mouth daily as needed for mild constipation. (Patient not taking: No sig reported) 510 g 0   potassium chloride SA (KLOR-CON) 20 MEQ tablet Take 1 tablet (20 mEq total) by mouth 2 (two) times daily. 180 tablet 3   No current facility-administered medications for this visit.    Allergies:   Amoxicillin-pot clavulanate, Adhesive [tape], Codeine, Crestor [rosuvastatin calcium], Morphine and related, Vicodin [hydrocodone-acetaminophen], and Latex    ROS:  Please see the history of present illness.   Otherwise, review of systems are positive for constipation and cramping.   All other systems are reviewed and negative.    PHYSICAL EXAM: VS:  BP 120/62 (BP Location: Left Arm)   Pulse 62   Ht _0  (1.651 m)   Wt (!) 361 lb 9.6 oz (164 kg)   SpO2 96%   BMI 60.17 kg/m  , BMI Body mass index is 60.17 kg/m. GEN:  No distress NECK:  No jugular venous distention at 90 degrees, waveform within normal limits, carotid upstroke brisk and symmetric, no bruits, no thyromegaly LYMPHATICS:  No cervical  adenopathy LUNGS:  Clear to auscultation bilaterally BACK:  No CVA tenderness CHEST:  Unremarkable HEART:  S1 and S2 within normal limits, no S3, no S4, no clicks, no rubs, no murmurs ABD:  Positive bowel sounds normal in frequency in pitch, no bruits, no rebound, no guarding, unable to assess midline mass or bruit with the patient seated. EXT:  2 plus pulses throughout, moderate edema, no cyanosis no clubbing SKIN:  No rashes no nodules NEURO:  Cranial nerves II through XII grossly intact, motor grossly intact throughout PSYCH:  Cognitively intact, oriented to person place and time   EKG:  EKG is ordered today. The ekg ordered today demonstrates sinus rhythm, rate 62, axis within normal limits, low voltage in the chest leads, poor anterior R wave progression, nonspecific ST-T wave changes.   Recent Labs: 12/31/2020: TSH 1.45 04/11/2021: Magnesium 2.1 09/19/2021: B Natriuretic Peptide 34.1 09/20/2021: ALT 14; Hemoglobin 12.0; Platelets 228 09/25/2021: BUN 18; Creatinine, Ser 1.19; Potassium 4.3; Sodium 144    Lipid Panel    Component Value Date/Time   CHOL 147 07/07/2021 1510   TRIG 140.0 07/07/2021 1510   TRIG 126 12/31/2006 0847   HDL 43.30 07/07/2021 1510   CHOLHDL 3 07/07/2021 1510   VLDL 28.0 07/07/2021 1510   LDLCALC 76 07/07/2021 1510   LDLDIRECT 127.0 01/25/2019 1423      Wt Readings from Last 3 Encounters:  10/09/21 (!) 361 lb 9.6 oz (164 kg)  09/25/21 (!) 358 lb 0.4 oz (162.4 kg)  09/19/21 (!) 383 lb (173.7 kg)      Other studies Reviewed: Additional studies/ records that were reviewed today include: Hospital records. Review of the above records demonstrates:  Please see elsewhere in the note.     ASSESSMENT AND PLAN:   Acute on chronic diastolic CHF I think she is at a good point with her volume and I would like for her to continue on the meds as listed.  She does have some constipation and cramping I will check a basic metabolic profile and magnesium.  I am  going to suggest Colace 100 twice daily but we might have to go down on her diuretic based on further symptoms or blood work.  Essential hypertension  : Occasionally her blood pressure has been running lower but we talked about this and she is tolerating the meds as listed.  Apparently her Cozaar was reduced during her hospitalization and she will remain on the meds as listed.   Hyperlipidemia : LDL is 76 with an HDL of 43.  No change in therapy.    Type 2 diabetes : A1c was 6.1.  Continue meds as listed.  Current medicines are reviewed at length with the patient today.  The patient does not have concerns regarding medicines.  The following changes have been made:  no change  Labs/ tests ordered today include:   Orders Placed This Encounter  Procedures   Basic metabolic panel   Magnesium   EKG 12-Lead      Disposition:   FU with APP in 3 months.     Signed, Minus Breeding, MD  10/09/2021 2:45 PM    [facility]

## 2021-10-08 NOTE — Telephone Encounter (Signed)
[place] Name: [facility] Agency Name: Myrtie Cruise Phone #: [phone] Service Requested: PT  (examples: OT/PT/Skilled Nursing/Social Work/Speech Therapy/Wound Care) Frequency of Visits: 2x for 3wk, 1x for 3wk (starting next week 10/13/2021)

## 2021-10-09 ENCOUNTER — Encounter: Payer: Self-pay | Admitting: Cardiology

## 2021-10-09 ENCOUNTER — Ambulatory Visit (INDEPENDENT_AMBULATORY_CARE_PROVIDER_SITE_OTHER): Payer: Medicare Other | Admitting: Cardiology

## 2021-10-09 ENCOUNTER — Other Ambulatory Visit: Payer: Self-pay

## 2021-10-09 VITALS — BP 120/62 | HR 62 | Ht 65.0 in | Wt 361.6 lb

## 2021-10-09 DIAGNOSIS — E118 Type 2 diabetes mellitus with unspecified complications: Secondary | ICD-10-CM

## 2021-10-09 DIAGNOSIS — E785 Hyperlipidemia, unspecified: Secondary | ICD-10-CM | POA: Diagnosis not present

## 2021-10-09 DIAGNOSIS — I5033 Acute on chronic diastolic (congestive) heart failure: Secondary | ICD-10-CM

## 2021-10-09 DIAGNOSIS — I872 Venous insufficiency (chronic) (peripheral): Secondary | ICD-10-CM | POA: Diagnosis not present

## 2021-10-09 DIAGNOSIS — I890 Lymphedema, not elsewhere classified: Secondary | ICD-10-CM | POA: Diagnosis not present

## 2021-10-09 DIAGNOSIS — I10 Essential (primary) hypertension: Secondary | ICD-10-CM | POA: Diagnosis not present

## 2021-10-09 DIAGNOSIS — I110 Hypertensive heart disease with heart failure: Secondary | ICD-10-CM | POA: Diagnosis not present

## 2021-10-09 DIAGNOSIS — E1151 Type 2 diabetes mellitus with diabetic peripheral angiopathy without gangrene: Secondary | ICD-10-CM | POA: Diagnosis not present

## 2021-10-09 NOTE — Patient Instructions (Addendum)
Medication Instructions:  You may take over the counter Colace 100 mg twice daily  *If you need a refill on your cardiac medications before your next appointment, please call your pharmacy*   Lab Work: Your provider would like for you to have the following labs today: BMET and Magnesium  If you have labs (blood work) drawn today and your tests are completely normal, you will receive your results only by: [place] (if you have MyChart) OR A paper copy in the mail If you have any lab test that is abnormal or we need to change your treatment, we will call you to review the results.   Testing/Procedures: None ordered   Follow-Up: At [facility], you and your health needs are our priority.  As part of our continuing mission to provide you with exceptional heart care, we have created designated Provider Care Teams.  These Care Teams include your primary Cardiologist (physician) and Advanced Practice Providers (APPs -  Physician Assistants and Nurse Practitioners) who all work together to provide you with the care you need, when you need it.  We recommend signing up for the patient portal called "MyChart".  Sign up information is provided on this After Visit Summary.  MyChart is used to connect with patients for Virtual Visits (Telemedicine).  Patients are able to view lab/test results, encounter notes, upcoming appointments, etc.  Non-urgent messages can be sent to your provider as well.   To learn more about what you can do with MyChart, go to [email or web address].    Your next appointment:   4 month(s)  The format for your next appointment:   In Person  Provider:   You will see one of the following Advanced Practice Providers on your designated Care Team:   Rosaria Ferries, PA-C Caron Presume, PA-C Jory Sims, DNP, ANP

## 2021-10-09 NOTE — Telephone Encounter (Signed)
Left message for Liji to call me back

## 2021-10-09 NOTE — Telephone Encounter (Signed)
Ok for verbals 

## 2021-10-10 LAB — BASIC METABOLIC PANEL
BUN/Creatinine Ratio: 12 (ref 12–28)
BUN: 13 mg/dL (ref 8–27)
CO2: 31 mmol/L — ABNORMAL HIGH (ref 20–29)
Calcium: 9.1 mg/dL (ref 8.7–10.3)
Chloride: 96 mmol/L (ref 96–106)
Creatinine, Ser: 1.07 mg/dL — ABNORMAL HIGH (ref 0.57–1.00)
Glucose: 77 mg/dL (ref 70–99)
Potassium: 3.4 mmol/L — ABNORMAL LOW (ref 3.5–5.2)
Sodium: 142 mmol/L (ref 134–144)
eGFR: 58 mL/min/{1.73_m2} — ABNORMAL LOW (ref >59)

## 2021-10-10 LAB — MAGNESIUM: Magnesium: 1.8 mg/dL (ref 1.6–2.3)

## 2021-10-10 NOTE — Telephone Encounter (Signed)
Verbals given  

## 2021-10-10 NOTE — Telephone Encounter (Signed)
Ok for verbals 

## 2021-10-10 NOTE — Telephone Encounter (Signed)
[place] Name: [facility] Agency Name: Myrtie Cruise Phone #: [phone] Service Requested: requesting verbal approval for weekly nursing visits for 9 weeks, 2 prn visits for cardic or diabetic complications

## 2021-10-13 DIAGNOSIS — E1151 Type 2 diabetes mellitus with diabetic peripheral angiopathy without gangrene: Secondary | ICD-10-CM | POA: Diagnosis not present

## 2021-10-13 DIAGNOSIS — I890 Lymphedema, not elsewhere classified: Secondary | ICD-10-CM | POA: Diagnosis not present

## 2021-10-13 DIAGNOSIS — I5033 Acute on chronic diastolic (congestive) heart failure: Secondary | ICD-10-CM | POA: Diagnosis not present

## 2021-10-13 DIAGNOSIS — I872 Venous insufficiency (chronic) (peripheral): Secondary | ICD-10-CM | POA: Diagnosis not present

## 2021-10-13 DIAGNOSIS — I110 Hypertensive heart disease with heart failure: Secondary | ICD-10-CM | POA: Diagnosis not present

## 2021-10-14 DIAGNOSIS — I872 Venous insufficiency (chronic) (peripheral): Secondary | ICD-10-CM | POA: Diagnosis not present

## 2021-10-14 DIAGNOSIS — I890 Lymphedema, not elsewhere classified: Secondary | ICD-10-CM | POA: Diagnosis not present

## 2021-10-14 DIAGNOSIS — I5033 Acute on chronic diastolic (congestive) heart failure: Secondary | ICD-10-CM | POA: Diagnosis not present

## 2021-10-14 DIAGNOSIS — I110 Hypertensive heart disease with heart failure: Secondary | ICD-10-CM | POA: Diagnosis not present

## 2021-10-14 DIAGNOSIS — E1151 Type 2 diabetes mellitus with diabetic peripheral angiopathy without gangrene: Secondary | ICD-10-CM | POA: Diagnosis not present

## 2021-10-16 ENCOUNTER — Other Ambulatory Visit: Payer: Self-pay

## 2021-10-16 ENCOUNTER — Encounter (HOSPITAL_BASED_OUTPATIENT_CLINIC_OR_DEPARTMENT_OTHER): Payer: Self-pay | Admitting: Obstetrics and Gynecology

## 2021-10-16 ENCOUNTER — Telehealth: Payer: Self-pay | Admitting: Internal Medicine

## 2021-10-16 DIAGNOSIS — E119 Type 2 diabetes mellitus without complications: Secondary | ICD-10-CM | POA: Insufficient documentation

## 2021-10-16 DIAGNOSIS — I130 Hypertensive heart and chronic kidney disease with heart failure and stage 1 through stage 4 chronic kidney disease, or unspecified chronic kidney disease: Secondary | ICD-10-CM | POA: Insufficient documentation

## 2021-10-16 DIAGNOSIS — N1831 Chronic kidney disease, stage 3a: Secondary | ICD-10-CM | POA: Insufficient documentation

## 2021-10-16 DIAGNOSIS — Z87891 Personal history of nicotine dependence: Secondary | ICD-10-CM | POA: Insufficient documentation

## 2021-10-16 DIAGNOSIS — Z743 Need for continuous supervision: Secondary | ICD-10-CM | POA: Diagnosis not present

## 2021-10-16 DIAGNOSIS — I110 Hypertensive heart disease with heart failure: Secondary | ICD-10-CM | POA: Diagnosis not present

## 2021-10-16 DIAGNOSIS — I872 Venous insufficiency (chronic) (peripheral): Secondary | ICD-10-CM | POA: Diagnosis not present

## 2021-10-16 DIAGNOSIS — Z79899 Other long term (current) drug therapy: Secondary | ICD-10-CM | POA: Insufficient documentation

## 2021-10-16 DIAGNOSIS — I5033 Acute on chronic diastolic (congestive) heart failure: Secondary | ICD-10-CM | POA: Insufficient documentation

## 2021-10-16 DIAGNOSIS — Z91040 Latex allergy status: Secondary | ICD-10-CM | POA: Diagnosis not present

## 2021-10-16 DIAGNOSIS — K219 Gastro-esophageal reflux disease without esophagitis: Secondary | ICD-10-CM | POA: Insufficient documentation

## 2021-10-16 DIAGNOSIS — N200 Calculus of kidney: Secondary | ICD-10-CM | POA: Diagnosis not present

## 2021-10-16 DIAGNOSIS — R6889 Other general symptoms and signs: Secondary | ICD-10-CM | POA: Diagnosis not present

## 2021-10-16 DIAGNOSIS — I890 Lymphedema, not elsewhere classified: Secondary | ICD-10-CM | POA: Diagnosis not present

## 2021-10-16 DIAGNOSIS — R109 Unspecified abdominal pain: Secondary | ICD-10-CM | POA: Diagnosis not present

## 2021-10-16 DIAGNOSIS — R0902 Hypoxemia: Secondary | ICD-10-CM | POA: Diagnosis not present

## 2021-10-16 DIAGNOSIS — R1084 Generalized abdominal pain: Secondary | ICD-10-CM | POA: Insufficient documentation

## 2021-10-16 DIAGNOSIS — Z7984 Long term (current) use of oral hypoglycemic drugs: Secondary | ICD-10-CM | POA: Diagnosis not present

## 2021-10-16 DIAGNOSIS — R1012 Left upper quadrant pain: Secondary | ICD-10-CM | POA: Diagnosis not present

## 2021-10-16 DIAGNOSIS — R1032 Left lower quadrant pain: Secondary | ICD-10-CM | POA: Diagnosis not present

## 2021-10-16 DIAGNOSIS — E1151 Type 2 diabetes mellitus with diabetic peripheral angiopathy without gangrene: Secondary | ICD-10-CM | POA: Diagnosis not present

## 2021-10-16 LAB — CBC WITH DIFFERENTIAL/PLATELET
Abs Immature Granulocytes: 0.06 10*3/uL (ref 0.00–0.07)
Basophils Absolute: 0.1 10*3/uL (ref 0.0–0.1)
Basophils Relative: 1 %
Eosinophils Absolute: 0.2 10*3/uL (ref 0.0–0.5)
Eosinophils Relative: 2 %
HCT: 38.5 % (ref 36.0–46.0)
Hemoglobin: 12.3 g/dL (ref 12.0–15.0)
Immature Granulocytes: 1 %
Lymphocytes Relative: 33 %
Lymphs Abs: 3.8 10*3/uL (ref 0.7–4.0)
MCH: 28.9 pg (ref 26.0–34.0)
MCHC: 31.9 g/dL (ref 30.0–36.0)
MCV: 90.4 fL (ref 80.0–100.0)
Monocytes Absolute: 0.8 10*3/uL (ref 0.1–1.0)
Monocytes Relative: 7 %
Neutro Abs: 6.5 10*3/uL (ref 1.7–7.7)
Neutrophils Relative %: 56 %
Platelets: 288 10*3/uL (ref 150–400)
RBC: 4.26 MIL/uL (ref 3.87–5.11)
RDW: 13 % (ref 11.5–15.5)
WBC: 11.5 10*3/uL — ABNORMAL HIGH (ref 4.0–10.5)
nRBC: 0 % (ref 0.0–0.2)

## 2021-10-16 LAB — COMPREHENSIVE METABOLIC PANEL
ALT: 11 U/L (ref 0–44)
AST: 10 U/L — ABNORMAL LOW (ref 15–41)
Albumin: 4 g/dL (ref 3.5–5.0)
Alkaline Phosphatase: 75 U/L (ref 38–126)
Anion gap: 6 (ref 5–15)
BUN: 18 mg/dL (ref 8–23)
CO2: 36 mmol/L — ABNORMAL HIGH (ref 22–32)
Calcium: 9 mg/dL (ref 8.9–10.3)
Chloride: 97 mmol/L — ABNORMAL LOW (ref 98–111)
Creatinine, Ser: 1.18 mg/dL — ABNORMAL HIGH (ref 0.44–1.00)
GFR, Estimated: 52 mL/min — ABNORMAL LOW (ref >60)
Glucose, Bld: 95 mg/dL (ref 70–99)
Potassium: 3.6 mmol/L (ref 3.5–5.1)
Sodium: 139 mmol/L (ref 135–145)
Total Bilirubin: 0.6 mg/dL (ref 0.3–1.2)
Total Protein: 7.1 g/dL (ref 6.5–8.1)

## 2021-10-16 LAB — LIPASE, BLOOD: Lipase: 21 U/L (ref 11–51)

## 2021-10-16 LAB — BRAIN NATRIURETIC PEPTIDE: B Natriuretic Peptide: 32 pg/mL (ref 0.0–100.0)

## 2021-10-16 NOTE — Telephone Encounter (Signed)
Jill physical therapist asst w/ [facility] calling to inform patient's blood pressure is 99/60  Sharee Pimple states patient was weak but was not disoriented, patient did not have any other symptoms  Transferred to [facility]

## 2021-10-16 NOTE — ED Triage Notes (Signed)
Patient presents to the ER for abdominal pain and reportedly having low blood pressure in the mornings. Patient's BP with staff is WDL. Patient states the pain her abdomen makes her feel like it is swollen.

## 2021-10-17 ENCOUNTER — Emergency Department (HOSPITAL_BASED_OUTPATIENT_CLINIC_OR_DEPARTMENT_OTHER): Payer: Medicare Other

## 2021-10-17 ENCOUNTER — Encounter (HOSPITAL_BASED_OUTPATIENT_CLINIC_OR_DEPARTMENT_OTHER): Payer: Self-pay | Admitting: Radiology

## 2021-10-17 ENCOUNTER — Emergency Department (HOSPITAL_BASED_OUTPATIENT_CLINIC_OR_DEPARTMENT_OTHER)
Admission: EM | Admit: 2021-10-17 | Discharge: 2021-10-17 | Disposition: A | Discharge status: Home / Self Care | Payer: Medicare Other | Attending: Emergency Medicine | Admitting: Emergency Medicine

## 2021-10-17 DIAGNOSIS — R1084 Generalized abdominal pain: Secondary | ICD-10-CM

## 2021-10-17 DIAGNOSIS — R109 Unspecified abdominal pain: Secondary | ICD-10-CM | POA: Diagnosis not present

## 2021-10-17 MED ORDER — HYDROMORPHONE HCL 1 MG/ML IJ SOLN
0.5000 mg | Freq: Once | INTRAMUSCULAR | Status: AC
  Administered 2021-10-17: 0.5 mg via INTRAVENOUS
  Filled 2021-10-17: qty 1

## 2021-10-17 MED ORDER — IOHEXOL 300 MG/ML  SOLN
100.0000 mL | Freq: Once | INTRAMUSCULAR | Status: AC | PRN
  Administered 2021-10-17: 100 mL via INTRAVENOUS

## 2021-10-17 MED ORDER — FENTANYL CITRATE PF 50 MCG/ML IJ SOSY
50.0000 ug | PREFILLED_SYRINGE | Freq: Once | INTRAMUSCULAR | Status: AC
Start: 2021-10-17 — End: 2021-10-17
  Administered 2021-10-17: 50 ug via INTRAVENOUS
  Filled 2021-10-17: qty 1

## 2021-10-17 MED ORDER — FUROSEMIDE 10 MG/ML IJ SOLN
80.0000 mg | Freq: Once | INTRAMUSCULAR | Status: AC
  Administered 2021-10-17: 80 mg via INTRAVENOUS
  Filled 2021-10-17: qty 8

## 2021-10-17 NOTE — ED Provider Notes (Signed)
[facility] EMERGENCY DEPT Provider Note   CSN: [id number] Arrival date & time: 10/16/21  1919     History Chief Complaint  Patient presents with   Abdominal Pain    Maria [name] is a 65 y.o. female.   Abdominal Pain Pain location:  LUQ and LLQ Pain quality: aching and sharp   Pain radiates to:  Does not radiate Pain severity:  Mild Timing:  Constant Context: not alcohol use, not awakening from sleep and not retching   Relieved by:  None tried Worsened by:  Nothing Ineffective treatments:  None tried Associated symptoms: no anorexia, no dysuria and no melena       Past Medical History:  Diagnosis Date   Acute lymphadenitis 2011   ALLERGIC RHINITIS 08/10/2007   ANXIETY 12/06/2007   Atherosclerotic peripheral vascular disease ([place]) 06/13/2013   Aorta on CT June 2014   Cellulitis 2011   Cervical disc disease 03/09/2012   CHF (congestive heart failure) ([place])    Chronic pain 03/09/2012   DEPRESSION 12/06/2007   Diabetes ([place])    DIVERTICULOSIS, [place] 12/06/2007   GERD 12/06/2007   Hepatitis    age 46 hepatitis A   HLD (hyperlipidemia) 05/24/2019   HYPERTENSION 12/06/2007   Lumbar disc disease 03/09/2012   Mesenteric adenitis    MRSA 2006   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   THORACIC/LUMBOSACRAL NEURITIS/RADICULITIS UNSPEC 12/28/2008    Patient Active Problem List   Diagnosis Date Noted   Hypernatremia 09/24/2021   Cellulitis 09/24/2021   CKD (chronic kidney disease), stage IIIa ([place]) 09/24/2021   Sacral decubitus ulcer, stage II ([place]) 09/20/2021   Acute on chronic diastolic CHF (congestive heart failure) ([place]) 09/19/2021   Nail disorder 07/07/2021   Pre-ulcerative corn or callous 07/07/2021   Rash 48/12/6551   Diastolic congestive heart failure ([place]) 03/17/2021   Diabetes mellitus type 2 in obese ([place]) 03/11/2021   Vitamin D deficiency 01/01/2021   Medial epicondylitis 12/31/2020   Aortic atherosclerosis ([place]) 74/82/7078   Umbilical  hernia 67/54/4920   Constipation 08/05/2020   Arthritis 06/12/2020   Hoarseness 05/01/2020   Vitamin B 12 deficiency 01/31/2020   Rosacea 01/31/2020   Neck swelling 01/31/2020   History of colonic polyps    Benign neoplasm of [place]    Nausea 08/31/2019   Throat pain 07/26/2019   HLD (hyperlipidemia) 05/24/2019   Leg cramps 05/24/2019   Lump in neck 05/24/2019   Left thyroid nodule 01/25/2019   Jerking movements of extremities 07/20/2018   Balance disorder 07/20/2018   Right knee pain 03/21/2018   OSA (obstructive sleep apnea) 03/21/2018   Oxygen desaturation 03/21/2018   Urinary symptom or sign 03/21/2018   Acquired lymphedema 01/25/2018   Gait disorder 01/25/2018   Peripheral edema 01/16/2018   SOB (shortness of breath) 01/15/2018   Dysphonia 12/07/2017   Encounter for well adult exam with abnormal findings 11/08/2017   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[place]) 05/31/2017   Stacks polyp 08/11/2016   Abdominal pain, epigastric    Dysphagia    [place] cancer screening    ACE-inhibitor cough 12/05/2015   Restless legs syndrome 10/08/2015   Venous stasis dermatitis of both lower extremities 04/26/2015   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/17/2014   AR (allergic rhinitis) 10/17/2014   Fatty liver 10/17/2014   Chronic pain syndrome 09/19/2014   Post-traumatic osteoarthritis of both knees 09/19/2014   Spinal stenosis 09/19/2014   Depression, major, single episode, complete remission ([place]) 09/19/2014   Narcotic dependence ([place]) 09/19/2014   Spondylosis of lumbar region  without myelopathy or radiculopathy 03/09/2012   Lumbar disc disease 03/09/2012   Chronic pain 03/09/2012   Morbid obesity ([place]) 06/17/2011   Chronic low back pain 12/28/2008   Anxiety state 12/06/2007   Depression 12/06/2007   Essential hypertension 12/06/2007   Gastroesophageal reflux disease without esophagitis 12/06/2007   LOW BACK PAIN 12/06/2007    Past Surgical History:  Procedure Laterality Date   ABDOMINAL  HYSTERECTOMY  1999   1 ovary left   bloo clot removed from neck   may 25th , june 2. 2010   [place]  may 24th 2010   COLONOSCOPY WITH PROPOFOL N/A 07/10/2016   Procedure: COLONOSCOPY WITH PROPOFOL;  Surgeon: Manus Gunning, MD;  Location: [facility];  Service: Gastroenterology;  Laterality: N/A;   COLONOSCOPY WITH PROPOFOL N/A 09/26/2019   Procedure: COLONOSCOPY WITH PROPOFOL;  Surgeon: Yetta Flock, MD;  Location: [facility];  Service: Gastroenterology;  Laterality: N/A;   ESOPHAGOGASTRODUODENOSCOPY (EGD) WITH PROPOFOL N/A 07/10/2016   Procedure: ESOPHAGOGASTRODUODENOSCOPY (EGD) WITH PROPOFOL;  Surgeon: Manus Gunning, MD;  Location: [facility];  Service: Gastroenterology;  Laterality: N/A;   POLYPECTOMY  09/26/2019   Procedure: POLYPECTOMY;  Surgeon: Yetta Flock, MD;  Location: [facility];  Service: Gastroenterology;;   s/p ovary cyst     s/p right knee arthroscopy     Dr. Mardelle Matte ortho     OB History   No obstetric history on file.     Family History  Problem Relation Age of Onset   Heart disease Mother    Hypertension Mother    Diabetes Mother    Heart failure Mother    Asthma Sister    Anxiety disorder Sister    Depression Sister    Hypertension Father    Asthma Daughter    Bipolar disorder Daughter    Cancer Maternal Uncle        [place]   Cancer Other        ovarian   Liver cancer Paternal Grandmother        ????    Social History   Tobacco Use   Smoking status: Former    Years: 30.00    Types: Cigarettes    Quit date: 11/08/2008    Years since quitting: 12.9   Smokeless tobacco: Never   Tobacco comments:    quit 10/09  Vaping Use   Vaping Use: Never used  Substance Use Topics   Alcohol use: No    Comment: quit drinking 10/07/1997   Drug use: No    Home Medications Prior to Admission medications   Medication Sig Start Date End Date Taking? Authorizing Provider  Accu-Chek Softclix Lancets lancets Use  as directed up to 4 times daily Patient taking differently: 1 each by Other route in the morning, at noon, in the evening, and at bedtime. 04/11/21   Elodia Florence., MD  acetaminophen (TYLENOL) 325 MG tablet Take 650 mg by mouth every 6 (six) hours as needed for headache or fever (pain).    [provider]  albuterol (PROAIR HFA) 108 (90 Base) MCG/ACT inhaler Inhale 2 puffs into the lungs every 6 (six) hours as needed for wheezing or shortness of breath. 11/05/20   Lucretia Kern, DO  Blood Glucose Monitoring Suppl (ACCU-CHEK GUIDE) w/Device KIT Use as directed Patient taking differently: 1 each by Other route as directed. 04/11/21   Elodia Florence., MD  Cholecalciferol (VITAMIN D3) 50 MCG (2000 UT) CAPS Take 2 capsules by mouth  daily.    [provider]  clonazePAM (KLONOPIN) 1 MG tablet TAKE 1 TABLET BY MOUTH 2 TIMES A DAY AS NEEDED FOR ANXIETY Patient taking differently: Take 1 mg by mouth 2 (two) times daily as needed for anxiety. 12/10/20   Biagio Borg, MD  cyclobenzaprine (FLEXERIL) 10 MG tablet TAKE 1 TABLET BY MOUTH 2 TIMES A DAY AS NEEDED FOR MUSCLE SPASMS Patient taking differently: Take 10 mg by mouth 2 (two) times daily as needed for muscle spasms. 02/27/21   Biagio Borg, MD  Fexofenadine HCl (ALLEGRA PO) Take 180 mg by mouth daily as needed (allergies).    [provider]  glucose blood (ACCU-CHEK GUIDE) test strip Use as instructed up to 4 times daily Patient taking differently: Use as instructed up to 4 times daily 04/11/21   Elodia Florence., MD  losartan (COZAAR) 50 MG tablet Take 1 tablet (50 mg total) by mouth daily. 09/09/21   Almyra Deforest, PA  Metamucil Fiber CHEW Chew 3 tablets by mouth at bedtime.    [provider]  metFORMIN (GLUCOPHAGE) 500 MG tablet Take 1 tablet (500 mg total) by mouth 2 (two) times daily with a meal. Patient not taking: Reported on 10/09/2021 09/24/21 11/23/21  Biagio Borg, MD  metoprolol tartrate  (LOPRESSOR) 25 MG tablet Take 25 mg by mouth 2 (two) times daily.    [provider]  NARCAN 4 MG/0.1ML LIQD nasal spray kit Place 0.4 mg into the nose daily as needed (opioid overdose). 04/25/19   [provider]  ondansetron (ZOFRAN ODT) 4 MG disintegrating tablet Take 1 tablet (4 mg total) by mouth every 4 (four) hours as needed for nausea or vomiting. 08/23/19   Charlesetta Shanks, MD  Oxycodone HCl 10 MG TABS Take 1 tablet (10 mg total) 4 (four) times daily by mouth. Per [facility] Patient taking differently: Take 10 mg by mouth every 4 (four) hours as needed (pain). Per [facility] 11/08/17   Biagio Borg, MD  pantoprazole (PROTONIX) 40 MG tablet TAKE ONE TABLET BY MOUTH DAILY Patient taking differently: Take 40 mg by mouth daily. 03/03/21   Biagio Borg, MD  Polyethyl Glycol-Propyl Glycol 0.4-0.3 % SOLN Apply 1 drop to eye daily as needed (dry eyes).    [provider]  polyethylene glycol powder (GLYCOLAX/MIRALAX) 17 GM/SCOOP powder Take 17 g by mouth daily as needed for mild constipation. Patient not taking: No sig reported 04/11/21   Elodia Florence., MD  potassium chloride SA (KLOR-CON) 20 MEQ tablet Take 1 tablet (20 mEq total) by mouth 2 (two) times daily. 06/05/21 09/09/21  Minus Breeding, MD  rOPINIRole (REQUIP) 1 MG tablet Take 1 tablet (1 mg total) by mouth 3 (three) times daily. 07/07/21   Biagio Borg, MD  Simethicone (GAS-X EXTRA STRENGTH PO) Take 1 tablet by mouth daily as needed (gas).    [provider]  torsemide (DEMADEX) 20 MG tablet Take 20 mg  4 tablets ( 80 mg ) twice a day.May take a extra 20 mg daily if weight over 370 lbs. 09/11/21   Almyra Deforest, PA  DULoxetine (CYMBALTA) 60 MG capsule Take 60 mg by mouth daily.    03/09/12  [provider]    Allergies    Amoxicillin-pot clavulanate, Adhesive [tape], Codeine, Crestor [rosuvastatin calcium], Morphine and related, Vicodin [hydrocodone-acetaminophen], and  Latex  Review of Systems   Review of Systems  Gastrointestinal:  Positive for abdominal pain. Negative for  anorexia and melena.  Genitourinary:  Negative for dysuria.  All other systems reviewed and are negative.  Physical Exam Updated Vital Signs BP (!) 127/58   Pulse 64   Temp 98.3 F (36.8 C) (Oral)   Resp 18   SpO2 99%   Physical Exam Vitals and nursing note reviewed.  Constitutional:      Appearance: She is well-developed.  HENT:     Head: Normocephalic and atraumatic.     Mouth/Throat:     Mouth: Mucous membranes are moist.     Pharynx: Oropharynx is clear.  Eyes:     Pupils: Pupils are equal, round, and reactive to light.  Cardiovascular:     Rate and Rhythm: Normal rate and regular rhythm.  Pulmonary:     Effort: No respiratory distress.     Breath sounds: No stridor. No wheezing.  Abdominal:     General: There is no distension or abdominal bruit.     Tenderness: There is abdominal tenderness in the left upper quadrant and left lower quadrant.  Musculoskeletal:     Cervical back: Normal range of motion.  Skin:    General: Skin is warm and dry.  Neurological:     General: No focal deficit present.     Mental Status: She is alert.    ED Results / Procedures / Treatments   Labs (all labs ordered are listed, but only abnormal results are displayed) Labs Reviewed  CBC WITH DIFFERENTIAL/PLATELET - Abnormal; Notable for the following components:      Result Value   WBC 11.5 (*)    All other components within normal limits  COMPREHENSIVE METABOLIC PANEL - Abnormal; Notable for the following components:   Chloride 97 (*)    CO2 36 (*)    Creatinine, Ser 1.18 (*)    AST 10 (*)    GFR, Estimated 52 (*)    All other components within normal limits  BRAIN NATRIURETIC PEPTIDE  LIPASE, BLOOD    EKG None  Radiology CT ABDOMEN PELVIS W CONTRAST  Result Date: 10/17/2021 CLINICAL DATA:  Abdominal pain. EXAM: CT ABDOMEN AND PELVIS WITH CONTRAST TECHNIQUE:  Multidetector CT imaging of the abdomen and pelvis was performed using the standard protocol following bolus administration of intravenous contrast. CONTRAST:  165m OMNIPAQUE IOHEXOL 300 MG/ML  SOLN COMPARISON:  September 13, 2020 FINDINGS: Lower chest: No acute abnormality. Hepatobiliary: No focal liver abnormality is seen. No gallstones, gallbladder wall thickening, or biliary dilatation. Pancreas: Unremarkable. No pancreatic ductal dilatation or surrounding inflammatory changes. Spleen: Normal in size without focal abnormality. Adrenals/Urinary Tract: Adrenal glands are unremarkable. Kidneys are normal, without renal calculi, focal lesion, or hydronephrosis. Bladder is unremarkable. Stomach/Bowel: Stomach is within normal limits. Appendix appears normal. No evidence of bowel wall thickening, distention, or inflammatory changes. Noninflamed diverticula are seen within the proximal sigmoid [place]. Vascular/Lymphatic: Aortic atherosclerosis. No enlarged abdominal or pelvic lymph nodes. Reproductive: Status post hysterectomy. No adnexal masses. Other: A 3.2 cm x 3.3 cm fat containing umbilical hernia is noted. No abdominopelvic ascites. Musculoskeletal: No acute or significant osseous findings. IMPRESSION: 1. Sigmoid diverticulosis. 2. Fat-containing umbilical hernia. Aortic Atherosclerosis (ICD10-I70.0). Electronically Signed   By: TVirgina NorfolkM.D.   On: 10/17/2021 02:58    Procedures Procedures   Medications Ordered in ED Medications  iohexol (OMNIPAQUE) 300 MG/ML solution 100 mL (100 mLs Intravenous Contrast Given 10/17/21 0246)  fentaNYL (SUBLIMAZE) injection 50 mcg (50 mcg Intravenous Given 10/17/21 0230)  furosemide (LASIX) injection 80 mg (80 mg Intravenous Given  10/17/21 0229)  HYDROmorphone (DILAUDID) injection 0.5 mg (0.5 mg Intravenous Given 10/17/21 8182)    ED Course  I have reviewed the triage vital signs and the nursing notes.  Pertinent labs & imaging results that were available  during my care of the patient were reviewed by me and considered in my medical decision making (see chart for details).    MDM Rules/Calculators/A&P                          Symptoms totally resolved with meds provided here.  I suspect some fluid overload but no obvious abnormalities on CT scan.  Low suspicion for any other emergent causes at this time.  Will DC to follow-up with PCP.  Final Clinical Impression(s) / ED Diagnoses Final diagnoses:  Generalized abdominal pain    Rx / DC Orders ED Discharge Orders     None        Quincie Haroon, Corene Cornea, MD 10/17/21 [phone]

## 2021-10-17 NOTE — Telephone Encounter (Signed)
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 Speaking with patient at the time of call. Patient states this onset last week, was seen, and has had BP readings as low as 86/51; at home her BP has been low for several days. Pt. Was then advised to go to ED now.  Patient complied.

## 2021-10-18 ENCOUNTER — Other Ambulatory Visit: Payer: Self-pay | Admitting: Cardiology

## 2021-10-20 ENCOUNTER — Ambulatory Visit (INDEPENDENT_AMBULATORY_CARE_PROVIDER_SITE_OTHER): Payer: Medicare Other | Admitting: *Deleted

## 2021-10-20 DIAGNOSIS — I5032 Chronic diastolic (congestive) heart failure: Secondary | ICD-10-CM | POA: Diagnosis not present

## 2021-10-20 DIAGNOSIS — E1169 Type 2 diabetes mellitus with other specified complication: Secondary | ICD-10-CM

## 2021-10-20 NOTE — Patient Instructions (Addendum)
[name], it was nice talking with you today.    I look forward to talking to you again for an update on Monday, November 24, 2021 at 1:00 pm- please be listening out for my call that day.  I will call as close to 1:00 pm as possible.   If you need to cancel or re-schedule our telephone visit, please call [phone] and one of our care guides will be happy to assist you.   I look forward to hearing about your progress.   Please don't hesitate to contact me if I can be of assistance to you before our next scheduled telephone appointment.   Maria Rack, RN, BSN, [facility] RN Care Coordination- [place] [phone]: direct office [phone]: mobile   Patient Self-Care Activities:  Patient will self administer medications as prescribed Patient will attend all scheduled provider appointments Patient will call pharmacy for medication refills Patient will call provider office for new concerns or questions Patient will keep up the great work monitoring and writing down on paper daily weights at home: today you reported a weight of 365 lbs Patient will continue to follow prescribed diet: low carb/ low sugar; heart healthy/ low salt; and will follow fluid restrictions of 48- 64 ounces per day Patient will continue to talk to cardiology provider about the concerns with your fluid pill not pulling off fluid effectively Patient will watch daily for increased feeling of shortness of breath, swelling in feet, ankles and legs every day: if you have an increased amount of shortness of breath or swelling, make a note of it and let your cardiology provider know if it persists Patient will continue to keep legs up while sitting and wear compression stockings during daytime hours Patient will stay as active as possible without over-doing: rest if you start to feel tired or "winded"/ short of breath Patient will continue to monitor and write down on  paper daily fasting blood sugars Patient will continue to work with home health team: PT and RN Heart Failure Exacerbation  Heart failure is a condition in which the heart has trouble pumping blood. This may mean that the heart cannot pump enough blood out to the body or that the heart does not fill up with enough blood. When this happens, parts of the body do not get the blood and oxygen they need to function properly. This can cause symptoms such as breathing problems, tiredness (fatigue), swelling, and confusion. Heart failure exacerbation refers to heart failure symptoms that get worse. The symptoms may get worse suddenly or develop slowly over time. Heart failure exacerbation is a serious medical problem that should be treated right away. What are the causes? A heart failure exacerbation can be triggered by: Not taking your heart failure medicines correctly. Infections. Eating an unhealthy diet or a diet that is high in salt (sodium). Drinking too much fluid. Drinking alcohol. Using drugs, such as cocaine or methamphetamine. Not exercising. Other causes include: Other heart conditions such as an irregular heart rhythm (arrhythmia). Worsening heart valve function. Low blood counts (anemia). Other medical problems, such as kidney failure, thyroid problems, or diabetes mellitus. Sometimes the cause of the exacerbation is not known. What are the signs or symptoms? When heart failure symptoms suddenly or slowly get worse, this may be a sign of heart failure exacerbation. Symptoms of heart failure include: Shortness of breath during activity or exercise. A cough that does not go away. Swelling of the legs,  ankles, feet, or abdomen. Losing or gaining weight for no reason. Trouble breathing when lying down. Increased heart rate or irregular heartbeat. Fatigue. Feeling light-headed, dizzy, or close to fainting. Nausea or lack of appetite. How is this diagnosed? This condition is diagnosed  based on: Your symptoms and medical history. A physical exam. You may also have tests, including: Electrocardiogram (ECG). This test measures the electrical activity of your heart. Echocardiogram. This test uses sound waves to take a picture of your heart to see how well it works. Blood tests. Imaging tests, such as: Chest X-ray. MRI. Ultrasound. Stress test. This test examines how well your heart functions while you exercise on a treadmill or exercise bike. If you cannot exercise, medicines may be used to increase your heartbeat in place of exercise. Cardiac catheterization. During this test, a thin, flexible tube (catheter) is inserted into a blood vessel and threaded up to your heart. This test allows your health care provider to check the arteries that lead to your heart (coronary arteries). Right heart catheterization. During this test, the pressure in your heart is measured. How is this treated? This condition may be treated by: Adjusting your heart medicines. Maintaining a healthy lifestyle. This includes: Eating a heart-healthy diet that is low in sodium. Not using products that contain nicotine or tobacco. Regular exercise. Monitoring your fluid intake. Monitoring your weight and reporting changes to your health care provider. Not using alcohol or drugs. Treating sleep apnea, if you have this condition. Surgery. This may include: Placing a pacemaker to improve heart function (cardiac resynchronization therapy). Implanting a device that can correct heart rhythm problems (implantable cardioverter defibrillator). Implanting a pulmonary arterial pressure monitor to monitor your fluid balance. Connecting a device to your heart to help it pump blood (ventricular assist device). Heart transplant. Follow these instructions at home: Medicines Take over-the-counter and prescription medicines only as told by your health care provider. Do not stop taking your medicines or change the  amount you take. If you are having problems or side effects from your medicines, talk to your health care provider. If you are having difficulty paying for your medicines, contact a social worker or your clinic. There are many programs to assist with medicine costs. Talk to your health care provider before starting any new medicines or supplements. Make sure your health care provider and pharmacist have a list of all the medicines you are taking. Eating and drinking  Avoid drinking alcohol. Eat a heart-healthy diet as told by your health care provider. This includes: Plenty of fruits and vegetables. Lean proteins. Low-fat dairy. Whole grains. Foods that are low in sodium. Activity  Exercise regularly as told by your health care provider. Balance exercise with rest. Ask your health care provider what activities are safe for you. This includes sexual activity, exercise, and daily tasks at home or work. Lifestyle Do not use any products that contain nicotine or tobacco. These products include cigarettes, chewing tobacco, and vaping devices, such as e-cigarettes. If you need help quitting, ask your health care provider. Maintain a healthy weight. Ask your health care provider what weight is healthy for you. Consider joining a patient support group. This can help with emotional problems you may have, such as stress and anxiety. Do not use drugs. General instructions Stay up to date with vaccines. Talk to your health care provider about flu and pneumonia vaccines. Keep a list of medicines that you are taking. This may help in emergency situations. Keep all follow-up visits. This is  important. Contact a health care provider if: You have questions about your medicines or you miss a dose. You feel anxious, depressed, or stressed. You develop swelling in your feet, ankles, legs, or abdomen. You develop a cough. You have a fever. You have trouble sleeping. You gain 2-3 lb (1-1.4 kg) in 24 hours  or 5 lb (2.3 kg) in a week. Get help right away if: You have chest pain or pressure. You have shortness of breath while resting. You have severe fatigue. You are confused. You have severe dizziness. You have a rapid or irregular heartbeat. You have nausea or you vomit. You have a cough that is worse at night or you cannot lie flat. You have severe depression or sadness. These symptoms may represent a serious problem that is an emergency. Do not wait to see if the symptoms will go away. Get medical help right away. Call your local emergency services (911 in the [place] Do not drive yourself to the hospital. Summary When heart failure symptoms get worse, it is called heart failure exacerbation. Common causes of this condition include taking medicines incorrectly, infections, and drinking alcohol. This condition may be treated by adjusting medicines, maintaining a healthy lifestyle, or surgery. Do not stop taking your medicines or change the amount you take. If you are having problems or side effects from your medicines, talk to your health care provider. This information is not intended to replace advice given to you by your health care provider. Make sure you discuss any questions you have with your health care provider. Document Revised: 06/29/2020 Document Reviewed: 06/29/2020 Elsevier Patient Education  2022 [place].  Patient verbalizes understanding of instructions provided today and agrees to view in MyChart Telephone follow up appointment with care management team member scheduled for:  Monday, November 24, 2021 at 1:00 pm The patient has been provided with contact information for the care management team and has been advised to call with any health related questions or concerns

## 2021-10-20 NOTE — Chronic Care Management (AMB) (Signed)
Chronic Care Management   CCM RN Visit Note  10/20/2021 Name: Maria [name] MRN: [id number] DOB: September 08, 1956  Subjective: Maria [name] is a 65 y.o. year old female who is a primary care patient of Biagio Borg, MD. The care management team was consulted for assistance with disease management and care coordination needs.    Engaged with patient by telephone for follow up visit in response to provider referral for case management and/or care coordination services.   Consent to Services:  The patient was given information about Chronic Care Management services, agreed to services, and gave verbal consent prior to initiation of services.  Please see initial visit note for detailed documentation.  Patient agreed to services and verbal consent obtained.   Assessment: Review of patient past medical history, allergies, medications, health status, including review of consultants reports, laboratory and other test data, was performed as part of comprehensive evaluation and provision of chronic care management services.   CCM Care Plan Allergies  Allergen Reactions   Amoxicillin-Pot Clavulanate Nausea And Vomiting    Projectile vomiting Did it involve swelling of the face/tongue/throat, SOB, or low BP? No Did it involve sudden or severe rash/hives, skin peeling, or any reaction on the inside of your mouth or nose? No Did you need to seek medical attention at a hospital or doctor's office? No When did it last happen?      20-30 years If all above answers are "NO", may proceed with cephalosporin use.    Adhesive [Tape] Other (See Comments)    Tears skin off - use paper tape    Codeine Other (See Comments)    hallucinations   Crestor [Rosuvastatin Calcium]     Severe muscle cramps   Morphine And Related Nausea And Vomiting and Other (See Comments)    Migraine headaches   Vicodin [Hydrocodone-Acetaminophen] Other (See Comments)    hallucinations   Latex Rash   Outpatient Encounter  Medications as of 10/20/2021  Medication Sig   Accu-Chek Softclix Lancets lancets Use as directed up to 4 times daily (Patient taking differently: 1 each by Other route in the morning, at noon, in the evening, and at bedtime.)   acetaminophen (TYLENOL) 325 MG tablet Take 650 mg by mouth every 6 (six) hours as needed for headache or fever (pain).   albuterol (PROAIR HFA) 108 (90 Base) MCG/ACT inhaler Inhale 2 puffs into the lungs every 6 (six) hours as needed for wheezing or shortness of breath.   Blood Glucose Monitoring Suppl (ACCU-CHEK GUIDE) w/Device KIT Use as directed (Patient taking differently: 1 each by Other route as directed.)   Cholecalciferol (VITAMIN D3) 50 MCG (2000 UT) CAPS Take 2 capsules by mouth daily.   clonazePAM (KLONOPIN) 1 MG tablet TAKE 1 TABLET BY MOUTH 2 TIMES A DAY AS NEEDED FOR ANXIETY (Patient taking differently: Take 1 mg by mouth 2 (two) times daily as needed for anxiety.)   cyclobenzaprine (FLEXERIL) 10 MG tablet TAKE 1 TABLET BY MOUTH 2 TIMES A DAY AS NEEDED FOR MUSCLE SPASMS (Patient taking differently: Take 10 mg by mouth 2 (two) times daily as needed for muscle spasms.)   Fexofenadine HCl (ALLEGRA PO) Take 180 mg by mouth daily as needed (allergies).   glucose blood (ACCU-CHEK GUIDE) test strip Use as instructed up to 4 times daily (Patient taking differently: Use as instructed up to 4 times daily)   losartan (COZAAR) 50 MG tablet Take 1 tablet (50 mg total) by mouth daily.   Metamucil Fiber CHEW  Chew 3 tablets by mouth at bedtime.   metFORMIN (GLUCOPHAGE) 500 MG tablet Take 1 tablet (500 mg total) by mouth 2 (two) times daily with a meal. (Patient not taking: No sig reported)   metoprolol tartrate (LOPRESSOR) 25 MG tablet Take 25 mg by mouth 2 (two) times daily.   NARCAN 4 MG/0.1ML LIQD nasal spray kit Place 0.4 mg into the nose daily as needed (opioid overdose).   ondansetron (ZOFRAN ODT) 4 MG disintegrating tablet Take 1 tablet (4 mg total) by mouth every 4  (four) hours as needed for nausea or vomiting.   Oxycodone HCl 10 MG TABS Take 1 tablet (10 mg total) 4 (four) times daily by mouth. Per [facility] (Patient taking differently: Take 10 mg by mouth every 4 (four) hours as needed (pain). Per [facility])   pantoprazole (PROTONIX) 40 MG tablet TAKE ONE TABLET BY MOUTH DAILY (Patient taking differently: Take 40 mg by mouth daily.)   Polyethyl Glycol-Propyl Glycol 0.4-0.3 % SOLN Apply 1 drop to eye daily as needed (dry eyes).   polyethylene glycol powder (GLYCOLAX/MIRALAX) 17 GM/SCOOP powder Take 17 g by mouth daily as needed for mild constipation. (Patient not taking: No sig reported)   potassium chloride SA (KLOR-CON) 20 MEQ tablet Take 1 tablet (20 mEq total) by mouth 2 (two) times daily.   rOPINIRole (REQUIP) 1 MG tablet Take 1 tablet (1 mg total) by mouth 3 (three) times daily.   Simethicone (GAS-X EXTRA STRENGTH PO) Take 1 tablet by mouth daily as needed (gas).   torsemide (DEMADEX) 20 MG tablet Take 20 mg  4 tablets ( 80 mg ) twice a day.May take a extra 20 mg daily if weight over 370 lbs.   [DISCONTINUED] DULoxetine (CYMBALTA) 60 MG capsule Take 60 mg by mouth daily.     No facility-administered encounter medications on file as of 10/20/2021.   Patient Active Problem List   Diagnosis Date Noted   Hypernatremia 09/24/2021   Cellulitis 09/24/2021   CKD (chronic kidney disease), stage IIIa ([place]) 09/24/2021   Sacral decubitus ulcer, stage II ([place]) 09/20/2021   Acute on chronic diastolic CHF (congestive heart failure) ([place]) 09/19/2021   Nail disorder 07/07/2021   Pre-ulcerative corn or callous 07/07/2021   Rash 54/56/2563   Diastolic congestive heart failure ([place]) 03/17/2021   Diabetes mellitus type 2 in obese ([place]) 03/11/2021   Vitamin D deficiency 01/01/2021   Medial epicondylitis 12/31/2020   Aortic atherosclerosis ([place]) 89/37/3428   Umbilical hernia 76/81/1572   Constipation 08/05/2020   Arthritis 06/12/2020    Hoarseness 05/01/2020   Vitamin B 12 deficiency 01/31/2020   Rosacea 01/31/2020   Neck swelling 01/31/2020   History of colonic polyps    Benign neoplasm of [place]    Nausea 08/31/2019   Throat pain 07/26/2019   HLD (hyperlipidemia) 05/24/2019   Leg cramps 05/24/2019   Lump in neck 05/24/2019   Left thyroid nodule 01/25/2019   Jerking movements of extremities 07/20/2018   Balance disorder 07/20/2018   Right knee pain 03/21/2018   OSA (obstructive sleep apnea) 03/21/2018   Oxygen desaturation 03/21/2018   Urinary symptom or sign 03/21/2018   Acquired lymphedema 01/25/2018   Gait disorder 01/25/2018   Peripheral edema 01/16/2018   SOB (shortness of breath) 01/15/2018   Dysphonia 12/07/2017   Encounter for well adult exam with abnormal findings 11/08/2017   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/31/2017   Alles polyp 08/11/2016   Abdominal pain, epigastric    Dysphagia    [facility]  cancer screening    ACE-inhibitor cough 12/05/2015   Restless legs syndrome 10/08/2015   Venous stasis dermatitis of both lower extremities 04/26/2015   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/17/2014   AR (allergic rhinitis) 10/17/2014   Fatty liver 10/17/2014   Chronic pain syndrome 09/19/2014   Post-traumatic osteoarthritis of both knees 09/19/2014   Spinal stenosis 09/19/2014   Depression, major, single episode, complete remission ([place]) 09/19/2014   Narcotic dependence ([place]) 09/19/2014   Spondylosis of 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 03/09/2012   Lumbar disc disease 03/09/2012   Chronic pain 03/09/2012   Morbid obesity ([place]) 06/17/2011   Chronic low back pain 12/28/2008   Anxiety state 12/06/2007   Depression 12/06/2007   Essential hypertension 12/06/2007   Gastroesophageal reflux disease without esophagitis 12/06/2007   LOW BACK PAIN 12/06/2007   Conditions to be addressed/monitored:  CHF and DMII  Care Plan : RN Care Manager Plan of Care  Updates made by [name] Royalty, RN since  10/20/2021 12:00 AM     Problem: Chronic Disease Management Needs   Priority: Medium     Long-Range Goal: Development of plan of care for long term chronic disease management   Start Date: 08/26/2021  Expected End Date: 08/26/2022  Priority: Medium  Note:   Current Barriers:  Chronic Disease Management support and education needs related to CHF and DMII Fragile state of health, multiple progressing chronic health conditions Recent in-patient hospitalizations x 2 in Spring 2022 for CHF exacerbation/ weight gain at home, shortness of breath New/ recent hospitalization: September 30- September 25, 2021- CHF exacerbation; discharged with home health services in place  RNCM Clinical Goal(s):  CCM RN CM Care plan format converted 08/26/21 Patient will demonstrate ongoing health management independence for CHF and DMII  through collaboration with RN Care manager, provider, and care team.   Interventions: 1:1 collaboration with primary care provider regarding development and update of comprehensive plan of care as evidenced by provider attestation and co-signature Inter-disciplinary care team collaboration (see longitudinal plan of care) Evaluation of current treatment plan related to  self management and patient's adherence to plan as established by provider Chart reviewed including relevant office notes, upcoming scheduled appointments, and lab results  Heart Failure Interventions:  (Status: Goal on Track (progressing): YES.) Basic overview and discussion of pathophysiology of Heart Failure reviewed; Discussed importance of daily weight and advised patient to weigh and record daily; Reviewed role of diuretics in prevention of fluid overload and management of heart failure; Discussed the importance of keeping all appointments with provider; Advised patient to discuss ongoing recommendations for fluid restrictions- currently restricting to < 64 ounces per day with provider; Reviewed with patient  recent inpatient and ED hospital visits: patient reports doing "better" now; denies acute clinical concerns today Reviewed with patient her recent daily weights at home- reports weight consistently between 359-366 lbs; reports weight today at "365 lbs:"  this is roughly patient's established baseline Confirmed no concerns with breathing status; reports "just a little swelling" in ankles- per baseline  Confirmed home health team active in care; patient actively participating in RN/ PT visits: reports "helpful" Confirmed no concerns around medications- patient continues to manage independently; confirms she is taking Torsemide 80 mg po BID Reviewed recent cardiology provider office visit with patient: 10/09/21- patient verbalizes good general understanding of post-visit instructions, denies questions Confirms continues following low salt, heart healthy, carb-modified, low sugar diet Re-iterated and reviewed previously provided education around signs/ symptoms CHF yellow zone, importance of daily assessments  at home, action plan for yellow CHF zone: patient remains able to verbalize all with minimal prompting-- however, given recent hospitalizations and high acuity nature-- she will benefit from ongoing support and reinforcement Confirmed with patient her report that she has obtained the flu vaccine for 2022-23 flu/ winter season: positive reinforcement provided  Diabetes:  (Status: Goal on Track (progressing): YES.) Lab Results  Component Value Date   HGBA1C 6.1 07/07/2021  Assessed patient's understanding of A1c goal: <6.5% Reviewed prescribed diet with patient low carb/ low sugar; heart healthy, low salt, fluid restricted- <48 ounces per day; confirms patient following diet recommendations; Discussed plans with patient for ongoing care management follow up and provided patient with direct contact information for care management team;      Reviewed scheduled/upcoming provider appointments including:  10/29/21- PCP; 10/31/21- CCM Pharmacy team; 11/20/21- cardiology in-person office visit; confirmed patient has plans to attend all as scheduled- continues using transportation benefit of insurance provider ;         Review of patient status, including review of consultants reports, relevant laboratory and other test results, and medications completed;       Reviewed recent blood sugars at home: monitoring at least QD fasting: reports recent ranges at home consistently between 96- 120: encouraged her ongoing monitoring/ recording of daily blood sugars Discussed recent discontinuation of metformin: patient reports was told to stop taking medication during "one of my hospital visits;" due to renal considerations:  reports she is not taking metformin any longer-- encouraged her to share this with PCP  Patient Goals/Self-Care Activities: Patient will self administer medications as prescribed Patient will attend all scheduled provider appointments Patient will call pharmacy for medication refills Patient will call provider office for new concerns or questions Patient will keep up the great work monitoring and writing down on paper daily weights at home: today you reported a weight of 365 lbs Patient will continue to follow prescribed diet: low carb/ low sugar; heart healthy/ low salt; and will follow fluid restrictions of 48- 64 ounces per day Patient will continue to talk to cardiology provider about the concerns with your fluid pill not pulling off fluid effectively Patient will watch daily for increased feeling of shortness of breath, swelling in feet, ankles and legs every day: if you have an increased amount of shortness of breath or swelling, make a note of it and let your cardiology provider know if it persists Patient will continue to keep legs up while sitting and wear compression stockings during daytime hours Patient will stay as active as possible without over-doing: rest if you start to feel  tired or "winded"/ short of breath Patient will continue to monitor and write down on paper daily fasting blood sugars Patient will continue to work with home health team: PT and RN        Plan: Telephone follow up appointment with care management team member scheduled for:  Monday, November 24, 2021 at 1:00 pm The patient has been provided with contact information for the care management team and has been advised to call with any health related questions or concerns  [name], RN, BSN, [place] [phone]: direct office [phone]: mobile

## 2021-10-21 DIAGNOSIS — E1151 Type 2 diabetes mellitus with diabetic peripheral angiopathy without gangrene: Secondary | ICD-10-CM | POA: Diagnosis not present

## 2021-10-21 DIAGNOSIS — I890 Lymphedema, not elsewhere classified: Secondary | ICD-10-CM | POA: Diagnosis not present

## 2021-10-21 DIAGNOSIS — I872 Venous insufficiency (chronic) (peripheral): Secondary | ICD-10-CM | POA: Diagnosis not present

## 2021-10-21 DIAGNOSIS — I110 Hypertensive heart disease with heart failure: Secondary | ICD-10-CM | POA: Diagnosis not present

## 2021-10-21 DIAGNOSIS — I5033 Acute on chronic diastolic (congestive) heart failure: Secondary | ICD-10-CM | POA: Diagnosis not present

## 2021-10-21 NOTE — Telephone Encounter (Signed)
I do not see any recent readjustment of torsemide since her hospital release, I would stick with 80mg  BID of torsemide.

## 2021-10-22 DIAGNOSIS — I872 Venous insufficiency (chronic) (peripheral): Secondary | ICD-10-CM | POA: Diagnosis not present

## 2021-10-22 DIAGNOSIS — I890 Lymphedema, not elsewhere classified: Secondary | ICD-10-CM | POA: Diagnosis not present

## 2021-10-22 DIAGNOSIS — I110 Hypertensive heart disease with heart failure: Secondary | ICD-10-CM | POA: Diagnosis not present

## 2021-10-22 DIAGNOSIS — I5033 Acute on chronic diastolic (congestive) heart failure: Secondary | ICD-10-CM | POA: Diagnosis not present

## 2021-10-22 DIAGNOSIS — E1151 Type 2 diabetes mellitus with diabetic peripheral angiopathy without gangrene: Secondary | ICD-10-CM | POA: Diagnosis not present

## 2021-10-24 DIAGNOSIS — I110 Hypertensive heart disease with heart failure: Secondary | ICD-10-CM | POA: Diagnosis not present

## 2021-10-24 DIAGNOSIS — I890 Lymphedema, not elsewhere classified: Secondary | ICD-10-CM | POA: Diagnosis not present

## 2021-10-24 DIAGNOSIS — I872 Venous insufficiency (chronic) (peripheral): Secondary | ICD-10-CM | POA: Diagnosis not present

## 2021-10-24 DIAGNOSIS — E1151 Type 2 diabetes mellitus with diabetic peripheral angiopathy without gangrene: Secondary | ICD-10-CM | POA: Diagnosis not present

## 2021-10-24 DIAGNOSIS — I5033 Acute on chronic diastolic (congestive) heart failure: Secondary | ICD-10-CM | POA: Diagnosis not present

## 2021-10-28 DIAGNOSIS — I872 Venous insufficiency (chronic) (peripheral): Secondary | ICD-10-CM | POA: Diagnosis not present

## 2021-10-28 DIAGNOSIS — I890 Lymphedema, not elsewhere classified: Secondary | ICD-10-CM | POA: Diagnosis not present

## 2021-10-28 DIAGNOSIS — E1151 Type 2 diabetes mellitus with diabetic peripheral angiopathy without gangrene: Secondary | ICD-10-CM | POA: Diagnosis not present

## 2021-10-28 DIAGNOSIS — I5033 Acute on chronic diastolic (congestive) heart failure: Secondary | ICD-10-CM | POA: Diagnosis not present

## 2021-10-28 DIAGNOSIS — I110 Hypertensive heart disease with heart failure: Secondary | ICD-10-CM | POA: Diagnosis not present

## 2021-10-29 ENCOUNTER — Other Ambulatory Visit: Payer: Self-pay

## 2021-10-29 ENCOUNTER — Ambulatory Visit (INDEPENDENT_AMBULATORY_CARE_PROVIDER_SITE_OTHER): Payer: Medicare Other | Admitting: Internal Medicine

## 2021-10-29 ENCOUNTER — Encounter: Payer: Self-pay | Admitting: Internal Medicine

## 2021-10-29 DIAGNOSIS — G8929 Other chronic pain: Secondary | ICD-10-CM

## 2021-10-29 DIAGNOSIS — M25561 Pain in right knee: Secondary | ICD-10-CM | POA: Diagnosis not present

## 2021-10-29 DIAGNOSIS — M25562 Pain in left knee: Secondary | ICD-10-CM

## 2021-10-29 DIAGNOSIS — E669 Obesity, unspecified: Secondary | ICD-10-CM

## 2021-10-29 DIAGNOSIS — E1169 Type 2 diabetes mellitus with other specified complication: Secondary | ICD-10-CM

## 2021-10-29 DIAGNOSIS — Z23 Encounter for immunization: Secondary | ICD-10-CM | POA: Diagnosis not present

## 2021-10-29 DIAGNOSIS — I5032 Chronic diastolic (congestive) heart failure: Secondary | ICD-10-CM

## 2021-10-29 DIAGNOSIS — N1831 Chronic kidney disease, stage 3a: Secondary | ICD-10-CM | POA: Diagnosis not present

## 2021-10-29 DIAGNOSIS — I890 Lymphedema, not elsewhere classified: Secondary | ICD-10-CM

## 2021-10-29 NOTE — Assessment & Plan Note (Signed)
Stable overall intravascular I suspect, cont current med tx

## 2021-10-29 NOTE — Assessment & Plan Note (Signed)
Lab Results  Component Value Date   HGBA1C 6.1 (H) 09/20/2021   Stable, pt to continue current medical treatment  - metformin

## 2021-10-29 NOTE — Assessment & Plan Note (Signed)
Lab Results  Component Value Date   CREATININE 1.18 (H) 10/16/2021   Stable overall, cont to avoid nephrotoxins, refer renal per pt request

## 2021-10-29 NOTE — Patient Instructions (Signed)
You had the flu shot, and the pneumovax shot today  Please continue all other medications as before  Please have the pharmacy call with any other refills you may need.  Please continue your efforts at being more active, low cholesterol diet  Please keep your appointments with your specialists as you may have planned  You will be contacted regarding the referral for: Nephrology (kidney), Orthopedic ([facility]), and Weight management clinic  Please make an Appointment to return in 3 months, or sooner if needed

## 2021-10-29 NOTE — Assessment & Plan Note (Signed)
Also for ortho referral - ? Need cortisone

## 2021-10-29 NOTE — Progress Notes (Signed)
Patient ID: Maria [name], female   DOB: 24-Aug-1956, 65 y.o.   MRN: [id number]        Chief Complaint: follow up obesity, peripheral edema, right > left knee and leg pain, diast chf, ckd, dm       HPI:  Maria [name] is a 65 y.o. female here overall doing ok but with persistent swelling to the knees and legs right > left despite attempts at wt loss and high dose torsemide/potassium - 80 bid  CBG's fortunately in the low 100s now, but unable to lose fat wt or reduce leg swelling - diuretics just not working well, and BNP has been consistently normal.  Pt missed renal appt - asks for referral again, as well as ortho, and also wt management.  Pt denies chest pain, increased sob or doe, wheezing, orthopnea, PND, palpitations, dizziness or syncope.  Pt denies polydipsia, polyuria, or new focal neuro s/s.  Was seen in ED oct 27 with abd pain now resolved - CT abd/pelvic without acute finding.  Due for flu shot and pneumovax.        Wt Readings from Last 3 Encounters:  10/29/21 (!) 369 lb (167.4 kg)  10/09/21 (!) 361 lb 9.6 oz (164 kg)  09/25/21 (!) 358 lb 0.4 oz (162.4 kg)   BP Readings from Last 3 Encounters:  10/29/21 114/60  10/17/21 (!) 142/47  10/09/21 120/62         Past Medical History:  Diagnosis Date   Acute lymphadenitis 2011   ALLERGIC RHINITIS 08/10/2007   ANXIETY 12/06/2007   Atherosclerotic peripheral vascular disease ([place]) 06/13/2013   Aorta on CT June 2014   Cellulitis 2011   Cervical disc disease 03/09/2012   CHF (congestive heart failure) ([place])    Chronic pain 03/09/2012   DEPRESSION 12/06/2007   Diabetes ([place])    DIVERTICULOSIS, [place] 12/06/2007   GERD 12/06/2007   Hepatitis    age 33 hepatitis A   HLD (hyperlipidemia) 05/24/2019   HYPERTENSION 12/06/2007   Lumbar disc disease 03/09/2012   Mesenteric adenitis    MRSA 2006   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 2010   [place]  may 24th 2010   COLONOSCOPY WITH PROPOFOL N/A 07/10/2016   Procedure: COLONOSCOPY WITH PROPOFOL;  Surgeon: Manus Gunning, MD;  Location: [facility];  Service: Gastroenterology;  Laterality: N/A;   COLONOSCOPY WITH PROPOFOL N/A 09/26/2019   Procedure: COLONOSCOPY WITH PROPOFOL;  Surgeon: Yetta Flock, MD;  Location: [facility];  Service: Gastroenterology;  Laterality: N/A;   ESOPHAGOGASTRODUODENOSCOPY (EGD) WITH PROPOFOL N/A 07/10/2016   Procedure: ESOPHAGOGASTRODUODENOSCOPY (EGD) WITH PROPOFOL;  Surgeon: Manus Gunning, MD;  Location: [facility];  Service: Gastroenterology;  Laterality: N/A;   POLYPECTOMY  09/26/2019   Procedure: POLYPECTOMY;  Surgeon: Yetta Flock, MD;  Location: [facility];  Service: Gastroenterology;;   s/p ovary cyst     s/p right knee arthroscopy     Dr. Mardelle Matte ortho    reports that she quit smoking about 12 years ago. Her smoking use included cigarettes. She has never used smokeless tobacco. She reports that she does not drink alcohol and does not use drugs. family history includes Anxiety disorder in her sister; Asthma in her daughter and sister; Bipolar disorder in her  daughter; Cancer in her maternal uncle and another family member; Depression in her sister; Diabetes in her mother; Heart disease in her mother; Heart failure in her mother; Hypertension in her father and mother; Liver cancer in her paternal grandmother. Allergies  Allergen Reactions   Amoxicillin-Pot Clavulanate Nausea And Vomiting    Projectile vomiting Did it involve swelling of the face/tongue/throat, SOB, or low BP? No Did it involve sudden or severe rash/hives, skin peeling, or any reaction on the inside of your mouth or nose? No Did you need to seek medical attention at a hospital or doctor's office? No When did it  last happen?      20-30 years If all above answers are "NO", may proceed with cephalosporin use.    Adhesive [Tape] Other (See Comments)    Tears skin off - use paper tape    Codeine Other (See Comments)    hallucinations   Crestor [Rosuvastatin Calcium]     Severe muscle cramps   Morphine And Related Nausea And Vomiting and Other (See Comments)    Migraine headaches   Vicodin [Hydrocodone-Acetaminophen] Other (See Comments)    hallucinations   Latex Rash   Current Outpatient Medications on File Prior to Visit  Medication Sig Dispense Refill   Accu-Chek Softclix Lancets lancets Use as directed up to 4 times daily (Patient taking differently: 1 each by Other route in the morning, at noon, in the evening, and at bedtime.) 100 each 5   acetaminophen (TYLENOL) 325 MG tablet Take 650 mg by mouth every 6 (six) hours as needed for headache or fever (pain).     albuterol (PROAIR HFA) 108 (90 Base) MCG/ACT inhaler Inhale 2 puffs into the lungs every 6 (six) hours as needed for wheezing or shortness of breath. 1 each 0   Blood Glucose Monitoring Suppl (ACCU-CHEK GUIDE) w/Device KIT Use as directed (Patient taking differently: 1 each by Other route as directed.) 1 kit 0   Cholecalciferol (VITAMIN D3) 50 MCG (2000 UT) CAPS Take 2 capsules by mouth daily.     clonazePAM (KLONOPIN) 1 MG tablet TAKE 1 TABLET BY MOUTH 2 TIMES A DAY AS NEEDED FOR ANXIETY (Patient taking differently: Take 1 mg by mouth 2 (two) times daily as needed for anxiety.) 60 tablet 5   cyclobenzaprine (FLEXERIL) 10 MG tablet TAKE 1 TABLET BY MOUTH 2 TIMES A DAY AS NEEDED FOR MUSCLE SPASMS (Patient taking differently: Take 10 mg by mouth 2 (two) times daily as needed for muscle spasms.) 180 tablet 1   Fexofenadine HCl (ALLEGRA PO) Take 180 mg by mouth daily as needed (allergies).     glucose blood (ACCU-CHEK GUIDE) test strip Use as instructed up to 4 times daily (Patient taking differently: Use as instructed up to 4 times daily) 100  each 12   losartan (COZAAR) 50 MG tablet Take 1 tablet (50 mg total) by mouth daily. 90 tablet 3   Metamucil Fiber CHEW Chew 3 tablets by mouth at bedtime.     metoprolol tartrate (LOPRESSOR) 25 MG tablet Take 25 mg by mouth 2 (two) times daily.     NARCAN 4 MG/0.1ML LIQD nasal spray kit Place 0.4 mg into the nose daily as needed (opioid overdose).     ondansetron (ZOFRAN ODT) 4 MG disintegrating tablet Take 1 tablet (4 mg total) by mouth every 4 (four) hours as needed for nausea or vomiting. 20 tablet 0   Oxycodone HCl 10 MG TABS Take 1 tablet (10 mg total) 4 (four) times daily  by mouth. Per [facility] (Patient taking differently: Take 10 mg by mouth every 4 (four) hours as needed (pain). Per [facility]) 30 tablet 0   pantoprazole (PROTONIX) 40 MG tablet TAKE ONE TABLET BY MOUTH DAILY (Patient taking differently: Take 40 mg by mouth daily.) 90 tablet 2   Polyethyl Glycol-Propyl Glycol 0.4-0.3 % SOLN Apply 1 drop to eye daily as needed (dry eyes).     rOPINIRole (REQUIP) 1 MG tablet Take 1 tablet (1 mg total) by mouth 3 (three) times daily. 90 tablet 5   Simethicone (GAS-X EXTRA STRENGTH PO) Take 1 tablet by mouth daily as needed (gas).     torsemide (DEMADEX) 20 MG tablet Take 20 mg  4 tablets ( 80 mg ) twice a day.May take a extra 20 mg daily if weight over 370 lbs. 180 tablet 3   metFORMIN (GLUCOPHAGE) 500 MG tablet Take 1 tablet (500 mg total) by mouth 2 (two) times daily with a meal. (Patient not taking: No sig reported) 60 tablet 1   polyethylene glycol powder (GLYCOLAX/MIRALAX) 17 GM/SCOOP powder Take 17 g by mouth daily as needed for mild constipation. (Patient not taking: No sig reported) 510 g 0   potassium chloride SA (KLOR-CON) 20 MEQ tablet Take 1 tablet (20 mEq total) by mouth 2 (two) times daily. 180 tablet 3   [DISCONTINUED] DULoxetine (CYMBALTA) 60 MG capsule Take 60 mg by mouth daily.       No current facility-administered medications on file prior to visit.         ROS:  All others reviewed and negative.  Objective        PE:  BP 114/60 (BP Location: Right Arm, Patient Position: Sitting, Cuff Size: Large)   Pulse 67   Temp 98.7 F (37.1 C) (Oral)   Ht '5\' 5"'  (1.651 m)   Wt (!) 369 lb (167.4 kg)   SpO2 98%   BMI 61.40 kg/m                 Constitutional: Pt appears in NAD               HENT: Head: NCAT.                Right Ear: External ear normal.                 Left Ear: External ear normal.                Eyes: . Pupils are equal, round, and reactive to light. Conjunctivae and EOM are normal               Nose: without d/c or deformity               Neck: Neck supple. Gross normal ROM               Cardiovascular: Normal rate and regular rhythm.                 Pulmonary/Chest: Effort normal and breath sounds without rales or wheezing.                Abd:  Soft, NT, ND, + BS, no organomegaly               Neurological: Pt is alert. At baseline orientation, motor grossly intact               Skin: Skin is warm. No rashes, no other new lesions,  LE edema - 2+ right > left edema               Psychiatric: Pt behavior is normal without agitation   Micro: none  Cardiac tracings I have personally interpreted today:  none  Pertinent Radiological findings (summarize): none   Lab Results  Component Value Date   WBC 11.5 (H) 10/16/2021   HGB 12.3 10/16/2021   HCT 38.5 10/16/2021   PLT 288 10/16/2021   GLUCOSE 95 10/16/2021   CHOL 147 07/07/2021   TRIG 140.0 07/07/2021   HDL 43.30 07/07/2021   LDLDIRECT 127.0 01/25/2019   LDLCALC 76 07/07/2021   ALT 11 10/16/2021   AST 10 (L) 10/16/2021   NA 139 10/16/2021   K 3.6 10/16/2021   CL 97 (L) 10/16/2021   CREATININE 1.18 (H) 10/16/2021   BUN 18 10/16/2021   CO2 36 (H) 10/16/2021   TSH 1.45 12/31/2020   INR 1.0 04/01/2021   HGBA1C 6.1 (H) 09/20/2021   Assessment/Plan:  Maria [name] is a 65 y.o. White or Caucasian [1] female with  has a past medical history of Acute  lymphadenitis (2011), ALLERGIC RHINITIS (08/10/2007), ANXIETY (12/06/2007), Atherosclerotic peripheral vascular disease ([place]) (06/13/2013), Cellulitis (2011), Cervical disc disease (03/09/2012), CHF (congestive heart failure) ([place]), Chronic pain (03/09/2012), DEPRESSION (12/06/2007), Diabetes ([place]), DIVERTICULOSIS, [place] (12/06/2007), GERD (12/06/2007), Hepatitis, HLD (hyperlipidemia) (05/24/2019), HYPERTENSION (12/06/2007), Lumbar disc disease (03/09/2012), Mesenteric adenitis, MRSA (2006), 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[place]) (11/08/2017), and THORACIC/LUMBOSACRAL NEURITIS/RADICULITIS UNSPEC (12/28/2008).  Acquired lymphedema Recent bnp normal, and residual edema likely lymphedema related, cont current med tx - torsemide  Diabetes mellitus type 2 in obese [facility]) Lab Results  Component Value Date   HGBA1C 6.1 (H) 09/20/2021   Stable, pt to continue current medical treatment  - metformin   Morbid obesity ([place]) [place] referral also to wt management - ? monjouro candidate  Bilateral knee pain Also for ortho referral - ? Need cortisone  CKD (chronic kidney disease), stage IIIa ([facility]) Lab Results  Component Value Date   CREATININE 1.18 (H) 10/16/2021   Stable overall, cont to avoid nephrotoxins, refer renal per pt request  Diastolic congestive heart failure ([facility]) Stable overall intravascular I suspect, cont current med tx  Followup: Return in about 3 months (around 01/29/2022).  Cathlean Cower, MD 10/29/2021 1:51 PM [facility]

## 2021-10-29 NOTE — Assessment & Plan Note (Signed)
Recent bnp normal, and residual edema likely lymphedema related, cont current med tx - torsemide

## 2021-10-29 NOTE — Assessment & Plan Note (Signed)
Ok referral also to wt management - ? monjouro candidate

## 2021-10-30 DIAGNOSIS — E1151 Type 2 diabetes mellitus with diabetic peripheral angiopathy without gangrene: Secondary | ICD-10-CM | POA: Diagnosis not present

## 2021-10-30 DIAGNOSIS — I872 Venous insufficiency (chronic) (peripheral): Secondary | ICD-10-CM | POA: Diagnosis not present

## 2021-10-30 DIAGNOSIS — I5033 Acute on chronic diastolic (congestive) heart failure: Secondary | ICD-10-CM | POA: Diagnosis not present

## 2021-10-30 DIAGNOSIS — I110 Hypertensive heart disease with heart failure: Secondary | ICD-10-CM | POA: Diagnosis not present

## 2021-10-30 DIAGNOSIS — I890 Lymphedema, not elsewhere classified: Secondary | ICD-10-CM | POA: Diagnosis not present

## 2021-10-31 ENCOUNTER — Telehealth: Payer: Medicare Other

## 2021-10-31 DIAGNOSIS — I110 Hypertensive heart disease with heart failure: Secondary | ICD-10-CM | POA: Diagnosis not present

## 2021-10-31 DIAGNOSIS — E1151 Type 2 diabetes mellitus with diabetic peripheral angiopathy without gangrene: Secondary | ICD-10-CM | POA: Diagnosis not present

## 2021-10-31 DIAGNOSIS — I890 Lymphedema, not elsewhere classified: Secondary | ICD-10-CM | POA: Diagnosis not present

## 2021-10-31 DIAGNOSIS — I872 Venous insufficiency (chronic) (peripheral): Secondary | ICD-10-CM | POA: Diagnosis not present

## 2021-10-31 DIAGNOSIS — I5033 Acute on chronic diastolic (congestive) heart failure: Secondary | ICD-10-CM | POA: Diagnosis not present

## 2021-10-31 NOTE — Progress Notes (Unsigned)
Chronic Care Management Pharmacy Note  10/31/2021 Name:  Maria [name] MRN:  [id number] DOB:  [birth year]  Summary: - Patient reports that she recently has stopped losartan due to hypotension (blood pressures averaging 110/40's) - since stopping reports that blood pressure has been averaging 120/60's with a HR in the 60's - Has stopped ozempic due to developing severe headache / issues with nausea and diarrhea, has since subsided - continues metformin for BG control which has been averaging 90-110 - denies issues with low blood sugars -Continues to follow with Dr. Lennox Grumbles for pain management - has been using oxycodone, flexeril, and tylenol with success, notes to worsening left ankle pain, but is gradually improving -Reports to about once daily use of clonazepam for anxiety, previously had been following with a counselor but has not met with that counselor in a number of months, is interested in establishing with a new counselor and to work to discontinue clonazepam (due to improved safety with use of oxycodone/ cyclobenzaprine for pain) -Has increased vitamin D to 4000 units daily since last PCP visit, vitamin D level has not been rechecked since increase   Recommendations/Changes made from today's visit: - Continue to monitor BP and BG daily and reach out should they be elevated from goal - Reduce clonazepam to 1/2 tablet daily if needed, start following with new counselor to help better control anxiety  -Recheck Vitamin D levels with next lab appointment , once vitamin D level is in normal range, would recommend starting pravastatin 40m daily to lower LDL due to history of DM2, HTN, and HF  Subjective: BAshunti [name] is an 65y.o. year old female who is a primary patient of JJenny Reichmann JHunt Oris MD.  The CCM team was consulted for assistance with disease management and care coordination needs.    Engaged with patient by telephone for initial visit in response to provider referral for pharmacy case  management and/or care coordination services.   Consent to Services:  The patient was given the following information about Chronic Care Management services today, agreed to services, and gave verbal consent: 1. CCM service includes personalized support from designated clinical staff supervised by the primary care provider, including individualized plan of care and coordination with other care providers 2. 24/7 contact phone numbers for assistance for urgent and routine care needs. 3. Service will only be billed when office clinical staff spend 20 minutes or more in a month to coordinate care. 4. Only one practitioner may furnish and bill the service in a calendar month. 5.The patient may stop CCM services at any time (effective at the end of the month) by phone call to the office staff. 6. The patient will be responsible for cost sharing (co-pay) of up to 20% of the service fee (after annual deductible is met). Patient agreed to services and consent obtained.  Patient Care Team: JBiagio Borg MD as PCP - General (Internal Medicine) HMinus Breeding MD as PCP - Cardiology (Cardiology) MHelayne Seminole MD as Consulting Physician (Otolaryngology) SArtis Delay MD as Referring Physician (Internal Medicine) TKnox Royalty RN as Case Manager  Recent office visits:  07/07/21 JCathlean CowerMD(PCP)- Patient was seen for Type 2 Diabetes. Labs were ordered and referral to Podiatry was placed. Patient was started on Semaglutide 0.5 mg subcutaneously once a week. Patients dosage of Ropinirole has increased to 1 mg TID. Follow up in 6 months   05/08/21 JCathlean CowerMD- Patient was seen for Congestive Heart Failure. Patient declined referral  to Ortho, prefers to wait to see if symptoms worsen.Patient prefers to wait for cardiology virtual visit for any further advice on changes in treatment. Follow up in 4 months    04/18/21 Cathlean Cower MD- Patient was seen for CHF ER FOLLOW UP. Labs were ordered and advised to  follow up in 3 months.   03/17/21 Cathlean Cower MD- Patient was seen for Chronis CHF. Labs were ordered and follow up in 2 weeks.   03/06/21 Cathlean Cower MD- Patient was seen for Leg pain. Patient advised to continue current treatment. No follow up noted.   Recent consult visits:  07/17/21 Tamala Fothergill Regal DPM (Podiatry)- Patient was seen for pain due to Onychomycosis of toenails on both feet. Patient had nails debrided 1-5 both feet with no iatrogenic bleeding. Follow up in 4 months.   05/09/21 Minus Breeding MD(Cardiology, Video)- Patient was seen for ER FOLLOW UP Chronic Congestive heart failure. Follow up in 3 months.   03/28/21 Karsten Ro)- Patient was seen for Chronic pain(Unilateral primary osteoarthritis)   03/23/21 Shanon Ace- Patient was seen for Chronic Pain)   Hospital visits:  Medication Reconciliation was completed by comparing discharge summary, patient's EMR and Pharmacy list, and upon discussion with patient.   1. Admitted to the hospital on 05/01/21 due to Chronic Congestive Heart Failure. Discharge date was 05/01/21. Discharged from [place]?Medications Started at [facility] Discharge:?? -started None   Medication Changes at Hospital Discharge: -Changed None   Medications Discontinued at Hospital Discharge: -Stopped None Medications that remain the same after Hospital Discharge:??  -All other medications will remain the same.     2. Admitted to the hospital on 04/05/21 due to Acute Kidney Injury Discharge date was 04/11/21. Discharged from [place]?Medications Started at [facility] Discharge:?? -started torsemide, magnesium oxide, metoprolol tartrate , polyethylene glycol powder, and potassium chloride    Medication Changes at Hospital Discharge: -Changed None   Medications Discontinued at Hospital Discharge: -Stopped cephALEXin 500 MG  furosemide 20 MG  metolazone 2.5 MG    potassium chloride 10 MEQ CR  Medications that remain the same after Hospital Discharge:??  -All other medications will remain the same.     3. Admitted to the hospital on 04/01/21 due to Chronic Venous Stasis Discharge date was 04/01/21. Discharged from [place]?Medications Started at [facility] Discharge:?? -started None   Medication Changes at Hospital Discharge: -Changed None   Medications Discontinued at Hospital Discharge: -Stopped None Medications that remain the same after Hospital Discharge:??  -All other medications will remain the same.     4. Admitted to the hospital on 03/06/21 due to Acute on Chronic Congestive Heart Failure. Discharge date was 03/11/21. Discharged from [place]?Medications Started at [facility] Discharge:?? -started metformin   Medication Changes at Hospital Discharge: -Changed Furosemide (increased to $RemoveBefor'60mg'ZNGtDmQcQcMQ$  BID) and Ropinirole   Medications Discontinued at Hospital Discharge: -Stopped None Medications that remain the same after Hospital Discharge:??  -All other medications will remain the same.    Objective:  Lab Results  Component Value Date   CREATININE 1.18 (H) 10/16/2021   BUN 18 10/16/2021   GFR 53.82 (L) 07/07/2021   GFRNONAA 52 (L) 10/16/2021   GFRAA >60 08/06/2020   NA 139 10/16/2021   K 3.6 10/16/2021   CALCIUM 9.0 10/16/2021   CO2 36 (H) 10/16/2021   GLUCOSE 95 10/16/2021  Lab Results  Component Value Date/Time   HGBA1C 6.1 (H) 09/20/2021 05:18 AM   HGBA1C 6.1 07/07/2021 03:10 PM   GFR 53.82 (L) 07/07/2021 03:10 PM   GFR 50.54 (L) 04/18/2021 12:18 PM    Last diabetic Eye exam:  Lab Results  Component Value Date/Time   HMDIABEYEEXA No Retinopathy 05/14/2021 11:13 AM    Last diabetic Foot exam:  No results found for: HMDIABFOOTEX   Lab Results  Component Value Date   CHOL 147 07/07/2021   HDL 43.30 07/07/2021   LDLCALC 76 07/07/2021   LDLDIRECT 127.0  01/25/2019   TRIG 140.0 07/07/2021   CHOLHDL 3 07/07/2021    Hepatic Function Latest Ref Rng & Units 10/16/2021 09/20/2021 09/19/2021  Total Protein 6.5 - 8.1 g/dL 7.1 7.0 7.3  Albumin 3.5 - 5.0 g/dL 4.0 3.7 3.8  AST 15 - 41 U/L 10(L) 17 19  ALT 0 - 44 U/L _0 Alk Phosphatase 38 - 126 U/L 75 62 69  Total Bilirubin 0.3 - 1.2 mg/dL 0.6 1.2 1.1  Bilirubin, Direct 0.0 - 0.3 mg/dL - - -    Lab Results  Component Value Date/Time   TSH 1.45 12/31/2020 02:04 PM   TSH 1.08 01/31/2020 04:39 PM    CBC Latest Ref Rng & Units 10/16/2021 09/20/2021 09/19/2021  WBC 4.0 - 10.5 K/uL 11.5(H) 6.7 8.0  Hemoglobin 12.0 - 15.0 g/dL 12.3 12.0 11.6(L)  Hematocrit 36.0 - 46.0 % 38.5 37.0 36.1  Platelets 150 - 400 K/uL 288 228 250    Lab Results  Component Value Date/Time   VD25OH 26.86 (L) 07/07/2021 03:10 PM   VD25OH 24.56 (L) 12/31/2020 02:04 PM    Clinical ASCVD: No  The 10-year ASCVD risk score (Arnett DK, et al., 2019) is: 9.5%   Values used to calculate the score:     Age: 5 years     Sex: Female     Is Non-Hispanic African American: No     Diabetic: Yes     Tobacco smoker: No     Systolic Blood Pressure: 950 mmHg     Is BP treated: Yes     HDL Cholesterol: 43.3 mg/dL     Total Cholesterol: 147 mg/dL    Depression screen [facility] 2/9 04/25/2021 12/31/2020 09/20/2020  Decreased Interest 0 0 0  Down, Depressed, Hopeless 0 0 0  PHQ - 2 Score 0 0 0  Altered sleeping - - 0  Tired, decreased energy - - 0  Change in appetite - - 0  Feeling bad or failure about yourself  - - 0  Trouble concentrating - - 0  Moving slowly or fidgety/restless - - 0  Suicidal thoughts - - 0  PHQ-9 Score - - 0  Difficult doing work/chores - - -  Some recent data might be hidden     Social History   Tobacco Use  Smoking Status Former   Years: 30.00   Types: Cigarettes   Quit date: 11/08/2008   Years since quitting: 12.9  Smokeless Tobacco Never  Tobacco Comments   quit 10/09   BP Readings from  Last 3 Encounters:  10/29/21 114/60  10/17/21 (!) 142/47  10/09/21 120/62   Pulse Readings from Last 3 Encounters:  10/29/21 67  10/17/21 79  10/09/21 62   Wt Readings from Last 3 Encounters:  10/29/21 (!) 369 lb (167.4 kg)  10/09/21 (!) 361 lb 9.6 oz (164 kg)  09/25/21 (!) 358 lb 0.4 oz (162.4 kg)   BMI  Readings from Last 3 Encounters:  10/29/21 61.40 kg/m  10/09/21 60.17 kg/m  09/25/21 59.58 kg/m    Assessment/Interventions: Review of patient past medical history, allergies, medications, health status, including review of consultants reports, laboratory and other test data, was performed as part of comprehensive evaluation and provision of chronic care management services.   SDOH:  (Social Determinants of Health) assessments and interventions performed: Yes  SDOH Screenings   Alcohol Screen: Not on file  Depression (PHQ2-9): Low Risk    PHQ-2 Score: 0  Financial Resource Strain: Not on file  Food Insecurity: No Food Insecurity   Worried About Charity fundraiser in the Last Year: Never true   Ran Out of Food in the Last Year: Never true  Housing: Not on file  Physical Activity: Not on file  Social Connections: Not on file  Stress: Not on file  Tobacco Use: Medium Risk   Smoking Tobacco Use: Former   Smokeless Tobacco Use: Never   Passive Exposure: Not on file  Transportation Needs: No Transportation Needs   Lack of Transportation (Medical): No   Lack of Transportation (Non-Medical): No    CCM Care Plan  Allergies  Allergen Reactions   Amoxicillin-Pot Clavulanate Nausea And Vomiting    Projectile vomiting Did it involve swelling of the face/tongue/throat, SOB, or low BP? No Did it involve sudden or severe rash/hives, skin peeling, or any reaction on the inside of your mouth or nose? No Did you need to seek medical attention at a hospital or doctor's office? No When did it last happen?      20-30 years If all above answers are "NO", may proceed with  cephalosporin use.    Adhesive [Tape] Other (See Comments)    Tears skin off - use paper tape    Codeine Other (See Comments)    hallucinations   Crestor [Rosuvastatin Calcium]     Severe muscle cramps   Morphine And Related Nausea And Vomiting and Other (See Comments)    Migraine headaches   Vicodin [Hydrocodone-Acetaminophen] Other (See Comments)    hallucinations   Latex Rash    Medications Reviewed Today     Reviewed by Biagio Borg, MD (Physician) on 10/29/21 at [facility] List Status: <None>   Medication Order Taking? Sig Documenting Provider Last Dose Status Informant  Accu-Chek Softclix Lancets lancets [id number] Yes Use as directed up to 4 times daily  Patient taking differently: 1 each by Other route in the morning, at noon, in the evening, and at bedtime.   Elodia Florence., MD Taking Active   acetaminophen (TYLENOL) 325 MG tablet [id number] Yes Take 650 mg by mouth every 6 (six) hours as needed for headache or fever (pain). [provider] Taking Active Self  albuterol (PROAIR HFA) 108 (90 Base) MCG/ACT inhaler [id number] Yes Inhale 2 puffs into the lungs every 6 (six) hours as needed for wheezing or shortness of breath. Lucretia Kern, DO Taking Active Self           Med Note [place] May 01, 2021  2:27 PM)    Blood Glucose Monitoring Suppl (ACCU-CHEK GUIDE) w/Device KIT [id number] Yes Use as directed  Patient taking differently: 1 each by Other route as directed.   Elodia Florence., MD Taking Active   Cholecalciferol (VITAMIN D3) 50 MCG (2000 UT) CAPS [id number] Yes Take 2 capsules by mouth daily. [provider] Taking Active Self  clonazePAM (KLONOPIN) 1 MG tablet  [id number] Yes TAKE 1 TABLET BY MOUTH 2 TIMES A DAY AS NEEDED FOR ANXIETY  Patient taking differently: Take 1 mg by mouth 2 (two) times daily as needed for anxiety.   Biagio Borg, MD Taking Active            Med Note [place] May 01, 2021   2:28 PM)    cyclobenzaprine (FLEXERIL) 10 MG tablet [id number] Yes TAKE 1 TABLET BY MOUTH 2 TIMES A DAY AS NEEDED FOR MUSCLE SPASMS  Patient taking differently: Take 10 mg by mouth 2 (two) times daily as needed for muscle spasms.   Biagio Borg, MD Taking Active            Med Note [place] May 01, 2021  2:28 PM)      Discontinued 03/09/12 1633   Fexofenadine HCl (ALLEGRA PO) [id number] Yes Take 180 mg by mouth daily as needed (allergies). [provider] Taking Active Self  glucose blood (ACCU-CHEK GUIDE) test strip [id number] Yes Use as instructed up to 4 times daily  Patient taking differently: Use as instructed up to 4 times daily   Elodia Florence., MD Taking Active   losartan (COZAAR) 50 MG tablet [id number] Yes Take 1 tablet (50 mg total) by mouth daily. Almyra Deforest, [place] Taking Active Self  Metamucil Fiber CHEW [id number] Yes Chew 3 tablets by mouth at bedtime. [provider] Taking Active Self  metFORMIN (GLUCOPHAGE) 500 MG tablet [id number] No Take 1 tablet (500 mg total) by mouth 2 (two) times daily with a meal.  Patient not taking: No sig reported   Biagio Borg, MD Not Taking Active   metoprolol tartrate (LOPRESSOR) 25 MG tablet [id number] Yes Take 25 mg by mouth 2 (two) times daily. [provider] Taking Active Self  NARCAN 4 MG/0.1ML LIQD nasal spray kit [id number] Yes Place 0.4 mg into the nose daily as needed (opioid overdose). [provider] Taking Active Self           Med Note [place] May 01, 2021  2:31 PM)    ondansetron (ZOFRAN ODT) 4 MG disintegrating tablet [id number] Yes Take 1 tablet (4 mg total) by mouth every 4 (four) hours as needed for nausea or vomiting. Charlesetta Shanks, MD Taking Active            Med Note [place] May 01, 2021  2:31 PM)    Oxycodone HCl 10 MG TABS [id number] Yes Take 1 tablet (10 mg total) 4 (four) times daily by mouth. Per [facility]   Patient taking differently: Take 10 mg by mouth every 4 (four) hours as needed (pain). Per [facility]   Biagio Borg, MD Taking Active Self           Med Note Pollie Meyer NICKS, [facility]   Wed Aug 23, 2019  4:15 PM)    pantoprazole (PROTONIX) 40 MG tablet [id number] Yes TAKE ONE TABLET BY MOUTH DAILY  Patient taking differently: Take 40 mg by mouth daily.   Biagio Borg, MD Taking Active   Polyethyl Glycol-Propyl Glycol 0.4-0.3 % Bailey Mech [id number] Yes Apply 1 drop to eye daily as needed (dry eyes). [provider] Taking Active Self  polyethylene glycol powder (GLYCOLAX/MIRALAX) 17 GM/SCOOP powder [id number] No Take 17 g by mouth daily as needed for mild constipation.  Patient not taking: No sig reported   Florene Glen,  A Clint Lipps., MD Not Taking Active            Med Note [place] May 01, 2021  2:32 PM)    potassium chloride SA (KLOR-CON) 20 MEQ tablet [id number]  Take 1 tablet (20 mEq total) by mouth 2 (two) times daily. Minus Breeding, MD  Expired 09/09/21 2359   rOPINIRole (REQUIP) 1 MG tablet [id number] Yes Take 1 tablet (1 mg total) by mouth 3 (three) times daily. Biagio Borg, MD Taking Active Self  Simethicone (GAS-X EXTRA STRENGTH PO) [id number] Yes Take 1 tablet by mouth daily as needed (gas). [provider] Taking Active Self           Med Note [place] May 01, 2021  2:29 PM)    torsemide (DEMADEX) 20 MG tablet [id number] Yes Take 20 mg  4 tablets ( 80 mg ) twice a day.May take a extra 20 mg daily if weight over 370 lbs. Almyra Deforest, [place] Taking Active Self            Patient Active Problem List   Diagnosis Date Noted   Bilateral knee pain 10/29/2021   Hypernatremia 09/24/2021   CKD (chronic kidney disease), stage IIIa ([place]) 09/24/2021   Sacral decubitus ulcer, stage II ([place]) 09/20/2021   Acute on chronic diastolic CHF (congestive heart failure) ([place]) 09/19/2021   Nail disorder 07/07/2021   Pre-ulcerative corn or  callous 07/07/2021   Rash 62/95/2841   Diastolic congestive heart failure ([place]) 03/17/2021   Diabetes mellitus type 2 in obese ([place]) 03/11/2021   Vitamin D deficiency 01/01/2021   Medial epicondylitis 12/31/2020   Aortic atherosclerosis ([place]) 32/44/0102   Umbilical hernia 72/53/6644   Constipation 08/05/2020   Arthritis 06/12/2020   Hoarseness 05/01/2020   Vitamin B 12 deficiency 01/31/2020   Rosacea 01/31/2020   Neck swelling 01/31/2020   History of colonic polyps    Benign neoplasm of [place]    Nausea 08/31/2019   Throat pain 07/26/2019   HLD (hyperlipidemia) 05/24/2019   Leg cramps 05/24/2019   Lump in neck 05/24/2019   Left thyroid nodule 01/25/2019   Jerking movements of extremities 07/20/2018   Balance disorder 07/20/2018   Right knee pain 03/21/2018   OSA (obstructive sleep apnea) 03/21/2018   Oxygen desaturation 03/21/2018   Urinary symptom or sign 03/21/2018   Acquired lymphedema 01/25/2018   Gait disorder 01/25/2018   Peripheral edema 01/16/2018   SOB (shortness of breath) 01/15/2018   Dysphonia 12/07/2017   Encounter for well adult exam with abnormal findings 11/08/2017   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   [place] polyp 08/11/2016   Abdominal pain, epigastric    Dysphagia    [place] cancer screening    ACE-inhibitor cough 12/05/2015   Restless legs syndrome 10/08/2015   Venous stasis dermatitis of both lower extremities 04/26/2015   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/17/2014   AR (allergic rhinitis) 10/17/2014   Fatty liver 10/17/2014   Chronic pain syndrome 09/19/2014   Post-traumatic osteoarthritis of both knees 09/19/2014   Spinal stenosis 09/19/2014   Depression, major, single episode, complete remission ([place]) 09/19/2014   Narcotic dependence ([place]) 09/19/2014   Spondylosis of 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 03/09/2012   Lumbar disc disease 03/09/2012   Chronic pain 03/09/2012   Morbid obesity ([place]) 06/17/2011   Chronic low  back pain 12/28/2008   Anxiety state 12/06/2007   Depression 12/06/2007   Essential hypertension 12/06/2007   Gastroesophageal reflux disease without  esophagitis 12/06/2007   LOW BACK PAIN 12/06/2007    Immunization History  Administered Date(s) Administered   Hepatitis B, ped/adol 06/03/2016, 07/17/2016   Influenza Split 10/13/2012   Influenza, Seasonal, Injecte, Preservative Fre 09/19/2014, 10/08/2015, 09/11/2016   Influenza,inj,Quad PF,6+ Mos 08/31/2013, 11/08/2017, 12/30/2018, 12/31/2020, 10/29/2021   Moderna SARS-COV2 Booster Vaccination 10/19/2020   Moderna Sars-Covid-2 Vaccination 09/21/2020   Pneumococcal Polysaccharide-23 08/18/2013, 10/29/2021   Td 12/21/2004   Tdap 05/01/2015, 11/08/2017    Conditions to be addressed/monitored:  Hyperlipidemia, Diabetes, Heart Failure, GERD, Anxiety, Allergic Rhinitis, Chronic Pain, Constipation, Allergic Rhinitis, and Restless Leg Syndrome   There are no care plans that you recently modified to display for this patient.     Medication Assistance: None required.  Patient affirms current coverage meets needs.   Patient's preferred pharmacy is:  [facility] [id number] [address] [place], [place] - [place] [address] [place] [address] Phone: [phone] Fax: [phone]  [phone] [place], [place] [address] [place] [address] Phone: [phone] Fax: [place], [place] [address] [place] [address] Phone: [phone] Fax: [phone]   Uses pill box? Yes Pt endorses 95-100% compliance  Care Plan and Follow Up Patient Decision:  Patient agrees to Care Plan and Follow-up.  Plan: Telephone follow up appointment with care management team member scheduled for:  3 months  and The patient has been provided with contact information for the care management  team and has been advised to call with any health related questions or concerns.   Tomasa Blase, PharmD Clinical Pharmacist, [place]

## 2021-11-03 DIAGNOSIS — I5033 Acute on chronic diastolic (congestive) heart failure: Secondary | ICD-10-CM | POA: Diagnosis not present

## 2021-11-03 DIAGNOSIS — I872 Venous insufficiency (chronic) (peripheral): Secondary | ICD-10-CM | POA: Diagnosis not present

## 2021-11-03 DIAGNOSIS — E1151 Type 2 diabetes mellitus with diabetic peripheral angiopathy without gangrene: Secondary | ICD-10-CM | POA: Diagnosis not present

## 2021-11-03 DIAGNOSIS — I890 Lymphedema, not elsewhere classified: Secondary | ICD-10-CM | POA: Diagnosis not present

## 2021-11-03 DIAGNOSIS — I110 Hypertensive heart disease with heart failure: Secondary | ICD-10-CM | POA: Diagnosis not present

## 2021-11-05 DIAGNOSIS — I110 Hypertensive heart disease with heart failure: Secondary | ICD-10-CM | POA: Diagnosis not present

## 2021-11-05 DIAGNOSIS — I890 Lymphedema, not elsewhere classified: Secondary | ICD-10-CM | POA: Diagnosis not present

## 2021-11-05 DIAGNOSIS — I5033 Acute on chronic diastolic (congestive) heart failure: Secondary | ICD-10-CM | POA: Diagnosis not present

## 2021-11-05 DIAGNOSIS — E1151 Type 2 diabetes mellitus with diabetic peripheral angiopathy without gangrene: Secondary | ICD-10-CM | POA: Diagnosis not present

## 2021-11-05 DIAGNOSIS — I872 Venous insufficiency (chronic) (peripheral): Secondary | ICD-10-CM | POA: Diagnosis not present

## 2021-11-07 DIAGNOSIS — M25551 Pain in right hip: Secondary | ICD-10-CM | POA: Diagnosis not present

## 2021-11-07 DIAGNOSIS — G894 Chronic pain syndrome: Secondary | ICD-10-CM | POA: Diagnosis not present

## 2021-11-07 DIAGNOSIS — M25552 Pain in left hip: Secondary | ICD-10-CM | POA: Diagnosis not present

## 2021-11-07 DIAGNOSIS — M542 Cervicalgia: Secondary | ICD-10-CM | POA: Diagnosis not present

## 2021-11-07 DIAGNOSIS — M545 Low back pain, unspecified: Secondary | ICD-10-CM | POA: Diagnosis not present

## 2021-11-07 DIAGNOSIS — M25562 Pain in left knee: Secondary | ICD-10-CM | POA: Diagnosis not present

## 2021-11-07 DIAGNOSIS — M79661 Pain in right lower leg: Secondary | ICD-10-CM | POA: Diagnosis not present

## 2021-11-07 DIAGNOSIS — Z79891 Long term (current) use of opiate analgesic: Secondary | ICD-10-CM | POA: Diagnosis not present

## 2021-11-07 DIAGNOSIS — M1711 Unilateral primary osteoarthritis, right knee: Secondary | ICD-10-CM | POA: Diagnosis not present

## 2021-11-07 DIAGNOSIS — M25561 Pain in right knee: Secondary | ICD-10-CM | POA: Diagnosis not present

## 2021-11-07 DIAGNOSIS — J3089 Other allergic rhinitis: Secondary | ICD-10-CM | POA: Diagnosis not present

## 2021-11-07 DIAGNOSIS — M25512 Pain in left shoulder: Secondary | ICD-10-CM | POA: Diagnosis not present

## 2021-11-11 ENCOUNTER — Telehealth: Payer: Self-pay

## 2021-11-11 DIAGNOSIS — I890 Lymphedema, not elsewhere classified: Secondary | ICD-10-CM | POA: Diagnosis not present

## 2021-11-11 DIAGNOSIS — I872 Venous insufficiency (chronic) (peripheral): Secondary | ICD-10-CM | POA: Diagnosis not present

## 2021-11-11 DIAGNOSIS — I5033 Acute on chronic diastolic (congestive) heart failure: Secondary | ICD-10-CM | POA: Diagnosis not present

## 2021-11-11 DIAGNOSIS — I110 Hypertensive heart disease with heart failure: Secondary | ICD-10-CM | POA: Diagnosis not present

## 2021-11-11 DIAGNOSIS — E1151 Type 2 diabetes mellitus with diabetic peripheral angiopathy without gangrene: Secondary | ICD-10-CM | POA: Diagnosis not present

## 2021-11-11 NOTE — Telephone Encounter (Signed)
Orders needed for mechanical compression stocking.   Nurse Candis Schatz [phone]

## 2021-11-16 NOTE — Telephone Encounter (Signed)
Ok done hardcopy to [employer]

## 2021-11-17 ENCOUNTER — Telehealth: Payer: Self-pay | Admitting: Licensed Clinical Social Worker

## 2021-11-17 ENCOUNTER — Telehealth: Payer: Self-pay | Admitting: Cardiology

## 2021-11-17 DIAGNOSIS — I110 Hypertensive heart disease with heart failure: Secondary | ICD-10-CM | POA: Diagnosis not present

## 2021-11-17 DIAGNOSIS — I5033 Acute on chronic diastolic (congestive) heart failure: Secondary | ICD-10-CM | POA: Diagnosis not present

## 2021-11-17 DIAGNOSIS — I890 Lymphedema, not elsewhere classified: Secondary | ICD-10-CM | POA: Diagnosis not present

## 2021-11-17 DIAGNOSIS — I872 Venous insufficiency (chronic) (peripheral): Secondary | ICD-10-CM | POA: Diagnosis not present

## 2021-11-17 DIAGNOSIS — E1151 Type 2 diabetes mellitus with diabetic peripheral angiopathy without gangrene: Secondary | ICD-10-CM | POA: Diagnosis not present

## 2021-11-17 NOTE — Progress Notes (Signed)
Heart and Vascular Care Navigation  11/17/2021  Maria [name] [birth year] [id number]  Reason for Referral:  Engaged with patient by telephone for initial visit for Heart and Vascular Care Coordination.                                                                                                   Assessment:                                     LCSW received a referral verbally from Judson Roch, [place], who shares that pt has called expressing issues with swelling and needs to be able to get to her appt this Thursday w/ Dr. Percival Spanish. Unfortuantely, pt shared that she has "run out of rides." LCSW was able to call and reach pt via telephone at [phone]. I introduced self, role, reason for call. Pt confirmed that she generally utilizes [facility] Medicare transportation to get to her appts but has used all her rides for the year. I explained that we do have transportation services through [facility] available for situations such as this. Shared what to expect and that I would send her referral in and schedule ride. Confirmed home address and emergency contacts. Pt will get automated reminder call prior to first ride. I will have her sign the waiver here. She is able to get into and out of a car currently. She shares that if she feels anything changes that she will go to emergency room for evaluation.   HRT/VAS Care Coordination     Patients Home Cardiology Office [place] Team Social Worker   Social Worker Name: Valeda Malm, [facility] [phone]   Living arrangements for the past 2 months Apartment   Lives with: Self   Patient Current Insurance Coverage Managed Medicare   Patient Has Concern With Paying Medical Bills No   Does Patient Have Prescription Coverage? Yes   Home Assistive Devices/Equipment Walker (specify type); Cane (specify quad or straight)   DME Agency NA   [place]   Current home services DME  cane, rollator        Social History:                                                                             SDOH Screenings   Alcohol Screen: Not on file  Depression (PHQ2-9): Low Risk    PHQ-2 Score: 0  Financial Resource Strain: Not on file  Food Insecurity: No Food Insecurity   Worried About Running Out of Food in the Last Year: Never true   Ran Out of Food in the Last Year: Never true  Housing: Not on file  Physical Activity: Not on  file  Social Connections: Not on file  Stress: Not on file  Tobacco Use: Medium Risk   Smoking Tobacco Use: Former   Smokeless Tobacco Use: Never   Passive Exposure: Not on file  Transportation Needs: No Transportation Needs   Lack of Transportation (Medical): No   Lack of Transportation (Non-Medical): No    SDOH Interventions: Transportation:   Transportation Interventions: Financial planner   Follow-up plan:   LCSW scheduled ride via [place], [employer] and will request that pt sign during appt. LCSW will assist as needed w/ ride home. Pt mailed a copy of transportation resources, their card and my card. Encouraged her to call if any additional questions/concerns.

## 2021-11-17 NOTE — Telephone Encounter (Signed)
Pt c/o swelling: STAT is pt has developed SOB within 24 hours  If swelling, where is the swelling located? Legs, knees and feet   How much weight have you gained and in what time span? 3 pounds since yesterday   Have you gained 3 pounds in a day or 5 pounds in a week? Yes   Do you have a log of your daily weights (if so, list)? 11/27 365                               11/28 368  Are you currently taking a fluid pill? 80 mg of furosemide twice a day   Are you currently SOB? no  Have you traveled recently? no

## 2021-11-17 NOTE — Telephone Encounter (Signed)
Spoke with patient. She reports weight gain of 3 pounds since yesterday. Her weight today is 368 pounds. Blood pressure 136/83, Pulse 81. Reports edema lower extremities from knees to ankles and SOB when she "moves around". She is taking the prescribed torsemide that results in small amounts of urine. Gave report to Dr. Debara Pickett who recommended patient to take torsemide 80mg  three times a day and to keep her 11/20/21 appointment with Dr. Percival Spanish. Patient refused to take the extra dose of torsemide because she is not voiding a lot and she is worried about her kidney function. Also stated she is not able to keep her appointment with Dr. Percival Spanish because she doesn't have any mores rides covered by insurance. Spoke with social worker Michiel Cowboy who made arrangements for patient to keep her appointment with Dr. Percival Spanish. Pt. using heart-healthy diet and is avoiding sodium. Sleeps in a recliner and elevates legs. Advised patient to go to the ED if her SOB gets worse. Pt. voiced understanding of keeping her appointments with cardiologist and nephrologist, as well as going to the ED if SOB gets worse.

## 2021-11-17 NOTE — Telephone Encounter (Signed)
Faxed and Candis Schatz notified.

## 2021-11-18 NOTE — Progress Notes (Signed)
Cardiology Office Note   Date:  11/20/2021   ID:  Maria [name], DOB February 07, 1956, MRN [id number]  PCP:  Biagio Borg, MD  Cardiologist:   Minus Breeding, MD  Chief Complaint  Patient presents with   Edema     History of Present Illness: Maria [name] is a 65 y.o. female who presents for follow up of acute on chronic diastolic dysfunction. She was in the hospital in late April with abdominal pain.  We saw her for acute on chronic diastolic dysfunction.  She had some mild acute on chronic renal insufficiency.   We stopped her metolazone and switched Lasix to torsemide.  She was in the ED on 5/12 because she thought that she was volume overloaded and she had gained some weight.  However, BNP and CXR and exam did not suggest acute volume overload.   Her last echocardiogram 03/08/21 showed EF 60-65%, G1DD, no RWMA, elevated LVEDP, and no significant valvular abnormalities. Does not appear to have had prior ischemic testing though does have evidence of prior coronary artery calcifications (specifically LAD) noted on prior CTA Chest in 2020.   She called recently and had increased edema.  It looks like her weight is up about 13 pounds.  Her dry weight is really around 358 pounds.  She says she is really been watching her salt.  She puts her feet up in the recliner.  She does have some shortness of breath but that is not the predominant complaint and she is not really describing new PND or orthopnea.  When her weight goes up she says she has knee pain.  She gets around very slowly in her house with her morbid obesity and joint problems.  She moves with a walker.  She is not describing new chest pressure, neck or arm discomfort.  She is weighing herself routinely.  Physical therapist checks twice a week and a nurse once a week.  Of note she reports her blood pressures have been controlled.  Heart rates have been in the 60s to 70s hospital saturations in the mid 90s to high 90s.   Past Medical  History:  Diagnosis Date   Acute lymphadenitis 2011   ALLERGIC RHINITIS 08/10/2007   ANXIETY 12/06/2007   Atherosclerotic peripheral vascular disease ([place]) 06/13/2013   Aorta on CT June 2014   Cellulitis 2011   Cervical disc disease 03/09/2012   CHF (congestive heart failure) ([place])    Chronic pain 03/09/2012   DEPRESSION 12/06/2007   Diabetes ([place])    DIVERTICULOSIS, [place] 12/06/2007   GERD 12/06/2007   Hepatitis    age 57 hepatitis A   HLD (hyperlipidemia) 05/24/2019   HYPERTENSION 12/06/2007   Lumbar disc disease 03/09/2012   Mesenteric adenitis    MRSA 2006   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 2010   [place]  may 24th 2010   COLONOSCOPY WITH PROPOFOL N/A 07/10/2016   Procedure: COLONOSCOPY WITH PROPOFOL;  Surgeon: Manus Gunning, MD;  Location: [facility];  Service: Gastroenterology;  Laterality: N/A;   COLONOSCOPY WITH PROPOFOL N/A 09/26/2019   Procedure: COLONOSCOPY WITH PROPOFOL;  Surgeon: Yetta Flock, MD;  Location: [facility];  Service: Gastroenterology;  Laterality: N/A;   ESOPHAGOGASTRODUODENOSCOPY (EGD) WITH PROPOFOL N/A 07/10/2016  Procedure: ESOPHAGOGASTRODUODENOSCOPY (EGD) WITH PROPOFOL;  Surgeon: Manus Gunning, MD;  Location: [facility] END[facility];  Service: Gastroenterology;  Laterality: N/A;   POLYPECTOMY  09/26/2019   Procedure: POLYPECTOMY;  Surgeon: Yetta Flock, MD;  Location: [facility] END[facility];  Service: Gastroenterology;;   s/p ovary cyst     s/p right knee arthroscopy     Dr. Mardelle Matte ortho     Current Outpatient Medications  Medication Sig Dispense Refill   Accu-Chek Softclix Lancets lancets Use as directed up to 4 times daily (Patient taking differently: 1 each by Other route in the morning, at noon, in the  evening, and at bedtime.) 100 each 5   acetaminophen (TYLENOL) 325 MG tablet Take 650 mg by mouth every 6 (six) hours as needed for headache or fever (pain).     albuterol (PROAIR HFA) 108 (90 Base) MCG/ACT inhaler Inhale 2 puffs into the lungs every 6 (six) hours as needed for wheezing or shortness of breath. 1 each 0   Blood Glucose Monitoring Suppl (ACCU-CHEK GUIDE) w/Device KIT Use as directed (Patient taking differently: 1 each by Other route as directed.) 1 kit 0   Cholecalciferol (VITAMIN D3) 50 MCG (2000 UT) CAPS Take 2 capsules by mouth daily.     clonazePAM (KLONOPIN) 1 MG tablet TAKE 1 TABLET BY MOUTH 2 TIMES A DAY AS NEEDED FOR ANXIETY (Patient taking differently: Take 1 mg by mouth 2 (two) times daily as needed for anxiety.) 60 tablet 5   cyclobenzaprine (FLEXERIL) 10 MG tablet TAKE 1 TABLET BY MOUTH 2 TIMES A DAY AS NEEDED FOR MUSCLE SPASMS (Patient taking differently: Take 10 mg by mouth 2 (two) times daily as needed for muscle spasms.) 180 tablet 1   Fexofenadine HCl (ALLEGRA PO) Take 180 mg by mouth daily as needed (allergies).     glucose blood (ACCU-CHEK GUIDE) test strip Use as instructed up to 4 times daily (Patient taking differently: Use as instructed up to 4 times daily) 100 each 12   losartan (COZAAR) 50 MG tablet Take 1 tablet (50 mg total) by mouth daily. 90 tablet 3   Metamucil Fiber CHEW Chew 3 tablets by mouth at bedtime.     metolazone (ZAROXOLYN) 5 MG tablet Take 1 tablet (5 mg total) by mouth daily. For 3 days 3 tablet 0   metoprolol tartrate (LOPRESSOR) 25 MG tablet Take 25 mg by mouth 2 (two) times daily.     NARCAN 4 MG/0.1ML LIQD nasal spray kit Place 0.4 mg into the nose daily as needed (opioid overdose).     ondansetron (ZOFRAN ODT) 4 MG disintegrating tablet Take 1 tablet (4 mg total) by mouth every 4 (four) hours as needed for nausea or vomiting. 20 tablet 0   OVER THE COUNTER MEDICATION Fiber Gummies. Patient takes 3 gummies at bedtime.     Oxycodone HCl 10  MG TABS Take 1 tablet (10 mg total) 4 (four) times daily by mouth. Per [facility] (Patient taking differently: Take 10 mg by mouth every 4 (four) hours as needed (pain). Per [facility]) 30 tablet 0   pantoprazole (PROTONIX) 40 MG tablet TAKE ONE TABLET BY MOUTH DAILY (Patient taking differently: Take 40 mg by mouth daily.) 90 tablet 2   Polyethyl Glycol-Propyl Glycol 0.4-0.3 % SOLN Apply 1 drop to eye daily as needed (dry eyes).     polyethylene glycol powder (GLYCOLAX/MIRALAX) 17 GM/SCOOP powder Take 17 g by mouth daily as needed for mild constipation. 510 g 0   potassium chloride SA (  KLOR-CON) 20 MEQ tablet Take 1 tablet (20 mEq total) by mouth 2 (two) times daily. 180 tablet 3   rOPINIRole (REQUIP) 1 MG tablet Take 1 tablet (1 mg total) by mouth 3 (three) times daily. 90 tablet 5   Simethicone (GAS-X EXTRA STRENGTH PO) Take 1 tablet by mouth daily as needed (gas).     torsemide (DEMADEX) 20 MG tablet Take 20 mg  4 tablets ( 80 mg ) twice a day.May take a extra 20 mg daily if weight over 370 lbs. 180 tablet 3   metFORMIN (GLUCOPHAGE) 500 MG tablet Take 1 tablet (500 mg total) by mouth 2 (two) times daily with a meal. (Patient not taking: Reported on 11/20/2021) 180 tablet 2   No current facility-administered medications for this visit.    Allergies:   Amoxicillin-pot clavulanate, Adhesive [tape], Codeine, Crestor [rosuvastatin calcium], Morphine and related, Vicodin [hydrocodone-acetaminophen], and Latex    ROS:  Please see the history of present illness.   Otherwise, review of systems are positive for none.   All other systems are reviewed and negative.    PHYSICAL EXAM: VS:  BP (!) 130/58   Pulse 84   Ht $R'5\' 5"'xR$  (1.651 m)   Wt (!) 371 lb 9.6 oz (168.6 kg)   SpO2 96%   BMI 61.84 kg/m  , BMI Body mass index is 61.84 kg/m. GEN:  No distress NECK:  No jugular venous distention at 90 degrees, waveform within normal limits, carotid upstroke brisk and symmetric, no bruits,  no thyromegaly LYMPHATICS:  No cervical adenopathy LUNGS:  Clear to auscultation bilaterally BACK:  No CVA tenderness CHEST:  Unremarkable HEART:  S1 and S2 within normal limits, no S3, no S4, no clicks, no rubs, no murmurs ABD:  Positive bowel sounds normal in frequency in pitch, no bruits, no rebound, no guarding, unable to assess midline mass or bruit with the patient seated. EXT:  2 plus pulses throughout, severe edema, no cyanosis no clubbing SKIN:  No rashes no nodules NEURO:  Cranial nerves II through XII grossly intact, motor grossly intact throughout PSYCH:  Cognitively intact, oriented to person place and time   EKG:  EKG is not ordered today.   Recent Labs: 12/31/2020: TSH 1.45 10/09/2021: Magnesium 1.8 10/16/2021: ALT 11; B Natriuretic Peptide 32.0; BUN 18; Creatinine, Ser 1.18; Hemoglobin 12.3; Platelets 288; Potassium 3.6; Sodium 139    Lipid Panel    Component Value Date/Time   CHOL 147 07/07/2021 1510   TRIG 140.0 07/07/2021 1510   TRIG 126 12/31/2006 0847   HDL 43.30 07/07/2021 1510   CHOLHDL 3 07/07/2021 1510   VLDL 28.0 07/07/2021 1510   LDLCALC 76 07/07/2021 1510   LDLDIRECT 127.0 01/25/2019 1423      Wt Readings from Last 3 Encounters:  11/20/21 (!) 371 lb 9.6 oz (168.6 kg)  10/29/21 (!) 369 lb (167.4 kg)  10/09/21 (!) 361 lb 9.6 oz (164 kg)      Other studies Reviewed: Additional studies/ records that were reviewed today include: Labs Review of the above records demonstrates:  Please see elsewhere in the note.     ASSESSMENT AND PLAN:   Acute on chronic diastolic CHF:    She is starting to have volume overload again.  She reports medication and diet compliance.  I will check a basic metabolic profile today.  I will add Zaroxolyn 5 mg daily for 3 days and we will double her potassium to 80 mEq daily.  She will then need to come back  next week for a weight and a basic metabolic profile.  She is to really look and see if there is any salt in her  diet and keep her feet elevated.   Essential hypertension  :   The blood pressure seems to be well controlled.  No change in therapy.    Type 2 diabetes : A1c was 6.1.  No change in therapy  Current medicines are reviewed at length with the patient today.  The patient does not have concerns regarding medicines.  The following changes have been made: As above  Labs/ tests ordered today include:   Orders Placed This Encounter  Procedures   Basic metabolic panel   Basic metabolic panel    Disposition:   FU with APP in 3 months. Lab and nurse check next week.   Signed, Minus Breeding, MD  11/20/2021 4:45 PM    [facility]

## 2021-11-19 ENCOUNTER — Other Ambulatory Visit: Payer: Self-pay | Admitting: Internal Medicine

## 2021-11-19 NOTE — Telephone Encounter (Signed)
Please refill as per office routine med refill policy (all routine meds to be refilled for 3 mo or monthly (per pt preference) up to one year from last visit, then month to month grace period for 3 mo, then further med refills will have to be denied) ? ?

## 2021-11-20 ENCOUNTER — Telehealth: Payer: Self-pay | Admitting: Licensed Clinical Social Worker

## 2021-11-20 ENCOUNTER — Ambulatory Visit (INDEPENDENT_AMBULATORY_CARE_PROVIDER_SITE_OTHER): Payer: Medicare Other | Admitting: Cardiology

## 2021-11-20 ENCOUNTER — Other Ambulatory Visit: Payer: Self-pay

## 2021-11-20 ENCOUNTER — Encounter: Payer: Self-pay | Admitting: Cardiology

## 2021-11-20 VITALS — BP 130/58 | HR 84 | Ht 65.0 in | Wt 371.6 lb

## 2021-11-20 DIAGNOSIS — M7989 Other specified soft tissue disorders: Secondary | ICD-10-CM | POA: Diagnosis not present

## 2021-11-20 DIAGNOSIS — Z79899 Other long term (current) drug therapy: Secondary | ICD-10-CM | POA: Diagnosis not present

## 2021-11-20 DIAGNOSIS — I5033 Acute on chronic diastolic (congestive) heart failure: Secondary | ICD-10-CM | POA: Diagnosis not present

## 2021-11-20 MED ORDER — METOLAZONE 5 MG PO TABS: 5.0000 mg | ORAL_TABLET | Freq: Every day | ORAL | 0 refills | Status: DC

## 2021-11-20 NOTE — Telephone Encounter (Addendum)
LCSW f/u with pt this afternoon and confirmed she returned home.  Apologized again for the difficulty getting her here this morning- she is okay and glad we could still see her; she feels confident in the f/u plan. I have gone ahead and scheduled door to door transportation for her next appointment as with her mobility it may be easier for her to manage. This ride was confirmed w/ Suezanne Jacquet and their team will call pt w/ a reminder on 12/8. The transportation coordinator is available for any additional questions at [phone] and her signed waiver from today was sent through to Transportation Services ([email or web address]). Pt aware and we will speak next week for additional f/u.   Westley Hummer, MSW, [place]  [phone]- work cell phone (preferred) [phone]- desk phone

## 2021-11-20 NOTE — Patient Instructions (Addendum)
Medication Instructions:  Take Metolazone 5 mg daily for 3 days. Also, increase potassium to 40 meq twice a day for those 3 days, then resume to 20 meq twice a day thereafter.    *If you need a refill on your cardiac medications before your next appointment, please call your pharmacy*   Lab Work: Your physician recommends lab work today (BMP), then 1 week (BMP)  If you have labs (blood work) drawn today and your tests are completely normal, you will receive your results only by: [place] (if you have MyChart) OR A paper copy in the mail If you have any lab test that is abnormal or we need to change your treatment, we will call you to review the results.   Testing/Procedures: None ordered today   Follow-Up: At [facility], you and your health needs are our priority.  As part of our continuing mission to provide you with exceptional heart care, we have created designated Provider Care Teams.  These Care Teams include your primary Cardiologist (physician) and Advanced Practice Providers (APPs -  Physician Assistants and Nurse Practitioners) who all work together to provide you with the care you need, when you need it.  We recommend signing up for the patient portal called "MyChart".  Sign up information is provided on this After Visit Summary.  MyChart is used to connect with patients for Virtual Visits (Telemedicine).  Patients are able to view lab/test results, encounter notes, upcoming appointments, etc.  Non-urgent messages can be sent to your provider as well.   To learn more about what you can do with MyChart, go to [email or web address].    Your next appointment:   2 month(s)  The format for your next appointment:   In Person  Provider:   Minus Breeding, MD    Your physician recommends that you schedule a nurse visit for weight check.

## 2021-11-20 NOTE — Telephone Encounter (Signed)
Multiple calls made to pt and Transportation Services regarding ride scheduled for this morning. Due to technical issues on [employer] pt has been rescheduled to multiple drivers. Pt aware of this issue and that I have updated Dr. Percival Spanish and his team w/ transportation challenges and delay.   Westley Hummer, MSW, [place]  [phone]- work cell phone (preferred) [phone]- desk phone

## 2021-11-21 ENCOUNTER — Other Ambulatory Visit: Payer: Self-pay | Admitting: Cardiology

## 2021-11-21 DIAGNOSIS — E1151 Type 2 diabetes mellitus with diabetic peripheral angiopathy without gangrene: Secondary | ICD-10-CM | POA: Diagnosis not present

## 2021-11-21 DIAGNOSIS — I872 Venous insufficiency (chronic) (peripheral): Secondary | ICD-10-CM | POA: Diagnosis not present

## 2021-11-21 DIAGNOSIS — I5033 Acute on chronic diastolic (congestive) heart failure: Secondary | ICD-10-CM | POA: Diagnosis not present

## 2021-11-21 DIAGNOSIS — I110 Hypertensive heart disease with heart failure: Secondary | ICD-10-CM | POA: Diagnosis not present

## 2021-11-21 DIAGNOSIS — I890 Lymphedema, not elsewhere classified: Secondary | ICD-10-CM | POA: Diagnosis not present

## 2021-11-21 LAB — BASIC METABOLIC PANEL
BUN/Creatinine Ratio: 12 (ref 12–28)
BUN: 14 mg/dL (ref 8–27)
CO2: 29 mmol/L (ref 20–29)
Calcium: 9.2 mg/dL (ref 8.7–10.3)
Chloride: 96 mmol/L (ref 96–106)
Creatinine, Ser: 1.14 mg/dL — ABNORMAL HIGH (ref 0.57–1.00)
Glucose: 95 mg/dL (ref 70–99)
Potassium: 4.1 mmol/L (ref 3.5–5.2)
Sodium: 141 mmol/L (ref 134–144)
eGFR: 54 mL/min/{1.73_m2} — ABNORMAL LOW (ref >59)

## 2021-11-24 ENCOUNTER — Other Ambulatory Visit: Payer: Self-pay | Admitting: Internal Medicine

## 2021-11-24 ENCOUNTER — Ambulatory Visit (INDEPENDENT_AMBULATORY_CARE_PROVIDER_SITE_OTHER): Payer: Medicare Other | Admitting: *Deleted

## 2021-11-24 DIAGNOSIS — I5030 Unspecified diastolic (congestive) heart failure: Secondary | ICD-10-CM

## 2021-11-24 DIAGNOSIS — E1169 Type 2 diabetes mellitus with other specified complication: Secondary | ICD-10-CM

## 2021-11-24 DIAGNOSIS — I5032 Chronic diastolic (congestive) heart failure: Secondary | ICD-10-CM | POA: Diagnosis not present

## 2021-11-24 DIAGNOSIS — N1831 Chronic kidney disease, stage 3a: Secondary | ICD-10-CM

## 2021-11-24 DIAGNOSIS — I129 Hypertensive chronic kidney disease with stage 1 through stage 4 chronic kidney disease, or unspecified chronic kidney disease: Secondary | ICD-10-CM | POA: Diagnosis not present

## 2021-11-24 DIAGNOSIS — E1122 Type 2 diabetes mellitus with diabetic chronic kidney disease: Secondary | ICD-10-CM | POA: Diagnosis not present

## 2021-11-24 DIAGNOSIS — N390 Urinary tract infection, site not specified: Secondary | ICD-10-CM | POA: Diagnosis not present

## 2021-11-24 NOTE — Patient Instructions (Signed)
Visit Information  Thank you for taking time to talk with me today. Please don't hesitate to contact me if I can be of assistance to you before our next scheduled telephone appointment.  Below are the goals we discussed today:  Patient Self-Care Activities: Patient [place] will: Take medications as prescribed Attend all scheduled provider appointments Call pharmacy for medication refills Call provider office for new concerns or questions Keep up the great work monitoring and writing down on paper daily weights at home: today you reported a weight of 364 lbs Continue to follow prescribed diet: low carb/ low sugar; heart healthy/ low salt; and will follow fluid restrictions of 48- 64 ounces per day Continue to talk to cardiology provider about the concerns you are having with your fluid pill not pulling off fluid effectively Watch daily for increased feeling of shortness of breath, swelling in feet, ankles and legs every day: if you have an increased amount of shortness of breath or swelling, make a note of it and let your cardiology provider know if it persists Continue to keep legs up while sitting and wear compression stockings during daytime hours Stay as active as possible without over-doing: rest if you start to feel tired or "winded"/ short of breath Continue to monitor and write down on paper daily fasting blood sugars several times each week Continue to work with home health team: RN once/ week Continue your efforts to prevent falls- use your walker when you are walking  Our next scheduled telephone follow up visit/ appointment with care management team member is scheduled on:  Thursday, December 25, 2021 at 2:15 pm  If you need to cancel or re-schedule our visit, please call [phone] and our care guide team will be happy to assist you.   I look forward to hearing about your progress.   [name], RN, BSN, [place] [phone]: direct office  If you are experiencing a Mental Health or [place] or need someone to talk to, please  call the Suicide and Crisis Lifeline: 988 call the [place] National Suicide Prevention Lifeline: (385) 826-0550 or TTY: 229-076-2136 TTY 937-528-9690) to talk to a trained counselor call 1-800-273-TALK (toll free, 24 hour hotline) go to [facility] [place] [phone]) call 911   Patient verbalizes understanding of instructions provided today and agrees to view in MyChart   Chronic Kidney Disease, Adult Chronic kidney disease is when lasting damage happens to the kidneys slowly over a long time. The kidneys help to: Make pee (urine). Make hormones. Keep the right amount of fluids and chemicals in the body. Most often, this disease does not go away. You must take steps to help keep the kidney damage from getting worse. If steps are not taken, the kidneys might stop working forever. What are the causes? Diabetes. High blood pressure. Diseases that affect the heart and blood vessels. Other kidney diseases. Diseases of the body's disease-fighting system. A problem with the flow of pee. Infections of the organs that make pee, store it, and take it out of the body. Swelling or irritation of your blood vessels. What increases the risk? Getting older. Having someone in your family who has kidney disease or kidney failure. Having a disease caused by genes. Taking medicines often that harm the kidneys. Being near or having contact with harmful substances. Being very overweight. Using tobacco now or in the past. What are the signs or  symptoms? Feeling very tired. Having a swollen face, legs, ankles, or feet. Feeling like you may vomit or vomiting. Not feeling hungry. Being confused or not able to focus. Twitches and cramps in the leg muscles or other muscles. Dry, itchy skin. A taste of  metal in your mouth. Making less pee, or making more pee. Shortness of breath. Trouble sleeping. You may also become anemic or get weak bones. Anemic means there is not enough red blood cells or hemoglobin in your blood. You may get symptoms slowly. You may not notice them until the kidney damage gets very bad. How is this treated? Often, there is no cure for this disease. Treatment can help with symptoms and help keep the disease from getting worse. You may need to: Avoid alcohol. Avoid foods that are high in salt, potassium, phosphorous, and protein. Take medicines for symptoms and to help control other conditions. Have dialysis. This treatment gets harmful waste out of your body. Treat other problems that cause your kidney disease or make it worse. Follow these instructions at home: Medicines Take over-the-counter and prescription medicines only as told by your doctor. Do not take any new medicines, vitamins, or supplements unless your doctor says it is okay. Lifestyle  Do not smoke or use any products that contain nicotine or tobacco. If you need help quitting, ask your doctor. If you drink alcohol: Limit how much you use to: 0-1 drink a day for women who are not pregnant. 0-2 drinks a day for men. Know how much alcohol is in your drink. In the [place]., one drink equals one 12 oz bottle of beer (355 mL), one 5 oz glass of wine (148 mL), or one 1 oz glass of hard liquor (44 mL). Stay at a healthy weight. If you need help losing weight, ask your doctor. General instructions  Follow instructions from your doctor about what you cannot eat or drink. Track your blood pressure at home. Tell your doctor about any changes. If you have diabetes, track your blood sugar. Exercise at least 30 minutes a day, 5 days a week. Keep your shots (vaccinations) up to date. Keep all follow-up visits. Where to find more information American Association of Kidney Patients: BombTimer.gl National Kidney  Foundation: www.kidney.[place]: https://mathis.com/ Life Options: www.lifeoptions.org Kidney School: www.kidneyschool.org Contact a doctor if: Your symptoms get worse. You get new symptoms. Get help right away if: You get symptoms of end-stage kidney disease. These include: Headaches. Losing feeling in your hands or feet. Easy bruising. Having hiccups often. Chest pain. Shortness of breath. Lack of menstrual periods, in women. You have a fever. You make less pee than normal. You have pain or you bleed when you pee or poop. These symptoms may be an emergency. Get help right away. Call your local emergency services (911 in the [place] Do not wait to see if the symptoms will go away. Do not drive yourself to the hospital. Summary Chronic kidney disease is when lasting damage happens to the kidneys slowly over a long time. Causes of this disease include diabetes and high blood pressure. Often, there is no cure for this disease. Treatment can help symptoms and help keep the disease from getting worse. Treatment may involve lifestyle changes, medicines, and dialysis. This information is not intended to replace advice given to you by your health care provider. Make sure you discuss any questions you have with your health care provider. Document Revised: 03/13/2020 Document Reviewed: 03/13/2020 Elsevier Patient Education  [place].

## 2021-11-24 NOTE — Chronic Care Management (AMB) (Signed)
Chronic Care Management   CCM RN Visit Note  11/24/2021 Name: Maria [name] MRN: [id number] DOB: January 04, 1956  Subjective: Maria [name] is a 65 y.o. year old female who is a primary care patient of Biagio Borg, MD. The care management team was consulted for assistance with disease management and care coordination needs.    Engaged with patient by telephone for follow up visit in response to provider referral for case management and/or care coordination services.   Consent to Services:  The patient was given information about Chronic Care Management services, agreed to services, and gave verbal consent prior to initiation of services.  Please see initial visit note for detailed documentation.  Patient agreed to services and verbal consent obtained.   Assessment: Review of patient past medical history, allergies, medications, health status, including review of consultants reports, laboratory and other test data, was performed as part of comprehensive evaluation and provision of chronic care management services.   SDOH (Social Determinants of Health) assessments and interventions performed:  SDOH Interventions    Flowsheet Row Most Recent Value  SDOH Interventions   Food Insecurity Interventions Intervention Not Indicated  Transportation Interventions Intervention Not Indicated  [Continues using insurance transportation benefit for provider appointments]      CCM Care Plan Allergies  Allergen Reactions   Amoxicillin-Pot Clavulanate Nausea And Vomiting    Projectile vomiting Did it involve swelling of the face/tongue/throat, SOB, or low BP? No Did it involve sudden or severe rash/hives, skin peeling, or any reaction on the inside of your mouth or nose? No Did you need to seek medical attention at a hospital or doctor's office? No When did it last happen?      20-30 years If all above answers are "NO", may proceed with cephalosporin use.    Adhesive [Tape] Other (See Comments)     Tears skin off - use paper tape    Codeine Other (See Comments)    hallucinations   Crestor [Rosuvastatin Calcium]     Severe muscle cramps   Morphine And Related Nausea And Vomiting and Other (See Comments)    Migraine headaches   Vicodin [Hydrocodone-Acetaminophen] Other (See Comments)    hallucinations   Latex Rash   Outpatient Encounter Medications as of 11/24/2021  Medication Sig   Accu-Chek Softclix Lancets lancets Use as directed up to 4 times daily (Patient taking differently: 1 each by Other route in the morning, at noon, in the evening, and at bedtime.)   acetaminophen (TYLENOL) 325 MG tablet Take 650 mg by mouth every 6 (six) hours as needed for headache or fever (pain).   albuterol (PROAIR HFA) 108 (90 Base) MCG/ACT inhaler Inhale 2 puffs into the lungs every 6 (six) hours as needed for wheezing or shortness of breath.   Blood Glucose Monitoring Suppl (ACCU-CHEK GUIDE) w/Device KIT Use as directed (Patient taking differently: 1 each by Other route as directed.)   Cholecalciferol (VITAMIN D3) 50 MCG (2000 UT) CAPS Take 2 capsules by mouth daily.   clonazePAM (KLONOPIN) 1 MG tablet TAKE 1 TABLET BY MOUTH 2 TIMES A DAY AS NEEDED FOR ANXIETY (Patient taking differently: Take 1 mg by mouth 2 (two) times daily as needed for anxiety.)   cyclobenzaprine (FLEXERIL) 10 MG tablet TAKE 1 TABLET BY MOUTH 2 TIMES A DAY AS NEEDED FOR MUSCLE SPASMS (Patient taking differently: Take 10 mg by mouth 2 (two) times daily as needed for muscle spasms.)   Fexofenadine HCl (ALLEGRA PO) Take 180 mg by mouth daily  as needed (allergies).   glucose blood (ACCU-CHEK GUIDE) test strip Use as instructed up to 4 times daily (Patient taking differently: Use as instructed up to 4 times daily)   losartan (COZAAR) 50 MG tablet Take 1 tablet (50 mg total) by mouth daily.   Metamucil Fiber CHEW Chew 3 tablets by mouth at bedtime.   metFORMIN (GLUCOPHAGE) 500 MG tablet Take 1 tablet (500 mg total) by mouth 2 (two)  times daily with a meal. (Patient not taking: Reported on 11/20/2021)   metolazone (ZAROXOLYN) 5 MG tablet Take 1 tablet (5 mg total) by mouth daily. For 3 days   metoprolol tartrate (LOPRESSOR) 25 MG tablet Take 25 mg by mouth 2 (two) times daily.   NARCAN 4 MG/0.1ML LIQD nasal spray kit Place 0.4 mg into the nose daily as needed (opioid overdose).   ondansetron (ZOFRAN ODT) 4 MG disintegrating tablet Take 1 tablet (4 mg total) by mouth every 4 (four) hours as needed for nausea or vomiting.   OVER THE COUNTER MEDICATION Fiber Gummies. Patient takes 3 gummies at bedtime.   Oxycodone HCl 10 MG TABS Take 1 tablet (10 mg total) 4 (four) times daily by mouth. Per [facility] (Patient taking differently: Take 10 mg by mouth every 4 (four) hours as needed (pain). Per [facility])   pantoprazole (PROTONIX) 40 MG tablet TAKE ONE TABLET BY MOUTH DAILY (Patient taking differently: Take 40 mg by mouth daily.)   Polyethyl Glycol-Propyl Glycol 0.4-0.3 % SOLN Apply 1 drop to eye daily as needed (dry eyes).   polyethylene glycol powder (GLYCOLAX/MIRALAX) 17 GM/SCOOP powder Take 17 g by mouth daily as needed for mild constipation.   potassium chloride SA (KLOR-CON) 20 MEQ tablet Take 1 tablet (20 mEq total) by mouth 2 (two) times daily.   rOPINIRole (REQUIP) 1 MG tablet Take 1 tablet (1 mg total) by mouth 3 (three) times daily.   Simethicone (GAS-X EXTRA STRENGTH PO) Take 1 tablet by mouth daily as needed (gas).   torsemide (DEMADEX) 20 MG tablet Take 2 tablets (40 mg total) by mouth 2 (two) times daily.   [DISCONTINUED] DULoxetine (CYMBALTA) 60 MG capsule Take 60 mg by mouth daily.     No facility-administered encounter medications on file as of 11/24/2021.   Patient Active Problem List   Diagnosis Date Noted   Leg swelling 11/20/2021   Bilateral knee pain 10/29/2021   Hypernatremia 09/24/2021   CKD (chronic kidney disease), stage IIIa ([place]) 09/24/2021   Sacral decubitus ulcer, stage II  ([place]) 09/20/2021   Acute on chronic diastolic CHF (congestive heart failure) ([place]) 09/19/2021   Nail disorder 07/07/2021   Pre-ulcerative corn or callous 07/07/2021   Rash 09/98/3382   Diastolic congestive heart failure ([place]) 03/17/2021   Diabetes mellitus type 2 in obese ([place]) 03/11/2021   Vitamin D deficiency 01/01/2021   Medial epicondylitis 12/31/2020   Aortic atherosclerosis ([place]) 50/53/9767   Umbilical hernia 34/19/3790   Constipation 08/05/2020   Arthritis 06/12/2020   Hoarseness 05/01/2020   Vitamin B 12 deficiency 01/31/2020   Rosacea 01/31/2020   Neck swelling 01/31/2020   History of colonic polyps    Benign neoplasm of [place]    Nausea 08/31/2019   Throat pain 07/26/2019   HLD (hyperlipidemia) 05/24/2019   Leg cramps 05/24/2019   Lump in neck 05/24/2019   Left thyroid nodule 01/25/2019   Jerking movements of extremities 07/20/2018   Balance disorder 07/20/2018   Right knee pain 03/21/2018   OSA (obstructive sleep apnea) 03/21/2018  Oxygen desaturation 03/21/2018   Urinary symptom or sign 03/21/2018   Acquired lymphedema 01/25/2018   Gait disorder 01/25/2018   Peripheral edema 01/16/2018   SOB (shortness of breath) 01/15/2018   Dysphonia 12/07/2017   Encounter for well adult exam with abnormal findings 11/08/2017   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[place]) 05/31/2017   Graybeal polyp 08/11/2016   Abdominal pain, epigastric    Dysphagia    [place] cancer screening    ACE-inhibitor cough 12/05/2015   Restless legs syndrome 10/08/2015   Venous stasis dermatitis of both lower extremities 04/26/2015   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/17/2014   AR (allergic rhinitis) 10/17/2014   Fatty liver 10/17/2014   Chronic pain syndrome 09/19/2014   Post-traumatic osteoarthritis of both knees 09/19/2014   Spinal stenosis 09/19/2014   Depression, major, single episode, complete remission ([place]) 09/19/2014   Narcotic dependence ([place]) 09/19/2014   Spondylosis of lumbar region without  myelopathy or radiculopathy 03/09/2012   Lumbar disc disease 03/09/2012   Chronic pain 03/09/2012   Morbid obesity ([place]) 06/17/2011   Chronic low back pain 12/28/2008   Anxiety state 12/06/2007   Depression 12/06/2007   Essential hypertension 12/06/2007   Gastroesophageal reflux disease without esophagitis 12/06/2007   LOW BACK PAIN 12/06/2007   Conditions to be addressed/monitored:  CHF and DMII  Care Plan : RN Care Manager Plan of Care  Updates made by [name] Royalty, RN since 11/24/2021 12:00 AM     Problem: Chronic Disease Management Needs   Priority: Medium     Long-Range Goal: Development of plan of care for long term chronic disease management   Start Date: 08/26/2021  Expected End Date: 08/26/2022  Priority: Medium  Note:   Current Barriers:  Chronic Disease Management support and education needs related to CHF and DMII Fragile state of health, multiple progressing chronic health conditions Recent in-patient hospitalizations x 2 in Spring 2022 for CHF exacerbation/ weight gain at home, shortness of breath Hospitalization: September 30- September 25, 2021- CHF exacerbation; discharged with home health services in place; 11/24/21: patient reports ongoing home health RN services weekly; states home health PT has now signed off  RNCM Clinical Goal(s):  Patient will demonstrate ongoing health management independence for CHF and DMII  through collaboration with RN Care manager, provider, and care team.   Interventions: 1:1 collaboration with primary care provider regarding development and update of comprehensive plan of care as evidenced by provider attestation and co-signature Inter-disciplinary care team collaboration (see longitudinal plan of care) Evaluation of current treatment plan related to  self management and patient's adherence to plan as established by provider Chart reviewed including relevant office notes, upcoming scheduled appointments, and lab results  Heart  Failure Interventions:  (Status: Goal on Track (progressing): YES.) Basic overview and discussion of pathophysiology of Heart Failure reviewed Discussed importance of daily weight and advised patient to weigh and record daily Reviewed role of diuretics in prevention of fluid overload and management of heart failure Discussed the importance of keeping all appointments with provider Reviewed with patient recent cardiology provider office visit 11/20/21-- confirmed patient took "extra" metolazone x 3 days, as instructed: reports tolerated "okay;" states she lost "about 8 pounds" after taking; confirms she is now taking maintenance diuretic (torsemide) 40 mg po BID Reviewed with patient her recent daily weights at home- reports weight consistently between 359-366 lbs; reports weight today at "364 lbs:"  this is roughly patient's established baseline Confirmed no concerns with breathing status; continues to report "same swelling" in ankles- per baseline  Confirmed home health team active in care; patient actively participating in RN weekly visits; reports PT "signed off"  Confirmed no concerns around medications- patient continues to manage independently Confirms continues following low salt, heart healthy, carb-modified, low sugar diet Reinforced previously provided education around signs/ symptoms CHF yellow zone, importance of daily assessments at home, action plan for yellow CHF zone: patient remains able to verbalize all with minimal prompting-- however, she will continue to benefit from ongoing support and reinforcement Confirmed with patient her report that she has obtained the flu vaccine for 2022-23 flu/ winter season: obtained pneumonia vaccine as well-- positive reinforcement provided  Diabetes:  (Status: Goal on Track (progressing): YES.) Lab Results  Component Value Date   HGBA1C 6.1 (H) 09/20/2021  Assessed patient's understanding of A1c goal: <6.5% Reviewed prescribed diet with patient  low carb/ low sugar; heart healthy, low salt, fluid restricted- <48 ounces per day; confirms patient following diet recommendations; Counseled on importance of regular laboratory monitoring as prescribed;        Discussed plans with patient for ongoing care management follow up and provided patient with direct contact information for care management team;      Reviewed scheduled/upcoming provider appointments including: 11/24/21- (new patient) renal provider- [facility]; 11/25/21- orthopedic surgeon provider office visit; 11/26/21- annual eye exam; 11/28/21- cardiology nurse visit (lab); 12/05/21- [place] team; confirmed patient has plans to attend all as scheduled- continues using transportation benefit of insurance provider ;         Review of patient status, including review of consultants reports, relevant laboratory and other test results, and medications completed;       Assessed social determinant of health barriers;        no changes/ unmet concerns identified Reviewed recent blood sugars at home: monitoring 2-3 times per week fasting: reports recent ranges at home consistently between 96- 120: encouraged her ongoing monitoring/ recording of weekly blood sugars; confirms still not taking metformin due to concerns around CKD Discussed "talking points" to cover during this afternoon's (new patient) renal provider office visit  Patient Goals/Self-Care Activities: As evidenced by review of EHR, collaboration with care team, and patient reporting during CCM RN CM outreach,  Patient [place] will: Take medications as prescribed Attend all scheduled provider appointments Call pharmacy for medication refills Call provider office for new concerns or questions Keep up the great work monitoring and writing down on paper daily weights at home: today you reported a weight of 364 lbs Continue to follow prescribed diet: low carb/ low sugar; heart healthy/ low salt; and will follow fluid  restrictions of 48- 64 ounces per day Continue to talk to cardiology provider about the concerns you are having with your fluid pill not pulling off fluid effectively Watch daily for increased feeling of shortness of breath, swelling in feet, ankles and legs every day: if you have an increased amount of shortness of breath or swelling, make a note of it and let your cardiology provider know if it persists Continue to keep legs up while sitting and wear compression stockings during daytime hours Stay as active as possible without over-doing: rest if you start to feel tired or "winded"/ short of breath Continue to monitor and write down on paper daily fasting blood sugars several times each week Continue to work with home health team: RN once/ week Continue your efforts to prevent falls- use your walker when you are walking            Plan:  Telephone follow up appointment with care management team member scheduled for: Thursday, December 25, 2021 at 2:15 pm The patient has been provided with contact information for the care management team and has been advised to call with any health related questions or concerns  [name], RN, BSN, [place] [phone]: direct office

## 2021-11-24 NOTE — Telephone Encounter (Signed)
Please refill as per office routine med refill policy (all routine meds to be refilled for 3 mo or monthly (per pt preference) up to one year from last visit, then month to month grace period for 3 mo, then further med refills will have to be denied) ? ?

## 2021-11-25 DIAGNOSIS — M25562 Pain in left knee: Secondary | ICD-10-CM | POA: Diagnosis not present

## 2021-11-25 DIAGNOSIS — M25561 Pain in right knee: Secondary | ICD-10-CM | POA: Diagnosis not present

## 2021-11-26 ENCOUNTER — Telehealth: Payer: Self-pay | Admitting: Licensed Clinical Social Worker

## 2021-11-26 DIAGNOSIS — H2513 Age-related nuclear cataract, bilateral: Secondary | ICD-10-CM | POA: Diagnosis not present

## 2021-11-26 DIAGNOSIS — H52223 Regular astigmatism, bilateral: Secondary | ICD-10-CM | POA: Diagnosis not present

## 2021-11-26 DIAGNOSIS — H5203 Hypermetropia, bilateral: Secondary | ICD-10-CM | POA: Diagnosis not present

## 2021-11-26 DIAGNOSIS — H524 Presbyopia: Secondary | ICD-10-CM | POA: Diagnosis not present

## 2021-11-26 NOTE — Telephone Encounter (Signed)
LCSW f/u with pt to remind her that she has an appt w/ [place] Friday 12/09 and that there has been a door to door ride scheduled for her. Confirmed appt time w/ front desk staff and shared w/ pt.  Sent f/u email to Transportation Services to confirm they had the ride appt time for Friday.  She is aware they will call her the day before to confirm ride.   Westley Hummer, MSW, [place]  [phone]- work cell phone (preferred) [phone]- desk phone

## 2021-11-27 NOTE — Telephone Encounter (Signed)
LCSW spoke with Suezanne Jacquet, [employer], who confirms that he has scheduled pt pick up for about 11:45am. She will be picked up from office to go home roughly at 12:45pm. They will call and remind pt today.   Westley Hummer, MSW, [place]  [phone]- work cell phone (preferred) [phone]- desk phone

## 2021-11-28 ENCOUNTER — Ambulatory Visit (INDEPENDENT_AMBULATORY_CARE_PROVIDER_SITE_OTHER): Payer: Medicare Other | Admitting: Cardiology

## 2021-11-28 ENCOUNTER — Other Ambulatory Visit: Payer: Self-pay

## 2021-11-28 VITALS — Wt 363.4 lb

## 2021-11-28 DIAGNOSIS — I5033 Acute on chronic diastolic (congestive) heart failure: Secondary | ICD-10-CM

## 2021-11-28 DIAGNOSIS — I872 Venous insufficiency (chronic) (peripheral): Secondary | ICD-10-CM | POA: Diagnosis not present

## 2021-11-28 DIAGNOSIS — E1151 Type 2 diabetes mellitus with diabetic peripheral angiopathy without gangrene: Secondary | ICD-10-CM | POA: Diagnosis not present

## 2021-11-28 DIAGNOSIS — R252 Cramp and spasm: Secondary | ICD-10-CM

## 2021-11-28 DIAGNOSIS — R600 Localized edema: Secondary | ICD-10-CM

## 2021-11-28 DIAGNOSIS — I5032 Chronic diastolic (congestive) heart failure: Secondary | ICD-10-CM | POA: Diagnosis not present

## 2021-11-28 DIAGNOSIS — I110 Hypertensive heart disease with heart failure: Secondary | ICD-10-CM | POA: Diagnosis not present

## 2021-11-28 DIAGNOSIS — I890 Lymphedema, not elsewhere classified: Secondary | ICD-10-CM | POA: Diagnosis not present

## 2021-11-28 NOTE — Progress Notes (Signed)
Reason for visit: weight check. Pt states that she has been having palpitations today  Name of MD requesting visit: Hochrein  Assessment and plan per MD: per Dr Percival Spanish continue weight loss. Also, continue to monitor palpitations. Pt verbalized understanding

## 2021-11-29 ENCOUNTER — Telehealth: Payer: Self-pay | Admitting: Physician Assistant

## 2021-11-29 DIAGNOSIS — N179 Acute kidney failure, unspecified: Secondary | ICD-10-CM

## 2021-11-29 DIAGNOSIS — E876 Hypokalemia: Secondary | ICD-10-CM

## 2021-11-29 LAB — BASIC METABOLIC PANEL
BUN/Creatinine Ratio: 24 (ref 12–28)
BUN: 37 mg/dL — ABNORMAL HIGH (ref 8–27)
CO2: 33 mmol/L — ABNORMAL HIGH (ref 20–29)
Calcium: 9 mg/dL (ref 8.7–10.3)
Chloride: 85 mmol/L — ABNORMAL LOW (ref 96–106)
Creatinine, Ser: 1.52 mg/dL — ABNORMAL HIGH (ref 0.57–1.00)
Glucose: 169 mg/dL — ABNORMAL HIGH (ref 70–99)
Potassium: 3 mmol/L — ABNORMAL LOW (ref 3.5–5.2)
Sodium: 133 mmol/L — ABNORMAL LOW (ref 134–144)
eGFR: 38 mL/min/{1.73_m2} — ABNORMAL LOW (ref >59)

## 2021-11-29 LAB — MAGNESIUM: Magnesium: 2 mg/dL (ref 1.6–2.3)

## 2021-11-29 NOTE — Telephone Encounter (Signed)
64 year old female with HFpEF.  She was recently seen in the office with volume excess. She was placed on metolazone 5 mg daily for 3 days.  Of note, she takes metolazone 5 mg every Thursday.  Lab Results  Component Value Date   NA 133 (L) 11/28/2021   K 3.0 (L) 11/28/2021   CO2 33 (H) 11/28/2021   GLUCOSE 169 (H) 11/28/2021   BUN 37 (H) 11/28/2021   CREATININE 1.52 (H) 11/28/2021   CALCIUM 9.0 11/28/2021   EGFR 38 (L) 11/28/2021   GFRNONAA 52 (L) 10/16/2021    The patient called the answering service concerned about her labs, noted on my chart.  Her sodium is low, potassium low, BUN and creatinine increased.  Lab values consistent with overdiuresis.  The patient notes occasional palpitations described as a skipping sensation.  She denies syncope.  Her breathing is stable.  Her lower extremity edema is improved.  Her weight is down about 10 pounds at home.  PLAN: Hold torsemide tonight. Take torsemide 40 mg in the morning, tomorrow. Tomorrow night, resume torsemide 80 mg twice daily. Continue potassium 20 mEq twice daily. Take extra potassium 40 mEq x 1 now.  Repeat in 3 hours. Patient will need a BMET Monday morning, 12/01/2021. Continue metolazone 5 mg every Thursday. Richardson Dopp, PA-C    11/29/2021 2:07 PM

## 2021-12-01 ENCOUNTER — Other Ambulatory Visit: Payer: Self-pay

## 2021-12-01 DIAGNOSIS — N179 Acute kidney failure, unspecified: Secondary | ICD-10-CM

## 2021-12-01 DIAGNOSIS — E876 Hypokalemia: Secondary | ICD-10-CM | POA: Diagnosis not present

## 2021-12-01 NOTE — Telephone Encounter (Addendum)
LCSW received message from [place], [place], regarding pt needing labs today. Pt utilizes door to door transportation services through [facility], and rides through her insurance provider. These generally require 24-48 hr notice for pt to get to our facilities. LCSW called and spoke with Transportation Services who were able to fit pt in for a ride at about 2:45pm today to get her to labs at 3pm at [facility]. Pt was called and made aware of pick up time and is aware to first call Transportation Services directly, if any challenges, then this Probation officer. Sharyn Lull, LPN, confirmed pt can come this afternoon for labs rather than this morning and Maria [name] aware of arrangements.   Westley Hummer, MSW, [place]  [phone]- work cell phone (preferred) [phone]- desk phone

## 2021-12-02 DIAGNOSIS — E1151 Type 2 diabetes mellitus with diabetic peripheral angiopathy without gangrene: Secondary | ICD-10-CM | POA: Diagnosis not present

## 2021-12-02 DIAGNOSIS — I5033 Acute on chronic diastolic (congestive) heart failure: Secondary | ICD-10-CM | POA: Diagnosis not present

## 2021-12-02 DIAGNOSIS — I872 Venous insufficiency (chronic) (peripheral): Secondary | ICD-10-CM | POA: Diagnosis not present

## 2021-12-02 DIAGNOSIS — I890 Lymphedema, not elsewhere classified: Secondary | ICD-10-CM | POA: Diagnosis not present

## 2021-12-02 DIAGNOSIS — I110 Hypertensive heart disease with heart failure: Secondary | ICD-10-CM | POA: Diagnosis not present

## 2021-12-02 LAB — BASIC METABOLIC PANEL
BUN/Creatinine Ratio: 19 (ref 12–28)
BUN: 22 mg/dL (ref 8–27)
CO2: 33 mmol/L — ABNORMAL HIGH (ref 20–29)
Calcium: 8.7 mg/dL (ref 8.7–10.3)
Chloride: 90 mmol/L — ABNORMAL LOW (ref 96–106)
Creatinine, Ser: 1.16 mg/dL — ABNORMAL HIGH (ref 0.57–1.00)
Glucose: 119 mg/dL — ABNORMAL HIGH (ref 70–99)
Potassium: 3.6 mmol/L (ref 3.5–5.2)
Sodium: 138 mmol/L (ref 134–144)
eGFR: 53 mL/min/{1.73_m2} — ABNORMAL LOW (ref >59)

## 2021-12-05 ENCOUNTER — Ambulatory Visit: Payer: Medicare Other

## 2021-12-05 ENCOUNTER — Other Ambulatory Visit: Payer: Self-pay

## 2021-12-05 DIAGNOSIS — M25551 Pain in right hip: Secondary | ICD-10-CM | POA: Diagnosis not present

## 2021-12-05 DIAGNOSIS — E1169 Type 2 diabetes mellitus with other specified complication: Secondary | ICD-10-CM

## 2021-12-05 DIAGNOSIS — M545 Low back pain, unspecified: Secondary | ICD-10-CM | POA: Diagnosis not present

## 2021-12-05 DIAGNOSIS — M25512 Pain in left shoulder: Secondary | ICD-10-CM | POA: Diagnosis not present

## 2021-12-05 DIAGNOSIS — M25561 Pain in right knee: Secondary | ICD-10-CM | POA: Diagnosis not present

## 2021-12-05 DIAGNOSIS — E669 Obesity, unspecified: Secondary | ICD-10-CM

## 2021-12-05 DIAGNOSIS — N1831 Chronic kidney disease, stage 3a: Secondary | ICD-10-CM

## 2021-12-05 DIAGNOSIS — M1711 Unilateral primary osteoarthritis, right knee: Secondary | ICD-10-CM | POA: Diagnosis not present

## 2021-12-05 DIAGNOSIS — I5030 Unspecified diastolic (congestive) heart failure: Secondary | ICD-10-CM

## 2021-12-05 DIAGNOSIS — J321 Chronic frontal sinusitis: Secondary | ICD-10-CM | POA: Diagnosis not present

## 2021-12-05 DIAGNOSIS — G894 Chronic pain syndrome: Secondary | ICD-10-CM | POA: Diagnosis not present

## 2021-12-05 DIAGNOSIS — Z79891 Long term (current) use of opiate analgesic: Secondary | ICD-10-CM | POA: Diagnosis not present

## 2021-12-05 DIAGNOSIS — M25562 Pain in left knee: Secondary | ICD-10-CM | POA: Diagnosis not present

## 2021-12-05 DIAGNOSIS — M25552 Pain in left hip: Secondary | ICD-10-CM | POA: Diagnosis not present

## 2021-12-05 DIAGNOSIS — M79661 Pain in right lower leg: Secondary | ICD-10-CM | POA: Diagnosis not present

## 2021-12-05 NOTE — Progress Notes (Signed)
Chronic Care Management Pharmacy Note  12/05/2021 Name:  Maria [name] MRN:  [id number] DOB:  May 05, 1956  Summary: - Patient reports that she recently has stopped losartan due to hypotension (blood pressures averaging 110/40's) - since stopping reports that blood pressure has been averaging 120/60's with a HR in the 60's - Patient reports that she has been monitoring her weights, most recently has been averaging ~367 - dry weight noted to be 358lbs -Patient reports that for the most part she is compliant with her diuretic dosing, if she has an appointment that day she will only take 59m of torsemide in the AM, and 859min the PM - due to issues with incontinence if she takes the full dose  -Continues with low sodium diet, also does not use NSAIDs -Notes that BP this AM was 104/42 - reports that typically BP is higher - denies any symptoms of hypotension at this time  -reports to cramping / shaking in her hands at times - has been progressively worsening recently  -Has stopped taking metformin as she has been monitoring BG  and has been controlled averaging 94-120 without metformin  Recommendations/Changes made from today's visit: -stressed to patient importance of adherence with diuretics - reviewed with patient that dry weight is around 358lbs so she is currently up about 10 lbs from her dry weight - Advised to reach out to cardiology regarding diuretic administration - currently taking metolazone once weekly along with torsemide 8016mID  -Patient to continue to monitor weight, blood pressure, and blood sugar once daily, patient to make office aware should any of these be uncontrolled   Subjective: Maria [name] is an [age] year old female who is a primary patient of Maria [name].  The CCM team was consulted for assistance with disease management and care coordination needs.    Engaged with patient by telephone for return visit in response to provider referral for pharmacy case  management and/or care coordination services.   Consent to Services:  The patient was given the following information about Chronic Care Management services today, agreed to services, and gave verbal consent: 1. CCM service includes personalized support from designated clinical staff supervised by the primary care provider, including individualized plan of care and coordination with other care providers 2. 24/7 contact phone numbers for assistance for urgent and routine care needs. 3. Service will only be billed when office clinical staff spend 20 minutes or more in a month to coordinate care. 4. Only one practitioner may furnish and bill the service in a calendar month. 5.The patient may stop CCM services at any time (effective at the end of the month) by phone call to the office staff. 6. The patient will be responsible for cost sharing (co-pay) of up to 20% of the service fee (after annual deductible is met). Patient agreed to services and consent obtained.  Patient Care Team: JohBiagio BorgD as PCP - General (Internal Medicine) HocMinus BreedingD as PCP - Cardiology (Cardiology) MarHelayne SeminoleD as Consulting Physician (Otolaryngology) ShaArtis DelayD as Referring Physician (Internal Medicine) TouKnox RoyaltyN as Case Manager  Recent office visits:  10/29/2021 - Dr. JohJenny Reichmannf/u for persistent swelling to knees and legs - referred to nephrology - no changes to medications f/u in 3 months    Recent consult visits:  11/28/2021 - Dr. HocPercival SpanishCardiology - palpitations - continue to monitor - no changes to medications  11/20/2021 - Dr. HocPercival Spanish  Cardiology - increased edema - weight up ~1lbs - start metolazone 70m daily, increase potassium to 46m BID x 3 days, then resume previous dose  10/09/2021 - Dr. HoStarleen Blue Cardiology - no changes to medications - continue to monitor weights - f/u in 4 months 10/06/2021 - Dr. RePaulla DollyPodiatry - evaluation of left ankle injury, no changes to  medications   09/19/2021 - SaJeralyn RuthsP  - visit for evaluation of leg swelling (x 10 days) - gain on 18lbs over last 10 days - recommended to increase torsemide to 8029mID - ED visit  09/09/2021 - HaoAlmyra Deforest - cardiology - increase torsemide to 75m64mD, losartan decreased to 50mg75mly   Recent Hospital Visits 10/17/2021 - To ER for abdominal pain - symptoms resolved with ED meds - no changes to medications on discharge  [facility] - fluid overload - unable to ambulate - admitted for CHF - started on IV lasix diuresed 13L - discharged on torsemide 80mg 46m 08/10/2021 - ED visit due to peripheral edema / SOB - discharged on cephalexin for cystitis   Objective:  Lab Results  Component Value Date   CREATININE 1.16 (H) 12/01/2021   BUN 22 12/01/2021   GFR 53.82 (L) 07/07/2021   GFRNONAA 52 (L) 10/16/2021   GFRAA >60 08/06/2020   NA 138 12/01/2021   K 3.6 12/01/2021   CALCIUM 8.7 12/01/2021   CO2 33 (H) 12/01/2021   GLUCOSE 119 (H) 12/01/2021    Lab Results  Component Value Date/Time   HGBA1C 6.1 (H) 09/20/2021 05:18 AM   HGBA1C 6.1 07/07/2021 03:10 PM   GFR 53.82 (L) 07/07/2021 03:10 PM   GFR 50.54 (L) 04/18/2021 12:18 PM    Last diabetic Eye exam:  Lab Results  Component Value Date/Time   HMDIABEYEEXA No Retinopathy 05/14/2021 11:13 AM    Last diabetic Foot exam:  No results found for: HMDIABFOOTEX   Lab Results  Component Value Date   CHOL 147 07/07/2021   HDL 43.30 07/07/2021   LDLCALC 76 07/07/2021   LDLDIRECT 127.0 01/25/2019   TRIG 140.0 07/07/2021   CHOLHDL 3 07/07/2021    Hepatic Function Latest Ref Rng & Units 10/16/2021 09/20/2021 09/19/2021  Total Protein 6.5 - 8.1 g/dL 7.1 7.0 7.3  Albumin 3.5 - 5.0 g/dL 4.0 3.7 3.8  AST 15 - 41 U/L 10(L) 17 19  ALT 0 - 44 U/L _0 Alk Phosphatase 38 - 126 U/L 75 62 69  Total Bilirubin 0.3 - 1.2 mg/dL 0.6 1.2 1.1  Bilirubin, Direct 0.0 - 0.3 mg/dL - - -    Lab Results   Component Value Date/Time   TSH 1.45 12/31/2020 02:04 PM   TSH 1.08 01/31/2020 04:39 PM    CBC Latest Ref Rng & Units 10/16/2021 09/20/2021 09/19/2021  WBC 4.0 - 10.5 K/uL 11.5(H) 6.7 8.0  Hemoglobin 12.0 - 15.0 g/dL 12.3 12.0 11.6(L)  Hematocrit 36.0 - 46.0 % 38.5 37.0 36.1  Platelets 150 - 400 K/uL 288 228 250    Lab Results  Component Value Date/Time   VD25OH 26.86 (L) 07/07/2021 03:10 PM   VD25OH 24.56 (L) 12/31/2020 02:04 PM    Clinical ASCVD: No  The 10-year ASCVD risk score (Arnett DK, et al., 2019) is: 12.2%   Values used to calculate the score:     Age: 9 yea33     Sex: Female     Is Non-Hispanic African American: No  Diabetic: Yes     Tobacco smoker: No     Systolic Blood Pressure: 967 mmHg     Is BP treated: Yes     HDL Cholesterol: 43.3 mg/dL     Total Cholesterol: 147 mg/dL    Depression screen [facility] 2/9 04/25/2021 12/31/2020 09/20/2020  Decreased Interest 0 0 0  Down, Depressed, Hopeless 0 0 0  PHQ - 2 Score 0 0 0  Altered sleeping - - 0  Tired, decreased energy - - 0  Change in appetite - - 0  Feeling bad or failure about yourself  - - 0  Trouble concentrating - - 0  Moving slowly or fidgety/restless - - 0  Suicidal thoughts - - 0  PHQ-9 Score - - 0  Difficult doing work/chores - - -  Some recent data might be hidden     Social History   Tobacco Use  Smoking Status Former   Years: 30.00   Types: Cigarettes   Quit date: 11/08/2008   Years since quitting: 13.0  Smokeless Tobacco Never  Tobacco Comments   quit 10/09   BP Readings from Last 3 Encounters:  11/20/21 (!) 130/58  10/29/21 114/60  10/17/21 (!) 142/47   Pulse Readings from Last 3 Encounters:  11/20/21 84  10/29/21 67  10/17/21 79   Wt Readings from Last 3 Encounters:  11/28/21 (!) 363 lb 6.4 oz (164.8 kg)  11/20/21 (!) 371 lb 9.6 oz (168.6 kg)  10/29/21 (!) 369 lb (167.4 kg)   BMI Readings from Last 3 Encounters:  11/28/21 60.47 kg/m  11/20/21 61.84 kg/m  10/29/21  61.40 kg/m    Assessment/Interventions: Review of patient past medical history, allergies, medications, health status, including review of consultants reports, laboratory and other test data, was performed as part of comprehensive evaluation and provision of chronic care management services.   SDOH:  (Social Determinants of Health) assessments and interventions performed: Yes  SDOH Screenings   Alcohol Screen: Not on file  Depression (PHQ2-9): Low Risk    PHQ-2 Score: 0  Financial Resource Strain: Not on file  Food Insecurity: No Food Insecurity   Worried About Charity fundraiser in the Last Year: Never true   Ran Out of Food in the Last Year: Never true  Housing: Not on file  Physical Activity: Not on file  Social Connections: Not on file  Stress: Not on file  Tobacco Use: Medium Risk   Smoking Tobacco Use: Former   Smokeless Tobacco Use: Never   Passive Exposure: Not on file  Transportation Needs: No Transportation Needs   Lack of Transportation (Medical): No   Lack of Transportation (Non-Medical): No    CCM Care Plan  Allergies  Allergen Reactions   Amoxicillin-Pot Clavulanate Nausea And Vomiting    Projectile vomiting Did it involve swelling of the face/tongue/throat, SOB, or low BP? No Did it involve sudden or severe rash/hives, skin peeling, or any reaction on the inside of your mouth or nose? No Did you need to seek medical attention at a hospital or doctor's office? No When did it last happen?      20-30 years If all above answers are NO, may proceed with cephalosporin use.    Adhesive [Tape] Other (See Comments)    Tears skin off - use paper tape    Codeine Other (See Comments)    hallucinations   Crestor [Rosuvastatin Calcium]     Severe muscle cramps   Morphine And Related Nausea And Vomiting and Other (See  Comments)    Migraine headaches   Vicodin [Hydrocodone-Acetaminophen] Other (See Comments)    hallucinations   Latex Rash    Medications  Reviewed Today     Reviewed by [name] Royalty, RN (Registered Nurse) on 11/24/21 at [place] List Status: <None>   Medication Order Taking? Sig Documenting Provider Last Dose Status Informant  Accu-Chek Softclix Lancets lancets [id number] No Use as directed up to 4 times daily  Patient taking differently: 1 each by Other route in the morning, at noon, in the evening, and at bedtime.   Elodia Florence., MD Taking Active   acetaminophen (TYLENOL) 325 MG tablet [id number] No Take 650 mg by mouth every 6 (six) hours as needed for headache or fever (pain). [provider] Taking Active Self  albuterol (PROAIR HFA) 108 (90 Base) MCG/ACT inhaler [id number] No Inhale 2 puffs into the lungs every 6 (six) hours as needed for wheezing or shortness of breath. Lucretia Kern, DO Taking Active Self           Med Note [facility] [facility] May 01, 2021  2:27 PM)    Blood Glucose Monitoring Suppl (ACCU-CHEK GUIDE) w/Device KIT [id number] No Use as directed  Patient taking differently: 1 each by Other route as directed.   Elodia Florence., MD Taking Active   Cholecalciferol (VITAMIN D3) 50 MCG (2000 UT) CAPS [id number] No Take 2 capsules by mouth daily. [provider] Taking Active Self  clonazePAM (KLONOPIN) 1 MG tablet [id number] No TAKE 1 TABLET BY MOUTH 2 TIMES A DAY AS NEEDED FOR ANXIETY  Patient taking differently: Take 1 mg by mouth 2 (two) times daily as needed for anxiety.   Biagio Borg, MD Taking Active            Med Note [place] [facility] May 01, 2021  2:28 PM)    cyclobenzaprine (FLEXERIL) 10 MG tablet [id number] No TAKE 1 TABLET BY MOUTH 2 TIMES A DAY AS NEEDED FOR MUSCLE SPASMS  Patient taking differently: Take 10 mg by mouth 2 (two) times daily as needed for muscle spasms.   Biagio Borg, MD Taking Active            Med Note [facility] May 01, 2021  2:28 PM)    Discontinued 03/09/12 1633   Fexofenadine HCl (ALLEGRA PO)  [id number] No Take 180 mg by mouth daily as needed (allergies). [provider] Taking Active Self  glucose blood (ACCU-CHEK GUIDE) test strip [id number] No Use as instructed up to 4 times daily  Patient taking differently: Use as instructed up to 4 times daily   Elodia Florence., MD Taking Active   losartan (COZAAR) 50 MG tablet [id number] No Take 1 tablet (50 mg total) by mouth daily. Almyra Deforest, [place] Taking Active Self  Metamucil Fiber CHEW [id number] No Chew 3 tablets by mouth at bedtime. [provider] Taking Active Self  metFORMIN (GLUCOPHAGE) 500 MG tablet [id number] No Take 1 tablet (500 mg total) by mouth 2 (two) times daily with a meal.  Patient not taking: Reported on 11/20/2021   Biagio Borg, MD Not Taking Active   metolazone (ZAROXOLYN) 5 MG tablet [id number]  Take 1 tablet (5 mg total) by mouth daily. For 3 days Minus Breeding, MD  Active   metoprolol tartrate (LOPRESSOR) 25 MG tablet [id number] No Take 25 mg by mouth 2 (two) times daily. [provider]  Taking Active Self  NARCAN 4 MG/0.1ML LIQD nasal spray kit [id number] No Place 0.4 mg into the nose daily as needed (opioid overdose). [provider] Taking Active Self           Med Note [facility] [facility] May 01, 2021  2:31 PM)    ondansetron (ZOFRAN ODT) 4 MG disintegrating tablet [id number] No Take 1 tablet (4 mg total) by mouth every 4 (four) hours as needed for nausea or vomiting. Charlesetta Shanks, MD Taking Active            Med Note [facility] [facility] May 01, 2021  2:31 PM)    OVER THE COUNTER MEDICATION [id number] No Fiber Gummies. Patient takes 3 gummies at bedtime. [provider] Taking Active   Oxycodone HCl 10 MG TABS [id number] No Take 1 tablet (10 mg total) 4 (four) times daily by mouth. Per [facility]  Patient taking differently: Take 10 mg by mouth every 4 (four) hours as needed (pain). Per [facility]   Biagio Borg, MD Taking Active  Self           Med Note [facility], [facility]   Wed Aug 23, 2019  4:15 PM)    pantoprazole (PROTONIX) 40 MG tablet [id number] No TAKE ONE TABLET BY MOUTH DAILY  Patient taking differently: Take 40 mg by mouth daily.   Biagio Borg, MD Taking Active   Polyethyl Glycol-Propyl Glycol 0.4-0.3 % SOLN [id number] No Apply 1 drop to eye daily as needed (dry eyes). [provider] Taking Active Self  polyethylene glycol powder (GLYCOLAX/MIRALAX) 17 GM/SCOOP powder [id number] No Take 17 g by mouth daily as needed for mild constipation. Elodia Florence., MD Taking Active            Med Note [facility], [place] May 01, 2021  2:32 PM)    potassium chloride SA (KLOR-CON) 20 MEQ tablet [id number] No Take 1 tablet (20 mEq total) by mouth 2 (two) times daily. Minus Breeding, MD Taking Expired 11/20/21 2359   rOPINIRole (REQUIP) 1 MG tablet [id number] No Take 1 tablet (1 mg total) by mouth 3 (three) times daily. Biagio Borg, MD Taking Active Self  Simethicone (GAS-X EXTRA STRENGTH PO) [id number] No Take 1 tablet by mouth daily as needed (gas). [provider] Taking Active Self           Med Note [place] [facility] May 01, 2021  2:29 PM)    torsemide (DEMADEX) 20 MG tablet [id number]  Take 2 tablets (40 mg total) by mouth 2 (two) times daily. Minus Breeding, MD  Active             Patient Active Problem List   Diagnosis Date Noted   Leg swelling 11/20/2021   Bilateral knee pain 10/29/2021   Hypernatremia 09/24/2021   CKD (chronic kidney disease), stage IIIa ([place]) 09/24/2021   Sacral decubitus ulcer, stage II ([place]) 09/20/2021   Acute on chronic diastolic CHF (congestive heart failure) ([place]) 09/19/2021   Nail disorder 07/07/2021   Pre-ulcerative corn or callous 07/07/2021   Rash 92/95/7473   Diastolic congestive heart failure ([place]) 03/17/2021   Diabetes mellitus type 2 in obese ([place]) 03/11/2021   Vitamin D deficiency 01/01/2021   Medial epicondylitis  12/31/2020   Aortic atherosclerosis ([place]) 40/37/0964   Umbilical hernia 38/38/1840   Constipation 08/05/2020   Arthritis 06/12/2020   Hoarseness 05/01/2020  Vitamin B 12 deficiency 01/31/2020   Rosacea 01/31/2020   Neck swelling 01/31/2020   History of colonic polyps    Benign neoplasm of [place]    Nausea 08/31/2019   Throat pain 07/26/2019   HLD (hyperlipidemia) 05/24/2019   Leg cramps 05/24/2019   Lump in neck 05/24/2019   Left thyroid nodule 01/25/2019   Jerking movements of extremities 07/20/2018   Balance disorder 07/20/2018   Right knee pain 03/21/2018   OSA (obstructive sleep apnea) 03/21/2018   Oxygen desaturation 03/21/2018   Urinary symptom or sign 03/21/2018   Acquired lymphedema 01/25/2018   Gait disorder 01/25/2018   Peripheral edema 01/16/2018   SOB (shortness of breath) 01/15/2018   Dysphonia 12/07/2017   Encounter for well adult exam with abnormal findings 11/08/2017   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   [place]th[place] polyp 08/11/2016   Abdominal pain, epigastric    Dysphagia    [place] cancer screening    ACE-inhibitor cough 12/05/2015   Restless legs syndrome 10/08/2015   Venous stasis dermatitis of both lower extremities 04/26/2015   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/17/2014   AR (allergic rhinitis) 10/17/2014   Fatty liver 10/17/2014   Chronic pain syndrome 09/19/2014   Post-traumatic osteoarthritis of both knees 09/19/2014   Spinal stenosis 09/19/2014   Depression, major, single episode, complete remission ([place]) 09/19/2014   Narcotic dependence ([place]) 09/19/2014   Spondylosis of 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 03/09/2012   Lumbar disc disease 03/09/2012   Chronic pain 03/09/2012   Morbid obesity ([place]) 06/17/2011   Chronic low back pain 12/28/2008   Anxiety state 12/06/2007   Depression 12/06/2007   Essential hypertension 12/06/2007   Gastroesophageal reflux disease without esophagitis 12/06/2007   LOW BACK PAIN 12/06/2007     Immunization History  Administered Date(s) Administered   Hepatitis B, ped/adol 06/03/2016, 07/17/2016   Influenza Split 10/13/2012   Influenza, Seasonal, Injecte, Preservative Fre 09/19/2014, 10/08/2015, 09/11/2016   Influenza,inj,Quad PF,6+ Mos 08/31/2013, 11/08/2017, 12/30/2018, 12/31/2020, 10/29/2021   Moderna SARS-COV2 Booster Vaccination 10/19/2020   Moderna Sars-Covid-2 Vaccination 09/21/2020   Pneumococcal Polysaccharide-23 08/18/2013, 10/29/2021   Td 12/21/2004   Tdap 05/01/2015, 11/08/2017    Conditions to be addressed/monitored:  Hyperlipidemia, Diabetes, Heart Failure, GERD, Anxiety, Allergic Rhinitis, Chronic Pain, Constipation, Allergic Rhinitis, and Restless Leg Syndrome   Care Plan : CCM Care Plan  Updates made by Tomasa Blase, RPH since 12/05/2021 12:00 AM     Problem: HTN, HF, HLD, DM2, RLS, Chronic Pain, Allergic Rhinitis, GERD, Constipation, Anxiety, Vitamin D Deficiency   Priority: High  Onset Date: 08/01/2021     Long-Range Goal: Disease Management   Start Date: 08/01/2021  Expected End Date: 02/01/2022  This Visit's Progress: Not on track  Recent Progress: On track  Priority: High  Note:   Current Barriers:  Unable to independently monitor therapeutic efficacy  Pharmacist Clinical Goal(s):  Patient will achieve adherence to monitoring guidelines and medication adherence to achieve therapeutic efficacy Optimize safety of medications by limiting interactions of benzodiazepine, opioids, and muscle relaxer, but limiting amount of each that is taken through collaboration with PharmD and provider.   Interventions: 1:1 collaboration with Biagio Borg, MD regarding development and update of comprehensive plan of care as evidenced by provider attestation and co-signature Inter-disciplinary care team collaboration (see longitudinal plan of care) Comprehensive medication review performed; medication list updated in electronic medical record  Diabetes  (A1c goal <7%) -Controlled Lab Results  Component Value Date   HGBA1C 6.1 07/07/2021  -Current medications: Metformin  547m - 1 tablet twice daily - not taking at this time  Ozempic 180m- inject 15m79meekly - stopped due to side effects  -Medications previously tried: n/a -Current home glucose readings fasting glucose: reports that blood sugars without medications are averaging 94-120  -Denies hypoglycemic/hyperglycemic symptoms -Current meal patterns:  breakfast: egg whites with toast, special k cereal, bage lunch: meals on wheels provides - has potatoes and rice once weekly   dinner: spaghetti with meat sauce / lean cuisine  snacks: pretzels (low sodium) drinks: water - may have 1 bottle of peach green tea daily  -Current exercise: limited, reports that it is hard for her to be active due to ankle pain -Educated on A1c and blood sugar goals; Complications of diabetes including kidney damage, retinal damage, and cardiovascular disease; Benefits of weight loss; Prevention and management of hypoglycemic episodes; Benefits of routine self-monitoring of blood sugar; -Counseled to check feet daily and get yearly eye exams -Counseled on diet and exercise extensively Recommended to with lifestyle changes to keep BG controlled   Heart Failure (Goal: manage symptoms and prevent exacerbations)/ Hypertension BP goal <140/90) -Controlled -Last ejection fraction: 60-65% (Date: 03/08/2021) -HF type: Diastolic -NYHA Class: III (marked limitation of activity) -Current treatment: Losartan 88m98m1 tablet daily   Metoprolol Tartrate 25mg96m tablet twice daily  Torsemide 40mg 815mtablets twice daily  Potassium Chloride 20mEq 18mtablet 2 times daily  Metolazone 2.5mg - 185mblet weekly  -Medications previously tried: furosemide, hctz,    -Current home BP/HR readings: this AM was 104/42 - typically  122/60 HR 60's - no symptoms of hypotension -Current dietary habits: reports to limited sodium  intake  -Current exercise habits: limited - exercise is difficult due to ankle pain -Educated on Benefits of medications for managing symptoms and prolonging life Importance of weighing daily; if you gain more than 3 pounds in one day or 5 pounds in one week, call the PCP office to discuss/ proceed to ER Proper diuretic administration and potassium supplementation Importance of blood pressure control -Counseled on diet and exercise extensively Recommended to continue current medication Stressed adherence to diuretic schedule - due to increase weight advised to reach out to cardiology office -continue to monitor weight, BP, and HR   Hyperlipidemia: (LDL goal < 70) -Not ideally controlled Lab Results  Component Value Date   LDLCALC 76 07/07/2021  -Current treatment: N/a -Medications previously tried: rosuvastatin   -Current dietary patterns: reports diet that is limited in high cholesterol foods -Current exercise habits: limited due to ankle pain -Educated on Cholesterol goals;  Benefits of statin for ASCVD risk reduction; Importance of limiting foods high in cholesterol; Strategies to manage statin-induced myalgias; -Recommended for patient to recheck vitamin D levels, once back in range, would recommend for patient to retrial statin medication, as patient is only slightly elevated from goal, would recommend trial of low intensity statin such as pravastatin 20mg dai43mo reduce chance of adverse effects   Anxiety (Goal: Prevention / control of anxiety attacks) -Controlled -Current treatment: Clonazepam 15mg - 1 t46met twice daily as needed  -Medications previously tried/failed: alprazolam, duloxetine, citalopram, sertraline,  -GAD7: unable to be completed today,  -Educated on Benefits of medication for symptom control Benefits of cognitive-behavioral therapy with or without medication -Recommended for patient to reduce clonazepam, reports that she is only taking 1 tablet daily if  needed, ideally would be able to replace benzodiazepine with a medication such as buspar which would be safer for  patient as she is using oxycodone and cyclobenzaprine for pain- patient agreeable to work with CCM team to reduce use of clonazepam and possibly trial buspar in the future  -Will reduce clonazepam to 1/2 tablet daily if needed, asked to be referred for another psych counselor   Chronic Pain - OA of knees, lumbar disc disease (Goal: Pain control) -Controlled -Current treatment  Oxycodone 24m - 1 tablet every 4 hours if needed (no more than 5 times daily) - follows with Dr. HLennox Grumblesfor pain management  Cyclobenzaprine 134m- 1 tablet twice daily as needed  - might take twice weekly  Acetaminophen 32556m 2 tablets every 6 hours as needed  Narcan 4mg47m1mL - as needed (opioid overdose) -Medications previously tried: percocet, diclofenac, ibuprofen, meloxicam, naproxen, tramadol, tizanidine  -Counseled on safety concern with contaminant use of oxycodone, cyclobenzaprine, and clonazepam, patient reports that she is aware of how to use narcan if needed, advised for patient to ensure those that are around her are also aware of how to administer in case of emergency   Constipation (Goal: promotion of regular bowel habbits) -Controlled -Current treatment  Miralax - 17g daily as needed  Simethicone extra strength - 1 tablet daily as needed  Fiber Gummies - 3 gummies daily  -Medications previously tried: n/a  -Recommended to continue current medication  Vitamin D Deficiency (Goal: Maintenance of proper vitamin D levels) -Not ideally controlled - vitamin D level has not been rechecked since increasing dose  Last vitamin D Last vitamin D Lab Results  Component Value Date   VD25OH 26.86 (L) 07/07/2021  -Current treatment  Vitamin D 2000 units - 2 tablet daily  -Medications previously tried: n/a  -Recommended to continue current medication  Restless Leg Syndrom (Goal: Prevention of  abnormal movement of legs) -Controlled -Current treatment  Ropinirole 1mg 38m tablet 3 times daily  -Medications previously tried: gabapentin -Recommended to continue current medication  Allergic Rhinitis (Goal: control/ prevention of allergies) -Controlled -Current treatment  Fexofenadine 180mg 59mtablet daily if needed  Albuterol 108mcg/5mHFA - 2 puffs every 6 hours as needed  - rarely uses  -Medications previously tried: mometasone nasal spray  -Recommended to continue current medication  GERD (Goal: control / prevention of acid reflux) -Controlled -Current treatment  Pantoprazole 40mg - 75mblet daily  Ondansetron 4mg- 1 t34met every 4 hours as needed for nausea   -Medications previously tried: ranitidine,   -Recommended to continue current medication   Health Maintenance -Vaccine gaps: COVID booster, flu vaccine, shingles vaccines -Current therapy:  Systane eye drops - 1 drop into both eyes daily as needed  -Counseled on diet and exercise extensively  Patient Goals/Self-Care Activities Patient will:  - take medications as prescribed check glucose daily, document, and provide at future appointments check blood pressure daily, document, and provide at future appointments weigh daily, and contact provider if weight gain of >3 lbs in a day or >5lbs in a week engage in dietary modifications by continuing with reduced sodium intake, and lowering intake of foods high in cholesterol  Follow Up Plan: Telephone follow up appointment with care management team member scheduled for: 6 weeks  The patient has been provided with contact information for the care management team and has been advised to call with any health related questions or concerns.         Medication Assistance: None required.  Patient affirms current coverage meets needs.   Patient's preferred pharmacy is:  [id number] -  [place], [place] [address] Phone: [phone] Fax: [phone]  [facility] [phone] [place], [place] - [place] [address] Phone: [phone] Fax: [place], [place] [address] [place] [address] Phone: [phone] Fax: [phone]  Uses pill box? Yes Pt endorses 95-100% compliance  Care Plan and Follow Up Patient Decision:  Patient agrees to Care Plan and Follow-up.  Plan: Telephone follow up appointment with care management team member scheduled for:  3 months  and The patient has been provided with contact information for the care management team and has been advised to call with any health related questions or concerns.   Tomasa Blase, PharmD Clinical Pharmacist, [place]

## 2021-12-05 NOTE — Patient Instructions (Signed)
Visit Information  Following are the goals we discussed today:   Track and Manage Symptoms - HF   imeframe:  Long-Range Goal Priority:  High Start Date:   08/01/2021                          Expected End Date:  12/01/2022                     Follow Up Date 02/01/2022    - eat more whole grains, fruits and vegetables, lean meats and healthy fats - know when to call the doctor - track symptoms and what helps feel better or worse    Why is this important?   You will be able to handle your symptoms better if you keep track of them.  Making some simple changes to your lifestyle will help.  Eating healthy is one thing you can do to take good care of yourself.    Plan: Telephone follow up appointment with care management team member scheduled for:  6 weeks  The patient has been provided with contact information for the care management team and has been advised to call with any health related questions or concerns.   Tomasa Blase, PharmD Clinical Pharmacist, [facility]   Please call the care guide team at [phone] if you need to cancel or reschedule your appointment.   Patient verbalizes understanding of instructions provided today and agrees to view in [place].

## 2021-12-09 DIAGNOSIS — I872 Venous insufficiency (chronic) (peripheral): Secondary | ICD-10-CM | POA: Diagnosis not present

## 2021-12-09 DIAGNOSIS — I5033 Acute on chronic diastolic (congestive) heart failure: Secondary | ICD-10-CM | POA: Diagnosis not present

## 2021-12-09 DIAGNOSIS — E1151 Type 2 diabetes mellitus with diabetic peripheral angiopathy without gangrene: Secondary | ICD-10-CM | POA: Diagnosis not present

## 2021-12-09 DIAGNOSIS — I890 Lymphedema, not elsewhere classified: Secondary | ICD-10-CM | POA: Diagnosis not present

## 2021-12-09 DIAGNOSIS — I110 Hypertensive heart disease with heart failure: Secondary | ICD-10-CM | POA: Diagnosis not present

## 2021-12-12 ENCOUNTER — Telehealth: Payer: Self-pay | Admitting: Cardiology

## 2021-12-12 NOTE — Telephone Encounter (Signed)
° °  Pt c/o of Chest Pain: STAT if CP now or developed within 24 hours  1. Are you having CP right now? No   2. Are you experiencing any other symptoms (ex. SOB, nausea, vomiting, sweating)? None   3. How long have you been experiencing CP? Last night  4. Is your CP continuous or coming and going?   5. Have you taken Nitroglycerin?  Anna with [facility], she said they receive a triage call last night that pt had CP last night accompanying with constant coughing, pain does not radiate, pt's BP 138/65 with HR 81, pt been taking all of her meds and no change in her weight, she's been 367 lbs for the last 2 visits. She also mention pt is hard to go out right now for any doctor's office visit due to her limit mobility,but, [facility] does offer hoe blood work and mobile ekg if needed, she did tell pt, she might need to go to ED if needed. ?

## 2021-12-12 NOTE — Telephone Encounter (Signed)
Spoke with pt, she reports she started coughing hard last night. She was coughing through out the night and it woke her up. This morning the cough has settled down and now she is hurting in the center of her chest and to the left. The pain feels like heaviness and increases with palpitations. She reports her swelling and weight is stable and she usually only gets SOB with excess volume, she never coughs. Her bp is 131/61. Explained to patient that it sounds like muscle pain from coughing and she agreed. She doers not feel she is volume overloaded and does not need to take extra torsemide. Okay given for patient to take tylenol for the pain. Patient voiced understanding to call the office of her symptoms worsen or change. Anna at [facility] home [facility] aware of the above.

## 2021-12-16 ENCOUNTER — Other Ambulatory Visit: Payer: Self-pay | Admitting: Physician Assistant

## 2021-12-16 ENCOUNTER — Telehealth: Payer: Self-pay | Admitting: Physician Assistant

## 2021-12-16 ENCOUNTER — Telehealth: Payer: Self-pay | Admitting: Cardiology

## 2021-12-16 DIAGNOSIS — I890 Lymphedema, not elsewhere classified: Secondary | ICD-10-CM | POA: Diagnosis not present

## 2021-12-16 DIAGNOSIS — E1151 Type 2 diabetes mellitus with diabetic peripheral angiopathy without gangrene: Secondary | ICD-10-CM | POA: Diagnosis not present

## 2021-12-16 DIAGNOSIS — I872 Venous insufficiency (chronic) (peripheral): Secondary | ICD-10-CM | POA: Diagnosis not present

## 2021-12-16 DIAGNOSIS — I5033 Acute on chronic diastolic (congestive) heart failure: Secondary | ICD-10-CM | POA: Diagnosis not present

## 2021-12-16 DIAGNOSIS — I110 Hypertensive heart disease with heart failure: Secondary | ICD-10-CM | POA: Diagnosis not present

## 2021-12-16 MED ORDER — METOLAZONE 5 MG PO TABS: 5.0000 mg | ORAL_TABLET | Freq: Every day | ORAL | 0 refills | Status: DC

## 2021-12-16 MED ORDER — METOLAZONE 5 MG PO TABS: 5.0000 mg | ORAL_TABLET | ORAL | 0 refills | Status: DC | PRN

## 2021-12-16 NOTE — Telephone Encounter (Signed)
Pt c/o swelling: STAT is pt has developed SOB within 24 hours  If swelling, where is the swelling located? Both legs  How much weight have you gained and in what time span? 14 lbs  Have you gained 3 pounds in a day or 5 pounds in a week? Yes   Do you have a log of your daily weights (if so, list)? Yes   Are you currently taking a fluid pill? Yes   Are you currently SOB? No   Have you traveled recently? No

## 2021-12-16 NOTE — Telephone Encounter (Addendum)
Spoke with LeLe from [place].  She report pt continues to experience swelling in both legs and abdomen. She also report 6 lbs weight increase in 3 days.  12/24-367 12/25-370 12/26-373 Pt report SOB but state it's not new BP 100/56 HR 75 Lungs are clear per home health nurse  Pt report she's taking Torsemide 80 mg twice a day and Metolazone on Wednesday.  Per Dr. Harriet Masson (DOD), pt to take metolazone 5 mg tomorrow and Thursday, then schedule follow up appointment.  Appointment scheduled for 12/29 at 2 pm. Pt is already set up with [facility] and will call them to arrange ride.

## 2021-12-16 NOTE — Telephone Encounter (Signed)
Patient paged after our answering service to request a prescription for metolazone.  Based on today's office note, it appears patient contacted our office and I spoke with triage who checked with Dr. Harriet Masson.  Dr. Harriet Masson instructed the patient to take 5 mg of metolazone both this Wednesday and Thursday.  According to the patient, she she was previously taking torsemide 80 mg twice a day and with once weekly dosing of metolazone every Wednesday.  She says Dr. Percival Spanish instructed her to take the medication this way.  However I am unable to find any documentation to suggest disease.  Dr. Percival Spanish previously placed her on a short course of 3-day dosing of metolazone on 11/20/2021.  I am not sure when her torsemide was increased to 80 mg twice a day and when once weekly dosing metolazone was added to her regimen.  I have given her 3 tablets of metolazone so she has enough to take it for the next 2 days and as needed after that.  I also instructed her to take extra dose of potassium since she says she has been shaking pretty badly after taking metolazone.  I am concerned that we do not know her true volume level.  She likely will require a repeat basic metabolic panel.  And Dr. Percival Spanish, can you clarify the final dosing of torsemide and metolazone for her?  I am hesitant to give her any more metolazone other than those 3 tablets without knowing how much diuretic she should be on.

## 2021-12-18 ENCOUNTER — Other Ambulatory Visit: Payer: Self-pay

## 2021-12-18 ENCOUNTER — Emergency Department (HOSPITAL_COMMUNITY): Payer: Medicare Other

## 2021-12-18 ENCOUNTER — Inpatient Hospital Stay (HOSPITAL_COMMUNITY)
Admission: EM | Admit: 2021-12-18 | Discharge: 2021-12-21 | DRG: 291 | Disposition: A | Discharge status: Home Health | Payer: Medicare Other | Attending: Internal Medicine | Admitting: Internal Medicine

## 2021-12-18 ENCOUNTER — Encounter (HOSPITAL_COMMUNITY): Payer: Self-pay | Admitting: Emergency Medicine

## 2021-12-18 ENCOUNTER — Ambulatory Visit: Payer: Medicare Other | Admitting: Cardiology

## 2021-12-18 ENCOUNTER — Telehealth: Payer: Self-pay | Admitting: Licensed Clinical Social Worker

## 2021-12-18 DIAGNOSIS — Z91048 Other nonmedicinal substance allergy status: Secondary | ICD-10-CM

## 2021-12-18 DIAGNOSIS — Z20822 Contact with and (suspected) exposure to covid-19: Secondary | ICD-10-CM | POA: Diagnosis present

## 2021-12-18 DIAGNOSIS — Z8249 Family history of ischemic heart disease and other diseases of the circulatory system: Secondary | ICD-10-CM

## 2021-12-18 DIAGNOSIS — E1151 Type 2 diabetes mellitus with diabetic peripheral angiopathy without gangrene: Secondary | ICD-10-CM | POA: Diagnosis present

## 2021-12-18 DIAGNOSIS — R0600 Dyspnea, unspecified: Secondary | ICD-10-CM | POA: Diagnosis present

## 2021-12-18 DIAGNOSIS — Z7984 Long term (current) use of oral hypoglycemic drugs: Secondary | ICD-10-CM

## 2021-12-18 DIAGNOSIS — R5382 Chronic fatigue, unspecified: Secondary | ICD-10-CM | POA: Diagnosis not present

## 2021-12-18 DIAGNOSIS — R0602 Shortness of breath: Secondary | ICD-10-CM

## 2021-12-18 DIAGNOSIS — Z833 Family history of diabetes mellitus: Secondary | ICD-10-CM

## 2021-12-18 DIAGNOSIS — K219 Gastro-esophageal reflux disease without esophagitis: Secondary | ICD-10-CM | POA: Diagnosis present

## 2021-12-18 DIAGNOSIS — N1831 Chronic kidney disease, stage 3a: Secondary | ICD-10-CM | POA: Diagnosis present

## 2021-12-18 DIAGNOSIS — N179 Acute kidney failure, unspecified: Secondary | ICD-10-CM | POA: Diagnosis not present

## 2021-12-18 DIAGNOSIS — F32A Depression, unspecified: Secondary | ICD-10-CM | POA: Diagnosis present

## 2021-12-18 DIAGNOSIS — E669 Obesity, unspecified: Secondary | ICD-10-CM

## 2021-12-18 DIAGNOSIS — Z886 Allergy status to analgesic agent status: Secondary | ICD-10-CM

## 2021-12-18 DIAGNOSIS — G4733 Obstructive sleep apnea (adult) (pediatric): Secondary | ICD-10-CM | POA: Diagnosis present

## 2021-12-18 DIAGNOSIS — Z800 Family history of malignant neoplasm of digestive organs: Secondary | ICD-10-CM

## 2021-12-18 DIAGNOSIS — L89152 Pressure ulcer of sacral region, stage 2: Secondary | ICD-10-CM | POA: Diagnosis present

## 2021-12-18 DIAGNOSIS — G894 Chronic pain syndrome: Secondary | ICD-10-CM | POA: Diagnosis present

## 2021-12-18 DIAGNOSIS — I130 Hypertensive heart and chronic kidney disease with heart failure and stage 1 through stage 4 chronic kidney disease, or unspecified chronic kidney disease: Principal | ICD-10-CM | POA: Diagnosis present

## 2021-12-18 DIAGNOSIS — I890 Lymphedema, not elsewhere classified: Secondary | ICD-10-CM | POA: Diagnosis not present

## 2021-12-18 DIAGNOSIS — Z825 Family history of asthma and other chronic lower respiratory diseases: Secondary | ICD-10-CM

## 2021-12-18 DIAGNOSIS — K760 Fatty (change of) liver, not elsewhere classified: Secondary | ICD-10-CM | POA: Diagnosis present

## 2021-12-18 DIAGNOSIS — E876 Hypokalemia: Secondary | ICD-10-CM | POA: Diagnosis present

## 2021-12-18 DIAGNOSIS — I959 Hypotension, unspecified: Secondary | ICD-10-CM | POA: Diagnosis not present

## 2021-12-18 DIAGNOSIS — I5033 Acute on chronic diastolic (congestive) heart failure: Secondary | ICD-10-CM | POA: Diagnosis present

## 2021-12-18 DIAGNOSIS — Z888 Allergy status to other drugs, medicaments and biological substances status: Secondary | ICD-10-CM

## 2021-12-18 DIAGNOSIS — I10 Essential (primary) hypertension: Secondary | ICD-10-CM | POA: Diagnosis not present

## 2021-12-18 DIAGNOSIS — E1122 Type 2 diabetes mellitus with diabetic chronic kidney disease: Secondary | ICD-10-CM | POA: Diagnosis present

## 2021-12-18 DIAGNOSIS — I509 Heart failure, unspecified: Secondary | ICD-10-CM

## 2021-12-18 DIAGNOSIS — Z885 Allergy status to narcotic agent status: Secondary | ICD-10-CM

## 2021-12-18 DIAGNOSIS — G2581 Restless legs syndrome: Secondary | ICD-10-CM | POA: Diagnosis present

## 2021-12-18 DIAGNOSIS — E119 Type 2 diabetes mellitus without complications: Secondary | ICD-10-CM | POA: Diagnosis present

## 2021-12-18 DIAGNOSIS — Z91040 Latex allergy status: Secondary | ICD-10-CM

## 2021-12-18 DIAGNOSIS — Z880 Allergy status to penicillin: Secondary | ICD-10-CM

## 2021-12-18 DIAGNOSIS — R269 Unspecified abnormalities of gait and mobility: Secondary | ICD-10-CM | POA: Diagnosis present

## 2021-12-18 DIAGNOSIS — E1169 Type 2 diabetes mellitus with other specified complication: Secondary | ICD-10-CM | POA: Diagnosis present

## 2021-12-18 DIAGNOSIS — N183 Chronic kidney disease, stage 3 unspecified: Secondary | ICD-10-CM | POA: Diagnosis present

## 2021-12-18 DIAGNOSIS — E785 Hyperlipidemia, unspecified: Secondary | ICD-10-CM | POA: Diagnosis present

## 2021-12-18 DIAGNOSIS — I499 Cardiac arrhythmia, unspecified: Secondary | ICD-10-CM | POA: Diagnosis not present

## 2021-12-18 DIAGNOSIS — Z818 Family history of other mental and behavioral disorders: Secondary | ICD-10-CM

## 2021-12-18 DIAGNOSIS — F411 Generalized anxiety disorder: Secondary | ICD-10-CM | POA: Diagnosis present

## 2021-12-18 DIAGNOSIS — T502X5A Adverse effect of carbonic-anhydrase inhibitors, benzothiadiazides and other diuretics, initial encounter: Secondary | ICD-10-CM | POA: Diagnosis not present

## 2021-12-18 DIAGNOSIS — Z743 Need for continuous supervision: Secondary | ICD-10-CM | POA: Diagnosis not present

## 2021-12-18 DIAGNOSIS — Z6841 Body Mass Index (BMI) 40.0 and over, adult: Secondary | ICD-10-CM

## 2021-12-18 DIAGNOSIS — Z87891 Personal history of nicotine dependence: Secondary | ICD-10-CM

## 2021-12-18 DIAGNOSIS — I5031 Acute diastolic (congestive) heart failure: Secondary | ICD-10-CM | POA: Diagnosis not present

## 2021-12-18 DIAGNOSIS — I517 Cardiomegaly: Secondary | ICD-10-CM | POA: Diagnosis not present

## 2021-12-18 DIAGNOSIS — Z8614 Personal history of Methicillin resistant Staphylococcus aureus infection: Secondary | ICD-10-CM

## 2021-12-18 DIAGNOSIS — Z79899 Other long term (current) drug therapy: Secondary | ICD-10-CM

## 2021-12-18 DIAGNOSIS — R6889 Other general symptoms and signs: Secondary | ICD-10-CM | POA: Diagnosis not present

## 2021-12-18 LAB — RESP PANEL BY RT-PCR (FLU A&B, COVID) ARPGX2
Influenza A by PCR: NEGATIVE
Influenza B by PCR: NEGATIVE
SARS Coronavirus 2 by RT PCR: NEGATIVE

## 2021-12-18 LAB — BASIC METABOLIC PANEL
Anion gap: 9 (ref 5–15)
BUN: 28 mg/dL — ABNORMAL HIGH (ref 8–23)
CO2: 38 mmol/L — ABNORMAL HIGH (ref 22–32)
Calcium: 8.9 mg/dL (ref 8.9–10.3)
Chloride: 90 mmol/L — ABNORMAL LOW (ref 98–111)
Creatinine, Ser: 1.4 mg/dL — ABNORMAL HIGH (ref 0.44–1.00)
GFR, Estimated: 42 mL/min — ABNORMAL LOW (ref >60)
Glucose, Bld: 114 mg/dL — ABNORMAL HIGH (ref 70–99)
Potassium: 3.4 mmol/L — ABNORMAL LOW (ref 3.5–5.1)
Sodium: 137 mmol/L (ref 135–145)

## 2021-12-18 LAB — CBC
HCT: 35.8 % — ABNORMAL LOW (ref 36.0–46.0)
Hemoglobin: 12 g/dL (ref 12.0–15.0)
MCH: 30.2 pg (ref 26.0–34.0)
MCHC: 33.5 g/dL (ref 30.0–36.0)
MCV: 89.9 fL (ref 80.0–100.0)
Platelets: 301 10*3/uL (ref 150–400)
RBC: 3.98 MIL/uL (ref 3.87–5.11)
RDW: 13 % (ref 11.5–15.5)
WBC: 9.5 10*3/uL (ref 4.0–10.5)
nRBC: 0 % (ref 0.0–0.2)

## 2021-12-18 LAB — GLUCOSE, CAPILLARY: Glucose-Capillary: 120 mg/dL — ABNORMAL HIGH (ref 70–99)

## 2021-12-18 LAB — TROPONIN I (HIGH SENSITIVITY): Troponin I (High Sensitivity): 7 ng/L (ref <18)

## 2021-12-18 LAB — BRAIN NATRIURETIC PEPTIDE: B Natriuretic Peptide: 18.1 pg/mL (ref 0.0–100.0)

## 2021-12-18 MED ORDER — CLONAZEPAM 0.5 MG PO TABS
1.0000 mg | ORAL_TABLET | Freq: Two times a day (BID) | ORAL | Status: DC | PRN
  Administered 2021-12-19: 1 mg via ORAL
  Filled 2021-12-18: qty 2

## 2021-12-18 MED ORDER — ONDANSETRON 4 MG PO TBDP
4.0000 mg | ORAL_TABLET | ORAL | Status: DC | PRN
  Filled 2021-12-18: qty 1

## 2021-12-18 MED ORDER — LOSARTAN POTASSIUM 50 MG PO TABS
50.0000 mg | ORAL_TABLET | Freq: Every day | ORAL | Status: DC
  Filled 2021-12-18: qty 1

## 2021-12-18 MED ORDER — NALOXONE HCL 4 MG/0.1ML NA LIQD
0.4000 mg | Freq: Every day | NASAL | Status: DC | PRN
  Filled 2021-12-18: qty 8

## 2021-12-18 MED ORDER — FUROSEMIDE 10 MG/ML IJ SOLN
80.0000 mg | Freq: Two times a day (BID) | INTRAMUSCULAR | Status: DC
  Filled 2021-12-18: qty 8

## 2021-12-18 MED ORDER — POLYETHYL GLYCOL-PROPYL GLYCOL 0.4-0.3 % OP SOLN: 1.0000 [drp] | Freq: Every day | OPHTHALMIC | Status: DC | PRN

## 2021-12-18 MED ORDER — ENOXAPARIN SODIUM 40 MG/0.4ML IJ SOSY
40.0000 mg | PREFILLED_SYRINGE | INTRAMUSCULAR | Status: DC
  Administered 2021-12-19: 40 mg via SUBCUTANEOUS
  Filled 2021-12-18: qty 0.4

## 2021-12-18 MED ORDER — METOPROLOL TARTRATE 25 MG PO TABS
25.0000 mg | ORAL_TABLET | Freq: Two times a day (BID) | ORAL | Status: DC
  Administered 2021-12-19 – 2021-12-21 (×6): 25 mg via ORAL
  Filled 2021-12-18 (×6): qty 1

## 2021-12-18 MED ORDER — POTASSIUM CHLORIDE 20 MEQ PO PACK
40.0000 meq | PACK | Freq: Once | ORAL | Status: AC
  Administered 2021-12-19: 40 meq via ORAL
  Filled 2021-12-18: qty 2

## 2021-12-18 MED ORDER — PANTOPRAZOLE SODIUM 40 MG PO TBEC
40.0000 mg | DELAYED_RELEASE_TABLET | Freq: Every day | ORAL | Status: DC
  Administered 2021-12-19 – 2021-12-21 (×3): 40 mg via ORAL
  Filled 2021-12-18 (×3): qty 1

## 2021-12-18 MED ORDER — OXYCODONE HCL 5 MG PO TABS
10.0000 mg | ORAL_TABLET | Freq: Once | ORAL | Status: AC
  Administered 2021-12-18: 21:00:00 10 mg via ORAL
  Filled 2021-12-18: qty 2

## 2021-12-18 MED ORDER — SODIUM CHLORIDE 0.9% FLUSH
3.0000 mL | Freq: Two times a day (BID) | INTRAVENOUS | Status: DC
  Administered 2021-12-19 – 2021-12-20 (×5): 3 mL via INTRAVENOUS

## 2021-12-18 MED ORDER — POLYVINYL ALCOHOL 1.4 % OP SOLN: 1.0000 [drp] | OPHTHALMIC | Status: DC | PRN

## 2021-12-18 MED ORDER — SIMETHICONE 80 MG PO CHEW: 125.0000 mg | CHEWABLE_TABLET | Freq: Four times a day (QID) | ORAL | Status: DC | PRN

## 2021-12-18 MED ORDER — ACETAMINOPHEN 650 MG RE SUPP: 650.0000 mg | Freq: Four times a day (QID) | RECTAL | Status: DC | PRN

## 2021-12-18 MED ORDER — OXYCODONE HCL 5 MG PO TABS
10.0000 mg | ORAL_TABLET | ORAL | Status: DC | PRN
  Administered 2021-12-19 (×2): 10 mg via ORAL
  Filled 2021-12-18 (×2): qty 2

## 2021-12-18 MED ORDER — INSULIN ASPART 100 UNIT/ML IJ SOLN
0.0000 [IU] | Freq: Three times a day (TID) | INTRAMUSCULAR | Status: DC
  Administered 2021-12-19 (×2): 2 [IU] via SUBCUTANEOUS
  Administered 2021-12-19: 09:00:00 3 [IU] via SUBCUTANEOUS
  Administered 2021-12-20 (×2): 2 [IU] via SUBCUTANEOUS
  Administered 2021-12-20 – 2021-12-21 (×2): 3 [IU] via SUBCUTANEOUS

## 2021-12-18 MED ORDER — POLYETHYLENE GLYCOL 3350 17 G PO PACK
17.0000 g | PACK | Freq: Every day | ORAL | Status: DC | PRN
  Administered 2021-12-19: 17:00:00 17 g via ORAL

## 2021-12-18 MED ORDER — ACETAMINOPHEN 325 MG PO TABS: 650.0000 mg | ORAL_TABLET | Freq: Four times a day (QID) | ORAL | Status: DC | PRN

## 2021-12-18 MED ORDER — ROPINIROLE HCL 0.5 MG PO TABS
1.0000 mg | ORAL_TABLET | Freq: Three times a day (TID) | ORAL | Status: DC
  Administered 2021-12-19 – 2021-12-21 (×8): 1 mg via ORAL
  Filled 2021-12-18 (×8): qty 2

## 2021-12-18 MED ORDER — FUROSEMIDE 10 MG/ML IJ SOLN
80.0000 mg | Freq: Once | INTRAMUSCULAR | Status: AC
  Administered 2021-12-18: 21:00:00 80 mg via INTRAVENOUS
  Filled 2021-12-18: qty 8

## 2021-12-18 MED ORDER — ALBUTEROL SULFATE HFA 108 (90 BASE) MCG/ACT IN AERS
2.0000 | INHALATION_SPRAY | Freq: Four times a day (QID) | RESPIRATORY_TRACT | Status: DC | PRN
  Filled 2021-12-18: qty 6.7

## 2021-12-18 NOTE — H&P (Addendum)
History and Physical   Maria [name] XFG:[id number] DOB: 07/13/1956 DOA: 12/18/2021  PCP: Biagio Borg, MD   Patient coming from: Home  Chief Complaint: Edema, shortness of breath  HPI: Maria [name] is a 65 y.o. female with medical history significant of lymphedema, CHF, anxiety, depression, chronic pain, CKD 3A, diabetes, hypertension, gait disorder, fatty liver, GERD, hyperlipidemia, OSA, RLS, sacral decubitus ulcer presenting with worsening shortness of breath and lower extremity edema.  Patient has known history of diastolic heart failure and has had worsening edema for a 3 to 4 weeks.  She states that 2 weeks ago her torsemide was increased and metolazone was added however she has continued to have symptoms and they have actually been worsening.  For the past 3 days she has noticed a 9 pound weight gain and now has shortness of breath on exertion and at rest as well as some orthopnea.  She had similar presentation in October which improved with diuresis.  She denies fevers, chills, chest pain, Don[place] pain, constipation, diarrhea, nausea, vomiting.   ED Course: Vital signs in the ED significant for blood pressure in the 967E to 938B systolic.  Lab work-up showed BMP with potassium 3.4, chloride 90, bicarb 38, BUN 28 and creatinine 1.4 similar to previous of 1.2.  CBC within normal limits.  BNP not elevated however this could be falsely low in the setting of patient's obesity.  Troponin normal on first check with repeat pending.  Chest x-ray showed cardiomegaly but did not show acute disease.  Patient received dose of Lasix and oxycodone in the ED.  Review of Systems: As per HPI otherwise all other systems reviewed and are negative.  Past Medical History:  Diagnosis Date   Acute lymphadenitis 2011   ALLERGIC RHINITIS 08/10/2007   ANXIETY 12/06/2007   Atherosclerotic peripheral vascular disease ([place]) 06/13/2013   Aorta on CT June 2014   Cellulitis 2011   Cervical disc disease  03/09/2012   CHF (congestive heart failure) ([place])    Chronic pain 03/09/2012   DEPRESSION 12/06/2007   Diabetes ([place])    DIVERTICULOSIS, [place] 12/06/2007   GERD 12/06/2007   Hepatitis    age 22 hepatitis A   HLD (hyperlipidemia) 05/24/2019   HYPERTENSION 12/06/2007   Lumbar disc disease 03/09/2012   Mesenteric adenitis    MRSA 2006   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 2010   [place]  may 24th 2010   COLONOSCOPY WITH PROPOFOL N/A 07/10/2016   Procedure: COLONOSCOPY WITH PROPOFOL;  Surgeon: Manus Gunning, MD;  Location: [facility];  Service: Gastroenterology;  Laterality: N/A;   COLONOSCOPY WITH PROPOFOL N/A 09/26/2019   Procedure: COLONOSCOPY WITH PROPOFOL;  Surgeon: Yetta Flock, MD;  Location: [facility];  Service: Gastroenterology;  Laterality: N/A;   ESOPHAGOGASTRODUODENOSCOPY (EGD) WITH PROPOFOL N/A 07/10/2016   Procedure: ESOPHAGOGASTRODUODENOSCOPY (EGD) WITH PROPOFOL;  Surgeon: Manus Gunning, MD;  Location: [facility];  Service: Gastroenterology;  Laterality: N/A;   POLYPECTOMY  09/26/2019   Procedure: POLYPECTOMY;  Surgeon: Yetta Flock, MD;  Location: [facility];  Service: Gastroenterology;;   s/p ovary cyst     s/p right knee arthroscopy     Dr. Mardelle Matte ortho    Social History  reports that she quit smoking about  13 years ago. Her smoking use included cigarettes. She has never used smokeless tobacco. She reports that she does not drink alcohol and does not use drugs.  Allergies  Allergen Reactions   Amoxicillin-Pot Clavulanate Nausea And Vomiting    Projectile vomiting Did it involve swelling of the face/tongue/throat, SOB, or low BP? No Did it involve sudden or severe rash/hives, skin peeling, or any  reaction on the inside of your mouth or nose? No Did you need to seek medical attention at a hospital or doctor's office? No When did it last happen?      20-30 years If all above answers are NO, may proceed with cephalosporin use.    Adhesive [Tape] Other (See Comments)    Tears skin off - use paper tape    Codeine Other (See Comments)    hallucinations   Crestor [Rosuvastatin Calcium]     Severe muscle cramps   Morphine And Related Nausea And Vomiting and Other (See Comments)    Migraine headaches   Vicodin [Hydrocodone-Acetaminophen] Other (See Comments)    hallucinations   Latex Rash    Family History  Problem Relation Age of Onset   Heart disease Mother    Hypertension Mother    Diabetes Mother    Heart failure Mother    Asthma Sister    Anxiety disorder Sister    Depression Sister    Hypertension Father    Asthma Daughter    Bipolar disorder Daughter    Cancer Maternal Uncle        [place]   Cancer Other        ovarian   Liver cancer Paternal Grandmother        ????  Reviewed on admission  Prior to Admission medications   Medication Sig Start Date End Date Taking? Authorizing Provider  Accu-Chek Softclix Lancets lancets Use as directed up to 4 times daily Patient taking differently: 1 each by Other route in the morning, at noon, in the evening, and at bedtime. 04/11/21   Elodia Florence., MD  acetaminophen (TYLENOL) 325 MG tablet Take 650 mg by mouth every 6 (six) hours as needed for headache or fever (pain).    [provider]  albuterol (PROAIR HFA) 108 (90 Base) MCG/ACT inhaler Inhale 2 puffs into the lungs every 6 (six) hours as needed for wheezing or shortness of breath. 11/05/20   Lucretia Kern, DO  Blood Glucose Monitoring Suppl (ACCU-CHEK GUIDE) w/Device KIT Use as directed Patient taking differently: 1 each by Other route as directed. 04/11/21   Elodia Florence., MD  Cholecalciferol (VITAMIN D3) 50 MCG (2000 UT) CAPS Take 2 capsules by  mouth daily.    [provider]  clonazePAM (KLONOPIN) 1 MG tablet TAKE 1 TABLET BY MOUTH 2 TIMES A DAY AS NEEDED FOR ANXIETY Patient taking differently: Take 1 mg by mouth 2 (two) times daily as needed for anxiety. 12/10/20   Biagio Borg, MD  cyclobenzaprine (FLEXERIL) 10 MG tablet TAKE 1 TABLET BY MOUTH 2 TIMES A DAY AS NEEDED FOR MUSCLE SPASMS Patient taking differently: Take 10 mg by mouth 2 (two) times daily as needed for muscle spasms. 02/27/21   Biagio Borg, MD  Fexofenadine HCl (ALLEGRA PO) Take 180 mg by mouth daily as needed (allergies).    [provider]  glucose blood (ACCU-CHEK GUIDE) test strip Use as instructed up to 4 times daily Patient taking differently: Use as instructed up to 4 times daily 04/11/21  Elodia Florence., MD  losartan (COZAAR) 50 MG tablet Take 1 tablet (50 mg total) by mouth daily. 09/09/21   Almyra Deforest, PA  Metamucil Fiber CHEW Chew 3 tablets by mouth at bedtime.    [provider]  metFORMIN (GLUCOPHAGE) 500 MG tablet Take 1 tablet (500 mg total) by mouth 2 (two) times daily with a meal. Patient not taking: Reported on 11/20/2021 11/19/21 01/18/22  Biagio Borg, MD  metolazone (ZAROXOLYN) 5 MG tablet Take 1 tablet (5 mg total) by mouth as needed for up to 3 days (take as instructed by your cardiologist). For 3 days 12/16/21 12/19/21  Almyra Deforest, PA  metoprolol tartrate (LOPRESSOR) 25 MG tablet Take 25 mg by mouth 2 (two) times daily.    [provider]  NARCAN 4 MG/0.1ML LIQD nasal spray kit Place 0.4 mg into the nose daily as needed (opioid overdose). 04/25/19   [provider]  ondansetron (ZOFRAN ODT) 4 MG disintegrating tablet Take 1 tablet (4 mg total) by mouth every 4 (four) hours as needed for nausea or vomiting. 08/23/19   Charlesetta Shanks, MD  Oxycodone HCl 10 MG TABS Take 1 tablet (10 mg total) 4 (four) times daily by mouth. Per [facility] Patient taking differently: Take 10 mg by mouth every 4  (four) hours as needed (pain). Per [facility] 11/08/17   Biagio Borg, MD  OZEMPIC, 1 MG/DOSE, 4 MG/3ML SOPN Inject 1 mg into the skin once a week. 08/05/21   [provider]  pantoprazole (PROTONIX) 40 MG tablet TAKE ONE TABLET BY MOUTH DAILY Patient taking differently: Take 40 mg by mouth daily. 03/03/21   Biagio Borg, MD  Polyethyl Glycol-Propyl Glycol 0.4-0.3 % SOLN Apply 1 drop to eye daily as needed (dry eyes).    [provider]  polyethylene glycol powder (GLYCOLAX/MIRALAX) 17 GM/SCOOP powder Take 17 g by mouth daily as needed for mild constipation. 04/11/21   Elodia Florence., MD  potassium chloride SA (KLOR-CON) 20 MEQ tablet Take 1 tablet (20 mEq total) by mouth 2 (two) times daily. 06/05/21 11/20/21  Minus Breeding, MD  rOPINIRole (REQUIP) 1 MG tablet Take 1 tablet (1 mg total) by mouth 3 (three) times daily. 11/25/21   Biagio Borg, MD  Simethicone (GAS-X EXTRA STRENGTH PO) Take 1 tablet by mouth daily as needed (gas).    [provider]  torsemide (DEMADEX) 20 MG tablet Take 2 tablets (40 mg total) by mouth 2 (two) times daily. 11/21/21   Minus Breeding, MD  DULoxetine (CYMBALTA) 60 MG capsule Take 60 mg by mouth daily.    03/09/12  [provider]    Physical Exam: Vitals:   12/18/21 1605 12/18/21 1800 12/18/21 2030 12/18/21 2123  BP: 115/64 117/60 (!) 110/53 (!) 105/53  Pulse: 69 70 72 71  Resp:  '17 16 18  ' Temp:      TempSrc:      SpO2: 100% 98% 97% 99%   Physical Exam Constitutional:      General: She is not in acute distress.    Appearance: Normal appearance.  HENT:     Head: Normocephalic and atraumatic.     Mouth/Throat:     Mouth: Mucous membranes are moist.     Pharynx: Oropharynx is clear.  Eyes:     Extraocular Movements: Extraocular movements intact.     Pupils: Pupils are equal, round, and reactive to light.  Cardiovascular:     Rate and Rhythm: Normal  rate and regular rhythm.     Pulses: Normal pulses.      Heart sounds: Murmur heard.  Pulmonary:     Effort: Pulmonary effort is normal. No respiratory distress.     Breath sounds: Normal breath sounds.  Abdominal:     General: Bowel sounds are normal. There is no distension.     Palpations: Abdomen is soft.     Tenderness: There is no abdominal tenderness.  Musculoskeletal:        General: No swelling or deformity.     Right lower leg: Edema present.     Left lower leg: Edema present.  Skin:    General: Skin is warm and dry.  Neurological:     General: No focal deficit present.     Mental Status: Mental status is at baseline.   Labs on Admission: I have personally reviewed following labs and imaging studies  CBC: Recent Labs  Lab 12/18/21 1232  WBC 9.5  HGB 12.0  HCT 35.8*  MCV 89.9  PLT 295    Basic Metabolic Panel: Recent Labs  Lab 12/18/21 1424  NA 137  K 3.4*  CL 90*  CO2 38*  GLUCOSE 114*  BUN 28*  CREATININE 1.40*  CALCIUM 8.9    GFR: CrCl cannot be calculated (Unknown ideal weight.).  Liver Function Tests: No results for input(s): AST, ALT, ALKPHOS, BILITOT, PROT, ALBUMIN in the last 168 hours.  Urine analysis:    Component Value Date/Time   COLORURINE YELLOW 09/19/2021 1400   APPEARANCEUR HAZY (A) 09/19/2021 1400   LABSPEC 1.005 09/19/2021 1400   PHURINE 7.0 09/19/2021 1400   GLUCOSEU NEGATIVE 09/19/2021 1400   GLUCOSEU NEGATIVE 07/07/2021 1621   HGBUR SMALL (A) 09/19/2021 1400   BILIRUBINUR NEGATIVE 09/19/2021 1400   KETONESUR NEGATIVE 09/19/2021 1400   PROTEINUR NEGATIVE 09/19/2021 1400   UROBILINOGEN 0.2 07/07/2021 1621   NITRITE NEGATIVE 09/19/2021 1400   LEUKOCYTESUR MODERATE (A) 09/19/2021 1400    Radiological Exams on Admission: DG Chest 2 View  Result Date: 12/18/2021 CLINICAL DATA:  Shortness of breath EXAM: CHEST - 2 VIEW COMPARISON:  Chest x-ray 09/19/2021 FINDINGS: Heart is enlarged. Mediastinum appears stable. Mild calcified plaques in the aortic arch. Pulmonary vasculature  is within normal limits. No focal consolidation identified. No pleural effusion or pneumothorax. IMPRESSION: Cardiomegaly with no acute process identified. Electronically Signed   By: Ofilia Neas M.D.   On: 12/18/2021 12:46    EKG: Independently reviewed.  Normal sinus rhythm at 69 bpm.  Nonspecific T wave flattening in aVL.  Low voltage QRS.  Similar to previous.  Assessment/Plan Principal Problem:   CHF exacerbation (HCC) Active Problems:   Anxiety state   Depression   Essential hypertension   Gastroesophageal reflux disease without esophagitis   Chronic pain syndrome   Acquired lymphedema   OSA (obstructive sleep apnea)   Diabetes mellitus type 2 in obese (HCC)   CKD (chronic kidney disease), stage IIIa (HCC)  CHF exacerbation > Patient presenting with increasing shortness of breath, dyspnea on exertion, orthopnea, weight gain despite increasing outpatient diuretics. > Lab work-up in ED is fairly benign with normal BNP though this may be falsely low in the setting of obesity and chest x-ray without significant edema however she had similar presentation in October which improved with diuresis. > 80 mg IV Lasix administered in the ED. - Monitor on telemetry - Continue with 80 mg IV Lasix twice daily - Strict I's and O's, daily weights - Hold off on echocardiogram  considering recent echo 2 months ago - Trend renal function and electrolytes, check magnesium now - Replete potassium with 40 mEq p.o. - Continue home losartan, metoprolol  Chronic pain - Continue home oxycodone  Hypertension - Continue losartan, metoprolol - Diuresis as above  Diabetes - SSI  GERD - Continue home PPI  CKD 3 > Creatinine stable in the ED at 1.4 from baseline of 1.2. - Plan avoid nephrotoxic agent - Trend renal function and electrolytes  Anxiety Depression - Continue home as needed clonazepam  RLS - Continue home ropinirole  OSA - CPAP   DVT prophylaxis: Lovenox  Code Status:    Full  Family Communication:  None on admission.  Patient states she is updated her family over the phone and that they know she is at the hospital and that she is seeing for her heart failure. Disposition Plan:   Patient is from:  Home  Anticipated DC to:  Home  Anticipated DC date:  1 to 3 days  Anticipated DC barriers: None  Consults called:  None  Admission status:  Observation, telemetry   Severity of Illness: The appropriate patient status for this patient is OBSERVATION. Observation status is judged to be reasonable and necessary in order to provide the required intensity of service to ensure the patient's safety. The patient's presenting symptoms, physical exam findings, and initial radiographic and laboratory data in the context of their medical condition is felt to place them at decreased risk for further clinical deterioration. Furthermore, it is anticipated that the patient will be medically stable for discharge from the hospital within 2 midnights of admission.     Marcelyn Bruins MD [facility]  How to contact the [facility] Attending or Consulting provider [place] or covering provider during after hours [place], for this patient?   Check the care team in [facility] and look for a) attending/consulting [facility] provider listed and b) the [facility] team listed Log into [email or web address] and use [place]'s universal password to access. If you do not have the password, please contact the hospital operator. Locate the [facility] provider you are looking for under [facility] and page to a number that you can be directly reached. If you still have difficulty reaching the provider, please page the [place] (Director on Call) for the Hospitalists listed on amion for assistance.  12/18/2021, 9:46 PM

## 2021-12-18 NOTE — ED Provider Notes (Signed)
[facility] EMERGENCY DEPARTMENT Provider Note   CSN: [id number] Arrival date & time: 12/18/21  1202     History Chief Complaint  Patient presents with   edema   Shortness of Breath    Maria [name] is a 65 y.o. female with past medical history significant for HFpEF (EF 65-70%% on 09/20/2021), HLD, HTN, CKDIII, DM, obesity who presents with worsening extremity swelling and shortness of breath.  Patient states that about 2 weeks ago her diuretic medications were changed to torsemide and metolazone due to worsening symptoms associated with her heart failure including shortness of breath with exertion and leg swelling.  She says despite these medication changes her symptoms have continued to get worse over the past 2 weeks.  She now has severe shortness of breath with exertion, orthopnea, PND, and lower extremity swelling worse than baseline.  She denies any recent fevers, chills, nausea, vomiting, abdominal pain, urinary symptoms, or changes to bowel movements.  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   THORACIC/LUMBOSACRAL NEURITIS/RADICULITIS UNSPEC 12/28/2008    Patient Active Problem List   Diagnosis Date Noted   CHF exacerbation ([place]) 12/18/2021   Leg swelling 11/20/2021   Bilateral knee pain 10/29/2021   Hypernatremia 09/24/2021   CKD (chronic kidney disease), stage IIIa ([place])  09/24/2021   Sacral decubitus ulcer, stage II ([place]) 09/20/2021   Acute on chronic diastolic CHF (congestive heart failure) ([place]) 09/19/2021   Nail disorder 07/07/2021   Pre-ulcerative corn or callous 07/07/2021   Rash 96/28/3662   Diastolic congestive heart failure ([place]) 03/17/2021   Diabetes mellitus type 2 in obese ([place]) 03/11/2021   Vitamin D deficiency 01/01/2021   Medial epicondylitis 12/31/2020   Aortic atherosclerosis ([place]) 94/76/5465   Umbilical hernia 03/54/6568   Constipation 08/05/2020   Arthritis 06/12/2020   Hoarseness 05/01/2020   Vitamin B 12 deficiency 01/31/2020   Rosacea 01/31/2020   Neck swelling 01/31/2020   History of colonic polyps    Benign neoplasm of [place]    Nausea 08/31/2019   Throat pain 07/26/2019   HLD (hyperlipidemia) 05/24/2019   Leg cramps 05/24/2019   Lump in neck 05/24/2019   Left thyroid nodule 01/25/2019   Jerking movements of extremities 07/20/2018   Balance disorder 07/20/2018   Right knee pain 03/21/2018   OSA (obstructive sleep apnea) 03/21/2018   Oxygen desaturation 03/21/2018   Urinary symptom or sign 03/21/2018   Acquired lymphedema 01/25/2018   Gait disorder 01/25/2018   Peripheral edema 01/16/2018   SOB (shortness of breath) 01/15/2018   Dysphonia 12/07/2017   Encounter for well adult exam with abnormal findings 11/08/2017   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[place]) 05/31/2017   [place] polyp 08/11/2016   Abdominal pain, epigastric    Dysphagia    [place] cancer screening    ACE-inhibitor cough 12/05/2015   Restless legs syndrome 10/08/2015   Venous stasis  dermatitis of both lower extremities 04/26/2015   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/17/2014   AR (allergic rhinitis) 10/17/2014   Fatty liver 10/17/2014   Chronic pain syndrome 09/19/2014   Post-traumatic osteoarthritis of both knees 09/19/2014   Spinal stenosis 09/19/2014   Depression, major, single episode, complete remission ([place]) 09/19/2014   Narcotic dependence ([place])  09/19/2014   Spondylosis of 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 03/09/2012   Lumbar disc disease 03/09/2012   Chronic pain 03/09/2012   Morbid obesity ([place]) 06/17/2011   Chronic low back pain 12/28/2008   Anxiety state 12/06/2007   Depression 12/06/2007   Essential hypertension 12/06/2007   Gastroesophageal reflux disease without esophagitis 12/06/2007   LOW BACK PAIN 12/06/2007    Past Surgical History:  Procedure Laterality Date   ABDOMINAL HYSTERECTOMY  1999   1 ovary left   bloo clot removed from neck   may 25th , june 2. 2010   [place]  may 24th 2010   COLONOSCOPY WITH PROPOFOL N/A 07/10/2016   Procedure: COLONOSCOPY WITH PROPOFOL;  Surgeon: Manus Gunning, MD;  Location: [facility];  Service: Gastroenterology;  Laterality: N/A;   COLONOSCOPY WITH PROPOFOL N/A 09/26/2019   Procedure: COLONOSCOPY WITH PROPOFOL;  Surgeon: Yetta Flock, MD;  Location: [facility];  Service: Gastroenterology;  Laterality: N/A;   ESOPHAGOGASTRODUODENOSCOPY (EGD) WITH PROPOFOL N/A 07/10/2016   Procedure: ESOPHAGOGASTRODUODENOSCOPY (EGD) WITH PROPOFOL;  Surgeon: Manus Gunning, MD;  Location: [facility];  Service: Gastroenterology;  Laterality: N/A;   POLYPECTOMY  09/26/2019   Procedure: POLYPECTOMY;  Surgeon: Yetta Flock, MD;  Location: [facility];  Service: Gastroenterology;;   s/p ovary cyst     s/p right knee arthroscopy     Dr. Mardelle Matte ortho     OB History   No obstetric history on file.     Family History  Problem Relation Age of Onset   Heart disease Mother    Hypertension Mother    Diabetes Mother    Heart failure Mother    Asthma Sister    Anxiety disorder Sister    Depression Sister    Hypertension Father    Asthma Daughter    Bipolar disorder Daughter    Cancer Maternal Uncle        [place]   Cancer Other        ovarian   Liver cancer Paternal Grandmother        ????    Social History   Tobacco Use    Smoking status: Former    Years: 30.00    Types: Cigarettes    Quit date: 11/08/2008    Years since quitting: 13.1   Smokeless tobacco: Never   Tobacco comments:    quit 10/09  Vaping Use   Vaping Use: Never used  Substance Use Topics   Alcohol use: No    Comment: quit drinking 10/07/1997   Drug use: No    Home Medications Prior to Admission medications   Medication Sig Start Date End Date Taking? Authorizing Provider  acetaminophen (TYLENOL) 325 MG tablet Take 650 mg by mouth every 6 (six) hours as needed for headache or fever (pain).   Yes [provider]  albuterol (PROAIR HFA) 108 (90 Base) MCG/ACT inhaler Inhale 2 puffs into the lungs every 6 (six) hours as needed for wheezing or shortness of breath. 11/05/20  Yes Colin Benton R, DO  clonazePAM (KLONOPIN) 1 MG tablet TAKE 1 TABLET BY MOUTH 2 TIMES A DAY AS NEEDED FOR ANXIETY  Patient taking differently: Take 1 mg by mouth 2 (two) times daily as needed for anxiety. 12/10/20  Yes Biagio Borg, MD  Fexofenadine HCl (ALLEGRA PO) Take 180 mg by mouth daily as needed (allergies).   Yes [provider]  losartan (COZAAR) 50 MG tablet Take 1 tablet (50 mg total) by mouth daily. 09/09/21  Yes Meng, Isaac Laud, PA  Metamucil Fiber CHEW Chew 3 tablets by mouth at bedtime.   Yes [provider]  Metamucil Fiber CHEW Chew 3 tablets by mouth at bedtime.   Yes [provider]  metolazone (ZAROXOLYN) 5 MG tablet Take 1 tablet (5 mg total) by mouth as needed for up to 3 days (take as instructed by your cardiologist). For 3 days 12/16/21 12/19/21 Yes Almyra Deforest, PA  metoprolol tartrate (LOPRESSOR) 25 MG tablet Take 25 mg by mouth 2 (two) times daily.   Yes [provider]  NARCAN 4 MG/0.1ML LIQD nasal spray kit Place 0.4 mg into the nose daily as needed (opioid overdose). 04/25/19  Yes [provider]  Oxycodone HCl 10 MG TABS Take 1 tablet (10 mg total) 4 (four) times daily by mouth. Per [facility] Patient taking differently: Take 10 mg by mouth every 4 (four) hours as needed (pain). Per [facility] 11/08/17  Yes Biagio Borg, MD  pantoprazole (PROTONIX) 40 MG tablet TAKE ONE TABLET BY MOUTH DAILY Patient taking differently: Take 40 mg by mouth daily. 03/03/21  Yes Biagio Borg, MD  Polyethyl Glycol-Propyl Glycol 0.4-0.3 % SOLN Apply 1 drop to eye daily as needed (dry eyes).   Yes [provider]  potassium chloride SA (KLOR-CON) 20 MEQ tablet Take 1 tablet (20 mEq total) by mouth 2 (two) times daily. 06/05/21 12/18/21 Yes Minus Breeding, MD  rOPINIRole (REQUIP) 1 MG tablet Take 1 tablet (1 mg total) by mouth 3 (three) times daily. 11/25/21  Yes Biagio Borg, MD  torsemide (DEMADEX) 20 MG tablet Take 2 tablets (40 mg total) by mouth 2 (two) times daily. 11/21/21  Yes Minus Breeding, MD  Accu-Chek Softclix Lancets lancets Use as directed up to 4 times daily Patient taking differently: 1 each by Other route in the morning, at noon, in the evening, and at bedtime. 04/11/21   Elodia Florence., MD  Blood Glucose Monitoring Suppl (ACCU-CHEK GUIDE) w/Device KIT Use as directed Patient taking differently: 1 each by Other route as directed. 04/11/21   Elodia Florence., MD  cyclobenzaprine (FLEXERIL) 10 MG tablet TAKE 1 TABLET BY MOUTH 2 TIMES A DAY AS NEEDED FOR MUSCLE SPASMS Patient not taking: Reported on 12/18/2021 02/27/21   Biagio Borg, MD  glucose blood (ACCU-CHEK GUIDE) test strip Use as instructed up to 4 times daily 04/11/21   Elodia Florence., MD  metFORMIN (GLUCOPHAGE) 500 MG tablet Take 1 tablet (500 mg total) by mouth 2 (two) times daily with a meal. Patient not taking: Reported on 11/20/2021 11/19/21 01/18/22  Biagio Borg, MD  ondansetron (ZOFRAN ODT) 4 MG disintegrating tablet Take 1 tablet (4 mg total) by mouth every 4 (four) hours as needed for nausea or vomiting. Patient not taking: Reported on 12/18/2021 08/23/19   Charlesetta Shanks, MD   polyethylene glycol powder (GLYCOLAX/MIRALAX) 17 GM/SCOOP powder Take 17 g by mouth daily as needed for mild constipation. Patient not taking: Reported on 12/18/2021 04/11/21   Elodia Florence., MD  DULoxetine (CYMBALTA) 60 MG capsule Take 60 mg by mouth daily.  03/09/12  [provider]    Allergies    Amoxicillin-pot clavulanate, Adhesive [tape], Codeine, Crestor [rosuvastatin calcium], Morphine and related, Vicodin [hydrocodone-acetaminophen], and Latex  Review of Systems   Review of Systems  Constitutional:  Negative for chills and fever.  HENT:  Negative for ear pain and sore throat.   Eyes:  Negative for visual disturbance.  Respiratory:  Positive for cough, chest tightness and shortness of breath.   Cardiovascular:  Positive for palpitations and leg swelling. Negative for chest pain.  Gastrointestinal:  Negative for abdominal pain, constipation, diarrhea, nausea and vomiting.  Genitourinary:  Positive for decreased urine volume. Negative for dysuria and hematuria.  Musculoskeletal:  Negative for arthralgias and back pain.  Skin:  Negative for color change and rash.  Neurological:  Negative for seizures and syncope.  All other systems reviewed and are negative.  Physical Exam Updated Vital Signs BP 110/64    Pulse 73    Temp 98.3 F (36.8 C) (Oral)    Resp (!) 24    SpO2 99%   Physical Exam Vitals and nursing note reviewed.  Constitutional:      General: She is not in acute distress.    Appearance: She is well-developed. She is obese.  HENT:     Head: Normocephalic and atraumatic.     Right Ear: External ear normal.     Left Ear: External ear normal.     Nose: Nose normal.     Mouth/Throat:     Mouth: Mucous membranes are moist.     Pharynx: Oropharynx is clear.  Eyes:     Extraocular Movements: Extraocular movements intact.     Conjunctiva/sclera: Conjunctivae normal.     Pupils: Pupils are equal, round, and reactive to light.  Cardiovascular:      Rate and Rhythm: Normal rate and regular rhythm.     Heart sounds: No murmur heard. Pulmonary:     Effort: Pulmonary effort is normal. No respiratory distress.     Breath sounds: Normal breath sounds. No wheezing, rhonchi or rales.  Abdominal:     General: There is no distension.     Palpations: Abdomen is soft.     Tenderness: There is no abdominal tenderness.  Musculoskeletal:     Cervical back: Neck supple.     Right lower leg: Edema present.     Left lower leg: Edema present.     Comments: Bilateral lower extremity lymphedema with mild venous stasis dermatitis  Skin:    General: Skin is warm and dry.     Capillary Refill: Capillary refill takes less than 2 seconds.  Neurological:     General: No focal deficit present.     Mental Status: She is alert and oriented to person, place, and time.  Psychiatric:        Mood and Affect: Mood normal.    ED Results / Procedures / Treatments   Labs (all labs ordered are listed, but only abnormal results are displayed) Labs Reviewed  CBC - Abnormal; Notable for the following components:      Result Value   HCT 35.8 (*)    All other components within normal limits  BASIC METABOLIC PANEL - Abnormal; Notable for the following components:   Potassium 3.4 (*)    Chloride 90 (*)    CO2 38 (*)    Glucose, Bld 114 (*)    BUN 28 (*)    Creatinine, Ser 1.40 (*)    GFR, Estimated 42 (*)  All other components within normal limits  RESP PANEL BY RT-PCR (FLU A&B, COVID) ARPGX2  BRAIN NATRIURETIC PEPTIDE  MAGNESIUM  BASIC METABOLIC PANEL  CBC  TROPONIN I (HIGH SENSITIVITY)  TROPONIN I (HIGH SENSITIVITY)    EKG None  Radiology DG Chest 2 View  Result Date: 12/18/2021 CLINICAL DATA:  Shortness of breath EXAM: CHEST - 2 VIEW COMPARISON:  Chest x-ray 09/19/2021 FINDINGS: Heart is enlarged. Mediastinum appears stable. Mild calcified plaques in the aortic arch. Pulmonary vasculature is within normal limits. No focal consolidation  identified. No pleural effusion or pneumothorax. IMPRESSION: Cardiomegaly with no acute process identified. Electronically Signed   By: Ofilia Neas M.D.   On: 12/18/2021 12:46    Procedures Procedures   Medications Ordered in ED Medications  oxyCODONE (Oxy IR/ROXICODONE) immediate release tablet 10 mg (has no administration in time range)  losartan (COZAAR) tablet 50 mg (has no administration in time range)  metoprolol tartrate (LOPRESSOR) tablet 25 mg (has no administration in time range)  ondansetron (ZOFRAN-ODT) disintegrating tablet 4 mg (has no administration in time range)  pantoprazole (PROTONIX) EC tablet 40 mg (has no administration in time range)  polyethylene glycol (MIRALAX / GLYCOLAX) packet 17 g (has no administration in time range)  simethicone (MYLICON) chewable tablet 120 mg (has no administration in time range)  naloxone (NARCAN) nasal spray 4 mg/0.1 mL (has no administration in time range)  clonazePAM (KLONOPIN) tablet 1 mg (has no administration in time range)  rOPINIRole (REQUIP) tablet 1 mg (has no administration in time range)  albuterol (VENTOLIN HFA) 108 (90 Base) MCG/ACT inhaler 2 puff (has no administration in time range)  furosemide (LASIX) injection 80 mg (has no administration in time range)  enoxaparin (LOVENOX) injection 40 mg (has no administration in time range)  sodium chloride flush (NS) 0.9 % injection 3 mL (has no administration in time range)  acetaminophen (TYLENOL) tablet 650 mg (has no administration in time range)    Or  acetaminophen (TYLENOL) suppository 650 mg (has no administration in time range)  insulin aspart (novoLOG) injection 0-15 Units (has no administration in time range)  potassium chloride (KLOR-CON) packet 40 mEq (has no administration in time range)  polyvinyl alcohol (LIQUIFILM TEARS) 1.4 % ophthalmic solution 1 drop (has no administration in time range)  oxyCODONE (Oxy IR/ROXICODONE) immediate release tablet 10 mg (10 mg  Oral Given 12/18/21 2122)  furosemide (LASIX) injection 80 mg (80 mg Intravenous Given 12/18/21 2124)    ED Course  I have reviewed the triage vital signs and the nursing notes.  Pertinent labs & imaging results that were available during my care of the patient were reviewed by me and considered in my medical decision making (see chart for details).    MDM Rules/Calculators/A&P                          Patient presents with worsening exertional shortness of breath and associated symptoms as described in HPI above.  Physical exam significant for lower extremity lymphedema, otherwise unremarkable.  Presentation most consistent with acute on chronic heart failure exacerbation although ACS, viral illness also on differential.  EKG shows NSR with TWI in V2, otherwise unremarkable and no significant changes compared to prior. CXR with cardiomegaly, otherwise unremarkable.  No focal consolidation to suggest pneumonia or significant pulmonary edema.  BMP with mild hypokalemia (3.4), mild hypochloremia (90), elevated bicarb (38), and elevated creatinine (1.4).  Renal function has fluctuated from 1.15-1.5 and currently appears close  to baseline.  Patient's BNP today was 18.  Troponin normal, doubt ACS.  Patient's clinical presentation is most consistent with HFpEF exacerbation despite normal BNP.  She had a similar admission back in October and improved with IV diuretics while in the hospital.  IV Lasix 80 mg ordered.  I discussed the patient with hospitalist medicine who will admit the patient for continued care and management.  Final Clinical Impression(s) / ED Diagnoses Final diagnoses:  None    Rx / DC Orders ED Discharge Orders     None        , Amalia Hailey, MD 12/18/21 2203    Orpah Greek, MD 12/24/21 [phone]

## 2021-12-18 NOTE — ED Triage Notes (Addendum)
Pt to triage via [facility] from home.  Reports SOB and fluid retention x 3 days.  Reports decreased output and weight gain of 9 lbs over 3 days.  Denies chest pain but reports "indigestion".  Hx of CHF.   SOB worse with exertion.  EMS- 110/64 CBG 165 Sats 98% on RA

## 2021-12-18 NOTE — ED Provider Notes (Signed)
Emergency Medicine Provider Triage Evaluation Note  Maria [name] , a 65 y.o. female  was evaluated in triage.  Pt complains of bilateral lower extremity swelling x3 days.  Patient has associated shortness of breath and fluid retention x3 days, weight gain (9 pounds over 3 days), shortness of breath with exertion, indigestion.  She does not wear oxygen at baseline, however, EMS placed patient on O2 via nasal cannula for comfort.  Patient takes turosemide for her CHF.  Denies chest pain, abdominal pain, nausea, vomiting, fever, chills.  Review of Systems  Positive: Shortness of breath, fluid retention Negative: Chest pain, abdominal pain  Physical Exam  BP 116/60 (BP Location: Right Arm)    Pulse 72    Temp 98.3 F (36.8 C) (Oral)    Resp 17    SpO2 98%  Gen:   Awake, no distress   Resp:  Normal effort  MSK:   Moves extremities without difficulty  Other:  Bilateral 3+ pitting edema to bilateral lower extremities.  Mild tenderness to palpation to bilateral lower extremities.  Medical Decision Making  Medically screening exam initiated at 12:24 PM.  Appropriate orders placed.  Karri A Laurich was informed that the remainder of the evaluation will be completed by another provider, this initial triage assessment does not replace that evaluation, and the importance of remaining in the ED until their evaluation is complete.   Harvey Lingo A, PA-C 12/18/21 1233    Wyvonnia Dusky, MD 12/18/21 [phone]

## 2021-12-18 NOTE — Telephone Encounter (Signed)
Spoke with patient regarding her symptoms patient reports 9 pound weight gain in one week. Patient reports swelling from her abdomen down to her toes. Patient states that she is having difficulty breathing and also reports having muscle spasms, and shaking hands that is a new symptom for her. Patient is unable to come to office today. With patients increase/worsening symptoms patient should report to the ER to be seen. This was discussed with DOD (Dr. Harriet Masson) who agrees with this. Patient states she will call EMS. Advised patient I would forward to MD to make aware as well. Patient verbalized understanding.

## 2021-12-18 NOTE — Telephone Encounter (Signed)
LCSW received a voicemail from pt after clinic hours on 12/28 (6:29pm) inquiring about ride to appt today at 2pm. LCSW returned call, inquired if she had called transportation services directly to arrange ride as she utilizes door to door transportation. Pt had not contacted [employer] I shared that unfortunately, door to door needs roughly 24-48 hours heads up in order to pick patients up for appointments. Pt began to cry and shares multiple sx that she is having and how concerned she is about coming today. LCSW shared that I would let her speak with RN Triage about these concerns and they may be able to advise her about next best steps. Spoke with Dr. Harriet Masson and [place], [place], also about concerns pt had voiced and they do not feel that virtual would be appropriate for pt at this time and recommended pt seek further care at ED. Appreciate Belinda Block, RN, assistance w/ speaking with pt further about her current concerns.   Westley Hummer, MSW, [place]  [phone]- work cell phone (preferred) [phone]- desk phone

## 2021-12-19 DIAGNOSIS — E669 Obesity, unspecified: Secondary | ICD-10-CM | POA: Diagnosis not present

## 2021-12-19 DIAGNOSIS — R0602 Shortness of breath: Secondary | ICD-10-CM | POA: Diagnosis not present

## 2021-12-19 DIAGNOSIS — I959 Hypotension, unspecified: Secondary | ICD-10-CM | POA: Diagnosis present

## 2021-12-19 DIAGNOSIS — E785 Hyperlipidemia, unspecified: Secondary | ICD-10-CM | POA: Diagnosis present

## 2021-12-19 DIAGNOSIS — G2581 Restless legs syndrome: Secondary | ICD-10-CM | POA: Diagnosis present

## 2021-12-19 DIAGNOSIS — N179 Acute kidney failure, unspecified: Secondary | ICD-10-CM | POA: Diagnosis present

## 2021-12-19 DIAGNOSIS — G4733 Obstructive sleep apnea (adult) (pediatric): Secondary | ICD-10-CM | POA: Diagnosis present

## 2021-12-19 DIAGNOSIS — K760 Fatty (change of) liver, not elsewhere classified: Secondary | ICD-10-CM | POA: Diagnosis present

## 2021-12-19 DIAGNOSIS — I10 Essential (primary) hypertension: Secondary | ICD-10-CM | POA: Diagnosis not present

## 2021-12-19 DIAGNOSIS — I130 Hypertensive heart and chronic kidney disease with heart failure and stage 1 through stage 4 chronic kidney disease, or unspecified chronic kidney disease: Secondary | ICD-10-CM | POA: Diagnosis present

## 2021-12-19 DIAGNOSIS — R5382 Chronic fatigue, unspecified: Secondary | ICD-10-CM | POA: Diagnosis present

## 2021-12-19 DIAGNOSIS — I5031 Acute diastolic (congestive) heart failure: Secondary | ICD-10-CM | POA: Diagnosis present

## 2021-12-19 DIAGNOSIS — T502X5A Adverse effect of carbonic-anhydrase inhibitors, benzothiadiazides and other diuretics, initial encounter: Secondary | ICD-10-CM | POA: Diagnosis not present

## 2021-12-19 DIAGNOSIS — L89152 Pressure ulcer of sacral region, stage 2: Secondary | ICD-10-CM | POA: Diagnosis present

## 2021-12-19 DIAGNOSIS — I890 Lymphedema, not elsewhere classified: Secondary | ICD-10-CM | POA: Diagnosis not present

## 2021-12-19 DIAGNOSIS — F32A Depression, unspecified: Secondary | ICD-10-CM | POA: Diagnosis present

## 2021-12-19 DIAGNOSIS — E1122 Type 2 diabetes mellitus with diabetic chronic kidney disease: Secondary | ICD-10-CM | POA: Diagnosis present

## 2021-12-19 DIAGNOSIS — E1151 Type 2 diabetes mellitus with diabetic peripheral angiopathy without gangrene: Secondary | ICD-10-CM | POA: Diagnosis present

## 2021-12-19 DIAGNOSIS — I5030 Unspecified diastolic (congestive) heart failure: Secondary | ICD-10-CM | POA: Diagnosis not present

## 2021-12-19 DIAGNOSIS — I5033 Acute on chronic diastolic (congestive) heart failure: Secondary | ICD-10-CM | POA: Diagnosis not present

## 2021-12-19 DIAGNOSIS — R0600 Dyspnea, unspecified: Secondary | ICD-10-CM | POA: Diagnosis present

## 2021-12-19 DIAGNOSIS — Z6841 Body Mass Index (BMI) 40.0 and over, adult: Secondary | ICD-10-CM | POA: Diagnosis not present

## 2021-12-19 DIAGNOSIS — E1169 Type 2 diabetes mellitus with other specified complication: Secondary | ICD-10-CM | POA: Diagnosis not present

## 2021-12-19 DIAGNOSIS — E876 Hypokalemia: Secondary | ICD-10-CM | POA: Diagnosis present

## 2021-12-19 DIAGNOSIS — F411 Generalized anxiety disorder: Secondary | ICD-10-CM | POA: Diagnosis present

## 2021-12-19 DIAGNOSIS — K219 Gastro-esophageal reflux disease without esophagitis: Secondary | ICD-10-CM | POA: Diagnosis present

## 2021-12-19 DIAGNOSIS — Z20822 Contact with and (suspected) exposure to covid-19: Secondary | ICD-10-CM | POA: Diagnosis present

## 2021-12-19 DIAGNOSIS — N1831 Chronic kidney disease, stage 3a: Secondary | ICD-10-CM | POA: Diagnosis not present

## 2021-12-19 DIAGNOSIS — G894 Chronic pain syndrome: Secondary | ICD-10-CM | POA: Diagnosis not present

## 2021-12-19 LAB — CBC
HCT: 31.9 % — ABNORMAL LOW (ref 36.0–46.0)
Hemoglobin: 10.9 g/dL — ABNORMAL LOW (ref 12.0–15.0)
MCH: 30.5 pg (ref 26.0–34.0)
MCHC: 34.2 g/dL (ref 30.0–36.0)
MCV: 89.4 fL (ref 80.0–100.0)
Platelets: 242 10*3/uL (ref 150–400)
RBC: 3.57 MIL/uL — ABNORMAL LOW (ref 3.87–5.11)
RDW: 13.2 % (ref 11.5–15.5)
WBC: 7 10*3/uL (ref 4.0–10.5)
nRBC: 0 % (ref 0.0–0.2)

## 2021-12-19 LAB — GLUCOSE, CAPILLARY
Glucose-Capillary: 188 mg/dL — ABNORMAL HIGH (ref 70–99)
Glucose-Capillary: 137 mg/dL — ABNORMAL HIGH (ref 70–99)
Glucose-Capillary: 142 mg/dL — ABNORMAL HIGH (ref 70–99)
Glucose-Capillary: 172 mg/dL — ABNORMAL HIGH (ref 70–99)

## 2021-12-19 LAB — SODIUM, URINE, RANDOM: Sodium, Ur: 17 mmol/L

## 2021-12-19 LAB — BASIC METABOLIC PANEL
Anion gap: 9 (ref 5–15)
Anion gap: 8 (ref 5–15)
BUN: 29 mg/dL — ABNORMAL HIGH (ref 8–23)
BUN: 30 mg/dL — ABNORMAL HIGH (ref 8–23)
CO2: 35 mmol/L — ABNORMAL HIGH (ref 22–32)
CO2: 35 mmol/L — ABNORMAL HIGH (ref 22–32)
Calcium: 8.4 mg/dL — ABNORMAL LOW (ref 8.9–10.3)
Calcium: 8.4 mg/dL — ABNORMAL LOW (ref 8.9–10.3)
Chloride: 91 mmol/L — ABNORMAL LOW (ref 98–111)
Chloride: 90 mmol/L — ABNORMAL LOW (ref 98–111)
Creatinine, Ser: 1.54 mg/dL — ABNORMAL HIGH (ref 0.44–1.00)
Creatinine, Ser: 1.55 mg/dL — ABNORMAL HIGH (ref 0.44–1.00)
GFR, Estimated: 37 mL/min — ABNORMAL LOW (ref >60)
GFR, Estimated: 37 mL/min — ABNORMAL LOW (ref >60)
Glucose, Bld: 185 mg/dL — ABNORMAL HIGH (ref 70–99)
Glucose, Bld: 143 mg/dL — ABNORMAL HIGH (ref 70–99)
Potassium: 2.9 mmol/L — ABNORMAL LOW (ref 3.5–5.1)
Potassium: 3.2 mmol/L — ABNORMAL LOW (ref 3.5–5.1)
Sodium: 135 mmol/L (ref 135–145)
Sodium: 133 mmol/L — ABNORMAL LOW (ref 135–145)

## 2021-12-19 LAB — HEMOGLOBIN A1C
Hgb A1c MFr Bld: 6.8 % — ABNORMAL HIGH (ref 4.8–5.6)
Mean Plasma Glucose: 148.46 mg/dL

## 2021-12-19 LAB — MRSA NEXT GEN BY PCR, NASAL: MRSA by PCR Next Gen: NOT DETECTED

## 2021-12-19 LAB — MAGNESIUM: Magnesium: 1.9 mg/dL (ref 1.7–2.4)

## 2021-12-19 MED ORDER — SODIUM CHLORIDE 0.9 % IV BOLUS: 1000.0000 mL | Freq: Once | INTRAVENOUS | Status: DC

## 2021-12-19 MED ORDER — SODIUM CHLORIDE 0.9 % IV SOLN: INTRAVENOUS | Status: DC

## 2021-12-19 MED ORDER — ENOXAPARIN SODIUM 40 MG/0.4ML IJ SOSY
40.0000 mg | PREFILLED_SYRINGE | INTRAMUSCULAR | Status: DC
  Administered 2021-12-19 – 2021-12-20 (×2): 40 mg via SUBCUTANEOUS
  Filled 2021-12-19 (×2): qty 0.4

## 2021-12-19 MED ORDER — POLYETHYLENE GLYCOL 3350 17 G PO PACK
17.0000 g | PACK | Freq: Two times a day (BID) | ORAL | Status: AC
  Administered 2021-12-19 – 2021-12-20 (×3): 17 g via ORAL
  Filled 2021-12-19 (×4): qty 1

## 2021-12-19 MED ORDER — POTASSIUM CHLORIDE CRYS ER 20 MEQ PO TBCR
40.0000 meq | EXTENDED_RELEASE_TABLET | ORAL | Status: AC
  Administered 2021-12-19 (×2): 40 meq via ORAL
  Filled 2021-12-19 (×2): qty 2

## 2021-12-19 MED ORDER — MAGNESIUM OXIDE -MG SUPPLEMENT 400 (240 MG) MG PO TABS
400.0000 mg | ORAL_TABLET | Freq: Two times a day (BID) | ORAL | Status: AC
  Administered 2021-12-19 (×2): 400 mg via ORAL
  Filled 2021-12-19 (×2): qty 1

## 2021-12-19 MED ORDER — OXYCODONE HCL 5 MG PO TABS
15.0000 mg | ORAL_TABLET | ORAL | Status: DC | PRN
  Administered 2021-12-19 – 2021-12-21 (×7): 15 mg via ORAL
  Filled 2021-12-19 (×7): qty 3

## 2021-12-19 NOTE — Consult Note (Addendum)
Cardiology Consultation:   Patient ID: Maria [name] MRN: [id number]; DOB: 25-Mar-1956  Admit date: 12/18/2021 Date of Consult: 12/19/2021  PCP:  Biagio Borg, MD   [facility] Providers Cardiologist:  Minus Breeding, MD   {  Patient Profile:   Maria [name] is a 66 y.o. female with a hx of chronic diastolic heart failure, morbid obesity, hypertension, hyperlipidemia, diabetes mellitus, CKD stage III, anxiety/depression and GERD who is being seen 12/19/2021 for the evaluation of CHF at the request of Dr. Aileen Fass.  Previous CTA of the chest in 2020 showed coronary calcification in the LAD territory. Last echocardiogram September 20, 2021 showed normal LV function at 65 to 70%, intermediate diastolic dysfunction.  Elevated LVEDP.  Intermittently dealing with volume overload for past couple of months.  She had volume overload when seen by Dr. Percival Spanish November 20, 2021.  Added metolazone for 3 days.  Again called 12/27 for volume overload and given metolazone x1.  There was confusion about torsemide dose.  Either 40 or 80mg  BID.   History of Present Illness:   [name] presented to ER yesterday with 3 days history of fluid retention and shortness of breath.  She gained 9 pounds in 3 days.  Reported compliance with medications (has a lot of confusion about torsemide dose) and low-sodium diet.  Patient sleeps chronically on recliner.  Intermittent dizziness.  Denies chest pain, melena or palpitation. She sleeps on recliner chronically.  Chest x-ray showed cardiomegaly BNP low  Scr 1.4 (baseline around 1.1-1.2)  Patient was admitted for acute diastolic heart failure and given IV Lasix 80 mg x 1. However Scr bumped to 1.54>>>1.55.  K 3.4>>2.9>>3.2>> given kdur 79meq x 2. Home Losartan 50mg  on hold  She did not got any lasix today. Cardiology is consulted for further evaluation.   Her weight was 361lb during 10/09/21 visit which felt baseline by Dr. Percival Spanish.   Past Medical  History:  Diagnosis Date   Acute lymphadenitis 2011   ALLERGIC RHINITIS 08/10/2007   ANXIETY 12/06/2007   Atherosclerotic peripheral vascular disease ([place]) 06/13/2013   Aorta on CT June 2014   Cellulitis 2011   Cervical disc disease 03/09/2012   CHF (congestive heart failure) ([place])    Chronic pain 03/09/2012   DEPRESSION 12/06/2007   Diabetes ([place])    DIVERTICULOSIS, [place] 12/06/2007   GERD 12/06/2007   Hepatitis    age 27 hepatitis A   HLD (hyperlipidemia) 05/24/2019   HYPERTENSION 12/06/2007   Lumbar disc disease 03/09/2012   Mesenteric adenitis    MRSA 2006   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 2010   [place]  may 24th 2010   COLONOSCOPY WITH PROPOFOL N/A 07/10/2016   Procedure: COLONOSCOPY WITH PROPOFOL;  Surgeon: Manus Gunning, MD;  Location: [facility];  Service: Gastroenterology;  Laterality: N/A;   COLONOSCOPY WITH PROPOFOL N/A 09/26/2019   Procedure: COLONOSCOPY WITH PROPOFOL;  Surgeon: Yetta Flock, MD;  Location: [facility];  Service: Gastroenterology;  Laterality: N/A;   ESOPHAGOGASTRODUODENOSCOPY (EGD) WITH PROPOFOL N/A 07/10/2016   Procedure: ESOPHAGOGASTRODUODENOSCOPY (EGD) WITH PROPOFOL;  Surgeon: Manus Gunning, MD;  Location: [facility];  Service: Gastroenterology;  Laterality: N/A;   POLYPECTOMY  09/26/2019   Procedure: POLYPECTOMY;  Surgeon: Yetta Flock, MD;  Location: [facility];  Service: Gastroenterology;;   s/p ovary cyst     s/p right knee arthroscopy     Dr. Mardelle Matte ortho    Inpatient Medications: Scheduled Meds:  enoxaparin (LOVENOX) injection  40 mg Subcutaneous Q24H   insulin aspart  0-15 Units Subcutaneous TID WC   magnesium oxide  400 mg Oral BID   metoprolol tartrate  25 mg Oral BID    pantoprazole  40 mg Oral Daily   polyethylene glycol  17 g Oral BID   rOPINIRole  1 mg Oral TID   sodium chloride flush  3 mL Intravenous Q12H   Continuous Infusions:  PRN Meds: acetaminophen **OR** acetaminophen, albuterol, clonazePAM, naloxone, ondansetron, oxyCODONE, polyethylene glycol, polyvinyl alcohol, simethicone  Allergies:    Allergies  Allergen Reactions   Amoxicillin-Pot Clavulanate Nausea And Vomiting    Projectile vomiting Did it involve swelling of the face/tongue/throat, SOB, or low BP? No Did it involve sudden or severe rash/hives, skin peeling, or any reaction on the inside of your mouth or nose? No Did you need to seek medical attention at a hospital or doctor's office? No When did it last happen?      20-30 years If all above answers are NO, may proceed with cephalosporin use.    Adhesive [Tape] Other (See Comments)    Tears skin off - use paper tape    Codeine Other (See Comments)    hallucinations   Crestor [Rosuvastatin Calcium]     Severe muscle cramps   Morphine And Related Nausea And Vomiting and Other (See Comments)    Migraine headaches   Vicodin [Hydrocodone-Acetaminophen] Other (See Comments)    hallucinations   Latex Rash    Social History:   Social History   Socioeconomic History   Marital status: Divorced    Spouse name: Not on file   Number of children: 2   Years of education: Not on file   Highest education level: Not on file  Occupational History   Occupation: disabled  Tobacco Use   Smoking status: Former    Years: 30.00    Types: Cigarettes    Quit date: 11/08/2008    Years since quitting: 13.1   Smokeless tobacco: Never   Tobacco comments:    quit 10/09  Vaping Use   Vaping Use: Never used  Substance and Sexual Activity   Alcohol use: No    Comment: quit drinking 10/07/1997   Drug use: No   Sexual activity: Not Currently    Birth control/protection: Surgical, Abstinence  Other Topics Concern   Not on file   Social History Narrative   Patient has difficulty financially affording food, medication, and affording essential bills. She states she has limited support from her daughter.    Social Determinants of Health   Financial Resource Strain: Not on file  Food Insecurity: No Food Insecurity   Worried About Charity fundraiser in the Last Year: Never true   Ran Out of Food in the Last Year: Never true  Transportation Needs: No Transportation Needs   Lack of Transportation (Medical): No   Lack of Transportation (Non-Medical): No  Physical Activity: Not on file  Stress: Not on file  Social Connections: Not on file  Intimate Partner Violence: Not on file    Family History:   Family History  Problem Relation Age of Onset   Heart disease Mother    Hypertension Mother    Diabetes Mother    Heart failure Mother  Asthma Sister    Anxiety disorder Sister    Depression Sister    Hypertension Father    Asthma Daughter    Bipolar disorder Daughter    Cancer Maternal Uncle        [place]   Cancer Other        ovarian   Liver cancer Paternal Grandmother        ????     ROS:  Please see the history of present illness.  All other ROS reviewed and negative.     Physical Exam/Data:   Vitals:   12/19/21 0414 12/19/21 0415 12/19/21 0845 12/19/21 1129  BP:  (!) 104/55 (!) 119/53 (!) 100/49  Pulse:  85 80 77  Resp:  17 17 18   Temp:  98.2 F (36.8 C) 98.4 F (36.9 C) 98.3 F (36.8 C)  TempSrc:  Oral Oral Oral  SpO2:  92% 97% 96%  Weight: (!) 168.4 kg     Height:        Intake/Output Summary (Last 24 hours) at 12/19/2021 1635 Last data filed at 12/19/2021 0900 Gross per 24 hour  Intake 1080 ml  Output 1950 ml  Net -870 ml   Last 3 Weights 12/19/2021 12/18/2021 11/28/2021  Weight (lbs) 371 lb 4.1 oz 371 lb 7.6 oz 363 lb 6.4 oz  Weight (kg) 168.4 kg 168.5 kg 164.837 kg     Body mass index is 61.78 kg/m.  General:  Morbid obese female in no acute distress HEENT: normal Neck:  JVD difficult to evaluate due to girth  Vascular: No carotid bruits; Distal pulses 2+ bilaterally Cardiac:  normal S1, S2; RRR; no murmur  Lungs:  clear to auscultation bilaterally, no wheezing, rhonchi or rales  Abd: soft, nontender, no hepatomegaly  Ext: no edema Musculoskeletal:  No deformities, BUE and BLE strength normal and equal Skin: warm and dry  Neuro:  CNs 2-12 intact, no focal abnormalities noted Psych:  Normal affect   EKG:  The EKG was personally reviewed and demonstrates:  SR at rate of 69 bpm and non specific ST changes  Telemetry:  Telemetry was personally reviewed and demonstrates:  NSR  Relevant CV Studies: Echo 09/20/2021  1. Left ventricular ejection fraction, by estimation, is 65 to 70%. The  left ventricle has normal function. The left ventricle has no regional  wall motion abnormalities. Left ventricular diastolic parameters are  consistent with Grade II diastolic  dysfunction (pseudonormalization). Elevated left ventricular end-diastolic  pressure.   2. Right ventricular systolic function is normal. The right ventricular  size is normal. Tricuspid regurgitation signal is inadequate for assessing  PA pressure.   3. Left atrial size was upper normal.   4. The mitral valve is grossly normal. No evidence of mitral valve  regurgitation.   5. The aortic valve is tricuspid. Aortic valve regurgitation is not  visualized.   6. The inferior vena cava is dilated in size with >50% respiratory  variability, suggesting right atrial pressure of 8 mmHg.   Comparison(s): Prior images reviewed side by side. LVEF vigorous with  probable increased LVEDP  Laboratory Data:  High Sensitivity Troponin:   Recent Labs  Lab 12/18/21 1424  TROPONINIHS 7     Chemistry Recent Labs  Lab 12/18/21 1424 12/19/21 0518 12/19/21 0948  NA 137 135 133*  K 3.4* 2.9* 3.2*  CL 90* 91* 90*  CO2 38* 35* 35*  GLUCOSE 114* 185* 143*  BUN 28* 29* 30*  CREATININE 1.40* 1.54* 1.55*   CALCIUM 8.9  8.4* 8.4*  MG  --  1.9  --   GFRNONAA 42* 37* 37*  ANIONGAP 9 9 8     Hematology Recent Labs  Lab 12/18/21 1232 12/19/21 0518  WBC 9.5 7.0  RBC 3.98 3.57*  HGB 12.0 10.9*  HCT 35.8* 31.9*  MCV 89.9 89.4  MCH 30.2 30.5  MCHC 33.5 34.2  RDW 13.0 13.2  PLT 301 242   BNP Recent Labs  Lab 12/18/21 1232  BNP 18.1     Radiology/Studies:  DG Chest 2 View  Result Date: 12/18/2021 CLINICAL DATA:  Shortness of breath EXAM: CHEST - 2 VIEW COMPARISON:  Chest x-ray 09/19/2021 FINDINGS: Heart is enlarged. Mediastinum appears stable. Mild calcified plaques in the aortic arch. Pulmonary vasculature is within normal limits. No focal consolidation identified. No pleural effusion or pneumothorax. IMPRESSION: Cardiomegaly with no acute process identified. Electronically Signed   By: Ofilia Neas M.D.   On: 12/18/2021 12:46     Assessment and Plan:   Chronic diastolic heart failure -Extremely difficult to evaluate volume status due to body habituated.  Labs neither helpful.  BNP low at 18.  Checks x-ray with cardiomegaly only. -Her dry weight felt 361 pounds during office visit in October. -Weight 371 pounds -Patient was given IV Lasix 80 mg x 1 yesterday.  Net I&O -870 cc.  Weight same.  However kidney function worsened from 1.4->>1.55.  Baseline creatinine 1.1-1.2. -Will give holiday on diuretic today and see what renal functions does. Its too late to do RHC today.  -Losartan held today.  Questionable if she got dose yesterday or not. -Last echocardiogram September 20, 2021 showed normal LV function at 65 to 70%, intermediate diastolic dysfunction. -There is a confusion about her metolazone dose.  Will need review at discharge.   2.  Acute on chronic kidney disease stage III -Baseline creatinine 1.1 -1.2 -Creatinine worsened from 1.4->>1.55 after lasix x 1 yesterday   3.  Hypokalemia -K 3.2 today -Supplement given  4.  Hypertension -Blood pressure soft low  Risk  Assessment/Risk Scores:   New York Heart Association (NYHA) Functional Class NYHA Class III  For questions or updates, please contact [facility] Please consult [email or web address] for contact info under    Jarrett Soho, [place]  12/19/2021 4:35 PM   Patient seen and examined and agree with Robbie Lis, PA as detailed above.   In brief, the patient is a 65 y.o. female with a hx of chronic diastolic heart failure, morbid obesity, hypertension, hyperlipidemia, diabetes mellitus, CKD stage III, anxiety/depression and GERD who presented to the ER with complaints of fluid retention, LE cramps and weight gain concerning for acute on chronic diastolic HF exacerbation for which Cardiology was consulted.  Patient recently called clinic due to concern for weight gain. She was unsure if she was taking torsemide 40mg  or 80mg  BID at that time but was told to take metolazone 5mg  for 3 days. She took one dose but developed LE cramping and continued to feel SOB prompting her to come to the ER.  In the ER, BNP 18 although patient is morbidly obese, Cr 1.4, K 3.4>2.9. She got lasix 80mg  IV+ K and was reportedly -767mL negative. Wt stable at 371lbs. After diuresis, patient Cr rose from 1.4>1.55 (baseline around 1.1-1.2).   Overall, volume status is very difficult as patient is morbidly obese. She has trace LE edema and chronic venous stasis changes. She is not hypoxic and CXR without significant edema.  Given rising creatinine with diuresis and unclear  output to metolazone and how much torsemide she has been taking, will hold lasix for today and trend renal function. If volume status continues to be difficult to discern, will need RHC for further work-up.  GEN: Morbidly obese, comfortable, sitting up in chair  Neck: JVD difficult to assess Cardiac: RRR, no murmurs, rubs, or gallops.  Respiratory: Clear to auscultation bilaterally. GI: Obese, soft MS: Trace edema, chronic venous stasis changes Neuro:   Nonfocal  Psych: Normal affect    Plan: -Hold diuresis today given worsening renal function with lasix, no overt pulmonary edema on CXR and does not appear grossly overloaded on exam although volume exam very difficult -May ultimately need RHC to better determine volume status -Holding ARB due to AKI, resume once able -Replete K as likely cause of muscle cramps  Gwyndolyn Kaufman, MD

## 2021-12-19 NOTE — Progress Notes (Signed)
Visit made to patients room to set her up on CPAP for night rest.  Patient states she don't wear one at home and don't want to wear here.  Patient advised if she changes her mind to have RN call RT.

## 2021-12-19 NOTE — Progress Notes (Signed)
[facility] PROGRESS NOTE    Progress Note  Maria [name]  BEE:[id number] DOB: 11/27/1956 DOA: 12/18/2021 PCP: Biagio Borg, MD     Brief Narrative:   Maria [name] is an 65 y.o. female past medical history significant for lymphedema, chronic diastolic heart failure, depression and anxiety, chronic kidney disease stage IIIa, diabetes mellitus, essential hypertension fatty liver sacral decubitus ulcer present on admission comes in for lower extremity edema and shortness of breath she relates she has had worsening edema for the past 3 to 4 weeks her torsemide had to be increased 2 weeks ago and metolazone was added.   Assessment/Plan:   Dyspnea and lower extremity edema: SARS-CoV-2 and influenza PCR negative, BNP of 18, chest x-ray showed no pulmonary infiltrates, on admission her creatinine was 1.4, she was continue on high-dose diuretic now her creatinine is 1.5, bicarb 38 chloride 190. She was also hypotensive so we will hold diuretics and ARB. Discontinue diuretic therapy. She has lower extremity edema but lungs are clear cannot appreciate any JVD. 2D echo could not assess her pulmonary pressures, but her right ventricle does not appear to be dilated, she does have grade 2 diastolic heart failure. Check urinary sodium. Will consult cardiology for possible right heart cath  Acute kidney injury on chronic kidney disease stage IIIa: With a baseline creatinine around 1.1 on admission 1.5 she was started on IV Lasix on admission. Will discontinue IV Lasix and ARB, as she is also hypotensive this morning Acute kidney injury is likely due to overdiuresis. Recheck basic metabolic panel in the morning.  Electrolyte imbalance hypokalemia/hypomagnesemia: Replete potassium and magnesium orally recheck them in the morning.  Chronic pain: Continue home dose of oxycodone.  Essential hypertension: Continue metoprolol, discontinue losartan and IV diuretics she is in acute kidney  injury likely due to overdiuresis.  Diabetes mellitus type 2: Fairly controlled check an A1c, continue sliding scale insulin.  Obstructive sleep apnea: CPAP.  RLS: Continue ropinirole   Stage II sacral decubitus ulcer present on admission: RN Pressure Injury Documentation: Pressure Injury 09/19/21 Sacrum Mid Stage 2 -  Partial thickness loss of dermis presenting as a shallow open injury with a red, pink wound bed without slough. (Active)  09/19/21 1923  Location: Sacrum  Location Orientation: Mid  Staging: Stage 2 -  Partial thickness loss of dermis presenting as a shallow open injury with a red, pink wound bed without slough.  Wound Description (Comments):   Present on Admission: Yes   Morbid obesity Estimated body mass index is 61.78 kg/m as calculated from the following:   Height as of this encounter: 5\' 5"  (1.651 m).   Weight as of this encounter: 168.4 kg.   DVT prophylaxis: lovenox Family Communication:none Status is: Observation  The patient will require care spanning > 2 midnights and should be moved to inpatient because: Acute kidney injury       Code Status:     Code Status Orders  (From admission, onward)           Start     Ordered   12/18/21 2112  Full code  Continuous        12/18/21 2112           Code Status History     Date Active Date Inactive Code Status Order ID Comments User Context   09/19/2021 1854 09/25/2021 1847 Full Code [id number]  Jonnie Finner, DO Inpatient   04/06/2021 0945 04/11/2021 1842 Full Code [id number]  Karmen Bongo, MD  ED   03/07/2021 0435 03/11/2021 1828 Full Code [id number]  Vernelle Emerald, MD ED   08/19/2014 1413 08/24/2014 2121 Full Code [id number]  Robbie Lis, MD Inpatient   08/17/2013 [phone] 08/19/2013 1729 Full Code [id number]  Berle Mull, MD Inpatient   06/28/2012 2328 07/05/2012 1655 Full Code [id number]  Theodis Blaze, MD ED   05/23/2012 2118 05/24/2012 1518 Full Code [id number]  Pamella Pert, MD ED          IV Access:   Peripheral IV   Procedures and diagnostic studies:   DG Chest 2 View  Result Date: 12/18/2021 CLINICAL DATA:  Shortness of breath EXAM: CHEST - 2 VIEW COMPARISON:  Chest x-ray 09/19/2021 FINDINGS: Heart is enlarged. Mediastinum appears stable. Mild calcified plaques in the aortic arch. Pulmonary vasculature is within normal limits. No focal consolidation identified. No pleural effusion or pneumothorax. IMPRESSION: Cardiomegaly with no acute process identified. Electronically Signed   By: Ofilia Neas M.D.   On: 12/18/2021 12:46     Medical Consultants:   None.   Subjective:    [name] relates she is very nervous and anxious relates her breathing is unchanged.  Objective:    Vitals:   12/18/21 2318 12/18/21 2345 12/19/21 0414 12/19/21 0415  BP: 108/74 (!) 131/56  (!) 104/55  Pulse: 74 75  85  Resp:  17  17  Temp:  98 F (36.7 C)  98.2 F (36.8 C)  TempSrc:  Oral  Oral  SpO2: 99% 100%  92%  Weight:   (!) 168.4 kg   Height:       SpO2: 92 %   Intake/Output Summary (Last 24 hours) at 12/19/2021 0829 Last data filed at 12/19/2021 0644 Gross per 24 hour  Intake 840 ml  Output 1550 ml  Net -710 ml   Filed Weights   12/18/21 2245 12/19/21 0414  Weight: (!) 168.5 kg (!) 168.4 kg    Exam: General exam: In no acute distress. Respiratory system: Good air movement and clear to auscultation. Cardiovascular system: S1 & S2 heard, RRR. No JVD. Gastrointestinal system: Abdomen is nondistended, soft and nontender.  Extremities: No pedal edema. Skin: No rashes, lesions or ulcers Psychiatry: Judgement and insight appear normal. Mood & affect appropriate.    Data Reviewed:    Labs: Basic Metabolic Panel: Recent Labs  Lab 12/18/21 1424 12/19/21 0518  NA 137 135  K 3.4* 2.9*  CL 90* 91*  CO2 38* 35*  GLUCOSE 114* 185*  BUN 28* 29*  CREATININE 1.40* 1.54*  CALCIUM 8.9 8.4*  MG  --  1.9   GFR Estimated Creatinine  Clearance: 59.2 mL/min (A) (by C-G formula based on SCr of 1.54 mg/dL (H)). Liver Function Tests: No results for input(s): AST, ALT, ALKPHOS, BILITOT, PROT, ALBUMIN in the last 168 hours. No results for input(s): LIPASE, AMYLASE in the last 168 hours. No results for input(s): AMMONIA in the last 168 hours. Coagulation profile No results for input(s): INR, PROTIME in the last 168 hours. COVID-19 Labs  No results for input(s): DDIMER, FERRITIN, LDH, CRP in the last 72 hours.  Lab Results  Component Value Date   SARSCOV2NAA NEGATIVE 12/18/2021   SARSCOV2NAA NEGATIVE 09/19/2021   [place] NEGATIVE 08/10/2021   [place] NEGATIVE 04/06/2021    CBC: Recent Labs  Lab 12/18/21 1232 12/19/21 0518  WBC 9.5 7.0  HGB 12.0 10.9*  HCT 35.8* 31.9*  MCV 89.9 89.4  PLT 301 242   Cardiac Enzymes: No results for input(s):  CKTOTAL, CKMB, CKMBINDEX, TROPONINI in the last 168 hours. BNP (last 3 results) No results for input(s): PROBNP in the last 8760 hours. CBG: Recent Labs  Lab 12/18/21 2326 12/19/21 0740  GLUCAP 120* 188*   D-Dimer: No results for input(s): DDIMER in the last 72 hours. Hgb A1c: No results for input(s): HGBA1C in the last 72 hours. Lipid Profile: No results for input(s): CHOL, HDL, LDLCALC, TRIG, CHOLHDL, LDLDIRECT in the last 72 hours. Thyroid function studies: No results for input(s): TSH, T4TOTAL, T3FREE, THYROIDAB in the last 72 hours.  Invalid input(s): FREET3 Anemia work up: No results for input(s): VITAMINB12, FOLATE, FERRITIN, TIBC, IRON, RETICCTPCT in the last 72 hours. Sepsis Labs: Recent Labs  Lab 12/18/21 1232 12/19/21 0518  WBC 9.5 7.0   Microbiology Recent Results (from the past 240 hour(s))  Resp Panel by RT-PCR (Flu A&B, Covid) Nasopharyngeal Swab     Status: None   Collection Time: 12/18/21  9:36 PM   Specimen: Nasopharyngeal Swab; Nasopharyngeal(NP) swabs in vial transport medium  Result Value Ref Range Status   SARS Coronavirus 2  by RT PCR NEGATIVE NEGATIVE Final    Comment: (NOTE) SARS-CoV-2 target nucleic acids are NOT DETECTED.  The SARS-CoV-2 RNA is generally detectable in upper respiratory specimens during the acute phase of infection. The lowest concentration of SARS-CoV-2 viral copies this assay can detect is 138 copies/mL. A negative result does not preclude SARS-Cov-2 infection and should not be used as the sole basis for treatment or other patient management decisions. A negative result may occur with  improper specimen collection/handling, submission of specimen other than nasopharyngeal swab, presence of viral mutation(s) within the areas targeted by this assay, and inadequate number of viral copies(<138 copies/mL). A negative result must be combined with clinical observations, patient history, and epidemiological information. The expected result is Negative.  Fact Sheet for Patients:  EntrepreneurPulse.com.au  Fact Sheet for Healthcare Providers:  IncredibleEmployment.be  This test is no t yet approved or cleared by the [place] FDA and  has been authorized for detection and/or diagnosis of SARS-CoV-2 by FDA under an Emergency Use Authorization (EUA). This EUA will remain  in effect (meaning this test can be used) for the duration of the COVID-19 declaration under Section 564(b)(1) of the Act, 21 U.S.C.section 360bbb-3(b)(1), unless the authorization is terminated  or revoked sooner.       Influenza A by PCR NEGATIVE NEGATIVE Final   Influenza B by PCR NEGATIVE NEGATIVE Final    Comment: (NOTE) The Xpert Xpress SARS-CoV-2/FLU/RSV plus assay is intended as an aid in the diagnosis of influenza from Nasopharyngeal swab specimens and should not be used as a sole basis for treatment. Nasal washings and aspirates are unacceptable for Xpert Xpress SARS-CoV-2/FLU/RSV testing.  Fact Sheet for Patients: EntrepreneurPulse.com.au  Fact  Sheet for Healthcare Providers: IncredibleEmployment.be  This test is not yet approved or cleared by the [place] FDA and has been authorized for detection and/or diagnosis of SARS-CoV-2 by FDA under an Emergency Use Authorization (EUA). This EUA will remain in effect (meaning this test can be used) for the duration of the COVID-19 declaration under Section 564(b)(1) of the Act, 21 U.S.C. section 360bbb-3(b)(1), unless the authorization is terminated or revoked.  Performed at [facility], [place] [address], [place], [place] [address]   MRSA Next Gen by PCR, Nasal     Status: None   Collection Time: 12/19/21 12:04 AM   Specimen: Nasal Mucosa; Nasal Swab  Result Value Ref Range Status  MRSA by PCR Next Gen NOT DETECTED NOT DETECTED Final    Comment: (NOTE) The GeneXpert MRSA Assay (FDA approved for NASAL specimens only), is one component of a comprehensive MRSA colonization surveillance program. It is not intended to diagnose MRSA infection nor to guide or monitor treatment for MRSA infections. Test performance is not FDA approved in patients less than 58 years old. Performed at [facility], [place] [address], [place], [place] [address]      Medications:    enoxaparin (LOVENOX) injection  40 mg Subcutaneous Q24H   furosemide  80 mg Intravenous BID   insulin aspart  0-15 Units Subcutaneous TID WC   losartan  50 mg Oral Daily   metoprolol tartrate  25 mg Oral BID   pantoprazole  40 mg Oral Daily   potassium chloride  40 mEq Oral Q4H   rOPINIRole  1 mg Oral TID   sodium chloride flush  3 mL Intravenous Q12H   Continuous Infusions:    LOS: 0 days   Charlynne Cousins  [facility]  12/19/2021, 8:29 AM

## 2021-12-19 NOTE — Progress Notes (Signed)
O2 sats dropped to 83% on RA while sleeping.  Increased to 95% when awakened.  Patient refusing both CPAP and oxygen via nasal cannula at this time.  Will continue to monitor.  Maria [name]

## 2021-12-19 NOTE — Progress Notes (Signed)
Mobility Specialist Progress Note    12/19/21 1517  Mobility  Activity Ambulated in room  Level of Assistance Minimal assist, patient does 75% or more  Assistive Device Cane  Distance Ambulated (ft) 10 ft  Mobility Out of bed to chair with meals  Mobility Response Tolerated fair  Mobility performed by Mobility specialist  Bed Position Chair  $Mobility charge 1 Mobility   Pt received in bed and agreeable to get to chair. Pt c/o legs feeling numb/weird when she stood. Left in chair with call bell in reach.   [facility] Mobility Specialist  M.S. Primary Phone: [phone] M.S. Secondary Phone: [phone]

## 2021-12-19 NOTE — Progress Notes (Signed)
Patient refused CPAP for the night  

## 2021-12-19 NOTE — Plan of Care (Signed)
°  Problem: Cardiac: Goal: Ability to achieve and maintain adequate cardiopulmonary perfusion will improve Outcome: Progressing   Problem: Clinical Measurements: Goal: Ability to maintain clinical measurements within normal limits will improve Outcome: Progressing Goal: Will remain free from infection Outcome: Progressing Goal: Diagnostic test results will improve Outcome: Progressing Goal: Respiratory complications will improve Outcome: Progressing Goal: Cardiovascular complication will be avoided Outcome: Progressing

## 2021-12-20 DIAGNOSIS — E669 Obesity, unspecified: Secondary | ICD-10-CM

## 2021-12-20 DIAGNOSIS — I5033 Acute on chronic diastolic (congestive) heart failure: Secondary | ICD-10-CM | POA: Diagnosis not present

## 2021-12-20 DIAGNOSIS — E1169 Type 2 diabetes mellitus with other specified complication: Secondary | ICD-10-CM | POA: Diagnosis not present

## 2021-12-20 DIAGNOSIS — N1831 Chronic kidney disease, stage 3a: Secondary | ICD-10-CM | POA: Diagnosis not present

## 2021-12-20 DIAGNOSIS — I5030 Unspecified diastolic (congestive) heart failure: Secondary | ICD-10-CM

## 2021-12-20 LAB — BASIC METABOLIC PANEL
Anion gap: 10 (ref 5–15)
BUN: 31 mg/dL — ABNORMAL HIGH (ref 8–23)
CO2: 34 mmol/L — ABNORMAL HIGH (ref 22–32)
Calcium: 8.5 mg/dL — ABNORMAL LOW (ref 8.9–10.3)
Chloride: 91 mmol/L — ABNORMAL LOW (ref 98–111)
Creatinine, Ser: 1.48 mg/dL — ABNORMAL HIGH (ref 0.44–1.00)
GFR, Estimated: 39 mL/min — ABNORMAL LOW (ref >60)
Glucose, Bld: 155 mg/dL — ABNORMAL HIGH (ref 70–99)
Potassium: 3.2 mmol/L — ABNORMAL LOW (ref 3.5–5.1)
Sodium: 135 mmol/L (ref 135–145)

## 2021-12-20 LAB — GLUCOSE, CAPILLARY
Glucose-Capillary: 152 mg/dL — ABNORMAL HIGH (ref 70–99)
Glucose-Capillary: 141 mg/dL — ABNORMAL HIGH (ref 70–99)
Glucose-Capillary: 136 mg/dL — ABNORMAL HIGH (ref 70–99)
Glucose-Capillary: 170 mg/dL — ABNORMAL HIGH (ref 70–99)

## 2021-12-20 MED ORDER — POTASSIUM CHLORIDE 20 MEQ PO PACK
40.0000 meq | PACK | Freq: Three times a day (TID) | ORAL | Status: DC
Start: 2021-12-20 — End: 2021-12-20

## 2021-12-20 MED ORDER — POTASSIUM CHLORIDE CRYS ER 20 MEQ PO TBCR
40.0000 meq | EXTENDED_RELEASE_TABLET | Freq: Once | ORAL | Status: AC
  Administered 2021-12-20: 40 meq via ORAL
  Filled 2021-12-20: qty 2

## 2021-12-20 NOTE — Progress Notes (Signed)
° ° °  Subjective:  Chronic fatigue and dyspnea   Objective:  Vitals:   12/19/21 1658 12/19/21 2139 12/20/21 0357 12/20/21 0811  BP: (!) 115/43 (!) 126/54 136/60 (!) 134/57  Pulse: 74 78 77 71  Resp: 18  20 20   Temp: 98.2 F (36.8 C) 98 F (36.7 C) 98.5 F (36.9 C) 99.1 F (37.3 C)  TempSrc: Oral Oral Oral Oral  SpO2: 90% 90% 91% 91%  Weight:   (!) 169.3 kg   Height:        Intake/Output from previous day:  Intake/Output Summary (Last 24 hours) at 12/20/2021 1031 Last data filed at 12/20/2021 0800 Gross per 24 hour  Intake 480 ml  Output 1400 ml  Net -920 ml    Physical Exam: Morbidly obese female Lungs clear Distant heart sounds Abdomen benign Plus one LE edema   Lab Results: Basic Metabolic Panel: Recent Labs    12/19/21 0518 12/19/21 0948 12/20/21 0236  NA 135 133* 135  K 2.9* 3.2* 3.2*  CL 91* 90* 91*  CO2 35* 35* 34*  GLUCOSE 185* 143* 155*  BUN 29* 30* 31*  CREATININE 1.54* 1.55* 1.48*  CALCIUM 8.4* 8.4* 8.5*  MG 1.9  --   --    Liver Function Tests: No results for input(s): AST, ALT, ALKPHOS, BILITOT, PROT, ALBUMIN in the last 72 hours. No results for input(s): LIPASE, AMYLASE in the last 72 hours. CBC: Recent Labs    12/18/21 1232 12/19/21 0518  WBC 9.5 7.0  HGB 12.0 10.9*  HCT 35.8* 31.9*  MCV 89.9 89.4  PLT 301 242   Cardiac Enzymes: No results for input(s): CKTOTAL, CKMB, CKMBINDEX, TROPONINI in the last 72 hours. BNP: Invalid input(s): POCBNP D-Dimer: No results for input(s): DDIMER in the last 72 hours. Hemoglobin A1C: Recent Labs    12/19/21 0948  HGBA1C 6.8*     Imaging: DG Chest 2 View  Result Date: 12/18/2021 CLINICAL DATA:  Shortness of breath EXAM: CHEST - 2 VIEW COMPARISON:  Chest x-ray 09/19/2021 FINDINGS: Heart is enlarged. Mediastinum appears stable. Mild calcified plaques in the aortic arch. Pulmonary vasculature is within normal limits. No focal consolidation identified. No pleural effusion or pneumothorax.  IMPRESSION: Cardiomegaly with no acute process identified. Electronically Signed   By: Ofilia Neas M.D.   On: 12/18/2021 12:46    Cardiac Studies:  ECG: SR rate 69 normal    Telemetry:  NSR 12/20/2021   Echo: 09/20/21 EF 65-70% normal RV grade 2 diastolic no valve dx  Medications:    enoxaparin (LOVENOX) injection  40 mg Subcutaneous Q24H   insulin aspart  0-15 Units Subcutaneous TID WC   metoprolol tartrate  25 mg Oral BID   pantoprazole  40 mg Oral Daily   polyethylene glycol  17 g Oral BID   rOPINIRole  1 mg Oral TID   sodium chloride flush  3 mL Intravenous Q12H      Assessment/Plan:   Diastolic CHF:  Primary issues seems to be morbid obesity Had a liter out Lasix held for elevated Cr improved this am 1.55->1.48 K 3.2 supplement BNP is normal and CXR with NAD Can resume home demedex dose 40 bid in am EF is normal and BP not elevated continue to hold losartan 50 mg at this time    Jenkins Rouge 12/20/2021, 10:31 AM

## 2021-12-20 NOTE — Progress Notes (Signed)
PT Cancellation Note  Patient Details Name: Maria [name] MRN: [id number] DOB: 1956/06/05   Cancelled Treatment:    Reason Eval/Treat Not Completed: Pain limiting ability to participate;Fatigue/lethargy limiting ability to participate.  Pt is in pain in knees and back, and tired from walking to BR and being up in chair.  Declined OOB again, retry at another time.   Ramond Dial 12/20/2021, 3:36 PM  Mee Hives, PT PhD Acute Rehab Dept. Number: [place]

## 2021-12-20 NOTE — Progress Notes (Signed)
[facility] PROGRESS NOTE    Progress Note  Maria [name]  DZH:[id number] DOB: 01-31-1956 DOA: 12/18/2021 PCP: Biagio Borg, MD     Brief Narrative:   Maria [name] is an 65 y.o. female past medical history significant for lymphedema, chronic diastolic heart failure, depression and anxiety, chronic kidney disease stage IIIa, diabetes mellitus, essential hypertension fatty liver sacral decubitus ulcer present on admission comes in for lower extremity edema and shortness of breath she relates she has had worsening edema for the past 3 to 4 weeks her torsemide had to be increased 2 weeks ago and metolazone was added.   Assessment/Plan:   Dyspnea and lower extremity edema: SARS-CoV-2 and influenza PCR negative, BNP of 18, chest x-ray showed no pulmonary infiltrates, on admission her creatinine was 1.4, she was continue on high-dose diuretic now her creatinine is 1.5, bicarb 38 chloride 190. Cardiology was consulted they recommended to hold ACE inhibitor and diuretic therapy on 12/19/2021. Now today and will receive challenge her with Demadex 40 mg p.o. twice daily Her urinary sodium was 17  Acute kidney injury on chronic kidney disease stage IIIa: With a baseline creatinine around 1.1 on admission 1.5 she was started on IV Lasix on admission. Lasix and ARB were held her creatinine improved. Cardiology recommended mended to challenge her with torsemide orally.  Electrolyte imbalance hypokalemia/hypomagnesemia: Replete potassium and magnesium orally recheck them in the morning.  Chronic pain: Continue home dose of oxycodone.  Essential hypertension: Continue metoprolol.  Diabetes mellitus type 2: Fairly controlled check an A1c, continue sliding scale insulin.  Obstructive sleep apnea: CPAP.  RLS: Continue ropinirole   Stage II sacral decubitus ulcer present on admission: RN Pressure Injury Documentation: Pressure Injury 09/19/21 Sacrum Mid Stage 2 -  Partial  thickness loss of dermis presenting as a shallow open injury with a red, pink wound bed without slough. (Active)  09/19/21 1923  Location: Sacrum  Location Orientation: Mid  Staging: Stage 2 -  Partial thickness loss of dermis presenting as a shallow open injury with a red, pink wound bed without slough.  Wound Description (Comments):   Present on Admission: Yes   Morbid obesity Estimated body mass index is 62.11 kg/m as calculated from the following:   Height as of this encounter: 5\' 5"  (1.651 m).   Weight as of this encounter: 169.3 kg.   DVT prophylaxis: lovenox Family Communication:none Status is: Observation  The patient will require care spanning > 2 midnights and should be moved to inpatient because: Acute kidney injury       Code Status:     Code Status Orders  (From admission, onward)           Start     Ordered   12/18/21 2112  Full code  Continuous        12/18/21 2112           Code Status History     Date Active Date Inactive Code Status Order ID Comments User Context   09/19/2021 1854 09/25/2021 1847 Full Code [id number]  Jonnie Finner, DO Inpatient   04/06/2021 0945 04/11/2021 1842 Full Code [id number]  Karmen Bongo, MD ED   03/07/2021 0435 03/11/2021 1828 Full Code [id number]  Vernelle Emerald, MD ED   08/19/2014 1413 08/24/2014 2121 Full Code [id number]  Robbie Lis, MD Inpatient   08/17/2013 0842 08/19/2013 1729 Full Code [id number]  Berle Mull, MD Inpatient   06/28/2012 2328 07/05/2012 1655 Full Code [id number]  Mart Piggs  M, MD ED   05/23/2012 2118 05/24/2012 1518 Full Code [id number]  Pamella Pert, MD ED         IV Access:   Peripheral IV   Procedures and diagnostic studies:   DG Chest 2 View  Result Date: 12/18/2021 CLINICAL DATA:  Shortness of breath EXAM: CHEST - 2 VIEW COMPARISON:  Chest x-ray 09/19/2021 FINDINGS: Heart is enlarged. Mediastinum appears stable. Mild calcified plaques in the aortic arch. Pulmonary vasculature is  within normal limits. No focal consolidation identified. No pleural effusion or pneumothorax. IMPRESSION: Cardiomegaly with no acute process identified. Electronically Signed   By: Ofilia Neas M.D.   On: 12/18/2021 12:46     Medical Consultants:   None.   Subjective:    Maria [name] still nervous feels better.  Objective:    Vitals:   12/19/21 2139 12/20/21 0357 12/20/21 0811 12/20/21 1140  BP: (!) 126/54 136/60 (!) 134/57 (!) 112/52  Pulse: 78 77 71 63  Resp:  20 20 20   Temp: 98 F (36.7 C) 98.5 F (36.9 C) 99.1 F (37.3 C) 99 F (37.2 C)  TempSrc: Oral Oral Oral Oral  SpO2: 90% 91% 91% 93%  Weight:  (!) 169.3 kg    Height:       SpO2: 93 %   Intake/Output Summary (Last 24 hours) at 12/20/2021 1202 Last data filed at 12/20/2021 1125 Gross per 24 hour  Intake 480 ml  Output 2300 ml  Net -1820 ml    Filed Weights   12/18/21 2245 12/19/21 0414 12/20/21 0357  Weight: (!) 168.5 kg (!) 168.4 kg (!) 169.3 kg    Exam: General exam: In no acute distress. Respiratory system: Good air movement and clear to auscultation. Cardiovascular system: S1 & S2 heard, RRR. No JVD. Gastrointestinal system: Abdomen is nondistended, soft and nontender.  Extremities: No pedal edema. Skin: No rashes, lesions or ulcers Psychiatry: Judgement and insight appear normal. Mood & affect appropriate.   Data Reviewed:    Labs: Basic Metabolic Panel: Recent Labs  Lab 12/18/21 1424 12/19/21 0518 12/19/21 0948 12/20/21 0236  NA 137 135 133* 135  K 3.4* 2.9* 3.2* 3.2*  CL 90* 91* 90* 91*  CO2 38* 35* 35* 34*  GLUCOSE 114* 185* 143* 155*  BUN 28* 29* 30* 31*  CREATININE 1.40* 1.54* 1.55* 1.48*  CALCIUM 8.9 8.4* 8.4* 8.5*  MG  --  1.9  --   --     GFR Estimated Creatinine Clearance: 61.8 mL/min (A) (by C-G formula based on SCr of 1.48 mg/dL (H)). Liver Function Tests: No results for input(s): AST, ALT, ALKPHOS, BILITOT, PROT, ALBUMIN in the last 168 hours. No  results for input(s): LIPASE, AMYLASE in the last 168 hours. No results for input(s): AMMONIA in the last 168 hours. Coagulation profile No results for input(s): INR, PROTIME in the last 168 hours. COVID-19 Labs  No results for input(s): DDIMER, FERRITIN, LDH, CRP in the last 72 hours.  Lab Results  Component Value Date   SARSCOV2NAA NEGATIVE 12/18/2021   SARSCOV2NAA NEGATIVE 09/19/2021   [place] NEGATIVE 08/10/2021   [place] NEGATIVE 04/06/2021    CBC: Recent Labs  Lab 12/18/21 1232 12/19/21 0518  WBC 9.5 7.0  HGB 12.0 10.9*  HCT 35.8* 31.9*  MCV 89.9 89.4  PLT 301 242    Cardiac Enzymes: No results for input(s): CKTOTAL, CKMB, CKMBINDEX, TROPONINI in the last 168 hours. BNP (last 3 results) No results for input(s): PROBNP in the last 8760 hours. CBG: Recent  Labs  Lab 12/19/21 1128 12/19/21 1558 12/19/21 2139 12/20/21 0728 12/20/21 1139  GLUCAP 137* 142* 172* 152* 141*    D-Dimer: No results for input(s): DDIMER in the last 72 hours. Hgb A1c: Recent Labs    12/19/21 0948  HGBA1C 6.8*   Lipid Profile: No results for input(s): CHOL, HDL, LDLCALC, TRIG, CHOLHDL, LDLDIRECT in the last 72 hours. Thyroid function studies: No results for input(s): TSH, T4TOTAL, T3FREE, THYROIDAB in the last 72 hours.  Invalid input(s): FREET3 Anemia work up: No results for input(s): VITAMINB12, FOLATE, FERRITIN, TIBC, IRON, RETICCTPCT in the last 72 hours. Sepsis Labs: Recent Labs  Lab 12/18/21 1232 12/19/21 0518  WBC 9.5 7.0    Microbiology Recent Results (from the past 240 hour(s))  Resp Panel by RT-PCR (Flu A&B, Covid) Nasopharyngeal Swab     Status: None   Collection Time: 12/18/21  9:36 PM   Specimen: Nasopharyngeal Swab; Nasopharyngeal(NP) swabs in vial transport medium  Result Value Ref Range Status   SARS Coronavirus 2 by RT PCR NEGATIVE NEGATIVE Final    Comment: (NOTE) SARS-CoV-2 target nucleic acids are NOT DETECTED.  The SARS-CoV-2 RNA is  generally detectable in upper respiratory specimens during the acute phase of infection. The lowest concentration of SARS-CoV-2 viral copies this assay can detect is 138 copies/mL. A negative result does not preclude SARS-Cov-2 infection and should not be used as the sole basis for treatment or other patient management decisions. A negative result may occur with  improper specimen collection/handling, submission of specimen other than nasopharyngeal swab, presence of viral mutation(s) within the areas targeted by this assay, and inadequate number of viral copies(<138 copies/mL). A negative result must be combined with clinical observations, patient history, and epidemiological information. The expected result is Negative.  Fact Sheet for Patients:  EntrepreneurPulse.com.au  Fact Sheet for Healthcare Providers:  IncredibleEmployment.be  This test is no t yet approved or cleared by the [place] FDA and  has been authorized for detection and/or diagnosis of SARS-CoV-2 by FDA under an Emergency Use Authorization (EUA). This EUA will remain  in effect (meaning this test can be used) for the duration of the COVID-19 declaration under Section 564(b)(1) of the Act, 21 U.S.C.section 360bbb-3(b)(1), unless the authorization is terminated  or revoked sooner.       Influenza A by PCR NEGATIVE NEGATIVE Final   Influenza B by PCR NEGATIVE NEGATIVE Final    Comment: (NOTE) The Xpert Xpress SARS-CoV-2/FLU/RSV plus assay is intended as an aid in the diagnosis of influenza from Nasopharyngeal swab specimens and should not be used as a sole basis for treatment. Nasal washings and aspirates are unacceptable for Xpert Xpress SARS-CoV-2/FLU/RSV testing.  Fact Sheet for Patients: EntrepreneurPulse.com.au  Fact Sheet for Healthcare Providers: IncredibleEmployment.be  This test is not yet approved or cleared by the [place] FDA and has been authorized for detection and/or diagnosis of SARS-CoV-2 by FDA under an Emergency Use Authorization (EUA). This EUA will remain in effect (meaning this test can be used) for the duration of the COVID-19 declaration under Section 564(b)(1) of the Act, 21 U.S.C. section 360bbb-3(b)(1), unless the authorization is terminated or revoked.  Performed at [facility], [place] [address], [place], [place] [address]   MRSA Next Gen by PCR, Nasal     Status: None   Collection Time: 12/19/21 12:04 AM   Specimen: Nasal Mucosa; Nasal Swab  Result Value Ref Range Status   MRSA by PCR Next Gen NOT DETECTED NOT DETECTED Final  Comment: (NOTE) The GeneXpert MRSA Assay (FDA approved for NASAL specimens only), is one component of a comprehensive MRSA colonization surveillance program. It is not intended to diagnose MRSA infection nor to guide or monitor treatment for MRSA infections. Test performance is not FDA approved in patients less than 5 years old. Performed at [facility], [place] [address], [place], [place] [address]      Medications:    enoxaparin (LOVENOX) injection  40 mg Subcutaneous Q24H   insulin aspart  0-15 Units Subcutaneous TID WC   metoprolol tartrate  25 mg Oral BID   pantoprazole  40 mg Oral Daily   polyethylene glycol  17 g Oral BID   potassium chloride  40 mEq Oral Once   rOPINIRole  1 mg Oral TID   sodium chloride flush  3 mL Intravenous Q12H   Continuous Infusions:    LOS: 1 day   Charlynne Cousins  [facility]  12/20/2021, 12:02 PM

## 2021-12-20 NOTE — Progress Notes (Signed)
Pt's third cpap refusal.  Order will be dc'd at this time but can be re-added if pt decides she wants it,  RT will monitor.

## 2021-12-21 DIAGNOSIS — G894 Chronic pain syndrome: Secondary | ICD-10-CM | POA: Diagnosis not present

## 2021-12-21 DIAGNOSIS — I890 Lymphedema, not elsewhere classified: Secondary | ICD-10-CM | POA: Diagnosis not present

## 2021-12-21 DIAGNOSIS — I5033 Acute on chronic diastolic (congestive) heart failure: Secondary | ICD-10-CM | POA: Diagnosis not present

## 2021-12-21 DIAGNOSIS — N1831 Chronic kidney disease, stage 3a: Secondary | ICD-10-CM | POA: Diagnosis not present

## 2021-12-21 LAB — BASIC METABOLIC PANEL
Anion gap: 8 (ref 5–15)
BUN: 21 mg/dL (ref 8–23)
CO2: 37 mmol/L — ABNORMAL HIGH (ref 22–32)
Calcium: 8.7 mg/dL — ABNORMAL LOW (ref 8.9–10.3)
Chloride: 94 mmol/L — ABNORMAL LOW (ref 98–111)
Creatinine, Ser: 1.18 mg/dL — ABNORMAL HIGH (ref 0.44–1.00)
GFR, Estimated: 52 mL/min — ABNORMAL LOW (ref >60)
Glucose, Bld: 135 mg/dL — ABNORMAL HIGH (ref 70–99)
Potassium: 4.2 mmol/L (ref 3.5–5.1)
Sodium: 139 mmol/L (ref 135–145)

## 2021-12-21 LAB — GLUCOSE, CAPILLARY: Glucose-Capillary: 160 mg/dL — ABNORMAL HIGH (ref 70–99)

## 2021-12-21 MED ORDER — TORSEMIDE 20 MG PO TABS: 40.0000 mg | ORAL_TABLET | Freq: Two times a day (BID) | ORAL | Status: DC

## 2021-12-21 NOTE — Progress Notes (Signed)
° ° °  Subjective:  Chronic fatigue and dyspnea   Objective:  Vitals:   12/20/21 1652 12/20/21 2014 12/21/21 0439 12/21/21 0820  BP: (!) 122/40 (!) 119/48 (!) 146/62 (!) 124/56  Pulse: 77 76 77 68  Resp: 20 18 20    Temp: 98.7 F (37.1 C) 98 F (36.7 C) 98.2 F (36.8 C)   TempSrc: Oral Oral Oral   SpO2: 92% 96% 96%   Weight:   (!) 165.8 kg   Height:        Intake/Output from previous day:  Intake/Output Summary (Last 24 hours) at 12/21/2021 0924 Last data filed at 12/21/2021 0800 Gross per 24 hour  Intake 1300 ml  Output 2025 ml  Net -725 ml    Physical Exam: Morbidly obese female Lungs clear Distant heart sounds Abdomen benign Plus one LE edema   Lab Results: Basic Metabolic Panel: Recent Labs    12/19/21 0518 12/19/21 0948 12/20/21 0236 12/21/21 0242  NA 135   < > 135 139  K 2.9*   < > 3.2* 4.2  CL 91*   < > 91* 94*  CO2 35*   < > 34* 37*  GLUCOSE 185*   < > 155* 135*  BUN 29*   < > 31* 21  CREATININE 1.54*   < > 1.48* 1.18*  CALCIUM 8.4*   < > 8.5* 8.7*  MG 1.9  --   --   --    < > = values in this interval not displayed.   Liver Function Tests: No results for input(s): AST, ALT, ALKPHOS, BILITOT, PROT, ALBUMIN in the last 72 hours. No results for input(s): LIPASE, AMYLASE in the last 72 hours. CBC: Recent Labs    12/18/21 1232 12/19/21 0518  WBC 9.5 7.0  HGB 12.0 10.9*  HCT 35.8* 31.9*  MCV 89.9 89.4  PLT 301 242   Cardiac Enzymes: No results for input(s): CKTOTAL, CKMB, CKMBINDEX, TROPONINI in the last 72 hours. BNP: Invalid input(s): POCBNP D-Dimer: No results for input(s): DDIMER in the last 72 hours. Hemoglobin A1C: Recent Labs    12/19/21 0948  HGBA1C 6.8*     Imaging: No results found.  Cardiac Studies:  ECG: SR rate 69 normal    Telemetry:  NSR 12/21/2021   Echo: 09/20/21 EF 65-70% normal RV grade 2 diastolic no valve dx  Medications:    enoxaparin (LOVENOX) injection  40 mg Subcutaneous Q24H   insulin aspart  0-15 Units  Subcutaneous TID WC   metoprolol tartrate  25 mg Oral BID   pantoprazole  40 mg Oral Daily   polyethylene glycol  17 g Oral BID   rOPINIRole  1 mg Oral TID   sodium chloride flush  3 mL Intravenous Q12H      Assessment/Plan:   Diastolic CHF:  Primary issues seems to be morbid obesity Had a liter out Lasix held for elevated Cr improved this am 1.55->1.48 -> 1.8 K 4.2 BNP is normal and CXR with NAD Can resume home demedex dose 40 bid  EF is normal and BP not elevated continue to hold losartan 50 mg at this time    Cardiology will sign off  Will arrange outpatient f/u with DR Shelda Altes 12/21/2021, 9:24 AM

## 2021-12-21 NOTE — Discharge Summary (Addendum)
Physician Discharge Summary  Maria [name] YKD:[id number] DOB: 1956/02/27 DOA: 12/18/2021  PCP: Biagio Borg, MD  Admit date: 12/18/2021 Discharge date: 12/21/2021  Admitted From: Home Disposition:  home  Recommendations for Outpatient Follow-up:  Follow up with Cardiologist in 1-2 weeks Please obtain BMP/CBC in one week   Home Health:No Equipment/Devices:none  Discharge Condition:Stable CODE STATUS:Full Diet recommendation: Heart Healthy    Brief/Interim Summary:  66 y.o. female past medical history significant for lymphedema, chronic diastolic heart failure, depression and anxiety, chronic kidney disease stage IIIa, diabetes mellitus, essential hypertension fatty liver sacral decubitus ulcer present on admission comes in for lower extremity edema and shortness of breath she relates she has had worsening edema for the past 3 to 4 weeks her torsemide had to be increased 2 weeks ago and metolazone was added.  She had similar presentation in October which improved with diuresis.  Discharge Diagnoses:  Principal Problem:   CHF exacerbation ([place]) Active Problems:   Anxiety state   Depression   Essential hypertension   Gastroesophageal reflux disease without esophagitis   Chronic pain syndrome   Acquired lymphedema   OSA (obstructive sleep apnea)   Diabetes mellitus type 2 in obese (HCC)   CKD (chronic kidney disease), stage IIIa (HCC)   Dyspnea  Acute diastolic heart failure: SARS-CoV-2 NP influenza PCR were negative, BNP of 18 chest x-ray showed no infiltrates creatinine was up at 1.4. Cardiology was consulted they recommended to hold ACE inhibitor and continue diuretic therapy. Creatinine improved. Recommended to continue Demadex 40 mg p.o. twice daily and follow-up with them as an outpatient.  Acute kidney injury on chronic kidney disease stage IIIa: With a baseline creatinine around 1.1 on admission 1.5 she was started on IV Lasix and ARB were held and her creatinine  returned to baseline.  Electrolyte imbalance hypokalemia/hypomagnesemia: Resolved.  Orally now resolved.  Chronic pain: Continue home dose of oxycodone.  Essential hypertension: Continue metoprolol and Demadex.  Diabetes mellitus type 2: Fairly controlled with an A1c of 6.8 she required minimal insulin she will go back on her home metformin as an outpatient.  Obstructive sleep apnea: CPAP at home.  Discharge Instructions  Discharge Instructions     Diet - low sodium heart healthy   Complete by: As directed    Increase activity slowly   Complete by: As directed       Allergies as of 12/21/2021       Reactions   Amoxicillin-pot Clavulanate Nausea And Vomiting   Projectile vomiting Did it involve swelling of the face/tongue/throat, SOB, or low BP? No Did it involve sudden or severe rash/hives, skin peeling, or any reaction on the inside of your mouth or nose? No Did you need to seek medical attention at a hospital or doctor's office? No When did it last happen?      20-30 years If all above answers are NO, may proceed with cephalosporin use.   Adhesive [tape] Other (See Comments)   Tears skin off - use paper tape    Codeine Other (See Comments)   hallucinations   Crestor [rosuvastatin Calcium]    Severe muscle cramps   Morphine And Related Nausea And Vomiting, Other (See Comments)   Migraine headaches   Vicodin [hydrocodone-acetaminophen] Other (See Comments)   hallucinations   Latex Rash        Medication List     STOP taking these medications    losartan 50 MG tablet Commonly known as: COZAAR   metolazone 5 MG tablet  Commonly known as: ZAROXOLYN       TAKE these medications    Accu-Chek Guide test strip Generic drug: glucose blood Use as instructed up to 4 times daily   Accu-Chek Guide w/Device Kit Use as directed What changed:  how much to take how to take this when to take this additional instructions   Accu-Chek Softclix Lancets  lancets Use as directed up to 4 times daily What changed:  how much to take how to take this when to take this additional instructions   acetaminophen 325 MG tablet Commonly known as: TYLENOL Take 650 mg by mouth every 6 (six) hours as needed for headache or fever (pain).   albuterol 108 (90 Base) MCG/ACT inhaler Commonly known as: ProAir HFA Inhale 2 puffs into the lungs every 6 (six) hours as needed for wheezing or shortness of breath.   ALLEGRA PO Take 180 mg by mouth daily as needed (allergies).   clonazePAM 1 MG tablet Commonly known as: KLONOPIN TAKE 1 TABLET BY MOUTH 2 TIMES A DAY AS NEEDED FOR ANXIETY What changed: See the new instructions.   cyclobenzaprine 10 MG tablet Commonly known as: FLEXERIL TAKE 1 TABLET BY MOUTH 2 TIMES A DAY AS NEEDED FOR MUSCLE SPASMS   Metamucil Fiber Chew Chew 3 tablets by mouth at bedtime. What changed: Another medication with the same name was removed. Continue taking this medication, and follow the directions you see here.   metFORMIN 500 MG tablet Commonly known as: GLUCOPHAGE Take 1 tablet (500 mg total) by mouth 2 (two) times daily with a meal.   metoprolol tartrate 25 MG tablet Commonly known as: LOPRESSOR Take 25 mg by mouth 2 (two) times daily.   Narcan 4 MG/0.1ML Liqd nasal spray kit Generic drug: naloxone Place 0.4 mg into the nose daily as needed (opioid overdose).   ondansetron 4 MG disintegrating tablet Commonly known as: Zofran ODT Take 1 tablet (4 mg total) by mouth every 4 (four) hours as needed for nausea or vomiting.   Oxycodone HCl 10 MG Tabs Take 1 tablet (10 mg total) 4 (four) times daily by mouth. Per [facility] What changed:  when to take this reasons to take this   pantoprazole 40 MG tablet Commonly known as: PROTONIX TAKE ONE TABLET BY MOUTH DAILY   Polyethyl Glycol-Propyl Glycol 0.4-0.3 % Soln Apply 1 drop to eye daily as needed (dry eyes).   polyethylene glycol powder 17 GM/SCOOP  powder Commonly known as: GLYCOLAX/MIRALAX Take 17 g by mouth daily as needed for mild constipation.   potassium chloride SA 20 MEQ tablet Commonly known as: KLOR-CON M Take 1 tablet (20 mEq total) by mouth 2 (two) times daily.   rOPINIRole 1 MG tablet Commonly known as: REQUIP Take 1 tablet (1 mg total) by mouth 3 (three) times daily.   torsemide 20 MG tablet Commonly known as: DEMADEX Take 2 tablets (40 mg total) by mouth 2 (two) times daily.        Allergies  Allergen Reactions   Amoxicillin-Pot Clavulanate Nausea And Vomiting    Projectile vomiting Did it involve swelling of the face/tongue/throat, SOB, or low BP? No Did it involve sudden or severe rash/hives, skin peeling, or any reaction on the inside of your mouth or nose? No Did you need to seek medical attention at a hospital or doctor's office? No When did it last happen?      20-30 years If all above answers are NO, may proceed with cephalosporin use.  Adhesive [Tape] Other (See Comments)    Tears skin off - use paper tape    Codeine Other (See Comments)    hallucinations   Crestor [Rosuvastatin Calcium]     Severe muscle cramps   Morphine And Related Nausea And Vomiting and Other (See Comments)    Migraine headaches   Vicodin [Hydrocodone-Acetaminophen] Other (See Comments)    hallucinations   Latex Rash    Consultations: Cardiology   Procedures/Studies: DG Chest 2 View  Result Date: 12/18/2021 CLINICAL DATA:  Shortness of breath EXAM: CHEST - 2 VIEW COMPARISON:  Chest x-ray 09/19/2021 FINDINGS: Heart is enlarged. Mediastinum appears stable. Mild calcified plaques in the aortic arch. Pulmonary vasculature is within normal limits. No focal consolidation identified. No pleural effusion or pneumothorax. IMPRESSION: Cardiomegaly with no acute process identified. Electronically Signed   By: Ofilia Neas M.D.   On: 12/18/2021 12:46   (Echo, Carotid, EGD, Colonoscopy, ERCP)    Subjective: No  complaints  Discharge Exam: Vitals:   12/21/21 0439 12/21/21 0820  BP: (!) 146/62 (!) 124/56  Pulse: 77 68  Resp: 20   Temp: 98.2 F (36.8 C)   SpO2: 96%    Vitals:   12/20/21 1652 12/20/21 2014 12/21/21 0439 12/21/21 0820  BP: (!) 122/40 (!) 119/48 (!) 146/62 (!) 124/56  Pulse: 77 76 77 68  Resp: '20 18 20   ' Temp: 98.7 F (37.1 C) 98 F (36.7 C) 98.2 F (36.8 C)   TempSrc: Oral Oral Oral   SpO2: 92% 96% 96%   Weight:   (!) 165.8 kg   Height:        General: Pt is alert, awake, not in acute distress Cardiovascular: RRR, S1/S2 +, no rubs, no gallops Respiratory: CTA bilaterally, no wheezing, no rhonchi Abdominal: Soft, NT, ND, bowel sounds + Extremities: no edema, no cyanosis    The results of significant diagnostics from this hospitalization (including imaging, microbiology, ancillary and laboratory) are listed below for reference.     Microbiology: Recent Results (from the past 240 hour(s))  Resp Panel by RT-PCR (Flu A&B, Covid) Nasopharyngeal Swab     Status: None   Collection Time: 12/18/21  9:36 PM   Specimen: Nasopharyngeal Swab; Nasopharyngeal(NP) swabs in vial transport medium  Result Value Ref Range Status   SARS Coronavirus 2 by RT PCR NEGATIVE NEGATIVE Final    Comment: (NOTE) SARS-CoV-2 target nucleic acids are NOT DETECTED.  The SARS-CoV-2 RNA is generally detectable in upper respiratory specimens during the acute phase of infection. The lowest concentration of SARS-CoV-2 viral copies this assay can detect is 138 copies/mL. A negative result does not preclude SARS-Cov-2 infection and should not be used as the sole basis for treatment or other patient management decisions. A negative result may occur with  improper specimen collection/handling, submission of specimen other than nasopharyngeal swab, presence of viral mutation(s) within the areas targeted by this assay, and inadequate number of viral copies(<138 copies/mL). A negative result must be  combined with clinical observations, patient history, and epidemiological information. The expected result is Negative.  Fact Sheet for Patients:  EntrepreneurPulse.com.au  Fact Sheet for Healthcare Providers:  IncredibleEmployment.be  This test is no t yet approved or cleared by the [place] FDA and  has been authorized for detection and/or diagnosis of SARS-CoV-2 by FDA under an Emergency Use Authorization (EUA). This EUA will remain  in effect (meaning this test can be used) for the duration of the COVID-19 declaration under Section 564(b)(1) of the Act, 21  U.S.C.section 360bbb-3(b)(1), unless the authorization is terminated  or revoked sooner.       Influenza A by PCR NEGATIVE NEGATIVE Final   Influenza B by PCR NEGATIVE NEGATIVE Final    Comment: (NOTE) The Xpert Xpress SARS-CoV-2/FLU/RSV plus assay is intended as an aid in the diagnosis of influenza from Nasopharyngeal swab specimens and should not be used as a sole basis for treatment. Nasal washings and aspirates are unacceptable for Xpert Xpress SARS-CoV-2/FLU/RSV testing.  Fact Sheet for Patients: EntrepreneurPulse.com.au  Fact Sheet for Healthcare Providers: IncredibleEmployment.be  This test is not yet approved or cleared by the [place] FDA and has been authorized for detection and/or diagnosis of SARS-CoV-2 by FDA under an Emergency Use Authorization (EUA). This EUA will remain in effect (meaning this test can be used) for the duration of the COVID-19 declaration under Section 564(b)(1) of the Act, 21 U.S.C. section 360bbb-3(b)(1), unless the authorization is terminated or revoked.  Performed at [facility], [place] [address] [place], [place] [address]   MRSA Next Gen by PCR, Nasal     Status: None   Collection Time: 12/19/21 12:04 AM   Specimen: Nasal Mucosa; Nasal Swab  Result Value Ref Range Status   MRSA by PCR Next  Gen NOT DETECTED NOT DETECTED Final    Comment: (NOTE) The GeneXpert MRSA Assay (FDA approved for NASAL specimens only), is one component of a comprehensive MRSA colonization surveillance program. It is not intended to diagnose MRSA infection nor to guide or monitor treatment for MRSA infections. Test performance is not FDA approved in patients less than 28 years old. Performed at [facility], [place] [address] [place], [place] [address]      Labs: BNP (last 3 results) Recent Labs    09/19/21 1228 10/16/21 2016 12/18/21 1232  BNP 34.1 32.0 89.3   Basic Metabolic Panel: Recent Labs  Lab 12/18/21 1424 12/19/21 0518 12/19/21 0948 12/20/21 0236 12/21/21 0242  NA 137 135 133* 135 139  K 3.4* 2.9* 3.2* 3.2* 4.2  CL 90* 91* 90* 91* 94*  CO2 38* 35* 35* 34* 37*  GLUCOSE 114* 185* 143* 155* 135*  BUN 28* 29* 30* 31* 21  CREATININE 1.40* 1.54* 1.55* 1.48* 1.18*  CALCIUM 8.9 8.4* 8.4* 8.5* 8.7*  MG  --  1.9  --   --   --    Liver Function Tests: No results for input(s): AST, ALT, ALKPHOS, BILITOT, PROT, ALBUMIN in the last 168 hours. No results for input(s): LIPASE, AMYLASE in the last 168 hours. No results for input(s): AMMONIA in the last 168 hours. CBC: Recent Labs  Lab 12/18/21 1232 12/19/21 0518  WBC 9.5 7.0  HGB 12.0 10.9*  HCT 35.8* 31.9*  MCV 89.9 89.4  PLT 301 242   Cardiac Enzymes: No results for input(s): CKTOTAL, CKMB, CKMBINDEX, TROPONINI in the last 168 hours. BNP: Invalid input(s): POCBNP CBG: Recent Labs  Lab 12/20/21 0728 12/20/21 1139 12/20/21 1630 12/20/21 2146 12/21/21 0801  GLUCAP 152* 141* 136* 170* 160*   D-Dimer No results for input(s): DDIMER in the last 72 hours. Hgb A1c Recent Labs    12/19/21 0948  HGBA1C 6.8*   Lipid Profile No results for input(s): CHOL, HDL, LDLCALC, TRIG, CHOLHDL, LDLDIRECT in the last 72 hours. Thyroid function studies No results for input(s): TSH, T4TOTAL, T3FREE, THYROIDAB in the last 72  hours.  Invalid input(s): FREET3 Anemia work up No results for input(s): VITAMINB12, FOLATE, FERRITIN, TIBC, IRON, RETICCTPCT in the last  72 hours. Urinalysis    Component Value Date/Time   COLORURINE YELLOW 09/19/2021 1400   APPEARANCEUR HAZY (A) 09/19/2021 1400   LABSPEC 1.005 09/19/2021 1400   PHURINE 7.0 09/19/2021 1400   GLUCOSEU NEGATIVE 09/19/2021 1400   GLUCOSEU NEGATIVE 07/07/2021 1621   HGBUR SMALL (A) 09/19/2021 1400   BILIRUBINUR NEGATIVE 09/19/2021 1400   KETONESUR NEGATIVE 09/19/2021 1400   PROTEINUR NEGATIVE 09/19/2021 1400   UROBILINOGEN 0.2 07/07/2021 1621   NITRITE NEGATIVE 09/19/2021 1400   LEUKOCYTESUR MODERATE (A) 09/19/2021 1400   Sepsis Labs Invalid input(s): PROCALCITONIN,  WBC,  LACTICIDVEN Microbiology Recent Results (from the past 240 hour(s))  Resp Panel by RT-PCR (Flu A&B, Covid) Nasopharyngeal Swab     Status: None   Collection Time: 12/18/21  9:36 PM   Specimen: Nasopharyngeal Swab; Nasopharyngeal(NP) swabs in vial transport medium  Result Value Ref Range Status   SARS Coronavirus 2 by RT PCR NEGATIVE NEGATIVE Final    Comment: (NOTE) SARS-CoV-2 target nucleic acids are NOT DETECTED.  The SARS-CoV-2 RNA is generally detectable in upper respiratory specimens during the acute phase of infection. The lowest concentration of SARS-CoV-2 viral copies this assay can detect is 138 copies/mL. A negative result does not preclude SARS-Cov-2 infection and should not be used as the sole basis for treatment or other patient management decisions. A negative result may occur with  improper specimen collection/handling, submission of specimen other than nasopharyngeal swab, presence of viral mutation(s) within the areas targeted by this assay, and inadequate number of viral copies(<138 copies/mL). A negative result must be combined with clinical observations, patient history, and epidemiological information. The expected result is Negative.  Fact Sheet for  Patients:  EntrepreneurPulse.com.au  Fact Sheet for Healthcare Providers:  IncredibleEmployment.be  This test is no t yet approved or cleared by the [place] FDA and  has been authorized for detection and/or diagnosis of SARS-CoV-2 by FDA under an Emergency Use Authorization (EUA). This EUA will remain  in effect (meaning this test can be used) for the duration of the COVID-19 declaration under Section 564(b)(1) of the Act, 21 U.S.C.section 360bbb-3(b)(1), unless the authorization is terminated  or revoked sooner.       Influenza A by PCR NEGATIVE NEGATIVE Final   Influenza B by PCR NEGATIVE NEGATIVE Final    Comment: (NOTE) The Xpert Xpress SARS-CoV-2/FLU/RSV plus assay is intended as an aid in the diagnosis of influenza from Nasopharyngeal swab specimens and should not be used as a sole basis for treatment. Nasal washings and aspirates are unacceptable for Xpert Xpress SARS-CoV-2/FLU/RSV testing.  Fact Sheet for Patients: EntrepreneurPulse.com.au  Fact Sheet for Healthcare Providers: IncredibleEmployment.be  This test is not yet approved or cleared by the [place] FDA and has been authorized for detection and/or diagnosis of SARS-CoV-2 by FDA under an Emergency Use Authorization (EUA). This EUA will remain in effect (meaning this test can be used) for the duration of the COVID-19 declaration under Section 564(b)(1) of the Act, 21 U.S.C. section 360bbb-3(b)(1), unless the authorization is terminated or revoked.  Performed at [facility], [place] [address] [place], [place] [address]   MRSA Next Gen by PCR, Nasal     Status: None   Collection Time: 12/19/21 12:04 AM   Specimen: Nasal Mucosa; Nasal Swab  Result Value Ref Range Status   MRSA by PCR Next Gen NOT DETECTED NOT DETECTED Final    Comment: (NOTE) The GeneXpert MRSA Assay (FDA approved for NASAL specimens only), is one  component of  a comprehensive MRSA colonization surveillance program. It is not intended to diagnose MRSA infection nor to guide or monitor treatment for MRSA infections. Test performance is not FDA approved in patients less than 69 years old. Performed at [facility], [place] [address], [place], [place] [address]     SIGNED:   Charlynne Cousins, MD  [facility] 12/21/2021, 10:26 AM Pager   If 7PM-7AM, please contact night-coverage [email or web address] Password [email or web address]

## 2021-12-21 NOTE — TOC Transition Note (Signed)
Transition of Care [facility]) - CM/SW Discharge Note   Patient Details  Name: Maria [name] MRN: [id number] Date of Birth: 03-04-1956  [name] CM/SW Contact:  Bartholomew Crews, RN Phone Number: [phone] 12/21/2021, 12:22 PM   Clinical Narrative:     Spoke with patient at the bedside. Patient expressed concerns about discharging home without having been tried on her torsemide stating she did not want to have to come back to the hospital. Patient stated that nursing was working on this with the provider.   Discussed home health PT recommendations. Patient stated that she is active with [facility] for nursing and PT - verified with Levada Dy at [facility]. PT recently discharged from services, but able to accept referral since recent hospitalization. Orders received from provider.   Patient uses [facility] transportation and [facility] transportation to get to medical appointment. Requesting transportation home at discharge. Ride arranged through [employer].   Patient is home alone. Uses cane. Demographics verified.   No further TOC needs identified.   Final next level of care: [place] Barriers to Discharge: No Barriers Identified   Patient Goals and CMS Choice Patient states their goals for this hospitalization and ongoing recovery are:: return home CMS Medicare.gov Compare Post Acute Care list provided to:: Patient Choice offered to / list presented to : Patient  Discharge Placement                       Discharge Plan and Services                DME Arranged: N/A DME Agency: NA       HH Arranged: RN, PT HH Agency: [place] Date [place]: 12/21/21 Time [place]: 1221 Representative spoke with at [place]: [place] ([place]) Interventions     Readmission Risk Interventions Readmission Risk Prevention Plan 04/11/2021  Transportation Screening Complete  PCP or Specialist Appt within  3-5 Days Complete  HRI or [place] Complete  Social Work Consult for [place] Planning/Counseling Complete  Palliative Care Screening Not Applicable  Medication Review [employer]) Complete  Some recent data might be hidden

## 2021-12-21 NOTE — Plan of Care (Signed)

## 2021-12-21 NOTE — Evaluation (Signed)
Physical Therapy Evaluation Patient Details Name: Maria [name] MRN: [id number] DOB: May 06, 1956 Today's Date: 12/21/2021  History of Present Illness  Pt adm 12/29 with dyspnea and LE edema. Pt with diastolic chf. PMH - lymphedema, chf, depression, anxiety, ckd, DM, HTN, fatty liver, chronic low back pain. PVD, morbid obesity  Clinical Impression  Pt presents to PT with decreased mobility due to decreased strength, decreased balance, and decreased functional activity tolerance. Pt has little support at home so will likely need HHPT at DC.         Recommendations for follow up therapy are one component of a multi-disciplinary discharge planning process, led by the attending physician.  Recommendations may be updated based on patient status, additional functional criteria and insurance authorization.  Follow Up Recommendations Home health PT    Assistance Recommended at Discharge Intermittent Supervision/Assistance  Functional Status Assessment Patient has had a recent decline in their functional status and/or demonstrates limited ability to make significant improvements in function in a reasonable and predictable amount of time  Equipment Recommendations  None recommended by PT    Recommendations for Other Services       Precautions / Restrictions Precautions Precautions: Fall      Mobility  Bed Mobility Overal bed mobility: Modified Independent                  Transfers Overall transfer level: Needs assistance Equipment used: Rollator (4 wheels) Transfers: Sit to/from Stand Sit to Stand: Min guard           General transfer comment: Assist for safety from low bed. Able to stand from commode using grab bar with supervision    Ambulation/Gait Ambulation/Gait assistance: Min guard Gait Distance (Feet): 75 Feet Assistive device: Rollator (4 wheels);None Gait Pattern/deviations: Step-through pattern;Decreased stride length;Trunk flexed;Wide base of support Gait  velocity: decr Gait velocity interpretation: <1.31 ft/sec, indicative of household ambulator   General Gait Details: Assist for safety. Pt steps in/out of bathroom without assistive device and uses wall or sink counter for reassurance  Stairs            Wheelchair Mobility    Modified Rankin (Stroke Patients Only)       Balance Overall balance assessment: Needs assistance Sitting-balance support: No upper extremity supported;Feet supported Sitting balance-Leahy Scale: Good     Standing balance support: No upper extremity supported;During functional activity Standing balance-Leahy Scale: Fair                               Pertinent Vitals/Pain Pain Assessment: Faces Faces Pain Scale: Hurts little more Pain Location: chronic Pain Descriptors / Indicators: Grimacing Pain Intervention(s): Limited activity within patient's tolerance;Repositioned    Home Living Family/patient expects to be discharged to:: Private residence Living Arrangements: Alone Available Help at Discharge: Friend(s) Type of Home: Apartment Home Access: Stairs to enter Entrance Stairs-Rails: Right Entrance Stairs-Number of Steps: 6 steps that are rather uneven, where some are normal steps and some more like a platform. from parking lot.   Home Layout: One level Home Equipment: Rollator (4 wheels);Cane - single point;BSC/3in1;Shower seat;Hand held shower head;Grab bars - tub/shower;Adaptive equipment      Prior Function Prior Level of Function : Independent/Modified Independent             Mobility Comments: Uses cane or rollator for ambulation       Hand Dominance   Dominant Hand: Right    Extremity/Trunk Assessment  Upper Extremity Assessment Upper Extremity Assessment: Overall WFL for tasks assessed    Lower Extremity Assessment Lower Extremity Assessment: Generalized weakness       Communication   Communication: No difficulties  Cognition Arousal/Alertness:  Awake/alert Behavior During Therapy: WFL for tasks assessed/performed Overall Cognitive Status: Within Functional Limits for tasks assessed                                 General Comments: Somewhat labile about fluid restriction and her medical condition        General Comments General comments (skin integrity, edema, etc.): VSS on RA    Exercises     Assessment/Plan    PT Assessment Patient needs continued PT services  PT Problem List Decreased strength;Decreased activity tolerance;Decreased balance;Decreased mobility;Obesity       PT Treatment Interventions DME instruction;Gait training;Functional mobility training;Therapeutic activities;Therapeutic exercise;Balance training;Patient/family education    PT Goals (Current goals can be found in the Care Plan section)  Acute Rehab PT Goals Patient Stated Goal: get back to walking better PT Goal Formulation: With patient Time For Goal Achievement: 01/04/22 Potential to Achieve Goals: Good    Frequency Min 3X/week   Barriers to discharge Decreased caregiver support lives alone    Co-evaluation               AM-PAC PT "6 Clicks" Mobility  Outcome Measure Help needed turning from your back to your side while in a flat bed without using bedrails?: None Help needed moving from lying on your back to sitting on the side of a flat bed without using bedrails?: None Help needed moving to and from a bed to a chair (including a wheelchair)?: A Little Help needed standing up from a chair using your arms (e.g., wheelchair or bedside chair)?: A Little Help needed to walk in hospital room?: A Little Help needed climbing 3-5 steps with a railing? : A Little 6 Click Score: 20    End of Session   Activity Tolerance: Patient tolerated treatment well Patient left: in chair;with call bell/phone within reach Nurse Communication: Mobility status PT Visit Diagnosis: Other abnormalities of gait and mobility  (R26.89);Muscle weakness (generalized) (M62.81)    Time: 3845-3646 PT Time Calculation (min) (ACUTE ONLY): 22 min   Charges:   PT Evaluation $PT Eval Moderate Complexity: [place] Pager [phone] Office [place] 12/21/2021, 9:09 AM

## 2021-12-23 ENCOUNTER — Telehealth: Payer: Self-pay

## 2021-12-23 NOTE — Telephone Encounter (Signed)
Transition Care Management Follow-up Telephone Call Date of discharge and from where: [place] 12-21-20 Dx: CHF exacerbation How have you been since you were released from the hospital? Feeling some better  Any questions or concerns? No  Items Reviewed: Did the pt receive and understand the discharge instructions provided? Yes  Medications obtained and verified? Yes  Other? No  Any new allergies since your discharge? No  Dietary orders reviewed? Yes Do you have support at home? Yes   Home Care and Equipment/Supplies: Were home health services ordered? yes If so, what is the name of the agency? Name unknown   Has the agency set up a time to come to the patient's home? no Were any new equipment or medical supplies ordered?  No What is the name of the medical supply agency? na Were you able to get the supplies/equipment? not applicable Do you have any questions related to the use of the equipment or supplies? No  Functional Questionnaire: (I = Independent and D = Dependent) ADLs: I  Bathing/Dressing- I  Meal Prep- I  Eating- I  Maintaining continence- I  Transferring/Ambulation- I  Managing Meds- I  Follow up appointments reviewed:  PCP Hospital f/u appt confirmed? Yes  Scheduled to see Dr [name] Reichmann  on 12-30-21  @ [facility] f/u appt confirmed? Yes  Scheduled to see Dr Percival Spanish on 12-26-21 @ 130pm. Are transportation arrangements needed? No  If their condition worsens, is the pt aware to call PCP or go to the Emergency Dept.? Yes Was the patient provided with contact information for the PCP's office or ED? Yes Was to pt encouraged to call back with questions or concerns? Yes

## 2021-12-24 ENCOUNTER — Telehealth: Payer: Self-pay | Admitting: Internal Medicine

## 2021-12-24 ENCOUNTER — Telehealth: Payer: Self-pay | Admitting: Licensed Clinical Social Worker

## 2021-12-24 DIAGNOSIS — I110 Hypertensive heart disease with heart failure: Secondary | ICD-10-CM | POA: Diagnosis not present

## 2021-12-24 DIAGNOSIS — I5033 Acute on chronic diastolic (congestive) heart failure: Secondary | ICD-10-CM | POA: Diagnosis not present

## 2021-12-24 DIAGNOSIS — I872 Venous insufficiency (chronic) (peripheral): Secondary | ICD-10-CM | POA: Diagnosis not present

## 2021-12-24 DIAGNOSIS — E1151 Type 2 diabetes mellitus with diabetic peripheral angiopathy without gangrene: Secondary | ICD-10-CM | POA: Diagnosis not present

## 2021-12-24 DIAGNOSIS — I890 Lymphedema, not elsewhere classified: Secondary | ICD-10-CM | POA: Diagnosis not present

## 2021-12-24 NOTE — Telephone Encounter (Signed)
Noted pt has an upcoming appt at our [facility] office.  I reached out to [employer] and pt had already arranged [facility] ride.  They share they will call and confirm/remind pt tomorrow 1/5.   Westley Hummer, MSW, [place]  [phone]- work cell phone (preferred) [phone]- desk phone

## 2021-12-24 NOTE — Telephone Encounter (Signed)
Maria [name] w/ [name] informing provider patient had [place] OT eval and does not require any additional visits

## 2021-12-24 NOTE — Telephone Encounter (Signed)
Ok noted, no further ot needed

## 2021-12-25 ENCOUNTER — Ambulatory Visit: Payer: Medicare Other | Admitting: *Deleted

## 2021-12-25 ENCOUNTER — Telehealth: Payer: Self-pay | Admitting: Internal Medicine

## 2021-12-25 DIAGNOSIS — E1169 Type 2 diabetes mellitus with other specified complication: Secondary | ICD-10-CM

## 2021-12-25 DIAGNOSIS — N1831 Chronic kidney disease, stage 3a: Secondary | ICD-10-CM

## 2021-12-25 DIAGNOSIS — I5032 Chronic diastolic (congestive) heart failure: Secondary | ICD-10-CM

## 2021-12-25 NOTE — Chronic Care Management (AMB) (Signed)
Chronic Care Management   CCM RN Visit Note  12/25/2021 Name: Maria [name] MRN: [id number] DOB: 1956/09/22  Subjective: Maria [name] Minus is a 66 y.o. year old female who is a primary care patient of Maria Borg, MD. The care management team was consulted for assistance with disease management and care coordination needs.    Engaged with patient by telephone for follow up visit in response to provider referral for case management and/or care coordination services.   Consent to Services:  The patient was given information about Chronic Care Management services, agreed to services, and gave verbal consent prior to initiation of services.  Please see initial visit note for detailed documentation.  Patient agreed to services and verbal consent obtained.   Assessment: Review of patient past medical history, allergies, medications, health status, including review of consultants reports, laboratory and other test data, was performed as part of comprehensive evaluation and provision of chronic care management services.  CCM Care Plan Allergies  Allergen Reactions   Amoxicillin-Pot Clavulanate Nausea And Vomiting    Projectile vomiting Did it involve swelling of the face/tongue/throat, SOB, or low BP? No Did it involve sudden or severe rash/hives, skin peeling, or any reaction on the inside of your mouth or nose? No Did you need to seek medical attention at a hospital or doctor's office? No When did it last happen?      20-30 years If all above answers are NO, may proceed with cephalosporin use.    Adhesive [Tape] Other (See Comments)    Tears skin off - use paper tape    Codeine Other (See Comments)    hallucinations   Crestor [Rosuvastatin Calcium]     Severe muscle cramps   Morphine And Related Nausea And Vomiting and Other (See Comments)    Migraine headaches   Vicodin [Hydrocodone-Acetaminophen] Other (See Comments)    hallucinations   Latex Rash   Outpatient Encounter  Medications as of 12/25/2021  Medication Sig Note   Accu-Chek Softclix Lancets lancets Use as directed up to 4 times daily (Patient taking differently: 1 each by Other route in the morning, at noon, in the evening, and at bedtime.)    acetaminophen (TYLENOL) 325 MG tablet Take 650 mg by mouth every 6 (six) hours as needed for headache or fever (pain).    albuterol (PROAIR HFA) 108 (90 Base) MCG/ACT inhaler Inhale 2 puffs into the lungs every 6 (six) hours as needed for wheezing or shortness of breath.    Blood Glucose Monitoring Suppl (ACCU-CHEK GUIDE) w/Device KIT Use as directed (Patient taking differently: 1 each by Other route as directed.)    clonazePAM (KLONOPIN) 1 MG tablet TAKE 1 TABLET BY MOUTH 2 TIMES A DAY AS NEEDED FOR ANXIETY (Patient taking differently: Take 1 mg by mouth 2 (two) times daily as needed for anxiety.) 12/18/2021: LF at [place] on 11/06/21. 30 Day Supply    cyclobenzaprine (FLEXERIL) 10 MG tablet TAKE 1 TABLET BY MOUTH 2 TIMES A DAY AS NEEDED FOR MUSCLE SPASMS    Fexofenadine HCl (ALLEGRA PO) Take 180 mg by mouth daily as needed (allergies).    glucose blood (ACCU-CHEK GUIDE) test strip Use as instructed up to 4 times daily    Metamucil Fiber CHEW Chew 3 tablets by mouth at bedtime.    metFORMIN (GLUCOPHAGE) 500 MG tablet Take 1 tablet (500 mg total) by mouth 2 (two) times daily with a meal.    metoprolol tartrate (LOPRESSOR) 25 MG tablet Take 25 mg  by mouth 2 (two) times daily. 12/18/2021: LF at [place] on 10/28/21. 90 Day Supply    NARCAN 4 MG/0.1ML LIQD nasal spray kit Place 0.4 mg into the nose daily as needed (opioid overdose).    ondansetron (ZOFRAN ODT) 4 MG disintegrating tablet Take 1 tablet (4 mg total) by mouth every 4 (four) hours as needed for nausea or vomiting.    Oxycodone HCl 10 MG TABS Take 1 tablet (10 mg total) 4 (four) times daily by mouth. Per [facility] (Patient taking differently: Take 10 mg by mouth every 4 (four) hours as  needed (pain). Per [facility]) 12/18/2021: LF at [place] on 12/08/21. 30 Day Supply    pantoprazole (PROTONIX) 40 MG tablet TAKE ONE TABLET BY MOUTH DAILY (Patient taking differently: Take 40 mg by mouth daily.) 12/18/2021: LF at [place] on 11/06/21. 90 Day Supply    Polyethyl Glycol-Propyl Glycol 0.4-0.3 % SOLN Apply 1 drop to eye daily as needed (dry eyes).    polyethylene glycol powder (GLYCOLAX/MIRALAX) 17 GM/SCOOP powder Take 17 g by mouth daily as needed for mild constipation.    potassium chloride SA (KLOR-CON) 20 MEQ tablet Take 1 tablet (20 mEq total) by mouth 2 (two) times daily.    rOPINIRole (REQUIP) 1 MG tablet Take 1 tablet (1 mg total) by mouth 3 (three) times daily. 12/18/2021: LF at [place] on 11/24/21. 30 Day Supply    torsemide (DEMADEX) 20 MG tablet Take 2 tablets (40 mg total) by mouth 2 (two) times daily. 12/18/2021: LF at [place] on 11/21/21. 45 Day Supply   [DISCONTINUED] DULoxetine (CYMBALTA) 60 MG capsule Take 60 mg by mouth daily.      No facility-administered encounter medications on file as of 12/25/2021.   Patient Active Problem List   Diagnosis Date Noted   Dyspnea 12/19/2021   CHF exacerbation ([place]) 12/18/2021   Leg swelling 11/20/2021   Bilateral knee pain 10/29/2021   Hypernatremia 09/24/2021   CKD (chronic kidney disease), stage IIIa ([place]) 09/24/2021   Sacral decubitus ulcer, stage II ([place]) 09/20/2021   Acute on chronic diastolic CHF (congestive heart failure) ([place]) 09/19/2021   Nail disorder 07/07/2021   Pre-ulcerative corn or callous 07/07/2021   Rash 35/70/1779   Diastolic congestive heart failure ([place]) 03/17/2021   Diabetes mellitus type 2 in obese ([place]) 03/11/2021   Vitamin D deficiency 01/01/2021   Medial epicondylitis 12/31/2020   Aortic atherosclerosis ([place]) 39/02/91   Umbilical hernia 33/00/7622   Constipation 08/05/2020   Arthritis 06/12/2020   Hoarseness 05/01/2020   Vitamin B 12 deficiency 01/31/2020    Rosacea 01/31/2020   Neck swelling 01/31/2020   History of colonic polyps    Benign neoplasm of [place]    Nausea 08/31/2019   Throat pain 07/26/2019   HLD (hyperlipidemia) 05/24/2019   Leg cramps 05/24/2019   Lump in neck 05/24/2019   Left thyroid nodule 01/25/2019   Jerking movements of extremities 07/20/2018   Balance disorder 07/20/2018   Right knee pain 03/21/2018   OSA (obstructive sleep apnea) 03/21/2018   Oxygen desaturation 03/21/2018   Urinary symptom or sign 03/21/2018   Acquired lymphedema 01/25/2018   Gait disorder 01/25/2018   Peripheral edema 01/16/2018   SOB (shortness of breath) 01/15/2018   Dysphonia 12/07/2017   Encounter for well adult exam with abnormal findings 11/08/2017   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   Mccann polyp 08/11/2016   Abdominal pain, epigastric    Dysphagia    [place] cancer screening  ACE-inhibitor cough 12/05/2015   Restless legs syndrome 10/08/2015   Venous stasis dermatitis of both lower extremities 04/26/2015   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/17/2014   AR (allergic rhinitis) 10/17/2014   Fatty liver 10/17/2014   Chronic pain syndrome 09/19/2014   Post-traumatic osteoarthritis of both knees 09/19/2014   Spinal stenosis 09/19/2014   Depression, major, single episode, complete remission ([place]) 09/19/2014   Narcotic dependence ([place]) 09/19/2014   Spondylosis of 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 03/09/2012   Lumbar disc disease 03/09/2012   Chronic pain 03/09/2012   Morbid obesity ([place]) 06/17/2011   Chronic low back pain 12/28/2008   Anxiety state 12/06/2007   Depression 12/06/2007   Essential hypertension 12/06/2007   Gastroesophageal reflux disease without esophagitis 12/06/2007   LOW BACK PAIN 12/06/2007   Conditions to be addressed/monitored:  CHF, CKD III and DMII  Care Plan : RN Care Manager Plan of Care  Updates made by [name] Royalty, RN since 12/25/2021 12:00 AM     Problem: Chronic Disease  Management Needs   Priority: Medium     Long-Range Goal: Development of plan of care for long term chronic disease management   Start Date: 08/26/2021  Expected End Date: 08/26/2022  Priority: Medium  Note:   Current Barriers:  Chronic Disease Management support and education needs related to CHF and DMII Fragile state of health, multiple progressing chronic health conditions Recent in-patient hospitalizations x 2 in Spring 2022 for CHF exacerbation/ weight gain at home, shortness of breath Hospitalization: September 30- September 25, 2021- CHF exacerbation; discharged with home health services in place;  Hospitalization December 18, 2021- December 21, 2021 for CHF exacerbation with shortness of breath: this is patient's 4th inpatient hospitalization in one year for CHF exacerbation  RNCM Clinical Goal(s):  Patient will demonstrate ongoing health management independence for CHF and DMII  through collaboration with RN Care manager, provider, and care team.   Interventions: 1:1 collaboration with primary care provider regarding development and update of comprehensive plan of care as evidenced by provider attestation and co-signature Inter-disciplinary care team collaboration (see longitudinal plan of care) Evaluation of current treatment plan related to  self management and patient's adherence to plan as established by provider Chart reviewed including relevant office notes, upcoming scheduled appointments, and lab results  Heart Failure Interventions:  (Status: Goal on Track (progressing): YES.) Discussed importance of daily weight and advised patient to weigh and record daily Reviewed role of diuretics in prevention of fluid overload and management of heart failure Discussed the importance of keeping all appointments with provider Confirmed patient has plans to attend upcoming scheduled provider appointments post-hospital discharge: 12/26/21- cardiology, Dr. Percival Spanish; 12/30/21- Dr. [name] Reichmann; confirmed  transportation through [facility] transportation services in place/ scheduled Reviewed recent hospitalization: confirmed patient has good understanding of post-hospital discharge instructions Discussed recent diagnosis of CKD- patient reports ongoing "worsening" kidney function: plans to discuss options for medications therapy in light of CKD diagnosis with cardiologist tomorrow Confirmed not taking metolazone; continues taking Torsemide as prescribed, 40 mg po BID Confirmed home health services continue through [facility]: reports PT/ OT/ Nursing active: encouraged her ongoing participation with all disciplines Reviewed with patient her recent daily weights at home post recent hospital discharge- reports weight consistently between 362- 365 lbs; reports weight today at "365 lbs:"  this is roughly patient's established baseline Confirmed no concerns with breathing status; continues to report "same swelling" in ankles- per baseline; reports baseline shortness of breath with activity Confirms continues following low salt,  heart healthy, carb-modified, low sugar diet Reinforced previously provided education around signs/ symptoms CHF yellow zone, importance of daily assessments at home, action plan for yellow CHF zone: patient remains able to verbalize all with minimal prompting-- however, she will continue to benefit from ongoing support and reinforcement  Diabetes:  (Status: Goal on Track (progressing): YES.) Lab Results  Component Value Date   HGBA1C 6.8 (H) 12/19/2021  Reviewed prescribed diet with patient low carb/ low sugar; heart healthy, low salt, fluid restricted- <48 ounces per day; confirms patient following diet recommendations; Counseled on importance of regular laboratory monitoring as prescribed;        Discussed plans with patient for ongoing care management follow up and provided patient with direct contact information for care management team;      Review of patient status, including  review of consultants reports, relevant laboratory and other test results, and medications completed;       no changes/ unmet concerns identified Reviewed recent blood sugars at home: monitoring daily for now due to increased A1-C/ blood sugars while hospitalized: reports recent fasting ranges at home consistently between 96- 110; consistent post-prandial ranges between 120-140  Patient Goals/Self-Care Activities: As evidenced by review of EHR, collaboration with care team, and patient reporting during CCM RN CM outreach,  Patient [place] will: Patient [place] will: Take medications as prescribed Attend all scheduled provider appointments Call pharmacy for medication refills Call provider office for new concerns or questions Keep up the great work monitoring and writing down on paper daily weights at home: today you reported a weight of 365 lbs Continue to follow prescribed diet: low carb/ low sugar; heart healthy/ low salt; and will follow fluid restrictions of 48- 64 ounces per day Continue to talk to cardiology provider about the concerns you are having with your fluid pill not pulling off fluid effectively, causing you to have to go to the hospital frequently Watch daily for increased feeling of shortness of breath, swelling in feet, ankles and legs every day: if you have an increased amount of shortness of breath or swelling, make a note of it and let your cardiology provider know right away if it persists or gets worse Continue to keep legs up while sitting and wear compression stockings during daytime hours Stay as active as possible without over-doing: rest if you start to feel tired or "winded"/ short of breath Continue to monitor and write down on paper daily fasting blood sugars several times each week Continue to work with home health team: RN once/ week; PT and OT Continue your efforts to prevent falls- use your walker when you are walking            Plan: Telephone follow  up appointment with care management team member scheduled for:  Monday January 05, 2022 The patient has been provided with contact information for the care management team and has been advised to call with any health related questions or concerns  [name], RN, BSN, [place] [phone]: direct office

## 2021-12-25 NOTE — Progress Notes (Signed)
Cardiology Office Note   Date:  12/26/2021   ID:  Arizbeth Cawthorn Gallien, DOB 14-Mar-1956, MRN [id number]  PCP:  Biagio Borg, MD  Cardiologist:   Minus Breeding, MD  Chief Complaint  Patient presents with   Shortness of Breath     History of Present Illness: Maria [name] is a 66 y.o. female who presents for follow up of acute on chronic diastolic dysfunction. She was in the hospital in late April with abdominal pain.  We saw her for acute on chronic diastolic dysfunction.  She had some mild acute on chronic renal insufficiency.   We stopped her metolazone and switched Lasix to torsemide.  She was in the ED on 5/12 because she thought that she was volume overloaded and she had gained some weight.  However, BNP and CXR and exam did not suggest acute volume overload.   Her last echocardiogram 03/08/21 showed EF 60-65%, G1DD, no RWMA, elevated LVEDP, and no significant valvular abnormalities. Does not appear to have had prior ischemic testing though does have evidence of prior coronary artery calcifications (specifically LAD) noted on prior CTA Chest in 2020.   She just discharged from the hospital on Jan 1 after CHF exacerbation.  She was treated with IV diuresis.  She had a creat of 1.8 at peak.  This went up from 1.55.  He had her IV Lasix.  She was sent home on her previous dose of Demadex 40 mg twice daily.  She was continued on her losartan.  I reviewed these records for this visit.  She thinks her breathing is about the same since she was discharged.  Her weight may be down a little bit.  She is having some cramping in her feet.  She still has some chronic dyspnea and when she came back she was breathless but her oxygen saturation was 96%.  She gets around slowly with a walker.  Currently her BP is actually.  None of this is near her previous lowest weight recently of 356.   Past Medical History:  Diagnosis Date   Acute lymphadenitis 2011   ALLERGIC RHINITIS 08/10/2007   ANXIETY 12/06/2007    Atherosclerotic peripheral vascular disease ([place]) 06/13/2013   Aorta on CT June 2014   Cellulitis 2011   Cervical disc disease 03/09/2012   CHF (congestive heart failure) ([place])    Chronic pain 03/09/2012   DEPRESSION 12/06/2007   Diabetes ([place])    DIVERTICULOSIS, [place] 12/06/2007   GERD 12/06/2007   Hepatitis    age 21 hepatitis A   HLD (hyperlipidemia) 05/24/2019   HYPERTENSION 12/06/2007   Lumbar disc disease 03/09/2012   Mesenteric adenitis    MRSA 2006   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 2010   [place]  may 24th 2010   COLONOSCOPY WITH PROPOFOL N/A 07/10/2016   Procedure: COLONOSCOPY WITH PROPOFOL;  Surgeon: Manus Gunning, MD;  Location: [facility];  Service: Gastroenterology;  Laterality: N/A;   COLONOSCOPY WITH PROPOFOL N/A 09/26/2019   Procedure: COLONOSCOPY WITH PROPOFOL;  Surgeon: Yetta Flock, MD;  Location: [facility];  Service: Gastroenterology;  Laterality: N/A;   ESOPHAGOGASTRODUODENOSCOPY (EGD) WITH PROPOFOL N/A 07/10/2016   Procedure: ESOPHAGOGASTRODUODENOSCOPY (EGD) WITH PROPOFOL;  Surgeon: Manus Gunning, MD;  Location: [facility];  Service: Gastroenterology;  Laterality: N/A;   POLYPECTOMY  09/26/2019   Procedure: POLYPECTOMY;  Surgeon: Yetta Flock, MD;  Location: [facility];  Service: Gastroenterology;;   s/p ovary cyst     s/p right knee arthroscopy     Dr. Mardelle Matte ortho     Current Outpatient Medications  Medication Sig Dispense Refill   Accu-Chek Softclix Lancets lancets Use as directed up to 4 times daily (Patient taking differently: 1 each by Other route in the morning, at noon, in the evening, and at bedtime.) 100 each 5   acetaminophen (TYLENOL) 325 MG tablet Take 650 mg by mouth every 6  (six) hours as needed for headache or fever (pain).     albuterol (PROAIR HFA) 108 (90 Base) MCG/ACT inhaler Inhale 2 puffs into the lungs every 6 (six) hours as needed for wheezing or shortness of breath. 1 each 0   Blood Glucose Monitoring Suppl (ACCU-CHEK GUIDE) w/Device KIT Use as directed (Patient taking differently: 1 each by Other route as directed.) 1 kit 0   clonazePAM (KLONOPIN) 1 MG tablet TAKE 1 TABLET BY MOUTH 2 TIMES A DAY AS NEEDED FOR ANXIETY (Patient taking differently: Take 1 mg by mouth 2 (two) times daily as needed for anxiety.) 60 tablet 5   cyclobenzaprine (FLEXERIL) 10 MG tablet TAKE 1 TABLET BY MOUTH 2 TIMES A DAY AS NEEDED FOR MUSCLE SPASMS 180 tablet 1   Fexofenadine HCl (ALLEGRA PO) Take 180 mg by mouth daily as needed (allergies).     glucose blood (ACCU-CHEK GUIDE) test strip Use as instructed up to 4 times daily 100 each 12   Metamucil Fiber CHEW Chew 3 tablets by mouth at bedtime.     metFORMIN (GLUCOPHAGE) 500 MG tablet Take 1 tablet (500 mg total) by mouth 2 (two) times daily with a meal. 180 tablet 2   metoprolol tartrate (LOPRESSOR) 25 MG tablet Take 25 mg by mouth 2 (two) times daily.     NARCAN 4 MG/0.1ML LIQD nasal spray kit Place 0.4 mg into the nose daily as needed (opioid overdose).     ondansetron (ZOFRAN ODT) 4 MG disintegrating tablet Take 1 tablet (4 mg total) by mouth every 4 (four) hours as needed for nausea or vomiting. 20 tablet 0   Oxycodone HCl 10 MG TABS Take 1 tablet (10 mg total) 4 (four) times daily by mouth. Per [facility] (Patient taking differently: Take 10 mg by mouth every 4 (four) hours as needed (pain). Per [facility]) 30 tablet 0   pantoprazole (PROTONIX) 40 MG tablet TAKE ONE TABLET BY MOUTH DAILY (Patient taking differently: Take 40 mg by mouth daily.) 90 tablet 2   Polyethyl Glycol-Propyl Glycol 0.4-0.3 % SOLN Apply 1 drop to eye daily as needed (dry eyes).     polyethylene glycol powder (GLYCOLAX/MIRALAX) 17  GM/SCOOP powder Take 17 g by mouth daily as needed for mild constipation. 510 g 0   rOPINIRole (REQUIP) 1 MG tablet Take 1 tablet (1 mg total) by mouth 3 (three) times daily. 90 tablet 1   torsemide (DEMADEX) 20 MG tablet Take 2 tablets (40 mg total) by mouth 2 (two) times daily. 180 tablet 1   potassium chloride SA (KLOR-CON) 20 MEQ tablet Take 1 tablet (20 mEq total) by mouth 2 (two) times daily. 180 tablet 3   No current facility-administered medications for this visit.    Allergies:   Amoxicillin-pot clavulanate, Adhesive [tape], Codeine, Crestor [rosuvastatin calcium], Morphine and related, Vicodin [hydrocodone-acetaminophen], and  Latex    ROS:  Please see the history of present illness.   Otherwise, review of systems are positive for none.   All other systems are reviewed and negative.    PHYSICAL EXAM: VS:  BP 120/76 (BP Location: Left Arm, Patient Position: Sitting, Cuff Size: Normal)    Pulse 81    Resp 20    Ht '5\' 3"'  (1.6 m)    Wt (!) 365 lb 12.8 oz (165.9 kg)    SpO2 96%    BMI 64.80 kg/m  , BMI Body mass index is 64.8 kg/m. GEN:  No distress NECK:  No jugular venous distention at 90 degrees, waveform within normal limits, carotid upstroke brisk and symmetric, no bruits, no thyromegaly LYMPHATICS:  No cervical adenopathy LUNGS:  Clear to auscultation bilaterally BACK:  No CVA tenderness CHEST:  Unremarkable HEART:  S1 and S2 within normal limits, no S3, no S4, no clicks, no rubs, no murmurs ABD:  Positive bowel sounds normal in frequency in pitch, no bruits, no rebound, no guarding, unable to assess midline mass or bruit with the patient seated. EXT:  2 plus pulses throughout, moderate edema, no cyanosis no clubbing SKIN:  No rashes no nodules NEURO:  Cranial nerves II through XII grossly intact, motor grossly intact throughout PSYCH:  Cognitively intact, oriented to person place and time    EKG:  EKG is not ordered today.   Recent Labs: 12/31/2020: TSH  1.45 10/16/2021: ALT 11 12/18/2021: B Natriuretic Peptide 18.1 12/19/2021: Hemoglobin 10.9; Magnesium 1.9; Platelets 242 12/21/2021: BUN 21; Creatinine, Ser 1.18; Potassium 4.2; Sodium 139    Lipid Panel    Component Value Date/Time   CHOL 147 07/07/2021 1510   TRIG 140.0 07/07/2021 1510   TRIG 126 12/31/2006 0847   HDL 43.30 07/07/2021 1510   CHOLHDL 3 07/07/2021 1510   VLDL 28.0 07/07/2021 1510   LDLCALC 76 07/07/2021 1510   LDLDIRECT 127.0 01/25/2019 1423      Wt Readings from Last 3 Encounters:  12/26/21 (!) 365 lb 12.8 oz (165.9 kg)  12/21/21 (!) 365 lb 9.6 oz (165.8 kg)  11/28/21 (!) 363 lb 6.4 oz (164.8 kg)      Other studies Reviewed: Additional studies/ records that were reviewed today include: Hospital records Review of the above records demonstrates:  Please see elsewhere in the note.     ASSESSMENT AND PLAN:   Acute on chronic diastolic CHF:     Today I will check a basic metabolic profile.  I am going to keep her on the 40 mg Demadex twice daily.  She had previously been on 80 mg twice daily but was discharged on the lower dose.  If her creatinine allows I probably would go to 80 mg in the morning and 40 mg 6 hours later.  We again talked about salt and fluid restriction.  Essential hypertension  :   This is being managed in the context of treating his CHF   Type 2 diabetes : A1c was 6.1.  This is managed by Dr. Wynetta Emery.    Morbid obesity: I sent a message to Dr. [name] Reichmann today.  He will be seeing her in a couple of days.  I wonder if she would be a reasonable candidate for semaglutide.  She needs weight loss which is not going to be achievable I think without medical therapy.    Current medicines are reviewed at length with the patient today.  The patient does not have concerns regarding medicines.  The  following changes have been made:  None Labs/ tests ordered today include:    Orders Placed This Encounter  Procedures   Basic metabolic panel     Disposition:   FU with APP in about six weeks.   Signed, Minus Breeding, MD  12/26/2021 1:27 PM    [facility]

## 2021-12-25 NOTE — Telephone Encounter (Signed)
Left message for patient to call back to schedule Medicare Annual Wellness Visit   Last AWV  09/20/20  Please schedule at anytime with [facility] if patient calls the office back.    40 Minutes appointment   Any questions, please call me at [phone]

## 2021-12-25 NOTE — Patient Instructions (Addendum)
Visit [name], thank you for taking time to talk with me today. Please don't hesitate to contact me if I can be of assistance to you before our next scheduled telephone appointment.  Below are the goals we discussed today:  Patient Self-Care Activities: [name] will: Take medications as prescribed Attend all scheduled provider appointments Call pharmacy for medication refills Call provider office for new concerns or questions Keep up the great work monitoring and writing down on paper daily weights at home: today you reported a weight of 365 lbs Continue to follow prescribed diet: low carb/ low sugar; heart healthy/ low salt; and will follow fluid restrictions of 48- 64 ounces per day Continue to talk to cardiology provider about the concerns you are having with your fluid pill not pulling off fluid effectively, causing you to have to go to the hospital frequently Watch daily for increased feeling of shortness of breath, swelling in feet, ankles and legs every day: if you have an increased amount of shortness of breath or swelling, make a note of it and let your cardiology provider know right away if it persists or gets worse Continue to keep legs up while sitting and wear compression stockings during daytime hours Stay as active as possible without over-doing: rest if you start to feel tired or "winded"/ short of breath Continue to monitor and write down on paper daily fasting blood sugars several times each week Continue to work with home health team: RN once/ week; PT and OT Continue your efforts to prevent falls- use your walker when you are walking  Our next scheduled telephone follow up visit/ appointment with care management team member is scheduled on:  Monday, January 05, 2022 at 2:15 pm-- this is a PHONE CALL Visit  If you need to cancel or re-schedule our visit, please call [phone] and our care guide team will be happy to assist you.   I look forward to  hearing about your progress.   [name], RN, BSN, [place] [phone]: direct office  If you are experiencing a Mental Health or [place] or need someone to talk to, please  call the Suicide and Crisis Lifeline: 988 call the [place] National Suicide Prevention Lifeline: 513 625 2257 or TTY: (332) 318-8277 TTY (864) 102-3299) to talk to a trained counselor call 1-800-273-TALK (toll free, 24 hour hotline) go to [facility] [address], [place] [phone]) call 911   Patient verbalizes understanding of instructions provided today and agrees to view in [place]

## 2021-12-26 ENCOUNTER — Ambulatory Visit (INDEPENDENT_AMBULATORY_CARE_PROVIDER_SITE_OTHER): Payer: Commercial Managed Care - HMO | Admitting: Cardiology

## 2021-12-26 ENCOUNTER — Encounter: Payer: Self-pay | Admitting: Cardiology

## 2021-12-26 ENCOUNTER — Other Ambulatory Visit: Payer: Self-pay

## 2021-12-26 VITALS — BP 120/76 | HR 81 | Resp 20 | Ht 63.0 in | Wt 365.8 lb

## 2021-12-26 DIAGNOSIS — I5033 Acute on chronic diastolic (congestive) heart failure: Secondary | ICD-10-CM

## 2021-12-26 DIAGNOSIS — E118 Type 2 diabetes mellitus with unspecified complications: Secondary | ICD-10-CM | POA: Diagnosis not present

## 2021-12-26 DIAGNOSIS — I10 Essential (primary) hypertension: Secondary | ICD-10-CM

## 2021-12-26 NOTE — Patient Instructions (Signed)
Medication Instructions:  Continue same medications *If you need a refill on your cardiac medications before your next appointment, please call your pharmacy*   Lab Work: Bmet today   Testing/Procedures: None ordered   Follow-Up: At [facility], you and your health needs are our priority.  As part of our continuing mission to provide you with exceptional heart care, we have created designated Provider Care Teams.  These Care Teams include your primary Cardiologist (physician) and Advanced Practice Providers (APPs -  Physician Assistants and Nurse Practitioners) who all work together to provide you with the care you need, when you need it.  We recommend signing up for the patient portal called "MyChart".  Sign up information is provided on this After Visit Summary.  MyChart is used to connect with patients for Virtual Visits (Telemedicine).  Patients are able to view lab/test results, encounter notes, upcoming appointments, etc.  Non-urgent messages can be sent to your provider as well.   To learn more about what you can do with MyChart, go to [email or web address].      Your next appointment:  6 weeks    The format for your next appointment: Office    Provider:  PA

## 2021-12-27 LAB — BASIC METABOLIC PANEL
BUN/Creatinine Ratio: 13 (ref 12–28)
BUN: 15 mg/dL (ref 8–27)
CO2: 30 mmol/L — ABNORMAL HIGH (ref 20–29)
Calcium: 9.4 mg/dL (ref 8.7–10.3)
Chloride: 97 mmol/L (ref 96–106)
Creatinine, Ser: 1.18 mg/dL — ABNORMAL HIGH (ref 0.57–1.00)
Glucose: 79 mg/dL (ref 70–99)
Potassium: 4.1 mmol/L (ref 3.5–5.2)
Sodium: 142 mmol/L (ref 134–144)
eGFR: 52 mL/min/{1.73_m2} — ABNORMAL LOW (ref >59)

## 2021-12-28 ENCOUNTER — Other Ambulatory Visit: Payer: Self-pay | Admitting: Internal Medicine

## 2021-12-28 ENCOUNTER — Encounter: Payer: Self-pay | Admitting: Internal Medicine

## 2021-12-29 ENCOUNTER — Telehealth: Payer: Self-pay | Admitting: *Deleted

## 2021-12-29 ENCOUNTER — Telehealth: Payer: Self-pay | Admitting: Internal Medicine

## 2021-12-29 MED ORDER — TORSEMIDE 20 MG PO TABS: 20.0000 mg | ORAL_TABLET | Freq: Two times a day (BID) | ORAL | 1 refills | Status: DC

## 2021-12-29 NOTE — Telephone Encounter (Signed)
Spoke with pt, she is currently taking torsemide 20 mg twice daily. Medication in chart changed. Aware will forward to dr hochrein to get the correct directions for the torsemide.

## 2021-12-29 NOTE — Telephone Encounter (Signed)
Maria [name] from [facility] has called and is requesting verbals for [facility]. 1 week- 1 week, 2 week- 3 weeks, 1 week- 3 week to work on gait, balance, and strength. Pt just came out of hospital.    Callback #- [phone]

## 2021-12-29 NOTE — Telephone Encounter (Signed)
Ok for verbals 

## 2021-12-29 NOTE — Telephone Encounter (Signed)
-----   Message from Minus Breeding, MD sent at 12/27/2021 10:39 AM EST ----- Her labs are OK.  We can increase her Demadex to 80 mg PO in the am and 40 mg PO six hours later.  Please call with these instructions.  She will likely need a new prescription.  Call Ms. Strohecker with the results and send results to Biagio Borg, MD

## 2021-12-29 NOTE — Telephone Encounter (Signed)
Verbals given  

## 2021-12-30 ENCOUNTER — Encounter: Payer: Self-pay | Admitting: Internal Medicine

## 2021-12-30 ENCOUNTER — Ambulatory Visit (INDEPENDENT_AMBULATORY_CARE_PROVIDER_SITE_OTHER): Payer: Commercial Managed Care - HMO

## 2021-12-30 ENCOUNTER — Other Ambulatory Visit: Payer: Self-pay

## 2021-12-30 ENCOUNTER — Ambulatory Visit (INDEPENDENT_AMBULATORY_CARE_PROVIDER_SITE_OTHER): Payer: Commercial Managed Care - HMO | Admitting: Internal Medicine

## 2021-12-30 VITALS — BP 142/70 | HR 75 | Ht 63.0 in | Wt 371.0 lb

## 2021-12-30 DIAGNOSIS — Z0000 Encounter for general adult medical examination without abnormal findings: Secondary | ICD-10-CM | POA: Diagnosis not present

## 2021-12-30 DIAGNOSIS — E669 Obesity, unspecified: Secondary | ICD-10-CM

## 2021-12-30 DIAGNOSIS — I5032 Chronic diastolic (congestive) heart failure: Secondary | ICD-10-CM

## 2021-12-30 DIAGNOSIS — I10 Essential (primary) hypertension: Secondary | ICD-10-CM | POA: Diagnosis not present

## 2021-12-30 DIAGNOSIS — E1169 Type 2 diabetes mellitus with other specified complication: Secondary | ICD-10-CM | POA: Diagnosis not present

## 2021-12-30 MED ORDER — TORSEMIDE 20 MG PO TABS: ORAL_TABLET | ORAL | 1 refills | Status: DC

## 2021-12-30 NOTE — Progress Notes (Signed)
Subjective:   Maria [name] is a 66 y.o. female who presents for Medicare Annual (Subsequent) preventive examination.  Review of Systems     Cardiac Risk Factors include: diabetes mellitus;hypertension;obesity (BMI >30kg/m2);sedentary lifestyle;family history of premature cardiovascular disease;dyslipidemia     Objective:    Today's Vitals   12/30/21 1608  BP: (!) 142/70  Pulse: 75  SpO2: 100%  Weight: (!) 371 lb (168.3 kg)  Height: '5\' 3"'  (1.6 m)   Body mass index is 65.72 kg/m.  Advanced Directives 12/30/2021 12/18/2021 10/16/2021 09/19/2021 08/10/2021 04/25/2021 04/05/2021  Does Patient Have a Medical Advance Directive? No No No No No No No  Type of Advance Directive - - - - - - -  Does patient want to make changes to medical advance directive? - - - - - - -  Copy of [place] in Chart? - - - - - - -  Would patient like information on creating a medical advance directive? No - Patient declined No - Patient declined No - Patient declined No - Patient declined No - Patient declined Yes (MAU/Ambulatory/Procedural Areas - Information given) -  Pre-existing out of facility DNR order (yellow form or pink MOST form) - - - - - - -    Current Medications (verified) Outpatient Encounter Medications as of 12/30/2021  Medication Sig   Accu-Chek Softclix Lancets lancets Use as directed up to 4 times daily (Patient taking differently: 1 each by Other route in the morning, at noon, in the evening, and at bedtime.)   acetaminophen (TYLENOL) 325 MG tablet Take 650 mg by mouth every 6 (six) hours as needed for headache or fever (pain).   albuterol (PROAIR HFA) 108 (90 Base) MCG/ACT inhaler Inhale 2 puffs into the lungs every 6 (six) hours as needed for wheezing or shortness of breath.   Blood Glucose Monitoring Suppl (ACCU-CHEK GUIDE) w/Device KIT Use as directed (Patient taking differently: 1 each by Other route as directed.)   clonazePAM (KLONOPIN) 1 MG tablet TAKE 1 TABLET  BY MOUTH 2 TIMES A DAY AS NEEDED FOR ANXIETY (Patient taking differently: Take 1 mg by mouth 2 (two) times daily as needed for anxiety.)   cyclobenzaprine (FLEXERIL) 10 MG tablet TAKE 1 TABLET BY MOUTH 2 TIMES A DAY AS NEEDED FOR MUSCLE SPASMS   Fexofenadine HCl (ALLEGRA PO) Take 180 mg by mouth daily as needed (allergies).   glucose blood (ACCU-CHEK GUIDE) test strip Use as instructed up to 4 times daily   Metamucil Fiber CHEW Chew 3 tablets by mouth at bedtime.   metFORMIN (GLUCOPHAGE) 500 MG tablet Take 1 tablet (500 mg total) by mouth 2 (two) times daily with a meal. (Patient not taking: Reported on 12/30/2021)   metoprolol tartrate (LOPRESSOR) 25 MG tablet Take 25 mg by mouth 2 (two) times daily.   NARCAN 4 MG/0.1ML LIQD nasal spray kit Place 0.4 mg into the nose daily as needed (opioid overdose).   ondansetron (ZOFRAN ODT) 4 MG disintegrating tablet Take 1 tablet (4 mg total) by mouth every 4 (four) hours as needed for nausea or vomiting.   Oxycodone HCl 10 MG TABS Take 1 tablet (10 mg total) 4 (four) times daily by mouth. Per [facility] (Patient taking differently: Take 10 mg by mouth every 4 (four) hours as needed (pain). Per [facility])   pantoprazole (PROTONIX) 40 MG tablet TAKE ONE TABLET BY MOUTH DAILY (Patient taking differently: Take 40 mg by mouth daily.)   Polyethyl Glycol-Propyl Glycol  0.4-0.3 % SOLN Apply 1 drop to eye daily as needed (dry eyes).   polyethylene glycol powder (GLYCOLAX/MIRALAX) 17 GM/SCOOP powder Take 17 g by mouth daily as needed for mild constipation.   potassium chloride SA (KLOR-CON) 20 MEQ tablet Take 1 tablet (20 mEq total) by mouth 2 (two) times daily.   rOPINIRole (REQUIP) 1 MG tablet Take 1 tablet (1 mg total) by mouth 3 (three) times daily.   torsemide (DEMADEX) 20 MG tablet Take 2 tablets in the morning and 1 tablet in the afternoon.   [DISCONTINUED] DULoxetine (CYMBALTA) 60 MG capsule Take 60 mg by mouth daily.     No  facility-administered encounter medications on file as of 12/30/2021.    Allergies (verified) Amoxicillin-pot clavulanate, Adhesive [tape], Codeine, Crestor [rosuvastatin calcium], Morphine and related, Vicodin [hydrocodone-acetaminophen], and Latex   History: Past Medical History:  Diagnosis Date   Acute lymphadenitis 2011   ALLERGIC RHINITIS 08/10/2007   ANXIETY 12/06/2007   Atherosclerotic peripheral vascular disease ([place]) 06/13/2013   Aorta on CT June 2014   Cellulitis 2011   Cervical disc disease 03/09/2012   CHF (congestive heart failure) ([place])    Chronic pain 03/09/2012   DEPRESSION 12/06/2007   Diabetes ([place])    DIVERTICULOSIS, [place] 12/06/2007   GERD 12/06/2007   Hepatitis    age 82 hepatitis A   HLD (hyperlipidemia) 05/24/2019   HYPERTENSION 12/06/2007   Lumbar disc disease 03/09/2012   Mesenteric adenitis    MRSA 2006   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 2010   [place]  may 24th 2010   COLONOSCOPY WITH PROPOFOL N/A 07/10/2016   Procedure: COLONOSCOPY WITH PROPOFOL;  Surgeon: Manus Gunning, MD;  Location: [facility];  Service: Gastroenterology;  Laterality: N/A;   COLONOSCOPY WITH PROPOFOL N/A 09/26/2019   Procedure: COLONOSCOPY WITH PROPOFOL;  Surgeon: Yetta Flock, MD;  Location: [facility];  Service: Gastroenterology;  Laterality: N/A;   ESOPHAGOGASTRODUODENOSCOPY (EGD) WITH PROPOFOL N/A 07/10/2016   Procedure: ESOPHAGOGASTRODUODENOSCOPY (EGD) WITH PROPOFOL;  Surgeon: Manus Gunning, MD;  Location: [facility];  Service: Gastroenterology;  Laterality: N/A;   POLYPECTOMY  09/26/2019   Procedure: POLYPECTOMY;  Surgeon: Yetta Flock, MD;  Location: [facility];  Service: Gastroenterology;;   s/p ovary cyst      s/p right knee arthroscopy     Dr. Mardelle Matte ortho   Family History  Problem Relation Age of Onset   Heart disease Mother    Hypertension Mother    Diabetes Mother    Heart failure Mother    Asthma Sister    Anxiety disorder Sister    Depression Sister    Hypertension Father    Asthma Daughter    Bipolar disorder Daughter    Cancer Maternal Uncle        [place]   Cancer Other        ovarian   Liver cancer Paternal Grandmother        ????   Social History   Socioeconomic History   Marital status: Divorced    Spouse name: Not on file   Number of children: 2   Years of education: Not on file   Highest education level: Not on file  Occupational History   Occupation: disabled  Tobacco Use   Smoking  status: Former    Years: 30.00    Types: Cigarettes    Quit date: 11/08/2008    Years since quitting: 13.1   Smokeless tobacco: Never   Tobacco comments:    quit 10/09  Vaping Use   Vaping Use: Never used  Substance and Sexual Activity   Alcohol use: No    Comment: quit drinking 10/07/1997   Drug use: No   Sexual activity: Not Currently    Birth control/protection: Surgical, Abstinence  Other Topics Concern   Not on file  Social History Narrative   Patient has difficulty financially affording food, medication, and affording essential bills. She states she has limited support from her daughter.    Social Determinants of Health   Financial Resource Strain: Medium Risk   Difficulty of Paying Living Expenses: Somewhat hard  Food Insecurity: No Food Insecurity   Worried About Charity fundraiser in the Last Year: Never true   Ran Out of Food in the Last Year: Never true  Transportation Needs: No Transportation Needs   Lack of Transportation (Medical): No   Lack of Transportation (Non-Medical): No  Physical Activity: Inactive   Days of Exercise per Week: 0 days   Minutes of Exercise per Session: 0 min  Stress: No Stress Concern Present   Feeling of Stress : Not at all   Social Connections: Socially Isolated   Frequency of Communication with Friends and Family: More than three times a week   Frequency of Social Gatherings with Friends and Family: More than three times a week   Attends Religious Services: Never   Marine scientist or Organizations: No   Attends Archivist Meetings: Never   Marital Status: Never married    Tobacco Counseling Counseling given: Not Answered Tobacco comments: quit 10/09   Clinical Intake:  Pre-visit preparation completed: Yes  Pain : 0-10 Pain Type: Chronic pain Pain Location: Back Pain Orientation: Lower, Mid Pain Descriptors / Indicators: Constant, Discomfort, Dull Pain Onset: More than a month ago Pain Frequency: Constant Pain Relieving Factors: goes to pain management  Pain Type: Chronic pain Pain Location: Knee Pain Orientation: Left, Right Pain Descriptors / Indicators: Aching, Constant, Discomfort Pain Frequency: Constant Pain Relieving Factors: goes to pain management  BMI - recorded: 65.72 Nutritional Status: BMI > 30  Obese Nutritional Risks: None Diabetes: Yes CBG done?: No Did pt. bring in CBG monitor from home?: No  How often do you need to have someone help you when you read instructions, pamphlets, or other written materials from your doctor or pharmacy?: 1 - Never  Diabetic? yes  Interpreter Needed?: No  Information entered by :: Lisette Abu, LPN   Activities of Daily Living In your present state of health, do you have any difficulty performing the following activities: 12/30/2021 12/18/2021  Hearing? N N  Vision? N N  Difficulty concentrating or making decisions? N N  Walking or climbing stairs? Y N  Dressing or bathing? N N  Doing errands, shopping? N Y  Comment - patient doesn't drive; has everything delivered to her apt.  Preparing Food and eating ? N -  Using the Toilet? N -  In the past six months, have you accidently leaked urine? Y -  Do you have  problems with loss of bowel control? N -  Managing your Medications? N -  Managing your Finances? N -  Housekeeping or managing your Housekeeping? N -  Some recent data might be hidden    Patient Care  Team: Biagio Borg, MD as PCP - General (Internal Medicine) Minus Breeding, MD as PCP - Cardiology (Cardiology) Helayne Seminole, MD as Consulting Physician (Otolaryngology) Artis Delay, MD as Referring Physician (Internal Medicine) [name] Royalty, RN as Case Manager Minus Breeding, MD as Consulting Physician (Cardiology)  Indicate any recent Medical Services you may have received from other than [facility] providers in the past year (date may be approximate).     Assessment:   This is a routine wellness examination for [place].  Hearing/Vision screen Hearing Screening - Comments:: Patient denied any hearing difficulty.   No hearing aids.  Vision Screening - Comments:: Patient wears corrective glasses/contacts.  [facility] exam done annually by: [facility]  Dietary issues and exercise activities discussed: Current Exercise Habits: The patient does not participate in regular exercise at present, Exercise limited by: cardiac condition(s);respiratory conditions(s);orthopedic condition(s)   Goals Addressed               This Visit's Progress     Patient Stated (pt-stated)        Patient declined health goal at this time.      Depression Screen PHQ 2/9 Scores 12/30/2021 04/25/2021 12/31/2020 09/20/2020 09/10/2020 05/01/2020 01/31/2020  PHQ - 2 Score 0 0 0 0 0 0 0  PHQ- 9 Score - - - 0 0 4 -    Fall Risk Fall Risk  12/30/2021 08/26/2021 05/15/2021 04/25/2021 12/31/2020  Falls in the past year? 0 0 (No Data) 0 0  Comment - - patient confirms no new falls since last CCM RN CM outreach 04/30/21 - -  Number falls in past yr: 0 0 - 0 -  Comment - - - - -  Injury with Fall? 0 0 - 0 -  Comment - N/A- no falls reported - N/A- no falls reported -  Risk Factor Category  - - -  - -  Risk for fall due to : Impaired balance/gait Impaired mobility;Medication side effect - Medication side effect -  Follow up - Falls prevention discussed - Falls prevention discussed -    FALL RISK PREVENTION PERTAINING TO THE HOME:  Any stairs in or around the home? No  If so, are there any without handrails? No  Home free of loose throw rugs in walkways, pet beds, electrical cords, etc? Yes  Adequate lighting in your home to reduce risk of falls? Yes   ASSISTIVE DEVICES UTILIZED TO PREVENT FALLS:  Life alert? Yes  Use of a cane, walker or w/c? Yes  Grab bars in the bathroom? Yes  Shower chair or bench in shower? Yes  Elevated toilet seat or a handicapped toilet? Yes   TIMED UP AND GO:  Was the test performed? Yes .  Length of time to ambulate 10 feet: 12 sec.   Gait slow and steady with assistive device  Cognitive Function: Normal cognitive status assessed by direct observation by this Nurse Health Advisor. No abnormalities found.   MMSE - Mini Mental State Exam 12/29/2017  Orientation to time 5  Orientation to Place 5  Registration 3  Attention/ Calculation 5  Recall 2  Language- name 2 objects 2  Language- repeat 1  Language- follow 3 step command 3  Language- read & follow direction 1  Write a sentence 1  Copy design 1  Total score 29        Immunizations Immunization History  Administered Date(s) Administered   Hepatitis B, ped/adol 06/03/2016, 07/17/2016   Influenza Split 10/13/2012  Influenza, Seasonal, Injecte, Preservative Fre 09/19/2014, 10/08/2015, 09/11/2016   Influenza,inj,Quad PF,6+ Mos 08/31/2013, 11/08/2017, 12/30/2018, 12/31/2020, 10/29/2021   Moderna SARS-COV2 Booster Vaccination 10/19/2020   Moderna Sars-Covid-2 Vaccination 09/21/2020   Pneumococcal Polysaccharide-23 08/18/2013, 10/29/2021   Td 12/21/2004   Tdap 05/01/2015, 11/08/2017    TDAP status: Up to date  Flu Vaccine status: Up to date  Pneumococcal vaccine status: Up to  date  Covid-19 vaccine status: Completed vaccines  Qualifies for Shingles Vaccine? Yes   Zostavax completed No   Shingrix Completed?: No.    Education has been provided regarding the importance of this vaccine. Patient has been advised to call insurance company to determine out of pocket expense if they have not yet received this vaccine. Advised may also receive vaccine at local pharmacy or Health Dept. Verbalized acceptance and understanding.  Screening Tests Health Maintenance  Topic Date Due   FOOT EXAM  Never done   URINE MICROALBUMIN  Never done   Zoster Vaccines- Shingrix (1 of 2) Never done   COVID-19 Vaccine (2 - Moderna risk series) 11/16/2020   MAMMOGRAM  10/03/2021   OPHTHALMOLOGY EXAM  05/14/2022   HEMOGLOBIN A1C  06/19/2022   Pneumococcal Vaccine 74-54 Years old (3 - PCV) 10/29/2022   TETANUS/TDAP  11/09/2027   COLONOSCOPY (Pts 45-90yr Insurance coverage will need to be confirmed)  09/25/2029   INFLUENZA VACCINE  Completed   Hepatitis C Screening  Completed   HIV Screening  Completed   HPV VACCINES  Aged Out    Health Maintenance  Health Maintenance Due  Topic Date Due   FOOT EXAM  Never done   URINE MICROALBUMIN  Never done   Zoster Vaccines- Shingrix (1 of 2) Never done   COVID-19 Vaccine (2 - Moderna risk series) 11/16/2020   MAMMOGRAM  10/03/2021    Colorectal cancer screening: Type of screening: Colonoscopy. Completed 09/26/2019. Repeat every 10 years  Mammogram status: Completed 12/25/2020. Repeat every year  Bone density status: never done  Lung Cancer Screening: (Low Dose CT Chest recommended if Age 65-80years, 30 pack-year currently smoking OR have quit w/in 15years.) does not qualify.   Lung Cancer Screening Referral: no  Additional Screening:  Hepatitis C Screening: does qualify; Completed yes  Vision Screening: Recommended annual ophthalmology exams for early detection of glaucoma and other disorders of the eye. Is the patient up to date  with their annual eye exam?  Yes  Who is the provider or what is the name of the office in which the patient attends annual eye exams? [facility] pt is not established with a provider, would they like to be referred to a provider to establish care? No .   Dental Screening: Recommended annual dental exams for proper oral hygiene  Community Resource Referral / Chronic Care Management: CRR required this visit?  No   CCM required this visit?  No      Plan:     I have personally reviewed and noted the following in the patients chart:   Medical and social history Use of alcohol, tobacco or illicit drugs  Current medications and supplements including opioid prescriptions.  Functional ability and status Nutritional status Physical activity Advanced directives List of other physicians Hospitalizations, surgeries, and ER visits in previous 12 months Vitals Screenings to include cognitive, depression, and falls Referrals and appointments  In addition, I have reviewed and discussed with patient certain preventive protocols, quality metrics, and best practice recommendations. A written personalized care plan for preventive services  as well as general preventive health recommendations were provided to patient.     Sheral Flow, LPN   02/24/8404   Nurse Notes:  Hearing Screening - Comments:: Patient denied any hearing difficulty.   No hearing aids.  Vision Screening - Comments:: Patient wears corrective glasses/contacts.  Eye exam done annually by: [facility]

## 2021-12-30 NOTE — Telephone Encounter (Signed)
Spoke with pt, aware and repeated the torsemide dose per dr hochrein.

## 2021-12-30 NOTE — Progress Notes (Signed)
Patient ID: Mliss Sax Veloso, female   DOB: 04-18-1956, 66 y.o.   MRN: [id number]        Chief Complaint: follow up recent hospn dec 29 - jan 1       HPI:  Giannamarie Paulus Mcclusky is a 66 y.o. female overall doing ok, wt has increased 6 lbs in 4 days by todays measure , and pt now admits to drinking minimum 4 x 16 oz bottles water per day, in addition to usual fluids, mostly bc "my mouth is dry".  Has gained several lbs wt but difficult to tell if fluid or other.  Has seen cardiology now for torsemide 40qm,20 pm.  Also d/w pt regarding obesity and ok to try ozempic  Denies worsening reflux, abd pain, dysphagia, n/v, bowel change or blood.  Does have recurring muscle cramps often leg at night and hand during day with change in diuretic.  Pt denies chest pain, increased sob or doe, wheezing, orthopnea, PND, palpitations, dizziness or syncope.   Pt denies polydipsia, polyuria, or new focal neuro s/s.  Transitional Care Management elements noted today: 1)  Date of D/C: as above 2)  Medication reconciliation:  done today at end visit 3)  Review of D/C summary or other information:  done today 4)  Review of need for f/u on pending diagnostic tests and treatments:  done today 5)  Review of need for Interaction with other providers who will assume or resume care of pt specific problems: done today - none 6)  Education of patient/family/guardian or caregiver: done today - needs to slow on oral intake fluids  Wt Readings from Last 3 Encounters:  12/30/21 (!) 371 lb (168.3 kg)  12/30/21 (!) 371 lb (168.3 kg)  12/26/21 (!) 365 lb 12.8 oz (165.9 kg)   BP Readings from Last 3 Encounters:  12/30/21 (!) 142/70  12/30/21 (!) 142/70  12/26/21 120/76         Past Medical History:  Diagnosis Date   Acute lymphadenitis 2011   ALLERGIC RHINITIS 08/10/2007   ANXIETY 12/06/2007   Atherosclerotic peripheral vascular disease ([place]) 06/13/2013   Aorta on CT June 2014   Cellulitis 2011   Cervical disc disease 03/09/2012    CHF (congestive heart failure) ([place])    Chronic pain 03/09/2012   DEPRESSION 12/06/2007   Diabetes ([place])    DIVERTICULOSIS, [place] 12/06/2007   GERD 12/06/2007   Hepatitis    age 89 hepatitis A   HLD (hyperlipidemia) 05/24/2019   HYPERTENSION 12/06/2007   Lumbar disc disease 03/09/2012   Mesenteric adenitis    MRSA 2006   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 2010   [place] SURGERY  may 24th 2010   COLONOSCOPY WITH PROPOFOL N/A 07/10/2016   Procedure: COLONOSCOPY WITH PROPOFOL;  Surgeon: Manus Gunning, MD;  Location: [facility];  Service: Gastroenterology;  Laterality: N/A;   COLONOSCOPY WITH PROPOFOL N/A 09/26/2019   Procedure: COLONOSCOPY WITH PROPOFOL;  Surgeon: Yetta Flock, MD;  Location: [facility];  Service: Gastroenterology;  Laterality: N/A;   ESOPHAGOGASTRODUODENOSCOPY (EGD) WITH PROPOFOL N/A 07/10/2016   Procedure: ESOPHAGOGASTRODUODENOSCOPY (EGD) WITH PROPOFOL;  Surgeon: Manus Gunning, MD;  Location: [facility];  Service: Gastroenterology;  Laterality: N/A;   POLYPECTOMY  09/26/2019   Procedure: POLYPECTOMY;  Surgeon: Havery Moros,  Carlota Raspberry, MD;  Location: [facility];  Service: Gastroenterology;;   s/p ovary cyst     s/p right knee arthroscopy     Dr. Mardelle Matte ortho    reports that she quit smoking about 13 years ago. Her smoking use included cigarettes. She has never used smokeless tobacco. She reports that she does not drink alcohol and does not use drugs. family history includes Anxiety disorder in her sister; Asthma in her daughter and sister; Bipolar disorder in her daughter; Cancer in her maternal uncle and another family member; Depression in her sister; Diabetes in her mother; Heart disease in her mother; Heart failure in her  mother; Hypertension in her father and mother; Liver cancer in her paternal grandmother. Allergies  Allergen Reactions   Amoxicillin-Pot Clavulanate Nausea And Vomiting    Projectile vomiting Did it involve swelling of the face/tongue/throat, SOB, or low BP? No Did it involve sudden or severe rash/hives, skin peeling, or any reaction on the inside of your mouth or nose? No Did you need to seek medical attention at a hospital or doctor's office? No When did it last happen?      20-30 years If all above answers are NO, may proceed with cephalosporin use.    Adhesive [Tape] Other (See Comments)    Tears skin off - use paper tape    Codeine Other (See Comments)    hallucinations   Crestor [Rosuvastatin Calcium]     Severe muscle cramps   Morphine And Related Nausea And Vomiting and Other (See Comments)    Migraine headaches   Vicodin [Hydrocodone-Acetaminophen] Other (See Comments)    hallucinations   Latex Rash   Current Outpatient Medications on File Prior to Visit  Medication Sig Dispense Refill   Accu-Chek Softclix Lancets lancets Use as directed up to 4 times daily (Patient taking differently: 1 each by Other route in the morning, at noon, in the evening, and at bedtime.) 100 each 5   acetaminophen (TYLENOL) 325 MG tablet Take 650 mg by mouth every 6 (six) hours as needed for headache or fever (pain).     albuterol (PROAIR HFA) 108 (90 Base) MCG/ACT inhaler Inhale 2 puffs into the lungs every 6 (six) hours as needed for wheezing or shortness of breath. 1 each 0   Blood Glucose Monitoring Suppl (ACCU-CHEK GUIDE) w/Device KIT Use as directed (Patient taking differently: 1 each by Other route as directed.) 1 kit 0   cyclobenzaprine (FLEXERIL) 10 MG tablet TAKE 1 TABLET BY MOUTH 2 TIMES A DAY AS NEEDED FOR MUSCLE SPASMS 180 tablet 1   Fexofenadine HCl (ALLEGRA PO) Take 180 mg by mouth daily as needed (allergies).     glucose blood (ACCU-CHEK GUIDE) test strip Use as instructed up to 4  times daily 100 each 12   Metamucil Fiber CHEW Chew 3 tablets by mouth at bedtime.     metFORMIN (GLUCOPHAGE) 500 MG tablet Take 1 tablet (500 mg total) by mouth 2 (two) times daily with a meal. (Patient not taking: Reported on 12/30/2021) 180 tablet 2   metoprolol tartrate (LOPRESSOR) 25 MG tablet Take 25 mg by mouth 2 (two) times daily.     NARCAN 4 MG/0.1ML LIQD nasal spray kit Place 0.4 mg into the nose daily as needed (opioid overdose).     ondansetron (ZOFRAN ODT) 4 MG disintegrating tablet Take 1 tablet (4 mg total) by mouth every 4 (four) hours as needed for nausea or vomiting. 20 tablet 0   Oxycodone HCl 10 MG  TABS Take 1 tablet (10 mg total) 4 (four) times daily by mouth. Per [facility] (Patient taking differently: Take 10 mg by mouth every 4 (four) hours as needed (pain). Per [facility]) 30 tablet 0   pantoprazole (PROTONIX) 40 MG tablet TAKE ONE TABLET BY MOUTH DAILY (Patient taking differently: Take 40 mg by mouth daily.) 90 tablet 2   Polyethyl Glycol-Propyl Glycol 0.4-0.3 % SOLN Apply 1 drop to eye daily as needed (dry eyes).     polyethylene glycol powder (GLYCOLAX/MIRALAX) 17 GM/SCOOP powder Take 17 g by mouth daily as needed for mild constipation. 510 g 0   potassium chloride SA (KLOR-CON) 20 MEQ tablet Take 1 tablet (20 mEq total) by mouth 2 (two) times daily. 180 tablet 3   rOPINIRole (REQUIP) 1 MG tablet Take 1 tablet (1 mg total) by mouth 3 (three) times daily. 90 tablet 1   torsemide (DEMADEX) 20 MG tablet Take 2 tablets in the morning and 1 tablet in the afternoon. 180 tablet 1   [DISCONTINUED] DULoxetine (CYMBALTA) 60 MG capsule Take 60 mg by mouth daily.       No current facility-administered medications on file prior to visit.        ROS:  All others reviewed and negative.  Objective        PE:  BP (!) 142/70 (BP Location: Left Arm, Patient Position: Sitting, Cuff Size: Large)    Pulse 75    Ht '5\' 3"'  (1.6 m)    Wt (!) 371 lb (168.3 kg)    SpO2 100%     BMI 65.72 kg/m                 Constitutional: Pt appears in NAD               HENT: Head: NCAT.                Right Ear: External ear normal.                 Left Ear: External ear normal.                Eyes: . Pupils are equal, round, and reactive to light. Conjunctivae and EOM are normal               Nose: without d/c or deformity               Neck: Neck supple. Gross normal ROM               Cardiovascular: Normal rate and regular rhythm.                 Pulmonary/Chest: Effort normal and breath sounds without rales or wheezing.                Abd:  Soft, NT, ND, + BS, no organomegaly               Neurological: Pt is alert. At baseline orientation, motor grossly intact               Skin: Skin is warm. No rashes, no other new lesions, LE edema -1+ edema bilataerl               Psychiatric: Pt behavior is normal without agitation   Micro: none  Cardiac tracings I have personally interpreted today:  none  Pertinent Radiological findings (summarize): none   Lab Results  Component Value Date   WBC 7.0  12/19/2021   HGB 10.9 (L) 12/19/2021   HCT 31.9 (L) 12/19/2021   PLT 242 12/19/2021   GLUCOSE 79 12/26/2021   CHOL 147 07/07/2021   TRIG 140.0 07/07/2021   HDL 43.30 07/07/2021   LDLDIRECT 127.0 01/25/2019   LDLCALC 76 07/07/2021   ALT 11 10/16/2021   AST 10 (L) 10/16/2021   NA 142 12/26/2021   K 4.1 12/26/2021   CL 97 12/26/2021   CREATININE 1.18 (H) 12/26/2021   BUN 15 12/26/2021   CO2 30 (H) 12/26/2021   TSH 1.45 12/31/2020   INR 1.0 04/01/2021   HGBA1C 6.8 (H) 12/19/2021   Assessment/Plan:  Christinea A Polansky is a 66 y.o. White or Caucasian [1] female with  has a past medical history of Acute lymphadenitis (2011), ALLERGIC RHINITIS (08/10/2007), ANXIETY (12/06/2007), Atherosclerotic peripheral vascular disease ([place]) (06/13/2013), Cellulitis (2011), Cervical disc disease (03/09/2012), CHF (congestive heart failure) ([place]), Chronic pain (03/09/2012), DEPRESSION  (12/06/2007), Diabetes ([place]), DIVERTICULOSIS, [place] (12/06/2007), GERD (12/06/2007), Hepatitis, HLD (hyperlipidemia) (05/24/2019), HYPERTENSION (12/06/2007), Lumbar disc disease (03/09/2012), Mesenteric adenitis, MRSA (2006), 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[place]) (11/08/2017), and THORACIC/LUMBOSACRAL NEURITIS/RADICULITIS UNSPEC (12/28/2008).  Diastolic congestive heart failure (H[facility]) Pt to reduce forced oral intake fluids, continue torsemide increased dose as per cardiology, cont to follow as planned with bmp  Diabetes mellitus type 2 in obese [facility]) Lab Results  Component Value Date   HGBA1C 6.8 (H) 12/19/2021   Mild uncontrolled,, pt to continue current medical treatment metformin, add ozempic if able with insurance coverage   Essential hypertension BP Readings from Last 3 Encounters:  12/30/21 (!) 142/70  12/30/21 (!) 142/70  12/26/21 120/76   Stable, pt to continue medical treatment lopressor, torsemide   Morbid obesity ([place]) [place] for ozempic if ok with insurance  Followup: Return in about 3 months (around 03/30/2022).  Cathlean Cower, MD 01/04/2022 4:58 AM [facility]

## 2021-12-30 NOTE — Patient Instructions (Signed)
Ok to take the ozempic 0.25 mg weekly to start  Please continue all other medications as before, and refills have been done if requested.  Please have the pharmacy call with any other refills you may need.  Please continue your efforts at being more active, low cholesterol diet, and weight control.  Please keep your appointments with your specialists as you may have planned  Please make an Appointment to return in 3 months, or sooner if needed

## 2021-12-31 ENCOUNTER — Other Ambulatory Visit: Payer: Self-pay | Admitting: Internal Medicine

## 2021-12-31 DIAGNOSIS — I110 Hypertensive heart disease with heart failure: Secondary | ICD-10-CM | POA: Diagnosis not present

## 2021-12-31 DIAGNOSIS — E1151 Type 2 diabetes mellitus with diabetic peripheral angiopathy without gangrene: Secondary | ICD-10-CM | POA: Diagnosis not present

## 2021-12-31 DIAGNOSIS — I872 Venous insufficiency (chronic) (peripheral): Secondary | ICD-10-CM | POA: Diagnosis not present

## 2021-12-31 DIAGNOSIS — I5033 Acute on chronic diastolic (congestive) heart failure: Secondary | ICD-10-CM | POA: Diagnosis not present

## 2021-12-31 DIAGNOSIS — I890 Lymphedema, not elsewhere classified: Secondary | ICD-10-CM | POA: Diagnosis not present

## 2022-01-02 DIAGNOSIS — I110 Hypertensive heart disease with heart failure: Secondary | ICD-10-CM | POA: Diagnosis not present

## 2022-01-02 DIAGNOSIS — I5033 Acute on chronic diastolic (congestive) heart failure: Secondary | ICD-10-CM | POA: Diagnosis not present

## 2022-01-02 DIAGNOSIS — I890 Lymphedema, not elsewhere classified: Secondary | ICD-10-CM | POA: Diagnosis not present

## 2022-01-02 DIAGNOSIS — E1151 Type 2 diabetes mellitus with diabetic peripheral angiopathy without gangrene: Secondary | ICD-10-CM | POA: Diagnosis not present

## 2022-01-02 DIAGNOSIS — I872 Venous insufficiency (chronic) (peripheral): Secondary | ICD-10-CM | POA: Diagnosis not present

## 2022-01-04 ENCOUNTER — Encounter: Payer: Self-pay | Admitting: Internal Medicine

## 2022-01-04 NOTE — Assessment & Plan Note (Signed)
[place] for [employer] if ok with insurance

## 2022-01-04 NOTE — Assessment & Plan Note (Signed)
BP Readings from Last 3 Encounters:  12/30/21 (!) 142/70  12/30/21 (!) 142/70  12/26/21 120/76   Stable, pt to continue medical treatment lopressor, torsemide

## 2022-01-04 NOTE — Assessment & Plan Note (Signed)
Lab Results  Component Value Date   HGBA1C 6.8 (H) 12/19/2021   Mild uncontrolled,, pt to continue current medical treatment metformin, add ozempic if able with insurance coverage

## 2022-01-04 NOTE — Assessment & Plan Note (Addendum)
Pt to reduce forced oral intake fluids, continue torsemide increased dose as per cardiology, cont to follow as planned with bmp

## 2022-01-05 ENCOUNTER — Other Ambulatory Visit: Payer: Self-pay | Admitting: Cardiology

## 2022-01-05 ENCOUNTER — Ambulatory Visit (INDEPENDENT_AMBULATORY_CARE_PROVIDER_SITE_OTHER): Payer: Commercial Managed Care - HMO | Admitting: *Deleted

## 2022-01-05 DIAGNOSIS — I5032 Chronic diastolic (congestive) heart failure: Secondary | ICD-10-CM

## 2022-01-05 DIAGNOSIS — E1169 Type 2 diabetes mellitus with other specified complication: Secondary | ICD-10-CM

## 2022-01-05 NOTE — Patient Instructions (Signed)
[name], thank you for taking time to talk with me today. Please don't hesitate to contact me if I can be of assistance to you before our next scheduled telephone appointment.  Below are the goals we discussed today:  Patient Self-Care Activities: [name] will: Take medications as prescribed Attend all scheduled provider appointments Call pharmacy for medication refills Call provider office for new concerns or questions Keep up the great work monitoring and writing down on paper daily weights at home: today you reported a weight of 365 lbs Continue to follow prescribed diet: low carb/ low sugar; heart healthy/ low salt; and will follow fluid restrictions of 48- 64 ounces per day Keep Dr. Percival Spanish updated about the ongoing concerns you experience with your fluid pill not pulling off fluid effectively, causing you to have to go to the hospital frequently Watch daily for increased feeling of shortness of breath, swelling in feet, ankles and legs every day: if you have an increased amount of shortness of breath or swelling, make a note of it and let your cardiology provider know right away if it persists or gets worse Continue to keep legs up while sitting and wear compression stockings during daytime hours Stay as active as possible without over-doing: rest if you start to feel tired or "winded"/ short of breath Continue to monitor and write down on paper daily fasting blood sugars several times each week- the blood sugars you reported today are in good range Continue to work with home health team: PT  Continue your efforts to prevent falls- use your walker when you are walking  Our next scheduled telephone follow up visit/ appointment with care management team member is scheduled on:  Wednesday, February 18, 2022 at 2:15 pm  If you need to cancel or re-schedule our visit, please call [phone] and our care guide team will be happy to assist you.   I look forward to  hearing about your progress.   [name], RN, BSN, [place] [phone]: direct office  If you are experiencing a Mental Health or [place] or need someone to talk to, please  call the Suicide and Crisis Lifeline: 988 call the [place] National Suicide Prevention Lifeline: (336) 306-7248 or TTY: 737-212-0104 TTY (343)843-5335) to talk to a trained counselor call 1-800-273-TALK (toll free, 24 hour hotline) go to [facility] [address] [place] [phone]) call 911   The patient verbalized understanding of instructions, educational materials, and care plan provided today and agreed to receive a mailed copy of patient instructions, educational materials, and care plan  Low-Sodium Eating Plan Sodium, which is an element that makes up salt, helps you maintain a healthy balance of fluids in your body. Too much sodium can increase your blood pressure and cause fluid and waste to be held in your body. Your health care provider or dietitian may recommend following this plan if you have high blood pressure (hypertension), kidney disease, liver disease, or heart failure. Eating less sodium can help lower your blood pressure, reduce swelling, and protect your heart, liver, and kidneys. What are tips for following this plan? Reading food labels The Nutrition Facts label lists the amount of sodium in one serving of the food. If you eat more than one serving, you must multiply the listed amount of sodium by the number of servings. Choose foods with less than 140 mg of sodium per serving. Avoid foods with  300 mg of sodium or more per serving. Shopping  Look for lower-sodium products, often labeled as "low-sodium" or "no salt added." Always check the sodium content, even if foods are labeled as "unsalted" or "no salt added." Buy fresh foods. Avoid canned foods and pre-made or  frozen meals. Avoid canned, cured, or processed meats. Buy breads that have less than 80 mg of sodium per slice. Cooking  Eat more home-cooked food and less restaurant, buffet, and fast food. Avoid adding salt when cooking. Use salt-free seasonings or herbs instead of table salt or sea salt. Check with your health care provider or pharmacist before using salt substitutes. Cook with plant-based oils, such as canola, sunflower, or olive oil. Meal planning When eating at a restaurant, ask that your food be prepared with less salt or no salt, if possible. Avoid dishes labeled as brined, pickled, cured, smoked, or made with soy sauce, miso, or teriyaki sauce. Avoid foods that contain MSG (monosodium glutamate). MSG is sometimes added to [place] food, bouillon, and some canned foods. Make meals that can be grilled, baked, poached, roasted, or steamed. These are generally made with less sodium. General information Most people on this plan should limit their sodium intake to 1,500-2,000 mg (milligrams) of sodium each day. What foods should I eat? Fruits Fresh, frozen, or canned fruit. Fruit juice. Vegetables Fresh or frozen vegetables. "No salt added" canned vegetables. "No salt added" tomato sauce and paste. Low-sodium or reduced-sodium tomato and vegetable juice. Grains Low-sodium cereals, including oats, puffed wheat and rice, and shredded wheat. Low-sodium crackers. Unsalted rice. Unsalted pasta. Low-sodium bread. Whole-grain breads and whole-grain pasta. Meats and other proteins Fresh or frozen (no salt added) meat, poultry, seafood, and fish. Low-sodium canned tuna and salmon. Unsalted nuts. Dried peas, beans, and lentils without added salt. Unsalted canned beans. Eggs. Unsalted nut butters. Dairy Milk. Soy milk. Cheese that is naturally low in sodium, such as ricotta cheese, fresh mozzarella, or Swiss cheese. Low-sodium or reduced-sodium cheese. Cream cheese. Yogurt. Seasonings and  condiments Fresh and dried herbs and spices. Salt-free seasonings. Low-sodium mustard and ketchup. Sodium-free salad dressing. Sodium-free light mayonnaise. Fresh or refrigerated horseradish. Lemon juice. Vinegar. Other foods Homemade, reduced-sodium, or low-sodium soups. Unsalted popcorn and pretzels. Low-salt or salt-free chips. The items listed above may not be a complete list of foods and beverages you can eat. Contact a dietitian for more information. What foods should I avoid? Vegetables Sauerkraut, pickled vegetables, and relishes. Olives. [place] fries. Onion rings. Regular canned vegetables (not low-sodium or reduced-sodium). Regular canned tomato sauce and paste (not low-sodium or reduced-sodium). Regular tomato and vegetable juice (not low-sodium or reduced-sodium). Frozen vegetables in sauces. Grains Instant hot cereals. Bread stuffing, pancake, and biscuit mixes. Croutons. Seasoned rice or pasta mixes. Noodle soup cups. Boxed or frozen macaroni and cheese. Regular salted crackers. Self-rising flour. Meats and other proteins Meat or fish that is salted, canned, smoked, spiced, or pickled. Precooked or cured meat, such as sausages or meat loaves. [place] Salines. Ham. Pepperoni. Hot dogs. Corned beef. Chipped beef. Salt pork. Jerky. Pickled herring. Anchovies and sardines. Regular canned tuna. Salted nuts. Dairy Processed cheese and cheese spreads. Hard cheeses. Cheese curds. Blue cheese. Feta cheese. String cheese. Regular cottage cheese. Buttermilk. Canned milk. Fats and oils Salted butter. Regular margarine. Ghee. Bacon fat. Seasonings and condiments Onion salt, garlic salt, seasoned salt, table salt, and sea salt. Canned and packaged gravies. Worcestershire sauce. Tartar sauce. Barbecue sauce. Teriyaki sauce. Soy sauce, including reduced-sodium. Steak sauce. Fish sauce. Oyster sauce.  Cocktail sauce. Horseradish that you find on the shelf. Regular ketchup and mustard. Meat flavorings and  tenderizers. Bouillon cubes. Hot sauce. Pre-made or packaged marinades. Pre-made or packaged taco seasonings. Relishes. Regular salad dressings. Salsa. Other foods Salted popcorn and pretzels. Corn chips and puffs. Potato and tortilla chips. Canned or dried soups. Pizza. Frozen entrees and pot pies. The items listed above may not be a complete list of foods and beverages you should avoid. Contact a dietitian for more information. Summary Eating less sodium can help lower your blood pressure, reduce swelling, and protect your heart, liver, and kidneys. Most people on this plan should limit their sodium intake to 1,500-2,000 mg (milligrams) of sodium each day. Canned, boxed, and frozen foods are high in sodium. Restaurant foods, fast foods, and pizza are also very high in sodium. You also get sodium by adding salt to food. Try to cook at home, eat more fresh fruits and vegetables, and eat less fast food and canned, processed, or prepared foods. This information is not intended to replace advice given to you by your health care provider. Make sure you discuss any questions you have with your health care provider. Document Revised: 01/12/2020 Document Reviewed: 11/08/2019 Elsevier Patient Education  2022 [employer].

## 2022-01-05 NOTE — Chronic Care Management (AMB) (Signed)
Chronic Care Management   CCM RN Visit Note  01/05/2022 Name: Maria [name] MRN: [id number] DOB: [birth year]  Subjective: Maria [name] is a 66 y.o. year old female who is a primary care patient of Biagio Borg, MD. The care management team was consulted for assistance with disease management and care coordination needs.    Engaged with patient by telephone for follow up visit in response to provider referral for case management and/or care coordination services.   Consent to Services:  The patient was given information about Chronic Care Management services, agreed to services, and gave verbal consent prior to initiation of services.  Please see initial visit note for detailed documentation.  Patient agreed to services and verbal consent obtained.   Assessment: Review of patient past medical history, allergies, medications, health status, including review of consultants reports, laboratory and other test data, was performed as part of comprehensive evaluation and provision of chronic care management services.   SDOH (Social Determinants of Health) assessments and interventions performed:  SDOH Interventions    Flowsheet Row Most Recent Value  SDOH Interventions   Food Insecurity Interventions Intervention Not Indicated  [Patient reports "gets plenty of food assistance, " denies food insecurity]  Transportation Interventions Intervention Not Indicated  [uses transportation resources through insurance provider and [facility] transportation]      CCM Care Plan  Allergies  Allergen Reactions   Amoxicillin-Pot Clavulanate Nausea And Vomiting    Projectile vomiting Did it involve swelling of the face/tongue/throat, SOB, or low BP? No Did it involve sudden or severe rash/hives, skin peeling, or any reaction on the inside of your mouth or nose? No Did you need to seek medical attention at a hospital or doctor's office? No When did it last happen?      20-30 years If all above  answers are NO, may proceed with cephalosporin use.    Adhesive [Tape] Other (See Comments)    Tears skin off - use paper tape    Codeine Other (See Comments)    hallucinations   Crestor [Rosuvastatin Calcium]     Severe muscle cramps   Morphine And Related Nausea And Vomiting and Other (See Comments)    Migraine headaches   Vicodin [Hydrocodone-Acetaminophen] Other (See Comments)    hallucinations   Latex Rash   Outpatient Encounter Medications as of 01/05/2022  Medication Sig Note   Accu-Chek Softclix Lancets lancets Use as directed up to 4 times daily (Patient taking differently: 1 each by Other route in the morning, at noon, in the evening, and at bedtime.)    acetaminophen (TYLENOL) 325 MG tablet Take 650 mg by mouth every 6 (six) hours as needed for headache or fever (pain).    albuterol (PROAIR HFA) 108 (90 Base) MCG/ACT inhaler Inhale 2 puffs into the lungs every 6 (six) hours as needed for wheezing or shortness of breath.    Blood Glucose Monitoring Suppl (ACCU-CHEK GUIDE) w/Device KIT Use as directed (Patient taking differently: 1 each by Other route as directed.)    clonazePAM (KLONOPIN) 1 MG tablet Take 1 tablet (1 mg total) by mouth 2 (two) times daily as needed for anxiety.    cyclobenzaprine (FLEXERIL) 10 MG tablet TAKE 1 TABLET BY MOUTH 2 TIMES A DAY AS NEEDED FOR MUSCLE SPASMS    Fexofenadine HCl (ALLEGRA PO) Take 180 mg by mouth daily as needed (allergies).    glucose blood (ACCU-CHEK GUIDE) test strip Use as instructed up to 4 times daily    Metamucil  Fiber CHEW Chew 3 tablets by mouth at bedtime.    metFORMIN (GLUCOPHAGE) 500 MG tablet Take 1 tablet (500 mg total) by mouth 2 (two) times daily with a meal. (Patient not taking: Reported on 12/30/2021)    metoprolol tartrate (LOPRESSOR) 25 MG tablet Take 25 mg by mouth 2 (two) times daily. 12/18/2021: LF at [facility] on 10/28/21. 90 Day Supply    NARCAN 4 MG/0.1ML LIQD nasal spray kit Place 0.4 mg into the nose daily  as needed (opioid overdose).    ondansetron (ZOFRAN ODT) 4 MG disintegrating tablet Take 1 tablet (4 mg total) by mouth every 4 (four) hours as needed for nausea or vomiting.    Oxycodone HCl 10 MG TABS Take 1 tablet (10 mg total) 4 (four) times daily by mouth. Per [facility] (Patient taking differently: Take 10 mg by mouth every 4 (four) hours as needed (pain). Per [facility]) 12/18/2021: LF at [facility] on 12/08/21. 30 Day Supply    pantoprazole (PROTONIX) 40 MG tablet TAKE ONE TABLET BY MOUTH DAILY (Patient taking differently: Take 40 mg by mouth daily.) 12/18/2021: LF at [facility] on 11/06/21. 90 Day Supply    Polyethyl Glycol-Propyl Glycol 0.4-0.3 % SOLN Apply 1 drop to eye daily as needed (dry eyes).    polyethylene glycol powder (GLYCOLAX/MIRALAX) 17 GM/SCOOP powder Take 17 g by mouth daily as needed for mild constipation.    potassium chloride SA (KLOR-CON) 20 MEQ tablet Take 1 tablet (20 mEq total) by mouth 2 (two) times daily.    rOPINIRole (REQUIP) 1 MG tablet Take 1 tablet (1 mg total) by mouth 3 (three) times daily. 12/18/2021: LF at [facility] on 11/24/21. 30 Day Supply    torsemide (DEMADEX) 20 MG tablet Take 2 tablets in the morning and 1 tablet in the afternoon. 01/05/2022: 01/05/22- reports currently taking (3) 20 mg po in morning after having had weight gain x 3 days- up to 373 lbs (baseline 365) and (1) 20 mg in evening   [DISCONTINUED] DULoxetine (CYMBALTA) 60 MG capsule Take 60 mg by mouth daily.      No facility-administered encounter medications on file as of 01/05/2022.   Patient Active Problem List   Diagnosis Date Noted   Dyspnea 12/19/2021   CHF exacerbation ([place]) 12/18/2021   Leg swelling 11/20/2021   Bilateral knee pain 10/29/2021   Hypernatremia 09/24/2021   CKD (chronic kidney disease), stage IIIa ([place]) 09/24/2021   Sacral decubitus ulcer, stage II ([place]) 09/20/2021   Acute on chronic diastolic CHF (congestive heart failure) ([place])  09/19/2021   Nail disorder 07/07/2021   Pre-ulcerative corn or callous 07/07/2021   Rash 63/81/7711   Diastolic congestive heart failure ([place]) 03/17/2021   Diabetes mellitus type 2 in obese ([place]) 03/11/2021   Vitamin D deficiency 01/01/2021   Medial epicondylitis 12/31/2020   Aortic atherosclerosis ([place]) 65/79/0383   Umbilical hernia 33/83/2919   Constipation 08/05/2020   Arthritis 06/12/2020   Hoarseness 05/01/2020   Vitamin B 12 deficiency 01/31/2020   Rosacea 01/31/2020   Neck swelling 01/31/2020   History of colonic polyps    Benign neoplasm of [place]    Nausea 08/31/2019   Throat pain 07/26/2019   HLD (hyperlipidemia) 05/24/2019   Leg cramps 05/24/2019   Lump in neck 05/24/2019   Left thyroid nodule 01/25/2019   Jerking movements of extremities 07/20/2018   Balance disorder 07/20/2018   Right knee pain 03/21/2018   OSA (obstructive sleep apnea) 03/21/2018   Oxygen desaturation 03/21/2018  Urinary symptom or sign 03/21/2018   Acquired lymphedema 01/25/2018   Gait disorder 01/25/2018   Peripheral edema 01/16/2018   SOB (shortness of breath) 01/15/2018   Dysphonia 12/07/2017   Encounter for well adult exam with abnormal findings 11/08/2017   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   Jorgenson polyp 08/11/2016   Abdominal pain, epigastric    Dysphagia    [place] cancer screening    ACE-inhibitor cough 12/05/2015   Restless legs syndrome 10/08/2015   Venous stasis dermatitis of both lower extremities 04/26/2015   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/17/2014   AR (allergic rhinitis) 10/17/2014   Fatty liver 10/17/2014   Chronic pain syndrome 09/19/2014   Post-traumatic osteoarthritis of both knees 09/19/2014   Spinal stenosis 09/19/2014   Depression, major, single episode, complete remission ([place]) 09/19/2014   Narcotic dependence ([place]) 09/19/2014   Spondylosis of 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 03/09/2012   Lumbar disc disease 03/09/2012   Chronic pain  03/09/2012   Morbid obesity ([place]) 06/17/2011   Chronic low back pain 12/28/2008   Anxiety state 12/06/2007   Depression 12/06/2007   Essential hypertension 12/06/2007   Gastroesophageal reflux disease without esophagitis 12/06/2007   LOW BACK PAIN 12/06/2007   Conditions to be addressed/monitored:  CHF and DMII  Care Plan : RN Care Manager Plan of Care  Updates made by [name] Royalty, RN since 01/05/2022 12:00 AM     Problem: Chronic Disease Management Needs   Priority: Medium     Long-Range Goal: Ongoing adherence to plan of care for long term chronic disease management   Start Date: 08/26/2021  Expected End Date: 08/26/2022  Priority: Medium  Note:   Current Barriers:  Chronic Disease Management support and education needs related to CHF and DMII Fragile state of health, multiple progressing chronic health conditions Recent in-patient hospitalizations x 2 in Spring 2022 for CHF exacerbation/ weight gain at home, shortness of breath Hospitalization: September 30- September 25, 2021- CHF exacerbation; discharged with home health services in place;  Hospitalization December 18, 2021- December 21, 2021 for CHF exacerbation with shortness of breath: this is patient's 4th inpatient hospitalization in one year for CHF exacerbation  RNCM Clinical Goal(s):  Patient will demonstrate ongoing health management independence for CHF and DMII  through collaboration with RN Care manager, provider, and care team.   Interventions: 1:1 collaboration with primary care provider regarding development and update of comprehensive plan of care as evidenced by provider attestation and co-signature Inter-disciplinary care team collaboration (see longitudinal plan of care) Evaluation of current treatment plan related to  self management and patient's adherence to plan as established by provider Chart reviewed including relevant office notes, upcoming scheduled appointments, and lab results SDOH updated: no new/  unmet concerns identified Pain assessment updated: ongoing chronic pain in back: continues well managed with prescribed pain medications; continues followed by pain management provider: has scheduled appointment this Friday 01/09/22 Falls assessment updated: denies new/ recent falls since last outreach 12/25/21- continues using cane/ walker as needed/ indicated  Heart Failure Interventions:  (Status: Goal on Track (progressing): YES.) Discussed importance of daily weight and advised patient to weigh and record daily Reviewed role of diuretics in prevention of fluid overload and management of heart failure Discussed the importance of keeping all appointments with provider Screening for signs and symptoms of depression related to chronic disease state  Confirmed patient attended recent cardiology (12/26/21) and PCP (12/30/21) provider office visits: reviewed both with patient: she verbalizes good understanding of general plan of  care post- provider visits and denies questions Confirmed not taking metolazone; continues taking Torsemide as prescribed, 60 mg po q am and 20 mg po q pm: reviewed recommended doses with patient, post-cardiology provider office visit 12/26/21-- she tells me she "tried" to take the 40 mg po morning/ 20 mg evening regimen as recommended-- however, "this did not work-"- reports her weights "shot up to "375" (baseline weight= 365 lbs) x 3 days and she became swollen "all over, especially in legs and abdomen," in addition to the weight gain; also noticed increased shortness of breath Patient reports she is now/ currently taking Torsemide 60 mg po q am and 20 mg po q pm Reports that taking 60 mg in am/ 20 mg in pm, "has completely resolved" extra fluid build up: states weight have started trending back to baseline as of this morning  Reports weight this morning of "365 lbs" and she sounds to be in no distress throughout our phone conversation today; she confirms "swelling is down and I'm  breathing much easier" Confirmed [facility] health services [facility] for PT: encouraged her ongoing participation with PT Confirms continues following low salt, heart healthy, carb-modified, low sugar diet: we discussed parameters of low salt diet; she reports trying to keep sodium intake to < 1200-1400 mg/ day-- positive reinforcement provided with encouragement to continue efforts, will provide printed educational material Reinforced previously provided education around signs/ symptoms CHF yellow zone, importance of daily assessments at home, action plan for yellow CHF zone: patient remains able to verbalize all with minimal prompting-- however, she will continue to benefit from ongoing support and reinforcement given fragile, ever-changing clinical status Reviewed with patient upcoming provider office visits: 01/09/22- pain management clinic; 01/30/22- PCP office visit; noted she has 2 cardiology provider office visit scheduled within one week of each other: 02/02/22 and 02/10/22: patient will follow up with cardiology office staff to clarify if she needs to attend one or both; otherwise she verbalizes awareness of scheduled office visit and plans to attend as scheduled  Diabetes:  (Status: Goal on Track (progressing): YES.) Lab Results  Component Value Date   HGBA1C 6.8 (H) 12/19/2021  Reviewed prescribed diet with patient low carb/ low sugar; heart healthy, low salt, fluid restricted- <48 ounces per day; confirms patient following diet recommendations; Counseled on importance of regular laboratory monitoring as prescribed;        Discussed plans with patient for ongoing care management follow up and provided patient with direct contact information for care management team;      Review of patient status, including review of consultants reports, relevant laboratory and other test results, and medications completed;       no changes/ unmet concerns identified Reviewed recent blood sugars at  home: continues monitoring daily for now due to increased A1-C/ blood sugars while hospitalized: again reports recent fasting ranges at home consistently between 96- 110; consistent post-prandial ranges between 120-140; confirms she is no longer taking metformin due to renal/ kidney function; verbalizes plans to start taking Ozempic "tomorrow" as discussed at time of PCP office visit 12/30/21 Discussed that patient had referral placed by PCP 12/30/21 for "[facility] Weight Management" team: she confirms that she was contacted by team and was told that she would be contacted by them again, but that there is a current waiting list which they expect to be in place for > one year; she would like the phone number to the Weight management team for her records-- I have reached out to  PCP referral coordinator to clarify and will update patient accordingly  Patient Goals/Self-Care Activities: As evidenced by review of EHR, collaboration with care team, and patient reporting during CCM RN CM outreach,  [name] will: Take medications as prescribed Attend all scheduled provider appointments Call pharmacy for medication refills Call provider office for new concerns or questions Keep up the great work monitoring and writing down on paper daily weights at home: today you reported a weight of 365 lbs Continue to follow prescribed diet: low carb/ low sugar; heart healthy/ low salt; and will follow fluid restrictions of 48- 64 ounces per day Keep Dr. Percival Spanish updated about the ongoing concerns you experience with your fluid pill not pulling off fluid effectively, causing you to have to go to the hospital frequently Watch daily for increased feeling of shortness of breath, swelling in feet, ankles and legs every day: if you have an increased amount of shortness of breath or swelling, make a note of it and let your cardiology provider know right away if it persists or gets worse Continue to keep legs up while sitting  and wear compression stockings during daytime hours Stay as active as possible without over-doing: rest if you start to feel tired or "winded"/ short of breath Continue to monitor and write down on paper daily fasting blood sugars several times each week- the blood sugars you reported today are in good range Continue to work with home health team: PT  Continue your efforts to prevent falls- use your walker when you are walking      Plan: Telephone follow up appointment with care management team member scheduled for:  Wednesday February 18, 2022 at 2:15 pm The patient has been provided with contact information for the care management team and has been advised to call with any health related questions or concerns   [name], RN, BSN, [place] [phone]: direct office

## 2022-01-06 DIAGNOSIS — E1151 Type 2 diabetes mellitus with diabetic peripheral angiopathy without gangrene: Secondary | ICD-10-CM | POA: Diagnosis not present

## 2022-01-06 DIAGNOSIS — I890 Lymphedema, not elsewhere classified: Secondary | ICD-10-CM | POA: Diagnosis not present

## 2022-01-06 DIAGNOSIS — I110 Hypertensive heart disease with heart failure: Secondary | ICD-10-CM | POA: Diagnosis not present

## 2022-01-06 DIAGNOSIS — I872 Venous insufficiency (chronic) (peripheral): Secondary | ICD-10-CM | POA: Diagnosis not present

## 2022-01-06 DIAGNOSIS — I5033 Acute on chronic diastolic (congestive) heart failure: Secondary | ICD-10-CM | POA: Diagnosis not present

## 2022-01-08 DIAGNOSIS — I5033 Acute on chronic diastolic (congestive) heart failure: Secondary | ICD-10-CM | POA: Diagnosis not present

## 2022-01-08 DIAGNOSIS — I110 Hypertensive heart disease with heart failure: Secondary | ICD-10-CM | POA: Diagnosis not present

## 2022-01-08 DIAGNOSIS — I890 Lymphedema, not elsewhere classified: Secondary | ICD-10-CM | POA: Diagnosis not present

## 2022-01-08 DIAGNOSIS — E1151 Type 2 diabetes mellitus with diabetic peripheral angiopathy without gangrene: Secondary | ICD-10-CM | POA: Diagnosis not present

## 2022-01-08 DIAGNOSIS — I872 Venous insufficiency (chronic) (peripheral): Secondary | ICD-10-CM | POA: Diagnosis not present

## 2022-01-09 DIAGNOSIS — R69 Illness, unspecified: Secondary | ICD-10-CM | POA: Diagnosis not present

## 2022-01-12 DIAGNOSIS — M170 Bilateral primary osteoarthritis of knee: Secondary | ICD-10-CM | POA: Diagnosis not present

## 2022-01-13 DIAGNOSIS — M545 Low back pain, unspecified: Secondary | ICD-10-CM | POA: Diagnosis not present

## 2022-01-13 DIAGNOSIS — J321 Chronic frontal sinusitis: Secondary | ICD-10-CM | POA: Diagnosis not present

## 2022-01-13 DIAGNOSIS — M25551 Pain in right hip: Secondary | ICD-10-CM | POA: Diagnosis not present

## 2022-01-13 DIAGNOSIS — M542 Cervicalgia: Secondary | ICD-10-CM | POA: Diagnosis not present

## 2022-01-13 DIAGNOSIS — M25512 Pain in left shoulder: Secondary | ICD-10-CM | POA: Diagnosis not present

## 2022-01-13 DIAGNOSIS — M1711 Unilateral primary osteoarthritis, right knee: Secondary | ICD-10-CM | POA: Diagnosis not present

## 2022-01-13 DIAGNOSIS — G894 Chronic pain syndrome: Secondary | ICD-10-CM | POA: Diagnosis not present

## 2022-01-13 DIAGNOSIS — M79661 Pain in right lower leg: Secondary | ICD-10-CM | POA: Diagnosis not present

## 2022-01-13 DIAGNOSIS — M25552 Pain in left hip: Secondary | ICD-10-CM | POA: Diagnosis not present

## 2022-01-13 DIAGNOSIS — Z79891 Long term (current) use of opiate analgesic: Secondary | ICD-10-CM | POA: Diagnosis not present

## 2022-01-13 DIAGNOSIS — M25562 Pain in left knee: Secondary | ICD-10-CM | POA: Diagnosis not present

## 2022-01-13 DIAGNOSIS — M25561 Pain in right knee: Secondary | ICD-10-CM | POA: Diagnosis not present

## 2022-01-14 DIAGNOSIS — E1151 Type 2 diabetes mellitus with diabetic peripheral angiopathy without gangrene: Secondary | ICD-10-CM | POA: Diagnosis not present

## 2022-01-14 DIAGNOSIS — I890 Lymphedema, not elsewhere classified: Secondary | ICD-10-CM | POA: Diagnosis not present

## 2022-01-14 DIAGNOSIS — I110 Hypertensive heart disease with heart failure: Secondary | ICD-10-CM | POA: Diagnosis not present

## 2022-01-14 DIAGNOSIS — I5033 Acute on chronic diastolic (congestive) heart failure: Secondary | ICD-10-CM | POA: Diagnosis not present

## 2022-01-14 DIAGNOSIS — I872 Venous insufficiency (chronic) (peripheral): Secondary | ICD-10-CM | POA: Diagnosis not present

## 2022-01-16 ENCOUNTER — Other Ambulatory Visit: Payer: Self-pay

## 2022-01-16 ENCOUNTER — Telehealth: Payer: Medicare Other

## 2022-01-16 ENCOUNTER — Telehealth: Payer: Self-pay | Admitting: Cardiology

## 2022-01-16 DIAGNOSIS — I10 Essential (primary) hypertension: Secondary | ICD-10-CM

## 2022-01-16 DIAGNOSIS — N183 Chronic kidney disease, stage 3 unspecified: Secondary | ICD-10-CM

## 2022-01-16 DIAGNOSIS — I890 Lymphedema, not elsewhere classified: Secondary | ICD-10-CM | POA: Diagnosis not present

## 2022-01-16 DIAGNOSIS — E1151 Type 2 diabetes mellitus with diabetic peripheral angiopathy without gangrene: Secondary | ICD-10-CM | POA: Diagnosis not present

## 2022-01-16 DIAGNOSIS — I5033 Acute on chronic diastolic (congestive) heart failure: Secondary | ICD-10-CM | POA: Diagnosis not present

## 2022-01-16 DIAGNOSIS — I872 Venous insufficiency (chronic) (peripheral): Secondary | ICD-10-CM | POA: Diagnosis not present

## 2022-01-16 DIAGNOSIS — I110 Hypertensive heart disease with heart failure: Secondary | ICD-10-CM | POA: Diagnosis not present

## 2022-01-16 NOTE — Telephone Encounter (Signed)
Spoke with patient who reports weight gain of 6 pounds this week, with edema from thighs to feet bilaterally. She has sob on exertion, which she states is not new.  She is uncomfortable with the edema. She is taking medications as prescribed. Please advise.

## 2022-01-16 NOTE — Progress Notes (Deleted)
Chronic Care Management Pharmacy Note  01/16/2022 Name:  Maria [name] MRN:  [id number] DOB:  1956-01-06  Summary: - Patient reports that she recently has stopped losartan due to hypotension (blood pressures averaging 110/40's) - since stopping reports that blood pressure has been averaging 120/60's with a HR in the 60's - Patient reports that she has been monitoring her weights, most recently has been averaging ~367 - dry weight noted to be 358lbs -Patient reports that for the most part she is compliant with her diuretic dosing, if she has an appointment that day she will only take 92m of torsemide in the AM, and 88min the PM - due to issues with incontinence if she takes the full dose  -Continues with low sodium diet, also does not use NSAIDs -Notes that BP this AM was 104/42 - reports that typically BP is higher - denies any symptoms of hypotension at this time  -reports to cramping / shaking in her hands at times - has been progressively worsening recently  -Has stopped taking metformin as she has been monitoring BG  and has been controlled averaging 94-120 without metformin  Recommendations/Changes made from today's visit: -stressed to patient importance of adherence with diuretics - reviewed with patient that dry weight is around 358lbs so she is currently up about 10 lbs from her dry weight - Advised to reach out to cardiology regarding diuretic administration - currently taking metolazone once weekly along with torsemide 8041mID  -Patient to continue to monitor weight, blood pressure, and blood sugar once daily, patient to make office aware should any of these be uncontrolled   Subjective: Maria [name] is an [age] year old female who is a primary patient of Maria [name].  The CCM team was consulted for assistance with disease management and care coordination needs.    Engaged with patient by telephone for return visit in response to provider referral for pharmacy case  management and/or care coordination services.   Consent to Services:  The patient was given the following information about Chronic Care Management services today, agreed to services, and gave verbal consent: 1. CCM service includes personalized support from designated clinical staff supervised by the primary care provider, including individualized plan of care and coordination with other care providers 2. 24/7 contact phone numbers for assistance for urgent and routine care needs. 3. Service will only be billed when office clinical staff spend 20 minutes or more in a month to coordinate care. 4. Only one practitioner may furnish and bill the service in a calendar month. 5.The patient may stop CCM services at any time (effective at the end of the month) by phone call to the office staff. 6. The patient will be responsible for cost sharing (co-pay) of up to 20% of the service fee (after annual deductible is met). Patient agreed to services and consent obtained.  Patient Care Team: JohBiagio BorgD as PCP - General (Internal Medicine) HocMinus BreedingD as PCP - Cardiology (Cardiology) MarHelayne SeminoleD as Consulting Physician (Otolaryngology) ShaArtis DelayD as Referring Physician (Internal Medicine) TouKnox RoyaltyN as Case Manager HocMinus BreedingD as Consulting Physician (Cardiology)  Recent office visits:  12/30/2021 - Dr. [facility] - trial of ozempic for DM and weight loss    Recent consult visits:  12/26/2020 - Dr. HocPercival SpanishCardiology - hopsital follow up for CHF exacerbation - continued current medications - follow up in 6 weeks  Recent Hospital Visits 12/18/2021-12/21/2021 - Hospital Admission - CHF exacerbation  - discharged on torsemide 54m BID, losartan and metolazone held   Objective:  Lab Results  Component Value Date   CREATININE 1.18 (H) 12/26/2021   BUN 15 12/26/2021   GFR 53.82 (L) 07/07/2021   GFRNONAA 52 (L) 12/21/2021   GFRAA >60  08/06/2020   NA 142 12/26/2021   K 4.1 12/26/2021   CALCIUM 9.4 12/26/2021   CO2 30 (H) 12/26/2021   GLUCOSE 79 12/26/2021    Lab Results  Component Value Date/Time   HGBA1C 6.8 (H) 12/19/2021 09:48 AM   HGBA1C 6.1 (H) 09/20/2021 05:18 AM   GFR 53.82 (L) 07/07/2021 03:10 PM   GFR 50.54 (L) 04/18/2021 12:18 PM    Last diabetic Eye exam:  Lab Results  Component Value Date/Time   HMDIABEYEEXA No Retinopathy 05/14/2021 11:13 AM    Last diabetic Foot exam:  No results found for: HMDIABFOOTEX   Lab Results  Component Value Date   CHOL 147 07/07/2021   HDL 43.30 07/07/2021   LDLCALC 76 07/07/2021   LDLDIRECT 127.0 01/25/2019   TRIG 140.0 07/07/2021   CHOLHDL 3 07/07/2021    Hepatic Function Latest Ref Rng & Units 10/16/2021 09/20/2021 09/19/2021  Total Protein 6.5 - 8.1 g/dL 7.1 7.0 7.3  Albumin 3.5 - 5.0 g/dL 4.0 3.7 3.8  AST 15 - 41 U/L 10(L) 17 19  ALT 0 - 44 U/L '11 14 15  ' Alk Phosphatase 38 - 126 U/L 75 62 69  Total Bilirubin 0.3 - 1.2 mg/dL 0.6 1.2 1.1  Bilirubin, Direct 0.0 - 0.3 mg/dL - - -    Lab Results  Component Value Date/Time   TSH 1.45 12/31/2020 02:04 PM   TSH 1.08 01/31/2020 04:39 PM    CBC Latest Ref Rng & Units 12/19/2021 12/18/2021 10/16/2021  WBC 4.0 - 10.5 K/uL 7.0 9.5 11.5(H)  Hemoglobin 12.0 - 15.0 g/dL 10.9(L) 12.0 12.3  Hematocrit 36.0 - 46.0 % 31.9(L) 35.8(L) 38.5  Platelets 150 - 400 K/uL 242 301 288    Lab Results  Component Value Date/Time   VD25OH 26.86 (L) 07/07/2021 03:10 PM   VD25OH 24.56 (L) 12/31/2020 02:04 PM    Clinical ASCVD: No  The 10-year ASCVD risk score (Arnett DK, et al., 2019) is: 14.4%   Values used to calculate the score:     Age: [age]years     Sex: Female     Is Non-Hispanic African American: No     Diabetic: Yes     Tobacco smoker: No     Systolic Blood Pressure: 1811mmHg     Is BP treated: Yes     HDL Cholesterol: 43.3 mg/dL     Total Cholesterol: 147 mg/dL    [facility] 01/05/2022  12/30/2021 04/25/2021  Decreased Interest 0 0 0  Down, Depressed, Hopeless 0 0 0  PHQ - 2 Score 0 0 0  Altered sleeping - - -  Tired, decreased energy - - -  Change in appetite - - -  Feeling bad or failure about yourself  - - -  Trouble concentrating - - -  Moving slowly or fidgety/restless - - -  Suicidal thoughts - - -  PHQ-9 Score - - -  Difficult doing work/chores - - -  Some recent data might be hidden     Social History   Tobacco Use  Smoking Status Former   Years: 30.00   Types: Cigarettes   Quit date:  11/08/2008   Years since quitting: 13.1  Smokeless Tobacco Never  Tobacco Comments   quit 10/09   BP Readings from Last 3 Encounters:  12/30/21 (!) 142/70  12/30/21 (!) 142/70  12/26/21 120/76   Pulse Readings from Last 3 Encounters:  12/30/21 75  12/30/21 75  12/26/21 81   Wt Readings from Last 3 Encounters:  12/30/21 (!) 371 lb (168.3 kg)  12/30/21 (!) 371 lb (168.3 kg)  12/26/21 (!) 365 lb 12.8 oz (165.9 kg)   BMI Readings from Last 3 Encounters:  12/30/21 65.72 kg/m  12/30/21 65.72 kg/m  12/26/21 64.80 kg/m    Assessment/Interventions: Review of patient past medical history, allergies, medications, health status, including review of consultants reports, laboratory and other test data, was performed as part of comprehensive evaluation and provision of chronic care management services.   SDOH:  (Social Determinants of Health) assessments and interventions performed: Yes  SDOH Screenings   Alcohol Screen: Low Risk    Last Alcohol Screening Score (AUDIT): 0  Depression (PHQ2-9): Low Risk    PHQ-2 Score: 0  Financial Resource Strain: Medium Risk   Difficulty of Paying Living Expenses: Somewhat hard  Food Insecurity: No Food Insecurity   Worried About Charity fundraiser in the Last Year: Never true   Ran Out of Food in the Last Year: Never true  Housing: Low Risk    Last Housing Risk Score: 0  Physical Activity: Inactive   Days of Exercise per  Week: 0 days   Minutes of Exercise per Session: 0 min  Social Connections: Socially Isolated   Frequency of Communication with Friends and Family: More than three times a week   Frequency of Social Gatherings with Friends and Family: More than three times a week   Attends Religious Services: Never   Marine scientist or Organizations: No   Attends Music therapist: Never   Marital Status: Never married  Stress: No Stress Concern Present   Feeling of Stress : Not at all  Tobacco Use: Medium Risk   Smoking Tobacco Use: Former   Smokeless Tobacco Use: Never   Passive Exposure: Not on file  Transportation Needs: No Transportation Needs   Lack of Transportation (Medical): No   Lack of Transportation (Non-Medical): No    CCM Care Plan  Allergies  Allergen Reactions   Amoxicillin-Pot Clavulanate Nausea And Vomiting    Projectile vomiting Did it involve swelling of the face/tongue/throat, SOB, or low BP? No Did it involve sudden or severe rash/hives, skin peeling, or any reaction on the inside of your mouth or nose? No Did you need to seek medical attention at a hospital or doctor's office? No When did it last happen?      20-30 years If all above answers are NO, may proceed with cephalosporin use.    Adhesive [Tape] Other (See Comments)    Tears skin off - use paper tape    Codeine Other (See Comments)    hallucinations   Crestor [Rosuvastatin Calcium]     Severe muscle cramps   Morphine And Related Nausea And Vomiting and Other (See Comments)    Migraine headaches   Vicodin [Hydrocodone-Acetaminophen] Other (See Comments)    hallucinations   Latex Rash    Medications Reviewed Today     Reviewed by Biagio Borg, MD (Physician) on 01/04/22 at [place]  Med List Status: <None>   Medication Order Taking? Sig Documenting Provider Last Dose Status Informant  Accu-Chek Softclix Lancets lancets  [id number]  Use as directed up to 4 times daily  Patient taking  differently: 1 each by Other route in the morning, at noon, in the evening, and at bedtime.   Elodia Florence., MD  Active Self  acetaminophen (TYLENOL) 325 MG tablet [id number]  Take 650 mg by mouth every 6 (six) hours as needed for headache or fever (pain). [provider]  Active Self  albuterol (PROAIR HFA) 108 (90 Base) MCG/ACT inhaler [id number]  Inhale 2 puffs into the lungs every 6 (six) hours as needed for wheezing or shortness of breath. Lucretia Kern, DO  Active Self           Med Note [place] May 01, 2021  2:27 PM)    Blood Glucose Monitoring Suppl (ACCU-CHEK GUIDE) w/Device KIT [id number]  Use as directed  Patient taking differently: 1 each by Other route as directed.   Elodia Florence., MD  Active Self  clonazePAM Bobbye [place]) 1 MG tablet [id number]  Take 1 tablet (1 mg total) by mouth 2 (two) times daily as needed for anxiety. Biagio Borg, MD  Active   cyclobenzaprine (FLEXERIL) 10 MG tablet [id number]  TAKE 1 TABLET BY MOUTH 2 TIMES A DAY AS NEEDED FOR MUSCLE SPASMS Biagio Borg, MD  Active Self           Med Note [place] May 01, 2021  2:28 PM)      Discontinued 03/09/12 1633   Fexofenadine HCl (ALLEGRA PO) [id number]  Take 180 mg by mouth daily as needed (allergies). [provider]  Active Self  glucose blood (ACCU-CHEK GUIDE) test strip [id number]  Use as instructed up to 4 times daily Elodia Florence., MD  Active Self  Metamucil Fiber CHEW [id number]  Chew 3 tablets by mouth at bedtime. [provider]  Active Self  metFORMIN (GLUCOPHAGE) 500 MG tablet [id number] No Take 1 tablet (500 mg total) by mouth 2 (two) times daily with a meal.  Patient not taking: Reported on 12/30/2021   Biagio Borg, MD Not Taking Active Self  metoprolol tartrate (LOPRESSOR) 25 MG tablet [id number]  Take 25 mg by mouth 2 (two) times daily. [provider]  Active Self           Med Note Jimmey Ralph, Alinda Dooms Dec 18, 2021  5:17 PM) LF at [facility] on 10/28/21. 90 Day Supply   NARCAN 4 MG/0.1ML LIQD nasal spray kit [id number]  Place 0.4 mg into the nose daily as needed (opioid overdose). [provider]  Active Self           Med Note Wynetta Emery   Thu May 01, 2021  2:31 PM)    ondansetron (ZOFRAN ODT) 4 MG disintegrating tablet [id number]  Take 1 tablet (4 mg total) by mouth every 4 (four) hours as needed for nausea or vomiting. Charlesetta Shanks, MD  Active Self           Med Note [place] May 01, 2021  2:31 PM)    Oxycodone HCl 10 MG TABS [id number]  Take 1 tablet (10 mg total) 4 (four) times daily by mouth. Per [facility]  Patient taking differently: Take 10 mg by mouth every 4 (four) hours as needed (pain). Per [facility]   Biagio Borg, MD  Active Self  Med Note Isaac Laud I   Thu Dec 18, 2021  5:23 PM) LF at [facility] on 12/08/21. 30 Day Supply   pantoprazole (PROTONIX) 40 MG tablet [id number]  TAKE ONE TABLET BY MOUTH DAILY  Patient taking differently: Take 40 mg by mouth daily.   Biagio Borg, MD  Active Self           Med Note Jimmey Ralph, [facility] I   Thu Dec 18, 2021  5:18 PM) LF at [facility] on 11/06/21. 90 Day Supply   Polyethyl Glycol-Propyl Glycol 0.4-0.3 % SOLN [id number]  Apply 1 drop to eye daily as needed (dry eyes). [provider]  Active Self  polyethylene glycol powder (GLYCOLAX/MIRALAX) 17 GM/SCOOP powder [id number]  Take 17 g by mouth daily as needed for mild constipation. Elodia Florence., MD  Active Self           Med Note [facility] [place] May 01, 2021  2:32 PM)    potassium chloride SA (KLOR-CON) 20 MEQ tablet [id number]  Take 1 tablet (20 mEq total) by mouth 2 (two) times daily. Minus Breeding, MD  Expired 12/18/21 2359 Self  rOPINIRole (REQUIP) 1 MG tablet [id number]  Take 1 tablet (1 mg total) by mouth 3 (three) times daily. Biagio Borg, MD  Active Self            Med Note Jimmey Ralph, [facility] I   Thu Dec 18, 2021  5:24 PM) LF at [facility] on 11/24/21. 30 Day Supply   torsemide (DEMADEX) 20 MG tablet [id number]  Take 2 tablets in the morning and 1 tablet in the afternoon. Minus Breeding, MD  Active             Patient Active Problem List   Diagnosis Date Noted   Dyspnea 12/19/2021   CHF exacerbation ([place]) 12/18/2021   Leg swelling 11/20/2021   Bilateral knee pain 10/29/2021   Hypernatremia 09/24/2021   CKD (chronic kidney disease), stage IIIa ([place]) 09/24/2021   Sacral decubitus ulcer, stage II ([place]) 09/20/2021   Acute on chronic diastolic CHF (congestive heart failure) ([place]) 09/19/2021   Nail disorder 07/07/2021   Pre-ulcerative corn or callous 07/07/2021   Rash 75/91/6384   Diastolic congestive heart failure ([place]) 03/17/2021   Diabetes mellitus type 2 in obese ([place]) 03/11/2021   Vitamin D deficiency 01/01/2021   Medial epicondylitis 12/31/2020   Aortic atherosclerosis ([place]) 66/59/9357   Umbilical hernia 01/77/9390   Constipation 08/05/2020   Arthritis 06/12/2020   Hoarseness 05/01/2020   Vitamin B 12 deficiency 01/31/2020   Rosacea 01/31/2020   Neck swelling 01/31/2020   History of colonic polyps    Benign neoplasm of [place]    Nausea 08/31/2019   Throat pain 07/26/2019   HLD (hyperlipidemia) 05/24/2019   Leg cramps 05/24/2019   Lump in neck 05/24/2019   Left thyroid nodule 01/25/2019   Jerking movements of extremities 07/20/2018   Balance disorder 07/20/2018   Right knee pain 03/21/2018   OSA (obstructive sleep apnea) 03/21/2018   Oxygen desaturation 03/21/2018   Urinary symptom or sign 03/21/2018   Acquired lymphedema 01/25/2018   Gait disorder 01/25/2018   Peripheral edema 01/16/2018   SOB (shortness of breath) 01/15/2018   Dysphonia 12/07/2017   Encounter for well adult exam with abnormal findings 11/08/2017   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   Lia polyp 08/11/2016   Abdominal pain, epigastric     Dysphagia    [place] cancer screening    ACE-inhibitor cough 12/05/2015  Restless legs syndrome 10/08/2015   Venous stasis dermatitis of both lower extremities 04/26/2015   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/17/2014   AR (allergic rhinitis) 10/17/2014   Fatty liver 10/17/2014   Chronic pain syndrome 09/19/2014   Post-traumatic osteoarthritis of both knees 09/19/2014   Spinal stenosis 09/19/2014   Depression, major, single episode, complete remission ([place]) 09/19/2014   Narcotic dependence ([place]) 09/19/2014   Spondylosis of 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 03/09/2012   Lumbar disc disease 03/09/2012   Chronic pain 03/09/2012   Morbid obesity ([place]) 06/17/2011   Chronic low back pain 12/28/2008   Anxiety state 12/06/2007   Depression 12/06/2007   Essential hypertension 12/06/2007   Gastroesophageal reflux disease without esophagitis 12/06/2007   LOW BACK PAIN 12/06/2007    Immunization History  Administered Date(s) Administered   Hepatitis B, ped/adol 06/03/2016, 07/17/2016   Influenza Split 10/13/2012   Influenza, Seasonal, Injecte, Preservative Fre 09/19/2014, 10/08/2015, 09/11/2016   Influenza,inj,Quad PF,6+ Mos 08/31/2013, 11/08/2017, 12/30/2018, 12/31/2020, 10/29/2021   Moderna SARS-COV2 Booster Vaccination 10/19/2020   Moderna Sars-Covid-2 Vaccination 09/21/2020   Pneumococcal Polysaccharide-23 08/18/2013, 10/29/2021   Td 12/21/2004   Tdap 05/01/2015, 11/08/2017    Conditions to be addressed/monitored:  Hyperlipidemia, Diabetes, Heart Failure, GERD, Anxiety, Allergic Rhinitis, Chronic Pain, Constipation, Allergic Rhinitis, and Restless Leg Syndrome   There are no care plans that you recently modified to display for this patient.       Medication Assistance: None required.  Patient affirms current coverage meets needs.   Patient's preferred pharmacy is:  [facility] [phone] [place], [place] - [place] [address] Phone: [phone] Fax: [phone]  [facility] [phone] [place], [place] [address] [place] [address] Phone: [phone] Fax: [place], [place] [address] [place] [address] Phone: [phone] Fax: [phone]  [place], [place] [place] [address] Phone: [phone] Fax: [phone]   Uses pill box? Yes Pt endorses 95-100% compliance  Care Plan and Follow Up Patient Decision:  Patient agrees to Care Plan and Follow-up.  Plan: Telephone follow up appointment with care management team member scheduled for:  3 months  and The patient has been provided with contact information for the care management team and has been advised to call with any health related questions or concerns.   Tomasa Blase, PharmD Clinical Pharmacist, [place]

## 2022-01-16 NOTE — Telephone Encounter (Signed)
Pt c/o swelling: STAT is pt has developed SOB within 24 hours  If swelling, where is the swelling located? Legs, thighs, knees and feet   How much weight have you gained and in what time span? 6 pounds in a week   Have you gained 3 pounds in a day or 5 pounds in a week? yes  Do you have a log of your daily weights (if so, list)? no  Are you currently taking a fluid pill? yes  Are you currently SOB? Only on exertion  Have you traveled recently? no

## 2022-01-16 NOTE — Telephone Encounter (Signed)
Order from Dr. Percival Spanish for patient to take demadex 60 mg in the morning and 60 mg in the afternoon with a BMET next week. This was explained to the patient, who repeated it back in her own words. Pt said she will come to [facility] for blood work next Thursday.

## 2022-01-19 DIAGNOSIS — I5033 Acute on chronic diastolic (congestive) heart failure: Secondary | ICD-10-CM | POA: Diagnosis not present

## 2022-01-19 DIAGNOSIS — I872 Venous insufficiency (chronic) (peripheral): Secondary | ICD-10-CM | POA: Diagnosis not present

## 2022-01-19 DIAGNOSIS — I890 Lymphedema, not elsewhere classified: Secondary | ICD-10-CM | POA: Diagnosis not present

## 2022-01-19 DIAGNOSIS — E1151 Type 2 diabetes mellitus with diabetic peripheral angiopathy without gangrene: Secondary | ICD-10-CM | POA: Diagnosis not present

## 2022-01-19 DIAGNOSIS — I110 Hypertensive heart disease with heart failure: Secondary | ICD-10-CM | POA: Diagnosis not present

## 2022-01-20 ENCOUNTER — Other Ambulatory Visit: Payer: Self-pay | Admitting: Internal Medicine

## 2022-01-20 DIAGNOSIS — E1169 Type 2 diabetes mellitus with other specified complication: Secondary | ICD-10-CM

## 2022-01-20 DIAGNOSIS — E669 Obesity, unspecified: Secondary | ICD-10-CM | POA: Diagnosis not present

## 2022-01-20 DIAGNOSIS — I5032 Chronic diastolic (congestive) heart failure: Secondary | ICD-10-CM

## 2022-01-20 DIAGNOSIS — N1831 Chronic kidney disease, stage 3a: Secondary | ICD-10-CM | POA: Diagnosis not present

## 2022-01-23 ENCOUNTER — Other Ambulatory Visit: Payer: Self-pay | Admitting: Cardiology

## 2022-01-23 DIAGNOSIS — I10 Essential (primary) hypertension: Secondary | ICD-10-CM | POA: Diagnosis not present

## 2022-01-23 DIAGNOSIS — N183 Chronic kidney disease, stage 3 unspecified: Secondary | ICD-10-CM | POA: Diagnosis not present

## 2022-01-24 LAB — BASIC METABOLIC PANEL
BUN/Creatinine Ratio: 18 (ref 12–28)
BUN: 21 mg/dL (ref 8–27)
CO2: 24 mmol/L (ref 20–29)
Calcium: 8.8 mg/dL (ref 8.7–10.3)
Chloride: 101 mmol/L (ref 96–106)
Creatinine, Ser: 1.16 mg/dL — ABNORMAL HIGH (ref 0.57–1.00)
Glucose: 110 mg/dL — ABNORMAL HIGH (ref 70–99)
Potassium: 4.5 mmol/L (ref 3.5–5.2)
Sodium: 142 mmol/L (ref 134–144)
eGFR: 53 mL/min/{1.73_m2} — ABNORMAL LOW (ref >59)

## 2022-01-26 ENCOUNTER — Other Ambulatory Visit: Payer: Self-pay

## 2022-01-26 DIAGNOSIS — Z79899 Other long term (current) drug therapy: Secondary | ICD-10-CM

## 2022-01-28 ENCOUNTER — Other Ambulatory Visit: Payer: Self-pay | Admitting: Internal Medicine

## 2022-01-28 DIAGNOSIS — I890 Lymphedema, not elsewhere classified: Secondary | ICD-10-CM | POA: Diagnosis not present

## 2022-01-28 DIAGNOSIS — I110 Hypertensive heart disease with heart failure: Secondary | ICD-10-CM | POA: Diagnosis not present

## 2022-01-28 DIAGNOSIS — I5033 Acute on chronic diastolic (congestive) heart failure: Secondary | ICD-10-CM | POA: Diagnosis not present

## 2022-01-28 DIAGNOSIS — E1151 Type 2 diabetes mellitus with diabetic peripheral angiopathy without gangrene: Secondary | ICD-10-CM | POA: Diagnosis not present

## 2022-01-28 DIAGNOSIS — I872 Venous insufficiency (chronic) (peripheral): Secondary | ICD-10-CM | POA: Diagnosis not present

## 2022-01-28 NOTE — Telephone Encounter (Signed)
Please refill as per office routine med refill policy (all routine meds to be refilled for 3 mo or monthly (per pt preference) up to one year from last visit, then month to month grace period for 3 mo, then further med refills will have to be denied) ? ?

## 2022-01-30 ENCOUNTER — Ambulatory Visit (INDEPENDENT_AMBULATORY_CARE_PROVIDER_SITE_OTHER): Payer: Medicare Other | Admitting: Internal Medicine

## 2022-01-30 ENCOUNTER — Other Ambulatory Visit: Payer: Self-pay

## 2022-01-30 VITALS — BP 134/72 | HR 75 | Temp 98.7°F | Ht 63.0 in | Wt 373.2 lb

## 2022-01-30 DIAGNOSIS — E538 Deficiency of other specified B group vitamins: Secondary | ICD-10-CM

## 2022-01-30 DIAGNOSIS — Z0001 Encounter for general adult medical examination with abnormal findings: Secondary | ICD-10-CM | POA: Diagnosis not present

## 2022-01-30 DIAGNOSIS — E782 Mixed hyperlipidemia: Secondary | ICD-10-CM | POA: Diagnosis not present

## 2022-01-30 DIAGNOSIS — E669 Obesity, unspecified: Secondary | ICD-10-CM

## 2022-01-30 DIAGNOSIS — F32A Depression, unspecified: Secondary | ICD-10-CM | POA: Diagnosis not present

## 2022-01-30 DIAGNOSIS — I5032 Chronic diastolic (congestive) heart failure: Secondary | ICD-10-CM | POA: Diagnosis not present

## 2022-01-30 DIAGNOSIS — F411 Generalized anxiety disorder: Secondary | ICD-10-CM

## 2022-01-30 DIAGNOSIS — I10 Essential (primary) hypertension: Secondary | ICD-10-CM

## 2022-01-30 DIAGNOSIS — E1169 Type 2 diabetes mellitus with other specified complication: Secondary | ICD-10-CM

## 2022-01-30 DIAGNOSIS — N1831 Chronic kidney disease, stage 3a: Secondary | ICD-10-CM | POA: Diagnosis not present

## 2022-01-30 DIAGNOSIS — E559 Vitamin D deficiency, unspecified: Secondary | ICD-10-CM | POA: Diagnosis not present

## 2022-01-30 DIAGNOSIS — I700 Atherosclerosis of aorta: Secondary | ICD-10-CM | POA: Diagnosis not present

## 2022-01-30 LAB — BASIC METABOLIC PANEL
BUN: 15 mg/dL (ref 6–23)
CO2: 36 mEq/L — ABNORMAL HIGH (ref 19–32)
Calcium: 9.3 mg/dL (ref 8.4–10.5)
Chloride: 99 mEq/L (ref 96–112)
Creatinine, Ser: 1.02 mg/dL (ref 0.40–1.20)
GFR: 58.05 mL/min — ABNORMAL LOW (ref >60.00)
Glucose, Bld: 99 mg/dL (ref 70–99)
Potassium: 4.2 mEq/L (ref 3.5–5.1)
Sodium: 140 mEq/L (ref 135–145)

## 2022-01-30 LAB — URINALYSIS, ROUTINE W REFLEX MICROSCOPIC
Bilirubin Urine: NEGATIVE
Ketones, ur: NEGATIVE
Nitrite: NEGATIVE
Specific Gravity, Urine: <=1.005 — AB (ref 1.000–1.030)
Total Protein, Urine: NEGATIVE
Urine Glucose: NEGATIVE
Urobilinogen, UA: 0.2 (ref 0.0–1.0)
pH: 6 (ref 5.0–8.0)

## 2022-01-30 LAB — CBC WITH DIFFERENTIAL/PLATELET
Basophils Absolute: 0.1 10*3/uL (ref 0.0–0.1)
Basophils Relative: 0.5 % (ref 0.0–3.0)
Eosinophils Absolute: 0.2 10*3/uL (ref 0.0–0.7)
Eosinophils Relative: 1.9 % (ref 0.0–5.0)
HCT: 36.8 % (ref 36.0–46.0)
Hemoglobin: 12.2 g/dL (ref 12.0–15.0)
Lymphocytes Relative: 28.5 % (ref 12.0–46.0)
Lymphs Abs: 3.1 10*3/uL (ref 0.7–4.0)
MCHC: 33.1 g/dL (ref 30.0–36.0)
MCV: 88.9 fl (ref 78.0–100.0)
Monocytes Absolute: 0.9 10*3/uL (ref 0.1–1.0)
Monocytes Relative: 8.5 % (ref 3.0–12.0)
Neutro Abs: 6.6 10*3/uL (ref 1.4–7.7)
Neutrophils Relative %: 60.6 % (ref 43.0–77.0)
Platelets: 270 10*3/uL (ref 150.0–400.0)
RBC: 4.14 Mil/uL (ref 3.87–5.11)
RDW: 14.3 % (ref 11.5–15.5)
WBC: 10.9 10*3/uL — ABNORMAL HIGH (ref 4.0–10.5)

## 2022-01-30 LAB — HEPATIC FUNCTION PANEL
ALT: 15 U/L (ref 0–35)
AST: 13 U/L (ref 0–37)
Albumin: 3.8 g/dL (ref 3.5–5.2)
Alkaline Phosphatase: 78 U/L (ref 39–117)
Bilirubin, Direct: 0.1 mg/dL (ref 0.0–0.3)
Total Bilirubin: 0.7 mg/dL (ref 0.2–1.2)
Total Protein: 6.9 g/dL (ref 6.0–8.3)

## 2022-01-30 LAB — MICROALBUMIN / CREATININE URINE RATIO
Creatinine,U: 53.8 mg/dL
Microalb Creat Ratio: 1.4 mg/g (ref 0.0–30.0)
Microalb, Ur: 0.8 mg/dL (ref 0.0–1.9)

## 2022-01-30 LAB — TSH: TSH: 3.19 u[IU]/mL (ref 0.35–5.50)

## 2022-01-30 LAB — LIPID PANEL
Cholesterol: 126 mg/dL (ref 0–200)
HDL: 45.4 mg/dL (ref >39.00)
LDL Cholesterol: 56 mg/dL (ref 0–99)
NonHDL: 80.34
Total CHOL/HDL Ratio: 3
Triglycerides: 120 mg/dL (ref 0.0–149.0)
VLDL: 24 mg/dL (ref 0.0–40.0)

## 2022-01-30 LAB — VITAMIN D 25 HYDROXY (VIT D DEFICIENCY, FRACTURES): VITD: 40.39 ng/mL (ref 30.00–100.00)

## 2022-01-30 LAB — HEMOGLOBIN A1C: Hgb A1c MFr Bld: 6.9 % — ABNORMAL HIGH (ref 4.6–6.5)

## 2022-01-30 LAB — VITAMIN B12: Vitamin B-12: 239 pg/mL (ref 211–911)

## 2022-01-30 MED ORDER — CITALOPRAM HYDROBROMIDE 20 MG PO TABS: 20.0000 mg | ORAL_TABLET | Freq: Every day | ORAL | 3 refills | Status: DC

## 2022-01-30 MED ORDER — SEMAGLUTIDE (1 MG/DOSE) 4 MG/3ML ~~LOC~~ SOPN: 1.0000 mg | PEN_INJECTOR | SUBCUTANEOUS | 11 refills | Status: DC

## 2022-01-30 NOTE — Progress Notes (Signed)
Patient ID: Maria [name], female   DOB: May 16, 1956, 66 y.o.   MRN: [id number]         Chief Complaint:: wellness exam and Follow-up and Medication Management (Pt wants to talk about start zoloft again )  And chronic diastolic CHF, dm, ckd, T46 and D deficiency       HPI:  Maria [name] is a 66 y.o. female here for wellness exam; plans to have f/u mammogram at [facility] soon. ; declines covid booster, shingrix  for now, o/w up to date                        Also c/o worsening depressoin anxiety but no SI or HI, asks for med tx and referral counseling. Denies suicidal ideation, or panic; has ongoing anxiety, not increased recently.  Pt denies chest pain, increased sob or doe, wheezing, orthopnea, PND, increased LE swelling, palpitations, dizziness or syncope.  Pt denies polydipsia, polyuria, or new focal neuro s/s.  Also remains very difficult to lose fat wt with persistent bilateral knee pain.  Not taking B12 or Vit D.    Wt Readings from Last 3 Encounters:  01/30/22 (!) 373 lb 4 oz (169.3 kg)  12/30/21 (!) 371 lb (168.3 kg)  12/30/21 (!) 371 lb (168.3 kg)   BP Readings from Last 3 Encounters:  01/30/22 134/72  12/30/21 (!) 142/70  12/30/21 (!) 142/70   Immunization History  Administered Date(s) Administered   Hepatitis B, ped/adol 06/03/2016, 07/17/2016   Influenza Split 10/13/2012   Influenza, Seasonal, Injecte, Preservative Fre 09/19/2014, 10/08/2015, 09/11/2016   Influenza,inj,Quad PF,6+ Mos 08/31/2013, 11/08/2017, 12/30/2018, 12/31/2020, 10/29/2021   Moderna SARS-COV2 Booster Vaccination 10/19/2020   Moderna Sars-Covid-2 Vaccination 09/21/2020   Pneumococcal Polysaccharide-23 08/18/2013, 10/29/2021   Td 12/21/2004   Tdap 05/01/2015, 11/08/2017   There are no preventive care reminders to display for this patient.     Past Medical History:  Diagnosis Date   Acute lymphadenitis 2011   ALLERGIC RHINITIS 08/10/2007   ANXIETY 12/06/2007   Atherosclerotic peripheral vascular  disease ([place]) 06/13/2013   Aorta on CT June 2014   Cellulitis 2011   Cervical disc disease 03/09/2012   CHF (congestive heart failure) ([place])    Chronic pain 03/09/2012   DEPRESSION 12/06/2007   Diabetes ([place])    DIVERTICULOSIS, [place] 12/06/2007   GERD 12/06/2007   Hepatitis    age 15 hepatitis A   HLD (hyperlipidemia) 05/24/2019   HYPERTENSION 12/06/2007   Lumbar disc disease 03/09/2012   Mesenteric adenitis    MRSA 2006   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 2010   [place]  may 24th 2010   COLONOSCOPY WITH PROPOFOL N/A 07/10/2016   Procedure: COLONOSCOPY WITH PROPOFOL;  Surgeon: Manus Gunning, MD;  Location: [facility];  Service: Gastroenterology;  Laterality: N/A;   COLONOSCOPY WITH PROPOFOL N/A 09/26/2019   Procedure: COLONOSCOPY WITH PROPOFOL;  Surgeon: Yetta Flock, MD;  Location: [facility];  Service: Gastroenterology;  Laterality: N/A;   ESOPHAGOGASTRODUODENOSCOPY (EGD) WITH PROPOFOL N/A 07/10/2016   Procedure: ESOPHAGOGASTRODUODENOSCOPY (EGD) WITH PROPOFOL;  Surgeon: Manus Gunning, MD;  Location: [facility];  Service: Gastroenterology;  Laterality: N/A;   POLYPECTOMY  09/26/2019   Procedure: POLYPECTOMY;  Surgeon: Yetta Flock,  MD;  Location: [facility];  Service: Gastroenterology;;   s/p ovary cyst     s/p right knee arthroscopy     Dr. Mardelle Matte ortho    reports that she quit smoking about 13 years ago. Her smoking use included cigarettes. She has never used smokeless tobacco. She reports that she does not drink alcohol and does not use drugs. family history includes Anxiety disorder in her sister; Asthma in her daughter and sister; Bipolar disorder in her daughter; Cancer in her maternal uncle and another family  member; Depression in her sister; Diabetes in her mother; Heart disease in her mother; Heart failure in her mother; Hypertension in her father and mother; Liver cancer in her paternal grandmother. Allergies  Allergen Reactions   Amoxicillin-Pot Clavulanate Nausea And Vomiting    Projectile vomiting Did it involve swelling of the face/tongue/throat, SOB, or low BP? No Did it involve sudden or severe rash/hives, skin peeling, or any reaction on the inside of your mouth or nose? No Did you need to seek medical attention at a hospital or doctor's office? No When did it last happen?      20-30 years If all above answers are NO, may proceed with cephalosporin use.    Adhesive [Tape] Other (See Comments)    Tears skin off - use paper tape    Codeine Other (See Comments)    hallucinations   Crestor [Rosuvastatin Calcium]     Severe muscle cramps   Morphine And Related Nausea And Vomiting and Other (See Comments)    Migraine headaches   Vicodin [Hydrocodone-Acetaminophen] Other (See Comments)    hallucinations   Latex Rash   Current Outpatient Medications on File Prior to Visit  Medication Sig Dispense Refill   Accu-Chek Softclix Lancets lancets Use as directed up to 4 times daily (Patient taking differently: 1 each by Other route in the morning, at noon, in the evening, and at bedtime.) 100 each 5   acetaminophen (TYLENOL) 325 MG tablet Take 650 mg by mouth every 6 (six) hours as needed for headache or fever (pain).     albuterol (PROAIR HFA) 108 (90 Base) MCG/ACT inhaler Inhale 2 puffs into the lungs every 6 (six) hours as needed for wheezing or shortness of breath. 1 each 0   Blood Glucose Monitoring Suppl (ACCU-CHEK GUIDE) w/Device KIT Use as directed (Patient taking differently: 1 each by Other route as directed.) 1 kit 0   clonazePAM (KLONOPIN) 1 MG tablet Take 1 tablet (1 mg total) by mouth 2 (two) times daily as needed for anxiety. 60 tablet 5   cyclobenzaprine (FLEXERIL) 10 MG tablet  TAKE 1 TABLET BY MOUTH 2 TIMES A DAY AS NEEDED FOR MUSCLE SPASMS 180 tablet 1   Fexofenadine HCl (ALLEGRA PO) Take 180 mg by mouth daily as needed (allergies).     glucose blood (ACCU-CHEK GUIDE) test strip Use as instructed up to 4 times daily 100 each 12   Metamucil Fiber CHEW Chew 3 tablets by mouth at bedtime.     metoprolol tartrate (LOPRESSOR) 25 MG tablet Take 25 mg by mouth 2 (two) times daily.     NARCAN 4 MG/0.1ML LIQD nasal spray kit Place 0.4 mg into the nose daily as needed (opioid overdose).     ondansetron (ZOFRAN ODT) 4 MG disintegrating tablet Take 1 tablet (4 mg total) by mouth every 4 (four) hours as needed for nausea or vomiting. 20 tablet 0   Oxycodone HCl 10 MG TABS Take 1 tablet (10 mg total)  4 (four) times daily by mouth. Per [facility] (Patient taking differently: Take 10 mg by mouth every 4 (four) hours as needed (pain). Per [facility]) 30 tablet 0   pantoprazole (PROTONIX) 40 MG tablet TAKE ONE TABLET BY MOUTH DAILY 90 tablet 2   Polyethyl Glycol-Propyl Glycol 0.4-0.3 % SOLN Apply 1 drop to eye daily as needed (dry eyes).     polyethylene glycol powder (GLYCOLAX/MIRALAX) 17 GM/SCOOP powder Take 17 g by mouth daily as needed for mild constipation. 510 g 0   rOPINIRole (REQUIP) 1 MG tablet Take 1 tablet (1 mg total) by mouth 3 (three) times daily. 90 tablet 5   torsemide (DEMADEX) 20 MG tablet Take 2 tablets in the morning and 1 tablet in the afternoon. 180 tablet 1   metFORMIN (GLUCOPHAGE) 500 MG tablet Take 1 tablet (500 mg total) by mouth 2 (two) times daily with a meal. (Patient not taking: Reported on 12/30/2021) 180 tablet 2   potassium chloride SA (KLOR-CON) 20 MEQ tablet Take 1 tablet (20 mEq total) by mouth 2 (two) times daily. 180 tablet 3   [DISCONTINUED] DULoxetine (CYMBALTA) 60 MG capsule Take 60 mg by mouth daily.       No current facility-administered medications on file prior to visit.        ROS:  All others reviewed and  negative.  Objective        PE:  BP 134/72    Pulse 75    Temp 98.7 F (37.1 C) (Oral)    Ht _0  (1.6 m)    Wt (!) 373 lb 4 oz (169.3 kg)    SpO2 98%    BMI 66.12 kg/m                 Constitutional: Pt appears in NAD               HENT: Head: NCAT.                Right Ear: External ear normal.                 Left Ear: External ear normal.                Eyes: . Pupils are equal, round, and reactive to light. Conjunctivae and EOM are normal               Nose: without d/c or deformity               Neck: Neck supple. Gross normal ROM               Cardiovascular: Normal rate and regular rhythm.                 Pulmonary/Chest: Effort normal and breath sounds without rales or wheezing.                Abd:  Soft, NT, ND, + BS, no organomegaly               Neurological: Pt is alert. At baseline orientation, motor grossly intact               Skin: Skin is warm. No rashes, no other new lesions, LE edema - 2-3+ chronic               Psychiatric: Pt behavior is normal without agitation   Micro: none  Cardiac tracings I have personally interpreted today:  none  Pertinent Radiological  findings (summarize): none   Lab Results  Component Value Date   WBC 10.9 (H) 01/30/2022   HGB 12.2 01/30/2022   HCT 36.8 01/30/2022   PLT 270.0 01/30/2022   GLUCOSE 99 01/30/2022   CHOL 126 01/30/2022   TRIG 120.0 01/30/2022   HDL 45.40 01/30/2022   LDLDIRECT 127.0 01/25/2019   LDLCALC 56 01/30/2022   ALT 15 01/30/2022   AST 13 01/30/2022   NA 140 01/30/2022   K 4.2 01/30/2022   CL 99 01/30/2022   CREATININE 1.02 01/30/2022   BUN 15 01/30/2022   CO2 36 (H) 01/30/2022   TSH 3.19 01/30/2022   INR 1.0 04/01/2021   HGBA1C 6.9 (H) 01/30/2022   MICROALBUR 0.8 01/30/2022   Assessment/Plan:  Maria [name] is a 66 y.o. White or Caucasian [1] female with  has a past medical history of Acute lymphadenitis (2011), ALLERGIC RHINITIS (08/10/2007), ANXIETY (12/06/2007), Atherosclerotic peripheral  vascular disease ([place]) (06/13/2013), Cellulitis (2011), Cervical disc disease (03/09/2012), CHF (congestive heart failure) ([place]), Chronic pain (03/09/2012), DEPRESSION (12/06/2007), Diabetes ([place]), DIVERTICULOSIS, [place] (12/06/2007), GERD (12/06/2007), Hepatitis, HLD (hyperlipidemia) (05/24/2019), HYPERTENSION (12/06/2007), Lumbar disc disease (03/09/2012), Mesenteric adenitis, MRSA (2006), 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[place]) (11/08/2017), and THORACIC/LUMBOSACRAL NEURITIS/RADICULITIS UNSPEC (12/28/2008).  Vitamin B 12 deficiency Lab Results  Component Value Date   VITAMINB12 244 07/07/2021   Low, to start oral replacement - b12 1000 mcg qd   Vitamin D deficiency Last vitamin D Lab Results  Component Value Date   VD25OH 26.86 (L) 07/07/2021   Low, to start oral replacement   Aortic atherosclerosis (HCC) Pt to continue low chol diet, excericse, declines statin for now  Diastolic congestive heart failure (HCC) Stable volume, cont current torsemide  Encounter for well adult exam with abnormal findings Age and sex appropriate education and counseling updated with regular exercise and diet Referrals for preventative services - pt will call for mammogram soon Immunizations addressed - declines covid booster, shiingrix Smoking counseling  - none needed Evidence for depression or other mood disorder - worsening anxiety/depression Most recent labs reviewed. I have personally reviewed and have noted: 1) the patient's medical and social history 2) The patient's current medications and supplements 3) The patient's height, weight, and BMI have been recorded in the chart   CKD (chronic kidney disease), stage IIIa (HCC) Lab Results  Component Value Date   CREATININE 1.02 01/30/2022   Stable overall, cont to avoid nephrotoxins   Depression Worsening recently, ok for celexa 20 qd start  Anxiety state Worsening recent per pt, ok for counseling referral  Diabetes mellitus type 2 in obese  [facility]) Lab Results  Component Value Date   HGBA1C 6.9 (H) 01/30/2022   Mild uncontrolled, pt to continue current medical treatment metformin, and add ozempic if ok with insurance to help with wt loss   Essential hypertension BP Readings from Last 3 Encounters:  01/30/22 134/72  12/30/21 (!) 142/70  12/30/21 (!) 142/70   Stable, pt to continue medical treatment lopressor   HLD (hyperlipidemia) Lab Results  Component Value Date   LDLCALC 56 01/30/2022   Stable, pt to continue current low chol diet   Morbid obesity (HCC) Uncontrolled, for add ozempic asd  Followup: No follow-ups on file.  Cathlean Cower, MD 01/31/2022 2:39 PM [facility]

## 2022-01-30 NOTE — Assessment & Plan Note (Signed)
Lab Results  Component Value Date   VITAMINB12 244 07/07/2021   Low, to start oral replacement - b12 1000 mcg qd

## 2022-01-30 NOTE — Patient Instructions (Addendum)
Please remember to have the mammogram done at the [facility].   Please take all new medication as prescribed - the celexa 20 mg per day  You will be contacted regarding the referral for: counseling  Please continue all other medications as before, and refills have been done if requested.  Please have the pharmacy call with any other refills you may need.  Please continue your efforts at being more active, low cholesterol diet, and weight control.  You are otherwise up to date with prevention measures today.  Please keep your appointments with your specialists as you may have planned  Please go to the LAB at the blood drawing area for the tests to be done  You will be contacted by phone if any changes need to be made immediately.  Otherwise, you will receive a letter about your results with an explanation, but please check with MyChart first.  Please remember to sign up for MyChart if you have not done so, as this will be important to you in the future with finding out test results, communicating by private email, and scheduling acute appointments online when needed.  Please make an Appointment to return in 6 months, or sooner if needed

## 2022-01-30 NOTE — Assessment & Plan Note (Signed)
Last vitamin D Lab Results  Component Value Date   VD25OH 26.86 (L) 07/07/2021   Low, to start oral replacement

## 2022-01-31 NOTE — Assessment & Plan Note (Signed)
Age and sex appropriate education and counseling updated with regular exercise and diet Referrals for preventative services - pt will call for mammogram soon Immunizations addressed - declines covid booster, shiingrix Smoking counseling  - none needed Evidence for depression or other mood disorder - worsening anxiety/depression Most recent labs reviewed. I have personally reviewed and have noted: 1) the patient's medical and social history 2) The patient's current medications and supplements 3) The patient's height, weight, and BMI have been recorded in the chart

## 2022-01-31 NOTE — Assessment & Plan Note (Signed)
Worsening recent per pt, ok for counseling referral

## 2022-01-31 NOTE — Assessment & Plan Note (Signed)
Lab Results  Component Value Date   LDLCALC 56 01/30/2022   Stable, pt to continue current low chol diet

## 2022-01-31 NOTE — Assessment & Plan Note (Addendum)
Worsening recently, ok for celexa 20 qd start

## 2022-01-31 NOTE — Assessment & Plan Note (Signed)
Lab Results  Component Value Date   HGBA1C 6.9 (H) 01/30/2022   Mild uncontrolled, pt to continue current medical treatment metformin, and add ozempic if ok with insurance to help with wt loss

## 2022-01-31 NOTE — Assessment & Plan Note (Signed)
Stable volume, cont current torsemide

## 2022-01-31 NOTE — Assessment & Plan Note (Signed)
Lab Results  Component Value Date   CREATININE 1.02 01/30/2022   Stable overall, cont to avoid nephrotoxins

## 2022-01-31 NOTE — Assessment & Plan Note (Signed)
Uncontrolled, for add ozempic asd

## 2022-01-31 NOTE — Assessment & Plan Note (Signed)
Pt to continue low chol diet, excericse, declines statin for now

## 2022-01-31 NOTE — Assessment & Plan Note (Signed)
BP Readings from Last 3 Encounters:  01/30/22 134/72  12/30/21 (!) 142/70  12/30/21 (!) 142/70   Stable, pt to continue medical treatment lopressor

## 2022-02-02 ENCOUNTER — Ambulatory Visit: Payer: Commercial Managed Care - HMO | Admitting: General Practice

## 2022-02-03 ENCOUNTER — Telehealth: Payer: Self-pay | Admitting: Internal Medicine

## 2022-02-03 DIAGNOSIS — I890 Lymphedema, not elsewhere classified: Secondary | ICD-10-CM | POA: Diagnosis not present

## 2022-02-03 DIAGNOSIS — E1151 Type 2 diabetes mellitus with diabetic peripheral angiopathy without gangrene: Secondary | ICD-10-CM | POA: Diagnosis not present

## 2022-02-03 DIAGNOSIS — I110 Hypertensive heart disease with heart failure: Secondary | ICD-10-CM | POA: Diagnosis not present

## 2022-02-03 DIAGNOSIS — I872 Venous insufficiency (chronic) (peripheral): Secondary | ICD-10-CM | POA: Diagnosis not present

## 2022-02-03 DIAGNOSIS — I5033 Acute on chronic diastolic (congestive) heart failure: Secondary | ICD-10-CM | POA: Diagnosis not present

## 2022-02-03 MED ORDER — PRAMIPEXOLE DIHYDROCHLORIDE 0.5 MG PO TABS: 0.5000 mg | ORAL_TABLET | Freq: Three times a day (TID) | ORAL | 5 refills | Status: DC

## 2022-02-03 NOTE — Telephone Encounter (Signed)
Please advise . Patient wants a new rx be sent in for her. Patient states she is having some side effect from the Ropinirole ( Requip).

## 2022-02-03 NOTE — Telephone Encounter (Signed)
Patient calling in  Says since dosage was changed on med rOPINIRole (REQUIP) 1 MG tablet she has been having more side effects than usual (leg swelling.. sleepiness..arm/leg twitching)  Wants to know if provider can send in new rx for something similar w/ less side effects (does not want gabapentin)  Patient cb # [place], [place] Phone:  [phone]  Fax:  [phone]

## 2022-02-03 NOTE — Telephone Encounter (Signed)
Ok to try change the ropinarole to 0.5 mg mirapex - done erx

## 2022-02-04 NOTE — Telephone Encounter (Signed)
Patient notified of change.

## 2022-02-05 ENCOUNTER — Telehealth: Payer: Self-pay | Admitting: Licensed Clinical Social Worker

## 2022-02-05 NOTE — Telephone Encounter (Signed)
Scheduled reminder to check on pt rides led me to note that pt has cancelled all upcoming [facility] appointments. Remain available if pt reschedules these and needs further assistance. I will also mail pt an [facility] application which may assist with door to door needs in the future if she wishes to reschedule.    Westley Hummer, MSW, [place]  [phone]- work cell phone (preferred) [phone]- desk phone

## 2022-02-09 ENCOUNTER — Ambulatory Visit: Payer: Medicare Other | Admitting: General Practice

## 2022-02-10 ENCOUNTER — Ambulatory Visit: Payer: Medicare Other | Admitting: Cardiology

## 2022-02-10 DIAGNOSIS — M1711 Unilateral primary osteoarthritis, right knee: Secondary | ICD-10-CM | POA: Diagnosis not present

## 2022-02-10 DIAGNOSIS — M25561 Pain in right knee: Secondary | ICD-10-CM | POA: Diagnosis not present

## 2022-02-10 DIAGNOSIS — Z79891 Long term (current) use of opiate analgesic: Secondary | ICD-10-CM | POA: Diagnosis not present

## 2022-02-10 DIAGNOSIS — J3089 Other allergic rhinitis: Secondary | ICD-10-CM | POA: Diagnosis not present

## 2022-02-10 DIAGNOSIS — M5137 Other intervertebral disc degeneration, lumbosacral region: Secondary | ICD-10-CM | POA: Diagnosis not present

## 2022-02-10 DIAGNOSIS — J301 Allergic rhinitis due to pollen: Secondary | ICD-10-CM | POA: Diagnosis not present

## 2022-02-10 DIAGNOSIS — M25512 Pain in left shoulder: Secondary | ICD-10-CM | POA: Diagnosis not present

## 2022-02-10 DIAGNOSIS — M25551 Pain in right hip: Secondary | ICD-10-CM | POA: Diagnosis not present

## 2022-02-10 DIAGNOSIS — M25562 Pain in left knee: Secondary | ICD-10-CM | POA: Diagnosis not present

## 2022-02-10 DIAGNOSIS — G894 Chronic pain syndrome: Secondary | ICD-10-CM | POA: Diagnosis not present

## 2022-02-10 DIAGNOSIS — M25552 Pain in left hip: Secondary | ICD-10-CM | POA: Diagnosis not present

## 2022-02-10 DIAGNOSIS — M542 Cervicalgia: Secondary | ICD-10-CM | POA: Diagnosis not present

## 2022-02-10 DIAGNOSIS — M79661 Pain in right lower leg: Secondary | ICD-10-CM | POA: Diagnosis not present

## 2022-02-16 DIAGNOSIS — I129 Hypertensive chronic kidney disease with stage 1 through stage 4 chronic kidney disease, or unspecified chronic kidney disease: Secondary | ICD-10-CM | POA: Diagnosis not present

## 2022-02-16 DIAGNOSIS — I5032 Chronic diastolic (congestive) heart failure: Secondary | ICD-10-CM | POA: Diagnosis not present

## 2022-02-16 DIAGNOSIS — E1122 Type 2 diabetes mellitus with diabetic chronic kidney disease: Secondary | ICD-10-CM | POA: Diagnosis not present

## 2022-02-16 DIAGNOSIS — N1831 Chronic kidney disease, stage 3a: Secondary | ICD-10-CM | POA: Diagnosis not present

## 2022-02-18 ENCOUNTER — Ambulatory Visit (INDEPENDENT_AMBULATORY_CARE_PROVIDER_SITE_OTHER): Payer: Medicare Other | Admitting: *Deleted

## 2022-02-18 DIAGNOSIS — I5032 Chronic diastolic (congestive) heart failure: Secondary | ICD-10-CM

## 2022-02-18 DIAGNOSIS — E1169 Type 2 diabetes mellitus with other specified complication: Secondary | ICD-10-CM

## 2022-02-18 NOTE — Patient Instructions (Signed)
Visit [place], thank you for taking time to talk with me today. Please don't hesitate to contact me if I can be of assistance to you before our next scheduled telephone appointment  Below are the goals we discussed today:  Patient Self-Care Activities: [name] will: Take medications as prescribed Attend all scheduled provider appointments Call pharmacy for medication refills Call provider office for new concerns or questions Keep up the great work monitoring and writing down on paper daily weights at home: today you reported a weight of 366 lbs Continue to follow prescribed diet: low carb/ low sugar; heart healthy/ low salt; and will follow fluid restrictions of 48- 64 ounces per day Please make a follow up appointment with Dr. Percival Spanish since you have started retaining fluid recently Please contact your kidney doctor to ask her how long you should take the extra evening doses of your fluid pill (torsemide) Today, you reported taking torsemide 60 mg every morning and every afternoon Today, you reported taking metolazone 5 mg one time each week, on Tuesday mornings, before you take torsemide, x 5 weeks If you are having ongoing confusion around your medications, please reach out to the pharmacist that is working with you to request that he contact you sooner than he is currently scheduled  Watch daily for increased feeling of shortness of breath, swelling in feet, ankles and legs every day: if you have an increased amount of shortness of breath or swelling, make a note of it and let your cardiology provider know right away if it persists or gets worse Continue to keep legs up while sitting and wear compression stockings during daytime hours Stay as active as possible without over-doing: rest if you start to feel tired or "winded"/ short of breath Continue to monitor and write down on paper daily fasting blood sugars several times each week If you do not hear back to schedule  with the psychiatric provider group that has reached out to you, please call Dr. Gwynn Burly office and ask where the referral was placed, and get the phone number so you can call them and schedule a visit Continue your efforts to prevent falls- use your walker when you are walking  Our next scheduled telephone follow up visit/ appointment is scheduled on:  Wednesday, March 04, 2022 at 1:15  pm- This is a PHONE CALL appointment  If you need to cancel or re-schedule our visit, please call [phone] and our care guide team will be happy to assist you.   I look forward to hearing about your progress.   [name], RN, BSN, [place] [phone]: direct office  If you are experiencing a Mental Health or [place] or need someone to talk to, please  call the Suicide and Crisis Lifeline: 988 call the [place] National Suicide Prevention Lifeline: 403-592-4889 or TTY: 541-520-9847 TTY (604)742-3183) to talk to a trained counselor call 1-800-273-TALK (toll free, 24 hour hotline) go to [facility] [address] [place] [phone]) call 911   The patient verbalized understanding of instructions, educational materials, and care plan provided today and agreed to receive a mailed copy of patient instructions, educational materials, and care plan   Heart Failure Medicines Heart failure is a condition in which the heart cannot pump enough blood through the body. This can cause shortness of breath, coughing, fatigue, and confusion. Swelling in the feet, ankles, and legs is also common. Medicines  for heart failure can strengthen the heart's ability to pump blood and decrease the work it has to do. There is no cure for heart failure. However, taking medicines as directed and living a healthy lifestyle can help you stay active, avoid problems, and live longer. Talk with your health care  provider about all medicines that you are taking, how often you should take them, and possible side effects.  Tell a health care provider about: Any allergies you have. All medicines you are taking, including vitamins, herbs, eye drops, creams, and over-the-counter medicines. Any blood disorders you have. Any other medical conditions you have. Whether you are pregnant or may be pregnant. Types of medicines The medicines that are prescribed for you will depend on your symptoms, the type of heart failure you have, and the cause of your heart failure. In most cases, you may need to take more than one medicine. Angiotensin-converting enzyme (ACE) inhibitors, angiotensin receptor blockers (ARBs), and angiotensin receptor neprilysin inhibitor (ARNIs) These medicines help to dilate arteries and veins. This makes it easier for your heart to pump by lowering blood pressure and reducing the strain on your heart. These medicines can help to lessen the symptoms of heart failure. Common ACE inhibitors include lisinopril and ramipril. Common ARBs include losartan and valsartan. Common ARNIs include sacubitril with valsartan. Only take one of these medicines. Do not combine ACE inhibitors, ARBs, or ARNIs. The risk of side effects increases if more than one of these medicines is taken at the same time. Side effects include dry cough, dizziness, low blood pressure, high potassium levels, and kidney problems. You may need regular checkups and blood tests to monitor how the medicine is working. Do not take these medicines if you are pregnant or may become pregnant. They can cause serious birth defects. In rare cases, these medicines can cause a serious side effect called angioedema. Symptoms include swelling of the tongue, lips, mouth, or throat, trouble breathing, and hoarseness. Stomach pain or swelling and vomiting can also happen. If you have any of these symptoms, stop taking the medicine and contact your health  care provider right away. If you have had angioedema in the past, talk with your health care provider before starting one of these medicines. Beta-blockers These medicines slow your heart rate. This helps to improve the heart's ability to pump and lessen its workload. Common beta-blockers for heart failure are bisoprolol, carvedilol, and long-acting metoprolol (extended-release). Some beta-blockers can worsen lung diseases that cause wheezing, such as asthma. Talk with your health care provider before taking a beta-blocker if you have a lung disease. These medicines can hide the symptoms of low blood sugar (glucose), also called hypoglycemia. If you have diabetes, check your blood glucose carefully. If you have hypoglycemia, talk with your health care provider about adjusting your medicines. This medicine may make you feel dizzy or light-headed at first. Do not drive or use machinery when you first start these medicines. Ask your health care provider when it is safe for you to do this. Because these medicines slow your heart rate, it is important not to overdo it with exercise. Talk with your health care provider about what your target heart rate should be while you exercise. Do not stop this medicine suddenly unless your health care provider says to do this. Stopping this medicine suddenly can increase the risk of a heart attack or heart rhythm problems. If it is decided that stopping this medicine is right for you, your dose will be lowered  slowly to prevent these side effects. Diuretics Diuretics help the body get rid of excess sodium and water by increasing the need to urinate more often. This helps to lessen the amount of blood that the heart needs to pump. They also reduce fluid buildup in the lungs, ankles, and feet. Common diuretics include furosemide, bumetanide, and hydrochlorothiazide. The amount of water or fluid removed from the body depends on which diuretic is used. You may be asked to  weigh yourself to determine if you have too much or too little fluid. Ask your health care provider what your weight should be and when to contact your health care provider if it changes. Also, ask about how much fluid you should be drinking each day. These medicines can worsen problems with controlling urination (urinary incontinence). Talk to your health care provider about the best time of day to take this medicine if you have trouble with bladder control. Side effects include dizziness, headaches, muscle cramps, and an upset stomach. These medicines can cause dehydration. Symptoms include tiredness, increased thirst, confusion, and decreased urination. Contact your health care provider right away if you think you might be dehydrated. These medicines can cause your electrolyte levels (magnesium and potassium) and kidney function to change. These levels will be monitored by your health care provider. These medicines can make you more sensitive to the sun. Keep out of the sun. If you cannot avoid being in the sun, wear protective clothing and use sunscreen. Do not use sun lamps or tanning beds. Aldosterone antagonists These medicines are a type of diuretic. They get rid of excess sodium and water from the body by increasing the need to urinate. This helps to lessen the amount of blood that the heart needs to pump. Common aldosterone antagonists include spironolactone and eplerenone. Side effects include dizziness, low blood pressure, increased potassium levels, and kidney problems. You may need regular checkups and blood tests to monitor how this medicine is working. These medicines can raise the amount of potassium in the blood. Your potassium levels will be monitored regularly by your health care provider. Spironolactone and eplerenone may increase breast size in males and may cause breast tenderness in all patients. Digoxin Digoxin helps the heart pump blood better. It also lowers your heart rate.  It is usually added to other medicines if you are still having heart failure symptoms. Your health care provider will monitor your digoxin levels with blood tests. Too much digoxin can cause serious problems, such as an irregular heart rhythm. Early signs of digoxin toxicity include nausea, vomiting, loss of appetite, headaches, confusion, weakness, or vision changes, such as blurred vision or seeing more yellow or green than normal. Contact your health care provider right away if you have any of these signs. Vasodilators Hydralazine and nitrates relax the blood vessels and lower blood pressure. This helps your heart pump blood better. Hydralazine and nitrates are usually given with other medicines to control your heart failure symptoms. You may be given one or both of these medicines, depending on your symptoms. Side effects include headaches, a flushed face or neck, dizziness, and low blood pressure. If channel blockers Ivabradine is an If channel blocker. It lowers the heart rate and decreases the heart's workload. This medicine is usually given with beta-blockers if you have symptoms of heart failure and your heart rate is more than 70 beats per minute while taking a beta-blocker. Side effects include high blood pressure, brightness in your field of vision, slower than normal heart rate (  bradycardia), and heart rhythm problems. Grapefruit juice may increase the risk of side effects. It is recommended to avoid this. Do not take this medicine if you are pregnant or may become pregnant. It can cause serious birth defects. Sodium-glucose cotransporter-2 inhibitors (SGLT-2 inhibitors) SGLT-2 inhibitors are medicines used to treat type 2 diabetes. They have been shown to improve symptoms and quality of life in people with heart failure, even if they do not have diabetes. They also decrease the rate of hospitalizations and death. Common SGLT-2 inhibitors include canagliflozin and dapagliflozin. Side  effects include an increase in urinary tract infections, genital fungal infections, increased urination, and kidney problems. If you have diabetes, there is a small risk of hypoglycemia. There is also a small risk of diabetic ketoacidosis. This happens when your blood sugar is very high and usually requires treatment in the hospital. Summary When you have heart failure, taking medicines as directed and living a healthy lifestyle can help you stay active, avoid problems, and live longer. In most cases, you will need to take more than one medicine. It is important to talk with your health care provider about how often you should take your medicines. Do not skip a dose or change your dosage. Talk to your health care provider about possible side effects of these medicines. This information is not intended to replace advice given to you by your health care provider. Make sure you discuss any questions you have with your health care provider. Document Revised: 08/19/2020 Document Reviewed: 08/19/2020 [employer] Patient Education  [place].

## 2022-02-18 NOTE — Chronic Care Management (AMB) (Signed)
Chronic Care Management   CCM RN Visit Note  02/18/2022 Name: Maria [name] MRN: [id number] DOB: 10-Nov-1956  Subjective: Maria [name] is a 66 y.o. year old female who is a primary care Maria of Biagio Borg, MD. The care management team was consulted for assistance with disease management and care coordination needs.    Engaged with Maria by telephone for follow up visit in response to provider referral for case management and/or care coordination services.   Consent to Services:  The Maria was given information about Chronic Care Management services, agreed to services, and gave verbal consent prior to initiation of services.  Please see initial visit note for detailed documentation.  Maria agreed to services and verbal consent obtained.   Assessment: Review of Maria past medical history, allergies, medications, health status, including review of consultants reports, laboratory and other test data, was performed as part of comprehensive evaluation and provision of chronic care management services.   SDOH (Social Determinants of Health) assessments and interventions performed:    CCM Care Plan  Allergies  Allergen Reactions   Amoxicillin-Pot Clavulanate Nausea And Vomiting    Projectile vomiting Did it involve swelling of the face/tongue/throat, SOB, or low BP? No Did it involve sudden or severe rash/hives, skin peeling, or any reaction on the inside of your mouth or nose? No Did you need to seek medical attention at a hospital or doctor's office? No When did it last happen?      20-30 years If all above answers are NO, may proceed with cephalosporin use.    Adhesive [Tape] Other (See Comments)    Tears skin off - use paper tape    Codeine Other (See Comments)    hallucinations   Crestor [Rosuvastatin Calcium]     Severe muscle cramps   Morphine And Related Nausea And Vomiting and Other (See Comments)    Migraine headaches   Vicodin [Hydrocodone-Acetaminophen]  Other (See Comments)    hallucinations   Latex Rash   Outpatient Encounter Medications as of 02/18/2022  Medication Sig Note   metolazone (ZAROXOLYN) 5 MG tablet Take 5 mg by mouth daily. Maria reports kidney doctor prescribed 02/16/22- take one pill every week x 5 weeks- Maria reports taking first dose on Tuesday 02/18/22    Accu-Chek Softclix Lancets lancets Use as directed up to 4 times daily (Maria taking differently: 1 each by Other route in the morning, at noon, in the evening, and at bedtime.)    acetaminophen (TYLENOL) 325 MG tablet Take 650 mg by mouth every 6 (six) hours as needed for headache or fever (pain).    albuterol (PROAIR HFA) 108 (90 Base) MCG/ACT inhaler Inhale 2 puffs into the lungs every 6 (six) hours as needed for wheezing or shortness of breath.    Blood Glucose Monitoring Suppl (ACCU-CHEK GUIDE) w/Device KIT Use as directed (Maria taking differently: 1 each by Other route as directed.)    citalopram (CELEXA) 20 MG tablet Take 1 tablet (20 mg total) by mouth daily.    clonazePAM (KLONOPIN) 1 MG tablet Take 1 tablet (1 mg total) by mouth 2 (two) times daily as needed for anxiety.    cyclobenzaprine (FLEXERIL) 10 MG tablet TAKE 1 TABLET BY MOUTH 2 TIMES A DAY AS NEEDED FOR MUSCLE SPASMS    Fexofenadine HCl (ALLEGRA PO) Take 180 mg by mouth daily as needed (allergies).    glucose blood (ACCU-CHEK GUIDE) test strip Use as instructed up to 4 times daily    Metamucil  Fiber CHEW Chew 3 tablets by mouth at bedtime.    metFORMIN (GLUCOPHAGE) 500 MG tablet Take 1 tablet (500 mg total) by mouth 2 (two) times daily with a meal. (Maria not taking: Reported on 12/30/2021)    metoprolol tartrate (LOPRESSOR) 25 MG tablet Take 25 mg by mouth 2 (two) times daily. 12/18/2021: LF at [facility] on 10/28/21. 90 Day Supply    NARCAN 4 MG/0.1ML LIQD nasal spray kit Place 0.4 mg into the nose daily as needed (opioid overdose).    ondansetron (ZOFRAN ODT) 4 MG disintegrating tablet Take 1  tablet (4 mg total) by mouth every 4 (four) hours as needed for nausea or vomiting.    Oxycodone HCl 10 MG TABS Take 1 tablet (10 mg total) 4 (four) times daily by mouth. Per [facility] (Maria taking differently: Take 10 mg by mouth every 4 (four) hours as needed (pain). Per [facility]) 12/18/2021: LF at [facility] on 12/08/21. 30 Day Supply    pantoprazole (PROTONIX) 40 MG tablet TAKE ONE TABLET BY MOUTH DAILY    Polyethyl Glycol-Propyl Glycol 0.4-0.3 % SOLN Apply 1 drop to eye daily as needed (dry eyes).    polyethylene glycol powder (GLYCOLAX/MIRALAX) 17 GM/SCOOP powder Take 17 g by mouth daily as needed for mild constipation.    potassium chloride SA (KLOR-CON) 20 MEQ tablet Take 1 tablet (20 mEq total) by mouth 2 (two) times daily.    pramipexole (MIRAPEX) 0.5 MG tablet Take 1 tablet (0.5 mg total) by mouth 3 (three) times daily.    Semaglutide, 1 MG/DOSE, 4 MG/3ML SOPN Inject 1 mg as directed once a week.    torsemide (DEMADEX) 20 MG tablet Take 2 tablets in the morning and 1 tablet in the afternoon. 01/05/2022: 01/05/22- reports currently taking (3) 20 mg po in morning after having had weight gain x 3 days- up to 373 lbs (baseline 365) and (1) 20 mg in evening   [DISCONTINUED] DULoxetine (CYMBALTA) 60 MG capsule Take 60 mg by mouth daily.      No facility-administered encounter medications on file as of 02/18/2022.   Maria Active Problem List   Diagnosis Date Noted   Dyspnea 12/19/2021   CHF exacerbation ([place]) 12/18/2021   Leg swelling 11/20/2021   Bilateral knee pain 10/29/2021   Hypernatremia 09/24/2021   CKD (chronic kidney disease), stage IIIa ([place]) 09/24/2021   Sacral decubitus ulcer, stage II ([place]) 09/20/2021   Acute on chronic diastolic CHF (congestive heart failure) ([place]) 09/19/2021   Nail disorder 07/07/2021   Pre-ulcerative corn or callous 07/07/2021   Rash 22/97/9892   Diastolic congestive heart failure ([place]) 03/17/2021   Diabetes mellitus type 2  in obese ([place]) 03/11/2021   Vitamin D deficiency 01/01/2021   Medial epicondylitis 12/31/2020   Aortic atherosclerosis ([place]) 11/94/1740   Umbilical hernia 81/44/8185   Constipation 08/05/2020   Arthritis 06/12/2020   Hoarseness 05/01/2020   Vitamin B 12 deficiency 01/31/2020   Rosacea 01/31/2020   Neck swelling 01/31/2020   History of colonic polyps    Benign neoplasm of [place]    Nausea 08/31/2019   Throat pain 07/26/2019   HLD (hyperlipidemia) 05/24/2019   Leg cramps 05/24/2019   Lump in neck 05/24/2019   Left thyroid nodule 01/25/2019   Jerking movements of extremities 07/20/2018   Balance disorder 07/20/2018   Right knee pain 03/21/2018   OSA (obstructive sleep apnea) 03/21/2018   Oxygen desaturation 03/21/2018   Urinary symptom or sign 03/21/2018   Acquired lymphedema 01/25/2018  Gait disorder 01/25/2018   Peripheral edema 01/16/2018   SOB (shortness of breath) 01/15/2018   Dysphonia 12/07/2017   Encounter for well adult exam with abnormal findings 11/08/2017   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[place]) 05/31/2017   Poznanski polyp 08/11/2016   Abdominal pain, epigastric    Dysphagia    Hum cancer screening    ACE-inhibitor cough 12/05/2015   Restless legs syndrome 10/08/2015   Venous stasis dermatitis of both lower extremities 04/26/2015   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/17/2014   AR (allergic rhinitis) 10/17/2014   Fatty liver 10/17/2014   Chronic pain syndrome 09/19/2014   Post-traumatic osteoarthritis of both knees 09/19/2014   Spinal stenosis 09/19/2014   Depression, major, single episode, complete remission ([place]) 09/19/2014   Narcotic dependence ([place]) 09/19/2014   Spondylosis of 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 03/09/2012   Lumbar disc disease 03/09/2012   Chronic pain 03/09/2012   Morbid obesity ([place]) 06/17/2011   Chronic low back pain 12/28/2008   Anxiety state 12/06/2007   Depression 12/06/2007   Essential hypertension 12/06/2007    Gastroesophageal reflux disease without esophagitis 12/06/2007   LOW BACK PAIN 12/06/2007   Conditions to be addressed/monitored:  CHF and DMII  Care Plan : RN Care Manager Plan of Care  Updates made by [name] Royalty, RN since 02/18/2022 12:00 AM     Problem: Chronic Disease Management Needs   Priority: Medium     Long-Range Goal: Ongoing adherence to plan of care for long term chronic disease management   Start Date: 08/26/2021  Expected End Date: 08/26/2022  Priority: Medium  Note:   Current Barriers:  Chronic Disease Management support and education needs related to CHF and DMII Fragile state of health, multiple progressing chronic health conditions Recent in-Maria hospitalizations x 2 in Spring 2022 for CHF exacerbation/ weight gain at home, shortness of breath Hospitalization: September 30- September 25, 2021- CHF exacerbation; discharged with home health services in place;  Hospitalization December 18, 2021- December 21, 2021 for CHF exacerbation with shortness of breath: this is Maria's 4th inpatient hospitalization in one year for CHF exacerbation  RNCM Clinical Goal(s):  Maria will demonstrate ongoing health management independence for CHF and DMII  through collaboration with RN Care manager, provider, and care team.   Interventions: 1:1 collaboration with primary care provider regarding development and update of comprehensive plan of care as evidenced by provider attestation and co-signature Inter-disciplinary care team collaboration (see longitudinal plan of care) Evaluation of current treatment plan related to  self management and Maria's adherence to plan as established by provider Chart reviewed including relevant office notes, upcoming scheduled appointments, and lab results 02/18/22: Reviewed recent PCP office visit 01/30/22 with Maria Confirms she has started taking antidepressant as prescribed Confirms she has heard from psychiatric referral provider-- reports she  asked them to re-contact her to schedule with female provider; states "they told me they would call me back;"  she has not heard back since then; she did not write down the name or the phone number of the referred psychiatric provider group; from review of EHR, I am unable to determine where the referral was placed; Maria states she will give them "pne more week" to call her back, and if she has not heard back by then, she will contact Dr. [name] Reichmann to follow up and obtain name/ number of referred provider Confirmed she is taking Ozempic q week Reviewed 02/16/22 renal provider office visit-- she tells me that she has gained "a lot" of weight and went  to provider, who agreed; provider told her that "fluid retention is so bad your legs are weeping a lot of fluid;" Maria reports her weight "was at least 6 or 7 pounds higher then normal" when she visited with renal provider; she endorses increased shortness of breath and abdominal/ lower extremity swelling that is "slowly getting a little better;" she also reports "really bad muscle cramps" Reports renal provider instructed 02/16/22  to: Increase torsemide to 60 mg po q morning and 60 mg q afternoon-- Maria states she did not tell her how long she should take this increased afternoon dosing (was previously taking Torsemide 60 mg q morning, 20 mg q afternoon)-- states that she will contact renal provider to clarify how long she should take this increased dose Start taking metolazone 5 mg one time each week before taking torsemide x 5 weeks- Maria started this yesterday Re-start taking po K+ every day- Maria has started taking States renal doctor told her she "would have someone come out and wrap my legs for me;" but added "it might take a week or two;" Maria has no idea who this referral was placed with- and I am unable to determine from review of EHR: Maria reports she will call renal provider herself to follow up Maria very interested in making sure  she does not become dehydrated with new dosing of diuretics per renal provider instructions on 02/16/22 Maria remains very confused around her medications- made her aware that she has scheduled [facility] Pharmacy team call at end of month-- Maria asks for sooner appointment: per [facility] pharmacy previous request, I instructed Maria to call [facility] office and request urgent call-back from CCM pharmacist; I will also make pharmacist aware of this request Confirmed home health services have now ended Discussed lack of upcoming scheduled cardiology provider office visits: her transportation through insurance did not show up and she had to miss scheduled office visit; confirmed she is now using "[facility]" and encouraged Maria to re-schedule promptly  Heart Failure Interventions:  (Status: 02/18/22: Goal on Track (progressing): YES.) Discussed importance of daily weight and advised Maria to weigh and record daily Reviewed role of diuretics in prevention of fluid overload and management of heart failure Discussed the importance of keeping all appointments with provider Confirmed that since renal provider office visit 02/16/22, Maria reports that her legs "look a little better" and are "less weepy;" confirmed that her weight has returned to baseline, with a reported weight today of "366 lbs" Reinforced previously provided education around balance between fluid retention management and prevention of dehydration: Maria requires ongoing reinforcement and her diuretic plan changes so frequently, she tends to remain confused as to what she should be concerned about Confirms continues following low salt, heart healthy, carb-modified, low sugar diet: we discussed parameters of low salt diet; she reports trying to keep sodium intake to < 1200-1400 mg/ day-- positive reinforcement provided with encouragement to continue efforts, will provide printed educational material Reinforced previously provided  education around signs/ symptoms CHF yellow zone, importance of daily assessments at home, action plan for yellow CHF zone: Maria remains able to verbalize all with minimal prompting-- however, she will continue to benefit from ongoing support and reinforcement given fragile, ever-changing clinical status  Diabetes:  (Status: 02/18/22: Goal on Track (progressing): YES.) Lab Results  Component Value Date   HGBA1C 6.9 (H) 01/30/2022  Discussed plans with Maria for ongoing care management follow up and provided Maria with direct contact information for care management team;  Review of Maria status, including review of consultants reports, relevant laboratory and other test results, and medications completed;        Assessed Maria's understanding of meaning/ significance of A1-C values: good baseline understanding of same- Reviewed individual historical A1-C trends and provided education around correlation of A1-C value to blood sugar levels at home over 3 months: most recent A1-C from 01/30/22 PCP office visit reviewed with Maria: she is right at her normal baseline  Provided phone number to the referral placed by PCP 12/30/21 for "[facility]" team, per Maria request as she again confirms that she was contacted by team and was told that there is a current waiting list which they expect to be in place for > one year  Maria Goals/Self-Care Activities: As evidenced by review of EHR, collaboration with care team, and Maria reporting during CCM RN CM outreach,  Maria [name] will: Take medications as prescribed Attend all scheduled provider appointments Call pharmacy for medication refills Call provider office for new concerns or questions Keep up the great work monitoring and writing down on paper daily weights at home: today you reported a weight of 366 lbs Continue to follow prescribed diet: low carb/ low sugar; heart healthy/ low salt; and will follow fluid  restrictions of 48- 64 ounces per day Please make a follow up appointment with Dr. Percival Spanish since you have started retaining fluid recently Please contact your kidney doctor to ask her how long you should take the extra evening doses of your fluid pill (torsemide) Today, you reported taking torsemide 60 mg every morning and every afternoon Today, you reported taking metolazone 5 mg one time each week, on Tuesday mornings, before you take torsemide, x 5 weeks If you are having ongoing confusion around your medications, please reach out to the pharmacist that is working with you to request that he contact you sooner than he is currently scheduled  Watch daily for increased feeling of shortness of breath, swelling in feet, ankles and legs every day: if you have an increased amount of shortness of breath or swelling, make a note of it and let your cardiology provider know right away if it persists or gets worse Continue to keep legs up while sitting and wear compression stockings during daytime hours Stay as active as possible without over-doing: rest if you start to feel tired or "winded"/ short of breath Continue to monitor and write down on paper daily fasting blood sugars several times each week If you do not hear back to schedule with the psychiatric provider group that has reached out to you, please call Dr. Gwynn Burly office and ask where the referral was placed, and get the phone number so you can call them and schedule a visit Continue your efforts to prevent falls- use your walker when you are walking      Plan: Telephone follow up appointment with care management team member scheduled for:  Wednesday, March 04, 2022 at 1:15 pm The Maria has been provided with contact information for the care management team and has been advised to call with any health related questions or concerns  [name], RN, BSN, [place] [phone]: direct office

## 2022-02-19 ENCOUNTER — Telehealth: Payer: Self-pay | Admitting: Internal Medicine

## 2022-02-19 NOTE — Telephone Encounter (Signed)
Called patient and left message to inform patient that her referral was put in to [facility] ?

## 2022-02-19 NOTE — Telephone Encounter (Signed)
On 02-03-2022 ov pt discussed a referral for counseling w/ provider per avs,  ? ?Pt checking status of referral, I was unable to locate referral information ? ?Pt requesting a c/b ? ? ? ? ?

## 2022-03-04 ENCOUNTER — Ambulatory Visit: Payer: Medicare Other | Admitting: *Deleted

## 2022-03-04 DIAGNOSIS — E1169 Type 2 diabetes mellitus with other specified complication: Secondary | ICD-10-CM

## 2022-03-04 DIAGNOSIS — I5032 Chronic diastolic (congestive) heart failure: Secondary | ICD-10-CM

## 2022-03-04 NOTE — Chronic Care Management (AMB) (Signed)
?Chronic Care Management  ? ?CCM RN Visit Note ? ?03/04/2022 ?Name: Maria [name] MRN: [id number] DOB: June 23, 1956 ? ?Subjective: ?Maria [name] is a 66 y.o. year old female who is a primary care patient of Biagio Borg, MD. The care management team was consulted for assistance with disease management and care coordination needs.   ? ?Engaged with patient by telephone for follow up visit in response to provider referral for case management and/or care coordination services.  ? ?Consent to Services:  ?The patient was given information about Chronic Care Management services, agreed to services, and gave verbal consent prior to initiation of services.  Please see initial visit note for detailed documentation.  ?Patient agreed to services and verbal consent obtained.  ? ?Assessment: Review of patient past medical history, allergies, medications, health status, including review of consultants reports, laboratory and other test data, was performed as part of comprehensive evaluation and provision of chronic care management services.  ?CCM Care Plan ? ?Allergies  ?Allergen Reactions  ? Amoxicillin-Pot Clavulanate Nausea And Vomiting  ?  Projectile vomiting ?Did it involve swelling of the face/tongue/throat, SOB, or low BP? No ?Did it involve sudden or severe rash/hives, skin peeling, or any reaction on the inside of your mouth or nose? No ?Did you need to seek medical attention at a hospital or doctor's office? No ?When did it last happen?      20-30 years ?If all above answers are ?NO?, may proceed with cephalosporin use. ?  ? Adhesive [Tape] Other (See Comments)  ?  Tears skin off - use paper tape   ? Codeine Other (See Comments)  ?  hallucinations  ? Crestor [Rosuvastatin Calcium]   ?  Severe muscle cramps  ? Morphine And Related Nausea And Vomiting and Other (See Comments)  ?  Migraine headaches  ? Vicodin [Hydrocodone-Acetaminophen] Other (See Comments)  ?  hallucinations  ? Latex Rash  ? ? ?Outpatient Encounter  Medications as of 03/04/2022  ?Medication Sig Note  ? Accu-Chek Softclix Lancets lancets Use as directed up to 4 times daily (Patient taking differently: 1 each by Other route in the morning, at noon, in the evening, and at bedtime.)   ? acetaminophen (TYLENOL) 325 MG tablet Take 650 mg by mouth every 6 (six) hours as needed for headache or fever (pain).   ? albuterol (PROAIR HFA) 108 (90 Base) MCG/ACT inhaler Inhale 2 puffs into the lungs every 6 (six) hours as needed for wheezing or shortness of breath.   ? Blood Glucose Monitoring Suppl (ACCU-CHEK GUIDE) w/Device KIT Use as directed (Patient taking differently: 1 each by Other route as directed.)   ? citalopram (CELEXA) 20 MG tablet Take 1 tablet (20 mg total) by mouth daily.   ? clonazePAM (KLONOPIN) 1 MG tablet Take 1 tablet (1 mg total) by mouth 2 (two) times daily as needed for anxiety.   ? cyclobenzaprine (FLEXERIL) 10 MG tablet TAKE 1 TABLET BY MOUTH 2 TIMES A DAY AS NEEDED FOR MUSCLE SPASMS (Patient not taking: Reported on 03/04/2022)   ? Fexofenadine HCl (ALLEGRA PO) Take 180 mg by mouth daily as needed (allergies).   ? glucose blood (ACCU-CHEK GUIDE) test strip Use as instructed up to 4 times daily   ? Metamucil Fiber CHEW Chew 3 tablets by mouth at bedtime.   ? metFORMIN (GLUCOPHAGE) 500 MG tablet Take 1 tablet (500 mg total) by mouth 2 (two) times daily with a meal. (Patient not taking: Reported on 12/30/2021)   ? metolazone (  ZAROXOLYN) 5 MG tablet Take 5 mg by mouth daily. Patient reports kidney doctor prescribed 02/16/22- take one pill every week x 5 weeks- patient reports taking first dose on Tuesday 02/18/22 (Patient not taking: Reported on 03/04/2022)   ? metoprolol tartrate (LOPRESSOR) 25 MG tablet Take 25 mg by mouth 2 (two) times daily. 12/18/2021: LF at [facility] on 10/28/21. 90 Day Supply ?  ? NARCAN 4 MG/0.1ML LIQD nasal spray kit Place 0.4 mg into the nose daily as needed (opioid overdose).   ? ondansetron (ZOFRAN ODT) 4 MG disintegrating  tablet Take 1 tablet (4 mg total) by mouth every 4 (four) hours as needed for nausea or vomiting.   ? Oxycodone HCl 10 MG TABS Take 1 tablet (10 mg total) 4 (four) times daily by mouth. Per [facility] (Patient taking differently: Take 10 mg by mouth every 4 (four) hours as needed (pain). Per [facility]) 12/18/2021: LF at [facility] on 12/08/21. 30 Day Supply ?  ? pantoprazole (PROTONIX) 40 MG tablet TAKE ONE TABLET BY MOUTH DAILY   ? Polyethyl Glycol-Propyl Glycol 0.4-0.3 % SOLN Apply 1 drop to eye daily as needed (dry eyes).   ? polyethylene glycol powder (GLYCOLAX/MIRALAX) 17 GM/SCOOP powder Take 17 g by mouth daily as needed for mild constipation.   ? potassium chloride SA (KLOR-CON) 20 MEQ tablet Take 1 tablet (20 mEq total) by mouth 2 (two) times daily.   ? pramipexole (MIRAPEX) 0.5 MG tablet Take 1 tablet (0.5 mg total) by mouth 3 (three) times daily.   ? Semaglutide, 1 MG/DOSE, 4 MG/3ML SOPN Inject 1 mg as directed once a week.   ? torsemide (DEMADEX) 20 MG tablet Take 2 tablets in the morning and 1 tablet in the afternoon. (Patient not taking: Reported on 03/04/2022) 01/05/2022: 01/05/22- reports currently taking (3) 20 mg po in morning after having had weight gain x 3 days- up to 373 lbs (baseline 365) and (1) 20 mg in evening  ? [DISCONTINUED] DULoxetine (CYMBALTA) 60 MG capsule Take 60 mg by mouth daily.     ? ?No facility-administered encounter medications on file as of 03/04/2022.  ? ?Patient Active Problem List  ? Diagnosis Date Noted  ? Dyspnea 12/19/2021  ? CHF exacerbation ([place]) 12/18/2021  ? Leg swelling 11/20/2021  ? Bilateral knee pain 10/29/2021  ? Hypernatremia 09/24/2021  ? CKD (chronic kidney disease), stage IIIa ([place]) 09/24/2021  ? Sacral decubitus ulcer, stage II ([place]) 09/20/2021  ? Acute on chronic diastolic CHF (congestive heart failure) ([place]) 09/19/2021  ? Nail disorder 07/07/2021  ? Pre-ulcerative corn or callous 07/07/2021  ? Rash 07/07/2021  ? Diastolic congestive  heart failure ([place]) 03/17/2021  ? Diabetes mellitus type 2 in obese ([place]) 03/11/2021  ? Vitamin D deficiency 01/01/2021  ? Medial epicondylitis 12/31/2020  ? Aortic atherosclerosis ([place]) 09/26/2020  ? Umbilical hernia 75/17/0017  ? Constipation 08/05/2020  ? Arthritis 06/12/2020  ? Hoarseness 05/01/2020  ? Vitamin B 12 deficiency 01/31/2020  ? Rosacea 01/31/2020  ? Neck swelling 01/31/2020  ? History of colonic polyps   ? Benign neoplasm of [place]   ? Nausea 08/31/2019  ? Throat pain 07/26/2019  ? HLD (hyperlipidemia) 05/24/2019  ? Leg cramps 05/24/2019  ? Lump in neck 05/24/2019  ? Left thyroid nodule 01/25/2019  ? Jerking movements of extremities 07/20/2018  ? Balance disorder 07/20/2018  ? Right knee pain 03/21/2018  ? OSA (obstructive sleep apnea) 03/21/2018  ? Oxygen desaturation 03/21/2018  ? Urinary symptom or sign  03/21/2018  ? Acquired lymphedema 01/25/2018  ? Gait disorder 01/25/2018  ? Peripheral edema 01/16/2018  ? SOB (shortness of breath) 01/15/2018  ? Dysphonia 12/07/2017  ? Encounter for well adult exam with abnormal findings 11/08/2017  ? 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 Nickless polyp 08/11/2016  ? Abdominal pain, epigastric   ? Dysphagia   ? [place] cancer screening   ? ACE-inhibitor cough 12/05/2015  ? Restless legs syndrome 10/08/2015  ? Venous stasis dermatitis of both lower extremities 04/26/2015  ? 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/17/2014  ? AR (allergic rhinitis) 10/17/2014  ? Fatty liver 10/17/2014  ? Chronic pain syndrome 09/19/2014  ? Post-traumatic osteoarthritis of both knees 09/19/2014  ? Spinal stenosis 09/19/2014  ? Depression, major, single episode, complete remission ([place]) 09/19/2014  ? Narcotic dependence ([place]) 09/19/2014  ? Spondylosis of 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 03/09/2012  ? Lumbar disc disease 03/09/2012  ? Chronic pain 03/09/2012  ? Morbid obesity ([place]) 06/17/2011  ? Chronic low back pain 12/28/2008  ? Anxiety state 12/06/2007  ? Depression  12/06/2007  ? Essential hypertension 12/06/2007  ? Gastroesophageal reflux disease without esophagitis 12/06/2007  ? LOW BACK PAIN 12/06/2007  ? ?Conditions to be addressed/monitored:  CHF and DMII ? ?Care Pl

## 2022-03-04 NOTE — Patient Instructions (Signed)
Visit Information ? ?Maria [name], thank you for taking time to talk with me today. Please don't hesitate to contact me if I can be of assistance to you before our next scheduled telephone appointment ? ?As we discussed today, please inform your doctors that you have not been taking your diuretic recently ? ?Below are the goals we discussed today:  ?Patient Self-Care Activities: ?[name] will: ?Take medications as prescribed ?Attend all scheduled provider appointments ?Call pharmacy for medication refills ?Call provider office for new concerns or questions ?Keep up the great work monitoring and writing down on paper daily weights at home: today you reported a weight of 363 lbs ?Continue to follow prescribed diet: low carb/ low sugar; heart healthy/ low salt; and will follow fluid restrictions of 48- 64 ounces per day ?Please make sure you have a follow up appointment with Dr. Percival Spanish since you have started retaining fluid recently ?Please contact your kidney doctor to ask her about the "lymphedema specialist" she referred you to-- if you do not hear back from this referral by the end of this week ?Watch daily for increased feeling of shortness of breath, swelling in feet, ankles and legs every day: if you have an increased amount of shortness of breath or swelling, make a note of it and let your cardiology provider know right away if it persists or gets worse ?Continue to keep legs up while sitting and wear compression stockings during daytime hours ?Stay as active as possible without over-doing: rest if you start to feel tired or "winded"/ short of breath ?Continue to monitor and write down on paper daily fasting blood sugars several times each week ?If you have not heard back to schedule with the psychiatric provider group that has reached out to you, please call them to follow up, using the phone number that was provided to you ?Continue your efforts to prevent falls- use your walker when you are  walking ? ?Our next scheduled telephone follow up visit/ appointment is scheduled on: Friday, April 03, 2022 at 2:30  pm- This is a PHONE [place] appointment ? ?If you need to cancel or re-schedule our visit, please call [phone] and our care guide team will be happy to assist you. ?  ?I look forward to hearing about your progress. ?  ?Maria Rack, RN, BSN, CCRN Alumnus ?[place] ?([phone]: direct office ? ?If you are experiencing a Mental Health or [place] or need someone to talk to, please  ?call the Suicide and Crisis Lifeline: 988 ?call the [place] National Suicide Prevention Lifeline: 716-050-5107 or TTY: (931)827-3200 TTY 949 014 8504) to talk to a trained counselor ?call 1-800-273-TALK (toll free, 24 hour hotline) ?go to [facility] [address] [place] [phone]) ?call 911  ? ?Patient verbalizes understanding of instructions and care plan provided today and agrees to view in [place]. Active MyChart status confirmed with patient ? ?Lymphedema ?Lymphedema is swelling that is caused by the abnormal collection of lymph in the tissues under the skin. Lymph is excess fluid from the tissues in your body that is removed through the lymphatic system. This system is part of your body's defense system (immune system) and includes lymph nodes and lymph vessels. The lymph vessels collect and carry the excess fluid, fats, proteins, and waste from the tissues of the body to the bloodstream. This system also works to clean and remove bacteria and waste products from the body. ?Lymphedema occurs when the  lymphatic system is blocked. When the lymph vessels or lymph nodes are blocked or damaged, lymph does not drain properly. This causes an abnormal buildup of lymph, which leads to swelling in the affected area. This may include the trunk area, or an arm or leg. Lymphedema cannot be cured by medicines, but  various methods can be used to help reduce the swelling. ?What are the causes? ?The cause of this condition depends on the type of lymphedema that you have. ?Primary lymphedema is caused by the absence of lymph vessels or having abnormal lymph vessels at birth. ?Secondary lymphedema occurs when lymph vessels are blocked or damaged. Secondary lymphedema is more common. Common causes of lymph vessel blockage include: ?Skin infection, such as cellulitis. ?Infection by parasites (filariasis). ?Injury. ?Radiation therapy. ?Cancer. ?Formation of scar tissue. ?Surgery. ?What are the signs or symptoms? ?Symptoms of this condition include: ?Swelling of the arm or leg. ?A heavy or tight feeling in the arm or leg. ?Swelling of the feet, toes, or fingers. Shoes or rings may fit more tightly than before. ?Redness of the skin over the affected area. ?Limited movement of the affected limb. ?Sensitivity to touch or discomfort in the affected limb. ?How is this diagnosed? ?This condition may be diagnosed based on: ?Your symptoms and medical history. ?A physical exam. ?Bioimpedance spectroscopy. In this test, painless electrical currents are used to measure fluid levels in your body. ?Imaging tests, such as: ?MRI. ?CT scan. ?Duplex ultrasound. This test uses sound waves to produce images of the vessels and the blood flow on a screen. ?Lymphoscintigraphy. In this test, a low dose of a radioactive substance is injected to trace the flow of lymph through your lymph vessels. ?Lymphangiography. In this test, a contrast dye is injected into the lymph vessel to help show blockages. ?How is this treated? ?If an underlying condition is causing the lymphedema, that condition will be treated. For example, antibiotic medicines may be used to treat an infection. ?Treatment for this condition will depend on the cause of your lymphedema. Treatment may include: ?Complete decongestive therapy (CDT). This is done by a certified lymphedema therapist to  reduce fluid congestion. This therapy includes: ?Skin care. ?Compression wrapping of the affected area. ?Manual lymph drainage. This is a special massage technique that promotes lymph drainage out of a limb. ?Specific exercises. Certain exercises can help fluid move out of the affected limb. ?Compression. Various methods may be used to apply pressure to the affected limb to reduce the swelling. They include: ?Wearing compression stockings or sleeves on the affected limb. ?Wrapping the affected limb with special bandages. ?Surgery. This is usually done for severe cases only. For example, surgery may be done if you have trouble moving the limb or if the swelling does not get better with other treatments. ?Follow these instructions at home: ?Self-care ?The affected area is more likely to become injured or infected. Take these steps to help prevent infection: ?Keep the affected area clean and dry. ?Use approved creams or lotions to keep the skin moisturized. ?Protect your skin from cuts: ?Use gloves while cooking or gardening. ?Do not walk barefoot. ?If you shave the affected area, use an Copy. ?Do not wear tight clothes, shoes, or jewelry. ?Eat a healthy diet that includes a lot of fruits and vegetables. ?Activity ?Do exercises as told by your health care provider. ?Do not sit with your legs crossed. ?When possible, keep the affected limb raised (elevated) above the level of your heart. ?Avoid carrying things with an arm  that is affected by lymphedema. ?General instructions ?Wear compression stockings or sleeves as told by your health care provider. ?Note any changes in size of the affected limb. You may be instructed to take regular measurements and keep track of them. ?Take over-the-counter and prescription medicines only as told by your health care provider. ?If you were prescribed an antibiotic medicine, take or apply it as told by your health care provider. Do not stop using the antibiotic even if you  start to feel better or if your condition improves. ?Do not use heating pads or ice packs on the affected area. ?Avoid having blood draws, IV insertions, or blood pressure checks on the affected limb. ?Keep all fol

## 2022-03-09 ENCOUNTER — Ambulatory Visit (INDEPENDENT_AMBULATORY_CARE_PROVIDER_SITE_OTHER): Payer: Medicare Other | Admitting: Internal Medicine

## 2022-03-09 ENCOUNTER — Encounter: Payer: Self-pay | Admitting: Internal Medicine

## 2022-03-09 ENCOUNTER — Other Ambulatory Visit: Payer: Self-pay

## 2022-03-09 VITALS — BP 132/78 | HR 74 | Ht 63.0 in | Wt 366.6 lb

## 2022-03-09 DIAGNOSIS — E538 Deficiency of other specified B group vitamins: Secondary | ICD-10-CM

## 2022-03-09 DIAGNOSIS — E669 Obesity, unspecified: Secondary | ICD-10-CM

## 2022-03-09 DIAGNOSIS — E1169 Type 2 diabetes mellitus with other specified complication: Secondary | ICD-10-CM | POA: Diagnosis not present

## 2022-03-09 DIAGNOSIS — R0602 Shortness of breath: Secondary | ICD-10-CM

## 2022-03-09 DIAGNOSIS — I5032 Chronic diastolic (congestive) heart failure: Secondary | ICD-10-CM | POA: Diagnosis not present

## 2022-03-09 LAB — COMPREHENSIVE METABOLIC PANEL
ALT: 18 U/L (ref 0–35)
AST: 20 U/L (ref 0–37)
Albumin: 4.1 g/dL (ref 3.5–5.2)
Alkaline Phosphatase: 80 U/L (ref 39–117)
BUN: 18 mg/dL (ref 6–23)
CO2: 39 mEq/L — ABNORMAL HIGH (ref 19–32)
Calcium: 9.3 mg/dL (ref 8.4–10.5)
Chloride: 89 mEq/L — ABNORMAL LOW (ref 96–112)
Creatinine, Ser: 1.19 mg/dL (ref 0.40–1.20)
GFR: 48.21 mL/min — ABNORMAL LOW (ref >60.00)
Glucose, Bld: 107 mg/dL — ABNORMAL HIGH (ref 70–99)
Potassium: 2.9 mEq/L — ABNORMAL LOW (ref 3.5–5.1)
Sodium: 137 mEq/L (ref 135–145)
Total Bilirubin: 0.9 mg/dL (ref 0.2–1.2)
Total Protein: 7.2 g/dL (ref 6.0–8.3)

## 2022-03-09 LAB — MAGNESIUM: Magnesium: 1.7 mg/dL (ref 1.5–2.5)

## 2022-03-09 MED ORDER — CYANOCOBALAMIN 1000 MCG/ML IJ SOLN
1000.0000 ug | Freq: Once | INTRAMUSCULAR | Status: AC
  Administered 2022-03-09: 1000 ug via INTRAMUSCULAR

## 2022-03-09 NOTE — Assessment & Plan Note (Signed)
Suspect multifactorial including deconditioning and weight as contributing. She is aware and is working on weight loss to help with health overall and breathing specifically.  ?

## 2022-03-09 NOTE — Assessment & Plan Note (Signed)
Levels were borderline low recently. She is working on weight loss and would benefit from replacement. Given B12 injection today. Advised to continue oral replacement. If she gets significant change in symptoms could continue monthly injection.  ?

## 2022-03-09 NOTE — Assessment & Plan Note (Signed)
She was started back on ozempic by PCP to help assist with weight loss. Reviewed recent HgA1c 6.9 with her today. She is not taking ozempic due to not remembering how to inject herself. She is taking metformin 500 mg BID. Encouraged to restart ozempic and she already has pen at home ozempic '1mg'$ /week.  ?

## 2022-03-09 NOTE — Assessment & Plan Note (Signed)
Her weight is decreasing some and encouraged her to start ozempic 1 mg weekly back to help with continued weight loss.  ?

## 2022-03-09 NOTE — Patient Instructions (Signed)
Make sure to take the torsemide daily and the metolazone weekly without stopping. ? ?Work on going back on the ozempic 1 mg weekly.  ? ? ?

## 2022-03-09 NOTE — Assessment & Plan Note (Signed)
Checking CMP and magnesium due to cramping in legs. She did stop torsemide and metolazone recently for 4-5 days because flexeril made her have urinary incontinence and she did not want to take fluid pill with that. Advised to let her PCP know if she was planning to stop her medications for advice prior to stopping. She does not have evidence for flare today and weight is overall down from last visit with PCP.  ?

## 2022-03-09 NOTE — Progress Notes (Signed)
? ?  Subjective:  ? ?Patient ID: Maria [name], female    DOB: 10-13-1956, 66 y.o.   MRN: [id number] ? ?HPI ?The patient is a complicated 66 YO female coming in for concerns.  ? ?Review of Systems  ?Constitutional:  Positive for activity change and fatigue.  ?HENT: Negative.    ?Eyes: Negative.   ?Respiratory:  Positive for shortness of breath. Negative for cough and chest tightness.   ?Cardiovascular:  Positive for leg swelling. Negative for chest pain and palpitations.  ?Gastrointestinal:  Positive for abdominal distention. Negative for abdominal pain, constipation, diarrhea, nausea and vomiting.  ?Musculoskeletal:  Positive for arthralgias, back pain and myalgias.  ?Skin: Negative.   ?Neurological: Negative.   ?Psychiatric/Behavioral: Negative.    ? ?Objective:  ?Physical Exam ?Constitutional:   ?   Appearance: She is well-developed. She is obese.  ?HENT:  ?   Head: Normocephalic and atraumatic.  ?Cardiovascular:  ?   Rate and Rhythm: Normal rate and regular rhythm.  ?Pulmonary:  ?   Effort: Pulmonary effort is normal. No respiratory distress.  ?   Breath sounds: No wheezing or rales.  ?   Comments: Exam limited due to habitus ?Abdominal:  ?   General: Bowel sounds are normal. There is distension.  ?   Palpations: Abdomen is soft.  ?   Tenderness: There is abdominal tenderness. There is no rebound.  ?Musculoskeletal:     ?   General: Tenderness present.  ?   Cervical back: Normal range of motion.  ?   Right lower leg: Edema present.  ?   Left lower leg: Edema present.  ?Skin: ?   General: Skin is warm and dry.  ?Neurological:  ?   Mental Status: She is alert and oriented to person, place, and time.  ?   Coordination: Coordination abnormal.  ? ? ?Vitals:  ? 03/09/22 1254  ?BP: 132/78  ?Pulse: 74  ?SpO2: 96%  ?Weight: (!) 366 lb 9.6 oz (166.3 kg)  ?Height: '5\' 3"'$  (1.6 m)  ? ? ?This visit occurred during the SARS-CoV-2 public health emergency.  Safety protocols were in place, including screening questions prior to the  visit, additional usage of staff PPE, and extensive cleaning of exam room while observing appropriate contact time as indicated for disinfecting solutions.  ? ?Assessment & Plan:  ? ?

## 2022-03-10 ENCOUNTER — Other Ambulatory Visit: Payer: Self-pay | Admitting: Internal Medicine

## 2022-03-10 MED ORDER — MAGNESIUM 400 MG PO TABS: 400.0000 mg | ORAL_TABLET | Freq: Two times a day (BID) | ORAL | 0 refills | Status: DC

## 2022-03-12 DIAGNOSIS — M25512 Pain in left shoulder: Secondary | ICD-10-CM | POA: Diagnosis not present

## 2022-03-12 DIAGNOSIS — M5136 Other intervertebral disc degeneration, lumbar region: Secondary | ICD-10-CM | POA: Diagnosis not present

## 2022-03-12 DIAGNOSIS — J3089 Other allergic rhinitis: Secondary | ICD-10-CM | POA: Diagnosis not present

## 2022-03-12 DIAGNOSIS — M542 Cervicalgia: Secondary | ICD-10-CM | POA: Diagnosis not present

## 2022-03-12 DIAGNOSIS — G894 Chronic pain syndrome: Secondary | ICD-10-CM | POA: Diagnosis not present

## 2022-03-12 DIAGNOSIS — M25561 Pain in right knee: Secondary | ICD-10-CM | POA: Diagnosis not present

## 2022-03-12 DIAGNOSIS — M25562 Pain in left knee: Secondary | ICD-10-CM | POA: Diagnosis not present

## 2022-03-12 DIAGNOSIS — M79661 Pain in right lower leg: Secondary | ICD-10-CM | POA: Diagnosis not present

## 2022-03-12 DIAGNOSIS — M1711 Unilateral primary osteoarthritis, right knee: Secondary | ICD-10-CM | POA: Diagnosis not present

## 2022-03-12 DIAGNOSIS — J321 Chronic frontal sinusitis: Secondary | ICD-10-CM | POA: Diagnosis not present

## 2022-03-12 DIAGNOSIS — M25551 Pain in right hip: Secondary | ICD-10-CM | POA: Diagnosis not present

## 2022-03-12 DIAGNOSIS — M545 Low back pain, unspecified: Secondary | ICD-10-CM | POA: Diagnosis not present

## 2022-03-12 DIAGNOSIS — Z79891 Long term (current) use of opiate analgesic: Secondary | ICD-10-CM | POA: Diagnosis not present

## 2022-03-12 DIAGNOSIS — M25552 Pain in left hip: Secondary | ICD-10-CM | POA: Diagnosis not present

## 2022-03-16 ENCOUNTER — Ambulatory Visit: Payer: Medicare Other | Admitting: Internal Medicine

## 2022-03-17 ENCOUNTER — Ambulatory Visit: Payer: Medicare Other | Admitting: Cardiology

## 2022-03-17 DIAGNOSIS — N1831 Chronic kidney disease, stage 3a: Secondary | ICD-10-CM | POA: Diagnosis not present

## 2022-03-17 DIAGNOSIS — I129 Hypertensive chronic kidney disease with stage 1 through stage 4 chronic kidney disease, or unspecified chronic kidney disease: Secondary | ICD-10-CM | POA: Diagnosis not present

## 2022-03-17 DIAGNOSIS — E876 Hypokalemia: Secondary | ICD-10-CM | POA: Diagnosis not present

## 2022-03-17 DIAGNOSIS — E1122 Type 2 diabetes mellitus with diabetic chronic kidney disease: Secondary | ICD-10-CM | POA: Diagnosis not present

## 2022-03-17 DIAGNOSIS — I5032 Chronic diastolic (congestive) heart failure: Secondary | ICD-10-CM | POA: Diagnosis not present

## 2022-03-19 ENCOUNTER — Ambulatory Visit: Payer: Commercial Managed Care - HMO

## 2022-03-19 DIAGNOSIS — E669 Obesity, unspecified: Secondary | ICD-10-CM

## 2022-03-19 DIAGNOSIS — I5032 Chronic diastolic (congestive) heart failure: Secondary | ICD-10-CM

## 2022-03-19 DIAGNOSIS — F32A Depression, unspecified: Secondary | ICD-10-CM

## 2022-03-19 NOTE — Patient Instructions (Signed)
Visit Information ? ?Following are the goals we discussed today:  ? ?Track and Manage My Symptoms of Heart Failure  ? ?Timeframe:  Long-Range Goal ?Priority:  High ?Start Date:   08/01/2021                          ?Expected End Date:  12/01/2022                    ? ?Follow Up Date 05/2022 ?  ?- eat more whole grains, fruits and vegetables, lean meats and healthy fats ?- know when to call the doctor ?- track symptoms and what helps feel better or worse  ?  ?Why is this important?   ?You will be able to handle your symptoms better if you keep track of them.  ?Making some simple changes to your lifestyle will help.  ?Eating healthy is one thing you can do to take good care of yourself.  ? ?Plan: Telephone follow up appointment with care management team member scheduled for:  3 months ?The patient has been provided with contact information for the care management team and has been advised to call with any health related questions or concerns.  ? ?Tomasa Blase, PharmD ?Clinical Pharmacist, [facility]  ? ?Please call the care guide team at [phone] if you need to cancel or reschedule your appointment.  ? ?Patient verbalizes understanding of instructions and care plan provided today and agrees to view in [place]. Active MyChart status confirmed with patient.   ? ?

## 2022-03-19 NOTE — Progress Notes (Signed)
? ?Chronic Care Management ?Pharmacy Note ? ?03/19/2022 ?Name:  Maria [name] MRN:  [id number] DOB:  1956-01-24 ? ?Summary: ?-Patient reports compliance with current medications - has restarted ozempic since last visit, was in to nephrologist 03/17/2022 - adjusted torsemide and metolazone dosing  ?-Patient reports weights have been stable, BG averaging 98-115 - denies any issues with medications  ? ? ?Recommendations/Changes made from today's visit: ?-Patient reports labs still pending from nephrology appointment - advised to reach out  to their office to check potassium level on their most recent lab as her previous one on file was low at 2.9 ?-Pt to purchase magnesium over the counter and start as prescribed  ?-Stressed importance of medication adherence, patient to reach out to office to discuss any issues with medications prior to stopping, agreeable to plan  ? ?Subjective: ?Maria [name] is an 66 y.o. year old female who is a primary patient of [name] Reichmann, Hunt Oris, MD.  The CCM team was consulted for assistance with disease management and care coordination needs.   ? ?Engaged with patient by telephone for return visit in response to provider referral for pharmacy case management and/or care coordination services.  ? ?Consent to Services:  ?The patient was given the following information about Chronic Care Management services today, agreed to services, and gave verbal consent: 1. CCM service includes personalized support from designated clinical staff supervised by the primary care provider, including individualized plan of care and coordination with other care providers 2. 24/7 contact phone numbers for assistance for urgent and routine care needs. 3. Service will only be billed when office clinical staff spend 20 minutes or more in a month to coordinate care. 4. Only one practitioner may furnish and bill the service in a calendar month. 5.The patient may stop CCM services at any time (effective at the end of the  month) by phone call to the office staff. 6. The patient will be responsible for cost sharing (co-pay) of up to 20% of the service fee (after annual deductible is met). Patient agreed to services and consent obtained. ? ?Patient Care Team: ?Biagio Borg, MD as PCP - General (Internal Medicine) ?Minus Breeding, MD as PCP - Cardiology (Cardiology) ?Helayne Seminole, MD as Consulting Physician (Otolaryngology) ?Artis Delay, MD as Referring Physician (Internal Medicine) ?[name] Royalty, RN as Case Manager ?Minus Breeding, MD as Consulting Physician (Cardiology) ?Tomasa Blase, [facility] (Pharmacist) ? ?Recent office visits:  ?03/09/2022 - Dr. Sharlet Salina - restart ozempic 30m (was not taking as she had forgotten how to inject), b12 injection given, restart torsemide daily and metolazone once weekly  ?01/30/2022 - Dr. JJenny Reichmann- start vit D, start citalopram 216mdaily, ozempic 59m52meekly  ?12/30/2021 - [facility]  - reduce fluid intake, continue torsemide increased as per cardiology, start ozmepic 0.14m62mekly - f/u in 3 months   ? ?  ?Recent consult visits:  ?12/26/2021 - [facility] f[facility]u after recent CHF exacerbation  ? ?Recent Hospital Visits ?12/18/2021-12/21/2021 - Hospital Admission - CHF exacerbation - on discharge - hold ACE/Arb - continue demadex 40mg69m - f/u with cardiology outpatient  ? ?Objective: ? ?Lab Results  ?Component Value Date  ? CREATININE 1.19 03/09/2022  ? BUN 18 03/09/2022  ? GFR 48.21 (L) 03/09/2022  ? GFRNONAA 52 (L) 12/21/2021  ? GFRAA >60 08/06/2020  ? NA 137 03/09/2022  ? K 2.9 (L) 03/09/2022  ? CALCIUM 9.3 03/09/2022  ? CO2 39 (H) 03/09/2022  ?  GLUCOSE 107 (H) 03/09/2022  ? ? ?Lab Results  ?Component Value Date/Time  ? HGBA1C 6.9 (H) 01/30/2022 01:42 PM  ? HGBA1C 6.8 (H) 12/19/2021 09:48 AM  ? GFR 48.21 (L) 03/09/2022 01:30 PM  ? GFR 58.05 (L) 01/30/2022 01:42 PM  ? MICROALBUR 0.8 01/30/2022 01:42 PM  ?  ?Last diabetic Eye exam:  ?Lab Results  ?Component  Value Date/Time  ? HMDIABEYEEXA No Retinopathy 05/14/2021 11:13 AM  ?  ?Last diabetic Foot exam:  ?No results found for: HMDIABFOOTEX  ? ?Lab Results  ?Component Value Date  ? CHOL 126 01/30/2022  ? HDL 45.40 01/30/2022  ? [place] 56 01/30/2022  ? LDLDIRECT 127.0 01/25/2019  ? TRIG 120.0 01/30/2022  ? CHOLHDL 3 01/30/2022  ? ? ? ?  Latest Ref Rng & Units 03/09/2022  ?  1:30 PM 01/30/2022  ?  1:42 PM 10/16/2021  ?  8:16 PM  ?Hepatic Function  ?Total Protein 6.0 - 8.3 g/dL 7.2   6.9   7.1    ?Albumin 3.5 - 5.2 g/dL 4.1   3.8   4.0    ?AST 0 - 37 U/L _0 ?ALT 0 - 35 U/L _1 ?Alk Phosphatase 39 - 117 U/L 80   78   75    ?Total Bilirubin 0.2 - 1.2 mg/dL 0.9   0.7   0.6    ?Bilirubin, Direct 0.0 - 0.3 mg/dL  0.1     ? ? ?Lab Results  ?Component Value Date/Time  ? TSH 3.19 01/30/2022 01:42 PM  ? TSH 1.45 12/31/2020 02:04 PM  ? ? ? ?  Latest Ref Rng & Units 01/30/2022  ?  1:42 PM 12/19/2021  ?  5:18 AM 12/18/2021  ? 12:32 PM  ?CBC  ?WBC 4.0 - 10.5 K/uL 10.9   7.0   9.5    ?Hemoglobin 12.0 - 15.0 g/dL 12.2   10.9   12.0    ?Hematocrit 36.0 - 46.0 % 36.8   31.9   35.8    ?Platelets 150.0 - 400.0 K/uL 270.0   242   301    ? ? ?Lab Results  ?Component Value Date/Time  ? VD25OH 40.39 01/30/2022 01:42 PM  ? VD25OH 26.86 (L) 07/07/2021 03:10 PM  ? ? ?Clinical ASCVD: No  ?The ASCVD Risk score (Arnett DK, et al., 2019) failed to calculate for the following reasons: ?  The valid total cholesterol range is 130 to 320 mg/dL   ? ? ?  01/30/2022  ?  1:02 PM 01/05/2022  ?  2:15 PM 12/30/2021  ?  4:14 PM  ?Depression screen PHQ 2/9  ?Decreased Interest 2 0 0  ?Down, Depressed, Hopeless 2 0 0  ?PHQ - 2 Score 4 0 0  ?Altered sleeping 0    ?Tired, decreased energy 2    ?Change in appetite 0    ?Feeling bad or failure about yourself  1    ?Trouble concentrating 1    ?Moving slowly or fidgety/restless 1    ?Suicidal thoughts 0    ?PHQ-9 Score 9    ?  ? ?Social History  ? ?Tobacco Use  ?Smoking Status Former  ? Years: 30.00  ?  Types: Cigarettes  ? Quit date: 11/08/2008  ? Years since quitting: 13.3  ?Smokeless Tobacco Never  ?Tobacco Comments  ? quit 10/09  ? ?BP Readings from Last 3 Encounters:  ?03/09/22 132/78  ?01/30/22  134/72  ?12/30/21 (!) 142/70  ? ?Pulse Readings from Last 3 Encounters:  ?03/09/22 74  ?01/30/22 75  ?12/30/21 75  ? ?Wt Readings from Last 3 Encounters:  ?03/09/22 (!) 366 lb 9.6 oz (166.3 kg)  ?01/30/22 (!) 373 lb 4 oz (169.3 kg)  ?12/30/21 (!) 371 lb (168.3 kg)  ? ?BMI Readings from Last 3 Encounters:  ?03/09/22 64.94 kg/m?  ?01/30/22 66.12 kg/m?  ?12/30/21 65.72 kg/m?  ? ? ?Assessment/Interventions: Review of patient past medical history, allergies, medications, health status, including review of consultants reports, laboratory and other test data, was performed as part of comprehensive evaluation and provision of chronic care management services.  ? ?SDOH:  (Social Determinants of Health) assessments and interventions performed: Yes ? ?SDOH Screenings  ? ?Alcohol Screen: Low Risk   ? Last Alcohol Screening Score (AUDIT): 0  ?Depression (PHQ2-9): Medium Risk  ? PHQ-2 Score: 9  ?Financial Resource Strain: Medium Risk  ? Difficulty of Paying Living Expenses: Somewhat hard  ?Food Insecurity: No Food Insecurity  ? Worried About Charity fundraiser in the Last Year: Never true  ? Ran Out of Food in the Last Year: Never true  ?Housing: Low Risk   ? Last Housing Risk Score: 0  ?Physical Activity: Inactive  ? Days of Exercise per Week: 0 days  ? Minutes of Exercise per Session: 0 min  ?Social Connections: Socially Isolated  ? Frequency of Communication with Friends and Family: More than three times a week  ? Frequency of Social Gatherings with Friends and Family: More than three times a week  ? Attends Religious Services: Never  ? Active Member of Clubs or Organizations: No  ? Attends Archivist Meetings: Never  ? Marital Status: Never married  ?Stress: No Stress Concern Present  ? Feeling of Stress : Not at all   ?Tobacco Use: Medium Risk  ? Smoking Tobacco Use: Former  ? Smokeless Tobacco Use: Never  ? Passive Exposure: Not on file  ?Transportation Needs: No Transportation Needs  ? Lack of Transportation (Medical)

## 2022-03-20 DIAGNOSIS — E1169 Type 2 diabetes mellitus with other specified complication: Secondary | ICD-10-CM

## 2022-03-20 DIAGNOSIS — F32A Depression, unspecified: Secondary | ICD-10-CM | POA: Diagnosis not present

## 2022-03-20 DIAGNOSIS — E669 Obesity, unspecified: Secondary | ICD-10-CM

## 2022-03-20 DIAGNOSIS — I5032 Chronic diastolic (congestive) heart failure: Secondary | ICD-10-CM

## 2022-04-03 ENCOUNTER — Ambulatory Visit (INDEPENDENT_AMBULATORY_CARE_PROVIDER_SITE_OTHER): Payer: Medicare Other | Admitting: *Deleted

## 2022-04-03 DIAGNOSIS — E1169 Type 2 diabetes mellitus with other specified complication: Secondary | ICD-10-CM

## 2022-04-03 DIAGNOSIS — I5032 Chronic diastolic (congestive) heart failure: Secondary | ICD-10-CM

## 2022-04-03 NOTE — Patient Instructions (Signed)
Visit Information ? ?[name], thank you for taking time to talk with me today. Please don't hesitate to contact me if I can be of assistance to you before our next scheduled telephone appointment ? ?Below are the goals we discussed today:  ?Patient Self-Care Activities: ?[name] will: ?Take medications as prescribed ?Attend all scheduled provider appointments ?Call pharmacy for medication refills ?Call provider office for new concerns or questions ?Keep up the great work monitoring and writing down on paper daily weights at home: today you reported a weight of 364 lbs ?Continue to follow prescribed diet: low carb/ low sugar; heart healthy/ low salt; and will follow fluid restrictions of 48- 64 ounces per day ?Watch daily for increased feeling of shortness of breath, swelling in feet, ankles and legs every day: if you have an increased amount of shortness of breath or swelling, make a note of it and let your cardiology provider know right away if it persists or gets worse ?Continue to keep legs up while sitting and wear compression stockings during daytime hours ?Stay as active as possible without over-doing: rest if you start to feel tired or "winded"/ short of breath ?Continue to monitor and write down on paper daily fasting blood sugars several times each week: the blood sugars we reviewed today are all in very good range ?Continue your efforts to prevent falls- use your walker when you are walking ? ?Our next scheduled telephone follow up visit/ appointment is scheduled on:   Tuesday, May 05, 2022 at 2:15 pm- This is a PHONE [place] appointment ? ?If you need to cancel or re-schedule our visit, please call [phone] and our care guide team will be happy to assist you. ?  ?I look forward to hearing about your progress. ?  ?Maria Rack, RN, BSN, CCRN Alumnus ?[place] ?[phone]: direct office ? ?If you are experiencing a Mental Health or [place] or need someone to talk to, please  ?call the Suicide and Crisis Lifeline: 988 ?call the [place] National Suicide Prevention Lifeline: 317-705-8600 or TTY: 614-336-2694 TTY (470) 035-5477) to talk to a trained counselor ?call 1-800-273-TALK (toll free, 24 hour hotline) ?go to [facility] [address] [place] [phone]) ?call 911  ? ?Patient verbalizes understanding of instructions and care plan provided today and agrees to view in [place]. Active MyChart status confirmed with patient ? ?Hypoglycemia ?Hypoglycemia occurs when the level of sugar (glucose) in the blood is too low. Hypoglycemia can happen in people who have or do not have diabetes. It can develop quickly, and it can be a medical emergency. For most people, a blood glucose level below 70 mg/dL (3.9 mmol/L) is considered hypoglycemia. ?Glucose is a type of sugar that provides the body's main source of energy. Certain hormones (insulin and glucagon) control the level of glucose in the blood. Insulin lowers blood glucose, and glucagon raises blood glucose. Hypoglycemia can result from having too much insulin in the bloodstream, or from not eating enough food that contains glucose. You may also have reactive hypoglycemia, which happens within 4 hours after eating a meal. ?What are the causes? ?Hypoglycemia occurs most often in people who have diabetes and may be caused by: ?Diabetes medicine. ?Not eating enough, or not eating often enough. ?Increased physical activity. ?Drinking alcohol on an empty stomach. ?If you do not have diabetes, hypoglycemia may be caused by: ?A tumor in the pancreas. ?Not eating enough, or not eating for  long periods at a time (fasting). ?A severe infection or illness. ?Problems after having bariatric surgery. ?Organ failure, such as kidney or liver failure. ?Certain medicines. ?What increases the risk? ?Hypoglycemia is more likely to develop in people who: ?Have diabetes and  take medicines to lower blood glucose. ?Abuse alcohol. ?Have a severe illness. ?What are the signs or symptoms? ?Symptoms vary depending on whether the condition is mild, moderate, or severe. ?Mild hypoglycemia ?Hunger. ?Sweating and feeling clammy. ?Dizziness or feeling light-headed. ?Sleepiness or restless sleep. ?Nausea. ?Increased heart rate. ?Headache. ?Blurry vision. ?Mood changes, such as irritability or anxiety. ?Tingling or numbness around the mouth, lips, or tongue. ?Moderate hypoglycemia ?Confusion and poor judgment. ?Behavior changes. ?Weakness. ?Irregular heartbeat. ?A change in coordination. ?Severe hypoglycemia ?Severe hypoglycemia is a medical emergency. It can cause: ?Fainting. ?Seizures. ?Loss of consciousness (coma). ?Death. ?How is this diagnosed? ?Hypoglycemia is diagnosed with a blood test to measure your blood glucose level. This blood test is done while you are having symptoms. Your health care provider may also do a physical exam and review your medical history. ?How is this treated? ?This condition can be treated by immediately eating or drinking something that contains sugar with 15 grams of fast-acting carbohydrate, such as: ?4 oz (120 mL) of fruit juice. ?4 oz (120 mL) of regular soda (not diet soda). ?Several pieces of hard candy. Check food labels to find out how many pieces to eat for 15 grams. ?1 Tbsp (15 mL) of sugar or honey. ?4 glucose tablets. ?1 tube of glucose gel. ?Treating hypoglycemia if you have diabetes ?If you are alert and able to swallow safely, follow the 15:15 rule: ?Take 15 grams of a fast-acting carbohydrate. Talk with your health care provider about how much you should take. Options for getting 15 grams of fast-acting carbohydrate include: ?Glucose tablets (take 4 tablets). ?Several pieces of hard candy. Check food labels to find out how many pieces to eat for 15 grams. ?4 oz (120 mL) of fruit juice. ?4 oz (120 mL) of regular soda (not diet soda). ?1 Tbsp (15 mL)  of sugar or honey. ?1 tube of glucose gel. ?Check your blood glucose 15 minutes after you take the carbohydrate. ?If the repeat blood glucose level is still at or below 70 mg/dL (3.9 mmol/L), take 15 grams of a carbohydrate again. ?If your blood glucose level does not increase above 70 mg/dL (3.9 mmol/L) after 3 tries, seek emergency medical care. ?After your blood glucose level returns to normal, eat a meal or a snack within 1 hour. ? ?Treating severe hypoglycemia ?Severe hypoglycemia is when your blood glucose level is below 54 mg/dL (3 mmol/L). Severe hypoglycemia is a medical emergency. Get medical help right away. ?If you have severe hypoglycemia and you cannot eat or drink, you will need to be given glucagon. A family member or close friend should learn how to check your blood glucose and how to give you glucagon. Ask your health care provider if you need to have an emergency glucagon kit available. ?Severe hypoglycemia may need to be treated in a hospital. The treatment may include getting glucose through an IV. You may also need treatment for the cause of your hypoglycemia. ?Follow these instructions at home: ? ?General instructions ?Take over-the-counter and prescription medicines only as told by your health care provider. ?Monitor your blood glucose as told by your health care provider. ?If you drink alcohol: ?Limit how much you have to: ?0-1 drink a day for women who are not pregnant. ?  0-2 drinks a day for men. ?Know how much alcohol is in your drink. In the [place] one drink equals one 12 oz bottle of beer (355 mL), one 5 oz glass of wine (148 mL), or one 1? oz glass of hard liquor (44 mL). ?Be sure to eat food along with drinking alcohol. ?Be aware that alcohol is absorbed quickly and may have lingering effects that may result in hypoglycemia later. Be sure to do ongoing glucose monitoring. ?Keep all follow-up visits. This is important. ?If you have diabetes: ?Always have a fast-acting carbohydrate (15  grams) option with you to treat low blood glucose. ?Follow your diabetes management plan as directed by your health care provider. Make sure you: ?Know the symptoms of hypoglycemia. It is important to tr

## 2022-04-03 NOTE — Chronic Care Management (AMB) (Signed)
?Chronic Care Management  ? ?CCM RN Visit Note ? ?04/03/2022 ?Name: Maria [name] MRN: [id number] DOB: December 13, 1956 ? ?Subjective: ?Maria [name] is a 66 y.o. year old female who is a primary care patient of Biagio Borg, MD. The care management team was consulted for assistance with disease management and care coordination needs.   ? ?Engaged with patient by telephone for follow up visit in response to provider referral for case management and/or care coordination services.  ? ?Consent to Services:  ?The patient was given information about Chronic Care Management services, agreed to services, and gave verbal consent prior to initiation of services.  Please see initial visit note for detailed documentation.  ?Patient agreed to services and verbal consent obtained.  ? ?Assessment: Review of patient past medical history, allergies, medications, health status, including review of consultants reports, laboratory and other test data, was performed as part of comprehensive evaluation and provision of chronic care management services.  ? ?SDOH (Social Determinants of Health) assessments and interventions performed:  ?SDOH Interventions   ? ?Flowsheet Row Most Recent Value  ?SDOH Interventions   ?Food Insecurity Interventions Intervention Not Indicated  [reports continues to get food stamps,  denies food insecurity]  ?Housing Interventions Intervention Not Indicated  [continues to reside alone in first floor one level apartment,  has lived in apartment for 17 years,  reports she is able to navigate exterior apartment complex steps with her assistive devices]  ?Transportation Interventions Intervention Not Indicated  [uses transportation service provided by insurance provider]  ? ?  ?CCM Care Plan ? ?Allergies  ?Allergen Reactions  ? Amoxicillin-Pot Clavulanate Nausea And Vomiting  ?  Projectile vomiting ?Did it involve swelling of the face/tongue/throat, SOB, or low BP? No ?Did it involve sudden or severe rash/hives, skin  peeling, or any reaction on the inside of your mouth or nose? No ?Did you need to seek medical attention at a hospital or doctor's office? No ?When did it last happen?      20-30 years ?If all above answers are ?NO?, may proceed with cephalosporin use. ?  ? Adhesive [Tape] Other (See Comments)  ?  Tears skin off - use paper tape   ? Codeine Other (See Comments)  ?  hallucinations  ? Crestor [Rosuvastatin Calcium]   ?  Severe muscle cramps  ? Morphine And Related Nausea And Vomiting and Other (See Comments)  ?  Migraine headaches  ? Vicodin [Hydrocodone-Acetaminophen] Other (See Comments)  ?  hallucinations  ? Latex Rash  ? ?Outpatient Encounter Medications as of 04/03/2022  ?Medication Sig  ? Accu-Chek Softclix Lancets lancets Use as directed up to 4 times daily (Patient taking differently: 1 each by Other route in the morning, at noon, in the evening, and at bedtime.)  ? acetaminophen (TYLENOL) 325 MG tablet Take 650 mg by mouth every 6 (six) hours as needed for headache or fever (pain).  ? albuterol (PROAIR HFA) 108 (90 Base) MCG/ACT inhaler Inhale 2 puffs into the lungs every 6 (six) hours as needed for wheezing or shortness of breath.  ? Blood Glucose Monitoring Suppl (ACCU-CHEK GUIDE) w/Device KIT Use as directed (Patient taking differently: 1 each by Other route as directed.)  ? cholecalciferol (VITAMIN D3) 25 MCG (1000 UNIT) tablet Take 2,000 Units by mouth daily.  ? citalopram (CELEXA) 20 MG tablet Take 1 tablet (20 mg total) by mouth daily.  ? clonazePAM (KLONOPIN) 1 MG tablet Take 1 tablet (1 mg total) by mouth 2 (two) times daily as  needed for anxiety.  ? cyclobenzaprine (FLEXERIL) 10 MG tablet TAKE 1 TABLET BY MOUTH 2 TIMES A DAY AS NEEDED FOR MUSCLE SPASMS (Patient taking differently: Take 10 mg by mouth daily as needed. TAKE 1 TABLET BY MOUTH 2 TIMES A DAY AS NEEDED FOR MUSCLE SPASMS)  ? Fexofenadine HCl (ALLEGRA PO) Take 180 mg by mouth daily as needed (allergies).  ? glucose blood (ACCU-CHEK GUIDE)  test strip Use as instructed up to 4 times daily  ? Magnesium 400 MG TABS Take 400 mg by mouth in the morning and at bedtime. (Patient not taking: Reported on 03/19/2022)  ? Metamucil Fiber CHEW Chew 3 tablets by mouth at bedtime.  ? metolazone (ZAROXOLYN) 5 MG tablet Take 5 mg by mouth 2 (two) times a week. Per nephrologist  ? metoprolol tartrate (LOPRESSOR) 25 MG tablet Take 25 mg by mouth 2 (two) times daily.  ? NARCAN 4 MG/0.1ML LIQD nasal spray kit Place 0.4 mg into the nose daily as needed (opioid overdose).  ? ondansetron (ZOFRAN ODT) 4 MG disintegrating tablet Take 1 tablet (4 mg total) by mouth every 4 (four) hours as needed for nausea or vomiting.  ? Oxycodone HCl 10 MG TABS Take 1 tablet (10 mg total) 4 (four) times daily by mouth. Per [facility] (Patient taking differently: Take 10 mg by mouth every 4 (four) hours as needed (pain). Per [facility])  ? pantoprazole (PROTONIX) 40 MG tablet TAKE ONE TABLET BY MOUTH DAILY  ? Polyethyl Glycol-Propyl Glycol 0.4-0.3 % SOLN Apply 1 drop to eye daily as needed (dry eyes).  ? polyethylene glycol powder (GLYCOLAX/MIRALAX) 17 GM/SCOOP powder Take 17 g by mouth daily as needed for mild constipation. (Patient not taking: Reported on 03/19/2022)  ? potassium chloride SA (KLOR-CON) 20 MEQ tablet Take 1 tablet (20 mEq total) by mouth 2 (two) times daily.  ? pramipexole (MIRAPEX) 0.5 MG tablet Take 1 tablet (0.5 mg total) by mouth 3 (three) times daily.  ? Semaglutide, 1 MG/DOSE, 4 MG/3ML SOPN Inject 1 mg as directed once a week.  ? torsemide (DEMADEX) 20 MG tablet Take 2 tablets in the morning and 1 tablet in the afternoon. (Patient taking differently: 67m twice daily - per nephrologist)  ? [DISCONTINUED] DULoxetine (CYMBALTA) 60 MG capsule Take 60 mg by mouth daily.    ? ?No facility-administered encounter medications on file as of 04/03/2022.  ? ?Patient Active Problem List  ? Diagnosis Date Noted  ? Dyspnea 12/19/2021  ? Leg swelling 11/20/2021  ?  Bilateral knee pain 10/29/2021  ? Hypernatremia 09/24/2021  ? CKD (chronic [place] disease), stage IIIa [place] 09/24/2021  ? Sacral decubitus ulcer, stage II ([facility] 09/20/2021  ? Acute on chronic diastolic CHF (congestive heart failure) ([facility] 09/19/2021  ? Nail disorder 07/07/2021  ? Pre-ulcerative corn or callous 07/07/2021  ? Rash 07/07/2021  ? Diastolic congestive heart failure ([facility] 03/17/2021  ? Diabetes mellitus type 2 in obese ([facility] 03/11/2021  ? Vitamin D deficiency 01/01/2021  ? Medial epicondylitis 12/31/2020  ? Aortic atherosclerosis ([facility] 09/26/2020  ? Umbilical hernia 176/22/6333 ? Constipation 08/05/2020  ? Arthritis 06/12/2020  ? Hoarseness 05/01/2020  ? Vitamin B 12 deficiency 01/31/2020  ? Rosacea 01/31/2020  ? Neck swelling 01/31/2020  ? History of colonic polyps   ? Benign neoplasm of [place]   ? Nausea 08/31/2019  ? Throat pain 07/26/2019  ? HLD (hyperlipidemia) 05/24/2019  ? Leg cramps 05/24/2019  ? Lump in neck 05/24/2019  ? Left thyroid nodule 01/25/2019  ?  Jerking movements of extremities 07/20/2018  ? Balance disorder 07/20/2018  ? Right knee pain 03/21/2018  ? OSA (obstructive sleep apnea) 03/21/2018  ? Oxygen desaturation 03/21/2018  ? Urinary symptom or sign 03/21/2018  ? Acquired lymphedema 01/25/2018  ? Gait disorder 01/25/2018  ? Dysphonia 12/07/2017  ? Encounter for well adult exam with abnormal findings 11/08/2017  ? 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 Wissmann polyp 08/11/2016  ? Abdominal pain, epigastric   ? Dysphagia   ? Guitron cancer screening   ? ACE-inhibitor cough 12/05/2015  ? Restless legs syndrome 10/08/2015  ? Venous stasis dermatitis of both lower extremities 04/26/2015  ? 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/17/2014  ? AR (allergic rhinitis) 10/17/2014  ? Fatty liver 10/17/2014  ? Chronic pain syndrome 09/19/2014  ? Post-traumatic osteoarthritis of both knees 09/19/2014  ? Spinal stenosis 09/19/2014  ? Depression, major, single episode, complete remission ([place]) 09/19/2014   ? Narcotic dependence ([place]) 09/19/2014  ? Spondylosis of 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 03/09/2012  ? Lumbar disc disease 03/09/2012  ? Chronic pain 03/09/2012  ? Morbid obesity ([place])

## 2022-04-08 ENCOUNTER — Other Ambulatory Visit: Payer: Self-pay | Admitting: Anesthesiology

## 2022-04-08 ENCOUNTER — Ambulatory Visit
Admission: RE | Admit: 2022-04-08 | Discharge: 2022-04-08 | Disposition: A | Discharge status: Home / Self Care | Payer: Medicare Other | Source: Ambulatory Visit | Attending: Anesthesiology | Admitting: Anesthesiology

## 2022-04-08 DIAGNOSIS — M545 Low back pain, unspecified: Secondary | ICD-10-CM | POA: Diagnosis not present

## 2022-04-08 DIAGNOSIS — M5136 Other intervertebral disc degeneration, lumbar region: Secondary | ICD-10-CM

## 2022-04-09 DIAGNOSIS — M25562 Pain in left knee: Secondary | ICD-10-CM | POA: Diagnosis not present

## 2022-04-09 DIAGNOSIS — G894 Chronic pain syndrome: Secondary | ICD-10-CM | POA: Diagnosis not present

## 2022-04-09 DIAGNOSIS — M545 Low back pain, unspecified: Secondary | ICD-10-CM | POA: Diagnosis not present

## 2022-04-09 DIAGNOSIS — Z79891 Long term (current) use of opiate analgesic: Secondary | ICD-10-CM | POA: Diagnosis not present

## 2022-04-09 DIAGNOSIS — M1711 Unilateral primary osteoarthritis, right knee: Secondary | ICD-10-CM | POA: Diagnosis not present

## 2022-04-09 DIAGNOSIS — M25551 Pain in right hip: Secondary | ICD-10-CM | POA: Diagnosis not present

## 2022-04-09 DIAGNOSIS — M5136 Other intervertebral disc degeneration, lumbar region: Secondary | ICD-10-CM | POA: Diagnosis not present

## 2022-04-09 DIAGNOSIS — M79661 Pain in right lower leg: Secondary | ICD-10-CM | POA: Diagnosis not present

## 2022-04-09 DIAGNOSIS — M25561 Pain in right knee: Secondary | ICD-10-CM | POA: Diagnosis not present

## 2022-04-09 DIAGNOSIS — M25512 Pain in left shoulder: Secondary | ICD-10-CM | POA: Diagnosis not present

## 2022-04-09 DIAGNOSIS — M25552 Pain in left hip: Secondary | ICD-10-CM | POA: Diagnosis not present

## 2022-04-15 ENCOUNTER — Encounter: Payer: Medicare Other | Attending: Internal Medicine | Admitting: Internal Medicine

## 2022-04-15 DIAGNOSIS — N1831 Chronic kidney disease, stage 3a: Secondary | ICD-10-CM | POA: Insufficient documentation

## 2022-04-15 DIAGNOSIS — I130 Hypertensive heart and chronic kidney disease with heart failure and stage 1 through stage 4 chronic kidney disease, or unspecified chronic kidney disease: Secondary | ICD-10-CM | POA: Insufficient documentation

## 2022-04-15 DIAGNOSIS — I890 Lymphedema, not elsewhere classified: Secondary | ICD-10-CM | POA: Diagnosis not present

## 2022-04-15 DIAGNOSIS — I5032 Chronic diastolic (congestive) heart failure: Secondary | ICD-10-CM | POA: Diagnosis not present

## 2022-04-15 DIAGNOSIS — E1122 Type 2 diabetes mellitus with diabetic chronic kidney disease: Secondary | ICD-10-CM | POA: Insufficient documentation

## 2022-04-15 DIAGNOSIS — Z87891 Personal history of nicotine dependence: Secondary | ICD-10-CM | POA: Diagnosis not present

## 2022-04-15 NOTE — Progress Notes (Addendum)
Maria [name], [name] ([id number]) ?Visit Report for 04/15/2022 ?Allergy List Details ?Patient Name: Maria [name], [name] ?Date of Service: 04/15/2022 12:45 PM ?Medical Record Number: [id number] ?Patient Account Number: [id number] ?Date of Birth/Sex: 02/03/1956 (66 y.o. F) ?Treating RN: Levora Dredge ?Primary Care Theoden Mauch: Cathlean Cower Other Clinician: ?Referring Octavis Sheeler: Harrie Jeans ?Treating Melodi Happel/Extender: Kalman Shan ?Weeks in Treatment: 0 ?Allergies ?Active Allergies ?amoxicillin ?Reaction: nausea and vomiting ?Crestor ?Reaction: muscle cramps ?Vicodin ?Reaction: hallucinations ?latex ?Reaction: rash ?codeine ?Reaction: hallucinations ?morphine ?Reaction: migraine headaches ?adhesive tape ?Allergy Notes ?Electronic Signature(s) ?Signed: 04/15/2022 4:30:13 PM By: Levora Dredge ?Entered By: Levora Dredge on 04/15/2022 12:56:38 ?Maria [name], [name] ([id number]) ?-------------------------------------------------------------------------------- ?Arrival Information Details ?Patient Name: Beutler, Naira A. ?Date of Service: 04/15/2022 12:45 PM ?Medical Record Number: [id number] ?Patient Account Number: [id number] ?Date of Birth/Sex: 1956-06-28 (66 y.o. F) ?Treating RN: Levora Dredge ?Primary Care Loza Prell: Cathlean Cower Other Clinician: ?Referring Kabe Mckoy: Harrie Jeans ?Treating Trinidi Toppins/Extender: Kalman Shan ?Weeks in Treatment: 0 ?Visit Information ?Patient Arrived: Gilford Rile ?Arrival Time: 12:52 ?Accompanied By: self ?Transfer Assistance: None ?Patient Identification Verified: Yes ?Secondary Verification Process Completed: Yes ?Patient Has Alerts: Yes ?Patient Alerts: type 2 diabetic ?Electronic Signature(s) ?Signed: 04/15/2022 4:30:13 PM By: Levora Dredge ?Entered By: Levora Dredge on 04/15/2022 13:05:35 ?Maria [name], [name] ([id number]) ?-------------------------------------------------------------------------------- ?Clinic Level of Care Assessment Details ?Patient Name: [name], [name] ?Date of Service:  04/15/2022 12:45 PM ?Medical Record Number: [id number] ?Patient Account Number: [id number] ?Date of Birth/Sex: 1956-09-19 (66 y.o. F) ?Treating RN: Levora Dredge ?Primary Care Birdie Beveridge: Cathlean Cower Other Clinician: ?Referring Grisela Mesch: Harrie Jeans ?Treating Tyrique Sporn/Extender: Kalman Shan ?Weeks in Treatment: 0 ?Clinic Level of Care Assessment Items ?TOOL 2 Quantity Score ?'[]'$  - Use when only an EandM is performed on the INITIAL visit 0 ?ASSESSMENTS - Nursing Assessment / Reassessment ?X - General Physical Exam (combine w/ comprehensive assessment (listed just below) when performed on new ?1 20 ?pt. evals) ?X- 1 25 ?Comprehensive Assessment (HX, ROS, Risk Assessments, Wounds Hx, etc.) ?ASSESSMENTS - Wound and Skin Assessment / Reassessment ?'[]'$  - Simple Wound Assessment / Reassessment - one wound 0 ?'[]'$  - 0 ?Complex Wound Assessment / Reassessment - multiple wounds ?X- 1 10 ?Dermatologic / Skin Assessment (not related to wound area) ?ASSESSMENTS - Ostomy and/or Continence Assessment and Care ?'[]'$  - Incontinence Assessment and Management 0 ?'[]'$  - 0 ?Ostomy Care Assessment and Management (repouching, etc.) ?PROCESS - Coordination of Care ?X - Simple Patient / Family Education for ongoing care 1 15 ?'[]'$  - 0 ?Complex (extensive) Patient / Family Education for ongoing care ?X- 1 10 ?Staff obtains Consents, Records, Test Results / Process Orders ?'[]'$  - 0 ?Staff telephones HHA, Nursing Homes / Clarify orders / etc ?'[]'$  - 0 ?Routine Transfer to another Facility (non-emergent condition) ?'[]'$  - 0 ?Routine Hospital Admission (non-emergent condition) ?X- 1 15 ?New Admissions / Biomedical engineer / Ordering NPWT, Apligraf, etc. ?'[]'$  - 0 ?Emergency Hospital Admission (emergent condition) ?X- 1 10 ?Simple Discharge Coordination ?'[]'$  - 0 ?Complex (extensive) Discharge Coordination ?PROCESS - Special Needs ?'[]'$  - Pediatric / Minor Patient Management 0 ?'[]'$  - 0 ?Isolation Patient Management ?'[]'$  - 0 ?Hearing / Language / Visual special  needs ?'[]'$  - 0 ?Assessment of Community assistance (transportation, D/C planning, etc.) ?'[]'$  - 0 ?Additional assistance / Altered mentation ?'[]'$  - 0 ?Support Surface(s) Assessment (bed, cushion, seat, etc.) ?INTERVENTIONS - Wound Cleansing / Measurement ?'[]'$  - Wound Imaging (photographs - any number of wounds) 0 ?'[]'$  - 0 ?Wound Tracing (instead of photographs) ?'[]'$  - 0 ?Simple Wound Measurement - one wound ?'[]'$  -  0 ?Complex Wound Measurement - multiple wounds ?Maria [name], [name] ([id number]) ?'[]'$  - 0 ?Simple Wound Cleansing - one wound ?'[]'$  - 0 ?Complex Wound Cleansing - multiple wounds ?INTERVENTIONS - Wound Dressings ?'[]'$  - Small Wound Dressing one or multiple wounds 0 ?'[]'$  - 0 ?Medium Wound Dressing one or multiple wounds ?'[]'$  - 0 ?Large Wound Dressing one or multiple wounds ?'[]'$  - 0 ?Application of Medications - injection ?INTERVENTIONS - Miscellaneous ?'[]'$  - External ear exam 0 ?'[]'$  - 0 ?Specimen Collection (cultures, biopsies, blood, body fluids, etc.) ?'[]'$  - 0 ?Specimen(s) / Culture(s) sent or taken to Lab for analysis ?'[]'$  - 0 ?Patient Transfer (multiple staff / Civil Service fast streamer / Similar devices) ?'[]'$  - 0 ?Simple Staple / Suture removal (25 or less) ?'[]'$  - 0 ?Complex Staple / Suture removal (26 or more) ?'[]'$  - 0 ?Hypo / Hyperglycemic Management (close monitor of Blood Glucose) ?X- 1 15 ?Ankle / Brachial Index (ABI) - do not check if billed separately ?Has the patient been seen at the hospital within the last three years: Yes ?Total Score: 120 ?Level Of Care: New/Established - Level ?4 ?Electronic Signature(s) ?Signed: 04/16/2022 4:44:20 PM By: Levora Dredge ?Entered By: Levora Dredge on 04/16/2022 08:57:34 ?Maria [name], [name] ([id number]) ?-------------------------------------------------------------------------------- ?Encounter Discharge Information Details ?Patient Name: Maria [name], [name] ?Date of Service: 04/15/2022 12:45 PM ?Medical Record Number: [id number] ?Patient Account Number: [id number] ?Date of Birth/Sex: December 17, 1956 (66 y.o.  F) ?Treating RN: Levora Dredge ?Primary Care Chyler Creely: Cathlean Cower Other Clinician: ?Referring Wendell Nicoson: Harrie Jeans ?Treating Jeweliana Dudgeon/Extender: Kalman Shan ?Weeks in Treatment: 0 ?Encounter Discharge Information Items ?Discharge Condition: Stable ?Ambulatory Status: Gilford Rile ?Discharge Destination: Home ?Transportation: Other ?Accompanied By: self ?Schedule Follow-up Appointment: No ?Clinical Summary of Care: Patient Declined ?Electronic Signature(s) ?Signed: 04/16/2022 8:58:56 AM By: Levora Dredge ?Entered By: Levora Dredge on 04/16/2022 08:58:56 ?Maria [name], [name] ([id number]) ?-------------------------------------------------------------------------------- ?Lower Extremity Assessment Details ?Patient Name: Maria [name], [name] ?Date of Service: 04/15/2022 12:45 PM ?Medical Record Number: [id number] ?Patient Account Number: [id number] ?Date of Birth/Sex: Feb 05, 1956 (66 y.o. F) ?Treating RN: Levora Dredge ?Primary Care Saulo Anthis: Cathlean Cower Other Clinician: ?Referring Rieley Hausman: Harrie Jeans ?Treating Thailyn Khalid/Extender: Kalman Shan ?Weeks in Treatment: 0 ?Edema Assessment ?Assessed: [Left: No] [Right: No] ?Edema: [Left: Yes] [Right: Yes] ?Calf ?Left: Right: ?Point of Measurement: 30 cm From Medial Instep 55 cm 55.5 cm ?Ankle ?Left: Right: ?Point of Measurement: 8 cm From Medial Instep 36.5 cm 34.8 cm ?Vascular Assessment ?Pulses: ?Dorsalis Pedis ?Palpable: [Left:Yes] [Right:Yes] ?Posterior Tibial ?Palpable: [Left:Yes] [Right:Yes] ?Blood Pressure: ?Brachial: [Left:120] [Right:120] ?Dorsalis Pedis: 130 ?[Left:Dorsalis Pedis: 440] ?Ankle: ?Posterior Tibial: 110 ?[Left:Posterior Tibial: 130 1.08] [Right:1.08] ?Electronic Signature(s) ?Signed: 04/15/2022 4:30:13 PM By: Levora Dredge ?Entered By: Levora Dredge on 04/15/2022 13:22:38 ?Maria [name], [name] ([id number]) ?-------------------------------------------------------------------------------- ?Multi Wound Chart Details ?Patient Name: Maria [name], [name] ?Date  of Service: 04/15/2022 12:45 PM ?Medical Record Number: [id number] ?Patient Account Number: [id number] ?Date of Birth/Sex: November 27, 1956 (66 y.o. F) ?Treating RN: Levora Dredge ?Primary Care Elisse Pennick: Cathlean Cower

## 2022-04-15 NOTE — Progress Notes (Signed)
Sherrin, Jyrah A. ([id number]) ?Visit Report for 04/15/2022 ?Abuse Risk Screen Details ?Patient Name: Maria [name], [name] ?Date of Service: 04/15/2022 12:45 PM ?Medical Record Number: [id number] ?Patient Account Number: [id number] ?Date of Birth/Sex: [birth year] (66 y.o. F) ?Treating RN: Levora Dredge ?Primary Care Dantavious Snowball: Cathlean Cower Other Clinician: ?Referring Joselito Fieldhouse: Harrie Jeans ?Treating Dwain Huhn/Extender: Kalman Shan ?Weeks in Treatment: 0 ?Abuse Risk Screen Items ?Answer ?ABUSE RISK SCREEN: ?Has anyone close to you tried to hurt or harm you recentlyo No ?Do you feel uncomfortable with anyone in your familyo No ?Has anyone forced you do things that you didnot want to doo No ?Electronic Signature(s) ?Signed: 04/15/2022 4:30:13 PM By: Levora Dredge ?Entered By: Levora Dredge on 04/15/2022 13:02:50 ?Halberstadt, Beula A. ([id number]) ?-------------------------------------------------------------------------------- ?Activities of Daily Living Details ?Patient Name: Maria [name], [name] ?Date of Service: 04/15/2022 12:45 PM ?Medical Record Number: [id number] ?Patient Account Number: [id number] ?Date of Birth/Sex: 07/17/1956 (66 y.o. F) ?Treating RN: Levora Dredge ?Primary Care Juergen Hardenbrook: Cathlean Cower Other Clinician: ?Referring Dayana Dalporto: Harrie Jeans ?Treating Baani Bober/Extender: Kalman Shan ?Weeks in Treatment: 0 ?Activities of Daily Living Items ?Answer ?Activities of Daily Living (Please select one for each item) ?Drive Automobile Not Able ?Take Medications Completely Able ?Use Telephone Completely Able ?Care for Appearance Completely Able ?Use Toilet Completely Able ?Bath / Shower Completely Able ?Dress Self Completely Able ?Feed Self Completely Able ?Walk Completely Able ?Get In / Out Bed Completely Able ?Housework Completely Able ?Prepare Meals Completely Able ?Handle Money Completely Able ?Shop for Self Completely Able ?Electronic Signature(s) ?Signed: 04/15/2022 4:30:13 PM By: Levora Dredge ?Entered By:  Levora Dredge on 04/15/2022 13:03:21 ?Caltagirone, Samanthia A. ([id number]) ?-------------------------------------------------------------------------------- ?Education Screening Details ?Patient Name: Maria [name], [name] ?Date of Service: 04/15/2022 12:45 PM ?Medical Record Number: [id number] ?Patient Account Number: [id number] ?Date of Birth/Sex: 1956-06-02 (66 y.o. F) ?Treating RN: Levora Dredge ?Primary Care Elice Crigger: Cathlean Cower Other Clinician: ?Referring Deb Loudin: Harrie Jeans ?Treating Jamelah Sitzer/Extender: Kalman Shan ?Weeks in Treatment: 0 ?Learning Preferences/Education Level/Primary Language ?Learning Preference: Explanation, Demonstration, Video, Communication Board, Printed Material ?Preferred Language: English ?Cognitive Barrier ?Language Barrier: No ?Translator Needed: No ?Memory Deficit: No ?Emotional Barrier: No ?Cultural/Religious Beliefs Affecting Medical Care: No ?Physical Barrier ?Impaired Vision: Yes Glasses ?Impaired Hearing: No ?Decreased Hand dexterity: No ?Knowledge/Comprehension ?Knowledge Level: High ?Comprehension Level: High ?Ability to understand written instructions: High ?Ability to understand verbal instructions: High ?Motivation ?Anxiety Level: Calm ?Cooperation: Cooperative ?Education Importance: Acknowledges Need ?Interest in Health Problems: Asks Questions ?Perception: Coherent ?Willingness to Engage in Self-Management ?High ?Activities: ?Readiness to Engage in Self-Management ?High ?Activities: ?Electronic Signature(s) ?Signed: 04/15/2022 4:30:13 PM By: Levora Dredge ?Entered By: Levora Dredge on 04/15/2022 13:03:50 ?Maria [name], [name] ([id number]) ?-------------------------------------------------------------------------------- ?Fall Risk Assessment Details ?Patient Name: Maria [name], [name] ?Date of Service: 04/15/2022 12:45 PM ?Medical Record Number: [id number] ?Patient Account Number: [id number] ?Date of Birth/Sex: March 10, 1956 (66 y.o. F) ?Treating RN: Levora Dredge ?Primary Care  Elvyn Krohn: Cathlean Cower Other Clinician: ?Referring Dmarco Baldus: Harrie Jeans ?Treating Esme Durkin/Extender: Kalman Shan ?Weeks in Treatment: 0 ?Fall Risk Assessment Items ?Have you had 2 or more falls in the last 12 monthso 0 No ?Have you had any fall that resulted in injury in the last 12 monthso 0 No ?FALLS RISK SCREEN ?History of falling - immediate or within 3 months 25 Yes ?Secondary diagnosis (Do you have 2 or more medical diagnoseso) 0 No ?Ambulatory aid ?None/bed rest/wheelchair/nurse 0 No ?Crutches/cane/walker 15 Yes ?Furniture 0 No ?Intravenous therapy Access/Saline/Heparin Lock 0 No ?Gait/Transferring ?Normal/ bed rest/ wheelchair 0 No ?Weak (short steps with or without shuffle, stooped but able  to lift head while walking, may ?10 Yes ?seek support from furniture) ?Impaired (short steps with shuffle, may have difficulty arising from chair, head down, impaired ?0 No ?balance) ?Mental Status ?Oriented to own ability 0 Yes ?Electronic Signature(s) ?Signed: 04/15/2022 4:30:13 PM By: Levora Dredge ?Entered By: Levora Dredge on 04/15/2022 13:04:43 ?Maria [name], [name] ([id number]) ?-------------------------------------------------------------------------------- ?Foot Assessment Details ?Patient Name: Maria [name], [name] ?Date of Service: 04/15/2022 12:45 PM ?Medical Record Number: [id number] ?Patient Account Number: [id number] ?Date of Birth/Sex: 1956-02-25 (66 y.o. F) ?Treating RN: Levora Dredge ?Primary Care Swayze Kozuch: Cathlean Cower Other Clinician: ?Referring Hope Brandenburger: Harrie Jeans ?Treating Kaitrin Seybold/Extender: Kalman Shan ?Weeks in Treatment: 0 ?Foot Assessment Items ?Site Locations ?+ = Sensation present, - = Sensation absent, C = Callus, U = Ulcer ?R = Redness, W = Warmth, M = Maceration, PU = Pre-ulcerative lesion ?F = Fissure, S = Swelling, D = Dryness ?Assessment ?Right: Left: ?Other Deformity: No No ?Prior Foot Ulcer: No No ?Prior Amputation: No No ?Charcot Joint: No No ?Ambulatory Status: Ambulatory With  Help ?Assistance Device: Walker ?Gait: Steady ?Electronic Signature(s) ?Signed: 04/15/2022 4:30:13 PM By: Levora Dredge ?Entered By: Levora Dredge on 04/15/2022 13:14:27 ?Maria [name], [name] ([id number]) ?-------------------------------------------------------------------------------- ?Nutrition Risk Screening Details ?Patient Name: Maria [name], [name] ?Date of Service: 04/15/2022 12:45 PM ?Medical Record Number: [id number] ?Patient Account Number: [id number] ?Date of Birth/Sex: January 17, 1956 (66 y.o. F) ?Treating RN: Levora Dredge ?Primary Care Kayon Dozier: Cathlean Cower Other Clinician: ?Referring Callee Rohrig: Harrie Jeans ?Treating Emmanuell Kantz/Extender: Kalman Shan ?Weeks in Treatment: 0 ?Height (in): 63 ?Weight (lbs): 370 ?Body Mass Index (BMI): 65.5 ?Nutrition Risk Screening Items ?Score Screening ?NUTRITION RISK SCREEN: ?I have an illness or condition that made me change the kind and/or amount of food I eat 0 No ?I eat fewer than two meals per day 0 No ?I eat few fruits and vegetables, or milk products 0 No ?I have three or more drinks of beer, liquor or wine almost every day 0 No ?I have tooth or mouth problems that make it hard for me to eat 0 No ?I don't always have enough money to buy the food I need 0 No ?I eat alone most of the time 0 No ?I take three or more different prescribed or over-the-counter drugs a day 0 No ?Without wanting to, I have lost or gained 10 pounds in the last six months 0 No ?I am not always physically able to shop, cook and/or feed myself 0 No ?Nutrition Protocols ?Good Risk Protocol 0 No interventions needed ?Moderate Risk Protocol ?High Risk Proctocol ?Risk Level: Good Risk ?Score: 0 ?Electronic Signature(s) ?Signed: 04/15/2022 4:30:13 PM By: Levora Dredge ?Entered By: Levora Dredge on 04/15/2022 13:04:59 ?

## 2022-04-15 NOTE — Progress Notes (Signed)
Jaffe, Jamarria A. ([id number]) ?Visit Report for 04/15/2022 ?Chief Complaint Document Details ?Patient Name: [name], [name] ?Date of Service: 04/15/2022 12:45 PM ?Medical Record Number: [id number] ?Patient Account Number: [id number] ?Date of Birth/Sex: 10-15-1956 (67 y.o. F) ?Treating RN: Levora Dredge ?Primary Care Provider: Cathlean Cower Other Clinician: ?Referring Provider: Harrie Jeans ?Treating Provider/Extender: Kalman Shan ?Weeks in Treatment: 0 ?Information Obtained from: Patient ?Chief Complaint ?04/15/2022; lymphedema ?Electronic Signature(s) ?Signed: 04/15/2022 2:08:03 PM By: Kalman Shan DO ?Entered By: Kalman Shan on 04/15/2022 13:58:34 ?[name], [name] ([id number]) ?-------------------------------------------------------------------------------- ?HPI Details ?Patient Name: [name], [name] ?Date of Service: 04/15/2022 12:45 PM ?Medical Record Number: [id number] ?Patient Account Number: [id number] ?Date of Birth/Sex: 05-Aug-1956 (66 y.o. F) ?Treating RN: Levora Dredge ?Primary Care Provider: Cathlean Cower Other Clinician: ?Referring Provider: Harrie Jeans ?Treating Provider/Extender: Kalman Shan ?Weeks in Treatment: 0 ?History of Present Illness ?HPI Description: 04/15/2022 ?Ms. Maria [name] is a 66 year old female with a past medical history of lymphedema, essential hypertension, diastolic congestive heart failure ?and type 2 diabetes that presents the clinic for lymphedema. She states she does not have any open wounds And denies a history of open areas. ?She has had weeping of the legs in the past Currently this is not an issue. She was hoping to be referred to the lymphedema clinic. She does not ?wear compression stockings. She does not have lymphedema pumps. ?Electronic Signature(s) ?Signed: 04/15/2022 2:08:03 PM By: Kalman Shan DO ?Entered By: Kalman Shan on 04/15/2022 14:02:05 ?[name], [name]  ([id number]) ?-------------------------------------------------------------------------------- ?Physical Exam Details ?Patient Name: [name], [name] ?Date of Service: 04/15/2022 12:45 PM ?Medical Record Number: [id number] ?Patient Account Number: [id number] ?Date of Birth/Sex: [birth year] (66 y.o. F) ?Treating RN: Levora Dredge ?Primary Care Provider: Cathlean Cower Other Clinician: ?Referring Provider: Harrie Jeans ?Treating Provider/Extender: Kalman Shan ?Weeks in Treatment: 0 ?Constitutional ?. ?Cardiovascular ?Marland Kitchen ?Psychiatric ?Marland Kitchen ?Notes ?Bilateral lower extremities with lymphedema skin changes. No open wounds or weeping areas. Nonpitting edema to the thigh. ?Electronic Signature(s) ?Signed: 04/15/2022 2:08:03 PM By: Kalman Shan DO ?Entered By: Kalman Shan on 04/15/2022 14:03:45 ?[name], [name] ([id number]) ?-------------------------------------------------------------------------------- ?Physician Orders Details ?Patient Name: [name], [name] ?Date of Service: 04/15/2022 12:45 PM ?Medical Record Number: [id number] ?Patient Account Number: [id number] ?Date of Birth/Sex: Aug 03, 1956 (66 y.o. F) ?Treating RN: Levora Dredge ?Primary Care Provider: Cathlean Cower Other Clinician: ?Referring Provider: Harrie Jeans ?Treating Provider/Extender: Kalman Shan ?Weeks in Treatment: 0 ?Verbal / Phone Orders: No ?Diagnosis Coding ?ICD-10 Coding ?Code Description ?I89.0 Lymphedema, not elsewhere classified ?Electronic Signature(s) ?Signed: 04/15/2022 2:08:03 PM By: Kalman Shan DO ?Entered By: Kalman Shan on 04/15/2022 14:07:21 ?[name], [name] ([id number]) ?-------------------------------------------------------------------------------- ?Problem List Details ?Patient Name: [name], [name] ?Date of Service: 04/15/2022 12:45 PM ?Medical Record Number: [id number] ?Patient Account Number: [id number] ?Date of Birth/Sex: [birth year] (66 y.o. F) ?Treating RN: Levora Dredge ?Primary Care Provider: Cathlean Cower Other  Clinician: ?Referring Provider: Harrie Jeans ?Treating Provider/Extender: Kalman Shan ?Weeks in Treatment: 0 ?Active Problems ?ICD-10 ?Encounter ?Code Description Active Date MDM ?Diagnosis ?I89.0 Lymphedema, not elsewhere classified 04/15/2022 No Yes ?Inactive Problems ?Resolved Problems ?Electronic Signature(s) ?Signed: 04/15/2022 2:08:03 PM By: Kalman Shan DO ?Entered By: Kalman Shan on 04/15/2022 13:58:08 ?[name], [name] ([id number]) ?-------------------------------------------------------------------------------- ?Progress Note Details ?Patient Name: [name], [name] ?Date of Service: 04/15/2022 12:45 PM ?Medical Record Number: [id number] ?Patient Account Number: [id number] ?Date of Birth/Sex: 1956-04-01 (66 y.o. F) ?Treating RN: Levora Dredge ?Primary Care Provider: Cathlean Cower Other Clinician: ?Referring Provider: Harrie Jeans ?Treating Provider/Extender: Kalman Shan ?Weeks in Treatment: 0 ?Subjective ?Chief Complaint ?Information obtained from  Patient ?04/15/2022; lymphedema ?History of Present Illness (HPI) ?04/15/2022 ?Ms. Maria [name] is a 65 year old female with a past medical history of lymphedema, essential hypertension, diastolic congestive heart failure ?and type 2 diabetes that presents the clinic for lymphedema. She states she does not have any open wounds And denies a history of open areas. ?She has had weeping of the legs in the past Currently this is not an issue. She was hoping to be referred to the lymphedema clinic. She does not ?wear compression stockings. She does not have lymphedema pumps. ?Patient History ?Information obtained from Patient. ?Allergies ?amoxicillin (Reaction: nausea and vomiting), Crestor (Reaction: muscle cramps), Vicodin (Reaction: hallucinations), latex (Reaction: rash), ?codeine (Reaction: hallucinations), morphine (Reaction: migraine headaches), adhesive tape ?Social History ?Former smoker - quit 2009, Alcohol Use - Never, Drug Use - No History,  Caffeine Use - Rarely. ?Medical History ?Hematologic/Lymphatic ?Patient has history of Lymphedema ?Respiratory ?Patient has history of Asthma ?Cardiovascular ?Patient has history of Congestive Heart Failure, Hypertension ?Endocrine ?Patient has history of Type II Diabetes ?Patient is treated with Oral Agents. Blood sugar is tested. ?Medical And Surgical History Notes ?Ear/Nose/Mouth/Throat ?raw throat chronic ?Genitourinary ?stage 3a chronic kidney disease ?Musculoskeletal ?arthritis ?Review of Systems (ROS) ?Constitutional Symptoms (General Health) ?Denies complaints or symptoms of Fatigue, Fever, Chills, Marked Weight Change. ?Eyes ?Complains or has symptoms of Dry Eyes, Glasses / Contacts. ?Respiratory ?Complains or has symptoms of Shortness of Breath. ?Cardiovascular ?Complains or has symptoms of LE edema. ?Gastrointestinal ?gerd ?Immunological ?Denies complaints or symptoms of Hives, Itching. ?Integumentary (Skin) ?Complains or has symptoms of Swelling, cellulitis ?Musculoskeletal ?Complains or has symptoms of Muscle Pain. ?Neurologic ?Denies complaints or symptoms of Numbness/parasthesias, Focal/Weakness, tingling in fingers and feet ?Psychiatric ?Complains or has symptoms of Anxiety, depression ?Olivencia, Yamilka A. ([id number]) ?Objective ?Constitutional ?Vitals Time Taken: 12:50 PM, Height: 63 in, Source: Stated, Weight: 370 lbs, Source: Stated, BMI: 65.5, Temperature: 97.6 ??F, Pulse: 57 bpm, ?Respiratory Rate: 18 breaths/min, Blood Pressure: 120/62 mmHg. ?General Notes: Bilateral lower extremities with lymphedema skin changes. No open wounds or weeping areas. Nonpitting edema to the thigh. ?Assessment ?Active Problems ?ICD-10 ?Lymphedema, not elsewhere classified ?Patient has a history of lymphedema. She has no open wounds today. I recommended compression stockings daily. At this time she does not ?want [place] to order them. We did give her leg measurements and information to order these on her own if she would  like. She also expressed ?interest in being referred to lymphedema clinic. We will place this order today. ?She may follow-up as needed. ?48 minutes was spent on the encounter including face-to-face, EMR review and coordination of care ?Plan ?1. Follow-up as needed ?2. Lymphedema clinic r

## 2022-04-19 DIAGNOSIS — E1159 Type 2 diabetes mellitus with other circulatory complications: Secondary | ICD-10-CM | POA: Diagnosis not present

## 2022-04-19 DIAGNOSIS — I509 Heart failure, unspecified: Secondary | ICD-10-CM | POA: Diagnosis not present

## 2022-04-20 NOTE — Progress Notes (Signed)
?  ?Cardiology Office Note ? ? ?Date:  04/21/2022  ? ?ID:  Maria [name], DOB 11-03-1956, MRN [id number] ? ?PCP:  Biagio Borg, MD  ?Cardiologist:   Minus Breeding, MD ? ?Chief Complaint  ?Patient presents with  ? Shortness of Breath  ? ?  ?History of Present Illness: ?Maria [name] is a 66 y.o. female who presents for follow up of acute on chronic diastolic dysfunction. She was in the hospital in late April with abdominal pain.  We saw her for acute on chronic diastolic dysfunction.  She had some mild acute on chronic renal insufficiency.   We stopped her metolazone and switched Lasix to torsemide.  She was in the ED on 5/12 because she thought that she was volume overloaded and she had gained some weight.  However, BNP and CXR and exam did not suggest acute volume overload.   Her last echocardiogram 03/08/21 showed EF 60-65%, G1DD, no RWMA, elevated LVEDP, and no significant valvular abnormalities. Does not appear to have had prior ischemic testing though does have evidence of prior coronary artery calcifications (specifically LAD) noted on prior CTA Chest in 2020.  ? ?Since I last saw her her weight seems to be up.  Her lowest weight is around 356 which she has not been out for a while but she is higher than previous.  She feels like she is carrying more weight in her legs.  She feels like she is having more shortness of breath with ambulation.  She gets around slowly with a walker.  She does say that her saturations are in the 92% range or so when she feels short of breath and she does not see it fall below 90.  She is not having any new PND or orthopnea.  She did have a fall hitting her head but did not want to go to the emergency room.  She has a little shoulder pain related to this.  She is not describing any new chest pressure, neck or arm discomfort.  She is not having any new palpitations.  She is not having any presyncope or syncope.  Her heart rates been running a little low on the  beta-blocker. ? ? ?Past Medical History:  ?Diagnosis Date  ? Acute lymphadenitis 2011  ? ALLERGIC RHINITIS 08/10/2007  ? ANXIETY 12/06/2007  ? Atherosclerotic peripheral vascular disease ([place]) 06/13/2013  ? Aorta on CT June 2014  ? Cellulitis 2011  ? Cervical disc disease 03/09/2012  ? CHF (congestive heart failure) ([place])   ? Chronic pain 03/09/2012  ? DEPRESSION 12/06/2007  ? Diabetes ([place])   ? DIVERTICULOSIS, [place] 12/06/2007  ? GERD 12/06/2007  ? Hepatitis   ? age 75 hepatitis A  ? HLD (hyperlipidemia) 05/24/2019  ? HYPERTENSION 12/06/2007  ? Lumbar disc disease 03/09/2012  ? Mesenteric adenitis   ? MRSA 2006  ? 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 THORACIC/LUMBOSACRAL NEURITIS/RADICULITIS UNSPEC 12/28/2008  ? ? ?Past Surgical History:  ?Procedure Laterality Date  ? ABDOMINAL HYSTERECTOMY  1999  ? 1 ovary left  ? bloo clot removed from neck   may 25th , june 2. 2010  ? CERVICAL DISC SURGERY  may 24th 2010  ? COLONOSCOPY WITH PROPOFOL N/A 07/10/2016  ? Procedure: COLONOSCOPY WITH PROPOFOL;  Surgeon: Manus Gunning, MD;  Location: [facility];  Service: Gastroenterology;  Laterality: N/A;  ? COLONOSCOPY WITH PROPOFOL N/A 09/26/2019  ? Procedure: COLONOSCOPY WITH PROPOFOL;  Surgeon: Yetta Flock, MD;  Location: [facility];  Service: Gastroenterology;  Laterality: N/A;  ? ESOPHAGOGASTRODUODENOSCOPY (EGD) WITH PROPOFOL N/A 07/10/2016  ? Procedure: ESOPHAGOGASTRODUODENOSCOPY (EGD) WITH PROPOFOL;  Surgeon: Manus Gunning, MD;  Location: [facility];  Service: Gastroenterology;  Laterality: N/A;  ? POLYPECTOMY  09/26/2019  ? Procedure: POLYPECTOMY;  Surgeon: Yetta Flock, MD;  Location: [facility];  Service: Gastroenterology;;  ? s/p ovary cyst    ? s/p right knee arthroscopy    ? Dr. Mardelle Matte ortho  ? ? ? ?Current Outpatient Medications  ?Medication Sig Dispense Refill  ? Accu-Chek Softclix Lancets lancets Use as directed up to 4 times daily (Patient taking differently: 1 each by Other route  in the morning, at noon, in the evening, and at bedtime.) 100 each 5  ? acetaminophen (TYLENOL) 325 MG tablet Take 650 mg by mouth every 6 (six) hours as needed for headache or fever (pain).    ? albuterol (PROAIR HFA) 108 (90 Base) MCG/ACT inhaler Inhale 2 puffs into the lungs every 6 (six) hours as needed for wheezing or shortness of breath. 1 each 0  ? Blood Glucose Monitoring Suppl (ACCU-CHEK GUIDE) w/Device KIT Use as directed (Patient taking differently: 1 each by Other route as directed.) 1 kit 0  ? cholecalciferol (VITAMIN D3) 25 MCG (1000 UNIT) tablet Take 2,000 Units by mouth daily.    ? citalopram (CELEXA) 20 MG tablet Take 1 tablet (20 mg total) by mouth daily. 90 tablet 3  ? clonazePAM (KLONOPIN) 1 MG tablet Take 1 tablet (1 mg total) by mouth 2 (two) times daily as needed for anxiety. 60 tablet 5  ? cyclobenzaprine (FLEXERIL) 10 MG tablet TAKE 1 TABLET BY MOUTH 2 TIMES A DAY AS NEEDED FOR MUSCLE SPASMS (Patient taking differently: Take 10 mg by mouth daily as needed. TAKE 1 TABLET BY MOUTH 2 TIMES A DAY AS NEEDED FOR MUSCLE SPASMS) 180 tablet 1  ? Fexofenadine HCl (ALLEGRA PO) Take 180 mg by mouth daily as needed (allergies).    ? glucose blood (ACCU-CHEK GUIDE) test strip Use as instructed up to 4 times daily 100 each 12  ? Magnesium 400 MG TABS Take 400 mg by mouth in the morning and at bedtime. 60 tablet 0  ? Metamucil Fiber CHEW Chew 3 tablets by mouth at bedtime.    ? metolazone (ZAROXOLYN) 5 MG tablet Take 5 mg by mouth 2 (two) times a week. Per nephrologist    ? metoprolol tartrate (LOPRESSOR) 25 MG tablet Take 25 mg by mouth 2 (two) times daily.    ? NARCAN 4 MG/0.1ML LIQD nasal spray kit Place 0.4 mg into the nose daily as needed (opioid overdose).    ? ondansetron (ZOFRAN ODT) 4 MG disintegrating tablet Take 1 tablet (4 mg total) by mouth every 4 (four) hours as needed for nausea or vomiting. 20 tablet 0  ? Oxycodone HCl 10 MG TABS Take 1 tablet (10 mg total) 4 (four) times daily by mouth.  Per [facility] (Patient taking differently: Take 10 mg by mouth every 4 (four) hours as needed (pain). Per [facility]) 30 tablet 0  ? pantoprazole (PROTONIX) 40 MG tablet TAKE ONE TABLET BY MOUTH DAILY 90 tablet 2  ? Polyethyl Glycol-Propyl Glycol 0.4-0.3 % SOLN Apply 1 drop to eye daily as needed (dry eyes).    ? polyethylene glycol powder (GLYCOLAX/MIRALAX) 17 GM/SCOOP powder Take 17 g by mouth daily as needed for mild constipation. 510 g 0  ? pramipexole (MIRAPEX) 0.5 MG tablet Take 1 tablet (0.5 mg total) by mouth  3 (three) times daily. 90 tablet 5  ? Semaglutide, 1 MG/DOSE, 4 MG/3ML SOPN Inject 1 mg as directed once a week. 3 mL 11  ? torsemide (DEMADEX) 20 MG tablet Take 2 tablets in the morning and 1 tablet in the afternoon. (Patient taking differently: 55m twice daily - per nephrologist) 180 tablet 1  ? potassium chloride SA (KLOR-CON) 20 MEQ tablet Take 1 tablet (20 mEq total) by mouth 2 (two) times daily. 180 tablet 3  ? ?No current facility-administered medications for this visit.  ? ? ?Allergies:   Amoxicillin-pot clavulanate, Adhesive [tape], Codeine, Crestor [rosuvastatin calcium], Morphine and related, Vicodin [hydrocodone-acetaminophen], and Latex  ? ? ?ROS:  Please see the history of present illness.   Otherwise, review of systems are positive for none.   All other systems are reviewed and negative.  ? ? ?PHYSICAL EXAM: ?VS:  BP (!) 122/53   Pulse (!) 58   Ht '5\' 3"'  (1.6 m)   Wt (!) 371 lb 3.2 oz (168.4 kg)   BMI 65.76 kg/m?  , BMI Body mass index is 65.76 kg/m?. ?GEN:  No distress ?NECK:  No jugular venous distention at 90 degrees, waveform within normal limits, carotid upstroke brisk and symmetric, no bruits, no thyromegaly ?LYMPHATICS:  No cervical adenopathy ?LUNGS:  Clear to auscultation bilaterally ?BACK:  No CVA tenderness ?CHEST:  Unremarkable ?HEART:  S1 and S2 within normal limits, no S3, no S4, no clicks, no rubs, 2 out of 6 systolic murmur heard best at the left  upper sternal border murmurs ?ABD:  Positive bowel sounds normal in frequency in pitch, no bruits, no rebound, no guarding, unable to assess midline mass or bruit with the patient seated. ?EXT:  2 plus pulses throughout, moder

## 2022-04-21 ENCOUNTER — Ambulatory Visit (INDEPENDENT_AMBULATORY_CARE_PROVIDER_SITE_OTHER): Payer: Medicare Other | Admitting: Cardiology

## 2022-04-21 ENCOUNTER — Encounter: Payer: Self-pay | Admitting: Cardiology

## 2022-04-21 VITALS — BP 122/53 | HR 58 | Ht 63.0 in | Wt 371.2 lb

## 2022-04-21 DIAGNOSIS — E118 Type 2 diabetes mellitus with unspecified complications: Secondary | ICD-10-CM | POA: Diagnosis not present

## 2022-04-21 DIAGNOSIS — I5032 Chronic diastolic (congestive) heart failure: Secondary | ICD-10-CM

## 2022-04-21 DIAGNOSIS — I10 Essential (primary) hypertension: Secondary | ICD-10-CM | POA: Diagnosis not present

## 2022-04-21 NOTE — Patient Instructions (Signed)
Medication Instructions:  ? ?-Take metolazone (zaroxolyn) '5mg'$  for 5 straight days. ? ?-On these 5 days take an 1 additional potassium chloride 20 meq. ? ? ?*If you need a refill on your cardiac medications before your next appointment, please call your pharmacy* ? ? ?Lab Work: ?Your physician recommends that you have labs drawn today: BMET ? ?If you have labs (blood work) drawn today and your tests are completely normal, you will receive your results only by: ?MyChart Message (if you have MyChart) OR ?A paper copy in the mail ?If you have any lab test that is abnormal or we need to change your treatment, we will call you to review the results. ? ? ?Follow-Up: ?At [facility], you and your health needs are our priority.  As part of our continuing mission to provide you with exceptional heart care, we have created designated Provider Care Teams.  These Care Teams include your primary Cardiologist (physician) and Advanced Practice Providers (APPs -  Physician Assistants and Nurse Practitioners) who all work together to provide you with the care you need, when you need it. ? ?We recommend signing up for the patient portal called "MyChart".  Sign up information is provided on this After Visit Summary.  MyChart is used to connect with patients for Virtual Visits (Telemedicine).  Patients are able to view lab/test results, encounter notes, upcoming appointments, etc.  Non-urgent messages can be sent to your provider as well.   ?To learn more about what you can do with MyChart, go to [email or web address].   ? ?Your next appointment:   ?3 month(s) ? ?The format for your next appointment:   ?In Person ? ?Provider:   ?Coletta Memos, FNP, Fabian Sharp, PA-C, Sande Rives, PA-C, Caron Presume, PA-C, Jory Sims, DNP, ANP, Almyra Deforest, PA-C, or Diona Browner, NP     ?

## 2022-04-22 ENCOUNTER — Telehealth: Payer: Self-pay | Admitting: Licensed Clinical Social Worker

## 2022-04-22 LAB — BASIC METABOLIC PANEL
BUN/Creatinine Ratio: 15 (ref 12–28)
BUN: 19 mg/dL (ref 8–27)
CO2: 39 mmol/L — ABNORMAL HIGH (ref 20–29)
Calcium: 9.3 mg/dL (ref 8.7–10.3)
Chloride: 83 mmol/L — ABNORMAL LOW (ref 96–106)
Creatinine, Ser: 1.29 mg/dL — ABNORMAL HIGH (ref 0.57–1.00)
Glucose: 112 mg/dL — ABNORMAL HIGH (ref 70–99)
Potassium: 3.1 mmol/L — ABNORMAL LOW (ref 3.5–5.2)
Sodium: 138 mmol/L (ref 134–144)
eGFR: 46 mL/min/{1.73_m2} — ABNORMAL LOW (ref >59)

## 2022-04-22 NOTE — Progress Notes (Signed)
?Heart and Vascular Care Navigation ? ?04/22/2022 ? ?Maria [name] ?[birth year] ?[id number] ? ?Reason for Referral:  ?Transportation and [facility] ?Engaged with patient by telephone for follow up visit for Heart and Vascular Care Coordination. ?                                                                                                  ?Assessment:    ?LCSW reached pt via telephone at [phone]. Pt familiar with this writer due to ongoing transportation challenges. Re- introduced self, role, reason for call. Pt confirmed home address, current PCP, and her emergency contacts (daughter moved to [place], sister lives in [place]). Pt shares she continues to have ongoing challenges with mobility and transportation. She utilizes her [facility] Medicare rides but those are limited throughout the year and with ongoing medical visits and labs etc she uses them quickly. Inquired if pt had received resources previously sent, she is unsure. I shared that she may be eligible for [facility] [employer] to help get to daily tasks of living such as the store, appointments etc. Pt interested in this, she requests Dr. Percival Spanish complete the provider portion. I have mailed her a copy and we will see how best we can get her portion when she has it completed.  ? ?We then discussed pt challenges even once she has gotten to appointments with mobility etc. I shared a bit about [facility], and that she may qualify for in home visits through their program/management by their team. She would utilize their primary care providers and still make speciality visits as needed. This is a program designed to keep her out of the hospital as able by managing things at home. Pt interested, she understands how it works and that this referral does not commit her.  ? ? Pt encouraged to call me with any additional questions/concerns.  ?HRT/VAS Care Coordination   ? ? Patients Home Cardiology Office [facility]  ? Outpatient Care Team  Social Worker  ? Social Worker Name: Margarito [facility] [phone]  ? Living arrangements for the past 2 months Apartment  ? Lives with: Self  ? Patient Current Insurance Coverage Managed Medicare  ? Patient Has Concern With Paying Medical Bills No  ? Does Patient Have Prescription Coverage? Yes  ? Home Assistive Devices/Equipment Scales; Cane (specify quad or straight); Shower chair with back; Walker (specify type); CBG Meter; Blood pressure cuff; Eyeglasses; Bedside commode/3-in-1  ? DME Agency NA  ? [place]  ? Current home services DME  cane, rollator  ? ?  ? ? ?Social History:                                                                             ?SDOH Screenings  ? ?Alcohol Screen: Low Risk   ?  Last Alcohol Screening Score (AUDIT): 0  ?Depression (PHQ2-9): Medium Risk  ? PHQ-2 Score: 9  ?Financial Resource Strain: Medium Risk  ? Difficulty of Paying Living Expenses: Somewhat hard  ?Food Insecurity: No Food Insecurity  ? Worried About Charity fundraiser in the Last Year: Never true  ? Ran Out of Food in the Last Year: Never true  ?Housing: Low Risk   ? Last Housing Risk Score: 0  ?Physical Activity: Inactive  ? Days of Exercise per Week: 0 days  ? Minutes of Exercise per Session: 0 min  ?Social Connections: Socially Isolated  ? Frequency of Communication with Friends and Family: More than three times a week  ? Frequency of Social Gatherings with Friends and Family: More than three times a week  ? Attends Religious Services: Never  ? Active Member of Clubs or Organizations: No  ? Attends Archivist Meetings: Never  ? Marital Status: Never married  ?Stress: No Stress Concern Present  ? Feeling of Stress : Not at all  ?Tobacco Use: Medium Risk  ? Smoking Tobacco Use: Former  ? Smokeless Tobacco Use: Never  ? Passive Exposure: Not on file  ?Transportation Needs: Unmet Transportation Needs  ? Lack of Transportation (Medical): No  ? Lack of Transportation  (Non-Medical): Yes  ? ? ?SDOH Interventions: ?Transportation:   Transportation Interventions: [employer] Pension scheme manager), Payor Benefit (mailed application)  ? ? ? ?Other Care Navigation Interventions:    ? ?Patient Referred to: [facility]  ? ?Follow-up plan:   ?Pt mailed cover letter and part A of [employer] [facility] [employer] application for transportation. Pt also mailed flyer about [facility] and her referral was securely faxed with her permission to their team [phone]). I will f/u next week to ensure receipt of application. Once pt has completed her portion I will provide part B to Dr. Rosezella [place] team to complete.                      ? ? ? ?

## 2022-04-23 ENCOUNTER — Telehealth: Payer: Self-pay | Admitting: *Deleted

## 2022-04-23 DIAGNOSIS — E876 Hypokalemia: Secondary | ICD-10-CM

## 2022-04-23 MED ORDER — POTASSIUM CHLORIDE CRYS ER 20 MEQ PO TBCR: EXTENDED_RELEASE_TABLET | ORAL | 3 refills | Status: DC

## 2022-04-23 NOTE — Addendum Note (Signed)
Addended by: Gean Birchwood on: 04/23/2022 04:31 PM ? ? Modules accepted: Orders ? ?

## 2022-04-23 NOTE — Telephone Encounter (Signed)
Spoke with pt, aware of dr hochrein's recommendations.  ?New script sent to the pharmacy  ?Lab orders mailed to the pt ? ? ? ?

## 2022-04-23 NOTE — Telephone Encounter (Signed)
-----   Message from Minus Breeding, MD sent at 04/23/2022  7:48 AM EDT ----- ?I would like for her to take another 40 meq of potassium for the next two days in addition to what I had instructed in the office.  She needs to have a BMET again on Monday.  Thanks.   ?

## 2022-04-28 ENCOUNTER — Telehealth: Payer: Self-pay | Admitting: Licensed Clinical Social Worker

## 2022-04-28 NOTE — Telephone Encounter (Signed)
LCSW reached out to pt via telephone today.  ?No answer at [phone], voicemail left requesting call back. Pt called back to this writer, let me know that she has been in contact with [facility] but didn't get fully enrolled, she plans to call them back by lunchtime today. She has not yet received her [facility] papers but will try and have her neighbor get her mail for her. I have given Belinda Block, RN, the paperwork for Dr. Percival Spanish to complete. I will f/u again with pt by the end of the week.  ? ?Westley Hummer, MSW, LCSW ?Clinical Social Worker II ?[place] Heart/Vascular Care Navigation  ?[phone]- work cell phone (preferred) ?[phone]- desk phone ? ?

## 2022-04-29 DIAGNOSIS — Z79891 Long term (current) use of opiate analgesic: Secondary | ICD-10-CM | POA: Diagnosis not present

## 2022-04-29 DIAGNOSIS — I5033 Acute on chronic diastolic (congestive) heart failure: Secondary | ICD-10-CM | POA: Diagnosis not present

## 2022-04-29 DIAGNOSIS — E119 Type 2 diabetes mellitus without complications: Secondary | ICD-10-CM | POA: Diagnosis not present

## 2022-04-30 ENCOUNTER — Encounter (HOSPITAL_COMMUNITY): Payer: Self-pay | Admitting: Emergency Medicine

## 2022-04-30 ENCOUNTER — Emergency Department (HOSPITAL_COMMUNITY): Payer: Medicare Other

## 2022-04-30 ENCOUNTER — Other Ambulatory Visit: Payer: Self-pay

## 2022-04-30 ENCOUNTER — Emergency Department (HOSPITAL_COMMUNITY)
Admission: EM | Admit: 2022-04-30 | Discharge: 2022-05-01 | Disposition: A | Discharge status: Home / Self Care | Payer: Medicare Other | Attending: Emergency Medicine | Admitting: Emergency Medicine

## 2022-04-30 ENCOUNTER — Telehealth: Payer: Self-pay | Admitting: Cardiology

## 2022-04-30 DIAGNOSIS — I509 Heart failure, unspecified: Secondary | ICD-10-CM | POA: Insufficient documentation

## 2022-04-30 DIAGNOSIS — R6889 Other general symptoms and signs: Secondary | ICD-10-CM | POA: Diagnosis not present

## 2022-04-30 DIAGNOSIS — R600 Localized edema: Secondary | ICD-10-CM | POA: Insufficient documentation

## 2022-04-30 DIAGNOSIS — Z79891 Long term (current) use of opiate analgesic: Secondary | ICD-10-CM | POA: Diagnosis not present

## 2022-04-30 DIAGNOSIS — Z794 Long term (current) use of insulin: Secondary | ICD-10-CM | POA: Diagnosis not present

## 2022-04-30 DIAGNOSIS — N189 Chronic kidney disease, unspecified: Secondary | ICD-10-CM | POA: Diagnosis not present

## 2022-04-30 DIAGNOSIS — R0602 Shortness of breath: Secondary | ICD-10-CM | POA: Diagnosis not present

## 2022-04-30 DIAGNOSIS — L03115 Cellulitis of right lower limb: Secondary | ICD-10-CM | POA: Diagnosis not present

## 2022-04-30 DIAGNOSIS — M79661 Pain in right lower leg: Secondary | ICD-10-CM | POA: Diagnosis not present

## 2022-04-30 DIAGNOSIS — M1711 Unilateral primary osteoarthritis, right knee: Secondary | ICD-10-CM | POA: Diagnosis not present

## 2022-04-30 DIAGNOSIS — M7989 Other specified soft tissue disorders: Secondary | ICD-10-CM | POA: Diagnosis present

## 2022-04-30 DIAGNOSIS — M542 Cervicalgia: Secondary | ICD-10-CM | POA: Diagnosis not present

## 2022-04-30 DIAGNOSIS — I890 Lymphedema, not elsewhere classified: Secondary | ICD-10-CM | POA: Insufficient documentation

## 2022-04-30 DIAGNOSIS — Z91040 Latex allergy status: Secondary | ICD-10-CM | POA: Diagnosis not present

## 2022-04-30 DIAGNOSIS — E876 Hypokalemia: Secondary | ICD-10-CM | POA: Insufficient documentation

## 2022-04-30 DIAGNOSIS — J3089 Other allergic rhinitis: Secondary | ICD-10-CM | POA: Diagnosis not present

## 2022-04-30 DIAGNOSIS — R609 Edema, unspecified: Secondary | ICD-10-CM | POA: Diagnosis not present

## 2022-04-30 DIAGNOSIS — I5033 Acute on chronic diastolic (congestive) heart failure: Secondary | ICD-10-CM | POA: Diagnosis not present

## 2022-04-30 DIAGNOSIS — E119 Type 2 diabetes mellitus without complications: Secondary | ICD-10-CM | POA: Insufficient documentation

## 2022-04-30 DIAGNOSIS — L309 Dermatitis, unspecified: Secondary | ICD-10-CM | POA: Insufficient documentation

## 2022-04-30 DIAGNOSIS — R7889 Finding of other specified substances, not normally found in blood: Secondary | ICD-10-CM | POA: Diagnosis not present

## 2022-04-30 DIAGNOSIS — R29898 Other symptoms and signs involving the musculoskeletal system: Secondary | ICD-10-CM | POA: Diagnosis not present

## 2022-04-30 DIAGNOSIS — Z743 Need for continuous supervision: Secondary | ICD-10-CM | POA: Diagnosis not present

## 2022-04-30 DIAGNOSIS — M25552 Pain in left hip: Secondary | ICD-10-CM | POA: Diagnosis not present

## 2022-04-30 DIAGNOSIS — M25562 Pain in left knee: Secondary | ICD-10-CM | POA: Diagnosis not present

## 2022-04-30 DIAGNOSIS — M25561 Pain in right knee: Secondary | ICD-10-CM | POA: Diagnosis not present

## 2022-04-30 DIAGNOSIS — M25512 Pain in left shoulder: Secondary | ICD-10-CM | POA: Diagnosis not present

## 2022-04-30 DIAGNOSIS — G894 Chronic pain syndrome: Secondary | ICD-10-CM | POA: Diagnosis not present

## 2022-04-30 LAB — CBC WITH DIFFERENTIAL/PLATELET
Abs Immature Granulocytes: 0.05 10*3/uL (ref 0.00–0.07)
Basophils Absolute: 0 10*3/uL (ref 0.0–0.1)
Basophils Relative: 0 %
Eosinophils Absolute: 0.2 10*3/uL (ref 0.0–0.5)
Eosinophils Relative: 1 %
HCT: 36.1 % (ref 36.0–46.0)
Hemoglobin: 12.1 g/dL (ref 12.0–15.0)
Immature Granulocytes: 0 %
Lymphocytes Relative: 31 %
Lymphs Abs: 3.5 10*3/uL (ref 0.7–4.0)
MCH: 29.5 pg (ref 26.0–34.0)
MCHC: 33.5 g/dL (ref 30.0–36.0)
MCV: 88 fL (ref 80.0–100.0)
Monocytes Absolute: 0.9 10*3/uL (ref 0.1–1.0)
Monocytes Relative: 8 %
Neutro Abs: 6.7 10*3/uL (ref 1.7–7.7)
Neutrophils Relative %: 60 %
Platelets: 262 10*3/uL (ref 150–400)
RBC: 4.1 MIL/uL (ref 3.87–5.11)
RDW: 13.9 % (ref 11.5–15.5)
WBC: 11.4 10*3/uL — ABNORMAL HIGH (ref 4.0–10.5)
nRBC: 0 % (ref 0.0–0.2)

## 2022-04-30 LAB — BASIC METABOLIC PANEL
Anion gap: 9 (ref 5–15)
BUN: 12 mg/dL (ref 8–23)
CO2: 38 mmol/L — ABNORMAL HIGH (ref 22–32)
Calcium: 9.2 mg/dL (ref 8.9–10.3)
Chloride: 87 mmol/L — ABNORMAL LOW (ref 98–111)
Creatinine, Ser: 1.09 mg/dL — ABNORMAL HIGH (ref 0.44–1.00)
GFR, Estimated: 57 mL/min — ABNORMAL LOW (ref >60)
Glucose, Bld: 99 mg/dL (ref 70–99)
Potassium: 2.7 mmol/L — CL (ref 3.5–5.1)
Sodium: 134 mmol/L — ABNORMAL LOW (ref 135–145)

## 2022-04-30 LAB — BRAIN NATRIURETIC PEPTIDE: B Natriuretic Peptide: 99 pg/mL (ref 0.0–100.0)

## 2022-04-30 MED ORDER — ONDANSETRON HCL 4 MG/2ML IJ SOLN
4.0000 mg | Freq: Once | INTRAMUSCULAR | Status: AC
  Administered 2022-05-01: 4 mg via INTRAVENOUS
  Filled 2022-04-30: qty 2

## 2022-04-30 MED ORDER — POTASSIUM CHLORIDE CRYS ER 20 MEQ PO TBCR
40.0000 meq | EXTENDED_RELEASE_TABLET | Freq: Once | ORAL | Status: AC
Start: 2022-04-30 — End: 2022-04-30
  Administered 2022-04-30: 40 meq via ORAL
  Filled 2022-04-30: qty 2

## 2022-04-30 MED ORDER — FENTANYL CITRATE PF 50 MCG/ML IJ SOSY
50.0000 ug | PREFILLED_SYRINGE | Freq: Once | INTRAMUSCULAR | Status: AC
  Administered 2022-05-01: 50 ug via INTRAVENOUS
  Filled 2022-04-30: qty 1

## 2022-04-30 MED ORDER — MAGNESIUM SULFATE 2 GM/50ML IV SOLN
2.0000 g | INTRAVENOUS | Status: AC
  Administered 2022-04-30: 2 g via INTRAVENOUS
  Filled 2022-04-30: qty 50

## 2022-04-30 MED ORDER — POTASSIUM CHLORIDE 10 MEQ/100ML IV SOLN
10.0000 meq | INTRAVENOUS | Status: AC
  Administered 2022-04-30 – 2022-05-01 (×2): 10 meq via INTRAVENOUS
  Filled 2022-04-30 (×2): qty 100

## 2022-04-30 NOTE — Telephone Encounter (Signed)
Spoke with Heather from [place] and gave her the folloing recommendation from Dr. Percival Spanish..."I unfortunately cannot treat this as an out patient with her constellation of problems.  I think she needs to go to the emergency room.  If she refuses this she at least needs to take an extra 80 mEq of potassium today and an extra 40 tomorrow and get a basic metabolic profile on Friday.  However, given the weight gain and the difficulty with diuresis I prefer she would be hospitalized." Heather vocalized understanding and said she would call the patient to let her know.  ?

## 2022-04-30 NOTE — Telephone Encounter (Signed)
Heather from [facility] reported patient gained 5 pounds over night and that labs were faxed to [place]. Lab results and weight gain provided to Dr. Percival Spanish. ?

## 2022-04-30 NOTE — ED Notes (Signed)
Date and time results received: 04/30/22 2027  ?(use smartphrase ".now" to insert current time) ? ?Test: K+ ?Critical Value: 2.7 ? ?Name of Provider Notified: A. Harris, PA-C ? ? ?

## 2022-04-30 NOTE — Assessment & Plan Note (Signed)
Due to aggressive diuresis. EDP has ordered both IV and PO replacement. Pt has po kcl at home she takes on a regular basis. ?

## 2022-04-30 NOTE — Assessment & Plan Note (Addendum)
Pt with non-pitting edema of both legs. See picture. Pt's increased dose of diuretics not helping.  No surprise as aggressive diuresis does not help lymphedema.  Pt needs compression of her lymphedema. Pt states "Extra Large" compression stockings only go up to her ankles. Pt has been referred to lymphedema clinic by wound care clinic. Pt awaiting to hear back for appointment. I placed ACE bandage compression wraps on her in the interim to help.  Pt states she has home health RN coming on to her house 2-3 days per week.  She can get HHRN to re-wrap her legs. ? ?Discussed that pt should be back to her routine diuretic regimen. Pt to stop taking daily metolazone. Pt does not require inpatient hospitalization. Discussed with EDP. Pt will be discharge to home. ? ? ? ? ?

## 2022-04-30 NOTE — ED Notes (Signed)
Pt was able to transfer to the bedside commode with little help.  ?

## 2022-04-30 NOTE — Consult Note (Signed)
?Hospitalist consultation ?History and Physical  ? ? ?Maria [name] EVO:[id number] DOB: 02-19-1956 DOA: 04/30/2022 ? ?DOS: the patient was seen and examined on 04/30/2022 ? ?PCP: Biagio Borg, MD  ? ?Patient coming from: Home ? ?I have personally briefly reviewed patient's old medical records in [place] ? ?CC: lower leg swelling not improving on diuretics ?HPI: ?66 year old Caucasian female history of type 2 diabetes, hypertension, morbid obesity, chronic diastolic heart failure presents to the ER today with lower leg swelling that is not improving on diuretics.  Patient diagnosed with lymphedema.  Was seen by wound care clinic.  She was not agreeable to compression stockings.  She was referred to lymphedema clinic.  Patient's been on aggressive diuretic regimen over the last week including daily metolazone, 40 mg of Demadex in the morning and 20 mg in the evening.  Patient states that she has been urinating well.  She has not noticed any change in the amount of leg swelling that she has.  She was referred to the ER by her cardiology office. ? ?Patient states that her weight was 376 pounds today and it was 378 pounds yesterday.  She wonders why despite having decreasing weight, she has not seen any improvement in the amount of leg swelling she has. ? ?Patient denies any shortness of breath,Chest pain, Diarrhea.  No nausea, vomiting. ? ?[facility] hospitalist contacted for consultation.  ? ?ED Course: hypokalemic. BNP normal. CXR negative for pulmonary edema. ? ?Review of Systems:  ?Review of Systems  ?Constitutional:  Negative for chills and fever.  ?HENT: Negative.    ?Eyes: Negative.   ?Respiratory:  Negative for shortness of breath.   ?Cardiovascular:  Positive for leg swelling. Negative for chest pain and palpitations.  ?Gastrointestinal: Negative.   ?Genitourinary: Negative.   ?Musculoskeletal: Negative.   ?Skin:   ?     LE swelling not improved with diuresis.  ?Neurological: Negative.    ?Endo/Heme/Allergies: Negative.   ?Psychiatric/Behavioral: Negative.    ?All other systems reviewed and are negative. ? ?Past Medical History:  ?Diagnosis Date  ? Acute lymphadenitis 2011  ? ALLERGIC RHINITIS 08/10/2007  ? ANXIETY 12/06/2007  ? Atherosclerotic peripheral vascular disease ([place]) 06/13/2013  ? Aorta on CT June 2014  ? Cellulitis 2011  ? Cervical disc disease 03/09/2012  ? CHF (congestive heart failure) ([place])   ? Chronic pain 03/09/2012  ? DEPRESSION 12/06/2007  ? Diabetes ([place])   ? DIVERTICULOSIS, [place] 12/06/2007  ? GERD 12/06/2007  ? Hepatitis   ? age 54 hepatitis A  ? HLD (hyperlipidemia) 05/24/2019  ? HYPERTENSION 12/06/2007  ? Lumbar disc disease 03/09/2012  ? Mesenteric adenitis   ? MRSA 2006  ? 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 THORACIC/LUMBOSACRAL NEURITIS/RADICULITIS UNSPEC 12/28/2008  ? ? ?Past Surgical History:  ?Procedure Laterality Date  ? ABDOMINAL HYSTERECTOMY  1999  ? 1 ovary left  ? bloo clot removed from neck   may 25th , june 2. 2010  ? CERVICAL DISC SURGERY  may 24th 2010  ? COLONOSCOPY WITH PROPOFOL N/A 07/10/2016  ? Procedure: COLONOSCOPY WITH PROPOFOL;  Surgeon: Manus Gunning, MD;  Location: [facility];  Service: Gastroenterology;  Laterality: N/A;  ? COLONOSCOPY WITH PROPOFOL N/A 09/26/2019  ? Procedure: COLONOSCOPY WITH PROPOFOL;  Surgeon: Yetta Flock, MD;  Location: [facility];  Service: Gastroenterology;  Laterality: N/A;  ? ESOPHAGOGASTRODUODENOSCOPY (EGD) WITH PROPOFOL N/A 07/10/2016  ? Procedure: ESOPHAGOGASTRODUODENOSCOPY (EGD) WITH PROPOFOL;  Surgeon: Manus Gunning, MD;  Location: [facility];  Service:  Gastroenterology;  Laterality: N/A;  ? POLYPECTOMY  09/26/2019  ? Procedure: POLYPECTOMY;  Surgeon: Yetta Flock, MD;  Location: [facility];  Service: Gastroenterology;;  ? s/p ovary cyst    ? s/p right knee arthroscopy    ? Dr. Mardelle Matte ortho  ? ? ? reports that she quit smoking about 13 years ago. Her smoking use included cigarettes.  She has never used smokeless tobacco. She reports that she does not drink alcohol and does not use drugs. ? ?Allergies  ?Allergen Reactions  ? Amoxicillin-Pot Clavulanate Nausea And Vomiting  ?  Projectile vomiting ?Did it involve swelling of the face/tongue/throat, SOB, or low BP? No ?Did it involve sudden or severe rash/hives, skin peeling, or any reaction on the inside of your mouth or nose? No ?Did you need to seek medical attention at a hospital or doctor's office? No ?When did it last happen?      20-30 years ?If all above answers are ?NO?, may proceed with cephalosporin use. ?  ? Adhesive [Tape] Other (See Comments)  ?  Tears skin off - use paper tape   ? Codeine Other (See Comments)  ?  hallucinations  ? Crestor [Rosuvastatin Calcium]   ?  Severe muscle cramps  ? Morphine And Related Nausea And Vomiting and Other (See Comments)  ?  Migraine headaches  ? Vicodin [Hydrocodone-Acetaminophen] Other (See Comments)  ?  hallucinations  ? Latex Rash  ? ? ?Family History  ?Problem Relation Age of Onset  ? Heart disease Mother   ? Hypertension Mother   ? Diabetes Mother   ? Heart failure Mother   ? Asthma Sister   ? Anxiety disorder Sister   ? Depression Sister   ? Hypertension Father   ? Asthma Daughter   ? Bipolar disorder Daughter   ? Cancer Maternal Uncle   ?     [place]  ? Cancer Other   ?     ovarian  ? Liver cancer Paternal Grandmother   ?     ????  ? ? ?Prior to Admission medications   ?Medication Sig Start Date End Date Taking? Authorizing Provider  ?Accu-Chek Softclix Lancets lancets Use as directed up to 4 times daily ?Patient taking differently: 1 each by Other route in the morning, at noon, in the evening, and at bedtime. 04/11/21   Elodia Florence., MD  ?acetaminophen (TYLENOL) 325 MG tablet Take 650 mg by mouth every 6 (six) hours as needed for headache or fever (pain).    [provider]  ?albuterol (PROAIR HFA) 108 (90 Base) MCG/ACT inhaler Inhale 2 puffs into the lungs every 6 (six) hours  as needed for wheezing or shortness of breath. 11/05/20   Lucretia Kern, DO  ?Blood Glucose Monitoring Suppl (ACCU-CHEK GUIDE) w/Device KIT Use as directed ?Patient taking differently: 1 each by Other route as directed. 04/11/21   Elodia Florence., MD  ?cholecalciferol (VITAMIN D3) 25 MCG (1000 UNIT) tablet Take 2,000 Units by mouth daily.    [provider]  ?citalopram (CELEXA) 20 MG tablet Take 1 tablet (20 mg total) by mouth daily. 01/30/22   Biagio Borg, MD  ?clonazePAM (KLONOPIN) 1 MG tablet Take 1 tablet (1 mg total) by mouth 2 (two) times daily as needed for anxiety. 12/31/21   Biagio Borg, MD  ?cyclobenzaprine (FLEXERIL) 10 MG tablet TAKE 1 TABLET BY MOUTH 2 TIMES A DAY AS NEEDED FOR MUSCLE SPASMS ?Patient taking differently: Take 10 mg by mouth daily  as needed. TAKE 1 TABLET BY MOUTH 2 TIMES A DAY AS NEEDED FOR MUSCLE SPASMS 02/27/21   Biagio Borg, MD  ?Fexofenadine HCl (ALLEGRA PO) Take 180 mg by mouth daily as needed (allergies).    [provider]  ?glucose blood (ACCU-CHEK GUIDE) test strip Use as instructed up to 4 times daily 04/11/21   Elodia Florence., MD  ?Magnesium 400 MG TABS Take 400 mg by mouth in the morning and at bedtime. 03/10/22   Hoyt Koch, MD  ?Metamucil Fiber CHEW Chew 3 tablets by mouth at bedtime.    [provider]  ?metolazone (ZAROXOLYN) 5 MG tablet Take 5 mg by mouth 2 (two) times a week. Per nephrologist    Claudia Desanctis, MD  ?metoprolol tartrate (LOPRESSOR) 25 MG tablet Take 25 mg by mouth 2 (two) times daily.    [provider]  ?NARCAN 4 MG/0.1ML LIQD nasal spray kit Place 0.4 mg into the nose daily as needed (opioid overdose). 04/25/19   [provider]  ?ondansetron (ZOFRAN ODT) 4 MG disintegrating tablet Take 1 tablet (4 mg total) by mouth every 4 (four) hours as needed for nausea or vomiting. 08/23/19   Charlesetta Shanks, MD  ?Oxycodone HCl 10 MG TABS Take 1 tablet (10 mg total) 4 (four) times daily by mouth.  Per [facility] ?Patient taking differently: Take 10 mg by mouth every 4 (four) hours as needed (pain). Per [facility] 11/08/17   Biagio Borg, MD  ?pantoprazole (PROTONIX) 40 MG tablet TAKE ONE TA

## 2022-04-30 NOTE — ED Provider Notes (Signed)
?[facility] ?Provider Note ? ? ?CSN: [id number] ?Arrival date & time: 04/30/22  1813 ? ?  ? ?History ? ?Chief Complaint  ?Patient presents with  ? Leg Swelling  ? ? ?Maria [name] is a 66 y.o. female who presents emergency department for peripheral edema.  She has a past medical history of obesity, acquired lymphedema of the lower extremities, CHF, CKD, diabetes and is followed by Dr. Percival Spanish of cardiology.  Patient states she has chronic swelling of her lower extremities. She feels it has been worse. She has been taking demadex and zaroxlyn .She has been taking this without any improvement in her symptoms.  EMS reports the patient became very winded trying to transfer to the stretcher.  She had no hypoxia.  Patient reports that she spoke with Dr. Percival Spanish who  told her to come to the ER for hypokalemia and "possible" infection due to high white count. She states that her legs are more red than normal.  She denies fevers or chills ? ?HPI ? ?  ? ?Home Medications ?Prior to Admission medications   ?Medication Sig Start Date End Date Taking? Authorizing Provider  ?Accu-Chek Softclix Lancets lancets Use as directed up to 4 times daily ?Patient taking differently: 1 each by Other route in the morning, at noon, in the evening, and at bedtime. 04/11/21   Elodia Florence., MD  ?acetaminophen (TYLENOL) 325 MG tablet Take 650 mg by mouth every 6 (six) hours as needed for headache or fever (pain).    [provider]  ?albuterol (PROAIR HFA) 108 (90 Base) MCG/ACT inhaler Inhale 2 puffs into the lungs every 6 (six) hours as needed for wheezing or shortness of breath. 11/05/20   Lucretia Kern, DO  ?Blood Glucose Monitoring Suppl (ACCU-CHEK GUIDE) w/Device KIT Use as directed ?Patient taking differently: 1 each by Other route as directed. 04/11/21   Elodia Florence., MD  ?cholecalciferol (VITAMIN D3) 25 MCG (1000 UNIT) tablet Take 2,000 Units by mouth daily.    [provider]  ?citalopram (CELEXA) 20 MG tablet Take 1 tablet (20 mg total) by mouth daily. 01/30/22   Biagio Borg, MD  ?clonazePAM (KLONOPIN) 1 MG tablet Take 1 tablet (1 mg total) by mouth 2 (two) times daily as needed for anxiety. 12/31/21   Biagio Borg, MD  ?cyclobenzaprine (FLEXERIL) 10 MG tablet TAKE 1 TABLET BY MOUTH 2 TIMES A DAY AS NEEDED FOR MUSCLE SPASMS ?Patient taking differently: Take 10 mg by mouth daily as needed. TAKE 1 TABLET BY MOUTH 2 TIMES A DAY AS NEEDED FOR MUSCLE SPASMS 02/27/21   Biagio Borg, MD  ?Fexofenadine HCl (ALLEGRA PO) Take 180 mg by mouth daily as needed (allergies).    [provider]  ?glucose blood (ACCU-CHEK GUIDE) test strip Use as instructed up to 4 times daily 04/11/21   Elodia Florence., MD  ?Magnesium 400 MG TABS Take 400 mg by mouth in the morning and at bedtime. 03/10/22   Hoyt Koch, MD  ?Metamucil Fiber CHEW Chew 3 tablets by mouth at bedtime.    [provider]  ?metolazone (ZAROXOLYN) 5 MG tablet Take 5 mg by mouth 2 (two) times a week. Per nephrologist    Claudia Desanctis, MD  ?metoprolol tartrate (LOPRESSOR) 25 MG tablet Take 25 mg by mouth 2 (two) times daily.    [provider]  ?NARCAN 4 MG/0.1ML LIQD nasal spray kit Place 0.4 mg into the  nose daily as needed (opioid overdose). 04/25/19   [provider]  ?ondansetron (ZOFRAN ODT) 4 MG disintegrating tablet Take 1 tablet (4 mg total) by mouth every 4 (four) hours as needed for nausea or vomiting. 08/23/19   Charlesetta Shanks, MD  ?Oxycodone HCl 10 MG TABS Take 1 tablet (10 mg total) 4 (four) times daily by mouth. Per [facility] ?Patient taking differently: Take 10 mg by mouth every 4 (four) hours as needed (pain). Per [facility] 11/08/17   Biagio Borg, MD  ?pantoprazole (PROTONIX) 40 MG tablet TAKE ONE TABLET BY MOUTH DAILY 01/29/22   Biagio Borg, MD  ?Polyethyl Glycol-Propyl Glycol 0.4-0.3 % SOLN Apply 1 drop to eye daily as needed (dry eyes).     [provider]  ?polyethylene glycol powder (GLYCOLAX/MIRALAX) 17 GM/SCOOP powder Take 17 g by mouth daily as needed for mild constipation. 04/11/21   Elodia Florence., MD  ?potassium chloride SA (KLOR-CON M) 20 MEQ tablet 2 tablets in the morning and 1 tablet in the afternoon 04/23/22   Minus Breeding, MD  ?pramipexole (MIRAPEX) 0.5 MG tablet Take 1 tablet (0.5 mg total) by mouth 3 (three) times daily. 02/03/22   Biagio Borg, MD  ?Semaglutide, 1 MG/DOSE, 4 MG/3ML SOPN Inject 1 mg as directed once a week. 01/30/22   Biagio Borg, MD  ?torsemide (DEMADEX) 20 MG tablet Take 2 tablets in the morning and 1 tablet in the afternoon. ?Patient taking differently: 53m twice daily - per nephrologist 12/30/21   HMinus Breeding MD  ?DULoxetine (CYMBALTA) 60 MG capsule Take 60 mg by mouth daily.    03/09/12  [provider]  ?   ? ?Allergies    ?Amoxicillin-pot clavulanate, Adhesive [tape], Codeine, Crestor [rosuvastatin calcium], Morphine and related, Vicodin [hydrocodone-acetaminophen], and Latex   ? ?Review of Systems   ?Review of Systems ? ?Physical Exam ?Updated Vital Signs ?BP (!) 109/46   Pulse (!) 58   Temp 98 ?F (36.7 ?C) (Oral)   Resp 18   SpO2 99%  ?Physical Exam ?Vitals and nursing note reviewed.  ?Constitutional:   ?   General: She is not in acute distress. ?   Appearance: She is well-developed. She is not diaphoretic.  ?HENT:  ?   Head: Normocephalic and atraumatic.  ?   Right Ear: External ear normal.  ?   Left Ear: External ear normal.  ?   Nose: Nose normal.  ?   Mouth/Throat:  ?   Mouth: Mucous membranes are moist.  ?Eyes:  ?   General: No scleral icterus. ?   Conjunctiva/sclera: Conjunctivae normal.  ?Cardiovascular:  ?   Rate and Rhythm: Normal rate and regular rhythm.  ?   Heart sounds: Normal heart sounds. No murmur heard. ?  No friction rub. No gallop.  ?Pulmonary:  ?   Effort: Pulmonary effort is normal. No respiratory distress.  ?   Breath sounds: Normal breath sounds. No  wheezing, rhonchi or rales.  ?Abdominal:  ?   General: Bowel sounds are normal. There is no distension.  ?   Palpations: Abdomen is soft. There is no mass.  ?   Tenderness: There is no abdominal tenderness. There is no guarding.  ?Musculoskeletal:  ?   Cervical back: Normal range of motion.  ?   Right lower leg: Edema present.  ?   Left lower leg: Edema present.  ?   Comments: BL Equal sever non-pitting edema of the BL Lower extremities ?Associated  changes of skin noted due to chronic edema ?Mild erythema of the lower extremities consistent with dermatitis  ?Skin: ?   General: Skin is warm and dry.  ?Neurological:  ?   Mental Status: She is alert and oriented to person, place, and time.  ?Psychiatric:     ?   Behavior: Behavior normal.  ? ? ?ED Results / Procedures / Treatments   ?Labs ?(all labs ordered are listed, but only abnormal results are displayed) ?Labs Reviewed  ?BASIC METABOLIC PANEL - Abnormal; Notable for the following components:  ?    Result Value  ? Sodium 134 (*)   ? Potassium 2.7 (*)   ? Chloride 87 (*)   ? CO2 38 (*)   ? Creatinine, Ser 1.09 (*)   ? GFR, Estimated 57 (*)   ? All other components within normal limits  ?CBC WITH DIFFERENTIAL/PLATELET - Abnormal; Notable for the following components:  ? WBC 11.4 (*)   ? All other components within normal limits  ?BRAIN NATRIURETIC PEPTIDE  ? ? ?EKG ?None ? ?Radiology ?DG Chest 2 View ? ?Result Date: 04/30/2022 ?CLINICAL DATA:  Shortness of breath EXAM: CHEST - 2 VIEW COMPARISON:  12/18/2021 FINDINGS: Cardiomegaly. No confluent airspace opacities, effusions or overt edema. No acute bony abnormality. IMPRESSION: Cardiomegaly.  No active disease. Electronically Signed   By: Kevin  Dover M.D.   On: 04/30/2022 19:05   ? ?Procedures ?Procedures  ? ? ?Medications Ordered in ED ?Medications  ?magnesium sulfate IVPB 2 g 50 mL (0 g Intravenous Stopped 05/01/22 0150)  ?potassium chloride 10 mEq in 100 mL IVPB (0 mEq Intravenous Stopped 05/01/22 0150)  ?potassium  chloride SA (KLOR-CON M) CR tablet 40 mEq (40 mEq Oral Given 04/30/22 2326)  ?fentaNYL (SUBLIMAZE) injection 50 mcg (50 mcg Intravenous Given 05/01/22 0010)  ?ondansetron (ZOFRAN) injection 4 mg (4 mg Intrav

## 2022-04-30 NOTE — ED Triage Notes (Signed)
Pt BIB [facility] with reports of leg swelling and trouble walking. Pt had hypokalemia and elevated WBCs. Per E[facility] pt was [facility] on exertion.  ?

## 2022-04-30 NOTE — Subjective & Objective (Signed)
CC: lower leg swelling not improving on diuretics ?HPI: ?66 year old Caucasian female history of type 2 diabetes, hypertension, morbid obesity, chronic diastolic heart failure presents to the ER today with lower leg swelling that is not improving on diuretics.  Patient diagnosed with lymphedema.  Was seen by wound care clinic.  She was not agreeable to compression stockings.  She was referred to lymphedema clinic.  Patient's been on aggressive diuretic regimen over the last week including daily metolazone, 40 mg of Demadex in the morning and 20 mg in the evening.  Patient states that she has been urinating well.  She has not noticed any change in the amount of leg swelling that she has.  She was referred to the ER by her cardiology office. ? ?Patient states that her weight was 376 pounds today and it was 378 pounds yesterday.  She wonders why despite having decreasing weight, she has not seen any improvement in the amount of leg swelling she has. ? ?Patient denies any shortness of breath,Chest pain, Diarrhea.  No nausea, vomiting. ? ?[facility] hospitalist contacted for consultation. ?

## 2022-04-30 NOTE — Telephone Encounter (Signed)
Calling to make Dr. Percival Spanish aware of something's that's going on with pt. They would like a call back. Please advise ?

## 2022-05-01 ENCOUNTER — Telehealth: Payer: Self-pay | Admitting: Cardiology

## 2022-05-01 DIAGNOSIS — I5033 Acute on chronic diastolic (congestive) heart failure: Secondary | ICD-10-CM | POA: Diagnosis not present

## 2022-05-01 NOTE — Discharge Instructions (Addendum)
Stop taking your Zaroxalyn. ?Go back to your previous schedule of 2 times a week torsemide. ?You will need to follow-up with your primary care doctor about getting treatment for the lymphedema in your legs.  This will not get better with diuretics no matter how many diuretics you take.  If you are trying to use diuretics to treat this you may end up with kidney failure or low potassium as tonight.  Please continue taking your potassium daily at home for the next 3 days. ? ?

## 2022-05-01 NOTE — Telephone Encounter (Signed)
Spoke to patient stated she wanted Dr.Hochrein to know the PA in ED that saw her yesterday was very rude.Stated she was told she had lymphedema in her lower legs.Stated her potassium was low.She received IV potassium.Stated [facility] came out this morning and did lab to check potassium.Advised I will make Dr.Hochrein aware. ?

## 2022-05-01 NOTE — Telephone Encounter (Signed)
Patient went to the ED yesterday as advised for treatment. She wanted to let DR. Hochrein know that during the ED visit the patient was not treated respectfully by the Female Doctor attending to her. She was not admitted and released. ? ?She also had blood drawn today by the [facility] services and wanted to know if Dr. Percival Spanish got those results  ?

## 2022-05-02 DIAGNOSIS — E876 Hypokalemia: Secondary | ICD-10-CM | POA: Diagnosis not present

## 2022-05-04 DIAGNOSIS — I5033 Acute on chronic diastolic (congestive) heart failure: Secondary | ICD-10-CM | POA: Diagnosis not present

## 2022-05-04 DIAGNOSIS — Z7985 Long-term (current) use of injectable non-insulin antidiabetic drugs: Secondary | ICD-10-CM | POA: Diagnosis not present

## 2022-05-04 DIAGNOSIS — E1122 Type 2 diabetes mellitus with diabetic chronic kidney disease: Secondary | ICD-10-CM | POA: Diagnosis not present

## 2022-05-04 NOTE — Telephone Encounter (Signed)
Called patient left message on personal voice mail to call back. 

## 2022-05-04 NOTE — Telephone Encounter (Signed)
Received a call from patient Dr.Hochrein's advice given.[place] will be coming today to draw blood.Advised make sure a bmet is done. ?

## 2022-05-05 ENCOUNTER — Telehealth: Payer: Self-pay | Admitting: Cardiology

## 2022-05-05 ENCOUNTER — Ambulatory Visit (INDEPENDENT_AMBULATORY_CARE_PROVIDER_SITE_OTHER): Payer: Medicare Other | Admitting: *Deleted

## 2022-05-05 DIAGNOSIS — I5032 Chronic diastolic (congestive) heart failure: Secondary | ICD-10-CM

## 2022-05-05 DIAGNOSIS — E1169 Type 2 diabetes mellitus with other specified complication: Secondary | ICD-10-CM

## 2022-05-05 NOTE — Chronic Care Management (AMB) (Signed)
?Chronic Care Management  ? ?CCM RN Visit Note ? ?05/05/2022 ?Name: Maria [name] MRN: [id number] DOB: 1956/04/20 ? ?Subjective: ?Maria [name] is a 66 y.o. year old female who is a primary care patient of Biagio Borg, MD. The care management team was consulted for assistance with disease management and care coordination needs.   ? ?Engaged with patient by telephone for  case closure  in response to provider referral for case management and/or care coordination services.  ? ?Consent to Services:  ?The patient was given information about Chronic Care Management services, agreed to services, and gave verbal consent prior to initiation of services.  Please see initial visit note for detailed documentation.  ?Patient agreed to services and verbal consent obtained.  ? ?Assessment: Review of patient past medical history, allergies, medications, health status, including review of consultants reports, laboratory and other test data, was performed as part of comprehensive evaluation and provision of chronic care management services.  ?CCM Care Plan ? ?Allergies  ?Allergen Reactions  ? Amoxicillin-Pot Clavulanate Nausea And Vomiting  ?  Projectile vomiting ?Did it involve swelling of the face/tongue/throat, SOB, or low BP? No ?Did it involve sudden or severe rash/hives, skin peeling, or any reaction on the inside of your mouth or nose? No ?Did you need to seek medical attention at a hospital or doctor's office? No ?When did it last happen?      20-30 years ?If all above answers are ?NO?, may proceed with cephalosporin use. ?  ? Adhesive [Tape] Other (See Comments)  ?  Tears skin off - use paper tape   ? Codeine Other (See Comments)  ?  hallucinations  ? Crestor [Rosuvastatin Calcium]   ?  Severe muscle cramps  ? Morphine And Related Nausea And Vomiting and Other (See Comments)  ?  Migraine headaches  ? Vicodin [Hydrocodone-Acetaminophen] Other (See Comments)  ?  hallucinations  ? Latex Rash  ? ?Outpatient Encounter  Medications as of 05/05/2022  ?Medication Sig  ? Accu-Chek Softclix Lancets lancets Use as directed up to 4 times daily (Patient taking differently: 1 each by Other route in the morning, at noon, in the evening, and at bedtime.)  ? acetaminophen (TYLENOL) 325 MG tablet Take 650 mg by mouth every 6 (six) hours as needed for headache or fever (pain).  ? albuterol (PROAIR HFA) 108 (90 Base) MCG/ACT inhaler Inhale 2 puffs into the lungs every 6 (six) hours as needed for wheezing or shortness of breath.  ? Blood Glucose Monitoring Suppl (ACCU-CHEK GUIDE) w/Device KIT Use as directed (Patient taking differently: 1 each by Other route as directed.)  ? cholecalciferol (VITAMIN D3) 25 MCG (1000 UNIT) tablet Take 2,000 Units by mouth daily.  ? citalopram (CELEXA) 20 MG tablet Take 1 tablet (20 mg total) by mouth daily.  ? clonazePAM (KLONOPIN) 1 MG tablet Take 1 tablet (1 mg total) by mouth 2 (two) times daily as needed for anxiety.  ? cyclobenzaprine (FLEXERIL) 10 MG tablet TAKE 1 TABLET BY MOUTH 2 TIMES A DAY AS NEEDED FOR MUSCLE SPASMS (Patient taking differently: Take 10 mg by mouth daily as needed. TAKE 1 TABLET BY MOUTH 2 TIMES A DAY AS NEEDED FOR MUSCLE SPASMS)  ? Fexofenadine HCl (ALLEGRA PO) Take 180 mg by mouth daily as needed (allergies).  ? glucose blood (ACCU-CHEK GUIDE) test strip Use as instructed up to 4 times daily  ? Magnesium 400 MG TABS Take 400 mg by mouth in the morning and at bedtime.  ? Metamucil  Fiber CHEW Chew 3 tablets by mouth at bedtime.  ? metolazone (ZAROXOLYN) 5 MG tablet Take 5 mg by mouth 2 (two) times a week. Per nephrologist  ? metoprolol tartrate (LOPRESSOR) 25 MG tablet Take 25 mg by mouth 2 (two) times daily.  ? NARCAN 4 MG/0.1ML LIQD nasal spray kit Place 0.4 mg into the nose daily as needed (opioid overdose).  ? ondansetron (ZOFRAN ODT) 4 MG disintegrating tablet Take 1 tablet (4 mg total) by mouth every 4 (four) hours as needed for nausea or vomiting.  ? Oxycodone HCl 10 MG TABS Take 1  tablet (10 mg total) 4 (four) times daily by mouth. Per [facility] (Patient taking differently: Take 10 mg by mouth every 4 (four) hours as needed (pain). Per [facility])  ? pantoprazole (PROTONIX) 40 MG tablet TAKE ONE TABLET BY MOUTH DAILY  ? Polyethyl Glycol-Propyl Glycol 0.4-0.3 % SOLN Apply 1 drop to eye daily as needed (dry eyes).  ? polyethylene glycol powder (GLYCOLAX/MIRALAX) 17 GM/SCOOP powder Take 17 g by mouth daily as needed for mild constipation.  ? potassium chloride SA (KLOR-CON M) 20 MEQ tablet 2 tablets in the morning and 1 tablet in the afternoon  ? pramipexole (MIRAPEX) 0.5 MG tablet Take 1 tablet (0.5 mg total) by mouth 3 (three) times daily.  ? Semaglutide, 1 MG/DOSE, 4 MG/3ML SOPN Inject 1 mg as directed once a week.  ? torsemide (DEMADEX) 20 MG tablet Take 2 tablets in the morning and 1 tablet in the afternoon. (Patient taking differently: 17m twice daily - per nephrologist)  ? [DISCONTINUED] DULoxetine (CYMBALTA) 60 MG capsule Take 60 mg by mouth daily.    ? ?No facility-administered encounter medications on file as of 05/05/2022.  ? ?Patient Active Problem List  ? Diagnosis Date Noted  ? Dyspnea 12/19/2021  ? Leg swelling 11/20/2021  ? Bilateral knee pain 10/29/2021  ? Hypernatremia 09/24/2021  ? CKD (chronic kidney disease), stage IIIa ([facility] 09/24/2021  ? Sacral decubitus ulcer, stage II ([facility] 09/20/2021  ? Acute on chronic diastolic CHF (congestive heart failure) ([facility] 09/19/2021  ? Nail disorder 07/07/2021  ? Pre-ulcerative corn or callous 07/07/2021  ? Rash 07/07/2021  ? Diastolic congestive heart failure ([facility] 03/17/2021  ? Diabetes mellitus type 2 in obese ([facility] 03/11/2021  ? Vitamin D deficiency 01/01/2021  ? Medial epicondylitis 12/31/2020  ? Aortic atherosclerosis ([facility] 09/26/2020  ? Umbilical hernia 183/66/2947 ? Constipation 08/05/2020  ? Arthritis 06/12/2020  ? Hoarseness 05/01/2020  ? Vitamin B 12 deficiency 01/31/2020  ? Rosacea 01/31/2020  ? Neck swelling  01/31/2020  ? History of colonic polyps   ? Benign neoplasm of [place]   ? Nausea 08/31/2019  ? Throat pain 07/26/2019  ? HLD (hyperlipidemia) 05/24/2019  ? Leg cramps 05/24/2019  ? Lump in neck 05/24/2019  ? Left thyroid nodule 01/25/2019  ? Jerking movements of extremities 07/20/2018  ? Balance disorder 07/20/2018  ? Right knee pain 03/21/2018  ? OSA (obstructive sleep apnea) 03/21/2018  ? Oxygen desaturation 03/21/2018  ? Urinary symptom or sign 03/21/2018  ? Lymphedema of both legs 01/25/2018  ? Gait disorder 01/25/2018  ? Dysphonia 12/07/2017  ? Encounter for well adult exam with abnormal findings 11/08/2017  ? 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[facility] 05/31/2017  ? [place] polyp 08/11/2016  ? Abdominal pain, epigastric   ? Dysphagia   ? [place] cancer screening   ? ACE-inhibitor cough 12/05/2015  ? Restless legs syndrome 10/08/2015  ? Venous stasis dermatitis of both lower extremities 04/26/2015  ?  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/17/2014  ? AR (allergic rhinitis) 10/17/2014  ? Fatty liver 10/17/2014  ? Chronic pain syndrome 09/19/2014  ? Post-traumatic osteoarthritis of both knees 09/19/2014  ? Spinal stenosis 09/19/2014  ? Depression, major, single episode, complete remission ([place]) 09/19/2014  ? Narcotic dependence ([place]) 09/19/2014  ? Hypokalemia 06/29/2012  ? Spondylosis of 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 03/09/2012  ? Lumbar disc disease 03/09/2012  ? Chronic pain 03/09/2012  ? Morbid obesity ([place]) 06/17/2011  ? Chronic low back pain 12/28/2008  ? Anxiety state 12/06/2007  ? Depression 12/06/2007  ? Essential hypertension 12/06/2007  ? Gastroesophageal reflux disease without esophagitis 12/06/2007  ? LOW BACK PAIN 12/06/2007  ? ?Conditions to be addressed/monitored:  CHF and DMII ? ?Care Plan : RN Care Manager Plan of Care  ?Updates made by [name] Royalty, RN since 05/05/2022 12:00 AM  ?  ? ?Problem: Chronic Disease Management Needs   ?Priority: Medium  ?  ? ?Long-Range Goal: Ongoing adherence to plan  of care for long term chronic disease management   ?Start Date: 08/26/2021  ?Expected End Date: 08/26/2022  ?Priority: Medium  ?Note:   ?Current Barriers:  ?Chronic Disease Management support and education need

## 2022-05-05 NOTE — Telephone Encounter (Signed)
Returned call to [facility]-spoke to NP seeing patient. ? ?She states she saw patient yesterday and will be seeing her again tomorrow.  Labs from yesterday are as follows: ?Creatinine 1.06  ?BUN 8 ?K 4.1 ? ?Patient has gained 2 lbs overnight.  She is currently taking torsemide 40 mg BID and potassium 40 meq BID.   Due to weight gain, she is wondering if Dr. Percival Spanish recommends increasing torsemide or possibly adding metolazone.   She sees patient again tomorrow and can repeat labs again if needed. ? ? ?Advised would route to MD to review.  ? ?

## 2022-05-05 NOTE — Telephone Encounter (Signed)
?  Maria [name] PT with [facility] calling, she said, pt had her labs today and results was sent to Dr. Percival Spanish, they wanted to make sure Dr. Percival Spanish received it. Also, pt had a 2 lbs weight gained overnight, pt is going to be seen by nurse tomorrow and wanted to check in with Dr. Percival Spanish if there's something he recommends for pt to take today. ?

## 2022-05-06 DIAGNOSIS — N1831 Chronic kidney disease, stage 3a: Secondary | ICD-10-CM | POA: Diagnosis not present

## 2022-05-06 DIAGNOSIS — Z7409 Other reduced mobility: Secondary | ICD-10-CM | POA: Diagnosis not present

## 2022-05-06 DIAGNOSIS — I5033 Acute on chronic diastolic (congestive) heart failure: Secondary | ICD-10-CM | POA: Diagnosis not present

## 2022-05-06 DIAGNOSIS — E1122 Type 2 diabetes mellitus with diabetic chronic kidney disease: Secondary | ICD-10-CM | POA: Diagnosis not present

## 2022-05-07 NOTE — Telephone Encounter (Signed)
Minus Breeding, MD  You 20 hours ago (8:58 PM)   Increase Torsemide to 80 mg in the AM and continue PM dose.  I would suggest adding another 40 meq daily of Kdur and repeating a BMET next week.      Spoke to [place] at [employer] of recommendations and verbalized understanding.

## 2022-05-08 DIAGNOSIS — E876 Hypokalemia: Secondary | ICD-10-CM | POA: Diagnosis not present

## 2022-05-08 DIAGNOSIS — I5033 Acute on chronic diastolic (congestive) heart failure: Secondary | ICD-10-CM | POA: Diagnosis not present

## 2022-05-11 DIAGNOSIS — I110 Hypertensive heart disease with heart failure: Secondary | ICD-10-CM | POA: Diagnosis not present

## 2022-05-11 DIAGNOSIS — I5033 Acute on chronic diastolic (congestive) heart failure: Secondary | ICD-10-CM | POA: Diagnosis not present

## 2022-05-11 DIAGNOSIS — E785 Hyperlipidemia, unspecified: Secondary | ICD-10-CM | POA: Diagnosis not present

## 2022-05-12 DIAGNOSIS — I5033 Acute on chronic diastolic (congestive) heart failure: Secondary | ICD-10-CM | POA: Diagnosis not present

## 2022-05-12 DIAGNOSIS — E876 Hypokalemia: Secondary | ICD-10-CM | POA: Diagnosis not present

## 2022-05-12 DIAGNOSIS — I130 Hypertensive heart and chronic kidney disease with heart failure and stage 1 through stage 4 chronic kidney disease, or unspecified chronic kidney disease: Secondary | ICD-10-CM | POA: Diagnosis not present

## 2022-05-13 DIAGNOSIS — I890 Lymphedema, not elsewhere classified: Secondary | ICD-10-CM | POA: Diagnosis not present

## 2022-05-13 DIAGNOSIS — Z87440 Personal history of urinary (tract) infections: Secondary | ICD-10-CM | POA: Diagnosis not present

## 2022-05-13 DIAGNOSIS — T502X5A Adverse effect of carbonic-anhydrase inhibitors, benzothiadiazides and other diuretics, initial encounter: Secondary | ICD-10-CM | POA: Diagnosis not present

## 2022-05-14 DIAGNOSIS — I5033 Acute on chronic diastolic (congestive) heart failure: Secondary | ICD-10-CM | POA: Diagnosis not present

## 2022-05-19 DIAGNOSIS — I5033 Acute on chronic diastolic (congestive) heart failure: Secondary | ICD-10-CM | POA: Diagnosis not present

## 2022-05-19 DIAGNOSIS — E876 Hypokalemia: Secondary | ICD-10-CM | POA: Diagnosis not present

## 2022-05-20 DIAGNOSIS — I509 Heart failure, unspecified: Secondary | ICD-10-CM

## 2022-05-20 DIAGNOSIS — E1169 Type 2 diabetes mellitus with other specified complication: Secondary | ICD-10-CM

## 2022-05-25 DIAGNOSIS — I5033 Acute on chronic diastolic (congestive) heart failure: Secondary | ICD-10-CM | POA: Diagnosis not present

## 2022-05-26 DIAGNOSIS — I5033 Acute on chronic diastolic (congestive) heart failure: Secondary | ICD-10-CM | POA: Diagnosis not present

## 2022-05-28 ENCOUNTER — Telehealth: Payer: Self-pay | Admitting: Licensed Clinical Social Worker

## 2022-05-28 NOTE — Telephone Encounter (Signed)
Follow up call placed to pt regarding transportation forms ([employer] [place]).  No answer at [phone]; voicemail left requesting call back.   Westley Hummer, MSW, [place]  [phone]- work cell phone (preferred) [phone]- desk phone

## 2022-06-01 ENCOUNTER — Other Ambulatory Visit: Payer: Self-pay | Admitting: Physician Assistant

## 2022-06-02 DIAGNOSIS — I890 Lymphedema, not elsewhere classified: Secondary | ICD-10-CM | POA: Diagnosis not present

## 2022-06-02 DIAGNOSIS — R110 Nausea: Secondary | ICD-10-CM | POA: Diagnosis not present

## 2022-06-02 DIAGNOSIS — I5033 Acute on chronic diastolic (congestive) heart failure: Secondary | ICD-10-CM | POA: Diagnosis not present

## 2022-06-02 DIAGNOSIS — I130 Hypertensive heart and chronic kidney disease with heart failure and stage 1 through stage 4 chronic kidney disease, or unspecified chronic kidney disease: Secondary | ICD-10-CM | POA: Diagnosis not present

## 2022-06-03 ENCOUNTER — Telehealth: Payer: Self-pay | Admitting: Licensed Clinical Social Worker

## 2022-06-03 NOTE — Telephone Encounter (Signed)
H&V Care Navigation CSW Progress Note  Clinical Social Worker contacted patient by phone to f/u on [employer] [place] forms. Was able to reach her at [phone]. She apologizes for missing my call the other day, she and her sister have been doing the Bible in a Year and she turns off her phone alerts during this time and often forgets to turn them back on. She confirms she has received her [employer] [place] paperwork. She is going to work on it and we discussed that I would try and see if [facility] could assist with coordinating getting that to [place] during her next home visit. Pt shares she is very pleased with her primary care coordination with [facility], she has felt more managed and cared for by being seen at home. She is down to 355 lbs and extremely pleased with that ("I can even cross my ankles!"). Only complaint at this time is her knee is bothering her. She previously was told she may not be surgical candidate by ortho, I encouraged her to see if [facility] will re-refer her to MD as her weight is down to gain more clarity. Pt will contact me if any questions arise when she completes the paperwork. I will send an email to liaison Reche Dixon w/ [facility] to see if she can assist with above request.   Patient is participating in a Managed Medicaid Plan:  No  SDOH Screenings   Alcohol Screen: Low Risk  (12/30/2021)   Alcohol Screen    Last Alcohol Screening Score (AUDIT): 0  Depression (PHQ2-9): Medium Risk (01/30/2022)   Depression (PHQ2-9)    PHQ-2 Score: 9  Financial Resource Strain: Medium Risk (12/30/2021)   Overall Financial Resource Strain (CARDIA)    Difficulty of Paying Living Expenses: Somewhat hard  Food Insecurity: No Food Insecurity (04/03/2022)   Hunger Vital Sign    Worried About Running Out of Food in the Last Year: Never true    Ran Out of Food in the Last Year: Never true  Housing: Low Risk  (04/03/2022)   Housing    Last Housing Risk Score: 0  Physical Activity: Inactive  (12/30/2021)   Exercise Vital Sign    Days of Exercise per Week: 0 days    Minutes of Exercise per Session: 0 min  Social Connections: Socially Isolated (12/30/2021)   Social Connection and Isolation Panel [NHANES]    Frequency of Communication with Friends and Family: More than three times a week    Frequency of Social Gatherings with Friends and Family: More than three times a week    Attends Religious Services: Never    Marine scientist or Organizations: No    Attends Archivist Meetings: Never    Marital Status: Never married  Stress: No Stress Concern Present (12/30/2021)   [employer] of [place] of Stress : Not at all  Tobacco Use: Medium Risk (05/05/2022)   Patient History    Smoking Tobacco Use: Former    Smokeless Tobacco Use: Never    Passive Exposure: Not on file  Transportation Needs: Unmet Transportation Needs (04/22/2022)   PRAPARE - Hydrologist (Medical): No    Lack of Transportation (Non-Medical): Yes     Westley Hummer, MSW, [place]  [phone]- work cell phone (preferred) [phone]- desk phone

## 2022-06-04 ENCOUNTER — Other Ambulatory Visit: Payer: Self-pay | Admitting: Internal Medicine

## 2022-06-04 ENCOUNTER — Other Ambulatory Visit: Payer: Self-pay | Admitting: Physician Assistant

## 2022-06-04 DIAGNOSIS — E876 Hypokalemia: Secondary | ICD-10-CM | POA: Diagnosis not present

## 2022-06-05 DIAGNOSIS — G4762 Sleep related leg cramps: Secondary | ICD-10-CM | POA: Diagnosis not present

## 2022-06-08 DIAGNOSIS — E785 Hyperlipidemia, unspecified: Secondary | ICD-10-CM | POA: Diagnosis not present

## 2022-06-08 DIAGNOSIS — I110 Hypertensive heart disease with heart failure: Secondary | ICD-10-CM | POA: Diagnosis not present

## 2022-06-09 DIAGNOSIS — R300 Dysuria: Secondary | ICD-10-CM | POA: Diagnosis not present

## 2022-06-10 ENCOUNTER — Telehealth: Payer: Self-pay | Admitting: Licensed Clinical Social Worker

## 2022-06-10 ENCOUNTER — Other Ambulatory Visit: Payer: Self-pay | Admitting: Physician Assistant

## 2022-06-10 NOTE — Telephone Encounter (Signed)
H&V Care Navigation CSW Progress Note  Clinical Social Worker contacted [facility] to f/u on request to have staff send [place] pt transportation paperwork. Was able to reach [facility] on clinical line. Made request and provided my email. Pt has a visit next week and Gwinda Passe will make note on her paperwork to assist with that as able.   Patient is participating in a Managed Medicaid Plan:  No, [place] only  SDOH Screenings   Alcohol Screen: Low Risk  (12/30/2021)   Alcohol Screen    Last Alcohol Screening Score (AUDIT): 0  Depression (PHQ2-9): Medium Risk (01/30/2022)   Depression (PHQ2-9)    PHQ-2 Score: 9  Financial Resource Strain: Medium Risk (12/30/2021)   Overall Financial Resource Strain (CARDIA)    Difficulty of Paying Living Expenses: Somewhat hard  Food Insecurity: No Food Insecurity (04/03/2022)   Hunger Vital Sign    Worried About Running Out of Food in the Last Year: Never true    Ran Out of Food in the Last Year: Never true  Housing: Low Risk  (04/03/2022)   Housing    Last Housing Risk Score: 0  Physical Activity: Inactive (12/30/2021)   Exercise Vital Sign    Days of Exercise per Week: 0 days    Minutes of Exercise per Session: 0 min  Social Connections: Socially Isolated (12/30/2021)   Social Connection and Isolation Panel [NHANES]    Frequency of Communication with Friends and Family: More than three times a week    Frequency of Social Gatherings with Friends and Family: More than three times a week    Attends Religious Services: Never    Marine scientist or Organizations: No    Attends Archivist Meetings: Never    Marital Status: Never married  Stress: No Stress Concern Present (12/30/2021)   [employer] [place] of Stress : Not at all  Tobacco Use: Medium Risk (05/05/2022)   Patient History    Smoking Tobacco Use: Former    Smokeless Tobacco Use: Never    Passive Exposure: Not on  file  Transportation Needs: Unmet Transportation Needs (04/22/2022)   PRAPARE - Hydrologist (Medical): No    Lack of Transportation (Non-Medical): Yes   Westley Hummer, MSW, [place]  [phone]- work cell phone (preferred) [phone]- desk phone

## 2022-06-11 DIAGNOSIS — M25551 Pain in right hip: Secondary | ICD-10-CM | POA: Diagnosis not present

## 2022-06-11 DIAGNOSIS — M25561 Pain in right knee: Secondary | ICD-10-CM | POA: Diagnosis not present

## 2022-06-11 DIAGNOSIS — G894 Chronic pain syndrome: Secondary | ICD-10-CM | POA: Diagnosis not present

## 2022-06-11 DIAGNOSIS — M25552 Pain in left hip: Secondary | ICD-10-CM | POA: Diagnosis not present

## 2022-06-11 DIAGNOSIS — M25562 Pain in left knee: Secondary | ICD-10-CM | POA: Diagnosis not present

## 2022-06-11 DIAGNOSIS — M1711 Unilateral primary osteoarthritis, right knee: Secondary | ICD-10-CM | POA: Diagnosis not present

## 2022-06-11 DIAGNOSIS — M542 Cervicalgia: Secondary | ICD-10-CM | POA: Diagnosis not present

## 2022-06-11 DIAGNOSIS — M545 Low back pain, unspecified: Secondary | ICD-10-CM | POA: Diagnosis not present

## 2022-06-11 DIAGNOSIS — Z79891 Long term (current) use of opiate analgesic: Secondary | ICD-10-CM | POA: Diagnosis not present

## 2022-06-11 DIAGNOSIS — M79661 Pain in right lower leg: Secondary | ICD-10-CM | POA: Diagnosis not present

## 2022-06-11 DIAGNOSIS — M5136 Other intervertebral disc degeneration, lumbar region: Secondary | ICD-10-CM | POA: Diagnosis not present

## 2022-06-11 DIAGNOSIS — J3089 Other allergic rhinitis: Secondary | ICD-10-CM | POA: Diagnosis not present

## 2022-06-11 DIAGNOSIS — M25512 Pain in left shoulder: Secondary | ICD-10-CM | POA: Diagnosis not present

## 2022-06-15 DIAGNOSIS — N390 Urinary tract infection, site not specified: Secondary | ICD-10-CM | POA: Diagnosis not present

## 2022-06-16 DIAGNOSIS — I5033 Acute on chronic diastolic (congestive) heart failure: Secondary | ICD-10-CM | POA: Diagnosis not present

## 2022-06-18 ENCOUNTER — Telehealth: Payer: Medicare Other

## 2022-06-21 NOTE — Progress Notes (Signed)
Cardiology Clinic Note   Patient Name: Maria [name] Date of Encounter: 06/22/2022  Primary Care Provider:  Biagio Borg, MD Primary Cardiologist:  Minus Breeding, MD  Patient Profile    Maria [name] 66 year old female presents to clinic today for follow-up evaluation of her chronic diastolic CHF.  Past Medical History    Past Medical History:  Diagnosis Date   Acute lymphadenitis 2011   ALLERGIC RHINITIS 08/10/2007   ANXIETY 12/06/2007   Atherosclerotic peripheral vascular disease ([place]) 06/13/2013   Aorta on CT June 2014   Cellulitis 2011   Cervical disc disease 03/09/2012   CHF (congestive heart failure) ([place])    Chronic pain 03/09/2012   DEPRESSION 12/06/2007   Diabetes ([place])    DIVERTICULOSIS, [place] 12/06/2007   GERD 12/06/2007   Hepatitis    age 73 hepatitis A   HLD (hyperlipidemia) 05/24/2019   HYPERTENSION 12/06/2007   Lumbar disc disease 03/09/2012   Mesenteric adenitis    MRSA 2006   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 2010   [place]  may 24th 2010   COLONOSCOPY WITH PROPOFOL N/A 07/10/2016   Procedure: COLONOSCOPY WITH PROPOFOL;  Surgeon: Manus Gunning, MD;  Location: [facility];  Service: Gastroenterology;  Laterality: N/A;   COLONOSCOPY WITH PROPOFOL N/A 09/26/2019   Procedure: COLONOSCOPY WITH PROPOFOL;  Surgeon: Yetta Flock, MD;  Location: [facility];  Service: Gastroenterology;  Laterality: N/A;   ESOPHAGOGASTRODUODENOSCOPY (EGD) WITH PROPOFOL N/A 07/10/2016   Procedure: ESOPHAGOGASTRODUODENOSCOPY (EGD) WITH PROPOFOL;  Surgeon: Manus Gunning, MD;  Location: [facility];  Service: Gastroenterology;  Laterality: N/A;   POLYPECTOMY  09/26/2019   Procedure: POLYPECTOMY;  Surgeon: Yetta Flock,  MD;  Location: [facility];  Service: Gastroenterology;;   s/p ovary cyst     s/p right knee arthroscopy     Dr. Mardelle Matte ortho    Allergies  Allergies  Allergen Reactions   Amoxicillin-Pot Clavulanate Nausea And Vomiting    Projectile vomiting Did it involve swelling of the face/tongue/throat, SOB, or low BP? No Did it involve sudden or severe rash/hives, skin peeling, or any reaction on the inside of your mouth or nose? No Did you need to seek medical attention at a hospital or doctor's office? No When did it last happen?      20-30 years If all above answers are "NO", may proceed with cephalosporin use.    Adhesive [Tape] Other (See Comments)    Tears skin off - use paper tape    Codeine Other (See Comments)    hallucinations   Crestor [Rosuvastatin Calcium]     Severe muscle cramps   Morphine And Related Nausea And Vomiting and Other (See Comments)    Migraine headaches   Vicodin [Hydrocodone-Acetaminophen] Other (See Comments)    hallucinations   Latex Rash    History of Present Illness    Maria [name] has a PMH of hyperlipidemia, hypertension, GERD, anxiety, depression, type 2 diabetes, and CHF.  Echocardiogram showed an EF of 06-26% with diastolic dysfunction 9/48/5462.  She presented to the emergency department 08/10/2021 with increased shortness of breath.  She noted a 13 pound weight gain over the previous 7 days with worsening lower extremity edema.  She did note PND and orthopnea.  She  was unable to lay flat and was sleeping in her recliner.  She reported taking torsemide as prescribed.  She denied chest discomfort.  She also denied infectious symptoms aside from increased shortness of breath and increased urinary frequency due to her diuretic.  She did note mild dysuria but denied fever and chills.  Her blood pressure at that time was 123/76 with a pulse of 66.  Her creatinine was 1.05.  Her EKG showed normal sinus rhythm.  She received IV furosemide.  Her chest x-ray  showed persistent cardiac enlargement and central pulmonary vascular engorgement with no change from prior chest x-rays, no signs of lobar consolidation, pleural effusion, or frank edema.  Her BNP at that time was 22.  Troponins were normal.  It was also felt that she had a early UTI.  She was also prescribed Keflex.  Her CTA was negative for acute PE with no evidence of pneumo, pleural effusion, pericardial effusion, aortic dissection or focal consolidation.  Social work evaluated and recommended home physical therapy, prescription for walker.  It was also recommended that she use lower extremity support stockings and elevate her lower extremities to assist with her lower extremity edema.  She was discharged in stable condition on 08/11/2021.   Due to her limits and mobility she was unable to present to the clinic 08/18/2021.  She  was contacted via phone and stated she was frustrated with her lower extremity swelling.  She reported that she had ordered lower extremity support stockings and was excited to give them a try.  She reported that she did not add salt to her food.  She had been consuming about 4 bottles of water per day along with tea.  We reviewed the importance of selecting food options that are low/no salt and fluid restriction.  I recommended that she drink no more than about 48 ounces per day.  She had been taking her torsemide at about 9 or 10:00 and then again at 5 or 6:00 in the evening.  She had not been sleeping well due to frequent urination.  We discussed taking her medication earlier in the day.  I have encouraged her to continue to weigh herself daily and keep a log, Igave her the salty 6 diet sheet, gave parameters for fluid restriction, and asked her to follow-up in 3 months and as needed.  She was seen in follow-up by Almyra Deforest, PA-C on 09/09/2021.  She took 2.5 mg of metolazone and did not notice significant urine output.  She was still taking torsemide 60 mg twice daily.  Her weight at  home was around 367 pounds.  Her weight diary was reviewed and her weight was ranging between 367 pounds up to 375 pounds.  On physical exam she did not have significant lower extremity edema.  Her lung sounds were noted to be clear.  She was euvolemic on exam.  Her medication was continued.  Her BMP showed a creatinine of 1.18 and a potassium of 3.6.   She was seen in follow-up by Dr. Percival Spanish on 04/21/2022.  During that time her weight was around 356 pounds.  She reported she felt like she was carrying more weight in her legs.  She felt like she was having some shortness of breath with ambulation.  She was ambulating with a walker.  She was not having any new PND or orthopnea.  She was noted to have had a fall which resulted in her hitting her head.  She did not want to  go to the emergency department.  She was noted to have some shoulder pain related to her fall.  She denied chest pressure, arm and neck discomfort.  She was not having any new palpitations.  She denied presyncope and syncope.  Her heart rate was noted to be 58.  Her Zaroxolyn was increased to daily for 5 days along with extra potassium.  Salt and fluid restriction were reviewed.  Her BMP on 04/30/2022 showed a potassium of 2.7 and a creatinine of 1.09.  Her follow-up lab work 05/04/2022 was stable.  She presents to the clinic today for follow-up evaluation states she has been having difficulty keeping her fluid off.  Her weight today is 368 pounds.  We reviewed the importance of fluid restriction and sodium restriction.  She expressed understanding.  She reports compliance with her medications.  She does mention that she has been drinking around 90 ounces per day.  She tries to elevate her lower extremities when she is not active.  She reports that the last time she was sent to the emergency department the emergency department physician was not nice to her.  I will double her diuresis, have her take metolazone 3 times this week, order a be met in 1  week, and plan follow-up in 2 weeks.  Today she denies chest pain, shortness of breath, fatigue, palpitations, melena, hematuria, hemoptysis, diaphoresis, weakness, presyncope, syncope, orthopnea, and PND.    Home Medications    Prior to Admission medications   Medication Sig Start Date End Date Taking? Authorizing Provider  Accu-Chek Softclix Lancets lancets Use as directed up to 4 times daily Patient taking differently: 1 each by Other route in the morning, at noon, in the evening, and at bedtime. 04/11/21   Elodia Florence., MD  acetaminophen (TYLENOL) 325 MG tablet Take 650 mg by mouth every 6 (six) hours as needed for headache or fever (pain).    [provider]  albuterol (PROAIR HFA) 108 (90 Base) MCG/ACT inhaler Inhale 2 puffs into the lungs every 6 (six) hours as needed for wheezing or shortness of breath. 11/05/20   Lucretia Kern, DO  Blood Glucose Monitoring Suppl (ACCU-CHEK GUIDE) w/Device KIT Use as directed Patient taking differently: 1 each by Other route as directed. 04/11/21   Elodia Florence., MD  cholecalciferol (VITAMIN D3) 25 MCG (1000 UNIT) tablet Take 2,000 Units by mouth daily.    [provider]  citalopram (CELEXA) 20 MG tablet Take 1 tablet (20 mg total) by mouth daily. 01/30/22   Biagio Borg, MD  clonazePAM (KLONOPIN) 1 MG tablet TAKE ONE TABLET BY MOUTH TWICE DAILY AS NEEDED FOR ANXIETY 06/04/22   Biagio Borg, MD  cyclobenzaprine (FLEXERIL) 10 MG tablet TAKE 1 TABLET BY MOUTH 2 TIMES A DAY AS NEEDED FOR MUSCLE SPASMS Patient taking differently: Take 10 mg by mouth daily as needed. TAKE 1 TABLET BY MOUTH 2 TIMES A DAY AS NEEDED FOR MUSCLE SPASMS 02/27/21   Biagio Borg, MD  Fexofenadine HCl (ALLEGRA PO) Take 180 mg by mouth daily as needed (allergies).    [provider]  glucose blood (ACCU-CHEK GUIDE) test strip Use as instructed up to 4 times daily 04/11/21   Elodia Florence., MD  Magnesium 400 MG TABS Take 400 mg by  mouth in the morning and at bedtime. 03/10/22   Hoyt Koch, MD  Metamucil Fiber CHEW Chew 3 tablets by mouth at bedtime.    [provider]  metolazone (ZAROXOLYN) 5 MG tablet Take 5 mg by mouth 2 (two) times a week. Per nephrologist    Claudia Desanctis, MD  metoprolol tartrate (LOPRESSOR) 25 MG tablet Take 25 mg by mouth 2 (two) times daily.    [provider]  NARCAN 4 MG/0.1ML LIQD nasal spray kit Place 0.4 mg into the nose daily as needed (opioid overdose). 04/25/19   [provider]  ondansetron (ZOFRAN ODT) 4 MG disintegrating tablet Take 1 tablet (4 mg total) by mouth every 4 (four) hours as needed for nausea or vomiting. 08/23/19   Charlesetta Shanks, MD  Oxycodone HCl 10 MG TABS Take 1 tablet (10 mg total) 4 (four) times daily by mouth. Per [facility] Patient taking differently: Take 10 mg by mouth every 4 (four) hours as needed (pain). Per [facility] 11/08/17   Biagio Borg, MD  pantoprazole (PROTONIX) 40 MG tablet TAKE ONE TABLET BY MOUTH DAILY 01/29/22   Biagio Borg, MD  Polyethyl Glycol-Propyl Glycol 0.4-0.3 % SOLN Apply 1 drop to eye daily as needed (dry eyes).    [provider]  polyethylene glycol powder (GLYCOLAX/MIRALAX) 17 GM/SCOOP powder Take 17 g by mouth daily as needed for mild constipation. 04/11/21   Elodia Florence., MD  potassium chloride SA (KLOR-CON M) 20 MEQ tablet 2 tablets in the morning and 1 tablet in the afternoon 04/23/22   Minus Breeding, MD  pramipexole (MIRAPEX) 0.5 MG tablet Take 1 tablet (0.5 mg total) by mouth 3 (three) times daily. 02/03/22   Biagio Borg, MD  Semaglutide, 1 MG/DOSE, 4 MG/3ML SOPN Inject 1 mg as directed once a week. 01/30/22   Biagio Borg, MD  torsemide (DEMADEX) 20 MG tablet Take 2 tablets in the morning and 1 tablet in the afternoon. Patient taking differently: 60mg  twice daily - per nephrologist 12/30/21   Minus Breeding, MD  DULoxetine (CYMBALTA) 60 MG capsule Take 60 mg by  mouth daily.    03/09/12  [provider]    Family History    Family History  Problem Relation Age of Onset   Heart disease Mother    Hypertension Mother    Diabetes Mother    Heart failure Mother    Asthma Sister    Anxiety disorder Sister    Depression Sister    Hypertension Father    Asthma Daughter    Bipolar disorder Daughter    Cancer Maternal Uncle        [place]   Cancer Other        ovarian   Liver cancer Paternal Grandmother        ????   She indicated that her mother is deceased. She indicated that her father is alive. She indicated that her sister is alive. She indicated that her brother is alive. She indicated that the status of her paternal grandmother is unknown. She indicated that the status of her daughter is unknown. She indicated that her child is alive. She indicated that the status of her maternal uncle is unknown. She indicated that the status of her other is unknown.  Social History    Social History   Socioeconomic History   Marital status: Divorced    Spouse name: Not on file   Number of children: 2   Years of education: Not on file   Highest education level: Not on file  Occupational History   Occupation: disabled  Tobacco Use   Smoking status: Former  Years: 30.00    Types: Cigarettes    Quit date: 11/08/2008    Years since quitting: 13.6   Smokeless tobacco: Never   Tobacco comments:    quit 10/09  Vaping Use   Vaping Use: Never used  Substance and Sexual Activity   Alcohol use: No    Comment: quit drinking 10/07/1997   Drug use: No   Sexual activity: Not Currently    Birth control/protection: Surgical, Abstinence  Other Topics Concern   Not on file  Social History Narrative   Patient has difficulty financially affording food, medication, and affording essential bills. She states she has limited support from her daughter.    Social Determinants of Health   Financial Resource Strain: Medium Risk (12/30/2021)   Overall  Financial Resource Strain (CARDIA)    Difficulty of Paying Living Expenses: Somewhat hard  Food Insecurity: No Food Insecurity (04/03/2022)   Hunger Vital Sign    Worried About Running Out of Food in the Last Year: Never true    Ran Out of Food in the Last Year: Never true  Transportation Needs: Unmet Transportation Needs (04/22/2022)   PRAPARE - Hydrologist (Medical): No    Lack of Transportation (Non-Medical): Yes  Physical Activity: Inactive (12/30/2021)   Exercise Vital Sign    Days of Exercise per Week: 0 days    Minutes of Exercise per Session: 0 min  Stress: No Stress Concern Present (12/30/2021)   [place]    Feeling of Stress : Not at all  Social Connections: Socially Isolated (12/30/2021)   Social Connection and Isolation Panel [NHANES]    Frequency of Communication with Friends and Family: More than three times a week    Frequency of Social Gatherings with Friends and Family: More than three times a week    Attends Religious Services: Never    Marine scientist or Organizations: No    Attends Archivist Meetings: Never    Marital Status: Never married  Intimate Partner Violence: Not At Risk (12/30/2021)   Humiliation, Afraid, Rape, and Kick questionnaire    Fear of Current or Ex-Partner: No    Emotionally Abused: No    Physically Abused: No    Sexually Abused: No     Review of Systems    General:  No chills, fever, night sweats or weight changes.  Cardiovascular:  No chest pain, dyspnea on exertion, edema, orthopnea, palpitations, paroxysmal nocturnal dyspnea. Dermatological: No rash, lesions/masses Respiratory: No cough, dyspnea Urologic: No hematuria, dysuria Abdominal:   No nausea, vomiting, diarrhea, bright red blood per rectum, melena, or hematemesis Neurologic:  No visual changes, wkns, changes in mental status. All other systems reviewed and are  otherwise negative except as noted above.  Physical Exam    VS:  BP (!) 158/60   Pulse (!) 56   Ht $R'5\' 3"'ZW$  (1.6 m)   Wt (!) 368 lb 12.8 oz (167.3 kg)   SpO2 95%   BMI 65.33 kg/m  , BMI Body mass index is 65.33 kg/m. GEN: Well nourished, well developed, in no acute distress. HEENT: normal. Neck: Supple, no JVD, carotid bruits, or masses. Cardiac: RRR, no murmurs, rubs, or gallops. No clubbing, cyanosis, +3 pitting bilateral lower extremity edema with bilateral slight ankle weeping.  Radials/DP/PT 2+ and equal bilaterally.  Respiratory:  Respirations regular and unlabored, clear to auscultation bilaterally. GI: Soft, nontender, nondistended, BS + x 4. MS: no deformity or  atrophy. Skin: warm and dry, no rash.  No signs of infection Neuro:  Strength and sensation are intact. Psych: Normal affect.  Accessory Clinical Findings    Recent Labs: 01/30/2022: TSH 3.19 03/09/2022: ALT 18; Magnesium 1.7 04/30/2022: B Natriuretic Peptide 99.0; BUN 12; Creatinine, Ser 1.09; Hemoglobin 12.1; Platelets 262; Potassium 2.7; Sodium 134   Recent Lipid Panel    Component Value Date/Time   CHOL 126 01/30/2022 1342   TRIG 120.0 01/30/2022 1342   TRIG 126 12/31/2006 0847   HDL 45.40 01/30/2022 1342   CHOLHDL 3 01/30/2022 1342   VLDL 24.0 01/30/2022 1342   LDLCALC 56 01/30/2022 1342   LDLDIRECT 127.0 01/25/2019 1423    ECG personally reviewed by me today-none today.    Assessment & Plan   1.  Acute on chronic diastolic CHF-weight today 368.8.  +3 bilateral lower extremity pitting edema with slight weeping bilateral ankles.  Reviewed importance of fluid restriction and low-sodium diet. Continue metoprolol, losartan, potassium, torsemide Increase torsemide to 80 mg in a.m. and 40 mg in p.m. x3 days-then resume normal dosing Metolazone Tuesday Thursday Saturday-then resume normal dosing Potassium 80 mill equivalents in the a.m. and 40 mill equivalents in the afternoon then resume normal  dosing BMP in 1 week Heart healthy low-sodium diet-salty 6 given Increase physical activity as tolerated Fluid restriction 48-56 ounces or less Maintain weight log Elevate lower extremities when not active  Essential hypertension-BP today 158/60.  Expect improvement with diuresis.   Continue metoprolol, losartan Heart healthy low-sodium diet Increase physical activity as tolerated  Hyperlipidemia-LDL 56 on 01/30/2022 Heart healthy low-sodium high-fiber diet Follows with PCP  Type 2 diabetes-A1c 6.9 on 01/30/2022 Continue metformin Heart healthy low-sodium diet-salty 6 given Increase physical activity as tolerated Follows with PCP   Disposition: Follow-up with Dr. Percival Spanish or me in 2 weeks   Jossie Ng. Estiben Mizuno NP-C    06/22/2022, 3:07 PM [facility] [place] [address] Office [phone] Fax [phone]  Notice: This dictation was prepared with Dragon dictation along with smaller phrase technology. Any transcriptional errors that result from this process are unintentional and may not be corrected upon review.  I spent 14 minutes examining this patient, reviewing medications, and using patient centered shared decision making involving her cardiac care.  Prior to her visit I spent greater than 20 minutes reviewing her past medical history,  medications, and prior cardiac tests.

## 2022-06-22 ENCOUNTER — Encounter: Payer: Self-pay | Admitting: General Practice

## 2022-06-22 ENCOUNTER — Ambulatory Visit (INDEPENDENT_AMBULATORY_CARE_PROVIDER_SITE_OTHER): Payer: Medicare Other | Admitting: General Practice

## 2022-06-22 VITALS — BP 158/60 | HR 56 | Ht 63.0 in | Wt 368.8 lb

## 2022-06-22 DIAGNOSIS — E785 Hyperlipidemia, unspecified: Secondary | ICD-10-CM | POA: Diagnosis not present

## 2022-06-22 DIAGNOSIS — E118 Type 2 diabetes mellitus with unspecified complications: Secondary | ICD-10-CM

## 2022-06-22 DIAGNOSIS — I10 Essential (primary) hypertension: Secondary | ICD-10-CM

## 2022-06-22 DIAGNOSIS — I5033 Acute on chronic diastolic (congestive) heart failure: Secondary | ICD-10-CM

## 2022-06-22 NOTE — Patient Instructions (Addendum)
Medication Instructions:  TAKE POTASSIUM 80MeQ-AM AND '40MG'$ -PM  Torsemide to 80 mg in a.m. and 40 mg in p.m. x3 days-then resume normal dosing  Metolazone Tuesday Thursday Saturday-then resume normal dosing  *If you need a refill on your cardiac medications before your next appointment, please call your pharmacy*  Lab Work:     BMET IN [place].  FLUID RESTRICTION 48-56 OUNCES OR LESS DAILY  SUGAR FREE CANDY OR GUM ONLY  MAY USE BIOTENE  ELEVATE LEGS WHILE SITTING  If you have labs (blood work) drawn today and your tests are completely normal, you will receive your results only by: [place] (if you have MyChart) OR  A paper copy in the mail If you have any lab test that is abnormal or we need to change your treatment, we will call you to review the results.  Special Instructions PLEASE READ AND FOLLOW SALTY 6-ATTACHED-1,'500mg'$  daily  Follow-Up: Your next appointment:  2 week(s) In Person with Coletta Memos, FNP     At [facility], you and your health needs are our priority.  As part of our continuing mission to provide you with exceptional heart care, we have created designated Provider Care Teams.  These Care Teams include your primary Cardiologist (physician) and Advanced Practice Providers (APPs -  Physician Assistants and Nurse Practitioners) who all work together to provide you with the care you need, when you need it.  Important Information About Sugar             6 SALTY THINGS TO AVOID     1,'500MG'$  DAILY

## 2022-06-24 ENCOUNTER — Other Ambulatory Visit: Payer: Self-pay | Admitting: Internal Medicine

## 2022-06-26 ENCOUNTER — Telehealth: Payer: Self-pay | Admitting: Cardiology

## 2022-06-26 DIAGNOSIS — E876 Hypokalemia: Secondary | ICD-10-CM

## 2022-06-26 DIAGNOSIS — Z79899 Other long term (current) drug therapy: Secondary | ICD-10-CM

## 2022-06-26 NOTE — Telephone Encounter (Signed)
Lattie Haw from [facility] states that she received a call from [place] to get lab work done on pt and called back with the results. She states potassium - 3.1 and creatinine 1.41. She states that she will fax over the results today.

## 2022-06-29 NOTE — Telephone Encounter (Signed)
H[place] Cloud is ordered to be done today. Lab slip given to pt at discharge with AVS. Left detailed message on VM of Lattie Haw from [facility].

## 2022-06-30 ENCOUNTER — Telehealth: Payer: Self-pay | Admitting: Licensed Clinical Social Worker

## 2022-06-30 NOTE — Telephone Encounter (Signed)
H&V Care Navigation CSW Progress Note  Clinical Social Worker  contacted [employer]  to f/u on application mailed last week. Pt portion received and submitted w/ provider portion. No answer on [employer] eligibility line. Will re-attempt again in 48 hrs if no response.  Patient is participating in a Managed Medicaid Plan:  No.  SDOH Screenings   Alcohol Screen: Low Risk  (12/30/2021)   Alcohol Screen    Last Alcohol Screening Score (AUDIT): 0  Depression (PHQ2-9): Medium Risk (01/30/2022)   Depression (PHQ2-9)    PHQ-2 Score: 9  Financial Resource Strain: Medium Risk (12/30/2021)   Overall Financial Resource Strain (CARDIA)    Difficulty of Paying Living Expenses: Somewhat hard  Food Insecurity: No Food Insecurity (04/03/2022)   Hunger Vital Sign    Worried About Running Out of Food in the Last Year: Never true    Ran Out of Food in the Last Year: Never true  Housing: Low Risk  (04/03/2022)   Housing    Last Housing Risk Score: 0  Physical Activity: Inactive (12/30/2021)   Exercise Vital Sign    Days of Exercise per Week: 0 days    Minutes of Exercise per Session: 0 min  Social Connections: Socially Isolated (12/30/2021)   Social Connection and Isolation Panel [NHANES]    Frequency of Communication with Friends and Family: More than three times a week    Frequency of Social Gatherings with Friends and Family: More than three times a week    Attends Religious Services: Never    Marine scientist or Organizations: No    Attends Archivist Meetings: Never    Marital Status: Never married  Stress: No Stress Concern Present (12/30/2021)   [employer] [place] of Stress : Not at all  Tobacco Use: Medium Risk (06/22/2022)   Patient History    Smoking Tobacco Use: Former    Smokeless Tobacco Use: Never    Passive Exposure: Not on file  Transportation Needs: Unmet Transportation Needs (04/22/2022)   PRAPARE -  Hydrologist (Medical): No    Lack of Transportation (Non-Medical): Yes   Westley Hummer, MSW, [place]  [phone]- work cell phone (preferred) [phone]- desk phone

## 2022-06-30 NOTE — Telephone Encounter (Signed)
Lattie Haw from [facility] is calling to see if patient has been called since her potassium was low.   Please call Lattie Haw to let her know what instructions should been given to the patient about her potassium, Lattie Haw can be reached at [phone].

## 2022-06-30 NOTE — Telephone Encounter (Signed)
Spoke to patient she stated she has been taking Klor Con 20 meq 4 tablets in am and 2 tablets in pm.Advised I will send message to Coletta Memos NP for advice

## 2022-06-30 NOTE — Telephone Encounter (Signed)
Spoke to [place] with [facility].Stated patient's potassium on 7/6  3.1.Stated she took a extra potassium 20 meq this past Sunday 7/9.She has lost 15 lbs.She is asymptomatic.Stated she faxed lab results to office.Advised I will send message to Coletta Memos NP for advice.

## 2022-06-30 NOTE — Telephone Encounter (Signed)
Please have her increase her potassium to 40 mill equivalents in the morning and 40 mill equivalents in the afternoon.  Please have her keep her follow-up appointment as scheduled.  Please have her have a BMP drawn in the next 2 to 3 days.  Thank you.

## 2022-07-01 NOTE — Telephone Encounter (Signed)
Called pt she states that she is already taking 80Meq-AM and 40Meq-PM.  Discussed K+ with Denyse Amass, he states to take 80Meq-AM and 60Meq-PM with non-fasting lab on Friday.  Called [facility] [phone]) she states to fax over lab orders to [phone] she thinks that she can have this done on Friday. She will call if not available to discuss. She will call pt to set up a time BMET order-signed entered and faxed to [facility]

## 2022-07-02 ENCOUNTER — Telehealth: Payer: Self-pay | Admitting: Licensed Clinical Social Worker

## 2022-07-02 NOTE — Telephone Encounter (Signed)
H&V Care Navigation CSW Progress Note  Clinical Social Worker  contacted [place] by phone  to f/u on applications sent. Emailed eligibility office, had not heard back from my call this week. Received a call back from [place], who confirms that she has received the applications and will reach out to patient and our office if anything is missing. I remain available.   Patient is participating in a Managed Medicaid Plan:  No, U[employer] Medicare  SDOH Screenings   Alcohol Screen: Low Risk  (12/30/2021)   Alcohol Screen    Last Alcohol Screening Score (AUDIT): 0  Depression (PHQ2-9): Medium Risk (01/30/2022)   Depression (PHQ2-9)    PHQ-2 Score: 9  Financial Resource Strain: Medium Risk (12/30/2021)   Overall Financial Resource Strain (CARDIA)    Difficulty of Paying Living Expenses: Somewhat hard  Food Insecurity: No Food Insecurity (04/03/2022)   Hunger Vital Sign    Worried About Running Out of Food in the Last Year: Never true    Ran Out of Food in the Last Year: Never true  Housing: Low Risk  (04/03/2022)   Housing    Last Housing Risk Score: 0  Physical Activity: Inactive (12/30/2021)   Exercise Vital Sign    Days of Exercise per Week: 0 days    Minutes of Exercise per Session: 0 min  Social Connections: Socially Isolated (12/30/2021)   Social Connection and Isolation Panel [NHANES]    Frequency of Communication with Friends and Family: More than three times a week    Frequency of Social Gatherings with Friends and Family: More than three times a week    Attends Religious Services: Never    Marine scientist or Organizations: No    Attends Archivist Meetings: Never    Marital Status: Never married  Stress: No Stress Concern Present (12/30/2021)   [employer] [place] of Stress : Not at all  Tobacco Use: Medium Risk (06/22/2022)   Patient History    Smoking Tobacco Use: Former    Smokeless Tobacco Use:  Never    Passive Exposure: Not on file  Transportation Needs: Unmet Transportation Needs (04/22/2022)   PRAPARE - Hydrologist (Medical): No    Lack of Transportation (Non-Medical): Yes   Westley Hummer, MSW, [place]  [phone]- work cell phone (preferred) [phone]- desk phone

## 2022-07-03 ENCOUNTER — Telehealth: Payer: Self-pay | Admitting: Cardiology

## 2022-07-03 NOTE — Progress Notes (Deleted)
Cardiology Clinic Note   Patient Name: Qunisha Bryk Bialy Date of Encounter: 07/03/2022  Primary Care Provider:  Biagio Borg, MD Primary Cardiologist:  Minus Breeding, MD  Patient Profile    Shacoria Latif Scherr 66 year old female presents to clinic today for follow-up evaluation of her chronic diastolic CHF.  Past Medical History    Past Medical History:  Diagnosis Date   Acute lymphadenitis 2011   ALLERGIC RHINITIS 08/10/2007   ANXIETY 12/06/2007   Atherosclerotic peripheral vascular disease ([place]) 06/13/2013   Aorta on CT June 2014   Cellulitis 2011   Cervical disc disease 03/09/2012   CHF (congestive heart failure) ([place])    Chronic pain 03/09/2012   DEPRESSION 12/06/2007   Diabetes ([place])    DIVERTICULOSIS, [place] 12/06/2007   GERD 12/06/2007   Hepatitis    age 54 hepatitis A   HLD (hyperlipidemia) 05/24/2019   HYPERTENSION 12/06/2007   Lumbar disc disease 03/09/2012   Mesenteric adenitis    MRSA 2006   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 2010   [place]  may 24th 2010   COLONOSCOPY WITH PROPOFOL N/A 07/10/2016   Procedure: COLONOSCOPY WITH PROPOFOL;  Surgeon: Manus Gunning, MD;  Location: [facility];  Service: Gastroenterology;  Laterality: N/A;   COLONOSCOPY WITH PROPOFOL N/A 09/26/2019   Procedure: COLONOSCOPY WITH PROPOFOL;  Surgeon: Yetta Flock, MD;  Location: [facility];  Service: Gastroenterology;  Laterality: N/A;   ESOPHAGOGASTRODUODENOSCOPY (EGD) WITH PROPOFOL N/A 07/10/2016   Procedure: ESOPHAGOGASTRODUODENOSCOPY (EGD) WITH PROPOFOL;  Surgeon: Manus Gunning, MD;  Location: [facility];  Service: Gastroenterology;  Laterality: N/A;   POLYPECTOMY  09/26/2019   Procedure: POLYPECTOMY;  Surgeon: Yetta Flock,  MD;  Location: [facility];  Service: Gastroenterology;;   s/p ovary cyst     s/p right knee arthroscopy     Dr. Mardelle Matte ortho    Allergies  Allergies  Allergen Reactions   Amoxicillin-Pot Clavulanate Nausea And Vomiting    Projectile vomiting Did it involve swelling of the face/tongue/throat, SOB, or low BP? No Did it involve sudden or severe rash/hives, skin peeling, or any reaction on the inside of your mouth or nose? No Did you need to seek medical attention at a hospital or doctor's office? No When did it last happen?      20-30 years If all above answers are "NO", may proceed with cephalosporin use.    Adhesive [Tape] Other (See Comments)    Tears skin off - use paper tape    Codeine Other (See Comments)    hallucinations   Crestor [Rosuvastatin Calcium]     Severe muscle cramps   Morphine And Related Nausea And Vomiting and Other (See Comments)    Migraine headaches   Vicodin [Hydrocodone-Acetaminophen] Other (See Comments)    hallucinations   Latex Rash    History of Present Illness    Janequa A Harbach has a PMH of hyperlipidemia, hypertension, GERD, anxiety, depression, type 2 diabetes, and CHF.  Echocardiogram showed an EF of 67-61% with diastolic dysfunction 9/50/9326.  She presented to the emergency department 08/10/2021 with increased shortness of breath.  She noted a 13 pound weight gain over the previous 7 days with worsening lower extremity edema.  She did note PND and orthopnea.  She  was unable to lay flat and was sleeping in her recliner.  She reported taking torsemide as prescribed.  She denied chest discomfort.  She also denied infectious symptoms aside from increased shortness of breath and increased urinary frequency due to her diuretic.  She did note mild dysuria but denied fever and chills.  Her blood pressure at that time was 123/76 with a pulse of 66.  Her creatinine was 1.05.  Her EKG showed normal sinus rhythm.  She received IV furosemide.  Her chest x-ray  showed persistent cardiac enlargement and central pulmonary vascular engorgement with no change from prior chest x-rays, no signs of lobar consolidation, pleural effusion, or frank edema.  Her BNP at that time was 22.  Troponins were normal.  It was also felt that she had a early UTI.  She was also prescribed Keflex.  Her CTA was negative for acute PE with no evidence of pneumo, pleural effusion, pericardial effusion, aortic dissection or focal consolidation.  Social work evaluated and recommended home physical therapy, prescription for walker.  It was also recommended that she use lower extremity support stockings and elevate her lower extremities to assist with her lower extremity edema.  She was discharged in stable condition on 08/11/2021.   Due to her limits and mobility she was unable to present to the clinic 08/18/2021.  She  was contacted via phone and stated she was frustrated with her lower extremity swelling.  She reported that she had ordered lower extremity support stockings and was excited to give them a try.  She reported that she did not add salt to her food.  She had been consuming about 4 bottles of water per day along with tea.  We reviewed the importance of selecting food options that are low/no salt and fluid restriction.  I recommended that she drink no more than about 48 ounces per day.  She had been taking her torsemide at about 9 or 10:00 and then again at 5 or 6:00 in the evening.  She had not been sleeping well due to frequent urination.  We discussed taking her medication earlier in the day.  I have encouraged her to continue to weigh herself daily and keep a log, Igave her the salty 6 diet sheet, gave parameters for fluid restriction, and asked her to follow-up in 3 months and as needed.  She was seen in follow-up by Almyra Deforest, PA-C on 09/09/2021.  She took 2.5 mg of metolazone and did not notice significant urine output.  She was still taking torsemide 60 mg twice daily.  Her weight at  home was around 367 pounds.  Her weight diary was reviewed and her weight was ranging between 367 pounds up to 375 pounds.  On physical exam she did not have significant lower extremity edema.  Her lung sounds were noted to be clear.  She was euvolemic on exam.  Her medication was continued.  Her BMP showed a creatinine of 1.18 and a potassium of 3.6.   She was seen in follow-up by Dr. Percival Spanish on 04/21/2022.  During that time her weight was around 356 pounds.  She reported she felt like she was carrying more weight in her legs.  She felt like she was having some shortness of breath with ambulation.  She was ambulating with a walker.  She was not having any new PND or orthopnea.  She was noted to have had a fall which resulted in her hitting her head.  She did not want to  go to the emergency department.  She was noted to have some shoulder pain related to her fall.  She denied chest pressure, arm and neck discomfort.  She was not having any new palpitations.  She denied presyncope and syncope.  Her heart rate was noted to be 58.  Her Zaroxolyn was increased to daily for 5 days along with extra potassium.  Salt and fluid restriction were reviewed.  Her BMP on 04/30/2022 showed a potassium of 2.7 and a creatinine of 1.09.  Her follow-up lab work 05/04/2022 was stable.  She presented to the clinic 07/09/2022 for follow-up evaluation stated she had been having difficulty keeping her fluid off.  Her weight today was 368 pounds.  We reviewed the importance of fluid restriction and sodium restriction.  She expressed understanding.  She reported compliance with her medications.  She did mention that she had been drinking around 90 ounces per day.  She tried to elevate her lower extremities when she was not active.  She reported that the last time she was sent to the emergency department the emergency department physician was not nice to her.  I  doubled her diuresis, asked her take metolazone 3 times this week, ordered a  bmet in 1 week, and plan follow-up in 2 weeks.  She presents to clinic today for follow-up evaluation states***  Today she denies chest pain, shortness of breath, fatigue, palpitations, melena, hematuria, hemoptysis, diaphoresis, weakness, presyncope, syncope, orthopnea, and PND.    Home Medications    Prior to Admission medications   Medication Sig Start Date End Date Taking? Authorizing Provider  Accu-Chek Softclix Lancets lancets Use as directed up to 4 times daily Patient taking differently: 1 each by Other route in the morning, at noon, in the evening, and at bedtime. 04/11/21   Elodia Florence., MD  acetaminophen (TYLENOL) 325 MG tablet Take 650 mg by mouth every 6 (six) hours as needed for headache or fever (pain).    [provider]  albuterol (PROAIR HFA) 108 (90 Base) MCG/ACT inhaler Inhale 2 puffs into the lungs every 6 (six) hours as needed for wheezing or shortness of breath. 11/05/20   Lucretia Kern, DO  Blood Glucose Monitoring Suppl (ACCU-CHEK GUIDE) w/Device KIT Use as directed Patient taking differently: 1 each by Other route as directed. 04/11/21   Elodia Florence., MD  cholecalciferol (VITAMIN D3) 25 MCG (1000 UNIT) tablet Take 2,000 Units by mouth daily.    [provider]  citalopram (CELEXA) 20 MG tablet Take 1 tablet (20 mg total) by mouth daily. 01/30/22   Biagio Borg, MD  clonazePAM (KLONOPIN) 1 MG tablet TAKE ONE TABLET BY MOUTH TWICE DAILY AS NEEDED FOR ANXIETY 06/04/22   Biagio Borg, MD  cyclobenzaprine (FLEXERIL) 10 MG tablet TAKE 1 TABLET BY MOUTH 2 TIMES A DAY AS NEEDED FOR MUSCLE SPASMS Patient taking differently: Take 10 mg by mouth daily as needed. TAKE 1 TABLET BY MOUTH 2 TIMES A DAY AS NEEDED FOR MUSCLE SPASMS 02/27/21   Biagio Borg, MD  Fexofenadine HCl (ALLEGRA PO) Take 180 mg by mouth daily as needed (allergies).    [provider]  glucose blood (ACCU-CHEK GUIDE) test strip Use as instructed up to 4 times daily  04/11/21   Elodia Florence., MD  Magnesium 400 MG TABS Take 400 mg by mouth in the morning and at bedtime. 03/10/22   Hoyt Koch, MD  Metamucil Fiber CHEW Chew 3 tablets by  mouth at bedtime.    [provider]  metolazone (ZAROXOLYN) 5 MG tablet Take 5 mg by mouth 2 (two) times a week. Per nephrologist    Claudia Desanctis, MD  metoprolol tartrate (LOPRESSOR) 25 MG tablet Take 25 mg by mouth 2 (two) times daily.    [provider]  NARCAN 4 MG/0.1ML LIQD nasal spray kit Place 0.4 mg into the nose daily as needed (opioid overdose). 04/25/19   [provider]  ondansetron (ZOFRAN ODT) 4 MG disintegrating tablet Take 1 tablet (4 mg total) by mouth every 4 (four) hours as needed for nausea or vomiting. 08/23/19   Charlesetta Shanks, MD  Oxycodone HCl 10 MG TABS Take 1 tablet (10 mg total) 4 (four) times daily by mouth. Per [facility] Patient taking differently: Take 10 mg by mouth every 4 (four) hours as needed (pain). Per [facility] 11/08/17   Biagio Borg, MD  pantoprazole (PROTONIX) 40 MG tablet TAKE ONE TABLET BY MOUTH DAILY 01/29/22   Biagio Borg, MD  Polyethyl Glycol-Propyl Glycol 0.4-0.3 % SOLN Apply 1 drop to eye daily as needed (dry eyes).    [provider]  polyethylene glycol powder (GLYCOLAX/MIRALAX) 17 GM/SCOOP powder Take 17 g by mouth daily as needed for mild constipation. 04/11/21   Elodia Florence., MD  potassium chloride SA (KLOR-CON M) 20 MEQ tablet 2 tablets in the morning and 1 tablet in the afternoon 04/23/22   Minus Breeding, MD  pramipexole (MIRAPEX) 0.5 MG tablet Take 1 tablet (0.5 mg total) by mouth 3 (three) times daily. 02/03/22   Biagio Borg, MD  Semaglutide, 1 MG/DOSE, 4 MG/3ML SOPN Inject 1 mg as directed once a week. 01/30/22   Biagio Borg, MD  torsemide (DEMADEX) 20 MG tablet Take 2 tablets in the morning and 1 tablet in the afternoon. Patient taking differently: 79m twice daily - per nephrologist  12/30/21   HMinus Breeding MD  DULoxetine (CYMBALTA) 60 MG capsule Take 60 mg by mouth daily.    03/09/12  [provider]    Family History    Family History  Problem Relation Age of Onset   Heart disease Mother    Hypertension Mother    Diabetes Mother    Heart failure Mother    Asthma Sister    Anxiety disorder Sister    Depression Sister    Hypertension Father    Asthma Daughter    Bipolar disorder Daughter    Cancer Maternal Uncle        [place]   Cancer Other        ovarian   Liver cancer Paternal Grandmother        ????   She indicated that her mother is deceased. She indicated that her father is alive. She indicated that her sister is alive. She indicated that her brother is alive. She indicated that the status of her paternal grandmother is unknown. She indicated that the status of her daughter is unknown. She indicated that her child is alive. She indicated that the status of her maternal uncle is unknown. She indicated that the status of her other is unknown.  Social History    Social History   Socioeconomic History   Marital status: Divorced    Spouse name: Not on file   Number of children: 2   Years of education: Not on file   Highest education level: Not on file  Occupational History   Occupation: disabled  Tobacco Use   Smoking status: Former    Years: 30.00    Types: Cigarettes    Quit date: 11/08/2008    Years since quitting: 13.6   Smokeless tobacco: Never   Tobacco comments:    quit 10/09  Vaping Use   Vaping Use: Never used  Substance and Sexual Activity   Alcohol use: No    Comment: quit drinking 10/07/1997   Drug use: No   Sexual activity: Not Currently    Birth control/protection: Surgical, Abstinence  Other Topics Concern   Not on file  Social History Narrative   Patient has difficulty financially affording food, medication, and affording essential bills. She states she has limited support from her daughter.    Social  Determinants of Health   Financial Resource Strain: Medium Risk (12/30/2021)   Overall Financial Resource Strain (CARDIA)    Difficulty of Paying Living Expenses: Somewhat hard  Food Insecurity: No Food Insecurity (04/03/2022)   Hunger Vital Sign    Worried About Running Out of Food in the Last Year: Never true    Ran Out of Food in the Last Year: Never true  Transportation Needs: Unmet Transportation Needs (04/22/2022)   PRAPARE - Hydrologist (Medical): No    Lack of Transportation (Non-Medical): Yes  Physical Activity: Inactive (12/30/2021)   Exercise Vital Sign    Days of Exercise per Week: 0 days    Minutes of Exercise per Session: 0 min  Stress: No Stress Concern Present (12/30/2021)   [place]    Feeling of Stress : Not at all  Social Connections: Socially Isolated (12/30/2021)   Social Connection and Isolation Panel [NHANES]    Frequency of Communication with Friends and Family: More than three times a week    Frequency of Social Gatherings with Friends and Family: More than three times a week    Attends Religious Services: Never    Marine scientist or Organizations: No    Attends Archivist Meetings: Never    Marital Status: Never married  Intimate Partner Violence: Not At Risk (12/30/2021)   Humiliation, Afraid, Rape, and Kick questionnaire    Fear of Current or Ex-Partner: No    Emotionally Abused: No    Physically Abused: No    Sexually Abused: No     Review of Systems    General:  No chills, fever, night sweats or weight changes.  Cardiovascular:  No chest pain, dyspnea on exertion, edema, orthopnea, palpitations, paroxysmal nocturnal dyspnea. Dermatological: No rash, lesions/masses Respiratory: No cough, dyspnea Urologic: No hematuria, dysuria Abdominal:   No nausea, vomiting, diarrhea, bright red blood per rectum, melena, or hematemesis Neurologic:  No  visual changes, wkns, changes in mental status. All other systems reviewed and are otherwise negative except as noted above.  Physical Exam    VS:  There were no vitals taken for this visit. , BMI There is no height or weight on file to calculate BMI. GEN: Well nourished, well developed, in no acute distress. HEENT: normal. Neck: Supple, no JVD, carotid bruits, or masses. Cardiac: RRR, no murmurs, rubs, or gallops. No clubbing, cyanosis, +3 pitting bilateral lower extremity edema with bilateral slight ankle weeping.  Radials/DP/PT 2+ and equal bilaterally.  Respiratory:  Respirations regular and unlabored, clear to auscultation bilaterally. GI: Soft, nontender, nondistended, BS + x 4. MS: no deformity or atrophy. Skin: warm and dry, no rash.  No signs of infection  Neuro:  Strength and sensation are intact. Psych: Normal affect.  Accessory Clinical Findings    Recent Labs: 01/30/2022: TSH 3.19 03/09/2022: ALT 18; Magnesium 1.7 04/30/2022: B Natriuretic Peptide 99.0; BUN 12; Creatinine, Ser 1.09; Hemoglobin 12.1; Platelets 262; Potassium 2.7; Sodium 134   Recent Lipid Panel    Component Value Date/Time   CHOL 126 01/30/2022 1342   TRIG 120.0 01/30/2022 1342   TRIG 126 12/31/2006 0847   HDL 45.40 01/30/2022 1342   CHOLHDL 3 01/30/2022 1342   VLDL 24.0 01/30/2022 1342   LDLCALC 56 01/30/2022 1342   LDLDIRECT 127.0 01/25/2019 1423    ECG personally reviewed by me today-none today.  Echocardiogram 09/20/2021 IMPRESSIONS     1. Left ventricular ejection fraction, by estimation, is 65 to 70%. The  left ventricle has normal function. The left ventricle has no regional  wall motion abnormalities. Left ventricular diastolic parameters are  consistent with Grade II diastolic  dysfunction (pseudonormalization). Elevated left ventricular end-diastolic  pressure.   2. Right ventricular systolic function is normal. The right ventricular  size is normal. Tricuspid regurgitation signal is  inadequate for assessing  PA pressure.   3. Left atrial size was upper normal.   4. The mitral valve is grossly normal. No evidence of mitral valve  regurgitation.   5. The aortic valve is tricuspid. Aortic valve regurgitation is not  visualized.   6. The inferior vena cava is dilated in size with >50% respiratory  variability, suggesting right atrial pressure of 8 mmHg.   Comparison(s): Prior images reviewed side by side. LVEF vigorous with  probable increased LVEDP.  Assessment & Plan   1.  Acute on chronic diastolic CHF-weight today ***368.8.  1+ bilateral lower extremity pitting edema.  Again reviewed importance of fluid restriction and low-sodium diet. Continue metoprolol, losartan, potassium, torsemide, metolazone Heart healthy low-sodium diet-salty 6 given Increase physical activity as tolerated Fluid restriction 48-56 ounces or less Maintain weight log Elevate lower extremities when not active  Essential hypertension-BP today ***158/60.  Well-controlled post diuresis.  Continue metoprolol, losartan Heart healthy low-sodium diet Increase physical activity as tolerated  Hyperlipidemia-LDL 56 on 01/30/2022 Heart healthy low-sodium high-fiber diet Follows with PCP  Type 2 diabetes-A1c 6.9 on 01/30/2022 Continue metformin Heart healthy low-sodium diet-salty 6 given Increase physical activity as tolerated Follows with PCP   Disposition: Follow-up with Dr. Percival Spanish or me in 2-3   Jossie Ng. Arliene Rosenow NP-C    07/03/2022, 7:24 AM [place] [address] Office [phone] Fax [phone]  Notice: This dictation was prepared with Dragon dictation along with smaller phrase technology. Any transcriptional errors that result from this process are unintentional and may not be corrected upon review.  I spent 14*** minutes examining this patient, reviewing medications, and using patient centered shared decision making involving her cardiac  care.  Prior to her visit I spent greater than 20 minutes reviewing her past medical history,  medications, and prior cardiac tests.

## 2022-07-03 NOTE — Telephone Encounter (Signed)
Pt had to reschedule her appt 07/06/22 with Coletta Memos, NP because transportation needs 72 hour advance notice. She rescheduled for 08/06/22, however she wants to know if she can schedule a televisit, sooner with Coletta Memos, NP. Please advise.

## 2022-07-04 LAB — BASIC METABOLIC PANEL
BUN/Creatinine Ratio: 24 (ref 12–28)
BUN: 40 mg/dL — ABNORMAL HIGH (ref 8–27)
CO2: 30 mmol/L — ABNORMAL HIGH (ref 20–29)
Calcium: 9.3 mg/dL (ref 8.7–10.3)
Chloride: 88 mmol/L — ABNORMAL LOW (ref 96–106)
Creatinine, Ser: 1.65 mg/dL — ABNORMAL HIGH (ref 0.57–1.00)
Glucose: 161 mg/dL — ABNORMAL HIGH (ref 70–99)
Potassium: 2.9 mmol/L — CL (ref 3.5–5.2)
Sodium: 141 mmol/L (ref 134–144)
eGFR: 34 mL/min/{1.73_m2} — ABNORMAL LOW (ref >59)

## 2022-07-06 ENCOUNTER — Other Ambulatory Visit: Payer: Self-pay

## 2022-07-06 ENCOUNTER — Ambulatory Visit: Payer: Medicare Other | Admitting: General Practice

## 2022-07-06 NOTE — Telephone Encounter (Signed)
Called pt and scheduled a sooner appt for Mon 7-24 she states that her legs are still swollen and weeping. No sooner appts

## 2022-07-11 NOTE — Progress Notes (Deleted)
Cardiology Clinic Note   Patient Name: Aamani Moose Buenger Date of Encounter: 07/11/2022  Primary Care Provider:  Biagio Borg, MD Primary Cardiologist:  Minus Breeding, MD  Patient Profile    Kimley Apsey Ishibashi 66 year old female presents to clinic today for follow-up evaluation of her chronic diastolic CHF.  Past Medical History    Past Medical History:  Diagnosis Date   Acute lymphadenitis 2011   ALLERGIC RHINITIS 08/10/2007   ANXIETY 12/06/2007   Atherosclerotic peripheral vascular disease ([place]) 06/13/2013   Aorta on CT June 2014   Cellulitis 2011   Cervical disc disease 03/09/2012   CHF (congestive heart failure) ([place])    Chronic pain 03/09/2012   DEPRESSION 12/06/2007   Diabetes ([place])    DIVERTICULOSIS, [place] 12/06/2007   GERD 12/06/2007   Hepatitis    age 16 hepatitis A   HLD (hyperlipidemia) 05/24/2019   HYPERTENSION 12/06/2007   Lumbar disc disease 03/09/2012   Mesenteric adenitis    MRSA 2006   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 2010   [place]  may 24th 2010   COLONOSCOPY WITH PROPOFOL N/A 07/10/2016   Procedure: COLONOSCOPY WITH PROPOFOL;  Surgeon: Manus Gunning, MD;  Location: [facility];  Service: Gastroenterology;  Laterality: N/A;   COLONOSCOPY WITH PROPOFOL N/A 09/26/2019   Procedure: COLONOSCOPY WITH PROPOFOL;  Surgeon: Yetta Flock, MD;  Location: [facility];  Service: Gastroenterology;  Laterality: N/A;   ESOPHAGOGASTRODUODENOSCOPY (EGD) WITH PROPOFOL N/A 07/10/2016   Procedure: ESOPHAGOGASTRODUODENOSCOPY (EGD) WITH PROPOFOL;  Surgeon: Manus Gunning, MD;  Location: [facility];  Service: Gastroenterology;  Laterality: N/A;   POLYPECTOMY  09/26/2019   Procedure: POLYPECTOMY;  Surgeon: Yetta Flock,  MD;  Location: [facility];  Service: Gastroenterology;;   s/p ovary cyst     s/p right knee arthroscopy     Dr. Mardelle Matte ortho    Allergies  Allergies  Allergen Reactions   Amoxicillin-Pot Clavulanate Nausea And Vomiting    Projectile vomiting Did it involve swelling of the face/tongue/throat, SOB, or low BP? No Did it involve sudden or severe rash/hives, skin peeling, or any reaction on the inside of your mouth or nose? No Did you need to seek medical attention at a hospital or doctor's office? No When did it last happen?      20-30 years If all above answers are "NO", may proceed with cephalosporin use.    Adhesive [Tape] Other (See Comments)    Tears skin off - use paper tape    Codeine Other (See Comments)    hallucinations   Crestor [Rosuvastatin Calcium]     Severe muscle cramps   Morphine And Related Nausea And Vomiting and Other (See Comments)    Migraine headaches   Vicodin [Hydrocodone-Acetaminophen] Other (See Comments)    hallucinations   Latex Rash    History of Present Illness    Deone A Joaquin has a PMH of hyperlipidemia, hypertension, GERD, anxiety, depression, type 2 diabetes, and CHF.  Echocardiogram showed an EF of 88-28% with diastolic dysfunction 0/02/4916.  She presented to the emergency department 08/10/2021 with increased shortness of breath.  She noted a 13 pound weight gain over the previous 7 days with worsening lower extremity edema.  She did note PND and orthopnea.  She  was unable to lay flat and was sleeping in her recliner.  She reported taking torsemide as prescribed.  She denied chest discomfort.  She also denied infectious symptoms aside from increased shortness of breath and increased urinary frequency due to her diuretic.  She did note mild dysuria but denied fever and chills.  Her blood pressure at that time was 123/76 with a pulse of 66.  Her creatinine was 1.05.  Her EKG showed normal sinus rhythm.  She received IV furosemide.  Her chest x-ray  showed persistent cardiac enlargement and central pulmonary vascular engorgement with no change from prior chest x-rays, no signs of lobar consolidation, pleural effusion, or frank edema.  Her BNP at that time was 22.  Troponins were normal.  It was also felt that she had a early UTI.  She was also prescribed Keflex.  Her CTA was negative for acute PE with no evidence of pneumo, pleural effusion, pericardial effusion, aortic dissection or focal consolidation.  Social work evaluated and recommended home physical therapy, prescription for walker.  It was also recommended that she use lower extremity support stockings and elevate her lower extremities to assist with her lower extremity edema.  She was discharged in stable condition on 08/11/2021.   Due to her limits and mobility she was unable to present to the clinic 08/18/2021.  She  was contacted via phone and stated she was frustrated with her lower extremity swelling.  She reported that she had ordered lower extremity support stockings and was excited to give them a try.  She reported that she did not add salt to her food.  She had been consuming about 4 bottles of water per day along with tea.  We reviewed the importance of selecting food options that are low/no salt and fluid restriction.  I recommended that she drink no more than about 48 ounces per day.  She had been taking her torsemide at about 9 or 10:00 and then again at 5 or 6:00 in the evening.  She had not been sleeping well due to frequent urination.  We discussed taking her medication earlier in the day.  I have encouraged her to continue to weigh herself daily and keep a log, Igave her the salty 6 diet sheet, gave parameters for fluid restriction, and asked her to follow-up in 3 months and as needed.  She was seen in follow-up by Almyra Deforest, PA-C on 09/09/2021.  She took 2.5 mg of metolazone and did not notice significant urine output.  She was still taking torsemide 60 mg twice daily.  Her weight at  home was around 367 pounds.  Her weight diary was reviewed and her weight was ranging between 367 pounds up to 375 pounds.  On physical exam she did not have significant lower extremity edema.  Her lung sounds were noted to be clear.  She was euvolemic on exam.  Her medication was continued.  Her BMP showed a creatinine of 1.18 and a potassium of 3.6.   She was seen in follow-up by Dr. Percival Spanish on 04/21/2022.  During that time her weight was around 356 pounds.  She reported she felt like she was carrying more weight in her legs.  She felt like she was having some shortness of breath with ambulation.  She was ambulating with a walker.  She was not having any new PND or orthopnea.  She was noted to have had a fall which resulted in her hitting her head.  She did not want to  go to the emergency department.  She was noted to have some shoulder pain related to her fall.  She denied chest pressure, arm and neck discomfort.  She was not having any new palpitations.  She denied presyncope and syncope.  Her heart rate was noted to be 58.  Her Zaroxolyn was increased to daily for 5 days along with extra potassium.  Salt and fluid restriction were reviewed.  Her BMP on 04/30/2022 showed a potassium of 2.7 and a creatinine of 1.09.  Her follow-up lab work 05/04/2022 was stable.  She presented to the clinic 07/09/2022 for follow-up evaluation stated she had been having difficulty keeping her fluid off.  Her weight today was 368 pounds.  We reviewed the importance of fluid restriction and sodium restriction.  She expressed understanding.  She reported compliance with her medications.  She did mention that she had been drinking around 90 ounces per day.  She tried to elevate her lower extremities when she was not active.  She reported that the last time she was sent to the emergency department the emergency department physician was not nice to her.  I  doubled her diuresis, asked her take metolazone 3 times this week, ordered a  bmet in 1 week, and plan follow-up in 2 weeks.  She presents to clinic today for follow-up evaluation states***  Today she denies chest pain, shortness of breath, fatigue, palpitations, melena, hematuria, hemoptysis, diaphoresis, weakness, presyncope, syncope, orthopnea, and PND.    Home Medications    Prior to Admission medications   Medication Sig Start Date End Date Taking? Authorizing Provider  Accu-Chek Softclix Lancets lancets Use as directed up to 4 times daily Patient taking differently: 1 each by Other route in the morning, at noon, in the evening, and at bedtime. 04/11/21   Elodia Florence., MD  acetaminophen (TYLENOL) 325 MG tablet Take 650 mg by mouth every 6 (six) hours as needed for headache or fever (pain).    [provider]  albuterol (PROAIR HFA) 108 (90 Base) MCG/ACT inhaler Inhale 2 puffs into the lungs every 6 (six) hours as needed for wheezing or shortness of breath. 11/05/20   Lucretia Kern, DO  Blood Glucose Monitoring Suppl (ACCU-CHEK GUIDE) w/Device KIT Use as directed Patient taking differently: 1 each by Other route as directed. 04/11/21   Elodia Florence., MD  cholecalciferol (VITAMIN D3) 25 MCG (1000 UNIT) tablet Take 2,000 Units by mouth daily.    [provider]  citalopram (CELEXA) 20 MG tablet Take 1 tablet (20 mg total) by mouth daily. 01/30/22   Biagio Borg, MD  clonazePAM (KLONOPIN) 1 MG tablet TAKE ONE TABLET BY MOUTH TWICE DAILY AS NEEDED FOR ANXIETY 06/04/22   Biagio Borg, MD  cyclobenzaprine (FLEXERIL) 10 MG tablet TAKE 1 TABLET BY MOUTH 2 TIMES A DAY AS NEEDED FOR MUSCLE SPASMS Patient taking differently: Take 10 mg by mouth daily as needed. TAKE 1 TABLET BY MOUTH 2 TIMES A DAY AS NEEDED FOR MUSCLE SPASMS 02/27/21   Biagio Borg, MD  Fexofenadine HCl (ALLEGRA PO) Take 180 mg by mouth daily as needed (allergies).    [provider]  glucose blood (ACCU-CHEK GUIDE) test strip Use as instructed up to 4 times daily  04/11/21   Elodia Florence., MD  Magnesium 400 MG TABS Take 400 mg by mouth in the morning and at bedtime. 03/10/22   Hoyt Koch, MD  Metamucil Fiber CHEW Chew 3 tablets by  mouth at bedtime.    [provider]  metolazone (ZAROXOLYN) 5 MG tablet Take 5 mg by mouth 2 (two) times a week. Per nephrologist    Claudia Desanctis, MD  metoprolol tartrate (LOPRESSOR) 25 MG tablet Take 25 mg by mouth 2 (two) times daily.    [provider]  NARCAN 4 MG/0.1ML LIQD nasal spray kit Place 0.4 mg into the nose daily as needed (opioid overdose). 04/25/19   [provider]  ondansetron (ZOFRAN ODT) 4 MG disintegrating tablet Take 1 tablet (4 mg total) by mouth every 4 (four) hours as needed for nausea or vomiting. 08/23/19   Charlesetta Shanks, MD  Oxycodone HCl 10 MG TABS Take 1 tablet (10 mg total) 4 (four) times daily by mouth. Per [facility] Patient taking differently: Take 10 mg by mouth every 4 (four) hours as needed (pain). Per [facility] 11/08/17   Biagio Borg, MD  pantoprazole (PROTONIX) 40 MG tablet TAKE ONE TABLET BY MOUTH DAILY 01/29/22   Biagio Borg, MD  Polyethyl Glycol-Propyl Glycol 0.4-0.3 % SOLN Apply 1 drop to eye daily as needed (dry eyes).    [provider]  polyethylene glycol powder (GLYCOLAX/MIRALAX) 17 GM/SCOOP powder Take 17 g by mouth daily as needed for mild constipation. 04/11/21   Elodia Florence., MD  potassium chloride SA (KLOR-CON M) 20 MEQ tablet 2 tablets in the morning and 1 tablet in the afternoon 04/23/22   Minus Breeding, MD  pramipexole (MIRAPEX) 0.5 MG tablet Take 1 tablet (0.5 mg total) by mouth 3 (three) times daily. 02/03/22   Biagio Borg, MD  Semaglutide, 1 MG/DOSE, 4 MG/3ML SOPN Inject 1 mg as directed once a week. 01/30/22   Biagio Borg, MD  torsemide (DEMADEX) 20 MG tablet Take 2 tablets in the morning and 1 tablet in the afternoon. Patient taking differently: 79m twice daily - per nephrologist  12/30/21   HMinus Breeding MD  DULoxetine (CYMBALTA) 60 MG capsule Take 60 mg by mouth daily.    03/09/12  [provider]    Family History    Family History  Problem Relation Age of Onset   Heart disease Mother    Hypertension Mother    Diabetes Mother    Heart failure Mother    Asthma Sister    Anxiety disorder Sister    Depression Sister    Hypertension Father    Asthma Daughter    Bipolar disorder Daughter    Cancer Maternal Uncle        [place]   Cancer Other        ovarian   Liver cancer Paternal Grandmother        ????   She indicated that her mother is deceased. She indicated that her father is alive. She indicated that her sister is alive. She indicated that her brother is alive. She indicated that the status of her paternal grandmother is unknown. She indicated that the status of her daughter is unknown. She indicated that her child is alive. She indicated that the status of her maternal uncle is unknown. She indicated that the status of her other is unknown.  Social History    Social History   Socioeconomic History   Marital status: Divorced    Spouse name: Not on file   Number of children: 2   Years of education: Not on file   Highest education level: Not on file  Occupational History   Occupation: disabled  Tobacco Use   Smoking status: Former    Years: 30.00    Types: Cigarettes    Quit date: 11/08/2008    Years since quitting: 13.6   Smokeless tobacco: Never   Tobacco comments:    quit 10/09  Vaping Use   Vaping Use: Never used  Substance and Sexual Activity   Alcohol use: No    Comment: quit drinking 10/07/1997   Drug use: No   Sexual activity: Not Currently    Birth control/protection: Surgical, Abstinence  Other Topics Concern   Not on file  Social History Narrative   Patient has difficulty financially affording food, medication, and affording essential bills. She states she has limited support from her daughter.    Social  Determinants of Health   Financial Resource Strain: Medium Risk (12/30/2021)   Overall Financial Resource Strain (CARDIA)    Difficulty of Paying Living Expenses: Somewhat hard  Food Insecurity: No Food Insecurity (04/03/2022)   Hunger Vital Sign    Worried About Running Out of Food in the Last Year: Never true    Ran Out of Food in the Last Year: Never true  Transportation Needs: Unmet Transportation Needs (04/22/2022)   PRAPARE - Hydrologist (Medical): No    Lack of Transportation (Non-Medical): Yes  Physical Activity: Inactive (12/30/2021)   Exercise Vital Sign    Days of Exercise per Week: 0 days    Minutes of Exercise per Session: 0 min  Stress: No Stress Concern Present (12/30/2021)   [place]    Feeling of Stress : Not at all  Social Connections: Socially Isolated (12/30/2021)   Social Connection and Isolation Panel [NHANES]    Frequency of Communication with Friends and Family: More than three times a week    Frequency of Social Gatherings with Friends and Family: More than three times a week    Attends Religious Services: Never    Marine scientist or Organizations: No    Attends Archivist Meetings: Never    Marital Status: Never married  Intimate Partner Violence: Not At Risk (12/30/2021)   Humiliation, Afraid, Rape, and Kick questionnaire    Fear of Current or Ex-Partner: No    Emotionally Abused: No    Physically Abused: No    Sexually Abused: No     Review of Systems    General:  No chills, fever, night sweats or weight changes.  Cardiovascular:  No chest pain, dyspnea on exertion, edema, orthopnea, palpitations, paroxysmal nocturnal dyspnea. Dermatological: No rash, lesions/masses Respiratory: No cough, dyspnea Urologic: No hematuria, dysuria Abdominal:   No nausea, vomiting, diarrhea, bright red blood per rectum, melena, or hematemesis Neurologic:  No  visual changes, wkns, changes in mental status. All other systems reviewed and are otherwise negative except as noted above.  Physical Exam    VS:  There were no vitals taken for this visit. , BMI There is no height or weight on file to calculate BMI. GEN: Well nourished, well developed, in no acute distress. HEENT: normal. Neck: Supple, no JVD, carotid bruits, or masses. Cardiac: RRR, no murmurs, rubs, or gallops. No clubbing, cyanosis, +3 pitting bilateral lower extremity edema with bilateral slight ankle weeping.  Radials/DP/PT 2+ and equal bilaterally.  Respiratory:  Respirations regular and unlabored, clear to auscultation bilaterally. GI: Soft, nontender, nondistended, BS + x 4. MS: no deformity or atrophy. Skin: warm and dry, no rash.  No signs of infection  Neuro:  Strength and sensation are intact. Psych: Normal affect.  Accessory Clinical Findings    Recent Labs: 01/30/2022: TSH 3.19 03/09/2022: ALT 18; Magnesium 1.7 04/30/2022: B Natriuretic Peptide 99.0; Hemoglobin 12.1; Platelets 262 07/03/2022: BUN 40; Creatinine, Ser 1.65; Potassium 2.9; Sodium 141   Recent Lipid Panel    Component Value Date/Time   CHOL 126 01/30/2022 1342   TRIG 120.0 01/30/2022 1342   TRIG 126 12/31/2006 0847   HDL 45.40 01/30/2022 1342   CHOLHDL 3 01/30/2022 1342   VLDL 24.0 01/30/2022 1342   LDLCALC 56 01/30/2022 1342   LDLDIRECT 127.0 01/25/2019 1423    ECG personally reviewed by me today-none today.  Echocardiogram 09/20/2021 IMPRESSIONS     1. Left ventricular ejection fraction, by estimation, is 65 to 70%. The  left ventricle has normal function. The left ventricle has no regional  wall motion abnormalities. Left ventricular diastolic parameters are  consistent with Grade II diastolic  dysfunction (pseudonormalization). Elevated left ventricular end-diastolic  pressure.   2. Right ventricular systolic function is normal. The right ventricular  size is normal. Tricuspid  regurgitation signal is inadequate for assessing  PA pressure.   3. Left atrial size was upper normal.   4. The mitral valve is grossly normal. No evidence of mitral valve  regurgitation.   5. The aortic valve is tricuspid. Aortic valve regurgitation is not  visualized.   6. The inferior vena cava is dilated in size with >50% respiratory  variability, suggesting right atrial pressure of 8 mmHg.   Comparison(s): Prior images reviewed side by side. LVEF vigorous with  probable increased LVEDP.  Assessment & Plan   1.  Acute on chronic diastolic CHF-weight today ***368.8.  1+ bilateral lower extremity pitting edema.  Again reviewed importance of fluid restriction and low-sodium diet. Continue metoprolol, losartan, potassium, torsemide, metolazone Heart healthy low-sodium diet-salty 6 given Increase physical activity as tolerated Fluid restriction 48-56 ounces or less Maintain weight log Elevate lower extremities when not active  Essential hypertension-BP today ***158/60.  Well-controlled post diuresis.  Continue metoprolol, losartan Heart healthy low-sodium diet Increase physical activity as tolerated  Hyperlipidemia-LDL 56 on 01/30/2022 Heart healthy low-sodium high-fiber diet Follows with PCP  Type 2 diabetes-A1c 6.9 on 01/30/2022 Continue metformin Heart healthy low-sodium diet-salty 6 given Increase physical activity as tolerated Follows with PCP   Disposition: Follow-up with Dr. Percival Spanish or me in 2-3   Jossie Ng. Salayah Meares NP-C    07/11/2022, 12:18 PM [address] [place] [address] Office [phone] Fax [phone]  Notice: This dictation was prepared with Dragon dictation along with smaller phrase technology. Any transcriptional errors that result from this process are unintentional and may not be corrected upon review.  I spent 14*** minutes examining this patient, reviewing medications, and using patient centered shared decision  making involving her cardiac care.  Prior to her visit I spent greater than 20 minutes reviewing her past medical history,  medications, and prior cardiac tests.

## 2022-07-13 ENCOUNTER — Ambulatory Visit: Payer: Medicare Other | Admitting: General Practice

## 2022-07-13 NOTE — Telephone Encounter (Signed)
Spoke to patient she stated she had to cancel her appointment with Coletta Memos NP today.Stated her transportation could not bring her.Appointment with Denyse Amass rescheduled to 7/28 at 2:45 pm.

## 2022-07-13 NOTE — Telephone Encounter (Signed)
Pt called back upset because her insurance canceled her transportation for her appt scheduled for today 07/24 due to her insurance stating she is a liability.  She is calling back stating that the weeping in her legs is not better and would like a call back to talk about her options.

## 2022-07-14 ENCOUNTER — Telehealth: Payer: Self-pay | Admitting: Licensed Clinical Social Worker

## 2022-07-14 NOTE — Telephone Encounter (Signed)
H&V Care Navigation CSW Progress Note  Clinical Social Worker  contacted [employer] [place]  to follow up on application submitted. Per representative Loma Sousa with [employer] pt had several questions she missed on application therefore they were unable to full process application. I responded to representative to see if she has spoken with pt and she has confirmed she sent her a letter letting her know that it is incomplete. I called and was able to reach pt at [phone]. I let her know the above and she confirms she hasn't been able to get her mail lately so she wasn't aware. I provided her with [employer] contact and encouraged her to call and see if they can telephonically complete. Pt agreeable, appreciative for call.    Patient is participating in a Managed Medicaid Plan:  No, [place] Medicare only   SDOH Screenings   Alcohol Screen: Low Risk  (12/30/2021)   Alcohol Screen    Last Alcohol Screening Score (AUDIT): 0  Depression (PHQ2-9): Medium Risk (01/30/2022)   Depression (PHQ2-9)    PHQ-2 Score: 9  Financial Resource Strain: Medium Risk (12/30/2021)   Overall Financial Resource Strain (CARDIA)    Difficulty of Paying Living Expenses: Somewhat hard  Food Insecurity: No Food Insecurity (04/03/2022)   Hunger Vital Sign    Worried About Running Out of Food in the Last Year: Never true    Ran Out of Food in the Last Year: Never true  Housing: Low Risk  (04/03/2022)   Housing    Last Housing Risk Score: 0  Physical Activity: Inactive (12/30/2021)   Exercise Vital Sign    Days of Exercise per Week: 0 days    Minutes of Exercise per Session: 0 min  Social Connections: Socially Isolated (12/30/2021)   Social Connection and Isolation Panel [NHANES]    Frequency of Communication with Friends and Family: More than three times a week    Frequency of Social Gatherings with Friends and Family: More than three times a week    Attends Religious Services: Never    Marine scientist or  Organizations: No    Attends Archivist Meetings: Never    Marital Status: Never married  Stress: No Stress Concern Present (12/30/2021)   [employer] [place] of Stress : Not at all  Tobacco Use: Medium Risk (06/22/2022)   Patient History    Smoking Tobacco Use: Former    Smokeless Tobacco Use: Never    Passive Exposure: Not on file  Transportation Needs: Unmet Transportation Needs (04/22/2022)   PRAPARE - Hydrologist (Medical): No    Lack of Transportation (Non-Medical): Yes   Westley Hummer, MSW, [place]  [phone]- work cell phone (preferred) [phone]- desk phone

## 2022-07-15 NOTE — Progress Notes (Signed)
Cardiology Clinic Note   Patient Name: Maria [name] Date of Encounter: 07/17/2022  Primary Care Provider:  Corwin Levins, MD Primary Cardiologist:  Rollene Rotunda, MD  Patient Profile    Maria [name] 66 year old female presents to clinic today for follow-up evaluation of her chronic diastolic CHF.  Past Medical History    Past Medical History:  Diagnosis Date   Acute lymphadenitis 2011   ALLERGIC RHINITIS 08/10/2007   ANXIETY 12/06/2007   Atherosclerotic peripheral vascular disease (HCC) 06/13/2013   Aorta on CT June 2014   Cellulitis 2011   Cervical disc disease 03/09/2012   CHF (congestive heart failure) (HCC)    Chronic pain 03/09/2012   DEPRESSION 12/06/2007   Diabetes (HCC)    DIVERTICULOSIS, [place] 12/06/2007   GERD 12/06/2007   Hepatitis    age 28 hepatitis A   HLD (hyperlipidemia) 05/24/2019   HYPERTENSION 12/06/2007   Lumbar disc disease 03/09/2012   Mesenteric adenitis    MRSA 2006   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 2010   CERVICAL DISC SURGERY  may 24th 2010   COLONOSCOPY WITH PROPOFOL N/A 07/10/2016   Procedure: COLONOSCOPY WITH PROPOFOL;  Surgeon: Ruffin Frederick, MD;  Location: [facility];  Service: Gastroenterology;  Laterality: N/A;   COLONOSCOPY WITH PROPOFOL N/A 09/26/2019   Procedure: COLONOSCOPY WITH PROPOFOL;  Surgeon: Benancio Deeds, MD;  Location: [facility];  Service: Gastroenterology;  Laterality: N/A;   ESOPHAGOGASTRODUODENOSCOPY (EGD) WITH PROPOFOL N/A 07/10/2016   Procedure: ESOPHAGOGASTRODUODENOSCOPY (EGD) WITH PROPOFOL;  Surgeon: Ruffin Frederick, MD;  Location: [facility];  Service: Gastroenterology;  Laterality: N/A;   POLYPECTOMY  09/26/2019   Procedure: POLYPECTOMY;  Surgeon: Benancio Deeds, MD;  Location: [facility];  Service: Gastroenterology;;   s/p ovary cyst     s/p right knee arthroscopy     Dr. Dion Saucier ortho    Allergies  Allergies  Allergen Reactions   Amoxicillin-Pot Clavulanate Nausea And Vomiting    Projectile vomiting Did it involve swelling of the face/tongue/throat, SOB, or low BP? No Did it involve sudden or severe rash/hives, skin peeling, or any reaction on the inside of your mouth or nose? No Did you need to seek medical attention at a hospital or doctor's office? No When did it last happen?      20-30 years If all above answers are "NO", may proceed with cephalosporin use.    Adhesive [Tape] Other (See Comments)    Tears skin off - use paper tape    Codeine Other (See Comments)    hallucinations   Crestor [Rosuvastatin Calcium]     Severe muscle cramps   Morphine And Related Nausea And Vomiting and Other (See Comments)    Migraine headaches   Vicodin [Hydrocodone-Acetaminophen] Other (See Comments)    hallucinations   Latex Rash    History of Present Illness    Maria [name] has a PMH of hyperlipidemia, hypertension, GERD, anxiety, depression, type 2 diabetes, and CHF.  Echocardiogram showed an EF of 60-65% with diastolic dysfunction 03/08/2021.  She presented to the emergency department 08/10/2021 with increased shortness of breath.  She noted a 13 pound weight gain over the previous 7 days with worsening lower extremity edema.  She did note PND and orthopnea.  She was unable to lay flat and was sleeping in her recliner.  She reported taking torsemide as prescribed.  She denied chest discomfort.  She also denied infectious symptoms aside from increased shortness of breath and increased urinary frequency due to her diuretic.  She did note mild dysuria but denied fever and chills.  Her blood pressure at that time was 123/76 with a pulse of 66.  Her creatinine was 1.05.  Her EKG showed normal sinus rhythm.  She received IV furosemide.  Her chest  x-ray showed persistent cardiac enlargement and central pulmonary vascular engorgement with no change from prior chest x-rays, no signs of lobar consolidation, pleural effusion, or frank edema.  Her BNP at that time was 22.  Troponins were normal.  It was also felt that she had a early UTI.  She was also prescribed Keflex.  Her CTA was negative for acute PE with no evidence of pneumo, pleural effusion, pericardial effusion, aortic dissection or focal consolidation.  Social work evaluated and recommended home physical therapy, prescription for walker.  It was also recommended that she use lower extremity support stockings and elevate her lower extremities to assist with her lower extremity edema.  She was discharged in stable condition on 08/11/2021.   Due to her limits and mobility she was unable to present to the clinic 08/18/2021.  She  was contacted via phone and stated she was frustrated with her lower extremity swelling.  She reported that she had ordered lower extremity support stockings and was excited to give them a try.  She reported that she did not add salt to her food.  She had been consuming about 4 bottles of water per day along with tea.  We reviewed the importance of selecting food options that are low/no salt and fluid restriction.  I recommended that she drink no more than about 48 ounces per day.  She had been taking her torsemide at about 9 or 10:00 and then again at 5 or 6:00 in the evening.  She had not been sleeping well due to frequent urination.  We discussed taking her medication earlier in the day.  I have encouraged her to continue to weigh herself daily and keep a log, Igave her the salty 6 diet sheet, gave parameters for fluid restriction, and asked her to follow-up in 3 months and as needed.  She was seen in follow-up by Almyra Deforest, PA-C on 09/09/2021.  She took 2.5 mg of metolazone and did not notice significant urine output.  She was still taking torsemide 60 mg twice daily.  Her  weight at home was around 367 pounds.  Her weight diary was reviewed and her weight was ranging between 367 pounds up to 375 pounds.  On physical exam she did not have significant lower extremity edema.  Her lung sounds were noted to be clear.  She was euvolemic on exam.  Her medication was continued.  Her BMP showed a creatinine of 1.18 and a potassium of 3.6.   She was seen in follow-up by Dr. Percival Spanish on 04/21/2022.  During that time her weight was around 356 pounds.  She reported she felt like she was carrying more weight in her legs.  She felt like she was having some shortness of breath with ambulation.  She was ambulating with a walker.  She was not having any new PND or orthopnea.  She was noted to have had a fall which resulted in her hitting her head.  She did not want  to go to the emergency department.  She was noted to have some shoulder pain related to her fall.  She denied chest pressure, arm and neck discomfort.  She was not having any new palpitations.  She denied presyncope and syncope.  Her heart rate was noted to be 58.  Her Zaroxolyn was increased to daily for 5 days along with extra potassium.  Salt and fluid restriction were reviewed.  Her BMP on 04/30/2022 showed a potassium of 2.7 and a creatinine of 1.09.  Her follow-up lab work 05/04/2022 was stable.  She presented to the clinic 06/22/2022 for follow-up evaluation stated she had been having difficulty keeping her fluid off.  Her weight  was 368 pounds.  We reviewed the importance of fluid restriction and sodium restriction.  She expressed understanding.  She reported compliance with her medications.  She did mention that she had been drinking around 90 ounces per day.  She tried to elevate her lower extremities when she was not active.  She reported that the last time she was sent to the emergency department the emergency department physician was not nice to her.  I  doubled her diuresis, asked her take metolazone 3 times this week, ordered a  bmet in 1 week, and plan follow-up in 2 weeks.  Her BMP showed a potassium of 2.9 which was improved from 2.7.  Her creatinine was 1.65.  I increased her potassium.  She presents to clinic today for follow-up evaluation states her weight at home is 358 pounds.  She continues to have weeping from her lower extremities.  She has been trying to keep them clean and dry.  She denies fever and chills.  She has been following fluid restriction and reports that her breathing has improved.  We reviewed fluid restriction.  I will have her continue to weigh herself daily, order a BMP, have her monitor for signs of infection, continue her current medication regimen and plan follow-up in 2 to 3 months.  Today she denies chest pain, shortness of breath, fatigue, palpitations, melena, hematuria, hemoptysis, diaphoresis, weakness, presyncope, syncope, orthopnea, and PND.    Home Medications    Prior to Admission medications   Medication Sig Start Date End Date Taking? Authorizing Provider  Accu-Chek Softclix Lancets lancets Use as directed up to 4 times daily Patient taking differently: 1 each by Other route in the morning, at noon, in the evening, and at bedtime. 04/11/21   Elodia Florence., MD  acetaminophen (TYLENOL) 325 MG tablet Take 650 mg by mouth every 6 (six) hours as needed for headache or fever (pain).    [provider]  albuterol (PROAIR HFA) 108 (90 Base) MCG/ACT inhaler Inhale 2 puffs into the lungs every 6 (six) hours as needed for wheezing or shortness of breath. 11/05/20   Lucretia Kern, DO  Blood Glucose Monitoring Suppl (ACCU-CHEK GUIDE) w/Device KIT Use as directed Patient taking differently: 1 each by Other route as directed. 04/11/21   Elodia Florence., MD  cholecalciferol (VITAMIN D3) 25 MCG (1000 UNIT) tablet Take 2,000 Units by mouth daily.    [provider]  citalopram (CELEXA) 20 MG tablet Take 1 tablet (20 mg total) by mouth daily. 01/30/22   Biagio Borg,  MD  clonazePAM (KLONOPIN) 1 MG tablet TAKE ONE TABLET BY MOUTH TWICE DAILY AS NEEDED FOR ANXIETY 06/04/22   Biagio Borg, MD  cyclobenzaprine (FLEXERIL) 10 MG tablet TAKE 1 TABLET BY MOUTH 2 TIMES A DAY AS  NEEDED FOR MUSCLE SPASMS Patient taking differently: Take 10 mg by mouth daily as needed. TAKE 1 TABLET BY MOUTH 2 TIMES A DAY AS NEEDED FOR MUSCLE SPASMS 02/27/21   Biagio Borg, MD  Fexofenadine HCl (ALLEGRA PO) Take 180 mg by mouth daily as needed (allergies).    [provider]  glucose blood (ACCU-CHEK GUIDE) test strip Use as instructed up to 4 times daily 04/11/21   Elodia Florence., MD  Magnesium 400 MG TABS Take 400 mg by mouth in the morning and at bedtime. 03/10/22   Hoyt Koch, MD  Metamucil Fiber CHEW Chew 3 tablets by mouth at bedtime.    [provider]  metolazone (ZAROXOLYN) 5 MG tablet Take 5 mg by mouth 2 (two) times a week. Per nephrologist    Claudia Desanctis, MD  metoprolol tartrate (LOPRESSOR) 25 MG tablet Take 25 mg by mouth 2 (two) times daily.    [provider]  NARCAN 4 MG/0.1ML LIQD nasal spray kit Place 0.4 mg into the nose daily as needed (opioid overdose). 04/25/19   [provider]  ondansetron (ZOFRAN ODT) 4 MG disintegrating tablet Take 1 tablet (4 mg total) by mouth every 4 (four) hours as needed for nausea or vomiting. 08/23/19   Charlesetta Shanks, MD  Oxycodone HCl 10 MG TABS Take 1 tablet (10 mg total) 4 (four) times daily by mouth. Per [facility] Patient taking differently: Take 10 mg by mouth every 4 (four) hours as needed (pain). Per [facility] 11/08/17   Biagio Borg, MD  pantoprazole (PROTONIX) 40 MG tablet TAKE ONE TABLET BY MOUTH DAILY 01/29/22   Biagio Borg, MD  Polyethyl Glycol-Propyl Glycol 0.4-0.3 % SOLN Apply 1 drop to eye daily as needed (dry eyes).    [provider]  polyethylene glycol powder (GLYCOLAX/MIRALAX) 17 GM/SCOOP powder Take 17 g by mouth daily as needed for mild  constipation. 04/11/21   Elodia Florence., MD  potassium chloride SA (KLOR-CON M) 20 MEQ tablet 2 tablets in the morning and 1 tablet in the afternoon 04/23/22   Minus Breeding, MD  pramipexole (MIRAPEX) 0.5 MG tablet Take 1 tablet (0.5 mg total) by mouth 3 (three) times daily. 02/03/22   Biagio Borg, MD  Semaglutide, 1 MG/DOSE, 4 MG/3ML SOPN Inject 1 mg as directed once a week. 01/30/22   Biagio Borg, MD  torsemide (DEMADEX) 20 MG tablet Take 2 tablets in the morning and 1 tablet in the afternoon. Patient taking differently: 60mg  twice daily - per nephrologist 12/30/21   Minus Breeding, MD  DULoxetine (CYMBALTA) 60 MG capsule Take 60 mg by mouth daily.    03/09/12  [provider]    Family History    Family History  Problem Relation Age of Onset   Heart disease Mother    Hypertension Mother    Diabetes Mother    Heart failure Mother    Asthma Sister    Anxiety disorder Sister    Depression Sister    Hypertension Father    Asthma Daughter    Bipolar disorder Daughter    Cancer Maternal Uncle        [place]   Cancer Other        ovarian   Liver cancer Paternal Grandmother        ????   She indicated that her mother is deceased. She indicated that her father is alive. She indicated that her sister is alive. She  indicated that her brother is alive. She indicated that the status of her paternal grandmother is unknown. She indicated that the status of her daughter is unknown. She indicated that her child is alive. She indicated that the status of her maternal uncle is unknown. She indicated that the status of her other is unknown.  Social History    Social History   Socioeconomic History   Marital status: Divorced    Spouse name: Not on file   Number of children: 2   Years of education: Not on file   Highest education level: Not on file  Occupational History   Occupation: disabled  Tobacco Use   Smoking status: Former    Years: 30.00    Types: Cigarettes     Quit date: 11/08/2008    Years since quitting: 13.6   Smokeless tobacco: Never   Tobacco comments:    quit 10/09  Vaping Use   Vaping Use: Never used  Substance and Sexual Activity   Alcohol use: No    Comment: quit drinking 10/07/1997   Drug use: No   Sexual activity: Not Currently    Birth control/protection: Surgical, Abstinence  Other Topics Concern   Not on file  Social History Narrative   Patient has difficulty financially affording food, medication, and affording essential bills. She states she has limited support from her daughter.    Social Determinants of Health   Financial Resource Strain: Medium Risk (12/30/2021)   Overall Financial Resource Strain (CARDIA)    Difficulty of Paying Living Expenses: Somewhat hard  Food Insecurity: No Food Insecurity (04/03/2022)   Hunger Vital Sign    Worried About Running Out of Food in the Last Year: Never true    Ran Out of Food in the Last Year: Never true  Transportation Needs: Unmet Transportation Needs (04/22/2022)   PRAPARE - Hydrologist (Medical): No    Lack of Transportation (Non-Medical): Yes  Physical Activity: Inactive (12/30/2021)   Exercise Vital Sign    Days of Exercise per Week: 0 days    Minutes of Exercise per Session: 0 min  Stress: No Stress Concern Present (12/30/2021)   [place]    Feeling of Stress : Not at all  Social Connections: Socially Isolated (12/30/2021)   Social Connection and Isolation Panel [NHANES]    Frequency of Communication with Friends and Family: More than three times a week    Frequency of Social Gatherings with Friends and Family: More than three times a week    Attends Religious Services: Never    Marine scientist or Organizations: No    Attends Archivist Meetings: Never    Marital Status: Never married  Intimate Partner Violence: Not At Risk (12/30/2021)   Humiliation, Afraid,  Rape, and Kick questionnaire    Fear of Current or Ex-Partner: No    Emotionally Abused: No    Physically Abused: No    Sexually Abused: No     Review of Systems    General:  No chills, fever, night sweats or weight changes.  Cardiovascular:  No chest pain, dyspnea on exertion, edema, orthopnea, palpitations, paroxysmal nocturnal dyspnea. Dermatological: No rash, lesions/masses Respiratory: No cough, dyspnea Urologic: No hematuria, dysuria Abdominal:   No nausea, vomiting, diarrhea, bright red blood per rectum, melena, or hematemesis Neurologic:  No visual changes, wkns, changes in mental status. All other systems reviewed and are otherwise negative except as noted above.  Physical Exam    VS:  BP 139/67   Pulse 79   Ht $R'5\' 3"'OZ$  (1.6 m)   Wt (!) 361 lb 4.8 oz (163.9 kg)   SpO2 96%   BMI 64.00 kg/m  , BMI Body mass index is 64 kg/m. GEN: Well nourished, well developed, in no acute distress. HEENT: normal. Neck: Supple, no JVD, carotid bruits, or masses. Cardiac: RRR, no murmurs, rubs, or gallops. No clubbing, cyanosis, 2+ pitting bilateral lower extremity edema with bilateral slight ankle weeping.  Radials/DP/PT 2+ and equal bilaterally.  Respiratory:  Respirations regular and unlabored, clear to auscultation bilaterally. GI: Soft, nontender, nondistended, BS + x 4. MS: no deformity or atrophy. Skin: warm and dry, no rash.  No signs of infection Neuro:  Strength and sensation are intact. Psych: Normal affect.  Accessory Clinical Findings    Recent Labs: 01/30/2022: TSH 3.19 03/09/2022: ALT 18; Magnesium 1.7 04/30/2022: B Natriuretic Peptide 99.0; Hemoglobin 12.1; Platelets 262 07/03/2022: BUN 40; Creatinine, Ser 1.65; Potassium 2.9; Sodium 141   Recent Lipid Panel    Component Value Date/Time   CHOL 126 01/30/2022 1342   TRIG 120.0 01/30/2022 1342   TRIG 126 12/31/2006 0847   HDL 45.40 01/30/2022 1342   CHOLHDL 3 01/30/2022 1342   VLDL 24.0 01/30/2022 1342   LDLCALC 56  01/30/2022 1342   LDLDIRECT 127.0 01/25/2019 1423    ECG personally reviewed by me today-none today.  Echocardiogram 09/20/2021 IMPRESSIONS     1. Left ventricular ejection fraction, by estimation, is 65 to 70%. The  left ventricle has normal function. The left ventricle has no regional  wall motion abnormalities. Left ventricular diastolic parameters are  consistent with Grade II diastolic  dysfunction (pseudonormalization). Elevated left ventricular end-diastolic  pressure.   2. Right ventricular systolic function is normal. The right ventricular  size is normal. Tricuspid regurgitation signal is inadequate for assessing  PA pressure.   3. Left atrial size was upper normal.   4. The mitral valve is grossly normal. No evidence of mitral valve  regurgitation.   5. The aortic valve is tricuspid. Aortic valve regurgitation is not  visualized.   6. The inferior vena cava is dilated in size with >50% respiratory  variability, suggesting right atrial pressure of 8 mmHg.   Comparison(s): Prior images reviewed side by side. LVEF vigorous with  probable increased LVEDP.  Assessment & Plan   1.  Acute on chronic diastolic CHF-weight today 361lbs  1+ bilateral lower extremity pitting edema.  Again reviewed importance of fluid restriction and low-sodium diet. Continue metoprolol, losartan, potassium, torsemide, metolazone Heart healthy low-sodium diet-salty 6 given Increase physical activity as tolerated Fluid restriction 48-56 ounces or less Maintain weight log Elevate lower extremities when not active Wear lower extremity support stockings BMP  Essential hypertension-BP today 139/67.  Well-controlled post diuresis.  Continue metoprolol, losartan Heart healthy low-sodium diet Increase physical activity as tolerated    Disposition: Follow-up with Dr. Percival Spanish or me in 2-3   Jossie Ng. Karyme Mcconathy NP-C     07/17/2022, 3:13 PM [facility] [address]  [address] Office [phone] Fax [phone]  Notice: This dictation was prepared with Dragon dictation along with smaller phrase technology. Any transcriptional errors that result from this process are unintentional and may not be corrected upon review.  I spent 13 minutes examining this patient, reviewing medications, and using patient centered shared decision making involving her cardiac care.  Prior to her visit I spent greater than 20  minutes reviewing her past medical history,  medications, and prior cardiac tests.

## 2022-07-17 ENCOUNTER — Encounter: Payer: Self-pay | Admitting: General Practice

## 2022-07-17 ENCOUNTER — Ambulatory Visit (INDEPENDENT_AMBULATORY_CARE_PROVIDER_SITE_OTHER): Payer: Medicare Other | Admitting: General Practice

## 2022-07-17 VITALS — BP 139/67 | HR 79 | Ht 63.0 in | Wt 361.3 lb

## 2022-07-17 DIAGNOSIS — I5033 Acute on chronic diastolic (congestive) heart failure: Secondary | ICD-10-CM

## 2022-07-17 DIAGNOSIS — I10 Essential (primary) hypertension: Secondary | ICD-10-CM

## 2022-07-17 NOTE — Patient Instructions (Signed)
Medication Instructions:  The current medical regimen is effective;  continue present plan and medications as directed. Please refer to the Current Medication list given to you today.   *If you need a refill on your cardiac medications before your next appointment, please call your pharmacy*  Lab Work:   Testing/Procedures:  BMET TODAY   NONE If you have labs (blood work) drawn today and your tests are completely normal, you will receive your results only by: [place] (if you have MyChart) OR  A paper copy in the mail If you have any lab test that is abnormal or we need to change your treatment, we will call you to review the results.  Special Instructions TAKE AND LOG YOUR WEIGHT DAILY  CONTINUE FLUID RESTRICTION 48-56 OZ ONLY DAILY  PLEASE PURCHASE AND WEAR COMPRESSION STOCKINGS DAILY AND TAKE OFF AT BEDTIME. Compression stockings are elastic socks that squeeze the legs. They help to increase blood flow to the legs and to decrease swelling in the legs from fluid retention, and reduce the chance of developing blood clots in the lower legs. Please put on in the AM when dressing and off at night when dressing for bed.  LET THEM KNOW THAT YOU NEED KNEE HIGH'S WITH COMPRESSION OF 15-20 mmhg.  [employer]  [place] ([place] [id number]); [place], [place] [address]; [phone]  EMAIL   [email or web address].  PLEASE MAKE SURE TO ELEVATE YOUR FEET & LEGS WHILE SITTING, THIS WILL HELP WITH THE SWELLING ALSO.    Follow-Up: Your next appointment:  2-3 month(s) Virtual Visit  with Minus Breeding, MD    At [facility], you and your health needs are our priority.  As part of our continuing mission to provide you with exceptional heart care, we have created designated Provider Care Teams.  These Care Teams include your primary Cardiologist (physician) and Advanced Practice Providers (APPs -  Physician Assistants and Nurse Practitioners) who all work together to provide you  with the care you need, when you need it.  Important Information About Sugar

## 2022-07-18 LAB — BASIC METABOLIC PANEL
BUN/Creatinine Ratio: 15 (ref 12–28)
BUN: 31 mg/dL — ABNORMAL HIGH (ref 8–27)
CO2: 19 mmol/L — ABNORMAL LOW (ref 20–29)
Calcium: 9.1 mg/dL (ref 8.7–10.3)
Chloride: 102 mmol/L (ref 96–106)
Creatinine, Ser: 2.07 mg/dL — ABNORMAL HIGH (ref 0.57–1.00)
Glucose: 116 mg/dL — ABNORMAL HIGH (ref 70–99)
Potassium: 5.7 mmol/L — ABNORMAL HIGH (ref 3.5–5.2)
Sodium: 140 mmol/L (ref 134–144)
eGFR: 26 mL/min/{1.73_m2} — ABNORMAL LOW (ref >59)

## 2022-07-20 ENCOUNTER — Encounter (HOSPITAL_COMMUNITY): Payer: Self-pay | Admitting: Emergency Medicine

## 2022-07-20 ENCOUNTER — Inpatient Hospital Stay (HOSPITAL_COMMUNITY)
Admission: EM | Admit: 2022-07-20 | Discharge: 2022-07-29 | DRG: 682 | Disposition: A | Discharge status: Home Health | Payer: Medicare Other | Attending: Family Medicine | Admitting: Family Medicine

## 2022-07-20 ENCOUNTER — Other Ambulatory Visit: Payer: Self-pay

## 2022-07-20 DIAGNOSIS — I130 Hypertensive heart and chronic kidney disease with heart failure and stage 1 through stage 4 chronic kidney disease, or unspecified chronic kidney disease: Secondary | ICD-10-CM | POA: Diagnosis present

## 2022-07-20 DIAGNOSIS — G8929 Other chronic pain: Secondary | ICD-10-CM | POA: Diagnosis present

## 2022-07-20 DIAGNOSIS — E1151 Type 2 diabetes mellitus with diabetic peripheral angiopathy without gangrene: Secondary | ICD-10-CM | POA: Diagnosis present

## 2022-07-20 DIAGNOSIS — Z87891 Personal history of nicotine dependence: Secondary | ICD-10-CM

## 2022-07-20 DIAGNOSIS — T502X5A Adverse effect of carbonic-anhydrase inhibitors, benzothiadiazides and other diuretics, initial encounter: Secondary | ICD-10-CM | POA: Diagnosis present

## 2022-07-20 DIAGNOSIS — I881 Chronic lymphadenitis, except mesenteric: Secondary | ICD-10-CM | POA: Diagnosis present

## 2022-07-20 DIAGNOSIS — N179 Acute kidney failure, unspecified: Secondary | ICD-10-CM | POA: Diagnosis not present

## 2022-07-20 DIAGNOSIS — Z8249 Family history of ischemic heart disease and other diseases of the circulatory system: Secondary | ICD-10-CM

## 2022-07-20 DIAGNOSIS — E785 Hyperlipidemia, unspecified: Secondary | ICD-10-CM | POA: Diagnosis present

## 2022-07-20 DIAGNOSIS — L03116 Cellulitis of left lower limb: Secondary | ICD-10-CM | POA: Diagnosis present

## 2022-07-20 DIAGNOSIS — N189 Chronic kidney disease, unspecified: Secondary | ICD-10-CM | POA: Diagnosis present

## 2022-07-20 DIAGNOSIS — E1169 Type 2 diabetes mellitus with other specified complication: Secondary | ICD-10-CM | POA: Diagnosis present

## 2022-07-20 DIAGNOSIS — L03115 Cellulitis of right lower limb: Secondary | ICD-10-CM | POA: Diagnosis present

## 2022-07-20 DIAGNOSIS — Z8719 Personal history of other diseases of the digestive system: Secondary | ICD-10-CM

## 2022-07-20 DIAGNOSIS — J309 Allergic rhinitis, unspecified: Secondary | ICD-10-CM | POA: Diagnosis present

## 2022-07-20 DIAGNOSIS — Z885 Allergy status to narcotic agent status: Secondary | ICD-10-CM

## 2022-07-20 DIAGNOSIS — I10 Essential (primary) hypertension: Secondary | ICD-10-CM

## 2022-07-20 DIAGNOSIS — E1122 Type 2 diabetes mellitus with diabetic chronic kidney disease: Secondary | ICD-10-CM | POA: Diagnosis present

## 2022-07-20 DIAGNOSIS — G894 Chronic pain syndrome: Secondary | ICD-10-CM | POA: Diagnosis present

## 2022-07-20 DIAGNOSIS — I959 Hypotension, unspecified: Secondary | ICD-10-CM | POA: Diagnosis not present

## 2022-07-20 DIAGNOSIS — R296 Repeated falls: Secondary | ICD-10-CM | POA: Diagnosis present

## 2022-07-20 DIAGNOSIS — N3001 Acute cystitis with hematuria: Principal | ICD-10-CM

## 2022-07-20 DIAGNOSIS — Z6841 Body Mass Index (BMI) 40.0 and over, adult: Secondary | ICD-10-CM

## 2022-07-20 DIAGNOSIS — I890 Lymphedema, not elsewhere classified: Secondary | ICD-10-CM

## 2022-07-20 DIAGNOSIS — I5033 Acute on chronic diastolic (congestive) heart failure: Secondary | ICD-10-CM | POA: Diagnosis present

## 2022-07-20 DIAGNOSIS — E119 Type 2 diabetes mellitus without complications: Secondary | ICD-10-CM | POA: Diagnosis present

## 2022-07-20 DIAGNOSIS — Z79899 Other long term (current) drug therapy: Secondary | ICD-10-CM

## 2022-07-20 DIAGNOSIS — R5381 Other malaise: Secondary | ICD-10-CM

## 2022-07-20 DIAGNOSIS — M5090 Cervical disc disorder, unspecified, unspecified cervical region: Secondary | ICD-10-CM | POA: Diagnosis present

## 2022-07-20 DIAGNOSIS — K219 Gastro-esophageal reflux disease without esophagitis: Secondary | ICD-10-CM | POA: Diagnosis present

## 2022-07-20 DIAGNOSIS — M545 Low back pain, unspecified: Secondary | ICD-10-CM | POA: Diagnosis present

## 2022-07-20 DIAGNOSIS — F32A Depression, unspecified: Secondary | ICD-10-CM | POA: Diagnosis present

## 2022-07-20 DIAGNOSIS — E876 Hypokalemia: Secondary | ICD-10-CM | POA: Diagnosis present

## 2022-07-20 DIAGNOSIS — Z91048 Other nonmedicinal substance allergy status: Secondary | ICD-10-CM

## 2022-07-20 DIAGNOSIS — Z8614 Personal history of Methicillin resistant Staphylococcus aureus infection: Secondary | ICD-10-CM

## 2022-07-20 DIAGNOSIS — W19XXXA Unspecified fall, initial encounter: Secondary | ICD-10-CM | POA: Diagnosis present

## 2022-07-20 DIAGNOSIS — E8770 Fluid overload, unspecified: Secondary | ICD-10-CM | POA: Diagnosis present

## 2022-07-20 DIAGNOSIS — N390 Urinary tract infection, site not specified: Secondary | ICD-10-CM

## 2022-07-20 DIAGNOSIS — N1831 Chronic kidney disease, stage 3a: Secondary | ICD-10-CM | POA: Diagnosis present

## 2022-07-20 DIAGNOSIS — Z880 Allergy status to penicillin: Secondary | ICD-10-CM

## 2022-07-20 DIAGNOSIS — Z818 Family history of other mental and behavioral disorders: Secondary | ICD-10-CM

## 2022-07-20 DIAGNOSIS — I872 Venous insufficiency (chronic) (peripheral): Secondary | ICD-10-CM | POA: Diagnosis present

## 2022-07-20 DIAGNOSIS — Z789 Other specified health status: Secondary | ICD-10-CM | POA: Diagnosis present

## 2022-07-20 DIAGNOSIS — Z1611 Resistance to penicillins: Secondary | ICD-10-CM | POA: Diagnosis present

## 2022-07-20 DIAGNOSIS — R531 Weakness: Secondary | ICD-10-CM

## 2022-07-20 DIAGNOSIS — F411 Generalized anxiety disorder: Secondary | ICD-10-CM | POA: Diagnosis present

## 2022-07-20 DIAGNOSIS — Z833 Family history of diabetes mellitus: Secondary | ICD-10-CM

## 2022-07-20 DIAGNOSIS — M519 Unspecified thoracic, thoracolumbar and lumbosacral intervertebral disc disorder: Secondary | ICD-10-CM | POA: Diagnosis present

## 2022-07-20 DIAGNOSIS — L03119 Cellulitis of unspecified part of limb: Secondary | ICD-10-CM | POA: Diagnosis present

## 2022-07-20 DIAGNOSIS — M7989 Other specified soft tissue disorders: Secondary | ICD-10-CM | POA: Diagnosis present

## 2022-07-20 DIAGNOSIS — Z91040 Latex allergy status: Secondary | ICD-10-CM

## 2022-07-20 DIAGNOSIS — N183 Chronic kidney disease, stage 3 unspecified: Secondary | ICD-10-CM | POA: Diagnosis present

## 2022-07-20 DIAGNOSIS — B961 Klebsiella pneumoniae [K. pneumoniae] as the cause of diseases classified elsewhere: Secondary | ICD-10-CM | POA: Diagnosis present

## 2022-07-20 DIAGNOSIS — E669 Obesity, unspecified: Secondary | ICD-10-CM | POA: Diagnosis present

## 2022-07-20 DIAGNOSIS — F1120 Opioid dependence, uncomplicated: Secondary | ICD-10-CM | POA: Diagnosis present

## 2022-07-20 DIAGNOSIS — N1832 Chronic kidney disease, stage 3b: Secondary | ICD-10-CM | POA: Diagnosis present

## 2022-07-20 LAB — COMPREHENSIVE METABOLIC PANEL
ALT: 25 U/L (ref 0–44)
AST: 24 U/L (ref 15–41)
Albumin: 3.6 g/dL (ref 3.5–5.0)
Alkaline Phosphatase: 63 U/L (ref 38–126)
Anion gap: 9 (ref 5–15)
BUN: 31 mg/dL — ABNORMAL HIGH (ref 8–23)
CO2: 29 mmol/L (ref 22–32)
Calcium: 9.1 mg/dL (ref 8.9–10.3)
Chloride: 102 mmol/L (ref 98–111)
Creatinine, Ser: 2.6 mg/dL — ABNORMAL HIGH (ref 0.44–1.00)
GFR, Estimated: 20 mL/min — ABNORMAL LOW (ref >60)
Glucose, Bld: 123 mg/dL — ABNORMAL HIGH (ref 70–99)
Potassium: 5 mmol/L (ref 3.5–5.1)
Sodium: 140 mmol/L (ref 135–145)
Total Bilirubin: 1.4 mg/dL — ABNORMAL HIGH (ref 0.3–1.2)
Total Protein: 7.3 g/dL (ref 6.5–8.1)

## 2022-07-20 LAB — CBC WITH DIFFERENTIAL/PLATELET
Abs Immature Granulocytes: 0.04 10*3/uL (ref 0.00–0.07)
Basophils Absolute: 0.1 10*3/uL (ref 0.0–0.1)
Basophils Relative: 1 %
Eosinophils Absolute: 0.2 10*3/uL (ref 0.0–0.5)
Eosinophils Relative: 2 %
HCT: 37 % (ref 36.0–46.0)
Hemoglobin: 11.9 g/dL — ABNORMAL LOW (ref 12.0–15.0)
Immature Granulocytes: 0 %
Lymphocytes Relative: 25 %
Lymphs Abs: 2.4 10*3/uL (ref 0.7–4.0)
MCH: 30.1 pg (ref 26.0–34.0)
MCHC: 32.2 g/dL (ref 30.0–36.0)
MCV: 93.7 fL (ref 80.0–100.0)
Monocytes Absolute: 0.7 10*3/uL (ref 0.1–1.0)
Monocytes Relative: 7 %
Neutro Abs: 6.3 10*3/uL (ref 1.7–7.7)
Neutrophils Relative %: 65 %
Platelets: 257 10*3/uL (ref 150–400)
RBC: 3.95 MIL/uL (ref 3.87–5.11)
RDW: 14.7 % (ref 11.5–15.5)
WBC: 9.6 10*3/uL (ref 4.0–10.5)
nRBC: 0 % (ref 0.0–0.2)

## 2022-07-20 LAB — LACTIC ACID, PLASMA: Lactic Acid, Venous: 1.3 mmol/L (ref 0.5–1.9)

## 2022-07-20 NOTE — ED Triage Notes (Signed)
Pt arrives via EMS for generalized weakness since Friday. Pt feels like she has cellulitis in bilateral lower legs. They are both weeping fluid. Pt also reports having pain with urination also started Friday. BP 118/92, HR 62

## 2022-07-20 NOTE — ED Provider Notes (Incomplete)
[facility] EMERGENCY DEPARTMENT Provider Note   CSN: [id number] Arrival date & time: 07/20/22  1300     History {Add pertinent medical, surgical, social history, OB history to HPI:1} Chief Complaint  Patient presents with   Weakness    Maria [name] is a 67 y.o. female.  The history is provided by the patient and medical records.   66 year old female with history of CHF, chronic pain, chronic kidney disease, diabetes, hypertension, GERD, hyperlipidemia, lymphedema of the legs, presenting to the ED with generalized weakness.  States for the past week she has had urinary symptoms, specifically frequency and burning with urination.  States she has never had issues like this before.  She denies any fever or chills.  She has not been able to see her doctor about this.  States since Friday she has felt increasingly weak, now to the point she is having difficulty getting around the house.  States her legs have become increasingly more swollen, erythematous, and are weeping clear fluid.  She is concerned she has cellulitis.  She did a E-visit with her NP this morning and was encouraged to come to the ED for evaluation.  States her cardiologist, Dr. Percival Spanish has also had her double her diuretics recently, but in improvement in the swelling.   Home Medications Prior to Admission medications   Medication Sig Start Date End Date Taking? Authorizing Provider  albuterol (PROAIR HFA) 108 (90 Base) MCG/ACT inhaler Inhale 2 puffs into the lungs every 6 (six) hours as needed for wheezing or shortness of breath. 11/05/20   Lucretia Kern, DO  Blood Glucose Monitoring Suppl (ACCU-CHEK GUIDE) w/Device KIT Use as directed Patient taking differently: 1 each by Other route as directed. 04/11/21   Elodia Florence., MD  cholecalciferol (VITAMIN D3) 25 MCG (1000 UNIT) tablet Take 2,000 Units by mouth daily.    [provider]  clonazePAM (KLONOPIN) 1 MG tablet TAKE ONE TABLET BY  MOUTH TWICE DAILY AS NEEDED FOR ANXIETY 06/04/22   Biagio Borg, MD  cyclobenzaprine (FLEXERIL) 10 MG tablet TAKE 1 TABLET BY MOUTH 2 TIMES A DAY AS NEEDED FOR MUSCLE SPASMS Patient taking differently: Take 10 mg by mouth daily as needed. TAKE 1 TABLET BY MOUTH 2 TIMES A DAY AS NEEDED FOR MUSCLE SPASMS 02/27/21   Biagio Borg, MD  FARXIGA 5 MG TABS tablet Take 5 mg by mouth daily. 07/06/22   [provider]  glucose blood (ACCU-CHEK GUIDE) test strip Use as instructed up to 4 times daily 04/11/21   Elodia Florence., MD  ibuprofen (ADVIL) 800 MG tablet Take 800 mg by mouth every 8 (eight) hours as needed. 05/07/22   [provider]  Metamucil Fiber CHEW Chew 3 tablets by mouth at bedtime.    [provider]  metolazone (ZAROXOLYN) 5 MG tablet Take 5 mg by mouth 2 (two) times a week. Per nephrologist    Claudia Desanctis, MD  metoprolol tartrate (LOPRESSOR) 25 MG tablet Take 25 mg by mouth 2 (two) times daily.    [provider]  NARCAN 4 MG/0.1ML LIQD nasal spray kit Place 0.4 mg into the nose daily as needed (opioid overdose). 04/25/19   [provider]  ondansetron (ZOFRAN ODT) 4 MG disintegrating tablet Take 1 tablet (4 mg total) by mouth every 4 (four) hours as needed for nausea or vomiting. 08/23/19   Charlesetta Shanks, MD  Oxycodone HCl 10 MG TABS Take 1 tablet (10 mg total)  4 (four) times daily by mouth. Per [facility] Patient taking differently: Take 10 mg by mouth every 4 (four) hours as needed (pain). Per [facility] 11/08/17   Biagio Borg, MD  pantoprazole (PROTONIX) 40 MG tablet TAKE ONE TABLET BY MOUTH DAILY 01/29/22   Biagio Borg, MD  Polyethyl Glycol-Propyl Glycol 0.4-0.3 % SOLN Apply 1 drop to eye daily as needed (dry eyes).    [provider]  potassium chloride SA (KLOR-CON M) 20 MEQ tablet Take 5 tablets ( 100 mg ) in am and take 3 tablets ( 60 meq ) in pm. 06/30/22   Cleaver, Jossie Ng, NP  pramipexole (MIRAPEX) 0.5  MG tablet Take 1 tablet (0.5 mg total) by mouth 3 (three) times daily. 06/24/22   Biagio Borg, MD  torsemide (DEMADEX) 20 MG tablet Take 2 tablets in the morning and 1 tablet in the afternoon. Patient taking differently: 60mg  twice daily - per nephrologist 12/30/21   Minus Breeding, MD  DULoxetine (CYMBALTA) 60 MG capsule Take 60 mg by mouth daily.    03/09/12  [provider]      Allergies    Amoxicillin-pot clavulanate, Adhesive [tape], Codeine, Crestor [rosuvastatin calcium], Morphine and related, Vicodin [hydrocodone-acetaminophen], and Latex    Review of Systems   Review of Systems  Physical Exam Updated Vital Signs BP (!) 121/53   Pulse 77   Temp 98.1 F (36.7 C) (Oral)   Resp 20   SpO2 100%   Physical Exam Vitals and nursing note reviewed.  Constitutional:      Appearance: She is well-developed.  HENT:     Head: Normocephalic and atraumatic.  Eyes:     Conjunctiva/sclera: Conjunctivae normal.     Pupils: Pupils are equal, round, and reactive to light.  Cardiovascular:     Rate and Rhythm: Normal rate and regular rhythm.     Heart sounds: Normal heart sounds.  Pulmonary:     Effort: Pulmonary effort is normal. No respiratory distress.     Breath sounds: Normal breath sounds. No rhonchi.  Abdominal:     General: Bowel sounds are normal.     Palpations: Abdomen is soft.     Tenderness: There is no abdominal tenderness. There is no rebound.  Musculoskeletal:        General: Normal range of motion.     Cervical back: Normal range of motion.     Comments: Lymphedema of BLE present, there are several open areas to the lower extremities with increased erythema and weeping clear fluid, tender to the touch without tissue crepitus  Skin:    General: Skin is warm and dry.  Neurological:     Mental Status: She is alert and oriented to person, place, and time.         ED Results / Procedures / Treatments   Labs (all labs ordered are listed, but only abnormal  results are displayed) Labs Reviewed  COMPREHENSIVE METABOLIC PANEL - Abnormal; Notable for the following components:      Result Value   Glucose, Bld 123 (*)    BUN 31 (*)    Creatinine, Ser 2.60 (*)    Total Bilirubin 1.4 (*)    GFR, Estimated 20 (*)    All other components within normal limits  CBC WITH DIFFERENTIAL/PLATELET - Abnormal; Notable for the following components:   Hemoglobin 11.9 (*)    All other components within normal limits  URINE CULTURE  LACTIC ACID, PLASMA  LACTIC ACID,  PLASMA  URINALYSIS, ROUTINE W REFLEX MICROSCOPIC    EKG None  Radiology No results found.  Procedures Procedures  {Document cardiac monitor, telemetry assessment procedure when appropriate:1}  Medications Ordered in ED Medications - No data to display  ED Course/ Medical Decision Making/ A&P                           Medical Decision Making Amount and/or Complexity of Data Reviewed Labs: ordered.   ***  {Document critical care time when appropriate:1} {Document review of labs and clinical decision tools ie heart score, Chads2Vasc2 etc:1}  {Document your independent review of radiology images, and any outside records:1} {Document your discussion with family members, caretakers, and with consultants:1} {Document social determinants of health affecting pt's care:1} {Document your decision making why or why not admission, treatments were needed:1} Final Clinical Impression(s) / ED Diagnoses Final diagnoses:  None    Rx / DC Orders ED Discharge Orders     None

## 2022-07-20 NOTE — Progress Notes (Signed)
   We were scheduled to see this pt today at 200pm for enrollment into [facility] which is a home based Palliative care program provided by [facility]. However, pt ended up in hospital before our appointment time.   We will follow along during hospitalization- and assist with d/c plan if needed. Webb Silversmith RN  [phone]

## 2022-07-21 ENCOUNTER — Encounter: Payer: Self-pay | Admitting: Cardiology

## 2022-07-21 DIAGNOSIS — N179 Acute kidney failure, unspecified: Secondary | ICD-10-CM

## 2022-07-21 DIAGNOSIS — K219 Gastro-esophageal reflux disease without esophagitis: Secondary | ICD-10-CM | POA: Diagnosis present

## 2022-07-21 DIAGNOSIS — E8779 Other fluid overload: Secondary | ICD-10-CM

## 2022-07-21 DIAGNOSIS — M79605 Pain in left leg: Secondary | ICD-10-CM | POA: Diagnosis not present

## 2022-07-21 DIAGNOSIS — L03119 Cellulitis of unspecified part of limb: Secondary | ICD-10-CM

## 2022-07-21 DIAGNOSIS — N189 Chronic kidney disease, unspecified: Secondary | ICD-10-CM

## 2022-07-21 DIAGNOSIS — N1832 Chronic kidney disease, stage 3b: Secondary | ICD-10-CM | POA: Diagnosis present

## 2022-07-21 DIAGNOSIS — L03115 Cellulitis of right lower limb: Secondary | ICD-10-CM | POA: Diagnosis present

## 2022-07-21 DIAGNOSIS — Z6841 Body Mass Index (BMI) 40.0 and over, adult: Secondary | ICD-10-CM | POA: Diagnosis not present

## 2022-07-21 DIAGNOSIS — R296 Repeated falls: Secondary | ICD-10-CM | POA: Diagnosis present

## 2022-07-21 DIAGNOSIS — L03116 Cellulitis of left lower limb: Secondary | ICD-10-CM | POA: Diagnosis present

## 2022-07-21 DIAGNOSIS — I959 Hypotension, unspecified: Secondary | ICD-10-CM | POA: Diagnosis not present

## 2022-07-21 DIAGNOSIS — E8770 Fluid overload, unspecified: Secondary | ICD-10-CM | POA: Diagnosis present

## 2022-07-21 DIAGNOSIS — I890 Lymphedema, not elsewhere classified: Secondary | ICD-10-CM | POA: Diagnosis present

## 2022-07-21 DIAGNOSIS — Z1611 Resistance to penicillins: Secondary | ICD-10-CM | POA: Diagnosis present

## 2022-07-21 DIAGNOSIS — J309 Allergic rhinitis, unspecified: Secondary | ICD-10-CM | POA: Diagnosis present

## 2022-07-21 DIAGNOSIS — N3000 Acute cystitis without hematuria: Secondary | ICD-10-CM | POA: Insufficient documentation

## 2022-07-21 DIAGNOSIS — F32A Depression, unspecified: Secondary | ICD-10-CM | POA: Diagnosis present

## 2022-07-21 DIAGNOSIS — N3001 Acute cystitis with hematuria: Secondary | ICD-10-CM | POA: Diagnosis present

## 2022-07-21 DIAGNOSIS — M79604 Pain in right leg: Secondary | ICD-10-CM | POA: Diagnosis not present

## 2022-07-21 DIAGNOSIS — I10 Essential (primary) hypertension: Secondary | ICD-10-CM | POA: Diagnosis not present

## 2022-07-21 DIAGNOSIS — I5033 Acute on chronic diastolic (congestive) heart failure: Secondary | ICD-10-CM

## 2022-07-21 DIAGNOSIS — F411 Generalized anxiety disorder: Secondary | ICD-10-CM | POA: Diagnosis present

## 2022-07-21 DIAGNOSIS — T502X5A Adverse effect of carbonic-anhydrase inhibitors, benzothiadiazides and other diuretics, initial encounter: Secondary | ICD-10-CM | POA: Diagnosis present

## 2022-07-21 DIAGNOSIS — E876 Hypokalemia: Secondary | ICD-10-CM | POA: Diagnosis not present

## 2022-07-21 DIAGNOSIS — N390 Urinary tract infection, site not specified: Secondary | ICD-10-CM | POA: Diagnosis present

## 2022-07-21 DIAGNOSIS — I881 Chronic lymphadenitis, except mesenteric: Secondary | ICD-10-CM | POA: Diagnosis present

## 2022-07-21 DIAGNOSIS — E785 Hyperlipidemia, unspecified: Secondary | ICD-10-CM | POA: Diagnosis present

## 2022-07-21 DIAGNOSIS — G894 Chronic pain syndrome: Secondary | ICD-10-CM | POA: Diagnosis present

## 2022-07-21 DIAGNOSIS — E1122 Type 2 diabetes mellitus with diabetic chronic kidney disease: Secondary | ICD-10-CM | POA: Diagnosis present

## 2022-07-21 DIAGNOSIS — I872 Venous insufficiency (chronic) (peripheral): Secondary | ICD-10-CM | POA: Diagnosis present

## 2022-07-21 DIAGNOSIS — I5031 Acute diastolic (congestive) heart failure: Secondary | ICD-10-CM | POA: Diagnosis not present

## 2022-07-21 DIAGNOSIS — I130 Hypertensive heart and chronic kidney disease with heart failure and stage 1 through stage 4 chronic kidney disease, or unspecified chronic kidney disease: Secondary | ICD-10-CM | POA: Diagnosis present

## 2022-07-21 DIAGNOSIS — W19XXXA Unspecified fall, initial encounter: Secondary | ICD-10-CM | POA: Diagnosis present

## 2022-07-21 DIAGNOSIS — E1151 Type 2 diabetes mellitus with diabetic peripheral angiopathy without gangrene: Secondary | ICD-10-CM | POA: Diagnosis present

## 2022-07-21 LAB — URINALYSIS, ROUTINE W REFLEX MICROSCOPIC
Bilirubin Urine: NEGATIVE
Glucose, UA: 50 mg/dL — AB
Ketones, ur: NEGATIVE mg/dL
Nitrite: NEGATIVE
Protein, ur: NEGATIVE mg/dL
Specific Gravity, Urine: 1.01 (ref 1.005–1.030)
pH: 5 (ref 5.0–8.0)

## 2022-07-21 LAB — CBC WITH DIFFERENTIAL/PLATELET
Abs Immature Granulocytes: 0.02 10*3/uL (ref 0.00–0.07)
Basophils Absolute: 0.1 10*3/uL (ref 0.0–0.1)
Basophils Relative: 1 %
Eosinophils Absolute: 0.2 10*3/uL (ref 0.0–0.5)
Eosinophils Relative: 2 %
HCT: 34 % — ABNORMAL LOW (ref 36.0–46.0)
Hemoglobin: 11.1 g/dL — ABNORMAL LOW (ref 12.0–15.0)
Immature Granulocytes: 0 %
Lymphocytes Relative: 25 %
Lymphs Abs: 2.3 10*3/uL (ref 0.7–4.0)
MCH: 30.2 pg (ref 26.0–34.0)
MCHC: 32.6 g/dL (ref 30.0–36.0)
MCV: 92.4 fL (ref 80.0–100.0)
Monocytes Absolute: 0.7 10*3/uL (ref 0.1–1.0)
Monocytes Relative: 7 %
Neutro Abs: 6 10*3/uL (ref 1.7–7.7)
Neutrophils Relative %: 65 %
Platelets: 240 10*3/uL (ref 150–400)
RBC: 3.68 MIL/uL — ABNORMAL LOW (ref 3.87–5.11)
RDW: 14.6 % (ref 11.5–15.5)
WBC: 9.2 10*3/uL (ref 4.0–10.5)
nRBC: 0 % (ref 0.0–0.2)

## 2022-07-21 LAB — BASIC METABOLIC PANEL
Anion gap: 10 (ref 5–15)
BUN: 26 mg/dL — ABNORMAL HIGH (ref 8–23)
CO2: 28 mmol/L (ref 22–32)
Calcium: 8.6 mg/dL — ABNORMAL LOW (ref 8.9–10.3)
Chloride: 100 mmol/L (ref 98–111)
Creatinine, Ser: 1.92 mg/dL — ABNORMAL HIGH (ref 0.44–1.00)
GFR, Estimated: 29 mL/min — ABNORMAL LOW (ref >60)
Glucose, Bld: 124 mg/dL — ABNORMAL HIGH (ref 70–99)
Potassium: 3.7 mmol/L (ref 3.5–5.1)
Sodium: 138 mmol/L (ref 135–145)

## 2022-07-21 LAB — CBG MONITORING, ED: Glucose-Capillary: 111 mg/dL — ABNORMAL HIGH (ref 70–99)

## 2022-07-21 LAB — GLUCOSE, CAPILLARY
Glucose-Capillary: 99 mg/dL (ref 70–99)
Glucose-Capillary: 97 mg/dL (ref 70–99)
Glucose-Capillary: 144 mg/dL — ABNORMAL HIGH (ref 70–99)
Glucose-Capillary: 145 mg/dL — ABNORMAL HIGH (ref 70–99)
Glucose-Capillary: 152 mg/dL — ABNORMAL HIGH (ref 70–99)

## 2022-07-21 LAB — HIV ANTIBODY (ROUTINE TESTING W REFLEX): HIV Screen 4th Generation wRfx: REACTIVE — AB

## 2022-07-21 LAB — MAGNESIUM: Magnesium: 1.9 mg/dL (ref 1.7–2.4)

## 2022-07-21 MED ORDER — SODIUM CHLORIDE 0.9 % IV SOLN
2.0000 g | INTRAVENOUS | Status: DC
  Administered 2022-07-21 – 2022-07-22 (×2): 2 g via INTRAVENOUS
  Filled 2022-07-21 (×2): qty 20

## 2022-07-21 MED ORDER — METOPROLOL TARTRATE 25 MG PO TABS
25.0000 mg | ORAL_TABLET | Freq: Two times a day (BID) | ORAL | Status: DC
  Administered 2022-07-21 – 2022-07-29 (×11): 25 mg via ORAL
  Filled 2022-07-21 (×16): qty 1

## 2022-07-21 MED ORDER — INSULIN ASPART 100 UNIT/ML IJ SOLN: 0.0000 [IU] | Freq: Every day | INTRAMUSCULAR | Status: DC

## 2022-07-21 MED ORDER — VANCOMYCIN VARIABLE DOSE PER UNSTABLE RENAL FUNCTION (PHARMACIST DOSING): Status: DC

## 2022-07-21 MED ORDER — HEPARIN SODIUM (PORCINE) 5000 UNIT/ML IJ SOLN
5000.0000 [IU] | Freq: Three times a day (TID) | INTRAMUSCULAR | Status: DC
  Administered 2022-07-21 – 2022-07-29 (×26): 5000 [IU] via SUBCUTANEOUS
  Filled 2022-07-21 (×26): qty 1

## 2022-07-21 MED ORDER — VANCOMYCIN HCL 10 G IV SOLR
2500.0000 mg | Freq: Once | INTRAVENOUS | Status: AC
  Administered 2022-07-21: 2500 mg via INTRAVENOUS
  Filled 2022-07-21: qty 25

## 2022-07-21 MED ORDER — FENTANYL CITRATE PF 50 MCG/ML IJ SOSY
50.0000 ug | PREFILLED_SYRINGE | Freq: Once | INTRAMUSCULAR | Status: AC
  Administered 2022-07-21: 50 ug via INTRAVENOUS
  Filled 2022-07-21: qty 1

## 2022-07-21 MED ORDER — CLOTRIMAZOLE 1 % EX CREA
TOPICAL_CREAM | Freq: Two times a day (BID) | CUTANEOUS | Status: DC
  Administered 2022-07-21 – 2022-07-23 (×2): 1 via TOPICAL
  Filled 2022-07-21 (×4): qty 15

## 2022-07-21 MED ORDER — PRAMIPEXOLE DIHYDROCHLORIDE 0.25 MG PO TABS
0.5000 mg | ORAL_TABLET | Freq: Three times a day (TID) | ORAL | Status: DC
Start: 2022-07-21 — End: 2022-07-29
  Administered 2022-07-21 – 2022-07-29 (×26): 0.5 mg via ORAL
  Filled 2022-07-21 (×29): qty 2

## 2022-07-21 MED ORDER — FUROSEMIDE 10 MG/ML IJ SOLN
40.0000 mg | Freq: Two times a day (BID) | INTRAMUSCULAR | Status: DC
  Administered 2022-07-21 – 2022-07-29 (×18): 40 mg via INTRAVENOUS
  Filled 2022-07-21 (×18): qty 4

## 2022-07-21 MED ORDER — ONDANSETRON HCL 4 MG PO TABS: 4.0000 mg | ORAL_TABLET | Freq: Four times a day (QID) | ORAL | Status: DC | PRN

## 2022-07-21 MED ORDER — INSULIN ASPART 100 UNIT/ML IJ SOLN
0.0000 [IU] | Freq: Three times a day (TID) | INTRAMUSCULAR | Status: DC
  Administered 2022-07-22 – 2022-07-29 (×7): 2 [IU] via SUBCUTANEOUS

## 2022-07-21 MED ORDER — SODIUM CHLORIDE 0.9 % IV SOLN
2.0000 g | Freq: Once | INTRAVENOUS | Status: AC
  Administered 2022-07-21: 2 g via INTRAVENOUS
  Filled 2022-07-21: qty 20

## 2022-07-21 MED ORDER — SENNOSIDES-DOCUSATE SODIUM 8.6-50 MG PO TABS: 1.0000 | ORAL_TABLET | Freq: Every evening | ORAL | Status: DC | PRN

## 2022-07-21 MED ORDER — METOPROLOL TARTRATE 5 MG/5ML IV SOLN: 5.0000 mg | Freq: Four times a day (QID) | INTRAVENOUS | Status: DC | PRN

## 2022-07-21 MED ORDER — PRAMIPEXOLE DIHYDROCHLORIDE 0.25 MG PO TABS
0.5000 mg | ORAL_TABLET | Freq: Three times a day (TID) | ORAL | Status: DC
  Filled 2022-07-21: qty 2

## 2022-07-21 MED ORDER — ACETAMINOPHEN 325 MG PO TABS
650.0000 mg | ORAL_TABLET | Freq: Four times a day (QID) | ORAL | Status: DC | PRN
  Administered 2022-07-24: 650 mg via ORAL
  Filled 2022-07-21: qty 2

## 2022-07-21 MED ORDER — ONDANSETRON HCL 4 MG/2ML IJ SOLN: 4.0000 mg | Freq: Four times a day (QID) | INTRAMUSCULAR | Status: DC | PRN

## 2022-07-21 MED ORDER — ACETAMINOPHEN 650 MG RE SUPP: 650.0000 mg | Freq: Four times a day (QID) | RECTAL | Status: DC | PRN

## 2022-07-21 MED ORDER — PANTOPRAZOLE SODIUM 40 MG PO TBEC
40.0000 mg | DELAYED_RELEASE_TABLET | Freq: Every day | ORAL | Status: DC
  Administered 2022-07-21 – 2022-07-29 (×9): 40 mg via ORAL
  Filled 2022-07-21 (×9): qty 1

## 2022-07-21 MED ORDER — HYDROMORPHONE HCL 1 MG/ML IJ SOLN
1.0000 mg | INTRAMUSCULAR | Status: DC | PRN
  Administered 2022-07-21 – 2022-07-29 (×42): 1 mg via INTRAVENOUS
  Filled 2022-07-21 (×43): qty 1

## 2022-07-21 NOTE — Progress Notes (Signed)
PROGRESS NOTE  Brief Narrative: Maria [name] is a 66 y.o. female with a history of lymphedema, morbid obesity, stage IIIa CKD, chronic HFpEF (LVEF 65-70%, G2DD, normal size/function of RV on echo Oct 2022), T2DM, dyslipidemia who presented to the ED with progressive painful swelling in legs despite augmenting oral diuretic, and dysuria. She was diagnosed with cellulitis and UTI, started vancomycin and ceftriaxone, as well as IV lasix, and admitted this morning by Dr. Claria Dice.   Subjective: No real change since admission (seen a couple hours after admission). Speaking with sisters by phone. States she's been getting weaker and falling at home. +reflux symptoms.  Objective: BP (!) 131/49 (BP Location: Left Arm)   Pulse 75   Temp 98.9 F (37.2 C) (Oral)   Resp 20   SpO2 98%   Gen: Obese, pleasant female in no acute distress Pulm: Clear and nonlabored on room air  CV: Regular, no murmur, +diffuse and dependent edema.  GI: Slight suprapubic discomfort to deep palpation.  Neuro: Alert and oriented. No focal deficits. Skin: Bilateral lower legs with weeping tender erythema. No fluctuance or exudate noted.  Assessment & Plan: - Continue broad coverage for UTI and cellulitis with vancomycin, and ceftriaxone pending blood and urine culture data.  - See H&P for full details of plan. - Add PT/OT evaluations for falling at home.   Patrecia Pour, MD Pager on amion 07/21/2022, 1:54 PM

## 2022-07-21 NOTE — Consult Note (Signed)
Cardiology Consultation:   Patient ID: Maria [name]; [id number]; [birth year]   Admit date: 07/20/2022 Date of Consult: 07/21/2022  Primary Care Provider: Carson Myrtle, NP Primary Cardiologist: Minus Breeding, MD  Primary Electrophysiologist:  None   Patient Profile:   Maria [name] is a 66 y.o. female with a hx of chronic diastolic  who is being seen today for the evaluation of leg swelling at the request of Dr. Bonner Puna.  History of Present Illness:   Maria [name]  presents for evaluation of lower extremity pain and swelling as well as groin pain with painful urination..  She has a long history of  follow up of acute on chronic diastolic dysfunction.    We saw her most recently in July of this year.  We have continued to have multiple med adjustments for continued weight gain.  It is important to note that despite extensive education she was noted to be drinking "90 ounces" daily of fluid at the last visit.    She is being admitted today and being managed for bilateral leg cellulitis and dysuria.  Of note her discharge weight at the last admission in January was 365 lbs.  She was 361 in the office.  Lowest recent appears to be about 358.    The patient says that she is unable to keep her feet elevated during the day because she has kept bathroom so much.  She ambulates minimally in her home.  She reports that she actually restricts her fluid.  She says she is careful with her salt.  She is adherent to her medications.  She says her most recent weight was actually 354 pounds although I do not see a weight this admission.  She did come in with acute on chronic renal insufficiency with a creatinine up to 2.6 although it is down today.  She has been getting 40 mg IV twice daily of Lasix.  I do not see any recorded intake and output.  She actually denies any acute shortness of breath.  She is not describing new PND or orthopnea.  She sleeps chronically in a recliner.  She is not describing new  palpitations, presyncope or syncope.  She not describing any chest pain.   Past Medical History:  Diagnosis Date   Acute lymphadenitis 2011   ALLERGIC RHINITIS 08/10/2007   ANXIETY 12/06/2007   Atherosclerotic peripheral vascular disease ([place]) 06/13/2013   Aorta on CT June 2014   Cellulitis 2011   Cervical disc disease 03/09/2012   CHF (congestive heart failure) ([place])    Chronic pain 03/09/2012   DEPRESSION 12/06/2007   Diabetes ([place])    DIVERTICULOSIS, [place] 12/06/2007   GERD 12/06/2007   Hepatitis    age 3 hepatitis A   HLD (hyperlipidemia) 05/24/2019   HYPERTENSION 12/06/2007   Lumbar disc disease 03/09/2012   Mesenteric adenitis    MRSA 2006   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 2010   [place]  may 24th 2010   COLONOSCOPY WITH PROPOFOL N/A 07/10/2016   Procedure: COLONOSCOPY WITH PROPOFOL;  Surgeon: Manus Gunning, MD;  Location: [facility];  Service: Gastroenterology;  Laterality: N/A;   COLONOSCOPY WITH PROPOFOL N/A 09/26/2019   Procedure: COLONOSCOPY WITH PROPOFOL;  Surgeon: Yetta Flock, MD;  Location: [facility];  Service: Gastroenterology;  Laterality: N/A;   ESOPHAGOGASTRODUODENOSCOPY (EGD) WITH PROPOFOL N/A 07/10/2016   Procedure: ESOPHAGOGASTRODUODENOSCOPY (EGD) WITH PROPOFOL;  Surgeon: Manus Gunning, MD;  Location: [facility];  Service: Gastroenterology;  Laterality: N/A;   POLYPECTOMY  09/26/2019   Procedure: POLYPECTOMY;  Surgeon: Yetta Flock, MD;  Location: [facility];  Service: Gastroenterology;;   s/p ovary cyst     s/p right knee arthroscopy     Dr. Mardelle Matte ortho     Home Medications:  Prior to Admission medications   Medication Sig Start Date End Date Taking? Authorizing Provider  albuterol  (PROAIR HFA) 108 (90 Base) MCG/ACT inhaler Inhale 2 puffs into the lungs every 6 (six) hours as needed for wheezing or shortness of breath. 11/05/20  Yes Colin Benton R, DO  cholecalciferol (VITAMIN D3) 25 MCG (1000 UNIT) tablet Take 2,000 Units by mouth daily.   Yes [provider]  clonazePAM (KLONOPIN) 1 MG tablet TAKE ONE TABLET BY MOUTH TWICE DAILY AS NEEDED FOR ANXIETY Patient taking differently: Take 1 mg by mouth 2 (two) times daily as needed for anxiety. 06/04/22  Yes Biagio Borg, MD  cyclobenzaprine (FLEXERIL) 10 MG tablet TAKE 1 TABLET BY MOUTH 2 TIMES A DAY AS NEEDED FOR MUSCLE SPASMS Patient taking differently: Take 10 mg by mouth daily as needed. TAKE 1 TABLET BY MOUTH 2 TIMES A DAY AS NEEDED FOR MUSCLE SPASMS 02/27/21  Yes Biagio Borg, MD  FARXIGA 5 MG TABS tablet Take 5 mg by mouth daily. 07/06/22  Yes [provider]  ibuprofen (ADVIL) 800 MG tablet Take 800 mg by mouth every 8 (eight) hours as needed for headache. 05/07/22  Yes [provider]  Metamucil Fiber CHEW Chew 3 tablets by mouth at bedtime.   Yes [provider]  metolazone (ZAROXOLYN) 5 MG tablet Take 5 mg by mouth 2 (two) times a week. Per nephrologist on Tuesday and Friday   Yes Claudia Desanctis, MD  metoprolol tartrate (LOPRESSOR) 25 MG tablet Take 25 mg by mouth 2 (two) times daily.   Yes [provider]  ondansetron (ZOFRAN ODT) 4 MG disintegrating tablet Take 1 tablet (4 mg total) by mouth every 4 (four) hours as needed for nausea or vomiting. 08/23/19  Yes Charlesetta Shanks, MD  Oxycodone HCl 10 MG TABS Take 1 tablet (10 mg total) 4 (four) times daily by mouth. Per [facility] Patient taking differently: Take 10 mg by mouth every 4 (four) hours as needed (pain). Per [facility] 11/08/17  Yes Biagio Borg, MD  pantoprazole (PROTONIX) 40 MG tablet TAKE ONE TABLET BY MOUTH DAILY Patient taking differently: Take 40 mg by mouth daily. 01/29/22  Yes Biagio Borg, MD   Polyethyl Glycol-Propyl Glycol 0.4-0.3 % SOLN Apply 1 drop to eye daily as needed (dry eyes).   Yes [provider]  potassium chloride SA (KLOR-CON M) 20 MEQ tablet Take 80 mEq by mouth See admin instructions. Take 4 tablets ( 75m) by mouth twice a day as needed for potassium levels are low per Nephrologist 06/30/22  Yes Cleaver, JJossie Ng NP  pramipexole (MIRAPEX) 0.5 MG tablet Take 1 tablet (0.5 mg total) by mouth 3 (three) times daily. 06/24/22  Yes JBiagio Borg MD  torsemide (DEMADEX) 20 MG tablet Take 2 tablets in the morning and 1 tablet in the afternoon. Patient taking differently: Take 20-60 mg by mouth See admin instructions. Take 3 tablets by mouth in the morning  then take 1 tablet in the afternoon - per nephrologist 12/30/21  Yes Minus Breeding, MD  Blood Glucose Monitoring Suppl (ACCU-CHEK GUIDE) w/Device KIT Use as directed Patient taking differently: 1 each by Other route as directed. 04/11/21   Elodia Florence., MD  glucose blood (ACCU-CHEK GUIDE) test strip Use as instructed up to 4 times daily 04/11/21   Elodia Florence., MD  [facility] 4 MG/0.1ML LIQD nasal spray kit Place 0.4 mg into the nose daily as needed (opioid overdose). Patient not taking: Reported on 07/21/2022 04/25/19   [provider]  DULoxetine (CYMBALTA) 60 MG capsule Take 60 mg by mouth daily.    03/09/12  [provider]    Inpatient Medications: Scheduled Meds:  clotrimazole   Topical BID   furosemide  40 mg Intravenous Q12H   heparin  5,000 Units Subcutaneous Q8H   insulin aspart  0-15 Units Subcutaneous TID WC   insulin aspart  0-5 Units Subcutaneous QHS   metoprolol tartrate  25 mg Oral BID   pantoprazole  40 mg Oral Daily   pramipexole  0.5 mg Oral TID   vancomycin variable dose per unstable renal function (pharmacist dosing)   Does not apply See admin instructions   Continuous Infusions:  cefTRIAXone (ROCEPHIN)  IV     PRN Meds: acetaminophen **OR** acetaminophen,  HYDROmorphone (DILAUDID) injection, metoprolol tartrate, ondansetron **OR** ondansetron (ZOFRAN) IV, senna-docusate  Allergies:    Allergies  Allergen Reactions   Amoxicillin-Pot Clavulanate Nausea And Vomiting    Projectile vomiting Did it involve swelling of the face/tongue/throat, SOB, or low BP? No Did it involve sudden or severe rash/hives, skin peeling, or any reaction on the inside of your mouth or nose? No Did you need to seek medical attention at a hospital or doctor's office? No When did it last happen?      20-30 years If all above answers are "NO", may proceed with cephalosporin use.    Adhesive [Tape] Other (See Comments)    Tears skin off - use paper tape    Codeine Other (See Comments)    hallucinations   Crestor [Rosuvastatin Calcium]     Severe muscle cramps   Morphine And Related Nausea And Vomiting and Other (See Comments)    Migraine headaches   Vicodin [Hydrocodone-Acetaminophen] Other (See Comments)    hallucinations   Latex Rash    Social History:   Social History   Socioeconomic History   Marital status: Divorced    Spouse name: Not on file   Number of children: 2   Years of education: Not on file   Highest education level: Not on file  Occupational History   Occupation: disabled  Tobacco Use   Smoking status: Former    Years: 30.00    Types: Cigarettes    Quit date: 11/08/2008    Years since quitting: 13.7   Smokeless tobacco: Never   Tobacco comments:    quit 10/09  Vaping Use   Vaping Use: Never used  Substance and Sexual Activity   Alcohol use: No    Comment: quit drinking 10/07/1997   Drug use: No   Sexual activity: Not Currently    Birth control/protection: Surgical, Abstinence  Other Topics Concern   Not on file  Social History Narrative   Patient has difficulty financially affording food, medication, and affording essential bills. She states she has limited support from her daughter.    Social Determinants of Health    Financial Resource Strain: Medium Risk (  12/30/2021)   Overall Financial Resource Strain (CARDIA)    Difficulty of Paying Living Expenses: Somewhat hard  Food Insecurity: No Food Insecurity (04/03/2022)   Hunger Vital Sign    Worried About Running Out of Food in the Last Year: Never true    [place] in the Last Year: Never true  Transportation Needs: Unmet Transportation Needs (04/22/2022)   PRAPARE - Transportation    Lack of Transportation (Medical): No    Lack of Transportation (Non-Medical): Yes  Physical Activity: Inactive (12/30/2021)   Exercise Vital Sign    Days of Exercise per Week: 0 days    Minutes of Exercise per Session: 0 min  Stress: No Stress Concern Present (12/30/2021)   [place]    Feeling of Stress : Not at all  Social Connections: Socially Isolated (12/30/2021)   Social Connection and Isolation Panel [NHANES]    Frequency of Communication with Friends and Family: More than three times a week    Frequency of Social Gatherings with Friends and Family: More than three times a week    Attends Religious Services: Never    Marine scientist or Organizations: No    Attends Archivist Meetings: Never    Marital Status: Never married  Intimate Partner Violence: Not At Risk (12/30/2021)   Humiliation, Afraid, Rape, and Kick questionnaire    Fear of Current or Ex-Partner: No    Emotionally Abused: No    Physically Abused: No    Sexually Abused: No    Family History:    Family History  Problem Relation Age of Onset   Heart disease Mother    Hypertension Mother    Diabetes Mother    Heart failure Mother    Asthma Sister    Anxiety disorder Sister    Depression Sister    Hypertension Father    Asthma Daughter    Bipolar disorder Daughter    Cancer Maternal Uncle        [place]   Cancer Other        ovarian   Liver cancer Paternal Grandmother        ????     ROS:  Please  see the history of present illness.  ROS  All other ROS reviewed and negative.     Physical Exam/Data:   Vitals:   07/21/22 0745 07/21/22 0800 07/21/22 0830 07/21/22 1049  BP: (!) 126/54 117/66 (!) 108/57 (!) 131/49  Pulse: 72 78 73 75  Resp:    20  Temp:    98.9 F (37.2 C)  TempSrc:    Oral  SpO2: 100% 95% 95% 98%   No intake or output data in the 24 hours ending 07/21/22 1339 There were no vitals filed for this visit. There is no height or weight on file to calculate BMI.  GENERAL: Well appearing HEENT:   Pupils equal round and reactive, fundi not visualized, oral mucosa unremarkable NECK:  No  jugular venous distention, waveform within normal limits, carotid upstroke brisk and symmetric, no bruits, no thyromegaly LYMPHATICS:  No cervical, inguinal adenopathy LUNGS:   Clear to auscultation bilaterally BACK:  No CVA tenderness CHEST:   Unremarkable HEART:  PMI not displaced or sustained,S1 and S2 within normal limits, no S3, no S4, no clicks, no rubs, no murmurs ABD:  Flat, positive bowel sounds normal in frequency in pitch, no bruits, no rebound, no guarding, no midline pulsatile mass, no hepatomegaly, no splenomegaly  EXT:  2 plus pulses throughout, moderate bilateral lower extremity edema, no cyanosis no clubbing SKIN:  No rashes no nodules, chronic venous stasis changes with some erythema both lower legs NEURO:   Cranial nerves II through XII grossly intact, motor grossly intact throughout PSYCH:    Cognitively intact, oriented to person place and time   EKG:  The EKG was personally reviewed and demonstrates:  NA Telemetry:  Telemetry was personally reviewed and demonstrates:  NA  Relevant CV Studies:  Echo:  10/23  1. Left ventricular ejection fraction, by estimation, is 65 to 70%. The  left ventricle has normal function. The left ventricle has no regional  wall motion abnormalities. Left ventricular diastolic parameters are  consistent with Grade II diastolic   dysfunction (pseudonormalization). Elevated left ventricular end-diastolic  pressure.   2. Right ventricular systolic function is normal. The right ventricular  size is normal. Tricuspid regurgitation signal is inadequate for assessing  PA pressure.   3. Left atrial size was upper normal.   4. The mitral valve is grossly normal. No evidence of mitral valve  regurgitation.   5. The aortic valve is tricuspid. Aortic valve regurgitation is not  visualized.   6. The inferior vena cava is dilated in size with >50% respiratory  variability, suggesting right atrial pressure of 8 mmHg.   Laboratory Data:  Chemistry Recent Labs  Lab 07/17/22 1536 07/20/22 1429 07/21/22 0550  NA 140 140 138  K 5.7* 5.0 3.7  CL 102 102 100  CO2 19* 29 28  GLUCOSE 116* 123* 124*  BUN 31* 31* 26*  CREATININE 2.07* 2.60* 1.92*  CALCIUM 9.1 9.1 8.6*  GFRNONAA  --  20* 29*  ANIONGAP  --  9 10    Recent Labs  Lab 07/20/22 1429  PROT 7.3  ALBUMIN 3.6  AST 24  ALT 25  ALKPHOS 63  BILITOT 1.4*   Hematology Recent Labs  Lab 07/20/22 1429 07/21/22 0550  WBC 9.6 9.2  RBC 3.95 3.68*  HGB 11.9* 11.1*  HCT 37.0 34.0*  MCV 93.7 92.4  MCH 30.1 30.2  MCHC 32.2 32.6  RDW 14.7 14.6  PLT 257 240   Cardiac EnzymesNo results for input(s): "TROPONINI" in the last 168 hours. No results for input(s): "TROPIPOC" in the last 168 hours.  BNPNo results for input(s): "BNP", "PROBNP" in the last 168 hours.  DDimer No results for input(s): "DDIMER" in the last 168 hours.  Radiology/Studies:  No results found.  Assessment and Plan:   Acute on chronic diastolic HF: She does have acute on chronic diastolic heart failure but this is almost all right-sided probably more related to venous insufficiency.  She says that it is improved greatly overnight.  We again discussed the need to keep the feet elevated particularly at home.  She reports being adherent to salt and fluid.  This has continued to be a challenge and  follow-up has been difficult because of mobility issues.  We have tried home consultation but that has been sporadic and not particularly helpful.  Perhaps we can establish more consistent with virtual visits and home lab draws.  For now I would continue the diuretics as listed.  HTN: Her blood pressure is well controlled.  Continue meds as listed.  CKD IIIB:  Creat is elevated above baseline but this not unusual for her presentations.  Her baseline creat can be in the 1.1 range but was 2.6 on admission.  Down today.  Follow creatinine     For  questions or updates, please contact [facility] Please consult [email or web address] for contact info under Cardiology/STEMI.   Signed, Minus Breeding, MD  07/21/2022 1:39 PM

## 2022-07-21 NOTE — Social Work (Signed)
H&V Care Navigation CSW Progress Note  Outpatient Clinical Social Worker  contacted inpatient [facility] team RNCM Sharyn Lull  to provide information about outpatient coordination. Pt is active with [facility], she has had persistant mobility challenges, usually coming with walker/cane and not able to ambulate far at appts. She lives home alone with some minimal assistance from her sister, daughter lives in [place]. LCSW awaits PT/OT evals but let Sharyn Lull know that SNF may be recommended based on what I have seen/discussed with pt. Pt does not have Medicaid at this time, only [facility] Medicare. Pt may benefit from strengthening/assistance at SNF due to limited supervision at home/ongoing mobility challenges. Provided my number to Sharyn Lull should I be of any further assistance for planning for pt discharge plan on the outpatient side.   Patient is participating in a Managed Medicaid Plan:  No, UHC Medicare only.   SDOH Screenings   Alcohol Screen: Low Risk  (12/30/2021)   Alcohol Screen    Last Alcohol Screening Score (AUDIT): 0  Depression (PHQ2-9): Medium Risk (01/30/2022)   Depression (PHQ2-9)    PHQ-2 Score: 9  Financial Resource Strain: Medium Risk (12/30/2021)   Overall Financial Resource Strain (CARDIA)    Difficulty of Paying Living Expenses: Somewhat hard  Food Insecurity: No Food Insecurity (04/03/2022)   Hunger Vital Sign    Worried About Running Out of Food in the Last Year: Never true    Ran Out of Food in the Last Year: Never true  Housing: Low Risk  (04/03/2022)   Housing    Last Housing Risk Score: 0  Physical Activity: Inactive (12/30/2021)   Exercise Vital Sign    Days of Exercise per Week: 0 days    Minutes of Exercise per Session: 0 min  Social Connections: Socially Isolated (12/30/2021)   Social Connection and Isolation Panel [NHANES]    Frequency of Communication with Friends and Family: More than three times a week    Frequency of Social Gatherings with Friends and Family: More than  three times a week    Attends Religious Services: Never    Marine scientist or Organizations: No    Attends Archivist Meetings: Never    Marital Status: Never married  Stress: No Stress Concern Present (12/30/2021)   [employer] [place] of Stress : Not at all  Tobacco Use: Medium Risk (07/21/2022)   Patient History    Smoking Tobacco Use: Former    Smokeless Tobacco Use: Never    Passive Exposure: Not on file  Transportation Needs: Unmet Transportation Needs (04/22/2022)   PRAPARE - Hydrologist (Medical): No    Lack of Transportation (Non-Medical): Yes   Westley Hummer, MSW, [place]  [phone]- work cell phone (preferred) [phone]- desk phone

## 2022-07-21 NOTE — ED Notes (Signed)
Report given to Romilda Garret RN via [employer]. Patient placed in ED room 8

## 2022-07-21 NOTE — Progress Notes (Signed)
Pharmacy Antibiotic Note  Martita Brumm Levitz is a 66 y.o. female admitted on 07/20/2022 with weakness, concern of UTI/cellulitis.  Pharmacy has been consulted for vancomycin dosing. Scr up to 2.6 with B ~1.1.   Plan: Vancomycin '2500mg'$  x1 loading dose  Vancomycin by random level dosing due to AKI Obtain vancomycin levels as appropriate  Monitor renal function, cultures, and clinical progression      Temp (24hrs), Avg:98 F (36.7 C), Min:97.5 F (36.4 C), Max:98.5 F (36.9 C)  Recent Labs  Lab 07/17/22 1536 07/20/22 1429  WBC  --  9.6  CREATININE 2.07* 2.60*  LATICACIDVEN  --  1.3    Estimated Creatinine Clearance: 33 mL/min (A) (by C-G formula based on SCr of 2.6 mg/dL (H)).    Allergies  Allergen Reactions   Amoxicillin-Pot Clavulanate Nausea And Vomiting    Projectile vomiting Did it involve swelling of the face/tongue/throat, SOB, or low BP? No Did it involve sudden or severe rash/hives, skin peeling, or any reaction on the inside of your mouth or nose? No Did you need to seek medical attention at a hospital or doctor's office? No When did it last happen?      20-30 years If all above answers are "NO", may proceed with cephalosporin use.    Adhesive [Tape] Other (See Comments)    Tears skin off - use paper tape    Codeine Other (See Comments)    hallucinations   Crestor [Rosuvastatin Calcium]     Severe muscle cramps   Morphine And Related Nausea And Vomiting and Other (See Comments)    Migraine headaches   Vicodin [Hydrocodone-Acetaminophen] Other (See Comments)    hallucinations   Latex Rash    Antimicrobials this admission: Vancomycin 8/1 >>  Ceftriaxone 8/1 >>  Dose adjustments this admission:  Microbiology results: 8/1 ucx:  Thank you for allowing pharmacy to be a part of this patient's care.  Cristela Felt, PharmD, BCPS Clinical Pharmacist 07/21/2022 3:59 AM

## 2022-07-21 NOTE — ED Notes (Signed)
Breakfast order placed ?

## 2022-07-21 NOTE — ED Notes (Signed)
ED TO INPATIENT HANDOFF REPORT  ED Nurse Name and Phone #: [phone]  S Name/Age/Gender Maria [name] 66 y.o. female Room/Bed: 008C/008C  Code Status   Code Status: Full Code  Home/SNF/Other Home Patient oriented to: self, place, time, and situation Is this baseline? Yes   Triage Complete: Triage complete  Chief Complaint Fluid overload [E87.70]  Triage Note Pt arrives via EMS for generalized weakness since Friday. Pt feels like she has cellulitis in bilateral lower legs. They are both weeping fluid. Pt also reports having pain with urination also started Friday. BP 118/92, HR 62   Allergies Allergies  Allergen Reactions   Amoxicillin-Pot Clavulanate Nausea And Vomiting    Projectile vomiting Did it involve swelling of the face/tongue/throat, SOB, or low BP? No Did it involve sudden or severe rash/hives, skin peeling, or any reaction on the inside of your mouth or nose? No Did you need to seek medical attention at a hospital or doctor's office? No When did it last happen?      20-30 years If all above answers are "NO", may proceed with cephalosporin use.    Adhesive [Tape] Other (See Comments)    Tears skin off - use paper tape    Codeine Other (See Comments)    hallucinations   Crestor [Rosuvastatin Calcium]     Severe muscle cramps   Morphine And Related Nausea And Vomiting and Other (See Comments)    Migraine headaches   Vicodin [Hydrocodone-Acetaminophen] Other (See Comments)    hallucinations   Latex Rash    Level of Care/Admitting Diagnosis ED Disposition     ED Disposition  Admit   Condition  --   [place]: [place] [[id number]]  Level of Care: Med-Surg [16]  May admit patient to [facility] or [facility] if equivalent level of care is available:: Yes  Covid Evaluation: Confirmed COVID Negative  Diagnosis: Fluid overload [[id number]]  Admitting Physician: [place], [place]  Attending Physician: Quintella Baton  [[id number]]  Certification:: I certify this patient will need inpatient services for at least 2 midnights  Estimated Length of Stay: 2          B Medical/Surgery History Past Medical History:  Diagnosis Date   Acute lymphadenitis 2011   ALLERGIC RHINITIS 08/10/2007   ANXIETY 12/06/2007   Atherosclerotic peripheral vascular disease ([place]) 06/13/2013   Aorta on CT June 2014   Cellulitis 2011   Cervical disc disease 03/09/2012   CHF (congestive heart failure) ([place])    Chronic pain 03/09/2012   DEPRESSION 12/06/2007   Diabetes ([place])    DIVERTICULOSIS, [place] 12/06/2007   GERD 12/06/2007   Hepatitis    age 24 hepatitis A   HLD (hyperlipidemia) 05/24/2019   HYPERTENSION 12/06/2007   Lumbar disc disease 03/09/2012   Mesenteric adenitis    MRSA 2006   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 2010   [place]  may 24th 2010   COLONOSCOPY WITH PROPOFOL N/A 07/10/2016   Procedure: COLONOSCOPY WITH PROPOFOL;  Surgeon: Manus Gunning, MD;  Location: [facility];  Service: Gastroenterology;  Laterality: N/A;   COLONOSCOPY WITH PROPOFOL N/A 09/26/2019   Procedure: COLONOSCOPY WITH PROPOFOL;  Surgeon: Yetta Flock, MD;  Location: [facility];  Service: Gastroenterology;  Laterality:  N/A;   ESOPHAGOGASTRODUODENOSCOPY (EGD) WITH PROPOFOL N/A 07/10/2016   Procedure: ESOPHAGOGASTRODUODENOSCOPY (EGD) WITH PROPOFOL;  Surgeon: Manus Gunning, MD;  [facility];  Service: Gastroenterology;  Laterality: N/A;   POLYPECTOMY  09/26/2019   Procedure: POLYPECTOMY;  Surgeon: Yetta Flock, MD;  [facility];  Service: Gastroenterology;;   s/p ovary cyst     s/p right knee arthroscopy     Dr. Mardelle Matte ortho     A IV  Location/Drains/Wounds Patient Lines/Drains/Airways Status     Active Line/Drains/Airways     Name Placement date Placement time Site Days   Peripheral IV 07/21/22 20 G 2.5" Anterior;Proximal;Right Forearm 07/21/22  0155  Forearm  less than 1   External Urinary Catheter 12/18/21  2156  --  215   Pressure Injury 09/19/21 Sacrum Mid Stage 2 -  Partial thickness loss of dermis presenting as a shallow open injury with a red, pink wound bed without slough. 09/19/21  1923  -- 305            Intake/Output Last 24 hours No intake or output data in the 24 hours ending 07/21/22 0744  Labs/Imaging Results for orders placed or performed during the hospital encounter of 07/20/22 (from the past 48 hour(s))  Lactic acid, plasma     Status: None   Collection Time: 07/20/22  2:29 PM  Result Value Ref Range   Lactic Acid, Venous 1.3 0.5 - 1.9 mmol/L    Comment: Performed at [facility], [address] [place], [place] [address]  Comprehensive metabolic panel     Status: Abnormal   Collection Time: 07/20/22  2:29 PM  Result Value Ref Range   Sodium 140 135 - 145 mmol/L   Potassium 5.0 3.5 - 5.1 mmol/L   Chloride 102 98 - 111 mmol/L   CO2 29 22 - 32 mmol/L   Glucose, Bld 123 (H) 70 - 99 mg/dL    Comment: Glucose reference range applies only to samples taken after fasting for at least 8 hours.   BUN 31 (H) 8 - 23 mg/dL   Creatinine, Ser 2.60 (H) 0.44 - 1.00 mg/dL   Calcium 9.1 8.9 - 10.3 mg/dL   Total Protein 7.3 6.5 - 8.1 g/dL   Albumin 3.6 3.5 - 5.0 g/dL   AST 24 15 - 41 U/L   ALT 25 0 - 44 U/L   Alkaline Phosphatase 63 38 - 126 U/L   Total Bilirubin 1.4 (H) 0.3 - 1.2 mg/dL   GFR, Estimated 20 (L) >60 mL/min    Comment: (NOTE) Calculated using the CKD-EPI Creatinine Equation (2021)    Anion gap 9 5 - 15    Comment: Performed at [facility] [address], [place], [place] [address]  CBC with Differential     Status: Abnormal   Collection Time: 07/20/22  2:29 PM   Result Value Ref Range   WBC 9.6 4.0 - 10.5 K/uL   RBC 3.95 3.87 - 5.11 MIL/uL   Hemoglobin 11.9 (L) 12.0 - 15.0 g/dL   HCT 37.0 36.0 - 46.0 %   MCV 93.7 80.0 - 100.0 fL   MCH 30.1 26.0 - 34.0 pg   MCHC 32.2 30.0 - 36.0 g/dL   RDW 14.7 11.5 - 15.5 %   Platelets 257 150 - 400 K/uL   nRBC 0.0 0.0 - 0.2 %   Neutrophils Relative % 65 %   Neutro Abs 6.3 1.7 - 7.7 K/uL   Lymphocytes Relative 25 %  Lymphs Abs 2.4 0.7 - 4.0 K/uL   Monocytes Relative 7 %   Monocytes Absolute 0.7 0.1 - 1.0 K/uL   Eosinophils Relative 2 %   Eosinophils Absolute 0.2 0.0 - 0.5 K/uL   Basophils Relative 1 %   Basophils Absolute 0.1 0.0 - 0.1 K/uL   Immature Granulocytes 0 %   Abs Immature Granulocytes 0.04 0.00 - 0.07 K/uL    Comment: Performed at [facility], [place] [address] [place], [place] [address]  Urinalysis, Routine w reflex microscopic     Status: Abnormal   Collection Time: 07/21/22 12:15 AM  Result Value Ref Range   Color, Urine YELLOW YELLOW   APPearance HAZY (A) CLEAR   Specific Gravity, Urine 1.010 1.005 - 1.030   pH 5.0 5.0 - 8.0   Glucose, UA 50 (A) NEGATIVE mg/dL   Hgb urine dipstick SMALL (A) NEGATIVE   Bilirubin Urine NEGATIVE NEGATIVE   Ketones, ur NEGATIVE NEGATIVE mg/dL   Protein, ur NEGATIVE NEGATIVE mg/dL   Nitrite NEGATIVE NEGATIVE   Leukocytes,Ua MODERATE (A) NEGATIVE   RBC / HPF 0-5 0 - 5 RBC/hpf   WBC, UA 21-50 0 - 5 WBC/hpf   Bacteria, UA RARE (A) NONE SEEN   Squamous Epithelial / LPF 0-5 0 - 5    Comment: Performed at [facility], [place] [address], [place], [place] [address]  Magnesium     Status: None   Collection Time: 07/21/22  5:50 AM  Result Value Ref Range   Magnesium 1.9 1.7 - 2.4 mg/dL    Comment: Performed at [facility] [address], [place], [place] [address]  Basic metabolic panel     Status: Abnormal   Collection Time: 07/21/22  5:50 AM  Result Value Ref Range   Sodium 138 135 - 145 mmol/L   Potassium 3.7 3.5 - 5.1 mmol/L     Comment: DELTA CHECK NOTED   Chloride 100 98 - 111 mmol/L   CO2 28 22 - 32 mmol/L   Glucose, Bld 124 (H) 70 - 99 mg/dL    Comment: Glucose reference range applies only to samples taken after fasting for at least 8 hours.   BUN 26 (H) 8 - 23 mg/dL   Creatinine, Ser 1.92 (H) 0.44 - 1.00 mg/dL   Calcium 8.6 (L) 8.9 - 10.3 mg/dL   GFR, Estimated 29 (L) >60 mL/min    Comment: (NOTE) Calculated using the CKD-EPI Creatinine Equation (2021)    Anion gap 10 5 - 15    Comment: Performed at [facility] [address], [place], [place] [address]  CBC with Differential/Platelet     Status: Abnormal   Collection Time: 07/21/22  5:50 AM  Result Value Ref Range   WBC 9.2 4.0 - 10.5 K/uL   RBC 3.68 (L) 3.87 - 5.11 MIL/uL   Hemoglobin 11.1 (L) 12.0 - 15.0 g/dL   HCT 34.0 (L) 36.0 - 46.0 %   MCV 92.4 80.0 - 100.0 fL   MCH 30.2 26.0 - 34.0 pg   MCHC 32.6 30.0 - 36.0 g/dL   RDW 14.6 11.5 - 15.5 %   Platelets 240 150 - 400 K/uL   nRBC 0.0 0.0 - 0.2 %   Neutrophils Relative % 65 %   Neutro Abs 6.0 1.7 - 7.7 K/uL   Lymphocytes Relative 25 %   Lymphs Abs 2.3 0.7 - 4.0 K/uL   Monocytes Relative 7 %   Monocytes Absolute 0.7 0.1 -  1.0 K/uL   Eosinophils Relative 2 %   Eosinophils Absolute 0.2 0.0 - 0.5 K/uL   Basophils Relative 1 %   Basophils Absolute 0.1 0.0 - 0.1 K/uL   Immature Granulocytes 0 %   Abs Immature Granulocytes 0.02 0.00 - 0.07 K/uL    Comment: Performed at [facility], [place] [address], [place], [place] [address]   No results found.  Pending Labs Unresulted Labs (From admission, onward)     Start     Ordered   07/21/22 0301  HIV Antibody (routine testing w rflx)  (HIV Antibody (Routine testing w reflex) panel)  Once,   R        07/21/22 0302   07/21/22 0301  CBC  (heparin)  Once,   R       Comments: Baseline for heparin therapy IF NOT ALREADY DRAWN.  Notify MD if PLT < 100 K.    07/21/22 0302   07/20/22 2245  Urine Culture  Once,   URGENT       Question:   Indication  Answer:  Dysuria   07/20/22 2244            Vitals/Pain Today's Vitals   07/21/22 0500 07/21/22 0515 07/21/22 0530 07/21/22 0742  BP: (!) 166/128 (!) 132/44 (!) 127/47 (!) 126/54  Pulse: 72 76 73 72  Resp:   16 18  Temp:    98.2 F (36.8 C)  TempSrc:    Oral  SpO2: 96% 99% 94% 99%  PainSc:    7     Isolation Precautions No active isolations  Medications Medications  metoprolol tartrate (LOPRESSOR) tablet 25 mg (has no administration in time range)  pantoprazole (PROTONIX) EC tablet 40 mg (has no administration in time range)  furosemide (LASIX) injection 40 mg (40 mg Intravenous Given 07/21/22 0335)  HYDROmorphone (DILAUDID) injection 1 mg (1 mg Intravenous Given 07/21/22 0340)  insulin aspart (novoLOG) injection 0-15 Units (has no administration in time range)  insulin aspart (novoLOG) injection 0-5 Units (has no administration in time range)  heparin injection 5,000 Units (5,000 Units Subcutaneous Not Given 07/21/22 0554)  acetaminophen (TYLENOL) tablet 650 mg (has no administration in time range)    Or  acetaminophen (TYLENOL) suppository 650 mg (has no administration in time range)  senna-docusate (Senokot-S) tablet 1 tablet (has no administration in time range)  ondansetron (ZOFRAN) tablet 4 mg (has no administration in time range)    Or  ondansetron (ZOFRAN) injection 4 mg (has no administration in time range)  metoprolol tartrate (LOPRESSOR) injection 5 mg (has no administration in time range)  vancomycin variable dose per unstable renal function (pharmacist dosing) (has no administration in time range)  pramipexole (MIRAPEX) tablet 0.5 mg (0.5 mg Oral Given 07/21/22 0513)  cefTRIAXone (ROCEPHIN) 2 g in sodium chloride 0.9 % 100 mL IVPB (0 g Intravenous Stopped 07/21/22 0300)  fentaNYL (SUBLIMAZE) injection 50 mcg (50 mcg Intravenous Given 07/21/22 0200)  vancomycin (VANCOCIN) 2,500 mg in sodium chloride 0.9 % 500 mL IVPB (0 mg Intravenous Stopped 07/21/22 0546)     Mobility walks with device Pt usually walks with a walker at home. Pt has not been walking since admission, complains of generalized weakness and pain in the legs. Low fall risk   Focused Assessments   R Recommendations: See Admitting Provider Note  Report given to:   Additional Notes:  Pt has a purewick.

## 2022-07-21 NOTE — ED Notes (Signed)
Admit MD at bedside

## 2022-07-21 NOTE — H&P (Signed)
History and Physical    Patient: Maria [name] QQI:[id number] DOB: [birth year] DOA: 07/20/2022 DOS: the patient was seen and examined on 07/21/2022 PCP: Carson Myrtle, NP  Patient coming from: Home  Chief Complaint:  Chief Complaint  Patient presents with   Weakness   HPI: Maria [name] is a 66 y.o. female with medical history significant of chronic lymphadenitis, extreme morbid obesity, congestive heart failure, anxiety and depression, diabetes, dyslipidemia.  Per patient for the last couple of months she has had lower extremity edema swelling.  Her legs have been weeping like crazy and everything else getting wet.  She states she is a transfer wrap her lower extremity without success.  Now her leg feels as though there is a fire and she went to urinate today and her urine burns really badly.  She saw cardiologist on the 28th of last month, he increased her torsemide to 3 tablets daily in the a.m. when she is busy and p.m.  This was done for 3 days.  Patient states she has been compliant with her medication and this changes.  She states she had her urine output has decreased over the past 3 days. Per patient her feet started over swollen and painful to walk on.  She fell a week back secondary to the pain.  She is unable to wear any of her shoes.  She fell Friday night because of the swelling and pain in her extremities.  She states she was unable to stand and was on the floor for 2 hours.  She eventually had to call 911 to assist her up.   Review of Systems: As mentioned in the history of present illness. All other systems reviewed and are negative. Past Medical History:  Diagnosis Date   Acute lymphadenitis 2011   ALLERGIC RHINITIS 08/10/2007   ANXIETY 12/06/2007   Atherosclerotic peripheral vascular disease ([place]) 06/13/2013   Aorta on CT June 2014   Cellulitis 2011   Cervical disc disease 03/09/2012   CHF (congestive heart failure) ([place])    Chronic pain 03/09/2012   DEPRESSION 12/06/2007    Diabetes ([place])    DIVERTICULOSIS, [place] 12/06/2007   GERD 12/06/2007   Hepatitis    age 66 hepatitis A   HLD (hyperlipidemia) 05/24/2019   HYPERTENSION 12/06/2007   Lumbar disc disease 03/09/2012   Mesenteric adenitis    MRSA 2006   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 2010   [place]  may 24th 2010   COLONOSCOPY WITH PROPOFOL N/A 07/10/2016   Procedure: COLONOSCOPY WITH PROPOFOL;  Surgeon: Manus Gunning, MD;  [facility];  Service: Gastroenterology;  Laterality: N/A;   COLONOSCOPY WITH PROPOFOL N/A 09/26/2019   Procedure: COLONOSCOPY WITH PROPOFOL;  Surgeon: Yetta Flock, MD;  [facility];  Service: Gastroenterology;  Laterality: N/A;   ESOPHAGOGASTRODUODENOSCOPY (EGD) WITH PROPOFOL N/A 07/10/2016   Procedure: ESOPHAGOGASTRODUODENOSCOPY (EGD) WITH PROPOFOL;  Surgeon: Manus Gunning, MD;  [facility];  Service: Gastroenterology;  Laterality: N/A;   POLYPECTOMY  09/26/2019   Procedure: POLYPECTOMY;  Surgeon: Yetta Flock, MD;  [facility];  Service: Gastroenterology;;   s/p ovary cyst     s/p right knee arthroscopy     Dr. Mardelle Matte ortho   Social History:  reports  that she quit smoking about 13 years ago. Her smoking use included cigarettes. She has never used smokeless tobacco. She reports that she does not drink alcohol and does not use drugs.  Allergies  Allergen Reactions   Amoxicillin-Pot Clavulanate Nausea And Vomiting    Projectile vomiting Did it involve swelling of the face/tongue/throat, SOB, or low BP? No Did it involve sudden or severe rash/hives, skin peeling, or any reaction on the inside of your mouth or nose? No Did you need to seek medical attention at a hospital or  doctor's office? No When did it last happen?      20-30 years If all above answers are "NO", may proceed with cephalosporin use.    Adhesive [Tape] Other (See Comments)    Tears skin off - use paper tape    Codeine Other (See Comments)    hallucinations   Crestor [Rosuvastatin Calcium]     Severe muscle cramps   Morphine And Related Nausea And Vomiting and Other (See Comments)    Migraine headaches   Vicodin [Hydrocodone-Acetaminophen] Other (See Comments)    hallucinations   Latex Rash    Family History  Problem Relation Age of Onset   Heart disease Mother    Hypertension Mother    Diabetes Mother    Heart failure Mother    Asthma Sister    Anxiety disorder Sister    Depression Sister    Hypertension Father    Asthma Daughter    Bipolar disorder Daughter    Cancer Maternal Uncle        [place]   Cancer Other        ovarian   Liver cancer Paternal Grandmother        ????    Prior to Admission medications   Medication Sig Start Date End Date Taking? Authorizing Provider  albuterol (PROAIR HFA) 108 (90 Base) MCG/ACT inhaler Inhale 2 puffs into the lungs every 6 (six) hours as needed for wheezing or shortness of breath. 11/05/20  Yes Colin Benton R, DO  cholecalciferol (VITAMIN D3) 25 MCG (1000 UNIT) tablet Take 2,000 Units by mouth daily.   Yes [provider]  clonazePAM (KLONOPIN) 1 MG tablet TAKE ONE TABLET BY MOUTH TWICE DAILY AS NEEDED FOR ANXIETY Patient taking differently: Take 1 mg by mouth 2 (two) times daily as needed for anxiety. 06/04/22  Yes Biagio Borg, MD  cyclobenzaprine (FLEXERIL) 10 MG tablet TAKE 1 TABLET BY MOUTH 2 TIMES A DAY AS NEEDED FOR MUSCLE SPASMS Patient taking differently: Take 10 mg by mouth daily as needed. TAKE 1 TABLET BY MOUTH 2 TIMES A DAY AS NEEDED FOR MUSCLE SPASMS 02/27/21  Yes Biagio Borg, MD  FARXIGA 5 MG TABS tablet Take 5 mg by mouth daily. 07/06/22  Yes [provider]  ibuprofen (ADVIL) 800 MG tablet Take 800 mg  by mouth every 8 (eight) hours as needed for headache. 05/07/22  Yes [provider]  Metamucil Fiber CHEW Chew 3 tablets by mouth at bedtime.   Yes [provider]  metolazone (ZAROXOLYN) 5 MG tablet Take 5 mg by mouth 2 (two) times a week. Per nephrologist on Tuesday and Friday   Yes Claudia Desanctis, MD  metoprolol tartrate (LOPRESSOR) 25 MG tablet Take 25 mg by mouth 2 (two) times daily.   Yes [provider]  ondansetron (ZOFRAN ODT) 4 MG disintegrating tablet Take 1 tablet (4 mg total) by mouth every 4 (four) hours as needed for nausea or vomiting.  08/23/19  Yes Charlesetta Shanks, MD  Oxycodone HCl 10 MG TABS Take 1 tablet (10 mg total) 4 (four) times daily by mouth. Per [facility] Patient taking differently: Take 10 mg by mouth every 4 (four) hours as needed (pain). Per [facility] 11/08/17  Yes Biagio Borg, MD  pantoprazole (PROTONIX) 40 MG tablet TAKE ONE TABLET BY MOUTH DAILY Patient taking differently: Take 40 mg by mouth daily. 01/29/22  Yes Biagio Borg, MD  Polyethyl Glycol-Propyl Glycol 0.4-0.3 % SOLN Apply 1 drop to eye daily as needed (dry eyes).   Yes [provider]  potassium chloride SA (KLOR-CON M) 20 MEQ tablet Take 80 mEq by mouth See admin instructions. Take 4 tablets ( 11m) by mouth twice a day as needed for potassium levels are low per Nephrologist 06/30/22  Yes Cleaver, JJossie Ng NP  pramipexole (MIRAPEX) 0.5 MG tablet Take 1 tablet (0.5 mg total) by mouth 3 (three) times daily. 06/24/22  Yes JBiagio Borg MD  torsemide (DEMADEX) 20 MG tablet Take 2 tablets in the morning and 1 tablet in the afternoon. Patient taking differently: Take 20-60 mg by mouth See admin instructions. Take 3 tablets by mouth in the morning then take 1 tablet in the afternoon - per nephrologist 12/30/21  Yes HMinus Breeding MD  Blood Glucose Monitoring Suppl (ACCU-CHEK GUIDE) w/Device KIT Use as directed Patient taking differently: 1 each by Other route  as directed. 04/11/21   PElodia Florence, MD  glucose blood (ACCU-CHEK GUIDE) test strip Use as instructed up to 4 times daily 04/11/21   PElodia Florence, MD  [facility] MG/0.1ML LIQD nasal spray kit Place 0.4 mg into the nose daily as needed (opioid overdose). Patient not taking: Reported on 07/21/2022 04/25/19   [provider]  DULoxetine (CYMBALTA) 60 MG capsule Take 60 mg by mouth daily.    03/09/12  [provider]    Physical Exam: Vitals:   07/20/22 2230 07/20/22 2320 07/21/22 0015 07/21/22 0130  BP: (!) 118/47 (!) 137/37 (!) 132/47 (!) 118/47  Pulse: 60 (!) 108 65 73  Resp: '18 19 20   ' Temp:      TempSrc:      SpO2: 99% (!) 79% 100% 100%   General: Morbidly obese female, somewhat distressed HEENT: Dry oral mucosa Lungs: Clear to auscultation bilaterally Cardiovascular: Regular rate and rhythm without murmurs regurg or gallop Abdomen: Obese, soft, nontender nondistended Extremities: Bilateral lower extremity edema, erythematous at base with denuding skin and some blisters on feet.  No open blisters noted  Musculoskeletal: Strength 5/5, moves extremities equally Psych: Alert oriented x3 Neuro: Cranial nerves III through XII grossly Skin: Erythematous areas below bilateral pannus with few areas of denuded skin on the right pannus.  Data Reviewed:  There are no new results to review at this time.  Assessment and Plan: Bilateral lower extremities cellulitis -Admit to MedSurg -Likely secondary to fluid overload -IV vancomycin and Rocephin ordered  Worsened lymphedema/fluid overload -IV Lasix ordered, strict I's and O's, daily weights  Acute on chronic kidney injury -Worsening renal function secondary to diuresis and intravascular depletion -Nephrology consult placed -Strict I's and O's  Burning urination -Question UTI, doubtful suspect yeast -Antifungal/total treatment/lotrimin cream ordered -Continue PureWick.  No skin breakdown  Diabetes  mellitus Sliding scale insulin initiated    Advance Care Planning:   Code Status: Prior   Consults: nephrology  Family Communication:   Severity of Illness: The appropriate patient status for  this patient is INPATIENT. Inpatient status is judged to be reasonable and necessary in order to provide the required intensity of service to ensure the patient's safety. The patient's presenting symptoms, physical exam findings, and initial radiographic and laboratory data in the context of their chronic comorbidities is felt to place them at high risk for further clinical deterioration. Furthermore, it is not anticipated that the patient will be medically stable for discharge from the hospital within 2 midnights of admission.   * I certify that at the point of admission it is my clinical judgment that the patient will require inpatient hospital care spanning beyond 2 midnights from the point of admission due to high intensity of service, high risk for further deterioration and high frequency of surveillance required.*  Author: Quintella Baton, MD 07/21/2022 2:41 AM  For on call review [email or web address].

## 2022-07-22 ENCOUNTER — Other Ambulatory Visit: Payer: Self-pay | Admitting: Physician Assistant

## 2022-07-22 DIAGNOSIS — N390 Urinary tract infection, site not specified: Secondary | ICD-10-CM

## 2022-07-22 DIAGNOSIS — F411 Generalized anxiety disorder: Secondary | ICD-10-CM

## 2022-07-22 DIAGNOSIS — G8929 Other chronic pain: Secondary | ICD-10-CM

## 2022-07-22 DIAGNOSIS — N1831 Chronic kidney disease, stage 3a: Secondary | ICD-10-CM

## 2022-07-22 DIAGNOSIS — K219 Gastro-esophageal reflux disease without esophagitis: Secondary | ICD-10-CM

## 2022-07-22 DIAGNOSIS — I872 Venous insufficiency (chronic) (peripheral): Secondary | ICD-10-CM

## 2022-07-22 DIAGNOSIS — F1120 Opioid dependence, uncomplicated: Secondary | ICD-10-CM

## 2022-07-22 DIAGNOSIS — I5033 Acute on chronic diastolic (congestive) heart failure: Secondary | ICD-10-CM | POA: Diagnosis not present

## 2022-07-22 DIAGNOSIS — I5031 Acute diastolic (congestive) heart failure: Secondary | ICD-10-CM

## 2022-07-22 DIAGNOSIS — R5381 Other malaise: Secondary | ICD-10-CM

## 2022-07-22 DIAGNOSIS — L03119 Cellulitis of unspecified part of limb: Secondary | ICD-10-CM | POA: Diagnosis not present

## 2022-07-22 DIAGNOSIS — Z789 Other specified health status: Secondary | ICD-10-CM

## 2022-07-22 DIAGNOSIS — N179 Acute kidney failure, unspecified: Secondary | ICD-10-CM | POA: Diagnosis not present

## 2022-07-22 DIAGNOSIS — R531 Weakness: Secondary | ICD-10-CM

## 2022-07-22 DIAGNOSIS — N3000 Acute cystitis without hematuria: Secondary | ICD-10-CM

## 2022-07-22 DIAGNOSIS — I890 Lymphedema, not elsewhere classified: Secondary | ICD-10-CM

## 2022-07-22 DIAGNOSIS — E1169 Type 2 diabetes mellitus with other specified complication: Secondary | ICD-10-CM

## 2022-07-22 DIAGNOSIS — E669 Obesity, unspecified: Secondary | ICD-10-CM

## 2022-07-22 DIAGNOSIS — E876 Hypokalemia: Secondary | ICD-10-CM

## 2022-07-22 DIAGNOSIS — G894 Chronic pain syndrome: Secondary | ICD-10-CM

## 2022-07-22 DIAGNOSIS — M545 Low back pain, unspecified: Secondary | ICD-10-CM

## 2022-07-22 LAB — BASIC METABOLIC PANEL
Anion gap: 10 (ref 5–15)
BUN: 21 mg/dL (ref 8–23)
CO2: 28 mmol/L (ref 22–32)
Calcium: 8.1 mg/dL — ABNORMAL LOW (ref 8.9–10.3)
Chloride: 100 mmol/L (ref 98–111)
Creatinine, Ser: 1.58 mg/dL — ABNORMAL HIGH (ref 0.44–1.00)
GFR, Estimated: 36 mL/min — ABNORMAL LOW (ref >60)
Glucose, Bld: 109 mg/dL — ABNORMAL HIGH (ref 70–99)
Potassium: 3 mmol/L — ABNORMAL LOW (ref 3.5–5.1)
Sodium: 138 mmol/L (ref 135–145)

## 2022-07-22 LAB — GLUCOSE, CAPILLARY
Glucose-Capillary: 111 mg/dL — ABNORMAL HIGH (ref 70–99)
Glucose-Capillary: 134 mg/dL — ABNORMAL HIGH (ref 70–99)
Glucose-Capillary: 126 mg/dL — ABNORMAL HIGH (ref 70–99)
Glucose-Capillary: 115 mg/dL — ABNORMAL HIGH (ref 70–99)

## 2022-07-22 LAB — HIV-1 RNA QUANT-NO REFLEX-BLD
HIV 1 RNA Quant: <20 copies/mL
LOG10 HIV-1 RNA: UNDETERMINED log10copy/mL

## 2022-07-22 MED ORDER — POTASSIUM CHLORIDE CRYS ER 20 MEQ PO TBCR
40.0000 meq | EXTENDED_RELEASE_TABLET | Freq: Two times a day (BID) | ORAL | Status: AC
  Administered 2022-07-22 (×2): 40 meq via ORAL
  Filled 2022-07-22 (×2): qty 2

## 2022-07-22 MED ORDER — OXYCODONE HCL 5 MG PO TABS
10.0000 mg | ORAL_TABLET | ORAL | Status: DC | PRN
  Administered 2022-07-22 – 2022-07-25 (×4): 10 mg via ORAL
  Filled 2022-07-22 (×4): qty 2

## 2022-07-22 MED ORDER — CLONAZEPAM 0.5 MG PO TABS
1.0000 mg | ORAL_TABLET | Freq: Two times a day (BID) | ORAL | Status: DC | PRN
  Administered 2022-07-22 – 2022-07-29 (×9): 1 mg via ORAL
  Filled 2022-07-22 (×9): qty 2

## 2022-07-22 MED ORDER — DOXYCYCLINE HYCLATE 100 MG PO TABS
100.0000 mg | ORAL_TABLET | Freq: Two times a day (BID) | ORAL | Status: DC
  Administered 2022-07-23 – 2022-07-24 (×3): 100 mg via ORAL
  Filled 2022-07-22 (×3): qty 1

## 2022-07-22 MED ORDER — VANCOMYCIN HCL IN DEXTROSE 1-5 GM/200ML-% IV SOLN
1000.0000 mg | INTRAVENOUS | Status: DC
  Administered 2022-07-22: 1000 mg via INTRAVENOUS
  Filled 2022-07-22: qty 200

## 2022-07-22 NOTE — Progress Notes (Signed)
Pharmacy Antibiotic Note  Maria [name] is a 66 y.o. female admitted on 07/20/2022 with weakness, concern of UTI + BLE cellulitis.  Pharmacy has been consulted for vancomycin dosing - today is day 2.   Renal function improved and SCr down to 1.58 (baseline ~1.1). She is afebrile and WBC are normal. Urine showing >100K GNR - on ceftriaxone.  Plan: Vancomycin 1000 mg IV q24h Goal AUC 400-550. Expected AUC: 439.1 SCr used: 1.58 Obtain vancomycin levels as appropriate  Monitor renal function, cultures, and clinical progression   Weight: (!) 158.3 kg (348 lb 15.8 oz)  Temp (24hrs), Avg:98.5 F (36.9 C), Min:98.2 F (36.8 C), Max:98.9 F (37.2 C)  Recent Labs  Lab 07/17/22 1536 07/20/22 1429 07/21/22 0550 07/22/22 0638  WBC  --  9.6 9.2  --   CREATININE 2.07* 2.60* 1.92* 1.58*  LATICACIDVEN  --  1.3  --   --      Estimated Creatinine Clearance: 53.1 mL/min (A) (by C-G formula based on SCr of 1.58 mg/dL (H)).    Allergies  Allergen Reactions   Amoxicillin-Pot Clavulanate Nausea And Vomiting    Projectile vomiting Did it involve swelling of the face/tongue/throat, SOB, or low BP? No Did it involve sudden or severe rash/hives, skin peeling, or any reaction on the inside of your mouth or nose? No Did you need to seek medical attention at a hospital or doctor's office? No When did it last happen?      20-30 years If all above answers are "NO", may proceed with cephalosporin use.    Adhesive [Tape] Other (See Comments)    Tears skin off - use paper tape    Codeine Other (See Comments)    hallucinations   Crestor [Rosuvastatin Calcium]     Severe muscle cramps   Morphine And Related Nausea And Vomiting and Other (See Comments)    Migraine headaches   Vicodin [Hydrocodone-Acetaminophen] Other (See Comments)    hallucinations   Latex Rash    Antimicrobials this admission: Vancomycin 8/1 >>  Ceftriaxone 8/1 >>  Dose adjustments this admission:  Microbiology  results: 8/1 UCx: >100K GNR  Thank you for involving pharmacy in this patient's care.  Renold Genta, PharmD, BCPS Clinical Pharmacist Clinical phone for 07/22/2022 until 3p is [phone] 07/22/2022 9:26 AM

## 2022-07-22 NOTE — Progress Notes (Signed)
Progress Note  Patient Name: Maria [name] Date of Encounter: 07/22/2022  Primary Cardiologist:   Minus Breeding, MD   Subjective   No acute pain.  No SOB.   Inpatient Medications    Scheduled Meds:  clotrimazole   Topical BID   furosemide  40 mg Intravenous Q12H   heparin  5,000 Units Subcutaneous Q8H   insulin aspart  0-15 Units Subcutaneous TID WC   insulin aspart  0-5 Units Subcutaneous QHS   metoprolol tartrate  25 mg Oral BID   pantoprazole  40 mg Oral Daily   potassium chloride  40 mEq Oral BID   pramipexole  0.5 mg Oral TID   vancomycin variable dose per unstable renal function (pharmacist dosing)   Does not apply See admin instructions   Continuous Infusions:  cefTRIAXone (ROCEPHIN)  IV 2 g (07/21/22 2245)   PRN Meds: acetaminophen **OR** acetaminophen, HYDROmorphone (DILAUDID) injection, metoprolol tartrate, ondansetron **OR** ondansetron (ZOFRAN) IV, senna-docusate   Vital Signs    Vitals:   07/21/22 1049 07/21/22 1928 07/22/22 0500 07/22/22 0728  BP: (!) 131/49 (!) 105/38  (!) 114/42  Pulse: 75 72  (!) 59  Resp: 20     Temp: 98.9 F (37.2 C) 98.3 F (36.8 C)  98.2 F (36.8 C)  TempSrc: Oral Oral  Oral  SpO2: 98% 100%  95%  Weight:   (!) 158.3 kg     Intake/Output Summary (Last 24 hours) at 07/22/2022 0919 Last data filed at 07/22/2022 7846 Gross per 24 hour  Intake 460 ml  Output 850 ml  Net -390 ml   Filed Weights   07/22/22 0500  Weight: (!) 158.3 kg    Telemetry    NA - Personally Reviewed  ECG    NA - Personally Reviewed  Physical Exam   GEN: No acute distress.   Neck: No  JVD Cardiac: RRR, soft brief apical systolic murmurs, rubs, or gallops.  Respiratory: Clear  to auscultation bilaterally. GI: Soft, nontender, non-distended  MS:    Moderate lower extremity edema; No deformity. Neuro:  Nonfocal  Psych: Normal affect   Labs    Chemistry Recent Labs  Lab 07/20/22 1429 07/21/22 0550 07/22/22 0638  NA 140 138 138  K  5.0 3.7 3.0*  CL 102 100 100  CO2 '29 28 28  '$ GLUCOSE 123* 124* 109*  BUN 31* 26* 21  CREATININE 2.60* 1.92* 1.58*  CALCIUM 9.1 8.6* 8.1*  PROT 7.3  --   --   ALBUMIN 3.6  --   --   AST 24  --   --   ALT 25  --   --   ALKPHOS 63  --   --   BILITOT 1.4*  --   --   GFRNONAA 20* 29* 36*  ANIONGAP '9 10 10     '$ Hematology Recent Labs  Lab 07/20/22 1429 07/21/22 0550  WBC 9.6 9.2  RBC 3.95 3.68*  HGB 11.9* 11.1*  HCT 37.0 34.0*  MCV 93.7 92.4  MCH 30.1 30.2  MCHC 32.2 32.6  RDW 14.7 14.6  PLT 257 240    Cardiac EnzymesNo results for input(s): "TROPONINI" in the last 168 hours. No results for input(s): "TROPIPOC" in the last 168 hours.   BNPNo results for input(s): "BNP", "PROBNP" in the last 168 hours.   DDimer No results for input(s): "DDIMER" in the last 168 hours.   Radiology    No results found.  Cardiac Studies   NA  Patient Profile  66 y.o. female with chronic diastolic dysfunction and CKD who presents with leg swelling, pain, cellulitis.    Assessment & Plan    Acute on chronic diastolic dysfunction:     I/O incompleted.  Wt recorded looks like the lowest I have seen in months.  (348).    However, given the fact that we have her in house and she is tolerating diuresis I would continue with this IV.   I will order strict intake and output and daily weights.      HTN:  BP at target.   Hypokalemia:  Replaced.  Follow.    CKD IIIB:  Creat is improved.   Continue to follow.    For questions or updates, please contact [place] Please consult [email or web address] for contact info under Cardiology/STEMI.   Signed, Minus Breeding, MD  07/22/2022, 9:19 AM

## 2022-07-22 NOTE — Evaluation (Signed)
Occupational Therapy Evaluation Patient Details Name: Maria [name] MRN: [id number] DOB: 08-06-1956 Today's Date: 07/22/2022   History of Present Illness 66 yo female presents to ED on 7/31 with weakness, weeping LEs, fall on 7/28. Workup for cellulitis and UTI. Admission 11/2021 for HF. PMH - lymphedema, chf, depression, anxiety, ckd, DM, HTN, fatty liver, obesity.   Clinical Impression   Pt admitted for concerns listed above. PTA pt reported that she was fairly independent with all ADL's and functional mobility, however has had multiple falls due to global weakness. At this time, she presents with increased weakness, balance deficits, decreased activity tolerance. She is requiring up to min A for functional mobility and min guard to mod A for ADL's. Due to history of falls and increased weakness, recommending AIR to maximize her independence and safety prior to returning home. OT will follow acutely.      Recommendations for follow up therapy are one component of a multi-disciplinary discharge planning process, led by the attending physician.  Recommendations may be updated based on patient status, additional functional criteria and insurance authorization.   Follow Up Recommendations  Acute inpatient rehab (3hours/day)    Assistance Recommended at Discharge Frequent or constant Supervision/Assistance  Patient can return home with the following A little help with walking and/or transfers;A lot of help with bathing/dressing/bathroom;Assistance with cooking/housework;Help with stairs or ramp for entrance    Functional Status Assessment  Patient has had a recent decline in their functional status and demonstrates the ability to make significant improvements in function in a reasonable and predictable amount of time.  Equipment Recommendations  None recommended by OT    Recommendations for Other Services       Precautions / Restrictions Precautions Precautions:  Fall Restrictions Weight Bearing Restrictions: No      Mobility Bed Mobility Overal bed mobility: Needs Assistance Bed Mobility: Supine to Sit     Supine to sit: Min guard     General bed mobility comments: Min guard with heavy use of bed rails and HOB elevated    Transfers Overall transfer level: Needs assistance Equipment used: Rolling walker (2 wheels) Transfers: Sit to/from Stand, Bed to chair/wheelchair/BSC Sit to Stand: Min guard, Min assist     Step pivot transfers: Min guard, Min assist     General transfer comment: Requiring verbal cues for hand placement and assist to hold front of RW due to it tipping over even with 1UE on RW.      Balance Overall balance assessment: Needs assistance Sitting-balance support: Bilateral upper extremity supported, Feet supported Sitting balance-Leahy Scale: Fair     Standing balance support: Bilateral upper extremity supported, Reliant on assistive device for balance Standing balance-Leahy Scale: Poor                             ADL either performed or assessed with clinical judgement   ADL Overall ADL's : Needs assistance/impaired Eating/Feeding: Set up;Sitting   Grooming: Min guard;Standing   Upper Body Bathing: Min guard;Sitting   Lower Body Bathing: Moderate assistance;Sit to/from stand;Sitting/lateral leans   Upper Body Dressing : Set up;Sitting   Lower Body Dressing: Moderate assistance;Sit to/from stand;Sitting/lateral leans   Toilet Transfer: Minimal assistance;Stand-pivot   Toileting- Clothing Manipulation and Hygiene: Moderate assistance;Sitting/lateral lean;Sit to/from stand       Functional mobility during ADLs: Minimal assistance;Rolling walker (2 wheels) General ADL Comments: Limited by BLE weakness, requiring increased assist in standing for ADL's due to  decreased balance     Vision Baseline Vision/History: 1 Wears glasses Ability to See in Adequate Light: 0 Adequate Patient Visual  Report: No change from baseline Vision Assessment?: No apparent visual deficits     Perception     Praxis      Pertinent Vitals/Pain Pain Assessment Pain Assessment: Faces Faces Pain Scale: Hurts even more Pain Location: R hip, b/l knees Pain Descriptors / Indicators: Aching, Discomfort, Grimacing Pain Intervention(s): Limited activity within patient's tolerance, Monitored during session, Repositioned     Hand Dominance Right   Extremity/Trunk Assessment Upper Extremity Assessment Upper Extremity Assessment: Generalized weakness   Lower Extremity Assessment Lower Extremity Assessment: Generalized weakness   Cervical / Trunk Assessment Cervical / Trunk Assessment: Kyphotic   Communication Communication Communication: No difficulties   Cognition Arousal/Alertness: Awake/alert Behavior During Therapy: WFL for tasks assessed/performed Overall Cognitive Status: Within Functional Limits for tasks assessed                                       General Comments  VSS on RA    Exercises     Shoulder Instructions      Home Living Family/patient expects to be discharged to:: Private residence Living Arrangements: Alone Available Help at Discharge: Family;Friend(s);Available PRN/intermittently Type of Home: Apartment Home Access: Stairs to enter Entrance Stairs-Number of Steps: 6 steps that are rather uneven, where some are normal steps and some more like a platform. from parking lot. Entrance Stairs-Rails: Right Home Layout: One level     Bathroom Shower/Tub: Tub/shower unit;Curtain   Bathroom Toilet: Standard Bathroom Accessibility: No   Home Equipment: Rollator (4 wheels);Cane - single point;BSC/3in1;Shower seat;Hand held shower head;Grab bars - tub/shower;Adaptive equipment Adaptive Equipment: Reacher;Sock aid;Long-handled shoe horn;Long-handled sponge        Prior Functioning/Environment Prior Level of Function : Independent/Modified  Independent             Mobility Comments: Uses rollator in community, standard walker in home ADLs Comments: Indep with adaptive equipment        OT Problem List: Decreased strength;Decreased activity tolerance;Impaired balance (sitting and/or standing);Decreased knowledge of use of DME or AE;Obesity;Impaired UE functional use;Pain      OT Treatment/Interventions: Self-care/ADL training;Therapeutic exercise;DME and/or AE instruction;Energy conservation;Therapeutic activities;Patient/family education;Balance training    OT Goals(Current goals can be found in the care plan section) Acute Rehab OT Goals Patient Stated Goal: To go home OT Goal Formulation: With patient Time For Goal Achievement: 08/05/22 Potential to Achieve Goals: Good ADL Goals Pt Will Perform Grooming: with modified independence;standing Pt Will Perform Lower Body Bathing: with modified independence;with adaptive equipment;sitting/lateral leans;sit to/from stand Pt Will Perform Lower Body Dressing: with modified independence;sitting/lateral leans;sit to/from stand;with adaptive equipment Pt Will Transfer to Toilet: with modified independence;ambulating Pt Will Perform Toileting - Clothing Manipulation and hygiene: with modified independence;sitting/lateral leans;sit to/from stand  OT Frequency: Min 2X/week    Co-evaluation              AM-PAC OT "6 Clicks" Daily Activity     Outcome Measure Help from another person eating meals?: A Little Help from another person taking care of personal grooming?: A Little Help from another person toileting, which includes using toliet, bedpan, or urinal?: A Lot Help from another person bathing (including washing, rinsing, drying)?: A Lot Help from another person to put on and taking off regular upper body clothing?: A Little Help from another person  to put on and taking off regular lower body clothing?: A Lot 6 Click Score: 15   End of Session Equipment Utilized  During Treatment: Gait belt;Rolling walker (2 wheels) Nurse Communication: Mobility status  Activity Tolerance: Patient tolerated treatment well Patient left: in chair;with call bell/phone within reach  OT Visit Diagnosis: Unsteadiness on feet (R26.81);Other abnormalities of gait and mobility (R26.89);Muscle weakness (generalized) (M62.81)                Time: 1914-7829 OT Time Calculation (min): 64 min Charges:  OT General Charges $OT Visit: 1 Visit OT Evaluation $OT Eval Moderate Complexity: 1 Mod OT Treatments $Self Care/Home Management : 23-37 mins $Therapeutic Activity: 8-22 mins  [place] Andy H., OTR/L Acute Rehabilitation  Goldman Birchall Elane [name] Bonine 07/22/2022, 12:53 PM

## 2022-07-22 NOTE — Evaluation (Signed)
Physical Therapy Evaluation Patient Details Name: Maria [name] MRN: [id number] DOB: 02-11-1956 Today's Date: 07/22/2022  History of Present Illness  66 yo female presents to ED on 7/31 with weakness, weeping LEs, fall on 7/28. Workup for cellulitis and UTI. Admission 11/2021 for HF. PMH - lymphedema, chf, depression, anxiety, ckd, DM, HTN, fatty liver, obesity.  Clinical Impression   Pt presents with generalized weakness, decreased LE skin integrity, impaired balance with history of falls, impaired activity tolerance vs baseline. Pt to benefit from acute PT to address deficits. Pt ambulated short room distance, overall requiring close guard to min physical assist for balance. Pt eager to return to PLOF and maximize functional recovery, recommending AIR. PT to progress mobility as tolerated, and will continue to follow acutely.         Recommendations for follow up therapy are one component of a multi-disciplinary discharge planning process, led by the attending physician.  Recommendations may be updated based on patient status, additional functional criteria and insurance authorization.  Follow Up Recommendations Acute inpatient rehab (3hours/day)      Assistance Recommended at Discharge Set up Supervision/Assistance  Patient can return home with the following  A little help with walking and/or transfers;A lot of help with bathing/dressing/bathroom    Equipment Recommendations None recommended by PT  Recommendations for Other Services       Functional Status Assessment Patient has had a recent decline in their functional status and demonstrates the ability to make significant improvements in function in a reasonable and predictable amount of time.     Precautions / Restrictions Precautions Precautions: Fall Restrictions Weight Bearing Restrictions: No      Mobility  Bed Mobility               General bed mobility comments: up in chair    Transfers Overall transfer  level: Needs assistance Equipment used: Rolling walker (2 wheels) Transfers: Sit to/from Stand Sit to Stand: Min assist           General transfer comment: assist for steadying especially when powering up from lower surfaces, sit<>stand x4 from recliner x2 and BSC x2.    Ambulation/Gait Ambulation/Gait assistance: Min guard Gait Distance (Feet): 20 Feet (+10 to and from [facility]) Assistive device: Rolling walker (2 wheels) Gait Pattern/deviations: Step-through pattern, Decreased stride length, Trunk flexed Gait velocity: decr     General Gait Details: cues for upright posture, placement in RW especially during directional changes  Stairs            Wheelchair Mobility    Modified Rankin (Stroke Patients Only)       Balance Overall balance assessment: Needs assistance, History of Falls Sitting-balance support: Bilateral upper extremity supported, Feet supported Sitting balance-Leahy Scale: Fair     Standing balance support: Bilateral upper extremity supported, Reliant on assistive device for balance Standing balance-Leahy Scale: Poor                               Pertinent Vitals/Pain Pain Assessment Pain Assessment: Faces Faces Pain Scale: Hurts little more Pain Location: bilat knees R>L Pain Descriptors / Indicators: Aching, Discomfort, Grimacing Pain Intervention(s): Limited activity within patient's tolerance, Monitored during session, Repositioned    Home Living Family/patient expects to be discharged to:: Private residence Living Arrangements: Alone Available Help at Discharge: Family;Friend(s);Available PRN/intermittently Type of Home: Apartment Home Access: Stairs to enter Entrance Stairs-Rails: Right Entrance Stairs-Number of Steps: 6 steps that are rather  uneven, where some are normal steps and some more like a platform. from parking lot.   Home Layout: One level Home Equipment: Rollator (4 wheels);Cane - single point;BSC/3in1;Shower  seat;Hand held shower head;Grab bars - tub/shower;Adaptive equipment      Prior Function Prior Level of Function : Independent/Modified Independent             Mobility Comments: Uses rollator in community, standard walker in home ADLs Comments: Indep with adaptive equipment     Hand Dominance   Dominant Hand: Right    Extremity/Trunk Assessment   Upper Extremity Assessment Upper Extremity Assessment: Defer to OT evaluation    Lower Extremity Assessment Lower Extremity Assessment: Generalized weakness (3+/5 throughout, knee extension testing limited by chronic knee pain)    Cervical / Trunk Assessment Cervical / Trunk Assessment: Kyphotic  Communication   Communication: No difficulties  Cognition Arousal/Alertness: Awake/alert Behavior During Therapy: WFL for tasks assessed/performed Overall Cognitive Status: Within Functional Limits for tasks assessed                                          General Comments General comments (skin integrity, edema, etc.): VSS on RA    Exercises     Assessment/Plan    PT Assessment Patient needs continued PT services  PT Problem List Decreased strength;Decreased mobility;Decreased activity tolerance;Decreased balance;Decreased knowledge of use of DME;Pain;Decreased skin integrity;Obesity       PT Treatment Interventions DME instruction;Therapeutic activities;Gait training;Therapeutic exercise;Balance training;Stair training;Functional mobility training;Patient/family education;Neuromuscular re-education    PT Goals (Current goals can be found in the Care Plan section)  Acute Rehab PT Goals Patient Stated Goal: home PT Goal Formulation: With patient Time For Goal Achievement: 08/05/22 Potential to Achieve Goals: Good    Frequency Min 3X/week     Co-evaluation               AM-PAC PT "6 Clicks" Mobility  Outcome Measure Help needed turning from your back to your side while in a flat bed without  using bedrails?: A Lot Help needed moving from lying on your back to sitting on the side of a flat bed without using bedrails?: A Lot Help needed moving to and from a bed to a chair (including a wheelchair)?: A Lot Help needed standing up from a chair using your arms (e.g., wheelchair or bedside chair)?: A Little Help needed to walk in hospital room?: A Little Help needed climbing 3-5 steps with a railing? : A Lot 6 Click Score: 14    End of Session   Activity Tolerance: Patient limited by fatigue;Patient limited by pain Patient left: in chair;with call bell/phone within reach;Other (comment) (pt aware to press call button and wait for assist before mobilizing) Nurse Communication: Mobility status PT Visit Diagnosis: Muscle weakness (generalized) (M62.81);Other abnormalities of gait and mobility (R26.89)    Time: 9528-4132 PT Time Calculation (min) (ACUTE ONLY): 30 min   Charges:   PT Evaluation $PT Eval Low Complexity: 1 Low PT Treatments $Therapeutic Activity: 8-22 mins      Stacie Glaze, PT DPT Acute Rehabilitation Services Pager [phone]  Office [phone]   [place] 07/22/2022, 3:10 PM

## 2022-07-22 NOTE — Progress Notes (Signed)
Inpatient Rehab Admissions Coordinator:   Per therapy recommendation, patient was screened for CIR candidacy by Clemens Catholic, MS, CCC-SLP. At this time, Pt. does not appear to demonstrate medical necessity to justify in hospital rehabilitation/CIR. Additionally, Pt.'s payor source will not approve CIR for this diagnosis. I will not pursue a rehab consult for this Pt.   Recommend other rehab venues to be pursued.  Please contact me with any questions.  Clemens Catholic, [place], [place] Admissions Coordinator  [phone] ([place]) [phone] (office)

## 2022-07-22 NOTE — Progress Notes (Signed)
Progress Note  Patient: Maria [name] VVO:[id number] DOB: 05-27-1956  DOA: 07/20/2022  DOS: 07/22/2022    Brief hospital course: Briawna Carver Crumby is a 66 y.o. female with a history of lymphedema, morbid obesity, stage IIIa CKD, chronic HFpEF (LVEF 65-70%, G2DD, normal size/function of RV on echo Oct 2022), T2DM, dyslipidemia who presented to the ED with progressive painful swelling in legs despite augmenting oral diuretic, and dysuria. She was diagnosed with cellulitis and UTI, started vancomycin and ceftriaxone, as well as IV lasix, and admitted  with cardiology consultation.  Assessment and Plan: Acute on chronic HFpEF:  - Having subjectively good diuresis (incompletely documented) and decline in weight, will continue diuresis since creatinine stable and will seek new EDW.  - Holding SGLT2i with concurrent UTI.  - Dietary salt and fluid restriction reviewed.  - ?if will need slightly higher dose of diuretic with progressive renal disease. - Appreciate cardiology assessments.   UTI: Culture growing GNRs.  - Continue ceftriaxone pending culture  Cellulitis of bilateral lower legs:  - Continue ceftriaxone, change vancomycin to doxycycline in AM (received daily dose this AM) if continuing to improve. - Continue opioid pain control (for chronic back pain/chronic pain syndrome), will change back to pain management regimen as outpatient which is '10mg'$  q4h prn. Given acute pain component, will not DC dilaudid at this time, but d/w pt in AM Re: deescalation.  Hypokalemia:  - Supplement BID today given ongoing diuresis and monitor with mag in AM. Will need to continue with standing supplement at discharge.   Leg swelling: Multifactorial, prominent component of venous insufficiency, lymphedema is suspected.  - Keep legs elevated - Diuresis as above  Morbid obesity: Body mass index is 61.82 kg/m.  - Strongly consider bariatric surgery evaluation as an outpatient while she is a surgical candidate.    T2DM: Diet-controlled. Last HbA1c 6.9% indicating adequate control with no home medications listed.  - SSI ordered, none required to maintain inpatient goal.  Dyslipidemia: Not on medications for this, I believe. LDL is 56.  HTN: BP listed as hypotensive overnight, suspect erroneous readings as these have been normal at home and normal on recheck with appropriate cuff in the upper arm.  - Continue monitoring, ok to continue metoprolol.  AKI on stage IIIa CKD: AKI likely related to renal congestion as CrCl improving with diuresis. - Monitor closely, avoid nephrotoxins. Will deescalate vancomycin to doxycycline as above - No current indication for acute consultation, though will need to continue longitudinal follow up with [facility], Dr. Royce Macadamia.  Weakness:  - PT/OT consults requested.   Anxiety:  - Restart home prn clonazepam BID  Suspected false positive HIV serology: Pt reports has had positive result in the past with negative confirmatory testing per PCP. - D/w ID, will get reflex testing to confirm negativity.   GERD:  - Continue PPI   Subjective: Having low back pain, wanted to get into recliner, but hasn't been out of bed yet. Subjectively making plenty of urine, notes swelling in legs is better, though they still burn bilaterally.   Objective: Vitals:   07/22/22 0500 07/22/22 0728 07/22/22 0932 07/22/22 0934  BP:  (!) 114/42 (!) 128/37 (!) 117/44  Pulse:  (!) 59 69   Resp:      Temp:  98.2 F (36.8 C)    TempSrc:  Oral    SpO2:  95% 100%   Weight: (!) 158.3 kg      Gen: Pleasant female in no distress Pulm: Nonlabored breathing room air.  Clear CV: Regular rate and rhythm. Slight early systolic murmur at base without other murmur, rub, or gallop. No HJR or JVD on limited exam, Improved dependent edema. GI: Abdomen soft, obese, +mild suprapubic and RLQ tenderness without rebound or distention, +BS.  Ext/skin: Lower legs with stable erythema slightly regressed proximally,  less intense with less weeping. No purulence currently noted. No fluctuance.   Data Personally reviewed: WBC 9.6k SCr 2 > 2.6 >> 1.5  Family Communication: None at bedside  Disposition: Status is: Inpatient due to need for IV diuresis. PT/OT consult pending. Planned Discharge Destination: Skilled nursing facility      Patrecia Pour, MD 07/22/2022 12:27 PM Page by [email or web address]

## 2022-07-22 NOTE — TOC Initial Note (Signed)
Transition of Care [facility]) - Initial/Assessment Note    Patient Details  Name: Maria [name] MRN: [id number] Date of Birth: 1956/06/22  Transition of Care [facility]) CM/SW Contact:    Curlene Labrum, RN Phone Number: 07/22/2022, 2:59 PM  Clinical Narrative:                 CM met with the patient at the bedside to discuss needs at the home and transitions of care after hospitalization.  The patient states that she lives by herself and has been falling frequently at her home.  The patient states that she has a sister that lives in [place], but she is unable to assister her since she is disabled herself.  OT was present in the room during this discussion.  The patient was open for rehabilitation services since she states she lives by herself at the home.  The patient has a can, RW and raised toilet seat at the house.  No home health services are active with this patient at this time.  [facility] has given services to the patient in the recent past.  OT has completed evaluation and I'm waiting on PT note but will reach out to [facility] or [facility] or [facility] rehabilitation facilities if unable to provide here locally.  CM will continue to follow the patient for discharge needs.  Expected Discharge Plan: IP Rehab Facility Barriers to Discharge: Continued Medical Work up   Patient Goals and CMS Choice Patient states their goals for this hospitalization and ongoing recovery are:: Patient wants to get better and improve her mobility at home. CMS Medicare.gov Compare Post Acute Care list provided to:: Patient Choice offered to / list presented to : Patient  Expected Discharge Plan and Services Expected Discharge Plan: [place]   Discharge Planning Services: CM Consult Post Acute Care Choice: IP Rehab Living arrangements for the past 2 months: Apartment                                      Prior Living Arrangements/Services Living arrangements for  the past 2 months: Apartment Lives with:: Self Patient language and need for interpreter reviewed:: Yes Do you feel safe going back to the place where you live?: Yes      Need for Family Participation in Patient Care: Yes (Comment) Care giver support system in place?: Yes (comment) Current home services: DME Criminal Activity/Legal Involvement Pertinent to Current Situation/Hospitalization: No - Comment as needed  Activities of Daily Living      Permission Sought/Granted Permission sought to share information with : Case Manager Permission granted to share information with : Yes, Verbal Permission Granted     Permission granted to share info w AGENCY: Inpatient rehabilitation facility        Emotional Assessment Appearance:: Appears stated age Attitude/Demeanor/Rapport: Gracious Affect (typically observed): Accepting Orientation: : Oriented to Self, Oriented to Place, Oriented to  Time, Oriented to Situation Alcohol / Substance Use: Not Applicable, Never Used Psych Involvement: No (comment)  Admission diagnosis:  Acute cystitis with hematuria [N30.01] AKI (acute kidney injury) ([place]) [N17.9] Fluid overload [E87.70] Cellulitis of lower extremity, unspecified laterality [O70.786] Patient Active Problem List   Diagnosis Date Noted   UTI (urinary tract infection) 07/22/2022   Weakness 07/22/2022   Fluid overload 07/21/2022   Acute cystitis with hematuria    Dyspnea 12/19/2021   Leg swelling 11/20/2021  Bilateral knee pain 10/29/2021   Hypernatremia 09/24/2021   Cellulitis of lower extremity 09/24/2021   CKD (chronic kidney disease), stage IIIa ([place]) 09/24/2021   Sacral decubitus ulcer, stage II ([place]) 09/20/2021   Acute on chronic diastolic CHF (congestive heart failure) ([place]) 09/19/2021   Nail disorder 07/07/2021   Pre-ulcerative corn or callous 07/07/2021   Rash 07/07/2021   Acute kidney injury superimposed on chronic kidney disease ([place]) 15/17/6160   Diastolic  congestive heart failure ([place]) 03/17/2021   Diabetes mellitus type 2 in obese ([place]) 03/11/2021   Vitamin D deficiency 01/01/2021   Medial epicondylitis 12/31/2020   Aortic atherosclerosis ([place]) 73/71/0626   Umbilical hernia 94/85/4627   Constipation 08/05/2020   Arthritis 06/12/2020   Hoarseness 05/01/2020   Vitamin B 12 deficiency 01/31/2020   Rosacea 01/31/2020   Neck swelling 01/31/2020   History of colonic polyps    Benign neoplasm of [place]    Nausea 08/31/2019   Throat pain 07/26/2019   HLD (hyperlipidemia) 05/24/2019   Leg cramps 05/24/2019   Lump in neck 05/24/2019   Left thyroid nodule 01/25/2019   Jerking movements of extremities 07/20/2018   Balance disorder 07/20/2018   Right knee pain 03/21/2018   OSA (obstructive sleep apnea) 03/21/2018   Oxygen desaturation 03/21/2018   Urinary symptom or sign 03/21/2018   Lymphedema of both legs 01/25/2018   Gait disorder 01/25/2018   Dysphonia 12/07/2017   Encounter for well adult exam with abnormal findings 11/08/2017   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   [place]le polyp 08/11/2016   Abdominal pain, epigastric    Dysphagia    [place] cancer screening    ACE-inhibitor cough 12/05/2015   Restless legs syndrome 10/08/2015   Venous stasis dermatitis of both lower extremities 04/26/2015   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/17/2014   AR (allergic rhinitis) 10/17/2014   Fatty liver 10/17/2014   Chronic pain syndrome 09/19/2014   Post-traumatic osteoarthritis of both knees 09/19/2014   Spinal stenosis 09/19/2014   Depression, major, single episode, complete remission ([place]) 09/19/2014   Narcotic dependence ([place]) 09/19/2014   False positive HIV serology 06/02/2013   Hypokalemia 06/29/2012   Spondylosis of 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 03/09/2012   Lumbar disc disease 03/09/2012   Chronic pain 03/09/2012   Morbid obesity due to excess calories ([place]) 06/17/2011   Chronic low back pain 12/28/2008    Anxiety state 12/06/2007   Depression 12/06/2007   HTN (hypertension) 12/06/2007   Gastroesophageal reflux disease without esophagitis 12/06/2007   LOW BACK PAIN 12/06/2007   PCP:  Carson Myrtle, NP Pharmacy:   [facility] [name] [place], [place] - [place] [address] Phone: [phone] Fax: [phone]  [facility] [phone] [place], [place] - [address] [place] [address] Phone: [phone] Fax: [place], [place]  [place] [address] [place] [address] Phone: [phone] Fax: [phone]  [facility] [place], [address] [place] [address] Phone: [phone] Fax: [phone]     Social Determinants of Health (SDOH) Interventions    Readmission Risk Interventions    07/22/2022    2:59 PM 04/11/2021   11:40 AM  Readmission Risk Prevention Plan  Transportation Screening Complete Complete  PCP or Specialist Appt within 3-5 Days Complete Complete  HRI or Home Care Consult Complete  Complete  Social Work Consult for [place] Planning/Counseling Complete Complete  Palliative Care Screening Complete Not Applicable  Medication Review Press photographer) Complete Complete

## 2022-07-23 DIAGNOSIS — F411 Generalized anxiety disorder: Secondary | ICD-10-CM | POA: Diagnosis not present

## 2022-07-23 DIAGNOSIS — I5033 Acute on chronic diastolic (congestive) heart failure: Secondary | ICD-10-CM | POA: Diagnosis not present

## 2022-07-23 DIAGNOSIS — N179 Acute kidney failure, unspecified: Secondary | ICD-10-CM | POA: Diagnosis not present

## 2022-07-23 DIAGNOSIS — L03119 Cellulitis of unspecified part of limb: Secondary | ICD-10-CM | POA: Diagnosis not present

## 2022-07-23 LAB — BASIC METABOLIC PANEL
Anion gap: 11 (ref 5–15)
BUN: 19 mg/dL (ref 8–23)
CO2: 29 mmol/L (ref 22–32)
Calcium: 8.1 mg/dL — ABNORMAL LOW (ref 8.9–10.3)
Chloride: 97 mmol/L — ABNORMAL LOW (ref 98–111)
Creatinine, Ser: 1.5 mg/dL — ABNORMAL HIGH (ref 0.44–1.00)
GFR, Estimated: 38 mL/min — ABNORMAL LOW (ref >60)
Glucose, Bld: 103 mg/dL — ABNORMAL HIGH (ref 70–99)
Potassium: 2.8 mmol/L — ABNORMAL LOW (ref 3.5–5.1)
Sodium: 137 mmol/L (ref 135–145)

## 2022-07-23 LAB — GLUCOSE, CAPILLARY
Glucose-Capillary: 90 mg/dL (ref 70–99)
Glucose-Capillary: 147 mg/dL — ABNORMAL HIGH (ref 70–99)
Glucose-Capillary: 108 mg/dL — ABNORMAL HIGH (ref 70–99)
Glucose-Capillary: 130 mg/dL — ABNORMAL HIGH (ref 70–99)

## 2022-07-23 LAB — HIV-1/HIV-2 QUALITATIVE RNA
Final Interpretation: NEGATIVE
HIV-1 RNA, Qualitative: NONREACTIVE
HIV-2 RNA, Qualitative: NONREACTIVE

## 2022-07-23 LAB — URINE CULTURE: Culture: >=100000 — AB

## 2022-07-23 LAB — HIV-1/2 AB - DIFFERENTIATION
HIV 1 Ab: NONREACTIVE
HIV 2 Ab: NONREACTIVE
Note: NEGATIVE

## 2022-07-23 LAB — MAGNESIUM: Magnesium: 1.8 mg/dL (ref 1.7–2.4)

## 2022-07-23 MED ORDER — CEFAZOLIN SODIUM-DEXTROSE 1-4 GM/50ML-% IV SOLN
1.0000 g | Freq: Three times a day (TID) | INTRAVENOUS | Status: DC
  Administered 2022-07-23 – 2022-07-24 (×2): 1 g via INTRAVENOUS
  Filled 2022-07-23 (×2): qty 50

## 2022-07-23 MED ORDER — POTASSIUM CHLORIDE CRYS ER 20 MEQ PO TBCR
40.0000 meq | EXTENDED_RELEASE_TABLET | Freq: Three times a day (TID) | ORAL | Status: AC
  Administered 2022-07-23 (×3): 40 meq via ORAL
  Filled 2022-07-23 (×3): qty 2

## 2022-07-23 MED ORDER — MAGNESIUM SULFATE IN D5W 1-5 GM/100ML-% IV SOLN
1.0000 g | Freq: Once | INTRAVENOUS | Status: AC
  Administered 2022-07-23: 1 g via INTRAVENOUS
  Filled 2022-07-23: qty 100

## 2022-07-23 NOTE — Progress Notes (Addendum)
Pharmacy Antibiotic Note  Maria [name] is a 66 y.o. female admitted on 07/20/2022 with weakness, concern of UTI + BLE cellulitis.  Pharmacy has been consulted for cefazolin dosing - also on doxycycline and today is day 3 of abx.   Renal function improved and SCr down to 1.5 (baseline ~1.1). She is afebrile and WBC are normal. Urine showing >100K K pneumo sensitive to cefazolin.  Plan: Cefazolin 1 g IV q8h Recommend 5-7 days of abx Pharmacy signing off - will monitor preipherally for renal dose adjustments as needed  Weight: (!) 161.1 kg (355 lb 2.6 oz)  Temp (24hrs), Avg:98.2 F (36.8 C), Min:98.2 F (36.8 C), Max:98.2 F (36.8 C)  Recent Labs  Lab 07/17/22 1536 07/20/22 1429 07/21/22 0550 07/22/22 0638 07/23/22 0546  WBC  --  9.6 9.2  --   --   CREATININE 2.07* 2.60* 1.92* 1.58* 1.50*  LATICACIDVEN  --  1.3  --   --   --      Estimated Creatinine Clearance: 56.6 mL/min (A) (by C-G formula based on SCr of 1.5 mg/dL (H)).    Allergies  Allergen Reactions   Amoxicillin-Pot Clavulanate Nausea And Vomiting    Projectile vomiting Did it involve swelling of the face/tongue/throat, SOB, or low BP? No Did it involve sudden or severe rash/hives, skin peeling, or any reaction on the inside of your mouth or nose? No Did you need to seek medical attention at a hospital or doctor's office? No When did it last happen?      20-30 years If all above answers are "NO", may proceed with cephalosporin use.    Adhesive [Tape] Other (See Comments)    Tears skin off - use paper tape    Codeine Other (See Comments)    hallucinations   Crestor [Rosuvastatin Calcium]     Severe muscle cramps   Morphine And Related Nausea And Vomiting and Other (See Comments)    Migraine headaches   Vicodin [Hydrocodone-Acetaminophen] Other (See Comments)    hallucinations   Latex Rash    Antimicrobials this admission: Vanc 8/1 >> 8/2 Doxycycline 8/2 >> CTX 8/1 >> 8/3 Cefazolin 8/3 >>  Dose  adjustments this admission:  Microbiology results: 8/1 UCx: >100K Kleb pneumo (R-amp, I-NTF, S-cefaz)  Thank you for involving pharmacy in this patient's care.  Renold Genta, PharmD, BCPS Clinical Pharmacist Clinical phone for 07/23/2022 until 3p is [phone] 07/23/2022 11:55 AM

## 2022-07-23 NOTE — TOC Progression Note (Addendum)
Transition of Care [facility]) - Progression Note    Patient Details  Name: Maria [name] MRN: [id number] Date of Birth: 1956/07/04  Transition of Care [facility]) CM/SW [place], RN Phone Number: 07/23/2022, 10:10 AM  Clinical Narrative:    CM met with the patient at the bedside and discussed that [facility] was unable to offer a bed for admission to the patient.  I called and spoke with Sherri Rad, [facility] in [facility] and she is willing to meet with the patient and have medical director at the facility review possible bed offer at the facility.  The patient has a managed medicare and insurance authorization would have to be approved for placement.  The patient states that she would prefer Inpatient rehabilitation but if unable to qualify - home health may be back up plan since the patient was hesitant about SNF placement.  The cardiologist asked if patient would consider a lift chair for home - the patient states that her financial resources may not allow her to afford.  I called and left a message with [employer] to see if her Medicare would assist with the cost.  The patient previous home health provider was [facility] - I left a message with Marisue Brooklyn for follow up.  CM will continue to follow the patient for TOC needs.  07/23/2022 1443 - I spoke with Sherri Rad, [facility] and she is having medical director review patient's clinicals for bed offer and will start insurance authorization through her provider.  CM will continue to follow the patient for TOC needs.  I called [employer] and they do not sell LIft chair Recliners at this time.  Medicare usually covers the cost of tax and the motor and price of chair is usually >1000.00 for new chair.  The patient states that she has limited resources at this time.  I called [facility] in [place] and they have gently used LIft chairs and gave the information  to the patient to have family/ friend look at the store for her since the cost is 200- 300 dollars.  CM will continue to follow the patient for Inpatient Rehabilitation through [facility] - pending insurance authorization - otherwise will pursue home health.   Expected Discharge Plan: IP Rehab Facility Barriers to Discharge: Continued Medical Work up  Expected Discharge Plan and Services Expected Discharge Plan: [place]   Discharge Planning Services: CM Consult Post Acute Care Choice: IP Rehab Living arrangements for the past 2 months: Apartment                                       Social Determinants of Health (SDOH) Interventions    Readmission Risk Interventions    07/22/2022    2:59 PM 04/11/2021   11:40 AM  Readmission Risk Prevention Plan  Transportation Screening Complete Complete  PCP or Specialist Appt within 3-5 Days Complete Complete  HRI or [place] Complete Complete  Social Work Consult for [place] Planning/Counseling Complete Complete  Palliative Care Screening Complete Not Applicable  Medication Review Press photographer) Complete Complete

## 2022-07-23 NOTE — Progress Notes (Signed)
Progress Note  Patient Name: Maria [name] Date of Encounter: 07/23/2022  Primary Cardiologist:   Minus Breeding, MD   Subjective   Mild leg pain.  No acute SOB.   Inpatient Medications    Scheduled Meds:  clotrimazole   Topical BID   doxycycline  100 mg Oral Q12H   furosemide  40 mg Intravenous Q12H   heparin  5,000 Units Subcutaneous Q8H   insulin aspart  0-15 Units Subcutaneous TID WC   insulin aspart  0-5 Units Subcutaneous QHS   metoprolol tartrate  25 mg Oral BID   pantoprazole  40 mg Oral Daily   potassium chloride  40 mEq Oral TID   pramipexole  0.5 mg Oral TID   Continuous Infusions:  cefTRIAXone (ROCEPHIN)  IV 2 g (07/22/22 2304)   magnesium sulfate bolus IVPB 1 g (07/23/22 0750)   PRN Meds: acetaminophen **OR** acetaminophen, clonazePAM, HYDROmorphone (DILAUDID) injection, metoprolol tartrate, ondansetron **OR** ondansetron (ZOFRAN) IV, oxyCODONE, senna-docusate   Vital Signs    Vitals:   07/22/22 0934 07/22/22 1947 07/23/22 0500 07/23/22 0728  BP: (!) 117/44 (!) 110/52  (!) 120/46  Pulse:  60  (!) 56  Resp:  18  16  Temp:  98.2 F (36.8 C)  98.2 F (36.8 C)  TempSrc:  Oral  Oral  SpO2:  97%  95%  Weight:   (!) 161.1 kg     Intake/Output Summary (Last 24 hours) at 07/23/2022 6010 Last data filed at 07/23/2022 0804 Gross per 24 hour  Intake 1593.39 ml  Output 2030 ml  Net -436.61 ml   Filed Weights   07/22/22 0500 07/23/22 0500  Weight: (!) 158.3 kg (!) 161.1 kg    Telemetry    NA - Personally Reviewed  ECG    NA - Personally Reviewed  Physical Exam   GEN: No  acute distress.   Neck: No  JVD Cardiac: RRR, 2/6 apical systolic murmur, no diastolic murmurs, rubs, or gallops.  Respiratory: Clear   to auscultation bilaterally. GI: Soft, nontender, non-distended, normal bowel sounds  MS:  Moderate to severe edema; No deformity. Neuro:   Nonfocal  Psych: Oriented and appropriate    Labs    Chemistry Recent Labs  Lab 07/20/22 1429  07/21/22 0550 07/22/22 0638 07/23/22 0546  NA 140 138 138 137  K 5.0 3.7 3.0* 2.8*  CL 102 100 100 97*  CO2 '29 28 28 29  '$ GLUCOSE 123* 124* 109* 103*  BUN 31* 26* 21 19  CREATININE 2.60* 1.92* 1.58* 1.50*  CALCIUM 9.1 8.6* 8.1* 8.1*  PROT 7.3  --   --   --   ALBUMIN 3.6  --   --   --   AST 24  --   --   --   ALT 25  --   --   --   ALKPHOS 63  --   --   --   BILITOT 1.4*  --   --   --   GFRNONAA 20* 29* 36* 38*  ANIONGAP '9 10 10 11     '$ Hematology Recent Labs  Lab 07/20/22 1429 07/21/22 0550  WBC 9.6 9.2  RBC 3.95 3.68*  HGB 11.9* 11.1*  HCT 37.0 34.0*  MCV 93.7 92.4  MCH 30.1 30.2  MCHC 32.2 32.6  RDW 14.7 14.6  PLT 257 240    Cardiac EnzymesNo results for input(s): "TROPONINI" in the last 168 hours. No results for input(s): "TROPIPOC" in the last 168 hours.  BNPNo results for input(s): "BNP", "PROBNP" in the last 168 hours.   DDimer No results for input(s): "DDIMER" in the last 168 hours.   Radiology    No results found.  Cardiac Studies   NA  Patient Profile     66 y.o. female with chronic diastolic dysfunction and CKD who presents with leg swelling, pain, cellulitis.    Assessment & Plan    Acute on chronic diastolic dysfunction:     I/O incomplete still.   Weight is unchanged.   Creat continues to fall.  My suggestion is to continue IV diuresis as long as she is here as she is so difficult to control at home.  Agree with Wilder Glade not being given at this time.   I did check with nursing and she put out over 900 cc this AM.  Talked with caseworker.  We do have home lab draws we can use.  I wonder if there is a better chair or hospital bed we could use for her to help her keep her feet up.    Hypokalemia:  Given 80 meq KDur yesterday and 120 meq ordered for today.  Follow level in AM.    HTN:  BP at target.    CKD IIIB:  Creat continues to fall slightly.    For questions or updates, please contact [place] Please consult [email or web address] for  contact info under Cardiology/STEMI.   Signed, Minus Breeding, MD  07/23/2022, 8:33 AM

## 2022-07-23 NOTE — Progress Notes (Signed)
Progress Note  Patient: Maria [name] QIO:[id number] DOB: July 10, 1956  DOA: 07/20/2022  DOS: 07/23/2022    Brief hospital course: Maria [name] is a 66 y.o. female with a history of lymphedema, morbid obesity, stage IIIa CKD, chronic HFpEF (LVEF 65-70%, G2DD, normal size/function of RV on echo Oct 2022), T2DM, dyslipidemia who presented to the ED with progressive painful swelling in legs despite augmenting oral diuretic, and dysuria. She was diagnosed with cellulitis and UTI, started vancomycin and ceftriaxone, as well as IV lasix, and admitted with cardiology consultation.  Assessment and Plan: Acute on chronic HFpEF:  - Having subjectively good diuresis (documented to be roughly net even over past 24 hours. Weight appears to be up but still below priors. Appreciate cardiology insights, will continue IV diuresis since creatinine stable and will seek new EDW.  - Holding SGLT2i with concurrent UTI.  - Dietary salt and fluid restriction reviewed.  - ?if will need slightly higher dose of diuretic with progressive renal disease. - Appreciate cardiology assessments.   UTI: Culture growing Klebsiella - Continue ceftriaxone pending culture  Cellulitis of bilateral lower legs:  - Continue ceftriaxone, change vancomycin to doxycycline in AM (received daily dose this AM) if continuing to improve. - Continue opioid pain control (for chronic back pain/chronic pain syndrome), will change back to pain management regimen as outpatient which is '10mg'$  q4h prn. Given acute pain component, also continuing dilaudid at this time   Hypokalemia: Refractory to supplementation - Augment to 163mq total daily dose today, supp Mag 1g.    Leg swelling: Multifactorial, prominent component of venous insufficiency, lymphedema is suspected.  - Keep legs elevated, consider unna boots once not having to monitor cellulitis daily. - Diuresis as above  Morbid obesity: Body mass index is 62.91 kg/m.  - Strongly consider  bariatric surgery evaluation as an outpatient while she is a surgical candidate.   T2DM: Diet-controlled. Last HbA1c 6.9% indicating adequate control with no home medications listed.  - SSI ordered, none required to maintain inpatient goal.  Dyslipidemia: Not on medications for this, I believe. LDL is 56.  HTN: BP listed as hypotensive overnight, suspect erroneous readings as these have been normal at home and normal on recheck with appropriate cuff in the upper arm.  - Continue monitoring, ok to continue metoprolol.  AKI on stage IIIa CKD: AKI likely related to renal congestion as CrCl improving with diuresis. Settling at 1.5, recent baseline close to 1.2-1.3.  - Monitor closely, avoid nephrotoxins.   - No current indication for acute consultation, though will need to continue longitudinal follow up with [facility], Dr. FRoyce [name]  Weakness:  - PT/OT consults requested. Given the increasing frequency of falls and progressive muscular deconditioning, CIR is recommended and being pursued. I believe SNF would alternatively serve the patient better than returning home alone immediately from hospital.   Anxiety:  - Restarted home prn clonazepam BID  False positive HIV serology: Pt reports has had positive result in the past with negative confirmatory testing per PCP. HIV-1 RNA here negative.  - Would not recommend repeat screening this patient in the future as she is low risk for contracting the virus and testing is fraught.  GERD:  - Continue PPI   Subjective: Continue with back pain, right sided arm, trunk, leg pain worse when she was getting up yesterday. The right side has hurt generally since her fall, stable. No fevers.  Objective: Vitals:   07/22/22 1947 07/23/22 0500 07/23/22 0728 07/23/22 0944  BP: (!) 110/52  (Marland Kitchen  120/46 (!) 114/41  Pulse: 60  (!) 56 (!) 57  Resp: 18  16   Temp: 98.2 F (36.8 C)  98.2 F (36.8 C)   TempSrc: Oral  Oral   SpO2: 97%  95%   Weight:  (!) 161.1 kg      Gen: 66 y.o. female in no distress Pulm: Nonlabored breathing room air. Clear anteriorly and posteriorly. CV: Regular rate and rhythm. soft early systolic low pitched murmur. No other/new murmur, rub, or gallop. Stable pitting and nonpitting LE edema. GI: Abdomen soft, non-tender, non-distended, with normoactive bowel sounds.  Ext: Warm, no deformities Skin: Confluent erythema is less warm and more pale than previous exams, still with some ruptured blisters with no further exudate, no fluctuance. Swelling locally improved.  Neuro: Alert and oriented. No focal neurological deficits. Psych: Judgement and insight appear fair. Mood euthymic & affect congruent. Behavior is appropriate.    Data Personally reviewed: WBC 9.6k SCr 2 > 2.6 >> 1.5 K consistently low, 2.8 today  Family Communication: None at bedside  Disposition: Status is: Inpatient due to need for IV diuresis. PT/OT consulted > pursuing placement at non-[facility] at this time. SNF as second option, ultimately home with [facility] after that.    Maria Pour, MD 07/23/2022 11:32 AM Page by [name]

## 2022-07-24 DIAGNOSIS — I5033 Acute on chronic diastolic (congestive) heart failure: Secondary | ICD-10-CM | POA: Diagnosis not present

## 2022-07-24 DIAGNOSIS — F411 Generalized anxiety disorder: Secondary | ICD-10-CM | POA: Diagnosis not present

## 2022-07-24 DIAGNOSIS — N179 Acute kidney failure, unspecified: Secondary | ICD-10-CM | POA: Diagnosis not present

## 2022-07-24 DIAGNOSIS — L03119 Cellulitis of unspecified part of limb: Secondary | ICD-10-CM | POA: Diagnosis not present

## 2022-07-24 LAB — BASIC METABOLIC PANEL
Anion gap: 11 (ref 5–15)
BUN: 16 mg/dL (ref 8–23)
CO2: 30 mmol/L (ref 22–32)
Calcium: 8.3 mg/dL — ABNORMAL LOW (ref 8.9–10.3)
Chloride: 99 mmol/L (ref 98–111)
Creatinine, Ser: 1.09 mg/dL — ABNORMAL HIGH (ref 0.44–1.00)
GFR, Estimated: 56 mL/min — ABNORMAL LOW (ref >60)
Glucose, Bld: 131 mg/dL — ABNORMAL HIGH (ref 70–99)
Potassium: 3 mmol/L — ABNORMAL LOW (ref 3.5–5.1)
Sodium: 140 mmol/L (ref 135–145)

## 2022-07-24 LAB — GLUCOSE, CAPILLARY
Glucose-Capillary: 121 mg/dL — ABNORMAL HIGH (ref 70–99)
Glucose-Capillary: 150 mg/dL — ABNORMAL HIGH (ref 70–99)
Glucose-Capillary: 108 mg/dL — ABNORMAL HIGH (ref 70–99)
Glucose-Capillary: 149 mg/dL — ABNORMAL HIGH (ref 70–99)

## 2022-07-24 MED ORDER — POTASSIUM CHLORIDE CRYS ER 20 MEQ PO TBCR
40.0000 meq | EXTENDED_RELEASE_TABLET | Freq: Four times a day (QID) | ORAL | Status: AC
  Administered 2022-07-24 (×4): 40 meq via ORAL
  Filled 2022-07-24 (×4): qty 2

## 2022-07-24 MED ORDER — CEPHALEXIN 500 MG PO CAPS
500.0000 mg | ORAL_CAPSULE | Freq: Four times a day (QID) | ORAL | Status: AC
  Administered 2022-07-24 – 2022-07-27 (×14): 500 mg via ORAL
  Filled 2022-07-24 (×14): qty 1

## 2022-07-24 MED ORDER — MAGNESIUM SULFATE IN D5W 1-5 GM/100ML-% IV SOLN
1.0000 g | Freq: Once | INTRAVENOUS | Status: AC
  Administered 2022-07-24: 1 g via INTRAVENOUS
  Filled 2022-07-24: qty 100

## 2022-07-24 NOTE — Progress Notes (Signed)
Progress Note  Patient: Maria [name] ASN:[id number] DOB: 1956-11-10  DOA: 07/20/2022  DOS: 07/24/2022    Brief hospital course: Maria [name] is a 66 y.o. female with a history of lymphedema, morbid obesity, stage IIIa CKD, chronic HFpEF (LVEF 65-70%, G2DD, normal size/function of RV on echo Oct 2022), T2DM, dyslipidemia who presented to the ED with progressive painful swelling in legs despite augmenting oral diuretic, and dysuria. She was diagnosed with cellulitis and UTI, started vancomycin and ceftriaxone, as well as IV lasix, and admitted with cardiology consultation.  Assessment and Plan: Acute on chronic HFpEF:  - Weight appears to be up but still below priors. Appreciate cardiology insights, will continue IV diuresis since creatinine stable and will seek new EDW.  - Holding SGLT2i with concurrent UTI.  - Dietary salt and fluid restriction reviewed.  - Appreciate cardiology assessments. Patient is likely optimized now that she's been given consistent diuretics. Defer to cardiology Re: discharge regimen.  Klebsiella UTI: resistant to amp and macrobid.  - Continue keflex  Cellulitis of bilateral lower legs:  - Continue antibiotics, will narrow and switch to oral keflex and doxycycline.  - Continue opioid pain control (for chronic back pain/chronic pain syndrome), oxycodone '10mg'$  q4h prn. Given acute pain component, also continuing dilaudid at this time   Hypokalemia: Refractory to supplementation - Augment to 11mq total daily dose today, supp Mag 1g, check in AM.    Leg swelling: Multifactorial, prominent component of venous insufficiency, lymphedema is suspected.  - Keep legs elevated, this is still not being done well. Also needs compression. - Diuresis as above  Morbid obesity: Body mass index is 62.84 kg/m.  - Strongly consider bariatric surgery evaluation as an outpatient while she is a surgical candidate.   T2DM: Diet-controlled. Last HbA1c 6.9% indicating adequate  control with no home medications listed.  - SSI ordered, none required to maintain inpatient goal.  Dyslipidemia: Not on medication, note intolerance to rosuvastatin. LDL is 56.  HTN: BP listed as hypotensive overnight, suspect erroneous readings as these have been normal at home and normal on recheck with appropriate cuff in the upper arm.  - Continue monitoring, ok to continue metoprolol.  AKI on stage IIIa CKD: AKI likely related to renal congestion as CrCl improving with diuresis. Settling at 1.09, recent baseline close to 1.2-1.3.  - Monitor closely, avoid nephrotoxins.   - No current indication for acute consultation, though will need to continue longitudinal follow up with [facility], Dr. FRoyce Macadamia  Weakness:  - PT/OT consults requested. Given the increasing frequency of falls and progressive muscular deconditioning, CIR is recommended and being pursued, meeting with patient at bedside yesterday, pt reports they'll be back today. I believe SNF would alternatively serve the patient better than returning home alone immediately from hospital.   Anxiety:  - Restarted home prn clonazepam BID  False positive HIV serology: Pt reports has had positive result in the past with negative confirmatory testing per PCP. HIV-1 RNA here negative.  - Would not recommend repeat screening this patient in the future as she is low risk for contracting the virus and testing is fraught.  GERD:  - Continue PPI   Subjective: Was hoping her legs would feel and look better by now. No fever, chills, or dyspnea.  Objective: Vitals:   07/23/22 0944 07/23/22 2000 07/24/22 0500 07/24/22 0901  BP: (!) 114/41 (!) 109/42  (!) 113/51  Pulse: (!) 57 60  62  Resp:  16  16  Temp:  97.7 F (  36.5 C)  98.2 F (36.8 C)  TempSrc:  Oral  Oral  SpO2:  98%  92%  Weight:   (!) 160.9 kg   Gen: 66 y.o. female in no distress Pulm: Nonlabored breathing room air. Clear. CV: Regular rate and rhythm. Soft systolic apical murmur is  stable, no new murmur, rub, or gallop. No bruit or JVD, stable dependent edema that has pitting and nonpitting components. GI: Abdomen soft, non-tender, non-distended, with normoactive bowel sounds.  Ext: Warm, no deformities Skin: No new rashes, lesions or ulcers on visualized skin. The lower legs continue to have blister formation and unroofing of some previous blisters, expanding without exudate or fibrin, very superficial. Also stable areas of eschar predominantly left anteriorly/laterally.  Neuro: Alert and oriented. No new focal neurological deficits. Psych: Judgement and insight appear fair. Mood euthymic & affect congruent. Behavior is appropriate.    Data Personally reviewed: WBC 9.6k SCr 2 > 2.6 >> 1.09 K consistently low.  Family Communication: None at bedside  Disposition: Status is: Inpatient due to pursuing safe/optimal discharge plan. PT/OT consulted > pursuing placement at inpatient rehabilitation (remains pending). SNF as second option, ultimately home with [facility] after that.    Patrecia Pour, MD 07/24/2022 12:14 PM Page by [email or web address]

## 2022-07-24 NOTE — Progress Notes (Signed)
Physical Therapy Treatment Patient Details Name: Maria [name] MRN: [id number] DOB: Jan 04, 1956 Today's Date: 07/24/2022   History of Present Illness 66 yo female presents to ED on 7/31 with weakness, weeping LEs, fall on 7/28. Workup for cellulitis and UTI. Admission 11/2021 for HF. PMH - lymphedema, chf, depression, anxiety, ckd, DM, HTN, fatty liver, obesity.    PT Comments    Patient progressing well towards PT goals. Session focused on ambulation progression and functional mobility. Pt reports continued pain in BLEs, right hip and shoulder from fall however agreeable to mobility. Improved ambulation distance with Min A and use of RW for support. Fatigues needing seated rest break during ambulation. Also reports chronic arthritis in right knee. Reports swelling has improved in BLEs but continues to have weeping. Motivated to return to independence. Continues to be a fall risk due to pain, weakness and fatigue. Will follow.    Recommendations for follow up therapy are one component of a multi-disciplinary discharge planning process, led by the attending physician.  Recommendations may be updated based on patient status, additional functional criteria and insurance authorization.  Follow Up Recommendations  Acute inpatient rehab (3hours/day)     Assistance Recommended at Discharge Set up Supervision/Assistance  Patient can return home with the following A little help with walking and/or transfers;A lot of help with bathing/dressing/bathroom   Equipment Recommendations  None recommended by PT    Recommendations for Other Services       Precautions / Restrictions Precautions Precautions: Fall;Other (comment) Precaution Comments: weeping BLEs Restrictions Weight Bearing Restrictions: No     Mobility  Bed Mobility Overal bed mobility: Needs Assistance Bed Mobility: Supine to Sit     Supine to sit: HOB elevated, Supervision     General bed mobility comments: USe of rails but  no physical assist to get to EOB, increased time.    Transfers Overall transfer level: Needs assistance Equipment used: Rolling walker (2 wheels) Transfers: Sit to/from Stand Sit to Stand: Min guard           General transfer comment: Min guard for safety, Stood from [employer], from chair x2, transferred to chair post ambulation. Good demo of hand placement.    Ambulation/Gait Ambulation/Gait assistance: Min assist Gait Distance (Feet): 40 Feet (+ 15') Assistive device: Rolling walker (2 wheels) Gait Pattern/deviations: Step-through pattern, Decreased stride length, Trunk flexed Gait velocity: decr Gait velocity interpretation: <1.31 ft/sec, indicative of household ambulator   General Gait Details: Slow, mildly unsteady gait with bil knee pain/instability, no buckling. Cues for RW management. 1 seated rest break. Limited by weakness/pain.   Stairs             Wheelchair Mobility    Modified Rankin (Stroke Patients Only)       Balance Overall balance assessment: Needs assistance, History of Falls Sitting-balance support: Feet supported, No upper extremity supported Sitting balance-Leahy Scale: Fair     Standing balance support: During functional activity, Reliant on assistive device for balance Standing balance-Leahy Scale: Poor                              Cognition Arousal/Alertness: Awake/alert Behavior During Therapy: WFL for tasks assessed/performed Overall Cognitive Status: Within Functional Limits for tasks assessed  Exercises      General Comments General comments (skin integrity, edema, etc.): Weeping BLEs, reports swelling has improved. Redness present.      Pertinent Vitals/Pain Pain Assessment Pain Assessment: Faces Faces Pain Scale: Hurts even more Pain Location: bilat knees R>L Pain Descriptors / Indicators: Aching, Discomfort, Grimacing Pain Intervention(s):  Monitored during session, Repositioned, RN gave pain meds during session, Limited activity within patient's tolerance    Home Living                          Prior Function            PT Goals (current goals can now be found in the care plan section) Progress towards PT goals: Progressing toward goals    Frequency    Min 3X/week      PT Plan Current plan remains appropriate    Co-evaluation              AM-PAC PT "6 Clicks" Mobility   Outcome Measure  Help needed turning from your back to your side while in a flat bed without using bedrails?: A Little Help needed moving from lying on your back to sitting on the side of a flat bed without using bedrails?: A Little Help needed moving to and from a bed to a chair (including a wheelchair)?: A Little Help needed standing up from a chair using your arms (e.g., wheelchair or bedside chair)?: A Little Help needed to walk in hospital room?: Total Help needed climbing 3-5 steps with a railing? : Total 6 Click Score: 14    End of Session Equipment Utilized During Treatment: Gait belt Activity Tolerance: Patient tolerated treatment well Patient left: in chair;with call bell/phone within reach;with nursing/sitter in room Nurse Communication: Mobility status PT Visit Diagnosis: Muscle weakness (generalized) (M62.81);Other abnormalities of gait and mobility (R26.89)     Time: 9798-9211 PT Time Calculation (min) (ACUTE ONLY): 33 min  Charges:  $Gait Training: 8-22 mins $Therapeutic Activity: 8-22 mins                     Marisa Severin, PT, DPT Acute Rehabilitation Services Secure chat preferred [facility] [place] 07/24/2022, 10:31 AM

## 2022-07-24 NOTE — Progress Notes (Signed)
Progress Note  Patient Name: Maria [name] Date of Encounter: 07/24/2022  Primary Cardiologist:   Minus Breeding, MD   Subjective   Worried about her legs and still having some leg pain.  No SOB.   Inpatient Medications    Scheduled Meds:  clotrimazole   Topical BID   doxycycline  100 mg Oral Q12H   furosemide  40 mg Intravenous Q12H   heparin  5,000 Units Subcutaneous Q8H   insulin aspart  0-15 Units Subcutaneous TID WC   insulin aspart  0-5 Units Subcutaneous QHS   metoprolol tartrate  25 mg Oral BID   pantoprazole  40 mg Oral Daily   potassium chloride  40 mEq Oral QID   pramipexole  0.5 mg Oral TID   Continuous Infusions:   ceFAZolin (ANCEF) IV 1 g (07/24/22 0521)   magnesium sulfate bolus IVPB     PRN Meds: acetaminophen **OR** acetaminophen, clonazePAM, HYDROmorphone (DILAUDID) injection, metoprolol tartrate, ondansetron **OR** ondansetron (ZOFRAN) IV, oxyCODONE, senna-docusate   Vital Signs    Vitals:   07/23/22 0944 07/23/22 2000 07/24/22 0500 07/24/22 0901  BP: (!) 114/41 (!) 109/42  (!) 113/51  Pulse: (!) 57 60  62  Resp:  16  16  Temp:  97.7 F (36.5 C)  98.2 F (36.8 C)  TempSrc:  Oral  Oral  SpO2:  98%  92%  Weight:   (!) 160.9 kg     Intake/Output Summary (Last 24 hours) at 07/24/2022 0915 Last data filed at 07/24/2022 3664 Gross per 24 hour  Intake 540 ml  Output 750 ml  Net -210 ml   Filed Weights   07/22/22 0500 07/23/22 0500 07/24/22 0500  Weight: (!) 158.3 kg (!) 161.1 kg (!) 160.9 kg    Telemetry    NA - Personally Reviewed  ECG    NA - Personally Reviewed  Physical Exam   GEN: No  acute distress.   Neck: No  JVD Cardiac: RRR, soft apical left upper sternal border systolic murmur, no diastolic murmurs, rubs, or gallops.  Respiratory: Clear   to auscultation bilaterally. GI: Soft, nontender, non-distended, normal bowel sounds  MS:  Moderate improved edema; No deformity. Neuro:   Nonfocal  Psych: Oriented and appropriate      Labs    Chemistry Recent Labs  Lab 07/20/22 1429 07/21/22 0550 07/22/22 4034 07/23/22 0546 07/24/22 0704  NA 140   < > 138 137 140  K 5.0   < > 3.0* 2.8* 3.0*  CL 102   < > 100 97* 99  CO2 29   < > '28 29 30  '$ GLUCOSE 123*   < > 109* 103* 131*  BUN 31*   < > '21 19 16  '$ CREATININE 2.60*   < > 1.58* 1.50* 1.09*  CALCIUM 9.1   < > 8.1* 8.1* 8.3*  PROT 7.3  --   --   --   --   ALBUMIN 3.6  --   --   --   --   AST 24  --   --   --   --   ALT 25  --   --   --   --   ALKPHOS 63  --   --   --   --   BILITOT 1.4*  --   --   --   --   GFRNONAA 20*   < > 36* 38* 56*  ANIONGAP 9   < > '10 11 11   '$ < > =  values in this interval not displayed.     Hematology Recent Labs  Lab 07/20/22 1429 07/21/22 0550  WBC 9.6 9.2  RBC 3.95 3.68*  HGB 11.9* 11.1*  HCT 37.0 34.0*  MCV 93.7 92.4  MCH 30.1 30.2  MCHC 32.2 32.6  RDW 14.7 14.6  PLT 257 240    Cardiac EnzymesNo results for input(s): "TROPONINI" in the last 168 hours. No results for input(s): "TROPIPOC" in the last 168 hours.   BNPNo results for input(s): "BNP", "PROBNP" in the last 168 hours.   DDimer No results for input(s): "DDIMER" in the last 168 hours.   Radiology    No results found.  Cardiac Studies   NA  Patient Profile     66 y.o. female with chronic diastolic dysfunction and CKD who presents with leg swelling, pain, cellulitis.    Assessment & Plan    Acute on chronic diastolic dysfunction:     I/O net negative 1 liter.   Weight is unchanged.   Creat continues to fall.  My suggestion again today is to continue IV diuresis as long as she is here as she is so difficult to control at home.    I greatly appreciate all of the effort of TOC Progression team and their excellent note.      Hypokalemia:  Given 120 meq KDur yesterday. Mag supplemented.   Will order 160 meq today  HTN:  BP at target.    CKD IIIB:  Creat continues to fall   For questions or updates, please contact [place] Please consult  [email or web address] for contact info under Cardiology/STEMI.   Signed, Minus Breeding, MD  07/24/2022, 9:15 AM

## 2022-07-25 DIAGNOSIS — I5033 Acute on chronic diastolic (congestive) heart failure: Secondary | ICD-10-CM | POA: Diagnosis not present

## 2022-07-25 DIAGNOSIS — N179 Acute kidney failure, unspecified: Secondary | ICD-10-CM | POA: Diagnosis not present

## 2022-07-25 DIAGNOSIS — L03119 Cellulitis of unspecified part of limb: Secondary | ICD-10-CM | POA: Diagnosis not present

## 2022-07-25 DIAGNOSIS — F411 Generalized anxiety disorder: Secondary | ICD-10-CM | POA: Diagnosis not present

## 2022-07-25 LAB — GLUCOSE, CAPILLARY
Glucose-Capillary: 110 mg/dL — ABNORMAL HIGH (ref 70–99)
Glucose-Capillary: 110 mg/dL — ABNORMAL HIGH (ref 70–99)
Glucose-Capillary: 111 mg/dL — ABNORMAL HIGH (ref 70–99)
Glucose-Capillary: 134 mg/dL — ABNORMAL HIGH (ref 70–99)

## 2022-07-25 LAB — BASIC METABOLIC PANEL
Anion gap: 6 (ref 5–15)
BUN: 18 mg/dL (ref 8–23)
CO2: 30 mmol/L (ref 22–32)
Calcium: 8.4 mg/dL — ABNORMAL LOW (ref 8.9–10.3)
Chloride: 102 mmol/L (ref 98–111)
Creatinine, Ser: 1.41 mg/dL — ABNORMAL HIGH (ref 0.44–1.00)
GFR, Estimated: 41 mL/min — ABNORMAL LOW (ref >60)
Glucose, Bld: 113 mg/dL — ABNORMAL HIGH (ref 70–99)
Potassium: 3.5 mmol/L (ref 3.5–5.1)
Sodium: 138 mmol/L (ref 135–145)

## 2022-07-25 LAB — MAGNESIUM: Magnesium: 1.9 mg/dL (ref 1.7–2.4)

## 2022-07-25 MED ORDER — POTASSIUM CHLORIDE CRYS ER 20 MEQ PO TBCR
40.0000 meq | EXTENDED_RELEASE_TABLET | Freq: Three times a day (TID) | ORAL | Status: AC
  Administered 2022-07-25 – 2022-07-26 (×4): 40 meq via ORAL
  Filled 2022-07-25 (×4): qty 2

## 2022-07-25 NOTE — Progress Notes (Signed)
Orthopedic Tech Progress Note Patient Details:  Maria [name] [birth year] [id number]  Ortho Devices Type of Ortho Device: Haematologist Ortho Device/Splint Location: bi-lateral Ortho Device/Splint Interventions: Ordered, Application, Adjustment  NT on floor held for me. Post Interventions Patient Tolerated: Well Instructions Provided: Care of device, Adjustment of device  [name] 07/25/2022, 8:41 PM

## 2022-07-25 NOTE — Plan of Care (Signed)

## 2022-07-25 NOTE — Care Management (Addendum)
Provided Dr Bonner Puna with number for Peer to Peer for [facility] [phone] opt 5. Case ID number is [id number]. This is due by noon on Monday 8/7  Peer to Peer denied. Patient's called to place appeal. [phone] [facility] auth ID [id number] Confirmation for appeal is [id number] this can take 72 hours for a decision Clinicals faxed to [phone]

## 2022-07-25 NOTE — Progress Notes (Signed)
Progress Note  Patient: Maria [name] QQI:[id number] DOB: 01-19-1956  DOA: 07/20/2022  DOS: 07/25/2022    Brief hospital course: Maria [name] is a 66 y.o. female with a history of lymphedema, morbid obesity, stage IIIa CKD, chronic HFpEF (LVEF 65-70%, G2DD, normal size/function of RV on echo Oct 2022), T2DM, dyslipidemia who presented to the ED with progressive painful swelling in legs despite augmenting oral diuretic, and dysuria. She was diagnosed with cellulitis and UTI, started vancomycin and ceftriaxone, as well as IV lasix, and admitted with cardiology consultation.  Assessment and Plan: Acute on chronic HFpEF:  - Weight at new EDW in 340's lbs. Planning to continue IV diuresis today. Appreciate cardiology insights. Continue strict I/O monitoring and daily weights. - Holding SGLT2i with concurrent UTI.  - Dietary salt and fluid restriction reviewed.   Klebsiella UTI: resistant to amp and macrobid.  - Continue keflex  Cellulitis of bilateral lower legs:  - Continue keflex and doxycycline.  - Continue opioid pain control (for chronic back pain/chronic pain syndrome), oxycodone '10mg'$  q4h prn. Given acute pain component, also continuing dilaudid at this time   Hypokalemia:  - Continue 136mq daily dose, hopeful this provides steady state.    Leg swelling: Multifactorial, prominent component of venous insufficiency, lymphedema is suspected.  - Keep legs elevated, this is still not being done well.  - Unna boots - Diuresis as above  Morbid obesity: Body mass index is 61.98 kg/m.  - Strongly consider bariatric surgery evaluation as an outpatient while she is a surgical candidate.  - With concurrent diabetes, would recommend semaglutide.  T2DM: Diet-controlled. Last HbA1c 6.9% indicating adequate control with no home medications listed.  - SSI ordered, remains at inpatient goal.  Dyslipidemia: Not on medication, note intolerance to rosuvastatin. LDL is 56.  HTN: BP listed as  hypotensive overnight, suspect erroneous readings as these have been normal at home and normal on recheck with appropriate cuff in the upper arm.  - Continue monitoring, ok to continue metoprolol.  AKI on stage IIIa CKD: AKI likely related to renal congestion as CrCl improved and now stabilized with diuresis. Baseline CrCl appears to be just under 610mmin.  - Monitor closely, avoid nephrotoxins.   - No current indication for acute consultation, though will need to continue longitudinal follow up with [facility], Dr. FoRoyce Macadamia Weakness:  - PT/OT consults requested. Given the increasing frequency of falls and progressive muscular deconditioning, CIR is recommended and being pursued, meeting with patient at bedside yesterday, pt reports they'll be back today. I believe SNF would alternatively serve the patient better than returning home alone immediately from hospital.   Anxiety:  - Restarted home prn clonazepam BID  False positive HIV serology: Pt reports has had positive result in the past with negative confirmatory testing per PCP. HIV-1 RNA here negative.  - Would not recommend repeat screening this patient in the future as she is low risk for contracting the virus and testing is fraught.  GERD:  - Continue PPI   Subjective: Having some anxiety off and on improved with clonazepam, excited her legs are less swollen. No fever.  Objective: Vitals:   07/24/22 0901 07/24/22 1624 07/25/22 0500 07/25/22 0739  BP: (!) 113/51 114/69  (!) 131/47  Pulse: 62 76  61  Resp: '16 18  18  '$ Temp: 98.2 F (36.8 C) 97.8 F (36.6 C)  97.8 F (36.6 C)  TempSrc: Oral Oral  Oral  SpO2: 92% 100%  100%  Weight:   (!) 158.7  kg   Gen: 66 y.o. female in no distress Pulm: Nonlabored breathing room air. Clear. CV: Regular rate and rhythm. Stable soft systolic murmur, without rub, or gallop. No JVD, stable pitting and nonpitting dependent edema. GI: Abdomen soft, non-tender, non-distended, with normoactive bowel  sounds.  Ext: Warm, no deformities Skin: Stable violaceous lower legs without induration. Superficial areas of transudate produced denuded skin after blister rupture are stable, swelling overall improved significantly. No new rashes, lesions or ulcers on visualized skin. Neuro: Alert and oriented. No focal neurological deficits. Psych: Judgement and insight appear fair. Mood euthymic & affect congruent. Behavior is appropriate.    Data Personally reviewed: WBC 9.6k SCr 2 > 2.6 >> settling 1.4-1.5 K consistently low.  Family Communication: None at bedside  Disposition: Status is: Inpatient due to pursuing safe/optimal discharge plan. PT/OT consulted > pursuing placement at inpatient rehabilitation (remains pending). SNF as second option, ultimately home with [facility] after that.    Patrecia Pour, MD 07/25/2022 1:34 PM Page by [email or web address]

## 2022-07-25 NOTE — Progress Notes (Signed)
Occupational Therapy Treatment Patient Details Name: Maria [name] MRN: [id number] DOB: 1956/01/15 Today's Date: 07/25/2022   History of present illness 66 yo female presents to ED on 7/31 with weakness, weeping LEs, fall on 7/28. Workup for cellulitis and UTI. Admission 11/2021 for HF. PMH - lymphedema, chf, depression, anxiety, ckd, DM, HTN, fatty liver, obesity.   OT comments  Pt. Seen for skilled OT treatment session.  Focus on safety with rw during ADLS.  Pt. Able to complete standing grooming task with min guard A.  Receptive to education and motivated for participation.  Increase adl completion and activity tolerance next session as able.  Agree with current d/c recommendations for pt. To achieve higher level of adl completion and mobility prior to home.     Recommendations for follow up therapy are one component of a multi-disciplinary discharge planning process, led by the attending physician.  Recommendations may be updated based on patient status, additional functional criteria and insurance authorization.    Follow Up Recommendations  Acute inpatient rehab (3hours/day)    Assistance Recommended at Discharge Frequent or constant Supervision/Assistance  Patient can return home with the following  A little help with walking and/or transfers;A lot of help with bathing/dressing/bathroom;Assistance with cooking/housework;Help with stairs or ramp for entrance   Equipment Recommendations  None recommended by OT    Recommendations for Other Services      Precautions / Restrictions Precautions Precautions: Fall;Other (comment) Precaution Comments: weeping BLEs Restrictions Weight Bearing Restrictions: No       Mobility Bed Mobility Overal bed mobility: Needs Assistance Bed Mobility: Supine to Sit     Supine to sit: HOB elevated, Supervision     General bed mobility comments: USe of rails but no physical assist to get to EOB, increased time.    Transfers Overall  transfer level: Needs assistance Equipment used: Rolling walker (2 wheels) Transfers: Sit to/from Stand Sit to Stand: Min guard                 Balance                                           ADL either performed or assessed with clinical judgement   ADL Overall ADL's : Needs assistance/impaired   Eating/Feeding Details (indicate cue type and reason): states she has to sit eob to eat vs. recliner because she likes her food to be at a certain level and recliner does not allow that ratio Grooming: Min guard;Standing;Oral care;Wash/dry hands Grooming Details (indicate cue type and reason): cues for rw management at counter               Lower Body Dressing Details (indicate cue type and reason): declined review-reports she has A/E at home that she uses-describes sock aide Toilet Transfer: Min guard;Ambulation;Rolling walker (2 wheels) Toilet Transfer Details (indicate cue type and reason): observed/simulated in room mobility ambulation eob to sink and back         Functional mobility during ADLs: Min guard;Rolling walker (2 wheels);Cueing for sequencing;Cueing for safety General ADL Comments: education provided on standing inside rw when at counters vs. pushing to side also reviewed not using anything that moves or light weight items (soap dish/towel rack) to hold onto during any furniture walking    Extremity/Trunk Assessment              Vision  Perception     Praxis      Cognition Arousal/Alertness: Awake/alert Behavior During Therapy: WFL for tasks assessed/performed Overall Cognitive Status: Within Functional Limits for tasks assessed                                          Exercises      Shoulder Instructions       General Comments      Pertinent Vitals/ Pain       Pain Assessment Pain Assessment: Faces Faces Pain Scale: Hurts little more Pain Location: bilat knees R>L Pain Descriptors /  Indicators: Aching, Discomfort, Grimacing Pain Intervention(s): Limited activity within patient's tolerance, Repositioned  Home Living                                          Prior Functioning/Environment              Frequency  Min 2X/week        Progress Toward Goals  OT Goals(current goals can now be found in the care plan section)  Progress towards OT goals: Progressing toward goals     Plan      Co-evaluation                 AM-PAC OT "6 Clicks" Daily Activity     Outcome Measure   Help from another person eating meals?: A Little Help from another person taking care of personal grooming?: A Little Help from another person toileting, which includes using toliet, bedpan, or urinal?: A Lot Help from another person bathing (including washing, rinsing, drying)?: A Lot Help from another person to put on and taking off regular upper body clothing?: A Little Help from another person to put on and taking off regular lower body clothing?: A Lot 6 Click Score: 15    End of Session Equipment Utilized During Treatment: Gait belt;Rolling walker (2 wheels)  OT Visit Diagnosis: Unsteadiness on feet (R26.81);Other abnormalities of gait and mobility (R26.89);Muscle weakness (generalized) (M62.81)   Activity Tolerance Patient tolerated treatment well   Patient Left in bed;with call bell/phone within reach   Nurse Communication          Time: 4580-9983 OT Time Calculation (min): 14 min  Charges: OT General Charges $OT Visit: 1 Visit OT Treatments $Self Care/Home Management : 8-22 mins  Maria [name], Maria [name] Acute Rehabilitation [phone]   Tanya Nones 07/25/2022, 11:23 AM

## 2022-07-25 NOTE — Progress Notes (Signed)
Progress Note  Patient Name: Maria [name] Date of Encounter: 07/25/2022  Primary Cardiologist:   Minus Breeding, MD   Subjective   She has been working with OT and PT.   Still with foot pain.   Inpatient Medications    Scheduled Meds:  cephALEXin  500 mg Oral Q6H   clotrimazole   Topical BID   furosemide  40 mg Intravenous Q12H   heparin  5,000 Units Subcutaneous Q8H   insulin aspart  0-15 Units Subcutaneous TID WC   insulin aspart  0-5 Units Subcutaneous QHS   metoprolol tartrate  25 mg Oral BID   pantoprazole  40 mg Oral Daily   potassium chloride  40 mEq Oral TID   pramipexole  0.5 mg Oral TID   Continuous Infusions:   PRN Meds: acetaminophen **OR** acetaminophen, clonazePAM, HYDROmorphone (DILAUDID) injection, metoprolol tartrate, ondansetron **OR** ondansetron (ZOFRAN) IV, oxyCODONE, senna-docusate   Vital Signs    Vitals:   07/24/22 0901 07/24/22 1624 07/25/22 0500 07/25/22 0739  BP: (!) 113/51 114/69  (!) 131/47  Pulse: 62 76  61  Resp: '16 18  18  '$ Temp: 98.2 F (36.8 C) 97.8 F (36.6 C)  97.8 F (36.6 C)  TempSrc: Oral Oral  Oral  SpO2: 92% 100%  100%  Weight:   (!) 158.7 kg     Intake/Output Summary (Last 24 hours) at 07/25/2022 1300 Last data filed at 07/25/2022 0830 Gross per 24 hour  Intake 1320 ml  Output 600 ml  Net 720 ml   Filed Weights   07/23/22 0500 07/24/22 0500 07/25/22 0500  Weight: (!) 161.1 kg (!) 160.9 kg (!) 158.7 kg    Telemetry    NA - Personally Reviewed  ECG    NA - Personally Reviewed  Physical Exam   GEN: No  acute distress.   Neck: No  JVD Cardiac: RRR, NO murmurs, rubs, or gallops.  Respiratory: Clear   to auscultation bilaterally. GI: Soft, nontender, non-distended, normal bowel sounds  MS:  Mod edema; No deformity. Neuro:   Nonfocal  Psych: Oriented and appropriate    Labs    Chemistry Recent Labs  Lab 07/20/22 1429 07/21/22 0550 07/23/22 0546 07/24/22 0704 07/25/22 0331  NA 140   < > 137 140  138  K 5.0   < > 2.8* 3.0* 3.5  CL 102   < > 97* 99 102  CO2 29   < > '29 30 30  '$ GLUCOSE 123*   < > 103* 131* 113*  BUN 31*   < > '19 16 18  '$ CREATININE 2.60*   < > 1.50* 1.09* 1.41*  CALCIUM 9.1   < > 8.1* 8.3* 8.4*  PROT 7.3  --   --   --   --   ALBUMIN 3.6  --   --   --   --   AST 24  --   --   --   --   ALT 25  --   --   --   --   ALKPHOS 63  --   --   --   --   BILITOT 1.4*  --   --   --   --   GFRNONAA 20*   < > 38* 56* 41*  ANIONGAP 9   < > '11 11 6   '$ < > = values in this interval not displayed.     Hematology Recent Labs  Lab 07/20/22 1429 07/21/22 0550  WBC 9.6 9.2  RBC 3.95 3.68*  HGB 11.9* 11.1*  HCT 37.0 34.0*  MCV 93.7 92.4  MCH 30.1 30.2  MCHC 32.2 32.6  RDW 14.7 14.6  PLT 257 240    Cardiac EnzymesNo results for input(s): "TROPONINI" in the last 168 hours. No results for input(s): "TROPIPOC" in the last 168 hours.   BNPNo results for input(s): "BNP", "PROBNP" in the last 168 hours.   DDimer No results for input(s): "DDIMER" in the last 168 hours.   Radiology    No results found.  Cardiac Studies   NA  Patient Profile     66 y.o. female with chronic diastolic dysfunction and CKD who presents with leg swelling, pain, cellulitis.    Assessment & Plan    Acute on chronic diastolic dysfunction:     I/O incomplete.  349 lbs is the lowest weight I have seen on her in a while.   I would continue IV diuresis again today.   Hypokalemia:  Given 160 meq KDur yesterday.  Potassium is back up.  Would agree that she needs 120 meq daily.     HTN:  BP at target.    CKD IIIB:  Creat up a little.  Yesterday's low was probably spurious.  She is at her baseline.     For questions or updates, please contact [place] Please consult [email or web address] for contact info under Cardiology/STEMI.   Signed, Minus Breeding, MD  07/25/2022, 1:00 PM

## 2022-07-26 DIAGNOSIS — N179 Acute kidney failure, unspecified: Secondary | ICD-10-CM | POA: Diagnosis not present

## 2022-07-26 DIAGNOSIS — F411 Generalized anxiety disorder: Secondary | ICD-10-CM | POA: Diagnosis not present

## 2022-07-26 DIAGNOSIS — I5033 Acute on chronic diastolic (congestive) heart failure: Secondary | ICD-10-CM | POA: Diagnosis not present

## 2022-07-26 DIAGNOSIS — L03119 Cellulitis of unspecified part of limb: Secondary | ICD-10-CM | POA: Diagnosis not present

## 2022-07-26 LAB — BASIC METABOLIC PANEL
Anion gap: 5 (ref 5–15)
BUN: 18 mg/dL (ref 8–23)
CO2: 32 mmol/L (ref 22–32)
Calcium: 8.6 mg/dL — ABNORMAL LOW (ref 8.9–10.3)
Chloride: 102 mmol/L (ref 98–111)
Creatinine, Ser: 1.1 mg/dL — ABNORMAL HIGH (ref 0.44–1.00)
GFR, Estimated: 56 mL/min — ABNORMAL LOW (ref >60)
Glucose, Bld: 124 mg/dL — ABNORMAL HIGH (ref 70–99)
Potassium: 3.9 mmol/L (ref 3.5–5.1)
Sodium: 139 mmol/L (ref 135–145)

## 2022-07-26 LAB — GLUCOSE, CAPILLARY
Glucose-Capillary: 104 mg/dL — ABNORMAL HIGH (ref 70–99)
Glucose-Capillary: 105 mg/dL — ABNORMAL HIGH (ref 70–99)
Glucose-Capillary: 119 mg/dL — ABNORMAL HIGH (ref 70–99)

## 2022-07-26 NOTE — Progress Notes (Signed)
Progress Note  Patient Name: Maria [name] Date of Encounter: 07/26/2022  Primary Cardiologist:   Minus Breeding, MD   Subjective   Legs less painful.  No new SOB.  Awaiting possible in patient rehab.   Inpatient Medications    Scheduled Meds:  cephALEXin  500 mg Oral Q6H   clotrimazole   Topical BID   furosemide  40 mg Intravenous Q12H   heparin  5,000 Units Subcutaneous Q8H   insulin aspart  0-15 Units Subcutaneous TID WC   insulin aspart  0-5 Units Subcutaneous QHS   metoprolol tartrate  25 mg Oral BID   pantoprazole  40 mg Oral Daily   potassium chloride  40 mEq Oral TID   pramipexole  0.5 mg Oral TID   Continuous Infusions:   PRN Meds: acetaminophen **OR** acetaminophen, clonazePAM, HYDROmorphone (DILAUDID) injection, metoprolol tartrate, ondansetron **OR** ondansetron (ZOFRAN) IV, oxyCODONE, senna-docusate   Vital Signs    Vitals:   07/25/22 0739 07/25/22 2000 07/26/22 0500 07/26/22 0746  BP: (!) 131/47 (!) 132/49  (!) 118/45  Pulse: 61 64  (!) 57  Resp: '18 20  17  '$ Temp: 97.8 F (36.6 C) 97.7 F (36.5 C)  98.4 F (36.9 C)  TempSrc: Oral Oral  Oral  SpO2: 100% 100%  97%  Weight:   (!) 161.8 kg     Intake/Output Summary (Last 24 hours) at 07/26/2022 0904 Last data filed at 07/26/2022 0200 Gross per 24 hour  Intake 1050 ml  Output 800 ml  Net 250 ml   Filed Weights   07/24/22 0500 07/25/22 0500 07/26/22 0500  Weight: (!) 160.9 kg (!) 158.7 kg (!) 161.8 kg    Telemetry    NA  - Personally Reviewed  ECG    NA - Personally Reviewed  Physical Exam   GEN: No  acute distress.   Neck: No  JVD Cardiac: RRR, soft apical systolic murmurs, rubs, or gallops.  Respiratory: Clear   to auscultation bilaterally. GI: Soft, nontender, non-distended, normal bowel sounds  MS:  Mild edema; No deformity. Neuro:   Nonfocal  Psych: Oriented and appropriate    Labs    Chemistry Recent Labs  Lab 07/20/22 1429 07/21/22 0550 07/24/22 0704 07/25/22 0331  07/26/22 0557  NA 140   < > 140 138 139  K 5.0   < > 3.0* 3.5 3.9  CL 102   < > 99 102 102  CO2 29   < > 30 30 32  GLUCOSE 123*   < > 131* 113* 124*  BUN 31*   < > '16 18 18  '$ CREATININE 2.60*   < > 1.09* 1.41* 1.10*  CALCIUM 9.1   < > 8.3* 8.4* 8.6*  PROT 7.3  --   --   --   --   ALBUMIN 3.6  --   --   --   --   AST 24  --   --   --   --   ALT 25  --   --   --   --   ALKPHOS 63  --   --   --   --   BILITOT 1.4*  --   --   --   --   GFRNONAA 20*   < > 56* 41* 56*  ANIONGAP 9   < > '11 6 5   '$ < > = values in this interval not displayed.     Hematology Recent Labs  Lab 07/20/22 1429 07/21/22 0550  WBC 9.6 9.2  RBC 3.95 3.68*  HGB 11.9* 11.1*  HCT 37.0 34.0*  MCV 93.7 92.4  MCH 30.1 30.2  MCHC 32.2 32.6  RDW 14.7 14.6  PLT 257 240    Cardiac EnzymesNo results for input(s): "TROPONINI" in the last 168 hours. No results for input(s): "TROPIPOC" in the last 168 hours.   BNPNo results for input(s): "BNP", "PROBNP" in the last 168 hours.   DDimer No results for input(s): "DDIMER" in the last 168 hours.   Radiology    No results found.  Cardiac Studies   NA  Patient Profile     66 y.o. female with chronic diastolic dysfunction and CKD who presents with leg swelling, pain, cellulitis.    Assessment & Plan    Acute on chronic diastolic dysfunction:     I/O incomplete.  OK to change to PO Lasix at any point.  Discharge Torsemide should be 60 mg bid.   She did not know the mg tablet but thought it was 20 and this was correct.   Potassium was 60 meq daily and likely needs to be 80 meq daily.  She takes Zaroxolyn twice weekly which sounds reasonable.    Hypokalemia:  Likely needs 120 meq daily potassium while getting IV Lasix.  HTN:  BP at target.  Continue current meds.      CKD IIIB:  Creat up stable.  Will follow as an out patient.     For questions or updates, please contact [place] Please consult [email or web address] for contact info under Cardiology/STEMI.    Signed, Minus Breeding, MD  07/26/2022, 9:04 AM

## 2022-07-26 NOTE — Progress Notes (Signed)
Progress Note  Patient: Maria [name] CHE:[id number] DOB: 1956-04-26  DOA: 07/20/2022  DOS: 07/26/2022    Brief hospital course: Maria [name] is a 66 y.o. female with a history of lymphedema, morbid obesity, stage IIIa CKD, chronic HFpEF (LVEF 65-70%, G2DD, normal size/function of RV on echo Oct 2022), T2DM, dyslipidemia who presented to the ED with progressive painful swelling in legs despite augmenting oral diuretic, and dysuria. She was diagnosed with cellulitis and UTI, started vancomycin and ceftriaxone, as well as IV lasix, and admitted with cardiology consultation.  Assessment and Plan: Acute on chronic HFpEF:  - Weight at new EDW in 340's lbs. Planning to continue IV diuresis while inpatient. Appreciate cardiology insights, will transition to torsemide and twice weekly metolazone at discharge. - Continue strict I/O monitoring and daily weights. - Holding SGLT2i with concurrent UTI.  - Dietary salt and fluid restriction reviewed.   Klebsiella UTI: resistant to amp and macrobid.  - Continue keflex  Cellulitis of bilateral lower legs:  - Continue keflex and doxycycline.  - Continue opioid pain control (for chronic back pain/chronic pain syndrome), oxycodone '10mg'$  q4h prn. Given acute pain component, also continuing dilaudid at this time   Hypokalemia:  - Continue 139mq daily dose continued. Will continue standing supplement at discharge as well.   Leg swelling: Multifactorial, prominent component of venous insufficiency, lymphedema is suspected.  - Keep legs elevated, this is still not being done well.  - Continue unna boots, placed 8/5. - Diuresis as above  Morbid obesity: Body mass index is 63.19 kg/m.  - Strongly consider bariatric surgery evaluation as an outpatient while she is a surgical candidate.  - With concurrent diabetes, would recommend semaglutide.  T2DM: Diet-controlled. Last HbA1c 6.9% indicating adequate control with no home medications listed.  - SSI  ordered, remains at inpatient goal.  Dyslipidemia: Not on medication, note intolerance to rosuvastatin. LDL is 56.  HTN: BP listed as hypotensive overnight, suspect erroneous readings as these have been normal at home and normal on recheck with appropriate cuff in the upper arm.  - Continue monitoring, ok to continue metoprolol.  AKI on stage IIIa CKD: AKI likely related to renal congestion as CrCl improved and now stabilized with diuresis. Baseline CrCl appears to be very near 612mmin - Monitor closely, avoid nephrotoxins.   - No current indication for acute consultation, though will need to continue longitudinal follow up with [facility], Dr. FoRoyce Macadamia Weakness:  - PT/OT consults requested. Given the increasing frequency of falls and progressive muscular deconditioning, CIR is recommended and being pursued, insurance denied after P2P, appeal in process currently. I believe SNF would alternatively serve the patient better than returning home alone immediately from hospital.   Anxiety:  - Restarted home prn clonazepam BID  False positive HIV serology: Pt reports has had positive result in the past with negative confirmatory testing per PCP. HIV-1 RNA here negative.  - Would not recommend repeat screening this patient in the future as she is low risk for contracting the virus and testing is fraught.  GERD:  - Continue PPI   Subjective: Pain overall improved, getting up to recliner, happy with unna boots and improved swelling. No chest pain or dyspnea reported.   Objective: Vitals:   07/25/22 0739 07/25/22 2000 07/26/22 0500 07/26/22 0746  BP: (!) 131/47 (!) 132/49  (!) 118/45  Pulse: 61 64  (!) 57  Resp: '18 20  17  '$ Temp: 97.8 F (36.6 C) 97.7 F (36.5 C)  98.4 F (36.9  C)  TempSrc: Oral Oral  Oral  SpO2: 100% 100%  97%  Weight:   (!) 161.8 kg   Gen: 66 y.o. female in no distress Pulm: Nonlabored breathing room air. Clear. CV: Regular rate and rhythm. Stable soft systolic murmur, rub,  or gallop. No JVD, trace UE edema. GI: Abdomen soft, non-tender, non-distended, with normoactive bowel sounds.  Ext: Warm, no deformities. Unna boots bilaterally. Toes have brisk cap refill, intact sensorimotor function. Skin: No new rashes, lesions or ulcers on visualized skin. Neuro: Alert and oriented. No focal neurological deficits. Psych: Judgement and insight appear fair. Mood euthymic & affect congruent. Behavior is appropriate.    Data Personally reviewed: WBC 9.6k SCr 2 > 2.6 >> settling 1.4-1.5 K consistently low. Improved with supplement.  Family Communication: None at bedside  Disposition: Status is: Inpatient due to pursuing safe/optimal discharge plan. PT/OT consulted > pursuing placement at inpatient rehabilitation (remains under expedited review). SNF as second option, ultimately home with [facility] after that.    Maria Pour, MD 07/26/2022 12:26 PM Page by [email or web address]

## 2022-07-26 NOTE — Progress Notes (Signed)
Progress Note  Patient Name: Maria [name] Date of Encounter: 07/26/2022  [facility] Cardiologist: Minus Breeding, MD   Subjective   Feeling better. Leg swelling improving. Having intermittent neuropathic pain.  Cr stable 1.06 Net negative 742m Wt 356>352  Inpatient Medications    Scheduled Meds:  cephALEXin  500 mg Oral Q6H   clotrimazole   Topical BID   furosemide  40 mg Intravenous Q12H   heparin  5,000 Units Subcutaneous Q8H   insulin aspart  0-15 Units Subcutaneous TID WC   insulin aspart  0-5 Units Subcutaneous QHS   metoprolol tartrate  25 mg Oral BID   pantoprazole  40 mg Oral Daily   pramipexole  0.5 mg Oral TID   Continuous Infusions:  PRN Meds: acetaminophen **OR** acetaminophen, clonazePAM, HYDROmorphone (DILAUDID) injection, metoprolol tartrate, ondansetron **OR** ondansetron (ZOFRAN) IV, oxyCODONE, senna-docusate   Vital Signs    Vitals:   07/25/22 0739 07/25/22 2000 07/26/22 0500 07/26/22 0746  BP: (!) 131/47 (!) 132/49  (!) 118/45  Pulse: 61 64  (!) 57  Resp: '18 20  17  '$ Temp: 97.8 F (36.6 C) 97.7 F (36.5 C)  98.4 F (36.9 C)  TempSrc: Oral Oral  Oral  SpO2: 100% 100%  97%  Weight:   (!) 161.8 kg     Intake/Output Summary (Last 24 hours) at 07/26/2022 1948 Last data filed at 07/26/2022 1758 Gross per 24 hour  Intake 1660 ml  Output 1150 ml  Net 510 ml      07/26/2022    5:00 AM 07/25/2022    5:00 AM 07/24/2022    5:00 AM  Last 3 Weights  Weight (lbs) 356 lb 11.3 oz 349 lb 13.9 oz 354 lb 11.5 oz  Weight (kg) 161.8 kg 158.7 kg 160.9 kg      Telemetry    Not on telemetry - Personally Reviewed  ECG    No new tracing - Personally Reviewed  Physical Exam   GEN: No acute distress.   Neck: JVD difficult to assess due to body habitus Cardiac: RRR, no murmurs, rubs, or gallops.  Respiratory: Clear to auscultation bilaterally. GI: Soft, nontender, non-distended  MS: LE wrapped, trace edema Neuro:  Nonfocal  Psych: Normal affect    Labs    High Sensitivity Troponin:  No results for input(s): "TROPONINIHS" in the last 720 hours.   Chemistry Recent Labs  Lab 07/20/22 1429 07/21/22 0550 07/22/22 [id number]/03/23 0546 07/24/22 0704 07/25/22 0331 07/26/22 0557  NA 140 138   < > 137 140 138 139  K 5.0 3.7   < > 2.8* 3.0* 3.5 3.9  CL 102 100   < > 97* 99 102 102  CO2 29 28   < > '29 30 30 '$ 32  GLUCOSE 123* 124*   < > 103* 131* 113* 124*  BUN 31* 26*   < > '19 16 18 18  '$ CREATININE 2.60* 1.92*   < > 1.50* 1.09* 1.41* 1.10*  CALCIUM 9.1 8.6*   < > 8.1* 8.3* 8.4* 8.6*  MG  --  1.9  --  1.8  --  1.9  --   PROT 7.3  --   --   --   --   --   --   ALBUMIN 3.6  --   --   --   --   --   --   AST 24  --   --   --   --   --   --  ALT 25  --   --   --   --   --   --   ALKPHOS 63  --   --   --   --   --   --   BILITOT 1.4*  --   --   --   --   --   --   GFRNONAA 20* 29*   < > 38* 56* 41* 56*  ANIONGAP 9 10   < > '11 11 6 5   '$ < > = values in this interval not displayed.    Lipids No results for input(s): "CHOL", "TRIG", "HDL", "LABVLDL", "LDLCALC", "CHOLHDL" in the last 168 hours.  Hematology Recent Labs  Lab 07/20/22 1429 07/21/22 0550  WBC 9.6 9.2  RBC 3.95 3.68*  HGB 11.9* 11.1*  HCT 37.0 34.0*  MCV 93.7 92.4  MCH 30.1 30.2  MCHC 32.2 32.6  RDW 14.7 14.6  PLT 257 240   Thyroid No results for input(s): "TSH", "FREET4" in the last 168 hours.  BNPNo results for input(s): "BNP", "PROBNP" in the last 168 hours.  DDimer No results for input(s): "DDIMER" in the last 168 hours.   Radiology    No results found.  Cardiac Studies   TTE 09/2021: IMPRESSIONS     1. Left ventricular ejection fraction, by estimation, is 65 to 70%. The  left ventricle has normal function. The left ventricle has no regional  wall motion abnormalities. Left ventricular diastolic parameters are  consistent with Grade II diastolic  dysfunction (pseudonormalization). Elevated left ventricular end-diastolic  pressure.   2. Right  ventricular systolic function is normal. The right ventricular  size is normal. Tricuspid regurgitation signal is inadequate for assessing  PA pressure.   3. Left atrial size was upper normal.   4. The mitral valve is grossly normal. No evidence of mitral valve  regurgitation.   5. The aortic valve is tricuspid. Aortic valve regurgitation is not  visualized.   6. The inferior vena cava is dilated in size with >50% respiratory  variability, suggesting right atrial pressure of 8 mmHg.   Comparison(s): Prior images reviewed side by side. LVEF vigorous with  probable increased LVEDP.   Patient Profile     66 y.o. female with chronic diastolic heart failure, morbid obesity, lymphedema, CKD IIIa, DMII, and HLD who presented with worsening LE edema, UTI and cellulitis. Cardiology is now consulted for further management of acute on chronic diastolic heart failure.   Assessment & Plan    #Acute on Chronic HFpEF: Significantly improved. Continuing IV lasix while inpatient with plans to transition to torsemide '60mg'$  BID and metolazone '5mg'$  2x/weekly on discharge. Will add spironolactone today given improved renal function and persistently low K. -Plan to transition to torsemide '60mg'$  BID and metolazone '5mg'$  twice weekly on discharge -Start spironolactone 12.'5mg'$  daily -On 17mq K+ TID; will likely need this on discharge -New dry weight around 350-355lbs  #Hypokalemia: Requiring potassium 430m TID. Likely will need to continue this dosing on discharge with repeat BMET as outpatient for monitoring  #HTN: Controlled. -Continue metop '25mg'$  BID  #UTI: -Management per primary  Cardiology will sign-off. Please feel free to call with questions or concerns. Will arrange for outpatient CV follow-up.  For questions or updates, please contact [facility] consult [email or web address] for contact info under        Signed, HeFreada BergeronMD  07/26/2022, 7:48 PM

## 2022-07-27 ENCOUNTER — Telehealth: Payer: Self-pay | Admitting: Cardiology

## 2022-07-27 ENCOUNTER — Other Ambulatory Visit: Payer: Self-pay | Admitting: Cardiology

## 2022-07-27 DIAGNOSIS — L03119 Cellulitis of unspecified part of limb: Secondary | ICD-10-CM | POA: Diagnosis not present

## 2022-07-27 DIAGNOSIS — I10 Essential (primary) hypertension: Secondary | ICD-10-CM | POA: Diagnosis not present

## 2022-07-27 DIAGNOSIS — F411 Generalized anxiety disorder: Secondary | ICD-10-CM | POA: Diagnosis not present

## 2022-07-27 DIAGNOSIS — N179 Acute kidney failure, unspecified: Secondary | ICD-10-CM | POA: Diagnosis not present

## 2022-07-27 DIAGNOSIS — I5033 Acute on chronic diastolic (congestive) heart failure: Secondary | ICD-10-CM

## 2022-07-27 DIAGNOSIS — E876 Hypokalemia: Secondary | ICD-10-CM

## 2022-07-27 LAB — BASIC METABOLIC PANEL
Anion gap: 9 (ref 5–15)
BUN: 19 mg/dL (ref 8–23)
CO2: 29 mmol/L (ref 22–32)
Calcium: 8.4 mg/dL — ABNORMAL LOW (ref 8.9–10.3)
Chloride: 102 mmol/L (ref 98–111)
Creatinine, Ser: 1.06 mg/dL — ABNORMAL HIGH (ref 0.44–1.00)
GFR, Estimated: 58 mL/min — ABNORMAL LOW (ref >60)
Glucose, Bld: 127 mg/dL — ABNORMAL HIGH (ref 70–99)
Potassium: 3.7 mmol/L (ref 3.5–5.1)
Sodium: 140 mmol/L (ref 135–145)

## 2022-07-27 LAB — GLUCOSE, CAPILLARY
Glucose-Capillary: 126 mg/dL — ABNORMAL HIGH (ref 70–99)
Glucose-Capillary: 110 mg/dL — ABNORMAL HIGH (ref 70–99)
Glucose-Capillary: 93 mg/dL (ref 70–99)
Glucose-Capillary: 158 mg/dL — ABNORMAL HIGH (ref 70–99)

## 2022-07-27 LAB — MAGNESIUM: Magnesium: 1.9 mg/dL (ref 1.7–2.4)

## 2022-07-27 MED ORDER — SPIRONOLACTONE 12.5 MG HALF TABLET
12.5000 mg | ORAL_TABLET | Freq: Every day | ORAL | Status: DC
  Administered 2022-07-27 – 2022-07-29 (×3): 12.5 mg via ORAL
  Filled 2022-07-27 (×3): qty 1

## 2022-07-27 MED ORDER — POTASSIUM CHLORIDE CRYS ER 20 MEQ PO TBCR
40.0000 meq | EXTENDED_RELEASE_TABLET | Freq: Two times a day (BID) | ORAL | Status: DC
  Administered 2022-07-27 – 2022-07-29 (×5): 40 meq via ORAL
  Filled 2022-07-27 (×5): qty 2

## 2022-07-27 NOTE — Progress Notes (Signed)
Physical Therapy Treatment Patient Details Name: Maria [name] MRN: [id number] DOB: 12/21/1956 Today's Date: 07/27/2022   History of Present Illness 66 yo female presents to ED on 7/31 with weakness, weeping LEs, fall on 7/28. Workup for cellulitis and UTI. Admission 11/2021 for HF. PMH - lymphedema, chf, depression, anxiety, ckd, DM, HTN, fatty liver, obesity.    PT Comments    Pt received in supine, agreeable to therapy session with goal of gait and stair training. Pt with good tolerance for stair negotiation needing up to minA for 7" platform step-ups in room x6 reps per home setup. Pt performed short household distance gait trial with RW support after seated break and distance limited due to severe B knee pain (R>L). Pt continues to benefit from PT services to progress toward functional mobility goals.    Recommendations for follow up therapy are one component of a multi-disciplinary discharge planning process, led by the attending physician.  Recommendations may be updated based on patient status, additional functional criteria and insurance authorization.  Follow Up Recommendations  Acute inpatient rehab (3hours/day)     Assistance Recommended at Discharge Set up Supervision/Assistance  Patient can return home with the following A little help with walking and/or transfers;A lot of help with bathing/dressing/bathroom   Equipment Recommendations  None recommended by PT    Recommendations for Other Services       Precautions / Restrictions Precautions Precautions: Fall;Other (comment) Precaution Comments: weeping BLEs Restrictions Weight Bearing Restrictions: No     Mobility  Bed Mobility Overal bed mobility: Needs Assistance Bed Mobility: Supine to Sit, Sit to Supine     Supine to sit: HOB elevated, Min guard Sit to supine: Min assist, HOB elevated   General bed mobility comments: BLE assist needed to bring legs over edge of bed    Transfers Overall transfer level:  Needs assistance Equipment used: Rolling walker (2 wheels) Transfers: Sit to/from Stand Sit to Stand: Min assist           General transfer comment: from EOB<>RW, good recall of safe hand placement from previous sessions; minA lift assist    Ambulation/Gait Ambulation/Gait assistance: Min assist Gait Distance (Feet): 55 Feet Assistive device: Rolling walker (2 wheels) Gait Pattern/deviations: Step-through pattern, Decreased stride length, Trunk flexed       General Gait Details: Slow, mildly unsteady gait with bil knee pain/instability, no buckling. Cues for RW management, proximity to AD, activity pacing.   Stairs Stairs: Yes Stairs assistance: Min assist Stair Management: Two rails, Forwards, Step to pattern Number of Stairs: 6 General stair comments: cues for step sequencing, increased time/effort to perform, heavy reliance on railings; minA steadying at most   Wheelchair Mobility    Modified Rankin (Stroke Patients Only)       Balance Overall balance assessment: Needs assistance, History of Falls Sitting-balance support: Feet supported, No upper extremity supported Sitting balance-Leahy Scale: Fair Sitting balance - Comments: sits on EOB for meals   Standing balance support: During functional activity, Reliant on assistive device for balance Standing balance-Leahy Scale: Poor                              Cognition Arousal/Alertness: Awake/alert Behavior During Therapy: WFL for tasks assessed/performed Overall Cognitive Status: Within Functional Limits for tasks assessed  General Comments: motivated towards improving mobility        Exercises      General Comments General comments (skin integrity, edema, etc.): VSS on RA, SpO2 >90% on RA throughout, DOE 3/4 with exertion. Pt reports wanting to go back on mood stabilizer, RN already aware per discussion.      Pertinent Vitals/Pain Pain  Assessment Pain Assessment: Faces Faces Pain Scale: Hurts little more Pain Location: bilat knees R>L Pain Descriptors / Indicators: Aching, Discomfort, Grimacing Pain Intervention(s): Monitored during session (pt had already notified RN she requests pain meds)           PT Goals (current goals can now be found in the care plan section) Acute Rehab PT Goals Patient Stated Goal: home PT Goal Formulation: With patient Time For Goal Achievement: 08/05/22 Progress towards PT goals: Progressing toward goals    Frequency    Min 3X/week      PT Plan Current plan remains appropriate       AM-PAC PT "6 Clicks" Mobility   Outcome Measure  Help needed turning from your back to your side while in a flat bed without using bedrails?: A Little Help needed moving from lying on your back to sitting on the side of a flat bed without using bedrails?: A Lot (mod cues, HOB elevated) Help needed moving to and from a bed to a chair (including a wheelchair)?: A Lot (mod cues) Help needed standing up from a chair using your arms (e.g., wheelchair or bedside chair)?: A Little Help needed to walk in hospital room?: A Little Help needed climbing 3-5 steps with a railing? : A Lot (mod cues) 6 Click Score: 15    End of Session Equipment Utilized During Treatment: Gait belt Activity Tolerance: Patient tolerated treatment well Patient left: in bed;with call bell/phone within reach;with bed alarm set;Other (comment);with SCD's reapplied (bed in chair posture, heels floated) Nurse Communication: Mobility status;Patient requests pain meds (pt asking about antidepressant) PT Visit Diagnosis: Muscle weakness (generalized) (M62.81);Other abnormalities of gait and mobility (R26.89)     Time: 2423-5361 PT Time Calculation (min) (ACUTE ONLY): 27 min  Charges:  $Gait Training: 8-22 mins $Therapeutic Activity: 8-22 mins                     Uriel Dowding P., PTA Acute Rehabilitation Services Secure Chat  Preferred 9a-5:30pm Office: [place] 07/27/2022, 5:07 PM

## 2022-07-27 NOTE — Plan of Care (Signed)

## 2022-07-27 NOTE — Progress Notes (Signed)
Occupational Therapy Treatment Patient Details Name: Maria [name] MRN: [id number] DOB: 10-23-1956 Today's Date: 07/27/2022   History of present illness 66 yo female presents to ED on 7/31 with weakness, weeping LEs, fall on 7/28. Workup for cellulitis and UTI. Admission 11/2021 for HF. PMH - lymphedema, chf, depression, anxiety, ckd, DM, HTN, fatty liver, obesity.   OT comments  Patient received in supine and agreeable to OT session. Patient performed grooming standing at sink and toilet transfers with min guard assist for safety. Patient asked to perform mobility in hallway and was limited by fatigue but is motivated to increase mobility and independence with self care. Acute OT to continue to follow.    Recommendations for follow up therapy are one component of a multi-disciplinary discharge planning process, led by the attending physician.  Recommendations may be updated based on patient status, additional functional criteria and insurance authorization.    Follow Up Recommendations  Acute inpatient rehab (3hours/day)    Assistance Recommended at Discharge Frequent or constant Supervision/Assistance  Patient can return home with the following  A little help with walking and/or transfers;A lot of help with bathing/dressing/bathroom;Assistance with cooking/housework;Help with stairs or ramp for entrance   Equipment Recommendations  None recommended by OT    Recommendations for Other Services      Precautions / Restrictions Precautions Precautions: Fall;Other (comment) Precaution Comments: weeping BLEs Restrictions Weight Bearing Restrictions: No       Mobility Bed Mobility Overal bed mobility: Needs Assistance Bed Mobility: Supine to Sit     Supine to sit: HOB elevated, Supervision     General bed mobility comments: no physical assistance    Transfers Overall transfer level: Needs assistance Equipment used: Rolling walker (2 wheels) Transfers: Sit to/from Stand Sit  to Stand: Min guard           General transfer comment: min guard for safety     Balance Overall balance assessment: Needs assistance, History of Falls Sitting-balance support: Feet supported, No upper extremity supported Sitting balance-Leahy Scale: Fair Sitting balance - Comments: sits on EOB for meals   Standing balance support: During functional activity, Reliant on assistive device for balance Standing balance-Leahy Scale: Poor Standing balance comment: stood at sink for grooming tasks with one extremity support                           ADL either performed or assessed with clinical judgement   ADL Overall ADL's : Needs assistance/impaired     Grooming: Wash/dry hands;Wash/dry face;Oral care;Brushing hair;Min guard;Standing Grooming Details (indicate cue type and reason): at sink                 Toilet Transfer: Min guard;Ambulation;Rolling walker (2 wheels);Regular Glass blower/designer Details (indicate cue type and reason): ambulated to bathroom and performed on regular toilet Toileting- Clothing Manipulation and Hygiene: Supervision/safety;Sitting/lateral lean         General ADL Comments: min guard for safety. Patient limited by activity tolerance and BLE knee pain    Extremity/Trunk Assessment              Vision       Perception     Praxis      Cognition Arousal/Alertness: Awake/alert Behavior During Therapy: WFL for tasks assessed/performed Overall Cognitive Status: Within Functional Limits for tasks assessed  General Comments: motivated towards improving mobility        Exercises      Shoulder Instructions       General Comments      Pertinent Vitals/ Pain       Pain Assessment Pain Assessment: Faces Faces Pain Scale: Hurts little more Pain Location: bilat knees R>L Pain Descriptors / Indicators: Aching, Discomfort, Grimacing Pain Intervention(s): Limited activity  within patient's tolerance, Monitored during session, Repositioned  Home Living                                          Prior Functioning/Environment              Frequency  Min 2X/week        Progress Toward Goals  OT Goals(current goals can now be found in the care plan section)  Progress towards OT goals: Progressing toward goals  Acute Rehab OT Goals Patient Stated Goal: get better OT Goal Formulation: With patient Time For Goal Achievement: 08/05/22 Potential to Achieve Goals: Good ADL Goals Pt Will Perform Grooming: with modified independence;standing Pt Will Perform Lower Body Bathing: with modified independence;with adaptive equipment;sitting/lateral leans;sit to/from stand Pt Will Perform Lower Body Dressing: with modified independence;sitting/lateral leans;sit to/from stand;with adaptive equipment Pt Will Transfer to Toilet: with modified independence;ambulating Pt Will Perform Toileting - Clothing Manipulation and hygiene: with modified independence;sitting/lateral leans;sit to/from stand  Plan Discharge plan remains appropriate    Co-evaluation                 AM-PAC OT "6 Clicks" Daily Activity     Outcome Measure   Help from another person eating meals?: A Little Help from another person taking care of personal grooming?: A Little Help from another person toileting, which includes using toliet, bedpan, or urinal?: A Lot Help from another person bathing (including washing, rinsing, drying)?: A Lot Help from another person to put on and taking off regular upper body clothing?: A Little Help from another person to put on and taking off regular lower body clothing?: A Lot 6 Click Score: 15    End of Session Equipment Utilized During Treatment: Gait belt;Rolling walker (2 wheels)  OT Visit Diagnosis: Unsteadiness on feet (R26.81);Other abnormalities of gait and mobility (R26.89);Muscle weakness (generalized) (M62.81)   Activity  Tolerance Patient tolerated treatment well   Patient Left in bed;with call bell/phone within reach (seated on EOB with breakfast)   Nurse Communication Mobility status        Time: 7915-0569 OT Time Calculation (min): 36 min  Charges: OT General Charges $OT Visit: 1 Visit OT Treatments $Self Care/Home Management : 23-37 mins  Lodema Hong, [place]  Office [phone]   Trixie Dredge 07/27/2022, 12:43 PM

## 2022-07-27 NOTE — Progress Notes (Signed)
Progress Note  Patient: Maria [name] DOB: Aug 05, 1956  DOA: 07/20/2022  DOS: 07/27/2022    Brief hospital course: Maria [name] is a 66 y.o. female with a history of lymphedema, morbid obesity, stage IIIa CKD, chronic HFpEF (LVEF 65-70%, G2DD, normal size/function of RV on echo Oct 2022), T2DM, dyslipidemia who presented to the ED with progressive painful swelling in legs despite augmenting oral diuretic, and dysuria. She was diagnosed with cellulitis and UTI, started vancomycin and ceftriaxone, as well as IV lasix, and admitted with cardiology consultation.  Assessment and Plan: Acute on chronic HFpEF:  - Weight at new EDW in 340-350's lbs. Planning to continue IV diuresis while inpatient. Appreciate cardiology insights, will transition to torsemide and twice weekly metolazone at discharge. - Continue strict I/O monitoring and daily weights. - Holding SGLT2i with concurrent UTI.  - Starting spironolactone 12.'5mg'$  8/7. - Dietary salt and fluid restriction reviewed.   Klebsiella UTI: resistant to amp and macrobid.  - Considered Tx completed with 7 days abx.  Cellulitis of bilateral lower legs:  - Continue keflex and doxycycline for total 10 days given risk for poor healing.  - Continue opioid pain control (for chronic back pain/chronic pain syndrome), oxycodone '10mg'$  q4h prn. Given acute pain component, also continuing dilaudid at this time   Hypokalemia:  - Continue regular supplementation. Will continue standing supplement at discharge as well.   Leg swelling: Multifactorial, prominent component of venous insufficiency, lymphedema is suspected.  - Keep legs elevated, this is still not being done well.  - Continue unna boots, placed 8/5. - Diuresis as above  Morbid obesity: Body mass index is 62.48 kg/m.  - Strongly consider bariatric surgery evaluation as an outpatient while she is a surgical candidate.  - With concurrent diabetes, would recommend  semaglutide.  T2DM: Diet-controlled. Last HbA1c 6.9% indicating adequate control with no home medications listed.  - SSI ordered, remains at inpatient goal.  Dyslipidemia: Not on medication, note intolerance to rosuvastatin. LDL is 56.  HTN: BP listed as hypotensive overnight, suspect erroneous readings as these have been normal at home and normal on recheck with appropriate cuff in the upper arm.  - Continue monitoring, ok to continue metoprolol. Monitor with addition of low dose spironolactone  AKI on stage IIIa CKD: AKI likely related to renal congestion as CrCl improved and now stabilized with diuresis. Baseline CrCl appears to be very near 67m/min - Monitor closely, avoid nephrotoxins.   - No current indication for acute consultation, though will need to continue longitudinal follow up with [facility], Dr. FRoyce Macadamia  Weakness:  - PT/OT consults requested. Given the increasing frequency of falls and progressive muscular deconditioning, CIR is recommended and being pursued, insurance denied after P2P, appeal in process currently. I believe SNF would alternatively serve the patient better than returning home alone immediately from hospital.   Anxiety:  - Restarted home prn clonazepam BID  False positive HIV serology: Pt reports has had positive result in the past with negative confirmatory testing per PCP. HIV-1 RNA here negative.  - Would not recommend repeat screening this patient in the future as she is low risk for contracting the virus and testing is fraught.  GERD:  - Continue PPI   Subjective: No new issues. Feeling generally better, working eagerly with therapy. Pain controlled.  Objective: Vitals:   07/26/22 0746 07/26/22 2037 07/27/22 0213 07/27/22 0757  BP: (!) 118/45 (!) 133/43  (!) 135/46  Pulse: (!) 57 60  (!) 57  Resp: 17 19  16  Temp: 98.4 F (36.9 C) 98.3 F (36.8 C)  97.6 F (36.4 C)  TempSrc: Oral Oral  Oral  SpO2: 97% 98%  96%  Weight:   (!) 160 kg   Gen: 66  y.o. female in no distress Pulm: Nonlabored breathing room air. Clear. CV: Regular rate and rhythm. Stable soft early systolic apical murmur without rub, or gallop. UTD JVD, unna boots, though toes w/trace edema. GI: Abdomen soft, non-tender, non-distended, with normoactive bowel sounds.  Ext: Warm, no deformities Skin: No rashes, lesions or ulcers on visualized skin. Neuro: Alert and oriented. No focal neurological deficits. Psych: Judgement and insight appear fair. Mood euthymic & affect congruent. Behavior is appropriate.    Data Personally reviewed: WBC 9.6k SCr 2 > 2.6 >> settling 1.0-1.1 K consistently low. Improved with supplement.  Family Communication: None at bedside  Disposition: Status is: Inpatient due to pursuing safe/optimal discharge plan. Awaiting CIR insurance appeal, can be up to 72 hours (til tomorrow PM). If not approved, would seek SNF.    Patrecia Pour, MD 07/27/2022 9:31 AM Page by [email or web address]

## 2022-07-27 NOTE — Progress Notes (Signed)
CSW attempted to reach Lattie Haw at [facility] without success.  Madilyn Fireman, MSW, LCSW Transitions of Care  Clinical Social Worker II [phone]

## 2022-07-28 DIAGNOSIS — I5033 Acute on chronic diastolic (congestive) heart failure: Secondary | ICD-10-CM | POA: Diagnosis not present

## 2022-07-28 DIAGNOSIS — E876 Hypokalemia: Secondary | ICD-10-CM | POA: Diagnosis not present

## 2022-07-28 DIAGNOSIS — I10 Essential (primary) hypertension: Secondary | ICD-10-CM | POA: Diagnosis not present

## 2022-07-28 DIAGNOSIS — N179 Acute kidney failure, unspecified: Secondary | ICD-10-CM | POA: Diagnosis not present

## 2022-07-28 LAB — GLUCOSE, CAPILLARY
Glucose-Capillary: 104 mg/dL — ABNORMAL HIGH (ref 70–99)
Glucose-Capillary: 101 mg/dL — ABNORMAL HIGH (ref 70–99)
Glucose-Capillary: 118 mg/dL — ABNORMAL HIGH (ref 70–99)
Glucose-Capillary: 153 mg/dL — ABNORMAL HIGH (ref 70–99)

## 2022-07-28 MED ORDER — DOXYCYCLINE HYCLATE 100 MG PO TABS
100.0000 mg | ORAL_TABLET | Freq: Two times a day (BID) | ORAL | Status: DC
  Administered 2022-07-28 – 2022-07-29 (×3): 100 mg via ORAL
  Filled 2022-07-28 (×3): qty 1

## 2022-07-28 MED ORDER — CEPHALEXIN 500 MG PO CAPS
500.0000 mg | ORAL_CAPSULE | Freq: Four times a day (QID) | ORAL | Status: DC
  Administered 2022-07-28 – 2022-07-29 (×6): 500 mg via ORAL
  Filled 2022-07-28 (×6): qty 1

## 2022-07-28 MED ORDER — ALUM & MAG HYDROXIDE-SIMETH 200-200-20 MG/5ML PO SUSP
15.0000 mL | ORAL | Status: DC | PRN
  Administered 2022-07-28 – 2022-07-29 (×2): 15 mL via ORAL
  Filled 2022-07-28 (×2): qty 30

## 2022-07-28 NOTE — Progress Notes (Signed)
Progress Note  Patient: Maria [name] KZS:[id number] DOB: [birth year]  DOA: 07/20/2022  DOS: 07/28/2022    Brief hospital course: Maria [name] is a 66 y.o. female with a history of lymphedema, morbid obesity, stage IIIa CKD, chronic HFpEF (LVEF 65-70%, G2DD, normal size/function of RV on echo Oct 2022), T2DM, dyslipidemia who presented to the ED with progressive painful swelling in legs despite augmenting oral diuretic, and dysuria. She was diagnosed with cellulitis and UTI, started vancomycin and ceftriaxone, as well as IV lasix, and admitted with cardiology consultation.  Assessment and Plan: Acute on chronic HFpEF:  - Weight at new EDW in 340-350's lbs. 156.9kg on 8/8. Planning to continue IV diuresis while inpatient. Appreciate cardiology insights, will transition to torsemide and twice weekly metolazone at discharge. - Continue strict I/O monitoring and daily weights. - Holding SGLT2i with concurrent UTI, though will be able to restart at discharge. - Started spironolactone 12.'5mg'$  8/7. - Dietary salt and fluid restriction reviewed.   Klebsiella UTI: resistant to amp and macrobid.  - Considered Tx completed with 7 days abx.  Cellulitis of bilateral lower legs:  - Continue keflex and doxycycline for total 10 days given risk for poor healing. Meds fell off orders, reordered today. - Continue opioid pain control (for chronic back pain/chronic pain syndrome), oxycodone '10mg'$  q4h prn. Given acute pain component, also continuing dilaudid at this time.  Hypokalemia:  - Continue regular supplementation. Will continue standing supplement at discharge as well.  - Recheck in AM if still here.  Leg swelling: Multifactorial, prominent component of venous insufficiency, lymphedema is suspected.  - Keep legs elevated, this is still not being done well.  - Continue unna boots, placed 8/5. - Diuresis as above  Morbid obesity: Body mass index is 61.27 kg/m.  - Strongly consider bariatric surgery  evaluation as an outpatient while she is a surgical candidate.  - With concurrent diabetes, would recommend semaglutide.  T2DM: Diet-controlled. Last HbA1c 6.9% indicating adequate control with no home medications listed.  - SSI ordered, remains at inpatient goal.  Dyslipidemia: Not on medication, note intolerance to rosuvastatin. LDL is 56.  HTN: BP listed as hypotensive overnight, suspect erroneous readings as these have been normal at home and normal on recheck with appropriate cuff in the upper arm.  - Continue monitoring, ok to continue metoprolol. Monitor with addition of low dose spironolactone  AKI on stage IIIa CKD: AKI likely related to renal congestion as CrCl improved and now stabilized with diuresis. Baseline CrCl appears to be very near 47m/min - Monitor closely, avoid nephrotoxins.   - No current indication for acute consultation, though will need to continue longitudinal follow up with [facility], Dr. FRoyce Macadamia  Weakness: Given the increasing frequency of falls and progressive muscular deconditioning, CIR is recommended and being pursued, insurance denied after P2P, appeal in process currently. I believe SNF would alternatively serve the patient better than returning home alone immediately from hospital.   Anxiety:  - Restarted home prn clonazepam BID  False positive HIV serology: Pt reports has had positive result in the past with negative confirmatory testing per PCP. HIV-1 RNA here negative.  - Would not recommend repeat screening this patient in the future as she is low risk for contracting the virus and testing is fraught.  GERD:  - Continue PPI   Subjective: Sleeping in this morning, had no issues yesterday and no shortness of breath currently. Pain controlled.   Objective: Vitals:   07/27/22 0732208/07/23 1959 07/28/22 0500 07/28/22 00254  BP: (!) 127/45 (!) 149/58  131/63  Pulse: 62 70  64  Resp:  18  16  Temp:  97.8 F (36.6 C)  98.4 F (36.9 C)  TempSrc:  Oral   Oral  SpO2:  99%  100%  Weight:   (!) 156.9 kg   Gen: 66 y.o. female in no distress Pulm: Nonlabored breathing room air. Clear. CV: Regular rate and rhythm. No new murmur, rub, or gallop. Stable dependent edema within constraints of unna boot. GI: Abdomen soft, non-tender, non-distended, with normoactive bowel sounds.  Ext: Warm, no deformities Skin: No rashes, lesions or ulcers on visualized skin. (Unna boot covering lower legs) Neuro: Alert and oriented. No focal neurological deficits. Psych: Judgement and insight appear fair. Mood euthymic & affect congruent. Behavior is appropriate.    Data Personally reviewed: WBC 9.6k SCr 2 > 2.6 >> settling 1.0-1.1 K consistently low. Improved with supplement.  Family Communication: None at bedside  Disposition: Status is: Inpatient due to pursuing safe/optimal discharge plan. Awaiting CIR insurance appeal. If not approved, would seek SNF.    Patrecia Pour, MD 07/28/2022 12:21 PM Page by [email or web address]

## 2022-07-28 NOTE — TOC Progression Note (Addendum)
Transition of Care [facility]) - Progression Note    Patient Details  Name: Maria [name] MRN: [id number] Date of Birth: 26-Feb-1956  Transition [facility]) CM/SW [place], RN Phone Number: 07/28/2022, 9:20 AM  Clinical Narrative:    CM called and spoke with Anderson Malta, [place] at [facility] and appeal to [facility] is pending at this time.  [facility] CM states that they will follow up with CM concerning determination.  I called and spoke with [facility] as a backup plan to AIR and they are accepting patient for home health services if [facility] Medicare does not approve for Inpatient rehabilitation services.  CM will continue to follow the patient for AIR versus Home with [facility] - Pending appeal with [facility] for placement at [facility].  07/28/2022 1500 - Anderson Malta, [place] with [facility] states that the patient's appeal went to an independent appeal provider - [employer] and that appeal process may be lengthy and patient unable to remain inpatient while waiting on approval.  The patient states that her mobility has improved and that she is comfortable going home with home health services.  [facility] will be providing home health services - orders will be placed na attending physician aware and will plan for discharge tomorrow and transport to home by ambulance transport.  Medications to be provided through [place].   Expected Discharge Plan: IP Rehab Facility Barriers to Discharge: Continued Medical Work up  Expected Discharge Plan and Services Expected Discharge Plan: [place]   Discharge Planning Services: CM Consult Post Acute Care Choice: IP Rehab Living arrangements for the past 2 months: Apartment                                       Social Determinants of Health (SDOH) Interventions    Readmission Risk Interventions    07/22/2022    2:59 PM 04/11/2021   11:40 AM   Readmission Risk Prevention Plan  Transportation Screening Complete Complete  PCP or Specialist Appt within 3-5 Days Complete Complete  HRI or [place] Complete Complete  Social Work Consult for [place] Planning/Counseling Complete Complete  Palliative Care Screening Complete Not Applicable  Medication Review Press photographer) Complete Complete

## 2022-07-29 ENCOUNTER — Inpatient Hospital Stay (HOSPITAL_COMMUNITY): Payer: Medicare Other

## 2022-07-29 ENCOUNTER — Other Ambulatory Visit (HOSPITAL_COMMUNITY): Payer: Self-pay

## 2022-07-29 DIAGNOSIS — M79605 Pain in left leg: Secondary | ICD-10-CM

## 2022-07-29 DIAGNOSIS — M79604 Pain in right leg: Secondary | ICD-10-CM

## 2022-07-29 DIAGNOSIS — F411 Generalized anxiety disorder: Secondary | ICD-10-CM | POA: Diagnosis not present

## 2022-07-29 DIAGNOSIS — I5033 Acute on chronic diastolic (congestive) heart failure: Secondary | ICD-10-CM | POA: Diagnosis not present

## 2022-07-29 DIAGNOSIS — N179 Acute kidney failure, unspecified: Secondary | ICD-10-CM | POA: Diagnosis not present

## 2022-07-29 DIAGNOSIS — L03119 Cellulitis of unspecified part of limb: Secondary | ICD-10-CM | POA: Diagnosis not present

## 2022-07-29 LAB — BASIC METABOLIC PANEL
Anion gap: 7 (ref 5–15)
BUN: 21 mg/dL (ref 8–23)
CO2: 31 mmol/L (ref 22–32)
Calcium: 8.7 mg/dL — ABNORMAL LOW (ref 8.9–10.3)
Chloride: 101 mmol/L (ref 98–111)
Creatinine, Ser: 1.25 mg/dL — ABNORMAL HIGH (ref 0.44–1.00)
GFR, Estimated: 48 mL/min — ABNORMAL LOW (ref >60)
Glucose, Bld: 114 mg/dL — ABNORMAL HIGH (ref 70–99)
Potassium: 3.8 mmol/L (ref 3.5–5.1)
Sodium: 139 mmol/L (ref 135–145)

## 2022-07-29 LAB — GLUCOSE, CAPILLARY
Glucose-Capillary: 87 mg/dL (ref 70–99)
Glucose-Capillary: 109 mg/dL — ABNORMAL HIGH (ref 70–99)
Glucose-Capillary: 139 mg/dL — ABNORMAL HIGH (ref 70–99)
Glucose-Capillary: 111 mg/dL — ABNORMAL HIGH (ref 70–99)

## 2022-07-29 MED ORDER — SPIRONOLACTONE 25 MG PO TABS
12.5000 mg | ORAL_TABLET | Freq: Every day | ORAL | 0 refills | Status: DC
  Filled 2022-07-29: qty 15, 30d supply, fill #0

## 2022-07-29 MED ORDER — TORSEMIDE 20 MG PO TABS
60.0000 mg | ORAL_TABLET | Freq: Two times a day (BID) | ORAL | 0 refills | Status: DC
  Filled 2022-07-29: qty 180, 30d supply, fill #0

## 2022-07-29 MED ORDER — POTASSIUM CHLORIDE CRYS ER 20 MEQ PO TBCR
80.0000 meq | EXTENDED_RELEASE_TABLET | Freq: Every day | ORAL | 0 refills | Status: DC
  Filled 2022-07-29: qty 120, 30d supply, fill #0

## 2022-07-29 NOTE — Plan of Care (Signed)

## 2022-07-29 NOTE — TOC Transition Note (Addendum)
Transition of Care [facility]) - CM/SW Discharge Note   Patient Details  Name: Maria [name] MRN: [id number] Date of Birth: 1956-01-28  [facility]) CM/SW Contact:  Curlene Labrum, RN Phone Number: 07/29/2022, 10:48 AM   Clinical Narrative:    CM met with the patient at the bedside to discuss transitions of care to home today.  The patient lives alone but is able to ambulate with RW with improved mobility since admission to the hospital.  The patient requests to be transported home by ambulance once TOC medications have been delivered to the patient's hospital room.  The patient has a house key to enter the home.  Unna boots were changed by the orthopedic tech this morning and [place] will be providing home health services at the home includine RN, PT, aide and will reach out to the patient by phone to coordinate visits.  The patient states that she is unable to afford a used lift chair at this time but plans on buying an ottoman and use pillow to raise her feet up during the day.  The patient has all DME needed at the home.  Groceries are delivered to the home by [employer].  The patient's family has access to the patient's home in case of emergency to assist the patient with needs.  [facility] will be providing PCP services at the home through home visits.  I spoke with Ernestina [place], MSW with [facility] and she is aware that the patient is being discharged home with home health services since the patient's Health Insurance provider was unable to approve for AIR rehabilitation admission.  ABi was requested by the home health company and was ordered by Dr. Bonner Puna.  Test was completed with pending results to be read by the radiologist.  Dr. Bonner Puna aware and patient can be discharged home as planned after 3 pm today by PTAR.  Unna boots were reapplied by orthopedic tech per orders.  PTAR will be arranged once discharge summary is completed and  patient's TOC medications have been delivered to the room.  [facility] was called and transportation was arranged - [facility] should arrive around 1 pm to transport the patient home.  Discharge packet located at the secretary's desk.   Final next level of care: [place] Barriers to Discharge: No Barriers Identified   Patient Goals and CMS Choice Patient states their goals for this hospitalization and ongoing recovery are:: To return home with home health. CMS Medicare.gov Compare Post Acute Care list provided to:: Patient Choice offered to / list presented to : Patient  Discharge Placement                       Discharge Plan and Services   Discharge Planning Services: CM Consult Post Acute Care Choice: Home Health                    HH Arranged: RN, PT, Nurse's Aide [facility] Agency: [place] Date [place]: 07/28/22 Time HH Agency Contacted: 1400 Representative spoke with at [place]: Claiborne Billings, [place] with [place]  Social Determinants of Health (SDOH) Interventions     Readmission Risk Interventions    07/22/2022    2:59 PM 04/11/2021   11:40 AM  Readmission Risk Prevention Plan  Transportation Screening Complete Complete  PCP or Specialist Appt within 3-5 Days Complete Complete  HRI or Home Care Consult Complete Complete  Social  Work Consult for [place] Planning/Counseling Complete Complete  Palliative Care Screening Complete Not Applicable  Medication Review Press photographer) Complete Complete

## 2022-07-29 NOTE — Progress Notes (Signed)
Orthopedic Tech Progress Note Patient Details:  Maria [name] [birth year] [id number]  Ortho Devices Type of Ortho Device: Haematologist, Ace wrap Ortho Device/Splint Location: BLE Ortho Device/Splint Interventions: Ordered, Application, Adjustment   Post Interventions Patient Tolerated: Well Instructions Provided: Care of [place] 07/29/2022, 11:37 AM

## 2022-07-29 NOTE — Progress Notes (Addendum)
Physical Therapy Treatment Patient Details Name: Maria [name] MRN: [id number] DOB: 03-14-1956 Today's Date: 07/29/2022   History of Present Illness 66 yo female presents to ED on 7/31 with weakness, weeping LEs, fall on 7/28. Workup for cellulitis and UTI. Admission 11/2021 for HF. PMH - lymphedema, chf, depression, anxiety, ckd, DM, HTN, fatty liver, obesity.    PT Comments    Pt received in bathroom, agreeable to therapy session for assist to progress gait and transfers and review safe use of 4WW vs RW. Pt reports she needs a bariatric RW as the one she has at home is standard/no wheels and not sufficient to her needs. Pt making good progress toward goals, reviewed gait safety and activity pacing/energy conservation with handout given to reinforce EC. Pt continues to benefit from PT services to progress toward functional mobility goals.  DME recs updated below per discussion with pt/supervising PT.  Recommendations for follow up therapy are one component of a multi-disciplinary discharge planning process, led by the attending physician.  Recommendations may be updated based on patient status, additional functional criteria and insurance authorization.  Follow Up Recommendations  Acute inpatient rehab (3hours/day)     Assistance Recommended at Discharge Set up Supervision/Assistance  Patient can return home with the following A little help with walking and/or transfers;A lot of help with bathing/dressing/bathroom   Equipment Recommendations  Other (comment);Rolling walker (2 wheels) (bariatric RW (current walker is standard walker and not appropriate to her needs))    Recommendations for Other Services       Precautions / Restrictions Precautions Precautions: Fall;Other (comment) Precaution Comments: weeping BLEs Restrictions Weight Bearing Restrictions: No     Mobility  Bed Mobility Overal bed mobility: Needs Assistance Bed Mobility: Sit to Supine       Sit to supine: Min  assist   General bed mobility comments: cues for improved technique, pt needs BLE assist to clear edge of bed    Transfers Overall transfer level: Needs assistance Equipment used: Rollator (4 wheels) Transfers: Sit to/from Stand Sit to Stand: Modified independent (Device/Increase time)           General transfer comment: modI from toilet using rail and to EOB/rollator, good use of brakes prior to sitting    Ambulation/Gait Ambulation/Gait assistance: Supervision Gait Distance (Feet): 50 Feet (x4 trials) Assistive device: Rollator (4 wheels) Gait Pattern/deviations: Step-through pattern, Decreased stride length, Trunk flexed, Decreased step length - left, Antalgic Gait velocity: decr Gait velocity interpretation: <1.31 ft/sec, indicative of household ambulator   General Gait Details: cues for closer proximity to 4WW and activity pacing, x3 seated rest breaks due to B knee pain   Stairs             Wheelchair Mobility    Modified Rankin (Stroke Patients Only)       Balance Overall balance assessment: Needs assistance, History of Falls Sitting-balance support: Feet supported, No upper extremity supported Sitting balance-Leahy Scale: Fair Sitting balance - Comments: sits on EOB for meals   Standing balance support: During functional activity, Reliant on assistive device for balance Standing balance-Leahy Scale: Poor Standing balance comment: needs BUE support for dynamic tasks due to B knee pain                            Cognition Arousal/Alertness: Awake/alert Behavior During Therapy: WFL for tasks assessed/performed Overall Cognitive Status: Within Functional Limits for tasks assessed  General Comments: eager to return home        Exercises      General Comments General comments (skin integrity, edema, etc.): SpO2 and HR WFL on RA when checked during mobility      Pertinent Vitals/Pain Pain  Assessment Pain Assessment: Faces Faces Pain Scale: Hurts little more Pain Location: bilat knees R>L Pain Descriptors / Indicators: Aching, Discomfort, Grimacing Pain Intervention(s): Monitored during session, Limited activity within patient's tolerance, Repositioned           PT Goals (current goals can now be found in the care plan section) Acute Rehab PT Goals PT Goal Formulation: With patient Time For Goal Achievement: 08/05/22 Progress towards PT goals: Progressing toward goals    Frequency    Min 3X/week      PT Plan Equipment recommendations need to be updated       AM-PAC PT "6 Clicks" Mobility   Outcome Measure  Help needed turning from your back to your side while in a flat bed without using bedrails?: A Little Help needed moving from lying on your back to sitting on the side of a flat bed without using bedrails?: A Little Help needed moving to and from a bed to a chair (including a wheelchair)?: A Little Help needed standing up from a chair using your arms (e.g., wheelchair or bedside chair)?: A Little Help needed to walk in hospital room?: A Little Help needed climbing 3-5 steps with a railing? : A Lot 6 Click Score: 17    End of Session Equipment Utilized During Treatment: Gait belt Activity Tolerance: Patient tolerated treatment well Patient left: in bed;with call bell/phone within reach;with bed alarm set;Other (comment) (BLE elevated on pillow) Nurse Communication: Mobility status PT Visit Diagnosis: Muscle weakness (generalized) (M62.81);Other abnormalities of gait and mobility (R26.89)     Time: 0981-1914 PT Time Calculation (min) (ACUTE ONLY): 20 min  Charges:  $Gait Training: 8-22 mins                     Maria [name] P., PTA Acute Rehabilitation Services Secure Chat Preferred 9a-5:30pm Office: [place] 07/29/2022, 1:30 PM

## 2022-07-29 NOTE — Progress Notes (Signed)
Occupational Therapy Treatment Patient Details Name: Maria [name] MRN: [id number] DOB: 03/12/1956 Today's Date: 07/29/2022   History of present illness 66 yo female presents to ED on 7/31 with weakness, weeping LEs, fall on 7/28. Workup for cellulitis and UTI. Admission 11/2021 for HF. PMH - lymphedema, chf, depression, anxiety, ckd, DM, HTN, fatty liver, obesity.   OT comments  Patient seated on EOB and eager to participate. Patient was able to perform grooming, toilet transfers, and toilet hygiene with supervision and one seated rest break during grooming at sink. Patient performed mobility in hallway with RW and supervision for safety. Patient is expected to return home today and would benefit from further OT therapy with [place].   Recommendations for follow up therapy are one component of a multi-disciplinary discharge planning process, led by the attending physician.  Recommendations may be updated based on patient status, additional functional criteria and insurance authorization.    Follow Up Recommendations  Home health OT    Assistance Recommended at Discharge Frequent or constant Supervision/Assistance  Patient can return home with the following  A little help with walking and/or transfers;Assistance with cooking/housework;Help with stairs or ramp for entrance;A little help with bathing/dressing/bathroom   Equipment Recommendations  None recommended by OT    Recommendations for Other Services      Precautions / Restrictions Precautions Precautions: Fall;Other (comment) Precaution Comments: weeping BLEs Restrictions Weight Bearing Restrictions: No       Mobility Bed Mobility Overal bed mobility: Needs Assistance             General bed mobility comments: seated on EOB upon entry    Transfers Overall transfer level: Needs assistance Equipment used: Rolling walker (2 wheels) Transfers: Sit to/from Stand Sit to Stand: Supervision           General transfer  comment: supervision to stand from EOB, chair, and toilet.     Balance Overall balance assessment: Needs assistance, History of Falls Sitting-balance support: Feet supported, No upper extremity supported Sitting balance-Leahy Scale: Fair Sitting balance - Comments: sits on EOB for meals   Standing balance support: During functional activity, Reliant on assistive device for balance Standing balance-Leahy Scale: Poor Standing balance comment: stood at sink for grooming without UE support                           ADL either performed or assessed with clinical judgement   ADL Overall ADL's : Needs assistance/impaired     Grooming: Wash/dry hands;Wash/dry face;Oral care;Supervision/safety;Standing Grooming Details (indicate cue type and reason): seated rest break following due to BLE knee pain                 Toilet Transfer: Supervision/safety;Ambulation;Regular Toilet;Rolling walker (2 wheels) Toilet Transfer Details (indicate cue type and reason): ambulated to bathroom and performed on regular toilet Toileting- Clothing Manipulation and Hygiene: Supervision/safety;Sitting/lateral lean         General ADL Comments: supervision for safety with self care    Extremity/Trunk Assessment              Vision       Perception     Praxis      Cognition Arousal/Alertness: Awake/alert Behavior During Therapy: WFL for tasks assessed/performed Overall Cognitive Status: Within Functional Limits for tasks assessed  General Comments: eager to return home        Exercises      Shoulder Instructions       General Comments      Pertinent Vitals/ Pain       Pain Assessment Pain Assessment: Faces Faces Pain Scale: Hurts little more Pain Location: bilat knees R>L Pain Descriptors / Indicators: Aching, Discomfort, Grimacing Pain Intervention(s): Limited activity within patient's tolerance, Monitored during  session, Repositioned  Home Living                                          Prior Functioning/Environment              Frequency  Min 2X/week        Progress Toward Goals  OT Goals(current goals can now be found in the care plan section)  Progress towards OT goals: Progressing toward goals  Acute Rehab OT Goals Patient Stated Goal: go home OT Goal Formulation: With patient Time For Goal Achievement: 08/05/22 Potential to Achieve Goals: Good ADL Goals Pt Will Perform Grooming: with modified independence;standing Pt Will Perform Lower Body Bathing: with modified independence;with adaptive equipment;sitting/lateral leans;sit to/from stand Pt Will Perform Lower Body Dressing: with modified independence;sitting/lateral leans;sit to/from stand;with adaptive equipment Pt Will Transfer to Toilet: with modified independence;ambulating Pt Will Perform Toileting - Clothing Manipulation and hygiene: with modified independence;sitting/lateral leans;sit to/from stand  Plan Discharge plan remains appropriate    Co-evaluation                 AM-PAC OT "6 Clicks" Daily Activity     Outcome Measure   Help from another person eating meals?: A Little Help from another person taking care of personal grooming?: A Little Help from another person toileting, which includes using toliet, bedpan, or urinal?: A Little Help from another person bathing (including washing, rinsing, drying)?: A Little Help from another person to put on and taking off regular upper body clothing?: A Little Help from another person to put on and taking off regular lower body clothing?: A Little 6 Click Score: 18    End of Session Equipment Utilized During Treatment: Rolling walker (2 wheels)  OT Visit Diagnosis: Unsteadiness on feet (R26.81);Other abnormalities of gait and mobility (R26.89);Muscle weakness (generalized) (M62.81)   Activity Tolerance Patient tolerated treatment well    Patient Left in bed;with call bell/phone within reach;Other (comment) (seated on EOB)   Nurse Communication Mobility status        Time: 2683-4196 OT Time Calculation (min): 24 min  Charges: OT General Charges $OT Visit: 1 Visit OT Treatments $Self Care/Home Management : 23-37 mins  Lodema Hong, [place]  Office [phone]   Trixie Dredge 07/29/2022, 11:36 AM

## 2022-07-29 NOTE — Discharge Summary (Signed)
Physician Discharge Summary   Patient: Maria [name] MRN: [id number] DOB: [birth year]  Admit date:     07/20/2022  Discharge date: 07/29/22  Discharge Physician: Patrecia Pour   PCP: Carson Myrtle, NP   Recommendations at discharge:  Follow up with cardiology as arranged next week. Discharged on torsemide 16m po BID, potassium 871m po daily, new spironolactone 12.33m34maily. Will need repeat BMP. Continue unna boots at discharge Follow up with PCP in 1-2 weeks.   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 female with a history of lymphedema, morbid obesity, stage IIIa CKD, chronic HFpEF (LVEF 65-70%, G2DD, normal size/function of RV on echo Oct 2022), T2DM, dyslipidemia who presented to the ED with progressive painful swelling in legs despite augmenting oral diuretic, and dysuria. She was diagnosed with cellulitis and UTI, started vancomycin and ceftriaxone, as well as IV lasix, and admitted with cardiology consultation. With continued IV lasix, volume status improved. Discharge weight is 346lbs. Antibiotics have been continued throughout hospitalization with improvement. No further antibiotics are required at discharge. Unna boots were placed and will be continued after  discharge.  Note inpatient rehabilitation was initially recommended by PT and OT. This was pursued, but ultimately denied by the patient's insurance despite peer-to-peer and expedited appeal. Physical therapy was performed frequently during hospitalization and the patient is stable to be discharged home with home health therapies.   Assessment and Plan: Acute on chronic HFpEF:  - Appears more euvolemic, weight 346lbs. Appreciate cardiology insights, will transition to torsemide and twice weekly metolazone at discharge. - Continue strict I/O monitoring and daily weights. - Holding SGLT2i with concurrent UTI, though will be able to restart at discharge. - Started spironolactone 12.33mg17m7. - Dietary salt and fluid restriction reviewed.    Klebsiella UTI: resistant to amp and macrobid.  - Considered Tx completed with 7 days abx.   Cellulitis of bilateral lower legs:  - Completed prolonged course of antibiotics with keflex and doxycycline. - Continue opioid pain control (for chronic back pain/chronic pain syndrome), oxycodone 10mg47m prn. No new prescription provided.   Hypokalemia:  - Continue regular supplementation with 80mEq433mly dosing.   Leg swelling: Multifactorial, prominent component of venous insufficiency, lymphedema is suspected and acute CHF as above.  - Continue unna boots (ABI's 1.06 (R) and 0.90 (L), normal TBI's bilaterally) per HH-RN. - Keep legs elevated    Morbid obesity: Body mass index is 61.27 kg/m.  - Strongly consider bariatric surgery evaluation as an outpatient. - With concurrent diabetes, would consider semaglutide.   T2DM: Diet-controlled. Last HbA1c 6.9% indicating adequate control with no home medications listed.     Dyslipidemia: Not on medication, note intolerance to rosuvastatin. LDL is 56.   HTN:  Well-controlled.  - Recheck at follow up.   AKI on stage IIIa CKD: AKI likely related to renal congestion as CrCl improved and now stabilized with  diuresis. Baseline CrCl appears to be very near 28m/min - Monitor at follow up. - No current indication for acute consultation, though will need to continue longitudinal follow up with [facility], Dr. FRoyce Macadamia - Discussed with the patient that she should hold ibuprofen 8063mdosing as she has been using at home and instead use tylenol in addition to oxycodone.   Weakness: Given the increasing frequency of falls and progressive muscular deconditioning, CIR is recommended and was pursued. PT was provided here very often while awaiting insurance decisions (ultimately denied), and the patient opts to return home in lieu of SNF. We are maximizing home health services.   Anxiety:  - Restarted home prn clonazepam BID   False positive HIV serology: Pt reports has had positive result in the past with negative confirmatory testing per PCP. HIV-1 RNA here negative.  - Would not recommend repeat screening this patient in the future as she is low risk for contracting the virus and testing is fraught.   GERD:  - Continue PPI  Consultants: Cardiology Procedures performed: None  Disposition: Home Diet recommendation:  Discharge Diet Orders (From admission, onward)     Start     Ordered   07/29/22 0000  Diet - low sodium heart healthy        07/29/22 0903           Cardiac diet DISCHARGE MEDICATION: Allergies as of 07/29/2022       Reactions   Amoxicillin-pot Clavulanate Nausea And Vomiting   Projectile vomiting Did it involve swelling of the face/tongue/throat, SOB, or low BP? No Did it involve sudden or severe rash/hives, skin peeling, or any reaction on the inside of your mouth or nose? No Did you need to seek medical attention at a hospital or doctor's office? No When did it last happen?      20-30 years If all above answers are "NO", may proceed with cephalosporin use.   Adhesive [tape] Other (See Comments)   Tears skin off - use paper tape    Codeine Other (See Comments)   hallucinations    Crestor [rosuvastatin Calcium]    Severe muscle cramps   Morphine And Related Nausea And Vomiting, Other (See Comments)   Migraine headaches   Vicodin [hydrocodone-acetaminophen] Other (See Comments)   hallucinations   Latex Rash        Medication List     STOP taking these medications    ibuprofen 800 MG tablet Commonly known as: ADVIL       TAKE these medications    Accu-Chek Guide test strip Generic drug: glucose blood Use as instructed up to 4 times daily   Accu-Chek Guide w/Device Kit Use as directed What changed:  how much to take how to take this when to take this additional instructions   albuterol 108 (90 Base) MCG/ACT inhaler Commonly known as: ProAir HFA Inhale 2 puffs into the lungs every 6 (six) hours as needed for wheezing or shortness of breath.   cholecalciferol 25 MCG (1000 UNIT) tablet Commonly known as: VITAMIN D3 Take 2,000 Units by mouth daily.   clonazePAM 1 MG tablet Commonly known as: KLONOPIN TAKE ONE TABLET BY MOUTH TWICE DAILY AS NEEDED FOR ANXIETY What changed:  reasons to take this additional instructions   cyclobenzaprine 10 MG tablet Commonly known as: FLEXERIL TAKE 1 TABLET BY  MOUTH 2 TIMES A DAY AS NEEDED FOR MUSCLE SPASMS What changed: See the new instructions.   Farxiga 5 MG Tabs tablet Generic drug: dapagliflozin propanediol Take 5 mg by mouth daily.   Metamucil Fiber Chew Chew 3 tablets by mouth at bedtime.   metolazone 5 MG tablet Commonly known as: ZAROXOLYN Take 5 mg by mouth 2 (two) times a week. Per nephrologist on Tuesday and Friday   metoprolol tartrate 25 MG tablet Commonly known as: LOPRESSOR Take 25 mg by mouth 2 (two) times daily.   Narcan 4 MG/0.1ML Liqd nasal spray kit Generic drug: naloxone Place 0.4 mg into the nose daily as needed (opioid overdose).   ondansetron 4 MG disintegrating tablet Commonly known as: Zofran ODT Take 1 tablet (4 mg total) by mouth every 4 (four) hours as needed for  nausea or vomiting.   Oxycodone HCl 10 MG Tabs Take 1 tablet (10 mg total) 4 (four) times daily by mouth. Per [facility] What changed:  when to take this reasons to take this   pantoprazole 40 MG tablet Commonly known as: PROTONIX TAKE ONE TABLET BY MOUTH DAILY   Polyethyl Glycol-Propyl Glycol 0.4-0.3 % Soln Apply 1 drop to eye daily as needed (dry eyes).   potassium chloride SA 20 MEQ tablet Commonly known as: KLOR-CON M Take 4 tablets (80 mEq total) by mouth daily. What changed:  when to take this additional instructions   pramipexole 0.5 MG tablet Commonly known as: MIRAPEX Take 1 tablet (0.5 mg total) by mouth 3 (three) times daily.   spironolactone 25 MG tablet Commonly known as: ALDACTONE Take 0.5 tablets (12.5 mg total) by mouth daily.   torsemide 20 MG tablet Commonly known as: DEMADEX Take 3 tablets (60 mg total) by mouth 2 (two) times daily. Take 2 tablets in the morning and 1 tablet in the afternoon. What changed:  how much to take how to take this when to take this        Follow-up [facility] Follow up.   Why: [facility] will continue to provide home health services in your home.  Please follow up. Contact information: [place] [address] [phone]         Carson Myrtle, NP. Schedule an appointment as soon as possible for a visit.   Specialty: Nurse Practitioner Contact information: [place] [address] [phone]         [facility] Follow up on 08/03/2022.   Specialty: Cardiology Why: Please go to the [facility] office at [facility] to have labs drawn on 8/14. There is no need to fast Contact information: [place], [place] Follow up.   Specialty: Home Health Services Why: [facility] will call you in the next 24-48 hours to set up home  health services. Contact information: [address] [place] [address] [phone]         Minus Breeding, MD .   Specialty: Cardiology Contact information: [address] [place] [address] [phone]                Discharge Exam: Danley Danker Weights   07/27/22 0213 07/28/22 0500 07/29/22 0500  Weight: (!) 160 kg (!) 156.9 kg (!) 157.4 kg  Pleasant obese female in no distress Clear, nonlabored RRR with stable soft early  systolic apical murmur, no rub or gallop. Unna boots bilaterally with palpable DP pulses on previous exams.   Condition at discharge: stable  The results of significant diagnostics from this hospitalization (including imaging, microbiology, ancillary and laboratory) are listed below for reference.   Imaging Studies: No results found.  Microbiology: Results for orders placed or performed during the hospital encounter of 07/20/22  Urine Culture     Status: Abnormal   Collection Time: 07/21/22 12:15 AM   Specimen: Urine, Clean Catch  Result Value Ref Range Status   Specimen Description URINE, CLEAN CATCH  Final   Special Requests   Final    NONE Performed at [facility], [place] [address] [place], [place] [address]    Culture >=100,000 COLONIES/mL KLEBSIELLA PNEUMONIAE (A)  Final   Report Status 07/23/2022 FINAL  Final   Organism ID, Bacteria KLEBSIELLA PNEUMONIAE (A)  Final      Susceptibility   Klebsiella pneumoniae - MIC*    AMPICILLIN >=32 RESISTANT Resistant     CEFAZOLIN <=4 SENSITIVE Sensitive     CEFEPIME <=0.12 SENSITIVE Sensitive     CEFTRIAXONE <=0.25 SENSITIVE Sensitive     CIPROFLOXACIN <=0.25 SENSITIVE Sensitive     GENTAMICIN <=1 SENSITIVE Sensitive     IMIPENEM <=0.25 SENSITIVE Sensitive     NITROFURANTOIN 64 INTERMEDIATE Intermediate     TRIMETH/SULFA <=20 SENSITIVE Sensitive     AMPICILLIN/SULBACTAM 4 SENSITIVE Sensitive     PIP/TAZO <=4 SENSITIVE Sensitive     * >=100,000 COLONIES/mL  KLEBSIELLA PNEUMONIAE    Labs: CBC: No results for input(s): "WBC", "NEUTROABS", "HGB", "HCT", "MCV", "PLT" in the last 168 hours. Basic Metabolic Panel: Recent Labs  Lab 07/23/22 0546 07/24/22 0704 07/25/22 0331 07/26/22 0557 07/27/22 0630 07/29/22 0543  NA 137 140 138 139 140 139  K 2.8* 3.0* 3.5 3.9 3.7 3.8  CL 97* 99 102 102 102 101  CO2 '29 30 30 ' 32 29 31  GLUCOSE 103* 131* 113* 124* 127* 114*  BUN '19 16 18 18 19 21  ' CREATININE 1.50* 1.09* 1.41* 1.10* 1.06* 1.25*  CALCIUM 8.1* 8.3* 8.4* 8.6* 8.4* 8.7*  MG 1.8  --  1.9  --  1.9  --    Liver Function Tests: No results for input(s): "AST", "ALT", "ALKPHOS", "BILITOT", "PROT", "ALBUMIN" in the last 168 hours. CBG: Recent Labs  Lab 07/28/22 0759 07/28/22 1140 07/28/22 1622 07/28/22 2056 07/29/22 0758  GLUCAP 104* 101* 118* 153* 109*    Discharge time spent: greater than 30 minutes.  Signed: Patrecia Pour, MD [facility] 07/29/2022

## 2022-07-29 NOTE — Progress Notes (Signed)
ABI has been completed.   Preliminary results in CV Proc.   Jinny Blossom Desha Bitner 07/29/2022 1:39 PM

## 2022-07-29 NOTE — Progress Notes (Signed)
Patient discharge orders have been placed. TOC pharmacy has delivered hoome medications to bedside. AVS has been printed and reviewed with the patient. Transport via [facility] arranged for approximately 1pm

## 2022-08-03 ENCOUNTER — Other Ambulatory Visit: Payer: Self-pay

## 2022-08-03 ENCOUNTER — Telehealth: Payer: Self-pay | Admitting: Licensed Clinical Social Worker

## 2022-08-03 DIAGNOSIS — E876 Hypokalemia: Secondary | ICD-10-CM

## 2022-08-03 DIAGNOSIS — I5033 Acute on chronic diastolic (congestive) heart failure: Secondary | ICD-10-CM

## 2022-08-03 NOTE — Telephone Encounter (Signed)
H&V Care Navigation CSW Progress Note  Clinical Social Worker contacted patient by phone to f/u on her recent hospitalization. LCSW was able to speak with pt this morning at [phone]. Pt answered, she is doing well- feeling much better since her hospital stay and diuresing. She was able to have her first visit with [facility] who has ordered her [employer] which arrived yesterday. She was a bit worried b/c the nurse who came stated she didn't think pt needed unna boots, and she trusts what the MD team ordered. I shared if she has any issues with HHRN or orders then she is encouraged to f/u with the nurse triage line for clarification or can ask Denyse Amass when she comes to appt. She was provided with appt details and will schedule her ride today. Overall, very pleased at how she feels and was highly complimentary of team inpatient- I will pass this on to care providers. Continues to be pleased also with her [facility] team. She was encouraged to continue to reach out as needed with any questions/concerns.   Patient is participating in a Managed Medicaid Plan:  No, [place] Medicare only  SDOH Screenings   Alcohol Screen: Low Risk  (12/30/2021)   Alcohol Screen    Last Alcohol Screening Score (AUDIT): 0  Depression (PHQ2-9): Medium Risk (01/30/2022)   Depression (PHQ2-9)    PHQ-2 Score: 9  Financial Resource Strain: Medium Risk (12/30/2021)   Overall Financial Resource Strain (CARDIA)    Difficulty of Paying Living Expenses: Somewhat hard  Food Insecurity: No Food Insecurity (04/03/2022)   Hunger Vital Sign    Worried About Running Out of Food in the Last Year: Never true    Ran Out of Food in the Last Year: Never true  Housing: Low Risk  (04/03/2022)   Housing    Last Housing Risk Score: 0  Physical Activity: Inactive (12/30/2021)   Exercise Vital Sign    Days of Exercise per Week: 0 days    Minutes of Exercise per Session: 0 min  Social Connections: Socially Isolated (12/30/2021)   Social  Connection and Isolation Panel [NHANES]    Frequency of Communication with Friends and Family: More than three times a week    Frequency of Social Gatherings with Friends and Family: More than three times a week    Attends Religious Services: Never    Marine scientist or Organizations: No    Attends Archivist Meetings: Never    Marital Status: Never married  Stress: No Stress Concern Present (12/30/2021)   [employer] [place] of Stress : Not at all  Tobacco Use: Medium Risk (07/21/2022)   Patient History    Smoking Tobacco Use: Former    Smokeless Tobacco Use: Never    Passive Exposure: Not on file  Transportation Needs: Unmet Transportation Needs (04/22/2022)   PRAPARE - Hydrologist (Medical): No    Lack of Transportation (Non-Medical): Yes   Westley Hummer, MSW, [place]  [phone]- work cell phone (preferred) [phone]- desk phone

## 2022-08-04 LAB — BASIC METABOLIC PANEL
BUN/Creatinine Ratio: 11 — ABNORMAL LOW (ref 12–28)
BUN: 18 mg/dL (ref 8–27)
CO2: 25 mmol/L (ref 20–29)
Calcium: 9.3 mg/dL (ref 8.7–10.3)
Chloride: 99 mmol/L (ref 96–106)
Creatinine, Ser: 1.63 mg/dL — ABNORMAL HIGH (ref 0.57–1.00)
Glucose: 114 mg/dL — ABNORMAL HIGH (ref 70–99)
Potassium: 4.6 mmol/L (ref 3.5–5.2)
Sodium: 140 mmol/L (ref 134–144)
eGFR: 35 mL/min/{1.73_m2} — ABNORMAL LOW (ref >59)

## 2022-08-05 ENCOUNTER — Other Ambulatory Visit (HOSPITAL_COMMUNITY): Payer: Self-pay

## 2022-08-06 ENCOUNTER — Ambulatory Visit: Payer: Medicare Other | Admitting: General Practice

## 2022-08-07 NOTE — Progress Notes (Unsigned)
Cardiology Clinic Note   Patient Name: Maria [name] Date of Encounter: 07/17/2022  Primary Care Provider:  Carson Myrtle, NP Primary Cardiologist:  Minus Breeding, MD  Patient Profile    Maria [name] 66 year old female presents to clinic today for follow-up evaluation of her chronic diastolic CHF.  Past Medical History    Past Medical History:  Diagnosis Date   Acute lymphadenitis 2011   ALLERGIC RHINITIS 08/10/2007   ANXIETY 12/06/2007   Atherosclerotic peripheral vascular disease ([place]) 06/13/2013   Aorta on CT June 2014   Cellulitis 2011   Cervical disc disease 03/09/2012   CHF (congestive heart failure) ([place])    Chronic pain 03/09/2012   DEPRESSION 12/06/2007   Diabetes ([employer])    DIVERTICULOSIS, [place] 12/06/2007   GERD 12/06/2007   Hepatitis    age 43 hepatitis A   HLD (hyperlipidemia) 05/24/2019   HYPERTENSION 12/06/2007   Lumbar disc disease 03/09/2012   Mesenteric adenitis    MRSA 2006   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 2010   [place]  may 24th 2010   COLONOSCOPY WITH PROPOFOL N/A 07/10/2016   Procedure: COLONOSCOPY WITH PROPOFOL;  Surgeon: Manus Gunning, MD;  Location: [facility];  Service: Gastroenterology;  Laterality: N/A;   COLONOSCOPY WITH PROPOFOL N/A 09/26/2019   Procedure: COLONOSCOPY WITH PROPOFOL;  Surgeon: Yetta Flock, MD;  Location: [facility];  Service: Gastroenterology;  Laterality: N/A;   ESOPHAGOGASTRODUODENOSCOPY (EGD) WITH PROPOFOL N/A 07/10/2016   Procedure: ESOPHAGOGASTRODUODENOSCOPY (EGD) WITH PROPOFOL;  Surgeon: Manus Gunning, MD;  Location: [facility];  Service: Gastroenterology;  Laterality: N/A;   POLYPECTOMY  09/26/2019   Procedure: POLYPECTOMY;  Surgeon: Yetta Flock, MD;  Location: [facility];  Service: Gastroenterology;;   s/p ovary cyst     s/p right knee arthroscopy     Dr. Mardelle Matte ortho    Allergies  Allergies  Allergen Reactions   Amoxicillin-Pot Clavulanate Nausea And Vomiting    Projectile vomiting Did it involve swelling of the face/tongue/throat, SOB, or low BP? No Did it involve sudden or severe rash/hives, skin peeling, or any reaction on the inside of your mouth or nose? No Did you need to seek medical attention at a hospital or doctor's office? No When did it last happen?      20-30 years If all above answers are "NO", may proceed with cephalosporin use.    Adhesive [Tape] Other (See Comments)    Tears skin off - use paper tape    Codeine Other (See Comments)    hallucinations   Crestor [Rosuvastatin Calcium]     Severe muscle cramps   Morphine And Related Nausea And Vomiting and Other (See Comments)    Migraine headaches   Vicodin [Hydrocodone-Acetaminophen] Other (See Comments)    hallucinations   Latex Rash    History of Present Illness    Maria [name] has a PMH of hyperlipidemia, hypertension, GERD, anxiety, depression, type 2 diabetes, and CHF.  Echocardiogram showed an EF of 81-44% with diastolic dysfunction 08/07/5630.  She presented to the emergency department 08/10/2021 with increased shortness of breath.  She noted a 13 pound weight gain over the previous 7 days with worsening lower extremity edema.  She did note PND and orthopnea.  She was unable to lay flat and was sleeping in her recliner.  She reported taking torsemide as prescribed.  She denied chest discomfort.  She also denied infectious symptoms aside from increased shortness of breath and increased urinary frequency due to her diuretic.  She did note mild dysuria but denied fever and chills.  Her blood pressure at that time was 123/76 with a pulse of 66.  Her creatinine was 1.05.  Her EKG showed normal sinus rhythm.  She received IV furosemide.  Her chest  x-ray showed persistent cardiac enlargement and central pulmonary vascular engorgement with no change from prior chest x-rays, no signs of lobar consolidation, pleural effusion, or frank edema.  Her BNP at that time was 22.  Troponins were normal.  It was also felt that she had a early UTI.  She was also prescribed Keflex.  Her CTA was negative for acute PE with no evidence of pneumo, pleural effusion, pericardial effusion, aortic dissection or focal consolidation.  Social work evaluated and recommended home physical therapy, prescription for walker.  It was also recommended that she use lower extremity support stockings and elevate her lower extremities to assist with her lower extremity edema.  She was discharged in stable condition on 08/11/2021.   Due to her limits and mobility she was unable to present to the clinic 08/18/2021.  She  was contacted via phone and stated she was frustrated with her lower extremity swelling.  She reported that she had ordered lower extremity support stockings and was excited to give them a try.  She reported that she did not add salt to her food.  She had been consuming about 4 bottles of water per day along with tea.  We reviewed the importance of selecting food options that are low/no salt and fluid restriction.  I recommended that she drink no more than about 48 ounces per day.  She had been taking her torsemide at about 9 or 10:00 and then again at 5 or 6:00 in the evening.  She had not been sleeping well due to frequent urination.  We discussed taking her medication earlier in the day.  I have encouraged her to continue to weigh herself daily and keep a log, Igave her the salty 6 diet sheet, gave parameters for fluid restriction, and asked her to follow-up in 3 months and as needed.  She was seen in follow-up by Almyra Deforest, PA-C on 09/09/2021.  She took 2.5 mg of metolazone and did not notice significant urine output.  She was still taking torsemide 60 mg twice daily.  Her  weight at home was around 367 pounds.  Her weight diary was reviewed and her weight was ranging between 367 pounds up to 375 pounds.  On physical exam she did not have significant lower extremity edema.  Her lung sounds were noted to be clear.  She was euvolemic on exam.  Her medication was continued.  Her BMP showed a creatinine of 1.18 and a potassium of 3.6.   She was seen in follow-up by Dr. Percival Spanish on 04/21/2022.  During that time her weight was around 356 pounds.  She reported she felt like she was carrying more weight in her legs.  She felt like she was having some shortness of breath with ambulation.  She was ambulating with a walker.  She was not having any new PND or orthopnea.  She was noted to have had a fall which resulted in her hitting her head.  She did not want  to go to the emergency department.  She was noted to have some shoulder pain related to her fall.  She denied chest pressure, arm and neck discomfort.  She was not having any new palpitations.  She denied presyncope and syncope.  Her heart rate was noted to be 58.  Her Zaroxolyn was increased to daily for 5 days along with extra potassium.  Salt and fluid restriction were reviewed.  Her BMP on 04/30/2022 showed a potassium of 2.7 and a creatinine of 1.09.  Her follow-up lab work 05/04/2022 was stable.  She presented to the clinic 06/22/2022 for follow-up evaluation stated she had been having difficulty keeping her fluid off.  Her weight  was 368 pounds.  We reviewed the importance of fluid restriction and sodium restriction.  She expressed understanding.  She reported compliance with her medications.  She did mention that she had been drinking around 90 ounces per day.  She tried to elevate her lower extremities when she was not active.  She reported that the last time she was sent to the emergency department the emergency department physician was not nice to her.  I  doubled her diuresis, asked her take metolazone 3 times this week, ordered a  bmet in 1 week, and plan follow-up in 2 weeks.  Her BMP showed a potassium of 2.9 which was improved from 2.7.  Her creatinine was 1.65.  I increased her potassium.  She presents to clinic today for follow-up evaluation states her weight at home is 358 pounds.  She continues to have weeping from her lower extremities.  She has been trying to keep them clean and dry.  She denies fever and chills.  She has been following fluid restriction and reports that her breathing has improved.  We reviewed fluid restriction.  I will have her continue to weigh herself daily, order a BMP, have her monitor for signs of infection, continue her current medication regimen and plan follow-up in 2 to 3 months.  Today she denies chest pain, shortness of breath, fatigue, palpitations, melena, hematuria, hemoptysis, diaphoresis, weakness, presyncope, syncope, orthopnea, and PND.  Acute on chronic diastolic CHF-weight today ***361lbs  1+ bilateral lower extremity pitting edema.  Again reviewed importance of fluid restriction and low-sodium diet. Continue metoprolol, losartan, potassium, torsemide, metolazone Heart healthy low-sodium diet-salty 6 given Increase physical activity as tolerated Fluid restriction 48-56 ounces or less Maintain weight log Elevate lower extremities when not active Wear lower extremity support stockings BMP  Essential hypertension-BP today ***139/67.  Well-controlled post diuresis.  Continue metoprolol, losartan Heart healthy low-sodium diet Increase physical activity as tolerated    Disposition: Follow-up with Dr. Percival Spanish  in 4-6 months.   Home Medications    Prior to Admission medications   Medication Sig Start Date End Date Taking? Authorizing Provider  Accu-Chek Softclix Lancets lancets Use as directed up to 4 times daily Patient taking differently: 1 each by Other route in the morning, at noon, in the evening, and at bedtime. 04/11/21   Elodia Florence., MD  acetaminophen  (TYLENOL) 325 MG tablet Take 650 mg by mouth every 6 (six) hours as needed for headache or fever (pain).    [provider]  albuterol (PROAIR HFA) 108 (90 Base) MCG/ACT inhaler Inhale 2 puffs into the lungs every 6 (six) hours as needed for wheezing or shortness of breath. 11/05/20   Lucretia Kern, DO  Blood Glucose Monitoring Suppl (ACCU-CHEK GUIDE) w/Device KIT Use as directed Patient taking differently: 1 each  by Other route as directed. 04/11/21   Elodia Florence., MD  cholecalciferol (VITAMIN D3) 25 MCG (1000 UNIT) tablet Take 2,000 Units by mouth daily.    [provider]  citalopram (CELEXA) 20 MG tablet Take 1 tablet (20 mg total) by mouth daily. 01/30/22   Biagio Borg, MD  clonazePAM (KLONOPIN) 1 MG tablet TAKE ONE TABLET BY MOUTH TWICE DAILY AS NEEDED FOR ANXIETY 06/04/22   Biagio Borg, MD  cyclobenzaprine (FLEXERIL) 10 MG tablet TAKE 1 TABLET BY MOUTH 2 TIMES A DAY AS NEEDED FOR MUSCLE SPASMS Patient taking differently: Take 10 mg by mouth daily as needed. TAKE 1 TABLET BY MOUTH 2 TIMES A DAY AS NEEDED FOR MUSCLE SPASMS 02/27/21   Biagio Borg, MD  Fexofenadine HCl (ALLEGRA PO) Take 180 mg by mouth daily as needed (allergies).    [provider]  glucose blood (ACCU-CHEK GUIDE) test strip Use as instructed up to 4 times daily 04/11/21   Elodia Florence., MD  Magnesium 400 MG TABS Take 400 mg by mouth in the morning and at bedtime. 03/10/22   Hoyt Koch, MD  Metamucil Fiber CHEW Chew 3 tablets by mouth at bedtime.    [provider]  metolazone (ZAROXOLYN) 5 MG tablet Take 5 mg by mouth 2 (two) times a week. Per nephrologist    Claudia Desanctis, MD  metoprolol tartrate (LOPRESSOR) 25 MG tablet Take 25 mg by mouth 2 (two) times daily.    [provider]  NARCAN 4 MG/0.1ML LIQD nasal spray kit Place 0.4 mg into the nose daily as needed (opioid overdose). 04/25/19   [provider]  ondansetron (ZOFRAN ODT) 4 MG  disintegrating tablet Take 1 tablet (4 mg total) by mouth every 4 (four) hours as needed for nausea or vomiting. 08/23/19   Charlesetta Shanks, MD  Oxycodone HCl 10 MG TABS Take 1 tablet (10 mg total) 4 (four) times daily by mouth. Per [facility] Patient taking differently: Take 10 mg by mouth every 4 (four) hours as needed (pain). Per [facility] 11/08/17   Biagio Borg, MD  pantoprazole (PROTONIX) 40 MG tablet TAKE ONE TABLET BY MOUTH DAILY 01/29/22   Biagio Borg, MD  Polyethyl Glycol-Propyl Glycol 0.4-0.3 % SOLN Apply 1 drop to eye daily as needed (dry eyes).    [provider]  polyethylene glycol powder (GLYCOLAX/MIRALAX) 17 GM/SCOOP powder Take 17 g by mouth daily as needed for mild constipation. 04/11/21   Elodia Florence., MD  potassium chloride SA (KLOR-CON M) 20 MEQ tablet 2 tablets in the morning and 1 tablet in the afternoon 04/23/22   Minus Breeding, MD  pramipexole (MIRAPEX) 0.5 MG tablet Take 1 tablet (0.5 mg total) by mouth 3 (three) times daily. 02/03/22   Biagio Borg, MD  Semaglutide, 1 MG/DOSE, 4 MG/3ML SOPN Inject 1 mg as directed once a week. 01/30/22   Biagio Borg, MD  torsemide (DEMADEX) 20 MG tablet Take 2 tablets in the morning and 1 tablet in the afternoon. Patient taking differently: 10m twice daily - per nephrologist 12/30/21   HMinus Breeding MD  DULoxetine (CYMBALTA) 60 MG capsule Take 60 mg by mouth daily.    03/09/12  [provider]    Family History    Family History  Problem Relation Age of Onset   Heart disease Mother    Hypertension Mother    Diabetes Mother    Heart failure Mother  Asthma Sister    Anxiety disorder Sister    Depression Sister    Hypertension Father    Asthma Daughter    Bipolar disorder Daughter    Cancer Maternal Uncle        [place]   Cancer Other        ovarian   Liver cancer Paternal Grandmother        ????   She indicated that her mother is deceased. She indicated that her father is  alive. She indicated that her sister is alive. She indicated that her brother is alive. She indicated that the status of her paternal grandmother is unknown. She indicated that the status of her daughter is unknown. She indicated that her child is alive. She indicated that the status of her maternal uncle is unknown. She indicated that the status of her other is unknown.  Social History    Social History   Socioeconomic History   Marital status: Divorced    Spouse name: Not on file   Number of children: 2   Years of education: Not on file   Highest education level: Not on file  Occupational History   Occupation: disabled  Tobacco Use   Smoking status: Former    Years: 30.00    Types: Cigarettes    Quit date: 11/08/2008    Years since quitting: 13.7   Smokeless tobacco: Never   Tobacco comments:    quit 10/09  Vaping Use   Vaping Use: Never used  Substance and Sexual Activity   Alcohol use: No    Comment: quit drinking 10/07/1997   Drug use: No   Sexual activity: Not Currently    Birth control/protection: Surgical, Abstinence  Other Topics Concern   Not on file  Social History Narrative   Patient has difficulty financially affording food, medication, and affording essential bills. She states she has limited support from her daughter.    Social Determinants of Health   Financial Resource Strain: Medium Risk (12/30/2021)   Overall Financial Resource Strain (CARDIA)    Difficulty of Paying Living Expenses: Somewhat hard  Food Insecurity: No Food Insecurity (04/03/2022)   Hunger Vital Sign    Worried About Running Out of Food in the Last Year: Never true    Ran Out of Food in the Last Year: Never true  Transportation Needs: Unmet Transportation Needs (04/22/2022)   PRAPARE - Transportation    Lack of Transportation (Medical): No    Lack of Transportation (Non-Medical): Yes  Physical Activity: Inactive (12/30/2021)   Exercise Vital Sign    Days of Exercise per Week: 0 days     Minutes of Exercise per Session: 0 min  Stress: No Stress Concern Present (12/30/2021)   [place]    Feeling of Stress : Not at all  Social Connections: Socially Isolated (12/30/2021)   Social Connection and Isolation Panel [NHANES]    Frequency of Communication with Friends and Family: More than three times a week    Frequency of Social Gatherings with Friends and Family: More than three times a week    Attends Religious Services: Never    Marine scientist or Organizations: No    Attends Archivist Meetings: Never    Marital Status: Never married  Intimate Partner Violence: Not At Risk (12/30/2021)   Humiliation, Afraid, Rape, and Kick questionnaire    Fear of Current or Ex-Partner: No    Emotionally Abused: No  Physically Abused: No    Sexually Abused: No     Review of Systems    General:  No chills, fever, night sweats or weight changes.  Cardiovascular:  No chest pain, dyspnea on exertion, edema, orthopnea, palpitations, paroxysmal nocturnal dyspnea. Dermatological: No rash, lesions/masses Respiratory: No cough, dyspnea Urologic: No hematuria, dysuria Abdominal:   No nausea, vomiting, diarrhea, bright red blood per rectum, melena, or hematemesis Neurologic:  No visual changes, wkns, changes in mental status. All other systems reviewed and are otherwise negative except as noted above.  Physical Exam    VS:  There were no vitals taken for this visit. , BMI There is no height or weight on file to calculate BMI. GEN: Well nourished, well developed, in no acute distress. HEENT: normal. Neck: Supple, no JVD, carotid bruits, or masses. Cardiac: RRR, no murmurs, rubs, or gallops. No clubbing, cyanosis, 2+ pitting bilateral lower extremity edema with bilateral slight ankle weeping.  Radials/DP/PT 2+ and equal bilaterally.  Respiratory:  Respirations regular and unlabored, clear to auscultation  bilaterally. GI: Soft, nontender, nondistended, BS + x 4. MS: no deformity or atrophy. Skin: warm and dry, no rash.  No signs of infection Neuro:  Strength and sensation are intact. Psych: Normal affect.  Accessory Clinical Findings    Recent Labs: 01/30/2022: TSH 3.19 04/30/2022: B Natriuretic Peptide 99.0 07/20/2022: ALT 25 07/21/2022: Hemoglobin 11.1; Platelets 240 07/27/2022: Magnesium 1.9 08/03/2022: BUN 18; Creatinine, Ser 1.63; Potassium 4.6; Sodium 140   Recent Lipid Panel    Component Value Date/Time   CHOL 126 01/30/2022 1342   TRIG 120.0 01/30/2022 1342   TRIG 126 12/31/2006 0847   HDL 45.40 01/30/2022 1342   CHOLHDL 3 01/30/2022 1342   VLDL 24.0 01/30/2022 1342   LDLCALC 56 01/30/2022 1342   LDLDIRECT 127.0 01/25/2019 1423    ECG personally reviewed by me today-none today.  Echocardiogram 09/20/2021 IMPRESSIONS     1. Left ventricular ejection fraction, by estimation, is 65 to 70%. The  left ventricle has normal function. The left ventricle has no regional  wall motion abnormalities. Left ventricular diastolic parameters are  consistent with Grade II diastolic  dysfunction (pseudonormalization). Elevated left ventricular end-diastolic  pressure.   2. Right ventricular systolic function is normal. The right ventricular  size is normal. Tricuspid regurgitation signal is inadequate for assessing  PA pressure.   3. Left atrial size was upper normal.   4. The mitral valve is grossly normal. No evidence of mitral valve  regurgitation.   5. The aortic valve is tricuspid. Aortic valve regurgitation is not  visualized.   6. The inferior vena cava is dilated in size with >50% respiratory  variability, suggesting right atrial pressure of 8 mmHg.   Comparison(s): Prior images reviewed side by side. LVEF vigorous with  probable increased LVEDP.  Assessment & Plan   1.     Jossie Ng. Cleaver NP-C     08/07/2022, 7:01 AM [place] [address] Office [phone] Fax [phone]  Notice: This dictation was prepared with Dragon dictation along with smaller phrase technology. Any transcriptional errors that result from this process are unintentional and may not be corrected upon review.  I spent 13 minutes examining this patient, reviewing medications, and using patient centered shared decision making involving her cardiac care.  Prior to her visit I spent greater than 20 minutes reviewing her past medical history,  medications, and prior cardiac tests.

## 2022-08-10 ENCOUNTER — Telehealth: Payer: Self-pay | Admitting: *Deleted

## 2022-08-10 ENCOUNTER — Telehealth: Payer: Self-pay | Admitting: Nurse Practitioner

## 2022-08-10 ENCOUNTER — Encounter: Payer: Self-pay | Admitting: Nurse Practitioner

## 2022-08-10 ENCOUNTER — Ambulatory Visit (INDEPENDENT_AMBULATORY_CARE_PROVIDER_SITE_OTHER): Payer: Medicare Other | Admitting: Nurse Practitioner

## 2022-08-10 VITALS — BP 122/74 | HR 65 | Ht 63.0 in | Wt 343.0 lb

## 2022-08-10 DIAGNOSIS — Z79899 Other long term (current) drug therapy: Secondary | ICD-10-CM

## 2022-08-10 DIAGNOSIS — I10 Essential (primary) hypertension: Secondary | ICD-10-CM

## 2022-08-10 DIAGNOSIS — L03119 Cellulitis of unspecified part of limb: Secondary | ICD-10-CM

## 2022-08-10 DIAGNOSIS — I5033 Acute on chronic diastolic (congestive) heart failure: Secondary | ICD-10-CM

## 2022-08-10 DIAGNOSIS — R42 Dizziness and giddiness: Secondary | ICD-10-CM | POA: Diagnosis not present

## 2022-08-10 DIAGNOSIS — I447 Left bundle-branch block, unspecified: Secondary | ICD-10-CM

## 2022-08-10 LAB — BASIC METABOLIC PANEL
BUN/Creatinine Ratio: 14 (ref 12–28)
BUN: 25 mg/dL (ref 8–27)
CO2: 35 mmol/L — ABNORMAL HIGH (ref 20–29)
Calcium: 9.4 mg/dL (ref 8.7–10.3)
Chloride: 82 mmol/L — ABNORMAL LOW (ref 96–106)
Creatinine, Ser: 1.73 mg/dL — ABNORMAL HIGH (ref 0.57–1.00)
Glucose: 150 mg/dL — ABNORMAL HIGH (ref 70–99)
Potassium: 2.8 mmol/L — CL (ref 3.5–5.2)
Sodium: 135 mmol/L (ref 134–144)
eGFR: 32 mL/min/{1.73_m2} — ABNORMAL LOW (ref >59)

## 2022-08-10 LAB — CBC
Hematocrit: 37 % (ref 34.0–46.6)
Hemoglobin: 12.5 g/dL (ref 11.1–15.9)
MCH: 30 pg (ref 26.6–33.0)
MCHC: 33.8 g/dL (ref 31.5–35.7)
MCV: 89 fL (ref 79–97)
Platelets: 339 10*3/uL (ref 150–450)
RBC: 4.17 x10E6/uL (ref 3.77–5.28)
RDW: 13.5 % (ref 11.7–15.4)
WBC: 9.6 10*3/uL (ref 3.4–10.8)

## 2022-08-10 LAB — TSH: TSH: 2.35 u[IU]/mL (ref 0.450–4.500)

## 2022-08-10 NOTE — Telephone Encounter (Signed)
S/w patient on the phone and updated her regarding her recent BMET results. Will hold metolazone dose due tomorrow and give an extra 40 mEq of Potassium X 2 days and repeat BMET for this Thursday. Will reach out to [place] regarding her transportation issue. She said transporter would not help her with getting rolator back to house. Will hold off on Spironoloactone once BMET results are back. If kidney function improves, may start Spironolactone. She verbalized understanding and was appreciative of the call.   Finis Bud, NP

## 2022-08-10 NOTE — Patient Instructions (Signed)
Medication Instructions:  WAIT TO START SPIRONOLACTONE UNTIL AFTER LAB RESULTS-WE WILL CALL YOU  *If you need a refill on your cardiac medications before your next appointment, please call your pharmacy*  Lab Work:     Testing/Procedures:  CBC,BMET,MAG AND TSH TODAY  NONE  If you have labs (blood work) drawn today and your tests are completely normal, you will receive your results only by:  1-MyChart Message (if you have MyChart) OR 2- A paper copy in the mail.  If you have any lab test that is abnormal or we need to change your treatment, we will call you to review the results.  Special Instructions PLEASE READ AND FOLLOW SALTY 6-ATTACHED-1,'800mg'$  daily  TAKE AND LOG YOUR BLOOD PRESSURE  Follow-Up: Your next appointment:  3-4 month(s) In Person with Minus Breeding, MD   At [facility], you and your health needs are our priority.  As part of our continuing mission to provide you with exceptional heart care, we have created designated Provider Care Teams.  These Care Teams include your primary Cardiologist (physician) and Advanced Practice Providers (APPs -  Physician Assistants and Nurse Practitioners) who all work together to provide you with the care you need, when you need it.  Important Information About Sugar             6 SALTY THINGS TO AVOID     1,'800MG'$  DAILY

## 2022-08-10 NOTE — Telephone Encounter (Signed)
Critical  value  K+2.3  from todays's lab  The information is in the epic system.  Forward to Annita Brod NP

## 2022-08-11 ENCOUNTER — Other Ambulatory Visit: Payer: Self-pay

## 2022-08-11 ENCOUNTER — Telehealth: Payer: Self-pay | Admitting: Licensed Clinical Social Worker

## 2022-08-11 DIAGNOSIS — Z79899 Other long term (current) drug therapy: Secondary | ICD-10-CM

## 2022-08-11 NOTE — Telephone Encounter (Signed)
H&V Care Navigation CSW Progress Note  Clinical Social Worker contacted patient by phone to f/u on transportation issues- aware pt needs to come in this Thursday to have bloodwork. No answer at [phone]. I left a message for pt letting her know I am trying to get in touch with her and see if she had been able to enroll with [facility]. I think that due to her hospitalization there may have been a delay in this process. We may be able to utilize [employer], I also have called [facility] liaison Cecille Rubin to see if bloodwork could be done at home.   Patient is participating in a Managed Medicaid Plan:  No, UHC Medicare only.   SDOH Screenings   Alcohol Screen: Low Risk  (12/30/2021)   Alcohol Screen    Last Alcohol Screening Score (AUDIT): 0  Depression (PHQ2-9): Medium Risk (01/30/2022)   Depression (PHQ2-9)    PHQ-2 Score: 9  Financial Resource Strain: Medium Risk (12/30/2021)   Overall Financial Resource Strain (CARDIA)    Difficulty of Paying Living Expenses: Somewhat hard  Food Insecurity: No Food Insecurity (04/03/2022)   Hunger Vital Sign    Worried About Running Out of Food in the Last Year: Never true    Ran Out of Food in the Last Year: Never true  Housing: Low Risk  (04/03/2022)   Housing    Last Housing Risk Score: 0  Physical Activity: Inactive (12/30/2021)   Exercise Vital Sign    Days of Exercise per Week: 0 days    Minutes of Exercise per Session: 0 min  Social Connections: Socially Isolated (12/30/2021)   Social Connection and Isolation Panel [NHANES]    Frequency of Communication with Friends and Family: More than three times a week    Frequency of Social Gatherings with Friends and Family: More than three times a week    Attends Religious Services: Never    Marine scientist or Organizations: No    Attends Archivist Meetings: Never    Marital Status: Never married  Stress: No Stress Concern Present (12/30/2021)   [employer] of [place] of Stress : Not at all  Tobacco Use: Medium Risk (08/10/2022)   Patient History    Smoking Tobacco Use: Former    Smokeless Tobacco Use: Never    Passive Exposure: Not on file  Transportation Needs: Unmet Transportation Needs (04/22/2022)   PRAPARE - Hydrologist (Medical): No    Lack of Transportation (Non-Medical): Yes    Westley Hummer, MSW, [place]  [phone]- work cell phone (preferred) [phone]- desk phone

## 2022-08-11 NOTE — Telephone Encounter (Signed)
H&V Care Navigation CSW Progress Note  Clinical Social Worker contacted patient by phone to f/u again on transportation. Was able to connect with pt this afternoon at [phone]. Pt shares she connected with [facility] but then didn't hear anything further- LCSW ensured pt has their number and I explained that we can assist with Safe Transport for this appt but it may not be something we can assist with everytime since pt has benefits under her insurance and [place] application has been submitted. Pt understands- was given Civil engineer, contracting number and this Probation officer was able to submit a ride request to [employer] for pick up on 8/24 to get pt to labs at office. I remain available for any additional questions/concerns.   Patient is participating in a Managed Medicaid Plan:  No, UHC Medicare only.  SDOH Screenings   Alcohol Screen: Low Risk  (12/30/2021)   Alcohol Screen    Last Alcohol Screening Score (AUDIT): 0  Depression (PHQ2-9): Medium Risk (01/30/2022)   Depression (PHQ2-9)    PHQ-2 Score: 9  Financial Resource Strain: Medium Risk (12/30/2021)   Overall Financial Resource Strain (CARDIA)    Difficulty of Paying Living Expenses: Somewhat hard  Food Insecurity: No Food Insecurity (04/03/2022)   Hunger Vital Sign    Worried About Running Out of Food in the Last Year: Never true    Ran Out of Food in the Last Year: Never true  Housing: Low Risk  (04/03/2022)   Housing    Last Housing Risk Score: 0  Physical Activity: Inactive (12/30/2021)   Exercise Vital Sign    Days of Exercise per Week: 0 days    Minutes of Exercise per Session: 0 min  Social Connections: Socially Isolated (12/30/2021)   Social Connection and Isolation Panel [NHANES]    Frequency of Communication with Friends and Family: More than three times a week    Frequency of Social Gatherings with Friends and Family: More than three times a week    Attends Religious Services: Never    Marine scientist or  Organizations: No    Attends Archivist Meetings: Never    Marital Status: Never married  Stress: No Stress Concern Present (12/30/2021)   [employer] of [place] of Stress : Not at all  Tobacco Use: Medium Risk (08/10/2022)   Patient History    Smoking Tobacco Use: Former    Smokeless Tobacco Use: Never    Passive Exposure: Not on file  Transportation Needs: Unmet Transportation Needs (08/11/2022)   PRAPARE - Hydrologist (Medical): Yes    Lack of Transportation (Non-Medical): Yes    Westley Hummer, MSW, [place]  [phone]- work cell phone (preferred) [phone]- desk phone

## 2022-08-14 ENCOUNTER — Telehealth (HOSPITAL_BASED_OUTPATIENT_CLINIC_OR_DEPARTMENT_OTHER): Payer: Self-pay | Admitting: Nurse Practitioner

## 2022-08-14 ENCOUNTER — Other Ambulatory Visit: Payer: Self-pay

## 2022-08-14 ENCOUNTER — Telehealth: Payer: Self-pay | Admitting: Internal Medicine

## 2022-08-14 LAB — BASIC METABOLIC PANEL
BUN/Creatinine Ratio: 16 (ref 12–28)
BUN: 24 mg/dL (ref 8–27)
CO2: 32 mmol/L — ABNORMAL HIGH (ref 20–29)
Calcium: 9.5 mg/dL (ref 8.7–10.3)
Chloride: 86 mmol/L — ABNORMAL LOW (ref 96–106)
Creatinine, Ser: 1.49 mg/dL — ABNORMAL HIGH (ref 0.57–1.00)
Glucose: 135 mg/dL — ABNORMAL HIGH (ref 70–99)
Potassium: 2.9 mmol/L — CL (ref 3.5–5.2)
Sodium: 140 mmol/L (ref 134–144)
eGFR: 39 mL/min/{1.73_m2} — ABNORMAL LOW (ref >59)

## 2022-08-14 MED ORDER — SPIRONOLACTONE 25 MG PO TABS: 12.5000 mg | ORAL_TABLET | Freq: Every day | ORAL | 1 refills | Status: DC

## 2022-08-14 MED ORDER — POTASSIUM CHLORIDE CRYS ER 20 MEQ PO TBCR: 80.0000 meq | EXTENDED_RELEASE_TABLET | Freq: Every day | ORAL | 1 refills | Status: DC

## 2022-08-14 NOTE — Telephone Encounter (Signed)
Thanks for your input Braxton. I've looped in [place], who I'm working with today and have got this covered. Thanks again!

## 2022-08-14 NOTE — Telephone Encounter (Signed)
Maria [name] called stating potassium is 2.9   She was already told to hold metoalzone dose and has K supplements. Tried contacting her. Will follow up again in AM.

## 2022-08-14 NOTE — Telephone Encounter (Signed)
Need further potassium supplement adjustment. Probably need to increase KCl by extra 46mq daily on top of the 836m daily more permanently. Also need to clarify to metolazone dose, was metolazone completed discontinued or was it cut back to once weekly dosing instead of twice weekly dosing. Need repeat BMET lab next week.

## 2022-08-14 NOTE — Telephone Encounter (Signed)
S/W and updated her at 8:00 this morning regarding her potassium level of 2.9 and kidney function that improved. Told her to start Spironolactone 12.'5mg'$  QD and reduce Metolazone to once per week on Tuesdays. Notified her to increase Potassium to 72mq BID x 3 days then return to 864m daily. Will get a BMP next Monday for monitoring. Notified nursing staff to send a refill of her Spironolactone to her [facility] is on file. She verbalized understanding of my instructions and was very appreciative of my call.     ElFinis BudAGNP-C

## 2022-08-18 NOTE — Telephone Encounter (Signed)
Late entry   Labs were address see other notation

## 2022-08-20 ENCOUNTER — Telehealth: Payer: Self-pay | Admitting: Cardiology

## 2022-08-20 DIAGNOSIS — Z79899 Other long term (current) drug therapy: Secondary | ICD-10-CM

## 2022-08-20 NOTE — Telephone Encounter (Signed)
  Pt is calling back to f/u. She said [facility] is coming tomorrow at 9:30 am. She wanted to make sure Dr. Lolly Mustache will send the lab request today

## 2022-08-20 NOTE — Telephone Encounter (Signed)
Pt is calling to see if the labs requested recently could be done by [facility], since they already come to her house. She states that this would be more convenient due to lack of transportation. Please advise.

## 2022-08-21 ENCOUNTER — Telehealth: Payer: Self-pay | Admitting: Cardiology

## 2022-08-21 DIAGNOSIS — E876 Hypokalemia: Secondary | ICD-10-CM

## 2022-08-21 NOTE — Telephone Encounter (Signed)
  [facility] calling, she said they have lab results and pt's potassium is at 3.1 and she will fax the rest of the result

## 2022-08-21 NOTE — Telephone Encounter (Signed)
Routed to E. Arlington Calix NP

## 2022-08-21 NOTE — Telephone Encounter (Signed)
Called patient's PCP practice [phone]) and was able to confirm that she uses [facility]. They advised to call this agency and have them do STAT BMET today.   [place] - phone: [phone] -- fax: [phone]  Provided V/O for STAT BMET to operator who will call patient's nurse  Paper copy of the order faxed  [facility] will fax the results to our office  Called E. [place] Calix NP w/update

## 2022-08-23 ENCOUNTER — Telehealth: Payer: Self-pay | Admitting: Nurse Practitioner

## 2022-08-23 NOTE — Telephone Encounter (Signed)
S/w patient and updated her regarding potassium level of 3.1.  Discussed with her to increase potassium to 80 mill equivalents twice daily x2 days, then return to 31mq daily, hold metolazone dose due Tuesday, and continue spironolactone as scheduled.  Discussed with her we will take blood work (BMET) scheduled for this Friday (9/8) with [facility] as last time.  Says she is doing well from a cardiac perspective, she has lost 5 pounds.  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  This is what limits her mobility and limits her ability of coming into the office, which is being managed by her PCP. She verbalizes understanding of our conversation and she was appreciative of my call.   EFinis Bud NP

## 2022-08-25 NOTE — Telephone Encounter (Signed)
Finis Bud, NP  Fidel Levy, RN Caller: Unspecified (4 days ago,  4:54 PM) I have notified patient via MyChart about the updated potassium level.   I have given patient instructions via MyChart to increase potassium to 80 mEq BID X 2 days, hold metolazone dose due Tuesday, and continue daily spironolactone. She will then return to 80 mEq of potassium daily thereafter. Let's repeat a BMET for this Thursday before she takes her Friday dose of Metolazone.   We will need to arrange [facility] to come back next Thursday (9/7) to repeat this blood draw. Can you please reach out to them to have this arranged? I can sign an order and we can fax this over to them like last time?.   If this next level comes back low, may need to consider permanently increasing her potassium dosing. But I want to wait to see what this next level shows.    Thanks Eliezer Lofts, you have been so awesome to work with on this! When I told the patient about the update on Friday, she was so appreciative! Thankful for your help with this!   Take Care,  Finis Bud, NP

## 2022-08-25 NOTE — Telephone Encounter (Signed)
BMET ordered. V/O provided via secure VM to [employer]. [place] Patent attorney) - phone [phone]. Faxed order to [phone]

## 2022-08-26 ENCOUNTER — Telehealth: Payer: Self-pay | Admitting: Licensed Clinical Social Worker

## 2022-08-26 NOTE — Telephone Encounter (Signed)
H&V Care Navigation CSW Progress Note  Clinical Social Worker contacted patient by phone to f/u with her regarding her approval for [place] [facility] transportation. Pt answered at [phone]- she was informed she was approved, can start making reservations. I provided her with reservation number [phone], encouraged her to call and speak with them about whether she was approved for door to door/curb to curb and next steps for scheduling rides. Pt appreciative of update. Will contact me with any hiccups. I remain available.   Patient is participating in a Managed Medicaid Plan: No, UHC Medicare only.   SDOH Screenings   Food Insecurity: No Food Insecurity (04/03/2022)  Housing: Low Risk  (04/03/2022)  Transportation Needs: Unmet Transportation Needs (08/11/2022)  Alcohol Screen: Low Risk  (12/30/2021)  Depression (PHQ2-9): Medium Risk (01/30/2022)  Financial Resource Strain: Medium Risk (12/30/2021)  Physical Activity: Inactive (12/30/2021)  Social Connections: Socially Isolated (12/30/2021)  Stress: No Stress Concern Present (12/30/2021)  Tobacco Use: Medium Risk (08/10/2022)   Westley Hummer, MSW, LCSW Clinical Social Worker [place]  [phone]- work cell phone (preferred) [phone]- desk phone

## 2022-08-27 ENCOUNTER — Telehealth: Payer: Self-pay | Admitting: Cardiology

## 2022-08-27 NOTE — Telephone Encounter (Signed)
Returned call to patient, she reports [facility] RN is coming tomorrow and was suppose to do lab work.  She states RN does not have order.   Lesle Reek RN at [phone] to discuss.   He does not have order.   Request order be faxed to [phone].     Order refaxed.    Called [facility] at [phone] to confirm received.   Spoke to nurse-will confirm with the correct branch and they will call back tomorrow morning with any questions or if order not received.

## 2022-08-27 NOTE — Telephone Encounter (Signed)
Pt is calling back in regards to phone note on 09/03. She states that [place] appt was never set up and would like to discuss this. Please advise.

## 2022-08-28 NOTE — Telephone Encounter (Signed)
T[facility], she is calling for verbal lab order. She states that she did not receive faxed lab order but, she can take verbal.she will have Lab drawn.

## 2022-09-01 ENCOUNTER — Telehealth: Payer: Self-pay | Admitting: Cardiology

## 2022-09-01 DIAGNOSIS — E876 Hypokalemia: Secondary | ICD-10-CM

## 2022-09-01 MED ORDER — TORSEMIDE 20 MG PO TABS: 60.0000 mg | ORAL_TABLET | Freq: Two times a day (BID) | ORAL | 3 refills | Status: DC

## 2022-09-01 MED ORDER — POTASSIUM CHLORIDE CRYS ER 20 MEQ PO TBCR: 80.0000 meq | EXTENDED_RELEASE_TABLET | Freq: Two times a day (BID) | ORAL | 1 refills | Status: DC

## 2022-09-01 NOTE — Telephone Encounter (Signed)
Spoke with pt, she reports labs were drawn by [facility] Friday last week. Left message for TEE at [facility] asking for lab work to be faxed to our office.

## 2022-09-01 NOTE — Telephone Encounter (Signed)
Spoke with pt, per dr hochrein patient will take 80 meq of potassium twice daily. Will get labs in one week.

## 2022-09-01 NOTE — Telephone Encounter (Signed)
    Patient calling for lab results 

## 2022-09-01 NOTE — Telephone Encounter (Signed)
Spoke with pt, recent labs from 08/21/22 show potassium of 3.1. she is currently taking 80 meq in the morning and 40 meq in the evening. Aware will discuss with dr hochrein and call her back.

## 2022-09-03 NOTE — Telephone Encounter (Signed)
Lab orders faxed to [facility].

## 2022-09-28 ENCOUNTER — Telehealth: Payer: Medicare Other | Admitting: Cardiology

## 2022-11-02 ENCOUNTER — Other Ambulatory Visit: Payer: Self-pay | Admitting: Internal Medicine

## 2022-11-10 ENCOUNTER — Ambulatory Visit: Payer: Medicare Other | Admitting: Cardiology

## 2022-11-26 ENCOUNTER — Ambulatory Visit: Payer: Medicare Other | Admitting: Cardiology

## 2022-12-09 ENCOUNTER — Other Ambulatory Visit: Payer: Self-pay

## 2022-12-09 ENCOUNTER — Encounter (HOSPITAL_COMMUNITY): Payer: Self-pay | Admitting: Emergency Medicine

## 2022-12-09 ENCOUNTER — Other Ambulatory Visit: Payer: Self-pay | Admitting: Internal Medicine

## 2022-12-09 ENCOUNTER — Emergency Department (HOSPITAL_COMMUNITY)
Admission: EM | Admit: 2022-12-09 | Discharge: 2022-12-10 | Disposition: A | Discharge status: Home / Self Care | Payer: Medicare Other | Attending: Emergency Medicine | Admitting: Emergency Medicine

## 2022-12-09 ENCOUNTER — Emergency Department (HOSPITAL_COMMUNITY): Payer: Medicare Other

## 2022-12-09 DIAGNOSIS — R600 Localized edema: Secondary | ICD-10-CM | POA: Diagnosis not present

## 2022-12-09 DIAGNOSIS — K439 Ventral hernia without obstruction or gangrene: Secondary | ICD-10-CM | POA: Diagnosis not present

## 2022-12-09 DIAGNOSIS — Z91040 Latex allergy status: Secondary | ICD-10-CM | POA: Diagnosis not present

## 2022-12-09 DIAGNOSIS — R609 Edema, unspecified: Secondary | ICD-10-CM

## 2022-12-09 DIAGNOSIS — R109 Unspecified abdominal pain: Secondary | ICD-10-CM | POA: Diagnosis present

## 2022-12-09 DIAGNOSIS — R1013 Epigastric pain: Secondary | ICD-10-CM

## 2022-12-09 LAB — URINALYSIS, ROUTINE W REFLEX MICROSCOPIC
Bilirubin Urine: NEGATIVE
Glucose, UA: 150 mg/dL — AB
Hgb urine dipstick: NEGATIVE
Ketones, ur: NEGATIVE mg/dL
Nitrite: NEGATIVE
Protein, ur: NEGATIVE mg/dL
Specific Gravity, Urine: 1.008 (ref 1.005–1.030)
pH: 6 (ref 5.0–8.0)

## 2022-12-09 LAB — COMPREHENSIVE METABOLIC PANEL
ALT: 16 U/L (ref 0–44)
AST: 19 U/L (ref 15–41)
Albumin: 3.7 g/dL (ref 3.5–5.0)
Alkaline Phosphatase: 74 U/L (ref 38–126)
Anion gap: 11 (ref 5–15)
BUN: 18 mg/dL (ref 8–23)
CO2: 31 mmol/L (ref 22–32)
Calcium: 8.9 mg/dL (ref 8.9–10.3)
Chloride: 95 mmol/L — ABNORMAL LOW (ref 98–111)
Creatinine, Ser: 1.16 mg/dL — ABNORMAL HIGH (ref 0.44–1.00)
GFR, Estimated: 52 mL/min — ABNORMAL LOW (ref >60)
Glucose, Bld: 112 mg/dL — ABNORMAL HIGH (ref 70–99)
Potassium: 3.4 mmol/L — ABNORMAL LOW (ref 3.5–5.1)
Sodium: 137 mmol/L (ref 135–145)
Total Bilirubin: 1.1 mg/dL (ref 0.3–1.2)
Total Protein: 7.4 g/dL (ref 6.5–8.1)

## 2022-12-09 LAB — CBC
HCT: 37.4 % (ref 36.0–46.0)
Hemoglobin: 11.9 g/dL — ABNORMAL LOW (ref 12.0–15.0)
MCH: 28.5 pg (ref 26.0–34.0)
MCHC: 31.8 g/dL (ref 30.0–36.0)
MCV: 89.5 fL (ref 80.0–100.0)
Platelets: 278 10*3/uL (ref 150–400)
RBC: 4.18 MIL/uL (ref 3.87–5.11)
RDW: 14.6 % (ref 11.5–15.5)
WBC: 10.4 10*3/uL (ref 4.0–10.5)
nRBC: 0 % (ref 0.0–0.2)

## 2022-12-09 LAB — LACTIC ACID, PLASMA: Lactic Acid, Venous: 1.8 mmol/L (ref 0.5–1.9)

## 2022-12-09 LAB — LIPASE, BLOOD: Lipase: 40 U/L (ref 11–51)

## 2022-12-09 MED ORDER — FENTANYL CITRATE PF 50 MCG/ML IJ SOSY
50.0000 ug | PREFILLED_SYRINGE | Freq: Once | INTRAMUSCULAR | Status: AC
  Administered 2022-12-09: 50 ug via INTRAVENOUS
  Filled 2022-12-09: qty 1

## 2022-12-09 MED ORDER — ONDANSETRON 4 MG PO TBDP: 4.0000 mg | ORAL_TABLET | Freq: Once | ORAL | Status: DC | PRN

## 2022-12-09 MED ORDER — SODIUM CHLORIDE 0.9 % IV BOLUS
500.0000 mL | Freq: Once | INTRAVENOUS | Status: AC
  Administered 2022-12-09: 500 mL via INTRAVENOUS

## 2022-12-09 MED ORDER — IOHEXOL 300 MG/ML  SOLN
100.0000 mL | Freq: Once | INTRAMUSCULAR | Status: AC | PRN
  Administered 2022-12-09: 100 mL via INTRAVENOUS

## 2022-12-09 MED ORDER — ONDANSETRON HCL 4 MG/2ML IJ SOLN
4.0000 mg | Freq: Once | INTRAMUSCULAR | Status: AC
  Administered 2022-12-09: 4 mg via INTRAVENOUS
  Filled 2022-12-09: qty 2

## 2022-12-09 NOTE — Discharge Instructions (Addendum)
You are seen in the emergency department for worsening abdominal pain.  You had lab work urinalysis and CAT scan that did not show an obvious explanation for your symptoms.  Please continue regular pain medication and follow-up with your primary care doctor.  We are also giving you the contact information for her surgical team.  Putting you on some antibiotics for the redness in your legs.  Please try to elevate.  Follow-up with your primary care doctor.  Return to the emergency department if any worsening or concerning symptoms

## 2022-12-09 NOTE — ED Provider Notes (Signed)
[facility] DEPT Provider Note   CSN: [id number] Arrival date & time: 12/09/22  2057     History {Add pertinent medical, surgical, social history, OB history to HPI:1} Chief Complaint  Patient presents with   Abdominal Pain    Hernia    Maria [name] is a 66 y.o. female.  She is here with worsening central abdominal pain that started a few days ago.  She has a ventral hernia there and she said it has been giving her troubles for years on and off but has been worse for the last few days.  She has had nausea but no vomiting.  Had initially some loose stools but now they are formed.  Nonbloody.  No fevers or chills.  She has chronic swelling of her lower extremities she said that been a little redder.  She called her primary care doctor who said she should come to the emergency department for further evaluation.  The history is provided by the patient.  Abdominal Pain Pain location:  Epigastric Pain quality: aching   Pain severity:  Severe Onset quality:  Gradual Duration:  2 days Progression:  Unchanged Chronicity:  Recurrent Context: not trauma   Relieved by:  None tried Worsened by:  Eating Ineffective treatments:  None tried Associated symptoms: nausea   Associated symptoms: no chest pain, no constipation, no cough, no dysuria, no fever, no hematemesis, no hematochezia, no hematuria and no vomiting        Home Medications Prior to Admission medications   Medication Sig Start Date End Date Taking? Authorizing Provider  albuterol (PROAIR HFA) 108 (90 Base) MCG/ACT inhaler Inhale 2 puffs into the lungs every 6 (six) hours as needed for wheezing or shortness of breath. 11/05/20   Lucretia Kern, DO  Blood Glucose Monitoring Suppl (ACCU-CHEK GUIDE) w/Device KIT Use as directed Patient taking differently: 1 each by Other route as directed. 04/11/21   Elodia Florence., MD  cholecalciferol (VITAMIN D3) 25 MCG (1000 UNIT) tablet Take 2,000 Units  by mouth daily.    [provider]  citalopram (CELEXA) 20 MG tablet Take 1 tablet (20 mg total) by mouth daily. 11/02/22   Biagio Borg, MD  clonazePAM (KLONOPIN) 1 MG tablet Take 1 tablet (1 mg total) by mouth 2 (two) times daily as needed for anxiety. for anxiety 12/09/22   Biagio Borg, MD  cyclobenzaprine (FLEXERIL) 10 MG tablet TAKE 1 TABLET BY MOUTH 2 TIMES A DAY AS NEEDED FOR MUSCLE SPASMS Patient taking differently: Take 10 mg by mouth daily as needed. TAKE 1 TABLET BY MOUTH 2 TIMES A DAY AS NEEDED FOR MUSCLE SPASMS 02/27/21   Biagio Borg, MD  FARXIGA 5 MG TABS tablet Take 5 mg by mouth daily. 07/06/22   [provider]  glucose blood (ACCU-CHEK GUIDE) test strip Use as instructed up to 4 times daily 04/11/21   Elodia Florence., MD  Metamucil Fiber CHEW Chew 3 tablets by mouth at bedtime.    [provider]  metolazone (ZAROXOLYN) 5 MG tablet Take 5 mg by mouth 2 (two) times a week. Per nephrologist on Tuesday and Friday    Claudia Desanctis, MD  metoprolol tartrate (LOPRESSOR) 25 MG tablet Take 25 mg by mouth 2 (two) times daily.    [provider]  NARCAN 4 MG/0.1ML LIQD nasal spray kit Place 0.4 mg into the nose daily as needed (opioid overdose). 04/25/19   [provider]  ondansetron [facility]  ODT) 4 MG disintegrating tablet Take 1 tablet (4 mg total) by mouth every 4 (four) hours as needed for nausea or vomiting. 08/23/19   Charlesetta Shanks, MD  Oxycodone HCl 10 MG TABS Take 1 tablet (10 mg total) 4 (four) times daily by mouth. Per [facility] Patient taking differently: Take 10 mg by mouth every 4 (four) hours as needed (pain). Per [facility] 11/08/17   Biagio Borg, MD  pantoprazole (PROTONIX) 40 MG tablet TAKE ONE TABLET BY MOUTH DAILY Patient not taking: Reported on 08/10/2022 01/29/22   Biagio Borg, MD  Polyethyl Glycol-Propyl Glycol 0.4-0.3 % SOLN Apply 1 drop to eye daily as needed (dry eyes).    [provider]  potassium chloride SA (KLOR-CON M) 20 MEQ tablet Take 4 tablets (80 mEq total) by mouth 2 (two) times daily. 09/01/22   Minus Breeding, MD  pramipexole (MIRAPEX) 0.5 MG tablet Take 1 tablet (0.5 mg total) by mouth 3 (three) times daily. 06/24/22   Biagio Borg, MD  spironolactone (ALDACTONE) 25 MG tablet Take 0.5 tablets (12.5 mg total) by mouth daily. 08/14/22   Finis Bud, NP  torsemide (DEMADEX) 20 MG tablet Take 3 tablets (60 mg total) by mouth 2 (two) times daily. 09/01/22   Minus Breeding, MD  DULoxetine (CYMBALTA) 60 MG capsule Take 60 mg by mouth daily.    03/09/12  [provider]      Allergies    Amoxicillin-pot clavulanate, Adhesive [tape], Codeine, Crestor [rosuvastatin calcium], Morphine and related, Vicodin [hydrocodone-acetaminophen], and Latex    Review of Systems   Review of Systems  Constitutional:  Negative for fever.  Respiratory:  Negative for cough.   Cardiovascular:  Positive for leg swelling. Negative for chest pain.  Gastrointestinal:  Positive for abdominal pain and nausea. Negative for constipation, hematemesis, hematochezia and vomiting.  Genitourinary:  Negative for dysuria and hematuria.  Neurological:  Positive for headaches.    Physical Exam Updated Vital Signs BP (!) 143/62 (BP Location: Left Arm) Comment (BP Location): forearm  Pulse 67   Temp 97.7 F (36.5 C) (Oral)   Resp 18   SpO2 99%  Physical Exam Vitals and nursing note reviewed.  Constitutional:      General: She is not in acute distress.    Appearance: She is well-developed. She is obese.  HENT:     Head: Normocephalic and atraumatic.  Eyes:     Conjunctiva/sclera: Conjunctivae normal.  Cardiovascular:     Rate and Rhythm: Normal rate and regular rhythm.     Heart sounds: No murmur heard. Pulmonary:     Effort: Pulmonary effort is normal. No respiratory distress.     Breath sounds: Normal breath sounds.  Abdominal:     Palpations: Abdomen is soft.     Tenderness:  There is abdominal tenderness in the epigastric area. There is no guarding or rebound.     Hernia: A hernia is present. Hernia is present in the ventral area.  Musculoskeletal:        General: Tenderness present.     Cervical back: Neck supple.     Right lower leg: Edema present.     Left lower leg: Edema present.     Comments: Both of her lower extremities are quite swollen and there are some erythema of her lower legs bilaterally.  No open wounds appreciated  Skin:    General: Skin is warm and dry.     Capillary Refill: Capillary refill takes less than  2 seconds.  Neurological:     General: No focal deficit present.     Mental Status: She is alert.  Psychiatric:        Mood and Affect: Mood normal.     ED Results / Procedures / Treatments   Labs (all labs ordered are listed, but only abnormal results are displayed) Labs Reviewed  LIPASE, BLOOD  COMPREHENSIVE METABOLIC PANEL  CBC  URINALYSIS, ROUTINE W REFLEX MICROSCOPIC    EKG None  Radiology No results found.  Procedures Procedures  {Document cardiac monitor, telemetry assessment procedure when appropriate:1}  Medications Ordered in ED Medications  fentaNYL (SUBLIMAZE) injection 50 mcg (has no administration in time range)  ondansetron (ZOFRAN) injection 4 mg (has no administration in time range)  sodium chloride 0.9 % bolus 500 mL (has no administration in time range)    ED Course/ Medical Decision Making/ A&P                           Medical Decision Making Amount and/or Complexity of Data Reviewed Labs: ordered.  Risk Prescription drug management.   This patient complains of ***; this involves an extensive number of treatment Options and is a complaint that carries with it a high risk of complications and morbidity. The differential includes ***  I ordered, reviewed and interpreted labs, which included *** I ordered medication *** and reviewed PMP when indicated. I ordered imaging studies which  included *** and I independently    visualized and interpreted imaging which showed *** Additional history obtained from *** Previous records obtained and reviewed *** I consulted *** and discussed lab and imaging findings and discussed disposition.  Cardiac monitoring reviewed, *** Social determinants considered, *** Critical Interventions: ***  After the interventions stated above, I reevaluated the patient and found *** Admission and further testing considered, ***   {Document critical care time when appropriate:1} {Document review of labs and clinical decision tools ie heart score, Chads2Vasc2 etc:1}  {Document your independent review of radiology images, and any outside records:1} {Document your discussion with family members, caretakers, and with consultants:1} {Document social determinants of health affecting pt's care:1} {Document your decision making why or why not admission, treatments were needed:1} Final Clinical Impression(s) / ED Diagnoses Final diagnoses:  None    Rx / DC Orders ED Discharge Orders     None

## 2022-12-09 NOTE — ED Triage Notes (Signed)
Pt arriving via [facility] from home with abdominal pain due to hernia. Dx with this hernia in approx 2006. Pt has noticed hernia growing and increased pain over the last few days.

## 2022-12-09 NOTE — ED Provider Triage Note (Signed)
Emergency Medicine Provider Triage Evaluation Note  Maria [name] , a 66 y.o. female  was evaluated in triage.  Pt complains of abdominal pain, nausea, diarrhea, abdominal swelling with worsening size of abdominal hernia.  Review of Systems  Positive: Abdominal pain, nausea, diarrhea Negative: Fever, constipation, emesis  Physical Exam  BP (!) 143/62 (BP Location: Left Arm) Comment (BP Location): forearm  Pulse 67   Temp 97.7 F (36.5 C) (Oral)   Resp 18   SpO2 99%  Gen:   Awake, no distress   Resp:  Normal effort  MSK:   Moves extremities without difficulty  Other:  Ttp throughout abdomen with large ventral hernia, no rebound, rigidity, guarding, no obvious incarceration or strangulation  Medical Decision Making  Medically screening exam initiated at 9:35 PM.  Appropriate orders placed.  Maria [name] was informed that the remainder of the evaluation will be completed by another provider, this initial triage assessment does not replace that evaluation, and the importance of remaining in the ED until their evaluation is complete.  Workup initiated   Anselmo Pickler, [place] 12/09/22 2142

## 2022-12-10 MED ORDER — DOXYCYCLINE HYCLATE 100 MG PO CAPS: 100.0000 mg | ORAL_CAPSULE | Freq: Two times a day (BID) | ORAL | 0 refills | Status: DC

## 2022-12-16 ENCOUNTER — Other Ambulatory Visit: Payer: Self-pay | Admitting: Internal Medicine

## 2023-01-15 ENCOUNTER — Other Ambulatory Visit: Payer: Self-pay | Admitting: Internal Medicine

## 2023-01-23 ENCOUNTER — Emergency Department (HOSPITAL_COMMUNITY): Payer: 59

## 2023-01-23 ENCOUNTER — Other Ambulatory Visit: Payer: Self-pay

## 2023-01-23 ENCOUNTER — Emergency Department (HOSPITAL_COMMUNITY)
Admission: EM | Admit: 2023-01-23 | Discharge: 2023-01-24 | Disposition: A | Discharge status: Home / Self Care | Payer: 59 | Attending: Emergency Medicine | Admitting: Emergency Medicine

## 2023-01-23 DIAGNOSIS — E876 Hypokalemia: Secondary | ICD-10-CM | POA: Insufficient documentation

## 2023-01-23 DIAGNOSIS — Z91040 Latex allergy status: Secondary | ICD-10-CM | POA: Diagnosis not present

## 2023-01-23 DIAGNOSIS — I509 Heart failure, unspecified: Secondary | ICD-10-CM | POA: Insufficient documentation

## 2023-01-23 DIAGNOSIS — M25561 Pain in right knee: Secondary | ICD-10-CM | POA: Insufficient documentation

## 2023-01-23 DIAGNOSIS — S0081XA Abrasion of other part of head, initial encounter: Secondary | ICD-10-CM | POA: Diagnosis present

## 2023-01-23 DIAGNOSIS — W19XXXA Unspecified fall, initial encounter: Secondary | ICD-10-CM

## 2023-01-23 DIAGNOSIS — W0110XA Fall on same level from slipping, tripping and stumbling with subsequent striking against unspecified object, initial encounter: Secondary | ICD-10-CM | POA: Diagnosis not present

## 2023-01-23 DIAGNOSIS — S0990XA Unspecified injury of head, initial encounter: Secondary | ICD-10-CM | POA: Diagnosis not present

## 2023-01-23 DIAGNOSIS — N390 Urinary tract infection, site not specified: Secondary | ICD-10-CM | POA: Diagnosis not present

## 2023-01-23 DIAGNOSIS — M25562 Pain in left knee: Secondary | ICD-10-CM | POA: Diagnosis not present

## 2023-01-23 DIAGNOSIS — E119 Type 2 diabetes mellitus without complications: Secondary | ICD-10-CM | POA: Insufficient documentation

## 2023-01-23 DIAGNOSIS — D72829 Elevated white blood cell count, unspecified: Secondary | ICD-10-CM | POA: Diagnosis not present

## 2023-01-23 LAB — URINALYSIS, W/ REFLEX TO CULTURE (INFECTION SUSPECTED)
Bilirubin Urine: NEGATIVE
Glucose, UA: >=500 mg/dL — AB
Hgb urine dipstick: NEGATIVE
Ketones, ur: NEGATIVE mg/dL
Nitrite: POSITIVE — AB
Protein, ur: NEGATIVE mg/dL
Specific Gravity, Urine: 1.007 (ref 1.005–1.030)
pH: 6 (ref 5.0–8.0)

## 2023-01-23 LAB — CBC
HCT: 39 % (ref 36.0–46.0)
Hemoglobin: 12.5 g/dL (ref 12.0–15.0)
MCH: 28.9 pg (ref 26.0–34.0)
MCHC: 32.1 g/dL (ref 30.0–36.0)
MCV: 90.3 fL (ref 80.0–100.0)
Platelets: 254 10*3/uL (ref 150–400)
RBC: 4.32 MIL/uL (ref 3.87–5.11)
RDW: 14.4 % (ref 11.5–15.5)
WBC: 10.8 10*3/uL — ABNORMAL HIGH (ref 4.0–10.5)
nRBC: 0 % (ref 0.0–0.2)

## 2023-01-23 LAB — BASIC METABOLIC PANEL
Anion gap: 14 (ref 5–15)
BUN: 26 mg/dL — ABNORMAL HIGH (ref 8–23)
CO2: 32 mmol/L (ref 22–32)
Calcium: 8.5 mg/dL — ABNORMAL LOW (ref 8.9–10.3)
Chloride: 91 mmol/L — ABNORMAL LOW (ref 98–111)
Creatinine, Ser: 1.24 mg/dL — ABNORMAL HIGH (ref 0.44–1.00)
GFR, Estimated: 48 mL/min — ABNORMAL LOW (ref >60)
Glucose, Bld: 151 mg/dL — ABNORMAL HIGH (ref 70–99)
Potassium: 3 mmol/L — ABNORMAL LOW (ref 3.5–5.1)
Sodium: 137 mmol/L (ref 135–145)

## 2023-01-23 LAB — URINALYSIS, ROUTINE W REFLEX MICROSCOPIC
Bilirubin Urine: NEGATIVE
Glucose, UA: >=500 mg/dL — AB
Hgb urine dipstick: NEGATIVE
Ketones, ur: NEGATIVE mg/dL
Nitrite: POSITIVE — AB
Protein, ur: NEGATIVE mg/dL
Specific Gravity, Urine: 1.007 (ref 1.005–1.030)
pH: 6 (ref 5.0–8.0)

## 2023-01-23 LAB — CK: Total CK: 53 U/L (ref 38–234)

## 2023-01-23 MED ORDER — CEFDINIR 300 MG PO CAPS
300.0000 mg | ORAL_CAPSULE | Freq: Two times a day (BID) | ORAL | Status: DC
  Filled 2023-01-23: qty 1

## 2023-01-23 MED ORDER — POTASSIUM CHLORIDE CRYS ER 20 MEQ PO TBCR
40.0000 meq | EXTENDED_RELEASE_TABLET | Freq: Once | ORAL | Status: AC
  Administered 2023-01-23: 40 meq via ORAL
  Filled 2023-01-23: qty 2

## 2023-01-23 MED ORDER — FENTANYL CITRATE PF 50 MCG/ML IJ SOSY
50.0000 ug | PREFILLED_SYRINGE | Freq: Once | INTRAMUSCULAR | Status: AC
  Administered 2023-01-23: 50 ug via INTRAVENOUS
  Filled 2023-01-23: qty 1

## 2023-01-23 MED ORDER — SODIUM CHLORIDE 0.9 % IV SOLN
1.0000 g | Freq: Once | INTRAVENOUS | Status: AC
  Administered 2023-01-23: 1 g via INTRAVENOUS
  Filled 2023-01-23: qty 10

## 2023-01-23 MED ORDER — CEFDINIR 300 MG PO CAPS: 300.0000 mg | ORAL_CAPSULE | Freq: Two times a day (BID) | ORAL | 0 refills | Status: DC

## 2023-01-23 NOTE — Discharge Instructions (Signed)
Return to ED with any new or worsening signs or symptoms Please follow-up with your PCP regarding UTI Please begin taking cefdinir twice daily for the next 7 days beginning on Monday.  You have received IV antibiotics here in the department which will give you 24 hours of coverage. Please read the attached guide concerning fall prevention in the home

## 2023-01-23 NOTE — ED Notes (Signed)
Patient has a urine culture in the main lab 

## 2023-01-23 NOTE — ED Provider Notes (Signed)
[facility] Provider Note   CSN: [id number] Arrival date & time: 01/23/23  3016     History  Chief Complaint  Patient presents with   Fall   Knee Pain   Hip Pain    Modean Mccullum Burek is a 67 y.o. female with medical history of CHF, diabetes, hepatitis, diverticulosis, GERD, cellulitis.  Patient presents to ED for evaluation of fall.  Patient reports that prior to arrival she had ground-level fall.  Patient reports that she attempted to turn to go to another room however slipped because her feet slipped from underneath her.  Patient reports she landed on both of her knees and then slumped over onto her left hip.  Patient reports she was on the ground for about 1 hour.  Patient denies hitting her head, losing consciousness.  Patient denies any preceding chest pain or shortness of breath prior to falling.  Patient denies nausea, vomiting, abdominal pain.  Patient denies blood thinners.   Fall Pertinent negatives include no chest pain, no abdominal pain and no shortness of breath.  Knee Pain Hip Pain Pertinent negatives include no chest pain, no abdominal pain and no shortness of breath.       Home Medications Prior to Admission medications   Medication Sig Start Date End Date Taking? Authorizing Provider  cefdinir (OMNICEF) 300 MG capsule Take 1 capsule (300 mg total) by mouth 2 (two) times daily. 01/23/23  Yes Azucena Cecil, PA-C  albuterol (PROAIR HFA) 108 (90 Base) MCG/ACT inhaler Inhale 2 puffs into the lungs every 6 (six) hours as needed for wheezing or shortness of breath. 11/05/20   Lucretia Kern, DO  Blood Glucose Monitoring Suppl (ACCU-CHEK GUIDE) w/Device KIT Use as directed Patient taking differently: 1 each by Other route as directed. 04/11/21   Elodia Florence., MD  cholecalciferol (VITAMIN D3) 25 MCG (1000 UNIT) tablet Take 2,000 Units by mouth daily.    [provider]  citalopram (CELEXA) 20 MG tablet Take  1 tablet (20 mg total) by mouth daily. 11/02/22   Biagio Borg, MD  clonazePAM (KLONOPIN) 1 MG tablet Take 1 tablet (1 mg total) by mouth 2 (two) times daily as needed for anxiety. for anxiety 12/09/22   Biagio Borg, MD  cyclobenzaprine (FLEXERIL) 10 MG tablet TAKE 1 TABLET BY MOUTH 2 TIMES A DAY AS NEEDED FOR MUSCLE SPASMS Patient taking differently: Take 10 mg by mouth 2 (two) times daily as needed for muscle spasms. 02/27/21   Biagio Borg, MD  doxycycline (VIBRAMYCIN) 100 MG capsule Take 1 capsule (100 mg total) by mouth 2 (two) times daily. 12/10/22   Hayden Rasmussen, MD  FARXIGA 5 MG TABS tablet Take 5 mg by mouth daily. 07/06/22   [provider]  fluticasone (FLONASE) 50 MCG/ACT nasal spray Place into both nostrils. 12/07/22   [provider]  glucose blood (ACCU-CHEK GUIDE) test strip Use as instructed up to 4 times daily 04/11/21   Elodia Florence., MD  Metamucil Fiber CHEW Chew 3 tablets by mouth at bedtime.    [provider]  metolazone (ZAROXOLYN) 5 MG tablet Take 5 mg by mouth as directed. Per nephrologist on Tuesday and Friday    Claudia Desanctis, MD  metoprolol tartrate (LOPRESSOR) 25 MG tablet Take 25 mg by mouth in the morning.    [provider]  NARCAN 4 MG/0.1ML LIQD nasal spray kit Place 0.4 mg into the nose daily  as needed (opioid overdose). 04/25/19   [provider]  ondansetron (ZOFRAN ODT) 4 MG disintegrating tablet Take 1 tablet (4 mg total) by mouth every 4 (four) hours as needed for nausea or vomiting. 08/23/19   Charlesetta Shanks, MD  Oxycodone HCl 10 MG TABS Take 1 tablet (10 mg total) 4 (four) times daily by mouth. Per [facility] Patient taking differently: Take 10 mg by mouth every 4 (four) hours as needed (pain). Per [facility] 11/08/17   Biagio Borg, MD  pantoprazole (PROTONIX) 40 MG tablet TAKE ONE TABLET BY MOUTH DAILY 01/29/22   Biagio Borg, MD  Polyethyl Glycol-Propyl Glycol 0.4-0.3 % SOLN  Apply 1 drop to eye daily as needed (dry eyes).    [provider]  potassium chloride SA (KLOR-CON M) 20 MEQ tablet Take 4 tablets (80 mEq total) by mouth 2 (two) times daily. Patient taking differently: Take 40 mEq by mouth 2 (two) times daily. 09/01/22   Minus Breeding, MD  pramipexole (MIRAPEX) 0.5 MG tablet Take 1 tablet (0.5 mg total) by mouth 3 (three) times daily. 06/24/22   Biagio Borg, MD  spironolactone (ALDACTONE) 25 MG tablet Take 0.5 tablets (12.5 mg total) by mouth daily. Patient taking differently: Take 25 mg by mouth daily. 08/14/22   Finis Bud, NP  torsemide (DEMADEX) 20 MG tablet Take 3 tablets (60 mg total) by mouth 2 (two) times daily. Patient taking differently: Take 60 mg by mouth in the morning. 09/01/22   Minus Breeding, MD  DULoxetine (CYMBALTA) 60 MG capsule Take 60 mg by mouth daily.    03/09/12  [provider]      Allergies    Amoxicillin-pot clavulanate, Adhesive [tape], Codeine, Crestor [rosuvastatin calcium], Morphine and related, Vicodin [hydrocodone-acetaminophen], and Latex    Review of Systems   Review of Systems  Respiratory:  Negative for shortness of breath.   Cardiovascular:  Negative for chest pain.  Gastrointestinal:  Negative for abdominal pain, nausea and vomiting.  Musculoskeletal:  Positive for arthralgias and myalgias.  Neurological:  Negative for syncope.  All other systems reviewed and are negative.   Physical Exam Updated Vital Signs BP (!) 155/59   Pulse 63   Temp 97.9 F (36.6 C) (Oral)   Resp 20   SpO2 99%  Physical Exam Vitals and nursing note reviewed.  Constitutional:      General: She is not in acute distress.    Appearance: Normal appearance. She is not ill-appearing, toxic-appearing or diaphoretic.  HENT:     Head: Normocephalic.     Comments: Superficial abrasion to forehead    Nose: Nose normal.     Mouth/Throat:     Mouth: Mucous membranes are moist.     Pharynx: Oropharynx is clear.   Eyes:     Extraocular Movements: Extraocular movements intact.     Conjunctiva/sclera: Conjunctivae normal.     Pupils: Pupils are equal, round, and reactive to light.  Cardiovascular:     Rate and Rhythm: Normal rate and regular rhythm.  Pulmonary:     Effort: Pulmonary effort is normal.     Breath sounds: Normal breath sounds. No wheezing.  Abdominal:     General: Abdomen is flat. Bowel sounds are normal.     Palpations: Abdomen is soft.     Tenderness: There is no abdominal tenderness.  Musculoskeletal:     Cervical back: Normal range of motion and neck supple. No tenderness.     Right knee: Normal range  of motion.     Left knee: Normal range of motion.     Comments: Intact range of motion bilateral knees  Skin:    General: Skin is warm and dry.     Capillary Refill: Capillary refill takes less than 2 seconds.  Neurological:     General: No focal deficit present.     Mental Status: She is alert and oriented to person, place, and time.     GCS: GCS eye subscore is 4. GCS verbal subscore is 5. GCS motor subscore is 6.     Cranial Nerves: Cranial nerves 2-12 are intact. No cranial nerve deficit.     Sensory: Sensation is intact. No sensory deficit.     Motor: Motor function is intact. No weakness.     Coordination: Coordination is intact. Heel to [facility] Test normal.     Comments: Cranial nerves II through XII intact.  Intact finger-nose, heel-to-shin.  No pronator drift, no slurred speech.  5 out of 5 strength upper and lower extremities bilaterally.     ED Results / Procedures / Treatments   Labs (all labs ordered are listed, but only abnormal results are displayed) Labs Reviewed  CBC - Abnormal; Notable for the following components:      Result Value   WBC 10.8 (*)    All other components within normal limits  BASIC METABOLIC PANEL - Abnormal; Notable for the following components:   Potassium 3.0 (*)    Chloride 91 (*)    Glucose, Bld 151 (*)    BUN 26 (*)     Creatinine, Ser 1.24 (*)    Calcium 8.5 (*)    GFR, Estimated 48 (*)    All other components within normal limits  URINALYSIS, ROUTINE W REFLEX MICROSCOPIC - Abnormal; Notable for the following components:   Glucose, UA >=500 (*)    Nitrite POSITIVE (*)    Leukocytes,Ua SMALL (*)    Bacteria, UA RARE (*)    All other components within normal limits  CK  URINALYSIS, W/ REFLEX TO CULTURE (INFECTION SUSPECTED)    EKG None  Radiology CT Head Wo Contrast  Result Date: 01/23/2023 CLINICAL DATA:  Fall EXAM: CT HEAD WITHOUT CONTRAST TECHNIQUE: Contiguous axial images were obtained from the base of the skull through the vertex without intravenous contrast. RADIATION DOSE REDUCTION: This exam was performed according to the departmental dose-optimization program which includes automated exposure control, adjustment of the mA and/or kV according to patient size and/or use of iterative reconstruction technique. COMPARISON:  None Available. FINDINGS: Brain: No acute intracranial abnormality. Specifically, no hemorrhage, hydrocephalus, mass lesion, acute infarction, or significant intracranial injury. Vascular: No hyperdense vessel or unexpected calcification. Skull: No acute calvarial abnormality. Sinuses/Orbits: No acute findings Other: None IMPRESSION: No acute intracranial abnormality. Electronically Signed   By: Rolm Baptise M.D.   On: 01/23/2023 20:08   DG Knee Complete 4 Views Right  Result Date: 01/23/2023 CLINICAL DATA:  Fall EXAM: RIGHT KNEE - COMPLETE 4+ VIEW COMPARISON:  06/20/2021 FINDINGS: No evidence of fracture, dislocation, or joint effusion. Mild osteoarthrosis. Soft tissues are unremarkable. IMPRESSION: No fracture or dislocation. Mild osteoarthrosis. Electronically Signed   By: Ulyses Jarred M.D.   On: 01/23/2023 19:43   DG Hip Unilat W or Wo Pelvis 2-3 Views Left  Result Date: 01/23/2023 CLINICAL DATA:  Status post fall with hip pain EXAM: DG HIP (WITH OR WITHOUT PELVIS) 2-3V LEFT  COMPARISON:  None Available. FINDINGS: There is no evidence of hip fracture or  dislocation. 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 Sacroiliac joints and pubic symphysis are maintained. IMPRESSION: 1. No acute fracture or dislocation. 2. Mild bilateral hip osteoarthritis. Electronically Signed   By: Keane Police D.O.   On: 01/23/2023 19:43   DG Knee Complete 4 Views Left  Result Date: 01/23/2023 CLINICAL DATA:  Status post fall, left knee pain EXAM: LEFT KNEE - COMPLETE 4+ VIEW COMPARISON:  None Available. FINDINGS: No evidence of fracture, dislocation, or joint effusion. Tricompartmental joint space narrowing with marginal osteophytes suggesting moderate osteoarthritis. Soft tissues are unremarkable. IMPRESSION: 1. No fracture or dislocation. 2. Moderate tricompartmental osteoarthritis. Electronically Signed   By: Keane Police D.O.   On: 01/23/2023 19:40    Procedures Procedures   Medications Ordered in ED Medications  cefTRIAXone (ROCEPHIN) 1 g in sodium chloride 0.9 % 100 mL IVPB (1 g Intravenous New Bag/Given 01/23/23 2206)  fentaNYL (SUBLIMAZE) injection 50 mcg (50 mcg Intravenous Given 01/23/23 1946)  potassium chloride SA (KLOR-CON M) CR tablet 40 mEq (40 mEq Oral Given 01/23/23 2027)    ED Course/ Medical Decision Making/ A&P  Medical Decision Making Amount and/or Complexity of Data Reviewed Labs: ordered. Radiology: ordered.  Risk Prescription drug management.   67 year old female presents to ED for evaluation.  Please see HPI for further details.  On examination patient afebrile and nontachycardic.  Lung sounds are clear bilaterally, she is not hypoxic on room air.  Patient abdomen soft and compressible throughout.  Neurological examination shows no focal neurodeficits.  Patient does have intact range of motion of bilateral knees, no overlying skin change, no deformity.  Patient does have superficial  abrasion to her forehead.  Pupils PERRL.  Workup will include CBC, BMP, CK, urinalysis, bilateral plain films of knee, CT head, plain film imaging of left hip.  CBC with slight leukocytosis 10.8.  CK unremarkable within normal limits.  Urinalysis shows nitrite positive urine, small leukocytes, this has been sent for culture.  Patient BMP shows decreased potassium repleted with 40 mEq oral potassium.  Patient creatinine stable at 1.2.  Plain film imaging of bilateral knees unremarkable for acute pathology.  Plain film imaging of left hip unremarkable.  CT head unremarkable, no intracranial abnormality.  Patient provided 50 mcg fentanyl for pain control.  Patient provided 1 g Rocephin for UTI due to the fact the patient reports that her pharmacy is unable to deliver her medications to her until Monday.  This will allow patient to have 24 hours of coverage and receive her medication for UTI on Monday.  Patient provided 1 g Rocephin IV for UTI.  Patient will be sent home on 7 days of cefdinir twice daily.  Patient will be advised to follow-up with her PCP for further management.  Patient provided return precautions and she voiced understanding.  Patient had all her questions answered to her satisfaction.  Patient stable for discharge.   Final Clinical Impression(s) / ED Diagnoses Final diagnoses:  Fall, initial encounter  Acute pain of both knees    Rx / DC Orders ED Discharge Orders          Ordered    cefdinir (OMNICEF) 300 MG capsule  2 times daily        01/23/23 2147              Lawana Chambers 01/23/23 2234    Elgie Congo, MD 01/23/23 2257

## 2023-01-23 NOTE — ED Notes (Signed)
[facility] called for pt. Transportation home

## 2023-01-23 NOTE — ED Triage Notes (Signed)
EMS reports from home, unwitnessed slip and fall from standing to knees and fell to strike head. C/o knee and left side hip pain. Abrasion right side of forehead. No LOC, no blood thinners.   BP 132/76 HR 64 RR 20 Sp02 98 RA CBG 189

## 2023-01-26 LAB — URINE CULTURE: Culture: >=100000 — AB

## 2023-01-27 ENCOUNTER — Ambulatory Visit: Payer: Medicare Other | Admitting: Cardiology

## 2023-01-27 ENCOUNTER — Telehealth (HOSPITAL_BASED_OUTPATIENT_CLINIC_OR_DEPARTMENT_OTHER): Payer: Self-pay

## 2023-01-27 NOTE — Progress Notes (Signed)
ED Antimicrobial Stewardship Positive Culture Follow Up   Maria [name] is an 67 y.o. female who presented to [facility] on 01/23/2023 with a chief complaint of  Chief Complaint  Patient presents with   Fall   Knee Pain   Hip Pain    Recent Results (from the past 720 hour(s))  Urine Culture     Status: Abnormal   Collection Time: 01/23/23  9:46 PM   Specimen: Urine, Random  Result Value Ref Range Status   Specimen Description   Final    URINE, RANDOM Performed at [facility] [address] [place], [place] [address]    Special Requests   Final    NONE Reflexed from [id number] Performed at [facility], [place] [address] [place], [place] [address]    Culture (A)  Final    >=100,000 COLONIES/mL KLEBSIELLA PNEUMONIAE Confirmed Extended Spectrum Beta-Lactamase Producer (ESBL).  In bloodstream infections from ESBL organisms, carbapenems are preferred over piperacillin/tazobactam. They are shown to have a lower risk of mortality.    Report Status 01/26/2023 FINAL  Final   Organism ID, Bacteria KLEBSIELLA PNEUMONIAE (A)  Final      Susceptibility   Klebsiella pneumoniae - MIC*    AMPICILLIN >=32 RESISTANT Resistant     CEFAZOLIN >=64 RESISTANT Resistant     CEFEPIME >=32 RESISTANT Resistant     CEFTRIAXONE >=64 RESISTANT Resistant     CIPROFLOXACIN 2 RESISTANT Resistant     GENTAMICIN <=1 SENSITIVE Sensitive     IMIPENEM 0.5 SENSITIVE Sensitive     NITROFURANTOIN 64 INTERMEDIATE Intermediate     TRIMETH/SULFA >=320 RESISTANT Resistant     AMPICILLIN/SULBACTAM >=32 RESISTANT Resistant     PIP/TAZO 32 INTERMEDIATE Intermediate     * >=100,000 COLONIES/mL KLEBSIELLA PNEUMONIAE    '[x]'$  Treated with cefdinir, organism resistant to prescribed antimicrobial '[]'$  Patient discharged originally without antimicrobial agent and treatment is now indicated  New antibiotic prescription: none **Patient asymptomatic, here for fall. Likely colonized given  recent history of K.pneumo in the urine (now ESBL). Do not treat given no symptoms.**  ED Provider: Quintella Baton, PharmD, BCPS Clinical Pharmacist 01/27/2023 11:30 AM

## 2023-01-27 NOTE — Telephone Encounter (Signed)
Post ED Visit - Positive Culture Follow-up  Culture report reviewed by antimicrobial stewardship pharmacist: [place] Team '[]'$  Elenor Quinones, Pharm.D. '[]'$  Heide Guile, Pharm.D., BCPS AQ-ID '[]'$  Parks Neptune, Pharm.D., BCPS '[]'$  Alycia Rossetti, Pharm.D., BCPS '[]'$  [place], Pharm.D., BCPS, AAHIVP '[]'$  Legrand Como, Pharm.D., BCPS, AAHIVP '[]'$  Salome Arnt, PharmD, BCPS '[]'$  Johnnette Gourd, PharmD, BCPS '[]'$  Hughes Better, PharmD, BCPS '[]'$  Leeroy Cha, PharmD '[]'$  Laqueta Linden, PharmD, BCPS '[]'$  Albertina Parr, PharmD  [facility] Team '[x]'$  Dorena Bodo, PharmD '[]'$  Lindell Spar, PharmD '[]'$  Royetta Asal, PharmD '[]'$  Graylin Shiver, Rph '[]'$  Rema Fendt) Glennon Mac, PharmD '[]'$  Arlyn Dunning, PharmD '[]'$  Netta Cedars, PharmD '[]'$  Dia Sitter, PharmD '[]'$  Leone Haven, PharmD '[]'$  Gretta Arab, PharmD '[]'$  Theodis Shove, PharmD '[]'$  Peggyann Juba, PharmD '[]'$  Reuel Boom, PharmD   Positive urine culture Questionable colonization urine Cx 07/2022 K. Pneumo, do not treat.  no further patient follow-up is required at this time.  Glennon Hamilton 01/27/2023, 11:36 AM

## 2023-02-02 ENCOUNTER — Other Ambulatory Visit: Payer: Self-pay

## 2023-02-02 ENCOUNTER — Emergency Department (HOSPITAL_COMMUNITY): Payer: 59

## 2023-02-02 ENCOUNTER — Encounter (HOSPITAL_COMMUNITY): Payer: Self-pay

## 2023-02-02 ENCOUNTER — Inpatient Hospital Stay (HOSPITAL_COMMUNITY)
Admission: EM | Admit: 2023-02-02 | Discharge: 2023-02-12 | DRG: 291 | Disposition: A | Discharge status: Skilled Nursing Facility | Payer: 59 | Attending: Internal Medicine | Admitting: Internal Medicine

## 2023-02-02 DIAGNOSIS — E1151 Type 2 diabetes mellitus with diabetic peripheral angiopathy without gangrene: Secondary | ICD-10-CM | POA: Diagnosis present

## 2023-02-02 DIAGNOSIS — M25512 Pain in left shoulder: Secondary | ICD-10-CM | POA: Diagnosis present

## 2023-02-02 DIAGNOSIS — J309 Allergic rhinitis, unspecified: Secondary | ICD-10-CM | POA: Diagnosis present

## 2023-02-02 DIAGNOSIS — Z880 Allergy status to penicillin: Secondary | ICD-10-CM

## 2023-02-02 DIAGNOSIS — W1830XA Fall on same level, unspecified, initial encounter: Secondary | ICD-10-CM | POA: Diagnosis present

## 2023-02-02 DIAGNOSIS — Z1152 Encounter for screening for COVID-19: Secondary | ICD-10-CM

## 2023-02-02 DIAGNOSIS — D649 Anemia, unspecified: Secondary | ICD-10-CM | POA: Diagnosis present

## 2023-02-02 DIAGNOSIS — R262 Difficulty in walking, not elsewhere classified: Secondary | ICD-10-CM | POA: Diagnosis present

## 2023-02-02 DIAGNOSIS — I700 Atherosclerosis of aorta: Secondary | ICD-10-CM | POA: Diagnosis present

## 2023-02-02 DIAGNOSIS — Z7984 Long term (current) use of oral hypoglycemic drugs: Secondary | ICD-10-CM

## 2023-02-02 DIAGNOSIS — G4733 Obstructive sleep apnea (adult) (pediatric): Secondary | ICD-10-CM | POA: Diagnosis present

## 2023-02-02 DIAGNOSIS — Z6841 Body Mass Index (BMI) 40.0 and over, adult: Secondary | ICD-10-CM

## 2023-02-02 DIAGNOSIS — I5033 Acute on chronic diastolic (congestive) heart failure: Secondary | ICD-10-CM | POA: Diagnosis present

## 2023-02-02 DIAGNOSIS — Z91040 Latex allergy status: Secondary | ICD-10-CM

## 2023-02-02 DIAGNOSIS — J9621 Acute and chronic respiratory failure with hypoxia: Secondary | ICD-10-CM

## 2023-02-02 DIAGNOSIS — N179 Acute kidney failure, unspecified: Secondary | ICD-10-CM | POA: Diagnosis not present

## 2023-02-02 DIAGNOSIS — L03115 Cellulitis of right lower limb: Secondary | ICD-10-CM | POA: Diagnosis present

## 2023-02-02 DIAGNOSIS — I50812 Chronic right heart failure: Secondary | ICD-10-CM

## 2023-02-02 DIAGNOSIS — K219 Gastro-esophageal reflux disease without esophagitis: Secondary | ICD-10-CM | POA: Diagnosis present

## 2023-02-02 DIAGNOSIS — J9601 Acute respiratory failure with hypoxia: Secondary | ICD-10-CM | POA: Diagnosis present

## 2023-02-02 DIAGNOSIS — F419 Anxiety disorder, unspecified: Secondary | ICD-10-CM | POA: Diagnosis present

## 2023-02-02 DIAGNOSIS — Z79899 Other long term (current) drug therapy: Secondary | ICD-10-CM

## 2023-02-02 DIAGNOSIS — Z90710 Acquired absence of both cervix and uterus: Secondary | ICD-10-CM

## 2023-02-02 DIAGNOSIS — I130 Hypertensive heart and chronic kidney disease with heart failure and stage 1 through stage 4 chronic kidney disease, or unspecified chronic kidney disease: Principal | ICD-10-CM | POA: Diagnosis present

## 2023-02-02 DIAGNOSIS — E869 Volume depletion, unspecified: Secondary | ICD-10-CM | POA: Diagnosis present

## 2023-02-02 DIAGNOSIS — I890 Lymphedema, not elsewhere classified: Secondary | ICD-10-CM

## 2023-02-02 DIAGNOSIS — Z885 Allergy status to narcotic agent status: Secondary | ICD-10-CM

## 2023-02-02 DIAGNOSIS — L03116 Cellulitis of left lower limb: Secondary | ICD-10-CM | POA: Diagnosis present

## 2023-02-02 DIAGNOSIS — J9622 Acute and chronic respiratory failure with hypercapnia: Secondary | ICD-10-CM | POA: Diagnosis present

## 2023-02-02 DIAGNOSIS — E785 Hyperlipidemia, unspecified: Secondary | ICD-10-CM | POA: Diagnosis present

## 2023-02-02 DIAGNOSIS — I881 Chronic lymphadenitis, except mesenteric: Secondary | ICD-10-CM | POA: Diagnosis present

## 2023-02-02 DIAGNOSIS — Z888 Allergy status to other drugs, medicaments and biological substances status: Secondary | ICD-10-CM

## 2023-02-02 DIAGNOSIS — E8770 Fluid overload, unspecified: Secondary | ICD-10-CM | POA: Diagnosis present

## 2023-02-02 DIAGNOSIS — E1122 Type 2 diabetes mellitus with diabetic chronic kidney disease: Secondary | ICD-10-CM | POA: Diagnosis present

## 2023-02-02 DIAGNOSIS — Z825 Family history of asthma and other chronic lower respiratory diseases: Secondary | ICD-10-CM

## 2023-02-02 DIAGNOSIS — L304 Erythema intertrigo: Secondary | ICD-10-CM | POA: Diagnosis present

## 2023-02-02 DIAGNOSIS — Z8614 Personal history of Methicillin resistant Staphylococcus aureus infection: Secondary | ICD-10-CM

## 2023-02-02 DIAGNOSIS — G2581 Restless legs syndrome: Secondary | ICD-10-CM | POA: Diagnosis present

## 2023-02-02 DIAGNOSIS — J189 Pneumonia, unspecified organism: Secondary | ICD-10-CM

## 2023-02-02 DIAGNOSIS — Z833 Family history of diabetes mellitus: Secondary | ICD-10-CM

## 2023-02-02 DIAGNOSIS — Z8249 Family history of ischemic heart disease and other diseases of the circulatory system: Secondary | ICD-10-CM

## 2023-02-02 DIAGNOSIS — F32A Depression, unspecified: Secondary | ICD-10-CM | POA: Diagnosis present

## 2023-02-02 DIAGNOSIS — W19XXXA Unspecified fall, initial encounter: Principal | ICD-10-CM | POA: Diagnosis present

## 2023-02-02 DIAGNOSIS — Z87891 Personal history of nicotine dependence: Secondary | ICD-10-CM

## 2023-02-02 DIAGNOSIS — Z818 Family history of other mental and behavioral disorders: Secondary | ICD-10-CM

## 2023-02-02 DIAGNOSIS — Z91048 Other nonmedicinal substance allergy status: Secondary | ICD-10-CM

## 2023-02-02 DIAGNOSIS — N1832 Chronic kidney disease, stage 3b: Secondary | ICD-10-CM | POA: Diagnosis present

## 2023-02-02 DIAGNOSIS — I872 Venous insufficiency (chronic) (peripheral): Secondary | ICD-10-CM | POA: Diagnosis present

## 2023-02-02 DIAGNOSIS — Z8619 Personal history of other infectious and parasitic diseases: Secondary | ICD-10-CM

## 2023-02-02 LAB — COMPREHENSIVE METABOLIC PANEL
ALT: 17 U/L (ref 0–44)
AST: 20 U/L (ref 15–41)
Albumin: 3.5 g/dL (ref 3.5–5.0)
Alkaline Phosphatase: 66 U/L (ref 38–126)
Anion gap: 12 (ref 5–15)
BUN: 24 mg/dL — ABNORMAL HIGH (ref 8–23)
CO2: 27 mmol/L (ref 22–32)
Calcium: 9.1 mg/dL (ref 8.9–10.3)
Chloride: 94 mmol/L — ABNORMAL LOW (ref 98–111)
Creatinine, Ser: 1.31 mg/dL — ABNORMAL HIGH (ref 0.44–1.00)
GFR, Estimated: 45 mL/min — ABNORMAL LOW (ref >60)
Glucose, Bld: 155 mg/dL — ABNORMAL HIGH (ref 70–99)
Potassium: 3.8 mmol/L (ref 3.5–5.1)
Sodium: 133 mmol/L — ABNORMAL LOW (ref 135–145)
Total Bilirubin: 1.5 mg/dL — ABNORMAL HIGH (ref 0.3–1.2)
Total Protein: 6.8 g/dL (ref 6.5–8.1)

## 2023-02-02 LAB — CBC WITH DIFFERENTIAL/PLATELET
Abs Immature Granulocytes: 0.05 10*3/uL (ref 0.00–0.07)
Basophils Absolute: 0 10*3/uL (ref 0.0–0.1)
Basophils Relative: 0 %
Eosinophils Absolute: 0.1 10*3/uL (ref 0.0–0.5)
Eosinophils Relative: 1 %
HCT: 33.7 % — ABNORMAL LOW (ref 36.0–46.0)
Hemoglobin: 11.2 g/dL — ABNORMAL LOW (ref 12.0–15.0)
Immature Granulocytes: 1 %
Lymphocytes Relative: 13 %
Lymphs Abs: 1.3 10*3/uL (ref 0.7–4.0)
MCH: 29.3 pg (ref 26.0–34.0)
MCHC: 33.2 g/dL (ref 30.0–36.0)
MCV: 88.2 fL (ref 80.0–100.0)
Monocytes Absolute: 0.7 10*3/uL (ref 0.1–1.0)
Monocytes Relative: 7 %
Neutro Abs: 8.3 10*3/uL — ABNORMAL HIGH (ref 1.7–7.7)
Neutrophils Relative %: 78 %
Platelets: 233 10*3/uL (ref 150–400)
RBC: 3.82 MIL/uL — ABNORMAL LOW (ref 3.87–5.11)
RDW: 14.6 % (ref 11.5–15.5)
WBC: 10.4 10*3/uL (ref 4.0–10.5)
nRBC: 0 % (ref 0.0–0.2)

## 2023-02-02 LAB — HEMOGLOBIN A1C
Hgb A1c MFr Bld: 6.5 % — ABNORMAL HIGH (ref 4.8–5.6)
Mean Plasma Glucose: 139.85 mg/dL

## 2023-02-02 LAB — LIPID PANEL
Cholesterol: 148 mg/dL (ref 0–200)
HDL: 41 mg/dL (ref >40)
LDL Cholesterol: 87 mg/dL (ref 0–99)
Total CHOL/HDL Ratio: 3.6 RATIO
Triglycerides: 100 mg/dL (ref <150)
VLDL: 20 mg/dL (ref 0–40)

## 2023-02-02 LAB — TSH: TSH: 2.685 u[IU]/mL (ref 0.350–4.500)

## 2023-02-02 LAB — TROPONIN I (HIGH SENSITIVITY)
Troponin I (High Sensitivity): 7 ng/L (ref <18)
Troponin I (High Sensitivity): 10 ng/L (ref <18)

## 2023-02-02 LAB — BRAIN NATRIURETIC PEPTIDE: B Natriuretic Peptide: 26.2 pg/mL (ref 0.0–100.0)

## 2023-02-02 LAB — GLUCOSE, CAPILLARY: Glucose-Capillary: 163 mg/dL — ABNORMAL HIGH (ref 70–99)

## 2023-02-02 MED ORDER — FUROSEMIDE 10 MG/ML IJ SOLN
40.0000 mg | Freq: Two times a day (BID) | INTRAMUSCULAR | Status: DC
  Administered 2023-02-02 – 2023-02-05 (×7): 40 mg via INTRAVENOUS
  Filled 2023-02-02 (×7): qty 4

## 2023-02-02 MED ORDER — HYDROCERIN EX CREA
TOPICAL_CREAM | Freq: Every day | CUTANEOUS | Status: DC
  Filled 2023-02-02 (×4): qty 113

## 2023-02-02 MED ORDER — ACETAMINOPHEN 325 MG PO TABS: 650.0000 mg | ORAL_TABLET | Freq: Four times a day (QID) | ORAL | Status: DC | PRN

## 2023-02-02 MED ORDER — PANTOPRAZOLE SODIUM 40 MG PO TBEC
40.0000 mg | DELAYED_RELEASE_TABLET | Freq: Every day | ORAL | Status: DC
  Administered 2023-02-02 – 2023-02-12 (×10): 40 mg via ORAL
  Filled 2023-02-02 (×12): qty 1

## 2023-02-02 MED ORDER — ONDANSETRON HCL 4 MG PO TABS
4.0000 mg | ORAL_TABLET | Freq: Four times a day (QID) | ORAL | Status: DC | PRN
  Filled 2023-02-02: qty 1

## 2023-02-02 MED ORDER — ACETAMINOPHEN 500 MG PO TABS
1000.0000 mg | ORAL_TABLET | Freq: Three times a day (TID) | ORAL | Status: DC
  Administered 2023-02-02 – 2023-02-12 (×30): 1000 mg via ORAL
  Filled 2023-02-02 (×30): qty 2

## 2023-02-02 MED ORDER — ENOXAPARIN SODIUM 80 MG/0.8ML IJ SOSY
80.0000 mg | PREFILLED_SYRINGE | INTRAMUSCULAR | Status: DC
  Administered 2023-02-03 – 2023-02-07 (×5): 80 mg via SUBCUTANEOUS
  Filled 2023-02-02 (×5): qty 0.8

## 2023-02-02 MED ORDER — OXYCODONE HCL 5 MG PO TABS
5.0000 mg | ORAL_TABLET | Freq: Four times a day (QID) | ORAL | Status: DC | PRN
  Administered 2023-02-02 – 2023-02-08 (×11): 5 mg via ORAL
  Filled 2023-02-02 (×11): qty 1

## 2023-02-02 MED ORDER — GABAPENTIN 300 MG PO CAPS
300.0000 mg | ORAL_CAPSULE | Freq: Once | ORAL | Status: AC
  Administered 2023-02-02: 300 mg via ORAL
  Filled 2023-02-02: qty 1

## 2023-02-02 MED ORDER — CITALOPRAM HYDROBROMIDE 20 MG PO TABS
20.0000 mg | ORAL_TABLET | Freq: Every day | ORAL | Status: DC
  Administered 2023-02-02 – 2023-02-12 (×11): 20 mg via ORAL
  Filled 2023-02-02 (×11): qty 1

## 2023-02-02 MED ORDER — ONDANSETRON HCL 4 MG/2ML IJ SOLN: 4.0000 mg | Freq: Four times a day (QID) | INTRAMUSCULAR | Status: DC | PRN

## 2023-02-02 MED ORDER — PRAMIPEXOLE DIHYDROCHLORIDE 0.25 MG PO TABS
0.5000 mg | ORAL_TABLET | Freq: Three times a day (TID) | ORAL | Status: DC
  Administered 2023-02-02 – 2023-02-12 (×30): 0.5 mg via ORAL
  Filled 2023-02-02 (×34): qty 2

## 2023-02-02 MED ORDER — HYDROMORPHONE HCL 1 MG/ML IJ SOLN
0.5000 mg | Freq: Four times a day (QID) | INTRAMUSCULAR | Status: DC | PRN
  Administered 2023-02-02 – 2023-02-06 (×9): 0.5 mg via INTRAVENOUS
  Filled 2023-02-02 (×9): qty 0.5

## 2023-02-02 MED ORDER — HYDROMORPHONE HCL 1 MG/ML IJ SOLN
1.0000 mg | Freq: Once | INTRAMUSCULAR | Status: AC
  Administered 2023-02-02: 1 mg via INTRAVENOUS
  Filled 2023-02-02: qty 1

## 2023-02-02 MED ORDER — SODIUM CHLORIDE 0.9 % IV SOLN
2.0000 g | INTRAVENOUS | Status: DC
  Administered 2023-02-02: 2 g via INTRAVENOUS
  Filled 2023-02-02: qty 20

## 2023-02-02 MED ORDER — FUROSEMIDE 8 MG/ML PO SOLN: 40.0000 mg | Freq: Once | ORAL | Status: DC

## 2023-02-02 MED ORDER — METOPROLOL TARTRATE 25 MG PO TABS
25.0000 mg | ORAL_TABLET | Freq: Every day | ORAL | Status: DC
  Administered 2023-02-05 – 2023-02-12 (×8): 25 mg via ORAL
  Filled 2023-02-02 (×9): qty 1

## 2023-02-02 MED ORDER — ACETAMINOPHEN 650 MG RE SUPP: 650.0000 mg | Freq: Four times a day (QID) | RECTAL | Status: DC | PRN

## 2023-02-02 MED ORDER — CLOTRIMAZOLE 1 % EX CREA
TOPICAL_CREAM | Freq: Two times a day (BID) | CUTANEOUS | Status: DC
  Administered 2023-02-07: 1 via TOPICAL
  Filled 2023-02-02 (×4): qty 15

## 2023-02-02 MED ORDER — POTASSIUM CHLORIDE CRYS ER 20 MEQ PO TBCR
40.0000 meq | EXTENDED_RELEASE_TABLET | Freq: Two times a day (BID) | ORAL | Status: DC
  Administered 2023-02-02 (×2): 40 meq via ORAL
  Filled 2023-02-02 (×2): qty 2

## 2023-02-02 MED ORDER — SENNOSIDES-DOCUSATE SODIUM 8.6-50 MG PO TABS: 1.0000 | ORAL_TABLET | Freq: Every evening | ORAL | Status: DC | PRN

## 2023-02-02 MED ORDER — ENOXAPARIN SODIUM 40 MG/0.4ML IJ SOSY
40.0000 mg | PREFILLED_SYRINGE | INTRAMUSCULAR | Status: DC
  Administered 2023-02-02: 40 mg via SUBCUTANEOUS
  Filled 2023-02-02: qty 0.4

## 2023-02-02 MED ORDER — FUROSEMIDE 10 MG/ML IJ SOLN
40.0000 mg | Freq: Once | INTRAMUSCULAR | Status: AC
  Administered 2023-02-02: 40 mg via INTRAVENOUS
  Filled 2023-02-02: qty 4

## 2023-02-02 NOTE — H&P (Cosign Needed Addendum)
Date: 02/02/2023               Patient Name:  Maria [name] MRN: [id number]  DOB: 10/30/1956 Age / Sex: 67 y.o., female   PCP: Carson Myrtle, NP         Medical Service: Internal Medicine Teaching Service         Attending Physician: Dr. Aldine Contes, MD    First Contact: Dr. Roswell Nickel, MD Pager: [phone]  Second Contact: Dr. Virl Axe, MD Pager: [phone]       After Hours (After 5p/  First Contact Pager: [phone]  weekends / holidays): Second Contact Pager: [phone]   Chief Complaint: Fall  History of Present Illness: Maria [name] is a 67 y.o. female with PMH of of chronic lymphadenitis, morbid obesity, HTN, CKD stage IIIB, chronic diastolic heart failure, GERD, DDD, anxiety and depression, diabetes, and HLD who presented to the ED via EMS after a fall at home. Patient reports that over the last 2 weeks, she has had difficulty walking due to pain and swelling in her legs. Earlier this morning, she fell on her knee and was evaluated in the ED. imaging shows negative for fracture and she was discharged home with antibiotics for a UTI. She has had trouble getting out of bed in the last few days and this morning after she was able to make it out of bed, she put her slippers on and when she tried to sit on her rollator walker, it moved and patient fell on her left side. She denies losing consciousness or hitting her head.  She laid down for about an hour before being able to call 911. She complains of back pain, left shoulder pain and knee pains. She reports worsening swelling and redness of her lower extremities. Her left leg has been weeping.  She reports mild abdominal discomfort and chronic dyspnea on exertion but denies any fevers, chills, palpitations, chest pain, headache, dysuria or nausea/vomiting.   ED course: Normal vitals, afebrile. CMP shows creatinine around baseline. CBC shows mild anemia but no leukocytosis. BNP 26.2, troponins neg X2. CXR with enlarged cardiac  silhouette but no cardiopulmonary dz. CTH neg, T-spine and L-spine x-ray are negative for fractures. S/p IV Lasix 40 x 1 dose and IV Dilaudid 1 mg x 1.  IMTS consulted for admission.   Meds:  No outpatient medications have been marked as taking for the 02/02/23 encounter [facility] Encounter).    Allergies: Allergies as of 02/02/2023 - Review Complete 02/02/2023  Allergen Reaction Noted   Amoxicillin-pot clavulanate Nausea And Vomiting    Adhesive [tape] Other (See Comments) 08/19/2014   Codeine Other (See Comments)    Crestor [rosuvastatin calcium]  09/22/2019   Morphine and related Nausea And Vomiting and Other (See Comments) 05/23/2012   Vicodin [hydrocodone-acetaminophen] Other (See Comments) 05/23/2012   Latex Rash    Past Medical History:  Diagnosis Date   Acute lymphadenitis 2011   ALLERGIC RHINITIS 08/10/2007   ANXIETY 12/06/2007   Atherosclerotic peripheral vascular disease ([place]) 06/13/2013   Aorta on CT June 2014   Cellulitis 2011   Cervical disc disease 03/09/2012   CHF (congestive heart failure) (HCC)    Chronic pain 03/09/2012   DEPRESSION 12/06/2007   Diabetes ([place])    DIVERTICULOSIS, [place] 12/06/2007   GERD 12/06/2007   Hepatitis    age 54 hepatitis A   HLD (hyperlipidemia) 05/24/2019   HYPERTENSION 12/06/2007   Lumbar disc disease 03/09/2012   Mesenteric adenitis  MRSA 2006   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    Social History: Lives alone.  Her sister lives in [place].   Independent with all ADLs.  Does not on a car so uses [place] [employer] [place] cart for groceries and transportation provided by her insurance for medical appointments.  She denies any EtOH, tobacco or illicit drug use. PCP: Glory Buff, NP  Review of Systems: A complete ROS was negative except as per HPI.   Physical Exam: Blood pressure (!) 136/102, pulse 80, temperature 98.5 F (36.9 C), temperature source Oral, resp. rate 16, height 5' 3"$  (1.6 m), weight (!) 162.4 kg, SpO2 92 %.  General: Pleasant, well-appearing morbidly obese woman laying in bed.  No acute distress. CV: RRR. No murmurs, rubs, or gallops. Pulmonary: Lungs CTAB. Normal effort. Decreased air movement throughout. No wheezing or rales. Abdominal: Soft. Mild generalized ttp. Small ventral hernia. Normal bowel sounds. Extremities: Lymphedema of BLE. 2-3+ nonpitting edema up to knee bilaterally.  Warm extremities but unable to palpate DP pulses due to swelling.  Discoloration of the toenails with calluses on the plantar aspect of feet. Skin: Warm and dry. Mild tenderness and moderate erythema of the anterior aspect of both legs up to mid-calf. R leg with warmth, skin breakdown, weeping w/ clear fluids.  Erythematous bilateral groin with some moist on the left. (See media tab).  Neuro: A&Ox3. Moves all extremities. Normal sensation. No focal deficit. Psych: Normal mood and affect  EKG: personally reviewed my interpretation is normal sinus rhythm  DG Lumbar Spine Complete IMPRESSION: 1. No acute findings. No osseous fracture or subluxation. 2. Degenerative spondylosis of the lumbar spine, mild to moderate in degree, as detailed above. 3. Aortic atherosclerosis. DG Thoracic Spine 2 View IMPRESSION: 1. No acute findings. No osseous fracture or subluxation. 2. Degenerative spondylosis throughout the thoracic spine, mild to moderate in degree. 3. Mild scoliosis and kyphosis.  DG Chest 1 View IMPRESSION: Enlarged cardiac silhouette.  Electronically Signed   By: Sammie Bench M.D.   On: 02/02/2023 08:24  CT Head Wo Contrast  IMPRESSION: Stable and negative head CT.   Assessment & Plan by Problem: Principal Problem:   Fall  Maria [name] is a 67 y.o. female with PMH of of chronic lymphadenitis, morbid obesity, HTN, CKD stage IIIB, chronic diastolic heart failure, GERD, DDD, anxiety and depression, diabetes, and HLD who presented after a fall and admitted for lower extremity swelling and cellulitis  #Fall Patient with a history of diastolic heart failure and chronic lymphadenitis presented to the ER via EMS after mechanical fall. There was no loss of consciousness and no prodromal symptoms.  CT head negative. Imaging of the T-spine and L-spine does not show any fractures. Patient does have degenerative spondylosis of the L- and T-spine. This is patient's 2nd fall in the last 2 weeks secondary to pain  and swelling in the lower extremities.  Will admit for pain control and evaluation by PT. -Tylenol 1000 mg TID -Oxycodone 5 mg q6h prn for mod, severe pain -IV Dilaudid 0.5 mg q6h prn for breakthrough pain -Follow-up Vitamin D levels -PT/OT eval and treat -Fall precautions  #Lower extremity cellulitis #Chronic lymphadenitis Patient with a history of lymphadenitis presented with worsening lower extremity swelling and erythema. She reports that during her last hospitalization her legs were placed in [employer] and discharged home with home health. The swelling and the redness went down. Over the last few weeks she has had gradual worsening of the swelling as well as the redness. Her right leg has started weeping. On exam, she has significant lymphedema with superimposed cellulitis on the lower extremities, worse on the right. There is some drainage from skin breakdown on the right but no purulence or abscess. Patient afebrile, no leukocytosis she denies systemic symptoms. There is likely an element of venous insufficiency  contributing to her chronic wounds. Will treat with IV antibiotics while inpatient and transition to oral at discharge. -IV Rocephin -Wound care consult -Unna boot -Referral to outpatient lymphedema clinic at discharge  #Chronic diastolic heart failure Last echo 09/2021 showed EF 65-70%, G2DD but no valvular abnormalities. Follows with [facility] heart care. She endorse some chronic dyspnea on exertion but no orthopnea or shortness of breath at rest. CXR without vascular congestion or pleural effusion. BNP wnl. Per PCPs note from 2/12, patient assume she was informed to decrease her torsemide dose to 60 mg daily however patient's nephrologist advised patient to go back to 60 mg twice daily. Patient's heart failure likely contributing to her lower extremity swelling due to her decreased torsemide dose but no concern for heart failure exacerbation. S/p IV Lasix 40 mg x 1 dose in the ED. -Start IV Lasix 40 mg twice daily -KCl 40 mEq BID -Strict I&O's, daily weights -Trend BMP, mag and keep K+ >4.0, Mag>2.0  #HTN BP stable with SBP in the 110s to 130s. Heart rate in the 70s to 80s. -Metoprolol 25 mg daily  #CKD stage IIIB Unclear baseline creatinine but likely around 1.1-1.3 on chart review. BMP with creatinine of 1.31 and K+ of 3.8.  -Avoid nephrotoxic agents -Daily BMP  #Type 2 diabetes #Class III obesity Last A1c 6.9% 1 year ago. -Check A1c -Farxiga 5 mg daily  #Multinodular nodule FNA of the left lower thyroid lobe showed benign hyperplastic nodule, Bethesda category 2.  Last TSH 2.35 five months ago. -Follow-up TSH  #Intertrigo Patient with morbid obesity and abdominal pannus found to have erythema with moist and bilateral groin, worse on the right. -Clotrimazole cream twice daily  #Anxiety and depression: Resume Celexa 20 mg daily #Restless leg syndrome: Resume Pramipexole 0.5 mg 3 times daily   CODE STATUS: Full DIET: Carb modified PPx: Lovenox  Dispo: Admit patient to  Observation with expected length of stay less than 2 midnights.  Signed: Lacinda Axon, MD 02/02/2023, 11:45 AM  Pager: [phone] Internal Medicine Teaching Service After 5pm on weekdays and 1pm on weekends: On Call pager: [phone]

## 2023-02-02 NOTE — Progress Notes (Incomplete)
Subjective:  Xxx    Objective: Vitals over previous 24hr: Vitals:   02/02/23 1145 02/02/23 1200 02/02/23 1239 02/02/23 1249  BP:  (!) 114/98 (!) 118/55   Pulse: 74 70 78   Resp: 16  18   Temp:   97.6 F (36.4 C)   TempSrc:   Oral   SpO2: 96% 100% 98%   Weight:    (!) 164.9 kg  Height:    5' 3"$  (1.6 m)    General:      awake and alert, lying comfortably in bed, cooperative, not in acute distress Skin:       warm and dry, intact without any obvious lesions or scars, no rashes Head:      normocephalic and atraumatic, oral mucosa moist with good dentition, no lymphadenopathy Eyes:      extraocular movements intact, conjunctivae pink, pupils round and reactive to light, no periorbital swelling or scleral icterus Ears:       pinnae normal, no discharge or external lesions  Nose:      symmetrical and without mucosal inflammation, no external lesions or discharge Lungs:      normal respiratory effort, breathing unlabored, symmetrical chest rise, no crackles or wheezing Cardiac:      regular rate and rhythm, normal S1 and S2, capillary refill 2-3 seconds, no pitting edema Abdomen:      soft and non-distended, normoactive bowel sounds present in all four quadrants, no tenderness to palpation or guarding Musculoskeletal:  full range of motion in joints, motor strength 5 /5 in all four extremities, no obvious deformities Neurologic:      oriented to person-place-time, moving all extremities, sensation to light touch intact, no gross focal deficits Psychiatric:      euthymic mood with congruent affect, intelligible speech    Assessment/Plan: Ms Cadle is a 67 year old female with a past medical history of morbid obesity, hypertension, hyperlipidemia, chronic diastolic heart failure, chronic lymphadenitis, CKD IIIb, and DDD who was delivered to the ED after a mechanical fall at home, now admitted for management of lower extremity cellulitis and chronic  lymphadenitis.    ---Lower extremity cellulitis ---Chronic lymphadenitis Patient has a history of lymphadenitis and morbid obesity. During her most recent hospitalization, her legs were placed in an Unna boot and she was discharged with home health. She presented on 2-13 with lower extremity swelling and erythema, which has worsened over the last few weeks. Exam upon arrival to the ED notable for significant lymphedema complicated by bilateral lower extremity cellulitis right worse than left. Vitals including temperature were unremarkable and laboratory findings without leukocytosis. Lymphedema likely has component of venous insufficiency secondary to morbid obesity and physical inactivity, heart failure probably contributing as well. Primary team will treat cellulitis with intravenous antibiotics and then transition to an oral regimen upon discharge. > Wound consult, appreciate recommendations  > Ceftriaxone 2g IV q24 > Unna boot > Consider lymphedema clinic referral upon discharge   ---Mechanical fall Patient was delivered to [facility] ED after experiencing a mechanical fall without loss of consciousness or prodromal symptoms. Upon arrival, imaging was negative for acute intracranial process and thoracic-lumbar fracture, but identified mild-moderate degenerative spondylosis. Etiology of falls is likely pain and increased lower extremity swelling. Physical therapy evaluation is warranted to assess for possible deconditioning component. > Acetaminophen 1059m q8 > Oxycodone 556mq6 PRN, moderate-severe pain > Hydromorphone 0.82m30m6 PRN, breakthrough pain > Check vitamin D level > Physical-occupational therapy evaluation > Fall precautions   ---Chronic diastolic heart  failure Patient has history of diastolic heart failure, most recent echocardiogram performed 09-2021 demonstrated ejection fraction 65-70% and grade II diastolic dysfunction. She reports chronic dyspnea on exertion without resting breath  shortness or orthopnea. Upon arrival, chest radiograph negative for vascular congestion and BNP within normal limits. Although the patient takes torsemide at home, she demonstrated confusion about proper dosing and has only been taking one dose daily rather than two as prescribed. Heart failure is likely contributing to lower extremity swelling, but concern remains low for acute exacerbation. > Furosemide 51m IV q12 > Potassium chloride 410m q12 > Monitor ins-outs > Trend BMP q24   ---[facility]ension [facility] of [facility] Since arrival, systolic blood pressure has been stable within 110-130 range. > Metoprolol 2528m24   ---Chronic kidney disease IIIb Patient has history of chronic kidney disease with baseline creatinine roughly around 1.1-1.3 per electronic medical record. Upon arrival, her creatinine was 1.31 and potassium 3.8. > Trend BMP q24 > Avoid nephrotoxic agents   ---Diabetes II ---Obesity III Patient has history of morbid obesity and diabetes, her current BMI is 64. Previous A1C collected 01-2022 was 6.9. Managed at home with dapagliflozin. Upon arrival, her A1C was 6.5.  > Dapagliflozin 5mg42m4   ---Thyroid multinodular mass Patient has history of thyroid nodule. Fine-needle aspiration of left lower thyroid lobe demonstrated benign hyperplastic nodule classified as Bethesda category 2. Most recent TSH level was 2.35 collected 07-2022. > Monitor clinically   ---Intertrigo Patient has history of morbid obesity and abdominal pannus. Upon arrival, erythematous patch was present in her inguinal region.  > Clotrimazole cream 1% q12   ---Anxiety ---Depression Patient has history of anxiety and depression managed at home with citalopram. > Citalopram 20mg65m   ---Restless legs syndrome Patient has a history of restless legs syndrome managed at home with pramipexole. > Pramipexole 0.5mg q33m   Principal Problem:    Fall  ***   Prior to Admission Living Arrangement:  Anticipated Discharge Location: pending Barriers to Discharge: symptom treatment, physical evaluation Dispo: Anticipated discharge in approximately 1-2 day(s).    Dan HaRoswell Nickelnternal Medicine PGY-1 Pager [phone] 5pm on weekdays and 1pm on weekends: On Call pager [phone]

## 2023-02-02 NOTE — ED Provider Notes (Signed)
[facility] Provider Note   CSN: [id number] Arrival date & time: 02/02/23  [id number]     History  Chief Complaint  Patient presents with   Maria [name] is a 67 y.o. female.  Patient complains of increased swelling and lymphedema and bilateral lower legs.  Patient reports she was trying to get in her rollator this a.m.  Patient reports she could not support her weight and fell to the ground hitting her low back.  Patient reports she has a history of lymphedema and chronic diastolic heart failure.  Patient has chronic kidney and disease type 2 diabetes.  Patient reports she has had to be hospitalized in the past when she had this much fluid overload.  Current fluid is keeping her from being able to move into her wheelchair  The history is provided by the patient. No language interpreter was used.  Fall This is a new problem. The current episode started 3 to 5 hours ago. The problem occurs constantly. The problem has not changed since onset.Associated symptoms include shortness of breath. Pertinent negatives include no chest pain. Nothing aggravates the symptoms. Nothing relieves the symptoms.       Home Medications Prior to Admission medications   Medication Sig Start Date End Date Taking? Authorizing Provider  albuterol (PROAIR HFA) 108 (90 Base) MCG/ACT inhaler Inhale 2 puffs into the lungs every 6 (six) hours as needed for wheezing or shortness of breath. 11/05/20   Lucretia Kern, DO  Blood Glucose Monitoring Suppl (ACCU-CHEK GUIDE) w/Device KIT Use as directed Patient taking differently: 1 each by Other route as directed. 04/11/21   Elodia Florence., MD  cefdinir (OMNICEF) 300 MG capsule Take 1 capsule (300 mg total) by mouth 2 (two) times daily. 01/23/23   Azucena Cecil, PA-C  cholecalciferol (VITAMIN D3) 25 MCG (1000 UNIT) tablet Take 2,000 Units by mouth daily.    [provider]  citalopram (CELEXA) 20 MG  tablet Take 1 tablet (20 mg total) by mouth daily. 11/02/22   Biagio Borg, MD  clonazePAM (KLONOPIN) 1 MG tablet Take 1 tablet (1 mg total) by mouth 2 (two) times daily as needed for anxiety. for anxiety 12/09/22   Biagio Borg, MD  cyclobenzaprine (FLEXERIL) 10 MG tablet TAKE 1 TABLET BY MOUTH 2 TIMES A DAY AS NEEDED FOR MUSCLE SPASMS Patient taking differently: Take 10 mg by mouth 2 (two) times daily as needed for muscle spasms. 02/27/21   Biagio Borg, MD  doxycycline (VIBRAMYCIN) 100 MG capsule Take 1 capsule (100 mg total) by mouth 2 (two) times daily. 12/10/22   Hayden Rasmussen, MD  FARXIGA 5 MG TABS tablet Take 5 mg by mouth daily. 07/06/22   [provider]  fluticasone (FLONASE) 50 MCG/ACT nasal spray Place into both nostrils. 12/07/22   [provider]  glucose blood (ACCU-CHEK GUIDE) test strip Use as instructed up to 4 times daily 04/11/21   Elodia Florence., MD  Metamucil Fiber CHEW Chew 3 tablets by mouth at bedtime.    [provider]  metolazone (ZAROXOLYN) 5 MG tablet Take 5 mg by mouth as directed. Per nephrologist on Tuesday and Friday    Claudia Desanctis, MD  metoprolol tartrate (LOPRESSOR) 25 MG tablet Take 25 mg by mouth in the morning.    [provider]  NARCAN 4 MG/0.1ML LIQD nasal spray kit Place 0.4 mg into the nose daily as  needed (opioid overdose). 04/25/19   [provider]  ondansetron (ZOFRAN ODT) 4 MG disintegrating tablet Take 1 tablet (4 mg total) by mouth every 4 (four) hours as needed for nausea or vomiting. 08/23/19   Charlesetta Shanks, MD  Oxycodone HCl 10 MG TABS Take 1 tablet (10 mg total) 4 (four) times daily by mouth. Per [facility] Patient taking differently: Take 10 mg by mouth every 4 (four) hours as needed (pain). Per [facility] 11/08/17   Biagio Borg, MD  pantoprazole (PROTONIX) 40 MG tablet TAKE ONE TABLET BY MOUTH DAILY 01/29/22   Biagio Borg, MD  Polyethyl Glycol-Propyl Glycol  0.4-0.3 % SOLN Apply 1 drop to eye daily as needed (dry eyes).    [provider]  potassium chloride SA (KLOR-CON M) 20 MEQ tablet Take 4 tablets (80 mEq total) by mouth 2 (two) times daily. Patient taking differently: Take 40 mEq by mouth 2 (two) times daily. 09/01/22   Minus Breeding, MD  pramipexole (MIRAPEX) 0.5 MG tablet Take 1 tablet (0.5 mg total) by mouth 3 (three) times daily. 06/24/22   Biagio Borg, MD  spironolactone (ALDACTONE) 25 MG tablet Take 0.5 tablets (12.5 mg total) by mouth daily. Patient taking differently: Take 25 mg by mouth daily. 08/14/22   Finis Bud, NP  torsemide (DEMADEX) 20 MG tablet Take 3 tablets (60 mg total) by mouth 2 (two) times daily. Patient taking differently: Take 60 mg by mouth in the morning. 09/01/22   Minus Breeding, MD  DULoxetine (CYMBALTA) 60 MG capsule Take 60 mg by mouth daily.    03/09/12  [provider]      Allergies    Amoxicillin-pot clavulanate, Adhesive [tape], Codeine, Crestor [rosuvastatin calcium], Morphine and related, Vicodin [hydrocodone-acetaminophen], and Latex    Review of Systems   Review of Systems  Respiratory:  Positive for shortness of breath.   Cardiovascular:  Negative for chest pain.  All other systems reviewed and are negative.   Physical Exam Updated Vital Signs BP (!) 126/98   Pulse 77   Temp (!) 97.5 F (36.4 C) (Oral)   Resp 17   Ht 5' 3"$  (1.6 m)   Wt (!) 162.4 kg   SpO2 99%   BMI 63.42 kg/m  Physical Exam Vitals and nursing note reviewed.  Constitutional:      Appearance: She is well-developed.  HENT:     Head: Normocephalic.     Right Ear: Tympanic membrane normal.     Left Ear: Tympanic membrane normal.     Mouth/Throat:     Mouth: Mucous membranes are moist.  Eyes:     Extraocular Movements: Extraocular movements intact.     Pupils: Pupils are equal, round, and reactive to light.  Cardiovascular:     Rate and Rhythm: Normal rate.  Pulmonary:     Effort: Pulmonary  effort is normal.  Abdominal:     General: Abdomen is flat. There is no distension.  Musculoskeletal:        General: Swelling and tenderness present.     Cervical back: Normal range of motion.     Comments: Patient has weeping edema and redness bilateral lower extremities  Skin:    General: Skin is warm.  Neurological:     General: No focal deficit present.     Mental Status: She is alert and oriented to person, place, and time.  Psychiatric:        Mood and Affect: Mood normal.  ED Results / Procedures / Treatments   Labs (all labs ordered are listed, but only abnormal results are displayed) Labs Reviewed  CBC WITH DIFFERENTIAL/PLATELET - Abnormal; Notable for the following components:      Result Value   RBC 3.82 (*)    Hemoglobin 11.2 (*)    HCT 33.7 (*)    Neutro Abs 8.3 (*)    All other components within normal limits  COMPREHENSIVE METABOLIC PANEL - Abnormal; Notable for the following components:   Sodium 133 (*)    Chloride 94 (*)    Glucose, Bld 155 (*)    BUN 24 (*)    Creatinine, Ser 1.31 (*)    Total Bilirubin 1.5 (*)    GFR, Estimated 45 (*)    All other components within normal limits  BRAIN NATRIURETIC PEPTIDE  TROPONIN I (HIGH SENSITIVITY)  TROPONIN I (HIGH SENSITIVITY)    EKG EKG Interpretation  Date/Time:  Tuesday February 02 2023 07:00:52 EST Ventricular Rate:  77 PR Interval:  151 QRS Duration: 117 QT Interval:  399 QTC Calculation: 452 R Axis:   69 Text Interpretation: Sinus rhythm Nonspecific intraventricular conduction delay Low voltage, precordial leads Artifact Abnormal ECG Confirmed by Carmin Muskrat [phone]) on 02/02/2023 7:15:40 AM  Radiology DG Lumbar Spine Complete  Result Date: 02/02/2023 CLINICAL DATA:  Fall, shortness of breath. EXAM: LUMBAR SPINE - COMPLETE 4+ VIEW COMPARISON:  Plain film of the lumbar spine dated 08/26/2016. FINDINGS: Alignment appears stable. No evidence of acute/focal vertebral body subluxation. No  fracture line or displaced fracture fragment is seen. No evidence of a pars interarticularis defect. Degenerative spondylosis of the lumbar spine, mild to moderate in degree, with mild vertebral body spurring and prominent degenerative facet hypertrophy throughout. Atherosclerosis of the infrarenal abdominal aorta. Visualized paravertebral soft tissues are otherwise unremarkable. IMPRESSION: 1. No acute findings. No osseous fracture or subluxation. 2. Degenerative spondylosis of the lumbar spine, mild to moderate in degree, as detailed above. 3. Aortic atherosclerosis. Electronically Signed   By: Franki Cabot M.D.   On: 02/02/2023 08:28   DG Thoracic Spine 2 View  Result Date: 02/02/2023 CLINICAL DATA:  Fall, shortness of breath. EXAM: THORACIC SPINE 2 VIEWS COMPARISON:  None Available. FINDINGS: Mild scoliosis and kyphosis. No evidence of acute/focal vertebral body subluxation. No fracture line or displaced fracture fragment is seen. No evidence of an acute compression fracture deformity. Degenerative spondylosis throughout the thoracic spine, mild to moderate in degree with associated disc space narrowings and mild osseous spurring. Visualized paravertebral soft tissues are unremarkable. IMPRESSION: 1. No acute findings. No osseous fracture or subluxation. 2. Degenerative spondylosis throughout the thoracic spine, mild to moderate in degree. 3. Mild scoliosis and kyphosis. Electronically Signed   By: Franki Cabot M.D.   On: 02/02/2023 08:26   DG Chest 1 View  Result Date: 02/02/2023 CLINICAL DATA:  SOB EXAM: PORTABLE CHEST 1 VIEW COMPARISON:  04/30/2022 FINDINGS: Cardiac silhouette appears prominent. No pneumonia or pulmonary edema. No pneumothorax or pleural effusion. IMPRESSION: Enlarged cardiac silhouette. Electronically Signed   By: Sammie Bench M.D.   On: 02/02/2023 08:24   CT Head Wo Contrast  Result Date: 02/02/2023 CLINICAL DATA:  Minor head trauma due to fall EXAM: CT HEAD WITHOUT  CONTRAST TECHNIQUE: Contiguous axial images were obtained from the base of the skull through the vertex without intravenous contrast. RADIATION DOSE REDUCTION: This exam was performed according to the departmental dose-optimization program which includes automated exposure control, adjustment of the mA and/or kV according to  patient size and/or use of iterative reconstruction technique. COMPARISON:  Ten days ago FINDINGS: Brain: No evidence of acute infarction, hemorrhage, hydrocephalus, extra-axial collection or mass lesion/mass effect. Vascular: No hyperdense vessel or unexpected calcification. Skull: Normal. Negative for fracture or focal lesion. Sinuses/Orbits: No acute finding. IMPRESSION: Stable and negative head CT. Electronically Signed   By: Jorje Guild M.D.   On: 02/02/2023 07:58    Procedures Procedures    Medications Ordered in ED Medications  furosemide (LASIX) 8 MG/ML solution 40 mg (has no administration in time range)  HYDROmorphone (DILAUDID) injection 1 mg (1 mg Intravenous Given 02/02/23 [id number])    ED Course/ Medical Decision Making/ A&P                             Medical Decision Making Complains of fluid overload in her legs and shortness of breath  Amount and/or Complexity of Data Reviewed External Data Reviewed: notes.    Details: Views notes reviewed.  Patient is followed by Dr. Percival Spanish Labs: ordered. Decision-making details documented in ED Course.    Details: Labs ordered reviewed and interpreted  11.3 BUN is 24 creatinine is 1.31 Radiology: ordered and independent interpretation performed. Decision-making details documented in ED Course.    Details: Chest x-ray shows enlarged cardiac silhouette Thoracic and lumbar spine show no evidence of fracture And no acute abnormality ECG/medicine tests: ordered and independent interpretation performed. Decision-making details documented in ED Course. Discussion of management or test interpretation with external  provider(s): Will consult unassigned medicine to admit patient for diuresis. I spoke with [facility] Internal Medicine Physician who advised he will admit    Risk Prescription drug management. Risk Details: Patient given 40 mg IV Lasix.            Final Clinical Impression(s) / ED Diagnoses Final diagnoses:  Fall, initial encounter  Lymphedema  Chronic right-sided congestive heart failure [facility])    Rx / DC Orders ED Discharge Orders     None         Sidney Ace 02/02/23 1055    Carmin Muskrat, MD 02/02/23 1255

## 2023-02-02 NOTE — ED Notes (Signed)
Patient transported to CT 

## 2023-02-02 NOTE — ED Notes (Signed)
ED TO INPATIENT HANDOFF REPORT  ED Nurse Name and Phone #: [phone]   S Name/Age/Gender Maria [name] 66 y.o. female Room/Bed: 016C/016C  Code Status   Code Status: Full Code  Home/SNF/Other Home Patient oriented to: self, place, time, and situation Is this baseline? Yes   Triage Complete: Triage complete  Chief Complaint Fall [W19.XXXA]  Triage Note Pt BIB [facility] from home for a fall. Pt was attempting to sit back on her roller walker and fell. Pt hit head on TV stand. No LOC, no thinners. Pt c/o lower back pain and pain in knees from previous fall. VSS, AO4   Allergies Allergies  Allergen Reactions   Amoxicillin-Pot Clavulanate Nausea And Vomiting    Projectile vomiting Did it involve swelling of the face/tongue/throat, SOB, or low BP? No Did it involve sudden or severe rash/hives, skin peeling, or any reaction on the inside of your mouth or nose? No Did you need to seek medical attention at a hospital or doctor's office? No When did it last happen?      20-30 years If all above answers are "NO", may proceed with cephalosporin use.    Adhesive [Tape] Other (See Comments)    Tears skin off - use paper tape    Codeine Other (See Comments)    hallucinations   Crestor [Rosuvastatin Calcium]     Severe muscle cramps   Morphine And Related Nausea And Vomiting and Other (See Comments)    Migraine headaches   Vicodin [Hydrocodone-Acetaminophen] Other (See Comments)    hallucinations   Latex Rash    Level of Care/Admitting Diagnosis ED Disposition     ED Disposition  Admit   Condition  --   [place]: [place] [id number]  Level of Care: Med-Surg [16]  May place patient in observation at [facility] or [facility] if equivalent level of care is available:: No  Covid Evaluation: Asymptomatic - no recent exposure (last 10 days) testing not required  Diagnosis: Fall [[id number]]  Admitting Physician: Aldine Contes [phone]  Attending  Physician: Aldine Contes [id number]          B Medical/Surgery History Past Medical History:  Diagnosis Date   Acute lymphadenitis 2011   ALLERGIC RHINITIS 08/10/2007   ANXIETY 12/06/2007   Atherosclerotic peripheral vascular disease ([place]) 06/13/2013   Aorta on CT June 2014   Cellulitis 2011   Cervical disc disease 03/09/2012   CHF (congestive heart failure) ([place])    Chronic pain 03/09/2012   DEPRESSION 12/06/2007   Diabetes ([place])    DIVERTICULOSIS, [place] 12/06/2007   GERD 12/06/2007   Hepatitis    age 60 hepatitis A   HLD (hyperlipidemia) 05/24/2019   HYPERTENSION 12/06/2007   Lumbar disc disease 03/09/2012   Mesenteric adenitis    MRSA 2006   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 2010   [place]  may 24th 2010   COLONOSCOPY WITH PROPOFOL N/A 07/10/2016   Procedure: COLONOSCOPY WITH PROPOFOL;  Surgeon: Manus Gunning, MD;  Location: [facility];  Service: Gastroenterology;  Laterality: N/A;   COLONOSCOPY WITH PROPOFOL N/A 09/26/2019   Procedure: COLONOSCOPY WITH PROPOFOL;  Surgeon: Yetta Flock, MD;  Location: [facility];  Service: Gastroenterology;  Laterality: N/A;   ESOPHAGOGASTRODUODENOSCOPY (EGD) WITH  PROPOFOL N/A 07/10/2016   Procedure: ESOPHAGOGASTRODUODENOSCOPY (EGD) WITH PROPOFOL;  Surgeon: Manus Gunning, MD;  Location: [facility];  Service: Gastroenterology;  Laterality: N/A;   POLYPECTOMY  09/26/2019   Procedure: POLYPECTOMY;  Surgeon: Yetta Flock, MD;  Location: [facility];  Service: Gastroenterology;;   s/p ovary cyst     s/p right knee arthroscopy     Dr. Mardelle Matte ortho     A IV Location/Drains/Wounds Patient Lines/Drains/Airways Status     Active Line/Drains/Airways     Name Placement date  Placement time Site Days   Peripheral IV 02/02/23 20 G Anterior;Left;Proximal;Upper Arm 02/02/23  0736  Arm  less than 1            Intake/Output Last 24 hours  Intake/Output Summary (Last 24 hours) at 02/02/2023 1146 Last data filed at 02/02/2023 1106 Gross per 24 hour  Intake --  Output 1000 ml  Net -1000 ml    Labs/Imaging Results for orders placed or performed during the hospital encounter of 02/02/23 (from the past 48 hour(s))  Brain natriuretic peptide     Status: None   Collection Time: 02/02/23  7:24 AM  Result Value Ref Range   B Natriuretic Peptide 26.2 0.0 - 100.0 pg/mL    Comment: Performed at [facility], [address], [place], [place] [address]  CBC with Differential     Status: Abnormal   Collection Time: 02/02/23  7:35 AM  Result Value Ref Range   WBC 10.4 4.0 - 10.5 K/uL   RBC 3.82 (L) 3.87 - 5.11 MIL/uL   Hemoglobin 11.2 (L) 12.0 - 15.0 g/dL   HCT 33.7 (L) 36.0 - 46.0 %   MCV 88.2 80.0 - 100.0 fL   MCH 29.3 26.0 - 34.0 pg   MCHC 33.2 30.0 - 36.0 g/dL   RDW 14.6 11.5 - 15.5 %   Platelets 233 150 - 400 K/uL   nRBC 0.0 0.0 - 0.2 %   Neutrophils Relative % 78 %   Neutro Abs 8.3 (H) 1.7 - 7.7 K/uL   Lymphocytes Relative 13 %   Lymphs Abs 1.3 0.7 - 4.0 K/uL   Monocytes Relative 7 %   Monocytes Absolute 0.7 0.1 - 1.0 K/uL   Eosinophils Relative 1 %   Eosinophils Absolute 0.1 0.0 - 0.5 K/uL   Basophils Relative 0 %   Basophils Absolute 0.0 0.0 - 0.1 K/uL   Immature Granulocytes 1 %   Abs Immature Granulocytes 0.05 0.00 - 0.07 K/uL    Comment: Performed at [facility] [address], [place], [place] [address]  Comprehensive metabolic panel     Status: Abnormal   Collection Time: 02/02/23  7:35 AM  Result Value Ref Range   Sodium 133 (L) 135 - 145 mmol/L   Potassium 3.8 3.5 - 5.1 mmol/L   Chloride 94 (L) 98 - 111 mmol/L   CO2 27 22 - 32 mmol/L   Glucose, Bld 155 (H) 70 - 99 mg/dL    Comment: Glucose reference range applies only to  samples taken after fasting for at least 8 hours.   BUN 24 (H) 8 - 23 mg/dL   Creatinine, Ser 1.31 (H) 0.44 - 1.00 mg/dL   Calcium 9.1 8.9 - 10.3 mg/dL   Total Protein 6.8 6.5 - 8.1 g/dL   Albumin 3.5 3.5 - 5.0 g/dL   AST 20 15 - 41 U/L   ALT 17 0 - 44 U/L   Alkaline Phosphatase 66 38 -  126 U/L   Total Bilirubin 1.5 (H) 0.3 - 1.2 mg/dL   GFR, Estimated 45 (L) >60 mL/min    Comment: (NOTE) Calculated using the CKD-EPI Creatinine Equation (2021)    Anion gap 12 5 - 15    Comment: Performed at [facility] [address] [place], [place] [address]  Troponin I (High Sensitivity)     Status: None   Collection Time: 02/02/23  7:35 AM  Result Value Ref Range   Troponin I (High Sensitivity) 7 <18 ng/L    Comment: (NOTE) Elevated high sensitivity troponin I (hsTnI) values and significant  changes across serial measurements may suggest ACS but many other  chronic and acute conditions are known to elevate hsTnI results.  Refer to the "Links" section for chest pain algorithms and additional  guidance. Performed at [facility], [place] [address] [place], [place] [address]   Troponin I (High Sensitivity)     Status: None   Collection Time: 02/02/23  9:23 AM  Result Value Ref Range   Troponin I (High Sensitivity) 10 <18 ng/L    Comment: (NOTE) Elevated high sensitivity troponin I (hsTnI) values and significant  changes across serial measurements may suggest ACS but many other  chronic and acute conditions are known to elevate hsTnI results.  Refer to the "Links" section for chest pain algorithms and additional  guidance. Performed at [facility], [place] [address], [place], [place] [address]    DG Lumbar Spine Complete  Result Date: 02/02/2023 CLINICAL DATA:  Fall, shortness of breath. EXAM: LUMBAR SPINE - COMPLETE 4+ VIEW COMPARISON:  Plain film of the lumbar spine dated 08/26/2016. FINDINGS: Alignment appears stable. No evidence of acute/focal vertebral body  subluxation. No fracture line or displaced fracture fragment is seen. No evidence of a pars interarticularis defect. Degenerative spondylosis of the lumbar spine, mild to moderate in degree, with mild vertebral body spurring and prominent degenerative facet hypertrophy throughout. Atherosclerosis of the infrarenal abdominal aorta. Visualized paravertebral soft tissues are otherwise unremarkable. IMPRESSION: 1. No acute findings. No osseous fracture or subluxation. 2. Degenerative spondylosis of the lumbar spine, mild to moderate in degree, as detailed above. 3. Aortic atherosclerosis. Electronically Signed   By: Franki Cabot M.D.   On: 02/02/2023 08:28   DG Thoracic Spine 2 View  Result Date: 02/02/2023 CLINICAL DATA:  Fall, shortness of breath. EXAM: THORACIC SPINE 2 VIEWS COMPARISON:  None Available. FINDINGS: Mild scoliosis and kyphosis. No evidence of acute/focal vertebral body subluxation. No fracture line or displaced fracture fragment is seen. No evidence of an acute compression fracture deformity. Degenerative spondylosis throughout the thoracic spine, mild to moderate in degree with associated disc space narrowings and mild osseous spurring. Visualized paravertebral soft tissues are unremarkable. IMPRESSION: 1. No acute findings. No osseous fracture or subluxation. 2. Degenerative spondylosis throughout the thoracic spine, mild to moderate in degree. 3. Mild scoliosis and kyphosis. Electronically Signed   By: Franki Cabot M.D.   On: 02/02/2023 08:26   DG Chest 1 View  Result Date: 02/02/2023 CLINICAL DATA:  SOB EXAM: PORTABLE CHEST 1 VIEW COMPARISON:  04/30/2022 FINDINGS: Cardiac silhouette appears prominent. No pneumonia or pulmonary edema. No pneumothorax or pleural effusion. IMPRESSION: Enlarged cardiac silhouette. Electronically Signed   By: Sammie Bench M.D.   On: 02/02/2023 08:24   CT Head Wo Contrast  Result Date: 02/02/2023 CLINICAL DATA:  Minor head trauma due to fall EXAM: CT  HEAD WITHOUT CONTRAST TECHNIQUE: Contiguous axial images were obtained  from the base of the skull through the vertex without intravenous contrast. RADIATION DOSE REDUCTION: This exam was performed according to the departmental dose-optimization program which includes automated exposure control, adjustment of the mA and/or kV according to patient size and/or use of iterative reconstruction technique. COMPARISON:  Ten days ago FINDINGS: Brain: No evidence of acute infarction, hemorrhage, hydrocephalus, extra-axial collection or mass lesion/mass effect. Vascular: No hyperdense vessel or unexpected calcification. Skull: Normal. Negative for fracture or focal lesion. Sinuses/Orbits: No acute finding. IMPRESSION: Stable and negative head CT. Electronically Signed   By: Jorje Guild M.D.   On: 02/02/2023 07:58    Pending Labs Unresulted Labs (From admission, onward)     Start     Ordered   02/02/23 1145  Lipid panel  Once,   R        02/02/23 1144   02/02/23 1144  TSH  Once,   R        02/02/23 1144   02/02/23 1144  VITAMIN D 25 Hydroxy (Vit-D Deficiency, Fractures)  Once,   R        02/02/23 1144   02/02/23 1144  Hemoglobin A1c  Once,   R        02/02/23 1144            Vitals/Pain Today's Vitals   02/02/23 0955 02/02/23 1030 02/02/23 1100 02/02/23 1132  BP:  (!) 136/56 (!) 136/102   Pulse:  76 80   Resp:  12 16   Temp:    98.5 F (36.9 C)  TempSrc:    Oral  SpO2:  95% 92%   Weight:      Height:      PainSc: 6        Isolation Precautions No active isolations  Medications Medications  enoxaparin (LOVENOX) injection 40 mg (has no administration in time range)  acetaminophen (TYLENOL) tablet 650 mg (has no administration in time range)    Or  acetaminophen (TYLENOL) suppository 650 mg (has no administration in time range)  senna-docusate (Senokot-S) tablet 1 tablet (has no administration in time range)  ondansetron (ZOFRAN) tablet 4 mg (has no administration in time range)     Or  ondansetron (ZOFRAN) injection 4 mg (has no administration in time range)  cefTRIAXone (ROCEPHIN) 2 g in sodium chloride 0.9 % 100 mL IVPB (has no administration in time range)  HYDROmorphone (DILAUDID) injection 1 mg (1 mg Intravenous Given 02/02/23 0904)  furosemide (LASIX) injection 40 mg (40 mg Intravenous Given 02/02/23 [id number])    Mobility walks with device     Focused Assessments    R Recommendations: See Admitting Provider Note  Report given to:   Additional Notes:

## 2023-02-02 NOTE — Consult Note (Addendum)
WOC Nurse Consult Note: Reason for Consult:bilateral lymphedema with RLE (lateral) excoriations vs abrasions sustained in fall. Erythema intertrigo to the subpannicular, inframammary and bilateral inguinal areas. Wound type:Lymphedema, erythema intertrigo ICD-10 CM Codes for Irritant Dermatitis L24A9 - Due to friction or contact with other specified body fluids L30.4  - Erythema intertrigo. Also used for abrasion of the hand, chafing of the skin, dermatitis due to sweating and friction, friction dermatitis, friction eczema, and genital/thigh intertrigo.  Pressure Injury POA: N/A Measurement:N/A Wound bed:See photodocumentation of skin integrity in Media  Drainage (amount, consistency, odor) serous weeping from bilateral LEs Periwound:erythematous, edematous Dressing procedure/placement/frequency:I have provided Nursing with guidance for the care of the bilateral LEs using a cleanse, dry and application of Eucerin moisturizing cream. The open weeping areas are to be covered with folded layers of xeroform gauze (nonadherent, antimicrobial). This is to be secured by wrapping from just below toes to just below knees with Kerlix roll gauze and the Kerlix roll gauze is to be topped with 6-inch ACE bandages applied in a similar manner. Feet are to be placed into Prevalon boots.  A sacral foam is to be placed for PI prevention. Intertriginous irritant contact dermatitis, specifically erythema intertrigo will be addressed using our house antimicrobial moisture wicking textile, InterDry.  Recommend systemic antifungal to augment tissue recovery efforts. If you agree, please order.  [place] nursing team will not follow, but will remain available to this patient, the nursing and medical teams.  Please re-consult if needed.  Thank you for inviting [place] to participate in this patient's Plan of Care.  Maudie Flakes, MSN, RN, CNS, [place], Serita Grammes, [employer], [employer] phone:  [phone]

## 2023-02-02 NOTE — ED Triage Notes (Signed)
Pt BIB [facility] from home for a fall. Pt was attempting to sit back on her roller walker and fell. Pt hit head on TV stand. No LOC, no thinners. Pt c/o lower back pain and pain in knees from previous fall. VSS, AO4

## 2023-02-03 DIAGNOSIS — Z1152 Encounter for screening for COVID-19: Secondary | ICD-10-CM | POA: Diagnosis not present

## 2023-02-03 DIAGNOSIS — I881 Chronic lymphadenitis, except mesenteric: Secondary | ICD-10-CM | POA: Diagnosis present

## 2023-02-03 DIAGNOSIS — I501 Left ventricular failure: Secondary | ICD-10-CM | POA: Diagnosis not present

## 2023-02-03 DIAGNOSIS — Z87891 Personal history of nicotine dependence: Secondary | ICD-10-CM | POA: Diagnosis not present

## 2023-02-03 DIAGNOSIS — F32A Depression, unspecified: Secondary | ICD-10-CM | POA: Diagnosis present

## 2023-02-03 DIAGNOSIS — I130 Hypertensive heart and chronic kidney disease with heart failure and stage 1 through stage 4 chronic kidney disease, or unspecified chronic kidney disease: Secondary | ICD-10-CM | POA: Diagnosis present

## 2023-02-03 DIAGNOSIS — N1832 Chronic kidney disease, stage 3b: Secondary | ICD-10-CM | POA: Diagnosis present

## 2023-02-03 DIAGNOSIS — L03115 Cellulitis of right lower limb: Secondary | ICD-10-CM

## 2023-02-03 DIAGNOSIS — G2581 Restless legs syndrome: Secondary | ICD-10-CM | POA: Diagnosis present

## 2023-02-03 DIAGNOSIS — J9602 Acute respiratory failure with hypercapnia: Secondary | ICD-10-CM | POA: Diagnosis not present

## 2023-02-03 DIAGNOSIS — I50812 Chronic right heart failure: Secondary | ICD-10-CM | POA: Diagnosis present

## 2023-02-03 DIAGNOSIS — E785 Hyperlipidemia, unspecified: Secondary | ICD-10-CM | POA: Diagnosis present

## 2023-02-03 DIAGNOSIS — I5033 Acute on chronic diastolic (congestive) heart failure: Secondary | ICD-10-CM | POA: Diagnosis present

## 2023-02-03 DIAGNOSIS — W1830XA Fall on same level, unspecified, initial encounter: Secondary | ICD-10-CM | POA: Diagnosis present

## 2023-02-03 DIAGNOSIS — J9622 Acute and chronic respiratory failure with hypercapnia: Secondary | ICD-10-CM | POA: Diagnosis present

## 2023-02-03 DIAGNOSIS — W19XXXA Unspecified fall, initial encounter: Secondary | ICD-10-CM | POA: Diagnosis not present

## 2023-02-03 DIAGNOSIS — J9621 Acute and chronic respiratory failure with hypoxia: Secondary | ICD-10-CM | POA: Diagnosis present

## 2023-02-03 DIAGNOSIS — I890 Lymphedema, not elsewhere classified: Secondary | ICD-10-CM | POA: Diagnosis not present

## 2023-02-03 DIAGNOSIS — I700 Atherosclerosis of aorta: Secondary | ICD-10-CM | POA: Diagnosis present

## 2023-02-03 DIAGNOSIS — G4733 Obstructive sleep apnea (adult) (pediatric): Secondary | ICD-10-CM | POA: Diagnosis present

## 2023-02-03 DIAGNOSIS — E869 Volume depletion, unspecified: Secondary | ICD-10-CM | POA: Diagnosis present

## 2023-02-03 DIAGNOSIS — F419 Anxiety disorder, unspecified: Secondary | ICD-10-CM | POA: Diagnosis present

## 2023-02-03 DIAGNOSIS — J181 Lobar pneumonia, unspecified organism: Secondary | ICD-10-CM | POA: Diagnosis not present

## 2023-02-03 DIAGNOSIS — E1122 Type 2 diabetes mellitus with diabetic chronic kidney disease: Secondary | ICD-10-CM | POA: Diagnosis present

## 2023-02-03 DIAGNOSIS — E1151 Type 2 diabetes mellitus with diabetic peripheral angiopathy without gangrene: Secondary | ICD-10-CM | POA: Diagnosis present

## 2023-02-03 DIAGNOSIS — J189 Pneumonia, unspecified organism: Secondary | ICD-10-CM | POA: Diagnosis not present

## 2023-02-03 DIAGNOSIS — D649 Anemia, unspecified: Secondary | ICD-10-CM | POA: Diagnosis present

## 2023-02-03 DIAGNOSIS — R0603 Acute respiratory distress: Secondary | ICD-10-CM | POA: Diagnosis not present

## 2023-02-03 DIAGNOSIS — I5030 Unspecified diastolic (congestive) heart failure: Secondary | ICD-10-CM | POA: Diagnosis not present

## 2023-02-03 DIAGNOSIS — N179 Acute kidney failure, unspecified: Secondary | ICD-10-CM | POA: Diagnosis not present

## 2023-02-03 DIAGNOSIS — K219 Gastro-esophageal reflux disease without esophagitis: Secondary | ICD-10-CM | POA: Diagnosis present

## 2023-02-03 DIAGNOSIS — J9601 Acute respiratory failure with hypoxia: Secondary | ICD-10-CM | POA: Diagnosis not present

## 2023-02-03 DIAGNOSIS — Z6841 Body Mass Index (BMI) 40.0 and over, adult: Secondary | ICD-10-CM | POA: Diagnosis not present

## 2023-02-03 DIAGNOSIS — L03116 Cellulitis of left lower limb: Secondary | ICD-10-CM | POA: Diagnosis present

## 2023-02-03 LAB — CBC
HCT: 30.6 % — ABNORMAL LOW (ref 36.0–46.0)
Hemoglobin: 10.2 g/dL — ABNORMAL LOW (ref 12.0–15.0)
MCH: 29.4 pg (ref 26.0–34.0)
MCHC: 33.3 g/dL (ref 30.0–36.0)
MCV: 88.2 fL (ref 80.0–100.0)
Platelets: 204 10*3/uL (ref 150–400)
RBC: 3.47 MIL/uL — ABNORMAL LOW (ref 3.87–5.11)
RDW: 14.7 % (ref 11.5–15.5)
WBC: 6.7 10*3/uL (ref 4.0–10.5)
nRBC: 0 % (ref 0.0–0.2)

## 2023-02-03 LAB — BASIC METABOLIC PANEL
Anion gap: 10 (ref 5–15)
BUN: 19 mg/dL (ref 8–23)
CO2: 31 mmol/L (ref 22–32)
Calcium: 8.4 mg/dL — ABNORMAL LOW (ref 8.9–10.3)
Chloride: 96 mmol/L — ABNORMAL LOW (ref 98–111)
Creatinine, Ser: 1.27 mg/dL — ABNORMAL HIGH (ref 0.44–1.00)
GFR, Estimated: 47 mL/min — ABNORMAL LOW (ref >60)
Glucose, Bld: 136 mg/dL — ABNORMAL HIGH (ref 70–99)
Potassium: 3.1 mmol/L — ABNORMAL LOW (ref 3.5–5.1)
Sodium: 137 mmol/L (ref 135–145)

## 2023-02-03 LAB — MISC LABCORP TEST (SEND OUT): Labcorp test code: 81950

## 2023-02-03 LAB — MAGNESIUM: Magnesium: 2.1 mg/dL (ref 1.7–2.4)

## 2023-02-03 LAB — GLUCOSE, CAPILLARY
Glucose-Capillary: 109 mg/dL — ABNORMAL HIGH (ref 70–99)
Glucose-Capillary: 101 mg/dL — ABNORMAL HIGH (ref 70–99)
Glucose-Capillary: 148 mg/dL — ABNORMAL HIGH (ref 70–99)
Glucose-Capillary: 162 mg/dL — ABNORMAL HIGH (ref 70–99)

## 2023-02-03 MED ORDER — INSULIN ASPART 100 UNIT/ML IJ SOLN: 0.0000 [IU] | Freq: Every day | INTRAMUSCULAR | Status: DC

## 2023-02-03 MED ORDER — CEFAZOLIN SODIUM-DEXTROSE 1-4 GM/50ML-% IV SOLN
1.0000 g | Freq: Three times a day (TID) | INTRAVENOUS | Status: DC
  Administered 2023-02-03 – 2023-02-05 (×6): 1 g via INTRAVENOUS
  Filled 2023-02-03 (×9): qty 50

## 2023-02-03 MED ORDER — ALBUTEROL SULFATE (2.5 MG/3ML) 0.083% IN NEBU
3.0000 mL | INHALATION_SOLUTION | Freq: Four times a day (QID) | RESPIRATORY_TRACT | Status: DC | PRN
  Administered 2023-02-03 – 2023-02-06 (×5): 3 mL via RESPIRATORY_TRACT
  Filled 2023-02-03 (×6): qty 3

## 2023-02-03 MED ORDER — INSULIN ASPART 100 UNIT/ML IJ SOLN
0.0000 [IU] | Freq: Three times a day (TID) | INTRAMUSCULAR | Status: DC
  Administered 2023-02-04 (×2): 2 [IU] via SUBCUTANEOUS
  Administered 2023-02-05 (×2): 1 [IU] via SUBCUTANEOUS
  Administered 2023-02-05: 2 [IU] via SUBCUTANEOUS
  Administered 2023-02-06 – 2023-02-08 (×4): 1 [IU] via SUBCUTANEOUS
  Administered 2023-02-08: 3 [IU] via SUBCUTANEOUS
  Administered 2023-02-08: 1 [IU] via SUBCUTANEOUS
  Administered 2023-02-12: 2 [IU] via SUBCUTANEOUS

## 2023-02-03 MED ORDER — POTASSIUM CHLORIDE 20 MEQ PO PACK: 20.0000 meq | PACK | Freq: Two times a day (BID) | ORAL | Status: DC

## 2023-02-03 MED ORDER — POTASSIUM CHLORIDE CRYS ER 20 MEQ PO TBCR
40.0000 meq | EXTENDED_RELEASE_TABLET | Freq: Three times a day (TID) | ORAL | Status: AC
  Administered 2023-02-03 (×3): 40 meq via ORAL
  Filled 2023-02-03 (×3): qty 2

## 2023-02-03 NOTE — Evaluation (Signed)
Physical Therapy Evaluation Patient Details Name: Maria [name] MRN: [id number] DOB: 1956-07-16 Today's Date: 02/03/2023  History of Present Illness  Pt is a 67 y/o female presenting after a fall at home. Negative for acute fx. Admitted for pain control s/p fall, LE edema and LE cellulitis. PMH: chronic lymphadenitis, HTN, CKD, CHF, GERD, DDD, anxiety, DM, HLD, depression  Clinical Impression  Pt was seen for progression from chair to stand and to walk a short trip, as well as to instruct ROM for assisting with recovery of independence.  Pt is from home alone, and struggling with stairs to enter house.  Has created a challenge for EMT's to help her per pt.  Will recommend her to make progress with PT acutely to work toward SNF admission, and to reduce fall risk, increase LE strength, increase standing endurance and to work on quality of gait. Follow for goals of PT as are outlined below.     Recommendations for follow up therapy are one component of a multi-disciplinary discharge planning process, led by the attending physician.  Recommendations may be updated based on patient status, additional functional criteria and insurance authorization.  Follow Up Recommendations Skilled nursing-short term rehab (<3 hours/day) Can patient physically be transported by private vehicle: No    Assistance Recommended at Discharge Frequent or constant Supervision/Assistance  Patient can return home with the following  Two people to help with walking and/or transfers;A lot of help with bathing/dressing/bathroom;Assistance with cooking/housework;Assist for transportation;Help with stairs or ramp for entrance    Equipment Recommendations Rolling walker (2 wheels)  Recommendations for Other Services       Functional Status Assessment Patient has had a recent decline in their functional status and demonstrates the ability to make significant improvements in function in a reasonable and predictable amount of  time.     Precautions / Restrictions Precautions Precautions: Fall Precaution Comments: has B knee and foot pain Restrictions Weight Bearing Restrictions: No      Mobility  Bed Mobility Overal bed mobility: Needs Assistance             General bed mobility comments: up in chair when PT arrived    Transfers Overall transfer level: Needs assistance Equipment used: Rolling walker (2 wheels), 1 person hand held assist Transfers: Sit to/from Stand Sit to Stand: Mod assist                Ambulation/Gait Ambulation/Gait assistance: Min assist Gait Distance (Feet): 4 Feet Assistive device: Rolling walker (2 wheels), 1 person hand held assist Gait Pattern/deviations: Step-to pattern, Decreased stride length, Wide base of support Gait velocity: reduced Gait velocity interpretation: <1.31 ft/sec, indicative of household ambulator Pre-gait activities: pti s up to walk potentially with flexed posture and poor control of legs, cannot depend on knees and are mildly bucking once up General Gait Details: sidesteps only with short trip covered  Science writer    Modified Rankin (Stroke Patients Only)       Balance Overall balance assessment: Needs assistance Sitting-balance support: Feet supported Sitting balance-Leahy Scale: Fair     Standing balance support: Bilateral upper extremity supported Standing balance-Leahy Scale: Poor                               Pertinent Vitals/Pain Pain Assessment Pain Assessment: Faces Faces Pain Scale: Hurts little more Pain Location: feet and knees Pain  Descriptors / Indicators: Grimacing, Guarding Pain Intervention(s): Limited activity within patient's tolerance, Monitored during session, Premedicated before session, Repositioned (waiting for dilaudid when able to get it)    Home Living Family/patient expects to be discharged to:: Skilled nursing facility Living Arrangements:  Alone Available Help at Discharge: Family;Friend(s);Available PRN/intermittently Type of Home: Apartment Home Access: Stairs to enter Entrance Stairs-Rails: Right Entrance Stairs-Number of Steps: 6 steps that are rather uneven, where some are normal steps and some more like a platform. from parking lot.   Home Layout: One level Home Equipment: Rollator (4 wheels);Cane - single point;BSC/3in1;Shower seat;Hand held shower head;Grab bars - tub/shower;Adaptive equipment Additional Comments: pt has lost her mobility after last fall onto her knees    Prior Function Prior Level of Function : Independent/Modified Independent             Mobility Comments: prev using rollator but currently not enough support       Hand Dominance   Dominant Hand: Right    Extremity/Trunk Assessment   Upper Extremity Assessment Upper Extremity Assessment: Defer to OT evaluation    Lower Extremity Assessment Lower Extremity Assessment: Generalized weakness    Cervical / Trunk Assessment Cervical / Trunk Assessment: Kyphotic  Communication   Communication: No difficulties  Cognition Arousal/Alertness: Awake/alert Behavior During Therapy: WFL for tasks assessed/performed Overall Cognitive Status: Within Functional Limits for tasks assessed                                          General Comments General comments (skin integrity, edema, etc.): Pt is in chair when PT arrives and able to assist getting legs down on recliner but not back to elevated at end of session.  Unstable on her feet, in pain and reluctant to stand    Exercises General Exercises - Lower Extremity Ankle Circles/Pumps: AROM, 5 reps Quad Sets: AROM, 10 reps Gluteal Sets: AROM, 10 reps Hip ABduction/ADduction: AROM, 10 reps Other Exercises Other Exercises: hip rotation from long sitting   Assessment/Plan    PT Assessment Patient needs continued PT services  PT Problem List Decreased strength;Decreased  range of motion;Decreased activity tolerance;Decreased balance;Decreased mobility;Decreased coordination;Decreased knowledge of use of DME;Decreased safety awareness;Pain;Decreased skin integrity;Obesity       PT Treatment Interventions DME instruction;Stair training;Gait training;Functional mobility training;Therapeutic activities;Therapeutic exercise;Balance training;Neuromuscular re-education;Patient/family education    PT Goals (Current goals can be found in the Care Plan section)  Acute Rehab PT Goals Patient Stated Goal: to get pain reduced and walk better, better balance PT Goal Formulation: With patient Time For Goal Achievement: 02/17/23 Potential to Achieve Goals: Good    Frequency Min 3X/week     Co-evaluation               AM-PAC PT "6 Clicks" Mobility  Outcome Measure Help needed turning from your back to your side while in a flat bed without using bedrails?: A Lot Help needed moving from lying on your back to sitting on the side of a flat bed without using bedrails?: A Lot Help needed moving to and from a bed to a chair (including a wheelchair)?: A Lot Help needed standing up from a chair using your arms (e.g., wheelchair or bedside chair)?: A Lot Help needed to walk in hospital room?: A Lot Help needed climbing 3-5 steps with a railing? : Total 6 Click Score: 11    End of Session Equipment  Utilized During Treatment: Gait belt Activity Tolerance: Patient limited by fatigue;Patient limited by pain Patient left: in chair;with call bell/phone within reach (no chair alarm available but not attempting to stand alone) Nurse Communication: Mobility status PT Visit Diagnosis: Unsteadiness on feet (R26.81);Muscle weakness (generalized) (M62.81);Repeated falls (R29.6);History of falling (Z91.81);Difficulty in walking, not elsewhere classified (R26.2);Pain Pain - Right/Left:  (Both) Pain - part of body: Knee;Ankle and joints of foot;Leg    Time: [id number] PT Time  Calculation (min) (ACUTE ONLY): 24 min   Charges:   PT Evaluation $PT Eval Moderate Complexity: 1 Mod PT Treatments $Therapeutic Activity: 8-22 mins       Ramond Dial 02/03/2023, 12:47 PM  Mee Hives, PT PhD Acute Rehab Dept. Number: [place]

## 2023-02-03 NOTE — NC FL2 (Signed)
[place] MEDICAID FL2 LEVEL OF CARE FORM     IDENTIFICATION  Patient Name: Maria [name] Birthdate: Aug 28, 1956 Sex: female Admission Date (Current Location): 02/02/2023  [name] and [place] Number:  Herbalist and Address:  [facility] [place], [place] [address], [place], [place] [address]      Provider Number: [id number]  Attending Physician Name and Address:  Aldine Contes, MD  Relative Name and Phone Number:       Current Level of Care: Hospital Recommended Level of Care: [place] Prior Approval Number:    Date Approved/Denied:   PASRR Number:    Discharge Plan: SNF    Current Diagnoses: Patient Active Problem List   Diagnosis Date Noted   Fall 02/02/2023   UTI (urinary tract infection) 07/22/2022   Weakness 07/22/2022   Fluid overload 07/21/2022   Acute cystitis with hematuria    Dyspnea 12/19/2021   Leg swelling 11/20/2021   Bilateral knee pain 10/29/2021   Hypernatremia 09/24/2021   Cellulitis of lower extremity 09/24/2021   CKD (chronic kidney disease), stage IIIa ([place]) 09/24/2021   Sacral decubitus ulcer, stage II ([place]) 09/20/2021   Acute on chronic diastolic CHF (congestive heart failure) ([place]) 09/19/2021   Nail disorder 07/07/2021   Pre-ulcerative corn or callous 07/07/2021   Rash 07/07/2021   Acute kidney injury superimposed on chronic kidney disease ([place]) [id number]   Diastolic congestive heart failure ([place]) 03/17/2021   Diabetes mellitus type 2 in obese ([place]) 03/11/2021   Vitamin D deficiency 01/01/2021   Medial epicondylitis 12/31/2020   Aortic atherosclerosis ([place]) [id number]   Umbilical hernia 123456   Constipation 08/05/2020   Arthritis 06/12/2020   Hoarseness 05/01/2020   Vitamin B 12 deficiency 01/31/2020   Rosacea 01/31/2020   Neck swelling 01/31/2020   History of colonic polyps    Benign neoplasm of [place]    Nausea 08/31/2019   Throat pain 07/26/2019   HLD (hyperlipidemia)  05/24/2019   Leg cramps 05/24/2019   Lump in neck 05/24/2019   Left thyroid nodule 01/25/2019   Jerking movements of extremities 07/20/2018   Balance disorder 07/20/2018   Right knee pain 03/21/2018   OSA (obstructive sleep apnea) 03/21/2018   Oxygen desaturation 03/21/2018   Urinary symptom or sign 03/21/2018   Lymphedema 01/25/2018   Gait disorder 01/25/2018   Dysphonia 12/07/2017   Encounter for well adult exam with abnormal findings 11/08/2017   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   Kiraly polyp 08/11/2016   Abdominal pain, epigastric    Dysphagia    [place] cancer screening    ACE-inhibitor cough 12/05/2015   Restless legs syndrome 10/08/2015   Venous stasis dermatitis of both lower extremities 04/26/2015   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/17/2014   AR (allergic rhinitis) 10/17/2014   Fatty liver 10/17/2014   Chronic pain syndrome 09/19/2014   Post-traumatic osteoarthritis of both knees 09/19/2014   Spinal stenosis 09/19/2014   Depression, major, single episode, complete remission ([place]) 09/19/2014   Narcotic dependence ([place]) 09/19/2014   False positive HIV serology 06/02/2013   Hypokalemia 06/29/2012   Spondylosis of 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 03/09/2012   Lumbar disc disease 03/09/2012   Chronic pain 03/09/2012   Morbid obesity due to excess calories ([place]) 06/17/2011   Chronic low back pain 12/28/2008   Anxiety state 12/06/2007   Depression 12/06/2007   HTN (hypertension) 12/06/2007   Gastroesophageal reflux disease without esophagitis 12/06/2007   LOW BACK PAIN 12/06/2007    Orientation RESPIRATION BLADDER  Height & Weight     Self, Time, Situation, Place  Normal Continent, External catheter Weight: (!) 367 lb 15.2 oz (166.9 kg) Height:  5' 3"$  (160 cm)  BEHAVIORAL SYMPTOMS/MOOD NEUROLOGICAL BOWEL NUTRITION STATUS      Continent Diet (See d/c summary)  AMBULATORY STATUS COMMUNICATION OF NEEDS Skin   Extensive Assist Verbally  Normal                       Personal Care Assistance Level of Assistance  Bathing, Feeding, Dressing Bathing Assistance: Maximum assistance Feeding assistance: Independent Dressing Assistance: Maximum assistance     Functional Limitations Info  Sight, Hearing, Speech Sight Info: Adequate Hearing Info: Adequate Speech Info: Adequate    SPECIAL CARE FACTORS FREQUENCY  PT (By licensed PT), OT (By licensed OT)     PT Frequency: 5x/wk OT Frequency: 5x/wk            Contractures Contractures Info: Not present    Additional Factors Info  Code Status Code Status Info: Full             Current Medications (02/03/2023):  This is the current hospital active medication list Current Facility-Administered Medications  Medication Dose Route Frequency Provider Last Rate Last Admin   acetaminophen (TYLENOL) tablet 1,000 mg  1,000 mg Oral TID Lacinda Axon, MD   1,000 mg at 02/03/23 [id number]   ceFAZolin (ANCEF) IVPB 1 g/50 mL premix  1 g Intravenous Q8H Narendra, Nischal, MD       citalopram (CELEXA) tablet 20 mg  20 mg Oral Daily Lacinda Axon, MD   20 mg at 02/03/23 [id number]   clotrimazole (LOTRIMIN) 1 % cream   Topical BID Lacinda Axon, MD   Given at 02/03/23 1115   enoxaparin (LOVENOX) injection 80 mg  80 mg Subcutaneous Q24H Madueme, Elvira C, RPH   80 mg at 02/03/23 1115   furosemide (LASIX) injection 40 mg  40 mg Intravenous BID Lacinda Axon, MD   40 mg at 02/03/23 [id number]   hydrocerin (EUCERIN) cream   Topical Daily Aldine Contes, MD   Given at 02/03/23 1115   HYDROmorphone (DILAUDID) injection 0.5 mg  0.5 mg Intravenous Q6H PRN Lacinda Axon, MD   0.5 mg at 02/03/23 1338   insulin aspart (novoLOG) injection 0-5 Units  0-5 Units Subcutaneous QHS Serita Butcher, MD       insulin aspart (novoLOG) injection 0-9 Units  0-9 Units Subcutaneous TID WC Serita Butcher, MD       metoprolol tartrate (LOPRESSOR) tablet 25 mg  25 mg Oral Daily Lacinda Axon, MD       ondansetron [facility]) tablet 4 mg  4 mg Oral Q6H PRN Lacinda Axon, MD       Or   ondansetron [facility]) injection 4 mg  4 mg Intravenous Q6H PRN Lacinda Axon, MD       oxyCODONE (Oxy IR/ROXICODONE) immediate release tablet 5 mg  5 mg Oral Q6H PRN Lacinda Axon, MD   5 mg at 02/03/23 1140   pantoprazole (PROTONIX) EC tablet 40 mg  40 mg Oral Daily Angelique Blonder, DO   40 mg at 02/03/23 [id number]   potassium chloride SA (KLOR-CON M) CR tablet 40 mEq  40 mEq Oral TID Virl Axe, MD   40 mEq at 02/03/23 [id number]   pramipexole (MIRAPEX) tablet 0.5 mg  0.5 mg Oral TID Lacinda Axon, MD   0.5 mg at 02/03/23 [phone]  senna-docusate (Senokot-S) tablet 1 tablet  1 tablet Oral QHS PRN Lacinda Axon, MD         Discharge Medications: Please see discharge summary for a list of discharge medications.  Relevant Imaging Results:  Relevant Lab Results:   Additional Information [id number]  Jinger Neighbors, LCSW

## 2023-02-03 NOTE — TOC Initial Note (Signed)
Transition of Care [facility]) - Initial/Assessment Note    Patient Details  Name: Maria [name] MRN: [id number] Date of Birth: December 26, 1955  Transition of [facility]) CM/SW Contact:    Jinger Neighbors, LCSW Phone Number: 02/03/2023, 1:39 PM  Clinical Narrative:                 CSW met with pt at bedside to complete assessment; pt aaox4 and pleasant throughout. Pt lives in an apartment alone. CSW discussed PT recs for SNF; pt in agreement. CSW will continue work up.    Expected Discharge Plan: Skilled Nursing Facility Barriers to Discharge: Continued Medical Work up   Patient Goals and CMS Choice Patient states their goals for this hospitalization and ongoing recovery are:: Pt states she would like to manage her pain and walk without losing her balance. CMS Medicare.gov Compare Post Acute Care list provided to:: Patient Choice offered to / list presented to : Patient [place] ownership interest in [facility].provided to:: Patient    Expected Discharge Plan and Services       Living arrangements for the past 2 months: Apartment                                      Prior Living Arrangements/Services Living arrangements for the past 2 months: Apartment Lives with:: Self Patient language and need for interpreter reviewed:: Yes Do you feel safe going back to the place where you live?: Yes      Need for Family Participation in Patient Care: Yes (Comment) Care giver support system in place?: Yes (comment)   Criminal Activity/Legal Involvement Pertinent to Current Situation/Hospitalization: No - Comment as needed  Activities of Daily Living Home Assistive Devices/Equipment: Walker (specify type), Eyeglasses, Cane (specify quad or straight), Shower chair with back, Grab bars in shower, Bedside commode/3-in-1, Blood pressure cuff, CBG Meter ADL Screening (condition at time of admission) Patient's cognitive ability adequate to safely complete daily activities?: Yes Is  the patient deaf or have difficulty hearing?: No Does the patient have difficulty seeing, even when wearing glasses/contacts?: No Does the patient have difficulty concentrating, remembering, or making decisions?: No Patient able to express need for assistance with ADLs?: Yes Does the patient have difficulty dressing or bathing?: No Independently performs ADLs?: Yes (appropriate for developmental age) Does the patient have difficulty walking or climbing stairs?: Yes Weakness of Legs: Both Weakness of Arms/Hands: Both  Permission Sought/Granted                  Emotional Assessment Appearance:: Appears stated age Attitude/Demeanor/Rapport: Engaged Affect (typically observed): Accepting Orientation: : Oriented to Self, Oriented to Situation, Oriented to Place, Oriented to  Time Alcohol / Substance Use: Not Applicable Psych Involvement: No (comment)  Admission diagnosis:  Chronic right-sided congestive heart failure (HCC) [I50.812] Lymphedema [I89.0] Fall [W19.XXXA] Fall, initial encounter [id number]] Patient Active Problem List   Diagnosis Date Noted   Fall 02/02/2023   UTI (urinary tract infection) 07/22/2022   Weakness 07/22/2022   Fluid overload 07/21/2022   Acute cystitis with hematuria    Dyspnea 12/19/2021   Leg swelling 11/20/2021   Bilateral knee pain 10/29/2021   Hypernatremia 09/24/2021   Cellulitis of lower extremity 09/24/2021   CKD (chronic kidney disease), stage IIIa ([place]) 09/24/2021   Sacral decubitus ulcer, stage II ([place]) 09/20/2021   Acute on chronic diastolic CHF (congestive heart failure) ([place]) 09/19/2021   Nail  disorder 07/07/2021   Pre-ulcerative corn or callous 07/07/2021   Rash 07/07/2021   Acute kidney injury superimposed on chronic kidney disease ([place]) [id number]   Diastolic congestive heart failure ([place]) 03/17/2021   Diabetes mellitus type 2 in obese ([place]) 03/11/2021   Vitamin D deficiency 01/01/2021   Medial epicondylitis 12/31/2020   Aortic  atherosclerosis ([place]) 123456   Umbilical hernia 123456   Constipation 08/05/2020   Arthritis 06/12/2020   Hoarseness 05/01/2020   Vitamin B 12 deficiency 01/31/2020   Rosacea 01/31/2020   Neck swelling 01/31/2020   History of colonic polyps    Benign neoplasm of [place]    Nausea 08/31/2019   Throat pain 07/26/2019   HLD (hyperlipidemia) 05/24/2019   Leg cramps 05/24/2019   Lump in neck 05/24/2019   Left thyroid nodule 01/25/2019   Jerking movements of extremities 07/20/2018   Balance disorder 07/20/2018   Right knee pain 03/21/2018   OSA (obstructive sleep apnea) 03/21/2018   Oxygen desaturation 03/21/2018   Urinary symptom or sign 03/21/2018   Lymphedema 01/25/2018   Gait disorder 01/25/2018   Dysphonia 12/07/2017   Encounter for well adult exam with abnormal findings 11/08/2017   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   Ander polyp 08/11/2016   Abdominal pain, epigastric    Dysphagia    [place] cancer screening    ACE-inhibitor cough 12/05/2015   Restless legs syndrome 10/08/2015   Venous stasis dermatitis of both lower extremities 04/26/2015   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/17/2014   AR (allergic rhinitis) 10/17/2014   Fatty liver 10/17/2014   Chronic pain syndrome 09/19/2014   Post-traumatic osteoarthritis of both knees 09/19/2014   Spinal stenosis 09/19/2014   Depression, major, single episode, complete remission ([place]) 09/19/2014   Narcotic dependence ([place]) 09/19/2014   False positive HIV serology 06/02/2013   Hypokalemia 06/29/2012   Spondylosis of 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 03/09/2012   Lumbar disc disease 03/09/2012   Chronic pain 03/09/2012   Morbid obesity due to excess calories ([place]) 06/17/2011   Chronic low back pain 12/28/2008   Anxiety state 12/06/2007   Depression 12/06/2007   HTN (hypertension) 12/06/2007   Gastroesophageal reflux disease without esophagitis 12/06/2007   LOW BACK PAIN 12/06/2007   PCP:  Carson Myrtle, NP Pharmacy:   [facility] [phone] [place], [place] - [place] [address] Phone: [phone] Fax: [phone]  [facility] [phone] [place], [place] - [place] [address] Phone: [phone] Fax: [place], [place] [address] [place] [address] Phone: [phone] Fax: [phone]  [facility] [address] [place] [address] Phone: [phone] Fax: [phone]     Social Determinants of Health (SDOH) Social History: SDOH Screenings   Food Insecurity: Food Insecurity Present (02/02/2023)  Housing: Low Risk  (02/02/2023)  Transportation Needs: Unmet Transportation Needs (02/02/2023)  Utilities: Not At Risk (02/02/2023)  Alcohol Screen: Low Risk  (12/30/2021)  Depression (PHQ2-9): Medium Risk (01/30/2022)  Financial Resource Strain: Medium Risk (12/30/2021)  Physical Activity: Inactive (12/30/2021)  Social Connections: Socially Isolated (12/30/2021)  Stress: No Stress Concern Present (12/30/2021)  Tobacco Use: Medium Risk (02/02/2023)   SDOH Interventions:     Readmission Risk Interventions    07/22/2022    2:59 PM 04/11/2021   11:40 AM  Readmission Risk Prevention Plan  Transportation Screening Complete Complete  PCP or Specialist Appt within 3-5 Days Complete Complete  HRI or Home Care Consult Complete Complete  Social Work Consult for [place] Planning/Counseling Complete Complete  Palliative Care Screening Complete Not Applicable  Medication Review Press photographer) Complete Complete

## 2023-02-03 NOTE — Evaluation (Signed)
Occupational Therapy Evaluation Patient Details Name: Maria [name] MRN: [id number] DOB: 1956-07-22 Today's Date: 02/03/2023   History of Present Illness Pt is a 67 y/o female presenting after a fall at home. Negative for acute fx. Admitted for pain control s/p fall, LE edema and LE cellulitis. PMH: chronic lymphadenitis, HTN, CKD, CHF, GERD, DDD, anxiety, DM, HLD, depression   Clinical Impression   PTA, pt lives alone, typically Modified Independent with ADLs (using AE), basic IADLs and mobility using Rollator. Pt reports recent falls and says this started around the time she started taking Lyrica. Pt presents now with deficits in strength (especially BLE), cardiopulmonary endurance and balance. Overall, pt requires Mod A to stand with RW and Min A for short mobility to recliner. Pt requires Setup for UB ADLs and Mod A for LB ADLs. Pt easily fatigued but eager to participate. Based on decreased support at home and below functional baseline, would rec SNF rehab at this time. However, with continued OOB activity, pt with potential to progress home with [facility] therapies.       Recommendations for follow up therapy are one component of a multi-disciplinary discharge planning process, led by the attending physician.  Recommendations may be updated based on patient status, additional functional criteria and insurance authorization.   Follow Up Recommendations  Skilled nursing-short term rehab (<3 hours/day) (though pt hopeful to progress to [facility])     Assistance Recommended at Discharge Intermittent Supervision/Assistance  Patient can return home with the following A lot of help with walking and/or transfers;A lot of help with bathing/dressing/bathroom;Assistance with cooking/housework;Assist for transportation;Help with stairs or ramp for entrance    Functional Status Assessment  Patient has had a recent decline in their functional status and demonstrates the ability to make significant improvements  in function in a reasonable and predictable amount of time.  Equipment Recommendations  None recommended by OT    Recommendations for Other Services       Precautions / Restrictions Precautions Precautions: Fall Restrictions Weight Bearing Restrictions: No      Mobility Bed Mobility Overal bed mobility: Needs Assistance Bed Mobility: Supine to Sit     Supine to sit: Supervision, HOB elevated          Transfers Overall transfer level: Needs assistance Equipment used: Rolling walker (2 wheels), Rollator (4 wheels) Transfers: Sit to/from Stand Sit to Stand: Mod assist           General transfer comment: Overall Mod A to stand with L hand pushing from bedrail. Initially trialed Rollator though pt not stable with this and trialed RW x 2 with pt reporting increasing LE strength with practice. able to ambulate 5-58f around foot of bed to recliner including side stepping as air mattress unit too long for RW to fit through      Balance Overall balance assessment: Needs assistance Sitting-balance support: No upper extremity supported, Feet supported Sitting balance-Leahy Scale: Fair     Standing balance support: Bilateral upper extremity supported, During functional activity Standing balance-Leahy Scale: Poor                             ADL either performed or assessed with clinical judgement   ADL Overall ADL's : Needs assistance/impaired Eating/Feeding: Independent   Grooming: Set up;Sitting;Wash/dry face   Upper Body Bathing: Set up   Lower Body Bathing: Moderate assistance;Sit to/from stand   Upper Body Dressing : Set up   Lower Body Dressing:  Moderate assistance;Sit to/from stand Lower Body Dressing Details (indicate cue type and reason): assist for socks, does not wear socks at baseline though. Toilet Transfer: Moderate assistance;Rolling walker (2 wheels)   Toileting- Clothing Manipulation and Hygiene: Moderate assistance;Sitting/lateral  lean;Sit to/from stand       Functional mobility during ADLs: Minimal assistance;Rolling walker (2 wheels) General ADL Comments: Pt with soreness from fall, increased assist needed to stand and reports feeling very weak in BLE increasing fall risk during ADLs     Vision Baseline Vision/History: 1 Wears glasses Ability to See in Adequate Light: 0 Adequate Patient Visual Report: No change from baseline Vision Assessment?: No apparent visual deficits     Perception     Praxis      Pertinent Vitals/Pain Pain Assessment Pain Assessment: Faces Faces Pain Scale: Hurts little more Pain Location: knees Pain Descriptors / Indicators: Grimacing, Guarding Pain Intervention(s): Limited activity within patient's tolerance, Monitored during session     Hand Dominance Right   Extremity/Trunk Assessment Upper Extremity Assessment Upper Extremity Assessment: Overall WFL for tasks assessed   Lower Extremity Assessment Lower Extremity Assessment: Defer to PT evaluation   Cervical / Trunk Assessment Cervical / Trunk Assessment: Kyphotic   Communication Communication Communication: No difficulties   Cognition Arousal/Alertness: Awake/alert Behavior During Therapy: WFL for tasks assessed/performed Overall Cognitive Status: Within Functional Limits for tasks assessed                                       General Comments  bed soaked in urine on entry despite purewick in place. With difficulty standing with RW, educated on plan to trial Stedy. however, no Stedy noted in [facility] equipment room. No chair alarm in room either    Exercises     Shoulder Instructions      Home Living Family/patient expects to be discharged to:: Private residence Living Arrangements: Alone Available Help at Discharge: Family;Friend(s);Available PRN/intermittently Type of Home: Apartment Home Access: Stairs to enter Entrance Stairs-Number of Steps: 6 steps that are rather uneven, where some are  normal steps and some more like a platform. from parking lot. Entrance Stairs-Rails: Right Home Layout: One level     Bathroom Shower/Tub: Tub/shower unit;Curtain   Biochemist, clinical: Standard     Home Equipment: Rollator (4 wheels);Cane - single point;BSC/3in1;Shower seat;Hand held shower head;Grab bars - tub/shower;Adaptive equipment Adaptive Equipment: Reacher;Sock aid;Long-handled shoe horn;Long-handled sponge        Prior Functioning/Environment Prior Level of Function : Independent/Modified Independent             Mobility Comments: use of rollator ADLs Comments: Indep with adaptive equipment, uses shower chair (sits over tub edge onto chair to swing legs over), can do basic IADLs        OT Problem List: Decreased strength;Decreased activity tolerance;Impaired balance (sitting and/or standing);Pain      OT Treatment/Interventions: Self-care/ADL training;Therapeutic exercise;Energy conservation;DME and/or AE instruction;Therapeutic activities    OT Goals(Current goals can be found in the care plan section) Acute Rehab OT Goals Patient Stated Goal: be able to increase strength, decrease falls and return home OT Goal Formulation: With patient Time For Goal Achievement: 02/17/23 Potential to Achieve Goals: Good  OT Frequency: Min 2X/week    Co-evaluation              AM-PAC OT "6 Clicks" Daily Activity     Outcome Measure Help from another person eating meals?:  None Help from another person taking care of personal grooming?: A Little Help from another person toileting, which includes using toliet, bedpan, or urinal?: A Lot Help from another person bathing (including washing, rinsing, drying)?: A Lot Help from another person to put on and taking off regular upper body clothing?: A Little Help from another person to put on and taking off regular lower body clothing?: A Lot 6 Click Score: 16   End of Session Equipment Utilized During Treatment: Rolling walker (2  wheels);Gait belt Nurse Communication: Mobility status  Activity Tolerance: Patient tolerated treatment well Patient left: in chair;with call bell/phone within reach;Other (comment) (chair pad under pt though no chair alarm box in room. bed soaked with urine so did not want to assist pt back to bed and pt eager to sit up)  OT Visit Diagnosis: Unsteadiness on feet (R26.81);Other abnormalities of gait and mobility (R26.89);Muscle weakness (generalized) (M62.81)                Time: [id number] OT Time Calculation (min): 45 min Charges:  OT General Charges $OT Visit: 1 Visit OT Evaluation $OT Eval Low Complexity: 1 Low OT Treatments $Self Care/Home Management : 8-22 mins $Therapeutic Activity: 8-22 mins  Malachy Chamber, OTR/L Acute Rehab Services Office: [phone]   Layla Maw 02/03/2023, 8:32 AM

## 2023-02-03 NOTE — Hospital Course (Addendum)
She feels really good today. Her breathing feels great and she is happy with how her legs are looking now. She has been using the incentive spirometry regularly. Discussed reaching back out to SNF now that she is doing so much better.

## 2023-02-03 NOTE — TOC PASRR Note (Addendum)
.  re: Maria [name] Date of birth: 11-Mar-1956 Date: 02/03/2022  TO WHOM IT MAY CONCERN:  Please be advised that the above named patient will require a short term nursing home stay, anticipated 30 days or less for rehabilitation and strengthening. The plan is for return home.

## 2023-02-03 NOTE — TOC Initial Note (Signed)
Transition of Care [facility]) - Initial/Assessment Note    Patient Details  Name: Maria [name] MRN: [id number] Date of Birth: [birth year]  Transition of Care [facility]) CM/SW Contact:    Jinger Neighbors, LCSW Phone Number: 02/03/2023, 3:17 PM  Clinical Narrative:                  CSW met with pt at bedside to complete assessment. Pt AAOx4 and very pleasant. Pt appears motivated and accepting to go to SNF for STR. CSW completed work up; however, due to AMI, CSW completed 30 day note and FL2. Due to [facility] MUST technical issues, CSW unable to upload additional docs. CSW will try again tomorrow.   Expected Discharge Plan: Skilled Nursing Facility Barriers to Discharge: Continued Medical Work up   Patient Goals and CMS Choice Patient states their goals for this hospitalization and ongoing recovery are:: Pt states she would like to manage her pain and walk without losing her balance. CMS Medicare.gov Compare Post Acute Care list provided to:: Patient Choice offered to / list presented to : Patient [place] ownership interest in [facility].provided to:: Patient    Expected Discharge Plan and Services       Living arrangements for the past 2 months: Apartment                                      Prior Living Arrangements/Services Living arrangements for the past 2 months: Apartment Lives with:: Self Patient language and need for interpreter reviewed:: Yes Do you feel safe going back to the place where you live?: Yes      Need for Family Participation in Patient Care: Yes (Comment) Care giver support system in place?: Yes (comment)   Criminal Activity/Legal Involvement Pertinent to Current Situation/Hospitalization: No - Comment as needed  Activities of Daily Living Home Assistive Devices/Equipment: Walker (specify type), Eyeglasses, Cane (specify quad or straight), Shower chair with back, Grab bars in shower, Bedside commode/3-in-1, Blood pressure cuff, CBG Meter ADL  Screening (condition at time of admission) Patient's cognitive ability adequate to safely complete daily activities?: Yes Is the patient deaf or have difficulty hearing?: No Does the patient have difficulty seeing, even when wearing glasses/contacts?: No Does the patient have difficulty concentrating, remembering, or making decisions?: No Patient able to express need for assistance with ADLs?: Yes Does the patient have difficulty dressing or bathing?: No Independently performs ADLs?: Yes (appropriate for developmental age) Does the patient have difficulty walking or climbing stairs?: Yes Weakness of Legs: Both Weakness of Arms/Hands: Both  Permission Sought/Granted                  Emotional Assessment Appearance:: Appears stated age Attitude/Demeanor/Rapport: Engaged Affect (typically observed): Accepting Orientation: : Oriented to Self, Oriented to Situation, Oriented to Place, Oriented to  Time Alcohol / Substance Use: Not Applicable Psych Involvement: No (comment)  Admission diagnosis:  Chronic right-sided congestive heart failure (HCC) [I50.812] Lymphedema [I89.0] Fall [W19.XXXA] Fall, initial encounter [id number]] Patient Active Problem List   Diagnosis Date Noted   Fall 02/02/2023   UTI (urinary tract infection) 07/22/2022   Weakness 07/22/2022   Fluid overload 07/21/2022   Acute cystitis with hematuria    Dyspnea 12/19/2021   Leg swelling 11/20/2021   Bilateral knee pain 10/29/2021   Hypernatremia 09/24/2021   Cellulitis of lower extremity 09/24/2021   CKD (chronic kidney disease), stage IIIa ([place]) 09/24/2021  Sacral decubitus ulcer, stage II ([place]) 09/20/2021   Acute on chronic diastolic CHF (congestive heart failure) ([place]) 09/19/2021   Nail disorder 07/07/2021   Pre-ulcerative corn or callous 07/07/2021   Rash 07/07/2021   Acute kidney injury superimposed on chronic kidney disease ([place]) [id number]   Diastolic congestive heart failure ([place]) 03/17/2021    Diabetes mellitus type 2 in obese ([place]) 03/11/2021   Vitamin D deficiency 01/01/2021   Medial epicondylitis 12/31/2020   Aortic atherosclerosis ([place]) 123456   Umbilical hernia 123456   Constipation 08/05/2020   Arthritis 06/12/2020   Hoarseness 05/01/2020   Vitamin B 12 deficiency 01/31/2020   Rosacea 01/31/2020   Neck swelling 01/31/2020   History of colonic polyps    Benign neoplasm of [place]    Nausea 08/31/2019   Throat pain 07/26/2019   HLD (hyperlipidemia) 05/24/2019   Leg cramps 05/24/2019   Lump in neck 05/24/2019   Left thyroid nodule 01/25/2019   Jerking movements of extremities 07/20/2018   Balance disorder 07/20/2018   Right knee pain 03/21/2018   OSA (obstructive sleep apnea) 03/21/2018   Oxygen desaturation 03/21/2018   Urinary symptom or sign 03/21/2018   Lymphedema 01/25/2018   Gait disorder 01/25/2018   Dysphonia 12/07/2017   Encounter for well adult exam with abnormal findings 11/08/2017   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   [place]en polyp 08/11/2016   Abdominal pain, epigastric    Dysphagia    [place] cancer screening    ACE-inhibitor cough 12/05/2015   Restless legs syndrome 10/08/2015   Venous stasis dermatitis of both lower extremities 04/26/2015   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/17/2014   AR (allergic rhinitis) 10/17/2014   Fatty liver 10/17/2014   Chronic pain syndrome 09/19/2014   Post-traumatic osteoarthritis of both knees 09/19/2014   Spinal stenosis 09/19/2014   Depression, major, single episode, complete remission ([place]) 09/19/2014   Narcotic dependence ([place]) 09/19/2014   False positive HIV serology 06/02/2013   Hypokalemia 06/29/2012   Spondylosis of 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 03/09/2012   Lumbar disc disease 03/09/2012   Chronic pain 03/09/2012   Morbid obesity due to excess calories ([place]) 06/17/2011   Chronic low back pain 12/28/2008   Anxiety state 12/06/2007   Depression 12/06/2007   HTN  (hypertension) 12/06/2007   Gastroesophageal reflux disease without esophagitis 12/06/2007   LOW BACK PAIN 12/06/2007   PCP:  Carson Myrtle, NP Pharmacy:   [facility] [phone] [address] [place], [place] [address] [place] [address] Phone: [phone] Fax: [phone]  [facility] [phone] [address], [place] - [address] [place] [address] Phone: [phone] Fax: [place], [place] [address] [place] [address] Phone: [phone] Fax: [phone]  [facility], [address] [place] [address] Phone: [phone] Fax: [phone]     Social Determinants of Health (SDOH) Social History: SDOH Screenings   Food Insecurity: Food Insecurity Present (02/02/2023)  Housing: Low Risk  (02/02/2023)  Transportation Needs: Unmet Transportation Needs (02/02/2023)  Utilities: Not At Risk (02/02/2023)  Alcohol Screen: Low Risk  (12/30/2021)  Depression (PHQ2-9): Medium Risk (01/30/2022)  Financial Resource Strain: Medium Risk (12/30/2021)  Physical Activity: Inactive (12/30/2021)  Social Connections: Socially Isolated (12/30/2021)  Stress: No Stress Concern Present (12/30/2021)  Tobacco Use: Medium Risk (02/02/2023)   SDOH  Interventions:     Readmission Risk Interventions    07/22/2022    2:59 PM 04/11/2021   11:40 AM  Readmission Risk Prevention Plan  Transportation Screening Complete Complete  PCP or Specialist Appt within 3-5 Days Complete Complete  HRI or [place] Complete Complete  Social Work Consult for [place] Planning/Counseling Complete Complete  Palliative Care Screening Complete Not Applicable  Medication Review Press photographer) Complete Complete

## 2023-02-03 NOTE — Progress Notes (Signed)
Internal Medicine Attending:   I saw and examined the patient. I reviewed the resident's H&P note and I agree with the resident's findings and plan as documented in the resident's note.  In brief, patient is 67 year old female with past medical history of chronic lymphedema, morbid obesity, hypertension, CKD stage IIIb, chronic diastolic heart failure, GERD, degenerative disc disease, anxiety and depression, type 2 diabetes and hyperlipidemia who presented to the ED after a fall at home.  Patient states that she has noted progressive bilateral lower extremity swelling over the last couple of weeks associated with pain and has had 2 falls in the last week.  Patient denies any syncopal episodes and states that the falls were mechanical.  Patient also noted worsening redness in her lower extremities as well.  No fevers or chills, no chest pain, no shortness of breath.  Today, the patient states that she feels a little better and states that the swelling in her legs are going down.  She did complain of weakness in both her legs which is slowly improving.  Vitals:   02/03/23 0722 02/03/23 0723  BP: (!) 109/49   Pulse: 71 70  Resp: 18   Temp:  97.8 F (36.6 C)  SpO2: 96% 96%    On exam, patient is sitting up in the chair in no apparent distress.  Lungs are clear to auscultation bilaterally.  Cardiovascular Sam reveals regular rate and rhythm with normal heart sounds.  Abdomen is soft, nontender, nondistended with normoactive bowel sounds.  Patient with 2+ bilateral lower extremity pitting edema up to the knees.  Both legs are wrapped.  Some ecchymosis is noted above the left knee.  Right lower extremity is tender to palpation below the right knee.  Patient has a normal mood and affect and is oriented x 3.  Assessment and plan:  1.  Acute on chronic diastolic heart failure exacerbation with right lower extremity cellulitis: -Patient presented to ED with a fall in the setting of progressively worsening  bilateral lower extremity swelling with increased pain and redness over her right lower extremity.  Patient is afebrile and has no leukocytosis. -Patient was started on ceftriaxone for possible right lower extremity cellulitis.  Currently, her lower extremities are wrapped and we will reevaluate her legs this afternoon when they change the dressings -Will transition patient off ceftriaxone to IV cefazolin to treat her cellulitis -Continue with Unna boots for now -Continue strict I's and O's and daily weights -Patient was net negative approximately 2.7 L over last 24 hours -She still has significant bilateral lower extremity swelling.  Will continue with Lasix 40 mg IV twice daily for now -Monitor BMP daily while being diuresed -PT/OT to evaluate patient.  Will follow-up recommendations.  Suspect patient will likely need to go to SNF -No further workup at this time

## 2023-02-04 DIAGNOSIS — I881 Chronic lymphadenitis, except mesenteric: Secondary | ICD-10-CM

## 2023-02-04 DIAGNOSIS — I50812 Chronic right heart failure: Secondary | ICD-10-CM | POA: Diagnosis not present

## 2023-02-04 DIAGNOSIS — Z87891 Personal history of nicotine dependence: Secondary | ICD-10-CM

## 2023-02-04 DIAGNOSIS — L03116 Cellulitis of left lower limb: Secondary | ICD-10-CM | POA: Diagnosis not present

## 2023-02-04 DIAGNOSIS — L03115 Cellulitis of right lower limb: Secondary | ICD-10-CM | POA: Diagnosis not present

## 2023-02-04 DIAGNOSIS — W19XXXA Unspecified fall, initial encounter: Secondary | ICD-10-CM | POA: Diagnosis not present

## 2023-02-04 LAB — BASIC METABOLIC PANEL WITH GFR
Anion gap: 9 (ref 5–15)
BUN: 19 mg/dL (ref 8–23)
CO2: 30 mmol/L (ref 22–32)
Calcium: 8 mg/dL — ABNORMAL LOW (ref 8.9–10.3)
Chloride: 99 mmol/L (ref 98–111)
Creatinine, Ser: 1.39 mg/dL — ABNORMAL HIGH (ref 0.44–1.00)
GFR, Estimated: 42 mL/min — ABNORMAL LOW
Glucose, Bld: 162 mg/dL — ABNORMAL HIGH (ref 70–99)
Potassium: 3.5 mmol/L (ref 3.5–5.1)
Sodium: 138 mmol/L (ref 135–145)

## 2023-02-04 LAB — GLUCOSE, CAPILLARY
Glucose-Capillary: 169 mg/dL — ABNORMAL HIGH (ref 70–99)
Glucose-Capillary: 151 mg/dL — ABNORMAL HIGH (ref 70–99)
Glucose-Capillary: 91 mg/dL (ref 70–99)
Glucose-Capillary: 150 mg/dL — ABNORMAL HIGH (ref 70–99)

## 2023-02-04 MED ORDER — CLONAZEPAM 0.5 MG PO TBDP
1.0000 mg | ORAL_TABLET | Freq: Once | ORAL | Status: AC
  Administered 2023-02-04: 1 mg via ORAL
  Filled 2023-02-04: qty 2

## 2023-02-04 MED ORDER — GUAIFENESIN 100 MG/5ML PO LIQD
5.0000 mL | ORAL | Status: DC | PRN
  Administered 2023-02-04: 5 mL via ORAL
  Filled 2023-02-04: qty 15

## 2023-02-04 MED ORDER — IPRATROPIUM-ALBUTEROL 0.5-2.5 (3) MG/3ML IN SOLN
3.0000 mL | Freq: Once | RESPIRATORY_TRACT | Status: AC
  Administered 2023-02-04: 3 mL via RESPIRATORY_TRACT
  Filled 2023-02-04: qty 3

## 2023-02-04 NOTE — Progress Notes (Signed)
Patient BP 109/41 this morning. Patient has metoprolol tartrate 25 mg ordered this morning. Per Dr. Dareen Piano to hold medication this morning.

## 2023-02-04 NOTE — Progress Notes (Addendum)
IMTS paged about wheezing and chest tightness that comes on with cough. Per RN, O2 saturation dropped to 85% and placed on 2 L [place].   Exam showed mild expiratory wheezing more on right. Non-labored breathing on 2 L [place]. Patient appeared anxious.   Restarted home albuterol PRN with some improvement. Turned supplemental oxygen off with saturation down to 91-92%. Will give duoneb once and robitussin PRN. Gave one dose of home Klonopin for anxiety. Will notify day team.

## 2023-02-04 NOTE — TOC Progression Note (Addendum)
Transition of Care [facility]) - Progression Note    Patient Details  Name: Maria [name] MRN: [id number] Date of Birth: 12/27/1955  Transition [facility]) CM/SW Contact  Jinger Neighbors, [place] Phone Number: 02/04/2023, 11:09 AM  Clinical Narrative:     CSW reviewed and printed bed offers. CSW presented bed offers to pt and she chose [facility]. CSW attempted to contact [facility] [place], no answer. CSW messaged [employer] to request auth.  Pt was approved for SNF at [facility]. CSW [employer], awaiting a returned message/call.  CSW attempted multiple times to upload additional docs in [place]; no success, each time received an error message. CSW went back to Social Work office and faxed to [facility] MUST.  CSW received a returned call from [facility] at [facility] stating they do not have have a bed currently for pt's weight and ordered one. Hopefully bed will be in tomorrow; Perrin Smack will keep CSW updated.  Expected Discharge Plan: Skilled Nursing Facility Barriers to Discharge: Continued Medical Work up  Expected Discharge Plan and Services       Living arrangements for the past 2 months: Apartment                                       Social Determinants of Health (SDOH) Interventions SDOH Screenings   Food Insecurity: Food Insecurity Present (02/02/2023)  Housing: Low Risk  (02/02/2023)  Transportation Needs: Unmet Transportation Needs (02/02/2023)  Utilities: Not At Risk (02/02/2023)  Alcohol Screen: Low Risk  (12/30/2021)  Depression (PHQ2-9): Medium Risk (01/30/2022)  Financial Resource Strain: Medium Risk (12/30/2021)  Physical Activity: Inactive (12/30/2021)  Social Connections: Socially Isolated (12/30/2021)  Stress: No Stress Concern Present (12/30/2021)  Tobacco Use: Medium Risk (02/02/2023)    Readmission Risk Interventions    07/22/2022    2:59 PM 04/11/2021   11:40 AM  Readmission Risk Prevention Plan  Transportation Screening Complete Complete  PCP or Specialist Appt within 3-5  Days Complete Complete  HRI or [place] Complete Complete  Social Work Consult for [place] Planning/Counseling Complete Complete  Palliative Care Screening Complete Not Applicable  Medication Review Press photographer) Complete Complete

## 2023-02-04 NOTE — Progress Notes (Addendum)
Patient noted to be on RA saturating at 75% SpO2. Patient placed on 2LNC saturating at 97% SpO2. Patient denies any SOB or chest pain at this time. MD team made aware

## 2023-02-04 NOTE — Progress Notes (Addendum)
Subjective:  Patient was feeling okay this morning, much better than last night during wheezing episode. She was breathing comfortably without supplemental oxygen. Reports increased cough over last several weeks. Discussed plan to continue diuretics, antibiotics, and physical therapy until discharge.   Objective: Vitals over previous 24hr: Vitals:   02/04/23 0500 02/04/23 0600 02/04/23 0601 02/04/23 0738  BP:    (!) 109/41  Pulse:    78  Resp:    20  Temp:    99.3 F (37.4 C)  TempSrc:    Oral  SpO2: 95% (!) 89% 93% 93%  Weight: (!) 166.7 kg     Height:        General:      awake and alert, lying comfortably in bed, cooperative, not in acute distress Skin:       warm and dry, bilateral lower extremities wrapped in compressive dressings from toes to below knees Lungs:      normal respiratory effort, breathing slightly labored, symmetrical chest rise, no crackles or wheezing  Cardiac:      regular rate and rhythm, normal S1 and S2 Abdomen:      soft and non-distended, normoactive bowel sounds, no tenderness to palpation or guarding Neurologic:      oriented to person-place-time, moving all extremities, no gross focal deficits Psychiatric:      euthymic mood with congruent affect, intelligible speech  Photographs from 2-14:      Assessment/Plan: Ms Berrian is a 67 year old female with a past medical history of morbid obesity, hypertension, hyperlipidemia, chronic diastolic heart failure, chronic lymphadenitis, CKD IIIb, and DDD who was delivered to the ED after a mechanical fall at home, now admitted for management of lower extremity cellulitis and chronic lymphadenitis.    ---Lower extremity cellulitis ---Chronic lymphadenitis Patient has a history of lymphadenitis and morbid obesity. During her most recent hospitalization, her legs were placed in an Unna boot and she was discharged with home health. She presented on 2-13 with lower extremity swelling and erythema,  which has worsened over the last few weeks. Exam upon arrival to the ED notable for significant lymphedema complicated by bilateral lower extremity cellulitis right worse than left. Vitals including temperature were unremarkable and laboratory findings without leukocytosis. Lymphedema likely has component of venous insufficiency secondary to morbid obesity and physical inactivity, heart failure probably contributing as well. Primary team will treat cellulitis with intravenous antibiotics and then transition to an oral regimen upon discharge. > Cefazolin 1g IV q8, then cephalexin PO on discharge, last dose 2-20 > Unna boot > Consider lymphedema clinic referral upon discharge   ---Chronic diastolic heart failure Patient has history of diastolic heart failure, most recent echocardiogram performed 09-2021 demonstrated ejection fraction 65-70% and grade II diastolic dysfunction. She reports chronic dyspnea on exertion without resting breath shortness or orthopnea. Upon arrival, chest radiograph negative for vascular congestion and BNP within normal limits. Although the patient takes torsemide 80m at home, she demonstrated confusion about proper dosing and has only been taking one dose daily rather than two as prescribed. Heart failure is likely contributing to lower extremity swelling. Intravenous furosemide started upon arrival, net fluid loss since admission 4.8L. > Furosemide 483mIV q12 > Potassium chloride 4032mq8 > Monitor ins-outs > Trend BMP q24   ---Mechanical fall ---Physical deconditioning Patient was delivered to [facility] after experiencing a mechanical fall without loss of consciousness or prodromal symptoms. Upon arrival, imaging was negative for acute intracranial process and thoracic-lumbar fracture, but identified mild-moderate degenerative  spondylosis. Vitamin D level within normal limits. Etiology of falls is likely multifactorial specifically pain, increased lower extremity swelling,  and physical deconditioning. Occupational and physical therapy teams are recommending discharge to skilled nursing facility for rehabilitation before returning home. > Transitions of care consult for SNF placement > Acetaminophen 1060m q8 > Oxycodone 533mq6 PRN, moderate-severe pain > Hydromorphone 0.31m31m6 PRN, breakthrough pain > Fall precautions > [facility]   ---Breath shortness with wheezing Overnight on 2-14 patient developed acute worsening of breath shortness and exam revealed mild expiratory wheezing. Patient also reports increased coughing over the last several weeks. One dose of ipratropium-albuterol was administered and patient subsequently placed on 2L/min oxygen. Her SpO2 values improved immediately thereafter and have been within the normal range on room air since 2-15. Patient would likely benefit from further diagnostic workup for possible asthma or COPD in outpatient setting. > Albuterol 3mL731m PRN > Consider PFT evaluation as outpatient   ---Hypertension [facility] of [facility] Since arrival, systolic blood pressure has been stable within 110-130 range. > Metoprolol 231mg64m   ---Chronic kidney disease IIIb Patient has history of chronic kidney disease with baseline creatinine roughly around 1.1-1.3 per electronic medical record. Upon arrival, her creatinine was 1.31 and potassium 3.8.  Creatinine has remained stable throughout current hospitalization. > Trend BMP q24 > Avoid nephrotoxic agents   ---Diabetes II ---Obesity III Patient has history of morbid obesity and diabetes, her current BMI is 64. Previous A1C collected 01-2022 was 6.9. Managed at home with dapagliflozin. Upon arrival, her A1C was 6.5. Blood glucose has remained in the 100-200 range throughout current hospitalization. > Sliding scale insulin, sensitive sensitivity  > Dapagliflozin 31mg q10m  ---Thyroid  multinodular mass Patient has history of thyroid nodule. Fine-needle aspiration of left lower thyroid lobe demonstrated benign hyperplastic nodule classified as Bethesda category 2. Most recent TSH level was 2.35 collected 07-2022. > Monitor clinically   ---Intertrigo Patient has history of morbid obesity and abdominal pannus. Upon arrival, erythematous patch was present in her inguinal region.  > Clotrimazole cream 1% q12   ---Anxiety ---Depression Patient has history of anxiety and depression managed at home with citalopram. > Citalopram 20mg q53m ---Restless legs syndrome Patient has a history of restless legs syndrome managed at home with pramipexole. > Pramipexole 0.31mg q8 70m Principal Problem:   Fall Active Problems:   Lymphedema   Chronic right-sided congestive heart failure (HCC)    [place] to Admission Living Arrangement:  Anticipated Discharge Location: pending Barriers to Discharge: symptom treatment, physical evaluation Dispo: Anticipated discharge in approximately 1 day(s).    Dan HarpRoswell Nickelernal Medicine PGY-1 Pager [phone]  [phone] on weekdays and 1pm on weekends: On Call pager [phone]

## 2023-02-05 ENCOUNTER — Inpatient Hospital Stay (HOSPITAL_COMMUNITY): Payer: 59

## 2023-02-05 DIAGNOSIS — J9602 Acute respiratory failure with hypercapnia: Secondary | ICD-10-CM | POA: Diagnosis not present

## 2023-02-05 DIAGNOSIS — I130 Hypertensive heart and chronic kidney disease with heart failure and stage 1 through stage 4 chronic kidney disease, or unspecified chronic kidney disease: Secondary | ICD-10-CM

## 2023-02-05 DIAGNOSIS — N1832 Chronic kidney disease, stage 3b: Secondary | ICD-10-CM

## 2023-02-05 DIAGNOSIS — J9601 Acute respiratory failure with hypoxia: Secondary | ICD-10-CM | POA: Diagnosis not present

## 2023-02-05 DIAGNOSIS — J189 Pneumonia, unspecified organism: Secondary | ICD-10-CM | POA: Diagnosis not present

## 2023-02-05 DIAGNOSIS — W19XXXA Unspecified fall, initial encounter: Secondary | ICD-10-CM | POA: Diagnosis not present

## 2023-02-05 DIAGNOSIS — I5033 Acute on chronic diastolic (congestive) heart failure: Secondary | ICD-10-CM

## 2023-02-05 LAB — CBC WITH DIFFERENTIAL/PLATELET
Abs Immature Granulocytes: 0.03 10*3/uL (ref 0.00–0.07)
Basophils Absolute: 0 10*3/uL (ref 0.0–0.1)
Basophils Relative: 0 %
Eosinophils Absolute: 0.1 10*3/uL (ref 0.0–0.5)
Eosinophils Relative: 1 %
HCT: 33.4 % — ABNORMAL LOW (ref 36.0–46.0)
Hemoglobin: 10.5 g/dL — ABNORMAL LOW (ref 12.0–15.0)
Immature Granulocytes: 0 %
Lymphocytes Relative: 14 %
Lymphs Abs: 1.3 10*3/uL (ref 0.7–4.0)
MCH: 29.2 pg (ref 26.0–34.0)
MCHC: 31.4 g/dL (ref 30.0–36.0)
MCV: 93 fL (ref 80.0–100.0)
Monocytes Absolute: 0.6 10*3/uL (ref 0.1–1.0)
Monocytes Relative: 6 %
Neutro Abs: 7.4 10*3/uL (ref 1.7–7.7)
Neutrophils Relative %: 79 %
Platelets: 216 10*3/uL (ref 150–400)
RBC: 3.59 MIL/uL — ABNORMAL LOW (ref 3.87–5.11)
RDW: 15.3 % (ref 11.5–15.5)
WBC: 9.4 10*3/uL (ref 4.0–10.5)
nRBC: 0 % (ref 0.0–0.2)

## 2023-02-05 LAB — GLUCOSE, CAPILLARY
Glucose-Capillary: 143 mg/dL — ABNORMAL HIGH (ref 70–99)
Glucose-Capillary: 162 mg/dL — ABNORMAL HIGH (ref 70–99)
Glucose-Capillary: 133 mg/dL — ABNORMAL HIGH (ref 70–99)
Glucose-Capillary: 148 mg/dL — ABNORMAL HIGH (ref 70–99)
Glucose-Capillary: 173 mg/dL — ABNORMAL HIGH (ref 70–99)

## 2023-02-05 LAB — BASIC METABOLIC PANEL
Anion gap: 10 (ref 5–15)
BUN: 13 mg/dL (ref 8–23)
CO2: 32 mmol/L (ref 22–32)
Calcium: 8 mg/dL — ABNORMAL LOW (ref 8.9–10.3)
Chloride: 99 mmol/L (ref 98–111)
Creatinine, Ser: 1.06 mg/dL — ABNORMAL HIGH (ref 0.44–1.00)
GFR, Estimated: 58 mL/min — ABNORMAL LOW (ref >60)
Glucose, Bld: 146 mg/dL — ABNORMAL HIGH (ref 70–99)
Potassium: 3.8 mmol/L (ref 3.5–5.1)
Sodium: 141 mmol/L (ref 135–145)

## 2023-02-05 MED ORDER — AZITHROMYCIN 250 MG PO TABS
500.0000 mg | ORAL_TABLET | Freq: Every day | ORAL | Status: AC
  Administered 2023-02-05 – 2023-02-07 (×3): 500 mg via ORAL
  Filled 2023-02-05: qty 2
  Filled 2023-02-05 (×2): qty 1

## 2023-02-05 MED ORDER — IPRATROPIUM-ALBUTEROL 0.5-2.5 (3) MG/3ML IN SOLN
3.0000 mL | Freq: Four times a day (QID) | RESPIRATORY_TRACT | Status: DC
  Administered 2023-02-05 – 2023-02-12 (×27): 3 mL via RESPIRATORY_TRACT
  Filled 2023-02-05 (×26): qty 3

## 2023-02-05 MED ORDER — SODIUM CHLORIDE 0.9 % IV SOLN
1.0000 g | INTRAVENOUS | Status: AC
  Administered 2023-02-05 – 2023-02-08 (×4): 1 g via INTRAVENOUS
  Filled 2023-02-05 (×5): qty 10

## 2023-02-05 MED ORDER — CLONAZEPAM 0.5 MG PO TABS
1.0000 mg | ORAL_TABLET | Freq: Two times a day (BID) | ORAL | Status: DC | PRN
  Administered 2023-02-05 – 2023-02-07 (×5): 1 mg via ORAL
  Filled 2023-02-05 (×3): qty 2
  Filled 2023-02-05: qty 1
  Filled 2023-02-05: qty 2

## 2023-02-05 MED ORDER — POTASSIUM CHLORIDE 20 MEQ PO PACK
40.0000 meq | PACK | Freq: Two times a day (BID) | ORAL | Status: DC
  Administered 2023-02-05 – 2023-02-09 (×10): 40 meq via ORAL
  Filled 2023-02-05 (×10): qty 2

## 2023-02-05 NOTE — TOC Progression Note (Signed)
Transition of Care [facility]) - Progression Note    Patient Details  Name: Maria [name] MRN: [id number] Date of Birth: July 17, 1956  Transition of Care [facility]) CM/SW Contact  Jinger Neighbors, [place] Phone Number: 02/05/2023, 10:51 AM  Clinical Narrative:     per MD, pt has developed a new pneumonia and will need to stay an additional day or two. CSW updated [facility] at [facility] and stated pt will have to admit on Monday  Expected Discharge Plan: [place] Barriers to Discharge: Continued Medical Work up  Expected Discharge Plan and Services       Living arrangements for the past 2 months: Apartment                                       Social Determinants of Health (SDOH) Interventions SDOH Screenings   Food Insecurity: Food Insecurity Present (02/02/2023)  Housing: Low Risk  (02/02/2023)  Transportation Needs: Unmet Transportation Needs (02/02/2023)  Utilities: Not At Risk (02/02/2023)  Alcohol Screen: Low Risk  (12/30/2021)  Depression (PHQ2-9): Medium Risk (01/30/2022)  Financial Resource Strain: Medium Risk (12/30/2021)  Physical Activity: Inactive (12/30/2021)  Social Connections: Socially Isolated (12/30/2021)  Stress: No Stress Concern Present (12/30/2021)  Tobacco Use: Medium Risk (02/02/2023)    Readmission Risk Interventions    07/22/2022    2:59 PM 04/11/2021   11:40 AM  Readmission Risk Prevention Plan  Transportation Screening Complete Complete  PCP or Specialist Appt within 3-5 Days Complete Complete  HRI or [place] Complete Complete  Social Work Consult for [place] Planning/Counseling Complete Complete  Palliative Care Screening Complete Not Applicable  Medication Review Press photographer) Complete Complete

## 2023-02-05 NOTE — TOC Progression Note (Signed)
Transition of Care (TOC) - Progression Note   PASRR: [id number]  Patient Details  Name: Kamaiya Hug Ron MRN: [id number] Date of Birth: 1956/09/21  Transition of Care [facility]) CM/SW Contact  Jinger Neighbors, [place] Phone Number: 02/05/2023, 8:22 AM  Clinical Narrative:       Expected Discharge Plan: Skilled Nursing Facility Barriers to Discharge: Continued Medical Work up  Expected Discharge Plan and Services       Living arrangements for the past 2 months: Apartment                                       Social Determinants of Health (SDOH) Interventions SDOH Screenings   Food Insecurity: Food Insecurity Present (02/02/2023)  Housing: Low Risk  (02/02/2023)  Transportation Needs: Unmet Transportation Needs (02/02/2023)  Utilities: Not At Risk (02/02/2023)  Alcohol Screen: Low Risk  (12/30/2021)  Depression (PHQ2-9): Medium Risk (01/30/2022)  Financial Resource Strain: Medium Risk (12/30/2021)  Physical Activity: Inactive (12/30/2021)  Social Connections: Socially Isolated (12/30/2021)  Stress: No Stress Concern Present (12/30/2021)  Tobacco Use: Medium Risk (02/02/2023)    Readmission Risk Interventions    07/22/2022    2:59 PM 04/11/2021   11:40 AM  Readmission Risk Prevention Plan  Transportation Screening Complete Complete  PCP or Specialist Appt within 3-5 Days Complete Complete  HRI or [place] Complete Complete  Social Work Consult for [place] Planning/Counseling Complete Complete  Palliative Care Screening Complete Not Applicable  Medication Review Press photographer) Complete Complete

## 2023-02-05 NOTE — Progress Notes (Addendum)
Patient noted to have increased work of breathing this morning. Patient was saturating at 86% on 2L with temperature of 100.7. patient increased to 5L [place] saturating at 93-94% SpO2. Patient given tylenol and breathing treatment. MD made aware.

## 2023-02-05 NOTE — Progress Notes (Addendum)
Subjective:  Patient was feeling okay this morning, but woke up this morning significantly shot of breath. During encounter, she was breathing supplemental oxygen at flow rate of 5L/min. Discussed chest radiograph findings including new diagnosis of pneumonia and communicated plan to start additional antibiotics.    Objective: Vitals over previous 24hr: Vitals:   02/04/23 1703 02/04/23 2041 02/05/23 0529 02/05/23 0813  BP: 113/63 (!) 91/42  121/62  Pulse:  82 88 81  Resp: 20 16 18 18  $ Temp: 99.4 F (37.4 C) 98.7 F (37.1 C) 98.7 F (37.1 C) (!) 100.7 F (38.2 C)  TempSrc: Oral Oral Oral Oral  SpO2: 97% 92% 91% 93%  Weight:      Height:        General:      awake and alert, lying in bed, cooperative, not in acute distress Skin:       warm and dry, bilateral lower extremities wrapped in compressive dressings from toes to below knees Lungs:      normal respiratory effort, breathing slightly labored, symmetrical chest rise, distant but diffusely course breath sounds worse in lower lobes bilaterally Cardiac:      regular rate and rhythm, normal S1 and S2, trace pitting edema below knees bilaterally Abdomen:      soft and non-distended, normoactive bowel sounds, no tenderness to palpation or guarding Neurologic:      oriented to person-place-time, moving all extremities, no gross focal deficits Psychiatric:      euthymic mood with anxious affect, intelligible speech  Photographs from 2-16:      Assessment/Plan: Ms Behring is a 67 year old female with a past medical history of morbid obesity, hypertension, hyperlipidemia, chronic diastolic heart failure, chronic lymphadenitis, CKD IIIb, and DDD who was delivered to the ED after a mechanical fall at home, now admitted for management of lower extremity cellulitis and chronic lymphadenitis.    ---Lower extremity cellulitis ---Chronic lymphadenitis Patient has a history of lymphadenitis and morbid obesity. During her most  recent hospitalization, her legs were placed in an Unna boot and she was discharged with home health. She presented on 2-13 with lower extremity swelling and erythema, which has worsened over the last few weeks. Exam upon arrival to the ED notable for significant lymphedema complicated by bilateral lower extremity cellulitis right worse than left. Vitals including temperature were unremarkable and laboratory findings without leukocytosis. Lymphedema likely has component of venous insufficiency secondary to morbid obesity and physical inactivity, heart failure probably contributing as well. Cefazolin started and then switched to ceftriaxone plus azithromycin on 2-16 for pneumonia coverage. > Ceftriaxone 1g q24 > Azithromycin 557m q24 > Unna boot > Consider lymphedema clinic referral upon discharge   ---Chronic diastolic heart failure Patient has history of diastolic heart failure, most recent echocardiogram performed 09-2021 demonstrated ejection fraction 65-70% and grade II diastolic dysfunction. She reports chronic dyspnea on exertion without resting breath shortness or orthopnea. Upon arrival, chest radiograph negative for vascular congestion and BNP within normal limits. Although the patient takes torsemide 654mat home, she demonstrated confusion about proper dosing and has only been taking one dose daily rather than two as prescribed. Heart failure is likely contributing to lower extremity swelling. Intravenous furosemide started upon arrival, net fluid loss since admission 5.3L. > Furosemide 4061mV q12 > Potassium chloride 89m89m12 > Monitor ins-outs > Trend BMP q24   ---Mechanical fall ---Physical deconditioning [facility] experiencing a mechanical fall without loss of consciousness or prodromal symptoms. Upon  arrival, imaging was negative for acute intracranial process and thoracic-lumbar fracture, but identified mild-moderate degenerative spondylosis. Vitamin D  level within normal limits. Etiology of falls is likely multifactorial specifically pain, increased lower extremity swelling, and physical deconditioning. Occupational and physical therapy teams are recommending discharge to skilled nursing facility for rehabilitation before returning home. > Transitions of care consult for SNF placement > Acetaminophen 1052m q8 > Oxycodone 528mq6 PRN, moderate-severe pain > Hydromorphone 0.52m37m6 PRN, breakthrough pain > Fall precautions > [facility]   ---Left lower lobe pneumonia Upon arrival, chest radiograph notable only for enlarged cardiac silhouette without evidence of consolidation. Overnight on 2-14 patient developed acute worsening of breath shortness and exam revealed mild expiratory wheezing. Patient also reports increased coughing over the last several weeks. One dose of ipratropium-albuterol was administered and patient placed on 2L/min oxygen. Her SpO2 values initially improved, but she desaturated again on morning of 2-16 and required 5L/min supplemental oxygen. Chest radiograph demonstrated bilateral interstitial and alveolar airspace opacities concerning for multilobar pneumonia, which is new since admission. Antibiotic regimen expanded from cefazolin to ceftriaxone plus azithromycin for pneumonia coverage. > Ceftriaxone 1g q24 > Azithromycin 500m66m4 > Supplemental oxygen as needed to maintain SpO2>90 > Albuterol 3mL 78mPRN   ---[facility]pertension [facility]. Since arrival, systolic blood pressure has been stable within 110-130 range. > Metoprolol 252mg 252m  ---Chronic kidney disease IIIb Patient has history of chronic kidney disease with baseline creatinine roughly around 1.1-1.3 per electronic medical record. Upon arrival, her creatinine was 1.31 and potassium 3.8.  Creatinine has remained stable throughout current hospitalization. >  Trend BMP q24 > Avoid nephrotoxic agents   ---Diabetes II ---Obesity III Patient has history of morbid obesity and diabetes, her current BMI is 64. Previous A1C collected 01-2022 was 6.9. Managed at home with dapagliflozin. Upon arrival, her A1C was 6.5. Blood glucose has remained in the 100-200 range throughout current hospitalization. > Sliding scale insulin, sensitive sensitivity  > Dapagliflozin 52mg q247m ---Thyroid multinodular mass Patient has history of thyroid nodule. Fine-needle aspiration of left lower thyroid lobe demonstrated benign hyperplastic nodule classified as Bethesda category 2. Most recent TSH level was 2.35 collected 07-2022. > Monitor clinically   ---Intertrigo Patient has history of morbid obesity and abdominal pannus. Upon arrival, erythematous patch was present in her inguinal region.  > Clotrimazole cream 1% q12   ---Anxiety ---Depression Patient has history of anxiety and depression managed at home with citalopram. She also takes clonazepam twice daily as needed for anxiety. > Citalopram 20mg q2652mClonazepam 1mg q12 652m   ---Restless legs syndrome Patient has a history of restless legs syndrome managed at home with pramipexole. > Pramipexole 0.52mg q8   63mrincipal Problem:   Fall Active Problems:   Lymphedema   Chronic right-sided congestive heart failure (HCC)    PrHendricks to Admission Living Arrangement:  Anticipated Discharge Location: pending Barriers to Discharge: symptom treatment, physical evaluation Dispo: Anticipated discharge in approximately 2-3 day(s).    Dan HarperRoswell Nickelnal Medicine PGY-1 Pager [phone]  [phone] on weekdays and 1pm on weekends: On Call pager [phone]

## 2023-02-05 NOTE — Progress Notes (Signed)
Patient transferred from [facility] via bed at 1845PM with 5 L [place]. Patient is alert and oriented

## 2023-02-05 NOTE — Discharge Summary (Incomplete)
Name: Maria [name] MRN: [id number] DOB: 1956-09-21 67 y.o. PCP: Carson Myrtle, NP  Date of Admission: 02/02/2023  6:52 AM Date of Discharge: 02/05/2023 Attending Physician: Aldine Contes, MD  Discharge Diagnosis: Principal Problem:   Fall Active Problems:   Lymphedema   Chronic right-sided congestive heart failure [facility])     Discharge Medications: Allergies as of 02/05/2023       Reactions   Amoxicillin-pot Clavulanate Nausea And Vomiting   Projectile vomiting Did it involve swelling of the face/tongue/throat, SOB, or low BP? No Did it involve sudden or severe rash/hives, skin peeling, or any reaction on the inside of your mouth or nose? No Did you need to seek medical attention at a hospital or doctor's office? No When did it last happen?      20-30 years If all above answers are "NO", may proceed with cephalosporin use.   Adhesive [tape] Other (See Comments)   Tears skin off - use paper tape    Codeine Other (See Comments)   hallucinations   Crestor [rosuvastatin Calcium]    Severe muscle cramps   Lyrica [pregabalin] Other (See Comments)   weakness   Morphine And Related Nausea And Vomiting, Other (See Comments)   Migraine headaches   Vicodin [hydrocodone-acetaminophen] Other (See Comments)   hallucinations   Latex Rash     Med Rec must be completed prior to using this [place]***        Disposition and follow-up:    Ms.Maria [name] was discharged from [facility] in Stable condition.  At the hospital follow up visit please address:   1.  Check volume status and lower extremity wounds, ensuring resolution of symptoms including pain  2.  Verify completion of antibiotic course and correct torsemide dose at home  3. Labs / imaging needed at time of follow-up: BMP for Cr and K  4.  Pending labs / tests needing follow-up: none    Follow-up Appointments:   Hospital Course by problem list:  Ms Maria [name] is a 67 year old female  with a past medical history of morbid obesity, hypertension, hyperlipidemia, chronic diastolic heart failure, chronic lymphadenitis, CKD IIIb, and DDD who was delivered to the ED after a mechanical fall at home, now admitted for management of lower extremity cellulitis and chronic lymphadenitis.       ---Lower extremity cellulitis ---Chronic lymphadenitis Patient has a history of lymphadenitis and morbid obesity. During her most recent hospitalization, her legs were placed in an Unna boot and she was discharged with home health. She presented on 2-13 with lower extremity swelling and erythema, which has worsened over the last few weeks. Exam upon arrival to the ED notable for significant lymphedema complicated by bilateral lower extremity cellulitis right worse than left. Vitals including temperature were unremarkable and laboratory findings without leukocytosis. Lymphedema likely has component of venous insufficiency secondary to morbid obesity and physical inactivity, heart failure probably contributing as well. Antibiotic regimen of intravenous cephalexin was initiated with subsequent improvement in symptoms. Transitioned to oral cephalexin upon discharge, last dose scheduled for 2-20. Discharged on xxx > Cefazolin 1g IV q8, then cephalexin PO on discharge, last dose 2-20 > Unna boot > Consider lymphedema clinic referral upon discharge     ---Chronic diastolic heart failure Patient has history of diastolic heart failure, most recent echocardiogram performed 09-2021 demonstrated ejection fraction 65-70% and grade II diastolic dysfunction. She reports chronic dyspnea on exertion without resting breath shortness or orthopnea. Upon arrival, chest radiograph negative for  vascular congestion and BNP within normal limits. Although the patient takes torsemide 53m at home, she demonstrated confusion about proper dosing and has only been taking one dose daily rather than two as prescribed. Heart failure is  likely contributing to lower extremity swelling. Intravenous furosemide started upon arrival, net fluid loss since admission 4.8L. Discharged with torsemide 641monce daily. > Furosemide 4014mV q12 > Potassium chloride 58m71m8 > Monitor ins-outs > Trend BMP q24     ---Mechanical fall ---Physical deconditioning [facility] of consciousness or prodromal symptoms. Upon arrival, imaging was negative for acute intracranial process and thoracic-lumbar fracture, but identified mild-moderate degenerative spondylosis. Vitamin D level within normal limits. Etiology of falls is likely multifactorial specifically pain, increased lower extremity swelling, and physical deconditioning. [facility]. > Transitions of care consult for SNF placement > Acetaminophen 1000mg59m> Oxycodone 5mg q55mRN, moderate-severe pain > Hydromorphone 0.5mg q625mN, breakthrough pain > Fall precautions > [facility]     ---Breath shortness with wheezing Overnight on 2-14 patient developed acute worsening of breath shortness and exam revealed mild expiratory wheezing. Patient also reports increased coughing over the last several weeks. One dose of ipratropium-albuterol was administered and patient subsequently placed on 2L/min oxygen. Xxx Patient would likely benefit from further diagnostic workup for possible asthma or COPD in outpatient setting. > Albuterol 3mL q6 79m > Consider PFT evaluation as outpatient     ---Hypertension [facility] Since arrival, systolic blood pressure has been stable within 110-130 range. > Metoprolol 25mg q2428m ---Chronic kidney disease IIIb Patient has history of chronic kidney disease with baseline creatinine roughly around 1.1-1.3 per  electronic medical record. Upon arrival, her creatinine was 1.31 and potassium 3.8.  Creatinine has remained stable throughout current hospitalization. > Trend BMP q24 > Avoid nephrotoxic agents     ---Diabetes II ---Obesity III Patient has history of morbid obesity and diabetes, her current BMI is 64. Previous A1C collected 01-2022 was 6.9. Managed at home with dapagliflozin. Upon arrival, her A1C was 6.5. Blood glucose has remained in the 100-200 range throughout current hospitalization. > Sliding scale insulin, sensitive sensitivity  > Dapagliflozin 5mg q24  30m---Thyroid multinodular mass Patient has history of thyroid nodule. Fine-needle aspiration of left lower thyroid lobe demonstrated benign hyperplastic nodule classified as Bethesda category 2. Most recent TSH level was 2.35 collected 07-2022. > Monitor clinically     ---Intertrigo Patient has history of morbid obesity and abdominal pannus. Upon arrival, erythematous patch was present in her inguinal region.  > Clotrimazole cream 1% q12     ---Anxiety ---Depression Patient has history of anxiety and depression managed at home with citalopram. > Citalopram 20mg q24  54m--Restless legs syndrome Patient has a history of restless legs syndrome managed at home with pramipexole. > Pramipexole 0.5mg q8    D79mharge Exam:    BP (!) 91/42 (BP Location: Left Arm)   Pulse 88   Temp 98.7 F (37.1 C) (Oral)   Resp 18   Ht 5' 3"$  (1.6 m)   Wt (!) 166.7 kg   SpO2 91%   BMI 65.10 kg/m   Subjective: Patient reports ***  ***   Pertinent Labs, Studies, and Procedures:   Labs: *** ______________________  Imaging: ***  ______________________  Procedures: ***  ______________________   Discharge Instructions:  Dear Ms Sawtelle,  Your leg swelling, pain, and frequent falls were caused by fluid accumulating in your legs. Several factors have been contributing to this fluid accumulation including obesity, physical  inactivity, lymphedema, and heart failure. We treated your leg swelling with diuretics and removed over 5L of fluid during your stay. You still have a lot of leg swelling, however, and will need to complete additional physical therapy for strength and endurance training before returning home.   While you were in the hospital, we also discovered that your legs had a skin infection. We treated this with antibiotics, which you will continue to take for a few days after you leave the hospital.   If you experience worsening leg swelling, breath shortness, redness, or pain then please return to the emergency department.  We wish you the best of luck with rehabilitation at [facility]!    Signed: [place] Nickel, MD Internal Medicine PGY-1 Pager [phone]

## 2023-02-05 NOTE — Progress Notes (Signed)
OT Cancellation Note  Patient Details Name: Maria [name] MRN: [id number] DOB: [birth year]   Cancelled Treatment:    Reason Eval/Treat Not Completed: Other (comment) pt with visitors present, pt reports likely to DC to SNF Monday and wanting to rest over the weekend, will continue to follow acutely per POC.   Harley Alto., COTA/L Acute Rehabilitation Services [phone]   Precious Haws 02/05/2023, 1:42 PM

## 2023-02-05 NOTE — Progress Notes (Signed)
PT Cancellation Note  Patient Details Name: Maria [name] MRN: [id number] DOB: Oct 15, 1956   Cancelled Treatment:    Reason Eval/Treat Not Completed: Medical issues which prohibited therapy..  Pt is declining over medical changes with PNA, will retry as time and pt allow.   Ramond Dial 02/05/2023, 1:56 PM  Mee Hives, PT PhD Acute Rehab Dept. Number: [place]

## 2023-02-06 DIAGNOSIS — J181 Lobar pneumonia, unspecified organism: Secondary | ICD-10-CM | POA: Diagnosis not present

## 2023-02-06 DIAGNOSIS — I890 Lymphedema, not elsewhere classified: Secondary | ICD-10-CM | POA: Diagnosis not present

## 2023-02-06 DIAGNOSIS — L03119 Cellulitis of unspecified part of limb: Secondary | ICD-10-CM

## 2023-02-06 DIAGNOSIS — J9601 Acute respiratory failure with hypoxia: Secondary | ICD-10-CM | POA: Diagnosis not present

## 2023-02-06 DIAGNOSIS — W19XXXA Unspecified fall, initial encounter: Secondary | ICD-10-CM | POA: Diagnosis not present

## 2023-02-06 LAB — BASIC METABOLIC PANEL
Anion gap: 7 (ref 5–15)
BUN: 14 mg/dL (ref 8–23)
CO2: 34 mmol/L — ABNORMAL HIGH (ref 22–32)
Calcium: 8.3 mg/dL — ABNORMAL LOW (ref 8.9–10.3)
Chloride: 99 mmol/L (ref 98–111)
Creatinine, Ser: 1.1 mg/dL — ABNORMAL HIGH (ref 0.44–1.00)
GFR, Estimated: 56 mL/min — ABNORMAL LOW (ref >60)
Glucose, Bld: 132 mg/dL — ABNORMAL HIGH (ref 70–99)
Potassium: 4.8 mmol/L (ref 3.5–5.1)
Sodium: 140 mmol/L (ref 135–145)

## 2023-02-06 LAB — CBC
HCT: 31.9 % — ABNORMAL LOW (ref 36.0–46.0)
Hemoglobin: 10 g/dL — ABNORMAL LOW (ref 12.0–15.0)
MCH: 29.3 pg (ref 26.0–34.0)
MCHC: 31.3 g/dL (ref 30.0–36.0)
MCV: 93.5 fL (ref 80.0–100.0)
Platelets: 212 10*3/uL (ref 150–400)
RBC: 3.41 MIL/uL — ABNORMAL LOW (ref 3.87–5.11)
RDW: 15.4 % (ref 11.5–15.5)
WBC: 9.4 10*3/uL (ref 4.0–10.5)
nRBC: 0 % (ref 0.0–0.2)

## 2023-02-06 LAB — POCT I-STAT 7, (LYTES, BLD GAS, ICA,H+H)
Acid-Base Excess: 7 mmol/L — ABNORMAL HIGH (ref 0.0–2.0)
Bicarbonate: 33.5 mmol/L — ABNORMAL HIGH (ref 20.0–28.0)
Calcium, Ion: 1.2 mmol/L (ref 1.15–1.40)
HCT: 33 % — ABNORMAL LOW (ref 36.0–46.0)
Hemoglobin: 11.2 g/dL — ABNORMAL LOW (ref 12.0–15.0)
O2 Saturation: 94 %
Potassium: 4.6 mmol/L (ref 3.5–5.1)
Sodium: 138 mmol/L (ref 135–145)
TCO2: 35 mmol/L — ABNORMAL HIGH (ref 22–32)
pCO2 arterial: 54.1 mmHg — ABNORMAL HIGH (ref 32–48)
pH, Arterial: 7.4 (ref 7.35–7.45)
pO2, Arterial: 72 mmHg — ABNORMAL LOW (ref 83–108)

## 2023-02-06 LAB — BLOOD GAS, ARTERIAL
Acid-Base Excess: 10.2 mmol/L — ABNORMAL HIGH (ref 0.0–2.0)
Acid-Base Excess: 13.6 mmol/L — ABNORMAL HIGH (ref 0.0–2.0)
Bicarbonate: 37.3 mmol/L — ABNORMAL HIGH (ref 20.0–28.0)
Bicarbonate: 40.3 mmol/L — ABNORMAL HIGH (ref 20.0–28.0)
O2 Saturation: 97.5 %
O2 Saturation: 98.1 %
Patient temperature: 37
Patient temperature: 37
pCO2 arterial: 63 mmHg — ABNORMAL HIGH (ref 32–48)
pCO2 arterial: 58 mmHg — ABNORMAL HIGH (ref 32–48)
pH, Arterial: 7.38 (ref 7.35–7.45)
pH, Arterial: 7.45 (ref 7.35–7.45)
pO2, Arterial: 81 mmHg — ABNORMAL LOW (ref 83–108)
pO2, Arterial: 72 mmHg — ABNORMAL LOW (ref 83–108)

## 2023-02-06 LAB — GLUCOSE, CAPILLARY
Glucose-Capillary: 110 mg/dL — ABNORMAL HIGH (ref 70–99)
Glucose-Capillary: 150 mg/dL — ABNORMAL HIGH (ref 70–99)
Glucose-Capillary: 129 mg/dL — ABNORMAL HIGH (ref 70–99)
Glucose-Capillary: 106 mg/dL — ABNORMAL HIGH (ref 70–99)

## 2023-02-06 MED ORDER — DM-GUAIFENESIN ER 30-600 MG PO TB12
1.0000 | ORAL_TABLET | Freq: Two times a day (BID) | ORAL | Status: DC
  Administered 2023-02-06 – 2023-02-12 (×13): 1 via ORAL
  Filled 2023-02-06 (×14): qty 1

## 2023-02-06 MED ORDER — HYDROMORPHONE HCL 1 MG/ML IJ SOLN
0.5000 mg | Freq: Four times a day (QID) | INTRAMUSCULAR | Status: DC | PRN
  Administered 2023-02-06 – 2023-02-08 (×7): 0.5 mg via INTRAVENOUS
  Filled 2023-02-06 (×7): qty 0.5

## 2023-02-06 NOTE — Progress Notes (Signed)
RT called to Pt room due to Pt in distress with low O2. RT assessed Pt and Pt sats 88% on 5L [place],  increased WOB and accessory muscle use. Pt placed on venturi mask and an ABG was drawn. Pt placed on bipap per order and is currently tolerating it well at this time.

## 2023-02-06 NOTE — Progress Notes (Addendum)
Subjective:  Patient was feeling short of breath this morning and needed some extra oxygen. She also reported more coughing than yesterday. During encounter, she was breathing supplemental oxygen at flow rate of 5-6L/min. Discussed plan to continue antibiotics and breathing medications. Counseled patient on relaxation techniques such as deep nasal breathing. Shortly after initial encounter, patient developed acute breath shortness that briefly required non-rebreather face mask and has since transitioned back to nasal cannula.    Objective: Vitals over previous 24hr: Vitals:   02/06/23 1120 02/06/23 1126 02/06/23 1127 02/06/23 1137  BP:    134/65  Pulse:    74  Resp:    20  Temp:    (!) 97.3 F (36.3 C)  TempSrc:    Axillary  SpO2: (!) 74% 94% 90% 93%  Weight:      Height:        General:      awake and alert, lying in bed, cooperative, experienced mild physical distress upon sitting upright and breathing deeply Skin:       warm and dry, bilateral lower extremities wrapped in compressive dressings from toes to below knees Lungs:      normal respiratory effort, breathing slightly labored, symmetrical chest rise, distant diffusely course breath sounds most prominent in lower lobes bilaterally Cardiac:      regular rate and rhythm, normal S1 and S2, trace pitting edema below knees bilaterally Abdomen:      soft and non-distended, normoactive bowel sounds, no tenderness to palpation or guarding Neurologic:      oriented to person-place-time, moving all extremities, no gross focal deficits Psychiatric:      anxious mood with congruent affect, intelligible speech    Assessment/Plan: Ms Boudoin is a 67 year old female with a past medical history of morbid obesity, hypertension, hyperlipidemia, chronic diastolic heart failure, chronic lymphadenitis, CKD IIIb, and DDD who was delivered to the ED after a mechanical fall at home, now admitted for management of lower extremity cellulitis  and chronic lymphadenitis.    ---Left lower lobe pneumonia ---Acute hypoxic and hypercapnic respiratory failure Upon arrival, chest radiograph notable only for enlarged cardiac silhouette without evidence of consolidation. Overnight on 2-14 patient developed acute worsening of breath shortness and exam revealed mild expiratory wheezing. Patient also reports increased coughing over the last several weeks. One dose of ipratropium-albuterol was administered and patient placed on 2L/min oxygen. Her SpO2 values initially improved, but she desaturated again on morning of 2-16 and required 5L/min supplemental oxygen. Chest radiograph demonstrated bilateral interstitial and alveolar airspace opacities concerning for multilobar pneumonia, which was new since admission. Antibiotic regimen expanded from cefazolin to ceftriaxone plus azithromycin for pneumonia coverage. On 2-17, oxygen requirements increased to 12L/min and ipratropium-albuterol added for additional respiratory support. Arterial blood gas notable for high pCO2, low pO2, and elevated Bicarb. Given evidence of poor gas exchange and CO2 retention, patient would likely benefit from BiPAP. > Ceftriaxone 1g q24 > Azithromycin 552m q24 > Ipratropium-albuterol 0.5-2.514mq6 > Albuterol 72m45m6 PRN > Supplemental oxygen as needed to maintain SpO2>90 > Chest physiotherapy and flutter valve > Trial BiPAP on 2-17   ---Lower extremity cellulitis ---Chronic lymphadenitis Patient has a history of lymphadenitis and morbid obesity. During her most recent hospitalization, her legs were placed in an Unna boot and she was discharged with home health. She presented on 2-13 with lower extremity swelling and erythema, which has worsened over the last few weeks. Exam upon arrival to the ED notable for significant lymphedema complicated by bilateral  lower extremity cellulitis right worse than left. Vitals including temperature were unremarkable and laboratory findings  without leukocytosis. Lymphedema likely has component of venous insufficiency secondary to morbid obesity and physical inactivity, heart failure probably contributing as well. Cefazolin started and then switched to ceftriaxone plus azithromycin on 2-16 for additional pneumonia coverage. Area of involvement and swelling have both improved considerably since starting treatment. > Ceftriaxone 1g q24 > Azithromycin 579m q24 > Unna boot > Consider lymphedema clinic referral upon discharge   ---Chronic diastolic heart failure Patient has history of diastolic heart failure, most recent echocardiogram performed 09-2021 demonstrated ejection fraction 65-70% and grade II diastolic dysfunction. She reports chronic dyspnea on exertion without orthopnea or breath shortness at rest. Upon arrival, chest radiograph negative for vascular congestion and BNP within normal limits. Although the patient takes torsemide 688mat home, she demonstrated confusion about proper dosing and has only been taking one dose daily rather than two as prescribed. Heart failure is likely contributing to lower extremity swelling. Intravenous furosemide precipitated net fluid loss 5.3L and was discontinued on 2-16 after improvement of edema.  > Potassium chloride 4030mq12 > Monitor ins-outs > Trend BMP q24   ---Mechanical fall ---Physical deconditioning Patient was delivered to [facility] after experiencing a mechanical fall without loss of consciousness or prodromal symptoms. Upon arrival, imaging was negative for acute intracranial process and thoracic-lumbar fracture, but identified mild-moderate degenerative spondylosis. Vitamin D level within normal limits. Etiology of falls is likely multifactorial including pain, increased lower extremity swelling, and physical deconditioning. Occupational and physical therapy teams are recommending discharge to skilled nursing facility for rehabilitation before returning home. > Transitions of care  consult for SNF placement > Acetaminophen 1000m73m > Oxycodone 5mg 66mPRN, moderate-severe pain > Hydromorphone 0.5mg q62mRN, breakthrough pain > Fall precautions > Physical-occupational therapy > [facility]   ---Hypertension [facility] of [facility] Since [facility], her blood pressure has been stable on home metoprolol dose. > Metoprolol 25mg q16m ---Chronic kidney disease IIIb Patient has history of chronic kidney disease with baseline creatinine roughly around 1.1-1.3 per electronic medical record. Upon arrival, her creatinine was 1.31 and potassium 3.8.  Creatinine has remained stable throughout current hospitalization. > Trend BMP q24 > Avoid nephrotoxic agents   ---Anxiety ---Depression Patient has history of anxiety and depression managed at home with citalopram. She also takes clonazepam twice daily as needed. Anxiety is likely a contributing factor to her respiratory distress, counseling was provided on deep-breathing relaxation techniques. > Citalopram 20mg q242mClonazepam 1mg q12 83m   ---Diabetes II ---Obesity III Patient has history of morbid obesity and diabetes, her current BMI is 64. Previous A1C collected 01-2022 was 6.9. Managed at home with dapagliflozin. Upon arrival, her A1C was 6.5. Blood glucose has remained in the 100-200 range throughout current hospitalization. > Sliding scale insulin, sensitive sensitivity  > Dapagliflozin 5mg q24  57m-Intertrigo Patient has history of morbid obesity and abdominal pannus. Upon arrival, erythematous patch was present in her inguinal region.  > Clotrimazole cream 1% q12   ---Restless legs syndrome Patient has a history of restless legs syndrome managed at home with pramipexole. > Pramipexole 0.5mg q8   -52mhyroid multinodular mass Patient has history of thyroid nodule. Fine-needle aspiration of left lower thyroid lobe  demonstrated benign hyperplastic nodule classified as Bet[place]da category 2. Most recent TSH level was 2.35 collected 07-2022. > Monitor clinically  Principal Problem:   Fall Active Problems:   Lymphedema   Chronic right-sided congestive heart failure ([place])   Pneumonia due to infectious organism    Prior to Admission Living Arrangement:  Anticipated Discharge Location: pending Barriers to Discharge: symptom treatment, physical evaluation Dispo: Anticipated discharge in approximately 2-3 day(s).    Roswell Nickel, MD Internal Medicine PGY-1 Pager [phone]  After 5pm on weekdays and 1pm on weekends: On Call pager [phone]

## 2023-02-06 NOTE — Plan of Care (Signed)
  Problem: Clinical Measurements: Goal: Ability to avoid or minimize complications of infection will improve Outcome: Progressing   Problem: Skin Integrity: Goal: Skin integrity will improve Outcome: Progressing   Problem: Education: Goal: Knowledge of General Education information will improve Description: Including pain rating scale, medication(s)/side effects and non-pharmacologic comfort measures Outcome: Progressing   Problem: Health Behavior/Discharge Planning: Goal: Ability to manage health-related needs will improve Outcome: Progressing   Problem: Clinical Measurements: Goal: Ability to maintain clinical measurements within normal limits will improve Outcome: Progressing Goal: Will remain free from infection Outcome: Progressing Goal: Diagnostic test results will improve Outcome: Progressing Goal: Respiratory complications will improve Outcome: Progressing Goal: Cardiovascular complication will be avoided Outcome: Progressing   Problem: Activity: Goal: Risk for activity intolerance will decrease Outcome: Progressing   Problem: Nutrition: Goal: Adequate nutrition will be maintained Outcome: Progressing   Problem: Coping: Goal: Level of anxiety will decrease Outcome: Progressing   Problem: Elimination: Goal: Will not experience complications related to bowel motility Outcome: Progressing Goal: Will not experience complications related to urinary retention Outcome: Progressing   Problem: Pain Managment: Goal: General experience of comfort will improve Outcome: Progressing   Problem: Safety: Goal: Ability to remain free from injury will improve Outcome: Progressing   Problem: Skin Integrity: Goal: Risk for impaired skin integrity will decrease Outcome: Progressing   Problem: Education: Goal: Ability to describe self-care measures that may prevent or decrease complications (Diabetes Survival Skills Education) will improve Outcome: Progressing Goal:  Individualized Educational Video(s) Outcome: Progressing   Problem: Coping: Goal: Ability to adjust to condition or change in health will improve Outcome: Progressing   Problem: Fluid Volume: Goal: Ability to maintain a balanced intake and output will improve Outcome: Progressing   Problem: Health Behavior/Discharge Planning: Goal: Ability to identify and utilize available resources and services will improve Outcome: Progressing Goal: Ability to manage health-related needs will improve Outcome: Progressing   Problem: Metabolic: Goal: Ability to maintain appropriate glucose levels will improve Outcome: Progressing   Problem: Nutritional: Goal: Maintenance of adequate nutrition will improve Outcome: Progressing Goal: Progress toward achieving an optimal weight will improve Outcome: Progressing   Problem: Skin Integrity: Goal: Risk for impaired skin integrity will decrease Outcome: Progressing   Problem: Tissue Perfusion: Goal: Adequacy of tissue perfusion will improve Outcome: Progressing

## 2023-02-06 NOTE — Significant Event (Addendum)
Rapid Response Event Note   Reason for Call :  Acute respiratory distress with oxygen desaturation to 85% on [facility]  Initial Focused Assessment:  Pt lying in bed, AO. She is noted to have accessory muscle use with abdominal breathing. Lung bases are diminished. Expiratory wheeze can be heard in bilateral upper lobes. Pt completing Albuterol Neb on my arrival. Skin is warm, dry, pink.   VS: T 97.76F, BP 134/64, HR 74, RR 20, SpO2 93% 12L/50%  Interventions:  -Albuterol 2.76m/3mL -Increased O2 to 12L/50% -Pending transfer to PCU -ABG per MD: 7.38/ 63/ 81/ 37.3  Plan of Care:  -Transfer to PCU -BiPAP if breathing remains labored  Rounded on pt at 1245. Abdominal breathing noted, however pt does not complain of difficulty breathing. BiPAP orders place. Pending transfer to PCU.   Call rapid response for additional needs  Event Summary:  MD Notified: Dr. JAllyson SabalCall Time: 1[place]Time: 1120 End Time: 1[place] RN

## 2023-02-06 NOTE — Progress Notes (Signed)
Transported on BiPAP from 2W to 3E without complications

## 2023-02-07 ENCOUNTER — Inpatient Hospital Stay (HOSPITAL_COMMUNITY): Payer: 59

## 2023-02-07 DIAGNOSIS — J9602 Acute respiratory failure with hypercapnia: Secondary | ICD-10-CM | POA: Diagnosis not present

## 2023-02-07 DIAGNOSIS — J9601 Acute respiratory failure with hypoxia: Secondary | ICD-10-CM | POA: Diagnosis not present

## 2023-02-07 DIAGNOSIS — I890 Lymphedema, not elsewhere classified: Secondary | ICD-10-CM | POA: Diagnosis not present

## 2023-02-07 DIAGNOSIS — J181 Lobar pneumonia, unspecified organism: Secondary | ICD-10-CM | POA: Diagnosis not present

## 2023-02-07 DIAGNOSIS — W19XXXA Unspecified fall, initial encounter: Secondary | ICD-10-CM | POA: Diagnosis not present

## 2023-02-07 LAB — RESPIRATORY PANEL BY PCR

## 2023-02-07 LAB — BASIC METABOLIC PANEL
Anion gap: 6 (ref 5–15)
Anion gap: 8 (ref 5–15)
BUN: 18 mg/dL (ref 8–23)
BUN: 20 mg/dL (ref 8–23)
CO2: 33 mmol/L — ABNORMAL HIGH (ref 22–32)
CO2: 35 mmol/L — ABNORMAL HIGH (ref 22–32)
Calcium: 8.3 mg/dL — ABNORMAL LOW (ref 8.9–10.3)
Calcium: 8.7 mg/dL — ABNORMAL LOW (ref 8.9–10.3)
Chloride: 98 mmol/L (ref 98–111)
Chloride: 97 mmol/L — ABNORMAL LOW (ref 98–111)
Creatinine, Ser: 0.89 mg/dL (ref 0.44–1.00)
Creatinine, Ser: 0.89 mg/dL (ref 0.44–1.00)
GFR, Estimated: >60 mL/min (ref >60)
GFR, Estimated: >60 mL/min (ref >60)
Glucose, Bld: 93 mg/dL (ref 70–99)
Glucose, Bld: 115 mg/dL — ABNORMAL HIGH (ref 70–99)
Potassium: 4.3 mmol/L (ref 3.5–5.1)
Potassium: 4.4 mmol/L (ref 3.5–5.1)
Sodium: 137 mmol/L (ref 135–145)
Sodium: 140 mmol/L (ref 135–145)

## 2023-02-07 LAB — CBC
HCT: 35.5 % — ABNORMAL LOW (ref 36.0–46.0)
Hemoglobin: 11.2 g/dL — ABNORMAL LOW (ref 12.0–15.0)
MCH: 29.2 pg (ref 26.0–34.0)
MCHC: 31.5 g/dL (ref 30.0–36.0)
MCV: 92.4 fL (ref 80.0–100.0)
Platelets: 252 10*3/uL (ref 150–400)
RBC: 3.84 MIL/uL — ABNORMAL LOW (ref 3.87–5.11)
RDW: 14.7 % (ref 11.5–15.5)
WBC: 8.8 10*3/uL (ref 4.0–10.5)
nRBC: 0 % (ref 0.0–0.2)

## 2023-02-07 LAB — RESP PANEL BY RT-PCR (RSV, FLU A&B, COVID)  RVPGX2
Influenza A by PCR: NEGATIVE
Influenza B by PCR: NEGATIVE
Resp Syncytial Virus by PCR: NEGATIVE
SARS Coronavirus 2 by RT PCR: NEGATIVE

## 2023-02-07 LAB — GLUCOSE, CAPILLARY
Glucose-Capillary: 93 mg/dL (ref 70–99)
Glucose-Capillary: 127 mg/dL — ABNORMAL HIGH (ref 70–99)
Glucose-Capillary: 110 mg/dL — ABNORMAL HIGH (ref 70–99)
Glucose-Capillary: 108 mg/dL — ABNORMAL HIGH (ref 70–99)
Glucose-Capillary: 136 mg/dL — ABNORMAL HIGH (ref 70–99)

## 2023-02-07 LAB — PROCALCITONIN: Procalcitonin: <0.1 ng/mL

## 2023-02-07 LAB — BRAIN NATRIURETIC PEPTIDE: B Natriuretic Peptide: 216.5 pg/mL — ABNORMAL HIGH (ref 0.0–100.0)

## 2023-02-07 MED ORDER — ARFORMOTEROL TARTRATE 15 MCG/2ML IN NEBU
15.0000 ug | INHALATION_SOLUTION | Freq: Two times a day (BID) | RESPIRATORY_TRACT | Status: DC
  Administered 2023-02-07 – 2023-02-12 (×10): 15 ug via RESPIRATORY_TRACT
  Filled 2023-02-07 (×11): qty 2

## 2023-02-07 MED ORDER — REVEFENACIN 175 MCG/3ML IN SOLN
175.0000 ug | Freq: Every day | RESPIRATORY_TRACT | Status: DC
  Administered 2023-02-08 – 2023-02-12 (×5): 175 ug via RESPIRATORY_TRACT
  Filled 2023-02-07 (×7): qty 3

## 2023-02-07 MED ORDER — FUROSEMIDE 10 MG/ML IJ SOLN: 40.0000 mg | Freq: Two times a day (BID) | INTRAMUSCULAR | Status: DC

## 2023-02-07 MED ORDER — CHLORHEXIDINE GLUCONATE CLOTH 2 % EX PADS: 6.0000 | MEDICATED_PAD | Freq: Every day | CUTANEOUS | Status: DC

## 2023-02-07 MED ORDER — METHYLPREDNISOLONE SODIUM SUCC 40 MG IJ SOLR
40.0000 mg | Freq: Every day | INTRAMUSCULAR | Status: DC
  Administered 2023-02-07 – 2023-02-08 (×2): 40 mg via INTRAVENOUS
  Filled 2023-02-07 (×2): qty 1

## 2023-02-07 MED ORDER — ENOXAPARIN SODIUM 80 MG/0.8ML IJ SOSY
70.0000 mg | PREFILLED_SYRINGE | INTRAMUSCULAR | Status: DC
  Administered 2023-02-08 – 2023-02-11 (×4): 70 mg via SUBCUTANEOUS
  Filled 2023-02-07: qty 0.8
  Filled 2023-02-07: qty 0.7
  Filled 2023-02-07 (×2): qty 0.8

## 2023-02-07 MED ORDER — FUROSEMIDE 10 MG/ML IJ SOLN
80.0000 mg | Freq: Three times a day (TID) | INTRAMUSCULAR | Status: AC
  Administered 2023-02-07 – 2023-02-08 (×3): 80 mg via INTRAVENOUS
  Filled 2023-02-07 (×3): qty 8

## 2023-02-07 MED ORDER — FUROSEMIDE 10 MG/ML IJ SOLN
40.0000 mg | Freq: Once | INTRAMUSCULAR | Status: AC
  Administered 2023-02-07: 40 mg via INTRAVENOUS
  Filled 2023-02-07: qty 4

## 2023-02-07 NOTE — Progress Notes (Signed)
Bipap removed to give scheduled meds, 6L [place] applied. Patient requested to keep Bipap off for a little bit, was able to keep off for about 15 minutes, ate half a banana along with 2 cups of water with meds. Sats as low as 86 and work of breathing increasing over time. Patient attempted incentive spirometry up to 500. Critical care Dr at beside, Bipap was put back on.

## 2023-02-07 NOTE — Consult Note (Signed)
NAME:  Maria [name], MRN:  [id number], DOB:  26-Apr-1956, LOS: 4 ADMISSION DATE:  02/02/2023, CONSULTATION DATE:  2/18 REFERRING MD:  Koren Bound- IMTS, CHIEF COMPLAINT:  respiratory failure   History of Present Illness:  Maria [name] is a 67 y/o woman with a history of HFpEF, former tobacco abuse who presented to the hospital on 2/13 after falling at home. She was found to have RLE cellulitis, which she has been treated for during this admission. She has no baseline lung disease and quit smoking in 2009. She is not on home oxygen or inhalers. Prior to admission she had noticed increased leg edema, but not abdominal distention. Her leg edema has improved since admission. Today she has a new cough and although she feels like she needs to cough somehting out, she has been unable to produce sputum. She denies fevers. She has been treated for possible CAP this hospital admission with ceftriaxone and azithromycin and has been diuresed with lasix. She has incomplete I/O data, but has diuresed off about 10# this admission. She reports not being out of bed since admission on 2/13. She has been on BiPAP since yesterday. Viral PCRs all negative for URI.  PCCM was consulted for evaluation of respiratory failure this evening,   Pertinent  Medical History  HFpEF Tobacco abuse- quit 2009 after ~ 30 years GERD HTN HLD DM Mild OSA (HSAT 2019- AHI 6.9)  Significant Hospital Events: Including procedures, antibiotic start and stop dates in addition to other pertinent events   2/13 admission after fall, treated for cellulitis 2/16 started back on ceftriaxone, azithromycin for CAP 2/17 started on bipap  Interim History / Subjective:    Objective   Blood pressure (!) 114/43, pulse 71, temperature 98.2 F (36.8 C), temperature source Axillary, resp. rate (!) 22, height 5' 3"$  (1.6 m), weight (!) 145.5 kg, SpO2 100 %.    FiO2 (%):  [40 %-50 %] 50 %   Intake/Output Summary (Last 24 hours) at 02/07/2023 1819 Last  data filed at 02/07/2023 1800 Gross per 24 hour  Intake 0 ml  Output 2500 ml  Net -2500 ml   Filed Weights   02/05/23 1050 02/06/23 1414 02/07/23 0355  Weight: (!) 159.2 kg (!) 146.6 kg (!) 145.5 kg    Examination: General: elderly woman sitting up in bed in NAD HENT: [place]/AT, eyes anicteric Lungs: Tachypnea, mild expiratory abdominal accessory muscle use. No wheezing or rhonchi. Occasional coughing. Conversational dyspnea limiting her to speaking 1/2 sentences. Saturating in upper 80s on 6L [place]. Cardiovascular: S1S2, RRR Abdomen: obese, soft, NT Extremities: RLE wrapped, +edema Neuro: awake, alert, moving all extremities Derm: warm, dry  WBC 8.8 H/H 11.2/35.5  Covid, flu, RVP all negative Bicarb 25 BUN 20 Cr 0.89  Ct chest personally reviewed> large bilateral layering pleural effusions with overlying compressive atelectasis, patchy airspace disease. Scattered areas of interlobular septal thickening.   Resolved Hospital Problem list     Assessment & Plan:  Acute on chronic respiratory failure with hypoxia and hypercapnia. Likely has OHS at baseline.  Favor acute cardiogenic pulmonary edema over multifocal pneumonia in the absence of leukocytosis or fever, although we cannot rule this out. Given the timeline during her admission, she is at risk for more resistant organisms, especially with her history of ESBL. Will broaden if PCT is increased.  PE less likely given use of DVT prophylaxis, but cannot be evaluated with her CT. -transfer to ICU for closer monitoring; con't BiPAP PRN -check BNP & PCT now -aggressive  diuresis- lasix 45m Q8h overnight; true volume status this admission is impossible to know without standing weights or accurate I/O -BiPAP PRN -increase bronchodilators-- brovana, yupelri -steroids -Discussed code status. She would want resuscitation in the vent of cardiopulmonary arrest. Her daughter YDenman Georgeis her sAir traffic controller [place]    Cellulitis -con't Blactam antibiotic  DM -SSI PRN -goal BG 140-180  Obesity -long term recommend weight loss  OSA, mild -BIPAP    Best Practice (right click and "Reselect all SmartList Selections" daily)   Diet/type: clear liquids DVT prophylaxis: LMWH GI prophylaxis: PPI Lines: N/A Foley:  Yes, and it is still needed Code Status:  full code Last date of multidisciplinary goals of care discussion [ ]$   Labs   CBC: Recent Labs  Lab 02/02/23 0735 02/03/23 0210 02/05/23 1011 02/06/23 0410 02/06/23 1753 02/07/23 0946  WBC 10.4 6.7 9.4 9.4  --  8.8  NEUTROABS 8.3*  --  7.4  --   --   --   HGB 11.2* 10.2* 10.5* 10.0* 11.2* 11.2*  HCT 33.7* 30.6* 33.4* 31.9* 33.0* 35.5*  MCV 88.2 88.2 93.0 93.5  --  92.4  PLT 233 204 216 212  --  [id number]   Basic Metabolic Panel: Recent Labs  Lab 02/03/23 0210 02/04/23 0040 02/05/23 1011 02/06/23 0410 02/06/23 1753 02/07/23 0005 02/07/23 1606  NA 137 138 141 140 138 137 140  K 3.1* 3.5 3.8 4.8 4.6 4.3 4.4  CL 96* 99 99 99  --  98 97*  CO2 31 30 32 34*  --  33* 35*  GLUCOSE 136* 162* 146* 132*  --  93 115*  BUN 19 19 13 14  $ --  18 20  CREATININE 1.27* 1.39* 1.06* 1.10*  --  0.89 0.89  CALCIUM 8.4* 8.0* 8.0* 8.3*  --  8.3* 8.7*  MG 2.1  --   --   --   --   --   --    GFR: Estimated Creatinine Clearance: 89.1 mL/min (by C-G formula based on SCr of 0.89 mg/dL). Recent Labs  Lab 02/03/23 0210 02/05/23 1011 02/06/23 0410 02/07/23 0946  WBC 6.7 9.4 9.4 8.8    Liver Function Tests: Recent Labs  Lab 02/02/23 0735  AST 20  ALT 17  ALKPHOS 66  BILITOT 1.5*  PROT 6.8  ALBUMIN 3.5   No results for input(s): "LIPASE", "AMYLASE" in the last 168 hours. No results for input(s): "AMMONIA" in the last 168 hours.  ABG    Component Value Date/Time   PHART 7.45 02/06/2023 1800   PCO2ART 58 (H) 02/06/2023 1800   PO2ART 72 (L) 02/06/2023 1800   HCO3 40.3 (H) 02/06/2023 1800   TCO2 35 (H) 02/06/2023 1753   O2SAT 98.1  02/06/2023 1800     Coagulation Profile: No results for input(s): "INR", "PROTIME" in the last 168 hours.  Cardiac Enzymes: No results for input(s): "CKTOTAL", "CKMB", "CKMBINDEX", "TROPONINI" in the last 168 hours.  HbA1C: Hgb A1c MFr Bld  Date/Time Value Ref Range Status  02/02/2023 01:25 PM 6.5 (H) 4.8 - 5.6 % Final    Comment:    (NOTE) Pre diabetes:          5.7%-6.4%  Diabetes:              >6.4%  Glycemic control for   <7.0% adults with diabetes   01/30/2022 01:42 PM 6.9 (H) 4.6 - 6.5 % Final    Comment:    Glycemic Control Guidelines for  People with Diabetes:Non Diabetic:  <6%Goal of Therapy: <7%Additional Action Suggested:  >8%     CBG: Recent Labs  Lab 02/06/23 1553 02/06/23 2103 02/07/23 0619 02/07/23 1359 02/07/23 1625  GLUCAP 129* 106* 93 127* 110*    Review of Systems:   Review of Systems  Constitutional:  Negative for fever.  HENT: Negative.    Respiratory:  Positive for cough and shortness of breath.   Cardiovascular:  Positive for leg swelling. Negative for chest pain.  Gastrointestinal: Negative.   Musculoskeletal: Negative.   Skin:  Negative for rash.  Neurological:  Negative for focal weakness.     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   Surgical History:   Past Surgical History:  Procedure Laterality Date   ABDOMINAL  HYSTERECTOMY  1999   1 ovary left   bloo clot removed from neck   may 25th , june 2. 2010   Normal SURGERY  may 24th 2010   COLONOSCOPY WITH PROPOFOL N/A 07/10/2016   Procedure: COLONOSCOPY WITH PROPOFOL;  Surgeon: Manus Gunning, MD;  Location: [facility];  Service: Gastroenterology;  Laterality: N/A;   COLONOSCOPY WITH PROPOFOL N/A 09/26/2019   Procedure: COLONOSCOPY WITH PROPOFOL;  Surgeon: Yetta Flock, MD;  Location: [facility];  Service: Gastroenterology;  Laterality: N/A;   ESOPHAGOGASTRODUODENOSCOPY (EGD) WITH PROPOFOL N/A 07/10/2016   Procedure: ESOPHAGOGASTRODUODENOSCOPY (EGD) WITH PROPOFOL;  Surgeon: Manus Gunning, MD;  Location: [facility];  Service: Gastroenterology;  Laterality: N/A;   POLYPECTOMY  09/26/2019   Procedure: POLYPECTOMY;  Surgeon: Yetta Flock, MD;  Location: [facility];  Service: Gastroenterology;;   s/p ovary cyst     s/p right knee arthroscopy     Dr. Mardelle Matte ortho     Social History:   reports that she quit smoking about 14 years ago. Her smoking use included cigarettes. She has never used smokeless tobacco. She reports that she does not drink alcohol and does not use drugs.   Family History:  Her family history includes Anxiety disorder in her sister; Asthma in her daughter and sister; Bipolar disorder in her daughter; Cancer in her maternal uncle and another family member; Depression in her sister; Diabetes in her mother; Heart disease in her mother; Heart failure in her mother; Hypertension in her father and mother; Liver cancer in her paternal grandmother.   Allergies Allergies  Allergen Reactions   Amoxicillin-Pot Clavulanate Nausea And Vomiting    Projectile vomiting Did it involve swelling of the face/tongue/throat, SOB, or low BP? No Did it involve sudden or severe rash/hives, skin peeling, or any reaction on the inside of your mouth or nose? No Did you need to seek medical attention at a hospital or  doctor's office? No When did it last happen?      20-30 years If all above answers are "NO", may proceed with cephalosporin use.    Adhesive [Tape] Other (See Comments)    Tears skin off - use paper tape    Codeine Other (See Comments)    hallucinations   Crestor [Rosuvastatin Calcium]     Severe muscle cramps   Lyrica [Pregabalin]  Other (See Comments)    weakness   Morphine And Related Nausea And Vomiting and Other (See Comments)    Migraine headaches   Vicodin [Hydrocodone-Acetaminophen] Other (See Comments)    hallucinations   Latex Rash     Home Medications  Prior to Admission medications   Medication Sig Start Date End Date Taking? Authorizing Provider  albuterol (PROAIR HFA) 108 (90 Base) MCG/ACT inhaler Inhale 2 puffs into the lungs every 6 (six) hours as needed for wheezing or shortness of breath. 11/05/20  Yes Colin Benton R, DO  cefdinir (OMNICEF) 300 MG capsule Take 1 capsule (300 mg total) by mouth 2 (two) times daily. 01/23/23  Yes Azucena Cecil, PA-C  cholecalciferol (VITAMIN D3) 25 MCG (1000 UNIT) tablet Take 2,000 Units by mouth daily.   Yes [provider]  citalopram (CELEXA) 20 MG tablet Take 1 tablet (20 mg total) by mouth daily. 11/02/22  Yes Biagio Borg, MD  clonazePAM (KLONOPIN) 1 MG tablet Take 1 tablet (1 mg total) by mouth 2 (two) times daily as needed for anxiety. for anxiety 12/09/22  Yes Biagio Borg, MD  cyclobenzaprine (FLEXERIL) 10 MG tablet TAKE 1 TABLET BY MOUTH 2 TIMES A DAY AS NEEDED FOR MUSCLE SPASMS Patient taking differently: Take 10 mg by mouth 2 (two) times daily as needed for muscle spasms. 02/27/21  Yes Biagio Borg, MD  FARXIGA 5 MG TABS tablet Take 5 mg by mouth daily. 07/06/22  Yes [provider]  fluticasone (FLONASE) 50 MCG/ACT nasal spray Place 1 spray into both nostrils daily. 12/07/22  Yes [provider]  ibuprofen (ADVIL) 800 MG tablet Take 800 mg by mouth every 8 (eight) hours as needed for mild  pain. 01/15/23  Yes [provider]  Metamucil Fiber CHEW Chew 3 tablets by mouth at bedtime.   Yes [provider]  metolazone (ZAROXOLYN) 5 MG tablet Take 5 mg by mouth 2 (two) times a week. Per nephrologist on Wed and Sunday   Yes Claudia Desanctis, MD  metoprolol tartrate (LOPRESSOR) 25 MG tablet Take 25 mg by mouth in the morning.   Yes [provider]  ondansetron (ZOFRAN ODT) 4 MG disintegrating tablet Take 1 tablet (4 mg total) by mouth every 4 (four) hours as needed for nausea or vomiting. 08/23/19  Yes Charlesetta Shanks, MD  Oxycodone HCl 10 MG TABS Take 1 tablet (10 mg total) 4 (four) times daily by mouth. Per [facility] Patient taking differently: Take 10 mg by mouth every 4 (four) hours as needed (pain). Per [facility] 11/08/17  Yes Biagio Borg, MD  pantoprazole (PROTONIX) 40 MG tablet TAKE ONE TABLET BY MOUTH DAILY Patient taking differently: Take 40 mg by mouth daily. 01/29/22  Yes Biagio Borg, MD  Polyethyl Glycol-Propyl Glycol 0.4-0.3 % SOLN Place 1 drop into both eyes daily as needed (dry eyes).   Yes [provider]  potassium chloride SA (KLOR-CON M) 20 MEQ tablet Take 4 tablets (80 mEq total) by mouth 2 (two) times daily. Patient taking differently: Take 40 mEq by mouth 2 (two) times daily. 09/01/22  Yes Minus Breeding, MD  pramipexole (MIRAPEX) 0.5 MG tablet Take 1 tablet (0.5 mg total) by mouth 3 (three) times daily. 06/24/22  Yes Biagio Borg, MD  spironolactone (ALDACTONE) 25 MG tablet Take 0.5 tablets (12.5 mg total) by mouth daily. Patient taking differently: Take 25 mg by mouth daily. 08/14/22  Yes Finis Bud, NP  torsemide [facility]) 20  MG tablet Take 3 tablets (60 mg total) by mouth 2 (two) times daily. 09/01/22  Yes Minus Breeding, MD  Blood Glucose Monitoring Suppl (ACCU-CHEK GUIDE) w/Device KIT Use as directed Patient taking differently: 1 each by Other route as directed. 04/11/21   Elodia Florence., MD   glucose blood (ACCU-CHEK GUIDE) test strip Use as instructed up to 4 times daily 04/11/21   Elodia Florence., MD  [facility] 4 MG/0.1ML LIQD nasal spray kit Place 0.4 mg into the nose daily as needed (opioid overdose). 04/25/19   [provider]  DULoxetine (CYMBALTA) 60 MG capsule Take 60 mg by mouth daily.    03/09/12  [provider]     Critical care time: 38 min.     Julian Hy, DO 02/07/23 6:20 PM [facility]  For contact information, see Amion. If no response to pager, please call PCCM consult pager. After hours, 7PM- 7AM, please call Elink.

## 2023-02-07 NOTE — Progress Notes (Signed)
Subjective:  Kept on BiPAP overnight due to hypoxia and increased WOB overnight when trying to transition to [place].   She reports feeling frustrated due to having trouble breathing. She knows that the BiPAP is helping. She is very appreciative of the care she is receiving from everyone. Just wanting to feel better and breathe better, which we discussed working together to achieve. States she is not feeling hungry at this time.    Objective: Vitals over previous 24hr: Vitals:   02/06/23 1120 02/06/23 1126 02/06/23 1127 02/06/23 1137  BP:    134/65  Pulse:    74  Resp:    20  Temp:    (!) 97.3 F (36.3 C)  TempSrc:    Axillary  SpO2: (!) 74% 94% 90% 93%  Weight:      Height:       General: anxious elderly female, laying in bed on BiPAP, NAD. CV: normal rate and regular rhythm, no m/r/g. Pulm: unlabored breathing on BiPAP. Difficult to auscultate lung sounds given body habitus, but mild wheezing noted bilaterally. No obvious rales and rhonchi noted.  Abdomen: obese, soft, nontender, nondistended, normoactive BS. MSK: chronic lymphedema, legs in UNNA boots, trace peripheral edema but relatively unchanged from yesterday. Does have some mild dependent sacral edema.  Neuro: AAOx3, no focal deficits. Psych: anxious mood with congruent affect.   Assessment/Plan: Ms Teall is a 67 year old female with a past medical history of morbid obesity, hypertension, hyperlipidemia, chronic diastolic heart failure, chronic lymphadenitis, CKD IIIb, and DDD who was delivered to the ED after a mechanical fall at home, now admitted for management of lower extremity cellulitis and chronic lymphadenitis.   ---Acute hypoxic and hypercapnic respiratory failure ---Multifocal pneumonia ---Acute on chronic diastolic heart failure History of diastolic heart failure, most recent echocardiogram performed 09-2021 demonstrated ejection fraction 65-70% and grade II diastolic dysfunction. Received diuresis and is  almost -6L since admission. Concern for multifocal PNA, CAP coverage was initiated with rocephin and azithromycin a couple of days ago. ABGs yesterday revealing hypercapnia and hypoxia, likely is a chronic CO2 retainer. Chest x-ray obtained today, reflective of possible worsening PNA versus worsening edema. Her acute respiratory failure is likely multifactorial in the setting of diastolic HF exacerbation, PNA, and likely undiagnosed OSA/OHS. She is tolerating BiPAP but there is concern given inability to transition back to [place]. Will need to be watch for potential ARDS development. Will obtain CT chest for further evaluation. Given mild dependent edema, will provide IV lasix 64m and assess for improvement in oxygenation. Remains afebrile and without leukocytosis, will continue CAP coverage. If becomes febrile or clinically deteriorating, low threshold to expand antibiotic coverage. Given lack of improvement on imaging, will also check RVP and COVID/flu/RSV to rule out viral etiologies. -last dose of azithromycin today -rocephin (day 3/5) -duonebs q6h -albuterol q6 prn -IV lasix 452m check BMP this afternoon > if kidney function stable, will add another dose of lasix this evening -BiPAP, transition back to [place] as able -Chest PT and flutter valve -adding incentive spirometry -f/u respiratory viral panel, COVID/flu/RSV -f/u CT chest wo contrast  ---Lower extremity cellulitis - resolved ---Chronic lymphadenitis Patient has a history of lymphadenitis and morbid obesity. During her most recent hospitalization, her legs were placed in an Unna boot and she was discharged with home health. She presented on 2-13 with lower extremity swelling and erythema, which has worsened over the last few weeks. Lymphedema likely has component of venous insufficiency secondary to morbid obesity and physical inactivity, heart  failure probably contributing as well. Cefazolin started and then switched to ceftriaxone plus  azithromycin on 2-16 for additional pneumonia coverage. Area of involvement and swelling have both improved considerably since starting treatment. > antibiotics as noted above > Unna boot > Consider lymphedema clinic referral upon discharge  ---Mechanical fall ---Physical deconditioning Patient was delivered to [facility] ED after experiencing a mechanical fall without loss of consciousness or prodromal symptoms. Upon arrival, imaging was negative for acute intracranial process and thoracic-lumbar fracture, but identified mild-moderate degenerative spondylosis. Vitamin D level within normal limits. Etiology of falls is likely multifactorial including pain, increased lower extremity swelling, and physical deconditioning. Occupational and physical therapy teams are recommending discharge to skilled nursing facility for rehabilitation before returning home. > Transitions of care consult for SNF placement > Acetaminophen 1026m q8 > Oxycodone 537mq6 PRN, moderate-severe pain > Hydromorphone 0.39m68m6 PRN, breakthrough pain > Fall precautions > Physical-occupational therapy > [facility]  ---[facility]pertension [facility] of [facility]. Since arrival, her blood pressure has been stable on home metoprolol dose. > Metoprolol 239m68m4  ---Chronic kidney disease IIIb Patient has history of chronic kidney disease with baseline creatinine roughly around 1.1-1.3 per electronic medical record. Upon arrival, her creatinine was 1.31 and potassium 3.8.  Creatinine has remained stable throughout current hospitalization. > Trend BMP q24 > Avoid nephrotoxic agents  ---Anxiety ---Depression Patient has history of anxiety and depression managed at home with citalopram. She also takes clonazepam twice daily as needed. Anxiety is likely a contributing factor to her respiratory distress, counseling was provided on deep-breathing  relaxation techniques. > Citalopram 20mg639m > Clonazepam 1mg q23mPRN  ---Diabetes II ---Obesity III Patient has history of morbid obesity and diabetes, her current BMI is 64. Previous A1C collected 01-2022 was 6.9. Managed at home with dapagliflozin. Upon arrival, her A1C was 6.5. Blood glucose has remained in the 100-200 range throughout current hospitalization. > Sliding scale insulin, sensitive sensitivity  > Dapagliflozin 39mg q251m---Intertrigo Patient has history of morbid obesity and abdominal pannus. Upon arrival, erythematous patch was present in her inguinal region.  > Clotrimazole cream 1% q12  ---Restless legs syndrome Patient has a history of restless legs syndrome managed at home with pramipexole. > Pramipexole 0.39mg q8 13m-Thyroid multinodular mass Patient has history of thyroid nodule. Fine-needle aspiration of left lower thyroid lobe demonstrated benign hyperplastic nodule classified as Bet[place]da category 2. Most recent TSH level was 2.35 collected 07-2022. > Monitor clinically   Principal Problem:   Fall Active Problems:   Lymphedema   Chronic right-sided congestive heart failure (HCC)   PJacksonmonia due to infectious organism    Prior to Admission Living Arrangement:  Anticipated Discharge Location: pending Barriers to Discharge: symptom treatment, physical evaluation Dispo: Anticipated discharge in approximately 2-3 day(s).    Juliza Machnik [facility] PGY-3 Pager [phone]  [phone] on weekdays and 1pm on weekends: On Call pager [phone]

## 2023-02-07 NOTE — Progress Notes (Signed)
Patient transported on Bipap from 3E30 to CT2 and back without any complications.

## 2023-02-07 NOTE — Progress Notes (Signed)
I took patient off of bipap and placed on 5L [place]. Her work of breathing increased significantly and her Sp02 dropped to the lower 80s. Placed pt back on bipap. Sp02 increased to 95% and work of breathing normalized. CPT held this round.

## 2023-02-08 DIAGNOSIS — J9621 Acute and chronic respiratory failure with hypoxia: Secondary | ICD-10-CM | POA: Diagnosis not present

## 2023-02-08 DIAGNOSIS — J9622 Acute and chronic respiratory failure with hypercapnia: Secondary | ICD-10-CM

## 2023-02-08 DIAGNOSIS — I5033 Acute on chronic diastolic (congestive) heart failure: Secondary | ICD-10-CM | POA: Diagnosis not present

## 2023-02-08 LAB — CBC WITH DIFFERENTIAL/PLATELET
Abs Immature Granulocytes: 0.03 10*3/uL (ref 0.00–0.07)
Basophils Absolute: 0 10*3/uL (ref 0.0–0.1)
Basophils Relative: 0 %
Eosinophils Absolute: 0 10*3/uL (ref 0.0–0.5)
Eosinophils Relative: 0 %
HCT: 35.6 % — ABNORMAL LOW (ref 36.0–46.0)
Hemoglobin: 11.2 g/dL — ABNORMAL LOW (ref 12.0–15.0)
Immature Granulocytes: 0 %
Lymphocytes Relative: 8 %
Lymphs Abs: 0.6 10*3/uL — ABNORMAL LOW (ref 0.7–4.0)
MCH: 28.8 pg (ref 26.0–34.0)
MCHC: 31.5 g/dL (ref 30.0–36.0)
MCV: 91.5 fL (ref 80.0–100.0)
Monocytes Absolute: 0.2 10*3/uL (ref 0.1–1.0)
Monocytes Relative: 3 %
Neutro Abs: 6.2 10*3/uL (ref 1.7–7.7)
Neutrophils Relative %: 89 %
Platelets: 252 10*3/uL (ref 150–400)
RBC: 3.89 MIL/uL (ref 3.87–5.11)
RDW: 14.6 % (ref 11.5–15.5)
WBC: 7 10*3/uL (ref 4.0–10.5)
nRBC: 0 % (ref 0.0–0.2)

## 2023-02-08 LAB — BASIC METABOLIC PANEL
Anion gap: 12 (ref 5–15)
BUN: 25 mg/dL — ABNORMAL HIGH (ref 8–23)
CO2: 28 mmol/L (ref 22–32)
Calcium: 8.5 mg/dL — ABNORMAL LOW (ref 8.9–10.3)
Chloride: 100 mmol/L (ref 98–111)
Creatinine, Ser: 0.93 mg/dL (ref 0.44–1.00)
GFR, Estimated: >60 mL/min (ref >60)
Glucose, Bld: 152 mg/dL — ABNORMAL HIGH (ref 70–99)
Potassium: 4.2 mmol/L (ref 3.5–5.1)
Sodium: 140 mmol/L (ref 135–145)

## 2023-02-08 LAB — GLUCOSE, CAPILLARY
Glucose-Capillary: 149 mg/dL — ABNORMAL HIGH (ref 70–99)
Glucose-Capillary: 125 mg/dL — ABNORMAL HIGH (ref 70–99)
Glucose-Capillary: 141 mg/dL — ABNORMAL HIGH (ref 70–99)
Glucose-Capillary: 130 mg/dL — ABNORMAL HIGH (ref 70–99)
Glucose-Capillary: 134 mg/dL — ABNORMAL HIGH (ref 70–99)

## 2023-02-08 LAB — MAGNESIUM: Magnesium: 2.4 mg/dL (ref 1.7–2.4)

## 2023-02-08 LAB — PHOSPHORUS: Phosphorus: 4.2 mg/dL (ref 2.5–4.6)

## 2023-02-08 LAB — PROCALCITONIN: Procalcitonin: <0.1 ng/mL

## 2023-02-08 MED ORDER — CHLORHEXIDINE GLUCONATE CLOTH 2 % EX PADS
6.0000 | MEDICATED_PAD | Freq: Every day | CUTANEOUS | Status: DC
  Administered 2023-02-08: 6 via TOPICAL

## 2023-02-08 MED ORDER — ORAL CARE MOUTH RINSE: 15.0000 mL | OROMUCOSAL | Status: DC | PRN

## 2023-02-08 NOTE — Progress Notes (Signed)
Transitioned patient from BIPAP to [place] at 30L/50%, pt tolerating well VS WNL. RT will titrate as tolerated.

## 2023-02-08 NOTE — Progress Notes (Signed)
Physical Therapy Treatment Patient Details Name: Lucresia Avalo Horan MRN: [id number] DOB: Jul 03, 1956 Today's Date: 02/08/2023   History of Present Illness Pt is a 67 y/o female presenting after a fall at home. Negative for acute fx. Admitted for pain control s/p fall, LE edema and LE cellulitis. RR for respiratory distress on 2/17, transitioned to ICU and started treatment for PNA, placed on bipap, as of 2/19 on HHFNC. PMH: chronic lymphadenitis, HTN, CKD, CHF, GERD, DDD, anxiety, DM, HLD, depression    PT Comments    Pt reports eagerness to get OOB, although does report feeling very weak. Pt tolerating transfer-level mobility well at this time, requiring min-mod +2 assist to get to recliner this date. Pt performed x2 sit<>stand transfers with rest breaks in between, SPO2 85-91% on 30L/50% HHFNC. Pt tolerated session well overall, plan remains appropriate.    Recommendations for follow up therapy are one component of a multi-disciplinary discharge planning process, led by the attending physician.  Recommendations may be updated based on patient status, additional functional criteria and insurance authorization.  Follow Up Recommendations  Skilled nursing-short term rehab (<3 hours/day) Can patient physically be transported by private vehicle: No   Assistance Recommended at Discharge Frequent or constant Supervision/Assistance  Patient can return home with the following Two people to help with walking and/or transfers;A lot of help with bathing/dressing/bathroom;Assistance with cooking/housework;Assist for transportation;Help with stairs or ramp for entrance   Equipment Recommendations  Rolling walker (2 wheels)    Recommendations for Other Services       Precautions / Restrictions Precautions Precautions: Fall Precaution Comments: has B knee and foot pain, HHFNC 30L/50% Restrictions Weight Bearing Restrictions: No     Mobility  Bed Mobility Overal bed mobility: Needs Assistance Bed  Mobility: Supine to Sit     Supine to sit: Mod assist, +2 for safety/equipment     General bed mobility comments: assist for trunk elevation, scooting to EOB with increased time and effort    Transfers Overall transfer level: Needs assistance Equipment used: Rolling walker (2 wheels) Transfers: Sit to/from Stand, Bed to chair/wheelchair/BSC Sit to Stand: Mod assist, +2 physical assistance, Min assist   Step pivot transfers: +2 physical assistance, Mod assist       General transfer comment: Initially mod, transitioning to min, +2 assist for rise and steady from EOB and recliner. mod +2 for pivotal steps to reach recliner towards pt's R, PT and PT aide steadying pt and managing lines/leads/RW.    Ambulation/Gait               General Gait Details: nt   Marine scientist [place] (Stroke Patients Only)       Balance Overall balance assessment: Needs assistance Sitting-balance support: Feet supported Sitting balance-Leahy Scale: Fair     Standing balance support: Bilateral upper extremity supported Standing balance-Leahy Scale: Poor                              Cognition Arousal/Alertness: Awake/alert Behavior During Therapy: WFL for tasks assessed/performed Overall Cognitive Status: Within Functional Limits for tasks assessed                                          Exercises      General  Comments General comments (skin integrity, edema, etc.): SpO2 85-91% on 30L/50% HHFNC, cues for breathing technique and rest as needed throughout      Pertinent Vitals/Pain Pain Assessment Pain Assessment: Faces Faces Pain Scale: Hurts little more Pain Location: bilat LEs Pain Descriptors / Indicators: Grimacing, Guarding, Sore Pain Intervention(s): Limited activity within patient's tolerance, Monitored during session, Repositioned    Home Living                          Prior  Function            PT Goals (current goals can now be found in the care plan section) Acute Rehab PT Goals Patient Stated Goal: to get pain reduced and walk better, better balance PT Goal Formulation: With patient Time For Goal Achievement: 02/17/23 Potential to Achieve Goals: Good Progress towards PT goals: Progressing toward goals    Frequency    Min 3X/week      PT Plan Current plan remains appropriate    Co-evaluation              AM-PAC PT "6 Clicks" Mobility   Outcome Measure  Help needed turning from your back to your side while in a flat bed without using bedrails?: A Lot Help needed moving from lying on your back to sitting on the side of a flat bed without using bedrails?: A Lot Help needed moving to and from a bed to a chair (including a wheelchair)?: A Lot Help needed standing up from a chair using your arms (e.g., wheelchair or bedside chair)?: A Lot Help needed to walk in hospital room?: Total Help needed climbing 3-5 steps with a railing? : Total 6 Click Score: 10    End of Session Equipment Utilized During Treatment: Oxygen Activity Tolerance: Patient limited by fatigue Patient left: in chair;with call bell/phone within reach;with chair alarm set Nurse Communication: Mobility status (min +2 stand pivot back to bed) PT Visit Diagnosis: Unsteadiness on feet (R26.81);Muscle weakness (generalized) (M62.81);Repeated falls (R29.6);History of falling (Z91.81);Difficulty in walking, not elsewhere classified (R26.2);Pain     Time: [id number] PT Time Calculation (min) (ACUTE ONLY): 22 min  Charges:  $Therapeutic Activity: 8-22 mins        Stacie Glaze, PT DPT Acute Rehabilitation Services Pager [phone]  Office [phone]    Louis Matte 02/08/2023, 4:37 PM

## 2023-02-08 NOTE — Progress Notes (Addendum)
[facility] Progress Note Patient Name: Maria [name] DOB: 1956/11/04 MRN: [id number]   Date of Service  02/08/2023  HPI/Events of Note  60 F hx of HFpEF, previous smoker, initially presented 2/13 due to fall noted to have RLE cellulitis and treated for CAP as well as diuresed. Placed on BiPap yesterday and transferred to ICU 2/19. BNP 216, procalcitonin >0.10  Seen tolerating BiPap  BSRN inquiring if foley cath ordered is  necessary as patient able to make urine on her own  eICU Interventions  Ongoing diuresis and able to void freely. OK to hold off on foley catheter insertion for now as long as able to measure urine for accurate input and output monitoring Continued on BiPap     Intervention Category Evaluation Type: New Patient Evaluation  Judd Lien 02/08/2023, 12:12 AM

## 2023-02-08 NOTE — Progress Notes (Addendum)
RT NOTE:  Pt transferred to 3E20 on [place]. RT assisted with transfer. Pt tolerated well. Report given to [facility], RRT.

## 2023-02-08 NOTE — Progress Notes (Signed)
NAME:  Maria [name], MRN:  [id number], DOB:  [birth year], LOS: 5 ADMISSION DATE:  02/02/2023, CONSULTATION DATE:  02/07/2023 REFERRING MD:  Thayer Headings, CHIEF COMPLAINT:  respiratory failure   History of Present Illness:  Maria [name] is a 67 y/o woman with a history of HFpEF, former tobacco abuse who presented to the hospital on 2/13 after falling at home. She was found to have RLE cellulitis, which she has been treated for during this admission. She has no baseline lung disease and quit smoking in 2009. She is not on home oxygen or inhalers. Prior to admission she had noticed increased leg edema, but not abdominal distention. Her leg edema has improved since admission. Today she has a new cough and although she feels like she needs to cough somehting out, she has been unable to produce sputum. She denies fevers. She has been treated for possible CAP this hospital admission with ceftriaxone and azithromycin and has been diuresed with lasix. She has incomplete I/O data, but has diuresed off about 10# this admission. She reports not being out of bed since admission on 2/13. She has been on BiPAP since yesterday. Viral PCRs all negative for URI. PCCM was consulted for evaluation of respiratory failure this evening.  Pertinent  Medical History  HFpEF Tobacco abuse- quit 2009 after ~ 30 years GERD HTN HLD DM Mild OSA (HSAT 2019- AHI 6.9)  Significant Hospital Events: Including procedures, antibiotic start and stop dates in addition to other pertinent events   2/13 admission after fall, treated for cellulitis 2/16 started back on ceftriaxone, azithromycin for CAP 2/17 started on bipap  Interim History / Subjective:  Patient is eager to get off of BiPAP. She says she feels much better this morning than in days prior and was able to sleep well, breathing feels better.  Objective   Blood pressure (!) 126/93, pulse 74, temperature 99 F (37.2 C), temperature source Axillary, resp. rate (!) 22,  height 5' 3"$  (1.6 m), weight (!) 145.5 kg, SpO2 93 %.    FiO2 (%):  [50 %] 50 %   Intake/Output Summary (Last 24 hours) at 02/08/2023 0743 Last data filed at 02/08/2023 0600 Gross per 24 hour  Intake 110.28 ml  Output 5000 ml  Net -4889.72 ml   Filed Weights   02/05/23 1050 02/06/23 1414 02/07/23 0355  Weight: (!) 159.2 kg (!) 146.6 kg (!) 145.5 kg    Examination: Constitutional:Obese female on [place]. In no acute distress. Cardio:Regular rate and rhythm. No murmurs, rubs, or gallops. Pulm:Clear to auscultation bilaterally. Normal work of breathing on [place].  Abdomen:Soft, nontender. [id number] for extremity edema. Skin:Warm and dry. Bilateral legs with ace wrap over distal extremity without increased warmth/erythema. Neuro:Alert and oriented x3. No focal deficit noted. Psych:Pleasant mood and affect.  Resolved Hospital Problem list   Acute respiratory failure  Assessment & Plan:  Acute on chronic respiratory failure with hypoxia and hypercapnia Suspect a component of OHS at baseline complicated by more likely acute cardiogenic pulmonary edema rather than multifocal pneumonia. She has been afebrile without leukocytosis. She has completed course of azithromycin and today is day 4 of ceftriaxone. She received lasix 80 mg IV q8h overnight; I/O net -4.9L 02/18. BNP elevated to 216.5, procalcitonin <0.10. She is subjectively improved and tolerating oxygen titration well. Plan: -Recommend rechecking BNP in a few days -Titrate oxygen supplementation as tolerated -Continue brovana, yupelri, duonebs, albuterol -Lasix 80 mg IV q8h for 3 doses, will get additional dose tonight -Continue BiPAP PRN; wean to  HHFNC as tolerated -Discontinue solumedrol  Cellulitis In setting of lymphadenitis and morbid obesity. Previously on cefazolin which was transitioned to ceftriaxone and azithromycin 02/16 for additional pneumonia coverage.  Plan: -Discontinue ceftriaxone -Unna boot    DM Plan: -Continue SSI, goal BG 140-180   Obesity OSA, mild Likely contributing to multiple comorbidities including lymphadenitis, venous insufficiency, OSA. Plan: -Recommend weight loss -BiPAP  P/w mechanical fall 2/2 physical deconditioning Plan: -Tylenol 1000 mg q8h -Oxycodone 5 mg q6h PRN breakthrough pain -Continue to work with PT/OT -Pending SNF discharge once stable  Intertrigo Plan: -Clotrimazole cream BID  Hypertension BP stable on home metoprolol 25 mg daily. Plan: -Continue home metoprolol  CKD stage 3b Renal function stable with BUN/Cr 25/0.93, GFR >60. I wonder if she in fact does not have CKD stage 3b.  -Trend BMP -Avoid nephrotoxic agents  Hx anxiety Hx depression Plan: -Continue home citalopram -Continue home klonopin PRN  Hx restless legs syndrome -Continue home pramipexole Best Practice (right click and "Reselect all SmartList Selections" daily)   Diet/type: Regular consistency (see orders) DVT prophylaxis: LMWH GI prophylaxis: PPI Lines: N/A Foley:  N/A Code Status:  full code Last date of multidisciplinary goals of care discussion [02/19]  Labs   CBC: Recent Labs  Lab 02/02/23 0735 02/03/23 0210 02/05/23 1011 02/06/23 0410 02/06/23 1753 02/07/23 0946 02/08/23 0106  WBC 10.4 6.7 9.4 9.4  --  8.8 7.0  NEUTROABS 8.3*  --  7.4  --   --   --  6.2  HGB 11.2* 10.2* 10.5* 10.0* 11.2* 11.2* 11.2*  HCT 33.7* 30.6* 33.4* 31.9* 33.0* 35.5* 35.6*  MCV 88.2 88.2 93.0 93.5  --  92.4 91.5  PLT 233 204 216 212  --  252 [id number]    Basic Metabolic Panel: Recent Labs  Lab 02/03/23 0210 02/04/23 0040 02/05/23 1011 02/06/23 0410 02/06/23 1753 02/07/23 0005 02/07/23 1606 02/08/23 0106  NA 137   < > 141 140 138 137 140 140  K 3.1*   < > 3.8 4.8 4.6 4.3 4.4 4.2  CL 96*   < > 99 99  --  98 97* 100  CO2 31   < > 32 34*  --  33* 35* 28  GLUCOSE 136*   < > 146* 132*  --  93 115* 152*  BUN 19   < > 13 14  --  18 20 25*  CREATININE 1.27*   < >  1.06* 1.10*  --  0.89 0.89 0.93  CALCIUM 8.4*   < > 8.0* 8.3*  --  8.3* 8.7* 8.5*  MG 2.1  --   --   --   --   --   --  2.4  PHOS  --   --   --   --   --   --   --  4.2   < > = values in this interval not displayed.   GFR: Estimated Creatinine Clearance: 85.3 mL/min (by C-G formula based on SCr of 0.93 mg/dL). Recent Labs  Lab 02/05/23 1011 02/06/23 0410 02/07/23 0946 02/07/23 1806 02/08/23 0106  PROCALCITON  --   --   --  <0.10 <0.10  WBC 9.4 9.4 8.8  --  7.0    Liver Function Tests: Recent Labs  Lab 02/02/23 0735  AST 20  ALT 17  ALKPHOS 66  BILITOT 1.5*  PROT 6.8  ALBUMIN 3.5   No results for input(s): "LIPASE", "AMYLASE" in the last 168 hours. No results for input(s): "AMMONIA" in  the last 168 hours.  ABG    Component Value Date/Time   PHART 7.45 02/06/2023 1800   PCO2ART 58 (H) 02/06/2023 1800   PO2ART 72 (L) 02/06/2023 1800   HCO3 40.3 (H) 02/06/2023 1800   TCO2 35 (H) 02/06/2023 1753   O2SAT 98.1 02/06/2023 1800     Coagulation Profile: No results for input(s): "INR", "PROTIME" in the last 168 hours.  Cardiac Enzymes: No results for input(s): "CKTOTAL", "CKMB", "CKMBINDEX", "TROPONINI" in the last 168 hours.  HbA1C: Hgb A1c MFr Bld  Date/Time Value Ref Range Status  02/02/2023 01:25 PM 6.5 (H) 4.8 - 5.6 % Final    Comment:    (NOTE) Pre diabetes:          5.7%-6.4%  Diabetes:              >6.4%  Glycemic control for   <7.0% adults with diabetes   01/30/2022 01:42 PM 6.9 (H) 4.6 - 6.5 % Final    Comment:    Glycemic Control Guidelines for People with Diabetes:Non Diabetic:  <6%Goal of Therapy: <7%Additional Action Suggested:  >8%     CBG: Recent Labs  Lab 02/07/23 1359 02/07/23 1625 02/07/23 2051 02/07/23 2238 02/08/23 0339  GLUCAP 127* 110* 108* 136* 149*    Review of Systems:   Negative unless otherwise stated.  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   Surgical History:   Past Surgical History:  Procedure Laterality Date   ABDOMINAL HYSTERECTOMY  1999   1 ovary left   bloo clot removed from neck   may 25th , june 2. 2010   [place] SURGERY  may 24th 2010   COLONOSCOPY WITH PROPOFOL N/A 07/10/2016   Procedure: COLONOSCOPY WITH PROPOFOL;  Surgeon: Manus Gunning, MD;  Location: [facility];  Service: Gastroenterology;  Laterality: N/A;   COLONOSCOPY WITH PROPOFOL N/A 09/26/2019   Procedure: COLONOSCOPY WITH PROPOFOL;  Surgeon: Yetta Flock, MD;  Location: [facility];  Service: Gastroenterology;  Laterality: N/A;   ESOPHAGOGASTRODUODENOSCOPY (EGD) WITH PROPOFOL N/A 07/10/2016   Procedure: ESOPHAGOGASTRODUODENOSCOPY (EGD) WITH PROPOFOL;  Surgeon: Manus Gunning, MD;  Location: [facility];  Service: Gastroenterology;  Laterality: N/A;   POLYPECTOMY  09/26/2019   Procedure: POLYPECTOMY;  Surgeon: Yetta Flock, MD;  Location: [facility];  Service: Gastroenterology;;   s/p ovary cyst     s/p right knee arthroscopy     Dr. Mardelle Matte ortho     Social History:   reports that she quit smoking about 14 years ago. Her smoking use included cigarettes. She has never used smokeless tobacco. She reports that she does not drink alcohol and does not use drugs.   Family History:  Her family history includes Anxiety disorder in her sister; Asthma in her daughter and  sister; Bipolar disorder in her daughter; Cancer in her maternal uncle and another family member; Depression in her sister; Diabetes in her mother; Heart disease in her mother; Heart failure in her mother; Hypertension in her father and mother; Liver cancer in her paternal grandmother.   Allergies Allergies  Allergen Reactions   Amoxicillin-Pot Clavulanate Nausea And  Vomiting    Projectile vomiting Did it involve swelling of the face/tongue/throat, SOB, or low BP? No Did it involve sudden or severe rash/hives, skin peeling, or any reaction on the inside of your mouth or nose? No Did you need to seek medical attention at a hospital or doctor's office? No When did it last happen?      20-30 years If all above answers are "NO", may proceed with cephalosporin use.    Adhesive [Tape] Other (See Comments)    Tears skin off - use paper tape    Codeine Other (See Comments)    hallucinations   Crestor [Rosuvastatin Calcium]     Severe muscle cramps   Lyrica [Pregabalin] Other (See Comments)    weakness   Morphine And Related Nausea And Vomiting and Other (See Comments)    Migraine headaches   Vicodin [Hydrocodone-Acetaminophen] Other (See Comments)    hallucinations   Latex Rash     Home Medications  Prior to Admission medications   Medication Sig Start Date End Date Taking? Authorizing Provider  albuterol (PROAIR HFA) 108 (90 Base) MCG/ACT inhaler Inhale 2 puffs into the lungs every 6 (six) hours as needed for wheezing or shortness of breath. 11/05/20  Yes Colin Benton R, DO  cefdinir (OMNICEF) 300 MG capsule Take 1 capsule (300 mg total) by mouth 2 (two) times daily. 01/23/23  Yes Azucena Cecil, PA-C  cholecalciferol (VITAMIN D3) 25 MCG (1000 UNIT) tablet Take 2,000 Units by mouth daily.   Yes [provider]  citalopram (CELEXA) 20 MG tablet Take 1 tablet (20 mg total) by mouth daily. 11/02/22  Yes Biagio Borg, MD  clonazePAM (KLONOPIN) 1 MG tablet Take 1 tablet (1 mg  total) by mouth 2 (two) times daily as needed for anxiety. for anxiety 12/09/22  Yes Biagio Borg, MD  cyclobenzaprine (FLEXERIL) 10 MG tablet TAKE 1 TABLET BY MOUTH 2 TIMES A DAY AS NEEDED FOR MUSCLE SPASMS Patient taking differently: Take 10 mg by mouth 2 (two) times daily as needed for muscle spasms. 02/27/21  Yes Biagio Borg, MD  FARXIGA 5 MG TABS tablet Take 5 mg by mouth daily. 07/06/22  Yes [provider]  fluticasone (FLONASE) 50 MCG/ACT nasal spray Place 1 spray into both nostrils daily. 12/07/22  Yes [provider]  ibuprofen (ADVIL) 800 MG tablet Take 800 mg by mouth every 8 (eight) hours as needed for mild pain. 01/15/23  Yes [provider]  Metamucil Fiber CHEW Chew 3 tablets by mouth at bedtime.   Yes [provider]  metolazone (ZAROXOLYN) 5 MG tablet Take 5 mg by mouth 2 (two) times a week. Per nephrologist on Wed and Sunday   Yes Claudia Desanctis, MD  metoprolol tartrate (LOPRESSOR) 25 MG tablet Take 25 mg by mouth in the morning.   Yes [provider]  ondansetron (ZOFRAN ODT) 4 MG disintegrating tablet Take 1 tablet (4 mg total) by mouth every 4 (four) hours as needed for nausea or vomiting. 08/23/19  Yes Charlesetta Shanks, MD  Oxycodone HCl 10 MG TABS Take 1 tablet (10 mg total) 4 (four) times daily by mouth. Per [facility] Patient taking differently: Take 10 mg by mouth every 4 (four) hours as needed (pain). Per [facility] 11/08/17  Yes Biagio Borg, MD  pantoprazole (PROTONIX) 40 MG tablet TAKE ONE TABLET BY MOUTH DAILY Patient taking differently: Take 40 mg by mouth daily. 01/29/22  Yes Biagio Borg, MD  Polyethyl Glycol-Propyl Glycol 0.4-0.3 % SOLN Place 1 drop into both eyes daily as needed (dry eyes).   Yes [provider]  potassium chloride SA (KLOR-CON M) 20 MEQ tablet Take 4 tablets (80 mEq total) by mouth 2 (two) times daily. Patient taking differently: Take 40 mEq by mouth 2 (two) times daily.  09/01/22  Yes Minus Breeding, MD  pramipexole (MIRAPEX) 0.5 MG tablet Take 1 tablet (0.5 mg total) by mouth 3 (three) times daily. 06/24/22  Yes Biagio Borg, MD  spironolactone (ALDACTONE) 25 MG tablet Take 0.5 tablets (12.5 mg total) by mouth daily. Patient taking differently: Take 25 mg by mouth daily. 08/14/22  Yes Finis Bud, NP  torsemide (DEMADEX) 20 MG tablet Take 3 tablets (60 mg total) by mouth 2 (two) times daily. 09/01/22  Yes Minus Breeding, MD  Blood Glucose Monitoring Suppl (ACCU-CHEK GUIDE) w/Device KIT Use as directed Patient taking differently: 1 each by Other route as directed. 04/11/21   Elodia Florence., MD  glucose blood (ACCU-CHEK GUIDE) test strip Use as instructed up to 4 times daily 04/11/21   Elodia Florence., MD  [facility] 4 MG/0.1ML LIQD nasal spray kit Place 0.4 mg into the nose daily as needed (opioid overdose). 04/25/19   [provider]  DULoxetine (CYMBALTA) 60 MG capsule Take 60 mg by mouth daily.    03/09/12  [provider]     Critical care time: 72 minutes    Farrel Gordon, DO Internal Medicine PGY-2 [phone]

## 2023-02-09 ENCOUNTER — Inpatient Hospital Stay (HOSPITAL_COMMUNITY): Payer: 59

## 2023-02-09 DIAGNOSIS — J9621 Acute and chronic respiratory failure with hypoxia: Secondary | ICD-10-CM | POA: Diagnosis not present

## 2023-02-09 DIAGNOSIS — I501 Left ventricular failure: Secondary | ICD-10-CM | POA: Diagnosis not present

## 2023-02-09 DIAGNOSIS — I5030 Unspecified diastolic (congestive) heart failure: Secondary | ICD-10-CM

## 2023-02-09 DIAGNOSIS — R0603 Acute respiratory distress: Secondary | ICD-10-CM

## 2023-02-09 DIAGNOSIS — J9622 Acute and chronic respiratory failure with hypercapnia: Secondary | ICD-10-CM | POA: Diagnosis not present

## 2023-02-09 LAB — ECHOCARDIOGRAM COMPLETE
AR max vel: 2.12 cm2
AV Area VTI: 2.35 cm2
AV Area mean vel: 2.16 cm2
AV Mean grad: 6 mmHg
AV Peak grad: 14.6 mmHg
Ao pk vel: 1.91 m/s
Area-P 1/2: 1.89 cm2
Height: 63 in
MV VTI: 1.94 cm2
S' Lateral: 2.9 cm
Weight: 5453.26 oz

## 2023-02-09 LAB — GLUCOSE, CAPILLARY
Glucose-Capillary: 205 mg/dL — ABNORMAL HIGH (ref 70–99)
Glucose-Capillary: 101 mg/dL — ABNORMAL HIGH (ref 70–99)
Glucose-Capillary: 128 mg/dL — ABNORMAL HIGH (ref 70–99)
Glucose-Capillary: 120 mg/dL — ABNORMAL HIGH (ref 70–99)
Glucose-Capillary: 153 mg/dL — ABNORMAL HIGH (ref 70–99)

## 2023-02-09 LAB — CBC
HCT: 34.3 % — ABNORMAL LOW (ref 36.0–46.0)
Hemoglobin: 11.1 g/dL — ABNORMAL LOW (ref 12.0–15.0)
MCH: 29 pg (ref 26.0–34.0)
MCHC: 32.4 g/dL (ref 30.0–36.0)
MCV: 89.6 fL (ref 80.0–100.0)
Platelets: 271 10*3/uL (ref 150–400)
RBC: 3.83 MIL/uL — ABNORMAL LOW (ref 3.87–5.11)
RDW: 14.6 % (ref 11.5–15.5)
WBC: 9.4 10*3/uL (ref 4.0–10.5)
nRBC: 0 % (ref 0.0–0.2)

## 2023-02-09 LAB — BASIC METABOLIC PANEL
Anion gap: 7 (ref 5–15)
BUN: 29 mg/dL — ABNORMAL HIGH (ref 8–23)
CO2: 34 mmol/L — ABNORMAL HIGH (ref 22–32)
Calcium: 8.4 mg/dL — ABNORMAL LOW (ref 8.9–10.3)
Chloride: 97 mmol/L — ABNORMAL LOW (ref 98–111)
Creatinine, Ser: 1.06 mg/dL — ABNORMAL HIGH (ref 0.44–1.00)
GFR, Estimated: 58 mL/min — ABNORMAL LOW (ref >60)
Glucose, Bld: 126 mg/dL — ABNORMAL HIGH (ref 70–99)
Potassium: 4.1 mmol/L (ref 3.5–5.1)
Sodium: 138 mmol/L (ref 135–145)

## 2023-02-09 LAB — PROCALCITONIN: Procalcitonin: <0.1 ng/mL

## 2023-02-09 MED ORDER — HYDROMORPHONE HCL 1 MG/ML IJ SOLN
0.5000 mg | Freq: Three times a day (TID) | INTRAMUSCULAR | Status: DC | PRN
  Administered 2023-02-09 – 2023-02-12 (×7): 0.5 mg via INTRAVENOUS
  Filled 2023-02-09 (×7): qty 0.5

## 2023-02-09 MED ORDER — OXYCODONE HCL 5 MG PO TABS
10.0000 mg | ORAL_TABLET | Freq: Four times a day (QID) | ORAL | Status: DC | PRN
  Administered 2023-02-11 – 2023-02-12 (×5): 10 mg via ORAL
  Filled 2023-02-09 (×5): qty 2

## 2023-02-09 MED ORDER — POLYETHYLENE GLYCOL 3350 17 G PO PACK: 17.0000 g | PACK | Freq: Every day | ORAL | Status: DC

## 2023-02-09 MED ORDER — CLONAZEPAM 0.5 MG PO TABS
1.0000 mg | ORAL_TABLET | Freq: Two times a day (BID) | ORAL | Status: DC | PRN
  Administered 2023-02-10: 1 mg via ORAL
  Filled 2023-02-09: qty 2

## 2023-02-09 MED ORDER — FUROSEMIDE 10 MG/ML IJ SOLN
80.0000 mg | Freq: Three times a day (TID) | INTRAMUSCULAR | Status: AC
  Administered 2023-02-09 (×3): 80 mg via INTRAVENOUS
  Filled 2023-02-09 (×3): qty 8

## 2023-02-09 MED ORDER — SENNOSIDES-DOCUSATE SODIUM 8.6-50 MG PO TABS: 1.0000 | ORAL_TABLET | Freq: Every day | ORAL | Status: DC

## 2023-02-09 MED ORDER — SENNOSIDES-DOCUSATE SODIUM 8.6-50 MG PO TABS: 1.0000 | ORAL_TABLET | Freq: Every evening | ORAL | Status: DC | PRN

## 2023-02-09 NOTE — TOC Progression Note (Signed)
Transition of Care [facility]) - Progression Note    Patient Details  Name: Maria [name] MRN: [id number] Date of Birth: [birth year]  Transition of [facility]) CM/SW Contact  Bjorn Pippin, LCSW Phone Number: 02/09/2023, 1:37 PM  Clinical Narrative:    Per MD pt is not ready for dc due to being on high levels on oxygen. CSW will continue to follow pt and will start insurance auth for SNF when appropriate.    Expected Discharge Plan: Skilled Nursing Facility Barriers to Discharge: Continued Medical Work up  Expected Discharge Plan and Services       Living arrangements for the past 2 months: Apartment                                       Social Determinants of Health (SDOH) Interventions [place]: Food Insecurity Present (02/02/2023)  Housing: Low Risk  (02/02/2023)  Transportation Needs: Unmet Transportation Needs (02/02/2023)  Utilities: Not At Risk (02/02/2023)  Alcohol Screen: Low Risk  (12/30/2021)  Depression (PHQ2-9): Medium Risk (01/30/2022)  Financial Resource Strain: Medium Risk (12/30/2021)  Physical Activity: Inactive (12/30/2021)  Social Connections: Socially Isolated (12/30/2021)  Stress: No Stress Concern Present (12/30/2021)  Tobacco Use: Medium Risk (02/02/2023)    Readmission Risk Interventions    07/22/2022    2:59 PM 04/11/2021   11:40 AM  Readmission Risk Prevention Plan  Transportation Screening Complete Complete  PCP or Specialist Appt within 3-5 Days Complete Complete  HRI or [place] Complete Complete  Social Work Consult for [place] Planning/Counseling Complete Complete  Palliative Care Screening Complete Not Applicable  Medication Review Press photographer) Complete Complete   Beckey Rutter, MSW, LCSWA, LCASA Transitions of Care  Clinical Social Worker I

## 2023-02-09 NOTE — Progress Notes (Signed)
HD#6 Subjective:   Summary: Maria [name] is a 67 y/o person living with a history of HFpEF, chronic lymphadenitis, morbid obesity, HTN, CKD stage IIIB, GERD, DDD, anxiety and depression, diabetes, and HLD who presented with left lower extremity cellulitis and also HFpEF exacerbation  Overnight Events: transferred from ICU to stepdown   Resting in bed comfortably with HFNC in place. Overall feels breathing and lower extremity swelling has improved. Wanting to try and eat this am.   Objective:  Vital signs in last 24 hours: Vitals:   02/09/23 0113 02/09/23 0133 02/09/23 0500 02/09/23 0633  BP:  126/63  139/61  Pulse:  64  68  Resp:  (!) 21  20  Temp:  98.6 F (37 C)  98.2 F (36.8 C)  TempSrc:  Oral  Oral  SpO2: 97% 94%  98%  Weight:   (!) 154.6 kg   Height:       Supplemental O2: HFNC SpO2: 98 % O2 Flow Rate (L/min): 18 L/min FiO2 (%): (S) 30 %   Physical Exam:  Constitutional: chronically ill appearing, no acute distress HENT: normocephalic atraumatic Eyes: conjunctiva non-erythematous Neck: supple Cardiovascular: regular rate and rhythm, 2/6 systolic murmur left upper sternal border. Difficult to assess JVD (mid neck?) Pulmonary/Chest: normal work of breathing on HFNC, anterior lung sounds without wheezing Abdominal: soft, non-tender, non-distended MSK: normal bulk and tone Neurological: alert & oriented x 3 Skin: warm and dry. Lower extremities wrapped with bandage to mid shin Psych: pleasant, tearful at times  [facility] Weights   02/06/23 1414 02/07/23 0355 02/09/23 0500  Weight: (!) 146.6 kg (!) 145.5 kg (!) 154.6 kg     Intake/Output Summary (Last 24 hours) at 02/09/2023 0705 Last data filed at 02/09/2023 0137 Gross per 24 hour  Intake 175.83 ml  Output 1250 ml  Net -1074.17 ml   Net IO Since Admission: [id number] mL [02/09/23 0705]  Pertinent Labs:    Latest Ref Rng & Units 02/09/2023    1:11 AM 02/08/2023    1:06 AM 02/07/2023    9:46 AM  CBC  WBC 4.0  - 10.5 K/uL 9.4  7.0  8.8   Hemoglobin 12.0 - 15.0 g/dL 11.1  11.2  11.2   Hematocrit 36.0 - 46.0 % 34.3  35.6  35.5   Platelets 150 - 400 K/uL 271  252  252        Latest Ref Rng & Units 02/09/2023    1:11 AM 02/08/2023    1:06 AM 02/07/2023    4:06 PM  CMP  Glucose 70 - 99 mg/dL 126  152  115   BUN 8 - 23 mg/dL 29  25  20   $ Creatinine 0.44 - 1.00 mg/dL 1.06  0.93  0.89   Sodium 135 - 145 mmol/L 138  140  140   Potassium 3.5 - 5.1 mmol/L 4.1  4.2  4.4   Chloride 98 - 111 mmol/L 97  100  97   CO2 22 - 32 mmol/L 34  28  35   Calcium 8.9 - 10.3 mg/dL 8.4  8.5  8.7     Imaging: No results found.  Assessment/Plan:   Principal Problem:   Fall Active Problems:   Lymphedema   Acute on chronic heart failure with preserved ejection fraction (HFpEF) (HCC)   Chronic right-sided congestive heart failure ([place])   Pneumonia due to infectious organism   Acute on chronic respiratory failure with hypoxia and hypercapnia [facility])  Patient Summary: Maria [name] is  a 67 year old female with a past medical history of morbid obesity, hypertension, hyperlipidemia, chronic diastolic heart failure, chronic lymphadenitis, CKD IIIb, and DDD who presented after a mechanical fall at home, admitted for management of lower extremity cellulitis and chronic lymphadenitis. Her hospital course was complicated by acute hypoxic/hypercarbic respiratory failure requiring transfer to the ICU for further care and continuous BiPAP. She improved with adequate diuresis and was transferred back to our service 2/20  Acute on chronic respiratory failure with hypoxia and hypercapnia.  HFpEF exacerbation w/ pulmonary edema NYHA III Breathing has improved, still on HFNC w/ Fio2 of 30%. Difficult to assess volume status with body habitus but bed weight of 340 lbs (last discharge dry weight of 346lbs). Net negative 5L in last two days. Echocardiogram pending. Will continue IV diuretics today and can transition to home torsemide tomorrow.  Can restart farxiga tomorrow. Takes metolazone Wed/Sunday as well.  -continue IV lasix 80 mg TID, transition to PO torsemide tomorrow if doing well -TTE pending -restart farxiga tomorrow as renal function allows  -BMP and mag tomorrow, renal function at baseline -consider restarting bi weekly metolazone tomorrow -strict I/O and daily weights -wean oxygen as able, O2 goal of over 92%.  -monitor breathing closely with chronic benzodiazepine and opioid use  Wheezing Primary team assessed this morning and able to listen posteriorly, noted to have diffuse wheezing. No formal PFT's on chart review, but likely underlying pulmonary disease. Sleep studies in the past per the patient have been negative, on chart review one sleep study with mild OSA. Would recommend repeat study outpatient. Has PRN albuterol she uses at home. Will schedule duonebs do not believe steroids necessary at this time.  -albuterol PRN, duonebs q6h  -continue brovana BID and yupelri daily -IS/flutter valve -would recommend outpatient PFT and repeat sleep study  Lower extremity cellulitis Chronic lymphadenitis Completed 7 day course of abx yesterday, 2/19. Legs wrapped currently, discussed with nursing to reach out to team when changing bandage to assess cellulitis. Afebrile and without leukocytosis.  -continue compression wraps -unna boot once cellulitis resolves -monitor for recurrent cellulitis -keep legs elevated  CKD IIIB Follows with Dr. Royce Macadamia of [facility], baseline Cr 1.1-1.3. Diuresisng well -montior kidney function daily -trend electrolytes, continue potassium 40 meq BID -avoid nephrotoxic medications  Mechanical fall Deconditioning Continue to work with PT, will need continued support outpatient as lives home alone and was functional outpatient with use of walker/cane.  -continue to work with PT/OT, appreciate their assistance  Chronic pain Multifactorial with history of fibromyalgia and degenerative  disc disease. Home regime of oxycodone 10 mg q4h. Continue home oxycodone 10 mg q6h PRN and dilaudid for breakthrough pain. Try to wean off dilaudid in next 24 hours.  -oxycodone 10 mg q4h, increased from 5 mg -increase time between dilaudid dosing to every 8 hours for breakthrough pain -BM yesterday, continue senna prn  T2DM -continue SSI -restart SGLT-2i tomorrow  HTN Continue metoprolol 25 mg daily. HR within goal and BP normotensive  Depression/Anxiety -continue celexa 20 mg and clonazepam 55m q12h PRN  Restless leg syndrome -continue pramipexole .[id number]  Interigo -continue clotrimazole cream q12 -reassess in 1-2 weeks   Diet: Carb-Modified IVF: None,None VTE: Enoxaparin Code: Full PT/OT recs: SNF for Subacute PT,.  Dispo: Anticipated discharge to Skilled nursing facility in 2 days pending further improvement in respiratory status.   VaGolden Valleynternal Medicine Resident PGY-3 Please contact the on call pager after 5 pm and on weekends at [phone]

## 2023-02-09 NOTE — Progress Notes (Signed)
*  PRELIMINARY RESULTS* Echocardiogram 2D Echocardiogram has been performed.  Maria [name] 02/09/2023, 11:33 AM

## 2023-02-09 NOTE — Progress Notes (Signed)
Occupational Therapy Treatment Patient Details Name: Maria [name] MRN: [id number] DOB: 05/31/1956 Today's Date: 02/09/2023   History of present illness Pt is a 67 y/o female presenting after a fall at home. Negative for acute fx. Admitted for pain control s/p fall, LE edema and LE cellulitis. RR for respiratory distress on 2/17, transitioned to ICU and started treatment for PNA, placed on bipap, as of 2/19 on HHFNC. PMH: chronic lymphadenitis, HTN, CKD, CHF, GERD, DDD, anxiety, DM, HLD, depression   OT comments  Pt seen for first OT session since recent ICU transfer and newly required [place]. Focus on standing endurance and trial of Stedy with pt able to complete with Min A x 1. Pt with purewick malfunction, required Total A for pericare standing in [place] along with bed linen change. Pt able to stand > 5 min with SpO2 87% and above and 18 L, 30% FiO2. Pt remains eager to participate and hopeful to regain independence with ST SNF rehab stay.   Recommendations for follow up therapy are one component of a multi-disciplinary discharge planning process, led by the attending physician.  Recommendations may be updated based on patient status, additional functional criteria and insurance authorization.    Follow Up Recommendations  Skilled nursing-short term rehab (<3 hours/day)     Assistance Recommended at Discharge Intermittent Supervision/Assistance  Patient can return home with the following  A lot of help with walking and/or transfers;A lot of help with bathing/dressing/bathroom;Assistance with cooking/housework;Assist for transportation;Help with stairs or ramp for entrance   Equipment Recommendations  None recommended by OT    Recommendations for Other Services      Precautions / Restrictions Precautions Precautions: Fall;Other (comment) Precaution Comments: monitor O2 Restrictions Weight Bearing Restrictions: No       Mobility Bed Mobility Overal bed mobility: Needs  Assistance Bed Mobility: Supine to Sit, Sit to Supine     Supine to sit: Min assist, HOB elevated Sit to supine: Min guard   General bed mobility comments: Min A to lift trunk to EOB, good use of bedrails. Pt able to swing BLE back into bed using momentum    Transfers Overall transfer level: Needs assistance Equipment used: Ambulation equipment used Transfers: Sit to/from Stand Sit to Stand: Min assist           General transfer comment: Min A to stand in [place] at bedside, able to stand from [employer] without assist     Balance Overall balance assessment: Needs assistance Sitting-balance support: Feet supported Sitting balance-Leahy Scale: Fair     Standing balance support: Bilateral upper extremity supported Standing balance-Leahy Scale: Poor                             ADL either performed or assessed with clinical judgement   ADL Overall ADL's : Needs assistance/impaired Eating/Feeding: Independent   Grooming: Set up;Sitting;Wash/dry face                       Toileting- Clothing Manipulation and Hygiene: Total assistance;Sit to/from stand Toileting - Clothing Manipulation Details (indicate cue type and reason): for peri care assist standing in [place] after purewick malfunction       General ADL Comments: Focus on standing tolerance with Stedy assessment with new HHFNC requirements    Extremity/Trunk Assessment Upper Extremity Assessment Upper Extremity Assessment: Overall WFL for tasks assessed   Lower Extremity Assessment Lower Extremity Assessment: Defer to PT evaluation  Vision   Vision Assessment?: No apparent visual deficits   Perception     Praxis      Cognition Arousal/Alertness: Awake/alert Behavior During Therapy: WFL for tasks assessed/performed Overall Cognitive Status: Within Functional Limits for tasks assessed                                          Exercises      Shoulder  Instructions       General Comments purewick in proper place though noted urine in tube not being suctioned to wall. Bed completely soaked; repositioned suction tube out of hip/LE crevice with improved suction noted.    Pertinent Vitals/ Pain       Pain Assessment Pain Assessment: No/denies pain  Home Living                                          Prior Functioning/Environment              Frequency  Min 2X/week        Progress Toward Goals  OT Goals(current goals can now be found in the care plan section)  Progress towards OT goals: Progressing toward goals  Acute Rehab OT Goals Patient Stated Goal: go to rehab, regain independence OT Goal Formulation: With patient Time For Goal Achievement: 02/17/23 Potential to Achieve Goals: Good ADL Goals Pt Will Perform Lower Body Bathing: with min guard assist;sit to/from stand;with adaptive equipment Pt Will Perform Lower Body Dressing: with min guard assist;sit to/from stand;with adaptive equipment Pt Will Transfer to Toilet: with min guard assist;ambulating Additional ADL Goal #1: Pt to increase standing activity tolerance > 5 min during ADLs/mobility  Plan Discharge plan remains appropriate    Co-evaluation                 AM-PAC OT "6 Clicks" Daily Activity     Outcome Measure   Help from another person eating meals?: None Help from another person taking care of personal grooming?: A Little Help from another person toileting, which includes using toliet, bedpan, or urinal?: Total Help from another person bathing (including washing, rinsing, drying)?: A Lot Help from another person to put on and taking off regular upper body clothing?: A Little Help from another person to put on and taking off regular lower body clothing?: A Lot 6 Click Score: 15    End of Session Equipment Utilized During Treatment: Oxygen  OT Visit Diagnosis: Unsteadiness on feet (R26.81);Other abnormalities of gait  and mobility (R26.89);Muscle weakness (generalized) (M62.81)   Activity Tolerance Patient tolerated treatment well   Patient Left in bed;with call bell/phone within reach   Nurse Communication Mobility status (discussed with NT; purewick issues)        Time: [id number] OT Time Calculation (min): 31 min  Charges: OT General Charges $OT Visit: 1 Visit OT Treatments $Self Care/Home Management : 23-37 mins  Malachy Chamber, OTR/L Acute Rehab Services Office: [phone]   Layla Maw 02/09/2023, 10:00 AM

## 2023-02-10 LAB — BASIC METABOLIC PANEL
Anion gap: 10 (ref 5–15)
BUN: 28 mg/dL — ABNORMAL HIGH (ref 8–23)
CO2: 32 mmol/L (ref 22–32)
Calcium: 8.5 mg/dL — ABNORMAL LOW (ref 8.9–10.3)
Chloride: 97 mmol/L — ABNORMAL LOW (ref 98–111)
Creatinine, Ser: 0.98 mg/dL (ref 0.44–1.00)
GFR, Estimated: >60 mL/min (ref >60)
Glucose, Bld: 109 mg/dL — ABNORMAL HIGH (ref 70–99)
Potassium: 3.7 mmol/L (ref 3.5–5.1)
Sodium: 139 mmol/L (ref 135–145)

## 2023-02-10 LAB — CBC WITH DIFFERENTIAL/PLATELET
Abs Immature Granulocytes: 0.03 10*3/uL (ref 0.00–0.07)
Basophils Absolute: 0 10*3/uL (ref 0.0–0.1)
Basophils Relative: 0 %
Eosinophils Absolute: 0.3 10*3/uL (ref 0.0–0.5)
Eosinophils Relative: 4 %
HCT: 36.7 % (ref 36.0–46.0)
Hemoglobin: 11.7 g/dL — ABNORMAL LOW (ref 12.0–15.0)
Immature Granulocytes: 0 %
Lymphocytes Relative: 17 %
Lymphs Abs: 1.2 10*3/uL (ref 0.7–4.0)
MCH: 28.8 pg (ref 26.0–34.0)
MCHC: 31.9 g/dL (ref 30.0–36.0)
MCV: 90.4 fL (ref 80.0–100.0)
Monocytes Absolute: 0.6 10*3/uL (ref 0.1–1.0)
Monocytes Relative: 9 %
Neutro Abs: 5 10*3/uL (ref 1.7–7.7)
Neutrophils Relative %: 70 %
Platelets: 280 10*3/uL (ref 150–400)
RBC: 4.06 MIL/uL (ref 3.87–5.11)
RDW: 14.6 % (ref 11.5–15.5)
WBC: 7.2 10*3/uL (ref 4.0–10.5)
nRBC: 0 % (ref 0.0–0.2)

## 2023-02-10 LAB — GLUCOSE, CAPILLARY
Glucose-Capillary: 118 mg/dL — ABNORMAL HIGH (ref 70–99)
Glucose-Capillary: 120 mg/dL — ABNORMAL HIGH (ref 70–99)
Glucose-Capillary: 108 mg/dL — ABNORMAL HIGH (ref 70–99)
Glucose-Capillary: 102 mg/dL — ABNORMAL HIGH (ref 70–99)

## 2023-02-10 LAB — MAGNESIUM: Magnesium: 2.2 mg/dL (ref 1.7–2.4)

## 2023-02-10 MED ORDER — POTASSIUM CHLORIDE 20 MEQ PO PACK
40.0000 meq | PACK | Freq: Once | ORAL | Status: AC
  Administered 2023-02-10: 40 meq via ORAL
  Filled 2023-02-10: qty 2

## 2023-02-10 MED ORDER — SPIRONOLACTONE 25 MG PO TABS
25.0000 mg | ORAL_TABLET | Freq: Every day | ORAL | Status: DC
  Administered 2023-02-10 – 2023-02-12 (×3): 25 mg via ORAL
  Filled 2023-02-10 (×3): qty 1

## 2023-02-10 MED ORDER — DAPAGLIFLOZIN PROPANEDIOL 5 MG PO TABS
5.0000 mg | ORAL_TABLET | Freq: Every day | ORAL | Status: DC
  Administered 2023-02-10 – 2023-02-12 (×3): 5 mg via ORAL
  Filled 2023-02-10 (×3): qty 1

## 2023-02-10 MED ORDER — POTASSIUM CHLORIDE 20 MEQ PO PACK: 40.0000 meq | PACK | Freq: Two times a day (BID) | ORAL | Status: DC

## 2023-02-10 MED ORDER — FUROSEMIDE 10 MG/ML IJ SOLN
80.0000 mg | Freq: Three times a day (TID) | INTRAMUSCULAR | Status: AC
  Administered 2023-02-10 (×3): 80 mg via INTRAVENOUS
  Filled 2023-02-10 (×3): qty 8

## 2023-02-10 NOTE — Progress Notes (Signed)
Physical Therapy Treatment Patient Details Name: Maria [name] MRN: [id number] DOB: [birth year] Today's Date: 02/10/2023   History of Present Illness Pt is a 67 y/o female presenting after a fall at home. Negative for acute fx. Admitted for pain control s/p fall, LE edema and LE cellulitis. RR for respiratory distress on 2/17, transitioned to ICU and started treatment for PNA, placed on bipap, as of 2/19 on HHFNC. PMH: chronic lymphadenitis, HTN, CKD, CHF, GERD, DDD, anxiety, DM, HLD, depression    PT Comments    Pt received in supine, agreeable to therapy session, pt reporting purewick malfunction and bed noted to be saturated with urine, pt requiring hygiene assist and linen change, NT called to room to assist. Pt rolling with up to min guard assist and able to perform transfer from elevated HOB to long sitting with up to minA. Seated tolerance limited due to fatigue and pt dinner arriving after supine LE/UE exercises so defer OOB to chair, pt agreeable to repositioning in bed chair posture for improved pulmonary clearance and digestion while eating dinner. RN notified of purewick malfunction. SpO2 WFL on 15L, FiO2 21%. Plan to work on standing balance and gait progression next session. Pt continues to benefit from PT services to progress toward functional mobility goals.     Recommendations for follow up therapy are one component of a multi-disciplinary discharge planning process, led by the attending physician.  Recommendations may be updated based on patient status, additional functional criteria and insurance authorization.  Follow Up Recommendations  Skilled nursing-short term rehab (<3 hours/day) Can patient physically be transported by private vehicle: No   Assistance Recommended at Discharge Frequent or constant Supervision/Assistance  Patient can return home with the following Two people to help with walking and/or transfers;A lot of help with bathing/dressing/bathroom;Assistance with  cooking/housework;Assist for transportation;Help with stairs or ramp for entrance   Equipment Recommendations  Rolling walker (2 wheels)    Recommendations for Other Services       Precautions / Restrictions Precautions Precautions: Fall;Other (comment) Precaution Comments: monitor O2 Restrictions Weight Bearing Restrictions: No     Mobility  Bed Mobility Overal bed mobility: Needs Assistance Bed Mobility: Supine to Sit, Sit to Supine, Rolling Rolling: Min guard   Supine to sit: Min assist, HOB elevated Sit to supine: Min guard, HOB elevated   General bed mobility comments: Min A to lift trunk to long sitting with pt using bedrails. Pt performed x10 bed "crunches" to long sitting with brief rest break after initial 5 reps, needing up to minA from bed chair posture.    Transfers Overall transfer level: Needs assistance                 General transfer comment: Defer as pt bed saturated with urine initially requiring increased time for clean-up;  after clean-up and supine LE exercises, dinner tray arriving and pt requesting to eat.       Balance Overall balance assessment: Needs assistance Sitting-balance support: Feet supported Sitting balance-Leahy Scale: Fair Sitting balance - Comments: reliant on BUE support to maintain long sitting for ~1 minute at a time. Posterior bias toward elevated bed support while pt long sitting.       Standing balance comment: pt meal tray arriving, pt requesting to eat                            Cognition Arousal/Alertness: Awake/alert Behavior During Therapy: WFL for tasks assessed/performed Overall Cognitive Status:  Within Functional Limits for tasks assessed                                          Exercises General Exercises - Upper Extremity Elbow Flexion: AROM, Both, 10 reps, Seated Elbow Extension: AROM, Both, 10 reps, Seated Digit Composite Flexion: AROM, Both, 10 reps, Seated Composite  Extension: AROM, Both, 10 reps, Seated General Exercises - Lower Extremity Ankle Circles/Pumps: AROM, Both, 10 reps, Supine Short Arc Quad: AROM, Both, 10 reps, Supine (bed chair posture) Heel Slides: AROM, Both, 5 reps, Supine Hip Flexion/Marching: AROM, Both, 10 reps, Seated (bed chair posture) Other Exercises Other Exercises: bed in chair posture "crunches" with BUE support of side rails up to long sitting x10 reps for BUE and core strengthening Other Exercises: supine bridges x 3 reps    General Comments General comments (skin integrity, edema, etc.): purewick in proper place though noted urine in tube not being suctioned to wall. Bed completely soaked; repositioned suction tube out of hip/LE crevice with improved suction noted, pt NT called to room and assisted PT with hygiene assist/peri-care in supine.      Pertinent Vitals/Pain Pain Assessment Pain Assessment: Faces Faces Pain Scale: Hurts little more Pain Location: bilat LEs with heel slides Pain Descriptors / Indicators: Grimacing, Guarding, Sore Pain Intervention(s): Monitored during session, Limited activity within patient's tolerance, Repositioned           PT Goals (current goals can now be found in the care plan section) Acute Rehab PT Goals Patient Stated Goal: to get pain reduced and walk better, better balance PT Goal Formulation: With patient Time For Goal Achievement: 02/17/23 Progress towards PT goals: Progressing toward goals    Frequency    Min 3X/week      PT Plan Current plan remains appropriate       AM-PAC PT "6 Clicks" Mobility   Outcome Measure  Help needed turning from your back to your side while in a flat bed without using bedrails?: A Little Help needed moving from lying on your back to sitting on the side of a flat bed without using bedrails?: A Lot Help needed moving to and from a bed to a chair (including a wheelchair)?: A Lot Help needed standing up from a chair using your arms  (e.g., wheelchair or bedside chair)?: A Lot Help needed to walk in hospital room?: Total Help needed climbing 3-5 steps with a railing? : Total 6 Click Score: 11    End of Session Equipment Utilized During Treatment: Oxygen Activity Tolerance: Patient limited by fatigue;Other (comment) (urinary incontinence/purewick malfunction limiting mobility progress due to time required for clean-up) Patient left: in bed;with call bell/phone within reach;Other (comment) (bed in upright chair posture, pt set up with tray to eat dinner) Nurse Communication: Mobility status;Other (comment) (purewick needs to be checked frequently due to malfunctioning.) PT Visit Diagnosis: Unsteadiness on feet (R26.81);Muscle weakness (generalized) (M62.81);Repeated falls (R29.6);History of falling (Z91.81);Difficulty in walking, not elsewhere classified (R26.2);Pain Pain - part of body: Knee;Ankle and joints of foot;Leg     Time: [id number] PT Time Calculation (min) (ACUTE ONLY): 34 min  Charges:  $Therapeutic Exercise: 8-22 mins $Therapeutic Activity: 8-22 mins                     Jaclin Finks P., PTA Acute Rehabilitation Services Secure Chat Preferred 9a-5:30pm Office: [place]  02/10/2023, 5:26 PM

## 2023-02-10 NOTE — Care Management Important Message (Signed)
Important Message  Patient Details  Name: Maria [name] MRN: [id number] Date of Birth: 1956-08-20   Medicare Important Message Given:  Yes     Shelda Altes 02/10/2023, 10:04 AM

## 2023-02-10 NOTE — Progress Notes (Signed)
Subjective:  Patient feeling pretty good this morning, continues to breathe comfortably on supplemental oxygen. Her legs have decreased in size since arrival. Discussed plan to gradually wean oxygen in preparation for skilled nursing facility.    Objective: Vitals over previous 24hr: Vitals:   02/10/23 0053 02/10/23 0135 02/10/23 0605 02/10/23 0735  BP: (!) 126/44  (!) 142/48 (!) 144/55  Pulse: 61  68 69  Resp: 20  20 (!) 21  Temp: 98.3 F (36.8 C)  98.5 F (36.9 C) 99.3 F (37.4 C)  TempSrc: Oral  Oral Oral  SpO2: 94% 95% 94% 92%  Weight:   (!) 152.1 kg   Height:        General:      awake and alert, lying in bed, cooperative, not in acute distress Skin:       warm and dry, bilateral lower extremities wrapped in compressive dressings from toes to below knees Lungs:      normal respiratory effort, breathing slightly labored, symmetrical chest rise, no crackles or wheezing Cardiac:      regular rate and rhythm, normal S1 and S2, trace pitting edema below knees bilaterally Abdomen:      soft and non-distended, normoactive bowel sounds, no tenderness to palpation or guarding Neurologic:      oriented to person-place-time, moving all extremities, no gross focal deficits Psychiatric:      euthymic mood with congruent affect, intelligible speech  Photographs from 2-20:      Assessment/Plan: Ms Fabela is a 67 year old female with a past medical history of morbid obesity, hypertension, hyperlipidemia, chronic diastolic heart failure, chronic lymphadenitis, CKD IIIb, and DDD who was delivered to the ED after a mechanical fall at home, admitted for management of lower extremity cellulitis and chronic lymphadenitis, and hospital course complicated by pneumonia requiring two days of ICU-level care.    ---Acute hypoxic and hypercapnic respiratory failure ---Left lower lobe pneumonia Upon arrival, chest radiograph notable only for enlarged cardiac silhouette without evidence of  consolidation. Overnight on 2-14 patient developed acute worsening of breath shortness and exam revealed mild expiratory wheezing. Chest radiograph demonstrated new bilateral interstitial and alveolar airspace opacities concerning for multilobar pneumonia. Antibiotic regimen expanded from cefazolin to ceftriaxone plus azithromycin for pneumonia coverage, completed full course on 2-20. Oxygen requirements increased to 12L/min and then escalated to BiPAP requiring ICU-level care. She returned to the general medicine floor on 2-20 and has been breathing 18L/min since arrival, primary team is gradually weaning oxygen. > Ipratropium-albuterol 0.5-2.80m q6 > Arformoterol 15ug q12 > Revefenacin 175ug q24 > Dextromethorphan-guaifenesin 30-6068mq12 > Albuterol 25m325m6 PRN > Supplemental oxygen as needed to maintain SpO2>90 > Chest physiotherapy and flutter valve > Incentive spirometry > Consider outpatient evaluation for underlying pulmonary disease   ---Lower extremity cellulitis ---Chronic lymphadenitis Patient has a history of lymphadenitis and morbid obesity. During her most recent hospitalization, her legs were placed in an Unna boot and she was discharged with home health. She presented on 2-13 with lower extremity swelling and erythema, which has worsened over the last few weeks. Exam upon arrival to the ED notable for significant lymphedema complicated by bilateral lower extremity cellulitis right worse than left. Vitals including temperature were unremarkable and laboratory findings without leukocytosis. Lymphedema likely has component of venous insufficiency secondary to morbid obesity and physical inactivity, heart failure probably contributing as well. Cefazolin started and then switched to ceftriaxone plus azithromycin on 2-16 for additional pneumonia coverage, course completed on 2-20. Area of involvement and swelling  have both improved considerably since antibiotic treatment. > Compressive  wraps, Unna boot once cellulitis resolves > Elevate lower extremities > Consider lymphedema clinic referral upon discharge   ---Heart failure preserved ejection fraction with pulmonary edema Patient has history of diastolic heart failure, prior echocardiogram performed 09-2021 demonstrated ejection fraction 65-70% and grade II diastolic dysfunction. She reports chronic dyspnea on exertion without orthopnea or breath shortness at rest. Upon arrival, chest radiograph negative for vascular congestion and BNP normal. Although the patient takes torsemide 9m at home, she demonstrated confusion about proper dosing and had only been taking one dose daily rather than two as prescribed. Heart failure is likely contributing to lower extremity swelling. Intravenous furosemide precipitated net fluid loss 15.1L since admission. Echocardiogram consistent with prior study plus pericardial effusion and elevated PA-IVC pressures. Home spironolactone resumed today 2-21. > Spironolactone 228mq24 > Dapagliflozin 9m31m24 unless GFR<30 > Furosemide 33m74m q8 > Monitor ins-outs > Trend BMP q24   ---Mechanical fall ---Physical deconditioning [facility] without loss of consciousness or prodromal symptoms. Upon arrival, imaging was negative for acute intracranial process and thoracic-lumbar fracture, but identified mild-moderate degenerative spondylosis. Vitamin D level within normal limits. Etiology of falls is likely multifactorial including pain, increased lower extremity swelling, and physical deconditioning. Occupational and physical therapy teams are recommending discharge to skilled nursing facility for rehabilitation before returning home. > Transitions of care consult for SNF placement > Acetaminophen 1000mg42m> Oxycodone 9mg q32mRN, moderate-severe pain > Hydromorphone 0.9mg q679mN, breakthrough pain > Fall precautions > Physical-occupational  therapy > [facility]   ---Hypertension [facility] of [facility] Since arrival, her blood pressure has been stable on home metoprolol dose. > Metoprolol 29mg q268m---Chronic kidney disease IIIb Patient has history of chronic kidney disease with baseline creatinine roughly around 1.1-1.3 per electronic medical record. Upon arrival, her creatinine was 1.31 and potassium 3.8.  Creatinine has remained stable throughout current hospitalization. > Trend BMP q24 > Avoid nephrotoxic agents   ---Anxiety ---Depression Patient has history of anxiety and depression managed at home with citalopram. She also takes clonazepam twice daily as needed. Anxiety is likely a contributing factor to her respiratory distress, counseling was provided on deep-breathing relaxation techniques. > Citalopram 20mg q2470mlonazepam 1mg q12 P11m  ---Diabetes II ---Obesity III Patient has history of morbid obesity and diabetes, her current BMI is 64. Previous A1C collected 01-2022 was 6.9. Managed at home with dapagliflozin. Upon arrival, her A1C was 6.5. Blood glucose has remained in the 100-200 range throughout current hospitalization. > Sliding scale insulin, sensitive sensitivity  > Dapagliflozin 9mg q24   67mIntertrigo Patient has history of morbid obesity and abdominal pannus. Upon arrival, erythematous patch was present in her inguinal region.  > Clotrimazole cream 1% q12   ---Restless legs syndrome Patient has a history of restless legs syndrome managed at home with pramipexole. > Pramipexole 0.9mg q8   --31myroid multinodular mass Patient has history of thyroid nodule. Fine-needle aspiration of left lower thyroid lobe demonstrated benign hyperplastic nodule classified as Bethesda category 2. Most recent TSH level was 2.35 collected 07-2022. > Monitor clinically     Principal Problem:   Fall Active  Problems:   Acute hypoxic respiratory failure (HCC)   Lymphedema   Acute on chronic heart failure with preserved ejection fraction (HFpEF) (HCC)   Chronic right-sided congestive heart  failure ([place])   Pneumonia due to infectious organism   Acute on chronic respiratory failure with hypoxia and hypercapnia ([place])    Prior to Admission Living Arrangement: home alone Anticipated Discharge Location: [facility] Barriers to Discharge: improvement in respiratory status Dispo: Anticipated discharge in approximately 2-3 day(s).    Roswell Nickel, MD Internal Medicine PGY-1 Pager [phone]  After 5pm on weekdays and 1pm on weekends: On Call pager [phone]

## 2023-02-10 NOTE — Progress Notes (Signed)
No respiratory distress noted. No BIPAP needed at this time.

## 2023-02-11 LAB — GLUCOSE, CAPILLARY
Glucose-Capillary: 95 mg/dL (ref 70–99)
Glucose-Capillary: 106 mg/dL — ABNORMAL HIGH (ref 70–99)
Glucose-Capillary: 90 mg/dL (ref 70–99)
Glucose-Capillary: 95 mg/dL (ref 70–99)

## 2023-02-11 LAB — BASIC METABOLIC PANEL
Anion gap: 12 (ref 5–15)
BUN: 23 mg/dL (ref 8–23)
CO2: 31 mmol/L (ref 22–32)
Calcium: 8.6 mg/dL — ABNORMAL LOW (ref 8.9–10.3)
Chloride: 97 mmol/L — ABNORMAL LOW (ref 98–111)
Creatinine, Ser: 1.15 mg/dL — ABNORMAL HIGH (ref 0.44–1.00)
GFR, Estimated: 53 mL/min — ABNORMAL LOW (ref >60)
Glucose, Bld: 105 mg/dL — ABNORMAL HIGH (ref 70–99)
Potassium: 3.9 mmol/L (ref 3.5–5.1)
Sodium: 140 mmol/L (ref 135–145)

## 2023-02-11 LAB — MAGNESIUM: Magnesium: 2.2 mg/dL (ref 1.7–2.4)

## 2023-02-11 MED ORDER — TORSEMIDE 20 MG PO TABS
60.0000 mg | ORAL_TABLET | Freq: Two times a day (BID) | ORAL | Status: DC
  Administered 2023-02-12: 60 mg via ORAL
  Filled 2023-02-11: qty 3

## 2023-02-11 NOTE — Progress Notes (Signed)
Subjective:  Maria [name] was feeling good this morning, breathing continues to improve and using spirometry frequently. Discussed plan to hold diuretics today and resume oral torsemide tomorrow. Ready for discharge, bed availability pending. Patient is eager to start rehabilitation.    Objective: Vitals over previous 24hr: Vitals:   02/10/23 2130 02/11/23 0015 02/11/23 0510 02/11/23 0518  BP:  135/61  (!) 127/52  Pulse: 66 66  63  Resp: 19 20  17  $ Temp:  98.6 F (37 C)  97.9 F (36.6 C)  TempSrc:  Oral  Oral  SpO2: 97% 93%  95%  Weight:   (!) 149.3 kg   Height:        General:      awake and alert, lying in bed, cooperative, not in acute distress Skin:       warm and dry, bilateral lower extremities wrapped in compressive dressings from toes to below knees Lungs:      normal respiratory effort, breathing slightly labored, symmetrical chest rise, no crackles or wheezing Cardiac:      regular rate and rhythm, normal S1 and S2, no pitting edema Abdomen:      soft and non-distended, normoactive bowel sounds, no tenderness to palpation or guarding Neurologic:      oriented to person-place-time, moving all extremities, no gross focal deficits Psychiatric:      happy mood with congruent affect, slightly anxious, intelligible speech    Assessment/Plan: Maria [name] is a 67 year old female with a past medical history of morbid obesity, hypertension, hyperlipidemia, chronic diastolic heart failure, chronic lymphadenitis, CKD IIIb, and DDD who was delivered to the ED after a mechanical fall at home; admitted for management of lower extremity cellulitis and chronic lymphadenitis; hospital course complicated by pneumonia requiring two days of ICU-level care.    ---Acute hypoxic and hypercapnic respiratory failure ---Left lower lobe pneumonia Upon arrival, chest radiograph notable only for enlarged cardiac silhouette without evidence of consolidation. Overnight on 2-14 patient developed  acute worsening of breath shortness and exam revealed mild expiratory wheezing. Chest radiograph demonstrated new bilateral interstitial and alveolar airspace opacities concerning for multilobar pneumonia. Cellulitis antibiotic regimen expanded from cefazolin to ceftriaxone plus azithromycin for pneumonia coverage, completed full course on 2-20. Oxygen requirements increased to 12L/min and then escalated to BiPAP requiring ICU-level care for two days. She returned to the general medicine floor on 2-20 and oxygen requirements have been weaned since arrival, currently on 2L/min [place]. > Ipratropium-albuterol 0.5-2.4m q6 > Arformoterol 15ug q12 > Revefenacin 175ug q24 > Dextromethorphan-guaifenesin 30-60571mq12 > Albuterol 71m58m6 PRN > Supplemental oxygen as needed to maintain SpO2>90 > Chest physiotherapy and flutter valve > Incentive spirometry > Consider outpatient evaluation for underlying pulmonary disease   ---Lower extremity cellulitis ---Chronic lymphadenitis Patient has a history of lymphadenitis and morbid obesity. During prior hospitalization, her legs were placed in an Unna boot and she was discharged with home health. She presented on 2-13 with lower extremity swelling and erythema, which has worsened over the last few weeks. Exam upon arrival notable for significant lymphedema complicated by bilateral lower extremity cellulitis. Vitals including temperature were unremarkable and laboratory findings without leukocytosis. Lymphedema likely has component of venous insufficiency secondary to morbid obesity and physical inactivity, heart failure probably contributing as well. Cefazolin started and then switched to ceftriaxone plus azithromycin on 2-16 for additional pneumonia coverage, course completed on 2-20. Area of involvement and swelling have both improved considerably since antibiotic treatment. > Compressive wraps, Unna boot once cellulitis resolves >  Elevate lower extremities > Consider  lymphedema clinic referral upon discharge   ---Acute on chronic diastolic heart failure exacerbation Patient has history of heart failure, prior echocardiogram performed 09-2021 demonstrated ejection fraction 65-70% and grade II diastolic dysfunction. She reports chronic dyspnea on exertion without orthopnea or breath shortness at rest. Upon arrival, chest radiograph negative for vascular congestion and BNP normal. Although the patient takes torsemide 27m at home, she demonstrated confusion about proper dosing and had only been taking one dose daily rather than two as prescribed. Heart failure likely contributing to lower extremity swelling. Intravenous furosemide precipitated net fluid loss 16.6L since admission. Echocardiogram consistent with prior study plus pericardial effusion and elevated PA-IVC pressures. Diuresis held on 2-22 for AKI, plan to resume torsemide at home dose tomorrow 2-23. > Spironolactone 264mq24 > Dapagliflozin 30m78m24 unless GFR<30 > Torsemide 21m49m2 starting 2-23 > Monitor ins-outs > Trend BMP q24   ---Mechanical fall ---Physical deconditioning [facility] without loss of consciousness or prodromal symptoms. Upon arrival, imaging was negative for acute intracranial process and thoracic-lumbar fracture, but identified mild-moderate degenerative spondylosis. Vitamin D level within normal limits. Etiology of falls is likely multifactorial including pain, increased lower extremity swelling, and physical deconditioning. Occupational and physical therapy teams are recommending discharge to skilled nursing facility for rehabilitation before returning home. > Transitions of care consult for SNF placement > Acetaminophen 1000mg44m> Oxycodone 30mg q330mRN, moderate-severe pain > Hydromorphone 0.30mg q648mN, breakthrough pain > Fall precautions > Physical-occupational therapy > [facility]   ---Hypertension Patient has [facility] of [facility]. Since arrival, her blood pressure has been stable on home metoprolol dose. > Metoprolol 230mg q263m---Chronic kidney disease IIIb Patient has history of chronic kidney disease with baseline creatinine roughly around 1.1-1.3 per electronic medical record. Upon arrival, her creatinine was 1.31 and potassium 3.8.  Creatinine increased on 2-22, intravenous diuretics held and transition to oral planned for tomorrow 2-23. > Trend BMP q24 > Avoid nephrotoxic agents   ---Anxiety ---Depression Patient has history of anxiety and depression managed at home with citalopram. She also takes clonazepam twice daily as needed. Anxiety is likely a contributing factor to her respiratory distress, counseling was provided on deep-breathing relaxation techniques. > Citalopram 20mg q2450mlonazepam 1mg q12 P51m  ---Diabetes II ---Obesity III Patient has history of morbid obesity and diabetes, her current BMI is 64. Previous A1C collected 01-2022 was 6.9. Managed at home with dapagliflozin. Upon arrival, her A1C was 6.5. Blood glucose has remained in the 100-200 range throughout current hospitalization. > Sliding scale insulin, sensitive sensitivity  > Dapagliflozin 30mg q24   27mIntertrigo Patient has history of morbid obesity and abdominal pannus. Upon arrival, erythematous patch was present in her inguinal region.  > Clotrimazole cream 1% q12   ---Restless legs syndrome Patient has a history of restless legs syndrome managed at home with pramipexole. > Pramipexole 0.30mg q8   --58myroid multinodular mass Patient has history of thyroid nodule. Fine-needle aspiration of left lower thyroid lobe demonstrated benign hyperplastic nodule classified as Bethesda category 2. Most recent TSH level was 2.35 collected 07-2022. > Monitor clinically     Principal Problem:   Fall Active Problems:   Acute  hypoxic respiratory failure (HCC)   Lymphedema   Acute on chronic heart failure with preserved ejection fraction (HFpEF) (HCC)   Chronic right-sided congestive heart  failure ([place])   Pneumonia due to infectious organism   Acute on chronic respiratory failure with hypoxia and hypercapnia ([place])    Prior to Admission Living Arrangement: home alone Anticipated Discharge Location: [facility] Barriers to Discharge: SNF bed availability Dispo: Anticipated discharge in approximately 1 day(s).    [place] Nickel, MD Internal Medicine PGY-1 Pager [phone]  After 5pm on weekdays and 1pm on weekends: On Call pager [phone]

## 2023-02-11 NOTE — Progress Notes (Signed)
No respiratory or ventilatory compromise noted. No NIV machine in the room. Patient is clinically stable.

## 2023-02-11 NOTE — Progress Notes (Signed)
There is no bipap in room at this time and Pt is in no distress at the moment. Pt states she feels better, Sats currently 95% on RA. RT will continue to monitor as needed.

## 2023-02-12 LAB — GLUCOSE, CAPILLARY
Glucose-Capillary: 93 mg/dL (ref 70–99)
Glucose-Capillary: 157 mg/dL — ABNORMAL HIGH (ref 70–99)

## 2023-02-12 LAB — BASIC METABOLIC PANEL
Anion gap: 11 (ref 5–15)
BUN: 24 mg/dL — ABNORMAL HIGH (ref 8–23)
CO2: 29 mmol/L (ref 22–32)
Calcium: 8.3 mg/dL — ABNORMAL LOW (ref 8.9–10.3)
Chloride: 95 mmol/L — ABNORMAL LOW (ref 98–111)
Creatinine, Ser: 1.14 mg/dL — ABNORMAL HIGH (ref 0.44–1.00)
GFR, Estimated: 53 mL/min — ABNORMAL LOW (ref >60)
Glucose, Bld: 102 mg/dL — ABNORMAL HIGH (ref 70–99)
Potassium: 3.5 mmol/L (ref 3.5–5.1)
Sodium: 135 mmol/L (ref 135–145)

## 2023-02-12 MED ORDER — PHENOL 1.4 % MT LIQD: 1.0000 | OROMUCOSAL | Status: DC | PRN

## 2023-02-12 MED ORDER — ARFORMOTEROL TARTRATE 15 MCG/2ML IN NEBU: 15.0000 ug | INHALATION_SOLUTION | Freq: Two times a day (BID) | RESPIRATORY_TRACT | 0 refills | Status: DC

## 2023-02-12 MED ORDER — MENTHOL 3 MG MT LOZG: 1.0000 | LOZENGE | OROMUCOSAL | Status: DC | PRN

## 2023-02-12 MED ORDER — POTASSIUM CHLORIDE 20 MEQ PO PACK
40.0000 meq | PACK | Freq: Two times a day (BID) | ORAL | Status: AC
  Administered 2023-02-12: 40 meq via ORAL
  Filled 2023-02-12: qty 2

## 2023-02-12 MED ORDER — CLOTRIMAZOLE 1 % EX CREA: TOPICAL_CREAM | Freq: Two times a day (BID) | CUTANEOUS | 0 refills | Status: DC

## 2023-02-12 MED ORDER — IPRATROPIUM-ALBUTEROL 0.5-2.5 (3) MG/3ML IN SOLN: 3.0000 mL | Freq: Four times a day (QID) | RESPIRATORY_TRACT | 0 refills | Status: DC

## 2023-02-12 MED ORDER — REVEFENACIN 175 MCG/3ML IN SOLN: 175.0000 ug | Freq: Every day | RESPIRATORY_TRACT | 0 refills | Status: AC

## 2023-02-12 MED ORDER — SENNOSIDES-DOCUSATE SODIUM 8.6-50 MG PO TABS: 1.0000 | ORAL_TABLET | Freq: Every evening | ORAL | 0 refills | Status: DC | PRN

## 2023-02-12 MED ORDER — DM-GUAIFENESIN ER 30-600 MG PO TB12: 1.0000 | ORAL_TABLET | Freq: Two times a day (BID) | ORAL | 0 refills | Status: DC

## 2023-02-12 NOTE — Progress Notes (Signed)
Physical Therapy Treatment Patient Details Name: Maria [name] MRN: [id number] DOB: 1956/03/22 Today's Date: 02/12/2023   History of Present Illness Pt is a 67 y/o female presenting after a fall at home. Negative for acute fx. Admitted for pain control s/p fall, LE edema and LE cellulitis. RR for respiratory distress on 2/17, transitioned to ICU and started treatment for PNA, placed on bipap, as of 2/19 on [facility]NC. PMH: chronic lymphadenitis, HTN, CKD, CHF, GERD, DDD, anxiety, DM, HLD, depression    PT Comments    Pt admitted with above diagnosis. Pt was able to stand x 4 x with RW with min to min guard assist. Pt with fatigue but is starting to tolerate longer periods of standing.  Should be able to progress gait soon.  Plans to go to SNF today.  Pt currently with functional limitations due to balance and endurance deficits. Pt will benefit from skilled PT to increase their independence and safety with mobility to allow discharge to the venue listed below.      Recommendations for follow up therapy are one component of a multi-disciplinary discharge planning process, led by the attending physician.  Recommendations may be updated based on patient status, additional functional criteria and insurance authorization.  Follow Up Recommendations  Skilled nursing-short term rehab (<3 hours/day) Can patient physically be transported by private vehicle: No   Assistance Recommended at Discharge Frequent or constant Supervision/Assistance  Patient can return home with the following Two people to help with walking and/or transfers;A lot of help with bathing/dressing/bathroom;Assistance with cooking/housework;Assist for transportation;Help with stairs or ramp for entrance   Equipment Recommendations  Rolling walker (2 wheels)    Recommendations for Other Services       Precautions / Restrictions Precautions Precautions: Fall;Other (comment) Precaution Comments: monitor O2 Restrictions Weight  Bearing Restrictions: No     Mobility  Bed Mobility               General bed mobility comments: Pt in chair on arrival    Transfers Overall transfer level: Needs assistance   Transfers: Sit to/from Stand Sit to Stand: Min assist, Min guard           General transfer comment: Pt was able to stand to RW x 4 x for up to a minute each time. Pt needing min assist initial stand but progressed to min gaurd assist. Pt could march in place as well. Not ready to take steps but pt is getting stronger each day.    Ambulation/Gait                   Stairs             Wheelchair Mobility    Modified Rankin (Stroke Patients Only)       Balance           Standing balance support: Bilateral upper extremity supported Standing balance-Leahy Scale: Poor Standing balance comment: requires min guard assist and UE support on RW.                            Cognition Arousal/Alertness: Awake/alert Behavior During Therapy: WFL for tasks assessed/performed Overall Cognitive Status: Within Functional Limits for tasks assessed                                          Exercises General  Exercises - Lower Extremity Ankle Circles/Pumps: AROM, Both, 10 reps, Supine Quad Sets: AROM, 10 reps Long Arc Quad: AROM, Both, 10 reps, Seated Hip Flexion/Marching: AROM, Both, 10 reps, Seated    General Comments General comments (skin integrity, edema, etc.): VSS      Pertinent Vitals/Pain Pain Assessment Pain Assessment: Faces Faces Pain Scale: Hurts little more Pain Location: bilat LEs with heel slides Pain Descriptors / Indicators: Grimacing, Guarding, Sore Pain Intervention(s): Limited activity within patient's tolerance, Monitored during session, Repositioned    Home Living                          Prior Function            PT Goals (current goals can now be found in the care plan section) Acute Rehab PT Goals Patient  Stated Goal: to get pain reduced and walk better, better balance Progress towards PT goals: Progressing toward goals    Frequency    Min 3X/week      PT Plan Current plan remains appropriate    Co-evaluation              AM-PAC PT "6 Clicks" Mobility   Outcome Measure  Help needed turning from your back to your side while in a flat bed without using bedrails?: A Little Help needed moving from lying on your back to sitting on the side of a flat bed without using bedrails?: A Lot Help needed moving to and from a bed to a chair (including a wheelchair)?: A Lot Help needed standing up from a chair using your arms (e.g., wheelchair or bedside chair)?: A Lot Help needed to walk in hospital room?: Total Help needed climbing 3-5 steps with a railing? : Total 6 Click Score: 11    End of Session Equipment Utilized During Treatment: Gait belt Activity Tolerance: Patient limited by fatigue Patient left: with call bell/phone within reach;in chair Nurse Communication: Mobility status PT Visit Diagnosis: Unsteadiness on feet (R26.81);Muscle weakness (generalized) (M62.81);Repeated falls (R29.6);History of falling (Z91.81);Difficulty in walking, not elsewhere classified (R26.2);Pain Pain - Right/Left:  (Both) Pain - part of body: Knee;Ankle and joints of foot;Leg     Time: [id number] PT Time Calculation (min) (ACUTE ONLY): 18 min  Charges:  $Therapeutic Activity: 8-22 mins                     [facility] M,PT Acute Rehab Services [phone]    Alvira Philips 02/12/2023, 2:53 PM

## 2023-02-12 NOTE — Discharge Summary (Signed)
Name: Maria [name] MRN: [id number] DOB: July 23, 1956 67 y.o. PCP: Carson Myrtle, NP  Date of Admission: 02/02/2023  6:52 AM Date of Discharge: 02/12/2023 Attending Physician: Aldine Contes, MD  Discharge Diagnosis: Principal Problem:   Fall Active Problems:   Acute hypoxic respiratory failure ([place])   Lymphedema   Acute on chronic heart failure with preserved ejection fraction (HFpEF) (H[facility])   Chronic right-sided congestive heart failure ([place])   Pneumonia due to infectious organism   Acute on chronic respiratory failure with hypoxia and hypercapnia ([place])     Discharge Medications: Allergies as of 02/12/2023       Reactions   Amoxicillin-pot Clavulanate Nausea And Vomiting   Projectile vomiting Did it involve swelling of the face/tongue/throat, SOB, or low BP? No Did it involve sudden or severe rash/hives, skin peeling, or any reaction on the inside of your mouth or nose? No Did you need to seek medical attention at a hospital or doctor's office? No When did it last happen?      20-30 years If all above answers are "NO", may proceed with cephalosporin use.   Adhesive [tape] Other (See Comments)   Tears skin off - use paper tape    Codeine Other (See Comments)   hallucinations   Crestor [rosuvastatin Calcium]    Severe muscle cramps   Lyrica [pregabalin] Other (See Comments)   weakness   Morphine And Related Nausea And Vomiting, Other (See Comments)   Migraine headaches   Vicodin [hydrocodone-acetaminophen] Other (See Comments)   hallucinations   Latex Rash        Medication List     STOP taking these medications    cefdinir 300 MG capsule Commonly known as: OMNICEF   cyclobenzaprine 10 MG tablet Commonly known as: FLEXERIL       TAKE these medications    Accu-Chek Guide test strip Generic drug: glucose blood Use as instructed up to 4 times daily   Accu-Chek Guide w/Device Kit Use as directed What changed:  how much to take how to take  this when to take this additional instructions   albuterol 108 (90 Base) MCG/ACT inhaler Commonly known as: ProAir HFA Inhale 2 puffs into the lungs every 6 (six) hours as needed for wheezing or shortness of breath.   arformoterol 15 MCG/2ML Nebu Commonly known as: BROVANA Take 2 mLs (15 mcg total) by nebulization 2 (two) times daily.   cholecalciferol 25 MCG (1000 UNIT) tablet Commonly known as: VITAMIN D3 Take 2,000 Units by mouth daily.   citalopram 20 MG tablet Commonly known as: CELEXA Take 1 tablet (20 mg total) by mouth daily.   clonazePAM 1 MG tablet Commonly known as: KLONOPIN Take 1 tablet (1 mg total) by mouth 2 (two) times daily as needed for anxiety. for anxiety   clotrimazole 1 % cream Commonly known as: LOTRIMIN Apply topically 2 (two) times daily.   dextromethorphan-guaiFENesin 30-600 MG 12hr tablet Commonly known as: MUCINEX DM Take 1 tablet by mouth 2 (two) times daily.   Farxiga 5 MG Tabs tablet Generic drug: dapagliflozin propanediol Take 5 mg by mouth daily.   fluticasone 50 MCG/ACT nasal spray Commonly known as: FLONASE Place 1 spray into both nostrils daily.   ibuprofen 800 MG tablet Commonly known as: ADVIL Take 800 mg by mouth every 8 (eight) hours as needed for mild pain.   ipratropium-albuterol 0.5-2.5 (3) MG/3ML Soln Commonly known as: DUONEB Inhale 3 mLs into the lungs every 6 (six) hours for 5 days.  Metamucil Fiber Chew Chew 3 tablets by mouth at bedtime.   metolazone 5 MG tablet Commonly known as: ZAROXOLYN Take 5 mg by mouth 2 (two) times a week. Per nephrologist on Wed and Sunday   metoprolol tartrate 25 MG tablet Commonly known as: LOPRESSOR Take 25 mg by mouth in the morning.   Narcan 4 MG/0.1ML Liqd nasal spray kit Generic drug: naloxone Place 0.4 mg into the nose daily as needed (opioid overdose).   ondansetron 4 MG disintegrating tablet Commonly known as: Zofran ODT Take 1 tablet (4 mg total) by mouth every 4  (four) hours as needed for nausea or vomiting.   Oxycodone HCl 10 MG Tabs Take 1 tablet (10 mg total) 4 (four) times daily by mouth. Per [facility] What changed:  when to take this reasons to take this   pantoprazole 40 MG tablet Commonly known as: PROTONIX TAKE ONE TABLET BY MOUTH DAILY   Polyethyl Glycol-Propyl Glycol 0.4-0.3 % Soln Place 1 drop into both eyes daily as needed (dry eyes).   potassium chloride SA 20 MEQ tablet Commonly known as: KLOR-CON M Take 4 tablets (80 mEq total) by mouth 2 (two) times daily. What changed: how much to take   pramipexole 0.5 MG tablet Commonly known as: MIRAPEX Take 1 tablet (0.5 mg total) by mouth 3 (three) times daily.   revefenacin 175 MCG/3ML nebulizer solution Commonly known as: YUPELRI Take 3 mLs (175 mcg total) by nebulization daily. Start taking on: February 13, 2023   senna-docusate 8.6-50 MG tablet Commonly known as: Senokot-S Take 1 tablet by mouth at bedtime as needed for mild constipation.   spironolactone 25 MG tablet Commonly known as: ALDACTONE Take 0.5 tablets (12.5 mg total) by mouth daily. What changed: how much to take   torsemide 20 MG tablet Commonly known as: DEMADEX Take 3 tablets (60 mg total) by mouth 2 (two) times daily.               Discharge Care Instructions  (From admission, onward)           Start     Ordered   02/12/23 0000  Discharge wound care:       Comments: Wash LEs with soap and water, dry. Dry between digits.  Apply Eucerin cream to LEs and feet but do not apply between toes.  Cover areas of weeping dermatitis with folded xeroform gauze Kellie Simmering # 294)Wrap LEs from just below toes to just below knees with Kerlix roll gauze, secure with paper tape. Top Kerlix with 6-inch ACE bandages applied in a similar manner (from just below toes to just below knees). Place feet into [employer]. Transition to Unna boots when cellulitis resolves.   02/12/23 1041              Disposition and follow-up:    Maria.Maria [name] was discharged from [facility] in Stable condition.  At the hospital follow up visit please address:   1.  Acute hypoxic respiratory failure: ensure breath shortness and wheezing have resolved without recurrence, confirm that patient is able to perform daily activities independently, verify adherence to revefenacin and arformoterol, prescribed five-day course of ipratropium-albuterol  2.  Lower extremity lymphadenitis: check for lower extremity swelling, ensure cellulitis has completely resolved, encourage compression socks and frequent elevation plus ambulation  3.  Heart failure exacerbation: evaluate volume status including lower extremity swelling and bibasilar crackles, confirm adherence to torsemide '60mg'$  q12 and spironolactone '25mg'$  q24  4. Labs /  imaging needed at time of follow-up: BMP for K and Cr, consider PFTs to evaluate for COPD  5.  Pending labs / tests needing follow-up: none    Follow-up Appointments:  Please schedule a follow-up visit with your primary care provider   Hospital Course by problem list:  Maria [name] is a 67 year old female with a past medical history of morbid obesity, hypertension, hyperlipidemia, chronic diastolic heart failure, chronic lymphadenitis, CKD IIIb, and DDD who was delivered to the ED after a mechanical fall at home; admitted for management of lower extremity cellulitis and chronic lymphadenitis; hospital course complicated by pneumonia requiring two days of ICU-level care.       ---Acute hypoxic and hypercapnic respiratory failure ---Left lower lobe pneumonia Upon arrival, chest radiograph notable only for enlarged cardiac silhouette without evidence of consolidation. Overnight on 2-14 patient developed acute worsening of breath shortness and exam revealed mild expiratory wheezing. Chest radiograph demonstrated new bilateral interstitial and alveolar airspace opacities  concerning for multilobar pneumonia. Cellulitis antibiotic regimen expanded from cefazolin to ceftriaxone plus azithromycin for pneumonia coverage, completed full course on 2-20. Oxygen requirements increased to 12L/min and then escalated to BiPAP requiring ICU-level care for two days. She returned to the general medicine floor on 2-20 and oxygen requirements have been weaned since arrival. On day of discharge, SpO2 values consistently >95% without supplemental oxygen. Discharged with five days of ipratropium-albuterol given mild residual wheezing plus arformoterol and revefenacin for maintenance. Recommend PFT as outpatient and close follow-up with primary care provider. Concern for undiagnosed COPD.     ---Lower extremity cellulitis ---Chronic lymphadenitis Patient has a history of lymphadenitis and morbid obesity. During prior hospitalization, her legs were placed in an Unna boot and she was discharged with home health. She presented on 2-13 with lower extremity swelling and erythema, which has worsened over the last few weeks. Exam upon arrival notable for significant lymphedema complicated by bilateral lower extremity cellulitis. Vitals including temperature were unremarkable and laboratory findings without leukocytosis. Lymphedema likely has component of venous insufficiency secondary to morbid obesity and physical inactivity, heart failure probably contributing as well. Cefazolin started and then switched to ceftriaxone plus azithromycin on 2-16 for additional pneumonia coverage, course completed on 2-20. Area of involvement and swelling both improved considerably since antibiotic treatment. Discharged with recommendations to compress, elevate, and ambulate to minimize swelling.     ---Acute on chronic diastolic heart failure exacerbation Patient has history of heart failure, prior echocardiogram performed 09-2021 demonstrated ejection fraction 65-70% and grade II diastolic dysfunction. She reports  chronic dyspnea on exertion without orthopnea or breath shortness at rest. Upon arrival, chest radiograph negative for vascular congestion and BNP normal. Although the patient takes torsemide '60mg'$  at home, she demonstrated confusion about proper dosing and had only been taking one dose daily rather than two as prescribed. Heart failure likely contributed to her lower extremity swelling. Intravenous furosemide precipitated net fluid loss 15.4L during hospitalization. Echocardiogram on 2-20 consistent with prior study plus pericardial effusion and elevated PA-IVC pressures. Discharged with home torsemide, spironolactone, and dapagliflozin doses.     ---Mechanical fall ---Physical deconditioning Patient was delivered to [facility] ED after experiencing a mechanical fall without loss of consciousness or prodromal symptoms. Upon arrival, imaging was negative for acute intracranial process and thoracic-lumbar fracture, but identified mild-moderate degenerative spondylosis. Vitamin D level within normal limits. Etiology of falls is likely multifactorial including pain, increased lower extremity swelling, and physical deconditioning. Occupational and physical therapy teams both recommending discharge to skilled nursing facility  for rehabilitation before returning home. Discharged to [facility] to improve strength and endurance before returning home.     ---Hypertension Patient has history of hypertension managed at [facility] with metoprolol daily. Throughout hospitalization, her blood pressure has been stable on home metoprolol dose.     ---Chronic kidney disease IIIb Patient has history of chronic kidney disease with baseline creatinine roughly around 1.1-1.3 per electronic medical record. Upon arrival, her creatinine was 1.31 and potassium 3.8. Creatinine remained stable throughout hospitalization.     ---Anxiety ---Depression Patient has history of anxiety and depression managed at home with citalopram. She also  takes clonazepam twice daily as needed. Anxiety likely a contributing factor to her respiratory distress, counseling was provided on deep-breathing relaxation techniques. Discharged with home citalopram and clonazepam.     ---Diabetes II ---Obesity III Patient has a history of morbid obesity and diabetes, BMI is 64. Previous A1C collected 01-2022 was 6.9. Managed at home with dapagliflozin. Upon arrival, her A1C was 6.5. Blood glucose remained in the 100-200 range throughout hospitalization on sliding scale insulin. Discharged with home dapagliflozin.     ---Intertrigo Patient has history of morbid obesity and abdominal pannus. Upon arrival, erythematous patch was present in her inguinal region. She was prescribed and discharged with clotrimazole cream.     ---Restless legs syndrome Patient has a history of restless legs syndrome managed at home with pramipexole, which was continued throughout hospitalization.      ---Thyroid multinodular mass Patient has history of thyroid nodule. Fine-needle aspiration of left lower thyroid lobe demonstrated benign hyperplastic nodule classified as [place] category 2. Most recent TSH level was 2.35 collected 07-2022.        Discharge Exam:    BP (!) 129/48 (BP Location: Left Arm)   Pulse 63   Temp 98.3 F (36.8 C) (Oral)   Resp 14   Ht '5\' 3"'$  (1.6 m)   Wt (!) 150 kg   SpO2 92%   BMI 58.58 kg/m   Subjective: Patient reports feeling okay this morning, expressed some concern about ongoing chest tightness. Provided reassurance and explained that fully recovering may take time. Discussed plan to discharge when bed becomes available. Emphasized importance of close follow-up with primary care providers.  General:                       awake and alert, lying in bed, cooperative, not in acute distress Skin:                             warm and dry, bilateral lower extremities wrapped in compressive dressings from toes to below knees Lungs:                           normal respiratory effort, breathing slightly labored, symmetrical chest rise, mild expiratory wheezing, no crackles Cardiac:                        regular rate and rhythm, normal S1 and S2, no pitting edema Abdomen:                     soft and non-distended, normoactive bowel sounds, no tenderness to palpation or guarding Neurologic:                   oriented to person-place-time, moving all extremities, no gross focal deficits Psychiatric:  anxious mood with congruent affect, intelligible speech      Pertinent Labs, Studies, and Procedures:   Labs:    Latest Ref Rng & Units 02/10/2023   12:43 AM 02/09/2023    1:11 AM 02/08/2023    1:06 AM  CBC  WBC 4.0 - 10.5 K/uL 7.2  9.4  7.0   Hemoglobin 12.0 - 15.0 g/dL 11.7  11.1  11.2   Hematocrit 36.0 - 46.0 % 36.7  34.3  35.6   Platelets 150 - 400 K/uL 280  271  252       Latest Ref Rng & Units 02/12/2023   12:56 AM 02/11/2023    1:06 AM 02/10/2023   12:43 AM  CMP  Glucose 70 - 99 mg/dL 102  105  109   BUN 8 - 23 mg/dL '24  23  28   '$ Creatinine 0.44 - 1.00 mg/dL 1.14  1.15  0.98   Sodium 135 - 145 mmol/L 135  140  139   Potassium 3.5 - 5.1 mmol/L 3.5  3.9  3.7   Chloride 98 - 111 mmol/L 95  97  97   CO2 22 - 32 mmol/L 29  31  32   Calcium 8.9 - 10.3 mg/dL 8.3  8.6  8.5     ______________________  Imaging:  DG Lumbar Spine Complete Result Date: 02/02/2023 IMPRESSION: 1. No acute findings. No osseous fracture or subluxation. 2. Degenerative spondylosis of the lumbar spine, mild to moderate in degree, as detailed above. 3. Aortic atherosclerosis.   DG Thoracic Spine 2 View Result Date: 02/02/2023 IMPRESSION: 1. No acute findings. No osseous fracture or subluxation. 2. Degenerative spondylosis throughout the thoracic spine, mild to moderate in degree. 3. Mild scoliosis and kyphosis.   DG Chest 1 View Result Date: 02/02/2023 IMPRESSION: Enlarged cardiac silhouette.   CT Head Wo Contrast Result  Date: 02/02/2023 IMPRESSION: Stable and negative head CT.    ______________________  Procedures:  Echocardiogram on 2-20  ______________________   Discharge Instructions:  Discharge Instructions     Diet - low sodium heart healthy   Complete by: As directed    Discharge instructions   Complete by: As directed    ?Maria [name],  It was a pleasure taking care of you while you were in the hospital. Your frequent falls were caused by several factors including weakness, infection, leg swelling, and heart failure. We gave you diuretics to reduce swelling in your legs caused by heart failure and you lost a total of 13L fluids.  While you were here, we discovered an infection in your legs that was contributing to your swelling. We treated this infection with antibiotics and the infection resolved. Unfortunately, you also developed pneumonia and needed to stay in the intensive care unit for two days. We gave you different antibiotics for this infection and your breathing improved.  We are now sending you to a skilled nursing facility for rehabilitation with the goal of improving your strength and endurance. Working hard in rehab will allow you to return home and live independently. Good luck, we wish you the best!   Discharge wound care:   Complete by: As directed    Wash LEs with soap and water, dry. Dry between digits.  Apply Eucerin cream to LEs and feet but do not apply between toes.  Cover areas of weeping dermatitis with folded xeroform gauze Kellie Simmering # 294)Wrap LEs from just below toes to just below knees with Kerlix roll gauze, secure with paper tape. Top Kerlix  with 6-inch ACE bandages applied in a similar manner (from just below toes to just below knees). Place feet into [employer]. Transition to Unna boots when cellulitis resolves.   Increase activity slowly   Complete by: As directed    Increase activity slowly   Complete by: As directed    No wound care   Complete by: As  directed      Maria [name],  It was a pleasure taking care of you while you were in the hospital. Your frequent falls were caused by several factors including weakness, infection, leg swelling, and heart failure. We gave you diuretics to reduce swelling in your legs caused by heart failure and you lost a total of 13L fluids.  While you were here, we discovered an infection in your legs that was contributing to your swelling. We treated this infection with antibiotics and the infection resolved. Unfortunately, you also developed pneumonia and needed to stay in the intensive care unit for two days. We gave you different antibiotics for this infection and your breathing improved.  We are now sending you to a skilled nursing facility for rehabilitation with the goal of improving your strength and endurance. Working hard in rehab will allow you to return home and live independently. Good luck, we wish you the best!   Signed: Roswell Nickel, MD Internal Medicine PGY-1 Pager [phone]

## 2023-02-12 NOTE — Plan of Care (Signed)
  Problem: Clinical Measurements: Goal: Ability to avoid or minimize complications of infection will improve Outcome: Progressing   Problem: Skin Integrity: Goal: Skin integrity will improve Outcome: Progressing   Problem: Education: Goal: Knowledge of General Education information will improve Description: Including pain rating scale, medication(s)/side effects and non-pharmacologic comfort measures Outcome: Progressing   Problem: Health Behavior/Discharge Planning: Goal: Ability to manage health-related needs will improve Outcome: Progressing   Problem: Clinical Measurements: Goal: Ability to maintain clinical measurements within normal limits will improve Outcome: Progressing Goal: Will remain free from infection Outcome: Progressing Goal: Diagnostic test results will improve Outcome: Progressing Goal: Respiratory complications will improve Outcome: Progressing Goal: Cardiovascular complication will be avoided Outcome: Progressing   Problem: Activity: Goal: Risk for activity intolerance will decrease Outcome: Progressing   Problem: Nutrition: Goal: Adequate nutrition will be maintained Outcome: Progressing   Problem: Coping: Goal: Level of anxiety will decrease Outcome: Progressing   Problem: Elimination: Goal: Will not experience complications related to bowel motility Outcome: Progressing Goal: Will not experience complications related to urinary retention Outcome: Progressing   Problem: Pain Managment: Goal: General experience of comfort will improve Outcome: Progressing   Problem: Safety: Goal: Ability to remain free from injury will improve Outcome: Progressing   Problem: Skin Integrity: Goal: Risk for impaired skin integrity will decrease Outcome: Progressing   Problem: Education: Goal: Ability to describe self-care measures that may prevent or decrease complications (Diabetes Survival Skills Education) will improve Outcome: Progressing Goal:  Individualized Educational Video(s) Outcome: Progressing   Problem: Coping: Goal: Ability to adjust to condition or change in health will improve Outcome: Progressing   Problem: Fluid Volume: Goal: Ability to maintain a balanced intake and output will improve Outcome: Progressing   Problem: Health Behavior/Discharge Planning: Goal: Ability to identify and utilize available resources and services will improve Outcome: Progressing Goal: Ability to manage health-related needs will improve Outcome: Progressing   Problem: Metabolic: Goal: Ability to maintain appropriate glucose levels will improve Outcome: Progressing   Problem: Nutritional: Goal: Maintenance of adequate nutrition will improve Outcome: Progressing Goal: Progress toward achieving an optimal weight will improve Outcome: Progressing   Problem: Skin Integrity: Goal: Risk for impaired skin integrity will decrease Outcome: Progressing   Problem: Tissue Perfusion: Goal: Adequacy of tissue perfusion will improve Outcome: Progressing

## 2023-02-12 NOTE — Care Management Important Message (Signed)
Important Message  Patient Details  Name: Maria [name] MRN: [id number] Date of Birth: Jul 01, 1956   Medicare Important Message Given:  Yes     Shelda Altes 02/12/2023, 11:14 AM

## 2023-02-12 NOTE — TOC Transition Note (Signed)
Transition of Care [facility]) - CM/SW Discharge Note   Patient Details  Name: Maria [name] MRN: [id number] Date of Birth: 11/15/1956  [facility]) CM/SW Contact:  Bjorn Pippin, LCSW Phone Number: 02/12/2023, 12:04 PM   Clinical Narrative:    Patient will DC to: [facility]  Anticipated DC date: 02/12/2023 Family notified: VM left with daughter Field seismologist by: Corey Harold   Per MD patient ready for DC to [facility]. RN, patient, patient's family, and facility notified of DC. Discharge Summary and FL2 sent to facility. RN to call report prior to discharge [phone]. DC packet on chart. Ambulance transport requested for patient.   CSW will sign off for now as social work intervention is no longer needed. Please consult [place] again if new needs arise.   Final next level of care: Skilled Nursing Facility Barriers to Discharge: No Barriers Identified   Patient Goals and CMS Choice CMS Medicare.gov Compare Post Acute Care list provided to:: Patient Choice offered to / list presented to : Patient  Discharge Placement                Patient [facility] Patient to be transferred to facility by: [facility] Name of family member notified: Daughter [name] Patient and family notified of of transfer: 02/12/23  Discharge Plan and Services Additional resources added to the After Visit Summary for                                       Social Determinants of Health (SDOH) Interventions SDOH Screenings   Food Insecurity: Food Insecurity Present (02/02/2023)  Housing: Low Risk  (02/02/2023)  Transportation Needs: Unmet Transportation Needs (02/02/2023)  Utilities: Not At Risk (02/02/2023)  Alcohol Screen: Low Risk  (12/30/2021)  Depression (PHQ2-9): Medium Risk (01/30/2022)  Financial Resource Strain: Medium Risk (12/30/2021)  Physical Activity: Inactive (12/30/2021)  Social Connections: Socially Isolated (12/30/2021)  Stress: No Stress Concern  Present (12/30/2021)  Tobacco Use: Medium Risk (02/02/2023)     Readmission Risk Interventions    07/22/2022    2:59 PM 04/11/2021   11:40 AM  Readmission Risk Prevention Plan  Transportation Screening Complete Complete  PCP or Specialist Appt within 3-5 Days Complete Complete  HRI or [place] Complete Complete  Social Work Consult for [place] Planning/Counseling Complete Complete  Palliative Care Screening Complete Not Applicable  Medication Review Press photographer) Complete Complete    Beckey Rutter, MSW, LCSWA, LCASA Transitions of Care  Clinical Social Worker I

## 2023-02-15 ENCOUNTER — Other Ambulatory Visit: Payer: Self-pay | Admitting: Internal Medicine

## 2023-02-24 ENCOUNTER — Ambulatory Visit: Payer: 59 | Admitting: Physician Assistant

## 2023-06-05 ENCOUNTER — Other Ambulatory Visit: Payer: Self-pay | Admitting: Internal Medicine

## 2023-06-07 NOTE — Telephone Encounter (Signed)
Ok to forward to new pcp please

## 2023-07-15 ENCOUNTER — Other Ambulatory Visit: Payer: Self-pay

## 2023-07-15 ENCOUNTER — Observation Stay (HOSPITAL_COMMUNITY): Payer: 59

## 2023-07-15 ENCOUNTER — Emergency Department (HOSPITAL_COMMUNITY): Payer: 59

## 2023-07-15 ENCOUNTER — Encounter (HOSPITAL_COMMUNITY): Payer: Self-pay

## 2023-07-15 ENCOUNTER — Observation Stay (HOSPITAL_COMMUNITY)
Admission: EM | Admit: 2023-07-15 | Discharge: 2023-07-17 | Disposition: A | Discharge status: Home Health | Payer: 59 | Attending: Family Medicine | Admitting: Family Medicine

## 2023-07-15 DIAGNOSIS — R2243 Localized swelling, mass and lump, lower limb, bilateral: Secondary | ICD-10-CM | POA: Diagnosis present

## 2023-07-15 DIAGNOSIS — N179 Acute kidney failure, unspecified: Secondary | ICD-10-CM | POA: Diagnosis present

## 2023-07-15 DIAGNOSIS — I110 Hypertensive heart disease with heart failure: Secondary | ICD-10-CM | POA: Insufficient documentation

## 2023-07-15 DIAGNOSIS — D519 Vitamin B12 deficiency anemia, unspecified: Secondary | ICD-10-CM | POA: Diagnosis not present

## 2023-07-15 DIAGNOSIS — E876 Hypokalemia: Secondary | ICD-10-CM | POA: Diagnosis present

## 2023-07-15 DIAGNOSIS — Z87891 Personal history of nicotine dependence: Secondary | ICD-10-CM | POA: Insufficient documentation

## 2023-07-15 DIAGNOSIS — E538 Deficiency of other specified B group vitamins: Secondary | ICD-10-CM | POA: Diagnosis present

## 2023-07-15 DIAGNOSIS — R531 Weakness: Secondary | ICD-10-CM | POA: Diagnosis not present

## 2023-07-15 DIAGNOSIS — R1030 Lower abdominal pain, unspecified: Secondary | ICD-10-CM | POA: Insufficient documentation

## 2023-07-15 DIAGNOSIS — E1140 Type 2 diabetes mellitus with diabetic neuropathy, unspecified: Secondary | ICD-10-CM | POA: Diagnosis not present

## 2023-07-15 DIAGNOSIS — I5030 Unspecified diastolic (congestive) heart failure: Secondary | ICD-10-CM | POA: Diagnosis present

## 2023-07-15 DIAGNOSIS — J449 Chronic obstructive pulmonary disease, unspecified: Secondary | ICD-10-CM | POA: Insufficient documentation

## 2023-07-15 DIAGNOSIS — N1831 Chronic kidney disease, stage 3a: Secondary | ICD-10-CM | POA: Diagnosis present

## 2023-07-15 DIAGNOSIS — Z8673 Personal history of transient ischemic attack (TIA), and cerebral infarction without residual deficits: Secondary | ICD-10-CM | POA: Insufficient documentation

## 2023-07-15 DIAGNOSIS — R109 Unspecified abdominal pain: Secondary | ICD-10-CM | POA: Diagnosis present

## 2023-07-15 LAB — COMPREHENSIVE METABOLIC PANEL
ALT: 14 U/L (ref 0–44)
AST: 21 U/L (ref 15–41)
Albumin: 3.5 g/dL (ref 3.5–5.0)
Alkaline Phosphatase: 66 U/L (ref 38–126)
Anion gap: 12 (ref 5–15)
BUN: 21 mg/dL (ref 8–23)
CO2: 34 mmol/L — ABNORMAL HIGH (ref 22–32)
Calcium: 8.6 mg/dL — ABNORMAL LOW (ref 8.9–10.3)
Chloride: 84 mmol/L — ABNORMAL LOW (ref 98–111)
Creatinine, Ser: 1.68 mg/dL — ABNORMAL HIGH (ref 0.44–1.00)
GFR, Estimated: 33 mL/min — ABNORMAL LOW (ref >60)
Glucose, Bld: 117 mg/dL — ABNORMAL HIGH (ref 70–99)
Potassium: 2.5 mmol/L — CL (ref 3.5–5.1)
Sodium: 130 mmol/L — ABNORMAL LOW (ref 135–145)
Total Bilirubin: 1.9 mg/dL — ABNORMAL HIGH (ref 0.3–1.2)
Total Protein: 7.2 g/dL (ref 6.5–8.1)

## 2023-07-15 LAB — LIPASE, BLOOD: Lipase: 35 U/L (ref 11–51)

## 2023-07-15 LAB — CBC WITH DIFFERENTIAL/PLATELET
Abs Immature Granulocytes: 0.04 10*3/uL (ref 0.00–0.07)
Basophils Absolute: 0.1 10*3/uL (ref 0.0–0.1)
Basophils Relative: 1 %
Eosinophils Absolute: 0.1 10*3/uL (ref 0.0–0.5)
Eosinophils Relative: 1 %
HCT: 36.6 % (ref 36.0–46.0)
Hemoglobin: 12.1 g/dL (ref 12.0–15.0)
Immature Granulocytes: 0 %
Lymphocytes Relative: 22 %
Lymphs Abs: 2.2 10*3/uL (ref 0.7–4.0)
MCH: 29.2 pg (ref 26.0–34.0)
MCHC: 33.1 g/dL (ref 30.0–36.0)
MCV: 88.2 fL (ref 80.0–100.0)
Monocytes Absolute: 0.7 10*3/uL (ref 0.1–1.0)
Monocytes Relative: 7 %
Neutro Abs: 7.1 10*3/uL (ref 1.7–7.7)
Neutrophils Relative %: 69 %
Platelets: 257 10*3/uL (ref 150–400)
RBC: 4.15 MIL/uL (ref 3.87–5.11)
RDW: 13.7 % (ref 11.5–15.5)
WBC: 10.3 10*3/uL (ref 4.0–10.5)
nRBC: 0 % (ref 0.0–0.2)

## 2023-07-15 LAB — CK: Total CK: 33 U/L — ABNORMAL LOW (ref 38–234)

## 2023-07-15 LAB — SEDIMENTATION RATE: Sed Rate: 18 mm/hr (ref 0–22)

## 2023-07-15 LAB — GLUCOSE, CAPILLARY
Glucose-Capillary: 89 mg/dL (ref 70–99)
Glucose-Capillary: 153 mg/dL — ABNORMAL HIGH (ref 70–99)

## 2023-07-15 LAB — BASIC METABOLIC PANEL
Anion gap: 11 (ref 5–15)
BUN: 20 mg/dL (ref 8–23)
CO2: 35 mmol/L — ABNORMAL HIGH (ref 22–32)
Calcium: 8.4 mg/dL — ABNORMAL LOW (ref 8.9–10.3)
Chloride: 86 mmol/L — ABNORMAL LOW (ref 98–111)
Creatinine, Ser: 1.47 mg/dL — ABNORMAL HIGH (ref 0.44–1.00)
GFR, Estimated: 39 mL/min — ABNORMAL LOW (ref >60)
Glucose, Bld: 107 mg/dL — ABNORMAL HIGH (ref 70–99)
Potassium: 2.4 mmol/L — CL (ref 3.5–5.1)
Sodium: 132 mmol/L — ABNORMAL LOW (ref 135–145)

## 2023-07-15 LAB — HEMOGLOBIN A1C
Hgb A1c MFr Bld: 6 % — ABNORMAL HIGH (ref 4.8–5.6)
Mean Plasma Glucose: 126 mg/dL

## 2023-07-15 LAB — C-REACTIVE PROTEIN: CRP: 1 mg/dL — ABNORMAL HIGH (ref <1.0)

## 2023-07-15 LAB — MAGNESIUM: Magnesium: 1.8 mg/dL (ref 1.7–2.4)

## 2023-07-15 LAB — VITAMIN B12: Vitamin B-12: 158 pg/mL — ABNORMAL LOW (ref 180–914)

## 2023-07-15 LAB — TSH: TSH: 1.62 u[IU]/mL (ref 0.350–4.500)

## 2023-07-15 MED ORDER — POTASSIUM CHLORIDE 10 MEQ/100ML IV SOLN
10.0000 meq | INTRAVENOUS | Status: AC
  Administered 2023-07-15 – 2023-07-16 (×3): 10 meq via INTRAVENOUS
  Filled 2023-07-15 (×3): qty 100

## 2023-07-15 MED ORDER — ALBUTEROL SULFATE (2.5 MG/3ML) 0.083% IN NEBU: 2.5000 mg | INHALATION_SOLUTION | Freq: Four times a day (QID) | RESPIRATORY_TRACT | Status: DC | PRN

## 2023-07-15 MED ORDER — POTASSIUM CHLORIDE 10 MEQ/100ML IV SOLN
10.0000 meq | INTRAVENOUS | Status: DC
  Administered 2023-07-15: 10 meq via INTRAVENOUS
  Filled 2023-07-15: qty 100

## 2023-07-15 MED ORDER — ARFORMOTEROL TARTRATE 15 MCG/2ML IN NEBU
15.0000 ug | INHALATION_SOLUTION | Freq: Two times a day (BID) | RESPIRATORY_TRACT | Status: DC
  Administered 2023-07-15 – 2023-07-17 (×4): 15 ug via RESPIRATORY_TRACT
  Filled 2023-07-15 (×4): qty 2

## 2023-07-15 MED ORDER — SPIRONOLACTONE 12.5 MG HALF TABLET
25.0000 mg | ORAL_TABLET | Freq: Every day | ORAL | Status: DC
  Administered 2023-07-16 – 2023-07-17 (×2): 25 mg via ORAL
  Filled 2023-07-15 (×2): qty 2

## 2023-07-15 MED ORDER — POTASSIUM CHLORIDE CRYS ER 20 MEQ PO TBCR
60.0000 meq | EXTENDED_RELEASE_TABLET | Freq: Once | ORAL | Status: AC
  Administered 2023-07-15: 60 meq via ORAL
  Filled 2023-07-15: qty 3

## 2023-07-15 MED ORDER — ENOXAPARIN SODIUM 30 MG/0.3ML IJ SOSY: 30.0000 mg | PREFILLED_SYRINGE | INTRAMUSCULAR | Status: DC

## 2023-07-15 MED ORDER — ENOXAPARIN SODIUM 80 MG/0.8ML IJ SOSY
70.0000 mg | PREFILLED_SYRINGE | INTRAMUSCULAR | Status: DC
  Administered 2023-07-16: 70 mg via SUBCUTANEOUS
  Filled 2023-07-15: qty 0.8

## 2023-07-15 MED ORDER — METOPROLOL TARTRATE 25 MG PO TABS
25.0000 mg | ORAL_TABLET | Freq: Every morning | ORAL | Status: DC
  Administered 2023-07-16: 25 mg via ORAL
  Filled 2023-07-15: qty 1

## 2023-07-15 MED ORDER — PANTOPRAZOLE SODIUM 40 MG PO TBEC
40.0000 mg | DELAYED_RELEASE_TABLET | Freq: Every day | ORAL | Status: DC
  Administered 2023-07-16 – 2023-07-17 (×2): 40 mg via ORAL
  Filled 2023-07-15 (×2): qty 1

## 2023-07-15 MED ORDER — POTASSIUM CHLORIDE 10 MEQ/100ML IV SOLN
10.0000 meq | INTRAVENOUS | Status: AC
  Administered 2023-07-15 (×3): 10 meq via INTRAVENOUS
  Filled 2023-07-15 (×3): qty 100

## 2023-07-15 MED ORDER — OXYCODONE HCL 5 MG PO TABS
10.0000 mg | ORAL_TABLET | ORAL | Status: DC | PRN
  Administered 2023-07-16 – 2023-07-17 (×7): 10 mg via ORAL
  Filled 2023-07-15 (×7): qty 2

## 2023-07-15 MED ORDER — SCOPOLAMINE 1 MG/3DAYS TD PT72
1.0000 | MEDICATED_PATCH | TRANSDERMAL | Status: DC
  Filled 2023-07-15: qty 1

## 2023-07-15 MED ORDER — CITALOPRAM HYDROBROMIDE 20 MG PO TABS
20.0000 mg | ORAL_TABLET | Freq: Every day | ORAL | Status: DC
  Administered 2023-07-16 – 2023-07-17 (×2): 20 mg via ORAL
  Filled 2023-07-15 (×2): qty 2
  Filled 2023-07-15: qty 1

## 2023-07-15 MED ORDER — POTASSIUM CHLORIDE CRYS ER 20 MEQ PO TBCR
40.0000 meq | EXTENDED_RELEASE_TABLET | Freq: Once | ORAL | Status: AC
  Administered 2023-07-15: 40 meq via ORAL
  Filled 2023-07-15: qty 2

## 2023-07-15 MED ORDER — CLONAZEPAM 0.5 MG PO TABS
1.0000 mg | ORAL_TABLET | Freq: Two times a day (BID) | ORAL | Status: DC | PRN
  Administered 2023-07-16: 1 mg via ORAL
  Filled 2023-07-15: qty 2

## 2023-07-15 MED ORDER — DAPAGLIFLOZIN PROPANEDIOL 5 MG PO TABS
5.0000 mg | ORAL_TABLET | Freq: Every day | ORAL | Status: DC
  Administered 2023-07-16 – 2023-07-17 (×2): 5 mg via ORAL
  Filled 2023-07-15 (×2): qty 1

## 2023-07-15 MED ORDER — GABAPENTIN 300 MG PO CAPS
300.0000 mg | ORAL_CAPSULE | Freq: Once | ORAL | Status: AC
  Administered 2023-07-15: 300 mg via ORAL
  Filled 2023-07-15: qty 1

## 2023-07-15 MED ORDER — POLYETHYLENE GLYCOL 3350 17 G PO PACK
17.0000 g | PACK | Freq: Two times a day (BID) | ORAL | Status: DC
  Administered 2023-07-15: 17 g via ORAL
  Filled 2023-07-15 (×4): qty 1

## 2023-07-15 MED ORDER — MAGNESIUM SULFATE 2 GM/50ML IV SOLN
2.0000 g | Freq: Once | INTRAVENOUS | Status: AC
  Administered 2023-07-15: 2 g via INTRAVENOUS
  Filled 2023-07-15: qty 50

## 2023-07-15 MED ORDER — SENNOSIDES-DOCUSATE SODIUM 8.6-50 MG PO TABS
1.0000 | ORAL_TABLET | Freq: Two times a day (BID) | ORAL | Status: DC
  Administered 2023-07-15 – 2023-07-17 (×3): 1 via ORAL
  Filled 2023-07-15 (×4): qty 1

## 2023-07-15 NOTE — H&P (Addendum)
Hospital Admission History and Physical Service Pager: [phone]  Patient name: Maria [name] Medical record number: [id number] Date of Birth: 05-05-1956 Age: 67 y.o. Gender: female  Primary Care Provider: Ignatius Specking, NP Consultants: none Code Status: FULL which was confirmed with family if patient unable to confirm   Preferred Emergency Contact:  Contact Information     Name Relation Home Work Mobile   Maria [name],Maria [name] Daughter [phone]  [phone]   Maria [name] Sister [phone]        Other Contacts   None on File      Chief Complaint: Generalized Weakness  Assessment and Plan: Maria [name] is a 67 y.o. female presenting with generalized weakness. Differential for presentation of this includes electrolyte derangement, metabolic derangement, worsening neuropathy, stroke, infectious etiology, inflammatory etiology such as PMR, thyroid derangement, AKI, medication side effect/misadventure, hypercapnia related to possible OSA.  Most likely etiology is electrolyte derangement due to potassium 2.5, possibly secondary to  poor PO intake, home potassium supplementation non-compliance, and increased dose of torsemide recently.  Another likely etiology is medication side effect, due to age >57 and being on chronic benzodiazepine, opioid, and just recently started gabapentin.  Patient with AKI as creatinine is increased from baseline-expect this could exacerbate medication side effects from gabapentin specifically.  Next most likely etiology is likely thyroid derangement.  Most recent TSH in 2/24 2.685. Plan to recheck thyroid. Will also consider inflammatory etiology or stroke - patient has LLE weakness and pain in her proximal hip.  Will workup with labs CRP, ESR.    Consider hypercapnia related to possible OSA.  Patient reports she has had a sleep study in the past, but it revealed restless leg syndrome and no evidence of OSA.  Notably, bicarb elevated at 34 but  her mentation is intact. Anemia on the differential but hemoglobin is 12.1.  No evidence of active bleeding, and patient denies any known bleeding.  Very unlikely to be related to an infectious etiology as patient is afebrile, no leukocytosis.  May still consider Guillain-Barr type reaction, however patient does not report any recent illnesses.  If all of the above are ruled out, consider deconditioning.  General weakness Most likely secondary to electrolyte derangement, K+ 2.5.  Possibly related to patient chronic use of benzodiazepine, opioid, with recent addition of gabapentin.  This may also be related to dehydration secondary to decreased p.o. intake and increase in diuretic. - Admit to FMTS, attending Dr. Manson Passey - Med-Tele, Vital signs per floor - Regular diet  - PT/OT to treat - VTE prophylaxis with Lovenox -TSH, CRP, ESR, CK, B12 - AM BMP  - Fall precautions - Hold gabapentin - Hold pramipexole (prescribed for RLS) - Continue home oxycodone 10 mg 4 times daily-consider weaning - Continue home Klonopin 1 mg twice daily as needed - MRI brain due to left LE weakness to r/o stroke - EKG pending  - Orthostatic vital signs - Cardiac monitoring for 24 hrs until K stabilizes    Hypokalemia K+ 2.5.  Possibly secondary to recent increase in torsemide, metolazone, poor p.o. intake, thyroid derangement. S/p 30 mEq K+ IV repletion in the ED. - Check TSH - Plan to give 60 mEq K+ p.o. - Recheck BMP 1700 - AM BMP - Hold torsemide at this time - Restart spironolactone 25 mg - Consider adding back home Klor-Con BID  (HFpEF) heart failure with preserved ejection fraction (HCC) Echo 02/24 showed LVEF 60 to 65%, with grade 2 diastolic dysfunction, RVSP 47.  Home meds are spironolactone 25 mg, torsemide 80 mg in the morning and 60 mg in the evening, metolazone 5 mg.  Dry weight 346 pounds. - Strict I/Os - Daily weights - Holding diuretics at this time - No repeat echo at this time as  most recent was within last 6 months, no evidence of acute exacerbation  Abdominal pain Generalized abdominal pain. Most likely secondary to constipation; CT AP without acute abnormality, but did show some stool burden. Last BM 7/21. Also chronic opioid user. Labs significant for Tbili 1.9. AST, ALT, Lipase WNL. Does endorse some nausea, has zofran 4mg  at home. - RUQ [place] due to increased bilirubin to rule out hepatobiliary etiology - AM hepatic panel - will add on zofran once EKG results to monitor for Qtc prolongation - miralax BID, Senna 1 tab BID  AKI (acute kidney injury) (HCC) Cr 1.68 on arrival, baseline around 1.1 Likely secondary to decreased PO intake recently, as well as recent increase in Torsemide. Hx of CKD3. - restarting home farxiga, monitor for worsening kidney function - Avoid nephrotoxic agents     Chronic and Stable Conditions: HTN - continue Lopressor 25mg  daily GERD - continue protonix 40mg  daily Anxiety, Depression - continue celexa 20mg , klonopin 1mg  BID PRN Chronic pain - continue oxycodone 10mg  4 times daily T2DM - continue farxiga 5mg ; CBG checks before meals and at bedtime Lymphedema - TED hose COPD - continue albuterol inhaler 2 puffs q6h prn; Brovana nebulizer BID Restless Leg Syndrome - holding pramipexole  FEN/GI: Regular diet VTE Prophylaxis: lovenox  Disposition: MedSurg  History of Present Illness:  Maria [name] is a 67 y.o. female presenting with weakness, bilateral foot and leg pain.  She fell asleep on the couch last night and when she woke this morning around 4 AM difficulty getting up.  She stated that she felt so weak that it took her an hour to stand at which point she was able to make it to her front door to unlock her front door in anticipation of calling EMS.  She denies headache, chest pain, shortness of breath.  She does report some abdominal pain, likely related to constipation, as she has not had a bowel movement in 3 to 4 days which  she reports is very unusual for her. She reports a decreased appetite in the last 1 to 2 weeks, with some nausea.  Denies vomiting. She reports she developed neuropathy in bilateral feet in the last 2 to 3 weeks and as such was recently started on gabapentin.  She took her first and only dose of gabapentin yesterday evening.    In the ED, VSS.  On exam, patient had diffuse abdominal pain.  Patient endorses generalized weakness.  Patient had lab work which reflected K+ 2.5 and elevated creatinine of 1.68.  Sodium low at 130.  Patient is afebrile, white count normal, noninfectious picture.  Magnesium 1.8.  CT AP negative though notable for scattered diverticula primarily in the sigmoid [place], no signs of diverticulitis.  Appears to be some evidence of constipation on CTAP. Cardiac monitor tracing shows NSR, no EKG done.  Review Of Systems: Per HPI.  Pertinent Past Medical History: Anxiety, depression HTN CHF - HFpEF, with grade 2 diastolic dysfunction, RVSP 47 Diabetes GERD HLD Hepatitis (age 59, hep A) Remainder reviewed in history tab.   Pertinent Past Surgical History: Hysterectomy (1999) Cervical disc surgery Polypectomy Right knee arthroscopy Remainder reviewed in history tab.   Pertinent Social History: Tobacco use: Former, quit 2009 Alcohol  use: No Other Substance use: No Lives with self  Pertinent Family History: Mother-hypertension, diabetes, heart failure Father-hypertension Remainder reviewed in history tab.   Important Outpatient Medications: ProAir inhaler Brovana nebulizer Celexa 20 mg Klonopin 1 mg Farxiga 5 mg DuoNeb Metolazone 5 mg Oxycodone 10 mg Protonix 40 mg Klor-Con 20 mEq Mirapex 0.5 mg Aldactone 25 mg Torsemide 80 mg in the morning, 60 mg in the evening Remainder reviewed in medication history.   Objective: BP (!) 109/55 (BP Location: Left Wrist)   Pulse 69   Temp 98.3 F (36.8 C) (Oral)   Resp 20   SpO2 96%  Exam: General:  Tired-appearing, no acute distress Cardio: RRR, no murmurs on exam. Pulm: Clear, no wheezing, no crackles. No increased work of breathing.  On room air. Abdominal: bowel sounds present, soft.  Moderately tender to palpation, particularly on the left side. Mildly distended. Extremities: Bilateral LE edema with scaling skin. Psych:  Cognition and judgment appear intact. Alert, communicative  and cooperative with normal attention span and concentration. No apparent delusions, illusions, hallucinations. Neuro: CN II: PERRL CN III, IV,VI: EOMI CVII: Symmetric smile and brow raise CN VIII: Normal hearing CN XI: 5/5 shoulder shrug UE strength 5/5. LE strength demonstrated more proximal muscle weakness. Left sided weakness with hip flexion as compared to right side. Left sided weakness with dorsiflexion; strength intact and equal with plantarflexion. Normal sensation in UE and LE bilaterally.   Labs:  CBC BMET  Recent Labs  Lab 07/15/23 0929  WBC 10.3  HGB 12.1  HCT 36.6  PLT 257   Recent Labs  Lab 07/15/23 0929  NA 130*  K 2.5*  CL 84*  CO2 34*  BUN 21  CREATININE 1.68*  GLUCOSE 117*  CALCIUM 8.6*    Tbili 1.9 Lipase 35 AST 21 ALT 14 Magnesium 1.8  Imaging Studies Performed: 07/15/2023 CTAP: 1. No acute inflammatory process identified within the abdomen or pelvis. 2. Multiple other nonacute observations, as described above.   I have personally reviewed the above imaging and agree with the radiologist interpretation.  Glendale Chard, DO 07/15/2023, 3:05 PM PGY-2, [facility]  FPTS Intern pager: [phone], text pages welcome Secure chat group [name] Teaching Service

## 2023-07-15 NOTE — ED Notes (Signed)
ED TO INPATIENT HANDOFF REPORT  ED Nurse Name and Phone #: Beatris Ship RN [phone]  S Name/Age/Gender Maria [name] 67 y.o. female Room/Bed: H021C/H021C  Code Status   Code Status: Full Code  Home/SNF/Other Home Patient oriented to: self, place, time, and situation Is this baseline? Yes   Triage Complete: Triage complete  Chief Complaint General weakness [R53.1]  Triage Note Pt BIB [facility] from home d/t bil feet/leg pain. Reports she was unable to get off her cough after an hr of trying & was recently Dx with neuropathy this past Thursday (per pt). 112/86, 84 bpm, 93% on RA, 132 CBG, 18 Resp, A/Ox4.    Allergies Allergies  Allergen Reactions   Amoxicillin-Pot Clavulanate Nausea And Vomiting    Projectile vomiting Did it involve swelling of the face/tongue/throat, SOB, or low BP? No Did it involve sudden or severe rash/hives, skin peeling, or any reaction on the inside of your mouth or nose? No Did you need to seek medical attention at a hospital or doctor's office? No When did it last happen?      20-30 years If all above answers are "NO", may proceed with cephalosporin use.    Adhesive [Tape] Other (See Comments)    Tears skin off - use paper tape    Codeine Other (See Comments)    hallucinations   Crestor [Rosuvastatin Calcium]     Severe muscle cramps   Lyrica [Pregabalin] Other (See Comments)    weakness   Morphine And Codeine Nausea And Vomiting and Other (See Comments)    Migraine headaches   Vicodin [Hydrocodone-Acetaminophen] Other (See Comments)    hallucinations   Latex Rash    Level of Care/Admitting Diagnosis ED Disposition     ED Disposition  Admit   Condition  --   Comment  Hospital Area: [facility] [[id number]]  Level of Care: Med-Surg [16]  May place patient in observation at [facility] or [facility] if equivalent level of care is available:: No  Covid Evaluation: Asymptomatic - no recent exposure (last 10 days) testing not  required  Diagnosis: General weakness [301173]  Admitting Physician: Cyndia Skeeters [[id number]]  Attending Physician: Westley Chandler [[id number]]          B Medical/Surgery History Past Medical History:  Diagnosis Date   Acute lymphadenitis 2011   ALLERGIC RHINITIS 08/10/2007   ANXIETY 12/06/2007   Atherosclerotic peripheral vascular disease (HCC) 06/13/2013   Aorta on CT June 2014   Cellulitis 2011   Cervical disc disease 03/09/2012   CHF (congestive heart failure) (HCC)    Chronic pain 03/09/2012   DEPRESSION 12/06/2007   Diabetes (HCC)    DIVERTICULOSIS, [place] 12/06/2007   GERD 12/06/2007   Hepatitis    age 41 hepatitis A   HLD (hyperlipidemia) 05/24/2019   HYPERTENSION 12/06/2007   Lumbar disc disease 03/09/2012   Mesenteric adenitis    MRSA 2006   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 2010   CERVICAL DISC SURGERY  may 24th 2010   COLONOSCOPY WITH PROPOFOL N/A 07/10/2016   Procedure: COLONOSCOPY WITH PROPOFOL;  Surgeon: Ruffin Frederick, MD;  Location: [facility];  Service: Gastroenterology;  Laterality: N/A;   COLONOSCOPY WITH PROPOFOL N/A 09/26/2019   Procedure: COLONOSCOPY WITH PROPOFOL;  Surgeon: Adela Lank,  Willaim Rayas, MD;  Location: [facility];  Service: Gastroenterology;  Laterality: N/A;   ESOPHAGOGASTRODUODENOSCOPY (EGD) WITH PROPOFOL N/A 07/10/2016   Procedure: ESOPHAGOGASTRODUODENOSCOPY (EGD) WITH PROPOFOL;  Surgeon: Ruffin Frederick, MD;  Location: [facility];  Service: Gastroenterology;  Laterality: N/A;   POLYPECTOMY  09/26/2019   Procedure: POLYPECTOMY;  Surgeon: Benancio Deeds, MD;  Location: [facility];  Service: Gastroenterology;;   s/p ovary cyst     s/p right knee arthroscopy     Dr. Dion Saucier ortho     A IV  Location/Drains/Wounds Patient Lines/Drains/Airways Status     Active Line/Drains/Airways     Name Placement date Placement time Site Days   Peripheral IV 07/15/23 20 G Right Antecubital 07/15/23  1226  Antecubital  less than 1   Wound / Incision (Open or Dehisced) 02/08/23 Irritant Dermatitis (Moisture Associated Skin Damage) Buttocks Medial 02/08/23  0120  Buttocks  157   Wound / Incision (Open or Dehisced) 02/08/23 Other (Comment) Pretibial Right 02/08/23  0635  Pretibial  157   Wound / Incision (Open or Dehisced) 02/08/23 Other (Comment) Pretibial Left 02/08/23  0636  Pretibial  157            Intake/Output Last 24 hours No intake or output data in the 24 hours ending 07/15/23 1351  Labs/Imaging Results for orders placed or performed during the hospital encounter of 07/15/23 (from the past 48 hour(s))  Comprehensive metabolic panel     Status: Abnormal   Collection Time: 07/15/23  9:29 AM  Result Value Ref Range   Sodium 130 (L) 135 - 145 mmol/L   Potassium 2.5 (LL) 3.5 - 5.1 mmol/L    Comment: CRITICAL RESULT CALLED TO, READ BACK BY AND VERIFIED WITH P.Danise Dehne,RN 1047 07/15/23 CLARK,S   Chloride 84 (L) 98 - 111 mmol/L   CO2 34 (H) 22 - 32 mmol/L   Glucose, Bld 117 (H) 70 - 99 mg/dL    Comment: Glucose reference range applies only to samples taken after fasting for at least 8 hours.   BUN 21 8 - 23 mg/dL   Creatinine, Ser 0.86 (H) 0.44 - 1.00 mg/dL   Calcium 8.6 (L) 8.9 - 10.3 mg/dL   Total Protein 7.2 6.5 - 8.1 g/dL   Albumin 3.5 3.5 - 5.0 g/dL   AST 21 15 - 41 U/L   ALT 14 0 - 44 U/L   Alkaline Phosphatase 66 38 - 126 U/L   Total Bilirubin 1.9 (H) 0.3 - 1.2 mg/dL   GFR, Estimated 33 (L) >60 mL/min    Comment: (NOTE) Calculated using the CKD-EPI Creatinine Equation (2021)    Anion gap 12 5 - 15    Comment: Performed at [facility], [address], [place], [place] [address]  Lipase, blood     Status: None   Collection Time: 07/15/23  9:29 AM  Result Value  Ref Range   Lipase 35 11 - 51 U/L    Comment: Performed at [facility], [address], [place], [place] [address]  CBC with Differential     Status: None   Collection Time: 07/15/23  9:29 AM  Result Value Ref Range   WBC 10.3 4.0 - 10.5 K/uL   RBC 4.15 3.87 - 5.11 MIL/uL   Hemoglobin 12.1 12.0 - 15.0 g/dL   HCT 28.4 13.2 - 44.0 %   MCV 88.2 80.0 - 100.0 fL   MCH 29.2 26.0 - 34.0 pg   MCHC 33.1 30.0 - 36.0 g/dL  RDW 13.7 11.5 - 15.5 %   Platelets 257 150 - 400 K/uL   nRBC 0.0 0.0 - 0.2 %   Neutrophils Relative % 69 %   Neutro Abs 7.1 1.7 - 7.7 K/uL   Lymphocytes Relative 22 %   Lymphs Abs 2.2 0.7 - 4.0 K/uL   Monocytes Relative 7 %   Monocytes Absolute 0.7 0.1 - 1.0 K/uL   Eosinophils Relative 1 %   Eosinophils Absolute 0.1 0.0 - 0.5 K/uL   Basophils Relative 1 %   Basophils Absolute 0.1 0.0 - 0.1 K/uL   Immature Granulocytes 0 %   Abs Immature Granulocytes 0.04 0.00 - 0.07 K/uL    Comment: Performed at [facility], [address], [place], [place] [address]  Magnesium     Status: None   Collection Time: 07/15/23  9:29 AM  Result Value Ref Range   Magnesium 1.8 1.7 - 2.4 mg/dL    Comment: Performed at [facility], [address], [place], [place] [address]   CT ABDOMEN PELVIS WO CONTRAST  Result Date: 07/15/2023 CLINICAL DATA:  Abdominal pain, acute, nonlocalized EXAM: CT ABDOMEN AND PELVIS WITHOUT CONTRAST TECHNIQUE: Multidetector CT imaging of the abdomen and pelvis was performed following the standard protocol without IV contrast. RADIATION DOSE REDUCTION: This exam was performed according to the departmental dose-optimization program which includes automated exposure control, adjustment of the mA and/or kV according to patient size and/or use of iterative reconstruction technique. COMPARISON:  CT scan abdomen and pelvis from 12/09/2022. FINDINGS: Lower chest: The lung bases are clear. No pleural effusion. The heart is normal in size. No pericardial  effusion. Hepatobiliary: The liver is normal in size. Non-cirrhotic configuration. No suspicious mass. No intrahepatic or extrahepatic bile duct dilation. No calcified gallstones. Normal gallbladder wall thickness. No pericholecystic inflammatory changes. Pancreas: Unremarkable. No pancreatic ductal dilatation or surrounding inflammatory changes. Spleen: Within normal limits. No focal lesion. Adrenals/Urinary Tract: Adrenal glands are unremarkable. No suspicious renal mass. No hydronephrosis. No renal or ureteric calculi. Unremarkable urinary bladder. Stomach/Bowel: No disproportionate dilation of the small or large bowel loops. No evidence of abnormal bowel wall thickening or inflammatory changes. The appendix is unremarkable. There are scattered diverticula mainly in the sigmoid [place], without imaging signs of diverticulitis. Vascular/Lymphatic: No ascites or pneumoperitoneum. No abdominal or pelvic lymphadenopathy, by size criteria. No aneurysmal dilation of the major abdominal arteries. There are moderate peripheral atherosclerotic vascular calcifications of the aorta and its major branches. Reproductive: The uterus is surgically absent. No large adnexal mass. Other: There is a small fat containing periumbilical hernia. The soft tissues and abdominal wall are otherwise unremarkable. Musculoskeletal: No suspicious osseous lesions. There are mild multilevel degenerative changes in the visualized spine. IMPRESSION: 1. No acute inflammatory process identified within the abdomen or pelvis. 2. Multiple other nonacute observations, as described above. Electronically Signed   By: Jules Schick M.D.   On: 07/15/2023 11:32    Pending Labs Unresulted Labs (From admission, onward)     Start     Ordered   07/16/23 0500  Basic metabolic panel  Tomorrow morning,   R        07/15/23 1340   07/16/23 0500  CBC  Tomorrow morning,   R        07/15/23 1340   07/15/23 1340  TSH  Add-on,   AD        07/15/23 1340             Vitals/Pain Today's  Vitals   07/15/23 0855 07/15/23 0859 07/15/23 1207  BP:  (!) 123/42 (!) 127/45  Pulse:  72 74  Resp:  20 (!) 24  Temp:  97.7 F (36.5 C) 98 F (36.7 C)  TempSrc:  Oral Oral  SpO2:  96% 94%  PainSc: 10-Worst pain ever      Isolation Precautions No active isolations  Medications Medications  potassium chloride 10 mEq in 100 mL IVPB (10 mEq Intravenous New Bag/Given 07/15/23 1337)  enoxaparin (LOVENOX) injection 30 mg (has no administration in time range)  polyethylene glycol (MIRALAX / GLYCOLAX) packet 17 g (has no administration in time range)  senna-docusate (Senokot-S) tablet 1 tablet (has no administration in time range)  gabapentin (NEURONTIN) capsule 300 mg (300 mg Oral Given 07/15/23 0936)    Mobility walks with device      R Recommendations: See Admitting Provider Note  Report given to: 5C08

## 2023-07-15 NOTE — Progress Notes (Addendum)
FMTS Brief Progress Note  S: Went to go assess patient at bedside with Dr. Georg Ruddle.  Patient still feeling generalized weakness.  Denies any significant abdominal pain but did have stools after MiraLAX/senna administration.  We discussed that there are large amounts of stool in her CT.  She had 1 episode of vomiting as well.  Currently awaiting her EKG for seeing if we can add any antiemetics.  She has chronic left-sided back pain but otherwise is doing okay.  Discussed that we are replating her electrolytes more.   O: BP (!) 94/43 (BP Location: Left Arm)   Pulse 67   Temp (!) 97.5 F (36.4 C)   Resp 18   Ht 5\' 3"  (1.6 m)   Wt (!) 147.6 kg   SpO2 95%   BMI 57.66 kg/m    General: NAD, awake, alert, responsive to questions Head: Normocephalic atraumatic CV: Difficult to auscultate due to body habitus Respiratory: chest rises symmetrically,  no increased work of breathing Abdomen: Soft, mildly tender to palpation diffusely, non-distended, normoactive bowel sounds  Extremities: Pitting edema with erythematous scaling/venous insufficiency present bilaterally  A/P:  Generalized weakness  electrolyte derangements Potassium returned at 2.4 from previous of 2.5.  Magnesium on previous check mildly decreased at 1.8 which was not repleted. -Plan to replete with an additional 5 rounds of IV and 40 p.o. K -2 g IV mag -Recheck in AM -AM EKG -Qtc elevated to 480 will trial scopolamine patch, if still nauseous will give one time doses of compazine  Venous insufficiency I have asked nursing to place ACE wraps around her lower extremity to help with the swelling.   - Orders reviewed. Labs for AM ordered, which was adjusted as needed.    Levin Erp, MD 07/15/2023, 8:45 PM PGY-3, [facility] Night Resident  Please page [phone] with questions.

## 2023-07-15 NOTE — Assessment & Plan Note (Addendum)
Cr 1.68 on arrival, baseline around 1.1 Likely secondary to decreased PO intake recently, as well as recent increase in Torsemide. Hx of CKD3. - restarting home farxiga, monitor for worsening kidney function

## 2023-07-15 NOTE — Assessment & Plan Note (Addendum)
Most likely secondary to electrolyte derangement, K+ 2.5.  Possibly related to patient chronic use of benzodiazepine, opioid, with recent addition of gabapentin.  This may also be related to dehydration secondary to decreased p.o. intake and increase in diuretic. - Admit to FMTS, attending Dr. Manson Passey -MedSurg, Vital signs per floor -Regular diet  - PT/OT to treat - VTE prophylaxis with Lovenox -TSH, CRP, ESR, CK, B12 - AM BMP  - Fall precautions -Hold gabapentin - hold pramipexole  - Continue home oxycodone 10 mg 4 times daily-consider weaning -Continue home Klonopin 1 mg twice daily as needed - order MRI brain due to left LE weakness to r/o stroke -EKG -Orthostatic vital signs

## 2023-07-15 NOTE — Assessment & Plan Note (Addendum)
K+ 2.5.  Possibly secondary to recent increase in torsemide, metolazone, poor p.o. intake, thyroid derangement. S/p 30 mEq K+ IV repletion in the ED. -Check TSH -Plan to give 60 mEq K+ p.o. -Recheck BMP 1700 -AM BMP -Hold torsemide at this time -Restart spironolactone 25 mg - consider adding back home Klor-Con BID

## 2023-07-15 NOTE — Progress Notes (Signed)
8:28 PM Daughter called saying she received a call from pharmacy that her mom is not alert. Daughter called to confirm with RN. Patient is A&O X4  and reported the same to the daughter.

## 2023-07-15 NOTE — Plan of Care (Signed)
  Problem: Education: Goal: Knowledge of General Education information will improve Description: Including pain rating scale, medication(s)/side effects and non-pharmacologic comfort measures Outcome: Progressing   Problem: Activity: Goal: Risk for activity intolerance will decrease Outcome: Progressing   Problem: Coping: Goal: Level of anxiety will decrease Outcome: Progressing   Problem: Skin Integrity: Goal: Risk for impaired skin integrity will decrease Outcome: Progressing   

## 2023-07-15 NOTE — Progress Notes (Signed)
Received critical lab results for potassium . Its 2.4 per lab tech [name] at [place].  Paged the MD Baloch  and made her aware. Made the Night shift RN aware.

## 2023-07-15 NOTE — Care Management (Addendum)
Transition of Care [facility]) - Emergency Department Mini Assessment   Patient Details  Name: Maria [name] MRN: [id number] Date of Birth: 11/18/1956  Transition of Care [facility]) CM/SW Contact:    Lavenia Atlas, RN Phone Number: 07/15/2023, 11:44 AM   Clinical Narrative: Per chart review patient currently at [facility] ED for neuropathy (c/o bil leg/leg pain), received TOC consult for HHPT/OT, RN, HHA, SW services. This RNCM received secure chat from ED SW regarding her attempts to obtain [facility] services w/no success( Declined: [facility], [place], [place]).  This RNCM also reached to the following HH agency:  Haywood Lasso w/[facility]- declined,  Lannette Donath w/[facility]: declined Morrie Sheldon w/[facility]: declined Will notified EDP  - 12:20pm This RNCM spoke with Kandee Keen with [facility] who is able to accept patient for HHPT, HHA, SW. [facility] is unable to accept for [facility], as patient may need to be connected with lymphedema clinic. Notified EDP, RN.  TOC will continue to follow for needs.  ED Mini Assessment: What brought you to the Emergency Department? : bilateral fett/leg pain  Barriers to Discharge: Continued Medical Work up  [name] interventions: attempted to coordinate home health services     Interventions which prevented an admission or readmission: Home Health Consult or Services    Patient Contact and Communications        ,          Patient states their goals for this hospitalization and ongoing recovery are:: to feel better CMS Medicare.gov Compare Post Acute Care list provided to:: Patient Choice offered to / list presented to : Patient  Admission diagnosis:  B[place]atteral Leg pain Patient Active Problem List   Diagnosis Date Noted   Acute on chronic respiratory failure with hypoxia and hypercapnia (HCC) 02/08/2023   Pneumonia due to infectious organism 02/05/2023   Chronic right-sided congestive heart failure (HCC) 02/04/2023   Fall 02/02/2023   UTI (urinary tract infection) 07/22/2022    Weakness 07/22/2022   Fluid overload 07/21/2022   Acute cystitis with hematuria    Dyspnea 12/19/2021   Leg swelling 11/20/2021   Bilateral knee pain 10/29/2021   Hypernatremia 09/24/2021   Cellulitis of lower extremity 09/24/2021   CKD (chronic kidney disease), stage IIIa (HCC) 09/24/2021   Sacral decubitus ulcer, stage II (HCC) 09/20/2021   Acute on chronic heart failure with preserved ejection fraction (HFpEF) (HCC) 09/19/2021   Nail disorder 07/07/2021   Pre-ulcerative corn or callous 07/07/2021   Rash 07/07/2021   Acute kidney injury superimposed on chronic kidney disease (HCC) 04/06/2021   Diastolic congestive heart failure (HCC) 03/17/2021   Diabetes mellitus type 2 in obese 03/11/2021   Vitamin D deficiency 01/01/2021   Medial epicondylitis 12/31/2020   Aortic atherosclerosis (HCC) 09/26/2020   Umbilical hernia 09/26/2020   Constipation 08/05/2020   Arthritis 06/12/2020   Hoarseness 05/01/2020   Vitamin B 12 deficiency 01/31/2020   Rosacea 01/31/2020   Neck swelling 01/31/2020   History of colonic polyps    Benign neoplasm of [place]    Nausea 08/31/2019   Throat pain 07/26/2019   HLD (hyperlipidemia) 05/24/2019   Leg cramps 05/24/2019   Lump in neck 05/24/2019   Left thyroid nodule 01/25/2019   Jerking movements of extremities 07/20/2018   Balance disorder 07/20/2018   Right knee pain 03/21/2018   OSA (obstructive sleep apnea) 03/21/2018   Oxygen desaturation 03/21/2018   Urinary symptom or sign 03/21/2018   Lymphedema 01/25/2018   Gait disorder 01/25/2018   Dysphonia 12/07/2017   Encounter for well adult exam with  abnormal findings 11/08/2017   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   Downen polyp 08/11/2016   Abdominal pain, epigastric    Dysphagia    [place] cancer screening    ACE-inhibitor cough 12/05/2015   Restless legs syndrome 10/08/2015   Venous stasis dermatitis of both lower extremities 04/26/2015   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/17/2014    AR (allergic rhinitis) 10/17/2014   Fatty liver 10/17/2014   Chronic pain syndrome 09/19/2014   Post-traumatic osteoarthritis of both knees 09/19/2014   Spinal stenosis 09/19/2014   Depression, major, single episode, complete remission (HCC) 09/19/2014   Narcotic dependence (HCC) 09/19/2014   Acute hypoxic respiratory failure (HCC) 08/20/2014   False positive HIV serology 06/02/2013   Hypokalemia 06/29/2012   Spondylosis of 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 03/09/2012   Lumbar disc disease 03/09/2012   Chronic pain 03/09/2012   Morbid obesity due to excess calories (HCC) 06/17/2011   Chronic low back pain 12/28/2008   Anxiety state 12/06/2007   Depression 12/06/2007   HTN (hypertension) 12/06/2007   Gastroesophageal reflux disease without esophagitis 12/06/2007   LOW BACK PAIN 12/06/2007   PCP:  Ignatius Specking, NP Pharmacy:   [facility] [phone] [address] - [place], [place] - [address] [place] [address] Phone: [phone] Fax: [phone]  [facility] - [place], [place] - [address]  [address] [place] [address] Phone: [phone] Fax: [phone]  [facility] [address] [place], [place] - [address] [place], [place] [address] [place], [place] [address] [place] [address] Phone: [phone] Fax: [phone]

## 2023-07-15 NOTE — Progress Notes (Signed)
Patient was transferred from ED at 1530 via bed. Alert and oriented

## 2023-07-15 NOTE — Progress Notes (Signed)
Provided prayer and presence.  Pt wanting to sleep.  Chaplain will follow as needed.  Venida Jarvis, [place], [facility], Pager [phone]

## 2023-07-15 NOTE — ED Provider Notes (Signed)
Emergency Department Provider Note   I have reviewed the triage vital signs and the nursing notes.   HISTORY  Chief Complaint Maria [name]/Leg pain   HPI Maria [name] is a 67 y.o. female past history of diabetes, hypertension, and newly diagnosed peripheral neuropathy presents to the emergency department with worsening pain in the bilateral [name] with leg swelling.  She states the leg swelling is chronic.  She is having burning pain in the [name] and notes that she was recently started on gabapentin 300 mg which she takes before bed.  She is taken 1 or 2 doses of this so far with little relief.  Denies any falls.  She states she has had some trouble sleeping and was trying to get up off of her sofa today, using her walker, and it took around 1 hour to get up and going.  She reports some associated generalized abdominal discomfort and constipation.  Her last bowel movement was 3 to 4 days prior. No vomiting or fever.    Past Medical History:  Diagnosis Date   Acute lymphadenitis 2011   ALLERGIC RHINITIS 08/10/2007   ANXIETY 12/06/2007   Atherosclerotic peripheral vascular disease (HCC) 06/13/2013   Aorta on CT June 2014   Cellulitis 2011   Cervical disc disease 03/09/2012   CHF (congestive heart failure) (HCC)    Chronic pain 03/09/2012   DEPRESSION 12/06/2007   Diabetes (HCC)    DIVERTICULOSIS, [place] 12/06/2007   GERD 12/06/2007   Hepatitis    age 82 hepatitis A   HLD (hyperlipidemia) 05/24/2019   HYPERTENSION 12/06/2007   Lumbar disc disease 03/09/2012   Mesenteric adenitis    MRSA 2006   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HCC) 11/08/2017   THORACIC/LUMBOSACRAL NEURITIS/RADICULITIS UNSPEC 12/28/2008    Review of Systems  Constitutional: No fever/chills Cardiovascular: Denies chest pain. Respiratory: Denies shortness of breath. Gastrointestinal: Positive abdominal pain.  No nausea, no vomiting.  No diarrhea. Positive constipation. Genitourinary: Negative for dysuria. Musculoskeletal:  Negative for back pain. Skin: Negative for rash. Neurological: Negative for headaches.  ____________________________________________   PHYSICAL EXAM:  VITAL SIGNS: ED Triage Vitals  Encounter Vitals Group     BP 07/15/23 0859 (!) 123/42     Pulse Rate 07/15/23 0859 72     Resp 07/15/23 0859 20     Temp 07/15/23 0859 97.7 F (36.5 C)     Temp Source 07/15/23 0859 Oral     SpO2 07/15/23 0859 96 %   Constitutional: Alert and oriented. Well appearing and in no acute distress. Eyes: Conjunctivae are normal.  Head: Atraumatic. Nose: No congestion/rhinnorhea. Mouth/Throat: Mucous membranes are moist.   Neck: No stridor.   Cardiovascular: Normal rate, regular rhythm. Good peripheral circulation. Grossly normal heart sounds.  2+ DP pulses bilaterally. Respiratory: Normal respiratory effort.  No retractions. Lungs CTAB. Gastrointestinal: Soft and nontender. No distention.  Musculoskeletal: No lower extremity tenderness with bilateral LE edema. No gross deformities of extremities. Neurologic:  Normal speech and language. No gross focal neurologic deficits are appreciated.  Skin:  Skin is warm, dry and intact. No rash noted.   ____________________________________________   LABS (all labs ordered are listed, but only abnormal results are displayed)  Labs Reviewed  COMPREHENSIVE METABOLIC PANEL - Abnormal; Notable for the following components:      Result Value   Sodium 130 (*)    Potassium 2.5 (*)    Chloride 84 (*)    CO2 34 (*)    Glucose, Bld 117 (*)    Creatinine,  Ser 1.68 (*)    Calcium 8.6 (*)    Total Bilirubin 1.9 (*)    GFR, Estimated 33 (*)    All other components within normal limits  C-REACTIVE PROTEIN - Abnormal; Notable for the following components:   CRP 1.0 (*)    All other components within normal limits  CK - Abnormal; Notable for the following components:   Total CK 33 (*)    All other components within normal limits  VITAMIN B12 - Abnormal; Notable for  the following components:   Vitamin B-12 158 (*)    All other components within normal limits  BASIC METABOLIC PANEL - Abnormal; Notable for the following components:   Sodium 132 (*)    Potassium 2.4 (*)    Chloride 86 (*)    CO2 35 (*)    Glucose, Bld 107 (*)    Creatinine, Ser 1.47 (*)    Calcium 8.4 (*)    GFR, Estimated 39 (*)    All other components within normal limits  HEMOGLOBIN A1C - Abnormal; Notable for the following components:   Hgb A1c MFr Bld 6.0 (*)    All other components within normal limits  CBC - Abnormal; Notable for the following components:   Hemoglobin 11.8 (*)    HCT 35.4 (*)    All other components within normal limits  HEPATIC FUNCTION PANEL - Abnormal; Notable for the following components:   Total Protein 6.3 (*)    Albumin 3.1 (*)    Total Bilirubin 1.8 (*)    Indirect Bilirubin 1.6 (*)    All other components within normal limits  BASIC METABOLIC PANEL - Abnormal; Notable for the following components:   Potassium 3.2 (*)    Chloride 90 (*)    CO2 37 (*)    Glucose, Bld 126 (*)    Creatinine, Ser 1.34 (*)    Calcium 8.8 (*)    GFR, Estimated 44 (*)    All other components within normal limits  GLUCOSE, CAPILLARY - Abnormal; Notable for the following components:   Glucose-Capillary 153 (*)    All other components within normal limits  GLUCOSE, CAPILLARY - Abnormal; Notable for the following components:   Glucose-Capillary 115 (*)    All other components within normal limits  LIPASE, BLOOD  CBC WITH DIFFERENTIAL/PLATELET  MAGNESIUM  SEDIMENTATION RATE  TSH  GLUCOSE, CAPILLARY  MAGNESIUM  BASIC METABOLIC PANEL  MAGNESIUM   ____________________________________________  RADIOLOGY  US Abdomen Limited RUQ (LIVER/GB)  Result Date: 07/16/2023 CLINICAL DATA:  Hyperbilirubinemia EXAM: ULTRASOUND ABDOMEN LIMITED RIGHT UPPER QUADRANT COMPARISON:  CT from earlier in the same day. FINDINGS: Gallbladder: No gallstones or wall thickening  visualized. No sonographic Murphy sign noted by sonographer. 5 mm polyp is noted. Common bile duct: Diameter: 5.4 mm. Liver: Increased in echogenicity consistent with fatty infiltration. Portal vein is patent on color Doppler imaging with normal direction of blood flow towards the liver. Other: None. IMPRESSION: Fatty liver. 5 mm gallbladder polyp. Per consensus statement, gallbladder polyps less than 6 mm are usually benign not requiring follow-up Electronically Signed   By: Alcide Clever M.D.   On: 07/16/2023 01:04   CT HEAD WO CONTRAST ( )  Result Date: 07/15/2023 CLINICAL DATA:  Neuro deficit, acute, stroke suspected EXAM: CT HEAD WITHOUT CONTRAST TECHNIQUE: Contiguous axial images were obtained from the base of the skull through the vertex without intravenous contrast. RADIATION DOSE REDUCTION: This exam was performed according to the departmental dose-optimization program which includes automated exposure control,  adjustment of the mA and/or kV according to patient size and/or use of iterative reconstruction technique. COMPARISON:  02/02/2023 FINDINGS: Brain: No acute intracranial abnormality. Specifically, no hemorrhage, hydrocephalus, mass lesion, acute infarction, or significant intracranial injury. Vascular: No hyperdense vessel or unexpected calcification. Skull: No acute calvarial abnormality. Sinuses/Orbits: No acute findings Other: None IMPRESSION: No acute intracranial abnormality. Electronically Signed   By: Charlett Nose M.D.   On: 07/15/2023 23:07    ____________________________________________   PROCEDURES  Procedure(s) performed:   Procedures  CRITICAL CARE Performed by: Maia Plan Total critical care time: 35 minutes Critical care time was exclusive of separately billable procedures and treating other patients. Critical care was necessary to treat or prevent imminent or life-threatening deterioration. Critical care was time spent personally by me on the following  activities: development of treatment plan with patient and/or surrogate as well as nursing, discussions with consultants, evaluation of patient's response to treatment, examination of patient, obtaining history from patient or surrogate, ordering and performing treatments and interventions, ordering and review of laboratory studies, ordering and review of radiographic studies, pulse oximetry and re-evaluation of patient's condition.  Alona Bene, MD Emergency Medicine  ____________________________________________   INITIAL IMPRESSION / ASSESSMENT AND PLAN / ED COURSE  Pertinent labs & imaging results that were available during my care of the patient were reviewed by me and considered in my medical decision making (see chart for details).   This patient is Presenting for Evaluation of LE pain and abdominal pain, which does require a range of treatment options, and is a complaint that involves a high risk of morbidity and mortality.  The Differential Diagnoses include neuropathy, limb ischemia, fracture, etc.  Critical Interventions-    Medications  polyethylene glycol (MIRALAX / GLYCOLAX) packet 17 g (17 g Oral Patient Refused/Not Given 07/16/23 0942)  senna-docusate (Senokot-S) tablet 1 tablet (1 tablet Oral Patient Refused/Not Given 07/16/23 0940)  albuterol (PROVENTIL) (2.5 MG/3ML) 0.083% nebulizer solution 2.5 mg (has no administration in time range)  arformoterol (BROVANA) nebulizer solution 15 mcg (15 mcg Nebulization Given 07/16/23 0921)  citalopram (CELEXA) tablet 20 mg (20 mg Oral Given 07/16/23 0941)  clonazePAM (KLONOPIN) tablet 1 mg (1 mg Oral Given 07/16/23 0004)  dapagliflozin propanediol (FARXIGA) tablet 5 mg (5 mg Oral Given 07/16/23 0941)  oxyCODONE (Oxy IR/ROXICODONE) immediate release tablet 10 mg (10 mg Oral Given 07/16/23 1226)  pantoprazole (PROTONIX) EC tablet 40 mg (40 mg Oral Given 07/16/23 0940)  spironolactone (ALDACTONE) tablet 25 mg (25 mg Oral Given 07/16/23 0941)   enoxaparin (LOVENOX) injection 70 mg (has no administration in time range)  scopolamine (TRANSDERM-SCOP) 1 MG/3DAYS 1.5 mg (1.5 mg Transdermal Patient Refused/Not Given 07/15/23 2100)  metoprolol succinate (TOPROL-XL) 24 hr tablet 25 mg (has no administration in time range)  pramipexole (MIRAPEX) tablet 0.5 mg (0.5 mg Oral Given 07/16/23 1226)  ondansetron (ZOFRAN-ODT) disintegrating tablet 4 mg (has no administration in time range)  gabapentin (NEURONTIN) capsule 300 mg (300 mg Oral Given 07/15/23 0936)  potassium chloride 10 mEq in 100 mL IVPB (0 mEq Intravenous Stopped 07/15/23 1655)  potassium chloride SA (KLOR-CON M) CR tablet 60 mEq (60 mEq Oral Given 07/15/23 1646)  magnesium sulfate IVPB 2 g 50 mL (2 g Intravenous New Bag/Given 07/15/23 2118)  potassium chloride SA (KLOR-CON M) CR tablet 40 mEq (40 mEq Oral Given 07/15/23 2116)  potassium chloride 10 mEq in 100 mL IVPB (10 mEq Intravenous New Bag/Given 07/16/23 0324)  potassium chloride 10 mEq in 100 mL IVPB (10  mEq Intravenous New Bag/Given 07/16/23 0439)  magnesium sulfate IVPB 1 g 100 mL (1 g Intravenous New Bag/Given 07/16/23 0550)  potassium chloride SA (KLOR-CON M) CR tablet 60 mEq (60 mEq Oral Given 07/16/23 0441)  cyanocobalamin (VITAMIN B12) injection 1,000 mcg (1,000 mcg Intramuscular Given 07/16/23 1225)    Reassessment after intervention:  K improving.     Clinical Laboratory Tests Ordered, included   Radiologic Tests Ordered, included abdominal CT. I independently interpreted the images and agree with radiology interpretation.   Cardiac Monitor Tracing which shows NSR.    Social Determinants of Health Risk patient is a non-smoker.   Consult complete with medicine team. Plan for admit for hypokalemia and replacement.   Medical Decision Making: Summary:  Patient presents emergency department for the ration of abdominal pain with constipation and burning pain in the bilateral lower extremities.  She has briskly palpable pulses  in the [name].  Pain in the [name] seems most consistent with neuropathy.  She has only recently been started on a gabapentin at a dose of 300 mg daily.  Plan to give a second dose of medication this morning.  Patient also having some more diffuse abdominal fullness and discomfort in the setting of constipation.  Plan for noncontrast CT abdomen pelvis for further assessment but no peritonitis on exam.   Reevaluation with update and discussion with patient. K is low. Will need admit for IV potassium and better pain control from neuropathy.   Patient's presentation is most consistent with acute presentation with potential threat to life or bodily function.   Disposition: admit  ____________________________________________  FINAL CLINICAL IMPRESSION(S) / ED DIAGNOSES  Final diagnoses:  Hypokalemia    Note:  This document was prepared using Dragon voice recognition software and may include unintentional dictation errors.  Alona Bene, MD, [facility] Emergency Medicine    Maria [name], Maria Repress, MD 07/16/23 1248

## 2023-07-15 NOTE — Progress Notes (Addendum)
11:20am: CSW spoke with Kandee Keen of [facility] who states the agency cannot accept patient for home health services.  CSW spoke with Amy at [facility] who states the agency cannot accept patient for home health services.  CSW spoke with Tresa Endo at [facility] who states the agency cannot accept patient back for services.  10am: CSW received consult for patient to assist with home health needs.  Per PING, patient was active with [facility] from August 2023 through December 2023.  CSW spoke with Tresa Endo at [facility] who states she will speak with intake staff to determine if the agency can accept patient back for services.  Edwin Dada, MSW, LCSW Transitions of Care  Clinical Social Worker II [phone]

## 2023-07-15 NOTE — Assessment & Plan Note (Signed)
Generalized abdominal pain. Most likely secondary to constipation; CT AP without acute abnormality, but did show some stool burden. Last BM 7/21. Also chronic opioid user. Labs significant for Tbili 1.9. AST, ALT, Lipase WNL. Does endorse some nausea, has zofran 4mg  at home. - RUQ [place] due to increased bilirubin to rule out hepatobiliary etiology - AM hepatic panel - will add on zofran once EKG results to monitor for Qtc prolongation - miralax BID, Senna 1 tab BID

## 2023-07-15 NOTE — Assessment & Plan Note (Addendum)
Improving today.  This a.m. K+ 3.2, repleted this AM.  CT head negative.  B12 158.  CRP 1.0.  ESR 18.  CK 33. CRP and ESR not concerning for inflammatory process.  CK not concerning for muscle breakdown contributing to clinical picture.  EKG WNL. -Recheck BMP at 1200 - PT/OT to treat, appreciate recs regarding discharge home vs SNF for rehabilitation -Order B12 IM once, then start oral B12 supplementation -Discontinue Lopressor and start Toprol-XL 25 mg once daily - VTE prophylaxis with Lovenox - AM BMP  -Continue fall precautions -Continue hold gabapentin -Restart home pramipexole for RLS - Continue home oxycodone 10 mg 4 times daily - consider weaning - Continue home Klonopin 1 mg twice daily as needed - Orthostatic vital signs - Repeat blood pressure with manual cuff to confirm continued hypotensive readings - Cardiac monitoring for 24 hrs until K stabilizes

## 2023-07-15 NOTE — ED Triage Notes (Signed)
Pt BIB [facility] from home d/t bil feet/leg pain. Reports she was unable to get off her cough after an hr of trying & was recently Dx with neuropathy this past Thursday (per pt). 112/86, 84 bpm, 93% on RA, 132 CBG, 18 Resp, A/Ox4.

## 2023-07-15 NOTE — Assessment & Plan Note (Addendum)
Echo 02/24 showed LVEF 60 to 65%, with grade 2 diastolic dysfunction, RVSP 47.  Home meds are spironolactone 25 mg, torsemide 80 mg in the morning and 60 mg in the evening, metolazone 5 mg.  Dry weight 346 pounds. - Strict I/Os -Daily weights -Holding diuretics at this time - No repeat echo at this time as most recent was within last 6 months, no evidence of acute exacerbation

## 2023-07-15 NOTE — Hospital Course (Addendum)
Neuropathy pain   Generalized weakness, pain in her legs, unable to gt up at home   Good pulses  Started on gabapentin - only taking a few doses  K 2.5, Cr elevated from baseline - renal insufficiency   Swelling in legs, no signs of infection     Maria [name] is a 67 y.o.female with a history of chronic pain on opioids, anxiety on benzodiazepines, heart failure with preserved ejection fraction, notes Arceo arthritis to bilateral knees and DDD of the back who was admitted to the family medicine teaching Service at [facility] for symptomatic hypokalemia. Her hospital course is detailed below:  Generalized weakness Patient presented with generalized weakness found to have potassium of 2.5, likely in the setting of increased torsemide dosing.  Patient's chronic use of benzodiazepines, opioids, with recent addition of gabapentin may also be a factor. CT head negative. CRP and ESR not concerning for inflammatory process. Patient's potassium was repleted and patient reports improvement during admission.  Hypokalemia With poor p.o. intake.  Patient's potassium was repleted and rose appropriately.  Patient was not started back on home torsemide.  Patient was discharged with potassium supplementation ***.   Abdominal pain Patient reported generalized abdominal pain on exam during admission.  Patient had large bowel movement overnight which alleviated some of the discomfort.    Other chronic conditions were medically managed with home medications and formulary alternatives as necessary (pretension, GERD, depression, chronic pain, DMII, lymphedema, COPD)  PCP Follow-up Recommendations: Consider sleep study to assess for OSA Recheck B12 and consider continuing oral supplementation due to very low levels in hospital

## 2023-07-16 ENCOUNTER — Observation Stay (HOSPITAL_COMMUNITY): Payer: 59

## 2023-07-16 DIAGNOSIS — R1030 Lower abdominal pain, unspecified: Secondary | ICD-10-CM | POA: Diagnosis not present

## 2023-07-16 DIAGNOSIS — I5030 Unspecified diastolic (congestive) heart failure: Secondary | ICD-10-CM | POA: Diagnosis not present

## 2023-07-16 DIAGNOSIS — R531 Weakness: Secondary | ICD-10-CM | POA: Diagnosis not present

## 2023-07-16 DIAGNOSIS — E876 Hypokalemia: Secondary | ICD-10-CM | POA: Diagnosis not present

## 2023-07-16 LAB — BASIC METABOLIC PANEL
Anion gap: 9 (ref 5–15)
BUN: 13 mg/dL (ref 8–23)
CO2: 35 mmol/L — ABNORMAL HIGH (ref 22–32)
Calcium: 8.8 mg/dL — ABNORMAL LOW (ref 8.9–10.3)
Chloride: 93 mmol/L — ABNORMAL LOW (ref 98–111)
Creatinine, Ser: 1.19 mg/dL — ABNORMAL HIGH (ref 0.44–1.00)
GFR, Estimated: 50 mL/min — ABNORMAL LOW (ref >60)
Glucose, Bld: 108 mg/dL — ABNORMAL HIGH (ref 70–99)
Potassium: 3.5 mmol/L (ref 3.5–5.1)
Sodium: 137 mmol/L (ref 135–145)

## 2023-07-16 LAB — GLUCOSE, CAPILLARY
Glucose-Capillary: 115 mg/dL — ABNORMAL HIGH (ref 70–99)
Glucose-Capillary: 126 mg/dL — ABNORMAL HIGH (ref 70–99)

## 2023-07-16 LAB — MAGNESIUM: Magnesium: 2.2 mg/dL (ref 1.7–2.4)

## 2023-07-16 LAB — CBC: RDW: 13.8 % (ref 11.5–15.5)

## 2023-07-16 MED ORDER — POTASSIUM CHLORIDE CRYS ER 20 MEQ PO TBCR
40.0000 meq | EXTENDED_RELEASE_TABLET | Freq: Two times a day (BID) | ORAL | Status: DC
  Administered 2023-07-16 – 2023-07-17 (×3): 40 meq via ORAL
  Filled 2023-07-16 (×3): qty 2

## 2023-07-16 MED ORDER — POTASSIUM CHLORIDE 10 MEQ/100ML IV SOLN
10.0000 meq | Freq: Once | INTRAVENOUS | Status: AC
  Administered 2023-07-16: 10 meq via INTRAVENOUS
  Filled 2023-07-16: qty 100

## 2023-07-16 MED ORDER — METOPROLOL SUCCINATE ER 25 MG PO TB24
25.0000 mg | ORAL_TABLET | Freq: Every day | ORAL | Status: DC
  Administered 2023-07-17: 25 mg via ORAL
  Filled 2023-07-16 (×2): qty 1

## 2023-07-16 MED ORDER — CYANOCOBALAMIN 1000 MCG/ML IJ SOLN
1000.0000 ug | Freq: Once | INTRAMUSCULAR | Status: AC
  Administered 2023-07-16: 1000 ug via INTRAMUSCULAR
  Filled 2023-07-16: qty 1

## 2023-07-16 MED ORDER — MAGNESIUM SULFATE IN D5W 1-5 GM/100ML-% IV SOLN
1.0000 g | Freq: Once | INTRAVENOUS | Status: AC
  Administered 2023-07-16: 1 g via INTRAVENOUS
  Filled 2023-07-16: qty 100

## 2023-07-16 MED ORDER — POTASSIUM CHLORIDE CRYS ER 20 MEQ PO TBCR
60.0000 meq | EXTENDED_RELEASE_TABLET | Freq: Once | ORAL | Status: AC
  Administered 2023-07-16: 60 meq via ORAL
  Filled 2023-07-16: qty 3

## 2023-07-16 MED ORDER — PRAMIPEXOLE DIHYDROCHLORIDE 0.25 MG PO TABS
0.5000 mg | ORAL_TABLET | Freq: Three times a day (TID) | ORAL | Status: DC
  Administered 2023-07-16 – 2023-07-17 (×5): 0.5 mg via ORAL
  Filled 2023-07-16 (×5): qty 2

## 2023-07-16 MED ORDER — ONDANSETRON 4 MG PO TBDP: 4.0000 mg | ORAL_TABLET | Freq: Three times a day (TID) | ORAL | Status: DC | PRN

## 2023-07-16 NOTE — Progress Notes (Signed)
Daily Progress Note Intern Pager: [phone]  Patient name: Maria [name] Medical record number: [id number] Date of birth: Aug 06, 1956 Age: 67 y.o. Gender: female  Primary Care Provider: Ignatius Specking, NP Consultants: none Code Status: FULL  Pt Overview and Major Events to Date:  7/26: Admitted to FMTS  Assessment and Plan: Maria [name] is a 67 y.o. female presenting with generalized weakness.   Patient is likely a good candidate for SNF pending PT evaluation.  -      Hospital     * (Principal) General weakness     Improving today.  This a.m. K+ 3.2, repleted this AM.  CT head  negative.  B12 158.  CRP 1.0.  ESR 18.  CK 33. CRP and ESR not concerning  for inflammatory process.  CK not concerning for muscle breakdown  contributing to clinical picture.  EKG WNL. -Recheck BMP at 1200 - PT/OT to treat, appreciate recs regarding discharge home vs SNF for  rehabilitation -Order B12 IM once, then start oral B12 supplementation -Discontinue Lopressor and start Toprol-XL 25 mg once daily - VTE prophylaxis with Lovenox - AM BMP  -Continue fall precautions -Continue hold gabapentin -Restart home pramipexole for RLS - Continue home oxycodone 10 mg 4 times daily - consider weaning - Continue home Klonopin 1 mg twice daily as needed - Orthostatic vital signs - Repeat blood pressure with manual cuff to confirm continued hypotensive  readings - Cardiac monitoring for 24 hrs until K stabilizes         Hypokalemia     K+ 3.2, repleted this a.m.  TSH 1.60.  EKG WNL. -Recheck BMP 1200 - AM BMP tomorrow -Continue to hold torsemide at this time -Continue spironolactone 25 mg - consider adding back home Klor-Con BID today         (HFpEF) heart failure with preserved ejection fraction (HCC)     Dry weight 346 pounds.  Weight this morning 327 pounds, 2 pound  increase from yesterday in the ED.  Patient has output 250 mL in the last  24 hours. This weight gain is likely  due to different scales. - Strict I/Os -Daily weights -Holding diuretics at this time        Abdominal pain     Currently mild epigastric abdominal pain this morning, improved from  generalized abdominal pain on exam yesterday.  Patient did have large  bowel movement last night which may have alleviated some constipation  contributing to this discomfort.  Also had 1 episode of vomiting.  RUQ US  shows fatty liver and 5 mm gallbladder polyp, report noted that polyps  under 6 mm are usually benign and do not require follow-up.  Hepatic panel  showed total protein 6.3, indirect bilirubin 1.6, total bilirubin 1.8. -EKG WNL.  Add back Zofran 4 mg as needed. - miralax BID, Senna 1 tab BID        AKI (acute kidney injury) (HCC)     Cr improving, 1.34 this AM, baseline around 1.1 - Continue home farxiga, monitor for worsening kidney function     Chronic and Stable Conditions: HTN - continue Lopressor 25mg  daily GERD - continue protonix 40mg  daily Anxiety, Depression - continue celexa 20mg , klonopin 1mg  BID PRN Chronic pain - continue oxycodone 10mg  4 times daily T2DM - continue farxiga 5mg ; discontinue CBG checks Lymphedema - TED hose COPD - continue albuterol inhaler 2 puffs q6h prn; Brovana nebulizer BID Restless Leg Syndrome - holding pramipexole  FEN/GI:  Regular diet PPx: Lovenox Dispo:Home today-tomorrow. Barriers include PT mobility assessment.   Subjective:  Patient is seen this morning lying in bed.  She reports she is very uncomfortable with pain to her back and hips.  She has been getting as needed oxycodone.  She reports she had a large bowel movement unexpectedly yesterday evening, afterwards she had 1 episode of vomiting.  Since then she has not had any bowel trouble.  Her appetite was normal this morning.  She is willing to consider SNF for rehabilitation pending PT recommendations.  Objective: Temp:  [97.5 F (36.4 C)-98.3 F (36.8 C)] 97.9 F (36.6 C) (07/26  0745) Pulse Rate:  [67-74] 72 (07/26 0745) Resp:  [18-24] 18 (07/26 0410) BP: (94-142)/(43-62) 110/49 (07/26 0745) SpO2:  [93 %-98 %] 95 % (07/26 0921) Weight:  [147.6 kg-148.6 kg] 148.6 kg (07/26 0408) Physical Exam: General: Peers uncomfortable, no acute distress Cardiovascular: RRR, no murmurs Respiratory: CTA bilaterally, no increased work of breathing, on room air. Abdomen: Normoactive bowel sounds, soft, mildly tender in the epigastric region Extremities: Moves all extremities equally.  Lower extremities Ace bandages.  Difficult to determine level of edema present. Psych: Cognition and judgment intact, alert and communicative. Neuro: UE and LE strength 5/5 today, improved from prior exam.  Normal and equal sensation intact in all extremities.  Laboratory: Most recent CBC Lab Results  Component Value Date   WBC 9.1 07/16/2023   HGB 11.8 (L) 07/16/2023   HCT 35.4 (L) 07/16/2023   MCV 88.3 07/16/2023   PLT 256 07/16/2023   Most recent BMP    Latest Ref Rng & Units 07/16/2023    2:12 AM  BMP  Glucose 70 - 99 mg/dL 440   BUN 8 - 23 mg/dL 18   Creatinine 3.47 - 1.00 mg/dL 4.25   Sodium 956 - 387 mmol/L 137   Potassium 3.5 - 5.1 mmol/L 3.2   Chloride 98 - 111 mmol/L 90   CO2 22 - 32 mmol/L 37   Calcium 8.9 - 10.3 mg/dL 8.8    Magnesium 1.9 Hgb A1c 6.0 ESR 18 Vitamin B12 158 CK 33 TSH 1.620 CRP 1.0  Imaging/Diagnostic Tests: 07/16/23 RUQ [place]: Fatty liver. 5 mm gallbladder polyp. Per consensus statement, gallbladder polyps less than 6 mm are usually benign not requiring follow-up.  Cyndia Skeeters, DO 07/16/2023, 11:34 AM  PGY-1, [facility] Family Medicine FPTS Intern pager: [phone], text pages welcome Secure chat group [facility] Teaching Service

## 2023-07-16 NOTE — Assessment & Plan Note (Signed)
Dry weight 346 pounds.  Weight this morning 327 pounds, 2 pound increase from yesterday in the ED.  Patient has output 250 mL in the last 24 hours. This weight gain is likely due to different scales. - Strict I/Os -Daily weights -Holding diuretics at this time

## 2023-07-16 NOTE — Progress Notes (Addendum)
Transition of Care [facility]) - Inpatient Brief Assessment   Patient Details  Name: Maria [name] MRN: [id number] Date of Birth: 1956-07-12  Transition of Care [facility]) CM/SW Contact:    Janae Bridgeman, RN Phone Number: 07/16/2023, 12:12 PM   Clinical Narrative: CM met with the patient at the bedside and patient was admitted for generalized weakness and hypokalemia.  She lives alone and has Rolator, RW and Glucometer at the home.  [facility] HH is following the patient for PT/MSW/ aide for home.  Home health orders modified since [facility] agency is unable to provide RN due to staffing.  Patient was provided with Medicare Observation letter.  The patient plans to call her niece to assist with transportation home by car when she is medically stable to discharge.   Transition of Care Asessment: Insurance and Status: (P) Insurance coverage has been reviewed Patient has primary care physician: (P) Yes Home environment has been reviewed: (P) Yes - lives at home alone Prior level of function:: (P) Independent with IT consultant Home Services: (P) No current home services Social Determinants of Health Reivew: (P) SDOH reviewed interventions complete Readmission risk has been reviewed: (P) Yes Transition of care needs: (P) no transition of care needs at this time

## 2023-07-16 NOTE — Care Management Obs Status (Cosign Needed)
MEDICARE OBSERVATION STATUS NOTIFICATION   Patient Details  Name: Maria [name] MRN: [id number] Date of Birth: December 29, 1955   Medicare Observation Status Notification Given:  Yes    Janae Bridgeman, RN 07/16/2023, 11:53 AM

## 2023-07-16 NOTE — Plan of Care (Signed)

## 2023-07-16 NOTE — Assessment & Plan Note (Signed)
Cr improving, 1.34 this AM, baseline around 1.1 - Continue home farxiga, monitor for worsening kidney function
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
## 2023-07-16 NOTE — Assessment & Plan Note (Addendum)
K+ 3.2, repleted this a.m.  TSH 1.60.  EKG WNL. -Recheck BMP 1200 - AM BMP tomorrow -Continue to hold torsemide at this time -Continue spironolactone 25 mg - consider adding back home Klor-Con BID today

## 2023-07-16 NOTE — Progress Notes (Signed)
OT Cancellation Note  Patient Details Name: Maria [name] MRN: [id number] DOB: [birth year]   Cancelled Treatment:    Reason Eval/Treat Not Completed: OT screened, no needs identified, will sign off (Per PT, pt is at her baseline. Please re-consult if change in status.)  Maria [name], OTR/L [facility] Acute Rehabilitation Office: [phone]   Maria [name] 07/16/2023, 11:03 AM

## 2023-07-16 NOTE — Assessment & Plan Note (Signed)
Value 158. Given 1 time B12 IM. - Continue PO supplementation - Repeat B12 labs in 6 weeks

## 2023-07-16 NOTE — Assessment & Plan Note (Addendum)
Currently mild epigastric abdominal pain this morning, improved from generalized abdominal pain on exam yesterday.  Patient did have large bowel movement last night which may have alleviated some constipation contributing to this discomfort.  Also had 1 episode of vomiting.  RUQ US shows fatty liver and 5 mm gallbladder polyp, report noted that polyps under 6 mm are usually benign and do not require follow-up.  Hepatic panel showed total protein 6.3, indirect bilirubin 1.6, total bilirubin 1.8. -EKG WNL.  Add back Zofran 4 mg as needed. - miralax BID, Senna 1 tab BID

## 2023-07-17 ENCOUNTER — Other Ambulatory Visit (HOSPITAL_COMMUNITY): Payer: Self-pay

## 2023-07-17 DIAGNOSIS — R531 Weakness: Secondary | ICD-10-CM

## 2023-07-17 LAB — GLUCOSE, CAPILLARY: Glucose-Capillary: 90 mg/dL (ref 70–99)

## 2023-07-17 MED ORDER — POTASSIUM CHLORIDE CRYS ER 20 MEQ PO TBCR
40.0000 meq | EXTENDED_RELEASE_TABLET | Freq: Once | ORAL | Status: AC
  Administered 2023-07-17: 40 meq via ORAL
  Filled 2023-07-17: qty 2

## 2023-07-17 MED ORDER — METOPROLOL SUCCINATE ER 25 MG PO TB24
25.0000 mg | ORAL_TABLET | Freq: Every day | ORAL | 0 refills | Status: AC
  Filled 2023-07-17: qty 30, 30d supply, fill #0

## 2023-07-17 MED ORDER — TORSEMIDE 20 MG PO TABS
40.0000 mg | ORAL_TABLET | Freq: Every day | ORAL | 3 refills | Status: DC
Start: 2023-07-17 — End: 2023-10-11
  Filled 2023-07-17: qty 540, 270d supply, fill #0
  Filled 2023-07-17: qty 200, 100d supply, fill #0

## 2023-07-17 MED ORDER — HYDROMORPHONE HCL 1 MG/ML IJ SOLN
0.5000 mg | Freq: Once | INTRAMUSCULAR | Status: AC
  Administered 2023-07-17: 0.5 mg via INTRAVENOUS
  Filled 2023-07-17: qty 0.5

## 2023-07-17 MED ORDER — VITAMIN B-12 1000 MCG PO TABS
1000.0000 ug | ORAL_TABLET | Freq: Every day | ORAL | Status: DC
  Administered 2023-07-17: 1000 ug via ORAL
  Filled 2023-07-17: qty 1

## 2023-07-17 MED ORDER — CYANOCOBALAMIN 1000 MCG PO TABS
1000.0000 ug | ORAL_TABLET | Freq: Every day | ORAL | 0 refills | Status: AC
  Filled 2023-07-17: qty 30, 30d supply, fill #0

## 2023-07-17 MED ORDER — POTASSIUM CHLORIDE CRYS ER 20 MEQ PO TBCR
40.0000 meq | EXTENDED_RELEASE_TABLET | Freq: Two times a day (BID) | ORAL | Status: DC
Start: 2023-07-17 — End: 2023-11-25

## 2023-07-17 NOTE — TOC Transition Note (Addendum)
Transition of Care [facility]) - CM/SW Discharge Note   Patient Details  Name: Maria [name] MRN: [id number] Date of Birth: 07/09/1956  Transition of Care [facility]) CM/SW Contact:  Lawerance Sabal, RN Phone Number: 07/17/2023, 10:31 AM   Clinical Narrative:     Requested by MD to assess for transportation assistance. Spoke w patient over the phone. She states that she will call family and friends to set up transportation later today around dinner time.  She states that staff is currently looking for her RW she brought from home.  I have notified Frances Furbish that we anticipate DC later today.   Followed up with patient and she has found a ride, but not her RW, I have ordered a new one to be delivered to her room before she goes through [employer]   Final next level of care: Home w Home Health Services Barriers to Discharge: No Barriers Identified   Patient Goals and CMS Choice CMS Medicare.gov Compare Post Acute Care list provided to:: Patient Choice offered to / list presented to : Patient  Discharge Placement                         Discharge Plan and Services Additional resources added to the After Visit Summary for                              [facility] Agency: [facility] Date [facility] Agency Contacted: 07/17/23 Time HH Agency Contacted: 1031 Representative spoke with at [facility] Agency: Kandee Keen  Social Determinants of Health (SDOH) Interventions SDOH Screenings   Food Insecurity: No Food Insecurity (07/15/2023)  Housing: Low Risk  (07/15/2023)  Transportation Needs: No Transportation Needs (07/15/2023)  Utilities: Not At Risk (07/15/2023)  Alcohol Screen: Low Risk  (12/30/2021)  Depression (PHQ2-9): Medium Risk (01/30/2022)  Financial Resource Strain: Medium Risk (12/30/2021)  Physical Activity: Inactive (12/30/2021)  Social Connections: Unknown (09/17/2022)   Received from [facility], [facility]  Stress: No Stress Concern Present (12/30/2021)  Tobacco Use: Medium Risk  (07/15/2023)     Readmission Risk Interventions    07/22/2022    2:59 PM 04/11/2021   11:40 AM  Readmission Risk Prevention Plan  Transportation Screening Complete Complete  PCP or Specialist Appt within 3-5 Days Complete Complete  HRI or Home Care Consult Complete Complete  Social Work Consult for Recovery Care Planning/Counseling Complete Complete  Palliative Care Screening Complete Not Applicable  Medication Review Ocean[employer]) Complete Complete

## 2023-07-17 NOTE — Assessment & Plan Note (Signed)
Dry weight per chart review 346 pounds.  Weight this morning 326 pounds. - Strict I/Os -Daily weights -Holding diuretics at this time

## 2023-07-17 NOTE — Assessment & Plan Note (Addendum)
Improving today.  Hypokalemia resolved. Patient has worked with PT successfully and is not recommended for SNF; noted to be at functional baseline. Manual BP this AM 126/62. - PT/OT to treat, appreciate recs - Continue Toprol-XL 25 mg once daily - AM BMP if patient remains until tomorrow -Continue fall precautions -Continue hold gabapentin -Restart home pramipexole for RLS - Continue home oxycodone 10 mg 4 times daily - consider weaning - Continue home Klonopin 1 mg twice daily as needed

## 2023-07-17 NOTE — Progress Notes (Signed)
FMTS Interim Progress Note  S:Messaged by RN patient crying from back pain. Went to assess patient at bedside with Dr. Georg Ruddle. On arrival she is laying in bed intermittently groaning in pain. When we approach she is tearful stating her lower back pain is bothering her. States that she was NPO for RUQ u/s and is now off her pain regimen schedule and usually needs something to catch up on her chronic pain when this happens. Dilaudid has been helpful for her in the past.   O: BP (!) 116/58 (BP Location: Right Arm)   Pulse 72   Temp 97.8 F (36.6 C)   Resp 18   Ht 5\' 3"  (1.6 m)   Wt (!) 148.6 kg   SpO2 94%   BMI 58.03 kg/m    Gen: Tearful, alert and oriented to person, place, year, sitaution Resp: bilateral chest rise, no iWOB   A/P: Chronic back pain Did review her medication history and did not receive oxycodone on 7/25.  Currently in pain from her lower back.  Reasonable due to one-time dose of IV Dilaudid and remain on her home oxycodone after this.  Levin Erp, MD 07/17/2023, 12:19 AM PGY-3, [facility] Family Medicine Service pager [phone]

## 2023-07-17 NOTE — Discharge Summary (Addendum)
Family Medicine Teaching [facility] Discharge Summary  Patient name: Maria [name] Medical record number: [id number] Date of birth: Jan 13, 1956 Age: 67 y.o. 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   Her hospital course is detailed below:  Generalized weakness Patient presented with generalized weakness found to have potassium of 2.5, likely in the setting of recently increased torsemide dosing.  Patient's chronic use of benzodiazepines, opioids, with recent addition of gabapentin may also be a factor. CT head negative. CRP and ESR not concerning for inflammatory process. Patient's potassium was repleted and patient reports improvement during admission.  She was assessed by PT and found to be at baseline functional status.  No recommendation for SNF.  Hypokalemia With poor p.o. intake and recent increase in torsemide dosing.  Patient's potassium was repleted and rose appropriately.  At discharge patient was started on torsemide 40 mg once daily (down from 80mg  BID on admission).  Home metolazone was discontinued due to concerns of hypokalemia.  Patient was discharged with potassium supplementation 40 mEq 2 times daily.   Abdominal pain Patient reported generalized abdominal pain on exam during admission.  Patient had large bowel movement overnight which alleviated some of the discomfort.  She was also noted to have a large umbilical hernia which she has had for  many years.   Other chronic conditions were medically managed with home medications and formulary alternatives as necessary: HTN - Lopressor 25mg  daily GERD - protonix 40mg  daily Anxiety, Depression - celexa 20mg , klonopin 1mg  BID PRN Chronic pain - oxycodone 10mg  4 times daily Lymphedema - TED hose COPD - albuterol inhaler 2 puffs q6h prn; Brovana nebulizer BID Restless Leg Syndrome - pramipexole 0.5mg   PCP Follow-up Recommendations: Consider sleep study to assess for OSA Repeat BMP and titrate potassium supplementation as appropriate  We transitioned her from metoprolol tartrate 25mg  once daily to succinate 25mg  once daily.  Vit B12 level was 158 pg/mL here. We gave her a one time B12 injection of . Recommend repeat vit B12 in 6 weeks, we are discharging her on oral supplementation daily.  Assess umbilical hernia and consider referral to surgery   Discharge Diagnoses/Problem List:  Principal Problem:   General weakness Active Problems:   Hypokalemia   AKI (acute kidney injury) (HCC)   B12 deficiency   (HFpEF) heart failure with preserved ejection fraction (HCC)   Abdominal pain  Disposition: home  Discharge Condition: stable  Discharge Exam:  General: Pleasant, no acute distress, briefly tearful Cardiovascular: RRR, no murmurs Respiratory: CTA bilaterally, on room air Abdomen: Soft, nondistended, with large palpable bulge in the midline abdomen when patient is supine and lifts head.  Mildly tender in this area.  Normoactive bowel sounds. Extremities: Moves all extremities equally.  Difficult to determine level of edema present, but appears improved from prior exams.  Scaling skin bilateral LEs. Neuro: UE and LE strength 5/5 today.  Normal and equal sensation intact in all extremities.  Significant Procedures: none  Significant Labs and Imaging:  Recent Labs  Lab 07/16/23 0212  WBC 9.1  HGB 11.8*  HCT 35.4*  PLT 256   Recent Labs  Lab  07/15/23 1812 07/16/23 0212 07/16/23 1206 07/17/23 0157  NA 132* 137 137 136  K 2.4* 3.2* 3.5 3.5  CL 86* 90* 93* 95*  CO2 35* 37* 35* 29  GLUCOSE 107* 126* 108* 90  BUN 20 18 13 12   CREATININE 1.47* 1.34* 1.19* 1.30*  CALCIUM 8.4* 8.8* 8.8* 8.9  MG  --  1.9 2.2 2.0  ALKPHOS  --  63  --   --   AST  --  17  --   --   ALT  --  13  --   --   ALBUMIN  --  3.1*  --   --     Results/Tests Pending at Time of Discharge: none  Discharge Medications:  Allergies as of 07/17/2023       Reactions   Amoxicillin-pot Clavulanate Nausea And Vomiting   Projectile vomiting Did it involve swelling of the face/tongue/throat, SOB, or low BP? No Did it involve sudden or severe rash/hives, skin peeling, or any reaction on the inside of your mouth or nose? No Did you need to seek medical attention at a hospital or doctor's office? No When did it last happen?      20-30 years If all above answers are "NO", may proceed with cephalosporin use.   Adhesive [tape] Other (See Comments)   Tears skin off - use paper tape    Codeine Other (See Comments)   hallucinations   Crestor [rosuvastatin Calcium]    Severe muscle cramps   Lyrica [pregabalin] Other (See Comments)   weakness   Morphine And Codeine Nausea And Vomiting, Other (See Comments)   Migraine headaches   Vicodin [hydrocodone-acetaminophen] Other (See Comments)   hallucinations   Latex Rash        Medication List     STOP taking these medications    dextromethorphan-guaiFENesin 30-600 MG 12hr tablet Commonly known as: MUCINEX DM   Farxiga 5 MG Tabs tablet Generic drug: dapagliflozin propanediol   metolazone 5 MG tablet Commonly known as: ZAROXOLYN   metoprolol tartrate 25 MG tablet Commonly known as: LOPRESSOR   Polyethyl Glycol-Propyl Glycol 0.4-0.3 % Soln       TAKE these medications    Accu-Chek Guide test strip Generic drug: glucose blood Use as instructed up to 4 times daily   Accu-Chek Guide w/Device Kit Use  as directed What changed:  how much to take how to take this when to take this additional instructions   albuterol 108 (90 Base) MCG/ACT inhaler Commonly known as: ProAir HFA Inhale 2 puffs into the lungs every 6 (six) hours as needed for wheezing or shortness of breath.   arformoterol 15 MCG/2ML Nebu Commonly known as: BROVANA Take 2 mLs (15 mcg total) by nebulization 2 (two) times daily.   cholecalciferol 25 MCG (1000 UNIT) tablet Commonly known as: VITAMIN D3 Take 2,000 Units by mouth daily.   citalopram 20 MG tablet Commonly known as: CELEXA Take 1 tablet (20 mg total) by mouth daily.   clonazePAM 1 MG tablet Commonly known as: KLONOPIN TAKE ONE TABLET BY MOUTH TWICE DAILY AS NEEDED FOR ANXIETY What changed: reasons to take this   clotrimazole 1 % cream Commonly known as: LOTRIMIN Apply topically 2 (two) times daily.   cyanocobalamin 1000 MCG tablet Take 1 tablet (1,000 mcg total) by mouth daily. Start taking on: July 18, 2023   fluticasone 50 MCG/ACT nasal spray Commonly  known as: FLONASE Place 1 spray into both nostrils daily.   ipratropium-albuterol 0.5-2.5 (3) MG/3ML Soln Commonly known as: DUONEB Inhale 3 mLs into the lungs every 6 (six) hours for 5 days.   Metamucil Fiber Chew Chew 3 tablets by mouth at bedtime.   metoprolol succinate 25 MG 24 hr tablet Commonly known as: TOPROL-XL Take 1 tablet (25 mg total) by mouth daily. Start taking on: July 18, 2023   Mounjaro 10 MG/0.5ML Pen Generic drug: tirzepatide Inject 10 mg into the skin once a week.   Narcan 4 MG/0.1ML Liqd nasal spray kit Generic drug: naloxone Place 0.4 mg into the nose daily as needed (opioid overdose).   ondansetron 4 MG disintegrating tablet Commonly known as: Zofran ODT Take 1 tablet (4 mg total) by mouth every 4 (four) hours as needed for nausea or vomiting.   Oxycodone HCl 10 MG Tabs Take 1 tablet (10 mg total) 4 (four) times daily by mouth. Per [facility] What changed:  when to take this reasons to take this   pantoprazole 40 MG tablet Commonly known as: PROTONIX TAKE ONE TABLET BY MOUTH DAILY   potassium chloride SA 20 MEQ tablet Commonly known as: KLOR-CON M Take 2 tablets (40 mEq total) by mouth 2 (two) times daily.   pramipexole 0.5 MG tablet Commonly known as: MIRAPEX Take 1 tablet (0.5 mg total) by mouth 3 (three) times daily.   senna-docusate 8.6-50 MG tablet Commonly known as: Senokot-S Take 1 tablet by mouth at bedtime as needed for mild constipation.   spironolactone 25 MG tablet Commonly known as: ALDACTONE Take 0.5 tablets (12.5 mg total) by mouth daily. What changed: how much to take   torsemide 20 MG tablet Commonly known as: DEMADEX Take 2 tablets (40 mg total) by mouth daily. What changed:  how much to take when to take this        Discharge Instructions: Please refer to Patient Instructions section of EMR for full details.  Patient was counseled important signs and symptoms that should prompt return to medical care, changes in medications, dietary instructions, activity restrictions, and follow up appointments.   Follow-Up Appointments:  Follow-up Information     [facility] Follow up.   Specialty: Home Health Services Why: [facility] will be providing home health services  including PT and an aide.  They will call you in the next 24-48 hours to set up services. Contact information: [address] [place] [address] [phone]                 Cyndia Skeeters, DO 07/17/2023, 12:56 PM PGY-1, [facility] Family Medicine    I have evaluated this patient along with Dr. Mliss Sax and reviewed the above note, making necessary revisions.  Dorothyann Gibbs, MD 07/17/2023, 12:59 PM PGY-3, [facility] Family Medicine

## 2023-07-17 NOTE — Progress Notes (Addendum)
FMTS Attending Daily Note: Terisa Starr, MD  Team Pager [phone] Pager [phone]  I have personally seen and examined this patient. I have reviewed their chart. I have discussed this patient with the resident physician.  Addendums to below note include: Recommend further evaluation of normocytic anemia as outpatient  HTN--Lopressor switched to Toprol XL   I agree with the remainder of the findings, exam, and plan below by the resident physician.    Disposition: home today        Daily Progress Note Intern Pager: [phone]  Patient name: Maria [name] Medical record number: [id number] Date of birth: 07-04-1956 Age: 67 y.o. Gender: female  Primary Care Provider: Ignatius Specking, NP Consultants: none Code Status: FULL  Pt Overview and Major Events to Date:  7/26: Admitted to FMTS  Assessment and Plan: Maria [name] is a 67 y.o. female presenting with generalized weakness. Found to have significant hypokalemia likely due to recent outpatient increase in torsemide dosing.  Patient has been recovering strength during her admission. Has seen PT, per note successful in walking down the hall with RW without assist, at functional baseline overall.  Patient is stable and appropriate for discharge.   [facility]     * (Principal) General weakness     Improving today.  Hypokalemia resolved. Patient has worked with PT  successfully and is not recommended for SNF; noted to be at functional  baseline. Manual BP this AM 126/62. - PT/OT to treat, appreciate recs - Continue Toprol-XL 25 mg once daily - AM BMP if patient remains until tomorrow -Continue fall precautions -Continue hold gabapentin -Restart home pramipexole for RLS - Continue home oxycodone 10 mg 4 times daily - consider weaning - Continue home Klonopin 1 mg twice daily as needed        Hypokalemia     K+ 3.5. Repleted multiple times in hospital and improving. - replete today with K+ - AM BMP tomorrow if  patient remains in hospital at that time - Restart Torsemide 40mg  once daily at discharge - Continue spironolactone 25 mg - continue Klor-Con BID         B12 deficiency     Value 158. Given 1 time B12 IM. - Continue PO supplementation - Repeat B12 labs in 6 weeks         (HFpEF) heart failure with preserved ejection fraction (HCC)     Dry weight per chart review 346 pounds.  Weight this morning 326  pounds. - Strict I/Os -Daily weights -Holding diuretics at this time        Abdominal pain     Currently mild epigastric abdominal pain this morning, patient notes  chronic umbilical hernia and attributes discomfort to this. No further  episodes of diarrhea, vomiting. - Continue Zofran 4 mg as needed. - miralax BID, Senna 1 tab BID        AKI (acute kidney injury) (HCC)     Cr improving, 1.30 this AM, baseline around 1.1 - Continue home farxiga, monitor for worsening kidney function     Chronic and Stable Conditions: HTN - continue Lopressor 25mg  daily GERD - continue protonix 40mg  daily Anxiety, Depression - continue celexa 20mg , klonopin 1mg  BID PRN Chronic pain - continue oxycodone 10mg  4 times daily T2DM - continue farxiga 5mg  Lymphedema - TED hose COPD - continue albuterol inhaler 2 puffs q6h prn; Brovana nebulizer BID Restless Leg Syndrome - restarted pramipexole  FEN/GI: Heart Diet PPx:  Lovenox Dispo:Home today.   Subjective:  Patient seen sitting up bedside this morning eating breakfast she reports significant pain overnight which was treated with Dilaudid and she is feeling much better this morning.  She notes reduced swelling in her legs.  She does complain of stomach which she attributes to a longstanding she discusses working with PT yesterday and reports all that she was able to accomplish, including walking up and down the hallway on her own putting on her own socks.  Objective: Temp:  [97.8 F (36.6 C)-98.6 F (37 C)] 98.6 F (37 C)  (07/27 0824) Pulse Rate:  [66-75] 68 (07/27 1024) Resp:  [18] 18 (07/27 1024) BP: (105-136)/(41-62) 126/62 (07/27 1023) SpO2:  [93 %-98 %] 98 % (07/27 1024) Weight:  [147.9 kg] 147.9 kg (07/27 0548) Physical Exam: General: Pleasant, no acute distress, briefly tearful Cardiovascular: RRR, no murmurs Respiratory: CTA bilaterally, on room air Abdomen: Soft, nondistended, with large palpable bulge in the midline abdomen when patient is supine and lifts head.  Mildly tender in this area.  Normoactive bowel sounds. Extremities: Moves all extremities equally.  Difficult to determine level of edema present, but appears improved from prior exams.  Scaling skin bilateral LEs. Neuro: UE and LE strength 5/5 today.  Normal and equal sensation intact in all extremities.  Laboratory: Most recent CBC Lab Results  Component Value Date   WBC 9.1 07/16/2023   HGB 11.8 (L) 07/16/2023   HCT 35.4 (L) 07/16/2023   MCV 88.3 07/16/2023   PLT 256 07/16/2023   Most recent BMP    Latest Ref Rng & Units 07/17/2023    1:57 AM  BMP  Glucose 70 - 99 mg/dL 90   BUN 8 - 23 mg/dL 12   Creatinine 0.86 - 1.00 mg/dL 5.78   Sodium 469 - 629 mmol/L 136   Potassium 3.5 - 5.1 mmol/L 3.5   Chloride 98 - 111 mmol/L 95   CO2 22 - 32 mmol/L 29   Calcium 8.9 - 10.3 mg/dL 8.9    Cyndia Skeeters, DO 07/17/2023, 11:11 AM  PGY-1, [facility] Family Medicine FPTS Intern pager: [phone], text pages welcome Secure chat group [facility] Teaching Service

## 2023-07-17 NOTE — Assessment & Plan Note (Signed)
Cr improving, 1.30 this AM, baseline around 1.1 - Continue home farxiga, monitor for worsening kidney function

## 2023-07-17 NOTE — Discharge Instructions (Signed)
Dear [name] A Buell,   Thank you for letting us participate in your care! In this section, you will find a brief hospital admission summary of why you were admitted to the hospital, what happened during your admission, your diagnosis/diagnoses, and recommended follow up.  Primary diagnosis: Hypokalemia (Low Potassium) Treatment plan: Your potassium was repleted while you were in the hospital. You should also continue to take your prescribed potassium supplement when you go home. We adjusted or discontinued some of your medications which may have been affecting you potassium levels. Specifically, we changes your Torsemide to 40mg  once daily. We stopped your Metolazone - do not take this medication without talking to your doctor. Please take all of you medications as prescribed. If you are unsure, talk to your doctor.   POST-HOSPITAL & CARE INSTRUCTIONS We recommend following up with your PCP within 1 week from being discharged from the hospital. Please let PCP/Specialists know of any changes in medications that were made which you will be able to see in the medications section of this packet.  DOCTOR'S APPOINTMENTS & FOLLOW UP Future Appointments  Date Time Provider Department Center  07/21/2023  2:45 PM Regal, Kirstie Peri, DPM [facility]     Thank you for choosing [facility]! Take care and be well!  Family Medicine Teaching Service Inpatient Team [place]  [facility]  [address] [place], [place] [address] [phone]

## 2023-07-17 NOTE — Assessment & Plan Note (Signed)
Currently mild epigastric abdominal pain this morning, patient notes chronic umbilical hernia and attributes discomfort to this. No further episodes of diarrhea, vomiting. - Continue Zofran 4 mg as needed. - miralax BID, Senna 1 tab BID

## 2023-07-17 NOTE — Plan of Care (Signed)

## 2023-07-17 NOTE — Assessment & Plan Note (Addendum)
K+ 3.5. Repleted multiple times in hospital and improving. - replete today with K+ - AM BMP tomorrow if patient remains in hospital at that time - Restart Torsemide 40mg  once daily at discharge - Continue spironolactone 25 mg - continue Klor-Con BID

## 2023-07-21 ENCOUNTER — Encounter: Payer: Self-pay | Admitting: Podiatry

## 2023-07-21 ENCOUNTER — Ambulatory Visit: Payer: 59 | Admitting: Podiatry

## 2023-07-21 DIAGNOSIS — M79675 Pain in left toe(s): Secondary | ICD-10-CM | POA: Diagnosis not present

## 2023-07-21 DIAGNOSIS — G629 Polyneuropathy, unspecified: Secondary | ICD-10-CM

## 2023-07-21 DIAGNOSIS — B351 Tinea unguium: Secondary | ICD-10-CM | POA: Diagnosis not present

## 2023-07-21 DIAGNOSIS — M79674 Pain in right toe(s): Secondary | ICD-10-CM

## 2023-07-22 NOTE — Progress Notes (Signed)
Subjective:   Patient ID: Maria [name], female   DOB: 67 y.o.   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

## 2023-07-28 ENCOUNTER — Other Ambulatory Visit: Payer: Self-pay

## 2023-07-28 ENCOUNTER — Inpatient Hospital Stay (HOSPITAL_COMMUNITY)
Admission: EM | Admit: 2023-07-28 | Discharge: 2023-08-03 | DRG: 552 | Disposition: A | Discharge status: Skilled Nursing Facility | Payer: 59 | Attending: Family Medicine | Admitting: Family Medicine

## 2023-07-28 ENCOUNTER — Emergency Department (HOSPITAL_COMMUNITY): Payer: 59

## 2023-07-28 DIAGNOSIS — F1120 Opioid dependence, uncomplicated: Secondary | ICD-10-CM | POA: Diagnosis present

## 2023-07-28 DIAGNOSIS — I5030 Unspecified diastolic (congestive) heart failure: Secondary | ICD-10-CM | POA: Diagnosis present

## 2023-07-28 DIAGNOSIS — W010XXA Fall on same level from slipping, tripping and stumbling without subsequent striking against object, initial encounter: Secondary | ICD-10-CM | POA: Diagnosis present

## 2023-07-28 DIAGNOSIS — Z79891 Long term (current) use of opiate analgesic: Secondary | ICD-10-CM

## 2023-07-28 DIAGNOSIS — Z825 Family history of asthma and other chronic lower respiratory diseases: Secondary | ICD-10-CM

## 2023-07-28 DIAGNOSIS — K219 Gastro-esophageal reflux disease without esophagitis: Secondary | ICD-10-CM | POA: Diagnosis present

## 2023-07-28 DIAGNOSIS — I890 Lymphedema, not elsewhere classified: Secondary | ICD-10-CM | POA: Diagnosis present

## 2023-07-28 DIAGNOSIS — N1831 Chronic kidney disease, stage 3a: Secondary | ICD-10-CM | POA: Diagnosis present

## 2023-07-28 DIAGNOSIS — E1122 Type 2 diabetes mellitus with diabetic chronic kidney disease: Secondary | ICD-10-CM | POA: Diagnosis present

## 2023-07-28 DIAGNOSIS — F411 Generalized anxiety disorder: Secondary | ICD-10-CM | POA: Diagnosis present

## 2023-07-28 DIAGNOSIS — W19XXXA Unspecified fall, initial encounter: Secondary | ICD-10-CM | POA: Diagnosis not present

## 2023-07-28 DIAGNOSIS — Y92009 Unspecified place in unspecified non-institutional (private) residence as the place of occurrence of the external cause: Secondary | ICD-10-CM

## 2023-07-28 DIAGNOSIS — I5082 Biventricular heart failure: Secondary | ICD-10-CM | POA: Diagnosis present

## 2023-07-28 DIAGNOSIS — G4733 Obstructive sleep apnea (adult) (pediatric): Secondary | ICD-10-CM | POA: Diagnosis present

## 2023-07-28 DIAGNOSIS — Z885 Allergy status to narcotic agent status: Secondary | ICD-10-CM

## 2023-07-28 DIAGNOSIS — G894 Chronic pain syndrome: Secondary | ICD-10-CM | POA: Diagnosis present

## 2023-07-28 DIAGNOSIS — Z888 Allergy status to other drugs, medicaments and biological substances status: Secondary | ICD-10-CM

## 2023-07-28 DIAGNOSIS — M545 Low back pain, unspecified: Secondary | ICD-10-CM | POA: Diagnosis present

## 2023-07-28 DIAGNOSIS — M5416 Radiculopathy, lumbar region: Secondary | ICD-10-CM

## 2023-07-28 DIAGNOSIS — M47814 Spondylosis without myelopathy or radiculopathy, thoracic region: Secondary | ICD-10-CM | POA: Diagnosis present

## 2023-07-28 DIAGNOSIS — I5022 Chronic systolic (congestive) heart failure: Secondary | ICD-10-CM | POA: Diagnosis present

## 2023-07-28 DIAGNOSIS — M25571 Pain in right ankle and joints of right foot: Secondary | ICD-10-CM | POA: Insufficient documentation

## 2023-07-28 DIAGNOSIS — I10 Essential (primary) hypertension: Secondary | ICD-10-CM | POA: Diagnosis present

## 2023-07-28 DIAGNOSIS — D638 Anemia in other chronic diseases classified elsewhere: Secondary | ICD-10-CM | POA: Diagnosis present

## 2023-07-28 DIAGNOSIS — E1151 Type 2 diabetes mellitus with diabetic peripheral angiopathy without gangrene: Secondary | ICD-10-CM | POA: Diagnosis present

## 2023-07-28 DIAGNOSIS — M48061 Spinal stenosis, lumbar region without neurogenic claudication: Secondary | ICD-10-CM | POA: Diagnosis present

## 2023-07-28 DIAGNOSIS — F32A Depression, unspecified: Secondary | ICD-10-CM | POA: Diagnosis present

## 2023-07-28 DIAGNOSIS — F40240 Claustrophobia: Secondary | ICD-10-CM | POA: Diagnosis present

## 2023-07-28 DIAGNOSIS — E785 Hyperlipidemia, unspecified: Secondary | ICD-10-CM | POA: Diagnosis present

## 2023-07-28 DIAGNOSIS — Z91048 Other nonmedicinal substance allergy status: Secondary | ICD-10-CM

## 2023-07-28 DIAGNOSIS — Z7985 Long-term (current) use of injectable non-insulin antidiabetic drugs: Secondary | ICD-10-CM

## 2023-07-28 DIAGNOSIS — Z800 Family history of malignant neoplasm of digestive organs: Secondary | ICD-10-CM

## 2023-07-28 DIAGNOSIS — I130 Hypertensive heart and chronic kidney disease with heart failure and stage 1 through stage 4 chronic kidney disease, or unspecified chronic kidney disease: Secondary | ICD-10-CM | POA: Diagnosis present

## 2023-07-28 DIAGNOSIS — Z833 Family history of diabetes mellitus: Secondary | ICD-10-CM

## 2023-07-28 DIAGNOSIS — M25562 Pain in left knee: Secondary | ICD-10-CM | POA: Diagnosis present

## 2023-07-28 DIAGNOSIS — Z87891 Personal history of nicotine dependence: Secondary | ICD-10-CM

## 2023-07-28 DIAGNOSIS — Z8249 Family history of ischemic heart disease and other diseases of the circulatory system: Secondary | ICD-10-CM

## 2023-07-28 DIAGNOSIS — G2581 Restless legs syndrome: Secondary | ICD-10-CM | POA: Diagnosis present

## 2023-07-28 DIAGNOSIS — M5124 Other intervertebral disc displacement, thoracic region: Principal | ICD-10-CM | POA: Diagnosis present

## 2023-07-28 DIAGNOSIS — Z8049 Family history of malignant neoplasm of other genital organs: Secondary | ICD-10-CM

## 2023-07-28 DIAGNOSIS — Z880 Allergy status to penicillin: Secondary | ICD-10-CM

## 2023-07-28 DIAGNOSIS — Z91040 Latex allergy status: Secondary | ICD-10-CM

## 2023-07-28 DIAGNOSIS — G8929 Other chronic pain: Principal | ICD-10-CM

## 2023-07-28 DIAGNOSIS — R29898 Other symptoms and signs involving the musculoskeletal system: Secondary | ICD-10-CM

## 2023-07-28 DIAGNOSIS — M25561 Pain in right knee: Secondary | ICD-10-CM | POA: Diagnosis present

## 2023-07-28 DIAGNOSIS — M4804 Spinal stenosis, thoracic region: Secondary | ICD-10-CM | POA: Diagnosis present

## 2023-07-28 DIAGNOSIS — Z79899 Other long term (current) drug therapy: Secondary | ICD-10-CM

## 2023-07-28 DIAGNOSIS — E876 Hypokalemia: Secondary | ICD-10-CM | POA: Diagnosis present

## 2023-07-28 DIAGNOSIS — I50812 Chronic right heart failure: Secondary | ICD-10-CM | POA: Diagnosis present

## 2023-07-28 DIAGNOSIS — R300 Dysuria: Secondary | ICD-10-CM

## 2023-07-28 DIAGNOSIS — Z818 Family history of other mental and behavioral disorders: Secondary | ICD-10-CM

## 2023-07-28 LAB — CBC WITH DIFFERENTIAL/PLATELET
Abs Immature Granulocytes: 0.01 10*3/uL (ref 0.00–0.07)
Basophils Absolute: 0.1 10*3/uL (ref 0.0–0.1)
Basophils Relative: 1 %
Eosinophils Absolute: 0.2 10*3/uL (ref 0.0–0.5)
Eosinophils Relative: 3 %
HCT: 34.7 % — ABNORMAL LOW (ref 36.0–46.0)
Hemoglobin: 10.9 g/dL — ABNORMAL LOW (ref 12.0–15.0)
Immature Granulocytes: 0 %
Lymphocytes Relative: 30 %
Lymphs Abs: 2.4 10*3/uL (ref 0.7–4.0)
MCH: 28.8 pg (ref 26.0–34.0)
MCHC: 31.4 g/dL (ref 30.0–36.0)
MCV: 91.8 fL (ref 80.0–100.0)
Monocytes Absolute: 0.6 10*3/uL (ref 0.1–1.0)
Monocytes Relative: 8 %
Neutro Abs: 4.7 10*3/uL (ref 1.7–7.7)
Neutrophils Relative %: 58 %
Platelets: 257 10*3/uL (ref 150–400)
RBC: 3.78 MIL/uL — ABNORMAL LOW (ref 3.87–5.11)
RDW: 14.4 % (ref 11.5–15.5)
WBC: 8 10*3/uL (ref 4.0–10.5)
nRBC: 0 % (ref 0.0–0.2)

## 2023-07-28 LAB — BASIC METABOLIC PANEL
Anion gap: 8 (ref 5–15)
BUN: 9 mg/dL (ref 8–23)
CO2: 24 mmol/L (ref 22–32)
Calcium: 8.8 mg/dL — ABNORMAL LOW (ref 8.9–10.3)
Chloride: 103 mmol/L (ref 98–111)
Creatinine, Ser: 1.2 mg/dL — ABNORMAL HIGH (ref 0.44–1.00)
GFR, Estimated: 50 mL/min — ABNORMAL LOW (ref >60)
Glucose, Bld: 79 mg/dL (ref 70–99)
Potassium: 4.6 mmol/L (ref 3.5–5.1)
Sodium: 135 mmol/L (ref 135–145)

## 2023-07-28 MED ORDER — DIAZEPAM 5 MG PO TABS
5.0000 mg | ORAL_TABLET | Freq: Once | ORAL | Status: AC
  Administered 2023-07-28: 5 mg via ORAL
  Filled 2023-07-28: qty 1

## 2023-07-28 MED ORDER — MORPHINE SULFATE (PF) 4 MG/ML IV SOLN
8.0000 mg | Freq: Once | INTRAVENOUS | Status: AC
  Administered 2023-07-28: 8 mg via SUBCUTANEOUS
  Filled 2023-07-28: qty 2

## 2023-07-28 MED ORDER — ONDANSETRON 4 MG PO TBDP
8.0000 mg | ORAL_TABLET | Freq: Once | ORAL | Status: AC
  Administered 2023-07-28: 8 mg via ORAL
  Filled 2023-07-28: qty 2

## 2023-07-28 MED ORDER — LACTATED RINGERS IV SOLN: INTRAVENOUS | Status: DC

## 2023-07-28 MED ORDER — OXYCODONE-ACETAMINOPHEN 5-325 MG PO TABS
1.0000 | ORAL_TABLET | Freq: Once | ORAL | Status: AC
  Administered 2023-07-28: 1 via ORAL
  Filled 2023-07-28: qty 1

## 2023-07-28 NOTE — ED Triage Notes (Signed)
Pt from home by ambulance. Pt states difficulty walking for 4 weeks. Today at dinner she went to get up and fell to both knees. No thinners, no loc, no dizziness, did not hit head. Pain 9/10. Usus a walker to get around at home. [id number]

## 2023-07-28 NOTE — ED Provider Notes (Signed)
[facility] AT [facility] Provider Note   CSN: [id number] Arrival date & time: 07/28/23  2028     History  Chief Complaint  Patient presents with   Knee Pain    Bi lat knee, lower back, back of neck/head.    Maria [name] is a 67 y.o. female.  67 year old female presents with mechanical fall just prior to arrival.  Patient states that she fell onto both of her knees.  Has been having gait issues for the past several weeks.  Has been using a cane and a walker.  Has had some lower back discomfort but denies any bowel or bladder dysfunction.  Patient does note some perineal paresthesias.  Some radicular symptoms.  States she went to turn this evening and lost her balance and fell onto both knees.  Could not ambulate after the event.  Also notes lower lumbar spine as well as pelvic discomfort.  Called EMS and was transported here       Home Medications Prior to Admission medications   Medication Sig Start Date End Date Taking? Authorizing Provider  albuterol (PROAIR HFA) 108 (90 Base) MCG/ACT inhaler Inhale 2 puffs into the lungs every 6 (six) hours as needed for wheezing or shortness of breath. 11/05/20   Terressa Koyanagi, DO  Blood Glucose Monitoring Suppl (ACCU-CHEK GUIDE) w/Device KIT Use as directed Patient taking differently: 1 each by Other route as directed. 04/11/21   Zigmund Daniel., MD  cholecalciferol (VITAMIN D3) 25 MCG (1000 UNIT) tablet Take 2,000 Units by mouth daily.    [provider]  citalopram (CELEXA) 20 MG tablet Take 1 tablet (20 mg total) by mouth daily. 11/02/22   Corwin Levins, MD  clonazePAM (KLONOPIN) 1 MG tablet TAKE ONE TABLET BY MOUTH TWICE DAILY AS NEEDED FOR ANXIETY Patient taking differently: Take 1 mg by mouth 2 (two) times daily as needed for anxiety. for anxiety 06/08/23   Corwin Levins, MD  clotrimazole (LOTRIMIN) 1 % cream Apply topically 2 (two) times daily. 02/12/23   Crissie Sickles, MD  cyanocobalamin  1000 MCG tablet Take 1 tablet (1,000 mcg total) by mouth daily. 07/18/23   Alicia Amel, MD  fluticasone (FLONASE) 50 MCG/ACT nasal spray Place 1 spray into both nostrils daily. 12/07/22   [provider]  glucose blood (ACCU-CHEK GUIDE) test strip Use as instructed up to 4 times daily 04/11/21   Zigmund Daniel., MD  ipratropium-albuterol (DUONEB) 0.5-2.5 (3) MG/3ML SOLN Inhale 3 mLs into the lungs every 6 (six) hours for 5 days. Patient not taking: Reported on 07/16/2023 02/12/23 02/17/23  Crissie Sickles, MD  Metamucil Fiber CHEW Chew 3 tablets by mouth at bedtime.    [provider]  metoprolol succinate (TOPROL-XL) 25 MG 24 hr tablet Take 1 tablet (25 mg total) by mouth daily. 07/18/23   Alicia Amel, MD  MOUNJARO 10 MG/0.5ML Pen Inject 10 mg into the skin once a week. 06/29/23   [provider]  NARCAN 4 MG/0.1ML LIQD nasal spray kit Place 0.4 mg into the nose daily as needed (opioid overdose). 04/25/19   [provider]  ondansetron (ZOFRAN ODT) 4 MG disintegrating tablet Take 1 tablet (4 mg total) by mouth every 4 (four) hours as needed for nausea or vomiting. 08/23/19   Arby Barrette, MD  Oxycodone HCl 10 MG TABS Take 1 tablet (10 mg total) 4 (four) times daily by mouth. Per [facility] Patient taking differently: Take  10 mg by mouth every 4 (four) hours as needed (pain). Per [facility] 11/08/17   Corwin Levins, MD  pantoprazole (PROTONIX) 40 MG tablet TAKE ONE TABLET BY MOUTH DAILY Patient taking differently: Take 40 mg by mouth daily. 01/29/22   Corwin Levins, MD  potassium chloride SA (KLOR-CON M) 20 MEQ tablet Take 2 tablets (40 mEq total) by mouth 2 (two) times daily. 07/17/23   Cyndia Skeeters, DO  pramipexole (MIRAPEX) 0.5 MG tablet Take 1 tablet (0.5 mg total) by mouth 3 (three) times daily. 06/24/22   Corwin Levins, MD  senna-docusate (SENOKOT-S) 8.6-50 MG tablet Take 1 tablet by mouth at bedtime as needed for mild  constipation. Patient not taking: Reported on 07/16/2023 02/12/23   Crissie Sickles, MD  spironolactone (ALDACTONE) 25 MG tablet Take 0.5 tablets (12.5 mg total) by mouth daily. Patient taking differently: Take 25 mg by mouth daily. 08/14/22   Sharlene Dory, NP  torsemide (DEMADEX) 20 MG tablet Take 2 tablets (40 mg total) by mouth daily. 07/17/23   Alicia Amel, MD  DULoxetine (CYMBALTA) 60 MG capsule Take 60 mg by mouth daily.    03/09/12  [provider]      Allergies    Amoxicillin-pot clavulanate, Adhesive [tape], Codeine, Crestor [rosuvastatin calcium], Lyrica [pregabalin], Morphine and codeine, Vicodin [hydrocodone-acetaminophen], and Latex    Review of Systems   Review of Systems  All other systems reviewed and are negative.   Physical Exam Updated Vital Signs BP 138/82 (BP Location: Right Arm)   Pulse 76   Temp 98 F (36.7 C) (Oral)   Resp 20   Ht 1.6 m (5\' 3" )   Wt (!) 143.8 kg   SpO2 97%   BMI 56.15 kg/m  Physical Exam Vitals and nursing note reviewed.  Constitutional:      General: She is not in acute distress.    Appearance: Normal appearance. She is well-developed. She is not toxic-appearing.  HENT:     Head: Normocephalic and atraumatic.  Eyes:     General: Lids are normal.     Conjunctiva/sclera: Conjunctivae normal.     Pupils: Pupils are equal, round, and reactive to light.  Neck:     Thyroid: No thyroid mass.     Trachea: No tracheal deviation.  Cardiovascular:     Rate and Rhythm: Normal rate and regular rhythm.     Heart sounds: Normal heart sounds. No murmur heard.    No gallop.  Pulmonary:     Effort: Pulmonary effort is normal. No respiratory distress.     Breath sounds: Normal breath sounds. No stridor. No decreased breath sounds, wheezing, rhonchi or rales.  Abdominal:     General: There is no distension.     Palpations: Abdomen is soft.     Tenderness: There is no abdominal tenderness. There is no rebound.  Musculoskeletal:         General: Normal range of motion.     Cervical back: Normal range of motion and neck supple.     Lumbar back: Tenderness present.       Back:       Legs:     Comments: Strength is 4 out of 5 bilateral lower extremities no shortening or rotation noted to her lower extremities.  Normal plantar and dorsiflexion  Skin:    General: Skin is warm and dry.     Findings: No abrasion or rash.  Neurological:     Mental Status: She is alert  and oriented to person, place, and time. Mental status is at baseline.     GCS: GCS eye subscore is 4. GCS verbal subscore is 5. GCS motor subscore is 6.     Cranial Nerves: Cranial nerves are intact. No cranial nerve deficit.     Sensory: No sensory deficit.     Motor: Motor function is intact.  Psychiatric:        Attention and Perception: Attention normal.        Speech: Speech normal.        Behavior: Behavior normal.     ED Results / Procedures / Treatments   Labs (all labs ordered are listed, but only abnormal results are displayed) Labs Reviewed - No data to display  EKG None  Radiology No results found.  Procedures Procedures    Medications Ordered in ED Medications  diazepam (VALIUM) tablet 5 mg (has no administration in time range)  oxyCODONE-acetaminophen (PERCOCET/ROXICET) 5-325 MG per tablet 1 tablet (has no administration in time range)    ED Course/ Medical Decision Making/ A&P                                 Medical Decision Making Amount and/or Complexity of Data Reviewed Labs: ordered. Radiology: ordered.  Risk Prescription drug management.   Patient medicated for pain here.  Patient x-rays done of her bilateral knees, bilateral ankles, LS-spine and pelvis and femur reviewed interpretation showed no acute fractures.  Patient requires MRI but states that she can only do it if is open.  Patient will require general anesthesia to have this done.  She will need to be admitted for observation for this.  Care turned  over to Dr. Madilyn Hook        Final Clinical Impression(s) / ED Diagnoses Final diagnoses:  None    Rx / DC Orders ED Discharge Orders     None         Lorre Nick, MD 07/28/23 2313

## 2023-07-28 NOTE — H&P (Signed)
PCP:   Ignatius Specking, NP   Chief Complaint:  Increased lower extremity weakness with radicular symptoms  HPI: This is a 67 year old female with past medical history of anxiety and depression, T2DM, HLD, HTN, HFrEF, GERD.  Patient also has chronic arthritis.  She has known arthritis in her back, hip, knees, per patient right knee is bone-on-bone.  Her pain doctor is in process of getting her qualified for a pain stimulator.  Over the course of the last 4 weeks she has developed progressive lower extremity weakness.  She states she has to rock back and forth in order to get up and when she does her legs are weak.  She has had increasing back and knee pain.  More recently she developed burning on the soles of her feet bilaterally.  She endorses radicular pain from her lower back down her right lower extremity.  This has been going on for the past 2 weeks.  She has never had MRI of her back.  Today she fell, mechanical fall, unwitnessed.  She was unable to get up.  She laid on floor for approximately 45 minutes until she was able to get assistance.  She came to the ER.  In the ER imaging of her pelvis, L-spine, T-spine, bilateral knees, and ankles are unrevealing.  EDP wanted MRI of T and L-spine however he is unable to do it as patient states she is severely claustrophobic and is only able to do MRIs if his open MRI or issues under general anesthesia.  Unfortunately they are unable to do MRIs under general anesthesia in the ER.  Admission requested for this to be done.  Review of Systems:  Per HPI  Past Medical History: Past Medical History:  Diagnosis Date   Acute lymphadenitis 2011   ALLERGIC RHINITIS 08/10/2007   ANXIETY 12/06/2007   Atherosclerotic peripheral vascular disease (HCC) 06/13/2013   Aorta on CT June 2014   Cellulitis 2011   Cervical disc disease 03/09/2012   CHF (congestive heart failure) (HCC)    Chronic pain 03/09/2012   DEPRESSION 12/06/2007   Diabetes (HCC)    DIVERTICULOSIS,  [place] 12/06/2007   GERD 12/06/2007   Hepatitis    age 10 hepatitis A   HLD (hyperlipidemia) 05/24/2019   HYPERTENSION 12/06/2007   Lumbar disc disease 03/09/2012   Mesenteric adenitis    MRSA 2006   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 2010   CERVICAL DISC SURGERY  may 24th 2010   COLONOSCOPY WITH PROPOFOL N/A 07/10/2016   Procedure: COLONOSCOPY WITH PROPOFOL;  Surgeon: Ruffin Frederick, MD;  Location: [facility];  Service: Gastroenterology;  Laterality: N/A;   COLONOSCOPY WITH PROPOFOL N/A 09/26/2019   Procedure: COLONOSCOPY WITH PROPOFOL;  Surgeon: Benancio Deeds, MD;  Location: [facility];  Service: Gastroenterology;  Laterality: N/A;   ESOPHAGOGASTRODUODENOSCOPY (EGD) WITH PROPOFOL N/A 07/10/2016   Procedure: ESOPHAGOGASTRODUODENOSCOPY (EGD) WITH PROPOFOL;  Surgeon: Ruffin Frederick, MD;  Location: [facility];  Service: Gastroenterology;  Laterality: N/A;   POLYPECTOMY  09/26/2019   Procedure: POLYPECTOMY;  Surgeon: Benancio Deeds, MD;  Location: [facility];  Service: Gastroenterology;;   s/p ovary cyst     s/p right knee arthroscopy     Dr. Dion Saucier ortho    Medications: Prior to Admission medications  Medication Sig Start Date End Date Taking? Authorizing Provider  albuterol (PROAIR HFA) 108 (90 Base) MCG/ACT inhaler Inhale 2 puffs into the lungs every 6 (six) hours as needed for wheezing or shortness of breath. 11/05/20   Terressa Koyanagi, DO  Blood Glucose Monitoring Suppl (ACCU-CHEK GUIDE) w/Device KIT Use as directed Patient taking differently: 1 each by Other route as directed. 04/11/21   Zigmund Daniel., MD  cholecalciferol (VITAMIN D3) 25 MCG (1000 UNIT) tablet Take 2,000 Units by mouth daily.    [provider]   citalopram (CELEXA) 20 MG tablet Take 1 tablet (20 mg total) by mouth daily. 11/02/22   Corwin Levins, MD  clonazePAM (KLONOPIN) 1 MG tablet TAKE ONE TABLET BY MOUTH TWICE DAILY AS NEEDED FOR ANXIETY Patient taking differently: Take 1 mg by mouth 2 (two) times daily as needed for anxiety. for anxiety 06/08/23   Corwin Levins, MD  clotrimazole (LOTRIMIN) 1 % cream Apply topically 2 (two) times daily. 02/12/23   Crissie Sickles, MD  cyanocobalamin 1000 MCG tablet Take 1 tablet (1,000 mcg total) by mouth daily. 07/18/23   Alicia Amel, MD  fluticasone (FLONASE) 50 MCG/ACT nasal spray Place 1 spray into both nostrils daily. 12/07/22   [provider]  glucose blood (ACCU-CHEK GUIDE) test strip Use as instructed up to 4 times daily 04/11/21   Zigmund Daniel., MD  ipratropium-albuterol (DUONEB) 0.5-2.5 (3) MG/3ML SOLN Inhale 3 mLs into the lungs every 6 (six) hours for 5 days. Patient not taking: Reported on 07/16/2023 02/12/23 02/17/23  Crissie Sickles, MD  Metamucil Fiber CHEW Chew 3 tablets by mouth at bedtime.    [provider]  metoprolol succinate (TOPROL-XL) 25 MG 24 hr tablet Take 1 tablet (25 mg total) by mouth daily. 07/18/23   Alicia Amel, MD  MOUNJARO 10 MG/0.5ML Pen Inject 10 mg into the skin once a week. 06/29/23   [provider]  NARCAN 4 MG/0.1ML LIQD nasal spray kit Place 0.4 mg into the nose daily as needed (opioid overdose). 04/25/19   [provider]  ondansetron (ZOFRAN ODT) 4 MG disintegrating tablet Take 1 tablet (4 mg total) by mouth every 4 (four) hours as needed for nausea or vomiting. 08/23/19   Arby Barrette, MD  Oxycodone HCl 10 MG TABS Take 1 tablet (10 mg total) 4 (four) times daily by mouth. Per [facility] Patient taking differently: Take 10 mg by mouth every 4 (four) hours as needed (pain). Per [facility] 11/08/17   Corwin Levins, MD  pantoprazole (PROTONIX) 40 MG tablet TAKE ONE TABLET BY MOUTH DAILY Patient  taking differently: Take 40 mg by mouth daily. 01/29/22   Corwin Levins, MD  potassium chloride SA (KLOR-CON M) 20 MEQ tablet Take 2 tablets (40 mEq total) by mouth 2 (two) times daily. 07/17/23   Cyndia Skeeters, DO  pramipexole (MIRAPEX) 0.5 MG tablet Take 1 tablet (0.5 mg total) by mouth 3 (three) times daily. 06/24/22   Corwin Levins, MD  senna-docusate (SENOKOT-S) 8.6-50 MG tablet Take 1 tablet by mouth at bedtime as needed for mild constipation. Patient not taking: Reported on 07/16/2023 02/12/23   Crissie Sickles, MD  spironolactone (ALDACTONE) 25 MG tablet Take 0.5 tablets (12.5 mg total) by mouth daily. Patient taking differently: Take 25 mg by mouth daily. 08/14/22   Sharlene Dory, NP  torsemide (DEMADEX) 20 MG tablet Take 2 tablets (40 mg total) by mouth daily. 07/17/23   Marisue Humble,  Barbera Setters, MD  DULoxetine (CYMBALTA) 60 MG capsule Take 60 mg by mouth daily.    03/09/12  [provider]    Allergies:   Allergies  Allergen Reactions   Amoxicillin-Pot Clavulanate Nausea And Vomiting    Projectile vomiting Did it involve swelling of the face/tongue/throat, SOB, or low BP? No Did it involve sudden or severe rash/hives, skin peeling, or any reaction on the inside of your mouth or nose? No Did you need to seek medical attention at a hospital or doctor's office? No When did it last happen?      20-30 years If all above answers are "NO", may proceed with cephalosporin use.    Adhesive [Tape] Other (See Comments)    Tears skin off - use paper tape    Codeine Other (See Comments)    hallucinations   Crestor [Rosuvastatin Calcium]     Severe muscle cramps   Lyrica [Pregabalin] Other (See Comments)    weakness   Morphine And Codeine Nausea And Vomiting and Other (See Comments)    Migraine headaches   Vicodin [Hydrocodone-Acetaminophen] Other (See Comments)    hallucinations   Latex Rash    Social History:  reports that she quit smoking about 14 years ago. Her smoking use included  cigarettes. She started smoking about 44 years ago. She has never used smokeless tobacco. She reports that she does not drink alcohol and does not use drugs.  Family History: Family History  Problem Relation Age of Onset   Heart disease Mother    Hypertension Mother    Diabetes Mother    Heart failure Mother    Asthma Sister    Anxiety disorder Sister    Depression Sister    Hypertension Father    Asthma Daughter    Bipolar disorder Daughter    Cancer Maternal Uncle        [place]   Cancer Other        ovarian   Liver cancer Paternal Grandmother        ????    Physical Exam: Vitals:   07/28/23 2035 07/28/23 2036  BP: 138/82   Pulse: 76   Resp: 20   Temp: 98 F (36.7 C)   TempSrc: Oral   SpO2: 97%   Weight:  (!) 143.8 kg  Height:  5\' 3"  (1.6 m)    General:  Alert and oriented times three, extreme morbid patient, no acute distress Eyes: No scleral icterus ENT: Moist oral mucosa, neck supple, no thyromegaly Lungs: CTA B/L, no wheeze, no crackles, no use of accessory muscles Cardiovascular: regular rate and rhythm,, no murmurs. No carotid bruits, no JVD Abdomen: soft, positive BS, NTND, not an acute abdomen GU: not examined Neuro: CN II - XII grossly intact Musculoskeletal: strength 5/5 all extremities, >3+ B/L LE, lymphedema, xerosis, bB/L knees TTP w/o  evidence of effusion. Skin: no rash, no subcutaneous crepitation, no decubitus Psych: appropriate patient   Labs on Admission:  Recent Labs    07/28/23 2232  NA 135  K 4.6  CL 103  CO2 24  GLUCOSE 79  BUN 9  CREATININE 1.20*  CALCIUM 8.8*   Recent Labs    07/28/23 2232  WBC 8.0  NEUTROABS 4.7  HGB 10.9*  HCT 34.7*  MCV 91.8  PLT 257    Radiological Exams on Admission: DG Ankle Complete Right  Result Date: 07/28/2023 CLINICAL DATA:  Weakness, fall EXAM: RIGHT ANKLE - COMPLETE 3+ VIEW COMPARISON:  None Available. FINDINGS: No fracture  or dislocation is seen. The ankle mortise is intact. Mild soft  tissue swelling/edema along the dorsum of the foot. Small plantar calcaneal enthesophyte. IMPRESSION: Negative. Electronically Signed   By: Charline Bills M.D.   On: 07/28/2023 22:01   DG Ankle Complete Left  Result Date: 07/28/2023 CLINICAL DATA:  Weakness, fall EXAM: LEFT ANKLE COMPLETE - 3+ VIEW COMPARISON:  None Available. FINDINGS: No fracture or dislocation is seen. The ankle mortise is intact. The base of the fifth metatarsal is unremarkable. Visualized soft tissues are within normal limits. Small plantar calcaneal enthesophyte. IMPRESSION: Negative. Electronically Signed   By: Charline Bills M.D.   On: 07/28/2023 22:00   DG Lumbar Spine Complete  Result Date: 07/28/2023 CLINICAL DATA:  Weakness, fall EXAM: LUMBAR SPINE - COMPLETE 4+ VIEW COMPARISON:  CT abdomen/pelvis dated 07/15/2023 FINDINGS: Five lumbar-type vertebral bodies. Normal lumbar lordosis. No evidence of fracture or dislocation. Vertebral body heights and intervertebral disc spaces are maintained. Visualized bony pelvis is intact. Vascular calcifications. IMPRESSION: Negative. Electronically Signed   By: Charline Bills M.D.   On: 07/28/2023 22:00   DG Pelvis 1-2 Views  Result Date: 07/28/2023 CLINICAL DATA:  Weakness, fall EXAM: PELVIS - 1-2 VIEW COMPARISON:  None Available. FINDINGS: No fracture or dislocation is seen. Bilateral hip spaces are preserved. Visualized bony pelvis appears intact. IMPRESSION: Negative. Electronically Signed   By: Charline Bills M.D.   On: 07/28/2023 21:59   DG Knee Complete 4 Views Left  Result Date: 07/28/2023 CLINICAL DATA:  Weakness, fall EXAM: LEFT KNEE - COMPLETE 4+ VIEW COMPARISON:  None Available. FINDINGS: No fracture or dislocation is seen. Mild to moderate degenerative changes, most prominent in the medial compartment. Visualized soft tissues are within normal limits. No suprapatellar knee joint effusion. IMPRESSION: No fracture or dislocation is seen. Mild to moderate degenerative  changes. Electronically Signed   By: Charline Bills M.D.   On: 07/28/2023 21:59   DG Knee Complete 4 Views Right  Result Date: 07/28/2023 CLINICAL DATA:  Weakness, fall EXAM: RIGHT KNEE - COMPLETE 4+ VIEW COMPARISON:  None Available. FINDINGS: Moderate tricompartmental degenerative changes, most prominent in the medial compartment. No fracture or dislocation is seen. Visualized soft tissues are within normal limits. No suprapatellar knee joint effusion. IMPRESSION: No fracture or dislocation is seen. Moderate degenerative changes. Electronically Signed   By: Charline Bills M.D.   On: 07/28/2023 21:58    Assessment/Plan Present on Admission:  Fall -PT/OT consult   Progressive lower extremity weakness/radicular signs -MRI T/L spine under general anesthesia.  Defer to a.m. team to consult anesthesia -Continue Neurontin and Cymbalta   Anxiety state -Celexa and Cymbalta resumed   Chronic right-sided congestive heart failure (HCC) --Spironolactone, Demadex resumed    HTN (hypertension) -Metoprolol resumed  RLS -Mirapex resumed to 3 times a day   Chronic pain syndrome/narcotic dependence (HCC) -Oxycodone resumed.  Bowel regimen.   T2DM -Patient takes Cristy Friedlander on Tuesdays   Morbid obesity due to excess calories (HCC)  ,  07/28/2023, 11:46 PM

## 2023-07-29 ENCOUNTER — Encounter (HOSPITAL_COMMUNITY): Payer: Self-pay | Admitting: Family Medicine

## 2023-07-29 DIAGNOSIS — W19XXXA Unspecified fall, initial encounter: Secondary | ICD-10-CM | POA: Diagnosis not present

## 2023-07-29 DIAGNOSIS — G894 Chronic pain syndrome: Secondary | ICD-10-CM | POA: Diagnosis not present

## 2023-07-29 LAB — GLUCOSE, CAPILLARY
Glucose-Capillary: 97 mg/dL (ref 70–99)
Glucose-Capillary: 96 mg/dL (ref 70–99)

## 2023-07-29 LAB — CBG MONITORING, ED
Glucose-Capillary: 105 mg/dL — ABNORMAL HIGH (ref 70–99)
Glucose-Capillary: 76 mg/dL (ref 70–99)
Glucose-Capillary: 90 mg/dL (ref 70–99)
Glucose-Capillary: 98 mg/dL (ref 70–99)

## 2023-07-29 MED ORDER — BISACODYL 10 MG RE SUPP: 10.0000 mg | Freq: Every day | RECTAL | Status: DC | PRN

## 2023-07-29 MED ORDER — VITAMIN B-12 1000 MCG PO TABS
1000.0000 ug | ORAL_TABLET | Freq: Every day | ORAL | Status: DC
  Administered 2023-07-29 – 2023-08-03 (×6): 1000 ug via ORAL
  Filled 2023-07-29 (×6): qty 1

## 2023-07-29 MED ORDER — TORSEMIDE 20 MG PO TABS
40.0000 mg | ORAL_TABLET | Freq: Every day | ORAL | Status: DC
  Administered 2023-07-29 – 2023-08-03 (×6): 40 mg via ORAL
  Filled 2023-07-29 (×6): qty 2

## 2023-07-29 MED ORDER — POLYETHYLENE GLYCOL 3350 17 G PO PACK: 17.0000 g | PACK | Freq: Every day | ORAL | Status: DC | PRN

## 2023-07-29 MED ORDER — METOPROLOL SUCCINATE ER 25 MG PO TB24
25.0000 mg | ORAL_TABLET | Freq: Every day | ORAL | Status: DC
  Administered 2023-07-29 – 2023-08-03 (×4): 25 mg via ORAL
  Filled 2023-07-29 (×6): qty 1

## 2023-07-29 MED ORDER — INSULIN ASPART 100 UNIT/ML IJ SOLN: 0.0000 [IU] | Freq: Every day | INTRAMUSCULAR | Status: DC

## 2023-07-29 MED ORDER — PRAMIPEXOLE DIHYDROCHLORIDE 0.25 MG PO TABS
0.5000 mg | ORAL_TABLET | Freq: Three times a day (TID) | ORAL | Status: DC
  Administered 2023-07-29 – 2023-08-03 (×16): 0.5 mg via ORAL
  Filled 2023-07-29 (×18): qty 2

## 2023-07-29 MED ORDER — ENOXAPARIN SODIUM 40 MG/0.4ML IJ SOSY
40.0000 mg | PREFILLED_SYRINGE | INTRAMUSCULAR | Status: DC
  Administered 2023-07-29 – 2023-08-02 (×5): 40 mg via SUBCUTANEOUS
  Filled 2023-07-29 (×5): qty 0.4

## 2023-07-29 MED ORDER — PANTOPRAZOLE SODIUM 40 MG PO TBEC
40.0000 mg | DELAYED_RELEASE_TABLET | Freq: Every day | ORAL | Status: DC
  Administered 2023-07-29 – 2023-08-03 (×6): 40 mg via ORAL
  Filled 2023-07-29 (×6): qty 1

## 2023-07-29 MED ORDER — PSYLLIUM 95 % PO PACK
1.0000 | PACK | Freq: Every day | ORAL | Status: DC
  Administered 2023-07-31 – 2023-08-02 (×3): 1 via ORAL
  Filled 2023-07-29 (×5): qty 1

## 2023-07-29 MED ORDER — SPIRONOLACTONE 12.5 MG HALF TABLET
12.5000 mg | ORAL_TABLET | Freq: Every day | ORAL | Status: DC
  Administered 2023-07-29 – 2023-08-03 (×6): 12.5 mg via ORAL
  Filled 2023-07-29 (×6): qty 1

## 2023-07-29 MED ORDER — OXYCODONE HCL 5 MG PO TABS
10.0000 mg | ORAL_TABLET | ORAL | Status: DC | PRN
  Administered 2023-07-29 – 2023-08-03 (×17): 10 mg via ORAL
  Filled 2023-07-29 (×17): qty 2

## 2023-07-29 MED ORDER — SENNOSIDES-DOCUSATE SODIUM 8.6-50 MG PO TABS
1.0000 | ORAL_TABLET | Freq: Two times a day (BID) | ORAL | Status: DC
  Administered 2023-07-29 – 2023-08-03 (×6): 1 via ORAL
  Filled 2023-07-29 (×10): qty 1

## 2023-07-29 MED ORDER — INSULIN ASPART 100 UNIT/ML IJ SOLN
0.0000 [IU] | Freq: Three times a day (TID) | INTRAMUSCULAR | Status: DC
  Administered 2023-07-31 – 2023-08-01 (×4): 2 [IU] via SUBCUTANEOUS
  Administered 2023-08-02: 3 [IU] via SUBCUTANEOUS
  Administered 2023-08-03: 2 [IU] via SUBCUTANEOUS

## 2023-07-29 MED ORDER — CITALOPRAM HYDROBROMIDE 20 MG PO TABS
20.0000 mg | ORAL_TABLET | Freq: Every day | ORAL | Status: DC
  Administered 2023-07-29 – 2023-08-02 (×6): 20 mg via ORAL
  Filled 2023-07-29 (×2): qty 1
  Filled 2023-07-29: qty 2
  Filled 2023-07-29 (×3): qty 1

## 2023-07-29 MED ORDER — MORPHINE SULFATE (PF) 2 MG/ML IV SOLN: 2.0000 mg | INTRAVENOUS | Status: DC | PRN

## 2023-07-29 MED ORDER — HYDROMORPHONE HCL 1 MG/ML IJ SOLN
0.5000 mg | INTRAMUSCULAR | Status: DC | PRN
  Administered 2023-07-29 – 2023-07-30 (×2): 0.5 mg via INTRAVENOUS
  Filled 2023-07-29 (×2): qty 0.5

## 2023-07-29 NOTE — Anesthesia Preprocedure Evaluation (Addendum)
Anesthesia Evaluation  Patient identified by MRN, date of birth, ID band Patient awake    Reviewed: Allergy & Precautions, NPO status , Patient's Chart, lab work & pertinent test results  History of Anesthesia Complications Negative for: history of anesthetic complications  Airway Mallampati: II  TM Distance: >3 FB Neck ROM: Full    Dental  (+) Dental Advisory Given,    Pulmonary neg shortness of breath, sleep apnea (patient denies) , neg COPD, neg recent URI, former smoker   Pulmonary exam normal breath sounds clear to auscultation       Cardiovascular hypertension (metoprolol, spironolactone, torsemide), Pt. on home beta blockers and Pt. on medications (-) angina + Peripheral Vascular Disease and +CHF (EF 65-70%, grade II diastolic dysfunction)  (-) Past MI, (-) Cardiac Stents and (-) CABG  Rhythm:Regular Rate:Normal  HLD  TTE 02/09/2023: IMPRESSIONS    1. Left ventricular ejection fraction, by estimation, is 65 to 70%. The  left ventricle has normal function. Left ventricular endocardial border  not optimally defined to evaluate regional wall motion. The left  ventricular internal cavity size was mildly  dilated. There is moderate concentric left ventricular hypertrophy. Left  ventricular diastolic parameters are consistent with Grade II diastolic  dysfunction (pseudonormalization). Elevated left atrial pressure.   2. Right ventricular systolic function is normal. The right ventricular  size is normal. There is moderately elevated pulmonary artery systolic  pressure. The estimated right ventricular systolic pressure is 47.0 mmHg.   3. Left atrial size was moderately dilated.   4. A small pericardial effusion is present. The pericardial effusion is  circumferential. Large pleural effusion in the left lateral region. No  evidence of increased pericardial pressure.   5. The mitral valve is normal in structure. Trivial mitral  valve  regurgitation. No evidence of mitral stenosis.   6. The aortic valve is tricuspid. Aortic valve regurgitation is not  visualized. No aortic stenosis is present.   7. The inferior vena cava is dilated in size with <50% respiratory  variability, suggesting right atrial pressure of 15 mmHg.     Neuro/Psych neg Seizures PSYCHIATRIC DISORDERS Anxiety Depression    Chronic pain  Neuromuscular disease (lower extremity weakness, cervical disc disease)    GI/Hepatic ,GERD  Medicated,,(+) Hepatitis -Fatty liver Diverticulosis    Endo/Other  diabetes, Type 2  Morbid obesity  Renal/GU CRFRenal disease     Musculoskeletal  (+) Arthritis ,    Abdominal  (+) + obese  Peds  Hematology negative hematology ROS (+) Lab Results      Component                Value               Date                      WBC                      8.0                 07/28/2023                HGB                      10.9 (L)            07/28/2023                HCT  34.7 (L)            07/28/2023                MCV                      91.8                07/28/2023                PLT                      257                 07/28/2023              Anesthesia Other Findings Last Mounjaro: 8/6  Reproductive/Obstetrics                             Anesthesia Physical Anesthesia Plan  ASA: 3  Anesthesia Plan: General   Post-op Pain Management:    Induction: Intravenous and Rapid sequence  PONV Risk Score and Plan: 3 and Ondansetron, Dexamethasone and Treatment may vary due to age or medical condition  Airway Management Planned: Oral ETT  Additional Equipment:   Intra-op Plan:   Post-operative Plan: Extubation in OR  Informed Consent: I have reviewed the patients History and Physical, chart, labs and discussed the procedure including the risks, benefits and alternatives for the proposed anesthesia with the patient or authorized representative who  has indicated his/her understanding and acceptance.     Dental advisory given  Plan Discussed with: CRNA and Anesthesiologist  Anesthesia Plan Comments: (Risks of general anesthesia discussed including, but not limited to, sore throat, hoarse voice, chipped/damaged teeth, injury to vocal cords, nausea and vomiting, allergic reactions, lung infection, heart attack, stroke, and death. All questions answered. )       Anesthesia Quick Evaluation

## 2023-07-29 NOTE — Progress Notes (Signed)
PROGRESS NOTE    Maria [name]  XBM:[id number] DOB: [birth year] DOA: 07/28/2023 PCP: Ignatius Specking, NP   Brief Narrative:  67 year old female with past medical history of anxiety and depression, T2DM, HLD, HTN, HFrEF, GERD, chronic arthritis with chronic pain and on narcotics chronically presented with increased lower extremity weakness with radicular symptoms.  On presentation, x-ray of her pelvis, L-spine, T-spine, bilateral knees and ankles were unrevealing.  Patient could not get regular MRI because of photophobia.  She was admitted for intractable pain with need for MRI under general anesthesia.  Assessment & Plan:   Progressive lower extremity weakness with radicular symptoms Chronic arthritis with chronic pain with opiate dependence Recent fall: Possibly from above -On presentation, x-ray of her pelvis, L-spine, T-spine, bilateral knees and ankles were unrevealing.  Patient could not get regular MRI because of photophobia. -I have ordered MRI of thoracic and lumbar spine under general anesthesia.  Continue pain management and bowel regimen.   -Fall precautions.  PT eval  Chronic diastolic heart failure Hypertension -Compensated.  Strict input and output with daily weights.  Fluid restriction will continue torsemide, spironolactone and metoprolol succinate.  Outpatient follow-up with cardiology.  Diabetes mellitus type 2 -Continue CBGs SSI  Normocytic anemia/possible anemia of chronic disease -Hemoglobin stable.  Monitor intermittently.  No signs of bleeding.  CKD stage IIIa -Creatinine currently at baseline.  Monitor intermittently.  Restless leg syndrome -Continue pramipexole  Anxiety -continue citalopram  Morbid obesity--outpatient follow-up   DVT prophylaxis: Start Lovenox Code Status: Full Family Communication: None at bedside Disposition Plan: Status is: Observation The patient will require care spanning > 2 midnights and should be moved to inpatient because:  Of severe intractable pain and need for MRI under anesthesia  Consultants: None  Procedures: None  Antimicrobials: None   Subjective: Patient seen and examined at bedside.  She complains of intermittent severe lower back pain.  No fever, vomiting, shortness of attributed  Objective: Vitals:   07/29/23 0523 07/29/23 0600 07/29/23 0700 07/29/23 0806  BP: 138/64 120/67 128/64   Pulse: 69 66 71   Resp: 16 16 18    Temp:    98.9 F (37.2 C)  TempSrc:    Oral  SpO2: 95% 93% 95%   Weight:      Height:       No intake or output data in the 24 hours ending 07/29/23 1059 Filed Weights   07/28/23 2036  Weight: (!) 143.8 kg    Examination:  General exam: Appears calm and comfortable.  On room air but looks chronically under condition. Respiratory system: Bilateral decreased breath sounds at bases with some scattered crackles Cardiovascular system: S1 & S2 heard, Rate controlled Gastrointestinal system: Abdomen is morbidly obese, nondistended, soft and nontender. Normal bowel sounds heard. Extremities: No cyanosis, clubbing; bilateral lower extremity present  Central nervous system: Alert and oriented. No focal neurological deficits. Moving extremities Skin: No rashes, lesions or ulcers Psychiatry: Flat affect.  Not agitated.   Data Reviewed: I have personally reviewed following labs and imaging studies  CBC: Recent Labs  Lab 07/28/23 2232  WBC 8.0  NEUTROABS 4.7  HGB 10.9*  HCT 34.7*  MCV 91.8  PLT 257   Basic Metabolic Panel: Recent Labs  Lab 07/28/23 2232  NA 135  K 4.6  CL 103  CO2 24  GLUCOSE 79  BUN 9  CREATININE 1.20*  CALCIUM 8.8*   GFR: Estimated Creatinine Clearance: 64.8 mL/min (A) (by C-G formula based on SCr of 1.2 mg/dL (  H)). Liver Function Tests: No results for input(s): "AST", "ALT", "ALKPHOS", "BILITOT", "PROT", "ALBUMIN" in the last 168 hours. No results for input(s): "LIPASE", "AMYLASE" in the last 168 hours. No results for input(s):  "AMMONIA" in the last 168 hours. Coagulation Profile: No results for input(s): "INR", "PROTIME" in the last 168 hours. Cardiac Enzymes: No results for input(s): "CKTOTAL", "CKMB", "CKMBINDEX", "TROPONINI" in the last 168 hours. BNP (last 3 results) No results for input(s): "PROBNP" in the last 8760 hours. HbA1C: No results for input(s): "HGBA1C" in the last 72 hours. CBG: Recent Labs  Lab 07/29/23 0016 07/29/23 0802 07/29/23 0920  GLUCAP 90 76 98   Lipid Profile: No results for input(s): "CHOL", "HDL", "LDLCALC", "TRIG", "CHOLHDL", "LDLDIRECT" in the last 72 hours. Thyroid Function Tests: No results for input(s): "TSH", "T4TOTAL", "FREET4", "T3FREE", "THYROIDAB" in the last 72 hours. Anemia Panel: No results for input(s): "VITAMINB12", "FOLATE", "FERRITIN", "TIBC", "IRON", "RETICCTPCT" in the last 72 hours. Sepsis Labs: No results for input(s): "PROCALCITON", "LATICACIDVEN" in the last 168 hours.  No results found for this or any previous visit (from the past 240 hour(s)).       Radiology Studies: DG Ankle Complete Right  Result Date: 07/28/2023 CLINICAL DATA:  Weakness, fall EXAM: RIGHT ANKLE - COMPLETE 3+ VIEW COMPARISON:  None Available. FINDINGS: No fracture or dislocation is seen. The ankle mortise is intact. Mild soft tissue swelling/edema along the dorsum of the foot. Small plantar calcaneal enthesophyte. IMPRESSION: Negative. Electronically Signed   By: Charline Bills M.D.   On: 07/28/2023 22:01   DG Ankle Complete Left  Result Date: 07/28/2023 CLINICAL DATA:  Weakness, fall EXAM: LEFT ANKLE COMPLETE - 3+ VIEW COMPARISON:  None Available. FINDINGS: No fracture or dislocation is seen. The ankle mortise is intact. The base of the fifth metatarsal is unremarkable. Visualized soft tissues are within normal limits. Small plantar calcaneal enthesophyte. IMPRESSION: Negative. Electronically Signed   By: Charline Bills M.D.   On: 07/28/2023 22:00   DG Lumbar Spine  Complete  Result Date: 07/28/2023 CLINICAL DATA:  Weakness, fall EXAM: LUMBAR SPINE - COMPLETE 4+ VIEW COMPARISON:  CT abdomen/pelvis dated 07/15/2023 FINDINGS: Five lumbar-type vertebral bodies. Normal lumbar lordosis. No evidence of fracture or dislocation. Vertebral body heights and intervertebral disc spaces are maintained. Visualized bony pelvis is intact. Vascular calcifications. IMPRESSION: Negative. Electronically Signed   By: Charline Bills M.D.   On: 07/28/2023 22:00   DG Pelvis 1-2 Views  Result Date: 07/28/2023 CLINICAL DATA:  Weakness, fall EXAM: PELVIS - 1-2 VIEW COMPARISON:  None Available. FINDINGS: No fracture or dislocation is seen. Bilateral hip spaces are preserved. Visualized bony pelvis appears intact. IMPRESSION: Negative. Electronically Signed   By: Charline Bills M.D.   On: 07/28/2023 21:59   DG Knee Complete 4 Views Left  Result Date: 07/28/2023 CLINICAL DATA:  Weakness, fall EXAM: LEFT KNEE - COMPLETE 4+ VIEW COMPARISON:  None Available. FINDINGS: No fracture or dislocation is seen. Mild to moderate degenerative changes, most prominent in the medial compartment. Visualized soft tissues are within normal limits. No suprapatellar knee joint effusion. IMPRESSION: No fracture or dislocation is seen. Mild to moderate degenerative changes. Electronically Signed   By: Charline Bills M.D.   On: 07/28/2023 21:59   DG Knee Complete 4 Views Right  Result Date: 07/28/2023 CLINICAL DATA:  Weakness, fall EXAM: RIGHT KNEE - COMPLETE 4+ VIEW COMPARISON:  None Available. FINDINGS: Moderate tricompartmental degenerative changes, most prominent in the medial compartment. No fracture or dislocation is seen. Visualized  soft tissues are within normal limits. No suprapatellar knee joint effusion. IMPRESSION: No fracture or dislocation is seen. Moderate degenerative changes. Electronically Signed   By: Charline Bills M.D.   On: 07/28/2023 21:58        Scheduled Meds:  citalopram  20  mg Oral QHS   cyanocobalamin  1,000 mcg Oral Daily   insulin aspart  0-15 Units Subcutaneous TID WC   insulin aspart  0-5 Units Subcutaneous QHS   metoprolol succinate  25 mg Oral Daily   pantoprazole  40 mg Oral Daily   pramipexole  0.5 mg Oral TID   [START ON 07/30/2023] psyllium  1 packet Oral QHS   spironolactone  12.5 mg Oral Daily   torsemide  40 mg Oral Daily   Continuous Infusions:        Glade Lloyd, MD [facility] 07/29/2023, 10:59 AM

## 2023-07-29 NOTE — H&P (Incomplete)
PCP:   Ignatius Specking, NP   Chief Complaint:  ***  HPI:  3-4 wks has to rock back and fort in order to get up and when up her legs are so weak  She know she arthritis in back, hip, knee. R knee bone on knee.  Pain doc wants to do pain stimulator  Increasing pain in back/neck/knee  Burning sole of feet B/L. Started on gabapentin. Helped/ Radicular sx on the RLE. Couple weeks.  Use cane and walker depending. When out uses a rolator  No h/o MRI back   [place] today. Mechanical fall. Not able to get up.   Review of Systems:  The patient denies anorexia, fever, weight loss,, vision loss, decreased hearing, hoarseness, chest pain, syncope, dyspnea on exertion, peripheral edema, balance deficits, hemoptysis, abdominal pain, melena, hematochezia, severe indigestion/heartburn, hematuria, incontinence, genital sores, muscle weakness, suspicious skin lesions, transient blindness, difficulty walking, depression, unusual weight change, abnormal bleeding, enlarged lymph nodes, angioedema, and breast masses.  Past Medical History: Past Medical History:  Diagnosis Date  . Acute lymphadenitis 2011  . ALLERGIC RHINITIS 08/10/2007  . ANXIETY 12/06/2007  . Atherosclerotic peripheral vascular disease (HCC) 06/13/2013   Aorta on CT June 2014  . Cellulitis 2011  . Cervical disc disease 03/09/2012  . CHF (congestive heart failure) (HCC)   . Chronic pain 03/09/2012  . DEPRESSION 12/06/2007  . Diabetes (HCC)   . DIVERTICULOSIS, [place] 12/06/2007  . GERD 12/06/2007  . Hepatitis    age 20 hepatitis A  . HLD (hyperlipidemia) 05/24/2019  . HYPERTENSION 12/06/2007  . Lumbar disc disease 03/09/2012  . Mesenteric adenitis   . MRSA 2006  . 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 THORACIC/LUMBOSACRAL NEURITIS/RADICULITIS UNSPEC 12/28/2008   Past Surgical History:  Procedure Laterality Date  . ABDOMINAL HYSTERECTOMY  1999   1 ovary left  . bloo clot removed from neck   may 25th , june 2. 2010  .  CERVICAL DISC SURGERY  may 24th 2010  . COLONOSCOPY WITH PROPOFOL N/A 07/10/2016   Procedure: COLONOSCOPY WITH PROPOFOL;  Surgeon: Ruffin Frederick, MD;  Location: [facility];  Service: Gastroenterology;  Laterality: N/A;  . COLONOSCOPY WITH PROPOFOL N/A 09/26/2019   Procedure: COLONOSCOPY WITH PROPOFOL;  Surgeon: Benancio Deeds, MD;  Location: [facility];  Service: Gastroenterology;  Laterality: N/A;  . ESOPHAGOGASTRODUODENOSCOPY (EGD) WITH PROPOFOL N/A 07/10/2016   Procedure: ESOPHAGOGASTRODUODENOSCOPY (EGD) WITH PROPOFOL;  Surgeon: Ruffin Frederick, MD;  Location: [facility];  Service: Gastroenterology;  Laterality: N/A;  . POLYPECTOMY  09/26/2019   Procedure: POLYPECTOMY;  Surgeon: Benancio Deeds, MD;  Location: [facility];  Service: Gastroenterology;;  . s/p ovary cyst    . s/p right knee arthroscopy     Dr. Dion Saucier ortho    Medications: Prior to Admission medications   Medication Sig Start Date End Date Taking? Authorizing Provider  albuterol (PROAIR HFA) 108 (90 Base) MCG/ACT inhaler Inhale 2 puffs into the lungs every 6 (six) hours as needed for wheezing or shortness of breath. 11/05/20   Terressa Koyanagi, DO  Blood Glucose Monitoring Suppl (ACCU-CHEK GUIDE) w/Device KIT Use as directed Patient taking differently: 1 each by Other route as directed. 04/11/21   Zigmund Daniel., MD  cholecalciferol (VITAMIN D3) 25 MCG (1000 UNIT) tablet Take 2,000 Units by mouth daily.    [provider]  citalopram (CELEXA) 20 MG tablet Take 1 tablet (20 mg total) by mouth daily. 11/02/22   Corwin Levins, MD  clonazePAM Scarlette Calico)  1 MG tablet TAKE ONE TABLET BY MOUTH TWICE DAILY AS NEEDED FOR ANXIETY Patient taking differently: Take 1 mg by mouth 2 (two) times daily as needed for anxiety. for anxiety 06/08/23   Corwin Levins, MD  clotrimazole (LOTRIMIN) 1 % cream Apply topically 2 (two) times daily. 02/12/23   Crissie Sickles, MD  cyanocobalamin 1000 MCG tablet Take  1 tablet (1,000 mcg total) by mouth daily. 07/18/23   Alicia Amel, MD  fluticasone (FLONASE) 50 MCG/ACT nasal spray Place 1 spray into both nostrils daily. 12/07/22   [provider]  glucose blood (ACCU-CHEK GUIDE) test strip Use as instructed up to 4 times daily 04/11/21   Zigmund Daniel., MD  ipratropium-albuterol (DUONEB) 0.5-2.5 (3) MG/3ML SOLN Inhale 3 mLs into the lungs every 6 (six) hours for 5 days. Patient not taking: Reported on 07/16/2023 02/12/23 02/17/23  Crissie Sickles, MD  Metamucil Fiber CHEW Chew 3 tablets by mouth at bedtime.    [provider]  metoprolol succinate (TOPROL-XL) 25 MG 24 hr tablet Take 1 tablet (25 mg total) by mouth daily. 07/18/23   Alicia Amel, MD  MOUNJARO 10 MG/0.5ML Pen Inject 10 mg into the skin once a week. 06/29/23   [provider]  NARCAN 4 MG/0.1ML LIQD nasal spray kit Place 0.4 mg into the nose daily as needed (opioid overdose). 04/25/19   [provider]  ondansetron (ZOFRAN ODT) 4 MG disintegrating tablet Take 1 tablet (4 mg total) by mouth every 4 (four) hours as needed for nausea or vomiting. 08/23/19   Arby Barrette, MD  Oxycodone HCl 10 MG TABS Take 1 tablet (10 mg total) 4 (four) times daily by mouth. Per [facility] Patient taking differently: Take 10 mg by mouth every 4 (four) hours as needed (pain). Per [facility] 11/08/17   Corwin Levins, MD  pantoprazole (PROTONIX) 40 MG tablet TAKE ONE TABLET BY MOUTH DAILY Patient taking differently: Take 40 mg by mouth daily. 01/29/22   Corwin Levins, MD  potassium chloride SA (KLOR-CON M) 20 MEQ tablet Take 2 tablets (40 mEq total) by mouth 2 (two) times daily. 07/17/23   Cyndia Skeeters, DO  pramipexole (MIRAPEX) 0.5 MG tablet Take 1 tablet (0.5 mg total) by mouth 3 (three) times daily. 06/24/22   Corwin Levins, MD  senna-docusate (SENOKOT-S) 8.6-50 MG tablet Take 1 tablet by mouth at bedtime as needed for mild constipation. Patient not taking:  Reported on 07/16/2023 02/12/23   Crissie Sickles, MD  spironolactone (ALDACTONE) 25 MG tablet Take 0.5 tablets (12.5 mg total) by mouth daily. Patient taking differently: Take 25 mg by mouth daily. 08/14/22   Sharlene Dory, NP  torsemide (DEMADEX) 20 MG tablet Take 2 tablets (40 mg total) by mouth daily. 07/17/23   Alicia Amel, MD  DULoxetine (CYMBALTA) 60 MG capsule Take 60 mg by mouth daily.    03/09/12  [provider]    Allergies:   Allergies  Allergen Reactions  . Amoxicillin-Pot Clavulanate Nausea And Vomiting    Projectile vomiting Did it involve swelling of the face/tongue/throat, SOB, or low BP? No Did it involve sudden or severe rash/hives, skin peeling, or any reaction on the inside of your mouth or nose? No Did you need to seek medical attention at a hospital or doctor's office? No When did it last happen?      20-30 years If all above answers are "NO", may proceed with cephalosporin use.   Virgina Organ [  Tape] Other (See Comments)    Tears skin off - use paper tape   . Codeine Other (See Comments)    hallucinations  . Crestor [Rosuvastatin Calcium]     Severe muscle cramps  . Lyrica [Pregabalin] Other (See Comments)    weakness  . Morphine And Codeine Nausea And Vomiting and Other (See Comments)    Migraine headaches  . Vicodin [Hydrocodone-Acetaminophen] Other (See Comments)    hallucinations  . Latex Rash    Social History:  reports that she quit smoking about 14 years ago. Her smoking use included cigarettes. She started smoking about 44 years ago. She has never used smokeless tobacco. She reports that she does not drink alcohol and does not use drugs.  Family History: Family History  Problem Relation Age of Onset  . Heart disease Mother   . Hypertension Mother   . Diabetes Mother   . Heart failure Mother   . Asthma Sister   . Anxiety disorder Sister   . Depression Sister   . Hypertension Father   . Asthma Daughter   . Bipolar disorder  Daughter   . Cancer Maternal Uncle        Varas  . Cancer Other        ovarian  . Liver cancer Paternal Grandmother        ????    Physical Exam: Vitals:   07/28/23 2035 07/28/23 2036  BP: 138/82   Pulse: 76   Resp: 20   Temp: 98 F (36.7 C)   TempSrc: Oral   SpO2: 97%   Weight:  (!) 143.8 kg  Height:  5\' 3"  (1.6 m)    General:  Alert and oriented times three, well developed and nourished, no acute distress Eyes: PERRLA, pink conjunctiva, no scleral icterus ENT: Moist oral mucosa, neck supple, no thyromegaly Lungs: clear to ascultation, no wheeze, no crackles, no use of accessory muscles Cardiovascular: regular rate and rhythm, no regurgitation, no gallops, no murmurs. No carotid bruits, no JVD Abdomen: soft, positive BS, non-tender, non-distended, no organomegaly, not an acute abdomen GU: not examined Neuro: CN II - XII grossly intact, sensation intact Musculoskeletal: strength 5/5 all extremities, no clubbing, cyanosis or edema Skin: no rash, no subcutaneous crepitation, no decubitus Psych: appropriate patient   Labs on Admission:  Recent Labs    07/28/23 2232  NA 135  K 4.6  CL 103  CO2 24  GLUCOSE 79  BUN 9  CREATININE 1.20*  CALCIUM 8.8*   Recent Labs    07/28/23 2232  WBC 8.0  NEUTROABS 4.7  HGB 10.9*  HCT 34.7*  MCV 91.8  PLT 257    Radiological Exams on Admission: DG Ankle Complete Right  Result Date: 07/28/2023 CLINICAL DATA:  Weakness, fall EXAM: RIGHT ANKLE - COMPLETE 3+ VIEW COMPARISON:  None Available. FINDINGS: No fracture or dislocation is seen. The ankle mortise is intact. Mild soft tissue swelling/edema along the dorsum of the foot. Small plantar calcaneal enthesophyte. IMPRESSION: Negative. Electronically Signed   By: Charline Bills M.D.   On: 07/28/2023 22:01   DG Ankle Complete Left  Result Date: 07/28/2023 CLINICAL DATA:  Weakness, fall EXAM: LEFT ANKLE COMPLETE - 3+ VIEW COMPARISON:  None Available. FINDINGS: No fracture or  dislocation is seen. The ankle mortise is intact. The base of the fifth metatarsal is unremarkable. Visualized soft tissues are within normal limits. Small plantar calcaneal enthesophyte. IMPRESSION: Negative. Electronically Signed   By: Charline Bills M.D.   On: 07/28/2023 22:00  DG Lumbar Spine Complete  Result Date: 07/28/2023 CLINICAL DATA:  Weakness, fall EXAM: LUMBAR SPINE - COMPLETE 4+ VIEW COMPARISON:  CT abdomen/pelvis dated 07/15/2023 FINDINGS: Five lumbar-type vertebral bodies. Normal lumbar lordosis. No evidence of fracture or dislocation. Vertebral body heights and intervertebral disc spaces are maintained. Visualized bony pelvis is intact. Vascular calcifications. IMPRESSION: Negative. Electronically Signed   By: Charline Bills M.D.   On: 07/28/2023 22:00   DG Pelvis 1-2 Views  Result Date: 07/28/2023 CLINICAL DATA:  Weakness, fall EXAM: PELVIS - 1-2 VIEW COMPARISON:  None Available. FINDINGS: No fracture or dislocation is seen. Bilateral hip spaces are preserved. Visualized bony pelvis appears intact. IMPRESSION: Negative. Electronically Signed   By: Charline Bills M.D.   On: 07/28/2023 21:59   DG Knee Complete 4 Views Left  Result Date: 07/28/2023 CLINICAL DATA:  Weakness, fall EXAM: LEFT KNEE - COMPLETE 4+ VIEW COMPARISON:  None Available. FINDINGS: No fracture or dislocation is seen. Mild to moderate degenerative changes, most prominent in the medial compartment. Visualized soft tissues are within normal limits. No suprapatellar knee joint effusion. IMPRESSION: No fracture or dislocation is seen. Mild to moderate degenerative changes. Electronically Signed   By: Charline Bills M.D.   On: 07/28/2023 21:59   DG Knee Complete 4 Views Right  Result Date: 07/28/2023 CLINICAL DATA:  Weakness, fall EXAM: RIGHT KNEE - COMPLETE 4+ VIEW COMPARISON:  None Available. FINDINGS: Moderate tricompartmental degenerative changes, most prominent in the medial compartment. No fracture or  dislocation is seen. Visualized soft tissues are within normal limits. No suprapatellar knee joint effusion. IMPRESSION: No fracture or dislocation is seen. Moderate degenerative changes. Electronically Signed   By: Charline Bills M.D.   On: 07/28/2023 21:58    Assessment/Plan Present on Admission: . Fall -  . (HFpEF) heart failure with preserved ejection fraction (HCC) -  . Anxiety state -  . Chronic right-sided congestive heart failure (HCC) . Chronic pain syndrome -  . HLD (hyperlipidemia) -  . HTN (hypertension) -  . Morbid obesity due to excess calories (HCC) -  RLS -  . Narcotic dependence (HCC) -  . T2DM -on [place] on Tuesdays  ,  07/28/2023, 11:46 PM

## 2023-07-29 NOTE — ED Notes (Signed)
ED TO INPATIENT HANDOFF REPORT  ED Nurse Name and Phone #: [phone]  S Name/Age/Gender Maria [name] A Maria [name] 67 y.o. female Room/Bed: 007C/007C  Code Status   Code Status: Prior  Home/SNF/Other Home Patient oriented to: self, place, time, and situation Is this baseline? Yes   Triage Complete: Triage complete  Chief Complaint Fall [W19.XXXA]  Triage Note Pt from home by ambulance. Pt states difficulty walking for 4 weeks. Today at dinner she went to get up and fell to both knees. No thinners, no loc, no dizziness, did not hit head. Pain 9/10. Usus a walker to get around at home. [id number]   Allergies Allergies  Allergen Reactions   Amoxicillin-Pot Clavulanate Nausea And Vomiting    Projectile vomiting Did it involve swelling of the face/tongue/throat, SOB, or low BP? No Did it involve sudden or severe rash/hives, skin peeling, or any reaction on the inside of your mouth or nose? No Did you need to seek medical attention at a hospital or doctor's office? No When did it last happen?      20-30 years If all above answers are "NO", may proceed with cephalosporin use.    Adhesive [Tape] Other (See Comments)    Tears skin off - use paper tape    Codeine Other (See Comments)    hallucinations   Crestor [Rosuvastatin Calcium]     Severe muscle cramps   Lyrica [Pregabalin] Other (See Comments)    weakness   Morphine And Codeine Nausea And Vomiting and Other (See Comments)    Migraine headaches   Vicodin [Hydrocodone-Acetaminophen] Other (See Comments)    hallucinations   Latex Rash    Level of Care/Admitting Diagnosis ED Disposition     ED Disposition  Admit   Condition  --   Comment  Hospital Area: [facility] [[id number]]  Level of Care: Telemetry Medical [104]  May place patient in observation at [facility] or [facility] if equivalent level of care is available:: No  Covid Evaluation: Asymptomatic - no recent exposure (last 10 days) testing not required   Diagnosis: Fall [290176]  Admitting Physician: Gery Pray [phone]  Attending Physician: Alvester Chou          B Medical/Surgery History Past Medical History:  Diagnosis Date   Acute lymphadenitis 2011   ALLERGIC RHINITIS 08/10/2007   ANXIETY 12/06/2007   Atherosclerotic peripheral vascular disease (HCC) 06/13/2013   Aorta on CT June 2014   Cellulitis 2011   Cervical disc disease 03/09/2012   CHF (congestive heart failure) (HCC)    Chronic pain 03/09/2012   DEPRESSION 12/06/2007   Diabetes (HCC)    DIVERTICULOSIS, [place] 12/06/2007   GERD 12/06/2007   Hepatitis    age 29 hepatitis A   HLD (hyperlipidemia) 05/24/2019   HYPERTENSION 12/06/2007   Lumbar disc disease 03/09/2012   Mesenteric adenitis    MRSA 2006   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 2010   CERVICAL DISC SURGERY  may 24th 2010   COLONOSCOPY WITH PROPOFOL N/A 07/10/2016   Procedure: COLONOSCOPY WITH PROPOFOL;  Surgeon: Ruffin Frederick, MD;  Location: [facility];  Service: Gastroenterology;  Laterality: N/A;   COLONOSCOPY WITH PROPOFOL N/A 09/26/2019   Procedure: COLONOSCOPY WITH PROPOFOL;  Surgeon: Benancio Deeds, MD;  Location: [facility];  Service: Gastroenterology;  Laterality: N/A;   ESOPHAGOGASTRODUODENOSCOPY (EGD) WITH PROPOFOL N/A 07/10/2016   Procedure: ESOPHAGOGASTRODUODENOSCOPY (EGD) WITH PROPOFOL;  Surgeon: Ruffin Frederick, MD;  Location: [facility];  Service: Gastroenterology;  Laterality: N/A;   POLYPECTOMY  09/26/2019   Procedure: POLYPECTOMY;  Surgeon: Benancio Deeds, MD;  Location: [facility];  Service: Gastroenterology;;   s/p ovary cyst     s/p right knee arthroscopy     Dr. Dion Saucier ortho     A IV Location/Drains/Wounds Patient  Lines/Drains/Airways Status     Active Line/Drains/Airways     Name Placement date Placement time Site Days   Peripheral IV 07/28/23 20 G Right Antecubital 07/28/23  2345  Antecubital  1   Wound / Incision (Open or Dehisced) 02/08/23 Irritant Dermatitis (Moisture Associated Skin Damage) Buttocks Medial 02/08/23  0120  Buttocks  171   Wound / Incision (Open or Dehisced) 02/08/23 Other (Comment) Pretibial Right 02/08/23  0635  Pretibial  171   Wound / Incision (Open or Dehisced) 02/08/23 Other (Comment) Pretibial Left 02/08/23  0636  Pretibial  171            Intake/Output Last 24 hours No intake or output data in the 24 hours ending 07/29/23 0807  Labs/Imaging Results for orders placed or performed during the hospital encounter of 07/28/23 (from the past 48 hour(s))  CBC with Differential/Platelet     Status: Abnormal   Collection Time: 07/28/23 10:32 PM  Result Value Ref Range   WBC 8.0 4.0 - 10.5 K/uL   RBC 3.78 (L) 3.87 - 5.11 MIL/uL   Hemoglobin 10.9 (L) 12.0 - 15.0 g/dL   HCT 16.1 (L) 09.6 - 04.5 %   MCV 91.8 80.0 - 100.0 fL   MCH 28.8 26.0 - 34.0 pg   MCHC 31.4 30.0 - 36.0 g/dL   RDW 40.9 81.1 - 91.4 %   Platelets 257 150 - 400 K/uL   nRBC 0.0 0.0 - 0.2 %   Neutrophils Relative % 58 %   Neutro Abs 4.7 1.7 - 7.7 K/uL   Lymphocytes Relative 30 %   Lymphs Abs 2.4 0.7 - 4.0 K/uL   Monocytes Relative 8 %   Monocytes Absolute 0.6 0.1 - 1.0 K/uL   Eosinophils Relative 3 %   Eosinophils Absolute 0.2 0.0 - 0.5 K/uL   Basophils Relative 1 %   Basophils Absolute 0.1 0.0 - 0.1 K/uL   Immature Granulocytes 0 %   Abs Immature Granulocytes 0.01 0.00 - 0.07 K/uL    Comment: Performed at [facility], [address] [place] [employer] [place], [place] [address]  Basic metabolic panel     Status: Abnormal   Collection Time: 07/28/23 10:32 PM  Result Value Ref Range   Sodium 135 135 - 145 mmol/L   Potassium 4.6 3.5 - 5.1 mmol/L   Chloride 103 98 - 111 mmol/L   CO2 24 22 - 32 mmol/L    Glucose, Bld 79 70 - 99 mg/dL    Comment: Glucose reference range applies only to samples taken after fasting for at least 8 hours.   BUN 9 8 - 23 mg/dL   Creatinine, Ser 6.21 (H) 0.44 - 1.00 mg/dL   Calcium 8.8 (L) 8.9 - 10.3 mg/dL   GFR, Estimated 50 (L) >60 mL/min    Comment: (NOTE) Calculated using the CKD-EPI Creatinine Equation (2021)    Anion gap 8 5 - 15    Comment: Performed at [facility], [address] [place], [place] [address]  CBG monitoring, ED     Status: None   Collection Time: 07/29/23 12:16 AM  Result Value Ref Range   Glucose-Capillary 90 70 - 99 mg/dL    Comment: Glucose reference range applies only to samples taken after fasting for at least 8 hours.  CBG monitoring, ED     Status: None   Collection Time: 07/29/23  8:02 AM  Result Value Ref Range   Glucose-Capillary 76 70 - 99 mg/dL    Comment: Glucose reference range applies only to samples taken after fasting for at least 8 hours.   DG Ankle Complete Right  Result Date: 07/28/2023 CLINICAL DATA:  Weakness, fall EXAM: RIGHT ANKLE - COMPLETE 3+ VIEW COMPARISON:  None Available. FINDINGS: No fracture or dislocation is seen. The ankle mortise is intact. Mild soft tissue swelling/edema along the dorsum of the foot. Small plantar calcaneal enthesophyte. IMPRESSION: Negative. Electronically Signed   By: Charline Bills M.D.   On: 07/28/2023 22:01   DG Ankle Complete Left  Result Date: 07/28/2023 CLINICAL DATA:  Weakness, fall EXAM: LEFT ANKLE COMPLETE - 3+ VIEW COMPARISON:  None Available. FINDINGS: No fracture or dislocation is seen. The ankle mortise is intact. The base of the fifth metatarsal is unremarkable. Visualized soft tissues are within normal limits. Small plantar calcaneal enthesophyte. IMPRESSION: Negative. Electronically Signed   By: Charline Bills M.D.   On: 07/28/2023 22:00   DG Lumbar Spine Complete  Result Date: 07/28/2023 CLINICAL DATA:  Weakness, fall EXAM: LUMBAR SPINE - COMPLETE 4+  VIEW COMPARISON:  CT abdomen/pelvis dated 07/15/2023 FINDINGS: Five lumbar-type vertebral bodies. Normal lumbar lordosis. No evidence of fracture or dislocation. Vertebral body heights and intervertebral disc spaces are maintained. Visualized bony pelvis is intact. Vascular calcifications. IMPRESSION: Negative. Electronically Signed   By: Charline Bills M.D.   On: 07/28/2023 22:00   DG Pelvis 1-2 Views  Result Date: 07/28/2023 CLINICAL DATA:  Weakness, fall EXAM: PELVIS - 1-2 VIEW COMPARISON:  None Available. FINDINGS: No fracture or dislocation is seen. Bilateral hip spaces are preserved. Visualized bony pelvis appears intact. IMPRESSION: Negative. Electronically Signed   By: Charline Bills M.D.   On: 07/28/2023 21:59   DG Knee Complete 4 Views Left  Result Date: 07/28/2023 CLINICAL DATA:  Weakness, fall EXAM: LEFT KNEE - COMPLETE 4+ VIEW COMPARISON:  None Available. FINDINGS: No fracture or dislocation is seen. Mild to moderate degenerative changes, most prominent in the medial compartment. Visualized soft tissues are within normal limits. No suprapatellar knee joint effusion. IMPRESSION: No fracture or dislocation is seen. Mild to moderate degenerative changes. Electronically Signed   By: Charline Bills M.D.   On: 07/28/2023 21:59   DG Knee Complete 4 Views Right  Result Date: 07/28/2023 CLINICAL DATA:  Weakness, fall EXAM: RIGHT KNEE - COMPLETE 4+ VIEW COMPARISON:  None Available. FINDINGS: Moderate tricompartmental degenerative changes, most prominent in the medial compartment. No fracture or dislocation is seen. Visualized soft tissues are within normal limits. No suprapatellar knee joint effusion. IMPRESSION: No fracture or dislocation is seen. Moderate degenerative changes. Electronically Signed   By: Charline Bills M.D.   On: 07/28/2023 21:58    Pending Labs Unresulted Labs (From admission, onward)    None       Vitals/Pain Today's Vitals   07/29/23 0523 07/29/23 0600  07/29/23 0700 07/29/23 0806  BP: 138/64 120/67 128/64   Pulse: 69 66 71   Resp: 16 16    Temp:    98.9 F (  37.2 C)  TempSrc:    Oral  SpO2: 95% 93% 95%   Weight:      Height:      PainSc: 4  1       Isolation Precautions No active isolations  Medications Medications  citalopram (CELEXA) tablet 20 mg (20 mg Oral Given 07/29/23 0018)  cyanocobalamin (VITAMIN B12) tablet 1,000 mcg (has no administration in time range)  psyllium (HYDROCIL/METAMUCIL) 1 packet (has no administration in time range)  metoprolol succinate (TOPROL-XL) 24 hr tablet 25 mg (has no administration in time range)  oxyCODONE (Oxy IR/ROXICODONE) immediate release tablet 10 mg (10 mg Oral Given 07/29/23 0508)  pantoprazole (PROTONIX) EC tablet 40 mg (has no administration in time range)  pramipexole (MIRAPEX) tablet 0.5 mg (has no administration in time range)  spironolactone (ALDACTONE) tablet 12.5 mg (has no administration in time range)  torsemide (DEMADEX) tablet 40 mg (has no administration in time range)  insulin aspart (novoLOG) injection 0-15 Units ( Subcutaneous Not Given 07/29/23 0806)  insulin aspart (novoLOG) injection 0-5 Units ( Subcutaneous Not Given 07/29/23 0018)  diazepam (VALIUM) tablet 5 mg (5 mg Oral Given 07/28/23 2051)  oxyCODONE-acetaminophen (PERCOCET/ROXICET) 5-325 MG per tablet 1 tablet (1 tablet Oral Given 07/28/23 2051)  morphine (PF) 4 MG/ML injection 8 mg (8 mg Subcutaneous Given 07/28/23 2203)  ondansetron (ZOFRAN-ODT) disintegrating tablet 8 mg (8 mg Oral Given 07/28/23 2203)    Mobility walks with device     Focused Assessments     R Recommendations: See Admitting Provider Note  Report given to:   Additional Notes:

## 2023-07-29 NOTE — ED Notes (Signed)
Spoke with MRI.  Patient scheduled for MRI at 0800 tomorrow morning.   Patient updated on plan.

## 2023-07-29 NOTE — ED Notes (Signed)
Checked patient cbg it was 27 notified RN of blood sugar

## 2023-07-30 ENCOUNTER — Other Ambulatory Visit: Payer: Self-pay

## 2023-07-30 ENCOUNTER — Observation Stay (HOSPITAL_COMMUNITY): Payer: 59

## 2023-07-30 ENCOUNTER — Observation Stay (HOSPITAL_COMMUNITY): Payer: 59 | Admitting: Certified Registered Nurse Anesthetist

## 2023-07-30 ENCOUNTER — Observation Stay (HOSPITAL_BASED_OUTPATIENT_CLINIC_OR_DEPARTMENT_OTHER): Payer: 59 | Admitting: Certified Registered Nurse Anesthetist

## 2023-07-30 ENCOUNTER — Encounter (HOSPITAL_COMMUNITY)
Admission: EM | Disposition: A | Discharge status: Skilled Nursing Facility | Payer: Self-pay | Source: Home / Self Care | Attending: Family Medicine

## 2023-07-30 ENCOUNTER — Encounter (HOSPITAL_COMMUNITY): Payer: Self-pay | Admitting: Family Medicine

## 2023-07-30 DIAGNOSIS — I5033 Acute on chronic diastolic (congestive) heart failure: Secondary | ICD-10-CM

## 2023-07-30 DIAGNOSIS — M545 Low back pain, unspecified: Secondary | ICD-10-CM | POA: Diagnosis not present

## 2023-07-30 DIAGNOSIS — G8929 Other chronic pain: Secondary | ICD-10-CM

## 2023-07-30 DIAGNOSIS — I130 Hypertensive heart and chronic kidney disease with heart failure and stage 1 through stage 4 chronic kidney disease, or unspecified chronic kidney disease: Secondary | ICD-10-CM

## 2023-07-30 DIAGNOSIS — N1831 Chronic kidney disease, stage 3a: Secondary | ICD-10-CM

## 2023-07-30 DIAGNOSIS — I83893 Varicose veins of bilateral lower extremities with other complications: Secondary | ICD-10-CM

## 2023-07-30 DIAGNOSIS — R29898 Other symptoms and signs involving the musculoskeletal system: Secondary | ICD-10-CM | POA: Diagnosis not present

## 2023-07-30 HISTORY — PX: RADIOLOGY WITH ANESTHESIA: SHX6223

## 2023-07-30 LAB — GLUCOSE, CAPILLARY
Glucose-Capillary: 76 mg/dL (ref 70–99)
Glucose-Capillary: 87 mg/dL (ref 70–99)
Glucose-Capillary: 84 mg/dL (ref 70–99)
Glucose-Capillary: 80 mg/dL (ref 70–99)
Glucose-Capillary: 109 mg/dL — ABNORMAL HIGH (ref 70–99)
Glucose-Capillary: 110 mg/dL — ABNORMAL HIGH (ref 70–99)

## 2023-07-30 SURGERY — MRI WITH ANESTHESIA
Anesthesia: General

## 2023-07-30 MED ORDER — PROPOFOL 10 MG/ML IV BOLUS
INTRAVENOUS | Status: DC | PRN
  Administered 2023-07-30: 200 mg via INTRAVENOUS

## 2023-07-30 MED ORDER — ORAL CARE MOUTH RINSE: 15.0000 mL | Freq: Once | OROMUCOSAL | Status: AC

## 2023-07-30 MED ORDER — LACTATED RINGERS IV SOLN: INTRAVENOUS | Status: DC

## 2023-07-30 MED ORDER — AMISULPRIDE (ANTIEMETIC) 5 MG/2ML IV SOLN: 10.0000 mg | Freq: Once | INTRAVENOUS | Status: DC | PRN

## 2023-07-30 MED ORDER — CHLORHEXIDINE GLUCONATE 0.12 % MT SOLN: 15.0000 mL | Freq: Once | OROMUCOSAL | Status: AC

## 2023-07-30 MED ORDER — LIDOCAINE 2% (20 MG/ML) 5 ML SYRINGE
INTRAMUSCULAR | Status: DC | PRN
  Administered 2023-07-30: 100 mg via INTRAVENOUS

## 2023-07-30 MED ORDER — ONDANSETRON HCL 4 MG/2ML IJ SOLN
INTRAMUSCULAR | Status: DC | PRN
  Administered 2023-07-30: 4 mg via INTRAVENOUS

## 2023-07-30 MED ORDER — SUCCINYLCHOLINE CHLORIDE 200 MG/10ML IV SOSY
PREFILLED_SYRINGE | INTRAVENOUS | Status: DC | PRN
  Administered 2023-07-30: 160 mg via INTRAVENOUS

## 2023-07-30 MED ORDER — LACTATED RINGERS IV SOLN: INTRAVENOUS | Status: DC | PRN

## 2023-07-30 MED ORDER — ACETAMINOPHEN 325 MG PO TABS
650.0000 mg | ORAL_TABLET | Freq: Four times a day (QID) | ORAL | Status: DC | PRN
  Administered 2023-08-02 – 2023-08-03 (×4): 650 mg via ORAL
  Filled 2023-07-30 (×4): qty 2

## 2023-07-30 MED ORDER — HYDROMORPHONE HCL 2 MG PO TABS
1.0000 mg | ORAL_TABLET | Freq: Once | ORAL | Status: AC
  Administered 2023-07-30: 1 mg via ORAL
  Filled 2023-07-30: qty 1

## 2023-07-30 MED ORDER — CHLORHEXIDINE GLUCONATE 0.12 % MT SOLN
OROMUCOSAL | Status: AC
  Administered 2023-07-30: 15 mL via OROMUCOSAL
  Filled 2023-07-30: qty 15

## 2023-07-30 MED ORDER — ROCURONIUM BROMIDE 10 MG/ML (PF) SYRINGE
PREFILLED_SYRINGE | INTRAVENOUS | Status: DC | PRN
  Administered 2023-07-30: 50 mg via INTRAVENOUS

## 2023-07-30 MED ORDER — GABAPENTIN 400 MG PO CAPS
400.0000 mg | ORAL_CAPSULE | Freq: Every day | ORAL | Status: DC
  Administered 2023-07-30: 400 mg via ORAL
  Filled 2023-07-30: qty 1

## 2023-07-30 MED ORDER — SUGAMMADEX SODIUM 200 MG/2ML IV SOLN
INTRAVENOUS | Status: DC | PRN
  Administered 2023-07-30: 400 mg via INTRAVENOUS

## 2023-07-30 MED ORDER — METOPROLOL SUCCINATE ER 25 MG PO TB24
25.0000 mg | ORAL_TABLET | Freq: Once | ORAL | Status: AC
  Administered 2023-07-30: 25 mg via ORAL

## 2023-07-30 NOTE — Evaluation (Signed)
Occupational Therapy Evaluation Patient Details Name: Maria [name] MRN: [id number] DOB: 12-Jul-1956 Today's Date: 07/30/2023   History of Present Illness Pt is 67 yo female presenting to [facility] with intractable pain s/p fall at home. MRI performed on 8/9 under sedation. PMH: Anxiety, depression, DM II, HLD, HTN, HFrEF, GERD, chrnoic arthritis and chronic pain on narcotics, LE weakness with radicular symptoms chronically.   Clinical Impression   PTA, pt lives alone, typically Modified Independent with ADLs, household IADLs and mobility using RW. Pt presents now with deficits in strength, standing balance and pain (primarily around back of L knee). Pt requires Min A for UB ADL and around Mod A for LB ADLs. Pt requires Mod A x 2 for standing/side steps at bedside using RW, limited by pain. Patient will benefit from continued inpatient follow up therapy, <3 hours/day if pain and current assist levels persist. Pt hopeful that pain will improve during admission to allow for DC home with [facility] therapies.       If plan is discharge home, recommend the following: A lot of help with walking and/or transfers;A lot of help with bathing/dressing/bathroom    Functional Status Assessment  Patient has had a recent decline in their functional status and demonstrates the ability to make significant improvements in function in a reasonable and predictable amount of time.  Equipment Recommendations  None recommended by OT    Recommendations for Other Services       Precautions / Restrictions Precautions Precautions: Fall Restrictions Weight Bearing Restrictions: No      Mobility Bed Mobility Overal bed mobility: Needs Assistance Bed Mobility: Sit to Supine, Supine to Sit     Supine to sit: Min assist, HOB elevated, Used rails Sit to supine: Mod assist   General bed mobility comments: some assist to scoot hips to EOB, use of bedrails. Mod A for BLE back to bed    Transfers Overall transfer level:  Needs assistance Equipment used: Rolling walker (2 wheels) Transfers: Sit to/from Stand Sit to Stand: Mod assist, +2 physical assistance, +2 safety/equipment           General transfer comment: Mod A x 2 for standing at bedside and side steps with RW. limited by pain in L knee      Balance Overall balance assessment: Needs assistance Sitting-balance support: No upper extremity supported, Feet supported Sitting balance-Leahy Scale: Fair     Standing balance support: Bilateral upper extremity supported, During functional activity Standing balance-Leahy Scale: Poor                             ADL either performed or assessed with clinical judgement   ADL Overall ADL's : Needs assistance/impaired Eating/Feeding: Independent   Grooming: Set up;Sitting   Upper Body Bathing: Minimal assistance;Sitting   Lower Body Bathing: Moderate assistance;Sit to/from stand   Upper Body Dressing : Minimal assistance;Sitting   Lower Body Dressing: Moderate assistance;Sitting/lateral leans;Sit to/from stand;With adaptive equipment       Toileting- Clothing Manipulation and Hygiene: Moderate assistance;Sitting/lateral lean;Sit to/from stand         General ADL Comments: Limited by pain s/p fall at home. educated on use of RW sideways into kitchen and or placing a chair in kitchen to sit/rest prior to exiting to decrease fall risk. also having a chair in the pathway to alllow seated rest breaks     Vision Baseline Vision/History: 1 Wears glasses Ability to See in Adequate Light: 0  Adequate Patient Visual Report: No change from baseline Vision Assessment?: No apparent visual deficits     Perception         Praxis         Pertinent Vitals/Pain Pain Assessment Pain Assessment: Faces Faces Pain Scale: Hurts even more Pain Location: back of L knee Pain Descriptors / Indicators: Sore, Guarding, Grimacing Pain Intervention(s): Monitored during session, Limited activity  within patient's tolerance, Premedicated before session     Extremity/Trunk Assessment Upper Extremity Assessment Upper Extremity Assessment: Generalized weakness   Lower Extremity Assessment Lower Extremity Assessment: Defer to PT evaluation   Cervical / Trunk Assessment Cervical / Trunk Assessment: Other exceptions   Communication Communication Communication: No apparent difficulties   Cognition Arousal: Alert Behavior During Therapy: WFL for tasks assessed/performed Overall Cognitive Status: Within Functional Limits for tasks assessed                                       General Comments  SpO2 RA 85-93%, BP WFL.    Exercises     Shoulder Instructions      Home Living Family/patient expects to be discharged to:: Private residence Living Arrangements: Alone Available Help at Discharge: Family;Friend(s);Available PRN/intermittently Type of Home: Apartment Home Access: Stairs to enter Entrance Stairs-Number of Steps: 6 Entrance Stairs-Rails: Right Home Layout: One level     Bathroom Shower/Tub: Tub/shower unit;Curtain   Bathroom Toilet: Standard Bathroom Accessibility: No   Home Equipment: Rollator (4 wheels);Cane - single point;BSC/3in1;Shower seat;Hand held shower head;Grab bars - tub/shower;Adaptive equipment;Rolling Walker (2 wheels) Adaptive Equipment: Reacher;Sock aid;Long-handled shoe horn;Long-handled sponge        Prior Functioning/Environment Prior Level of Function : Needs assist;History of Falls (last six months)             Mobility Comments: uses RW in house and rollator outside, reliant on support of rail for stairs. reports this fall occurred due to leaving RW in living room and LE giving out when working in the kitchen without AD ADLs Comments: Indep with adaptive equipment, uses shower chair, has groceries delivered, doesn't drive        OT Problem List: Decreased strength;Decreased activity tolerance;Impaired balance  (sitting and/or standing);Pain      OT Treatment/Interventions: Self-care/ADL training;Therapeutic exercise;Energy conservation;DME and/or AE instruction;Therapeutic activities;Patient/family education    OT Goals(Current goals can be found in the care plan section) Acute Rehab OT Goals Patient Stated Goal: not have to go to same rehab place OT Goal Formulation: With patient Time For Goal Achievement: 08/13/23 Potential to Achieve Goals: Good  OT Frequency: Min 1X/week    Co-evaluation              AM-PAC OT "6 Clicks" Daily Activity     Outcome Measure Help from another person eating meals?: None Help from another person taking care of personal grooming?: A Little Help from another person toileting, which includes using toliet, bedpan, or urinal?: A Lot Help from another person bathing (including washing, rinsing, drying)?: A Lot Help from another person to put on and taking off regular upper body clothing?: A Little Help from another person to put on and taking off regular lower body clothing?: A Lot 6 Click Score: 16   End of Session Equipment Utilized During Treatment: Rolling walker (2 wheels);Gait belt;Oxygen  Activity Tolerance: Patient limited by pain Patient left: in bed;with call bell/phone within reach;with bed alarm set  OT Visit  Diagnosis: Unsteadiness on feet (R26.81);Other abnormalities of gait and mobility (R26.89);Muscle weakness (generalized) (M62.81)                Time: 1610-9604 OT Time Calculation (min): 33 min Charges:  OT General Charges $OT Visit: 1 Visit OT Evaluation $OT Eval Moderate Complexity: 1 Mod  Bradd Canary, OTR/L Acute Rehab Services Office: [phone]   Lorre Munroe 07/30/2023, 2:56 PM

## 2023-07-30 NOTE — Hospital Course (Addendum)
Maria [name] is a 67 y.o.female with a history of CHF, chronic low back pain, hypertension, lymphedema who was admitted to the family med teaching Service at [facility] for for a mechanical fall and intractable pain. Her hospital course is detailed below:  Fall/Pain Presented to the ED with lower extremity weakness and radicular symptoms after a mechanical fall at home. x-rays were negative for fracture, and showed moderate degenerative knee changes bilaterally. She required an MRI under anesthesia due to her claustrophobia. MRI showed Mild degenerative changes of the thoracic spine with small disc protrusions at T5-6 and T8-9 and mild bilateral neural foraminal narrowing at T9-10 and T10-11. No high-grade spinal canal or neural foraminal stenosis at any level.  Patient's pain was controlled on IV Dilaudid sparingly. She was mantained on her home oxycodone 10 mg q4h, and increased gabapentin to*** .  PT and OT evaluated throughout admission and recommended*** prior to discharge.    Other chronic conditions were medically managed with home medications and formulary alternatives as necessary (CHF, HTN, anxiety,depression, chronic pain, )  PCP Follow-up Recommendations: Follow-up with PCP about pain  Follow-up about home PT***

## 2023-07-30 NOTE — Anesthesia Postprocedure Evaluation (Signed)
Anesthesia Post Note  Patient: Maria [name]  Procedure(s) Performed: MRI WITH ANESTHESIA - THORACIC LUMBAR WITHOUT CONSTRAST     Patient location during evaluation: PACU Anesthesia Type: General Level of consciousness: awake Pain management: pain level controlled Vital Signs Assessment: post-procedure vital signs reviewed and stable Respiratory status: spontaneous breathing, nonlabored ventilation and respiratory function stable Cardiovascular status: blood pressure returned to baseline and stable Postop Assessment: no apparent nausea or vomiting Anesthetic complications: no   No notable events documented.  Last Vitals:  Vitals:   07/30/23 1100 07/30/23 1115  BP: (!) 104/48 (!) 107/50  Pulse: 78 79  Resp: 12 15  Temp:  36.9 C  SpO2: 95% 96%    Last Pain:  Vitals:   07/30/23 1115  TempSrc:   PainSc: 0-No pain                 Linton Rump

## 2023-07-30 NOTE — Evaluation (Signed)
Physical Therapy Evaluation Patient Details Name: Maria [name] MRN: [id number] DOB: 03-02-1956 Today's Date: 07/30/2023  History of Present Illness  Pt is 67 yo female presenting to [facility] with intractable pain s/p fall at home. MRI performed on 8/9 under sedation. PMH: Anxiety, depression, DM II, HLD, HTN, HFrEF, GERD, chrnoic arthritis and chronic pain on narcotics, LE weakness with radicular symptoms chronically.  Clinical Impression  Pt is presenting below baseline level of functioning. Prior to admission pt was Mod I for mobility using a RW and rollator. Currently pt is Min-Mod A for bed mobility, Mod A +2 (total assist) for sit to stand at EOB with heavy UE use on RW and difficulty with keeping bil feet planted on the floor for safe stand requiring multiple rocks to build momentum prior to standing with 2 person assist. Pt was unable able to progress ambulation. Due to current functional status, home set up and available assistance recommending skilled physical therapy services at 5x/weekly at a higher level of care and frequency in order to decrease risk for falls, injury, immobility, skin break down and re-hospitalization.         If plan is discharge home, recommend the following: A little help with walking and/or transfers;Assist for transportation;Assistance with cooking/housework;Help with stairs or ramp for entrance   Can travel by private vehicle   No    Equipment Recommendations Other (comment) (defer to post acute)  Recommendations for Other Services       Functional Status Assessment Patient has had a recent decline in their functional status and demonstrates the ability to make significant improvements in function in a reasonable and predictable amount of time.     Precautions / Restrictions Precautions Precautions: Fall Restrictions Weight Bearing Restrictions: No      Mobility  Bed Mobility Overal bed mobility: Needs Assistance Bed Mobility: Sit to Supine,  Supine to Sit     Supine to sit: Min assist, HOB elevated, Used rails Sit to supine: Mod assist   General bed mobility comments: some assist to scoot hips to EOB, use of bedrails. Mod A for BLE back to bed Patient Response: Cooperative  Transfers Overall transfer level: Needs assistance Equipment used: Rolling walker (2 wheels) Transfers: Sit to/from Stand Sit to Stand: Mod assist, +2 physical assistance, +2 safety/equipment           General transfer comment: Mod A x 2 for standing at bedside and side steps with RW. limited by pain in L knee. Pt has difficulty keeping feet planted as she goes to stand    Ambulation/Gait               General Gait Details: side steps at EOB with extra time for wgt shifting, Min A for balance and assistance navigating AD.    Tilt Bed Tilt Bed Patient Response: Cooperative  Modified Rankin (Stroke Patients Only)       Balance Overall balance assessment: Needs assistance Sitting-balance support: No upper extremity supported, Feet supported Sitting balance-Leahy Scale: Fair     Standing balance support: Bilateral upper extremity supported, During functional activity Standing balance-Leahy Scale: Poor Standing balance comment: Pt requires hands on Min A for balance in standing and side stepping with heavy UE support on AD       Pertinent Vitals/Pain Pain Assessment Pain Assessment: Faces Faces Pain Scale: Hurts even more Breathing: normal Negative Vocalization: none Facial Expression: smiling or inexpressive Body Language: relaxed Consolability: no need to console PAINAD Score: 0 Pain Location: back of  L knee Pain Descriptors / Indicators: Sore, Guarding, Grimacing Pain Intervention(s): Monitored during session, Limited activity within patient's tolerance, Premedicated before session    Home Living Family/patient expects to be discharged to:: Private residence Living Arrangements: Alone Available Help at Discharge:  Family;Friend(s);Available PRN/intermittently Type of Home: Apartment Home Access: Stairs to enter Entrance Stairs-Rails: Right Entrance Stairs-Number of Steps: 6   Home Layout: One level Home Equipment: Rollator (4 wheels);Cane - single point;BSC/3in1;Shower seat;Hand held shower head;Grab bars - tub/shower;Adaptive equipment;Rolling Walker (2 wheels)      Prior Function Prior Level of Function : Needs assist;History of Falls (last six months)             Mobility Comments: uses RW in house and rollator outside, reliant on support of rail for stairs. reports this fall occurred due to leaving RW in living room and LE giving out when working in the kitchen without AD ADLs Comments: Indep with adaptive equipment, uses shower chair, has groceries delivered, doesn't drive     Extremity/Trunk Assessment   Upper Extremity Assessment Upper Extremity Assessment: Defer to OT evaluation    Lower Extremity Assessment Lower Extremity Assessment: Generalized weakness;LLE deficits/detail LLE Deficits / Details: Pain at the L knee, no buckling noted    Cervical / Trunk Assessment Cervical / Trunk Assessment: Normal  Communication   Communication Communication: No apparent difficulties  Cognition Arousal: Alert Behavior During Therapy: WFL for tasks assessed/performed Overall Cognitive Status: Within Functional Limits for tasks assessed          General Comments General comments (skin integrity, edema, etc.): SpO2 RA 85-93%. BP WNL        Assessment/Plan    PT Assessment Patient needs continued PT services  PT Problem List Decreased strength;Decreased mobility;Decreased safety awareness;Decreased activity tolerance;Decreased balance;Pain       PT Treatment Interventions DME instruction;Therapeutic exercise;Gait training;Manual techniques;Balance training;Stair training;Neuromuscular re-education;Functional mobility training;Therapeutic activities;Patient/family education     PT Goals (Current goals can be found in the Care Plan section)  Acute Rehab PT Goals Patient Stated Goal: To go home PT Goal Formulation: With patient Time For Goal Achievement: 08/13/23 Potential to Achieve Goals: Fair    Frequency Min 1X/week     Co-evaluation PT/OT/SLP Co-Evaluation/Treatment: Yes Reason for Co-Treatment: For patient/therapist safety;Other (comment) (due to significant pain with mobility currently) PT goals addressed during session: Mobility/safety with mobility;Proper use of DME         AM-PAC PT "6 Clicks" Mobility  Outcome Measure Help needed turning from your back to your side while in a flat bed without using bedrails?: A Little Help needed moving from lying on your back to sitting on the side of a flat bed without using bedrails?: A Lot Help needed moving to and from a bed to a chair (including a wheelchair)?: Total Help needed standing up from a chair using your arms (e.g., wheelchair or bedside chair)?: Total Help needed to walk in hospital room?: Total Help needed climbing 3-5 steps with a railing? : Total 6 Click Score: 9    End of Session Equipment Utilized During Treatment: Gait belt Activity Tolerance: Patient limited by pain Patient left: in bed;with call bell/phone within reach;with bed alarm set Nurse Communication: Mobility status PT Visit Diagnosis: Other abnormalities of gait and mobility (R26.89);Unsteadiness on feet (R26.81);Muscle weakness (generalized) (M62.81);History of falling (Z91.81)    Time: 1423-1450 PT Time Calculation (min) (ACUTE ONLY): 27 min   Charges:   PT Evaluation $PT Eval Low Complexity: 1 Low   PT General Charges $$  ACUTE PT VISIT: 1 Visit         Harrel Carina, DPT, CLT  Acute Rehabilitation Services Office: [phone] (Secure chat preferred)   Claudia Desanctis 07/30/2023, 3:27 PM

## 2023-07-30 NOTE — Assessment & Plan Note (Addendum)
Patient has prior history and has compression socks on currently in the room and lymphedema. Continue to diuresis and give home medications  - Spironolactone 12.5 mg daily - Torsemide 40 mg daily - Metoprolol succinate 25 mg daily

## 2023-07-30 NOTE — Progress Notes (Signed)
Patient remains off the floor.

## 2023-07-30 NOTE — Progress Notes (Signed)
Daily Progress Note Intern Pager: [phone]  Patient name: Maria [name] Medical record number: [id number] Date of birth: Jun 20, 1956 Age: 67 y.o. Gender: female  Primary Care Provider: Ignatius Specking, NP Consultants: None Code Status: Full Code  Pt Overview and Major Events to Date:  8/8 Admission  8/9 MRI under anesthesia shows spinal stenosis and degenerative changes, seen on prior imaging   Assessment and Plan: Maria [name] is a 67 y.o. female with a PMHX of HTN, depression/anxiety, type2DM, HLD, HFrEF  67 year old female with past medical history of anxiety and depression, T2DM, HLD, HTN, HFrEF, GERD, chronic arthritis with chronic pain and on narcotics chronically presented with increased lower extremity weakness with radicular symptoms.  On presentation, x-ray of her pelvis, L-spine, T-spine, bilateral knees and ankles were unrevealing.  Patient could not get regular MRI because of photophobia.  She was admitted for intractable pain with need for MRI under general anesthesia.   [facility]     * (Principal) Fall     Patient experienced a mechanical fall at home, lumbar spine and ankle  x-ray negative, knee s-ray mild to moderative degenerative disease  bilaterally. MRI showed stable stable foraminal stenosis and some  progressive degeneration seen on prior imaging - MRI Thoracic:  small disc protrusions T5-6 and T8-9 and mild bilateral  neural foraminal narrowing at T9-10 and T10-11. - MRI Lumbar: progressive degeneration, bulky disc bulging and facet  arthropathy at both levels, some moderate foraminal stenosis at L2-L3 - Avoid sedating medications - PT/OT ordered for evaluation         Chronic low back pain     Patient has a history of chronic back pain, Home regimen is oxycodone  10 mg q4h, gabapentin 300 mg at bedtime  - s/p 0.5 mg diuladid x1  - oxycodone 10 mg q4h prn  - Increased night to Gabapentin 400 mg  at bedtime         (HFpEF) heart failure  with preserved ejection fraction (HCC)     Patient has prior history and has compression socks on currently in the  room and lymphedema. Continue to diuresis and give home medications  - Spironolactone 12.5 mg daily - Torsemide 40 mg daily - Metoprolol succinate 25 mg daily        Chronic Medical problems:  Anxiety/Depression: celexa 20 mg   FEN/GI: Carb modified  PPx: Lovenox  Dispo:Home pending clinical improvement . Barriers include pain control and deconditioning .   Subjective:  The patient still has some chronic lower back and knee pain after falling the other day. Has oxycodoone at home for chronic pain and takes gabapentin 300 mg at bedtime. Willing to make adjustments to gaba 300 BID, if necessary. Denies fever, chills, chest pain. Tolerating food w/o nausea or vomiting   Objective: Temp:  [98 F (36.7 C)-99 F (37.2 C)] 99 F (37.2 C) (08/09 1200) Pulse Rate:  [78-81] 78 (08/09 1350) Resp:  [12-20] 20 (08/09 1350) BP: (100-120)/(38-65) 104/50 (08/09 1350) SpO2:  [92 %-96 %] 92 % (08/09 1500) Weight:  [319 lb (144.7 kg)] 319 lb (144.7 kg) (08/09 0826)  Physical Exam Constitutional:      General: She is not in acute distress.    Appearance: She is obese. She is not ill-appearing.  Eyes:     Conjunctiva/sclera: Conjunctivae normal.  Cardiovascular:     Rate and Rhythm: Normal rate.  Pulmonary:     Effort: Pulmonary effort is  normal.     Breath sounds: No wheezing.  Abdominal:     Palpations: Abdomen is soft.     Tenderness: There is no abdominal tenderness.  Musculoskeletal:     Right lower leg: 2+ Pitting Edema present.     Left lower leg: 2+ Pitting Edema present.     Right ankle: No swelling.     Left ankle: No swelling.  Neurological:     Mental Status: She is alert and oriented to person, place, and time.      Laboratory: Most recent CBC Lab Results  Component Value Date   WBC 8.0 07/28/2023   HGB 10.9 (L) 07/28/2023   HCT 34.7 (L) 07/28/2023    MCV 91.8 07/28/2023   PLT 257 07/28/2023   Most recent BMP    Latest Ref Rng & Units 07/28/2023   10:32 PM  BMP  Glucose 70 - 99 mg/dL 79   BUN 8 - 23 mg/dL 9   Creatinine 4.40 - 3.47 mg/dL 4.25   Sodium 956 - 387 mmol/L 135   Potassium 3.5 - 5.1 mmol/L 4.6   Chloride 98 - 111 mmol/L 103   CO2 22 - 32 mmol/L 24   Calcium 8.9 - 10.3 mg/dL 8.8     Other pertinent labs None    Imaging/Diagnostic Tests: MRI: Thoracic   IMPRESSION: 1. Mild degenerative changes of the thoracic spine with small disc protrusions at T5-6 and T8-9 and mild bilateral neural foraminal narrowing at T9-10 and T10-11. 2. No high-grade spinal canal or neural foraminal stenosis at any level.  MRI: Lumbar  1. Mildly transitional lumbosacral anatomy with partially sacralized L5 level. Correlation with radiographs is recommended prior to any operative intervention. 2. Progressed lumbar spine degeneration since 2015 at both L3-L4 and L4-L5. Bulky disc bulging and facet arthropathy at both levels, with subtle anterolisthesis at the former. Increased moderate to severe spinal and lateral recess stenosis at both levels, while moderate biforaminal stenosis at both levels has not significantly changed. Query L4 and/or L5 radiculitis. 3. Stable mild spinal and moderate neural foraminal stenosis at L2-L3.  R and L ankle x-ray: Negative.   Pelvis x-ray: Negative   R and L knee: degenerative changes   Peterson Ao, MD 07/30/2023, 4:29 PM  PGY-1, [facility] Health Family Medicine FPTS Intern pager: [phone], text pages welcome Secure chat group [facility] Teaching Service

## 2023-07-30 NOTE — Assessment & Plan Note (Signed)
Patient experienced a mechanical fall at home, lumbar spine and ankle x-ray negative, knee s-ray mild to moderative degenerative disease bilaterally. MRI showed stable stable foraminal stenosis and some progressive degeneration seen on prior imaging - MRI Thoracic:  small disc protrusions T5-6 and T8-9 and mild bilateral neural foraminal narrowing at T9-10 and T10-11. - MRI Lumbar: progressive degeneration, bulky disc bulging and facet arthropathy at both levels, some moderate foraminal stenosis at L2-L3 - Avoid sedating medications - PT/OT ordered for evaluation

## 2023-07-30 NOTE — Progress Notes (Signed)
Brief note Chart reviewed-just discharged from family practice teaching service almost 10 days back, spoke with family practice resident earlier this morning-they will resume primary service. Hospitalist service will sign off.

## 2023-07-30 NOTE — Anesthesia Procedure Notes (Signed)
Procedure Name: Intubation Date/Time: 07/30/2023 9:13 AM  Performed by: Gwenyth Allegra, CRNAPre-anesthesia Checklist: Patient identified Patient Re-evaluated:Patient Re-evaluated prior to induction Oxygen Delivery Method: Circle system utilized Preoxygenation: Pre-oxygenation with 100% oxygen Induction Type: IV induction, Rapid sequence and Cricoid Pressure applied Laryngoscope Size: Mac and 4 Grade View: Grade II Tube type: Oral Tube size: 7.0 mm Number of attempts: 1 Placement Confirmation: ETT inserted through vocal cords under direct vision, positive ETCO2 and breath sounds checked- equal and bilateral Tube secured with: Tape Dental Injury: Teeth and Oropharynx as per pre-operative assessment

## 2023-07-30 NOTE — Transfer of Care (Signed)
Immediate Anesthesia Transfer of Care Note  Patient: Maria [name]  Procedure(s) Performed: MRI WITH ANESTHESIA - THORACIC LUMBAR WITHOUT CONSTRAST  Patient Location: PACU  Anesthesia Type:General  Level of Consciousness: awake, alert , and oriented  Airway & Oxygen Therapy: Patient Spontanous Breathing and Patient connected to nasal cannula oxygen  Post-op Assessment: Report given to RN and Post -op Vital signs reviewed and stable  Post vital signs: Reviewed and stable  Last Vitals:  Vitals Value Taken Time  BP 101/45   Temp    Pulse 78   Resp 14   SpO2 96%     Last Pain:  Vitals:   07/30/23 0826  TempSrc: Oral  PainSc: 4       Patients Stated Pain Goal: 0 (07/28/23 2036)  Complications: No notable events documented.

## 2023-07-30 NOTE — Assessment & Plan Note (Signed)
Home regimen:  - Spironolactone 12.5 mg daily - Torsemide 40 mg daily - Metoprolol succinate 25 mg daily

## 2023-07-30 NOTE — Progress Notes (Signed)
OT Cancellation Note  Patient Details Name: Maria [name] MRN: [id number] DOB: [birth year]   Cancelled Treatment:    Reason Eval/Treat Not Completed: Other (comment) Noted imminent OT orders. Per nursing, pt just back to floor s/p MRI with intubation/sedation, blood glucose low and awaiting lunch. RN recommends checking back and pt may be ready for mobility at that time. Will follow up as schedule permits.  Lorre Munroe 07/30/2023, 12:37 PM

## 2023-07-30 NOTE — Assessment & Plan Note (Signed)
Patient has a history of chronic back pain, Home regimen is oxycodone 10 mg q4h, gabapentin 300 mg at bedtime  - s/p 0.5 mg diuladid x1  - oxycodone 10 mg q4h prn  - Increased night to Gabapentin 400 mg  at bedtime

## 2023-07-30 NOTE — Progress Notes (Signed)
Patient to MRI patient will be sedated for MRI.

## 2023-07-31 DIAGNOSIS — W19XXXD Unspecified fall, subsequent encounter: Secondary | ICD-10-CM

## 2023-07-31 DIAGNOSIS — F1120 Opioid dependence, uncomplicated: Secondary | ICD-10-CM | POA: Diagnosis present

## 2023-07-31 DIAGNOSIS — M25561 Pain in right knee: Secondary | ICD-10-CM | POA: Diagnosis present

## 2023-07-31 DIAGNOSIS — N1831 Chronic kidney disease, stage 3a: Secondary | ICD-10-CM | POA: Diagnosis present

## 2023-07-31 DIAGNOSIS — M25571 Pain in right ankle and joints of right foot: Secondary | ICD-10-CM | POA: Diagnosis present

## 2023-07-31 DIAGNOSIS — M5124 Other intervertebral disc displacement, thoracic region: Secondary | ICD-10-CM | POA: Diagnosis present

## 2023-07-31 DIAGNOSIS — G894 Chronic pain syndrome: Secondary | ICD-10-CM | POA: Diagnosis present

## 2023-07-31 DIAGNOSIS — F32A Depression, unspecified: Secondary | ICD-10-CM | POA: Diagnosis present

## 2023-07-31 DIAGNOSIS — F411 Generalized anxiety disorder: Secondary | ICD-10-CM | POA: Diagnosis present

## 2023-07-31 DIAGNOSIS — E1122 Type 2 diabetes mellitus with diabetic chronic kidney disease: Secondary | ICD-10-CM | POA: Diagnosis present

## 2023-07-31 DIAGNOSIS — M545 Low back pain, unspecified: Secondary | ICD-10-CM | POA: Diagnosis not present

## 2023-07-31 DIAGNOSIS — E876 Hypokalemia: Secondary | ICD-10-CM | POA: Diagnosis present

## 2023-07-31 DIAGNOSIS — R29898 Other symptoms and signs involving the musculoskeletal system: Secondary | ICD-10-CM | POA: Diagnosis not present

## 2023-07-31 DIAGNOSIS — G8929 Other chronic pain: Secondary | ICD-10-CM | POA: Diagnosis not present

## 2023-07-31 DIAGNOSIS — M4804 Spinal stenosis, thoracic region: Secondary | ICD-10-CM | POA: Diagnosis present

## 2023-07-31 DIAGNOSIS — I5082 Biventricular heart failure: Secondary | ICD-10-CM | POA: Diagnosis present

## 2023-07-31 DIAGNOSIS — I890 Lymphedema, not elsewhere classified: Secondary | ICD-10-CM | POA: Diagnosis present

## 2023-07-31 DIAGNOSIS — W010XXA Fall on same level from slipping, tripping and stumbling without subsequent striking against object, initial encounter: Secondary | ICD-10-CM | POA: Diagnosis present

## 2023-07-31 DIAGNOSIS — E1151 Type 2 diabetes mellitus with diabetic peripheral angiopathy without gangrene: Secondary | ICD-10-CM | POA: Diagnosis present

## 2023-07-31 DIAGNOSIS — E785 Hyperlipidemia, unspecified: Secondary | ICD-10-CM | POA: Diagnosis present

## 2023-07-31 DIAGNOSIS — M5440 Lumbago with sciatica, unspecified side: Secondary | ICD-10-CM | POA: Diagnosis not present

## 2023-07-31 DIAGNOSIS — I5022 Chronic systolic (congestive) heart failure: Secondary | ICD-10-CM | POA: Diagnosis present

## 2023-07-31 DIAGNOSIS — Y92009 Unspecified place in unspecified non-institutional (private) residence as the place of occurrence of the external cause: Secondary | ICD-10-CM | POA: Diagnosis not present

## 2023-07-31 DIAGNOSIS — M25562 Pain in left knee: Secondary | ICD-10-CM | POA: Diagnosis present

## 2023-07-31 DIAGNOSIS — K219 Gastro-esophageal reflux disease without esophagitis: Secondary | ICD-10-CM | POA: Diagnosis present

## 2023-07-31 DIAGNOSIS — D638 Anemia in other chronic diseases classified elsewhere: Secondary | ICD-10-CM | POA: Diagnosis present

## 2023-07-31 DIAGNOSIS — I130 Hypertensive heart and chronic kidney disease with heart failure and stage 1 through stage 4 chronic kidney disease, or unspecified chronic kidney disease: Secondary | ICD-10-CM | POA: Diagnosis present

## 2023-07-31 DIAGNOSIS — R300 Dysuria: Secondary | ICD-10-CM | POA: Diagnosis present

## 2023-07-31 DIAGNOSIS — G2581 Restless legs syndrome: Secondary | ICD-10-CM | POA: Diagnosis present

## 2023-07-31 LAB — BASIC METABOLIC PANEL
Anion gap: 7 (ref 5–15)
BUN: 11 mg/dL (ref 8–23)
CO2: 33 mmol/L — ABNORMAL HIGH (ref 22–32)
Calcium: 8.1 mg/dL — ABNORMAL LOW (ref 8.9–10.3)
Chloride: 99 mmol/L (ref 98–111)
Creatinine, Ser: 1.19 mg/dL — ABNORMAL HIGH (ref 0.44–1.00)
GFR, Estimated: 50 mL/min — ABNORMAL LOW (ref >60)
Glucose, Bld: 111 mg/dL — ABNORMAL HIGH (ref 70–99)
Potassium: 3.9 mmol/L (ref 3.5–5.1)
Sodium: 139 mmol/L (ref 135–145)

## 2023-07-31 LAB — GLUCOSE, CAPILLARY
Glucose-Capillary: 95 mg/dL (ref 70–99)
Glucose-Capillary: 122 mg/dL — ABNORMAL HIGH (ref 70–99)
Glucose-Capillary: 126 mg/dL — ABNORMAL HIGH (ref 70–99)
Glucose-Capillary: 129 mg/dL — ABNORMAL HIGH (ref 70–99)

## 2023-07-31 MED ORDER — POTASSIUM CHLORIDE 20 MEQ PO PACK
40.0000 meq | PACK | Freq: Once | ORAL | Status: AC
  Administered 2023-07-31: 40 meq via ORAL
  Filled 2023-07-31: qty 2

## 2023-07-31 MED ORDER — BACLOFEN 10 MG PO TABS
5.0000 mg | ORAL_TABLET | Freq: Once | ORAL | Status: AC
  Administered 2023-07-31: 5 mg via ORAL
  Filled 2023-07-31: qty 1

## 2023-07-31 MED ORDER — POTASSIUM CHLORIDE 10 MEQ/100ML IV SOLN
10.0000 meq | INTRAVENOUS | Status: DC
  Filled 2023-07-31 (×2): qty 100

## 2023-07-31 MED ORDER — GABAPENTIN 400 MG PO CAPS
400.0000 mg | ORAL_CAPSULE | Freq: Every day | ORAL | Status: DC
  Administered 2023-07-31 – 2023-08-02 (×3): 400 mg via ORAL
  Filled 2023-07-31 (×3): qty 1

## 2023-07-31 NOTE — Assessment & Plan Note (Signed)
Potassium 3.0 on AM BMP - oral Kcl - IV Kcl x4 hours -recheck PM BMP

## 2023-07-31 NOTE — Plan of Care (Signed)
  Problem: Nutritional: Goal: Maintenance of adequate nutrition will improve Outcome: Progressing   Problem: Safety: Goal: Ability to remain free from injury will improve Outcome: Progressing   Problem: Skin Integrity: Goal: Risk for impaired skin integrity will decrease Outcome: Not Progressing

## 2023-07-31 NOTE — Plan of Care (Signed)
  Problem: Health Behavior/Discharge Planning: Goal: Ability to manage health-related needs will improve Outcome: Progressing   Problem: Nutritional: Goal: Maintenance of adequate nutrition will improve Outcome: Progressing   Problem: Skin Integrity: Goal: Risk for impaired skin integrity will decrease Outcome: Progressing   

## 2023-07-31 NOTE — Assessment & Plan Note (Addendum)
Patient has a history of chronic back pain, Home regimen is oxycodone 10 mg q4h, gabapentin 300 mg at bedtime  - s/p 0.5 mg diuladid x2 (8/8 and 8/9) - oxycodone 10 mg q4h prn (given 2x on 8/9) - Increased night to Gabapentin 400 mg  at bedtime  - PRN tylenol 650mg 

## 2023-07-31 NOTE — Progress Notes (Signed)
Daily Progress Note Intern Pager: [phone]  Patient name: Maria [name] Medical record number: [id number] Date of birth: 08-28-1956 Age: 68 y.o. Gender: female  Primary Care Provider: Ignatius Specking, NP Consultants: none Code Status: full code  Pt Overview and Major Events to Date:  8/8 Admission  8/9 MRI under anesthesia shows spinal stenosis and degenerative changes, seen on prior imaging   Assessment and Plan: Maria [name] is a 67 y.o. female with a PMHX of HTN, depression/anxiety, type2DM, HLD, HFrEF  67 year old female with past medical history of anxiety and depression, T2DM, HLD, HTN, HFrEF, GERD, chronic arthritis with chronic pain and on narcotics chronically presented with increased lower extremity weakness with radicular symptoms.  On presentation, x-ray of her pelvis, L-spine, T-spine, bilateral knees and ankles were unrevealing.  Patient could not get regular MRI because of photophobia.  She was admitted for intractable pain with need for MRI under general anesthesia.  Receiving PRN oxycodone 10mg  and dilaudid 0.5mg .  [facility]     * (Principal) Fall     Patient experienced a mechanical fall at home, lumbar spine and ankle  x-ray negative, knee s-ray mild to moderative degenerative disease  bilaterally. MRI showed stable stable foraminal stenosis and some  progressive degeneration seen on prior imaging - MRI Thoracic:  small disc protrusions T5-6 and T8-9 and mild bilateral  neural foraminal narrowing at T9-10 and T10-11. - MRI Lumbar: progressive degeneration, bulky disc bulging and facet  arthropathy at both levels, some moderate foraminal stenosis at L2-L3 - Avoid sedating medications - PT recommends services 5x/week at higher level of care - OT recommends <3 hrs/day if pain and current assist levels persist        Chronic low back pain     Patient has a history of chronic back pain, Home regimen is oxycodone  10 mg q4h, gabapentin 300 mg at  bedtime  - s/p 0.5 mg diuladid x2 (8/8 and 8/9) - oxycodone 10 mg q4h prn (given 2x on 8/9) - Increased night to Gabapentin 400 mg  at bedtime  - PRN tylenol 650mg          Hypokalemia     Potassium 3.0 on AM BMP - oral Kcl - IV Kcl x4 hours -recheck PM BMP        (HFpEF) heart failure with preserved ejection fraction (HCC)     Patient has prior history and has compression socks on currently in the  room and lymphedema. Continue to diuresis and give home medications  - Spironolactone 12.5 mg daily - Torsemide 40 mg daily - Metoprolol succinate 25 mg daily - 2.6 L UOP 8/9          Weakness of both lower extremities     Morbid obesity (HCC)    FEN/GI: carb modified PPx: lovenox Dispo: pending clinical improvement  Subjective:  Patient resting comfortably in bed this morning.  Complaining of ongoing back pain, states she just took an oxycodone.  States that she prefers to not go to the [facility] inpatient rehab as she had bad experience there in the past.  States she has a sore throat from being intubated for her MRI under sedation yesterday.  States her last bowel movement was yesterday.  No other complaints at this time.  Objective: Temp:  [98.2 F (36.8 C)-99 F (37.2 C)] 98.3 F (36.8 C) (08/09 2341) Pulse Rate:  [74-81] 76 (08/09 2341) Resp:  [12-20] 15 (08/09 2341) BP: (  100-125)/(38-54) 125/54 (08/09 2341) SpO2:  [91 %-96 %] 96 % (08/09 2341) Weight:  [144.7 kg] 144.7 kg (08/09 0826) Physical Exam Constitutional:      Appearance: Normal appearance. She is obese.  Cardiovascular:     Rate and Rhythm: Normal rate and regular rhythm.     Heart sounds: Normal heart sounds.  Pulmonary:     Effort: Pulmonary effort is normal.     Breath sounds: Normal breath sounds.  Abdominal:     General: There is no distension.     Palpations: Abdomen is soft.     Tenderness: There is no abdominal tenderness.  Musculoskeletal:     Right lower leg: Tenderness present. 1+  Edema present.     Left lower leg: Tenderness present. 1+ Edema present.  Skin:    General: Skin is warm.     Findings: Erythema present.     Comments: Erythema, scaling, warmth, and mild tenderness from mid-shin to ankles bilaterally, consistent with exam findings on admission and photo from 04/30/2022  Neurological:     Mental Status: She is alert.     Laboratory: Most recent CBC Lab Results  Component Value Date   WBC 8.0 07/28/2023   HGB 10.9 (L) 07/28/2023   HCT 34.7 (L) 07/28/2023   MCV 91.8 07/28/2023   PLT 257 07/28/2023   Most recent BMP    Latest Ref Rng & Units 07/31/2023    2:37 AM  BMP  Glucose 70 - 99 mg/dL 87   BUN 8 - 23 mg/dL 11   Creatinine 4.25 - 1.00 mg/dL 9.56   Sodium 387 - 564 mmol/L 138   Potassium 3.5 - 5.1 mmol/L 3.0   Chloride 98 - 111 mmol/L 98   CO2 22 - 32 mmol/L 33   Calcium 8.9 - 10.3 mg/dL 8.0     Other pertinent labs: none  Imaging/Diagnostic Tests: none 07/31/2023, 6:33 AM  PGY-1, [facility] FPTS Intern pager: [phone], text pages welcome Secure chat group [facility] Teaching Service

## 2023-07-31 NOTE — Assessment & Plan Note (Signed)
Patient has prior history and has compression socks on currently in the room and lymphedema. Continue to diuresis and give home medications  - Spironolactone 12.5 mg daily - Torsemide 40 mg daily - Metoprolol succinate 25 mg daily - 2.6 L UOP 8/9

## 2023-07-31 NOTE — Assessment & Plan Note (Addendum)
Patient experienced a mechanical fall at home, lumbar spine and ankle x-ray negative, knee s-ray mild to moderative degenerative disease bilaterally. MRI showed stable stable foraminal stenosis and some progressive degeneration seen on prior imaging - MRI Thoracic:  small disc protrusions T5-6 and T8-9 and mild bilateral neural foraminal narrowing at T9-10 and T10-11. - MRI Lumbar: progressive degeneration, bulky disc bulging and facet arthropathy at both levels, some moderate foraminal stenosis at L2-L3 - Avoid sedating medications - PT recommends services 5x/week at higher level of care - OT recommends <3 hrs/day if pain and current assist levels persist

## 2023-08-01 ENCOUNTER — Inpatient Hospital Stay (HOSPITAL_COMMUNITY): Payer: 59

## 2023-08-01 DIAGNOSIS — M25571 Pain in right ankle and joints of right foot: Secondary | ICD-10-CM | POA: Insufficient documentation

## 2023-08-01 DIAGNOSIS — R29898 Other symptoms and signs involving the musculoskeletal system: Secondary | ICD-10-CM | POA: Diagnosis not present

## 2023-08-01 DIAGNOSIS — G8929 Other chronic pain: Secondary | ICD-10-CM | POA: Diagnosis not present

## 2023-08-01 DIAGNOSIS — M545 Low back pain, unspecified: Secondary | ICD-10-CM | POA: Diagnosis not present

## 2023-08-01 LAB — BASIC METABOLIC PANEL
Anion gap: 11 (ref 5–15)
BUN: 13 mg/dL (ref 8–23)
CO2: 32 mmol/L (ref 22–32)
Calcium: 8.2 mg/dL — ABNORMAL LOW (ref 8.9–10.3)
Chloride: 95 mmol/L — ABNORMAL LOW (ref 98–111)
Creatinine, Ser: 1.35 mg/dL — ABNORMAL HIGH (ref 0.44–1.00)
GFR, Estimated: 43 mL/min — ABNORMAL LOW (ref >60)
Glucose, Bld: 144 mg/dL — ABNORMAL HIGH (ref 70–99)
Potassium: 3.4 mmol/L — ABNORMAL LOW (ref 3.5–5.1)
Sodium: 138 mmol/L (ref 135–145)

## 2023-08-01 LAB — GLUCOSE, CAPILLARY
Glucose-Capillary: 124 mg/dL — ABNORMAL HIGH (ref 70–99)
Glucose-Capillary: 136 mg/dL — ABNORMAL HIGH (ref 70–99)
Glucose-Capillary: 103 mg/dL — ABNORMAL HIGH (ref 70–99)
Glucose-Capillary: 123 mg/dL — ABNORMAL HIGH (ref 70–99)

## 2023-08-01 MED ORDER — POTASSIUM CHLORIDE CRYS ER 20 MEQ PO TBCR
40.0000 meq | EXTENDED_RELEASE_TABLET | ORAL | Status: AC
  Administered 2023-08-01 (×2): 40 meq via ORAL
  Filled 2023-08-01 (×2): qty 2

## 2023-08-01 NOTE — Assessment & Plan Note (Signed)
New onset R ankle pain noted overnight.  No swelling, pain worst at calcaneus.  Preliminary Xray 2 view not showing fracture. - Baclofen 5 mg x1 overnight, consider again if needed - Aquathermia - Ankle brace assistive device

## 2023-08-01 NOTE — Assessment & Plan Note (Signed)
Mechanical fall at home prior to admission. - MRI with thoracic and lumbar disc bulging and degenerative changes - Avoid sedating medications - PT & OT, recommending SNF or CIR

## 2023-08-01 NOTE — Assessment & Plan Note (Signed)
Potassium 3.6, repleted with 40 mEq KCl PO - Daily AM BMP, replete as necessary

## 2023-08-01 NOTE — Assessment & Plan Note (Signed)
Patient has a history of chronic back pain.  Home regimen is oxycodone 10 mg q4h, gabapentin 300 mg at bedtime.  45/120 MME used 8/10. - Oxycodone 10 mg Q4h PRN for total 90 MME - Gabapentin 400 mg at bedtime - PRN tylenol 650 mg

## 2023-08-01 NOTE — Plan of Care (Signed)
  Problem: Health Behavior/Discharge Planning: Goal: Ability to manage health-related needs will improve Outcome: Progressing   Problem: Metabolic: Goal: Ability to maintain appropriate glucose levels will improve Outcome: Progressing   Problem: Nutritional: Goal: Maintenance of adequate nutrition will improve Outcome: Progressing   Problem: Skin Integrity: Goal: Risk for impaired skin integrity will decrease Outcome: Progressing   

## 2023-08-01 NOTE — Progress Notes (Signed)
Daily Progress Note Intern Pager: [phone]  Patient name: Maria [name] Medical record number: [id number] Date of birth: 1956-03-21 Age: 67 y.o. Gender: female  Primary Care Provider: Ignatius Specking, NP Consultants: None Code Status: FULL  Pt Overview and Major Events to Date:  8/8 - Admission 8/9 - MRI under anesthesia shows spinal stenosis and degenerative changes, seen on prior imaging 8/11 - New R ankle pain, Xray preliminary read not showing fracture   Assessment and Plan: Maria [name] is a 67 y.o. female with a pertinent PMH of HTN, T2DM, HFrEF, and chronic pain on opiates who presented with increasing LE weakness and radicular symptoms following a fall and was admitted for MRI under general anesthesia in the setting of intractable pain and photophobia preventing normal MRI, currently receiving pain medication and awaiting placement at SNF.  [facility]     * (Principal) Fall     Mechanical fall at home prior to admission. - MRI with thoracic and lumbar disc bulging and degenerative changes - Avoid sedating medications - PT & OT, recommending SNF or CIR        Chronic low back pain     Patient has a history of chronic back pain.  Home regimen is oxycodone  10 mg q4h, gabapentin 300 mg at bedtime.  45/120 MME used 8/10. - Oxycodone 10 mg Q4h PRN for total 90 MME - Gabapentin 400 mg at bedtime - PRN tylenol 650 mg         Hypokalemia     Potassium 3.4, repleted with 40 mEq KCl PO - Daily AM BMP, replete as necessary        (HFpEF) heart failure with preserved ejection fraction (HCC)     Chronic lymphedema, continuing diuresis.  1.8 L UOP 8/10.  Last Wt  144.7 kg 8/9. - Spironolactone 12.5 mg daily - Torsemide 40 mg daily - Metoprolol succinate 25 mg daily - Strict I/Os        Ankle pain, right     New onset R ankle pain noted overnight.  No swelling, pain worst at  calcaneus.  Preliminary Xray 2 view not showing fracture. - Baclofen 5 mg x1  overnight, consider again if needed - Aquathermia - Ankle brace assistive device     FEN/GI: Carb modified PPx: Lovenox Dispo:  SNF  pending placement.  Subjective:  This morning, patient continues to complain of new R ankle pain.  Denies swelling, mechanical injury.  Reports back pain is continuing but somewhat improved from admission.  Reports she was able to sleep last night following her baclofen dose.  Objective: Temp:  [97.9 F (36.6 C)-98.6 F (37 C)] 97.9 F (36.6 C) (08/11 1440) Pulse Rate:  [75-80] 75 (08/11 1440) Resp:  [16-18] 16 (08/11 1440) BP: (104-127)/(45-99) 104/88 (08/11 1440) SpO2:  [93 %-99 %] 99 % (08/11 1440)  Physical Exam: General: Resting in bed, NAD, large body habitus, alert and at baseline. Cardiovascular: Regular rate and rhythm. Normal S1/S2. No murmurs, rubs, or gallops appreciated. 2+ radial pulses. Pulmonary: Clear bilaterally to ascultation. No increased WOB, no accessory muscle usage on room air. No wheezes, rales, or crackles. Extremities: 2+ pitting edema bilaterally to mid-shin, chronic skin changes overlying ankles. MSK: R ankle nonedematous beyond chronic lymphedema.  Tender to palpation globally and passive ROM limited by pain.  Pain with palpation worst posteriorly over calcaneus, but present across ankle nonspecifically.  Laboratory: Most recent CBC Lab Results  Component Value Date  WBC 8.0 07/28/2023   HGB 10.9 (L) 07/28/2023   HCT 34.7 (L) 07/28/2023   MCV 91.8 07/28/2023   PLT 257 07/28/2023   Most recent BMP    Latest Ref Rng & Units 08/01/2023   12:33 PM  BMP  Glucose 70 - 99 mg/dL 478   BUN 8 - 23 mg/dL 13   Creatinine 2.95 - 1.00 mg/dL 6.21   Sodium 308 - 657 mmol/L 138   Potassium 3.5 - 5.1 mmol/L 3.4   Chloride 98 - 111 mmol/L 95   CO2 22 - 32 mmol/L 32   Calcium 8.9 - 10.3 mg/dL 8.2    Other pertinent labs: - None  New Imaging/Diagnostic Tests: - R Ankle DG 2 view: Pending   ,  MD 08/01/2023, 4:22 PM [facility]  FMTS Intern pager: [phone], text pages welcome Secure chat group [facility] Teaching Service

## 2023-08-01 NOTE — Plan of Care (Signed)
  Problem: Education: Goal: Knowledge of General Education information will improve Description: Including pain rating scale, medication(s)/side effects and non-pharmacologic comfort measures Outcome: Progressing   Problem: Activity: Goal: Risk for activity intolerance will decrease Outcome: Not Progressing   

## 2023-08-01 NOTE — Assessment & Plan Note (Addendum)
Chronic lymphedema, continuing diuresis.  1.8 L UOP 8/10.  Last Wt 144.7 kg 8/9. - Spironolactone 12.5 mg daily - Torsemide 40 mg daily - Metoprolol succinate 25 mg daily - Strict I/Os

## 2023-08-01 NOTE — Progress Notes (Signed)
Orthopedic Tech Progress Note Patient Details:  Maria [name] 10/19/1956 [id number]  Ortho Devices Type of Ortho Device: ASO Ortho Device/Splint Location: RLE Ortho Device/Splint Interventions: Ordered, Application, Adjustment   Post Interventions Patient Tolerated: Fair Instructions Provided: Care of device, Adjustment of device  [place] A  08/01/2023, 3:22 PM

## 2023-08-02 ENCOUNTER — Encounter (HOSPITAL_COMMUNITY): Payer: Self-pay | Admitting: Radiology

## 2023-08-02 DIAGNOSIS — W19XXXD Unspecified fall, subsequent encounter: Secondary | ICD-10-CM | POA: Diagnosis not present

## 2023-08-02 DIAGNOSIS — M5440 Lumbago with sciatica, unspecified side: Secondary | ICD-10-CM | POA: Diagnosis not present

## 2023-08-02 DIAGNOSIS — R300 Dysuria: Secondary | ICD-10-CM

## 2023-08-02 DIAGNOSIS — M25571 Pain in right ankle and joints of right foot: Secondary | ICD-10-CM | POA: Diagnosis not present

## 2023-08-02 DIAGNOSIS — G8929 Other chronic pain: Secondary | ICD-10-CM | POA: Diagnosis not present

## 2023-08-02 LAB — CBC
HCT: 35.8 % — ABNORMAL LOW (ref 36.0–46.0)
Hemoglobin: 11.6 g/dL — ABNORMAL LOW (ref 12.0–15.0)
MCH: 29.4 pg (ref 26.0–34.0)
MCHC: 32.4 g/dL (ref 30.0–36.0)
MCV: 90.6 fL (ref 80.0–100.0)
Platelets: 219 10*3/uL (ref 150–400)
RBC: 3.95 MIL/uL (ref 3.87–5.11)
RDW: 14.2 % (ref 11.5–15.5)
WBC: 10.8 10*3/uL — ABNORMAL HIGH (ref 4.0–10.5)
nRBC: 0 % (ref 0.0–0.2)

## 2023-08-02 LAB — URINALYSIS, ROUTINE W REFLEX MICROSCOPIC
Bilirubin Urine: NEGATIVE
Glucose, UA: NEGATIVE mg/dL
Ketones, ur: NEGATIVE mg/dL
Nitrite: NEGATIVE
Protein, ur: NEGATIVE mg/dL
Specific Gravity, Urine: 1.012 (ref 1.005–1.030)
WBC, UA: >50 WBC/hpf (ref 0–5)
pH: 5 (ref 5.0–8.0)

## 2023-08-02 LAB — GLUCOSE, CAPILLARY
Glucose-Capillary: 121 mg/dL — ABNORMAL HIGH (ref 70–99)
Glucose-Capillary: 152 mg/dL — ABNORMAL HIGH (ref 70–99)
Glucose-Capillary: 127 mg/dL — ABNORMAL HIGH (ref 70–99)
Glucose-Capillary: 157 mg/dL — ABNORMAL HIGH (ref 70–99)

## 2023-08-02 MED ORDER — ENOXAPARIN SODIUM 60 MG/0.6ML IJ SOSY
60.0000 mg | PREFILLED_SYRINGE | INTRAMUSCULAR | Status: DC
  Administered 2023-08-03: 60 mg via SUBCUTANEOUS
  Filled 2023-08-02: qty 0.6

## 2023-08-02 MED ORDER — MAGNESIUM SULFATE 2 GM/50ML IV SOLN
2.0000 g | Freq: Once | INTRAVENOUS | Status: AC
  Administered 2023-08-02: 2 g via INTRAVENOUS
  Filled 2023-08-02: qty 50

## 2023-08-02 MED ORDER — POTASSIUM CHLORIDE CRYS ER 20 MEQ PO TBCR
40.0000 meq | EXTENDED_RELEASE_TABLET | Freq: Once | ORAL | Status: AC
  Administered 2023-08-02: 40 meq via ORAL
  Filled 2023-08-02: qty 2

## 2023-08-02 NOTE — NC FL2 (Signed)
[place] MEDICAID FL2 LEVEL OF CARE FORM     IDENTIFICATION  Patient Name: Maria [name] Birthdate: 03/09/1956 Sex: female Admission Date (Current Location): 07/28/2023  [facility] and [place] Number:  Producer, television/film/video and Address:  The [place], [address], [place], [place] [address]      Provider Number: [id number]  Attending Physician Name and Address:  Nestor Ramp, MD  Relative Name and Phone Number:  Rodriguez,[name] Daughter [phone]    Current Level of Care: Hospital Recommended Level of Care: Skilled Nursing Facility Prior Approval Number:    Date Approved/Denied:   PASRR Number: [id number]  Discharge Plan: SNF    Current Diagnoses: Patient Active Problem List   Diagnosis Date Noted   Hypomagnesemia 08/02/2023   Dysuria 08/02/2023   Ankle pain, right 08/01/2023   Weakness of both lower extremities 07/30/2023   Morbid obesity (HCC) 07/30/2023   General weakness 07/15/2023   (HFpEF) heart failure with preserved ejection fraction (HCC) 07/15/2023   Abdominal pain 07/15/2023   AKI (acute kidney injury) (HCC) 07/15/2023   Acute on chronic respiratory failure with hypoxia and hypercapnia (HCC) 02/08/2023   Pneumonia due to infectious organism 02/05/2023   Chronic right-sided congestive heart failure (HCC) 02/04/2023   Fall 02/02/2023   UTI (urinary tract infection) 07/22/2022   Weakness 07/22/2022   Fluid overload 07/21/2022   Acute cystitis with hematuria    Dyspnea 12/19/2021   Leg swelling 11/20/2021   Bilateral knee pain 10/29/2021   Hypernatremia 09/24/2021   Cellulitis of lower extremity 09/24/2021   CKD (chronic kidney disease), stage IIIa (HCC) 09/24/2021   Sacral decubitus ulcer, stage II (HCC) 09/20/2021   Acute on chronic heart failure with preserved ejection fraction (HFpEF) (HCC) 09/19/2021   Nail disorder 07/07/2021   Pre-ulcerative corn or callous 07/07/2021   Rash 07/07/2021   Acute kidney injury  superimposed on chronic kidney disease (HCC) 04/06/2021   Diastolic congestive heart failure (HCC) 03/17/2021   Diabetes mellitus type 2 in obese 03/11/2021   Vitamin D deficiency 01/01/2021   Medial epicondylitis 12/31/2020   Aortic atherosclerosis (HCC) 09/26/2020   Umbilical hernia 09/26/2020   Constipation 08/05/2020   Arthritis 06/12/2020   Hoarseness 05/01/2020   B12 deficiency 01/31/2020   Rosacea 01/31/2020   Neck swelling 01/31/2020   History of colonic polyps    Benign neoplasm of [place]    Nausea 08/31/2019   Throat pain 07/26/2019   HLD (hyperlipidemia) 05/24/2019   Leg cramps 05/24/2019   Lump in neck 05/24/2019   Left thyroid nodule 01/25/2019   Jerking movements of extremities 07/20/2018   Balance disorder 07/20/2018   Right knee pain 03/21/2018   OSA (obstructive sleep apnea) 03/21/2018   Oxygen desaturation 03/21/2018   Urinary symptom or sign 03/21/2018   Lymphedema 01/25/2018   Gait disorder 01/25/2018   Dysphonia 12/07/2017   Encounter for well adult exam with abnormal findings 11/08/2017   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   Huffstetler polyp 08/11/2016   Abdominal pain, epigastric    Dysphagia    Xu cancer screening    ACE-inhibitor cough 12/05/2015   Restless legs syndrome 10/08/2015   Venous stasis dermatitis of both lower extremities 04/26/2015   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/17/2014   AR (allergic rhinitis) 10/17/2014   Fatty liver 10/17/2014   Chronic pain syndrome 09/19/2014   Post-traumatic osteoarthritis of both knees 09/19/2014   Spinal stenosis 09/19/2014   Depression, major, single episode, complete remission (HCC) 09/19/2014   Narcotic  dependence (HCC) 09/19/2014   Acute hypoxic respiratory failure (HCC) 08/20/2014   False positive HIV serology 06/02/2013   Hypokalemia 06/29/2012   Spondylosis of 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 03/09/2012   Lumbar disc disease 03/09/2012   Chronic pain 03/09/2012    Morbid obesity due to excess calories (HCC) 06/17/2011   Chronic low back pain 12/28/2008   Anxiety state 12/06/2007   Depression 12/06/2007   HTN (hypertension) 12/06/2007   Gastroesophageal reflux disease without esophagitis 12/06/2007   LOW BACK PAIN 12/06/2007    Orientation RESPIRATION BLADDER Height & Weight     Self, Time, Situation  Normal Continent Weight: (!) 319 lb (144.7 kg) Height:  5\' 3"  (160 cm)  BEHAVIORAL SYMPTOMS/MOOD NEUROLOGICAL BOWEL NUTRITION STATUS      Continent Diet (see discharge summary)  AMBULATORY STATUS COMMUNICATION OF NEEDS Skin   Total Care Verbally Other (Comment) (redness)                       Personal Care Assistance Level of Assistance  Bathing, Feeding, Dressing Bathing Assistance: Limited assistance Feeding assistance: Independent Dressing Assistance: Limited assistance     Functional Limitations Info  Sight, Hearing, Speech Sight Info: Adequate Hearing Info: Adequate Speech Info: Adequate    SPECIAL CARE FACTORS FREQUENCY  PT (By licensed PT), OT (By licensed OT)     PT Frequency: 5x week OT Frequency: 5x week            Contractures Contractures Info: Not present    Additional Factors Info  Code Status, Allergies, Insulin Sliding Scale Code Status Info: full Allergies Info: Amoxicillin-pot Clavulanate, Adhesive (Tape), Codeine, Crestor (Rosuvastatin Calcium), Lyrica (Pregabalin), Morphine And Codeine, Vicodin (Hydrocodone-acetaminophen), Latex   Insulin Sliding Scale Info: Novolog: see discharge summary       Current Medications (08/02/2023):  This is the current hospital active medication list Current Facility-Administered Medications  Medication Dose Route Frequency Provider Last Rate Last Admin   acetaminophen (TYLENOL) tablet 650 mg  650 mg Oral Q6H PRN Jerre Simon, MD   650 mg at 08/02/23 1151   bisacodyl (DULCOLAX) suppository 10 mg  10 mg Rectal Daily PRN Glade Lloyd, MD       citalopram (CELEXA)  tablet 20 mg  20 mg Oral QHS Crosley, Debby, MD   20 mg at 08/01/23 2106   cyanocobalamin (VITAMIN B12) tablet 1,000 mcg  1,000 mcg Oral Daily Crosley, Debby, MD   1,000 mcg at 08/02/23 0821   [START ON 08/03/2023] enoxaparin (LOVENOX) injection 60 mg  60 mg Subcutaneous Q24H Hindel, Leah, MD       gabapentin (NEURONTIN) capsule 400 mg  400 mg Oral QHS Shelby Mattocks, DO   400 mg at 08/01/23 2105   insulin aspart (novoLOG) injection 0-15 Units  0-15 Units Subcutaneous TID WC Crosley, Debby, MD   3 Units at 08/02/23 1152   insulin aspart (novoLOG) injection 0-5 Units  0-5 Units Subcutaneous QHS Crosley, Debby, MD       metoprolol succinate (TOPROL-XL) 24 hr tablet 25 mg  25 mg Oral Daily Crosley, Debby, MD   25 mg at 08/02/23 0925   oxyCODONE (Oxy IR/ROXICODONE) immediate release tablet 10 mg  10 mg Oral Q4H PRN Crosley, Debby, MD   10 mg at 08/02/23 1151   pantoprazole (PROTONIX) EC tablet 40 mg  40 mg Oral Daily Crosley, Debby, MD   40 mg at 08/02/23 0821   polyethylene glycol (MIRALAX / GLYCOLAX) packet 17 g  17 g  Oral Daily PRN Glade Lloyd, MD       pramipexole (MIRAPEX) tablet 0.5 mg  0.5 mg Oral TID Gery Pray, MD   0.5 mg at 08/02/23 0821   psyllium (HYDROCIL/METAMUCIL) 1 packet  1 packet Oral QHS Gery Pray, MD   1 packet at 08/01/23 2107   senna-docusate (Senokot-S) tablet 1 tablet  1 tablet Oral BID Glade Lloyd, MD   1 tablet at 08/02/23 2130   spironolactone (ALDACTONE) tablet 12.5 mg  12.5 mg Oral Daily Crosley, Debby, MD   12.5 mg at 08/02/23 8657   torsemide (DEMADEX) tablet 40 mg  40 mg Oral Daily Gery Pray, MD   40 mg at 08/02/23 8469     Discharge Medications: Please see discharge summary for a list of discharge medications.  Relevant Imaging Results:  Relevant Lab Results:   Additional Information GE#:[id number]  Lorri Frederick, LCSW

## 2023-08-02 NOTE — Progress Notes (Signed)
Daily Progress Note Intern Pager: [phone]  Patient name: Maria [name] Medical record number: [id number] Date of birth: 03/14/1956 Age: 67 y.o. Gender: female  Primary Care Provider: Ignatius Specking, NP Consultants: none Code Status: full code  Pt Overview and Major Events to Date:  8/8 - Admission 8/9 - MRI under anesthesia shows spinal stenosis and degenerative changes, seen on prior imaging 8/11 - New R ankle pain, negative xray  Assessment and Plan: Maria [name] is a 67 y.o. female with a pertinent PMH of HTN, T2DM, HFrEF, and chronic pain on opiates who presented with increasing LE weakness and radicular symptoms following a fall and was admitted for MRI under general anesthesia in the setting of intractable pain and photophobia preventing normal MRI, currently receiving pain medication and awaiting placement at SNF.   [facility]     * (Principal) Fall     Mechanical fall at home prior to admission. - MRI with thoracic and lumbar disc bulging and degenerative changes - Avoid sedating medications - PT & OT, recommending SNF or CIR        Chronic low back pain     Patient has a history of chronic back pain.  Home regimen is oxycodone  10 mg q4h, gabapentin 300 mg at bedtime.  45/120 MME used 8/10. - Oxycodone 10 mg Q4h PRN for total 90 MME - Gabapentin 400 mg at bedtime - PRN tylenol 650 mg         Hypokalemia     Potassium 3.6, repleted with 40 mEq KCl PO - Daily AM BMP, replete as necessary        (HFpEF) heart failure with preserved ejection fraction (HCC)     Chronic lymphedema, continuing diuresis.  1.8 L UOP 8/10.  Last Wt  144.7 kg 8/9. - Spironolactone 12.5 mg daily - Torsemide 40 mg daily - Metoprolol succinate 25 mg daily - Strict I/Os        Ankle pain, right     New onset R ankle pain noted 8/10-8/11 overnight.  No swelling, pain  worst at calcaneus.  Xray 2 view negative for fracture or dislocation. - Baclofen 5 mg x1 overnight,  consider again if needed - Aquathermia - Ankle brace assistive device        Hypomagnesemia     Mg 1.9, ordered 2g IV -daily BMP, replete as needed        Dysuria     Pt reports burning with urination as well as intermittent confusion.  Foul smelling urine noted as well. Afebrile,  -UA with reflex to ucx  -CBC       FEN/GI: carb modified PPx: lovenox 40mg  daily Dispo: anticipate discharge today or tomorrow pending SNF placement   Subjective:  This morning, patient reports ongoing pain in bilateral lower extremities with increased pain on the right side from her back down to her foot.  She worked with PT this morning and was able to move from the bed to the chair although she says this was difficult.  States physical therapist was concerned that she might have cellulitis.  Also notes some word finding difficulty and confusion, states she was unable to remember the name of the juice she ordered for breakfast.  Says she is having some burning with urination.  PT also noticed foul odor to patient's urine, this author also noted foul smell in room during visit this AM.  Patient states she does not want  to go to the [facility] inpatient rehab facility due to having a poor experience there previously.  Explained to patient that we are working with case management to find placement for her and that it is difficult to control where she is accepted.  Objective: Temp:  [97.9 F (36.6 C)-99.6 F (37.6 C)] 98.7 F (37.1 C) (08/12 0810) Pulse Rate:  [75-84] 75 (08/12 0810) Resp:  [16-18] 18 (08/12 0512) BP: (104-123)/(42-88) 123/43 (08/12 0810) SpO2:  [89 %-99 %] 93 % (08/12 0810)  Physical Exam Constitutional:      General: She is not in acute distress.    Appearance: She is obese. She is not ill-appearing.  Cardiovascular:     Rate and Rhythm: Normal rate and regular rhythm.     Heart sounds: Normal heart sounds.  Pulmonary:     Effort: Pulmonary effort is normal.     Breath sounds: Normal  breath sounds.  Abdominal:     General: Bowel sounds are normal.     Palpations: Abdomen is soft.  Musculoskeletal:     Right lower leg: Tenderness present. 2+ Edema present.     Left lower leg: Tenderness present. 2+ Edema present.     Comments: See photos- appear similar to images taken in Feb 2024  Neurological:     Mental Status: She is alert.     Comments: Some word-finding difficulties       RLE   LLE  Laboratory: Most recent CBC Lab Results  Component Value Date   WBC 8.0 07/28/2023   HGB 10.9 (L) 07/28/2023   HCT 34.7 (L) 07/28/2023   MCV 91.8 07/28/2023   PLT 257 07/28/2023   Most recent BMP    Latest Ref Rng & Units 08/02/2023   12:29 AM  BMP  Glucose 70 - 99 mg/dL 119   BUN 8 - 23 mg/dL 17   Creatinine 1.47 - 1.00 mg/dL 8.29   Sodium 562 - 130 mmol/L 137   Potassium 3.5 - 5.1 mmol/L 3.6   Chloride 98 - 111 mmol/L 94   CO2 22 - 32 mmol/L 31   Calcium 8.9 - 10.3 mg/dL 8.2     Imaging/Diagnostic Tests: R ankle x ray: negative Maria Bender, MD 08/02/2023, 10:00 AM  PGY-1, [facility] FPTS Intern pager: [phone], text pages welcome Secure chat group [facility] Teaching Service

## 2023-08-02 NOTE — Progress Notes (Signed)
Physical Therapy Treatment Patient Details Name: Maria [name] MRN: [id number] DOB: [birth year] Today's Date: 08/02/2023   History of Present Illness Pt is 67 yo female presenting to [facility] with intractable pain s/p fall at home. MRI performed on 8/9 under sedation. PMH: Anxiety, depression, DM II, HLD, HTN, HFrEF, GERD, chrnoic arthritis and chronic pain on narcotics, LE weakness with radicular symptoms chronically.    PT Comments  Pt supine in bed with R ankle brace on foot. Removed for inspection of area and  B LE noted to have increased redness and swelling, painful to palpation. Pt requires min A and increased time to come to EoB. Attempted to come to standing with and without ankle brace on R ankle. Pt with increased pain in weight bearing and with Total A of two unable to come to standing with RW. Pt is able to perform scoot transfer from bed to drop arm recliner on her L. Pt reports no change in pain with R ankle brace in place so left off at end of session.  D/c plans remain appropriate at this time. PT will continue to follow acutely.    If plan is discharge home, recommend the following: A lot of help with walking and/or transfers;A lot of help with bathing/dressing/bathroom;Assistance with cooking/housework;Assist for transportation;Help with stairs or ramp for entrance;Supervision due to cognitive status   Can travel by private vehicle     No        Precautions / Restrictions Precautions Precautions: Fall Restrictions Weight Bearing Restrictions: No     Mobility  Bed Mobility Overal bed mobility: Needs Assistance Bed Mobility: Supine to Sit     Supine to sit: Min assist, HOB elevated, Used rails     General bed mobility comments: some assist to scoot hips but significant time/effort needed to complete task    Transfers Overall transfer level: Needs assistance Equipment used: Rolling walker (2 wheels), None Transfers: Sit to/from Stand, Bed to  chair/wheelchair/BSC Sit to Stand: Total assist, +2 physical assistance, +2 safety/equipment          Lateral/Scoot Transfers: Mod assist, Min assist, +2 physical assistance, +2 safety/equipment General transfer comment: attempted standing with and without soft ankle orthotic that was ordered by MD though pt still unable to WB to lift bottom off of bed. opted to scoot to drop arm recliner with initial Mod A x 2 needed to lift/scoot bottom with pad progressing to Min A x 2 with pt pulling on armrest, able to scoot back in chair fairly well        Balance Overall balance assessment: Needs assistance Sitting-balance support: No upper extremity supported, Feet supported Sitting balance-Leahy Scale: Fair         Standing balance comment: unable to lift hips from bed                            Cognition Arousal: Alert Behavior During Therapy: WFL for tasks assessed/performed Overall Cognitive Status: Impaired/Different from baseline Area of Impairment: Memory, Awareness                     Memory: Decreased short-term memory     Awareness: Emergent   General Comments: some minor memory deficits and confusion noted w/ statements. pt functional, aware of pain/deficits but decreased insight into mgmt of deficits           General Comments General comments (skin integrity, edema, etc.): Pt with increased redness and  swelling in LE and ankles, painful to touch more so than to joint mobilization although both limiting mobilization, utilized ankle brace for increased support during weightbearing however provided no pain relief and uncomfortable due to swelling      Pertinent Vitals/Pain Pain Assessment Pain Assessment: Faces Faces Pain Scale: Hurts whole lot Pain Location: R foot/ankle > L Pain Descriptors / Indicators: Sore, Guarding, Grimacing, Sharp Pain Intervention(s): Monitored during session, Repositioned, RN gave pain meds during session     PT  Goals (current goals can now be found in the care plan section) Acute Rehab PT Goals PT Goal Formulation: With patient Time For Goal Achievement: 08/13/23 Potential to Achieve Goals: Fair Progress towards PT goals: Not progressing toward goals - comment    Frequency    Min 1X/week      PT Plan      Co-evaluation PT/OT/SLP Co-Evaluation/Treatment: Yes Reason for Co-Treatment: For patient/therapist safety;To address functional/ADL transfers PT goals addressed during session: Mobility/safety with mobility;Proper use of DME OT goals addressed during session: ADL's and self-care;Proper use of Adaptive equipment and DME      AM-PAC PT "6 Clicks" Mobility   Outcome Measure  Help needed turning from your back to your side while in a flat bed without using bedrails?: A Little Help needed moving from lying on your back to sitting on the side of a flat bed without using bedrails?: A Lot Help needed moving to and from a bed to a chair (including a wheelchair)?: Total Help needed standing up from a chair using your arms (e.g., wheelchair or bedside chair)?: Total Help needed to walk in hospital room?: Total Help needed climbing 3-5 steps with a railing? : Total 6 Click Score: 9    End of Session Equipment Utilized During Treatment: Gait belt Activity Tolerance: Patient limited by pain Patient left: in chair;with call bell/phone within reach;with chair alarm set Nurse Communication: Mobility status PT Visit Diagnosis: Other abnormalities of gait and mobility (R26.89);Unsteadiness on feet (R26.81);Muscle weakness (generalized) (M62.81);History of falling (Z91.81)     Time: 4696-2952 PT Time Calculation (min) (ACUTE ONLY): 41 min  Charges:    $Therapeutic Activity: 8-22 mins PT General Charges $$ ACUTE PT VISIT: 1 Visit                      B. Beverely Risen PT, DPT Acute Rehabilitation Services Please use secure chat or  Call Office [phone]    Elon Alas  [facility] 08/02/2023, 9:40 AM

## 2023-08-02 NOTE — Progress Notes (Signed)
Bath done

## 2023-08-02 NOTE — Progress Notes (Signed)
Occupational Therapy Treatment Patient Details Name: Maria [name] MRN: [id number] DOB: 1956/03/28 Today's Date: 08/02/2023   History of present illness Pt is 67 yo female presenting to [facility] with intractable pain s/p fall at home. MRI performed on 8/9 under sedation. PMH: Anxiety, depression, DM II, HLD, HTN, HFrEF, GERD, chrnoic arthritis and chronic pain on narcotics, LE weakness with radicular symptoms chronically.   OT comments  Pt remains limited by pain though pain noted in R ankle/foot primarily today. Due to pain, pt unable to WB through B feet and unable to stand as she did on eval. Pt able to scoot to drop arm recliner with +2 assist for safety. Noted edema w/ socks and newly placed ankle orthotic - adjusted and elevated BLE at end of session. Patient will benefit from continued inpatient follow up therapy, <3 hours/day at DC as pt unable to safely manage ADLs or access the 6 steps into her apartment due to current pain levels.       If plan is discharge home, recommend the following:  A lot of help with bathing/dressing/bathroom;Two people to help with walking and/or transfers   Equipment Recommendations  None recommended by OT    Recommendations for Other Services      Precautions / Restrictions Precautions Precautions: Fall Restrictions Weight Bearing Restrictions: No       Mobility Bed Mobility Overal bed mobility: Needs Assistance Bed Mobility: Supine to Sit     Supine to sit: Min assist, HOB elevated, Used rails     General bed mobility comments: some assist to scoot hips but significant time/effort needed to complete task    Transfers Overall transfer level: Needs assistance Equipment used: Rolling walker (2 wheels), None Transfers: Sit to/from Stand, Bed to chair/wheelchair/BSC Sit to Stand: Total assist, +2 physical assistance, +2 safety/equipment          Lateral/Scoot Transfers: Mod assist, Min assist, +2 physical assistance, +2  safety/equipment General transfer comment: attempted standing with and without soft ankle orthotic that was ordered by MD though pt still unable to WB to lift bottom off of bed. opted to scoot to drop arm recliner with initial Mod A x 2 needed to lift/scoot bottom with pad progressing to Min A x 2 with pt pulling on armrest, able to scoot back in chair fairly well     Balance Overall balance assessment: Needs assistance Sitting-balance support: No upper extremity supported, Feet supported Sitting balance-Leahy Scale: Fair                                     ADL either performed or assessed with clinical judgement   ADL Overall ADL's : Needs assistance/impaired Eating/Feeding: Independent                   Lower Body Dressing: Total assistance;Bed level Lower Body Dressing Details (indicate cue type and reason): sock/brace assistance               General ADL Comments: New pain in R ankle/foot > LLE limiting. pt unable to stand today    Extremity/Trunk Assessment Upper Extremity Assessment Upper Extremity Assessment: Generalized weakness   Lower Extremity Assessment Lower Extremity Assessment: Defer to PT evaluation        Vision   Vision Assessment?: No apparent visual deficits   Perception     Praxis      Cognition Arousal: Alert Behavior During Therapy: [facility]  for tasks assessed/performed Overall Cognitive Status: Impaired/Different from baseline Area of Impairment: Memory, Awareness                     Memory: Decreased short-term memory     Awareness: Emergent   General Comments: some minor memory deficits and confusion noted w/ statements. pt functional, aware of pain/deficits but decreased insight into mgmt of deficits        Exercises      Shoulder Instructions       General Comments Noted foul smelling urine odor - inquiring to MD about potential UTI workup to rule out? Noted increased swelling and ankle orthotic  had been placed and left on with some indentations noted; left off at end of session    Pertinent Vitals/ Pain       Pain Assessment Pain Assessment: Faces Faces Pain Scale: Hurts whole lot Pain Location: R foot/ankle > L Pain Descriptors / Indicators: Sore, Guarding, Grimacing, Sharp Pain Intervention(s): Monitored during session, RN gave pain meds during session, Limited activity within patient's tolerance, Repositioned  Home Living                                          Prior Functioning/Environment              Frequency  Min 1X/week        Progress Toward Goals  OT Goals(current goals can now be found in the care plan section)  Progress towards OT goals: OT to reassess next treatment  Acute Rehab OT Goals Patient Stated Goal: decrease pain, be able to go home OT Goal Formulation: With patient Time For Goal Achievement: 08/13/23 Potential to Achieve Goals: Good ADL Goals Pt Will Perform Lower Body Bathing: with contact guard assist;sit to/from stand;sitting/lateral leans;with adaptive equipment Pt Will Perform Lower Body Dressing: with contact guard assist;sitting/lateral leans;sit to/from stand;with adaptive equipment Pt Will Transfer to Toilet: with min assist;ambulating Pt Will Perform Toileting - Clothing Manipulation and hygiene: with contact guard assist;with adaptive equipment;sitting/lateral leans;sit to/from stand  Plan      Co-evaluation    PT/OT/SLP Co-Evaluation/Treatment: Yes Reason for Co-Treatment: For patient/therapist safety;To address functional/ADL transfers   OT goals addressed during session: ADL's and self-care;Proper use of Adaptive equipment and DME      AM-PAC OT "6 Clicks" Daily Activity     Outcome Measure   Help from another person eating meals?: None Help from another person taking care of personal grooming?: A Little Help from another person toileting, which includes using toliet, bedpan, or urinal?: A  Lot Help from another person bathing (including washing, rinsing, drying)?: A Lot Help from another person to put on and taking off regular upper body clothing?: A Little Help from another person to put on and taking off regular lower body clothing?: Total 6 Click Score: 15    End of Session Equipment Utilized During Treatment: Gait belt  OT Visit Diagnosis: Unsteadiness on feet (R26.81);Other abnormalities of gait and mobility (R26.89);Muscle weakness (generalized) (M62.81)   Activity Tolerance Patient limited by pain   Patient Left in chair;with call bell/phone within reach;with chair alarm set   Nurse Communication          Time: [phone] OT Time Calculation (min): 41 min  Charges: OT General Charges $OT Visit: 1 Visit OT Treatments $Therapeutic Activity: 23-37 mins  Bradd Canary, OTR/L Acute Rehab Services Office:  [phone]   Lorre Munroe 08/02/2023, 9:16 AM

## 2023-08-02 NOTE — Plan of Care (Signed)

## 2023-08-02 NOTE — TOC Initial Note (Signed)
Transition of Care [facility]) - Initial/Assessment Note    Patient Details  Name: Maria [name] MRN: [id number] Date of Birth: [birth year]  Transition of [facility]) CM/SW Contact:    Lorri Frederick, LCSW Phone Number: 08/02/2023, 1:10 PM  Clinical Narrative:     CSW met with pt regarding PT recommendation for SNF.  Pt is agreeable, medicare choice document provided, permission given to send out referral in hub.  Permission given to speak to daughter Patsy Lager (lives in [place]) and to sister Harriett Sine (lives here)  Pt from home alone, no current services.  Referral sent out in hub.              Expected Discharge Plan: Skilled Nursing Facility Barriers to Discharge: Continued Medical Work up, SNF Pending bed offer   Patient Goals and CMS Choice Patient states their goals for this hospitalization and ongoing recovery are:: walk again, get home CMS Medicare.gov Compare Post Acute Care list provided to:: Patient Choice offered to / list presented to : Patient      Expected Discharge Plan and Services In-house Referral: Clinical Social Work   Post Acute Care Choice: Skilled Nursing Facility Living arrangements for the past 2 months: Single Family Home                                      Prior Living Arrangements/Services Living arrangements for the past 2 months: Single Family Home Lives with:: Self Patient language and need for interpreter reviewed:: Yes Do you feel safe going back to the place where you live?: Yes      Need for Family Participation in Patient Care: No (Comment) Care giver support system in place?: Yes (comment) Current home services: Other (comment) (none) Criminal Activity/Legal Involvement Pertinent to Current Situation/Hospitalization: No - Comment as needed  Activities of Daily Living Home Assistive Devices/Equipment: Walker (specify type) ADL Screening (condition at time of admission) Patient's cognitive ability adequate to safely complete daily  activities?: Yes Is the patient deaf or have difficulty hearing?: No Does the patient have difficulty seeing, even when wearing glasses/contacts?: Yes Does the patient have difficulty concentrating, remembering, or making decisions?: Yes Patient able to express need for assistance with ADLs?: Yes Does the patient have difficulty dressing or bathing?: No Independently performs ADLs?: Yes (appropriate for developmental age) Does the patient have difficulty walking or climbing stairs?: Yes Weakness of Legs: Both Weakness of Arms/Hands: None  Permission Sought/Granted Permission sought to share information with : Family Supports Permission granted to share information with : Yes, Verbal Permission Granted  Share Information with NAME: Patsy Lager, daughter.  Harriett Sine, sister  Permission granted to share info w AGENCY: SNF        Emotional Assessment Appearance:: Appears stated age Attitude/Demeanor/Rapport: Engaged Affect (typically observed): Appropriate Orientation: : Oriented to Self, Oriented to Place, Oriented to  Time, Oriented to Situation      Admission diagnosis:  Fall [W19.XXXA] Patient Active Problem List   Diagnosis Date Noted   Hypomagnesemia 08/02/2023   Dysuria 08/02/2023   Ankle pain, right 08/01/2023   Weakness of both lower extremities 07/30/2023   Morbid obesity (HCC) 07/30/2023   General weakness 07/15/2023   (HFpEF) heart failure with preserved ejection fraction (HCC) 07/15/2023   Abdominal pain 07/15/2023   AKI (acute kidney injury) (HCC) 07/15/2023   Acute on chronic respiratory failure with hypoxia and hypercapnia (HCC) 02/08/2023   Pneumonia due to infectious  organism 02/05/2023   Chronic right-sided congestive heart failure (HCC) 02/04/2023   Fall 02/02/2023   UTI (urinary tract infection) 07/22/2022   Weakness 07/22/2022   Fluid overload 07/21/2022   Acute cystitis with hematuria    Dyspnea 12/19/2021   Leg swelling 11/20/2021   Bilateral knee pain  10/29/2021   Hypernatremia 09/24/2021   Cellulitis of lower extremity 09/24/2021   CKD (chronic kidney disease), stage IIIa (HCC) 09/24/2021   Sacral decubitus ulcer, stage II (HCC) 09/20/2021   Acute on chronic heart failure with preserved ejection fraction (HFpEF) (HCC) 09/19/2021   Nail disorder 07/07/2021   Pre-ulcerative corn or callous 07/07/2021   Rash 07/07/2021   Acute kidney injury superimposed on chronic kidney disease (HCC) 04/06/2021   Diastolic congestive heart failure (HCC) 03/17/2021   Diabetes mellitus type 2 in obese 03/11/2021   Vitamin D deficiency 01/01/2021   Medial epicondylitis 12/31/2020   Aortic atherosclerosis (HCC) 09/26/2020   Umbilical hernia 09/26/2020   Constipation 08/05/2020   Arthritis 06/12/2020   Hoarseness 05/01/2020   B12 deficiency 01/31/2020   Rosacea 01/31/2020   Neck swelling 01/31/2020   History of colonic polyps    Benign neoplasm of [place]    Nausea 08/31/2019   Throat pain 07/26/2019   HLD (hyperlipidemia) 05/24/2019   Leg cramps 05/24/2019   Lump in neck 05/24/2019   Left thyroid nodule 01/25/2019   Jerking movements of extremities 07/20/2018   Balance disorder 07/20/2018   Right knee pain 03/21/2018   OSA (obstructive sleep apnea) 03/21/2018   Oxygen desaturation 03/21/2018   Urinary symptom or sign 03/21/2018   Lymphedema 01/25/2018   Gait disorder 01/25/2018   Dysphonia 12/07/2017   Encounter for well adult exam with abnormal findings 11/08/2017   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   Ovens polyp 08/11/2016   Abdominal pain, epigastric    Dysphagia    [place] cancer screening    ACE-inhibitor cough 12/05/2015   Restless legs syndrome 10/08/2015   Venous stasis dermatitis of both lower extremities 04/26/2015   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/17/2014   AR (allergic rhinitis) 10/17/2014   Fatty liver 10/17/2014   Chronic pain syndrome 09/19/2014   Post-traumatic osteoarthritis of both knees 09/19/2014   Spinal  stenosis 09/19/2014   Depression, major, single episode, complete remission (HCC) 09/19/2014   Narcotic dependence (HCC) 09/19/2014   Acute hypoxic respiratory failure (HCC) 08/20/2014   False positive HIV serology 06/02/2013   Hypokalemia 06/29/2012   Spondylosis of 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 03/09/2012   Lumbar disc disease 03/09/2012   Chronic pain 03/09/2012   Morbid obesity due to excess calories (HCC) 06/17/2011   Chronic low back pain 12/28/2008   Anxiety state 12/06/2007   Depression 12/06/2007   HTN (hypertension) 12/06/2007   Gastroesophageal reflux disease without esophagitis 12/06/2007   LOW BACK PAIN 12/06/2007   PCP:  Ignatius Specking, NP Pharmacy:   [facility] [phone] - [place], [place] - [address] [place] [address] Phone: [phone] Fax: [phone]  [facility] - [place], [place] - [address] [place] [address] Phone: [phone] Fax: [phone]  [facility] [address] [place], [place] - [address] [place], [place] [address] [place], [place] [address] [place] [address] Phone: [phone] Fax: [phone]     Social Determinants of Health (SDOH) Social History: SDOH Screenings   Food Insecurity: No Food Insecurity (07/29/2023)  Housing: Low Risk  (07/29/2023)  Transportation  Needs: No Transportation Needs (07/29/2023)  Utilities: Not At Risk (07/29/2023)  Alcohol Screen: Low Risk  (12/30/2021)  Depression (PHQ2-9): Medium Risk (01/30/2022)  Financial Resource Strain: Medium Risk (12/30/2021)  Physical Activity: Inactive (12/30/2021)  Social Connections: Unknown (09/17/2022)   Received from [facility] [place] [facility]  Stress: No Stress Concern Present (12/30/2021)  Tobacco Use: Medium Risk (07/30/2023)   SDOH Interventions:     Readmission Risk Interventions    07/22/2022    2:59 PM 04/11/2021   11:40 AM  Readmission Risk Prevention Plan   Transportation Screening Complete Complete  PCP or Specialist Appt within 3-5 Days Complete Complete  HRI or Home Care Consult Complete Complete  Social Work Consult for Recovery Care Planning/Counseling Complete Complete  Palliative Care Screening Complete Not Applicable  Medication Review Oceanographer) Complete Complete

## 2023-08-02 NOTE — Assessment & Plan Note (Signed)
Pt reports burning with urination as well as intermittent confusion. Foul smelling urine noted as well. Afebrile,  -UA with reflex to ucx  -CBC

## 2023-08-02 NOTE — Assessment & Plan Note (Signed)
Mg 1.9, ordered 2g IV -daily BMP, replete as needed

## 2023-08-02 NOTE — Progress Notes (Signed)
Initial Nutrition Assessment  DOCUMENTATION CODES:   Morbid obesity  INTERVENTION:  Glucerna Shake po BID, each supplement provides 220 kcal and 10 grams of protein MVI with minerals daily Protein containing snacks ideas added to AVS  NUTRITION DIAGNOSIS:   Predicted suboptimal nutrient intake (protein) related to other (see comment) (decreased hunger cues) as evidenced by per patient/family report, other (comment) (dietary recall)  GOAL:   Patient will meet greater than or equal to 90% of their needs  MONITOR:   PO intake, Supplement acceptance, Labs, Weight trends  REASON FOR ASSESSMENT:   Malnutrition Screening Tool    ASSESSMENT:   Pt admitted with increased LE weakness and radicular symptoms following a fall. PMH significant for HTN, T2DM, HFrEF and chronic pain on opiates.   Discharge pending SNF placement.   Spoke with pt at bedside. She reports that her appetite is fair. She has not had much of a taste for hospital food but continues to try to eat.   At home she typically eats 2 meals per day. She does not often feel hungry. She occasionally misses breakfast 2-3 times per week. When she eats breakfast this meal consists of hard boiled eggs, cereal or oatmeal. If she eats a late breakfast, she does not eat lunch. Dinner typically consists of a [employer]. She needs easy-to-prepare meals d/t difficulty standing long to prepare foods. She tries to drink 1 Glucerna shake daily.   Pt reports that she receives food stamps as she is on disability but does not have difficulty with access to foods. She shops via [employer] and monitors food labels to ensure she is able to reduce her sodium intake and monitors grams of carbohydrates in meals to maintain blood sugars. In the morning she weighs herself, checks her BP and her blood sugar as soon as she gets up. Her blood sugar have ranged between 97-105. She does not use insulin at home. Per review of chart, pt appears to use  mounjaro.  Meal completions:: 8/9: 100% lunch 8/10: 75% breakfast 8/11: 75% breakfast  Over the last year, pt endorses a weight loss from 380 lbs down to about 327 lbs d/t changes in her diet and being more aware of foods that she is eating. She has been adding in fruits and vegetables to her diet more frequently as well.   Per review of weight history, pt's weight has been trending down over the last year and a half. Noted a weight loss of 8.1% over the last year which is not clinically significant for time frame.   Medications: Vitamin B12, SSI 0-15 units TID, SSI 0-5 units at bedtime, protonix, psyllium, senna, torsemide  Labs: potassium 3.3, Cr 1.34, GFR 44, CBG's 98-157 x24 hours, HgbA1c 6.0% (07/25)  NUTRITION - FOCUSED PHYSICAL EXAM:  Flowsheet Row Most Recent Value  Orbital Region No depletion  Upper Arm Region No depletion  Thoracic and Lumbar Region No depletion  Buccal Region No depletion  Temple Region No depletion  Clavicle Bone Region No depletion  Clavicle and Acromion Bone Region No depletion  Scapular Bone Region No depletion  Dorsal Hand No depletion  Patellar Region No depletion  Anterior Thigh Region No depletion  Posterior Calf Region No depletion  Edema (RD Assessment) Mild  [BLE- lymphedema]  Hair Other (Comment)  [pt reports falling out]  Eyes Reviewed  Mouth Reviewed  Skin Reviewed  Nails Reviewed       Diet Order:   Diet Order  Diet Carb Modified Fluid consistency: Thin; Room service appropriate? Yes  Diet effective now                   EDUCATION NEEDS:   Education needs have been addressed  Skin:  Skin Assessment: Reviewed RN Assessment (R vaginal skin tear)  Last BM:  8/12  Height:   Ht Readings from Last 1 Encounters:  07/30/23 5\' 3"  (1.6 m)    Weight:   Wt Readings from Last 1 Encounters:  07/30/23 (!) 144.7 kg    Ideal Body Weight:  52.3 kg  BMI:  Body mass index is 56.51 kg/m.  Estimated  Nutritional Needs:   Kcal:  1500-1700  Protein:  75-90g  Fluid:  >/=1.5L  Drusilla Kanner, RDN, LDN Clinical Nutrition

## 2023-08-02 NOTE — Plan of Care (Signed)
  Problem: Health Behavior/Discharge Planning: Goal: Ability to manage health-related needs will improve Outcome: Progressing   Problem: Metabolic: Goal: Ability to maintain appropriate glucose levels will improve Outcome: Progressing   Problem: Nutritional: Goal: Maintenance of adequate nutrition will improve Outcome: Progressing   

## 2023-08-02 NOTE — Progress Notes (Signed)
Occupational Therapy Treatment Patient Details Name: Maria [name] MRN: [id number] DOB: 01-Jan-1956 Today's Date: 08/02/2023   History of present illness Pt is 67 yo female presenting to [facility] with intractable pain s/p fall at home. MRI performed on 8/9 under sedation. PMH: Anxiety, depression, DM II, HLD, HTN, HFrEF, GERD, chrnoic arthritis and chronic pain on narcotics, LE weakness with radicular symptoms chronically.   OT comments  Assisted RN staff with patient from recliner back to bed. Failed attempt to transfer to [facility] (this DME is not wide enough to accommodate her body habitus, and she will require a drop arm version in her current state) She was max A +2 to lateral scoot slightly "uphill" from recliner back to bed, and then required max A +2 helicopter technique to return supine prior to max A for peri care at the bed level. Current POC remains appropriate, and Pt will require post-acute rehab of <3 hours daily prior to return home. OT will continue to follow acutely.       If plan is discharge home, recommend the following:  A lot of help with bathing/dressing/bathroom;Two people to help with walking and/or transfers   Equipment Recommendations  None recommended by OT    Recommendations for Other Services      Precautions / Restrictions Precautions Precautions: Fall Precaution Comments: body habitus Restrictions Weight Bearing Restrictions: No       Mobility Bed Mobility Overal bed mobility: Needs Assistance Bed Mobility: Sit to Supine       Sit to supine: Max assist, +2 for physical assistance, +2 for safety/equipment   General bed mobility comments: helicopter technique used for safety    Transfers Overall transfer level: Needs assistance Equipment used: Rolling walker (2 wheels), None Transfers: Sit to/from Stand, Bed to chair/wheelchair/BSC Sit to Stand: +2 physical assistance, +2 safety/equipment, Max assist (heavy use of pad and momentum to facilitate, Pt  unable to come fully upright)          Lateral/Scoot Transfers: +2 physical assistance, +2 safety/equipment, Max assist (from chair to bed (slightly uphill))       Balance Overall balance assessment: Needs assistance Sitting-balance support: No upper extremity supported, Feet supported Sitting balance-Leahy Scale: Fair         Standing balance comment: unable to lift hips from bed                           ADL either performed or assessed with clinical judgement   ADL Overall ADL's : Needs assistance/impaired Eating/Feeding: Independent   Grooming: Wash/dry face;Set up;Bed level               Lower Body Dressing: Total assistance;Bed level Lower Body Dressing Details (indicate cue type and reason): sock/brace assistance Toilet Transfer: Maximal assistance;+2 for physical assistance;+2 for safety/equipment;Squat-pivot;Requires drop arm;Requires wide/bariatric Toilet Transfer Details (indicate cue type and reason): simulated through return to bed, Pt is unable to use regular BSC in room. hips will not fit - and she requires drop arm commode Toileting- Clothing Manipulation and Hygiene: +2 for safety/equipment;Bed level;Maximal assistance;+2 for physical assistance       Functional mobility during ADLs: Maximal assistance;+2 for physical assistance;+2 for safety/equipment (for lateral transfer with drop arm chair only - heavy use of pad to facilitate and gait belt) General ADL Comments: New pain in R ankle/foot > LLE limiting. pt unable to stand today    Extremity/Trunk Assessment Upper Extremity Assessment Upper Extremity Assessment: Generalized weakness  Lower Extremity Assessment Lower Extremity Assessment: Defer to PT evaluation        Vision   Vision Assessment?: No apparent visual deficits   Perception     Praxis      Cognition Arousal: Alert Behavior During Therapy: WFL for tasks assessed/performed Overall Cognitive Status:  Impaired/Different from baseline Area of Impairment: Memory, Awareness                     Memory: Decreased short-term memory     Awareness: Emergent   General Comments: some minor memory deficits and confusion noted w/ statements. pt functional, aware of pain/deficits but decreased insight into mgmt of deficits        Exercises      Shoulder Instructions       General Comments Pt with increased redness and swelling in LE and ankles, painful to touch more so than to joint mobilization although both limiting mobilization, utilized ankle brace for increased support during weightbearing however provided no pain relief and uncomfortable due to swelling    Pertinent Vitals/ Pain       Pain Assessment Pain Assessment: Faces Faces Pain Scale: Hurts whole lot Pain Location: R foot/ankle > L Pain Descriptors / Indicators: Sore, Guarding, Grimacing, Sharp Pain Intervention(s): Monitored during session, Repositioned  Home Living                                          Prior Functioning/Environment              Frequency  Min 1X/week        Progress Toward Goals  OT Goals(current goals can now be found in the care plan section)  Progress towards OT goals: OT to reassess next treatment  Acute Rehab OT Goals Patient Stated Goal: decrease pain, be able to walk again OT Goal Formulation: With patient Time For Goal Achievement: 08/13/23 Potential to Achieve Goals: Fair ADL Goals Pt Will Perform Lower Body Bathing: with contact guard assist;sit to/from stand;sitting/lateral leans;with adaptive equipment Pt Will Perform Lower Body Dressing: with contact guard assist;sitting/lateral leans;sit to/from stand;with adaptive equipment Pt Will Transfer to Toilet: with min assist;ambulating Pt Will Perform Toileting - Clothing Manipulation and hygiene: with contact guard assist;with adaptive equipment;sitting/lateral leans;sit to/from stand  Plan       Co-evaluation    PT/OT/SLP Co-Evaluation/Treatment: Yes Reason for Co-Treatment: For patient/therapist safety;To address functional/ADL transfers PT goals addressed during session: Mobility/safety with mobility;Proper use of DME OT goals addressed during session: ADL's and self-care;Proper use of Adaptive equipment and DME      AM-PAC OT "6 Clicks" Daily Activity     Outcome Measure   Help from another person eating meals?: None Help from another person taking care of personal grooming?: A Little Help from another person toileting, which includes using toliet, bedpan, or urinal?: A Lot Help from another person bathing (including washing, rinsing, drying)?: A Lot Help from another person to put on and taking off regular upper body clothing?: A Little Help from another person to put on and taking off regular lower body clothing?: Total 6 Click Score: 15    End of Session Equipment Utilized During Treatment: Gait belt;Rolling walker (2 wheels)  OT Visit Diagnosis: Unsteadiness on feet (R26.81);Other abnormalities of gait and mobility (R26.89);Muscle weakness (generalized) (M62.81)   Activity Tolerance Patient limited by pain   Patient Left in bed;with call  bell/phone within reach;with bed alarm set;with nursing/sitter in room   Nurse Communication Mobility status;Need for lift equipment        Time: 1324-4010 OT Time Calculation (min): 31 min  Charges: OT General Charges $OT Visit: 1 Visit OT Treatments $Self Care/Home Management : 23-37 mins $Therapeutic Activity: 23-37 mins  Nyoka Cowden OTR/L Acute Rehabilitation Services Office: [phone]   Evern Bio [facility] 08/02/2023, 11:39 AM

## 2023-08-02 NOTE — Progress Notes (Signed)
Notified attending team about blood pressure.

## 2023-08-03 DIAGNOSIS — W19XXXD Unspecified fall, subsequent encounter: Secondary | ICD-10-CM | POA: Diagnosis not present

## 2023-08-03 DIAGNOSIS — G8929 Other chronic pain: Secondary | ICD-10-CM | POA: Diagnosis not present

## 2023-08-03 DIAGNOSIS — M5440 Lumbago with sciatica, unspecified side: Secondary | ICD-10-CM | POA: Diagnosis not present

## 2023-08-03 LAB — GLUCOSE, CAPILLARY
Glucose-Capillary: 98 mg/dL (ref 70–99)
Glucose-Capillary: 116 mg/dL — ABNORMAL HIGH (ref 70–99)
Glucose-Capillary: 146 mg/dL — ABNORMAL HIGH (ref 70–99)

## 2023-08-03 MED ORDER — GABAPENTIN 400 MG PO CAPS: 400.0000 mg | ORAL_CAPSULE | Freq: Every day | ORAL | Status: DC

## 2023-08-03 MED ORDER — ADULT MULTIVITAMIN W/MINERALS CH
1.0000 | ORAL_TABLET | Freq: Every day | ORAL | Status: DC
  Administered 2023-08-03: 1 via ORAL
  Filled 2023-08-03: qty 1

## 2023-08-03 MED ORDER — GLUCERNA SHAKE PO LIQD
237.0000 mL | Freq: Two times a day (BID) | ORAL | Status: DC
  Administered 2023-08-03: 237 mL via ORAL

## 2023-08-03 MED ORDER — POTASSIUM CHLORIDE CRYS ER 20 MEQ PO TBCR
20.0000 meq | EXTENDED_RELEASE_TABLET | Freq: Once | ORAL | Status: AC
  Administered 2023-08-03: 20 meq via ORAL
  Filled 2023-08-03: qty 1

## 2023-08-03 MED ORDER — POTASSIUM CHLORIDE CRYS ER 20 MEQ PO TBCR
40.0000 meq | EXTENDED_RELEASE_TABLET | Freq: Once | ORAL | Status: AC
  Administered 2023-08-03: 40 meq via ORAL
  Filled 2023-08-03: qty 2

## 2023-08-03 MED ORDER — PHENOL 1.4 % MT LIQD
1.0000 | OROMUCOSAL | Status: DC | PRN
  Administered 2023-08-03: 1 via OROMUCOSAL
  Filled 2023-08-03: qty 354

## 2023-08-03 NOTE — Progress Notes (Signed)
RN report called to [facility] and given to [place]. No questions or concerns voiced at this time and all belongings accounted for and packed. Awaiting PTAR for transport to facility.

## 2023-08-03 NOTE — Care Management Important Message (Signed)
Important Message  Patient Details  Name: Maria [name] MRN: [id number] Date of Birth: 06-24-1956   Medicare Important Message Given:  Yes     Sherilyn Banker 08/03/2023, 4:23 PM

## 2023-08-03 NOTE — Assessment & Plan Note (Addendum)
Pt reports burning with urination as well as intermittent confusion on 8/12.  -denies dysuria this morning -UA positive for leukocytes, rare negative for nitrites -WCC 10.8 -pt remains afebrile -no treatment indicated

## 2023-08-03 NOTE — Assessment & Plan Note (Signed)
Patient has a history of chronic back pain.  Home regimen is oxycodone 10 mg q4h, gabapentin 300 mg at bedtime.  45/120 MME used 8/10. - Oxycodone 10 mg Q4h PRN for total 90 MME - Gabapentin 400 mg at bedtime - PRN tylenol 650 mg

## 2023-08-03 NOTE — Discharge Instructions (Addendum)
Tips for Adding Protein Patients may be advised to increase the protein in their diet but not necessarily the calories as well. However, note that when adding protein to your diet, you will also be adding extra calories. The following suggestions may help add the extra protein while keeping the calories as low as possible. Tips Add extra egg to one or more meals Increase the portion of milk to drink and change to skim milk if able Include [place] yogurt or cottage cheese for snack or part of a meal Increase portion size of protein entre and decrease portion of starch/bread Mix protein powder, nut butter, almond/nut milk, non-fat dry milk, or Greek yogurt to shakes and smoothies Use these ingredients also in baked goods or other recipes Use double the amount of sandwich filling Add protein foods to all snacks including cheese, nut butters, milk and yogurt   Food Tips for Including Protein  Beans Cook and use dried peas, beans, and tofu in soups or add to casseroles, pastas, and grain dishes that also contain cheese or meat Mash with cheese and milk Use tofu to make smoothies  Commercial Protein Supplements Use nutritional supplements or protein powder sold at pharmacies and grocery stores Use protein powder in milk drinks and desserts, such as pudding Mix with ice cream, milk, and fruit or other flavorings for a high-protein milkshake  Cottage Cheese or [employer] Mix with or use to stuff fruits and vegetables Add to casseroles, spaghetti, noodles, or egg dishes such as omelets, scrambled eggs, and souffls Use gelatin, pudding-type desserts, cheesecake, and pancake or waffle batter Use to stuff crepes, pasta shells, or manicotti Puree and use as a substitute for sour cream  Eggs, Egg whites, and Egg Yolks Add chopped, hard-cooked eggs to salads and dressings, vegetables, casseroles, and creamed meats Beat eggs into mashed potatoes, vegetable purees, and sauces Add extra egg whites to  quiches, scrambled eggs, custards, puddings, pancake batter, or [place] toast wash/batter Make a rich custard with egg yolks, double strength milk, and sugar Add extra hard-cooked yolks to deviled egg filling and sandwich spreads  Hard or Semi-Soft Cheese (Cheddar, Ree Kida, Wilkinson) Melt on sandwiches, bread, muffins, tortillas, hamburgers, hot dogs, other meats or fish, vegetables, eggs, or desserts such as stewed figs or pies Grate and add to soups, sauces, casseroles, vegetable dishes, potatoes, rice noodles, or meatloaf Serve as a snack with crackers or bagels  Ice cream, Yogurt, and Frozen Yogurt Add to milk drinks such as milkshakes Add to cereals, fruits, gelatin desserts, and pies Blend or whip with soft or cooked fruits Sandwich ice cream or frozen yogurt between enriched cake slices, cookies, or graham crackers Use seasoned yogurt as a dip for fruits, vegetables, or chips Use yogurt in place of sour cream in casseroles  Meat and Fish Add chopped, cooked meat or fish to vegetables, salads, casseroles, soups, sauces, and biscuit dough Use in omelets, souffls, quiches, and sandwich fillings Add chicken and [place] to stuffing Wrap in pie crust or biscuit dough as turnovers Add to stuffed baked potatoes Add pureed meat to soups  Milk Use in beverages and in cooking Use in preparing foods, such as hot cereal, soups, cocoa, or pudding Add cream sauces to vegetable and other dishes Use evaporated milk, evaporated skim milk, or sweetened condensed milk instead of milk or water in recipes.  Nonfat Dry Milk Add 1/3 cup of nonfat dry milk powdered milk to each cup of regular milk for "double strength" milk Add to yogurt and  milk drinks, such as pasteurized eggnog and milkshakes Add to scrambled eggs and mashed potatoes Use in casseroles, meatloaf, hot cereal, breads, muffins, sauces, cream soups, puddings and custards, and other milk-based desserts  Nuts, Seeds, and Wheat Germ Add to casseroles,  breads, muffins, pancakes, cookies, and waffles Sprinkle on fruit, cereal, ice cream, yogurt, vegetables, salads, and toast as a crunchy topping Use in place of breadcrumbs Blend with parsley or spinach, herbs, and cream for a noodle, pasta, or vegetable sauce. Roll banana in chopped nuts  Peanut Butter Spread on sandwiches, toast, muffins, crackers, waffles, pancakes, and fruit slices Use as a dip for raw vegetables, such as carrots, cauliflower, and celery Blend with milk drinks, smoothies, and other beverages Swirl through soft ice cream or yogurt Spread on a banana then roll in crushed, dry cereal or chopped nuts

## 2023-08-03 NOTE — Progress Notes (Signed)
Daily Progress Note Intern Pager: [phone]  Patient name: Maria [name] Medical record number: [id number] Date of birth: 06/22/1956 Age: 67 y.o. Gender: female  Primary Care Provider: Ignatius Specking, NP Consultants: none Code Status: full code  Pt Overview and Major Events to Date:  8/8 - Admission 8/9 - MRI under anesthesia shows spinal stenosis and degenerative changes, seen on prior imaging 8/11 - New R ankle pain, negative xray  Assessment and Plan: Maria [name] is a 67 y.o. female with a pertinent PMH of HTN, T2DM, HFrEF, and chronic pain on opiates who presented with increasing LE weakness and radicular symptoms following a fall and was admitted for MRI under general anesthesia in the setting of intractable pain and photophobia preventing normal MRI, currently receiving pain medication and awaiting placement at SNF.   [facility]     * (Principal) Fall     Mechanical fall at home prior to admission. - MRI with thoracic and lumbar disc bulging and degenerative changes - Avoid sedating medications - PT & OT, recommending SNF or CIR        Chronic low back pain     Patient has a history of chronic back pain.  Home regimen is oxycodone  10 mg q4h, gabapentin 300 mg at bedtime.  45/120 MME used 8/10. - Oxycodone 10 mg Q4h PRN for total 90 MME - Gabapentin 400 mg at bedtime - PRN tylenol 650 mg         Hypokalemia     Potassium 3.3, repleted with 60 mEq KCl PO - Daily AM BMP, replete as necessary        (HFpEF) heart failure with preserved ejection fraction (HCC)     Chronic lymphedema, continuing diuresis.  1.8 L UOP 8/10.  Last Wt  144.7 kg 8/9. - Spironolactone 12.5 mg daily - Torsemide 40 mg daily - Metoprolol succinate 25 mg daily - Strict I/Os        Ankle pain, right     New onset R ankle pain noted 8/10-8/11 overnight.  No swelling, pain  worst at calcaneus.  Xray 2 view negative for fracture or dislocation. - Baclofen 5 mg x1 overnight,  consider again if needed - Aquathermia - Ankle brace assistive device        Hypomagnesemia     Mg 1.9, ordered 2g IV -daily BMP, replete as needed      FEN/GI: carb modified PPx: lovenox Dispo: SNF today pending placement  Subjective:  Patient resting comfortably in bed this morning.  States she is feeling well, becomes tearful when talking about SNF options, states that she does not want to go to the inpatient rehab center at [facility].  States she is not having dysuria.  Still having some mild pain in right foot but feels this is improved from yesterday.  Objective: Temp:  [98.1 F (36.7 C)-99.1 F (37.3 C)] 98.1 F (36.7 C) (08/13 0743) Pulse Rate:  [64-77] 64 (08/13 0743) Resp:  [17] 17 (08/13 0743) BP: (106-125)/(43-50) 120/50 (08/13 0743) SpO2:  [93 %-95 %] 95 % (08/13 0743) Physical Exam: General: Resting comfortably in bed.  No acute distress. Cardiovascular: Regular rate and rhythm.  Normal S1-S2.  No murmurs rubs or gallops. Respiratory: Lungs clear to auscultation bilaterally.  No wheezes rales or rhonchi. Abdomen: Bowel sounds present.  Soft nontender nondistended. Extremities: Erythema, edema, scaling, and mild tenderness to palpation at bilateral mid shins to ankles.  Consistent with prior  exams.  See photos in physical exam section from 8/12  Laboratory: Most recent CBC Lab Results  Component Value Date   WBC 10.8 (H) 08/02/2023   HGB 11.6 (L) 08/02/2023   HCT 35.8 (L) 08/02/2023   MCV 90.6 08/02/2023   PLT 219 08/02/2023   Most recent BMP    Latest Ref Rng & Units 08/03/2023    2:24 AM  BMP  Glucose 70 - 99 mg/dL 409   BUN 8 - 23 mg/dL 19   Creatinine 8.11 - 1.00 mg/dL 9.14   Sodium 782 - 956 mmol/L 136   Potassium 3.5 - 5.1 mmol/L 3.3   Chloride 98 - 111 mmol/L 96   CO2 22 - 32 mmol/L 32   Calcium 8.9 - 10.3 mg/dL 8.0    Other labs: UA with leukocytes, rare bacteria, negative nitrites  Lorayne Bender, MD 08/03/2023, 1:21 PM  PGY-1, [place]  Family Medicine FPTS Intern pager: [phone], text pages welcome Secure chat group [facility] Teaching Service

## 2023-08-03 NOTE — Discharge Summary (Addendum)
Family Medicine Teaching [facility] Discharge Summary  Patient name: Maria [name] Medical record number: [id number] Date of birth: 02-12-1956 Age: 67 y.o. Gender: female Date of Admission: 07/28/2023  Date of Discharge: 08/03/23 Admitting Physician: Gery Pray, MD  Primary Care Provider: Ignatius Specking, NP Consultants: none  Indication for Hospitalization: mechanical fall  Brief Hospital Course:  Maria [name] is a 67 y.o.female with a history of CHF, chronic low back pain, hypertension, lymphedema who was admitted to the family med teaching Service at [facility] for for a mechanical fall and intractable pain. Her hospital course is detailed below:  Fall/Pain Presented to the ED with lower extremity weakness and radicular symptoms after a mechanical fall at home. x-rays were negative for fracture, and showed moderate degenerative knee changes bilaterally. She required an MRI under anesthesia due to her claustrophobia. MRI showed Mild degenerative changes of the thoracic spine with small disc protrusions at T5-6 and T8-9 and mild bilateral neural foraminal narrowing at T9-10 and T10-11. No high-grade spinal canal or neural foraminal stenosis at any level.  Patient's pain was controlled on IV Dilaudid sparingly. She was mantained on her home oxycodone 10 mg q4h, and increased gabapentin to 400mg  nightly .  PT and OT evaluated throughout admission and recommended SNF after discharge from the hospital.  Patient was discharged on gabapentin 400 mg and  oxycodone 10 mg q 4 hours.   Other chronic conditions were medically managed with home medications and formulary alternatives as necessary (CHF, HTN, anxiety,depression, chronic pain, )  PCP Follow-up Recommendations: Follow-up with PCP regarding pain regimen for chronic back pain.     Discharge Diagnoses/Problem List:  Mechanical fall Chronic low back pain HFpEF Hypokalemia Hypomagnesemia R ankle pain Dysuria-  resolved  Disposition: SNF  Discharge Condition: stable  Discharge Exam:  General: laying in bed in no acute distress Cardiovascular: regular heart rate and rhythm, normal S1/S2, no murmurs, rubs, or gallops Pulmonary: lungs clear to auscultation bilaterally, normal work of breathing Abdomen: soft, mild tenderness, non distended, positive bowel sounds  Significant Procedures: none  Significant Labs and Imaging:  Recent Labs  Lab 08/02/23 1012  WBC 10.8*  HGB 11.6*  HCT 35.8*  PLT 219   Recent Labs  Lab 08/01/23 1233 08/02/23 0029 08/03/23 0224  NA 138 137 136  K 3.4* 3.6 3.3*  CL 95* 94* 96*  CO2 32 31 32  GLUCOSE 144* 136* 112*  BUN 13 17 19   CREATININE 1.35* 1.44* 1.34*  CALCIUM 8.2* 8.2* 8.0*  MG  --  1.9 2.2      Results/Tests Pending at Time of Discharge: none  Discharge Medications:  Allergies as of 08/03/2023       Reactions   Amoxicillin-pot Clavulanate Nausea And Vomiting   Projectile vomiting Did it involve swelling of the face/tongue/throat, SOB, or low BP? No Did it involve sudden or severe rash/hives, skin peeling, or any reaction on the inside of your mouth or nose? No Did you need to seek medical attention at a hospital or doctor's office? No When did it last happen?      20-30 years If all above answers are "NO", may proceed with cephalosporin use.   Adhesive [tape] Other (See Comments)   Tears skin off - use paper tape    Codeine Other (See Comments)   hallucinations   Crestor [rosuvastatin Calcium]    Severe muscle cramps   Lyrica [pregabalin] Other (See Comments)   weakness   Morphine And Codeine Nausea And  Vomiting, Other (See Comments)   Migraine headaches   Vicodin [hydrocodone-acetaminophen] Other (See Comments)   hallucinations   Latex Rash        Medication List     TAKE these medications    Accu-Chek Guide test strip Generic drug: glucose blood Use as instructed up to 4 times daily   Accu-Chek Guide w/Device  Kit Use as directed What changed:  how much to take how to take this when to take this additional instructions   albuterol 108 (90 Base) MCG/ACT inhaler Commonly known as: ProAir HFA Inhale 2 puffs into the lungs every 6 (six) hours as needed for wheezing or shortness of breath.   cholecalciferol 25 MCG (1000 UNIT) tablet Commonly known as: VITAMIN D3 Take 2,000 Units by mouth daily.   citalopram 20 MG tablet Commonly known as: CELEXA Take 1 tablet (20 mg total) by mouth daily.   clonazePAM 1 MG tablet Commonly known as: KLONOPIN TAKE ONE TABLET BY MOUTH TWICE DAILY AS NEEDED FOR ANXIETY What changed: reasons to take this   cyanocobalamin 1000 MCG tablet Commonly known as: VITAMIN B12 Take 1 tablet (1,000 mcg total) by mouth daily.   fluticasone 50 MCG/ACT nasal spray Commonly known as: FLONASE Place 1 spray into both nostrils daily.   gabapentin 400 MG capsule Commonly known as: NEURONTIN Take 1 capsule (400 mg total) by mouth at bedtime. What changed:  medication strength how much to take   Metamucil Fiber Chew Chew 3 tablets by mouth at bedtime.   metoprolol succinate 25 MG 24 hr tablet Commonly known as: TOPROL-XL Take 1 tablet (25 mg total) by mouth daily.   Mounjaro 10 MG/0.5ML Pen Generic drug: tirzepatide Inject 10 mg into the skin once a week.   Narcan 4 MG/0.1ML Liqd nasal spray kit Generic drug: naloxone Place 0.4 mg into the nose daily as needed (opioid overdose).   ondansetron 4 MG disintegrating tablet Commonly known as: Zofran ODT Take 1 tablet (4 mg total) by mouth every 4 (four) hours as needed for nausea or vomiting.   Oxycodone HCl 10 MG Tabs Take 1 tablet (10 mg total) 4 (four) times daily by mouth. Per [facility] What changed:  when to take this reasons to take this   pantoprazole 40 MG tablet Commonly known as: PROTONIX TAKE ONE TABLET BY MOUTH DAILY   potassium chloride SA 20 MEQ tablet Commonly known as: KLOR-CON  M Take 2 tablets (40 mEq total) by mouth 2 (two) times daily.   pramipexole 0.5 MG tablet Commonly known as: MIRAPEX Take 1 tablet (0.5 mg total) by mouth 3 (three) times daily.   spironolactone 25 MG tablet Commonly known as: ALDACTONE Take 0.5 tablets (12.5 mg total) by mouth daily. What changed:  how much to take additional instructions   torsemide 20 MG tablet Commonly known as: DEMADEX Take 2 tablets (40 mg total) by mouth daily.        Discharge Instructions: Please refer to Patient Instructions section of EMR for full details.  Patient was counseled important signs and symptoms that should prompt return to medical care, changes in medications, dietary instructions, activity restrictions, and follow up appointments.   Future Appointments  Date Time Provider Department Center  10/21/2023 11:00 AM Helane Gunther, DPM [facility]    Upper Level Attestation I have seen and examined the patient with the resident Dr. Dolan Amen. I agree with the history, physical, and assessment above with any necessary edits.   Lockie Mola, MD  Family  Medicine Teaching Service

## 2023-08-03 NOTE — TOC Progression Note (Addendum)
Transition of Care [facility]) - Progression Note    Patient Details  Name: Maria [name] MRN: [id number] Date of Birth: [birth year]  [facility] CM/SW Contact  Lorri Frederick, LCSW Phone Number: 08/03/2023, 9:52 AM  Clinical Narrative:   CSW met with pt, provided bed offers.  Pt requesting response from [facility] and [facility].  CSW reached out to those facilities.  0945: [facility] out of network with pt insurance.  [facility] bed.  Pt informed, wants to accept offer at [facility].  Auth request submitted in [place] and approved: [id number], 3 days: 8/13-8/15.    1100: [facility].  She had questions about need for bari bed for this pt.  They do not have one available and usually use one above 300LBS.  With pt permission, face time call completed with Everardo [facility], who spoke to pt, discussed the situation and reports that pt should do fine on a regular bed at [facility].  Pt in agreement and does want to move forward.    MD notified.     Expected Discharge Plan: Skilled Nursing Facility Barriers to Discharge: Continued Medical Work up, SNF Pending bed offer  Expected Discharge Plan and Services In-house Referral: Clinical Social Work   Post Acute Care Choice: Skilled Nursing Facility Living arrangements for the past 2 months: Single Family Home                                       Social Determinants of Health (SDOH) Interventions SDOH Screenings   Food Insecurity: No Food Insecurity (07/29/2023)  Housing: Low Risk  (07/29/2023)  Transportation Needs: No Transportation Needs (07/29/2023)  Utilities: Not At Risk (07/29/2023)  Alcohol Screen: Low Risk  (12/30/2021)  Depression (PHQ2-9): Medium Risk (01/30/2022)  Financial Resource Strain: Medium Risk (12/30/2021)  Physical Activity: Inactive (12/30/2021)  Social Connections: Unknown (09/17/2022)   Received from [facility], [facility]  Stress: No Stress Concern Present (12/30/2021)  Tobacco Use:  Medium Risk (07/30/2023)    Readmission Risk Interventions    07/22/2022    2:59 PM 04/11/2021   11:40 AM  Readmission Risk Prevention Plan  Transportation Screening Complete Complete  PCP or Specialist Appt within 3-5 Days Complete Complete  HRI or Home Care Consult Complete Complete  Social Work Consult for Recovery Care Planning/Counseling Complete Complete  Palliative Care Screening Complete Not Applicable  Medication Review Oceanographer) Complete Complete

## 2023-08-03 NOTE — Assessment & Plan Note (Signed)
Mechanical fall at home prior to admission. - MRI with thoracic and lumbar disc bulging and degenerative changes - Avoid sedating medications - PT & OT, recommending SNF or CIR

## 2023-08-03 NOTE — Assessment & Plan Note (Signed)
Potassium 3.3, repleted with 60 mEq KCl PO - Daily AM BMP, replete as necessary

## 2023-08-03 NOTE — Plan of Care (Signed)

## 2023-08-03 NOTE — TOC Transition Note (Signed)
Transition of Care [facility]) - CM/SW Discharge Note   Patient Details  Name: Maria [name] MRN: [id number] Date of Birth: 06/08/1956  Transition of [facility]) CM/SW Contact:  Lorri Frederick, LCSW Phone Number: 08/03/2023, 1:15 PM   Clinical Narrative:   Pt discharging to [facility].  RN call report to [phone].    Final next level of care: Skilled Nursing Facility Barriers to Discharge: Barriers Resolved   Patient Goals and CMS Choice CMS Medicare.gov Compare Post Acute Care list provided to:: Patient Choice offered to / list presented to : Patient  Discharge Placement                Patient chooses bed at: [facility] Patient to be transferred to facility by: [facility] Name of family member notified: daughter Patsy Lager Patient and family notified of of transfer: 08/03/23  Discharge Plan and Services Additional resources added to the After Visit Summary for   In-house Referral: Clinical Social Work   Post Acute Care Choice: Skilled Nursing Facility                               Social Determinants of Health (SDOH) Interventions SDOH Screenings   Food Insecurity: No Food Insecurity (07/29/2023)  Housing: Low Risk  (07/29/2023)  Transportation Needs: No Transportation Needs (07/29/2023)  Utilities: Not At Risk (07/29/2023)  Alcohol Screen: Low Risk  (12/30/2021)  Depression (PHQ2-9): Medium Risk (01/30/2022)  Financial Resource Strain: Medium Risk (12/30/2021)  Physical Activity: Inactive (12/30/2021)  Social Connections: Unknown (09/17/2022)   [facility] from [facility], [facility]  Stress: No Stress Concern Present (12/30/2021)  Tobacco Use: Medium Risk (07/30/2023)     Readmission Risk Interventions    07/22/2022    2:59 PM 04/11/2021   11:40 AM  Readmission Risk Prevention Plan  Transportation Screening Complete Complete  PCP or Specialist Appt within 3-5 Days Complete Complete  HRI or Home Care Consult Complete Complete  Social Work Consult for  Recovery Care Planning/Counseling Complete Complete  Palliative Care Screening Complete Not Applicable  Medication Review Oceanographer) Complete Complete

## 2023-08-30 ENCOUNTER — Emergency Department (HOSPITAL_COMMUNITY): Payer: 59

## 2023-08-30 ENCOUNTER — Inpatient Hospital Stay (HOSPITAL_COMMUNITY)
Admission: EM | Admit: 2023-08-30 | Discharge: 2023-09-06 | DRG: 300 | Disposition: A | Discharge status: Home Health | Payer: 59 | Attending: Internal Medicine | Admitting: Internal Medicine

## 2023-08-30 ENCOUNTER — Encounter (HOSPITAL_COMMUNITY): Payer: Self-pay

## 2023-08-30 DIAGNOSIS — R296 Repeated falls: Secondary | ICD-10-CM | POA: Diagnosis present

## 2023-08-30 DIAGNOSIS — G4733 Obstructive sleep apnea (adult) (pediatric): Secondary | ICD-10-CM | POA: Diagnosis present

## 2023-08-30 DIAGNOSIS — B9629 Other Escherichia coli [E. coli] as the cause of diseases classified elsewhere: Secondary | ICD-10-CM | POA: Diagnosis not present

## 2023-08-30 DIAGNOSIS — I130 Hypertensive heart and chronic kidney disease with heart failure and stage 1 through stage 4 chronic kidney disease, or unspecified chronic kidney disease: Secondary | ICD-10-CM | POA: Diagnosis present

## 2023-08-30 DIAGNOSIS — R443 Hallucinations, unspecified: Secondary | ICD-10-CM

## 2023-08-30 DIAGNOSIS — Z825 Family history of asthma and other chronic lower respiratory diseases: Secondary | ICD-10-CM

## 2023-08-30 DIAGNOSIS — G8929 Other chronic pain: Secondary | ICD-10-CM | POA: Diagnosis not present

## 2023-08-30 DIAGNOSIS — N3000 Acute cystitis without hematuria: Secondary | ICD-10-CM

## 2023-08-30 DIAGNOSIS — N390 Urinary tract infection, site not specified: Principal | ICD-10-CM | POA: Diagnosis present

## 2023-08-30 DIAGNOSIS — Z885 Allergy status to narcotic agent status: Secondary | ICD-10-CM | POA: Diagnosis not present

## 2023-08-30 DIAGNOSIS — Z7985 Long-term (current) use of injectable non-insulin antidiabetic drugs: Secondary | ICD-10-CM

## 2023-08-30 DIAGNOSIS — Z6841 Body Mass Index (BMI) 40.0 and over, adult: Secondary | ICD-10-CM

## 2023-08-30 DIAGNOSIS — Z888 Allergy status to other drugs, medicaments and biological substances status: Secondary | ICD-10-CM | POA: Diagnosis not present

## 2023-08-30 DIAGNOSIS — Z8249 Family history of ischemic heart disease and other diseases of the circulatory system: Secondary | ICD-10-CM

## 2023-08-30 DIAGNOSIS — Z87891 Personal history of nicotine dependence: Secondary | ICD-10-CM | POA: Diagnosis not present

## 2023-08-30 DIAGNOSIS — I872 Venous insufficiency (chronic) (peripheral): Secondary | ICD-10-CM | POA: Diagnosis not present

## 2023-08-30 DIAGNOSIS — I5030 Unspecified diastolic (congestive) heart failure: Secondary | ICD-10-CM | POA: Diagnosis present

## 2023-08-30 DIAGNOSIS — K5900 Constipation, unspecified: Secondary | ICD-10-CM | POA: Diagnosis present

## 2023-08-30 DIAGNOSIS — I10 Essential (primary) hypertension: Secondary | ICD-10-CM | POA: Diagnosis not present

## 2023-08-30 DIAGNOSIS — E785 Hyperlipidemia, unspecified: Secondary | ICD-10-CM | POA: Diagnosis present

## 2023-08-30 DIAGNOSIS — Z833 Family history of diabetes mellitus: Secondary | ICD-10-CM

## 2023-08-30 DIAGNOSIS — I5032 Chronic diastolic (congestive) heart failure: Secondary | ICD-10-CM | POA: Diagnosis present

## 2023-08-30 DIAGNOSIS — Z79899 Other long term (current) drug therapy: Secondary | ICD-10-CM

## 2023-08-30 DIAGNOSIS — N1831 Chronic kidney disease, stage 3a: Secondary | ICD-10-CM | POA: Diagnosis present

## 2023-08-30 DIAGNOSIS — Z8049 Family history of malignant neoplasm of other genital organs: Secondary | ICD-10-CM

## 2023-08-30 DIAGNOSIS — E1122 Type 2 diabetes mellitus with diabetic chronic kidney disease: Secondary | ICD-10-CM | POA: Diagnosis present

## 2023-08-30 DIAGNOSIS — Z818 Family history of other mental and behavioral disorders: Secondary | ICD-10-CM

## 2023-08-30 DIAGNOSIS — R531 Weakness: Secondary | ICD-10-CM | POA: Diagnosis present

## 2023-08-30 DIAGNOSIS — Z79891 Long term (current) use of opiate analgesic: Secondary | ICD-10-CM

## 2023-08-30 DIAGNOSIS — R441 Visual hallucinations: Secondary | ICD-10-CM | POA: Diagnosis not present

## 2023-08-30 DIAGNOSIS — E1151 Type 2 diabetes mellitus with diabetic peripheral angiopathy without gangrene: Secondary | ICD-10-CM | POA: Diagnosis present

## 2023-08-30 DIAGNOSIS — Z800 Family history of malignant neoplasm of digestive organs: Secondary | ICD-10-CM

## 2023-08-30 DIAGNOSIS — G894 Chronic pain syndrome: Secondary | ICD-10-CM | POA: Diagnosis present

## 2023-08-30 DIAGNOSIS — F419 Anxiety disorder, unspecified: Secondary | ICD-10-CM | POA: Diagnosis present

## 2023-08-30 DIAGNOSIS — N183 Chronic kidney disease, stage 3 unspecified: Secondary | ICD-10-CM | POA: Diagnosis present

## 2023-08-30 DIAGNOSIS — Z880 Allergy status to penicillin: Secondary | ICD-10-CM | POA: Diagnosis not present

## 2023-08-30 DIAGNOSIS — R5381 Other malaise: Secondary | ICD-10-CM | POA: Insufficient documentation

## 2023-08-30 DIAGNOSIS — R8271 Bacteriuria: Secondary | ICD-10-CM

## 2023-08-30 DIAGNOSIS — Z91040 Latex allergy status: Secondary | ICD-10-CM | POA: Diagnosis not present

## 2023-08-30 DIAGNOSIS — Z91048 Other nonmedicinal substance allergy status: Secondary | ICD-10-CM

## 2023-08-30 DIAGNOSIS — K219 Gastro-esophageal reflux disease without esophagitis: Secondary | ICD-10-CM | POA: Diagnosis present

## 2023-08-30 DIAGNOSIS — F32A Depression, unspecified: Secondary | ICD-10-CM | POA: Diagnosis present

## 2023-08-30 DIAGNOSIS — Z87440 Personal history of urinary (tract) infections: Secondary | ICD-10-CM

## 2023-08-30 DIAGNOSIS — Z1612 Extended spectrum beta lactamase (ESBL) resistance: Secondary | ICD-10-CM | POA: Diagnosis not present

## 2023-08-30 LAB — URINALYSIS, W/ REFLEX TO CULTURE (INFECTION SUSPECTED)
Bilirubin Urine: NEGATIVE
Glucose, UA: NEGATIVE mg/dL
Ketones, ur: NEGATIVE mg/dL
Nitrite: POSITIVE — AB
Protein, ur: NEGATIVE mg/dL
Specific Gravity, Urine: 1.008 (ref 1.005–1.030)
pH: 6 (ref 5.0–8.0)

## 2023-08-30 LAB — CBC WITH DIFFERENTIAL/PLATELET
Abs Immature Granulocytes: 0.03 10*3/uL (ref 0.00–0.07)
Basophils Absolute: 0.1 10*3/uL (ref 0.0–0.1)
Basophils Relative: 1 %
Eosinophils Absolute: 0.2 10*3/uL (ref 0.0–0.5)
Eosinophils Relative: 2 %
HCT: 36.2 % (ref 36.0–46.0)
Hemoglobin: 11.4 g/dL — ABNORMAL LOW (ref 12.0–15.0)
Immature Granulocytes: 0 %
Lymphocytes Relative: 26 %
Lymphs Abs: 2.8 10*3/uL (ref 0.7–4.0)
MCH: 29.7 pg (ref 26.0–34.0)
MCHC: 31.5 g/dL (ref 30.0–36.0)
MCV: 94.3 fL (ref 80.0–100.0)
Monocytes Absolute: 0.7 10*3/uL (ref 0.1–1.0)
Monocytes Relative: 7 %
Neutro Abs: 7.1 10*3/uL (ref 1.7–7.7)
Neutrophils Relative %: 64 %
Platelets: 215 10*3/uL (ref 150–400)
RBC: 3.84 MIL/uL — ABNORMAL LOW (ref 3.87–5.11)
RDW: 14 % (ref 11.5–15.5)
WBC: 10.8 10*3/uL — ABNORMAL HIGH (ref 4.0–10.5)
nRBC: 0 % (ref 0.0–0.2)

## 2023-08-30 LAB — COMPREHENSIVE METABOLIC PANEL
ALT: 22 U/L (ref 0–44)
AST: 23 U/L (ref 15–41)
Albumin: 3.6 g/dL (ref 3.5–5.0)
Alkaline Phosphatase: 80 U/L (ref 38–126)
Anion gap: 9 (ref 5–15)
BUN: 15 mg/dL (ref 8–23)
CO2: 29 mmol/L (ref 22–32)
Calcium: 9.1 mg/dL (ref 8.9–10.3)
Chloride: 100 mmol/L (ref 98–111)
Creatinine, Ser: 1.13 mg/dL — ABNORMAL HIGH (ref 0.44–1.00)
GFR, Estimated: 54 mL/min — ABNORMAL LOW (ref >60)
Glucose, Bld: 99 mg/dL (ref 70–99)
Potassium: 5 mmol/L (ref 3.5–5.1)
Sodium: 138 mmol/L (ref 135–145)
Total Bilirubin: 1.1 mg/dL (ref 0.3–1.2)
Total Protein: 6.9 g/dL (ref 6.5–8.1)

## 2023-08-30 LAB — BRAIN NATRIURETIC PEPTIDE: B Natriuretic Peptide: 17.1 pg/mL (ref 0.0–100.0)

## 2023-08-30 LAB — TROPONIN I (HIGH SENSITIVITY): Troponin I (High Sensitivity): 7 ng/L (ref <18)

## 2023-08-30 MED ORDER — TORSEMIDE 20 MG PO TABS
40.0000 mg | ORAL_TABLET | Freq: Every day | ORAL | Status: DC
  Administered 2023-08-31 – 2023-09-06 (×7): 40 mg via ORAL
  Filled 2023-08-30 (×7): qty 2

## 2023-08-30 MED ORDER — VITAMIN B-12 1000 MCG PO TABS: 1000.0000 ug | ORAL_TABLET | Freq: Every day | ORAL | Status: DC

## 2023-08-30 MED ORDER — PANTOPRAZOLE SODIUM 40 MG PO TBEC
40.0000 mg | DELAYED_RELEASE_TABLET | Freq: Every day | ORAL | Status: DC
  Administered 2023-08-31 – 2023-09-06 (×7): 40 mg via ORAL
  Filled 2023-08-30 (×7): qty 1

## 2023-08-30 MED ORDER — OXYCODONE HCL 10 MG PO TABS: 10.0000 mg | ORAL_TABLET | ORAL | Status: DC | PRN

## 2023-08-30 MED ORDER — METOPROLOL SUCCINATE ER 25 MG PO TB24
25.0000 mg | ORAL_TABLET | Freq: Every day | ORAL | Status: DC
  Administered 2023-08-31 – 2023-09-06 (×7): 25 mg via ORAL
  Filled 2023-08-30 (×7): qty 1

## 2023-08-30 MED ORDER — SODIUM CHLORIDE 0.9 % IV SOLN
1.0000 g | Freq: Once | INTRAVENOUS | Status: AC
  Administered 2023-08-30: 1 g via INTRAVENOUS
  Filled 2023-08-30: qty 20

## 2023-08-30 MED ORDER — SODIUM CHLORIDE 0.9 % IV SOLN
1.0000 g | Freq: Three times a day (TID) | INTRAVENOUS | Status: DC
  Administered 2023-08-31 (×2): 1 g via INTRAVENOUS
  Filled 2023-08-30 (×2): qty 20

## 2023-08-30 MED ORDER — ENOXAPARIN SODIUM 80 MG/0.8ML IJ SOSY
70.0000 mg | PREFILLED_SYRINGE | INTRAMUSCULAR | Status: DC
  Administered 2023-08-30 – 2023-09-05 (×7): 70 mg via SUBCUTANEOUS
  Filled 2023-08-30 (×3): qty 0.8
  Filled 2023-08-30: qty 0.7
  Filled 2023-08-30 (×4): qty 0.8

## 2023-08-30 MED ORDER — PRAMIPEXOLE DIHYDROCHLORIDE 0.25 MG PO TABS
0.5000 mg | ORAL_TABLET | Freq: Three times a day (TID) | ORAL | Status: DC
  Administered 2023-08-30 – 2023-09-06 (×21): 0.5 mg via ORAL
  Filled 2023-08-30 (×22): qty 2

## 2023-08-30 MED ORDER — PSYLLIUM 95 % PO PACK
1.0000 | PACK | Freq: Every day | ORAL | Status: DC
  Administered 2023-09-02: 1 via ORAL
  Filled 2023-08-30 (×7): qty 1

## 2023-08-30 MED ORDER — HYDROMORPHONE HCL 1 MG/ML IJ SOLN
1.0000 mg | Freq: Once | INTRAMUSCULAR | Status: AC
  Administered 2023-08-30: 1 mg via INTRAVENOUS
  Filled 2023-08-30: qty 1

## 2023-08-30 MED ORDER — OXYCODONE HCL 5 MG PO TABS
10.0000 mg | ORAL_TABLET | ORAL | Status: DC | PRN
  Administered 2023-08-30 – 2023-09-06 (×11): 10 mg via ORAL
  Filled 2023-08-30 (×11): qty 2

## 2023-08-30 MED ORDER — SPIRONOLACTONE 25 MG PO TABS
25.0000 mg | ORAL_TABLET | Freq: Every day | ORAL | Status: DC
  Administered 2023-08-31 – 2023-09-06 (×7): 25 mg via ORAL
  Filled 2023-08-30 (×8): qty 1

## 2023-08-30 MED ORDER — POTASSIUM CHLORIDE CRYS ER 20 MEQ PO TBCR
40.0000 meq | EXTENDED_RELEASE_TABLET | Freq: Two times a day (BID) | ORAL | Status: DC
  Administered 2023-08-31 – 2023-09-06 (×13): 40 meq via ORAL
  Filled 2023-08-30 (×14): qty 2

## 2023-08-30 MED ORDER — DULOXETINE HCL 20 MG PO CPEP
20.0000 mg | ORAL_CAPSULE | Freq: Every day | ORAL | Status: DC
  Administered 2023-08-31 – 2023-09-06 (×7): 20 mg via ORAL
  Filled 2023-08-30 (×7): qty 1

## 2023-08-30 MED ORDER — VITAMIN D 25 MCG (1000 UNIT) PO TABS
2000.0000 [IU] | ORAL_TABLET | Freq: Every day | ORAL | Status: DC
  Administered 2023-08-31 – 2023-09-06 (×7): 2000 [IU] via ORAL
  Filled 2023-08-30 (×7): qty 2

## 2023-08-30 MED ORDER — VITAMIN B-12 1000 MCG PO TABS
1000.0000 ug | ORAL_TABLET | Freq: Every day | ORAL | Status: DC
  Administered 2023-08-31 – 2023-09-06 (×7): 1000 ug via ORAL
  Filled 2023-08-30 (×7): qty 1

## 2023-08-30 MED ORDER — PRAMIPEXOLE DIHYDROCHLORIDE 0.25 MG PO TABS: 0.5000 mg | ORAL_TABLET | Freq: Three times a day (TID) | ORAL | Status: DC

## 2023-08-30 MED ORDER — FLUTICASONE PROPIONATE 50 MCG/ACT NA SUSP
1.0000 | Freq: Every day | NASAL | Status: DC
  Administered 2023-08-31 – 2023-09-06 (×7): 1 via NASAL
  Filled 2023-08-30: qty 16

## 2023-08-30 MED ORDER — METAMUCIL FIBER PO CHEW: 3.0000 | CHEWABLE_TABLET | Freq: Every day | ORAL | Status: DC

## 2023-08-30 MED ORDER — MORPHINE SULFATE (PF) 2 MG/ML IV SOLN
2.0000 mg | Freq: Once | INTRAVENOUS | Status: AC
  Administered 2023-08-30: 2 mg via INTRAVENOUS
  Filled 2023-08-30: qty 1

## 2023-08-30 MED ORDER — CLONAZEPAM 1 MG PO TABS
1.0000 mg | ORAL_TABLET | Freq: Two times a day (BID) | ORAL | Status: DC | PRN
  Administered 2023-08-30 – 2023-09-01 (×3): 1 mg via ORAL
  Filled 2023-08-30: qty 2
  Filled 2023-08-30 (×2): qty 1

## 2023-08-30 NOTE — Assessment & Plan Note (Signed)
-  appears compensated

## 2023-08-30 NOTE — ED Triage Notes (Signed)
BIB [facility] from home c/o leg pain. DC from rehab last week. Chronic leg pain In route to pain management today to place pain pump but unable to continue ambulating. Lowered herself to the ground and called EMS. Uses walker for ambulation. Denies LOC, head injuries or blood thinners

## 2023-08-30 NOTE — Assessment & Plan Note (Addendum)
-   Unfortunately, suspect her weakness is more so due to multiple variables including her morbid obesity and low mobility state.  Recently discharged home from [facility] and suspect she has been more immobile without ongoing PT. She also continues to remain on multiple sedating medications including clonazepam, gabapentin, oxycodone which are certainly contributing to her weakness and falls - will again have her evaluated by PT/OT. I think at this rate, she may need to consider LTC as well; SNF unable to be arranged per family financial constraints and therefore she will be discharged home with home health

## 2023-08-30 NOTE — Assessment & Plan Note (Signed)
-  controlled -continue metoprolol, spironolactone and torsemide

## 2023-08-30 NOTE — H&P (Signed)
History and Physical    Patient: Maria [name] EXB:[id number] DOB: 08/09/1956 DOA: 08/30/2023 DOS: the patient was seen and examined on 08/30/2023 PCP: Ignatius Specking, NP  Patient coming from: Home  Chief Complaint:  Chief Complaint  Patient presents with   Leg Pain   HPI: Maria [name] is a 67 y.o. female with medical history significant of HTN, stasis dermatitis of bilateral LE, chronic diastolic HF, OSA, CKD 3a, sclerosing mesenteritis, T2DM, GERD, anxiety depression, morbid obesity, chronic pain syndrome, ESBL UTI who presents with weakness and leg pain.   Patient reports that she has been weak for 8 weeks. She was recently hospitalized at [facility] from 8/7-8/13 with mechanical fall due to LE weakness. She was ultimately discharged to rehab. However felt she was still weak upon returning home. Ambulates with rollator at baseline and today was so weak she could not stand. She has transportation assistance come today to take her to an appointment but even he could not help her stand. She also reports increase LE pain worse on the left. She has chronic stasis dermatitis but feels legs are more red. Denies any fever or chills. Has felt nausea and constipation.  Had noted increase urinary frequency and urgency.   In the ED, she was afebrile, normotensive on room air.   CBC with mild leukocytosis of 10.8K, hgb of 11.4.   CMP unremarkable.   UA with small leukocyte, positive nitrate and many bacteria.   Pt was started on meropenem due to hx of ESBL UTI with sensitive only to imipenem and gentamicin.  Review of Systems: As mentioned in the history of present illness. All other systems reviewed and are negative. Past Medical History:  Diagnosis Date   Acute lymphadenitis 2011   ALLERGIC RHINITIS 08/10/2007   ANXIETY 12/06/2007   Atherosclerotic peripheral vascular disease (HCC) 06/13/2013   Aorta on CT June 2014   Cellulitis 2011   Cervical disc disease 03/09/2012   CHF (congestive  heart failure) (HCC)    Chronic pain 03/09/2012   DEPRESSION 12/06/2007   Diabetes (HCC)    DIVERTICULOSIS, Sissel 12/06/2007   GERD 12/06/2007   Hepatitis    age 2 hepatitis A   HLD (hyperlipidemia) 05/24/2019   HYPERTENSION 12/06/2007   Lumbar disc disease 03/09/2012   Mesenteric adenitis    MRSA 2006   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 2010   CERVICAL DISC SURGERY  may 24th 2010   COLONOSCOPY WITH PROPOFOL N/A 07/10/2016   Procedure: COLONOSCOPY WITH PROPOFOL;  Surgeon: Ruffin Frederick, MD;  Location: [facility];  Service: Gastroenterology;  Laterality: N/A;   COLONOSCOPY WITH PROPOFOL N/A 09/26/2019   Procedure: COLONOSCOPY WITH PROPOFOL;  Surgeon: Benancio Deeds, MD;  Location: [facility];  Service: Gastroenterology;  Laterality: N/A;   ESOPHAGOGASTRODUODENOSCOPY (EGD) WITH PROPOFOL N/A 07/10/2016   Procedure: ESOPHAGOGASTRODUODENOSCOPY (EGD) WITH PROPOFOL;  Surgeon: Ruffin Frederick, MD;  Location: [facility];  Service: Gastroenterology;  Laterality: N/A;   POLYPECTOMY  09/26/2019   Procedure: POLYPECTOMY;  Surgeon: Benancio Deeds, MD;  Location: [facility];  Service: Gastroenterology;;   RADIOLOGY WITH ANESTHESIA N/A 07/30/2023   Procedure: MRI WITH ANESTHESIA - THORACIC LUMBAR WITHOUT CONSTRAST;  Surgeon: Radiologist, Medication, MD;  Location: [facility] OR;  Service: Radiology;  Laterality: N/A;  s/p ovary cyst     s/p right knee arthroscopy     Dr. Dion Saucier ortho   Social History:  reports that she quit smoking about 14 years ago. Her smoking use included cigarettes. She started smoking about 44 years ago. She has never used smokeless tobacco. She reports that she does not drink alcohol and does not use drugs.  Allergies  Allergen Reactions    Amoxicillin-Pot Clavulanate Nausea And Vomiting    Projectile vomiting Did it involve swelling of the face/tongue/throat, SOB, or low BP? No Did it involve sudden or severe rash/hives, skin peeling, or any reaction on the inside of your mouth or nose? No Did you need to seek medical attention at a hospital or doctor's office? No When did it last happen?      20-30 years If all above answers are "NO", may proceed with cephalosporin use.    Adhesive [Tape] Other (See Comments)    Tears skin off - use paper tape    Codeine Other (See Comments)    hallucinations   Crestor [Rosuvastatin Calcium]     Severe muscle cramps   Lyrica [Pregabalin] Other (See Comments)    weakness   Morphine And Codeine Nausea And Vomiting and Other (See Comments)    Migraine headaches   Vicodin [Hydrocodone-Acetaminophen] Other (See Comments)    hallucinations   Latex Rash    Family History  Problem Relation Age of Onset   Heart disease Mother    Hypertension Mother    Diabetes Mother    Heart failure Mother    Asthma Sister    Anxiety disorder Sister    Depression Sister    Hypertension Father    Asthma Daughter    Bipolar disorder Daughter    Cancer Maternal Uncle        Proud   Cancer Other        ovarian   Liver cancer Paternal Grandmother        ????    Prior to Admission medications   Medication Sig Start Date End Date Taking? Authorizing Provider  albuterol (PROAIR HFA) 108 (90 Base) MCG/ACT inhaler Inhale 2 puffs into the lungs every 6 (six) hours as needed for wheezing or shortness of breath. 11/05/20   Terressa Koyanagi, DO  Blood Glucose Monitoring Suppl (ACCU-CHEK GUIDE) w/Device KIT Use as directed Patient taking differently: 1 each by Other route as directed. 04/11/21   Zigmund Daniel., MD  cholecalciferol (VITAMIN D3) 25 MCG (1000 UNIT) tablet Take 2,000 Units by mouth daily.    [provider]  citalopram (CELEXA) 20 MG tablet Take 1 tablet (20 mg total) by mouth  daily. 11/02/22   Corwin Levins, MD  clonazePAM (KLONOPIN) 1 MG tablet TAKE ONE TABLET BY MOUTH TWICE DAILY AS NEEDED FOR ANXIETY Patient taking differently: Take 1 mg by mouth 2 (two) times daily as needed for anxiety. for anxiety 06/08/23   Corwin Levins, MD  cyanocobalamin 1000 MCG tablet Take 1 tablet (1,000 mcg total) by mouth daily. 07/18/23   Alicia Amel, MD  fluticasone (FLONASE) 50 MCG/ACT nasal spray Place 1 spray into both nostrils daily. 12/07/22   [provider]  gabapentin (NEURONTIN) 400 MG capsule Take 1 capsule (400 mg total) by mouth at bedtime. 08/03/23   Lockie Mola, MD  glucose blood (ACCU-CHEK GUIDE) test strip Use as instructed up to 4 times daily 04/11/21   Zigmund Daniel., MD  Metamucil Fiber CHEW Chew 3 tablets  by mouth at bedtime.    [provider]  metoprolol succinate (TOPROL-XL) 25 MG 24 hr tablet Take 1 tablet (25 mg total) by mouth daily. 07/18/23   Alicia Amel, MD  MOUNJARO 10 MG/0.5ML Pen Inject 10 mg into the skin once a week. Patient not taking: Reported on 07/29/2023 06/29/23   [provider]  NARCAN 4 MG/0.1ML LIQD nasal spray kit Place 0.4 mg into the nose daily as needed (opioid overdose). 04/25/19   [provider]  ondansetron (ZOFRAN ODT) 4 MG disintegrating tablet Take 1 tablet (4 mg total) by mouth every 4 (four) hours as needed for nausea or vomiting. 08/23/19   Arby Barrette, MD  Oxycodone HCl 10 MG TABS Take 1 tablet (10 mg total) 4 (four) times daily by mouth. Per [facility] Patient taking differently: Take 10 mg by mouth every 4 (four) hours as needed (pain). Per [facility] 11/08/17   Corwin Levins, MD  pantoprazole (PROTONIX) 40 MG tablet TAKE ONE TABLET BY MOUTH DAILY Patient taking differently: Take 40 mg by mouth daily. 01/29/22   Corwin Levins, MD  potassium chloride SA (KLOR-CON M) 20 MEQ tablet Take 2 tablets (40 mEq total) by mouth 2 (two) times daily. 07/17/23   Cyndia Skeeters,  DO  pramipexole (MIRAPEX) 0.5 MG tablet Take 1 tablet (0.5 mg total) by mouth 3 (three) times daily. 06/24/22   Corwin Levins, MD  spironolactone (ALDACTONE) 25 MG tablet Take 0.5 tablets (12.5 mg total) by mouth daily. Patient taking differently: Take 25 mg by mouth daily. Patient takes 1 25 mg tablet per day. 08/14/22   Sharlene Dory, NP  torsemide (DEMADEX) 20 MG tablet Take 2 tablets (40 mg total) by mouth daily. 07/17/23   Alicia Amel, MD  DULoxetine (CYMBALTA) 60 MG capsule Take 60 mg by mouth daily.    03/09/12  [provider]    Physical Exam: Vitals:   08/30/23 1515 08/30/23 1600 08/30/23 1815 08/30/23 1950  BP: (!) 126/48  (!) 124/53   Pulse: 85  88   Resp: 14  15   Temp:  97.6 F (36.4 C)  97.9 F (36.6 C)  TempSrc:    Oral  SpO2: 98%  97%    Constitutional: NAD, calm, comfortable, non-toxic appearing morbidly obese female Eyes: lids and conjunctivae normal ENMT: Mucous membranes are moist.  Respiratory: clear to auscultation bilaterally, no wheezing, no crackles. Normal respiratory effort. No accessory muscle use.  Cardiovascular: Regular rate and rhythm, no murmurs / rubs / gallops. +3 pitting edema up to knees bilaterally.  Abdomen: diffusely tender, soft, non-distended  Musculoskeletal: no clubbing / cyanosis. No joint deformity upper and lower extremities. Good ROM, no contractures. Normal muscle tone.  Skin:diffuse erythema and dry macerated skin on bilateral LE pre-tibial region. Neurologic: CN 2-12 grossly intact. Unable to lift LE against gravity Psychiatric: Normal judgment and insight. Alert and oriented x 3. Normal mood.   Data Reviewed:  See HPI  Assessment and Plan: * UTI (urinary tract infection) -hx of ESBL UTI -UA with small leukocyte, positive nitrate and many bacteria.  -Continue meropenem  Chronic pain -continue home PRN oxycodone   CKD (chronic kidney disease), stage IIIa (HCC) -creatinine is stable around baseline  1.13.  Stasis dermatitis of both legs -suspect LE are at baseline and that increase pain could just be due to new UTI -continue home PRN oxycodone  (HFpEF) heart failure with preserved ejection fraction (HCC) -appears compensated   Weakness -  secondary to UTI and morbid obese habitus  Morbid obesity due to excess calories (HCC) BMI of 60  HTN (hypertension) -controlled -continue metoprolol, spironolactone and torsemide      Advance Care Planning:   Code Status: Full Code   Consults: none  Family Communication: none at bedside  Severity of Illness: The appropriate patient status for this patient is INPATIENT. Inpatient status is judged to be reasonable and necessary in order to provide the required intensity of service to ensure the patient's safety. The patient's presenting symptoms, physical exam findings, and initial radiographic and laboratory data in the context of their chronic comorbidities is felt to place them at high risk for further clinical deterioration. Furthermore, it is not anticipated that the patient will be medically stable for discharge from the hospital within 2 midnights of admission.   * I certify that at the point of admission it is my clinical judgment that the patient will require inpatient hospital care spanning beyond 2 midnights from the point of admission due to high intensity of service, high risk for further deterioration and high frequency of surveillance required.*  Author: Anselm Jungling, DO 08/30/2023 9:25 PM  For on call review [email or web address].

## 2023-08-30 NOTE — Assessment & Plan Note (Signed)
-  hx of ESBL UTI -UA with small leukocyte, positive nitrate and many bacteria.  -Continue meropenem

## 2023-08-30 NOTE — ED Notes (Signed)
Patient initially refused morphine d/t side effect of the medication. But patient change her mind and want to take morphine d/t her hip pain. Will continue to monitor.

## 2023-08-30 NOTE — Progress Notes (Signed)
PHARMACY -  BRIEF ANTIBIOTIC NOTE   Pharmacy has received consult(s) for meropenem from an ED provider.  The patient's profile has been reviewed for ht/wt/allergies/indication/available labs.    One time order(s) placed for meropenem 1 g  Further antibiotics/pharmacy consults should be ordered by admitting physician if indicated.                       Thank you,  Pricilla Riffle, PharmD, BCPS Clinical Pharmacist 08/30/2023 7:19 PM

## 2023-08-30 NOTE — Assessment & Plan Note (Signed)
-  creatinine is stable around baseline 1.13.

## 2023-08-30 NOTE — Assessment & Plan Note (Addendum)
-  continue home PRN oxycodone

## 2023-08-30 NOTE — Assessment & Plan Note (Signed)
BMI of 60

## 2023-08-30 NOTE — Progress Notes (Signed)
Pharmacy Antibiotic Note  Maria [name] is a 67 y.o. female admitted on 08/30/2023 with leg pain. Pharmacy has been consulted for meropenem dosing for UTI, history of ESBL K.pneumo in the urine in February 2024.  Plan: -Meropenem 1 g IV q8h -Continue to follow renal function, cultures and clinical progress for dose adjustments and de-escalation as indicated     Temp (24hrs), Avg:97.7 F (36.5 C), Min:97.6 F (36.4 C), Max:97.9 F (36.6 C)  Recent Labs  Lab 08/30/23 1621  WBC 10.8*  CREATININE 1.13*    CrCl cannot be calculated (Unknown ideal weight.).    Allergies  Allergen Reactions   Amoxicillin-Pot Clavulanate Nausea And Vomiting    Projectile vomiting Did it involve swelling of the face/tongue/throat, SOB, or low BP? No Did it involve sudden or severe rash/hives, skin peeling, or any reaction on the inside of your mouth or nose? No Did you need to seek medical attention at a hospital or doctor's office? No When did it last happen?      20-30 years If all above answers are "NO", may proceed with cephalosporin use.    Adhesive [Tape] Other (See Comments)    Tears skin off - use paper tape    Codeine Other (See Comments)    hallucinations   Crestor [Rosuvastatin Calcium]     Severe muscle cramps   Lyrica [Pregabalin] Other (See Comments)    weakness   Morphine And Codeine Nausea And Vomiting and Other (See Comments)    Migraine headaches   Vicodin [Hydrocodone-Acetaminophen] Other (See Comments)    hallucinations   Latex Rash    Antimicrobials this admission: Meropenem 9/9 >>  Dose adjustments this admission: NA  Microbiology results: 9/9 UCx: pending    Thank you for allowing pharmacy to be a part of this patient's care.  Pricilla Riffle, PharmD, BCPS Clinical Pharmacist 08/30/2023 9:11 PM

## 2023-08-30 NOTE — Assessment & Plan Note (Signed)
-  suspect LE are at baseline and that increase pain could just be due to new UTI -continue home PRN oxycodone

## 2023-08-30 NOTE — ED Notes (Signed)
Attempted to ambulate pt w/ assistance, pt had a lot of difficulty moving their left leg. Pt was unable to stand.

## 2023-08-30 NOTE — ED Provider Notes (Signed)
[place] [facility] AT [facility] Provider Note   CSN: [id number] Arrival date & time: 08/30/23  1332     History  Chief Complaint  Patient presents with   Leg Pain    Maria [name] is a 67 y.o. female.  HPI 67 year old female presents with bilateral leg pain and weakness.  This is a recurrent problem this been ongoing for the past 6+ weeks.  She was discharged from [facility] rehabilitation about 2 weeks ago.  She states she has been doing poorly at home.  Today she was walking and her legs were hurting so bad and eventually when she got to the car she ended up falling to the ground because her legs gave out due to weakness.  She landed on both knees and they are now hurting worse than typical.  No numbness.  She has chronic back pain but that is unchanged.  No bowel or bladder incontinence.  Has not had a recent fever.  She does feel like over the last week or so she has been having worsening bilateral leg swelling despite taking her torsemide. Pain is currently severe.  Home Medications Prior to Admission medications   Medication Sig Start Date End Date Taking? Authorizing Provider  cholecalciferol (VITAMIN D3) 25 MCG (1000 UNIT) tablet Take 2,000 Units by mouth daily.   Yes [provider]  clonazePAM (KLONOPIN) 1 MG tablet TAKE ONE TABLET BY MOUTH TWICE DAILY AS NEEDED FOR ANXIETY Patient taking differently: Take 1 mg by mouth 2 (two) times daily as needed for anxiety. for anxiety 06/08/23  Yes Corwin Levins, MD  cyanocobalamin 1000 MCG tablet Take 1 tablet (1,000 mcg total) by mouth daily. 07/18/23  Yes Alicia Amel, MD  DULoxetine (CYMBALTA) 20 MG capsule Take 20 mg by mouth daily.   Yes [provider]  fluticasone (FLONASE) 50 MCG/ACT nasal spray Place 1 spray into both nostrils daily. 12/07/22  Yes [provider]  Metamucil Fiber CHEW Chew 3 tablets by mouth at bedtime.   Yes [provider]  metoprolol succinate  (TOPROL-XL) 25 MG 24 hr tablet Take 1 tablet (25 mg total) by mouth daily. 07/18/23  Yes Alicia Amel, MD  MOUNJARO 10 MG/0.5ML Pen Inject 10 mg into the skin once a week. 06/29/23  Yes [provider]  NARCAN 4 MG/0.1ML LIQD nasal spray kit Place 0.4 mg into the nose daily as needed (opioid overdose). 04/25/19  Yes [provider]  Oxycodone HCl 10 MG TABS Take 1 tablet (10 mg total) 4 (four) times daily by mouth. Per [facility] Patient taking differently: Take 10 mg by mouth every 4 (four) hours as needed (pain). Per [facility] 11/08/17  Yes Corwin Levins, MD  pantoprazole (PROTONIX) 40 MG tablet TAKE ONE TABLET BY MOUTH DAILY Patient taking differently: Take 40 mg by mouth daily. 01/29/22  Yes Corwin Levins, MD  potassium chloride SA (KLOR-CON M) 20 MEQ tablet Take 2 tablets (40 mEq total) by mouth 2 (two) times daily. 07/17/23  Yes Cyndia Skeeters, DO  pramipexole (MIRAPEX) 0.5 MG tablet Take 1 tablet (0.5 mg total) by mouth 3 (three) times daily. 06/24/22  Yes Corwin Levins, MD  spironolactone (ALDACTONE) 25 MG tablet Take 0.5 tablets (12.5 mg total) by mouth daily. Patient taking differently: Take 25 mg by mouth daily. 08/14/22  Yes Sharlene Dory, NP  torsemide (DEMADEX) 20 MG tablet Take 2 tablets (40 mg total) by mouth daily. 07/17/23  Yes  Alicia Amel, MD  Blood Glucose Monitoring Suppl (ACCU-CHEK GUIDE) w/Device KIT Use as directed Patient taking differently: 1 each by Other route as directed. 04/11/21   Zigmund Daniel., MD  glucose blood (ACCU-CHEK GUIDE) test strip Use as instructed up to 4 times daily 04/11/21   Zigmund Daniel., MD      Allergies    Amoxicillin-pot clavulanate, Adhesive [tape], Codeine, Crestor [rosuvastatin calcium], Lyrica [pregabalin], Morphine and codeine, Vicodin [hydrocodone-acetaminophen], and Latex    Review of Systems   Review of Systems  Constitutional:  Negative for fever.  Respiratory:  Negative for  shortness of breath.   Cardiovascular:  Positive for leg swelling. Negative for chest pain.  Gastrointestinal:  Positive for abdominal pain (chronic, unchanged) and constipation.  Genitourinary:  Positive for frequency. Negative for dysuria.  Musculoskeletal:  Positive for arthralgias and back pain.  Neurological:  Positive for weakness.    Physical Exam Updated Vital Signs BP (!) 106/43 (BP Location: Right Arm)   Pulse 90   Temp 98.6 F (37 C) (Oral)   Resp 16   SpO2 96%  Physical Exam Vitals and nursing note reviewed.  Constitutional:      Appearance: She is well-developed. She is obese.  HENT:     Head: Normocephalic and atraumatic.  Cardiovascular:     Rate and Rhythm: Normal rate and regular rhythm.     Pulses:          Dorsalis pedis pulses are 2+ on the right side and 2+ on the left side.     Heart sounds: Normal heart sounds.  Pulmonary:     Effort: Pulmonary effort is normal.     Breath sounds: Normal breath sounds.  Abdominal:     Palpations: Abdomen is soft.  Musculoskeletal:     Right knee: Tenderness present.     Left knee: Tenderness present.     Right lower leg: Edema present.     Left lower leg: Edema present.     Comments: Both lower legs are edematous. The left lower leg is tender. Both have equal warmth. Both have erythema c/w stasis dermatitis.  Skin:    General: Skin is warm and dry.  Neurological:     Mental Status: She is alert.     Comments: Is unable to lift either leg off of the stretcher. Grossly normal sensation in both legs     ED Results / Procedures / Treatments   Labs (all labs ordered are listed, but only abnormal results are displayed) Labs Reviewed  COMPREHENSIVE METABOLIC PANEL - Abnormal; Notable for the following components:      Result Value   Creatinine, Ser 1.13 (*)    GFR, Estimated 54 (*)    All other components within normal limits  CBC WITH DIFFERENTIAL/PLATELET - Abnormal; Notable for the following components:   WBC  10.8 (*)    RBC 3.84 (*)    Hemoglobin 11.4 (*)    All other components within normal limits  URINALYSIS, W/ REFLEX TO CULTURE (INFECTION SUSPECTED) - Abnormal; Notable for the following components:   APPearance HAZY (*)    Hgb urine dipstick SMALL (*)    Nitrite POSITIVE (*)    Leukocytes,Ua SMALL (*)    Bacteria, UA MANY (*)    All other components within normal limits  URINE CULTURE  BRAIN NATRIURETIC PEPTIDE  CBC  TROPONIN I (HIGH SENSITIVITY)    EKG EKG Interpretation Date/Time:  Monday August 30 2023 16:10:40 EDT Ventricular Rate:  88 PR Interval:  178 QRS Duration:  102 QT Interval:  360 QTC Calculation: 436 R Axis:   77  Text Interpretation: Sinus rhythm Low voltage, precordial leads Borderline T abnormalities, anterior leads similar to July 2024 Confirmed by Pricilla Loveless [phone]) on 08/30/2023 4:33:30 PM  Radiology DG Tibia/Fibula Left  Result Date: 08/30/2023 CLINICAL DATA:  Pain after fall EXAM: LEFT TIBIA AND FIBULA - 2 VIEW COMPARISON:  None Available. FINDINGS: No acute fracture or dislocation. Preserved bone mineralization. Degenerative changes seen of the knee joint as well as about the ankle with the calcaneal spurs. Soft tissue swelling. IMPRESSION: Soft tissue swelling.  No acute osseous abnormality. Electronically Signed   By: Karen Kays M.D.   On: 08/30/2023 17:51   DG Knee Complete 4 Views Right  Result Date: 08/30/2023 CLINICAL DATA:  Pain after fall. EXAM: RIGHT KNEE - COMPLETE 5 VIEW COMPARISON:  None Available. FINDINGS: Tricompartmental degenerative changes identified. There is moderate joint space loss of the medial compartment. Slight medial translation of the distal femur relative to the tibia. No joint effusion. No fracture or dislocation. Under penetrated radiographs. IMPRESSION: Limited radiographs. Tricompartmental degenerative changes greatest of the medial compartment. Electronically Signed   By: Karen Kays M.D.   On: 08/30/2023 17:50    DG Knee Complete 4 Views Left  Result Date: 08/30/2023 CLINICAL DATA:  Pain after fall EXAM: LEFT KNEE - COMPLETE 3 VIEW COMPARISON:  None Available. FINDINGS: No fracture or dislocation. Preserved joint spaces and bone mineralization except for joint space loss of the medial compartment. There osteophyte formation seen of all 3 compartments. No joint effusion on lateral view. Limited portable x-rays. IMPRESSION: Portable x-rays.  Some limitation with under penetration. Tricompartmental degenerative changes seen greatest of the medial compartment. Electronically Signed   By: Karen Kays M.D.   On: 08/30/2023 17:49   DG Chest 2 View  Result Date: 08/30/2023 CLINICAL DATA:  Leg swelling. EXAM: CHEST - 2 VIEW COMPARISON:  X-ray 02/07/2023. FINDINGS: Enlarged cardiopericardial silhouette with some vascular congestion. No frank edema. No pneumothorax, effusion or consolidation. Overlapping cardiac leads. Fixation hardware along the lower cervical spine at the edge of the imaging field. Films are under penetrated. IMPRESSION: Enlarged heart with vascular congestion. Electronically Signed   By: Karen Kays M.D.   On: 08/30/2023 17:47    Procedures Procedures    Medications Ordered in ED Medications  enoxaparin (LOVENOX) injection 70 mg (has no administration in time range)  metoprolol succinate (TOPROL-XL) 24 hr tablet 25 mg (has no administration in time range)  spironolactone (ALDACTONE) tablet 25 mg (has no administration in time range)  torsemide (DEMADEX) tablet 40 mg (has no administration in time range)  DULoxetine (CYMBALTA) DR capsule 20 mg (has no administration in time range)  pantoprazole (PROTONIX) EC tablet 40 mg (has no administration in time range)  clonazePAM (KLONOPIN) tablet 1 mg (has no administration in time range)  cholecalciferol (VITAMIN D3) 25 MCG (1000 UNIT) tablet 2,000 Units (has no administration in time range)  potassium chloride SA (KLOR-CON M) CR tablet 40 mEq (has  no administration in time range)  fluticasone (FLONASE) 50 MCG/ACT nasal spray 1 spray (has no administration in time range)  meropenem (MERREM) 1 g in sodium chloride 0.9 % 100 mL IVPB (has no administration in time range)  oxyCODONE (Oxy IR/ROXICODONE) immediate release tablet 10 mg (has no administration in time range)  psyllium (HYDROCIL/METAMUCIL) 1 packet (has no administration in time range)  cyanocobalamin (VITAMIN B12) tablet 1,000 mcg (  has no administration in time range)  pramipexole (MIRAPEX) tablet 0.5 mg (has no administration in time range)  HYDROmorphone (DILAUDID) injection 1 mg (1 mg Intravenous Given 08/30/23 1557)  meropenem (MERREM) 1 g in sodium chloride 0.9 % 100 mL IVPB (0 g Intravenous Stopped 08/30/23 2136)  morphine (PF) 2 MG/ML injection 2 mg (2 mg Intravenous Given 08/30/23 2048)    ED Course/ Medical Decision Making/ A&P                                 Medical Decision Making Amount and/or Complexity of Data Reviewed Labs: ordered.    Details: Urine with positive nitrites.  Slight leukocytosis. Radiology: ordered and independent interpretation performed.    Details: No fractures. ECG/medicine tests: ordered and independent interpretation performed.    Details: No ischemia.  Risk Prescription drug management. Decision regarding hospitalization.   Patient presents with weakness.  Acute on chronic.  Hard to tell how much is from pain but she does have what appears to be an ESBL UTI (there are some squamous epithelial cells but also positive nitrites and a significant history on chart review).  Left leg is painful although it is the same erythema as the other leg, cellulitis is possible, I think it is unlikely to be a deep space infection.  She will need admission for pain control treatment UTI.  Discussed with Dr. Cyndia Bent.        Final Clinical Impression(s) / ED Diagnoses Final diagnoses:  UTI due to extended-spectrum beta lactamase (ESBL) producing  Escherichia coli    Rx / DC Orders ED Discharge Orders     None         Pricilla Loveless, MD 08/30/23 2300

## 2023-08-31 ENCOUNTER — Other Ambulatory Visit: Payer: Self-pay

## 2023-08-31 DIAGNOSIS — Z1612 Extended spectrum beta lactamase (ESBL) resistance: Secondary | ICD-10-CM | POA: Diagnosis not present

## 2023-08-31 DIAGNOSIS — N390 Urinary tract infection, site not specified: Secondary | ICD-10-CM | POA: Diagnosis not present

## 2023-08-31 DIAGNOSIS — B9629 Other Escherichia coli [E. coli] as the cause of diseases classified elsewhere: Secondary | ICD-10-CM | POA: Diagnosis not present

## 2023-08-31 LAB — CBC
HCT: 33.5 % — ABNORMAL LOW (ref 36.0–46.0)
Hemoglobin: 11.1 g/dL — ABNORMAL LOW (ref 12.0–15.0)
MCH: 29.4 pg (ref 26.0–34.0)
MCHC: 33.1 g/dL (ref 30.0–36.0)
MCV: 88.6 fL (ref 80.0–100.0)
Platelets: 157 10*3/uL (ref 150–400)
RBC: 3.78 MIL/uL — ABNORMAL LOW (ref 3.87–5.11)
RDW: 14.4 % (ref 11.5–15.5)
WBC: 6.6 10*3/uL (ref 4.0–10.5)
nRBC: 0 % (ref 0.0–0.2)

## 2023-08-31 MED ORDER — CEFADROXIL 500 MG PO CAPS
1000.0000 mg | ORAL_CAPSULE | Freq: Two times a day (BID) | ORAL | Status: DC
  Administered 2023-08-31 – 2023-09-01 (×2): 1000 mg via ORAL
  Filled 2023-08-31 (×2): qty 2

## 2023-08-31 MED ORDER — HYDROMORPHONE HCL 1 MG/ML IJ SOLN
1.0000 mg | INTRAMUSCULAR | Status: DC | PRN
  Administered 2023-08-31 – 2023-09-01 (×6): 1 mg via INTRAVENOUS
  Filled 2023-08-31 (×7): qty 1

## 2023-08-31 MED ORDER — GENTAMICIN SULFATE 40 MG/ML IJ SOLN
400.0000 mg | Freq: Once | INTRAVENOUS | Status: AC
  Administered 2023-08-31: 400 mg via INTRAVENOUS
  Filled 2023-08-31: qty 10

## 2023-08-31 NOTE — TOC Progression Note (Signed)
Transition of Care [facility]) - Progression Note    Patient Details  Name: Maria [name] MRN: [id number] Date of Birth: [birth year]  [facility] CM/SW Contact  Geni Bers, RN Phone Number: 08/31/2023, 12:24 PM  Clinical Narrative:    Spoke with pt concerning discharge plan. Pt states she live alone, medications, grocery are delivered to her, she discharged from [facility] rehab on 08/19/2023. Pt continue to state that her legs are weak. TOC will continue to follow.    Expected Discharge Plan: Skilled Nursing Facility Barriers to Discharge: No Barriers Identified  Expected Discharge Plan and Services       Living arrangements for the past 2 months: Single Family Home                                       Social Determinants of Health (SDOH) Interventions SDOH Screenings   Food Insecurity: No Food Insecurity (07/29/2023)  Housing: Low Risk  (07/29/2023)  Transportation Needs: No Transportation Needs (07/29/2023)  Utilities: Not At Risk (07/29/2023)  Alcohol Screen: Low Risk  (12/30/2021)  Depression (PHQ2-9): Medium Risk (01/30/2022)  Financial Resource Strain: Medium Risk (12/30/2021)  Physical Activity: Inactive (12/30/2021)  Social Connections: Unknown (09/17/2022)   Received from [facility]  Stress: No Stress Concern Present (12/30/2021)  Tobacco Use: Medium Risk (08/30/2023)    Readmission Risk Interventions    07/22/2022    2:59 PM 04/11/2021   11:40 AM  Readmission Risk Prevention Plan  Transportation Screening Complete Complete  PCP or Specialist Appt within 3-5 Days Complete Complete  HRI or Home Care Consult Complete Complete  Social Work Consult for Recovery Care Planning/Counseling Complete Complete  Palliative Care Screening Complete Not Applicable  Medication Review Oceanographer) Complete Complete

## 2023-08-31 NOTE — Evaluation (Signed)
Physical Therapy Evaluation Patient Details Name: Amerah Blomgren Noyes MRN: [id number] DOB: 1956-11-07 Today's Date: 08/31/2023  History of Present Illness  Tatianna Bottomley Chicoine is a 67 y.o. female with medical history significant of HTN, stasis dermatitis of bilateral LE, chronic diastolic HF, OSA, CKD 3a, sclerosing mesenteritis, T2DM, GERD, anxiety depression, morbid obesity, chronic pain syndrome, ESBL UTI who presents 08/30/23 with weakness and leg pain. Had a fall  after descending steps  Clinical Impression  Pt admitted with above diagnosis.  Pt currently with functional limitations due to the deficits listed below (see PT Problem List). Pt will benefit from acute skilled PT to increase their independence and safety with mobility to allow discharge.     The patient  expresses concern that her legs are very weak and she had annother fall patient  reports ambyulatory in her apartment short distances since [facility] from  [facility] 8/29.  Patient reports that she had negotiated down the steps  then legs gave out.  Patient agreeable to returning to [facility].  Patient did  dangle on bed edge, will progress to standing /transfers as patient able .      If plan is discharge home, recommend the following: Two people to help with walking and/or transfers;Two people to help with bathing/dressing/bathroom;Assistance with cooking/housework;Assist for transportation;Help with stairs or ramp for entrance   Can travel by private vehicle   No    Equipment Recommendations None recommended by PT  Recommendations for Other Services       Functional Status Assessment Patient has had a recent decline in their functional status and demonstrates the ability to make significant improvements in function in a reasonable and predictable amount of time.     Precautions / Restrictions Precautions Precautions: Fall Restrictions Weight Bearing Restrictions: No      Mobility  Bed Mobility Overal bed mobility: Needs Assistance Bed  Mobility: Rolling, Supine to Sit, Sit to Supine Rolling: Mod assist, Used rails   Supine to sit: +2 for safety/equipment, HOB elevated, +2 for physical assistance, Max assist Sit to supine: +2 for safety/equipment, +2 for physical assistance, Total assist   General bed mobility comments: able to move l;egs forward once rolling, max support to raise triunk to sitting and total support of legs back onto bed.    Transfers                   General transfer comment: NT    Ambulation/Gait                  Stairs            Wheelchair Mobility     Tilt Bed    Modified Rankin (Stroke Patients Only)       Balance Overall balance assessment: History of Falls, Needs assistance Sitting-balance support: Bilateral upper extremity supported, Feet supported Sitting balance-Leahy Scale: Fair                                       Pertinent Vitals/Pain Pain Assessment Pain Assessment: Faces Faces Pain Scale: Hurts whole lot Pain Location: both knees and  back Pain Descriptors / Indicators: Aching, Discomfort Pain Intervention(s): Monitored during session, Premedicated before session, Limited activity within patient's tolerance    Home Living Family/patient expects to be discharged to:: Private residence Living Arrangements: Alone Available Help at Discharge: Family;Friend(s);Available PRN/intermittently Type of Home: Apartment Home Access: Stairs to enter Entrance  Stairs-Rails: Right Entrance Stairs-Number of Steps: 6- 3 deep steps with 3 step lengths each step   Home Layout: One level Home Equipment: Rollator (4 wheels);Cane - single point;BSC/3in1;Shower seat;Hand held shower head;Grab bars - tub/shower;Adaptive equipment;Rolling Walker (2 wheels);Hospital bed      Prior Function Prior Level of Function : Needs assist;History of Falls (last six months)       Physical Assist : Mobility (physical)     Mobility Comments: uses RW in  house and rollator outside, reliant on support of rail for stairs. reports this fall occurred after descending steps to get to transportation. ADLs Comments: Indep with adaptive equipment, uses shower chair, has groceries delivered, doesn't drive     Extremity/Trunk Assessment   Upper Extremity Assessment Upper Extremity Assessment: Overall WFL for tasks assessed    Lower Extremity Assessment Lower Extremity Assessment: RLE deficits/detail;LLE deficits/detail RLE Deficits / Details: limited in  knee flexion,  cannot perform SLR in supine LLE Deficits / Details: similar to right    Cervical / Trunk Assessment Cervical / Trunk Assessment: Other exceptions Cervical / Trunk Exceptions: bodyy habitus  Communication      Cognition Arousal: Alert Behavior During Therapy: WFL for tasks assessed/performed Overall Cognitive Status: Within Functional Limits for tasks assessed                                          General Comments      Exercises     Assessment/Plan    PT Assessment Patient needs continued PT services  PT Problem List Decreased strength;Decreased mobility;Decreased safety awareness;Obesity;Decreased range of motion;Decreased activity tolerance;Decreased balance;Pain       PT Treatment Interventions DME instruction;Therapeutic activities;Gait training;Therapeutic exercise;Patient/family education;Functional mobility training    PT Goals (Current goals can be found in the Care Plan section)  Acute Rehab PT Goals Patient Stated Goal: to walk, go home PT Goal Formulation: With patient Time For Goal Achievement: 09/14/23 Potential to Achieve Goals: Fair    Frequency Min 1X/week     Co-evaluation               AM-PAC PT "6 Clicks" Mobility  Outcome Measure Help needed turning from your back to your side while in a flat bed without using bedrails?: A Lot Help needed moving from lying on your back to sitting on the side of a flat bed  without using bedrails?: Total Help needed moving to and from a bed to a chair (including a wheelchair)?: Total Help needed standing up from a chair using your arms (e.g., wheelchair or bedside chair)?: Total Help needed to walk in hospital room?: Total Help needed climbing 3-5 steps with a railing? : Total 6 Click Score: 7    End of Session   Activity Tolerance: Patient tolerated treatment well;Patient limited by pain Patient left: in bed;with call bell/phone within reach Nurse Communication: Mobility status PT Visit Diagnosis: Unsteadiness on feet (R26.81);Muscle weakness (generalized) (M62.81);Difficulty in walking, not elsewhere classified (R26.2);Pain;Repeated falls (R29.6) Pain - part of body: Knee    Time: 1458-1540 PT Time Calculation (min) (ACUTE ONLY): 42 min   Charges:   PT Evaluation $PT Eval Low Complexity: 1 Low PT Treatments $Therapeutic Activity: 8-22 mins $Self Care/Home Management: 8-22 PT General Charges $$ ACUTE PT VISIT: 1 Visit         Blanchard Kelch PT Acute Rehabilitation Services Office [phone] Weekend pager-[phone]   [place],  Jobe Igo 08/31/2023, 4:27 PM

## 2023-08-31 NOTE — ED Notes (Signed)
ED TO INPATIENT HANDOFF REPORT  ED Nurse Name and Phone #:  Leatrice Jewels [phone]  S Name/Age/Gender Maria [name] A Helf 67 y.o. female Room/Bed: WA10/WA10  Code Status   Code Status: Full Code  Home/SNF/Other Home Patient oriented to: self, place, time, and situation Is this baseline? Yes   Triage Complete: Triage complete  Chief Complaint UTI (urinary tract infection) [N39.0]  Triage Note BIB [facility] from home c/o leg pain. DC from rehab last week. Chronic leg pain In route to pain management today to place pain pump but unable to continue ambulating. Lowered herself to the ground and called EMS. Uses walker for ambulation. Denies LOC, head injuries or blood thinners   Allergies Allergies  Allergen Reactions   Amoxicillin-Pot Clavulanate Nausea And Vomiting    Projectile vomiting Did it involve swelling of the face/tongue/throat, SOB, or low BP? No Did it involve sudden or severe rash/hives, skin peeling, or any reaction on the inside of your mouth or nose? No Did you need to seek medical attention at a hospital or doctor's office? No When did it last happen?      20-30 years If all above answers are "NO", may proceed with cephalosporin use.    Adhesive [Tape] Other (See Comments)    Tears skin off - use paper tape    Codeine Other (See Comments)    hallucinations   Crestor [Rosuvastatin Calcium]     Severe muscle cramps   Lyrica [Pregabalin] Other (See Comments)    weakness   Morphine And Codeine Nausea And Vomiting and Other (See Comments)    Migraine headaches   Vicodin [Hydrocodone-Acetaminophen] Other (See Comments)    hallucinations   Latex Rash    Level of Care/Admitting Diagnosis ED Disposition     ED Disposition  Admit   Condition  --   Comment  Hospital Area: [facility] [[id number]]  Level of Care: Telemetry [5]  Admit to tele based on following criteria: Other see comments  Comments: RATE  May admit patient to [name] or [facility] if equivalent level of care is available:: No  Covid Evaluation: Asymptomatic - no recent exposure (last 10 days) testing not required  Diagnosis: UTI (urinary tract infection) [[id number]]  Admitting Physician: Anselm Jungling [[id number]]  Attending Physician: Anselm Jungling [[id number]]  Certification:: I certify this patient will need inpatient services for at least 2 midnights  Expected Medical Readiness: 09/01/2023          B Medical/Surgery History Past Medical History:  Diagnosis Date   Acute lymphadenitis 2011   ALLERGIC RHINITIS 08/10/2007   ANXIETY 12/06/2007   Atherosclerotic peripheral vascular disease (HCC) 06/13/2013   Aorta on CT June 2014   Cellulitis 2011   Cervical disc disease 03/09/2012   CHF (congestive heart failure) (HCC)    Chronic pain 03/09/2012   DEPRESSION 12/06/2007   Diabetes (HCC)    DIVERTICULOSIS, Aquilar 12/06/2007   GERD 12/06/2007   Hepatitis    age 67 hepatitis A   HLD (hyperlipidemia) 05/24/2019   HYPERTENSION 12/06/2007   Lumbar disc disease 03/09/2012   Mesenteric adenitis    MRSA 2006   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 2010   CERVICAL DISC SURGERY  may 24th 2010   COLONOSCOPY WITH PROPOFOL  N/A 07/10/2016   Procedure: COLONOSCOPY WITH PROPOFOL;  Surgeon: Ruffin Frederick, MD;  Location: [facility];  Service: Gastroenterology;  Laterality: N/A;   COLONOSCOPY WITH PROPOFOL N/A 09/26/2019   Procedure: COLONOSCOPY WITH PROPOFOL;  Surgeon: Benancio Deeds, MD;  Location: [facility];  Service: Gastroenterology;  Laterality: N/A;   ESOPHAGOGASTRODUODENOSCOPY (EGD) WITH PROPOFOL N/A 07/10/2016   Procedure: ESOPHAGOGASTRODUODENOSCOPY (EGD) WITH PROPOFOL;  Surgeon: Ruffin Frederick, MD;  Location: [facility];  Service:  Gastroenterology;  Laterality: N/A;   POLYPECTOMY  09/26/2019   Procedure: POLYPECTOMY;  Surgeon: Benancio Deeds, MD;  Location: [facility];  Service: Gastroenterology;;   RADIOLOGY WITH ANESTHESIA N/A 07/30/2023   Procedure: MRI WITH ANESTHESIA - THORACIC LUMBAR WITHOUT CONSTRAST;  Surgeon: Radiologist, Medication, MD;  Location: [facility];  Service: Radiology;  Laterality: N/A;   s/p ovary cyst     s/p right knee arthroscopy     Dr. Dion Saucier ortho     A IV Location/Drains/Wounds Patient Lines/Drains/Airways Status     Active Line/Drains/Airways     Name Placement date Placement time Site Days   Peripheral IV 08/30/23 20 G Left Antecubital 08/30/23  1557  Antecubital  1   Wound / Incision (Open or Dehisced) 02/08/23 Irritant Dermatitis (Moisture Associated Skin Damage) Buttocks Medial 02/08/23  0120  Buttocks  204   Wound / Incision (Open or Dehisced) 02/08/23 Other (Comment) Pretibial Right 02/08/23  0635  Pretibial  204   Wound / Incision (Open or Dehisced) 02/08/23 Other (Comment) Pretibial Left 02/08/23  0636  Pretibial  204   Wound / Incision (Open or Dehisced) 08/02/23 Vagina Right skin tears, redness 08/02/23  1114  Vagina  29            Intake/Output Last 24 hours  Intake/Output Summary (Last 24 hours) at 08/31/2023 0951 Last data filed at 08/30/2023 2136 Gross per 24 hour  Intake 100 ml  Output --  Net 100 ml    Labs/Imaging Results for orders placed or performed during the hospital encounter of 08/30/23 (from the past 48 hour(s))  Comprehensive metabolic panel     Status: Abnormal   Collection Time: 08/30/23  4:21 PM  Result Value Ref Range   Sodium 138 135 - 145 mmol/L   Potassium 5.0 3.5 - 5.1 mmol/L   Chloride 100 98 - 111 mmol/L   CO2 29 22 - 32 mmol/L   Glucose, Bld 99 70 - 99 mg/dL    Comment: Glucose reference range applies only to samples taken after fasting for at least 8 hours.   BUN 15 8 - 23 mg/dL   Creatinine, Ser 7.82 (H) 0.44 - 1.00 mg/dL    Calcium 9.1 8.9 - 95.6 mg/dL   Total Protein 6.9 6.5 - 8.1 g/dL   Albumin 3.6 3.5 - 5.0 g/dL   AST 23 15 - 41 U/L   ALT 22 0 - 44 U/L   Alkaline Phosphatase 80 38 - 126 U/L   Total Bilirubin 1.1 0.3 - 1.2 mg/dL   GFR, Estimated 54 (L) >60 mL/min    Comment: (NOTE) Calculated using the CKD-EPI Creatinine Equation (2021)    Anion gap 9 5 - 15    Comment: Performed at [facility], [address], [place], [place] [address]  CBC with Differential     Status: Abnormal   Collection Time: 08/30/23  4:21 PM  Result Value Ref Range   WBC 10.8 (H) 4.0 - 10.5 K/uL   RBC 3.84 (L) 3.87 -  5.11 MIL/uL   Hemoglobin 11.4 (L) 12.0 - 15.0 g/dL   HCT 54.0 98.1 - 19.1 %   MCV 94.3 80.0 - 100.0 fL   MCH 29.7 26.0 - 34.0 pg   MCHC 31.5 30.0 - 36.0 g/dL   RDW 47.8 29.5 - 62.1 %   Platelets 215 150 - 400 K/uL   nRBC 0.0 0.0 - 0.2 %   Neutrophils Relative % 64 %   Neutro Abs 7.1 1.7 - 7.7 K/uL   Lymphocytes Relative 26 %   Lymphs Abs 2.8 0.7 - 4.0 K/uL   Monocytes Relative 7 %   Monocytes Absolute 0.7 0.1 - 1.0 K/uL   Eosinophils Relative 2 %   Eosinophils Absolute 0.2 0.0 - 0.5 K/uL   Basophils Relative 1 %   Basophils Absolute 0.1 0.0 - 0.1 K/uL   Immature Granulocytes 0 %   Abs Immature Granulocytes 0.03 0.00 - 0.07 K/uL    Comment: Performed at [facility], [address] [place], [place] [address]  Troponin I (High Sensitivity)     Status: None   Collection Time: 08/30/23  4:21 PM  Result Value Ref Range   Troponin I (High Sensitivity) 7 <18 ng/L    Comment: (NOTE) Elevated high sensitivity troponin I (hsTnI) values and significant  changes across serial measurements may suggest ACS but many other  chronic and acute conditions are known to elevate hsTnI results.  Refer to the "Links" section for chest pain algorithms and additional  guidance. Performed at [facility], [address] [place], [place] [address]   Brain  natriuretic peptide     Status: None   Collection Time: 08/30/23  4:23 PM  Result Value Ref Range   B Natriuretic Peptide 17.1 0.0 - 100.0 pg/mL    Comment: Performed at [facility], [address], [place], [place] [address]  Urinalysis, w/ Reflex to Culture (Infection Suspected) -Urine, Clean Catch     Status: Abnormal   Collection Time: 08/30/23  4:23 PM  Result Value Ref Range   Specimen Source URINE, CLEAN CATCH    Color, Urine YELLOW YELLOW   APPearance HAZY (A) CLEAR   Specific Gravity, Urine 1.008 1.005 - 1.030   pH 6.0 5.0 - 8.0   Glucose, UA NEGATIVE NEGATIVE mg/dL   Hgb urine dipstick SMALL (A) NEGATIVE   Bilirubin Urine NEGATIVE NEGATIVE   Ketones, ur NEGATIVE NEGATIVE mg/dL   Protein, ur NEGATIVE NEGATIVE mg/dL   Nitrite POSITIVE (A) NEGATIVE   Leukocytes,Ua SMALL (A) NEGATIVE   RBC / HPF 0-5 0 - 5 RBC/hpf   WBC, UA 6-10 0 - 5 WBC/hpf    Comment:        Reflex urine culture not performed if WBC <=10, OR if Squamous epithelial cells >5. If Squamous epithelial cells >5 suggest recollection.    Bacteria, UA MANY (A) NONE SEEN   Squamous Epithelial / HPF 6-10 0 - 5 /HPF   Mucus PRESENT     Comment: Performed at [facility], [address], [place], [place] [address]  CBC     Status: Abnormal   Collection Time: 08/30/23 11:55 PM  Result Value Ref Range   WBC 6.6 4.0 - 10.5 K/uL   RBC 3.78 (L) 3.87 - 5.11 MIL/uL   Hemoglobin 11.1 (L) 12.0 - 15.0 g/dL   HCT 44.0 (L) 10.2 - 72.5 %   MCV 88.6 80.0 - 100.0 fL   MCH 29.4 26.0 - 34.0  pg   MCHC 33.1 30.0 - 36.0 g/dL   RDW 62.9 52.8 - 41.3 %   Platelets 157 150 - 400 K/uL   nRBC 0.0 0.0 - 0.2 %    Comment: Performed at [facility], [address], [place], [place] [address]   DG Tibia/Fibula Left  Result Date: 08/30/2023 CLINICAL DATA:  Pain after fall EXAM: LEFT TIBIA AND FIBULA - 2 VIEW COMPARISON:  None Available. FINDINGS: No acute fracture or dislocation.  Preserved bone mineralization. Degenerative changes seen of the knee joint as well as about the ankle with the calcaneal spurs. Soft tissue swelling. IMPRESSION: Soft tissue swelling.  No acute osseous abnormality. Electronically Signed   By: Karen Kays M.D.   On: 08/30/2023 17:51   DG Knee Complete 4 Views Right  Result Date: 08/30/2023 CLINICAL DATA:  Pain after fall. EXAM: RIGHT KNEE - COMPLETE 5 VIEW COMPARISON:  None Available. FINDINGS: Tricompartmental degenerative changes identified. There is moderate joint space loss of the medial compartment. Slight medial translation of the distal femur relative to the tibia. No joint effusion. No fracture or dislocation. Under penetrated radiographs. IMPRESSION: Limited radiographs. Tricompartmental degenerative changes greatest of the medial compartment. Electronically Signed   By: Karen Kays M.D.   On: 08/30/2023 17:50   DG Knee Complete 4 Views Left  Result Date: 08/30/2023 CLINICAL DATA:  Pain after fall EXAM: LEFT KNEE - COMPLETE 3 VIEW COMPARISON:  None Available. FINDINGS: No fracture or dislocation. Preserved joint spaces and bone mineralization except for joint space loss of the medial compartment. There osteophyte formation seen of all 3 compartments. No joint effusion on lateral view. Limited portable x-rays. IMPRESSION: Portable x-rays.  Some limitation with under penetration. Tricompartmental degenerative changes seen greatest of the medial compartment. Electronically Signed   By: Karen Kays M.D.   On: 08/30/2023 17:49   DG Chest 2 View  Result Date: 08/30/2023 CLINICAL DATA:  Leg swelling. EXAM: CHEST - 2 VIEW COMPARISON:  X-ray 02/07/2023. FINDINGS: Enlarged cardiopericardial silhouette with some vascular congestion. No frank edema. No pneumothorax, effusion or consolidation. Overlapping cardiac leads. Fixation hardware along the lower cervical spine at the edge of the imaging field. Films are under penetrated. IMPRESSION: Enlarged heart  with vascular congestion. Electronically Signed   By: Karen Kays M.D.   On: 08/30/2023 17:47    Pending Labs Unresulted Labs (From admission, onward)     Start     Ordered   08/30/23 1653  Urine Culture  Once,   URGENT       Question:  Indication  Answer:  Dysuria   08/30/23 1652            Vitals/Pain Today's Vitals   08/31/23 0730 08/31/23 0837 08/31/23 0840 08/31/23 0858  BP: (!) 121/58 (!) 133/50  (!) 133/50  Pulse:  85 83 81  Resp:    17  Temp:      TempSrc:      SpO2:  100% 100% 98%  PainSc:        Isolation Precautions No active isolations  Medications Medications  enoxaparin (LOVENOX) injection 70 mg (70 mg Subcutaneous Given 08/30/23 2303)  metoprolol succinate (TOPROL-XL) 24 hr tablet 25 mg (has no administration in time range)  spironolactone (ALDACTONE) tablet 25 mg (has no administration in time range)  torsemide (DEMADEX) tablet 40 mg (has no administration in time range)  DULoxetine (CYMBALTA) DR capsule 20 mg (has no administration in time range)  pantoprazole (PROTONIX) EC tablet 40 mg (  has no administration in time range)  clonazePAM (KLONOPIN) tablet 1 mg (1 mg Oral Given 08/30/23 2303)  cholecalciferol (VITAMIN D3) 25 MCG (1000 UNIT) tablet 2,000 Units (has no administration in time range)  potassium chloride SA (KLOR-CON M) CR tablet 40 mEq (has no administration in time range)  fluticasone (FLONASE) 50 MCG/ACT nasal spray 1 spray (has no administration in time range)  meropenem (MERREM) 1 g in sodium chloride 0.9 % 100 mL IVPB (0 g Intravenous Stopped 08/31/23 0654)  oxyCODONE (Oxy IR/ROXICODONE) immediate release tablet 10 mg (10 mg Oral Given 08/31/23 0654)  psyllium (HYDROCIL/METAMUCIL) 1 packet (has no administration in time range)  cyanocobalamin (VITAMIN B12) tablet 1,000 mcg (has no administration in time range)  pramipexole (MIRAPEX) tablet 0.5 mg (0.5 mg Oral Given 08/30/23 2304)  HYDROmorphone (DILAUDID) injection 1 mg (1 mg Intravenous Given  08/31/23 0837)  HYDROmorphone (DILAUDID) injection 1 mg (1 mg Intravenous Given 08/30/23 1557)  meropenem (MERREM) 1 g in sodium chloride 0.9 % 100 mL IVPB (0 g Intravenous Stopped 08/30/23 2136)  morphine (PF) 2 MG/ML injection 2 mg (2 mg Intravenous Given 08/30/23 2048)    Mobility walks with device     Focused Assessments     R Recommendations: See Admitting Provider Note  Report given to:   Additional Notes:

## 2023-08-31 NOTE — ED Notes (Signed)
Pt in bed at this time complaining of pain. Pt in not obvious distress. Breathing adequately.

## 2023-08-31 NOTE — Plan of Care (Signed)
  Problem: Education: Goal: Knowledge of General Education information will improve Description: Including pain rating scale, medication(s)/side effects and non-pharmacologic comfort measures Outcome: Progressing   Problem: Nutrition: Goal: Adequate nutrition will be maintained Outcome: Progressing   Problem: Pain Managment: Goal: General experience of comfort will improve Outcome: Progressing   Problem: Safety: Goal: Ability to remain free from injury will improve Outcome: Progressing   Problem: Skin Integrity: Goal: Risk for impaired skin integrity will decrease Outcome: Progressing   Problem: Activity: Goal: Risk for activity intolerance will decrease Outcome: Not Progressing   Problem: Coping: Goal: Level of anxiety will decrease Outcome: Not Progressing

## 2023-08-31 NOTE — Progress Notes (Signed)
PROGRESS NOTE    Maria [name]  WNU:[id number] DOB: 01/07/1956 DOA: 08/30/2023 PCP: Ignatius Specking, NP   Brief Narrative: 67 year old morbidly obese female lives at home alone history of hypertension chronic stasis dermatitis both lower extremities, chronic diastolic heart failure, obstructive sleep apnea, type 2 diabetes, GERD anxiety depression, chronic pain syndrome ESBL UTI, CKD stage IIIa admitted with generalized weakness and bilateral lower extremity pain. Patient reports that she has been weak for 8 weeks. She was recently hospitalized at [facility] from 8/7-8/13 with mechanical fall due to LE weakness. She was ultimately discharged to rehab. However felt she was still weak upon returning home. Ambulates with rollator at baseline and today was so weak she could not stand. She has transportation assistance come today to take her to an appointment but even he could not help her stand. She also reports increase LE pain worse on the left. She has chronic stasis dermatitis but feels legs are more red. Denies any fever or chills. Has felt nausea and constipation.  Had noted increase urinary frequency and urgency.  urine appears cloudy In the ER her white count was 10.8 hemoglobin 11.4 CMP unremarkable UA positive nitrites many bacteria small leukocyte She was started on meropenem due to history of ESBL UTI  Assessment & Plan:   Principal Problem:   UTI (urinary tract infection) Active Problems:   Chronic pain   CKD (chronic kidney disease), stage IIIa (HCC)   HTN (hypertension)   Morbid obesity due to excess calories (HCC)   Weakness   (HFpEF) heart failure with preserved ejection fraction (HCC)   Stasis dermatitis of both legs   #1 bilateral lower extremity cellulitis in the setting of chronic stasis dermatitis and chronic lower extremity edema-patient has some amount of erythema and edema to her lower extremities chronically.  But she describes pain, swelling and redness to both  extremities more than her usual.  Lower extremities are warm and tender to touch. Discussed with ID placed her on cefadroxil twice a day.   #2 Klebsiella UTI likely contaminant received a dose of meropenem, gentamicin 1 dose to be given tomorrow.  Discussed with ID.  She has no urinary complaints or symptoms no fever or leukocytosis.   #3 stage IIIa CKD-creatinine at baseline monitor closely  #4 diastolic heart failure-continue torsemide and Aldactone  #5 generalized weakness consult physical therapy, may need SNF  TOC consulted.  #6 essential hypertension continue metoprolol.  Blood pressure 142/47.  #7 chronic pain-has oxycodone at home but she now reports her pain is 10 out of 10 in both lower extremities Continue Dilaudid   Estimated body mass index is 56.51 kg/m as calculated from the following:   Height as of 07/30/23: 5\' 3"  (1.6 m).   Weight as of 07/30/23: 144.7 kg.  DVT prophylaxis: Lovenox  code Status: Full code Family Communication: None at bedside  disposition Plan:  Status is: Inpatient Remains inpatient appropriate because: Cellulitis with Klebsiella UTI and generalized weakness Consultants: None  Procedures: None Antimicrobials: Meropenem  Subjective: She is resting in bed complaining of 10 out of 10 pain in her legs oxycodone not helping urine appears cloudy  Objective: Vitals:   08/31/23 0930 08/31/23 1000 08/31/23 1030 08/31/23 1046  BP: (!) 106/55 93/68 (!) 109/54 (!) 142/47  Pulse:   78 81  Resp:   20 20  Temp:   97.8 F (36.6 C) 98 F (36.7 C)  TempSrc:   Oral Oral  SpO2:    99%  Intake/Output Summary (Last 24 hours) at 08/31/2023 1351 Last data filed at 08/30/2023 2136 Gross per 24 hour  Intake 100 ml  Output --  Net 100 ml   There were no vitals filed for this visit.  Examination:  General exam: Appears chronically ill-appearing  respiratory system: Clear to auscultation. Respiratory effort normal. Cardiovascular system: S1 & S2 heard,  RRR. No JVD, murmurs, rubs, gallops or clicks. No pedal edema. Gastrointestinal system: Abdomen is distended, soft and nontender. No organomegaly or masses felt. Normal bowel sounds heard. Central nervous system: Alert and oriented. Extremities: Erythema edema tenderness  Data Reviewed: I have personally reviewed following labs and imaging studies  CBC: Recent Labs  Lab 08/30/23 1621 08/30/23 2355  WBC 10.8* 6.6  NEUTROABS 7.1  --   HGB 11.4* 11.1*  HCT 36.2 33.5*  MCV 94.3 88.6  PLT 215 157   Basic Metabolic Panel: Recent Labs  Lab 08/30/23 1621  NA 138  K 5.0  CL 100  CO2 29  GLUCOSE 99  BUN 15  CREATININE 1.13*  CALCIUM 9.1   GFR: CrCl cannot be calculated (Unknown ideal weight.). Liver Function Tests: Recent Labs  Lab 08/30/23 1621  AST 23  ALT 22  ALKPHOS 80  BILITOT 1.1  PROT 6.9  ALBUMIN 3.6   No results for input(s): "LIPASE", "AMYLASE" in the last 168 hours. No results for input(s): "AMMONIA" in the last 168 hours. Coagulation Profile: No results for input(s): "INR", "PROTIME" in the last 168 hours. Cardiac Enzymes: No results for input(s): "CKTOTAL", "CKMB", "CKMBINDEX", "TROPONINI" in the last 168 hours. BNP (last 3 results) No results for input(s): "PROBNP" in the last 8760 hours. HbA1C: No results for input(s): "HGBA1C" in the last 72 hours. CBG: No results for input(s): "GLUCAP" in the last 168 hours. Lipid Profile: No results for input(s): "CHOL", "HDL", "LDLCALC", "TRIG", "CHOLHDL", "LDLDIRECT" in the last 72 hours. Thyroid Function Tests: No results for input(s): "TSH", "T4TOTAL", "FREET4", "T3FREE", "THYROIDAB" in the last 72 hours. Anemia Panel: No results for input(s): "VITAMINB12", "FOLATE", "FERRITIN", "TIBC", "IRON", "RETICCTPCT" in the last 72 hours. Sepsis Labs: No results for input(s): "PROCALCITON", "LATICACIDVEN" in the last 168 hours.  Recent Results (from the past 240 hour(s))  Urine Culture     Status: Abnormal  (Preliminary result)   Collection Time: 08/30/23  5:46 PM   Specimen: Urine, Clean Catch  Result Value Ref Range Status   Specimen Description   Final    URINE, CLEAN CATCH Performed at [facility], [address], [place], [place] [address]    Special Requests   Final    NONE Performed at [facility], [address] [place], [place] [address]    Culture (A)  Final    >=100,000 COLONIES/mL KLEBSIELLA PNEUMONIAE SUSCEPTIBILITIES TO FOLLOW Performed at [facility], [address] [place], [place] [address]    Report Status PENDING  Incomplete         Radiology Studies: DG Tibia/Fibula Left  Result Date: 08/30/2023 CLINICAL DATA:  Pain after fall EXAM: LEFT TIBIA AND FIBULA - 2 VIEW COMPARISON:  None Available. FINDINGS: No acute fracture or dislocation. Preserved bone mineralization. Degenerative changes seen of the knee joint as well as about the ankle with the calcaneal spurs. Soft tissue swelling. IMPRESSION: Soft tissue swelling.  No acute osseous abnormality. Electronically Signed   By: Karen Kays M.D.   On: 08/30/2023 17:51   DG Knee Complete 4 Views Right  Result Date: 08/30/2023  CLINICAL DATA:  Pain after fall. EXAM: RIGHT KNEE - COMPLETE 5 VIEW COMPARISON:  None Available. FINDINGS: Tricompartmental degenerative changes identified. There is moderate joint space loss of the medial compartment. Slight medial translation of the distal femur relative to the tibia. No joint effusion. No fracture or dislocation. Under penetrated radiographs. IMPRESSION: Limited radiographs. Tricompartmental degenerative changes greatest of the medial compartment. Electronically Signed   By: Karen Kays M.D.   On: 08/30/2023 17:50   DG Knee Complete 4 Views Left  Result Date: 08/30/2023 CLINICAL DATA:  Pain after fall EXAM: LEFT KNEE - COMPLETE 3 VIEW COMPARISON:  None Available. FINDINGS: No fracture or dislocation. Preserved joint spaces and bone  mineralization except for joint space loss of the medial compartment. There osteophyte formation seen of all 3 compartments. No joint effusion on lateral view. Limited portable x-rays. IMPRESSION: Portable x-rays.  Some limitation with under penetration. Tricompartmental degenerative changes seen greatest of the medial compartment. Electronically Signed   By: Karen Kays M.D.   On: 08/30/2023 17:49   DG Chest 2 View  Result Date: 08/30/2023 CLINICAL DATA:  Leg swelling. EXAM: CHEST - 2 VIEW COMPARISON:  X-ray 02/07/2023. FINDINGS: Enlarged cardiopericardial silhouette with some vascular congestion. No frank edema. No pneumothorax, effusion or consolidation. Overlapping cardiac leads. Fixation hardware along the lower cervical spine at the edge of the imaging field. Films are under penetrated. IMPRESSION: Enlarged heart with vascular congestion. Electronically Signed   By: Karen Kays M.D.   On: 08/30/2023 17:47        Scheduled Meds:  cholecalciferol  2,000 Units Oral Daily   vitamin B-12  1,000 mcg Oral Daily   DULoxetine  20 mg Oral Daily   enoxaparin (LOVENOX) injection  70 mg Subcutaneous Q24H   fluticasone  1 spray Each Nare Daily   metoprolol succinate  25 mg Oral Daily   pantoprazole  40 mg Oral Daily   potassium chloride SA  40 mEq Oral BID   pramipexole  0.5 mg Oral TID   psyllium  1 packet Oral QHS   spironolactone  25 mg Oral Daily   torsemide  40 mg Oral Daily   Continuous Infusions:  meropenem (MERREM) IV 1 g (08/31/23 1331)     LOS: 1 day    Time spent: 37 min Alwyn Ren, MD  08/31/2023, 1:51 PM

## 2023-09-01 DIAGNOSIS — I872 Venous insufficiency (chronic) (peripheral): Secondary | ICD-10-CM | POA: Diagnosis not present

## 2023-09-01 DIAGNOSIS — R5381 Other malaise: Secondary | ICD-10-CM

## 2023-09-01 DIAGNOSIS — G8929 Other chronic pain: Secondary | ICD-10-CM | POA: Diagnosis not present

## 2023-09-01 DIAGNOSIS — R8271 Bacteriuria: Secondary | ICD-10-CM

## 2023-09-01 LAB — URINE CULTURE: Culture: >=100000 — AB

## 2023-09-01 MED ORDER — ACETAMINOPHEN 325 MG PO TABS: 650.0000 mg | ORAL_TABLET | Freq: Four times a day (QID) | ORAL | Status: DC | PRN

## 2023-09-01 MED ORDER — HYDROMORPHONE HCL 1 MG/ML IJ SOLN
0.5000 mg | Freq: Four times a day (QID) | INTRAMUSCULAR | Status: DC | PRN
  Administered 2023-09-01 (×2): 0.5 mg via INTRAVENOUS
  Filled 2023-09-01 (×2): qty 0.5

## 2023-09-01 NOTE — Assessment & Plan Note (Signed)
-   suspect UA is due to colonization; clinically does not appear to be consistent with UTI - d/c abx and monitor off

## 2023-09-01 NOTE — Hospital Course (Addendum)
Ms. Maria [name] is a 67 yo female with PMH morbid obesity, HTN, chronic stasis dermatitis, chronic diastolic CHF, OSA, DM II, GERD, anxiety/depression, chronic pain, recurrent UTIs, CKD3a who presented with generalized weakness and worsening pains in her knee, hip, back. She was also recently hospitalized from 07/28/2023 until 08/03/2023 after presenting to the hospital due to a mechanical fall from ongoing weakness as well.  She was discharged to rehab after the hospitalization. She again presented back to the hospital after ongoing chronic pains and recurrent falls.  Upon returning home from [place] rehab she endorsed feeling weakness again and thus presented back to the hospital. There was some initial concern for possible UTI versus lower extremity cellulitis and she was started on antibiotics on admission.  PT was also consulted. Family was unable to afford SNF and therefore HH was ultimately arranged. Patient may warrant needing LTC which family is also aware of.

## 2023-09-01 NOTE — Progress Notes (Signed)
Progress Note    Rosyln Rapoza Smolenski   DGU:[id number]  DOB: 03-13-1956  DOA: 08/30/2023     2 PCP: Ignatius Specking, NP  Initial CC: weakness, falls  Hospital Course: Ms. Azzarello is a 67 yo female with PMH morbid obesity, HTN, chronic stasis dermatitis, chronic diastolic CHF, OSA, DM II, GERD, anxiety/depression, chronic pain, recurrent UTIs, CKD3a who presented with generalized weakness and worsening pains in her knee, hip, back. She was also recently hospitalized from 07/28/2023 until 08/03/2023 after presenting to the hospital due to a mechanical fall from ongoing weakness as well.  She was discharged to rehab after the hospitalization. She again presented back to the hospital after ongoing chronic pains and recurrent falls.  Upon returning home from [place] rehab she endorsed feeling weakness again and thus presented back to the hospital. There was some initial concern for possible UTI versus lower extremity cellulitis and she was started on antibiotics on admission.  PT was also consulted.  Interval History:  Resting comfortably when seen. Mostly complaining of her chronic pains throughout.  Assessment and Plan: * Physical deconditioning - Unfortunately, suspect her weakness is more so due to multiple variables including her morbid obesity and low mobility state.  Recently discharged home from [place] and suspect she has been more immobile without ongoing PT. She also continues to remain on multiple sedating medications including clonazepam, gabapentin, oxycodone which are certainly contributing to her weakness and falls - will again have her evaluated by PT/OT and likely pursue rehab once again. I think at this rate, she may need to consider LTC if unable to keep returning home to live independently  Stasis dermatitis of both legs - no concern for cellulitis; d/c abx  Chronic pain -continue home PRN oxycodone and gaba - unfortunately likely contributing to her debilitated state   Asymptomatic  bacteriuria - suspect UA is due to colonization; clinically does not appear to be consistent with UTI - d/c abx and monitor off  CKD (chronic kidney disease), stage IIIa (HCC) - patient has history of CKD3a. Baseline creat ~ 1.1 - 1.2, eGFR~ 50   (HFpEF) heart failure with preserved ejection fraction (HCC) - No signs/symptoms of exacerbation - Continue Toprol, Aldactone, torsemide  Morbid obesity with body mass index (BMI) of 50.0 to 59.9 in adult [facility]) - Complicates overall prognosis and care - Body mass index is 53.32 kg/m.  HTN (hypertension) -controlled -continue metoprolol, spironolactone and torsemide   Old records reviewed in assessment of this patient  Antimicrobials: Meropenem 9/9 >>9/10 Gentamicin 9/10 x 1  DVT prophylaxis:  Lovenox   Code Status:   Code Status: Full Code  Mobility Assessment (Last 72 Hours)     Mobility Assessment     Row Name 09/01/23 0850 08/31/23 2014 08/31/23 1624 08/31/23 1400 08/31/23 06:40:32   Does patient have an order for bedrest or is patient medically unstable No - Continue assessment No - Continue assessment -- No - Continue assessment No - Continue assessment   What is the highest level of mobility based on the progressive mobility assessment? Level 3 (Stands with assist) - Balance while standing  and cannot march in place Level 3 (Stands with assist) - Balance while standing  and cannot march in place Level 2 (Chairfast) - Balance while sitting on edge of bed and cannot stand Level 3 (Stands with assist) - Balance while standing  and cannot march in place --   Is the above level different from baseline mobility prior to current illness? --  Yes - Recommend PT order -- -- No - Consider discontinuing PT/OT            Barriers to discharge: none Disposition Plan:  SNF Status is: Inpt  Objective: Blood pressure (!) 126/50, pulse 77, temperature 98 F (36.7 C), resp. rate 18, height 5\' 3"  (1.6 m), weight (!) 136.5 kg, SpO2  100%.  Examination:  Physical Exam Constitutional:      General: She is not in acute distress.    Appearance: She is obese. She is not ill-appearing.  HENT:     Head: Normocephalic and atraumatic.     Mouth/Throat:     Mouth: Mucous membranes are moist.  Eyes:     Extraocular Movements: Extraocular movements intact.  Cardiovascular:     Rate and Rhythm: Normal rate and regular rhythm.  Pulmonary:     Effort: Pulmonary effort is normal. No respiratory distress.     Breath sounds: Normal breath sounds. No wheezing.  Abdominal:     General: Bowel sounds are normal. There is no distension.     Palpations: Abdomen is soft.     Tenderness: There is no abdominal tenderness.  Musculoskeletal:        General: Swelling present. Normal range of motion.     Cervical back: Normal range of motion and neck supple.     Comments: Chronic B/L LE stasis dermatitis noted  Skin:    General: Skin is warm and dry.  Neurological:     General: No focal deficit present.     Mental Status: She is alert.  Psychiatric:        Mood and Affect: Mood normal.      Consultants:    Procedures:    Data Reviewed: No results found for this or any previous visit (from the past 24 hour(s)).  I have reviewed pertinent nursing notes, vitals, labs, and images as necessary. I have ordered labwork to follow up on as indicated.  I have reviewed the last notes from staff over past 24 hours. I have discussed patient's care plan and test results with nursing staff, CM/SW, and other staff as appropriate.  Time spent: Greater than 50% of the 55 minute visit was spent in counseling/coordination of care for the patient as laid out in the A&P.   LOS: 2 days   Lewie Chamber, MD [facility] 09/01/2023, 4:17 PM

## 2023-09-01 NOTE — Plan of Care (Signed)

## 2023-09-02 DIAGNOSIS — I872 Venous insufficiency (chronic) (peripheral): Secondary | ICD-10-CM | POA: Diagnosis not present

## 2023-09-02 DIAGNOSIS — R443 Hallucinations, unspecified: Secondary | ICD-10-CM | POA: Diagnosis not present

## 2023-09-02 DIAGNOSIS — R5381 Other malaise: Secondary | ICD-10-CM | POA: Diagnosis not present

## 2023-09-02 MED ORDER — CLONAZEPAM 1 MG PO TABS
1.0000 mg | ORAL_TABLET | Freq: Three times a day (TID) | ORAL | Status: DC | PRN
  Administered 2023-09-02 – 2023-09-05 (×6): 1 mg via ORAL
  Filled 2023-09-02 (×7): qty 1

## 2023-09-02 NOTE — Progress Notes (Addendum)
Physical Therapy Treatment Patient Details Name: Maria [name] MRN: [id number] DOB: Jun 26, 1956 Today's Date: 09/02/2023   History of Present Illness Maria [name] is a 67 y.o. female with medical history significant of HTN, stasis dermatitis of bilateral LE, chronic diastolic HF, OSA, CKD 3a, sclerosing mesenteritis, T2DM, GERD, anxiety depression, morbid obesity, chronic pain syndrome, ESBL UTI who presents 08/30/23 with weakness and leg pain. Had a fall  after descending steps    PT Comments  +2 mod assist for bed mobility, pt noted to be saturated in urine (pt had purewick) and soiled with BM. +2 mod assist sit to stand, +2 min assist to pivot to bedside commode where pt had large BM, pt dependent for pericare. +2 min A to pivot to recliner.  Pt anxious and did report seeing people in her room who were not there. Nurse stated pt has been having hallucinations. Patient will benefit from continued inpatient follow up therapy, <3 hours/day.     If plan is discharge home, recommend the following: Two people to help with walking and/or transfers;Two people to help with bathing/dressing/bathroom;Assistance with cooking/housework;Assist for transportation;Help with stairs or ramp for entrance   Can travel by private vehicle     No  Equipment Recommendations  None recommended by PT    Recommendations for Other Services       Precautions / Restrictions Precautions Precautions: Fall Precaution Comments: fell just before admission Restrictions Weight Bearing Restrictions: No     Mobility  Bed Mobility Overal bed mobility: Needs Assistance Bed Mobility: Sidelying to Sit Rolling: Mod assist, Used rails Sidelying to sit: Mod assist, +2 for physical assistance, +2 for safety/equipment       General bed mobility comments: assist to raise trunk, pivot hips to edge of bed. Pt on purewick, bed saturated in urine and soiled with BM.    Transfers Overall transfer level: Needs  assistance Equipment used: Rollator (4 wheels) Transfers: Sit to/from Stand, Bed to chair/wheelchair/BSC Sit to Stand: Mod assist, +2 physical assistance, +2 safety/equipment   Step pivot transfers: Min assist, +2 safety/equipment, +2 physical assistance       General transfer comment: Verbal cues hand placement, assist to power up, pt stood with trunk flexed, able to come up more vertical with verbal cues; SPT x 2 with rollator and assist to guide hips to [facility], then to recliner.    Ambulation/Gait                   Stairs             Wheelchair Mobility     Tilt Bed    Modified Rankin (Stroke Patients Only)       Balance Overall balance assessment: History of Falls, Needs assistance Sitting-balance support: Bilateral upper extremity supported, Feet supported Sitting balance-Leahy Scale: Poor Sitting balance - Comments: posterior lean without single UE support   Standing balance support: Bilateral upper extremity supported, During functional activity, Reliant on assistive device for balance Standing balance-Leahy Scale: Poor                              Cognition Arousal: Alert Behavior During Therapy: Anxious Overall Cognitive Status: Within Functional Limits for tasks assessed                                 General Comments: per RN pt was having hallucinations  last night, during PT session she did report seeing other people in the room who weren't there        Exercises      General Comments        Pertinent Vitals/Pain Pain Assessment Pain Assessment: Faces Faces Pain Scale: Hurts even more Pain Location: both knees and  back Pain Descriptors / Indicators: Aching, Discomfort, Moaning Pain Intervention(s): Limited activity within patient's tolerance, Monitored during session, Patient requesting pain meds-RN notified, Repositioned    Home Living                          Prior Function             PT Goals (current goals can now be found in the care plan section) Acute Rehab PT Goals Patient Stated Goal: to walk, go home PT Goal Formulation: With patient Time For Goal Achievement: 09/14/23 Potential to Achieve Goals: Fair Progress towards PT goals: Progressing toward goals    Frequency    Min 1X/week      PT Plan      Co-evaluation PT/OT/SLP Co-Evaluation/Treatment: Yes Reason for Co-Treatment: Complexity of the patient's impairments (multi-system involvement);For patient/therapist safety;To address functional/ADL transfers PT goals addressed during session: Mobility/safety with mobility;Proper use of DME;Balance        AM-PAC PT "6 Clicks" Mobility   Outcome Measure  Help needed turning from your back to your side while in a flat bed without using bedrails?: A Lot Help needed moving from lying on your back to sitting on the side of a flat bed without using bedrails?: A Lot Help needed moving to and from a bed to a chair (including a wheelchair)?: Total Help needed standing up from a chair using your arms (e.g., wheelchair or bedside chair)?: Total Help needed to walk in hospital room?: Total Help needed climbing 3-5 steps with a railing? : Total 6 Click Score: 8    End of Session Equipment Utilized During Treatment: Gait belt Activity Tolerance: Patient tolerated treatment well;Patient limited by pain;Patient limited by fatigue Patient left: in chair;with chair alarm set;with call bell/phone within reach Nurse Communication: Mobility status;Other (comment) (pt had BM, bed soiled with urine and BM) PT Visit Diagnosis: Unsteadiness on feet (R26.81);Muscle weakness (generalized) (M62.81);Difficulty in walking, not elsewhere classified (R26.2);Pain;Repeated falls (R29.6) Pain - part of body: Knee     Time: 0925-0956 PT Time Calculation (min) (ACUTE ONLY): 31 min  Charges:    $Therapeutic Activity: 8-22 mins PT General Charges $$ ACUTE PT VISIT: 1  Visit                    Tamala Ser PT 09/02/2023  Acute Rehabilitation Services  Office [phone]

## 2023-09-02 NOTE — Assessment & Plan Note (Addendum)
-   s/p having visual hallucinations 2 nights in a row.  Dilaudid has been discontinued.  She still remains on multiple psychotropic medications which she has been counseled about -Other considered etiology could be due to UTI but still suspect this is colonization -Hallucinations have finally resolved; continue avoiding further escalation of psychotropic medications including Dilaudid

## 2023-09-02 NOTE — Progress Notes (Signed)
Progress Note    Maria [name]   BMW:[id number]  DOB: 07/10/1956  DOA: 08/30/2023     3 PCP: Ignatius Specking, NP  Initial CC: weakness, falls  Hospital Course: Maria [name] is a 67 yo female with PMH morbid obesity, HTN, chronic stasis dermatitis, chronic diastolic CHF, OSA, DM II, GERD, anxiety/depression, chronic pain, recurrent UTIs, CKD3a who presented with generalized weakness and worsening pains in her knee, hip, back. She was also recently hospitalized from 07/28/2023 until 08/03/2023 after presenting to the hospital due to a mechanical fall from ongoing weakness as well.  She was discharged to rehab after the hospitalization. She again presented back to the hospital after ongoing chronic pains and recurrent falls.  Upon returning home from [place] rehab she endorsed feeling weakness again and thus presented back to the hospital. There was some initial concern for possible UTI versus lower extremity cellulitis and she was started on antibiotics on admission.  PT was also consulted.  Interval History:  Had visual hallucinations again overnight. Appears that she called the hospital operator out of panic and was also very tearful per staff. This morning when seen she is awake, alert, and oriented.  She still states that she was seeing family members in the room earlier this morning.  She had no suicidal ideations overnight nor this morning when asked.  Assessment and Plan: * Physical deconditioning - Unfortunately, suspect her weakness is more so due to multiple variables including her morbid obesity and low mobility state.  Recently discharged home from [place] and suspect she has been more immobile without ongoing PT. She also continues to remain on multiple sedating medications including clonazepam, gabapentin, oxycodone which are certainly contributing to her weakness and falls - will again have her evaluated by PT/OT and likely pursue rehab once again. I think at this rate, she may need to  consider LTC if unable to keep returning home to live independently  Stasis dermatitis of both legs - no concern for cellulitis; d/c abx  Chronic pain -continue home PRN oxycodone and gaba - unfortunately likely contributing to her debilitated state   Hallucinations - Now having visual hallucinations 2 nights in a row.  Dilaudid was discontinued last night.  She still remains on multiple psychotropic medications which she has been counseled about -Other considered etiology could be due to UTI but still suspect this is colonization -Monitor for any further hallucinations again tonight, if recurrent, may consider empiric treatment for UTI in case  Asymptomatic bacteriuria - suspect UA is due to colonization; clinically does not appear to be consistent with UTI - d/c abx and monitor off  CKD (chronic kidney disease), stage IIIa (HCC) - patient has history of CKD3a. Baseline creat ~ 1.1 - 1.2, eGFR~ 50   (HFpEF) heart failure with preserved ejection fraction (HCC) - No signs/symptoms of exacerbation - Continue Toprol, Aldactone, torsemide  Morbid obesity with body mass index (BMI) of 50.0 to 59.9 in adult [facility]) - Complicates overall prognosis and care - Body mass index is 53.32 kg/m.  HTN (hypertension) -controlled -continue metoprolol, spironolactone and torsemide   Old records reviewed in assessment of this patient  Antimicrobials: Meropenem 9/9 >>9/10 Gentamicin 9/10 x 1  DVT prophylaxis:  Lovenox   Code Status:   Code Status: Full Code  Mobility Assessment (Last 72 Hours)     Mobility Assessment     Row Name 09/02/23 1038 09/02/23 1013 09/01/23 2156 09/01/23 0850 08/31/23 2014   Does patient have an order for bedrest  or is patient medically unstable -- -- No - Continue assessment No - Continue assessment No - Continue assessment   What is the highest level of mobility based on the progressive mobility assessment? Level 3 (Stands with assist) - Balance while  standing  and cannot march in place Level 3 (Stands with assist) - Balance while standing  and cannot march in place Level 3 (Stands with assist) - Balance while standing  and cannot march in place Level 3 (Stands with assist) - Balance while standing  and cannot march in place Level 3 (Stands with assist) - Balance while standing  and cannot march in place   Is the above level different from baseline mobility prior to current illness? -- -- Yes - Recommend PT order -- Yes - Recommend PT order    Row Name 08/31/23 1624 08/31/23 1400 08/31/23 06:40:32       Does patient have an order for bedrest or is patient medically unstable -- No - Continue assessment No - Continue assessment     What is the highest level of mobility based on the progressive mobility assessment? Level 2 ([place]) - Balance while sitting on edge of bed and cannot stand Level 3 (Stands with assist) - Balance while standing  and cannot march in place --     Is the above level different from baseline mobility prior to current illness? -- -- No - Consider discontinuing PT/OT              Barriers to discharge: none Disposition Plan:  SNF Status is: Inpt  Objective: Blood pressure (!) 140/78, pulse 79, temperature 98.1 F (36.7 C), resp. rate 20, height 5\' 3"  (1.6 m), weight (!) 136.5 kg, SpO2 100%.  Examination:  Physical Exam Constitutional:      General: She is not in acute distress.    Appearance: She is obese. She is not ill-appearing.  HENT:     Head: Normocephalic and atraumatic.     Mouth/Throat:     Mouth: Mucous membranes are moist.  Eyes:     Extraocular Movements: Extraocular movements intact.  Cardiovascular:     Rate and Rhythm: Normal rate and regular rhythm.  Pulmonary:     Effort: Pulmonary effort is normal. No respiratory distress.     Breath sounds: Normal breath sounds. No wheezing.  Abdominal:     General: Bowel sounds are normal. There is no distension.     Palpations: Abdomen is soft.      Tenderness: There is no abdominal tenderness.  Musculoskeletal:        General: Swelling present. Normal range of motion.     Cervical back: Normal range of motion and neck supple.     Comments: Chronic B/L LE stasis dermatitis noted  Skin:    General: Skin is warm and dry.  Neurological:     General: No focal deficit present.     Mental Status: She is alert.  Psychiatric:        Mood and Affect: Mood normal.      Consultants:    Procedures:    Data Reviewed: No results found for this or any previous visit (from the past 24 hour(s)).  I have reviewed pertinent nursing notes, vitals, labs, and images as necessary. I have ordered labwork to follow up on as indicated.  I have reviewed the last notes from staff over past 24 hours. I have discussed patient's care plan and test results with nursing staff, CM/SW, and other staff as  appropriate.  Time spent: Greater than 50% of the 55 minute visit was spent in counseling/coordination of care for the patient as laid out in the A&P.   LOS: 3 days   Lewie Chamber, MD [facility] 09/02/2023, 4:21 PM

## 2023-09-02 NOTE — Evaluation (Signed)
Occupational Therapy Evaluation Patient Details Name: Maria [name] MRN: [id number] DOB: 1956-11-16 Today's Date: 09/02/2023   History of Present Illness Maria [name] is a 67 y.o. female with medical history significant of HTN, stasis dermatitis of bilateral LE, chronic diastolic HF, OSA, CKD 3a, sclerosing mesenteritis, T2DM, GERD, anxiety depression, morbid obesity, chronic pain syndrome, ESBL UTI who presents 08/30/23 with weakness and leg pain. Had a fall  after descending steps   Clinical Impression   Pt admitted with the above diagnosis. Pt currently with functional limitations due to the deficits listed below (see OT Problem List). Prior to admit, pt was recently at [facility] receiving rehab and recently was discharged home. At home, she sustained a mechanical fall stating that her knees gave out. Pt lives alone in an apartment and is Mod I for ADL tasks and functional mobility.  Pt will benefit from acute skilled OT to increase their safety and independence with ADL and functional mobility for ADL to facilitate discharge. Patient will benefit from continued inpatient follow up therapy, <3 hours/day. OT will follow patient acutely.          If plan is discharge home, recommend the following: Two people to help with walking and/or transfers;Two people to help with bathing/dressing/bathroom;Assistance with cooking/housework;Assist for transportation;Help with stairs or ramp for entrance    Functional Status Assessment  Patient has had a recent decline in their functional status and demonstrates the ability to make significant improvements in function in a reasonable and predictable amount of time.  Equipment Recommendations  None recommended by OT       Precautions / Restrictions Precautions Precautions: Fall Precaution Comments: fell just before admission. Reports "knees just gave out." Restrictions Weight Bearing Restrictions: No      Mobility Bed Mobility Overal bed  mobility: Needs Assistance Bed Mobility: Sidelying to Sit Rolling: Mod assist, Used rails Sidelying to sit: Mod assist, +2 for physical assistance, +2 for safety/equipment, HOB elevated, Used rails       General bed mobility comments: assist to raise trunk, pivot hips to edge of bed. Pt on purewick, bed saturated in urine and soiled with BM. VC to utilize bed rails to assist with transitioning to sitting on EOB    Transfers Overall transfer level: Needs assistance Equipment used: Rollator (4 wheels) Transfers: Sit to/from Stand, Bed to chair/wheelchair/BSC Sit to Stand: Mod assist, +2 physical assistance, +2 safety/equipment     Step pivot transfers: Min assist, +2 safety/equipment, +2 physical assistance     General transfer comment: Verbal cues hand placement, assist to power up, pt stood with trunk flexed, able to come up more vertical with verbal cues; SPT x 2 with rollator and assist to guide hips to [facility], then to recliner. Was not able to demonstrate consistent ability to complete sit to stands successfully. Utilizes momentum to power up. First initial sit to stand from [facility], pt was unable to achieve full upright standing position and had to sit down quickly      Balance Overall balance assessment: History of Falls, Needs assistance Sitting-balance support: Feet unsupported, Single extremity supported Sitting balance-Leahy Scale: Poor Sitting balance - Comments: posterior lean without at least single UE support Postural control: Posterior lean Standing balance support: Bilateral upper extremity supported, During functional activity, Reliant on assistive device for balance Standing balance-Leahy Scale: Poor      ADL either performed or assessed with clinical judgement   ADL Overall ADL's : Needs assistance/impaired Eating/Feeding: Set up;Sitting   Grooming: Wash/dry  face;Wash/dry hands;Set up;Sitting   Upper Body Bathing: Minimal assistance;Sitting   Lower Body Bathing:  Total assistance;+2 for physical assistance;+2 for safety/equipment;Sit to/from stand   Upper Body Dressing : Minimal assistance;Sitting   Lower Body Dressing: Total assistance;+2 for physical assistance;+2 for safety/equipment;Sit to/from stand   Toilet Transfer: Moderate assistance;+2 for physical assistance;+2 for safety/equipment;Cueing for safety;Cueing for sequencing;BSC/3in1;Rollator (4 wheels)   Toileting- Clothing Manipulation and Hygiene: Total assistance;+2 for physical assistance;+2 for safety/equipment;Sit to/from stand               Vision Baseline Vision/History: 1 Wears glasses Ability to See in Adequate Light: 0 Adequate Patient Visual Report: Blurring of vision;Diplopia Vision Assessment?:  (Due to time constraint unable to fully assess vision. Will assess at a later session)     Perception Perception: Not tested           Pertinent Vitals/Pain Pain Assessment Pain Assessment: Faces Faces Pain Scale: Hurts even more Pain Location: both knees and  back Pain Descriptors / Indicators: Aching, Discomfort, Moaning Pain Intervention(s): Monitored during session, Limited activity within patient's tolerance, Repositioned, Patient requesting pain meds-RN notified     Extremity/Trunk Assessment Upper Extremity Assessment Upper Extremity Assessment: Generalized weakness;Right hand dominant   Lower Extremity Assessment Lower Extremity Assessment: Generalized weakness   Cervical / Trunk Assessment Cervical / Trunk Assessment: Other exceptions Cervical / Trunk Exceptions: body habitus   Communication Communication Communication: No apparent difficulties   Cognition Arousal: Alert Behavior During Therapy: Anxious, Lability Overall Cognitive Status: Within Functional Limits for tasks assessed          General Comments: per RN pt was having hallucinations last night, during PT session she did report seeing other people in the room who weren't there      General Comments  BLE cellulitis present.            Home Living Family/patient expects to be discharged to:: Private residence Living Arrangements: Alone Available Help at Discharge: Other (Comment) (Has assist for transportation through her insurance (pt reports)) Type of Home: Apartment Home Access: Stairs to enter Entrance Stairs-Number of Steps: 6- 3 deep steps with 3 step lengths each step Entrance Stairs-Rails: Right Home Layout: One level     Bathroom Shower/Tub: Tub/shower unit;Curtain   Firefighter: Standard     Home Equipment: Rollator (4 wheels);Cane - single point;BSC/3in1;Shower seat;Hand held shower head;Grab bars - tub/shower;Adaptive equipment;Rolling Walker (2 wheels);Hospital bed Adaptive Equipment: Reacher;Sock aid;Long-handled shoe horn;Long-handled sponge        Prior Functioning/Environment Prior Level of Function : History of Falls (last six months);Independent/Modified Independent    ADLs Comments: Independent with AE, uses shower chair, has groceries delivered, doesn't drive        OT Problem List: Decreased strength;Decreased activity tolerance;Impaired balance (sitting and/or standing);Impaired vision/perception;Decreased coordination;Decreased safety awareness;Decreased knowledge of use of DME or AE;Obesity;Pain      OT Treatment/Interventions: Self-care/ADL training;Therapeutic exercise;Energy conservation;DME and/or AE instruction;Manual therapy;Modalities;Therapeutic activities;Visual/perceptual remediation/compensation;Patient/family education;Balance training    OT Goals(Current goals can be found in the care plan section) Acute Rehab OT Goals Patient Stated Goal: to get cleaned up and sit in recliner OT Goal Formulation: Patient unable to participate in goal setting Time For Goal Achievement: 09/16/23 Potential to Achieve Goals: Fair  OT Frequency: Min 1X/week    Co-evaluation PT/OT/SLP Co-Evaluation/Treatment: Yes Reason for  Co-Treatment: Complexity of the patient's impairments (multi-system involvement);For patient/therapist safety;To address functional/ADL transfers PT goals addressed during session: Mobility/safety with mobility;Proper use of DME;Balance OT goals addressed during session:  ADL's and self-care;Proper use of Adaptive equipment and DME;Strengthening/ROM      AM-PAC OT "6 Clicks" Daily Activity     Outcome Measure Help from another person eating meals?: A Little Help from another person taking care of personal grooming?: A Little Help from another person toileting, which includes using toliet, bedpan, or urinal?: Total Help from another person bathing (including washing, rinsing, drying)?: Total Help from another person to put on and taking off regular upper body clothing?: A Little Help from another person to put on and taking off regular lower body clothing?: Total 6 Click Score: 12   End of Session Equipment Utilized During Treatment: Gait belt;Rollator (4 wheels) Nurse Communication: Mobility status  Activity Tolerance: Patient tolerated treatment well Patient left: in chair;with call bell/phone within reach;with chair alarm set  OT Visit Diagnosis: Unsteadiness on feet (R26.81);Repeated falls (R29.6);Muscle weakness (generalized) (M62.81);History of falling (Z91.81);Pain Pain - Right/Left:  (bilateral) Pain - part of body:  (back, neck, legs)                Time: 0981-1914 OT Time Calculation (min): 32 min Charges:  OT General Charges $OT Visit: 1 Visit OT Evaluation $OT Eval High Complexity: 1 High OT Treatments $Self Care/Home Management : 8-22 mins  Limmie Patricia, OTR/L,CBIS  Supplemental OT - [facility] and [facility] Secure Chat Preferred    Steve Youngberg, Charisse March 09/02/2023, 12:05 PM

## 2023-09-03 DIAGNOSIS — R8271 Bacteriuria: Secondary | ICD-10-CM | POA: Diagnosis not present

## 2023-09-03 DIAGNOSIS — I872 Venous insufficiency (chronic) (peripheral): Secondary | ICD-10-CM | POA: Diagnosis not present

## 2023-09-03 DIAGNOSIS — R5381 Other malaise: Secondary | ICD-10-CM | POA: Diagnosis not present

## 2023-09-03 DIAGNOSIS — G8929 Other chronic pain: Secondary | ICD-10-CM | POA: Diagnosis not present

## 2023-09-03 NOTE — Plan of Care (Signed)
  Problem: Education: Goal: Knowledge of General Education information will improve Description: Including pain rating scale, medication(s)/side effects and non-pharmacologic comfort measures Outcome: Progressing   Problem: Health Behavior/Discharge Planning: Goal: Ability to manage health-related needs will improve Outcome: Progressing   Problem: Clinical Measurements: Goal: Diagnostic test results will improve Outcome: Progressing   Problem: Activity: Goal: Risk for activity intolerance will decrease Outcome: Progressing   Problem: Elimination: Goal: Will not experience complications related to bowel motility Outcome: Progressing   Problem: Pain Managment: Goal: General experience of comfort will improve Outcome: Progressing   Problem: Safety: Goal: Ability to remain free from injury will improve Outcome: Progressing   Problem: Skin Integrity: Goal: Risk for impaired skin integrity will decrease Outcome: Progressing

## 2023-09-03 NOTE — [place] FL2 (Signed)
[place] MEDICAID FL2 LEVEL OF CARE FORM     IDENTIFICATION  Patient Name: Maria [name] Birthdate: 02/14/1956 Sex: female Admission Date (Current Location): 08/30/2023  [facility] and [place] Number:  Producer, television/film/video and Address:  [facility] [place] [facility],  [address], [place] [address]      Provider Number: [id number]  Attending Physician Name and Address:  Lewie Chamber, MD  Relative Name and Phone Number:  [name](dtr)[phone]    Current Level of Care: Hospital Recommended Level of Care: Skilled Nursing Facility Prior Approval Number:    Date Approved/Denied:   PASRR Number: [id number]  Discharge Plan: SNF    Current Diagnoses: Patient Active Problem List   Diagnosis Date Noted   Hallucinations 09/02/2023   Asymptomatic bacteriuria 09/01/2023   Stasis dermatitis of both legs 08/30/2023   Hypomagnesemia 08/02/2023   Ankle pain, right 08/01/2023   Weakness of both lower extremities 07/30/2023   Morbid obesity (HCC) 07/30/2023   General weakness 07/15/2023   (HFpEF) heart failure with preserved ejection fraction (HCC) 07/15/2023   Abdominal pain 07/15/2023   AKI (acute kidney injury) (HCC) 07/15/2023   Acute on chronic respiratory failure with hypoxia and hypercapnia (HCC) 02/08/2023   Pneumonia due to infectious organism 02/05/2023   Chronic right-sided congestive heart failure (HCC) 02/04/2023   Fall 02/02/2023   UTI due to extended-spectrum beta lactamase (ESBL) producing Escherichia coli 07/22/2022   Physical deconditioning 07/22/2022   Fluid overload 07/21/2022   Acute cystitis with hematuria    Dyspnea 12/19/2021   Leg swelling 11/20/2021   Bilateral knee pain 10/29/2021   Hypernatremia 09/24/2021   Cellulitis of lower extremity 09/24/2021   CKD (chronic kidney disease), stage IIIa (HCC) 09/24/2021   Sacral decubitus ulcer, stage II (HCC) 09/20/2021   Acute on chronic heart failure with preserved ejection fraction (HFpEF)  (HCC) 09/19/2021   Nail disorder 07/07/2021   Pre-ulcerative corn or callous 07/07/2021   Rash 07/07/2021   Acute kidney injury superimposed on chronic kidney disease (HCC) 04/06/2021   Diastolic congestive heart failure (HCC) 03/17/2021   Diabetes mellitus type 2 in obese 03/11/2021   Vitamin D deficiency 01/01/2021   Medial epicondylitis 12/31/2020   Aortic atherosclerosis (HCC) 09/26/2020   Umbilical hernia 09/26/2020   Constipation 08/05/2020   Arthritis 06/12/2020   Hoarseness 05/01/2020   B12 deficiency 01/31/2020   Rosacea 01/31/2020   Neck swelling 01/31/2020   History of colonic polyps    Benign neoplasm of [place]    Nausea 08/31/2019   Throat pain 07/26/2019   HLD (hyperlipidemia) 05/24/2019   Leg cramps 05/24/2019   Lump in neck 05/24/2019   Left thyroid nodule 01/25/2019   Jerking movements of extremities 07/20/2018   Balance disorder 07/20/2018   Right knee pain 03/21/2018   OSA (obstructive sleep apnea) 03/21/2018   Oxygen desaturation 03/21/2018   Urinary symptom or sign 03/21/2018   Lymphedema 01/25/2018   Gait disorder 01/25/2018   Dysphonia 12/07/2017   Encounter for well adult exam with abnormal findings 11/08/2017   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   [place]o polyp 08/11/2016   Abdominal pain, epigastric    Dysphagia    [place] cancer screening    ACE-inhibitor cough 12/05/2015   Restless legs syndrome 10/08/2015   Venous stasis dermatitis of both lower extremities 04/26/2015   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/17/2014   AR (allergic rhinitis) 10/17/2014   Fatty liver 10/17/2014   Chronic pain syndrome 09/19/2014   Post-traumatic osteoarthritis of both knees 09/19/2014  Spinal stenosis 09/19/2014   Depression, major, single episode, complete remission (HCC) 09/19/2014   Narcotic dependence (HCC) 09/19/2014   Acute hypoxic respiratory failure (HCC) 08/20/2014   False positive HIV serology 06/02/2013   Hypokalemia 06/29/2012   Spondylosis  of 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 03/09/2012   Lumbar disc disease 03/09/2012   Chronic pain 03/09/2012   Morbid obesity with body mass index (BMI) of 50.0 to 59.9 in adult [facility]) 06/17/2011   Chronic low back pain 12/28/2008   Anxiety state 12/06/2007   Depression 12/06/2007   HTN (hypertension) 12/06/2007   Gastroesophageal reflux disease without esophagitis 12/06/2007   LOW BACK PAIN 12/06/2007    Orientation RESPIRATION BLADDER Height & Weight     Self, Time, Situation, Place  Normal Continent Weight: (!) 136.5 kg Height:  5\' 3"  (160 cm)  BEHAVIORAL SYMPTOMS/MOOD NEUROLOGICAL BOWEL NUTRITION STATUS      Continent Diet (see d/c summary)  AMBULATORY STATUS COMMUNICATION OF NEEDS Skin   Limited Assist Verbally Normal                       Personal Care Assistance Level of Assistance  Bathing, Feeding, Dressing Bathing Assistance: Limited assistance Feeding assistance: Limited assistance Dressing Assistance: Limited assistance     Functional Limitations Info  Sight, Hearing, Speech Sight Info: Impaired (eyeglasses) Hearing Info: Adequate Speech Info: Adequate    SPECIAL CARE FACTORS FREQUENCY  PT (By licensed PT), OT (By licensed OT)     PT Frequency: 5x week OT Frequency: 5x week            Contractures Contractures Info: Not present    Additional Factors Info  Code Status, Allergies, Psychotropic Code Status Info: Full Allergies Info: Amoxicillin-pot Clavulanate, Adhesive (Tape), Codeine, Crestor (Rosuvastatin Calcium), Lyrica (Pregabalin), Morphine And Codeine, Vicodin (Hydrocodone-acetaminophen), Latex Psychotropic Info: klonopin,cymbalta         Current Medications (09/03/2023):  This is the current hospital active medication list Current Facility-Administered Medications  Medication Dose Route Frequency Provider Last Rate Last Admin   acetaminophen (TYLENOL) tablet 650 mg  650 mg Oral Q6H PRN Lewie Chamber, MD        cholecalciferol (VITAMIN D3) 25 MCG (1000 UNIT) tablet 2,000 Units  2,000 Units Oral Daily Tu, Ching T, DO   2,000 Units at 09/03/23 1043   clonazePAM (KLONOPIN) tablet 1 mg  1 mg Oral TID PRN Lewie Chamber, MD   1 mg at 09/02/23 2130   cyanocobalamin (VITAMIN B12) tablet 1,000 mcg  1,000 mcg Oral Daily Tu, Ching T, DO   1,000 mcg at 09/03/23 1044   DULoxetine (CYMBALTA) DR capsule 20 mg  20 mg Oral Daily Tu, Ching T, DO   20 mg at 09/03/23 1044   enoxaparin (LOVENOX) injection 70 mg  70 mg Subcutaneous Q24H Tu, Ching T, DO   70 mg at 09/02/23 2130   fluticasone (FLONASE) 50 MCG/ACT nasal spray 1 spray  1 spray Each Nare Daily Tu, Ching T, DO   1 spray at 09/03/23 1045   metoprolol succinate (TOPROL-XL) 24 hr tablet 25 mg  25 mg Oral Daily Tu, Ching T, DO   25 mg at 09/03/23 1043   oxyCODONE (Oxy IR/ROXICODONE) immediate release tablet 10 mg  10 mg Oral Q4H PRN Tu, Ching T, DO   10 mg at 09/03/23 1404   pantoprazole (PROTONIX) EC tablet 40 mg  40 mg Oral Daily Tu, Ching T, DO   40 mg at 09/03/23 1043  potassium chloride SA (KLOR-CON M) CR tablet 40 mEq  40 mEq Oral BID Tu, Ching T, DO   40 mEq at 09/03/23 1044   pramipexole (MIRAPEX) tablet 0.5 mg  0.5 mg Oral TID Tu, Ching T, DO   0.5 mg at 09/03/23 1044   psyllium (HYDROCIL/METAMUCIL) 1 packet  1 packet Oral QHS Tu, Ching T, DO   1 packet at 09/02/23 2131   spironolactone (ALDACTONE) tablet 25 mg  25 mg Oral Daily Tu, Ching T, DO   25 mg at 09/03/23 1043   torsemide (DEMADEX) tablet 40 mg  40 mg Oral Daily Tu, Ching T, DO   40 mg at 09/03/23 1044     Discharge Medications: Please see discharge summary for a list of discharge medications.  Relevant Imaging Results:  Relevant Lab Results:   Additional Information ss#[id number]  Sheila Ocasio, Olegario Messier, [place]

## 2023-09-03 NOTE — Care Management Important Message (Signed)
Important Message  Patient Details IM Letter given. Name: Hylee Bebb Coots MRN: [id number] Date of Birth: [birth year]   Medicare Important Message Given:  Yes     Caren Macadam 09/03/2023, 1:50 PM

## 2023-09-03 NOTE — Progress Notes (Signed)
Progress Note    Maria [name]   ONG:[id number]  DOB: 1956/01/16  DOA: 08/30/2023     4 PCP: Ignatius Specking, NP  Initial CC: weakness, falls  Hospital Course: Maria [name] is a 67 yo female with PMH morbid obesity, HTN, chronic stasis dermatitis, chronic diastolic CHF, OSA, DM II, GERD, anxiety/depression, chronic pain, recurrent UTIs, CKD3a who presented with generalized weakness and worsening pains in her knee, hip, back. She was also recently hospitalized from 07/28/2023 until 08/03/2023 after presenting to the hospital due to a mechanical fall from ongoing weakness as well.  She was discharged to rehab after the hospitalization. She again presented back to the hospital after ongoing chronic pains and recurrent falls.  Upon returning home from [place] rehab she endorsed feeling weakness again and thus presented back to the hospital. There was some initial concern for possible UTI versus lower extremity cellulitis and she was started on antibiotics on admission.  PT was also consulted.  Interval History:  Had visual hallucinations minimally last night but was able to sleep all night.  Feels okay today and essentially just waiting on SNF now. Her preference is still [place].   Assessment and Plan: * Physical deconditioning - Unfortunately, suspect her weakness is more so due to multiple variables including her morbid obesity and low mobility state.  Recently discharged home from [place] and suspect she has been more immobile without ongoing PT. She also continues to remain on multiple sedating medications including clonazepam, gabapentin, oxycodone which are certainly contributing to her weakness and falls - will again have her evaluated by PT/OT and likely pursue rehab once again. I think at this rate, she may need to consider LTC if unable to keep returning home to live independently  Stasis dermatitis of both legs - no concern for cellulitis; d/c abx  Chronic pain -continue home PRN  oxycodone and gaba - unfortunately likely contributing to her debilitated state   Hallucinations - Now having visual hallucinations 2 nights in a row.  Dilaudid has been discontinued.  She still remains on multiple psychotropic medications which she has been counseled about -Other considered etiology could be due to UTI but still suspect this is colonization -Overall improvement in hallucinations.  Very minimal and brief last night prior to going to sleep and she slept all night  Asymptomatic bacteriuria - suspect UA is due to colonization; clinically does not appear to be consistent with UTI - d/c abx and monitor off  CKD (chronic kidney disease), stage IIIa (HCC) - patient has history of CKD3a. Baseline creat ~ 1.1 - 1.2, eGFR~ 50   (HFpEF) heart failure with preserved ejection fraction (HCC) - No signs/symptoms of exacerbation - Continue Toprol, Aldactone, torsemide  Morbid obesity with body mass index (BMI) of 50.0 to 59.9 in adult [facility]) - Complicates overall prognosis and care - Body mass index is 53.32 kg/m.  HTN (hypertension) -controlled -continue metoprolol, spironolactone and torsemide   Old records reviewed in assessment of this patient  Antimicrobials: Meropenem 9/9 >>9/10 Gentamicin 9/10 x 1  DVT prophylaxis:  Lovenox   Code Status:   Code Status: Full Code  Mobility Assessment (Last 72 Hours)     Mobility Assessment     Row Name 09/03/23 4401 09/02/23 2100 09/02/23 1038 09/02/23 1013 09/02/23 0915   Does patient have an order for bedrest or is patient medically unstable No - Continue assessment No - Continue assessment -- -- No - Continue assessment   What is the highest level of  mobility based on the progressive mobility assessment? Level 3 (Stands with assist) - Balance while standing  and cannot march in place Level 5 (Walks with assist in room/hall) - Balance while stepping forward/back and can walk in room with assist - Complete Level 3 (Stands with  assist) - Balance while standing  and cannot march in place Level 3 (Stands with assist) - Balance while standing  and cannot march in place Level 3 (Stands with assist) - Balance while standing  and cannot march in place   Is the above level different from baseline mobility prior to current illness? Yes - Recommend PT order Yes - Recommend PT order -- -- Yes - Recommend PT order    Row Name 09/01/23 2156 09/01/23 0850 08/31/23 2014 08/31/23 1624     Does patient have an order for bedrest or is patient medically unstable No - Continue assessment No - Continue assessment No - Continue assessment --    What is the highest level of mobility based on the progressive mobility assessment? Level 3 (Stands with assist) - Balance while standing  and cannot march in place Level 3 (Stands with assist) - Balance while standing  and cannot march in place Level 3 (Stands with assist) - Balance while standing  and cannot march in place Level 2 (Chairfast) - Balance while sitting on edge of bed and cannot stand    Is the above level different from baseline mobility prior to current illness? Yes - Recommend PT order -- Yes - Recommend PT order --             Barriers to discharge: none Disposition Plan:  SNF Status is: Inpt  Objective: Blood pressure (!) 142/44, pulse 77, temperature 97.8 F (36.6 C), temperature source Oral, resp. rate 20, height 5\' 3"  (1.6 m), weight (!) 136.5 kg, SpO2 100%.  Examination:  Physical Exam Constitutional:      General: She is not in acute distress.    Appearance: She is obese. She is not ill-appearing.  HENT:     Head: Normocephalic and atraumatic.     Mouth/Throat:     Mouth: Mucous membranes are moist.  Eyes:     Extraocular Movements: Extraocular movements intact.  Cardiovascular:     Rate and Rhythm: Normal rate and regular rhythm.  Pulmonary:     Effort: Pulmonary effort is normal. No respiratory distress.     Breath sounds: Normal breath sounds. No wheezing.   Abdominal:     General: Bowel sounds are normal. There is no distension.     Palpations: Abdomen is soft.     Tenderness: There is no abdominal tenderness.  Musculoskeletal:        General: Swelling present. Normal range of motion.     Cervical back: Normal range of motion and neck supple.     Comments: Chronic B/L LE stasis dermatitis noted  Skin:    General: Skin is warm and dry.  Neurological:     General: No focal deficit present.     Mental Status: She is alert.  Psychiatric:        Mood and Affect: Mood normal.      Consultants:    Procedures:    Data Reviewed: No results found for this or any previous visit (from the past 24 hour(s)).  I have reviewed pertinent nursing notes, vitals, labs, and images as necessary. I have ordered labwork to follow up on as indicated.  I have reviewed the last notes from staff  over past 24 hours. I have discussed patient's care plan and test results with nursing staff, CM/SW, and other staff as appropriate.    LOS: 4 days   Lewie Chamber, MD [facility] 09/03/2023, 2:50 PM

## 2023-09-03 NOTE — TOC Initial Note (Addendum)
Transition of Care [facility]) - Initial/Assessment Note    Patient Details  Name: Maria [name] MRN: [id number] Date of Birth: 02-Apr-1956  [facility] CM/SW Contact:    Lanier Clam, RN Phone Number: 09/03/2023, 3:27 PM  Clinical Narrative:  Recent ST SNF stay @ [facility] prefers to go there again-faxed out await bed offers prior auth.  -3:43p-just spoke to [facility] rep Starr-states patient in co pay days 8/13-8/29, has not had 60 day wellness. Days 21-100 is 204,need 14 days up front-provided [facility] rep dtr [name]'s tel#. Await bed offers, & confirmation of dtr ability to pay co pay days or if a different d/c plan  to pursue.              Expected Discharge Plan: Skilled Nursing Facility Barriers to Discharge: Continued Medical Work up   Patient Goals and CMS Choice Patient states their goals for this hospitalization and ongoing recovery are:: To get better CMS Medicare.gov Compare Post Acute Care list provided to:: Patient Choice offered to / list presented to : Patient      Expected Discharge Plan and Services       Living arrangements for the past 2 months: Single Family Home                                      Prior Living Arrangements/Services Living arrangements for the past 2 months: Single Family Home Lives with:: Self Patient language and need for interpreter reviewed:: Yes Do you feel safe going back to the place where you live?: Yes        Care giver support system in place?: No (comment)      Activities of Daily Living Home Assistive Devices/Equipment: Walker (specify type), Eyeglasses, Shower chair with back, Raised toilet seat with rails ADL Screening (condition at time of admission) Patient's cognitive ability adequate to safely complete daily activities?: Yes Is the patient deaf or have difficulty hearing?: No Does the patient have difficulty seeing, even when wearing glasses/contacts?: No Does the patient have difficulty  concentrating, remembering, or making decisions?: Yes Patient able to express need for assistance with ADLs?: Yes Does the patient have difficulty dressing or bathing?: No Independently performs ADLs?: Yes (appropriate for developmental age) Does the patient have difficulty walking or climbing stairs?: Yes Weakness of Legs: Both Weakness of Arms/Hands: Both  Permission Sought/Granted                  Emotional Assessment Appearance:: Appears stated age     Orientation: : Oriented to Self, Oriented to Place, Oriented to  Time, Oriented to Situation      Admission diagnosis:  UTI (urinary tract infection) [N39.0] UTI due to extended-spectrum beta lactamase (ESBL) producing Escherichia coli [N39.0, B96.29, Z16.12] Patient Active Problem List   Diagnosis Date Noted   Hallucinations 09/02/2023   Asymptomatic bacteriuria 09/01/2023   Stasis dermatitis of both legs 08/30/2023   Hypomagnesemia 08/02/2023   Ankle pain, right 08/01/2023   Weakness of both lower extremities 07/30/2023   Morbid obesity (HCC) 07/30/2023   General weakness 07/15/2023   (HFpEF) heart failure with preserved ejection fraction (HCC) 07/15/2023   Abdominal pain 07/15/2023   AKI (acute kidney injury) (HCC) 07/15/2023   Acute on chronic respiratory failure with hypoxia and hypercapnia (HCC) 02/08/2023   Pneumonia due to infectious organism 02/05/2023   Chronic right-sided congestive heart failure (HCC) 02/04/2023  Fall 02/02/2023   UTI due to extended-spectrum beta lactamase (ESBL) producing Escherichia coli 07/22/2022   Physical deconditioning 07/22/2022   Fluid overload 07/21/2022   Acute cystitis with hematuria    Dyspnea 12/19/2021   Leg swelling 11/20/2021   Bilateral knee pain 10/29/2021   Hypernatremia 09/24/2021   Cellulitis of lower extremity 09/24/2021   CKD (chronic kidney disease), stage IIIa (HCC) 09/24/2021   Sacral decubitus ulcer, stage II (HCC) 09/20/2021   Acute on chronic heart  failure with preserved ejection fraction (HFpEF) (HCC) 09/19/2021   Nail disorder 07/07/2021   Pre-ulcerative corn or callous 07/07/2021   Rash 07/07/2021   Acute kidney injury superimposed on chronic kidney disease (HCC) 04/06/2021   Diastolic congestive heart failure (HCC) 03/17/2021   Diabetes mellitus type 2 in obese 03/11/2021   Vitamin D deficiency 01/01/2021   Medial epicondylitis 12/31/2020   Aortic atherosclerosis (HCC) 09/26/2020   Umbilical hernia 09/26/2020   Constipation 08/05/2020   Arthritis 06/12/2020   Hoarseness 05/01/2020   B12 deficiency 01/31/2020   Rosacea 01/31/2020   Neck swelling 01/31/2020   History of colonic polyps    Benign neoplasm of [place]    Nausea 08/31/2019   Throat pain 07/26/2019   HLD (hyperlipidemia) 05/24/2019   Leg cramps 05/24/2019   Lump in neck 05/24/2019   Left thyroid nodule 01/25/2019   Jerking movements of extremities 07/20/2018   Balance disorder 07/20/2018   Right knee pain 03/21/2018   OSA (obstructive sleep apnea) 03/21/2018   Oxygen desaturation 03/21/2018   Urinary symptom or sign 03/21/2018   Lymphedema 01/25/2018   Gait disorder 01/25/2018   Dysphonia 12/07/2017   Encounter for well adult exam with abnormal findings 11/08/2017   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   [place] polyp 08/11/2016   Abdominal pain, epigastric    Dysphagia    [place] cancer screening    ACE-inhibitor cough 12/05/2015   Restless legs syndrome 10/08/2015   Venous stasis dermatitis of both lower extremities 04/26/2015   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/17/2014   AR (allergic rhinitis) 10/17/2014   Fatty liver 10/17/2014   Chronic pain syndrome 09/19/2014   Post-traumatic osteoarthritis of both knees 09/19/2014   Spinal stenosis 09/19/2014   Depression, major, single episode, complete remission (HCC) 09/19/2014   Narcotic dependence (HCC) 09/19/2014   Acute hypoxic respiratory failure (HCC) 08/20/2014   False positive HIV serology  06/02/2013   Hypokalemia 06/29/2012   Spondylosis of 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 03/09/2012   Lumbar disc disease 03/09/2012   Chronic pain 03/09/2012   Morbid obesity with body mass index (BMI) of 50.0 to 59.9 in adult [facility]) 06/17/2011   Chronic low back pain 12/28/2008   Anxiety state 12/06/2007   Depression 12/06/2007   HTN (hypertension) 12/06/2007   Gastroesophageal reflux disease without esophagitis 12/06/2007   LOW BACK PAIN 12/06/2007   PCP:  Ignatius Specking, NP Pharmacy:   [facility] [phone] [address] - [place], [place] - [address] [place] [address] Phone: [phone] Fax: [phone]  [facility] - [place], [place] - [address] [place] [address] Phone: [phone] Fax: [phone]  [facility] [address] [place], [place] - [address] [place], [place] [address] [place], [place] [address] [place] [address] Phone: [phone] Fax: [phone]     Social Determinants of Health (SDOH) Social History: SDOH Screenings   Food Insecurity: Food Insecurity Present (08/31/2023)  Housing: Low Risk  (08/31/2023)  Transportation Needs: No Transportation Needs (08/31/2023)  Utilities: Not At Risk (08/31/2023)  Alcohol Screen: Low Risk  (12/30/2021)  Depression (PHQ2-9): Medium Risk (01/30/2022)  Financial Resource Strain: Medium Risk (12/30/2021)  Physical Activity: Inactive (12/30/2021)  Social Connections: Unknown (09/17/2022)   [facility] from [facility]  Stress: No Stress Concern Present (12/30/2021)  Tobacco Use: Medium Risk (08/30/2023)   SDOH Interventions:     Readmission Risk Interventions    07/22/2022    2:59 PM 04/11/2021   11:40 AM  Readmission Risk Prevention Plan  Transportation Screening Complete Complete  PCP or Specialist Appt within 3-5 Days Complete Complete  HRI or Home Care Consult Complete Complete  Social Work Consult for  Recovery Care Planning/Counseling Complete Complete  Palliative Care Screening Complete Not Applicable  Medication Review [employer]) Complete Complete

## 2023-09-03 NOTE — Plan of Care (Signed)

## 2023-09-04 DIAGNOSIS — I872 Venous insufficiency (chronic) (peripheral): Secondary | ICD-10-CM | POA: Diagnosis not present

## 2023-09-04 DIAGNOSIS — R5381 Other malaise: Secondary | ICD-10-CM | POA: Diagnosis not present

## 2023-09-04 DIAGNOSIS — G8929 Other chronic pain: Secondary | ICD-10-CM | POA: Diagnosis not present

## 2023-09-04 NOTE — Plan of Care (Signed)
Pt is progressing 

## 2023-09-04 NOTE — Plan of Care (Signed)
  Problem: Education: Goal: Knowledge of General Education information will improve Description: Including pain rating scale, medication(s)/side effects and non-pharmacologic comfort measures 09/04/2023 0121 by Conception Oms, RN Outcome: Progressing 09/04/2023 0121 by Conception Oms, RN Outcome: Progressing   Problem: Health Behavior/Discharge Planning: Goal: Ability to manage health-related needs will improve 09/04/2023 0121 by Conception Oms, RN Outcome: Progressing 09/04/2023 0121 by Conception Oms, RN Outcome: Progressing   Problem: Clinical Measurements: Goal: Ability to maintain clinical measurements within normal limits will improve 09/04/2023 0121 by Conception Oms, RN Outcome: Progressing 09/04/2023 0121 by Conception Oms, RN Outcome: Progressing Goal: Will remain free from infection 09/04/2023 0121 by Conception Oms, RN Outcome: Progressing 09/04/2023 0121 by Conception Oms, RN Outcome: Progressing Goal: Diagnostic test results will improve 09/04/2023 0121 by Conception Oms, RN Outcome: Progressing 09/04/2023 0121 by Conception Oms, RN Outcome: Progressing Goal: Respiratory complications will improve 09/04/2023 0121 by Conception Oms, RN Outcome: Progressing 09/04/2023 0121 by Conception Oms, RN Outcome: Progressing Goal: Cardiovascular complication will be avoided 09/04/2023 0121 by Conception Oms, RN Outcome: Progressing 09/04/2023 0121 by Conception Oms, RN Outcome: Progressing   Problem: Activity: Goal: Risk for activity intolerance will decrease 09/04/2023 0121 by Conception Oms, RN Outcome: Progressing 09/04/2023 0121 by Conception Oms, RN Outcome: Progressing   Problem: Nutrition: Goal: Adequate nutrition will be maintained 09/04/2023 0121 by Conception Oms, RN Outcome: Progressing 09/04/2023 0121 by Conception Oms, RN Outcome: Progressing   Problem: Coping: Goal: Level of anxiety will  decrease 09/04/2023 0121 by Conception Oms, RN Outcome: Progressing 09/04/2023 0121 by Conception Oms, RN Outcome: Progressing   Problem: Elimination: Goal: Will not experience complications related to bowel motility 09/04/2023 0121 by Conception Oms, RN Outcome: Progressing 09/04/2023 0121 by Conception Oms, RN Outcome: Progressing Goal: Will not experience complications related to urinary retention 09/04/2023 0121 by Conception Oms, RN Outcome: Progressing 09/04/2023 0121 by Conception Oms, RN Outcome: Progressing   Problem: Pain Managment: Goal: General experience of comfort will improve 09/04/2023 0121 by Conception Oms, RN Outcome: Progressing 09/04/2023 0121 by Conception Oms, RN Outcome: Progressing   Problem: Safety: Goal: Ability to remain free from injury will improve 09/04/2023 0121 by Conception Oms, RN Outcome: Progressing 09/04/2023 0121 by Conception Oms, RN Outcome: Progressing   Problem: Skin Integrity: Goal: Risk for impaired skin integrity will decrease 09/04/2023 0121 by Conception Oms, RN Outcome: Progressing 09/04/2023 0121 by Conception Oms, RN Outcome: Progressing

## 2023-09-04 NOTE — Progress Notes (Signed)
Occupational Therapy Treatment Patient Details Name: Maria [name] MRN: [id number] DOB: 14-Mar-1956 Today's Date: 09/04/2023   History of present illness Maria [name] is a 67 y.o. female with medical history significant of HTN, stasis dermatitis of bilateral LE, chronic diastolic HF, OSA, CKD 3a, sclerosing mesenteritis, T2DM, GERD, anxiety depression, morbid obesity, chronic pain syndrome, ESBL UTI who presents 08/30/23 with weakness and leg pain. Had a fall  after descending steps   OT comments  Patient was able to engage in transfer and bed mobility with less physical A than compared to evaluation. Patient was motivated to participate in session. Patient appeared to have good awareness of limitations and safety during session. Concerns over patients personal rollator in room that breaks are broken with patient reporting they have not worked in " a while". Patient will benefit from continued inpatient follow up therapy, <3 hours/day. Patient's discharge plan remains appropriate at this time. OT will continue to follow acutely.        If plan is discharge home, recommend the following:  Two people to help with walking and/or transfers;Two people to help with bathing/dressing/bathroom;Assistance with cooking/housework;Assist for transportation;Help with stairs or ramp for entrance   Equipment Recommendations  None recommended by OT       Precautions / Restrictions Precautions Precautions: Fall Restrictions Weight Bearing Restrictions: No       Mobility Bed Mobility Overal bed mobility: Needs Assistance Bed Mobility: Sidelying to Sit   Sidelying to sit: Min assist, HOB elevated, Used rails       General bed mobility comments: with increased time.       Balance Overall balance assessment: History of Falls, Needs assistance Sitting-balance support: Feet supported, Single extremity supported Sitting balance-Leahy Scale: Fair     Standing balance support: Bilateral upper  extremity supported, During functional activity, Reliant on assistive device for balance Standing balance-Leahy Scale: Poor           ADL either performed or assessed with clinical judgement   ADL Overall ADL's : Needs assistance/impaired                         Toilet Transfer: Moderate assistance;+2 for physical assistance;+2 for safety/equipment;Cueing for safety;Cueing for sequencing;Rollator (4 wheels) Toilet Transfer Details (indicate cue type and reason): to relciner in room with physical A to advance personal rollator. patients rollator was noted to have bad breaks. patient reproting they have not worked for some time.                  Cognition Arousal: Alert Behavior During Therapy: Anxious Overall Cognitive Status: Within Functional Limits for tasks assessed       General Comments: patient reported that she "knew what my limits are and that i should ny get up by myself."                   Pertinent Vitals/ Pain       Pain Assessment Pain Assessment: Faces Faces Pain Scale: Hurts a little bit Pain Location: bottom of bilateral feet Pain Descriptors / Indicators: Discomfort Pain Intervention(s): Monitored during session         Frequency  Min 1X/week        Progress Toward Goals  OT Goals(current goals can now be found in the care plan section)  Progress towards OT goals: Progressing toward goals     Plan         AM-PAC OT "6 Clicks" Daily Activity  Outcome Measure   Help from another person eating meals?: A Little Help from another person taking care of personal grooming?: A Little Help from another person toileting, which includes using toliet, bedpan, or urinal?: Total Help from another person bathing (including washing, rinsing, drying)?: Total Help from another person to put on and taking off regular upper body clothing?: A Little Help from another person to put on and taking off regular lower body clothing?: Total 6  Click Score: 12    End of Session Equipment Utilized During Treatment: Gait belt;Rollator (4 wheels)  OT Visit Diagnosis: Unsteadiness on feet (R26.81);Repeated falls (R29.6);Muscle weakness (generalized) (M62.81);History of falling (Z91.81);Pain   Activity Tolerance Patient tolerated treatment well   Patient Left in chair;with call bell/phone within reach;with chair alarm set   Nurse Communication Mobility status        Time: 4010-2725 OT Time Calculation (min): 20 min  Charges: OT General Charges $OT Visit: 1 Visit OT Treatments $Self Care/Home Management : 8-22 mins  Rosalio Loud, MS Acute Rehabilitation Department Office# [phone]   Selinda Flavin 09/04/2023, 12:46 PM

## 2023-09-04 NOTE — Progress Notes (Signed)
Progress Note    Maria [name]   ZOX:[id number]  DOB: 26-May-1956  DOA: 08/30/2023     5 PCP: Ignatius Specking, NP  Initial CC: weakness, falls  Hospital Course: Maria [name] is a 67 yo female with PMH morbid obesity, HTN, chronic stasis dermatitis, chronic diastolic CHF, OSA, DM II, GERD, anxiety/depression, chronic pain, recurrent UTIs, CKD3a who presented with generalized weakness and worsening pains in her knee, hip, back. She was also recently hospitalized from 07/28/2023 until 08/03/2023 after presenting to the hospital due to a mechanical fall from ongoing weakness as well.  She was discharged to rehab after the hospitalization. She again presented back to the hospital after ongoing chronic pains and recurrent falls.  Upon returning home from [facility] re[facility] she endorsed feeling weakness again and thus presented back to the hospital. There was some initial concern for possible UTI versus lower extremity cellulitis and she was started on antibiotics on admission.  PT was also consulted and SNF was recommended.   Interval History:  No further hallucinations.  States she slept most of the night.  Resting comfortably in bed.  We are still awaiting rehab.  Assessment and Plan: * Physical deconditioning - Unfortunately, suspect her weakness is more so due to multiple variables including her morbid obesity and low mobility state.  Recently discharged home from [place] and suspect she has been more immobile without ongoing PT. She also continues to remain on multiple sedating medications including clonazepam, gabapentin, oxycodone which are certainly contributing to her weakness and falls - will again have her evaluated by PT/OT and likely pursue rehab once again. I think at this rate, she may need to consider LTC if unable to keep returning home to live independently  Stasis dermatitis of both legs - no concern for cellulitis; d/c abx  Chronic pain -continue home PRN oxycodone and gaba -  unfortunately likely contributing to her debilitated state   Hallucinations - s/p having visual hallucinations 2 nights in a row.  Dilaudid has been discontinued.  She still remains on multiple psychotropic medications which she has been counseled about -Other considered etiology could be due to UTI but still suspect this is colonization -Hallucinations have finally resolved; continue avoiding further escalation of psychotropic medications including Dilaudid  Asymptomatic bacteriuria - suspect UA is due to colonization; clinically does not appear to be consistent with UTI - d/c abx and monitor off  CKD (chronic kidney disease), stage IIIa (HCC) - patient has history of CKD3a. Baseline creat ~ 1.1 - 1.2, eGFR~ 50   (HFpEF) heart failure with preserved ejection fraction (HCC) - No signs/symptoms of exacerbation - Continue Toprol, Aldactone, torsemide  Morbid obesity with body mass index (BMI) of 50.0 to 59.9 in adult [facility]) - Complicates overall prognosis and care - Body mass index is 53.32 kg/m.  HTN (hypertension) -controlled -continue metoprolol, spironolactone and torsemide   Old records reviewed in assessment of this patient  Antimicrobials: Meropenem 9/9 >>9/10 Gentamicin 9/10 x 1  DVT prophylaxis:  Lovenox   Code Status:   Code Status: Full Code  Mobility Assessment (Last 72 Hours)     Mobility Assessment     Row Name 09/04/23 1245 09/04/23 0800 09/04/23 0600 09/03/23 1935 09/03/23 0852   Does patient have an order for bedrest or is patient medically unstable -- No - Continue assessment No - Continue assessment No - Continue assessment No - Continue assessment   What is the highest level of mobility based on the progressive mobility assessment? Level  3 (Stands with assist) - Balance while standing  and cannot march in place Level 3 (Stands with assist) - Balance while standing  and cannot march in place Level 3 (Stands with assist) - Balance while standing  and  cannot march in place Level 3 (Stands with assist) - Balance while standing  and cannot march in place Level 3 (Stands with assist) - Balance while standing  and cannot march in place   Is the above level different from baseline mobility prior to current illness? -- Yes - Recommend PT order Yes - Recommend PT order Yes - Recommend PT order Yes - Recommend PT order    Row Name 09/02/23 2100 09/02/23 1038 09/02/23 1013 09/02/23 0915 09/01/23 2156   Does patient have an order for bedrest or is patient medically unstable No - Continue assessment -- -- No - Continue assessment No - Continue assessment   What is the highest level of mobility based on the progressive mobility assessment? Level 5 (Walks with assist in room/hall) - Balance while stepping forward/back and can walk in room with assist - Complete Level 3 (Stands with assist) - Balance while standing  and cannot march in place Level 3 (Stands with assist) - Balance while standing  and cannot march in place Level 3 (Stands with assist) - Balance while standing  and cannot march in place Level 3 (Stands with assist) - Balance while standing  and cannot march in place   Is the above level different from baseline mobility prior to current illness? Yes - Recommend PT order -- -- Yes - Recommend PT order Yes - Recommend PT order            Barriers to discharge: none Disposition Plan:  SNF.  Medically stable Status is: Inpt  Objective: Blood pressure (!) 136/48, pulse 73, temperature 98.1 F (36.7 C), temperature source Oral, resp. rate 13, height 5\' 3"  (1.6 m), weight (!) 136.5 kg, SpO2 99%.  Examination:  Physical Exam Constitutional:      General: She is not in acute distress.    Appearance: She is obese. She is not ill-appearing.  HENT:     Head: Normocephalic and atraumatic.     Mouth/Throat:     Mouth: Mucous membranes are moist.  Eyes:     Extraocular Movements: Extraocular movements intact.  Cardiovascular:     Rate and Rhythm:  Normal rate and regular rhythm.  Pulmonary:     Effort: Pulmonary effort is normal. No respiratory distress.     Breath sounds: Normal breath sounds. No wheezing.  Abdominal:     General: Bowel sounds are normal. There is no distension.     Palpations: Abdomen is soft.     Tenderness: There is no abdominal tenderness.  Musculoskeletal:        General: Swelling present. Normal range of motion.     Cervical back: Normal range of motion and neck supple.     Comments: Chronic B/L LE stasis dermatitis noted  Skin:    General: Skin is warm and dry.  Neurological:     General: No focal deficit present.     Mental Status: She is alert.  Psychiatric:        Mood and Affect: Mood normal.      Consultants:    Procedures:    Data Reviewed: No results found for this or any previous visit (from the past 24 hour(s)).  I have reviewed pertinent nursing notes, vitals, labs, and images as necessary. I  have ordered labwork to follow up on as indicated.  I have reviewed the last notes from staff over past 24 hours. I have discussed patient's care plan and test results with nursing staff, CM/SW, and other staff as appropriate.    LOS: 5 days   Lewie Chamber, MD [facility] 09/04/2023, 4:23 PM

## 2023-09-04 NOTE — Progress Notes (Signed)
Mobility Specialist - Progress Note   09/04/23 1207  Mobility  Activity Transferred from bed to chair  Level of Assistance +2 (takes two people) (mod A)  Assistive Device Front wheel walker  Distance Ambulated (ft) 5 ft  Range of Motion/Exercises Active  Activity Response Tolerated well  Mobility Referral Yes  $Mobility charge 1 Mobility  Mobility Specialist Start Time (ACUTE ONLY) 1150  Mobility Specialist Stop Time (ACUTE ONLY) 1205  Mobility Specialist Time Calculation (min) (ACUTE ONLY) 15 min   Pt received in bed and agreed to mobility. With OT pt was Mod A for STS, small shuffled side steps towards chair and left in chair with all needs met, alarm on.  Marilynne Halsted Mobility Specialist

## 2023-09-05 DIAGNOSIS — I872 Venous insufficiency (chronic) (peripheral): Secondary | ICD-10-CM | POA: Diagnosis not present

## 2023-09-05 DIAGNOSIS — R5381 Other malaise: Secondary | ICD-10-CM | POA: Diagnosis not present

## 2023-09-05 DIAGNOSIS — G8929 Other chronic pain: Secondary | ICD-10-CM | POA: Diagnosis not present

## 2023-09-05 NOTE — Plan of Care (Signed)
?  Problem: Clinical Measurements: ?Goal: Will remain free from infection ?Outcome: Progressing ?Goal: Diagnostic test results will improve ?Outcome: Progressing ?  ?Problem: Activity: ?Goal: Risk for activity intolerance will decrease ?Outcome: Progressing ?  ?Problem: Coping: ?Goal: Level of anxiety will decrease ?Outcome: Progressing ?  ?

## 2023-09-05 NOTE — Progress Notes (Signed)
Mobility Specialist - Progress Note   09/05/23 1455  Mobility  Activity Ambulated with assistance in hallway  Level of Assistance Moderate assist, patient does 50-74%  Assistive Device Front wheel walker  Distance Ambulated (ft) 30 ft  Range of Motion/Exercises Active  Activity Response Tolerated well  Mobility Referral Yes  $Mobility charge 1 Mobility  Mobility Specialist Start Time (ACUTE ONLY) 1430  Mobility Specialist Stop Time (ACUTE ONLY) 1447  Mobility Specialist Time Calculation (min) (ACUTE ONLY) 17 min   Pt received in bed and agreed to mobility, Min A for bed mobility, Mod A for sit to stand. Contact for ambulation. Returned to bed with all needs met.  Marilynne Halsted Mobility Specialist

## 2023-09-05 NOTE — Progress Notes (Signed)
Progress Note    Maria [name]   [id number]  DOB: 1956-11-19  DOA: 08/30/2023     6 PCP: Ignatius Specking, NP  Initial CC: weakness, falls  Hospital Course: Maria [name] is a 67 yo female with PMH morbid obesity, HTN, chronic stasis dermatitis, chronic diastolic CHF, OSA, DM II, GERD, anxiety/depression, chronic pain, recurrent UTIs, CKD3a who presented with generalized weakness and worsening pains in her knee, hip, back. She was also recently hospitalized from 07/28/2023 until 08/03/2023 after presenting to the hospital due to a mechanical fall from ongoing weakness as well.  She was discharged to rehab after the hospitalization. She again presented back to the hospital after ongoing chronic pains and recurrent falls.  Upon returning home from [facility] she endorsed feeling weakness again and thus presented back to the hospital. There was some initial concern for possible UTI versus lower extremity cellulitis and she was started on antibiotics on admission.  PT was also consulted and SNF was recommended.   Interval History:  No further hallucinations.  Resting well and eating well.  Stable for rehab.  Assessment and Plan: * Physical deconditioning - Unfortunately, suspect her weakness is more so due to multiple variables including her morbid obesity and low mobility state.  Recently discharged home from [place] and suspect she has been more immobile without ongoing PT. She also continues to remain on multiple sedating medications including clonazepam, gabapentin, oxycodone which are certainly contributing to her weakness and falls - will again have her evaluated by PT/OT and likely pursue rehab once again. I think at this rate, she may need to consider LTC if unable to keep returning home to live independently  Stasis dermatitis of both legs - no concern for cellulitis; d/c abx  Chronic pain -continue home PRN oxycodone and gaba - unfortunately likely contributing to her debilitated  state   Asymptomatic bacteriuria - suspect UA is due to colonization; clinically does not appear to be consistent with UTI - d/c abx and monitor off  Hallucinations-resolved as of 09/04/2023 - s/p having visual hallucinations 2 nights in a row.  Dilaudid has been discontinued.  She still remains on multiple psychotropic medications which she has been counseled about -Other considered etiology could be due to UTI but still suspect this is colonization -Hallucinations have finally resolved; continue avoiding further escalation of psychotropic medications including Dilaudid  CKD (chronic kidney disease), stage IIIa (HCC) - patient has history of CKD3a. Baseline creat ~ 1.1 - 1.2, eGFR~ 50   (HFpEF) heart failure with preserved ejection fraction (HCC) - No signs/symptoms of exacerbation - Continue Toprol, Aldactone, torsemide  Morbid obesity with body mass index (BMI) of 50.0 to 59.9 in adult [facility]) - Complicates overall prognosis and care - Body mass index is 53.32 kg/m.  HTN (hypertension) -controlled -continue metoprolol, spironolactone and torsemide   Old records reviewed in assessment of this patient  Antimicrobials: Meropenem 9/9 >>9/10 Gentamicin 9/10 x 1  DVT prophylaxis:  Lovenox   Code Status:   Code Status: Full Code  Mobility Assessment (Last 72 Hours)     Mobility Assessment     Row Name 09/05/23 1158 09/04/23 2300 09/04/23 1245 09/04/23 0800 09/04/23 0600   Does patient have an order for bedrest or is patient medically unstable No - Continue assessment No - Continue assessment -- No - Continue assessment No - Continue assessment   What is the highest level of mobility based on the progressive mobility assessment? Level 3 (Stands with assist) - Balance  while standing  and cannot march in place Level 3 (Stands with assist) - Balance while standing  and cannot march in place Level 3 (Stands with assist) - Balance while standing  and cannot march in place Level 3  (Stands with assist) - Balance while standing  and cannot march in place Level 3 (Stands with assist) - Balance while standing  and cannot march in place   Is the above level different from baseline mobility prior to current illness? Yes - Recommend PT order -- -- Yes - Recommend PT order Yes - Recommend PT order    Row Name 09/03/23 1935 09/03/23 1610 09/02/23 2100       Does patient have an order for bedrest or is patient medically unstable No - Continue assessment No - Continue assessment No - Continue assessment     What is the highest level of mobility based on the progressive mobility assessment? Level 3 (Stands with assist) - Balance while standing  and cannot march in place Level 3 (Stands with assist) - Balance while standing  and cannot march in place Level 5 (Walks with assist in room/hall) - Balance while stepping forward/back and can walk in room with assist - Complete     Is the above level different from baseline mobility prior to current illness? Yes - Recommend PT order Yes - Recommend PT order Yes - Recommend PT order              Barriers to discharge: none Disposition Plan:  SNF.  Medically stable Status is: Inpt  Objective: Blood pressure 122/60, pulse 65, temperature 98 F (36.7 C), resp. rate 20, height 5\' 3"  (1.6 m), weight (!) 136.5 kg, SpO2 100%.  Examination:  Physical Exam Constitutional:      General: She is not in acute distress.    Appearance: She is obese. She is not ill-appearing.  HENT:     Head: Normocephalic and atraumatic.     Mouth/Throat:     Mouth: Mucous membranes are moist.  Eyes:     Extraocular Movements: Extraocular movements intact.  Cardiovascular:     Rate and Rhythm: Normal rate and regular rhythm.  Pulmonary:     Effort: Pulmonary effort is normal. No respiratory distress.     Breath sounds: Normal breath sounds. No wheezing.  Abdominal:     General: Bowel sounds are normal. There is no distension.     Palpations: Abdomen is  soft.     Tenderness: There is no abdominal tenderness.  Musculoskeletal:        General: Swelling present. Normal range of motion.     Cervical back: Normal range of motion and neck supple.     Comments: Chronic B/L LE stasis dermatitis noted  Skin:    General: Skin is warm and dry.  Neurological:     General: No focal deficit present.     Mental Status: She is alert.  Psychiatric:        Mood and Affect: Mood normal.      Consultants:    Procedures:    Data Reviewed: No results found for this or any previous visit (from the past 24 hour(s)).  I have reviewed pertinent nursing notes, vitals, labs, and images as necessary. I have ordered labwork to follow up on as indicated.  I have reviewed the last notes from staff over past 24 hours. I have discussed patient's care plan and test results with nursing staff, CM/SW, and other staff as appropriate.  LOS: 6 days   Lewie Chamber, MD [facility] 09/05/2023, 12:25 PM

## 2023-09-05 NOTE — Plan of Care (Signed)
Problem: Education: Goal: Knowledge of General Education information will improve Description: Including pain rating scale, medication(s)/side effects and non-pharmacologic comfort measures Outcome: Progressing   Problem: Health Behavior/Discharge Planning: Goal: Ability to manage health-related needs will improve Outcome: Progressing   Problem: Clinical Measurements: Goal: Diagnostic test results will improve Outcome: Progressing   Problem: Activity: Goal: Risk for activity intolerance will decrease Outcome: Progressing   Problem: Nutrition: Goal: Adequate nutrition will be maintained Outcome: Progressing   Problem: Coping: Goal: Level of anxiety will decrease Outcome: Progressing   Problem: Pain Managment: Goal: General experience of comfort will improve Outcome: Progressing

## 2023-09-06 DIAGNOSIS — R8271 Bacteriuria: Secondary | ICD-10-CM | POA: Diagnosis not present

## 2023-09-06 DIAGNOSIS — I872 Venous insufficiency (chronic) (peripheral): Secondary | ICD-10-CM | POA: Diagnosis not present

## 2023-09-06 DIAGNOSIS — Z6841 Body Mass Index (BMI) 40.0 and over, adult: Secondary | ICD-10-CM

## 2023-09-06 DIAGNOSIS — R5381 Other malaise: Secondary | ICD-10-CM | POA: Diagnosis not present

## 2023-09-06 DIAGNOSIS — G8929 Other chronic pain: Secondary | ICD-10-CM | POA: Diagnosis not present

## 2023-09-06 NOTE — Progress Notes (Signed)
PTAR called at [phone]. Pt added to list by this Clinical research associate.

## 2023-09-06 NOTE — Progress Notes (Signed)
Mobility Specialist - Progress Note   09/06/23 1400  Mobility  Activity Ambulated with assistance in hallway  Level of Assistance Contact guard assist, steadying assist  Assistive Device Front wheel walker;Four wheel walker  Distance Ambulated (ft) 80 ft  Range of Motion/Exercises Active  Activity Response Tolerated well  Mobility Referral Yes  $Mobility charge 1 Mobility  Mobility Specialist Start Time (ACUTE ONLY) 1345  Mobility Specialist Stop Time (ACUTE ONLY) 1400  Mobility Specialist Time Calculation (min) (ACUTE ONLY) 15 min   Pt received in bed and agreed to mobility, had no issues throughout session. Needed one seated rest break, used rollator to sit and walker to walk. Continued with session back to bench with all needs met.  Marilynne Halsted Mobility Specialist

## 2023-09-06 NOTE — TOC Transition Note (Addendum)
   Spoke with pt's daughter about SNF placement. Per daughter they are unable to afford co-pay amounts for SNF. Pt's daughter is agreeable to alternative disposition for pt to return home with home health. Pt's daughter resides in [place] however, shares pt's niece and sister live in the area.  She is to reach out to pt's sister about staying with pt at discharge. Pt's niece is currently looking into LTC placement as a future option.  HHPT/OT/Aide/SW has been arranged with [facility]. PTAR called at 1:08pm for transportation.   Final next level of care: Home w Home Health Services Barriers to Discharge: Barriers Resolved   Patient Goals and CMS Choice CMS Medicare.gov Compare Post Acute Care list provided to:: Patient Choice offered to / list presented to : Patient  Discharge Placement                    Name of family member notified: daughter    Discharge Plan and Services Additional resources added to the After Visit Summary for                  DME Arranged: N/A DME Agency: NA       HH Arranged: PT, OT, Nurse's Aide, Social Work [employer] Agency: [facility] Date [facility] Agency Contacted: 09/06/23 Time HH Agency Contacted: 1312 Representative spoke with at [facility] Agency: angela  Social Determinants of Health (SDOH) Interventions SDOH Screenings   Food Insecurity: Food Insecurity Present (09/06/2023)  Housing: Low Risk  (08/31/2023)  Transportation Needs: No Transportation Needs (08/31/2023)  Utilities: Not At Risk (08/31/2023)  Alcohol Screen: Low Risk  (12/30/2021)  Depression (PHQ2-9): Medium Risk (01/30/2022)  Financial Resource Strain: Medium Risk (12/30/2021)  Physical Activity: Inactive (12/30/2021)  Social Connections: Unknown (09/17/2022)   Received from [facility], [facility]  Stress: No Stress Concern Present (12/30/2021)  Tobacco Use: Medium Risk (08/30/2023)     Readmission Risk Interventions    09/06/2023    1:10 PM 07/22/2022    2:59 PM 04/11/2021   11:40 AM   Readmission Risk Prevention Plan  Transportation Screening Complete Complete Complete  PCP or Specialist Appt within 3-5 Days Complete Complete Complete  HRI or Home Care Consult Complete Complete Complete  Social Work Consult for Recovery Care Planning/Counseling Complete Complete Complete  Palliative Care Screening Not Applicable Complete Not Applicable  Medication Review Oceanographer) Complete Complete Complete

## 2023-09-06 NOTE — Care Management Important Message (Signed)
Important Message  Patient Details IM Letter given. Name: Maria [name] MRN: [id number] Date of Birth: 11-17-1956   Medicare Important Message Given:  Yes     Caren Macadam 09/06/2023, 11:44 AM

## 2023-09-06 NOTE — Plan of Care (Signed)

## 2023-09-06 NOTE — Progress Notes (Signed)
Chaplain engaged in an initial visit with [name]. [name] shared about her recent healthcare journey and that she experienced some intense hallucinations recently. The hallucinations made [name] feel very fearful and have left her shaken up. She even expressed seeing her deceased mom and sister who told her that she was going to be okay. [name] voiced that spiritually she doesn't believe in being fearful.   Chaplain did some work of normalizing [name]'s emotions and hallucinations due to her UTI, any chemical imbalance, and pain medication reactions that can exist. Chaplain also affirmed fear as a human emotion. [name] did some work around understanding that her hallucinations were not real and that she felt empowered to call herself out of a state of fear. [name] engaged in some narrative life review thinking deeply about her family, faith, and significant events in her life.   Chaplain uplifted and affirmed [name], and offered prayer over her. Chaplain offered reflective listening, supportive and compassionate presence, and spiritual discourse.     09/06/23 1100  Spiritual Encounters  Type of Visit Initial  Care provided to: Patient  Reason for visit Routine spiritual support  Spiritual Framework  Presenting Themes Impactful experiences and emotions;Rituals and practive;Values and beliefs;Meaning/purpose/sources of inspiration  Interventions  Spiritual Care Interventions Made Established relationship of care and support;Compassionate presence;Reflective listening;Normalization of emotions;Encouragement;Meditation;Explored values/beliefs/practices/strengths  Intervention Outcomes  Outcomes Awareness of support;Reduced anxiety;Reduced fear;Connection to spiritual care;Awareness of health

## 2023-09-06 NOTE — Discharge Summary (Signed)
Physician Discharge Summary   Maria [name] VHQ:[id number] DOB: 1956-05-12 DOA: 08/30/2023  PCP: Ignatius Specking, NP  Admit date: 08/30/2023 Discharge date:  09/06/2023  Admitted From: Home Disposition:  Home with HH Discharging physician: Lewie Chamber, MD Barriers to discharge: awaited several days for SNF placement, then family unable to afford and therefore discharged with max HH assistance to home   Recommendations at discharge: Consider LTC placement   Home Health: PT, OT, SW, aide  Discharge Condition: stable CODE STATUS: Full Diet recommendation:  Diet Orders (From admission, onward)     Start     Ordered   09/06/23 0000  Diet - low sodium heart healthy        09/06/23 1248   08/30/23 2102  Diet Heart Room service appropriate? Yes; Fluid consistency: Thin  Diet effective now       Question Answer Comment  Room service appropriate? Yes   Fluid consistency: Thin      08/30/23 2102            Hospital Course: Ms. Handlin is a 67 yo female with PMH morbid obesity, HTN, chronic stasis dermatitis, chronic diastolic CHF, OSA, DM II, GERD, anxiety/depression, chronic pain, recurrent UTIs, CKD3a who presented with generalized weakness and worsening pains in her knee, hip, back. She was also recently hospitalized from 07/28/2023 until 08/03/2023 after presenting to the hospital due to a mechanical fall from ongoing weakness as well.  She was discharged to rehab after the hospitalization. She again presented back to the hospital after ongoing chronic pains and recurrent falls.  Upon returning home from [place] rehab she endorsed feeling weakness again and thus presented back to the hospital. There was some initial concern for possible UTI versus lower extremity cellulitis and she was started on antibiotics on admission.  PT was also consulted. Family was unable to afford SNF and therefore HH was ultimately arranged. Patient may warrant needing LTC which family is also aware of.    Assessment and Plan: * Physical deconditioning - Unfortunately, suspect her weakness is more so due to multiple variables including her morbid obesity and low mobility state.  Recently discharged home from [place] and suspect she has been more immobile without ongoing PT. She also continues to remain on multiple sedating medications including clonazepam, gabapentin, oxycodone which are certainly contributing to her weakness and falls - will again have her evaluated by PT/OT. I think at this rate, she may need to consider LTC as well; SNF unable to be arranged per family financial constraints and therefore she will be discharged home with home health  Stasis dermatitis of both legs - no concern for cellulitis; d/c abx  Chronic pain -continue home PRN oxycodone and gaba - unfortunately likely contributing to her debilitated state   Hallucinations-resolved as of 09/04/2023 - s/p having visual hallucinations 2 nights in a row.  Dilaudid has been discontinued.  She still remains on multiple psychotropic medications which she has been counseled about -Other considered etiology could be due to UTI but still suspect this is colonization -Hallucinations have finally resolved; continue avoiding further escalation of psychotropic medications including Dilaudid  Asymptomatic bacteriuria-resolved as of 09/06/2023 - suspect UA is due to colonization; clinically does not appear to be consistent with UTI - d/c abx and monitor off  CKD (chronic kidney disease), stage IIIa (HCC) - patient has history of CKD3a. Baseline creat ~ 1.1 - 1.2, eGFR~ 50   (HFpEF) heart failure with preserved ejection fraction (HCC) - No signs/symptoms of  exacerbation - Continue Toprol, Aldactone, torsemide  Morbid obesity with body mass index (BMI) of 50.0 to 59.9 in adult [facility]) - Complicates overall prognosis and care - Body mass index is 53.32 kg/m.  HTN (hypertension) -controlled -continue metoprolol, spironolactone  and torsemide    Principal Diagnosis: Physical deconditioning  Discharge Diagnoses: Active Hospital Problems   Diagnosis Date Noted   Physical deconditioning 07/22/2022    Priority: 1.   Stasis dermatitis of both legs 08/30/2023    Priority: 2.   Chronic pain 03/09/2012    Priority: 3.   CKD (chronic kidney disease), stage IIIa (HCC) 09/24/2021    Priority: 5.   (HFpEF) heart failure with preserved ejection fraction (HCC) 07/15/2023   Morbid obesity with body mass index (BMI) of 50.0 to 59.9 in adult [facility]) 06/17/2011   HTN (hypertension) 12/06/2007    Resolved Hospital Problems   Diagnosis Date Noted Date Resolved   Hallucinations 09/02/2023 09/04/2023    Priority: 4.   Asymptomatic bacteriuria 09/01/2023 09/06/2023    Priority: 4.     Discharge Instructions     Diet - low sodium heart healthy   Complete by: As directed    Increase activity slowly   Complete by: As directed       Allergies as of 09/06/2023       Reactions   Amoxicillin-pot Clavulanate Nausea And Vomiting   Projectile vomiting Did it involve swelling of the face/tongue/throat, SOB, or low BP? No Did it involve sudden or severe rash/hives, skin peeling, or any reaction on the inside of your mouth or nose? No Did you need to seek medical attention at a hospital or doctor's office? No When did it last happen?      20-30 years If all above answers are "NO", may proceed with cephalosporin use.   Adhesive [tape] Other (See Comments)   Tears skin off - use paper tape    Codeine Other (See Comments)   hallucinations   Crestor [rosuvastatin Calcium]    Severe muscle cramps   Lyrica [pregabalin] Other (See Comments)   weakness   Morphine And Codeine Nausea And Vomiting, Other (See Comments)   Migraine headaches   Vicodin [hydrocodone-acetaminophen] Other (See Comments)   hallucinations   Latex Rash        Medication List     TAKE these medications    Accu-Chek Guide test strip Generic drug:  glucose blood Use as instructed up to 4 times daily   Accu-Chek Guide w/Device Kit Use as directed What changed:  how much to take how to take this when to take this additional instructions   cholecalciferol 25 MCG (1000 UNIT) tablet Commonly known as: VITAMIN D3 Take 2,000 Units by mouth daily.   clonazePAM 1 MG tablet Commonly known as: KLONOPIN TAKE ONE TABLET BY MOUTH TWICE DAILY AS NEEDED FOR ANXIETY What changed: reasons to take this   cyanocobalamin 1000 MCG tablet Commonly known as: VITAMIN B12 Take 1 tablet (1,000 mcg total) by mouth daily.   DULoxetine 20 MG capsule Commonly known as: CYMBALTA Take 20 mg by mouth daily.   fluticasone 50 MCG/ACT nasal spray Commonly known as: FLONASE Place 1 spray into both nostrils daily.   Metamucil Fiber Chew Chew 3 tablets by mouth at bedtime.   metoprolol succinate 25 MG 24 hr tablet Commonly known as: TOPROL-XL Take 1 tablet (25 mg total) by mouth daily.   Mounjaro 10 MG/0.5ML Pen Generic drug: tirzepatide Inject 10 mg into the skin once a week.  Narcan 4 MG/0.1ML Liqd nasal spray kit Generic drug: naloxone Place 0.4 mg into the nose daily as needed (opioid overdose).   Oxycodone HCl 10 MG Tabs Take 1 tablet (10 mg total) 4 (four) times daily by mouth. Per [facility] What changed:  when to take this reasons to take this   pantoprazole 40 MG tablet Commonly known as: PROTONIX TAKE ONE TABLET BY MOUTH DAILY   potassium chloride SA 20 MEQ tablet Commonly known as: KLOR-CON M Take 2 tablets (40 mEq total) by mouth 2 (two) times daily.   pramipexole 0.5 MG tablet Commonly known as: MIRAPEX Take 1 tablet (0.5 mg total) by mouth 3 (three) times daily.   spironolactone 25 MG tablet Commonly known as: ALDACTONE Take 0.5 tablets (12.5 mg total) by mouth daily. What changed: how much to take   torsemide 20 MG tablet Commonly known as: DEMADEX Take 2 tablets (40 mg total) by mouth daily.         Follow-up Information     [facility] Follow up.   Why: [facility] will fol!ow up with you at discharge to provide home health services               Allergies  Allergen Reactions   Amoxicillin-Pot Clavulanate Nausea And Vomiting    Projectile vomiting Did it involve swelling of the face/tongue/throat, SOB, or low BP? No Did it involve sudden or severe rash/hives, skin peeling, or any reaction on the inside of your mouth or nose? No Did you need to seek medical attention at a hospital or doctor's office? No When did it last happen?      20-30 years If all above answers are "NO", may proceed with cephalosporin use.    Adhesive [Tape] Other (See Comments)    Tears skin off - use paper tape    Codeine Other (See Comments)    hallucinations   Crestor [Rosuvastatin Calcium]     Severe muscle cramps   Lyrica [Pregabalin] Other (See Comments)    weakness   Morphine And Codeine Nausea And Vomiting and Other (See Comments)    Migraine headaches   Vicodin [Hydrocodone-Acetaminophen] Other (See Comments)    hallucinations   Latex Rash    Consultations:   Procedures:   Discharge Exam: BP (!) 134/59 (BP Location: Left Arm)   Pulse 69   Temp 97.9 F (36.6 C)   Resp 16   Ht 5\' 3"  (1.6 m)   Wt (!) 136.5 kg   SpO2 100%   BMI 53.32 kg/m  Physical Exam Constitutional:      General: She is not in acute distress.    Appearance: She is obese. She is not ill-appearing.  HENT:     Head: Normocephalic and atraumatic.     Mouth/Throat:     Mouth: Mucous membranes are moist.  Eyes:     Extraocular Movements: Extraocular movements intact.  Cardiovascular:     Rate and Rhythm: Normal rate and regular rhythm.  Pulmonary:     Effort: Pulmonary effort is normal. No respiratory distress.     Breath sounds: Normal breath sounds. No wheezing.  Abdominal:     General: Bowel sounds are normal. There is no distension.     Palpations: Abdomen is soft.     Tenderness:  There is no abdominal tenderness.  Musculoskeletal:        General: Swelling present. Normal range of motion.     Cervical back: Normal range of motion and neck supple.  Comments: Chronic B/L LE stasis dermatitis noted  Skin:    General: Skin is warm and dry.  Neurological:     General: No focal deficit present.     Mental Status: She is alert.  Psychiatric:        Mood and Affect: Mood normal.      The results of significant diagnostics from this hospitalization (including imaging, microbiology, ancillary and laboratory) are listed below for reference.   Microbiology: Recent Results (from the past 240 hour(s))  Urine Culture     Status: Abnormal   Collection Time: 08/30/23  5:46 PM   Specimen: Urine, Clean Catch  Result Value Ref Range Status   Specimen Description   Final    URINE, CLEAN CATCH Performed at [facility], [address], [place], [place] [address]    Special Requests   Final    NONE Performed at [facility], [address], [place], [place] [address]    Culture >=100,000 COLONIES/mL KLEBSIELLA PNEUMONIAE (A)  Final   Report Status 09/01/2023 FINAL  Final   Organism ID, Bacteria KLEBSIELLA PNEUMONIAE (A)  Final      Susceptibility   Klebsiella pneumoniae - MIC*    AMPICILLIN >=32 RESISTANT Resistant     CEFAZOLIN <=4 SENSITIVE Sensitive     CEFEPIME <=0.12 SENSITIVE Sensitive     CEFTRIAXONE <=0.25 SENSITIVE Sensitive     CIPROFLOXACIN <=0.25 SENSITIVE Sensitive     GENTAMICIN <=1 SENSITIVE Sensitive     IMIPENEM <=0.25 SENSITIVE Sensitive     NITROFURANTOIN 128 RESISTANT Resistant     TRIMETH/SULFA <=20 SENSITIVE Sensitive     AMPICILLIN/SULBACTAM 4 SENSITIVE Sensitive     PIP/TAZO <=4 SENSITIVE Sensitive     * >=100,000 COLONIES/mL KLEBSIELLA PNEUMONIAE     Labs: BNP (last 3 results) Recent Labs    02/02/23 0724 02/07/23 1806 08/30/23 1623  BNP 26.2 216.5* 17.1   Basic Metabolic Panel: No results  for input(s): "NA", "K", "CL", "CO2", "GLUCOSE", "BUN", "CREATININE", "CALCIUM", "MG", "PHOS" in the last 168 hours. Liver Function Tests: No results for input(s): "AST", "ALT", "ALKPHOS", "BILITOT", "PROT", "ALBUMIN" in the last 168 hours. No results for input(s): "LIPASE", "AMYLASE" in the last 168 hours. No results for input(s): "AMMONIA" in the last 168 hours. CBC: Recent Labs  Lab 08/30/23 2355  WBC 6.6  HGB 11.1*  HCT 33.5*  MCV 88.6  PLT 157   Cardiac Enzymes: No results for input(s): "CKTOTAL", "CKMB", "CKMBINDEX", "TROPONINI" in the last 168 hours. BNP: Invalid input(s): "POCBNP" CBG: No results for input(s): "GLUCAP" in the last 168 hours. D-Dimer No results for input(s): "DDIMER" in the last 72 hours. Hgb A1c No results for input(s): "HGBA1C" in the last 72 hours. Lipid Profile No results for input(s): "CHOL", "HDL", "LDLCALC", "TRIG", "CHOLHDL", "LDLDIRECT" in the last 72 hours. Thyroid function studies No results for input(s): "TSH", "T4TOTAL", "T3FREE", "THYROIDAB" in the last 72 hours.  Invalid input(s): "FREET3" Anemia work up No results for input(s): "VITAMINB12", "FOLATE", "FERRITIN", "TIBC", "IRON", "RETICCTPCT" in the last 72 hours. Urinalysis    Component Value Date/Time   COLORURINE YELLOW 08/30/2023 1623   APPEARANCEUR HAZY (A) 08/30/2023 1623   LABSPEC 1.008 08/30/2023 1623   PHURINE 6.0 08/30/2023 1623   GLUCOSEU NEGATIVE 08/30/2023 1623   GLUCOSEU NEGATIVE 01/30/2022 1342   HGBUR SMALL (A) 08/30/2023 1623   BILIRUBINUR NEGATIVE 08/30/2023 1623   KETONESUR NEGATIVE 08/30/2023 1623   PROTEINUR NEGATIVE 08/30/2023 1623   UROBILINOGEN 0.2 01/30/2022 1342  NITRITE POSITIVE (A) 08/30/2023 1623   LEUKOCYTESUR SMALL (A) 08/30/2023 1623   Sepsis Labs Recent Labs  Lab 08/30/23 2355  WBC 6.6   Microbiology Recent Results (from the past 240 hour(s))  Urine Culture     Status: Abnormal   Collection Time: 08/30/23  5:46 PM   Specimen: Urine,  Clean Catch  Result Value Ref Range Status   Specimen Description   Final    URINE, CLEAN CATCH Performed at [facility], [address], [place], [place] [address]    Special Requests   Final    NONE Performed at [facility], [address] [place], [place] [address]    Culture >=100,000 COLONIES/mL KLEBSIELLA PNEUMONIAE (A)  Final   Report Status 09/01/2023 FINAL  Final   Organism ID, Bacteria KLEBSIELLA PNEUMONIAE (A)  Final      Susceptibility   Klebsiella pneumoniae - MIC*    AMPICILLIN >=32 RESISTANT Resistant     CEFAZOLIN <=4 SENSITIVE Sensitive     CEFEPIME <=0.12 SENSITIVE Sensitive     CEFTRIAXONE <=0.25 SENSITIVE Sensitive     CIPROFLOXACIN <=0.25 SENSITIVE Sensitive     GENTAMICIN <=1 SENSITIVE Sensitive     IMIPENEM <=0.25 SENSITIVE Sensitive     NITROFURANTOIN 128 RESISTANT Resistant     TRIMETH/SULFA <=20 SENSITIVE Sensitive     AMPICILLIN/SULBACTAM 4 SENSITIVE Sensitive     PIP/TAZO <=4 SENSITIVE Sensitive     * >=100,000 COLONIES/mL KLEBSIELLA PNEUMONIAE    Procedures/Studies: DG Tibia/Fibula Left  Result Date: 08/30/2023 CLINICAL DATA:  Pain after fall EXAM: LEFT TIBIA AND FIBULA - 2 VIEW COMPARISON:  None Available. FINDINGS: No acute fracture or dislocation. Preserved bone mineralization. Degenerative changes seen of the knee joint as well as about the ankle with the calcaneal spurs. Soft tissue swelling. IMPRESSION: Soft tissue swelling.  No acute osseous abnormality. Electronically Signed   By: Karen Kays M.D.   On: 08/30/2023 17:51   DG Knee Complete 4 Views Right  Result Date: 08/30/2023 CLINICAL DATA:  Pain after fall. EXAM: RIGHT KNEE - COMPLETE 5 VIEW COMPARISON:  None Available. FINDINGS: Tricompartmental degenerative changes identified. There is moderate joint space loss of the medial compartment. Slight medial translation of the distal femur relative to the tibia. No joint effusion. No fracture or dislocation.  Under penetrated radiographs. IMPRESSION: Limited radiographs. Tricompartmental degenerative changes greatest of the medial compartment. Electronically Signed   By: Karen Kays M.D.   On: 08/30/2023 17:50   DG Knee Complete 4 Views Left  Result Date: 08/30/2023 CLINICAL DATA:  Pain after fall EXAM: LEFT KNEE - COMPLETE 3 VIEW COMPARISON:  None Available. FINDINGS: No fracture or dislocation. Preserved joint spaces and bone mineralization except for joint space loss of the medial compartment. There osteophyte formation seen of all 3 compartments. No joint effusion on lateral view. Limited portable x-rays. IMPRESSION: Portable x-rays.  Some limitation with under penetration. Tricompartmental degenerative changes seen greatest of the medial compartment. Electronically Signed   By: Karen Kays M.D.   On: 08/30/2023 17:49   DG Chest 2 View  Result Date: 08/30/2023 CLINICAL DATA:  Leg swelling. EXAM: CHEST - 2 VIEW COMPARISON:  X-ray 02/07/2023. FINDINGS: Enlarged cardiopericardial silhouette with some vascular congestion. No frank edema. No pneumothorax, effusion or consolidation. Overlapping cardiac leads. Fixation hardware along the lower cervical spine at the edge of the imaging field. Films are under penetrated. IMPRESSION: Enlarged heart with vascular congestion. Electronically Signed   By: Piedad Climes.D.  On: 08/30/2023 17:47     Time coordinating discharge: Over 30 minutes    Lewie Chamber, MD  [facility] 09/06/2023, 4:21 PM

## 2023-10-09 ENCOUNTER — Encounter (HOSPITAL_COMMUNITY): Payer: Self-pay

## 2023-10-09 ENCOUNTER — Observation Stay (HOSPITAL_COMMUNITY)
Admission: EM | Admit: 2023-10-09 | Discharge: 2023-10-14 | Disposition: A | Discharge status: Home Health | Payer: 59 | Attending: Family Medicine | Admitting: Family Medicine

## 2023-10-09 ENCOUNTER — Other Ambulatory Visit: Payer: Self-pay

## 2023-10-09 ENCOUNTER — Emergency Department (HOSPITAL_COMMUNITY): Payer: 59

## 2023-10-09 DIAGNOSIS — I5033 Acute on chronic diastolic (congestive) heart failure: Secondary | ICD-10-CM | POA: Insufficient documentation

## 2023-10-09 DIAGNOSIS — Z79899 Other long term (current) drug therapy: Secondary | ICD-10-CM | POA: Insufficient documentation

## 2023-10-09 DIAGNOSIS — Z9181 History of falling: Secondary | ICD-10-CM | POA: Diagnosis not present

## 2023-10-09 DIAGNOSIS — I5032 Chronic diastolic (congestive) heart failure: Secondary | ICD-10-CM | POA: Diagnosis present

## 2023-10-09 DIAGNOSIS — R2689 Other abnormalities of gait and mobility: Secondary | ICD-10-CM | POA: Diagnosis not present

## 2023-10-09 DIAGNOSIS — Z87891 Personal history of nicotine dependence: Secondary | ICD-10-CM | POA: Diagnosis not present

## 2023-10-09 DIAGNOSIS — I890 Lymphedema, not elsewhere classified: Secondary | ICD-10-CM

## 2023-10-09 DIAGNOSIS — R2681 Unsteadiness on feet: Secondary | ICD-10-CM | POA: Diagnosis not present

## 2023-10-09 DIAGNOSIS — N1831 Chronic kidney disease, stage 3a: Secondary | ICD-10-CM | POA: Diagnosis present

## 2023-10-09 DIAGNOSIS — G894 Chronic pain syndrome: Secondary | ICD-10-CM | POA: Diagnosis present

## 2023-10-09 DIAGNOSIS — R443 Hallucinations, unspecified: Secondary | ICD-10-CM | POA: Diagnosis present

## 2023-10-09 DIAGNOSIS — Z23 Encounter for immunization: Secondary | ICD-10-CM | POA: Diagnosis not present

## 2023-10-09 DIAGNOSIS — I130 Hypertensive heart and chronic kidney disease with heart failure and stage 1 through stage 4 chronic kidney disease, or unspecified chronic kidney disease: Secondary | ICD-10-CM | POA: Diagnosis not present

## 2023-10-09 DIAGNOSIS — N179 Acute kidney failure, unspecified: Secondary | ICD-10-CM | POA: Diagnosis not present

## 2023-10-09 DIAGNOSIS — K219 Gastro-esophageal reflux disease without esophagitis: Secondary | ICD-10-CM | POA: Diagnosis present

## 2023-10-09 DIAGNOSIS — R5381 Other malaise: Secondary | ICD-10-CM | POA: Diagnosis present

## 2023-10-09 DIAGNOSIS — E119 Type 2 diabetes mellitus without complications: Secondary | ICD-10-CM | POA: Diagnosis present

## 2023-10-09 DIAGNOSIS — Z1152 Encounter for screening for COVID-19: Secondary | ICD-10-CM | POA: Insufficient documentation

## 2023-10-09 DIAGNOSIS — E1122 Type 2 diabetes mellitus with diabetic chronic kidney disease: Secondary | ICD-10-CM | POA: Insufficient documentation

## 2023-10-09 DIAGNOSIS — M6281 Muscle weakness (generalized): Secondary | ICD-10-CM | POA: Insufficient documentation

## 2023-10-09 DIAGNOSIS — Z91040 Latex allergy status: Secondary | ICD-10-CM | POA: Insufficient documentation

## 2023-10-09 DIAGNOSIS — Z789 Other specified health status: Secondary | ICD-10-CM | POA: Insufficient documentation

## 2023-10-09 LAB — CBC WITH DIFFERENTIAL/PLATELET
Abs Immature Granulocytes: 0.09 10*3/uL — ABNORMAL HIGH (ref 0.00–0.07)
Basophils Absolute: 0.1 10*3/uL (ref 0.0–0.1)
Basophils Relative: 0 %
Eosinophils Absolute: 0 10*3/uL (ref 0.0–0.5)
Eosinophils Relative: 0 %
HCT: 36.9 % (ref 36.0–46.0)
Hemoglobin: 11.8 g/dL — ABNORMAL LOW (ref 12.0–15.0)
Immature Granulocytes: 1 %
Lymphocytes Relative: 11 %
Lymphs Abs: 1.7 10*3/uL (ref 0.7–4.0)
MCH: 30.2 pg (ref 26.0–34.0)
MCHC: 32 g/dL (ref 30.0–36.0)
MCV: 94.4 fL (ref 80.0–100.0)
Monocytes Absolute: 0.8 10*3/uL (ref 0.1–1.0)
Monocytes Relative: 5 %
Neutro Abs: 12.6 10*3/uL — ABNORMAL HIGH (ref 1.7–7.7)
Neutrophils Relative %: 83 %
Platelets: 310 10*3/uL (ref 150–400)
RBC: 3.91 MIL/uL (ref 3.87–5.11)
RDW: 13.5 % (ref 11.5–15.5)
WBC: 15.3 10*3/uL — ABNORMAL HIGH (ref 4.0–10.5)
nRBC: 0 % (ref 0.0–0.2)

## 2023-10-09 LAB — URINALYSIS, W/ REFLEX TO CULTURE (INFECTION SUSPECTED)
Bilirubin Urine: NEGATIVE
Glucose, UA: NEGATIVE mg/dL
Hgb urine dipstick: NEGATIVE
Ketones, ur: NEGATIVE mg/dL
Nitrite: NEGATIVE
Protein, ur: NEGATIVE mg/dL
Specific Gravity, Urine: 1.018 (ref 1.005–1.030)
pH: 5 (ref 5.0–8.0)

## 2023-10-09 LAB — COMPREHENSIVE METABOLIC PANEL
ALT: 17 U/L (ref 0–44)
AST: 21 U/L (ref 15–41)
Albumin: 3.8 g/dL (ref 3.5–5.0)
Alkaline Phosphatase: 81 U/L (ref 38–126)
Anion gap: 9 (ref 5–15)
BUN: 18 mg/dL (ref 8–23)
CO2: 25 mmol/L (ref 22–32)
Calcium: 9.1 mg/dL (ref 8.9–10.3)
Chloride: 101 mmol/L (ref 98–111)
Creatinine, Ser: 1.85 mg/dL — ABNORMAL HIGH (ref 0.44–1.00)
GFR, Estimated: 30 mL/min — ABNORMAL LOW (ref >60)
Glucose, Bld: 98 mg/dL (ref 70–99)
Potassium: 5.2 mmol/L — ABNORMAL HIGH (ref 3.5–5.1)
Sodium: 135 mmol/L (ref 135–145)
Total Bilirubin: 1.4 mg/dL — ABNORMAL HIGH (ref 0.3–1.2)
Total Protein: 7.5 g/dL (ref 6.5–8.1)

## 2023-10-09 LAB — LIPASE, BLOOD: Lipase: 26 U/L (ref 11–51)

## 2023-10-09 LAB — BRAIN NATRIURETIC PEPTIDE: B Natriuretic Peptide: 20.8 pg/mL (ref 0.0–100.0)

## 2023-10-09 LAB — TROPONIN I (HIGH SENSITIVITY)
Troponin I (High Sensitivity): 9 ng/L (ref <18)
Troponin I (High Sensitivity): 9 ng/L (ref <18)

## 2023-10-09 LAB — SARS CORONAVIRUS 2 BY RT PCR: SARS Coronavirus 2 by RT PCR: NEGATIVE

## 2023-10-09 LAB — GLUCOSE, CAPILLARY: Glucose-Capillary: 136 mg/dL — ABNORMAL HIGH (ref 70–99)

## 2023-10-09 MED ORDER — TRAZODONE HCL 50 MG PO TABS
50.0000 mg | ORAL_TABLET | Freq: Every evening | ORAL | Status: DC | PRN
  Administered 2023-10-13 (×2): 50 mg via ORAL
  Filled 2023-10-09 (×3): qty 1

## 2023-10-09 MED ORDER — PRAMIPEXOLE DIHYDROCHLORIDE 0.25 MG PO TABS
0.5000 mg | ORAL_TABLET | Freq: Three times a day (TID) | ORAL | Status: DC
  Administered 2023-10-09 – 2023-10-14 (×15): 0.5 mg via ORAL
  Filled 2023-10-09 (×16): qty 2

## 2023-10-09 MED ORDER — INSULIN ASPART 100 UNIT/ML IJ SOLN
0.0000 [IU] | Freq: Three times a day (TID) | INTRAMUSCULAR | Status: DC
  Administered 2023-10-10 – 2023-10-13 (×5): 3 [IU] via SUBCUTANEOUS
  Administered 2023-10-14: 2 [IU] via SUBCUTANEOUS
  Filled 2023-10-09: qty 0.2

## 2023-10-09 MED ORDER — ONDANSETRON HCL 4 MG PO TABS: 4.0000 mg | ORAL_TABLET | Freq: Four times a day (QID) | ORAL | Status: DC | PRN

## 2023-10-09 MED ORDER — METOPROLOL SUCCINATE ER 25 MG PO TB24
25.0000 mg | ORAL_TABLET | Freq: Every day | ORAL | Status: DC
  Administered 2023-10-09 – 2023-10-14 (×6): 25 mg via ORAL
  Filled 2023-10-09 (×6): qty 1

## 2023-10-09 MED ORDER — ACETAMINOPHEN 650 MG RE SUPP: 650.0000 mg | Freq: Four times a day (QID) | RECTAL | Status: DC | PRN

## 2023-10-09 MED ORDER — POLYETHYLENE GLYCOL 3350 17 G PO PACK
17.0000 g | PACK | Freq: Every day | ORAL | Status: DC | PRN
  Filled 2023-10-09: qty 1

## 2023-10-09 MED ORDER — ENOXAPARIN SODIUM 40 MG/0.4ML IJ SOSY
40.0000 mg | PREFILLED_SYRINGE | INTRAMUSCULAR | Status: DC
  Administered 2023-10-09: 40 mg via SUBCUTANEOUS
  Filled 2023-10-09: qty 0.4

## 2023-10-09 MED ORDER — ACETAMINOPHEN 325 MG PO TABS
650.0000 mg | ORAL_TABLET | Freq: Four times a day (QID) | ORAL | Status: DC | PRN
  Administered 2023-10-09 – 2023-10-14 (×5): 650 mg via ORAL
  Filled 2023-10-09 (×5): qty 2

## 2023-10-09 MED ORDER — FLUTICASONE PROPIONATE 50 MCG/ACT NA SUSP
1.0000 | Freq: Every day | NASAL | Status: DC
  Administered 2023-10-10 – 2023-10-14 (×5): 1 via NASAL
  Filled 2023-10-09: qty 16

## 2023-10-09 MED ORDER — CLONAZEPAM 1 MG PO TABS
1.0000 mg | ORAL_TABLET | Freq: Two times a day (BID) | ORAL | Status: DC | PRN
  Administered 2023-10-09 – 2023-10-14 (×4): 1 mg via ORAL
  Filled 2023-10-09 (×4): qty 1

## 2023-10-09 MED ORDER — VITAMIN B-12 1000 MCG PO TABS
1000.0000 ug | ORAL_TABLET | Freq: Every day | ORAL | Status: DC
  Administered 2023-10-10 – 2023-10-14 (×5): 1000 ug via ORAL
  Filled 2023-10-09 (×5): qty 1

## 2023-10-09 MED ORDER — SODIUM CHLORIDE 0.9% FLUSH
3.0000 mL | Freq: Two times a day (BID) | INTRAVENOUS | Status: DC
  Administered 2023-10-09: 10 mL via INTRAVENOUS
  Administered 2023-10-10 – 2023-10-13 (×8): 3 mL via INTRAVENOUS

## 2023-10-09 MED ORDER — OXYCODONE HCL 5 MG PO TABS
10.0000 mg | ORAL_TABLET | Freq: Once | ORAL | Status: AC
  Administered 2023-10-09: 10 mg via ORAL
  Filled 2023-10-09: qty 2

## 2023-10-09 MED ORDER — OXYCODONE HCL 5 MG PO TABS
10.0000 mg | ORAL_TABLET | ORAL | Status: DC | PRN
  Administered 2023-10-09 – 2023-10-13 (×11): 10 mg via ORAL
  Filled 2023-10-09 (×12): qty 2

## 2023-10-09 MED ORDER — SODIUM CHLORIDE 0.9 % IV BOLUS
500.0000 mL | Freq: Once | INTRAVENOUS | Status: AC
  Administered 2023-10-09: 500 mL via INTRAVENOUS

## 2023-10-09 MED ORDER — PANTOPRAZOLE SODIUM 40 MG PO TBEC
40.0000 mg | DELAYED_RELEASE_TABLET | Freq: Two times a day (BID) | ORAL | Status: DC
  Administered 2023-10-09 – 2023-10-14 (×10): 40 mg via ORAL
  Filled 2023-10-09 (×10): qty 1

## 2023-10-09 MED ORDER — DULOXETINE HCL 20 MG PO CPEP
20.0000 mg | ORAL_CAPSULE | Freq: Every day | ORAL | Status: DC
  Administered 2023-10-09 – 2023-10-14 (×6): 20 mg via ORAL
  Filled 2023-10-09 (×6): qty 1

## 2023-10-09 MED ORDER — PNEUMOCOCCAL 20-VAL CONJ VACC 0.5 ML IM SUSY
0.5000 mL | PREFILLED_SYRINGE | INTRAMUSCULAR | Status: AC
  Administered 2023-10-13: 0.5 mL via INTRAMUSCULAR
  Filled 2023-10-09: qty 0.5

## 2023-10-09 MED ORDER — INFLUENZA VAC A&B SURF ANT ADJ 0.5 ML IM SUSY
0.5000 mL | PREFILLED_SYRINGE | INTRAMUSCULAR | Status: AC
  Administered 2023-10-13: 0.5 mL via INTRAMUSCULAR
  Filled 2023-10-09: qty 0.5

## 2023-10-09 MED ORDER — ONDANSETRON HCL 4 MG/2ML IJ SOLN
4.0000 mg | Freq: Four times a day (QID) | INTRAMUSCULAR | Status: DC | PRN
  Administered 2023-10-10 – 2023-10-11 (×3): 4 mg via INTRAVENOUS
  Filled 2023-10-09 (×3): qty 2

## 2023-10-09 NOTE — ED Notes (Signed)
Pt unable to use bedpan successfully, periwick requested

## 2023-10-09 NOTE — H&P (Signed)
History and Physical    Patient: Maria [name] ZDG:[id number] DOB: [birth year] DOA: 10/09/2023 DOS: the patient was seen and examined on 10/09/2023 PCP: Ignatius Specking, NP  Patient coming from: Home  Chief Complaint:  Chief Complaint  Patient presents with   Hallucinations   HPI: Maria [name] is a 67 y.o. female with medical history significant for chronic pain, morbid obesity, CKD 3A, anxiety/depression, hypertension, GERD, OSA, HFpEF, type 2 diabetes, who presents with weakness.  Of note patient was admitted to [facility] from September 9 to 16, primary reason for admission at that time was generalized weakness and worsening pain.  Plan had been to discharge to a SNF but due to financial barriers they were unable to do this and she was discharged with home health.  Notably she also had hallucinations during that admission, primarily at night, unclear etiology but suspected to be related to polypharmacy.  Also of note, her admission in September was her fourth admission to the hospital this year.  On my interview patient reports that she has been feeling generally weak and unwell for the past few days.  Primary reason for presentation was that she was unable to walk with her rollator.  She reports some intermittent nausea but denies any URI symptoms, no diarrhea.  She has been taking all of her medicines as prescribed and has not had any major changes of late.  She does report though that her diuretic was increased after her last discharge and she has been taking as prescribed, also that she has been fluid restricting as advised to after her last admission as well.  In addition she describes hallucinating drawings on the wall as if children had been drawing randomly with an orange marker which last occurred last night.  In the ED vital signs notable for intermittent hypertension but otherwise unremarkable.  Labs showed borderline hyperkalemia of 5.2 and AKI with creatinine increased from  baseline of 1.1-1.8, also mild elevated bilirubin of 1.4 compared to 1.1 on last check.  Serial troponins were normal.  BNP was not elevated.  CBC showed white count of 15 and hemoglobin with stable anemia of 11.8 but otherwise unremarkable.  UA showed small leuks but no other acute findings.  EKG showed sinus rhythm without any acute ischemic changes.  CT head and CT abdomen and pelvis both without contrast were both unremarkable. Given 500 cc bolus of NS. Given AKI, physical deconditioning, she was admitted for further management.    Review of Systems:  Pertinent positives and negative per HPI, all others reviewed and negative  Past Medical History:  Diagnosis Date   Acute lymphadenitis 2011   ALLERGIC RHINITIS 08/10/2007   ANXIETY 12/06/2007   Atherosclerotic peripheral vascular disease (HCC) 06/13/2013   Aorta on CT June 2014   Cellulitis 2011   Cervical disc disease 03/09/2012   CHF (congestive heart failure) (HCC)    Chronic pain 03/09/2012   DEPRESSION 12/06/2007   Diabetes (HCC)    DIVERTICULOSIS, [place] 12/06/2007   GERD 12/06/2007   Hepatitis    age 39 hepatitis A   HLD (hyperlipidemia) 05/24/2019   HYPERTENSION 12/06/2007   Lumbar disc disease 03/09/2012   Mesenteric adenitis    MRSA 2006   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 2010  CERVICAL DISC SURGERY  may 24th 2010   COLONOSCOPY WITH PROPOFOL N/A 07/10/2016   Procedure: COLONOSCOPY WITH PROPOFOL;  Surgeon: Ruffin Frederick, MD;  Location: [facility];  Service: Gastroenterology;  Laterality: N/A;   COLONOSCOPY WITH PROPOFOL N/A 09/26/2019   Procedure: COLONOSCOPY WITH PROPOFOL;  Surgeon: Benancio Deeds, MD;  Location: [facility];  Service: Gastroenterology;  Laterality: N/A;    ESOPHAGOGASTRODUODENOSCOPY (EGD) WITH PROPOFOL N/A 07/10/2016   Procedure: ESOPHAGOGASTRODUODENOSCOPY (EGD) WITH PROPOFOL;  Surgeon: Ruffin Frederick, MD;  Location: [facility];  Service: Gastroenterology;  Laterality: N/A;   POLYPECTOMY  09/26/2019   Procedure: POLYPECTOMY;  Surgeon: Benancio Deeds, MD;  Location: [facility];  Service: Gastroenterology;;   RADIOLOGY WITH ANESTHESIA N/A 07/30/2023   Procedure: MRI WITH ANESTHESIA - THORACIC LUMBAR WITHOUT CONSTRAST;  Surgeon: Radiologist, Medication, MD;  Location: [facility] OR;  Service: Radiology;  Laterality: N/A;   s/p ovary cyst     s/p right knee arthroscopy     Dr. Dion Saucier ortho   Social History:  reports that she quit smoking about 14 years ago. Her smoking use included cigarettes. She started smoking about 44 years ago. She has never used smokeless tobacco. She reports that she does not drink alcohol and does not use drugs.  Allergies  Allergen Reactions   Amoxicillin-Pot Clavulanate Nausea And Vomiting    Projectile vomiting Did it involve swelling of the face/tongue/throat, SOB, or low BP? No Did it involve sudden or severe rash/hives, skin peeling, or any reaction on the inside of your mouth or nose? No Did you need to seek medical attention at a hospital or doctor's office? No When did it last happen?      20-30 years If all above answers are "NO", may proceed with cephalosporin use.    Adhesive [Tape] Other (See Comments)    Tears skin off - use paper tape    Codeine Other (See Comments)    hallucinations   Crestor [Rosuvastatin Calcium]     Severe muscle cramps   Gabapentin Other (See Comments)    Trembling   Lyrica [Pregabalin] Other (See Comments)    weakness   Morphine And Codeine Nausea And Vomiting and Other (See Comments)    Migraine headaches   Vicodin [Hydrocodone-Acetaminophen] Other (See Comments)    hallucinations   Latex Rash    Family History  Problem Relation Age of Onset   Heart disease  Mother    Hypertension Mother    Diabetes Mother    Heart failure Mother    Asthma Sister    Anxiety disorder Sister    Depression Sister    Hypertension Father    Asthma Daughter    Bipolar disorder Daughter    Cancer Maternal Uncle        [place]   Cancer Other        ovarian   Liver cancer Paternal Grandmother        ????    Prior to Admission medications   Medication Sig Start Date End Date Taking? Authorizing Provider  Blood Glucose Monitoring Suppl (ACCU-CHEK GUIDE) w/Device KIT Use as directed Patient taking differently: 1 each by Other route as directed. 04/11/21   Zigmund Daniel., MD  cholecalciferol (VITAMIN D3) 25 MCG (1000 UNIT) tablet Take 2,000 Units by mouth daily.    [provider]  clonazePAM (KLONOPIN) 1 MG tablet TAKE ONE TABLET BY MOUTH TWICE DAILY AS NEEDED FOR ANXIETY Patient taking differently: Take 1 mg by mouth 2 (two) times  daily as needed for anxiety. for anxiety 06/08/23   Corwin Levins, MD  cyanocobalamin 1000 MCG tablet Take 1 tablet (1,000 mcg total) by mouth daily. 07/18/23   Alicia Amel, MD  DULoxetine (CYMBALTA) 20 MG capsule Take 20 mg by mouth daily.    [provider]  fluticasone (FLONASE) 50 MCG/ACT nasal spray Place 1 spray into both nostrils daily. 12/07/22   [provider]  glucose blood (ACCU-CHEK GUIDE) test strip Use as instructed up to 4 times daily 04/11/21   Zigmund Daniel., MD  Metamucil Fiber CHEW Chew 3 tablets by mouth at bedtime.    [provider]  metoprolol succinate (TOPROL-XL) 25 MG 24 hr tablet Take 1 tablet (25 mg total) by mouth daily. 07/18/23   Alicia Amel, MD  MOUNJARO 10 MG/0.5ML Pen Inject 10 mg into the skin once a week. 06/29/23   [provider]  NARCAN 4 MG/0.1ML LIQD nasal spray kit Place 0.4 mg into the nose daily as needed (opioid overdose). 04/25/19   [provider]  Oxycodone HCl 10 MG TABS Take 1 tablet (10 mg total) 4 (four) times daily by  mouth. Per [facility] Patient taking differently: Take 10 mg by mouth every 4 (four) hours as needed (pain). Per [facility] 11/08/17   Corwin Levins, MD  pantoprazole (PROTONIX) 40 MG tablet TAKE ONE TABLET BY MOUTH DAILY Patient taking differently: Take 40 mg by mouth daily. 01/29/22   Corwin Levins, MD  potassium chloride SA (KLOR-CON M) 20 MEQ tablet Take 2 tablets (40 mEq total) by mouth 2 (two) times daily. 07/17/23   Cyndia Skeeters, DO  pramipexole (MIRAPEX) 0.5 MG tablet Take 1 tablet (0.5 mg total) by mouth 3 (three) times daily. 06/24/22   Corwin Levins, MD  spironolactone (ALDACTONE) 25 MG tablet Take 0.5 tablets (12.5 mg total) by mouth daily. Patient taking differently: Take 25 mg by mouth daily. 08/14/22   Sharlene Dory, NP  torsemide (DEMADEX) 20 MG tablet Take 2 tablets (40 mg total) by mouth daily. 07/17/23   Alicia Amel, MD    Physical Exam: Vitals:   10/09/23 1430 10/09/23 1441 10/09/23 1630 10/09/23 1832  BP: (!) 155/114  (!) 124/57   Pulse:   90   Resp: 19  (!) 22   Temp:  97.6 F (36.4 C)  98.8 F (37.1 C)  TempSrc:  Oral  Oral  SpO2:   96%   Weight:      Height:       Physical Exam Constitutional:      General: She is not in acute distress.    Appearance: She is obese. She is not toxic-appearing or diaphoretic.  HENT:     Head: Normocephalic and atraumatic.     Mouth/Throat:     Mouth: Mucous membranes are dry.  Eyes:     General: No scleral icterus. Cardiovascular:     Rate and Rhythm: Normal rate and regular rhythm.     Comments: Heart sounds distant, difficult to auscultate Pulmonary:     Effort: Pulmonary effort is normal. No respiratory distress.     Breath sounds: Normal breath sounds. No wheezing or rales.  Abdominal:     General: Bowel sounds are normal.     Palpations: Abdomen is soft.     Tenderness: There is no abdominal tenderness.  Musculoskeletal:     Right lower leg: Edema present.     Left lower leg: Edema  present.     Comments: Edematous lower extremities with diffuse venous stasis changes  Skin:    General: Skin is warm and dry.  Neurological:     Mental Status: She is alert.  Psychiatric:     Comments: Anxious affect. Reports +visual hallucinations      Data Reviewed: Results for orders placed or performed during the hospital encounter of 10/09/23 (from the past 24 hour(s))  Urinalysis, w/ Reflex to Culture (Infection Suspected) -Urine, Catheterized     Status: Abnormal   Collection Time: 10/09/23 11:00 AM  Result Value Ref Range   Specimen Source URINE, CATHETERIZED    Color, Urine YELLOW YELLOW   APPearance CLEAR CLEAR   Specific Gravity, Urine 1.018 1.005 - 1.030   pH 5.0 5.0 - 8.0   Glucose, UA NEGATIVE NEGATIVE mg/dL   Hgb urine dipstick NEGATIVE NEGATIVE   Bilirubin Urine NEGATIVE NEGATIVE   Ketones, ur NEGATIVE NEGATIVE mg/dL   Protein, ur NEGATIVE NEGATIVE mg/dL   Nitrite NEGATIVE NEGATIVE   Leukocytes,Ua SMALL (A) NEGATIVE   RBC / HPF 0-5 0 - 5 RBC/hpf   WBC, UA 11-20 0 - 5 WBC/hpf   Bacteria, UA RARE (A) NONE SEEN   Squamous Epithelial / HPF 0-5 0 - 5 /HPF   Mucus PRESENT    Hyaline Casts, UA PRESENT   Comprehensive metabolic panel     Status: Abnormal   Collection Time: 10/09/23 11:00 AM  Result Value Ref Range   Sodium 135 135 - 145 mmol/L   Potassium 5.2 (H) 3.5 - 5.1 mmol/L   Chloride 101 98 - 111 mmol/L   CO2 25 22 - 32 mmol/L   Glucose, Bld 98 70 - 99 mg/dL   BUN 18 8 - 23 mg/dL   Creatinine, Ser 7.82 (H) 0.44 - 1.00 mg/dL   Calcium 9.1 8.9 - 95.6 mg/dL   Total Protein 7.5 6.5 - 8.1 g/dL   Albumin 3.8 3.5 - 5.0 g/dL   AST 21 15 - 41 U/L   ALT 17 0 - 44 U/L   Alkaline Phosphatase 81 38 - 126 U/L   Total Bilirubin 1.4 (H) 0.3 - 1.2 mg/dL   GFR, Estimated 30 (L) >60 mL/min   Anion gap 9 5 - 15  Lipase, blood     Status: None   Collection Time: 10/09/23 11:00 AM  Result Value Ref Range   Lipase 26 11 - 51 U/L  CBC with Differential     Status:  Abnormal   Collection Time: 10/09/23 11:00 AM  Result Value Ref Range   WBC 15.3 (H) 4.0 - 10.5 K/uL   RBC 3.91 3.87 - 5.11 MIL/uL   Hemoglobin 11.8 (L) 12.0 - 15.0 g/dL   HCT 21.3 08.6 - 57.8 %   MCV 94.4 80.0 - 100.0 fL   MCH 30.2 26.0 - 34.0 pg   MCHC 32.0 30.0 - 36.0 g/dL   RDW 46.9 62.9 - 52.8 %   Platelets 310 150 - 400 K/uL   nRBC 0.0 0.0 - 0.2 %   Neutrophils Relative % 83 %   Neutro Abs 12.6 (H) 1.7 - 7.7 K/uL   Lymphocytes Relative 11 %   Lymphs Abs 1.7 0.7 - 4.0 K/uL   Monocytes Relative 5 %   Monocytes Absolute 0.8 0.1 - 1.0 K/uL   Eosinophils Relative 0 %   Eosinophils Absolute 0.0 0.0 - 0.5 K/uL   Basophils Relative 0 %   Basophils Absolute 0.1 0.0 - 0.1 K/uL  Immature Granulocytes 1 %   Abs Immature Granulocytes 0.09 (H) 0.00 - 0.07 K/uL  Troponin I (High Sensitivity)     Status: None   Collection Time: 10/09/23 11:00 AM  Result Value Ref Range   Troponin I (High Sensitivity) 9 <18 ng/L  SARS Coronavirus 2 by RT PCR (hospital order, performed in [facility] hospital lab) *cepheid single result test* Anterior Nasal Swab     Status: None   Collection Time: 10/09/23 11:00 AM   Specimen: Anterior Nasal Swab  Result Value Ref Range   SARS Coronavirus 2 by RT PCR NEGATIVE NEGATIVE  Brain natriuretic peptide     Status: None   Collection Time: 10/09/23 11:00 AM  Result Value Ref Range   B Natriuretic Peptide 20.8 0.0 - 100.0 pg/mL  Troponin I (High Sensitivity)     Status: None   Collection Time: 10/09/23  1:19 PM  Result Value Ref Range   Troponin I (High Sensitivity) 9 <18 ng/L   CT Head Wo Contrast CLINICAL DATA:  Mental status change, unknown cause.  EXAM: CT HEAD WITHOUT CONTRAST  TECHNIQUE: Contiguous axial images were obtained from the base of the skull through the vertex without intravenous contrast.  RADIATION DOSE REDUCTION: This exam was performed according to the departmental dose-optimization program which includes automated exposure  control, adjustment of the mA and/or kV according to patient size and/or use of iterative reconstruction technique.  COMPARISON:  CT scan head from 07/15/2023.  FINDINGS: Brain: No evidence of acute infarction, hemorrhage, hydrocephalus, extra-axial collection or mass lesion/mass effect.  Ventricles are normal. Cerebral volume is age appropriate.  Vascular: No hyperdense vessel or unexpected calcification. Intracranial arteriosclerosis.  Skull: Normal. Negative for fracture or focal lesion. Note is made of hyperostosis frontalis interna.  Sinuses/Orbits: No acute finding.  Other: Visualized mastoid air cells are unremarkable. No mastoid effusion.  IMPRESSION: *No acute intracranial abnormality.  Electronically Signed   By: Jules Schick M.D.   On: 10/09/2023 14:42 CT ABDOMEN PELVIS WO CONTRAST CLINICAL DATA:  Abdominal pain, acute, nonlocalized  EXAM: CT ABDOMEN AND PELVIS WITHOUT CONTRAST  TECHNIQUE: Multidetector CT imaging of the abdomen and pelvis was performed following the standard protocol without IV contrast.  RADIATION DOSE REDUCTION: This exam was performed according to the departmental dose-optimization program which includes automated exposure control, adjustment of the mA and/or kV according to patient size and/or use of iterative reconstruction technique.  COMPARISON:  CT scan abdomen and pelvis from 07/15/2023.  FINDINGS: Lower chest: There are patchy atelectatic changes in the visualized lung bases. No overt consolidation. No pleural effusion. The heart is normal in size. No pericardial effusion.  Hepatobiliary: The liver is normal in size. Non-cirrhotic configuration. No suspicious mass. No intrahepatic or extrahepatic bile duct dilation. No calcified gallstones. Normal gallbladder wall thickness. No pericholecystic inflammatory changes.  Pancreas: Unremarkable. No pancreatic ductal dilatation or surrounding inflammatory changes.  Spleen:  Within normal limits. No focal lesion.  Adrenals/Urinary Tract: Adrenal glands are unremarkable. No suspicious renal mass. No hydronephrosis. No renal or ureteric calculi. Unremarkable urinary bladder.  Stomach/Bowel: No disproportionate dilation of the small or large bowel loops. No evidence of abnormal bowel wall thickening or inflammatory changes. The appendix is unremarkable. There are scattered diverticula mainly in the sigmoid Stanczak, without imaging signs of diverticulitis.  Vascular/Lymphatic: No ascites or pneumoperitoneum. No abdominal or pelvic lymphadenopathy, by size criteria. No aneurysmal dilation of the major abdominal arteries. There are moderate peripheral atherosclerotic vascular calcifications of the aorta and its major branches.  Reproductive: The uterus is surgically absent. No large adnexal mass.  Other: There is a small fat containing periumbilical hernia. The soft tissues and abdominal wall are otherwise unremarkable.  Musculoskeletal: No suspicious osseous lesions. There are mild multilevel degenerative changes in the visualized spine.  IMPRESSION: 1. No acute findings in the abdomen or pelvis. 2. Small fat containing periumbilical hernia. 3. Multiple other nonacute observations, as described above.  Aortic Atherosclerosis (ICD10-I70.0).  Electronically Signed   By: Jules Schick M.D.   On: 10/09/2023 14:37 DG Chest Portable 1 View CLINICAL DATA:  Weakness.  EXAM: PORTABLE CHEST 1 VIEW  COMPARISON:  Two-view chest x-ray 08/30/2023  FINDINGS: The heart is enlarged. Atherosclerotic changes are present at the aortic arch. Mild pulmonary vascular congestion is present without frank edema. No focal airspace disease is present.  IMPRESSION: Cardiomegaly and mild pulmonary vascular congestion without frank edema.  Electronically Signed   By: Marin Roberts M.D.   On: 10/09/2023 12:19   Assessment and Plan: Caye Sollis Forand is a 67  y.o. female with medical history significant for chronic pain, morbid obesity, CKD 3A, anxiety/depression, hypertension, GERD, OSA, HFpEF, type 2 diabetes, who presents with weakness and is found to have an AKI.   * AKI (acute kidney injury) (HCC) Unclear etiology, though suspect increase in diuretic dosing as well as decreased p.o. intake are the main drivers.  In addition patient was recently prescribed Bactrim which is another potential cause.  Status post 500 cc bolus in the ED. defer further fluids at present given CHF. - Follow-up BMP - Hold diuretics (spironolactone and torsemide) - Bactrim discontinued  Physical deconditioning Has been an ongoing issue over her last several admissions. - TOC consult  Hallucination Unclear etiology but may be related to polypharmacy, has reported similar with use of narcotics in the past per review of allergy list.  She is also on pramipexole which is associated with psychotic side effects.  May warrant psychiatry evaluation for other organic causes of persistent.  Known medical problems Anxiety/depression-continue Klonopin Chronic pain/neuropathy-continue duloxetine CHF-continue metoprolol.  Hold spironolactone and torsemide. GERD--cont PPI, hold H2 blocker, unclear why she is on both DM2-hold Mounjaro, 4 times daily fingersticks with sliding scale correction factor insulin RLS-continue pramipexole, may need to consider discontinuation if hallucinations persist      Advance Care Planning:   Code Status: Full Code , confirmed with patient  Consults: None  Family Communication: None  Severity of Illness: The appropriate patient status for this patient is OBSERVATION. Observation status is judged to be reasonable and necessary in order to provide the required intensity of service to ensure the patient's safety. The patient's presenting symptoms, physical exam findings, and initial radiographic and laboratory data in the context of their medical  condition is felt to place them at decreased risk for further clinical deterioration. Furthermore, it is anticipated that the patient will be medically stable for discharge from the hospital within 2 midnights of admission.   Author: Venora Maples, MD 10/09/2023 6:35 PM  For on call review [email or web address].

## 2023-10-09 NOTE — Assessment & Plan Note (Addendum)
Unclear etiology, though suspect increase in diuretic dosing as well as decreased p.o. intake are the main drivers.  In addition patient was recently prescribed Bactrim which is another potential cause.  Status post 500 cc bolus in the ED. defer further fluids at present given CHF. - Follow-up BMP - Hold diuretics (spironolactone and torsemide) - Bactrim discontinued

## 2023-10-09 NOTE — Assessment & Plan Note (Addendum)
Unclear etiology but may be related to polypharmacy, has reported similar with use of narcotics in the past per review of allergy list.  She is also on pramipexole which is associated with psychotic side effects.  May warrant psychiatry evaluation for other organic causes of persistent.

## 2023-10-09 NOTE — ED Provider Notes (Signed)
[place] EMERGENCY DEPARTMENT AT [facility] Provider Note   CSN: [id number] Arrival date & time: 10/09/23  1020     History  Chief Complaint  Patient presents with   Hallucinations    Maria [name] is a 67 y.o. female.  HPI 67 year old female with multiple comorbidities which include but are not limited to CHF, diabetes, hypertension, anxiety, lymphedema, presents with weakness and hallucinations.  Patient provides the history.  The patient has been getting weaker since leaving the hospital.  She is usually able to walk with a walker.  However this morning she could not get to her walker and panicked and scream for help.  She states that she was also seeing drawings on the wall as if children had drawn on them and this reminded her of hallucinations that she had when she was in the hospital (though those were worse).  She denies a headache.  No chest pain.  She has acute on chronic abdominal pain and has been having chronic dysuria for the past few weeks.  Both legs feel weak to her.  Home Medications Prior to Admission medications   Medication Sig Start Date End Date Taking? Authorizing Provider  Blood Glucose Monitoring Suppl (ACCU-CHEK GUIDE) w/Device KIT Use as directed Patient taking differently: 1 each by Other route as directed. 04/11/21   Zigmund Daniel., MD  cholecalciferol (VITAMIN D3) 25 MCG (1000 UNIT) tablet Take 2,000 Units by mouth daily.    [provider]  clonazePAM (KLONOPIN) 1 MG tablet TAKE ONE TABLET BY MOUTH TWICE DAILY AS NEEDED FOR ANXIETY Patient taking differently: Take 1 mg by mouth 2 (two) times daily as needed for anxiety. for anxiety 06/08/23   Corwin Levins, MD  cyanocobalamin 1000 MCG tablet Take 1 tablet (1,000 mcg total) by mouth daily. 07/18/23   Alicia Amel, MD  DULoxetine (CYMBALTA) 20 MG capsule Take 20 mg by mouth daily.    [provider]  fluticasone (FLONASE) 50 MCG/ACT nasal spray Place 1 spray into  both nostrils daily. 12/07/22   [provider]  glucose blood (ACCU-CHEK GUIDE) test strip Use as instructed up to 4 times daily 04/11/21   Zigmund Daniel., MD  Metamucil Fiber CHEW Chew 3 tablets by mouth at bedtime.    [provider]  metoprolol succinate (TOPROL-XL) 25 MG 24 hr tablet Take 1 tablet (25 mg total) by mouth daily. 07/18/23   Alicia Amel, MD  MOUNJARO 10 MG/0.5ML Pen Inject 10 mg into the skin once a week. 06/29/23   [provider]  NARCAN 4 MG/0.1ML LIQD nasal spray kit Place 0.4 mg into the nose daily as needed (opioid overdose). 04/25/19   [provider]  Oxycodone HCl 10 MG TABS Take 1 tablet (10 mg total) 4 (four) times daily by mouth. Per [facility] Patient taking differently: Take 10 mg by mouth every 4 (four) hours as needed (pain). Per [facility] 11/08/17   Corwin Levins, MD  pantoprazole (PROTONIX) 40 MG tablet TAKE ONE TABLET BY MOUTH DAILY Patient taking differently: Take 40 mg by mouth daily. 01/29/22   Corwin Levins, MD  potassium chloride SA (KLOR-CON M) 20 MEQ tablet Take 2 tablets (40 mEq total) by mouth 2 (two) times daily. 07/17/23   Cyndia Skeeters, DO  pramipexole (MIRAPEX) 0.5 MG tablet Take 1 tablet (0.5 mg total) by mouth 3 (three) times daily. 06/24/22   Corwin Levins, MD  spironolactone (ALDACTONE) 25  MG tablet Take 0.5 tablets (12.5 mg total) by mouth daily. Patient taking differently: Take 25 mg by mouth daily. 08/14/22   Sharlene Dory, NP  torsemide (DEMADEX) 20 MG tablet Take 2 tablets (40 mg total) by mouth daily. 07/17/23   Alicia Amel, MD      Allergies    Amoxicillin-pot clavulanate, Adhesive [tape], Codeine, Crestor [rosuvastatin calcium], Lyrica [pregabalin], Morphine and codeine, Vicodin [hydrocodone-acetaminophen], and Latex    Review of Systems   Review of Systems  Respiratory:  Negative for shortness of breath.   Cardiovascular:  Negative for chest pain.  Gastrointestinal:   Positive for abdominal pain.  Genitourinary:  Positive for dysuria.  Neurological:  Positive for weakness. Negative for headaches.  Psychiatric/Behavioral:  Positive for hallucinations.     Physical Exam Updated Vital Signs BP (!) 120/110   Pulse 87   Temp 97.6 F (36.4 C) (Oral)   Resp 20   Ht 5\' 3"  (1.6 m)   Wt 136 kg   SpO2 100%   BMI 53.11 kg/m  Physical Exam Vitals and nursing note reviewed.  Constitutional:      Appearance: She is well-developed. She is obese.  HENT:     Head: Normocephalic and atraumatic.  Eyes:     Extraocular Movements: Extraocular movements intact.     Pupils: Pupils are equal, round, and reactive to light.  Cardiovascular:     Rate and Rhythm: Normal rate and regular rhythm.     Heart sounds: Normal heart sounds.  Pulmonary:     Effort: Pulmonary effort is normal.     Breath sounds: Normal breath sounds.  Abdominal:     Palpations: Abdomen is soft.     Tenderness: There is generalized abdominal tenderness.  Skin:    General: Skin is warm and dry.     Comments: Bilateral stasis dermatitis to both legs  Neurological:     Mental Status: She is alert and oriented to person, place, and time.     Comments: Awake, alert, oriented to person, place time. No actively hallucinating. CN 3-12 grossly intact. 5/5 strength in both upper extremities. Basically unable to lift either leg off the stretcher.     ED Results / Procedures / Treatments   Labs (all labs ordered are listed, but only abnormal results are displayed) Labs Reviewed  COMPREHENSIVE METABOLIC PANEL - Abnormal; Notable for the following components:      Result Value   Potassium 5.2 (*)    Creatinine, Ser 1.85 (*)    Total Bilirubin 1.4 (*)    GFR, Estimated 30 (*)    All other components within normal limits  CBC WITH DIFFERENTIAL/PLATELET - Abnormal; Notable for the following components:   WBC 15.3 (*)    Hemoglobin 11.8 (*)    Neutro Abs 12.6 (*)    Abs Immature Granulocytes  0.09 (*)    All other components within normal limits  SARS CORONAVIRUS 2 BY RT PCR  LIPASE, BLOOD  BRAIN NATRIURETIC PEPTIDE  URINALYSIS, W/ REFLEX TO CULTURE (INFECTION SUSPECTED)  TROPONIN I (HIGH SENSITIVITY)  TROPONIN I (HIGH SENSITIVITY)    EKG EKG Interpretation Date/Time:  Saturday October 09 2023 10:34:01 EDT Ventricular Rate:  98 PR Interval:  155 QRS Duration:  102 QT Interval:  346 QTC Calculation: 442 R Axis:   72  Text Interpretation: Sinus rhythm no significant change since Sept 2024 Confirmed by Pricilla Loveless [phone]) on 10/09/2023 10:59:11 AM  Radiology CT Head Wo Contrast  Result Date: 10/09/2023 CLINICAL DATA:  Mental status change, unknown cause. EXAM: CT HEAD WITHOUT CONTRAST TECHNIQUE: Contiguous axial images were obtained from the base of the skull through the vertex without intravenous contrast. RADIATION DOSE REDUCTION: This exam was performed according to the departmental dose-optimization program which includes automated exposure control, adjustment of the mA and/or kV according to patient size and/or use of iterative reconstruction technique. COMPARISON:  CT scan head from 07/15/2023. FINDINGS: Brain: No evidence of acute infarction, hemorrhage, hydrocephalus, extra-axial collection or mass lesion/mass effect. Ventricles are normal. Cerebral volume is age appropriate. Vascular: No hyperdense vessel or unexpected calcification. Intracranial arteriosclerosis. Skull: Normal. Negative for fracture or focal lesion. Note is made of hyperostosis frontalis interna. Sinuses/Orbits: No acute finding. Other: Visualized mastoid air cells are unremarkable. No mastoid effusion. IMPRESSION: *No acute intracranial abnormality. Electronically Signed   By: Jules Schick M.D.   On: 10/09/2023 14:42   CT ABDOMEN PELVIS WO CONTRAST  Result Date: 10/09/2023 CLINICAL DATA:  Abdominal pain, acute, nonlocalized EXAM: CT ABDOMEN AND PELVIS WITHOUT CONTRAST TECHNIQUE: Multidetector  CT imaging of the abdomen and pelvis was performed following the standard protocol without IV contrast. RADIATION DOSE REDUCTION: This exam was performed according to the departmental dose-optimization program which includes automated exposure control, adjustment of the mA and/or kV according to patient size and/or use of iterative reconstruction technique. COMPARISON:  CT scan abdomen and pelvis from 07/15/2023. FINDINGS: Lower chest: There are patchy atelectatic changes in the visualized lung bases. No overt consolidation. No pleural effusion. The heart is normal in size. No pericardial effusion. Hepatobiliary: The liver is normal in size. Non-cirrhotic configuration. No suspicious mass. No intrahepatic or extrahepatic bile duct dilation. No calcified gallstones. Normal gallbladder wall thickness. No pericholecystic inflammatory changes. Pancreas: Unremarkable. No pancreatic ductal dilatation or surrounding inflammatory changes. Spleen: Within normal limits. No focal lesion. Adrenals/Urinary Tract: Adrenal glands are unremarkable. No suspicious renal mass. No hydronephrosis. No renal or ureteric calculi. Unremarkable urinary bladder. Stomach/Bowel: No disproportionate dilation of the small or large bowel loops. No evidence of abnormal bowel wall thickening or inflammatory changes. The appendix is unremarkable. There are scattered diverticula mainly in the sigmoid Fleites, without imaging signs of diverticulitis. Vascular/Lymphatic: No ascites or pneumoperitoneum. No abdominal or pelvic lymphadenopathy, by size criteria. No aneurysmal dilation of the major abdominal arteries. There are moderate peripheral atherosclerotic vascular calcifications of the aorta and its major branches. Reproductive: The uterus is surgically absent. No large adnexal mass. Other: There is a small fat containing periumbilical hernia. The soft tissues and abdominal wall are otherwise unremarkable. Musculoskeletal: No suspicious osseous  lesions. There are mild multilevel degenerative changes in the visualized spine. IMPRESSION: 1. No acute findings in the abdomen or pelvis. 2. Small fat containing periumbilical hernia. 3. Multiple other nonacute observations, as described above. Aortic Atherosclerosis (ICD10-I70.0). Electronically Signed   By: Jules Schick M.D.   On: 10/09/2023 14:37   DG Chest Portable 1 View  Result Date: 10/09/2023 CLINICAL DATA:  Weakness. EXAM: PORTABLE CHEST 1 VIEW COMPARISON:  Two-view chest x-ray 08/30/2023 FINDINGS: The heart is enlarged. Atherosclerotic changes are present at the aortic arch. Mild pulmonary vascular congestion is present without frank edema. No focal airspace disease is present. IMPRESSION: Cardiomegaly and mild pulmonary vascular congestion without frank edema. Electronically Signed   By: Marin Roberts M.D.   On: 10/09/2023 12:19    Procedures Procedures    Medications Ordered in ED Medications  sodium chloride 0.9 % bolus 500 mL (0 mLs Intravenous Stopped 10/09/23 1442)  oxyCODONE (Oxy IR/ROXICODONE) immediate  release tablet 10 mg (10 mg Oral Given 10/09/23 1448)    ED Course/ Medical Decision Making/ A&P                                 Medical Decision Making Amount and/or Complexity of Data Reviewed Labs: ordered.    Details: AKI Radiology: ordered and independent interpretation performed.    Details: No bowel obstruction ECG/medicine tests: ordered and independent interpretation performed.    Details: No acute ischemia  Risk Prescription drug management.   Patient has an AKI. Given some gentle fluids due to history of CHF.  Abdominal and head CT are unremarkable.  Urine obtained and ultimately will need admission for AKI.  Vital signs are reassuring. Care transferred to Dr. Estell Harpin.        Final Clinical Impression(s) / ED Diagnoses Final diagnoses:  None    Rx / DC Orders ED Discharge Orders     None         Pricilla Loveless,  MD 10/09/23 1515

## 2023-10-09 NOTE — ED Notes (Signed)
Unable to in and out pt, provider notified.

## 2023-10-09 NOTE — ED Triage Notes (Signed)
PT BIBA for hallucinations.  EMS states a neighbor of patient called 911 because the patient was screaming.  Police found patient A&O x4 but having hallucinations.  Police state this has happened before when patient took the wrong medication.   Pt endorses frequency, burning with urination, and lower back pain midline radiating

## 2023-10-09 NOTE — ED Notes (Signed)
Placed pt on bed pan.

## 2023-10-09 NOTE — Assessment & Plan Note (Addendum)
Anxiety/depression-continue Klonopin Chronic pain/neuropathy-continue duloxetine CHF-continue metoprolol.  Hold spironolactone and torsemide. GERD--cont PPI, hold H2 blocker, unclear why she is on both DM2-hold Mounjaro, 4 times daily fingersticks with sliding scale correction factor insulin RLS-continue pramipexole, may need to consider discontinuation if hallucinations persist

## 2023-10-09 NOTE — Assessment & Plan Note (Signed)
Patient continues to complain of severe generalized weakness contributing to her repeated presentations and hospitalizations over the past year  During the last hospitalization in September patient was slated to be discharged to skilled nursing facility however this failed and patient eventually had to be discharged home with home health services  Will obtain PT/OT evaluation  Will discuss options with TOC in the morning  TOC consult placed

## 2023-10-10 DIAGNOSIS — I5032 Chronic diastolic (congestive) heart failure: Secondary | ICD-10-CM

## 2023-10-10 DIAGNOSIS — E1122 Type 2 diabetes mellitus with diabetic chronic kidney disease: Secondary | ICD-10-CM

## 2023-10-10 DIAGNOSIS — R5381 Other malaise: Secondary | ICD-10-CM | POA: Diagnosis not present

## 2023-10-10 DIAGNOSIS — I890 Lymphedema, not elsewhere classified: Secondary | ICD-10-CM | POA: Diagnosis not present

## 2023-10-10 DIAGNOSIS — N179 Acute kidney failure, unspecified: Secondary | ICD-10-CM | POA: Diagnosis not present

## 2023-10-10 DIAGNOSIS — N1831 Chronic kidney disease, stage 3a: Secondary | ICD-10-CM

## 2023-10-10 DIAGNOSIS — G894 Chronic pain syndrome: Secondary | ICD-10-CM

## 2023-10-10 DIAGNOSIS — R443 Hallucinations, unspecified: Secondary | ICD-10-CM

## 2023-10-10 DIAGNOSIS — K219 Gastro-esophageal reflux disease without esophagitis: Secondary | ICD-10-CM

## 2023-10-10 LAB — GLUCOSE, CAPILLARY
Glucose-Capillary: 86 mg/dL (ref 70–99)
Glucose-Capillary: 94 mg/dL (ref 70–99)
Glucose-Capillary: 121 mg/dL — ABNORMAL HIGH (ref 70–99)
Glucose-Capillary: 107 mg/dL — ABNORMAL HIGH (ref 70–99)

## 2023-10-10 LAB — CBC
HCT: 32.3 % — ABNORMAL LOW (ref 36.0–46.0)
Hemoglobin: 10.1 g/dL — ABNORMAL LOW (ref 12.0–15.0)
MCH: 29.8 pg (ref 26.0–34.0)
MCHC: 31.3 g/dL (ref 30.0–36.0)
MCV: 95.3 fL (ref 80.0–100.0)
Platelets: 230 10*3/uL (ref 150–400)
RBC: 3.39 MIL/uL — ABNORMAL LOW (ref 3.87–5.11)
RDW: 13.6 % (ref 11.5–15.5)
WBC: 6.9 10*3/uL (ref 4.0–10.5)
nRBC: 0 % (ref 0.0–0.2)

## 2023-10-10 LAB — BASIC METABOLIC PANEL
Anion gap: 7 (ref 5–15)
BUN: 16 mg/dL (ref 8–23)
CO2: 27 mmol/L (ref 22–32)
Calcium: 8.6 mg/dL — ABNORMAL LOW (ref 8.9–10.3)
Chloride: 104 mmol/L (ref 98–111)
Creatinine, Ser: 1.43 mg/dL — ABNORMAL HIGH (ref 0.44–1.00)
GFR, Estimated: 40 mL/min — ABNORMAL LOW (ref >60)
Glucose, Bld: 96 mg/dL (ref 70–99)
Potassium: 4 mmol/L (ref 3.5–5.1)
Sodium: 138 mmol/L (ref 135–145)

## 2023-10-10 MED ORDER — CHLORHEXIDINE GLUCONATE CLOTH 2 % EX PADS
6.0000 | MEDICATED_PAD | Freq: Every day | CUTANEOUS | Status: DC
  Administered 2023-10-10 – 2023-10-14 (×5): 6 via TOPICAL

## 2023-10-10 MED ORDER — ENOXAPARIN SODIUM 80 MG/0.8ML IJ SOSY
75.0000 mg | PREFILLED_SYRINGE | INTRAMUSCULAR | Status: DC
  Administered 2023-10-10 – 2023-10-13 (×4): 75 mg via SUBCUTANEOUS
  Filled 2023-10-10 (×4): qty 0.8

## 2023-10-10 NOTE — Plan of Care (Signed)
  Problem: Education: Goal: Ability to describe self-care measures that may prevent or decrease complications (Diabetes Survival Skills Education) will improve Outcome: Progressing   Problem: Coping: Goal: Ability to adjust to condition or change in health will improve Outcome: Progressing   Problem: Fluid Volume: Goal: Ability to maintain a balanced intake and output will improve Outcome: Progressing   Problem: Health Behavior/Discharge Planning: Goal: Ability to identify and utilize available resources and services will improve Outcome: Progressing Goal: Ability to manage health-related needs will improve Outcome: Progressing   Problem: Metabolic: Goal: Ability to maintain appropriate glucose levels will improve Outcome: Progressing   Problem: Nutritional: Goal: Maintenance of adequate nutrition will improve Outcome: Progressing   Problem: Skin Integrity: Goal: Risk for impaired skin integrity will decrease Outcome: Progressing   Problem: Tissue Perfusion: Goal: Adequacy of tissue perfusion will improve Outcome: Progressing   Problem: Education: Goal: Knowledge of General Education information will improve Description: Including pain rating scale, medication(s)/side effects and non-pharmacologic comfort measures Outcome: Progressing   Problem: Health Behavior/Discharge Planning: Goal: Ability to manage health-related needs will improve Outcome: Progressing   Problem: Clinical Measurements: Goal: Ability to maintain clinical measurements within normal limits will improve Outcome: Progressing Goal: Will remain free from infection Outcome: Progressing Goal: Diagnostic test results will improve Outcome: Progressing Goal: Respiratory complications will improve Outcome: Progressing Goal: Cardiovascular complication will be avoided Outcome: Progressing   Problem: Activity: Goal: Risk for activity intolerance will decrease Outcome: Progressing   Problem:  Elimination: Goal: Will not experience complications related to urinary retention Outcome: Progressing   Problem: Pain Managment: Goal: General experience of comfort will improve Outcome: Progressing   Problem: Safety: Goal: Ability to remain free from injury will improve Outcome: Progressing   Problem: Skin Integrity: Goal: Risk for impaired skin integrity will decrease Outcome: Progressing

## 2023-10-10 NOTE — Plan of Care (Signed)

## 2023-10-11 DIAGNOSIS — N179 Acute kidney failure, unspecified: Secondary | ICD-10-CM | POA: Diagnosis not present

## 2023-10-11 DIAGNOSIS — N1831 Chronic kidney disease, stage 3a: Secondary | ICD-10-CM | POA: Diagnosis not present

## 2023-10-11 LAB — COMPREHENSIVE METABOLIC PANEL
ALT: 14 U/L (ref 0–44)
AST: 16 U/L (ref 15–41)
Albumin: 3.2 g/dL — ABNORMAL LOW (ref 3.5–5.0)
Alkaline Phosphatase: 67 U/L (ref 38–126)
Anion gap: 9 (ref 5–15)
BUN: 12 mg/dL (ref 8–23)
CO2: 26 mmol/L (ref 22–32)
Calcium: 8.2 mg/dL — ABNORMAL LOW (ref 8.9–10.3)
Chloride: 100 mmol/L (ref 98–111)
Creatinine, Ser: 1.17 mg/dL — ABNORMAL HIGH (ref 0.44–1.00)
GFR, Estimated: 51 mL/min — ABNORMAL LOW (ref >60)
Glucose, Bld: 100 mg/dL — ABNORMAL HIGH (ref 70–99)
Potassium: 3.9 mmol/L (ref 3.5–5.1)
Sodium: 135 mmol/L (ref 135–145)
Total Bilirubin: 1.3 mg/dL — ABNORMAL HIGH (ref 0.3–1.2)
Total Protein: 6.1 g/dL — ABNORMAL LOW (ref 6.5–8.1)

## 2023-10-11 LAB — CBC WITH DIFFERENTIAL/PLATELET
Abs Immature Granulocytes: 0.04 10*3/uL (ref 0.00–0.07)
Basophils Absolute: 0 10*3/uL (ref 0.0–0.1)
Basophils Relative: 0 %
Eosinophils Absolute: 0.2 10*3/uL (ref 0.0–0.5)
Eosinophils Relative: 2 %
HCT: 35.1 % — ABNORMAL LOW (ref 36.0–46.0)
Hemoglobin: 10.7 g/dL — ABNORMAL LOW (ref 12.0–15.0)
Immature Granulocytes: 0 %
Lymphocytes Relative: 23 %
Lymphs Abs: 2.1 10*3/uL (ref 0.7–4.0)
MCH: 29.7 pg (ref 26.0–34.0)
MCHC: 30.5 g/dL (ref 30.0–36.0)
MCV: 97.5 fL (ref 80.0–100.0)
Monocytes Absolute: 0.5 10*3/uL (ref 0.1–1.0)
Monocytes Relative: 6 %
Neutro Abs: 6.5 10*3/uL (ref 1.7–7.7)
Neutrophils Relative %: 69 %
Platelets: 253 10*3/uL (ref 150–400)
RBC: 3.6 MIL/uL — ABNORMAL LOW (ref 3.87–5.11)
RDW: 13.2 % (ref 11.5–15.5)
WBC: 9.4 10*3/uL (ref 4.0–10.5)
nRBC: 0 % (ref 0.0–0.2)

## 2023-10-11 LAB — MAGNESIUM: Magnesium: 2.3 mg/dL (ref 1.7–2.4)

## 2023-10-11 LAB — GLUCOSE, CAPILLARY
Glucose-Capillary: 81 mg/dL (ref 70–99)
Glucose-Capillary: 125 mg/dL — ABNORMAL HIGH (ref 70–99)
Glucose-Capillary: 91 mg/dL (ref 70–99)
Glucose-Capillary: 95 mg/dL (ref 70–99)

## 2023-10-11 LAB — VITAMIN B12: Vitamin B-12: 217 pg/mL (ref 180–914)

## 2023-10-11 LAB — URINE CULTURE: Culture: NO GROWTH

## 2023-10-11 MED ORDER — OXYCODONE HCL 10 MG PO TABS: 10.0000 mg | ORAL_TABLET | Freq: Four times a day (QID) | ORAL | 0 refills | Status: DC

## 2023-10-11 MED ORDER — CLONAZEPAM 1 MG PO TABS: 1.0000 mg | ORAL_TABLET | Freq: Two times a day (BID) | ORAL | 0 refills | Status: DC | PRN

## 2023-10-11 MED ORDER — TORSEMIDE 20 MG PO TABS: 20.0000 mg | ORAL_TABLET | Freq: Every day | ORAL | 0 refills | Status: DC

## 2023-10-11 NOTE — Discharge Summary (Incomplete)
Physician Discharge Summary  Maria [name] ZOX:[id number] DOB: 10-19-1956 DOA: 10/09/2023  PCP: Ignatius Specking, NP  Admit date: 10/09/2023 Discharge date: 10/11/2023 30 Day Unplanned Readmission Risk Score    Flowsheet Row ED to Hosp-Admission (Discharged) from 08/30/2023 in [facility]  30 Day Unplanned Readmission Risk Score (%) 28 Filed at 09/06/2023 2000       This score is the patient's risk of an unplanned readmission within 30 days of being discharged (0 -100%). The score is based on dignosis, age, lab data, medications, orders, and past utilization.   Low:  0-14.9   Medium: 15-21.9   High: 22-29.9   Extreme: 30 and above          Admitted From: Home Disposition:  ***  Recommendations for Outpatient Follow-up:  Follow up with PCP in 1-2 weeks Please obtain BMP/CBC in one week Please follow up with your PCP on the following pending results: Unresulted Labs (From admission, onward)     Start     Ordered   10/16/23 0500  Creatinine, serum  (enoxaparin (LOVENOX)    CrCl >/= 30 ml/min)  Weekly,   R     Comments: while on enoxaparin therapy    10/09/23 1827              Home Health:***  Equipment/Devices: None  Discharge Condition: Stable CODE STATUS: Full code Diet recommendation: Cardiac  Subjective: Seen and examined.  She has no complaints.  She prefers to go to SNF.  Brief/Interim Summary: 67 year old female with past medical history of chronic kidney disease stage IIIa, lymphedema, hypertension, gastroesophageal reflux disease, obstructive sleep apnea, diastolic congestive heart failure (Echo 01/2023 EF 65-70% with G2DD) who presented to [facility] emergency department with generalized weakness and reports of intermittent hallucinations.   Of note, patient was recently admitted to [facility] from 9/9 until 9/16 for generalized weakness and worsening bilateral leg pain.  Attempts to disposition the patient to  a skilled nursing facility after this hospitalization failed and patient was instead discharged home with home health services.   On this presentation, patient was noted to have acute kidney injury with creatinine of 1.85 (baseline 1.1 to 1.2).  Details below.   * Acute renal failure superimposed on stage 3a chronic kidney disease (HCC) Improved and back to baseline of 1.2 after IV hydration.  Perhaps she is overdiuresed and based on that, we are reducing her diuretic dose at the time of discharge as below.   Physical deconditioning Patient continues to complain of severe generalized weakness contributing to her repeated presentations and hospitalizations over the past year  During the last hospitalization in September patient was slated to be discharged to skilled nursing facility however this failed because patient had to pay some co-pay out-of-pocket which she could not afford and patient eventually had to be discharged home with home health services.  PT OT yet again recommended SNF. ***   Chronic diastolic CHF (congestive heart failure) (HCC)/chronic lymphedema Patient exhibits bilateral lower extremity edema however this is chronic due to known lymphedema, reducing her diuretic dose as mentioned above.   Type 2 diabetes mellitus with stage 3a chronic kidney disease, without long-term current use of insulin (HCC) Hemoglobin A1c 7/25 6.0% Resume Mounjaro at discharge. Accu-Cheks before every meal and nightly with sliding scale insulin   Hallucinations Patient reports hallucinations prior to arrival however patient has not exhibited any such symptoms since admission.  Follow-up with psychiatry as outpatient  for possible adjustment of her medications.  Resuming all PTA medications.   Chronic pain syndrome Resume all PTA medications.   GERD without esophagitis Continue Protonix  Discharge plan was discussed with patient and/or family member and they verbalized understanding and agreed  with it.  Discharge Diagnoses:  Principal Problem:   Acute renal failure superimposed on stage 3a chronic kidney disease (HCC) Active Problems:   Physical deconditioning   Lymphedema   Chronic diastolic CHF (congestive heart failure) (HCC)   Type 2 diabetes mellitus with stage 3a chronic kidney disease, without long-term current use of insulin (HCC)   Hallucinations   Chronic pain syndrome   GERD without esophagitis    Discharge Instructions   Allergies as of 10/11/2023       Reactions   Amoxicillin-pot Clavulanate Nausea And Vomiting   Projectile vomiting Did it involve swelling of the face/tongue/throat, SOB, or low BP? No Did it involve sudden or severe rash/hives, skin peeling, or any reaction on the inside of your mouth or nose? No Did you need to seek medical attention at a hospital or doctor's office? No When did it last happen?      20-30 years If all above answers are "NO", may proceed with cephalosporin use.   Adhesive [tape] Other (See Comments)   Tears skin off - use paper tape    Codeine Other (See Comments)   hallucinations   Crestor [rosuvastatin Calcium]    Severe muscle cramps   Gabapentin Other (See Comments)   Trembling   Lyrica [pregabalin] Other (See Comments)   weakness   Morphine And Codeine Nausea And Vomiting, Other (See Comments)   Migraine headaches   Vicodin [hydrocodone-acetaminophen] Other (See Comments)   hallucinations   Latex Rash        Medication List     STOP taking these medications    sulfamethoxazole-trimethoprim 800-160 MG tablet Commonly known as: BACTRIM DS       TAKE these medications    Accu-Chek Guide test strip Generic drug: glucose blood Use as instructed up to 4 times daily   Accu-Chek Guide w/Device Kit Use as directed What changed:  how much to take how to take this when to take this additional instructions   albuterol 108 (90 Base) MCG/ACT inhaler Commonly known as: VENTOLIN HFA Inhale 1 puff  into the lungs every 6 (six) hours as needed for wheezing or shortness of breath.   cholecalciferol 25 MCG (1000 UNIT) tablet Commonly known as: VITAMIN D3 Take 2,000 Units by mouth daily.   clonazePAM 1 MG tablet Commonly known as: KLONOPIN Take 1 tablet (1 mg total) by mouth 2 (two) times daily as needed. for anxiety What changed: reasons to take this   cyanocobalamin 1000 MCG tablet Commonly known as: VITAMIN B12 Take 1 tablet (1,000 mcg total) by mouth daily.   DULoxetine 20 MG capsule Commonly known as: CYMBALTA Take 20 mg by mouth daily.   famotidine 40 MG tablet Commonly known as: PEPCID Take 40 mg by mouth daily.   fluticasone 50 MCG/ACT nasal spray Commonly known as: FLONASE Place 1 spray into both nostrils daily.   Metamucil Fiber Chew Chew 3 tablets by mouth at bedtime.   metoprolol succinate 25 MG 24 hr tablet Commonly known as: TOPROL-XL Take 1 tablet (25 mg total) by mouth daily.   Mounjaro 10 MG/0.5ML Pen Generic drug: tirzepatide Inject 10 mg into the skin once a week. Friday   Narcan 4 MG/0.1ML Liqd nasal spray kit Generic drug: naloxone  Place 0.4 mg into the nose daily as needed (opioid overdose).   ondansetron 4 MG disintegrating tablet Commonly known as: ZOFRAN-ODT Take 4 mg by mouth every 8 (eight) hours as needed for nausea or vomiting.   Oxycodone HCl 10 MG Tabs Take 1 tablet (10 mg total) by mouth 4 (four) times daily. Per [facility] What changed:  when to take this reasons to take this   pantoprazole 40 MG tablet Commonly known as: PROTONIX TAKE ONE TABLET BY MOUTH DAILY What changed: when to take this   potassium chloride SA 20 MEQ tablet Commonly known as: KLOR-CON M Take 2 tablets (40 mEq total) by mouth 2 (two) times daily. What changed:  how much to take when to take this additional instructions   pramipexole 0.5 MG tablet Commonly known as: MIRAPEX Take 1 tablet (0.5 mg total) by mouth 3 (three) times daily.    spironolactone 25 MG tablet Commonly known as: ALDACTONE Take 0.5 tablets (12.5 mg total) by mouth daily. What changed: how much to take   torsemide 20 MG tablet Commonly known as: DEMADEX Take 1 tablet (20 mg total) by mouth daily. What changed: how much to take        Follow-up Information     Ignatius Specking, NP Follow up in 1 week(s).   Specialty: Nurse Practitioner Contact information: [address] [place] [address] [phone]                Allergies  Allergen Reactions   Amoxicillin-Pot Clavulanate Nausea And Vomiting    Projectile vomiting Did it involve swelling of the face/tongue/throat, SOB, or low BP? No Did it involve sudden or severe rash/hives, skin peeling, or any reaction on the inside of your mouth or nose? No Did you need to seek medical attention at a hospital or doctor's office? No When did it last happen?      20-30 years If all above answers are "NO", may proceed with cephalosporin use.    Adhesive [Tape] Other (See Comments)    Tears skin off - use paper tape    Codeine Other (See Comments)    hallucinations   Crestor [Rosuvastatin Calcium]     Severe muscle cramps   Gabapentin Other (See Comments)    Trembling   Lyrica [Pregabalin] Other (See Comments)    weakness   Morphine And Codeine Nausea And Vomiting and Other (See Comments)    Migraine headaches   Vicodin [Hydrocodone-Acetaminophen] Other (See Comments)    hallucinations   Latex Rash    Consultations: None   Procedures/Studies: CT Head Wo Contrast  Result Date: 10/09/2023 CLINICAL DATA:  Mental status change, unknown cause. EXAM: CT HEAD WITHOUT CONTRAST TECHNIQUE: Contiguous axial images were obtained from the base of the skull through the vertex without intravenous contrast. RADIATION DOSE REDUCTION: This exam was performed according to the departmental dose-optimization program which includes automated exposure control, adjustment of the mA and/or kV  according to patient size and/or use of iterative reconstruction technique. COMPARISON:  CT scan head from 07/15/2023. FINDINGS: Brain: No evidence of acute infarction, hemorrhage, hydrocephalus, extra-axial collection or mass lesion/mass effect. Ventricles are normal. Cerebral volume is age appropriate. Vascular: No hyperdense vessel or unexpected calcification. Intracranial arteriosclerosis. Skull: Normal. Negative for fracture or focal lesion. Note is made of hyperostosis frontalis interna. Sinuses/Orbits: No acute finding. Other: Visualized mastoid air cells are unremarkable. No mastoid effusion. IMPRESSION: *No acute intracranial abnormality. Electronically Signed   By: Rhea Belton  Ramiro Harvest M.D.   On: 10/09/2023 14:42   CT ABDOMEN PELVIS WO CONTRAST  Result Date: 10/09/2023 CLINICAL DATA:  Abdominal pain, acute, nonlocalized EXAM: CT ABDOMEN AND PELVIS WITHOUT CONTRAST TECHNIQUE: Multidetector CT imaging of the abdomen and pelvis was performed following the standard protocol without IV contrast. RADIATION DOSE REDUCTION: This exam was performed according to the departmental dose-optimization program which includes automated exposure control, adjustment of the mA and/or kV according to patient size and/or use of iterative reconstruction technique. COMPARISON:  CT scan abdomen and pelvis from 07/15/2023. FINDINGS: Lower chest: There are patchy atelectatic changes in the visualized lung bases. No overt consolidation. No pleural effusion. The heart is normal in size. No pericardial effusion. Hepatobiliary: The liver is normal in size. Non-cirrhotic configuration. No suspicious mass. No intrahepatic or extrahepatic bile duct dilation. No calcified gallstones. Normal gallbladder wall thickness. No pericholecystic inflammatory changes. Pancreas: Unremarkable. No pancreatic ductal dilatation or surrounding inflammatory changes. Spleen: Within normal limits. No focal lesion. Adrenals/Urinary Tract: Adrenal glands are  unremarkable. No suspicious renal mass. No hydronephrosis. No renal or ureteric calculi. Unremarkable urinary bladder. Stomach/Bowel: No disproportionate dilation of the small or large bowel loops. No evidence of abnormal bowel wall thickening or inflammatory changes. The appendix is unremarkable. There are scattered diverticula mainly in the sigmoid Faw, without imaging signs of diverticulitis. Vascular/Lymphatic: No ascites or pneumoperitoneum. No abdominal or pelvic lymphadenopathy, by size criteria. No aneurysmal dilation of the major abdominal arteries. There are moderate peripheral atherosclerotic vascular calcifications of the aorta and its major branches. Reproductive: The uterus is surgically absent. No large adnexal mass. Other: There is a small fat containing periumbilical hernia. The soft tissues and abdominal wall are otherwise unremarkable. Musculoskeletal: No suspicious osseous lesions. There are mild multilevel degenerative changes in the visualized spine. IMPRESSION: 1. No acute findings in the abdomen or pelvis. 2. Small fat containing periumbilical hernia. 3. Multiple other nonacute observations, as described above. Aortic Atherosclerosis (ICD10-I70.0). Electronically Signed   By: Jules Schick M.D.   On: 10/09/2023 14:37   DG Chest Portable 1 View  Result Date: 10/09/2023 CLINICAL DATA:  Weakness. EXAM: PORTABLE CHEST 1 VIEW COMPARISON:  Two-view chest x-ray 08/30/2023 FINDINGS: The heart is enlarged. Atherosclerotic changes are present at the aortic arch. Mild pulmonary vascular congestion is present without frank edema. No focal airspace disease is present. IMPRESSION: Cardiomegaly and mild pulmonary vascular congestion without frank edema. Electronically Signed   By: Marin Roberts M.D.   On: 10/09/2023 12:19     Discharge Exam: Vitals:   10/10/23 2059 10/11/23 0630  BP: (!) 142/57 (!) 162/58  Pulse: 88 83  Resp: 16 16  Temp: 98.4 F (36.9 C) 98.7 F (37.1 C)  SpO2:  93% 97%   Vitals:   10/10/23 1348 10/10/23 1350 10/10/23 2059 10/11/23 0630  BP: (!) 134/44 (!) 134/44 (!) 142/57 (!) 162/58  Pulse: 83 83 88 83  Resp: 14  16 16   Temp: 98.3 F (36.8 C)  98.4 F (36.9 C) 98.7 F (37.1 C)  TempSrc: Oral  Oral Oral  SpO2: 96%  93% 97%  Weight:      Height:        General: Pt is alert, awake, not in acute distress Cardiovascular: RRR, S1/S2 +, no rubs, no gallops Respiratory: CTA bilaterally, no wheezing, no rhonchi Abdominal: Soft, NT, ND, bowel sounds + Extremities: Chronic bilateral lower extremity lymphedema    The results of significant diagnostics from this hospitalization (including imaging, microbiology, ancillary and laboratory) are listed  below for reference.     Microbiology: Recent Results (from the past 240 hour(s))  SARS Coronavirus 2 by RT PCR (hospital order, performed in [facility] hospital lab) *cepheid single result test* Anterior Nasal Swab     Status: None   Collection Time: 10/09/23 11:00 AM   Specimen: Anterior Nasal Swab  Result Value Ref Range Status   SARS Coronavirus 2 by RT PCR NEGATIVE NEGATIVE Final    Comment: (NOTE) SARS-CoV-2 target nucleic acids are NOT DETECTED.  The SARS-CoV-2 RNA is generally detectable in upper and lower respiratory specimens during the acute phase of infection. The lowest concentration of SARS-CoV-2 viral copies this assay can detect is 250 copies / mL. A negative result does not preclude SARS-CoV-2 infection and should not be used as the sole basis for treatment or other patient management decisions.  A negative result may occur with improper specimen collection / handling, submission of specimen other than nasopharyngeal swab, presence of viral mutation(s) within the areas targeted by this assay, and inadequate number of viral copies (<250 copies / mL). A negative result must be combined with clinical observations, patient history, and epidemiological information.  Fact Sheet  for Patients:   RoadLapTop.co.za  Fact Sheet for Healthcare Providers: http://kim-miller.com/  This test is not yet approved or  cleared by the [place] FDA and has been authorized for detection and/or diagnosis of SARS-CoV-2 by FDA under an Emergency Use Authorization (EUA).  This EUA will remain in effect (meaning this test can be used) for the duration of the COVID-19 declaration under Section 564(b)(1) of the Act, 21 U.S.C. section 360bbb-3(b)(1), unless the authorization is terminated or revoked sooner.  Performed at [facility], [address], [place], [place] [address]   Urine Culture     Status: None   Collection Time: 10/09/23 11:00 AM   Specimen: Urine, Random  Result Value Ref Range Status   Specimen Description   Final    URINE, RANDOM Performed at [name], [address] [place], [place] [address]    Special Requests   Final    NONE Reflexed from [id number] Performed at [facility], [address], [place], [place] [address]    Culture   Final    NO GROWTH Performed at [facility], [address] [place], [place] [address]    Report Status 10/11/2023 FINAL  Final     Labs: BNP (last 3 results) Recent Labs    02/07/23 1806 08/30/23 1623 10/09/23 1100  BNP 216.5* 17.1 20.8   Basic Metabolic Panel: Recent Labs  Lab 10/09/23 1100 10/10/23 0609 10/11/23 0645  NA 135 138 135  K 5.2* 4.0 3.9  CL 101 104 100  CO2 25 27 26   GLUCOSE 98 96 100*  BUN 18 16 12   CREATININE 1.85* 1.43* 1.17*  CALCIUM 9.1 8.6* 8.2*  MG  --   --  2.3   Liver Function Tests: Recent Labs  Lab 10/09/23 1100 10/11/23 0645  AST 21 16  ALT 17 14  ALKPHOS 81 67  BILITOT 1.4* 1.3*  PROT 7.5 6.1*  ALBUMIN 3.8 3.2*   Recent Labs  Lab 10/09/23 1100  LIPASE 26   No results for input(s): "AMMONIA" in the last 168 hours. CBC: Recent Labs  Lab  10/09/23 1100 10/10/23 0609 10/11/23 0645  WBC 15.3* 6.9 9.4  NEUTROABS 12.6*  --  6.5  HGB 11.8* 10.1* 10.7*  HCT 36.9 32.3* 35.1*  MCV 94.4  95.3 97.5  PLT 310 230 253   Cardiac Enzymes: No results for input(s): "CKTOTAL", "CKMB", "CKMBINDEX", "TROPONINI" in the last 168 hours. BNP: Invalid input(s): "POCBNP" CBG: Recent Labs  Lab 10/10/23 0806 10/10/23 1213 10/10/23 1614 10/10/23 2100 10/11/23 0734  GLUCAP 86 94 121* 107* 81   D-Dimer No results for input(s): "DDIMER" in the last 72 hours. Hgb A1c No results for input(s): "HGBA1C" in the last 72 hours. Lipid Profile No results for input(s): "CHOL", "HDL", "LDLCALC", "TRIG", "CHOLHDL", "LDLDIRECT" in the last 72 hours. Thyroid function studies No results for input(s): "TSH", "T4TOTAL", "T3FREE", "THYROIDAB" in the last 72 hours.  Invalid input(s): "FREET3" Anemia work up Recent Labs    10/11/23 0645  VITAMINB12 217   Urinalysis    Component Value Date/Time   COLORURINE YELLOW 10/09/2023 1100   APPEARANCEUR CLEAR 10/09/2023 1100   LABSPEC 1.018 10/09/2023 1100   PHURINE 5.0 10/09/2023 1100   GLUCOSEU NEGATIVE 10/09/2023 1100   GLUCOSEU NEGATIVE 01/30/2022 1342   HGBUR NEGATIVE 10/09/2023 1100   BILIRUBINUR NEGATIVE 10/09/2023 1100   KETONESUR NEGATIVE 10/09/2023 1100   PROTEINUR NEGATIVE 10/09/2023 1100   UROBILINOGEN 0.2 01/30/2022 1342   NITRITE NEGATIVE 10/09/2023 1100   LEUKOCYTESUR SMALL (A) 10/09/2023 1100   Sepsis Labs Recent Labs  Lab 10/09/23 1100 10/10/23 0609 10/11/23 0645  WBC 15.3* 6.9 9.4   Microbiology Recent Results (from the past 240 hour(s))  SARS Coronavirus 2 by RT PCR (hospital order, performed in [facility] hospital lab) *cepheid single result test* Anterior Nasal Swab     Status: None   Collection Time: 10/09/23 11:00 AM   Specimen: Anterior Nasal Swab  Result Value Ref Range Status   SARS Coronavirus 2 by RT PCR NEGATIVE NEGATIVE Final    Comment: (NOTE) SARS-CoV-2  target nucleic acids are NOT DETECTED.  The SARS-CoV-2 RNA is generally detectable in upper and lower respiratory specimens during the acute phase of infection. The lowest concentration of SARS-CoV-2 viral copies this assay can detect is 250 copies / mL. A negative result does not preclude SARS-CoV-2 infection and should not be used as the sole basis for treatment or other patient management decisions.  A negative result may occur with improper specimen collection / handling, submission of specimen other than nasopharyngeal swab, presence of viral mutation(s) within the areas targeted by this assay, and inadequate number of viral copies (<250 copies / mL). A negative result must be combined with clinical observations, patient history, and epidemiological information.  Fact Sheet for Patients:   RoadLapTop.co.za  Fact Sheet for Healthcare Providers: http://kim-miller.com/  This test is not yet approved or  cleared by the [place] FDA and has been authorized for detection and/or diagnosis of SARS-CoV-2 by FDA under an Emergency Use Authorization (EUA).  This EUA will remain in effect (meaning this test can be used) for the duration of the COVID-19 declaration under Section 564(b)(1) of the Act, 21 U.S.C. section 360bbb-3(b)(1), unless the authorization is terminated or revoked sooner.  Performed at [facility], [address], [place], [place] [address]   Urine Culture     Status: None   Collection Time: 10/09/23 11:00 AM   Specimen: Urine, Random  Result Value Ref Range Status   Specimen Description   Final    URINE, RANDOM Performed at [facility], [address], [place], [place] [address]    Special Requests   Final    NONE Reflexed from [phone] Performed at [facility],  [address], [place], [place] [address]    Culture   Final    NO GROWTH Performed at [facility], [address] [place], [place] [address]    Report Status 10/11/2023 FINAL  Final    FURTHER DISCHARGE INSTRUCTIONS:   Get Medicines reviewed and adjusted: Please take all your medications with you for your next visit with your Primary MD   Laboratory/radiological data: Please request your Primary MD to go over all hospital tests and procedure/radiological results at the follow up, please ask your Primary MD to get all Hospital records sent to his/her office.   In some cases, they will be blood work, cultures and biopsy results pending at the time of your discharge. Please request that your primary care M.D. goes through all the records of your hospital data and follows up on these results.   Also Note the following: If you experience worsening of your admission symptoms, develop shortness of breath, life threatening emergency, suicidal or homicidal thoughts you must seek medical attention immediately by calling 911 or calling your MD immediately  if symptoms less severe.   You must read complete instructions/literature along with all the possible adverse reactions/side effects for all the Medicines you take and that have been prescribed to you. Take any new Medicines after you have completely understood and accpet all the possible adverse reactions/side effects.    Do not drive when taking Pain medications or sleeping medications (Benzodaizepines)   Do not take more than prescribed Pain, Sleep and Anxiety Medications. It is not advisable to combine anxiety,sleep and pain medications without talking with your primary care practitioner   Special Instructions: If you have smoked or chewed Tobacco  in the last 2 yrs please stop smoking, stop any regular Alcohol  and or any Recreational drug use.   Wear Seat belts while driving.   Please note: You were cared for by a hospitalist during your hospital stay. Once you are discharged, your primary care physician will handle any  further medical issues. Please note that NO REFILLS for any discharge medications will be authorized once you are discharged, as it is imperative that you return to your primary care physician (or establish a relationship with a primary care physician if you do not have one) for your post hospital discharge needs so that they can reassess your need for medications and monitor your lab values  Time coordinating discharge: Over 30 minutes  SIGNED:   Hughie Closs, MD  [facility] 10/11/2023, 10:44 AM *Please note that this is a verbal dictation therefore any spelling or grammatical errors are due to the "Dragon Medical One" system interpretation. If 7PM-7AM, please contact night-coverage [email or web address]

## 2023-10-11 NOTE — Plan of Care (Signed)
  Problem: Education: Goal: Ability to describe self-care measures that may prevent or decrease complications (Diabetes Survival Skills Education) will improve Outcome: Progressing   Problem: Coping: Goal: Ability to adjust to condition or change in health will improve Outcome: Progressing   Problem: Clinical Measurements: Goal: Ability to maintain clinical measurements within normal limits will improve Outcome: Progressing   Problem: Safety: Goal: Ability to remain free from injury will improve Outcome: Progressing

## 2023-10-11 NOTE — Assessment & Plan Note (Signed)
Longstanding history of substantial lymphedema of the bilateral lower extremities with associated dermatitis Severe swelling has resulted in her being placed on various diuretic regimens Patient would benefit from referral to a lymphedema clinic Otherwise, conservative management, will attempt to reduce eventual diuretic regimen prior to discharge.

## 2023-10-11 NOTE — Evaluation (Signed)
Occupational Therapy Evaluation Patient Details Name: Maria [name] MRN: [id number] DOB: 1956/02/05 Today's Date: 10/11/2023   History of Present Illness Pt is a 67 y.o. femlae presenting to ED with generalized weakness and reports of intermittent hallucinations. Recently admitted to [facility] from 9/9 until 9/16 for generalized weakness and worsening bilateral leg pain.PMH significant for chronic kidney disease stage IIIa, lymphedema, hypertension, gastroesophageal reflux disease, obstructive sleep apnea, diastolic congestive heart failure (Echo 01/2023 EF 65-70%   Clinical Impression   Patient is a 67 year old female who was admitted for above. Patient was recently at hospital with SNF recommendation with patient unable to transition to SNF and went home with [facility]. Patient  lives at home alone with no consistent family support available to patient. Patient was noted to have decreased functional activity tolerance, decreased endurance, decreased standing balance, decreased safety awareness, and decreased knowledge of AD/AE impacting participation in ADLs. Patient will benefit from continued inpatient follow up therapy, <3 hours/day. Patient would continue to benefit from skilled OT services at this time while admitted and after d/c to address noted deficits in order to improve overall safety and independence in ADLs.        If plan is discharge home, recommend the following: A lot of help with bathing/dressing/bathroom;Assistance with cooking/housework;Direct supervision/assist for medications management;Assist for transportation;Help with stairs or ramp for entrance;Direct supervision/assist for financial management;A lot of help with walking and/or transfers    Functional Status Assessment  Patient has had a recent decline in their functional status and demonstrates the ability to make significant improvements in function in a reasonable and predictable amount of time.  Equipment  Recommendations  None recommended by OT       Precautions / Restrictions Precautions Precautions: Fall Restrictions Weight Bearing Restrictions: No      Mobility Bed Mobility Overal bed mobility: Needs Assistance Bed Mobility: Supine to Sit     Supine to sit: Min assist, HOB elevated, Used rails     General bed mobility comments: with increased time.         Balance Overall balance assessment: Mild deficits observed, not formally tested                                         ADL either performed or assessed with clinical judgement   ADL Overall ADL's : Needs assistance/impaired Eating/Feeding: Modified independent;Sitting Eating/Feeding Details (indicate cue type and reason): nauseated today nurse aware Grooming: Minimal assistance;Bed level   Upper Body Bathing: Bed level;Minimal assistance   Lower Body Bathing: Bed level;Maximal assistance   Upper Body Dressing : Bed level;Set up   Lower Body Dressing: Bed level;Maximal assistance Lower Body Dressing Details (indicate cue type and reason): noteed to have skin breakdown behind bilateral knees. Toilet Transfer: Minimal assistance;+2 for physical assistance;+2 for safety/equipment;Rolling walker (2 wheels) Toilet Transfer Details (indicate cue type and reason): side steps to [facility] with increased time Toileting- Clothing Manipulation and Hygiene: Maximal assistance;Sit to/from stand Toileting - Clothing Manipulation Details (indicate cue type and reason): leaning with bottom still on bed when standing even with education not to lean back onto bed.             Vision Baseline Vision/History: 1 Wears glasses Vision Assessment?: No apparent visual deficits            Pertinent Vitals/Pain Pain Assessment Pain Assessment: 0-10 Pain Score: 8  Pain  Location: posterior knees and lower back Pain Descriptors / Indicators: Aching, Discomfort Pain Intervention(s): Limited activity within patient's  tolerance, Monitored during session, Repositioned     Extremity/Trunk Assessment Upper Extremity Assessment Upper Extremity Assessment: Overall WFL for tasks assessed   Lower Extremity Assessment Lower Extremity Assessment: Defer to PT evaluation       Communication Communication Communication: No apparent difficulties Cueing Techniques: Verbal cues;Tactile cues   Cognition Arousal: Alert Behavior During Therapy: WFL for tasks assessed/performed Overall Cognitive Status: Within Functional Limits for tasks assessed             General Comments: Hallucinations of seeing son and his wife. thiniking pills were spilled in bed with her Pt aware that that she is hallucinating.                Home Living Family/patient expects to be discharged to:: Private residence Living Arrangements: Alone Available Help at Discharge: Other (Comment) Type of Home: Apartment Home Access: Stairs to enter Entrance Stairs-Number of Steps: 6 Entrance Stairs-Rails: Right Home Layout: One level     Bathroom Shower/Tub: Tub/shower unit;Curtain   Bathroom Toilet: Standard Bathroom Accessibility: No   Home Equipment: Rollator (4 wheels);Cane - single point;BSC/3in1;Shower seat;Hand held shower head;Grab bars - tub/shower;Adaptive equipment;Rolling Walker (2 wheels);Hospital bed;Tub bench Adaptive Equipment: Reacher;Sock aid;Long-handled shoe horn;Long-handled sponge Additional Comments: pt has lost her mobility after last fall onto her knees      Prior Functioning/Environment Prior Level of Function : History of Falls (last six months);Independent/Modified Independent             Mobility Comments: x2 slow lowers to ground since recent admission- neighbor came to help. Short distances around home since most recent d/c. Insurance company provdes transportation. ADLs Comments: BSC over toilet. Cooks microwave dinners mostly and sometimes orders out. Been independent with ADLs "just takes  longer"        OT Problem List: Decreased activity tolerance;Impaired balance (sitting and/or standing);Decreased coordination;Decreased safety awareness;Decreased knowledge of precautions;Decreased knowledge of use of DME or AE;Pain      OT Treatment/Interventions: Self-care/ADL training;Therapeutic exercise;DME and/or AE instruction;Therapeutic activities;Patient/family education;Balance training    OT Goals(Current goals can be found in the care plan section) Acute Rehab OT Goals Patient Stated Goal: to get legs stronger OT Goal Formulation: With patient Time For Goal Achievement: 10/25/23 Potential to Achieve Goals: Fair  OT Frequency: Min 1X/week       AM-PAC OT "6 Clicks" Daily Activity     Outcome Measure Help from another person eating meals?: A Little Help from another person taking care of personal grooming?: A Little Help from another person toileting, which includes using toliet, bedpan, or urinal?: A Lot Help from another person bathing (including washing, rinsing, drying)?: A Lot Help from another person to put on and taking off regular upper body clothing?: A Little Help from another person to put on and taking off regular lower body clothing?: A Lot 6 Click Score: 15   End of Session Equipment Utilized During Treatment: Gait belt;Rolling walker (2 wheels) Nurse Communication: Mobility status  Activity Tolerance: Patient limited by fatigue Patient left: in bed;with call bell/phone within reach;with bed alarm set (no chair avaialbe in room, bari chair ordered from [employer])  OT Visit Diagnosis: Unsteadiness on feet (R26.81);History of falling (Z91.81);Muscle weakness (generalized) (M62.81);Other abnormalities of gait and mobility (R26.89)                Time: 4098-1191 OT Time Calculation (min): 30 min Charges:  OT  General Charges $OT Visit: 1 Visit OT Evaluation $OT Eval Low Complexity: 1 Low OT Treatments $Self Care/Home Management : 8-22 mins  Rosalio Loud, MS Acute Rehabilitation Department Office# [phone]   Selinda Flavin 10/11/2023, 9:50 AM

## 2023-10-11 NOTE — Progress Notes (Signed)
PROGRESS NOTE    Maria [name]  NWG:[id number] DOB: 02/27/1956 DOA: 10/09/2023 PCP: Ignatius Specking, NP   Brief Narrative:  67 year old female with past medical history of chronic kidney disease stage IIIa, lymphedema, hypertension, gastroesophageal reflux disease, obstructive sleep apnea, diastolic congestive heart failure (Echo 01/2023 EF 65-70% with G2DD) who presented to [place] emergency department with generalized weakness and reports of intermittent hallucinations.   Of note, patient was recently admitted to [facility] from 9/9 until 9/16 for generalized weakness and worsening bilateral leg pain.  Attempts to disposition the patient to a skilled nursing facility after this hospitalization failed and patient was instead discharged home with home health services.   On this presentation, patient was noted to have acute kidney injury with creatinine of 1.85 (baseline 1.1 to 1.2).  Details below.  Assessment & Plan:   Principal Problem:   Acute renal failure superimposed on stage 3a chronic kidney disease (HCC) Active Problems:   Physical deconditioning   Lymphedema   Chronic diastolic CHF (congestive heart failure) (HCC)   Type 2 diabetes mellitus with stage 3a chronic kidney disease, without long-term current use of insulin (HCC)   Hallucinations   Chronic pain syndrome   GERD without esophagitis  Acute renal failure superimposed on stage 3a chronic kidney disease (HCC) Improved and back to baseline of 1.2 after IV hydration.  Perhaps she is overdiuresed, diuretics at hold and plan to discharge on reduced dose of diuretic.   Physical deconditioning Patient continues to complain of severe generalized weakness contributing to her repeated presentations and hospitalizations over the past year . During the last hospitalization in September patient was slated to be discharged to skilled nursing facility however this failed because patient had to pay some co-pay  out-of-pocket which she could not afford and patient eventually had to be discharged home with home health services.  PT OT yet again recommended SNF.  Patient fully aware and remembers that the reason she was discharged to SNF last time was because she could not pay co-pay/out-of-pocket and her daughter decided for her to go home with the help of her niece and sister.  Patient tells me that she talked to her insurance company through her PCP last week and she was informed that they will cover.  I have discussed this with the TOC and waiting for TOC to find out from insurance company if they are going to pay/cover full cost of her rehabilitation.  Lengthy discussion with the patient that if insurance does not cover full cost, patient is still not willing to pay out-of-pocket and in that scenario, she will be willing to discharge home today since she is medically stable.   Chronic diastolic CHF (congestive heart failure) (HCC)/chronic lymphedema Patient exhibits bilateral lower extremity edema however this is chronic due to known lymphedema.   Type 2 diabetes mellitus with stage 3a chronic kidney disease, without long-term current use of insulin (HCC) Hemoglobin A1c 7/25 6.0% Resume Mounjaro at discharge but currently on hold. Accu-Cheks before every meal and nightly with sliding scale insulin   Hallucinations Patient reports hallucinations prior to arrival however patient has not exhibited any such symptoms since admission.  I have consulted psychiatry with the hope that she will be seen, if not seen before discharge then follow-up with psychiatry as outpatient for possible adjustment of her medications.    Chronic pain syndrome Resume all PTA medications.   GERD without esophagitis Continue Protonix  DVT prophylaxis: Lovenox   Code Status: Full  Code  Family Communication:  None present at bedside.  Plan of care discussed with patient in length and he/she verbalized understanding and agreed with  it.  Status is: Observation The patient will require care spanning > 2 midnights and should be moved to inpatient because: Waiting for [facility] to verify from insurance company if she will be covered 100% of the cost of rehabilitation, if not, then plan to discharge her home today.   Estimated body mass index is 59.28 kg/m as calculated from the following:   Height as of this encounter: 5\' 3"  (1.6 m).   Weight as of this encounter: 151.8 kg.    Nutritional Assessment: Body mass index is 59.28 kg/m.Marland Kitchen Seen by dietician.  I agree with the assessment and plan as outlined below: Nutrition Status:        . Skin Assessment: I have examined the patient's skin and I agree with the wound assessment as performed by the wound care RN as outlined below:    Consultants:  None  Procedures:  None  Antimicrobials:  Anti-infectives (From admission, onward)    None         Subjective: Seen and examined.  No complaints today.  Objective: Vitals:   10/10/23 1348 10/10/23 1350 10/10/23 2059 10/11/23 0630  BP: (!) 134/44 (!) 134/44 (!) 142/57 (!) 162/58  Pulse: 83 83 88 83  Resp: 14  16 16   Temp: 98.3 F (36.8 C)  98.4 F (36.9 C) 98.7 F (37.1 C)  TempSrc: Oral  Oral Oral  SpO2: 96%  93% 97%  Weight:      Height:        Intake/Output Summary (Last 24 hours) at 10/11/2023 1049 Last data filed at 10/11/2023 4132 Gross per 24 hour  Intake 483 ml  Output 4500 ml  Net -4017 ml   Filed Weights   10/09/23 1137 10/09/23 2138  Weight: 136 kg (!) 151.8 kg    Examination:  General exam: Appears calm and comfortable  Respiratory system: Clear to auscultation. Respiratory effort normal. Cardiovascular system: S1 & S2 heard, RRR. No JVD, murmurs, rubs, gallops or clicks.  Chronic b/l lower extremity lymphedema. Gastrointestinal system: Abdomen is nondistended, soft and nontender. No organomegaly or masses felt. Normal bowel sounds heard. Central nervous system: Alert and  oriented. No focal neurological deficits. Extremities: Symmetric 5 x 5 power. Skin: No rashes, lesions or ulcers Psychiatry: Judgement and insight appear normal. Mood & affect appropriate.    Data Reviewed: I have personally reviewed following labs and imaging studies  CBC: Recent Labs  Lab 10/09/23 1100 10/10/23 0609 10/11/23 0645  WBC 15.3* 6.9 9.4  NEUTROABS 12.6*  --  6.5  HGB 11.8* 10.1* 10.7*  HCT 36.9 32.3* 35.1*  MCV 94.4 95.3 97.5  PLT 310 230 253   Basic Metabolic Panel: Recent Labs  Lab 10/09/23 1100 10/10/23 0609 10/11/23 0645  NA 135 138 135  K 5.2* 4.0 3.9  CL 101 104 100  CO2 25 27 26   GLUCOSE 98 96 100*  BUN 18 16 12   CREATININE 1.85* 1.43* 1.17*  CALCIUM 9.1 8.6* 8.2*  MG  --   --  2.3   GFR: Estimated Creatinine Clearance: 68.8 mL/min (A) (by C-G formula based on SCr of 1.17 mg/dL (H)). Liver Function Tests: Recent Labs  Lab 10/09/23 1100 10/11/23 0645  AST 21 16  ALT 17 14  ALKPHOS 81 67  BILITOT 1.4* 1.3*  PROT 7.5 6.1*  ALBUMIN 3.8 3.2*   Recent Labs  Lab 10/09/23 1100  LIPASE 26   No results for input(s): "AMMONIA" in the last 168 hours. Coagulation Profile: No results for input(s): "INR", "PROTIME" in the last 168 hours. Cardiac Enzymes: No results for input(s): "CKTOTAL", "CKMB", "CKMBINDEX", "TROPONINI" in the last 168 hours. BNP (last 3 results) No results for input(s): "PROBNP" in the last 8760 hours. HbA1C: No results for input(s): "HGBA1C" in the last 72 hours. CBG: Recent Labs  Lab 10/10/23 0806 10/10/23 1213 10/10/23 1614 10/10/23 2100 10/11/23 0734  GLUCAP 86 94 121* 107* 81   Lipid Profile: No results for input(s): "CHOL", "HDL", "LDLCALC", "TRIG", "CHOLHDL", "LDLDIRECT" in the last 72 hours. Thyroid Function Tests: No results for input(s): "TSH", "T4TOTAL", "FREET4", "T3FREE", "THYROIDAB" in the last 72 hours. Anemia Panel: Recent Labs    10/11/23 0645  VITAMINB12 217   Sepsis Labs: No results for  input(s): "PROCALCITON", "LATICACIDVEN" in the last 168 hours.  Recent Results (from the past 240 hour(s))  SARS Coronavirus 2 by RT PCR (hospital order, performed in [facility] hospital lab) *cepheid single result test* Anterior Nasal Swab     Status: None   Collection Time: 10/09/23 11:00 AM   Specimen: Anterior Nasal Swab  Result Value Ref Range Status   SARS Coronavirus 2 by RT PCR NEGATIVE NEGATIVE Final    Comment: (NOTE) SARS-CoV-2 target nucleic acids are NOT DETECTED.  The SARS-CoV-2 RNA is generally detectable in upper and lower respiratory specimens during the acute phase of infection. The lowest concentration of SARS-CoV-2 viral copies this assay can detect is 250 copies / mL. A negative result does not preclude SARS-CoV-2 infection and should not be used as the sole basis for treatment or other patient management decisions.  A negative result may occur with improper specimen collection / handling, submission of specimen other than nasopharyngeal swab, presence of viral mutation(s) within the areas targeted by this assay, and inadequate number of viral copies (<250 copies / mL). A negative result must be combined with clinical observations, patient history, and epidemiological information.  Fact Sheet for Patients:   RoadLapTop.co.za  Fact Sheet for Healthcare Providers: http://kim-miller.com/  This test is not yet approved or  cleared by the [place] FDA and has been authorized for detection and/or diagnosis of SARS-CoV-2 by FDA under an Emergency Use Authorization (EUA).  This EUA will remain in effect (meaning this test can be used) for the duration of the COVID-19 declaration under Section 564(b)(1) of the Act, 21 U.S.C. section 360bbb-3(b)(1), unless the authorization is terminated or revoked sooner.  Performed at [facility], [address], [place], [place] [address]   Urine Culture      Status: None   Collection Time: 10/09/23 11:00 AM   Specimen: Urine, Random  Result Value Ref Range Status   Specimen Description   Final    URINE, RANDOM Performed at [facility], [address], [place], [place] [address]    Special Requests   Final    NONE Reflexed from [id number] Performed at [facility], [address], [place], [place] [address]    Culture   Final    NO GROWTH Performed at [facility], [address], [place], [place] [address]    Report Status 10/11/2023 FINAL  Final     Radiology Studies: CT Head Wo Contrast  Result Date: 10/09/2023 CLINICAL DATA:  Mental status change, unknown cause. EXAM: CT HEAD WITHOUT CONTRAST TECHNIQUE: Contiguous axial images were obtained from the base  of the skull through the vertex without intravenous contrast. RADIATION DOSE REDUCTION: This exam was performed according to the departmental dose-optimization program which includes automated exposure control, adjustment of the mA and/or kV according to patient size and/or use of iterative reconstruction technique. COMPARISON:  CT scan head from 07/15/2023. FINDINGS: Brain: No evidence of acute infarction, hemorrhage, hydrocephalus, extra-axial collection or mass lesion/mass effect. Ventricles are normal. Cerebral volume is age appropriate. Vascular: No hyperdense vessel or unexpected calcification. Intracranial arteriosclerosis. Skull: Normal. Negative for fracture or focal lesion. Note is made of hyperostosis frontalis interna. Sinuses/Orbits: No acute finding. Other: Visualized mastoid air cells are unremarkable. No mastoid effusion. IMPRESSION: *No acute intracranial abnormality. Electronically Signed   By: Jules Schick M.D.   On: 10/09/2023 14:42   CT ABDOMEN PELVIS WO CONTRAST  Result Date: 10/09/2023 CLINICAL DATA:  Abdominal pain, acute, nonlocalized EXAM: CT ABDOMEN AND PELVIS WITHOUT CONTRAST TECHNIQUE: Multidetector CT imaging of the  abdomen and pelvis was performed following the standard protocol without IV contrast. RADIATION DOSE REDUCTION: This exam was performed according to the departmental dose-optimization program which includes automated exposure control, adjustment of the mA and/or kV according to patient size and/or use of iterative reconstruction technique. COMPARISON:  CT scan abdomen and pelvis from 07/15/2023. FINDINGS: Lower chest: There are patchy atelectatic changes in the visualized lung bases. No overt consolidation. No pleural effusion. The heart is normal in size. No pericardial effusion. Hepatobiliary: The liver is normal in size. Non-cirrhotic configuration. No suspicious mass. No intrahepatic or extrahepatic bile duct dilation. No calcified gallstones. Normal gallbladder wall thickness. No pericholecystic inflammatory changes. Pancreas: Unremarkable. No pancreatic ductal dilatation or surrounding inflammatory changes. Spleen: Within normal limits. No focal lesion. Adrenals/Urinary Tract: Adrenal glands are unremarkable. No suspicious renal mass. No hydronephrosis. No renal or ureteric calculi. Unremarkable urinary bladder. Stomach/Bowel: No disproportionate dilation of the small or large bowel loops. No evidence of abnormal bowel wall thickening or inflammatory changes. The appendix is unremarkable. There are scattered diverticula mainly in the sigmoid Langner, without imaging signs of diverticulitis. Vascular/Lymphatic: No ascites or pneumoperitoneum. No abdominal or pelvic lymphadenopathy, by size criteria. No aneurysmal dilation of the major abdominal arteries. There are moderate peripheral atherosclerotic vascular calcifications of the aorta and its major branches. Reproductive: The uterus is surgically absent. No large adnexal mass. Other: There is a small fat containing periumbilical hernia. The soft tissues and abdominal wall are otherwise unremarkable. Musculoskeletal: No suspicious osseous lesions. There are mild  multilevel degenerative changes in the visualized spine. IMPRESSION: 1. No acute findings in the abdomen or pelvis. 2. Small fat containing periumbilical hernia. 3. Multiple other nonacute observations, as described above. Aortic Atherosclerosis (ICD10-I70.0). Electronically Signed   By: Jules Schick M.D.   On: 10/09/2023 14:37   DG Chest Portable 1 View  Result Date: 10/09/2023 CLINICAL DATA:  Weakness. EXAM: PORTABLE CHEST 1 VIEW COMPARISON:  Two-view chest x-ray 08/30/2023 FINDINGS: The heart is enlarged. Atherosclerotic changes are present at the aortic arch. Mild pulmonary vascular congestion is present without frank edema. No focal airspace disease is present. IMPRESSION: Cardiomegaly and mild pulmonary vascular congestion without frank edema. Electronically Signed   By: Marin Roberts M.D.   On: 10/09/2023 12:19    Scheduled Meds:  Chlorhexidine Gluconate Cloth  6 each Topical Daily   cyanocobalamin  1,000 mcg Oral Daily   DULoxetine  20 mg Oral Daily   enoxaparin (LOVENOX) injection  75 mg Subcutaneous Q24H   fluticasone  1 spray Each Nare Daily  influenza vaccine adjuvanted  0.5 mL Intramuscular Tomorrow-1000   insulin aspart  0-20 Units Subcutaneous TID WC   metoprolol succinate  25 mg Oral Daily   pantoprazole  40 mg Oral BID   pneumococcal 20-valent conjugate vaccine  0.5 mL Intramuscular Tomorrow-1000   pramipexole  0.5 mg Oral TID   sodium chloride flush  3 mL Intravenous Q12H   Continuous Infusions:   LOS: 0 days   Hughie Closs, MD [facility]  10/11/2023, 10:49 AM   *Please note that this is a verbal dictation therefore any spelling or grammatical errors are due to the "Dragon Medical One" system interpretation.  Please page via Amion and do not message via secure chat for urgent patient care matters. Secure chat can be used for non urgent patient care matters.  How to contact the [facility] Attending or Consulting provider 7A - 7P or covering provider  during after hours 7P -7A, for this patient?  Check the care team in [facility] and look for a) attending/consulting TR[facility] provider listed and b) the [facility] team listed. Page or secure chat 7A-7P. Log into [email or web address] and use [place] universal password to access. If you do not have the password, please contact the hospital operator. Locate the [facility] provider you are looking for under [facility] and page to a number that you can be directly reached. If you still have difficulty reaching the provider, please page the [facility] (Director on Call) for the Hospitalists listed on amion for assistance.

## 2023-10-11 NOTE — Progress Notes (Signed)
At time of visit, Maria [name] was having a procedure. Chaplain will follow-up later today or tomorrow.     10/11/23 1500  Spiritual Encounters  Type of Visit Initial  Care provided to: Pt not available

## 2023-10-11 NOTE — Assessment & Plan Note (Addendum)
Patient complaining of chronic bilateral knee and back pain Patient reports that she typically receives oxycodone as an outpatient from her pain management provider which was continued on admission although under further review in the database she has not had this filled since July Continue Cymbalta, as needed Tylenol

## 2023-10-11 NOTE — Assessment & Plan Note (Signed)
Continue Protonix °

## 2023-10-11 NOTE — Progress Notes (Signed)
Mobility Specialist - Progress Note   10/11/23 1130  Mobility  Activity Transferred from bed to chair  Level of Assistance +2 (takes two people)  Assistive Device [employer] Ambulated (ft) 0 ft  Range of Motion/Exercises Active  Activity Response Tolerated well  Mobility Referral Yes  $Mobility charge 1 Mobility  Mobility Specialist Start Time (ACUTE ONLY) 1105  Mobility Specialist Stop Time (ACUTE ONLY) 1115  Mobility Specialist Time Calculation (min) (ACUTE ONLY) 10 min   Received in bed and agreed to mobility, assisted NT in transfer to chair using steady.  Pt was standby for bed mobility, upon sitting EOB pt required Min A from 2 people to stand, one failed attempt then successfully sat up in steady.   Completed transfer and left pt in chair with all needs met. Alarm on and staff in room.  Marilynne Halsted Mobility Specialist

## 2023-10-11 NOTE — Hospital Course (Addendum)
67 year old female with past medical history of chronic kidney disease stage IIIa, lymphedema, hypertension, gastroesophageal reflux disease, obstructive sleep apnea, diastolic congestive heart failure (Echo 01/2023 EF 65-70% with G2DD) who presented to [facility] emergency department with generalized weakness and reports of intermittent hallucinations.  Of note, patient was recently admitted to [facility] from 9/9 until 9/16 for generalized weakness and worsening bilateral leg pain.  Attempts to disposition the patient to a skilled nursing facility after this hospitalization fusion failed and patient was instead discharged home with home health services.  On this presentation, patient was noted to have acute kidney injury with creatinine of 1.85 (baseline 1.1 to 1.2).  Due to acute kidney injury the hospitalist group was called to assess the patient for admission to the hospital.  Patient was admitted to hospital service and placed on intravenous fluids.  Patient was noted to be on chronic spironolactone and torsemide therapy for her chronic lymphedema and diastolic congestive heart failure and therefore these were held.
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
## 2023-10-11 NOTE — Progress Notes (Signed)
PROGRESS NOTE   Maria [name]  WUJ:[id number] DOB: Nov 04, 1956 DOA: 10/09/2023 PCP: Ignatius Specking, NP   Date of Service: the patient was seen and examined on 10/10/2023  Brief Narrative:  67 year old female with past medical history of chronic kidney disease stage IIIa, lymphedema, hypertension, gastroesophageal reflux disease, obstructive sleep apnea, diastolic congestive heart failure (Echo 01/2023 EF 65-70% with G2DD) who presented to [facility] emergency department with generalized weakness and reports of intermittent hallucinations.  Of note, patient was recently admitted to [facility] from 9/9 until 9/16 for generalized weakness and worsening bilateral leg pain.  Attempts to disposition the patient to a skilled nursing facility after this hospitalization fusion failed and patient was instead discharged home with home health services.  On this presentation, patient was noted to have acute kidney injury with creatinine of 1.85 (baseline 1.1 to 1.2).  Due to acute kidney injury the hospitalist group was called to assess the patient for admission to the hospital.  Patient was admitted to hospital service and placed on intravenous fluids.  Patient was noted to be on chronic spironolactone and torsemide therapy for her chronic lymphedema and diastolic congestive heart failure and therefore these were held.   Assessment and Plan: * Acute renal failure superimposed on stage 3a chronic kidney disease (HCC) Improving with combination of gentle intravenous hydration and holding patient's home regimen of diuretics including spironolactone and  torsemide I suspect that patient develops recurrent bouts of acute kidney injury from her chronic diuretic regimen for her lymphedema/diastolic congestive heart failure   Patient was also recently on Bactrim which can also cause renal injury however typically this type of renal injury does not recover so quickly to IV fluids Transitioning patient  from intravenous fluids to oral hydration  Considering the chronicity of her lower extremity edema, once euvolemic and kidneys recover may benefit from a lower dose of diuretics at discharge   Physical deconditioning Patient continues to complain of severe generalized weakness contributing to her repeated presentations and hospitalizations over the past year  During the last hospitalization in September patient was slated to be discharged to skilled nursing facility however this failed and patient eventually had to be discharged home with home health services  Will obtain PT/OT evaluation  Will discuss options with TOC in the morning  TOC consult placed  Chronic diastolic CHF (congestive heart failure) (HCC) Patient exhibits bilateral lower extremity edema however this is chronic due to known lymphedema I believe patient is essentially euvolemic Diuretics are being temporarily held and will likely be resumed at a reduced dose prior to discharge  Lymphedema Longstanding history of substantial lymphedema of the bilateral lower extremities with associated dermatitis Severe swelling has resulted in her being placed on various diuretic regimens Patient would benefit from referral to a lymphedema clinic Otherwise, conservative management, will attempt to reduce eventual diuretic regimen prior to discharge.  Type 2 diabetes mellitus with stage 3a chronic kidney disease, without long-term current use of insulin (HCC) Hemoglobin A1c 7/25 6.0% Hold Mounjaro Accu-Cheks before every meal and nightly with sliding scale insulin  Hallucinations Patient reports hallucinations prior to arrival however patient has not exhibited any such symptoms today  If symptoms persist will consider discontinuation of pramipexole and clonazepam from home regimen Of note, evidence of vitamin B12 deficiency based on previous level on 7/25, will repeat.  Chronic pain syndrome Patient complaining of chronic bilateral  knee and back pain Patient reports that she typically receives oxycodone as an outpatient from her  pain management provider which was continued on admission although under further review in the database she has not had this filled since July Continue Cymbalta, as needed Tylenol  GERD without esophagitis Continue Protonix   Subjective:  Patient is complaining of bilateral knee and back pain.  Pain is moderate to severe in intensity, sharp in quality, worse with movement.  Patient also complains of continued generalized weakness.   Physical Exam:  Vitals:   10/10/23 0548 10/10/23 1348 10/10/23 1350   BP: (!) 146/63 (!) 134/44 (!) 134/44   Pulse: 82 83 83   Resp: 18 14    Temp: 98.3 F (36.8 C) 98.3 F (36.8 C)    TempSrc: Oral Oral    SpO2: 100% 96%    Weight:      Height:         Constitutional: Awake alert and oriented x3, no associated distress.  Patient is obese. Skin: Markedly thickened skin on the anterior surfaces of the bilateral legs with significant hyperemia and mild warmth.   Eyes: Pupils are equally reactive to light.  No evidence of scleral icterus or conjunctival pallor.  ENMT: Moist mucous membranes noted.  Posterior pharynx clear of any exudate or lesions.   Respiratory: diminished breath sounds at the bases, otherwise clear to auscultation bilaterally, no wheezing, no crackles. Normal respiratory effort. No accessory muscle use.  Cardiovascular: Regular rate and rhythm, no murmurs / rubs / gallops. No extremity edema. 2+ pedal pulses. No carotid bruits.  Abdomen: Abdomen is protuberant but soft and nontender.  No evidence of intra-abdominal masses.  Positive bowel sounds noted in all quadrants.   Musculoskeletal: Pain with passive and active range of motion of the bilateral knees.   good ROM, no contractures. Normal muscle tone.    Data Reviewed:  I have personally reviewed and interpreted labs, imaging.  Significant findings are   CBC: Recent Labs  Lab  10/09/23 1100 10/10/23 0609  WBC 15.3* 6.9  NEUTROABS 12.6*  --   HGB 11.8* 10.1*  HCT 36.9 32.3*  MCV 94.4 95.3  PLT 310 230   Basic Metabolic Panel: Recent Labs  Lab 10/09/23 1100 10/10/23 0609  NA 135 138  K 5.2* 4.0  CL 101 104  CO2 25 27  GLUCOSE 98 96  BUN 18 16  CREATININE 1.85* 1.43*  CALCIUM 9.1 8.6*   GFR: Estimated Creatinine Clearance: 56.3 mL/min (A) (by C-G formula based on SCr of 1.43 mg/dL (H)). Liver Function Tests: Recent Labs  Lab 10/09/23 1100  AST 21  ALT 17  ALKPHOS 81  BILITOT 1.4*  PROT 7.5  ALBUMIN 3.8    EKG/Telemetry: Personally reviewed.  Rhythm is normal sinus rhythm with heart rate of 98 bpm.  No dynamic ST segment changes appreciated.   Code Status:  Full code.  Code status decision has been confirmed with: patient, MOST form scanned in chart.    Severity of Illness:  The appropriate patient status for this patient is OBSERVATION. Observation status is judged to be reasonable and necessary in order to provide the required intensity of service to ensure the patient's safety. The patient's presenting symptoms, physical exam findings, and initial radiographic and laboratory data in the context of their medical condition is felt to place them at decreased risk for further clinical deterioration. Furthermore, it is anticipated that the patient will be medically stable for discharge from the hospital within 2 midnights of admission.   Time spent:  56 minutes  Author:  Marinda Elk  MD  10/10/2023

## 2023-10-11 NOTE — Assessment & Plan Note (Signed)
Patient exhibits bilateral lower extremity edema however this is chronic due to known lymphedema I believe patient is essentially euvolemic Diuretics are being temporarily held and will likely be resumed at a reduced dose prior to discharge

## 2023-10-11 NOTE — Evaluation (Signed)
Physical Therapy Evaluation Patient Details Name: Maria [name] MRN: [id number] DOB: [birth year] Today's Date: 10/11/2023  History of Present Illness  Pt is a 67 y.o. femlae presenting to ED with generalized weakness and reports of intermittent hallucinations. Recently admitted to [facility] from 9/9 until 9/16 for generalized weakness and worsening bilateral leg pain.PMH significant for chronic kidney disease stage IIIa, lymphedema, hypertension, gastroesophageal reflux disease, obstructive sleep apnea, diastolic congestive heart failure (Echo 01/2023 EF 65-70%   Clinical Impression  Pt isa 67 y.o. female with above HPI resulting in the deficits listed below (see PT Problem List). Pt recently d/c  from hospital and has been modified independent around home ambulating short distances with RW and performing ADLs with increased time. Reports x2 falls since d/c to home in which she had to call her neighbor to assist her up. On eval, pt requires MAX A+2 for sit to stand and MOD A for rolling in bed- limited by pain and generalized weakness. Pt reports she has limited caregiver support and  performed sit to stand transfers with MIN guard for safety and cues for safe hand placement. Pt will benefit from continued skilled PT to maximize functional mobility and increase independence. Recommend ongoing PT services in post acute setting, <3 hours therapy/day to maximize independence with mobility, decrease fall risk, and improve quality of life.         If plan is discharge home, recommend the following: Two people to help with walking and/or transfers;Two people to help with bathing/dressing/bathroom;Help with stairs or ramp for entrance;Assist for transportation;Assistance with cooking/housework   Can travel by private vehicle   No    Equipment Recommendations None recommended by PT  Recommendations for Other Services       Functional Status Assessment Patient has had a recent decline in their  functional status and demonstrates the ability to make significant improvements in function in a reasonable and predictable amount of time.     Precautions / Restrictions Precautions Precautions: Fall Restrictions Weight Bearing Restrictions: No      Mobility  Bed Mobility Overal bed mobility: Needs Assistance Bed Mobility: Supine to Sit, Sit to Supine, Rolling Rolling: Mod assist, Used rails   Supine to sit: Contact guard, HOB elevated, Used rails Sit to supine: Mod assist, +2 for physical assistance, +2 for safety/equipment   General bed mobility comments: increased time, HOB flat for supine to sit, Pt able to perform rolling L/R with use of bed rails and MOD A for full roll.    Transfers Overall transfer level: Needs assistance Equipment used: Rolling walker (2 wheels) Transfers: Sit to/from Stand Sit to Stand: Max assist, +2 physical assistance, +2 safety/equipment, From elevated surface           General transfer comment: initially unsuccessful attempt at stand despite +2 assist. Able to perform additinoal stand with MAX A+2 about 75% upright. pt with complaints of B posterior knee pain and back pain limiting mobility. Of note- pt able to stand and perform side steps along EOB with OT earlier.    Ambulation/Gait                  Stairs            Wheelchair Mobility     Tilt Bed    Modified Rankin (Stroke Patients Only)       Balance Overall balance assessment: Mild deficits observed, not formally tested  Pertinent Vitals/Pain Pain Assessment Pain Assessment: 0-10 Pain Score: 8  Pain Location: posterior knees and lower back Pain Descriptors / Indicators: Aching, Discomfort Pain Intervention(s): Limited activity within patient's tolerance, Monitored during session, Repositioned, Patient requesting pain meds-RN notified    Home Living Family/patient expects to be discharged to::  Private residence Living Arrangements: Alone Available Help at Discharge: Other (Comment) Type of Home: Apartment Home Access: Stairs to enter Entrance Stairs-Rails: Right Entrance Stairs-Number of Steps: 6   Home Layout: One level Home Equipment: Rollator (4 wheels);Cane - single point;BSC/3in1;Shower seat;Hand held shower head;Grab bars - tub/shower;Adaptive equipment;Rolling Walker (2 wheels);Hospital bed;Tub bench Additional Comments: pt has lost her mobility after last fall onto her knees    Prior Function Prior Level of Function : History of Falls (last six months);Independent/Modified Independent             Mobility Comments: x2 slow lowers to ground since recent admission- neighbor came to help. Short distances around home since most recent d/c. Insurance company provdes transportation. ADLs Comments: BSC over toilet. Cooks microwave dinners mostly and sometimes orders out. Been independent with ADLs "just takes longer"     Extremity/Trunk Assessment   Upper Extremity Assessment Upper Extremity Assessment: Overall WFL for tasks assessed    Lower Extremity Assessment Lower Extremity Assessment: Defer to PT evaluation    Cervical / Trunk Assessment Cervical / Trunk Assessment: Normal  Communication   Communication Communication: No apparent difficulties Cueing Techniques: Verbal cues;Tactile cues  Cognition Arousal: Alert Behavior During Therapy: WFL for tasks assessed/performed Overall Cognitive Status: Within Functional Limits for tasks assessed                                 General Comments: Pt sharing with therapist that she was having hallucinations again-  seeing son and his wife and seeing pills in her hand and someone in her room watching her tv. Pt aware that that she is hallucinating. RN notified.  Tearful throughout session about current mobility status, her sister passing in february stating "I can't live like this, it's not fair" - RN  reporting pt has psych consult pending. Pt open to speaking with somene.        General Comments General comments (skin integrity, edema, etc.): Discussed with RN, NT, and mobility specialist on pt performane and advised for transfer techinque with potential use of stedy and padding of recliner chair for skin integrity. Mobility specialist familiar with pt from previous admission.    Exercises     Assessment/Plan    PT Assessment Patient needs continued PT services  PT Problem List Decreased strength;Decreased range of motion;Decreased activity tolerance;Decreased balance;Decreased mobility;Pain;Obesity       PT Treatment Interventions DME instruction;Gait training;Stair training;Functional mobility training;Therapeutic activities;Therapeutic exercise;Balance training;Patient/family education    PT Goals (Current goals can be found in the Care Plan section)  Acute Rehab PT Goals Patient Stated Goal: Stop having hallucinations and improve strength and mobility PT Goal Formulation: With patient Time For Goal Achievement: 10/25/23 Potential to Achieve Goals: Good    Frequency Min 1X/week     Co-evaluation               AM-PAC PT "6 Clicks" Mobility  Outcome Measure Help needed turning from your back to your side while in a flat bed without using bedrails?: A Lot Help needed moving from lying on your back to sitting on the side of a flat bed  without using bedrails?: A Lot Help needed moving to and from a bed to a chair (including a wheelchair)?: Total Help needed standing up from a chair using your arms (e.g., wheelchair or bedside chair)?: Total Help needed to walk in hospital room?: Total Help needed climbing 3-5 steps with a railing? : Total 6 Click Score: 8    End of Session Equipment Utilized During Treatment: Gait belt Activity Tolerance: Patient limited by pain Patient left: in bed;with call bell/phone within reach Nurse Communication: Mobility status PT Visit  Diagnosis: Muscle weakness (generalized) (M62.81);Difficulty in walking, not elsewhere classified (R26.2);Pain Pain - Right/Left: Right (and L) Pain - part of body: Knee (and lower back)    Time: 1610-9604 PT Time Calculation (min) (ACUTE ONLY): 30 min   Charges:   PT Evaluation $PT Eval Low Complexity: 1 Low PT Treatments $Therapeutic Activity: 8-22 mins PT General Charges $$ ACUTE PT VISIT: 1 Visit        Lyman Speller PT, DPT  Acute Rehabilitation Services  Office [phone]

## 2023-10-11 NOTE — Progress Notes (Signed)
Mobility Specialist - Progress Note   10/11/23 1411  Mobility  Activity Transferred from chair to bed  Level of Assistance +2 (takes two people)  Assistive Device [employer] Ambulated (ft) 0 ft  Range of Motion/Exercises Active  Activity Response Tolerated well  Mobility Referral Yes  $Mobility charge 1 Mobility  Mobility Specialist Start Time (ACUTE ONLY) 1358  Mobility Specialist Stop Time (ACUTE ONLY) 1409  Mobility Specialist Time Calculation (min) (ACUTE ONLY) 11 min   Pt received in chair and agreed to mobility, had no issues throughout session. Using steady two successfully stand up twice. Returned to bed with all needs met and staff in room.  Marilynne Halsted Mobility Specialist

## 2023-10-11 NOTE — Assessment & Plan Note (Signed)
Hemoglobin A1c 7/25 6.0% Hold Mounjaro Accu-Cheks before every meal and nightly with sliding scale insulin

## 2023-10-11 NOTE — Care Management Obs Status (Signed)
MEDICARE OBSERVATION STATUS NOTIFICATION   Patient Details  Name: Maria [name] MRN: [id number] Date of Birth: [birth year]   Medicare Observation Status Notification Given:  Yes    Beckie Busing, RN 10/11/2023, 3:48 PM

## 2023-10-12 ENCOUNTER — Observation Stay (HOSPITAL_COMMUNITY): Payer: 59

## 2023-10-12 DIAGNOSIS — N179 Acute kidney failure, unspecified: Secondary | ICD-10-CM | POA: Diagnosis not present

## 2023-10-12 DIAGNOSIS — R443 Hallucinations, unspecified: Secondary | ICD-10-CM | POA: Diagnosis not present

## 2023-10-12 DIAGNOSIS — N1831 Chronic kidney disease, stage 3a: Secondary | ICD-10-CM | POA: Diagnosis not present

## 2023-10-12 LAB — CBC WITH DIFFERENTIAL/PLATELET
Abs Immature Granulocytes: 0.05 10*3/uL (ref 0.00–0.07)
Basophils Absolute: 0 10*3/uL (ref 0.0–0.1)
Basophils Relative: 1 %
Eosinophils Absolute: 0.2 10*3/uL (ref 0.0–0.5)
Eosinophils Relative: 2 %
HCT: 32 % — ABNORMAL LOW (ref 36.0–46.0)
Hemoglobin: 10.2 g/dL — ABNORMAL LOW (ref 12.0–15.0)
Immature Granulocytes: 1 %
Lymphocytes Relative: 22 %
Lymphs Abs: 1.9 10*3/uL (ref 0.7–4.0)
MCH: 29.9 pg (ref 26.0–34.0)
MCHC: 31.9 g/dL (ref 30.0–36.0)
MCV: 93.8 fL (ref 80.0–100.0)
Monocytes Absolute: 0.7 10*3/uL (ref 0.1–1.0)
Monocytes Relative: 8 %
Neutro Abs: 5.8 10*3/uL (ref 1.7–7.7)
Neutrophils Relative %: 66 %
Platelets: 243 10*3/uL (ref 150–400)
RBC: 3.41 MIL/uL — ABNORMAL LOW (ref 3.87–5.11)
RDW: 13.1 % (ref 11.5–15.5)
WBC: 8.6 10*3/uL (ref 4.0–10.5)
nRBC: 0 % (ref 0.0–0.2)

## 2023-10-12 LAB — GLUCOSE, CAPILLARY
Glucose-Capillary: 101 mg/dL — ABNORMAL HIGH (ref 70–99)
Glucose-Capillary: 140 mg/dL — ABNORMAL HIGH (ref 70–99)
Glucose-Capillary: 110 mg/dL — ABNORMAL HIGH (ref 70–99)
Glucose-Capillary: 122 mg/dL — ABNORMAL HIGH (ref 70–99)

## 2023-10-12 LAB — BASIC METABOLIC PANEL
Anion gap: 7 (ref 5–15)
BUN: 14 mg/dL (ref 8–23)
CO2: 28 mmol/L (ref 22–32)
Calcium: 8.1 mg/dL — ABNORMAL LOW (ref 8.9–10.3)
Chloride: 101 mmol/L (ref 98–111)
Creatinine, Ser: 1.07 mg/dL — ABNORMAL HIGH (ref 0.44–1.00)
GFR, Estimated: 57 mL/min — ABNORMAL LOW (ref >60)
Glucose, Bld: 144 mg/dL — ABNORMAL HIGH (ref 70–99)
Potassium: 3.9 mmol/L (ref 3.5–5.1)
Sodium: 136 mmol/L (ref 135–145)

## 2023-10-12 LAB — PROCALCITONIN: Procalcitonin: <0.1 ng/mL

## 2023-10-12 LAB — BRAIN NATRIURETIC PEPTIDE: B Natriuretic Peptide: 226.7 pg/mL — ABNORMAL HIGH (ref 0.0–100.0)

## 2023-10-12 MED ORDER — HALOPERIDOL 0.5 MG PO TABS
1.0000 mg | ORAL_TABLET | Freq: Two times a day (BID) | ORAL | Status: DC | PRN
  Administered 2023-10-12: 1 mg via ORAL
  Filled 2023-10-12: qty 2

## 2023-10-12 MED ORDER — FUROSEMIDE 10 MG/ML IJ SOLN
40.0000 mg | Freq: Once | INTRAMUSCULAR | Status: AC
  Administered 2023-10-12: 40 mg via INTRAVENOUS
  Filled 2023-10-12: qty 4

## 2023-10-12 NOTE — Plan of Care (Signed)
  Problem: Education: Goal: Ability to describe self-care measures that may prevent or decrease complications (Diabetes Survival Skills Education) will improve Outcome: Progressing Goal: Individualized Educational Video(s) Outcome: Progressing   Problem: Coping: Goal: Ability to adjust to condition or change in health will improve Outcome: Progressing   Problem: Metabolic: Goal: Ability to maintain appropriate glucose levels will improve Outcome: Progressing   Problem: Nutritional: Goal: Maintenance of adequate nutrition will improve Outcome: Progressing Goal: Progress toward achieving an optimal weight will improve Outcome: Progressing   Problem: Skin Integrity: Goal: Risk for impaired skin integrity will decrease Outcome: Progressing   Problem: Tissue Perfusion: Goal: Adequacy of tissue perfusion will improve Outcome: Progressing   Problem: Education: Goal: Knowledge of General Education information will improve Description: Including pain rating scale, medication(s)/side effects and non-pharmacologic comfort measures Outcome: Progressing   Problem: Clinical Measurements: Goal: Ability to maintain clinical measurements within normal limits will improve Outcome: Progressing Goal: Will remain free from infection Outcome: Progressing Goal: Diagnostic test results will improve Outcome: Progressing Goal: Respiratory complications will improve Outcome: Progressing Goal: Cardiovascular complication will be avoided Outcome: Progressing   Problem: Elimination: Goal: Will not experience complications related to urinary retention Outcome: Progressing   Problem: Pain Managment: Goal: General experience of comfort will improve Outcome: Progressing   Problem: Safety: Goal: Ability to remain free from injury will improve Outcome: Progressing   Problem: Skin Integrity: Goal: Risk for impaired skin integrity will decrease Outcome: Progressing

## 2023-10-12 NOTE — Progress Notes (Signed)
Mobility Specialist - Progress Note   10/12/23 1052  Mobility  Activity Transferred from bed to chair  Level of Assistance Moderate assist, patient does 50-74%  Assistive Device Stedy  Distance Ambulated (ft) 0 ft  Range of Motion/Exercises Active  Activity Response Tolerated well  Mobility Referral Yes  $Mobility charge 1 Mobility  Mobility Specialist Start Time (ACUTE ONLY) 1035  Mobility Specialist Stop Time (ACUTE ONLY) 1050  Mobility Specialist Time Calculation (min) (ACUTE ONLY) 15 min   Received in bed and agreed to sit in chair. Used steady and NT to assist. Pt bed mobility is standby assist requiring little to no assistance.   STS has progressed to a Mod A.  Left in chair with all needs met.  Marilynne Halsted Mobility Specialist

## 2023-10-12 NOTE — Plan of Care (Signed)
  Problem: Education: Goal: Ability to describe self-care measures that may prevent or decrease complications (Diabetes Survival Skills Education) will improve Outcome: Progressing Goal: Individualized Educational Video(s) Outcome: Progressing   Problem: Coping: Goal: Ability to adjust to condition or change in health will improve Outcome: Progressing   

## 2023-10-12 NOTE — Progress Notes (Signed)
PROGRESS NOTE    Maria [name]  QMV:[id number] DOB: [birth year] DOA: 10/09/2023 PCP: Ignatius Specking, NP   Brief Narrative:  67 year old female with past medical history of chronic kidney disease stage IIIa, lymphedema, hypertension, gastroesophageal reflux disease, obstructive sleep apnea, diastolic congestive heart failure (Echo 01/2023 EF 65-70% with G2DD) who presented to [facility] emergency department with generalized weakness and reports of intermittent hallucinations.   Of note, patient was recently admitted to [facility] from 9/9 until 9/16 for generalized weakness and worsening bilateral leg pain.  Attempts to disposition the patient to a skilled nursing facility after this hospitalization failed and patient was instead discharged home with home health services.   On this presentation, patient was noted to have acute kidney injury with creatinine of 1.85 (baseline 1.1 to 1.2).  Details below.  Assessment & Plan:   Principal Problem:   Acute renal failure superimposed on stage 3a chronic kidney disease (HCC) Active Problems:   Physical deconditioning   Lymphedema   Chronic diastolic CHF (congestive heart failure) (HCC)   Type 2 diabetes mellitus with stage 3a chronic kidney disease, without long-term current use of insulin (HCC)   Hallucinations   Chronic pain syndrome   GERD without esophagitis  Acute renal failure superimposed on stage 3a chronic kidney disease (HCC) Improved and back to baseline of 1.2 after IV hydration.  Perhaps she is overdiuresed, diuretics on hold.  I have ordered repeat BMP today which is still pending.   Physical deconditioning Patient continues to complain of severe generalized weakness contributing to her repeated presentations and hospitalizations over the past year . During the last hospitalization in September patient was slated to be discharged to skilled nursing facility however this failed because patient had to pay some co-pay  out-of-pocket which she could not afford and patient eventually had to be discharged home with home health services.  PT OT yet again recommended SNF.  Patient fully aware and remembers that the reason she was discharged to SNF last time was because she could not pay co-pay/out-of-pocket and her daughter decided for her to go home with the help of her niece and sister.  Patient tells me that she talked to her insurance company through her PCP last week and she was informed that they will cover.  I have discussed this with the TOC and waiting for TOC to find out from insurance company if they are going to pay/cover full cost of her rehabilitation.     Acute on chronic diastolic CHF (congestive heart failure) (HCC)/chronic lymphedema Patient exhibits bilateral lower extremity edema however this is chronic due to known lymphedema.  However this morning she was noted to be hypoxic requiring 2 L of oxygen and she is complaining of shortness of breath.  I repeated chest x-ray which shows vascular congestion.  I ordered BNP which is pending.  Looks like she is now having some CHF exacerbation due to holding diuretics and giving her fluids.  I will give her 1 dose of IV Lasix 40 mg.  Still waiting for BMP which was ordered few hours ago.   Type 2 diabetes mellitus with stage 3a chronic kidney disease, without long-term current use of insulin (HCC) Hemoglobin A1c 7/25 6.0% Resume Mounjaro at discharge but currently on hold. Accu-Cheks before every meal and nightly with sliding scale insulin   Hallucinations Patient reports hallucinations prior to arrival however patient has not exhibited any such symptoms since admission.  Seen by psychiatry, no change in medications recommended.  Chronic pain syndrome Resume all PTA medications.   GERD without esophagitis Continue Protonix  Morbid obesity: Weight loss and dietary modification counseled.  DVT prophylaxis: Lovenox   Code Status: Full Code  Family  Communication:  None present at bedside.  Plan of care discussed with patient in length and he/she verbalized understanding and agreed with it.  Status is: Observation The patient will require care spanning > 2 midnights and should be moved to inpatient because: Waiting for [facility] to verify from insurance company if she will be covered 100% of the cost of rehabilitation, if not, then plan to discharge her home today.   Estimated body mass index is 59.28 kg/m as calculated from the following:   Height as of this encounter: 5\' 3"  (1.6 m).   Weight as of this encounter: 151.8 kg.    Nutritional Assessment: Body mass index is 59.28 kg/m.Marland Kitchen Seen by dietician.  I agree with the assessment and plan as outlined below: Nutrition Status:        . Skin Assessment: I have examined the patient's skin and I agree with the wound assessment as performed by the wound care RN as outlined below:    Consultants:  None  Procedures:  None  Antimicrobials:  Anti-infectives (From admission, onward)    None         Subjective: Patient seen and examined.  She complains of shortness of breath.  Objective: Vitals:   10/12/23 0517 10/12/23 0926 10/12/23 1303 10/12/23 1305  BP: 137/64  (!) 166/56 117/63  Pulse: 86  81 80  Resp: 18  18   Temp: 99.1 F (37.3 C)  98.3 F (36.8 C)   TempSrc: Oral  Oral   SpO2: 93% 94% 94% 96%  Weight:      Height:        Intake/Output Summary (Last 24 hours) at 10/12/2023 1545 Last data filed at 10/12/2023 0600 Gross per 24 hour  Intake 240 ml  Output 800 ml  Net -560 ml   Filed Weights   10/09/23 1137 10/09/23 2138  Weight: 136 kg (!) 151.8 kg    Examination:  General exam: Appears calm and comfortable, morbidly obese Respiratory system: Clear to auscultation. Respiratory effort normal. Cardiovascular system: S1 & S2 heard, RRR. No JVD, murmurs, rubs, gallops or clicks.  Chronic bilateral lower extremity lymphedema. Gastrointestinal system:  Abdomen is nondistended, soft and nontender. No organomegaly or masses felt. Normal bowel sounds heard. Central nervous system: Alert and oriented. No focal neurological deficits. Extremities: Symmetric 5 x 5 power. Skin: No rashes, lesions or ulcers.  Psychiatry: Judgement and insight appear normal. Mood & affect appropriate.   Data Reviewed: I have personally reviewed following labs and imaging studies  CBC: Recent Labs  Lab 10/09/23 1100 10/10/23 0609 10/11/23 0645  WBC 15.3* 6.9 9.4  NEUTROABS 12.6*  --  6.5  HGB 11.8* 10.1* 10.7*  HCT 36.9 32.3* 35.1*  MCV 94.4 95.3 97.5  PLT 310 230 253   Basic Metabolic Panel: Recent Labs  Lab 10/09/23 1100 10/10/23 0609 10/11/23 0645  NA 135 138 135  K 5.2* 4.0 3.9  CL 101 104 100  CO2 25 27 26   GLUCOSE 98 96 100*  BUN 18 16 12   CREATININE 1.85* 1.43* 1.17*  CALCIUM 9.1 8.6* 8.2*  MG  --   --  2.3   GFR: Estimated Creatinine Clearance: 68.8 mL/min (A) (by C-G formula based on SCr of 1.17 mg/dL (H)). Liver Function Tests: Recent Labs  Lab 10/09/23  1100 10/11/23 0645  AST 21 16  ALT 17 14  ALKPHOS 81 67  BILITOT 1.4* 1.3*  PROT 7.5 6.1*  ALBUMIN 3.8 3.2*   Recent Labs  Lab 10/09/23 1100  LIPASE 26   No results for input(s): "AMMONIA" in the last 168 hours. Coagulation Profile: No results for input(s): "INR", "PROTIME" in the last 168 hours. Cardiac Enzymes: No results for input(s): "CKTOTAL", "CKMB", "CKMBINDEX", "TROPONINI" in the last 168 hours. BNP (last 3 results) No results for input(s): "PROBNP" in the last 8760 hours. HbA1C: No results for input(s): "HGBA1C" in the last 72 hours. CBG: Recent Labs  Lab 10/11/23 1150 10/11/23 1606 10/11/23 2056 10/12/23 0753 10/12/23 1153  GLUCAP 125* 91 95 101* 140*   Lipid Profile: No results for input(s): "CHOL", "HDL", "LDLCALC", "TRIG", "CHOLHDL", "LDLDIRECT" in the last 72 hours. Thyroid Function Tests: No results for input(s): "TSH", "T4TOTAL", "FREET4",  "T3FREE", "THYROIDAB" in the last 72 hours. Anemia Panel: Recent Labs    10/11/23 0645  VITAMINB12 217   Sepsis Labs: No results for input(s): "PROCALCITON", "LATICACIDVEN" in the last 168 hours.  Recent Results (from the past 240 hour(s))  SARS Coronavirus 2 by RT PCR (hospital order, performed in [facility] hospital lab) *cepheid single result test* Anterior Nasal Swab     Status: None   Collection Time: 10/09/23 11:00 AM   Specimen: Anterior Nasal Swab  Result Value Ref Range Status   SARS Coronavirus 2 by RT PCR NEGATIVE NEGATIVE Final    Comment: (NOTE) SARS-CoV-2 target nucleic acids are NOT DETECTED.  The SARS-CoV-2 RNA is generally detectable in upper and lower respiratory specimens during the acute phase of infection. The lowest concentration of SARS-CoV-2 viral copies this assay can detect is 250 copies / mL. A negative result does not preclude SARS-CoV-2 infection and should not be used as the sole basis for treatment or other patient management decisions.  A negative result may occur with improper specimen collection / handling, submission of specimen other than nasopharyngeal swab, presence of viral mutation(s) within the areas targeted by this assay, and inadequate number of viral copies (<250 copies / mL). A negative result must be combined with clinical observations, patient history, and epidemiological information.  Fact Sheet for Patients:   RoadLapTop.co.za  Fact Sheet for Healthcare Providers: http://kim-miller.com/  This test is not yet approved or  cleared by the [place] FDA and has been authorized for detection and/or diagnosis of SARS-CoV-2 by FDA under an Emergency Use Authorization (EUA).  This EUA will remain in effect (meaning this test can be used) for the duration of the COVID-19 declaration under Section 564(b)(1) of the Act, 21 U.S.C. section 360bbb-3(b)(1), unless the authorization is  terminated or revoked sooner.  Performed at [facility], [address] [place], [place] [address]   Urine Culture     Status: None   Collection Time: 10/09/23 11:00 AM   Specimen: Urine, Random  Result Value Ref Range Status   Specimen Description   Final    URINE, RANDOM Performed at [facility], [address] [place], [place] [address]    Special Requests   Final    NONE Reflexed from [id number] Performed at [facility], [address], [place], [place] [address]    Culture   Final    NO GROWTH Performed at [facility], [address] [place], [place] [address]    Report Status 10/11/2023 FINAL  Final  Radiology Studies: DG CHEST PORT 1 VIEW  Result Date: 10/12/2023 CLINICAL DATA:  CHF EXAM: PORTABLE CHEST 1 VIEW COMPARISON:  Chest x-ray 10/09/2023 FINDINGS: The heart is enlarged. There central pulmonary vascular congestion. There is no lung consolidation, pleural effusion or pneumothorax. No acute fractures are seen. IMPRESSION: Cardiomegaly with central pulmonary vascular congestion. Electronically Signed   By: Darliss Cheney M.D.   On: 10/12/2023 15:34    Scheduled Meds:  Chlorhexidine Gluconate Cloth  6 each Topical Daily   cyanocobalamin  1,000 mcg Oral Daily   DULoxetine  20 mg Oral Daily   enoxaparin (LOVENOX) injection  75 mg Subcutaneous Q24H   fluticasone  1 spray Each Nare Daily   furosemide  40 mg Intravenous Once   influenza vaccine adjuvanted  0.5 mL Intramuscular Tomorrow-1000   insulin aspart  0-20 Units Subcutaneous TID WC   metoprolol succinate  25 mg Oral Daily   pantoprazole  40 mg Oral BID   pneumococcal 20-valent conjugate vaccine  0.5 mL Intramuscular Tomorrow-1000   pramipexole  0.5 mg Oral TID   sodium chloride flush  3 mL Intravenous Q12H   Continuous Infusions:   LOS: 0 days   Hughie Closs, MD [facility]  10/12/2023, 3:45 PM   *Please note that this is a  verbal dictation therefore any spelling or grammatical errors are due to the "Dragon Medical One" system interpretation.  Please page via Amion and do not message via secure chat for urgent patient care matters. Secure chat can be used for non urgent patient care matters.  How to contact the [facility] Attending or Consulting provider 7A - 7P or covering provider during after hours 7P -7A, for this patient?  Check the care team in [facility] and look for a) attending/consulting TRH provider listed and b) the [facility] team listed. Page or secure chat 7A-7P. Log into [email or web address] and use [facility] universal password to access. If you do not have the password, please contact the hospital operator. Locate the [facility] provider you are looking for under [facility] and page to a number that you can be directly reached. If you still have difficulty reaching the provider, please page the [facility] (Director on Call) for the Hospitalists listed on amion for assistance.

## 2023-10-12 NOTE — TOC Initial Note (Signed)
Transition of Care [facility]) - Initial/Assessment Note    Patient Details  Name: Maria [name] MRN: [id number] Date of Birth: 26-May-1956  Transition of [facility]) CM/SW Contact:    Beckie Busing, RN Phone Number:[phone]  10/12/2023, 1:18 PM  Clinical Narrative:                 [facility] consulted for SNF placement. Patient is from home where she is modified independent. Patient does have PCP and follows on a regular basis. Patient has no DME. CM at bedside to assess patient and assist with disposition planning. Patient states that she is willing to go to SNF for rehab as long as her insurance will pay. Patient agrees that she needs rehab but will not be able to go if there is an expensive copay. CM confirmed plan for SNF with daughter [name] Cirri) per [name] Cirri she is agreeable for patient going to rehab only if insurance will pay. PASRR has been completed, FL2 completed, and info has been faxed out for bed offers. Insurance Berkley Harvey has been initiated  Electronics engineer ID # [id number]). TOC will continue to follow for bed offers.   Expected Discharge Plan: Skilled Nursing Facility Barriers to Discharge: SNF Pending bed offer   Patient Goals and CMS Choice Patient states their goals for this hospitalization and ongoing recovery are:: Wants to get better to go home. CMS Medicare.gov Compare Post Acute Care list provided to:: Patient Choice offered to / list presented to : Adult Children, Patient [place] ownership interest in [facility].provided to:: Patient    Expected Discharge Plan and Services In-house Referral: NA Discharge Planning Services: CM Consult Post Acute Care Choice: Skilled Nursing Facility Living arrangements for the past 2 months: Apartment                 DME Arranged: N/A DME Agency: NA       HH Arranged: NA HH Agency: NA        Prior Living Arrangements/Services Living arrangements for the past 2 months: Apartment Lives with:: Self Patient language and need for  interpreter reviewed:: Yes Do you feel safe going back to the place where you live?: Yes      Need for Family Participation in Patient Care: Yes (Comment) Care giver support system in place?: Yes (comment) Current home services:  (n/a) Criminal Activity/Legal Involvement Pertinent to Current Situation/Hospitalization: No - Comment as needed  Activities of Daily Living   ADL Screening (condition at time of admission) Independently performs ADLs?: No Does the patient have a NEW difficulty with bathing/dressing/toileting/self-feeding that is expected to last >3 days?: Yes (Initiates electronic notice to provider for possible OT consult) Does the patient have a NEW difficulty with getting in/out of bed, walking, or climbing stairs that is expected to last >3 days?: Yes (Initiates electronic notice to provider for possible PT consult) Does the patient have a NEW difficulty with communication that is expected to last >3 days?: No Is the patient deaf or have difficulty hearing?: No Does the patient have difficulty seeing, even when wearing glasses/contacts?: Yes Does the patient have difficulty concentrating, remembering, or making decisions?: No  Permission Sought/Granted Permission sought to share information with : Family Supports Permission granted to share information with : Yes, Verbal Permission Granted  Share Information with NAME: Erin Hearing  Permission granted to share info w AGENCY: yes  Permission granted to share info w Relationship: daughter  Permission granted to share info w Contact Information: [phone]  Emotional Assessment Appearance::  Appears stated age Attitude/Demeanor/Rapport: Gracious Affect (typically observed): Pleasant Orientation: : Oriented to Self, Oriented to Place, Oriented to  Time, Oriented to Situation Alcohol / Substance Use: Not Applicable Psych Involvement: No (comment)  Admission diagnosis:  AKI (acute kidney injury) (HCC) [N17.9] Patient  Active Problem List   Diagnosis Date Noted   Hallucinations 09/02/2023   Stasis dermatitis of both legs 08/30/2023   Hypomagnesemia 08/02/2023   Ankle pain, right 08/01/2023   Weakness of both lower extremities 07/30/2023   Morbid obesity (HCC) 07/30/2023   (HFpEF) heart failure with preserved ejection fraction (HCC) 07/15/2023   Abdominal pain 07/15/2023   Acute renal failure superimposed on stage 3a chronic kidney disease (HCC) 07/15/2023   Acute on chronic respiratory failure with hypoxia and hypercapnia (HCC) 02/08/2023   Pneumonia due to infectious organism 02/05/2023   Chronic right-sided congestive heart failure (HCC) 02/04/2023   Fall 02/02/2023   UTI due to extended-spectrum beta lactamase (ESBL) producing Escherichia coli 07/22/2022   Physical deconditioning 07/22/2022   Fluid overload 07/21/2022   Acute cystitis with hematuria    Dyspnea 12/19/2021   Leg swelling 11/20/2021   Bilateral knee pain 10/29/2021   Hypernatremia 09/24/2021   Cellulitis of lower extremity 09/24/2021   CKD (chronic kidney disease), stage IIIa (HCC) 09/24/2021   Sacral decubitus ulcer, stage II (HCC) 09/20/2021   Acute on chronic heart failure with preserved ejection fraction (HFpEF) (HCC) 09/19/2021   Nail disorder 07/07/2021   Pre-ulcerative corn or callous 07/07/2021   Rash 07/07/2021   Chronic diastolic CHF (congestive heart failure) (HCC) 03/17/2021   Type 2 diabetes mellitus with stage 3a chronic kidney disease, without long-term current use of insulin (HCC) 03/11/2021   Vitamin D deficiency 01/01/2021   Medial epicondylitis 12/31/2020   Aortic atherosclerosis (HCC) 09/26/2020   Umbilical hernia 09/26/2020   Constipation 08/05/2020   Arthritis 06/12/2020   Hoarseness 05/01/2020   B12 deficiency 01/31/2020   Rosacea 01/31/2020   Neck swelling 01/31/2020   History of colonic polyps    Benign neoplasm of [place]    Nausea 08/31/2019   Throat pain 07/26/2019   HLD (hyperlipidemia)  05/24/2019   Leg cramps 05/24/2019   Lump in neck 05/24/2019   Left thyroid nodule 01/25/2019   Jerking movements of extremities 07/20/2018   Balance disorder 07/20/2018   Right knee pain 03/21/2018   OSA (obstructive sleep apnea) 03/21/2018   Oxygen desaturation 03/21/2018   Urinary symptom or sign 03/21/2018   Lymphedema 01/25/2018   Gait disorder 01/25/2018   Dysphonia 12/07/2017   Encounter for well adult exam with abnormal findings 11/08/2017   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   Veron polyp 08/11/2016   Abdominal pain, epigastric    Dysphagia    [facility] cancer screening    ACE-inhibitor cough 12/05/2015   Restless legs syndrome 10/08/2015   Venous stasis dermatitis of both lower extremities 04/26/2015   GERD without esophagitis 10/17/2014   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/17/2014   AR (allergic rhinitis) 10/17/2014   Fatty liver 10/17/2014   Chronic pain syndrome 09/19/2014   Post-traumatic osteoarthritis of both knees 09/19/2014   Spinal stenosis 09/19/2014   Depression, major, single episode, complete remission (HCC) 09/19/2014   Narcotic dependence (HCC) 09/19/2014   Acute hypoxic respiratory failure (HCC) 08/20/2014   False positive HIV serology 06/02/2013   Hypokalemia 06/29/2012   Spondylosis of 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 03/09/2012   Lumbar disc disease 03/09/2012   Morbid obesity with body mass index (BMI) of 50.0 to 59.9  in adult [facility]) 06/17/2011   Chronic low back pain 12/28/2008   Anxiety state 12/06/2007   Depression 12/06/2007   HTN (hypertension) 12/06/2007   Gastroesophageal reflux disease without esophagitis 12/06/2007   LOW BACK PAIN 12/06/2007   PCP:  Ignatius Specking, NP Pharmacy:   [phone] - [place], [place] - [address] [place] [address] Phone: [phone] Fax: [phone]  [facility] - [place], [place] - [address]  [address] [place] [address] Phone: [phone] Fax: [phone]     Social Determinants of Health (SDOH) Social History: SDOH Screenings   Food Insecurity: No Food Insecurity (10/09/2023)  Recent Concern: Food Insecurity - Food Insecurity Present (09/06/2023)  Housing: Low Risk  (10/09/2023)  Transportation Needs: No Transportation Needs (10/09/2023)  Utilities: Not At Risk (10/09/2023)  Alcohol Screen: Low Risk  (12/30/2021)  Depression (PHQ2-9): Medium Risk (01/30/2022)  Financial Resource Strain: Medium Risk (12/30/2021)  Physical Activity: Inactive (12/30/2021)  Social Connections: Unknown (09/17/2022)   [facility] from [facility]  Stress: No Stress Concern Present (12/30/2021)  Tobacco Use: Medium Risk (10/09/2023)   SDOH Interventions:     Readmission Risk Interventions    09/06/2023    1:10 PM 07/22/2022    2:59 PM 04/11/2021   11:40 AM  Readmission Risk Prevention Plan  Transportation Screening Complete Complete Complete  PCP or Specialist Appt within 3-5 Days Complete Complete Complete  HRI or Home Care Consult Complete Complete Complete  Social Work Consult for Recovery Care Planning/Counseling Complete Complete Complete  Palliative Care Screening Not Applicable Complete Not Applicable  Medication Review Oceanographer) Complete Complete Complete

## 2023-10-12 NOTE — Plan of Care (Signed)

## 2023-10-12 NOTE — Consult Note (Signed)
[facility] Psychiatry New Face-to-Face Psychiatric Evaluation   Service Date: October 12, 2023 LOS:  LOS: 0 days    Assessment  Maria [name] is a 67 y.o. female admitted medically for 10/09/2023 10:23 AM for worsening pain and weakness. She carries the psychiatric diagnoses of MDD and  unspecified anxiety and has a past medical history of chronic kidney disease stage IIIa, lymphedema, hypertension, gastroesophageal reflux disease, obstructive sleep apnea, diastolic congestive heart failure. Psychiatry was consulted for hallucinations by Hughie Closs, MD.   There have been no significant changes since yesterday's assessment. The patient reports an ongoing history of visual distortions, including blurry and double vision, with her last ophthalmology appointment nearly two years ago. She notes improvement in her symptoms without medication management and feels significantly better compared to yesterday. While she experienced a particularly bad episode yesterday, she reports only one instance of visual hallucinations this morning. Importantly, she recognizes these hallucinations as unreal and does not feel threatened by them.  During the conversation, the patient reflected on her mother's passing and expressed a desire to outlive her mother, revealing some fear of dying. She is open to speaking with the chaplain and has been encouraged to utilize her religious practices.  On a Likert scale, she rates her anxiety as 4 out of 10, with 10 being the worst. While her hallucinations continue to wax and wane, there has been overall improvement since yesterday, suggesting a possible connection to delirium or altered mental status in the context of acute kidney injury (AKI). No new psychiatric concerns were identified at this time. Although the patient does not appear disoriented, she acknowledges that her mental status fluctuates and that her hallucinations generally arise when she is ill and  hospitalized.  At present, her hallucinations do not cause her distress, though they can be bothersome throughout the day. She expresses a desire to minimize medication, given that she has not experienced complete distress. The patient acknowledges having baseline anxiety and demonstrates insight regarding her visual hallucinations, recognizing them as such.    Diagnoses:  Active Hospital problems: Principal Problem:   Acute renal failure superimposed on stage 3a chronic kidney disease (HCC) Active Problems:   Chronic pain syndrome   GERD without esophagitis   Lymphedema   Type 2 diabetes mellitus with stage 3a chronic kidney disease, without long-term current use of insulin (HCC)   Chronic diastolic CHF (congestive heart failure) (HCC)   Physical deconditioning   Hallucinations     Plan  ## Safety and Observation Level:  - Based on my clinical evaluation, I estimate the patient to be at low to risk of self harm in the current setting - At this time, we recommend a routine level of observation. This decision is based on my review of the chart including patient's history and current presentation, interview of the patient, mental status examination, and consideration of suicide risk including evaluating suicidal ideation, plan, intent, suicidal or self-harm behaviors, risk factors, and protective factors. This judgment is based on our ability to directly address suicide risk, implement suicide prevention strategies and develop a safety plan while the patient is in the clinical setting. Please contact our team if there is a concern that risk level has changed.   ## Medications:  -- Continue home Cymbalta 20 mg daily - Continue home Klonopin 1 mg twice daily as needed -Haldol 1mg  po Bid prn    ## Medical Decision Making Capacity:  Not formally assessed  ## Further Work-up:  -- Per  primary    -- most recent EKG on 10/09/2023 had QtC of 442, heart rate 98 -- Pertinent labwork  reviewed earlier this admission includes:     Latest Ref Rng & Units 10/11/2023    6:45 AM 10/10/2023    6:09 AM 10/09/2023   11:00 AM  CMP  Glucose 70 - 99 mg/dL 578  96  98   BUN 8 - 23 mg/dL 12  16  18    Creatinine 0.44 - 1.00 mg/dL 4.69  6.29  5.28   Sodium 135 - 145 mmol/L 135  138  135   Potassium 3.5 - 5.1 mmol/L 3.9  4.0  5.2   Chloride 98 - 111 mmol/L 100  104  101   CO2 22 - 32 mmol/L 26  27  25    Calcium 8.9 - 10.3 mg/dL 8.2  8.6  9.1   Total Protein 6.5 - 8.1 g/dL 6.1   7.5   Total Bilirubin 0.3 - 1.2 mg/dL 1.3   1.4   Alkaline Phos 38 - 126 U/L 67   81   AST 15 - 41 U/L 16   21   ALT 0 - 44 U/L 14   17      ## Disposition:  -- Per primary  ## Behavioral / Environmental:  -- DELIRIUM RECS 1: Avoid antihistamines, anticholinergics, and minimize opiate use as these may worsen delirium. 2:Assess, prevent and manage pain as lack of treatment can result in delirium.  3: Recommend consult to PT/OT if not already done. Early mobility and exercise has been shown to decrease duration of delirium.  4:Provide appropriate lighting and clear signage; a clock and calendar should be easily visible to the patient. 5:Monitor environmental factors. Reduce light and noise at night (close shades, turn off lights, turn off TV, ect). Correct any alterations in sleep cycle. 6: Reorient the patient to person, place, time and situation on each encounter.  7: Correct sensory deficits if possible (replace eye glasses, hearing aids, ect). 8: Avoid restraints. Severely delirious patients benefit from constant observation by a sitter. 9: Do not leave patient unattended.     ##Legal Status Voluntary  Thank you for this consult request. Recommendations have been communicated to the primary team.  We will sign off at this time.   [name], FNP   NEW  history  Relevant Aspects of Hospital Course:  Admitted on 10/09/2023 for AKI and weakness.  Patient Report:   Patient is  alert and oriented x4, calm and cooperative, very attentive and engages well with psychiatric nurse practitioner.   On today's evaluation she is observed to be lying in bed in the room. She appears to be receptive to current situation at hand to include searching for placement for rehab.  She further denies any acute psychiatric symptoms that include suicidal ideations, homicidal ideations, and or auditory or visual hallucinations.  Patient denies any complaints and or questions. She is very appropriate and does seem to have a linear conversation.  There does not appear to be any evidence of confabulation, psychosis, delusional thinking.  She does not appear to be responding to internal stimuli, external stimuli.  She is able to engage well and follow all commands.  She further denies any thoughts to want to harm himself or other people.  This does not appear to be MDD with psychosis or dementia, more so hallucinations in the setting of AKI.    Psychiatric History:  Information collected from patient Inpatient: Denies Outpatient: Denies  Therapist: Currently seeing a therapist weekly and, virtual  Family psych history: Daughter: Bipolar, sister: Depression   Social History:  -Lives alone - On disability for chronic pain, degenerative disk disease disorder, fibromyalgia and spinal stenosis  Tobacco use: Denies   Family History:   The patient's family history includes Anxiety disorder in her sister; Asthma in her daughter and sister; Bipolar disorder in her daughter; Cancer in her maternal uncle and another family member; Depression in her sister; Diabetes in her mother; Heart disease in her mother; Heart failure in her mother; Hypertension in her father and mother; Liver cancer in her paternal grandmother.  Medical History: Past Medical History:  Diagnosis Date   Acute lymphadenitis 2011   ALLERGIC RHINITIS 08/10/2007   ANXIETY 12/06/2007   Atherosclerotic peripheral vascular disease (HCC)  06/13/2013   Aorta on CT June 2014   Cellulitis 2011   Cervical disc disease 03/09/2012   CHF (congestive heart failure) (HCC)    Chronic pain 03/09/2012   DEPRESSION 12/06/2007   Diabetes (HCC)    DIVERTICULOSIS, [place] 12/06/2007   GERD 12/06/2007   Hepatitis    age 43 hepatitis A   HLD (hyperlipidemia) 05/24/2019   HYPERTENSION 12/06/2007   Lumbar disc disease 03/09/2012   Mesenteric adenitis    MRSA 2006   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 2010   CERVICAL DISC SURGERY  may 24th 2010   COLONOSCOPY WITH PROPOFOL N/A 07/10/2016   Procedure: COLONOSCOPY WITH PROPOFOL;  Surgeon: Ruffin Frederick, MD;  Location: [facility];  Service: Gastroenterology;  Laterality: N/A;   COLONOSCOPY WITH PROPOFOL N/A 09/26/2019   Procedure: COLONOSCOPY WITH PROPOFOL;  Surgeon: Benancio Deeds, MD;  Location: [facility];  Service: Gastroenterology;  Laterality: N/A;   ESOPHAGOGASTRODUODENOSCOPY (EGD) WITH PROPOFOL N/A 07/10/2016   Procedure: ESOPHAGOGASTRODUODENOSCOPY (EGD) WITH PROPOFOL;  Surgeon: Ruffin Frederick, MD;  Location: [facility];  Service: Gastroenterology;  Laterality: N/A;   POLYPECTOMY  09/26/2019   Procedure: POLYPECTOMY;  Surgeon: Benancio Deeds, MD;  Location: [facility];  Service: Gastroenterology;;   RADIOLOGY WITH ANESTHESIA N/A 07/30/2023   Procedure: MRI WITH ANESTHESIA - THORACIC LUMBAR WITHOUT CONSTRAST;  Surgeon: Radiologist, Medication, MD;  Location: [facility] OR;  Service: Radiology;  Laterality: N/A;   s/p ovary cyst     s/p right knee arthroscopy     Dr. Dion Saucier ortho    Medications:   Current Facility-Administered Medications:    acetaminophen (TYLENOL) tablet 650 mg, 650 mg, Oral, Q6H PRN, 650 mg at 10/09/23 2149  **OR** acetaminophen (TYLENOL) suppository 650 mg, 650 mg, Rectal, Q6H PRN, Venora Maples, MD   Chlorhexidine Gluconate Cloth 2 % PADS 6 each, 6 each, Topical, Daily, Shalhoub, Deno Lunger, MD, 6 each at 10/11/23 1114   clonazePAM (KLONOPIN) tablet 1 mg, 1 mg, Oral, BID PRN, Venora Maples, MD, 1 mg at 10/11/23 1027   cyanocobalamin (VITAMIN B12) tablet 1,000 mcg, 1,000 mcg, Oral, Daily, Venora Maples, MD, 1,000 mcg at 10/12/23 0851   DULoxetine (CYMBALTA) DR capsule 20 mg, 20 mg, Oral, Daily, Venora Maples, MD, 20 mg at 10/12/23 0851   enoxaparin (LOVENOX) injection 75 mg, 75 mg, Subcutaneous, Q24H, Venora Maples, MD, 75 mg at 10/11/23 2052   fluticasone (FLONASE) 50 MCG/ACT nasal  spray 1 spray, 1 spray, Each Nare, Daily, Venora Maples, MD, 1 spray at 10/12/23 0850   haloperidol (HALDOL) tablet 1 mg, 1 mg, Oral, BID PRN, Rosario Adie, Juel Burrow, FNP   influenza vaccine adjuvanted (FLUAD) injection 0.5 mL, 0.5 mL, Intramuscular, Tomorrow-1000, Crissie Reese, Mary Sella, MD   insulin aspart (novoLOG) injection 0-20 Units, 0-20 Units, Subcutaneous, TID WC, Venora Maples, MD, 3 Units at 10/12/23 1228   metoprolol succinate (TOPROL-XL) 24 hr tablet 25 mg, 25 mg, Oral, Daily, Venora Maples, MD, 25 mg at 10/12/23 0851   ondansetron (ZOFRAN) tablet 4 mg, 4 mg, Oral, Q6H PRN **OR** ondansetron (ZOFRAN) injection 4 mg, 4 mg, Intravenous, Q6H PRN, Venora Maples, MD, 4 mg at 10/11/23 2052   oxyCODONE (Oxy IR/ROXICODONE) immediate release tablet 10 mg, 10 mg, Oral, Q4H PRN, Venora Maples, MD, 10 mg at 10/12/23 0851   pantoprazole (PROTONIX) EC tablet 40 mg, 40 mg, Oral, BID, Venora Maples, MD, 40 mg at 10/12/23 6213   pneumococcal 20-valent conjugate vaccine (PREVNAR 20) injection 0.5 mL, 0.5 mL, Intramuscular, Tomorrow-1000, [place], Mary Sella, MD   polyethylene glycol (MIRALAX / GLYCOLAX) packet 17 g, 17 g, Oral, Daily PRN, Venora Maples, MD   pramipexole  (MIRAPEX) tablet 0.5 mg, 0.5 mg, Oral, TID, Venora Maples, MD, 0.5 mg at 10/12/23 0850   sodium chloride flush (NS) 0.9 % injection 3 mL, 3 mL, Intravenous, Q12H, Venora Maples, MD, 3 mL at 10/12/23 0865   traZODone (DESYREL) tablet 50 mg, 50 mg, Oral, QHS PRN, Venora Maples, MD  Allergies: Allergies  Allergen Reactions   Amoxicillin-Pot Clavulanate Nausea And Vomiting    Projectile vomiting Did it involve swelling of the face/tongue/throat, SOB, or low BP? No Did it involve sudden or severe rash/hives, skin peeling, or any reaction on the inside of your mouth or nose? No Did you need to seek medical attention at a hospital or doctor's office? No When did it last happen?      20-30 years If all above answers are "NO", may proceed with cephalosporin use.    Adhesive [Tape] Other (See Comments)    Tears skin off - use paper tape    Codeine Other (See Comments)    hallucinations   Crestor [Rosuvastatin Calcium]     Severe muscle cramps   Gabapentin Other (See Comments)    Trembling   Lyrica [Pregabalin] Other (See Comments)    weakness   Morphine And Codeine Nausea And Vomiting and Other (See Comments)    Migraine headaches   Vicodin [Hydrocodone-Acetaminophen] Other (See Comments)    hallucinations   Latex Rash       Objective  Vital signs:  Temp:  [98.3 F (36.8 C)-99.2 F (37.3 C)] 98.3 F (36.8 C) (10/22 1303) Pulse Rate:  [80-91] 80 (10/22 1305) Resp:  [18-20] 18 (10/22 1303) BP: (117-166)/(54-66) 117/63 (10/22 1305) SpO2:  [92 %-96 %] 96 % (10/22 1305)  Psychiatric Specialty Exam:  Presentation  General Appearance: Appropriate for Environment; Casual  Eye Contact:Good  Speech:Clear and Coherent; Normal Rate  Speech Volume:Normal  Handedness:Right   Mood and Affect  Mood:Euthymic  Affect:Appropriate; Congruent   Thought Process  Thought Processes:Coherent; Linear  Descriptions of Associations:Intact  Orientation:Full (Time, Place  and Person)  Thought Content:Logical  History of Schizophrenia/Schizoaffective disorder:No data recorded Duration of Psychotic Symptoms:No data recorded Hallucinations:Hallucinations: Visual Description of Visual Hallucinations: black fuzzy hairs in the window  Ideas of Reference:None  Suicidal Thoughts:Suicidal Thoughts: No  Homicidal Thoughts:Homicidal Thoughts: No   Sensorium  Memory:Immediate Fair; Recent Good; Remote Good  Judgment:Fair  Insight:Fair   Executive Functions  Concentration:Good  Attention Span:Good  Recall:Good  Fund of Knowledge:Good  Language:Good   Psychomotor Activity  Psychomotor Activity:Psychomotor Activity: Normal   Assets  Assets:Communication Skills; Desire for Improvement; Housing; Health and safety inspector; Social Support; Other (comment) (spiritual)   Sleep  Sleep:Sleep: Fair    Physical Exam: Physical Exam Vitals and nursing note reviewed.  Constitutional:      Appearance: She is obese.  HENT:     Head: Normocephalic and atraumatic.  Pulmonary:     Effort: Pulmonary effort is normal.  Neurological:     General: No focal deficit present.     Mental Status: She is alert and oriented to person, place, and time. Mental status is at baseline.  Psychiatric:        Mood and Affect: Mood normal.        Behavior: Behavior normal.        Thought Content: Thought content normal.        Judgment: Judgment normal.    Review of Systems  Eyes:  Positive for blurred vision and double vision.  Psychiatric/Behavioral:  Positive for hallucinations. Negative for depression and suicidal ideas. The patient is nervous/anxious.   All other systems reviewed and are negative.  Blood pressure 117/63, pulse 80, temperature 98.3 F (36.8 C), temperature source Oral, resp. rate 18, height 5\' 3"  (1.6 m), weight (!) 151.8 kg, SpO2 96%. Body mass index is 59.28 kg/m.

## 2023-10-12 NOTE — [place] FL2 (Signed)
[place] MEDICAID FL2 LEVEL OF CARE FORM     IDENTIFICATION  Patient Name: Maria [name] Birthdate: 10/03/1956 Sex: female Admission Date (Current Location): 10/09/2023  [facility] and [place] Number:  Producer, television/film/video and Address:  [facility],  [address], [place] [address]      Provider Number: [id number]  Attending Physician Name and Address:  Hughie Closs, MD  Relative Name and Phone Number:  Erin Hearing    Current Level of Care: Hospital Recommended Level of Care: Skilled Nursing Facility Prior Approval Number:    Date Approved/Denied:   PASRR Number: [id number]  Discharge Plan: SNF    Current Diagnoses: Patient Active Problem List   Diagnosis Date Noted   Hallucinations 09/02/2023   Stasis dermatitis of both legs 08/30/2023   Hypomagnesemia 08/02/2023   Ankle pain, right 08/01/2023   Weakness of both lower extremities 07/30/2023   Morbid obesity (HCC) 07/30/2023   (HFpEF) heart failure with preserved ejection fraction (HCC) 07/15/2023   Abdominal pain 07/15/2023   Acute renal failure superimposed on stage 3a chronic kidney disease (HCC) 07/15/2023   Acute on chronic respiratory failure with hypoxia and hypercapnia (HCC) 02/08/2023   Pneumonia due to infectious organism 02/05/2023   Chronic right-sided congestive heart failure (HCC) 02/04/2023   Fall 02/02/2023   UTI due to extended-spectrum beta lactamase (ESBL) producing Escherichia coli 07/22/2022   Physical deconditioning 07/22/2022   Fluid overload 07/21/2022   Acute cystitis with hematuria    Dyspnea 12/19/2021   Leg swelling 11/20/2021   Bilateral knee pain 10/29/2021   Hypernatremia 09/24/2021   Cellulitis of lower extremity 09/24/2021   CKD (chronic kidney disease), stage IIIa (HCC) 09/24/2021   Sacral decubitus ulcer, stage II (HCC) 09/20/2021   Acute on chronic heart failure with preserved ejection fraction (HFpEF) (HCC) 09/19/2021   Nail disorder 07/07/2021    Pre-ulcerative corn or callous 07/07/2021   Rash 07/07/2021   Chronic diastolic CHF (congestive heart failure) (HCC) 03/17/2021   Type 2 diabetes mellitus with stage 3a chronic kidney disease, without long-term current use of insulin (HCC) 03/11/2021   Vitamin D deficiency 01/01/2021   Medial epicondylitis 12/31/2020   Aortic atherosclerosis (HCC) 09/26/2020   Umbilical hernia 09/26/2020   Constipation 08/05/2020   Arthritis 06/12/2020   Hoarseness 05/01/2020   B12 deficiency 01/31/2020   Rosacea 01/31/2020   Neck swelling 01/31/2020   History of colonic polyps    Benign neoplasm of [place]    Nausea 08/31/2019   Throat pain 07/26/2019   HLD (hyperlipidemia) 05/24/2019   Leg cramps 05/24/2019   Lump in neck 05/24/2019   Left thyroid nodule 01/25/2019   Jerking movements of extremities 07/20/2018   Balance disorder 07/20/2018   Right knee pain 03/21/2018   OSA (obstructive sleep apnea) 03/21/2018   Oxygen desaturation 03/21/2018   Urinary symptom or sign 03/21/2018   Lymphedema 01/25/2018   Gait disorder 01/25/2018   Dysphonia 12/07/2017   Encounter for well adult exam with abnormal findings 11/08/2017   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   [place] polyp 08/11/2016   Abdominal pain, epigastric    Dysphagia    [place] cancer screening    ACE-inhibitor cough 12/05/2015   Restless legs syndrome 10/08/2015   Venous stasis dermatitis of both lower extremities 04/26/2015   GERD without esophagitis 10/17/2014   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/17/2014   AR (allergic rhinitis) 10/17/2014   Fatty liver 10/17/2014   Chronic pain syndrome 09/19/2014   Post-traumatic osteoarthritis of both knees 09/19/2014  Spinal stenosis 09/19/2014   Depression, major, single episode, complete remission (HCC) 09/19/2014   Narcotic dependence (HCC) 09/19/2014   Acute hypoxic respiratory failure (HCC) 08/20/2014   False positive HIV serology 06/02/2013   Hypokalemia 06/29/2012   Spondylosis  of 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 03/09/2012   Lumbar disc disease 03/09/2012   Morbid obesity with body mass index (BMI) of 50.0 to 59.9 in adult [facility]) 06/17/2011   Chronic low back pain 12/28/2008   Anxiety state 12/06/2007   Depression 12/06/2007   HTN (hypertension) 12/06/2007   Gastroesophageal reflux disease without esophagitis 12/06/2007   LOW BACK PAIN 12/06/2007    Orientation RESPIRATION BLADDER Height & Weight     Self, Time, Situation, Place  Normal Continent Weight: (!) 151.8 kg Height:  5\' 3"  (160 cm)  BEHAVIORAL SYMPTOMS/MOOD NEUROLOGICAL BOWEL NUTRITION STATUS  Other (Comment) (Anxiety/ forgetful)  (n/a) Continent Diet  AMBULATORY STATUS COMMUNICATION OF NEEDS Skin   Extensive Assist (2 person) Verbally Other (Comment) (scratch marks to bilateral breast)                       Personal Care Assistance Level of Assistance  Bathing, Feeding, Dressing Bathing Assistance: Limited assistance Feeding assistance: Limited assistance (set up only) Dressing Assistance: Limited assistance     Functional Limitations Info  Sight, Hearing, Speech Sight Info: Impaired (glasses) Hearing Info: Adequate Speech Info: Adequate    SPECIAL CARE FACTORS FREQUENCY  PT (By licensed PT), OT (By licensed OT)     PT Frequency: 5X/wk OT Frequency: 5X/wk            Contractures Contractures Info: Not present    Additional Factors Info  Code Status, Allergies, Psychotropic, Insulin Sliding Scale, Isolation Precautions, Suctioning Needs Code Status Info: Full Allergies Info: Amoxicillin-pot Clavulanate, Adhesive (Tape), Codeine, Crestor (Rosuvastatin Calcium), Gabapentin, Lyrica (Pregabalin), Morphine And Codeine, Vicodin (Hydrocodone-acetaminophen), Latex Psychotropic Info: see d/c summary Insulin Sliding Scale Info: see d/c summary Isolation Precautions Info: Contact - ESBL Suctioning Needs: n/a   Current Medications (10/12/2023):  This is the current  hospital active medication list Current Facility-Administered Medications  Medication Dose Route Frequency Provider Last Rate Last Admin   acetaminophen (TYLENOL) tablet 650 mg  650 mg Oral Q6H PRN Venora Maples, MD   650 mg at 10/09/23 2149   Or   acetaminophen (TYLENOL) suppository 650 mg  650 mg Rectal Q6H PRN Venora Maples, MD       Chlorhexidine Gluconate Cloth 2 % PADS 6 each  6 each Topical Daily Shalhoub, Deno Lunger, MD   6 each at 10/11/23 1114   clonazePAM (KLONOPIN) tablet 1 mg  1 mg Oral BID PRN Venora Maples, MD   1 mg at 10/11/23 9811   cyanocobalamin (VITAMIN B12) tablet 1,000 mcg  1,000 mcg Oral Daily Venora Maples, MD   1,000 mcg at 10/12/23 0851   DULoxetine (CYMBALTA) DR capsule 20 mg  20 mg Oral Daily Venora Maples, MD   20 mg at 10/12/23 0851   enoxaparin (LOVENOX) injection 75 mg  75 mg Subcutaneous Q24H Venora Maples, MD   75 mg at 10/11/23 2052   fluticasone (FLONASE) 50 MCG/ACT nasal spray 1 spray  1 spray Each Nare Daily Venora Maples, MD   1 spray at 10/12/23 0850   haloperidol (HALDOL) tablet 1 mg  1 mg Oral BID PRN [name], FNP       influenza vaccine adjuvanted (FLUAD) injection 0.5  mL  0.5 mL Intramuscular Tomorrow-1000 Venora Maples, MD       insulin aspart (novoLOG) injection 0-20 Units  0-20 Units Subcutaneous TID WC Venora Maples, MD   3 Units at 10/11/23 1206   metoprolol succinate (TOPROL-XL) 24 hr tablet 25 mg  25 mg Oral Daily Venora Maples, MD   25 mg at 10/12/23 0851   ondansetron (ZOFRAN) tablet 4 mg  4 mg Oral Q6H PRN Venora Maples, MD       Or   ondansetron [facility]) injection 4 mg  4 mg Intravenous Q6H PRN Venora Maples, MD   4 mg at 10/11/23 2052   oxyCODONE (Oxy IR/ROXICODONE) immediate release tablet 10 mg  10 mg Oral Q4H PRN Venora Maples, MD   10 mg at 10/12/23 0851   pantoprazole (PROTONIX) EC tablet 40 mg  40 mg Oral BID Venora Maples, MD   40 mg at 10/12/23 4098    pneumococcal 20-valent conjugate vaccine (PREVNAR 20) injection 0.5 mL  0.5 mL Intramuscular Tomorrow-1000 Venora Maples, MD       polyethylene glycol (MIRALAX / GLYCOLAX) packet 17 g  17 g Oral Daily PRN Venora Maples, MD       pramipexole (MIRAPEX) tablet 0.5 mg  0.5 mg Oral TID Venora Maples, MD   0.5 mg at 10/12/23 0850   sodium chloride flush (NS) 0.9 % injection 3 mL  3 mL Intravenous Q12H Venora Maples, MD   3 mL at 10/12/23 1191   traZODone (DESYREL) tablet 50 mg  50 mg Oral QHS PRN Venora Maples, MD         Discharge Medications: Please see discharge summary for a list of discharge medications.  Relevant Imaging Results:  Relevant Lab Results:   Additional Information ss#[id number]  Beckie Busing, RN

## 2023-10-13 DIAGNOSIS — N1831 Chronic kidney disease, stage 3a: Secondary | ICD-10-CM | POA: Diagnosis not present

## 2023-10-13 DIAGNOSIS — N179 Acute kidney failure, unspecified: Secondary | ICD-10-CM | POA: Diagnosis not present

## 2023-10-13 LAB — GLUCOSE, CAPILLARY
Glucose-Capillary: 88 mg/dL (ref 70–99)
Glucose-Capillary: 123 mg/dL — ABNORMAL HIGH (ref 70–99)
Glucose-Capillary: 124 mg/dL — ABNORMAL HIGH (ref 70–99)
Glucose-Capillary: 102 mg/dL — ABNORMAL HIGH (ref 70–99)

## 2023-10-13 MED ORDER — TORSEMIDE 20 MG PO TABS
20.0000 mg | ORAL_TABLET | Freq: Every day | ORAL | Status: DC
  Administered 2023-10-13: 20 mg via ORAL
  Filled 2023-10-13 (×2): qty 1

## 2023-10-13 MED ORDER — FAMOTIDINE 20 MG PO TABS
40.0000 mg | ORAL_TABLET | Freq: Every day | ORAL | Status: DC
  Administered 2023-10-13 – 2023-10-14 (×2): 40 mg via ORAL
  Filled 2023-10-13 (×2): qty 2

## 2023-10-13 MED ORDER — SPIRONOLACTONE 12.5 MG HALF TABLET
12.5000 mg | ORAL_TABLET | Freq: Every day | ORAL | Status: DC
  Administered 2023-10-13 – 2023-10-14 (×2): 12.5 mg via ORAL
  Filled 2023-10-13 (×2): qty 1

## 2023-10-13 MED ORDER — ALBUTEROL SULFATE (2.5 MG/3ML) 0.083% IN NEBU: 2.5000 mg | INHALATION_SOLUTION | Freq: Four times a day (QID) | RESPIRATORY_TRACT | Status: DC | PRN

## 2023-10-13 NOTE — Progress Notes (Signed)
Mobility Specialist - Progress Note   10/13/23 0900  Mobility  Activity Transferred from bed to chair  Level of Assistance Minimal assist, patient does 75% or more  Assistive Device Stedy  Range of Motion/Exercises Active  Activity Response Tolerated well  Mobility Referral Yes  $Mobility charge 1 Mobility  Mobility Specialist Start Time (ACUTE ONLY) 0850  Mobility Specialist Stop Time (ACUTE ONLY) 0902  Mobility Specialist Time Calculation (min) (ACUTE ONLY) 12 min   Received in bed and agreed to transfer, pt was Min A for first STS using steady. Second STS from the steady pt was IND. Pt left in chair with all needs met.  Marilynne Halsted Mobility Specialist

## 2023-10-13 NOTE — TOC Progression Note (Addendum)
Transition of Care [facility]) - Progression Note    Patient Details  Name: Maria [name] MRN: [id number] Date of Birth: 05-01-1956  Transition [facility]) CM/SW Contact  Otelia Santee, LCSW Phone Number: 10/13/2023, 3:00 PM  Clinical Narrative:    Met with pt to review bed offers for SNF. Pt requests to see if [facility] able to extend bed offer. CSW contacted [facility] to determine if bed available and have requested for determination if pt is in her co-pay days for SNF. CSW awaiting response.  Pt reports she will not be able to go to SNF if in her co-pay days as she is not able to afford co-pays. Pt becomes tearful at thought of going home due to recent trauma of falling and being stuck on the floor alone. Pt also discussed her experiences with having visual hallucinations and stress that this has caused her. CSW discussed meeting with hospital chaplain for ongoing support. Pt agreeable to this. Spiritual care has been contacted to meet with pt.    ADDENDUM: Per [facility] "pt has had a 55 day wellness period. She has 4 full days before copays. Her copays would be $204 and facility needs 14 days upfront."   CSW relayed this information to pt. Pt shares she will not be able to afford the daily copay amount. Pt shares she is currently active w/ [facility] for HHPT. She states they currently only come out one day a week and would like to see if visits can be increased if she is to return home.    Expected Discharge Plan: Skilled Nursing Facility Barriers to Discharge: SNF Pending bed offer  Expected Discharge Plan and Services In-house Referral: NA Discharge Planning Services: CM Consult Post Acute Care Choice: Skilled Nursing Facility Living arrangements for the past 2 months: Apartment                 DME Arranged: N/A DME Agency: NA       HH Arranged: NA HH Agency: NA         Social Determinants of Health (SDOH) Interventions SDOH Screenings   Food Insecurity: No Food  Insecurity (10/09/2023)  Recent Concern: Food Insecurity - Food Insecurity Present (09/06/2023)  Housing: Low Risk  (10/09/2023)  Transportation Needs: No Transportation Needs (10/09/2023)  Utilities: Not At Risk (10/09/2023)  Alcohol Screen: Low Risk  (12/30/2021)  Depression (PHQ2-9): Medium Risk (01/30/2022)  Financial Resource Strain: Medium Risk (12/30/2021)  Physical Activity: Inactive (12/30/2021)  Social Connections: Unknown (09/17/2022)   [facility]  Stress: No Stress Concern Present (12/30/2021)  Tobacco Use: Medium Risk (10/09/2023)    Readmission Risk Interventions    09/06/2023    1:10 PM 07/22/2022    2:59 PM 04/11/2021   11:40 AM  Readmission Risk Prevention Plan  Transportation Screening Complete Complete Complete  PCP or Specialist Appt within 3-5 Days Complete Complete Complete  HRI or Home Care Consult Complete Complete Complete  Social Work Consult for Recovery Care Planning/Counseling Complete Complete Complete  Palliative Care Screening Not Applicable Complete Not Applicable  Medication Review Oceanographer) Complete Complete Complete

## 2023-10-13 NOTE — Progress Notes (Signed)
PROGRESS NOTE    Maria [name]  HQI:[id number] DOB: Oct 03, 1956 DOA: 10/09/2023 PCP: Ignatius Specking, NP   Brief Narrative:  67 year old female with past medical history of chronic kidney disease stage IIIa, lymphedema, hypertension, gastroesophageal reflux disease, obstructive sleep apnea, diastolic congestive heart failure (Echo 01/2023 EF 65-70% with G2DD) who presented to [facility] emergency department with generalized weakness and reports of intermittent hallucinations.   Of note, patient was recently admitted to [facility] from 9/9 until 9/16 for generalized weakness and worsening bilateral leg pain.  Attempts to disposition the patient to a skilled nursing facility after this hospitalization failed and patient was instead discharged home with home health services.   On this presentation, patient was noted to have acute kidney injury with creatinine of 1.85 (baseline 1.1 to 1.2).  Details below.  Assessment & Plan:   Principal Problem:   Acute renal failure superimposed on stage 3a chronic kidney disease (HCC) Active Problems:   Physical deconditioning   Lymphedema   Chronic diastolic CHF (congestive heart failure) (HCC)   Type 2 diabetes mellitus with stage 3a chronic kidney disease, without long-term current use of insulin (HCC)   Hallucinations   Chronic pain syndrome   GERD without esophagitis  Acute renal failure superimposed on stage 3a chronic kidney disease (HCC) Improved and back to baseline of 1.2 after IV hydration.  Perhaps she is overdiuresed, diuretics were held but now she had pulm edema so diuretics were resumed at low-dose.   Physical deconditioning Patient continues to complain of severe generalized weakness contributing to her repeated presentations and hospitalizations over the past year . During the last hospitalization in September patient was slated to be discharged to skilled nursing facility however this failed because patient had to pay some  co-pay out-of-pocket which she could not afford and patient eventually had to be discharged home with home health services.  PT OT yet again recommended SNF.  TOC on board, insurance authorization pending.    Acute on chronic diastolic CHF (congestive heart failure) (HCC)/chronic lymphedema Patient exhibits bilateral lower extremity edema however this is chronic due to known lymphedema.  However yesterday morning she was noted to be hypoxic requiring 2 L of oxygen and she is complaining of shortness of breath.  I repeated chest x-ray which showed vascular congestion.  BNP was elevated.  Gave her 1 dose of IV Lasix 40 mg.  She is feeling much better today and she is off of oxygen.  I have now resumed her home dose of Aldactone and torsemide at half the dose.    Type 2 diabetes mellitus with stage 3a chronic kidney disease, without long-term current use of insulin (HCC) Hemoglobin A1c 7/25 6.0% Resume Mounjaro at discharge but currently on hold. Accu-Cheks before every meal and nightly with sliding scale insulin   Hallucinations Patient reports hallucinations prior to arrival however patient has not exhibited any such symptoms since admission.  Seen by psychiatry, no change in medications recommended.  They recommended outpatient follow-up with neurology and ophthalmology.   Chronic pain syndrome Resumed all PTA medications.   GERD without esophagitis Continue Protonix  Morbid obesity: Weight loss and dietary modification counseled.  DVT prophylaxis: Lovenox   Code Status: Full Code  Family Communication:  None present at bedside.  Plan of care discussed with patient in length and he/she verbalized understanding and agreed with it.  Status is: Observation The patient will require care spanning > 2 midnights and should be moved to inpatient because: Waiting  for TOC to verify from insurance company if she will be covered 100% of the cost of rehabilitation, if not, then plan to discharge her  home today.   Estimated body mass index is 59.28 kg/m as calculated from the following:   Height as of this encounter: 5\' 3"  (1.6 m).   Weight as of this encounter: 151.8 kg.    Nutritional Assessment: Body mass index is 59.28 kg/m.Marland Kitchen Seen by dietician.  I agree with the assessment and plan as outlined below: Nutrition Status:        . Skin Assessment: I have examined the patient's skin and I agree with the wound assessment as performed by the wound care RN as outlined below:    Consultants:  None  Procedures:  None  Antimicrobials:  Anti-infectives (From admission, onward)    None         Subjective: Patient seen and examined.  She has no complaints.  Denies any shortness of breath.    Objective: Vitals:   10/12/23 1303 10/12/23 1305 10/12/23 2120 10/13/23 0555  BP: (!) 166/56 117/63 103/80 (!) 149/54  Pulse: 81 80 74 75  Resp: 18  18 18   Temp: 98.3 F (36.8 C)  98.4 F (36.9 C) 98.4 F (36.9 C)  TempSrc: Oral  Oral Oral  SpO2: 94% 96% 100% 100%  Weight:      Height:        Intake/Output Summary (Last 24 hours) at 10/13/2023 1241 Last data filed at 10/13/2023 1610 Gross per 24 hour  Intake 480 ml  Output 4200 ml  Net -3720 ml   Filed Weights   10/09/23 1137 10/09/23 2138  Weight: 136 kg (!) 151.8 kg    Examination:  General exam: Appears calm and comfortable, morbidly obese Respiratory system: Clear to auscultation. Respiratory effort normal. Cardiovascular system: S1 & S2 heard, RRR. No JVD, murmurs, rubs, gallops or clicks.  Bilateral lower extremity lymphedema. Gastrointestinal system: Abdomen is nondistended, soft and nontender. No organomegaly or masses felt. Normal bowel sounds heard. Central nervous system: Alert and oriented. No focal neurological deficits. Extremities: Symmetric 5 x 5 power. Skin: No rashes, lesions or ulcers.  Psychiatry: Judgement and insight appear normal. Mood & affect appropriate.   Data Reviewed: I have  personally reviewed following labs and imaging studies  CBC: Recent Labs  Lab 10/09/23 1100 10/10/23 0609 10/11/23 0645 10/12/23 1824  WBC 15.3* 6.9 9.4 8.6  NEUTROABS 12.6*  --  6.5 5.8  HGB 11.8* 10.1* 10.7* 10.2*  HCT 36.9 32.3* 35.1* 32.0*  MCV 94.4 95.3 97.5 93.8  PLT 310 230 253 243   Basic Metabolic Panel: Recent Labs  Lab 10/09/23 1100 10/10/23 0609 10/11/23 0645 10/12/23 1824  NA 135 138 135 136  K 5.2* 4.0 3.9 3.9  CL 101 104 100 101  CO2 25 27 26 28   GLUCOSE 98 96 100* 144*  BUN 18 16 12 14   CREATININE 1.85* 1.43* 1.17* 1.07*  CALCIUM 9.1 8.6* 8.2* 8.1*  MG  --   --  2.3  --    GFR: Estimated Creatinine Clearance: 75.3 mL/min (A) (by C-G formula based on SCr of 1.07 mg/dL (H)). Liver Function Tests: Recent Labs  Lab 10/09/23 1100 10/11/23 0645  AST 21 16  ALT 17 14  ALKPHOS 81 67  BILITOT 1.4* 1.3*  PROT 7.5 6.1*  ALBUMIN 3.8 3.2*   Recent Labs  Lab 10/09/23 1100  LIPASE 26   No results for input(s): "AMMONIA" in the last 168  hours. Coagulation Profile: No results for input(s): "INR", "PROTIME" in the last 168 hours. Cardiac Enzymes: No results for input(s): "CKTOTAL", "CKMB", "CKMBINDEX", "TROPONINI" in the last 168 hours. BNP (last 3 results) No results for input(s): "PROBNP" in the last 8760 hours. HbA1C: No results for input(s): "HGBA1C" in the last 72 hours. CBG: Recent Labs  Lab 10/12/23 1153 10/12/23 1621 10/12/23 2123 10/13/23 0736 10/13/23 1156  GLUCAP 140* 110* 122* 88 123*   Lipid Profile: No results for input(s): "CHOL", "HDL", "LDLCALC", "TRIG", "CHOLHDL", "LDLDIRECT" in the last 72 hours. Thyroid Function Tests: No results for input(s): "TSH", "T4TOTAL", "FREET4", "T3FREE", "THYROIDAB" in the last 72 hours. Anemia Panel: Recent Labs    10/11/23 0645  VITAMINB12 217   Sepsis Labs: Recent Labs  Lab 10/12/23 1824  PROCALCITON <0.10    Recent Results (from the past 240 hour(s))  SARS Coronavirus 2 by RT PCR  (hospital order, performed in [facility] hospital lab) *cepheid single result test* Anterior Nasal Swab     Status: None   Collection Time: 10/09/23 11:00 AM   Specimen: Anterior Nasal Swab  Result Value Ref Range Status   SARS Coronavirus 2 by RT PCR NEGATIVE NEGATIVE Final    Comment: (NOTE) SARS-CoV-2 target nucleic acids are NOT DETECTED.  The SARS-CoV-2 RNA is generally detectable in upper and lower respiratory specimens during the acute phase of infection. The lowest concentration of SARS-CoV-2 viral copies this assay can detect is 250 copies / mL. A negative result does not preclude SARS-CoV-2 infection and should not be used as the sole basis for treatment or other patient management decisions.  A negative result may occur with improper specimen collection / handling, submission of specimen other than nasopharyngeal swab, presence of viral mutation(s) within the areas targeted by this assay, and inadequate number of viral copies (<250 copies / mL). A negative result must be combined with clinical observations, patient history, and epidemiological information.  Fact Sheet for Patients:   RoadLapTop.co.za  Fact Sheet for Healthcare Providers: http://kim-miller.com/  This test is not yet approved or  cleared by the [place] FDA and has been authorized for detection and/or diagnosis of SARS-CoV-2 by FDA under an Emergency Use Authorization (EUA).  This EUA will remain in effect (meaning this test can be used) for the duration of the COVID-19 declaration under Section 564(b)(1) of the Act, 21 U.S.C. section 360bbb-3(b)(1), unless the authorization is terminated or revoked sooner.  Performed at [facility], [address] [place], [place] [address]   Urine Culture     Status: None   Collection Time: 10/09/23 11:00 AM   Specimen: Urine, Random  Result Value Ref Range Status   Specimen Description   Final     URINE, RANDOM Performed at [facility], [address] [place], [place] [address]    Special Requests   Final    NONE Reflexed from [id number] Performed at [facility], [address] [place], [place] [address]    Culture   Final    NO GROWTH Performed at [facility], [address] [place], [place] [address]    Report Status 10/11/2023 FINAL  Final     Radiology Studies: DG CHEST PORT 1 VIEW  Result Date: 10/12/2023 CLINICAL DATA:  CHF EXAM: PORTABLE CHEST 1 VIEW COMPARISON:  Chest x-ray 10/09/2023 FINDINGS: The heart is enlarged. There central pulmonary vascular congestion. There is no lung consolidation, pleural effusion or pneumothorax. No acute fractures are seen.  IMPRESSION: Cardiomegaly with central pulmonary vascular congestion. Electronically Signed   By: Darliss Cheney M.D.   On: 10/12/2023 15:34    Scheduled Meds:  Chlorhexidine Gluconate Cloth  6 each Topical Daily   cyanocobalamin  1,000 mcg Oral Daily   DULoxetine  20 mg Oral Daily   enoxaparin (LOVENOX) injection  75 mg Subcutaneous Q24H   famotidine  40 mg Oral Daily   fluticasone  1 spray Each Nare Daily   insulin aspart  0-20 Units Subcutaneous TID WC   metoprolol succinate  25 mg Oral Daily   pantoprazole  40 mg Oral BID   pramipexole  0.5 mg Oral TID   sodium chloride flush  3 mL Intravenous Q12H   spironolactone  12.5 mg Oral Daily   torsemide  20 mg Oral Daily   Continuous Infusions:   LOS: 0 days   Hughie Closs, MD [facility]  10/13/2023, 12:41 PM   *Please note that this is a verbal dictation therefore any spelling or grammatical errors are due to the "Dragon Medical One" system interpretation.  Please page via Amion and do not message via secure chat for urgent patient care matters. Secure chat can be used for non urgent patient care matters.  How to contact the [facility] Attending or Consulting provider 7A - 7P or covering provider during  after hours 7P -7A, for this patient?  Check the care team in [facility] and look for a) attending/consulting TRH provider listed and b) the [facility] team listed. Page or secure chat 7A-7P. Log into [email or web address] and use [facility]'s universal password to access. If you do not have the password, please contact the hospital operator. Locate the [facility] provider you are looking for under [facility] and page to a number that you can be directly reached. If you still have difficulty reaching the provider, please page the [facility] (Director on Call) for the Hospitalists listed on amion for assistance.

## 2023-10-13 NOTE — Progress Notes (Signed)
Physical Therapy Treatment Patient Details Name: Maria [name] MRN: [id number] DOB: Jan 03, 1956 Today's Date: 10/13/2023   History of Present Illness Pt is a 67 y.o. femlae presenting to ED with generalized weakness and reports of intermittent hallucinations. Recently admitted to [facility] from 9/9 until 9/16 for generalized weakness and worsening bilateral leg pain.PMH significant for chronic kidney disease stage IIIa, lymphedema, hypertension, gastroesophageal reflux disease, obstructive sleep apnea, diastolic congestive heart failure (Echo 01/2023 EF 65-70%    PT Comments  Pt admitted with above diagnosis.  Pt currently with functional limitations due to the deficits listed below (see PT Problem List). Pt in bed when PT arrived. Pt agreeable to therapy intervention and focus on ambulation today. Pt may not be able to transition to SNF at this time and may be required to return home with [facility] services and is motivated to increase IND. Pt required S, increased time, min cues and use of hospital bed for supine to sit, pt required min A x 2 for sit to stand  from elevated EOB, pt able to progress to 17 feet with bariatric RW, CGA x2 for safety and recliner close. Pt desaturated on RA with gait tasks to 88% and required 40s to recover once seated to 90% on RA with cues for pursed lip breathing. Pt indicates B distal LE pain and B knee pain. Pt elected to sit in recliner, all needs in place. Pt will benefit from acute skilled PT to increase their independence and safety with mobility to allow discharge.      If plan is discharge home, recommend the following: Help with stairs or ramp for entrance;Assist for transportation;Assistance with cooking/housework;A lot of help with walking and/or transfers;A lot of help with bathing/dressing/bathroom   Can travel by private vehicle     No  Equipment Recommendations  None recommended by PT    Recommendations for Other Services       Precautions /  Restrictions Precautions Precautions: Fall Restrictions Weight Bearing Restrictions: No     Mobility  Bed Mobility Overal bed mobility: Needs Assistance Bed Mobility: Supine to Sit     Supine to sit: Supervision, HOB elevated, Used rails     General bed mobility comments: min cues and increased time with use of hospital bed    Transfers Overall transfer level: Needs assistance Equipment used: Rolling walker (2 wheels) Transfers: Sit to/from Stand Sit to Stand: +2 physical assistance, +2 safety/equipment, From elevated surface, Contact guard assist           General transfer comment: pt demonstrated increased I with sit to stand from EOB with cues for power up and encouragement, mild instability noted with immediate standing    Ambulation/Gait Ambulation/Gait assistance: Contact guard assist, +2 safety/equipment Gait Distance (Feet): 17 Feet Assistive device: Rolling walker (2 wheels) Gait Pattern/deviations: Step-to pattern, Shuffle, Wide base of support Gait velocity: decreased     General Gait Details: limited B foot clearance encouragement for gait in hallway and O2 saturation decreased to 88% on RA and 40s to recover to 90% once seated with cues for pursed lip breathing.   Stairs             Wheelchair Mobility     Tilt Bed    Modified Rankin (Stroke Patients Only)       Balance Overall balance assessment: Mild deficits observed, not formally tested  Cognition Arousal: Alert Behavior During Therapy: WFL for tasks assessed/performed Overall Cognitive Status: Within Functional Limits for tasks assessed                                          Exercises      General Comments        Pertinent Vitals/Pain Pain Assessment Pain Assessment: 0-10 Pain Score: 4  Pain Location: B distal LE and knees Pain Descriptors / Indicators: Aching, Discomfort Pain  Intervention(s): Monitored during session    Home Living Family/patient expects to be discharged to:: Private residence Living Arrangements: Alone Available Help at Discharge: Other (Comment) Type of Home: Apartment Home Access: Stairs to enter Entrance Stairs-Rails: Right Entrance Stairs-Number of Steps: 6   Home Layout: One level Home Equipment: Rollator (4 wheels);Cane - single point;BSC/3in1;Shower seat;Hand held shower head;Grab bars - tub/shower;Adaptive equipment;Rolling Walker (2 wheels);Hospital bed;Tub bench Additional Comments: pt has lost her mobility after last fall onto her knees    Prior Function            PT Goals (current goals can now be found in the care plan section) Acute Rehab PT Goals Patient Stated Goal: Stop having hallucinations and improve strength and mobility PT Goal Formulation: With patient Time For Goal Achievement: 10/25/23 Potential to Achieve Goals: Good Progress towards PT goals: Progressing toward goals    Frequency    Min 1X/week      PT Plan      Co-evaluation              AM-PAC PT "6 Clicks" Mobility   Outcome Measure  Help needed turning from your back to your side while in a flat bed without using bedrails?: A Little Help needed moving from lying on your back to sitting on the side of a flat bed without using bedrails?: A Little Help needed moving to and from a bed to a chair (including a wheelchair)?: A Lot Help needed standing up from a chair using your arms (e.g., wheelchair or bedside chair)?: A Lot Help needed to walk in hospital room?: A Little Help needed climbing 3-5 steps with a railing? : Total 6 Click Score: 14    End of Session Equipment Utilized During Treatment: Gait belt Activity Tolerance: Patient limited by pain Patient left: with call bell/phone within reach;in chair Nurse Communication: Mobility status PT Visit Diagnosis: Muscle weakness (generalized) (M62.81);Difficulty in walking, not  elsewhere classified (R26.2);Pain Pain - Right/Left: Right (and L) Pain - part of body: Knee;Leg;Ankle and joints of foot     Time: 1525-1551 PT Time Calculation (min) (ACUTE ONLY): 26 min  Charges:    $Gait Training: 8-22 mins $Therapeutic Activity: 8-22 mins PT General Charges $$ ACUTE PT VISIT: 1 Visit                     Johnny Bridge, PT Acute Rehab    Jacqualyn Posey 10/13/2023, 4:27 PM

## 2023-10-14 DIAGNOSIS — N179 Acute kidney failure, unspecified: Secondary | ICD-10-CM | POA: Diagnosis not present

## 2023-10-14 DIAGNOSIS — N1831 Chronic kidney disease, stage 3a: Secondary | ICD-10-CM | POA: Diagnosis not present

## 2023-10-14 LAB — BASIC METABOLIC PANEL
Anion gap: 11 (ref 5–15)
BUN: 15 mg/dL (ref 8–23)
CO2: 27 mmol/L (ref 22–32)
Calcium: 8.4 mg/dL — ABNORMAL LOW (ref 8.9–10.3)
Chloride: 101 mmol/L (ref 98–111)
Creatinine, Ser: 1.06 mg/dL — ABNORMAL HIGH (ref 0.44–1.00)
GFR, Estimated: 58 mL/min — ABNORMAL LOW (ref >60)
Glucose, Bld: 97 mg/dL (ref 70–99)
Potassium: 3.9 mmol/L (ref 3.5–5.1)
Sodium: 139 mmol/L (ref 135–145)

## 2023-10-14 LAB — GLUCOSE, CAPILLARY
Glucose-Capillary: 95 mg/dL (ref 70–99)
Glucose-Capillary: 139 mg/dL — ABNORMAL HIGH (ref 70–99)

## 2023-10-14 NOTE — Progress Notes (Signed)
Physical Therapy Treatment Patient Details Name: Maria [name] Nier MRN: [id number] DOB: February 05, 1956 Today's Date: 10/14/2023   History of Present Illness Pt is a 67 y.o. femlae presenting to ED with generalized weakness and reports of intermittent hallucinations. Recently admitted to [facility] from 9/9 until 9/16 for generalized weakness and worsening bilateral leg pain.PMH significant for chronic kidney disease stage IIIa, lymphedema, hypertension, gastroesophageal reflux disease, obstructive sleep apnea, diastolic congestive heart failure (Echo 01/2023 EF 65-70%    PT Comments  Pt OOB in recliner.  General Comments: AxO x 3 very pleasant and willing.  Feeling "better". Assisted with amb in hallway went well. Pt tolerated a functional distance of 30 feet x 2.  LPT has rec SNF however due to financial stress, pt plans to return home.  Pt has a walker.   If plan is discharge home, recommend the following: Help with stairs or ramp for entrance;Assist for transportation;Assistance with cooking/housework;A lot of help with walking and/or transfers;A lot of help with bathing/dressing/bathroom   Can travel by private vehicle     No  Equipment Recommendations  None recommended by PT    Recommendations for Other Services       Precautions / Restrictions Precautions Precautions: Fall Restrictions Weight Bearing Restrictions: No     Mobility  Bed Mobility               General bed mobility comments: OOB in recliner    Transfers Overall transfer level: Needs assistance Equipment used: Rolling walker (2 wheels) Transfers: Sit to/from Stand Sit to Stand: Supervision           General transfer comment: pt was able to self rise with good safety cognition.    Ambulation/Gait Ambulation/Gait assistance: Supervision, Contact guard assist Gait Distance (Feet): 60 Feet (30 feet x 2 with one seated rest break) Assistive device: Rolling walker (2 wheels) Gait Pattern/deviations:  Step-to pattern, Shuffle, Wide base of support Gait velocity: decreased   Pre-gait activities: decreased General Gait Details: pt tolerated an increased distance using walker.  Recliner following for safety.   Stairs             Wheelchair Mobility     Tilt Bed    Modified Rankin (Stroke Patients Only)       Balance                                            Cognition Arousal: Alert Behavior During Therapy: WFL for tasks assessed/performed Overall Cognitive Status: Within Functional Limits for tasks assessed                                 General Comments: AxO x 3 very pleasant and willing.  Feeling "better".        Exercises      General Comments        Pertinent Vitals/Pain Pain Assessment Pain Assessment: No/denies pain    Home Living                          Prior Function            PT Goals (current goals can now be found in the care plan section) Progress towards PT goals: Progressing toward goals    Frequency    Min 1X/week  PT Plan      Co-evaluation              AM-PAC PT "6 Clicks" Mobility   Outcome Measure  Help needed turning from your back to your side while in a flat bed without using bedrails?: None Help needed moving from lying on your back to sitting on the side of a flat bed without using bedrails?: None Help needed moving to and from a bed to a chair (including a wheelchair)?: None Help needed standing up from a chair using your arms (e.g., wheelchair or bedside chair)?: None Help needed to walk in hospital room?: None Help needed climbing 3-5 steps with a railing? : A Little 6 Click Score: 23    End of Session Equipment Utilized During Treatment: Gait belt Activity Tolerance: Patient tolerated treatment well   Nurse Communication: Mobility status PT Visit Diagnosis: Muscle weakness (generalized) (M62.81);Difficulty in walking, not elsewhere classified  (R26.2);Pain Pain - Right/Left: Right Pain - part of body: Knee;Leg;Ankle and joints of foot     Time: 1120-1140 PT Time Calculation (min) (ACUTE ONLY): 20 min  Charges:    $Gait Training: 8-22 mins PT General Charges $$ ACUTE PT VISIT: 1 Visit                     Felecia Shelling  PTA Acute  Rehabilitation Services Office M-F          [phone]

## 2023-10-14 NOTE — Progress Notes (Signed)
Patient is alert and oriented. Dressed and ready to be picked up for discharge home. Last vitals WNL and condition stable. IV already out and discharge instructions were provided by day RN.Priscille Kluver

## 2023-10-14 NOTE — Discharge Summary (Signed)
Physician Discharge Summary  Maria [name] XLK:[id number] DOB: June 29, 1956 DOA: 10/09/2023  PCP: Ignatius Specking, NP  Admit date: 10/09/2023 Discharge date: 10/14/2023 30 Day Unplanned Readmission Risk Score    Flowsheet Row ED to Hosp-Admission (Discharged) from 08/30/2023 in [facility]  30 Day Unplanned Readmission Risk Score (%) 28 Filed at 09/06/2023 2000       This score is the patient's risk of an unplanned readmission within 30 days of being discharged (0 -100%). The score is based on dignosis, age, lab data, medications, orders, and past utilization.   Low:  0-14.9   Medium: 15-21.9   High: 22-29.9   Extreme: 30 and above          Admitted From: Home Disposition: Home  Recommendations for Outpatient Follow-up:  Follow up with PCP in 1-2 weeks Please obtain BMP/CBC in one week Please seek referral from your PCP and follow-up with neurology and ophthalmology as outpatient. Please follow up with your PCP on the following pending results: Unresulted Labs (From admission, onward)     Start     Ordered   10/16/23 0500  Creatinine, serum  (enoxaparin (LOVENOX)    CrCl >/= 30 ml/min)  Weekly,   R     Comments: while on enoxaparin therapy    10/09/23 1827   10/14/23 0801  Basic metabolic panel  ONCE - URGENT,   URGENT        10/14/23 0800   10/12/23 1115  CBC with Differential/Platelet  Once,   R        10/12/23 1115              Home Health: Yes Equipment/Devices: None-she has all DME already available  Discharge Condition: Stable CODE STATUS: Full code Diet recommendation: Low-sodium/cardiac  Subjective: Seen and examined.  No shortness of breath.  Other than generalized weakness, no other complaint.  Still very nervous about going home but she understands that unfortunately, we do not have any other option as the insurance company wants her to pay co-pay and she cannot afford to pay for that.  Brief/Interim Summary:  67 year old female with past medical history of chronic kidney disease stage IIIa, lymphedema, hypertension, gastroesophageal reflux disease, obstructive sleep apnea, diastolic congestive heart failure (Echo 01/2023 EF 65-70% with G2DD) who presented to [facility] emergency department with generalized weakness and reports of intermittent hallucinations.   Of note, patient was recently admitted to [facility] from 9/9 until 9/16 for generalized weakness and worsening bilateral leg pain.  Attempts to disposition the patient to a skilled nursing facility after this hospitalization failed and patient was instead discharged home with home health services.   On this presentation, patient was noted to have acute kidney injury with creatinine of 1.85 (baseline 1.1 to 1.2).  Details below.   Acute renal failure superimposed on stage 3a chronic kidney disease (HCC) Improved and back to baseline of 1.2 after IV hydration.  Perhaps she is overdiuresed, diuretics were held initially, she was IV hydrated, renal function improved and back to baseline and has remained at baseline for last 3 days.   Physical deconditioning Patient continues to complain of severe generalized weakness contributing to her repeated presentations and hospitalizations over the past year . During the last hospitalization in September patient was slated to be discharged to skilled nursing facility however this failed because patient had to pay some co-pay out-of-pocket which she could not afford and patient eventually had to be  discharged home with home health services.  PT OT yet again recommended SNF.  TOC started insurance authorization however Per [employer] "pt has had a 55 day wellness period. She has 4 full days before copays. Her copays would be $204 and facility needs 14 days upfront." CSW relayed this information to pt. Pt shares she will not be able to afford the daily copay amount. Pt shares she is currently active w/  [facility] for HHPT. She states they currently only come out one day a week and would like to see if visits can be increased if she is to return home.    Acute on chronic diastolic CHF (congestive heart failure) (HCC)/chronic lymphedema Patient exhibits bilateral lower extremity edema however this is chronic due to known lymphedema.  However on 10/12/23, she was noted to be hypoxic requiring 2 L of oxygen and she is complaining of shortness of breath.  I repeated chest x-ray which showed vascular congestion.  BNP was elevated.  Gave her 1 dose of IV Lasix 40 mg. We are discharging her on lower dose of her torsemide.   Type 2 diabetes mellitus with stage 3a chronic kidney disease, without long-term current use of insulin (HCC) Hemoglobin A1c 7/25 6.0% Resume Mounjaro at discharge    Hallucinations Patient reports hallucinations prior to arrival however patient has not exhibited any such symptoms since admission.  Seen by psychiatry, no change in medications recommended.  They recommended outpatient follow-up with neurology and ophthalmology.   Chronic pain syndrome Resumed all PTA medications.   GERD without esophagitis Continue Protonix   Morbid obesity: Weight loss and dietary modification counseled.  Discharge plan was discussed with patient and/or family member and they verbalized understanding and agreed with it.  Discharge Diagnoses:  Principal Problem:   Acute renal failure superimposed on stage 3a chronic kidney disease (HCC) Active Problems:   Physical deconditioning   Lymphedema   Chronic diastolic CHF (congestive heart failure) (HCC)   Type 2 diabetes mellitus with stage 3a chronic kidney disease, without long-term current use of insulin (HCC)   Hallucinations   Chronic pain syndrome   GERD without esophagitis    Discharge Instructions   Allergies as of 10/14/2023       Reactions   Amoxicillin-pot Clavulanate Nausea And Vomiting   Projectile vomiting Did it involve  swelling of the face/tongue/throat, SOB, or low BP? No Did it involve sudden or severe rash/hives, skin peeling, or any reaction on the inside of your mouth or nose? No Did you need to seek medical attention at a hospital or doctor's office? No When did it last happen?      20-30 years If all above answers are "NO", may proceed with cephalosporin use.   Adhesive [tape] Other (See Comments)   Tears skin off - use paper tape    Codeine Other (See Comments)   hallucinations   Crestor [rosuvastatin Calcium]    Severe muscle cramps   Gabapentin Other (See Comments)   Trembling   Lyrica [pregabalin] Other (See Comments)   weakness   Morphine And Codeine Nausea And Vomiting, Other (See Comments)   Migraine headaches   Vicodin [hydrocodone-acetaminophen] Other (See Comments)   hallucinations   Latex Rash        Medication List     STOP taking these medications    sulfamethoxazole-trimethoprim 800-160 MG tablet Commonly known as: BACTRIM DS       TAKE these medications    Accu-Chek Guide test strip Generic drug: glucose blood  Use as instructed up to 4 times daily   Accu-Chek Guide w/Device Kit Use as directed What changed:  how much to take how to take this when to take this additional instructions   albuterol 108 (90 Base) MCG/ACT inhaler Commonly known as: VENTOLIN HFA Inhale 1 puff into the lungs every 6 (six) hours as needed for wheezing or shortness of breath.   cholecalciferol 25 MCG (1000 UNIT) tablet Commonly known as: VITAMIN D3 Take 2,000 Units by mouth daily.   clonazePAM 1 MG tablet Commonly known as: KLONOPIN Take 1 tablet (1 mg total) by mouth 2 (two) times daily as needed. for anxiety What changed: reasons to take this   cyanocobalamin 1000 MCG tablet Commonly known as: VITAMIN B12 Take 1 tablet (1,000 mcg total) by mouth daily.   DULoxetine 20 MG capsule Commonly known as: CYMBALTA Take 20 mg by mouth daily.   famotidine 40 MG  tablet Commonly known as: PEPCID Take 40 mg by mouth daily.   fluticasone 50 MCG/ACT nasal spray Commonly known as: FLONASE Place 1 spray into both nostrils daily.   Metamucil Fiber Chew Chew 3 tablets by mouth at bedtime.   metoprolol succinate 25 MG 24 hr tablet Commonly known as: TOPROL-XL Take 1 tablet (25 mg total) by mouth daily.   Mounjaro 10 MG/0.5ML Pen Generic drug: tirzepatide Inject 10 mg into the skin once a week. Friday   Narcan 4 MG/0.1ML Liqd nasal spray kit Generic drug: naloxone Place 0.4 mg into the nose daily as needed (opioid overdose).   ondansetron 4 MG disintegrating tablet Commonly known as: ZOFRAN-ODT Take 4 mg by mouth every 8 (eight) hours as needed for nausea or vomiting.   Oxycodone HCl 10 MG Tabs Take 1 tablet (10 mg total) by mouth 4 (four) times daily. Per [facility] What changed:  when to take this reasons to take this   pantoprazole 40 MG tablet Commonly known as: PROTONIX TAKE ONE TABLET BY MOUTH DAILY What changed: when to take this   potassium chloride SA 20 MEQ tablet Commonly known as: KLOR-CON M Take 2 tablets (40 mEq total) by mouth 2 (two) times daily. What changed:  how much to take when to take this additional instructions   pramipexole 0.5 MG tablet Commonly known as: MIRAPEX Take 1 tablet (0.5 mg total) by mouth 3 (three) times daily.   spironolactone 25 MG tablet Commonly known as: ALDACTONE Take 0.5 tablets (12.5 mg total) by mouth daily. What changed: how much to take   torsemide 20 MG tablet Commonly known as: DEMADEX Take 1 tablet (20 mg total) by mouth daily. What changed: how much to take        Follow-up Information     Ignatius Specking, NP Follow up in 1 week(s).   Specialty: Nurse Practitioner Contact information: [address] [place] [address] [phone]         Ignatius Specking, NP Follow up.   Specialty: Nurse Practitioner Contact information: [address] [place] [address] [phone]                Allergies  Allergen Reactions   Amoxicillin-Pot Clavulanate Nausea And Vomiting    Projectile vomiting Did it involve swelling of the face/tongue/throat, SOB, or low BP? No Did it involve sudden or severe rash/hives, skin peeling, or any reaction on the inside of your mouth or nose? No Did you need to seek medical attention at a hospital  or doctor's office? No When did it last happen?      20-30 years If all above answers are "NO", may proceed with cephalosporin use.    Adhesive [Tape] Other (See Comments)    Tears skin off - use paper tape    Codeine Other (See Comments)    hallucinations   Crestor [Rosuvastatin Calcium]     Severe muscle cramps   Gabapentin Other (See Comments)    Trembling   Lyrica [Pregabalin] Other (See Comments)    weakness   Morphine And Codeine Nausea And Vomiting and Other (See Comments)    Migraine headaches   Vicodin [Hydrocodone-Acetaminophen] Other (See Comments)    hallucinations   Latex Rash    Consultations: Psychiatry   Procedures/Studies: DG CHEST PORT 1 VIEW  Result Date: 10/12/2023 CLINICAL DATA:  CHF EXAM: PORTABLE CHEST 1 VIEW COMPARISON:  Chest x-ray 10/09/2023 FINDINGS: The heart is enlarged. There central pulmonary vascular congestion. There is no lung consolidation, pleural effusion or pneumothorax. No acute fractures are seen. IMPRESSION: Cardiomegaly with central pulmonary vascular congestion. Electronically Signed   By: Darliss Cheney M.D.   On: 10/12/2023 15:34   CT Head Wo Contrast  Result Date: 10/09/2023 CLINICAL DATA:  Mental status change, unknown cause. EXAM: CT HEAD WITHOUT CONTRAST TECHNIQUE: Contiguous axial images were obtained from the base of the skull through the vertex without intravenous contrast. RADIATION DOSE REDUCTION: This exam was performed according to the departmental dose-optimization program which includes automated exposure control,  adjustment of the mA and/or kV according to patient size and/or use of iterative reconstruction technique. COMPARISON:  CT scan head from 07/15/2023. FINDINGS: Brain: No evidence of acute infarction, hemorrhage, hydrocephalus, extra-axial collection or mass lesion/mass effect. Ventricles are normal. Cerebral volume is age appropriate. Vascular: No hyperdense vessel or unexpected calcification. Intracranial arteriosclerosis. Skull: Normal. Negative for fracture or focal lesion. Note is made of hyperostosis frontalis interna. Sinuses/Orbits: No acute finding. Other: Visualized mastoid air cells are unremarkable. No mastoid effusion. IMPRESSION: *No acute intracranial abnormality. Electronically Signed   By: Jules Schick M.D.   On: 10/09/2023 14:42   CT ABDOMEN PELVIS WO CONTRAST  Result Date: 10/09/2023 CLINICAL DATA:  Abdominal pain, acute, nonlocalized EXAM: CT ABDOMEN AND PELVIS WITHOUT CONTRAST TECHNIQUE: Multidetector CT imaging of the abdomen and pelvis was performed following the standard protocol without IV contrast. RADIATION DOSE REDUCTION: This exam was performed according to the departmental dose-optimization program which includes automated exposure control, adjustment of the mA and/or kV according to patient size and/or use of iterative reconstruction technique. COMPARISON:  CT scan abdomen and pelvis from 07/15/2023. FINDINGS: Lower chest: There are patchy atelectatic changes in the visualized lung bases. No overt consolidation. No pleural effusion. The heart is normal in size. No pericardial effusion. Hepatobiliary: The liver is normal in size. Non-cirrhotic configuration. No suspicious mass. No intrahepatic or extrahepatic bile duct dilation. No calcified gallstones. Normal gallbladder wall thickness. No pericholecystic inflammatory changes. Pancreas: Unremarkable. No pancreatic ductal dilatation or surrounding inflammatory changes. Spleen: Within normal limits. No focal lesion.  Adrenals/Urinary Tract: Adrenal glands are unremarkable. No suspicious renal mass. No hydronephrosis. No renal or ureteric calculi. Unremarkable urinary bladder. Stomach/Bowel: No disproportionate dilation of the small or large bowel loops. No evidence of abnormal bowel wall thickening or inflammatory changes. The appendix is unremarkable. There are scattered diverticula mainly in the sigmoid [place], without imaging signs of diverticulitis. Vascular/Lymphatic: No ascites or pneumoperitoneum. No abdominal or pelvic lymphadenopathy, by size criteria. No aneurysmal dilation of the major  abdominal arteries. There are moderate peripheral atherosclerotic vascular calcifications of the aorta and its major branches. Reproductive: The uterus is surgically absent. No large adnexal mass. Other: There is a small fat containing periumbilical hernia. The soft tissues and abdominal wall are otherwise unremarkable. Musculoskeletal: No suspicious osseous lesions. There are mild multilevel degenerative changes in the visualized spine. IMPRESSION: 1. No acute findings in the abdomen or pelvis. 2. Small fat containing periumbilical hernia. 3. Multiple other nonacute observations, as described above. Aortic Atherosclerosis (ICD10-I70.0). Electronically Signed   By: Jules Schick M.D.   On: 10/09/2023 14:37   DG Chest Portable 1 View  Result Date: 10/09/2023 CLINICAL DATA:  Weakness. EXAM: PORTABLE CHEST 1 VIEW COMPARISON:  Two-view chest x-ray 08/30/2023 FINDINGS: The heart is enlarged. Atherosclerotic changes are present at the aortic arch. Mild pulmonary vascular congestion is present without frank edema. No focal airspace disease is present. IMPRESSION: Cardiomegaly and mild pulmonary vascular congestion without frank edema. Electronically Signed   By: Marin Roberts M.D.   On: 10/09/2023 12:19     Discharge Exam: Vitals:   10/13/23 2113 10/14/23 0511  BP: (!) 148/63 (!) 116/59  Pulse: 79 77  Resp: 18 20  Temp:  (!) 97.5 F (36.4 C) 98.3 F (36.8 C)  SpO2: 94% 92%   Vitals:   10/13/23 0555 10/13/23 1332 10/13/23 2113 10/14/23 0511  BP: (!) 149/54 (!) 146/60 (!) 148/63 (!) 116/59  Pulse: 75 78 79 77  Resp: 18 20 18 20   Temp: 98.4 F (36.9 C) 98.6 F (37 C) (!) 97.5 F (36.4 C) 98.3 F (36.8 C)  TempSrc: Oral Oral Oral Oral  SpO2: 100% 90% 94% 92%  Weight:      Height:        General: Pt is alert, awake, not in acute distress, morbidly obese Cardiovascular: RRR, S1/S2 +, no rubs, no gallops Respiratory: CTA bilaterally, no wheezing, no rhonchi Abdominal: Soft, NT, ND, bowel sounds + Extremities: no edema, no cyanosis    The results of significant diagnostics from this hospitalization (including imaging, microbiology, ancillary and laboratory) are listed below for reference.     Microbiology: Recent Results (from the past 240 hour(s))  SARS Coronavirus 2 by RT PCR (hospital order, performed in [facility] hospital lab) *cepheid single result test* Anterior Nasal Swab     Status: None   Collection Time: 10/09/23 11:00 AM   Specimen: Anterior Nasal Swab  Result Value Ref Range Status   SARS Coronavirus 2 by RT PCR NEGATIVE NEGATIVE Final    Comment: (NOTE) SARS-CoV-2 target nucleic acids are NOT DETECTED.  The SARS-CoV-2 RNA is generally detectable in upper and lower respiratory specimens during the acute phase of infection. The lowest concentration of SARS-CoV-2 viral copies this assay can detect is 250 copies / mL. A negative result does not preclude SARS-CoV-2 infection and should not be used as the sole basis for treatment or other patient management decisions.  A negative result may occur with improper specimen collection / handling, submission of specimen other than nasopharyngeal swab, presence of viral mutation(s) within the areas targeted by this assay, and inadequate number of viral copies (<250 copies / mL). A negative result must be combined with  clinical observations, patient history, and epidemiological information.  Fact Sheet for Patients:   RoadLapTop.co.za  Fact Sheet for Healthcare Providers: http://kim-miller.com/  This test is not yet approved or  cleared by the [place] FDA and has been authorized for detection and/or diagnosis of SARS-CoV-2 by FDA  under an Emergency Use Authorization (EUA).  This EUA will remain in effect (meaning this test can be used) for the duration of the COVID-19 declaration under Section 564(b)(1) of the Act, 21 U.S.C. section 360bbb-3(b)(1), unless the authorization is terminated or revoked sooner.  Performed at [facility], [address] [place], [place] [address]   Urine Culture     Status: None   Collection Time: 10/09/23 11:00 AM   Specimen: Urine, Random  Result Value Ref Range Status   Specimen Description   Final    URINE, RANDOM Performed at [facility], [address] [place], [place] [address]    Special Requests   Final    NONE Reflexed from [id number] Performed at [facility], [address] [place], [place] [address]    Culture   Final    NO GROWTH Performed at [facility], [address] [place], [place] [address]    Report Status 10/11/2023 FINAL  Final     Labs: BNP (last 3 results) Recent Labs    08/30/23 1623 10/09/23 1100 10/12/23 1824  BNP 17.1 20.8 226.7*   Basic Metabolic Panel: Recent Labs  Lab 10/09/23 1100 10/10/23 0609 10/11/23 0645 10/12/23 1824  NA 135 138 135 136  K 5.2* 4.0 3.9 3.9  CL 101 104 100 101  CO2 25 27 26 28   GLUCOSE 98 96 100* 144*  BUN 18 16 12 14   CREATININE 1.85* 1.43* 1.17* 1.07*  CALCIUM 9.1 8.6* 8.2* 8.1*  MG  --   --  2.3  --    Liver Function Tests: Recent Labs  Lab 10/09/23 1100 10/11/23 0645  AST 21 16  ALT 17 14  ALKPHOS 81 67  BILITOT 1.4* 1.3*  PROT 7.5 6.1*  ALBUMIN 3.8 3.2*    Recent Labs  Lab 10/09/23 1100  LIPASE 26   No results for input(s): "AMMONIA" in the last 168 hours. CBC: Recent Labs  Lab 10/09/23 1100 10/10/23 0609 10/11/23 0645 10/12/23 1824  WBC 15.3* 6.9 9.4 8.6  NEUTROABS 12.6*  --  6.5 5.8  HGB 11.8* 10.1* 10.7* 10.2*  HCT 36.9 32.3* 35.1* 32.0*  MCV 94.4 95.3 97.5 93.8  PLT 310 230 253 243   Cardiac Enzymes: No results for input(s): "CKTOTAL", "CKMB", "CKMBINDEX", "TROPONINI" in the last 168 hours. BNP: Invalid input(s): "POCBNP" CBG: Recent Labs  Lab 10/13/23 0736 10/13/23 1156 10/13/23 1701 10/13/23 2116 10/14/23 0727  GLUCAP 88 123* 124* 102* 95   D-Dimer No results for input(s): "DDIMER" in the last 72 hours. Hgb A1c No results for input(s): "HGBA1C" in the last 72 hours. Lipid Profile No results for input(s): "CHOL", "HDL", "LDLCALC", "TRIG", "CHOLHDL", "LDLDIRECT" in the last 72 hours. Thyroid function studies No results for input(s): "TSH", "T4TOTAL", "T3FREE", "THYROIDAB" in the last 72 hours.  Invalid input(s): "FREET3" Anemia work up No results for input(s): "VITAMINB12", "FOLATE", "FERRITIN", "TIBC", "IRON", "RETICCTPCT" in the last 72 hours. Urinalysis    Component Value Date/Time   COLORURINE YELLOW 10/09/2023 1100   APPEARANCEUR CLEAR 10/09/2023 1100   LABSPEC 1.018 10/09/2023 1100   PHURINE 5.0 10/09/2023 1100   GLUCOSEU NEGATIVE 10/09/2023 1100   GLUCOSEU NEGATIVE 01/30/2022 1342   HGBUR NEGATIVE 10/09/2023 1100   BILIRUBINUR NEGATIVE 10/09/2023 1100   KETONESUR NEGATIVE 10/09/2023 1100   PROTEINUR NEGATIVE 10/09/2023 1100   UROBILINOGEN 0.2 01/30/2022 1342   NITRITE NEGATIVE 10/09/2023 1100   LEUKOCYTESUR SMALL (A) 10/09/2023 1100   Sepsis Labs  Recent Labs  Lab 10/09/23 1100 10/10/23 0609 10/11/23 0645 10/12/23 1824  WBC 15.3* 6.9 9.4 8.6   Microbiology Recent Results (from the past 240 hour(s))  SARS Coronavirus 2 by RT PCR (hospital order, performed in [facility] hospital  lab) *cepheid single result test* Anterior Nasal Swab     Status: None   Collection Time: 10/09/23 11:00 AM   Specimen: Anterior Nasal Swab  Result Value Ref Range Status   SARS Coronavirus 2 by RT PCR NEGATIVE NEGATIVE Final    Comment: (NOTE) SARS-CoV-2 target nucleic acids are NOT DETECTED.  The SARS-CoV-2 RNA is generally detectable in upper and lower respiratory specimens during the acute phase of infection. The lowest concentration of SARS-CoV-2 viral copies this assay can detect is 250 copies / mL. A negative result does not preclude SARS-CoV-2 infection and should not be used as the sole basis for treatment or other patient management decisions.  A negative result may occur with improper specimen collection / handling, submission of specimen other than nasopharyngeal swab, presence of viral mutation(s) within the areas targeted by this assay, and inadequate number of viral copies (<250 copies / mL). A negative result must be combined with clinical observations, patient history, and epidemiological information.  Fact Sheet for Patients:   RoadLapTop.co.za  Fact Sheet for Healthcare Providers: http://kim-miller.com/  This test is not yet approved or  cleared by the [place] FDA and has been authorized for detection and/or diagnosis of SARS-CoV-2 by FDA under an Emergency Use Authorization (EUA).  This EUA will remain in effect (meaning this test can be used) for the duration of the COVID-19 declaration under Section 564(b)(1) of the Act, 21 U.S.C. section 360bbb-3(b)(1), unless the authorization is terminated or revoked sooner.  Performed at [facility], [address], [place], [place] [address]   Urine Culture     Status: None   Collection Time: 10/09/23 11:00 AM   Specimen: Urine, Random  Result Value Ref Range Status   Specimen Description   Final    URINE, RANDOM Performed at [facility], [address] [place], [place] [address]    Special Requests   Final    NONE Reflexed from [id number] Performed at [facility], [address] [place], [place] [address]    Culture   Final    NO GROWTH Performed at [facility], [address], [place], [place] [address]    Report Status 10/11/2023 FINAL  Final    FURTHER DISCHARGE INSTRUCTIONS:   Get Medicines reviewed and adjusted: Please take all your medications with you for your next visit with your Primary MD   Laboratory/radiological data: Please request your Primary MD to go over all hospital tests and procedure/radiological results at the follow up, please ask your Primary MD to get all Hospital records sent to his/her office.   In some cases, they will be blood work, cultures and biopsy results pending at the time of your discharge. Please request that your primary care M.D. goes through all the records of your hospital data and follows up on these results.   Also Note the following: If you experience worsening of your admission symptoms, develop shortness of breath, life threatening emergency, suicidal or homicidal thoughts you must seek medical attention immediately by calling 911 or calling your MD immediately  if symptoms less severe.   You must read complete instructions/literature along with all the possible adverse reactions/side effects for all the Medicines you take  and that have been prescribed to you. Take any new Medicines after you have completely understood and accpet all the possible adverse reactions/side effects.    Do not drive when taking Pain medications or sleeping medications (Benzodaizepines)   Do not take more than prescribed Pain, Sleep and Anxiety Medications. It is not advisable to combine anxiety,sleep and pain medications without talking with your primary care practitioner   Special Instructions: If you have smoked or chewed Tobacco  in the last 2 yrs  please stop smoking, stop any regular Alcohol  and or any Recreational drug use.   Wear Seat belts while driving.   Please note: You were cared for by a hospitalist during your hospital stay. Once you are discharged, your primary care physician will handle any further medical issues. Please note that NO REFILLS for any discharge medications will be authorized once you are discharged, as it is imperative that you return to your primary care physician (or establish a relationship with a primary care physician if you do not have one) for your post hospital discharge needs so that they can reassess your need for medications and monitor your lab values  Time coordinating discharge: Over 30 minutes  SIGNED:   Hughie Closs, MD  [facility] 10/14/2023, 10:17 AM *Please note that this is a verbal dictation therefore any spelling or grammatical errors are due to the "Dragon Medical One" system interpretation. If 7PM-7AM, please contact night-coverage [email or web address]

## 2023-10-14 NOTE — Plan of Care (Signed)
  Problem: Education: Goal: Ability to describe self-care measures that may prevent or decrease complications (Diabetes Survival Skills Education) will improve Outcome: Progressing Goal: Individualized Educational Video(s) Outcome: Progressing   Problem: Coping: Goal: Ability to adjust to condition or change in health will improve Outcome: Progressing   Problem: Fluid Volume: Goal: Ability to maintain a balanced intake and output will improve Outcome: Progressing   Problem: Health Behavior/Discharge Planning: Goal: Ability to identify and utilize available resources and services will improve Outcome: Progressing Goal: Ability to manage health-related needs will improve Outcome: Progressing   Problem: Metabolic: Goal: Ability to maintain appropriate glucose levels will improve Outcome: Progressing   Problem: Nutritional: Goal: Maintenance of adequate nutrition will improve Outcome: Progressing Goal: Progress toward achieving an optimal weight will improve Outcome: Progressing   Problem: Skin Integrity: Goal: Risk for impaired skin integrity will decrease Outcome: Progressing   Problem: Tissue Perfusion: Goal: Adequacy of tissue perfusion will improve Outcome: Progressing   Problem: Education: Goal: Knowledge of General Education information will improve Description: Including pain rating scale, medication(s)/side effects and non-pharmacologic comfort measures Outcome: Progressing   Problem: Health Behavior/Discharge Planning: Goal: Ability to manage health-related needs will improve Outcome: Progressing   Problem: Clinical Measurements: Goal: Ability to maintain clinical measurements within normal limits will improve Outcome: Progressing Goal: Will remain free from infection Outcome: Progressing Goal: Diagnostic test results will improve Outcome: Progressing Goal: Respiratory complications will improve Outcome: Progressing Goal: Cardiovascular complication will  be avoided Outcome: Progressing   Problem: Activity: Goal: Risk for activity intolerance will decrease Outcome: Progressing   Problem: Elimination: Goal: Will not experience complications related to urinary retention Outcome: Progressing   Problem: Pain Managment: Goal: General experience of comfort will improve Outcome: Progressing   Problem: Safety: Goal: Ability to remain free from injury will improve Outcome: Progressing   Problem: Skin Integrity: Goal: Risk for impaired skin integrity will decrease Outcome: Progressing

## 2023-10-14 NOTE — TOC Transition Note (Addendum)
Transition of Care [facility]) - CM/SW Discharge Note   Patient Details  Name: Maria [name] MRN: [id number] Date of Birth: 1956/08/26  Transition of Care [facility] CM/SW Contact:  Beckie Busing, RN Phone Number:[phone]  10/14/2023, 2:44 PM   Clinical Narrative:    Patient with discharge orders. Patient currently is alert and oriented and discharging home with home health today. Patient states that she has to go home because  she can not afford the copay and she feels like she can manage, She has family that will check on her but will not be able to stay. Patient is active with [facility] and will resumes services at discharge. Transportation has been arranged via [facility]. CM attempted to update daughter Claudette Stapler but there is no answer. Voicemail has been left. No other TOC needs noted at this time. TOC will sign off.   1503 CM received return call from daughter. Daughter has been update and made aware of discharge. Daughter does express concern about mother having to pay copay days but states that she understands and that she will speak with her mom about discharge.    Final next level of care: Home w Home Health Services Barriers to Discharge: No Barriers Identified   Patient Goals and CMS Choice CMS Medicare.gov Compare Post Acute Care list provided to:: Patient Choice offered to / list presented to : Adult Children, Patient  Discharge Placement                         Discharge Plan and Services Additional resources added to the After Visit Summary for   In-house Referral: NA Discharge Planning Services: CM Consult Post Acute Care Choice: Skilled Nursing Facility          DME Arranged: N/A DME Agency: NA       HH Arranged: PT, OT HH Agency: Other - See comment Cindie Laroche) Date HH Agency Contacted: 10/14/23 Time HH Agency Contacted: 1444 Representative spoke with at [facility] Agency: Angie  Social Determinants of Health (SDOH) Interventions SDOH Screenings   Food  Insecurity: No Food Insecurity (10/09/2023)  Recent Concern: Food Insecurity - Food Insecurity Present (09/06/2023)  Housing: Low Risk  (10/09/2023)  Transportation Needs: No Transportation Needs (10/09/2023)  Utilities: Not At Risk (10/09/2023)  Alcohol Screen: Low Risk  (12/30/2021)  Depression (PHQ2-9): Medium Risk (01/30/2022)  Financial Resource Strain: Medium Risk (12/30/2021)  Physical Activity: Inactive (12/30/2021)  Social Connections: Unknown (09/17/2022)   [facility]  Stress: No Stress Concern Present (12/30/2021)  Tobacco Use: Medium Risk (10/09/2023)     Readmission Risk Interventions    09/06/2023    1:10 PM 07/22/2022    2:59 PM 04/11/2021   11:40 AM  Readmission Risk Prevention Plan  Transportation Screening Complete Complete Complete  PCP or Specialist Appt within 3-5 Days Complete Complete Complete  HRI or Home Care Consult Complete Complete Complete  Social Work Consult for Recovery Care Planning/Counseling Complete Complete Complete  Palliative Care Screening Not Applicable Complete Not Applicable  Medication Review Oceanographer) Complete Complete Complete

## 2023-10-21 ENCOUNTER — Ambulatory Visit: Payer: 59 | Admitting: Podiatry

## 2023-11-03 ENCOUNTER — Other Ambulatory Visit: Payer: Self-pay | Admitting: Nurse Practitioner

## 2023-11-03 DIAGNOSIS — Z1231 Encounter for screening mammogram for malignant neoplasm of breast: Secondary | ICD-10-CM

## 2023-11-23 ENCOUNTER — Ambulatory Visit: Payer: 59

## 2023-11-25 ENCOUNTER — Inpatient Hospital Stay (HOSPITAL_COMMUNITY)
Admission: EM | Admit: 2023-11-25 | Discharge: 2023-11-30 | DRG: 071 | Disposition: A | Discharge status: Home Health | Payer: 59 | Attending: Internal Medicine | Admitting: Internal Medicine

## 2023-11-25 ENCOUNTER — Emergency Department (HOSPITAL_COMMUNITY): Payer: 59

## 2023-11-25 ENCOUNTER — Other Ambulatory Visit: Payer: Self-pay

## 2023-11-25 DIAGNOSIS — Z79891 Long term (current) use of opiate analgesic: Secondary | ICD-10-CM

## 2023-11-25 DIAGNOSIS — J309 Allergic rhinitis, unspecified: Secondary | ICD-10-CM | POA: Diagnosis present

## 2023-11-25 DIAGNOSIS — I130 Hypertensive heart and chronic kidney disease with heart failure and stage 1 through stage 4 chronic kidney disease, or unspecified chronic kidney disease: Secondary | ICD-10-CM | POA: Diagnosis present

## 2023-11-25 DIAGNOSIS — Z91040 Latex allergy status: Secondary | ICD-10-CM

## 2023-11-25 DIAGNOSIS — E785 Hyperlipidemia, unspecified: Secondary | ICD-10-CM | POA: Diagnosis present

## 2023-11-25 DIAGNOSIS — Z6841 Body Mass Index (BMI) 40.0 and over, adult: Secondary | ICD-10-CM

## 2023-11-25 DIAGNOSIS — M545 Other chronic pain: Secondary | ICD-10-CM | POA: Diagnosis present

## 2023-11-25 DIAGNOSIS — Z90710 Acquired absence of both cervix and uterus: Secondary | ICD-10-CM

## 2023-11-25 DIAGNOSIS — Z90721 Acquired absence of ovaries, unilateral: Secondary | ICD-10-CM

## 2023-11-25 DIAGNOSIS — T428X5A Adverse effect of antiparkinsonism drugs and other central muscle-tone depressants, initial encounter: Secondary | ICD-10-CM | POA: Diagnosis present

## 2023-11-25 DIAGNOSIS — R748 Abnormal levels of other serum enzymes: Secondary | ICD-10-CM

## 2023-11-25 DIAGNOSIS — F32A Depression, unspecified: Secondary | ICD-10-CM | POA: Diagnosis present

## 2023-11-25 DIAGNOSIS — R443 Hallucinations, unspecified: Secondary | ICD-10-CM | POA: Diagnosis present

## 2023-11-25 DIAGNOSIS — L8990 Pressure ulcer of unspecified site, unspecified stage: Secondary | ICD-10-CM | POA: Insufficient documentation

## 2023-11-25 DIAGNOSIS — N3001 Acute cystitis with hematuria: Secondary | ICD-10-CM | POA: Diagnosis present

## 2023-11-25 DIAGNOSIS — G8929 Other chronic pain: Secondary | ICD-10-CM | POA: Diagnosis present

## 2023-11-25 DIAGNOSIS — Z8249 Family history of ischemic heart disease and other diseases of the circulatory system: Secondary | ICD-10-CM

## 2023-11-25 DIAGNOSIS — T428X1A Poisoning by antiparkinsonism drugs and other central muscle-tone depressants, accidental (unintentional), initial encounter: Principal | ICD-10-CM | POA: Diagnosis present

## 2023-11-25 DIAGNOSIS — F0393 Unspecified dementia, unspecified severity, with mood disturbance: Secondary | ICD-10-CM | POA: Diagnosis present

## 2023-11-25 DIAGNOSIS — G2581 Restless legs syndrome: Secondary | ICD-10-CM | POA: Diagnosis present

## 2023-11-25 DIAGNOSIS — Z885 Allergy status to narcotic agent status: Secondary | ICD-10-CM

## 2023-11-25 DIAGNOSIS — R410 Disorientation, unspecified: Principal | ICD-10-CM

## 2023-11-25 DIAGNOSIS — Z7985 Long-term (current) use of injectable non-insulin antidiabetic drugs: Secondary | ICD-10-CM

## 2023-11-25 DIAGNOSIS — R5381 Other malaise: Secondary | ICD-10-CM | POA: Diagnosis present

## 2023-11-25 DIAGNOSIS — Z91148 Patient's other noncompliance with medication regimen for other reason: Secondary | ICD-10-CM

## 2023-11-25 DIAGNOSIS — I5032 Chronic diastolic (congestive) heart failure: Secondary | ICD-10-CM | POA: Diagnosis present

## 2023-11-25 DIAGNOSIS — G9341 Metabolic encephalopathy: Secondary | ICD-10-CM | POA: Diagnosis not present

## 2023-11-25 DIAGNOSIS — Z8619 Personal history of other infectious and parasitic diseases: Secondary | ICD-10-CM

## 2023-11-25 DIAGNOSIS — E1151 Type 2 diabetes mellitus with diabetic peripheral angiopathy without gangrene: Secondary | ICD-10-CM | POA: Diagnosis present

## 2023-11-25 DIAGNOSIS — W1830XA Fall on same level, unspecified, initial encounter: Secondary | ICD-10-CM | POA: Diagnosis present

## 2023-11-25 DIAGNOSIS — Z79899 Other long term (current) drug therapy: Secondary | ICD-10-CM

## 2023-11-25 DIAGNOSIS — Z881 Allergy status to other antibiotic agents status: Secondary | ICD-10-CM

## 2023-11-25 DIAGNOSIS — Y92009 Unspecified place in unspecified non-institutional (private) residence as the place of occurrence of the external cause: Secondary | ICD-10-CM

## 2023-11-25 DIAGNOSIS — X58XXXA Exposure to other specified factors, initial encounter: Secondary | ICD-10-CM | POA: Diagnosis present

## 2023-11-25 DIAGNOSIS — N390 Urinary tract infection, site not specified: Secondary | ICD-10-CM | POA: Diagnosis present

## 2023-11-25 DIAGNOSIS — E1122 Type 2 diabetes mellitus with diabetic chronic kidney disease: Secondary | ICD-10-CM | POA: Diagnosis present

## 2023-11-25 DIAGNOSIS — G894 Chronic pain syndrome: Secondary | ICD-10-CM | POA: Diagnosis present

## 2023-11-25 DIAGNOSIS — Z818 Family history of other mental and behavioral disorders: Secondary | ICD-10-CM

## 2023-11-25 DIAGNOSIS — Z9079 Acquired absence of other genital organ(s): Secondary | ICD-10-CM

## 2023-11-25 DIAGNOSIS — I10 Essential (primary) hypertension: Secondary | ICD-10-CM | POA: Diagnosis present

## 2023-11-25 DIAGNOSIS — Z8614 Personal history of Methicillin resistant Staphylococcus aureus infection: Secondary | ICD-10-CM

## 2023-11-25 DIAGNOSIS — R296 Repeated falls: Secondary | ICD-10-CM | POA: Diagnosis present

## 2023-11-25 DIAGNOSIS — D631 Anemia in chronic kidney disease: Secondary | ICD-10-CM | POA: Diagnosis present

## 2023-11-25 DIAGNOSIS — I872 Venous insufficiency (chronic) (peripheral): Secondary | ICD-10-CM | POA: Diagnosis present

## 2023-11-25 DIAGNOSIS — Z91048 Other nonmedicinal substance allergy status: Secondary | ICD-10-CM

## 2023-11-25 DIAGNOSIS — Z888 Allergy status to other drugs, medicaments and biological substances status: Secondary | ICD-10-CM

## 2023-11-25 DIAGNOSIS — E119 Type 2 diabetes mellitus without complications: Secondary | ICD-10-CM | POA: Diagnosis present

## 2023-11-25 DIAGNOSIS — F411 Generalized anxiety disorder: Secondary | ICD-10-CM | POA: Diagnosis present

## 2023-11-25 DIAGNOSIS — I890 Lymphedema, not elsewhere classified: Secondary | ICD-10-CM

## 2023-11-25 DIAGNOSIS — Z87891 Personal history of nicotine dependence: Secondary | ICD-10-CM

## 2023-11-25 DIAGNOSIS — Z833 Family history of diabetes mellitus: Secondary | ICD-10-CM

## 2023-11-25 DIAGNOSIS — K219 Gastro-esophageal reflux disease without esophagitis: Secondary | ICD-10-CM | POA: Diagnosis present

## 2023-11-25 DIAGNOSIS — G4733 Obstructive sleep apnea (adult) (pediatric): Secondary | ICD-10-CM | POA: Diagnosis present

## 2023-11-25 DIAGNOSIS — N1831 Chronic kidney disease, stage 3a: Secondary | ICD-10-CM | POA: Diagnosis present

## 2023-11-25 LAB — I-STAT CG4 LACTIC ACID, ED: Lactic Acid, Venous: 1.7 mmol/L (ref 0.5–1.9)

## 2023-11-25 LAB — COMPREHENSIVE METABOLIC PANEL
ALT: 21 U/L (ref 0–44)
AST: 24 U/L (ref 15–41)
Albumin: 3.5 g/dL (ref 3.5–5.0)
Alkaline Phosphatase: 80 U/L (ref 38–126)
Anion gap: 9 (ref 5–15)
BUN: 16 mg/dL (ref 8–23)
CO2: 26 mmol/L (ref 22–32)
Calcium: 8.8 mg/dL — ABNORMAL LOW (ref 8.9–10.3)
Chloride: 104 mmol/L (ref 98–111)
Creatinine, Ser: 1.06 mg/dL — ABNORMAL HIGH (ref 0.44–1.00)
GFR, Estimated: 58 mL/min — ABNORMAL LOW (ref >60)
Glucose, Bld: 94 mg/dL (ref 70–99)
Potassium: 3.8 mmol/L (ref 3.5–5.1)
Sodium: 139 mmol/L (ref 135–145)
Total Bilirubin: 2.4 mg/dL — ABNORMAL HIGH (ref <1.2)
Total Protein: 6.8 g/dL (ref 6.5–8.1)

## 2023-11-25 LAB — URINALYSIS, W/ REFLEX TO CULTURE (INFECTION SUSPECTED)
Bilirubin Urine: NEGATIVE
Glucose, UA: NEGATIVE mg/dL
Ketones, ur: 5 mg/dL — AB
Nitrite: NEGATIVE
Protein, ur: NEGATIVE mg/dL
Specific Gravity, Urine: 1.015 (ref 1.005–1.030)
pH: 5 (ref 5.0–8.0)

## 2023-11-25 LAB — GLUCOSE, CAPILLARY: Glucose-Capillary: 62 mg/dL — ABNORMAL LOW (ref 70–99)

## 2023-11-25 LAB — CBC WITH DIFFERENTIAL/PLATELET
Abs Immature Granulocytes: 0.05 10*3/uL (ref 0.00–0.07)
Basophils Absolute: 0.1 10*3/uL (ref 0.0–0.1)
Basophils Relative: 1 %
Eosinophils Absolute: 0.2 10*3/uL (ref 0.0–0.5)
Eosinophils Relative: 1 %
HCT: 36.6 % (ref 36.0–46.0)
Hemoglobin: 11.5 g/dL — ABNORMAL LOW (ref 12.0–15.0)
Immature Granulocytes: 0 %
Lymphocytes Relative: 22 %
Lymphs Abs: 2.6 10*3/uL (ref 0.7–4.0)
MCH: 29.3 pg (ref 26.0–34.0)
MCHC: 31.4 g/dL (ref 30.0–36.0)
MCV: 93.4 fL (ref 80.0–100.0)
Monocytes Absolute: 0.9 10*3/uL (ref 0.1–1.0)
Monocytes Relative: 8 %
Neutro Abs: 8 10*3/uL — ABNORMAL HIGH (ref 1.7–7.7)
Neutrophils Relative %: 68 %
Platelets: 320 10*3/uL (ref 150–400)
RBC: 3.92 MIL/uL (ref 3.87–5.11)
RDW: 13.5 % (ref 11.5–15.5)
WBC: 11.8 10*3/uL — ABNORMAL HIGH (ref 4.0–10.5)
nRBC: 0 % (ref 0.0–0.2)

## 2023-11-25 LAB — ETHANOL: Alcohol, Ethyl (B): <10 mg/dL (ref <10)

## 2023-11-25 LAB — CK: Total CK: 322 U/L — ABNORMAL HIGH (ref 38–234)

## 2023-11-25 MED ORDER — SPIRONOLACTONE 12.5 MG HALF TABLET
12.5000 mg | ORAL_TABLET | Freq: Every day | ORAL | Status: DC
  Administered 2023-11-26 – 2023-11-30 (×5): 12.5 mg via ORAL
  Filled 2023-11-25 (×5): qty 1

## 2023-11-25 MED ORDER — CLONAZEPAM 1 MG PO TABS
1.0000 mg | ORAL_TABLET | Freq: Two times a day (BID) | ORAL | Status: DC | PRN
  Administered 2023-11-26 – 2023-11-29 (×5): 1 mg via ORAL
  Filled 2023-11-25 (×5): qty 1

## 2023-11-25 MED ORDER — SODIUM CHLORIDE 0.9 % IV SOLN: 1.0000 g | INTRAVENOUS | Status: DC

## 2023-11-25 MED ORDER — INSULIN ASPART 100 UNIT/ML IJ SOLN
0.0000 [IU] | Freq: Three times a day (TID) | INTRAMUSCULAR | Status: DC
  Administered 2023-11-27 – 2023-11-29 (×3): 1 [IU] via SUBCUTANEOUS
  Filled 2023-11-25: qty 0.09

## 2023-11-25 MED ORDER — ENOXAPARIN SODIUM 80 MG/0.8ML IJ SOSY
75.0000 mg | PREFILLED_SYRINGE | INTRAMUSCULAR | Status: DC
  Administered 2023-11-26 – 2023-11-27 (×2): 75 mg via SUBCUTANEOUS
  Administered 2023-11-28: 80 mg via SUBCUTANEOUS
  Filled 2023-11-25 (×3): qty 0.8

## 2023-11-25 MED ORDER — ONDANSETRON HCL 4 MG PO TABS: 4.0000 mg | ORAL_TABLET | Freq: Four times a day (QID) | ORAL | Status: DC | PRN

## 2023-11-25 MED ORDER — LACTATED RINGERS IV BOLUS
1000.0000 mL | Freq: Once | INTRAVENOUS | Status: AC
  Administered 2023-11-25: 1000 mL via INTRAVENOUS

## 2023-11-25 MED ORDER — ACETAMINOPHEN 325 MG PO TABS
650.0000 mg | ORAL_TABLET | Freq: Four times a day (QID) | ORAL | Status: DC | PRN
  Filled 2023-11-25: qty 2

## 2023-11-25 MED ORDER — PRAMIPEXOLE DIHYDROCHLORIDE 0.25 MG PO TABS
0.5000 mg | ORAL_TABLET | Freq: Three times a day (TID) | ORAL | Status: DC
  Administered 2023-11-26 (×3): 0.5 mg via ORAL
  Filled 2023-11-25 (×4): qty 2

## 2023-11-25 MED ORDER — BISACODYL 5 MG PO TBEC: 5.0000 mg | DELAYED_RELEASE_TABLET | Freq: Every day | ORAL | Status: DC | PRN

## 2023-11-25 MED ORDER — METOPROLOL SUCCINATE ER 25 MG PO TB24
25.0000 mg | ORAL_TABLET | Freq: Every day | ORAL | Status: DC
  Administered 2023-11-26 – 2023-11-30 (×5): 25 mg via ORAL
  Filled 2023-11-25 (×5): qty 1

## 2023-11-25 MED ORDER — SODIUM CHLORIDE 0.9 % IV SOLN
1.0000 g | Freq: Once | INTRAVENOUS | Status: AC
  Administered 2023-11-25: 1 g via INTRAVENOUS
  Filled 2023-11-25: qty 10

## 2023-11-25 MED ORDER — PANTOPRAZOLE SODIUM 40 MG PO TBEC
40.0000 mg | DELAYED_RELEASE_TABLET | Freq: Two times a day (BID) | ORAL | Status: DC
  Administered 2023-11-25 – 2023-11-30 (×10): 40 mg via ORAL
  Filled 2023-11-25 (×10): qty 1

## 2023-11-25 MED ORDER — TORSEMIDE 20 MG PO TABS
60.0000 mg | ORAL_TABLET | Freq: Two times a day (BID) | ORAL | Status: DC
  Administered 2023-11-26 – 2023-11-30 (×9): 60 mg via ORAL
  Filled 2023-11-25 (×10): qty 3

## 2023-11-25 MED ORDER — ENOXAPARIN SODIUM 80 MG/0.8ML IJ SOSY: 70.0000 mg | PREFILLED_SYRINGE | INTRAMUSCULAR | Status: DC

## 2023-11-25 MED ORDER — SENNOSIDES-DOCUSATE SODIUM 8.6-50 MG PO TABS: 1.0000 | ORAL_TABLET | Freq: Every evening | ORAL | Status: DC | PRN

## 2023-11-25 MED ORDER — ONDANSETRON HCL 4 MG/2ML IJ SOLN: 4.0000 mg | Freq: Four times a day (QID) | INTRAMUSCULAR | Status: DC | PRN

## 2023-11-25 MED ORDER — ACETAMINOPHEN 650 MG RE SUPP: 650.0000 mg | Freq: Four times a day (QID) | RECTAL | Status: DC | PRN

## 2023-11-25 MED ORDER — SODIUM CHLORIDE 0.9 % IV SOLN: 2.0000 g | INTRAVENOUS | Status: DC

## 2023-11-25 MED ORDER — OXYCODONE HCL 5 MG PO TABS
10.0000 mg | ORAL_TABLET | Freq: Two times a day (BID) | ORAL | Status: DC
  Administered 2023-11-25 – 2023-11-28 (×6): 10 mg via ORAL
  Filled 2023-11-25 (×6): qty 2
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
## 2023-11-26 ENCOUNTER — Encounter (HOSPITAL_COMMUNITY): Payer: Self-pay | Admitting: Internal Medicine

## 2023-11-26 DIAGNOSIS — N3000 Acute cystitis without hematuria: Secondary | ICD-10-CM

## 2023-11-26 DIAGNOSIS — L8990 Pressure ulcer of unspecified site, unspecified stage: Secondary | ICD-10-CM | POA: Insufficient documentation

## 2023-11-26 DIAGNOSIS — R443 Hallucinations, unspecified: Secondary | ICD-10-CM | POA: Diagnosis not present

## 2023-11-26 DIAGNOSIS — F418 Other specified anxiety disorders: Secondary | ICD-10-CM | POA: Diagnosis not present

## 2023-11-26 DIAGNOSIS — R5381 Other malaise: Secondary | ICD-10-CM | POA: Diagnosis not present

## 2023-11-26 DIAGNOSIS — G9341 Metabolic encephalopathy: Secondary | ICD-10-CM | POA: Diagnosis not present

## 2023-11-26 LAB — BASIC METABOLIC PANEL
Anion gap: 9 (ref 5–15)
BUN: 14 mg/dL (ref 8–23)
CO2: 24 mmol/L (ref 22–32)
Calcium: 8.5 mg/dL — ABNORMAL LOW (ref 8.9–10.3)
Chloride: 106 mmol/L (ref 98–111)
Creatinine, Ser: 1.01 mg/dL — ABNORMAL HIGH (ref 0.44–1.00)
GFR, Estimated: >60 mL/min (ref >60)
Glucose, Bld: 84 mg/dL (ref 70–99)
Potassium: 3.6 mmol/L (ref 3.5–5.1)
Sodium: 139 mmol/L (ref 135–145)

## 2023-11-26 LAB — GLUCOSE, CAPILLARY
Glucose-Capillary: 107 mg/dL — ABNORMAL HIGH (ref 70–99)
Glucose-Capillary: 114 mg/dL — ABNORMAL HIGH (ref 70–99)
Glucose-Capillary: 77 mg/dL (ref 70–99)
Glucose-Capillary: 81 mg/dL (ref 70–99)
Glucose-Capillary: 87 mg/dL (ref 70–99)
Glucose-Capillary: 92 mg/dL (ref 70–99)

## 2023-11-26 LAB — BLOOD CULTURE ID PANEL (REFLEXED) - BCID2

## 2023-11-26 LAB — CBC
HCT: 34.2 % — ABNORMAL LOW (ref 36.0–46.0)
Hemoglobin: 10.7 g/dL — ABNORMAL LOW (ref 12.0–15.0)
MCH: 29.7 pg (ref 26.0–34.0)
MCHC: 31.3 g/dL (ref 30.0–36.0)
MCV: 95 fL (ref 80.0–100.0)
Platelets: 278 10*3/uL (ref 150–400)
RBC: 3.6 MIL/uL — ABNORMAL LOW (ref 3.87–5.11)
RDW: 13.4 % (ref 11.5–15.5)
WBC: 9.6 10*3/uL (ref 4.0–10.5)
nRBC: 0 % (ref 0.0–0.2)

## 2023-11-26 LAB — HIV ANTIBODY (ROUTINE TESTING W REFLEX): HIV Screen 4th Generation wRfx: REACTIVE — AB

## 2023-11-26 LAB — URINE CULTURE: Culture: <10000 — AB

## 2023-11-26 LAB — CK: Total CK: 231 U/L (ref 38–234)

## 2023-11-26 LAB — CG4 I-STAT (LACTIC ACID): Lactic Acid, Venous: 1.1 mmol/L (ref 0.5–1.9)

## 2023-11-26 MED ORDER — GERHARDT'S BUTT CREAM
TOPICAL_CREAM | Freq: Two times a day (BID) | CUTANEOUS | Status: DC
  Administered 2023-11-27: 1 via TOPICAL
  Filled 2023-11-26 (×2): qty 60

## 2023-11-26 MED ORDER — PHENOL 1.4 % MT LIQD
1.0000 | OROMUCOSAL | Status: DC | PRN
  Administered 2023-11-26: 1 via OROMUCOSAL
  Filled 2023-11-26: qty 177

## 2023-11-26 MED ORDER — SODIUM CHLORIDE 0.9 % IV SOLN
2.0000 g | INTRAVENOUS | Status: DC
  Administered 2023-11-26 – 2023-11-28 (×3): 2 g via INTRAVENOUS
  Filled 2023-11-26 (×3): qty 20

## 2023-11-26 MED ORDER — NYSTATIN 100000 UNIT/GM EX CREA
TOPICAL_CREAM | Freq: Two times a day (BID) | CUTANEOUS | Status: DC
  Administered 2023-11-27: 1 via TOPICAL
  Filled 2023-11-26: qty 30

## 2023-11-26 MED ORDER — PRAMIPEXOLE DIHYDROCHLORIDE 0.25 MG PO TABS
0.5000 mg | ORAL_TABLET | Freq: Two times a day (BID) | ORAL | Status: AC
  Administered 2023-11-27 – 2023-11-28 (×3): 0.5 mg via ORAL
  Filled 2023-11-26 (×3): qty 2

## 2023-11-26 MED ORDER — DULOXETINE HCL 20 MG PO CPEP
20.0000 mg | ORAL_CAPSULE | Freq: Every day | ORAL | Status: DC
  Administered 2023-11-26 – 2023-11-29 (×4): 20 mg via ORAL
  Filled 2023-11-26 (×4): qty 1

## 2023-11-26 MED ORDER — VITAMIN B-12 1000 MCG PO TABS: 1000.0000 ug | ORAL_TABLET | Freq: Every day | ORAL | Status: DC

## 2023-11-26 MED ORDER — VITAMIN B-12 1000 MCG PO TABS
1000.0000 ug | ORAL_TABLET | Freq: Every day | ORAL | Status: DC
Start: 2023-11-26 — End: 2023-11-30
  Administered 2023-11-26 – 2023-11-30 (×5): 1000 ug via ORAL
  Filled 2023-11-26 (×5): qty 1

## 2023-11-26 NOTE — Assessment & Plan Note (Signed)
-   Chronically on Klonopin - Verified on database

## 2023-11-26 NOTE — Assessment & Plan Note (Signed)
Continue PPI ?

## 2023-11-26 NOTE — Assessment & Plan Note (Signed)
-   Previously recommended for SNF by PT/OT but patient has been unable to afford -She is also likely in need of long-term care at this rate.  She continues to reconsider this each hospitalization -For now, we will again plan on discharging home with home health after discussions with patient

## 2023-11-26 NOTE — Assessment & Plan Note (Signed)
-   recurrent issue as with prior hospitalizations; again treating potential infectious etiology but UA again not too impressive from infection standpoint, plus she has continued to have hallucinations when in her normal state of health - will ask for repeat psychiatry evaluation in case of need for further workup; greatly appreciate. We will plan on getting patient outpt neuro referral as well - her pramipexole has been tapered off per psychiatry - Treating UA with short course rocephin but she has known hx of ESBL Kleb  -She was fully alert and oriented at time of discharge

## 2023-11-26 NOTE — Assessment & Plan Note (Signed)
Continue Cymbalta.

## 2023-11-26 NOTE — Assessment & Plan Note (Signed)
-   Complicates overall prognosis and care - Body mass index is 59.96 kg/m.

## 2023-11-26 NOTE — TOC Progression Note (Signed)
Transition of Care [facility]) - Progression Note    Patient Details  Name: Maria [name] MRN: [id number] Date of Birth: Feb 25, 1956  Transition of [facility]) CM/SW Contact  Otelia Santee, LCSW Phone Number: 11/26/2023, 1:40 PM  Clinical Narrative:    Left VM with daughter to discuss disposition. Awaiting return call.        Expected Discharge Plan and Services                                               Social Determinants of Health (SDOH) Interventions SDOH Screenings   Food Insecurity: No Food Insecurity (11/25/2023)  Recent Concern: Food Insecurity - Food Insecurity Present (09/06/2023)  Housing: Low Risk  (11/25/2023)  Transportation Needs: Unmet Transportation Needs (11/25/2023)  Utilities: Not At Risk (11/25/2023)  Alcohol Screen: Low Risk  (12/30/2021)  Depression (PHQ2-9): Medium Risk (01/30/2022)  Financial Resource Strain: Medium Risk (12/30/2021)  Physical Activity: Inactive (12/30/2021)  Social Connections: Unknown (09/17/2022)   [facility] from [facility]  Stress: No Stress Concern Present (12/30/2021)  Tobacco Use: Medium Risk (11/26/2023)    Readmission Risk Interventions    09/06/2023    1:10 PM 07/22/2022    2:59 PM 04/11/2021   11:40 AM  Readmission Risk Prevention Plan  Transportation Screening Complete Complete Complete  PCP or Specialist Appt within 3-5 Days Complete Complete Complete  HRI or Home Care Consult Complete Complete Complete  Social Work Consult for Recovery Care Planning/Counseling Complete Complete Complete  Palliative Care Screening Not Applicable Complete Not Applicable  Medication Review Oceanographer) Complete Complete Complete

## 2023-11-26 NOTE — Consult Note (Signed)
[name] Consult Evaluation  Service Date: November 26, 2023 LOS:  LOS: 0 days    Primary Psychiatric Diagnoses  Depression 2.  Anxiety   Assessment  HPI  Maria [name] is a 67 y.o. female admitted medically for 11/25/2023  5:18 PM for AMS. She carries the psychiatric diagnoses of Depression and anxiety and has a past medical history of  DM2, CHS HTN, OSA . Psychiatry was consulted for visual hallucination.  Patient had similar presentation in the past when she has visual hallucination, likely in the context of UTI. It appears that the [facility] improved after UTI was treated, and she is back again with similar c/o VH.   Psych was consulted last hospitalization which ruled out any primary psychiatric process.   CT Head done this visit is WNL.   Met the patient at the bed side. Tells me that she stated having VH since September. She doesn't remember anything acute at that time except for UTI. She is not sure if the [facility] improved after UTI was treated. She has not seen neurology or ophthalmology since last hospitalization.  Regarding her VH, she sees "a small person" "sometimes a face" "sometimes tall skinny stuff". Denied AH/TH.   She does have some issues with memory; lives by herself. Uses her phone for reminders/alerts. Her neighbor across the hall sometimes gives her food, but the patient eats mostly lean cuisine. She doesn't have the energy and has chronic pain to cook or to stand for longer periods.    She does report some mild resting tremor. Denied other physical symptoms of PD  I discussed possible reasons behind VH which include delirium (likely due to unknown infection/UTI); medications (?pramipexole) and other neurological reasons.   She agreed to taper down Mirapex over the weekend. She agreed to follow up with neurology as outpatient.  She also understands that her chronic use of BZD is also likely impacting her memory.  Assessement  The patient presented with AMS and  VH; similar presentation to 3 months ago.  Recommend treating any underlying infection (?UTI) could itself help with resolving the VH due to delirium.  Given that Mirapex is pro-dopaminergic, it could be worthwhile holding the medication to see any improvement in [facility].  Additionally, she may need to be ruled out for any organic neurological causes; particularly Lewy Body Dementia where it could present with [facility] and memory issues before physical symptoms. Of note, she does report slight cognitive decline as well as a resting tremor.    Diagnoses:  Active Hospital problems: Principal Problem:   Acute metabolic encephalopathy Active Problems:   Anxiety state   Depression   HTN (hypertension)   Gastroesophageal reflux disease without esophagitis   Chronic low back pain   OSA (obstructive sleep apnea)   Type 2 diabetes mellitus with stage 3a chronic kidney disease, without long-term current use of insulin (HCC)   Chronic diastolic CHF (congestive heart failure) (HCC)   Urinary tract infection   Physical deconditioning     Plan   ## Psychiatric Medication Recommendations:  -- Taper down Mirapex to 0.5 mg BID today; 0.5 mg OD tomorrow and stop -- Continue Duloxetine -- Continue Klonopin -- Recommend outpatient neurology follow up   ## Medical Decision Making Capacity:  - Intact  ## Further Work-up:  -- None at this time     ## Disposition:  -- There are no psychiatric contraindications to discharge at this time  ## Behavioral / Environmental:  --  Delirium precaution    ##  Safety and Observation Level:  - Based on my clinical evaluation, I estimate the patient to be at low risk of self harm in the current setting - This decision is based on my review of the chart including patient's history and current presentation, interview of the patient, mental status examination, and consideration of suicide risk including evaluating suicidal ideation, plan, intent, suicidal or self-harm  behaviors, risk factors, and protective factors. This judgment is based on our ability to directly address suicide risk, implement suicide prevention strategies and develop a safety plan while the patient is in the clinical setting. Please contact our team if there is a concern that risk level has changed.  Suicide risk assessment   Patient has following non-modifiable or demographic risk factors for suicide: none  Patient has the following protective factors against suicide: no SI/SIB   Thank you for this consult request. Recommendations have been communicated to the primary team.  We will sign off at this time.   Stark Klein, MD  Psychiatric and Social History    Psych ROS:  Depression: denied Anxiety:  chronic baseline anxiety Mania (lifetime and current): denied Psychosis: (lifetime and current): denied    Prior ECT: none Prior Psych Hospitalization: none  Prior Self Harm: none Prior Violence: none  Family Hx suicide: denied  Social History:  Lives alone; has neighbors who help her Access to weapons: denied  Substance History Tobacco use: denied Alcohol use: denied Drug use: denied   Exam Findings   Psychiatric Specialty Exam:  Presentation  General Appearance: Appropriate for Environment; Casual  Eye Contact:Good  Speech:Clear and Coherent; Normal Rate  Speech Volume:Normal  Handedness:Right   Mood and Affect  Mood:Euthymic  Affect:Appropriate; Congruent   Thought Process  Thought Processes:Coherent; Linear  Descriptions of Associations:Intact  Orientation:Full (Time, Place and Person)  Thought Content:Logical  Hallucinations:No data recorded Ideas of Reference:None  Suicidal Thoughts:No data recorded Homicidal Thoughts:No data recorded  Sensorium  Memory:Immediate Fair; Recent Good; Remote Good  Judgment:Fair  Insight:Fair   Executive Functions  Concentration:Good  Attention Span:Good  Recall:Good  Fund of  Knowledge:Good  Language:Good   Psychomotor Activity  Psychomotor Activity:No data recorded  Assets  Assets:Communication Skills; Desire for Improvement; Housing; Health and safety inspector; Social Support; Other (comment) (spiritual)   Sleep  Sleep:No data recorded   Physical Exam: Vital signs:  Temp:  [98 F (36.7 C)-98.8 F (37.1 C)] 98.8 F (37.1 C) (12/06 1233) Pulse Rate:  [86-101] 86 (12/06 1233) Resp:  [16-23] 17 (12/06 1233) BP: (113-157)/(53-133) 113/59 (12/06 1233) SpO2:  [94 %-100 %] 99 % (12/06 1233) Weight:  [153.5 kg] 153.5 kg (12/05 1739) Physical Exam  Blood pressure (!) 113/59, pulse 86, temperature 98.8 F (37.1 C), resp. rate 17, height 5\' 3"  (1.6 m), weight (!) 153.5 kg, SpO2 99%. Body mass index is 59.96 kg/m.   Other History   These have been pulled in through the EMR, reviewed, and updated if appropriate.   Family History:   The patient's family history includes Anxiety disorder in her sister; Asthma in her daughter and sister; Bipolar disorder in her daughter; Cancer in her maternal uncle and another family member; Depression in her sister; Diabetes in her mother; Heart disease in her mother; Heart failure in her mother; Hypertension in her father and mother; Liver cancer in her paternal grandmother.  Medical History: Past Medical History:  Diagnosis Date   Acute lymphadenitis 2011   ALLERGIC RHINITIS 08/10/2007   ANXIETY 12/06/2007   Atherosclerotic peripheral vascular disease (HCC) 06/13/2013  Aorta on CT June 2014   Cellulitis 2011   Cervical disc disease 03/09/2012   CHF (congestive heart failure) (HCC)    Chronic pain 03/09/2012   DEPRESSION 12/06/2007   Diabetes (HCC)    DIVERTICULOSIS, [place] 12/06/2007   GERD 12/06/2007   Hepatitis    age 61 hepatitis A   HLD (hyperlipidemia) 05/24/2019   HYPERTENSION 12/06/2007   Lumbar disc disease 03/09/2012   Mesenteric adenitis    MRSA 2006   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 2010   CERVICAL DISC SURGERY  may 24th 2010   COLONOSCOPY WITH PROPOFOL N/A 07/10/2016   Procedure: COLONOSCOPY WITH PROPOFOL;  Surgeon: Ruffin Frederick, MD;  Location: [facility];  Service: Gastroenterology;  Laterality: N/A;   COLONOSCOPY WITH PROPOFOL N/A 09/26/2019   Procedure: COLONOSCOPY WITH PROPOFOL;  Surgeon: Benancio Deeds, MD;  Location: [facility];  Service: Gastroenterology;  Laterality: N/A;   ESOPHAGOGASTRODUODENOSCOPY (EGD) WITH PROPOFOL N/A 07/10/2016   Procedure: ESOPHAGOGASTRODUODENOSCOPY (EGD) WITH PROPOFOL;  Surgeon: Ruffin Frederick, MD;  Location: [facility];  Service: Gastroenterology;  Laterality: N/A;   POLYPECTOMY  09/26/2019   Procedure: POLYPECTOMY;  Surgeon: Benancio Deeds, MD;  Location: [facility];  Service: Gastroenterology;;   RADIOLOGY WITH ANESTHESIA N/A 07/30/2023   Procedure: MRI WITH ANESTHESIA - THORACIC LUMBAR WITHOUT CONSTRAST;  Surgeon: Radiologist, Medication, MD;  Location: [facility];  Service: Radiology;  Laterality: N/A;   s/p ovary cyst     s/p right knee arthroscopy     Dr. Dion Saucier ortho    Medications:   Current Facility-Administered Medications:    acetaminophen (TYLENOL) tablet 650 mg, 650 mg, Oral, Q6H PRN **OR** acetaminophen (TYLENOL) suppository 650 mg, 650 mg, Rectal, Q6H PRN, Allena Katz, Floreen Comber, MD   bisacodyl (DULCOLAX) EC tablet 5 mg, 5 mg, Oral, Daily PRN, Darreld Mclean R, MD   cefTRIAXone (ROCEPHIN) 2 g in sodium chloride 0.9 % 100 mL IVPB, 2 g, Intravenous, Q24H, Lewie Chamber, MD, Last Rate: 200 mL/hr at 11/26/23 1100, 2 g at 11/26/23 1100   clonazePAM (KLONOPIN) tablet 1 mg, 1 mg, Oral, BID PRN, Darreld Mclean R, MD, 1 mg at 11/26/23 1100   enoxaparin (LOVENOX) injection 75 mg, 75  mg, Subcutaneous, Q24H, Allena Katz, Vishal R, MD, 75 mg at 11/26/23 1100   Gerhardt's butt cream, , Topical, BID, Lewie Chamber, MD, Given at 11/26/23 1100   insulin aspart (novoLOG) injection 0-9 Units, 0-9 Units, Subcutaneous, TID WC, Patel, Vishal R, MD   metoprolol succinate (TOPROL-XL) 24 hr tablet 25 mg, 25 mg, Oral, Daily, Allena Katz, Vishal R, MD, 25 mg at 11/26/23 1100   nystatin cream (MYCOSTATIN), , Topical, BID, Lewie Chamber, MD, Given at 11/26/23 1100   ondansetron (ZOFRAN) tablet 4 mg, 4 mg, Oral, Q6H PRN **OR** ondansetron (ZOFRAN) injection 4 mg, 4 mg, Intravenous, Q6H PRN, Allena Katz, Vishal R, MD   oxyCODONE (Oxy IR/ROXICODONE) immediate release tablet 10 mg, 10 mg, Oral, BID, Allena Katz, Vishal R, MD, 10 mg at 11/26/23 1100   pantoprazole (PROTONIX) EC tablet 40 mg, 40 mg, Oral, BID, Allena Katz, Vishal R, MD, 40 mg at 11/26/23 1100   phenol (CHLORASEPTIC) mouth spray 1 spray, 1 spray, Mouth/Throat, PRN, Charlsie Quest, MD, 1 spray at 11/26/23 0307   pramipexole (MIRAPEX) tablet  0.5 mg, 0.5 mg, Oral, TID, Darreld Mclean R, MD, 0.5 mg at 11/26/23 1100   senna-docusate (Senokot-S) tablet 1 tablet, 1 tablet, Oral, QHS PRN, Darreld Mclean R, MD   spironolactone (ALDACTONE) tablet 12.5 mg, 12.5 mg, Oral, Daily, Allena Katz, Vishal R, MD, 12.5 mg at 11/26/23 1100   torsemide (DEMADEX) tablet 60 mg, 60 mg, Oral, BID, Darreld Mclean R, MD, 60 mg at 11/26/23 1610  Allergies: Allergies  Allergen Reactions   Amoxicillin-Pot Clavulanate Nausea And Vomiting    Projectile vomiting Did it involve swelling of the face/tongue/throat, SOB, or low BP? No Did it involve sudden or severe rash/hives, skin peeling, or any reaction on the inside of your mouth or nose? No Did you need to seek medical attention at a hospital or doctor's office? No When did it last happen?      20-30 years If all above answers are "NO", may proceed with cephalosporin use.    Adhesive [Tape] Other (See Comments)    Tears skin off - use paper tape     Codeine Other (See Comments)    hallucinations   Crestor [Rosuvastatin Calcium] Other (See Comments)    Severe muscle cramps   Gabapentin Other (See Comments)    Trembling   Lyrica [Pregabalin] Other (See Comments)    weakness   Morphine And Codeine Nausea And Vomiting and Other (See Comments)    Migraine headaches   Vicodin [Hydrocodone-Acetaminophen] Other (See Comments)    hallucinations   Latex Rash

## 2023-11-26 NOTE — TOC Initial Note (Signed)
Transition of Care [facility] - Initial/Assessment Note    Patient Details  Name: Maria [name] MRN: [id number] Date of Birth: Apr 01, 1956  Transition of Care [facility]) CM/SW Contact:    Otelia Santee, LCSW Phone Number: 11/26/2023, 2:40 PM  Clinical Narrative:                 Pt from home alone. Pt recommended for SNF placement. Pt has been recommended for SNF placement in the past however, a barrier has been pt being in her co-pay days for SNF and has been unable to afford the co-pay amounts. Pt still in co-pay days as she has not been out of facility/hospital for a 60-day period. Pt would be agreeable to SNF at [facility] however, reports she cannot afford co-pays.  VM left w/ pt's daughter to discuss options. Pt and family may need to begin seeking LTC.  Attempted to meet with pt again to discuss possible need for LTC however, pt being cleaned up by nursing. TOC will attempt to meet with pt again at a later time.   Expected Discharge Plan: Skilled Nursing Facility Barriers to Discharge: SNF Pending bed offer, Other (must enter comment) (SNF pending co-pays)   Patient Goals and CMS Choice Patient states their goals for this hospitalization and ongoing recovery are:: To go to [facility] for SNF     [place] ownership interest in [facility].provided to:: Patient    Expected Discharge Plan and Services In-house Referral: Clinical Social Work Discharge Planning Services: NA Post Acute Care Choice: Skilled Nursing Facility Living arrangements for the past 2 months: Apartment                 DME Arranged: N/A DME Agency: NA                  Prior Living Arrangements/Services Living arrangements for the past 2 months: Apartment Lives with:: Self Patient language and need for interpreter reviewed:: Yes Do you feel safe going back to the place where you live?: Yes      Need for Family Participation in Patient Care: Yes (Comment) Care giver support system in  place?: No (comment) Current home services: DME (rollator, cane, BSC) Criminal Activity/Legal Involvement Pertinent to Current Situation/Hospitalization: No - Comment as needed  Activities of Daily Living   ADL Screening (condition at time of admission) Independently performs ADLs?: No Does the patient have a NEW difficulty with bathing/dressing/toileting/self-feeding that is expected to last >3 days?: Yes (Initiates electronic notice to provider for possible OT consult) Does the patient have a NEW difficulty with getting in/out of bed, walking, or climbing stairs that is expected to last >3 days?: Yes (Initiates electronic notice to provider for possible PT consult) Does the patient have a NEW difficulty with communication that is expected to last >3 days?: No Is the patient deaf or have difficulty hearing?: No Does the patient have difficulty seeing, even when wearing glasses/contacts?: No Does the patient have difficulty concentrating, remembering, or making decisions?: No  Permission Sought/Granted Permission sought to share information with : Facility Medical sales representative, Family Supports Permission granted to share information with : Yes, Verbal Permission Granted  Share Information with NAME: Claudette Stapler  Permission granted to share info w AGENCY: SNF's        Emotional Assessment Appearance:: Appears stated age Attitude/Demeanor/Rapport: Engaged Affect (typically observed): Accepting Orientation: : Oriented to Self, Oriented to Place, Oriented to  Time, Oriented to Situation Alcohol / Substance Use: Not Applicable  Psych Involvement: Yes (comment)  Admission diagnosis:  Hallucinations [R44.3] Delirium [R41.0] Elevated CK [R74.8] Acute cystitis with hematuria [N30.01] Acute metabolic encephalopathy [G93.41] Patient Active Problem List   Diagnosis Date Noted   Pressure injury of skin 11/26/2023   Acute metabolic encephalopathy 11/25/2023   Hallucinations  09/02/2023   Stasis dermatitis of both legs 08/30/2023   Hypomagnesemia 08/02/2023   Ankle pain, right 08/01/2023   Weakness of both lower extremities 07/30/2023   Morbid obesity (HCC) 07/30/2023   (HFpEF) heart failure with preserved ejection fraction (HCC) 07/15/2023   Abdominal pain 07/15/2023   Acute renal failure superimposed on stage 3a chronic kidney disease (HCC) 07/15/2023   Acute on chronic respiratory failure with hypoxia and hypercapnia (HCC) 02/08/2023   Pneumonia due to infectious organism 02/05/2023   Chronic right-sided congestive heart failure (HCC) 02/04/2023   Fall 02/02/2023   Urinary tract infection 07/22/2022   Physical deconditioning 07/22/2022   Fluid overload 07/21/2022   Acute cystitis with hematuria    Dyspnea 12/19/2021   Leg swelling 11/20/2021   Bilateral knee pain 10/29/2021   Hypernatremia 09/24/2021   Cellulitis of lower extremity 09/24/2021   CKD (chronic kidney disease), stage IIIa (HCC) 09/24/2021   Sacral decubitus ulcer, stage II (HCC) 09/20/2021   Acute on chronic heart failure with preserved ejection fraction (HFpEF) (HCC) 09/19/2021   Nail disorder 07/07/2021   Pre-ulcerative corn or callous 07/07/2021   Rash 07/07/2021   Chronic diastolic CHF (congestive heart failure) (HCC) 03/17/2021   Type 2 diabetes mellitus with stage 3a chronic kidney disease, without long-term current use of insulin (HCC) 03/11/2021   Vitamin D deficiency 01/01/2021   Medial epicondylitis 12/31/2020   Aortic atherosclerosis (HCC) 09/26/2020   Umbilical hernia 09/26/2020   Constipation 08/05/2020   Arthritis 06/12/2020   Hoarseness 05/01/2020   B12 deficiency 01/31/2020   Rosacea 01/31/2020   Neck swelling 01/31/2020   History of colonic polyps    Benign neoplasm of [place]    Nausea 08/31/2019   Throat pain 07/26/2019   HLD (hyperlipidemia) 05/24/2019   Leg cramps 05/24/2019   Lump in neck 05/24/2019   Left thyroid nodule 01/25/2019   Jerking movements of  extremities 07/20/2018   Balance disorder 07/20/2018   Right knee pain 03/21/2018   OSA (obstructive sleep apnea) 03/21/2018   Oxygen desaturation 03/21/2018   Urinary symptom or sign 03/21/2018   Lymphedema 01/25/2018   Gait disorder 01/25/2018   Dysphonia 12/07/2017   Encounter for well adult exam with abnormal findings 11/08/2017   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   [place] polyp 08/11/2016   Abdominal pain, epigastric    Dysphagia    [place] cancer screening    ACE-inhibitor cough 12/05/2015   Restless legs syndrome 10/08/2015   Venous stasis dermatitis of both lower extremities 04/26/2015   GERD without esophagitis 10/17/2014   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/17/2014   AR (allergic rhinitis) 10/17/2014   Fatty liver 10/17/2014   Chronic pain syndrome 09/19/2014   Post-traumatic osteoarthritis of both knees 09/19/2014   Spinal stenosis 09/19/2014   Depression, major, single episode, complete remission (HCC) 09/19/2014   Narcotic dependence (HCC) 09/19/2014   Acute hypoxic respiratory failure (HCC) 08/20/2014   False positive HIV serology 06/02/2013   Hypokalemia 06/29/2012   Spondylosis of 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 03/09/2012   Lumbar disc disease 03/09/2012   Morbid obesity with body mass index (BMI) of 50.0 to 59.9 in adult (HCC) 06/17/2011   Chronic low back pain 12/28/2008   Anxiety  state 12/06/2007   Depression 12/06/2007   HTN (hypertension) 12/06/2007   Gastroesophageal reflux disease without esophagitis 12/06/2007   LOW BACK PAIN 12/06/2007   PCP:  Ignatius Specking, NP Pharmacy:   [facility] [id number] - [place], [place] - [address] [place] [address] Phone: [phone] Fax: [phone]  [facility] - [place], [place] - [address]  [address] [place] [address] Phone: [phone] Fax: [phone]     Social Determinants of Health  (SDOH) Social History: SDOH Screenings   Food Insecurity: No Food Insecurity (11/25/2023)  Recent Concern: Food Insecurity - Food Insecurity Present (09/06/2023)  Housing: Low Risk  (11/25/2023)  Transportation Needs: Unmet Transportation Needs (11/25/2023)  Utilities: Not At Risk (11/25/2023)  Alcohol Screen: Low Risk  (12/30/2021)  Depression (PHQ2-9): Medium Risk (01/30/2022)  Financial Resource Strain: Medium Risk (12/30/2021)  Physical Activity: Inactive (12/30/2021)  Social Connections: Unknown (09/17/2022)   [facility]  Stress: No Stress Concern Present (12/30/2021)  Tobacco Use: Medium Risk (11/26/2023)   SDOH Interventions: Transportation Interventions: Inpatient TOC, Patient Resources (Friends/Family), Intervention Not Indicated   Readmission Risk Interventions    09/06/2023    1:10 PM 07/22/2022    2:59 PM 04/11/2021   11:40 AM  Readmission Risk Prevention Plan  Transportation Screening Complete Complete Complete  PCP or Specialist Appt within 3-5 Days Complete Complete Complete  HRI or Home Care Consult Complete Complete Complete  Social Work Consult for Recovery Care Planning/Counseling Complete Complete Complete  Palliative Care Screening Not Applicable Complete Not Applicable  Medication Review Oceanographer) Complete Complete Complete

## 2023-11-26 NOTE — Assessment & Plan Note (Signed)
-   patient states that these started in September 2024. She denies prior history of them. Since Sept they appear to be worsening and affecting her lifestyle dramatically - Patient continues to have visual and auditory hallucinations even when feeling normal - Seen by psychiatry last hospitalization.  Again evaluated this hospitalization by psychiatry, appreciate assistance; Patient being tapered off of pramipexole - attempting for outpatient referral to neurology for further evaluation; she was also evaluated during hospitalization by neurology.  No other medication change recommendations.  Outpatient referral still recommended -MRI brain obtained on 11/28/2023 which was negative for acute abnormalities -Patient was hallucination free for over 48 hours at time of discharge

## 2023-11-26 NOTE — Assessment & Plan Note (Signed)
Resume home regimen. 

## 2023-11-26 NOTE — Assessment & Plan Note (Addendum)
-   low suspicion for real infection especially with hx of ESBL UTIs but for now planning on 3 doses rocephin; completed 4 doses

## 2023-11-26 NOTE — Care Management Obs Status (Signed)
MEDICARE OBSERVATION STATUS NOTIFICATION   Patient Details  Name: Maria [name] MRN: [id number] Date of Birth: April 13, 1956   Medicare Observation Status Notification Given:  Yes    Otelia Santee, LCSW 11/26/2023, 2:27 PM

## 2023-11-26 NOTE — Assessment & Plan Note (Signed)
Continue metoprolol, spironolactone, torsemide

## 2023-11-26 NOTE — Assessment & Plan Note (Signed)
-   Chronic and at baseline of bilateral lower extremities

## 2023-11-26 NOTE — Progress Notes (Addendum)
Patient was screaming and yelling loudly and called 911 by her phone often. RN came to her room with NT. Patient had inappropriate behavior and words toward staffing and still screaming. RN had to call security for safety and re-oriented patient. Patient still stated that she would call 911 again if no one helped her. Patient consistently said there was someone came to her room to poison her and stole her Ipad that was in her personal bag next to her. Charge nurse re-oriented patient to check what was in her bag and patient replied loudly : " do not treat me like a child" and repeated it multiple times. Charge RN educated patient as she was in hospital and just call staff for any assistance. Call light was tested and patient could use it properly. AC was notified as well.

## 2023-11-26 NOTE — Evaluation (Signed)
Occupational Therapy Evaluation Patient Details Name: Maria [name] MRN: [id number] DOB: November 30, 1956 Today's Date: 11/26/2023   History of Present Illness Pt is a 67 yr old female presenting to ED after being found down a home and reportedly experiencing hallucinations recently.  Pt found to have acute metabolic encephalopathy and UTI. PMH significant for chronic kidney disease stage III, lymphedema, hypertension, obstructive sleep apnea, diastolic congestive heart failure, chronic pain syndrome   Clinical Impression   The pt is currently limited by the below listed deficits, which compromise her ADL performance and overall functional independence (see OT problem list). During the session today, she required mod assist for supine to sit, min assist for upper body dressing seated EOB, and min assist to step-pivot to the bedside chair using a RW. She was noted to be with general deconditioning and slight weakness, as well as occasional confusion and hallucinations. At current, it would be difficult for the pt to return home alone and be able to manage all of her care needs. As such, short-term SNF rehab is recommended.      If plan is discharge home, recommend the following: Direct supervision/assist for medications management;Help with stairs or ramp for entrance;A lot of help with bathing/dressing/bathroom;Assistance with cooking/housework;Assist for transportation    Functional Status Assessment  Patient has had a recent decline in their functional status and demonstrates the ability to make significant improvements in function in a reasonable and predictable amount of time.  Equipment Recommendations  None recommended by OT    Recommendations for Other Services       Precautions / Restrictions Precautions Precautions: Fall Restrictions Weight Bearing Restrictions: No      Mobility Bed Mobility Overal bed mobility: Needs Assistance Bed Mobility: Supine to Sit     Supine to sit:  Mod assist, HOB elevated, Used rails     General bed mobility comments: Increased time and mod A; cues for weight shifting to scoot    Transfers Overall transfer level: Needs assistance Equipment used: Rolling walker (2 wheels) Transfers: Sit to/from Stand, Bed to chair/wheelchair/BSC Sit to Stand: Min assist, From elevated surface     Step pivot transfers: Min assist     General transfer comment: Bed elevated, increased time with min A and cues to stand.  Min A step pivot to chair      Balance       Sitting balance - Comments: static sitting-good. dynamic sitting-fair+       Standing balance comment: Min assist with RW              ADL either performed or assessed with clinical judgement   ADL Overall ADL's : Needs assistance/impaired Eating/Feeding: Independent;Sitting Eating/Feeding Details (indicate cue type and reason): at chair level, based on clinical judgement Grooming: Set up;Sitting Grooming Details (indicate cue type and reason): She performed face washing seated EOB.         Upper Body Dressing : Minimal assistance;Sitting Upper Body Dressing Details (indicate cue type and reason): She doffed a hospital gown and donned another clean one seated EOB. Lower Body Dressing: Moderate assistance                       Vision   Additional Comments: The pt correctly read the time depicted on the wall clock.            Pertinent Vitals/Pain Pain Assessment Pain Assessment: No/denies pain     Extremity/Trunk Assessment Upper Extremity Assessment Upper Extremity Assessment:  Right hand dominant;Overall [facility] for tasks assessed   Lower Extremity Assessment Lower Extremity Assessment: LLE deficits/detail;RLE deficits/detail RLE Deficits / Details:  (BLE AROM. Slight generalized weakness) LLE Deficits / Details:  (BLE AROM. Slight generalized weakness)     Communication Communication Communication: No apparent difficulties   Cognition  Arousal: Alert Behavior During Therapy: WFL for tasks assessed/performed Overall Cognitive Status: Impaired/Different from baseline        Problem Solving: Slow processing General Comments: Oriented x 4 (aware she was hallucinating); provided correct history; intermittent confusion throughout session; followed commands; RN reports still hallucinating at times     General Comments  Pt was wet/urinated at arrival ([employer] not working).  Assisted pt in cleaning at EOB.  Notified RN/NT            Home Living Family/patient expects to be discharged to:: Private residence Living Arrangements: Alone Available Help at Discharge: Other (Comment);Available PRN/intermittently (sister and niece but limited availability) Type of Home: Apartment Home Access: Stairs to enter [employer] of Steps: stairs spaced out from parking lot to door Entrance Stairs-Rails: Right Home Layout: One level     Bathroom Shower/Tub: Engineer, production Accessibility: No   Home Equipment: Hospital bed;Tub bench;Rolling Walker (2 wheels);Rollator (4 wheels);BSC/3in1 Adaptive Equipment: Reacher;Long-handled shoe horn Additional Comments: bedside commode frame placed over toilet      Prior Functioning/Environment Prior Level of Function : History of Falls (last six months);Independent/Modified Independent             Mobility Comments: Uses a RW in home;Rollator when she goes out; household and limited community ambulation ADLs Comments: BSC over toilet. Cooks microwave dinners mostly and sometimes orders out. Been independent with ADLs "just takes longer"; reports cleaning is difficult        OT Problem List: Decreased strength;Decreased activity tolerance;Impaired balance (sitting and/or standing);Decreased knowledge of use of DME or AE;Pain      OT Treatment/Interventions: Self-care/ADL training;Therapeutic exercise;Energy conservation;DME and/or AE instruction;Balance  training;Patient/family education;Therapeutic activities    OT Goals(Current goals can be found in the care plan section) Acute Rehab OT Goals OT Goal Formulation: With patient Time For Goal Achievement: 12/10/23 Potential to Achieve Goals: Good ADL Goals Pt Will Perform Upper Body Dressing: with set-up;sitting Pt Will Perform Lower Body Dressing: with supervision;sit to/from stand;sitting/lateral leans;with adaptive equipment Pt Will Transfer to Toilet: with supervision;ambulating Pt Will Perform Toileting - Clothing Manipulation and hygiene: with supervision;sit to/from stand  OT Frequency: Min 1X/week    Co-evaluation   Reason for Co-Treatment: Complexity of the patient's impairments (multi-system involvement);For patient/therapist safety PT goals addressed during session: Mobility/safety with mobility;Balance OT goals addressed during session: ADL's and self-care      AM-PAC OT "6 Clicks" Daily Activity     Outcome Measure Help from another person eating meals?: None Help from another person taking care of personal grooming?: A Little Help from another person toileting, which includes using toliet, bedpan, or urinal?: A Lot Help from another person bathing (including washing, rinsing, drying)?: A Lot Help from another person to put on and taking off regular upper body clothing?: A Little Help from another person to put on and taking off regular lower body clothing?: A Lot 6 Click Score: 16   End of Session Equipment Utilized During Treatment: Rolling walker (2 wheels) Nurse Communication: Mobility status  Activity Tolerance: Other (comment) (Fair+ tolerance) Patient left: in chair;with call bell/phone within reach;with chair alarm set  OT Visit Diagnosis: Muscle weakness (generalized) (M62.81);Unsteadiness  on feet (R26.81)                Time: 4098-1191 OT Time Calculation (min): 28 min Charges:  OT General Charges $OT Visit: 1 Visit OT Evaluation $OT Eval Moderate  Complexity: 1 Mod    Adilen Pavelko L Jermane Brayboy, OTR/L 11/26/2023, 1:06 PM

## 2023-11-26 NOTE — Assessment & Plan Note (Signed)
-   Stable and baseline.  No signs of cellulitis

## 2023-11-26 NOTE — Assessment & Plan Note (Addendum)
-   no s/s exacerbation  -Continue Toprol, Aldactone, torsemide

## 2023-11-26 NOTE — Progress Notes (Signed)
PHARMACY - PHYSICIAN COMMUNICATION CRITICAL VALUE ALERT - BLOOD CULTURE IDENTIFICATION (BCID)  Maria [name] is an 67 y.o. female who presented to [facility] on 11/25/2023 after being found down on the ground at home.  Assessment:  Update to 12/5 bcx: 1/4 bottles (same set as GPR reported previously, but different bottle) staph epi, methicillin resistant Pt on ceftriaxone for possible UTI Bcx likely contaminant  Name of physician (or Provider) Contacted: Dr. Frederick Peers  Current antibiotics: ceftriaxone  Changes to prescribed antibiotics recommended: none  Results for orders placed or performed during the hospital encounter of 11/25/23  Blood Culture ID Panel (Reflexed) (Collected: 11/25/2023  6:50 PM)  Result Value Ref Range   Enterococcus faecalis NOT DETECTED NOT DETECTED   Enterococcus Faecium NOT DETECTED NOT DETECTED   Listeria monocytogenes NOT DETECTED NOT DETECTED   Staphylococcus species DETECTED (A) NOT DETECTED   Staphylococcus aureus (BCID) NOT DETECTED NOT DETECTED   Staphylococcus epidermidis DETECTED (A) NOT DETECTED   Staphylococcus lugdunensis NOT DETECTED NOT DETECTED   Streptococcus species NOT DETECTED NOT DETECTED   Streptococcus agalactiae NOT DETECTED NOT DETECTED   Streptococcus pneumoniae NOT DETECTED NOT DETECTED   Streptococcus pyogenes NOT DETECTED NOT DETECTED   A.calcoaceticus-baumannii NOT DETECTED NOT DETECTED   Bacteroides fragilis NOT DETECTED NOT DETECTED   Enterobacterales NOT DETECTED NOT DETECTED   Enterobacter cloacae complex NOT DETECTED NOT DETECTED   Escherichia coli NOT DETECTED NOT DETECTED   Klebsiella aerogenes NOT DETECTED NOT DETECTED   Klebsiella oxytoca NOT DETECTED NOT DETECTED   Klebsiella pneumoniae NOT DETECTED NOT DETECTED   Proteus species NOT DETECTED NOT DETECTED   Salmonella species NOT DETECTED NOT DETECTED   Serratia marcescens NOT DETECTED NOT DETECTED   Haemophilus influenzae NOT DETECTED NOT DETECTED   Neisseria  meningitidis NOT DETECTED NOT DETECTED   Pseudomonas aeruginosa NOT DETECTED NOT DETECTED   Stenotrophomonas maltophilia NOT DETECTED NOT DETECTED   Candida albicans NOT DETECTED NOT DETECTED   Candida auris NOT DETECTED NOT DETECTED   Candida glabrata NOT DETECTED NOT DETECTED   Candida krusei NOT DETECTED NOT DETECTED   Candida parapsilosis NOT DETECTED NOT DETECTED   Candida tropicalis NOT DETECTED NOT DETECTED   Cryptococcus neoformans/gattii NOT DETECTED NOT DETECTED   Methicillin resistance mecA/C DETECTED (A) NOT DETECTED    Pricilla Riffle, PharmD, BCPS Clinical Pharmacist 11/26/2023 4:40 PM

## 2023-11-26 NOTE — Progress Notes (Signed)
Hypoglycemic Event  CBG: 62  Treatment: 8 oz of orange juice and snacks  Symptoms: none  Follow-up CBG: Time:0045 CBG Result: 92  Possible Reasons for Event: pt reported that she has not eaten all day   Comments/MD notified:Abigail Virgel Manifold, NP    Jule Ser
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
## 2023-11-26 NOTE — Assessment & Plan Note (Signed)
-   Prescription on database for oxycodone appears to be expired.  Last fill on 07/10/2023 for a 30-day supply.  Since then she has been following with pain management who has been prescribing oxycodone

## 2023-11-26 NOTE — Evaluation (Signed)
Physical Therapy Evaluation Patient Details Name: Maria [name] MRN: [id number] DOB: 1956-09-17 Today's Date: 11/26/2023  History of Present Illness  Pt is a 67 y.o. femlae presenting to ED with generalized weakness and reports of intermittent hallucinations on 11/26/23. Recently admitted to [facility] from 9/9 until 9/16 for generalized weakness and worsening bilateral leg pain.PMH significant for chronic kidney disease stage IIIa, lymphedema, hypertension, gastroesophageal reflux disease, obstructive sleep apnea, diastolic congestive heart failure (Echo 01/2023 EF 65-70%.  Clinical Impression  Pt admitted with above diagnosis. At baseline, pt resides alone and is independent with ADLs (w difficulty and increased time) and household/short community ambulator with RW or rollator.  She has limited support and reports recent falls.  Today,  Pt requiring mod A bed mobility, and min A to transfer to chair.  She was fatigued and unable to ambulate further.  Intermittent confusion during session.  Pt currently with functional limitations due to the deficits listed below (see PT Problem List). Pt will benefit from acute skilled PT to increase their independence and safety with mobility to allow discharge.  With limited support, confusion, easily fatigued, recent falls recommend Patient will benefit from continued inpatient follow up therapy, <3 hours/day at d/c.          If plan is discharge home, recommend the following: A lot of help with walking and/or transfers;A lot of help with bathing/dressing/bathroom   Can travel by private vehicle   Yes    Equipment Recommendations None recommended by PT  Recommendations for Other Services       Functional Status Assessment Patient has had a recent decline in their functional status and demonstrates the ability to make significant improvements in function in a reasonable and predictable amount of time.     Precautions / Restrictions  Precautions Precautions: Fall      Mobility  Bed Mobility Overal bed mobility: Needs Assistance Bed Mobility: Supine to Sit     Supine to sit: Mod assist, HOB elevated, Used rails     General bed mobility comments: Increased time and mod A; cues for weight shifting to scoot    Transfers Overall transfer level: Needs assistance Equipment used: Rolling walker (2 wheels) Transfers: Sit to/from Stand, Bed to chair/wheelchair/BSC Sit to Stand: Min assist, From elevated surface   Step pivot transfers: Min assist       General transfer comment: Bed elevated, increased time with min A and cues to stand.  Min A step pivot to chair    Ambulation/Gait Ambulation/Gait assistance: Min assist Gait Distance (Feet): 3 Feet Assistive device: Rolling walker (2 wheels) Gait Pattern/deviations: Step-to pattern, Decreased stride length Gait velocity: decreased     General Gait Details: only steps to chair; fatigued and denied further  Stairs            Wheelchair Mobility     Tilt Bed    Modified Rankin (Stroke Patients Only)       Balance Overall balance assessment: Needs assistance, History of Falls Sitting-balance support: No upper extremity supported Sitting balance-Leahy Scale: Fair     Standing balance support: Bilateral upper extremity supported Standing balance-Leahy Scale: Poor Standing balance comment: RW and min A                             Pertinent Vitals/Pain Pain Assessment Pain Assessment: No/denies pain    Home Living Family/patient expects to be discharged to:: Private residence Living Arrangements: Alone Available Help  at Discharge: Other (Comment);Available PRN/intermittently (sister and niece but limited availability) Type of Home: Apartment Home Access: Stairs to enter Entrance Stairs-Rails: Right Entrance Stairs-Number of Steps: stairs spaced out from parking lot to door   Home Layout: One level Home Equipment: Rollator  (4 wheels);Cane - single point;BSC/3in1;Shower seat;Hand held shower head;Grab bars - tub/shower;Adaptive equipment;Rolling Walker (2 wheels);Hospital bed;Tub bench      Prior Function Prior Level of Function : History of Falls (last six months);Independent/Modified Independent             Mobility Comments: Uses a RW in home;Rollator when she goes out; household and limited community ambulation ADLs Comments: BSC over toilet. Cooks microwave dinners mostly and sometimes orders out. Been independent with ADLs "just takes longer"; reports cleaning hard     Extremity/Trunk Assessment   Upper Extremity Assessment Upper Extremity Assessment: Defer to OT evaluation    Lower Extremity Assessment Lower Extremity Assessment: LLE deficits/detail;RLE deficits/detail RLE Deficits / Details: ROM WFL; MMT 5/5 knee and ankle; 4/5 hip LLE Deficits / Details: ROM WFL; MMT 5/5 knee and ankle; 4/5 hip    Cervical / Trunk Assessment Cervical / Trunk Assessment: Normal;Other exceptions Cervical / Trunk Exceptions: some limitations due to body habitus  Communication      Cognition Arousal: Alert Behavior During Therapy: WFL for tasks assessed/performed Overall Cognitive Status: Impaired/Different from baseline                               Problem Solving: Slow processing, Decreased initiation, Difficulty sequencing, Requires verbal cues, Requires tactile cues General Comments: Oriented x 4 (aware she was hallucinating); provided correct hx; intermittent confusion throughout session; followed commands; RN reports still hallucinating at times        General Comments General comments (skin integrity, edema, etc.): VSS; Pt was wet/urinated at arrival (Purewick not working).  Assisted pt in cleaning at EOB.  Notified RN/NT    Exercises     Assessment/Plan    PT Assessment Patient needs continued PT services  PT Problem List Decreased strength;Decreased range of motion;Decreased  activity tolerance;Decreased balance;Decreased mobility;Decreased knowledge of use of DME;Cardiopulmonary status limiting activity;Decreased cognition;Decreased safety awareness       PT Treatment Interventions DME instruction;Therapeutic exercise;Gait training;Balance training;Stair training;Functional mobility training;Therapeutic activities;Patient/family education;Cognitive remediation;Modalities;Neuromuscular re-education    PT Goals (Current goals can be found in the Care Plan section)  Acute Rehab PT Goals Patient Stated Goal: not stated PT Goal Formulation: With patient Time For Goal Achievement: 12/10/23 Potential to Achieve Goals: Good    Frequency Min 1X/week     Co-evaluation PT/OT/SLP Co-Evaluation/Treatment: Yes Reason for Co-Treatment: Complexity of the patient's impairments (multi-system involvement);For patient/therapist safety PT goals addressed during session: Mobility/safety with mobility;Balance OT goals addressed during session: ADL's and self-care       AM-PAC PT "6 Clicks" Mobility  Outcome Measure Help needed turning from your back to your side while in a flat bed without using bedrails?: A Lot Help needed moving from lying on your back to sitting on the side of a flat bed without using bedrails?: A Lot Help needed moving to and from a bed to a chair (including a wheelchair)?: A Little Help needed standing up from a chair using your arms (e.g., wheelchair or bedside chair)?: A Little Help needed to walk in hospital room?: Total Help needed climbing 3-5 steps with a railing? : Total 6 Click Score: 12    End of Session  Activity Tolerance: Patient tolerated treatment well Patient left: with chair alarm set;in chair;with call bell/phone within reach Nurse Communication: Mobility status PT Visit Diagnosis: Other abnormalities of gait and mobility (R26.89);Muscle weakness (generalized) (M62.81);History of falling (Z91.81)    Time: 1610-9604 PT Time  Calculation (min) (ACUTE ONLY): 30 min   Charges:   PT Evaluation $PT Eval Low Complexity: 1 Low   PT General Charges $$ ACUTE PT VISIT: 1 Visit         Anise Salvo, PT Acute Rehab [facility] [phone]   Rayetta Humphrey 11/26/2023, 11:28 AM

## 2023-11-26 NOTE — Assessment & Plan Note (Signed)
-   Not compliant on CPAP use at home

## 2023-11-26 NOTE — Consult Note (Signed)
WOC Nurse Consult Note: Reason for Consult: moisture damage behind knees, under breasts, and abdomen; stage 2 buttocks  Wound type: 1. Moisture Associated Skin Damage to buttocks  2.  Intertriginous dermatitis behind R knee, underneath breasts, and underneath pannus into groin  ICD-10 CM Codes for Irritant Dermatitis  L24A2 - Due to fecal, urinary or dual incontinence L30.4  - Erythema intertrigo. Also used for abrasion of the hand, chafing of the skin, dermatitis due to sweating and friction, friction dermatitis, friction eczema, and genital/thigh intertrigo.  Pressure Injury POA: NA, no pressure injury seen by this RN  Measurement: widespread MASD noted to bilateral buttocks/coccyx; widespread underneath breasts, pannus and inner thighs  Wound bed: erythema with scattered areas of partial thickness skin loss (linear) underneath breasts and pannus and buttocks, erythema with partial thickness skin loss behind R knee  Drainage (amount, consistency, odor) minimal serous  Periwound: patient with lymphedema noted to bilateral legs; lower legs noted to be erythematous but no skin breakdown noted, no weeping at time of this visit  Dressing procedure/placement/frequency: 1.  Clean underneath breasts, abdominal folds (pannus) and inner thighs with Vashe wound cleanser [place] [phone]), apply Nystatin cream to areas 3 times a day and sprinkle over Nystatin with Microguard antifungal powder (floor stock white and green label).   2.  Clean buttocks with Vashe wound cleanser, apply Gerhardt's Butt Cream 3 times a day and prn soiling.  May sprinkle over Gerhardt's with Microguard antifungal powder (floor stock white and green label).  3. Clean behind R knee with Vashe wound cleanser, apply a piece of silver hydrofiber to wound bed daily (Aquacel [place] [phone]) and secure with Kerlix roll gauze or silicone foam whichever preferred.   Secure chat to primary MD to assess areas to see if patient warrants oral  antifungal such as Diflucan.    POC discussed with patient and bedside nurse. Patient states she does not like InterDry AG (has used in the past and says she does not like the texture) which I told her is what I would usually recommend for underneath breasts and pannus.     Thank you,    Priscella Mann MSN, RN-BC, [employer] [phone]

## 2023-11-26 NOTE — Assessment & Plan Note (Signed)
-   Appreciate psychiatry evaluation.  See hallucinations as well.  Patient tapered off of pramipexole

## 2023-11-26 NOTE — Plan of Care (Signed)
  Problem: Coping: Goal: Ability to adjust to condition or change in health will improve Outcome: Progressing   Problem: Fluid Volume: Goal: Ability to maintain a balanced intake and output will improve Outcome: Progressing   Problem: Health Behavior/Discharge Planning: Goal: Ability to identify and utilize available resources and services will improve Outcome: Progressing   

## 2023-11-26 NOTE — Progress Notes (Signed)
PHARMACY - PHYSICIAN COMMUNICATION CRITICAL VALUE ALERT - BLOOD CULTURE IDENTIFICATION (BCID)  Maria [name] is an 67 y.o. female who presented to [facility] on 11/25/2023 with a chief complaint of AMS.   Assessment:  67 yo F presented with AMS. Per patient ongoing since September. Covering for possible UTI. Blood cx's 1/4 positive for GPR in just 1 anaerobic bottle. No BCID will be run.  Name of physician (or Provider) Contacted: Dr. Frederick Peers  Current antibiotics: Ceftriaxone  Changes to prescribed antibiotics recommended:  No change at this time F/U urine and blood cx results   No results found for this or any previous visit.  Enzo Bi, PharmD, BCPS, BCIDP Clinical Pharmacist 11/26/2023 11:03 AM

## 2023-11-27 DIAGNOSIS — N3000 Acute cystitis without hematuria: Secondary | ICD-10-CM | POA: Diagnosis not present

## 2023-11-27 DIAGNOSIS — R5381 Other malaise: Secondary | ICD-10-CM | POA: Diagnosis not present

## 2023-11-27 DIAGNOSIS — G9341 Metabolic encephalopathy: Secondary | ICD-10-CM | POA: Diagnosis not present

## 2023-11-27 DIAGNOSIS — R443 Hallucinations, unspecified: Secondary | ICD-10-CM | POA: Diagnosis not present

## 2023-11-27 LAB — HIV ANTIBODY (ROUTINE TESTING W REFLEX): HIV Screen 4th Generation wRfx: REACTIVE — AB

## 2023-11-27 LAB — SEDIMENTATION RATE: Sed Rate: 26 mm/h — ABNORMAL HIGH (ref 0–22)

## 2023-11-27 LAB — GLUCOSE, CAPILLARY
Glucose-Capillary: 87 mg/dL (ref 70–99)
Glucose-Capillary: 150 mg/dL — ABNORMAL HIGH (ref 70–99)
Glucose-Capillary: 86 mg/dL (ref 70–99)
Glucose-Capillary: 103 mg/dL — ABNORMAL HIGH (ref 70–99)

## 2023-11-27 LAB — CBC
HCT: 36.5 % (ref 36.0–46.0)
Hemoglobin: 11.7 g/dL — ABNORMAL LOW (ref 12.0–15.0)
MCH: 29.8 pg (ref 26.0–34.0)
MCHC: 32.1 g/dL (ref 30.0–36.0)
MCV: 93.1 fL (ref 80.0–100.0)
Platelets: 298 10*3/uL (ref 150–400)
RBC: 3.92 MIL/uL (ref 3.87–5.11)
RDW: 13.4 % (ref 11.5–15.5)
WBC: 8.7 10*3/uL (ref 4.0–10.5)
nRBC: 0 % (ref 0.0–0.2)

## 2023-11-27 LAB — TSH: TSH: 2.702 u[IU]/mL (ref 0.350–4.500)

## 2023-11-27 LAB — FOLATE: Folate: 13.5 ng/mL (ref >5.9)

## 2023-11-27 LAB — VITAMIN B12: Vitamin B-12: 784 pg/mL (ref 180–914)

## 2023-11-27 MED ORDER — ACETAMINOPHEN 650 MG RE SUPP
650.0000 mg | Freq: Four times a day (QID) | RECTAL | Status: DC | PRN
Start: 2023-11-27 — End: 2023-11-30

## 2023-11-27 MED ORDER — MELATONIN 5 MG PO TABS
5.0000 mg | ORAL_TABLET | Freq: Once | ORAL | Status: AC
  Administered 2023-11-27: 5 mg via ORAL
  Filled 2023-11-27: qty 1

## 2023-11-27 MED ORDER — KETOROLAC TROMETHAMINE 30 MG/ML IJ SOLN
30.0000 mg | Freq: Once | INTRAMUSCULAR | Status: AC
  Administered 2023-11-27: 30 mg via INTRAVENOUS
  Filled 2023-11-27: qty 1

## 2023-11-27 MED ORDER — ACETAMINOPHEN 325 MG PO TABS: 650.0000 mg | ORAL_TABLET | ORAL | Status: DC | PRN

## 2023-11-27 NOTE — Plan of Care (Signed)
  Problem: Education: Goal: Ability to describe self-care measures that may prevent or decrease complications (Diabetes Survival Skills Education) will improve Outcome: Progressing   Problem: Coping: Goal: Ability to adjust to condition or change in health will improve Outcome: Progressing   Problem: Health Behavior/Discharge Planning: Goal: Ability to manage health-related needs will improve Outcome: Progressing   Problem: Metabolic: Goal: Ability to maintain appropriate glucose levels will improve Outcome: Progressing   Problem: Nutritional: Goal: Maintenance of adequate nutrition will improve Outcome: Progressing   Problem: Skin Integrity: Goal: Risk for impaired skin integrity will decrease Outcome: Progressing   Problem: Tissue Perfusion: Goal: Adequacy of tissue perfusion will improve Outcome: Progressing   Problem: Health Behavior/Discharge Planning: Goal: Ability to manage health-related needs will improve Outcome: Progressing   Problem: Clinical Measurements: Goal: Will remain free from infection Outcome: Progressing Goal: Cardiovascular complication will be avoided Outcome: Progressing   Problem: Activity: Goal: Risk for activity intolerance will decrease Outcome: Progressing   Problem: Nutrition: Goal: Adequate nutrition will be maintained Outcome: Progressing   Problem: Coping: Goal: Level of anxiety will decrease Outcome: Progressing   Problem: Elimination: Goal: Will not experience complications related to bowel motility Outcome: Progressing Goal: Will not experience complications related to urinary retention Outcome: Progressing   Problem: Pain Management: Goal: General experience of comfort will improve Outcome: Progressing   Problem: Safety: Goal: Ability to remain free from injury will improve Outcome: Progressing   Problem: Skin Integrity: Goal: Risk for impaired skin integrity will decrease Outcome: Progressing

## 2023-11-27 NOTE — Plan of Care (Signed)
Discussed with patient plan of care for the evening, pain management and bedtime medications with some teach back from the patient at this time.  What is important to the m is rest at this time and reorientation from hallucinations.  Problem: Education: Goal: Knowledge of General Education information will improve Description: Including pain rating scale, medication(s)/side effects and non-pharmacologic comfort measures Outcome: Not Progressing   Problem: Health Behavior/Discharge Planning: Goal: Ability to manage health-related needs will improve Outcome: Not Progressing

## 2023-11-27 NOTE — Plan of Care (Signed)
In terms of managing the neuropsychiatric manifestations of Lewy body dementia, psychiatry often helps to manage the agitation seen in this condition.  Inpatient neurology is happy to be involved for any concern of rapidly progressive dementia (very rapid decline over the course of 2-3 months not otherwise explained by medical issues) in the hospital which has a distinct workup --however typical dementia is best managed by neurology on an outpatient basis  Of note with concern for hallucinations certainly Lewy body dementia would be on the differential, note this would be very very sensitive to dopamine antagonist and that would typically not be recommended  Discussed with Dr. Frederick Peers over the phone, he will reach out if there is concern for rapidly progressive decline or other inpatient neurological concern  These are curbside recommendations based upon the information readily available in the chart on brief review as well as history and examination information provided to me by requesting provider and do not replace a full detailed consult

## 2023-11-27 NOTE — Plan of Care (Signed)
Pt has been up all night talking to hallucinations. Multiple attempts to get out of bed confused. Woke up a few times kicking and hitting the air and scared. Problem: Coping: Goal: Ability to adjust to condition or change in health will improve Outcome: Not Progressing   Problem: Skin Integrity: Goal: Risk for impaired skin integrity will decrease Outcome: Not Progressing   Problem: Health Behavior/Discharge Planning: Goal: Ability to manage health-related needs will improve Outcome: Not Progressing
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
## 2023-11-27 NOTE — Progress Notes (Signed)
Progress Note    Maria [name]   GMW:[id number]  DOB: 11-22-1956  DOA: 11/25/2023     0 PCP: Ignatius Specking, NP  Initial CC: Found down at home  Hospital Course: Maria [name] is a 67 y.o. female with medical history significant for chronic HFpEF (EF 65-70%), CKD stage IIIa, T2DM, HTN, GERD, lymphedema, depression/anxiety, chronic pain syndrome, OSA not using CPAP who is admitted after being found down on the ground at home. She continues to live independently although prior hospitalizations she has been recommended for SNF and even consideration of long-term care but due to financial constraints, has not been able to transition to this.  She has also had prior issues with visual and auditory hallucinations but mostly visual.  She has been evaluated by psychiatry previously as well but the main recommendation has been to limit mind altering and sedating medications.  She has continued to remain on Flexeril, Cymbalta, Klonopin, Mirapex. Patient does not remember many of the details prior to hospitalization other than falling in her house and knocking over the microwave but then somehow ended up in the living room where she was discovered.  She was discovered after a welfare check was called. On workup she was again found to have potential UTI however she chronically has presumed colonized urine specimens.  She was started on antibiotics and admitted for further workup.  Interval History:  Patient up all night with more hallucinations to the point of calling 911 that she was so scared. This morning she again was afraid to even talk to me because she said there were other people in the room.  She did not recognize them as anyone she knew but she did not feel safe.  Tried reassuring her but she was tearful and scared.  Assessment and Plan: * Acute metabolic encephalopathy - recurrent issue as with prior hospitalizations; again treating potential infectious etiology but UA again not too  impressive from infection standpoint, plus she has continued to have hallucinations when in her normal state of health - will ask for repeat psychiatry evaluation in case of need for further workup; greatly appreciate. We will plan on getting patient outpt neuro referral as potentially could be Lewy Body or other explanation contributing - her pramipexole will also be tapered off per psychiatry - Treating UA with short course rocephin but she has known hx of ESBL Kleb   Hallucinations - patient states that these started in September 2024. She denies prior history of them. Since Sept they appear to be worsening and affecting her lifestyle dramatically - Patient continues to have visual and auditory hallucinations even when feeling normal - Seen by psychiatry last hospitalization.  Again evaluated this hospitalization by psychiatry, appreciate assistance; Patient being tapered off of pramipexole - attempting for outpatient referral to neurology for further evaluation in case of development of Lewy body dementia or other - patient up all night with hallucinations and calling 911 and not knowing where she is; similar to past episodes. With no infectious causes to explain and these also happening at home in "normal health" I also do not think it's sundowning either. Continues to raise concern for some sort of underlying dementia process; will discuss further with neurology  Physical deconditioning - Previously recommended for SNF by PT/OT but patient has been unable to afford -She is also likely in need of long-term care at this rate.  She continues to reconsider this each hospitalization - will see if TOC can assist in options  Urinary tract infection - low suspicion for real infection especially with hx of ESBL UTIs but for now planning on 3 days rocephin and watching urine culture  Chronic diastolic CHF (congestive heart failure) (HCC) - no s/s exacerbation  -Continue Toprol, Aldactone,  torsemide  Lymphedema - Chronic and at baseline of bilateral lower extremities  Stasis dermatitis of both legs - Stable and baseline.  No signs of cellulitis  Type 2 diabetes mellitus with stage 3a chronic kidney disease, without long-term current use of insulin (HCC) - Continue SSI and CBG monitoring  OSA (obstructive sleep apnea) - Not compliant on CPAP use at home  Restless legs syndrome - Appreciate psychiatry evaluation.  See hallucinations as well.  Patient being tapered off of pramipexole  GERD without esophagitis - Continue PPI  Morbid obesity with body mass index (BMI) of 50.0 to 59.9 in adult [facility]) - Complicates overall prognosis and care - Body mass index is 59.96 kg/m.  Chronic low back pain - Prescription on database for oxycodone appears to be expired.  Last fill on 07/10/2023 for a 30-day supply  HTN (hypertension) - Continue metoprolol, spironolactone, torsemide  Depression - Continue Cymbalta  Anxiety state - Chronically on Klonopin - Verified on database   Old records reviewed in assessment of this patient  Antimicrobials: Rocephin 11/25/2023 >> current  DVT prophylaxis:  Lovenox  Code Status:   Code Status: Full Code  Mobility Assessment (Last 72 Hours)     Mobility Assessment     Row Name 11/27/23 1000 11/26/23 2100 11/26/23 1126 11/26/23 1024 11/25/23 2300   Does patient have an order for bedrest or is patient medically unstable No - Continue assessment No - Continue assessment -- No - Continue assessment No - Continue assessment   What is the highest level of mobility based on the progressive mobility assessment? Level 4 (Walks with assist in room) - Balance while marching in place and cannot step forward and back - Complete Level 4 (Walks with assist in room) - Balance while marching in place and cannot step forward and back - Complete Level 4 (Walks with assist in room) - Balance while marching in place and cannot step forward and back -  Complete Level 4 (Walks with assist in room) - Balance while marching in place and cannot step forward and back - Complete Level 1 (Bedfast) - Unable to balance while sitting on edge of bed   Is the above level different from baseline mobility prior to current illness? Yes - Recommend PT order Yes - Recommend PT order -- Yes - Recommend PT order Yes - Recommend PT order            Barriers to discharge:  Disposition Plan:  TBD Status is: Obs  Objective: Blood pressure 130/65, pulse 80, temperature 97.8 F (36.6 C), temperature source Oral, resp. rate 20, height 5\' 3"  (1.6 m), weight (!) 137.4 kg, SpO2 93%.  Examination:  Physical Exam Constitutional:      Appearance: She is obese.     Comments: Anxious appearing from hallucinations  HENT:     Head: Normocephalic and atraumatic.     Mouth/Throat:     Mouth: Mucous membranes are moist.  Eyes:     Extraocular Movements: Extraocular movements intact.  Cardiovascular:     Rate and Rhythm: Normal rate and regular rhythm.  Pulmonary:     Effort: Pulmonary effort is normal. No respiratory distress.     Breath sounds: Normal breath sounds. No wheezing.  Abdominal:  General: Bowel sounds are normal. There is no distension.     Palpations: Abdomen is soft.     Tenderness: There is no abdominal tenderness.  Musculoskeletal:        General: Swelling present.     Cervical back: Normal range of motion and neck supple.     Comments: Chronic bilateral lower extremity stasis dermatitis noted along with chronic lower extremity lymphedema  Skin:    General: Skin is warm and dry.  Neurological:     General: No focal deficit present.     Mental Status: She is alert.  Psychiatric:        Mood and Affect: Mood is anxious.        Behavior: Behavior is cooperative.        Thought Content: Thought content is paranoid.      Consultants:  Psychiatry   Procedures:    Data Reviewed: Results for orders placed or performed during the  hospital encounter of 11/25/23 (from the past 24 hour(s))  Glucose, capillary     Status: Abnormal   Collection Time: 11/26/23  5:20 PM  Result Value Ref Range   Glucose-Capillary 114 (H) 70 - 99 mg/dL  Glucose, capillary     Status: None   Collection Time: 11/26/23 10:03 PM  Result Value Ref Range   Glucose-Capillary 81 70 - 99 mg/dL   Comment 1 Notify RN   Glucose, capillary     Status: None   Collection Time: 11/27/23  7:42 AM  Result Value Ref Range   Glucose-Capillary 87 70 - 99 mg/dL   Comment 1 Notify RN   Glucose, capillary     Status: Abnormal   Collection Time: 11/27/23 11:57 AM  Result Value Ref Range   Glucose-Capillary 150 (H) 70 - 99 mg/dL   Comment 1 Notify RN   Vitamin B12     Status: None   Collection Time: 11/27/23  1:52 PM  Result Value Ref Range   Vitamin B-12 784 180 - 914 pg/mL  Sedimentation rate     Status: Abnormal   Collection Time: 11/27/23  1:52 PM  Result Value Ref Range   Sed Rate 26 (H) 0 - 22 mm/hr  CBC     Status: Abnormal   Collection Time: 11/27/23  1:52 PM  Result Value Ref Range   WBC 8.7 4.0 - 10.5 K/uL   RBC 3.92 3.87 - 5.11 MIL/uL   Hemoglobin 11.7 (L) 12.0 - 15.0 g/dL   HCT 63.8 75.6 - 43.3 %   MCV 93.1 80.0 - 100.0 fL   MCH 29.8 26.0 - 34.0 pg   MCHC 32.1 30.0 - 36.0 g/dL   RDW 29.5 18.8 - 41.6 %   Platelets 298 150 - 400 K/uL   nRBC 0.0 0.0 - 0.2 %  TSH     Status: None   Collection Time: 11/27/23  1:52 PM  Result Value Ref Range   TSH 2.702 0.350 - 4.500 uIU/mL  Folate     Status: None   Collection Time: 11/27/23  1:52 PM  Result Value Ref Range   Folate 13.5 >5.9 ng/mL    I have reviewed pertinent nursing notes, vitals, labs, and images as necessary. I have ordered labwork to follow up on as indicated.  I have reviewed the last notes from staff over past 24 hours. I have discussed patient's care plan and test results with nursing staff, CM/SW, and other staff as appropriate.  Time spent: Greater than 50% of the  55  minute visit was spent in counseling/coordination of care for the patient as laid out in the A&P.   LOS: 0 days   Lewie Chamber, MD [facility] 11/27/2023, 4:44 PM

## 2023-11-28 ENCOUNTER — Observation Stay (HOSPITAL_COMMUNITY): Payer: 59

## 2023-11-28 DIAGNOSIS — G894 Chronic pain syndrome: Secondary | ICD-10-CM | POA: Diagnosis present

## 2023-11-28 DIAGNOSIS — T428X1A Poisoning by antiparkinsonism drugs and other central muscle-tone depressants, accidental (unintentional), initial encounter: Secondary | ICD-10-CM | POA: Diagnosis present

## 2023-11-28 DIAGNOSIS — N3001 Acute cystitis with hematuria: Secondary | ICD-10-CM | POA: Diagnosis present

## 2023-11-28 DIAGNOSIS — D631 Anemia in chronic kidney disease: Secondary | ICD-10-CM | POA: Diagnosis present

## 2023-11-28 DIAGNOSIS — G4733 Obstructive sleep apnea (adult) (pediatric): Secondary | ICD-10-CM | POA: Diagnosis present

## 2023-11-28 DIAGNOSIS — E1151 Type 2 diabetes mellitus with diabetic peripheral angiopathy without gangrene: Secondary | ICD-10-CM | POA: Diagnosis present

## 2023-11-28 DIAGNOSIS — I890 Lymphedema, not elsewhere classified: Secondary | ICD-10-CM | POA: Diagnosis present

## 2023-11-28 DIAGNOSIS — M545 Low back pain, unspecified: Secondary | ICD-10-CM | POA: Diagnosis present

## 2023-11-28 DIAGNOSIS — G2581 Restless legs syndrome: Secondary | ICD-10-CM | POA: Diagnosis present

## 2023-11-28 DIAGNOSIS — J309 Allergic rhinitis, unspecified: Secondary | ICD-10-CM | POA: Diagnosis present

## 2023-11-28 DIAGNOSIS — R5381 Other malaise: Secondary | ICD-10-CM | POA: Diagnosis not present

## 2023-11-28 DIAGNOSIS — W1830XA Fall on same level, unspecified, initial encounter: Secondary | ICD-10-CM | POA: Diagnosis present

## 2023-11-28 DIAGNOSIS — I130 Hypertensive heart and chronic kidney disease with heart failure and stage 1 through stage 4 chronic kidney disease, or unspecified chronic kidney disease: Secondary | ICD-10-CM | POA: Diagnosis present

## 2023-11-28 DIAGNOSIS — X58XXXA Exposure to other specified factors, initial encounter: Secondary | ICD-10-CM | POA: Diagnosis present

## 2023-11-28 DIAGNOSIS — I5032 Chronic diastolic (congestive) heart failure: Secondary | ICD-10-CM | POA: Diagnosis present

## 2023-11-28 DIAGNOSIS — G9341 Metabolic encephalopathy: Secondary | ICD-10-CM | POA: Diagnosis present

## 2023-11-28 DIAGNOSIS — K219 Gastro-esophageal reflux disease without esophagitis: Secondary | ICD-10-CM | POA: Diagnosis present

## 2023-11-28 DIAGNOSIS — R443 Hallucinations, unspecified: Secondary | ICD-10-CM

## 2023-11-28 DIAGNOSIS — T428X5A Adverse effect of antiparkinsonism drugs and other central muscle-tone depressants, initial encounter: Secondary | ICD-10-CM | POA: Diagnosis present

## 2023-11-28 DIAGNOSIS — E1122 Type 2 diabetes mellitus with diabetic chronic kidney disease: Secondary | ICD-10-CM | POA: Diagnosis present

## 2023-11-28 DIAGNOSIS — F32A Depression, unspecified: Secondary | ICD-10-CM | POA: Diagnosis present

## 2023-11-28 DIAGNOSIS — N1831 Chronic kidney disease, stage 3a: Secondary | ICD-10-CM | POA: Diagnosis present

## 2023-11-28 DIAGNOSIS — Y92009 Unspecified place in unspecified non-institutional (private) residence as the place of occurrence of the external cause: Secondary | ICD-10-CM | POA: Diagnosis not present

## 2023-11-28 DIAGNOSIS — E785 Hyperlipidemia, unspecified: Secondary | ICD-10-CM | POA: Diagnosis present

## 2023-11-28 DIAGNOSIS — Z6841 Body Mass Index (BMI) 40.0 and over, adult: Secondary | ICD-10-CM | POA: Diagnosis not present

## 2023-11-28 DIAGNOSIS — R296 Repeated falls: Secondary | ICD-10-CM | POA: Diagnosis present

## 2023-11-28 DIAGNOSIS — I872 Venous insufficiency (chronic) (peripheral): Secondary | ICD-10-CM | POA: Diagnosis present

## 2023-11-28 DIAGNOSIS — F411 Generalized anxiety disorder: Secondary | ICD-10-CM | POA: Diagnosis present

## 2023-11-28 DIAGNOSIS — F0393 Unspecified dementia, unspecified severity, with mood disturbance: Secondary | ICD-10-CM | POA: Diagnosis present

## 2023-11-28 LAB — GLUCOSE, CAPILLARY
Glucose-Capillary: 106 mg/dL — ABNORMAL HIGH (ref 70–99)
Glucose-Capillary: 136 mg/dL — ABNORMAL HIGH (ref 70–99)
Glucose-Capillary: 83 mg/dL (ref 70–99)
Glucose-Capillary: 103 mg/dL — ABNORMAL HIGH (ref 70–99)

## 2023-11-28 LAB — RPR: RPR Ser Ql: NONREACTIVE

## 2023-11-28 MED ORDER — ENOXAPARIN SODIUM 60 MG/0.6ML IJ SOSY
60.0000 mg | PREFILLED_SYRINGE | INTRAMUSCULAR | Status: DC
  Administered 2023-11-29 – 2023-11-30 (×2): 60 mg via SUBCUTANEOUS
  Filled 2023-11-28 (×2): qty 0.6

## 2023-11-28 MED ORDER — LORAZEPAM 2 MG/ML IJ SOLN
1.0000 mg | Freq: Once | INTRAMUSCULAR | Status: AC | PRN
  Administered 2023-11-28: 1 mg via INTRAVENOUS
  Filled 2023-11-28: qty 1

## 2023-11-28 MED ORDER — OXYCODONE HCL 5 MG PO TABS: 5.0000 mg | ORAL_TABLET | Freq: Two times a day (BID) | ORAL | Status: DC | PRN

## 2023-11-28 MED ORDER — ORAL CARE MOUTH RINSE: 15.0000 mL | OROMUCOSAL | Status: DC | PRN

## 2023-11-28 MED ORDER — OXYCODONE HCL 5 MG PO TABS
10.0000 mg | ORAL_TABLET | Freq: Two times a day (BID) | ORAL | Status: DC
  Administered 2023-11-28 – 2023-11-30 (×4): 10 mg via ORAL
  Filled 2023-11-28 (×4): qty 2

## 2023-11-28 MED ORDER — KETOROLAC TROMETHAMINE 30 MG/ML IJ SOLN
15.0000 mg | Freq: Once | INTRAMUSCULAR | Status: AC
  Administered 2023-11-29: 15 mg via INTRAVENOUS
  Filled 2023-11-28: qty 1

## 2023-11-28 NOTE — Progress Notes (Signed)
Progress Note    Maria [name]   [id number]  DOB: May 15, 1956  DOA: 11/25/2023     0 PCP: Ignatius Specking, NP  Initial CC: Found down at home  Hospital Course: Maria [name] is a 67 y.o. female with medical history significant for chronic HFpEF (EF 65-70%), CKD stage IIIa, T2DM, HTN, GERD, lymphedema, depression/anxiety, chronic pain syndrome, OSA not using CPAP who is admitted after being found down on the ground at home. She continues to live independently although prior hospitalizations she has been recommended for SNF and even consideration of long-term care but due to financial constraints, has not been able to transition to this.  She has also had prior issues with visual and auditory hallucinations but mostly visual.  She has been evaluated by psychiatry previously as well but the main recommendation has been to limit mind altering and sedating medications.  She has continued to remain on Flexeril, Cymbalta, Klonopin, Mirapex. Patient does not remember many of the details prior to hospitalization other than falling in her house and knocking over the microwave but then somehow ended up in the living room where she was discovered.  She was discovered after a welfare check was called. On workup she was again found to have potential UTI however she chronically has presumed colonized urine specimens.  She was started on antibiotics and admitted for further workup.  Interval History:  Patient doing a little bit better today.  Seems more rested and not having active hallucinations when seen this morning.  She was however very sleepy.  Assessment and Plan: * Acute metabolic encephalopathy - recurrent issue as with prior hospitalizations; again treating potential infectious etiology but UA again not too impressive from infection standpoint, plus she has continued to have hallucinations when in her normal state of health - will ask for repeat psychiatry evaluation in case of need for further  workup; greatly appreciate. We will plan on getting patient outpt neuro referral as potentially could be Lewy Body or other explanation contributing - her pramipexole will also be tapered off per psychiatry - Treating UA with short course rocephin but she has known hx of ESBL Kleb   Hallucinations - patient states that these started in September 2024. She denies prior history of them. Since Sept they appear to be worsening and affecting her lifestyle dramatically - Patient continues to have visual and auditory hallucinations even when feeling normal - Seen by psychiatry last hospitalization.  Again evaluated this hospitalization by psychiatry, appreciate assistance; Patient being tapered off of pramipexole - attempting for outpatient referral to neurology for further evaluation in case of development of Lewy body dementia or other - patient up all night with hallucinations and calling 911 and not knowing where she is; similar to past episodes. With no infectious causes to explain and these also happening at home in "normal health" I also do not think it's sundowning either. Continues to raise concern for some sort of underlying dementia process; will discuss further with neurology -MRI brain ordered after discussion with neurology; patient amenable also  Physical deconditioning - Previously recommended for SNF by PT/OT but patient has been unable to afford -She is also likely in need of long-term care at this rate.  She continues to reconsider this each hospitalization - will see if TOC can assist in options   Chronic diastolic CHF (congestive heart failure) (HCC) - no s/s exacerbation  -Continue Toprol, Aldactone, torsemide  Lymphedema - Chronic and at baseline of bilateral lower extremities  Urinary tract infection-resolved as of 11/28/2023 - low suspicion for real infection especially with hx of ESBL UTIs but for now planning on 3 doses rocephin; completed 4 doses  Stasis dermatitis of  both legs - Stable and baseline.  No signs of cellulitis  Type 2 diabetes mellitus with stage 3a chronic kidney disease, without long-term current use of insulin (HCC) - Continue SSI and CBG monitoring  OSA (obstructive sleep apnea) - Not compliant on CPAP use at home  Restless legs syndrome - Appreciate psychiatry evaluation.  See hallucinations as well.  Patient being tapered off of pramipexole  GERD without esophagitis - Continue PPI  Morbid obesity with body mass index (BMI) of 50.0 to 59.9 in adult [facility]) - Complicates overall prognosis and care - Body mass index is 59.96 kg/m.  Chronic low back pain - Prescription on database for oxycodone appears to be expired.  Last fill on 07/10/2023 for a 30-day supply  HTN (hypertension) - Continue metoprolol, spironolactone, torsemide  Depression - Continue Cymbalta  Anxiety state - Chronically on Klonopin - Verified on database   Old records reviewed in assessment of this patient  Antimicrobials: Rocephin 11/25/2023 >> 12/8  DVT prophylaxis:  Lovenox  Code Status:   Code Status: Full Code  Mobility Assessment (Last 72 Hours)     Mobility Assessment     Row Name 11/28/23 0755 11/27/23 2000 11/27/23 1000 11/26/23 2100 11/26/23 1126   Does patient have an order for bedrest or is patient medically unstable No - Continue assessment No - Continue assessment No - Continue assessment No - Continue assessment --   What is the highest level of mobility based on the progressive mobility assessment? Level 4 (Walks with assist in room) - Balance while marching in place and cannot step forward and back - Complete Level 4 (Walks with assist in room) - Balance while marching in place and cannot step forward and back - Complete Level 4 (Walks with assist in room) - Balance while marching in place and cannot step forward and back - Complete Level 4 (Walks with assist in room) - Balance while marching in place and cannot step forward and back  - Complete Level 4 (Walks with assist in room) - Balance while marching in place and cannot step forward and back - Complete   Is the above level different from baseline mobility prior to current illness? Yes - Recommend PT order Yes - Recommend PT order Yes - Recommend PT order Yes - Recommend PT order --    Row Name 11/26/23 1024 11/25/23 2300         Does patient have an order for bedrest or is patient medically unstable No - Continue assessment No - Continue assessment      What is the highest level of mobility based on the progressive mobility assessment? Level 4 (Walks with assist in room) - Balance while marching in place and cannot step forward and back - Complete Level 1 (Bedfast) - Unable to balance while sitting on edge of bed      Is the above level different from baseline mobility prior to current illness? Yes - Recommend PT order Yes - Recommend PT order               Barriers to discharge:  Disposition Plan:  TBD Status is: Obs  Objective: Blood pressure (!) 110/51, pulse 71, temperature 97.8 F (36.6 C), resp. rate 20, height 5\' 3"  (1.6 m), weight (!) 139.9 kg, SpO2 97%.  Examination:  Physical Exam Constitutional:      Appearance: She is obese.     Comments: Less anxious  HENT:     Head: Normocephalic and atraumatic.     Mouth/Throat:     Mouth: Mucous membranes are moist.  Eyes:     Extraocular Movements: Extraocular movements intact.  Cardiovascular:     Rate and Rhythm: Normal rate and regular rhythm.  Pulmonary:     Effort: Pulmonary effort is normal. No respiratory distress.     Breath sounds: Normal breath sounds. No wheezing.  Abdominal:     General: Bowel sounds are normal. There is no distension.     Palpations: Abdomen is soft.     Tenderness: There is no abdominal tenderness.  Musculoskeletal:        General: Swelling present.     Cervical back: Normal range of motion and neck supple.     Comments: Chronic bilateral lower extremity stasis  dermatitis noted along with chronic lower extremity lymphedema  Skin:    General: Skin is warm and dry.  Neurological:     General: No focal deficit present.     Mental Status: She is alert.  Psychiatric:        Mood and Affect: Mood is anxious.        Behavior: Behavior is cooperative.        Thought Content: Thought content is paranoid.      Consultants:  Psychiatry   Procedures:    Data Reviewed: Results for orders placed or performed during the hospital encounter of 11/25/23 (from the past 24 hour(s))  Glucose, capillary     Status: None   Collection Time: 11/27/23  5:08 PM  Result Value Ref Range   Glucose-Capillary 86 70 - 99 mg/dL   Comment 1 Notify RN   Glucose, capillary     Status: Abnormal   Collection Time: 11/27/23  9:34 PM  Result Value Ref Range   Glucose-Capillary 103 (H) 70 - 99 mg/dL  Glucose, capillary     Status: Abnormal   Collection Time: 11/28/23  8:03 AM  Result Value Ref Range   Glucose-Capillary 106 (H) 70 - 99 mg/dL   Comment 1 Notify RN   Glucose, capillary     Status: Abnormal   Collection Time: 11/28/23 11:44 AM  Result Value Ref Range   Glucose-Capillary 136 (H) 70 - 99 mg/dL    I have reviewed pertinent nursing notes, vitals, labs, and images as necessary. I have ordered labwork to follow up on as indicated.  I have reviewed the last notes from staff over past 24 hours. I have discussed patient's care plan and test results with nursing staff, CM/SW, and other staff as appropriate.  Time spent: Greater than 50% of the 55 minute visit was spent in counseling/coordination of care for the patient as laid out in the A&P.   LOS: 0 days   Lewie Chamber, MD [facility] 11/28/2023, 2:23 PM

## 2023-11-28 NOTE — Plan of Care (Signed)

## 2023-11-28 NOTE — Progress Notes (Signed)
Patient was sleeping when arriving to shift and had been doing so most of the day.  She is having a hard time sleeping and having more hallucinations tonight.  Please, try to keep her up during the day especially just before dinner and until night time shift change.  This may help her confusion, decrease her hallucinations and sleep a regular sleep cycle.

## 2023-11-29 DIAGNOSIS — R443 Hallucinations, unspecified: Secondary | ICD-10-CM | POA: Diagnosis not present

## 2023-11-29 DIAGNOSIS — R5381 Other malaise: Secondary | ICD-10-CM | POA: Diagnosis not present

## 2023-11-29 DIAGNOSIS — G9341 Metabolic encephalopathy: Secondary | ICD-10-CM | POA: Diagnosis not present

## 2023-11-29 LAB — CULTURE, BLOOD (ROUTINE X 2): Special Requests: ADEQUATE

## 2023-11-29 LAB — GLUCOSE, CAPILLARY
Glucose-Capillary: 96 mg/dL (ref 70–99)
Glucose-Capillary: 122 mg/dL — ABNORMAL HIGH (ref 70–99)
Glucose-Capillary: 93 mg/dL (ref 70–99)
Glucose-Capillary: 96 mg/dL (ref 70–99)

## 2023-11-29 MED ORDER — GUAIFENESIN-DM 100-10 MG/5ML PO SYRP
5.0000 mL | ORAL_SOLUTION | ORAL | Status: DC | PRN
  Administered 2023-11-29: 5 mL via ORAL
  Filled 2023-11-29: qty 5

## 2023-11-29 NOTE — Plan of Care (Signed)

## 2023-11-29 NOTE — Progress Notes (Signed)
Progress Note    Maria [name]   ION:[id number]  DOB: 1956/11/17  DOA: 11/25/2023     1 PCP: Ignatius Specking, NP  Initial CC: Found down at home  Hospital Course: Maria [name] is a 67 y.o. female with medical history significant for chronic HFpEF (EF 65-70%), CKD stage IIIa, T2DM, HTN, GERD, lymphedema, depression/anxiety, chronic pain syndrome, OSA not using CPAP who is admitted after being found down on the ground at home. She continues to live independently although prior hospitalizations she has been recommended for SNF and even consideration of long-term care but due to financial constraints, has not been able to transition to this.  She has also had prior issues with visual and auditory hallucinations but mostly visual.  She has been evaluated by psychiatry previously as well but the main recommendation has been to limit mind altering and sedating medications.  She has continued to remain on Flexeril, Cymbalta, Klonopin, Mirapex. Patient does not remember many of the details prior to hospitalization other than falling in her house and knocking over the microwave but then somehow ended up in the living room where she was discovered.  She was discovered after a welfare check was called. On workup she was again found to have potential UTI however she chronically has presumed colonized urine specimens.  She was started on antibiotics and admitted for further workup.  Interval History:  Mood seems improved today and she is more calm.  Not having hallucinations this morning.  Tentative plan will be for again going home with home health as she is unable to afford co-pay days for going to rehab.  Assessment and Plan: * Acute metabolic encephalopathy - recurrent issue as with prior hospitalizations; again treating potential infectious etiology but UA again not too impressive from infection standpoint, plus she has continued to have hallucinations when in her normal state of health - will ask  for repeat psychiatry evaluation in case of need for further workup; greatly appreciate. We will plan on getting patient outpt neuro referral as well - her pramipexole has been tapered off per psychiatry - Treating UA with short course rocephin but she has known hx of ESBL Kleb   Hallucinations - patient states that these started in September 2024. She denies prior history of them. Since Sept they appear to be worsening and affecting her lifestyle dramatically - Patient continues to have visual and auditory hallucinations even when feeling normal - Seen by psychiatry last hospitalization.  Again evaluated this hospitalization by psychiatry, appreciate assistance; Patient being tapered off of pramipexole - attempting for outpatient referral to neurology for further evaluation; she was also evaluated during hospitalization by neurology.  No other medication change recommendations.  Outpatient referral still recommended -MRI brain obtained on 11/28/2023 which was negative for acute abnormalities  Physical deconditioning - Previously recommended for SNF by PT/OT but patient has been unable to afford -She is also likely in need of long-term care at this rate.  She continues to reconsider this each hospitalization -For now, we will again plan on discharging home with home health after discussions with patient  Chronic diastolic CHF (congestive heart failure) (HCC) - no s/s exacerbation  -Continue Toprol, Aldactone, torsemide  Lymphedema - Chronic and at baseline of bilateral lower extremities  Urinary tract infection-resolved as of 11/28/2023 - low suspicion for real infection especially with hx of ESBL UTIs but for now planning on 3 doses rocephin; completed 4 doses  Stasis dermatitis of both legs - Stable and baseline.  No signs of cellulitis  Type 2 diabetes mellitus with stage 3a chronic kidney disease, without long-term current use of insulin (HCC) - Continue SSI and CBG monitoring  OSA  (obstructive sleep apnea) - Not compliant on CPAP use at home  Restless legs syndrome - Appreciate psychiatry evaluation.  See hallucinations as well.  Patient being tapered off of pramipexole  GERD without esophagitis - Continue PPI  Morbid obesity with body mass index (BMI) of 50.0 to 59.9 in adult [facility]) - Complicates overall prognosis and care - Body mass index is 59.96 kg/m.  Chronic low back pain - Prescription on database for oxycodone appears to be expired.  Last fill on 07/10/2023 for a 30-day supply.  Since then she has been following with pain management who has been prescribing oxycodone  HTN (hypertension) - Continue metoprolol, spironolactone, torsemide  Depression - Continue Cymbalta  Anxiety state - Chronically on Klonopin - Verified on database   Old records reviewed in assessment of this patient  Antimicrobials: Rocephin 11/25/2023 >> 12/8  DVT prophylaxis:  Lovenox  Code Status:   Code Status: Full Code  Mobility Assessment (Last 72 Hours)     Mobility Assessment     Row Name 11/29/23 1243 11/29/23 0952 11/28/23 2156 11/28/23 0755 11/27/23 2000   Does patient have an order for bedrest or is patient medically unstable -- No - Continue assessment No - Continue assessment No - Continue assessment No - Continue assessment   What is the highest level of mobility based on the progressive mobility assessment? Level 4 (Walks with assist in room) - Balance while marching in place and cannot step forward and back - Complete Level 5 (Walks with assist in room/hall) - Balance while stepping forward/back and can walk in room with assist - Complete Level 5 (Walks with assist in room/hall) - Balance while stepping forward/back and can walk in room with assist - Complete Level 4 (Walks with assist in room) - Balance while marching in place and cannot step forward and back - Complete Level 4 (Walks with assist in room) - Balance while marching in place and cannot step  forward and back - Complete   Is the above level different from baseline mobility prior to current illness? -- Yes - Recommend PT order Yes - Recommend PT order Yes - Recommend PT order Yes - Recommend PT order    Row Name 11/27/23 1000 11/26/23 2100         Does patient have an order for bedrest or is patient medically unstable No - Continue assessment No - Continue assessment      What is the highest level of mobility based on the progressive mobility assessment? Level 4 (Walks with assist in room) - Balance while marching in place and cannot step forward and back - Complete Level 4 (Walks with assist in room) - Balance while marching in place and cannot step forward and back - Complete      Is the above level different from baseline mobility prior to current illness? Yes - Recommend PT order Yes - Recommend PT order               Barriers to discharge:  Disposition Plan: Home with home health Status is: Inpatient  Objective: Blood pressure (!) 131/52, pulse 74, temperature 98.3 F (36.8 C), temperature source Oral, resp. rate 18, height 5\' 3"  (1.6 m), weight (!) 139.9 kg, SpO2 95%.  Examination:  Physical Exam Constitutional:      Appearance: She is  obese.  HENT:     Head: Normocephalic and atraumatic.     Mouth/Throat:     Mouth: Mucous membranes are moist.  Eyes:     Extraocular Movements: Extraocular movements intact.  Cardiovascular:     Rate and Rhythm: Normal rate and regular rhythm.  Pulmonary:     Effort: Pulmonary effort is normal. No respiratory distress.     Breath sounds: Normal breath sounds. No wheezing.  Abdominal:     General: Bowel sounds are normal. There is no distension.     Palpations: Abdomen is soft.     Tenderness: There is no abdominal tenderness.  Musculoskeletal:        General: Swelling present.     Cervical back: Normal range of motion and neck supple.     Comments: Chronic bilateral lower extremity stasis dermatitis noted along with  chronic lower extremity lymphedema  Skin:    General: Skin is warm and dry.  Neurological:     General: No focal deficit present.     Mental Status: She is alert.  Psychiatric:        Mood and Affect: Mood and affect normal. Mood is not anxious.        Behavior: Behavior is cooperative.        Thought Content: Thought content is not paranoid.      Consultants:  Psychiatry   Procedures:    Data Reviewed: Results for orders placed or performed during the hospital encounter of 11/25/23 (from the past 24 hour(s))  Glucose, capillary     Status: None   Collection Time: 11/28/23  5:11 PM  Result Value Ref Range   Glucose-Capillary 83 70 - 99 mg/dL  Glucose, capillary     Status: Abnormal   Collection Time: 11/28/23  8:27 PM  Result Value Ref Range   Glucose-Capillary 103 (H) 70 - 99 mg/dL  Glucose, capillary     Status: None   Collection Time: 11/29/23  7:31 AM  Result Value Ref Range   Glucose-Capillary 96 70 - 99 mg/dL  Glucose, capillary     Status: Abnormal   Collection Time: 11/29/23 11:57 AM  Result Value Ref Range   Glucose-Capillary 122 (H) 70 - 99 mg/dL    I have reviewed pertinent nursing notes, vitals, labs, and images as necessary. I have ordered labwork to follow up on as indicated.  I have reviewed the last notes from staff over past 24 hours. I have discussed patient's care plan and test results with nursing staff, CM/SW, and other staff as appropriate.    LOS: 1 day   Lewie Chamber, MD [facility] 11/29/2023, 1:43 PM

## 2023-11-29 NOTE — Consult Note (Signed)
Maria [name] Psychiatry Consult Evaluation  Service Date: November 29, 2023 LOS:  LOS: 1 day    Primary Psychiatric Diagnoses  Depression 2.  Anxiety   Assessment  HPI  Maria [name] is a 67 y.o. female admitted medically for 11/25/2023  5:18 PM for AMS. She carries the psychiatric diagnoses of Depression and anxiety and has a past medical history of  DM2, CHS HTN, OSA . Psychiatry was consulted for visual hallucination.   Briefly, she was seen by Dr. Norberto Sorenson who felt visual hallucination 2/2 Mirapex - pt only starte dhving hallucinations in context of UTI, and has no psych hx prior to encephalopathy a few months ago. She was managed with dc of mirapex (dopaminergic) as no significant psych hx. Psych reconsulted over the weekend; unfortunately not seen over the weekend. Through all of this, has had relatively clear sensorium - no delusions, paranoia, thought disorganization, had good reality differentiation - all pointing to med s/e or neuro cause > primary psychiatric d/o.  She may need to be ruled out for any organic neurological causes; particularly Lewy Body Dementia where it could present with [facility] and memory issues before physical symptoms. Of note, she initially reported slight cognitive decline as well as a resting tremor.    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  -- Recommend outpatient neurology follow up   ## Medical Decision Making Capacity:  - Intact  ## Further Work-up:  -- consider ferritin/iron panel.    ## Disposition:  -- There are no psychiatric contraindications to discharge at this time  ## Behavioral / Environmental:  --  Delirium precaution    ## Safety and Observation Level:  - Based on my clinical evaluation, I estimate the patient to be at low risk of self harm in the current setting - This decision is based on my review of the chart including patient's history and current presentation, interview of the patient, mental status examination, and consideration of suicide risk including evaluating suicidal ideation, plan, intent, suicidal or self-harm behaviors, risk factors, and protective factors. This judgment is based on our ability to directly address suicide risk, implement suicide prevention strategies and develop a safety plan while the patient is in the clinical setting. Please contact our team if there is a concern that risk level has changed.  Suicide risk assessment   Patient has following non-modifiable or demographic risk factors for suicide: none  Patient has the following protective factors against suicide: no SI/SIB   Thank you for this consult request. Recommendations have been communicated to the primary team.  We will sign off at this time.   [name]  Psychiatric and Social History  Assessmetn - as above - pt overjoyed her sx got better with cessation rather than addition of medication. She sees a therapist she finds very helpful; discussed next steps (potential iron panel, f/u with neuro > psych). No SI,  HI, and thankfully no AH/VH x a couple days. She asked me about getting something warm for her shoulder pain (heating pad or blanket) -  passed request onto nurse who was going into her room.   Psych ROS:  Depression: denied Anxiety:  chronic baseline anxiety Mania (lifetime and current): denied Psychosis: (lifetime and current): denied    Prior ECT: none Prior Psych Hospitalization: none  Prior Self Harm: none Prior Violence: none  Family Hx suicide: denied  Social History:  Lives alone; has neighbors who help her Access to weapons: denied  Substance History Tobacco use: denied Alcohol use: denied Drug use: denied   Exam Findings   Psychiatric Specialty Exam:  Presentation  General Appearance: Appropriate for Environment; Casual  Eye Contact:Good  Speech:Clear and Coherent; Normal Rate  Speech Volume:Normal  Handedness:Right   Mood and Affect  Mood:-- (Overjoyed)  Affect:Appropriate; Full Range   Thought Process  Thought Processes:Coherent; Linear  Descriptions of Associations:Intact  Orientation:Full (Time, Place and Person)  Thought Content:Logical  Hallucinations:Hallucinations: None  Ideas of Reference:None  Suicidal Thoughts:Suicidal Thoughts: No  Homicidal Thoughts:Homicidal Thoughts: No   Sensorium  Memory:Immediate Fair; Recent Good; Remote Good  Judgment:Fair  Insight:Fair   Executive Functions  Concentration:Good  Attention Span:Good  Recall:Good  Fund of Knowledge:Good  Language:Good   Psychomotor Activity  Psychomotor Activity:Psychomotor Activity: Normal   Assets  Assets:Communication Skills; Desire for Improvement; Financial Resources/Insurance; Social Support   Sleep  Sleep:Sleep: Fair    Physical Exam: Vital signs:  Temp:  [95.4 F (35.2 C)-98.4 F (36.9 C)] 98.3 F (36.8 C) (12/09 1313) Pulse Rate:  [73-80] 74 (12/09 1313) Resp:  [15-18] 18 (12/09 1313) BP: (129-148)/(52-84) 131/52 (12/09 1313) SpO2:  [90 %-96 %] 95 % (12/09 1313) Physical Exam HENT:     Head: Normocephalic.  Pulmonary:     Effort: Pulmonary effort is  normal.  Neurological:     Mental Status: She is alert.     Blood pressure (!) 131/52, pulse 74, temperature 98.3 F (36.8 C), temperature source Oral, resp. rate 18, height 5\' 3"  (1.6 m), weight (!) 139.9 kg, SpO2 95%. Body mass index is 54.63 kg/m.   Other History   These have been pulled in through the EMR, reviewed, and updated if appropriate.   Family History:   The patient's family history includes Anxiety disorder in her sister; Asthma in her daughter and sister; Bipolar disorder in her daughter; Cancer in her maternal uncle and another family member; Depression in her sister; Diabetes in her mother; Heart disease in her mother; Heart failure in her mother; Hypertension in her father and mother; Liver cancer in her paternal grandmother.  Medical History: Past Medical History:  Diagnosis Date   Acute lymphadenitis 2011   ALLERGIC RHINITIS 08/10/2007   ANXIETY 12/06/2007   Atherosclerotic peripheral vascular disease (HCC) 06/13/2013   Aorta on CT June 2014   Cellulitis 2011   Cervical disc disease 03/09/2012   CHF (congestive heart failure) (HCC)    Chronic pain 03/09/2012   DEPRESSION 12/06/2007   Diabetes (HCC)    DIVERTICULOSIS, [place] 12/06/2007   GERD 12/06/2007   Hepatitis    age 64 hepatitis A   HLD (hyperlipidemia) 05/24/2019   HYPERTENSION 12/06/2007   Lumbar disc disease 03/09/2012  Mesenteric adenitis    MRSA 2006   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 2010   CERVICAL DISC SURGERY  may 24th 2010   COLONOSCOPY WITH PROPOFOL N/A 07/10/2016   Procedure: COLONOSCOPY WITH PROPOFOL;  Surgeon: Ruffin Frederick, MD;  Location: [facility];  Service: Gastroenterology;  Laterality: N/A;   COLONOSCOPY WITH PROPOFOL N/A 09/26/2019    Procedure: COLONOSCOPY WITH PROPOFOL;  Surgeon: Benancio Deeds, MD;  Location: [facility];  Service: Gastroenterology;  Laterality: N/A;   ESOPHAGOGASTRODUODENOSCOPY (EGD) WITH PROPOFOL N/A 07/10/2016   Procedure: ESOPHAGOGASTRODUODENOSCOPY (EGD) WITH PROPOFOL;  Surgeon: Ruffin Frederick, MD;  Location: [facility];  Service: Gastroenterology;  Laterality: N/A;   POLYPECTOMY  09/26/2019   Procedure: POLYPECTOMY;  Surgeon: Benancio Deeds, MD;  Location: [facility];  Service: Gastroenterology;;   RADIOLOGY WITH ANESTHESIA N/A 07/30/2023   Procedure: MRI WITH ANESTHESIA - THORACIC LUMBAR WITHOUT CONSTRAST;  Surgeon: Radiologist, Medication, MD;  Location: [facility] OR;  Service: Radiology;  Laterality: N/A;   s/p ovary cyst     s/p right knee arthroscopy     Dr. Dion Saucier ortho    Medications:   Current Facility-Administered Medications:    acetaminophen (TYLENOL) tablet 650 mg, 650 mg, Oral, Q4H PRN **OR** acetaminophen (TYLENOL) suppository 650 mg, 650 mg, Rectal, Q6H PRN, Lewie Chamber, MD   bisacodyl (DULCOLAX) EC tablet 5 mg, 5 mg, Oral, Daily PRN, Charlsie Quest, MD   clonazePAM (KLONOPIN) tablet 1 mg, 1 mg, Oral, BID PRN, Darreld Mclean R, MD, 1 mg at 11/29/23 0108   cyanocobalamin (VITAMIN B12) tablet 1,000 mcg, 1,000 mcg, Oral, Daily, Lewie Chamber, MD, 1,000 mcg at 11/29/23 0949   DULoxetine (CYMBALTA) DR capsule 20 mg, 20 mg, Oral, QHS, Girguis, Onalee Hua, MD, 20 mg at 11/28/23 2158   enoxaparin (LOVENOX) injection 60 mg, 60 mg, Subcutaneous, Q24H, Danford Bad, RPH, 60 mg at 11/29/23 0981   Gerhardt's butt cream, , Topical, BID, Lewie Chamber, MD, Given at 11/29/23 0951   guaiFENesin-dextromethorphan (ROBITUSSIN DM) 100-10 MG/5ML syrup 5 mL, 5 mL, Oral, Q4H PRN, Lewie Chamber, MD   insulin aspart (novoLOG) injection 0-9 Units, 0-9 Units, Subcutaneous, TID WC, Darreld Mclean R, MD, 1 Units at 11/29/23 1305   metoprolol succinate (TOPROL-XL) 24 hr tablet 25 mg, 25 mg, Oral,  Daily, Darreld Mclean R, MD, 25 mg at 11/29/23 1914   nystatin cream (MYCOSTATIN), , Topical, BID, Lewie Chamber, MD, Given at 11/29/23 0952   ondansetron (ZOFRAN) tablet 4 mg, 4 mg, Oral, Q6H PRN **OR** ondansetron (ZOFRAN) injection 4 mg, 4 mg, Intravenous, Q6H PRN, Charlsie Quest, MD   Oral care mouth rinse, 15 mL, Mouth Rinse, PRN, Lewie Chamber, MD   oxyCODONE (Oxy IR/ROXICODONE) immediate release tablet 10 mg, 10 mg, Oral, BID, Lewie Chamber, MD, 10 mg at 11/29/23 0949   pantoprazole (PROTONIX) EC tablet 40 mg, 40 mg, Oral, BID, Darreld Mclean R, MD, 40 mg at 11/29/23 0949   phenol (CHLORASEPTIC) mouth spray 1 spray, 1 spray, Mouth/Throat, PRN, Darreld Mclean R, MD, 1 spray at 11/26/23 0307   senna-docusate (Senokot-S) tablet 1 tablet, 1 tablet, Oral, QHS PRN, Darreld Mclean R, MD   spironolactone (ALDACTONE) tablet 12.5 mg, 12.5 mg, Oral, Daily, Darreld Mclean R, MD, 12.5 mg at 11/29/23 [phone]  torsemide (DEMADEX) tablet 60 mg, 60 mg, Oral, BID, Charlsie Quest, MD, 60 mg at 11/29/23 0948  Allergies: Allergies  Allergen Reactions   Amoxicillin-Pot Clavulanate Nausea And Vomiting    Projectile vomiting Did it involve swelling of the face/tongue/throat, SOB, or low BP? No Did it involve sudden or severe rash/hives, skin peeling, or any reaction on the inside of your mouth or nose? No Did you need to seek medical attention at a hospital or doctor's office? No When did it last happen?      20-30 years If all above answers are "NO", may proceed with cephalosporin use.    Adhesive [Tape] Other (See Comments)    Tears skin off - use paper tape    Codeine Other (See Comments)    hallucinations   Crestor [Rosuvastatin Calcium] Other (See Comments)    Severe muscle cramps   Gabapentin Other (See Comments)    Trembling   Lyrica [Pregabalin] Other (See Comments)    weakness   Morphine And Codeine Nausea And Vomiting and Other (See Comments)    Migraine headaches   Vicodin  [Hydrocodone-Acetaminophen] Other (See Comments)    hallucinations   Latex Rash

## 2023-11-29 NOTE — Consult Note (Deleted)
Brief Psychiatry Consult Note  The patient was last seen by the psychiatry service on 12/6. Interim documentation by primary team and nursing staff has been reviewed. At this time, patient is pending dc to SNF and there is no evidence of acute psychiatric disturbance requiring ongoing psychiatric consultation. Please see last consult note for full assessment. Final medication recommendations are as follows:   --  Briefly reviewed chart - agree with stopping dopamine agonists and neurology f/u for isolated VH w/o other signs of psychosis.    We will sign off at this time (was still on list despite signign off 12/6). This has been communicated to the primary team. If issues arise in the future, don't hesitate to reconsult the Psychiatry Inpatient Consult Service.   Samanvi Cuccia A Ignatius Kloos

## 2023-11-29 NOTE — TOC Progression Note (Signed)
Transition of Care [facility]) - Progression Note    Patient Details  Name: Maria [name] MRN: [id number] Date of Birth: 1956-07-13  Transition of Care [facility]) CM/SW Contact  Otelia Santee, LCSW Phone Number: 11/29/2023, 1:18 PM  Clinical Narrative:    Met with pt to inform of co-pay status for SNF. Pt reports she is unable to afford co-pay. Pt agreeable to return home with [facility] HHPT has been arranged with [facility]. HH order will need to be placed prior to discharge. CSW discussed need for LTC placement. Pt agrees she needs LTC however, does not want to be in a nursing facility the rest of her life. CSW provided pt with information for [employer] for assistance in seeking LTC placement.    Expected Discharge Plan: Skilled Nursing Facility Barriers to Discharge: Barriers Resolved  Expected Discharge Plan and Services In-house Referral: Clinical Social Work Discharge Planning Services: NA Post Acute Care Choice: Home Health Living arrangements for the past 2 months: Apartment                 DME Arranged: N/A DME Agency: NA       HH Arranged: PT HH AgencyHotel manager Home Health Care Date HH Agency Contacted: 11/29/23 Time HH Agency Contacted: [id number] Representative spoke with at [facility] Agency: Cindie   Social Determinants of Health (SDOH) Interventions SDOH Screenings   Food Insecurity: No Food Insecurity (11/25/2023)  Recent Concern: Food Insecurity - Food Insecurity Present (09/06/2023)  Housing: Low Risk  (11/25/2023)  Transportation Needs: Unmet Transportation Needs (11/25/2023)  Utilities: Not At Risk (11/25/2023)  Alcohol Screen: Low Risk  (12/30/2021)  Depression (PHQ2-9): Medium Risk (01/30/2022)  Financial Resource Strain: Medium Risk (12/30/2021)  Physical Activity: Inactive (12/30/2021)  Social Connections: Unknown (09/17/2022)   Received from [facility], [facility]  Stress: No Stress Concern Present (12/30/2021)  Tobacco Use: Medium Risk (11/26/2023)     Readmission Risk Interventions    11/29/2023    1:16 PM 09/06/2023    1:10 PM 07/22/2022    2:59 PM  Readmission Risk Prevention Plan  Transportation Screening Complete Complete Complete  PCP or Specialist Appt within 3-5 Days  Complete Complete  HRI or Home Care Consult  Complete Complete  Social Work Consult for Recovery Care Planning/Counseling  Complete Complete  Palliative Care Screening  Not Applicable Complete  Medication Review Oceanographer) Complete Complete Complete  PCP or Specialist appointment within 3-5 days of discharge Complete    HRI or Home Care Consult Complete    SW Recovery Care/Counseling Consult Complete    Palliative Care Screening Not Applicable    Skilled Nursing Facility Patient Refused

## 2023-11-29 NOTE — Plan of Care (Signed)
Problem: Education: Goal: Ability to describe self-care measures that may prevent or decrease complications (Diabetes Survival Skills Education) will improve 11/29/2023 1800 by Deneen Harts, RN Outcome: Progressing 11/29/2023 1800 by Deneen Harts, RN Outcome: Progressing Goal: Individualized Educational Video(s) 11/29/2023 1800 by Deneen Harts, RN Outcome: Progressing 11/29/2023 1800 by Deneen Harts, RN Outcome: Progressing   Problem: Coping: Goal: Ability to adjust to condition or change in health will improve 11/29/2023 1800 by Deneen Harts, RN Outcome: Progressing 11/29/2023 1800 by Deneen Harts, RN Outcome: Progressing   Problem: Fluid Volume: Goal: Ability to maintain a balanced intake and output will improve 11/29/2023 1800 by Deneen Harts, RN Outcome: Progressing 11/29/2023 1800 by Deneen Harts, RN Outcome: Progressing   Problem: Health Behavior/Discharge Planning: Goal: Ability to identify and utilize available resources and services will improve 11/29/2023 1800 by Deneen Harts, RN Outcome: Progressing 11/29/2023 1800 by Deneen Harts, RN Outcome: Progressing Goal: Ability to manage health-related needs will improve 11/29/2023 1800 by Deneen Harts, RN Outcome: Progressing 11/29/2023 1800 by Deneen Harts, RN Outcome: Progressing   Problem: Metabolic: Goal: Ability to maintain appropriate glucose levels will improve 11/29/2023 1800 by Deneen Harts, RN Outcome: Progressing 11/29/2023 1800 by Deneen Harts, RN Outcome: Progressing   Problem: Nutritional: Goal: Maintenance of adequate nutrition will improve 11/29/2023 1800 by Deneen Harts, RN Outcome: Progressing 11/29/2023 1800 by Deneen Harts, RN Outcome: Progressing Goal: Progress toward achieving an optimal weight will improve 11/29/2023 1800 by Deneen Harts, RN Outcome: Progressing 11/29/2023 1800 by  Deneen Harts, RN Outcome: Progressing   Problem: Skin Integrity: Goal: Risk for impaired skin integrity will decrease 11/29/2023 1800 by Deneen Harts, RN Outcome: Progressing 11/29/2023 1800 by Deneen Harts, RN Outcome: Progressing   Problem: Tissue Perfusion: Goal: Adequacy of tissue perfusion will improve 11/29/2023 1800 by Deneen Harts, RN Outcome: Progressing 11/29/2023 1800 by Deneen Harts, RN Outcome: Progressing   Problem: Education: Goal: Knowledge of General Education information will improve Description: Including pain rating scale, medication(s)/side effects and non-pharmacologic comfort measures 11/29/2023 1800 by Deneen Harts, RN Outcome: Progressing 11/29/2023 1800 by Deneen Harts, RN Outcome: Progressing   Problem: Health Behavior/Discharge Planning: Goal: Ability to manage health-related needs will improve 11/29/2023 1800 by Deneen Harts, RN Outcome: Progressing 11/29/2023 1800 by Deneen Harts, RN Outcome: Progressing   Problem: Clinical Measurements: Goal: Ability to maintain clinical measurements within normal limits will improve 11/29/2023 1800 by Deneen Harts, RN Outcome: Progressing 11/29/2023 1800 by Deneen Harts, RN Outcome: Progressing Goal: Will remain free from infection 11/29/2023 1800 by Deneen Harts, RN Outcome: Progressing 11/29/2023 1800 by Deneen Harts, RN Outcome: Progressing Goal: Diagnostic test results will improve 11/29/2023 1800 by Deneen Harts, RN Outcome: Progressing 11/29/2023 1800 by Deneen Harts, RN Outcome: Progressing Goal: Respiratory complications will improve 11/29/2023 1800 by Deneen Harts, RN Outcome: Progressing 11/29/2023 1800 by Deneen Harts, RN Outcome: Progressing Goal: Cardiovascular complication will be avoided 11/29/2023 1800 by Deneen Harts, RN Outcome: Progressing 11/29/2023 1800 by Deneen Harts, RN Outcome: Progressing   Problem: Activity: Goal: Risk for activity intolerance will decrease 11/29/2023 1800 by Deneen Harts, RN Outcome: Progressing 11/29/2023 1800 by Deneen Harts, RN Outcome: Progressing   Problem: Nutrition: Goal: Adequate nutrition will be maintained 11/29/2023 1800 by Deneen Harts, RN Outcome: Progressing 11/29/2023 1800 by Deneen Harts, RN Outcome: Progressing  Problem: Coping: Goal: Level of anxiety will decrease 11/29/2023 1800 by Deneen Harts, RN Outcome: Progressing 11/29/2023 1800 by Deneen Harts, RN Outcome: Progressing   Problem: Elimination: Goal: Will not experience complications related to bowel motility 11/29/2023 1800 by Deneen Harts, RN Outcome: Progressing 11/29/2023 1800 by Deneen Harts, RN Outcome: Progressing Goal: Will not experience complications related to urinary retention 11/29/2023 1800 by Deneen Harts, RN Outcome: Progressing 11/29/2023 1800 by Deneen Harts, RN Outcome: Progressing   Problem: Pain Management: Goal: General experience of comfort will improve 11/29/2023 1800 by Deneen Harts, RN Outcome: Progressing 11/29/2023 1800 by Deneen Harts, RN Outcome: Progressing   Problem: Safety: Goal: Ability to remain free from injury will improve 11/29/2023 1800 by Deneen Harts, RN Outcome: Progressing 11/29/2023 1800 by Deneen Harts, RN Outcome: Progressing   Problem: Skin Integrity: Goal: Risk for impaired skin integrity will decrease 11/29/2023 1800 by Deneen Harts, RN Outcome: Progressing 11/29/2023 1800 by Deneen Harts, RN Outcome: Progressing

## 2023-11-29 NOTE — Progress Notes (Signed)
Physical Therapy Treatment Patient Details Name: Maria [name] MRN: [id number] DOB: Mar 26, 1956 Today's Date: 11/29/2023   History of Present Illness Pt is a 67 y.o. femlae presenting to ED with generalized weakness and reports of intermittent hallucinations on 11/26/23. Recently admitted to [facility] from 9/9 until 9/16 for generalized weakness and worsening bilateral leg pain.PMH significant for chronic kidney disease stage IIIa, lymphedema, hypertension, gastroesophageal reflux disease, obstructive sleep apnea, diastolic congestive heart failure (Echo 01/2023 EF 65-70%.    PT Comments  Pt agreeable to working with therapy. She reports she feels better today. She participated well with therapy. Will continue to progress activity as tolerated. Patient will benefit from continued inpatient follow up therapy, <3 hours/day     If plan is discharge home, recommend the following: A little help with walking and/or transfers;A little help with bathing/dressing/bathroom;Assistance with cooking/housework;Assist for transportation;Help with stairs or ramp for entrance   Can travel by private vehicle     Yes  Equipment Recommendations  None recommended by PT    Recommendations for Other Services       Precautions / Restrictions Precautions Precautions: Fall Restrictions Weight Bearing Restrictions: No     Mobility  Bed Mobility Overal bed mobility: Needs Assistance Bed Mobility: Supine to Sit     Supine to sit: Contact guard, HOB elevated     General bed mobility comments: Increased time. No physical assistance provided but pt relied quite a bit on bed rails and HOB elevation    Transfers Overall transfer level: Needs assistance Equipment used: Rolling walker (2 wheels) Transfers: Sit to/from Stand Sit to Stand: Min assist, From elevated surface           General transfer comment: Assist to power up, stabilize. Pt able to recall proper technique for sit to stand  transitions-no cueing required.    Ambulation/Gait Ambulation/Gait assistance: Min assist Gait Distance (Feet): 12 Feet Assistive device: Rolling walker (2 wheels) Gait Pattern/deviations: Step-through pattern, Decreased stride length       General Gait Details: Pt walked short distance around room. Tolerated well. Some mild dizziness. No LOB with RW use.   Stairs             Wheelchair Mobility     Tilt Bed    Modified Rankin (Stroke Patients Only)       Balance Overall balance assessment: Needs assistance, History of Falls         Standing balance support: Bilateral upper extremity supported, Reliant on assistive device for balance Standing balance-Leahy Scale: Poor                              Cognition Arousal: Alert Behavior During Therapy: WFL for tasks assessed/performed Overall Cognitive Status: No family/caregiver present to determine baseline cognitive functioning                                 General Comments: Oriented x 4 (aware she was hallucinating); followed commands well        Exercises General Exercises - Lower Extremity Ankle Circles/Pumps: AROM, Both, 20 reps    General Comments        Pertinent Vitals/Pain Pain Assessment Pain Assessment: Faces Faces Pain Scale: Hurts a little bit Pain Location: L shoulder, L LE Pain Descriptors / Indicators: Discomfort, Aching Pain Intervention(s): Limited activity within patient's tolerance, Monitored during session, Repositioned  Home Living                          Prior Function            PT Goals (current goals can now be found in the care plan section) Progress towards PT goals: Progressing toward goals    Frequency    Min 1X/week      PT Plan      Co-evaluation              AM-PAC PT "6 Clicks" Mobility   Outcome Measure  Help needed turning from your back to your side while in a flat bed without using bedrails?: A  Little Help needed moving from lying on your back to sitting on the side of a flat bed without using bedrails?: A Lot Help needed moving to and from a bed to a chair (including a wheelchair)?: A Little Help needed standing up from a chair using your arms (e.g., wheelchair or bedside chair)?: A Little Help needed to walk in hospital room?: A Lot Help needed climbing 3-5 steps with a railing? : Total 6 Click Score: 14    End of Session Equipment Utilized During Treatment: Gait belt Activity Tolerance: Patient tolerated treatment well Patient left: in chair;with call bell/phone within reach;with chair alarm set   PT Visit Diagnosis: Muscle weakness (generalized) (M62.81);Difficulty in walking, not elsewhere classified (R26.2)     Time: 1478-2956 PT Time Calculation (min) (ACUTE ONLY): 25 min  Charges:    $Gait Training: 8-22 mins $Therapeutic Activity: 8-22 mins PT General Charges $$ ACUTE PT VISIT: 1 Visit                         Faye Ramsay, PT Acute Rehabilitation  Office: [phone]

## 2023-11-30 DIAGNOSIS — G9341 Metabolic encephalopathy: Secondary | ICD-10-CM | POA: Diagnosis not present

## 2023-11-30 DIAGNOSIS — R5381 Other malaise: Secondary | ICD-10-CM | POA: Diagnosis not present

## 2023-11-30 DIAGNOSIS — R443 Hallucinations, unspecified: Secondary | ICD-10-CM | POA: Diagnosis not present

## 2023-11-30 LAB — HIV-1/2 AB - DIFFERENTIATION
HIV 1 Ab: NONREACTIVE
HIV 1 Ab: NONREACTIVE
HIV 2 Ab: NONREACTIVE
HIV 2 Ab: NONREACTIVE
Note: NEGATIVE
Note: NEGATIVE

## 2023-11-30 LAB — GLUCOSE, CAPILLARY
Glucose-Capillary: 102 mg/dL — ABNORMAL HIGH (ref 70–99)
Glucose-Capillary: 117 mg/dL — ABNORMAL HIGH (ref 70–99)

## 2023-11-30 LAB — HIV-1/HIV-2 QUALITATIVE RNA
Final Interpretation: NEGATIVE
Final Interpretation: NEGATIVE
HIV-1 RNA, Qualitative: NONREACTIVE
HIV-1 RNA, Qualitative: NONREACTIVE
HIV-2 RNA, Qualitative: NONREACTIVE
HIV-2 RNA, Qualitative: NONREACTIVE

## 2023-11-30 LAB — VITAMIN B1: Vitamin B1 (Thiamine): 69.3 nmol/L (ref 66.5–200.0)

## 2023-11-30 MED ORDER — OXYCODONE HCL 10 MG PO TABS: 10.0000 mg | ORAL_TABLET | Freq: Two times a day (BID) | ORAL | Status: DC

## 2023-11-30 MED ORDER — TORSEMIDE 20 MG PO TABS: 40.0000 mg | ORAL_TABLET | Freq: Every day | ORAL | Status: DC

## 2023-11-30 NOTE — TOC Transition Note (Signed)
Transition of Care [facility]) - CM/SW Discharge Note   Patient Details  Name: Maria [name] MRN: [id number] Date of Birth: 1956-05-02  Transition of Care [facility]) CM/SW Contact:  Otelia Santee, LCSW Phone Number: 11/30/2023, 10:28 AM   Clinical Narrative:    Pt to return home with home health PT/SW through [facility]. Pt agreeable to referral being made to [employer] for LTC placement. Referral faxed in to [phone]. Pt to be transported home via [facility].   Final next level of care: Home w Home Health Services Barriers to Discharge: Barriers Resolved   Patient Goals and CMS Choice CMS Medicare.gov Compare Post Acute Care list provided to:: Patient Choice offered to / list presented to : Patient  Discharge Placement                         Discharge Plan and Services Additional resources added to the After Visit Summary for   In-house Referral: Clinical Social Work Discharge Planning Services: NA Post Acute Care Choice: Home Health          DME Arranged: N/A DME Agency: NA       HH Arranged: PT HH Agency: [facility] Date [facility] Agency Contacted: 11/29/23 Time HH Agency Contacted: 1318 Representative spoke with at [facility] Agency: Cindie  Social Determinants of Health (SDOH) Interventions SDOH Screenings   Food Insecurity: No Food Insecurity (11/25/2023)  Recent Concern: Food Insecurity - Food Insecurity Present (09/06/2023)  Housing: Low Risk  (11/25/2023)  Transportation Needs: Unmet Transportation Needs (11/25/2023)  Utilities: Not At Risk (11/25/2023)  Alcohol Screen: Low Risk  (12/30/2021)  Depression (PHQ2-9): Medium Risk (01/30/2022)  Financial Resource Strain: Medium Risk (12/30/2021)  Physical Activity: Inactive (12/30/2021)  Social Connections: Unknown (09/17/2022)   Received from [facility]  Stress: No Stress Concern Present (12/30/2021)  Tobacco Use: Medium Risk (11/26/2023)     Readmission Risk Interventions     11/29/2023    1:16 PM 09/06/2023    1:10 PM 07/22/2022    2:59 PM  Readmission Risk Prevention Plan  Transportation Screening Complete Complete Complete  PCP or Specialist Appt within 3-5 Days  Complete Complete  HRI or Home Care Consult  Complete Complete  Social Work Consult for Recovery Care Planning/Counseling  Complete Complete  Palliative Care Screening  Not Applicable Complete  Medication Review Oceanographer) Complete Complete Complete  PCP or Specialist appointment within 3-5 days of discharge Complete    HRI or Home Care Consult Complete    SW Recovery Care/Counseling Consult Complete    Palliative Care Screening Not Applicable    Skilled Nursing Facility Patient Refused

## 2023-11-30 NOTE — Plan of Care (Signed)
  Problem: Education: Goal: Ability to describe self-care measures that may prevent or decrease complications (Diabetes Survival Skills Education) will improve Outcome: Progressing Goal: Individualized Educational Video(s) Outcome: Progressing   Problem: Coping: Goal: Ability to adjust to condition or change in health will improve Outcome: Progressing   Problem: Fluid Volume: Goal: Ability to maintain a balanced intake and output will improve Outcome: Progressing   Problem: Health Behavior/Discharge Planning: Goal: Ability to identify and utilize available resources and services will improve Outcome: Progressing Goal: Ability to manage health-related needs will improve Outcome: Progressing   Problem: Metabolic: Goal: Ability to maintain appropriate glucose levels will improve Outcome: Progressing   Problem: Nutritional: Goal: Maintenance of adequate nutrition will improve Outcome: Progressing Goal: Progress toward achieving an optimal weight will improve Outcome: Progressing   Problem: Skin Integrity: Goal: Risk for impaired skin integrity will decrease Outcome: Progressing   Problem: Tissue Perfusion: Goal: Adequacy of tissue perfusion will improve Outcome: Progressing   Problem: Education: Goal: Knowledge of General Education information will improve Description: Including pain rating scale, medication(s)/side effects and non-pharmacologic comfort measures Outcome: Progressing   Problem: Health Behavior/Discharge Planning: Goal: Ability to manage health-related needs will improve Outcome: Progressing   Problem: Activity: Goal: Risk for activity intolerance will decrease Outcome: Progressing   Problem: Nutrition: Goal: Adequate nutrition will be maintained Outcome: Progressing   Problem: Coping: Goal: Level of anxiety will decrease Outcome: Progressing   Problem: Elimination: Goal: Will not experience complications related to bowel motility Outcome:  Progressing Goal: Will not experience complications related to urinary retention Outcome: Progressing   Problem: Pain Management: Goal: General experience of comfort will improve Outcome: Progressing   Problem: Safety: Goal: Ability to remain free from injury will improve Outcome: Progressing   Problem: Skin Integrity: Goal: Risk for impaired skin integrity will decrease Outcome: Progressing

## 2023-11-30 NOTE — Plan of Care (Signed)
  Problem: Coping: Goal: Level of anxiety will decrease 11/30/2023 1054 by Candyce Churn, RN Outcome: Adequate for Discharge 11/30/2023 1053 by Candyce Churn, RN Outcome: Progressing   Problem: Elimination: Goal: Will not experience complications related to bowel motility 11/30/2023 1054 by Candyce Churn, RN Outcome: Adequate for Discharge 11/30/2023 1053 by Candyce Churn, RN Outcome: Progressing Goal: Will not experience complications related to urinary retention 11/30/2023 1054 by Candyce Churn, RN Outcome: Adequate for Discharge 11/30/2023 1053 by Candyce Churn, RN Outcome: Progressing   Problem: Pain Management: Goal: General experience of comfort will improve 11/30/2023 1054 by Candyce Churn, RN Outcome: Adequate for Discharge 11/30/2023 1053 by Candyce Churn, RN Outcome: Progressing   Problem: Safety: Goal: Ability to remain free from injury will improve 11/30/2023 1054 by Candyce Churn, RN Outcome: Adequate for Discharge 11/30/2023 1053 by Candyce Churn, RN Outcome: Progressing   Problem: Skin Integrity: Goal: Risk for impaired skin integrity will decrease 11/30/2023 1054 by Candyce Churn, RN Outcome: Adequate for Discharge 11/30/2023 1053 by Candyce Churn, RN Outcome: Progressing

## 2023-11-30 NOTE — Progress Notes (Signed)
Occupational Therapy Treatment Patient Details Name: Maria [name] MRN: [id number] DOB: 10-05-1956 Today's Date: 11/30/2023   History of present illness Pt is a 66 yr old female presenting to ED with generalized weakness and reports of intermittent hallucinations on 11/26/23. Recently admitted to [facility] from 9/9 until 9/16 for generalized weakness and worsening bilateral leg pain. PMH significant for chronic kidney disease stage IIIa, lymphedema, hypertension, gastroesophageal reflux disease, obstructive sleep apnea, diastolic congestive heart failure (Echo 01/2023 EF 65-70%.   OT comments  The pt was motivated to participate in the therapy session. She progressed to ambulating to and from the bathroom in her room using a RW, as well as performing grooming in standing at sink level. She required occasional redirection throughout the session, as she appeared to intermittently still have hallucinations regarding past events; she expressed recognition of this, and would inquire about if such thoughts were reality or not. Overall, she is making gradual functional progress and will continue to benefit from OT services.  She stated she will be returning home this date with home therapy. OT continues to recommend short-term SNF rehab. If she returns home at discharge, she would benefit from increased supervision if available, especially due to her waxing and waning cognition.       If plan is discharge home, recommend the following:  Direct supervision/assist for medications management;Help with stairs or ramp for entrance;Assistance with cooking/housework;Assist for transportation;A little help with bathing/dressing/bathroom   Equipment Recommendations  None recommended by OT    Recommendations for Other Services      Precautions / Restrictions Precautions Precautions: Fall Restrictions Weight Bearing Restrictions: No       Mobility Bed Mobility      General bed mobility comments: pt  was received seated in the bedside chair    Transfers Overall transfer level: Needs assistance Equipment used: Rolling walker (2 wheels) Transfers: Sit to/from Stand Sit to Stand: Contact guard assist           General transfer comment: She required min verbal cues for scooting out to the edge of the chair and pushing up from chair with at least 1 upper extremity, prior to attempting standing         ADL either performed or assessed with clinical judgement   ADL Overall ADL's : Needs assistance/impaired     Grooming: Contact guard assist;Standing Grooming Details (indicate cue type and reason): The pt performed face washing and teeth brushing in standing at the sink. She required intermittent 1 upper extremity support of the RW for balance. She also required 1 verbal cue to step closer to the sink while performing tasks for added safety and decreased falls risk.          Vision Baseline Vision/History: 1 Wears glasses            Cognition Arousal: Alert Behavior During Therapy: WFL for tasks assessed/performed Overall Cognitive Status: No family/caregiver present to determine baseline cognitive functioning          General Comments: Ablel to follow 1 step commands consistently, friendly, pt reports concerns about current reality vs. whether or not she is still having intermittent hallucinations                   Pertinent Vitals/ Pain       Pain Assessment Pain Assessment: Faces Pain Score: 5  Pain Location: LUE Pain Descriptors / Indicators: Discomfort, Aching Pain Intervention(s): Limited activity within patient's tolerance, Monitored during session  Frequency  Min 1X/week        Progress Toward Goals  OT Goals(current goals can now be found in the care plan section)  Progress towards OT goals: Progressing toward goals  Acute Rehab OT Goals OT Goal Formulation: With patient Time For Goal Achievement: 12/10/23 Potential to Achieve  Goals: Good  Plan      AM-PAC OT "6 Clicks" Daily Activity     Outcome Measure   Help from another person eating meals?: None Help from another person taking care of personal grooming?: A Little Help from another person toileting, which includes using toliet, bedpan, or urinal?: A Little Help from another person bathing (including washing, rinsing, drying)?: A Lot Help from another person to put on and taking off regular upper body clothing?: A Little Help from another person to put on and taking off regular lower body clothing?: A Little 6 Click Score: 18    End of Session Equipment Utilized During Treatment: Rolling walker (2 wheels)  OT Visit Diagnosis: Muscle weakness (generalized) (M62.81);Unsteadiness on feet (R26.81)   Activity Tolerance Patient tolerated treatment well   Patient Left in chair;with call bell/phone within reach;with chair alarm set   Nurse Communication Mobility status        Time: 2130-8657 OT Time Calculation (min): 26 min  Charges: OT General Charges $OT Visit: 1 Visit OT Treatments $Self Care/Home Management : 8-22 mins $Therapeutic Activity: 8-22 mins    Reuben Likes, OTR/L 11/30/2023, 2:07 PM

## 2023-11-30 NOTE — Discharge Summary (Signed)
Physician Discharge Summary   Maria [name] YQI:[id number] DOB: January 01, 1956 DOA: 11/25/2023  PCP: Ignatius Specking, NP  Admit date: 11/25/2023 Discharge date: 11/30/2023   Admitted From: Home Disposition: Home with home health Discharging physician: Lewie Chamber, MD Barriers to discharge: Unable to find placement  Recommendations at discharge: Follow-up with neurology for history of hallucinations. May need to consider long-term care placement if unable to care for self at home  Discharge Condition: stable CODE STATUS: Full Diet recommendation:  Diet Orders (From admission, onward)     Start     Ordered   11/30/23 0000  Diet Carb Modified        11/30/23 0930   11/25/23 2207  Diet heart healthy/carb modified Room service appropriate? Yes; Fluid consistency: Thin  Diet effective now       Question Answer Comment  Diet-HS Snack? Nothing   Room service appropriate? Yes   Fluid consistency: Thin      11/25/23 2206            Hospital Course: Maria [name] is a 67 y.o. female with medical history significant for chronic HFpEF (EF 65-70%), CKD stage IIIa, T2DM, HTN, GERD, lymphedema, depression/anxiety, chronic pain syndrome, OSA not using CPAP who is admitted after being found down on the ground at home. She continues to live independently although prior hospitalizations she has been recommended for SNF and even consideration of long-term care but due to financial constraints, has not been able to transition to this.  She has also had prior issues with visual and auditory hallucinations but mostly visual.  She has been evaluated by psychiatry previously as well but the main recommendation has been to limit mind altering and sedating medications.  She has continued to remain on Flexeril, Cymbalta, Klonopin, Mirapex. Patient does not remember many of the details prior to hospitalization other than falling in her house and knocking over the microwave but then somehow ended up in the  living room where she was discovered.  She was discovered after a welfare check was called. On workup she was again found to have potential UTI however she chronically has presumed colonized urine specimens.  She was started on antibiotics and admitted for further workup.  She completed empiric Rocephin course although UTI suspicion remained low and there was insignificant growth on urine culture. Blood cultures were considered contaminated with polymicrobial growth. She was evaluated by neurology and psychiatry due to her ongoing hallucinations.  She was tapered off of Mirapex as this was considered to be a possible culprit of her hallucinations.  She also underwent MRI brain which was unremarkable.  Hallucinations did essentially resolve with Mirapex tapering and she had gone 48 hours without hallucinations at time of discharge.  She was referred to neurology at discharge for ongoing evaluation.  Assessment and Plan: * Acute metabolic encephalopathy-resolved as of 11/30/2023 - recurrent issue as with prior hospitalizations; again treating potential infectious etiology but UA again not too impressive from infection standpoint, plus she has continued to have hallucinations when in her normal state of health - will ask for repeat psychiatry evaluation in case of need for further workup; greatly appreciate. We will plan on getting patient outpt neuro referral as well - her pramipexole has been tapered off per psychiatry - Treating UA with short course rocephin but she has known hx of ESBL Kleb  -She was fully alert and oriented at time of discharge  Hallucinations - patient states that these started in September 2024. She denies  prior history of them. Since Sept they appear to be worsening and affecting her lifestyle dramatically - Patient continues to have visual and auditory hallucinations even when feeling normal - Seen by psychiatry last hospitalization.  Again evaluated this hospitalization by  psychiatry, appreciate assistance; Patient being tapered off of pramipexole - attempting for outpatient referral to neurology for further evaluation; she was also evaluated during hospitalization by neurology.  No other medication change recommendations.  Outpatient referral still recommended -MRI brain obtained on 11/28/2023 which was negative for acute abnormalities -Patient was hallucination free for over 48 hours at time of discharge  Physical deconditioning - Previously recommended for SNF by PT/OT but patient has been unable to afford -She is also likely in need of long-term care at this rate.  She continues to reconsider this each hospitalization -For now, we will again plan on discharging home with home health after discussions with patient  Chronic diastolic CHF (congestive heart failure) (HCC) - no s/s exacerbation  -Continue Toprol, Aldactone, torsemide  Lymphedema - Chronic and at baseline of bilateral lower extremities  Urinary tract infection-resolved as of 11/28/2023 - low suspicion for real infection especially with hx of ESBL UTIs but for now planning on 3 doses rocephin; completed 4 doses  Stasis dermatitis of both legs - Stable and baseline.  No signs of cellulitis  Type 2 diabetes mellitus with stage 3a chronic kidney disease, without long-term current use of insulin (HCC) - Resume home regimen  OSA (obstructive sleep apnea) - Not compliant on CPAP use at home  Restless legs syndrome - Appreciate psychiatry evaluation.  See hallucinations as well.  Patient tapered off of pramipexole  GERD without esophagitis - Continue PPI  Morbid obesity with body mass index (BMI) of 50.0 to 59.9 in adult [facility]) - Complicates overall prognosis and care - Body mass index is 59.96 kg/m.  Chronic low back pain - Prescription on database for oxycodone appears to be expired.  Last fill on 07/10/2023 for a 30-day supply.  Since then she has been following with pain management who  has been prescribing oxycodone  HTN (hypertension) - Continue metoprolol, spironolactone, torsemide  Depression - Continue Cymbalta  Anxiety state - Chronically on Klonopin - Verified on database       The patient's acute and chronic medical conditions were treated accordingly. On day of discharge, patient was felt deemed stable for discharge. Patient/family member advised to call PCP or come back to ER if needed.   Principal Diagnosis: Acute metabolic encephalopathy  Discharge Diagnoses: Active Hospital Problems   Diagnosis Date Noted   Hallucinations 09/02/2023    Priority: 2.   Physical deconditioning 07/22/2022    Priority: 3.   Chronic diastolic CHF (congestive heart failure) (HCC) 03/17/2021    Priority: 3.   Lymphedema 01/25/2018    Priority: 3.   Pressure injury of skin 11/26/2023   Stasis dermatitis of both legs 08/30/2023   Type 2 diabetes mellitus with stage 3a chronic kidney disease, without long-term current use of insulin (HCC) 03/11/2021   OSA (obstructive sleep apnea) 03/21/2018   Restless legs syndrome 10/08/2015   GERD without esophagitis 10/17/2014   Morbid obesity with body mass index (BMI) of 50.0 to 59.9 in adult [facility]) 06/17/2011   Chronic low back pain 12/28/2008   HTN (hypertension) 12/06/2007   Depression 12/06/2007   Anxiety state 12/06/2007    Resolved Hospital Problems   Diagnosis Date Noted Date Resolved   Acute metabolic encephalopathy 11/25/2023 11/30/2023    Priority: 1.  Urinary tract infection 07/22/2022 11/28/2023    Priority: 3.     Discharge Instructions     Ambulatory referral to Neurology   Complete by: As directed    An appointment is requested in approximately: 4 weeks   Diet Carb Modified   Complete by: As directed    Increase activity slowly   Complete by: As directed    No wound care   Complete by: As directed       Allergies as of 11/30/2023       Reactions   Amoxicillin-pot Clavulanate Nausea And  Vomiting   Projectile vomiting Did it involve swelling of the face/tongue/throat, SOB, or low BP? No Did it involve sudden or severe rash/hives, skin peeling, or any reaction on the inside of your mouth or nose? No Did you need to seek medical attention at a hospital or doctor's office? No When did it last happen?      20-30 years If all above answers are "NO", may proceed with cephalosporin use.   Adhesive [tape] Other (See Comments)   Tears skin off - use paper tape    Codeine Other (See Comments)   hallucinations   Crestor [rosuvastatin Calcium] Other (See Comments)   Severe muscle cramps   Gabapentin Other (See Comments)   Trembling   Lyrica [pregabalin] Other (See Comments)   weakness   Morphine And Codeine Nausea And Vomiting, Other (See Comments)   Migraine headaches   Vicodin [hydrocodone-acetaminophen] Other (See Comments)   hallucinations   Latex Rash        Medication List     STOP taking these medications    diclofenac 50 MG tablet Commonly known as: CATAFLAM   ibuprofen 800 MG tablet Commonly known as: ADVIL   pramipexole 0.5 MG tablet Commonly known as: MIRAPEX       TAKE these medications    Accu-Chek Guide test strip Generic drug: glucose blood Use as instructed up to 4 times daily   Accu-Chek Guide w/Device Kit Use as directed What changed:  how much to take how to take this when to take this additional instructions   acetaminophen 650 MG CR tablet Commonly known as: TYLENOL Take 1,300 mg by mouth every 8 (eight) hours as needed for pain.   albuterol 108 (90 Base) MCG/ACT inhaler Commonly known as: VENTOLIN HFA Inhale 1 puff into the lungs every 6 (six) hours as needed for wheezing or shortness of breath.   cholecalciferol 25 MCG (1000 UNIT) tablet Commonly known as: VITAMIN D3 Take 1,000 Units by mouth daily.   clonazePAM 1 MG tablet Commonly known as: KLONOPIN Take 1 tablet (1 mg total) by mouth 2 (two) times daily as needed.  for anxiety What changed:  reasons to take this additional instructions   cyanocobalamin 1000 MCG tablet Commonly known as: VITAMIN B12 Take 1 tablet (1,000 mcg total) by mouth daily.   cyclobenzaprine 7.5 MG tablet Commonly known as: FEXMID Take 7.5 mg by mouth 3 (three) times daily as needed for muscle spasms.   DULoxetine 20 MG capsule Commonly known as: CYMBALTA Take 20 mg by mouth at bedtime.   famotidine 40 MG tablet Commonly known as: PEPCID Take 40 mg by mouth daily.   fluticasone 50 MCG/ACT nasal spray Commonly known as: FLONASE Place 1 spray into both nostrils daily.   Metamucil Fiber Chew Chew 3 tablets by mouth at bedtime.   metoprolol succinate 25 MG 24 hr tablet Commonly known as: TOPROL-XL Take 1 tablet (25 mg total)  by mouth daily.   Mounjaro 15 MG/0.5ML Pen Generic drug: tirzepatide Inject 15 mg into the skin once a week. What changed: Another medication with the same name was removed. Continue taking this medication, and follow the directions you see here.   Narcan 4 MG/0.1ML Liqd nasal spray kit Generic drug: naloxone Place 0.4 mg into the nose daily as needed (opioid overdose).   ondansetron 4 MG disintegrating tablet Commonly known as: ZOFRAN-ODT Take 4 mg by mouth as needed for nausea or vomiting.   Oxycodone HCl 10 MG Tabs Take 1 tablet (10 mg total) by mouth in the morning and at bedtime.   pantoprazole 40 MG tablet Commonly known as: PROTONIX TAKE ONE TABLET BY MOUTH DAILY   Potassium Chloride ER 20 MEQ Tbcr Take 40 mEq by mouth in the morning and at bedtime.   spironolactone 25 MG tablet Commonly known as: ALDACTONE Take 0.5 tablets (12.5 mg total) by mouth daily. What changed: how much to take   torsemide 20 MG tablet Commonly known as: DEMADEX Take 2 tablets (40 mg total) by mouth daily.        Follow-up Information     [facility] Follow up.   Specialty: Home Health Services Why: Frances Furbish will follow up  with you at discharge to provide home health services. Contact information: [address] [place] [address] [phone]                Allergies  Allergen Reactions   Amoxicillin-Pot Clavulanate Nausea And Vomiting    Projectile vomiting Did it involve swelling of the face/tongue/throat, SOB, or low BP? No Did it involve sudden or severe rash/hives, skin peeling, or any reaction on the inside of your mouth or nose? No Did you need to seek medical attention at a hospital or doctor's office? No When did it last happen?      20-30 years If all above answers are "NO", may proceed with cephalosporin use.    Adhesive [Tape] Other (See Comments)    Tears skin off - use paper tape    Codeine Other (See Comments)    hallucinations   Crestor [Rosuvastatin Calcium] Other (See Comments)    Severe muscle cramps   Gabapentin Other (See Comments)    Trembling   Lyrica [Pregabalin] Other (See Comments)    weakness   Morphine And Codeine Nausea And Vomiting and Other (See Comments)    Migraine headaches   Vicodin [Hydrocodone-Acetaminophen] Other (See Comments)    hallucinations   Latex Rash    Consultations: Neurology Psychiatry  Procedures:   Discharge Exam: BP (!) 117/52 (BP Location: Right Arm)   Pulse 70   Temp 98 F (36.7 C) (Oral)   Resp 18   Ht 5\' 3"  (1.6 m)   Wt (!) 139.7 kg   SpO2 96%   BMI 54.56 kg/m  Physical Exam Constitutional:      Appearance: She is obese.  HENT:     Head: Normocephalic and atraumatic.     Mouth/Throat:     Mouth: Mucous membranes are moist.  Eyes:     Extraocular Movements: Extraocular movements intact.  Cardiovascular:     Rate and Rhythm: Normal rate and regular rhythm.  Pulmonary:     Effort: Pulmonary effort is normal. No respiratory distress.     Breath sounds: Normal breath sounds. No wheezing.  Abdominal:     General: Bowel sounds are normal. There is no distension.     Palpations: Abdomen  is soft.      Tenderness: There is no abdominal tenderness.  Musculoskeletal:        General: Swelling present.     Cervical back: Normal range of motion and neck supple.     Comments: Chronic bilateral lower extremity stasis dermatitis noted along with chronic lower extremity lymphedema  Skin:    General: Skin is warm and dry.  Neurological:     General: No focal deficit present.     Mental Status: She is alert.  Psychiatric:        Mood and Affect: Mood and affect normal. Mood is not anxious.        Behavior: Behavior is cooperative.        Thought Content: Thought content is not paranoid.      The results of significant diagnostics from this hospitalization (including imaging, microbiology, ancillary and laboratory) are listed below for reference.   Microbiology: Recent Results (from the past 240 hour(s))  Urine Culture     Status: Abnormal   Collection Time: 11/25/23  6:33 PM   Specimen: Urine, Random  Result Value Ref Range Status   Specimen Description   Final    URINE, RANDOM Performed at [facility], [address], [place], [place] [address]    Special Requests   Final    NONE Reflexed from [id number] Performed at [facility], [address], [place], [place] [address]    Culture (A)  Final    <10,000 COLONIES/mL INSIGNIFICANT GROWTH Performed at [facility], [address] [place], [place] [address]    Report Status 11/26/2023 FINAL  Final  Blood culture (routine x 2)     Status: Abnormal   Collection Time: 11/25/23  6:50 PM   Specimen: BLOOD  Result Value Ref Range Status   Specimen Description   Final    BLOOD LEFT ANTECUBITAL Performed at [facility], [address], [place], [place] [address]    Special Requests   Final    BOTTLES DRAWN AEROBIC AND ANAEROBIC Blood Culture adequate volume Performed at [facility], [address] [place], [place] [address]    Culture  Setup Time    Final    GRAM POSITIVE RODS ANAEROBIC BOTTLE ONLY CRITICAL RESULT CALLED TO, READ BACK BY AND VERIFIED WITH: PHARMD NATHAN BATCHELDER [id number] AT 1022 BY EC GRAM POSITIVE COCCI IN CLUSTERS AEROBIC BOTTLE ONLY CRITICAL RESULT CALLED TO, READ BACK BY AND VERIFIED WITH: PHARMD ABBY Weston Anna [phone] 1623 BY EC Performed at [facility], [address] [place], [place] [address]    Culture (A)  Final    STAPHYLOCOCCUS HAEMOLYTICUS STAPHYLOCOCCUS EPIDERMIDIS STREPTOCOCCUS SANGUINIS THE SIGNIFICANCE OF ISOLATING THIS ORGANISM FROM A SINGLE SET OF BLOOD CULTURES WHEN MULTIPLE SETS ARE DRAWN IS UNCERTAIN. PLEASE NOTIFY THE MICROBIOLOGY DEPARTMENT WITHIN ONE WEEK IF SPECIATION AND SENSITIVITIES ARE REQUIRED. CLOSTRIDIUM PERFRINGENS    Report Status 11/29/2023 FINAL  Final  Blood Culture ID Panel (Reflexed)     Status: Abnormal   Collection Time: 11/25/23  6:50 PM  Result Value Ref Range Status   Enterococcus faecalis NOT DETECTED NOT DETECTED Final   Enterococcus Faecium NOT DETECTED NOT DETECTED Final   Listeria monocytogenes NOT DETECTED NOT DETECTED Final   Staphylococcus species DETECTED (A) NOT DETECTED Final    Comment: CRITICAL RESULT CALLED TO, READ BACK BY AND VERIFIED WITH: PHARMD ABBY ELLINGTON [id number] AT 1623 BY EC    Staphylococcus aureus (BCID) NOT  DETECTED NOT DETECTED Final   Staphylococcus epidermidis DETECTED (A) NOT DETECTED Final    Comment: Methicillin (oxacillin) resistant coagulase negative staphylococcus. Possible blood culture contaminant (unless isolated from more than one blood culture draw or clinical case suggests pathogenicity). No antibiotic treatment is indicated for blood  culture contaminants. CRITICAL RESULT CALLED TO, READ BACK BY AND VERIFIED WITH: PHARMD ABBY Weston Anna [id number] AT 1623 VY EC    Staphylococcus lugdunensis NOT DETECTED NOT DETECTED Final   Streptococcus species NOT DETECTED NOT DETECTED Final   Streptococcus agalactiae NOT  DETECTED NOT DETECTED Final   Streptococcus pneumoniae NOT DETECTED NOT DETECTED Final   Streptococcus pyogenes NOT DETECTED NOT DETECTED Final   A.calcoaceticus-baumannii NOT DETECTED NOT DETECTED Final   Bacteroides fragilis NOT DETECTED NOT DETECTED Final   Enterobacterales NOT DETECTED NOT DETECTED Final   Enterobacter cloacae complex NOT DETECTED NOT DETECTED Final   Escherichia coli NOT DETECTED NOT DETECTED Final   Klebsiella aerogenes NOT DETECTED NOT DETECTED Final   Klebsiella oxytoca NOT DETECTED NOT DETECTED Final   Klebsiella pneumoniae NOT DETECTED NOT DETECTED Final   Proteus species NOT DETECTED NOT DETECTED Final   Salmonella species NOT DETECTED NOT DETECTED Final   Serratia marcescens NOT DETECTED NOT DETECTED Final   Haemophilus influenzae NOT DETECTED NOT DETECTED Final   Neisseria meningitidis NOT DETECTED NOT DETECTED Final   Pseudomonas aeruginosa NOT DETECTED NOT DETECTED Final   Stenotrophomonas maltophilia NOT DETECTED NOT DETECTED Final   Candida albicans NOT DETECTED NOT DETECTED Final   Candida auris NOT DETECTED NOT DETECTED Final   Candida glabrata NOT DETECTED NOT DETECTED Final   Candida krusei NOT DETECTED NOT DETECTED Final   Candida parapsilosis NOT DETECTED NOT DETECTED Final   Candida tropicalis NOT DETECTED NOT DETECTED Final   Cryptococcus neoformans/gattii NOT DETECTED NOT DETECTED Final   Methicillin resistance mecA/C DETECTED (A) NOT DETECTED Final    Comment: CRITICAL RESULT CALLED TO, READ BACK BY AND VERIFIED WITH: PHARMD ABBY Weston Anna [id number] AT 1623 BY EC Performed at [facility], [address], [place], [place] [address]   Blood culture (routine x 2)     Status: None (Preliminary result)   Collection Time: 11/25/23 10:18 PM   Specimen: BLOOD  Result Value Ref Range Status   Specimen Description   Final    BLOOD BLOOD RIGHT HAND Performed at [facility], [address] [place], [place] [address]     Special Requests   Final    BOTTLES DRAWN AEROBIC AND ANAEROBIC Blood Culture results may not be optimal due to an inadequate volume of blood received in culture bottles Performed at [facility], [address], [place], [place] [address]    Culture   Final    NO GROWTH 4 DAYS Performed at [facility], [address] [place], [place] [address]    Report Status PENDING  Incomplete     Labs: BNP (last 3 results) Recent Labs    08/30/23 1623 10/09/23 1100 10/12/23 1824  BNP 17.1 20.8 226.7*   Basic Metabolic Panel: Recent Labs  Lab 11/25/23 1810 11/26/23 0559  NA 139 139  K 3.8 3.6  CL 104 106  CO2 26 24  GLUCOSE 94 84  BUN 16 14  CREATININE 1.06* 1.01*  CALCIUM 8.8* 8.5*   Liver Function Tests: Recent Labs  Lab 11/25/23 1810  AST 24  ALT 21  ALKPHOS 80  BILITOT 2.4*  PROT 6.8  ALBUMIN 3.5  No results for input(s): "LIPASE", "AMYLASE" in the last 168 hours. No results for input(s): "AMMONIA" in the last 168 hours. CBC: Recent Labs  Lab 11/25/23 1810 11/26/23 0559 11/27/23 1352  WBC 11.8* 9.6 8.7  NEUTROABS 8.0*  --   --   HGB 11.5* 10.7* 11.7*  HCT 36.6 34.2* 36.5  MCV 93.4 95.0 93.1  PLT 320 278 298   Cardiac Enzymes: Recent Labs  Lab 11/25/23 1810 11/26/23 0559  CKTOTAL 322* 231   BNP: Invalid input(s): "POCBNP" CBG: Recent Labs  Lab 11/29/23 1157 11/29/23 1647 11/29/23 2210 11/30/23 0823 11/30/23 1141  GLUCAP 122* 93 96 102* 117*   D-Dimer No results for input(s): "DDIMER" in the last 72 hours. Hgb A1c No results for input(s): "HGBA1C" in the last 72 hours. Lipid Profile No results for input(s): "CHOL", "HDL", "LDLCALC", "TRIG", "CHOLHDL", "LDLDIRECT" in the last 72 hours. Thyroid function studies No results for input(s): "TSH", "T4TOTAL", "T3FREE", "THYROIDAB" in the last 72 hours.  Invalid input(s): "FREET3" Anemia work up No results for input(s): "VITAMINB12", "FOLATE", "FERRITIN", "TIBC",  "IRON", "RETICCTPCT" in the last 72 hours. Urinalysis    Component Value Date/Time   COLORURINE YELLOW 11/25/2023 1833   APPEARANCEUR HAZY (A) 11/25/2023 1833   LABSPEC 1.015 11/25/2023 1833   PHURINE 5.0 11/25/2023 1833   GLUCOSEU NEGATIVE 11/25/2023 1833   GLUCOSEU NEGATIVE 01/30/2022 1342   HGBUR LARGE (A) 11/25/2023 1833   BILIRUBINUR NEGATIVE 11/25/2023 1833   KETONESUR 5 (A) 11/25/2023 1833   PROTEINUR NEGATIVE 11/25/2023 1833   UROBILINOGEN 0.2 01/30/2022 1342   NITRITE NEGATIVE 11/25/2023 1833   LEUKOCYTESUR TRACE (A) 11/25/2023 1833   Sepsis Labs Recent Labs  Lab 11/25/23 1810 11/26/23 0559 11/27/23 1352  WBC 11.8* 9.6 8.7   Microbiology Recent Results (from the past 240 hour(s))  Urine Culture     Status: Abnormal   Collection Time: 11/25/23  6:33 PM   Specimen: Urine, Random  Result Value Ref Range Status   Specimen Description   Final    URINE, RANDOM Performed at [facility], [address] [place], [place] [address]    Special Requests   Final    NONE Reflexed from [id number] Performed at [facility], [address], [place], [place] [address]    Culture (A)  Final    <10,000 COLONIES/mL INSIGNIFICANT GROWTH Performed at [facility], [address], [place], [place] [address]    Report Status 11/26/2023 FINAL  Final  Blood culture (routine x 2)     Status: Abnormal   Collection Time: 11/25/23  6:50 PM   Specimen: BLOOD  Result Value Ref Range Status   Specimen Description   Final    BLOOD LEFT ANTECUBITAL Performed at [facility], [address], [place], [place] [address]    Special Requests   Final    BOTTLES DRAWN AEROBIC AND ANAEROBIC Blood Culture adequate volume Performed at [facility], [address], [place], [place] [address]    Culture  Setup Time   Final    GRAM POSITIVE RODS ANAEROBIC BOTTLE ONLY CRITICAL RESULT CALLED TO, READ BACK BY AND  VERIFIED WITH: PHARMD NATHAN BATCHELDER [phone] AT 1022 BY EC GRAM POSITIVE COCCI IN CLUSTERS AEROBIC BOTTLE ONLY CRITICAL RESULT CALLED TO, READ BACK BY AND VERIFIED WITH: PHARMD ABBY Weston Anna [phone] 1623 BY EC Performed at [facility], [address], [place], [place] [address]    Culture (A)  Final  STAPHYLOCOCCUS HAEMOLYTICUS STAPHYLOCOCCUS EPIDERMIDIS STREPTOCOCCUS SANGUINIS THE SIGNIFICANCE OF ISOLATING THIS ORGANISM FROM A SINGLE SET OF BLOOD CULTURES WHEN MULTIPLE SETS ARE DRAWN IS UNCERTAIN. PLEASE NOTIFY THE MICROBIOLOGY DEPARTMENT WITHIN ONE WEEK IF SPECIATION AND SENSITIVITIES ARE REQUIRED. CLOSTRIDIUM PERFRINGENS    Report Status 11/29/2023 FINAL  Final  Blood Culture ID Panel (Reflexed)     Status: Abnormal   Collection Time: 11/25/23  6:50 PM  Result Value Ref Range Status   Enterococcus faecalis NOT DETECTED NOT DETECTED Final   Enterococcus Faecium NOT DETECTED NOT DETECTED Final   Listeria monocytogenes NOT DETECTED NOT DETECTED Final   Staphylococcus species DETECTED (A) NOT DETECTED Final    Comment: CRITICAL RESULT CALLED TO, READ BACK BY AND VERIFIED WITH: PHARMD ABBY Weston Anna [id number] AT 1623 BY EC    Staphylococcus aureus (BCID) NOT DETECTED NOT DETECTED Final   Staphylococcus epidermidis DETECTED (A) NOT DETECTED Final    Comment: Methicillin (oxacillin) resistant coagulase negative staphylococcus. Possible blood culture contaminant (unless isolated from more than one blood culture draw or clinical case suggests pathogenicity). No antibiotic treatment is indicated for blood  culture contaminants. CRITICAL RESULT CALLED TO, READ BACK BY AND VERIFIED WITH: PHARMD ABBY Weston Anna [id number] AT 1623 VY EC    Staphylococcus lugdunensis NOT DETECTED NOT DETECTED Final   Streptococcus species NOT DETECTED NOT DETECTED Final   Streptococcus agalactiae NOT DETECTED NOT DETECTED Final   Streptococcus pneumoniae NOT DETECTED NOT DETECTED Final    Streptococcus pyogenes NOT DETECTED NOT DETECTED Final   A.calcoaceticus-baumannii NOT DETECTED NOT DETECTED Final   Bacteroides fragilis NOT DETECTED NOT DETECTED Final   Enterobacterales NOT DETECTED NOT DETECTED Final   Enterobacter cloacae complex NOT DETECTED NOT DETECTED Final   Escherichia coli NOT DETECTED NOT DETECTED Final   Klebsiella aerogenes NOT DETECTED NOT DETECTED Final   Klebsiella oxytoca NOT DETECTED NOT DETECTED Final   Klebsiella pneumoniae NOT DETECTED NOT DETECTED Final   Proteus species NOT DETECTED NOT DETECTED Final   Salmonella species NOT DETECTED NOT DETECTED Final   Serratia marcescens NOT DETECTED NOT DETECTED Final   Haemophilus influenzae NOT DETECTED NOT DETECTED Final   Neisseria meningitidis NOT DETECTED NOT DETECTED Final   Pseudomonas aeruginosa NOT DETECTED NOT DETECTED Final   Stenotrophomonas maltophilia NOT DETECTED NOT DETECTED Final   Candida albicans NOT DETECTED NOT DETECTED Final   Candida auris NOT DETECTED NOT DETECTED Final   Candida glabrata NOT DETECTED NOT DETECTED Final   Candida krusei NOT DETECTED NOT DETECTED Final   Candida parapsilosis NOT DETECTED NOT DETECTED Final   Candida tropicalis NOT DETECTED NOT DETECTED Final   Cryptococcus neoformans/gattii NOT DETECTED NOT DETECTED Final   Methicillin resistance mecA/C DETECTED (A) NOT DETECTED Final    Comment: CRITICAL RESULT CALLED TO, READ BACK BY AND VERIFIED WITH: PHARMD ABBY Weston Anna [id number] AT 1623 BY EC Performed at [facility], [address] [place], [place] [address]   Blood culture (routine x 2)     Status: None (Preliminary result)   Collection Time: 11/25/23 10:18 PM   Specimen: BLOOD  Result Value Ref Range Status   Specimen Description   Final    BLOOD BLOOD RIGHT HAND Performed at [facility], [address], [place], [place] [address]    Special Requests   Final    BOTTLES DRAWN AEROBIC AND ANAEROBIC Blood Culture results may  not be optimal due to an inadequate volume of blood received in culture bottles Performed at [facility],  [address], [place], [place] [address]    Culture   Final    NO GROWTH 4 DAYS Performed at [facility], [address] [place], [place] [address]    Report Status PENDING  Incomplete    Procedures/Studies: MR BRAIN WO CONTRAST  Result Date: 11/28/2023 CLINICAL DATA:  Psychosis hallucinations EXAM: MRI HEAD WITHOUT CONTRAST TECHNIQUE: Multiplanar, multiecho pulse sequences of the brain and surrounding structures were obtained without intravenous contrast. COMPARISON:  CT Head 11/25/23 FINDINGS: Brain: Negative for an acute infarct. No hemorrhage. No hydrocephalus. No extra-axial fluid collection. No mass effect. No mass lesion. There is a background of mild chronic microvascular ischemic change. Vascular: Normal flow voids. Skull and upper cervical spine: Normal marrow signal. Sinuses/Orbits: No middle ear or mastoid effusion. Paranasal sinuses are clear. Orbits are unremarkable. Other: None. IMPRESSION: No acute intracranial process. Electronically Signed   By: Lorenza Cambridge M.D.   On: 11/28/2023 13:04   DG Chest Portable 1 View  Result Date: 11/25/2023 CLINICAL DATA:  Altered mental status EXAM: PORTABLE CHEST 1 VIEW COMPARISON:  10/12/2023 FINDINGS: Cardiomegaly with vascular congestion. Aortic atherosclerosis. No pleural effusion or pneumothorax. IMPRESSION: Cardiomegaly with vascular congestion. Electronically Signed   By: Jasmine Pang M.D.   On: 11/25/2023 22:11   CT Head Wo Contrast  Result Date: 11/25/2023 CLINICAL DATA:  Altered mental status EXAM: CT HEAD WITHOUT CONTRAST TECHNIQUE: Contiguous axial images were obtained from the base of the skull through the vertex without intravenous contrast. RADIATION DOSE REDUCTION: This exam was performed according to the departmental dose-optimization program which includes automated exposure control, adjustment  of the mA and/or kV according to patient size and/or use of iterative reconstruction technique. COMPARISON:  10/09/2023 FINDINGS: Brain: No mass,hemorrhage or extra-axial collection. Normal appearance of the parenchyma and CSF spaces. Vascular: No hyperdense vessel or unexpected vascular calcification. Skull: The visualized skull base, calvarium and extracranial soft tissues are normal. Sinuses/Orbits: No fluid levels or advanced mucosal thickening of the visualized paranasal sinuses. No mastoid or middle ear effusion. Normal orbits. IMPRESSION: Normal head CT. Electronically Signed   By: Deatra Robinson M.D.   On: 11/25/2023 19:59     Time coordinating discharge: Over 30 minutes    Lewie Chamber, MD  [facility] 11/30/2023, 2:18 PM

## 2023-12-01 LAB — CULTURE, BLOOD (ROUTINE X 2): Culture: NO GROWTH

## 2023-12-09 ENCOUNTER — Emergency Department (HOSPITAL_COMMUNITY)
Admission: EM | Admit: 2023-12-09 | Discharge: 2023-12-10 | Disposition: A | Discharge status: Home / Self Care | Payer: 59 | Attending: Emergency Medicine | Admitting: Emergency Medicine

## 2023-12-09 ENCOUNTER — Emergency Department (HOSPITAL_COMMUNITY): Payer: 59

## 2023-12-09 ENCOUNTER — Other Ambulatory Visit: Payer: Self-pay

## 2023-12-09 ENCOUNTER — Encounter (HOSPITAL_COMMUNITY): Payer: Self-pay

## 2023-12-09 DIAGNOSIS — I110 Hypertensive heart disease with heart failure: Secondary | ICD-10-CM | POA: Insufficient documentation

## 2023-12-09 DIAGNOSIS — R0602 Shortness of breath: Secondary | ICD-10-CM | POA: Diagnosis not present

## 2023-12-09 DIAGNOSIS — E119 Type 2 diabetes mellitus without complications: Secondary | ICD-10-CM | POA: Insufficient documentation

## 2023-12-09 DIAGNOSIS — K219 Gastro-esophageal reflux disease without esophagitis: Secondary | ICD-10-CM | POA: Insufficient documentation

## 2023-12-09 DIAGNOSIS — W010XXA Fall on same level from slipping, tripping and stumbling without subsequent striking against object, initial encounter: Secondary | ICD-10-CM | POA: Insufficient documentation

## 2023-12-09 DIAGNOSIS — M549 Dorsalgia, unspecified: Secondary | ICD-10-CM | POA: Diagnosis not present

## 2023-12-09 DIAGNOSIS — M6281 Muscle weakness (generalized): Secondary | ICD-10-CM | POA: Insufficient documentation

## 2023-12-09 DIAGNOSIS — M25511 Pain in right shoulder: Secondary | ICD-10-CM | POA: Insufficient documentation

## 2023-12-09 DIAGNOSIS — G8929 Other chronic pain: Secondary | ICD-10-CM | POA: Diagnosis not present

## 2023-12-09 DIAGNOSIS — Z7985 Long-term (current) use of injectable non-insulin antidiabetic drugs: Secondary | ICD-10-CM | POA: Insufficient documentation

## 2023-12-09 DIAGNOSIS — I509 Heart failure, unspecified: Secondary | ICD-10-CM | POA: Diagnosis not present

## 2023-12-09 DIAGNOSIS — Z9181 History of falling: Secondary | ICD-10-CM | POA: Diagnosis not present

## 2023-12-09 DIAGNOSIS — M542 Cervicalgia: Secondary | ICD-10-CM | POA: Diagnosis not present

## 2023-12-09 DIAGNOSIS — Z7409 Other reduced mobility: Secondary | ICD-10-CM | POA: Insufficient documentation

## 2023-12-09 DIAGNOSIS — R202 Paresthesia of skin: Secondary | ICD-10-CM | POA: Insufficient documentation

## 2023-12-09 DIAGNOSIS — Z91040 Latex allergy status: Secondary | ICD-10-CM | POA: Diagnosis not present

## 2023-12-09 LAB — COMPREHENSIVE METABOLIC PANEL
ALT: 19 U/L (ref 0–44)
AST: 19 U/L (ref 15–41)
Albumin: 3.5 g/dL (ref 3.5–5.0)
Alkaline Phosphatase: 82 U/L (ref 38–126)
Anion gap: 11 (ref 5–15)
BUN: 11 mg/dL (ref 8–23)
CO2: 25 mmol/L (ref 22–32)
Calcium: 8.9 mg/dL (ref 8.9–10.3)
Chloride: 105 mmol/L (ref 98–111)
Creatinine, Ser: 1.09 mg/dL — ABNORMAL HIGH (ref 0.44–1.00)
GFR, Estimated: 56 mL/min — ABNORMAL LOW (ref >60)
Glucose, Bld: 104 mg/dL — ABNORMAL HIGH (ref 70–99)
Potassium: 3.3 mmol/L — ABNORMAL LOW (ref 3.5–5.1)
Sodium: 141 mmol/L (ref 135–145)
Total Bilirubin: 0.8 mg/dL (ref <1.2)
Total Protein: 6.8 g/dL (ref 6.5–8.1)

## 2023-12-09 LAB — CBC WITH DIFFERENTIAL/PLATELET
Abs Immature Granulocytes: 0.03 10*3/uL (ref 0.00–0.07)
Basophils Absolute: 0.1 10*3/uL (ref 0.0–0.1)
Basophils Relative: 1 %
Eosinophils Absolute: 0.2 10*3/uL (ref 0.0–0.5)
Eosinophils Relative: 2 %
HCT: 37.6 % (ref 36.0–46.0)
Hemoglobin: 12 g/dL (ref 12.0–15.0)
Immature Granulocytes: 0 %
Lymphocytes Relative: 29 %
Lymphs Abs: 2.6 10*3/uL (ref 0.7–4.0)
MCH: 29.6 pg (ref 26.0–34.0)
MCHC: 31.9 g/dL (ref 30.0–36.0)
MCV: 92.6 fL (ref 80.0–100.0)
Monocytes Absolute: 0.8 10*3/uL (ref 0.1–1.0)
Monocytes Relative: 9 %
Neutro Abs: 5.3 10*3/uL (ref 1.7–7.7)
Neutrophils Relative %: 59 %
Platelets: 325 10*3/uL (ref 150–400)
RBC: 4.06 MIL/uL (ref 3.87–5.11)
RDW: 13.3 % (ref 11.5–15.5)
WBC: 9 10*3/uL (ref 4.0–10.5)
nRBC: 0 % (ref 0.0–0.2)

## 2023-12-09 LAB — TROPONIN I (HIGH SENSITIVITY): Troponin I (High Sensitivity): 5 ng/L (ref <18)

## 2023-12-09 LAB — BRAIN NATRIURETIC PEPTIDE: B Natriuretic Peptide: 26.3 pg/mL (ref 0.0–100.0)

## 2023-12-09 NOTE — ED Triage Notes (Signed)
PER EMS: pt is from home with c/o right shoulder pain that radiates down her arm, onset Dec 1, associated with bilateral leg swelling, weakness and paraesthesia. She reports she has gained 3lbs since yesterday. She also reports decreased urine output.   BP- 152/108, HR-88, 98% RA, RR-16 Cbg-198 22g left hand

## 2023-12-09 NOTE — ED Provider Triage Note (Signed)
Emergency Medicine Provider Triage Evaluation Note  Maria [name] , a 67 y.o. female  was evaluated in triage.  Pt complains of who presents with nearly 3 weeks of right shoulder pain and paresthesias down both arms as well as new bilateral lower extremity swelling and significantly decreased urine output for the last 48 hours.  No fevers or chills, no chest pain but does endorse some shortness of breath.,  BMI of 54.  Review of Systems  Positive: See above Negative: Chest pain, new weakness, speech difficulties.  Physical Exam  BP 123/67 (BP Location: Left Arm)   Pulse 84   Temp (!) 97.5 F (36.4 C) (Oral)   Resp 16   Ht 5\' 3"  (1.6 m)   Wt (!) 139.3 kg   SpO2 100%   BMI 54.38 kg/m  Gen:   Awake, no distress   Resp:  Normal effort  MSK:   Moves extremities without difficulty  Other:  RRR no m/r/g, lungs CTAB. Exam limited by body habitus.   Medical Decision Making  Medically screening exam initiated at 10:52 PM.  Appropriate orders placed.  [name] was informed that the remainder of the evaluation will be completed by another provider, this initial triage assessment does not replace that evaluation, and the importance of remaining in the ED until their evaluation is complete.  This chart was dictated using voice recognition software, Dragon. Despite the best efforts of this provider to proofread and correct errors, errors may still occur which can change documentation meaning.    [name], PA-C 12/09/23 2252

## 2023-12-10 ENCOUNTER — Emergency Department (HOSPITAL_COMMUNITY): Payer: 59

## 2023-12-10 ENCOUNTER — Emergency Department (HOSPITAL_COMMUNITY)
Admission: EM | Admit: 2023-12-10 | Discharge: 2023-12-12 | Disposition: A | Discharge status: Home / Self Care | Payer: 59 | Source: Home / Self Care

## 2023-12-10 DIAGNOSIS — W01190A Fall on same level from slipping, tripping and stumbling with subsequent striking against furniture, initial encounter: Secondary | ICD-10-CM | POA: Insufficient documentation

## 2023-12-10 DIAGNOSIS — Z91040 Latex allergy status: Secondary | ICD-10-CM | POA: Insufficient documentation

## 2023-12-10 DIAGNOSIS — M542 Cervicalgia: Secondary | ICD-10-CM | POA: Insufficient documentation

## 2023-12-10 DIAGNOSIS — M25562 Pain in left knee: Secondary | ICD-10-CM | POA: Insufficient documentation

## 2023-12-10 DIAGNOSIS — Z79899 Other long term (current) drug therapy: Secondary | ICD-10-CM | POA: Insufficient documentation

## 2023-12-10 DIAGNOSIS — E119 Type 2 diabetes mellitus without complications: Secondary | ICD-10-CM | POA: Insufficient documentation

## 2023-12-10 DIAGNOSIS — Y9301 Activity, walking, marching and hiking: Secondary | ICD-10-CM | POA: Insufficient documentation

## 2023-12-10 DIAGNOSIS — I509 Heart failure, unspecified: Secondary | ICD-10-CM | POA: Insufficient documentation

## 2023-12-10 DIAGNOSIS — Y92018 Other place in single-family (private) house as the place of occurrence of the external cause: Secondary | ICD-10-CM | POA: Insufficient documentation

## 2023-12-10 DIAGNOSIS — W19XXXA Unspecified fall, initial encounter: Secondary | ICD-10-CM

## 2023-12-10 DIAGNOSIS — M25551 Pain in right hip: Secondary | ICD-10-CM | POA: Insufficient documentation

## 2023-12-10 DIAGNOSIS — I110 Hypertensive heart disease with heart failure: Secondary | ICD-10-CM | POA: Insufficient documentation

## 2023-12-10 LAB — URINALYSIS, ROUTINE W REFLEX MICROSCOPIC
Bilirubin Urine: NEGATIVE
Glucose, UA: NEGATIVE mg/dL
Hgb urine dipstick: NEGATIVE
Ketones, ur: NEGATIVE mg/dL
Nitrite: NEGATIVE
Protein, ur: NEGATIVE mg/dL
Specific Gravity, Urine: 1.019 (ref 1.005–1.030)
pH: 5 (ref 5.0–8.0)

## 2023-12-10 LAB — TROPONIN I (HIGH SENSITIVITY): Troponin I (High Sensitivity): 5 ng/L (ref <18)

## 2023-12-10 MED ORDER — OXYCODONE HCL 5 MG PO TABS
5.0000 mg | ORAL_TABLET | Freq: Once | ORAL | Status: DC
  Filled 2023-12-10: qty 1

## 2023-12-10 MED ORDER — OXYCODONE HCL 5 MG PO TABS
5.0000 mg | ORAL_TABLET | Freq: Once | ORAL | Status: AC
  Administered 2023-12-10: 5 mg via ORAL
  Filled 2023-12-10: qty 1

## 2023-12-10 NOTE — ED Provider Notes (Signed)
[place] EMERGENCY DEPARTMENT AT [facility] Provider Note   CSN: [id number] Arrival date & time: 12/10/23  2151     History  Chief Complaint  Patient presents with   Maria [name] is a 67 y.o. female who lives alone who states that she had a fall at 830 this evening at her home.  Was sitting her bed getting dressed after bathing and lost her balance when standing to pull on her pants.  Larey Seat and hit her knee on the bedside table, hit her head on the carpeted ground.  No LOC, called her sister who called EMS to assist with getting her from the ground.  Complaining of the back of her neck hurting, bilateral knees right greater than left hurting, right hip hurting.  Chronic shoulder pain paresthesias in the left arm for which she was seen in [facility] emergency department this morning with reassuring imaging and labs.  History of hypertension, GERD, chronic low back pain, morbid obesity, type 2 diabetes, heart failure on torsemide.  Patient was recently admitted and had thorough workup including MRI.  Has been following outpatient as well.  HPI     Home Medications Prior to Admission medications   Medication Sig Start Date End Date Taking? Authorizing Provider  acetaminophen (TYLENOL) 650 MG CR tablet Take 1,300 mg by mouth every 8 (eight) hours as needed for pain.    [provider]  albuterol (VENTOLIN HFA) 108 (90 Base) MCG/ACT inhaler Inhale 1 puff into the lungs every 6 (six) hours as needed for wheezing or shortness of breath.    [provider]  Blood Glucose Monitoring Suppl (ACCU-CHEK GUIDE) w/Device KIT Use as directed Patient taking differently: 1 each by Other route as directed. 04/11/21   Zigmund Daniel., MD  cholecalciferol (VITAMIN D3) 25 MCG (1000 UNIT) tablet Take 1,000 Units by mouth daily.    [provider]  clonazePAM (KLONOPIN) 1 MG tablet Take 1 tablet (1 mg total) by mouth 2 (two) times daily as needed. for  anxiety Patient taking differently: Take 1 mg by mouth 2 (two) times daily as needed for anxiety. 10/11/23   Hughie Closs, MD  cyanocobalamin 1000 MCG tablet Take 1 tablet (1,000 mcg total) by mouth daily. 07/18/23   Alicia Amel, MD  cyclobenzaprine (FEXMID) 7.5 MG tablet Take 7.5 mg by mouth 3 (three) times daily as needed for muscle spasms. 11/23/23   [provider]  DULoxetine (CYMBALTA) 20 MG capsule Take 20 mg by mouth at bedtime.    [provider]  famotidine (PEPCID) 40 MG tablet Take 40 mg by mouth daily. 10/04/23   [provider]  fluticasone (FLONASE) 50 MCG/ACT nasal spray Place 1 spray into both nostrils daily. 12/07/22   [provider]  glucose blood (ACCU-CHEK GUIDE) test strip Use as instructed up to 4 times daily 04/11/21   Zigmund Daniel., MD  Metamucil Fiber CHEW Chew 3 tablets by mouth at bedtime.    [provider]  metoprolol succinate (TOPROL-XL) 25 MG 24 hr tablet Take 1 tablet (25 mg total) by mouth daily. 07/18/23   Alicia Amel, MD  NARCAN 4 MG/0.1ML LIQD nasal spray kit Place 0.4 mg into the nose daily as needed (opioid overdose). 04/25/19   [provider]  ondansetron (ZOFRAN-ODT) 4 MG disintegrating tablet Take 4 mg by mouth as needed for nausea or vomiting. 09/14/23   [provider]  Oxycodone HCl 10 MG  TABS Take 1 tablet (10 mg total) by mouth in the morning and at bedtime. 11/30/23   Lewie Chamber, MD  pantoprazole (PROTONIX) 40 MG tablet TAKE ONE TABLET BY MOUTH DAILY Patient taking differently: Take 40 mg by mouth daily. 01/29/22   Corwin Levins, MD  Potassium Chloride ER 20 MEQ TBCR Take 40 mEq by mouth in the morning and at bedtime. 11/16/23   [provider]  spironolactone (ALDACTONE) 25 MG tablet Take 0.5 tablets (12.5 mg total) by mouth daily. Patient taking differently: Take 25 mg by mouth daily. 08/14/22   Sharlene Dory, NP  tirzepatide [facility]) 15 MG/0.5ML Pen Inject  15 mg into the skin once a week.    [provider]  torsemide (DEMADEX) 20 MG tablet Take 2 tablets (40 mg total) by mouth daily. 11/30/23   Lewie Chamber, MD      Allergies    Amoxicillin-pot clavulanate, Adhesive [tape], Codeine, Crestor [rosuvastatin calcium], Gabapentin, Lyrica [pregabalin], Morphine and codeine, Vicodin [hydrocodone-acetaminophen], and Latex    Review of Systems   Review of Systems  Musculoskeletal:  Positive for neck pain.       Knee and hip pain    Physical Exam Updated Vital Signs BP 135/75   Pulse (!) 39   Temp 98 F (36.7 C)   Resp 18   SpO2 97%  Physical Exam Vitals and nursing note reviewed.  Constitutional:      Appearance: She is morbidly obese. She is not toxic-appearing.     Interventions: Cervical collar in place.  HENT:     Head: Normocephalic and atraumatic.     Mouth/Throat:     Mouth: Mucous membranes are moist.     Pharynx: Oropharynx is clear. Uvula midline. No oropharyngeal exudate or posterior oropharyngeal erythema.  Eyes:     General: Lids are normal. Vision grossly intact.        Right eye: No discharge.        Left eye: No discharge.     Conjunctiva/sclera: Conjunctivae normal.     Pupils: Pupils are equal, round, and reactive to light.  Cardiovascular:     Rate and Rhythm: Normal rate and regular rhythm.     Pulses: Normal pulses.     Heart sounds: Normal heart sounds. No murmur heard. Pulmonary:     Effort: Pulmonary effort is normal. No tachypnea, bradypnea, accessory muscle usage or respiratory distress.     Breath sounds: Normal breath sounds. No wheezing or rales.  Chest:     Chest wall: No mass, lacerations, deformity, swelling, tenderness or crepitus.  Abdominal:     General: Bowel sounds are normal. There is no distension.     Tenderness: There is no abdominal tenderness.     Hernia: A hernia is present. Hernia is present in the umbilical area.    Musculoskeletal:        General: No deformity.      Cervical back: Neck supple. Bony tenderness present.     Thoracic back: Normal.     Lumbar back: Normal.     Right hip: Tenderness present. Decreased range of motion.     Right knee: Bony tenderness present. Decreased range of motion.     Left knee: Bony tenderness present. Decreased range of motion.     Right lower leg: 3+ Edema present.     Left lower leg: 3+ Edema present.  Skin:    General: Skin is warm and dry.     Findings: Rash present.  Neurological:  Mental Status: She is alert. Mental status is at baseline.     GCS: GCS eye subscore is 4. GCS verbal subscore is 5. GCS motor subscore is 6.     Sensory: Sensation is intact.     Motor: Weakness present.     Comments: Global weakness in bilateral lower extremities  Psychiatric:        Mood and Affect: Mood normal.     ED Results / Procedures / Treatments   Labs (all labs ordered are listed, but only abnormal results are displayed) Labs Reviewed  CBC WITH DIFFERENTIAL/PLATELET - Abnormal; Notable for the following components:      Result Value   RBC 3.65 (*)    Hemoglobin 11.1 (*)    HCT 33.4 (*)    All other components within normal limits  BASIC METABOLIC PANEL - Abnormal; Notable for the following components:   Potassium 3.3 (*)    Calcium 8.8 (*)    All other components within normal limits  BRAIN NATRIURETIC PEPTIDE    EKG None  Radiology DG Knee Complete 4 Views Right Result Date: 12/11/2023 CLINICAL DATA:  Fall EXAM: RIGHT KNEE - COMPLETE 4+ VIEW COMPARISON:  08/30/2023 FINDINGS: Moderate tricompartment degenerative changes most pronounced in the medial and patellofemoral compartments. No joint effusion. No acute bony abnormality. Specifically, no fracture, subluxation, or dislocation. IMPRESSION: Moderate degenerative changes.  No acute bony abnormality. Electronically Signed   By: Charlett Nose M.D.   On: 12/11/2023 00:27   DG Knee Complete 4 Views Left Result Date: 12/11/2023 CLINICAL DATA:  Fall  EXAM: LEFT KNEE - COMPLETE 4+ VIEW COMPARISON:  08/30/2023 FINDINGS: Moderate tricompartment degenerative changes most pronounced in the medial and patellofemoral compartments. No joint effusion. No acute bony abnormality. Specifically, no fracture, subluxation, or dislocation. IMPRESSION: Moderate degenerative changes.  No acute bony abnormality. Electronically Signed   By: Charlett Nose M.D.   On: 12/11/2023 00:26   DG Hip Unilat W or Wo Pelvis 2-3 Views Right Result Date: 12/11/2023 CLINICAL DATA:  Fall EXAM: DG HIP (WITH OR WITHOUT PELVIS) 2-3V RIGHT COMPARISON:  None Available. FINDINGS: Study limited by body habitus. No visible acute bony abnormality. No visible fracture, subluxation or dislocation. SI joints and hip joints symmetric. IMPRESSION: Limited study by body habitus. No acute bony abnormality visualized. Electronically Signed   By: Charlett Nose M.D.   On: 12/11/2023 00:24   CT Cervical Spine Wo Contrast Result Date: 12/11/2023 CLINICAL DATA:  Status post fall. EXAM: CT CERVICAL SPINE WITHOUT CONTRAST TECHNIQUE: Multidetector CT imaging of the cervical spine was performed without intravenous contrast. Multiplanar CT image reconstructions were also generated. RADIATION DOSE REDUCTION: This exam was performed according to the departmental dose-optimization program which includes automated exposure control, adjustment of the mA and/or kV according to patient size and/or use of iterative reconstruction technique. COMPARISON:  December 09, 2023 FINDINGS: Alignment: Normal. Skull base and vertebrae: No acute fracture. No primary bone lesion or focal pathologic process. A metallic density fusion plate and screws are seen along the anterior aspects of the C5, C6 and C7 vertebral bodies. Soft tissues and spinal canal: No prevertebral fluid or swelling. No visible canal hematoma. Disc levels: Mild to moderate severity anterior osteophyte formation is seen at the level of C4-C5, with moderate to marked  severity intervertebral disc space narrowing also noted at the level of C4-C5. Anterior cervical fusion of the C5-C6 and C6-C7 levels is seen. Bilateral mild multilevel facet joint hypertrophy is noted. Upper chest: Negative. Other: A  stable, previously evaluated 1.7 cm x 1.3 cm thyroid nodule is seen within the left lobe of the thyroid gland. IMPRESSION: 1. No acute fracture or subluxation in the cervical spine. 2. Anterior cervical fusion of the C5-C6 and C6-C7 levels. 3. Moderate to marked severity degenerative disc disease at the level of C4-C5. Electronically Signed   By: Aram Candela M.D.   On: 12/11/2023 00:19   CT Head Wo Contrast Result Date: 12/11/2023 CLINICAL DATA:  Status post fall. EXAM: CT HEAD WITHOUT CONTRAST TECHNIQUE: Contiguous axial images were obtained from the base of the skull through the vertex without intravenous contrast. RADIATION DOSE REDUCTION: This exam was performed according to the departmental dose-optimization program which includes automated exposure control, adjustment of the mA and/or kV according to patient size and/or use of iterative reconstruction technique. COMPARISON:  December 09, 2023 FINDINGS: Brain: No evidence of acute infarction, hemorrhage, hydrocephalus, extra-axial collection or mass lesion/mass effect. Vascular: No hyperdense vessel or unexpected calcification. Skull: Normal. Negative for fracture or focal lesion. Sinuses/Orbits: Mild bilateral ethmoid sinus mucosal thickening is noted. Other: None. IMPRESSION: 1. No acute intracranial abnormality. Electronically Signed   By: Aram Candela M.D.   On: 12/11/2023 00:14   CT HEAD WO CONTRAST ( ) Result Date: 12/10/2023 CLINICAL DATA:  Shoulder pain EXAM: CT HEAD WITHOUT CONTRAST CT CERVICAL SPINE WITHOUT CONTRAST TECHNIQUE: Multidetector CT imaging of the head and cervical spine was performed following the standard protocol without intravenous contrast. Multiplanar CT image reconstructions of the  cervical spine were also generated. RADIATION DOSE REDUCTION: This exam was performed according to the departmental dose-optimization program which includes automated exposure control, adjustment of the mA and/or kV according to patient size and/or use of iterative reconstruction technique. COMPARISON:  11/25/2023 FINDINGS: CT HEAD FINDINGS Brain: There is no mass, hemorrhage or extra-axial collection. The size and configuration of the ventricles and extra-axial CSF spaces are normal. The brain parenchyma is normal, without evidence of acute or chronic infarction. Vascular: No abnormal hyperdensity of the major intracranial arteries or dural venous sinuses. No intracranial atherosclerosis. Skull: The visualized skull base, calvarium and extracranial soft tissues are normal. Sinuses/Orbits: No fluid levels or advanced mucosal thickening of the visualized paranasal sinuses. No mastoid or middle ear effusion. The orbits are normal. CT CERVICAL SPINE FINDINGS Alignment: No static subluxation. Facets are aligned. Occipital condyles are normally positioned. Skull base and vertebrae: No acute fracture.  C5-7 ACDF Soft tissues and spinal canal: No prevertebral fluid or swelling. No visible canal hematoma. Disc levels: No advanced spinal canal or neural foraminal stenosis. Upper chest: No pneumothorax, pulmonary nodule or pleural effusion. Other: Normal visualized paraspinal cervical soft tissues. IMPRESSION: 1. No acute intracranial abnormality. 2. No acute fracture or static subluxation of the cervical spine. 3. C5-7 ACDF without hardware complication. Electronically Signed   By: Deatra Robinson M.D.   On: 12/10/2023 01:21   CT Cervical Spine Wo Contrast Result Date: 12/10/2023 CLINICAL DATA:  Shoulder pain EXAM: CT HEAD WITHOUT CONTRAST CT CERVICAL SPINE WITHOUT CONTRAST TECHNIQUE: Multidetector CT imaging of the head and cervical spine was performed following the standard protocol without intravenous contrast.  Multiplanar CT image reconstructions of the cervical spine were also generated. RADIATION DOSE REDUCTION: This exam was performed according to the departmental dose-optimization program which includes automated exposure control, adjustment of the mA and/or kV according to patient size and/or use of iterative reconstruction technique. COMPARISON:  11/25/2023 FINDINGS: CT HEAD FINDINGS Brain: There is no mass, hemorrhage or extra-axial collection. The size and  configuration of the ventricles and extra-axial CSF spaces are normal. The brain parenchyma is normal, without evidence of acute or chronic infarction. Vascular: No abnormal hyperdensity of the major intracranial arteries or dural venous sinuses. No intracranial atherosclerosis. Skull: The visualized skull base, calvarium and extracranial soft tissues are normal. Sinuses/Orbits: No fluid levels or advanced mucosal thickening of the visualized paranasal sinuses. No mastoid or middle ear effusion. The orbits are normal. CT CERVICAL SPINE FINDINGS Alignment: No static subluxation. Facets are aligned. Occipital condyles are normally positioned. Skull base and vertebrae: No acute fracture.  C5-7 ACDF Soft tissues and spinal canal: No prevertebral fluid or swelling. No visible canal hematoma. Disc levels: No advanced spinal canal or neural foraminal stenosis. Upper chest: No pneumothorax, pulmonary nodule or pleural effusion. Other: Normal visualized paraspinal cervical soft tissues. IMPRESSION: 1. No acute intracranial abnormality. 2. No acute fracture or static subluxation of the cervical spine. 3. C5-7 ACDF without hardware complication. Electronically Signed   By: Deatra Robinson M.D.   On: 12/10/2023 01:21   DG Chest 2 View Result Date: 12/10/2023 CLINICAL DATA:  Dyspnea EXAM: CHEST - 2 VIEW COMPARISON:  11/25/2023 FINDINGS: Opacification of the lung bases bilaterally relates to overlying soft tissue and underpenetration. Lungs are clear. No pneumothorax or  pleural effusion. Stable cardiomegaly. Pulmonary vascularity is normal. No acute bone abnormality. IMPRESSION: 1. Cardiomegaly. No active cardiopulmonary disease. Electronically Signed   By: Helyn Numbers M.D.   On: 12/10/2023 00:47    Procedures Procedures    Medications Ordered in ED Medications - No data to display  ED Course/ Medical Decision Making/ A&P Clinical Course as of 12/11/23 0630  Sat Dec 11, 2023  0325 Patient stood with 2 person assist, but unable to ambulate, per RN due to instability / fall risk.   [RS]    Clinical Course User Index [RS] Kaleb Linquist, Eugene Gavia, PA-C                                 Medical Decision Making 67 year old female presents with concern for pain after fall.  Hypertensive on intake vitals as normal.  Cardiopulmonary exam unremarkable abdominal exam is benign.  Amount and/or Complexity of Data Reviewed Labs: ordered.    Details: CBC without leukocytosis, mildly of 11.  BMP with hypokalemia 3.3, BNP normal. Radiology: ordered.    Details:   xray of both knees are unremarkable.  X-ray of the hips and pelvis unremarkable, CT scans unremarkable.   Unfortunately patient unable to ambulate in the emergency department.  Will require TOC and PT consults morning for possible placement.  Patient fearful of living in her own home.  Has already completed multiple weeks of home PT without improvement in her she was ability to care for herself at home. Clinical concern for emergent underlying condition that would warrant further ED workup or inpatient management is exceedingly low.   Ayumi  voiced understanding of her medical evaluation and treatment plan. Each of their questions answered to their expressed satisfaction.    Care of this patient signed out to oncoming ED provider Schuman, PA-C at time of shift change. All pertinent HPI, physical exam, and laboratory findings were discussed with them prior to my departure. Disposition of patient  pending completion of workup, reevaluation, and clinical judgement of oncoming ED provider.    This chart was dictated using voice recognition software, Dragon. Despite the best efforts of this provider to proofread and correct  errors, errors may still occur which can change documentation meaning.         Final Clinical Impression(s) / ED Diagnoses Final diagnoses:  None    Rx / DC Orders ED Discharge Orders     None         [name], PA-C 12/11/23 0630    Coral Spikes, DO 12/18/23 [phone]

## 2023-12-10 NOTE — ED Triage Notes (Signed)
Pt bib gems from home. PT had a fall 1 hour prior to arrival while ambulating around her house. Pt reports tripping and falling on her back. Pt denies LO. Pt C?O hip neck and back pain. Pt reports having increasing reduced mobility over the past few days. Hx neuropathy 20G LAC IV EMS administered of fentanyl 156/98 76HR 20RR 91% RA CHF Hx 115CBG

## 2023-12-10 NOTE — ED Notes (Signed)
Taxi voucher provided to pt for transportation home.

## 2023-12-10 NOTE — ED Provider Notes (Signed)
[place] [facility] DEPARTMENT AT [facility] Provider Note   CSN: [id number] Arrival date & time: 12/09/23  2222     History {Add pertinent medical, surgical, social history, OB history to HPI:1} Chief Complaint  Patient presents with   Shoulder Pain    Maria [name] is a 67 y.o. female.  Patient is a 67 year old female presenting for complaints of shoulder pain.  Patient admits to right sided shoulder pain for the past several weeks.  Pain is worse with movement of the shoulder joint.  She also admits to paresthesias described as pins-and-needles in the left hand and left sided neck pain.  The symptoms have been ongoing for months and is followed by her primary care physician.  He has a history of fusions of her cervical spine.  She denies any chest pain or shortness of breath.  She denies any recent falls or trauma.  The history is provided by the patient. No language interpreter was used.  Shoulder Pain Associated symptoms: neck pain   Associated symptoms: no back pain and no fever        Home Medications Prior to Admission medications   Medication Sig Start Date End Date Taking? Authorizing Provider  acetaminophen (TYLENOL) 650 MG CR tablet Take 1,300 mg by mouth every 8 (eight) hours as needed for pain.    [provider]  albuterol (VENTOLIN HFA) 108 (90 Base) MCG/ACT inhaler Inhale 1 puff into the lungs every 6 (six) hours as needed for wheezing or shortness of breath.    [provider]  Blood Glucose Monitoring Suppl (ACCU-CHEK GUIDE) w/Device KIT Use as directed Patient taking differently: 1 each by Other route as directed. 04/11/21   Zigmund Daniel., MD  cholecalciferol (VITAMIN D3) 25 MCG (1000 UNIT) tablet Take 1,000 Units by mouth daily.    [provider]  clonazePAM (KLONOPIN) 1 MG tablet Take 1 tablet (1 mg total) by mouth 2 (two) times daily as needed. for anxiety Patient taking differently: Take 1 mg by mouth 2  (two) times daily as needed for anxiety. 10/11/23   Hughie Closs, MD  cyanocobalamin 1000 MCG tablet Take 1 tablet (1,000 mcg total) by mouth daily. 07/18/23   Wright Gravely Amel, MD  cyclobenzaprine (FEXMID) 7.5 MG tablet Take 7.5 mg by mouth 3 (three) times daily as needed for muscle spasms. 11/23/23   [provider]  DULoxetine (CYMBALTA) 20 MG capsule Take 20 mg by mouth at bedtime.    [provider]  famotidine (PEPCID) 40 MG tablet Take 40 mg by mouth daily. 10/04/23   [provider]  fluticasone (FLONASE) 50 MCG/ACT nasal spray Place 1 spray into both nostrils daily. 12/07/22   [provider]  glucose blood (ACCU-CHEK GUIDE) test strip Use as instructed up to 4 times daily 04/11/21   Zigmund Daniel., MD  Metamucil Fiber CHEW Chew 3 tablets by mouth at bedtime.    [provider]  metoprolol succinate (TOPROL-XL) 25 MG 24 hr tablet Take 1 tablet (25 mg total) by mouth daily. 07/18/23   Coltin Casher Amel, MD  NARCAN 4 MG/0.1ML LIQD nasal spray kit Place 0.4 mg into the nose daily as needed (opioid overdose). 04/25/19   [provider]  ondansetron (ZOFRAN-ODT) 4 MG disintegrating tablet Take 4 mg by mouth as needed for nausea or vomiting. 09/14/23   [provider]  Oxycodone HCl 10 MG TABS Take 1 tablet (10 mg total) by mouth in the morning and  at bedtime. 11/30/23   Lewie Chamber, MD  pantoprazole (PROTONIX) 40 MG tablet TAKE ONE TABLET BY MOUTH DAILY Patient taking differently: Take 40 mg by mouth daily. 01/29/22   Corwin Levins, MD  Potassium Chloride ER 20 MEQ TBCR Take 40 mEq by mouth in the morning and at bedtime. 11/16/23   [provider]  spironolactone (ALDACTONE) 25 MG tablet Take 0.5 tablets (12.5 mg total) by mouth daily. Patient taking differently: Take 25 mg by mouth daily. 08/14/22   Sharlene Dory, NP  tirzepatide [facility]) 15 MG/0.5ML Pen Inject 15 mg into the skin once a week.    [provider]  torsemide (DEMADEX) 20 MG tablet Take 2 tablets (40 mg total) by mouth daily. 11/30/23   Lewie Chamber, MD      Allergies    Amoxicillin-pot clavulanate, Adhesive [tape], Codeine, Crestor [rosuvastatin calcium], Gabapentin, Lyrica [pregabalin], Morphine and codeine, Vicodin [hydrocodone-acetaminophen], and Latex    Review of Systems   Review of Systems  Constitutional:  Negative for chills and fever.  HENT:  Negative for ear pain and sore throat.   Eyes:  Negative for pain and visual disturbance.  Respiratory:  Negative for cough and shortness of breath.   Cardiovascular:  Negative for chest pain and palpitations.  Gastrointestinal:  Negative for abdominal pain and vomiting.  Genitourinary:  Negative for dysuria and hematuria.  Musculoskeletal:  Positive for neck pain. Negative for arthralgias and back pain.  Skin:  Negative for color change and rash.  Neurological:  Negative for seizures and syncope.  All other systems reviewed and are negative.   Physical Exam Updated Vital Signs BP (!) 141/64   Pulse 73   Temp 98 F (36.7 C) (Oral)   Resp 19   Ht 5\' 3"  (1.6 m)   Wt (!) 139.3 kg   SpO2 100%   BMI 54.38 kg/m  Physical Exam Vitals and nursing note reviewed.  Constitutional:      General: She is not in acute distress.    Appearance: She is well-developed.  HENT:     Head: Normocephalic and atraumatic.  Eyes:     Conjunctiva/sclera: Conjunctivae normal.  Cardiovascular:     Rate and Rhythm: Normal rate and regular rhythm.     Heart sounds: No murmur heard. Pulmonary:     Effort: Pulmonary effort is normal. No respiratory distress.     Breath sounds: Normal breath sounds.  Abdominal:     Palpations: Abdomen is soft.     Tenderness: There is no abdominal tenderness.  Musculoskeletal:        General: No swelling.     Right shoulder: Bony tenderness present.     Left shoulder: Normal.     Right upper arm: Normal.     Left upper arm: Normal.     Right elbow:  Normal.     Left elbow: Normal.     Right forearm: Normal.     Left forearm: Normal.     Right wrist: Normal.     Left wrist: Normal.     Right hand: Normal.     Left hand: Normal.     Cervical back: Neck supple. Bony tenderness present.  Skin:    General: Skin is warm and dry.     Capillary Refill: Capillary refill takes less than 2 seconds.  Neurological:     Mental Status: She is alert and oriented to person, place, and time.     GCS: GCS eye subscore is 4. GCS  verbal subscore is 5. GCS motor subscore is 6.  Psychiatric:        Mood and Affect: Mood normal.     ED Results / Procedures / Treatments   Labs (all labs ordered are listed, but only abnormal results are displayed) Labs Reviewed  COMPREHENSIVE METABOLIC PANEL - Abnormal; Notable for the following components:      Result Value   Potassium 3.3 (*)    Glucose, Bld 104 (*)    Creatinine, Ser 1.09 (*)    GFR, Estimated 56 (*)    All other components within normal limits  URINALYSIS, ROUTINE W REFLEX MICROSCOPIC - Abnormal; Notable for the following components:   APPearance HAZY (*)    Leukocytes,Ua TRACE (*)    Bacteria, UA RARE (*)    All other components within normal limits  CBC WITH DIFFERENTIAL/PLATELET  BRAIN NATRIURETIC PEPTIDE  TROPONIN I (HIGH SENSITIVITY)  TROPONIN I (HIGH SENSITIVITY)    EKG EKG Interpretation Date/Time:  Thursday December 09 2023 23:05:50 EST Ventricular Rate:  79 PR Interval:  174 QRS Duration:  92 QT Interval:  386 QTC Calculation: 442 R Axis:   72  Text Interpretation: Normal sinus rhythm Low voltage QRS Cannot rule out Anterior infarct , age undetermined Abnormal ECG When compared with ECG of 25-Nov-2023 18:21, No acute changes Confirmed by Gilda Crease [phone]) on 12/09/2023 11:07:58 PM  Radiology CT HEAD WO CONTRAST ( ) Result Date: 12/10/2023 CLINICAL DATA:  Shoulder pain EXAM: CT HEAD WITHOUT CONTRAST CT CERVICAL SPINE WITHOUT CONTRAST TECHNIQUE:  Multidetector CT imaging of the head and cervical spine was performed following the standard protocol without intravenous contrast. Multiplanar CT image reconstructions of the cervical spine were also generated. RADIATION DOSE REDUCTION: This exam was performed according to the departmental dose-optimization program which includes automated exposure control, adjustment of the mA and/or kV according to patient size and/or use of iterative reconstruction technique. COMPARISON:  11/25/2023 FINDINGS: CT HEAD FINDINGS Brain: There is no mass, hemorrhage or extra-axial collection. The size and configuration of the ventricles and extra-axial CSF spaces are normal. The brain parenchyma is normal, without evidence of acute or chronic infarction. Vascular: No abnormal hyperdensity of the major intracranial arteries or dural venous sinuses. No intracranial atherosclerosis. Skull: The visualized skull base, calvarium and extracranial soft tissues are normal. Sinuses/Orbits: No fluid levels or advanced mucosal thickening of the visualized paranasal sinuses. No mastoid or middle ear effusion. The orbits are normal. CT CERVICAL SPINE FINDINGS Alignment: No static subluxation. Facets are aligned. Occipital condyles are normally positioned. Skull base and vertebrae: No acute fracture.  C5-7 ACDF Soft tissues and spinal canal: No prevertebral fluid or swelling. No visible canal hematoma. Disc levels: No advanced spinal canal or neural foraminal stenosis. Upper chest: No pneumothorax, pulmonary nodule or pleural effusion. Other: Normal visualized paraspinal cervical soft tissues. IMPRESSION: 1. No acute intracranial abnormality. 2. No acute fracture or static subluxation of the cervical spine. 3. C5-7 ACDF without hardware complication. Electronically Signed   By: Deatra Robinson M.D.   On: 12/10/2023 01:21   CT Cervical Spine Wo Contrast Result Date: 12/10/2023 CLINICAL DATA:  Shoulder pain EXAM: CT HEAD WITHOUT CONTRAST CT  CERVICAL SPINE WITHOUT CONTRAST TECHNIQUE: Multidetector CT imaging of the head and cervical spine was performed following the standard protocol without intravenous contrast. Multiplanar CT image reconstructions of the cervical spine were also generated. RADIATION DOSE REDUCTION: This exam was performed according to the departmental dose-optimization program which includes automated exposure control, adjustment of the mA  and/or kV according to patient size and/or use of iterative reconstruction technique. COMPARISON:  11/25/2023 FINDINGS: CT HEAD FINDINGS Brain: There is no mass, hemorrhage or extra-axial collection. The size and configuration of the ventricles and extra-axial CSF spaces are normal. The brain parenchyma is normal, without evidence of acute or chronic infarction. Vascular: No abnormal hyperdensity of the major intracranial arteries or dural venous sinuses. No intracranial atherosclerosis. Skull: The visualized skull base, calvarium and extracranial soft tissues are normal. Sinuses/Orbits: No fluid levels or advanced mucosal thickening of the visualized paranasal sinuses. No mastoid or middle ear effusion. The orbits are normal. CT CERVICAL SPINE FINDINGS Alignment: No static subluxation. Facets are aligned. Occipital condyles are normally positioned. Skull base and vertebrae: No acute fracture.  C5-7 ACDF Soft tissues and spinal canal: No prevertebral fluid or swelling. No visible canal hematoma. Disc levels: No advanced spinal canal or neural foraminal stenosis. Upper chest: No pneumothorax, pulmonary nodule or pleural effusion. Other: Normal visualized paraspinal cervical soft tissues. IMPRESSION: 1. No acute intracranial abnormality. 2. No acute fracture or static subluxation of the cervical spine. 3. C5-7 ACDF without hardware complication. Electronically Signed   By: Deatra Robinson M.D.   On: 12/10/2023 01:21   DG Chest 2 View Result Date: 12/10/2023 CLINICAL DATA:  Dyspnea EXAM: CHEST - 2  VIEW COMPARISON:  11/25/2023 FINDINGS: Opacification of the lung bases bilaterally relates to overlying soft tissue and underpenetration. Lungs are clear. No pneumothorax or pleural effusion. Stable cardiomegaly. Pulmonary vascularity is normal. No acute bone abnormality. IMPRESSION: 1. Cardiomegaly. No active cardiopulmonary disease. Electronically Signed   By: Helyn Numbers M.D.   On: 12/10/2023 00:47    Procedures Procedures  {Document cardiac monitor, telemetry assessment procedure when appropriate:1}  Medications Ordered in ED Medications  oxyCODONE (Oxy IR/ROXICODONE) immediate release tablet 5 mg (has no administration in time range)    ED Course/ Medical Decision Making/ A&P   {   Click here for ABCD2, HEART and other calculatorsREFRESH Note before signing :1}                              Medical Decision Making  67 year old female presenting for chronic paresthesias in the left upper extremity as well as right shoulder pain for the last 3 weeks.  Patient is alert noted x 3, no acute distress, afebrile, stable vital signs.  No neurovascular deficits.  Tenderness to palpation of the right AC.  Likely musculoskeletal.  Pain medication given.  EKG interpreted by myself is stable with normal sinus rhythm.  Laboratory studies are stable including stable troponins.  Pain is likely musculoskeletal.  Doubt ACS.  Patient's left upper extremity paresthesias have been present for months and is currently being followed by her primary care physician.  She has had a history of cervical disc fusion.  CT cervical spine today demonstrates no acute fracture or subluxation of the cervical spine.  No hardware complications see 5 through C7 ACDF.  Patient recommended for follow-up with primary care physician for order of outpatient MRI if paresthesias continue.  Patient given medication in the emergency department.  She is in a pain contract with her pain clinic so no prescriptions were  provided.  Patient in no distress and overall condition improved here in the ED. Detailed discussions were had with the patient regarding current findings, and need for close f/u with PCP or on call doctor. The patient has been instructed to return immediately if the symptoms  worsen in any way for re-evaluation. Patient verbalized understanding and is in agreement with current care plan. All questions answered prior to discharge.   {Document critical care time when appropriate:1} {Document review of labs and clinical decision tools ie heart score, Chads2Vasc2 etc:1}  {Document your independent review of radiology images, and any outside records:1} {Document your discussion with family members, caretakers, and with consultants:1} {Document social determinants of health affecting pt's care:1} {Document your decision making why or why not admission, treatments were needed:1} Final Clinical Impression(s) / ED Diagnoses Final diagnoses:  None    Rx / DC Orders ED Discharge Orders     None

## 2023-12-10 NOTE — Discharge Instructions (Signed)
Please follow-up with orthopedic surgery team for right sided shoulder pain.  2 different offices with phone numbers have been provided in your discharge summary.  As for your pins and needle sensation in the left hand.  This is likely nerve pain.  Please follow with your primary care physician for outpatient ordering of an MRI of your cervical spine.

## 2023-12-11 ENCOUNTER — Emergency Department (HOSPITAL_COMMUNITY): Payer: 59

## 2023-12-11 LAB — CBC WITH DIFFERENTIAL/PLATELET
Abs Immature Granulocytes: 0.02 10*3/uL (ref 0.00–0.07)
Basophils Absolute: 0.1 10*3/uL (ref 0.0–0.1)
Basophils Relative: 1 %
Eosinophils Absolute: 0.3 10*3/uL (ref 0.0–0.5)
Eosinophils Relative: 3 %
HCT: 33.4 % — ABNORMAL LOW (ref 36.0–46.0)
Hemoglobin: 11.1 g/dL — ABNORMAL LOW (ref 12.0–15.0)
Immature Granulocytes: 0 %
Lymphocytes Relative: 29 %
Lymphs Abs: 2.7 10*3/uL (ref 0.7–4.0)
MCH: 30.4 pg (ref 26.0–34.0)
MCHC: 33.2 g/dL (ref 30.0–36.0)
MCV: 91.5 fL (ref 80.0–100.0)
Monocytes Absolute: 0.8 10*3/uL (ref 0.1–1.0)
Monocytes Relative: 9 %
Neutro Abs: 5.6 10*3/uL (ref 1.7–7.7)
Neutrophils Relative %: 58 %
Platelets: 273 10*3/uL (ref 150–400)
RBC: 3.65 MIL/uL — ABNORMAL LOW (ref 3.87–5.11)
RDW: 13.4 % (ref 11.5–15.5)
WBC: 9.5 10*3/uL (ref 4.0–10.5)
nRBC: 0 % (ref 0.0–0.2)

## 2023-12-11 LAB — BASIC METABOLIC PANEL
Anion gap: 6 (ref 5–15)
BUN: 12 mg/dL (ref 8–23)
CO2: 27 mmol/L (ref 22–32)
Calcium: 8.8 mg/dL — ABNORMAL LOW (ref 8.9–10.3)
Chloride: 104 mmol/L (ref 98–111)
Creatinine, Ser: 0.99 mg/dL (ref 0.44–1.00)
GFR, Estimated: >60 mL/min (ref >60)
Glucose, Bld: 93 mg/dL (ref 70–99)
Potassium: 3.3 mmol/L — ABNORMAL LOW (ref 3.5–5.1)
Sodium: 137 mmol/L (ref 135–145)

## 2023-12-11 LAB — BRAIN NATRIURETIC PEPTIDE: B Natriuretic Peptide: 25.2 pg/mL (ref 0.0–100.0)

## 2023-12-11 MED ORDER — OXYCODONE-ACETAMINOPHEN 5-325 MG PO TABS
1.0000 | ORAL_TABLET | Freq: Four times a day (QID) | ORAL | Status: DC | PRN
  Administered 2023-12-12: 1 via ORAL
  Filled 2023-12-11 (×2): qty 1

## 2023-12-11 MED ORDER — IBUPROFEN 800 MG PO TABS
800.0000 mg | ORAL_TABLET | Freq: Once | ORAL | Status: DC
  Filled 2023-12-11: qty 1

## 2023-12-11 MED ORDER — OXYCODONE-ACETAMINOPHEN 5-325 MG PO TABS
1.0000 | ORAL_TABLET | Freq: Once | ORAL | Status: AC
  Administered 2023-12-11: 1 via ORAL
  Filled 2023-12-11: qty 1

## 2023-12-11 NOTE — ED Notes (Signed)
 Patient resting in bed breathing eyes closed

## 2023-12-11 NOTE — ED Notes (Addendum)
Patient C collar removed as requested by Provider Sponseller

## 2023-12-11 NOTE — Discharge Instructions (Addendum)
Return to the ER if you develop new or worsening or uncontrolled pain, fever, weakness, or any other new/concerning symptoms.

## 2023-12-11 NOTE — ED Notes (Signed)
Changed pt brief, pt resting

## 2023-12-11 NOTE — ED Notes (Signed)
Patient resting in bed breathing eyes close

## 2023-12-11 NOTE — Evaluation (Signed)
Physical Therapy Evaluation Patient Details Name: Maria [name] MRN: [id number] DOB: 04/25/1956 Today's Date: 12/11/2023  History of Present Illness  67 yo  female brought to ED via EMS after fall at home.   Larey Seat and hit her knee on the bedside table, hit her head on the carpeted ground.  No LOC. c/o pain  neck, bilateral knees right greater than left, right hip.  Chronic shoulder pain paresthesias in the left arm for which she was seen in [facility] ED earlier this morning. xrays knees negative. Cspine  xray =C4-5 DDD. CT head negative Patient was recently admitted and had thorough workup including MRI.   PMH: hypertension, GERD, chronic low back pain, morbid obesity, type 2 DM, heart failure, ACDF Cspine C5-6,C6-7  Clinical Impression  Pt admitted with above diagnosis.  Pt with multiple recent admissions, 5 in last 6 mos. Pt is able to stand and take lateral steps along EOB today, unable to progress gait d/t bil LE pain with L knee > R knee at time of PT eval. Pt is very fearful of falling.  Patient will benefit from continued follow up therapy, <3 hours/day at d/c If home, pt would need HHPT/OT and non-emergent ambulance transport at this time as she would not be able to navigate steps to her apt. If pt  d/c's home would anticipate readmission, defer to [facility] as pt was in rehab earlier this year.   Continue to follow   Pt currently with functional limitations due to the deficits listed below (see PT Problem List). Pt will benefit from acute skilled PT to increase their independence and safety with mobility to allow discharge.           If plan is discharge home, recommend the following: A little help with walking and/or transfers;A little help with bathing/dressing/bathroom;Assistance with cooking/housework;Assist for transportation;Help with stairs or ramp for entrance   Can travel by private vehicle   No    Equipment Recommendations None recommended by PT  Recommendations for Other Services        Functional Status Assessment Patient has had a recent decline in their functional status and demonstrates the ability to make significant improvements in function in a reasonable and predictable amount of time.     Precautions / Restrictions Precautions Precautions: Fall Restrictions Weight Bearing Restrictions Per Provider Order: No      Mobility  Bed Mobility Overal bed mobility: Needs Assistance Bed Mobility: Supine to Sit, Sit to Supine     Supine to sit: HOB elevated, Contact guard Sit to supine: Min assist   General bed mobility comments: assist to progress LEs on to stretcher. incr time and effort    Transfers Overall transfer level: Needs assistance   Transfers: Sit to/from Stand Sit to Stand: Contact guard assist, Min assist           General transfer comment: STS x2, min assist first trial, CGA 2nd trial.  incr time. pt very fearful of falling. cues for foot position and hand placement    Ambulation/Gait             Pre-gait activities: marching--ltd ROM knees and hips, reliant on RW General Gait Details: lateral steps along edge of stretcher only, pt unable to amb d/t LE pain  Stairs            Wheelchair Mobility     Tilt Bed    Modified Rankin (Stroke Patients Only)       Balance Overall balance assessment: Needs assistance,  History of Falls Sitting-balance support: No upper extremity supported Sitting balance-Leahy Scale: Fair     Standing balance support: Bilateral upper extremity supported, Reliant on assistive device for balance Standing balance-Leahy Scale: Poor                               Pertinent Vitals/Pain Pain Assessment Pain Assessment: Faces Faces Pain Scale: Hurts whole lot Pain Location: bil LEs Pain Descriptors / Indicators: Throbbing Pain Intervention(s): Limited activity within patient's tolerance, Monitored during session, Repositioned    Home Living Family/patient expects to be  discharged to:: Private residence Living Arrangements: Alone Available Help at Discharge: Other (Comment);Available PRN/intermittently (sister and niece-ltd availability) Type of Home: Apartment Home Access: Stairs to enter   Entrance Stairs-Number of Steps: stairs spaced out from parking lot to door   Home Layout: One level Home Equipment: Hospital bed;Tub bench;Rolling Walker (2 wheels);Rollator (4 wheels);BSC/3in1      Prior Function Prior Level of Function : History of Falls (last six months);Independent/Modified Independent             Mobility Comments: Uses a RW in home;Rollator when she goes out; cannot fit RW through bathroom door. holds onto wall, sink       Extremity/Trunk Assessment   Upper Extremity Assessment Upper Extremity Assessment: Defer to OT evaluation    Lower Extremity Assessment Lower Extremity Assessment: Generalized weakness RLE Deficits / Details: distal LE edematous, erythematous; limited ROM d/t body habitus; strength grossly 2+/5 LLE Deficits / Details: distal LE edematous, erythematous; limited ROM d/t body habitus; strength grossly 2+/5    Cervical / Trunk Assessment Cervical / Trunk Assessment: Neck Surgery Cervical / Trunk Exceptions: some limitations due to body habitus  Communication   Communication Communication: No apparent difficulties  Cognition Arousal: Alert Behavior During Therapy: WFL for tasks assessed/performed Overall Cognitive Status: Within Functional Limits for tasks assessed                                          General Comments      Exercises     Assessment/Plan    PT Assessment Patient needs continued PT services  PT Problem List Decreased strength;Decreased range of motion;Decreased activity tolerance;Decreased balance;Decreased mobility;Decreased knowledge of use of DME;Cardiopulmonary status limiting activity;Decreased cognition;Decreased safety awareness;Obesity       PT Treatment  Interventions DME instruction;Therapeutic exercise;Gait training;Balance training;Functional mobility training;Therapeutic activities;Patient/family education    PT Goals (Current goals can be found in the Care Plan section)  Acute Rehab PT Goals Patient Stated Goal: none stated, fearful of going home PT Goal Formulation: With patient Time For Goal Achievement: 12/25/23 Potential to Achieve Goals: Fair    Frequency Min 1X/week     Co-evaluation               AM-PAC PT "6 Clicks" Mobility  Outcome Measure Help needed turning from your back to your side while in a flat bed without using bedrails?: A Little Help needed moving from lying on your back to sitting on the side of a flat bed without using bedrails?: A Little Help needed moving to and from a bed to a chair (including a wheelchair)?: A Lot Help needed standing up from a chair using your arms (e.g., wheelchair or bedside chair)?: A Little Help needed to walk in hospital room?: A Lot Help needed  climbing 3-5 steps with a railing? : Total 6 Click Score: 14    End of Session Equipment Utilized During Treatment: Gait belt Activity Tolerance: Patient tolerated treatment well Patient left: in bed;with call bell/phone within reach   PT Visit Diagnosis: Muscle weakness (generalized) (M62.81);Difficulty in walking, not elsewhere classified (R26.2);History of falling (Z91.81)    Time: 7829-5621 PT Time Calculation (min) (ACUTE ONLY): 18 min   Charges:   PT Evaluation $PT Eval Low Complexity: 1 Low   PT General Charges $$ ACUTE PT VISIT: 1 Visit         Thamas Appleyard, PT  Acute Rehab Dept [facility]) [phone]  12/11/2023   [facility] 12/11/2023, 12:53 PM

## 2023-12-11 NOTE — ED Notes (Signed)
Nurse to ambulate patient  with walker at this time  and it was not successful. Patient barely took two baby steps. Provider aware. Patient assisted back in bed.

## 2023-12-11 NOTE — ED Provider Notes (Signed)
Patient awake being PT evaluation   Bethann Berkshire, MD 12/11/23 [phone]

## 2023-12-12 ENCOUNTER — Encounter (HOSPITAL_COMMUNITY): Payer: Self-pay

## 2023-12-12 MED ORDER — PANTOPRAZOLE SODIUM 40 MG PO TBEC
40.0000 mg | DELAYED_RELEASE_TABLET | Freq: Every day | ORAL | Status: DC
  Administered 2023-12-12: 40 mg via ORAL
  Filled 2023-12-12: qty 1

## 2023-12-12 MED ORDER — METOPROLOL SUCCINATE ER 25 MG PO TB24
25.0000 mg | ORAL_TABLET | Freq: Every day | ORAL | Status: DC
  Administered 2023-12-12: 25 mg via ORAL
  Filled 2023-12-12: qty 1

## 2023-12-12 MED ORDER — ONDANSETRON 4 MG PO TBDP: 4.0000 mg | ORAL_TABLET | ORAL | Status: DC | PRN

## 2023-12-12 MED ORDER — SPIRONOLACTONE 25 MG PO TABS
25.0000 mg | ORAL_TABLET | Freq: Every day | ORAL | Status: DC
  Administered 2023-12-12: 25 mg via ORAL
  Filled 2023-12-12: qty 1

## 2023-12-12 MED ORDER — ACETAMINOPHEN 325 MG PO TABS: 975.0000 mg | ORAL_TABLET | Freq: Three times a day (TID) | ORAL | Status: DC | PRN

## 2023-12-12 MED ORDER — CYCLOBENZAPRINE HCL 5 MG PO TABS: 7.5000 mg | ORAL_TABLET | Freq: Three times a day (TID) | ORAL | Status: DC | PRN

## 2023-12-12 MED ORDER — CLONAZEPAM 0.5 MG PO TABS: 1.0000 mg | ORAL_TABLET | Freq: Two times a day (BID) | ORAL | Status: DC | PRN

## 2023-12-12 MED ORDER — TORSEMIDE 20 MG PO TABS
40.0000 mg | ORAL_TABLET | Freq: Every day | ORAL | Status: DC
  Administered 2023-12-12: 40 mg via ORAL
  Filled 2023-12-12: qty 2

## 2023-12-12 MED ORDER — DULOXETINE HCL 20 MG PO CPEP: 20.0000 mg | ORAL_CAPSULE | Freq: Every day | ORAL | Status: DC

## 2023-12-12 MED ORDER — NALOXONE HCL 4 MG/0.1ML NA LIQD
0.4000 mg | Freq: Every day | NASAL | Status: DC | PRN
Start: 2023-12-12 — End: 2023-12-12

## 2023-12-12 MED ORDER — PSYLLIUM 95 % PO PACK: 1.0000 | PACK | Freq: Every day | ORAL | Status: DC

## 2023-12-12 MED ORDER — FLUTICASONE PROPIONATE 50 MCG/ACT NA SUSP
1.0000 | Freq: Every day | NASAL | Status: DC
  Administered 2023-12-12: 1 via NASAL
  Filled 2023-12-12: qty 16

## 2023-12-12 MED ORDER — VITAMIN B-12 1000 MCG PO TABS
1000.0000 ug | ORAL_TABLET | Freq: Every day | ORAL | Status: DC
  Administered 2023-12-12: 1000 ug via ORAL
  Filled 2023-12-12: qty 1

## 2023-12-12 MED ORDER — OXYCODONE HCL 5 MG PO TABS
10.0000 mg | ORAL_TABLET | Freq: Two times a day (BID) | ORAL | Status: DC
  Administered 2023-12-12: 10 mg via ORAL
  Filled 2023-12-12: qty 2

## 2023-12-12 MED ORDER — VITAMIN D 25 MCG (1000 UNIT) PO TABS
1000.0000 [IU] | ORAL_TABLET | Freq: Every day | ORAL | Status: DC
  Administered 2023-12-12: 1000 [IU] via ORAL
  Filled 2023-12-12: qty 1

## 2023-12-12 MED ORDER — ALBUTEROL SULFATE HFA 108 (90 BASE) MCG/ACT IN AERS: 1.0000 | INHALATION_SPRAY | Freq: Four times a day (QID) | RESPIRATORY_TRACT | Status: DC | PRN

## 2023-12-12 MED ORDER — FAMOTIDINE 20 MG PO TABS
40.0000 mg | ORAL_TABLET | Freq: Every day | ORAL | Status: DC
  Administered 2023-12-12: 40 mg via ORAL
  Filled 2023-12-12: qty 2

## 2023-12-12 NOTE — ED Notes (Signed)
Called [facility] 

## 2023-12-12 NOTE — [place] FL2 (Signed)
[place] MEDICAID FL2 LEVEL OF CARE FORM     IDENTIFICATION  Patient Name: Maria [name] Birthdate: [birth year] Sex: female Admission Date (Current Location): 12/10/2023  [facility] and [place] Number:  Producer, television/film/video and Address:  [facility],  [address], [place] [address]      Provider Number: [id number]  Attending Physician Name and Address:  System, Provider Not In  Relative Name and Phone Number:       Current Level of Care: Hospital Recommended Level of Care: Skilled Nursing Facility Prior Approval Number:    Date Approved/Denied: 12/12/23 PASRR Number: [id number]  Discharge Plan: SNF    Current Diagnoses: Patient Active Problem List   Diagnosis Date Noted   Pressure injury of skin 11/26/2023   Hallucinations 09/02/2023   Stasis dermatitis of both legs 08/30/2023   Hypomagnesemia 08/02/2023   Ankle pain, right 08/01/2023   Weakness of both lower extremities 07/30/2023   Morbid obesity (HCC) 07/30/2023   (HFpEF) heart failure with preserved ejection fraction (HCC) 07/15/2023   Abdominal pain 07/15/2023   Acute renal failure superimposed on stage 3a chronic kidney disease (HCC) 07/15/2023   Acute on chronic respiratory failure with hypoxia and hypercapnia (HCC) 02/08/2023   Pneumonia due to infectious organism 02/05/2023   Chronic right-sided congestive heart failure (HCC) 02/04/2023   Fall 02/02/2023   Physical deconditioning 07/22/2022   Fluid overload 07/21/2022   Acute cystitis with hematuria    Dyspnea 12/19/2021   Leg swelling 11/20/2021   Bilateral knee pain 10/29/2021   Hypernatremia 09/24/2021   Cellulitis of lower extremity 09/24/2021   CKD (chronic kidney disease), stage IIIa (HCC) 09/24/2021   Sacral decubitus ulcer, stage II (HCC) 09/20/2021   Acute on chronic heart failure with preserved ejection fraction (HFpEF) (HCC) 09/19/2021   Nail disorder 07/07/2021   Pre-ulcerative corn or callous 07/07/2021   Rash  07/07/2021   Chronic diastolic CHF (congestive heart failure) (HCC) 03/17/2021   Type 2 diabetes mellitus with stage 3a chronic kidney disease, without long-term current use of insulin (HCC) 03/11/2021   Vitamin D deficiency 01/01/2021   Medial epicondylitis 12/31/2020   Aortic atherosclerosis (HCC) 09/26/2020   Umbilical hernia 09/26/2020   Constipation 08/05/2020   Arthritis 06/12/2020   Hoarseness 05/01/2020   B12 deficiency 01/31/2020   Rosacea 01/31/2020   Neck swelling 01/31/2020   History of colonic polyps    Benign neoplasm of Min    Nausea 08/31/2019   Throat pain 07/26/2019   HLD (hyperlipidemia) 05/24/2019   Leg cramps 05/24/2019   Lump in neck 05/24/2019   Left thyroid nodule 01/25/2019   Jerking movements of extremities 07/20/2018   Balance disorder 07/20/2018   Right knee pain 03/21/2018   OSA (obstructive sleep apnea) 03/21/2018   Oxygen desaturation 03/21/2018   Urinary symptom or sign 03/21/2018   Lymphedema 01/25/2018   Gait disorder 01/25/2018   Dysphonia 12/07/2017   Encounter for well adult exam with abnormal findings 11/08/2017   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   Cobarrubias polyp 08/11/2016   Abdominal pain, epigastric    Dysphagia    [place] cancer screening    ACE-inhibitor cough 12/05/2015   Restless legs syndrome 10/08/2015   Venous stasis dermatitis of both lower extremities 04/26/2015   GERD without esophagitis 10/17/2014   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/17/2014   AR (allergic rhinitis) 10/17/2014   Fatty liver 10/17/2014   Chronic pain syndrome 09/19/2014   Post-traumatic osteoarthritis of both knees 09/19/2014   Spinal stenosis 09/19/2014  Depression, major, single episode, complete remission (HCC) 09/19/2014   Narcotic dependence (HCC) 09/19/2014   Acute hypoxic respiratory failure (HCC) 08/20/2014   False positive HIV serology 06/02/2013   Hypokalemia 06/29/2012   Spondylosis of lumbar region without myelopathy or  radiculopathy 03/09/2012   Lumbar disc disease 03/09/2012   Morbid obesity with body mass index (BMI) of 50.0 to 59.9 in adult [facility]) 06/17/2011   Chronic low back pain 12/28/2008   Anxiety state 12/06/2007   Depression 12/06/2007   HTN (hypertension) 12/06/2007   Gastroesophageal reflux disease without esophagitis 12/06/2007   LOW BACK PAIN 12/06/2007    Orientation RESPIRATION BLADDER Height & Weight     Self, Time, Situation, Place  Normal Continent Weight:   Height:     BEHAVIORAL SYMPTOMS/MOOD NEUROLOGICAL BOWEL NUTRITION STATUS      Continent Diet (Regular)  AMBULATORY STATUS COMMUNICATION OF NEEDS Skin   Limited Assist Verbally Normal                       Personal Care Assistance Level of Assistance  Bathing, Feeding, Dressing Bathing Assistance: Limited assistance Feeding assistance: Independent Dressing Assistance: Limited assistance     Functional Limitations Info  Sight, Hearing, Speech Sight Info: Impaired (Glasses) Hearing Info: Adequate Speech Info: Adequate    SPECIAL CARE FACTORS FREQUENCY  PT (By licensed PT), OT (By licensed OT)     PT Frequency: X5 OT Frequency: X5            Contractures Contractures Info: Not present    Additional Factors Info  Code Status Code Status Info: FULL             Current Medications (12/12/2023):  This is the current hospital active medication list Current Facility-Administered Medications  Medication Dose Route Frequency Provider Last Rate Last Admin   acetaminophen (TYLENOL) tablet 975 mg  975 mg Oral Q8H PRN Pricilla Loveless, MD       albuterol (VENTOLIN HFA) 108 (90 Base) MCG/ACT inhaler 1 puff  1 puff Inhalation Q6H PRN Pricilla Loveless, MD       cholecalciferol (VITAMIN D3) 25 MCG (1000 UNIT) tablet 1,000 Units  1,000 Units Oral Daily Pricilla Loveless, MD   1,000 Units at 12/12/23 1055   clonazePAM (KLONOPIN) tablet 1 mg  1 mg Oral BID PRN Pricilla Loveless, MD       cyanocobalamin (VITAMIN B12)  tablet 1,000 mcg  1,000 mcg Oral Daily Pricilla Loveless, MD   1,000 mcg at 12/12/23 1055   cyclobenzaprine (FLEXERIL) tablet 7.5 mg  7.5 mg Oral TID PRN Pricilla Loveless, MD       famotidine (PEPCID) tablet 40 mg  40 mg Oral Daily Pricilla Loveless, MD   40 mg at 12/12/23 1055   fluticasone (FLONASE) 50 MCG/ACT nasal spray 1 spray  1 spray Each Nare Daily Pricilla Loveless, MD       metoprolol succinate (TOPROL-XL) 24 hr tablet 25 mg  25 mg Oral Daily Pricilla Loveless, MD   25 mg at 12/12/23 1055   naloxone (NARCAN) nasal spray 4 mg/0.1 mL  0.4 mg Nasal Daily PRN Pricilla Loveless, MD       ondansetron (ZOFRAN-ODT) disintegrating tablet 4 mg  4 mg Oral PRN Pricilla Loveless, MD       oxyCODONE (Oxy IR/ROXICODONE) immediate release tablet 10 mg  10 mg Oral BID Pricilla Loveless, MD       oxyCODONE-acetaminophen (PERCOCET/ROXICET) 5-325 MG per tablet 1 tablet  1 tablet Oral Q6H PRN  Rondel Baton, MD   1 tablet at 12/12/23 6644   pantoprazole (PROTONIX) EC tablet 40 mg  40 mg Oral Daily Pricilla Loveless, MD   40 mg at 12/12/23 1055   psyllium (HYDROCIL/METAMUCIL) 1 packet  1 packet Oral QHS Pricilla Loveless, MD       spironolactone (ALDACTONE) tablet 25 mg  25 mg Oral Daily Pricilla Loveless, MD       torsemide [facility]) tablet 40 mg  40 mg Oral Daily Pricilla Loveless, MD   40 mg at 12/12/23 1055   Current Outpatient Medications  Medication Sig Dispense Refill   acetaminophen (TYLENOL) 650 MG CR tablet Take 1,300 mg by mouth every 8 (eight) hours as needed for pain.     albuterol (VENTOLIN HFA) 108 (90 Base) MCG/ACT inhaler Inhale 1 puff into the lungs every 6 (six) hours as needed for wheezing or shortness of breath.     cholecalciferol (VITAMIN D3) 25 MCG (1000 UNIT) tablet Take 1,000 Units by mouth daily.     clonazePAM (KLONOPIN) 1 MG tablet Take 1 tablet (1 mg total) by mouth 2 (two) times daily as needed. for anxiety (Patient taking differently: Take 1 mg by mouth 2 (two) times daily.) 60 tablet 0    cyanocobalamin 1000 MCG tablet Take 1 tablet (1,000 mcg total) by mouth daily. 30 tablet 0   cyclobenzaprine (FEXMID) 7.5 MG tablet Take 7.5 mg by mouth 3 (three) times daily as needed for muscle spasms.     famotidine (PEPCID) 40 MG tablet Take 40 mg by mouth daily.     fluticasone (FLONASE) 50 MCG/ACT nasal spray Place 1 spray into both nostrils daily.     Metamucil Fiber CHEW Chew 3 tablets by mouth at bedtime.     metoprolol succinate (TOPROL-XL) 25 MG 24 hr tablet Take 1 tablet (25 mg total) by mouth daily. 30 tablet 0   NARCAN 4 MG/0.1ML LIQD nasal spray kit Place 0.4 mg into the nose daily as needed (opioid overdose).     ondansetron (ZOFRAN-ODT) 4 MG disintegrating tablet Take 4 mg by mouth as needed for nausea or vomiting.     Oxycodone HCl 10 MG TABS Take 1 tablet (10 mg total) by mouth in the morning and at bedtime.     pantoprazole (PROTONIX) 40 MG tablet TAKE ONE TABLET BY MOUTH DAILY 90 tablet 2   Potassium Chloride ER 20 MEQ TBCR Take 40 mEq by mouth in the morning and at bedtime.     spironolactone (ALDACTONE) 25 MG tablet Take 0.5 tablets (12.5 mg total) by mouth daily. (Patient taking differently: Take 25 mg by mouth daily.) 90 tablet 1   tirzepatide (MOUNJARO) 15 MG/0.5ML Pen Inject 15 mg into the skin once a week.     torsemide (DEMADEX) 20 MG tablet Take 2 tablets (40 mg total) by mouth daily.     DULoxetine (CYMBALTA) 20 MG capsule Take 20 mg by mouth at bedtime. (Patient not taking: Reported on 12/12/2023)       Discharge Medications: Please see discharge summary for a list of discharge medications.  Relevant Imaging Results:  Relevant Lab Results:   Additional Information [id number]  Georgie Chard, LCSW

## 2023-12-12 NOTE — Progress Notes (Addendum)
Transition of Care [facility]) - Emergency Department Mini Assessment   Patient Details  Name: Maria [name] MRN: [id number] Date of Birth: [birth year]  [facility]) CM/SW Contact:    Georgie Chard, LCSW Phone Number: 12/12/2023, 11:18 AM   Clinical Narrative: Addend @ 11:51 AM  Star has responded, at this time [facility] will review patient.   Addend@ 11:33 am  This CSW has reached out to Star at [facility] to see about reviewing patient. N/A CSW has left secure text to contact CSW in regards to patient. TOC will continue to update.  CSW spoke to patient, at this time the patient would like to go to a SNF to regain strength. CSW has explained SNF process to patient CSW also explained that through this process TOC will continue to reach out to patient in regards to placement. This CSW has sent out FL2 to facilites. Patient preferred facility is [facility]. CSW has made patient aware that [facility] will reach out to see if [facility] may review. TOC will continue to follow patient for SNF placement.    ED Mini Assessment: What brought you to the Emergency Department? : (P) Patient continues to have frequent falls  Barriers to Discharge: (P) No Barriers Identified  Barrier interventions: (P) SNF PLACMENT  Means of departure: (P) Ambulance  Interventions which prevented an admission or readmission: (P) SNF Placement    Patient Contact and Communications     Spoke with: (P) Patient Contact Date: (P) 12/12/23,   Contact time: (P) 1100 Contact Phone Number: (P) [phone]    Patient states their goals for this hospitalization and ongoing recovery are:: (P) Patient would like to rehab to regain strength. CMS Medicare.gov Compare Post Acute Care list provided to:: (P) Patient Choice offered to / list presented to : (P) Patient  Admission diagnosis:  Fall; Rt Knee Pain Patient Active Problem List   Diagnosis Date Noted   Pressure injury of skin 11/26/2023    Hallucinations 09/02/2023   Stasis dermatitis of both legs 08/30/2023   Hypomagnesemia 08/02/2023   Ankle pain, right 08/01/2023   Weakness of both lower extremities 07/30/2023   Morbid obesity (HCC) 07/30/2023   (HFpEF) heart failure with preserved ejection fraction (HCC) 07/15/2023   Abdominal pain 07/15/2023   Acute renal failure superimposed on stage 3a chronic kidney disease (HCC) 07/15/2023   Acute on chronic respiratory failure with hypoxia and hypercapnia (HCC) 02/08/2023   Pneumonia due to infectious organism 02/05/2023   Chronic right-sided congestive heart failure (HCC) 02/04/2023   Fall 02/02/2023   Physical deconditioning 07/22/2022   Fluid overload 07/21/2022   Acute cystitis with hematuria    Dyspnea 12/19/2021   Leg swelling 11/20/2021   Bilateral knee pain 10/29/2021   Hypernatremia 09/24/2021   Cellulitis of lower extremity 09/24/2021   CKD (chronic kidney disease), stage IIIa (HCC) 09/24/2021   Sacral decubitus ulcer, stage II (HCC) 09/20/2021   Acute on chronic heart failure with preserved ejection fraction (HFpEF) (HCC) 09/19/2021   Nail disorder 07/07/2021   Pre-ulcerative corn or callous 07/07/2021   Rash 07/07/2021   Chronic diastolic CHF (congestive heart failure) (HCC) 03/17/2021   Type 2 diabetes mellitus with stage 3a chronic kidney disease, without long-term current use of insulin (HCC) 03/11/2021   Vitamin D deficiency 01/01/2021   Medial epicondylitis 12/31/2020   Aortic atherosclerosis (HCC) 09/26/2020   Umbilical hernia 09/26/2020   Constipation 08/05/2020   Arthritis 06/12/2020   Hoarseness 05/01/2020   B12 deficiency 01/31/2020  Rosacea 01/31/2020   Neck swelling 01/31/2020   History of colonic polyps    Benign neoplasm of [place]    Nausea 08/31/2019   Throat pain 07/26/2019   HLD (hyperlipidemia) 05/24/2019   Leg cramps 05/24/2019   Lump in neck 05/24/2019   Left thyroid nodule 01/25/2019   Jerking movements of extremities 07/20/2018    Balance disorder 07/20/2018   Right knee pain 03/21/2018   OSA (obstructive sleep apnea) 03/21/2018   Oxygen desaturation 03/21/2018   Urinary symptom or sign 03/21/2018   Lymphedema 01/25/2018   Gait disorder 01/25/2018   Dysphonia 12/07/2017   Encounter for well adult exam with abnormal findings 11/08/2017   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   Schulke polyp 08/11/2016   Abdominal pain, epigastric    Dysphagia    [place] cancer screening    ACE-inhibitor cough 12/05/2015   Restless legs syndrome 10/08/2015   Venous stasis dermatitis of both lower extremities 04/26/2015   GERD without esophagitis 10/17/2014   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/17/2014   AR (allergic rhinitis) 10/17/2014   Fatty liver 10/17/2014   Chronic pain syndrome 09/19/2014   Post-traumatic osteoarthritis of both knees 09/19/2014   Spinal stenosis 09/19/2014   Depression, major, single episode, complete remission (HCC) 09/19/2014   Narcotic dependence (HCC) 09/19/2014   Acute hypoxic respiratory failure (HCC) 08/20/2014   False positive HIV serology 06/02/2013   Hypokalemia 06/29/2012   Spondylosis of 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 03/09/2012   Lumbar disc disease 03/09/2012   Morbid obesity with body mass index (BMI) of 50.0 to 59.9 in adult (HCC) 06/17/2011   Chronic low back pain 12/28/2008   Anxiety state 12/06/2007   Depression 12/06/2007   HTN (hypertension) 12/06/2007   Gastroesophageal reflux disease without esophagitis 12/06/2007   LOW BACK PAIN 12/06/2007   PCP:  Oneita Hurt, No Pharmacy:   [facility] - [place], [place] - [address] [place] [address] Phone: [phone] Fax: [phone]

## 2023-12-12 NOTE — Progress Notes (Signed)
CSW has spoke to Star from [facility], at this time the patient is currently in Co-pay days in which will total 2,040 dollars. This CSW has spoken to the patient in regards to the co-pay. AT this time the patient has reported that she does not have that kind of money. Patient has stated that she will return home and continue to use [facility] with adoration. CSW has explained to the patient that she has not has her 60 day wellness and is in 15 days of co-pay days. Patient states that she will be fine to go home with [facility]. At this time this CSW has made current RN aware to arrange transport for the patient to return home. There are no further TOC needs at this time.

## 2023-12-12 NOTE — ED Provider Notes (Signed)
Patient states she is unable to afford co-pay to go to a facility and so she would like to go back home where she has home health.  Will discharge.   Pricilla Loveless, MD 12/12/23 [phone]

## 2023-12-12 NOTE — ED Provider Notes (Signed)
Emergency Medicine Observation Re-evaluation Note  Maria [name] is a 67 y.o. female, seen on rounds today.  Pt initially presented to the ED for complaints of Fall Currently, the patient is resting.  Physical Exam  BP (!) 151/58 (BP Location: Right Arm)   Pulse 81   Temp 98.9 F (37.2 C) (Other (Comment))   Resp 16   SpO2 100%  Physical Exam General: no acute distress Lungs: normal effort Psych: no agitation  ED Course / MDM  EKG:   I have reviewed the labs performed to date as well as medications administered while in observation.  Recent changes in the last 24 hours include home meds being ordered this morning. Worked with PT yesterday.  Plan  Current plan is for [facility] consultation for placement.  Patient feels like she cannot take care of herself.    Pricilla Loveless, MD 12/12/23 [phone]
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
## 2023-12-12 NOTE — ED Notes (Signed)
Received report from previous RN. Pt found lying on stretcher. AAOX4, NAD at this time. Reports chronic back/leg pain.

## 2023-12-21 ENCOUNTER — Ambulatory Visit: Payer: 59

## 2023-12-27 ENCOUNTER — Emergency Department (HOSPITAL_COMMUNITY): Payer: 59

## 2023-12-27 ENCOUNTER — Emergency Department (HOSPITAL_COMMUNITY)
Admission: EM | Admit: 2023-12-27 | Discharge: 2023-12-28 | Disposition: A | Discharge status: Home / Self Care | Payer: 59 | Attending: Emergency Medicine | Admitting: Emergency Medicine

## 2023-12-27 DIAGNOSIS — I4892 Unspecified atrial flutter: Secondary | ICD-10-CM | POA: Insufficient documentation

## 2023-12-27 DIAGNOSIS — R5383 Other fatigue: Secondary | ICD-10-CM | POA: Insufficient documentation

## 2023-12-27 DIAGNOSIS — R531 Weakness: Secondary | ICD-10-CM | POA: Diagnosis not present

## 2023-12-27 DIAGNOSIS — D649 Anemia, unspecified: Secondary | ICD-10-CM | POA: Diagnosis not present

## 2023-12-27 DIAGNOSIS — R600 Localized edema: Secondary | ICD-10-CM | POA: Insufficient documentation

## 2023-12-27 DIAGNOSIS — D72829 Elevated white blood cell count, unspecified: Secondary | ICD-10-CM | POA: Insufficient documentation

## 2023-12-27 DIAGNOSIS — I509 Heart failure, unspecified: Secondary | ICD-10-CM | POA: Insufficient documentation

## 2023-12-27 DIAGNOSIS — Z20822 Contact with and (suspected) exposure to covid-19: Secondary | ICD-10-CM | POA: Insufficient documentation

## 2023-12-27 DIAGNOSIS — R0981 Nasal congestion: Secondary | ICD-10-CM | POA: Insufficient documentation

## 2023-12-27 DIAGNOSIS — R110 Nausea: Secondary | ICD-10-CM | POA: Diagnosis not present

## 2023-12-27 DIAGNOSIS — M170 Bilateral primary osteoarthritis of knee: Secondary | ICD-10-CM | POA: Insufficient documentation

## 2023-12-27 DIAGNOSIS — R6883 Chills (without fever): Secondary | ICD-10-CM | POA: Diagnosis not present

## 2023-12-27 DIAGNOSIS — Z91040 Latex allergy status: Secondary | ICD-10-CM | POA: Insufficient documentation

## 2023-12-27 DIAGNOSIS — M25561 Pain in right knee: Secondary | ICD-10-CM

## 2023-12-27 NOTE — ED Provider Notes (Signed)
 [place] EMERGENCY DEPARTMENT AT [facility] Provider Note   CSN: [id number] Arrival date & time: 12/27/23  2104     History  Chief Complaint  Patient presents with   Knee Pain    Maria [name] is a 68 y.o. female presents today for weakness, chills, congestion, nausea and fatigue x 1 day.  Patient states that she was so weak she could not walk today and therefore missed an appointment she was at staff today.  Patient denies fever, chest pain, vomiting, diarrhea, or abdominal pain.   Knee Pain Associated symptoms: fatigue        Home Medications Prior to Admission medications   Medication Sig Start Date End Date Taking? Authorizing Provider  acetaminophen  (TYLENOL ) 650 MG CR tablet Take 1,300 mg by mouth every 8 (eight) hours as needed for pain.    [provider]  albuterol  (VENTOLIN  HFA) 108 (90 Base) MCG/ACT inhaler Inhale 1 puff into the lungs every 6 (six) hours as needed for wheezing or shortness of breath.    [provider]  cholecalciferol  (VITAMIN D3) 25 MCG (1000 UNIT) tablet Take 1,000 Units by mouth daily.    [provider]  clonazePAM  (KLONOPIN ) 1 MG tablet Take 1 tablet (1 mg total) by mouth 2 (two) times daily as needed. for anxiety Patient taking differently: Take 1 mg by mouth 2 (two) times daily. 10/11/23   [name], MD  cyanocobalamin  1000 MCG tablet Take 1 tablet (1,000 mcg total) by mouth daily. 07/18/23   [name], MD  cyclobenzaprine  (FEXMID ) 7.5 MG tablet Take 7.5 mg by mouth 3 (three) times daily as needed for muscle spasms. 11/23/23   [provider]  DULoxetine  (CYMBALTA ) 20 MG capsule Take 20 mg by mouth at bedtime. Patient not taking: Reported on 12/12/2023    [provider]  famotidine  (PEPCID ) 40 MG tablet Take 40 mg by mouth daily. 10/04/23   [provider]  fluticasone  (FLONASE ) 50 MCG/ACT nasal spray Place 1 spray into both nostrils daily. 12/07/22   [provider]  Metamucil Fiber CHEW Chew 3 tablets by mouth at bedtime.    [provider]  metoprolol  succinate (TOPROL -XL) 25 MG 24 hr tablet Take 1 tablet (25 mg total) by mouth daily. 07/18/23   [name], MD  NARCAN  4 MG/0.1ML LIQD nasal spray kit Place 0.4 mg into the nose daily as needed (opioid overdose). 04/25/19   [provider]  ondansetron  (ZOFRAN -ODT) 4 MG disintegrating tablet Take 4 mg by mouth as needed for nausea or vomiting. 09/14/23   [provider]  Oxycodone  HCl 10 MG TABS Take 1 tablet (10 mg total) by mouth in the morning and at bedtime. 11/30/23   [name], MD  pantoprazole  (PROTONIX ) 40 MG tablet TAKE ONE TABLET BY MOUTH DAILY 01/29/22   [name], MD  Potassium Chloride  ER 20 MEQ TBCR Take 40 mEq by mouth in the morning and at bedtime. 11/16/23   [provider]  spironolactone  (ALDACTONE ) 25 MG tablet Take 0.5 tablets (12.5 mg total) by mouth daily. Patient taking differently: Take 25 mg by mouth daily. 08/14/22   [name], NP  tirzepatide [facility]) 15 MG/0.5ML Pen Inject 15 mg into the skin once a week.    [provider]  torsemide  (DEMADEX ) 20 MG tablet Take 2 tablets (40 mg total) by mouth daily. 11/30/23   [name], MD      Allergies    Amoxicillin -pot clavulanate, Adhesive [  tape], Codeine, Crestor  [rosuvastatin  calcium ], Gabapentin , Lyrica [pregabalin], Morphine  and codeine, Vicodin [hydrocodone-acetaminophen ], and Latex    Review of Systems   Review of Systems  Constitutional:  Positive for chills and fatigue.  HENT:  Positive for congestion.   Respiratory:  Positive for cough.   Gastrointestinal:  Positive for nausea.  Neurological:  Positive for weakness.    Physical Exam Updated Vital Signs BP (!) 174/72 (BP Location: Left Arm)   Pulse 94   Temp 98.1 F (36.7 C) (Oral)   Resp 19   SpO2 98%  Physical Exam Vitals and nursing note reviewed.  Constitutional:      General: She  is not in acute distress.    Appearance: She is well-developed. She is obese.  HENT:     Head: Normocephalic and atraumatic.     Right Ear: External ear normal.     Left Ear: External ear normal.     Nose: Congestion present.     Mouth/Throat:     Mouth: Mucous membranes are moist.  Eyes:     Extraocular Movements: Extraocular movements intact.     Conjunctiva/sclera: Conjunctivae normal.  Cardiovascular:     Rate and Rhythm: Normal rate and regular rhythm.     Pulses: Normal pulses.     Heart sounds: Normal heart sounds. No murmur heard. Pulmonary:     Effort: Pulmonary effort is normal. No respiratory distress.     Breath sounds: Normal breath sounds.  Abdominal:     Palpations: Abdomen is soft.     Tenderness: There is no abdominal tenderness.  Musculoskeletal:        General: No swelling.     Cervical back: Normal range of motion and neck supple.     Right lower leg: Edema present.     Left lower leg: Edema present.  Skin:    General: Skin is warm and dry.     Capillary Refill: Capillary refill takes less than 2 seconds.  Neurological:     General: No focal deficit present.     Mental Status: She is alert.     Motor: No weakness.  Psychiatric:        Mood and Affect: Mood normal.     ED Results / Procedures / Treatments   Labs (all labs ordered are listed, but only abnormal results are displayed) Labs Reviewed  RESP PANEL BY RT-PCR (RSV, FLU A&B, COVID)  [id number]  BASIC METABOLIC PANEL  CBC WITH DIFFERENTIAL/PLATELET  BRAIN NATRIURETIC PEPTIDE    EKG None  Radiology DG Chest 2 View Result Date: 12/27/2023 CLINICAL DATA:  Bilateral leg weakness and swelling EXAM: CHEST - 2 VIEW COMPARISON:  12/09/2023 FINDINGS: Stable cardiomegaly. Aortic atherosclerotic calcification. Pulmonary vascular congestion. No focal consolidation, pleural effusion, or pneumothorax. No displaced rib fractures. IMPRESSION: Cardiomegaly and pulmonary vascular congestion. Electronically  Signed   By: Norman Gatlin M.D.   On: 12/27/2023 22:31    Procedures Procedures    Medications Ordered in ED Medications - No data to display  ED Course/ Medical Decision Making/ A&P                                 Medical Decision Making Amount and/or Complexity of Data Reviewed Labs: ordered. Radiology: ordered.   This patient presents to the ED with chief complaint(s) of fatigue and weakness with pertinent past medical history of chronic pain, CHF which further complicates the presenting complaint. The complaint  involves an extensive differential diagnosis and also carries with it a high risk of complications and morbidity.    The differential diagnosis includes COVID, flu,  RSV, CHF  Additional history obtained: Records reviewed did cardiology notes  ED Course and Reassessment:   Independent labs interpretation:  The following labs were independently interpreted:  EKG: Pending CBC: Pending BMP: Pending BNP: Pending Respiratory panel: Pending  Independent visualization of imaging: - I independently visualized the following imaging with scope of interpretation limited to determining acute life threatening conditions related to emergency care: Chest x-ray, which revealed cardiomegaly and pulmonary vascular congestion  Patient signed out to Ubaldo High, PA-C at shift change pending labs which will determine  disposition of patient.        Final Clinical Impression(s) / ED Diagnoses Final diagnoses:  None    Rx / DC Orders ED Discharge Orders     None         Francis Ileana SAILOR, PA-C 12/27/23 2352    Ruthe Cornet, DO 12/27/23 2357

## 2023-12-27 NOTE — ED Triage Notes (Addendum)
 Pt BIB EMS, pt with hx of chronic arthritis , pt reports knee pain bilaterally worse in  right knee, denies fall, pain worse today 10/10. Pt with swollen legs, reports chronic. Pt in recliner, states can not bend her legs r/t pain at this time.

## 2023-12-28 ENCOUNTER — Emergency Department (HOSPITAL_COMMUNITY): Payer: 59

## 2023-12-28 LAB — CBC WITH DIFFERENTIAL/PLATELET
Abs Immature Granulocytes: 0.03 10*3/uL (ref 0.00–0.07)
Basophils Absolute: 0.1 10*3/uL (ref 0.0–0.1)
Basophils Relative: 1 %
Eosinophils Absolute: 0.2 10*3/uL (ref 0.0–0.5)
Eosinophils Relative: 2 %
HCT: 35.4 % — ABNORMAL LOW (ref 36.0–46.0)
Hemoglobin: 11.6 g/dL — ABNORMAL LOW (ref 12.0–15.0)
Immature Granulocytes: 0 %
Lymphocytes Relative: 26 %
Lymphs Abs: 2.8 10*3/uL (ref 0.7–4.0)
MCH: 30.5 pg (ref 26.0–34.0)
MCHC: 32.8 g/dL (ref 30.0–36.0)
MCV: 93.2 fL (ref 80.0–100.0)
Monocytes Absolute: 0.9 10*3/uL (ref 0.1–1.0)
Monocytes Relative: 8 %
Neutro Abs: 7 10*3/uL (ref 1.7–7.7)
Neutrophils Relative %: 63 %
Platelets: 265 10*3/uL (ref 150–400)
RBC: 3.8 MIL/uL — ABNORMAL LOW (ref 3.87–5.11)
RDW: 13.7 % (ref 11.5–15.5)
WBC: 11 10*3/uL — ABNORMAL HIGH (ref 4.0–10.5)
nRBC: 0 % (ref 0.0–0.2)

## 2023-12-28 LAB — RESP PANEL BY RT-PCR (RSV, FLU A&B, COVID)  RVPGX2
Influenza A by PCR: NEGATIVE
Influenza B by PCR: NEGATIVE
Resp Syncytial Virus by PCR: NEGATIVE
SARS Coronavirus 2 by RT PCR: NEGATIVE

## 2023-12-28 LAB — BASIC METABOLIC PANEL
Anion gap: 10 (ref 5–15)
BUN: 21 mg/dL (ref 8–23)
CO2: 25 mmol/L (ref 22–32)
Calcium: 9 mg/dL (ref 8.9–10.3)
Chloride: 99 mmol/L (ref 98–111)
Creatinine, Ser: 1.38 mg/dL — ABNORMAL HIGH (ref 0.44–1.00)
GFR, Estimated: 42 mL/min — ABNORMAL LOW (ref >60)
Glucose, Bld: 93 mg/dL (ref 70–99)
Potassium: 4.5 mmol/L (ref 3.5–5.1)
Sodium: 134 mmol/L — ABNORMAL LOW (ref 135–145)

## 2023-12-28 LAB — BRAIN NATRIURETIC PEPTIDE: B Natriuretic Peptide: 37.1 pg/mL (ref 0.0–100.0)

## 2023-12-28 MED ORDER — SODIUM CHLORIDE 0.9 % IV BOLUS: 1000.0000 mL | Freq: Once | INTRAVENOUS | Status: DC

## 2023-12-28 MED ORDER — FENTANYL CITRATE PF 50 MCG/ML IJ SOSY
50.0000 ug | PREFILLED_SYRINGE | Freq: Once | INTRAMUSCULAR | Status: AC
  Administered 2023-12-28: 50 ug via INTRAVENOUS
  Filled 2023-12-28: qty 1

## 2023-12-28 NOTE — ED Provider Notes (Signed)
  Physical Exam  BP (!) 174/72 (BP Location: Left Arm)   Pulse 94   Temp 98.1 F (36.7 C) (Oral)   Resp 19   SpO2 98%   Physical Exam  Procedures  Procedures  ED Course / MDM    Medical Decision Making Amount and/or Complexity of Data Reviewed Labs: ordered. Radiology: ordered.  Risk Prescription drug management.   Patient care assumed at shift handoff from previous provider.  See her note for full details.  In short, patient with chronic pain presents to the emergency department feeling that she may be dehydrated and complaining of severe bilateral knee pain causing difficulty with walking.  At time of assumption of care labs had not been collected chest x-ray showed cardiomegaly with pulmonary vascular congestion.  Previous provider felt this may be fluid overload and anticipated admission after lab work.  Upon my assessment patient has grossly swollen bilateral lower extremities consistent with lymphedema.  Patient endorses history of lymphedema.  She continues to endorse bilateral knee pain.  She denies any new traumas to the knee.  I ordered bilateral knee x-rays for evaluation.  She does complain of movement with passive range of motion of bilateral knees but due to overall pain and body habitus examination is difficult.  BNP is not elevated.  Patient does not complain of shortness of breath or chest pain.  Doubt fluid overload at this time.  Respiratory panel negative.  Creatinine mildly elevated at 1.38 with baseline appearing close to 1.0.  Does appear to be a mild AKI.  Fluid bolus ordered for fluid resuscitation.  Fentanyl  ordered for pain control.  Patient care being transferred at shift handoff to [facility], PA-C.  At this time barring significantly abnormal results on plain films of bilateral knees or CBC. I feel the patient would benefit from outpatient follow-up with her pain management doctor      Maria [name] 12/28/23 9358    Maria Norris, MD 12/28/23 [phone]

## 2023-12-28 NOTE — ED Provider Notes (Signed)
   Accepted handoff at shift change from PA [facility]. Please see prior provider note for more detail.   Briefly: Patient is 68 y.o. presents today for weakness, chills, congestion, nausea and fatigue x 1 day.  Patient states that she was so weak she could not walk today and therefore missed an appointment she was at staff today.  Patient denies fever, chest pain, vomiting, diarrhea, or abdominal pain.  DDX: concern for COVID, flu, RSV, CHF   Plan:  -dispo pending labs and imaging -CBC with mild leukocytosis at 11.0. there is also mild anemia with hgb 11.6. BMP with a mild bump in Cr at 1.38 - patient received IV fluids in ED and states that she is feeling better. Resp panel negative. BNP WNL. -shared results with patient who is requesting something to eat. Provided patient with sandwich and fluids which she tolerated well. Patient stating she feels better and is ready to go home. -knee xrays without acute changes. - patient afebrile with stable vitals. Provided with return precautions. Discharged in good condition.   Hoy Nidia FALCON, [place] 12/28/23 9275    [name], MD 12/30/23 1040

## 2023-12-28 NOTE — Discharge Instructions (Addendum)
 Please follow up with your pain management provider. If you develop any life threatening symptoms please return to the emergency department.

## 2023-12-28 NOTE — ED Notes (Signed)
 [place] PIV placed.

## 2023-12-29 ENCOUNTER — Other Ambulatory Visit: Payer: Self-pay

## 2023-12-29 ENCOUNTER — Emergency Department (HOSPITAL_COMMUNITY)
Admission: EM | Admit: 2023-12-29 | Discharge: 2024-01-03 | Disposition: A | Discharge status: Skilled Nursing Facility | Payer: 59 | Attending: Emergency Medicine | Admitting: Emergency Medicine

## 2023-12-29 ENCOUNTER — Encounter (HOSPITAL_COMMUNITY): Payer: Self-pay

## 2023-12-29 DIAGNOSIS — R531 Weakness: Secondary | ICD-10-CM | POA: Insufficient documentation

## 2023-12-29 DIAGNOSIS — I5032 Chronic diastolic (congestive) heart failure: Secondary | ICD-10-CM | POA: Diagnosis not present

## 2023-12-29 DIAGNOSIS — Z6841 Body Mass Index (BMI) 40.0 and over, adult: Secondary | ICD-10-CM | POA: Insufficient documentation

## 2023-12-29 DIAGNOSIS — R296 Repeated falls: Secondary | ICD-10-CM | POA: Insufficient documentation

## 2023-12-29 DIAGNOSIS — M25551 Pain in right hip: Secondary | ICD-10-CM | POA: Insufficient documentation

## 2023-12-29 DIAGNOSIS — G8929 Other chronic pain: Secondary | ICD-10-CM | POA: Insufficient documentation

## 2023-12-29 DIAGNOSIS — R2243 Localized swelling, mass and lump, lower limb, bilateral: Secondary | ICD-10-CM | POA: Diagnosis not present

## 2023-12-29 DIAGNOSIS — R059 Cough, unspecified: Secondary | ICD-10-CM | POA: Insufficient documentation

## 2023-12-29 DIAGNOSIS — N183 Chronic kidney disease, stage 3 unspecified: Secondary | ICD-10-CM | POA: Insufficient documentation

## 2023-12-29 DIAGNOSIS — E669 Obesity, unspecified: Secondary | ICD-10-CM | POA: Insufficient documentation

## 2023-12-29 DIAGNOSIS — W19XXXA Unspecified fall, initial encounter: Secondary | ICD-10-CM | POA: Insufficient documentation

## 2023-12-29 DIAGNOSIS — I5030 Unspecified diastolic (congestive) heart failure: Secondary | ICD-10-CM | POA: Insufficient documentation

## 2023-12-29 DIAGNOSIS — Z91040 Latex allergy status: Secondary | ICD-10-CM | POA: Insufficient documentation

## 2023-12-29 DIAGNOSIS — Z1152 Encounter for screening for COVID-19: Secondary | ICD-10-CM | POA: Diagnosis not present

## 2023-12-29 MED ORDER — HYDROMORPHONE HCL 1 MG/ML IJ SOLN
0.5000 mg | Freq: Once | INTRAMUSCULAR | Status: AC
  Administered 2023-12-30: 0.5 mg via INTRAVENOUS
  Filled 2023-12-29: qty 1

## 2023-12-29 NOTE — ED Triage Notes (Addendum)
 Pt BIB EMS from home after multiple falls over the last few days. Fie came out yesterday and assisted her but she refused to come to the hospital since she was just here.  EMS called out today after another fall and her being down for at least 30 mins prior to EMS arrival. C/O bilateral knee, hip, and femur pain. Pt wanting to seek SNF placement.

## 2023-12-30 ENCOUNTER — Emergency Department (HOSPITAL_COMMUNITY): Payer: 59

## 2023-12-30 LAB — BASIC METABOLIC PANEL
Anion gap: 8 (ref 5–15)
BUN: 17 mg/dL (ref 8–23)
CO2: 25 mmol/L (ref 22–32)
Calcium: 8.2 mg/dL — ABNORMAL LOW (ref 8.9–10.3)
Chloride: 101 mmol/L (ref 98–111)
Creatinine, Ser: 1.19 mg/dL — ABNORMAL HIGH (ref 0.44–1.00)
GFR, Estimated: 50 mL/min — ABNORMAL LOW (ref >60)
Glucose, Bld: 88 mg/dL (ref 70–99)
Potassium: 3.9 mmol/L (ref 3.5–5.1)
Sodium: 134 mmol/L — ABNORMAL LOW (ref 135–145)

## 2023-12-30 LAB — URINALYSIS, ROUTINE W REFLEX MICROSCOPIC
Bilirubin Urine: NEGATIVE
Glucose, UA: NEGATIVE mg/dL
Hgb urine dipstick: NEGATIVE
Ketones, ur: NEGATIVE mg/dL
Nitrite: NEGATIVE
Protein, ur: NEGATIVE mg/dL
Specific Gravity, Urine: 1.008 (ref 1.005–1.030)
pH: 6 (ref 5.0–8.0)

## 2023-12-30 LAB — CBC WITH DIFFERENTIAL/PLATELET
Abs Immature Granulocytes: 0.04 10*3/uL (ref 0.00–0.07)
Basophils Absolute: 0 10*3/uL (ref 0.0–0.1)
Basophils Relative: 0 %
Eosinophils Absolute: 0.2 10*3/uL (ref 0.0–0.5)
Eosinophils Relative: 2 %
HCT: 29.8 % — ABNORMAL LOW (ref 36.0–46.0)
Hemoglobin: 9.7 g/dL — ABNORMAL LOW (ref 12.0–15.0)
Immature Granulocytes: 0 %
Lymphocytes Relative: 27 %
Lymphs Abs: 2.7 10*3/uL (ref 0.7–4.0)
MCH: 30.3 pg (ref 26.0–34.0)
MCHC: 32.6 g/dL (ref 30.0–36.0)
MCV: 93.1 fL (ref 80.0–100.0)
Monocytes Absolute: 0.8 10*3/uL (ref 0.1–1.0)
Monocytes Relative: 9 %
Neutro Abs: 6 10*3/uL (ref 1.7–7.7)
Neutrophils Relative %: 62 %
Platelets: 233 10*3/uL (ref 150–400)
RBC: 3.2 MIL/uL — ABNORMAL LOW (ref 3.87–5.11)
RDW: 13.6 % (ref 11.5–15.5)
WBC: 9.8 10*3/uL (ref 4.0–10.5)
nRBC: 0 % (ref 0.0–0.2)

## 2023-12-30 MED ORDER — CYCLOBENZAPRINE HCL 5 MG PO TABS
7.5000 mg | ORAL_TABLET | Freq: Three times a day (TID) | ORAL | Status: DC | PRN
  Administered 2023-12-30 – 2024-01-03 (×5): 7.5 mg via ORAL
  Filled 2023-12-30 (×6): qty 1.5

## 2023-12-30 MED ORDER — FAMOTIDINE 20 MG PO TABS
40.0000 mg | ORAL_TABLET | Freq: Every day | ORAL | Status: DC
  Administered 2023-12-30 – 2024-01-03 (×5): 40 mg via ORAL
  Filled 2023-12-30 (×5): qty 2

## 2023-12-30 MED ORDER — LIDOCAINE 5 % EX PTCH
1.0000 | MEDICATED_PATCH | CUTANEOUS | Status: DC
  Administered 2023-12-30 – 2024-01-03 (×5): 1 via TRANSDERMAL
  Filled 2023-12-30 (×5): qty 1

## 2023-12-30 MED ORDER — HYDROMORPHONE HCL 1 MG/ML IJ SOLN
0.5000 mg | Freq: Once | INTRAMUSCULAR | Status: AC
  Administered 2023-12-30: 0.5 mg via INTRAVENOUS
  Filled 2023-12-30: qty 1

## 2023-12-30 MED ORDER — PANTOPRAZOLE SODIUM 40 MG PO TBEC
40.0000 mg | DELAYED_RELEASE_TABLET | Freq: Every day | ORAL | Status: DC
  Administered 2023-12-30 – 2024-01-03 (×5): 40 mg via ORAL
  Filled 2023-12-30 (×5): qty 1

## 2023-12-30 MED ORDER — METOPROLOL SUCCINATE ER 50 MG PO TB24
25.0000 mg | ORAL_TABLET | Freq: Every day | ORAL | Status: DC
  Administered 2023-12-30 – 2024-01-03 (×5): 25 mg via ORAL
  Filled 2023-12-30 (×5): qty 1

## 2023-12-30 MED ORDER — TORSEMIDE 20 MG PO TABS
40.0000 mg | ORAL_TABLET | Freq: Every day | ORAL | Status: DC
  Administered 2023-12-30 – 2024-01-03 (×5): 40 mg via ORAL
  Filled 2023-12-30 (×5): qty 2

## 2023-12-30 MED ORDER — OXYCODONE HCL 5 MG PO TABS
10.0000 mg | ORAL_TABLET | Freq: Two times a day (BID) | ORAL | Status: DC
  Administered 2023-12-30 – 2024-01-03 (×9): 10 mg via ORAL
  Filled 2023-12-30 (×9): qty 2

## 2023-12-30 NOTE — Progress Notes (Addendum)
 CSW was informed by rep at [facility] that pt has not had a 60 day wellness period since previous SNF stay; therefore, 9 days will be cobered at 100% and pt would have a responsibility of $209.50/day for any days afterward. Pt aware and CSW provided contact information for [facility] in the business office at [place].   Addend @ 3:11 pm Spoke with pt who reported she has not heard back from the business office. CSW called [facility] and left HIPAA Compliant voicemail. Pt continues to state she prefers [facility]. TOC following for whether [facility] can offer pt a bed pending conversation with Bari in the business office to discuss copay.

## 2023-12-30 NOTE — ED Provider Notes (Signed)
 [place] EMERGENCY DEPARTMENT AT [facility] Provider Note   CSN: [id number] Arrival date & time: 12/29/23  2110     History  Chief Complaint  Patient presents with   Maria [name] is a 68 y.o. female.  Patient with past medical history significant for chronic low back pain, GERD, obesity with a BMI of 59, lumbar disc disease, chronic pain on oxycodone , narcotic dependence, chronic diastolic CHF, physical deconditioning, weakness of both lower extremities, stage III CKD presents to the emergency department via EMS complaining of right-sided hip pain and low back pain secondary to a fall.  Patient is presenting for the second time in the past 3 days for bilateral knee pain, weakness, chronic pain flares.  She was evaluated Monday night due to swollen knees and was given pain medication and felt comfortable going home after workup was complete.  She states that since going home she has fallen multiple times and EMS has been to her house twice.  The first time they came she refused transport as she had just been evaluated.  Tonight she presents complaining of right sided hip pain, back pain, and is requesting assistance with placement in rehab or SNF.  She states she is unable to stand at home without falling due to weakness and deconditioning.  She denies abdominal pain, nausea, vomiting, chest pain, shortness of breath.  She denies fecal incontinence, urinary incontinence, urinary retention, saddle anesthesia.   Fall       Home Medications Prior to Admission medications   Medication Sig Start Date End Date Taking? Authorizing Provider  acetaminophen  (TYLENOL ) 650 MG CR tablet Take 1,300 mg by mouth every 8 (eight) hours as needed for pain.   Yes [provider]  albuterol  (VENTOLIN  HFA) 108 (90 Base) MCG/ACT inhaler Inhale 1 puff into the lungs every 6 (six) hours as needed for wheezing or shortness of breath.   Yes [provider]  cholecalciferol   (VITAMIN D3) 25 MCG (1000 UNIT) tablet Take 1,000 Units by mouth daily.   Yes [provider]  cyanocobalamin  1000 MCG tablet Take 1 tablet (1,000 mcg total) by mouth daily. 07/18/23  Yes [name], MD  cyclobenzaprine  (FEXMID ) 7.5 MG tablet Take 7.5 mg by mouth 3 (three) times daily as needed for muscle spasms. 11/23/23  Yes [provider]  famotidine  (PEPCID ) 40 MG tablet Take 40 mg by mouth daily. 10/04/23  Yes [provider]  fluticasone  (FLONASE ) 50 MCG/ACT nasal spray Place 1 spray into both nostrils daily. 12/07/22  Yes [provider]  Metamucil Fiber CHEW Chew 3 tablets by mouth 3 times/day as needed-between meals & bedtime (Constipation).   Yes [provider]  metoprolol  succinate (TOPROL -XL) 25 MG 24 hr tablet Take 1 tablet (25 mg total) by mouth daily. 07/18/23  Yes [name], MD  NARCAN  4 MG/0.1ML LIQD nasal spray kit Place 0.4 mg into the nose daily as needed (opioid overdose). 04/25/19  Yes [provider]  ondansetron  (ZOFRAN -ODT) 4 MG disintegrating tablet Take 4 mg by mouth as needed for nausea or vomiting. 09/14/23  Yes [provider]  Oxycodone  HCl 10 MG TABS Take 1 tablet (10 mg total) by mouth in the morning and at bedtime. 11/30/23  Yes [name], MD  pantoprazole  (PROTONIX ) 40 MG tablet TAKE ONE TABLET BY MOUTH DAILY 01/29/22  Yes [name], MD  Potassium Chloride  ER 20 MEQ TBCR Take 40 mEq by mouth in the morning and  at bedtime. 11/16/23  Yes [provider]  pramipexole  (MIRAPEX ) 0.5 MG tablet Take 0.5 mg by mouth at bedtime as needed. 12/16/23  Yes [provider]  tirzepatide CLOYDE) 15 MG/0.5ML Pen Inject 15 mg into the skin once a week.   Yes [provider]  torsemide  (DEMADEX ) 20 MG tablet Take 2 tablets (40 mg total) by mouth daily. 11/30/23  Yes [name], MD  clonazePAM  (KLONOPIN ) 1 MG tablet Take 1 tablet (1 mg total) by mouth 2 (two) times daily as  needed. for anxiety Patient taking differently: Take 1 mg by mouth 2 (two) times daily. 10/11/23   [name], MD  DULoxetine  (CYMBALTA ) 20 MG capsule Take 20 mg by mouth at bedtime. Patient not taking: Reported on 12/12/2023    [provider]  spironolactone  (ALDACTONE ) 25 MG tablet Take 0.5 tablets (12.5 mg total) by mouth daily. Patient taking differently: Take 25 mg by mouth daily. 08/14/22   [name], NP      Allergies    Amoxicillin -pot clavulanate, Adhesive [tape], Codeine, Crestor  [rosuvastatin  calcium ], Gabapentin , Lyrica [pregabalin], Morphine  and codeine, Vicodin [hydrocodone-acetaminophen ], and Latex    Review of Systems   Review of Systems  Physical Exam Updated Vital Signs BP (!) 109/44   Pulse 83   Temp 98.4 F (36.9 C) (Oral)   Resp 18   Ht 5' 3 (1.6 m)   Wt (!) 153.1 kg   SpO2 94%   BMI 59.80 kg/m  Physical Exam Vitals and nursing note reviewed.  Constitutional:      General: She is not in acute distress.    Appearance: She is well-developed. She is obese.  HENT:     Head: Normocephalic and atraumatic.     Right Ear: External ear normal.     Left Ear: External ear normal.     Mouth/Throat:     Mouth: Mucous membranes are moist.  Eyes:     Extraocular Movements: Extraocular movements intact.     Conjunctiva/sclera: Conjunctivae normal.  Cardiovascular:     Rate and Rhythm: Normal rate and regular rhythm.     Pulses: Normal pulses.     Heart sounds: Normal heart sounds. No murmur heard. Pulmonary:     Effort: Pulmonary effort is normal. No respiratory distress.     Breath sounds: Normal breath sounds.  Abdominal:     Palpations: Abdomen is soft.     Tenderness: There is no abdominal tenderness.  Musculoskeletal:        General: Swelling present.     Cervical back: Normal range of motion and neck supple.     Right lower leg: Edema present.     Left lower leg: Edema present.     Comments: Bilateral knee swelling, swollen  bilateral lower extremities.  Mild pain with passive range of motion of the right hip.  No deformity noted.  Skin:    General: Skin is warm and dry.     Capillary Refill: Capillary refill takes less than 2 seconds.  Neurological:     Mental Status: She is alert.  Psychiatric:        Mood and Affect: Mood normal.     ED Results / Procedures / Treatments   Labs (all labs ordered are listed, but only abnormal results are displayed) Labs Reviewed  BASIC METABOLIC PANEL - Abnormal; Notable for the following components:      Result Value   Sodium 134 (*)    Creatinine, Ser 1.19 (*)    Calcium   8.2 (*)    GFR, Estimated 50 (*)    All other components within normal limits  CBC WITH DIFFERENTIAL/PLATELET - Abnormal; Notable for the following components:   RBC 3.20 (*)    Hemoglobin 9.7 (*)    HCT 29.8 (*)    All other components within normal limits  URINALYSIS, ROUTINE W REFLEX MICROSCOPIC - Abnormal; Notable for the following components:   Leukocytes,Ua TRACE (*)    Bacteria, UA MANY (*)    All other components within normal limits    EKG None  Radiology CT PELVIS WO CONTRAST Result Date: 12/30/2023 CLINICAL DATA:  68 year old female with multiple recent falls. Possible right inferior pubic ramus fracture on radiographs. EXAM: CT PELVIS WITHOUT CONTRAST TECHNIQUE: Multidetector CT imaging of the pelvis was performed following the standard protocol without intravenous contrast. RADIATION DOSE REDUCTION: This exam was performed according to the departmental dose-optimization program which includes automated exposure control, adjustment of the mA and/or kV according to patient size and/or use of iterative reconstruction technique. COMPARISON:  CT lumbar spine and hip radiographs earlier today. CT Abdomen and Pelvis 10/09/2023. FINDINGS: Urinary Tract: Right renal lower pole partially visible and stable. Decompressed urinary bladder. Decompressed ureters. Bowel: Stable fat containing  umbilical hernia. Nondilated visible large and small bowel. Normal appendix on series 3, image 33. Vascular/Lymphatic: Aortoiliac calcified atherosclerosis. Vascular patency is not evaluated in the absence of IV contrast. Pelvic lymph nodes are stable and within normal limits. Reproductive: Surgically absent uterus and diminutive or absent ovaries as before. Other:  No free air or free fluid identified. Musculoskeletal: Ventral lower right abdominal wall probable subcutaneous injection site on series 3, image 50. Partially visible lumbar spine degeneration especially bulky and severe facet arthropathy with vacuum facet. Visible lumbar levels appear intact. Sacral detail limited by osteopenia. But no discrete sacral fracture is identified. The sacral ala and SI joints appear stable and intact. Elsewhere the bilateral pelvis and proximal femurs appear stable and intact. No pubic ramus fracture. Intact symphysis. No acute osseous abnormality identified. No superficial soft tissue injury identified. IMPRESSION: 1. No fracture or acute traumatic injury identified in the noncontrast Pelvis. 2. Osteopenia.  Advanced lower lumbar spine degeneration. 3.  Aortic Atherosclerosis (ICD10-I70.0). Electronically Signed   By: VEAR Hurst M.D.   On: 12/30/2023 06:05   CT Lumbar Spine Wo Contrast Result Date: 12/30/2023 CLINICAL DATA:  Multiple recent falls, Bactrim , EXAM: CT LUMBAR SPINE WITHOUT CONTRAST TECHNIQUE: Multidetector CT imaging of the lumbar spine was performed without intravenous contrast administration. Multiplanar CT image reconstructions were also generated. RADIATION DOSE REDUCTION: This exam was performed according to the departmental dose-optimization program which includes automated exposure control, adjustment of the mA and/or kV according to patient size and/or use of iterative reconstruction technique. COMPARISON:  MRI lumbar spine 07/30/2023 and radiographs 07/28/2023 FINDINGS: Segmentation: 5 lumbar type  vertebrae with partially sacralized L5. Alignment: Unchanged grade 1 anterolisthesis of L3. No evidence of traumatic listhesis. Vertebrae: No acute fracture. Paraspinal and other soft tissues: Aortic atherosclerotic calcification. No acute abnormality. Disc levels: Mild multilevel spondylosis. Intervertebral disc space height is maintained. Advanced facet arthropathy at L3-L4 and L4-L5. Redemonstrated severe spinal canal narrowing at L3-L4 and L4-L5. Neural foraminal narrowing is greatest on the left at L4-L5 where it is severe. IMPRESSION: No acute fracture or evidence of traumatic listhesis. Severe facet arthropathy at L3-L4 and L4-L5. Electronically Signed   By: Norman Gatlin M.D.   On: 12/30/2023 03:03   DG Knee 2 Views Right Result Date: 12/30/2023  CLINICAL DATA:  Fall, right knee pain EXAM: RIGHT KNEE - 1-2 VIEW COMPARISON:  None Available. FINDINGS: Tricompartment degenerative changes, most pronounced in the lateral and patellofemoral compartments. No joint effusion. No acute bony abnormality. Specifically, no fracture, subluxation, or dislocation. IMPRESSION: Tricompartment degenerative changes.  No acute bony abnormality. Electronically Signed   By: Franky Crease M.D.   On: 12/30/2023 00:54   DG Hip Unilat W or Wo Pelvis 2-3 Views Right Result Date: 12/30/2023 CLINICAL DATA:  Right hip and knee pain status post fall. Multiple falls last few days. EXAM: DG HIP (WITH OR WITHOUT PELVIS) 2-3V RIGHT COMPARISON:  12/11/2023 FINDINGS: Exam is compromised by body habitus. Question right inferior pubic ramus fracture. This is only seen on one view and may be projectional. Degenerative changes pubic symphysis, both hips, SI joints and lower lumbar spine. IMPRESSION: Question right inferior pubic ramus fracture. This is only seen on one view and may be projectional. Consider CT for further evaluation. Electronically Signed   By: Norman Gatlin M.D.   On: 12/30/2023 00:53    Procedures Procedures     Medications Ordered in ED Medications  cyclobenzaprine  (FLEXERIL ) tablet 7.5 mg (has no administration in time range)  famotidine  (PEPCID ) tablet 40 mg (has no administration in time range)  metoprolol  succinate (TOPROL -XL) 24 hr tablet 25 mg (has no administration in time range)  Oxycodone  HCl TABS 10 mg (has no administration in time range)  pantoprazole  (PROTONIX ) EC tablet 40 mg (has no administration in time range)  torsemide  (DEMADEX ) tablet 40 mg (has no administration in time range)  HYDROmorphone  (DILAUDID ) injection 0.5 mg (0.5 mg Intravenous Given 12/30/23 0101)  HYDROmorphone  (DILAUDID ) injection 0.5 mg (0.5 mg Intravenous Given 12/30/23 0440)    ED Course/ Medical Decision Making/ A&P                                 Medical Decision Making Amount and/or Complexity of Data Reviewed Labs: ordered. Radiology: ordered.  Risk Prescription drug management.   This patient presents to the ED for concern of multiple falls, right sided hip and thigh pain, physical deconditioning, this involves an extensive number of treatment options, and is a complaint that carries with it a high risk of complications and morbidity.  The differential diagnosis includes physical deconditioning, fracture, dislocation, others   Co morbidities that complicate the patient evaluation  Chronic pain, anxiety   Additional history obtained:  Additional history obtained from EMS External records from outside source obtained and reviewed including discharge summary from early December   Lab Tests:  I Ordered, and personally interpreted labs.  The pertinent results include: Hemoglobin 9.7   Imaging Studies ordered:  I ordered imaging studies including plain films of the right hip and knee, CT lumbar spine without contrast, CT pelvis without contrast I independently visualized and interpreted imaging which showed  Question right inferior pubic ramus fracture. This is only seen on  one view  and may be projectional. Consider CT for further  evaluation.  Tricompartment degenerative changes.  No acute bony abnormality.  No acute fracture or evidence of traumatic listhesis.    Severe facet arthropathy at L3-L4 and L4-L5.   1. No fracture or acute traumatic injury identified in the  noncontrast Pelvis.  2. Osteopenia.  Advanced lower lumbar spine degeneration.  3.  Aortic Atherosclerosis   I agree with the radiologist interpretation   Consultations Obtained:  I placed PT/OT evaluation  orders.   Problem List / ED Course / Critical interventions / Medication management   I ordered medication including Dilaudid  for pain Reevaluation of the patient after these medicines showed that the patient improved I have reviewed the patients home medicines and have made adjustments as needed   Test / Admission - Considered:  Patient stating she is unable to ambulate due to weakness in her bilateral lower extremities which has been worsening for a long period of time.  She feels she is deconditioned and may need to go to a rehab.  PT OT orders placed for evaluation of patient.  No acute findings on imaging today.  Patient placed in boarder status pending recommendations of PT/OT.  Diet and home meds ordered.         Final Clinical Impression(s) / ED Diagnoses Final diagnoses:  Weakness    Rx / DC Orders ED Discharge Orders     None         Logan Ubaldo KATHEE DEVONNA 12/30/23 [id number]    Bari Charmaine FALCON, MD 01/02/24 [phone]

## 2023-12-30 NOTE — Progress Notes (Signed)
Awaiting PT.  

## 2023-12-30 NOTE — ED Notes (Signed)
 Pt requesting pain meds, she is aware she has a dose at 10 am. She also inquired about food, updated pt tray has been ordered.

## 2023-12-30 NOTE — Evaluation (Signed)
 Occupational Therapy Evaluation Patient Details Name: Maria [name] MRN: [id number] DOB: [birth year] Today's Date: 12/30/2023   History of Present Illness Maria [name] is a 69 y.o. female returns to Ed  12/29/23 after multiple falls at home, recently in ED for same and DC from [facility] 12/11/24 . CT pelvis and lumbar spine  negative for fractures. EFY:rymnwpr low back pain, GERD, obesity with a BMI of 59, lumbar disc disease, chronic pain on oxycodone , narcotic dependence, chronic diastolic CHF, physical deconditioning, weakness of both lower extremities, stage III CKD   Clinical Impression   Pt is typically able to walk with a walker and care for herself. She does not drive, orders her groceries. She reports her insurance company helps with transportation to MD appointments. She reports her neighbor brings her meals daily. Pt tearful during session due to situation and pain. Pt currently requires +2 assist for bed level mobility. She was unable to stand from stretcher. Pt with redness, edema and pain in lower legs. Redness and discomfort under breasts, of which pt is aware. She needs set up to total assist for ADLs. Patient will benefit from continued inpatient follow up therapy, <3 hours/day       If plan is discharge home, recommend the following: Two people to help with walking and/or transfers;Two people to help with bathing/dressing/bathroom;Assistance with cooking/housework;Assist for transportation;Help with stairs or ramp for entrance    Functional Status Assessment  Patient has had a recent decline in their functional status and/or demonstrates limited ability to make significant improvements in function in a reasonable and predictable amount of time  Equipment Recommendations  Hospital bed;Wheelchair (measurements OT);Wheelchair cushion (measurements OT)    Recommendations for Other Services       Precautions / Restrictions Precautions Precautions: Fall Precaution Comments: leg  edema, pain L knee Restrictions Weight Bearing Restrictions Per Provider Order: No      Mobility Bed Mobility Overal bed mobility: Needs Assistance Bed Mobility: Rolling, Sidelying to Sit, Sit to Supine Rolling: Mod assist, +2 for physical assistance, +2 for safety/equipment Sidelying to sit: Max assist, +2 for physical assistance, +2 for safety/equipment, HOB elevated   Sit to supine: Total assist, +2 for physical assistance, +2 for safety/equipment   General bed mobility comments: cues for log roll technique to minimize back pain, assist for all aspects    Transfers                   General transfer comment: unable  , on high stretcher and  reports pain in legs      Balance Overall balance assessment: Needs assistance, History of Falls Sitting-balance support: Bilateral upper extremity supported, Feet unsupported Sitting balance-Leahy Scale: Poor Sitting balance - Comments: props on UEs, CGA                                   ADL either performed or assessed with clinical judgement   ADL Overall ADL's : Needs assistance/impaired Eating/Feeding: Independent;Bed level   Grooming: Contact guard assist;Sitting   Upper Body Bathing: Minimal assistance;Sitting   Lower Body Bathing: +2 for physical assistance;Total assistance;Bed level   Upper Body Dressing : Minimal assistance;Sitting   Lower Body Dressing: Total assistance;+2 for physical assistance;Sit to/from stand       Toileting- Architect and Hygiene: Total assistance;+2 for physical assistance;Sit to/from stand               Vision  Baseline Vision/History: 1 Wears glasses Ability to See in Adequate Light: 0 Adequate Patient Visual Report: No change from baseline       Perception         Praxis         Pertinent Vitals/Pain Pain Assessment Pain Assessment: Faces Faces Pain Scale: Hurts whole lot Pain Location: L knee, back Pain Descriptors / Indicators:  Throbbing, Crying, Discomfort, Crushing Pain Intervention(s): Monitored during session, Repositioned     Extremity/Trunk Assessment Upper Extremity Assessment Upper Extremity Assessment: Overall WFL for tasks assessed   Lower Extremity Assessment Lower Extremity Assessment: Defer to PT evaluation RLE Deficits / Details: distal LE edematous, erythematous; limited ROM d/t body habitus; strength grossly 2+/5 LLE Deficits / Details: distal LE edematous, erythematous; limited ROM d/t body habitus; strength grossly 2+/5   Cervical / Trunk Assessment Cervical / Trunk Assessment: Other exceptions (weakness) Cervical / Trunk Exceptions: some limitations due to body habitus   Communication Communication Communication: No apparent difficulties   Cognition Arousal: Alert Behavior During Therapy: WFL for tasks assessed/performed, Lability Overall Cognitive Status: Within Functional Limits for tasks assessed                                 General Comments: very emotional about current situation and pain     General Comments       Exercises     Shoulder Instructions      Home Living Family/patient expects to be discharged to:: Private residence Living Arrangements: Alone Available Help at Discharge: Neighbor;Available PRN/intermittently (neighbor brings meals) Type of Home: Apartment Home Access: Stairs to enter Entrance Stairs-Number of Steps: stairs spaced out from parking lot to door- ~ 6 Entrance Stairs-Rails: Right Home Layout: One level     Bathroom Shower/Tub: Chief Strategy Officer: Standard     Home Equipment: Hospital bed;Tub bench;Rolling Walker (2 wheels);Rollator (4 wheels);BSC/3in1   Additional Comments: bedside commode frame placed over toilet      Prior Functioning/Environment Prior Level of Function : History of Falls (last six months);Independent/Modified Independent             Mobility Comments: Uses a RW in home;Rollator  when she goes out; cannot fit RW through bathroom door. holds onto wall, sink ADLs Comments: BSC over toilet. Cooks microwave dinners mostly and sometimes orders out. Been independent with ADLs just takes longer; reports cleaning hard        OT Problem List: Decreased activity tolerance;Decreased strength;Impaired balance (sitting and/or standing);Decreased coordination;Pain;Obesity      OT Treatment/Interventions: Self-care/ADL training;DME and/or AE instruction;Therapeutic activities;Patient/family education;Balance training    OT Goals(Current goals can be found in the care plan section) Acute Rehab OT Goals OT Goal Formulation: With patient Time For Goal Achievement: 01/13/24 Potential to Achieve Goals: Good ADL Goals Pt Will Perform Grooming: with set-up;sitting Pt Will Perform Upper Body Bathing: with min assist;sitting Pt Will Perform Upper Body Dressing: with set-up;sitting Pt Will Transfer to Toilet: with +2 assist;with min assist;stand pivot transfer;bedside commode Additional ADL Goal #1: Pt will complete bed mobility with moderate assistance using log roll technique in preparation for ADLs. Additional ADL Goal #2: Pt will demonstrate fair sitting balance in preparation for ADLs.  OT Frequency: Min 1X/week    Co-evaluation   Reason for Co-Treatment: Complexity of the patient's impairments (multi-system involvement);To address functional/ADL transfers;For patient/therapist safety PT goals addressed during session: Mobility/safety with mobility OT goals addressed during session: ADL's  and self-care      AM-PAC OT 6 Clicks Daily Activity     Outcome Measure Help from another person eating meals?: None Help from another person taking care of personal grooming?: A Little Help from another person toileting, which includes using toliet, bedpan, or urinal?: Total Help from another person bathing (including washing, rinsing, drying)?: A Lot Help from another person to put  on and taking off regular upper body clothing?: A Little Help from another person to put on and taking off regular lower body clothing?: Total 6 Click Score: 14   End of Session Nurse Communication: Other (comment);Patient requests pain meds (tight arm band)  Activity Tolerance: Patient limited by pain Patient left: in bed;with call bell/phone within reach  OT Visit Diagnosis: Unsteadiness on feet (R26.81);Other abnormalities of gait and mobility (R26.89);Muscle weakness (generalized) (M62.81);Pain;History of falling (Z91.81)                Time: 9180-9154 OT Time Calculation (min): 26 min Charges:  OT General Charges $OT Visit: 1 Visit OT Evaluation $OT Eval Moderate Complexity: 1 Mod  Mliss HERO, OTR/L Acute Rehabilitation Services Office: [phone]   Kennth Mliss Helling 12/30/2023, 10:08 AM

## 2023-12-30 NOTE — ED Notes (Signed)
 EDP notified of pt pain level and request for fentanyl.

## 2023-12-30 NOTE — TOC Progression Note (Signed)
 Transition of Care (TOC) - Progression Note    Patient Details  Name: Maria [name] MRN: [id number] Date of Birth: 1956/07/12  Transition of Care [facility]) CM/SW Contact  Hartley KATHEE Treasure ISRAEL Phone Number: 12/30/2023, 6:53 PM  Clinical Narrative:     CSW spoke with pt regarding bed choice, provided current offers, pt insistent on waiting to hear back from [place] before making a decision. CSW explained a choice needed to be made, pt requested this CSW to reach out to [place] [facility] one final time, CSW explained that choice would need to be made in the morning if no word back from [place], pt verbalized understanding. CSW went over bed offers and star ratings with pt.      Barriers to Discharge: No Barriers Identified  Expected Discharge Plan and Services                                               Social Determinants of Health (SDOH) Interventions SDOH Screenings   Food Insecurity: No Food Insecurity (11/25/2023)  Recent Concern: Food Insecurity - Food Insecurity Present (09/06/2023)  Housing: Low Risk  (11/25/2023)  Transportation Needs: Unmet Transportation Needs (11/25/2023)  Utilities: Not At Risk (11/25/2023)  Alcohol  Screen: Low Risk  (12/30/2021)  Depression (PHQ2-9): Medium Risk (01/30/2022)  Financial Resource Strain: Medium Risk (12/30/2021)  Physical Activity: Inactive (12/30/2021)  Social Connections: Unknown (09/17/2022)   Received [facility]  Stress: No Stress Concern Present (12/30/2021)  Tobacco Use: Medium Risk (12/29/2023)    Readmission Risk Interventions    11/29/2023    1:16 PM 09/06/2023    1:10 PM 07/22/2022    2:59 PM  Readmission Risk Prevention Plan  Transportation Screening Complete Complete Complete  PCP or Specialist Appt within 3-5 Days  Complete Complete  HRI or Home Care Consult  Complete Complete  Social Work Consult for Recovery Care Planning/Counseling  Complete Complete  Palliative Care Screening  Not Applicable  Complete  Medication Review Oceanographer) Complete Complete Complete  PCP or Specialist appointment within 3-5 days of discharge Complete    HRI or Home Care Consult Complete    SW Recovery Care/Counseling Consult Complete    Palliative Care Screening Not Applicable    Skilled Nursing Facility Patient Refused

## 2023-12-30 NOTE — Progress Notes (Signed)
 Transition of Care [facility]) - Emergency Department Mini Assessment   Patient Details  Name: Maria [name] MRN: [id number] Date of Birth: 1956/06/22  Transition of Care [facility]) CM/SW Contact:    Maria JONETTA Daisy, LCSW Phone Number: 12/30/2023, 9:43 AM   Clinical Narrative: CSW spoke with pt at bedside who states PT has evaluated her and she is interested in going to rehab. CSW inquired about preferred facility via Medicare.gov and pt stated [facility]. CSW will await PT eval and fax pt out for bed offers.    ED Mini Assessment: What brought you to the Emergency Department? : multiple falls  Barriers to Discharge: No Barriers Identified     Means of departure: Ambulance       Patient Contact and Communications Key Contact 1: Patient   Spoke with: Patient Contact Date: 12/30/23,   Contact time: 0925   Call outcome: Patient is interested in SNF  Patient states their goals for this hospitalization and ongoing recovery are:: Patient is interested in SNF CMS Medicare.gov Compare Post Acute Care list provided to:: Patient Choice offered to / list presented to : Patient  Admission diagnosis:  Fall, Knee, hip, leg pain Patient Active Problem List   Diagnosis Date Noted   Pressure injury of skin 11/26/2023   Hallucinations 09/02/2023   Stasis dermatitis of both legs 08/30/2023   Hypomagnesemia 08/02/2023   Ankle pain, right 08/01/2023   Weakness of both lower extremities 07/30/2023   Morbid obesity (HCC) 07/30/2023   (HFpEF) heart failure with preserved ejection fraction (HCC) 07/15/2023   Abdominal pain 07/15/2023   Acute renal failure superimposed on stage 3a chronic kidney disease (HCC) 07/15/2023   Acute on chronic respiratory failure with hypoxia and hypercapnia (HCC) 02/08/2023   Pneumonia due to infectious organism 02/05/2023   Chronic right-sided congestive heart failure (HCC) 02/04/2023   Fall 02/02/2023   Physical deconditioning 07/22/2022   Fluid overload 07/21/2022    Acute cystitis with hematuria    Dyspnea 12/19/2021   Leg swelling 11/20/2021   Bilateral knee pain 10/29/2021   Hypernatremia 09/24/2021   Cellulitis of lower extremity 09/24/2021   CKD (chronic kidney disease), stage IIIa (HCC) 09/24/2021   Sacral decubitus ulcer, stage II (HCC) 09/20/2021   Acute on chronic heart failure with preserved ejection fraction (HFpEF) (HCC) 09/19/2021   Nail disorder 07/07/2021   Pre-ulcerative corn or callous 07/07/2021   Rash 07/07/2021   Chronic diastolic CHF (congestive heart failure) (HCC) 03/17/2021   Type 2 diabetes mellitus with stage 3a chronic kidney disease, without long-term current use of insulin  (HCC) 03/11/2021   Vitamin D  deficiency 01/01/2021   Medial epicondylitis 12/31/2020   Aortic atherosclerosis (HCC) 09/26/2020   Umbilical hernia 09/26/2020   Constipation 08/05/2020   Arthritis 06/12/2020   Hoarseness 05/01/2020   B12 deficiency 01/31/2020   Rosacea 01/31/2020   Neck swelling 01/31/2020   History of colonic polyps    Benign neoplasm of [place]    Nausea 08/31/2019   Throat pain 07/26/2019   HLD (hyperlipidemia) 05/24/2019   Leg cramps 05/24/2019   Lump in neck 05/24/2019   Left thyroid  nodule 01/25/2019   Jerking movements of extremities 07/20/2018   Balance disorder 07/20/2018   Right knee pain 03/21/2018   OSA (obstructive sleep apnea) 03/21/2018   Oxygen desaturation 03/21/2018   Urinary symptom or sign 03/21/2018   Lymphedema 01/25/2018   Gait disorder 01/25/2018   Dysphonia 12/07/2017   Encounter for well adult exam with abnormal findings 11/08/2017   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   [place] polyp 08/11/2016   Abdominal pain, epigastric    Dysphagia    [place] cancer screening    ACE-inhibitor cough 12/05/2015   Restless legs syndrome 10/08/2015   Venous stasis dermatitis of both lower extremities 04/26/2015   GERD without esophagitis 10/17/2014   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/17/2014   AR  (allergic rhinitis) 10/17/2014   Fatty liver 10/17/2014   Chronic pain syndrome 09/19/2014   Post-traumatic osteoarthritis of both knees 09/19/2014   Spinal stenosis 09/19/2014   Depression, major, single episode, complete remission (HCC) 09/19/2014   Narcotic dependence (HCC) 09/19/2014   Acute hypoxic respiratory failure (HCC) 08/20/2014   False positive HIV serology 06/02/2013   Hypokalemia 06/29/2012   Spondylosis of 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 03/09/2012   Lumbar disc disease 03/09/2012   Morbid obesity with body mass index (BMI) of 50.0 to 59.9 in adult (HCC) 06/17/2011   Chronic low back pain 12/28/2008   Anxiety state 12/06/2007   Depression 12/06/2007   HTN (hypertension) 12/06/2007   Gastroesophageal reflux disease without esophagitis 12/06/2007   LOW BACK PAIN 12/06/2007   PCP:  Ileen [name], NP Pharmacy:   [facility] - [place], [place] - [address] [place] [address] Phone: [phone] Fax: [phone]

## 2023-12-30 NOTE — ED Notes (Signed)
 [place] PIV placed.

## 2023-12-30 NOTE — NC FL2 (Signed)
 [place]  MEDICAID FL2 LEVEL OF CARE FORM     IDENTIFICATION  Patient Name: Maria [name] Birthdate: 09-Nov-1956 Sex: female Admission Date (Current Location): 12/29/2023  [facility] and [place] Number:  Producer, Television/film/video and Address:  [facility],  [address], [place] [address]      Provider Number: [id number]  Attending Physician Name and Address:  Bari Charmaine FALCON, MD  Relative Name and Phone Number:  Rodriguez,[name] (Daughter)  [phone] (Mobile)    Current Level of Care: Hospital Recommended Level of Care: Skilled Nursing Facility Prior Approval Number:    Date Approved/Denied:   PASRR Number: [id number]  Discharge Plan: SNF    Current Diagnoses: Patient Active Problem List   Diagnosis Date Noted   Pressure injury of skin 11/26/2023   Hallucinations 09/02/2023   Stasis dermatitis of both legs 08/30/2023   Hypomagnesemia 08/02/2023   Ankle pain, right 08/01/2023   Weakness of both lower extremities 07/30/2023   Morbid obesity (HCC) 07/30/2023   (HFpEF) heart failure with preserved ejection fraction (HCC) 07/15/2023   Abdominal pain 07/15/2023   Acute renal failure superimposed on stage 3a chronic kidney disease (HCC) 07/15/2023   Acute on chronic respiratory failure with hypoxia and hypercapnia (HCC) 02/08/2023   Pneumonia due to infectious organism 02/05/2023   Chronic right-sided congestive heart failure (HCC) 02/04/2023   Fall 02/02/2023   Physical deconditioning 07/22/2022   Fluid overload 07/21/2022   Acute cystitis with hematuria    Dyspnea 12/19/2021   Leg swelling 11/20/2021   Bilateral knee pain 10/29/2021   Hypernatremia 09/24/2021   Cellulitis of lower extremity 09/24/2021   CKD (chronic kidney disease), stage IIIa (HCC) 09/24/2021   Sacral decubitus ulcer, stage II (HCC) 09/20/2021   Acute on chronic heart failure with preserved ejection fraction (HFpEF) (HCC) 09/19/2021   Nail disorder 07/07/2021   Pre-ulcerative  corn or callous 07/07/2021   Rash 07/07/2021   Chronic diastolic CHF (congestive heart failure) (HCC) 03/17/2021   Type 2 diabetes mellitus with stage 3a chronic kidney disease, without long-term current use of insulin  (HCC) 03/11/2021   Vitamin D  deficiency 01/01/2021   Medial epicondylitis 12/31/2020   Aortic atherosclerosis (HCC) 09/26/2020   Umbilical hernia 09/26/2020   Constipation 08/05/2020   Arthritis 06/12/2020   Hoarseness 05/01/2020   B12 deficiency 01/31/2020   Rosacea 01/31/2020   Neck swelling 01/31/2020   History of colonic polyps    Benign neoplasm of [place]    Nausea 08/31/2019   Throat pain 07/26/2019   HLD (hyperlipidemia) 05/24/2019   Leg cramps 05/24/2019   Lump in neck 05/24/2019   Left thyroid  nodule 01/25/2019   Jerking movements of extremities 07/20/2018   Balance disorder 07/20/2018   Right knee pain 03/21/2018   OSA (obstructive sleep apnea) 03/21/2018   Oxygen desaturation 03/21/2018   Urinary symptom or sign 03/21/2018   Lymphedema 01/25/2018   Gait disorder 01/25/2018   Dysphonia 12/07/2017   Encounter for well adult exam with abnormal findings 11/08/2017   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   [place] polyp 08/11/2016   Abdominal pain, epigastric    Dysphagia    [place] cancer screening    ACE-inhibitor cough 12/05/2015   Restless legs syndrome 10/08/2015   Venous stasis dermatitis of both lower extremities 04/26/2015   GERD without esophagitis 10/17/2014   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/17/2014   AR (allergic rhinitis) 10/17/2014   Fatty liver 10/17/2014   Chronic pain syndrome 09/19/2014   Post-traumatic osteoarthritis of both knees 09/19/2014  Spinal stenosis 09/19/2014   Depression, major, single episode, complete remission (HCC) 09/19/2014   Narcotic dependence (HCC) 09/19/2014   Acute hypoxic respiratory failure (HCC) 08/20/2014   False positive HIV serology 06/02/2013   Hypokalemia 06/29/2012   Spondylosis of lumbar  region without myelopathy or radiculopathy 03/09/2012   Lumbar disc disease 03/09/2012   Morbid obesity with body mass index (BMI) of 50.0 to 59.9 in adult (HCC) 06/17/2011   Chronic low back pain 12/28/2008   Anxiety state 12/06/2007   Depression 12/06/2007   HTN (hypertension) 12/06/2007   Gastroesophageal reflux disease without esophagitis 12/06/2007   LOW BACK PAIN 12/06/2007    Orientation RESPIRATION BLADDER Height & Weight     Self, Time, Situation, Place  Normal Continent Weight: (!) 337 lb 9.6 oz (153.1 kg) Height:  5' 3 (160 cm)  BEHAVIORAL SYMPTOMS/MOOD NEUROLOGICAL BOWEL NUTRITION STATUS      Continent Diet (Regular)  AMBULATORY STATUS COMMUNICATION OF NEEDS Skin   Extensive Assist Verbally Normal                       Personal Care Assistance Level of Assistance  Bathing, Dressing, Feeding Bathing Assistance: Maximum assistance Feeding assistance: Independent Dressing Assistance: Maximum assistance     Functional Limitations Info  Sight, Hearing, Speech Sight Info: Adequate Hearing Info: Adequate Speech Info: Adequate    SPECIAL CARE FACTORS FREQUENCY  PT (By licensed PT), OT (By licensed OT)     PT Frequency: x5/week OT Frequency: x5/week            Contractures Contractures Info: Not present    Additional Factors Info  Code Status, Allergies Code Status Info: Full Allergies Info: Amoxicillin -pot Clavulanate  Adhesive (Tape)  Codeine  Crestor  (Rosuvastatin  Calcium )  Gabapentin   Lyrica (Pregabalin)  Morphine  And Codeine  Vicodin (Hydrocodone-acetaminophen )  Latex           Current Medications (12/30/2023):  This is the current hospital active medication list Current Facility-Administered Medications  Medication Dose Route Frequency Provider Last Rate Last Admin   cyclobenzaprine  (FLEXERIL ) tablet 7.5 mg  7.5 mg Oral TID PRN Logan Martinis B, PA-C       famotidine  (PEPCID ) tablet 40 mg  40 mg Oral Daily Logan Martinis B, PA-C   40 mg at  12/30/23 9072   lidocaine  (LIDODERM ) 5 % 1 patch  1 patch Transdermal Q24H Neysa Clap J, DO       metoprolol  succinate (TOPROL -XL) 24 hr tablet 25 mg  25 mg Oral Daily Logan Martinis B, PA-C   25 mg at 12/30/23 9073   oxyCODONE  (Oxy IR/ROXICODONE ) immediate release tablet 10 mg  10 mg Oral BID Logan Martinis B, PA-C   10 mg at 12/30/23 9073   pantoprazole  (PROTONIX ) EC tablet 40 mg  40 mg Oral Daily Logan Martinis NOVAK, PA-C   40 mg at 12/30/23 9072   torsemide  (DEMADEX ) tablet 40 mg  40 mg Oral Daily Logan Martinis NOVAK, PA-C   40 mg at 12/30/23 9073   Current Outpatient Medications  Medication Sig Dispense Refill   acetaminophen  (TYLENOL ) 650 MG CR tablet Take 1,300 mg by mouth every 8 (eight) hours as needed for pain.     albuterol  (VENTOLIN  HFA) 108 (90 Base) MCG/ACT inhaler Inhale 1 puff into the lungs every 6 (six) hours as needed for wheezing or shortness of breath.     cholecalciferol  (VITAMIN D3) 25 MCG (1000 UNIT) tablet Take 1,000 Units by mouth daily.     cyanocobalamin   1000 MCG tablet Take 1 tablet (1,000 mcg total) by mouth daily. 30 tablet 0   cyclobenzaprine  (FEXMID ) 7.5 MG tablet Take 7.5 mg by mouth 3 (three) times daily as needed for muscle spasms.     famotidine  (PEPCID ) 40 MG tablet Take 40 mg by mouth daily.     fluticasone  (FLONASE ) 50 MCG/ACT nasal spray Place 1 spray into both nostrils daily.     Metamucil Fiber CHEW Chew 3 tablets by mouth 3 times/day as needed-between meals & bedtime (Constipation).     metoprolol  succinate (TOPROL -XL) 25 MG 24 hr tablet Take 1 tablet (25 mg total) by mouth daily. 30 tablet 0   NARCAN  4 MG/0.1ML LIQD nasal spray kit Place 0.4 mg into the nose daily as needed (opioid overdose).     ondansetron  (ZOFRAN -ODT) 4 MG disintegrating tablet Take 4 mg by mouth as needed for nausea or vomiting.     Oxycodone  HCl 10 MG TABS Take 1 tablet (10 mg total) by mouth in the morning and at bedtime.     pantoprazole  (PROTONIX ) 40 MG tablet TAKE ONE TABLET  BY MOUTH DAILY 90 tablet 2   Potassium Chloride  ER 20 MEQ TBCR Take 40 mEq by mouth in the morning and at bedtime.     pramipexole  (MIRAPEX ) 0.5 MG tablet Take 0.5 mg by mouth at bedtime as needed.     tirzepatide (MOUNJARO) 15 MG/0.5ML Pen Inject 15 mg into the skin once a week.     torsemide  (DEMADEX ) 20 MG tablet Take 2 tablets (40 mg total) by mouth daily.     clonazePAM  (KLONOPIN ) 1 MG tablet Take 1 tablet (1 mg total) by mouth 2 (two) times daily as needed. for anxiety (Patient taking differently: Take 1 mg by mouth 2 (two) times daily.) 60 tablet 0   DULoxetine  (CYMBALTA ) 20 MG capsule Take 20 mg by mouth at bedtime. (Patient not taking: Reported on 12/12/2023)     spironolactone  (ALDACTONE ) 25 MG tablet Take 0.5 tablets (12.5 mg total) by mouth daily. (Patient taking differently: Take 25 mg by mouth daily.) 90 tablet 1     Discharge Medications: Please see discharge summary for a list of discharge medications.  Relevant Imaging Results:  Relevant Lab Results:   Additional Information [id number]  Kari JONETTA Daisy, LCSW

## 2023-12-30 NOTE — Evaluation (Signed)
 Physical Therapy Evaluation Patient Details Name: Maria [name] MRN: [id number] DOB: Nov 10, 1956 Today's Date: 12/30/2023  History of Present Illness  Maria [name] is a 68 y.o. female returns to Ed  12/29/23 after multiple falls at home, recently in ED for same and DC from [facility] 12/11/24 . CT pelvis and lumbar spine  negative for fractures. EFY:rymnwpr low back pain, GERD, obesity with a BMI of 59, lumbar disc disease, chronic pain on oxycodone , narcotic dependence, chronic diastolic CHF, physical deconditioning, weakness of both lower extremities, stage III CKD  Clinical Impression  Pt admitted with above diagnosis.  Pt currently with functional limitations due to the deficits listed below (see PT Problem List). Pt will benefit from acute skilled PT to increase their independence and safety with mobility to allow discharge.       The patient  resting in bead, very tearful in regards to back to Ed, pain in legs and back and right hip. Patient asking about results of  pelvis(no  acute fractures  per CT.) The patient reports multiple  falls recently. Patient has no support.  Patient clearly  risk for fall. Noted quite severe redness under pannus and left breat. Patient could benefit from [facility] consult and Interdry sheets. Patient  states that she cannot take care of herself. Patient will benefit from continued inpatient follow up therapy, <3 hours/day     If plan is discharge home, recommend the following: Two people to help with walking and/or transfers;Two people to help with bathing/dressing/bathroom;Assist for transportation;Assistance with cooking/housework;Help with stairs or ramp for entrance   Can travel by private vehicle   No    Equipment Recommendations None recommended by PT  Recommendations for Other Services       Functional Status Assessment Patient has had a recent decline in their functional status and demonstrates the ability to make significant improvements in function in  a reasonable and predictable amount of time.     Precautions / Restrictions Precautions Precautions: Fall Precaution Comments: leg edema, pain L knee Restrictions Weight Bearing Restrictions Per Provider Order: No      Mobility  Bed Mobility Overal bed mobility: Needs Assistance Bed Mobility: Rolling, Sidelying to Sit, Sit to Supine Rolling: Mod assist, +2 for physical assistance, +2 for safety/equipment Sidelying to sit: Max assist, +2 for physical assistance, +2 for safety/equipment, HOB elevated   Sit to supine: Total assist, +2 for physical assistance, +2 for safety/equipment        Transfers                   General transfer comment: unable  , on high stretcher and  reports pain in legs, at risk to  buckle.    Ambulation/Gait                  Stairs            Wheelchair Mobility     Tilt Bed    Modified Rankin (Stroke Patients Only)       Balance Overall balance assessment: Needs assistance, History of Falls Sitting-balance support: Bilateral upper extremity supported, Feet unsupported Sitting balance-Leahy Scale: Poor                                       Pertinent Vitals/Pain Pain Assessment Pain Assessment: 0-10 Pain Score: 10-Worst pain ever Pain Location: L kne worst Pain Descriptors / Indicators: Throbbing, Crying,  Discomfort, Crushing Pain Intervention(s): Limited activity within patient's tolerance, Repositioned, Monitored during session, Patient requesting pain meds-RN notified    Home Living Family/patient expects to be discharged to:: Private residence Living Arrangements: Alone Available Help at Discharge:  (limited  assist from family) Type of Home: Apartment Home Access: Stairs to enter Entrance Stairs-Rails: Right Entrance Stairs-Number of Steps: stairs spaced out from parking lot to door- ~ 6     Home Equipment: Hospital bed;Tub bench;Rolling Walker (2 wheels);Rollator (4  wheels);BSC/3in1 Additional Comments: bedside commode frame placed over toilet    Prior Function Prior Level of Function : History of Falls (last six months);Independent/Modified Independent             Mobility Comments: Uses a RW in home;Rollator when she goes out; cannot fit RW through bathroom door. holds onto wall, sink ADLs Comments: BSC over toilet. Cooks microwave dinners mostly and sometimes orders out. Been independent with ADLs just takes longer; reports cleaning hard     Extremity/Trunk Assessment   Upper Extremity Assessment Upper Extremity Assessment: Overall WFL for tasks assessed    Lower Extremity Assessment Lower Extremity Assessment: LLE deficits/detail;RLE deficits/detail RLE Deficits / Details: distal LE edematous, erythematous; limited ROM d/t body habitus; strength grossly 2+/5 LLE Deficits / Details: distal LE edematous, erythematous; limited ROM d/t body habitus; strength grossly 2+/5    Cervical / Trunk Assessment Cervical / Trunk Assessment: Neck Surgery Cervical / Trunk Exceptions: some limitations due to body habitus  Communication   Communication Communication: No apparent difficulties  Cognition Arousal: Alert Behavior During Therapy: WFL for tasks assessed/performed, Lability Overall Cognitive Status: Within Functional Limits for tasks assessed                                 General Comments: very emotional about current situation and pain        General Comments      Exercises     Assessment/Plan    PT Assessment Patient needs continued PT services  PT Problem List Decreased strength;Decreased range of motion;Decreased activity tolerance;Decreased balance;Decreased mobility;Decreased knowledge of use of DME;Cardiopulmonary status limiting activity;Decreased cognition;Decreased safety awareness;Obesity       PT Treatment Interventions DME instruction;Therapeutic exercise;Gait training;Balance training;Functional  mobility training;Therapeutic activities;Patient/family education    PT Goals (Current goals can be found in the Care Plan section)  Acute Rehab PT Goals Patient Stated Goal: to be able to take care of herself PT Goal Formulation: With patient Time For Goal Achievement: 01/13/24 Potential to Achieve Goals: Fair    Frequency Min 1X/week     Co-evaluation PT/OT/SLP Co-Evaluation/Treatment: Yes Reason for Co-Treatment: Complexity of the patient's impairments (multi-system involvement);To address functional/ADL transfers;For patient/therapist safety PT goals addressed during session: Mobility/safety with mobility OT goals addressed during session: ADL's and self-care       AM-PAC PT 6 Clicks Mobility  Outcome Measure Help needed turning from your back to your side while in a flat bed without using bedrails?: A Lot Help needed moving from lying on your back to sitting on the side of a flat bed without using bedrails?: Total Help needed moving to and from a bed to a chair (including a wheelchair)?: Total Help needed standing up from a chair using your arms (e.g., wheelchair or bedside chair)?: Total Help needed to walk in hospital room?: Total Help needed climbing 3-5 steps with a railing? : Total 6 Click Score: 7    End of Session  Activity Tolerance: Patient limited by pain Patient left: in bed;with call bell/phone within reach Nurse Communication: Mobility status;Need for lift equipment PT Visit Diagnosis: Muscle weakness (generalized) (M62.81);Difficulty in walking, not elsewhere classified (R26.2);History of falling (Z91.81)    Time: 9180-9154 PT Time Calculation (min) (ACUTE ONLY): 26 min   Charges:   PT Evaluation $PT Eval Low Complexity: 1 Low   PT General Charges $$ ACUTE PT VISIT: 1 Visit         Darice Potters PT Acute Rehabilitation Services Office [phone] Weekend pager-[phone]   Potters Darice Norris 12/30/2023, 9:39 AM

## 2023-12-30 NOTE — ED Notes (Signed)
 Patient listening to music in bed, relaxing calm and cooperative.

## 2023-12-31 ENCOUNTER — Emergency Department (HOSPITAL_COMMUNITY): Payer: 59

## 2023-12-31 LAB — RESP PANEL BY RT-PCR (RSV, FLU A&B, COVID)  RVPGX2
Influenza A by PCR: NEGATIVE
Influenza B by PCR: NEGATIVE
Resp Syncytial Virus by PCR: NEGATIVE
SARS Coronavirus 2 by RT PCR: NEGATIVE

## 2023-12-31 MED ORDER — PRAMIPEXOLE DIHYDROCHLORIDE 0.25 MG PO TABS
0.5000 mg | ORAL_TABLET | Freq: Every day | ORAL | Status: DC
  Administered 2023-12-31 – 2024-01-02 (×4): 0.5 mg via ORAL
  Filled 2023-12-31 (×4): qty 2

## 2023-12-31 MED ORDER — HYDROMORPHONE HCL 1 MG/ML IJ SOLN
1.0000 mg | Freq: Once | INTRAMUSCULAR | Status: DC
  Filled 2023-12-31: qty 1

## 2023-12-31 MED ORDER — HYDROMORPHONE HCL 1 MG/ML IJ SOLN
0.5000 mg | Freq: Once | INTRAMUSCULAR | Status: DC
  Administered 2023-12-31: 0.5 mg via INTRAVENOUS

## 2023-12-31 MED ORDER — ALBUTEROL SULFATE HFA 108 (90 BASE) MCG/ACT IN AERS
2.0000 | INHALATION_SPRAY | Freq: Once | RESPIRATORY_TRACT | Status: AC
  Administered 2023-12-31: 2 via RESPIRATORY_TRACT
  Filled 2023-12-31: qty 6.7

## 2023-12-31 MED ORDER — PRAMIPEXOLE DIHYDROCHLORIDE 0.25 MG PO TABS
0.2500 mg | ORAL_TABLET | Freq: Once | ORAL | Status: AC
Start: 2023-12-31 — End: 2023-12-31
  Administered 2023-12-31: 0.25 mg via ORAL
  Filled 2023-12-31: qty 1

## 2023-12-31 NOTE — Progress Notes (Signed)
 Physical Therapy Treatment Patient Details Name: Maria [name] MRN: [id number] DOB: [birth year] Today's Date: 12/31/2023   History of Present Illness Maria [name] is a 68 y.o. female returns to Ed  12/29/23 after multiple falls at home, recently in ED for same and DC from [facility] 12/11/24 . CT pelvis and lumbar spine  negative for fractures. EFY:rymnwpr low back pain, GERD, obesity with a BMI of 59, lumbar disc disease, chronic pain on oxycodone , narcotic dependence, chronic diastolic CHF, physical deconditioning, weakness of both lower extremities, stage III CKD    PT Comments  Pt making good progress today.  She required min-mod A transfers.  She ambulated 29' and 8' but fatigued easily.  R thigh/hip remain painful - altered and performed exercises as able.  Continue to recommend Patient will benefit from continued inpatient follow up therapy, <3 hours/day at d/c as pt fall risk and below baseline.      If plan is discharge home, recommend the following: Assist for transportation;Assistance with cooking/housework;Help with stairs or ramp for entrance;A little help with walking and/or transfers;A little help with bathing/dressing/bathroom   Can travel by private vehicle     No  Equipment Recommendations  None recommended by PT    Recommendations for Other Services       Precautions / Restrictions Precautions Precautions: Fall     Mobility  Bed Mobility Overal bed mobility: Needs Assistance Bed Mobility: Sit to Supine       Sit to supine: Mod assist   General bed mobility comments: Assist for legs back to bed    Transfers Overall transfer level: Needs assistance Equipment used: Rolling walker (2 wheels) Transfers: Sit to/from Stand Sit to Stand: Min assist           General transfer comment: STS from recliner and toilet with min A and icnreased time; cues for hand placement    Ambulation/Gait Ambulation/Gait assistance: Min assist Gait Distance (Feet): 12 Feet (12'  then 8') Assistive device: Rolling walker (2 wheels) Gait Pattern/deviations: Step-to pattern, Decreased stride length, Wide base of support Gait velocity: decreased     General Gait Details: Ambulated 12' then 8'; fatigues easily; min A for balance and RW;  Chair follow would be beneficial for longer distance   Stairs             Wheelchair Mobility     Tilt Bed    Modified Rankin (Stroke Patients Only)       Balance Overall balance assessment: Needs assistance, History of Falls Sitting-balance support: No upper extremity supported, Feet supported Sitting balance-Leahy Scale: Good     Standing balance support: Bilateral upper extremity supported, Reliant on assistive device for balance Standing balance-Leahy Scale: Poor Standing balance comment: RW to ambulate, did wash hands at sink but leaning on elbows                            Cognition Arousal: Alert Behavior During Therapy: WFL for tasks assessed/performed Overall Cognitive Status: Within Functional Limits for tasks assessed                                 General Comments: Pt pleased with her progress today        Exercises General Exercises - Lower Extremity Ankle Circles/Pumps: AROM, Both, 10 reps, Supine Quad Sets: AROM, Both, 10 reps, Supine Gluteal Sets: AROM, Both, 10 reps, Supine  Long Arc Quad: AAROM, Both, 10 reps, Supine Other Exercises Other Exercises: pelvic tilts x 10    General Comments General comments (skin integrity, edema, etc.): Bil LE with edema/erythema - noting purple tint when in dependent position - improved in supine when elevated. Wound in umbilicus - nursing aware      Pertinent Vitals/Pain Pain Assessment Pain Assessment: Faces Faces Pain Scale: Hurts even more Pain Location: R hip/thigh with certain movements Pain Descriptors / Indicators: Discomfort, Aching Pain Intervention(s): Limited activity within patient's tolerance, Monitored  during session, Repositioned    Home Living                          Prior Function            PT Goals (current goals can now be found in the care plan section) Progress towards PT goals: Progressing toward goals    Frequency           PT Plan      Co-evaluation              AM-PAC PT 6 Clicks Mobility   Outcome Measure  Help needed turning from your back to your side while in a flat bed without using bedrails?: A Lot Help needed moving from lying on your back to sitting on the side of a flat bed without using bedrails?: A Lot Help needed moving to and from a bed to a chair (including a wheelchair)?: A Lot Help needed standing up from a chair using your arms (e.g., wheelchair or bedside chair)?: A Lot Help needed to walk in hospital room?: Total Help needed climbing 3-5 steps with a railing? : Total 6 Click Score: 10    End of Session Equipment Utilized During Treatment: Gait belt Activity Tolerance: Patient tolerated treatment well Patient left: in bed;with call bell/phone within reach Nurse Communication: Mobility status PT Visit Diagnosis: Muscle weakness (generalized) (M62.81);Difficulty in walking, not elsewhere classified (R26.2);History of falling (Z91.81)     Time: 8491-8468 PT Time Calculation (min) (ACUTE ONLY): 23 min  Charges:    $Gait Training: 8-22 mins $Therapeutic Exercise: 8-22 mins PT General Charges $$ ACUTE PT VISIT: 1 Visit                     Benjiman, PT Acute Rehab Services [facility] Rehab [phone]    Benjiman VEAR Mulberry 12/31/2023, 4:50 PM

## 2023-12-31 NOTE — ED Provider Notes (Signed)
 I was informed by nursing that patient had increasing cough. Patient was given albuterol  MDI.  Patient had x-ray around January 6 that was negative.  Repeat x-ray tonight shows mild edema the patient has underlying history of HFpEF and is on torsemide  She is in no distress and not requiring oxygen. Advised to continue torsemide  BP (!) 139/54 (BP Location: Right Arm)   Pulse 87   Temp 98.2 F (36.8 C) (Oral)   Resp 18   Ht 1.6 m (5' 3)   Wt (!) 153.1 kg   SpO2 97%   BMI 59.80 kg/m     Midge Golas, MD 12/31/23 [phone]

## 2023-12-31 NOTE — ED Notes (Signed)
Patient ambulated to bathroom via walker.

## 2023-12-31 NOTE — ED Notes (Signed)
 Patient cleaned up after Purewick was malfunctioning. Constantly complaining of pain, MD notified multiple times. Pain medication scheduled with PRN flexeril . States that she needs something stronger. Reports ongoing back pain and right hip pain in which lidocaine  patch was applied.

## 2023-12-31 NOTE — Progress Notes (Signed)
 CSW spoke with pt at bedside to inform that [facility] is unable to extend bed offer. Pt has selected [facility] at this time. [facility] notified. TOC to initiate Auth.

## 2023-12-31 NOTE — ED Notes (Signed)
 Advised MD on belly button

## 2023-12-31 NOTE — Care Management (Signed)
 Received voicemail message from [name]. Patient has been approved for SNF placement to [facility] effective 12/31/23, next review date 01/04/24  Ref# [id number]. Message received after business hours.    TOC will continue to follow.

## 2023-12-31 NOTE — ED Provider Notes (Signed)
 Emergency Medicine Observation Re-evaluation Note  Maria [name] is a 68 y.o. female, seen on rounds today.  Pt initially presented to the ED for complaints of Fall Currently, the patient is not feeling well all.  Patient had a slight cough yesterday more of a cough this morning feeling a bit achy.  Patient's not toxic.  But will check for COVID flu RSV.  Patient is awaiting nursing home placement.  Physical Exam  BP (!) 139/54 (BP Location: Right Arm)   Pulse 87   Temp 98.2 F (36.8 C) (Oral)   Resp 18   Ht 1.6 m (5' 3)   Wt (!) 153.1 kg   SpO2 97%   BMI 59.80 kg/m  Physical Exam General: Alert and cooperative with cough Cardiac:  Lungs: Clear to auscultation no respiratory distress Psych: Baseline  ED Course / MDM  EKG:   I have reviewed the labs performed to date as well as medications administered while in observation.  Recent changes in the last 24 hours include development of cough and this morning feels achy not feeling well.  Based on this we will test for COVID flu and RSV.  Will continue to monitor.  Plan  Current plan is for nursing home placement.    Maria Decuir, MD 12/31/23 [phone]

## 2024-01-01 MED ORDER — HYDROMORPHONE HCL 1 MG/ML IJ SOLN
1.0000 mg | Freq: Once | INTRAMUSCULAR | Status: AC
  Administered 2024-01-01: 1 mg via SUBCUTANEOUS
  Filled 2024-01-01: qty 1

## 2024-01-01 MED ORDER — MUPIROCIN 2 % EX OINT
TOPICAL_OINTMENT | Freq: Two times a day (BID) | CUTANEOUS | Status: DC
  Filled 2024-01-01: qty 22

## 2024-01-01 MED ORDER — CEPHALEXIN 500 MG PO CAPS
500.0000 mg | ORAL_CAPSULE | Freq: Four times a day (QID) | ORAL | Status: DC
  Administered 2024-01-01 – 2024-01-03 (×9): 500 mg via ORAL
  Filled 2024-01-01 (×9): qty 1

## 2024-01-01 NOTE — Progress Notes (Signed)
 TOC will confirm admission on Monday morning with admin.

## 2024-01-01 NOTE — ED Notes (Signed)
 [facility] called to give report for admission on Monday. They said to call back Monday morning for give report. [facility]: [phone]

## 2024-01-01 NOTE — ED Provider Notes (Signed)
 Emergency Medicine Observation Re-evaluation Note  Maria [name] is a 68 y.o. female, seen on rounds today.  Pt initially presented to the ED for complaints of Fall Currently, the patient is awake and alert.  She states that she is having some irritation in her umbilicus and also is complaining of redness in her legs that she states worsened in the last day.  Denies any fever.  Physical Exam  BP (!) 124/52 (BP Location: Right Arm)   Pulse 70   Temp 97.7 F (36.5 C) (Oral)   Resp 18   Ht 5' 3 (1.6 m)   Wt (!) 153.1 kg   SpO2 96%   BMI 59.80 kg/m  Physical Exam General: Awake and alert, no acute distress Cardiac: Regular rate Lungs: No increased work of breathing Psych: Calm, cooperative Skin: Mild erythema to bilateral shins with superficial scabbed wound on right anterior shin, mild warmth to touch; crusting with small amount of serous drainage in umbilicus, no significant erythema or warmth  ED Course / MDM  EKG:   I have reviewed the labs performed to date as well as medications administered while in observation.  Recent changes in the last 24 hours include Patient remains medically cleared.  Recommended for SNF.  Will start on Keflex  for presumed cellulitis of the legs and mupirocin  for her wound in her umbilicus.  Plan  Current plan is for SNF.    Kingsley, [place] Freshour K, DO 01/01/24 [phone]

## 2024-01-01 NOTE — Progress Notes (Signed)
 TOC anticipates admission to [facility] on Monday due to weather.

## 2024-01-02 NOTE — ED Provider Notes (Signed)
 Emergency Medicine Observation Re-evaluation Note  Rosaland Shiffman Jaquay is a 68 y.o. female, seen on rounds today.  Pt initially presented to the ED for complaints of Fall Currently, the patient is resting comfortably.  Physical Exam  BP (!) 113/53 (BP Location: Left Wrist)   Pulse 79   Temp 98.4 F (36.9 C) (Oral)   Resp 18   Ht 5' 3 (1.6 m)   Wt (!) 153.1 kg   SpO2 97%   BMI 59.80 kg/m  Physical Exam General: No acute distress Cardiac: Regular rate Lungs: No respiratory distress Psych: Currently calm  ED Course / MDM  EKG:   I have reviewed the labs performed to date as well as medications administered while in observation.  Recent changes in the last 24 hours include -patient has been seen by [facility] team.  They anticipate admission on Monday to his skilled facility.  Plan  Current plan is for holding patient for nursing home placement.    Charlyn Sora, MD 01/02/24 [phone]

## 2024-01-02 NOTE — ED Notes (Signed)
 Pt c/o leg pain 10/10, pt will be medicated per [facility]

## 2024-01-02 NOTE — ED Notes (Signed)
Pt resting with no new complaints.

## 2024-01-03 MED ORDER — CLONAZEPAM 1 MG PO TABS
1.0000 mg | ORAL_TABLET | Freq: Two times a day (BID) | ORAL | Status: DC | PRN
  Administered 2024-01-03: 1 mg via ORAL
  Filled 2024-01-03: qty 1

## 2024-01-03 MED ORDER — PRAMIPEXOLE DIHYDROCHLORIDE 0.25 MG PO TABS
0.5000 mg | ORAL_TABLET | Freq: Once | ORAL | Status: AC
  Administered 2024-01-03: 0.5 mg via ORAL
  Filled 2024-01-03: qty 2

## 2024-01-03 NOTE — ED Provider Notes (Signed)
 Emergency Medicine Observation Re-evaluation Note  Maria [name] is a 68 y.o. female, seen on rounds today.  Pt initially presented to the ED for complaints of Fall Currently, the patient is awake in bed.  She states that she still has pain due to the bruising of her right knee and had restless leg in her right leg last night otherwise no new complaints today.  Physical Exam  BP (!) 129/49 (BP Location: Left Arm)   Pulse 72   Temp 98.3 F (36.8 C) (Oral)   Resp 18   Ht 5' 3 (1.6 m)   Wt (!) 153.1 kg   SpO2 98%   BMI 59.80 kg/m  Physical Exam General: Awake and alert, no acute distress Cardiac: Regular rate Lungs: No increased work of breathing Psych: Calm, cooperative  ED Course / MDM  EKG:   I have reviewed the labs performed to date as well as medications administered while in observation.  Recent changes in the last 24 hours include patient remains medically cleared.  She was recommended for SNF, possible admission to [facility] today.  Plan  Current plan is for SNF, possible to [facility] today.    Kingsley, Jonathan Kirkendoll K, DO 01/03/24 [phone]

## 2024-01-03 NOTE — Progress Notes (Signed)
 CSW outreached to [place] in Admissions at [facility] to inquire about admit time.

## 2024-01-03 NOTE — ED Notes (Signed)
 Patient complaining of restless leg pain. She is requesting a dose of her medication. MD notified.

## 2024-01-03 NOTE — Discharge Instructions (Signed)
 You were seen in the emergency department for your weakness and your fall.  You had no new injuries today but were recommended to go to short-term rehab to help with your pain control and to increase your strength.  You should return to the emergency department for any new or concerning symptoms.

## 2024-01-03 NOTE — Progress Notes (Signed)
 Pt can admit to [facility] after 11. PTAR scheduled for pick up at 11. Room and report info provided to RN. EDP and RN made aware of discharge plan via secure chat.

## 2024-01-03 NOTE — ED Notes (Signed)
 Pt will be d/c today to [facility]. Can go after 11am

## 2024-01-12 ENCOUNTER — Other Ambulatory Visit: Payer: Self-pay | Admitting: Student in an Organized Health Care Education/Training Program

## 2024-01-12 DIAGNOSIS — M4322 Fusion of spine, cervical region: Secondary | ICD-10-CM

## 2024-01-12 DIAGNOSIS — M5412 Radiculopathy, cervical region: Secondary | ICD-10-CM

## 2024-01-26 ENCOUNTER — Inpatient Hospital Stay: Admission: RE | Admit: 2024-01-26 | Payer: 59 | Source: Ambulatory Visit

## 2024-01-30 ENCOUNTER — Inpatient Hospital Stay (HOSPITAL_COMMUNITY)
Admission: EM | Admit: 2024-01-30 | Discharge: 2024-02-02 | DRG: 689 | Disposition: A | Discharge status: Skilled Nursing Facility | Payer: 59 | Attending: Internal Medicine | Admitting: Internal Medicine

## 2024-01-30 ENCOUNTER — Emergency Department (HOSPITAL_COMMUNITY): Payer: 59

## 2024-01-30 ENCOUNTER — Encounter (HOSPITAL_COMMUNITY): Payer: Self-pay | Admitting: *Deleted

## 2024-01-30 ENCOUNTER — Other Ambulatory Visit: Payer: Self-pay

## 2024-01-30 DIAGNOSIS — W19XXXA Unspecified fall, initial encounter: Secondary | ICD-10-CM

## 2024-01-30 DIAGNOSIS — Z880 Allergy status to penicillin: Secondary | ICD-10-CM

## 2024-01-30 DIAGNOSIS — E65 Localized adiposity: Secondary | ICD-10-CM | POA: Diagnosis present

## 2024-01-30 DIAGNOSIS — E11649 Type 2 diabetes mellitus with hypoglycemia without coma: Secondary | ICD-10-CM | POA: Diagnosis present

## 2024-01-30 DIAGNOSIS — E119 Type 2 diabetes mellitus without complications: Secondary | ICD-10-CM

## 2024-01-30 DIAGNOSIS — D638 Anemia in other chronic diseases classified elsewhere: Secondary | ICD-10-CM | POA: Diagnosis present

## 2024-01-30 DIAGNOSIS — R531 Weakness: Principal | ICD-10-CM

## 2024-01-30 DIAGNOSIS — Z833 Family history of diabetes mellitus: Secondary | ICD-10-CM

## 2024-01-30 DIAGNOSIS — Z818 Family history of other mental and behavioral disorders: Secondary | ICD-10-CM

## 2024-01-30 DIAGNOSIS — Z79891 Long term (current) use of opiate analgesic: Secondary | ICD-10-CM

## 2024-01-30 DIAGNOSIS — L89153 Pressure ulcer of sacral region, stage 3: Secondary | ICD-10-CM | POA: Diagnosis present

## 2024-01-30 DIAGNOSIS — E860 Dehydration: Secondary | ICD-10-CM | POA: Diagnosis present

## 2024-01-30 DIAGNOSIS — Z8614 Personal history of Methicillin resistant Staphylococcus aureus infection: Secondary | ICD-10-CM

## 2024-01-30 DIAGNOSIS — I5032 Chronic diastolic (congestive) heart failure: Secondary | ICD-10-CM | POA: Diagnosis present

## 2024-01-30 DIAGNOSIS — Z800 Family history of malignant neoplasm of digestive organs: Secondary | ICD-10-CM

## 2024-01-30 DIAGNOSIS — I70209 Unspecified atherosclerosis of native arteries of extremities, unspecified extremity: Secondary | ICD-10-CM | POA: Diagnosis present

## 2024-01-30 DIAGNOSIS — N3090 Cystitis, unspecified without hematuria: Secondary | ICD-10-CM

## 2024-01-30 DIAGNOSIS — Z8719 Personal history of other diseases of the digestive system: Secondary | ICD-10-CM

## 2024-01-30 DIAGNOSIS — R5381 Other malaise: Secondary | ICD-10-CM | POA: Diagnosis present

## 2024-01-30 DIAGNOSIS — Z8249 Family history of ischemic heart disease and other diseases of the circulatory system: Secondary | ICD-10-CM

## 2024-01-30 DIAGNOSIS — E1151 Type 2 diabetes mellitus with diabetic peripheral angiopathy without gangrene: Secondary | ICD-10-CM | POA: Diagnosis present

## 2024-01-30 DIAGNOSIS — L304 Erythema intertrigo: Secondary | ICD-10-CM | POA: Diagnosis present

## 2024-01-30 DIAGNOSIS — Z8619 Personal history of other infectious and parasitic diseases: Secondary | ICD-10-CM

## 2024-01-30 DIAGNOSIS — Z825 Family history of asthma and other chronic lower respiratory diseases: Secondary | ICD-10-CM

## 2024-01-30 DIAGNOSIS — Z888 Allergy status to other drugs, medicaments and biological substances status: Secondary | ICD-10-CM

## 2024-01-30 DIAGNOSIS — Z7409 Other reduced mobility: Secondary | ICD-10-CM | POA: Diagnosis present

## 2024-01-30 DIAGNOSIS — Z90710 Acquired absence of both cervix and uterus: Secondary | ICD-10-CM

## 2024-01-30 DIAGNOSIS — Y92098 Other place in other non-institutional residence as the place of occurrence of the external cause: Secondary | ICD-10-CM

## 2024-01-30 DIAGNOSIS — Z8041 Family history of malignant neoplasm of ovary: Secondary | ICD-10-CM

## 2024-01-30 DIAGNOSIS — M545 Low back pain, unspecified: Secondary | ICD-10-CM | POA: Diagnosis present

## 2024-01-30 DIAGNOSIS — B9689 Other specified bacterial agents as the cause of diseases classified elsewhere: Secondary | ICD-10-CM | POA: Diagnosis present

## 2024-01-30 DIAGNOSIS — Z91048 Other nonmedicinal substance allergy status: Secondary | ICD-10-CM

## 2024-01-30 DIAGNOSIS — I110 Hypertensive heart disease with heart failure: Secondary | ICD-10-CM | POA: Diagnosis present

## 2024-01-30 DIAGNOSIS — G8929 Other chronic pain: Secondary | ICD-10-CM | POA: Diagnosis present

## 2024-01-30 DIAGNOSIS — Z9889 Other specified postprocedural states: Secondary | ICD-10-CM

## 2024-01-30 DIAGNOSIS — Z1152 Encounter for screening for COVID-19: Secondary | ICD-10-CM

## 2024-01-30 DIAGNOSIS — E162 Hypoglycemia, unspecified: Secondary | ICD-10-CM | POA: Diagnosis present

## 2024-01-30 DIAGNOSIS — N3000 Acute cystitis without hematuria: Principal | ICD-10-CM | POA: Diagnosis present

## 2024-01-30 DIAGNOSIS — E785 Hyperlipidemia, unspecified: Secondary | ICD-10-CM | POA: Diagnosis present

## 2024-01-30 DIAGNOSIS — Z90721 Acquired absence of ovaries, unilateral: Secondary | ICD-10-CM

## 2024-01-30 DIAGNOSIS — Z91040 Latex allergy status: Secondary | ICD-10-CM

## 2024-01-30 DIAGNOSIS — J309 Allergic rhinitis, unspecified: Secondary | ICD-10-CM | POA: Diagnosis present

## 2024-01-30 DIAGNOSIS — Z7985 Long-term (current) use of injectable non-insulin antidiabetic drugs: Secondary | ICD-10-CM

## 2024-01-30 DIAGNOSIS — Z87891 Personal history of nicotine dependence: Secondary | ICD-10-CM

## 2024-01-30 DIAGNOSIS — Z885 Allergy status to narcotic agent status: Secondary | ICD-10-CM

## 2024-01-30 DIAGNOSIS — F411 Generalized anxiety disorder: Secondary | ICD-10-CM | POA: Diagnosis present

## 2024-01-30 DIAGNOSIS — Z79899 Other long term (current) drug therapy: Secondary | ICD-10-CM

## 2024-01-30 DIAGNOSIS — W08XXXA Fall from other furniture, initial encounter: Secondary | ICD-10-CM | POA: Diagnosis present

## 2024-01-30 DIAGNOSIS — I10 Essential (primary) hypertension: Secondary | ICD-10-CM | POA: Diagnosis present

## 2024-01-30 LAB — URINALYSIS, ROUTINE W REFLEX MICROSCOPIC
Bilirubin Urine: NEGATIVE
Glucose, UA: NEGATIVE mg/dL
Ketones, ur: NEGATIVE mg/dL
Nitrite: NEGATIVE
Protein, ur: NEGATIVE mg/dL
Specific Gravity, Urine: 1.016 (ref 1.005–1.030)
pH: 5 (ref 5.0–8.0)

## 2024-01-30 LAB — CBC
HCT: 33.8 % — ABNORMAL LOW (ref 36.0–46.0)
Hemoglobin: 10.8 g/dL — ABNORMAL LOW (ref 12.0–15.0)
MCH: 29.7 pg (ref 26.0–34.0)
MCHC: 32 g/dL (ref 30.0–36.0)
MCV: 92.9 fL (ref 80.0–100.0)
Platelets: 244 10*3/uL (ref 150–400)
RBC: 3.64 MIL/uL — ABNORMAL LOW (ref 3.87–5.11)
RDW: 13.8 % (ref 11.5–15.5)
WBC: 9.8 10*3/uL (ref 4.0–10.5)
nRBC: 0 % (ref 0.0–0.2)

## 2024-01-30 LAB — CBG MONITORING, ED
Glucose-Capillary: 55 mg/dL — ABNORMAL LOW (ref 70–99)
Glucose-Capillary: 69 mg/dL — ABNORMAL LOW (ref 70–99)
Glucose-Capillary: 67 mg/dL — ABNORMAL LOW (ref 70–99)
Glucose-Capillary: 135 mg/dL — ABNORMAL HIGH (ref 70–99)
Glucose-Capillary: 65 mg/dL — ABNORMAL LOW (ref 70–99)

## 2024-01-30 LAB — RESP PANEL BY RT-PCR (RSV, FLU A&B, COVID)  RVPGX2
Influenza A by PCR: NEGATIVE
Influenza B by PCR: NEGATIVE
Resp Syncytial Virus by PCR: NEGATIVE
SARS Coronavirus 2 by RT PCR: NEGATIVE

## 2024-01-30 LAB — BASIC METABOLIC PANEL
Anion gap: 12 (ref 5–15)
BUN: 17 mg/dL (ref 8–23)
CO2: 25 mmol/L (ref 22–32)
Calcium: 9.1 mg/dL (ref 8.9–10.3)
Chloride: 99 mmol/L (ref 98–111)
Creatinine, Ser: 1.24 mg/dL — ABNORMAL HIGH (ref 0.44–1.00)
GFR, Estimated: 48 mL/min — ABNORMAL LOW (ref >60)
Glucose, Bld: 80 mg/dL (ref 70–99)
Potassium: 4.8 mmol/L (ref 3.5–5.1)
Sodium: 136 mmol/L (ref 135–145)

## 2024-01-30 MED ORDER — LACTATED RINGERS IV BOLUS
1000.0000 mL | Freq: Once | INTRAVENOUS | Status: AC
  Administered 2024-01-30: 1000 mL via INTRAVENOUS

## 2024-01-30 MED ORDER — DEXTROSE 50 % IV SOLN
25.0000 g | Freq: Once | INTRAVENOUS | Status: AC
  Administered 2024-01-30: 25 g via INTRAVENOUS
  Filled 2024-01-30: qty 50

## 2024-01-30 MED ORDER — FENTANYL CITRATE PF 50 MCG/ML IJ SOSY
25.0000 ug | PREFILLED_SYRINGE | Freq: Once | INTRAMUSCULAR | Status: AC
  Administered 2024-01-30: 25 ug via INTRAVENOUS
  Filled 2024-01-30: qty 1

## 2024-01-30 MED ORDER — CEPHALEXIN 250 MG PO CAPS
500.0000 mg | ORAL_CAPSULE | Freq: Once | ORAL | Status: AC
  Administered 2024-01-30: 500 mg via ORAL
  Filled 2024-01-30: qty 2

## 2024-01-30 NOTE — ED Notes (Signed)
 Patient transported to X-ray

## 2024-01-30 NOTE — ED Notes (Signed)
 Patient unable to ambulate using a walker , PA notified .

## 2024-01-30 NOTE — ED Triage Notes (Signed)
 Pt just returned home (where she lives on her own) on 1/31 from a rehab facility where she was being tx for weakness.  Pt has felt progressively weaker since she has returned home.  Last night she said her sister came to the couch where she was sleeping and told her to get up and go to her bed (her sister recently passed away).  She attempted to get up and slid to the floor.  Unable to get up, she called 911 with her cell phone.  Pt ao x 4, though quite drowsy.

## 2024-01-30 NOTE — ED Notes (Signed)
 Patient given juice and graham crackers/peanut butter.

## 2024-01-30 NOTE — ED Provider Notes (Signed)
 Maria [name] Provider Note   CSN: [id number] Arrival date & time: 01/30/24  1516     History  Chief Complaint  Patient presents with   Weakness    Maria [name] is a 68 y.o. female with history of CHF, diabetes, low back pain, presents with concern for sliding off of her couch earlier today.  States she slid and landed on her bottom.  She did not hit her head.  Denies losing consciousness.  Patient is not on any blood thinners.  She reports she had difficulty getting up off the ground and so she called 911.  Reports some pain in her buttocks where she landed, but no pain anywhere else.   Weakness      Home Medications Prior to Admission medications   Medication Sig Start Date End Date Taking? Authorizing Provider  acetaminophen  (TYLENOL ) 650 MG CR tablet Take 1,300 mg by mouth every 8 (eight) hours as needed for pain.    Provider, Historical, [name]  albuterol  (VENTOLIN  HFA) 108 (90 Base) MCG/ACT inhaler Inhale 1 puff into the lungs every 6 (six) hours as needed for wheezing or shortness of breath.    Provider, Historical, [name]  cholecalciferol  (VITAMIN D3) 25 MCG (1000 UNIT) tablet Take 1,000 Units by mouth daily.    Provider, Historical, [name]  clonazePAM  (KLONOPIN ) 1 MG tablet Take 1 tablet (1 mg total) by mouth 2 (two) times daily as needed. for anxiety Patient taking differently: Take 1 mg by mouth 2 (two) times daily. 10/11/23   Maria [name]  cyanocobalamin  1000 MCG tablet Take 1 tablet (1,000 mcg total) by mouth daily. 07/18/23   Maria [name]  cyclobenzaprine  (FEXMID ) 7.5 MG tablet Take 7.5 mg by mouth 3 (three) times daily as needed for muscle spasms. 11/23/23   Provider, Historical, [name]  DULoxetine  (CYMBALTA ) 20 MG capsule Take 20 mg by mouth at bedtime. Patient not taking: Reported on 12/12/2023    Provider, Historical, [name]  famotidine  (PEPCID ) 40 MG tablet Take 40 mg by mouth daily. 10/04/23   Provider, Historical, [name]   fluticasone  (FLONASE ) 50 MCG/ACT nasal spray Place 1 spray into both nostrils daily. 12/07/22   Provider, Historical, [name]  Metamucil Fiber CHEW Chew 3 tablets by mouth 3 times/day as needed-between meals & bedtime (Constipation).    Provider, Historical, [name]  metoprolol  succinate (TOPROL -XL) 25 MG 24 hr tablet Take 1 tablet (25 mg total) by mouth daily. 07/18/23   Maria [name]  NARCAN  4 MG/0.1ML LIQD nasal spray kit Place 0.4 mg into the nose daily as needed (opioid overdose). 04/25/19   Provider, Historical, [name]  ondansetron  (ZOFRAN -ODT) 4 MG disintegrating tablet Take 4 mg by mouth as needed for nausea or vomiting. 09/14/23   Provider, Historical, [name]  Oxycodone  HCl 10 MG TABS Take 1 tablet (10 mg total) by mouth in the morning and at bedtime. 11/30/23   Maria [name]  pantoprazole  (PROTONIX ) 40 MG tablet TAKE ONE TABLET BY MOUTH DAILY 01/29/22   Maria [name]  Potassium Chloride  ER 20 MEQ TBCR Take 40 mEq by mouth in the morning and at bedtime. 11/16/23   Provider, Historical, [name]  pramipexole  (MIRAPEX ) 0.5 MG tablet Take 0.5 mg by mouth at bedtime as needed. 12/16/23   Provider, Historical, [name]  spironolactone  (ALDACTONE ) 25 MG tablet Take 0.5 tablets (12.5 mg total) by mouth daily. Patient taking differently: Take 25 mg by mouth daily. 08/14/22   Maria [name]  tirzepatide (  MOUNJARO) 15 MG/0.5ML Pen Inject 15 mg into the skin once a week.    Provider, Historical, [name]  torsemide  (DEMADEX ) 20 MG tablet Take 2 tablets (40 mg total) by mouth daily. 11/30/23   Maria [name]      Allergies    Amoxicillin -pot clavulanate, Adhesive [tape], Codeine, Crestor  [rosuvastatin  calcium ], Gabapentin , Lyrica [pregabalin], Morphine  and codeine, Vicodin [hydrocodone-acetaminophen ], and Latex    Review of Systems   Review of Systems  Neurological:  Positive for weakness.    Physical Exam Updated Vital Signs BP (!) 117/51 (BP Location: Left Arm)   Pulse 85   Temp 97.6 F (36.4 C) (Oral)    Resp 20   Ht 5' 3 (1.6 m)   Wt (!) 153.1 kg   SpO2 100%   BMI 59.80 kg/m  Physical Exam Vitals and nursing note reviewed.  Constitutional:      General: She is not in acute distress.    Appearance: She is well-developed.  HENT:     Head: Normocephalic and atraumatic.     Comments: No abrasions, lacerations, hematomas No tenderness palpation of the skull diffusely Eyes:     Conjunctiva/sclera: Conjunctivae normal.  Cardiovascular:     Rate and Rhythm: Normal rate and regular rhythm.     Heart sounds: No murmur heard.    Comments: Cap refill under 2 seconds 2+ radial pulse bilaterally 2+ dorsalis pedis pulse bilaterally Pulmonary:     Effort: Pulmonary effort is normal. No respiratory distress.     Breath sounds: Normal breath sounds.  Abdominal:     Palpations: Abdomen is soft.     Tenderness: There is no abdominal tenderness.  Musculoskeletal:        General: No swelling.     Cervical back: Neck supple.     Comments: Tender to palpation of the lumbar spine.  Tender over the proximal femurs bilaterally.  Patient reports chronic pain in the low back and her hips.    No tenderness palpation of the cervical or thoracic spine.  Patient sits up without difficulty in the stretcher.  Unable to ambulate due to pain even with RN assistance  Skin:    General: Skin is warm and dry.     Capillary Refill: Capillary refill takes less than 2 seconds.  Neurological:     General: No focal deficit present.     Mental Status: She is alert.     Comments: Patient with 5/5 strength in the bilateral upper and lower extremities.  Intact sensation of the upper and lower extremities bilaterally  Alert and oriented x 4.  Psychiatric:        Mood and Affect: Mood normal.     ED Results / Procedures / Treatments   Labs (all labs ordered are listed, but only abnormal results are displayed) Labs Reviewed  BASIC METABOLIC PANEL - Abnormal; Notable for the following components:      Result  Value   Creatinine, Ser 1.24 (*)    GFR, Estimated 48 (*)    All other components within normal limits  CBC - Abnormal; Notable for the following components:   RBC 3.64 (*)    Hemoglobin 10.8 (*)    HCT 33.8 (*)    All other components within normal limits  URINALYSIS, ROUTINE W REFLEX MICROSCOPIC - Abnormal; Notable for the following components:   APPearance HAZY (*)    Hgb urine dipstick SMALL (*)    Leukocytes,Ua LARGE (*)    Bacteria, UA RARE (*)  All other components within normal limits  CBG MONITORING, ED - Abnormal; Notable for the following components:   Glucose-Capillary 55 (*)    All other components within normal limits  CBG MONITORING, ED - Abnormal; Notable for the following components:   Glucose-Capillary 69 (*)    All other components within normal limits  CBG MONITORING, ED - Abnormal; Notable for the following components:   Glucose-Capillary 67 (*)    All other components within normal limits  CBG MONITORING, ED - Abnormal; Notable for the following components:   Glucose-Capillary 65 (*)    All other components within normal limits  CBG MONITORING, ED - Abnormal; Notable for the following components:   Glucose-Capillary 135 (*)    All other components within normal limits  RESP PANEL BY RT-PCR (RSV, FLU A&B, COVID)  [id number]  URINE CULTURE  CBC WITH DIFFERENTIAL/PLATELET  COMPREHENSIVE METABOLIC PANEL  MAGNESIUM   MAGNESIUM   HEMOGLOBIN A1C    EKG None  Radiology DG Hip Unilat W or Wo Pelvis 2-3 Views Left Result Date: 01/30/2024 CLINICAL DATA:  Clemens, lower back pain EXAM: DG HIP (WITH OR WITHOUT PELVIS) 2-3V LEFT; DG HIP (WITH OR WITHOUT PELVIS) 2-3V RIGHT COMPARISON:  12/30/2023 x-ray and CT low FINDINGS: Frontal view of the pelvis as well as frontal and frogleg lateral views of the bilateral hips are obtained. No fracture, subluxation, or dislocation. Joint spaces of the bilateral hips are well preserved. Sacroiliac joints are normal. IMPRESSION: 1. No  evidence of acute pelvic or hip fracture. Electronically Signed   By: Ozell Daring M.D.   On: 01/30/2024 22:50   DG Hip Unilat W or Wo Pelvis 2-3 Views Right Result Date: 01/30/2024 CLINICAL DATA:  Clemens, lower back pain EXAM: DG HIP (WITH OR WITHOUT PELVIS) 2-3V LEFT; DG HIP (WITH OR WITHOUT PELVIS) 2-3V RIGHT COMPARISON:  12/30/2023 x-ray and CT low FINDINGS: Frontal view of the pelvis as well as frontal and frogleg lateral views of the bilateral hips are obtained. No fracture, subluxation, or dislocation. Joint spaces of the bilateral hips are well preserved. Sacroiliac joints are normal. IMPRESSION: 1. No evidence of acute pelvic or hip fracture. Electronically Signed   By: Ozell Daring M.D.   On: 01/30/2024 22:50   DG Lumbar Spine Complete Result Date: 01/30/2024 CLINICAL DATA:  Clemens, lower back pain EXAM: LUMBAR SPINE - COMPLETE 4+ VIEW COMPARISON:  07/28/2023, 12/30/2023 FINDINGS: Frontal, bilateral oblique, lateral views of the lumbar spine are obtained. There are 5 non-rib-bearing lumbar type vertebral bodies, with sacralization of the L5 level. Alignment is anatomic. No acute displaced fractures. Prominent facet hypertrophy from L2-3 through L5-S1. Sacroiliac joints are unremarkable. IMPRESSION: 1. No acute displaced fracture. Electronically Signed   By: Ozell Daring M.D.   On: 01/30/2024 22:36    Procedures Procedures    Medications Ordered in ED Medications  acetaminophen  (TYLENOL ) tablet 650 mg (has no administration in time range)    Or  acetaminophen  (TYLENOL ) suppository 650 mg (has no administration in time range)  melatonin tablet 3 mg (has no administration in time range)  ondansetron  (ZOFRAN ) injection 4 mg (has no administration in time range)  dextrose  50 % solution 50 mL (has no administration in time range)  insulin  aspart (novoLOG ) injection 0-6 Units (has no administration in time range)  cefTRIAXone  (ROCEPHIN ) 1 g in sodium chloride  0.9 % 100 mL IVPB (has no  administration in time range)  oxyCODONE  (Oxy IR/ROXICODONE ) immediate release tablet 10 mg (has no administration in time range)  dextrose   50 % solution 25 g (25 g Intravenous Given 01/30/24 2008)  lactated ringers  bolus 1,000 mL ( Intravenous Rate/Dose Change 01/30/24 2051)  fentaNYL  (SUBLIMAZE ) injection 25 mcg (25 mcg Intravenous Given 01/30/24 2152)  cephALEXin  (KEFLEX ) capsule 500 mg (500 mg Oral Given 01/30/24 2328)    ED Course/ Medical Decision Making/ A&P                                 Medical Decision Making Amount and/or Complexity of Data Reviewed Labs: ordered. Radiology: ordered.  Risk Prescription drug management. Decision regarding hospitalization.     Differential diagnosis includes but is not limited to mechanical fall, dehydration, electrolyte abnormality, URI, flu, COVID, RSV, fracture, dislocation  ED Course:  Patient has good story for mechanical fall, slipped off of her couch and landed on her bottom.  Denies any prodromal symptoms, no loss of consciousness, no chest pain or shortness of breath. Low concern for any other etiology to her fall at this time. 25mcg fentanyl  given for pain. Patient declines tylenol  for pain. I Ordered, and personally interpreted labs.  The pertinent results include:   Initial CBG at 55, after given dextrose , improved to 135 CBC with no leukocytosis, hemoglobin 10.8 which is improved since her last draw 1 month ago where it was 9.7 BNP with slight elevation in creatinine, 1.24.  Creatinine 1 month ago was 1.19.  No electrolyte abnormalities COVID, flu, RSV negative Urinalysis with large amount of leukocytes, but also squamous epithelial cells.  Patient given fluids at 150 mL/h given history of heart failure for dehydration given elevation in creatinine and weakness. Upon re-evaluation, patient states pain in her lower back has improved.  X-ray of the right and left hip, lumbar spine without any acute abnormality. Given patient reports  feeling more generally weak and some leukocytes on her urine, will treat for possible UTI.  It is unclear if this is a true UTI as the catch does have squamous epithelial cells and patient denying any urinary symptoms.  Will send urine off for culture.  Patient given first dose of Keflex  here today. Patient attempted to be ambulated by RN.  RN reported that patient unable to stand and ambulate even with significant assistance.  Will not be able to go home safely.  Impression: Difficulty with ambulation UTI Hypoglycemia  Disposition:  Admission with hospitalist Dr. Marcene  Imaging Studies ordered: I ordered imaging studies including x-ray right hip, left hip, lumbar spine I independently visualized the imaging with scope of interpretation limited to determining acute life threatening conditions related to emergency care. Imaging showed no acute abnormalities I agree with the radiologist interpretation    Consultations Obtained: I requested consultation with the hospitalist Dr. Marcene,  and discussed lab and imaging findings as well as pertinent plan - they recommend: admission for further management                 Final Clinical Impression(s) / ED Diagnoses Final diagnoses:  Cystitis  Fall, initial encounter  Weakness  Hypoglycemia    Rx / DC Orders ED Discharge Orders     None         Veta Palma, PA-C 01/31/24 FONTAINE Nicholaus Cassondra VEAR, [name] 01/31/24 1452

## 2024-01-31 ENCOUNTER — Inpatient Hospital Stay (HOSPITAL_COMMUNITY): Payer: 59

## 2024-01-31 ENCOUNTER — Encounter (HOSPITAL_COMMUNITY): Payer: Self-pay | Admitting: Internal Medicine

## 2024-01-31 DIAGNOSIS — E1151 Type 2 diabetes mellitus with diabetic peripheral angiopathy without gangrene: Secondary | ICD-10-CM | POA: Diagnosis present

## 2024-01-31 DIAGNOSIS — Y92098 Other place in other non-institutional residence as the place of occurrence of the external cause: Secondary | ICD-10-CM | POA: Diagnosis not present

## 2024-01-31 DIAGNOSIS — M545 Low back pain, unspecified: Secondary | ICD-10-CM

## 2024-01-31 DIAGNOSIS — R531 Weakness: Secondary | ICD-10-CM

## 2024-01-31 DIAGNOSIS — I10 Essential (primary) hypertension: Secondary | ICD-10-CM

## 2024-01-31 DIAGNOSIS — F411 Generalized anxiety disorder: Secondary | ICD-10-CM | POA: Diagnosis present

## 2024-01-31 DIAGNOSIS — Z7985 Long-term (current) use of injectable non-insulin antidiabetic drugs: Secondary | ICD-10-CM | POA: Diagnosis not present

## 2024-01-31 DIAGNOSIS — Z1152 Encounter for screening for COVID-19: Secondary | ICD-10-CM | POA: Diagnosis not present

## 2024-01-31 DIAGNOSIS — J309 Allergic rhinitis, unspecified: Secondary | ICD-10-CM | POA: Diagnosis present

## 2024-01-31 DIAGNOSIS — Z8249 Family history of ischemic heart disease and other diseases of the circulatory system: Secondary | ICD-10-CM | POA: Diagnosis not present

## 2024-01-31 DIAGNOSIS — W08XXXA Fall from other furniture, initial encounter: Secondary | ICD-10-CM | POA: Diagnosis present

## 2024-01-31 DIAGNOSIS — G8929 Other chronic pain: Secondary | ICD-10-CM

## 2024-01-31 DIAGNOSIS — E11649 Type 2 diabetes mellitus with hypoglycemia without coma: Secondary | ICD-10-CM | POA: Diagnosis present

## 2024-01-31 DIAGNOSIS — L304 Erythema intertrigo: Secondary | ICD-10-CM | POA: Diagnosis present

## 2024-01-31 DIAGNOSIS — I70209 Unspecified atherosclerosis of native arteries of extremities, unspecified extremity: Secondary | ICD-10-CM | POA: Diagnosis present

## 2024-01-31 DIAGNOSIS — I110 Hypertensive heart disease with heart failure: Secondary | ICD-10-CM | POA: Diagnosis present

## 2024-01-31 DIAGNOSIS — Z7409 Other reduced mobility: Secondary | ICD-10-CM | POA: Diagnosis present

## 2024-01-31 DIAGNOSIS — E785 Hyperlipidemia, unspecified: Secondary | ICD-10-CM | POA: Diagnosis present

## 2024-01-31 DIAGNOSIS — D638 Anemia in other chronic diseases classified elsewhere: Secondary | ICD-10-CM | POA: Diagnosis present

## 2024-01-31 DIAGNOSIS — L89153 Pressure ulcer of sacral region, stage 3: Secondary | ICD-10-CM | POA: Diagnosis present

## 2024-01-31 DIAGNOSIS — N3000 Acute cystitis without hematuria: Secondary | ICD-10-CM | POA: Diagnosis present

## 2024-01-31 DIAGNOSIS — B9689 Other specified bacterial agents as the cause of diseases classified elsewhere: Secondary | ICD-10-CM | POA: Diagnosis present

## 2024-01-31 DIAGNOSIS — Z833 Family history of diabetes mellitus: Secondary | ICD-10-CM | POA: Diagnosis not present

## 2024-01-31 DIAGNOSIS — J301 Allergic rhinitis due to pollen: Secondary | ICD-10-CM | POA: Diagnosis not present

## 2024-01-31 DIAGNOSIS — E162 Hypoglycemia, unspecified: Secondary | ICD-10-CM | POA: Diagnosis present

## 2024-01-31 DIAGNOSIS — R5381 Other malaise: Secondary | ICD-10-CM | POA: Diagnosis present

## 2024-01-31 DIAGNOSIS — I5032 Chronic diastolic (congestive) heart failure: Secondary | ICD-10-CM | POA: Diagnosis present

## 2024-01-31 DIAGNOSIS — E65 Localized adiposity: Secondary | ICD-10-CM | POA: Diagnosis present

## 2024-01-31 DIAGNOSIS — E119 Type 2 diabetes mellitus without complications: Secondary | ICD-10-CM

## 2024-01-31 DIAGNOSIS — E860 Dehydration: Secondary | ICD-10-CM | POA: Diagnosis present

## 2024-01-31 LAB — COMPREHENSIVE METABOLIC PANEL
ALT: 13 U/L (ref 0–44)
AST: 18 U/L (ref 15–41)
Albumin: 2.8 g/dL — ABNORMAL LOW (ref 3.5–5.0)
Alkaline Phosphatase: 61 U/L (ref 38–126)
Anion gap: 14 (ref 5–15)
BUN: 12 mg/dL (ref 8–23)
CO2: 25 mmol/L (ref 22–32)
Calcium: 9.2 mg/dL (ref 8.9–10.3)
Chloride: 101 mmol/L (ref 98–111)
Creatinine, Ser: 1.03 mg/dL — ABNORMAL HIGH (ref 0.44–1.00)
GFR, Estimated: 60 mL/min — ABNORMAL LOW (ref >60)
Glucose, Bld: 83 mg/dL (ref 70–99)
Potassium: 3.4 mmol/L — ABNORMAL LOW (ref 3.5–5.1)
Sodium: 140 mmol/L (ref 135–145)
Total Bilirubin: 1 mg/dL (ref 0.0–1.2)
Total Protein: 5.8 g/dL — ABNORMAL LOW (ref 6.5–8.1)

## 2024-01-31 LAB — CBC WITH DIFFERENTIAL/PLATELET
Abs Immature Granulocytes: 0.02 10*3/uL (ref 0.00–0.07)
Basophils Absolute: 0 10*3/uL (ref 0.0–0.1)
Basophils Relative: 1 %
Eosinophils Absolute: 0.2 10*3/uL (ref 0.0–0.5)
Eosinophils Relative: 4 %
HCT: 30.3 % — ABNORMAL LOW (ref 36.0–46.0)
Hemoglobin: 9.7 g/dL — ABNORMAL LOW (ref 12.0–15.0)
Immature Granulocytes: 0 %
Lymphocytes Relative: 35 %
Lymphs Abs: 2.3 10*3/uL (ref 0.7–4.0)
MCH: 29.3 pg (ref 26.0–34.0)
MCHC: 32 g/dL (ref 30.0–36.0)
MCV: 91.5 fL (ref 80.0–100.0)
Monocytes Absolute: 0.4 10*3/uL (ref 0.1–1.0)
Monocytes Relative: 7 %
Neutro Abs: 3.5 10*3/uL (ref 1.7–7.7)
Neutrophils Relative %: 53 %
Platelets: 203 10*3/uL (ref 150–400)
RBC: 3.31 MIL/uL — ABNORMAL LOW (ref 3.87–5.11)
RDW: 13.7 % (ref 11.5–15.5)
WBC: 6.5 10*3/uL (ref 4.0–10.5)
nRBC: 0 % (ref 0.0–0.2)

## 2024-01-31 LAB — CBG MONITORING, ED
Glucose-Capillary: 83 mg/dL (ref 70–99)
Glucose-Capillary: 80 mg/dL (ref 70–99)
Glucose-Capillary: 91 mg/dL (ref 70–99)

## 2024-01-31 LAB — URINE CULTURE

## 2024-01-31 LAB — HEMOGLOBIN A1C
Hgb A1c MFr Bld: 5.1 % (ref 4.8–5.6)
Mean Plasma Glucose: 99.67 mg/dL

## 2024-01-31 LAB — MAGNESIUM
Magnesium: 2 mg/dL (ref 1.7–2.4)
Magnesium: 1.8 mg/dL (ref 1.7–2.4)

## 2024-01-31 LAB — BRAIN NATRIURETIC PEPTIDE: B Natriuretic Peptide: 59.9 pg/mL (ref 0.0–100.0)

## 2024-01-31 LAB — GLUCOSE, CAPILLARY
Glucose-Capillary: 104 mg/dL — ABNORMAL HIGH (ref 70–99)
Glucose-Capillary: 123 mg/dL — ABNORMAL HIGH (ref 70–99)

## 2024-01-31 LAB — CK: Total CK: 79 U/L (ref 38–234)

## 2024-01-31 LAB — VITAMIN B12: Vitamin B-12: 450 pg/mL (ref 180–914)

## 2024-01-31 LAB — TSH: TSH: 1.943 u[IU]/mL (ref 0.350–4.500)

## 2024-01-31 MED ORDER — ONDANSETRON HCL 4 MG/2ML IJ SOLN: 4.0000 mg | Freq: Four times a day (QID) | INTRAMUSCULAR | Status: DC | PRN

## 2024-01-31 MED ORDER — FLUTICASONE PROPIONATE 50 MCG/ACT NA SUSP
1.0000 | Freq: Every day | NASAL | Status: DC
  Administered 2024-01-31 – 2024-02-02 (×3): 1 via NASAL
  Filled 2024-01-31: qty 16

## 2024-01-31 MED ORDER — ACETAMINOPHEN 325 MG PO TABS: 650.0000 mg | ORAL_TABLET | Freq: Four times a day (QID) | ORAL | Status: DC | PRN

## 2024-01-31 MED ORDER — OXYCODONE HCL 5 MG PO TABS
10.0000 mg | ORAL_TABLET | Freq: Two times a day (BID) | ORAL | Status: DC | PRN
  Administered 2024-01-31: 10 mg via ORAL
  Filled 2024-01-31: qty 2

## 2024-01-31 MED ORDER — OXYCODONE HCL 5 MG PO TABS
10.0000 mg | ORAL_TABLET | Freq: Four times a day (QID) | ORAL | Status: DC | PRN
  Administered 2024-01-31 – 2024-02-02 (×7): 10 mg via ORAL
  Filled 2024-01-31 (×7): qty 2

## 2024-01-31 MED ORDER — DEXTROSE 50 % IV SOLN: 1.0000 | INTRAVENOUS | Status: DC | PRN

## 2024-01-31 MED ORDER — SODIUM CHLORIDE 0.9 % IV SOLN
1.0000 g | Freq: Every day | INTRAVENOUS | Status: DC
  Administered 2024-01-31 (×2): 1 g via INTRAVENOUS
  Filled 2024-01-31 (×2): qty 10

## 2024-01-31 MED ORDER — MELATONIN 3 MG PO TABS: 3.0000 mg | ORAL_TABLET | Freq: Every evening | ORAL | Status: DC | PRN

## 2024-01-31 MED ORDER — FAMOTIDINE 20 MG PO TABS
40.0000 mg | ORAL_TABLET | Freq: Every day | ORAL | Status: DC
  Administered 2024-01-31 – 2024-02-02 (×3): 40 mg via ORAL
  Filled 2024-01-31 (×3): qty 2

## 2024-01-31 MED ORDER — INSULIN ASPART 100 UNIT/ML IJ SOLN: 0.0000 [IU] | Freq: Three times a day (TID) | INTRAMUSCULAR | Status: DC

## 2024-01-31 MED ORDER — CLONAZEPAM 1 MG PO TABS
1.0000 mg | ORAL_TABLET | Freq: Two times a day (BID) | ORAL | Status: DC | PRN
  Administered 2024-01-31 – 2024-02-01 (×3): 1 mg via ORAL
  Filled 2024-01-31 (×3): qty 1

## 2024-01-31 MED ORDER — ACETAMINOPHEN 650 MG RE SUPP: 650.0000 mg | Freq: Four times a day (QID) | RECTAL | Status: DC | PRN

## 2024-01-31 MED ORDER — PANTOPRAZOLE SODIUM 40 MG PO TBEC
40.0000 mg | DELAYED_RELEASE_TABLET | Freq: Every day | ORAL | Status: DC
  Administered 2024-01-31 – 2024-02-02 (×3): 40 mg via ORAL
  Filled 2024-01-31 (×3): qty 1

## 2024-01-31 MED ORDER — NALOXONE HCL 0.4 MG/ML IJ SOLN: 0.4000 mg | INTRAMUSCULAR | Status: DC | PRN

## 2024-01-31 NOTE — Plan of Care (Signed)

## 2024-01-31 NOTE — ED Notes (Signed)
 Pt currently being evaluated by PT at bedside.

## 2024-01-31 NOTE — Progress Notes (Signed)
 Patient seen and examined.  Admitted early morning hours by nighttime hospitalist.  She is still in the hallway.  Patient at rest denies any complaints.  She did complain of dysuria for the last 3 days. In brief, 68 year old with history of chronic low back pain, type 2 diabetes on Mounjaro, chronic diastolic heart failure, essential hypertension, chronic debility who was not able to move around for the last 2 days so came to the ER.  She was found to have UTI and worsening lower extremity weakness is with previous history of chronic low back pain and impaired mobility.  Treating for UTI.  PT OT and mobilize.  Continue IV antibiotics today.  She may need short-term rehab. Hypoglycemic due to poor oral intake.  Hold all insulin  agents.  Allow regular diet.   Same-day admit.  No charge visit.

## 2024-01-31 NOTE — Evaluation (Signed)
 Physical Therapy Evaluation Patient Details Name: Maria [name] MRN: [id number] DOB: [birth year] Today's Date: 01/31/2024  History of Present Illness  Maria [name] is a 68 y.o. 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 She is tearful during assessment. Patient is motivated to improve, feels like she was let go to early from SNF. Patient requires mod A for supine to sit and min A for sit to supine. She is unable to safely attempt standing in hallway with +1 assist from high stretcher. Patient will benefit from continued skilled PT to improve strength and safety with mobility.             If plan is discharge home, recommend the following: Assist for transportation;Assistance with cooking/housework;Help with stairs or ramp for entrance;Two people to help with walking and/or transfers;A lot of help with bathing/dressing/bathroom   Can travel by private vehicle   No    Equipment Recommendations None recommended by PT  Recommendations for Other Services       Functional Status Assessment Patient has had a recent decline in their functional status and demonstrates the ability to make significant improvements in function in a reasonable and predictable amount of time.     Precautions / Restrictions Precautions Precautions: Fall Precaution Comments: B LE edema/cellulitis? Restrictions Weight Bearing Restrictions Per Provider Order: No      Mobility  Bed Mobility Overal bed mobility: Needs Assistance Bed Mobility: Supine to Sit   Sidelying to sit: Mod assist, HOB elevated, Used rails Supine to sit:  Mod assist, HOB elevated, Used rails Sit to supine: Min assist, Used rails, HOB elevated   General bed mobility comments: Assist for legs back to bed    Transfers                   General transfer comment: did not attempt from stretcher in hallway of ED. Will benefit from +2 for safety with attempts at standing/walking    Ambulation/Gait               General Gait Details: unsafe to attempt at this time  Stairs            Wheelchair Mobility     Tilt Bed    Modified Rankin (Stroke Patients Only)       Balance Overall balance assessment: Needs assistance, History of Falls Sitting-balance support: Bilateral upper extremity supported, Feet unsupported Sitting balance-Leahy Scale: Good Sitting balance - Comments: props on UEs, CGA                                     Pertinent Vitals/Pain Pain Assessment Pain Assessment: Faces Faces Pain Scale: Hurts a little bit Pain Location: B LEs Pain Descriptors / Indicators: Discomfort    Home Living Family/patient expects to be discharged to:: Private residence Living Arrangements: Alone   Type of Home: Apartment Home Access: Stairs to enter   [employer] of Steps: stairs spaced out from parking lot to door- ~ 6 she does not leave home independently   Home Layout: One level Home Equipment: Hospital bed;Tub bench;Rolling Walker (  2 wheels);Rollator (4 wheels);BSC/3in1;Wheelchair - manual Additional Comments: bedside commode frame placed over toilet, received a wide Wheelchair recently that does not fit through doors.    Prior Function Prior Level of Function : History of Falls (last six months);Independent/Modified Independent             Mobility Comments: Uses a RW in home;Rollator when she goes out; cannot fit RW through bathroom door. holds onto wall, sink ADLs Comments: BSC over toilet. Orders groceries, prescriptions, insurance provides transport to MD when needed      Extremity/Trunk Assessment   Upper Extremity Assessment Upper Extremity Assessment: Defer to OT evaluation    Lower Extremity Assessment Lower Extremity Assessment: Generalized weakness RLE Deficits / Details: distal LE edematous, erythematous; limited ROM d/t body habitus; strength grossly 2+/5 LLE Deficits / Details: distal LE edematous, erythematous; limited ROM d/t body habitus; strength grossly 2+/5    Cervical / Trunk Assessment Cervical / Trunk Exceptions: some limitations due to body habitus, chronic back pain  Communication   Communication Communication: No apparent difficulties Cueing Techniques: Verbal cues  Cognition Arousal: Alert Behavior During Therapy: WFL for tasks assessed/performed, Lability Overall Cognitive Status: Within Functional Limits for tasks assessed                                 General Comments: patient anxious, labile        General Comments      Exercises Other Exercises Other Exercises: LAQ seated edge of stretcher x 15 B   Assessment/Plan    PT Assessment Patient needs continued PT services  PT Problem List Decreased strength;Decreased range of motion;Decreased activity tolerance;Decreased balance;Decreased mobility;Decreased knowledge of use of DME;Cardiopulmonary status limiting activity;Decreased cognition;Decreased safety awareness;Obesity       PT Treatment Interventions DME instruction;Therapeutic exercise;Gait training;Balance training;Functional mobility training;Therapeutic activities;Patient/family education    PT Goals (Current goals can be found in the Care Plan section)  Acute Rehab PT Goals Patient Stated Goal: to get better, wants to go to ALF PT Goal Formulation: With patient Time For Goal Achievement: 02/14/24 Potential to Achieve Goals: Fair    Frequency Min 1X/week     Co-evaluation               AM-PAC PT "6 Clicks" Mobility  Outcome Measure Help needed turning from your back to  your side while in a flat bed without using bedrails?: A Lot Help needed moving from lying on your back to sitting on the side of a flat bed without using bedrails?: A Lot Help needed moving to and from a bed to a chair (including a wheelchair)?: Total Help needed standing up from a chair using your arms (e.g., wheelchair or bedside chair)?: Total Help needed to walk in hospital room?: Total Help needed climbing 3-5 steps with a railing? : Total 6 Click Score: 8    End of Session   Activity Tolerance: Patient limited by fatigue Patient left: in bed Nurse Communication: Mobility status PT Visit Diagnosis: Muscle weakness (generalized) (M62.81);Difficulty in walking, not elsewhere classified (R26.2);Repeated falls (R29.6);Other abnormalities of gait and mobility (R26.89)    Time: 1355-1415 PT Time Calculation (min) (ACUTE ONLY): 20 min   Charges:   PT Evaluation $PT Eval Moderate Complexity: 1 Mod   PT General Charges $$ ACUTE PT VISIT: 1 Visit         Glynda Soliday, PT, GCS 01/31/24,2:42 PM

## 2024-01-31 NOTE — H&P (Signed)
 History and Physical      Maria [name] BJY:[id number] DOB: 1956/02/07 DOA: 01/30/2024; DOS: 01/31/2024  PCP: Sharma Dears, NP  Patient coming from: home   I have personally briefly reviewed patient's old medical records in [facility]  Chief Complaint: Generalized weakness  HPI: Maria [name] is a 68 y.o. female with medical history significant for chronic low back pain, type 2 diabetes mellitus, chronic diastolic heart failure, essential pretension, anemia of heart disease associated baseline hemoglobin 10-12, who is admitted to [facility] on 01/30/2024 with generalized weakness after presenting from home to [facility] ED complaining of generalized weakness.   The patient reports 1 week of progressive generalized weakness in the absence of any acute focal weakness.  As a consequence of this generalized weakness, the patient conveys progressive difficulty with ambulating and completing her ADLs over the course of the last week.  On 01/30/2024, she reports that due to her generalized weakness, she was unable to get up traditionally off of the sofa.  An attempt to get up off of the couch, she barrel rolled off the sofa onto the floor, but was then not able to extricate herself from the floor.  She was in close recommended to a phone, is able to contact help who were able to help her off the floor after very limited duration of time spent on the floor.  She denies any acute focal weakness or any acute focal sensory changes.  She confirms chronic low back pain, without a significant intensification recently thereof.  In barrel rolling off the sofa she denies any acute arthralgias or myalgias.  Did not hit her head as a component of this process, and denies any associated loss consciousness.  Not on any blood thinners as an outpatient.  Denies any recent subjective fever, chills, rigors, generalized myalgias.  Denies any recent dysuria, cough, shortness of breath, neck stiffness, rash,  abdominal pain.  No recent chest pain, shortness of breath.  She confirms a history of type 2 diabetes mellitus for which she is on Mounjaro.  Most recent globin A1c was noted to be 6.0% in July 2024.  Denies outpatient use of insulin  or oral hypoglycemic agents.    ED Course:  Vital signs in the ED were notable for the following: Afebrile; heart rates in 70s to 80s; systolic blood pressures in the 1 teens to 140s; respiratory rate 16-20, oxygen saturation 95 to 1% on room air.  Labs were notable for the following: BMP notable for the following: Sodium 136, bicarbonate 25, creatinine 1.24 compared to most recent prior creatinine did point 1.19 on 12/29/2023, glucose 80.  CBG trend notable for the following values: 55 followed by 69 followed by 6 7 5-65 prompting initiation of amp of D50, following which CBG increased to 135.  CBC notable for the following: Will present count 9000, hemoglobin 10.8 associated Neuraceq/Norocarp properties and nonelevated RDW and relative demonstrates a prior hemoglobin of 9.7 on 12/29/2023 Complan count 244.  Urinalysis associated with hazy appearing specimen and notable for 21-50 white blood cells, large leukocyte esterase.  COVID-19, influenza, RSV PCR were all negative.  Urine culture collected.  Per my interpretation, EKG in ED demonstrated the following: EKG was performed in the ED today, but not yet released from my independent interpretation.  Imaging in the ED, per corresponding formal radiology read, was notable for the following: Plain films of the bilateral hips as well as L-spine showed no evidence of acute process, including no evidence  of acute fracture.  While in the ED, the following were administered: Keflex , amp of D50, fentanyl  25 mg IV x 1, lactated Ringer 's in the medicals.  Subsequently, the patient was admitted for further evaluation management and was in generalized weakness in the setting of acute cystitis as well as hypoglycemia.    Review of  Systems: As per HPI otherwise 10 point review of systems negative.   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 2010   CERVICAL DISC SURGERY  may 24th 2010   COLONOSCOPY WITH PROPOFOL  N/A 07/10/2016   Procedure: COLONOSCOPY WITH PROPOFOL ;  Surgeon: Danette Duos, MD;  Location: [facility];  Service: Gastroenterology;  Laterality: N/A;   COLONOSCOPY WITH PROPOFOL  N/A 09/26/2019   Procedure: COLONOSCOPY WITH PROPOFOL ;  Surgeon: Ace Holder, MD;  Location: [facility];  Service: Gastroenterology;  Laterality: N/A;   ESOPHAGOGASTRODUODENOSCOPY (EGD) WITH PROPOFOL  N/A 07/10/2016   Procedure: ESOPHAGOGASTRODUODENOSCOPY (EGD) WITH PROPOFOL ;  Surgeon: Danette Duos, MD;  Location: [facility];  Service: Gastroenterology;  Laterality: N/A;   POLYPECTOMY  09/26/2019   Procedure: POLYPECTOMY;  Surgeon: Ace Holder, MD;  Location: [facility];  Service: Gastroenterology;;   RADIOLOGY WITH ANESTHESIA N/A 07/30/2023   Procedure: MRI WITH ANESTHESIA - THORACIC LUMBAR WITHOUT CONSTRAST;  Surgeon: Radiologist, Medication, MD;  Location: [facility];  Service: Radiology;  Laterality: N/A;   s/p  ovary cyst     s/p right knee arthroscopy     Dr. Agatha Horsfall ortho    Social History:  reports that she quit smoking about 15 years ago. Her smoking use included cigarettes. She started smoking about 45 years ago. She has never used smokeless tobacco. She reports that she does not drink alcohol  and does not use drugs.   Allergies  Allergen Reactions   Amoxicillin -Pot Clavulanate Nausea And Vomiting    Projectile vomiting   Adhesive [Tape] Other (See Comments)    Tears skin off - use paper tape    Codeine Other (See Comments)    hallucinations   Crestor  [Rosuvastatin  Calcium ] Other (See Comments)    Severe muscle cramps   Gabapentin  Other (See Comments)    Trembling   Lyrica [Pregabalin] Other (See Comments)    weakness   Morphine  And Codeine Nausea And Vomiting and Other (See Comments)    Migraine headaches   Vicodin [Hydrocodone-Acetaminophen ] Other (See Comments)    hallucinations   Latex Rash    Family History  Problem Relation Age of Onset   Heart disease Mother    Hypertension Mother    Diabetes Mother    Heart failure Mother    Asthma Sister  Anxiety disorder Sister    Depression Sister    Hypertension Father    Asthma Daughter    Bipolar disorder Daughter    Cancer Maternal Uncle        [place]   Cancer Other        ovarian   Liver cancer Paternal Grandmother        ????    Family history reviewed and not pertinent    Prior to Admission medications   Medication Sig Start Date End Date Taking? Authorizing Provider  acetaminophen  (TYLENOL ) 650 MG CR tablet Take 1,300 mg by mouth every 8 (eight) hours as needed for pain.    [provider]  albuterol  (VENTOLIN  HFA) 108 (90 Base) MCG/ACT inhaler Inhale 1 puff into the lungs every 6 (six) hours as needed for wheezing or shortness of breath.    [provider]  cholecalciferol  (VITAMIN D3) 25 MCG (1000 UNIT) tablet Take 1,000 Units by mouth daily.    [provider]  clonazePAM   (KLONOPIN ) 1 MG tablet Take 1 tablet (1 mg total) by mouth 2 (two) times daily as needed. for anxiety Patient taking differently: Take 1 mg by mouth 2 (two) times daily. 10/11/23   Modena Andes, MD  cyanocobalamin  1000 MCG tablet Take 1 tablet (1,000 mcg total) by mouth daily. 07/18/23   Limmie Ren, MD  cyclobenzaprine  (FEXMID ) 7.5 MG tablet Take 7.5 mg by mouth 3 (three) times daily as needed for muscle spasms. 11/23/23   [provider]  DULoxetine  (CYMBALTA ) 20 MG capsule Take 20 mg by mouth at bedtime. Patient not taking: Reported on 12/12/2023    [provider]  famotidine  (PEPCID ) 40 MG tablet Take 40 mg by mouth daily. 10/04/23   [provider]  fluticasone  (FLONASE ) 50 MCG/ACT nasal spray Place 1 spray into both nostrils daily. 12/07/22   [provider]  Metamucil Fiber CHEW Chew 3 tablets by mouth 3 times/day as needed-between meals & bedtime (Constipation).    [provider]  metoprolol  succinate (TOPROL -XL) 25 MG 24 hr tablet Take 1 tablet (25 mg total) by mouth daily. 07/18/23   Limmie Ren, MD  NARCAN  4 MG/0.1ML LIQD nasal spray kit Place 0.4 mg into the nose daily as needed (opioid overdose). 04/25/19   [provider]  ondansetron  (ZOFRAN -ODT) 4 MG disintegrating tablet Take 4 mg by mouth as needed for nausea or vomiting. 09/14/23   [provider]  Oxycodone  HCl 10 MG TABS Take 1 tablet (10 mg total) by mouth in the morning and at bedtime. 11/30/23   Faith Homes, MD  pantoprazole  (PROTONIX ) 40 MG tablet TAKE ONE TABLET BY MOUTH DAILY 01/29/22   Roslyn Coombe, MD  Potassium Chloride  ER 20 MEQ TBCR Take 40 mEq by mouth in the morning and at bedtime. 11/16/23   [provider]  pramipexole  (MIRAPEX ) 0.5 MG tablet Take 0.5 mg by mouth at bedtime as needed. 12/16/23   [provider]  spironolactone  (ALDACTONE ) 25 MG tablet Take 0.5 tablets (12.5 mg total) by mouth daily. Patient taking  differently: Take 25 mg by mouth daily. 08/14/22   Lasalle Pointer, NP  tirzepatide [facility]) 15 MG/0.5ML Pen Inject 15 mg into the skin once a week.    [provider]  torsemide  (DEMADEX ) 20 MG tablet Take 2 tablets (40 mg total) by mouth daily. 11/30/23   Faith Homes, MD     Objective    Physical Exam: Vitals:   01/30/24 1517 01/30/24 1536 01/30/24  2032 01/31/24 0002  BP: 117/60  (!) 114/58 (!) 117/51  Pulse: 74  77 85  Resp: 20  19 20   Temp: 97.7 F (36.5 C)  97.8 F (36.6 C) 97.6 F (36.4 C)  TempSrc: Oral  Oral Oral  SpO2: 97%  98% 100%  Weight:  (!) 153.1 kg    Height:  5\' 3"  (1.6 m)      General: appears to be stated age; alert, oriented Skin: warm, dry, no rash Head:  AT/[place] Mouth:  Oral mucosa membranes appear moist, normal dentition Neck: supple; trachea midline Heart:  RRR; did not appreciate any M/R/G Lungs: CTAB, did not appreciate any wheezes, rales, or rhonchi Abdomen: + BS; soft, ND, NT Vascular: 2+ pedal pulses b/l; 2+ radial pulses b/l Extremities: no peripheral edema, no muscle wasting Neuro: strength and sensation intact in upper and lower extremities b/l      Labs on Admission: I have personally reviewed following labs and imaging studies  CBC: Recent Labs  Lab 01/30/24 1605  WBC 9.8  HGB 10.8*  HCT 33.8*  MCV 92.9  PLT 244   Basic Metabolic Panel: Recent Labs  Lab 01/30/24 1605  NA 136  K 4.8  CL 99  CO2 25  GLUCOSE 80  BUN 17  CREATININE 1.24*  CALCIUM  9.1   GFR: Estimated Creatinine Clearance: 65.3 mL/min (A) (by C-G formula based on SCr of 1.24 mg/dL (H)). Liver Function Tests: No results for input(s): "AST", "ALT", "ALKPHOS", "BILITOT", "PROT", "ALBUMIN" in the last 168 hours. No results for input(s): "LIPASE", "AMYLASE" in the last 168 hours. No results for input(s): "AMMONIA" in the last 168 hours. Coagulation Profile: No results for input(s): "INR", "PROTIME" in the last 168 hours. Cardiac Enzymes: No  results for input(s): "CKTOTAL", "CKMB", "CKMBINDEX", "TROPONINI" in the last 168 hours. BNP (last 3 results) No results for input(s): "PROBNP" in the last 8760 hours. HbA1C: No results for input(s): "HGBA1C" in the last 72 hours. CBG: Recent Labs  Lab 01/30/24 1702 01/30/24 1800 01/30/24 1910 01/30/24 1931 01/30/24 2029  GLUCAP 55* 69* 67* 65* 135*   Lipid Profile: No results for input(s): "CHOL", "HDL", "LDLCALC", "TRIG", "CHOLHDL", "LDLDIRECT" in the last 72 hours. Thyroid  Function Tests: No results for input(s): "TSH", "T4TOTAL", "FREET4", "T3FREE", "THYROIDAB" in the last 72 hours. Anemia Panel: No results for input(s): "VITAMINB12", "FOLATE", "FERRITIN", "TIBC", "IRON", "RETICCTPCT" in the last 72 hours. Urine analysis:    Component Value Date/Time   COLORURINE YELLOW 01/30/2024 2154   APPEARANCEUR HAZY (A) 01/30/2024 2154   LABSPEC 1.016 01/30/2024 2154   PHURINE 5.0 01/30/2024 2154   GLUCOSEU NEGATIVE 01/30/2024 2154   GLUCOSEU NEGATIVE 01/30/2022 1342   HGBUR SMALL (A) 01/30/2024 2154   BILIRUBINUR NEGATIVE 01/30/2024 2154   KETONESUR NEGATIVE 01/30/2024 2154   PROTEINUR NEGATIVE 01/30/2024 2154   UROBILINOGEN 0.2 01/30/2022 1342   NITRITE NEGATIVE 01/30/2024 2154   LEUKOCYTESUR LARGE (A) 01/30/2024 2154    Radiological Exams on Admission: DG Hip Unilat W or Wo Pelvis 2-3 Views Left Result Date: 01/30/2024 CLINICAL DATA:  Marvell Slider, lower back pain EXAM: DG HIP (WITH OR WITHOUT PELVIS) 2-3V LEFT; DG HIP (WITH OR WITHOUT PELVIS) 2-3V RIGHT COMPARISON:  12/30/2023 x-ray and CT low FINDINGS: Frontal view of the pelvis as well as frontal and frogleg lateral views of the bilateral hips are obtained. No fracture, subluxation, or dislocation. Joint spaces of the bilateral hips are well preserved. Sacroiliac joints are normal. IMPRESSION: 1. No evidence of acute pelvic or hip fracture.  Electronically Signed   By: Bobbye Burrow M.D.   On: 01/30/2024 22:50   DG Hip Unilat W or Wo  Pelvis 2-3 Views Right Result Date: 01/30/2024 CLINICAL DATA:  Marvell Slider, lower back pain EXAM: DG HIP (WITH OR WITHOUT PELVIS) 2-3V LEFT; DG HIP (WITH OR WITHOUT PELVIS) 2-3V RIGHT COMPARISON:  12/30/2023 x-ray and CT low FINDINGS: Frontal view of the pelvis as well as frontal and frogleg lateral views of the bilateral hips are obtained. No fracture, subluxation, or dislocation. Joint spaces of the bilateral hips are well preserved. Sacroiliac joints are normal. IMPRESSION: 1. No evidence of acute pelvic or hip fracture. Electronically Signed   By: Bobbye Burrow M.D.   On: 01/30/2024 22:50   DG Lumbar Spine Complete Result Date: 01/30/2024 CLINICAL DATA:  Marvell Slider, lower back pain EXAM: LUMBAR SPINE - COMPLETE 4+ VIEW COMPARISON:  07/28/2023, 12/30/2023 FINDINGS: Frontal, bilateral oblique, lateral views of the lumbar spine are obtained. There are 5 non-rib-bearing lumbar type vertebral bodies, with sacralization of the L5 level. Alignment is anatomic. No acute displaced fractures. Prominent facet hypertrophy from L2-3 through L5-S1. Sacroiliac joints are unremarkable. IMPRESSION: 1. No acute displaced fracture. Electronically Signed   By: Bobbye Burrow M.D.   On: 01/30/2024 22:36      Assessment/Plan   Principal Problem:   Generalized weakness Active Problems:   GAD (generalized anxiety disorder)   Essential hypertension   Allergic rhinitis   Chronic low back pain   DM2 (diabetes mellitus, type 2) (HCC)   Acute cystitis   Hypoglycemia   Anemia of chronic disease     #) Generalized weakness: 1 week duration of progressive generalized weakness, in the absence of any evidence of acute focal neurologic deficits, including no evidence of acute focal weakness to suggest acute CVA.  Has resulted in difficulty ambulating and completing her ADLs independently, which, at baseline, she is fully capable of doing per her report. suspect contribution from physiologic stress stemming from presenting acute  infection in the form of acute cystitis as well as secondary contribution from refractory hypoglycemia.  No e/o additional infectious process at this time, including COVID, influenza, RSV PCR which were all negative.  Will also check chest x-ray to further assess.  Will further eval for any additional contributions from endocrine/metabolic sources, as detailed below.   Plan: work-up and management of presenting acute cystitis and hypoglycemia, as described below. PT/OT consults ordered for the AM. Fall precautions. CMP/CBC in the AM. Check TSH, serum Mg level. Check chest x-ray, B12, CPK level, TSH.                    #) Acute cystitis: In the setting of her progressive generalized weakness, there is suspicion for underlying/contributory urinary tract infection given presenting urinalysis that was suggestive of UTI in the form of hazy appearing specimen with significant pyuria and large leukocyte esterase.  Urine culture was collected prior to initiation of Keflex , which will be escalated to Rocephin .  In the absence of any SIRS criteria, including afebrile and absence of leukocytosis, SIRS criteria not met for sepsis at this time.  She appears again been stable.  Plan: Follow-up result urine culture.  Start Rocephin .  CBC in the morning.  Prn acetaminophen  for fever.                   #) Hypoglycemia superimposed on history of type 2 diabetes mellitus: In the context of a documented history of type 2 diabetes mellitus for  which she is on Mounjaro as an outpatient, with most recent hemoglobin A1c noted to be 6.0% when checked in July 2024, she has had multiple hypoglycemic readings in the emergency department this evening, with values in the 50s to 60s, potentially representing a contributory factor leading to her presenting generalized weakness.  Blood sugar is now improved following of D50, but will continue to closely monitor ensuing CBG trend as outlined below.   Suspect hypoglycemia contribution in the setting of acute infection from presenting acute cystitis, as above.  As noted above, she is on Medrol  as an outpatient but otherwise on no oral hypoglycemic agents nor insulin  has an outpatient.  Plan: Accu-Cheks every 2 hours x 2 occurrences followed by before every meal and at bedtime CBG monitoring.  Add on hemoglobin A1c level.  Hold home Mounjaro for now. Further evaluation management of presenting acute cystitis, as above.  Check chest x-ray.                  #) Chronic low back pain: Documented history of such, with patient on chronic opioid medications as an outpatient, namely oxycodone  IR 10 mg p.o. twice daily as needed.  Denies any acute worsening of her chronic low back pain, and Clinacox of the lumbar spine today show no evidence of acute process.  Plan: Resume outpatient prn oxycodone .  This has ruptured.  Prn Narcan .  Repeat CMP in the morning.                     #) Chronic diastolic heart failure: documented history of such, with most recent echocardiogram performed February 2024, which is notable for LVEF 65 to 70%, grade 2 diastolic dysfunction and normal right ventricular systolic function and trivial mitral rotation. No clinical evidence to suggest acutely decompensated heart failure at this time. home diuretic regimen reportedly consists of the following: Torsemide  40 mg p.o. daily as well as spironolactone .   Plan: monitor strict I's & O's and daily weights. Repeat CMP in AM. Check serum mag level.  Add on BNP.  Follow-up for result of chest x-ray, which has been ordered as component of evaluation of her presenting generalized weakness, as above.  Plan for reevaluation volume status in the morning to determine appropriate timing of resumption of her outpatient diuretic medications.                   #) Essential Hypertension: documented h/o such, with outpatient antihypertensive regimen  including metoprolol  succinate, torsemide , spironolactone .  SBP's in the ED today: 1 teens to 140s mmHg. in the setting of her presenting acute infection, will be conservative with resumption of outpatient hypertensive medications for now, as below.  Plan: Close monitoring of subsequent BP via routine VS. resume home metoprolol  succinate.  Holding home spironolactone  and torsemide  for now, plan to reevaluate leg status in the morning to determine appropriate timing of resumption of these outpatient diuretic medications.  Monitor strict I's and O's and daily weights.  Monitor on telemetry.  CMP in the morning.                    #) Generalized anxiety disorder: documented h/o such. On scheduled clonazepam  on twice daily basis as outpatient.    Plan: In the setting of her presenting generalized weakness, will resume home clonazepam , but changed to prn utilization during this hospitalization.                  #) Allergic  Rhinitis: documented h/o such, on scheduled intranasal Flonase  as outpatient.    Plan: cont home Flonase .                   #) Anemia of chronic disease: Documented history of such, a/w with baseline hgb range 10-12, with presenting hgb consistent with this range, in the absence of any overt evidence of active bleed.     Plan: Repeat CBC in the morning.       DVT prophylaxis: SCD's   Code Status: Full code Family Communication: none Disposition Plan: Per Rounding Team Consults called: none;  Admission status: Inpatient     I SPENT GREATER THAN 75  MINUTES IN CLINICAL CARE TIME/MEDICAL DECISION-MAKING IN COMPLETING THIS ADMISSION.      Guneet Delpino B Jerod Mcquain DO [facility]  From 7PM - 7AM   01/31/2024, 12:10 AM

## 2024-01-31 NOTE — ED Notes (Signed)
 Patient transported to X-ray

## 2024-02-01 DIAGNOSIS — R531 Weakness: Secondary | ICD-10-CM | POA: Diagnosis not present

## 2024-02-01 LAB — GLUCOSE, CAPILLARY
Glucose-Capillary: 120 mg/dL — ABNORMAL HIGH (ref 70–99)
Glucose-Capillary: 138 mg/dL — ABNORMAL HIGH (ref 70–99)
Glucose-Capillary: 142 mg/dL — ABNORMAL HIGH (ref 70–99)
Glucose-Capillary: 73 mg/dL (ref 70–99)
Glucose-Capillary: 86 mg/dL (ref 70–99)

## 2024-02-01 MED ORDER — MEDIHONEY WOUND/BURN DRESSING EX PSTE
1.0000 | PASTE | Freq: Every day | CUTANEOUS | Status: DC
  Administered 2024-02-01 – 2024-02-02 (×2): 1 via TOPICAL
  Filled 2024-02-01: qty 44

## 2024-02-01 MED ORDER — POTASSIUM CHLORIDE CRYS ER 20 MEQ PO TBCR
40.0000 meq | EXTENDED_RELEASE_TABLET | Freq: Two times a day (BID) | ORAL | Status: DC
  Administered 2024-02-01 – 2024-02-02 (×3): 40 meq via ORAL
  Filled 2024-02-01 (×3): qty 2

## 2024-02-01 MED ORDER — TORSEMIDE 20 MG PO TABS
40.0000 mg | ORAL_TABLET | Freq: Every day | ORAL | Status: DC
  Administered 2024-02-01 – 2024-02-02 (×2): 40 mg via ORAL
  Filled 2024-02-01 (×2): qty 2

## 2024-02-01 MED ORDER — GERHARDT'S BUTT CREAM
TOPICAL_CREAM | Freq: Two times a day (BID) | CUTANEOUS | Status: DC
  Administered 2024-02-02: 1 via TOPICAL
  Filled 2024-02-01: qty 60

## 2024-02-01 MED ORDER — GERHARDT'S BUTT CREAM: TOPICAL_CREAM | CUTANEOUS | Status: DC | PRN

## 2024-02-01 MED ORDER — CEPHALEXIN 500 MG PO CAPS
500.0000 mg | ORAL_CAPSULE | Freq: Three times a day (TID) | ORAL | Status: DC
  Administered 2024-02-01 – 2024-02-02 (×4): 500 mg via ORAL
  Filled 2024-02-01 (×3): qty 1

## 2024-02-01 NOTE — Consult Note (Addendum)
WOC Nurse Consult Note:  this patient is familiar to [facility] team from previous admission 11/2023 when this wound was documented as a stage 2 by our team  Reason for Consult:inner buttock wound  Wound type: 1. Stage 3 Pressure Injury coccyx  2. Moisture Associated Skin Damage buttocks 3.  Intertriginous dermatitis underneath breasts and pannus  ICD-10 CM Codes for Irritant Dermatitis  L30.4  - Erythema intertrigo. Also used for abrasion of the hand, chafing of the skin, dermatitis due to sweating and friction, friction dermatitis, friction eczema, and genital/thigh intertrigo.  Pressure Injury POA: Yes Measurement see nursing flowsheet  Wound bed: 75% red moist 25% yellow  Drainage (amount, consistency, odor)  see nursing flowsheet  Periwound: mild erythema and partial thickness skin loss from moisture associated skin damage  Dressing procedure/placement/frequency: Cleanse coccyx wound with NS, apply Medihoney to wound bed daily, cover with dry gauze. Coat surrounding skin with Gerhardt's butt cream.  Secure dressing with silicone foam or ABD pad whichever is preferred.  Cleanse underneath breasts and pannus with Vashe wound cleanser [place] [phone]), apply Tommy Rainwater [place] [phone]) to area as follows:  Measure and cut length of InterDry to fit in skin folds that have skin breakdown Tuck InterDry fabric into skin folds in a single layer, allow for 2 inches of overhang from skin edges to allow for wicking to occur May remove to bathe; dry area thoroughly and then tuck into affected areas again Do not apply any creams or ointments when using InterDry DO NOT THROW AWAY FOR 5 DAYS unless soiled with stool DO NOT [facility] product, this will inactivate the silver in the material  New sheet of Interdry should be applied after 5 days of use if patient continues to have skin breakdown     POC discussed with bedside nurse. WOC team will not follow. Re-consult if further needs arise.   Thank you,     Priscella Mann MSN, RN-BC, [employer] [phone]

## 2024-02-01 NOTE — Progress Notes (Signed)
PROGRESS NOTE    Maria [name]  GNF:[id number] DOB: 1955/12/27 DOA: 01/30/2024 PCP: Nona Dell, NP    Brief Narrative:  68 year old with history of chronic low back pain, type 2 diabetes on Mounjaro, chronic diastolic heart failure, essential hypertension, chronic debility who was not able to move around for the last 2 days so came to the ER. She was found to have UTI and worsening lower extremity weakness is with previous history of chronic low back pain and impaired mobility.   Subjective: Patient seen and examined.  Needed 2 people to assist her out of the bed.  Also complains of swelling of her arms and legs.  Agreeable to referral to a SNF. Assessment & Plan:   Acute on chronic weakness, progressive debility: Patient with progressive debility, chronic low back pain and deconditioning.  No focal neurological deficits.  Electrolytes adequate. B12, TSH and CPK within normal limits. Continue to aggressively work with therapies.  Refer to SNF for rehab.  Patient may also need more assistance at home or placement into assisted living facility down the road.  Acute UTI present on admission: Patient was symptomatic.  Currently improved.  Received Rocephin x 2.  Urine culture with mixed flora.  Will complete 5 days of oral antibiotic therapy with Keflex.  Chronic medical issues including Type 2 diabetes on Mounjaro, known A1c less than 6.  Hypoglycemia with low intake.  Hold Mounjaro.  Will keep on low-dose sliding scale. Chronic diastolic heart failure: Currently euvolemic.  Resume home dose of torsemide and potassium supplements. Essential hypertension, blood pressure is stable as above on metoprolol, torsemide and aspirin lactone. Generalized anxiety disorder, scheduled clonazepam and on Cymbalta.   DVT prophylaxis: SCDs Start: 01/31/24 0006   Code Status: Full code Family Communication: None at the bedside Disposition Plan: Status is: Inpatient Remains inpatient  appropriate because: Unsafe discharge planning.  Medically stable.  Needs SNF.     Consultants:  None  Procedures:  None  Antimicrobials:  Rocephin 2/9-2/11 Keflex 2/11---     Objective: Vitals:   01/31/24 2014 02/01/24 0002 02/01/24 0611 02/01/24 0833  BP: (!) 129/47 (!) 132/53 139/61 (!) 136/53  Pulse: 87 89 92 86  Resp: 19 20 18 19   Temp: 98.4 F (36.9 C) 98.8 F (37.1 C) 99.3 F (37.4 C) 98.5 F (36.9 C)  TempSrc:      SpO2: 97% 95% 94% 93%  Weight:   (!) 154.3 kg   Height:        Intake/Output Summary (Last 24 hours) at 02/01/2024 1007 Last data filed at 02/01/2024 8469 Gross per 24 hour  Intake 558 ml  Output 1000 ml  Net -442 ml   Filed Weights   01/30/24 1536 01/31/24 1712 02/01/24 0611  Weight: (!) 153.1 kg (!) 155.3 kg (!) 154.3 kg    Examination:  General exam: Appears calm and comfortable  Morbidly obese.  Chronically sick looking. Respiratory system: Clear to auscultation. Respiratory effort normal. Cardiovascular system: S1 & S2 heard, RRR. No JVD, murmurs, rubs, gallops or clicks.  Gastrointestinal system: Soft.  Nontender.  Obese and pendulous. Central nervous system: Alert and oriented. No focal neurological deficits.  Gross generalized weakness. Extremities: Symmetric 5 x 5 power.  Grossly weak. Skin:  Edematous extremities.  Red shiny skin mostly chronic lymphedematous changes.    Data Reviewed: I have personally reviewed following labs and imaging studies  CBC: Recent Labs  Lab 01/30/24 1605 01/31/24 0500  WBC 9.8 6.5  NEUTROABS  --  3.5  HGB 10.8* 9.7*  HCT 33.8* 30.3*  MCV 92.9 91.5  PLT 244 203   Basic Metabolic Panel: Recent Labs  Lab 01/29/24 1605 01/30/24 1605 01/31/24 0500  NA  --  136 140  K  --  4.8 3.4*  CL  --  99 101  CO2  --  25 25  GLUCOSE  --  80 83  BUN  --  17 12  CREATININE  --  1.24* 1.03*  CALCIUM  --  9.1 9.2  MG 2.0  --  1.8   GFR: Estimated Creatinine Clearance: 79 mL/min (A) (by C-G  formula based on SCr of 1.03 mg/dL (H)). Liver Function Tests: Recent Labs  Lab 01/31/24 0500  AST 18  ALT 13  ALKPHOS 61  BILITOT 1.0  PROT 5.8*  ALBUMIN 2.8*   No results for input(s): "LIPASE", "AMYLASE" in the last 168 hours. No results for input(s): "AMMONIA" in the last 168 hours. Coagulation Profile: No results for input(s): "INR", "PROTIME" in the last 168 hours. Cardiac Enzymes: Recent Labs  Lab 01/31/24 0504  CKTOTAL 79   BNP (last 3 results) No results for input(s): "PROBNP" in the last 8760 hours. HbA1C: Recent Labs    01/30/24 1605  HGBA1C 5.1   CBG: Recent Labs  Lab 01/31/24 0735 01/31/24 1146 01/31/24 1657 01/31/24 2013 02/01/24 0831  GLUCAP 80 91 104* 123* 142*   Lipid Profile: No results for input(s): "CHOL", "HDL", "LDLCALC", "TRIG", "CHOLHDL", "LDLDIRECT" in the last 72 hours. Thyroid Function Tests: Recent Labs    01/31/24 0504  TSH 1.943   Anemia Panel: Recent Labs    01/31/24 1801  VITAMINB12 450   Sepsis Labs: No results for input(s): "PROCALCITON", "LATICACIDVEN" in the last 168 hours.  Recent Results (from the past 240 hours)  Resp panel by RT-PCR (RSV, Flu A&B, Covid) Anterior Nasal Swab     Status: None   Collection Time: 01/30/24  6:44 PM   Specimen: Anterior Nasal Swab  Result Value Ref Range Status   SARS Coronavirus 2 by RT PCR NEGATIVE NEGATIVE Final   Influenza A by PCR NEGATIVE NEGATIVE Final   Influenza B by PCR NEGATIVE NEGATIVE Final    Comment: (NOTE) The Xpert Xpress SARS-CoV-2/FLU/RSV plus assay is intended as an aid in the diagnosis of influenza from Nasopharyngeal swab specimens and should not be used as a sole basis for treatment. Nasal washings and aspirates are unacceptable for Xpert Xpress SARS-CoV-2/FLU/RSV testing.  Fact Sheet for Patients: BloggerCourse.com  Fact Sheet for Healthcare Providers: SeriousBroker.it  This test is not yet approved  or cleared by the [place] FDA and has been authorized for detection and/or diagnosis of SARS-CoV-2 by FDA under an Emergency Use Authorization (EUA). This EUA will remain in effect (meaning this test can be used) for the duration of the COVID-19 declaration under Section 564(b)(1) of the Act, 21 U.S.C. section 360bbb-3(b)(1), unless the authorization is terminated or revoked.     Resp Syncytial Virus by PCR NEGATIVE NEGATIVE Final    Comment: (NOTE) Fact Sheet for Patients: BloggerCourse.com  Fact Sheet for Healthcare Providers: SeriousBroker.it  This test is not yet approved or cleared by the [place] FDA and has been authorized for detection and/or diagnosis of SARS-CoV-2 by FDA under an Emergency Use Authorization (EUA). This EUA will remain in effect (meaning this test can be used) for the duration of the COVID-19 declaration under Section 564(b)(1) of the Act, 21 U.S.C. section 360bbb-3(b)(1), unless the authorization is terminated  or revoked.  Performed at [facility], [address], [place], [place] [address]   Urine Culture     Status: Abnormal   Collection Time: 01/30/24  9:54 PM   Specimen: Urine, Clean Catch  Result Value Ref Range Status   Specimen Description URINE, CLEAN CATCH  Final   Special Requests   Final    added 2334 Performed at [facility], [address], [place], [place] [address]    Culture MULTIPLE SPECIES PRESENT, SUGGEST RECOLLECTION (A)  Final   Report Status 01/31/2024 FINAL  Final         Radiology Studies: DG Chest 1 View Result Date: 01/31/2024 CLINICAL DATA:  68 year old female with history of generalized weakness. EXAM: CHEST  1 VIEW COMPARISON:  Chest x-ray 12/31/2023. FINDINGS: Lung volumes are normal. No consolidative airspace disease. No pleural effusions. Cephalization of the pulmonary vasculature, with diffuse increased interstitial prominence and  peribronchial cuffing. No pneumothorax. Heart size is mildly enlarged. The patient is rotated to the left on today's exam, resulting in distortion of the mediastinal contours and reduced diagnostic sensitivity and specificity for mediastinal pathology. Atherosclerotic calcifications are noted in the thoracic aorta. Orthopedic fixation hardware in the lower cervical spine. IMPRESSION: 1. The appearance of the chest is most suggestive of mild congestive heart failure, as above. 2. Aortic atherosclerosis. Electronically Signed   By: Trudie Reed M.D.   On: 01/31/2024 06:56   DG Hip Unilat W or Wo Pelvis 2-3 Views Left Result Date: 01/30/2024 CLINICAL DATA:  Larey Seat, lower back pain EXAM: DG HIP (WITH OR WITHOUT PELVIS) 2-3V LEFT; DG HIP (WITH OR WITHOUT PELVIS) 2-3V RIGHT COMPARISON:  12/30/2023 x-ray and CT low FINDINGS: Frontal view of the pelvis as well as frontal and frogleg lateral views of the bilateral hips are obtained. No fracture, subluxation, or dislocation. Joint spaces of the bilateral hips are well preserved. Sacroiliac joints are normal. IMPRESSION: 1. No evidence of acute pelvic or hip fracture. Electronically Signed   By: Sharlet Salina M.D.   On: 01/30/2024 22:50   DG Hip Unilat W or Wo Pelvis 2-3 Views Right Result Date: 01/30/2024 CLINICAL DATA:  Larey Seat, lower back pain EXAM: DG HIP (WITH OR WITHOUT PELVIS) 2-3V LEFT; DG HIP (WITH OR WITHOUT PELVIS) 2-3V RIGHT COMPARISON:  12/30/2023 x-ray and CT low FINDINGS: Frontal view of the pelvis as well as frontal and frogleg lateral views of the bilateral hips are obtained. No fracture, subluxation, or dislocation. Joint spaces of the bilateral hips are well preserved. Sacroiliac joints are normal. IMPRESSION: 1. No evidence of acute pelvic or hip fracture. Electronically Signed   By: Sharlet Salina M.D.   On: 01/30/2024 22:50   DG Lumbar Spine Complete Result Date: 01/30/2024 CLINICAL DATA:  Larey Seat, lower back pain EXAM: LUMBAR SPINE - COMPLETE 4+ VIEW  COMPARISON:  07/28/2023, 12/30/2023 FINDINGS: Frontal, bilateral oblique, lateral views of the lumbar spine are obtained. There are 5 non-rib-bearing lumbar type vertebral bodies, with sacralization of the L5 level. Alignment is anatomic. No acute displaced fractures. Prominent facet hypertrophy from L2-3 through L5-S1. Sacroiliac joints are unremarkable. IMPRESSION: 1. No acute displaced fracture. Electronically Signed   By: Sharlet Salina M.D.   On: 01/30/2024 22:36        Scheduled Meds:  cephALEXin  500 mg Oral Q8H   famotidine  40 mg Oral Daily   fluticasone  1 spray Each Nare Daily   Gerhardt's butt cream   Topical BID   insulin aspart  0-6 Units Subcutaneous TID WC   leptospermum manuka honey  1 Application Topical Daily   pantoprazole  40 mg Oral Daily   potassium chloride  40 mEq Oral BID   torsemide  40 mg Oral Daily   Continuous Infusions:   LOS: 1 day    Time spent: 35 minutes    Dorcas Carrow, MD [facility]

## 2024-02-01 NOTE — TOC Progression Note (Signed)
Transition of Care [facility]) - Progression Note    Patient Details  Name: Maria [name] MRN: [id number] Date of Birth: [birth year]  Transition of Care [facility]) CM/SW Contact  Erin Sons, [place] Phone Number: 02/01/2024, 2:42 PM  Clinical Narrative:      CSW met with pt bedside to discuss SNF recommendation. Pt states she was recently at [facility] and would like to return there. CSW explained insurance auth process. Fl2 completed and bed requests sent in hub. Pt also requesting assistance with applying for medicaid. CSW made referral to financial navigators to complete medicaid screening.   1430: CSW confirmed bed with [facility]. CSW initiated SNF auth request in online portal. Auth ID# [id number] Berkley Harvey is pending for [facility]                  Social Determinants of Health (SDOH) Interventions SDOH Screenings   Food Insecurity: No Food Insecurity (01/31/2024)  Housing: Low Risk  (01/31/2024)  Transportation Needs: No Transportation Needs (01/31/2024)  Recent Concern: Transportation Needs - Unmet Transportation Needs (11/25/2023)  Utilities: Not At Risk (01/31/2024)  Alcohol Screen: Low Risk  (12/30/2021)  Depression (PHQ2-9): Medium Risk (01/30/2022)  Financial Resource Strain: Medium Risk (12/30/2021)  Physical Activity: Inactive (12/30/2021)  Social Connections: Moderately Isolated (01/31/2024)  Stress: No Stress Concern Present (12/30/2021)  Tobacco Use: Medium Risk (01/31/2024)    Readmission Risk Interventions    11/29/2023    1:16 PM 09/06/2023    1:10 PM 07/22/2022    2:59 PM  Readmission Risk Prevention Plan  Transportation Screening Complete Complete Complete  PCP or Specialist Appt within 3-5 Days  Complete Complete  HRI or Home Care Consult  Complete Complete  Social Work Consult for Recovery Care Planning/Counseling  Complete Complete  Palliative Care Screening  Not Applicable Complete  Medication Review Oceanographer) Complete Complete Complete  PCP or  Specialist appointment within 3-5 days of discharge Complete    HRI or Home Care Consult Complete    SW Recovery Care/Counseling Consult Complete    Palliative Care Screening Not Applicable    Skilled Nursing Facility Patient Refused

## 2024-02-01 NOTE — Plan of Care (Signed)
Problem: Education: Goal: Ability to describe self-care measures that may prevent or decrease complications (Diabetes Survival Skills Education) will improve 02/01/2024 2314 by Hortencia Pilar, RN Outcome: Progressing 02/01/2024 2223 by Hortencia Pilar, RN Outcome: Progressing Goal: Individualized Educational Video(s) 02/01/2024 2314 by Hortencia Pilar, RN Outcome: Progressing 02/01/2024 2223 by Hortencia Pilar, RN Outcome: Progressing   Problem: Coping: Goal: Ability to adjust to condition or change in health will improve 02/01/2024 2314 by Hortencia Pilar, RN Outcome: Progressing 02/01/2024 2223 by Hortencia Pilar, RN Outcome: Progressing   Problem: Fluid Volume: Goal: Ability to maintain a balanced intake and output will improve 02/01/2024 2314 by Hortencia Pilar, RN Outcome: Progressing 02/01/2024 2223 by Hortencia Pilar, RN Outcome: Progressing   Problem: Health Behavior/Discharge Planning: Goal: Ability to identify and utilize available resources and services will improve 02/01/2024 2314 by Hortencia Pilar, RN Outcome: Progressing 02/01/2024 2223 by Hortencia Pilar, RN Outcome: Progressing Goal: Ability to manage health-related needs will improve 02/01/2024 2314 by Hortencia Pilar, RN Outcome: Progressing 02/01/2024 2223 by Hortencia Pilar, RN Outcome: Progressing   Problem: Metabolic: Goal: Ability to maintain appropriate glucose levels will improve 02/01/2024 2314 by Hortencia Pilar, RN Outcome: Progressing 02/01/2024 2223 by Hortencia Pilar, RN Outcome: Progressing   Problem: Nutritional: Goal: Maintenance of adequate nutrition will improve 02/01/2024 2314 by Hortencia Pilar, RN Outcome: Progressing 02/01/2024 2223 by Hortencia Pilar, RN Outcome: Progressing Goal: Progress toward achieving an optimal weight will improve 02/01/2024 2314 by Hortencia Pilar, RN Outcome: Progressing 02/01/2024 2223 by Hortencia Pilar, RN Outcome: Progressing   Problem:  Skin Integrity: Goal: Risk for impaired skin integrity will decrease 02/01/2024 2314 by Hortencia Pilar, RN Outcome: Progressing 02/01/2024 2223 by Hortencia Pilar, RN Outcome: Progressing   Problem: Tissue Perfusion: Goal: Adequacy of tissue perfusion will improve 02/01/2024 2314 by Hortencia Pilar, RN Outcome: Progressing 02/01/2024 2223 by Hortencia Pilar, RN Outcome: Progressing   Problem: Education: Goal: Knowledge of General Education information will improve Description: Including pain rating scale, medication(s)/side effects and non-pharmacologic comfort measures 02/01/2024 2314 by Hortencia Pilar, RN Outcome: Progressing 02/01/2024 2223 by Hortencia Pilar, RN Outcome: Progressing   Problem: Health Behavior/Discharge Planning: Goal: Ability to manage health-related needs will improve 02/01/2024 2314 by Hortencia Pilar, RN Outcome: Progressing 02/01/2024 2223 by Hortencia Pilar, RN Outcome: Progressing   Problem: Clinical Measurements: Goal: Ability to maintain clinical measurements within normal limits will improve 02/01/2024 2314 by Hortencia Pilar, RN Outcome: Progressing 02/01/2024 2223 by Hortencia Pilar, RN Outcome: Progressing Goal: Will remain free from infection 02/01/2024 2314 by Hortencia Pilar, RN Outcome: Progressing 02/01/2024 2223 by Hortencia Pilar, RN Outcome: Progressing Goal: Diagnostic test results will improve 02/01/2024 2314 by Hortencia Pilar, RN Outcome: Progressing 02/01/2024 2223 by Hortencia Pilar, RN Outcome: Progressing Goal: Respiratory complications will improve 02/01/2024 2314 by Hortencia Pilar, RN Outcome: Progressing 02/01/2024 2223 by Hortencia Pilar, RN Outcome: Progressing Goal: Cardiovascular complication will be avoided 02/01/2024 2314 by Hortencia Pilar, RN Outcome: Progressing 02/01/2024 2223 by Hortencia Pilar, RN Outcome: Progressing   Problem: Activity: Goal: Risk for activity intolerance will  decrease 02/01/2024 2314 by Hortencia Pilar, RN Outcome: Progressing 02/01/2024 2223 by Hortencia Pilar, RN Outcome: Progressing   Problem: Nutrition: Goal: Adequate nutrition will be maintained 02/01/2024 2314 by Hortencia Pilar, RN Outcome: Progressing 02/01/2024 2223 by Hortencia Pilar, RN Outcome: Progressing  Problem: Coping: Goal: Level of anxiety will decrease 02/01/2024 2314 by Hortencia Pilar, RN Outcome: Progressing 02/01/2024 2223 by Hortencia Pilar, RN Outcome: Progressing   Problem: Elimination: Goal: Will not experience complications related to bowel motility 02/01/2024 2314 by Hortencia Pilar, RN Outcome: Progressing 02/01/2024 2223 by Hortencia Pilar, RN Outcome: Progressing Goal: Will not experience complications related to urinary retention 02/01/2024 2314 by Hortencia Pilar, RN Outcome: Progressing 02/01/2024 2223 by Hortencia Pilar, RN Outcome: Progressing   Problem: Pain Managment: Goal: General experience of comfort will improve and/or be controlled 02/01/2024 2314 by Hortencia Pilar, RN Outcome: Progressing 02/01/2024 2223 by Hortencia Pilar, RN Outcome: Progressing   Problem: Safety: Goal: Ability to remain free from injury will improve 02/01/2024 2314 by Hortencia Pilar, RN Outcome: Progressing 02/01/2024 2223 by Hortencia Pilar, RN Outcome: Progressing   Problem: Skin Integrity: Goal: Risk for impaired skin integrity will decrease 02/01/2024 2314 by Hortencia Pilar, RN Outcome: Progressing 02/01/2024 2223 by Hortencia Pilar, RN Outcome: Progressing

## 2024-02-01 NOTE — Evaluation (Signed)
Occupational Therapy Evaluation Patient Details Name: Maria [name] MRN: [id number] DOB: [birth year] Today's Date: 02/01/2024   History of Present Illness   History of Present Illness: Pt is a 68 y/o F presenting to ED on 2/9 with weakness, fell onto floor from sofa at home and was unable to get up. Also found to have UTI and BLE weakness. PMH includes back pain, DMII, chronic diastolic heart failure, essential pretension, anemia of heart disease with associated baseline hgb 10-12.     Clinical Impressions Pt reports using RW at baseline with mobility and ind with ADLs, uses insurance transportation and [employer]acart for meals. Pt emotional during session regarding current level of function and difficulty with mobility, but is motivated to get stronger. Pt currently needing set up- max A for ADLs, min A for bed mobility and min-mod A for transfers with RW. Pt with 2/4 DOE with transfer, SpO2 94% on RA once seated in chair. Pt presenting with impairments listed below, will follow acutely. Patient will benefit from continued inpatient follow up therapy, <3 hours/day to maximize safety/ind with ADL/functional mobility.      If plan is discharge home, recommend the following:   A lot of help with walking and/or transfers;A lot of help with bathing/dressing/bathroom;Assistance with cooking/housework;Direct supervision/assist for financial management;Direct supervision/assist for medications management;Assist for transportation;Help with stairs or ramp for entrance     Functional Status Assessment   Patient has had a recent decline in their functional status and demonstrates the ability to make significant improvements in function in a reasonable and predictable amount of time.     Equipment Recommendations   None recommended by OT     Recommendations for Other Services   PT consult     Precautions/Restrictions   Precautions Precautions: Fall Precaution/Restrictions Comments: B LE  edema/cellulitis? Restrictions Weight Bearing Restrictions Per Provider Order: No     Mobility Bed Mobility Overal bed mobility: Needs Assistance Bed Mobility: Supine to Sit     Supine to sit: Min assist     General bed mobility comments: min A to assist RLE to EOB, pt used rails    Transfers Overall transfer level: Needs assistance Equipment used: Rolling walker (2 wheels) Transfers: Sit to/from Stand, Bed to chair/wheelchair/BSC Sit to Stand: Mod assist     Step pivot transfers: Min assist     General transfer comment: min A with RW stepping to chair on L side, cue to step RLE      Balance Overall balance assessment: Needs assistance, History of Falls Sitting-balance support: Bilateral upper extremity supported, Feet unsupported Sitting balance-Leahy Scale: Good     Standing balance support: During functional activity, Reliant on assistive device for balance Standing balance-Leahy Scale: Poor Standing balance comment: reliant on RW support                           ADL either performed or assessed with clinical judgement   ADL Overall ADL's : Needs assistance/impaired Eating/Feeding: Set up;Sitting   Grooming: Set up;Sitting   Upper Body Bathing: Moderate assistance;Sitting   Lower Body Bathing: Maximal assistance;Bed level   Upper Body Dressing : Moderate assistance;Sitting   Lower Body Dressing: Maximal assistance;Bed level   Toilet Transfer: Moderate assistance;Stand-pivot;Rolling walker (2 wheels);BSC/3in1   Toileting- Clothing Manipulation and Hygiene: Maximal assistance       Functional mobility during ADLs: Moderate assistance;Rolling walker (2 wheels)       Vision Baseline Vision/History: 1 Wears glasses Vision Assessment?:  No apparent visual deficits     Perception Perception: Not tested       Praxis Praxis: Not tested       Pertinent Vitals/Pain Pain Assessment Pain Assessment: Faces Pain Score: 4  Faces Pain  Scale: Hurts little more Pain Location: B LEs Pain Descriptors / Indicators: Discomfort Pain Intervention(s): Monitored during session, Limited activity within patient's tolerance, Repositioned     Extremity/Trunk Assessment Upper Extremity Assessment Upper Extremity Assessment: Generalized weakness   Lower Extremity Assessment Lower Extremity Assessment: Defer to PT evaluation   Cervical / Trunk Assessment Cervical / Trunk Assessment: Other exceptions Cervical / Trunk Exceptions: body habitus   Communication Communication Communication: No apparent difficulties   Cognition Arousal: Alert Behavior During Therapy: WFL for tasks assessed/performed, Lability, Anxious Cognition: No family/caregiver present to determine baseline             OT - Cognition Comments: aware of why she is at the hospital, tearful regarding decr mobility and difficulty caring for self, though appears motivated to improve                 Following commands: Intact       Cueing  General Comments          Exercises     Shoulder Instructions      Home Living Family/patient expects to be discharged to:: Private residence Living Arrangements: Alone Available Help at Discharge: Other (Comment) (none) Type of Home: Apartment Home Access: Stairs to enter Entrance Stairs-Number of Steps: 6 Entrance Stairs-Rails: Right Home Layout: One level     Bathroom Shower/Tub: Chief Strategy Officer: Standard Bathroom Accessibility: No   Home Equipment: Hospital bed;Tub bench;Rolling Walker (2 wheels);Rollator (4 wheels);BSC/3in1;Wheelchair - manual   Additional Comments: bedside commode frame placed over toilet, received a wide Wheelchair recently that does not fit through doors.      Prior Functioning/Environment Prior Level of Function : History of Falls (last six months);Independent/Modified Independent             Mobility Comments: Uses a RW in home;Rollator when she  goes out; cannot fit RW through bathroom door. holds onto wall, sink ADLs Comments: BSC over toilet. alternate sponge bathing and recently getting in shower since now has a tub bench. Orders groceries, prescriptions, insurance provides transport to MD when needed    OT Problem List: Decreased strength;Decreased range of motion;Decreased activity tolerance;Impaired balance (sitting and/or standing);Decreased coordination;Decreased safety awareness;Cardiopulmonary status limiting activity;Obesity   OT Treatment/Interventions: Self-care/ADL training;Therapeutic exercise;Energy conservation;DME and/or AE instruction;Therapeutic activities;Patient/family education;Balance training      OT Goals(Current goals can be found in the care plan section)   Acute Rehab OT Goals Patient Stated Goal: to walk better OT Goal Formulation: With patient Time For Goal Achievement: 02/15/24 Potential to Achieve Goals: Good ADL Goals Pt Will Perform Upper Body Dressing: with supervision;sitting Pt Will Perform Lower Body Dressing: with contact guard assist;sitting/lateral leans;sit to/from stand;bed level;with adaptive equipment Pt Will Transfer to Toilet: with contact guard assist;ambulating;regular height toilet Pt Will Perform Tub/Shower Transfer: Tub transfer;Shower transfer;tub bench;ambulating;rolling walker   OT Frequency:  Min 1X/week    Co-evaluation              AM-PAC OT "6 Clicks" Daily Activity     Outcome Measure Help from another person eating meals?: A Little Help from another person taking care of personal grooming?: A Little Help from another person toileting, which includes using toliet, bedpan, or urinal?: A Lot Help from another  person bathing (including washing, rinsing, drying)?: A Lot Help from another person to put on and taking off regular upper body clothing?: A Little Help from another person to put on and taking off regular lower body clothing?: A Lot 6 Click Score: 15    End of Session Equipment Utilized During Treatment: Gait belt;Rolling walker (2 wheels) Nurse Communication: Mobility status  Activity Tolerance: Patient tolerated treatment well Patient left: in chair;with call bell/phone within reach;with chair alarm set  OT Visit Diagnosis: Unsteadiness on feet (R26.81);Other abnormalities of gait and mobility (R26.89);Muscle weakness (generalized) (M62.81)                Time: 2952-8413 OT Time Calculation (min): 33 min Charges:  OT General Charges $OT Visit: 1 Visit OT Evaluation $OT Eval Moderate Complexity: 1 Mod OT Treatments $Self Care/Home Management : 8-22 mins  Carver Fila, OTD, OTR/L SecureChat Preferred Acute Rehab [phone]   Carver Fila Koonce 02/01/2024, 9:24 AM

## 2024-02-01 NOTE — [place] FL2 (Signed)
[place] MEDICAID FL2 LEVEL OF CARE FORM     IDENTIFICATION  Patient Name: Maria [name] Birthdate: 04/26/1956 Sex: female Admission Date (Current Location): 01/30/2024  [facility] and [place] Number:  Producer, television/film/video and Address:  [facility], [address], [place], [place] [address]      Provider Number: [id number]  Attending Physician Name and Address:  Dorcas Carrow, MD  Relative Name and Phone Number:  [address]   [place] [address]    Current Level of Care: Hospital Recommended Level of Care: Skilled Nursing Facility Prior Approval Number: [id number]  Date Approved/Denied:   PASRR Number: [id number]  Discharge Plan: SNF    Current Diagnoses: Patient Active Problem List   Diagnosis Date Noted   Generalized weakness 01/31/2024   Hypoglycemia 01/31/2024   Anemia of chronic disease 01/31/2024   Pressure injury of skin 11/26/2023   Hallucinations 09/02/2023   Stasis dermatitis of both legs 08/30/2023   Hypomagnesemia 08/02/2023   Ankle pain, right 08/01/2023   Weakness of both lower extremities 07/30/2023   Morbid obesity (HCC) 07/30/2023   (HFpEF) heart failure with preserved ejection fraction (HCC) 07/15/2023   Abdominal pain 07/15/2023   Acute renal failure superimposed on stage 3a chronic kidney disease (HCC) 07/15/2023   Acute on chronic respiratory failure with hypoxia and hypercapnia (HCC) 02/08/2023   Pneumonia due to infectious organism 02/05/2023   Chronic right-sided congestive heart failure (HCC) 02/04/2023   Fall 02/02/2023   Physical deconditioning 07/22/2022   Fluid overload 07/21/2022   Acute cystitis    Dyspnea 12/19/2021   Leg swelling 11/20/2021   Bilateral knee pain 10/29/2021   Hypernatremia 09/24/2021   Cellulitis of lower extremity 09/24/2021   CKD (chronic kidney disease), stage IIIa (HCC) 09/24/2021   Sacral decubitus ulcer, stage II (HCC) 09/20/2021   Acute on chronic heart  failure with preserved ejection fraction (HFpEF) (HCC) 09/19/2021   Nail disorder 07/07/2021   Pre-ulcerative corn or callous 07/07/2021   Rash 07/07/2021   Chronic diastolic CHF (congestive heart failure) (HCC) 03/17/2021   DM2 (diabetes mellitus, type 2) (HCC) 03/11/2021   Vitamin D deficiency 01/01/2021   Medial epicondylitis 12/31/2020   Aortic atherosclerosis (HCC) 09/26/2020   Umbilical hernia 09/26/2020   Constipation 08/05/2020   Arthritis 06/12/2020   Hoarseness 05/01/2020   B12 deficiency 01/31/2020   Rosacea 01/31/2020   Neck swelling 01/31/2020   History of colonic polyps    Benign neoplasm of [place]    Nausea 08/31/2019   Throat pain 07/26/2019   HLD (hyperlipidemia) 05/24/2019   Leg cramps 05/24/2019   Lump in neck 05/24/2019   Left thyroid nodule 01/25/2019   Jerking movements of extremities 07/20/2018   Balance disorder 07/20/2018   Right knee pain 03/21/2018   OSA (obstructive sleep apnea) 03/21/2018   Oxygen desaturation 03/21/2018   Urinary symptom or sign 03/21/2018   Lymphedema 01/25/2018   Gait disorder 01/25/2018   Dysphonia 12/07/2017   Encounter for well adult exam with abnormal findings 11/08/2017   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   Brokaw polyp 08/11/2016   Abdominal pain, epigastric    Dysphagia    [place] cancer screening    ACE-inhibitor cough 12/05/2015   Restless legs syndrome 10/08/2015   Venous stasis dermatitis of both lower extremities 04/26/2015   GERD without esophagitis 10/17/2014   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/17/2014   AR (allergic rhinitis) 10/17/2014   Fatty liver 10/17/2014   Chronic pain syndrome 09/19/2014  Post-traumatic osteoarthritis of both knees 09/19/2014   Spinal stenosis 09/19/2014   Depression, major, single episode, complete remission (HCC) 09/19/2014   Narcotic dependence (HCC) 09/19/2014   Acute hypoxic respiratory failure (HCC) 08/20/2014   False positive HIV serology 06/02/2013   Hypokalemia  06/29/2012   Spondylosis of 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 03/09/2012   Lumbar disc disease 03/09/2012   Morbid obesity with body mass index (BMI) of 50.0 to 59.9 in adult [facility]) 06/17/2011   Chronic low back pain 12/28/2008   GAD (generalized anxiety disorder) 12/06/2007   Depression 12/06/2007   Essential hypertension 12/06/2007   Gastroesophageal reflux disease without esophagitis 12/06/2007   LOW BACK PAIN 12/06/2007   Allergic rhinitis 08/10/2007    Orientation RESPIRATION BLADDER Height & Weight     Self, Situation, Time, Place  Normal Continent Weight: (!) 340 lb 2.7 oz (154.3 kg) Height:  5\' 3"  (160 cm)  BEHAVIORAL SYMPTOMS/MOOD NEUROLOGICAL BOWEL NUTRITION STATUS      Continent Diet (see d/c summary)  AMBULATORY STATUS COMMUNICATION OF NEEDS Skin   Extensive Assist Verbally Other (Comment) (sacrum, inner buttocks open area)                       Personal Care Assistance Level of Assistance  Bathing, Feeding, Dressing Bathing Assistance: Maximum assistance Feeding assistance: Independent Dressing Assistance: Limited assistance     Functional Limitations Info  Sight, Hearing, Speech Sight Info: Impaired Hearing Info: Adequate Speech Info: Adequate    SPECIAL CARE FACTORS FREQUENCY  OT (By licensed OT), PT (By licensed PT)     PT Frequency: 5x/week OT Frequency: 5x/week            Contractures Contractures Info: Not present    Additional Factors Info  Code Status, Allergies Code Status Info: full code Allergies Info: Amoxicillin-pot Clavulanate, adhesive tape, codeine, Crestor (rosuvastatin Calcium), Cyclobenzaprine, Cymbalta (duloxetine Hcl), Dilaudid (hydromorphone), gabapentin, Vicodin (hydrocodone-acetaminophen, morphine, Vicodin (hydrocodone-acetaminophen, latex           Current Medications (02/01/2024):  This is the current hospital active medication list Current Facility-Administered Medications  Medication Dose Route  Frequency Provider Last Rate Last Admin   acetaminophen (TYLENOL) tablet 650 mg  650 mg Oral Q6H PRN Howerter, Justin B, DO       Or   acetaminophen (TYLENOL) suppository 650 mg  650 mg Rectal Q6H PRN Howerter, Justin B, DO       cephALEXin (KEFLEX) capsule 500 mg  500 mg Oral Q8H Ghimire, Lyndel Safe, MD       clonazePAM (KLONOPIN) tablet 1 mg  1 mg Oral BID PRN Howerter, Justin B, DO   1 mg at 01/31/24 1722   dextrose 50 % solution 50 mL  1 ampule Intravenous Q1H PRN Howerter, Justin B, DO       famotidine (PEPCID) tablet 40 mg  40 mg Oral Daily Howerter, Justin B, DO   40 mg at 02/01/24 0921   fluticasone (FLONASE) 50 MCG/ACT nasal spray 1 spray  1 spray Each Nare Daily Howerter, Justin B, DO   1 spray at 02/01/24 5621   Gerhardt's butt cream   Topical BID Dorcas Carrow, MD       Gerhardt's butt cream   Topical PRN Reome, Earle J, RPH       insulin aspart (novoLOG) injection 0-6 Units  0-6 Units Subcutaneous TID WC Howerter, Justin B, DO       leptospermum manuka honey (MEDIHONEY) paste 1 Application  1 Application Topical  Daily Dorcas Carrow, MD       melatonin tablet 3 mg  3 mg Oral QHS PRN Howerter, Justin B, DO       naloxone (NARCAN) injection 0.4 mg  0.4 mg Intravenous PRN Howerter, Justin B, DO       ondansetron (ZOFRAN) injection 4 mg  4 mg Intravenous Q6H PRN Howerter, Justin B, DO       oxyCODONE (Oxy IR/ROXICODONE) immediate release tablet 10 mg  10 mg Oral Q6H PRN Dorcas Carrow, MD   10 mg at 02/01/24 0631   pantoprazole (PROTONIX) EC tablet 40 mg  40 mg Oral Daily Howerter, Justin B, DO   40 mg at 02/01/24 1610   potassium chloride SA (KLOR-CON M) CR tablet 40 mEq  40 mEq Oral BID Dorcas Carrow, MD       torsemide (DEMADEX) tablet 40 mg  40 mg Oral Daily Dorcas Carrow, MD         Discharge Medications: Please see discharge summary for a list of discharge medications.  Relevant Imaging Results:  Relevant Lab Results:   Additional Information RUE:[id number]  Erin Sons, LCSW

## 2024-02-01 NOTE — Plan of Care (Signed)

## 2024-02-02 DIAGNOSIS — N3000 Acute cystitis without hematuria: Secondary | ICD-10-CM | POA: Diagnosis not present

## 2024-02-02 DIAGNOSIS — E162 Hypoglycemia, unspecified: Secondary | ICD-10-CM | POA: Diagnosis not present

## 2024-02-02 DIAGNOSIS — R531 Weakness: Secondary | ICD-10-CM | POA: Diagnosis not present

## 2024-02-02 DIAGNOSIS — J301 Allergic rhinitis due to pollen: Secondary | ICD-10-CM | POA: Diagnosis not present

## 2024-02-02 LAB — GLUCOSE, CAPILLARY
Glucose-Capillary: 121 mg/dL — ABNORMAL HIGH (ref 70–99)
Glucose-Capillary: 128 mg/dL — ABNORMAL HIGH (ref 70–99)
Glucose-Capillary: 126 mg/dL — ABNORMAL HIGH (ref 70–99)
Glucose-Capillary: 66 mg/dL — ABNORMAL LOW (ref 70–99)
Glucose-Capillary: 111 mg/dL — ABNORMAL HIGH (ref 70–99)

## 2024-02-02 MED ORDER — CLONAZEPAM 1 MG PO TABS: 1.0000 mg | ORAL_TABLET | Freq: Two times a day (BID) | ORAL | 0 refills | Status: DC | PRN

## 2024-02-02 MED ORDER — OXYCODONE HCL 10 MG PO TABS
10.0000 mg | ORAL_TABLET | Freq: Four times a day (QID) | ORAL | 0 refills | Status: AC | PRN
Start: 2024-02-02 — End: 2024-02-07

## 2024-02-02 MED ORDER — CEPHALEXIN 500 MG PO CAPS: 500.0000 mg | ORAL_CAPSULE | Freq: Three times a day (TID) | ORAL | 0 refills | Status: AC

## 2024-02-02 NOTE — Discharge Summary (Signed)
Physician Discharge Summary  Maria [name] ZOX:[id number] DOB: [birth year] DOA: 01/30/2024  PCP: Nona Dell, NP  Admit date: 01/30/2024 Discharge date: 02/02/2024  Admitted From: Home Disposition: Skilled nursing facility  Recommendations for Outpatient Follow-up:  Follow up with PCP in 1-2 weeks Please obtain BMP/CBC in one week  Discharge Condition: Stable CODE STATUS: Full code Diet recommendation: Low-salt and low-carb diet  Discharge summary: 68 year old with history of chronic low back pain, type 2 diabetes on Mounjaro, chronic diastolic heart failure, essential hypertension, chronic debility who was not able to move around for the last 2 days so came to the ER. She was found to have UTI and worsening lower extremity weakness along with previous history of chronic low back pain and impaired mobility.  Stable to go to SNF today.   Assessment & Plan:   Acute on chronic weakness, progressive debility: Patient with progressive debility, chronic low back pain and deconditioning.  No focal neurological deficits.  Electrolytes adequate. B12, TSH and CPK within normal limits. Continue to aggressively work with therapies.  Refer to SNF for rehab.  Patient may also need more assistance at home or placement into assisted living facility in the near future. Patient also on pain management and uses oxycodone for pain relief.   Acute UTI present on admission: Patient was symptomatic.  Currently improved.  Received Rocephin x 2.  Urine culture with mixed flora.  Will prescribe oral Keflex on discharge.     Chronic medical issues including Type 2 diabetes on Mounjaro, known A1c less than 6.  Encourage oral intake.  Continue weekly Mounjaro.   Chronic diastolic heart failure: Currently euvolemic.  Resume home dose of torsemide, Aldactone and potassium supplements.  Essential hypertension, blood pressure is stable as above on metoprolol, torsemide and Aldactone.  Generalized anxiety  disorder, scheduled clonazepam and on Cymbalta.  Patient also uses pramipexole at night.  Will continue.  Stable for discharge.  Discharge Diagnoses:  Principal Problem:   Generalized weakness Active Problems:   GAD (generalized anxiety disorder)   Essential hypertension   Allergic rhinitis   Chronic low back pain   DM2 (diabetes mellitus, type 2) (HCC)   Acute cystitis   Hypoglycemia   Anemia of chronic disease    Discharge Instructions  Discharge Instructions     Diet - low sodium heart healthy   Complete by: As directed    Diet Carb Modified   Complete by: As directed    Discharge wound care:   Complete by: As directed    Wound care  Daily      Comments: Cleanse coccyx wound with NS, apply Medihoney to wound bed daily, cover with dry gauze. Coat surrounding skin with Gerhardt's butt cream.  Secure dressing with silicone foam or ABD pad whichever is preferred  Wound care  Every shift      Comments: Cleanse underneath breasts and pannus with Vashe wound cleanser Hart [place] [phone]), apply Tommy Rainwater [facility] [place] [phone]) to area   Increase activity slowly   Complete by: As directed       Allergies as of 02/02/2024       Reactions   Amoxicillin-pot Clavulanate Nausea And Vomiting   Projectile vomiting   Adhesive [tape] Other (See Comments)   Tears skin off - use paper tape    Codeine Other (See Comments)   hallucinations   Crestor [rosuvastatin Calcium] Other (See Comments)   Severe muscle cramps   Cyclobenzaprine Other (See Comments)   Hallucinations    Cymbalta [duloxetine  Hcl] Other (See Comments)   Hallucinations    Dilaudid [hydromorphone] Other (See Comments)   Headache and Dizziness   Gabapentin Other (See Comments)   Trembling   Lyrica [pregabalin] Other (See Comments)   weakness   Morphine And Codeine Nausea And Vomiting, Other (See Comments)   Migraine headaches   Vicodin [hydrocodone-acetaminophen] Other (See Comments)   hallucinations   Latex Rash         Medication List     TAKE these medications    acetaminophen 500 MG tablet Commonly known as: TYLENOL Take 1,000 mg by mouth every 8 (eight) hours as needed for moderate pain (pain score 4-6) or headache.   albuterol 108 (90 Base) MCG/ACT inhaler Commonly known as: VENTOLIN HFA Inhale 1 puff into the lungs every 6 (six) hours as needed for wheezing or shortness of breath.   cephALEXin 500 MG capsule Commonly known as: KEFLEX Take 1 capsule (500 mg total) by mouth every 8 (eight) hours for 3 days.   cholecalciferol 25 MCG (1000 UNIT) tablet Commonly known as: VITAMIN D3 Take 1,000 Units by mouth daily.   clonazePAM 1 MG tablet Commonly known as: KLONOPIN Take 1 tablet (1 mg total) by mouth 2 (two) times daily as needed for up to 5 days. for anxiety What changed:  reasons to take this additional instructions   cyanocobalamin 1000 MCG tablet Commonly known as: VITAMIN B12 Take 1 tablet (1,000 mcg total) by mouth daily.   famotidine 40 MG tablet Commonly known as: PEPCID Take 40 mg by mouth at bedtime.   fluticasone 50 MCG/ACT nasal spray Commonly known as: FLONASE Place 1 spray into both nostrils daily.   Metamucil Fiber Chew Chew 3 each by mouth at bedtime.   metoprolol succinate 25 MG 24 hr tablet Commonly known as: TOPROL-XL Take 1 tablet (25 mg total) by mouth daily.   Mounjaro 15 MG/0.5ML Pen Generic drug: tirzepatide Inject 15 mg into the skin once a week. Every Monday   Narcan 4 MG/0.1ML Liqd nasal spray kit Generic drug: naloxone Place 0.4 mg into the nose daily as needed (opioid overdose).   nystatin powder Commonly known as: MYCOSTATIN/NYSTOP Apply 1 Application topically daily.   ondansetron 4 MG disintegrating tablet Commonly known as: ZOFRAN-ODT Take 4 mg by mouth every 8 (eight) hours as needed for nausea or vomiting.   Oxycodone HCl 10 MG Tabs Take 1 tablet (10 mg total) by mouth every 6 (six) hours as needed for up to 5 days  (Pain).   pantoprazole 40 MG tablet Commonly known as: PROTONIX TAKE ONE TABLET BY MOUTH DAILY   Potassium Chloride ER 20 MEQ Tbcr Take 40 mEq by mouth in the morning and at bedtime.   pramipexole 0.5 MG tablet Commonly known as: MIRAPEX Take 0.5 mg by mouth at bedtime as needed (Muscle spasms).   spironolactone 25 MG tablet Commonly known as: ALDACTONE Take 0.5 tablets (12.5 mg total) by mouth daily. What changed: how much to take   torsemide 20 MG tablet Commonly known as: DEMADEX Take 2 tablets (40 mg total) by mouth daily. What changed: how much to take               Discharge Care Instructions  (From admission, onward)           Start     Ordered   02/02/24 0000  Discharge wound care:       Comments: Wound care  Daily      Comments: Cleanse coccyx wound  with NS, apply Medihoney to wound bed daily, cover with dry gauze. Coat surrounding skin with Gerhardt's butt cream.  Secure dressing with silicone foam or ABD pad whichever is preferred  Wound care  Every shift      Comments: Cleanse underneath breasts and pannus with Vashe wound cleanser Hart [place] [phone]), apply Tommy Rainwater [facility] [place] [phone]) to area   02/02/24 1132            Allergies  Allergen Reactions   Amoxicillin-Pot Clavulanate Nausea And Vomiting    Projectile vomiting   Adhesive [Tape] Other (See Comments)    Tears skin off - use paper tape    Codeine Other (See Comments)    hallucinations   Crestor [Rosuvastatin Calcium] Other (See Comments)    Severe muscle cramps   Cyclobenzaprine Other (See Comments)    Hallucinations    Cymbalta [Duloxetine Hcl] Other (See Comments)    Hallucinations    Dilaudid [Hydromorphone] Other (See Comments)    Headache and Dizziness   Gabapentin Other (See Comments)    Trembling   Lyrica [Pregabalin] Other (See Comments)    weakness   Morphine And Codeine Nausea And Vomiting and Other (See Comments)    Migraine headaches   Vicodin  [Hydrocodone-Acetaminophen] Other (See Comments)    hallucinations   Latex Rash    Consultations: None   Procedures/Studies: DG Chest 1 View Result Date: 01/31/2024 CLINICAL DATA:  68 year old female with history of generalized weakness. EXAM: CHEST  1 VIEW COMPARISON:  Chest x-ray 12/31/2023. FINDINGS: Lung volumes are normal. No consolidative airspace disease. No pleural effusions. Cephalization of the pulmonary vasculature, with diffuse increased interstitial prominence and peribronchial cuffing. No pneumothorax. Heart size is mildly enlarged. The patient is rotated to the left on today's exam, resulting in distortion of the mediastinal contours and reduced diagnostic sensitivity and specificity for mediastinal pathology. Atherosclerotic calcifications are noted in the thoracic aorta. Orthopedic fixation hardware in the lower cervical spine. IMPRESSION: 1. The appearance of the chest is most suggestive of mild congestive heart failure, as above. 2. Aortic atherosclerosis. Electronically Signed   By: Trudie Reed M.D.   On: 01/31/2024 06:56   DG Hip Unilat W or Wo Pelvis 2-3 Views Left Result Date: 01/30/2024 CLINICAL DATA:  Larey Seat, lower back pain EXAM: DG HIP (WITH OR WITHOUT PELVIS) 2-3V LEFT; DG HIP (WITH OR WITHOUT PELVIS) 2-3V RIGHT COMPARISON:  12/30/2023 x-ray and CT low FINDINGS: Frontal view of the pelvis as well as frontal and frogleg lateral views of the bilateral hips are obtained. No fracture, subluxation, or dislocation. Joint spaces of the bilateral hips are well preserved. Sacroiliac joints are normal. IMPRESSION: 1. No evidence of acute pelvic or hip fracture. Electronically Signed   By: Sharlet Salina M.D.   On: 01/30/2024 22:50   DG Hip Unilat W or Wo Pelvis 2-3 Views Right Result Date: 01/30/2024 CLINICAL DATA:  Larey Seat, lower back pain EXAM: DG HIP (WITH OR WITHOUT PELVIS) 2-3V LEFT; DG HIP (WITH OR WITHOUT PELVIS) 2-3V RIGHT COMPARISON:  12/30/2023 x-ray and CT low FINDINGS:  Frontal view of the pelvis as well as frontal and frogleg lateral views of the bilateral hips are obtained. No fracture, subluxation, or dislocation. Joint spaces of the bilateral hips are well preserved. Sacroiliac joints are normal. IMPRESSION: 1. No evidence of acute pelvic or hip fracture. Electronically Signed   By: Sharlet Salina M.D.   On: 01/30/2024 22:50   DG Lumbar Spine Complete Result Date: 01/30/2024 CLINICAL DATA:  Larey Seat, lower back  pain EXAM: LUMBAR SPINE - COMPLETE 4+ VIEW COMPARISON:  07/28/2023, 12/30/2023 FINDINGS: Frontal, bilateral oblique, lateral views of the lumbar spine are obtained. There are 5 non-rib-bearing lumbar type vertebral bodies, with sacralization of the L5 level. Alignment is anatomic. No acute displaced fractures. Prominent facet hypertrophy from L2-3 through L5-S1. Sacroiliac joints are unremarkable. IMPRESSION: 1. No acute displaced fracture. Electronically Signed   By: Sharlet Salina M.D.   On: 01/30/2024 22:36   (Echo, Carotid, EGD, Colonoscopy, ERCP)    Subjective: Patient seen and examined.  Denies any complaints.  Agreeable to go to rehab today.   Discharge Exam: Vitals:   02/01/24 2013 02/02/24 0441  BP: (!) 134/55 (!) 138/56  Pulse: 90 83  Resp: 18 17  Temp: 99.3 F (37.4 C) 98.4 F (36.9 C)  SpO2: 98% 94%   Vitals:   02/01/24 0833 02/01/24 1635 02/01/24 2013 02/02/24 0441  BP: (!) 136/53 (!) 157/60 (!) 134/55 (!) 138/56  Pulse: 86 86 90 83  Resp: 19 19 18 17   Temp: 98.5 F (36.9 C) 98.1 F (36.7 C) 99.3 F (37.4 C) 98.4 F (36.9 C)  TempSrc: Oral Oral Oral   SpO2: 93% 99% 98% 94%  Weight:      Height:        General: Pt is alert, awake, not in acute distress Cardiovascular: RRR, S1/S2 +, no rubs, no gallops Respiratory: CTA bilaterally, no wheezing, no rhonchi Abdominal: Soft, NT, ND, bowel sounds + Extremities:  Chronic nonpitting edema.  Lymphedematous changes with some erythema of the skin.         The results of  significant diagnostics from this hospitalization (including imaging, microbiology, ancillary and laboratory) are listed below for reference.     Microbiology: Recent Results (from the past 240 hours)  Resp panel by RT-PCR (RSV, Flu A&B, Covid) Anterior Nasal Swab     Status: None   Collection Time: 01/30/24  6:44 PM   Specimen: Anterior Nasal Swab  Result Value Ref Range Status   SARS Coronavirus 2 by RT PCR NEGATIVE NEGATIVE Final   Influenza A by PCR NEGATIVE NEGATIVE Final   Influenza B by PCR NEGATIVE NEGATIVE Final    Comment: (NOTE) The Xpert Xpress SARS-CoV-2/FLU/RSV plus assay is intended as an aid in the diagnosis of influenza from Nasopharyngeal swab specimens and should not be used as a sole basis for treatment. Nasal washings and aspirates are unacceptable for Xpert Xpress SARS-CoV-2/FLU/RSV testing.  Fact Sheet for Patients: BloggerCourse.com  Fact Sheet for Healthcare Providers: SeriousBroker.it  This test is not yet approved or cleared by the [place] FDA and has been authorized for detection and/or diagnosis of SARS-CoV-2 by FDA under an Emergency Use Authorization (EUA). This EUA will remain in effect (meaning this test can be used) for the duration of the COVID-19 declaration under Section 564(b)(1) of the Act, 21 U.S.C. section 360bbb-3(b)(1), unless the authorization is terminated or revoked.     Resp Syncytial Virus by PCR NEGATIVE NEGATIVE Final    Comment: (NOTE) Fact Sheet for Patients: BloggerCourse.com  Fact Sheet for Healthcare Providers: SeriousBroker.it  This test is not yet approved or cleared by the [place] FDA and has been authorized for detection and/or diagnosis of SARS-CoV-2 by FDA under an Emergency Use Authorization (EUA). This EUA will remain in effect (meaning this test can be used) for the duration of the COVID-19  declaration under Section 564(b)(1) of the Act, 21 U.S.C. section 360bbb-3(b)(1), unless the authorization is terminated or revoked.  Performed at [facility], [address], [place], [place] [address]   Urine Culture     Status: Abnormal   Collection Time: 01/30/24  9:54 PM   Specimen: Urine, Clean Catch  Result Value Ref Range Status   Specimen Description URINE, CLEAN CATCH  Final   Special Requests   Final    added 2334 Performed at [facility], [address] [place], [place] [address]    Culture MULTIPLE SPECIES PRESENT, SUGGEST RECOLLECTION (A)  Final   Report Status 01/31/2024 FINAL  Final     Labs: BNP (last 3 results) Recent Labs    12/11/23 0226 12/28/23 0254 01/31/24 0504  BNP 25.2 37.1 59.9   Basic Metabolic Panel: Recent Labs  Lab 01/29/24 1605 01/30/24 1605 01/31/24 0500  NA  --  136 140  K  --  4.8 3.4*  CL  --  99 101  CO2  --  25 25  GLUCOSE  --  80 83  BUN  --  17 12  CREATININE  --  1.24* 1.03*  CALCIUM  --  9.1 9.2  MG 2.0  --  1.8   Liver Function Tests: Recent Labs  Lab 01/31/24 0500  AST 18  ALT 13  ALKPHOS 61  BILITOT 1.0  PROT 5.8*  ALBUMIN 2.8*   No results for input(s): "LIPASE", "AMYLASE" in the last 168 hours. No results for input(s): "AMMONIA" in the last 168 hours. CBC: Recent Labs  Lab 01/30/24 1605 01/31/24 0500  WBC 9.8 6.5  NEUTROABS  --  3.5  HGB 10.8* 9.7*  HCT 33.8* 30.3*  MCV 92.9 91.5  PLT 244 203   Cardiac Enzymes: Recent Labs  Lab 01/31/24 0504  CKTOTAL 79   BNP: Invalid input(s): "POCBNP" CBG: Recent Labs  Lab 02/01/24 1632 02/01/24 2015 02/01/24 2355 02/02/24 0436 02/02/24 0749  GLUCAP 73 138* 120* 121* 128*   D-Dimer No results for input(s): "DDIMER" in the last 72 hours. Hgb A1c Recent Labs    01/30/24 1605  HGBA1C 5.1   Lipid Profile No results for input(s): "CHOL", "HDL", "LDLCALC", "TRIG", "CHOLHDL", "LDLDIRECT" in the last 72 hours. Thyroid function  studies Recent Labs    01/31/24 0504  TSH 1.943   Anemia work up Recent Labs    01/31/24 1801  VITAMINB12 450   Urinalysis    Component Value Date/Time   COLORURINE YELLOW 01/30/2024 2154   APPEARANCEUR HAZY (A) 01/30/2024 2154   LABSPEC 1.016 01/30/2024 2154   PHURINE 5.0 01/30/2024 2154   GLUCOSEU NEGATIVE 01/30/2024 2154   GLUCOSEU NEGATIVE 01/30/2022 1342   HGBUR SMALL (A) 01/30/2024 2154   BILIRUBINUR NEGATIVE 01/30/2024 2154   KETONESUR NEGATIVE 01/30/2024 2154   PROTEINUR NEGATIVE 01/30/2024 2154   UROBILINOGEN 0.2 01/30/2022 1342   NITRITE NEGATIVE 01/30/2024 2154   LEUKOCYTESUR LARGE (A) 01/30/2024 2154   Sepsis Labs Recent Labs  Lab 01/30/24 1605 01/31/24 0500  WBC 9.8 6.5   Microbiology Recent Results (from the past 240 hours)  Resp panel by RT-PCR (RSV, Flu A&B, Covid) Anterior Nasal Swab     Status: None   Collection Time: 01/30/24  6:44 PM   Specimen: Anterior Nasal Swab  Result Value Ref Range Status   SARS Coronavirus 2 by RT PCR NEGATIVE NEGATIVE Final   Influenza A by PCR NEGATIVE NEGATIVE Final   Influenza B by PCR NEGATIVE NEGATIVE Final    Comment: (NOTE) The Xpert Xpress SARS-CoV-2/FLU/RSV plus assay is intended as an aid  in the diagnosis of influenza from Nasopharyngeal swab specimens and should not be used as a sole basis for treatment. Nasal washings and aspirates are unacceptable for Xpert Xpress SARS-CoV-2/FLU/RSV testing.  Fact Sheet for Patients: BloggerCourse.com  Fact Sheet for Healthcare Providers: SeriousBroker.it  This test is not yet approved or cleared by the [place] FDA and has been authorized for detection and/or diagnosis of SARS-CoV-2 by FDA under an Emergency Use Authorization (EUA). This EUA will remain in effect (meaning this test can be used) for the duration of the COVID-19 declaration under Section 564(b)(1) of the Act, 21 U.S.C. section 360bbb-3(b)(1),  unless the authorization is terminated or revoked.     Resp Syncytial Virus by PCR NEGATIVE NEGATIVE Final    Comment: (NOTE) Fact Sheet for Patients: BloggerCourse.com  Fact Sheet for Healthcare Providers: SeriousBroker.it  This test is not yet approved or cleared by the [place] FDA and has been authorized for detection and/or diagnosis of SARS-CoV-2 by FDA under an Emergency Use Authorization (EUA). This EUA will remain in effect (meaning this test can be used) for the duration of the COVID-19 declaration under Section 564(b)(1) of the Act, 21 U.S.C. section 360bbb-3(b)(1), unless the authorization is terminated or revoked.  Performed at [facility], [address], [place], [place] [address]   Urine Culture     Status: Abnormal   Collection Time: 01/30/24  9:54 PM   Specimen: Urine, Clean Catch  Result Value Ref Range Status   Specimen Description URINE, CLEAN CATCH  Final   Special Requests   Final    added 2334 Performed at [facility], [address], [place], [place] [address]    Culture MULTIPLE SPECIES PRESENT, SUGGEST RECOLLECTION (A)  Final   Report Status 01/31/2024 FINAL  Final     Time coordinating discharge: 35 minutes  SIGNED:   Dorcas Carrow, MD  [facility] 02/02/2024, 11:33 AM

## 2024-02-02 NOTE — Progress Notes (Signed)
Report giving to [facility] Room concerning patient discharge @ 1515

## 2024-02-02 NOTE — Progress Notes (Signed)
Physical Therapy Treatment Patient Details Name: Maria [name] MRN: [id number] DOB: 1956-02-17 Today's Date: 02/02/2024   History of Present Illness Pt is a 67 y/o F presenting to ED on 2/9 with weakness, fell onto floor from sofa at home and was unable to get up. Also found to have UTI and BLE weakness. PMH includes back pain, DMII, chronic diastolic heart failure, essential pretension, anemia of heart disease with associated baseline hgb 10-12.    PT Comments  Pt tolerates treatment well, emphasis of session placed on transfer training to improve efficiency. PT provides cues for increased trunk flexion and anterior weight shift, resulting in reduced assistance requirements. Pt continues to demonstrate poor tolerance for single leg stance during step-pivot transfer and remains at a high risk for falls. Patient will benefit from continued inpatient follow up therapy, <3 hours/day.    If plan is discharge home, recommend the following: Assist for transportation;Assistance with cooking/housework;Help with stairs or ramp for entrance;Two people to help with walking and/or transfers;A lot of help with bathing/dressing/bathroom   Can travel by private vehicle     No  Equipment Recommendations  None recommended by PT    Recommendations for Other Services       Precautions / Restrictions Precautions Precautions: Fall Recall of Precautions/Restrictions: Intact Precaution/Restrictions Comments: B LE edema Restrictions Weight Bearing Restrictions Per Provider Order: No     Mobility  Bed Mobility Overal bed mobility: Needs Assistance Bed Mobility: Supine to Sit     Supine to sit: Min assist, HOB elevated          Transfers Overall transfer level: Needs assistance Equipment used: Rolling walker (2 wheels) Transfers: Sit to/from Stand, Bed to chair/wheelchair/BSC Sit to Stand: Min assist   Step pivot transfers: Min assist       General transfer comment: pt performs 5 sit to  stands during session, emphasis on cues for increased trunk flexion and anterior weight shift to improve transfer efficiency. Short shuffling steps when pivoting from bed to recliner    Ambulation/Gait                   Stairs             Wheelchair Mobility     Tilt Bed    Modified Rankin (Stroke Patients Only)       Balance Overall balance assessment: Needs assistance Sitting-balance support: No upper extremity supported, Feet supported Sitting balance-Leahy Scale: Good     Standing balance support: Bilateral upper extremity supported, Reliant on assistive device for balance Standing balance-Leahy Scale: Poor                              Communication Communication Communication: No apparent difficulties  Cognition Arousal: Alert Behavior During Therapy: WFL for tasks assessed/performed   PT - Cognitive impairments: No apparent impairments                         Following commands: Intact      Cueing Cueing Techniques: Verbal cues  Exercises      General Comments General comments (skin integrity, edema, etc.): VSS on RA      Pertinent Vitals/Pain Pain Assessment Pain Assessment: No/denies pain    Home Living                          Prior Function  PT Goals (current goals can now be found in the care plan section) Acute Rehab PT Goals Patient Stated Goal: to get better, wants to go to ALF Progress towards PT goals: Progressing toward goals    Frequency    Min 1X/week      PT Plan      Co-evaluation              AM-PAC PT "6 Clicks" Mobility   Outcome Measure  Help needed turning from your back to your side while in a flat bed without using bedrails?: A Little Help needed moving from lying on your back to sitting on the side of a flat bed without using bedrails?: A Little Help needed moving to and from a bed to a chair (including a wheelchair)?: A Little Help needed  standing up from a chair using your arms (e.g., wheelchair or bedside chair)?: A Little Help needed to walk in hospital room?: Total Help needed climbing 3-5 steps with a railing? : Total 6 Click Score: 14    End of Session Equipment Utilized During Treatment: Gait belt Activity Tolerance: Patient tolerated treatment well Patient left: in chair;with call bell/phone within reach;with chair alarm set Nurse Communication: Mobility status PT Visit Diagnosis: Muscle weakness (generalized) (M62.81);Difficulty in walking, not elsewhere classified (R26.2);Repeated falls (R29.6);Other abnormalities of gait and mobility (R26.89)     Time: 1610-9604 PT Time Calculation (min) (ACUTE ONLY): 23 min  Charges:    $Therapeutic Activity: 23-37 mins PT General Charges $$ ACUTE PT VISIT: 1 Visit                     Arlyss Gandy, PT, DPT Acute Rehabilitation Office [phone]    Arlyss Gandy 02/02/2024, 12:17 PM

## 2024-02-03 NOTE — TOC Progression Note (Signed)
Transition of Care [facility]) - Progression Note    Patient Details  Name: Maria [name] MRN: [id number] Date of Birth: 02-10-1956  Transition of Care [facility]) CM/SW Contact  Erin Sons, [place] Phone Number: 02/03/2024, 11:46 AM  Clinical Narrative:     Berkley Harvey approved for [facility] 02/02/24- 02/04/24  Auth ID# [id number]  PTAR scheduled for transport. RN and pt made aware.       Expected Discharge Plan and Services         Expected Discharge Date: 02/02/24                                     Social Determinants of Health (SDOH) Interventions SDOH Screenings   Food Insecurity: No Food Insecurity (01/31/2024)  Housing: Low Risk  (01/31/2024)  Transportation Needs: No Transportation Needs (01/31/2024)  Recent Concern: Transportation Needs - Unmet Transportation Needs (11/25/2023)  Utilities: Not At Risk (01/31/2024)  Alcohol Screen: Low Risk  (12/30/2021)  Depression (PHQ2-9): Medium Risk (01/30/2022)  Financial Resource Strain: Medium Risk (12/30/2021)  Physical Activity: Inactive (12/30/2021)  Social Connections: Moderately Isolated (01/31/2024)  Stress: No Stress Concern Present (12/30/2021)  Tobacco Use: Medium Risk (01/31/2024)    Readmission Risk Interventions    11/29/2023    1:16 PM 09/06/2023    1:10 PM 07/22/2022    2:59 PM  Readmission Risk Prevention Plan  Transportation Screening Complete Complete Complete  PCP or Specialist Appt within 3-5 Days  Complete Complete  HRI or Home Care Consult  Complete Complete  Social Work Consult for Recovery Care Planning/Counseling  Complete Complete  Palliative Care Screening  Not Applicable Complete  Medication Review Oceanographer) Complete Complete Complete  PCP or Specialist appointment within 3-5 days of discharge Complete    HRI or Home Care Consult Complete    SW Recovery Care/Counseling Consult Complete    Palliative Care Screening Not Applicable    Skilled Nursing Facility Patient Refused

## 2024-02-10 ENCOUNTER — Ambulatory Visit: Payer: 59

## 2024-02-21 ENCOUNTER — Ambulatory Visit
Admission: RE | Admit: 2024-02-21 | Discharge: 2024-02-21 | Disposition: A | Discharge status: Home / Self Care | Payer: 59 | Source: Ambulatory Visit | Attending: Student in an Organized Health Care Education/Training Program | Admitting: Student in an Organized Health Care Education/Training Program

## 2024-02-21 DIAGNOSIS — M5412 Radiculopathy, cervical region: Secondary | ICD-10-CM

## 2024-02-21 DIAGNOSIS — M4322 Fusion of spine, cervical region: Secondary | ICD-10-CM

## 2024-04-26 ENCOUNTER — Other Ambulatory Visit: Payer: Self-pay

## 2024-04-26 ENCOUNTER — Encounter (HOSPITAL_COMMUNITY): Payer: Self-pay | Admitting: Internal Medicine

## 2024-04-26 ENCOUNTER — Inpatient Hospital Stay (HOSPITAL_COMMUNITY)
Admission: EM | Admit: 2024-04-26 | Discharge: 2024-05-03 | DRG: 602 | Disposition: A | Discharge status: Home Health | Attending: Internal Medicine | Admitting: Internal Medicine

## 2024-04-26 ENCOUNTER — Emergency Department (HOSPITAL_COMMUNITY)

## 2024-04-26 DIAGNOSIS — Z8249 Family history of ischemic heart disease and other diseases of the circulatory system: Secondary | ICD-10-CM

## 2024-04-26 DIAGNOSIS — I509 Heart failure, unspecified: Secondary | ICD-10-CM

## 2024-04-26 DIAGNOSIS — L03116 Cellulitis of left lower limb: Secondary | ICD-10-CM | POA: Diagnosis present

## 2024-04-26 DIAGNOSIS — K429 Umbilical hernia without obstruction or gangrene: Secondary | ICD-10-CM | POA: Diagnosis present

## 2024-04-26 DIAGNOSIS — G894 Chronic pain syndrome: Secondary | ICD-10-CM | POA: Diagnosis present

## 2024-04-26 DIAGNOSIS — Z6841 Body Mass Index (BMI) 40.0 and over, adult: Secondary | ICD-10-CM

## 2024-04-26 DIAGNOSIS — Z79899 Other long term (current) drug therapy: Secondary | ICD-10-CM | POA: Diagnosis not present

## 2024-04-26 DIAGNOSIS — M793 Panniculitis, unspecified: Secondary | ICD-10-CM | POA: Diagnosis present

## 2024-04-26 DIAGNOSIS — K219 Gastro-esophageal reflux disease without esophagitis: Secondary | ICD-10-CM | POA: Diagnosis present

## 2024-04-26 DIAGNOSIS — I890 Lymphedema, not elsewhere classified: Secondary | ICD-10-CM | POA: Diagnosis present

## 2024-04-26 DIAGNOSIS — Z79891 Long term (current) use of opiate analgesic: Secondary | ICD-10-CM | POA: Diagnosis not present

## 2024-04-26 DIAGNOSIS — L03115 Cellulitis of right lower limb: Principal | ICD-10-CM

## 2024-04-26 DIAGNOSIS — Z825 Family history of asthma and other chronic lower respiratory diseases: Secondary | ICD-10-CM

## 2024-04-26 DIAGNOSIS — G8929 Other chronic pain: Secondary | ICD-10-CM | POA: Diagnosis present

## 2024-04-26 DIAGNOSIS — E1151 Type 2 diabetes mellitus with diabetic peripheral angiopathy without gangrene: Secondary | ICD-10-CM | POA: Diagnosis present

## 2024-04-26 DIAGNOSIS — I110 Hypertensive heart disease with heart failure: Secondary | ICD-10-CM | POA: Diagnosis present

## 2024-04-26 DIAGNOSIS — M7989 Other specified soft tissue disorders: Principal | ICD-10-CM

## 2024-04-26 DIAGNOSIS — M519 Unspecified thoracic, thoracolumbar and lumbosacral intervertebral disc disorder: Secondary | ICD-10-CM | POA: Diagnosis present

## 2024-04-26 DIAGNOSIS — R0602 Shortness of breath: Secondary | ICD-10-CM | POA: Diagnosis not present

## 2024-04-26 DIAGNOSIS — I5043 Acute on chronic combined systolic (congestive) and diastolic (congestive) heart failure: Secondary | ICD-10-CM | POA: Diagnosis not present

## 2024-04-26 DIAGNOSIS — I5033 Acute on chronic diastolic (congestive) heart failure: Secondary | ICD-10-CM | POA: Diagnosis present

## 2024-04-26 DIAGNOSIS — E785 Hyperlipidemia, unspecified: Secondary | ICD-10-CM | POA: Diagnosis present

## 2024-04-26 DIAGNOSIS — Z7985 Long-term (current) use of injectable non-insulin antidiabetic drugs: Secondary | ICD-10-CM

## 2024-04-26 DIAGNOSIS — Z87891 Personal history of nicotine dependence: Secondary | ICD-10-CM | POA: Diagnosis not present

## 2024-04-26 DIAGNOSIS — Z7409 Other reduced mobility: Secondary | ICD-10-CM | POA: Diagnosis present

## 2024-04-26 DIAGNOSIS — F411 Generalized anxiety disorder: Secondary | ICD-10-CM | POA: Diagnosis present

## 2024-04-26 DIAGNOSIS — M545 Low back pain, unspecified: Secondary | ICD-10-CM | POA: Diagnosis present

## 2024-04-26 DIAGNOSIS — Z833 Family history of diabetes mellitus: Secondary | ICD-10-CM

## 2024-04-26 DIAGNOSIS — E119 Type 2 diabetes mellitus without complications: Secondary | ICD-10-CM

## 2024-04-26 DIAGNOSIS — F32A Depression, unspecified: Secondary | ICD-10-CM | POA: Diagnosis present

## 2024-04-26 DIAGNOSIS — L03119 Cellulitis of unspecified part of limb: Secondary | ICD-10-CM | POA: Diagnosis present

## 2024-04-26 LAB — GLUCOSE, CAPILLARY
Glucose-Capillary: 64 mg/dL — ABNORMAL LOW (ref 70–99)
Glucose-Capillary: 102 mg/dL — ABNORMAL HIGH (ref 70–99)
Glucose-Capillary: 69 mg/dL — ABNORMAL LOW (ref 70–99)

## 2024-04-26 LAB — COMPREHENSIVE METABOLIC PANEL WITH GFR
ALT: 12 U/L (ref 0–44)
AST: 17 U/L (ref 15–41)
Albumin: 4 g/dL (ref 3.5–5.0)
Alkaline Phosphatase: 88 U/L (ref 38–126)
Anion gap: 10 (ref 5–15)
BUN: 16 mg/dL (ref 8–23)
CO2: 24 mmol/L (ref 22–32)
Calcium: 8.7 mg/dL — ABNORMAL LOW (ref 8.9–10.3)
Chloride: 105 mmol/L (ref 98–111)
Creatinine, Ser: 1.12 mg/dL — ABNORMAL HIGH (ref 0.44–1.00)
GFR, Estimated: 54 mL/min — ABNORMAL LOW (ref >60)
Glucose, Bld: 99 mg/dL (ref 70–99)
Potassium: 3.7 mmol/L (ref 3.5–5.1)
Sodium: 139 mmol/L (ref 135–145)
Total Bilirubin: 1 mg/dL (ref 0.0–1.2)
Total Protein: 7.4 g/dL (ref 6.5–8.1)

## 2024-04-26 LAB — CBC WITH DIFFERENTIAL/PLATELET
Abs Immature Granulocytes: 0.03 10*3/uL (ref 0.00–0.07)
Basophils Absolute: 0.1 10*3/uL (ref 0.0–0.1)
Basophils Relative: 1 %
Eosinophils Absolute: 0.3 10*3/uL (ref 0.0–0.5)
Eosinophils Relative: 3 %
HCT: 38.7 % (ref 36.0–46.0)
Hemoglobin: 12.1 g/dL (ref 12.0–15.0)
Immature Granulocytes: 0 %
Lymphocytes Relative: 29 %
Lymphs Abs: 2.9 10*3/uL (ref 0.7–4.0)
MCH: 29.3 pg (ref 26.0–34.0)
MCHC: 31.3 g/dL (ref 30.0–36.0)
MCV: 93.7 fL (ref 80.0–100.0)
Monocytes Absolute: 0.7 10*3/uL (ref 0.1–1.0)
Monocytes Relative: 6 %
Neutro Abs: 6.2 10*3/uL (ref 1.7–7.7)
Neutrophils Relative %: 61 %
Platelets: 311 10*3/uL (ref 150–400)
RBC: 4.13 MIL/uL (ref 3.87–5.11)
RDW: 14.4 % (ref 11.5–15.5)
WBC: 10.2 10*3/uL (ref 4.0–10.5)
nRBC: 0 % (ref 0.0–0.2)

## 2024-04-26 LAB — TROPONIN I (HIGH SENSITIVITY): Troponin I (High Sensitivity): 3 ng/L (ref <18)

## 2024-04-26 LAB — BRAIN NATRIURETIC PEPTIDE: B Natriuretic Peptide: 31.4 pg/mL (ref 0.0–100.0)

## 2024-04-26 MED ORDER — ENOXAPARIN SODIUM 40 MG/0.4ML IJ SOSY: 40.0000 mg | PREFILLED_SYRINGE | INTRAMUSCULAR | Status: DC

## 2024-04-26 MED ORDER — INSULIN ASPART 100 UNIT/ML IJ SOLN
0.0000 [IU] | Freq: Every day | INTRAMUSCULAR | Status: DC
  Filled 2024-04-26: qty 0.05

## 2024-04-26 MED ORDER — FLUTICASONE PROPIONATE 50 MCG/ACT NA SUSP
1.0000 | Freq: Two times a day (BID) | NASAL | Status: DC | PRN
Start: 2024-04-26 — End: 2024-05-03

## 2024-04-26 MED ORDER — ACETAMINOPHEN 650 MG RE SUPP: 650.0000 mg | Freq: Four times a day (QID) | RECTAL | Status: DC | PRN

## 2024-04-26 MED ORDER — FUROSEMIDE 10 MG/ML IJ SOLN
80.0000 mg | Freq: Once | INTRAMUSCULAR | Status: AC
  Administered 2024-04-26: 80 mg via INTRAVENOUS
  Filled 2024-04-26: qty 8

## 2024-04-26 MED ORDER — METOPROLOL SUCCINATE ER 25 MG PO TB24
25.0000 mg | ORAL_TABLET | Freq: Every day | ORAL | Status: DC
  Administered 2024-04-27 – 2024-05-03 (×7): 25 mg via ORAL
  Filled 2024-04-26 (×7): qty 1

## 2024-04-26 MED ORDER — ALBUTEROL SULFATE (2.5 MG/3ML) 0.083% IN NEBU: 2.5000 mg | INHALATION_SOLUTION | Freq: Four times a day (QID) | RESPIRATORY_TRACT | Status: DC | PRN

## 2024-04-26 MED ORDER — FLUCONAZOLE 150 MG PO TABS
150.0000 mg | ORAL_TABLET | Freq: Once | ORAL | Status: AC
  Administered 2024-04-26: 150 mg via ORAL
  Filled 2024-04-26: qty 1

## 2024-04-26 MED ORDER — ACETAMINOPHEN 325 MG PO TABS: 650.0000 mg | ORAL_TABLET | Freq: Four times a day (QID) | ORAL | Status: DC | PRN

## 2024-04-26 MED ORDER — BISACODYL 5 MG PO TBEC: 5.0000 mg | DELAYED_RELEASE_TABLET | Freq: Every day | ORAL | Status: DC | PRN

## 2024-04-26 MED ORDER — OXYCODONE HCL 5 MG PO TABS
10.0000 mg | ORAL_TABLET | Freq: Four times a day (QID) | ORAL | Status: DC | PRN
  Administered 2024-04-26 – 2024-05-03 (×16): 10 mg via ORAL
  Filled 2024-04-26 (×16): qty 2

## 2024-04-26 MED ORDER — POLYETHYLENE GLYCOL 3350 17 G PO PACK: 17.0000 g | PACK | Freq: Every day | ORAL | Status: DC | PRN

## 2024-04-26 MED ORDER — CEFAZOLIN SODIUM-DEXTROSE 2-4 GM/100ML-% IV SOLN
2.0000 g | Freq: Three times a day (TID) | INTRAVENOUS | Status: DC
  Administered 2024-04-26 – 2024-04-29 (×8): 2 g via INTRAVENOUS
  Filled 2024-04-26 (×9): qty 100

## 2024-04-26 MED ORDER — OXYCODONE HCL 5 MG PO TABS
5.0000 mg | ORAL_TABLET | ORAL | Status: DC | PRN
  Administered 2024-04-26: 5 mg via ORAL
  Filled 2024-04-26: qty 1

## 2024-04-26 MED ORDER — CLONAZEPAM 0.5 MG PO TABS
1.0000 mg | ORAL_TABLET | Freq: Two times a day (BID) | ORAL | Status: DC | PRN
  Administered 2024-04-27 – 2024-05-02 (×7): 1 mg via ORAL
  Filled 2024-04-26 (×7): qty 2

## 2024-04-26 MED ORDER — PRAMIPEXOLE DIHYDROCHLORIDE 0.25 MG PO TABS
0.5000 mg | ORAL_TABLET | Freq: Every day | ORAL | Status: DC
  Administered 2024-04-26 – 2024-05-02 (×7): 0.5 mg via ORAL
  Filled 2024-04-26 (×7): qty 2

## 2024-04-26 MED ORDER — FUROSEMIDE 10 MG/ML IJ SOLN
40.0000 mg | Freq: Two times a day (BID) | INTRAMUSCULAR | Status: DC
  Administered 2024-04-27: 40 mg via INTRAVENOUS
  Filled 2024-04-26 (×2): qty 4

## 2024-04-26 MED ORDER — PANTOPRAZOLE SODIUM 40 MG PO TBEC
40.0000 mg | DELAYED_RELEASE_TABLET | Freq: Every day | ORAL | Status: DC
  Administered 2024-04-27 – 2024-05-03 (×7): 40 mg via ORAL
  Filled 2024-04-26 (×7): qty 1

## 2024-04-26 MED ORDER — NYSTATIN 100000 UNIT/GM EX POWD
Freq: Two times a day (BID) | CUTANEOUS | Status: DC
  Filled 2024-04-26 (×4): qty 15

## 2024-04-26 MED ORDER — ENOXAPARIN SODIUM 80 MG/0.8ML IJ SOSY
80.0000 mg | PREFILLED_SYRINGE | INTRAMUSCULAR | Status: DC
  Administered 2024-04-26 – 2024-05-01 (×6): 80 mg via SUBCUTANEOUS
  Filled 2024-04-26 (×6): qty 0.8

## 2024-04-26 MED ORDER — NYSTATIN 100000 UNIT/GM EX POWD
Freq: Once | CUTANEOUS | Status: AC
  Filled 2024-04-26: qty 15

## 2024-04-26 MED ORDER — INSULIN ASPART 100 UNIT/ML IJ SOLN
0.0000 [IU] | Freq: Three times a day (TID) | INTRAMUSCULAR | Status: DC
  Administered 2024-04-30 – 2024-05-01 (×2): 1 [IU] via SUBCUTANEOUS
  Administered 2024-05-02: 2 [IU] via SUBCUTANEOUS
  Filled 2024-04-26: qty 0.09

## 2024-04-26 MED ORDER — HYDRALAZINE HCL 20 MG/ML IJ SOLN: 10.0000 mg | Freq: Four times a day (QID) | INTRAMUSCULAR | Status: DC | PRN

## 2024-04-26 NOTE — ED Notes (Signed)
Called lab to add on BNP and trop. °

## 2024-04-26 NOTE — ED Triage Notes (Signed)
 Pt states that her primary provider sent her for bilateral leg swelling. One of them has began weeping. Pt states that she noticed that her legs started swelling a few days ago. Pt states that she recently moved from an apartment that had a lot of mold. Pt currently taking lasix  for her CHF.

## 2024-04-26 NOTE — H&P (Addendum)
 [facility] History and Physical  Hillari Ishida Miklas RUE:[id number] DOB: December 12, 1956 DOA: 04/26/2024 PCP: Sharma Dears, NP  Presented from: Home Chief Complaint: Bilateral lower extremity swelling  History of Present Illness: Maria [name] is a 68 y.o. female with PMH significant for morbid obesity, DM2, HTN, HLD, chronic diastolic CHF, chronic bilateral lower EXTR lymphedema, PAD, anxiety/depression, lumbar disc disease, chronic pain on oxycodone , impaired mobility.  Lives at home alone.  Uses a walker to get around. Reports compliance to torsemide  BID. For the last several days, patient has noted progressive worsening of swelling and weeping of lower extremities.  No fever. Seen by PCP and sent to the ED today  In the ED, patient was afebrile, heart rate in 70s, blood pressure 130s, breathing on room air. Labs with CBC, CMP mostly unremarkable. Chest x-ray unremarkable  Patient was given 1 dose of IV Lasix  in the ED Hospital service was consulted for inpatient management  At the time of my evaluation, patient was lying on bed.  Not in distress. History reviewed in detail as above  Review of Systems:  All systems were reviewed and were negative unless otherwise mentioned in the HPI   Past medical history: Past Medical History:  Diagnosis Date   Acute lymphadenitis 2011   ALLERGIC RHINITIS 08/10/2007   ANXIETY 12/06/2007   Atherosclerotic peripheral vascular disease (HCC) 06/13/2013   Aorta on CT June 2014   Cellulitis 2011   Cervical disc disease 03/09/2012   CHF (congestive heart failure) (HCC)    Chronic pain 03/09/2012   DEPRESSION 12/06/2007   Diabetes (HCC)    DIVERTICULOSIS, Francis 12/06/2007   GERD 12/06/2007   Hepatitis    age 33 hepatitis A   HLD (hyperlipidemia) 05/24/2019   HYPERTENSION 12/06/2007   Lumbar disc disease 03/09/2012   Mesenteric adenitis    MRSA 2006   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 2010   CERVICAL DISC SURGERY  may 24th 2010   COLONOSCOPY WITH PROPOFOL  N/A 07/10/2016   Procedure: COLONOSCOPY WITH PROPOFOL ;  Surgeon: Danette Duos, MD;  Location: [facility];  Service: Gastroenterology;  Laterality: N/A;   COLONOSCOPY WITH PROPOFOL  N/A 09/26/2019   Procedure: COLONOSCOPY WITH PROPOFOL ;  Surgeon: Ace Holder, MD;  Location: [facility];  Service: Gastroenterology;  Laterality: N/A;   ESOPHAGOGASTRODUODENOSCOPY (EGD) WITH PROPOFOL  N/A 07/10/2016   Procedure: ESOPHAGOGASTRODUODENOSCOPY (EGD) WITH PROPOFOL ;  Surgeon: Danette Duos, MD;  Location: [facility];  Service: Gastroenterology;  Laterality: N/A;   POLYPECTOMY  09/26/2019   Procedure: POLYPECTOMY;  Surgeon: Ace Holder, MD;  Location: [facility];  Service: Gastroenterology;;   RADIOLOGY WITH ANESTHESIA N/A 07/30/2023   Procedure: MRI WITH ANESTHESIA - THORACIC LUMBAR WITHOUT CONSTRAST;  Surgeon: Radiologist, Medication, MD;  Location: [facility];  Service: Radiology;  Laterality: N/A;   s/p ovary cyst     s/p right knee arthroscopy     Dr. Agatha Horsfall ortho    Social History:  reports that she quit smoking about 15 years ago. Her smoking use included cigarettes. She started smoking about 45 years ago. She has never used smokeless tobacco. She reports that she does not drink alcohol  and does not use drugs.  Allergies:  Allergies  Allergen Reactions   Amoxicillin -Pot Clavulanate Nausea  And Vomiting and Other (See Comments)    Projectile vomiting   Adhesive [Tape] Other (See Comments)    Tears skin off - use paper tape    Codeine Other (See Comments)    hallucinations   Crestor  [Rosuvastatin  Calcium ] Other (See Comments)    Severe muscle cramps   Cyclobenzaprine  Other (See Comments)     Hallucinations    Cymbalta  [Duloxetine  Hcl] Other (See Comments)    Hallucinations    Dilaudid  [Hydromorphone ] Other (See Comments)    Headache and Dizziness   Gabapentin  Other (See Comments)    Trembling   Lyrica [Pregabalin] Other (See Comments)    weakness   Morphine  And Codeine Nausea And Vomiting and Other (See Comments)    Migraine headaches   Vicodin [Hydrocodone-Acetaminophen ] Other (See Comments)    hallucinations   Latex Rash   Amoxicillin -pot clavulanate, Adhesive [tape], Codeine, Crestor  [rosuvastatin  calcium ], Cyclobenzaprine , Cymbalta  [duloxetine  hcl], Dilaudid  [hydromorphone ], Gabapentin , Lyrica [pregabalin], Morphine  and codeine, Vicodin [hydrocodone-acetaminophen ], and Latex   Family history:  Family History  Problem Relation Age of Onset   Heart disease Mother    Hypertension Mother    Diabetes Mother    Heart failure Mother    Asthma Sister    Anxiety disorder Sister    Depression Sister    Hypertension Father    Asthma Daughter    Bipolar disorder Daughter    Cancer Maternal Uncle        Dugo   Cancer Other        ovarian   Liver cancer Paternal Grandmother        ????     Physical Exam: Vitals:   04/26/24 1730 04/26/24 1915 04/26/24 2015 04/26/24 2030  BP: (!) 189/82 (!) 189/95 (!) 140/79   Pulse: 78 75 74   Resp: 18 15 16    Temp:  98 F (36.7 C) 97.8 F (36.6 C)   TempSrc:   Oral   SpO2: 97% 98% 98%   Weight:    (!) 154 kg  Height:    5\' 3"  (1.6 m)   Wt Readings from Last 3 Encounters:  04/26/24 (!) 154 kg  02/01/24 (!) 154.3 kg  12/29/23 (!) 153.1 kg   Body mass index is 60.14 kg/m.  General exam: Pleasant, elderly, morbidly obese Caucasian female. Skin: No rashes, lesions or ulcers. HEENT: Atraumatic, normocephalic, no obvious bleeding Lungs: Clear to auscultation bilaterally,  CVS: S1, S2, no murmur,   GI/Abd: Soft, nontender, distended from obesity, bowel sound present,  fungal infection of the abdominal folds noted CNS:  Alert, awake, oriented x 3 Psychiatry: Mood appropriate,  Extremities: 2-3+ bilateral lower extremity pedal edema, redness and oozing R>L, no calf tenderness,     ----------------------------------------------------------------------------------------------------------------------------------------- ----------------------------------------------------------------------------------------------------------------------------------------- -----------------------------------------------------------------------------------------------------------------------------------------  Assessment/Plan: Acute on chronic cellulitis of bilateral lower extremity Bilateral lower extremity lymphedema Presented with progressive worsening of swelling and weeping of lower extremities. Evidence of cellulitis R>L No open wound or suspicion of abscess No fever, WC count normal Start on IV Ancef  Given IV Lasix  in the ED.  Continue IV Lasix  40 mg twice daily Would recommend Ace wrapping, leg elevation Outpatient follow-up at lymphedema clinic would be helpful Recent Labs  Lab 04/26/24 1556  WBC 10.2   Chronic diastolic and right-sided CHF HTN Blood pressure elevated to 180s in the ED Most recent Echo from Feb 2024 showed ED 65-70%, G2DD, moderately elevated PASP to 47. PTA meds- Toprol , torsemide  twice daily, Aldactone  Resume Toprol .  IV Lasix  as above.  Keep Aldactone  on  hold while on IV Lasix   Fungal panniculitis Nystatin  powder order  Type 2 diabetes mellitus A1c 5.1 on February 2025 PTA meds-on Mounjaro every Wednesday Started on SSI/Accu-Cheks No results for input(s): "GLUCAP" in the last 168 hours.  PAD, HLD I do not see any statin or blood thinner on her med list.  Morbid Obesity  Body mass index is 60.14 kg/m. Patient has been advised to make an attempt to improve diet and exercise patterns to aid in weight loss.  Chronic pain Chronic lumbar disc disease Reports taking oxycodone  10 mg  every 8 hours Ordered.  Impaired mobility PT eval  Anxiety/depression Klonopin  as needed,  Goals of care:   Code Status: Full Code    DVT prophylaxis: Lovenox  subcu    Antimicrobials: IV Ancef  Fluid: None Consultants: None Family Communication: None at bedside  Status: Inpatient Level of care:  Telemetry   Patient is from: Home Anticipated d/c to: Hopefully home in 2 to 3 days eventually  Diet: Diet Order             Diet heart healthy/carb modified Room service appropriate? Yes; Fluid consistency: Thin  Diet effective now                    ------------------------------------------------------------------------------------- Severity of Illness: The appropriate patient status for this patient is INPATIENT. Inpatient status is judged to be reasonable and necessary in order to provide the required intensity of service to ensure the patient's safety. The patient's presenting symptoms, physical exam findings, and initial radiographic and laboratory data in the context of their chronic comorbidities is felt to place them at high risk for further clinical deterioration. Furthermore, it is not anticipated that the patient will be medically stable for discharge from the hospital within 2 midnights of admission.   * I certify that at the point of admission it is my clinical judgment that the patient will require inpatient hospital care spanning beyond 2 midnights from the point of admission due to high intensity of service, high risk for further deterioration and high frequency of surveillance required.* -------------------------------------------------------------------------------------   Home Meds: Prior to Admission medications   Medication Sig Start Date End Date Taking? Authorizing Provider  acetaminophen  (TYLENOL ) 500 MG tablet Take 1,000 mg by mouth every 8 (eight) hours as needed for moderate pain (pain score 4-6) or headache.    [provider]   albuterol  (VENTOLIN  HFA) 108 (90 Base) MCG/ACT inhaler Inhale 1 puff into the lungs every 6 (six) hours as needed for wheezing or shortness of breath.    [provider]  cholecalciferol  (VITAMIN D3) 25 MCG (1000 UNIT) tablet Take 1,000 Units by mouth daily.    [provider]  clonazePAM  (KLONOPIN ) 1 MG tablet Take 1 tablet (1 mg total) by mouth 2 (two) times daily as needed for up to 5 days. for anxiety 02/02/24 02/07/24  Vada Garibaldi, MD  cyanocobalamin  1000 MCG tablet Take 1 tablet (1,000 mcg total) by mouth daily. 07/18/23   Limmie Ren, MD  famotidine  (PEPCID ) 40 MG tablet Take 40 mg by mouth at bedtime. 10/04/23   [provider]  fluticasone  (FLONASE ) 50 MCG/ACT nasal spray Place 1 spray into both nostrils daily. 12/07/22   [provider]  Metamucil Fiber CHEW Chew 3 each by mouth at bedtime.    [provider]  metoprolol  succinate (TOPROL -XL) 25 MG 24 hr tablet Take 1 tablet (25 mg total) by mouth daily. 07/18/23   Limmie Ren, MD  NARCAN  4 MG/0.1ML LIQD nasal spray kit Place 0.4 mg into the nose daily as needed (opioid overdose). 04/25/19   [provider]  nystatin  (MYCOSTATIN /NYSTOP ) powder Apply 1 Application topically daily. 01/22/24   [provider]  ondansetron  (ZOFRAN -ODT) 4 MG disintegrating tablet Take 4 mg by mouth every 8 (eight) hours as needed for nausea or vomiting. 09/14/23   [provider]  pantoprazole  (PROTONIX ) 40 MG tablet TAKE ONE TABLET BY MOUTH DAILY Patient taking differently: Take 40 mg by mouth daily. 01/29/22   Roslyn Coombe, MD  Potassium Chloride  ER 20 MEQ TBCR Take 40 mEq by mouth in the morning and at bedtime. 11/16/23   [provider]  pramipexole  (MIRAPEX ) 0.5 MG tablet Take 0.5 mg by mouth at bedtime as needed (Muscle spasms). 12/16/23   [provider]  spironolactone  (ALDACTONE ) 25 MG tablet Take 0.5 tablets (12.5 mg total) by mouth daily. Patient taking  differently: Take 25 mg by mouth daily. 08/14/22   Lasalle Pointer, NP  tirzepatide [facility]) 15 MG/0.5ML Pen Inject 15 mg into the skin once a week. Every Monday    [provider]  torsemide  (DEMADEX ) 20 MG tablet Take 2 tablets (40 mg total) by mouth daily. Patient taking differently: Take 20 mg by mouth daily. 11/30/23   Faith Homes, MD    Labs on Admission:   CBC: Recent Labs  Lab 04/26/24 1556  WBC 10.2  NEUTROABS 6.2  HGB 12.1  HCT 38.7  MCV 93.7  PLT 311    Basic Metabolic Panel: Recent Labs  Lab 04/26/24 1556  NA 139  K 3.7  CL 105  CO2 24  GLUCOSE 99  BUN 16  CREATININE 1.12*  CALCIUM  8.7*    Liver Function Tests: Recent Labs  Lab 04/26/24 1556  AST 17  ALT 12  ALKPHOS 88  BILITOT 1.0  PROT 7.4  ALBUMIN 4.0   No results for input(s): "LIPASE", "AMYLASE" in the last 168 hours. No results for input(s): "AMMONIA" in the last 168 hours.  Cardiac Enzymes: No results for input(s): "CKTOTAL", "CKMB", "CKMBINDEX", "TROPONINI" in the last 168 hours.  BNP (last 3 results) Recent Labs    12/28/23 0254 01/31/24 0504 04/26/24 1556  BNP 37.1 59.9 31.4    ProBNP (last 3 results) No results for input(s): "PROBNP" in the last 8760 hours.  CBG: No results for input(s): "GLUCAP" in the last 168 hours.  Lipase     Component Value Date/Time   LIPASE 26 10/09/2023 1100     Urinalysis    Component Value Date/Time   COLORURINE YELLOW 01/30/2024 2154   APPEARANCEUR HAZY (A) 01/30/2024 2154   LABSPEC 1.016 01/30/2024 2154   PHURINE 5.0 01/30/2024 2154   GLUCOSEU NEGATIVE 01/30/2024 2154   GLUCOSEU NEGATIVE 01/30/2022 1342   HGBUR SMALL (A) 01/30/2024 2154   BILIRUBINUR NEGATIVE 01/30/2024 2154   KETONESUR NEGATIVE 01/30/2024 2154   PROTEINUR NEGATIVE 01/30/2024 2154   UROBILINOGEN 0.2 01/30/2022 1342   NITRITE NEGATIVE 01/30/2024 2154   LEUKOCYTESUR LARGE (A) 01/30/2024 2154     Drugs of Abuse  No results found for: "LABOPIA",  "COCAINSCRNUR", "LABBENZ", "AMPHETMU", "THCU", "LABBARB"    Radiological Exams on Admission: DG Chest Port 1 View Result Date: 04/26/2024 CLINICAL DATA:  Shortness of breath.  Bilateral leg swelling. EXAM: PORTABLE CHEST 1 VIEW COMPARISON:  01/31/2024. FINDINGS: Bilateral lung fields are clear. No pulmonary edema. Bilateral costophrenic angles are clear. Stable mildly enlarged cardio-mediastinal silhouette. No acute osseous abnormalities. Lower cervical spinal fixation  hardware noted. The soft tissues are within normal limits. IMPRESSION: No active disease. Electronically Signed   By: Beula Brunswick M.D.   On: 04/26/2024 16:37     Signed, Hoyt Macleod, MD [facility] 04/26/2024

## 2024-04-26 NOTE — Consult Note (Signed)
 WOC Nurse Consult Note: patient with lymphedema and history of venous stasis ulcers  Reason for Consult: B lower leg cellulitis  Wound type: 1.  Full and partial thickness B lower legs r/t venous stasis  2.  Intertriginous dermatitis underneath pannus  ICD-10 CM Codes for Irritant Dermatitis L30.4  - Erythema intertrigo. Also used for abrasion of the hand, chafing of the skin, dermatitis due to sweating and friction, friction dermatitis, friction eczema, and genital/thigh intertrigo.  Pressure Injury POA: NA  Measurement: see nursing flowsheet  Wound bed: 1.  Scattered full thickness wounds pink moist  2.  Erythema with scattered partial thickness skin loss underneath pannus  Drainage (amount, consistency, odor) see nursing flowsheet  Periwound: erythema and edema to B lower legs; chronic skin changes consistent with lymphedema  Dressing procedure/placement/frequency:  Cleanse B lower legs (intact skin and open ulcerations) with Vashe wound cleanser Timm Foot [phone]) do not rinse and allow to air dry. Apply Xeroform gauze to lower leg wounds, cover with ABD pads and secure with Kerlix roll gauze beginning right above toes and ending right below knees.  Apply Ace bandage wrapped in same fashion as Kerlix for light compression.  Patient should keep legs elevated as much as possible.  Cleanse underneath pannus with Vashe wound cleanser Timm Foot [phone]) do not rinse and allow to air dry. Apply Ranny Bye Order Timm Foot # [phone] Measure and cut length of InterDry to fit in skin folds that have skin breakdown Tuck InterDry fabric into skin folds in a single layer, allow for 2 inches of overhang from skin edges to allow for wicking to occur May remove to bathe; dry area thoroughly and then tuck into affected areas again Do not apply any creams or ointments when using InterDry DO NOT THROW AWAY FOR 5 DAYS unless soiled with stool DO NOT [facility] product, this will inactivate the silver in the material  New  sheet of Interdry should be applied after 5 days of use if patient continues to have skin breakdown    POC discussed with bedside nurse. WOC team will not follow. Re-consult if further needs arise.   Thank you,    Ronni Colace MSN, RN-BC, [employer] [phone]

## 2024-04-26 NOTE — ED Provider Notes (Signed)
  EMERGENCY DEPARTMENT AT [facility] Provider Note   CSN: [id number] Arrival date & time: 04/26/24  1513     History  Chief Complaint  Patient presents with   Leg Swelling    Maria [name] is a 68 y.o. female history of diastolic heart failure, hypertension, lymphedema here presenting with worsening leg swelling.  Patient states that she moved to an apartment that has mold in it several weeks ago.  She states that she subsequently moved out this week.  She states that her legs has been progressively more swollen for the last week or so.  She states that it has some clear drainage from it.  Patient takes torsemide  40 mg daily but did not help her leg swelling.  Patient has some subjective shortness of breath as well.  Patient also noticed ascending faction under her pannus.  Patient called her doctor and was told to come here for further evaluation.  Of note, patient has multiple admissions for leg swelling and CHF exacerbation and lymphedema.   The history is provided by the patient.       Home Medications Prior to Admission medications   Medication Sig Start Date End Date Taking? Authorizing Provider  acetaminophen  (TYLENOL ) 500 MG tablet Take 1,000 mg by mouth every 8 (eight) hours as needed for moderate pain (pain score 4-6) or headache.    [provider]  albuterol  (VENTOLIN  HFA) 108 (90 Base) MCG/ACT inhaler Inhale 1 puff into the lungs every 6 (six) hours as needed for wheezing or shortness of breath.    [provider]  cholecalciferol  (VITAMIN D3) 25 MCG (1000 UNIT) tablet Take 1,000 Units by mouth daily.    [provider]  clonazePAM  (KLONOPIN ) 1 MG tablet Take 1 tablet (1 mg total) by mouth 2 (two) times daily as needed for up to 5 days. for anxiety 02/02/24 02/07/24  Vada Garibaldi, MD  cyanocobalamin  1000 MCG tablet Take 1 tablet (1,000 mcg total) by mouth daily. 07/18/23   Limmie Ren, MD  famotidine  (PEPCID ) 40 MG tablet  Take 40 mg by mouth at bedtime. 10/04/23   [provider]  fluticasone  (FLONASE ) 50 MCG/ACT nasal spray Place 1 spray into both nostrils daily. 12/07/22   [provider]  Metamucil Fiber CHEW Chew 3 each by mouth at bedtime.    [provider]  metoprolol  succinate (TOPROL -XL) 25 MG 24 hr tablet Take 1 tablet (25 mg total) by mouth daily. 07/18/23   Limmie Ren, MD  NARCAN  4 MG/0.1ML LIQD nasal spray kit Place 0.4 mg into the nose daily as needed (opioid overdose). 04/25/19   [provider]  nystatin  (MYCOSTATIN /NYSTOP ) powder Apply 1 Application topically daily. 01/22/24   [provider]  ondansetron  (ZOFRAN -ODT) 4 MG disintegrating tablet Take 4 mg by mouth every 8 (eight) hours as needed for nausea or vomiting. 09/14/23   [provider]  pantoprazole  (PROTONIX ) 40 MG tablet TAKE ONE TABLET BY MOUTH DAILY Patient taking differently: Take 40 mg by mouth daily. 01/29/22   Roslyn Coombe, MD  Potassium Chloride  ER 20 MEQ TBCR Take 40 mEq by mouth in the morning and at bedtime. 11/16/23   [provider]  pramipexole  (MIRAPEX ) 0.5 MG tablet Take 0.5 mg by mouth at bedtime as needed (Muscle spasms). 12/16/23   [provider]  spironolactone  (ALDACTONE ) 25 MG tablet Take 0.5 tablets (12.5 mg total) by mouth daily. Patient taking differently: Take 25 mg by mouth daily. 08/14/22  Lasalle Pointer, NP  tirzepatide [facility]) 15 MG/0.5ML Pen Inject 15 mg into the skin once a week. Every Monday    [provider]  torsemide  (DEMADEX ) 20 MG tablet Take 2 tablets (40 mg total) by mouth daily. Patient taking differently: Take 20 mg by mouth daily. 11/30/23   Faith Homes, MD      Allergies    Amoxicillin -pot clavulanate, Adhesive [tape], Codeine, Crestor  [rosuvastatin  calcium ], Cyclobenzaprine , Cymbalta  [duloxetine  hcl], Dilaudid  [hydromorphone ], Gabapentin , Lyrica [pregabalin], Morphine  and codeine, Vicodin  [hydrocodone-acetaminophen ], and Latex    Review of Systems   Review of Systems  Respiratory:  Positive for shortness of breath.   Musculoskeletal:        Leg swelling  All other systems reviewed and are negative.   Physical Exam Updated Vital Signs BP 134/68 (BP Location: Left Arm)   Pulse 78   Temp 98.1 F (36.7 C) (Oral)   Resp 16   Wt (!) 154 kg   SpO2 95%   BMI 60.14 kg/m  Physical Exam Vitals and nursing note reviewed.  Constitutional:      Appearance: Normal appearance.  HENT:     Head: Normocephalic.     Nose: Nose normal.     Mouth/Throat:     Mouth: Mucous membranes are moist.  Eyes:     Extraocular Movements: Extraocular movements intact.     Pupils: Pupils are equal, round, and reactive to light.  Cardiovascular:     Rate and Rhythm: Normal rate and regular rhythm.     Pulses: Normal pulses.     Heart sounds: Normal heart sounds.  Pulmonary:     Effort: Pulmonary effort is normal.     Comments: Crackles bilaterally  Abdominal:     General: Abdomen is flat.     Palpations: Abdomen is soft.     Comments: Patient does have a easily reducible umbilical hernia.  Patient also has a large pannus and there is evidence of candidal infection  Musculoskeletal:     Cervical back: Normal range of motion and neck supple.     Comments: 2+ edema bilaterally with venous stasis changes.  No obvious cellulitis  Skin:    Capillary Refill: Capillary refill takes less than 2 seconds.  Neurological:     General: No focal deficit present.     Mental Status: She is alert and oriented to person, place, and time.  Psychiatric:        Mood and Affect: Mood normal.        Behavior: Behavior normal.     ED Results / Procedures / Treatments   Labs (all labs ordered are listed, but only abnormal results are displayed) Labs Reviewed  CBC WITH DIFFERENTIAL/PLATELET  COMPREHENSIVE METABOLIC PANEL WITH GFR  BRAIN NATRIURETIC PEPTIDE  TROPONIN I (HIGH SENSITIVITY)     EKG None  Radiology DG Chest Port 1 View Result Date: 04/26/2024 CLINICAL DATA:  Shortness of breath.  Bilateral leg swelling. EXAM: PORTABLE CHEST 1 VIEW COMPARISON:  01/31/2024. FINDINGS: Bilateral lung fields are clear. No pulmonary edema. Bilateral costophrenic angles are clear. Stable mildly enlarged cardio-mediastinal silhouette. No acute osseous abnormalities. Lower cervical spinal fixation hardware noted. The soft tissues are within normal limits. IMPRESSION: No active disease. Electronically Signed   By: Beula Brunswick M.D.   On: 04/26/2024 16:37    Procedures Procedures    Medications Ordered in ED Medications  fluconazole  (DIFLUCAN ) tablet 150 mg (has no administration in time range)  nystatin  (MYCOSTATIN /NYSTOP ) topical powder (has no administration in  time range)    ED Course/ Medical Decision Making/ A&P                                 Medical Decision Making Moorea Snooks Delahoussaye is a 68 y.o. female presenting with leg swelling and shortness of breath.  Patient also has evidence of candidal infection.  Will get CBC and CMP and BNP to rule out heart failure.  Will also give Diflucan  and nystatin  for candidal infection.  5:56 PM BNP is normal and creatinine is normal.  Patient given 80 mg of Lasix .  At this point hospitalist to admit for diuresis.  Problems Addressed: Leg swelling: acute illness or injury Shortness of breath: acute illness or injury  Amount and/or Complexity of Data Reviewed Labs: ordered. Decision-making details documented in ED Course. Radiology: ordered and independent interpretation performed. Decision-making details documented in ED Course. ECG/medicine tests: ordered and independent interpretation performed. Decision-making details documented in ED Course.  Risk Prescription drug management.    Final Clinical Impression(s) / ED Diagnoses Final diagnoses:  None    Rx / DC Orders ED Discharge Orders     None         Dalene Duck, MD 04/26/24 1758

## 2024-04-27 DIAGNOSIS — M7989 Other specified soft tissue disorders: Secondary | ICD-10-CM

## 2024-04-27 DIAGNOSIS — I5043 Acute on chronic combined systolic (congestive) and diastolic (congestive) heart failure: Secondary | ICD-10-CM | POA: Diagnosis not present

## 2024-04-27 DIAGNOSIS — R0602 Shortness of breath: Secondary | ICD-10-CM

## 2024-04-27 LAB — POTASSIUM: Potassium: 4.2 mmol/L (ref 3.5–5.1)

## 2024-04-27 LAB — CBC
HCT: 33.8 % — ABNORMAL LOW (ref 36.0–46.0)
Hemoglobin: 10.6 g/dL — ABNORMAL LOW (ref 12.0–15.0)
MCH: 29.3 pg (ref 26.0–34.0)
MCHC: 31.4 g/dL (ref 30.0–36.0)
MCV: 93.4 fL (ref 80.0–100.0)
Platelets: 243 10*3/uL (ref 150–400)
RBC: 3.62 MIL/uL — ABNORMAL LOW (ref 3.87–5.11)
RDW: 14.1 % (ref 11.5–15.5)
WBC: 8.9 10*3/uL (ref 4.0–10.5)
nRBC: 0 % (ref 0.0–0.2)

## 2024-04-27 LAB — BASIC METABOLIC PANEL WITH GFR
Anion gap: 8 (ref 5–15)
BUN: 15 mg/dL (ref 8–23)
CO2: 31 mmol/L (ref 22–32)
Calcium: 8.4 mg/dL — ABNORMAL LOW (ref 8.9–10.3)
Chloride: 100 mmol/L (ref 98–111)
Creatinine, Ser: 1.17 mg/dL — ABNORMAL HIGH (ref 0.44–1.00)
GFR, Estimated: 51 mL/min — ABNORMAL LOW (ref >60)
Glucose, Bld: 92 mg/dL (ref 70–99)
Potassium: 2.9 mmol/L — ABNORMAL LOW (ref 3.5–5.1)
Sodium: 139 mmol/L (ref 135–145)

## 2024-04-27 LAB — GLUCOSE, CAPILLARY
Glucose-Capillary: 89 mg/dL (ref 70–99)
Glucose-Capillary: 96 mg/dL (ref 70–99)
Glucose-Capillary: 101 mg/dL — ABNORMAL HIGH (ref 70–99)
Glucose-Capillary: 94 mg/dL (ref 70–99)

## 2024-04-27 LAB — BRAIN NATRIURETIC PEPTIDE
B Natriuretic Peptide: 29.6 pg/mL (ref 0.0–100.0)
B Natriuretic Peptide: 52.8 pg/mL (ref 0.0–100.0)

## 2024-04-27 LAB — MRSA NEXT GEN BY PCR, NASAL: MRSA by PCR Next Gen: NOT DETECTED

## 2024-04-27 LAB — MAGNESIUM: Magnesium: 2.2 mg/dL (ref 1.7–2.4)

## 2024-04-27 MED ORDER — DIAZEPAM 5 MG/ML IJ SOLN
5.0000 mg | Freq: Once | INTRAMUSCULAR | Status: AC
  Administered 2024-04-27: 5 mg via INTRAVENOUS

## 2024-04-27 MED ORDER — MAGNESIUM SULFATE 2 GM/50ML IV SOLN
2.0000 g | Freq: Once | INTRAVENOUS | Status: AC
  Administered 2024-04-27: 2 g via INTRAVENOUS
  Filled 2024-04-27: qty 50

## 2024-04-27 MED ORDER — POTASSIUM CHLORIDE CRYS ER 20 MEQ PO TBCR: 40.0000 meq | EXTENDED_RELEASE_TABLET | Freq: Two times a day (BID) | ORAL | Status: DC

## 2024-04-27 MED ORDER — POTASSIUM CHLORIDE 20 MEQ PO PACK
60.0000 meq | PACK | Freq: Once | ORAL | Status: AC
  Administered 2024-04-27: 60 meq via ORAL
  Filled 2024-04-27: qty 3

## 2024-04-27 MED ORDER — POTASSIUM CHLORIDE 10 MEQ/100ML IV SOLN: 10.0000 meq | INTRAVENOUS | Status: DC

## 2024-04-27 MED ORDER — POTASSIUM CHLORIDE 10 MEQ/100ML IV SOLN
10.0000 meq | INTRAVENOUS | Status: AC
  Administered 2024-04-27 (×2): 10 meq via INTRAVENOUS
  Filled 2024-04-27 (×2): qty 100

## 2024-04-27 MED ORDER — METHOCARBAMOL 500 MG PO TABS
750.0000 mg | ORAL_TABLET | Freq: Once | ORAL | Status: AC
  Administered 2024-04-27: 750 mg via ORAL

## 2024-04-27 MED ORDER — DIAZEPAM 5 MG/ML IJ SOLN
2.5000 mg | Freq: Once | INTRAMUSCULAR | Status: DC
  Filled 2024-04-27: qty 2

## 2024-04-27 MED ORDER — METHOCARBAMOL 500 MG PO TABS
750.0000 mg | ORAL_TABLET | Freq: Three times a day (TID) | ORAL | Status: DC | PRN
  Administered 2024-04-27: 750 mg via ORAL
  Filled 2024-04-27 (×2): qty 2

## 2024-04-27 MED ORDER — KETOROLAC TROMETHAMINE 15 MG/ML IJ SOLN
15.0000 mg | Freq: Once | INTRAMUSCULAR | Status: AC
  Administered 2024-04-27: 15 mg via INTRAVENOUS
  Filled 2024-04-27: qty 1

## 2024-04-27 NOTE — Progress Notes (Signed)
 [facility]  PROGRESS NOTE  Maria [name] ZOX:[id number] DOB: 05/15/1956 DOA: 04/26/2024 PCP: Sharma Dears, NP   Brief HPI:   68 y.o. female with PMH significant for morbid obesity, DM2, HTN, HLD, chronic diastolic CHF, chronic bilateral lower EXTR lymphedema, PAD, anxiety/depression, lumbar disc disease, chronic pain on oxycodone , impaired mobility.  Lives at home alone.  Uses a walker to get around. Reports compliance to torsemide  BID. For the last several days, patient has noted progressive worsening of swelling and weeping of lower extremities.  No fever.    Assessment/Plan:   Acute on chronic cellulitis of bilateral lower extremity Bilateral lower extremity lymphedema Presented with progressive worsening of swelling and weeping of lower extremities. Evidence of cellulitis R>L No open wound or suspicion of abscess No fever, WC count normal Start on IV Ancef  Given IV Lasix  in the ED. started on Lasix  40 mg IV twice daily; will hold further doses of Lasix .  Ace wrapping, leg elevation Outpatient follow-up at lymphedema clinic would be helpful   Chronic diastolic and right-sided CHF HTN Most recent Echo from Feb 2024 showed ED 65-70%, G2DD, moderately elevated PASP to 47. PTA meds- Toprol , torsemide  twice daily, Aldactone  Resume Toprol .  Patient was started on Lasix  40 mg IV twice daily.  Will hold further doses of Lasix . Check BNP  Umbilical hernia - Patient has erythema and tenderness at the umbilicus -Denies abdominal pain -Will obtain CT abdomen/pelvis with contrast to rule out incarcerated umbilical hernia  Fungal panniculitis Nystatin  powder order   Type 2 diabetes mellitus A1c 5.1 on February 2025 PTA meds-on Mounjaro every Wednesday Started on SSI/Accu-Cheks    PAD, HLD I do not see any statin or blood thinner on her med list.   Morbid Obesity  Body mass index is 60.14 kg/m. Patient has been advised to make an attempt to improve diet and  exercise patterns to aid in weight loss.   Chronic pain Chronic lumbar disc disease Reports taking oxycodone  10 mg every 8 hours Ordered.   Impaired mobility PT eval   Anxiety/depression Klonopin  as needed,    Medications     enoxaparin  (LOVENOX ) injection  80 mg Subcutaneous Q24H   furosemide   40 mg Intravenous BID   insulin  aspart  0-5 Units Subcutaneous QHS   insulin  aspart  0-9 Units Subcutaneous TID WC   metoprolol  succinate  25 mg Oral Daily   nystatin    Topical BID   pantoprazole   40 mg Oral Daily   pramipexole   0.5 mg Oral QHS     Data Reviewed:   CBG:  Recent Labs  Lab 04/26/24 2237 04/26/24 2324 04/27/24 0735 04/27/24 1110 04/27/24 1614  GLUCAP 69* 102* 89 96 101*    SpO2: 96 %    Vitals:   04/26/24 2150 04/27/24 0532 04/27/24 1023 04/27/24 1419  BP: (!) 139/53 (!) 126/57 (!) 146/54 (!) 149/59  Pulse: 69 76 74 73  Resp: 17 20 17 16   Temp: 98.1 F (36.7 C) 98.1 F (36.7 C) 97.8 F (36.6 C) 98.2 F (36.8 C)  TempSrc: Oral Oral Oral Oral  SpO2: 100% 100% 97% 96%  Weight:      Height:          Data Reviewed:  Basic Metabolic Panel: Recent Labs  Lab 04/26/24 1556 04/27/24 0413 04/27/24 1535  NA 139 139  --   K 3.7 2.9* 4.2  CL 105 100  --   CO2 24 31  --   GLUCOSE 99 92  --  BUN 16 15  --   CREATININE 1.12* 1.17*  --   CALCIUM  8.7* 8.4*  --   MG  --  2.2  --     CBC: Recent Labs  Lab 04/26/24 1556 04/27/24 0412  WBC 10.2 8.9  NEUTROABS 6.2  --   HGB 12.1 10.6*  HCT 38.7 33.8*  MCV 93.7 93.4  PLT 311 243    LFT Recent Labs  Lab 04/26/24 1556  AST 17  ALT 12  ALKPHOS 88  BILITOT 1.0  PROT 7.4  ALBUMIN 4.0     Antibiotics: Anti-infectives (From admission, onward)    Start     Dose/Rate Route Frequency Ordered Stop   04/26/24 2200  ceFAZolin  (ANCEF ) IVPB 2g/100 mL premix        2 g 200 mL/hr over 30 Minutes Intravenous Every 8 hours 04/26/24 1853 05/03/24 2159   04/26/24 1615  fluconazole  (DIFLUCAN )  tablet 150 mg        150 mg Oral  Once 04/26/24 1609 04/26/24 1641        DVT prophylaxis: Lovenox   Code Status: Full code  Family Communication:    CONSULTS    Subjective   Complains of leg cramps.   Objective    Physical Examination:   General-appears in no acute distress Heart-S1-S2, regular, no murmur auscultated Lungs-clear to auscultation bilaterally, no wheezing or crackles auscultated Abdomen-soft, nontender, no organomegaly Extremities-no edema in the lower extremities Neuro-alert, oriented x3, no focal deficit noted   Status is: Inpatient:             Maria [name]   [facility] If 7PM-7AM, please contact night-coverage at [email or web address], Office  [phone]   04/27/2024, 5:19 PM  LOS: 1 day

## 2024-04-27 NOTE — Evaluation (Signed)
 Physical Therapy Evaluation Patient Details Name: Maria [name] MRN: [id number] DOB: 1956/05/16 Today's Date: 04/27/2024  History of Present Illness  68 y.o. female admitted 04/26/24 with Acute on chronic cellulitis of bilateral lower extremity,  Bilateral lower extremity lymphedema, and Chronic diastolic and right-sided CHF.   PMH significant for morbid obesity, DM2, HTN, HLD, chronic diastolic CHF, chronic bilateral lower EXTR lymphedema, PAD, anxiety/depression, lumbar disc disease, chronic pain on oxycodone , impaired mobility.  Clinical Impression  Pt admitted with above diagnosis.  Pt currently with functional limitations due to the deficits listed below (see PT Problem List). Pt will benefit from acute skilled PT to increase their independence and safety with mobility to allow discharge.  Pt assisted to bathroom and then agreeable to remain OOB in recliner.  Pt mostly stays in her apt and uses RW at baseline.  Pt agreeable to f/u HHPT upon d/c.         If plan is discharge home, recommend the following:     Can travel by private vehicle        Equipment Recommendations None recommended by PT  Recommendations for Other Services       Functional Status Assessment Patient has had a recent decline in their functional status and demonstrates the ability to make significant improvements in function in a reasonable and predictable amount of time.     Precautions / Restrictions Precautions Precautions: Fall      Mobility  Bed Mobility Overal bed mobility: Needs Assistance Bed Mobility: Supine to Sit     Supine to sit: Contact guard          Transfers Overall transfer level: Needs assistance Equipment used: Rolling walker (2 wheels) Transfers: Sit to/from Stand Sit to Stand: Min assist           General transfer comment: assist to rise and stabilize, cues for hand placement    Ambulation/Gait Ambulation/Gait assistance: Contact guard assist Gait Distance (Feet):  8 Feet (x2) Assistive device: Rolling walker (2 wheels) Gait Pattern/deviations: Step-through pattern, Decreased stride length, Wide base of support Gait velocity: decr     General Gait Details: ambulated to/from bathroom, denies dyspnea  Stairs            Wheelchair Mobility     Tilt Bed    Modified Rankin (Stroke Patients Only)       Balance Overall balance assessment: History of Falls                                           Pertinent Vitals/Pain Pain Assessment Pain Assessment: No/denies pain    Home Living Family/patient expects to be discharged to:: Private residence Living Arrangements: Alone   Type of Home: Apartment Home Access: Level entry       Home Layout: One level Home Equipment: Hospital bed;Tub bench;Rolling Walker (2 wheels);Rollator (4 wheels);BSC/3in1;Wheelchair - manual      Prior Function Prior Level of Function : History of Falls (last six months);Independent/Modified Independent             Mobility Comments: Uses a RW in home       Extremity/Trunk Assessment        Lower Extremity Assessment Lower Extremity Assessment: Generalized weakness       Communication   Communication Communication: No apparent difficulties    Cognition Arousal: Alert Behavior During Therapy: WFL for tasks assessed/performed  PT - Cognitive impairments: No apparent impairments                         Following commands: Intact       Cueing       General Comments      Exercises     Assessment/Plan    PT Assessment Patient needs continued PT services  PT Problem List Decreased strength;Decreased activity tolerance;Decreased balance;Decreased mobility;Decreased knowledge of use of DME;Obesity       PT Treatment Interventions Gait training;DME instruction;Balance training;Functional mobility training;Therapeutic activities;Therapeutic exercise;Patient/family education    PT Goals (Current goals  can be found in the Care Plan section)  Acute Rehab PT Goals PT Goal Formulation: With patient Time For Goal Achievement: 05/11/24 Potential to Achieve Goals: Good    Frequency Min 2X/week     Co-evaluation               AM-PAC PT "6 Clicks" Mobility  Outcome Measure Help needed turning from your back to your side while in a flat bed without using bedrails?: A Little Help needed moving from lying on your back to sitting on the side of a flat bed without using bedrails?: A Little Help needed moving to and from a bed to a chair (including a wheelchair)?: A Little Help needed standing up from a chair using your arms (e.g., wheelchair or bedside chair)?: A Little Help needed to walk in hospital room?: A Little Help needed climbing 3-5 steps with a railing? : A Lot 6 Click Score: 17    End of Session Equipment Utilized During Treatment: Gait belt Activity Tolerance: Patient tolerated treatment well Patient left: in chair;with call bell/phone within reach;with chair alarm set   PT Visit Diagnosis: Difficulty in walking, not elsewhere classified (R26.2)    Time: 2956-2130 PT Time Calculation (min) (ACUTE ONLY): 11 min   Charges:   PT Evaluation $PT Eval Low Complexity: 1 Low   PT General Charges $$ ACUTE PT VISIT: 1 Visit        Henretta Lodge PT, DPT Physical Therapist Acute Rehabilitation Services Office: [phone]   Myna Asal Payson 04/27/2024, 2:18 PM

## 2024-04-27 NOTE — Plan of Care (Signed)
  Problem: Coping: Goal: Ability to adjust to condition or change in health will improve Outcome: Progressing   Problem: Metabolic: Goal: Ability to maintain appropriate glucose levels will improve Outcome: Progressing   Problem: Education: Goal: Knowledge of General Education information will improve Description: Including pain rating scale, medication(s)/side effects and non-pharmacologic comfort measures Outcome: Progressing   Problem: Pain Managment: Goal: General experience of comfort will improve and/or be controlled Outcome: Progressing

## 2024-04-28 ENCOUNTER — Inpatient Hospital Stay (HOSPITAL_COMMUNITY)

## 2024-04-28 DIAGNOSIS — K429 Umbilical hernia without obstruction or gangrene: Secondary | ICD-10-CM

## 2024-04-28 DIAGNOSIS — M7989 Other specified soft tissue disorders: Secondary | ICD-10-CM | POA: Diagnosis not present

## 2024-04-28 DIAGNOSIS — R0602 Shortness of breath: Secondary | ICD-10-CM | POA: Diagnosis not present

## 2024-04-28 LAB — GLUCOSE, CAPILLARY
Glucose-Capillary: 96 mg/dL (ref 70–99)
Glucose-Capillary: 100 mg/dL — ABNORMAL HIGH (ref 70–99)
Glucose-Capillary: 60 mg/dL — ABNORMAL LOW (ref 70–99)
Glucose-Capillary: 87 mg/dL (ref 70–99)
Glucose-Capillary: 127 mg/dL — ABNORMAL HIGH (ref 70–99)

## 2024-04-28 LAB — CBC
HCT: 34.6 % — ABNORMAL LOW (ref 36.0–46.0)
Hemoglobin: 10.6 g/dL — ABNORMAL LOW (ref 12.0–15.0)
MCH: 29 pg (ref 26.0–34.0)
MCHC: 30.6 g/dL (ref 30.0–36.0)
MCV: 94.5 fL (ref 80.0–100.0)
Platelets: 246 10*3/uL (ref 150–400)
RBC: 3.66 MIL/uL — ABNORMAL LOW (ref 3.87–5.11)
RDW: 14.1 % (ref 11.5–15.5)
WBC: 7.7 10*3/uL (ref 4.0–10.5)
nRBC: 0 % (ref 0.0–0.2)

## 2024-04-28 LAB — COMPREHENSIVE METABOLIC PANEL WITH GFR
ALT: 10 U/L (ref 0–44)
AST: 19 U/L (ref 15–41)
Albumin: 3.1 g/dL — ABNORMAL LOW (ref 3.5–5.0)
Alkaline Phosphatase: 64 U/L (ref 38–126)
Anion gap: 7 (ref 5–15)
BUN: 14 mg/dL (ref 8–23)
CO2: 31 mmol/L (ref 22–32)
Calcium: 8.3 mg/dL — ABNORMAL LOW (ref 8.9–10.3)
Chloride: 101 mmol/L (ref 98–111)
Creatinine, Ser: 1.23 mg/dL — ABNORMAL HIGH (ref 0.44–1.00)
GFR, Estimated: 48 mL/min — ABNORMAL LOW (ref >60)
Glucose, Bld: 102 mg/dL — ABNORMAL HIGH (ref 70–99)
Potassium: 3.6 mmol/L (ref 3.5–5.1)
Sodium: 139 mmol/L (ref 135–145)
Total Bilirubin: 0.6 mg/dL (ref 0.0–1.2)
Total Protein: 6 g/dL — ABNORMAL LOW (ref 6.5–8.1)

## 2024-04-28 MED ORDER — IOHEXOL 300 MG/ML  SOLN
100.0000 mL | Freq: Once | INTRAMUSCULAR | Status: AC | PRN
  Administered 2024-04-28: 100 mL via INTRAVENOUS

## 2024-04-28 MED ORDER — MELATONIN 3 MG PO TABS
3.0000 mg | ORAL_TABLET | Freq: Every evening | ORAL | Status: DC | PRN
  Administered 2024-04-28 – 2024-05-02 (×5): 3 mg via ORAL
  Filled 2024-04-28 (×5): qty 1

## 2024-04-28 MED ORDER — IOHEXOL 9 MG/ML PO SOLN
500.0000 mL | ORAL | Status: AC
  Administered 2024-04-28 (×2): 500 mL via ORAL

## 2024-04-28 MED ORDER — IOHEXOL 9 MG/ML PO SOLN
ORAL | Status: AC
  Filled 2024-04-28: qty 1000

## 2024-04-28 MED ORDER — PHENOL 1.4 % MT LIQD
1.0000 | OROMUCOSAL | Status: DC | PRN
  Administered 2024-04-28: 1 via OROMUCOSAL
  Filled 2024-04-28: qty 177

## 2024-04-28 MED ORDER — SODIUM CHLORIDE (PF) 0.9 % IJ SOLN
INTRAMUSCULAR | Status: AC
  Filled 2024-04-28: qty 50

## 2024-04-28 NOTE — Progress Notes (Signed)
 [facility]  PROGRESS NOTE  Maria [name] Dix ZOX:[id number] DOB: October 09, 1956 DOA: 04/26/2024 PCP: Sharma Dears, NP   Brief HPI:   68 y.o. female with PMH significant for morbid obesity, DM2, HTN, HLD, chronic diastolic CHF, chronic bilateral lower EXTR lymphedema, PAD, anxiety/depression, lumbar disc disease, chronic pain on oxycodone , impaired mobility.  Lives at home alone.  Uses a walker to get around. Reports compliance to torsemide  BID. For the last several days, patient has noted progressive worsening of swelling and weeping of lower extremities.  No fever.    Assessment/Plan:   Acute on chronic cellulitis of bilateral lower extremity Bilateral lower extremity lymphedema Presented with progressive worsening of swelling and weeping of lower extremities. Evidence of cellulitis R>L No open wound or suspicion of abscess No fever, WC count normal Start on IV Ancef  Given IV Lasix  in the ED. started on Lasix  40 mg IV twice daily; will hold further doses of Lasix .  Ace wrapping, leg elevation Outpatient follow-up at lymphedema clinic would be helpful   Chronic diastolic and right-sided CHF HTN Most recent Echo from Feb 2024 showed ED 65-70%, G2DD, moderately elevated PASP to 47. PTA meds- Toprol , torsemide  twice daily, Aldactone  Resume Toprol .  Patient was started on Lasix  40 mg IV twice daily.  Will hold further doses of Lasix . Check BNP  Umbilical hernia - Patient has erythema and tenderness at the umbilicus -Denies abdominal pain - CT abdomen/pelvis with contrast ordered to rule out incarcerated umbilical hernia  Fungal panniculitis Nystatin  powder order   Type 2 diabetes mellitus A1c 5.1 on February 2025 PTA meds-on Mounjaro every Wednesday Started on SSI/Accu-Cheks      Morbid Obesity  Body mass index is 60.14 kg/m. Patient has been advised to make an attempt to improve diet and exercise patterns to aid in weight loss.   Chronic pain Chronic lumbar  disc disease Reports taking oxycodone  10 mg every 8 hours Ordered.   Impaired mobility PT eval   Anxiety/depression Klonopin  as needed,    Medications     enoxaparin  (LOVENOX ) injection  80 mg Subcutaneous Q24H   insulin  aspart  0-5 Units Subcutaneous QHS   insulin  aspart  0-9 Units Subcutaneous TID WC   metoprolol  succinate  25 mg Oral Daily   nystatin    Topical BID   pantoprazole   40 mg Oral Daily   pramipexole   0.5 mg Oral QHS     Data Reviewed:   CBG:  Recent Labs  Lab 04/27/24 0735 04/27/24 1110 04/27/24 1614 04/27/24 2112 04/28/24 0736  GLUCAP 89 96 101* 94 96    SpO2: 98 %    Vitals:   04/27/24 1023 04/27/24 1419 04/27/24 2007 04/28/24 0500  BP: (!) 146/54 (!) 149/59 (!) 154/53 (!) 140/65  Pulse: 74 73 77 71  Resp: 17 16 16    Temp: 97.8 F (36.6 C) 98.2 F (36.8 C) 98.7 F (37.1 C)   TempSrc: Oral Oral Oral Oral  SpO2: 97% 96% 94% 98%  Weight:      Height:          Data Reviewed:  Basic Metabolic Panel: Recent Labs  Lab 04/26/24 1556 04/27/24 0413 04/27/24 1535 04/28/24 0354  NA 139 139  --  139  K 3.7 2.9* 4.2 3.6  CL 105 100  --  101  CO2 24 31  --  31  GLUCOSE 99 92  --  102*  BUN 16 15  --  14  CREATININE 1.12* 1.17*  --  1.23*  CALCIUM   8.7* 8.4*  --  8.3*  MG  --  2.2  --   --     CBC: Recent Labs  Lab 04/26/24 1556 04/27/24 0412 04/28/24 0354  WBC 10.2 8.9 7.7  NEUTROABS 6.2  --   --   HGB 12.1 10.6* 10.6*  HCT 38.7 33.8* 34.6*  MCV 93.7 93.4 94.5  PLT 311 243 246    LFT Recent Labs  Lab 04/26/24 1556 04/28/24 0354  AST 17 19  ALT 12 10  ALKPHOS 88 64  BILITOT 1.0 0.6  PROT 7.4 6.0*  ALBUMIN 4.0 3.1*     Antibiotics: Anti-infectives (From admission, onward)    Start     Dose/Rate Route Frequency Ordered Stop   04/26/24 2200  ceFAZolin  (ANCEF ) IVPB 2g/100 mL premix        2 g 200 mL/hr over 30 Minutes Intravenous Every 8 hours 04/26/24 1853 05/03/24 2159   04/26/24 1615  fluconazole  (DIFLUCAN )  tablet 150 mg        150 mg Oral  Once 04/26/24 1609 04/26/24 1641        DVT prophylaxis: Lovenox   Code Status: Full code  Family Communication:    CONSULTS    Subjective   Complains of abdominal pain today   Objective    Physical Examination:  General-appears in no acute distress Heart-S1-S2, regular, no murmur auscultated Lungs-clear to auscultation bilaterally, no wheezing or crackles auscultated Abdomen-soft, mild periumbilical tenderness, protruding umbilicus Extremities-no edema in the lower extremities Neuro-alert, oriented x3, no focal deficit noted   Status is: Inpatient:             Ozell Blunt   [facility] If 7PM-7AM, please contact night-coverage at [email or web address], Office  [phone]   04/28/2024, 8:30 AM  LOS: 2 days

## 2024-04-28 NOTE — Progress Notes (Signed)
 Physical Therapy Treatment Patient Details Name: Maria [name] MRN: [id number] DOB: 12-Dec-1956 Today's Date: 04/28/2024   History of Present Illness 68 y.o. female admitted 04/26/24 with Acute on chronic cellulitis of bilateral lower extremity,  Bilateral lower extremity lymphedema, and Chronic diastolic and right-sided CHF.   PMH significant for morbid obesity, DM2, HTN, HLD, chronic diastolic CHF, chronic bilateral lower EXTR lymphedema, PAD, anxiety/depression, lumbar disc disease, chronic pain on oxycodone , impaired mobility.    PT Comments  Patient reports more discomfort in legs, has been premedicated. Patient did  mobilize to stand and step along the bed. Patient had been uip and had a CT so reports fatigue. Continue PT.     If plan is discharge home, recommend the following: A little help with walking and/or transfers;A little help with bathing/dressing/bathroom;Help with stairs or ramp for entrance   Can travel by private vehicle        Equipment Recommendations  None recommended by PT    Recommendations for Other Services       Precautions / Restrictions Precautions Precautions: Fall Restrictions Weight Bearing Restrictions Per Provider Order: No     Mobility  Bed Mobility   Bed Mobility: Supine to Sit, Sit to Supine     Supine to sit: Contact guard Sit to supine: Min assist   General bed mobility comments: assist legs onto bed    Transfers Overall transfer level: Needs assistance Equipment used: Rolling walker (2 wheels) Transfers: Sit to/from Stand Sit to Stand: Min assist, From elevated surface           General transfer comment: assist to rise and stabilize, cues for hand placement    Ambulation/Gait Ambulation/Gait assistance: Min assist   Assistive device: Rolling walker (2 wheels) Gait Pattern/deviations: Wide base of support, Step-to pattern Gait velocity: decr     General Gait Details: side stepped along the bed.   Stairs              Wheelchair Mobility     Tilt Bed    Modified Rankin (Stroke Patients Only)       Balance Overall balance assessment: History of Falls, Needs assistance Sitting-balance support: Bilateral upper extremity supported, Feet supported Sitting balance-Leahy Scale: Good     Standing balance support: Bilateral upper extremity supported, During functional activity, Reliant on assistive device for balance Standing balance-Leahy Scale: Fair                              Hotel manager: No apparent difficulties  Cognition Arousal: Alert Behavior During Therapy: WFL for tasks assessed/performed   PT - Cognitive impairments: No apparent impairments                         Following commands: Intact      Cueing    Exercises      General Comments        Pertinent Vitals/Pain Pain Assessment Pain Assessment: 0-10 Pain Score: 6  Pain Location: knees Pain Descriptors / Indicators: Discomfort Pain Intervention(s): Monitored during session, Premedicated before session    Home Living                          Prior Function            PT Goals (current goals can now be found in the care plan section) Progress towards PT goals: Progressing toward  goals    Frequency    Min 2X/week      PT Plan      Co-evaluation              AM-PAC PT "6 Clicks" Mobility   Outcome Measure  Help needed turning from your back to your side while in a flat bed without using bedrails?: A Little Help needed moving from lying on your back to sitting on the side of a flat bed without using bedrails?: A Little Help needed moving to and from a bed to a chair (including a wheelchair)?: A Little Help needed standing up from a chair using your arms (e.g., wheelchair or bedside chair)?: A Little Help needed to walk in hospital room?: A Lot Help needed climbing 3-5 steps with a railing? : A Lot 6 Click Score: 16    End  of Session   Activity Tolerance: Patient tolerated treatment well Patient left: in bed;with call bell/phone within reach;with bed alarm set Nurse Communication: Mobility status PT Visit Diagnosis: Difficulty in walking, not elsewhere classified (R26.2)     Time: 6213-0865 PT Time Calculation (min) (ACUTE ONLY): 21 min  Charges:    $Gait Training: 8-22 mins PT General Charges $$ ACUTE PT VISIT: 1 Visit                     Abelina Hoes PT Acute Rehabilitation Services Office [phone] Weekend [phone]    Dareen Ebbing 04/28/2024, 5:02 PM

## 2024-04-28 NOTE — Plan of Care (Signed)
   Problem: Education: Goal: Ability to describe self-care measures that may prevent or decrease complications (Diabetes Survival Skills Education) will improve Outcome: Progressing   Problem: Education: Goal: Individualized Educational Video(s) Outcome: Progressing   Problem: Coping: Goal: Ability to adjust to condition or change in health will improve Outcome: Progressing

## 2024-04-28 NOTE — Progress Notes (Addendum)
 Hypoglycemic Event  CBG: 60  Treatment: 4 oz juice/soda  Symptoms: None  Follow-up CBG: Time: 1813 CBG Result: 87  Possible Reasons for Event: Inadequate meal intake  Comments/MD notified: MD notified. Patient was asymptomatic. Patient was given 4 oz. of orange juice. Rechecked CBG and it was 87.   Alvera Jock, RN

## 2024-04-29 DIAGNOSIS — K429 Umbilical hernia without obstruction or gangrene: Secondary | ICD-10-CM | POA: Diagnosis not present

## 2024-04-29 DIAGNOSIS — M7989 Other specified soft tissue disorders: Secondary | ICD-10-CM | POA: Diagnosis not present

## 2024-04-29 DIAGNOSIS — R0602 Shortness of breath: Secondary | ICD-10-CM | POA: Diagnosis not present

## 2024-04-29 LAB — BASIC METABOLIC PANEL WITH GFR
Anion gap: 7 (ref 5–15)
BUN: 11 mg/dL (ref 8–23)
CO2: 30 mmol/L (ref 22–32)
Calcium: 8.4 mg/dL — ABNORMAL LOW (ref 8.9–10.3)
Chloride: 102 mmol/L (ref 98–111)
Creatinine, Ser: 1.06 mg/dL — ABNORMAL HIGH (ref 0.44–1.00)
GFR, Estimated: 58 mL/min — ABNORMAL LOW (ref >60)
Glucose, Bld: 115 mg/dL — ABNORMAL HIGH (ref 70–99)
Potassium: 4 mmol/L (ref 3.5–5.1)
Sodium: 139 mmol/L (ref 135–145)

## 2024-04-29 LAB — GLUCOSE, CAPILLARY
Glucose-Capillary: 114 mg/dL — ABNORMAL HIGH (ref 70–99)
Glucose-Capillary: 114 mg/dL — ABNORMAL HIGH (ref 70–99)
Glucose-Capillary: 103 mg/dL — ABNORMAL HIGH (ref 70–99)
Glucose-Capillary: 118 mg/dL — ABNORMAL HIGH (ref 70–99)

## 2024-04-29 MED ORDER — CEFAZOLIN SODIUM-DEXTROSE 2-4 GM/100ML-% IV SOLN
2.0000 g | Freq: Three times a day (TID) | INTRAVENOUS | Status: DC
  Administered 2024-04-29 – 2024-05-02 (×9): 2 g via INTRAVENOUS
  Filled 2024-04-29 (×10): qty 100

## 2024-04-29 MED ORDER — ALBUMIN HUMAN 25 % IV SOLN
12.5000 g | Freq: Once | INTRAVENOUS | Status: AC
  Administered 2024-04-29: 12.5 g via INTRAVENOUS
  Filled 2024-04-29: qty 50

## 2024-04-29 MED ORDER — FUROSEMIDE 10 MG/ML IJ SOLN
40.0000 mg | Freq: Once | INTRAMUSCULAR | Status: AC
  Administered 2024-04-29: 40 mg via INTRAVENOUS
  Filled 2024-04-29: qty 4

## 2024-04-29 MED ORDER — POTASSIUM CHLORIDE 20 MEQ PO PACK
20.0000 meq | PACK | Freq: Once | ORAL | Status: AC
  Administered 2024-04-29: 20 meq via ORAL
  Filled 2024-04-29: qty 1

## 2024-04-29 NOTE — Progress Notes (Signed)
**   Late Entry **  6 am dose of Ancef  was scanned but not infused due to the patient losing IV access. IV placed by IV team and Ancef  dose was rescheduled per pharmacy

## 2024-04-29 NOTE — Progress Notes (Signed)
 [facility]  PROGRESS NOTE  Maria [name] Charo JWJ:[id number] DOB: 1956/05/17 DOA: 04/26/2024 PCP: Sharma Dears, NP   Brief HPI:   68 y.o. female with PMH significant for morbid obesity, DM2, HTN, HLD, chronic diastolic CHF, chronic bilateral lower EXTR lymphedema, PAD, anxiety/depression, lumbar disc disease, chronic pain on oxycodone , impaired mobility.  Lives at home alone.  Uses a walker to get around. Reports compliance to torsemide  BID. For the last several days, patient has noted progressive worsening of swelling and weeping of lower extremities.  No fever.    Assessment/Plan:   Acute on chronic cellulitis of bilateral lower extremity Bilateral lower extremity lymphedema Presented with progressive worsening of swelling and weeping of lower extremities. Evidence of cellulitis R>L No open wound or suspicion of abscess No fever, WC count normal Start on IV Ancef  Given IV Lasix  in the ED. started on Lasix  40 mg IV twice daily; will hold further doses of Lasix .  Ace wrapping, leg elevation Outpatient follow-up at lymphedema clinic would be helpful -Will give Lasix  40 now IV, albumin 25% 12.5 g x 1   Chronic diastolic and right-sided CHF HTN Most recent Echo from Feb 2024 showed ED 65-70%, G2DD, moderately elevated PASP to 47. PTA meds- Toprol , torsemide  twice daily, Aldactone  Resume Toprol .  Patient was started on Lasix  40 mg IV twice daily.  Will hold further doses of Lasix . BNP 52.8 - Started on Lasix  as above  Umbilical hernia - Patient has erythema and tenderness at the umbilicus -Denies abdominal pain - CT abdomen/pelvis with contrast done, showed moderate size umbilical hernia with fat only, no bowel involvement or inflammation  Fungal panniculitis Nystatin  powder order   Type 2 diabetes mellitus A1c 5.1 on February 2025 PTA meds-on Mounjaro every Wednesday Started on SSI/Accu-Cheks    Morbid Obesity  Body mass index is 60.14 kg/m. Patient has been  advised to make an attempt to improve diet and exercise patterns to aid in weight loss.   Chronic pain Chronic lumbar disc disease Reports taking oxycodone  10 mg every 8 hours Ordered.   Impaired mobility PT eval   Anxiety/depression Klonopin  as needed,    Medications     enoxaparin  (LOVENOX ) injection  80 mg Subcutaneous Q24H   insulin  aspart  0-5 Units Subcutaneous QHS   insulin  aspart  0-9 Units Subcutaneous TID WC   metoprolol  succinate  25 mg Oral Daily   nystatin    Topical BID   pantoprazole   40 mg Oral Daily   pramipexole   0.5 mg Oral QHS     Data Reviewed:   CBG:  Recent Labs  Lab 04/28/24 1128 04/28/24 1737 04/28/24 1813 04/28/24 2110 04/29/24 0736  GLUCAP 100* 60* 87 127* 114*    SpO2: 96 %    Vitals:   04/28/24 0901 04/28/24 1137 04/28/24 2109 04/29/24 0602  BP: (!) 157/62 (!) 149/49 (!) 138/53 (!) 126/47  Pulse: 70 68 72 66  Resp:  18 18 18   Temp:  98.2 F (36.8 C) 98.4 F (36.9 C) 98.1 F (36.7 C)  TempSrc:  Oral Oral Oral  SpO2:  97% 98% 96%  Weight:      Height:          Data Reviewed:  Basic Metabolic Panel: Recent Labs  Lab 04/26/24 1556 04/27/24 0413 04/27/24 1535 04/28/24 0354 04/29/24 0417  NA 139 139  --  139 139  K 3.7 2.9* 4.2 3.6 4.0  CL 105 100  --  101 102  CO2 24 31  --  31 30  GLUCOSE 99 92  --  102* 115*  BUN 16 15  --  14 11  CREATININE 1.12* 1.17*  --  1.23* 1.06*  CALCIUM  8.7* 8.4*  --  8.3* 8.4*  MG  --  2.2  --   --   --     CBC: Recent Labs  Lab 04/26/24 1556 04/27/24 0412 04/28/24 0354  WBC 10.2 8.9 7.7  NEUTROABS 6.2  --   --   HGB 12.1 10.6* 10.6*  HCT 38.7 33.8* 34.6*  MCV 93.7 93.4 94.5  PLT 311 243 246    LFT Recent Labs  Lab 04/26/24 1556 04/28/24 0354  AST 17 19  ALT 12 10  ALKPHOS 88 64  BILITOT 1.0 0.6  PROT 7.4 6.0*  ALBUMIN 4.0 3.1*     Antibiotics: Anti-infectives (From admission, onward)    Start     Dose/Rate Route Frequency Ordered Stop   04/26/24 2200   ceFAZolin  (ANCEF ) IVPB 2g/100 mL premix        2 g 200 mL/hr over 30 Minutes Intravenous Every 8 hours 04/26/24 1853 05/03/24 2159   04/26/24 1615  fluconazole  (DIFLUCAN ) tablet 150 mg        150 mg Oral  Once 04/26/24 1609 04/26/24 1641        DVT prophylaxis: Lovenox   Code Status: Full code  Family Communication:    CONSULTS    Subjective   Denies abdominal pain.  Leg swelling is improving   Objective    Physical Examination:  General-appears in no acute distress Heart-S1-S2, regular, no murmur auscultated Lungs-clear to auscultation bilaterally, no wheezing or crackles auscultated Abdomen-soft, nontender, no organomegaly Extremities-2+ edema in the lower extremities, lower extremities and Ace wrapped Neuro-alert, oriented x3, no focal deficit noted   Status is: Inpatient:             Maria [name]   [facility] If 7PM-7AM, please contact night-coverage at [email or web address], Office  [phone]   04/29/2024, 8:36 AM  LOS: 3 days

## 2024-04-30 DIAGNOSIS — R0602 Shortness of breath: Secondary | ICD-10-CM | POA: Diagnosis not present

## 2024-04-30 DIAGNOSIS — M7989 Other specified soft tissue disorders: Secondary | ICD-10-CM | POA: Diagnosis not present

## 2024-04-30 DIAGNOSIS — K429 Umbilical hernia without obstruction or gangrene: Secondary | ICD-10-CM | POA: Diagnosis not present

## 2024-04-30 LAB — COMPREHENSIVE METABOLIC PANEL WITH GFR
ALT: 7 U/L (ref 0–44)
AST: 15 U/L (ref 15–41)
Albumin: 3.1 g/dL — ABNORMAL LOW (ref 3.5–5.0)
Alkaline Phosphatase: 56 U/L (ref 38–126)
Anion gap: 9 (ref 5–15)
BUN: 11 mg/dL (ref 8–23)
CO2: 28 mmol/L (ref 22–32)
Calcium: 8.5 mg/dL — ABNORMAL LOW (ref 8.9–10.3)
Chloride: 102 mmol/L (ref 98–111)
Creatinine, Ser: 0.94 mg/dL (ref 0.44–1.00)
GFR, Estimated: >60 mL/min (ref >60)
Glucose, Bld: 104 mg/dL — ABNORMAL HIGH (ref 70–99)
Potassium: 4.1 mmol/L (ref 3.5–5.1)
Sodium: 139 mmol/L (ref 135–145)
Total Bilirubin: 0.8 mg/dL (ref 0.0–1.2)
Total Protein: 6.1 g/dL — ABNORMAL LOW (ref 6.5–8.1)

## 2024-04-30 LAB — GLUCOSE, CAPILLARY
Glucose-Capillary: 144 mg/dL — ABNORMAL HIGH (ref 70–99)
Glucose-Capillary: 99 mg/dL (ref 70–99)
Glucose-Capillary: 122 mg/dL — ABNORMAL HIGH (ref 70–99)
Glucose-Capillary: 118 mg/dL — ABNORMAL HIGH (ref 70–99)

## 2024-04-30 MED ORDER — POTASSIUM CHLORIDE CRYS ER 20 MEQ PO TBCR
20.0000 meq | EXTENDED_RELEASE_TABLET | Freq: Once | ORAL | Status: AC
  Administered 2024-04-30: 20 meq via ORAL
  Filled 2024-04-30: qty 1

## 2024-04-30 MED ORDER — ALBUMIN HUMAN 25 % IV SOLN
12.5000 g | Freq: Once | INTRAVENOUS | Status: AC
  Administered 2024-04-30: 12.5 g via INTRAVENOUS
  Filled 2024-04-30: qty 50

## 2024-04-30 MED ORDER — FUROSEMIDE 10 MG/ML IJ SOLN
40.0000 mg | Freq: Once | INTRAMUSCULAR | Status: AC
  Administered 2024-04-30: 40 mg via INTRAVENOUS
  Filled 2024-04-30: qty 4

## 2024-04-30 NOTE — Progress Notes (Signed)
 [facility]  PROGRESS NOTE  Maria [name] JXB:[id number] DOB: 05/31/1956 DOA: 04/26/2024 PCP: Sharma Dears, NP   Brief HPI:   68 y.o. female with PMH significant for morbid obesity, DM2, HTN, HLD, chronic diastolic CHF, chronic bilateral lower EXTR lymphedema, PAD, anxiety/depression, lumbar disc disease, chronic pain on oxycodone , impaired mobility.  Lives at home alone.  Uses a walker to get around. Reports compliance to torsemide  BID. For the last several days, patient has noted progressive worsening of swelling and weeping of lower extremities.  No fever.    Assessment/Plan:   Acute on chronic cellulitis of bilateral lower extremity Bilateral lower extremity lymphedema Presented with progressive worsening of swelling and weeping of lower extremities. Evidence of cellulitis R>L No open wound or suspicion of abscess No fever, WC count normal Start on IV Ancef  -Diuresed well with IV Lasix , IV albumin  Ace wrapping, leg elevation Outpatient follow-up at lymphedema clinic would be helpful -Will repeat  Lasix  40 now IV, albumin 25% 12.5 g x 1   Chronic diastolic and right-sided CHF HTN Most recent Echo from Feb 2024 showed ED 65-70%, G2DD, moderately elevated PASP to 47. PTA meds- Toprol , torsemide  twice daily, Aldactone  Resume Toprol .  Patient was started on Lasix  40 mg IV twice daily.  Will hold further doses of Lasix . BNP 52.8 - Started on Lasix  as above  Umbilical hernia - Patient has erythema and tenderness at the umbilicus -Denies abdominal pain - CT abdomen/pelvis with contrast done, showed moderate size umbilical hernia with fat only, no bowel involvement or inflammation  Fungal panniculitis Nystatin  powder order   Type 2 diabetes mellitus A1c 5.1 on February 2025 PTA meds-on Mounjaro every Wednesday Started on SSI/Accu-Cheks    Morbid Obesity  Body mass index is 60.14 kg/m. Patient has been advised to make an attempt to improve diet and exercise  patterns to aid in weight loss.   Chronic pain Chronic lumbar disc disease Reports taking oxycodone  10 mg every 8 hours Ordered.   Impaired mobility PT eval   Anxiety/depression Klonopin  as needed,    Medications     enoxaparin  (LOVENOX ) injection  80 mg Subcutaneous Q24H   insulin  aspart  0-5 Units Subcutaneous QHS   insulin  aspart  0-9 Units Subcutaneous TID WC   metoprolol  succinate  25 mg Oral Daily   nystatin    Topical BID   pantoprazole   40 mg Oral Daily   pramipexole   0.5 mg Oral QHS     Data Reviewed:   CBG:  Recent Labs  Lab 04/29/24 0736 04/29/24 1211 04/29/24 1635 04/29/24 1948 04/30/24 0803  GLUCAP 114* 114* 103* 118* 144*    SpO2: 98 %    Vitals:   04/29/24 1212 04/29/24 1951 04/29/24 1953 04/30/24 0437  BP: (!) 146/64 (!) 189/78 (!) 146/46 136/66  Pulse: 62 70 69 71  Resp: 17     Temp: 98 F (36.7 C) 98.4 F (36.9 C)  99.1 F (37.3 C)  TempSrc: Oral Oral  Oral  SpO2: 100% 100%  98%  Weight:      Height:          Data Reviewed:  Basic Metabolic Panel: Recent Labs  Lab 04/26/24 1556 04/27/24 0413 04/27/24 1535 04/28/24 0354 04/29/24 0417 04/30/24 0429  NA 139 139  --  139 139 139  K 3.7 2.9* 4.2 3.6 4.0 4.1  CL 105 100  --  101 102 102  CO2 24 31  --  31 30 28   GLUCOSE 99 92  --  102* 115* 104*  BUN 16 15  --  14 11 11   CREATININE 1.12* 1.17*  --  1.23* 1.06* 0.94  CALCIUM  8.7* 8.4*  --  8.3* 8.4* 8.5*  MG  --  2.2  --   --   --   --     CBC: Recent Labs  Lab 04/26/24 1556 04/27/24 0412 04/28/24 0354  WBC 10.2 8.9 7.7  NEUTROABS 6.2  --   --   HGB 12.1 10.6* 10.6*  HCT 38.7 33.8* 34.6*  MCV 93.7 93.4 94.5  PLT 311 243 246    LFT Recent Labs  Lab 04/26/24 1556 04/28/24 0354 04/30/24 0429  AST 17 19 15   ALT 12 10 7   ALKPHOS 88 64 56  BILITOT 1.0 0.6 0.8  PROT 7.4 6.0* 6.1*  ALBUMIN 4.0 3.1* 3.1*     Antibiotics: Anti-infectives (From admission, onward)    Start     Dose/Rate Route Frequency  Ordered Stop   04/29/24 1000  ceFAZolin  (ANCEF ) IVPB 2g/100 mL premix        2 g 200 mL/hr over 30 Minutes Intravenous Every 8 hours 04/29/24 0950 05/03/24 1759   04/26/24 2200  ceFAZolin  (ANCEF ) IVPB 2g/100 mL premix  Status:  Discontinued        2 g 200 mL/hr over 30 Minutes Intravenous Every 8 hours 04/26/24 1853 04/29/24 0950   04/26/24 1615  fluconazole  (DIFLUCAN ) tablet 150 mg        150 mg Oral  Once 04/26/24 1609 04/26/24 1641        DVT prophylaxis: Lovenox   Code Status: Full code  Family Communication:    CONSULTS    Subjective   Denies shortness of breath.  Leg swelling has improved.  Diuresing well with IV Lasix .   Objective    Physical Examination:  General-appears in no acute distress Heart-S1-S2, regular, no murmur auscultated Lungs-clear to auscultation bilaterally, no wheezing or crackles auscultated Abdomen-soft, nontender, no organomegaly Extremities-bilateral lower extremities in Ace wrapping, 2+ edema in the lower extremities Neuro-alert, oriented x3, no focal deficit noted   Status is: Inpatient:             Maria [name]   [facility] If 7PM-7AM, please contact night-coverage at [email or web address], Office  [phone]   04/30/2024, 8:05 AM  LOS: 4 days

## 2024-04-30 NOTE — Plan of Care (Signed)
  Problem: Coping: Goal: Ability to adjust to condition or change in health will improve Outcome: Progressing   Problem: Metabolic: Goal: Ability to maintain appropriate glucose levels will improve Outcome: Progressing   Problem: Clinical Measurements: Goal: Diagnostic test results will improve Outcome: Progressing   Problem: Nutrition: Goal: Adequate nutrition will be maintained Outcome: Progressing   Problem: Coping: Goal: Level of anxiety will decrease Outcome: Progressing   Problem: Pain Managment: Goal: General experience of comfort will improve and/or be controlled Outcome: Progressing

## 2024-04-30 NOTE — TOC Initial Note (Signed)
 Transition of Care [facility]) - Initial/Assessment Note    Patient Details  Name: Maria [name] MRN: [id number] Date of Birth: 1956/03/09  [facility]) CM/SW Contact:    Katrine Parody, LCSW Phone Number: 04/30/2024, 4:38 PM  Clinical Narrative:                 Pt is high risk for readmission. CSW completed assessment with pt at bedside.  Pt lives in an apartment -just moved in same complex to a handicap accessible unit. Pt lives alone, is mostly independent in completing her ADLs. She is able to use wc in home to cook and ambulate in kitchen and bathroom.  She can also ambulate with a walker when needed.  Pt uses a Radio broadcast assistant arranged via her insurance- to medical appointments. Pt has a cane, wc- had a bsc but said it is no longer operable. Pt inquired about a HH Aide as well. Gasper Karst had an opening for HHPT and accepted. TOC to follow.    Expected Discharge Plan: Home w Home Health Services Barriers to Discharge: Continued Medical Work up   Patient Goals and CMS Choice Patient states their goals for this hospitalization and ongoing recovery are:: Return home- new aprtment handicap accessible.          Expected Discharge Plan and Services In-house Referral: Clinical Social Work   Post Acute Care Choice: Home Health Living arrangements for the past 2 months: Apartment                           HH Arranged: PT HH Agency: [facility] Date [facility] Agency Contacted: 04/30/24 Time HH Agency Contacted: 1606 Representative spoke with at [facility] Agency: Alease Hunter  Prior Living Arrangements/Services Living arrangements for the past 2 months: Apartment Lives with:: Self Patient language and need for interpreter reviewed:: Yes Do you feel safe going back to the place where you live?: Yes      Need for Family Participation in Patient Care: Yes (Comment) Care giver support system in place?: Yes (comment) (Has a friend to assist.)   Criminal Activity/Legal Involvement  Pertinent to Current Situation/Hospitalization: No - Comment as needed  Activities of Daily Living   ADL Screening (condition at time of admission) Independently performs ADLs?: Yes (appropriate for developmental age) Is the patient deaf or have difficulty hearing?: No Does the patient have difficulty seeing, even when wearing glasses/contacts?: Yes Does the patient have difficulty concentrating, remembering, or making decisions?: Yes  Permission Sought/Granted                  Emotional Assessment Appearance:: Appears stated age Attitude/Demeanor/Rapport: Gracious Affect (typically observed): Pleasant Orientation: : Oriented to Self, Oriented to Place, Oriented to  Time, Oriented to Situation   Psych Involvement: No (comment)  Admission diagnosis:  Shortness of breath [R06.02] Leg swelling [M79.89] CHF exacerbation (HCC) [I50.9] Patient Active Problem List   Diagnosis Date Noted   CHF exacerbation (HCC) 04/26/2024   Generalized weakness 01/31/2024   Hypoglycemia 01/31/2024   Anemia of chronic disease 01/31/2024   Pressure injury of skin 11/26/2023   Hallucinations 09/02/2023   Stasis dermatitis of both legs 08/30/2023   Hypomagnesemia 08/02/2023   Ankle pain, right 08/01/2023   Weakness of both lower extremities 07/30/2023   Morbid obesity (HCC) 07/30/2023   (HFpEF) heart failure with preserved ejection fraction (HCC) 07/15/2023   Abdominal pain 07/15/2023   Acute renal failure superimposed on stage 3a  chronic kidney disease (HCC) 07/15/2023   Acute on chronic respiratory failure with hypoxia and hypercapnia (HCC) 02/08/2023   Pneumonia due to infectious organism 02/05/2023   Chronic right-sided congestive heart failure (HCC) 02/04/2023   Fall 02/02/2023   Physical deconditioning 07/22/2022   Fluid overload 07/21/2022   Acute cystitis    Dyspnea 12/19/2021   Leg swelling 11/20/2021   Bilateral knee pain 10/29/2021   Hypernatremia 09/24/2021   Cellulitis of  lower extremity 09/24/2021   CKD (chronic kidney disease), stage IIIa (HCC) 09/24/2021   Sacral decubitus ulcer, stage II (HCC) 09/20/2021   Acute on chronic heart failure with preserved ejection fraction (HFpEF) (HCC) 09/19/2021   Nail disorder 07/07/2021   Pre-ulcerative corn or callous 07/07/2021   Rash 07/07/2021   Chronic diastolic CHF (congestive heart failure) (HCC) 03/17/2021   DM2 (diabetes mellitus, type 2) (HCC) 03/11/2021   Vitamin D  deficiency 01/01/2021   Medial epicondylitis 12/31/2020   Aortic atherosclerosis (HCC) 09/26/2020   Umbilical hernia 09/26/2020   Constipation 08/05/2020   Arthritis 06/12/2020   Hoarseness 05/01/2020   B12 deficiency 01/31/2020   Rosacea 01/31/2020   Neck swelling 01/31/2020   History of colonic polyps    Benign neoplasm of [place]    Nausea 08/31/2019   Throat pain 07/26/2019   HLD (hyperlipidemia) 05/24/2019   Leg cramps 05/24/2019   Lump in neck 05/24/2019   Left thyroid  nodule 01/25/2019   Jerking movements of extremities 07/20/2018   Balance disorder 07/20/2018   Right knee pain 03/21/2018   OSA (obstructive sleep apnea) 03/21/2018   Oxygen desaturation 03/21/2018   Urinary symptom or sign 03/21/2018   Lymphedema 01/25/2018   Gait disorder 01/25/2018   Dysphonia 12/07/2017   Encounter for well adult exam with abnormal findings 11/08/2017   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   Yasin polyp 08/11/2016   Abdominal pain, epigastric    Dysphagia    [place] cancer screening    ACE-inhibitor cough 12/05/2015   Restless legs syndrome 10/08/2015   Venous stasis dermatitis of both lower extremities 04/26/2015   GERD without esophagitis 10/17/2014   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/17/2014   AR (allergic rhinitis) 10/17/2014   Fatty liver 10/17/2014   Chronic pain syndrome 09/19/2014   Post-traumatic osteoarthritis of both knees 09/19/2014   Spinal stenosis 09/19/2014   Depression, major, single episode, complete remission  (HCC) 09/19/2014   Narcotic dependence (HCC) 09/19/2014   Acute hypoxic respiratory failure (HCC) 08/20/2014   False positive HIV serology 06/02/2013   Hypokalemia 06/29/2012   Spondylosis of 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 03/09/2012   Lumbar disc disease 03/09/2012   Morbid obesity with body mass index (BMI) of 50.0 to 59.9 in adult (HCC) 06/17/2011   Chronic low back pain 12/28/2008   GAD (generalized anxiety disorder) 12/06/2007   Depression 12/06/2007   Essential hypertension 12/06/2007   Gastroesophageal reflux disease without esophagitis 12/06/2007   LOW BACK PAIN 12/06/2007   Allergic rhinitis 08/10/2007   PCP:  Sharma Dears, NP Pharmacy:   [facility] - [place], [place] - [address] [place] [address] Phone: [phone] Fax: [phone]     Social Drivers of Health (SDOH) Social History: SDOH Screenings   Food Insecurity: No Food Insecurity (04/26/2024)  Housing: Low Risk  (04/26/2024)  Transportation Needs: No Transportation Needs (04/26/2024)  Utilities: Not At Risk (04/26/2024)  Alcohol  Screen: Low Risk  (12/30/2021)  Depression (PHQ2-9): Medium Risk (01/30/2022)  Financial Resource Strain: Medium Risk (12/30/2021)  Physical Activity: Inactive (12/30/2021)  Social Connections: Socially Isolated (04/26/2024)  Stress: No Stress Concern Present (12/30/2021)  Tobacco Use: Medium Risk (04/26/2024)   SDOH Interventions:     Readmission Risk Interventions    04/30/2024    4:03 PM 11/29/2023    1:16 PM 09/06/2023    1:10 PM  Readmission Risk Prevention Plan  Transportation Screening Complete Complete Complete  PCP or Specialist Appt within 3-5 Days   Complete  HRI or Home Care Consult   Complete  Social Work Consult for Recovery Care Planning/Counseling   Complete  Palliative Care Screening   Not Applicable  Medication Review Oceanographer) Complete Complete Complete  PCP or Specialist appointment  within 3-5 days of discharge Complete Complete   HRI or Home Care Consult Complete Complete   SW Recovery Care/Counseling Consult Complete Complete   Palliative Care Screening Not Applicable Not Applicable   Skilled Nursing Facility  Patient Refused

## 2024-05-01 DIAGNOSIS — M7989 Other specified soft tissue disorders: Secondary | ICD-10-CM | POA: Diagnosis not present

## 2024-05-01 DIAGNOSIS — R0602 Shortness of breath: Secondary | ICD-10-CM | POA: Diagnosis not present

## 2024-05-01 DIAGNOSIS — I5043 Acute on chronic combined systolic (congestive) and diastolic (congestive) heart failure: Secondary | ICD-10-CM | POA: Diagnosis not present

## 2024-05-01 LAB — BASIC METABOLIC PANEL WITH GFR
Anion gap: 9 (ref 5–15)
BUN: 15 mg/dL (ref 8–23)
CO2: 29 mmol/L (ref 22–32)
Calcium: 8.6 mg/dL — ABNORMAL LOW (ref 8.9–10.3)
Chloride: 99 mmol/L (ref 98–111)
Creatinine, Ser: 0.97 mg/dL (ref 0.44–1.00)
GFR, Estimated: >60 mL/min (ref >60)
Glucose, Bld: 126 mg/dL — ABNORMAL HIGH (ref 70–99)
Potassium: 3.9 mmol/L (ref 3.5–5.1)
Sodium: 137 mmol/L (ref 135–145)

## 2024-05-01 LAB — GLUCOSE, CAPILLARY
Glucose-Capillary: 99 mg/dL (ref 70–99)
Glucose-Capillary: 123 mg/dL — ABNORMAL HIGH (ref 70–99)
Glucose-Capillary: 113 mg/dL — ABNORMAL HIGH (ref 70–99)
Glucose-Capillary: 142 mg/dL — ABNORMAL HIGH (ref 70–99)

## 2024-05-01 MED ORDER — HYDROXYZINE HCL 10 MG/5ML PO SYRP: 10.0000 mg | ORAL_SOLUTION | Freq: Three times a day (TID) | ORAL | Status: DC | PRN

## 2024-05-01 MED ORDER — POTASSIUM CHLORIDE CRYS ER 20 MEQ PO TBCR
20.0000 meq | EXTENDED_RELEASE_TABLET | Freq: Once | ORAL | Status: AC
  Administered 2024-05-01: 20 meq via ORAL
  Filled 2024-05-01: qty 1

## 2024-05-01 MED ORDER — ALBUMIN HUMAN 25 % IV SOLN
12.5000 g | Freq: Once | INTRAVENOUS | Status: AC
Start: 2024-05-01 — End: 2024-05-01
  Administered 2024-05-01: 12.5 g via INTRAVENOUS
  Filled 2024-05-01: qty 50

## 2024-05-01 MED ORDER — TRIPLE ANTIBIOTIC 3.5-400-5000 EX OINT
1.0000 | TOPICAL_OINTMENT | Freq: Three times a day (TID) | CUTANEOUS | Status: AC
  Administered 2024-05-01 – 2024-05-02 (×6): 1 via TOPICAL
  Filled 2024-05-01: qty 1

## 2024-05-01 MED ORDER — FUROSEMIDE 10 MG/ML IJ SOLN
40.0000 mg | Freq: Once | INTRAMUSCULAR | Status: AC
  Administered 2024-05-01: 40 mg via INTRAVENOUS
  Filled 2024-05-01: qty 4

## 2024-05-01 NOTE — Progress Notes (Signed)
 Mobility Specialist - Progress Note   05/01/24 1348  Mobility  Activity Ambulated with assistance in room  Level of Assistance +2 (takes two people)  Press photographer wheel walker  Distance Ambulated (ft) 6 ft  Range of Motion/Exercises Active  Activity Response Tolerated well  Mobility Referral Yes  Mobility visit 1 Mobility  Mobility Specialist Start Time (ACUTE ONLY) 1330  Mobility Specialist Stop Time (ACUTE ONLY) 1348  Mobility Specialist Time Calculation (min) (ACUTE ONLY) 18 min   Pt was found in bed and agreeable to mobilize. Pt had x2 failed STS. NT to room and assisted with STS. Once standing c/o knee cap pain stating it was arthritis. Able to ambulate 63ft x3 and after first STS able to go to STS with CG. At EOS was left on recliner chair with all needs met. Call bell in reach and chair alarm on. NT and RN notified.  Lorna Rose Mobility Specialist

## 2024-05-01 NOTE — Plan of Care (Signed)
  Problem: Coping: Goal: Ability to adjust to condition or change in health will improve Outcome: Progressing   Problem: Fluid Volume: Goal: Ability to maintain a balanced intake and output will improve Outcome: Progressing   Problem: Nutritional: Goal: Maintenance of adequate nutrition will improve Outcome: Progressing   Problem: Education: Goal: Knowledge of General Education information will improve Description: Including pain rating scale, medication(s)/side effects and non-pharmacologic comfort measures Outcome: Progressing   Problem: Clinical Measurements: Goal: Diagnostic test results will improve Outcome: Progressing Goal: Respiratory complications will improve Outcome: Progressing   Problem: Coping: Goal: Level of anxiety will decrease Outcome: Progressing   Problem: Pain Managment: Goal: General experience of comfort will improve and/or be controlled Outcome: Progressing

## 2024-05-01 NOTE — Consult Note (Signed)
 [facility] requested for lymphedema clinic resources.   Lymphedema  Resources (updated 10/2021) Each site requires a referral from your primary care MD [facility] [address] [place], [place]  [phone] (Upper extremities)  [address] [place], [place] [phone] (Lower extremities, PATIENT CAN NOT HAVE A WOUND)  [facility] [address] [place], [place] [address] [phone]  [facility] [address] Medical Office [address]  [place], [place] [phone]  [facility] [address] [place], [place] [address] [phone]  [facility]  (only treatment for lymphedema related to cancer diagnosis) [address]  [place], [place] [address] [phone]    [facility] [address] [place], [place] [address] [phone] [facility] ([facility]) [phone] [address]. [address] [place], [place] [address] [phone]   Thank you,    Ronni Colace MSN, RN-BC, [employer] [phone]

## 2024-05-01 NOTE — Progress Notes (Signed)
 [facility]  PROGRESS NOTE  Maria [name] WUJ:[id number] DOB: 01-10-1956 DOA: 04/26/2024 PCP: Sharma Dears, NP   Brief HPI:   68 y.o. female with PMH significant for morbid obesity, DM2, HTN, HLD, chronic diastolic CHF, chronic bilateral lower EXTR lymphedema, PAD, anxiety/depression, lumbar disc disease, chronic pain on oxycodone , impaired mobility.  Lives at home alone.  Uses a walker to get around. Reports compliance to torsemide  BID. For the last several days, patient has noted progressive worsening of swelling and weeping of lower extremities.  No fever.    Assessment/Plan:   Acute on chronic cellulitis of bilateral lower extremity Bilateral lower extremity lymphedema Presented with progressive worsening of swelling and weeping of lower extremities. Evidence of cellulitis R>L No open wound or suspicion of abscess Started on IV Ancef ; last date 5/14 -Diuresed well with IV Lasix , IV albumin combination  Ace wrapping, leg elevation Outpatient follow-up at lymphedema clinic resources provided to the patient -Will continue taking torsemide  at discharge   Chronic diastolic and right-sided CHF HTN Most recent Echo from Feb 2024 showed ED 65-70%, G2DD, moderately elevated PASP to 47. PTA meds- Toprol , torsemide  twice daily, Aldactone  Resume Toprol .   BNP 52.8 - Started on Lasix  as above - IV Lasix  has been discontinued - She can start taking torsemide  40 mg daily when discharged from the hospital  Umbilical hernia - Patient has erythema and tenderness at the umbilicus -Denies abdominal pain - CT abdomen/pelvis with contrast done, showed moderate size umbilical hernia with fat only, no bowel involvement or inflammation  Fungal panniculitis Nystatin  powder order   Type 2 diabetes mellitus A1c 5.1 on February 2025 PTA meds-on Mounjaro every Wednesday Started on SSI/Accu-Cheks    Morbid Obesity  Body mass index is 60.14 kg/m. Patient has been advised to make  an attempt to improve diet and exercise patterns to aid in weight loss.   Chronic pain Chronic lumbar disc disease Reports taking oxycodone  10 mg every 8 hours Ordered.   Impaired mobility PT eval ordered -Patient to go home with home health PT   Anxiety/depression Klonopin  as needed,    Medications     enoxaparin  (LOVENOX ) injection  80 mg Subcutaneous Q24H   insulin  aspart  0-5 Units Subcutaneous QHS   insulin  aspart  0-9 Units Subcutaneous TID WC   metoprolol  succinate  25 mg Oral Daily   nystatin    Topical BID   pantoprazole   40 mg Oral Daily   pramipexole   0.5 mg Oral QHS     Data Reviewed:   CBG:  Recent Labs  Lab 04/30/24 0803 04/30/24 1135 04/30/24 1615 04/30/24 2058 05/01/24 0731  GLUCAP 144* 99 122* 118* 99    SpO2: 97 %    Vitals:   04/30/24 0437 04/30/24 1141 04/30/24 1951 05/01/24 0448  BP: 136/66 (!) 153/54 (!) 149/48 (!) 140/58  Pulse: 71 64 67 61  Resp:  18 18 15   Temp: 99.1 F (37.3 C) 98.1 F (36.7 C) 98.4 F (36.9 C) 98.2 F (36.8 C)  TempSrc: Oral Oral  Oral  SpO2: 98% 92% 98% 97%  Weight:      Height:          Data Reviewed:  Basic Metabolic Panel: Recent Labs  Lab 04/27/24 0413 04/27/24 1535 04/28/24 0354 04/29/24 0417 04/30/24 0429 05/01/24 0356  NA 139  --  139 139 139 137  K 2.9* 4.2 3.6 4.0 4.1 3.9  CL 100  --  101 102 102 99  CO2 31  --  31 30 28 29   GLUCOSE 92  --  102* 115* 104* 126*  BUN 15  --  14 11 11 15   CREATININE 1.17*  --  1.23* 1.06* 0.94 0.97  CALCIUM  8.4*  --  8.3* 8.4* 8.5* 8.6*  MG 2.2  --   --   --   --   --     CBC: Recent Labs  Lab 04/26/24 1556 04/27/24 0412 04/28/24 0354  WBC 10.2 8.9 7.7  NEUTROABS 6.2  --   --   HGB 12.1 10.6* 10.6*  HCT 38.7 33.8* 34.6*  MCV 93.7 93.4 94.5  PLT 311 243 246    LFT Recent Labs  Lab 04/26/24 1556 04/28/24 0354 04/30/24 0429  AST 17 19 15   ALT 12 10 7   ALKPHOS 88 64 56  BILITOT 1.0 0.6 0.8  PROT 7.4 6.0* 6.1*  ALBUMIN 4.0 3.1*  3.1*     Antibiotics: Anti-infectives (From admission, onward)    Start     Dose/Rate Route Frequency Ordered Stop   04/29/24 1000  ceFAZolin  (ANCEF ) IVPB 2g/100 mL premix        2 g 200 mL/hr over 30 Minutes Intravenous Every 8 hours 04/29/24 0950 05/03/24 1759   04/26/24 2200  ceFAZolin  (ANCEF ) IVPB 2g/100 mL premix  Status:  Discontinued        2 g 200 mL/hr over 30 Minutes Intravenous Every 8 hours 04/26/24 1853 04/29/24 0950   04/26/24 1615  fluconazole  (DIFLUCAN ) tablet 150 mg        150 mg Oral  Once 04/26/24 1609 04/26/24 1641        DVT prophylaxis: Lovenox   Code Status: Full code  Family Communication:    CONSULTS    Subjective   Patient has diuresed well with IV Lasix .  Objective    Physical Examination:   General-appears in no acute distress Heart-S1-S2, regular, no murmur auscultated Lungs-clear to auscultation bilaterally, no wheezing or crackles auscultated Abdomen-soft, nontender, no organomegaly Extremities-bilateral lower extremities in Ace wrap, 2+ edema in the lower extremities Neuro-alert, oriented x3, no focal deficit noted  Status is: Inpatient:             Ozell Blunt   [facility] If 7PM-7AM, please contact night-coverage at [email or web address], Office  [phone]   05/01/2024, 8:11 AM  LOS: 5 days

## 2024-05-02 LAB — COMPREHENSIVE METABOLIC PANEL WITH GFR
ALT: 6 U/L (ref 0–44)
AST: 16 U/L (ref 15–41)
Albumin: 4 g/dL (ref 3.5–5.0)
Alkaline Phosphatase: 64 U/L (ref 38–126)
Anion gap: 10 (ref 5–15)
BUN: 23 mg/dL (ref 8–23)
CO2: 27 mmol/L (ref 22–32)
Calcium: 9.1 mg/dL (ref 8.9–10.3)
Chloride: 99 mmol/L (ref 98–111)
Creatinine, Ser: 0.91 mg/dL (ref 0.44–1.00)
GFR, Estimated: >60 mL/min (ref >60)
Glucose, Bld: 119 mg/dL — ABNORMAL HIGH (ref 70–99)
Potassium: 4.5 mmol/L (ref 3.5–5.1)
Sodium: 136 mmol/L (ref 135–145)
Total Bilirubin: 0.7 mg/dL (ref 0.0–1.2)
Total Protein: 7.5 g/dL (ref 6.5–8.1)

## 2024-05-02 LAB — GLUCOSE, CAPILLARY
Glucose-Capillary: 101 mg/dL — ABNORMAL HIGH (ref 70–99)
Glucose-Capillary: 157 mg/dL — ABNORMAL HIGH (ref 70–99)
Glucose-Capillary: 106 mg/dL — ABNORMAL HIGH (ref 70–99)
Glucose-Capillary: 87 mg/dL (ref 70–99)

## 2024-05-02 MED ORDER — ENOXAPARIN SODIUM 60 MG/0.6ML IJ SOSY
60.0000 mg | PREFILLED_SYRINGE | INTRAMUSCULAR | Status: DC
  Administered 2024-05-02: 60 mg via SUBCUTANEOUS
  Filled 2024-05-02: qty 0.6

## 2024-05-02 NOTE — Progress Notes (Signed)
 Physical Therapy Treatment Patient Details Name: Maria [name] MRN: [id number] DOB: 09-Oct-1956 Today's Date: 05/02/2024   History of Present Illness 68 y.o. female admitted 04/26/24 with Acute on chronic cellulitis of bilateral lower extremity,  Bilateral lower extremity lymphedema, and Chronic diastolic and right-sided CHF.   PMH significant for morbid obesity, DM2, HTN, HLD, chronic diastolic CHF, chronic bilateral lower EXTR lymphedema, PAD, anxiety/depression, lumbar disc disease, chronic pain on oxycodone , impaired mobility.    PT Comments  Patient resting in bed at start of session and agreeable to mobilize with therapy. Pt eager to progress gait as hopeful to discharge home. Patient has not mobilized beyond bed<>chair since initial PT eval when she amb to bathroom and has been reliant on purewick for bladder. Discussed goals of mobilizing with staff for toileting as pt will be alone at home and needs to be able to amb room to room for daily tasks. Pt CGA for supine>sit EOB with significant extra time and use of bed features. Pt required min assist for stand from low and soft bed height. CGA for steps bed>chair with RW and able to complete sit<>stands from recliner with CGA. Pt amb 2x ~30' for ambulation with heavy reliance on RW due to bil knee and ankle pain, CGA For safety, and chair follow. EOS pt repositioned in recliner for comfort and encouraged time OOB. Will continue to progress as able. Pt prefers to return home with HHPT follow up.   If plan is discharge home, recommend the following: A little help with walking and/or transfers;A little help with bathing/dressing/bathroom;Help with stairs or ramp for entrance;Assist for transportation   Can travel by private vehicle        Equipment Recommendations  None recommended by PT    Recommendations for Other Services       Precautions / Restrictions Precautions Precautions: Fall Recall of Precautions/Restrictions:  Intact Restrictions Weight Bearing Restrictions Per Provider Order: No     Mobility  Bed Mobility Overal bed mobility: Needs Assistance Bed Mobility: Supine to Sit       Sit to supine: Contact guard assist, HOB elevated, Used rails   General bed mobility comments: signinfcant extra time to complete    Transfers Overall transfer level: Needs assistance Equipment used: Rolling walker (2 wheels) Transfers: Sit to/from Stand, Bed to chair/wheelchair/BSC Sit to Stand: Min assist, Contact guard assist                Ambulation/Gait Ambulation/Gait assistance: Min assist, Contact guard assist Gait Distance (Feet): 30 Feet (2x30) Assistive device: Rolling walker (2 wheels) Gait Pattern/deviations: Wide base of support, Step-to pattern, Decreased stride length, Shuffle Gait velocity: decr     General Gait Details: heavy realiance on RW for support. Min assist fading to [name] for safety with chair follow. pt limited by fatigue, LE weakness, and pain in bil knees and ankles.   Stairs             Wheelchair Mobility     Tilt Bed    Modified Rankin (Stroke Patients Only)       Balance Overall balance assessment: History of Falls, Needs assistance Sitting-balance support: Bilateral upper extremity supported, Feet supported Sitting balance-Leahy Scale: Good     Standing balance support: Bilateral upper extremity supported, During functional activity, Reliant on assistive device for balance Standing balance-Leahy Scale: Fair  Communication Communication Communication: No apparent difficulties  Cognition Arousal: Alert Behavior During Therapy: WFL for tasks assessed/performed   PT - Cognitive impairments: No apparent impairments                         Following commands: Intact      Cueing    Exercises      General Comments        Pertinent Vitals/Pain Pain Assessment Pain Assessment:  Faces Faces Pain Scale: Hurts even more Pain Location: knees Pain Descriptors / Indicators: Discomfort, Moaning Pain Intervention(s): Limited activity within patient's tolerance, Monitored during session, Repositioned, Patient requesting pain meds-RN notified    Home Living                          Prior Function            PT Goals (current goals can now be found in the care plan section) Acute Rehab PT Goals PT Goal Formulation: With patient Time For Goal Achievement: 05/11/24 Potential to Achieve Goals: Good Progress towards PT goals: Progressing toward goals    Frequency    Min 2X/week      PT Plan      Co-evaluation              AM-PAC PT "6 Clicks" Mobility   Outcome Measure  Help needed turning from your back to your side while in a flat bed without using bedrails?: A Little Help needed moving from lying on your back to sitting on the side of a flat bed without using bedrails?: A Little Help needed moving to and from a bed to a chair (including a wheelchair)?: A Little Help needed standing up from a chair using your arms (e.g., wheelchair or bedside chair)?: A Little Help needed to walk in hospital room?: A Little Help needed climbing 3-5 steps with a railing? : Total 6 Click Score: 16    End of Session Equipment Utilized During Treatment: Gait belt Activity Tolerance: Patient tolerated treatment well Patient left: in bed;with call bell/phone within reach;with bed alarm set Nurse Communication: Mobility status PT Visit Diagnosis: Difficulty in walking, not elsewhere classified (R26.2)     Time: 1610-9604 PT Time Calculation (min) (ACUTE ONLY): 32 min  Charges:    $Gait Training: 8-22 mins $Therapeutic Activity: 8-22 mins PT General Charges $$ ACUTE PT VISIT: 1 Visit                     Tish Forge, DPT Acute Rehabilitation Services Office [phone]  05/02/24 1:29 PM

## 2024-05-02 NOTE — Plan of Care (Signed)
  Problem: Education: Goal: Knowledge of General Education information will improve Description: Including pain rating scale, medication(s)/side effects and non-pharmacologic comfort measures Outcome: Progressing   Problem: Clinical Measurements: Goal: Diagnostic test results will improve 05/02/2024 1800 by Hilda Lovings, RN Outcome: Progressing 05/02/2024 0756 by Hilda Lovings, RN Outcome: Progressing   Problem: Activity: Goal: Risk for activity intolerance will decrease 05/02/2024 1800 by Hilda Lovings, RN Outcome: Progressing 05/02/2024 0756 by Hilda Lovings, RN Outcome: Progressing   Problem: Nutrition: Goal: Adequate nutrition will be maintained 05/02/2024 1800 by Hilda Lovings, RN Outcome: Progressing 05/02/2024 0756 by Hilda Lovings, RN Outcome: Progressing   Problem: Pain Managment: Goal: General experience of comfort will improve and/or be controlled 05/02/2024 1800 by Hilda Lovings, RN Outcome: Progressing 05/02/2024 0756 by Hilda Lovings, RN Outcome: Progressing   Problem: Safety: Goal: Ability to remain free from injury will improve 05/02/2024 1800 by Hilda Lovings, RN Outcome: Progressing 05/02/2024 0756 by Hilda Lovings, RN Outcome: Progressing

## 2024-05-02 NOTE — Plan of Care (Signed)
  Problem: Education: Goal: Knowledge of General Education information will improve Description: Including pain rating scale, medication(s)/side effects and non-pharmacologic comfort measures Outcome: Progressing   Problem: Clinical Measurements: Goal: Diagnostic test results will improve Outcome: Progressing   Problem: Activity: Goal: Risk for activity intolerance will decrease Outcome: Progressing   Problem: Nutrition: Goal: Adequate nutrition will be maintained Outcome: Progressing   Problem: Pain Managment: Goal: General experience of comfort will improve and/or be controlled Outcome: Progressing   Problem: Safety: Goal: Ability to remain free from injury will improve Outcome: Progressing

## 2024-05-03 DIAGNOSIS — I5033 Acute on chronic diastolic (congestive) heart failure: Secondary | ICD-10-CM

## 2024-05-03 LAB — GLUCOSE, CAPILLARY: Glucose-Capillary: 111 mg/dL — ABNORMAL HIGH (ref 70–99)

## 2024-05-03 MED ORDER — SPIRONOLACTONE 25 MG PO TABS: 25.0000 mg | ORAL_TABLET | Freq: Every day | ORAL | Status: DC

## 2024-05-03 NOTE — Discharge Summary (Signed)
 Physician Discharge Summary  Maria [name] ZOX:[id number] DOB: [birth year] DOA: 04/26/2024  PCP: Sharma Dears, NP  Admit date: 04/26/2024 Discharge date: 05/03/2024  Admitted From: Home Disposition:  Home with home health   Recommendations for Outpatient Follow-up:  Follow up with PCP Follow-up with outpatient lymphedema clinic, resources provided to patient  Discharge Condition: Stable CODE STATUS: Full  Diet recommendation: Heart healthy diet, fluid and sodium restriction   Brief/Interim Summary: Maria [name] is a 68 y.o. female with PMH significant for morbid obesity, DM2, HTN, HLD, chronic diastolic CHF, chronic bilateral lower extremity lymphedema, PAD, anxiety/depression, lumbar disc disease, chronic pain on oxycodone , impaired mobility.  Lives at home alone.  Uses a walker to get around. Reports compliance to torsemide  BID. For the last several days, patient has noted progressive worsening of swelling and weeping of lower extremities.  No fever.  She was admitted due to lower extremity cellulitis as well as lymphedema.  CHF assessed patient.  She was given IV Lasix  which was transition back to torsemide  for CHF management.  Discharge Diagnoses:   Principal Problem:   Acute on chronic heart failure with preserved ejection fraction (HFpEF) (HCC) Active Problems:   Lymphedema   Chronic pain syndrome   GAD (generalized anxiety disorder)   Chronic low back pain   Lumbar disc disease   Umbilical hernia   DM2 (diabetes mellitus, type 2) (HCC)   Cellulitis of lower extremity   Morbid obesity [facility])    Discharge Instructions  Discharge Instructions     (HEART FAILURE PATIENTS) Call MD:  Anytime you have any of the following symptoms: 1) 3 pound weight gain in 24 hours or 5 pounds in 1 week 2) shortness of breath, with or without a dry hacking cough 3) swelling in the hands, feet or stomach 4) if you have to sleep on extra pillows at night in order to breathe.   Complete  by: As directed    Call MD for:  difficulty breathing, headache or visual disturbances   Complete by: As directed    Call MD for:  extreme fatigue   Complete by: As directed    Call MD for:  hives   Complete by: As directed    Call MD for:  persistant dizziness or light-headedness   Complete by: As directed    Call MD for:  persistant nausea and vomiting   Complete by: As directed    Call MD for:  severe uncontrolled pain   Complete by: As directed    Call MD for:  temperature >100.4   Complete by: As directed    Diet - low sodium heart healthy   Complete by: As directed    Discharge instructions   Complete by: As directed    You were cared for by a hospitalist during your hospital stay. If you have any questions about your discharge medications or the care you received while you were in the hospital after you are discharged, you can call the unit and ask to speak with the hospitalist on call if the hospitalist that took care of you is not available. Once you are discharged, your primary care physician will handle any further medical issues. Please note that NO REFILLS for any discharge medications will be authorized once you are discharged, as it is imperative that you return to your primary care physician (or establish a relationship with a primary care physician if you do not have one) for your aftercare needs so that they can reassess  your need for medications and monitor your lab values.   Discharge wound care:   Complete by: As directed    Cleanse bilateral lower legs (intact skin and open ulcerations) with Vashe wound cleanser Timm Foot [phone]) do not rinse and allow to air dry. Apply Xeroform gauze Timm Foot [phone]) to lower leg wounds, cover with ABD pads and secure with Kerlix roll gauze beginning right above toes and ending right below knees.  Apply Ace bandage wrapped in same fashion as Kerlix for light compression.  Patient should keep legs elevated as much as possible   Increase  activity slowly   Complete by: As directed       Allergies as of 05/03/2024       Reactions   Amoxicillin -pot Clavulanate Nausea And Vomiting, Other (See Comments)   Projectile vomiting   Adhesive [tape] Other (See Comments)   Tears skin off - use paper tape    Codeine Other (See Comments)   hallucinations   Crestor  [rosuvastatin  Calcium ] Other (See Comments)   Severe muscle cramps   Cyclobenzaprine  Other (See Comments)   Hallucinations    Cymbalta  [duloxetine  Hcl] Other (See Comments)   Hallucinations    Dilaudid  [hydromorphone ] Other (See Comments)   Headache and Dizziness   Gabapentin  Other (See Comments)   Trembling   Lyrica [pregabalin] Other (See Comments)   weakness   Morphine  And Codeine Nausea And Vomiting, Other (See Comments)   Migraine headaches   Vicodin [hydrocodone-acetaminophen ] Other (See Comments)   hallucinations   Latex Rash        Medication List     TAKE these medications    albuterol  108 (90 Base) MCG/ACT inhaler Commonly known as: VENTOLIN  HFA Inhale 1 puff into the lungs every 6 (six) hours as needed for wheezing or shortness of breath.   cholecalciferol  25 MCG (1000 UNIT) tablet Commonly known as: VITAMIN D3 Take 1,000 Units by mouth daily.   clonazePAM  1 MG tablet Commonly known as: KLONOPIN  Take 1 tablet (1 mg total) by mouth 2 (two) times daily as needed for up to 5 days. for anxiety What changed:  reasons to take this additional instructions   cyanocobalamin  1000 MCG tablet Commonly known as: VITAMIN B12 Take 1 tablet (1,000 mcg total) by mouth daily.   famotidine  40 MG tablet Commonly known as: PEPCID  Take 40 mg by mouth at bedtime.   fluticasone  50 MCG/ACT nasal spray Commonly known as: FLONASE  Place 1 spray into both nostrils 2 (two) times daily as needed for rhinitis or allergies.   Metamucil Fiber Chew Chew 3 each by mouth at bedtime as needed (to increase fiber intake).   metoprolol  succinate 25 MG 24 hr  tablet Commonly known as: TOPROL -XL Take 1 tablet (25 mg total) by mouth daily.   Mounjaro 15 MG/0.5ML Pen Generic drug: tirzepatide Inject 15 mg into the skin every Wednesday.   Narcan  4 MG/0.1ML Liqd nasal spray kit Generic drug: naloxone  Place 0.4 mg into the nose daily as needed (opioid overdose).   nystatin  powder Commonly known as: MYCOSTATIN /NYSTOP  Apply 1 Application topically daily as needed (for irritation).   Ocean Nasal Spray 0.65 % nasal spray Generic drug: sodium chloride  Place 1 spray into the nose as needed for congestion.   ondansetron  4 MG disintegrating tablet Commonly known as: ZOFRAN -ODT Take 4 mg by mouth every 8 (eight) hours as needed for nausea or vomiting (dissolve the tablet orally).   Oxycodone  HCl 10 MG Tabs Take 10 mg by mouth every 8 (eight) hours.  pantoprazole  40 MG tablet Commonly known as: PROTONIX  TAKE ONE TABLET BY MOUTH DAILY What changed: when to take this   Potassium Chloride  ER 20 MEQ Tbcr Take 40 mEq by mouth in the morning.   pramipexole  0.5 MG tablet Commonly known as: MIRAPEX  Take 0.5 mg by mouth at bedtime.   spironolactone  25 MG tablet Commonly known as: ALDACTONE  Take 1 tablet (25 mg total) by mouth daily.   torsemide  20 MG tablet Commonly known as: DEMADEX  Take 2 tablets (40 mg total) by mouth daily. What changed: when to take this   Tylenol  8 Hour Arthritis Pain 650 MG CR tablet Generic drug: acetaminophen  Take 1,300 mg by mouth in the morning and at bedtime.               Discharge Care Instructions  (From admission, onward)           Start     Ordered   05/03/24 0000  Discharge wound care:       Comments: Cleanse bilateral lower legs (intact skin and open ulcerations) with Vashe wound cleanser Timm Foot [phone]) do not rinse and allow to air dry. Apply Xeroform gauze Timm Foot [phone]) to lower leg wounds, cover with ABD pads and secure with Kerlix roll gauze beginning right above toes and ending right  below knees.  Apply Ace bandage wrapped in same fashion as Kerlix for light compression.  Patient should keep legs elevated as much as possible   05/03/24 0906            Follow-up Information     [facility], [facility]. Schedule an appointment as soon as possible for a visit.   Specialty: General Surgery Why: if you would like to discuss elective repair of your abdomina wall hernia. Contact information: [address] [place] [address] [phone]         Sharma Dears, NP. Schedule an appointment as soon as possible for a visit in 1 week(s).   Specialty: Nurse Practitioner Contact information: [address] [place] [address] [phone]         [facility], [facility] Follow up.   Specialty: Home Health Services Why: Home Health will follow up with you after discharge. Contact information: [address] [place] [address] [phone]                Allergies  Allergen Reactions   Amoxicillin -Pot Clavulanate Nausea And Vomiting and Other (See Comments)    Projectile vomiting   Adhesive [Tape] Other (See Comments)    Tears skin off - use paper tape    Codeine Other (See Comments)    hallucinations   Crestor  [Rosuvastatin  Calcium ] Other (See Comments)    Severe muscle cramps   Cyclobenzaprine  Other (See Comments)    Hallucinations    Cymbalta  [Duloxetine  Hcl] Other (See Comments)    Hallucinations    Dilaudid  [Hydromorphone ] Other (See Comments)    Headache and Dizziness   Gabapentin  Other (See Comments)    Trembling   Lyrica [Pregabalin] Other (See Comments)    weakness   Morphine  And Codeine Nausea And Vomiting and Other (See Comments)    Migraine headaches   Vicodin [Hydrocodone-Acetaminophen ] Other (See Comments)    hallucinations   Latex Rash     Procedures/Studies: CT ABDOMEN PELVIS W CONTRAST Result Date: 04/28/2024 CLINICAL DATA:  Bowel obstruction suspected Umbilical hernia with  erythema and tenderness at the umbilicus. EXAM: CT ABDOMEN AND PELVIS WITH CONTRAST TECHNIQUE: Multidetector  CT imaging of the abdomen and pelvis was performed using the standard protocol following bolus administration of intravenous contrast. RADIATION DOSE REDUCTION: This exam was performed according to the departmental dose-optimization program which includes automated exposure control, adjustment of the mA and/or kV according to patient size and/or use of iterative reconstruction technique. CONTRAST:  OMNIPAQUE  IOHEXOL  300 MG/ML  SOLN COMPARISON:  CT 10/09/2023 FINDINGS: Lower chest: Heart is upper normal in size. Aortic atherosclerosis of the descending thoracic aorta. In subsegmental atelectasis or scarring in the left lower lobe. Hepatobiliary: The liver is upper normal in size spanning 18.5 cm cranial caudal. No focal hepatic abnormality. Gallbladder physiologically distended, no calcified stone. No biliary dilatation. Pancreas: No ductal dilatation or inflammation. Spleen: Upper normal spanning 12.8 cm AP. No focal abnormality. Splenule anteriorly. Adrenals/Urinary Tract: No adrenal nodule. No hydronephrosis, renal calculi or suspicious renal abnormality. No evidence of renal inflammation. Equivocal bladder wall thickening at the dome. Stomach/Bowel: Unremarkable appearance of the stomach. There is no small bowel obstruction or inflammation. Enteric contrast reaches the cecum. Moderate to large volume of stool throughout the Wideman. Small amount of stool in the rectum. Left colonic diverticulosis without diverticulitis. Diminutive normal appendix. Vascular/Lymphatic: Aortic atherosclerosis without aneurysm. Multiple small retroperitoneal and central mesenteric lymph nodes, not enlarged by size criteria. No suspicious lymphadenopathy. Reproductive: Status post hysterectomy. No adnexal masses. Other: Moderate-sized umbilical hernia contains only fat. No bowel involvement. There is no significant  stranding within the hernia sac or in the adjacent subcutaneous tissues. No ascites or free air. Scattered soft tissue densities in the anterior abdominal wall typical of medication injection sites. Musculoskeletal: Diffuse facet hypertrophy in the lumbar spine. Transitional lumbosacral anatomy. There are no acute or suspicious osseous abnormalities. IMPRESSION: 1. Moderate-sized umbilical hernia contains only fat. No bowel involvement or inflammation. 2. No bowel obstruction moderate to large volume of stool throughout the Arman. Left colonic diverticulosis without diverticulitis. 3. Equivocal bladder wall thickening at the dome, recommend correlation with urinalysis. Aortic Atherosclerosis (ICD10-I70.0). Electronically Signed   By: Chadwick Colonel M.D.   On: 04/28/2024 18:39   DG Chest Port 1 View Result Date: 04/26/2024 CLINICAL DATA:  Shortness of breath.  Bilateral leg swelling. EXAM: PORTABLE CHEST 1 VIEW COMPARISON:  01/31/2024. FINDINGS: Bilateral lung fields are clear. No pulmonary edema. Bilateral costophrenic angles are clear. Stable mildly enlarged cardio-mediastinal silhouette. No acute osseous abnormalities. Lower cervical spinal fixation hardware noted. The soft tissues are within normal limits. IMPRESSION: No active disease. Electronically Signed   By: Beula Brunswick M.D.   On: 04/26/2024 16:37       Discharge Exam: Vitals:   05/02/24 2138 05/03/24 0602  BP: (!) 137/49 (!) 141/60  Pulse: 70 60  Resp: 18 16  Temp: 97.6 F (36.4 C) 97.7 F (36.5 C)  SpO2: 99% 100%    General: Pt is alert, awake, not in acute distress Cardiovascular: RRR, S1/S2 +, non pitting edema Respiratory: CTA bilaterally, no wheezing, no rhonchi, no respiratory distress, no conversational dyspnea  Abdominal: Soft, NT, ND, bowel sounds + Psych: Normal mood and affect, stable judgement and insight     The results of significant diagnostics from this hospitalization (including imaging, microbiology,  ancillary and laboratory) are listed below for reference.     Microbiology: Recent Results (from the past 240 hours)  MRSA Next Gen by PCR, Nasal     Status: None   Collection Time: 04/26/24 10:48 PM   Specimen: Nasal Mucosa; Nasal Swab  Result Value Ref Range Status  MRSA by PCR Next Gen NOT DETECTED NOT DETECTED Final    Comment: (NOTE) The GeneXpert MRSA Assay (FDA approved for NASAL specimens only), is one component of a comprehensive MRSA colonization surveillance program. It is not intended to diagnose MRSA infection nor to guide or monitor treatment for MRSA infections. Test performance is not FDA approved in patients less than 15 years old. Performed at [facility], [address] [place], [place] [address]      Labs: BNP (last 3 results) Recent Labs    04/26/24 1556 04/27/24 0412 04/27/24 1740  BNP 31.4 29.6 52.8   Basic Metabolic Panel: Recent Labs  Lab 04/27/24 0413 04/27/24 1535 04/28/24 0354 04/29/24 0417 04/30/24 0429 05/01/24 0356 05/02/24 0413  NA 139  --  139 139 139 137 136  K 2.9*   < > 3.6 4.0 4.1 3.9 4.5  CL 100  --  101 102 102 99 99  CO2 31  --  31 30 28 29 27   GLUCOSE 92  --  102* 115* 104* 126* 119*  BUN 15  --  14 11 11 15 23   CREATININE 1.17*  --  1.23* 1.06* 0.94 0.97 0.91  CALCIUM  8.4*  --  8.3* 8.4* 8.5* 8.6* 9.1  MG 2.2  --   --   --   --   --   --    < > = values in this interval not displayed.   Liver Function Tests: Recent Labs  Lab 04/26/24 1556 04/28/24 0354 04/30/24 0429 05/02/24 0413  AST 17 19 15 16   ALT 12 10 7 6   ALKPHOS 88 64 56 64  BILITOT 1.0 0.6 0.8 0.7  PROT 7.4 6.0* 6.1* 7.5  ALBUMIN 4.0 3.1* 3.1* 4.0   No results for input(s): "LIPASE", "AMYLASE" in the last 168 hours. No results for input(s): "AMMONIA" in the last 168 hours. CBC: Recent Labs  Lab 04/26/24 1556 04/27/24 0412 04/28/24 0354  WBC 10.2 8.9 7.7  NEUTROABS 6.2  --   --   HGB 12.1 10.6* 10.6*  HCT 38.7 33.8*  34.6*  MCV 93.7 93.4 94.5  PLT 311 243 246   Cardiac Enzymes: No results for input(s): "CKTOTAL", "CKMB", "CKMBINDEX", "TROPONINI" in the last 168 hours. BNP: Invalid input(s): "POCBNP" CBG: Recent Labs  Lab 05/02/24 0747 05/02/24 1138 05/02/24 1618 05/02/24 2140 05/03/24 0724  GLUCAP 101* 157* 106* 87 111*   D-Dimer No results for input(s): "DDIMER" in the last 72 hours. Hgb A1c No results for input(s): "HGBA1C" in the last 72 hours. Lipid Profile No results for input(s): "CHOL", "HDL", "LDLCALC", "TRIG", "CHOLHDL", "LDLDIRECT" in the last 72 hours. Thyroid  function studies No results for input(s): "TSH", "T4TOTAL", "T3FREE", "THYROIDAB" in the last 72 hours.  Invalid input(s): "FREET3" Anemia work up No results for input(s): "VITAMINB12", "FOLATE", "FERRITIN", "TIBC", "IRON", "RETICCTPCT" in the last 72 hours. Urinalysis    Component Value Date/Time   COLORURINE YELLOW 01/30/2024 2154   APPEARANCEUR HAZY (A) 01/30/2024 2154   LABSPEC 1.016 01/30/2024 2154   PHURINE 5.0 01/30/2024 2154   GLUCOSEU NEGATIVE 01/30/2024 2154   GLUCOSEU NEGATIVE 01/30/2022 1342   HGBUR SMALL (A) 01/30/2024 2154   BILIRUBINUR NEGATIVE 01/30/2024 2154   KETONESUR NEGATIVE 01/30/2024 2154   PROTEINUR NEGATIVE 01/30/2024 2154   UROBILINOGEN 0.2 01/30/2022 1342   NITRITE NEGATIVE 01/30/2024 2154   LEUKOCYTESUR LARGE (A) 01/30/2024 2154   Sepsis Labs Recent Labs  Lab 04/26/24 1556 04/27/24 0412 04/28/24 0354  WBC 10.2 8.9 7.7  Microbiology Recent Results (from the past 240 hours)  MRSA Next Gen by PCR, Nasal     Status: None   Collection Time: 04/26/24 10:48 PM   Specimen: Nasal Mucosa; Nasal Swab  Result Value Ref Range Status   MRSA by PCR Next Gen NOT DETECTED NOT DETECTED Final    Comment: (NOTE) The GeneXpert MRSA Assay (FDA approved for NASAL specimens only), is one component of a comprehensive MRSA colonization surveillance program. It is not intended to diagnose MRSA  infection nor to guide or monitor treatment for MRSA infections. Test performance is not FDA approved in patients less than 38 years old. Performed at [facility], [address] [place], [place] [address]      Patient was seen and examined on the day of discharge and was found to be in stable condition. Time coordinating discharge: 40 minutes including assessment and coordination of care, as well as examination of the patient.   SIGNED:  Daren Eck, DO [facility] 05/03/2024, 11:01 AM

## 2024-05-03 NOTE — Discharge Instructions (Signed)
 Lymphedema  Resources (updated 10/2021) Each site requires a referral from your primary care MD [facility] [address] [place], [place]  [phone] (Upper extremities)  [address] [place], [place] [phone] (Lower extremities, PATIENT CAN NOT HAVE A WOUND)  [facility] [address] [place], [place] [address] [phone]  [facility] [address]  [place], [place] [phone]  [facility] [address] [place], [place] [address] [phone]  [name]  (only treatment for lymphedema related to cancer diagnosis) [address]  [place], [place] [address] [phone]    [facility] [address] [place], [place] [address] [phone] [facility] ([facility]) [phone] [address] [place], [place] [address] [phone]

## 2024-05-03 NOTE — Progress Notes (Signed)
 Physical Therapy Treatment Patient Details Name: Maria [name] MRN: [id number] DOB: Sep 23, 1956 Today's Date: 05/03/2024   History of Present Illness 68 y.o. female admitted 04/26/24 with Acute on chronic cellulitis of bilateral lower extremity,  Bilateral lower extremity lymphedema, and Chronic diastolic and right-sided CHF.   PMH significant for morbid obesity, DM2, HTN, HLD, chronic diastolic CHF, chronic bilateral lower EXTR lymphedema, PAD, anxiety/depression, lumbar disc disease, chronic pain on oxycodone , impaired mobility.    PT Comments   Pt admitted with above diagnosis.  Pt currently with functional limitations due to the deficits listed below (see PT Problem List). Pt in bed when PT arrived. Pt reported she is going to d/c home today. Pt states she will have HH services. Pt reported she is planning on taking her medications consistently and specifically at 9 am and 9:30 pm as well as setting a timer to get up more frequently for ambulation and toileting. Pt is mod I with increased time and HOB elevated for supine to sit, pt required CGA and cues for sit to stand  from EOB, S for transfer to recliner, CGA and progressing to close S for gait tasks with RW 30 feet. Pt left seated in recliner and all needs in place. Pt Pt will benefit from acute skilled PT to increase their independence and safety with mobility to allow discharge.      If plan is discharge home, recommend the following: A little help with walking and/or transfers;A little help with bathing/dressing/bathroom;Help with stairs or ramp for entrance;Assist for transportation   Can travel by private vehicle        Equipment Recommendations  None recommended by PT    Recommendations for Other Services       Precautions / Restrictions Precautions Precautions: Fall Recall of Precautions/Restrictions: Intact Restrictions Weight Bearing Restrictions Per Provider Order: No     Mobility  Bed Mobility Overal bed mobility:  Needs Assistance Bed Mobility: Supine to Sit     Supine to sit: Modified independent (Device/Increase time), HOB elevated     General bed mobility comments: increased time    Transfers Overall transfer level: Needs assistance Equipment used: Rolling walker (2 wheels) Transfers: Sit to/from Stand, Bed to chair/wheelchair/BSC Sit to Stand: Contact guard assist, Supervision           General transfer comment: pt required CGA for sit to stand from EOB with cues for power up and extension posure, pt able to recall proper UE and AD placement with transfer to recliner s/p gait tasks with S and min cues    Ambulation/Gait Ambulation/Gait assistance: Contact guard assist, Supervision Gait Distance (Feet): 30 Feet Assistive device: Rolling walker (2 wheels) Gait Pattern/deviations: Wide base of support, Step-to pattern, Decreased stride length, Shuffle, Decreased step length - right, Decreased step length - left Gait velocity: decreased     General Gait Details: pt initally required CGA for safety with gait tasks due to reports of B LE stiffness no apparent instability nor LOB noted, pt exhibited shuffling pattern with limited B LE foot clearance, shorttened stride length and wide BOS with heavy reliance on B UE support at RW with flexed posture and slow cadence   Stairs             Wheelchair Mobility     Tilt Bed    Modified Rankin (Stroke Patients Only)       Balance Overall balance assessment: History of Falls, Needs assistance Sitting-balance support: Bilateral upper extremity supported, Feet supported Sitting balance-Leahy  Scale: Good     Standing balance support: Bilateral upper extremity supported, During functional activity, Reliant on assistive device for balance Standing balance-Leahy Scale: Fair                              Hotel manager: No apparent difficulties  Cognition Arousal: Alert Behavior During  Therapy: WFL for tasks assessed/performed   PT - Cognitive impairments: No apparent impairments                         Following commands: Intact      Cueing    Exercises      General Comments        Pertinent Vitals/Pain Pain Assessment Pain Assessment: No/denies pain (pt reported that her legs are sometimes stiff but no pain this am)    Home Living                          Prior Function            PT Goals (current goals can now be found in the care plan section) Acute Rehab PT Goals PT Goal Formulation: With patient Time For Goal Achievement: 05/11/24 Potential to Achieve Goals: Good Progress towards PT goals: Progressing toward goals    Frequency    Min 2X/week      PT Plan      Co-evaluation              AM-PAC PT "6 Clicks" Mobility   Outcome Measure  Help needed turning from your back to your side while in a flat bed without using bedrails?: None Help needed moving from lying on your back to sitting on the side of a flat bed without using bedrails?: A Little Help needed moving to and from a bed to a chair (including a wheelchair)?: A Little Help needed standing up from a chair using your arms (e.g., wheelchair or bedside chair)?: A Little Help needed to walk in hospital room?: A Little Help needed climbing 3-5 steps with a railing? : Total 6 Click Score: 17    End of Session Equipment Utilized During Treatment: Gait belt Activity Tolerance: Patient tolerated treatment well Patient left: with call bell/phone within reach;in chair;with chair alarm set Nurse Communication: Mobility status PT Visit Diagnosis: Difficulty in walking, not elsewhere classified (R26.2)     Time: 3086-5784 PT Time Calculation (min) (ACUTE ONLY): 26 min  Charges:    $Gait Training: 8-22 mins $Therapeutic Activity: 8-22 mins PT General Charges $$ ACUTE PT VISIT: 1 Visit                     Cary Clarks, PT Acute Rehab    [name] 05/03/2024, 12:00 PM

## 2024-05-03 NOTE — Plan of Care (Signed)

## 2024-05-17 NOTE — Progress Notes (Unsigned)
  Cardiology Office Note:   Date:  05/18/2024  ID:  Maria [name], DOB 1956-08-12, MRN [id number] PCP: Sharma Dears, NP  [facility] Providers Cardiologist:  Eilleen Grates, MD {  History of Present Illness:   Maria [name] is a 68 y.o. female who presents for follow up of acute on chronic diastolic dysfunction. Her last echo was Feb 2024 with NL EF.  She had moderately elevated pulmonary pressure.   Since I last saw her her she was in the hospital recently with acute on chronic diastolic HF.  I reviewed these records for this visit.    She was in the hospital May 2025 with lower extremity swelling .  She was admitted with acute on chronic diastolic HF.  She was treated with IV diuresis.    She has lost a total of more than 70 pounds.  She seems to be keeping her weight down at home.  Nursing comes a couple times a week to change the wrappings on her legs.  She is working with physical therapy.  She has moved to a new apartment that she finds more manageable.  She is watching her salt and fluid.  She is not having any new shortness of breath, PND or orthopnea.  She has had no new palpitations, presyncope or syncope.  She denies any chest pressure.  ROS: Positive for short-term memory loss, leg weakness. Otherwise as stated in the HPI and negative for all other systems.  Studies Reviewed:    EKG:   NA  Risk Assessment/Calculations:       Physical Exam:   VS:  BP (!) 154/70   Pulse 70   Ht 5\' 3"  (1.6 m)   Wt 299 lb (135.6 kg)   SpO2 99%   BMI 52.97 kg/m    Wt Readings from Last 3 Encounters:  05/18/24 299 lb (135.6 kg)  05/03/24 296 lb 11.8 oz (134.6 kg)  02/01/24 (!) 340 lb 2.7 oz (154.3 kg)     GEN: Well nourished, well developed in no acute distress NECK: No JVD; No carotid bruits CARDIAC: RRR, soft apical systolic murmurs, rubs, gallops RESPIRATORY:  Clear to auscultation without rales, wheezing or rhonchi  ABDOMEN: Soft, non-tender,  non-distended EXTREMITIES:  No edema; No deformity   ASSESSMENT AND PLAN:   Acute on chronic diastolic CHF:    She seems to be euvolemic relatively for her.  I am pleased with her progress.  No change in therapy.  Essential hypertension  :   The blood pressure at home is in the 120s over 70s and checked by Dr. Romaine Closs health nurses.  No change in therapy.   Type 2 diabetes : A1c was 5.1 in February which is excellent and down from previous.   Elevated pulmonary pressures: This is multifactorial probably in some respects hypoventilation obesity syndrome.  No change in therapy.  She continues with weight loss and volume management.     Follow up with APP in six months  Signed, Eilleen Grates, MD

## 2024-05-18 ENCOUNTER — Ambulatory Visit: Attending: Cardiology | Admitting: Cardiology

## 2024-05-18 ENCOUNTER — Encounter: Payer: Self-pay | Admitting: Cardiology

## 2024-05-18 VITALS — BP 154/70 | HR 70 | Ht 63.0 in | Wt 299.0 lb

## 2024-05-18 DIAGNOSIS — E118 Type 2 diabetes mellitus with unspecified complications: Secondary | ICD-10-CM | POA: Diagnosis not present

## 2024-05-18 DIAGNOSIS — I10 Essential (primary) hypertension: Secondary | ICD-10-CM | POA: Diagnosis not present

## 2024-05-18 DIAGNOSIS — I272 Pulmonary hypertension, unspecified: Secondary | ICD-10-CM | POA: Diagnosis not present

## 2024-05-18 DIAGNOSIS — I5033 Acute on chronic diastolic (congestive) heart failure: Secondary | ICD-10-CM | POA: Diagnosis not present

## 2024-05-18 NOTE — Patient Instructions (Signed)
 Medication Instructions:   No changes *If you need a refill on your cardiac medications before your next appointment, please call your pharmacy*   Lab Work: Not needed If you have labs (blood work) drawn today and your tests are completely normal, you will receive your results only by: MyChart Message (if you have MyChart) OR A paper copy in the mail If you have any lab test that is abnormal or we need to change your treatment, we will call you to review the results.   Testing/Procedures:  Not needed  Follow-Up: At [facility], you and your health needs are our priority.  As part of our continuing mission to provide you with exceptional heart care, we have created designated Provider Care Teams.  These Care Teams include your primary Cardiologist (physician) and Advanced Practice Providers (APPs -  Physician Assistants and Nurse Practitioners) who all work together to provide you with the care you need, when you need it.     Your next appointment:   6 month(s)  The format for your next appointment:   In Person  Provider:   Liane Redman, PA-C

## 2024-06-12 LAB — LAB REPORT - SCANNED: EGFR: 63

## 2024-06-17 ENCOUNTER — Emergency Department (HOSPITAL_COMMUNITY)
Admission: EM | Admit: 2024-06-17 | Discharge: 2024-06-17 | Disposition: A | Discharge status: Home / Self Care | Attending: Emergency Medicine | Admitting: Emergency Medicine

## 2024-06-17 ENCOUNTER — Emergency Department (HOSPITAL_COMMUNITY)

## 2024-06-17 DIAGNOSIS — M79604 Pain in right leg: Secondary | ICD-10-CM | POA: Diagnosis present

## 2024-06-17 DIAGNOSIS — Z79899 Other long term (current) drug therapy: Secondary | ICD-10-CM | POA: Diagnosis not present

## 2024-06-17 DIAGNOSIS — M79605 Pain in left leg: Secondary | ICD-10-CM | POA: Diagnosis not present

## 2024-06-17 DIAGNOSIS — I509 Heart failure, unspecified: Secondary | ICD-10-CM | POA: Diagnosis not present

## 2024-06-17 DIAGNOSIS — I110 Hypertensive heart disease with heart failure: Secondary | ICD-10-CM | POA: Diagnosis not present

## 2024-06-17 DIAGNOSIS — Z91040 Latex allergy status: Secondary | ICD-10-CM | POA: Insufficient documentation

## 2024-06-17 DIAGNOSIS — M79661 Pain in right lower leg: Secondary | ICD-10-CM | POA: Diagnosis not present

## 2024-06-17 DIAGNOSIS — E119 Type 2 diabetes mellitus without complications: Secondary | ICD-10-CM | POA: Diagnosis not present

## 2024-06-17 DIAGNOSIS — M7989 Other specified soft tissue disorders: Secondary | ICD-10-CM | POA: Diagnosis not present

## 2024-06-17 LAB — COMPREHENSIVE METABOLIC PANEL WITH GFR
ALT: 13 U/L (ref 0–44)
AST: 15 U/L (ref 15–41)
Albumin: 3.5 g/dL (ref 3.5–5.0)
Alkaline Phosphatase: 75 U/L (ref 38–126)
Anion gap: 9 (ref 5–15)
BUN: 15 mg/dL (ref 8–23)
CO2: 27 mmol/L (ref 22–32)
Calcium: 8.9 mg/dL (ref 8.9–10.3)
Chloride: 101 mmol/L (ref 98–111)
Creatinine, Ser: 0.99 mg/dL (ref 0.44–1.00)
GFR, Estimated: >60 mL/min (ref >60)
Glucose, Bld: 92 mg/dL (ref 70–99)
Potassium: 3.6 mmol/L (ref 3.5–5.1)
Sodium: 137 mmol/L (ref 135–145)
Total Bilirubin: 1.7 mg/dL — ABNORMAL HIGH (ref 0.0–1.2)
Total Protein: 6.7 g/dL (ref 6.5–8.1)

## 2024-06-17 LAB — CBC WITH DIFFERENTIAL/PLATELET
Abs Immature Granulocytes: 0.01 10*3/uL (ref 0.00–0.07)
Basophils Absolute: 0.1 10*3/uL (ref 0.0–0.1)
Basophils Relative: 1 %
Eosinophils Absolute: 0.1 10*3/uL (ref 0.0–0.5)
Eosinophils Relative: 2 %
HCT: 34.3 % — ABNORMAL LOW (ref 36.0–46.0)
Hemoglobin: 10.9 g/dL — ABNORMAL LOW (ref 12.0–15.0)
Immature Granulocytes: 0 %
Lymphocytes Relative: 27 %
Lymphs Abs: 1.9 10*3/uL (ref 0.7–4.0)
MCH: 29.3 pg (ref 26.0–34.0)
MCHC: 31.8 g/dL (ref 30.0–36.0)
MCV: 92.2 fL (ref 80.0–100.0)
Monocytes Absolute: 0.5 10*3/uL (ref 0.1–1.0)
Monocytes Relative: 7 %
Neutro Abs: 4.4 10*3/uL (ref 1.7–7.7)
Neutrophils Relative %: 63 %
Platelets: 251 10*3/uL (ref 150–400)
RBC: 3.72 MIL/uL — ABNORMAL LOW (ref 3.87–5.11)
RDW: 13.6 % (ref 11.5–15.5)
WBC: 7 10*3/uL (ref 4.0–10.5)
nRBC: 0 % (ref 0.0–0.2)

## 2024-06-17 MED ORDER — FENTANYL CITRATE PF 50 MCG/ML IJ SOSY
50.0000 ug | PREFILLED_SYRINGE | Freq: Once | INTRAMUSCULAR | Status: AC
  Administered 2024-06-17: 50 ug via INTRAVENOUS
  Filled 2024-06-17: qty 1

## 2024-06-17 NOTE — Progress Notes (Signed)
 VASCULAR LAB    Bilateral lower extremity venous duplex has been performed.  See CV proc for preliminary results.  Gave verbal report to Margit Paris, PA-C  Izac Faulkenberry, RVT 06/17/2024, 6:28 PM

## 2024-06-17 NOTE — ED Triage Notes (Signed)
 Per EMS from home. Increased pain and swelling in BLE. Skin irritation on bilateral posterior lower extremities. Hx of CHF.  BP 146/63 HR 75 95 on RA RR 17 CBG 120 T 97.1

## 2024-06-17 NOTE — ED Provider Notes (Signed)
 [place] EMERGENCY DEPARTMENT AT [facility] Provider Note   CSN: [id number] Arrival date & time: 06/17/24  1402     Patient presents with: Leg Pain and Leg Swelling   Maria [name] is a 68 y.o. female.  {Add pertinent medical, surgical, social history, OB history to YEP:67052} Patient to ED for evaluation of 3 days of pain behind both knees extending into thighs. No increased swelling. No fever. She denies CP and SOB. No history of DVT or clots. No injury.   The history is provided by the patient. No language interpreter was used.  Leg Pain      Prior to Admission medications   Medication Sig Start Date End Date Taking? Authorizing Provider  albuterol  (VENTOLIN  HFA) 108 (90 Base) MCG/ACT inhaler Inhale 1 puff into the lungs every 6 (six) hours as needed for wheezing or shortness of breath.    [provider]  cholecalciferol  (VITAMIN D3) 25 MCG (1000 UNIT) tablet Take 1,000 Units by mouth daily.    [provider]  clonazePAM  (KLONOPIN ) 1 MG tablet Take 1 tablet (1 mg total) by mouth 2 (two) times daily as needed for up to 5 days. for anxiety Patient taking differently: Take 1 mg by mouth 2 (two) times daily as needed for anxiety. 02/02/24 04/26/24  Raenelle Coria, MD  cyanocobalamin  1000 MCG tablet Take 1 tablet (1,000 mcg total) by mouth daily. 07/18/23   [name], MD  famotidine  (PEPCID ) 40 MG tablet Take 40 mg by mouth at bedtime. 10/04/23   [provider]  fluticasone  (FLONASE ) 50 MCG/ACT nasal spray Place 1 spray into both nostrils 2 (two) times daily as needed for rhinitis or allergies. 12/07/22   [provider]  Metamucil Fiber CHEW Chew 3 each by mouth at bedtime as needed (to increase fiber intake).    [provider]  metoprolol  succinate (TOPROL -XL) 25 MG 24 hr tablet Take 1 tablet (25 mg total) by mouth daily. 07/18/23   [name], MD  NARCAN  4 MG/0.1ML LIQD nasal spray kit Place 0.4 mg into the  nose daily as needed (opioid overdose). 04/25/19   [provider]  nystatin  (MYCOSTATIN /NYSTOP ) powder Apply 1 Application topically daily as needed (for irritation). 01/22/24   [provider]  ondansetron  (ZOFRAN -ODT) 4 MG disintegrating tablet Take 4 mg by mouth every 8 (eight) hours as needed for nausea or vomiting (dissolve the tablet orally). 09/14/23   [provider]  Oxycodone  HCl 10 MG TABS Take 10 mg by mouth every 8 (eight) hours.    [provider]  pantoprazole  (PROTONIX ) 40 MG tablet TAKE ONE TABLET BY MOUTH DAILY Patient taking differently: Take 40 mg by mouth daily before breakfast. 01/29/22   [name], MD  Potassium Chloride  ER 20 MEQ TBCR Take 40 mEq by mouth in the morning. 11/16/23   [provider]  pramipexole  (MIRAPEX ) 0.5 MG tablet Take 0.5 mg by mouth at bedtime. 12/16/23   [provider]  sodium chloride  (OCEAN NASAL SPRAY) 0.65 % nasal spray Place 1 spray into the nose as needed for congestion.    [provider]  spironolactone  (ALDACTONE ) 25 MG tablet Take 1 tablet (25 mg total) by mouth daily. 05/03/24   Rojelio Nest, DO  tirzepatide (MOUNJARO) 15 MG/0.5ML Pen Inject 15 mg into the skin every Wednesday.    [provider]  torsemide  (DEMADEX ) 20 MG tablet Take 2 tablets (40 mg total) by mouth daily. Patient taking differently: Take 40  mg by mouth in the morning. 11/30/23   [name], MD  TYLENOL  8 HOUR ARTHRITIS PAIN 650 MG CR tablet Take 1,300 mg by mouth in the morning and at bedtime.    [provider]    Allergies: Amoxicillin -pot clavulanate, Adhesive [tape], Codeine, Crestor  [rosuvastatin  calcium ], Cyclobenzaprine , Cymbalta  [duloxetine  hcl], Dilaudid  [hydromorphone ], Gabapentin , Lyrica [pregabalin], Morphine  and codeine, Vicodin [hydrocodone-acetaminophen ], and Latex    Review of Systems  Updated Vital Signs BP (!) 144/53 (BP Location: Right Arm)   Pulse 71   Temp 97.8 F  (36.6 C) (Oral)   Resp 16   SpO2 99%   Physical Exam Vitals and nursing note reviewed.  Constitutional:      General: She is not in acute distress.    Appearance: She is well-developed. She is not ill-appearing.   Cardiovascular:     Rate and Rhythm: Normal rate.  Pulmonary:     Effort: Pulmonary effort is normal.   Musculoskeletal:        General: Normal range of motion.     Cervical back: Normal range of motion.     Comments: Bilateral chronic LE swelling. No erythema. Tender posterior knees, calves and distal medial thighs bilaterally. Distal pulses present.    Skin:    General: Skin is warm and dry.   Neurological:     Mental Status: She is alert and oriented to person, place, and time.   (all labs ordered are listed, but only abnormal results are displayed) Labs Reviewed  CBC WITH DIFFERENTIAL/PLATELET  COMPREHENSIVE METABOLIC PANEL WITH GFR    EKG: None  Radiology: No results found.  {Document cardiac monitor, telemetry assessment procedure when appropriate:32947} Procedures   Medications Ordered in the ED  fentaNYL  (SUBLIMAZE ) injection 50 mcg (has no administration in time range)      {Click here for ABCD2, HEART and other calculators REFRESH Note before signing:1}                              Medical Decision Making Amount and/or Complexity of Data Reviewed Labs: ordered.  Risk Prescription drug management.   ***  {Document critical care time when appropriate  Document review of labs and clinical decision tools ie CHADS2VASC2, etc  Document your independent review of radiology images and any outside records  Document your discussion with family members, caretakers and with consultants  Document social determinants of health affecting pt's care  Document your decision making why or why not admission, treatments were needed:32947:::1}   Final diagnoses:  None    ED Discharge Orders     None

## 2024-06-17 NOTE — Discharge Instructions (Signed)
 As we discussed, your doppler studies were negative for clots. And there is no evidence of infection or other acute process as the cause of your leg pain. You can be discharged home and should follow up with your doctor for recheck this week.

## 2024-06-19 ENCOUNTER — Encounter: Payer: Self-pay | Admitting: Physician Assistant
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
## 2024-07-07 ENCOUNTER — Encounter: Payer: Self-pay | Admitting: Physician Assistant

## 2024-07-07 ENCOUNTER — Encounter

## 2024-07-07 ENCOUNTER — Other Ambulatory Visit

## 2024-07-07 ENCOUNTER — Ambulatory Visit (INDEPENDENT_AMBULATORY_CARE_PROVIDER_SITE_OTHER): Payer: Self-pay | Admitting: Physician Assistant

## 2024-07-07 VITALS — BP 125/72 | HR 72 | Resp 20 | Ht 63.0 in | Wt 309.0 lb

## 2024-07-07 DIAGNOSIS — R413 Other amnesia: Secondary | ICD-10-CM

## 2024-07-07 DIAGNOSIS — R443 Hallucinations, unspecified: Secondary | ICD-10-CM | POA: Diagnosis not present

## 2024-07-07 NOTE — Patient Instructions (Addendum)
 It was a pleasure to see you today at our office.   Recommendations:  Neurocognitive evaluation at our office   MRI of the brain, the radiology office will call you to arrange you appointment  [phone] Check labs today  [address] Follow up after neuropsych evaluation   Continue counseling    [email or web address]   RECOMMENDATIONS FOR ALL PATIENTS WITH MEMORY PROBLEMS: 1. Continue to exercise (Recommend 30 minutes of walking everyday, or 3 hours every week) 2. Increase social interactions - continue going to [place] and enjoy social gatherings with friends and family 3. Eat healthy, avoid fried foods and eat more fruits and vegetables 4. Maintain adequate blood pressure, blood sugar, and blood cholesterol level. Reducing the risk of stroke and cardiovascular disease also helps promoting better memory. 5. Avoid stressful situations. Live a simple life and avoid aggravations. Organize your time and prepare for the next day in anticipation. 6. Sleep well, avoid any interruptions of sleep and avoid any distractions in the bedroom that may interfere with adequate sleep quality 7. Avoid sugar, avoid sweets as there is a strong link between excessive sugar intake, diabetes, and cognitive impairment We discussed the Mediterranean diet, which has been shown to help patients reduce the risk of progressive memory disorders and reduces cardiovascular risk. This includes eating fish, eat fruits and green leafy vegetables, nuts like almonds and hazelnuts, walnuts, and also use olive oil. Avoid fast foods and fried foods as much as possible. Avoid sweets and sugar as sugar use has been linked to worsening of memory function.  There is always a concern of gradual progression of memory problems. If this is the case, then we may need to adjust level of care according to patient needs. Support, both to the patient and caregiver, should then be put into  place.      You have been referred for a neuropsychological evaluation (i.e., evaluation of memory and thinking abilities). Please bring someone with you to this appointment if possible, as it is helpful for the doctor to hear from both you and another adult who knows you well. Please bring eyeglasses and hearing aids if you wear them.    The evaluation will take approximately 3 hours and has two parts:   The first part is a clinical interview with the neuropsychologist (Dr. Richie or Dr. Gayland). During the interview, the neuropsychologist will speak with you and the individual you brought to the appointment.    The second part of the evaluation is testing with the doctor's technician Neal or Luke). During the testing, the technician will ask you to remember different types of material, solve problems, and answer some questionnaires. Your family member will not be present for this portion of the evaluation.   Please note: We must reserve several hours of the neuropsychologist's time and the psychometrician's time for your evaluation appointment. As such, there is a No-Show fee of $100. If you are unable to attend any of your appointments, please contact our office as soon as possible to reschedule.      DRIVING: Regarding driving, in patients with progressive memory problems, driving will be impaired. We advise to have someone else do the driving if trouble finding directions or if minor accidents are reported. Independent driving assessment is available to determine safety of driving.   If you are interested in the driving assessment, you can contact the following:  [facility] in [place] [phone]  [facility] [phone]  [facility] [phone]  Rehab [phone] or [phone]   FALL PRECAUTIONS: Be cautious when walking. Scan the area for obstacles that may increase the risk of trips and falls. When getting up in the  mornings, sit up at the edge of the bed for a few minutes before getting out of bed. Consider elevating the bed at the head end to avoid drop of blood pressure when getting up. Walk always in a well-lit room (use night lights in the walls). Avoid area rugs or power cords from appliances in the middle of the walkways. Use a walker or a cane if necessary and consider physical therapy for balance exercise. Get your eyesight checked regularly.  FINANCIAL OVERSIGHT: Supervision, especially oversight when making financial decisions or transactions is also recommended.  HOME SAFETY: Consider the safety of the kitchen when operating appliances like stoves, microwave oven, and blender. Consider having supervision and share cooking responsibilities until no longer able to participate in those. Accidents with firearms and other hazards in the house should be identified and addressed as well.   ABILITY TO BE LEFT ALONE: If patient is unable to contact 911 operator, consider using LifeLine, or when the need is there, arrange for someone to stay with patients. Smoking is a fire hazard, consider supervision or cessation. Risk of wandering should be assessed by caregiver and if detected at any point, supervision and safe proof recommendations should be instituted.  MEDICATION SUPERVISION: Inability to self-administer medication needs to be constantly addressed. Implement a mechanism to ensure safe administration of the medications.      Mediterranean Diet A Mediterranean diet refers to food and lifestyle choices that are based on the traditions of countries located on the [employer]. This way of eating has been shown to help prevent certain conditions and improve outcomes for people who have chronic diseases, like kidney disease and heart disease. What are tips for following this plan? Lifestyle  Cook and eat meals together with your family, when possible. Drink enough fluid to keep your urine clear or  pale yellow. Be physically active every day. This includes: Aerobic exercise like running or swimming. Leisure activities like gardening, walking, or housework. Get 7-8 hours of sleep each night. If recommended by your health care provider, drink red wine in moderation. This means 1 glass a day for nonpregnant women and 2 glasses a day for men. A glass of wine equals 5 oz (150 mL). Reading food labels  Check the serving size of packaged foods. For foods such as rice and pasta, the serving size refers to the amount of cooked product, not dry. Check the total fat in packaged foods. Avoid foods that have saturated fat or trans fats. Check the ingredients list for added sugars, such as corn syrup. Shopping  At the grocery store, buy most of your food from the areas near the walls of the store. This includes: Fresh fruits and vegetables (produce). Grains, beans, nuts, and seeds. Some of these may be available in unpackaged forms or large amounts (in bulk). Fresh seafood. Poultry and eggs. Low-fat dairy products. Buy whole ingredients instead of prepackaged foods. Buy fresh fruits and vegetables in-season from local farmers markets. Buy frozen fruits and vegetables in resealable bags. If you do not have access to quality fresh seafood, buy precooked frozen shrimp or canned fish, such as tuna, salmon, or sardines. Buy small amounts of raw or cooked vegetables, salads, or olives from the deli or salad bar at your store. Stock your pantry so you always have certain foods  on hand, such as olive oil, canned tuna, canned tomatoes, rice, pasta, and beans. Cooking  Cook foods with extra-virgin olive oil instead of using butter or other vegetable oils. Have meat as a side dish, and have vegetables or grains as your main dish. This means having meat in small portions or adding small amounts of meat to foods like pasta or stew. Use beans or vegetables instead of meat in common dishes like chili or  lasagna. Experiment with different cooking methods. Try roasting or broiling vegetables instead of steaming or sauteing them. Add frozen vegetables to soups, stews, pasta, or rice. Add nuts or seeds for added healthy fat at each meal. You can add these to yogurt, salads, or vegetable dishes. Marinate fish or vegetables using olive oil, lemon juice, garlic, and fresh herbs. Meal planning  Plan to eat 1 vegetarian meal one day each week. Try to work up to 2 vegetarian meals, if possible. Eat seafood 2 or more times a week. Have healthy snacks readily available, such as: Vegetable sticks with hummus. Greek yogurt. Fruit and nut trail mix. Eat balanced meals throughout the week. This includes: Fruit: 2-3 servings a day Vegetables: 4-5 servings a day Low-fat dairy: 2 servings a day Fish, poultry, or lean meat: 1 serving a day Beans and legumes: 2 or more servings a week Nuts and seeds: 1-2 servings a day Whole grains: 6-8 servings a day Extra-virgin olive oil: 3-4 servings a day Limit red meat and sweets to only a few servings a month What are my food choices? Mediterranean diet Recommended Grains: Whole-grain pasta. Brown rice. Bulgar wheat. Polenta. Couscous. Whole-wheat bread. [place] Made[place]. Vegetables: Artichokes. Beets. Broccoli. Cabbage. Carrots. Eggplant. Green beans. Chard. Kale. Spinach. Onions. Leeks. Peas. Squash. Tomatoes. Peppers. Radishes. Fruits: Apples. Apricots. Avocado. Berries. Bananas. Cherries. Dates. Figs. Grapes. Lemons. Melon. Oranges. Peaches. Plums. Pomegranate. Meats and other protein foods: Beans. Almonds. Sunflower seeds. Pine nuts. Peanuts. Cod. Salmon. Scallops. Shrimp. Tuna. Tilapia. Clams. Oysters. Eggs. Dairy: Low-fat milk. Cheese. Greek yogurt. Beverages: Water. Red wine. Herbal tea. Fats and oils: Extra virgin olive oil. Avocado oil. Grape seed oil. Sweets and desserts: [place] yogurt with honey. Baked apples. Poached pears. Trail mix. Seasoning and  other foods: Basil. Cilantro. Coriander. Cumin. Mint. Parsley. Sage. Rosemary. Tarragon. Garlic. Oregano. Thyme. Pepper. Balsalmic vinegar. Tahini. Hummus. Tomato sauce. Olives. Mushrooms. Limit these Grains: Prepackaged pasta or rice dishes. Prepackaged cereal with added sugar. Vegetables: Deep fried potatoes (french fries). Fruits: Fruit canned in syrup. Meats and other protein foods: Beef. Pork. Lamb. Poultry with skin. Hot dogs. [place]. Dairy: Ice cream. Sour cream. Whole milk. Beverages: Juice. Sugar-sweetened soft drinks. Beer. Liquor and spirits. Fats and oils: Butter. Canola oil. Vegetable oil. Beef fat (tallow). Lard. Sweets and desserts: Cookies. Cakes. Pies. Candy. Seasoning and other foods: Mayonnaise. Premade sauces and marinades. The items listed may not be a complete list. Talk with your dietitian about what dietary choices are right for you. Summary The Mediterranean diet includes both food and lifestyle choices. Eat a variety of fresh fruits and vegetables, beans, nuts, seeds, and whole grains. Limit the amount of red meat and sweets that you eat. Talk with your health care provider about whether it is safe for you to drink red wine in moderation. This means 1 glass a day for nonpregnant women and 2 glasses a day for men. A glass of wine equals 5 oz (150 mL). This information is not intended to replace advice given to you by your health care provider. Make  sure you discuss any questions you have with your health care provider. Document Released: 07/30/2016 Document Revised: 09/01/2016 Document Reviewed: 07/30/2016 Elsevier Interactive Patient Education  2017 [employer].

## 2024-07-08 LAB — VITAMIN B12: Vitamin B-12: 613 pg/mL (ref 200–1100)

## 2024-07-08 LAB — TSH: TSH: 2.32 m[IU]/L (ref 0.40–4.50)

## 2024-07-10 ENCOUNTER — Encounter: Payer: Self-pay | Admitting: Psychology

## 2024-07-10 ENCOUNTER — Ambulatory Visit: Payer: Self-pay | Admitting: Physician Assistant

## 2024-07-24 ENCOUNTER — Ambulatory Visit
Admission: RE | Admit: 2024-07-24 | Discharge: 2024-07-24 | Disposition: A | Discharge status: Home / Self Care | Source: Ambulatory Visit | Attending: Physician Assistant | Admitting: Physician Assistant

## 2024-07-25 ENCOUNTER — Other Ambulatory Visit: Payer: Self-pay

## 2024-07-25 ENCOUNTER — Telehealth: Payer: Self-pay | Admitting: Physician Assistant

## 2024-07-25 DIAGNOSIS — K111 Hypertrophy of salivary gland: Secondary | ICD-10-CM

## 2024-07-25 LAB — HM DIABETES EYE EXAM

## 2024-07-25 NOTE — Telephone Encounter (Signed)
 Patient advised of MRI,ENT ordered placed and patient thanked me for calling.

## 2024-07-25 NOTE — Telephone Encounter (Signed)
 Tiffany from Radiology called in this morning and she wanted to know if we received the MRI results of the Brain of patient they sent over. Annabella 's number is [phone]

## 2024-09-01 ENCOUNTER — Encounter (HOSPITAL_COMMUNITY): Payer: Self-pay

## 2024-09-01 ENCOUNTER — Other Ambulatory Visit: Payer: Self-pay

## 2024-09-01 ENCOUNTER — Inpatient Hospital Stay (HOSPITAL_COMMUNITY)
Admission: EM | Admit: 2024-09-01 | Discharge: 2024-09-13 | DRG: 472 | Disposition: A | Discharge status: Skilled Nursing Facility | Attending: Family Medicine | Admitting: Family Medicine

## 2024-09-01 ENCOUNTER — Emergency Department (HOSPITAL_COMMUNITY)

## 2024-09-01 DIAGNOSIS — E1151 Type 2 diabetes mellitus with diabetic peripheral angiopathy without gangrene: Secondary | ICD-10-CM | POA: Diagnosis present

## 2024-09-01 DIAGNOSIS — Z885 Allergy status to narcotic agent status: Secondary | ICD-10-CM

## 2024-09-01 DIAGNOSIS — M48061 Spinal stenosis, lumbar region without neurogenic claudication: Secondary | ICD-10-CM | POA: Diagnosis present

## 2024-09-01 DIAGNOSIS — Z87891 Personal history of nicotine dependence: Secondary | ICD-10-CM

## 2024-09-01 DIAGNOSIS — N3000 Acute cystitis without hematuria: Secondary | ICD-10-CM | POA: Diagnosis not present

## 2024-09-01 DIAGNOSIS — E860 Dehydration: Secondary | ICD-10-CM | POA: Diagnosis present

## 2024-09-01 DIAGNOSIS — N390 Urinary tract infection, site not specified: Secondary | ICD-10-CM | POA: Diagnosis present

## 2024-09-01 DIAGNOSIS — I272 Pulmonary hypertension, unspecified: Secondary | ICD-10-CM | POA: Diagnosis present

## 2024-09-01 DIAGNOSIS — M4802 Spinal stenosis, cervical region: Secondary | ICD-10-CM | POA: Diagnosis not present

## 2024-09-01 DIAGNOSIS — Z5986 Financial insecurity: Secondary | ICD-10-CM

## 2024-09-01 DIAGNOSIS — Z8249 Family history of ischemic heart disease and other diseases of the circulatory system: Secondary | ICD-10-CM

## 2024-09-01 DIAGNOSIS — M5030 Other cervical disc degeneration, unspecified cervical region: Secondary | ICD-10-CM | POA: Diagnosis present

## 2024-09-01 DIAGNOSIS — I5033 Acute on chronic diastolic (congestive) heart failure: Secondary | ICD-10-CM | POA: Diagnosis not present

## 2024-09-01 DIAGNOSIS — I10 Essential (primary) hypertension: Secondary | ICD-10-CM | POA: Diagnosis not present

## 2024-09-01 DIAGNOSIS — E1165 Type 2 diabetes mellitus with hyperglycemia: Secondary | ICD-10-CM | POA: Diagnosis not present

## 2024-09-01 DIAGNOSIS — I5032 Chronic diastolic (congestive) heart failure: Secondary | ICD-10-CM | POA: Diagnosis present

## 2024-09-01 DIAGNOSIS — M50221 Other cervical disc displacement at C4-C5 level: Secondary | ICD-10-CM | POA: Diagnosis present

## 2024-09-01 DIAGNOSIS — Z79899 Other long term (current) drug therapy: Secondary | ICD-10-CM

## 2024-09-01 DIAGNOSIS — K219 Gastro-esophageal reflux disease without esophagitis: Secondary | ICD-10-CM | POA: Diagnosis present

## 2024-09-01 DIAGNOSIS — I110 Hypertensive heart disease with heart failure: Secondary | ICD-10-CM | POA: Diagnosis present

## 2024-09-01 DIAGNOSIS — Z6841 Body Mass Index (BMI) 40.0 and over, adult: Secondary | ICD-10-CM

## 2024-09-01 DIAGNOSIS — M4322 Fusion of spine, cervical region: Secondary | ICD-10-CM | POA: Diagnosis not present

## 2024-09-01 DIAGNOSIS — Z7985 Long-term (current) use of injectable non-insulin antidiabetic drugs: Secondary | ICD-10-CM

## 2024-09-01 DIAGNOSIS — Z8041 Family history of malignant neoplasm of ovary: Secondary | ICD-10-CM

## 2024-09-01 DIAGNOSIS — Z602 Problems related to living alone: Secondary | ICD-10-CM | POA: Diagnosis present

## 2024-09-01 DIAGNOSIS — G959 Disease of spinal cord, unspecified: Secondary | ICD-10-CM | POA: Diagnosis not present

## 2024-09-01 DIAGNOSIS — R262 Difficulty in walking, not elsewhere classified: Secondary | ICD-10-CM | POA: Diagnosis not present

## 2024-09-01 DIAGNOSIS — E785 Hyperlipidemia, unspecified: Secondary | ICD-10-CM | POA: Diagnosis present

## 2024-09-01 DIAGNOSIS — M25561 Pain in right knee: Secondary | ICD-10-CM | POA: Diagnosis present

## 2024-09-01 DIAGNOSIS — Z91040 Latex allergy status: Secondary | ICD-10-CM

## 2024-09-01 DIAGNOSIS — G894 Chronic pain syndrome: Secondary | ICD-10-CM | POA: Diagnosis present

## 2024-09-01 DIAGNOSIS — F419 Anxiety disorder, unspecified: Secondary | ICD-10-CM | POA: Diagnosis present

## 2024-09-01 DIAGNOSIS — E1169 Type 2 diabetes mellitus with other specified complication: Secondary | ICD-10-CM | POA: Diagnosis not present

## 2024-09-01 DIAGNOSIS — Z880 Allergy status to penicillin: Secondary | ICD-10-CM

## 2024-09-01 DIAGNOSIS — I3139 Other pericardial effusion (noninflammatory): Secondary | ICD-10-CM | POA: Diagnosis present

## 2024-09-01 DIAGNOSIS — N179 Acute kidney failure, unspecified: Secondary | ICD-10-CM | POA: Diagnosis present

## 2024-09-01 DIAGNOSIS — Z818 Family history of other mental and behavioral disorders: Secondary | ICD-10-CM

## 2024-09-01 DIAGNOSIS — F32A Depression, unspecified: Secondary | ICD-10-CM | POA: Diagnosis present

## 2024-09-01 DIAGNOSIS — M25562 Pain in left knee: Secondary | ICD-10-CM | POA: Diagnosis present

## 2024-09-01 DIAGNOSIS — Z888 Allergy status to other drugs, medicaments and biological substances status: Secondary | ICD-10-CM

## 2024-09-01 DIAGNOSIS — Z833 Family history of diabetes mellitus: Secondary | ICD-10-CM

## 2024-09-01 DIAGNOSIS — R531 Weakness: Secondary | ICD-10-CM | POA: Diagnosis not present

## 2024-09-01 DIAGNOSIS — G992 Myelopathy in diseases classified elsewhere: Secondary | ICD-10-CM | POA: Diagnosis present

## 2024-09-01 DIAGNOSIS — Z751 Person awaiting admission to adequate facility elsewhere: Secondary | ICD-10-CM

## 2024-09-01 DIAGNOSIS — E66813 Obesity, class 3: Secondary | ICD-10-CM | POA: Diagnosis present

## 2024-09-01 DIAGNOSIS — H532 Diplopia: Secondary | ICD-10-CM | POA: Diagnosis not present

## 2024-09-01 DIAGNOSIS — Z825 Family history of asthma and other chronic lower respiratory diseases: Secondary | ICD-10-CM

## 2024-09-01 DIAGNOSIS — Z8614 Personal history of Methicillin resistant Staphylococcus aureus infection: Secondary | ICD-10-CM

## 2024-09-01 DIAGNOSIS — Z800 Family history of malignant neoplasm of digestive organs: Secondary | ICD-10-CM

## 2024-09-01 LAB — URINALYSIS, ROUTINE W REFLEX MICROSCOPIC
Bilirubin Urine: NEGATIVE
Glucose, UA: NEGATIVE mg/dL
Ketones, ur: NEGATIVE mg/dL
Nitrite: NEGATIVE
Protein, ur: NEGATIVE mg/dL
Specific Gravity, Urine: 1.005 (ref 1.005–1.030)
pH: 6 (ref 5.0–8.0)

## 2024-09-01 LAB — CBC WITH DIFFERENTIAL/PLATELET
Abs Immature Granulocytes: 0.02 K/uL (ref 0.00–0.07)
Basophils Absolute: 0.1 K/uL (ref 0.0–0.1)
Basophils Relative: 1 %
Eosinophils Absolute: 0.2 K/uL (ref 0.0–0.5)
Eosinophils Relative: 2 %
HCT: 35.1 % — ABNORMAL LOW (ref 36.0–46.0)
Hemoglobin: 11 g/dL — ABNORMAL LOW (ref 12.0–15.0)
Immature Granulocytes: 0 %
Lymphocytes Relative: 32 %
Lymphs Abs: 2.6 K/uL (ref 0.7–4.0)
MCH: 29.6 pg (ref 26.0–34.0)
MCHC: 31.3 g/dL (ref 30.0–36.0)
MCV: 94.4 fL (ref 80.0–100.0)
Monocytes Absolute: 0.7 K/uL (ref 0.1–1.0)
Monocytes Relative: 9 %
Neutro Abs: 4.7 K/uL (ref 1.7–7.7)
Neutrophils Relative %: 56 %
Platelets: 241 K/uL (ref 150–400)
RBC: 3.72 MIL/uL — ABNORMAL LOW (ref 3.87–5.11)
RDW: 13.3 % (ref 11.5–15.5)
WBC: 8.3 K/uL (ref 4.0–10.5)
nRBC: 0 % (ref 0.0–0.2)

## 2024-09-01 LAB — GLUCOSE, CAPILLARY
Glucose-Capillary: 83 mg/dL (ref 70–99)
Glucose-Capillary: 84 mg/dL (ref 70–99)
Glucose-Capillary: 91 mg/dL (ref 70–99)

## 2024-09-01 LAB — BASIC METABOLIC PANEL WITH GFR
Anion gap: 11 (ref 5–15)
BUN: 11 mg/dL (ref 8–23)
CO2: 27 mmol/L (ref 22–32)
Calcium: 9.1 mg/dL (ref 8.9–10.3)
Chloride: 101 mmol/L (ref 98–111)
Creatinine, Ser: 1.08 mg/dL — ABNORMAL HIGH (ref 0.44–1.00)
GFR, Estimated: 56 mL/min — ABNORMAL LOW (ref >60)
Glucose, Bld: 94 mg/dL (ref 70–99)
Potassium: 3.7 mmol/L (ref 3.5–5.1)
Sodium: 139 mmol/L (ref 135–145)

## 2024-09-01 LAB — CBG MONITORING, ED: Glucose-Capillary: 88 mg/dL (ref 70–99)

## 2024-09-01 LAB — HEMOGLOBIN A1C
Hgb A1c MFr Bld: 5 % (ref 4.8–5.6)
Mean Plasma Glucose: 96.8 mg/dL

## 2024-09-01 MED ORDER — SPIRONOLACTONE 25 MG PO TABS
25.0000 mg | ORAL_TABLET | Freq: Every day | ORAL | Status: DC
Start: 2024-09-01 — End: 2024-09-02
  Administered 2024-09-01: 25 mg via ORAL
  Filled 2024-09-01: qty 1

## 2024-09-01 MED ORDER — INSULIN ASPART 100 UNIT/ML IJ SOLN: 0.0000 [IU] | Freq: Every day | INTRAMUSCULAR | Status: DC

## 2024-09-01 MED ORDER — OXYCODONE-ACETAMINOPHEN 5-325 MG PO TABS
2.0000 | ORAL_TABLET | Freq: Once | ORAL | Status: AC
  Administered 2024-09-01: 2 via ORAL
  Filled 2024-09-01: qty 2

## 2024-09-01 MED ORDER — ALBUTEROL SULFATE (2.5 MG/3ML) 0.083% IN NEBU: 2.5000 mg | INHALATION_SOLUTION | RESPIRATORY_TRACT | Status: DC | PRN

## 2024-09-01 MED ORDER — ACETAMINOPHEN 650 MG RE SUPP: 650.0000 mg | Freq: Four times a day (QID) | RECTAL | Status: DC | PRN

## 2024-09-01 MED ORDER — TIZANIDINE HCL 4 MG PO TABS
2.0000 mg | ORAL_TABLET | Freq: Four times a day (QID) | ORAL | Status: DC | PRN
  Administered 2024-09-01 – 2024-09-02 (×3): 2 mg via ORAL
  Filled 2024-09-01 (×4): qty 1

## 2024-09-01 MED ORDER — SODIUM CHLORIDE 0.9 % IV SOLN
1.0000 g | INTRAVENOUS | Status: AC
  Administered 2024-09-02 – 2024-09-04 (×3): 1 g via INTRAVENOUS
  Filled 2024-09-01 (×3): qty 10

## 2024-09-01 MED ORDER — PRAMIPEXOLE DIHYDROCHLORIDE 0.25 MG PO TABS
0.5000 mg | ORAL_TABLET | Freq: Three times a day (TID) | ORAL | Status: DC
  Administered 2024-09-01 – 2024-09-13 (×36): 0.5 mg via ORAL
  Filled 2024-09-01 (×42): qty 2

## 2024-09-01 MED ORDER — MORPHINE SULFATE (PF) 2 MG/ML IV SOLN: 1.0000 mg | INTRAVENOUS | Status: DC | PRN

## 2024-09-01 MED ORDER — OXYCODONE HCL 5 MG PO TABS: 10.0000 mg | ORAL_TABLET | Freq: Three times a day (TID) | ORAL | Status: DC | PRN

## 2024-09-01 MED ORDER — OXYCODONE HCL 5 MG PO TABS
10.0000 mg | ORAL_TABLET | Freq: Four times a day (QID) | ORAL | Status: DC | PRN
  Administered 2024-09-01 – 2024-09-03 (×5): 10 mg via ORAL
  Filled 2024-09-01 (×5): qty 2

## 2024-09-01 MED ORDER — TRAZODONE HCL 50 MG PO TABS
25.0000 mg | ORAL_TABLET | Freq: Every evening | ORAL | Status: DC | PRN
  Administered 2024-09-03 – 2024-09-11 (×6): 25 mg via ORAL
  Filled 2024-09-01 (×7): qty 1

## 2024-09-01 MED ORDER — ENOXAPARIN SODIUM 40 MG/0.4ML IJ SOSY
40.0000 mg | PREFILLED_SYRINGE | Freq: Every day | INTRAMUSCULAR | Status: DC
  Administered 2024-09-01 – 2024-09-06 (×6): 40 mg via SUBCUTANEOUS
  Filled 2024-09-01 (×6): qty 0.4

## 2024-09-01 MED ORDER — ONDANSETRON HCL 4 MG/2ML IJ SOLN
4.0000 mg | Freq: Four times a day (QID) | INTRAMUSCULAR | Status: DC | PRN
  Filled 2024-09-01: qty 2

## 2024-09-01 MED ORDER — TORSEMIDE 20 MG PO TABS
40.0000 mg | ORAL_TABLET | Freq: Every morning | ORAL | Status: DC
Start: 2024-09-01 — End: 2024-09-02
  Administered 2024-09-01: 40 mg via ORAL
  Filled 2024-09-01 (×2): qty 2

## 2024-09-01 MED ORDER — NYSTATIN 100000 UNIT/GM EX POWD
1.0000 | Freq: Every day | CUTANEOUS | Status: DC
  Administered 2024-09-01 – 2024-09-13 (×13): 1 via TOPICAL
  Filled 2024-09-01 (×2): qty 15

## 2024-09-01 MED ORDER — HYDROMORPHONE HCL 1 MG/ML IJ SOLN
1.0000 mg | Freq: Once | INTRAMUSCULAR | Status: AC
  Administered 2024-09-01: 1 mg via INTRAVENOUS
  Filled 2024-09-01: qty 1

## 2024-09-01 MED ORDER — POTASSIUM CHLORIDE CRYS ER 20 MEQ PO TBCR
40.0000 meq | EXTENDED_RELEASE_TABLET | Freq: Every day | ORAL | Status: DC
  Administered 2024-09-01 – 2024-09-06 (×6): 40 meq via ORAL
  Filled 2024-09-01 (×6): qty 2

## 2024-09-01 MED ORDER — FLUTICASONE PROPIONATE 50 MCG/ACT NA SUSP
1.0000 | Freq: Every day | NASAL | Status: DC
  Administered 2024-09-01 – 2024-09-13 (×13): 1 via NASAL
  Filled 2024-09-01 (×2): qty 16

## 2024-09-01 MED ORDER — ONDANSETRON HCL 4 MG PO TABS: 4.0000 mg | ORAL_TABLET | Freq: Four times a day (QID) | ORAL | Status: DC | PRN

## 2024-09-01 MED ORDER — INSULIN ASPART 100 UNIT/ML IJ SOLN
0.0000 [IU] | Freq: Three times a day (TID) | INTRAMUSCULAR | Status: DC
  Administered 2024-09-02: 3 [IU] via SUBCUTANEOUS

## 2024-09-01 MED ORDER — METOPROLOL SUCCINATE ER 25 MG PO TB24
25.0000 mg | ORAL_TABLET | Freq: Every day | ORAL | Status: DC
  Administered 2024-09-01 – 2024-09-13 (×13): 25 mg via ORAL
  Filled 2024-09-01 (×13): qty 1

## 2024-09-01 MED ORDER — CLONAZEPAM 1 MG PO TABS
1.0000 mg | ORAL_TABLET | Freq: Every day | ORAL | Status: DC
  Administered 2024-09-01 – 2024-09-12 (×12): 1 mg via ORAL
  Filled 2024-09-01 (×2): qty 1
  Filled 2024-09-01 (×2): qty 2
  Filled 2024-09-01 (×4): qty 1
  Filled 2024-09-01: qty 2
  Filled 2024-09-01: qty 1
  Filled 2024-09-01 (×2): qty 2

## 2024-09-01 MED ORDER — HYDROMORPHONE HCL 1 MG/ML IJ SOLN
0.5000 mg | INTRAMUSCULAR | Status: DC | PRN
  Administered 2024-09-01 – 2024-09-03 (×5): 0.5 mg via INTRAVENOUS
  Filled 2024-09-01 (×6): qty 0.5

## 2024-09-01 MED ORDER — SODIUM CHLORIDE 0.9 % IV SOLN
1.0000 g | Freq: Once | INTRAVENOUS | Status: AC
  Administered 2024-09-01: 1 g via INTRAVENOUS
  Filled 2024-09-01: qty 10

## 2024-09-01 MED ORDER — ACETAMINOPHEN 325 MG PO TABS
650.0000 mg | ORAL_TABLET | Freq: Four times a day (QID) | ORAL | Status: DC | PRN
  Administered 2024-09-02 – 2024-09-12 (×6): 650 mg via ORAL
  Filled 2024-09-01 (×6): qty 2

## 2024-09-01 MED ORDER — PANTOPRAZOLE SODIUM 40 MG PO TBEC
40.0000 mg | DELAYED_RELEASE_TABLET | Freq: Every day | ORAL | Status: DC
  Administered 2024-09-01 – 2024-09-13 (×12): 40 mg via ORAL
  Filled 2024-09-01 (×11): qty 1

## 2024-09-01 NOTE — H&P (Signed)
 History and Physical  Maria [name] FMW:[id number] DOB: 1956/09/27 DOA: 09/01/2024  PCP: Ileen [name], NP   Chief Complaint: Weakness  HPI: Maria [name] is a 68 y.o. 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  Patient states at baseline she does have some difficulty with ambulation, using a rollator at home and her PCP has been working on setting up home health for her.  She lives by herself in a handicap accessible apartment.  Over the last few days, she describes progressive weakness, mainly feeling like her legs want to give out from under her.  She denies any fevers, chills, nausea, vomiting, diarrhea, or dysuria but she has been having very frequent urination for the last several days.  Review of Systems: Please see HPI for pertinent positives and negatives. A complete 10 system review of systems are otherwise negative.  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 2010    CERVICAL DISC SURGERY  may 24th 2010   COLONOSCOPY WITH PROPOFOL  N/A 07/10/2016   Procedure: COLONOSCOPY WITH PROPOFOL ;  Surgeon: Elspeth Deward Naval, MD;  Location: [facility];  Service: Gastroenterology;  Laterality: N/A;   COLONOSCOPY WITH PROPOFOL  N/A 09/26/2019   Procedure: COLONOSCOPY WITH PROPOFOL ;  Surgeon: Naval Elspeth SQUIBB, MD;  Location: [facility];  Service: Gastroenterology;  Laterality: N/A;   ESOPHAGOGASTRODUODENOSCOPY (EGD) WITH PROPOFOL  N/A 07/10/2016   Procedure: ESOPHAGOGASTRODUODENOSCOPY (EGD) WITH PROPOFOL ;  Surgeon: Elspeth Deward Naval, MD;  Location: [facility];  Service: Gastroenterology;  Laterality: N/A;   POLYPECTOMY  09/26/2019   Procedure: POLYPECTOMY;  Surgeon: Naval Elspeth SQUIBB, MD;  Location: [facility];  Service: Gastroenterology;;   RADIOLOGY WITH ANESTHESIA N/A 07/30/2023   Procedure: MRI WITH ANESTHESIA - THORACIC LUMBAR WITHOUT CONSTRAST;  Surgeon: Radiologist, Medication, MD;  Location: [facility];  Service: Radiology;  Laterality: N/A;   s/p ovary cyst     s/p right knee arthroscopy     Dr. Josefina ortho   Social History:  reports that she quit smoking about 15 years ago. Her smoking use included cigarettes. She started smoking about 45 years ago. She has never used smokeless tobacco. She reports that she does not drink alcohol  and does not use drugs.  Allergies  Allergen Reactions   Amoxicillin -Pot Clavulanate Nausea And Vomiting and Other (See Comments)    Projectile vomiting   Adhesive [Tape] Other (See Comments)    Tears skin off - use paper tape    Codeine Other (See Comments)  hallucinations   Crestor  [Rosuvastatin  Calcium ] Other (See Comments)    Severe muscle cramps   Cyclobenzaprine  Other (See Comments)    Hallucinations    Cymbalta  [Duloxetine  Hcl] Other (See Comments)    Hallucinations    Gabapentin  Other (See Comments)    Trembling   Lexapro [Escitalopram] Other (See Comments)    Hallucinations    Lyrica [Pregabalin]  Other (See Comments)    weakness   Morphine  And Codeine Nausea And Vomiting and Other (See Comments)    Migraine headaches   Vicodin [Hydrocodone-Acetaminophen ] Other (See Comments)    hallucinations   Latex Rash    Family History  Problem Relation Age of Onset   Heart disease Mother    Hypertension Mother    Diabetes Mother    Heart failure Mother    Asthma Sister    Anxiety disorder Sister    Depression Sister    Hypertension Father    Asthma Daughter    Bipolar disorder Daughter    Cancer Maternal Uncle        Hsia   Cancer Other        ovarian   Liver cancer Paternal Grandmother        ????     Prior to Admission medications   Medication Sig Start Date End Date Taking? Authorizing Provider  cholecalciferol  (VITAMIN D3) 25 MCG (1000 UNIT) tablet Take 1,000 Units by mouth daily.   Yes [provider]  ciprofloxacin  (CIPRO ) 500 MG tablet SMARTSIG:1 Tablet(s) By Mouth Every 12 Hours 08/28/24  Yes [provider]  clonazePAM  (KLONOPIN ) 1 MG tablet Take 1 tablet (1 mg total) by mouth 2 (two) times daily as needed for up to 5 days. for anxiety Patient taking differently: Take 1 mg by mouth at bedtime. 02/02/24 09/01/24 Yes Raenelle Coria, MD  cyanocobalamin  1000 MCG tablet Take 1 tablet (1,000 mcg total) by mouth daily. 07/18/23  Yes [name], MD  famotidine  (PEPCID ) 40 MG tablet Take 40 mg by mouth at bedtime. 10/04/23  Yes [provider]  fluticasone  (FLONASE ) 50 MCG/ACT nasal spray Place 1 spray into both nostrils daily. 12/07/22  Yes [provider]  metolazone  (ZAROXOLYN ) 2.5 MG tablet Take 2.5 mg by mouth once a week. 08/28/24  Yes [provider]  metoprolol  succinate (TOPROL -XL) 25 MG 24 hr tablet Take 1 tablet (25 mg total) by mouth daily. 07/18/23  Yes [name], MD  NARCAN  4 MG/0.1ML LIQD nasal spray kit Place 0.4 mg into the nose daily as needed (opioid overdose). 04/25/19  Yes [provider]  nystatin   (MYCOSTATIN /NYSTOP ) powder Apply 1 Application topically daily. 01/22/24  Yes [provider]  ondansetron  (ZOFRAN -ODT) 4 MG disintegrating tablet Take 4 mg by mouth every 8 (eight) hours as needed for nausea or vomiting (dissolve the tablet orally). 09/14/23  Yes [provider]  Oxycodone  HCl 10 MG TABS Take 10 mg by mouth every 6 (six) hours.   Yes [provider]  pantoprazole  (PROTONIX ) 40 MG tablet TAKE ONE TABLET BY MOUTH DAILY Patient taking differently: Take 40 mg by mouth daily before breakfast. 01/29/22  Yes [name], MD  potassium chloride  SA (KLOR-CON  M) 20 MEQ tablet Take 40 mEq by mouth daily. 08/29/24  Yes [provider]  pramipexole  (MIRAPEX ) 0.5 MG tablet Take 0.5 mg by mouth 3 (three) times daily. 12/16/23  Yes [provider]  spironolactone  (ALDACTONE ) 25 MG tablet Take 1 tablet (25 mg total) by mouth daily. 05/03/24  Yes  Rojelio Nest, DO  tirzepatide [facility]) 15 MG/0.5ML Pen Inject 15 mg into the skin every Wednesday.   Yes [provider]  torsemide  (DEMADEX ) 20 MG tablet Take 2 tablets (40 mg total) by mouth daily. Patient taking differently: Take 60 mg by mouth 2 (two) times daily. 11/30/23  Yes [name], MD  TYLENOL  8 HOUR ARTHRITIS PAIN 650 MG CR tablet Take 1,300 mg by mouth at bedtime.   Yes [provider]  albuterol  (VENTOLIN  HFA) 108 (90 Base) MCG/ACT inhaler Inhale 1 puff into the lungs every 6 (six) hours as needed for wheezing or shortness of breath. Patient not taking: Reported on 09/01/2024    [provider]  escitalopram (LEXAPRO) 20 MG tablet Take 20 mg by mouth daily. Patient not taking: Reported on 09/01/2024 08/23/24   [provider]    Physical Exam: BP (!) 135/53 (BP Location: Right Arm)   Pulse 75   Temp 97.7 F (36.5 C)   Resp 16   SpO2 94%  General:  Alert, oriented, calm, in no acute distress, pleasant and cooperative, resting comfortably on room  air Cardiovascular: RRR, no murmurs or rubs, she has nonpitting peripheral edema which she says is stable overall Respiratory: clear to auscultation bilaterally, no wheezes, no crackles  Abdomen: soft, nontender, nondistended, normal bowel tones heard  Skin: dry, no rashes  Musculoskeletal: no joint effusions, normal range of motion  Psychiatric: appropriate affect, normal speech  Neurologic: extraocular muscles intact, clear speech, moving all extremities with intact sensorium         Labs on Admission:  Basic Metabolic Panel: Recent Labs  Lab 09/01/24 0659  NA 139  K 3.7  CL 101  CO2 27  GLUCOSE 94  BUN 11  CREATININE 1.08*  CALCIUM  9.1   Liver Function Tests: No results for input(s): AST, ALT, ALKPHOS, BILITOT, PROT, ALBUMIN  in the last 168 hours. No results for input(s): LIPASE, AMYLASE in the last 168 hours. No results for input(s): AMMONIA in the last 168 hours. CBC: Recent Labs  Lab 09/01/24 0659  WBC 8.3  NEUTROABS 4.7  HGB 11.0*  HCT 35.1*  MCV 94.4  PLT 241   Cardiac Enzymes: No results for input(s): CKTOTAL, CKMB, CKMBINDEX, TROPONINI in the last 168 hours. BNP (last 3 results) Recent Labs    04/26/24 1556 04/27/24 0412 04/27/24 1740  BNP 31.4 29.6 52.8    ProBNP (last 3 results) No results for input(s): PROBNP in the last 8760 hours.  CBG: Recent Labs  Lab 09/01/24 0506  GLUCAP 88    Radiological Exams on Admission: DG Knee Complete 4 Views Left Result Date: 09/01/2024 CLINICAL DATA:  68 year old female with chronic knee pain. EXAM: LEFT KNEE - COMPLETE 4+ VIEW COMPARISON:  Left knee radiographs 12/28/2023. FINDINGS: Four views. Chronic bulky tricompartmental degenerative osteophytosis. Severe medial compartment joint space loss. Lateral view is slightly oblique. Cannot exclude suprapatellar joint effusion. Stable bone mineralization. No acute osseous abnormality identified. IMPRESSION: Chronic tricompartmental  degeneration with severe medial joint space loss. Cannot exclude joint effusion but no acute osseous abnormality identified. Electronically Signed   By: VEAR Hurst M.D.   On: 09/01/2024 06:27   DG Knee Complete 4 Views Right Result Date: 09/01/2024 EXAM: 4 VIEW(S) XRAY OF THE KNEE 09/01/2024 05:55:54 AM COMPARISON: 12/30/2023 CLINICAL HISTORY: Pain. No injury, chronic knee pain, unable to bear weight. FINDINGS: BONES AND JOINTS: No acute fracture. Marginal spurs about all 3 compartments of the knee. Medial and patellofemoral compartment narrowing and sharpening of  the tibial spines. SOFT TISSUES: The soft tissues are unremarkable. IMPRESSION: 1. 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 2. No acute findings. Electronically signed by: Waddell Calk MD 09/01/2024 06:26 AM EDT RP Workstation: [id number]   Assessment/Plan Maria [name] is a 68 y.o. 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   Weakness and debility-likely multifactorial, given her known disc disease, knee osteoarthritis, morbid obesity and now suspicion for possible UTI. -Inpatient admission -Treat potential UTI as below -PT/OT evaluation, would certainly benefit from home health services, and perhaps even rehab  UTI-patient has a prior history of Klebsiella UTI, she has increased frequency of urination in the setting of acute on chronic weakness that could be due to acute infection -Follow-up urine culture -Continue empiric IV Rocephin   Chronic heart failure with preserved EF-patient appears euvolemic on exam -Toprol -XL -Torsemide  daily with potassium supplementation -Aldactone   Chronic pain-would likely benefit from outpatient pain management consultation -Patient given  one-time dose of IV Dilaudid  due to uncontrolled pain -Continue home dose oxycodone  10 mg every 8 hours as needed  DM 2-on Mounjaro -Heart healthy carb modified diet -Moderate dose sliding scale  DVT prophylaxis: Lovenox      Code Status: Full Code  Consults called: None  Admission status: The appropriate patient status for this patient is INPATIENT. Inpatient status is judged to be reasonable and necessary in order to provide the required intensity of service to ensure the patient's safety. The patient's presenting symptoms, physical exam findings, and initial radiographic and laboratory data in the context of their chronic comorbidities is felt to place them at high risk for further clinical deterioration. Furthermore, it is not anticipated that the patient will be medically stable for discharge from the hospital within 2 midnights of admission.    I certify that at the point of admission it is my clinical judgment that the patient will require inpatient hospital care spanning beyond 2 midnights from the point of admission due to high intensity of service, high risk for further deterioration and high frequency of surveillance required  Time spent: 56 minutes  Maria Kerwin CHRISTELLA Gail MD [facility] Pager [phone]  If 7PM-7AM, please contact night-coverage [email or web address] Password [email or web address]  09/01/2024, 10:56 AM

## 2024-09-01 NOTE — Progress Notes (Signed)
 Chaplains received a consult to assist with HCPOA.  We were unable to meet with him today and do not typically do HCPOA over the weekend unless it is urgently needed.  We will follow up Monday if pt is still here.  If there are urgent needs, please page us  at [phone].

## 2024-09-01 NOTE — ED Provider Notes (Signed)
 [place] EMERGENCY DEPARTMENT AT [facility] Provider Note   CSN: [id number] Arrival date & time: 09/01/24  0455     Patient presents with: Back Pain   Maria [name] is a 68 y.o. female.  {Add pertinent medical, surgical, social history, OB history to HPI:32947} HPI     This is a 68 year old female who presents with generalized weakness and lower extremity pain.  Patient reports that she has knee pain at baseline.  She has had progressive difficulty ambulating secondary to pain and weakness in her legs.  She states it is mostly pain.  She does take oxycodone  at home for pain.  No injury.  She also has chronic back pain.  No difficulty urinating or numbness of the lower extremities.  She normally ambulates with a walker.  She was referred for physical therapy from her primary doctor; however has not been able to follow-up.  Prior to Admission medications   Medication Sig Start Date End Date Taking? Authorizing Provider  albuterol  (VENTOLIN  HFA) 108 (90 Base) MCG/ACT inhaler Inhale 1 puff into the lungs every 6 (six) hours as needed for wheezing or shortness of breath.    [provider]  cholecalciferol  (VITAMIN D3) 25 MCG (1000 UNIT) tablet Take 1,000 Units by mouth daily.    [provider]  clonazePAM  (KLONOPIN ) 1 MG tablet Take 1 tablet (1 mg total) by mouth 2 (two) times daily as needed for up to 5 days. for anxiety Patient taking differently: Take 1 mg by mouth 2 (two) times daily as needed for anxiety. 02/02/24 07/07/24  Raenelle Coria, MD  cyanocobalamin  1000 MCG tablet Take 1 tablet (1,000 mcg total) by mouth daily. 07/18/23   [name], MD  famotidine  (PEPCID ) 40 MG tablet Take 40 mg by mouth at bedtime. 10/04/23   [provider]  fluticasone  (FLONASE ) 50 MCG/ACT nasal spray Place 1 spray into both nostrils 2 (two) times daily as needed for rhinitis or allergies. 12/07/22   [provider]  Metamucil Fiber CHEW Chew 3 each  by mouth at bedtime as needed (to increase fiber intake).    [provider]  metoprolol  succinate (TOPROL -XL) 25 MG 24 hr tablet Take 1 tablet (25 mg total) by mouth daily. 07/18/23   [name], MD  NARCAN  4 MG/0.1ML LIQD nasal spray kit Place 0.4 mg into the nose daily as needed (opioid overdose). 04/25/19   [provider]  nystatin  (MYCOSTATIN /NYSTOP ) powder Apply 1 Application topically daily as needed (for irritation). 01/22/24   [provider]  ondansetron  (ZOFRAN -ODT) 4 MG disintegrating tablet Take 4 mg by mouth every 8 (eight) hours as needed for nausea or vomiting (dissolve the tablet orally). 09/14/23   [provider]  Oxycodone  HCl 10 MG TABS Take 10 mg by mouth every 8 (eight) hours.    [provider]  pantoprazole  (PROTONIX ) 40 MG tablet TAKE ONE TABLET BY MOUTH DAILY Patient taking differently: Take 40 mg by mouth daily before breakfast. 01/29/22   [name], MD  Potassium Chloride  ER 20 MEQ TBCR Take 40 mEq by mouth in the morning. 11/16/23   [provider]  pramipexole  (MIRAPEX ) 0.5 MG tablet Take 0.5 mg by mouth at bedtime. 12/16/23   [provider]  sodium chloride  (OCEAN NASAL SPRAY) 0.65 % nasal spray Place 1 spray into the nose as needed for congestion.    [provider]  spironolactone  (ALDACTONE ) 25 MG tablet Take 1 tablet (25 mg total) by mouth  daily. 05/03/24   Rojelio Nest, DO  tirzepatide (MOUNJARO) 15 MG/0.5ML Pen Inject 15 mg into the skin every Wednesday.    [provider]  torsemide  (DEMADEX ) 20 MG tablet Take 2 tablets (40 mg total) by mouth daily. Patient taking differently: Take 40 mg by mouth in the morning. 11/30/23   [name], MD  TYLENOL  8 HOUR ARTHRITIS PAIN 650 MG CR tablet Take 1,300 mg by mouth in the morning and at bedtime.    [provider]    Allergies: Amoxicillin -pot clavulanate, Adhesive [tape], Codeine, Crestor  [rosuvastatin  calcium ],  Cyclobenzaprine , Cymbalta  [duloxetine  hcl], Dilaudid  [hydromorphone ], Gabapentin , Lyrica [pregabalin], Morphine  and codeine, Vicodin [hydrocodone-acetaminophen ], and Latex    Review of Systems  Constitutional:  Negative for fever.  Respiratory:  Negative for shortness of breath.   Cardiovascular:  Negative for chest pain.  Musculoskeletal:        Knee pain  All other systems reviewed and are negative.   Updated Vital Signs BP (!) 143/59   Pulse 76   Temp 98.1 F (36.7 C) (Oral)   Resp 16   SpO2 96%   Physical Exam Vitals and nursing note reviewed.  Constitutional:      Appearance: She is well-developed.     Comments: Morbidly obese  HENT:     Head: Normocephalic and atraumatic.  Eyes:     Pupils: Pupils are equal, round, and reactive to light.  Cardiovascular:     Rate and Rhythm: Normal rate and regular rhythm.     Heart sounds: Normal heart sounds.  Pulmonary:     Effort: Pulmonary effort is normal. No respiratory distress.     Breath sounds: No wheezing.  Abdominal:     Palpations: Abdomen is soft.  Musculoskeletal:     Cervical back: Neck supple.     Comments: Difficult exam secondary to body habitus but no obvious deformities or effusion, normal range of motion  Skin:    General: Skin is warm and dry.  Neurological:     Mental Status: She is alert and oriented to person, place, and time.     Comments: Symmetric strength bilaterally in the lower and upper extremities  Psychiatric:        Mood and Affect: Mood normal.     (all labs ordered are listed, but only abnormal results are displayed) Labs Reviewed  CBC WITH DIFFERENTIAL/PLATELET  BASIC METABOLIC PANEL WITH GFR  CBG MONITORING, ED    EKG: None  Radiology: DG Knee Complete 4 Views Left Result Date: 09/01/2024 CLINICAL DATA:  68 year old female with chronic knee pain. EXAM: LEFT KNEE - COMPLETE 4+ VIEW COMPARISON:  Left knee radiographs 12/28/2023. FINDINGS: Four views. Chronic bulky  tricompartmental degenerative osteophytosis. Severe medial compartment joint space loss. Lateral view is slightly oblique. Cannot exclude suprapatellar joint effusion. Stable bone mineralization. No acute osseous abnormality identified. IMPRESSION: Chronic tricompartmental degeneration with severe medial joint space loss. Cannot exclude joint effusion but no acute osseous abnormality identified. Electronically Signed   By: VEAR Hurst M.D.   On: 09/01/2024 06:27   DG Knee Complete 4 Views Right Result Date: 09/01/2024 EXAM: 4 VIEW(S) XRAY OF THE KNEE 09/01/2024 05:55:54 AM COMPARISON: 12/30/2023 CLINICAL HISTORY: Pain. No injury, chronic knee pain, unable to bear weight. FINDINGS: BONES AND JOINTS: No acute fracture. Marginal spurs about all 3 compartments of the knee. Medial and patellofemoral compartment narrowing and sharpening of the tibial spines. SOFT TISSUES: The soft tissues are unremarkable. IMPRESSION: 1. 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  2. No acute findings. Electronically signed by: Waddell Calk MD 09/01/2024 06:26 AM EDT RP Workstation: [id number]    {Document cardiac monitor, telemetry assessment procedure when appropriate:32947} Procedures   Medications Ordered in the ED  oxyCODONE -acetaminophen  (PERCOCET/ROXICET) 5-325 MG per tablet 2 tablet (2 tablets Oral Given 09/01/24 0647)      {Click here for ABCD2, HEART and other calculators REFRESH Note before signing:1}                              Medical Decision Making Amount and/or Complexity of Data Reviewed Labs: ordered. Radiology: ordered.  Risk Prescription drug management.   ***  {Document critical care time when appropriate  Document review of labs and clinical decision tools ie CHADS2VASC2, etc  Document your independent review of radiology images and any outside records  Document your discussion with family members, caretakers and with consultants  Document social  determinants of health affecting pt's care  Document your decision making why or why not admission, treatments were needed:32947:::1}   Final diagnoses:  None    ED Discharge Orders     None

## 2024-09-01 NOTE — ED Triage Notes (Signed)
 Pt comes from home via [facility] for chronic back and knee pain, worse on the R side, pt also having burning sensation in her feet, pain has got worse over the past 2 days.

## 2024-09-01 NOTE — ED Provider Notes (Signed)
 Received signout; dispo pending lab workup and reevaluation.  Patient continues to have pain and generalized weakness.  UA concerning for possible urinary tract infection, patient does endorse urinary symptoms.  Perhaps her weakness is secondary to UTI.  Given antibiotics case discussed with hospitalist who agrees to admit patient.   Neysa Caron PARAS, DO 09/01/24 [phone]

## 2024-09-01 NOTE — Progress Notes (Signed)
 PT Cancellation Note  Patient Details Name: Maria [name] MRN: [id number] DOB: [birth year]   Cancelled Treatment:    Reason Eval/Treat Not Completed: Other (comment)  Pt reports recently admitted and tired (admitted ~10 hr).  Request PT in the morning. Shawni Volkov, PT Acute Rehab [facility] [phone]  Maria [name] [place] 09/01/2024, 3:19 PM

## 2024-09-02 ENCOUNTER — Inpatient Hospital Stay (HOSPITAL_COMMUNITY)

## 2024-09-02 ENCOUNTER — Encounter (HOSPITAL_COMMUNITY): Payer: Self-pay | Admitting: Internal Medicine

## 2024-09-02 DIAGNOSIS — R531 Weakness: Secondary | ICD-10-CM

## 2024-09-02 DIAGNOSIS — R262 Difficulty in walking, not elsewhere classified: Secondary | ICD-10-CM

## 2024-09-02 DIAGNOSIS — N3000 Acute cystitis without hematuria: Secondary | ICD-10-CM | POA: Diagnosis not present

## 2024-09-02 LAB — URINE CULTURE

## 2024-09-02 LAB — GLUCOSE, CAPILLARY
Glucose-Capillary: 96 mg/dL (ref 70–99)
Glucose-Capillary: 170 mg/dL — ABNORMAL HIGH (ref 70–99)
Glucose-Capillary: 97 mg/dL (ref 70–99)
Glucose-Capillary: 112 mg/dL — ABNORMAL HIGH (ref 70–99)

## 2024-09-02 LAB — CBC
HCT: 36.8 % (ref 36.0–46.0)
Hemoglobin: 11.4 g/dL — ABNORMAL LOW (ref 12.0–15.0)
MCH: 28.7 pg (ref 26.0–34.0)
MCHC: 31 g/dL (ref 30.0–36.0)
MCV: 92.7 fL (ref 80.0–100.0)
Platelets: 249 K/uL (ref 150–400)
RBC: 3.97 MIL/uL (ref 3.87–5.11)
RDW: 13.3 % (ref 11.5–15.5)
WBC: 6.1 K/uL (ref 4.0–10.5)
nRBC: 0 % (ref 0.0–0.2)

## 2024-09-02 LAB — BASIC METABOLIC PANEL WITH GFR
Anion gap: 14 (ref 5–15)
BUN: 11 mg/dL (ref 8–23)
CO2: 29 mmol/L (ref 22–32)
Calcium: 8.9 mg/dL (ref 8.9–10.3)
Chloride: 98 mmol/L (ref 98–111)
Creatinine, Ser: 1.34 mg/dL — ABNORMAL HIGH (ref 0.44–1.00)
GFR, Estimated: 43 mL/min — ABNORMAL LOW (ref >60)
Glucose, Bld: 94 mg/dL (ref 70–99)
Potassium: 3.8 mmol/L (ref 3.5–5.1)
Sodium: 141 mmol/L (ref 135–145)

## 2024-09-02 MED ORDER — FAMOTIDINE 20 MG PO TABS
40.0000 mg | ORAL_TABLET | Freq: Every day | ORAL | Status: DC
  Administered 2024-09-02 – 2024-09-12 (×11): 40 mg via ORAL
  Filled 2024-09-02 (×11): qty 2

## 2024-09-02 MED ORDER — VITAMIN B-12 1000 MCG PO TABS
1000.0000 ug | ORAL_TABLET | Freq: Every day | ORAL | Status: DC
  Administered 2024-09-02 – 2024-09-13 (×11): 1000 ug via ORAL
  Filled 2024-09-02 (×11): qty 1

## 2024-09-02 MED ORDER — POLYETHYLENE GLYCOL 3350 17 G PO PACK
17.0000 g | PACK | Freq: Every day | ORAL | Status: DC
  Administered 2024-09-04 – 2024-09-12 (×6): 17 g via ORAL
  Filled 2024-09-02 (×9): qty 1

## 2024-09-02 MED ORDER — LORAZEPAM 2 MG/ML IJ SOLN
0.5000 mg | Freq: Once | INTRAMUSCULAR | Status: AC
  Administered 2024-09-02: 0.5 mg via INTRAVENOUS
  Filled 2024-09-02: qty 1

## 2024-09-02 MED ORDER — SODIUM CHLORIDE 0.45 % IV SOLN
INTRAVENOUS | Status: DC
Start: 2024-09-02 — End: 2024-09-03

## 2024-09-02 MED ORDER — SENNOSIDES-DOCUSATE SODIUM 8.6-50 MG PO TABS
2.0000 | ORAL_TABLET | Freq: Two times a day (BID) | ORAL | Status: DC
  Administered 2024-09-02 – 2024-09-13 (×22): 2 via ORAL
  Filled 2024-09-02 (×22): qty 2

## 2024-09-02 MED ORDER — VITAMIN D 25 MCG (1000 UNIT) PO TABS
1000.0000 [IU] | ORAL_TABLET | Freq: Every day | ORAL | Status: DC
  Administered 2024-09-02 – 2024-09-13 (×11): 1000 [IU] via ORAL
  Filled 2024-09-02 (×11): qty 1

## 2024-09-02 NOTE — Evaluation (Signed)
 Physical Therapy Evaluation Patient Details Name: Maria [name] MRN: [id number] DOB: July 26, 1956 Today's Date: 09/02/2024  History of Present Illness  Pt is a 69yo female presenting with weakness, uses rollator at baseline; lives alone in handicap accessible apartment. Xray knees Chronic tricompartmental degeneration. PMH: CHF, chronic pain, depression, DM, GERD, HLD, HTN, MRSA, cervical disc surgery, chronic LE lymphedema.  Clinical Impression  Pt presents supine in bed agreeable to get up to recliner for meal. Pt required modA for bed mobility with HOB elevated and use of bed rails, modA for STS to RW, modA for SPT with RW, crying out in pain in bilateral knees during transfer. Pt lives alone in handicap apartment and has meals delivered, recently sponge bathing and utilizing WC/rollator for mobility. Pt receiving [facility] and would like to resume, pending progression. We will continue to follow acutely.        If plan is discharge home, recommend the following: Assist for transportation;A little help with bathing/dressing/bathroom;A little help with walking and/or transfers   Can travel by private vehicle        Equipment Recommendations None recommended by PT  Recommendations for Other Services       Functional Status Assessment Patient has had a recent decline in their functional status and demonstrates the ability to make significant improvements in function in a reasonable and predictable amount of time.     Precautions / Restrictions Precautions Precautions: Fall Restrictions Weight Bearing Restrictions Per Provider Order: No      Mobility  Bed Mobility Overal bed mobility: Needs Assistance Bed Mobility: Supine to Sit     Supine to sit: Mod assist, HOB elevated, Used rails     General bed mobility comments: Assist to bring BLE off bed, use of bed rails and HOB elevated    Transfers Overall transfer level: Needs assistance Equipment used: Rolling walker (2  wheels) Transfers: Sit to/from Stand, Bed to chair/wheelchair/BSC Sit to Stand: Mod assist, From elevated surface   Step pivot transfers: Mod assist       General transfer comment: Pt utilized rocking method for STS to RW from elevated bed, modA for lift assistance, pt with trunk flexed and crying out in pain, no knee bucking noted. Pt guided through SPT with RW and modA for AD management, pt with safe technique and completing full transfer priro to sitting.    Ambulation/Gait               General Gait Details: deferred  Stairs            Wheelchair Mobility     Tilt Bed    Modified Rankin (Stroke Patients Only)       Balance Overall balance assessment: Needs assistance, History of Falls Sitting-balance support: Feet supported, Bilateral upper extremity supported Sitting balance-Leahy Scale: Fair     Standing balance support: Reliant on assistive device for balance, During functional activity, Bilateral upper extremity supported Standing balance-Leahy Scale: Poor                               Pertinent Vitals/Pain Pain Assessment Pain Assessment: 0-10 Pain Score: 7  Pain Location: bilateral knees Pain Descriptors / Indicators: Discomfort, Guarding Pain Intervention(s): Limited activity within patient's tolerance, Monitored during session, Repositioned    Home Living Family/patient expects to be discharged to:: Private residence Living Arrangements: Alone   Type of Home: Apartment Home Access: Level entry  Home Layout: One level Home Equipment: Hospital bed;Tub bench;Rolling Walker (2 wheels);Rollator (4 wheels);BSC/3in1;Wheelchair - manual Additional Comments: bedside commode frame placed over toilet, received a wide Wheelchair recently that does not fit through doors.    Prior Function Prior Level of Function : History of Falls (last six months);Independent/Modified Independent             Mobility Comments: Uses a  Rollator in home, WC as well ADLs Comments: BSC over toilet. alternate sponge bathing and recently getting in shower since now has a tub bench. Orders groceries, prescriptions, insurance provides transport to [place] when needed     Extremity/Trunk Assessment   Upper Extremity Assessment Upper Extremity Assessment: Defer to OT evaluation    Lower Extremity Assessment Lower Extremity Assessment: Generalized weakness    Cervical / Trunk Assessment Cervical / Trunk Assessment: Normal  Communication   Communication Communication: No apparent difficulties    Cognition Arousal: Alert Behavior During Therapy: WFL for tasks assessed/performed   PT - Cognitive impairments: No apparent impairments                         Following commands: Intact       Cueing Cueing Techniques: Verbal cues     General Comments      Exercises     Assessment/Plan    PT Assessment Patient needs continued PT services  PT Problem List Decreased strength;Decreased activity tolerance;Decreased balance;Decreased mobility;Pain       PT Treatment Interventions DME instruction;Gait training;Functional mobility training;Therapeutic activities;Therapeutic exercise;Balance training;Patient/family education;Wheelchair mobility training    PT Goals (Current goals can be found in the Care Plan section)  Acute Rehab PT Goals Patient Stated Goal: To go home PT Goal Formulation: With patient Time For Goal Achievement: 09/16/24 Potential to Achieve Goals: Good    Frequency Min 1X/week     Co-evaluation               AM-PAC PT 6 Clicks Mobility  Outcome Measure Help needed turning from your back to your side while in a flat bed without using bedrails?: A Little Help needed moving from lying on your back to sitting on the side of a flat bed without using bedrails?: A Little Help needed moving to and from a bed to a chair (including a wheelchair)?: A Little Help needed standing up from a  chair using your arms (e.g., wheelchair or bedside chair)?: A Little Help needed to walk in hospital room?: Total Help needed climbing 3-5 steps with a railing? : Total 6 Click Score: 14    End of Session Equipment Utilized During Treatment: Gait belt Activity Tolerance: Patient limited by pain Patient left: in chair;with call bell/phone within reach;with chair alarm set Nurse Communication: Mobility status PT Visit Diagnosis: Muscle weakness (generalized) (M62.81);Difficulty in walking, not elsewhere classified (R26.2)    Time: 1000-1030 PT Time Calculation (min) (ACUTE ONLY): 30 min   Charges:   PT Evaluation $PT Eval Low Complexity: 1 Low PT Treatments $Therapeutic Activity: 8-22 mins PT General Charges $$ ACUTE PT VISIT: 1 Visit         Elsie Grieves, PT, DPT [facility] Department Office: [phone]  Elsie Grieves 09/02/2024, 10:31 AM

## 2024-09-02 NOTE — Progress Notes (Signed)
 [facility] PROGRESS NOTE   Maria [name] FMW:[id number] DOB: 30-Dec-1955 DOA: 09/01/2024  PCP: Ileen [name], NP  Brief History:  68 y.o. 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     Consultants: None yet  Procedures: None yet    Subjective/Interval History: Patient mentions that she has been feeling quite fatigued and has found it very difficult to walk over the last few weeks especially over the last 1 week.  Denies any falls or injuries.  She has felt her back pain has been worse over the last 1 to 2 weeks.  Denies any shortness of breath.  No chest pain.    Assessment/Plan:  Ambulatory dysfunction/generalized weakness Usually uses a walker at home but has been finding it difficult to do so over the past week or so.  She is able to move her lower extremities but significant weakness noted.  Unable to elicit reflexes due to body habitus. Presentation is likely multifactorial considering her morbid obesity, possible UTI, deconditioning.  Need to rule out acute spinal process. Patient with chronic lower back pain but worse over the last few weeks.  Denies any falls or injuries. Previous imaging studies reviewed.  She had a MRI of the cervical spine back in March which showed severe spinal stenosis and moderate to severe bilateral neuroforaminal stenosis at C4-C5.  Also noted in C3-C4 region. She was noted to have undergone MRI of the lumbar spine in 2024 which showed degenerative changes.  Moderate to severe spinal and lateral recess stenosis at L3-L4 and L4-L5 noted.  Disease also noted in L2-L3.  She underwent thoracic spine MRI at the same time which showed mild degenerative changes without any high-grade spinal stenosis. Will proceed with MRI of  the cervical thoracic and lumbar spines during this hospitalization considering her worsening ambulatory status over the past few weeks. PT and OT evaluation.  Concern for UTI Previous history of Klebsiella UTI.  Came in with frequent urination. Empirically on ceftriaxone .  Chronic diastolic CHF Creatinine noted to be higher today.  Has had a poor oral intake.  Will give her IV fluids and recheck labs in the morning.  Will hold her torsemide  and spironolactone . Continue beta-blocker.  Chronic pain syndrome On oxycodone  at home which is being continued. Bowel regimen.  Diabetes mellitus type 2 Monitor CBGs.  SSI.  HbA1c 5.0.  Bilateral knee pain X-rays showed significant arthritis without any acute findings.  Class III obesity Estimated body mass index is 54.74 kg/m as calculated from the following:   Height as of 07/07/24: 5' 3 (1.6 m).   Weight as of 07/07/24: 140.2 kg.   DVT Prophylaxis: Lovenox  Code Status: Full code Family Communication: Discussed with patient Disposition Plan: To be determined  Status is: Inpatient Remains inpatient appropriate because: Ambulatory dysfunction    Medications: Scheduled:  clonazePAM   1 mg Oral QHS   enoxaparin  (LOVENOX ) injection  40 mg Subcutaneous Daily   fluticasone   1 spray Each Nare Daily   insulin  aspart  0-15 Units Subcutaneous TID WC   insulin  aspart  0-5 Units Subcutaneous QHS   LORazepam   0.5 mg Intravenous Once   metoprolol  succinate  25 mg Oral Daily   nystatin   1 Application Topical Daily   pantoprazole   40 mg Oral Daily   potassium chloride  SA  40 mEq  Oral Daily   pramipexole   0.5 mg Oral TID   Continuous:  sodium chloride  75 mL/hr at 09/02/24 0842   cefTRIAXone  (ROCEPHIN )  IV 1 g (09/02/24 0844)   PRN:acetaminophen  **OR** acetaminophen , albuterol , HYDROmorphone  (DILAUDID ) injection, ondansetron  **OR** ondansetron  (ZOFRAN ) IV, oxyCODONE , tiZANidine , traZODone   Antibiotics: Anti-infectives (From admission,  onward)    Start     Dose/Rate Route Frequency Ordered Stop   09/02/24 0900  cefTRIAXone  (ROCEPHIN ) 1 g in sodium chloride  0.9 % 100 mL IVPB        1 g 200 mL/hr over 30 Minutes Intravenous Every 24 hours 09/01/24 1102     09/01/24 0900  cefTRIAXone  (ROCEPHIN ) 1 g in sodium chloride  0.9 % 100 mL IVPB        1 g 200 mL/hr over 30 Minutes Intravenous  Once 09/01/24 0852 09/01/24 0948       Objective:  Vital Signs  Vitals:   09/01/24 1826 09/01/24 2222 09/02/24 0001 09/02/24 0431  BP: (!) 143/45 (!) 112/45 (!) 131/49 (!) 141/62  Pulse: 70 78  81  Resp: 17 18  18   Temp: (!) 97.5 F (36.4 C) 98.4 F (36.9 C)  98.3 F (36.8 C)  TempSrc: Oral Oral    SpO2:        Intake/Output Summary (Last 24 hours) at 09/02/2024 1012 Last data filed at 09/02/2024 0158 Gross per 24 hour  Intake --  Output 6000 ml  Net -6000 ml    General appearance: Awake alert.  In no distress Resp: Clear to auscultation bilaterally.  Normal effort Cardio: S1-S2 is normal regular.  No S3-S4.  No rubs murmurs or bruit GI: Abdomen is soft.  Nontender nondistended.  Bowel sounds are present normal.  No masses organomegaly Extremities: Significantly restricted range of motion of lower extremities. Neurologic: Alert and oriented x3.  No focal neurological deficits.    Lab Results:  Data Reviewed: I have personally reviewed following labs and reports of the imaging studies  CBC: Recent Labs  Lab 09/01/24 0659 09/02/24 0506  WBC 8.3 6.1  NEUTROABS 4.7  --   HGB 11.0* 11.4*  HCT 35.1* 36.8  MCV 94.4 92.7  PLT 241 249    Basic Metabolic Panel: Recent Labs  Lab 09/01/24 0659 09/02/24 0506  NA 139 141  K 3.7 3.8  CL 101 98  CO2 27 29  GLUCOSE 94 94  BUN 11 11  CREATININE 1.08* 1.34*  CALCIUM  9.1 8.9    HbA1C: Recent Labs    09/01/24 1126  HGBA1C 5.0    CBG: Recent Labs  Lab 09/01/24 0506 09/01/24 1323 09/01/24 1556 09/01/24 2108 09/02/24 0810  GLUCAP 88 83 84 91 96     Radiology Studies: DG Knee Complete 4 Views Left Result Date: 09/01/2024 CLINICAL DATA:  68 year old female with chronic knee pain. EXAM: LEFT KNEE - COMPLETE 4+ VIEW COMPARISON:  Left knee radiographs 12/28/2023. FINDINGS: Four views. Chronic bulky tricompartmental degenerative osteophytosis. Severe medial compartment joint space loss. Lateral view is slightly oblique. Cannot exclude suprapatellar joint effusion. Stable bone mineralization. No acute osseous abnormality identified. IMPRESSION: Chronic tricompartmental degeneration with severe medial joint space loss. Cannot exclude joint effusion but no acute osseous abnormality identified. Electronically Signed   By: VEAR Hurst M.D.   On: 09/01/2024 06:27   DG Knee Complete 4 Views Right Result Date: 09/01/2024 EXAM: 4 VIEW(S) XRAY OF THE KNEE 09/01/2024 05:55:54 AM COMPARISON: 12/30/2023 CLINICAL HISTORY: Pain. No injury, chronic knee pain, unable to bear weight. FINDINGS: BONES AND  JOINTS: No acute fracture. Marginal spurs about all 3 compartments of the knee. Medial and patellofemoral compartment narrowing and sharpening of the tibial spines. SOFT TISSUES: The soft tissues are unremarkable. IMPRESSION: 1. 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 2. No acute findings. Electronically signed by: Waddell Calk MD 09/01/2024 06:26 AM EDT RP Workstation: [id number]       LOS: 1 day   Malori Myers  [facility] Pager on [email or web address]  09/02/2024, 10:12 AM

## 2024-09-02 NOTE — Evaluation (Signed)
 Occupational Therapy Evaluation Patient Details Name: Maria [name] MRN: [id number] DOB: 03/24/1956 Today's Date: 09/02/2024   History of Present Illness   Pt is a 68yo female presenting with weakness, uses rollator at baseline; lives alone in handicap accessible apartment. Xray knees Chronic tricompartmental degeneration. PMH: CHF, chronic pain, depression, DM, GERD, HLD, HTN, MRSA, cervical disc surgery, chronic LE lymphedema.     Clinical Impressions Pt admitted with weakness. Pt currently with functional limitations due to the deficits listed below (see OT Problem List).  Pt will benefit from acute skilled OT to increase their safety and independence with ADL and functional mobility for ADL to facilitate discharge.      If plan is discharge home, recommend the following:   A little help with walking and/or transfers;A little help with bathing/dressing/bathroom;Assistance with cooking/housework      Equipment Recommendations   BSC/3in1      Precautions/Restrictions   Precautions Precautions: Fall     Mobility Bed Mobility Overal bed mobility: Needs Assistance Bed Mobility: Supine to Sit     Supine to sit: Mod assist, HOB elevated, Used rails     General bed mobility comments: Assist to bring BLE off bed, use of bed rails and HOB elevated    Transfers Overall transfer level: Needs assistance Equipment used: Rolling walker (2 wheels) Transfers: Sit to/from Stand, Bed to chair/wheelchair/BSC Sit to Stand: Mod assist, From elevated surface     Step pivot transfers: Mod assist     General transfer comment: Pt utilized rocking method for STS to RW from elevated bed, modA for lift assistance, pt with trunk flexed and crying out in pain, no knee bucking noted. Pt guided through SPT with RW and modA for AD management, pt with safe technique and completing full transfer priro to sitting.      Balance Overall balance assessment: Needs assistance, History of  Falls Sitting-balance support: Feet supported, Bilateral upper extremity supported Sitting balance-Leahy Scale: Fair     Standing balance support: Reliant on assistive device for balance, During functional activity, Bilateral upper extremity supported Standing balance-Leahy Scale: Poor                             ADL either performed or assessed with clinical judgement   ADL Overall ADL's : Needs assistance/impaired Eating/Feeding: Set up;Sitting   Grooming: Set up;Sitting   Upper Body Bathing: Minimal assistance;Sitting   Lower Body Bathing: Moderate assistance;Sitting/lateral leans;Sit to/from stand   Upper Body Dressing : Set up;Sitting       Toilet Transfer: Moderate assistance;Rolling walker (2 wheels)   Toileting- Clothing Manipulation and Hygiene: Maximal assistance;Sit to/from stand;Sitting/lateral lean;Cueing for sequencing;Cueing for safety               Vision Patient Visual Report: No change from baseline              Pertinent Vitals/Pain Pain Assessment Pain Score: 7  Pain Descriptors / Indicators: Discomfort, Guarding Pain Intervention(s): Limited activity within patient's tolerance, Monitored during session, Repositioned     Extremity/Trunk Assessment Upper Extremity Assessment Upper Extremity Assessment: Defer to OT evaluation   Lower Extremity Assessment Lower Extremity Assessment: Generalized weakness   Cervical / Trunk Assessment Cervical / Trunk Assessment: Normal   Communication Communication Communication: No apparent difficulties   Cognition Arousal: Alert Behavior During Therapy: [facility] for tasks assessed/performed  Following commands: Intact       Cueing  General Comments   Cueing Techniques: Verbal cues              Home Living Family/patient expects to be discharged to:: Private residence Living Arrangements: Alone   Type of Home: Apartment Home Access:  Level entry     Home Layout: One level     Bathroom Shower/Tub: Chief Strategy Officer: Handicapped height Bathroom Accessibility: No   Home Equipment: Hospital bed;Tub bench;Rolling Walker (2 wheels);Rollator (4 wheels);BSC/3in1;Wheelchair - manual   Additional Comments: bedside commode frame placed over toilet, received a wide Wheelchair recently that does not fit through doors.      Prior Functioning/Environment Prior Level of Function : History of Falls (last six months);Independent/Modified Independent             Mobility Comments: Uses a Rollator in home, WC as well ADLs Comments: BSC over toilet. alternate sponge bathing and recently getting in shower since now has a tub bench. Orders groceries, prescriptions, insurance provides transport to MD when needed    OT Problem List: Decreased strength;Decreased safety awareness;Decreased activity tolerance;Obesity;Pain   OT Treatment/Interventions: Self-care/ADL training;Patient/family education;DME and/or AE instruction      OT Goals(Current goals can be found in the care plan section)   Acute Rehab OT Goals Patient Stated Goal: go home OT Goal Formulation: With patient Time For Goal Achievement: 09/02/24 ADL Goals Pt Will Perform Lower Body Dressing: with supervision;sit to/from stand;sitting/lateral leans Pt Will Transfer to Toilet: with supervision;bedside commode Pt Will Perform Toileting - Clothing Manipulation and hygiene: with supervision;sit to/from stand;sitting/lateral leans   OT Frequency:  Min 2X/week       AM-PAC OT 6 Clicks Daily Activity     Outcome Measure Help from another person eating meals?: A Little Help from another person taking care of personal grooming?: A Little Help from another person toileting, which includes using toliet, bedpan, or urinal?: A Lot Help from another person bathing (including washing, rinsing, drying)?: A Lot Help from another person to put on and  taking off regular upper body clothing?: A Little Help from another person to put on and taking off regular lower body clothing?: A Lot 6 Click Score: 15   End of Session Equipment Utilized During Treatment: Rolling walker (2 wheels);Gait belt Nurse Communication: Mobility status  Activity Tolerance: Patient limited by pain Patient left: in chair;with call bell/phone within reach  OT Visit Diagnosis: Unsteadiness on feet (R26.81);Other abnormalities of gait and mobility (R26.89);Muscle weakness (generalized) (M62.81)                Time: 1000-1017 OT Time Calculation (min): 17 min Charges:  OT General Charges $OT Visit: 1 Visit OT Evaluation $OT Eval Low Complexity: 1 Low   Raeshaun Simson, Norvel BIRCH 09/02/2024, 1:14 PM

## 2024-09-03 DIAGNOSIS — R262 Difficulty in walking, not elsewhere classified: Secondary | ICD-10-CM | POA: Diagnosis not present

## 2024-09-03 DIAGNOSIS — N3000 Acute cystitis without hematuria: Secondary | ICD-10-CM | POA: Diagnosis not present

## 2024-09-03 LAB — CBC
HCT: 36.9 % (ref 36.0–46.0)
Hemoglobin: 11.8 g/dL — ABNORMAL LOW (ref 12.0–15.0)
MCH: 29.9 pg (ref 26.0–34.0)
MCHC: 32 g/dL (ref 30.0–36.0)
MCV: 93.7 fL (ref 80.0–100.0)
Platelets: 234 K/uL (ref 150–400)
RBC: 3.94 MIL/uL (ref 3.87–5.11)
RDW: 12.9 % (ref 11.5–15.5)
WBC: 6.2 K/uL (ref 4.0–10.5)
nRBC: 0 % (ref 0.0–0.2)

## 2024-09-03 LAB — COMPREHENSIVE METABOLIC PANEL WITH GFR
ALT: 7 U/L (ref 0–44)
AST: 13 U/L — ABNORMAL LOW (ref 15–41)
Albumin: 3.5 g/dL (ref 3.5–5.0)
Alkaline Phosphatase: 69 U/L (ref 38–126)
Anion gap: 10 (ref 5–15)
BUN: 12 mg/dL (ref 8–23)
CO2: 29 mmol/L (ref 22–32)
Calcium: 9 mg/dL (ref 8.9–10.3)
Chloride: 99 mmol/L (ref 98–111)
Creatinine, Ser: 1.1 mg/dL — ABNORMAL HIGH (ref 0.44–1.00)
GFR, Estimated: 55 mL/min — ABNORMAL LOW (ref >60)
Glucose, Bld: 95 mg/dL (ref 70–99)
Potassium: 3.9 mmol/L (ref 3.5–5.1)
Sodium: 138 mmol/L (ref 135–145)
Total Bilirubin: 0.9 mg/dL (ref 0.0–1.2)
Total Protein: 6 g/dL — ABNORMAL LOW (ref 6.5–8.1)

## 2024-09-03 LAB — GLUCOSE, CAPILLARY
Glucose-Capillary: 87 mg/dL (ref 70–99)
Glucose-Capillary: 87 mg/dL (ref 70–99)
Glucose-Capillary: 101 mg/dL — ABNORMAL HIGH (ref 70–99)
Glucose-Capillary: 79 mg/dL (ref 70–99)

## 2024-09-03 MED ORDER — OXYCODONE HCL 5 MG PO TABS
10.0000 mg | ORAL_TABLET | ORAL | Status: DC | PRN
  Administered 2024-09-03 – 2024-09-04 (×3): 10 mg via ORAL
  Filled 2024-09-03 (×4): qty 2

## 2024-09-03 MED ORDER — METHOCARBAMOL 500 MG PO TABS
500.0000 mg | ORAL_TABLET | Freq: Four times a day (QID) | ORAL | Status: DC | PRN
  Administered 2024-09-03 – 2024-09-12 (×10): 500 mg via ORAL
  Filled 2024-09-03 (×10): qty 1

## 2024-09-03 NOTE — Progress Notes (Signed)
 [facility] PROGRESS NOTE   Maria [name] FMW:[id number] DOB: Aug 05, 1956 DOA: 09/01/2024  PCP: Ileen [name], NP  Brief History:  68 y.o. 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     Consultants: None yet  Procedures: None yet    Subjective/Interval History: Patient continues to have neck pain lower back pain and lower extremity pain.  Did work with physical therapy yesterday but has not ambulated yet.  Mentions that her weakness in the right leg seems to be better today.    Assessment/Plan:  Ambulatory dysfunction/generalized weakness Usually uses a walker at home but has been finding it difficult to do so over the past week or so.  She is able to move her lower extremities but significant weakness noted.  Unable to elicit reflexes due to body habitus. Presentation is likely multifactorial considering her morbid obesity, possible UTI, deconditioning.  Need to rule out acute spinal process. Patient with chronic lower back pain but worse over the last few weeks.  Denies any falls or injuries. Previous imaging studies reviewed.  She had a MRI of the cervical spine back in March which showed severe spinal stenosis and moderate to severe bilateral neuroforaminal stenosis at C4-C5.  Also noted in C3-C4 region. She was noted to have undergone MRI of the lumbar spine in 2024 which showed degenerative changes.  Moderate to severe spinal and lateral recess stenosis at L3-L4 and L4-L5 noted.  Disease also noted in L2-L3.  She underwent thoracic spine MRI at the same time which showed mild degenerative changes without any high-grade spinal stenosis. Underwent MRI of the cervical, thoracic and lumbar spine last night.  Stenosis and degenerative changes noted at  multiple levels in the cervical thoracic and lumbar region.  No acute findings noted however. Will have neurosurgery review these images and give further recommendations.  May need to consider steroids. Continue with PT and OT.  Concern for UTI Previous history of Klebsiella UTI.  Came in with frequent urination. Empirically on ceftriaxone . Urine culture without any growth.  WBC is normal.  Will give a 3-day course of ceftriaxone .  Chronic diastolic CHF Torsemide  and spironolactone  on hold due to dehydration.  She was given IV fluids yesterday with improvement in renal function today.   Continue beta-blocker.  Chronic pain syndrome On oxycodone  at home which is being continued. Bowel regimen.  Diabetes mellitus type 2 Monitor CBGs.  SSI.  HbA1c 5.0. She is on Mounjaro primarily for weight loss purposes.  She has lost about 60 pounds in the last few months.  Bilateral knee pain X-rays showed significant arthritis without any acute findings.  Outpatient follow-up with orthopedics  Class III obesity Estimated body mass index is 54.75 kg/m as calculated from the following:   Height as of this encounter: 5' 3 (1.6 m).   Weight as of this encounter: 140.2 kg.   DVT Prophylaxis: Lovenox  Code Status: Full code Family Communication: Discussed with patient Disposition Plan: To be determined  Status is: Inpatient Remains inpatient appropriate because: Ambulatory dysfunction    Medications: Scheduled:  cholecalciferol   1,000 Units Oral Daily   clonazePAM   1 mg Oral QHS   cyanocobalamin   1,000 mcg Oral Daily   enoxaparin  (LOVENOX ) injection  40 mg Subcutaneous Daily   famotidine   40 mg Oral QHS   fluticasone   1  spray Each Nare Daily   insulin  aspart  0-15 Units Subcutaneous TID WC   insulin  aspart  0-5 Units Subcutaneous QHS   metoprolol  succinate  25 mg Oral Daily   nystatin   1 Application Topical Daily   pantoprazole   40 mg Oral Daily   polyethylene glycol  17 g Oral Daily    potassium chloride  SA  40 mEq Oral Daily   pramipexole   0.5 mg Oral TID   senna-docusate  2 tablet Oral BID   Continuous:  cefTRIAXone  (ROCEPHIN )  IV 1 g (09/03/24 0925)   PRN:acetaminophen  **OR** acetaminophen , albuterol , HYDROmorphone  (DILAUDID ) injection, ondansetron  **OR** ondansetron  (ZOFRAN ) IV, oxyCODONE , tiZANidine , traZODone   Antibiotics: Anti-infectives (From admission, onward)    Start     Dose/Rate Route Frequency Ordered Stop   09/02/24 0900  cefTRIAXone  (ROCEPHIN ) 1 g in sodium chloride  0.9 % 100 mL IVPB        1 g 200 mL/hr over 30 Minutes Intravenous Every 24 hours 09/01/24 1102     09/01/24 0900  cefTRIAXone  (ROCEPHIN ) 1 g in sodium chloride  0.9 % 100 mL IVPB        1 g 200 mL/hr over 30 Minutes Intravenous  Once 09/01/24 0852 09/01/24 0948       Objective:  Vital Signs  Vitals:   09/02/24 1148 09/02/24 1900 09/03/24 0416 09/03/24 0921  BP: (!) 102/52 (!) 132/54 (!) 123/55   Pulse: 71 74 72   Resp: 16 16 20    Temp: 98 F (36.7 C) 98.4 F (36.9 C) 98.1 F (36.7 C)   TempSrc:  Oral Oral   SpO2: 95% 94%    Weight:    (!) 140.2 kg  Height:    5' 3 (1.6 m)    Intake/Output Summary (Last 24 hours) at 09/03/2024 0925 Last data filed at 09/03/2024 0755 Gross per 24 hour  Intake 240 ml  Output 5100 ml  Net -4860 ml    General appearance: Awake alert.  In no distress.  Obese Resp: Clear to auscultation bilaterally.  Normal effort Cardio: S1-S2 is normal regular.  No S3-S4.  No rubs murmurs or bruit GI: Abdomen is soft.  Nontender nondistended.  Bowel sounds are present normal.  No masses organomegaly Extremities: Physical deconditioning noted. Cranial nerves II to XII intact.  Motor strength equal bilateral upper extremities.  Weakness noted in both lower extremities.  But she is able to lift her legs off the bed.   Lab Results:  Data Reviewed: I have personally reviewed following labs and reports of the imaging studies  CBC: Recent Labs  Lab  09/01/24 0659 09/02/24 0506 09/03/24 0618  WBC 8.3 6.1 6.2  NEUTROABS 4.7  --   --   HGB 11.0* 11.4* 11.8*  HCT 35.1* 36.8 36.9  MCV 94.4 92.7 93.7  PLT 241 249 234    Basic Metabolic Panel: Recent Labs  Lab 09/01/24 0659 09/02/24 0506 09/03/24 0406  NA 139 141 138  K 3.7 3.8 3.9  CL 101 98 99  CO2 27 29 29   GLUCOSE 94 94 95  BUN 11 11 12   CREATININE 1.08* 1.34* 1.10*  CALCIUM  9.1 8.9 9.0    HbA1C: Recent Labs    09/01/24 1126  HGBA1C 5.0    CBG: Recent Labs  Lab 09/02/24 0810 09/02/24 1149 09/02/24 1658 09/02/24 2158 09/03/24 0755  GLUCAP 96 170* 97 112* 87    Radiology Studies: MR LUMBAR SPINE WO CONTRAST Result Date: 09/02/2024 CLINICAL DATA:  Initial evaluation for lower back pain,  ambulatory dysfunction. EXAM: MRI LUMBAR SPINE WITHOUT CONTRAST TECHNIQUE: Multiplanar, multisequence MR imaging of the lumbar spine was performed. No intravenous contrast was administered. COMPARISON:  Prior study from 07/30/2023. FINDINGS: Segmentation: Transitional features about the lumbosacral junction with partial sacralization of the L5 vertebral body. Alignment: 3 mm facet mediated anterolisthesis of L3 on L4. Alignment otherwise normal with preservation of the normal lumbar lordosis. Vertebrae: Vertebral body height maintained without acute or chronic fracture. Bone marrow signal intensity within normal limits. No worrisome osseous lesions. No abnormal marrow edema. Conus medullaris and cauda equina: Conus extends to the L1 level. Conus medullaris within normal limits. Nerve roots of the cauda equina are somewhat irregular proximal to the L3-4 level due to stenosis. Paraspinal and other soft tissues: Paraspinous soft tissues within normal limits. Disc levels: A degree of underlying congenital spinal stenosis noted. L1-2: Negative interspace. Mild right worse than left facet arthrosis. No spinal stenosis. Foramina remain patent. L2-3: Disc desiccation with mild diffuse disc  bulge. Mild facet and ligament flavum hypertrophy with associated trace joint effusions. Resultant mild-to-moderate spinal stenosis with moderate bilateral L2 foraminal narrowing. L3-4: Trace anterolisthesis. Disc desiccation with diffuse disc bulge. Superimposed right foraminal to extraforaminal disc protrusion with annular fissure (series 4, image 22). Advanced right worse than left facet arthrosis with small joint effusion on the right. Resultant severe multifactorial spinal stenosis, with the thecal sac measuring 4 mm in AP diameter. Moderate bilateral L3 foraminal narrowing. L4-5: Diffuse disc bulge with disc desiccation. Mild reactive endplate spurring. Superimposed broad-based right subarticular to foraminal disc protrusion with annular fissure (series 4, image 26). Severe left with mild-to-moderate right facet arthrosis. Ligamentum flavum hypertrophy. Resultant severe multifactorial spinal stenosis. Moderate right with moderate to severe left L4 foraminal stenosis. L5-S1: Negative interspace. Mild facet spurring. No canal or foraminal stenosis. IMPRESSION: 1. No acute abnormality within the lumbar spine. 2. 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 3. Disc bulge with facet arthrosis at L2-3 with resultant mild-to-moderate spinal stenosis, with moderate bilateral L2 foraminal narrowing. Electronically Signed   By: Morene Hoard M.D.   On: 09/02/2024 23:11   MR THORACIC SPINE WO CONTRAST Result Date: 09/02/2024 CLINICAL DATA:  Initial evaluation for ataxia, thoracic pathology suspected. EXAM: MRI THORACIC SPINE WITHOUT CONTRAST TECHNIQUE: Multiplanar, multisequence MR imaging of the thoracic spine was performed. No intravenous contrast was administered. COMPARISON:  None Available. FINDINGS: Alignment: Mild dextroscoliosis with exaggeration of the normal thoracic kyphosis.  No listhesis. Vertebrae: Vertebral body height maintained without acute or chronic fracture. Bone marrow signal intensity within normal limits. No worrisome osseous lesions. No abnormal marrow edema. Cord:  Normal signal and morphology. Paraspinal and other soft tissues: Paraspinous soft tissues within normal limits. Trace layering bilateral pleural effusions noted. Disc levels: T5-6: Small left paracentral to foraminal disc protrusion. No stenosis. T8-9: Disc bulge with small right greater than left paracentral disc protrusions indenting the ventral thecal sac. No significant spinal stenosis. Foramina remain patent. Otherwise, minor for age degenerative disc disease elsewhere within the thoracic spine no other significant disc bulge or focal disc herniation. Mild multilevel facet arthrosis. No significant spinal stenosis. Foramina remain patent. No neural impingement. IMPRESSION: 1. Normal MRI appearance of the thoracic spinal cord. No cord signal changes to suggest myelopathy. 2. Mild for age degenerative spondylosis without significant stenosis or neural impingement. 3. Trace layering bilateral pleural effusions. Electronically Signed   By: Morene Hoard HERO.D.  On: 09/02/2024 21:55   MR CERVICAL SPINE WO CONTRAST Result Date: 09/02/2024 CLINICAL DATA:  Initial evaluation for chronic neck pain, ambulatory dysfunction. EXAM: MRI CERVICAL SPINE WITHOUT CONTRAST TECHNIQUE: Multiplanar, multisequence MR imaging of the cervical spine was performed. No intravenous contrast was administered. COMPARISON:  Prior MRI from 02/21/2024. FINDINGS: Alignment: Straightening of the normal cervical lordosis. 2 mm degenerative retrolisthesis of C4 on C5. Vertebrae: Prior ACDF at C5-C7 with solid arthrodesis. Vertebral body height maintained without acute or chronic fracture. No worrisome osseous lesions. Prominent degenerative reactive endplate change with marrow edema present about the C4-5 interspace. Reactive marrow  edema also noted about the left C3-4 facet due to active facet arthritis (series 8, image 14). Cord: Cord flattening with patchy cord signal abnormality involving the cord at the level of C4-5, which could reflect edema and/or myelomalacia, greater on the left (series 9, image 14). These changes were present on prior MRI, although are somewhat more prominent on today's exam. Otherwise normal signal morphology. Posterior Fossa, vertebral arteries, paraspinal tissues: Visualized brain and posterior fossa within normal limits. Craniocervical junction normal. Paraspinous soft tissues within normal limits. Normal flow voids seen within the vertebral arteries bilaterally. Disc levels: C2-C3: Negative interspace. Mild right greater than left facet arthrosis. No canal or foraminal stenosis. C3-C4: Disc bulge with left greater than right uncovertebral spurring. Severe left with mild right facet arthrosis. Resultant mild-to-moderate spinal stenosis. Mild right with severe left C4 foraminal narrowing. C4-C5: Degenerative intervertebral disc space narrowing with retrolisthesis. Diffuse disc bulge with bilateral uncovertebral spurring. Slight inferior migration of disc material noted. Superimposed left greater than right facet hypertrophy. Resultant severe multifactorial spinal stenosis with severe bilateral foraminal narrowing. Secondary cord flattening with associated patchy cord signal changes as above. Thecal sac measures 4 mm in AP diameter at its most narrow point. This is similar to prior. C5-C6: Prior fusion. Right paracentral endplate spurring indents the ventral thecal sac with residual mild cord flattening, but no significant spinal stenosis. Foramina appear patent. C6-C7: Prior fusion. No residual spinal stenosis. Foramina appear patent. C7-T1: Disc desiccation with mild uncovertebral spurring, but no significant disc bulge. Mild right greater than left facet arthrosis. No spinal stenosis. Mild left with moderate  right C8 foraminal narrowing, stable. IMPRESSION: 1. Prior ACDF at C5-C7 without residual or recurrent spinal stenosis. 2. 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 Associated cord flattening with patchy cord signal abnormality at this level, which could reflect edema and/or myelomalacia. Overall, changes are similar as compared to previous MRI from 02/21/2024. 3. Disc bulge with uncovertebral and facet arthrosis at C3-4 with resultant mild to moderate spinal stenosis, with severe left C4 foraminal narrowing. 4. Changes of active facet arthritis about the left C3-4 facet, with additional prominent degenerative reactive marrow edema about the C4-5 interspace. Findings could contribute to neck pain. Electronically Signed   By: Morene Hoard M.D.   On: 09/02/2024 21:48       LOS: 2 days   Maria [name] Verdene  [facility] Pager on [email or web address]  09/03/2024, 9:25 AM

## 2024-09-03 NOTE — Plan of Care (Signed)

## 2024-09-03 NOTE — Consult Note (Signed)
 Reason for Consult: Weakness Referring Physician: Medicine  Maria [name] is an 68 y.o. female.  HPI: 68 year old female remotely status post C5-6 and C6-7 anterior cervical decompression and fusion with good results.  Patient presents now with increasing weakness and gait instability.  Workup is demonstrated evidence of a significant UTI.  Patient also notes posterior cervical pain with some interscapular pain.  She has some mild loss of dexterity in both upper extremities with some intermittent numbness and tingling.  She has feelings of poor gait control.  She denies any radiating pain into her lower extremities.  She is having some chronic numbness in both lower extremities.  She is having no new bowel or bladder issues.  MRI scanning of her cervical spine demonstrates evidence of stable and solid appearance of her prior fusion at C5-6 and C6-7.  She has evidence of marked adjacent segment degeneration with severe stenosis with ongoing cord compression and some cord signal change at C4-5.  Remainder of her cervical spine looks reasonably good.  MRI scan of her thoracic spine demonstrates some early spondylitic disease but no evidence of high-grade stenosis disc herniation or other major structural problem.  MRI scanning of her lumbar spine demonstrates evidence of lower lumbar disc degeneration with some facet arthropathy with moderately severe multifactorial stenosis at L3-4 and L4-5.  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 2010   CERVICAL DISC SURGERY  may 24th 2010   COLONOSCOPY WITH PROPOFOL  N/A 07/10/2016   Procedure: COLONOSCOPY WITH PROPOFOL ;  Surgeon: Elspeth Deward Naval, MD;  Location: [facility];  Service: Gastroenterology;  Laterality: N/A;   COLONOSCOPY WITH PROPOFOL  N/A 09/26/2019   Procedure: COLONOSCOPY WITH PROPOFOL ;  Surgeon: Naval Elspeth SQUIBB, MD;  Location: [facility];  Service: Gastroenterology;  Laterality: N/A;   ESOPHAGOGASTRODUODENOSCOPY (EGD) WITH PROPOFOL  N/A 07/10/2016   Procedure: ESOPHAGOGASTRODUODENOSCOPY (EGD) WITH PROPOFOL ;  Surgeon: Elspeth Deward Naval, MD;  Location: [facility];  Service: Gastroenterology;  Laterality: N/A;   POLYPECTOMY  09/26/2019   Procedure: POLYPECTOMY;  Surgeon: Naval Elspeth SQUIBB, MD;  Location: [facility];  Service: Gastroenterology;;   RADIOLOGY WITH ANESTHESIA N/A 07/30/2023   Procedure: MRI WITH ANESTHESIA - THORACIC LUMBAR WITHOUT CONSTRAST;  Surgeon: Radiologist, Medication, MD;  Location: [facility];  Service: Radiology;  Laterality: N/A;   s/p ovary cyst     s/p right knee arthroscopy     Dr. Josefina ortho    Family History  Problem Relation Age of Onset   Heart disease Mother    Hypertension Mother    Diabetes Mother    Heart failure Mother    Asthma Sister  Anxiety disorder Sister    Depression Sister    Hypertension Father    Asthma Daughter    Bipolar disorder Daughter    Cancer Maternal Uncle        [place]   Cancer Other        ovarian   Liver cancer Paternal Grandmother        ????    Social History:  reports that she quit smoking about 15 years ago. Her smoking use included  cigarettes. She started smoking about 45 years ago. She has never used smokeless tobacco. She reports that she does not drink alcohol  and does not use drugs.  Allergies:  Allergies  Allergen Reactions   Amoxicillin -Pot Clavulanate Nausea And Vomiting and Other (See Comments)    Projectile vomiting   Adhesive [Tape] Other (See Comments)    Tears skin off - use paper tape    Codeine Other (See Comments)    hallucinations   Crestor  [Rosuvastatin  Calcium ] Other (See Comments)    Severe muscle cramps   Cyclobenzaprine  Other (See Comments)    Hallucinations    Cymbalta  [Duloxetine  Hcl] Other (See Comments)    Hallucinations    Gabapentin  Other (See Comments)    Trembling   Lexapro [Escitalopram] Other (See Comments)    Hallucinations    Lyrica [Pregabalin] Other (See Comments)    weakness   Morphine  And Codeine Nausea And Vomiting and Other (See Comments)    Migraine headaches   Vicodin [Hydrocodone-Acetaminophen ] Other (See Comments)    hallucinations   Latex Rash    Medications: I have reviewed the patient's current medications.  Results for orders placed or performed during the hospital encounter of 09/01/24 (from the past 48 hours)  Glucose, capillary     Status: None   Collection Time: 09/01/24  3:56 PM  Result Value Ref Range   Glucose-Capillary 84 70 - 99 mg/dL    Comment: Glucose reference range applies only to samples taken after fasting for at least 8 hours.  Glucose, capillary     Status: None   Collection Time: 09/01/24  9:08 PM  Result Value Ref Range   Glucose-Capillary 91 70 - 99 mg/dL    Comment: Glucose reference range applies only to samples taken after fasting for at least 8 hours.  Basic metabolic panel     Status: Abnormal   Collection Time: 09/02/24  5:06 AM  Result Value Ref Range   Sodium 141 135 - 145 mmol/L   Potassium 3.8 3.5 - 5.1 mmol/L   Chloride 98 98 - 111 mmol/L   CO2 29 22 - 32 mmol/L   Glucose, Bld 94 70 - 99 mg/dL    Comment: Glucose  reference range applies only to samples taken after fasting for at least 8 hours.   BUN 11 8 - 23 mg/dL   Creatinine, Ser 8.65 (H) 0.44 - 1.00 mg/dL   Calcium  8.9 8.9 - 10.3 mg/dL   GFR, Estimated 43 (L) >60 mL/min    Comment: (NOTE) Calculated using the CKD-EPI Creatinine Equation (2021)    Anion gap 14 5 - 15    Comment: Performed at [facility], [address] [place], [place] [address]  CBC     Status: Abnormal   Collection Time: 09/02/24  5:06 AM  Result Value Ref Range   WBC 6.1 4.0 - 10.5 K/uL   RBC 3.97 3.87 - 5.11 MIL/uL   Hemoglobin 11.4 (L) 12.0 - 15.0 g/dL   HCT 63.1 63.9 -  46.0 %   MCV 92.7 80.0 - 100.0 fL   MCH 28.7 26.0 - 34.0 pg   MCHC 31.0 30.0 - 36.0 g/dL   RDW 86.6 88.4 - 84.4 %   Platelets 249 150 - 400 K/uL   nRBC 0.0 0.0 - 0.2 %    Comment: Performed at [facility], [address], [place], [place] [address]  Glucose, capillary     Status: None   Collection Time: 09/02/24  8:10 AM  Result Value Ref Range   Glucose-Capillary 96 70 - 99 mg/dL    Comment: Glucose reference range applies only to samples taken after fasting for at least 8 hours.  Glucose, capillary     Status: Abnormal   Collection Time: 09/02/24 11:49 AM  Result Value Ref Range   Glucose-Capillary 170 (H) 70 - 99 mg/dL    Comment: Glucose reference range applies only to samples taken after fasting for at least 8 hours.  Glucose, capillary     Status: None   Collection Time: 09/02/24  4:58 PM  Result Value Ref Range   Glucose-Capillary 97 70 - 99 mg/dL    Comment: Glucose reference range applies only to samples taken after fasting for at least 8 hours.  Glucose, capillary     Status: Abnormal   Collection Time: 09/02/24  9:58 PM  Result Value Ref Range   Glucose-Capillary 112 (H) 70 - 99 mg/dL    Comment: Glucose reference range applies only to samples taken after fasting for at least 8 hours.  Comprehensive metabolic panel with GFR     Status:  Abnormal   Collection Time: 09/03/24  4:06 AM  Result Value Ref Range   Sodium 138 135 - 145 mmol/L   Potassium 3.9 3.5 - 5.1 mmol/L   Chloride 99 98 - 111 mmol/L   CO2 29 22 - 32 mmol/L   Glucose, Bld 95 70 - 99 mg/dL    Comment: Glucose reference range applies only to samples taken after fasting for at least 8 hours.   BUN 12 8 - 23 mg/dL   Creatinine, Ser 8.89 (H) 0.44 - 1.00 mg/dL   Calcium  9.0 8.9 - 10.3 mg/dL   Total Protein 6.0 (L) 6.5 - 8.1 g/dL   Albumin  3.5 3.5 - 5.0 g/dL   AST 13 (L) 15 - 41 U/L   ALT 7 0 - 44 U/L   Alkaline Phosphatase 69 38 - 126 U/L   Total Bilirubin 0.9 0.0 - 1.2 mg/dL   GFR, Estimated 55 (L) >60 mL/min    Comment: (NOTE) Calculated using the CKD-EPI Creatinine Equation (2021)    Anion gap 10 5 - 15    Comment: Performed at [facility], [address] [place], [place] [address]  CBC     Status: Abnormal   Collection Time: 09/03/24  6:18 AM  Result Value Ref Range   WBC 6.2 4.0 - 10.5 K/uL   RBC 3.94 3.87 - 5.11 MIL/uL   Hemoglobin 11.8 (L) 12.0 - 15.0 g/dL   HCT 63.0 63.9 - 53.9 %   MCV 93.7 80.0 - 100.0 fL   MCH 29.9 26.0 - 34.0 pg   MCHC 32.0 30.0 - 36.0 g/dL   RDW 87.0 88.4 - 84.4 %   Platelets 234 150 - 400 K/uL   nRBC 0.0 0.0 - 0.2 %    Comment: Performed at [facility], [address], [place], [place] [address]  Glucose, capillary  Status: None   Collection Time: 09/03/24  7:55 AM  Result Value Ref Range   Glucose-Capillary 87 70 - 99 mg/dL    Comment: Glucose reference range applies only to samples taken after fasting for at least 8 hours.  Glucose, capillary     Status: None   Collection Time: 09/03/24 12:09 PM  Result Value Ref Range   Glucose-Capillary 87 70 - 99 mg/dL    Comment: Glucose reference range applies only to samples taken after fasting for at least 8 hours.    MR LUMBAR SPINE WO CONTRAST Result Date: 09/02/2024 CLINICAL DATA:  Initial evaluation for lower back pain,  ambulatory dysfunction. EXAM: MRI LUMBAR SPINE WITHOUT CONTRAST TECHNIQUE: Multiplanar, multisequence MR imaging of the lumbar spine was performed. No intravenous contrast was administered. COMPARISON:  Prior study from 07/30/2023. FINDINGS: Segmentation: Transitional features about the lumbosacral junction with partial sacralization of the L5 vertebral body. Alignment: 3 mm facet mediated anterolisthesis of L3 on L4. Alignment otherwise normal with preservation of the normal lumbar lordosis. Vertebrae: Vertebral body height maintained without acute or chronic fracture. Bone marrow signal intensity within normal limits. No worrisome osseous lesions. No abnormal marrow edema. Conus medullaris and cauda equina: Conus extends to the L1 level. Conus medullaris within normal limits. Nerve roots of the cauda equina are somewhat irregular proximal to the L3-4 level due to stenosis. Paraspinal and other soft tissues: Paraspinous soft tissues within normal limits. Disc levels: A degree of underlying congenital spinal stenosis noted. L1-2: Negative interspace. Mild right worse than left facet arthrosis. No spinal stenosis. Foramina remain patent. L2-3: Disc desiccation with mild diffuse disc bulge. Mild facet and ligament flavum hypertrophy with associated trace joint effusions. Resultant mild-to-moderate spinal stenosis with moderate bilateral L2 foraminal narrowing. L3-4: Trace anterolisthesis. Disc desiccation with diffuse disc bulge. Superimposed right foraminal to extraforaminal disc protrusion with annular fissure (series 4, image 22). Advanced right worse than left facet arthrosis with small joint effusion on the right. Resultant severe multifactorial spinal stenosis, with the thecal sac measuring 4 mm in AP diameter. Moderate bilateral L3 foraminal narrowing. L4-5: Diffuse disc bulge with disc desiccation. Mild reactive endplate spurring. Superimposed broad-based right subarticular to foraminal disc protrusion with  annular fissure (series 4, image 26). Severe left with mild-to-moderate right facet arthrosis. Ligamentum flavum hypertrophy. Resultant severe multifactorial spinal stenosis. Moderate right with moderate to severe left L4 foraminal stenosis. L5-S1: Negative interspace. Mild facet spurring. No canal or foraminal stenosis. IMPRESSION: 1. No acute abnormality within the lumbar spine. 2. 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 3. Disc bulge with facet arthrosis at L2-3 with resultant mild-to-moderate spinal stenosis, with moderate bilateral L2 foraminal narrowing. Electronically Signed   By: Morene Hoard M.D.   On: 09/02/2024 23:11   MR THORACIC SPINE WO CONTRAST Result Date: 09/02/2024 CLINICAL DATA:  Initial evaluation for ataxia, thoracic pathology suspected. EXAM: MRI THORACIC SPINE WITHOUT CONTRAST TECHNIQUE: Multiplanar, multisequence MR imaging of the thoracic spine was performed. No intravenous contrast was administered. COMPARISON:  None Available. FINDINGS: Alignment: Mild dextroscoliosis with exaggeration of the normal thoracic kyphosis. No listhesis. Vertebrae: Vertebral body height maintained without acute or chronic fracture. Bone marrow signal intensity within normal limits. No worrisome osseous lesions. No abnormal marrow edema. Cord:  Normal signal and morphology. Paraspinal and other soft tissues: Paraspinous soft tissues within normal limits. Trace layering bilateral pleural effusions noted. Disc levels: T5-6: Small left paracentral  to foraminal disc protrusion. No stenosis. T8-9: Disc bulge with small right greater than left paracentral disc protrusions indenting the ventral thecal sac. No significant spinal stenosis. Foramina remain patent. Otherwise, minor for age degenerative disc disease elsewhere within the thoracic spine no other significant disc bulge or  focal disc herniation. Mild multilevel facet arthrosis. No significant spinal stenosis. Foramina remain patent. No neural impingement. IMPRESSION: 1. Normal MRI appearance of the thoracic spinal cord. No cord signal changes to suggest myelopathy. 2. Mild for age degenerative spondylosis without significant stenosis or neural impingement. 3. Trace layering bilateral pleural effusions. Electronically Signed   By: Morene Hoard M.D.   On: 09/02/2024 21:55   MR CERVICAL SPINE WO CONTRAST Result Date: 09/02/2024 CLINICAL DATA:  Initial evaluation for chronic neck pain, ambulatory dysfunction. EXAM: MRI CERVICAL SPINE WITHOUT CONTRAST TECHNIQUE: Multiplanar, multisequence MR imaging of the cervical spine was performed. No intravenous contrast was administered. COMPARISON:  Prior MRI from 02/21/2024. FINDINGS: Alignment: Straightening of the normal cervical lordosis. 2 mm degenerative retrolisthesis of C4 on C5. Vertebrae: Prior ACDF at C5-C7 with solid arthrodesis. Vertebral body height maintained without acute or chronic fracture. No worrisome osseous lesions. Prominent degenerative reactive endplate change with marrow edema present about the C4-5 interspace. Reactive marrow edema also noted about the left C3-4 facet due to active facet arthritis (series 8, image 14). Cord: Cord flattening with patchy cord signal abnormality involving the cord at the level of C4-5, which could reflect edema and/or myelomalacia, greater on the left (series 9, image 14). These changes were present on prior MRI, although are somewhat more prominent on today's exam. Otherwise normal signal morphology. Posterior Fossa, vertebral arteries, paraspinal tissues: Visualized brain and posterior fossa within normal limits. Craniocervical junction normal. Paraspinous soft tissues within normal limits. Normal flow voids seen within the vertebral arteries bilaterally. Disc levels: C2-C3: Negative interspace. Mild right greater than left facet  arthrosis. No canal or foraminal stenosis. C3-C4: Disc bulge with left greater than right uncovertebral spurring. Severe left with mild right facet arthrosis. Resultant mild-to-moderate spinal stenosis. Mild right with severe left C4 foraminal narrowing. C4-C5: Degenerative intervertebral disc space narrowing with retrolisthesis. Diffuse disc bulge with bilateral uncovertebral spurring. Slight inferior migration of disc material noted. Superimposed left greater than right facet hypertrophy. Resultant severe multifactorial spinal stenosis with severe bilateral foraminal narrowing. Secondary cord flattening with associated patchy cord signal changes as above. Thecal sac measures 4 mm in AP diameter at its most narrow point. This is similar to prior. C5-C6: Prior fusion. Right paracentral endplate spurring indents the ventral thecal sac with residual mild cord flattening, but no significant spinal stenosis. Foramina appear patent. C6-C7: Prior fusion. No residual spinal stenosis. Foramina appear patent. C7-T1: Disc desiccation with mild uncovertebral spurring, but no significant disc bulge. Mild right greater than left facet arthrosis. No spinal stenosis. Mild left with moderate right C8 foraminal narrowing, stable. IMPRESSION: 1. Prior ACDF at C5-C7 without residual or recurrent spinal stenosis. 2. 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 Associated cord flattening with patchy cord signal abnormality at this level, which could reflect edema and/or myelomalacia. Overall, changes are similar as compared to previous MRI from 02/21/2024. 3. Disc bulge with uncovertebral and facet arthrosis at C3-4 with resultant mild to moderate spinal stenosis, with severe left C4 foraminal narrowing. 4. Changes of active facet arthritis about the left C3-4 facet, with additional prominent degenerative reactive marrow edema about the C4-5 interspace. Findings could contribute to neck pain.  Electronically Signed  By: Morene Hoard M.D.   On: 09/02/2024 21:48    Pertinent items noted in HPI and remainder of comprehensive ROS otherwise negative. Blood pressure (!) 112/57, pulse 68, temperature 98.1 F (36.7 C), temperature source Oral, resp. rate 20, height 5' 3 (1.6 m), weight (!) 140.2 kg, SpO2 94%. Patient is awake and alert.  She is oriented and reasonably appropriate.  Speech is fluent.  Judgment and insight are intact.  Cranial nerve function normal bilaterally.  Motor examination reveals 5/5 strength in both upper extremities.  Lower extremity strength appears intact.  Sensory examination some patchy distal sensory loss in both upper and lower extremities.  Reflexes are hypoactive but symmetric.  No evidence of long track signs.  Examination head ears eyes nose and throat is unremarked.  Chest and abdomen are obese but otherwise  benign.  Extremities are free from injury deformity.    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  I do believe the patient's myelopathy is responsible for her neck pain upper extremity loss of dexterity in her chronic gait instability.  She also has coexistent lumbar stenosis which may be playing some role as well but I think her cervical stenosis is a much more pressing problem.  Currently she is under treatment for a significant urinary tract infection.  I would like the urinary tract infection to be cleared.  I like the patient to continue with physical therapy and work towards mobilization.  If she can be discharged home this would optimally be safest for her to proceed with elective anterior cervical decompression and fusion at C4-5.  If she fails to progress with therapy and antibiotic management we could consider C4-5 anterior cervical decompression and fusion later in the week.  I have discussed situation with the patient.  I will follow remotely but please call me if new any issues or other problems  develop.  Maria [name] 09/03/2024, 1:47 PM

## 2024-09-04 DIAGNOSIS — M4802 Spinal stenosis, cervical region: Secondary | ICD-10-CM | POA: Diagnosis not present

## 2024-09-04 DIAGNOSIS — N3000 Acute cystitis without hematuria: Secondary | ICD-10-CM | POA: Diagnosis not present

## 2024-09-04 DIAGNOSIS — R262 Difficulty in walking, not elsewhere classified: Secondary | ICD-10-CM | POA: Diagnosis not present

## 2024-09-04 LAB — BASIC METABOLIC PANEL WITH GFR
Anion gap: 11 (ref 5–15)
BUN: 12 mg/dL (ref 8–23)
CO2: 27 mmol/L (ref 22–32)
Calcium: 9.6 mg/dL (ref 8.9–10.3)
Chloride: 100 mmol/L (ref 98–111)
Creatinine, Ser: 0.9 mg/dL (ref 0.44–1.00)
GFR, Estimated: >60 mL/min (ref >60)
Glucose, Bld: 105 mg/dL — ABNORMAL HIGH (ref 70–99)
Potassium: 4.2 mmol/L (ref 3.5–5.1)
Sodium: 138 mmol/L (ref 135–145)

## 2024-09-04 LAB — GLUCOSE, CAPILLARY
Glucose-Capillary: 107 mg/dL — ABNORMAL HIGH (ref 70–99)
Glucose-Capillary: 88 mg/dL (ref 70–99)
Glucose-Capillary: 103 mg/dL — ABNORMAL HIGH (ref 70–99)
Glucose-Capillary: 114 mg/dL — ABNORMAL HIGH (ref 70–99)

## 2024-09-04 MED ORDER — SODIUM CHLORIDE 0.9 % IV SOLN
1.0000 g | INTRAVENOUS | Status: AC
  Administered 2024-09-05 – 2024-09-06 (×2): 1 g via INTRAVENOUS
  Filled 2024-09-04 (×2): qty 10

## 2024-09-04 MED ORDER — OXYCODONE HCL 5 MG PO TABS
10.0000 mg | ORAL_TABLET | ORAL | Status: DC | PRN
Start: 2024-09-04 — End: 2024-09-11
  Administered 2024-09-04 – 2024-09-06 (×9): 15 mg via ORAL
  Administered 2024-09-07: 10 mg via ORAL
  Administered 2024-09-07 (×3): 15 mg via ORAL
  Administered 2024-09-08: 10 mg via ORAL
  Administered 2024-09-08: 15 mg via ORAL
  Administered 2024-09-08: 10 mg via ORAL
  Administered 2024-09-08 – 2024-09-09 (×2): 15 mg via ORAL
  Administered 2024-09-09: 10 mg via ORAL
  Administered 2024-09-09: 15 mg via ORAL
  Administered 2024-09-10: 10 mg via ORAL
  Administered 2024-09-10 (×2): 15 mg via ORAL
  Administered 2024-09-11: 10 mg via ORAL
  Filled 2024-09-04 (×3): qty 3
  Filled 2024-09-04 (×2): qty 2
  Filled 2024-09-04 (×4): qty 3
  Filled 2024-09-04 (×3): qty 2
  Filled 2024-09-04 (×2): qty 3
  Filled 2024-09-04: qty 2
  Filled 2024-09-04 (×9): qty 3

## 2024-09-04 NOTE — [place] FL2 (Signed)
 [place]  MEDICAID FL2 LEVEL OF CARE FORM     IDENTIFICATION  Patient Name: Maria [name] Birthdate: [birth year] Sex: female Admission Date (Current Location): 09/01/2024  [facility] and [place] Number:  Producer, television/film/video and Address:         Provider Number: [id number]  Attending Physician Name and Address:  Verdene Purchase, MD  Relative Name and Phone Number:       Current Level of Care: Hospital Recommended Level of Care: Skilled Nursing Facility Prior Approval Number:    Date Approved/Denied:   PASRR Number: [id number]  Discharge Plan: SNF    Current Diagnoses: Patient Active Problem List   Diagnosis Date Noted   UTI (urinary tract infection) 09/01/2024   Generalized weakness 01/31/2024   Hypoglycemia 01/31/2024   Anemia of chronic disease 01/31/2024   Pressure injury of skin 11/26/2023   Hallucinations 09/02/2023   Stasis dermatitis of both legs 08/30/2023   Hypomagnesemia 08/02/2023   Ankle pain, right 08/01/2023   Weakness of both lower extremities 07/30/2023   Morbid obesity (HCC) 07/30/2023   (HFpEF) heart failure with preserved ejection fraction (HCC) 07/15/2023   Abdominal pain 07/15/2023   Acute renal failure superimposed on stage 3a chronic kidney disease (HCC) 07/15/2023   Acute on chronic respiratory failure with hypoxia and hypercapnia (HCC) 02/08/2023   Pneumonia due to infectious organism 02/05/2023   Chronic right-sided congestive heart failure (HCC) 02/04/2023   Fall 02/02/2023   Physical deconditioning 07/22/2022   Fluid overload 07/21/2022   Acute cystitis    Dyspnea 12/19/2021   Leg swelling 11/20/2021   Bilateral knee pain 10/29/2021   Hypernatremia 09/24/2021   Cellulitis of lower extremity 09/24/2021   CKD (chronic kidney disease), stage IIIa (HCC) 09/24/2021   Sacral decubitus ulcer, stage II (HCC) 09/20/2021   Acute on chronic heart failure with preserved ejection fraction (HFpEF) (HCC) 09/19/2021   Nail disorder 07/07/2021    Pre-ulcerative corn or callous 07/07/2021   Rash 07/07/2021   Chronic diastolic CHF (congestive heart failure) (HCC) 03/17/2021   DM2 (diabetes mellitus, type 2) (HCC) 03/11/2021   Vitamin D  deficiency 01/01/2021   Medial epicondylitis 12/31/2020   Aortic atherosclerosis (HCC) 09/26/2020   Umbilical hernia 09/26/2020   Constipation 08/05/2020   Arthritis 06/12/2020   Hoarseness 05/01/2020   B12 deficiency 01/31/2020   Rosacea 01/31/2020   Neck swelling 01/31/2020   History of colonic polyps    Benign neoplasm of [place]    Nausea 08/31/2019   Throat pain 07/26/2019   HLD (hyperlipidemia) 05/24/2019   Leg cramps 05/24/2019   Lump in neck 05/24/2019   Left thyroid  nodule 01/25/2019   Jerking movements of extremities 07/20/2018   Balance disorder 07/20/2018   Right knee pain 03/21/2018   OSA (obstructive sleep apnea) 03/21/2018   Oxygen desaturation 03/21/2018   Urinary symptom or sign 03/21/2018   Lymphedema 01/25/2018   Gait disorder 01/25/2018   Dysphonia 12/07/2017   Encounter for well adult exam with abnormal findings 11/08/2017   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   [place] polyp 08/11/2016   Abdominal pain, epigastric    Dysphagia    [place] cancer screening    ACE-inhibitor cough 12/05/2015   Restless legs syndrome 10/08/2015   Venous stasis dermatitis of both lower extremities 04/26/2015   GERD without esophagitis 10/17/2014   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/17/2014   AR (allergic rhinitis) 10/17/2014   Fatty liver 10/17/2014   Chronic pain syndrome 09/19/2014   Post-traumatic osteoarthritis of both knees 09/19/2014   Spinal stenosis  09/19/2014   Depression, major, single episode, complete remission (HCC) 09/19/2014   Narcotic dependence (HCC) 09/19/2014   Acute hypoxic respiratory failure (HCC) 08/20/2014   False positive HIV serology 06/02/2013   Hypokalemia 06/29/2012   Spondylosis of 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 03/09/2012    Lumbar disc disease 03/09/2012   Morbid obesity with body mass index (BMI) of 50.0 to 59.9 in adult [facility]) 06/17/2011   Chronic low back pain 12/28/2008   GAD (generalized anxiety disorder) 12/06/2007   Depression 12/06/2007   Essential hypertension 12/06/2007   Gastroesophageal reflux disease without esophagitis 12/06/2007   LOW BACK PAIN 12/06/2007   Allergic rhinitis 08/10/2007    Orientation RESPIRATION BLADDER Height & Weight     Self, Time, Situation, Place  Normal Continent Weight: (!) 140.2 kg Height:  5' 3 (160 cm)  BEHAVIORAL SYMPTOMS/MOOD NEUROLOGICAL BOWEL NUTRITION STATUS      Continent Diet  AMBULATORY STATUS COMMUNICATION OF NEEDS Skin   Extensive Assist Verbally Normal                       Personal Care Assistance Level of Assistance  Bathing, Feeding, Dressing Bathing Assistance: Limited assistance Feeding assistance: Limited assistance Dressing Assistance: Limited assistance     Functional Limitations Info             SPECIAL CARE FACTORS FREQUENCY  PT (By licensed PT), OT (By licensed OT)     PT Frequency: 5x weekly OT Frequency: 5x weekly            Contractures Contractures Info: Not present    Additional Factors Info  Code Status, Allergies, Psychotropic Code Status Info: FULL Allergies Info: Amoxicillin -pot Clavulanate, Adhesive (Tape), Codeine, Crestor  (Rosuvastatin  Calcium ), Cyclobenzaprine , Cymbalta  (Duloxetine  Hcl), Gabapentin , Lexapro (Escitalopram), Lyrica (Pregabalin), Morphine  And Codeine, Vicodin (Hydrocodone-acetaminophen ), Latex Psychotropic Info: Clonazepam , Escitalopram         Current Medications (09/04/2024):  This is the current hospital active medication list Current Facility-Administered Medications  Medication Dose Route Frequency Provider Last Rate Last Admin   acetaminophen  (TYLENOL ) tablet 650 mg  650 mg Oral Q6H PRN Zella, Mir M, MD   650 mg at 09/04/24 0026   Or   acetaminophen  (TYLENOL ) suppository  650 mg  650 mg Rectal Q6H PRN Zella, Mir M, MD       albuterol  (PROVENTIL ) (2.5 MG/3ML) 0.083% nebulizer solution 2.5 mg  2.5 mg Nebulization Q2H PRN Zella, Mir M, MD       NOREEN ON 09/05/2024] cefTRIAXone  (ROCEPHIN ) 1 g in sodium chloride  0.9 % 100 mL IVPB  1 g Intravenous Q24H Krishnan, Gokul, MD       cholecalciferol  (VITAMIN D3) 25 MCG (1000 UNIT) tablet 1,000 Units  1,000 Units Oral Daily Krishnan, Gokul, MD   1,000 Units at 09/04/24 0908   clonazePAM  (KLONOPIN ) tablet 1 mg  1 mg Oral QHS Zella, Mir M, MD   1 mg at 09/03/24 2101   cyanocobalamin  (VITAMIN B12) tablet 1,000 mcg  1,000 mcg Oral Daily Krishnan, Gokul, MD   1,000 mcg at 09/04/24 9090   enoxaparin  (LOVENOX ) injection 40 mg  40 mg Subcutaneous Daily Zella, Mir M, MD   40 mg at 09/04/24 0908   famotidine  (PEPCID ) tablet 40 mg  40 mg Oral QHS Krishnan, Gokul, MD   40 mg at 09/03/24 2101   fluticasone  (FLONASE ) 50 MCG/ACT nasal spray 1 spray  1 spray Each Nare Daily Zella Katha HERO, MD   1 spray at 09/04/24 [phone]  insulin  aspart (novoLOG ) injection 0-15 Units  0-15 Units Subcutaneous TID WC Zella, Mir M, MD   3 Units at 09/02/24 1255   insulin  aspart (novoLOG ) injection 0-5 Units  0-5 Units Subcutaneous QHS Zella, Mir M, MD       methocarbamol  (ROBAXIN ) tablet 500 mg  500 mg Oral Q6H PRN Krishnan, Gokul, MD   500 mg at 09/04/24 9973   metoprolol  succinate (TOPROL -XL) 24 hr tablet 25 mg  25 mg Oral Daily Ikramullah, Mir M, MD   25 mg at 09/04/24 0908   nystatin  (MYCOSTATIN /NYSTOP ) topical powder 1 Application  1 Application Topical Daily Zella Katha HERO, MD   1 Application at 09/04/24 9090   ondansetron  (ZOFRAN ) tablet 4 mg  4 mg Oral Q6H PRN Zella Katha HERO, MD       Or   ondansetron  (ZOFRAN ) injection 4 mg  4 mg Intravenous Q6H PRN Zella, Mir M, MD       oxyCODONE  (Oxy IR/ROXICODONE ) immediate release tablet 10-15 mg  10-15 mg Oral Q4H PRN Krishnan, Gokul, MD   15 mg at 09/04/24 1429    pantoprazole  (PROTONIX ) EC tablet 40 mg  40 mg Oral Daily Ikramullah, Mir M, MD   40 mg at 09/04/24 0908   polyethylene glycol (MIRALAX  / GLYCOLAX ) packet 17 g  17 g Oral Daily Krishnan, Gokul, MD   17 g at 09/04/24 9090   potassium chloride  SA (KLOR-CON  M) CR tablet 40 mEq  40 mEq Oral Daily Zella, Mir M, MD   40 mEq at 09/04/24 0908   pramipexole  (MIRAPEX ) tablet 0.5 mg  0.5 mg Oral TID Zella Katha HERO, MD   0.5 mg at 09/04/24 9090   senna-docusate (Senokot-S) tablet 2 tablet  2 tablet Oral BID Krishnan, Gokul, MD   2 tablet at 09/04/24 9091   traZODone  (DESYREL ) tablet 25 mg  25 mg Oral QHS PRN Zella Katha HERO, MD   25 mg at 09/03/24 2104     Discharge Medications: Please see discharge summary for a list of discharge medications.  Relevant Imaging Results:  Relevant Lab Results:   Additional Information SSN  [id number]  Doneta Glenys DASEN, RN

## 2024-09-04 NOTE — Progress Notes (Addendum)
 Physical Therapy Treatment Patient Details Name: Maria [name] MRN: [id number] DOB: 02-02-1956 Today's Date: 09/04/2024   History of Present Illness Pt is a 68yo female presenting with weakness, uses rollator at baseline; lives alone in handicap accessible apartment. Xray knees Chronic tricompartmental degeneration. PMH: CHF, chronic pain, depression, DM, GERD, HLD, HTN, MRSA, cervical disc surgery, chronic LE lymphedema.    PT Comments  Pt has multidirectional unsteadiness in standing and requires min assist for standing balance. Pt was able to take 3 steps forwards with a rollator but then could not continue further 2* B knee pain (L moreso than R). Pt performed seated BUE/LE exercises for strengthening.  She is not mobilizing well enough to safely DC home alone. Patient will benefit from continued inpatient follow up therapy, <3 hours/day.    If plan is discharge home, recommend the following: Assist for transportation;A little help with bathing/dressing/bathroom;A little help with walking and/or transfers   Can travel by private vehicle     No  Equipment Recommendations  None recommended by PT    Recommendations for Other Services       Precautions / Restrictions Precautions Precautions: Fall Recall of Precautions/Restrictions: Intact Precaution/Restrictions Comments: reports most recent fall was in March 2025 Restrictions Weight Bearing Restrictions Per Provider Order: No     Mobility  Bed Mobility Overal bed mobility: Needs Assistance Bed Mobility: Supine to Sit     Supine to sit: Supervision, HOB elevated, Used rails     General bed mobility comments: increased time/effort, VCs to fully advance R hip to edge of bed    Transfers Overall transfer level: Needs assistance Equipment used: Rolling walker (2 wheels) Transfers: Sit to/from Stand Sit to Stand: Min assist, From elevated surface   Step pivot transfers: Min assist       General transfer comment: pt  used momentum for sit to stand from elevated bed, min A to steady 2* mild posterior lean initially    Ambulation/Gait Ambulation/Gait assistance: Min assist Gait Distance (Feet): 2 Feet Assistive device: Rollator (4 wheels) Gait Pattern/deviations: Decreased stride length, Shuffle, Step-to pattern Gait velocity: decr     General Gait Details: pt took a few steps forwards with rollator, min A for  multidirectional unsteadiness, distance limited by B knee pain (L more than R)   Stairs             Wheelchair Mobility     Tilt Bed    Modified Rankin (Stroke Patients Only)       Balance Overall balance assessment: Needs assistance, History of Falls Sitting-balance support: Feet supported Sitting balance-Leahy Scale: Fair     Standing balance support: Reliant on assistive device for balance, During functional activity, Bilateral upper extremity supported Standing balance-Leahy Scale: Poor                              Communication Communication Communication: No apparent difficulties  Cognition Arousal: Alert Behavior During Therapy: WFL for tasks assessed/performed                             Following commands: Intact      Cueing Cueing Techniques: Verbal cues  Exercises General Exercises - Upper Extremity Shoulder Flexion: AROM, Both, 10 reps, Seated General Exercises - Lower Extremity Ankle Circles/Pumps: AROM, Both, 10 reps, Seated Quad Sets: AROM, Both, 5 reps, Supine Long Arc Quad: AROM, Both, 10 reps, Seated Hip  Flexion/Marching: AROM, Both, 10 reps, Seated    General Comments General comments (skin integrity, edema, etc.): Dicussed in detail home vs SNF , how SW can assist in locating facilities that pt may desire      Pertinent Vitals/Pain Pain Assessment Faces Pain Scale: Hurts even more Pain Location: B knees (L more than R) Pain Descriptors / Indicators: Guarding, Grimacing, Moaning Pain Intervention(s): Limited  activity within patient's tolerance, Monitored during session, Premedicated before session    Home Living                          Prior Function            PT Goals (current goals can now be found in the care plan section) Acute Rehab PT Goals Patient Stated Goal: To go home PT Goal Formulation: With patient Time For Goal Achievement: 09/16/24 Potential to Achieve Goals: Good Progress towards PT goals: Progressing toward goals    Frequency    Min 3X/week      PT Plan      Co-evaluation              AM-PAC PT 6 Clicks Mobility   Outcome Measure  Help needed turning from your back to your side while in a flat bed without using bedrails?: A Little Help needed moving from lying on your back to sitting on the side of a flat bed without using bedrails?: A Little Help needed moving to and from a bed to a chair (including a wheelchair)?: A Little Help needed standing up from a chair using your arms (e.g., wheelchair or bedside chair)?: A Lot Help needed to walk in hospital room?: A Lot Help needed climbing 3-5 steps with a railing? : Total 6 Click Score: 14    End of Session Equipment Utilized During Treatment: Gait belt Activity Tolerance: Patient limited by pain Patient left: in chair;with call bell/phone within reach Nurse Communication: Mobility status PT Visit Diagnosis: Muscle weakness (generalized) (M62.81);Difficulty in walking, not elsewhere classified (R26.2);Pain     Time: 8855-8792 PT Time Calculation (min) (ACUTE ONLY): 23 min  Charges:    $Gait Training: 8-22 mins $Therapeutic Exercise: 8-22 mins PT General Charges $$ ACUTE PT VISIT: 1 Visit                     Sylvan Nest Kistler PT 09/04/2024  Acute Rehabilitation Services  Office [phone]

## 2024-09-04 NOTE — Progress Notes (Signed)
 If patient is able to progress with therapies and her pain is under control my preference would be for her to be discharged home and plan for surgery as an outpatient.

## 2024-09-04 NOTE — Progress Notes (Signed)
 Chaplains received a consult to provide Maria [name] with assistance with HCPOA.  I met with Maria [name] to answer initial questions and bring her the paperwork.  She would like to discuss with her daughter at this time and will reach out if she has other questions or would like to try to complete it.

## 2024-09-04 NOTE — Progress Notes (Addendum)
 [facility] PROGRESS NOTE   Maria [name] FMW:[id number] DOB: [birth year] DOA: 09/01/2024  PCP: Ileen [name], NP  Brief History:  68 y.o. 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     Consultants: Neurosurgery  Procedures: None yet    Subjective/Interval History: Patient complains of neck pain shoulder pain.  Able to move her legs better though.    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  She is able to move her lower extremities but significant weakness noted.  Unable to elicit reflexes due to body habitus. Presentation is likely multifactorial considering her morbid obesity, possible UTI, deconditioning.  Need to rule out acute spinal process. Patient with chronic lower back pain but worse over the last few weeks.  Denies any falls or injuries. Previous imaging studies reviewed.  She had a MRI of the cervical spine back in March which showed severe spinal stenosis and moderate to severe bilateral neuroforaminal stenosis at C4-C5.  Also noted in C3-C4 region. She was noted to have undergone MRI of the lumbar spine in 2024 which showed degenerative changes.  Moderate to severe spinal and lateral recess stenosis at L3-L4 and L4-L5 noted.  Disease also noted in L2-L3.  She underwent thoracic spine MRI at the same time which showed mild degenerative changes without any high-grade spinal stenosis. Underwent MRI of the cervical, thoracic and lumbar spine during this hospitalization.  Stenosis and degenerative changes noted at multiple levels in the cervical thoracic and lumbar region.  No acute findings noted  however. Neurosurgery was consulted.  They do feel that her cervical disease is significant enough to be causing symptoms.  They plan surgical intervention but want to do it on an elective basis. Continue PT and OT.  Hopefully she will be able to mobilize enough to be able to go home and then schedule the surgery in the outpatient setting.  If however she does not progress then neurosurgery may consider operating later this week.  Concern for UTI Previous history of Klebsiella UTI in 2024.  Came in with frequent urination. Empirically on ceftriaxone . Urine culture without any growth.  WBC is normal.   Plan was to give her a 3-day course of ceftriaxone .  But now that surgery is planned we may extend this to 5 days.  Will repeat a UA.    Chronic diastolic CHF Torsemide  and spironolactone  on hold due to dehydration.  She was given IV fluids with improvement.  Hold off on IV fluids going forward.  Will resume diuretics at discharge or in a few days if she is still in the hospital.  Respiratory status is stable.  Chronic pain syndrome On oxycodone  at home which is being continued.  Dose will be adjusted today. Bowel regimen.  Diabetes mellitus type 2 Monitor CBGs.  SSI.  HbA1c 5.0. She is on Mounjaro primarily for weight loss purposes.  She has lost about 60 pounds in the last few months.  Bilateral knee pain X-rays showed significant arthritis without any acute findings.  Outpatient follow-up with orthopedics  Class III obesity Estimated body mass index is 54.75 kg/m as calculated from the following:   Height as of this encounter: 5' 3 (1.6  m).   Weight as of this encounter: 140.2 kg.   DVT Prophylaxis: Lovenox  Code Status: Full code Family Communication: Discussed with patient Disposition Plan: To be determined   Medications: Scheduled:  cholecalciferol   1,000 Units Oral Daily   clonazePAM   1 mg Oral QHS   cyanocobalamin   1,000 mcg Oral Daily   enoxaparin  (LOVENOX ) injection   40 mg Subcutaneous Daily   famotidine   40 mg Oral QHS   fluticasone   1 spray Each Nare Daily   insulin  aspart  0-15 Units Subcutaneous TID WC   insulin  aspart  0-5 Units Subcutaneous QHS   metoprolol  succinate  25 mg Oral Daily   nystatin   1 Application Topical Daily   pantoprazole   40 mg Oral Daily   polyethylene glycol  17 g Oral Daily   potassium chloride  SA  40 mEq Oral Daily   pramipexole   0.5 mg Oral TID   senna-docusate  2 tablet Oral BID   Continuous:   PRN:acetaminophen  **OR** acetaminophen , albuterol , methocarbamol , ondansetron  **OR** ondansetron  (ZOFRAN ) IV, oxyCODONE , traZODone   Antibiotics: Anti-infectives (From admission, onward)    Start     Dose/Rate Route Frequency Ordered Stop   09/02/24 0900  cefTRIAXone  (ROCEPHIN ) 1 g in sodium chloride  0.9 % 100 mL IVPB        1 g 200 mL/hr over 30 Minutes Intravenous Every 24 hours 09/01/24 1102 09/04/24 0954   09/01/24 0900  cefTRIAXone  (ROCEPHIN ) 1 g in sodium chloride  0.9 % 100 mL IVPB        1 g 200 mL/hr over 30 Minutes Intravenous  Once 09/01/24 0852 09/01/24 0948       Objective:  Vital Signs  Vitals:   09/03/24 1630 09/03/24 1938 09/04/24 0337 09/04/24 0908  BP: (!) 156/58 (!) 159/57 (!) 139/58 (!) 146/52  Pulse: 73 70 68 68  Resp: 16 18 18    Temp: 98.1 F (36.7 C) 98.1 F (36.7 C) 98.1 F (36.7 C)   TempSrc:      SpO2:      Weight:      Height:        Intake/Output Summary (Last 24 hours) at 09/04/2024 1008 Last data filed at 09/03/2024 2233 Gross per 24 hour  Intake 200 ml  Output 500 ml  Net -300 ml    general appearance: Awake alert.  In no distress Resp: Clear to auscultation bilaterally.  Normal effort Cardio: S1-S2 is normal regular.  No S3-S4.  No rubs murmurs or bruit GI: Abdomen is soft.  Nontender nondistended.  Bowel sounds are present normal.  No masses organomegaly Extremities: Able to move her legs better today than the last few days.  Lab Results:  Data Reviewed: I have  personally reviewed following labs and reports of the imaging studies  CBC: Recent Labs  Lab 09/01/24 0659 09/02/24 0506 09/03/24 0618  WBC 8.3 6.1 6.2  NEUTROABS 4.7  --   --   HGB 11.0* 11.4* 11.8*  HCT 35.1* 36.8 36.9  MCV 94.4 92.7 93.7  PLT 241 249 234    Basic Metabolic Panel: Recent Labs  Lab 09/01/24 0659 09/02/24 0506 09/03/24 0406 09/04/24 0349  NA 139 141 138 138  K 3.7 3.8 3.9 4.2  CL 101 98 99 100  CO2 27 29 29 27   GLUCOSE 94 94 95 105*  BUN 11 11 12 12   CREATININE 1.08* 1.34* 1.10* 0.90  CALCIUM  9.1 8.9 9.0 9.6    HbA1C: Recent Labs    09/01/24 1126  HGBA1C 5.0  CBG: Recent Labs  Lab 09/03/24 0755 09/03/24 1209 09/03/24 1713 09/03/24 1941 09/04/24 0740  GLUCAP 87 87 101* 79 107*    Radiology Studies: MR LUMBAR SPINE WO CONTRAST Result Date: 09/02/2024 CLINICAL DATA:  Initial evaluation for lower back pain, ambulatory dysfunction. EXAM: MRI LUMBAR SPINE WITHOUT CONTRAST TECHNIQUE: Multiplanar, multisequence MR imaging of the lumbar spine was performed. No intravenous contrast was administered. COMPARISON:  Prior study from 07/30/2023. FINDINGS: Segmentation: Transitional features about the lumbosacral junction with partial sacralization of the L5 vertebral body. Alignment: 3 mm facet mediated anterolisthesis of L3 on L4. Alignment otherwise normal with preservation of the normal lumbar lordosis. Vertebrae: Vertebral body height maintained without acute or chronic fracture. Bone marrow signal intensity within normal limits. No worrisome osseous lesions. No abnormal marrow edema. Conus medullaris and cauda equina: Conus extends to the L1 level. Conus medullaris within normal limits. Nerve roots of the cauda equina are somewhat irregular proximal to the L3-4 level due to stenosis. Paraspinal and other soft tissues: Paraspinous soft tissues within normal limits. Disc levels: A degree of underlying congenital spinal stenosis noted. L1-2: Negative  interspace. Mild right worse than left facet arthrosis. No spinal stenosis. Foramina remain patent. L2-3: Disc desiccation with mild diffuse disc bulge. Mild facet and ligament flavum hypertrophy with associated trace joint effusions. Resultant mild-to-moderate spinal stenosis with moderate bilateral L2 foraminal narrowing. L3-4: Trace anterolisthesis. Disc desiccation with diffuse disc bulge. Superimposed right foraminal to extraforaminal disc protrusion with annular fissure (series 4, image 22). Advanced right worse than left facet arthrosis with small joint effusion on the right. Resultant severe multifactorial spinal stenosis, with the thecal sac measuring 4 mm in AP diameter. Moderate bilateral L3 foraminal narrowing. L4-5: Diffuse disc bulge with disc desiccation. Mild reactive endplate spurring. Superimposed broad-based right subarticular to foraminal disc protrusion with annular fissure (series 4, image 26). Severe left with mild-to-moderate right facet arthrosis. Ligamentum flavum hypertrophy. Resultant severe multifactorial spinal stenosis. Moderate right with moderate to severe left L4 foraminal stenosis. L5-S1: Negative interspace. Mild facet spurring. No canal or foraminal stenosis. IMPRESSION: 1. No acute abnormality within the lumbar spine. 2. 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 3. Disc bulge with facet arthrosis at L2-3 with resultant mild-to-moderate spinal stenosis, with moderate bilateral L2 foraminal narrowing. Electronically Signed   By: Morene Hoard M.D.   On: 09/02/2024 23:11   MR THORACIC SPINE WO CONTRAST Result Date: 09/02/2024 CLINICAL DATA:  Initial evaluation for ataxia, thoracic pathology suspected. EXAM: MRI THORACIC SPINE WITHOUT CONTRAST TECHNIQUE: Multiplanar, multisequence MR imaging of the thoracic spine was performed. No intravenous  contrast was administered. COMPARISON:  None Available. FINDINGS: Alignment: Mild dextroscoliosis with exaggeration of the normal thoracic kyphosis. No listhesis. Vertebrae: Vertebral body height maintained without acute or chronic fracture. Bone marrow signal intensity within normal limits. No worrisome osseous lesions. No abnormal marrow edema. Cord:  Normal signal and morphology. Paraspinal and other soft tissues: Paraspinous soft tissues within normal limits. Trace layering bilateral pleural effusions noted. Disc levels: T5-6: Small left paracentral to foraminal disc protrusion. No stenosis. T8-9: Disc bulge with small right greater than left paracentral disc protrusions indenting the ventral thecal sac. No significant spinal stenosis. Foramina remain patent. Otherwise, minor for age degenerative disc disease elsewhere within the thoracic spine no other significant disc bulge or focal disc herniation. Mild multilevel facet arthrosis. No significant spinal stenosis. Foramina remain patent. No neural impingement. IMPRESSION:  1. Normal MRI appearance of the thoracic spinal cord. No cord signal changes to suggest myelopathy. 2. Mild for age degenerative spondylosis without significant stenosis or neural impingement. 3. Trace layering bilateral pleural effusions. Electronically Signed   By: Morene Hoard M.D.   On: 09/02/2024 21:55   MR CERVICAL SPINE WO CONTRAST Result Date: 09/02/2024 CLINICAL DATA:  Initial evaluation for chronic neck pain, ambulatory dysfunction. EXAM: MRI CERVICAL SPINE WITHOUT CONTRAST TECHNIQUE: Multiplanar, multisequence MR imaging of the cervical spine was performed. No intravenous contrast was administered. COMPARISON:  Prior MRI from 02/21/2024. FINDINGS: Alignment: Straightening of the normal cervical lordosis. 2 mm degenerative retrolisthesis of C4 on C5. Vertebrae: Prior ACDF at C5-C7 with solid arthrodesis. Vertebral body height maintained without acute or chronic fracture.  No worrisome osseous lesions. Prominent degenerative reactive endplate change with marrow edema present about the C4-5 interspace. Reactive marrow edema also noted about the left C3-4 facet due to active facet arthritis (series 8, image 14). Cord: Cord flattening with patchy cord signal abnormality involving the cord at the level of C4-5, which could reflect edema and/or myelomalacia, greater on the left (series 9, image 14). These changes were present on prior MRI, although are somewhat more prominent on today's exam. Otherwise normal signal morphology. Posterior Fossa, vertebral arteries, paraspinal tissues: Visualized brain and posterior fossa within normal limits. Craniocervical junction normal. Paraspinous soft tissues within normal limits. Normal flow voids seen within the vertebral arteries bilaterally. Disc levels: C2-C3: Negative interspace. Mild right greater than left facet arthrosis. No canal or foraminal stenosis. C3-C4: Disc bulge with left greater than right uncovertebral spurring. Severe left with mild right facet arthrosis. Resultant mild-to-moderate spinal stenosis. Mild right with severe left C4 foraminal narrowing. C4-C5: Degenerative intervertebral disc space narrowing with retrolisthesis. Diffuse disc bulge with bilateral uncovertebral spurring. Slight inferior migration of disc material noted. Superimposed left greater than right facet hypertrophy. Resultant severe multifactorial spinal stenosis with severe bilateral foraminal narrowing. Secondary cord flattening with associated patchy cord signal changes as above. Thecal sac measures 4 mm in AP diameter at its most narrow point. This is similar to prior. C5-C6: Prior fusion. Right paracentral endplate spurring indents the ventral thecal sac with residual mild cord flattening, but no significant spinal stenosis. Foramina appear patent. C6-C7: Prior fusion. No residual spinal stenosis. Foramina appear patent. C7-T1: Disc desiccation with mild  uncovertebral spurring, but no significant disc bulge. Mild right greater than left facet arthrosis. No spinal stenosis. Mild left with moderate right C8 foraminal narrowing, stable. IMPRESSION: 1. Prior ACDF at C5-C7 without residual or recurrent spinal stenosis. 2. 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 Associated cord flattening with patchy cord signal abnormality at this level, which could reflect edema and/or myelomalacia. Overall, changes are similar as compared to previous MRI from 02/21/2024. 3. Disc bulge with uncovertebral and facet arthrosis at C3-4 with resultant mild to moderate spinal stenosis, with severe left C4 foraminal narrowing. 4. Changes of active facet arthritis about the left C3-4 facet, with additional prominent degenerative reactive marrow edema about the C4-5 interspace. Findings could contribute to neck pain. Electronically Signed   By: Morene Hoard M.D.   On: 09/02/2024 21:48       LOS: 3 days   Maria [name]  [facility] Pager on [email or web address]  09/04/2024, 10:08 AM

## 2024-09-04 NOTE — Progress Notes (Signed)
 Occupational Therapy Treatment Patient Details Name: Maria [name] MRN: [id number] DOB: 07-Mar-1956 Today's Date: 09/04/2024   History of present illness Pt is a 68yo female presenting with weakness, uses rollator at baseline; lives alone in handicap accessible apartment. Xray knees Chronic tricompartmental degeneration. PMH: CHF, chronic pain, depression, DM, GERD, HLD, HTN, MRSA, cervical disc surgery, chronic LE lymphedema.   OT comments  Pt making gradual progress towards OT goals though remains limited by weakness and pain (back/B knees) despite pain premedication. Pt required Max A initially to stand from bedside but able to progress BSC transfers and standing from [facility] to Min A using RW. Extended time spent discussing rehab options, simulating home environment and provision of theraputty HEP. Based on pt currently requiring physical assistance for ADLs/transfers, recommend continued inpatient follow up therapy, <3 hours/day at DC. However, if pt declines postacute rehab stay, would recommend max HH services.      If plan is discharge home, recommend the following:  A lot of help with walking and/or transfers;A lot of help with bathing/dressing/bathroom   Equipment Recommendations  BSC/3in1    Recommendations for Other Services      Precautions / Restrictions Precautions Precautions: Fall Restrictions Weight Bearing Restrictions Per Provider Order: No       Mobility Bed Mobility Overal bed mobility: Needs Assistance Bed Mobility: Supine to Sit, Sit to Supine     Supine to sit: Supervision, HOB elevated, Used rails Sit to supine: Supervision   General bed mobility comments: increased time/effort to simulate home environment and personal hospital bed. pt able to bring LE to EOB though reporting some increased back pain. cued to partially log roll to reach bedrail and keep spine straight with improvements noted    Transfers Overall transfer level: Needs  assistance Equipment used: Rolling walker (2 wheels) Transfers: Sit to/from Stand, Bed to chair/wheelchair/BSC Sit to Stand: Max assist, Min assist     Step pivot transfers: Min assist     General transfer comment: bed midly elevated with Max A needed to fully stand at bedside. Once up, pt able to pivot to [facility] with Min A using RW. Gradual improvements in STS from [facility] pushing from armrests with no more than Min A needed. Crepitation noted in B knees with transfer     Balance Overall balance assessment: Needs assistance, History of Falls Sitting-balance support: Feet supported Sitting balance-Leahy Scale: Fair     Standing balance support: Reliant on assistive device for balance, During functional activity, Bilateral upper extremity supported Standing balance-Leahy Scale: Poor                             ADL either performed or assessed with clinical judgement   ADL Overall ADL's : Needs assistance/impaired Eating/Feeding: Set up;Sitting                       Toilet Transfer: Stand-pivot;BSC/3in1;Rolling walker (2 wheels) Toilet Transfer Details (indicate cue type and reason): Initially Max A to stand from bedside, Min A to pivot to/from [facility] using RW. cues for foot placement Toileting- Clothing Manipulation and Hygiene: Minimal assistance;Sit to/from stand;Sitting/lateral lean Toileting - Clothing Manipulation Details (indicate cue type and reason): assist for balance in standing, clothing mgmt with pt able to wipe and dry in standing with two seated rest breaks            Extremity/Trunk Assessment Upper Extremity Assessment Upper Extremity Assessment: Right hand dominant;RUE deficits/detail;LUE  deficits/detail RUE Deficits / Details: reports some sensation changes, nerve pain and initial numbness. provided theraputty HEP for pt to trial   Lower Extremity Assessment Lower Extremity Assessment: Defer to PT evaluation        Vision   Vision  Assessment?: No apparent visual deficits   Perception     Praxis     Communication Communication Communication: No apparent difficulties   Cognition Arousal: Alert Behavior During Therapy: WFL for tasks assessed/performed Cognition: No apparent impairments                               Following commands: Intact        Cueing   Cueing Techniques: Verbal cues  Exercises      Shoulder Instructions       General Comments Dicussed in detail home vs SNF , how SW can assist in locating facilities that pt may desire    Pertinent Vitals/ Pain       Pain Assessment Pain Assessment: Faces Faces Pain Scale: Hurts even more Pain Location: B knees, low back Pain Descriptors / Indicators: Guarding, Grimacing, Moaning Pain Intervention(s): Monitored during session  Home Living                                          Prior Functioning/Environment              Frequency  Min 2X/week        Progress Toward Goals  OT Goals(current goals can now be found in the care plan section)  Progress towards OT goals: Progressing toward goals  Acute Rehab OT Goals Patient Stated Goal: have the surgery OT Goal Formulation: With patient Time For Goal Achievement: 09/16/24 ADL Goals Pt Will Perform Lower Body Dressing: with supervision;sit to/from stand;sitting/lateral leans Pt Will Transfer to Toilet: with supervision;bedside commode Pt Will Perform Toileting - Clothing Manipulation and hygiene: with supervision;sit to/from stand;sitting/lateral leans  Plan      Co-evaluation                 AM-PAC OT 6 Clicks Daily Activity     Outcome Measure   Help from another person eating meals?: A Little Help from another person taking care of personal grooming?: A Little Help from another person toileting, which includes using toliet, bedpan, or urinal?: A Lot Help from another person bathing (including washing, rinsing, drying)?: A  Lot Help from another person to put on and taking off regular upper body clothing?: A Little Help from another person to put on and taking off regular lower body clothing?: A Lot 6 Click Score: 15    End of Session Equipment Utilized During Treatment: Gait belt;Rolling walker (2 wheels)  OT Visit Diagnosis: Unsteadiness on feet (R26.81);Other abnormalities of gait and mobility (R26.89);Muscle weakness (generalized) (M62.81)   Activity Tolerance Patient tolerated treatment well;Patient limited by pain   Patient Left in bed;with call bell/phone within reach;with bed alarm set   Nurse Communication Mobility status        Time: 9074-8990 OT Time Calculation (min): 44 min  Charges: OT General Charges $OT Visit: 1 Visit OT Treatments $Self Care/Home Management : 23-37 mins $Therapeutic Activity: 8-22 mins  Mliss NOVAK, OTR/L Acute Rehab Services Office: [phone]   Mliss Fish 09/04/2024, 11:05 AM

## 2024-09-04 NOTE — TOC Initial Note (Addendum)
 Transition of Care [facility]) - Initial/Assessment Note    Patient Details  Name: Maria [name] MRN: [id number] Date of Birth: March 18, 1956  Transition of Care [facility]) CM/SW Contact:    Doneta Glenys DASEN, RN Phone Number: 09/04/2024, 4:49 PM  Clinical Narrative:                 CM spoke with patient in the room. Patient states PTA lives in an apartment alone;active with [facility] for PT/OT 2x weekly;Agreeable to SNF work-up. CM sent referrals via HUB.FL2 started.  Expected Discharge Plan: Skilled Nursing Facility Barriers to Discharge: Continued Medical Work up, SNF Pending bed offer   Patient Goals and CMS Choice Patient states their goals for this hospitalization and ongoing recovery are:: SNF CMS Medicare.gov Compare Post Acute Care list provided to::  (NA) Choice offered to / list presented to : NA [place] ownership interest in [facility]provided to:: Parent NA    Expected Discharge Plan and Services In-house Referral: NA Discharge Planning Services: CM Consult   Living arrangements for the past 2 months: Apartment                 DME Arranged: N/A DME Agency: NA       HH Arranged: NA HH Agency: NA        Prior Living Arrangements/Services Living arrangements for the past 2 months: Apartment Lives with:: Self Patient language and need for interpreter reviewed:: Yes Do you feel safe going back to the place where you live?: Yes      Need for Family Participation in Patient Care: Yes (Comment) Care giver support system in place?: Yes (comment) Current home services: DME, Home PT (cane, rollator, BSC;[facility]-PT) Criminal Activity/Legal Involvement Pertinent to Current Situation/Hospitalization: No - Comment as needed  Activities of Daily Living   ADL Screening (condition at time of admission) Independently performs ADLs?: Yes (appropriate for developmental age) Is the patient deaf or have difficulty hearing?: No Does the patient have difficulty seeing,  even when wearing glasses/contacts?: No Does the patient have difficulty concentrating, remembering, or making decisions?: No  Permission Sought/Granted Permission sought to share information with : Case Manager Permission granted to share information with : Yes, Verbal Permission Granted  Share Information with NAME: Rodriguez,[name] (Daughter)  [phone] (  Permission granted to share info w AGENCY: [facility]        Emotional Assessment Appearance:: Appears stated age Attitude/Demeanor/Rapport: Engaged, Apprehensive Affect (typically observed): Appropriate Orientation: : Oriented to Self, Oriented to Place, Oriented to  Time, Oriented to Situation Alcohol  / Substance Use: Not Applicable Psych Involvement: No (comment)  Admission diagnosis:  UTI (urinary tract infection) [N39.0] Acute cystitis without hematuria [N30.00] Patient Active Problem List   Diagnosis Date Noted   UTI (urinary tract infection) 09/01/2024   Generalized weakness 01/31/2024   Hypoglycemia 01/31/2024   Anemia of chronic disease 01/31/2024   Pressure injury of skin 11/26/2023   Hallucinations 09/02/2023   Stasis dermatitis of both legs 08/30/2023   Hypomagnesemia 08/02/2023   Ankle pain, right 08/01/2023   Weakness of both lower extremities 07/30/2023   Morbid obesity (HCC) 07/30/2023   (HFpEF) heart failure with preserved ejection fraction (HCC) 07/15/2023   Abdominal pain 07/15/2023   Acute renal failure superimposed on stage 3a chronic kidney disease (HCC) 07/15/2023   Acute on chronic respiratory failure with hypoxia and hypercapnia (HCC) 02/08/2023   Pneumonia due to infectious organism 02/05/2023   Chronic right-sided congestive heart failure (HCC) 02/04/2023   Fall 02/02/2023  Physical deconditioning 07/22/2022   Fluid overload 07/21/2022   Acute cystitis    Dyspnea 12/19/2021   Leg swelling 11/20/2021   Bilateral knee pain 10/29/2021   Hypernatremia 09/24/2021   Cellulitis of lower  extremity 09/24/2021   CKD (chronic kidney disease), stage IIIa (HCC) 09/24/2021   Sacral decubitus ulcer, stage II (HCC) 09/20/2021   Acute on chronic heart failure with preserved ejection fraction (HFpEF) (HCC) 09/19/2021   Nail disorder 07/07/2021   Pre-ulcerative corn or callous 07/07/2021   Rash 07/07/2021   Chronic diastolic CHF (congestive heart failure) (HCC) 03/17/2021   DM2 (diabetes mellitus, type 2) (HCC) 03/11/2021   Vitamin D  deficiency 01/01/2021   Medial epicondylitis 12/31/2020   Aortic atherosclerosis (HCC) 09/26/2020   Umbilical hernia 09/26/2020   Constipation 08/05/2020   Arthritis 06/12/2020   Hoarseness 05/01/2020   B12 deficiency 01/31/2020   Rosacea 01/31/2020   Neck swelling 01/31/2020   History of colonic polyps    Benign neoplasm of [place]    Nausea 08/31/2019   Throat pain 07/26/2019   HLD (hyperlipidemia) 05/24/2019   Leg cramps 05/24/2019   Lump in neck 05/24/2019   Left thyroid  nodule 01/25/2019   Jerking movements of extremities 07/20/2018   Balance disorder 07/20/2018   Right knee pain 03/21/2018   OSA (obstructive sleep apnea) 03/21/2018   Oxygen desaturation 03/21/2018   Urinary symptom or sign 03/21/2018   Lymphedema 01/25/2018   Gait disorder 01/25/2018   Dysphonia 12/07/2017   Encounter for well adult exam with abnormal findings 11/08/2017   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   Maxcy polyp 08/11/2016   Abdominal pain, epigastric    Dysphagia    [place] cancer screening    ACE-inhibitor cough 12/05/2015   Restless legs syndrome 10/08/2015   Venous stasis dermatitis of both lower extremities 04/26/2015   GERD without esophagitis 10/17/2014   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/17/2014   AR (allergic rhinitis) 10/17/2014   Fatty liver 10/17/2014   Chronic pain syndrome 09/19/2014   Post-traumatic osteoarthritis of both knees 09/19/2014   Spinal stenosis 09/19/2014   Depression, major, single episode, complete remission (HCC)  09/19/2014   Narcotic dependence (HCC) 09/19/2014   Acute hypoxic respiratory failure (HCC) 08/20/2014   False positive HIV serology 06/02/2013   Hypokalemia 06/29/2012   Spondylosis of 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 03/09/2012   Lumbar disc disease 03/09/2012   Morbid obesity with body mass index (BMI) of 50.0 to 59.9 in adult (HCC) 06/17/2011   Chronic low back pain 12/28/2008   GAD (generalized anxiety disorder) 12/06/2007   Depression 12/06/2007   Essential hypertension 12/06/2007   Gastroesophageal reflux disease without esophagitis 12/06/2007   LOW BACK PAIN 12/06/2007   Allergic rhinitis 08/10/2007   PCP:  Ileen [name], NP Pharmacy:   [facility] - [place], [place] - [address] [place] [address] Phone: [phone] Fax: [phone]     Social Drivers of Health (SDOH) Social History: SDOH Screenings   Food Insecurity: No Food Insecurity (09/01/2024)  Housing: Low Risk  (09/01/2024)  Transportation Needs: No Transportation Needs (09/01/2024)  Utilities: Not At Risk (09/01/2024)  Alcohol  Screen: Low Risk  (12/30/2021)  Depression (PHQ2-9): Medium Risk (01/30/2022)  Financial Resource Strain: Medium Risk (12/30/2021)  Physical Activity: Inactive (12/30/2021)  Social Connections: Socially Isolated (09/01/2024)  Stress: No Stress Concern Present (12/30/2021)  Tobacco Use: Medium Risk (09/01/2024)   SDOH Interventions:     Readmission Risk Interventions    09/04/2024  4:45 PM 05/03/2024   10:25 AM 04/30/2024    4:03 PM  Readmission Risk Prevention Plan  Transportation Screening Complete Complete Complete  Medication Review (RN Care Manager) Complete Complete Complete  PCP or Specialist appointment within 3-5 days of discharge Complete Complete Complete  HRI or Home Care Consult Complete Complete Complete  SW Recovery Care/Counseling Consult Complete Complete Complete  Palliative Care Screening Not  Applicable Not Applicable Not Applicable  Skilled Nursing Facility Complete Not Applicable

## 2024-09-04 NOTE — Plan of Care (Signed)

## 2024-09-05 ENCOUNTER — Other Ambulatory Visit: Payer: Self-pay | Admitting: Neurosurgery

## 2024-09-05 DIAGNOSIS — N3000 Acute cystitis without hematuria: Secondary | ICD-10-CM | POA: Diagnosis not present

## 2024-09-05 DIAGNOSIS — M4802 Spinal stenosis, cervical region: Secondary | ICD-10-CM | POA: Diagnosis not present

## 2024-09-05 DIAGNOSIS — R262 Difficulty in walking, not elsewhere classified: Secondary | ICD-10-CM | POA: Diagnosis not present

## 2024-09-05 LAB — GLUCOSE, CAPILLARY
Glucose-Capillary: 84 mg/dL (ref 70–99)
Glucose-Capillary: 77 mg/dL (ref 70–99)
Glucose-Capillary: 96 mg/dL (ref 70–99)
Glucose-Capillary: 116 mg/dL — ABNORMAL HIGH (ref 70–99)

## 2024-09-05 MED ORDER — OXYCODONE-ACETAMINOPHEN 7.5-325 MG PO TABS
1.0000 | ORAL_TABLET | Freq: Three times a day (TID) | ORAL | Status: DC
  Administered 2024-09-05 – 2024-09-07 (×7): 1 via ORAL
  Filled 2024-09-05 (×7): qty 1

## 2024-09-05 MED ORDER — ALPRAZOLAM 0.25 MG PO TABS
0.2500 mg | ORAL_TABLET | Freq: Every day | ORAL | Status: DC | PRN
  Administered 2024-09-08 – 2024-09-11 (×4): 0.25 mg via ORAL
  Filled 2024-09-05 (×4): qty 1

## 2024-09-05 NOTE — Progress Notes (Addendum)
 [facility] PROGRESS NOTE   Maria [name] FMW:[id number] DOB: [birth year] DOA: 09/01/2024  PCP: Ileen [name], NP  Brief History:  68 y.o. 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     Consultants: Neurosurgery  Procedures: None yet    Subjective/Interval History: Patient continues to complain of neck pain shoulder pain upper back pain.  Still feels quite weak.  Was not able to ambulate much with physical therapy yesterday.     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  She is able to move her lower extremities but significant weakness noted.  Unable to elicit reflexes due to body habitus. Presentation is likely multifactorial considering her morbid obesity, possible UTI, deconditioning.  Need to rule out acute spinal process. Patient with chronic lower back pain but worse over the last few weeks.  Denies any falls or injuries. Previous imaging studies reviewed.  She had a MRI of the cervical spine back in March which showed severe spinal stenosis and moderate to severe bilateral neuroforaminal stenosis at C4-C5.  Also noted in C3-C4 region. She was noted to have undergone MRI of the lumbar spine in 2024 which showed degenerative changes.  Moderate to severe spinal and lateral recess stenosis at L3-L4 and L4-L5 noted.  Disease also noted in L2-L3.  She underwent thoracic spine MRI at the same time which showed mild degenerative changes without any high-grade spinal stenosis. Underwent MRI of the cervical, thoracic and lumbar spine during this hospitalization.  Stenosis and degenerative changes noted at multiple  levels in the cervical thoracic and lumbar region.   Neurosurgery was consulted.  They do feel that her cervical disease is significant enough to be causing symptoms.  They plan surgical intervention but want to do it on an elective basis if possible. Patient was seen by PT and OT.  Her ambulatory status is still very poor.  She is completely off her baseline.  They have recommended short-term rehab at skilled nursing facility.  May need to undergo surgery prior to discharge. Discussed with Dr. Louis today. Tentative plan for surgery on Thursday. Will transfer to [facility]. Patient aware and agrees.  Concern for UTI Previous history of Klebsiella UTI in 2024.  Came in with frequent urination. Empirically on ceftriaxone . Urine culture without any growth.  WBC is normal.  Plan is for a 5-day treatment with ceftriaxone .  Repeat UA still pending.  Chronic diastolic CHF Torsemide  and spironolactone  on hold due to dehydration.  She was given IV fluids with improvement.  Hold off on IV fluids going forward.   Will resume diuretics at discharge or in a few days if she is still in the hospital.  Respiratory status is stable.  Chronic pain syndrome On oxycodone  at home which is being continued.  Oxycodone  dose was adjusted.  Still with a lot of pain and discomfort.  Will start her on scheduled Percocet 3 times a day and then make further adjustments as needed.   Bowel regimen.  Diabetes mellitus type 2 Monitor CBGs.  SSI.  HbA1c 5.0. She is on Mounjaro primarily for weight loss purposes.  She has lost about 60 pounds in the last few months.  Bilateral knee pain  X-rays showed significant arthritis without any acute findings.  Outpatient follow-up with orthopedics  Class III obesity Estimated body mass index is 54.75 kg/m as calculated from the following:   Height as of this encounter: 5' 3 (1.6 m).   Weight as of this encounter: 140.2 kg.   DVT Prophylaxis: Lovenox  Code Status: Full code Family  Communication: Discussed with patient Disposition Plan: SNF is recommended.  May need to undergo cervical spine surgery prior to discharge.   Medications: Scheduled:  cholecalciferol   1,000 Units Oral Daily   clonazePAM   1 mg Oral QHS   cyanocobalamin   1,000 mcg Oral Daily   enoxaparin  (LOVENOX ) injection  40 mg Subcutaneous Daily   famotidine   40 mg Oral QHS   fluticasone   1 spray Each Nare Daily   insulin  aspart  0-15 Units Subcutaneous TID WC   insulin  aspart  0-5 Units Subcutaneous QHS   metoprolol  succinate  25 mg Oral Daily   nystatin   1 Application Topical Daily   pantoprazole   40 mg Oral Daily   polyethylene glycol  17 g Oral Daily   potassium chloride  SA  40 mEq Oral Daily   pramipexole   0.5 mg Oral TID   senna-docusate  2 tablet Oral BID   Continuous:  cefTRIAXone  (ROCEPHIN )  IV      PRN:acetaminophen  **OR** acetaminophen , albuterol , methocarbamol , ondansetron  **OR** ondansetron  (ZOFRAN ) IV, oxyCODONE , traZODone   Antibiotics: Anti-infectives (From admission, onward)    Start     Dose/Rate Route Frequency Ordered Stop   09/05/24 0900  cefTRIAXone  (ROCEPHIN ) 1 g in sodium chloride  0.9 % 100 mL IVPB        1 g 200 mL/hr over 30 Minutes Intravenous Every 24 hours 09/04/24 1013 09/07/24 0859   09/02/24 0900  cefTRIAXone  (ROCEPHIN ) 1 g in sodium chloride  0.9 % 100 mL IVPB        1 g 200 mL/hr over 30 Minutes Intravenous Every 24 hours 09/01/24 1102 09/04/24 1000   09/01/24 0900  cefTRIAXone  (ROCEPHIN ) 1 g in sodium chloride  0.9 % 100 mL IVPB        1 g 200 mL/hr over 30 Minutes Intravenous  Once 09/01/24 0852 09/01/24 0948       Objective:  Vital Signs  Vitals:   09/04/24 0908 09/04/24 1411 09/04/24 2034 09/05/24 0505  BP: (!) 146/52 (!) 121/56 (!) 118/51 (!) 149/63  Pulse: 68 71 72 71  Resp:  16    Temp:  (!) 97.4 F (36.3 C) 98.2 F (36.8 C) 98 F (36.7 C)  TempSrc:      SpO2:  99% 95% 96%  Weight:      Height:        Intake/Output Summary (Last 24  hours) at 09/05/2024 0905 Last data filed at 09/05/2024 0500 Gross per 24 hour  Intake --  Output 850 ml  Net -850 ml    General appearance: Awake alert.  In no distress Resp: Clear to auscultation bilaterally.  Normal effort Cardio: S1-S2 is normal regular.  No S3-S4.  No rubs murmurs or bruit GI: Abdomen is soft.  Nontender nondistended.  Bowel sounds are present normal.  No masses organomegaly Extremities: No edema.  Limited range of motion of lower extremities.  Lab Results:  Data Reviewed: I have personally reviewed following labs and reports of the imaging studies  CBC: Recent Labs  Lab 09/01/24 0659 09/02/24 0506 09/03/24 0618  WBC 8.3 6.1 6.2  NEUTROABS 4.7  --   --   HGB 11.0* 11.4* 11.8*  HCT 35.1* 36.8 36.9  MCV 94.4 92.7 93.7  PLT 241 249 234    Basic Metabolic Panel: Recent Labs  Lab 09/01/24 0659 09/02/24 0506 09/03/24 0406 09/04/24 0349  NA 139 141 138 138  K 3.7 3.8 3.9 4.2  CL 101 98 99 100  CO2 27 29 29 27   GLUCOSE 94 94 95 105*  BUN 11 11 12 12   CREATININE 1.08* 1.34* 1.10* 0.90  CALCIUM  9.1 8.9 9.0 9.6     CBG: Recent Labs  Lab 09/04/24 0740 09/04/24 1206 09/04/24 1705 09/04/24 2034 09/05/24 0737  GLUCAP 107* 88 103* 114* 84    Radiology Studies: No results found.      LOS: 4 days   Alexsandro Salek [employer] on [email or web address]  09/05/2024, 9:05 AM

## 2024-09-05 NOTE — Plan of Care (Signed)

## 2024-09-05 NOTE — TOC Progression Note (Addendum)
 Transition of Care [facility]) - Progression Note    Patient Details  Name: Maria [name] MRN: [id number] Date of Birth: [birth year]  Transition [facility] Care [facility]) CM/SW Contact  Doneta Glenys DASEN, RN Phone Number: 09/05/2024, 9:14 AM  Clinical Narrative:    Choice list given. Service Provider Request Status STAR(s) Address Phone Patient Preferred  [facility] Preferred SNF  Accepted 2 [address], [place] [address] [phone]   [facility] SN[facility]  Accepted 2 [address] [place] [address] [phone]   [facility]  Accepted 1 [address] [place] [address] [phone]   [facility] SN[facility]  Accepted 1 [address] [place] [address] [phone]   [facility] Preferred SN[facility]  Accepted 1 [address], [place] [address] [phone]   12:37 PM Patient has chosen [facility]. Patient will be transferring to [facility] for spinal surgery tentatively on Thursday (9/18). [facility] updated.  Expected Discharge Plan: Skilled Nursing Facility Barriers to Discharge: Continued Medical Work up, SNF Pending bed offer               Expected Discharge Plan and Services In-house Referral: NA Discharge Planning Services: CM Consult   Living arrangements for the past 2 months: Apartment                 DME Arranged: N/A DME Agency: NA       HH Arranged: NA HH Agency: NA         Social Drivers of Health (SDOH) Interventions SDOH Screenings   Food Insecurity: No Food Insecurity (09/01/2024)  Housing: Low Risk  (09/01/2024)  Transportation Needs: No Transportation Needs (09/01/2024)  Utilities: Not At Risk (09/01/2024)  Alcohol  Screen: Low Risk  (12/30/2021)  Depression (PHQ2-9): Medium Risk (01/30/2022)  Financial Resource Strain: Medium Risk (12/30/2021)  Physical Activity: Inactive (12/30/2021)  Social Connections: Socially Isolated (09/01/2024)  Stress: No  Stress Concern Present (12/30/2021)  Tobacco Use: Medium Risk (09/01/2024)    Readmission Risk Interventions    09/04/2024    4:45 PM 05/03/2024   10:25 AM 04/30/2024    4:03 PM  Readmission Risk Prevention Plan  Transportation Screening Complete Complete Complete  Medication Review [employer]) Complete Complete Complete  PCP or Specialist appointment within 3-5 days of discharge Complete Complete Complete  HRI or Home Care Consult Complete Complete Complete  SW Recovery Care/Counseling Consult Complete Complete Complete  Palliative Care Screening Not Applicable Not Applicable Not Applicable  Skilled Nursing Facility Complete Not Applicable

## 2024-09-06 ENCOUNTER — Inpatient Hospital Stay (HOSPITAL_COMMUNITY)

## 2024-09-06 DIAGNOSIS — I5032 Chronic diastolic (congestive) heart failure: Secondary | ICD-10-CM | POA: Diagnosis not present

## 2024-09-06 DIAGNOSIS — N3000 Acute cystitis without hematuria: Secondary | ICD-10-CM | POA: Diagnosis not present

## 2024-09-06 DIAGNOSIS — G959 Disease of spinal cord, unspecified: Secondary | ICD-10-CM

## 2024-09-06 LAB — BASIC METABOLIC PANEL WITH GFR
Anion gap: 11 (ref 5–15)
BUN: 19 mg/dL (ref 8–23)
CO2: 26 mmol/L (ref 22–32)
Calcium: 9.3 mg/dL (ref 8.9–10.3)
Chloride: 103 mmol/L (ref 98–111)
Creatinine, Ser: 0.86 mg/dL (ref 0.44–1.00)
GFR, Estimated: >60 mL/min (ref >60)
Glucose, Bld: 106 mg/dL — ABNORMAL HIGH (ref 70–99)
Potassium: 4.5 mmol/L (ref 3.5–5.1)
Sodium: 139 mmol/L (ref 135–145)

## 2024-09-06 LAB — CBC
HCT: 38.8 % (ref 36.0–46.0)
Hemoglobin: 12.3 g/dL (ref 12.0–15.0)
MCH: 29.7 pg (ref 26.0–34.0)
MCHC: 31.7 g/dL (ref 30.0–36.0)
MCV: 93.7 fL (ref 80.0–100.0)
Platelets: 242 K/uL (ref 150–400)
RBC: 4.14 MIL/uL (ref 3.87–5.11)
RDW: 13.2 % (ref 11.5–15.5)
WBC: 6.8 K/uL (ref 4.0–10.5)
nRBC: 0 % (ref 0.0–0.2)

## 2024-09-06 LAB — GLUCOSE, CAPILLARY
Glucose-Capillary: 103 mg/dL — ABNORMAL HIGH (ref 70–99)
Glucose-Capillary: 87 mg/dL (ref 70–99)
Glucose-Capillary: 94 mg/dL (ref 70–99)
Glucose-Capillary: 103 mg/dL — ABNORMAL HIGH (ref 70–99)

## 2024-09-06 LAB — MAGNESIUM: Magnesium: 2.3 mg/dL (ref 1.7–2.4)

## 2024-09-06 MED ORDER — CHLORHEXIDINE GLUCONATE CLOTH 2 % EX PADS
6.0000 | MEDICATED_PAD | Freq: Once | CUTANEOUS | Status: AC
  Administered 2024-09-07: 6 via TOPICAL

## 2024-09-06 MED ORDER — CHLORHEXIDINE GLUCONATE CLOTH 2 % EX PADS
6.0000 | MEDICATED_PAD | Freq: Once | CUTANEOUS | Status: AC
  Administered 2024-09-06: 6 via TOPICAL

## 2024-09-06 NOTE — Progress Notes (Signed)
 PROGRESS NOTE   Maria [name]  FMW:[id number]    DOB: [birth year]    DOA: 09/01/2024  PCP: Ileen [name], NP   I have briefly reviewed patients previous medical records in [facility].   Brief Hospital Course:  68 y.o. 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  Diagnosed with cervical compression myelopathy.  Neuro surgery has evaluated and recommend transferring from [facility] to [facility] for anterior cervical decompression and fusion at C4-5 on 9/18 afternoon.  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  She is able to move her lower extremities but significant weakness noted.  Unable to elicit reflexes due to body habitus. Presentation is likely multifactorial considering her morbid obesity, possible UTI, deconditioning.  However her at this time it is felt that primary reason for her weakness is the compressive cervical myelopathy. Patient with chronic lower back pain but worse over the last few weeks.  Denies any falls or injuries. Previous imaging studies reviewed.  She had a MRI of the cervical spine back in March which showed severe spinal stenosis and moderate to severe bilateral neuroforaminal stenosis at C4-C5.  Also noted in C3-C4 region. She was noted to have undergone MRI of the lumbar spine in 2024 which showed degenerative changes.  Moderate to severe spinal and lateral recess stenosis at L3-L4 and L4-L5 noted.  Disease also noted in L2-L3.  She underwent thoracic spine MRI at the same time which showed mild degenerative changes without any  high-grade spinal stenosis. Underwent MRI of the cervical, thoracic and lumbar spine during this hospitalization.  Stenosis and degenerative changes noted at multiple levels in the cervical thoracic and lumbar region.   Neurosurgery was consulted.  They do feel that her cervical disease is significant enough to be causing symptoms.  They plan surgical intervention and initially wanted to do it on an elective basis if possible. Patient was seen by PT and OT.  Her ambulatory status is still very poor.  She is completely off her baseline.  They have recommended short-term rehab at skilled nursing facility.  May need to undergo surgery prior to discharge. Neurosurgery follow-up appreciated and given her severity of signs and symptoms, tentatively plan for anterior cervical decompression and fusion at C4-5 at [facility] on 9/1 afternoon.  Transfer order was placed yesterday but patient is still waiting on a bed for transfer.  Discussed with patient's RN. I have consulted patient's primary cardiology team for preop clearance.   Concern for UTI Previous history of Klebsiella UTI in 2024.  Came in with frequent urination. Empirically on ceftriaxone .  Urine culture from 9/13 showed multiple species, likely contaminant. No fever or leukocytosis since hospital admission.  Has completed 6 days of IV ceftriaxone  and has more than completed course for UTI.  Discontinued ceftriaxone .   Chronic diastolic CHF Torsemide  and spironolactone  on hold due to dehydration.  She was given IV fluids with improvement.  Hold off on IV fluids going forward.   Will resume diuretics at discharge or in a few days if she is still in the hospital.  Respiratory status is stable.  As stated above, consulted patient's primary cardiology team for preop clearance.  TTE 02/09/2023 showed LVEF of 65-70%, moderate concentric LVH, grade 2 diastolic dysfunction, small pericardial effusion, large left pleural effusion.  Will order a chest  x-ray for review as well.   Chronic pain syndrome On oxycodone  at home which is being continued.  Oxycodone  dose was adjusted.  Still with a lot of pain and discomfort.  She was started on scheduled Percocet 9/16 and then make further adjustments as needed.   Bowel regimen. Monitor closely.   Diabetes mellitus type 2 Monitor CBGs.  SSI.  HbA1c 5.0.  Good inpatient control. She is on Mounjaro primarily for weight loss purposes.  She has lost about 60 pounds in the last few months.   Bilateral knee pain X-rays showed significant arthritis without any acute findings.  Outpatient follow-up with orthopedics    Body mass index is 54.75 kg/m./Very morbid obesity Complicates care.   DVT prophylaxis: enoxaparin  (LOVENOX ) injection 40 mg Start: 09/01/24 1200     Code Status: Full Code:  Family Communication: None at bedside. Disposition:  Status is: Inpatient Remains inpatient appropriate because: Awaiting transfer from [facility] to [facility] for back surgery on 9/18.     Consultants:   Neurosurgery/Dr. [facility] Gunn[facility]  Procedures:     Subjective:  Patient reports left-sided upper back/shoulder and neck pain but denies any weakness, tingling or numbness in left upper extremity.  Also reports right-sided lower back pain.  Somewhat better after pain meds but once they wear off, has significant pain.  Lives at home with spouse, ambulates with the help of a walker, reports dyspnea on exertion but no chest pain.  Apart from CHF history, denies any other cardiorespiratory symptoms.  Does not use home oxygen or CPAP.  States that her most recent weight was supposed to be 297 pounds but currently at 309 pounds on 9/14.  Follows with Dr. Lavona, cardiology.  Objective:   Vitals:   09/05/24 1248 09/05/24 2000 09/06/24 0543 09/06/24 0939  BP: (!) 102/52 (!) 135/51 (!) 140/55 (!) 138/58  Pulse: 64 68 65 67  Resp: 16 18 20    Temp: (!) 97.4 F (36.3 C) 97.8 F  (36.6 C) 97.6 F (36.4 C)   TempSrc: Oral Oral    SpO2: 99% 97% 99%   Weight:      Height:        General exam: Middle-age female, moderately built and morbidly obese lying comfortably propped up in bed without distress. Respiratory system: Clear to auscultation. Respiratory effort normal. Cardiovascular system: S1 & S2 heard, RRR. No JVD, murmurs, rubs, gallops or clicks.  Trace bilateral ankle edema. Gastrointestinal system: Abdomen is nondistended, soft and nontender. No organomegaly or masses felt. Normal bowel sounds heard. Central nervous system: Alert and oriented. No focal neurological deficits. Extremities: At least grade 3 x 5 power in lower extremities.  Evaluation restricted due to low back pain. Skin: No rashes, lesions or ulcers Psychiatry: Judgement and insight appear normal. Mood & affect appropriate.     Data Reviewed:   I have personally reviewed following labs and imaging studies   CBC: Recent Labs  Lab 09/01/24 0659 09/02/24 0506 09/03/24 0618 09/06/24 0425  WBC 8.3 6.1 6.2 6.8  NEUTROABS 4.7  --   --   --   HGB 11.0* 11.4* 11.8* 12.3  HCT 35.1* 36.8 36.9 38.8  MCV 94.4 92.7 93.7 93.7  PLT 241 249 234 242    Basic Metabolic  Panel: Recent Labs  Lab 09/01/24 0659 09/02/24 0506 09/03/24 0406 09/04/24 0349 09/06/24 0425  NA 139 141 138 138 139  K 3.7 3.8 3.9 4.2 4.5  CL 101 98 99 100 103  CO2 27 29 29 27 26   GLUCOSE 94 94 95 105* 106*  BUN 11 11 12 12 19   CREATININE 1.08* 1.34* 1.10* 0.90 0.86  CALCIUM  9.1 8.9 9.0 9.6 9.3  MG  --   --   --   --  2.3    Liver Function Tests: Recent Labs  Lab 09/03/24 0406  AST 13*  ALT 7  ALKPHOS 69  BILITOT 0.9  PROT 6.0*  ALBUMIN  3.5    CBG: Recent Labs  Lab 09/05/24 1622 09/05/24 2013 09/06/24 0735  GLUCAP 96 116* 103*    Microbiology Studies:   Recent Results (from the past 240 hours)  Urine Culture     Status: Abnormal   Collection Time: 09/01/24  7:38 AM   Specimen: Urine, Clean  Catch  Result Value Ref Range Status   Specimen Description   Final    URINE, CLEAN CATCH Performed at [facility], [address], [place], [place] [address]    Special Requests   Final    NONE Performed at [facility], [address] [place], [place] [address]    Culture MULTIPLE SPECIES PRESENT, SUGGEST RECOLLECTION (A)  Final   Report Status 09/02/2024 FINAL  Final    Radiology Studies:  No results found.  Scheduled Meds:    cholecalciferol   1,000 Units Oral Daily   clonazePAM   1 mg Oral QHS   cyanocobalamin   1,000 mcg Oral Daily   enoxaparin  (LOVENOX ) injection  40 mg Subcutaneous Daily   famotidine   40 mg Oral QHS   fluticasone   1 spray Each Nare Daily   insulin  aspart  0-15 Units Subcutaneous TID WC   insulin  aspart  0-5 Units Subcutaneous QHS   metoprolol  succinate  25 mg Oral Daily   nystatin   1 Application Topical Daily   oxyCODONE -acetaminophen   1 tablet Oral TID   pantoprazole   40 mg Oral Daily   polyethylene glycol  17 g Oral Daily   potassium chloride  SA  40 mEq Oral Daily   pramipexole   0.5 mg Oral TID   senna-docusate  2 tablet Oral BID    Continuous Infusions:    cefTRIAXone  (ROCEPHIN )  IV 1 g (09/06/24 0939)     LOS: 5 days     Trenda Mar, MD,  FACP, [facility], [facility], [name]   [facility] [place]      To contact the attending provider between 7A-7P or the covering provider during after hours 7P-7A, please log into the web site [email or web address] and access using universal [place] password for that web site. If you do not have the password, please call the hospital operator.  09/06/2024, 9:58 AM

## 2024-09-06 NOTE — Plan of Care (Signed)

## 2024-09-06 NOTE — H&P (View-Only) (Signed)
 Patient continues to mobilize poorly with complaints of weakness and gait instability.  UTI is felt to not be a significant issue.  She is medically optimized.  With this in mind the patient has significant cervical stenosis at C4-5 with ongoing cord compression and symptoms referable to this.  We have previously discussed anterior cervical decompression and fusion at C4-5 and the plan is to move forward with this at [facility] on Thursday afternoon.  Patient will hopefully be transferred to [facility] prior to surgery.

## 2024-09-06 NOTE — Progress Notes (Signed)
 Patient continues to mobilize poorly with complaints of weakness and gait instability.  UTI is felt to not be a significant issue.  She is medically optimized.  With this in mind the patient has significant cervical stenosis at C4-5 with ongoing cord compression and symptoms referable to this.  We have previously discussed anterior cervical decompression and fusion at C4-5 and the plan is to move forward with this at [facility] on Thursday afternoon.  Patient will hopefully be transferred to [facility] prior to surgery.

## 2024-09-06 NOTE — Plan of Care (Signed)
 Problem: Education: Goal: Knowledge of General Education information will improve Description: Including pain rating scale, medication(s)/side effects and non-pharmacologic comfort measures 09/06/2024 1732 by Kingston Gin, RN Outcome: Progressing 09/06/2024 1709 by Kingston Gin, RN Outcome: Progressing   Problem: Health Behavior/Discharge Planning: Goal: Ability to manage health-related needs will improve 09/06/2024 1732 by Kingston Gin, RN Outcome: Progressing 09/06/2024 1709 by Kingston Gin, RN Outcome: Progressing   Problem: Clinical Measurements: Goal: Ability to maintain clinical measurements within normal limits will improve 09/06/2024 1732 by Kingston Gin, RN Outcome: Progressing 09/06/2024 1709 by Kingston Gin, RN Outcome: Progressing Goal: Will remain free from infection 09/06/2024 1732 by Kingston Gin, RN Outcome: Progressing 09/06/2024 1709 by Kingston Gin, RN Outcome: Progressing Goal: Diagnostic test results will improve 09/06/2024 1732 by Kingston Gin, RN Outcome: Progressing 09/06/2024 1709 by Kingston Gin, RN Outcome: Progressing Goal: Respiratory complications will improve 09/06/2024 1732 by Kingston Gin, RN Outcome: Progressing 09/06/2024 1709 by Kingston Gin, RN Outcome: Progressing Goal: Cardiovascular complication will be avoided 09/06/2024 1732 by Kingston Gin, RN Outcome: Progressing 09/06/2024 1709 by Kingston Gin, RN Outcome: Progressing   Problem: Activity: Goal: Risk for activity intolerance will decrease 09/06/2024 1732 by Kingston Gin, RN Outcome: Progressing 09/06/2024 1709 by Kingston Gin, RN Outcome: Progressing   Problem: Nutrition: Goal: Adequate nutrition will be maintained 09/06/2024 1732 by Kingston Gin, RN Outcome: Progressing 09/06/2024 1709 by Kingston Gin, RN Outcome: Progressing   Problem: Coping: Goal: Level of anxiety will decrease 09/06/2024 1732 by Kingston Gin, RN Outcome: Progressing 09/06/2024 1709 by Kingston Gin, RN Outcome: Progressing   Problem: Elimination: Goal: Will not experience complications related to bowel motility 09/06/2024 1732 by Kingston Gin, RN Outcome: Progressing 09/06/2024 1709 by Kingston Gin, RN Outcome: Progressing Goal: Will not experience complications related to urinary retention 09/06/2024 1732 by Kingston Gin, RN Outcome: Progressing 09/06/2024 1709 by Kingston Gin, RN Outcome: Progressing   Problem: Pain Managment: Goal: General experience of comfort will improve and/or be controlled 09/06/2024 1732 by Kingston Gin, RN Outcome: Progressing 09/06/2024 1709 by Kingston Gin, RN Outcome: Progressing   Problem: Safety: Goal: Ability to remain free from injury will improve 09/06/2024 1732 by Kingston Gin, RN Outcome: Progressing 09/06/2024 1709 by Kingston Gin, RN Outcome: Progressing   Problem: Skin Integrity: Goal: Risk for impaired skin integrity will decrease 09/06/2024 1732 by Kingston Gin, RN Outcome: Progressing 09/06/2024 1709 by Kingston Gin, RN Outcome: Progressing   Problem: Education: Goal: Ability to describe self-care measures that may prevent or decrease complications (Diabetes Survival Skills Education) will improve 09/06/2024 1732 by Kingston Gin, RN Outcome: Progressing 09/06/2024 1709 by Kingston Gin, RN Outcome: Progressing Goal: Individualized Educational Video(s) 09/06/2024 1732 by Kingston Gin, RN Outcome: Progressing 09/06/2024 1709 by Kingston Gin, RN Outcome: Progressing   Problem: Coping: Goal: Ability to adjust to condition or change in health will improve 09/06/2024 1732 by Kingston Gin, RN Outcome: Progressing 09/06/2024 1709 by Kingston Gin, RN Outcome: Progressing   Problem: Fluid Volume: Goal: Ability to maintain a balanced intake and output will improve 09/06/2024 1732 by Kingston Gin,  RN Outcome: Progressing 09/06/2024 1709 by Kingston Gin, RN Outcome: Progressing   Problem: Health Behavior/Discharge Planning: Goal: Ability to identify and utilize available resources and services will improve 09/06/2024 1732 by Kingston Gin, RN Outcome: Progressing 09/06/2024 1709 by Kingston Gin, RN Outcome: Progressing Goal: Ability to manage health-related needs will improve 09/06/2024 1732 by Kingston Gin, RN Outcome: Progressing 09/06/2024 1709 by Kingston Gin, RN Outcome: Progressing   Problem: Metabolic: Goal: Ability to maintain appropriate glucose  levels will improve 09/06/2024 1732 by Kingston Gin, RN Outcome: Progressing 09/06/2024 1709 by Kingston Gin, RN Outcome: Progressing   Problem: Nutritional: Goal: Maintenance of adequate nutrition will improve 09/06/2024 1732 by Kingston Gin, RN Outcome: Progressing 09/06/2024 1709 by Kingston Gin, RN Outcome: Progressing Goal: Progress toward achieving an optimal weight will improve 09/06/2024 1732 by Kingston Gin, RN Outcome: Progressing 09/06/2024 1709 by Kingston Gin, RN Outcome: Progressing   Problem: Skin Integrity: Goal: Risk for impaired skin integrity will decrease 09/06/2024 1732 by Kingston Gin, RN Outcome: Progressing 09/06/2024 1709 by Kingston Gin, RN Outcome: Progressing   Problem: Tissue Perfusion: Goal: Adequacy of tissue perfusion will improve 09/06/2024 1732 by Kingston Gin, RN Outcome: Progressing 09/06/2024 1709 by Kingston Gin, RN Outcome: Progressing

## 2024-09-06 NOTE — Consult Note (Addendum)
 Cardiology Consultation   Patient ID: Liliyana Thobe Vandermeer MRN: [id number]; DOB: [birth year]  Admit date: 09/01/2024 Date of Consult: 09/06/2024  PCP:  Ileen [name], NP   [facility] Providers Cardiologist:  Lynwood Schilling, MD        Patient Profile: Delva Derden Weger is a 68 y.o. female with a hx of chronic diastolic heart failure, hypertension, hyperlipidemia, GERD, anxiety/depression, and T2DM who is being seen 09/06/2024 for the evaluation of pre-operative evaluation at the request of Trenda Mar MD.  History of Present Illness: Ms. Zufall has been followed by Dr. Schilling since 2022 for her chronic diastolic heart failure. She has had several hospitalizations for heart failure exacerbations with the most recent May 2025. She followed up with Dr. Schilling after this admission on 05/18/2024. At that visit patient was doing well, and she had been working on weight loss.  Most recent echocardiogram 01/2023 showed LVED 65-70% with G2 DD and moderately elevated PASP. Moderately dilated LA. Small pericardial effusion. Trivial MR. Left pleural effusion.   She presented to the ED on9/12 for difficulty ambulating 2/2 progressive weakness.  BP: 143/59  HR 79 ECG was not obtained.  Pertinent lab work on admission: Cr. 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  Neurosurgery consulted and planning an anterior cervical decompression and fusion to C4-C5.  CXR obtained today unremarkable  On interview patient shares she has been doing really well from a heart stand point since her last visit with Dr. Lindi, she is very excited to see him in the hospital.  Denied lightheadedness/dizziness, chest pain, palpitations, and shortness of breath. Her peripheral edema is still intermittent,  though no issues this admission, shares they are currently the best they have been in awhile.   At home she lives by herself in an ADA accessible apartment. She is able to do her own house chores though is limited by back pain. Her back pain also prevents her from walking up the stairs. She does not have chest pain or shortness of breath on exertion. Blood pressure at home has been good, ~120/70.     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 2010   CERVICAL DISC SURGERY  may 24th 2010   COLONOSCOPY WITH PROPOFOL  N/A 07/10/2016   Procedure: COLONOSCOPY WITH PROPOFOL ;  Surgeon: Elspeth Deward Naval, MD;  Location: [facility];  Service: Gastroenterology;  Laterality: N/A;   COLONOSCOPY WITH PROPOFOL  N/A 09/26/2019   Procedure: COLONOSCOPY WITH  PROPOFOL ;  Surgeon: Leigh Elspeth SQUIBB, MD;  Location: [facility];  Service: Gastroenterology;  Laterality: N/A;   ESOPHAGOGASTRODUODENOSCOPY (EGD) WITH PROPOFOL  N/A 07/10/2016   Procedure: ESOPHAGOGASTRODUODENOSCOPY (EGD) WITH PROPOFOL ;  Surgeon: Elspeth Deward Leigh, MD;  Location: [facility];  Service: Gastroenterology;  Laterality: N/A;    POLYPECTOMY  09/26/2019   Procedure: POLYPECTOMY;  Surgeon: Leigh Elspeth SQUIBB, MD;  Location: [facility];  Service: Gastroenterology;;   RADIOLOGY WITH ANESTHESIA N/A 07/30/2023   Procedure: MRI WITH ANESTHESIA - THORACIC LUMBAR WITHOUT CONSTRAST;  Surgeon: Radiologist, Medication, MD;  Location: [facility];  Service: Radiology;  Laterality: N/A;   s/p ovary cyst     s/p right knee arthroscopy     Dr. Josefina ortho       Scheduled Meds:  cholecalciferol   1,000 Units Oral Daily   clonazePAM   1 mg Oral QHS   cyanocobalamin   1,000 mcg Oral Daily   enoxaparin  (LOVENOX ) injection  40 mg Subcutaneous Daily   famotidine   40 mg Oral QHS   fluticasone   1 spray Each Nare Daily   insulin  aspart  0-15 Units Subcutaneous TID WC   insulin  aspart  0-5 Units Subcutaneous QHS   metoprolol  succinate  25 mg Oral Daily   nystatin   1 Application Topical Daily   oxyCODONE -acetaminophen   1 tablet Oral TID   pantoprazole   40 mg Oral Daily   polyethylene glycol  17 g Oral Daily   potassium chloride  SA  40 mEq Oral Daily   pramipexole   0.5 mg Oral TID   senna-docusate  2 tablet Oral BID   Continuous Infusions:  PRN Meds: acetaminophen  **OR** acetaminophen , albuterol , ALPRAZolam , methocarbamol , ondansetron  **OR** ondansetron  (ZOFRAN ) IV, oxyCODONE , traZODone   Allergies:    Allergies  Allergen Reactions   Amoxicillin -Pot Clavulanate Nausea And Vomiting and Other (See Comments)    Projectile vomiting   Adhesive [Tape] Other (See Comments)    Tears skin off - use paper tape    Codeine Other (See Comments)    hallucinations   Crestor  [Rosuvastatin  Calcium ] Other (See Comments)    Severe muscle cramps   Cyclobenzaprine  Other (See Comments)    Hallucinations    Cymbalta  [Duloxetine  Hcl] Other (See Comments)    Hallucinations    Gabapentin  Other (See Comments)    Trembling   Lexapro [Escitalopram] Other (See Comments)    Hallucinations    Lyrica [Pregabalin] Other (See Comments)    weakness    Morphine  And Codeine Nausea And Vomiting and Other (See Comments)    Migraine headaches   Vicodin [Hydrocodone-Acetaminophen ] Other (See Comments)    hallucinations   Latex Rash    Social History:   Social History   Socioeconomic History   Marital status: Divorced    Spouse name: Not on file   Number of children: 2   Years of education: 14   Highest education level: Not on file  Occupational History   Occupation: disabled  Tobacco Use   Smoking status: Former    Current packs/day: 0.00    Types: Cigarettes    Start date: 11/08/1978    Quit date: 11/08/2008    Years since quitting: 15.8   Smokeless tobacco: Never   Tobacco comments:    quit 10/09  Vaping Use   Vaping status: Never Used  Substance and Sexual Activity   Alcohol  use: No    Comment: quit drinking 10/07/1997   Drug use: No   Sexual activity: Not Currently    Birth control/protection: Surgical, Abstinence  Other Topics Concern  Not on file  Social History Narrative   Patient has difficulty financially affording food, medication, and affording essential bills. She states she has limited support from her daughter.    Right handed   One floor apartment   Drinks caffeine prn   Lives alone      Social Drivers of Health   Financial Resource Strain: Medium Risk (12/30/2021)   Overall Financial Resource Strain (CARDIA)    Difficulty of Paying Living Expenses: Somewhat hard  Food Insecurity: No Food Insecurity (09/01/2024)   Hunger Vital Sign    Worried About Running Out of Food in the Last Year: Never true    Ran Out of Food in the Last Year: Never true  Transportation Needs: No Transportation Needs (09/01/2024)   PRAPARE - Administrator, Civil Service (Medical): No    Lack of Transportation (Non-Medical): No  Physical Activity: Inactive (12/30/2021)   Exercise Vital Sign    Days of Exercise per Week: 0 days    Minutes of Exercise per Session: 0 min  Stress: No Stress Concern Present  (12/30/2021)   [employer] of Occupational Health - Occupational Stress Questionnaire    Feeling of Stress : Not at all  Social Connections: Socially Isolated (09/01/2024)   Social Connection and Isolation Panel    Frequency of Communication with Friends and Family: More than three times a week    Frequency of Social Gatherings with Friends and Family: Once a week    Attends Religious Services: Never    Database administrator or Organizations: No    Attends Banker Meetings: Never    Marital Status: Divorced  Catering manager Violence: Not At Risk (09/01/2024)   Humiliation, Afraid, Rape, and Kick questionnaire    Fear of Current or Ex-Partner: No    Emotionally Abused: No    Physically Abused: No    Sexually Abused: No    Family History:   Family History  Problem Relation Age of Onset   Heart disease Mother    Hypertension Mother    Diabetes Mother    Heart failure Mother    Asthma Sister    Anxiety disorder Sister    Depression Sister    Hypertension Father    Asthma Daughter    Bipolar disorder Daughter    Cancer Maternal Uncle        Doiron   Cancer Other        ovarian   Liver cancer Paternal Grandmother        ????     ROS:  Please see the history of present illness.  All other ROS reviewed and negative.     Physical Exam/Data: Vitals:   09/05/24 1248 09/05/24 2000 09/06/24 0543 09/06/24 0939  BP: (!) 102/52 (!) 135/51 (!) 140/55 (!) 138/58  Pulse: 64 68 65 67  Resp: 16 18 20    Temp: (!) 97.4 F (36.3 C) 97.8 F (36.6 C) 97.6 F (36.4 C)   TempSrc: Oral Oral    SpO2: 99% 97% 99%   Weight:      Height:        Intake/Output Summary (Last 24 hours) at 09/06/2024 1126 Last data filed at 09/06/2024 0754 Gross per 24 hour  Intake 240 ml  Output 1400 ml  Net -1160 ml      09/03/2024    9:21 AM 07/07/2024    9:47 AM 05/18/2024   11:07 AM  Last 3 Weights  Weight (lbs) 309 lb 1.4 oz  309 lb 299 lb  Weight (kg) 140.2 kg 140.161 kg 135.626  kg     Body mass index is 54.75 kg/m.  General:  Very pleasant woman laying in bed in no acute distress HEENT: normal Neck: no JVD, though hard to assess 2/2 body habitus Vascular: Distal pulses 2+ bilaterally Cardiac:  RRR; 1/6 systolic murmur heard best at apex Lungs:  clear to auscultation bilaterally, no wheezing, rhonchi or rales  Abd: soft, nontender, no hepatomegaly  Ext: no edema Musculoskeletal:  No deformities, BUE and BLE strength normal and equal Skin: warm and dry  Neuro:  CNs 2-12 intact, no focal abnormalities noted Psych:  Normal affect   EKG:  The EKG was personally reviewed and demonstrates: NSR VR 68 Telemetry:  Telemetry was personally reviewed and demonstrates:  not available on floor  Relevant CV Studies: Echocardiogram 01/2023 IMPRESSIONS     1. Left ventricular ejection fraction, by estimation, is 65 to 70%. The  left ventricle has normal function. Left ventricular endocardial border  not optimally defined to evaluate regional wall motion. The left  ventricular internal cavity size was mildly  dilated. There is moderate concentric left ventricular hypertrophy. Left  ventricular diastolic parameters are consistent with Grade II diastolic  dysfunction (pseudonormalization). Elevated left atrial pressure.   2. Right ventricular systolic function is normal. The right ventricular  size is normal. There is moderately elevated pulmonary artery systolic  pressure. The estimated right ventricular systolic pressure is 47.0 mmHg.   3. Left atrial size was moderately dilated.   4. A small pericardial effusion is present. The pericardial effusion is  circumferential. Large pleural effusion in the left lateral region. No  evidence of increased pericardial pressure.   5. The mitral valve is normal in structure. Trivial mitral valve  regurgitation. No evidence of mitral stenosis.   6. The aortic valve is tricuspid. Aortic valve regurgitation is not  visualized. No  aortic stenosis is present.   7. The inferior vena cava is dilated in size with <50% respiratory  variability, suggesting right atrial pressure of 15 mmHg   Laboratory Data: Chemistry Recent Labs  Lab 09/03/24 0406 09/04/24 0349 09/06/24 0425  NA 138 138 139  K 3.9 4.2 4.5  CL 99 100 103  CO2 29 27 26   GLUCOSE 95 105* 106*  BUN 12 12 19   CREATININE 1.10* 0.90 0.86  CALCIUM  9.0 9.6 9.3  MG  --   --  2.3  GFRNONAA 55* >60 >60  ANIONGAP 10 11 11     Recent Labs  Lab 09/03/24 0406  PROT 6.0*  ALBUMIN  3.5  AST 13*  ALT 7  ALKPHOS 69  BILITOT 0.9   Hematology Recent Labs  Lab 09/02/24 0506 09/03/24 0618 09/06/24 0425  WBC 6.1 6.2 6.8  RBC 3.97 3.94 4.14  HGB 11.4* 11.8* 12.3  HCT 36.8 36.9 38.8  MCV 92.7 93.7 93.7  MCH 28.7 29.9 29.7  MCHC 31.0 32.0 31.7  RDW 13.3 12.9 13.2  PLT 249 234 242   Radiology/Studies:  DG CHEST PORT 1 VIEW Result Date: 09/06/2024 CLINICAL DATA:  Preop for cervical spine surgery. EXAM: PORTABLE CHEST 1 VIEW COMPARISON:  Apr 26, 2024. FINDINGS: Stable cardiomegaly. Both lungs are clear. The visualized skeletal structures are unremarkable. IMPRESSION: No active disease. Electronically Signed   By: Lynwood Landy Raddle M.D.   On: 09/06/2024 10:59   MR LUMBAR SPINE WO CONTRAST Result Date: 09/02/2024 CLINICAL DATA:  Initial evaluation for lower back pain, ambulatory dysfunction. EXAM: MRI  LUMBAR SPINE WITHOUT CONTRAST TECHNIQUE: Multiplanar, multisequence MR imaging of the lumbar spine was performed. No intravenous contrast was administered. COMPARISON:  Prior study from 07/30/2023. FINDINGS: Segmentation: Transitional features about the lumbosacral junction with partial sacralization of the L5 vertebral body. Alignment: 3 mm facet mediated anterolisthesis of L3 on L4. Alignment otherwise normal with preservation of the normal lumbar lordosis. Vertebrae: Vertebral body height maintained without acute or chronic fracture. Bone marrow signal intensity  within normal limits. No worrisome osseous lesions. No abnormal marrow edema. Conus medullaris and cauda equina: Conus extends to the L1 level. Conus medullaris within normal limits. Nerve roots of the cauda equina are somewhat irregular proximal to the L3-4 level due to stenosis. Paraspinal and other soft tissues: Paraspinous soft tissues within normal limits. Disc levels: A degree of underlying congenital spinal stenosis noted. L1-2: Negative interspace. Mild right worse than left facet arthrosis. No spinal stenosis. Foramina remain patent. L2-3: Disc desiccation with mild diffuse disc bulge. Mild facet and ligament flavum hypertrophy with associated trace joint effusions. Resultant mild-to-moderate spinal stenosis with moderate bilateral L2 foraminal narrowing. L3-4: Trace anterolisthesis. Disc desiccation with diffuse disc bulge. Superimposed right foraminal to extraforaminal disc protrusion with annular fissure (series 4, image 22). Advanced right worse than left facet arthrosis with small joint effusion on the right. Resultant severe multifactorial spinal stenosis, with the thecal sac measuring 4 mm in AP diameter. Moderate bilateral L3 foraminal narrowing. L4-5: Diffuse disc bulge with disc desiccation. Mild reactive endplate spurring. Superimposed broad-based right subarticular to foraminal disc protrusion with annular fissure (series 4, image 26). Severe left with mild-to-moderate right facet arthrosis. Ligamentum flavum hypertrophy. Resultant severe multifactorial spinal stenosis. Moderate right with moderate to severe left L4 foraminal stenosis. L5-S1: Negative interspace. Mild facet spurring. No canal or foraminal stenosis. IMPRESSION: 1. No acute abnormality within the lumbar spine. 2. 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 3. Disc bulge with facet arthrosis at L2-3 with  resultant mild-to-moderate spinal stenosis, with moderate bilateral L2 foraminal narrowing. Electronically Signed   By: Morene Hoard M.D.   On: 09/02/2024 23:11   MR THORACIC SPINE WO CONTRAST Result Date: 09/02/2024 CLINICAL DATA:  Initial evaluation for ataxia, thoracic pathology suspected. EXAM: MRI THORACIC SPINE WITHOUT CONTRAST TECHNIQUE: Multiplanar, multisequence MR imaging of the thoracic spine was performed. No intravenous contrast was administered. COMPARISON:  None Available. FINDINGS: Alignment: Mild dextroscoliosis with exaggeration of the normal thoracic kyphosis. No listhesis. Vertebrae: Vertebral body height maintained without acute or chronic fracture. Bone marrow signal intensity within normal limits. No worrisome osseous lesions. No abnormal marrow edema. Cord:  Normal signal and morphology. Paraspinal and other soft tissues: Paraspinous soft tissues within normal limits. Trace layering bilateral pleural effusions noted. Disc levels: T5-6: Small left paracentral to foraminal disc protrusion. No stenosis. T8-9: Disc bulge with small right greater than left paracentral disc protrusions indenting the ventral thecal sac. No significant spinal stenosis. Foramina remain patent. Otherwise, minor for age degenerative disc disease elsewhere within the thoracic spine no other significant disc bulge or focal disc herniation. Mild multilevel facet arthrosis. No significant spinal stenosis. Foramina remain patent. No neural impingement. IMPRESSION: 1. Normal MRI appearance of the thoracic spinal cord. No cord signal changes to suggest myelopathy. 2. Mild for age degenerative spondylosis without significant stenosis or neural impingement. 3. Trace layering bilateral pleural effusions. Electronically Signed   By: Morene Hoard M.D.   On: 09/02/2024 21:55  MR CERVICAL SPINE WO CONTRAST Result Date: 09/02/2024 CLINICAL DATA:  Initial evaluation for chronic neck pain, ambulatory dysfunction.  EXAM: MRI CERVICAL SPINE WITHOUT CONTRAST TECHNIQUE: Multiplanar, multisequence MR imaging of the cervical spine was performed. No intravenous contrast was administered. COMPARISON:  Prior MRI from 02/21/2024. FINDINGS: Alignment: Straightening of the normal cervical lordosis. 2 mm degenerative retrolisthesis of C4 on C5. Vertebrae: Prior ACDF at C5-C7 with solid arthrodesis. Vertebral body height maintained without acute or chronic fracture. No worrisome osseous lesions. Prominent degenerative reactive endplate change with marrow edema present about the C4-5 interspace. Reactive marrow edema also noted about the left C3-4 facet due to active facet arthritis (series 8, image 14). Cord: Cord flattening with patchy cord signal abnormality involving the cord at the level of C4-5, which could reflect edema and/or myelomalacia, greater on the left (series 9, image 14). These changes were present on prior MRI, although are somewhat more prominent on today's exam. Otherwise normal signal morphology. Posterior Fossa, vertebral arteries, paraspinal tissues: Visualized brain and posterior fossa within normal limits. Craniocervical junction normal. Paraspinous soft tissues within normal limits. Normal flow voids seen within the vertebral arteries bilaterally. Disc levels: C2-C3: Negative interspace. Mild right greater than left facet arthrosis. No canal or foraminal stenosis. C3-C4: Disc bulge with left greater than right uncovertebral spurring. Severe left with mild right facet arthrosis. Resultant mild-to-moderate spinal stenosis. Mild right with severe left C4 foraminal narrowing. C4-C5: Degenerative intervertebral disc space narrowing with retrolisthesis. Diffuse disc bulge with bilateral uncovertebral spurring. Slight inferior migration of disc material noted. Superimposed left greater than right facet hypertrophy. Resultant severe multifactorial spinal stenosis with severe bilateral foraminal narrowing. Secondary cord  flattening with associated patchy cord signal changes as above. Thecal sac measures 4 mm in AP diameter at its most narrow point. This is similar to prior. C5-C6: Prior fusion. Right paracentral endplate spurring indents the ventral thecal sac with residual mild cord flattening, but no significant spinal stenosis. Foramina appear patent. C6-C7: Prior fusion. No residual spinal stenosis. Foramina appear patent. C7-T1: Disc desiccation with mild uncovertebral spurring, but no significant disc bulge. Mild right greater than left facet arthrosis. No spinal stenosis. Mild left with moderate right C8 foraminal narrowing, stable. IMPRESSION: 1. Prior ACDF at C5-C7 without residual or recurrent spinal stenosis. 2. 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 Associated cord flattening with patchy cord signal abnormality at this level, which could reflect edema and/or myelomalacia. Overall, changes are similar as compared to previous MRI from 02/21/2024. 3. Disc bulge with uncovertebral and facet arthrosis at C3-4 with resultant mild to moderate spinal stenosis, with severe left C4 foraminal narrowing. 4. Changes of active facet arthritis about the left C3-4 facet, with additional prominent degenerative reactive marrow edema about the C4-5 interspace. Findings could contribute to neck pain. Electronically Signed   By: Morene Hoard M.D.   On: 09/02/2024 21:48    Assessment and Plan: Pre-operative Evaluation Patient to have surgery tomorrow for her cervical stenosis causing lower extremity weakness. She has a history of chronic diastolic heart failure with last HF exacerbation 04/2024.  From a cardiac standpoint she has been asymptomatic and doing well. She is able to perform ADLS though does them sitting 2/2 chronic back pain. Denied anginal symptoms. Form a heart failure stand point she is well-compensated at this time.   RCRI: Patient's perioperative risk of a major cardiac  event is 6.6%. No further cardiac work-up needed to optimize patient for surgery. MD to follow with final  recommendations.   Chronic Diastolic Heart Failure Elevated pulmonary pressures AKI-resolved Home diuretics held since 9/13 secondary to dehydration on admission. Cr now improved; 1.34-> 0.86 Net IO Since Admission: -13,580 mL [09/06/24 1225] Her CXR is unremarkable On exam she appears euvolemic though her body habitus does make volume status assessment challenging.  As patient is still net negative, okay to continue diuretics prior to surgery tomorrow. Would restart when patient can tolerate.   Continue metoprolol  succinate 25 mg   Hypertension BP: 138/58 Blood pressure has been overall elevated, though one BP today of 102/52.  PTA torsemide  and spironolactone  have been held since 9/13.  Medications as above.   Per primary  T2DM Cervical stenosis with compressive myelopathy UTI Chronic degenerative disc disease Chronic pain syndrome    Risk Assessment/Risk Scores:     [place]  Heart Association (NYHA) Functional Class NYHA Class II     For questions or updates, please contact [facility] Please consult [email or web address] for contact info under    Signed, Leontine LOISE Salen, PA-C  09/06/2024 11:26 AM  History and all data above reviewed.  I personally took the history today, performed the physical exam and formulated the assessment and plan.   The patient is well known to me.  She has a history of chronic diastolic dysfunction.  She previously had frequent hospitalizations but she has done well recently.  I saw her in the office this spring.  The patient denies any new symptoms such as chest discomfort, neck or arm discomfort. There has been no new shortness of breath, PND or orthopnea. There have been no reported palpitations, presyncope or syncope.  She gets around with a walker in her house.  She presented because of progresses and rather acute leg weakness.  She  has known cervical disc disease and she was being evaluated for possible surgery.  However, she called EMS when symptoms of bilateral leg weakness caused to have difficult ambulating.  The patient denies any new symptoms such as chest discomfort, neck or arm discomfort. There has been no new shortness of breath, PND or orthopnea. There have been no reported palpitations, presyncope or syncope.  I reviewed all relevant tests and studies. Patient examined.  I agree with the findings as above.  The patient exam reveals COR:RRR, systolic murmur 2/6 apical systolic  ,  Lungs: Clear  ,  Abd: Positive bowel sounds, no rebound no guarding, Ext No edema  .  All available labs, radiology testing, previous records reviewed. Agree with documented assessment and plan. Preop:  The patient has had no acute symptoms.  There are no acute physical findings.  The surgery planned is not high risk from a cardiovascular standpoint.  She was able to do her chores of daily living without symptoms prior to presentation.  The patient is at acceptable risk for the planned procedure without further cardiovascular testing. Chronic diastolic dysfunction:  She seems to be euvolemic.  Continue current therapy.  We will follow volume status closely during this admission.   Lynwood Shantanu Strauch  6:30 PM  09/06/2024

## 2024-09-06 NOTE — Progress Notes (Signed)
 Mobility Specialist - Progress Note   09/06/24 1115  Mobility  Activity Dangled on edge of bed  Level of Assistance +2 (takes two people) (Mod A)  Assistive Device Four wheel walker  Range of Motion/Exercises Active  Activity Response Tolerated poorly  Mobility Referral Yes  Mobility visit 1 Mobility  Mobility Specialist Start Time (ACUTE ONLY) 1015  Mobility Specialist Stop Time (ACUTE ONLY) 1033  Mobility Specialist Time Calculation (min) (ACUTE ONLY) 18 min   Received in bed and agreed to mobility. Min A of 1 to assist pt to EOB. Endorsed pain in BLE. Attempted 2 sit to stands, both Mod A of 2, both unsuccessful due to pain in BLE.  Returned to bed with all needs met, X-ray in room.  Cyndee Ada Mobility Specialist

## 2024-09-07 ENCOUNTER — Encounter (HOSPITAL_COMMUNITY): Payer: Self-pay | Admitting: Internal Medicine

## 2024-09-07 ENCOUNTER — Encounter (HOSPITAL_COMMUNITY)
Admission: EM | Disposition: A | Discharge status: Skilled Nursing Facility | Payer: Self-pay | Source: Home / Self Care | Attending: Internal Medicine

## 2024-09-07 ENCOUNTER — Inpatient Hospital Stay (HOSPITAL_COMMUNITY)

## 2024-09-07 ENCOUNTER — Other Ambulatory Visit: Payer: Self-pay

## 2024-09-07 ENCOUNTER — Inpatient Hospital Stay (HOSPITAL_COMMUNITY): Admitting: Anesthesiology

## 2024-09-07 DIAGNOSIS — Z87891 Personal history of nicotine dependence: Secondary | ICD-10-CM

## 2024-09-07 DIAGNOSIS — I5033 Acute on chronic diastolic (congestive) heart failure: Secondary | ICD-10-CM | POA: Diagnosis not present

## 2024-09-07 DIAGNOSIS — G959 Disease of spinal cord, unspecified: Secondary | ICD-10-CM | POA: Diagnosis not present

## 2024-09-07 DIAGNOSIS — M4802 Spinal stenosis, cervical region: Secondary | ICD-10-CM | POA: Diagnosis not present

## 2024-09-07 DIAGNOSIS — I110 Hypertensive heart disease with heart failure: Secondary | ICD-10-CM | POA: Diagnosis not present

## 2024-09-07 DIAGNOSIS — I5032 Chronic diastolic (congestive) heart failure: Secondary | ICD-10-CM | POA: Diagnosis not present

## 2024-09-07 HISTORY — PX: ANTERIOR CERVICAL DECOMP/DISCECTOMY FUSION: SHX1161

## 2024-09-07 LAB — GLUCOSE, CAPILLARY
Glucose-Capillary: 117 mg/dL — ABNORMAL HIGH (ref 70–99)
Glucose-Capillary: 129 mg/dL — ABNORMAL HIGH (ref 70–99)
Glucose-Capillary: 220 mg/dL — ABNORMAL HIGH (ref 70–99)
Glucose-Capillary: 67 mg/dL — ABNORMAL LOW (ref 70–99)
Glucose-Capillary: 68 mg/dL — ABNORMAL LOW (ref 70–99)
Glucose-Capillary: 75 mg/dL (ref 70–99)
Glucose-Capillary: 78 mg/dL (ref 70–99)
Glucose-Capillary: 98 mg/dL (ref 70–99)

## 2024-09-07 LAB — SURGICAL PCR SCREEN
MRSA, PCR: NEGATIVE
Staphylococcus aureus: NEGATIVE

## 2024-09-07 SURGERY — ANTERIOR CERVICAL DECOMPRESSION/DISCECTOMY FUSION 1 LEVEL/HARDWARE REMOVAL
Anesthesia: General | Site: Spine Cervical

## 2024-09-07 MED ORDER — THROMBIN 5000 UNITS EX KIT
PACK | CUTANEOUS | Status: AC
  Filled 2024-09-07: qty 1

## 2024-09-07 MED ORDER — SODIUM CHLORIDE 0.9% FLUSH
3.0000 mL | Freq: Two times a day (BID) | INTRAVENOUS | Status: DC
  Administered 2024-09-07 – 2024-09-13 (×12): 3 mL via INTRAVENOUS

## 2024-09-07 MED ORDER — DEXTROSE 50 % IV SOLN
INTRAVENOUS | Status: AC
  Filled 2024-09-07: qty 50

## 2024-09-07 MED ORDER — ROCURONIUM BROMIDE 10 MG/ML (PF) SYRINGE
PREFILLED_SYRINGE | INTRAVENOUS | Status: AC
  Filled 2024-09-07: qty 40

## 2024-09-07 MED ORDER — SODIUM CHLORIDE 0.9 % IV SOLN
250.0000 mL | INTRAVENOUS | Status: AC
  Administered 2024-09-08: 250 mL via INTRAVENOUS

## 2024-09-07 MED ORDER — CEFAZOLIN SODIUM 1 G IJ SOLR
INTRAMUSCULAR | Status: AC
  Filled 2024-09-07: qty 20

## 2024-09-07 MED ORDER — DEXMEDETOMIDINE HCL IN NACL 80 MCG/20ML IV SOLN
INTRAVENOUS | Status: DC | PRN
  Administered 2024-09-07: 8 ug via INTRAVENOUS
  Administered 2024-09-07: 12 ug via INTRAVENOUS

## 2024-09-07 MED ORDER — FENTANYL CITRATE (PF) 250 MCG/5ML IJ SOLN
INTRAMUSCULAR | Status: DC | PRN
  Administered 2024-09-07: 50 ug via INTRAVENOUS
  Administered 2024-09-07: 100 ug via INTRAVENOUS
  Administered 2024-09-07 (×2): 50 ug via INTRAVENOUS

## 2024-09-07 MED ORDER — MENTHOL 3 MG MT LOZG: 1.0000 | LOZENGE | OROMUCOSAL | Status: DC | PRN

## 2024-09-07 MED ORDER — PROPOFOL 10 MG/ML IV BOLUS
INTRAVENOUS | Status: AC
  Filled 2024-09-07: qty 20

## 2024-09-07 MED ORDER — PHENOL 1.4 % MT LIQD: 1.0000 | OROMUCOSAL | Status: DC | PRN

## 2024-09-07 MED ORDER — SUCCINYLCHOLINE CHLORIDE 200 MG/10ML IV SOSY
PREFILLED_SYRINGE | INTRAVENOUS | Status: AC
  Filled 2024-09-07: qty 10

## 2024-09-07 MED ORDER — DROPERIDOL 2.5 MG/ML IJ SOLN: 0.6250 mg | Freq: Once | INTRAMUSCULAR | Status: DC | PRN

## 2024-09-07 MED ORDER — THROMBIN 5000 UNITS EX SOLR
OROMUCOSAL | Status: DC | PRN
  Administered 2024-09-07: 5 mL via TOPICAL

## 2024-09-07 MED ORDER — HYDROMORPHONE HCL 1 MG/ML IJ SOLN: 0.2500 mg | INTRAMUSCULAR | Status: DC | PRN

## 2024-09-07 MED ORDER — VANCOMYCIN HCL 1500 MG/300ML IV SOLN
1500.0000 mg | INTRAVENOUS | Status: AC
Start: 2024-09-07 — End: 2024-09-07
  Administered 2024-09-07: 1500 mg via INTRAVENOUS

## 2024-09-07 MED ORDER — SODIUM CHLORIDE 0.9% FLUSH: 3.0000 mL | INTRAVENOUS | Status: DC | PRN

## 2024-09-07 MED ORDER — EPHEDRINE 5 MG/ML INJ
INTRAVENOUS | Status: AC
  Filled 2024-09-07: qty 5

## 2024-09-07 MED ORDER — THROMBIN 20000 UNITS EX SOLR
CUTANEOUS | Status: AC
  Filled 2024-09-07: qty 20000

## 2024-09-07 MED ORDER — LACTATED RINGERS IV SOLN: INTRAVENOUS | Status: DC | PRN

## 2024-09-07 MED ORDER — DEXTROSE 50 % IV SOLN
12.5000 g | Freq: Once | INTRAVENOUS | Status: AC
  Administered 2024-09-07: 12.5 g via INTRAVENOUS
  Filled 2024-09-07: qty 50

## 2024-09-07 MED ORDER — DEXAMETHASONE SODIUM PHOSPHATE 10 MG/ML IJ SOLN
INTRAMUSCULAR | Status: DC | PRN
  Administered 2024-09-07: 10 mg via INTRAVENOUS

## 2024-09-07 MED ORDER — OXYCODONE HCL 5 MG PO TABS
2.5000 mg | ORAL_TABLET | Freq: Three times a day (TID) | ORAL | Status: DC
  Administered 2024-09-07 – 2024-09-11 (×10): 2.5 mg via ORAL
  Filled 2024-09-07 (×10): qty 1

## 2024-09-07 MED ORDER — ROCURONIUM BROMIDE 10 MG/ML (PF) SYRINGE
PREFILLED_SYRINGE | INTRAVENOUS | Status: DC | PRN
  Administered 2024-09-07: 30 mg via INTRAVENOUS
  Administered 2024-09-07: 80 mg via INTRAVENOUS
  Administered 2024-09-07 (×2): 20 mg via INTRAVENOUS

## 2024-09-07 MED ORDER — THROMBIN 20000 UNITS EX SOLR
CUTANEOUS | Status: DC | PRN
  Administered 2024-09-07: 20 mL via TOPICAL

## 2024-09-07 MED ORDER — SUGAMMADEX SODIUM 200 MG/2ML IV SOLN
INTRAVENOUS | Status: DC | PRN
  Administered 2024-09-07: 280 mg via INTRAVENOUS

## 2024-09-07 MED ORDER — VANCOMYCIN HCL IN DEXTROSE 1-5 GM/200ML-% IV SOLN
1000.0000 mg | Freq: Once | INTRAVENOUS | Status: AC
  Administered 2024-09-08: 1000 mg via INTRAVENOUS
  Filled 2024-09-07: qty 200

## 2024-09-07 MED ORDER — MIDAZOLAM HCL 2 MG/2ML IJ SOLN
INTRAMUSCULAR | Status: AC
  Filled 2024-09-07: qty 2

## 2024-09-07 MED ORDER — OXYCODONE-ACETAMINOPHEN 5-325 MG PO TABS
1.0000 | ORAL_TABLET | Freq: Three times a day (TID) | ORAL | Status: DC
  Administered 2024-09-07 – 2024-09-11 (×11): 1 via ORAL
  Filled 2024-09-07 (×11): qty 1

## 2024-09-07 MED ORDER — PHENYLEPHRINE 80 MCG/ML (10ML) SYRINGE FOR IV PUSH (FOR BLOOD PRESSURE SUPPORT)
PREFILLED_SYRINGE | INTRAVENOUS | Status: AC
  Filled 2024-09-07: qty 10

## 2024-09-07 MED ORDER — ACETAMINOPHEN 500 MG PO TABS
1000.0000 mg | ORAL_TABLET | Freq: Once | ORAL | Status: AC
  Administered 2024-09-07: 1000 mg via ORAL
  Filled 2024-09-07: qty 2

## 2024-09-07 MED ORDER — LIDOCAINE 2% (20 MG/ML) 5 ML SYRINGE
INTRAMUSCULAR | Status: AC
  Filled 2024-09-07: qty 10

## 2024-09-07 MED ORDER — FENTANYL CITRATE (PF) 250 MCG/5ML IJ SOLN
INTRAMUSCULAR | Status: AC
  Filled 2024-09-07: qty 5

## 2024-09-07 MED ORDER — DEXMEDETOMIDINE HCL IN NACL 80 MCG/20ML IV SOLN
INTRAVENOUS | Status: AC
  Filled 2024-09-07: qty 20

## 2024-09-07 MED ORDER — HYDROMORPHONE HCL 1 MG/ML IJ SOLN
1.0000 mg | INTRAMUSCULAR | Status: DC | PRN
  Administered 2024-09-08 – 2024-09-12 (×10): 1 mg via INTRAVENOUS
  Filled 2024-09-07 (×10): qty 1

## 2024-09-07 MED ORDER — ONDANSETRON HCL 4 MG/2ML IJ SOLN
INTRAMUSCULAR | Status: DC | PRN
  Administered 2024-09-07: 4 mg via INTRAVENOUS

## 2024-09-07 MED ORDER — DEXTROSE 50 % IV SOLN
12.5000 g | INTRAVENOUS | Status: AC
  Administered 2024-09-07: 12.5 g via INTRAVENOUS
  Filled 2024-09-07: qty 50

## 2024-09-07 MED ORDER — LIDOCAINE 2% (20 MG/ML) 5 ML SYRINGE
INTRAMUSCULAR | Status: DC | PRN
Start: 2024-09-07 — End: 2024-09-07
  Administered 2024-09-07: 100 mg via INTRAVENOUS

## 2024-09-07 MED ORDER — PROPOFOL 10 MG/ML IV BOLUS
INTRAVENOUS | Status: DC | PRN
  Administered 2024-09-07: 200 mg via INTRAVENOUS

## 2024-09-07 MED ORDER — EPHEDRINE SULFATE-NACL 50-0.9 MG/10ML-% IV SOSY
PREFILLED_SYRINGE | INTRAVENOUS | Status: DC | PRN
Start: 2024-09-07 — End: 2024-09-07
  Administered 2024-09-07: 10 mg via INTRAVENOUS

## 2024-09-07 MED ORDER — 0.9 % SODIUM CHLORIDE (POUR BTL) OPTIME
TOPICAL | Status: DC | PRN
  Administered 2024-09-07: 1000 mL

## 2024-09-07 MED ORDER — DEXAMETHASONE SODIUM PHOSPHATE 10 MG/ML IJ SOLN
INTRAMUSCULAR | Status: AC
Start: 2024-09-07 — End: 2024-09-07
  Filled 2024-09-07: qty 2

## 2024-09-07 MED ORDER — ARTIFICIAL TEARS OPHTHALMIC OINT
TOPICAL_OINTMENT | OPHTHALMIC | Status: AC
  Filled 2024-09-07: qty 3.5

## 2024-09-07 MED ORDER — MIDAZOLAM HCL 2 MG/2ML IJ SOLN
INTRAMUSCULAR | Status: DC | PRN
Start: 2024-09-07 — End: 2024-09-07
  Administered 2024-09-07: 2 mg via INTRAVENOUS

## 2024-09-07 MED ORDER — ONDANSETRON HCL 4 MG/2ML IJ SOLN
INTRAMUSCULAR | Status: AC
Start: 2024-09-07 — End: 2024-09-07
  Filled 2024-09-07: qty 4

## 2024-09-07 SURGICAL SUPPLY — 43 items
BAG COUNTER SPONGE SURGICOUNT (BAG) ×1 IMPLANT
BAND RUBBER #18 3X1/16 STRL (MISCELLANEOUS) ×2 IMPLANT
BENZOIN TINCTURE PRP APPL 2/3 (GAUZE/BANDAGES/DRESSINGS) ×1 IMPLANT
BIT DRILL 13 (BIT) IMPLANT
BUR MATCHSTICK NEURO 3.0 LAGG (BURR) ×1 IMPLANT
CANISTER SUCTION 3000ML PPV (SUCTIONS) ×1 IMPLANT
DRAPE C-ARM 42X72 X-RAY (DRAPES) ×2 IMPLANT
DRAPE LAPAROTOMY 100X72 PEDS (DRAPES) ×1 IMPLANT
DRAPE MICROSCOPE SLANT 54X150 (MISCELLANEOUS) ×1 IMPLANT
DURAPREP 6ML APPLICATOR 50/CS (WOUND CARE) ×1 IMPLANT
ELECT COATED BLADE 2.86 ST (ELECTRODE) ×1 IMPLANT
ELECTRODE REM PT RTRN 9FT ADLT (ELECTROSURGICAL) ×1 IMPLANT
GAUZE 4X4 16PLY ~~LOC~~+RFID DBL (SPONGE) IMPLANT
GAUZE SPONGE 4X4 12PLY STRL (GAUZE/BANDAGES/DRESSINGS) ×1 IMPLANT
GLOVE ECLIPSE 9.0 STRL (GLOVE) ×1 IMPLANT
GLOVE EXAM NITRILE XL STR (GLOVE) IMPLANT
GOWN STRL REUS W/ TWL LRG LVL3 (GOWN DISPOSABLE) IMPLANT
GOWN STRL REUS W/ TWL XL LVL3 (GOWN DISPOSABLE) IMPLANT
GOWN STRL REUS W/TWL 2XL LVL3 (GOWN DISPOSABLE) IMPLANT
HALTER HD/CHIN CERV TRACTION D (MISCELLANEOUS) ×1 IMPLANT
HEMOSTAT POWDER KIT SURGIFOAM (HEMOSTASIS) IMPLANT
KIT BASIN OR (CUSTOM PROCEDURE TRAY) ×1 IMPLANT
KIT TURNOVER KIT B (KITS) ×1 IMPLANT
NDL SPNL 20GX3.5 QUINCKE YW (NEEDLE) ×1 IMPLANT
NEEDLE SPNL 20GX3.5 QUINCKE YW (NEEDLE) ×1 IMPLANT
NS IRRIG 1000ML POUR BTL (IV SOLUTION) ×1 IMPLANT
PACK LAMINECTOMY NEURO (CUSTOM PROCEDURE TRAY) ×1 IMPLANT
PAD ARMBOARD POSITIONER FOAM (MISCELLANEOUS) ×3 IMPLANT
PLATE VISION ELITE 27.5MM (Plate) IMPLANT
SCREW ST 13X4XST VA NS SPNE (Screw) IMPLANT
SPACER SPNL 11X14X7XPEEK CVD (Cage) IMPLANT
SPONGE INTESTINAL PEANUT (DISPOSABLE) ×1 IMPLANT
SPONGE SURGIFOAM ABS GEL 100 (HEMOSTASIS) IMPLANT
SPONGE SURGIFOAM ABS GEL SZ50 (HEMOSTASIS) ×1 IMPLANT
STRIP CLOSURE SKIN 1/2X4 (GAUZE/BANDAGES/DRESSINGS) ×1 IMPLANT
SUT VIC AB 3-0 SH 8-18 (SUTURE) ×1 IMPLANT
SUT VIC AB 4-0 RB1 18 (SUTURE) ×1 IMPLANT
TAPE CLOTH 4X10 WHT NS (GAUZE/BANDAGES/DRESSINGS) ×1 IMPLANT
TAPE CLOTH SURG 4X10 WHT LF (GAUZE/BANDAGES/DRESSINGS) IMPLANT
TOWEL GREEN STERILE (TOWEL DISPOSABLE) ×1 IMPLANT
TOWEL GREEN STERILE FF (TOWEL DISPOSABLE) ×1 IMPLANT
TRAP SPECIMEN MUCUS 40CC (MISCELLANEOUS) ×1 IMPLANT
WATER STERILE IRR 1000ML POUR (IV SOLUTION) ×1 IMPLANT

## 2024-09-07 NOTE — Progress Notes (Signed)
 Orthopedic Tech Progress Note Patient Details:  Maria [name] 1956-12-07 [id number]  Ortho Devices Type of Ortho Device: Soft collar Ortho Device/Splint Location: neck Ortho Device/Splint Interventions: Ordered, Application, Adjustment   Post Interventions Patient Tolerated: Fair Instructions Provided: Care of device, Adjustment of device  [name] Ronal Brasil 09/07/2024, 7:45 PM

## 2024-09-07 NOTE — Interval H&P Note (Signed)
 History and Physical Interval Note:  09/07/2024 4:28 PM  Maria [name]  has presented today for surgery, with the diagnosis of Stenosis cervical spine.  The various methods of treatment have been discussed with the patient and family. After consideration of risks, benefits and other options for treatment, the patient has consented to  Procedure(s): ANTERIOR CERVICAL DECOMPRESSION/DISCECTOMY FUSION CERVICAL FOUR-FIVE /HARDWARE REMOVAL CERVICAL FIVE-SEVEN PLATE (N/A) as a surgical intervention.  The patient's history has been reviewed, patient examined, no change in status, stable for surgery.  I have reviewed the patient's chart and labs.  Questions were answered to the patient's satisfaction.     [employer]ictory LABOR Andrena Margerum

## 2024-09-07 NOTE — Brief Op Note (Signed)
 09/01/2024 - 09/07/2024  6:53 PM  PATIENT:  Maria [name]  68 y.o. female  PRE-OPERATIVE DIAGNOSIS:  Stenosis cervical spine  POST-OPERATIVE DIAGNOSIS:  Stenosis cervical spine  PROCEDURE:  Procedure(s): ANTERIOR CERVICAL DECOMPRESSION/DISCECTOMY FUSION CERVICAL FOUR-FIVE LONEY REMOVAL CERVICAL FIVE-SEVEN PLATE (N/A)  SURGEON:  Surgeons and Role:    DEWAINE Louis Shove, MD - Primary  PHYSICIAN ASSISTANT:   ASSISTANTSBETHA Jennetta PIETY   ANESTHESIA:   general  EBL:  100 mL   BLOOD ADMINISTERED:none  DRAINS: (med) Hemovact drain(s) in the prevertebral space with  Suction Open   LOCAL MEDICATIONS USED:  NONE  SPECIMEN:  No Specimen  DISPOSITION OF SPECIMEN:  N/A  COUNTS:  YES  TOURNIQUET:  * No tourniquets in log *  DICTATION: .Dragon Dictation  PLAN OF CARE: Admit to inpatient   PATIENT DISPOSITION:  PACU - hemodynamically stable.   Delay start of Pharmacological VTE agent (>24hrs) due to surgical blood loss or risk of bleeding: yes

## 2024-09-07 NOTE — Progress Notes (Addendum)
 Progress Note  Patient Name: Maria [name] Date of Encounter: 09/07/2024 [facility] Cardiologist: Lynwood Schilling, MD   Interval Summary   Overall doing well little anxious about her surgery later this evening. Denied lightheadedness/dizziness, chest pain, palpitations, shortness of breath, abdominal distention, and peripheral edema   Vital Signs Vitals:   09/06/24 1253 09/06/24 1945 09/06/24 2159 09/07/24 0543  BP:  (!) 122/50 (!) 123/55 (!) 144/64  Pulse:  66 64 66  Resp:  18  18  Temp:  97.7 F (36.5 C)  (!) 97.5 F (36.4 C)  TempSrc:    Oral  SpO2:  99%  98%  Weight: (!) 139.7 kg     Height:        Intake/Output Summary (Last 24 hours) at 09/07/2024 0952 Last data filed at 09/07/2024 0730 Gross per 24 hour  Intake 720 ml  Output 2725 ml  Net -2005 ml      09/06/2024   12:53 PM 09/03/2024    9:21 AM 07/07/2024    9:47 AM  Last 3 Weights  Weight (lbs) 307 lb 14.4 oz 309 lb 1.4 oz 309 lb  Weight (kg) 139.663 kg 140.2 kg 140.161 kg      Physical Exam  GEN: No acute distress.   Neck: No JVD Cardiac: RRR, no murmurs, rubs, or gallops.  Respiratory: Clear to auscultation bilaterally. GI: Soft, nontender, non-distended  MS: No edema  Assessment & Plan  Maria [name] is a 68 y.o. female with a hx of chronic diastolic heart failure, hypertension, hyperlipidemia, GERD, anxiety/depression, and T2DM who had presented to the ED for progressive weakness. 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 Neurosurgery was consulted, patient to undergo anterior cervical decompression and fusion to C4-C5. Cardiology was consulted for pre-operative evaluation, no further work-up was recommended as patient was optimized. We continue to follow for fluid management this admission as she developed an AKI and was removed from her diuretics.   Chronic Diastolic Heart Failure Elevated pulmonary pressures AKI-resolved [1.34-> 0.86] Home diuretics held since  9/13 secondary to dehydration on admission. Her CXR was unremarkable Net IO Since Admission: -15,585 mL [09/07/24 0957] On exam she appears euvolemic though her body habitus does make volume status assessment challenging.  Continue metoprolol  succinate 25 mg  Discontinued K repletion as patient has not been receiving torsemide , and K has been steadily increasing admission.    Hypertension BP: 144/64 PTA torsemide  and spironolactone  have been held since 9/13.  While her blood pressure has been elevated at times this admission, would hold off restarting until after the surgery today Metoprolol  as above   Per primary  T2DM Cervical stenosis with compressive myelopathy UTI Chronic degenerative disc disease Chronic pain syndrome  For questions or updates, please contact [facility] Please consult [email or web address] for contact info under       Signed, Leontine LOISE Salen, PA-C   History and all data above reviewed.  I personally took the history today, performed the physical exam and formulated the substantive portion of the  assessment and plan.  I reviewed all relevant tests and studies. Patient examined.  I agree with the findings as above.  She denies any new shortness of breath.  She has not had palpitations.  She has had no chest pain.  The patient exam reveals COR: Regular rate and rhythm, systolic murmur unchanged,  Lungs: Clear to auscultation bilaterally,  Abd: Positive bowel sounds, no rebound no guarding, Ext No edema  .  All  available labs, radiology testing, previous records reviewed. Agree with documented assessment and plan.  Diastolic heart failure: The patient has chronic diastolic heart failure but is euvolemic and in fact has put up quite a bit of urine.   Diuretics are being held.  We will follow volume closely.   Hypertension continue metoprolol .  Lynwood Amdrew Oboyle  12:30 PM  09/07/2024

## 2024-09-07 NOTE — Anesthesia Procedure Notes (Signed)
 Procedure Name: Intubation Date/Time: 09/07/2024 4:49 PM  Performed by: Lockie Flesher, CRNAPre-anesthesia Checklist: Patient identified, Emergency Drugs available, Suction available and Patient being monitored Patient Re-evaluated:Patient Re-evaluated prior to induction Oxygen Delivery Method: Circle System Utilized Preoxygenation: Pre-oxygenation with 100% oxygen Induction Type: IV induction Ventilation: Mask ventilation without difficulty Laryngoscope Size: Glidescope and 3 Grade View: Grade I Tube type: Oral Number of attempts: 1 Airway Equipment and Method: Stylet and Oral airway Placement Confirmation: ETT inserted through vocal cords under direct vision, positive ETCO2 and breath sounds checked- equal and bilateral Secured at: 23 cm Tube secured with: Tape Dental Injury: Teeth and Oropharynx as per pre-operative assessment

## 2024-09-07 NOTE — Progress Notes (Addendum)
 PROGRESS NOTE   Maria [name]  FMW:[id number]    DOB: January 25, 1956    DOA: 09/01/2024  PCP: Ileen [name], NP   I have briefly reviewed patients previous medical records in [facility].   Brief Hospital Course:  68 y.o. 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  Diagnosed with cervical compression myelopathy.  Neuro surgery has evaluated and recommend transferring from [facility] to [facility] for anterior cervical decompression and fusion at C4-5 on 9/18 afternoon.  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  She is able to move her lower extremities but significant weakness noted.  Unable to elicit reflexes due to body habitus. Presentation is likely multifactorial considering her morbid obesity, possible UTI, deconditioning.  However her at this time it is felt that primary reason for her weakness is the compressive cervical myelopathy. Patient with chronic lower back pain but worse over the last few weeks.  Denies any falls or injuries. Previous imaging studies reviewed.  She had a MRI of the cervical spine back in March which showed severe spinal stenosis and moderate to severe bilateral neuroforaminal stenosis at C4-C5.  Also noted in C3-C4 region. She was noted to have undergone MRI of the lumbar spine in 2024 which showed degenerative changes.  Moderate to severe spinal and lateral recess stenosis at L3-L4 and L4-L5 noted.  Disease also noted in L2-L3.  She underwent thoracic spine MRI at the same time which showed mild degenerative changes without any  high-grade spinal stenosis. Underwent MRI of the cervical, thoracic and lumbar spine during this hospitalization.  Stenosis and degenerative changes noted at multiple levels in the cervical thoracic and lumbar region.   Neurosurgery was consulted.  They do feel that her cervical disease is significant enough to be causing symptoms.  They plan surgical intervention and initially wanted to do it on an elective basis if possible. Patient was seen by PT and OT.  Her ambulatory status is still very poor.  She is completely off her baseline.  They have recommended short-term rehab at skilled nursing facility.  May need to undergo surgery prior to discharge. Neurosurgery follow-up appreciated and given her severity of signs and symptoms, tentatively plan for anterior cervical decompression and fusion at C4-5 at [facility] on 9/1 afternoon.  Transfer order was placed 9/16 but patient is still waiting on a bed for transfer as of time of this note.  Discussed with patient's RN. As per update from [facility], patient will be directly going from [facility] to the OR at [facility], followed by PACU while waiting for bed placement. Cardiology input appreciated and no further workup recommended prior to surgery.   Concern for UTI Previous history of Klebsiella UTI in 2024.  Came in with frequent urination. Empirically on ceftriaxone .  Urine culture from 9/13 showed multiple species, likely contaminant. No fever or leukocytosis since hospital admission.  Has completed 6 days of IV ceftriaxone  and has more than completed course for UTI.  Discontinued ceftriaxone .   Chronic diastolic CHF Torsemide  and spironolactone  on hold since 9/13.  Difficult to accurately determine volume status due to  her body habitus/obesity but overall appears clinically euvolemic.  Continue to hold off on diuretics pending upcoming surgery. TTE 02/09/2023 showed LVEF of 65-70%, moderate concentric LVH, grade 2 diastolic dysfunction, small pericardial  effusion, large left pleural effusion.  Chest x-ray 9/17 personally reviewed: No acute findings.  Essential hypertension: Blood pressure is well-controlled.  Continue Toprol -XL 25 mg daily.  As stated above, diuretics currently on hold   Chronic pain syndrome On oxycodone  at home which is being continued.  Oxycodone  dose was adjusted.  Still with a lot of pain and discomfort.  She was started on scheduled Percocet 9/16 and then make further adjustments as needed.   Bowel regimen. Monitor closely.  Postop, may be able to wean her off the scheduled Percocet.   Diabetes mellitus type 2-hypoglycemia Monitor CBGs.  SSI.  HbA1c 5.0.   She is on Mounjaro primarily for weight loss purposes.  She has lost about 60 pounds in the last few months. Had hypoglycemic episode around midday today, 67 mg/dL.  Has not received almost any SSI over the last couple days.  Resuscitated with IV D50, half ampule.  Monitor CBGs closely especially while n.p.o. and perioperatively.  Discontinued SSI.   Bilateral knee pain X-rays showed significant arthritis without any acute findings.  Outpatient follow-up with orthopedics  AKI Creatinine had peaked to 1.34 on 9/13.  Resolved.    Body mass index is 54.54 kg/m./Very morbid obesity Complicates care.   DVT prophylaxis: SCD's Start: 09/06/24 1805     Code Status: Full Code:  Family Communication: None at bedside. Disposition:  Status is: Inpatient Remains inpatient appropriate because: Awaiting transfer from [facility] to [facility] for back surgery on 9/18 scheduled for 4 PM.     Consultants:   Neurosurgery/Dr. Victory Gunnels Cardiology  Procedures:     Subjective:  No new complaints since yesterday.  Ongoing left side of neck and right lower back pain, with some temporary relief with pain medications.  She feels that postsurgery, she will do better as far as pain is concerned.  Objective:   Vitals:   09/06/24 1253 09/06/24 1945  09/06/24 2159 09/07/24 0543  BP:  (!) 122/50 (!) 123/55 (!) 144/64  Pulse:  66 64 66  Resp:  18  18  Temp:  97.7 F (36.5 C)  (!) 97.5 F (36.4 C)  TempSrc:    Oral  SpO2:  99%  98%  Weight: (!) 139.7 kg     Height:        General exam: Middle-age female, moderately built and morbidly obese lying comfortably propped up in bed without distress. Respiratory system: Clear to auscultation. Respiratory effort normal.  Stable. Cardiovascular system: S1 & S2 heard, RRR. No JVD, murmurs, rubs, gallops or clicks.  Trace bilateral ankle edema.  Stable. Gastrointestinal system: Abdomen is nondistended, soft and nontender. No organomegaly or masses felt. Normal bowel sounds heard. Central nervous system: Alert and oriented. No focal neurological deficits. Extremities: At least grade 3 x 5 power in lower extremities.  Evaluation restricted due to low back pain. Skin: No rashes, lesions or ulcers Psychiatry: Judgement and insight appear normal. Mood & affect appropriate.     Data Reviewed:   I have personally reviewed following labs and imaging studies   CBC: Recent Labs  Lab 09/01/24 0659 09/02/24 0506 09/03/24 0618 09/06/24 0425  WBC 8.3 6.1 6.2 6.8  NEUTROABS 4.7  --   --   --   HGB 11.0* 11.4* 11.8* 12.3  HCT  35.1* 36.8 36.9 38.8  MCV 94.4 92.7 93.7 93.7  PLT 241 249 234 242    Basic Metabolic Panel: Recent Labs  Lab 09/01/24 0659 09/02/24 0506 09/03/24 0406 09/04/24 0349 09/06/24 0425  NA 139 141 138 138 139  K 3.7 3.8 3.9 4.2 4.5  CL 101 98 99 100 103  CO2 27 29 29 27 26   GLUCOSE 94 94 95 105* 106*  BUN 11 11 12 12 19   CREATININE 1.08* 1.34* 1.10* 0.90 0.86  CALCIUM  9.1 8.9 9.0 9.6 9.3  MG  --   --   --   --  2.3    Liver Function Tests: Recent Labs  Lab 09/03/24 0406  AST 13*  ALT 7  ALKPHOS 69  BILITOT 0.9  PROT 6.0*  ALBUMIN  3.5    CBG: Recent Labs  Lab 09/06/24 1634 09/06/24 2043 09/07/24 0726  GLUCAP 94 103* 75    Microbiology Studies:    Recent Results (from the past 240 hours)  Urine Culture     Status: Abnormal   Collection Time: 09/01/24  7:38 AM   Specimen: Urine, Clean Catch  Result Value Ref Range Status   Specimen Description   Final    URINE, CLEAN CATCH Performed at [facility], [address], [place], [place] [address]    Special Requests   Final    NONE Performed at [facility], [address], [place], [place] [address]    Culture MULTIPLE SPECIES PRESENT, SUGGEST RECOLLECTION (A)  Final   Report Status 09/02/2024 FINAL  Final  Surgical pcr screen     Status: None   Collection Time: 09/07/24  1:30 AM  Result Value Ref Range Status   MRSA, PCR NEGATIVE NEGATIVE Final   Staphylococcus aureus NEGATIVE NEGATIVE Final    Comment: (NOTE) The Xpert SA Assay (FDA approved for NASAL specimens in patients 51 years of age and older), is one component of a comprehensive surveillance program. It is not intended to diagnose infection nor to guide or monitor treatment. Performed at [facility], [address], [place], [place] [address]     Radiology Studies:  DG CHEST PORT 1 VIEW Result Date: 09/06/2024 CLINICAL DATA:  Preop for cervical spine surgery. EXAM: PORTABLE CHEST 1 VIEW COMPARISON:  Apr 26, 2024. FINDINGS: Stable cardiomegaly. Both lungs are clear. The visualized skeletal structures are unremarkable. IMPRESSION: No active disease. Electronically Signed   By: Lynwood Landy Raddle M.D.   On: 09/06/2024 10:59    Scheduled Meds:    cholecalciferol   1,000 Units Oral Daily   clonazePAM   1 mg Oral QHS   cyanocobalamin   1,000 mcg Oral Daily   famotidine   40 mg Oral QHS   fluticasone   1 spray Each Nare Daily   insulin  aspart  0-15 Units Subcutaneous TID WC   insulin  aspart  0-5 Units Subcutaneous QHS   metoprolol  succinate  25 mg Oral Daily   nystatin   1 Application Topical Daily   oxyCODONE -acetaminophen   1 tablet Oral TID   pantoprazole   40 mg  Oral Daily   polyethylene glycol  17 g Oral Daily   pramipexole   0.5 mg Oral TID   senna-docusate  2 tablet Oral BID    Continuous Infusions:       LOS: 6 days     Trenda Mar, MD,  FACP, [facility], [facility], [facility]   [facility] Phys[facility] Advisor [employer]      To contact the attending provider  between 7A-7P or the covering provider during after hours 7P-7A, please log into the web site [email or web address] and access using universal [place] password for that web site. If you do not have the password, please call the hospital operator.  09/07/2024, 12:04 PM

## 2024-09-07 NOTE — Progress Notes (Signed)
   09/07/24 1045  Spiritual Encounters  Type of Visit Initial  Care provided to: Patient  Referral source Chaplain team  Reason for visit Advance directives  OnCall Visit No   Chaplin responded to a request to follow up with the patient in reference to her advanced directive paperwork. The patient, Maria [name], expressed her sorrow because the paperwork was not complete. I assured her I was just following up to see if she had any questions and explained how she could complete the paperwork at home.   Maria [name] shared that she was looking forward to a surgery she expects to have today. Namely the relief and additional freedom of movement that she will have.  Maria [name] reflected back on her life and how far she had come. 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 Her hope is that she can make a difference in the life of a young woman starting out.   I provided  support through a safe space for Maria [name] to reflect and share her story. Also a listening and non-anxious presence as she shared hopes and dreams for her future while reflecting on her past. .   Carley Birmingham Chaplain  [facility]  [phone]

## 2024-09-07 NOTE — Transfer of Care (Signed)
 Immediate Anesthesia Transfer of Care Note  Patient: Maria [name]  Procedure(s) Performed: ANTERIOR CERVICAL DECOMPRESSION/DISCECTOMY FUSION CERVICAL FOUR-FIVE LONEY REMOVAL CERVICAL FIVE-SEVEN PLATE (Spine Cervical)  Patient Location: PACU  Anesthesia Type:General  Level of Consciousness: awake and alert   Airway & Oxygen Therapy: Patient Spontanous Breathing and Patient connected to face mask oxygen  Post-op Assessment: Report given to RN, Post -op Vital signs reviewed and stable, and Patient moving all extremities X 4  Post vital signs: Reviewed and stable  Last Vitals:  Vitals Value Taken Time  BP 159/57 09/07/24 19:15  Temp    Pulse 76 09/07/24 19:23  Resp 14 09/07/24 19:23  SpO2 96 % 09/07/24 19:23  Vitals shown include unfiled device data.  Last Pain:  Vitals:   09/07/24 1519  TempSrc: Oral  PainSc: 6          Complications: No notable events documented.

## 2024-09-07 NOTE — TOC Progression Note (Signed)
 Transition of Care [facility]) - Progression Note    Patient Details  Name: Maria [name] MRN: [id number] Date of Birth: [birth year]  [facility] CM/SW Contact  Doneta Glenys DASEN, RN Phone Number: 09/07/2024, 11:46 AM  Clinical Narrative:    Patient being transferred [facility] for anterior cervical decompression and fusion at C4-5 this afternoon. PASRR and FL2 completed. [employer] aware that patient will be transferring. If patient is going to SNF, will need insurance auth.  Expected Discharge Plan: Skilled Nursing Facility Barriers to Discharge: Continued Medical Work up, SNF Pending bed offer               Expected Discharge Plan and Services In-house Referral: NA Discharge Planning Services: CM Consult   Living arrangements for the past 2 months: Apartment                 DME Arranged: N/A DME Agency: NA       HH Arranged: NA HH Agency: NA         Social Drivers of Health (SDOH) Interventions SDOH Screenings   Food Insecurity: No Food Insecurity (09/01/2024)  Housing: Low Risk  (09/01/2024)  Transportation Needs: No Transportation Needs (09/01/2024)  Utilities: Not At Risk (09/01/2024)  Alcohol  Screen: Low Risk  (12/30/2021)  Depression (PHQ2-9): Medium Risk (01/30/2022)  Financial Resource Strain: Medium Risk (12/30/2021)  Physical Activity: Inactive (12/30/2021)  Social Connections: Socially Isolated (09/01/2024)  Stress: No Stress Concern Present (12/30/2021)  Tobacco Use: Medium Risk (09/01/2024)    Readmission Risk Interventions    09/04/2024    4:45 PM 05/03/2024   10:25 AM 04/30/2024    4:03 PM  Readmission Risk Prevention Plan  Transportation Screening Complete Complete Complete  Medication Review Oceanographer) Complete Complete Complete  PCP or Specialist appointment within 3-5 days of discharge Complete Complete Complete  HRI or Home Care Consult Complete Complete Complete  SW Recovery Care/Counseling Consult Complete Complete  Complete  Palliative Care Screening Not Applicable Not Applicable Not Applicable  Skilled Nursing Facility Complete Not Applicable

## 2024-09-07 NOTE — Plan of Care (Signed)

## 2024-09-07 NOTE — Progress Notes (Signed)
 PT Cancellation Note  Patient Details Name: Maria [name] MRN: [id number] DOB: Apr 16, 1956   Cancelled Treatment:    Reason Eval/Treat Not Completed: Other (comment). Per chart review pt undergoing anterior cervical decompression and fusion to C4-C5 today. Per RN pt transferring to [facility] for surgery shortly. Will await new orders post-op for PT.   Tori Nekoda Chock PT, DPT 09/07/24, 1:14 PM

## 2024-09-07 NOTE — Progress Notes (Signed)
 Postop check.  Patient in the recovery room awakening from anesthesia without difficulty.  Patient awakens easily and is oriented.  Her speech is fluent.  Motor examination reveals good strength in both upper and lower extremities about the same as preop.  Wound clean and dry.  Neck soft.  Progressing well.

## 2024-09-07 NOTE — Anesthesia Preprocedure Evaluation (Addendum)
 Anesthesia Evaluation  Patient identified by MRN, date of birth, ID band Patient awake    Reviewed: Allergy & Precautions, NPO status , Patient's Chart, lab work & pertinent test results  History of Anesthesia Complications Negative for: history of anesthetic complications  Airway Mallampati: II  TM Distance: >3 FB Neck ROM: Limited    Dental  (+) Dental Advisory Given, Teeth Intact,    Pulmonary neg shortness of breath, sleep apnea (patient denies) , pneumonia, neg COPD, neg recent URI, former smoker   Pulmonary exam normal breath sounds clear to auscultation       Cardiovascular hypertension, Pt. on home beta blockers and Pt. on medications pulmonary hypertension+ Peripheral Vascular Disease and +CHF (EF 65-70%, grade II diastolic dysfunction)   Rhythm:Regular Rate:Normal  HLD  TTE 02/09/2023:  1. Left ventricular ejection fraction, by estimation, is 65 to 70%. The  left ventricle has normal function. Left ventricular endocardial border  not optimally defined to evaluate regional wall motion. The left  ventricular internal cavity size was mildly  dilated. There is moderate concentric left ventricular hypertrophy. Left  ventricular diastolic parameters are consistent with Grade II diastolic  dysfunction (pseudonormalization). Elevated left atrial pressure.   2. Right ventricular systolic function is normal. The right ventricular  size is normal. There is moderately elevated pulmonary artery systolic  pressure. The estimated right ventricular systolic pressure is 47.0 mmHg.   3. Left atrial size was moderately dilated.   4. A small pericardial effusion is present. The pericardial effusion is  circumferential. Large pleural effusion in the left lateral region. No  evidence of increased pericardial pressure.   5. The mitral valve is normal in structure. Trivial mitral valve  regurgitation. No evidence of mitral stenosis.   6.  The aortic valve is tricuspid. Aortic valve regurgitation is not  visualized. No aortic stenosis is present.   7. The inferior vena cava is dilated in size with <50% respiratory  variability, suggesting right atrial pressure of 15 mmHg.     Neuro/Psych neg Seizures PSYCHIATRIC DISORDERS Anxiety Depression    Chronic pain  Neuromuscular disease (lower extremity weakness, cervical disc disease)    GI/Hepatic ,GERD  Medicated,,(+) Hepatitis -Fatty liver Diverticulosis    Endo/Other  diabetes, Type 2  Class 3 obesity  Renal/GU CRFRenal disease     Musculoskeletal  (+) Arthritis ,    Abdominal  (+) + obese  Peds  Hematology  (+) Blood dyscrasia, anemia Lab Results      Component                Value               Date                      WBC                      8.0                 07/28/2023                HGB                      10.9 (L)            07/28/2023                HCT  34.7 (L)            07/28/2023                MCV                      91.8                07/28/2023                PLT                      257                 07/28/2023              Anesthesia Other Findings Last Mounjaro: 8/6  Reproductive/Obstetrics                              Anesthesia Physical Anesthesia Plan  ASA: 4  Anesthesia Plan: General   Post-op Pain Management: Tylenol  PO (pre-op)* and Gabapentin  PO (pre-op)*   Induction: Intravenous and Rapid sequence  PONV Risk Score and Plan: 4 or greater and Ondansetron , Dexamethasone , Treatment may vary due to age or medical condition and Midazolam   Airway Management Planned: Oral ETT and Video Laryngoscope Planned  Additional Equipment:   Intra-op Plan:   Post-operative Plan: Extubation in OR  Informed Consent: I have reviewed the patients History and Physical, chart, labs and discussed the procedure including the risks, benefits and alternatives for the proposed anesthesia with the  patient or authorized representative who has indicated his/her understanding and acceptance.     Dental advisory given  Plan Discussed with: CRNA  Anesthesia Plan Comments: (2 x PIV  Risks of anesthesia explained at length. This includes, but is not limited to, sore throat, damage to teeth, lips gums, tongue and vocal cords, nausea and vomiting, reactions to medications, stroke, heart attack, and death. All patient questions were answered and the patient wishes to proceed. )        Anesthesia Quick Evaluation

## 2024-09-07 NOTE — Op Note (Signed)
 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  Asst. Surgeon: Jennetta, NP  Anesthesia: General  Indication: 68 year old female remotely status post C5-C7 anterior cervical discectomy and fusion with good results.  Patient presents now with progressive neck pain interscapular pain and bilateral upper and lower extremity weakness.  Workup demonstrates evidence of marked adjacent level degeneration with a broad-based central disc herniation with severe spinal cord compression at C4-5.  Patient presents now for C4-5 anterior cervical vasectomy and fusion.  This will require removal of her prior anterior mentation from C5-C7.  C5-C7 fusion confirmed to be solid on prior CT scanning and x-rays.  Operative note: After induction of anesthesia, patient position supine with neck slightly extended and held placeholder traction.  Patient's anterior cervical region prepped draped sterilely.  Incision made overlying C5.  Dissection performed on the left.  Retractor placed.  Fluoroscopy used.  Levels confirmed.  Previously placed anterior cervical plate from R4-R2 was dissected free, this was then disassembled and removed.  Fusions at C5-6 and C6-7 were inspected and found to be solid.  Returning to the C4-5 level disc base was incised with a 15 blade.  Discectomy performed using various instruments down to level the posterior annulus.  Microscope was then brought into the field used throughout the remainder of the discectomy.  Remaining aspects of annulus and osteophytes were removed using high-speed drill down to level the posterior  longitudinal ligament.  Posterior logical moves and elevated and resected in a piecemeal fashion.  Underlying thecal sac was identified.  A wide central decompression was then performed undercutting the bodies of C4 and C5.  Decompression then proceeded into each neural foramina.  Anterior foraminotomies performed on the course the exiting C5 nerve roots bilaterally.  At this point a very thorough decompression been achieved.  There was no evidence of injury to the thecal sac and nerve roots.  Wound was then irrigated.  Gelfoam was placed topically for hemostasis and removed.  7 mm [employer]onic anatomic peek cage was then packed with locally harvested autograft.  This is then impacted in place and recessed slightly from the anterior cortical margin.  Atlantis anterior cervical plate was then placed over the C4 and C5 levels.  This then attached under fluoroscopic guidance using 13 mm variable angle screws to each of both levels.  All 4 screws given final tightening and found to be solidly within the bone.  Final images reveal good position of the cage and the hardware with proper level with normal alignment of the spine.  Wound was irrigated.  Hemostasis was achieved with bipolar cautery.  A medium Hemovac drain was left in the deep wound space.  Wound was then closed in layers with Vicryl sutures.  Steri-Strips and sterile dressing were applied.  No apparent complications.  Patient tolerated the procedure well and she returned to the recovery room postop.

## 2024-09-07 NOTE — Plan of Care (Signed)
 [facility] came to pick up pt.  Short stay RN received report.

## 2024-09-08 ENCOUNTER — Encounter (HOSPITAL_COMMUNITY): Payer: Self-pay | Admitting: Neurosurgery

## 2024-09-08 ENCOUNTER — Institutional Professional Consult (permissible substitution): Admitting: Psychology

## 2024-09-08 ENCOUNTER — Ambulatory Visit: Payer: Self-pay

## 2024-09-08 DIAGNOSIS — M4802 Spinal stenosis, cervical region: Secondary | ICD-10-CM | POA: Diagnosis not present

## 2024-09-08 DIAGNOSIS — N3000 Acute cystitis without hematuria: Secondary | ICD-10-CM | POA: Diagnosis not present

## 2024-09-08 DIAGNOSIS — I5032 Chronic diastolic (congestive) heart failure: Secondary | ICD-10-CM | POA: Diagnosis not present

## 2024-09-08 LAB — GLUCOSE, CAPILLARY
Glucose-Capillary: 169 mg/dL — ABNORMAL HIGH (ref 70–99)
Glucose-Capillary: 126 mg/dL — ABNORMAL HIGH (ref 70–99)
Glucose-Capillary: 123 mg/dL — ABNORMAL HIGH (ref 70–99)
Glucose-Capillary: 139 mg/dL — ABNORMAL HIGH (ref 70–99)
Glucose-Capillary: 108 mg/dL — ABNORMAL HIGH (ref 70–99)

## 2024-09-08 MED ORDER — INSULIN ASPART 100 UNIT/ML IJ SOLN: 0.0000 [IU] | Freq: Every day | INTRAMUSCULAR | Status: DC

## 2024-09-08 MED ORDER — INSULIN ASPART 100 UNIT/ML IJ SOLN
0.0000 [IU] | Freq: Three times a day (TID) | INTRAMUSCULAR | Status: DC
  Administered 2024-09-10 – 2024-09-11 (×2): 1 [IU] via SUBCUTANEOUS

## 2024-09-08 MED ORDER — ENSURE PLUS HIGH PROTEIN PO LIQD
237.0000 mL | Freq: Two times a day (BID) | ORAL | Status: DC
Start: 2024-09-08 — End: 2024-09-13
  Administered 2024-09-08 – 2024-09-13 (×11): 237 mL via ORAL

## 2024-09-08 NOTE — Progress Notes (Signed)
 [facility]                                                                               Maria [name], is a 68 y.o. female, DOB - [birth year], FMW:[id number] Admit date - 09/01/2024    Outpatient Primary MD for the patient is Schmerge, [name], NP  LOS - 7  days    Brief summary   68 y.o. 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  Diagnosed with cervical compression myelopathy.  Neuro surgery has evaluated and transferred from [facility] to [facility] for anterior cervical decompression and fusion at C4-5 on 9/18. Post op pain control and therapy eval are pending.    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  MRI of the entire spine showing multiple levels of stenosis. NS on board and she underwent cervical decompression and fusion surgery at C4 C5 on 9/18.  She is alert this morning ,with some neck soreness.  Pain control and PT.     Hypertension:  Well controlled.     Chronic pain syndrome:  On oxycodone  and dilaudid  for pain.  On Robaxin  for spasms. Therapy evaluations pending.     Type 2 DM with hyperglycemia  CBG (last 3)  Recent Labs    09/07/24 2354 09/08/24 0307 09/08/24 0739  GLUCAP 220* 169* 126*   Last A1c is 5.  On Mounjaro.     Chronic diastolic CHF She appears compensated. On BB, spironolactone  on hold.  Cardiology on board.    Possible UTI Completed the course of IV antibiotics.    Bilateral knee pain.  Outpatient follow up with orthopedics.     AKI Resolved.    Body mass index is 54.54 kg/m. Morbid Obesity.     Estimated body  mass index is 54.54 kg/m as calculated from the following:   Height as of this encounter: 5' 3 (1.6 m).   Weight as of this encounter: 139.7 kg.  Code Status: full code DVT Prophylaxis:  SCD's Start: 09/07/24 2005   Level of Care: Level of care: Med-Surg Family Communication: none at bedside.  Disposition Plan:     Remains inpatient appropriate:  pending therapy eval.   Procedures:  Cervical fusion surgery.   Consultants:   Cardiology neurosurgery  Antimicrobials:   Anti-infectives (From admission, onward)    Start     Dose/Rate Route Frequency Ordered Stop   09/08/24 0400  vancomycin  (VANCOCIN ) IVPB 1000 mg/200 mL premix        1,000 mg 200 mL/hr over 60 Minutes Intravenous  Once 09/07/24 2004 09/08/24 0414   09/07/24 1500  vancomycin  (VANCOREADY) IVPB 1500 mg/300 mL        1,500 mg 150 mL/hr over 120 Minutes Intravenous On call to O.R. 09/07/24 1447 09/07/24 1728   09/05/24 0900  cefTRIAXone  (ROCEPHIN ) 1 g in  sodium chloride  0.9 % 100 mL IVPB        1 g 200 mL/hr over 30 Minutes Intravenous Every 24 hours 09/04/24 1013 09/06/24 1009   09/02/24 0900  cefTRIAXone  (ROCEPHIN ) 1 g in sodium chloride  0.9 % 100 mL IVPB        1 g 200 mL/hr over 30 Minutes Intravenous Every 24 hours 09/01/24 1102 09/04/24 1000   09/01/24 0900  cefTRIAXone  (ROCEPHIN ) 1 g in sodium chloride  0.9 % 100 mL IVPB        1 g 200 mL/hr over 30 Minutes Intravenous  Once 09/01/24 0852 09/01/24 0948        Medications  Scheduled Meds:  cholecalciferol   1,000 Units Oral Daily   clonazePAM   1 mg Oral QHS   cyanocobalamin   1,000 mcg Oral Daily   famotidine   40 mg Oral QHS   feeding supplement  237 mL Oral BID BM   fluticasone   1 spray Each Nare Daily   metoprolol  succinate  25 mg Oral Daily   nystatin   1 Application Topical Daily   oxyCODONE -acetaminophen   1 tablet Oral Q8H   And   oxyCODONE   2.5 mg Oral Q8H   pantoprazole   40 mg Oral Daily   polyethylene glycol  17 g Oral Daily    pramipexole   0.5 mg Oral TID   senna-docusate  2 tablet Oral BID   sodium chloride  flush  3 mL Intravenous Q12H   Continuous Infusions:  sodium chloride  250 mL (09/08/24 0312)   PRN Meds:.acetaminophen  **OR** acetaminophen , albuterol , ALPRAZolam , HYDROmorphone  (DILAUDID ) injection, menthol  **OR** phenol, methocarbamol , ondansetron  **OR** ondansetron  (ZOFRAN ) IV, oxyCODONE , sodium chloride  flush, traZODone     Subjective:   Maria [name] was seen and examined today.    Objective:   Vitals:   09/07/24 2322 09/08/24 0304 09/08/24 0741 09/08/24 0853  BP: 130/77 (!) 126/59 117/86 (!) 131/54  Pulse: 86 87 81 76  Resp: 13 12 14    Temp: 97.8 F (36.6 C) 98.3 F (36.8 C) 97.9 F (36.6 C)   TempSrc: Oral Oral Oral   SpO2: 97% 97% 99%   Weight:      Height:        Intake/Output Summary (Last 24 hours) at 09/08/2024 0900 Last data filed at 09/08/2024 0859 Gross per 24 hour  Intake 1150 ml  Output 1855 ml  Net -705 ml   Filed Weights   09/03/24 0921 09/06/24 1253  Weight: (!) 140.2 kg (!) 139.7 kg     Exam General exam: Appears calm and comfortable , neck wound connected to the wound vac.  Respiratory system: Clear to auscultation. Respiratory effort normal. Cardiovascular system: S1 & S2 heard, RRR.  Gastrointestinal system: Abdomen is nondistended, soft and nontender.  Central nervous system: Alert and oriented.  Extremities: Symmetric 5 x 5 power. Skin: No rashes, Psychiatry: Mood & affect appropriate.     Data Reviewed:  I have personally reviewed following labs and imaging studies   CBC Lab Results  Component Value Date   WBC 6.8 09/06/2024   RBC 4.14 09/06/2024   HGB 12.3 09/06/2024   HCT 38.8 09/06/2024   MCV 93.7 09/06/2024   MCH 29.7 09/06/2024   PLT 242 09/06/2024   MCHC 31.7 09/06/2024   RDW 13.2 09/06/2024   LYMPHSABS 2.6 09/01/2024   MONOABS 0.7 09/01/2024   EOSABS 0.2 09/01/2024   BASOSABS 0.1 09/01/2024     Last metabolic panel Lab  Results  Component Value Date   NA 139 09/06/2024  K 4.5 09/06/2024   CL 103 09/06/2024   CO2 26 09/06/2024   BUN 19 09/06/2024   CREATININE 0.86 09/06/2024   GLUCOSE 106 (H) 09/06/2024   GFRNONAA >60 09/06/2024   GFRAA >60 08/06/2020   CALCIUM  9.3 09/06/2024   PHOS 4.2 02/08/2023   PROT 6.0 (L) 09/03/2024   ALBUMIN  3.5 09/03/2024   BILITOT 0.9 09/03/2024   ALKPHOS 69 09/03/2024   AST 13 (L) 09/03/2024   ALT 7 09/03/2024   ANIONGAP 11 09/06/2024    CBG (last 3)  Recent Labs    09/07/24 2354 09/08/24 0307 09/08/24 0739  GLUCAP 220* 169* 126*      Coagulation Profile: No results for input(s): INR, PROTIME in the last 168 hours.   Radiology Studies: DG Cervical Spine 1 View Result Date: 09/07/2024 EXAM: 1 VIEW XRAY OF THE CERVICAL SPINE 09/07/2024 06:40:00 PM COMPARISON: None available. CLINICAL HISTORY: [id number] Surgery, elective [id number]. ANTERIOR CERVICAL DECOMPRESSION/DISCECTOMY FUSION CERVICAL FOUR-FIVE LONEY REMOVAL CERVICAL FIVE-SEVEN PLATE ; RSTO performed by SMR; 12 seconds of fluoro; 4.49 mGy; Dr. Louis; OR 21; C-Arm 17 FINDINGS: BONES: Postsurgical changes of anterior cervical fusion hardware at C4-5. The lower cervical spine is not well visualized. DISCS AND DEGENERATIVE CHANGES: No severe degenerative changes. SOFT TISSUES: Partially visualized endotracheal tube. IMPRESSION: 1. Postsurgical changes of anterior cervical fusion hardware at C4-5, with intact hardware. 2. Lower cervical spine not well visualized. Electronically signed by: Donnice Mania MD 09/07/2024 08:36 PM EDT RP Workstation: [id number]   DG C-Arm 1-60 Min-No Report Result Date: 09/07/2024 Fluoroscopy was utilized by the requesting physician.  No radiographic interpretation.   DG C-Arm 1-60 Min-No Report Result Date: 09/07/2024 Fluoroscopy was utilized by the requesting physician.  No radiographic interpretation.   DG CHEST PORT 1 VIEW Result Date: 09/06/2024 CLINICAL DATA:  Preop for  cervical spine surgery. EXAM: PORTABLE CHEST 1 VIEW COMPARISON:  Apr 26, 2024. FINDINGS: Stable cardiomegaly. Both lungs are clear. The visualized skeletal structures are unremarkable. IMPRESSION: No active disease. Electronically Signed   By: Lynwood Landy Raddle M.D.   On: 09/06/2024 10:59       Elgie Butter M.D. [facility] 09/08/2024, 9:00 AM  Available via Epic secure chat 7am-7pm After 7 pm, please refer to night coverage provider listed on amion.

## 2024-09-08 NOTE — Progress Notes (Signed)
 Physical Therapy Re-Evaluation  Patient Details Name: Maria [name] MRN: [id number] DOB: Apr 21, 1956 Today's Date: 09/08/2024   History of Present Illness Pt is a 68yo female presenting with weakness. 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 Xray knees Chronic tricompartmental degeneration. Underwent ACDF C4-5 9/17. PMH: CHF, chronic pain, depression, DM, GERD, HLD, HTN, MRSA, cervical disc surgery, chronic LE lymphedema.    PT Comments  PT re-evaluation following ACDF 9/18. Pt required +2 mod assist bed mobility, and +2 mod assist sit to stand with RW. Pt maintaining flexed posture in stance and only able to sustain for a few seconds. Pt supine in bed at end of session. Previous recommendation for further therapy in inpatient setting < 3 hours/day remains appropriate. Goals reviewed and updated as needed.     If plan is discharge home, recommend the following: Assist for transportation;A little help with bathing/dressing/bathroom;A little help with walking and/or transfers;Two people to help with walking and/or transfers;Two people to help with bathing/dressing/bathroom   Can travel by private vehicle     No  Equipment Recommendations  Wheelchair (measurements PT);Wheelchair cushion (measurements PT);Hospital bed    Recommendations for Other Services       Precautions / Restrictions Precautions Precautions: Fall;Cervical Recall of Precautions/Restrictions: Intact     Mobility  Bed Mobility Overal bed mobility: Needs Assistance Bed Mobility: Supine to Sit, Sit to Supine     Supine to sit: Mod assist, +2 for physical assistance, Used rails Sit to supine: +2 for physical assistance, Max assist   General bed mobility comments: cues for sequencing, increased time    Transfers Overall transfer level: Needs assistance Equipment used: Rolling walker (2 wheels) Transfers: Sit to/from Stand Sit to Stand: Mod assist, +2 physical assistance, From elevated  surface           General transfer comment: STS x 2 trials from EOB. Pt maintaining flexed position. Only able to sustain stance a few seconds.    Ambulation/Gait               General Gait Details: unable due to bilat knee pain   Stairs             Wheelchair Mobility     Tilt Bed    Modified Rankin (Stroke Patients Only)       Balance Overall balance assessment: Needs assistance, History of Falls Sitting-balance support: Feet supported Sitting balance-Leahy Scale: Fair     Standing balance support: Reliant on assistive device for balance, During functional activity, Bilateral upper extremity supported Standing balance-Leahy Scale: Poor                              Communication Communication Communication: No apparent difficulties  Cognition Arousal: Alert Behavior During Therapy: WFL for tasks assessed/performed   PT - Cognitive impairments: No apparent impairments                         Following commands: Intact      Cueing Cueing Techniques: Verbal cues  Exercises Other Exercises Other Exercises: Instructed in LE HEP: ankle pumps, hip abd/add, heel slides    General Comments General comments (skin integrity, edema, etc.): VSS on RA      Pertinent Vitals/Pain Pain Assessment Pain Assessment: Faces Faces Pain Scale: Hurts even more Pain Location: bilat knees in stance Pain Descriptors / Indicators: Grimacing, Moaning, Guarding Pain Intervention(s): Monitored  during session, Limited activity within patient's tolerance    Home Living Family/patient expects to be discharged to:: Private residence Living Arrangements: Alone Available Help at Discharge: Family Type of Home: Apartment Home Access: Level entry       Home Layout: One level Home Equipment: Hospital bed;Tub bench;Rolling Walker (2 wheels);Rollator (4 wheels);BSC/3in1;Wheelchair - manual Additional Comments: bedside commode frame placed over  toilet, received a wide Wheelchair recently that does not fit through doors and does not work properly per pt    Prior Function            PT Goals (current goals can now be found in the care plan section) Acute Rehab PT Goals Patient Stated Goal: home PT Goal Formulation: With patient Time For Goal Achievement: 09/22/24 Potential to Achieve Goals: Good Progress towards PT goals: Progressing toward goals    Frequency    Min 3X/week      PT Plan      Co-evaluation   Reason for Co-Treatment: For patient/therapist safety;To address functional/ADL transfers PT goals addressed during session: Mobility/safety with mobility;Proper use of DME;Balance OT goals addressed during session: ADL's and self-care;Proper use of Adaptive equipment and DME      AM-PAC PT 6 Clicks Mobility   Outcome Measure  Help needed turning from your back to your side while in a flat bed without using bedrails?: A Lot Help needed moving from lying on your back to sitting on the side of a flat bed without using bedrails?: A Lot Help needed moving to and from a bed to a chair (including a wheelchair)?: Total Help needed standing up from a chair using your arms (e.g., wheelchair or bedside chair)?: Total Help needed to walk in hospital room?: Total Help needed climbing 3-5 steps with a railing? : Total 6 Click Score: 8    End of Session Equipment Utilized During Treatment: Gait belt Activity Tolerance: Patient limited by pain Patient left: in bed;with call bell/phone within reach Nurse Communication: Mobility status PT Visit Diagnosis: Muscle weakness (generalized) (M62.81);Difficulty in walking, not elsewhere classified (R26.2);Pain Pain - part of body: Knee     Time: 9043-8968 PT Time Calculation (min) (ACUTE ONLY): 35 min  Charges:      PT General Charges $$ ACUTE PT VISIT: 1 Visit                     Sari MATSU., PT  Office # [phone]    Erven Sari Shaker 09/08/2024, 1:04 PM

## 2024-09-08 NOTE — Progress Notes (Signed)
 Progress Note  Patient Name: Maria [name] Date of Encounter: 09/08/2024  Primary Cardiologist:   Lynwood Schilling, MD   Subjective   Neck soreness  Inpatient Medications    Scheduled Meds:  cholecalciferol   1,000 Units Oral Daily   clonazePAM   1 mg Oral QHS   cyanocobalamin   1,000 mcg Oral Daily   famotidine   40 mg Oral QHS   fluticasone   1 spray Each Nare Daily   metoprolol  succinate  25 mg Oral Daily   nystatin   1 Application Topical Daily   oxyCODONE -acetaminophen   1 tablet Oral Q8H   And   oxyCODONE   2.5 mg Oral Q8H   pantoprazole   40 mg Oral Daily   polyethylene glycol  17 g Oral Daily   pramipexole   0.5 mg Oral TID   senna-docusate  2 tablet Oral BID   sodium chloride  flush  3 mL Intravenous Q12H   Continuous Infusions:  sodium chloride  250 mL (09/08/24 0312)   PRN Meds: acetaminophen  **OR** acetaminophen , albuterol , ALPRAZolam , HYDROmorphone  (DILAUDID ) injection, menthol  **OR** phenol, methocarbamol , ondansetron  **OR** ondansetron  (ZOFRAN ) IV, oxyCODONE , sodium chloride  flush, traZODone    Vital Signs    Vitals:   09/07/24 2006 09/07/24 2322 09/08/24 0304 09/08/24 0741  BP: (!) 123/56 130/77 (!) 126/59 117/86  Pulse: 82 86 87 81  Resp: 18 13 12 14   Temp: (!) 97.5 F (36.4 C) 97.8 F (36.6 C) 98.3 F (36.8 C) 97.9 F (36.6 C)  TempSrc: Oral Oral Oral Oral  SpO2: 95% 97% 97% 99%  Weight:      Height:        Intake/Output Summary (Last 24 hours) at 09/08/2024 0753 Last data filed at 09/08/2024 0527 Gross per 24 hour  Intake 1140 ml  Output 1855 ml  Net -715 ml   Filed Weights   09/03/24 0921 09/06/24 1253  Weight: (!) 140.2 kg (!) 139.7 kg    Telemetry    NSR - Personally Reviewed  ECG    NA - Personally Reviewed  Physical Exam   GEN: No acute distress.   Neck: No  JVD Cardiac: RRR, 2/6 apical systolic murmur, no diastolic murmurs, rubs, or gallops.  Respiratory: Clear  to auscultation bilaterally. GI: Soft, nontender,  non-distended  MS: No  edema; No deformity. Neuro:  Nonfocal  Psych: Normal affect   Labs    Chemistry Recent Labs  Lab 09/03/24 0406 09/04/24 0349 09/06/24 0425  NA 138 138 139  K 3.9 4.2 4.5  CL 99 100 103  CO2 29 27 26   GLUCOSE 95 105* 106*  BUN 12 12 19   CREATININE 1.10* 0.90 0.86  CALCIUM  9.0 9.6 9.3  PROT 6.0*  --   --   ALBUMIN  3.5  --   --   AST 13*  --   --   ALT 7  --   --   ALKPHOS 69  --   --   BILITOT 0.9  --   --   GFRNONAA 55* >60 >60  ANIONGAP 10 11 11      Hematology Recent Labs  Lab 09/02/24 0506 09/03/24 0618 09/06/24 0425  WBC 6.1 6.2 6.8  RBC 3.97 3.94 4.14  HGB 11.4* 11.8* 12.3  HCT 36.8 36.9 38.8  MCV 92.7 93.7 93.7  MCH 28.7 29.9 29.7  MCHC 31.0 32.0 31.7  RDW 13.3 12.9 13.2  PLT 249 234 242    Cardiac EnzymesNo results for input(s): TROPONINI in the last 168 hours. No results for input(s): TROPIPOC in the  last 168 hours.   BNPNo results for input(s): BNP, PROBNP in the last 168 hours.   DDimer No results for input(s): DDIMER in the last 168 hours.   Radiology    DG Cervical Spine 1 View Result Date: 09/07/2024 EXAM: 1 VIEW XRAY OF THE CERVICAL SPINE 09/07/2024 06:40:00 PM COMPARISON: None available. CLINICAL HISTORY: [id number] Surgery, elective [id number]. ANTERIOR CERVICAL DECOMPRESSION/DISCECTOMY FUSION CERVICAL FOUR-FIVE LONEY REMOVAL CERVICAL FIVE-SEVEN PLATE ; RSTO performed by SMR; 12 seconds of fluoro; 4.49 mGy; Dr. Louis; OR 21; C-Arm 17 FINDINGS: BONES: Postsurgical changes of anterior cervical fusion hardware at C4-5. The lower cervical spine is not well visualized. DISCS AND DEGENERATIVE CHANGES: No severe degenerative changes. SOFT TISSUES: Partially visualized endotracheal tube. IMPRESSION: 1. Postsurgical changes of anterior cervical fusion hardware at C4-5, with intact hardware. 2. Lower cervical spine not well visualized. Electronically signed by: Donnice Mania MD 09/07/2024 08:36 PM EDT RP Workstation: [id number]    DG C-Arm 1-60 Min-No Report Result Date: 09/07/2024 Fluoroscopy was utilized by the requesting physician.  No radiographic interpretation.   DG C-Arm 1-60 Min-No Report Result Date: 09/07/2024 Fluoroscopy was utilized by the requesting physician.  No radiographic interpretation.   DG CHEST PORT 1 VIEW Result Date: 09/06/2024 CLINICAL DATA:  Preop for cervical spine surgery. EXAM: PORTABLE CHEST 1 VIEW COMPARISON:  Apr 26, 2024. FINDINGS: Stable cardiomegaly. Both lungs are clear. The visualized skeletal structures are unremarkable. IMPRESSION: No active disease. Electronically Signed   By: Lynwood Landy Raddle M.D.   On: 09/06/2024 10:59    Cardiac Studies   NA  Patient Profile     68 y.o. female  with a hx of chronic diastolic heart failure, hypertension, hyperlipidemia, GERD, anxiety/depression, and T2DM who had presented to the ED for progressive weakness. 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 Neurosurgery was consulted, patient to undergo anterior cervical decompression and fusion to C4-C5. Cardiology was consulted for pre-operative evaluation, no further work-up was recommended as patient was optimized. We continue to follow for fluid management this admission as she developed an AKI and was removed from her diuretics.   Assessment & Plan    Chronic diastolic HF:  She is now post op.    Diuretics have been held .   HTN:   BP is labile. Continue metoprolol .  Hold spironolactone  and diuretics for today.    I would resume the previous meds at discharge.    Status post C4-5 anterior cervical fusion.    Please call with further questions.    For questions or updates, please contact [facility] Please consult [email or web address] for contact info under Cardiology/STEMI.   Signed, Lynwood Schilling, MD  09/08/2024, 7:53 AM

## 2024-09-08 NOTE — Care Management Important Message (Signed)
 Important Message  Patient Details  Name: Rosealynn Mateus Stradling MRN: [id number] Date of Birth: [birth year]   Important Message Given:  Yes - Medicare IM     Jon Cruel 09/08/2024, 3:37 PM

## 2024-09-08 NOTE — Progress Notes (Signed)
    Providing Compassionate, Quality Care - Together   NEUROSURGERY PROGRESS NOTE     S: No issues overnight.    O: EXAM:  BP 117/86 (BP Location: Left Arm)   Pulse 81   Temp 97.9 F (36.6 C) (Oral)   Resp 14   Ht 5' 3 (1.6 m)   Wt (!) 139.7 kg   SpO2 99%   BMI 54.54 kg/m     Awake, alert, oriented  Speech fluent, appropriate  BUE/LLE 5/5, RLE 4+/5 SILTx4 Dressing c/d/I Hemovac in place   ASSESSMENT:  68 y.o. with C4-5 stenosis with myelopathy s/p ACDF C4-5    PLAN: -Continue supportive care -Therapies as tolerated -Call w/ questions/concerns.   Camie Pickle, [facility]

## 2024-09-08 NOTE — Evaluation (Signed)
 Occupational Therapy Re-Evaluation Patient Details Name: Maria [name] MRN: [id number] DOB: 05-25-1956 Today's Date: 09/08/2024   History of Present Illness   Pt is a 68yo female presenting with weakness. 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 Xray knees Chronic tricompartmental degeneration. Underwent ACDF C4-5 9/17. PMH: CHF, chronic pain, depression, DM, GERD, HLD, HTN, MRSA, cervical disc surgery, chronic LE lymphedema.     Clinical Impressions Pt seen for first OT session s/p ACDF noted above. Pt requiring increased assistance from prior OT session w/ +2 assistance needed for bed mobility and brief standing attempts. Due to increased B knee pain, unable to tolerate prolonged standing or take steps today. Pt remains hopeful for improvements to return home but is open to postacute rehab stay with therapy < 3 hours per day at DC.     If plan is discharge home, recommend the following:   Two people to help with walking and/or transfers;A lot of help with bathing/dressing/bathroom     Functional Status Assessment   Patient has had a recent decline in their functional status and demonstrates the ability to make significant improvements in function in a reasonable and predictable amount of time.     Equipment Recommendations   Other (comment) (TBD pending progress)     Recommendations for Other Services         Precautions/Restrictions   Precautions Precautions: Fall Recall of Precautions/Restrictions: Intact Restrictions Weight Bearing Restrictions Per Provider Order: No     Mobility Bed Mobility Overal bed mobility: Needs Assistance Bed Mobility: Supine to Sit, Sit to Supine     Supine to sit: Mod assist, +2 for safety/equipment, +2 for physical assistance, Used rails Sit to supine: +2 for physical assistance, +2 for safety/equipment, Max assist   General bed mobility comments: Able to assist with LE to EOB, requires assist to scoot  hips to EOB and lift trunk. Max A x 2 to return to supine with assist for trunk and LE. Increased assist as pt close to edge and foot of bed    Transfers Overall transfer level: Needs assistance Equipment used: Rolling walker (2 wheels) Transfers: Sit to/from Stand Sit to Stand: Mod assist, +2 physical assistance, +2 safety/equipment, From elevated surface           General transfer comment: Two standing trials with Mod A x 2 and difficulty maintaining position due to B knee pain. Unable to take steps today d/t pain      Balance Overall balance assessment: Needs assistance, History of Falls Sitting-balance support: Feet supported Sitting balance-Leahy Scale: Fair     Standing balance support: Reliant on assistive device for balance, During functional activity, Bilateral upper extremity supported Standing balance-Leahy Scale: Poor                             ADL either performed or assessed with clinical judgement   ADL Overall ADL's : Needs assistance/impaired Eating/Feeding: Set up;Sitting   Grooming: Set up;Sitting   Upper Body Bathing: Minimal assistance;Sitting   Lower Body Bathing: Maximal assistance;+2 for safety/equipment;+2 for physical assistance;Sit to/from stand;Sitting/lateral leans   Upper Body Dressing : Minimal assistance;Sitting   Lower Body Dressing: Total assistance;+2 for physical assistance;+2 for safety/equipment;Sit to/from stand;Sitting/lateral leans       Toileting- Clothing Manipulation and Hygiene: Total assistance;Bed level               Vision Baseline Vision/History: 1 Wears glasses  Ability to See in Adequate Light: 0 Adequate Patient Visual Report: Blurring of vision (s/p sx- improving) Vision Assessment?: No apparent visual deficits     Perception         Praxis         Pertinent Vitals/Pain Pain Assessment Pain Assessment: 0-10 Pain Score: 8  Pain Location: B knees (L more than R) Pain Descriptors /  Indicators: Guarding, Grimacing, Moaning Pain Intervention(s): Monitored during session, Limited activity within patient's tolerance, Other (comment) (recently given pain meds)     Extremity/Trunk Assessment Upper Extremity Assessment Upper Extremity Assessment: Generalized weakness;Right hand dominant;RUE deficits/detail RUE Deficits / Details: reports some sensation changes, nerve pain and initial numbness. was given theraputty HEP   Lower Extremity Assessment Lower Extremity Assessment: Defer to PT evaluation   Cervical / Trunk Assessment Cervical / Trunk Assessment: Normal   Communication Communication Communication: No apparent difficulties   Cognition Arousal: Alert Behavior During Therapy: WFL for tasks assessed/performed Cognition: No apparent impairments                               Following commands: Intact       Cueing  General Comments   Cueing Techniques: Verbal cues      Exercises     Shoulder Instructions      Home Living Family/patient expects to be discharged to:: Private residence Living Arrangements: Alone Available Help at Discharge: Family Type of Home: Apartment Home Access: Level entry     Home Layout: One level     Bathroom Shower/Tub: Chief Strategy Officer: Handicapped height Bathroom Accessibility: No   Home Equipment: Hospital bed;Tub bench;Rolling Walker (2 wheels);Rollator (4 wheels);BSC/3in1;Wheelchair - manual   Additional Comments: bedside commode frame placed over toilet, received a wide Wheelchair recently that does not fit through doors and does not work properly per pt      Prior Functioning/Environment Prior Level of Function : History of Falls (last six months);Independent/Modified Independent             Mobility Comments: Uses a Rollator in home, WC as well ADLs Comments: BSC over toilet. alternate sponge bathing and recently getting in shower since now has a tub bench. Orders  groceries, prescriptions, insurance provides transport to MD when needed    OT Problem List: Decreased strength;Decreased safety awareness;Decreased activity tolerance;Obesity;Pain   OT Treatment/Interventions: Self-care/ADL training;Patient/family education;DME and/or AE instruction      OT Goals(Current goals can be found in the care plan section)   Acute Rehab OT Goals Patient Stated Goal: hopeful for continued improvements, open to rehab but hopeful to be able to go home OT Goal Formulation: With patient Time For Goal Achievement: 09/16/24 Potential to Achieve Goals: Good   OT Frequency:  Min 2X/week    Co-evaluation PT/OT/SLP Co-Evaluation/Treatment: Yes Reason for Co-Treatment: For patient/therapist safety;To address functional/ADL transfers PT goals addressed during session: Mobility/safety with mobility;Proper use of DME OT goals addressed during session: ADL's and self-care;Proper use of Adaptive equipment and DME      AM-PAC OT 6 Clicks Daily Activity     Outcome Measure Help from another person eating meals?: A Little Help from another person taking care of personal grooming?: A Little Help from another person toileting, which includes using toliet, bedpan, or urinal?: Total Help from another person bathing (including washing, rinsing, drying)?: A Lot Help from another person to put on and taking off regular upper body clothing?: A Little  Help from another person to put on and taking off regular lower body clothing?: Total 6 Click Score: 13   End of Session Equipment Utilized During Treatment: Gait belt;Rolling walker (2 wheels) Nurse Communication: Mobility status  Activity Tolerance: Patient limited by pain Patient left: in bed;with call bell/phone within reach;with bed alarm set  OT Visit Diagnosis: Unsteadiness on feet (R26.81);Other abnormalities of gait and mobility (R26.89);Muscle weakness (generalized) (M62.81)                Time: 9045-8972 OT Time  Calculation (min): 33 min Charges:  OT General Charges $OT Visit: 1 Visit OT Evaluation $OT Re-eval: 1 Re-eval  Mliss NOVAK, OTR/L Acute Rehab Services Office: [phone]   Mliss Fish 09/08/2024, 10:52 AM

## 2024-09-08 NOTE — Anesthesia Postprocedure Evaluation (Signed)
 Anesthesia Post Note  Patient: Maria [name]  Procedure(s) Performed: ANTERIOR CERVICAL DECOMPRESSION/DISCECTOMY FUSION CERVICAL FOUR-FIVE LONEY REMOVAL CERVICAL FIVE-SEVEN PLATE (Spine Cervical)     Patient location during evaluation: PACU Anesthesia Type: General Level of consciousness: awake and alert Pain management: pain level controlled Vital Signs Assessment: post-procedure vital signs reviewed and stable Respiratory status: spontaneous breathing, nonlabored ventilation, respiratory function stable and patient connected to nasal cannula oxygen Cardiovascular status: blood pressure returned to baseline and stable Postop Assessment: no apparent nausea or vomiting Anesthetic complications: no   No notable events documented.  Last Vitals:  Vitals:   09/07/24 2322 09/08/24 0304  BP: 130/77 (!) 126/59  Pulse: 86 87  Resp: 13 12  Temp: 36.6 C 36.8 C  SpO2: 97% 97%    Last Pain:  Vitals:   09/08/24 0304  TempSrc: Oral  PainSc:    Pain Goal:                   [name]

## 2024-09-09 ENCOUNTER — Inpatient Hospital Stay (HOSPITAL_COMMUNITY)

## 2024-09-09 DIAGNOSIS — H532 Diplopia: Secondary | ICD-10-CM | POA: Diagnosis not present

## 2024-09-09 DIAGNOSIS — I5032 Chronic diastolic (congestive) heart failure: Secondary | ICD-10-CM | POA: Diagnosis not present

## 2024-09-09 DIAGNOSIS — N3000 Acute cystitis without hematuria: Secondary | ICD-10-CM | POA: Diagnosis not present

## 2024-09-09 LAB — GLUCOSE, CAPILLARY
Glucose-Capillary: 105 mg/dL — ABNORMAL HIGH (ref 70–99)
Glucose-Capillary: 66 mg/dL — ABNORMAL LOW (ref 70–99)
Glucose-Capillary: 75 mg/dL (ref 70–99)
Glucose-Capillary: 81 mg/dL (ref 70–99)
Glucose-Capillary: 109 mg/dL — ABNORMAL HIGH (ref 70–99)
Glucose-Capillary: 119 mg/dL — ABNORMAL HIGH (ref 70–99)

## 2024-09-09 NOTE — Progress Notes (Signed)
 [facility]                                                                               Maria [name], is a 68 y.o. female, DOB - 10-25-1956, FMW:[id number] Admit date - 09/01/2024    Outpatient Primary MD for the patient is Schmerge, [name], NP  LOS - 8  days    Brief summary   68 y.o. 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  Diagnosed with cervical compression myelopathy.  Neuro surgery has evaluated and transferred from [facility] to [facility] for anterior cervical decompression and fusion at C4-5 on 9/18. Post op pain control and therapy eval done, recommending SNF.    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  MRI of the entire spine showing multiple levels of stenosis. NS on board and she underwent cervical decompression and fusion surgery at C4 C5 on 9/18.  She is alert this morning ,with some neck soreness.  Pain control and therapy   Double vision since the night of surgery.  Persistent.  MRI brain without any new stroke.  She also reports floaters since 2 months.  Recommend outpatient follow up with ophthalmology.     Hypertension:  Well controlled BP parameters.     Chronic pain syndrome:  On oxycodone  and dilaudid  for pain.  On Robaxin  for spasms. Therapy evaluation done and recommended SNF.     Type 2 DM with hyperglycemia  CBG (last 3)  Recent Labs    09/09/24 0313 09/09/24 0806 09/09/24 0829  GLUCAP 105* 66* 75   Last A1c is 5. Recommend a snack before she goes to bed.  On Mounjaro.     Chronic diastolic CHF She appears compensated. On  BB, spironolactone  on hold.  Cardiology on board.    Possible UTI Completed the course of IV antibiotics.    Bilateral knee pain.  Outpatient follow up with orthopedics.     AKI Resolved.    Body mass index is 54.54 kg/m. Morbid Obesity.     Estimated body mass index is 54.54 kg/m as calculated from the following:   Height as of this encounter: 5' 3 (1.6 m).   Weight as of this encounter: 139.7 kg.  Code Status: full code DVT Prophylaxis:  SCD's Start: 09/07/24 2005   Level of Care: Level of care: Med-Surg Family Communication: none at bedside.  Disposition Plan:     Remains inpatient appropriate:  SNF when bed available.   Procedures:  Cervical fusion surgery.   Consultants:   Cardiology neurosurgery  Antimicrobials:   Anti-infectives (From admission, onward)    Start     Dose/Rate Route Frequency Ordered Stop   09/08/24 0400  vancomycin  (VANCOCIN ) IVPB 1000 mg/200 mL premix        1,000 mg 200 mL/hr over 60 Minutes Intravenous  Once  09/07/24 2004 09/08/24 0414   09/07/24 1500  vancomycin  (VANCOREADY) IVPB 1500 mg/300 mL        1,500 mg 150 mL/hr over 120 Minutes Intravenous On call to O.R. 09/07/24 1447 09/07/24 1728   09/05/24 0900  cefTRIAXone  (ROCEPHIN ) 1 g in sodium chloride  0.9 % 100 mL IVPB        1 g 200 mL/hr over 30 Minutes Intravenous Every 24 hours 09/04/24 1013 09/06/24 1009   09/02/24 0900  cefTRIAXone  (ROCEPHIN ) 1 g in sodium chloride  0.9 % 100 mL IVPB        1 g 200 mL/hr over 30 Minutes Intravenous Every 24 hours 09/01/24 1102 09/04/24 1000   09/01/24 0900  cefTRIAXone  (ROCEPHIN ) 1 g in sodium chloride  0.9 % 100 mL IVPB        1 g 200 mL/hr over 30 Minutes Intravenous  Once 09/01/24 0852 09/01/24 0948        Medications  Scheduled Meds:  cholecalciferol   1,000 Units Oral Daily   clonazePAM   1 mg Oral QHS   cyanocobalamin   1,000 mcg Oral Daily   famotidine   40 mg Oral QHS   feeding supplement  237 mL Oral BID BM    fluticasone   1 spray Each Nare Daily   insulin  aspart  0-5 Units Subcutaneous QHS   insulin  aspart  0-9 Units Subcutaneous TID WC   metoprolol  succinate  25 mg Oral Daily   nystatin   1 Application Topical Daily   oxyCODONE -acetaminophen   1 tablet Oral Q8H   And   oxyCODONE   2.5 mg Oral Q8H   pantoprazole   40 mg Oral Daily   polyethylene glycol  17 g Oral Daily   pramipexole   0.5 mg Oral TID   senna-docusate  2 tablet Oral BID   sodium chloride  flush  3 mL Intravenous Q12H   Continuous Infusions:   PRN Meds:.acetaminophen  **OR** acetaminophen , albuterol , ALPRAZolam , HYDROmorphone  (DILAUDID ) injection, menthol  **OR** phenol, methocarbamol , ondansetron  **OR** ondansetron  (ZOFRAN ) IV, oxyCODONE , sodium chloride  flush, traZODone     Subjective:   Maria [name] was seen and examined today.  Diplopia.   Objective:   Vitals:   09/08/24 1545 09/08/24 1916 09/09/24 0315 09/09/24 0812  BP: (!) 109/43 (!) 126/52  (!) 110/47  Pulse: 76 73  71  Resp: 18 18 20 20   Temp: 97.9 F (36.6 C) 98.2 F (36.8 C) 98.5 F (36.9 C) 98.6 F (37 C)  TempSrc: Oral Oral Oral Axillary  SpO2: 98% 95%  98%  Weight:      Height:        Intake/Output Summary (Last 24 hours) at 09/09/2024 0921 Last data filed at 09/09/2024 0910 Gross per 24 hour  Intake 3 ml  Output 805 ml  Net -802 ml   Filed Weights   09/03/24 0921 09/06/24 1253  Weight: (!) 140.2 kg (!) 139.7 kg     Exam General exam: Appears calm and comfortable , wound vac in place.  Respiratory system: Clear to auscultation. Respiratory effort normal. Cardiovascular system: S1 & S2 heard, RRR. No JVD,  Gastrointestinal system: Abdomen is nondistended, soft and nontender. Central nervous system: Alert and oriented. No focal neurological deficits. Extremities: Symmetric 5 x 5 power. Skin: No rashes,  Psychiatry: Mood & affect appropriate.      Data Reviewed:  I have personally reviewed following labs and imaging studies   CBC Lab  Results  Component Value Date   WBC 6.8 09/06/2024   RBC 4.14 09/06/2024   HGB 12.3 09/06/2024  HCT 38.8 09/06/2024   MCV 93.7 09/06/2024   MCH 29.7 09/06/2024   PLT 242 09/06/2024   MCHC 31.7 09/06/2024   RDW 13.2 09/06/2024   LYMPHSABS 2.6 09/01/2024   MONOABS 0.7 09/01/2024   EOSABS 0.2 09/01/2024   BASOSABS 0.1 09/01/2024     Last metabolic panel Lab Results  Component Value Date   NA 139 09/06/2024   K 4.5 09/06/2024   CL 103 09/06/2024   CO2 26 09/06/2024   BUN 19 09/06/2024   CREATININE 0.86 09/06/2024   GLUCOSE 106 (H) 09/06/2024   GFRNONAA >60 09/06/2024   GFRAA >60 08/06/2020   CALCIUM  9.3 09/06/2024   PHOS 4.2 02/08/2023   PROT 6.0 (L) 09/03/2024   ALBUMIN  3.5 09/03/2024   BILITOT 0.9 09/03/2024   ALKPHOS 69 09/03/2024   AST 13 (L) 09/03/2024   ALT 7 09/03/2024   ANIONGAP 11 09/06/2024    CBG (last 3)  Recent Labs    09/09/24 0313 09/09/24 0806 09/09/24 0829  GLUCAP 105* 66* 75      Coagulation Profile: No results for input(s): INR, PROTIME in the last 168 hours.   Radiology Studies: DG Cervical Spine 1 View Result Date: 09/07/2024 EXAM: 1 VIEW XRAY OF THE CERVICAL SPINE 09/07/2024 06:40:00 PM COMPARISON: None available. CLINICAL HISTORY: [id number] Surgery, elective [id number]. ANTERIOR CERVICAL DECOMPRESSION/DISCECTOMY FUSION CERVICAL FOUR-FIVE LONEY REMOVAL CERVICAL FIVE-SEVEN PLATE ; RSTO performed by SMR; 12 seconds of fluoro; 4.49 mGy; Dr. Louis; OR 21; C-Arm 17 FINDINGS: BONES: Postsurgical changes of anterior cervical fusion hardware at C4-5. The lower cervical spine is not well visualized. DISCS AND DEGENERATIVE CHANGES: No severe degenerative changes. SOFT TISSUES: Partially visualized endotracheal tube. IMPRESSION: 1. Postsurgical changes of anterior cervical fusion hardware at C4-5, with intact hardware. 2. Lower cervical spine not well visualized. Electronically signed by: Donnice Mania MD 09/07/2024 08:36 PM EDT RP Workstation:  [id number]   DG C-Arm 1-60 Min-No Report Result Date: 09/07/2024 Fluoroscopy was utilized by the requesting physician.  No radiographic interpretation.   DG C-Arm 1-60 Min-No Report Result Date: 09/07/2024 Fluoroscopy was utilized by the requesting physician.  No radiographic interpretation.       Elgie Butter M.D. [facility] 09/09/2024, 9:21 AM  Available via Epic secure chat 7am-7pm After 7 pm, please refer to night coverage provider listed on amion.

## 2024-09-09 NOTE — Progress Notes (Signed)
Pt return to unit from MRI.

## 2024-09-09 NOTE — Progress Notes (Signed)
 Pt off unit to MRI.

## 2024-09-09 NOTE — Progress Notes (Signed)
  NEUROSURGERY PROGRESS NOTE   No issues overnight. Pt without complaint this am.  EXAM:  BP (!) 110/47 (BP Location: Left Arm)   Pulse 71   Temp 98.6 F (37 C) (Axillary)   Resp 20   Ht 5' 3 (1.6 m)   Wt (!) 139.7 kg   SpO2 98%   BMI 54.54 kg/m   Awake, alert, oriented  Speech fluent, appropriate  CN grossly intact  5/5 BUE, mild BLE weakness R>L Hemovac in place, 55cc x 24hrs   IMPRESSION:  68 y.o. female POD# 2 s/p C4-5 ACDF for myelopathy, recovering appropriately  PLAN: - Will cont Hemovac today, likely d/c tomorrow. Pt has had reoperation for hematoma after previous ACDF. - Cont PT/OT - Dispo planning   Gerldine Maizes, MD [facility]

## 2024-09-09 NOTE — Progress Notes (Signed)
 Hypoglycemic Event  CBG: 66  Treatment: 4 oz juice/soda  Symptoms: None  Follow-up CBG: Time:0829 CBG Result:75  Possible Reasons for Event: Inadequate meal intake

## 2024-09-10 DIAGNOSIS — N3000 Acute cystitis without hematuria: Secondary | ICD-10-CM | POA: Diagnosis not present

## 2024-09-10 DIAGNOSIS — I5032 Chronic diastolic (congestive) heart failure: Secondary | ICD-10-CM | POA: Diagnosis not present

## 2024-09-10 DIAGNOSIS — M4802 Spinal stenosis, cervical region: Secondary | ICD-10-CM | POA: Diagnosis not present

## 2024-09-10 LAB — BASIC METABOLIC PANEL WITH GFR
Anion gap: 8 (ref 5–15)
BUN: 18 mg/dL (ref 8–23)
CO2: 27 mmol/L (ref 22–32)
Calcium: 8.6 mg/dL — ABNORMAL LOW (ref 8.9–10.3)
Chloride: 102 mmol/L (ref 98–111)
Creatinine, Ser: 0.91 mg/dL (ref 0.44–1.00)
GFR, Estimated: >60 mL/min (ref >60)
Glucose, Bld: 98 mg/dL (ref 70–99)
Potassium: 4.1 mmol/L (ref 3.5–5.1)
Sodium: 137 mmol/L (ref 135–145)

## 2024-09-10 LAB — GLUCOSE, CAPILLARY
Glucose-Capillary: 98 mg/dL (ref 70–99)
Glucose-Capillary: 144 mg/dL — ABNORMAL HIGH (ref 70–99)
Glucose-Capillary: 84 mg/dL (ref 70–99)
Glucose-Capillary: 118 mg/dL — ABNORMAL HIGH (ref 70–99)
Glucose-Capillary: 107 mg/dL — ABNORMAL HIGH (ref 70–99)

## 2024-09-10 LAB — CBC WITH DIFFERENTIAL/PLATELET
Abs Immature Granulocytes: 0.04 K/uL (ref 0.00–0.07)
Basophils Absolute: 0 K/uL (ref 0.0–0.1)
Basophils Relative: 1 %
Eosinophils Absolute: 0.2 K/uL (ref 0.0–0.5)
Eosinophils Relative: 3 %
HCT: 34.8 % — ABNORMAL LOW (ref 36.0–46.0)
Hemoglobin: 11.3 g/dL — ABNORMAL LOW (ref 12.0–15.0)
Immature Granulocytes: 1 %
Lymphocytes Relative: 33 %
Lymphs Abs: 2.8 K/uL (ref 0.7–4.0)
MCH: 29.1 pg (ref 26.0–34.0)
MCHC: 32.5 g/dL (ref 30.0–36.0)
MCV: 89.7 fL (ref 80.0–100.0)
Monocytes Absolute: 0.8 K/uL (ref 0.1–1.0)
Monocytes Relative: 9 %
Neutro Abs: 4.6 K/uL (ref 1.7–7.7)
Neutrophils Relative %: 53 %
Platelets: 237 K/uL (ref 150–400)
RBC: 3.88 MIL/uL (ref 3.87–5.11)
RDW: 13 % (ref 11.5–15.5)
WBC: 8.4 K/uL (ref 4.0–10.5)
nRBC: 0 % (ref 0.0–0.2)

## 2024-09-10 MED ORDER — LACTATED RINGERS IV BOLUS
500.0000 mL | Freq: Once | INTRAVENOUS | Status: AC
  Administered 2024-09-10: 500 mL via INTRAVENOUS

## 2024-09-10 NOTE — Progress Notes (Signed)
 This RN removed patient's hemovac per PA's order. Hemovac had an output of 10 mL of BRB. Patient tolerated removal well. Dressed site with petroleum gauze and tegaderm.

## 2024-09-10 NOTE — Progress Notes (Signed)
    Providing Compassionate, Quality Care - Together   NEUROSURGERY PROGRESS NOTE     S: NAEs o/n.    O: EXAM:  BP (!) 130/51 (BP Location: Left Arm)   Pulse 68   Temp (!) 97.4 F (36.3 C) (Oral)   Resp 14   Ht 5' 3 (1.6 m)   Wt (!) 139.7 kg   SpO2 98%   BMI 54.54 kg/m     Awake, alert, oriented  Speech fluent, appropriate  CN grossly intact  5/5 BUE, mild BLE weakness R>L Hemovac in place Incision c/d/I/   ASSESSMENT:  68 y.o. POD# 3 s/p C4-5 ACDF for myelopathy, recovering appropriately     PLAN: -Continue supportive care -Therapies as tolerated -Call w/ questions/concerns.   Camie Pickle, [facility]

## 2024-09-10 NOTE — Progress Notes (Signed)
 Physical Therapy Treatment Patient Details Name: Maria [name] MRN: [id number] DOB: 1956/02/01 Today's Date: 09/10/2024   History of Present Illness Pt is a 68yo female presenting with weakness. 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 Xray knees Chronic tricompartmental degeneration. Underwent ACDF C4-5 9/17. PMH: CHF, chronic pain, depression, DM, GERD, HLD, HTN, MRSA, cervical disc surgery, chronic LE lymphedema.    PT Comments  PT called to pt room for OOB, pt eager to get OOB to recliner. Pt requiring mod-max assist for bed mobility and repeated transfers, pt most limited by LE weakness and LLE pain. Pt with x2 episodes of L knee buckling, requiring PT assist to safely lower her onto both BSC and into recliner. However pt able to progress to stepping today, which is an improvement vs Friday. PT to continue to follow, plan remains appropriate.     If plan is discharge home, recommend the following: Assist for transportation;Two people to help with walking and/or transfers;A lot of help with walking and/or transfers;A lot of help with bathing/dressing/bathroom   Can travel by private vehicle        Equipment Recommendations  Wheelchair (measurements PT);Wheelchair cushion (measurements PT);Hospital bed    Recommendations for Other Services       Precautions / Restrictions Precautions Precautions: Fall;Cervical Recall of Precautions/Restrictions: Intact Required Braces or Orthoses: Cervical Brace Cervical Brace: Soft collar Restrictions Weight Bearing Restrictions Per Provider Order: No     Mobility  Bed Mobility Overal bed mobility: Needs Assistance Bed Mobility: Supine to Sit     Supine to sit: Mod assist, HOB elevated, Used rails     General bed mobility comments: trunk elevation and LE lowering over EOB assist, additional boost assist towards EOB with assist of bed pads    Transfers Overall transfer level: Needs assistance Equipment used:  Rolling walker (2 wheels) Transfers: Sit to/from Stand Sit to Stand: Mod assist   Step pivot transfers: Mod assist, Max assist, From elevated surface       General transfer comment: assist for power up, rise, steadying. Cues for upright posture, pivotal steps bed>BSC towards R and BSC>recliner towards L. Pt with progressively increased flexed posture with L knee buckling    Ambulation/Gait               General Gait Details: nt   Stairs             Wheelchair Mobility     Tilt Bed    Modified Rankin (Stroke Patients Only)       Balance Overall balance assessment: Needs assistance, History of Falls Sitting-balance support: Feet supported Sitting balance-Leahy Scale: Fair     Standing balance support: Reliant on assistive device for balance, During functional activity, Bilateral upper extremity supported Standing balance-Leahy Scale: Poor                              Communication Communication Communication: No apparent difficulties  Cognition Arousal: Alert Behavior During Therapy: WFL for tasks assessed/performed   PT - Cognitive impairments: No apparent impairments                         Following commands: Intact      Cueing Cueing Techniques: Verbal cues  Exercises      General Comments        Pertinent Vitals/Pain Pain Assessment Pain Assessment: Faces Faces Pain Scale: Hurts  even more Pain Location: L>R knee during standing Pain Descriptors / Indicators: Grimacing, Moaning, Guarding Pain Intervention(s): Limited activity within patient's tolerance, Monitored during session, Repositioned    Home Living                          Prior Function            PT Goals (current goals can now be found in the care plan section) Acute Rehab PT Goals Patient Stated Goal: home PT Goal Formulation: With patient Time For Goal Achievement: 09/22/24 Potential to Achieve Goals: Good Progress towards PT  goals: Progressing toward goals    Frequency    Min 2X/week      PT Plan      Co-evaluation              AM-PAC PT 6 Clicks Mobility   Outcome Measure  Help needed turning from your back to your side while in a flat bed without using bedrails?: A Lot Help needed moving from lying on your back to sitting on the side of a flat bed without using bedrails?: A Lot Help needed moving to and from a bed to a chair (including a wheelchair)?: A Lot Help needed standing up from a chair using your arms (e.g., wheelchair or bedside chair)?: A Lot Help needed to walk in hospital room?: Total Help needed climbing 3-5 steps with a railing? : Total 6 Click Score: 10    End of Session Equipment Utilized During Treatment: Gait belt Activity Tolerance: Patient limited by pain Patient left: with call bell/phone within reach;in chair;with chair alarm set Nurse Communication: Mobility status PT Visit Diagnosis: Muscle weakness (generalized) (M62.81);Difficulty in walking, not elsewhere classified (R26.2);Pain Pain - Right/Left: Left Pain - part of body: Knee     Time: 8695-8664 PT Time Calculation (min) (ACUTE ONLY): 31 min  Charges:    $Therapeutic Activity: 23-37 mins PT General Charges $$ ACUTE PT VISIT: 1 Visit                     Johana RAMAN, PT DPT Acute Rehabilitation Services Secure Chat Preferred  Office [phone]    Floye Fesler E Johna 09/10/2024, 2:23 PM

## 2024-09-10 NOTE — Progress Notes (Signed)
 [facility]                                                                               Maria [name], is a 68 y.o. female, DOB - 1956-03-21, FMW:[id number] Admit date - 09/01/2024    Outpatient Primary MD for the patient is Schmerge, Maria NOVAK, NP  LOS - 9  days    Brief summary   68 y.o. 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  Diagnosed with cervical compression myelopathy.  Neuro surgery has evaluated and transferred from [facility] to [facility] for anterior cervical decompression and fusion at C4-5 on 9/18. Post op pain control and therapy eval done, recommending SNF.    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  MRI of the entire spine showing multiple levels of stenosis. NS on board and she underwent cervical decompression and fusion surgery at C4 C5 on 9/18.  Wound vac removed. Pain control and therapy evaluations recommending SNF.    Double vision/ Improved.  MRI brain without any new stroke.  She also reports floaters since 2 months.  Recommend outpatient follow up with ophthalmology.     Hypertension:  Borderline BP earlier this am. Fluid bolus ordered with improvement in bp    Chronic pain syndrome:  On oxycodone  and dilaudid  for pain.  On Robaxin  for spasms. Therapy evaluation done and recommended SNF.     Type 2 DM with hyperglycemia  CBG (last 3)  Recent Labs    09/09/24 2101 09/10/24 0744 09/10/24 1231  GLUCAP 119* 98 144*   Last A1c is 5. Recommend a snack before she goes to bed.  On Mounjaro at home.     Chronic diastolic CHF She  appears compensated. On BB, spironolactone  on hold.  Cardiology on board.    Possible UTI Completed the course of IV antibiotics.    Bilateral knee pain. More in the left knee.  Physical therapy eval recommending SNF. Outpatient follow up with orthopedics.     AKI Resolved.    Body mass index is 54.54 kg/m. Morbid Obesity.     Estimated body mass index is 54.54 kg/m as calculated from the following:   Height as of this encounter: 5' 3 (1.6 m).   Weight as of this encounter: 139.7 kg.  Code Status: full code DVT Prophylaxis:  SCD's Start: 09/07/24 2005   Level of Care: Level of care: Med-Surg Family Communication: none at bedside.  Disposition Plan:     Remains inpatient appropriate:  SNF when bed available.   Procedures:  Cervical fusion surgery.   Consultants:   Cardiology neurosurgery  Antimicrobials:   Anti-infectives (From admission, onward)    Start     Dose/Rate Route Frequency Ordered Stop   09/08/24 0400  vancomycin  (VANCOCIN ) IVPB 1000 mg/200 mL premix  1,000 mg 200 mL/hr over 60 Minutes Intravenous  Once 09/07/24 2004 09/08/24 0414   09/07/24 1500  vancomycin  (VANCOREADY) IVPB 1500 mg/300 mL        1,500 mg 150 mL/hr over 120 Minutes Intravenous On call to O.R. 09/07/24 1447 09/07/24 1728   09/05/24 0900  cefTRIAXone  (ROCEPHIN ) 1 g in sodium chloride  0.9 % 100 mL IVPB        1 g 200 mL/hr over 30 Minutes Intravenous Every 24 hours 09/04/24 1013 09/06/24 1009   09/02/24 0900  cefTRIAXone  (ROCEPHIN ) 1 g in sodium chloride  0.9 % 100 mL IVPB        1 g 200 mL/hr over 30 Minutes Intravenous Every 24 hours 09/01/24 1102 09/04/24 1000   09/01/24 0900  cefTRIAXone  (ROCEPHIN ) 1 g in sodium chloride  0.9 % 100 mL IVPB        1 g 200 mL/hr over 30 Minutes Intravenous  Once 09/01/24 0852 09/01/24 0948        Medications  Scheduled Meds:  cholecalciferol   1,000 Units Oral Daily   clonazePAM   1 mg Oral QHS   cyanocobalamin   1,000 mcg Oral  Daily   famotidine   40 mg Oral QHS   feeding supplement  237 mL Oral BID BM   fluticasone   1 spray Each Nare Daily   insulin  aspart  0-9 Units Subcutaneous TID WC   metoprolol  succinate  25 mg Oral Daily   nystatin   1 Application Topical Daily   oxyCODONE -acetaminophen   1 tablet Oral Q8H   And   oxyCODONE   2.5 mg Oral Q8H   pantoprazole   40 mg Oral Daily   polyethylene glycol  17 g Oral Daily   pramipexole   0.5 mg Oral TID   senna-docusate  2 tablet Oral BID   sodium chloride  flush  3 mL Intravenous Q12H   Continuous Infusions:   PRN Meds:.acetaminophen  **OR** acetaminophen , albuterol , ALPRAZolam , HYDROmorphone  (DILAUDID ) injection, menthol  **OR** phenol, methocarbamol , ondansetron  **OR** ondansetron  (ZOFRAN ) IV, oxyCODONE , sodium chloride  flush, traZODone     Subjective:   Maria [name] was seen and examined today. Floaters have improved.   Objective:   Vitals:   09/10/24 0747 09/10/24 0859 09/10/24 1233 09/10/24 1406  BP: (!) 130/51 (!) 120/45 (!) 111/40 (!) 124/56  Pulse: 68 67 65   Resp: 14  12   Temp: (!) 97.4 F (36.3 C)  98.6 F (37 C)   TempSrc: Oral  Oral   SpO2: 98%  96%   Weight:      Height:        Intake/Output Summary (Last 24 hours) at 09/10/2024 1430 Last data filed at 09/10/2024 1130 Gross per 24 hour  Intake 13 ml  Output 925 ml  Net -912 ml   Filed Weights   09/03/24 0921 09/06/24 1253  Weight: (!) 140.2 kg (!) 139.7 kg     Exam General exam: Appears calm and comfortable  Respiratory system: Clear to auscultation. Respiratory effort normal. Cardiovascular system: S1 & S2 heard, RRR. No JVD,  Gastrointestinal system: Abdomen is nondistended, soft and nontender.  Central nervous system: Alert and oriented. No focal neurological deficits. Extremities: Symmetric 5 x 5 power. Skin: No rashes, Psychiatry:Mood & affect appropriate.     Data Reviewed:  I have personally reviewed following labs and imaging studies   CBC Lab Results   Component Value Date   WBC 8.4 09/10/2024   RBC 3.88 09/10/2024   HGB 11.3 (L) 09/10/2024   HCT 34.8 (L) 09/10/2024   MCV 89.7  09/10/2024   MCH 29.1 09/10/2024   PLT 237 09/10/2024   MCHC 32.5 09/10/2024   RDW 13.0 09/10/2024   LYMPHSABS 2.8 09/10/2024   MONOABS 0.8 09/10/2024   EOSABS 0.2 09/10/2024   BASOSABS 0.0 09/10/2024     Last metabolic panel Lab Results  Component Value Date   NA 137 09/10/2024   K 4.1 09/10/2024   CL 102 09/10/2024   CO2 27 09/10/2024   BUN 18 09/10/2024   CREATININE 0.91 09/10/2024   GLUCOSE 98 09/10/2024   GFRNONAA >60 09/10/2024   GFRAA >60 08/06/2020   CALCIUM  8.6 (L) 09/10/2024   PHOS 4.2 02/08/2023   PROT 6.0 (L) 09/03/2024   ALBUMIN  3.5 09/03/2024   BILITOT 0.9 09/03/2024   ALKPHOS 69 09/03/2024   AST 13 (L) 09/03/2024   ALT 7 09/03/2024   ANIONGAP 8 09/10/2024    CBG (last 3)  Recent Labs    09/09/24 2101 09/10/24 0744 09/10/24 1231  GLUCAP 119* 98 144*      Coagulation Profile: No results for input(s): INR, PROTIME in the last 168 hours.   Radiology Studies: MR BRAIN WO CONTRAST Result Date: 09/09/2024 CLINICAL DATA:  68 year old female with double vision since 09/07/2024. postoperative day 2 status post revision/extension of cervical spine ACDF. EXAM: MRI HEAD WITHOUT CONTRAST TECHNIQUE: Multiplanar, multiecho pulse sequences of the brain and surrounding structures were obtained without intravenous contrast. COMPARISON:  Total spine MRI 09/02/2024.  Brain MRI 07/24/2024. FINDINGS: Brain: Stable cerebral volume, normal for age. No restricted diffusion to suggest acute infarction. No midline shift, mass effect, evidence of mass lesion, ventriculomegaly, extra-axial collection or acute intracranial hemorrhage. Cervicomedullary junction and pituitary are within normal limits. Mild for age scattered cerebral white matter T2 and FLAIR hyperintensity in both hemispheres appears stable from last month. No cortical  encephalomalacia. No chronic cerebral blood products identified. Deep gray nuclei brainstem and cerebellum appear stable and within normal limits. Vascular: Major intracranial vascular flow voids are stable. Skull and upper cervical spine: Partially visible cervical spine fusion hardware artifact. Normal background bone marrow signal. Stable skull. Sinuses/Orbits: Normal suprasellar cistern. Negative visible noncontrast cavernous sinus. Globes and orbits soft tissues appears stable and negative. Paranasal sinuses and mastoids are stable and well aerated. Other: Visible internal auditory structures appear normal. Stable visible scalp and face. IMPRESSION: No acute intracranial abnormality. Stable MRI appearance of the brain since last month with mild for age nonspecific white matter signal changes. Electronically Signed   By: VEAR Hurst M.D.   On: 09/09/2024 12:26       Elgie Butter M.D. [facility] 09/10/2024, 2:30 PM  Available via Epic secure chat 7am-7pm After 7 pm, please refer to night coverage provider listed on amion.

## 2024-09-10 NOTE — Progress Notes (Signed)
 MD notified on BP and MAP. See new orders.   09/10/24 1233  Vitals  Temp 98.6 F (37 C)  Temp Source Oral  BP (!) 111/40  MAP (mmHg) (!) 59  BP Location Left Arm  BP Method Automatic  Patient Position (if appropriate) Lying  Pulse Rate 65  Pulse Rate Source Monitor  Resp 12  MEWS COLOR  MEWS Score Color Green  Oxygen Therapy  SpO2 96 %  O2 Device Room Air  MEWS Score  MEWS Temp 0  MEWS Systolic 0  MEWS Pulse 0  MEWS RR 1  MEWS LOC 0  MEWS Score 1  Provider Notification  Provider Name/Title Elgie Butter  Date Provider Notified 09/10/24  Time Provider Notified 1240  Method of Notification Page  Notification Reason Other (Comment) (BP and MAP)  Provider response See new orders  Date of Provider Response 09/10/24  Time of Provider Response 1245

## 2024-09-11 ENCOUNTER — Inpatient Hospital Stay (HOSPITAL_COMMUNITY)

## 2024-09-11 DIAGNOSIS — N3000 Acute cystitis without hematuria: Secondary | ICD-10-CM | POA: Diagnosis not present

## 2024-09-11 DIAGNOSIS — I5032 Chronic diastolic (congestive) heart failure: Secondary | ICD-10-CM | POA: Diagnosis not present

## 2024-09-11 DIAGNOSIS — M4802 Spinal stenosis, cervical region: Secondary | ICD-10-CM | POA: Diagnosis not present

## 2024-09-11 LAB — GLUCOSE, CAPILLARY
Glucose-Capillary: 129 mg/dL — ABNORMAL HIGH (ref 70–99)
Glucose-Capillary: 102 mg/dL — ABNORMAL HIGH (ref 70–99)
Glucose-Capillary: 98 mg/dL (ref 70–99)
Glucose-Capillary: 130 mg/dL — ABNORMAL HIGH (ref 70–99)
Glucose-Capillary: 139 mg/dL — ABNORMAL HIGH (ref 70–99)
Glucose-Capillary: 108 mg/dL — ABNORMAL HIGH (ref 70–99)

## 2024-09-11 MED ORDER — DICLOFENAC SODIUM 1 % EX GEL
4.0000 g | Freq: Four times a day (QID) | CUTANEOUS | Status: DC
  Administered 2024-09-11 – 2024-09-13 (×8): 4 g via TOPICAL
  Filled 2024-09-11: qty 100

## 2024-09-11 MED ORDER — OXYCODONE HCL 5 MG PO TABS
5.0000 mg | ORAL_TABLET | ORAL | Status: DC | PRN
  Administered 2024-09-11 – 2024-09-12 (×4): 10 mg via ORAL
  Filled 2024-09-11 (×4): qty 2

## 2024-09-11 NOTE — TOC Progression Note (Signed)
 Transition of Care [facility]) - Progression Note    Patient Details  Name: Maria [name] MRN: [id number] Date of Birth: [birth year]  [facility]) CM/SW Contact  Montie LOISE Louder, [place] Phone Number: 09/11/2024, 2:06 PM  Clinical Narrative:     CSW met with patient at beside. Patient confirmed she remains agreeable to SNF/[facility].   [facility] confirmed bed availability -  TOC will start insurance authorization today.   Montie Louder, MSW, LCSW Clinical Social Worker  Expected Discharge Plan: Skilled Nursing Facility Barriers to Discharge: Insurance Authorization               Expected Discharge Plan and Services In-house Referral: NA Discharge Planning Services: CM Consult   Living arrangements for the past 2 months: Apartment                 DME Arranged: N/A DME Agency: NA       HH Arranged: NA HH Agency: NA         Social Drivers of Health (SDOH) Interventions SDOH Screenings   Food Insecurity: No Food Insecurity (09/01/2024)  Housing: Low Risk  (09/01/2024)  Transportation Needs: No Transportation Needs (09/01/2024)  Utilities: Not At Risk (09/01/2024)  Alcohol  Screen: Low Risk  (12/30/2021)  Depression (PHQ2-9): Medium Risk (01/30/2022)  Financial Resource Strain: Medium Risk (12/30/2021)  Physical Activity: Inactive (12/30/2021)  Social Connections: Socially Isolated (09/01/2024)  Stress: No Stress Concern Present (12/30/2021)  Tobacco Use: Medium Risk (09/07/2024)    Readmission Risk Interventions    09/04/2024    4:45 PM 05/03/2024   10:25 AM 04/30/2024    4:03 PM  Readmission Risk Prevention Plan  Transportation Screening Complete Complete Complete  Medication Review Oceanographer) Complete Complete Complete  PCP or Specialist appointment within 3-5 days of discharge Complete Complete Complete  HRI or Home Care Consult Complete Complete Complete  SW Recovery Care/Counseling Consult Complete Complete Complete  Palliative  Care Screening Not Applicable Not Applicable Not Applicable  Skilled Nursing Facility Complete Not Applicable

## 2024-09-11 NOTE — TOC Progression Note (Signed)
 Transition of Care [facility]) - Progression Note    Patient Details  Name: Maria [name] MRN: [id number] Date of Birth: Jun 12, 1956  [facility]) CM/SW Contact  Montie LOISE Louder, [place] Phone Number: 09/11/2024, 3:26 PM  Clinical Narrative:     Ms Hansman is approved  for SNF/ [facility]. 9/22 - 9/24  Patient requested a private room- SNF will not have private room until Wednesday but they can admit patient  tomorrow and move to private room on Wednesday.   TOC will continue to follow and assist with discharge planning.   Montie Louder, MSW, LCSW Clinical Social Worker    Expected Discharge Plan: Skilled Nursing Facility Barriers to Discharge: Insurance Authorization               Expected Discharge Plan and Services In-house Referral: NA Discharge Planning Services: CM Consult   Living arrangements for the past 2 months: Apartment                 DME Arranged: N/A DME Agency: NA       HH Arranged: NA HH Agency: NA         Social Drivers of Health (SDOH) Interventions SDOH Screenings   Food Insecurity: No Food Insecurity (09/01/2024)  Housing: Low Risk  (09/01/2024)  Transportation Needs: No Transportation Needs (09/01/2024)  Utilities: Not At Risk (09/01/2024)  Alcohol  Screen: Low Risk  (12/30/2021)  Depression (PHQ2-9): Medium Risk (01/30/2022)  Financial Resource Strain: Medium Risk (12/30/2021)  Physical Activity: Inactive (12/30/2021)  Social Connections: Socially Isolated (09/01/2024)  Stress: No Stress Concern Present (12/30/2021)  Tobacco Use: Medium Risk (09/07/2024)    Readmission Risk Interventions    09/04/2024    4:45 PM 05/03/2024   10:25 AM 04/30/2024    4:03 PM  Readmission Risk Prevention Plan  Transportation Screening Complete Complete Complete  Medication Review Oceanographer) Complete Complete Complete  PCP or Specialist appointment within 3-5 days of discharge Complete Complete Complete  HRI or Home Care Consult  Complete Complete Complete  SW Recovery Care/Counseling Consult Complete Complete Complete  Palliative Care Screening Not Applicable Not Applicable Not Applicable  Skilled Nursing Facility Complete Not Applicable

## 2024-09-11 NOTE — Progress Notes (Signed)
 [facility]                                                                               Kalleigh Jarvie, is a 68 y.o. female, DOB - [birth year], FMW:[id number] Admit date - 09/01/2024    Outpatient Primary MD for the patient is Schmerge, [name], NP  LOS - 10  days    Brief summary   68 y.o. 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  Diagnosed with cervical compression myelopathy.  Neuro surgery has evaluated and transferred from [facility] to [facility] for anterior cervical decompression and fusion at C4-5 on 9/18. Post op pain control and therapy eval done, recommending SNF.    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  MRI of the entire spine showing multiple levels of stenosis. NS on board and she underwent cervical decompression and fusion surgery at C4 C5 on 9/18.  Wound vac removed. Pain control with oxycodone  and therapy evaluations recommending SNF.    Double vision/ Improved.  MRI brain without any new stroke.  She also reports floaters since 2 months.  Recommend outpatient follow up with ophthalmology.     Hypertension:  Borderline BP earlier this am. Fluid bolus ordered with improvement in bp    Chronic pain syndrome:  On oxycodone  and dilaudid  for pain.  On Robaxin  for spasms. Therapy evaluation done and recommended SNF.     Type 2 DM with hyperglycemia  CBG (last 3)  Recent Labs    09/11/24 0758 09/11/24 1137 09/11/24 1655  GLUCAP 102* 98 130*   Last A1c is 5. Recommend a snack before she goes to bed.  On Mounjaro at home.     Chronic  diastolic CHF She appears compensated. On BB, spironolactone  on hold.  Cardiology on board.    Possible UTI Completed the course of IV antibiotics.    Bilateral knee pain. More in the left knee.  Physical therapy eval recommending SNF. Outpatient follow up with orthopedics.  Checking x rays of the knee today.     AKI Resolved.    Body mass index is 54.54 kg/m. Morbid Obesity.     Estimated body mass index is 54.54 kg/m as calculated from the following:   Height as of this encounter: 5' 3 (1.6 m).   Weight as of this encounter: 139.7 kg.  Code Status: full code DVT Prophylaxis:  SCD's Start: 09/07/24 2005   Level of Care: Level of care: Med-Surg Family Communication: none at bedside.  Disposition Plan:     Remains inpatient appropriate:  SNF when bed available.   Procedures:  Cervical fusion surgery.   Consultants:   Cardiology neurosurgery  Antimicrobials:   Anti-infectives (From admission, onward)    Start     Dose/Rate Route Frequency Ordered Stop   09/08/24 0400  vancomycin  (VANCOCIN )  IVPB 1000 mg/200 mL premix        1,000 mg 200 mL/hr over 60 Minutes Intravenous  Once 09/07/24 2004 09/08/24 0414   09/07/24 1500  vancomycin  (VANCOREADY) IVPB 1500 mg/300 mL        1,500 mg 150 mL/hr over 120 Minutes Intravenous On call to O.R. 09/07/24 1447 09/07/24 1728   09/05/24 0900  cefTRIAXone  (ROCEPHIN ) 1 g in sodium chloride  0.9 % 100 mL IVPB        1 g 200 mL/hr over 30 Minutes Intravenous Every 24 hours 09/04/24 1013 09/06/24 1009   09/02/24 0900  cefTRIAXone  (ROCEPHIN ) 1 g in sodium chloride  0.9 % 100 mL IVPB        1 g 200 mL/hr over 30 Minutes Intravenous Every 24 hours 09/01/24 1102 09/04/24 1000   09/01/24 0900  cefTRIAXone  (ROCEPHIN ) 1 g in sodium chloride  0.9 % 100 mL IVPB        1 g 200 mL/hr over 30 Minutes Intravenous  Once 09/01/24 0852 09/01/24 0948        Medications  Scheduled Meds:  cholecalciferol   1,000 Units Oral Daily    clonazePAM   1 mg Oral QHS   cyanocobalamin   1,000 mcg Oral Daily   diclofenac  Sodium  4 g Topical QID   famotidine   40 mg Oral QHS   feeding supplement  237 mL Oral BID BM   fluticasone   1 spray Each Nare Daily   insulin  aspart  0-9 Units Subcutaneous TID WC   metoprolol  succinate  25 mg Oral Daily   nystatin   1 Application Topical Daily   pantoprazole   40 mg Oral Daily   polyethylene glycol  17 g Oral Daily   pramipexole   0.5 mg Oral TID   senna-docusate  2 tablet Oral BID   sodium chloride  flush  3 mL Intravenous Q12H   Continuous Infusions:   PRN Meds:.acetaminophen  **OR** acetaminophen , albuterol , ALPRAZolam , HYDROmorphone  (DILAUDID ) injection, menthol  **OR** phenol, methocarbamol , ondansetron  **OR** ondansetron  (ZOFRAN ) IV, oxyCODONE , sodium chloride  flush, traZODone     Subjective:   Freeda Nordling was seen and examined today. No new complaints.   Objective:   Vitals:   09/11/24 0320 09/11/24 0759 09/11/24 1138 09/11/24 1702  BP:  (!) 116/50 (!) 128/48 (!) 146/53  Pulse: 70 72 70 75  Resp: 18 18 19 18   Temp: 97.6 F (36.4 C) 97.6 F (36.4 C) 97.7 F (36.5 C) 97.7 F (36.5 C)  TempSrc: Oral Oral Oral Oral  SpO2: 98% 96% 96% 96%  Weight:      Height:        Intake/Output Summary (Last 24 hours) at 09/11/2024 1922 Last data filed at 09/11/2024 9362 Gross per 24 hour  Intake 483 ml  Output 1600 ml  Net -1117 ml   Filed Weights   09/03/24 0921 09/06/24 1253  Weight: (!) 140.2 kg (!) 139.7 kg     Exam General exam: Appears calm and comfortable  Respiratory system: Clear to auscultation. Respiratory effort normal. Cardiovascular system: S1 & S2 heard, RRR. Gastrointestinal system: Abdomen is nondistended, soft and nontender.  Central nervous system: Alert and oriented.  Extremities: LEFT knee pain and tenderness. Skin: No rashes,  Psychiatry:Mood & affect appropriate.      Data Reviewed:  I have personally reviewed following labs and imaging  studies   CBC Lab Results  Component Value Date   WBC 8.4 09/10/2024   RBC 3.88 09/10/2024   HGB 11.3 (L) 09/10/2024   HCT 34.8 (L) 09/10/2024  MCV 89.7 09/10/2024   MCH 29.1 09/10/2024   PLT 237 09/10/2024   MCHC 32.5 09/10/2024   RDW 13.0 09/10/2024   LYMPHSABS 2.8 09/10/2024   MONOABS 0.8 09/10/2024   EOSABS 0.2 09/10/2024   BASOSABS 0.0 09/10/2024     Last metabolic panel Lab Results  Component Value Date   NA 137 09/10/2024   K 4.1 09/10/2024   CL 102 09/10/2024   CO2 27 09/10/2024   BUN 18 09/10/2024   CREATININE 0.91 09/10/2024   GLUCOSE 98 09/10/2024   GFRNONAA >60 09/10/2024   GFRAA >60 08/06/2020   CALCIUM  8.6 (L) 09/10/2024   PHOS 4.2 02/08/2023   PROT 6.0 (L) 09/03/2024   ALBUMIN  3.5 09/03/2024   BILITOT 0.9 09/03/2024   ALKPHOS 69 09/03/2024   AST 13 (L) 09/03/2024   ALT 7 09/03/2024   ANIONGAP 8 09/10/2024    CBG (last 3)  Recent Labs    09/11/24 0758 09/11/24 1137 09/11/24 1655  GLUCAP 102* 98 130*      Coagulation Profile: No results for input(s): INR, PROTIME in the last 168 hours.   Radiology Studies: DG Knee Complete 4 Views Left Result Date: 09/11/2024 EXAM: 4 or more VIEW(S) XRAY OF THE LEFT KNEE 09/11/2024 02:59:00 PM COMPARISON: 09/01/2024 CLINICAL HISTORY: Knee pain, acute FINDINGS: BONES AND JOINTS: No acute fracture. No focal osseous lesion. No significant joint effusion. Severe tricompartmental osteoarthritis with near complete joint space loss, prominent subchondral sclerosis, and large marginal osteophyte formation. Large marginal osteophytes in the medial compartment with near complete joint space loss. SOFT TISSUES: The soft tissues are unremarkable. IMPRESSION: 1. Severe tricompartmental osteoarthritis. No acute findings or change from prior. Electronically signed by: Andrea Gasman MD 09/11/2024 06:01 PM EDT RP Workstation: [id number]       Elgie Butter M.D. [facility] 09/11/2024, 7:22 PM  Available  via Epic secure chat 7am-7pm After 7 pm, please refer to night coverage provider listed on amion.

## 2024-09-11 NOTE — Progress Notes (Signed)
   Providing Compassionate, Quality Care - Together   Subjective: Patient reports some neck pain and a sore throat.  Objective: Vital signs in last 24 hours: Temp:  [97.5 F (36.4 C)-98.6 F (37 C)] 97.6 F (36.4 C) (09/22 0759) Pulse Rate:  [65-76] 72 (09/22 0759) Resp:  [12-18] 18 (09/22 0759) BP: (111-145)/(40-62) 116/50 (09/22 0759) SpO2:  [94 %-98 %] 98 % (09/22 0320)  Intake/Output from previous day: 09/21 0701 - 09/22 0700 In: 493 [P.O.:480; I.V.:13] Out: 1615 [Urine:1600; Drains:15] Intake/Output this shift: No intake/output data recorded.  Alert and oriented Speech clear MAE 5/5 BUE, generalized weakness BLE Incision covered with steri strips. Site is clean, dry, and intact  Lab Results: Recent Labs    09/10/24 0443  WBC 8.4  HGB 11.3*  HCT 34.8*  PLT 237   BMET Recent Labs    09/10/24 0443  NA 137  K 4.1  CL 102  CO2 27  GLUCOSE 98  BUN 18  CREATININE 0.91  CALCIUM  8.6*    Studies/Results: MR BRAIN WO CONTRAST Result Date: 09/09/2024 CLINICAL DATA:  68 year old female with double vision since 09/07/2024. postoperative day 2 status post revision/extension of cervical spine ACDF. EXAM: MRI HEAD WITHOUT CONTRAST TECHNIQUE: Multiplanar, multiecho pulse sequences of the brain and surrounding structures were obtained without intravenous contrast. COMPARISON:  Total spine MRI 09/02/2024.  Brain MRI 07/24/2024. FINDINGS: Brain: Stable cerebral volume, normal for age. No restricted diffusion to suggest acute infarction. No midline shift, mass effect, evidence of mass lesion, ventriculomegaly, extra-axial collection or acute intracranial hemorrhage. Cervicomedullary junction and pituitary are within normal limits. Mild for age scattered cerebral white matter T2 and FLAIR hyperintensity in both hemispheres appears stable from last month. No cortical encephalomalacia. No chronic cerebral blood products identified. Deep gray nuclei brainstem and cerebellum appear  stable and within normal limits. Vascular: Major intracranial vascular flow voids are stable. Skull and upper cervical spine: Partially visible cervical spine fusion hardware artifact. Normal background bone marrow signal. Stable skull. Sinuses/Orbits: Normal suprasellar cistern. Negative visible noncontrast cavernous sinus. Globes and orbits soft tissues appears stable and negative. Paranasal sinuses and mastoids are stable and well aerated. Other: Visible internal auditory structures appear normal. Stable visible scalp and face. IMPRESSION: No acute intracranial abnormality. Stable MRI appearance of the brain since last month with mild for age nonspecific white matter signal changes. Electronically Signed   By: VEAR Hurst M.D.   On: 09/09/2024 12:26    Assessment/Plan: Patient underwent C4-5 ACDF with removal of old plate at R4-2 by Dr. Louis on 09/07/2024. Hemovac drain removed 09/10/2024.   LOS: 10 days   -Encourage patient to mobilize. -Plan is for SNF at discharge.  I am in communication with my attending and they agree with the plan for this patient.   Gerard Beck, DNP, AGNP-C Nurse Practitioner  [facility] [address], [address], [place], [place] [address] P: [phone]    F: [phone]  09/11/2024, 10:39 AM

## 2024-09-11 NOTE — Plan of Care (Signed)
 Problem: Education: Goal: Knowledge of General Education information will improve Description: Including pain rating scale, medication(s)/side effects and non-pharmacologic comfort measures 09/11/2024 0022 by Marvis Kenneth SAILOR, RN Outcome: Progressing 09/10/2024 2037 by Marvis Kenneth SAILOR, RN Outcome: Progressing   Problem: Health Behavior/Discharge Planning: Goal: Ability to manage health-related needs will improve 09/11/2024 0022 by Marvis Kenneth SAILOR, RN Outcome: Progressing 09/10/2024 2037 by Marvis Kenneth SAILOR, RN Outcome: Progressing   Problem: Clinical Measurements: Goal: Ability to maintain clinical measurements within normal limits will improve 09/11/2024 0022 by Marvis Kenneth SAILOR, RN Outcome: Progressing 09/10/2024 2037 by Marvis Kenneth SAILOR, RN Outcome: Progressing Goal: Will remain free from infection 09/11/2024 0022 by Marvis Kenneth SAILOR, RN Outcome: Progressing 09/10/2024 2037 by Marvis Kenneth SAILOR, RN Outcome: Progressing Goal: Diagnostic test results will improve 09/11/2024 0022 by Marvis Kenneth SAILOR, RN Outcome: Progressing 09/10/2024 2037 by Marvis Kenneth SAILOR, RN Outcome: Progressing Goal: Respiratory complications will improve 09/11/2024 0022 by Marvis Kenneth SAILOR, RN Outcome: Progressing 09/10/2024 2037 by Marvis Kenneth SAILOR, RN Outcome: Progressing Goal: Cardiovascular complication will be avoided 09/11/2024 0022 by Marvis Kenneth SAILOR, RN Outcome: Progressing 09/10/2024 2037 by Marvis Kenneth SAILOR, RN Outcome: Progressing   Problem: Activity: Goal: Risk for activity intolerance will decrease 09/11/2024 0022 by Marvis Kenneth SAILOR, RN Outcome: Progressing 09/10/2024 2037 by Marvis Kenneth SAILOR, RN Outcome: Progressing   Problem: Nutrition: Goal: Adequate nutrition will be maintained 09/11/2024 0022 by Marvis Kenneth SAILOR, RN Outcome: Progressing 09/10/2024 2037 by Marvis Kenneth SAILOR, RN Outcome: Progressing   Problem: Coping: Goal: Level of anxiety will  decrease 09/11/2024 0022 by Marvis Kenneth SAILOR, RN Outcome: Progressing 09/10/2024 2037 by Marvis Kenneth SAILOR, RN Outcome: Progressing   Problem: Elimination: Goal: Will not experience complications related to bowel motility 09/11/2024 0022 by Marvis Kenneth SAILOR, RN Outcome: Progressing 09/10/2024 2037 by Marvis Kenneth SAILOR, RN Outcome: Progressing Goal: Will not experience complications related to urinary retention 09/11/2024 0022 by Marvis Kenneth SAILOR, RN Outcome: Progressing 09/10/2024 2037 by Marvis Kenneth SAILOR, RN Outcome: Progressing   Problem: Pain Managment: Goal: General experience of comfort will improve and/or be controlled 09/11/2024 0022 by Marvis Kenneth SAILOR, RN Outcome: Progressing 09/10/2024 2037 by Marvis Kenneth SAILOR, RN Outcome: Progressing   Problem: Safety: Goal: Ability to remain free from injury will improve 09/11/2024 0022 by Marvis Kenneth SAILOR, RN Outcome: Progressing 09/10/2024 2037 by Marvis Kenneth SAILOR, RN Outcome: Progressing   Problem: Skin Integrity: Goal: Risk for impaired skin integrity will decrease 09/11/2024 0022 by Marvis Kenneth SAILOR, RN Outcome: Progressing 09/10/2024 2037 by Marvis Kenneth SAILOR, RN Outcome: Progressing   Problem: Education: Goal: Ability to describe self-care measures that may prevent or decrease complications (Diabetes Survival Skills Education) will improve 09/11/2024 0022 by Marvis Kenneth SAILOR, RN Outcome: Progressing 09/10/2024 2037 by Marvis Kenneth SAILOR, RN Outcome: Progressing Goal: Individualized Educational Video(s) 09/11/2024 0022 by Marvis Kenneth SAILOR, RN Outcome: Progressing 09/10/2024 2037 by Marvis Kenneth SAILOR, RN Outcome: Progressing   Problem: Coping: Goal: Ability to adjust to condition or change in health will improve 09/11/2024 0022 by Marvis Kenneth SAILOR, RN Outcome: Progressing 09/10/2024 2037 by Marvis Kenneth SAILOR, RN Outcome: Progressing   Problem: Fluid Volume: Goal: Ability to maintain a balanced  intake and output will improve 09/11/2024 0022 by Marvis Kenneth SAILOR, RN Outcome: Progressing 09/10/2024 2037 by Marvis Kenneth SAILOR, RN Outcome: Progressing   Problem: Health Behavior/Discharge Planning: Goal: Ability to identify and utilize available resources and services will improve 09/11/2024 0022 by Marvis Kenneth SAILOR, RN Outcome: Progressing 09/10/2024 2037 by  Marvis Kenneth SAILOR, RN Outcome: Progressing Goal: Ability to manage health-related needs will improve 09/11/2024 0022 by Marvis Kenneth SAILOR, RN Outcome: Progressing 09/10/2024 2037 by Marvis Kenneth SAILOR, RN Outcome: Progressing   Problem: Metabolic: Goal: Ability to maintain appropriate glucose levels will improve 09/11/2024 0022 by Marvis Kenneth SAILOR, RN Outcome: Progressing 09/10/2024 2037 by Marvis Kenneth SAILOR, RN Outcome: Progressing   Problem: Nutritional: Goal: Maintenance of adequate nutrition will improve 09/11/2024 0022 by Marvis Kenneth SAILOR, RN Outcome: Progressing 09/10/2024 2037 by Marvis Kenneth SAILOR, RN Outcome: Progressing Goal: Progress toward achieving an optimal weight will improve 09/11/2024 0022 by Marvis Kenneth SAILOR, RN Outcome: Progressing 09/10/2024 2037 by Marvis Kenneth SAILOR, RN Outcome: Progressing   Problem: Skin Integrity: Goal: Risk for impaired skin integrity will decrease 09/11/2024 0022 by Marvis Kenneth SAILOR, RN Outcome: Progressing 09/10/2024 2037 by Marvis Kenneth SAILOR, RN Outcome: Progressing   Problem: Tissue Perfusion: Goal: Adequacy of tissue perfusion will improve 09/11/2024 0022 by Marvis Kenneth SAILOR, RN Outcome: Progressing 09/10/2024 2037 by Marvis Kenneth SAILOR, RN Outcome: Progressing   Problem: Education: Goal: Ability to verbalize activity precautions or restrictions will improve 09/11/2024 0022 by Marvis Kenneth SAILOR, RN Outcome: Progressing 09/10/2024 2037 by Marvis Kenneth SAILOR, RN Outcome: Progressing Goal: Knowledge of the prescribed therapeutic regimen will  improve 09/11/2024 0022 by Marvis Kenneth SAILOR, RN Outcome: Progressing 09/10/2024 2037 by Marvis Kenneth SAILOR, RN Outcome: Progressing Goal: Understanding of discharge needs will improve 09/11/2024 0022 by Marvis Kenneth SAILOR, RN Outcome: Progressing 09/10/2024 2037 by Marvis Kenneth SAILOR, RN Outcome: Progressing   Problem: Activity: Goal: Ability to avoid complications of mobility impairment will improve 09/11/2024 0022 by Marvis Kenneth SAILOR, RN Outcome: Progressing 09/10/2024 2037 by Marvis Kenneth SAILOR, RN Outcome: Progressing Goal: Ability to tolerate increased activity will improve 09/11/2024 0022 by Marvis Kenneth SAILOR, RN Outcome: Progressing 09/10/2024 2037 by Marvis Kenneth SAILOR, RN Outcome: Progressing Goal: Will remain free from falls 09/11/2024 0022 by Marvis Kenneth SAILOR, RN Outcome: Progressing 09/10/2024 2037 by Marvis Kenneth SAILOR, RN Outcome: Progressing   Problem: Bowel/Gastric: Goal: Gastrointestinal status for postoperative course will improve 09/11/2024 0022 by Marvis Kenneth SAILOR, RN Outcome: Progressing 09/10/2024 2037 by Marvis Kenneth SAILOR, RN Outcome: Progressing   Problem: Clinical Measurements: Goal: Ability to maintain clinical measurements within normal limits will improve 09/11/2024 0022 by Marvis Kenneth SAILOR, RN Outcome: Progressing 09/10/2024 2037 by Marvis Kenneth SAILOR, RN Outcome: Progressing Goal: Postoperative complications will be avoided or minimized 09/11/2024 0022 by Marvis Kenneth SAILOR, RN Outcome: Progressing 09/10/2024 2037 by Marvis Kenneth SAILOR, RN Outcome: Progressing Goal: Diagnostic test results will improve 09/11/2024 0022 by Marvis Kenneth SAILOR, RN Outcome: Progressing 09/10/2024 2037 by Marvis Kenneth SAILOR, RN Outcome: Progressing   Problem: Pain Management: Goal: Pain level will decrease 09/11/2024 0022 by Marvis Kenneth SAILOR, RN Outcome: Progressing 09/10/2024 2037 by Marvis Kenneth SAILOR, RN Outcome: Progressing   Problem: Skin  Integrity: Goal: Will show signs of wound healing 09/11/2024 0022 by Marvis Kenneth SAILOR, RN Outcome: Progressing 09/10/2024 2037 by Marvis Kenneth SAILOR, RN Outcome: Progressing   Problem: Health Behavior/Discharge Planning: Goal: Identification of resources available to assist in meeting health care needs will improve 09/11/2024 0022 by Marvis Kenneth SAILOR, RN Outcome: Progressing 09/10/2024 2037 by Marvis Kenneth SAILOR, RN Outcome: Progressing   Problem: Bladder/Genitourinary: Goal: Urinary functional status for postoperative course will improve 09/11/2024 0022 by Marvis Kenneth SAILOR, RN Outcome: Progressing 09/10/2024 2037 by Marvis Kenneth SAILOR, RN Outcome: Progressing

## 2024-09-12 ENCOUNTER — Ambulatory Visit: Payer: Self-pay

## 2024-09-12 ENCOUNTER — Institutional Professional Consult (permissible substitution): Admitting: Psychology

## 2024-09-12 DIAGNOSIS — E1169 Type 2 diabetes mellitus with other specified complication: Secondary | ICD-10-CM

## 2024-09-12 DIAGNOSIS — I10 Essential (primary) hypertension: Secondary | ICD-10-CM | POA: Diagnosis not present

## 2024-09-12 DIAGNOSIS — M4322 Fusion of spine, cervical region: Secondary | ICD-10-CM | POA: Diagnosis not present

## 2024-09-12 DIAGNOSIS — I5032 Chronic diastolic (congestive) heart failure: Secondary | ICD-10-CM | POA: Diagnosis not present

## 2024-09-12 DIAGNOSIS — N3000 Acute cystitis without hematuria: Secondary | ICD-10-CM | POA: Diagnosis not present

## 2024-09-12 LAB — GLUCOSE, CAPILLARY
Glucose-Capillary: 113 mg/dL — ABNORMAL HIGH (ref 70–99)
Glucose-Capillary: 100 mg/dL — ABNORMAL HIGH (ref 70–99)
Glucose-Capillary: 108 mg/dL — ABNORMAL HIGH (ref 70–99)
Glucose-Capillary: 116 mg/dL — ABNORMAL HIGH (ref 70–99)
Glucose-Capillary: 111 mg/dL — ABNORMAL HIGH (ref 70–99)
Glucose-Capillary: 111 mg/dL — ABNORMAL HIGH (ref 70–99)

## 2024-09-12 MED ORDER — TRAZODONE HCL 50 MG PO TABS: 25.0000 mg | ORAL_TABLET | Freq: Every evening | ORAL | 0 refills | Status: AC | PRN

## 2024-09-12 MED ORDER — TORSEMIDE 20 MG PO TABS: 20.0000 mg | ORAL_TABLET | Freq: Every day | ORAL | Status: DC

## 2024-09-12 MED ORDER — OXYCODONE HCL 5 MG PO TABS
10.0000 mg | ORAL_TABLET | ORAL | Status: DC | PRN
Start: 2024-09-12 — End: 2024-09-13
  Administered 2024-09-12 – 2024-09-13 (×5): 10 mg via ORAL
  Filled 2024-09-12 (×6): qty 2

## 2024-09-12 MED ORDER — POLYETHYLENE GLYCOL 3350 17 G PO PACK: 17.0000 g | PACK | Freq: Every day | ORAL | 0 refills | Status: DC

## 2024-09-12 MED ORDER — TORSEMIDE 20 MG PO TABS: 40.0000 mg | ORAL_TABLET | Freq: Every day | ORAL | Status: DC

## 2024-09-12 MED ORDER — METHOCARBAMOL 500 MG PO TABS: 500.0000 mg | ORAL_TABLET | Freq: Four times a day (QID) | ORAL | 0 refills | Status: AC | PRN

## 2024-09-12 MED ORDER — CLONAZEPAM 1 MG PO TABS: 1.0000 mg | ORAL_TABLET | Freq: Two times a day (BID) | ORAL | 0 refills | Status: DC | PRN

## 2024-09-12 MED ORDER — DICLOFENAC SODIUM 1 % EX GEL: 4.0000 g | Freq: Four times a day (QID) | CUTANEOUS | 1 refills | Status: DC

## 2024-09-12 MED ORDER — SENNOSIDES-DOCUSATE SODIUM 8.6-50 MG PO TABS: 2.0000 | ORAL_TABLET | Freq: Two times a day (BID) | ORAL | Status: DC

## 2024-09-12 MED ORDER — ENSURE PLUS HIGH PROTEIN PO LIQD: 237.0000 mL | Freq: Two times a day (BID) | ORAL | 2 refills | Status: AC

## 2024-09-12 MED ORDER — OXYCODONE HCL 10 MG PO TABS: 10.0000 mg | ORAL_TABLET | Freq: Four times a day (QID) | ORAL | 0 refills | Status: AC

## 2024-09-12 NOTE — Progress Notes (Signed)
 Occupational Therapy Treatment Patient Details Name: Maria [name] MRN: [id number] DOB: 1956-11-24 Today's Date: 09/12/2024   History of present illness Pt is a 68yo female presenting with weakness. 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 Xray knees Chronic tricompartmental degeneration. Underwent ACDF C4-5 9/17. PMH: CHF, chronic pain, depression, DM, GERD, HLD, HTN, MRSA, cervical disc surgery, chronic LE lymphedema.   OT comments  Pt eager to return to bed after being up in chair x1 hour. Pt currently needing max-total A for toileting/pericare. Pt min-mod A for bed mobility and max A for transfer from chair due to low surface, mod A for transfer from [facility]. Attempted standing with RW x2, however pt unable to shift weight to bring LUE up on RW handle. Pt presenting with impairments listed below, will follow acutely. Patient will benefit from continued inpatient follow up therapy, <3 hours/day to maximize safety/ind with ADL/functional mobility.       If plan is discharge home, recommend the following:  Two people to help with walking and/or transfers;A lot of help with bathing/dressing/bathroom   Equipment Recommendations  Other (comment) (defer)    Recommendations for Other Services PT consult    Precautions / Restrictions Precautions Precautions: Fall;Cervical Precaution Booklet Issued: Yes (comment) Recall of Precautions/Restrictions: Intact Required Braces or Orthoses: Cervical Brace Cervical Brace: Soft collar Restrictions Weight Bearing Restrictions Per Provider Order: No       Mobility Bed Mobility Overal bed mobility: Needs Assistance Bed Mobility: Rolling, Sit to Sidelying Rolling: Min assist       Sit to sidelying: Mod assist General bed mobility comments: assist for completion of log roll to L, trunk elevation to upright. Pt able to scoot self forward with use of UEs and increased time    Transfers Overall transfer level: Needs  assistance Equipment used: Ambulation equipment used Transfers: Sit to/from Stand, Bed to chair/wheelchair/BSC Sit to Stand: Mod assist, Max assist           General transfer comment: attempted standing from chair x2 with RW, pt unable to shift weight from arm of chair to get LUE onto RW, opted for stedy transfer to [facility] and then to bed Transfer via Lift Equipment: Stedy   Balance Overall balance assessment: Needs assistance, History of Falls Sitting-balance support: Feet supported Sitting balance-Leahy Scale: Fair     Standing balance support: Reliant on assistive device for balance, During functional activity, Bilateral upper extremity supported Standing balance-Leahy Scale: Poor                             ADL either performed or assessed with clinical judgement   ADL Overall ADL's : Needs assistance/impaired                         Toilet Transfer: Maximal assistance;Moderate assistance Toilet Transfer Details (indicate cue type and reason): max A from low chiar surface with [place], mod A from [facility] Toileting- Clothing Manipulation and Hygiene: Total assistance;Sit to/from stand Toileting - Clothing Manipulation Details (indicate cue type and reason): standing in stedy     Functional mobility during ADLs: Moderate assistance      Extremity/Trunk Assessment Upper Extremity Assessment Upper Extremity Assessment: Generalized weakness   Lower Extremity Assessment Lower Extremity Assessment: Defer to PT evaluation        Vision   Vision Assessment?: No apparent visual deficits   Perception  Praxis     Communication Communication Communication: No apparent difficulties   Cognition Arousal: Alert Behavior During Therapy: WFL for tasks assessed/performed, Lability Cognition: No apparent impairments             OT - Cognition Comments: tearful in regard to current level of mobility                 Following commands: Intact         Cueing   Cueing Techniques: Verbal cues  Exercises      Shoulder Instructions       General Comments VSS on RA    Pertinent Vitals/ Pain       Pain Assessment Pain Assessment: Faces Pain Score: 8  Faces Pain Scale: Hurts whole lot Pain Location: L>R knee during standing Pain Descriptors / Indicators: Grimacing, Moaning, Guarding Pain Intervention(s): Limited activity within patient's tolerance, Monitored during session, Repositioned  Home Living                                          Prior Functioning/Environment              Frequency  Min 2X/week        Progress Toward Goals  OT Goals(current goals can now be found in the care plan section)  Progress towards OT goals: Progressing toward goals  Acute Rehab OT Goals Patient Stated Goal: to be able to stand and walk OT Goal Formulation: With patient Time For Goal Achievement: 09/16/24 Potential to Achieve Goals: Good ADL Goals Pt Will Perform Lower Body Dressing: with supervision;sit to/from stand;sitting/lateral leans Pt Will Transfer to Toilet: with supervision;bedside commode Pt Will Perform Toileting - Clothing Manipulation and hygiene: with supervision;sit to/from stand;sitting/lateral leans  Plan      Co-evaluation                 AM-PAC OT 6 Clicks Daily Activity     Outcome Measure   Help from another person eating meals?: A Little Help from another person taking care of personal grooming?: A Little Help from another person toileting, which includes using toliet, bedpan, or urinal?: A Lot Help from another person bathing (including washing, rinsing, drying)?: A Lot Help from another person to put on and taking off regular upper body clothing?: A Little Help from another person to put on and taking off regular lower body clothing?: Total 6 Click Score: 14    End of Session Equipment Utilized During Treatment: Gait belt  OT Visit Diagnosis: Unsteadiness on  feet (R26.81);Other abnormalities of gait and mobility (R26.89);Muscle weakness (generalized) (M62.81)   Activity Tolerance Patient limited by pain   Patient Left in bed;with call bell/phone within reach;with bed alarm set   Nurse Communication Mobility status        Time: 8863-8841 OT Time Calculation (min): 22 min  Charges: OT General Charges $OT Visit: 1 Visit OT Treatments $Self Care/Home Management : 8-22 mins  Darrin Apodaca K, OTD, OTR/L SecureChat Preferred Acute Rehab [phone]   Laneta POUR Koonce 09/12/2024, 12:59 PM

## 2024-09-12 NOTE — Progress Notes (Signed)
 Physical Therapy Treatment Patient Details Name: Maria [name] MRN: [id number] DOB: April 03, 1956 Today's Date: 09/12/2024   History of Present Illness Pt is a 68yo female presenting with weakness. 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 Xray knees Chronic tricompartmental degeneration. Underwent ACDF C4-5 9/17. PMH: CHF, chronic pain, depression, DM, GERD, HLD, HTN, MRSA, cervical disc surgery, chronic LE lymphedema.    PT Comments  Pt sleeping upon arrival to room, wakes easily and eager to participate in PT. Pt tolerating repeated transfers into standing, though complains of significant L>R knee pain with standing activity and moving stand>sit. Pt with min LLE buckling, but mostly pt-recovered today. Plan for post-acute rehab remains appropriate.      If plan is discharge home, recommend the following: Assist for transportation;Two people to help with walking and/or transfers;A lot of help with walking and/or transfers;A lot of help with bathing/dressing/bathroom   Can travel by private vehicle        Equipment Recommendations  Wheelchair (measurements PT);Wheelchair cushion (measurements PT);Hospital bed    Recommendations for Other Services       Precautions / Restrictions Precautions Precautions: Fall;Cervical Recall of Precautions/Restrictions: Intact Required Braces or Orthoses: Cervical Brace Cervical Brace: Soft collar Restrictions Weight Bearing Restrictions Per Provider Order: No     Mobility  Bed Mobility Overal bed mobility: Needs Assistance Bed Mobility: Rolling, Sidelying to Sit Rolling: Min assist, Used rails Sidelying to sit: Mod assist, Used rails, HOB elevated       General bed mobility comments: assist for completion of log roll to L, trunk elevation to upright. Pt able to scoot self forward with use of UEs and increased time    Transfers Overall transfer level: Needs assistance Equipment used: Rolling walker (2  wheels) Transfers: Sit to/from Stand, Bed to chair/wheelchair/BSC Sit to Stand: Mod assist Stand pivot transfers: Mod assist, From elevated surface         General transfer comment: assist for power up, rise, steady, and full upright facilitation via sacral input. stand x2, from EOB and BSC. Pivot towards R both times with PT supporting RW management and pt    Ambulation/Gait         Gait velocity: decr     General Gait Details: nt - pt with significant L knee pain   Stairs             Wheelchair Mobility     Tilt Bed    Modified Rankin (Stroke Patients Only)       Balance Overall balance assessment: Needs assistance, History of Falls Sitting-balance support: Feet supported Sitting balance-Leahy Scale: Fair     Standing balance support: Reliant on assistive device for balance, During functional activity, Bilateral upper extremity supported Standing balance-Leahy Scale: Poor                              Communication Communication Communication: No apparent difficulties  Cognition Arousal: Alert Behavior During Therapy: WFL for tasks assessed/performed   PT - Cognitive impairments: No apparent impairments                         Following commands: Intact      Cueing Cueing Techniques: Verbal cues  Exercises General Exercises - Lower Extremity Long Arc Quad: AROM, Both, 10 reps, Seated    General Comments        Pertinent Vitals/Pain Pain Assessment Pain  Assessment: Faces Faces Pain Scale: Hurts even more Pain Location: L>R knee during standing Pain Descriptors / Indicators: Grimacing, Moaning, Guarding Pain Intervention(s): Limited activity within patient's tolerance, Monitored during session, Repositioned    Home Living                          Prior Function            PT Goals (current goals can now be found in the care plan section) Acute Rehab PT Goals Patient Stated Goal: home PT Goal  Formulation: With patient Time For Goal Achievement: 09/22/24 Potential to Achieve Goals: Good Progress towards PT goals: Progressing toward goals    Frequency    Min 2X/week      PT Plan      Co-evaluation              AM-PAC PT 6 Clicks Mobility   Outcome Measure  Help needed turning from your back to your side while in a flat bed without using bedrails?: A Lot Help needed moving from lying on your back to sitting on the side of a flat bed without using bedrails?: A Lot Help needed moving to and from a bed to a chair (including a wheelchair)?: A Lot Help needed standing up from a chair using your arms (e.g., wheelchair or bedside chair)?: A Lot Help needed to walk in hospital room?: Total Help needed climbing 3-5 steps with a railing? : Total 6 Click Score: 10    End of Session Equipment Utilized During Treatment: Gait belt Activity Tolerance: Patient limited by pain;Patient limited by fatigue Patient left: with call bell/phone within reach;in chair;with chair alarm set Nurse Communication: Mobility status PT Visit Diagnosis: Muscle weakness (generalized) (M62.81);Difficulty in walking, not elsewhere classified (R26.2);Pain Pain - Right/Left: Left Pain - part of body: Knee     Time: 9044-8977 PT Time Calculation (min) (ACUTE ONLY): 27 min  Charges:    $Therapeutic Activity: 8-22 mins $Self Care/Home Management: 8-22 PT General Charges $$ ACUTE PT VISIT: 1 Visit                     Johana RAMAN, PT DPT Acute Rehabilitation Services Secure Chat Preferred  Office [phone]    Oz Gammel FORBES [place] 09/12/2024, 12:29 PM

## 2024-09-12 NOTE — TOC Progression Note (Addendum)
 Transition of Care [facility]) - Progression Note    Patient Details  Name: Maria [name] MRN: [id number] Date of Birth: 1956/01/15  Transition of [facility] CM/SW Contact  Montie LOISE Louder, [place] Phone Number: 09/12/2024, 12:30 PM  Clinical Narrative:     [facility] states they can admit patient tomorrow.   MD updated   Montie Louder, MSW, LCSW Clinical Social Worker    Expected Discharge Plan: Skilled Nursing Facility Barriers to Discharge: Insurance Authorization               Expected Discharge Plan and Services In-house Referral: NA Discharge Planning Services: CM Consult   Living arrangements for the past 2 months: Apartment Expected Discharge Date: 09/12/24               DME Arranged: N/A DME Agency: NA       HH Arranged: NA HH Agency: NA         Social Drivers of Health (SDOH) Interventions SDOH Screenings   Food Insecurity: No Food Insecurity (09/01/2024)  Housing: Low Risk  (09/01/2024)  Transportation Needs: No Transportation Needs (09/01/2024)  Utilities: Not At Risk (09/01/2024)  Alcohol  Screen: Low Risk  (12/30/2021)  Depression (PHQ2-9): Medium Risk (01/30/2022)  Financial Resource Strain: Medium Risk (12/30/2021)  Physical Activity: Inactive (12/30/2021)  Social Connections: Socially Isolated (09/01/2024)  Stress: No Stress Concern Present (12/30/2021)  Tobacco Use: Medium Risk (09/07/2024)    Readmission Risk Interventions    09/04/2024    4:45 PM 05/03/2024   10:25 AM 04/30/2024    4:03 PM  Readmission Risk Prevention Plan  Transportation Screening Complete Complete Complete  Medication Review Oceanographer) Complete Complete Complete  PCP or Specialist appointment within 3-5 days of discharge Complete Complete Complete  HRI or Home Care Consult Complete Complete Complete  SW Recovery Care/Counseling Consult Complete Complete Complete  Palliative Care Screening Not Applicable Not Applicable Not Applicable  Skilled Nursing Facility  Complete Not Applicable

## 2024-09-12 NOTE — Plan of Care (Signed)
 Problem: Education: Goal: Knowledge of General Education information will improve Description: Including pain rating scale, medication(s)/side effects and non-pharmacologic comfort measures 09/12/2024 0133 by Marvis Kenneth SAILOR, RN Outcome: Progressing 09/11/2024 2039 by Marvis Kenneth SAILOR, RN Outcome: Progressing   Problem: Health Behavior/Discharge Planning: Goal: Ability to manage health-related needs will improve 09/12/2024 0133 by Marvis Kenneth SAILOR, RN Outcome: Progressing 09/11/2024 2039 by Marvis Kenneth SAILOR, RN Outcome: Progressing   Problem: Clinical Measurements: Goal: Ability to maintain clinical measurements within normal limits will improve 09/12/2024 0133 by Marvis Kenneth SAILOR, RN Outcome: Progressing 09/11/2024 2039 by Marvis Kenneth SAILOR, RN Outcome: Progressing Goal: Will remain free from infection 09/12/2024 0133 by Marvis Kenneth SAILOR, RN Outcome: Progressing 09/11/2024 2039 by Marvis Kenneth SAILOR, RN Outcome: Progressing Goal: Diagnostic test results will improve 09/12/2024 0133 by Marvis Kenneth SAILOR, RN Outcome: Progressing 09/11/2024 2039 by Marvis Kenneth SAILOR, RN Outcome: Progressing Goal: Respiratory complications will improve 09/12/2024 0133 by Marvis Kenneth SAILOR, RN Outcome: Progressing 09/11/2024 2039 by Marvis Kenneth SAILOR, RN Outcome: Progressing Goal: Cardiovascular complication will be avoided 09/12/2024 0133 by Marvis Kenneth SAILOR, RN Outcome: Progressing 09/11/2024 2039 by Marvis Kenneth SAILOR, RN Outcome: Progressing   Problem: Activity: Goal: Risk for activity intolerance will decrease 09/12/2024 0133 by Marvis Kenneth SAILOR, RN Outcome: Progressing 09/11/2024 2039 by Marvis Kenneth SAILOR, RN Outcome: Progressing   Problem: Nutrition: Goal: Adequate nutrition will be maintained 09/12/2024 0133 by Marvis Kenneth SAILOR, RN Outcome: Progressing 09/11/2024 2039 by Marvis Kenneth SAILOR, RN Outcome: Progressing   Problem: Coping: Goal: Level of anxiety will  decrease 09/12/2024 0133 by Marvis Kenneth SAILOR, RN Outcome: Progressing 09/11/2024 2039 by Marvis Kenneth SAILOR, RN Outcome: Progressing   Problem: Elimination: Goal: Will not experience complications related to bowel motility 09/12/2024 0133 by Marvis Kenneth SAILOR, RN Outcome: Progressing 09/11/2024 2039 by Marvis Kenneth SAILOR, RN Outcome: Progressing Goal: Will not experience complications related to urinary retention 09/12/2024 0133 by Marvis Kenneth SAILOR, RN Outcome: Progressing 09/11/2024 2039 by Marvis Kenneth SAILOR, RN Outcome: Progressing   Problem: Pain Managment: Goal: General experience of comfort will improve and/or be controlled 09/12/2024 0133 by Marvis Kenneth SAILOR, RN Outcome: Progressing 09/11/2024 2039 by Marvis Kenneth SAILOR, RN Outcome: Progressing   Problem: Safety: Goal: Ability to remain free from injury will improve 09/12/2024 0133 by Marvis Kenneth SAILOR, RN Outcome: Progressing 09/11/2024 2039 by Marvis Kenneth SAILOR, RN Outcome: Progressing   Problem: Skin Integrity: Goal: Risk for impaired skin integrity will decrease 09/12/2024 0133 by Marvis Kenneth SAILOR, RN Outcome: Progressing 09/11/2024 2039 by Marvis Kenneth SAILOR, RN Outcome: Progressing   Problem: Education: Goal: Ability to describe self-care measures that may prevent or decrease complications (Diabetes Survival Skills Education) will improve 09/12/2024 0133 by Marvis Kenneth SAILOR, RN Outcome: Progressing 09/11/2024 2039 by Marvis Kenneth SAILOR, RN Outcome: Progressing Goal: Individualized Educational Video(s) 09/12/2024 0133 by Marvis Kenneth SAILOR, RN Outcome: Progressing 09/11/2024 2039 by Marvis Kenneth SAILOR, RN Outcome: Progressing   Problem: Coping: Goal: Ability to adjust to condition or change in health will improve 09/12/2024 0133 by Marvis Kenneth SAILOR, RN Outcome: Progressing 09/11/2024 2039 by Marvis Kenneth SAILOR, RN Outcome: Progressing   Problem: Fluid Volume: Goal: Ability to maintain a balanced  intake and output will improve 09/12/2024 0133 by Marvis Kenneth SAILOR, RN Outcome: Progressing 09/11/2024 2039 by Marvis Kenneth SAILOR, RN Outcome: Progressing   Problem: Health Behavior/Discharge Planning: Goal: Ability to identify and utilize available resources and services will improve 09/12/2024 0133 by Marvis Kenneth SAILOR, RN Outcome: Progressing 09/11/2024 2039 by  Marvis Kenneth SAILOR, RN Outcome: Progressing Goal: Ability to manage health-related needs will improve 09/12/2024 0133 by Marvis Kenneth SAILOR, RN Outcome: Progressing 09/11/2024 2039 by Marvis Kenneth SAILOR, RN Outcome: Progressing   Problem: Metabolic: Goal: Ability to maintain appropriate glucose levels will improve 09/12/2024 0133 by Marvis Kenneth SAILOR, RN Outcome: Progressing 09/11/2024 2039 by Marvis Kenneth SAILOR, RN Outcome: Progressing   Problem: Nutritional: Goal: Maintenance of adequate nutrition will improve 09/12/2024 0133 by Marvis Kenneth SAILOR, RN Outcome: Progressing 09/11/2024 2039 by Marvis Kenneth SAILOR, RN Outcome: Progressing Goal: Progress toward achieving an optimal weight will improve 09/12/2024 0133 by Marvis Kenneth SAILOR, RN Outcome: Progressing 09/11/2024 2039 by Marvis Kenneth SAILOR, RN Outcome: Progressing   Problem: Skin Integrity: Goal: Risk for impaired skin integrity will decrease 09/12/2024 0133 by Marvis Kenneth SAILOR, RN Outcome: Progressing 09/11/2024 2039 by Marvis Kenneth SAILOR, RN Outcome: Progressing   Problem: Tissue Perfusion: Goal: Adequacy of tissue perfusion will improve 09/12/2024 0133 by Marvis Kenneth SAILOR, RN Outcome: Progressing 09/11/2024 2039 by Marvis Kenneth SAILOR, RN Outcome: Progressing   Problem: Education: Goal: Ability to verbalize activity precautions or restrictions will improve 09/12/2024 0133 by Marvis Kenneth SAILOR, RN Outcome: Progressing 09/11/2024 2039 by Marvis Kenneth SAILOR, RN Outcome: Progressing Goal: Knowledge of the prescribed therapeutic regimen will  improve 09/12/2024 0133 by Marvis Kenneth SAILOR, RN Outcome: Progressing 09/11/2024 2039 by Marvis Kenneth SAILOR, RN Outcome: Progressing Goal: Understanding of discharge needs will improve 09/12/2024 0133 by Marvis Kenneth SAILOR, RN Outcome: Progressing 09/11/2024 2039 by Marvis Kenneth SAILOR, RN Outcome: Progressing   Problem: Activity: Goal: Ability to avoid complications of mobility impairment will improve 09/12/2024 0133 by Marvis Kenneth SAILOR, RN Outcome: Progressing 09/11/2024 2039 by Marvis Kenneth SAILOR, RN Outcome: Progressing Goal: Ability to tolerate increased activity will improve 09/12/2024 0133 by Marvis Kenneth SAILOR, RN Outcome: Progressing 09/11/2024 2039 by Marvis Kenneth SAILOR, RN Outcome: Progressing Goal: Will remain free from falls 09/12/2024 0133 by Marvis Kenneth SAILOR, RN Outcome: Progressing 09/11/2024 2039 by Marvis Kenneth SAILOR, RN Outcome: Progressing   Problem: Bowel/Gastric: Goal: Gastrointestinal status for postoperative course will improve 09/12/2024 0133 by Marvis Kenneth SAILOR, RN Outcome: Progressing 09/11/2024 2039 by Marvis Kenneth SAILOR, RN Outcome: Progressing   Problem: Clinical Measurements: Goal: Ability to maintain clinical measurements within normal limits will improve 09/12/2024 0133 by Marvis Kenneth SAILOR, RN Outcome: Progressing 09/11/2024 2039 by Marvis Kenneth SAILOR, RN Outcome: Progressing Goal: Postoperative complications will be avoided or minimized 09/12/2024 0133 by Marvis Kenneth SAILOR, RN Outcome: Progressing 09/11/2024 2039 by Marvis Kenneth SAILOR, RN Outcome: Progressing Goal: Diagnostic test results will improve 09/12/2024 0133 by Marvis Kenneth SAILOR, RN Outcome: Progressing 09/11/2024 2039 by Marvis Kenneth SAILOR, RN Outcome: Progressing   Problem: Pain Management: Goal: Pain level will decrease 09/12/2024 0133 by Marvis Kenneth SAILOR, RN Outcome: Progressing 09/11/2024 2039 by Marvis Kenneth SAILOR, RN Outcome: Progressing   Problem: Skin  Integrity: Goal: Will show signs of wound healing 09/12/2024 0133 by Marvis Kenneth SAILOR, RN Outcome: Progressing 09/11/2024 2039 by Marvis Kenneth SAILOR, RN Outcome: Progressing   Problem: Health Behavior/Discharge Planning: Goal: Identification of resources available to assist in meeting health care needs will improve 09/12/2024 0133 by Marvis Kenneth SAILOR, RN Outcome: Progressing 09/11/2024 2039 by Marvis Kenneth SAILOR, RN Outcome: Progressing   Problem: Bladder/Genitourinary: Goal: Urinary functional status for postoperative course will improve 09/12/2024 0133 by Marvis Kenneth SAILOR, RN Outcome: Progressing 09/11/2024 2039 by Marvis Kenneth SAILOR, RN Outcome: Progressing

## 2024-09-12 NOTE — Progress Notes (Signed)
 Overall looking little better.  Her neck pain is well-controlled.  She is not having any true radicular pain numbness or weakness.  She is slowly mobilizing with therapy.  Her wound is clean dry and intact.  Her neck is soft.  Progressing reasonably well following anterior cervical discectomy and fusion.  Continue efforts at mobilization and therapy.  Patient will likely need skilled nurse facility placement

## 2024-09-12 NOTE — Discharge Summary (Signed)
 Physician Discharge Summary   Patient: Maria [name] MRN: [id number] DOB: [birth year]  Admit date:     09/01/2024  Discharge date: 09/12/24  Discharge Physician: Elgie Butter   PCP: Ileen [name], NP   Recommendations at discharge:  Please follow up with Neurosurgery as recommended.  Please follow up with PCP in one week.  Please check cbc and bmp in one week.  Recommend outpatient follow up with opthalmopathy.   Discharge Diagnoses: Principal Problem:   UTI (urinary tract infection) Active Problems:   Chronic diastolic HF (heart failure) (HCC)  Resolved Problems:   * No resolved hospital problems. *  Hospital Course: 68 y.o. 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  Diagnosed with cervical compression myelopathy.  Neuro surgery has evaluated and transferred from [facility] to [facility] for anterior cervical decompression and fusion at C4-5 on 9/18. Post op pain control and therapy eval done, recommending SNF.     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  MRI of the entire spine showing multiple levels of stenosis. NS on board and she underwent cervical decompression and fusion surgery at C4 C5 on 9/18.  Wound vac removed. Pain control with oxycodone  and therapy evaluations recommending SNF.      Double vision/ Improved.  MRI brain without any new stroke.  She also reports floaters since 2 months.  Recommend outpatient follow up with ophthalmology.      Hypertension:  BP parameters are optimal.        Chronic pain syndrome:  On oxycodone  and dilaudid  for pain.  On Robaxin  for spasms. Therapy  evaluation done and recommended SNF.        Type 2 DM with hyperglycemia   Last A1c is 5. Recommend a snack before she goes to bed.  On Mounjaro at home.        Chronic diastolic CHF She appears compensated. Her BP parameters are labile, hence started on ly on 20 mg of torsemide  on discharge. Can up titrate the meds as her BP improves.       Possible UTI Completed the course of IV antibiotics.      Bilateral knee pain. More in the left knee.  Physical therapy eval recommending SNF. Outpatient follow up with orthopedics.  X rays showing severe osteoarthritis.        AKI Resolved.      Body mass index is 54.54 kg/m. Morbid Obesity.        Estimated body mass index is 54.54 kg/m as calculated from the following:   Height as of this encounter: 5' 3 (1.6 m).   Weight as of this encounter: 139.7 kg.     Consultants: neurosurgery, cardiology.  Procedures performed: c spine fusion surgery.   Disposition: Skilled nursing facility Diet recommendation:  Discharge Diet Orders (From admission, onward)     Start     Ordered   09/12/24 0000  Diet - low sodium heart healthy        09/12/24 1138           Carb modified diet DISCHARGE MEDICATION: Allergies as of 09/12/2024       Reactions   Amoxicillin -pot Clavulanate Nausea And Vomiting,  Other (See Comments)   Projectile vomiting   Adhesive [tape] Other (See Comments)   Tears skin off - use paper tape    Codeine Other (See Comments)   hallucinations   Crestor  [rosuvastatin  Calcium ] Other (See Comments)   Severe muscle cramps   Cyclobenzaprine  Other (See Comments)   Hallucinations    Cymbalta  [duloxetine  Hcl] Other (See Comments)   Hallucinations    Gabapentin  Other (See Comments)   Trembling   Lexapro [escitalopram] Other (See Comments)   Hallucinations    Lyrica [pregabalin] Other (See Comments)   weakness   Morphine  And Codeine Nausea And Vomiting, Other (See Comments)   Migraine headaches    Vicodin [hydrocodone-acetaminophen ] Other (See Comments)   hallucinations   Latex Rash        Medication List     STOP taking these medications    ciprofloxacin  500 MG tablet Commonly known as: CIPRO    escitalopram 20 MG tablet Commonly known as: LEXAPRO   spironolactone  25 MG tablet Commonly known as: ALDACTONE    Tylenol  8 Hour Arthritis Pain 650 MG CR tablet Generic drug: acetaminophen        TAKE these medications    albuterol  108 (90 Base) MCG/ACT inhaler Commonly known as: VENTOLIN  HFA Inhale 1 puff into the lungs every 6 (six) hours as needed for wheezing or shortness of breath.   cholecalciferol  25 MCG (1000 UNIT) tablet Commonly known as: VITAMIN D3 Take 1,000 Units by mouth daily.   clonazePAM  1 MG tablet Commonly known as: KLONOPIN  Take 1 tablet (1 mg total) by mouth 2 (two) times daily as needed for up to 5 days. for anxiety What changed:  when to take this reasons to take this additional instructions   cyanocobalamin  1000 MCG tablet Commonly known as: VITAMIN B12 Take 1 tablet (1,000 mcg total) by mouth daily.   diclofenac  Sodium 1 % Gel Commonly known as: VOLTAREN  Apply 4 g topically 4 (four) times daily.   famotidine  40 MG tablet Commonly known as: PEPCID  Take 40 mg by mouth at bedtime.   feeding supplement Liqd Take 237 mLs by mouth 2 (two) times daily between meals.   fluticasone  50 MCG/ACT nasal spray Commonly known as: FLONASE  Place 1 spray into both nostrils daily.   methocarbamol  500 MG tablet Commonly known as: ROBAXIN  Take 1 tablet (500 mg total) by mouth every 6 (six) hours as needed for muscle spasms.   metolazone  2.5 MG tablet Commonly known as: ZAROXOLYN  Take 2.5 mg by mouth once a week.   metoprolol  succinate 25 MG 24 hr tablet Commonly known as: TOPROL -XL Take 1 tablet (25 mg total) by mouth daily.   Mounjaro 15 MG/0.5ML Pen Generic drug: tirzepatide Inject 15 mg into the skin every Wednesday.   Narcan  4  MG/0.1ML Liqd nasal spray kit Generic drug: naloxone  Place 0.4 mg into the nose daily as needed (opioid overdose).   nystatin  powder Commonly known as: MYCOSTATIN /NYSTOP  Apply 1 Application topically daily.   ondansetron  4 MG disintegrating tablet Commonly known as: ZOFRAN -ODT Take 4 mg by mouth every 8 (eight) hours as needed for nausea or vomiting (dissolve the tablet orally).   Oxycodone  HCl 10 MG Tabs Take 1 tablet (10 mg total) by mouth every 6 (six) hours for 5 days.   pantoprazole  40 MG tablet Commonly known as: PROTONIX  TAKE ONE TABLET BY MOUTH DAILY What changed: when to take this   polyethylene glycol 17 g packet Commonly known as: MIRALAX  / GLYCOLAX  Take 17 g by mouth daily. Start taking  on: September 13, 2024   potassium chloride  SA 20 MEQ tablet Commonly known as: KLOR-CON  M Take 40 mEq by mouth daily.   pramipexole  0.5 MG tablet Commonly known as: MIRAPEX  Take 0.5 mg by mouth 3 (three) times daily.   senna-docusate 8.6-50 MG tablet Commonly known as: Senokot-S Take 2 tablets by mouth 2 (two) times daily.   torsemide  20 MG tablet Commonly known as: DEMADEX  Take 1 tablet (20 mg total) by mouth daily. Hold if SBP is less than 100. Keep MAP >75 MM HG. What changed:  how much to take additional instructions   traZODone  50 MG tablet Commonly known as: DESYREL  Take 0.5 tablets (25 mg total) by mouth at bedtime as needed for up to 20 days for sleep.               Discharge Care Instructions  (From admission, onward)           Start     Ordered   09/12/24 0000  Discharge wound care:       Comments: Discharge wound care as per NS   09/12/24 1138            Contact information for after-discharge care     Destination     [facility] .   Service: Skilled Nursing Contact information: [address] [place]  [address] [phone]                    Discharge Exam: Fredricka Weights   09/03/24 0921  09/06/24 1253  Weight: (!) 140.2 kg (!) 139.7 kg   General exam: Appears calm and comfortable  Respiratory system: Clear to auscultation. Respiratory effort normal. Cardiovascular system: S1 & S2 heard, RRR. No JVD,  Gastrointestinal system: Abdomen is nondistended, soft and nontender.  Central nervous system: Alert and oriented.  Extremities:left knee pain.  Skin: No rashes, lesions or ulcers Psychiatry: Mood & affect appropriate.    Condition at discharge: fair  The results of significant diagnostics from this hospitalization (including imaging, microbiology, ancillary and laboratory) are listed below for reference.   Imaging Studies: DG Knee Complete 4 Views Left Result Date: 09/11/2024 EXAM: 4 or more VIEW(S) XRAY OF THE LEFT KNEE 09/11/2024 02:59:00 PM COMPARISON: 09/01/2024 CLINICAL HISTORY: Knee pain, acute FINDINGS: BONES AND JOINTS: No acute fracture. No focal osseous lesion. No significant joint effusion. Severe tricompartmental osteoarthritis with near complete joint space loss, prominent subchondral sclerosis, and large marginal osteophyte formation. Large marginal osteophytes in the medial compartment with near complete joint space loss. SOFT TISSUES: The soft tissues are unremarkable. IMPRESSION: 1. Severe tricompartmental osteoarthritis. No acute findings or change from prior. Electronically signed by: Andrea Gasman MD 09/11/2024 06:01 PM EDT RP Workstation: [id number]   MR BRAIN WO CONTRAST Result Date: 09/09/2024 CLINICAL DATA:  68 year old female with double vision since 09/07/2024. postoperative day 2 status post revision/extension of cervical spine ACDF. EXAM: MRI HEAD WITHOUT CONTRAST TECHNIQUE: Multiplanar, multiecho pulse sequences of the brain and surrounding structures were obtained without intravenous contrast. COMPARISON:  Total spine MRI 09/02/2024.  Brain MRI 07/24/2024. FINDINGS: Brain: Stable cerebral volume, normal for age. No restricted diffusion to suggest  acute infarction. No midline shift, mass effect, evidence of mass lesion, ventriculomegaly, extra-axial collection or acute intracranial hemorrhage. Cervicomedullary junction and pituitary are within normal limits. Mild for age scattered cerebral white matter T2 and FLAIR hyperintensity in both hemispheres appears stable from last month. No cortical encephalomalacia. No chronic cerebral blood products identified. Deep gray nuclei  brainstem and cerebellum appear stable and within normal limits. Vascular: Major intracranial vascular flow voids are stable. Skull and upper cervical spine: Partially visible cervical spine fusion hardware artifact. Normal background bone marrow signal. Stable skull. Sinuses/Orbits: Normal suprasellar cistern. Negative visible noncontrast cavernous sinus. Globes and orbits soft tissues appears stable and negative. Paranasal sinuses and mastoids are stable and well aerated. Other: Visible internal auditory structures appear normal. Stable visible scalp and face. IMPRESSION: No acute intracranial abnormality. Stable MRI appearance of the brain since last month with mild for age nonspecific white matter signal changes. Electronically Signed   By: VEAR Hurst M.D.   On: 09/09/2024 12:26   DG Cervical Spine 1 View Result Date: 09/07/2024 EXAM: 1 VIEW XRAY OF THE CERVICAL SPINE 09/07/2024 06:40:00 PM COMPARISON: None available. CLINICAL HISTORY: [id number] Surgery, elective [id number]. ANTERIOR CERVICAL DECOMPRESSION/DISCECTOMY FUSION CERVICAL FOUR-FIVE LONEY REMOVAL CERVICAL FIVE-SEVEN PLATE ; RSTO performed by SMR; 12 seconds of fluoro; 4.49 mGy; Dr. Louis; OR 21; C-Arm 17 FINDINGS: BONES: Postsurgical changes of anterior cervical fusion hardware at C4-5. The lower cervical spine is not well visualized. DISCS AND DEGENERATIVE CHANGES: No severe degenerative changes. SOFT TISSUES: Partially visualized endotracheal tube. IMPRESSION: 1. Postsurgical changes of anterior cervical fusion hardware at C4-5,  with intact hardware. 2. Lower cervical spine not well visualized. Electronically signed by: Donnice Mania MD 09/07/2024 08:36 PM EDT RP Workstation: [id number]   DG C-Arm 1-60 Min-No Report Result Date: 09/07/2024 Fluoroscopy was utilized by the requesting physician.  No radiographic interpretation.   DG C-Arm 1-60 Min-No Report Result Date: 09/07/2024 Fluoroscopy was utilized by the requesting physician.  No radiographic interpretation.   DG CHEST PORT 1 VIEW Result Date: 09/06/2024 CLINICAL DATA:  Preop for cervical spine surgery. EXAM: PORTABLE CHEST 1 VIEW COMPARISON:  Apr 26, 2024. FINDINGS: Stable cardiomegaly. Both lungs are clear. The visualized skeletal structures are unremarkable. IMPRESSION: No active disease. Electronically Signed   By: Lynwood Landy Raddle M.D.   On: 09/06/2024 10:59   MR LUMBAR SPINE WO CONTRAST Result Date: 09/02/2024 CLINICAL DATA:  Initial evaluation for lower back pain, ambulatory dysfunction. EXAM: MRI LUMBAR SPINE WITHOUT CONTRAST TECHNIQUE: Multiplanar, multisequence MR imaging of the lumbar spine was performed. No intravenous contrast was administered. COMPARISON:  Prior study from 07/30/2023. FINDINGS: Segmentation: Transitional features about the lumbosacral junction with partial sacralization of the L5 vertebral body. Alignment: 3 mm facet mediated anterolisthesis of L3 on L4. Alignment otherwise normal with preservation of the normal lumbar lordosis. Vertebrae: Vertebral body height maintained without acute or chronic fracture. Bone marrow signal intensity within normal limits. No worrisome osseous lesions. No abnormal marrow edema. Conus medullaris and cauda equina: Conus extends to the L1 level. Conus medullaris within normal limits. Nerve roots of the cauda equina are somewhat irregular proximal to the L3-4 level due to stenosis. Paraspinal and other soft tissues: Paraspinous soft tissues within normal limits. Disc levels: A degree of underlying congenital spinal  stenosis noted. L1-2: Negative interspace. Mild right worse than left facet arthrosis. No spinal stenosis. Foramina remain patent. L2-3: Disc desiccation with mild diffuse disc bulge. Mild facet and ligament flavum hypertrophy with associated trace joint effusions. Resultant mild-to-moderate spinal stenosis with moderate bilateral L2 foraminal narrowing. L3-4: Trace anterolisthesis. Disc desiccation with diffuse disc bulge. Superimposed right foraminal to extraforaminal disc protrusion with annular fissure (series 4, image 22). Advanced right worse than left facet arthrosis with small joint effusion on the right. Resultant severe multifactorial spinal stenosis, with the thecal sac measuring 4 mm in  AP diameter. Moderate bilateral L3 foraminal narrowing. L4-5: Diffuse disc bulge with disc desiccation. Mild reactive endplate spurring. Superimposed broad-based right subarticular to foraminal disc protrusion with annular fissure (series 4, image 26). Severe left with mild-to-moderate right facet arthrosis. Ligamentum flavum hypertrophy. Resultant severe multifactorial spinal stenosis. Moderate right with moderate to severe left L4 foraminal stenosis. L5-S1: Negative interspace. Mild facet spurring. No canal or foraminal stenosis. IMPRESSION: 1. No acute abnormality within the lumbar spine. 2. 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 3. Disc bulge with facet arthrosis at L2-3 with resultant mild-to-moderate spinal stenosis, with moderate bilateral L2 foraminal narrowing. Electronically Signed   By: Morene Hoard M.D.   On: 09/02/2024 23:11   MR THORACIC SPINE WO CONTRAST Result Date: 09/02/2024 CLINICAL DATA:  Initial evaluation for ataxia, thoracic pathology suspected. EXAM: MRI THORACIC SPINE WITHOUT CONTRAST TECHNIQUE: Multiplanar, multisequence MR imaging of the thoracic spine was  performed. No intravenous contrast was administered. COMPARISON:  None Available. FINDINGS: Alignment: Mild dextroscoliosis with exaggeration of the normal thoracic kyphosis. No listhesis. Vertebrae: Vertebral body height maintained without acute or chronic fracture. Bone marrow signal intensity within normal limits. No worrisome osseous lesions. No abnormal marrow edema. Cord:  Normal signal and morphology. Paraspinal and other soft tissues: Paraspinous soft tissues within normal limits. Trace layering bilateral pleural effusions noted. Disc levels: T5-6: Small left paracentral to foraminal disc protrusion. No stenosis. T8-9: Disc bulge with small right greater than left paracentral disc protrusions indenting the ventral thecal sac. No significant spinal stenosis. Foramina remain patent. Otherwise, minor for age degenerative disc disease elsewhere within the thoracic spine no other significant disc bulge or focal disc herniation. Mild multilevel facet arthrosis. No significant spinal stenosis. Foramina remain patent. No neural impingement. IMPRESSION: 1. Normal MRI appearance of the thoracic spinal cord. No cord signal changes to suggest myelopathy. 2. Mild for age degenerative spondylosis without significant stenosis or neural impingement. 3. Trace layering bilateral pleural effusions. Electronically Signed   By: Morene Hoard M.D.   On: 09/02/2024 21:55   MR CERVICAL SPINE WO CONTRAST Result Date: 09/02/2024 CLINICAL DATA:  Initial evaluation for chronic neck pain, ambulatory dysfunction. EXAM: MRI CERVICAL SPINE WITHOUT CONTRAST TECHNIQUE: Multiplanar, multisequence MR imaging of the cervical spine was performed. No intravenous contrast was administered. COMPARISON:  Prior MRI from 02/21/2024. FINDINGS: Alignment: Straightening of the normal cervical lordosis. 2 mm degenerative retrolisthesis of C4 on C5. Vertebrae: Prior ACDF at C5-C7 with solid arthrodesis. Vertebral body height maintained without  acute or chronic fracture. No worrisome osseous lesions. Prominent degenerative reactive endplate change with marrow edema present about the C4-5 interspace. Reactive marrow edema also noted about the left C3-4 facet due to active facet arthritis (series 8, image 14). Cord: Cord flattening with patchy cord signal abnormality involving the cord at the level of C4-5, which could reflect edema and/or myelomalacia, greater on the left (series 9, image 14). These changes were present on prior MRI, although are somewhat more prominent on today's exam. Otherwise normal signal morphology. Posterior Fossa, vertebral arteries, paraspinal tissues: Visualized brain and posterior fossa within normal limits. Craniocervical junction normal. Paraspinous soft tissues within normal limits. Normal flow voids seen within the vertebral arteries bilaterally. Disc levels: C2-C3: Negative interspace. Mild right greater than left facet arthrosis. No canal or foraminal stenosis. C3-C4: Disc bulge with left greater than right uncovertebral spurring. Severe left with mild right facet arthrosis. Resultant mild-to-moderate spinal  stenosis. Mild right with severe left C4 foraminal narrowing. C4-C5: Degenerative intervertebral disc space narrowing with retrolisthesis. Diffuse disc bulge with bilateral uncovertebral spurring. Slight inferior migration of disc material noted. Superimposed left greater than right facet hypertrophy. Resultant severe multifactorial spinal stenosis with severe bilateral foraminal narrowing. Secondary cord flattening with associated patchy cord signal changes as above. Thecal sac measures 4 mm in AP diameter at its most narrow point. This is similar to prior. C5-C6: Prior fusion. Right paracentral endplate spurring indents the ventral thecal sac with residual mild cord flattening, but no significant spinal stenosis. Foramina appear patent. C6-C7: Prior fusion. No residual spinal stenosis. Foramina appear patent. C7-T1:  Disc desiccation with mild uncovertebral spurring, but no significant disc bulge. Mild right greater than left facet arthrosis. No spinal stenosis. Mild left with moderate right C8 foraminal narrowing, stable. IMPRESSION: 1. Prior ACDF at C5-C7 without residual or recurrent spinal stenosis. 2. 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 Associated cord flattening with patchy cord signal abnormality at this level, which could reflect edema and/or myelomalacia. Overall, changes are similar as compared to previous MRI from 02/21/2024. 3. Disc bulge with uncovertebral and facet arthrosis at C3-4 with resultant mild to moderate spinal stenosis, with severe left C4 foraminal narrowing. 4. Changes of active facet arthritis about the left C3-4 facet, with additional prominent degenerative reactive marrow edema about the C4-5 interspace. Findings could contribute to neck pain. Electronically Signed   By: Morene Hoard M.D.   On: 09/02/2024 21:48   DG Knee Complete 4 Views Left Result Date: 09/01/2024 CLINICAL DATA:  68 year old female with chronic knee pain. EXAM: LEFT KNEE - COMPLETE 4+ VIEW COMPARISON:  Left knee radiographs 12/28/2023. FINDINGS: Four views. Chronic bulky tricompartmental degenerative osteophytosis. Severe medial compartment joint space loss. Lateral view is slightly oblique. Cannot exclude suprapatellar joint effusion. Stable bone mineralization. No acute osseous abnormality identified. IMPRESSION: Chronic tricompartmental degeneration with severe medial joint space loss. Cannot exclude joint effusion but no acute osseous abnormality identified. Electronically Signed   By: VEAR Hurst M.D.   On: 09/01/2024 06:27   DG Knee Complete 4 Views Right Result Date: 09/01/2024 EXAM: 4 VIEW(S) XRAY OF THE KNEE 09/01/2024 05:55:54 AM COMPARISON: 12/30/2023 CLINICAL HISTORY: Pain. No injury, chronic knee pain, unable to bear weight. FINDINGS: BONES AND JOINTS: No acute  fracture. Marginal spurs about all 3 compartments of the knee. Medial and patellofemoral compartment narrowing and sharpening of the tibial spines. SOFT TISSUES: The soft tissues are unremarkable. IMPRESSION: 1. 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 2. No acute findings. Electronically signed by: Waddell Calk MD 09/01/2024 06:26 AM EDT RP Workstation: [id number]    Microbiology: Results for orders placed or performed during the hospital encounter of 09/01/24  Urine Culture     Status: Abnormal   Collection Time: 09/01/24  7:38 AM   Specimen: Urine, Clean Catch  Result Value Ref Range Status   Specimen Description   Final    URINE, CLEAN CATCH Performed at [facility], [address] [place], [place] [address]    Special Requests   Final    NONE Performed at [facility], [address] [place], [place] [address]    Culture MULTIPLE SPECIES PRESENT, SUGGEST RECOLLECTION (A)  Final   Report Status 09/02/2024 FINAL  Final  Surgical pcr screen     Status: None   Collection Time: 09/07/24  1:30 AM  Result Value Ref Range Status   MRSA, PCR  NEGATIVE NEGATIVE Final   Staphylococcus aureus NEGATIVE NEGATIVE Final    Comment: (NOTE) The Xpert SA Assay (FDA approved for NASAL specimens in patients 84 years of age and older), is one component of a comprehensive surveillance program. It is not intended to diagnose infection nor to guide or monitor treatment. Performed at [facility], [address], [place], [place] [address]     Labs: CBC: Recent Labs  Lab 09/06/24 0425 09/10/24 0443  WBC 6.8 8.4  NEUTROABS  --  4.6  HGB 12.3 11.3*  HCT 38.8 34.8*  MCV 93.7 89.7  PLT 242 237   Basic Metabolic Panel: Recent Labs  Lab 09/06/24 0425 09/10/24 0443  NA 139 137  K 4.5 4.1  CL 103 102  CO2 26 27  GLUCOSE 106* 98  BUN 19 18  CREATININE 0.86 0.91  CALCIUM  9.3 8.6*  MG  2.3  --    Liver Function Tests: No results for input(s): AST, ALT, ALKPHOS, BILITOT, PROT, ALBUMIN  in the last 168 hours. CBG: Recent Labs  Lab 09/11/24 1655 09/11/24 2134 09/11/24 2307 09/12/24 0338 09/12/24 0850  GLUCAP 130* 139* 108* 113* 100*    Discharge time spent: 41 minutes.   Signed: Elgie Butter, MD [facility] 09/12/2024

## 2024-09-13 LAB — GLUCOSE, CAPILLARY
Glucose-Capillary: 96 mg/dL (ref 70–99)
Glucose-Capillary: 96 mg/dL (ref 70–99)
Glucose-Capillary: 103 mg/dL — ABNORMAL HIGH (ref 70–99)

## 2024-09-13 NOTE — Progress Notes (Signed)
 [facility] contacted to give report. Unable to reach an RN after being on hold 10 minutes.

## 2024-09-13 NOTE — Progress Notes (Signed)
 Patient's possessions collected and returned to patient. IV removed without issue. Patient departed facility with PTAR.

## 2024-09-13 NOTE — Progress Notes (Signed)
 [facility]   Patient is stable for discharge.  Please see details of discharge summary from 09/12/2024 by Dr. Cherlyn.  Vitals:   09/13/24 0745 09/13/24 1131  BP: (!) 130/58 128/60  Pulse: 70 71  Resp: 16 18  Temp:  98 F (36.7 C)  SpO2: 96% 96%

## 2024-09-13 NOTE — Progress Notes (Signed)
 Neck pain better today.  Neuroexam stable.  Wound clean and dry.  Continue efforts at mobilization and therapy.  Awaiting SNF placement.

## 2024-09-13 NOTE — TOC Transition Note (Signed)
 Transition of Care [facility]) - Discharge Note   Patient Details  Name: Maria [name] MRN: [id number] Date of Birth: September 28, 1956  Transition of Care [facility]) CM/SW Contact:  Montie LOISE Louder, LCSW Phone Number: 09/13/2024, 2:25 PM   Clinical Narrative:     Patient will Discharge to: [facility] Discharge Date: 09/13/24 Transport Ab:[facility]  Per MD patient is ready for discharge. RN, patient, and facility notified of discharge. Discharge Summary sent to facility. RN given number for [phone], Room 116. Ambulance transport requested for patient.   Clinical Social Worker signing off.  Montie Louder, MSW, LCSW Clinical Social Worker     Final next level of care: Skilled Nursing Facility Barriers to Discharge: No Barriers Identified   Patient Goals and CMS Choice Patient states their goals for this hospitalization and ongoing recovery are:: SNF CMS Medicare.gov Compare Post Acute Care list provided to::  (NA) Choice offered to / list presented to : NA Christmas ownership interest in [facility].provided to:: Parent NA    Discharge Placement PASRR number recieved: 09/13/24 Existing PASRR number confirmed : 09/13/24          Patient chooses bed at: Other - please specify in the comment section below: [facility]) Patient to be transferred to facility by: [facility] Name of family member notified: [name] Patient and family notified of of transfer: 09/13/24  Discharge Plan and Services Additional resources added to the After Visit Summary for   In-house Referral: NA Discharge Planning Services: CM Consult            DME Arranged: N/A DME Agency: NA       HH Arranged: NA HH Agency: NA        Social Drivers of Health (SDOH) Interventions SDOH Screenings   Food Insecurity: No Food Insecurity (09/01/2024)  Housing: Low Risk  (09/01/2024)  Transportation Needs: No Transportation Needs (09/01/2024)  Utilities: Not At Risk (09/01/2024)   Alcohol  Screen: Low Risk  (12/30/2021)  Depression (PHQ2-9): Medium Risk (01/30/2022)  Financial Resource Strain: Medium Risk (12/30/2021)  Physical Activity: Inactive (12/30/2021)  Social Connections: Socially Isolated (09/01/2024)  Stress: No Stress Concern Present (12/30/2021)  Tobacco Use: Medium Risk (09/07/2024)     Readmission Risk Interventions    09/04/2024    4:45 PM 05/03/2024   10:25 AM 04/30/2024    4:03 PM  Readmission Risk Prevention Plan  Transportation Screening Complete Complete Complete  Medication Review Oceanographer) Complete Complete Complete  PCP or Specialist appointment within 3-5 days of discharge Complete Complete Complete  HRI or Home Care Consult Complete Complete Complete  SW Recovery Care/Counseling Consult Complete Complete Complete  Palliative Care Screening Not Applicable Not Applicable Not Applicable  Skilled Nursing Facility Complete Not Applicable

## 2024-09-27 ENCOUNTER — Encounter: Admitting: Psychology

## 2024-09-28 ENCOUNTER — Institutional Professional Consult (permissible substitution) (INDEPENDENT_AMBULATORY_CARE_PROVIDER_SITE_OTHER)

## 2024-10-04 ENCOUNTER — Ambulatory Visit (INDEPENDENT_AMBULATORY_CARE_PROVIDER_SITE_OTHER)

## 2024-10-04 VITALS — BP 124/68 | HR 66 | Temp 98.0°F | Wt 281.0 lb

## 2024-10-04 DIAGNOSIS — K118 Other diseases of salivary glands: Secondary | ICD-10-CM

## 2024-10-04 NOTE — Progress Notes (Unsigned)
 HPI:   Maria [name] is a 68 y.o. female who presents as a new patient being seen in consultation for a left parotid lesion. Patient was recently hospitalized and evaluated and treated for a UTI. She then underwent an ACDF and is currently in rehab for this. She states she will likely be out of rehab next week. She is here today as her MRI of her brain showed a lesion in the left parotid. She denies noticing it herself. Denies any pain or tingling in the left face. Denies any history of surgery in the area. Denies any dry mouth. States that she notes she actually drools at night. She does have a 30 pack year smoking history and states that she quit smoking in 2009. She also quit drinking alcohol  in the 1990s and does not do any recreational drugs.     PMH/Meds/All/SocHx/FamHx/ROS: Past Medical History:  Diagnosis Date   Acute lymphadenitis 2011   ALLERGIC RHINITIS 08/10/2007   ANXIETY 12/06/2007   Atherosclerotic peripheral vascular disease 06/13/2013   Aorta on CT June 2014   Cellulitis 2011   Cervical disc disease 03/09/2012   CHF (congestive heart failure) (HCC)    Chronic pain 03/09/2012   DEPRESSION 12/06/2007   Diabetes (HCC)    DIVERTICULOSIS, [place] 12/06/2007   GERD 12/06/2007   Hepatitis    age 62 hepatitis A   HLD (hyperlipidemia) 05/24/2019   HYPERTENSION 12/06/2007   Lumbar disc disease 03/09/2012   Mesenteric adenitis    MRSA 2006   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 2010   CERVICAL  DISC SURGERY  may 24th 2010   COLONOSCOPY WITH PROPOFOL  N/A 07/10/2016   Procedure: COLONOSCOPY WITH PROPOFOL ;  Surgeon: Elspeth Deward Naval, MD;  Location: [facility] ENDOSCOPY;  Service: Gastroenterology;  Laterality: N/A;   COLONOSCOPY WITH PROPOFOL  N/A 09/26/2019   Procedure: COLONOSCOPY WITH PROPOFOL ;  Surgeon: Naval Elspeth SQUIBB, MD;  Location: [facility];  Service: Gastroenterology;  Laterality: N/A;   ESOPHAGOGASTRODUODENOSCOPY (EGD) WITH PROPOFOL  N/A 07/10/2016   Procedure: ESOPHAGOGASTRODUODENOSCOPY (EGD) WITH PROPOFOL ;  Surgeon: Elspeth Deward Naval, MD;  Location: [facility];  Service: Gastroenterology;  Laterality: N/A;   POLYPECTOMY  09/26/2019   Procedure: POLYPECTOMY;  Surgeon: Naval Elspeth SQUIBB, MD;  Location: [facility];  Service: Gastroenterology;;   RADIOLOGY WITH ANESTHESIA N/A 07/30/2023   Procedure: MRI WITH ANESTHESIA - THORACIC LUMBAR WITHOUT CONSTRAST;  Surgeon: Radiologist, Medication, MD;  Location: [facility] OR;  Service: Radiology;  Laterality: N/A;   s/p ovary cyst     s/p right knee arthroscopy     Dr. Josefina ortho   No family history of bleeding disorders, wound healing problems or difficulty with anesthesia.  Social Connections: Socially Isolated (09/01/2024)   Social Connection and Isolation Panel    Frequency of Communication with Friends and Family: More than three times a week    Frequency of Social Gatherings with Friends and Family: Once a week    Attends Religious Services: Never    Database administrator or Organizations: No    Attends Banker Meetings: Never  Marital Status: Divorced    Current Outpatient Medications:    cholecalciferol  (VITAMIN D3) 25 MCG (1000 UNIT) tablet, Take 1,000 Units by mouth daily., Disp: , Rfl:    clonazePAM  (KLONOPIN ) 1 MG tablet, Take 1 tablet (1 mg total) by mouth 2 (two) times daily as needed for up to 5 days. for anxiety, Disp: 10 tablet, Rfl: 0   cyanocobalamin  1000 MCG tablet, Take 1 tablet  (1,000 mcg total) by mouth daily., Disp: 30 tablet, Rfl: 0   diclofenac  Sodium (VOLTAREN ) 1 % GEL, Apply 4 g topically 4 (four) times daily., Disp: 20 g, Rfl: 1   famotidine  (PEPCID ) 40 MG tablet, Take 40 mg by mouth at bedtime., Disp: , Rfl:    feeding supplement (ENSURE PLUS HIGH PROTEIN) LIQD, Take 237 mLs by mouth 2 (two) times daily between meals., Disp: 14220 mL, Rfl: 2   fluticasone  (FLONASE ) 50 MCG/ACT nasal spray, Place 1 spray into both nostrils daily., Disp: , Rfl:    methocarbamol  (ROBAXIN ) 500 MG tablet, Take 1 tablet (500 mg total) by mouth every 6 (six) hours as needed for muscle spasms., Disp: 20 tablet, Rfl: 0   metolazone  (ZAROXOLYN ) 2.5 MG tablet, Take 2.5 mg by mouth once a week., Disp: , Rfl:    metoprolol  succinate (TOPROL -XL) 25 MG 24 hr tablet, Take 1 tablet (25 mg total) by mouth daily., Disp: 30 tablet, Rfl: 0   NARCAN  4 MG/0.1ML LIQD nasal spray kit, Place 0.4 mg into the nose daily as needed (opioid overdose)., Disp: , Rfl:    nystatin  (MYCOSTATIN /NYSTOP ) powder, Apply 1 Application topically daily., Disp: , Rfl:    ondansetron  (ZOFRAN -ODT) 4 MG disintegrating tablet, Take 4 mg by mouth every 8 (eight) hours as needed for nausea or vomiting (dissolve the tablet orally)., Disp: , Rfl:    pantoprazole  (PROTONIX ) 40 MG tablet, TAKE ONE TABLET BY MOUTH DAILY (Patient taking differently: Take 40 mg by mouth daily before breakfast.), Disp: 90 tablet, Rfl: 2   polyethylene glycol (MIRALAX  / GLYCOLAX ) 17 g packet, Take 17 g by mouth daily., Disp: 14 each, Rfl: 0   potassium chloride  SA (KLOR-CON  M) 20 MEQ tablet, Take 40 mEq by mouth daily., Disp: , Rfl:    pramipexole  (MIRAPEX ) 0.5 MG tablet, Take 0.5 mg by mouth 3 (three) times daily., Disp: , Rfl:    senna-docusate (SENOKOT-S) 8.6-50 MG tablet, Take 2 tablets by mouth 2 (two) times daily., Disp: , Rfl:    tirzepatide (MOUNJARO) 15 MG/0.5ML Pen, Inject 15 mg into the skin every Wednesday., Disp: , Rfl:    torsemide  (DEMADEX ) 20  MG tablet, Take 1 tablet (20 mg total) by mouth daily. Hold if SBP is less than 100. Keep MAP >75 MM HG., Disp: , Rfl:    traZODone  (DESYREL ) 50 MG tablet, Take 0.5 tablets (25 mg total) by mouth at bedtime as needed for up to 20 days for sleep., Disp: 10 tablet, Rfl: 0   albuterol  (VENTOLIN  HFA) 108 (90 Base) MCG/ACT inhaler, Inhale 1 puff into the lungs every 6 (six) hours as needed for wheezing or shortness of breath. (Patient not taking: Reported on 10/04/2024), Disp: , Rfl:  A complete ROS was performed with pertinent positives/negatives noted in the HPI. The remainder of the ROS are negative.   Physical Exam:  BP 124/68 (BP Location: Right Arm, Patient Position: Sitting, Cuff Size: Normal)   Pulse 66   Temp 98 F (36.7 C) (Oral)   Wt 281 lb (127.5 kg)   SpO2 97%  BMI 49.78 kg/m  General: Well developed, well nourished. No acute distress. Voice normal Head/Face: Normocephalic. No sinus tenderness. Facial nerve intact and equal bilaterally. No facial lacerations. Left pre-auricular mass approximately 1 cm, mobile and non tender with no overlying skin changes Eyes: PERRL, no scleral icterus or conjunctival hemorrhage. EOMI. Ears: No gross deformity. Normal external canal. Tympanic membrane in tact bilaterally Hearing: Normal speech reception.  Nose: No gross deformity or lesions. No purulent discharge. No turbinate hypertrophy. Mouth/Oropharynx: Lips without any lesions. Dentition good. No mucosal lesions within the oropharynx. No tonsillar enlargement, exudate, or lesions. Pharyngeal walls symmetrical. Uvula midline. Tongue midline without lesions. Larynx: See TFL if applicable Nasopharynx: See TFL if applicable Neck: Trachea midline. No masses. No thyromegaly or nodules palpated. No crepitus. Left neck incision healing well.  Lymphatic: No lymphadenopathy in the neck. Respiratory: No stridor or distress. Room air. Cardiovascular: Regular rate and rhythm. Extremities: No edema or  cyanosis. Warm and well-perfused. Skin: No scars or lesions on face or neck. Neurologic: CN II-XII grossly intact. Moving all extremities without gross abnormality. Other:  Independent Review of Additional Tests or Records: MRI Brain 09/09/2024 - left superficial parotid mass without surrounding inflammatory changes  Procedures: None  Impression & Plans: Maria [name] is a 68 y.o. 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 Patient without any associated symptoms.  Left parotid mass - CT neck with IV contrast ordered - US  guided FNA ordered  Follow-up after imaging and biopsy.  Adah Malkin, DO [facility]

## 2024-10-06 ENCOUNTER — Ambulatory Visit (HOSPITAL_COMMUNITY)

## 2024-10-20 ENCOUNTER — Ambulatory Visit (HOSPITAL_COMMUNITY)

## 2024-10-24 ENCOUNTER — Encounter (HOSPITAL_COMMUNITY): Payer: Self-pay

## 2024-10-24 ENCOUNTER — Emergency Department (EMERGENCY_DEPARTMENT_HOSPITAL)

## 2024-10-24 ENCOUNTER — Emergency Department (HOSPITAL_COMMUNITY)
Admission: EM | Admit: 2024-10-24 | Discharge: 2024-10-24 | Disposition: A | Discharge status: Home / Self Care | Attending: Emergency Medicine | Admitting: Emergency Medicine

## 2024-10-24 ENCOUNTER — Emergency Department (HOSPITAL_COMMUNITY)

## 2024-10-24 ENCOUNTER — Other Ambulatory Visit: Payer: Self-pay

## 2024-10-24 DIAGNOSIS — I509 Heart failure, unspecified: Secondary | ICD-10-CM | POA: Diagnosis not present

## 2024-10-24 DIAGNOSIS — M79605 Pain in left leg: Secondary | ICD-10-CM | POA: Diagnosis present

## 2024-10-24 DIAGNOSIS — R600 Localized edema: Secondary | ICD-10-CM

## 2024-10-24 DIAGNOSIS — E1122 Type 2 diabetes mellitus with diabetic chronic kidney disease: Secondary | ICD-10-CM | POA: Diagnosis not present

## 2024-10-24 DIAGNOSIS — E876 Hypokalemia: Secondary | ICD-10-CM | POA: Insufficient documentation

## 2024-10-24 DIAGNOSIS — N189 Chronic kidney disease, unspecified: Secondary | ICD-10-CM | POA: Diagnosis not present

## 2024-10-24 DIAGNOSIS — Z91040 Latex allergy status: Secondary | ICD-10-CM | POA: Insufficient documentation

## 2024-10-24 DIAGNOSIS — I130 Hypertensive heart and chronic kidney disease with heart failure and stage 1 through stage 4 chronic kidney disease, or unspecified chronic kidney disease: Secondary | ICD-10-CM | POA: Insufficient documentation

## 2024-10-24 DIAGNOSIS — R609 Edema, unspecified: Secondary | ICD-10-CM | POA: Diagnosis not present

## 2024-10-24 DIAGNOSIS — M7989 Other specified soft tissue disorders: Secondary | ICD-10-CM | POA: Diagnosis not present

## 2024-10-24 DIAGNOSIS — D649 Anemia, unspecified: Secondary | ICD-10-CM | POA: Insufficient documentation

## 2024-10-24 LAB — CBC WITH DIFFERENTIAL/PLATELET
Abs Immature Granulocytes: 0.06 K/uL (ref 0.00–0.07)
Basophils Absolute: 0 K/uL (ref 0.0–0.1)
Basophils Relative: 0 %
Eosinophils Absolute: 0.2 K/uL (ref 0.0–0.5)
Eosinophils Relative: 2 %
HCT: 31.7 % — ABNORMAL LOW (ref 36.0–46.0)
Hemoglobin: 9.9 g/dL — ABNORMAL LOW (ref 12.0–15.0)
Immature Granulocytes: 1 %
Lymphocytes Relative: 26 %
Lymphs Abs: 2.3 K/uL (ref 0.7–4.0)
MCH: 28.9 pg (ref 26.0–34.0)
MCHC: 31.2 g/dL (ref 30.0–36.0)
MCV: 92.4 fL (ref 80.0–100.0)
Monocytes Absolute: 0.7 K/uL (ref 0.1–1.0)
Monocytes Relative: 8 %
Neutro Abs: 5.4 K/uL (ref 1.7–7.7)
Neutrophils Relative %: 63 %
Platelets: 240 K/uL (ref 150–400)
RBC: 3.43 MIL/uL — ABNORMAL LOW (ref 3.87–5.11)
RDW: 13.3 % (ref 11.5–15.5)
WBC: 8.6 K/uL (ref 4.0–10.5)
nRBC: 0 % (ref 0.0–0.2)

## 2024-10-24 LAB — COMPREHENSIVE METABOLIC PANEL WITH GFR
ALT: 14 U/L (ref 0–44)
AST: 19 U/L (ref 15–41)
Albumin: 3.7 g/dL (ref 3.5–5.0)
Alkaline Phosphatase: 102 U/L (ref 38–126)
Anion gap: 10 (ref 5–15)
BUN: 15 mg/dL (ref 8–23)
CO2: 29 mmol/L (ref 22–32)
Calcium: 8.7 mg/dL — ABNORMAL LOW (ref 8.9–10.3)
Chloride: 101 mmol/L (ref 98–111)
Creatinine, Ser: 1.06 mg/dL — ABNORMAL HIGH (ref 0.44–1.00)
GFR, Estimated: 57 mL/min — ABNORMAL LOW (ref >60)
Glucose, Bld: 91 mg/dL (ref 70–99)
Potassium: 3 mmol/L — ABNORMAL LOW (ref 3.5–5.1)
Sodium: 140 mmol/L (ref 135–145)
Total Bilirubin: 0.9 mg/dL (ref 0.0–1.2)
Total Protein: 6.3 g/dL — ABNORMAL LOW (ref 6.5–8.1)

## 2024-10-24 LAB — D-DIMER, QUANTITATIVE: D-Dimer, Quant: 0.64 ug{FEU}/mL — ABNORMAL HIGH (ref 0.00–0.50)

## 2024-10-24 LAB — PROTIME-INR
INR: 1.2 (ref 0.8–1.2)
Prothrombin Time: 15.5 s — ABNORMAL HIGH (ref 11.4–15.2)

## 2024-10-24 LAB — PRO BRAIN NATRIURETIC PEPTIDE: Pro Brain Natriuretic Peptide: 151 pg/mL (ref <300.0)

## 2024-10-24 MED ORDER — OXYCODONE HCL 5 MG PO TABS
5.0000 mg | ORAL_TABLET | Freq: Once | ORAL | Status: AC
  Administered 2024-10-24: 5 mg via ORAL
  Filled 2024-10-24: qty 1

## 2024-10-24 MED ORDER — OXYCODONE HCL 5 MG PO TABS
10.0000 mg | ORAL_TABLET | Freq: Once | ORAL | Status: AC
  Administered 2024-10-24: 10 mg via ORAL
  Filled 2024-10-24: qty 2

## 2024-10-24 MED ORDER — POTASSIUM CHLORIDE CRYS ER 20 MEQ PO TBCR
40.0000 meq | EXTENDED_RELEASE_TABLET | Freq: Once | ORAL | Status: AC
  Administered 2024-10-24: 40 meq via ORAL
  Filled 2024-10-24: qty 2

## 2024-10-24 MED ORDER — FUROSEMIDE 10 MG/ML IJ SOLN
60.0000 mg | Freq: Once | INTRAMUSCULAR | Status: AC
  Administered 2024-10-24: 60 mg via INTRAVENOUS
  Filled 2024-10-24: qty 8

## 2024-10-24 NOTE — Discharge Instructions (Addendum)
 Continue your diuretic medications.  Follow-up with your doctors to be rechecked.  Return to the ER for fevers shortness of breath or other concerning symptoms

## 2024-10-24 NOTE — ED Notes (Signed)
 When getting patient undressed to place on purewick, patient had extensive noticeable rash under the panniculus with moisture. It spread across from hip to hip under skin. Patient also had redness around belly button where belly touches. Patient has known hx of same and says she uses nystatin  at home. Wiped area and applied moisture barrier cream on areas.

## 2024-10-24 NOTE — ED Triage Notes (Signed)
 BIBA from home, PT complaints of bilatteral knee pain for one month, and leg swelling more than normal.  Reports she takes Lasix  as prescribed nut the swelling has gotten worse.  Denies recent falls  132/70 HR 94 98% RA 18 RR 141 CBG

## 2024-10-24 NOTE — ED Notes (Signed)
 Recollected Blue top sent to lab

## 2024-10-24 NOTE — ED Notes (Signed)
 Sent lab 2 Lt green, Lav, and Blue top

## 2024-10-24 NOTE — ED Provider Notes (Signed)
 [place] EMERGENCY DEPARTMENT AT [facility] Provider Note   CSN: [id number] Arrival date & time: 10/24/24  0354     Patient presents with: Knee Pain and Leg Swelling   Maria [name] is a 68 y.o. female.   The history is provided by the patient and medical records.  Knee Pain Maria [name] is a 68 y.o. female who presents to the Emergency Department complaining of leg pain.  She presents to the emergency department by EMS for evaluation of bilateral leg pain, left greater than right from the knees to the feet.  She has chronic issues with pain and swelling in her legs but states over the last 3 days symptoms have worsened.  No associated fever, chest pain, shortness of breath.  She is compliant with her home torsemide .  She does report a 5 pound weight gain over the last several days.  She has a history of hypertension, CKD, CHF She is status post cervical decompression in September of this year.    Prior to Admission medications   Medication Sig Start Date End Date Taking? Authorizing Provider  albuterol  (VENTOLIN  HFA) 108 (90 Base) MCG/ACT inhaler Inhale 1 puff into the lungs every 6 (six) hours as needed for wheezing or shortness of breath. Patient not taking: Reported on 10/04/2024    [provider]  cholecalciferol  (VITAMIN D3) 25 MCG (1000 UNIT) tablet Take 1,000 Units by mouth daily.    [provider]  clonazePAM  (KLONOPIN ) 1 MG tablet Take 1 tablet (1 mg total) by mouth 2 (two) times daily as needed for up to 5 days. for anxiety 09/12/24 10/04/24  Cherlyn Labella, MD  cyanocobalamin  1000 MCG tablet Take 1 tablet (1,000 mcg total) by mouth daily. 07/18/23   [name], MD  diclofenac  Sodium (VOLTAREN ) 1 % GEL Apply 4 g topically 4 (four) times daily. 09/12/24   Akula, Vijaya, MD  famotidine  (PEPCID ) 40 MG tablet Take 40 mg by mouth at bedtime. 10/04/23   [provider]  feeding supplement (ENSURE PLUS HIGH PROTEIN) LIQD Take 237 mLs  by mouth 2 (two) times daily between meals. 09/12/24 12/11/24  Akula, Vijaya, MD  fluticasone  (FLONASE ) 50 MCG/ACT nasal spray Place 1 spray into both nostrils daily. 12/07/22   [provider]  methocarbamol  (ROBAXIN ) 500 MG tablet Take 1 tablet (500 mg total) by mouth every 6 (six) hours as needed for muscle spasms. 09/12/24   Akula, Vijaya, MD  metolazone  (ZAROXOLYN ) 2.5 MG tablet Take 2.5 mg by mouth once a week. 08/28/24   [provider]  metoprolol  succinate (TOPROL -XL) 25 MG 24 hr tablet Take 1 tablet (25 mg total) by mouth daily. 07/18/23   [name], MD  NARCAN  4 MG/0.1ML LIQD nasal spray kit Place 0.4 mg into the nose daily as needed (opioid overdose). 04/25/19   [provider]  nystatin  (MYCOSTATIN /NYSTOP ) powder Apply 1 Application topically daily. 01/22/24   [provider]  ondansetron  (ZOFRAN -ODT) 4 MG disintegrating tablet Take 4 mg by mouth every 8 (eight) hours as needed for nausea or vomiting (dissolve the tablet orally). 09/14/23   [provider]  pantoprazole  (PROTONIX ) 40 MG tablet TAKE ONE TABLET BY MOUTH DAILY Patient taking differently: Take 40 mg by mouth daily before breakfast. 01/29/22   [name], MD  polyethylene glycol (MIRALAX  / GLYCOLAX ) 17 g packet Take 17 g by mouth daily. 09/13/24   Akula, Vijaya, MD  potassium chloride  SA (KLOR-CON  M) 20 MEQ tablet Take 40  mEq by mouth daily. 08/29/24   [provider]  pramipexole  (MIRAPEX ) 0.5 MG tablet Take 0.5 mg by mouth 3 (three) times daily. 12/16/23   [provider]  senna-docusate (SENOKOT-S) 8.6-50 MG tablet Take 2 tablets by mouth 2 (two) times daily. 09/12/24   Akula, Vijaya, MD  tirzepatide [facility]) 15 MG/0.5ML Pen Inject 15 mg into the skin every Wednesday.    [provider]  torsemide  (DEMADEX ) 20 MG tablet Take 1 tablet (20 mg total) by mouth daily. Hold if SBP is less than 100. Keep MAP >75 MM HG. 09/12/24   Cherlyn Labella, MD  traZODone   (DESYREL ) 50 MG tablet Take 0.5 tablets (25 mg total) by mouth at bedtime as needed for up to 20 days for sleep. 09/12/24 10/04/24  Akula, Vijaya, MD    Allergies: Amoxicillin -pot clavulanate, Adhesive [tape], Codeine, Crestor  [rosuvastatin  calcium ], Cyclobenzaprine , Cymbalta  [duloxetine  hcl], Gabapentin , Lexapro [escitalopram], Lyrica [pregabalin], Morphine  and codeine, Vicodin [hydrocodone-acetaminophen ], and Latex    Review of Systems  All other systems reviewed and are negative.   Updated Vital Signs BP (!) 119/40 (BP Location: Left Arm)   Pulse 79   Temp 97.8 F (36.6 C) (Oral)   Resp 18   Ht 5' 3 (1.6 m)   Wt 129.3 kg   SpO2 95%   BMI 50.49 kg/m   Physical Exam Vitals and nursing note reviewed.  Constitutional:      Appearance: She is well-developed.  HENT:     Head: Normocephalic and atraumatic.  Cardiovascular:     Rate and Rhythm: Normal rate and regular rhythm.     Heart sounds: No murmur heard. Pulmonary:     Effort: Pulmonary effort is normal. No respiratory distress.     Breath sounds: Normal breath sounds.  Abdominal:     Palpations: Abdomen is soft.     Tenderness: There is no abdominal tenderness. There is no guarding or rebound.  Musculoskeletal:        General: No tenderness.     Comments: 2+ DP pulses bilaterally.  Nonpitting edema to BLE. Chronic venous stasis changes to BLE  Skin:    General: Skin is warm and dry.  Neurological:     Mental Status: She is alert and oriented to person, place, and time.  Psychiatric:        Behavior: Behavior normal.     (all labs ordered are listed, but only abnormal results are displayed) Labs Reviewed - No data to display  EKG: None  Radiology: No results found.   Procedures   Medications Ordered in the ED - No data to display                                  Medical Decision Making Amount and/or Complexity of Data Reviewed Labs: ordered. Radiology: ordered.  Risk Prescription drug  management.   Patient with history of CHF, lymphedema, diabetes, chronic pain syndrome here for evaluation of increasing pain and swelling to bilateral legs.  She has chronic venous stasis changes to bilateral lower extremities without any evidence of secondary soft tissue infection.  BMP with hypokalemia, minimal elevation in creatinine.  Potassium was replaced orally.  CBC with mild anemia, no reported bleeding at home.  She had recent cervical surgery.  D-dimer is elevated, plan to obtain vascular ultrasound to rule out DVT.  Patient care transferred pending vascular ultrasound.  Patient did have significant diuresis with IV furosemide .  Current picture is not consistent with septic arthritis.     Final diagnoses:  None    ED Discharge Orders     None          [name], MD 10/24/24 [phone]

## 2024-10-24 NOTE — ED Notes (Signed)
 Patient was placed on purewick due to mobility issues and given lasix .

## 2024-10-24 NOTE — ED Provider Notes (Signed)
 Patient was initially seen by Dr. Ilah.  Please see her note.    Plan at shift change was to follow-up on the patient's Doppler study.  Workup otherwise unremarkable  Doppler study does not show any evidence of DVT.  Symptoms most likely related to her history of CHF.  Patient was given dose of Lasix  in the ED   Randol Simmonds, MD 10/24/24 [phone]

## 2024-10-25 ENCOUNTER — Ambulatory Visit (HOSPITAL_COMMUNITY)

## 2024-10-26 ENCOUNTER — Emergency Department (HOSPITAL_COMMUNITY)
Admission: EM | Admit: 2024-10-26 | Discharge: 2024-10-26 | Disposition: A | Discharge status: Home / Self Care | Attending: Emergency Medicine | Admitting: Emergency Medicine

## 2024-10-26 ENCOUNTER — Other Ambulatory Visit: Payer: Self-pay

## 2024-10-26 ENCOUNTER — Emergency Department (HOSPITAL_COMMUNITY)

## 2024-10-26 DIAGNOSIS — E119 Type 2 diabetes mellitus without complications: Secondary | ICD-10-CM | POA: Diagnosis not present

## 2024-10-26 DIAGNOSIS — D329 Benign neoplasm of meninges, unspecified: Secondary | ICD-10-CM

## 2024-10-26 DIAGNOSIS — R600 Localized edema: Secondary | ICD-10-CM | POA: Insufficient documentation

## 2024-10-26 DIAGNOSIS — I110 Hypertensive heart disease with heart failure: Secondary | ICD-10-CM | POA: Diagnosis not present

## 2024-10-26 DIAGNOSIS — Z91040 Latex allergy status: Secondary | ICD-10-CM | POA: Insufficient documentation

## 2024-10-26 DIAGNOSIS — M25562 Pain in left knee: Secondary | ICD-10-CM | POA: Insufficient documentation

## 2024-10-26 DIAGNOSIS — Z79899 Other long term (current) drug therapy: Secondary | ICD-10-CM | POA: Insufficient documentation

## 2024-10-26 DIAGNOSIS — M25552 Pain in left hip: Secondary | ICD-10-CM | POA: Insufficient documentation

## 2024-10-26 DIAGNOSIS — W06XXXA Fall from bed, initial encounter: Secondary | ICD-10-CM | POA: Insufficient documentation

## 2024-10-26 DIAGNOSIS — M545 Low back pain, unspecified: Secondary | ICD-10-CM | POA: Insufficient documentation

## 2024-10-26 DIAGNOSIS — I509 Heart failure, unspecified: Secondary | ICD-10-CM | POA: Insufficient documentation

## 2024-10-26 DIAGNOSIS — E876 Hypokalemia: Secondary | ICD-10-CM | POA: Insufficient documentation

## 2024-10-26 DIAGNOSIS — W19XXXA Unspecified fall, initial encounter: Secondary | ICD-10-CM

## 2024-10-26 DIAGNOSIS — M25512 Pain in left shoulder: Secondary | ICD-10-CM | POA: Diagnosis not present

## 2024-10-26 DIAGNOSIS — D320 Benign neoplasm of cerebral meninges: Secondary | ICD-10-CM | POA: Diagnosis not present

## 2024-10-26 DIAGNOSIS — D649 Anemia, unspecified: Secondary | ICD-10-CM | POA: Diagnosis not present

## 2024-10-26 LAB — BASIC METABOLIC PANEL WITH GFR
Anion gap: 10 (ref 5–15)
BUN: 18 mg/dL (ref 8–23)
CO2: 31 mmol/L (ref 22–32)
Calcium: 9.1 mg/dL (ref 8.9–10.3)
Chloride: 98 mmol/L (ref 98–111)
Creatinine, Ser: 1.26 mg/dL — ABNORMAL HIGH (ref 0.44–1.00)
GFR, Estimated: 47 mL/min — ABNORMAL LOW (ref >60)
Glucose, Bld: 93 mg/dL (ref 70–99)
Potassium: 2.9 mmol/L — ABNORMAL LOW (ref 3.5–5.1)
Sodium: 140 mmol/L (ref 135–145)

## 2024-10-26 LAB — PRO BRAIN NATRIURETIC PEPTIDE: Pro Brain Natriuretic Peptide: 96.9 pg/mL (ref <300.0)

## 2024-10-26 LAB — CBC
HCT: 34.4 % — ABNORMAL LOW (ref 36.0–46.0)
Hemoglobin: 11 g/dL — ABNORMAL LOW (ref 12.0–15.0)
MCH: 29.5 pg (ref 26.0–34.0)
MCHC: 32 g/dL (ref 30.0–36.0)
MCV: 92.2 fL (ref 80.0–100.0)
Platelets: 267 K/uL (ref 150–400)
RBC: 3.73 MIL/uL — ABNORMAL LOW (ref 3.87–5.11)
RDW: 13.3 % (ref 11.5–15.5)
WBC: 9.2 K/uL (ref 4.0–10.5)
nRBC: 0 % (ref 0.0–0.2)

## 2024-10-26 LAB — CK: Total CK: 69 U/L (ref 38–234)

## 2024-10-26 MED ORDER — POTASSIUM CHLORIDE 10 MEQ/100ML IV SOLN
10.0000 meq | INTRAVENOUS | Status: AC
  Administered 2024-10-26 (×2): 10 meq via INTRAVENOUS
  Filled 2024-10-26 (×2): qty 100

## 2024-10-26 MED ORDER — POTASSIUM CHLORIDE CRYS ER 20 MEQ PO TBCR
40.0000 meq | EXTENDED_RELEASE_TABLET | Freq: Once | ORAL | Status: AC
  Administered 2024-10-26: 40 meq via ORAL
  Filled 2024-10-26: qty 2

## 2024-10-26 MED ORDER — HYDROMORPHONE HCL 1 MG/ML IJ SOLN
1.0000 mg | Freq: Once | INTRAMUSCULAR | Status: AC
  Administered 2024-10-26: 1 mg via INTRAVENOUS
  Filled 2024-10-26: qty 1

## 2024-10-26 NOTE — ED Notes (Signed)
 [facility] called for transport

## 2024-10-26 NOTE — ED Provider Notes (Signed)
 Physical Exam  BP (!) 108/54 (BP Location: Left Wrist)   Pulse 80   Temp 97.7 F (36.5 C) (Oral)   Resp 15   SpO2 96%   Physical Exam Vitals and nursing note reviewed.  Constitutional:      General: She is awake. She is not in acute distress.    Appearance: She is obese. She is not ill-appearing, toxic-appearing or diaphoretic.     Comments: Chronically ill-appearing, patient sitting up comfortably eating food  HENT:     Head: Normocephalic and atraumatic.     Comments: No raccoon eyes, no battles sign Eyes:     General: No scleral icterus. Pulmonary:     Effort: Pulmonary effort is normal. No respiratory distress.  Musculoskeletal:     Cervical back: Normal range of motion. No tenderness.     Right lower leg: Edema present.     Left lower leg: Edema present.     Comments: Tenderness with palpation of L shoulder and L knee  Patient ambulatory with walker down hall  Bilateral lower extremities neurovascularly intact   Skin:    General: Skin is warm.     Capillary Refill: Capillary refill takes less than 2 seconds.     Comments: Bilateral lower extremity edema present with color changes consistent with venous stasis dermatitis  Neurological:     General: No focal deficit present.     Mental Status: She is alert and oriented to person, place, and time.  Psychiatric:        Mood and Affect: Mood normal.        Behavior: Behavior normal. Behavior is cooperative.      Procedures  Procedures  ED Course / MDM   Clinical Course as of 10/26/24 1518  Thu Oct 26, 2024  1505 Obese, lower extremity edema, hypokalemia, pain and hard time walking, walks very little with walker at baseline  Get patient up and walking, if can walk with her walker can go home [CH]    Clinical Course User Index [CH] Kerim Statzer, Terrall FALCON, PA-C   Medical Decision Making Amount and/or Complexity of Data Reviewed Labs: ordered. Radiology: ordered.  Risk Prescription drug  management.   Patient signed out to myself at time of shift change by previous provider Sherlean Carota PA-C, his note for further detail.  Briefly, patient is a 68 year old female with a past medical history significant for hypertension, GERD, morbid obesity, chronic low back pain, CHF, diabetes who presented to the emergency department for a chief complaint of fall out of bed onto the floor.  Patient states that she laid on the floor for approximately 2 hours.  Eventually maintenance and fire department services were called and had to break and to the dwelling.  Patient endorses left knee pain, left hip pain, left shoulder pain.  Denies head or neck injury.  Patient states that she did have 1 episode of hallucinations last night and has hallucinated previously due to medications, currently denies hallucinations, SI, or HI.  At this time workup significant for lower extremity edema bilaterally, hypokalemia, as well as a mild AKI.  Fluids were not given due to significant lower extremity swelling.  Potassium was replaced.  Otherwise imaging and lab work is reassuring.  Plan currently is to attempt to get the patient up and moving, if able to walk with walker which is her baseline patient may be discharged. Per report from nursing patient was able to ambulate down the hallway with walker which is her baseline. On  exam I believe the color changes on bilateral lower extremities to be consistent with venous stasis dermatitis as patient states her lower extremities have been red for some time, doubt cellulitis due to bilateral nature no fever or chills.   Based on reassuring reassessment, negative imaging, patient discharged.  Patient made aware of findings on CT head and instructed to make neurosurgeon as well as primary care provider aware.       Yaasir Menken F, PA-C 10/27/24 0114    Simon Lavonia SAILOR, MD 10/27/24 [phone]

## 2024-10-26 NOTE — ED Triage Notes (Addendum)
 Patient called Maria [name]. Per medics patient fell out of the bed and was on the floor for 2hrs. Patient called maintenance and then she called Maria [name] and fire department broke in her door. Patient c/o bilateral leg pain and back pain. Patient c/o left knee pain. Patient has bilateral edema to her legs. Patient alert and oriented x 4 but then she will start to say stuff that doesn't make sense per medic. Patient is on medication for her restless legs that makes her hallucinate. Patient did not hit her head, no blood thinners or LOC. No other complaints at this time. Bp with medic 142 palpated Pulse 110 palpated 18Resp 105 BS

## 2024-10-26 NOTE — ED Provider Notes (Signed)
 [place] [facility] AT [facility] Provider Note   CSN: [id number] Arrival date & time: 10/26/24  9380     Patient presents with: No chief complaint on file.   Maria [name] is a 68 y.o. female with past medical history significant for hypertension, GERD, morbid obesity, chronic low back pain, CHF, diabetes who presents with concern for fall out of bed onto the floor.  He endorses laying on the floor for around 2 hours.  Called maintenance and fire department had the break-in.  She endorses some left knee, left hip pain, left shoulder pain.  She denies hitting her head.  She reports that she will occasionally speak out of her head, on medication for restless legs that caused her to intermittently hallucinate, she reports that she has also been having poor sleep.  She denies any other confusion and she is oriented and sensible during my exam.   HPI     Prior to Admission medications   Medication Sig Start Date End Date Taking? Authorizing Provider  albuterol  (VENTOLIN  HFA) 108 (90 Base) MCG/ACT inhaler Inhale 1 puff into the lungs every 6 (six) hours as needed for wheezing or shortness of breath. Patient not taking: Reported on 10/04/2024    [provider]  cholecalciferol  (VITAMIN D3) 25 MCG (1000 UNIT) tablet Take 1,000 Units by mouth daily.    [provider]  clonazePAM  (KLONOPIN ) 1 MG tablet Take 1 tablet (1 mg total) by mouth 2 (two) times daily as needed for up to 5 days. for anxiety 09/12/24 10/04/24  Cherlyn Labella, MD  cyanocobalamin  1000 MCG tablet Take 1 tablet (1,000 mcg total) by mouth daily. 07/18/23   [name], MD  diclofenac  Sodium (VOLTAREN ) 1 % GEL Apply 4 g topically 4 (four) times daily. 09/12/24   Akula, Vijaya, MD  famotidine  (PEPCID ) 40 MG tablet Take 40 mg by mouth at bedtime. 10/04/23   [provider]  feeding supplement (ENSURE PLUS HIGH PROTEIN) LIQD Take 237 mLs by mouth 2 (two) times daily between  meals. 09/12/24 12/11/24  Akula, Vijaya, MD  fluticasone  (FLONASE ) 50 MCG/ACT nasal spray Place 1 spray into both nostrils daily. 12/07/22   [provider]  methocarbamol  (ROBAXIN ) 500 MG tablet Take 1 tablet (500 mg total) by mouth every 6 (six) hours as needed for muscle spasms. 09/12/24   Akula, Vijaya, MD  metolazone  (ZAROXOLYN ) 2.5 MG tablet Take 2.5 mg by mouth once a week. 08/28/24   [provider]  metoprolol  succinate (TOPROL -XL) 25 MG 24 hr tablet Take 1 tablet (25 mg total) by mouth daily. 07/18/23   [name], MD  NARCAN  4 MG/0.1ML LIQD nasal spray kit Place 0.4 mg into the nose daily as needed (opioid overdose). 04/25/19   [provider]  nystatin  (MYCOSTATIN /NYSTOP ) powder Apply 1 Application topically daily. 01/22/24   [provider]  ondansetron  (ZOFRAN -ODT) 4 MG disintegrating tablet Take 4 mg by mouth every 8 (eight) hours as needed for nausea or vomiting (dissolve the tablet orally). 09/14/23   [provider]  pantoprazole  (PROTONIX ) 40 MG tablet TAKE ONE TABLET BY MOUTH DAILY Patient taking differently: Take 40 mg by mouth daily before breakfast. 01/29/22   [name], MD  polyethylene glycol (MIRALAX  / GLYCOLAX ) 17 g packet Take 17 g by mouth daily. 09/13/24   Akula, Vijaya, MD  potassium chloride  SA (KLOR-CON  M) 20 MEQ tablet Take 40 mEq by mouth daily. 08/29/24   [provider]  pramipexole  (MIRAPEX )  0.5 MG tablet Take 0.5 mg by mouth 3 (three) times daily. 12/16/23   [provider]  senna-docusate (SENOKOT-S) 8.6-50 MG tablet Take 2 tablets by mouth 2 (two) times daily. 09/12/24   Akula, Vijaya, MD  tirzepatide [facility]) 15 MG/0.5ML Pen Inject 15 mg into the skin every Wednesday.    [provider]  torsemide  (DEMADEX ) 20 MG tablet Take 1 tablet (20 mg total) by mouth daily. Hold if SBP is less than 100. Keep MAP >75 MM HG. 09/12/24   Cherlyn Labella, MD  traZODone  (DESYREL ) 50 MG tablet Take 0.5 tablets  (25 mg total) by mouth at bedtime as needed for up to 20 days for sleep. 09/12/24 10/04/24  Akula, Vijaya, MD    Allergies: Amoxicillin -pot clavulanate, Adhesive [tape], Codeine, Crestor  [rosuvastatin  calcium ], Cyclobenzaprine , Cymbalta  [duloxetine  hcl], Gabapentin , Lexapro [escitalopram], Lyrica [pregabalin], Morphine  and codeine, Vicodin [hydrocodone-acetaminophen ], and Latex    Review of Systems  All other systems reviewed and are negative.   Updated Vital Signs BP (!) 108/54 (BP Location: Left Wrist)   Pulse 80   Temp 97.7 F (36.5 C) (Oral)   Resp 15   SpO2 96%   Physical Exam Vitals and nursing note reviewed.  Constitutional:      General: She is not in acute distress.    Appearance: Normal appearance. She is obese.  HENT:     Head: Normocephalic and atraumatic.  Eyes:     General:        Right eye: No discharge.        Left eye: No discharge.  Cardiovascular:     Rate and Rhythm: Normal rate and regular rhythm.     Heart sounds: No murmur heard.    No friction rub. No gallop.  Pulmonary:     Effort: Pulmonary effort is normal.     Breath sounds: Normal breath sounds.  Abdominal:     General: Bowel sounds are normal.     Palpations: Abdomen is soft.  Musculoskeletal:     Comments: Bilateral lower extremity edema, pitting 2-3+, diffuse redness consistent with venous stasis dermatitis vs less likely bilateral cellulitis  Mild tenderness palpation left hip, left shoulder, left knee, no obvious step-off, deformity.  No midline spinal tenderness throughout, some mild left lumbar paraspinous muscle tenderness  Skin:    General: Skin is warm and dry.     Capillary Refill: Capillary refill takes less than 2 seconds.  Neurological:     Mental Status: She is alert and oriented to person, place, and time.  Psychiatric:        Mood and Affect: Mood normal.        Behavior: Behavior normal.     (all labs ordered are listed, but only abnormal results are displayed) Labs  Reviewed  CBC - Abnormal; Notable for the following components:      Result Value   RBC 3.73 (*)    Hemoglobin 11.0 (*)    HCT 34.4 (*)    All other components within normal limits  BASIC METABOLIC PANEL WITH GFR - Abnormal; Notable for the following components:   Potassium 2.9 (*)    Creatinine, Ser 1.26 (*)    GFR, Estimated 47 (*)    All other components within normal limits  PRO BRAIN NATRIURETIC PEPTIDE  CK    EKG: None  Radiology: CT Head Wo Contrast Result Date: 10/26/2024 EXAM: CT HEAD WITHOUT CONTRAST 10/26/2024 01:13:52 PM TECHNIQUE: CT of the head was performed without the administration of intravenous contrast.  Automated exposure control, iterative reconstruction, and/or weight based adjustment of the mA/kV was utilized to reduce the radiation dose to as low as reasonably achievable. COMPARISON: 12/10/2023 CLINICAL HISTORY: Mental status change, unknown cause FINDINGS: BRAIN AND VENTRICLES: No acute hemorrhage. No evidence of acute infarct. No hydrocephalus. No extra-axial collection. No mass effect or midline shift. Stable 7 mm calcified meningioma along left frontal convexity. ORBITS: No acute abnormality. SABRA SINUSES: No acute abnormality. SOFT TISSUES AND SKULL: No acute soft tissue abnormality. No skull fracture. IMPRESSION: 1. No acute intracranial abnormality. 2. Stable calcified left frontal convexity meningioma measuring 7 mm. Electronically signed by: Lonni Necessary MD 10/26/2024 01:38 PM EST RP Workstation: [id number]   DG Knee Complete 4 Views Left Result Date: 10/26/2024 EXAM: 4 OR MORE VIEW(S) XRAY OF THE LEFT KNEE 10/26/2024 07:40:00 AM COMPARISON: 09/11/2024 CLINICAL HISTORY: fall FINDINGS: BONES AND JOINTS: No acute fracture. No focal osseous lesion. No joint dislocation. No significant joint effusion. Moderate narrowing of medial joint space is noted. Mild osteophyte formation is noted laterally. Mild patellar spurring. SOFT TISSUES: The soft tissues are  unremarkable. IMPRESSION: 1. No acute fracture or dislocation. 2. Osteoarthritis with moderate medial compartment joint space narrowing, mild lateral osteophyte formation, and mild patellar spurring. Electronically signed by: Lynwood Seip MD 10/26/2024 07:51 AM EST RP Workstation: [id number]   DG Shoulder Left Result Date: 10/26/2024 EXAM: 1 VIEW XRAY OF THE LEFT SHOULDER 10/26/2024 07:40:00 AM COMPARISON: 06/07/2020 CLINICAL HISTORY: fall fall FINDINGS: BONES AND JOINTS: Mild degenerative changes are seen involving the left acromioclavicular and glenohumeral joints. Glenohumeral joint is normally aligned. No acute fracture or dislocation. SOFT TISSUES: No abnormal calcifications. Visualized lung is unremarkable. IMPRESSION: 1. No acute fracture or dislocation. 2. Mild osteoarthrosis of the left acromioclavicular and glenohumeral joints. Electronically signed by: Lynwood Seip MD 10/26/2024 07:49 AM EST RP Workstation: [id number]   DG Hip Unilat W or Wo Pelvis 2-3 Views Left Result Date: 10/26/2024 EXAM: 2 or 3 VIEW(S) XRAY OF THE LEFT HIP 10/26/2024 07:40:00 AM COMPARISON: 01/30/2024 CLINICAL HISTORY: fall fall FINDINGS: BONES AND JOINTS: No acute fracture or focal osseous lesion. The hip joint is maintained. Mild osteophyte formation of the Left hip is noted. SOFT TISSUES: The soft tissues are unremarkable. IMPRESSION: 1. No acute fracture or dislocation. 2. Mild osteophyte formation of the left hip. Electronically signed by: Lynwood Seip MD 10/26/2024 07:47 AM EST RP Workstation: [id number]     Procedures   Medications Ordered in the ED  HYDROmorphone  (DILAUDID ) injection 1 mg (1 mg Intravenous Given 10/26/24 0937)  potassium chloride  SA (KLOR-CON  M) CR tablet 40 mEq (40 mEq Oral Given 10/26/24 0937)  potassium chloride  10 mEq in 100 mL IVPB (10 mEq Intravenous New Bag/Given 10/26/24 1145)  HYDROmorphone  (DILAUDID ) injection 1 mg (1 mg Intravenous Given 10/26/24 1416)    Clinical Course as of 10/26/24  1508  Thu Oct 26, 2024  1505 Obese, lower extremity edema, hypokalemia, pain and hard time walking, walks very little with walker at baseline  Get patient up and walking, if can walk with her walker can go home [CH]    Clinical Course User Index [CH] Hinnant, Terrall FALCON, PA-C                                 Medical Decision Making Amount and/or Complexity of Data Reviewed Labs: ordered. Radiology: ordered.  Risk Prescription drug management.   This patient is a 68 y.o. female  who  presents to the ED for concern of fall, hip pain, left knee pain, left shoulder pain, weakness.   Differential diagnoses prior to evaluation: The emergent differential diagnosis includes, but is not limited to,  CVA, spinal cord injury, ACS, arrhythmia, syncope, orthostatic hypotension, sepsis, hypoglycemia, hypoxia, electrolyte disturbance, endocrine disorder, anemia, environmental exposure, polypharmacy -- fractures, dislocations, bruising, vs other . This is not an exhaustive differential.   Past Medical History / Co-morbidities / Social History: hypertension, GERD, morbid obesity, chronic low back pain, CHF, diabetes  Additional history: Chart reviewed. Pertinent results include: Reviewed lab work, imaging from recent previous emergency department evaluation, notably was just evaluated  Physical Exam: Physical exam performed. The pertinent findings include: Bilateral lower extremity edema, pitting 2-3+, diffuse redness consistent with venous stasis dermatitis vs less likely bilateral cellulitis  Mild tenderness palpation left hip, left shoulder, left knee, no obvious step-off, deformity.  No midline spinal tenderness throughout, some mild left lumbar paraspinous muscle tenderness  Vital signs stable  Morbid obesity  Lab Tests/Imaging studies: I personally interpreted labs/imaging and the pertinent results include: CBC notable for mild anemia, hemoglobin 11, stable to slightly improved from  recent baseline.  Plain film radiograph of left knee, left shoulder, left hip with no fracture, dislocation.  Normal CK, normal BNP, BMP notable for hypokalemia potassium 2.9, mildly elevated creatinine at 1.26.  I agree with the radiologist interpretation.  Cardiac monitoring: EKG obtained and interpreted by myself and attending physician which shows: Normal sinus rhythm, no acute ST ST changes   Medications: I ordered medication including potassium for hypokalemia, Dilaudid  for pain.  I have reviewed the patients home medicines and have made adjustments as needed.   Disposition: After consideration of the diagnostic results and the patients response to treatment, I feel that patient attempted ambulation,.   3:08 PM Care of Tawania A Krahl transferred to [facility] and Dr. Simon at the end of my shift as the patient will require reassessment once labs/imaging have resulted. Patient presentation, ED course, and plan of care discussed with review of all pertinent labs and imaging. Please see his/her note for further details regarding further ED course and disposition. Plan at time of handoff is reassess ability to ambulate, if unable to ambulate repeat discussion about possible rehab placement versus possibility for soft admission for mild AKI, hypokalemia. This may be altered or completely changed at the discretion of the oncoming team pending results of further workup.   Final diagnoses:  Fall, initial encounter  Acute bilateral low back pain without sciatica  Acute pain of left shoulder  Lower extremity edema    ED Discharge Orders     None          Rosan Sherlean VEAR DEVONNA 10/26/24 1508    Suzette Pac, MD 10/27/24 [phone]

## 2024-10-26 NOTE — Discharge Instructions (Addendum)
 It was a pleasure taking care of you today.  Based on your history, physical exam, labs, as well as imaging I feel you are safe for discharge.  Today the imaging that we completed was reassuring.  It did not show any acute injuries related to your fall.  On the CT scan of your head there is mention of a stable 7mm meningioma, please make your primary care provider as well as neurosurgery aware of this finding as soon as possible. Continue taking your home diuretic, as well as pain medication.  Follow-up closely with your primary care doctor if you continue having nighttime hallucinations to discuss possible medication adjustments.  Today your potassium was slightly low so it was replaced while in the hospital, please make your primary care provider aware of this as well.  It might be a good idea in approximately 1 week to have your potassium levels rechecked.  If experiencing the following symptoms including but not limited to fever, chills, hallucinations, unexplained weakness, severe pain, chest pain, shortness of breath, worsening leg swelling, or other concerning symptom please return to the emergency department or seek further medical care.  If symptoms worsen recommend follow-up with your primary care provider or back to the emergency department within 48 hours.  Recommend follow-up within 1 week with your PCP for re-check of symptoms and potassium. Please make your primary care provider aware of all work-up and findings today.

## 2024-10-26 NOTE — ED Notes (Signed)
 Inc care, boosted up in bed. All needs met.

## 2024-10-28 ENCOUNTER — Emergency Department (HOSPITAL_COMMUNITY)
Admission: EM | Admit: 2024-10-28 | Discharge: 2024-10-30 | Disposition: A | Attending: Emergency Medicine | Admitting: Emergency Medicine

## 2024-10-28 ENCOUNTER — Other Ambulatory Visit: Payer: Self-pay

## 2024-10-28 ENCOUNTER — Emergency Department (HOSPITAL_COMMUNITY)

## 2024-10-28 DIAGNOSIS — M1612 Unilateral primary osteoarthritis, left hip: Secondary | ICD-10-CM | POA: Diagnosis not present

## 2024-10-28 DIAGNOSIS — M170 Bilateral primary osteoarthritis of knee: Secondary | ICD-10-CM | POA: Diagnosis not present

## 2024-10-28 DIAGNOSIS — M25552 Pain in left hip: Secondary | ICD-10-CM

## 2024-10-28 DIAGNOSIS — R600 Localized edema: Secondary | ICD-10-CM | POA: Insufficient documentation

## 2024-10-28 DIAGNOSIS — R442 Other hallucinations: Secondary | ICD-10-CM | POA: Diagnosis present

## 2024-10-28 DIAGNOSIS — M79605 Pain in left leg: Secondary | ICD-10-CM | POA: Insufficient documentation

## 2024-10-28 DIAGNOSIS — S0990XA Unspecified injury of head, initial encounter: Secondary | ICD-10-CM | POA: Diagnosis present

## 2024-10-28 DIAGNOSIS — I872 Venous insufficiency (chronic) (peripheral): Secondary | ICD-10-CM | POA: Insufficient documentation

## 2024-10-28 DIAGNOSIS — M79604 Pain in right leg: Secondary | ICD-10-CM | POA: Diagnosis not present

## 2024-10-28 DIAGNOSIS — M549 Dorsalgia, unspecified: Secondary | ICD-10-CM | POA: Diagnosis present

## 2024-10-28 DIAGNOSIS — M25462 Effusion, left knee: Secondary | ICD-10-CM | POA: Insufficient documentation

## 2024-10-28 DIAGNOSIS — T426X5A Adverse effect of other antiepileptic and sedative-hypnotic drugs, initial encounter: Secondary | ICD-10-CM | POA: Diagnosis not present

## 2024-10-28 DIAGNOSIS — R590 Localized enlarged lymph nodes: Secondary | ICD-10-CM | POA: Diagnosis not present

## 2024-10-28 DIAGNOSIS — M25561 Pain in right knee: Secondary | ICD-10-CM | POA: Diagnosis not present

## 2024-10-28 DIAGNOSIS — W08XXXA Fall from other furniture, initial encounter: Secondary | ICD-10-CM | POA: Diagnosis not present

## 2024-10-28 DIAGNOSIS — L8941 Pressure ulcer of contiguous site of back, buttock and hip, stage 1: Secondary | ICD-10-CM | POA: Insufficient documentation

## 2024-10-28 LAB — BASIC METABOLIC PANEL WITH GFR
Anion gap: 12 (ref 5–15)
BUN: 15 mg/dL (ref 8–23)
CO2: 29 mmol/L (ref 22–32)
Calcium: 9.7 mg/dL (ref 8.9–10.3)
Chloride: 97 mmol/L — ABNORMAL LOW (ref 98–111)
Creatinine, Ser: 1.18 mg/dL — ABNORMAL HIGH (ref 0.44–1.00)
GFR, Estimated: 50 mL/min — ABNORMAL LOW (ref >60)
Glucose, Bld: 98 mg/dL (ref 70–99)
Potassium: 2.9 mmol/L — ABNORMAL LOW (ref 3.5–5.1)
Sodium: 139 mmol/L (ref 135–145)

## 2024-10-28 LAB — URINALYSIS, W/ REFLEX TO CULTURE (INFECTION SUSPECTED)
Bilirubin Urine: NEGATIVE
Glucose, UA: NEGATIVE mg/dL
Hgb urine dipstick: NEGATIVE
Ketones, ur: NEGATIVE mg/dL
Nitrite: NEGATIVE
Protein, ur: NEGATIVE mg/dL
Specific Gravity, Urine: 1.004 — ABNORMAL LOW (ref 1.005–1.030)
pH: 6 (ref 5.0–8.0)

## 2024-10-28 LAB — CBC WITH DIFFERENTIAL/PLATELET
Abs Immature Granulocytes: 0.06 K/uL (ref 0.00–0.07)
Basophils Absolute: 0 K/uL (ref 0.0–0.1)
Basophils Relative: 0 %
Eosinophils Absolute: 0.1 K/uL (ref 0.0–0.5)
Eosinophils Relative: 1 %
HCT: 37.4 % (ref 36.0–46.0)
Hemoglobin: 11.5 g/dL — ABNORMAL LOW (ref 12.0–15.0)
Immature Granulocytes: 1 %
Lymphocytes Relative: 20 %
Lymphs Abs: 1.9 K/uL (ref 0.7–4.0)
MCH: 28.1 pg (ref 26.0–34.0)
MCHC: 30.7 g/dL (ref 30.0–36.0)
MCV: 91.4 fL (ref 80.0–100.0)
Monocytes Absolute: 0.7 K/uL (ref 0.1–1.0)
Monocytes Relative: 7 %
Neutro Abs: 6.7 K/uL (ref 1.7–7.7)
Neutrophils Relative %: 71 %
Platelets: 293 K/uL (ref 150–400)
RBC: 4.09 MIL/uL (ref 3.87–5.11)
RDW: 13.5 % (ref 11.5–15.5)
WBC: 9.4 K/uL (ref 4.0–10.5)
nRBC: 0 % (ref 0.0–0.2)

## 2024-10-28 LAB — MAGNESIUM: Magnesium: 1.9 mg/dL (ref 1.7–2.4)

## 2024-10-28 MED ORDER — PRAMIPEXOLE DIHYDROCHLORIDE 0.25 MG PO TABS
0.5000 mg | ORAL_TABLET | Freq: Three times a day (TID) | ORAL | Status: DC
  Administered 2024-10-28 – 2024-10-30 (×4): 0.5 mg via ORAL
  Filled 2024-10-28 (×6): qty 2

## 2024-10-28 MED ORDER — POTASSIUM CHLORIDE CRYS ER 20 MEQ PO TBCR
40.0000 meq | EXTENDED_RELEASE_TABLET | Freq: Every day | ORAL | Status: DC
  Administered 2024-10-29 – 2024-10-30 (×2): 40 meq via ORAL
  Filled 2024-10-28 (×2): qty 2

## 2024-10-28 MED ORDER — ONDANSETRON 4 MG PO TBDP
4.0000 mg | ORAL_TABLET | Freq: Three times a day (TID) | ORAL | Status: DC | PRN
Start: 2024-10-28 — End: 2024-10-30

## 2024-10-28 MED ORDER — FLUTICASONE PROPIONATE 50 MCG/ACT NA SUSP
1.0000 | Freq: Every day | NASAL | Status: DC
  Administered 2024-10-29 – 2024-10-30 (×2): 1 via NASAL
  Filled 2024-10-28: qty 16

## 2024-10-28 MED ORDER — FAMOTIDINE 20 MG PO TABS
40.0000 mg | ORAL_TABLET | Freq: Every day | ORAL | Status: DC
  Administered 2024-10-28 – 2024-10-29 (×2): 40 mg via ORAL
  Filled 2024-10-28 (×2): qty 2

## 2024-10-28 MED ORDER — LACTATED RINGERS IV BOLUS
1000.0000 mL | Freq: Once | INTRAVENOUS | Status: AC
  Administered 2024-10-28: 1000 mL via INTRAVENOUS

## 2024-10-28 MED ORDER — HYDROMORPHONE HCL 1 MG/ML IJ SOLN
0.5000 mg | Freq: Once | INTRAMUSCULAR | Status: AC
  Administered 2024-10-28: 0.5 mg via INTRAVENOUS
  Filled 2024-10-28 (×2): qty 1

## 2024-10-28 MED ORDER — ONDANSETRON HCL 4 MG/2ML IJ SOLN
4.0000 mg | Freq: Once | INTRAMUSCULAR | Status: AC
  Administered 2024-10-28: 4 mg via INTRAVENOUS
  Filled 2024-10-28: qty 2

## 2024-10-28 MED ORDER — POTASSIUM CHLORIDE CRYS ER 20 MEQ PO TBCR
40.0000 meq | EXTENDED_RELEASE_TABLET | Freq: Once | ORAL | Status: AC
  Administered 2024-10-28: 40 meq via ORAL
  Filled 2024-10-28: qty 2

## 2024-10-28 MED ORDER — ACETAMINOPHEN 500 MG PO TABS
1000.0000 mg | ORAL_TABLET | Freq: Once | ORAL | Status: AC
  Administered 2024-10-28: 1000 mg via ORAL
  Filled 2024-10-28: qty 2

## 2024-10-28 MED ORDER — FENTANYL CITRATE (PF) 50 MCG/ML IJ SOSY
50.0000 ug | PREFILLED_SYRINGE | Freq: Once | INTRAMUSCULAR | Status: AC
  Administered 2024-10-28: 50 ug via INTRAVENOUS
  Filled 2024-10-28: qty 1

## 2024-10-28 MED ORDER — TRAZODONE HCL 50 MG PO TABS
25.0000 mg | ORAL_TABLET | Freq: Every evening | ORAL | Status: DC | PRN
Start: 2024-10-28 — End: 2024-10-30
  Administered 2024-10-28: 25 mg via ORAL
  Filled 2024-10-28: qty 1

## 2024-10-28 MED ORDER — TORSEMIDE 20 MG PO TABS
20.0000 mg | ORAL_TABLET | Freq: Every day | ORAL | Status: DC
  Administered 2024-10-28 – 2024-10-30 (×3): 20 mg via ORAL
  Filled 2024-10-28 (×3): qty 1

## 2024-10-28 MED ORDER — METOPROLOL SUCCINATE ER 25 MG PO TB24
25.0000 mg | ORAL_TABLET | Freq: Every day | ORAL | Status: DC
  Administered 2024-10-28 – 2024-10-30 (×3): 25 mg via ORAL
  Filled 2024-10-28 (×3): qty 1

## 2024-10-28 NOTE — ED Notes (Signed)
Placed purwick on pt  

## 2024-10-28 NOTE — ED Triage Notes (Signed)
 Pt bib EMS from home. Pt c/o back pain. Pt states she slipped off the couch this morning onto left hip. Pt states she is also hallucinating and her legs are swelling more then they were. Pt states she took her lasix  this morning. Pt states she took her oxycodone  for back pain.  Cbg 140

## 2024-10-28 NOTE — ED Provider Notes (Signed)
  EMERGENCY DEPARTMENT AT [facility] Provider Note   CSN: [id number] Arrival date & time: 10/28/24  1345     Patient presents with: Back Pain   Maria [name] is a 68 y.o. female whose primary issue is that she has some back pain/left hip pain after rolling from her couch onto the floor this morning.  She states that she has had some hallucinations that this is secondary to previous pregabalin usage and this was not recent.  She also states that she feels that she has some swelling in her legs that is increased from previous, seen on 6 November for similar type symptoms, diagnosed with venous stasis dermatitis at that time.  Continues to have nausea, bilateral leg pain, decreased appetite.  She also has concerns of staying at home by herself, she does not have any caregivers at home.    Back Pain      Prior to Admission medications   Medication Sig Start Date End Date Taking? Authorizing Provider  albuterol  (VENTOLIN  HFA) 108 (90 Base) MCG/ACT inhaler Inhale 1 puff into the lungs every 6 (six) hours as needed for wheezing or shortness of breath. Patient not taking: Reported on 10/04/2024    [provider]  cholecalciferol  (VITAMIN D3) 25 MCG (1000 UNIT) tablet Take 1,000 Units by mouth daily.    [provider]  clonazePAM  (KLONOPIN ) 1 MG tablet Take 1 tablet (1 mg total) by mouth 2 (two) times daily as needed for up to 5 days. for anxiety 09/12/24 10/04/24  Cherlyn Labella, MD  cyanocobalamin  1000 MCG tablet Take 1 tablet (1,000 mcg total) by mouth daily. 07/18/23   [name], MD  diclofenac  Sodium (VOLTAREN ) 1 % GEL Apply 4 g topically 4 (four) times daily. 09/12/24   Akula, Vijaya, MD  famotidine  (PEPCID ) 40 MG tablet Take 40 mg by mouth at bedtime. 10/04/23   [provider]  feeding supplement (ENSURE PLUS HIGH PROTEIN) LIQD Take 237 mLs by mouth 2 (two) times daily between meals. 09/12/24 12/11/24  Akula, Vijaya, MD  fluticasone   (FLONASE ) 50 MCG/ACT nasal spray Place 1 spray into both nostrils daily. 12/07/22   [provider]  methocarbamol  (ROBAXIN ) 500 MG tablet Take 1 tablet (500 mg total) by mouth every 6 (six) hours as needed for muscle spasms. 09/12/24   Akula, Vijaya, MD  metolazone  (ZAROXOLYN ) 2.5 MG tablet Take 2.5 mg by mouth once a week. 08/28/24   [provider]  metoprolol  succinate (TOPROL -XL) 25 MG 24 hr tablet Take 1 tablet (25 mg total) by mouth daily. 07/18/23   [name], MD  NARCAN  4 MG/0.1ML LIQD nasal spray kit Place 0.4 mg into the nose daily as needed (opioid overdose). 04/25/19   [provider]  nystatin  (MYCOSTATIN /NYSTOP ) powder Apply 1 Application topically daily. 01/22/24   [provider]  ondansetron  (ZOFRAN -ODT) 4 MG disintegrating tablet Take 4 mg by mouth every 8 (eight) hours as needed for nausea or vomiting (dissolve the tablet orally). 09/14/23   [provider]  pantoprazole  (PROTONIX ) 40 MG tablet TAKE ONE TABLET BY MOUTH DAILY Patient taking differently: Take 40 mg by mouth daily before breakfast. 01/29/22   [name], MD  polyethylene glycol (MIRALAX  / GLYCOLAX ) 17 g packet Take 17 g by mouth daily. 09/13/24   Akula, Vijaya, MD  potassium chloride  SA (KLOR-CON  M) 20 MEQ tablet Take 40 mEq by mouth daily. 08/29/24   [provider]  pramipexole  (MIRAPEX ) 0.5 MG tablet Take 0.5  mg by mouth 3 (three) times daily. 12/16/23   [provider]  torsemide  (DEMADEX ) 20 MG tablet Take 1 tablet (20 mg total) by mouth daily. Hold if SBP is less than 100. Keep MAP >75 MM HG. 09/12/24   Cherlyn Labella, MD  traZODone  (DESYREL ) 50 MG tablet Take 0.5 tablets (25 mg total) by mouth at bedtime as needed for up to 20 days for sleep. 09/12/24 10/04/24  Akula, Vijaya, MD    Allergies: Amoxicillin -pot clavulanate, Adhesive [tape], Codeine, Crestor  [rosuvastatin  calcium ], Cyclobenzaprine , Cymbalta  [duloxetine  hcl], Gabapentin , Lexapro  [escitalopram], Lyrica [pregabalin], Morphine  and codeine, Vicodin [hydrocodone-acetaminophen ], and Latex    Review of Systems  Musculoskeletal:  Positive for back pain and myalgias.  Skin:  Positive for rash.  All other systems reviewed and are negative.   Updated Vital Signs BP (!) 147/69 (BP Location: Left Arm)   Pulse 85   Temp 97.6 F (36.4 C) (Oral)   Resp 16   Ht 5' 3 (1.6 m)   Wt (!) 141.1 kg   SpO2 99%   BMI 55.09 kg/m   Physical Exam Vitals and nursing note reviewed.  Constitutional:      General: She is awake. She is not in acute distress.    Appearance: Normal appearance. She is well-developed and well-groomed. She is obese.  HENT:     Head: Normocephalic and atraumatic.     Mouth/Throat:     Mouth: Mucous membranes are dry.     Pharynx: Oropharynx is clear.  Eyes:     General: Lids are normal. Vision grossly intact. Gaze aligned appropriately.     Extraocular Movements: Extraocular movements intact.     Conjunctiva/sclera: Conjunctivae normal.     Pupils: Pupils are equal, round, and reactive to light.  Cardiovascular:     Rate and Rhythm: Normal rate and regular rhythm.     Pulses: Normal pulses.     Heart sounds: Normal heart sounds. No murmur heard.    No friction rub. No gallop.  Pulmonary:     Effort: Pulmonary effort is normal.     Breath sounds: Normal breath sounds.  Abdominal:     General: Abdomen is flat. Bowel sounds are normal.     Palpations: Abdomen is soft.  Musculoskeletal:        General: Normal range of motion.     Cervical back: Full passive range of motion without pain, normal range of motion and neck supple.     Right lower leg: Swelling and tenderness present. Edema present.     Left lower leg: Swelling and tenderness present. Edema present.     Comments: Swelling and tenderness appreciated to the bilateral lower extremities, appearance consistent with previous presentation.  Lymphadenopathy:     Cervical: No cervical  adenopathy.  Skin:    General: Skin is warm and dry.     Capillary Refill: Capillary refill takes less than 2 seconds.     Findings: Erythema and wound present.         Comments: Multiple sites of skin breakdown consistent with stage I and stage II decubitus ulcers.  Further at the umbilicus, there is erythematous dry tissue, consistent with skin breakdown, possible superficial infection.  Neurological:     General: No focal deficit present.     Mental Status: She is alert. Mental status is at baseline.  Psychiatric:        Mood and Affect: Mood normal.        Behavior: Behavior is cooperative.     (  all labs ordered are listed, but only abnormal results are displayed) Labs Reviewed  BASIC METABOLIC PANEL WITH GFR - Abnormal; Notable for the following components:      Result Value   Potassium 2.9 (*)    Chloride 97 (*)    Creatinine, Ser 1.18 (*)    GFR, Estimated 50 (*)    All other components within normal limits  CBC WITH DIFFERENTIAL/PLATELET - Abnormal; Notable for the following components:   Hemoglobin 11.5 (*)    All other components within normal limits  URINALYSIS, W/ REFLEX TO CULTURE (INFECTION SUSPECTED) - Abnormal; Notable for the following components:   Color, Urine STRAW (*)    Specific Gravity, Urine 1.004 (*)    Leukocytes,Ua TRACE (*)    Bacteria, UA RARE (*)    All other components within normal limits  CULTURE, BLOOD (ROUTINE X 2)  CULTURE, BLOOD (ROUTINE X 2)  MAGNESIUM     EKG: None  Radiology: CT Hip Left Wo Contrast Result Date: 10/28/2024 EXAM: CT OF THE LEFT HIP WITHOUT IV CONTRAST 10/28/2024 07:08:02 PM TECHNIQUE: CT of the left hip was performed without the administration of intravenous contrast. Multiplanar reformatted images are provided for review. Automated exposure control, iterative reconstruction, and/or weight based adjustment of the mA/kV was utilized to reduce the radiation dose to as low as reasonably achievable. COMPARISON: Comparison  hip radiographs dated 07/31/2024. CLINICAL HISTORY: Hip trauma. Pain. FINDINGS: BONES/JOINT: No acute fracture or dislocation. Mild osteoarthritis of the left hip with joint space narrowing and osteophytosis. Pubic symphysis is anatomically aligned with mild degenerative changes. SOFT TISSUE: Subcutaneous soft tissue edema along the left anterolateral proximal thigh. No fluid collection. Prominent left inguinal lymph nodes measuring up to 16 mm in short axis (series 10, image 40), are nonspecific and may be reactive. Partially visualized redemonstrated fat-containing ventral abdominal wall hernia. IMPRESSION: 1. No acute fracture or dislocation. 2. Mild osteoarthritis of the left hip. 3. Subcutaneous soft tissue edema along the left anterolateral proximal thigh. No fluid collection. 4. Prominent left inguinal lymph nodes measuring up to 16 mm in short axis, nonspecific and possibly reactive. Electronically signed by: Shahmeer Lateef MD 10/28/2024 07:19 PM EST RP Workstation: [id number]   CT Head Wo Contrast Result Date: 10/28/2024 EXAM: CT HEAD WITHOUT CONTRAST 10/28/2024 07:08:02 PM TECHNIQUE: CT of the head was performed without the administration of intravenous contrast. Automated exposure control, iterative reconstruction, and/or weight based adjustment of the mA/kV was utilized to reduce the radiation dose to as low as reasonably achievable. COMPARISON: 10/26/2024 CLINICAL HISTORY: Headache, new onset (Age >= 51y) FINDINGS: BRAIN AND VENTRICLES: No acute hemorrhage. No evidence of acute infarct. No hydrocephalus. No extra-axial collection. No mass effect or midline shift. Unchanged 7 mm calcified meningioma along left frontal convexity. Mild motion artifact. ORBITS: No acute abnormality. SINUSES: No acute abnormality. SOFT TISSUES AND SKULL: No acute soft tissue abnormality. No skull fracture. IMPRESSION: 1. No acute intracranial abnormality. 2. Unchanged 7 mm calcified meningioma along the left frontal  convexity. Electronically signed by: Donnice Mania MD 10/28/2024 07:12 PM EST RP Workstation: [id number]     Procedures   Medications Ordered in the ED  famotidine  (PEPCID ) tablet 40 mg (has no administration in time range)  fluticasone  (FLONASE ) 50 MCG/ACT nasal spray 1 spray (has no administration in time range)  metoprolol  succinate (TOPROL -XL) 24 hr tablet 25 mg (25 mg Oral Given 10/28/24 1934)  ondansetron  (ZOFRAN -ODT) disintegrating tablet 4 mg (has no administration in time range)  potassium chloride  SA (KLOR-CON  M) CR  tablet 40 mEq (has no administration in time range)  pramipexole  (MIRAPEX ) tablet 0.5 mg (has no administration in time range)  torsemide  (DEMADEX ) tablet 20 mg (20 mg Oral Given 10/28/24 1933)  traZODone  (DESYREL ) tablet 25 mg (has no administration in time range)  fentaNYL  (SUBLIMAZE ) injection 50 mcg (50 mcg Intravenous Given 10/28/24 1458)  ondansetron  (ZOFRAN ) injection 4 mg (4 mg Intravenous Given 10/28/24 1458)  potassium chloride  SA (KLOR-CON  M) CR tablet 40 mEq (40 mEq Oral Given 10/28/24 1637)  lactated ringers  bolus 1,000 mL (1,000 mLs Intravenous New Bag/Given 10/28/24 1638)  HYDROmorphone  (DILAUDID ) injection 0.5 mg (0.5 mg Intravenous Given 10/28/24 1931)                                    Medical Decision Making Amount and/or Complexity of Data Reviewed Labs: ordered. Radiology: ordered.  Risk Prescription drug management.   Medical Decision Making:   Maria [name] is a 68 y.o. female who presented to the ED today with left hip pain along with skin breakdown detailed above.     Complete initial physical exam performed, notably the patient  was alert and oriented in no apparent distress.  She does have notable decubitus breakdown on the sacrum, gluteal cleft, inferior gluteal folds, as well as maceration in the pannus, and at the umbilicus..    Reviewed and confirmed nursing documentation for past medical history, family history, social history.     Initial Assessment:   With the patient's presentation of left hip pain along with skin breakdown, consider possible left hip fracture, however findings less than likely secondary to benign physical exam.  She does have extensive skin breakdown consistent with decubitus ulcers as well as poor hygiene, will consider TOC consultation secondary to patient's inability to manage her own ADLs.   Initial Plan:  Obtain CT imaging of the left hip to evaluate for acute fracture. Obtain CT of the head secondary to reported hallucinations. Screening labs including CBC and Metabolic panel to evaluate for infectious or metabolic etiology of disease.  Urinalysis with reflex culture ordered to evaluate for UTI or relevant urologic/nephrologic pathology.  Assess blood culture secondary to septic presentation. Objective evaluation as below reviewed   Initial Study Results:   Laboratory  All laboratory results reviewed without evidence of clinically relevant pathology.   Exceptions include: Hypokalemia noted at 2.9.  Creatinine is 1.18 with decreased GFR to 50 however this appears to be baseline recently for this patient.  Hemoglobin is mildly decreased to 11.5.   Radiology:  All images reviewed independently. Agree with radiology report at this time.   CT Hip Left Wo Contrast Result Date: 10/28/2024 EXAM: CT OF THE LEFT HIP WITHOUT IV CONTRAST 10/28/2024 07:08:02 PM TECHNIQUE: CT of the left hip was performed without the administration of intravenous contrast. Multiplanar reformatted images are provided for review. Automated exposure control, iterative reconstruction, and/or weight based adjustment of the mA/kV was utilized to reduce the radiation dose to as low as reasonably achievable. COMPARISON: Comparison hip radiographs dated 07/31/2024. CLINICAL HISTORY: Hip trauma. Pain. FINDINGS: BONES/JOINT: No acute fracture or dislocation. Mild osteoarthritis of the left hip with joint space narrowing and  osteophytosis. Pubic symphysis is anatomically aligned with mild degenerative changes. SOFT TISSUE: Subcutaneous soft tissue edema along the left anterolateral proximal thigh. No fluid collection. Prominent left inguinal lymph nodes measuring up to 16 mm in short axis (series 10, image 40), are nonspecific  and may be reactive. Partially visualized redemonstrated fat-containing ventral abdominal wall hernia. IMPRESSION: 1. No acute fracture or dislocation. 2. Mild osteoarthritis of the left hip. 3. Subcutaneous soft tissue edema along the left anterolateral proximal thigh. No fluid collection. 4. Prominent left inguinal lymph nodes measuring up to 16 mm in short axis, nonspecific and possibly reactive. Electronically signed by: Shahmeer Lateef MD 10/28/2024 07:19 PM EST RP Workstation: [id number]   CT Head Wo Contrast Result Date: 10/28/2024 EXAM: CT HEAD WITHOUT CONTRAST 10/28/2024 07:08:02 PM TECHNIQUE: CT of the head was performed without the administration of intravenous contrast. Automated exposure control, iterative reconstruction, and/or weight based adjustment of the mA/kV was utilized to reduce the radiation dose to as low as reasonably achievable. COMPARISON: 10/26/2024 CLINICAL HISTORY: Headache, new onset (Age >= 51y) FINDINGS: BRAIN AND VENTRICLES: No acute hemorrhage. No evidence of acute infarct. No hydrocephalus. No extra-axial collection. No mass effect or midline shift. Unchanged 7 mm calcified meningioma along left frontal convexity. Mild motion artifact. ORBITS: No acute abnormality. SINUSES: No acute abnormality. SOFT TISSUES AND SKULL: No acute soft tissue abnormality. No skull fracture. IMPRESSION: 1. No acute intracranial abnormality. 2. Unchanged 7 mm calcified meningioma along the left frontal convexity. Electronically signed by: Donnice Mania MD 10/28/2024 07:12 PM EST RP Workstation: [id number]   CT Head Wo Contrast Result Date: 10/26/2024 EXAM: CT HEAD WITHOUT CONTRAST 10/26/2024  01:13:52 PM TECHNIQUE: CT of the head was performed without the administration of intravenous contrast. Automated exposure control, iterative reconstruction, and/or weight based adjustment of the mA/kV was utilized to reduce the radiation dose to as low as reasonably achievable. COMPARISON: 12/10/2023 CLINICAL HISTORY: Mental status change, unknown cause FINDINGS: BRAIN AND VENTRICLES: No acute hemorrhage. No evidence of acute infarct. No hydrocephalus. No extra-axial collection. No mass effect or midline shift. Stable 7 mm calcified meningioma along left frontal convexity. ORBITS: No acute abnormality. SABRA SINUSES: No acute abnormality. SOFT TISSUES AND SKULL: No acute soft tissue abnormality. No skull fracture. IMPRESSION: 1. No acute intracranial abnormality. 2. Stable calcified left frontal convexity meningioma measuring 7 mm. Electronically signed by: Lonni Necessary MD 10/26/2024 01:38 PM EST RP Workstation: [id number]   DG Knee Complete 4 Views Left Result Date: 10/26/2024 EXAM: 4 OR MORE VIEW(S) XRAY OF THE LEFT KNEE 10/26/2024 07:40:00 AM COMPARISON: 09/11/2024 CLINICAL HISTORY: fall FINDINGS: BONES AND JOINTS: No acute fracture. No focal osseous lesion. No joint dislocation. No significant joint effusion. Moderate narrowing of medial joint space is noted. Mild osteophyte formation is noted laterally. Mild patellar spurring. SOFT TISSUES: The soft tissues are unremarkable. IMPRESSION: 1. No acute fracture or dislocation. 2. Osteoarthritis with moderate medial compartment joint space narrowing, mild lateral osteophyte formation, and mild patellar spurring. Electronically signed by: Lynwood Seip MD 10/26/2024 07:51 AM EST RP Workstation: [id number]   DG Shoulder Left Result Date: 10/26/2024 EXAM: 1 VIEW XRAY OF THE LEFT SHOULDER 10/26/2024 07:40:00 AM COMPARISON: 06/07/2020 CLINICAL HISTORY: fall fall FINDINGS: BONES AND JOINTS: Mild degenerative changes are seen involving the left acromioclavicular  and glenohumeral joints. Glenohumeral joint is normally aligned. No acute fracture or dislocation. SOFT TISSUES: No abnormal calcifications. Visualized lung is unremarkable. IMPRESSION: 1. No acute fracture or dislocation. 2. Mild osteoarthrosis of the left acromioclavicular and glenohumeral joints. Electronically signed by: Lynwood Seip MD 10/26/2024 07:49 AM EST RP Workstation: [id number]   DG Hip Unilat W or Wo Pelvis 2-3 Views Left Result Date: 10/26/2024 EXAM: 2 or 3 VIEW(S) XRAY OF THE LEFT HIP 10/26/2024 07:40:00 AM COMPARISON: 01/30/2024 CLINICAL HISTORY: fall  fall FINDINGS: BONES AND JOINTS: No acute fracture or focal osseous lesion. The hip joint is maintained. Mild osteophyte formation of the Left hip is noted. SOFT TISSUES: The soft tissues are unremarkable. IMPRESSION: 1. No acute fracture or dislocation. 2. Mild osteophyte formation of the left hip. Electronically signed by: Lynwood Seip MD 10/26/2024 07:47 AM EST RP Workstation: [id number]   VAS US  LOWER EXTREMITY VENOUS (DVT) (ONLY [facility] & [facility]) Result Date: 10/24/2024  Lower Venous DVT Study Patient Name:  Maria [name]  Date of Exam:   10/24/2024 Medical Rec #: [id number]        Accession #:    [id number] Date of Birth: [birth year]        Patient Gender: F Patient Age:   31 years Exam Location:  [facility] Procedure:      VAS US  LOWER EXTREMITY VENOUS (DVT) Referring Phys: ELIZABETH REES --------------------------------------------------------------------------------  Indications: Pain, Swelling, and Edema. Other Indications: Chronic issues with pain and swelling in her legs but states                    over the last 3 days symptoms have worsened. Risk Factors: Obesity. Comparison Study: 06/17/24 - Limited evaluation of the calf veins bilaterally.                   Negative DVT in all other veins. Performing Technologist: Ricka Sturdivant-Jones RDMS, RVT  Examination Guidelines: A complete evaluation includes B-mode imaging, spectral Doppler,  color Doppler, and power Doppler as needed of all accessible portions of each vessel. Bilateral testing is considered an integral part of a complete examination. Limited examinations for reoccurring indications may be performed as noted. The reflux portion of the exam is performed with the patient in reverse Trendelenburg.  +---------+---------------+---------+-----------+----------+-------------------+ RIGHT    CompressibilityPhasicitySpontaneityPropertiesThrombus Aging      +---------+---------------+---------+-----------+----------+-------------------+ CFV      Full           Yes      Yes                                      +---------+---------------+---------+-----------+----------+-------------------+ SFJ      Full                                                             +---------+---------------+---------+-----------+----------+-------------------+ FV Prox  Full                                                             +---------+---------------+---------+-----------+----------+-------------------+ FV Mid   Full           Yes      Yes                                      +---------+---------------+---------+-----------+----------+-------------------+ FV DistalFull                                                             +---------+---------------+---------+-----------+----------+-------------------+  PFV      Full                                                             +---------+---------------+---------+-----------+----------+-------------------+ POP      Full           Yes      Yes                                      +---------+---------------+---------+-----------+----------+-------------------+ PTV                                                   Patent by color     +---------+---------------+---------+-----------+----------+-------------------+ PERO                                                  Not well visualized  +---------+---------------+---------+-----------+----------+-------------------+   +---------+---------------+---------+-----------+----------+------------------+ LEFT     CompressibilityPhasicitySpontaneityPropertiesThrombus Aging     +---------+---------------+---------+-----------+----------+------------------+ CFV      Full           Yes      Yes                                     +---------+---------------+---------+-----------+----------+------------------+ SFJ      Full                                                            +---------+---------------+---------+-----------+----------+------------------+ FV Prox  Full                                                            +---------+---------------+---------+-----------+----------+------------------+ FV Mid   Full           Yes      Yes                                     +---------+---------------+---------+-----------+----------+------------------+ FV DistalFull                                                            +---------+---------------+---------+-----------+----------+------------------+ PFV      Full                                                            +---------+---------------+---------+-----------+----------+------------------+  POP      Full           Yes      Yes                                     +---------+---------------+---------+-----------+----------+------------------+ PTV                                                   Patent by color                                                          only               +---------+---------------+---------+-----------+----------+------------------+ PERO                                                  Not well                                                                 visualized         +---------+---------------+---------+-----------+----------+------------------+     Summary: RIGHT: -  There is no evidence of deep vein thrombosis in the lower extremity. However, portions of this examination were limited- see technologist comments above.  - No cystic structure found in the popliteal fossa.  LEFT: - There is no evidence of deep vein thrombosis in the lower extremity. However, portions of this examination were limited- see technologist comments above.  - No cystic structure found in the popliteal fossa.  *See table(s) above for measurements and observations. Electronically signed by Lonni Gaskins MD on 10/24/2024 at 9:50:05 AM.    Final    DG Chest 2 View Result Date: 10/24/2024 EXAM: 2 VIEW(S) XRAY OF THE CHEST 10/24/2024 05:42:00 AM COMPARISON: 09/06/2024 CLINICAL HISTORY: progressive edema FINDINGS: LUNGS AND PLEURA: No focal pulmonary opacity. No pulmonary edema. No pleural effusion. No pneumothorax. HEART AND MEDIASTINUM: Similar enlarged cardiomediastinal silhouette. Aortic arch calcifications. BONES AND SOFT TISSUES: Intact cervical spinal fixation hardware. IMPRESSION: 1. No acute cardiopulmonary process identified. Electronically signed by: Waddell Calk MD 10/24/2024 06:44 AM EST RP Workstation: [id number]     Reassessment and Plan:   Based on patient's worsening condition and voiced concerns over her inability to care for herself, TOC consultation arranged to likely have her placed in a SNF to manage her chronic conditions until she is stable for release to home.  This was thoroughly discussed with the patient and they agree with this, consent to [facility] consultation.  Anticipation of this, home medications have been ordered, she will continue to get hydration by IV, monitor for pain control, disposition pending consultation with TOC staff.       Final diagnoses:  Left hip pain  Pressure injury of contiguous region involving back, buttock, and hip, stage 1, unspecified laterality  Bilateral leg pain    ED Discharge Orders     None          Myriam Dorn BROCKS,  PA 10/28/24 2012    Mannie Pac T, DO 10/31/24 2332

## 2024-10-29 MED ORDER — OXYCODONE HCL 5 MG PO TABS
10.0000 mg | ORAL_TABLET | Freq: Four times a day (QID) | ORAL | Status: AC | PRN
  Administered 2024-10-29 – 2024-10-30 (×5): 10 mg via ORAL
  Filled 2024-10-29 (×5): qty 2

## 2024-10-29 NOTE — Evaluation (Signed)
 Physical Therapy Evaluation Patient Details Name: Maria [name] MRN: [id number] DOB: [birth year] Today's Date: 10/29/2024  History of Present Illness  Pt from home with c/o falls, L hip, L knee and back pain; hallucinations; bil LE edema with venous stasis dermatitis; and inability to self care. Pt with multiple recent visits to ED with similar issues.  Pt with hx of PVD, CHF, DM, cervical fusion, chronic pain  Clinical Impression  Pt admitted as above and presenting with functional mobility limitations 2* generalized weakness/deconditioning, limited activity tolerance, obesity, balance deficits and inability to self care at current level.  Pt reports sleeping in wc with edematous LEs in dependent position bc she was unable to get onto the bed.  Patient will benefit from continued inpatient follow up therapy, <3 hours/day to maximize IND and safety prior to return home with very limited assist.       If plan is discharge home, recommend the following: Two people to help with walking and/or transfers;A lot of help with bathing/dressing/bathroom;Assistance with cooking/housework;Assist for transportation;Help with stairs or ramp for entrance   Can travel by private vehicle   No    Equipment Recommendations None recommended by PT  Recommendations for Other Services  OT consult    Functional Status Assessment Patient has had a recent decline in their functional status and demonstrates the ability to make significant improvements in function in a reasonable and predictable amount of time.     Precautions / Restrictions Precautions Precautions: Fall Restrictions Weight Bearing Restrictions Per Provider Order: No      Mobility  Bed Mobility Overal bed mobility: Needs Assistance Bed Mobility: Supine to Sit, Sit to Supine     Supine to sit: Mod assist, +2 for physical assistance, +2 for safety/equipment Sit to supine: Mod assist, +2 for physical assistance, +2 for safety/equipment    General bed mobility comments: Increased time with assist to manage LEs, to control trunk and to complete rotations with use of bed pad    Transfers Overall transfer level: Needs assistance Equipment used: Rolling walker (2 wheels) Transfers: Sit to/from Stand Sit to Stand: Min assist, Mod assist, +2 safety/equipment, +2 physical assistance, From elevated surface           General transfer comment: assist to bring wt up and fwd and to balance in standing with RW.  Pt stood x 2    Ambulation/Gait Ambulation/Gait assistance: Min assist, +2 physical assistance, +2 safety/equipment Gait Distance (Feet): 2 Feet Assistive device: Rolling walker (2 wheels) Gait Pattern/deviations: Step-to pattern, Decreased step length - right, Decreased step length - left, Shuffle, Trunk flexed Gait velocity: decr     General Gait Details: Pt tolerated slow side shuffle with RW along EOB only  Stairs            Wheelchair Mobility     Tilt Bed    Modified Rankin (Stroke Patients Only)       Balance Overall balance assessment: Needs assistance Sitting-balance support: No upper extremity supported, Feet supported Sitting balance-Leahy Scale: Good     Standing balance support: Bilateral upper extremity supported Standing balance-Leahy Scale: Poor                               Pertinent Vitals/Pain Pain Assessment Pain Assessment: 0-10 Pain Score: 8  Pain Location: L knee, hip and back Pain Descriptors / Indicators: Aching, Grimacing, Sore Pain Intervention(s): Limited activity within patient's tolerance, Monitored during session, Premedicated  before session    Home Living Family/patient expects to be discharged to:: Skilled nursing facility Living Arrangements: Alone Available Help at Discharge: Family Type of Home: Apartment Home Access: Level entry       Home Layout: One level Home Equipment: Hospital bed;Tub bench;Rolling Walker (2 wheels);Rollator (4  wheels);BSC/3in1;Wheelchair - manual Additional Comments: bedside commode frame placed over toilet, received a wide Wheelchair recently that does not fit through doors and does not work properly per pt    Prior Function Prior Level of Function : History of Falls (last six months)             Mobility Comments: Pt uses Rollator or WC.  Pt reports has not been to bed for days 2* inability to get legs into bed and has been sleeping in [facility] ADLs Comments: BSC over toilet. alternate sponge bathing and recently getting in shower since now has a tub bench. Orders groceries, prescriptions, insurance provides transport to MD when needed     Extremity/Trunk Assessment   Upper Extremity Assessment Upper Extremity Assessment: Generalized weakness (AROM WFL)    Lower Extremity Assessment Lower Extremity Assessment: Generalized weakness;RLE deficits/detail;LLE deficits/detail RLE Deficits / Details: Significant edema bilaterally RLE: Unable to fully assess due to pain LLE: Unable to fully assess due to pain       Communication   Communication Communication: No apparent difficulties    Cognition Arousal: Alert Behavior During Therapy: WFL for tasks assessed/performed   PT - Cognitive impairments: No apparent impairments                       PT - Cognition Comments: Pt pleasant and oriented but tearful.  Pt reports hx of hallucinations at home and seeing what looked like 4 aliens Following commands: Intact       Cueing Cueing Techniques: Verbal cues, Gestural cues     General Comments      Exercises     Assessment/Plan    PT Assessment Patient needs continued PT services  PT Problem List Decreased strength;Decreased range of motion;Decreased activity tolerance;Decreased balance;Decreased mobility;Decreased knowledge of use of DME;Pain;Obesity       PT Treatment Interventions DME instruction;Gait training;Functional mobility training;Therapeutic  activities;Therapeutic exercise;Balance training;Patient/family education    PT Goals (Current goals can be found in the Care Plan section)  Acute Rehab PT Goals Patient Stated Goal: Regain IND PT Goal Formulation: With patient Time For Goal Achievement: 11/12/24 Potential to Achieve Goals: Good    Frequency Min 2X/week     Co-evaluation               AM-PAC PT 6 Clicks Mobility  Outcome Measure Help needed turning from your back to your side while in a flat bed without using bedrails?: A Lot Help needed moving from lying on your back to sitting on the side of a flat bed without using bedrails?: A Lot Help needed moving to and from a bed to a chair (including a wheelchair)?: A Lot Help needed standing up from a chair using your arms (e.g., wheelchair or bedside chair)?: A Lot Help needed to walk in hospital room?: Total Help needed climbing 3-5 steps with a railing? : Total 6 Click Score: 10    End of Session Equipment Utilized During Treatment: Gait belt Activity Tolerance: Patient limited by fatigue;Patient limited by pain Patient left: in bed;with call bell/phone within reach;with nursing/sitter in room Nurse Communication: Mobility status PT Visit Diagnosis: Unsteadiness on feet (R26.81);Difficulty in walking, not  elsewhere classified (R26.2);Muscle weakness (generalized) (M62.81)    Time: 8944-8881 PT Time Calculation (min) (ACUTE ONLY): 23 min   Charges:   PT Evaluation $PT Eval Low Complexity: 1 Low PT Treatments $Therapeutic Activity: 8-22 mins PT General Charges $$ ACUTE PT VISIT: 1 Visit         [facility] PT Acute Rehabilitation Services Office [phone]   Jodette Wik 10/29/2024, 1:10 PM

## 2024-10-29 NOTE — Progress Notes (Signed)
 CSW spoke with the pt, who reports that she lives alone and was trying to get to her chair but slid out and was unable to get up. She stated that she has been having issues with walking and has been experiencing these problems for about a month. Pt expressed interest in a rehab placement. She reported that she was discharged from [facility] in October. CSW explained that the pt may be in her copay days, the pt verbalized understanding.  12:50pm CSW discussed PT recommendations for SNF placement, and the pt is agreeable. CSW explained the process and informed the pt that insurance authorization will be required. Care management to follow.

## 2024-10-29 NOTE — NC FL2 (Signed)
 [place]  MEDICAID FL2 LEVEL OF CARE FORM     IDENTIFICATION  Patient Name: Maria [name] Birthdate: June 18, 1956 Sex: female Admission Date (Current Location): 10/28/2024  [facility] and [place] Number:  Producer, Television/film/video and Address:  [facility],  [address], [place] [address]      Provider Number: [id number]  Attending Physician Name and Address:  No att. providers found  Relative Name and Phone Number:       Current Level of Care: Hospital Recommended Level of Care: Skilled Nursing Facility Prior Approval Number:    Date Approved/Denied:   PASRR Number: [id number]  Discharge Plan: SNF    Current Diagnoses: Patient Active Problem List   Diagnosis Date Noted   UTI (urinary tract infection) 09/01/2024   Generalized weakness 01/31/2024   Hypoglycemia 01/31/2024   Anemia of chronic disease 01/31/2024   Pressure injury of skin 11/26/2023   Hallucinations 09/02/2023   Stasis dermatitis of both legs 08/30/2023   Hypomagnesemia 08/02/2023   Ankle pain, right 08/01/2023   Weakness of both lower extremities 07/30/2023   Morbid obesity (HCC) 07/30/2023   (HFpEF) heart failure with preserved ejection fraction (HCC) 07/15/2023   Abdominal pain 07/15/2023   Acute renal failure superimposed on stage 3a chronic kidney disease (HCC) 07/15/2023   Acute on chronic respiratory failure with hypoxia and hypercapnia (HCC) 02/08/2023   Pneumonia due to infectious organism 02/05/2023   Chronic right-sided congestive heart failure (HCC) 02/04/2023   Fall 02/02/2023   Physical deconditioning 07/22/2022   Fluid overload 07/21/2022   Acute cystitis    Dyspnea 12/19/2021   Leg swelling 11/20/2021   Bilateral knee pain 10/29/2021   Hypernatremia 09/24/2021   Cellulitis of lower extremity 09/24/2021   CKD (chronic kidney disease), stage IIIa (HCC) 09/24/2021   Sacral decubitus ulcer, stage II (HCC) 09/20/2021   Acute on chronic heart failure with preserved ejection  fraction (HFpEF) (HCC) 09/19/2021   Nail disorder 07/07/2021   Pre-ulcerative corn or callous 07/07/2021   Rash 07/07/2021   Chronic diastolic HF (heart failure) (HCC) 03/17/2021   DM2 (diabetes mellitus, type 2) (HCC) 03/11/2021   Vitamin D  deficiency 01/01/2021   Medial epicondylitis 12/31/2020   Aortic atherosclerosis 09/26/2020   Umbilical hernia 09/26/2020   Constipation 08/05/2020   Arthritis 06/12/2020   Hoarseness 05/01/2020   B12 deficiency 01/31/2020   Rosacea 01/31/2020   Neck swelling 01/31/2020   History of colonic polyps    Benign neoplasm of Jake    Nausea 08/31/2019   Throat pain 07/26/2019   HLD (hyperlipidemia) 05/24/2019   Leg cramps 05/24/2019   Lump in neck 05/24/2019   Left thyroid  nodule 01/25/2019   Jerking movements of extremities 07/20/2018   Balance disorder 07/20/2018   Right knee pain 03/21/2018   OSA (obstructive sleep apnea) 03/21/2018   Oxygen desaturation 03/21/2018   Urinary symptom or sign 03/21/2018   Lymphedema 01/25/2018   Gait disorder 01/25/2018   Dysphonia 12/07/2017   Encounter for well adult exam with abnormal findings 11/08/2017   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   [place] polyp 08/11/2016   Abdominal pain, epigastric    Dysphagia    [place] cancer screening    ACE-inhibitor cough 12/05/2015   Restless legs syndrome 10/08/2015   Venous stasis dermatitis of both lower extremities 04/26/2015   GERD without esophagitis 10/17/2014   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/17/2014   AR (allergic rhinitis) 10/17/2014   Fatty liver 10/17/2014   Chronic pain syndrome 09/19/2014   Post-traumatic osteoarthritis of  both knees 09/19/2014   Spinal stenosis 09/19/2014   Depression, major, single episode, complete remission 09/19/2014   Narcotic dependence (HCC) 09/19/2014   Acute hypoxic respiratory failure (HCC) 08/20/2014   False positive HIV serology 06/02/2013   Hypokalemia 06/29/2012   Spondylosis of lumbar region without  myelopathy or radiculopathy 03/09/2012   Lumbar disc disease 03/09/2012   Morbid obesity with body mass index (BMI) of 50.0 to 59.9 in adult [facility]) 06/17/2011   Chronic low back pain 12/28/2008   GAD (generalized anxiety disorder) 12/06/2007   Depression 12/06/2007   Essential hypertension 12/06/2007   Gastroesophageal reflux disease without esophagitis 12/06/2007   LOW BACK PAIN 12/06/2007   Allergic rhinitis 08/10/2007    Orientation RESPIRATION BLADDER Height & Weight     Self, Time, Situation, Place  Normal Continent Weight: (!) 311 lb (141.1 kg) Height:  5' 3 (160 cm)  BEHAVIORAL SYMPTOMS/MOOD NEUROLOGICAL BOWEL NUTRITION STATUS      Continent Diet (regular)  AMBULATORY STATUS COMMUNICATION OF NEEDS Skin   Extensive Assist Verbally Other (Comment), Skin abrasions (stage I and stage II decubitus ulcers.)                       Personal Care Assistance Level of Assistance  Bathing, Feeding, Dressing Bathing Assistance: Limited assistance Feeding assistance: Independent Dressing Assistance: Limited assistance     Functional Limitations Info  Sight, Hearing, Speech Sight Info: Adequate Hearing Info: Adequate Speech Info: Adequate    SPECIAL CARE FACTORS FREQUENCY  PT (By licensed PT), OT (By licensed OT)     PT Frequency: 5 x a week OT Frequency: 5 x a week            Contractures Contractures Info: Not present    Additional Factors Info  Code Status, Allergies Code Status Info: full Allergies Info: Amoxicillin -pot Clavulanate  Adhesive (Tape)  Codeine  Crestor  (Rosuvastatin  Calcium )  Cyclobenzaprine   Cymbalta  (Duloxetine  Hcl)  Gabapentin   Lexapro (Escitalopram)  Lyrica (Pregabalin)  Morphine  And Codeine  Vicodin (Hydrocodone-acetaminophen )  Latex           Current Medications (10/29/2024):  This is the current hospital active medication list Current Facility-Administered Medications  Medication Dose Route Frequency Provider Last Rate Last Admin    famotidine  (PEPCID ) tablet 40 mg  40 mg Oral QHS Mannie Pac T, DO   40 mg at 10/28/24 2234   fluticasone  (FLONASE ) 50 MCG/ACT nasal spray 1 spray  1 spray Each Nare Daily Mannie Pac T, DO       metoprolol  succinate (TOPROL -XL) 24 hr tablet 25 mg  25 mg Oral Daily Mannie Pac T, DO   25 mg at 10/29/24 1148   ondansetron  (ZOFRAN -ODT) disintegrating tablet 4 mg  4 mg Oral Q8H PRN Mannie Pac T, DO       oxyCODONE  (Oxy IR/ROXICODONE ) immediate release tablet 10 mg  10 mg Oral Q6H PRN Sponseller, Rebekah R, PA-C   10 mg at 10/29/24 0850   potassium chloride  SA (KLOR-CON  M) CR tablet 40 mEq  40 mEq Oral Daily Mannie Pac T, DO   40 mEq at 10/29/24 1148   pramipexole  (MIRAPEX ) tablet 0.5 mg  0.5 mg Oral TID Mannie Pac T, DO   0.5 mg at 10/28/24 2234   torsemide  (DEMADEX ) tablet 20 mg  20 mg Oral Daily Mannie Pac T, DO   20 mg at 10/29/24 1148   traZODone  (DESYREL ) tablet 25 mg  25 mg Oral QHS PRN Mannie Pac DASEN, DO  25 mg at 10/28/24 2347   Current Outpatient Medications  Medication Sig Dispense Refill   albuterol  (VENTOLIN  HFA) 108 (90 Base) MCG/ACT inhaler Inhale 1 puff into the lungs every 6 (six) hours as needed for wheezing or shortness of breath.     cholecalciferol  (VITAMIN D3) 25 MCG (1000 UNIT) tablet Take 1,000 Units by mouth daily.     clonazePAM  (KLONOPIN ) 1 MG tablet Take 1 tablet (1 mg total) by mouth 2 (two) times daily as needed for up to 5 days. for anxiety 10 tablet 0   cyanocobalamin  1000 MCG tablet Take 1 tablet (1,000 mcg total) by mouth daily. 30 tablet 0   diclofenac  Sodium (VOLTAREN ) 1 % GEL Apply 4 g topically 4 (four) times daily. 20 g 1   famotidine  (PEPCID ) 40 MG tablet Take 40 mg by mouth at bedtime.     fluticasone  (FLONASE ) 50 MCG/ACT nasal spray Place 1 spray into both nostrils daily.     metolazone  (ZAROXOLYN ) 2.5 MG tablet Take 2.5 mg by mouth once a week.     metoprolol  succinate (TOPROL -XL) 25 MG 24 hr tablet Take 1 tablet (25 mg  total) by mouth daily. 30 tablet 0   NARCAN  4 MG/0.1ML LIQD nasal spray kit Place 0.4 mg into the nose daily as needed (opioid overdose).     nystatin  (MYCOSTATIN /NYSTOP ) powder Apply 1 Application topically daily.     ondansetron  (ZOFRAN -ODT) 4 MG disintegrating tablet Take 4 mg by mouth every 8 (eight) hours as needed for nausea or vomiting (dissolve the tablet orally).     pantoprazole  (PROTONIX ) 40 MG tablet TAKE ONE TABLET BY MOUTH DAILY (Patient taking differently: Take 40 mg by mouth daily before breakfast.) 90 tablet 2   potassium chloride  SA (KLOR-CON  M) 20 MEQ tablet Take 40 mEq by mouth daily.     pramipexole  (MIRAPEX ) 0.5 MG tablet Take 0.5 mg by mouth 3 (three) times daily.     spironolactone  (ALDACTONE ) 25 MG tablet Take 25 mg by mouth daily.     tirzepatide (MOUNJARO) 15 MG/0.5ML Pen Inject 15 mg into the skin once a week.     torsemide  (DEMADEX ) 20 MG tablet Take 1 tablet (20 mg total) by mouth daily. Hold if SBP is less than 100. Keep MAP >75 MM HG. (Patient taking differently: Take 40 mg by mouth 2 (two) times daily. Hold if SBP is less than 100. Keep MAP >75 MM HG.)     feeding supplement (ENSURE PLUS HIGH PROTEIN) LIQD Take 237 mLs by mouth 2 (two) times daily between meals. (Patient not taking: Reported on 10/29/2024) 14220 mL 2   methocarbamol  (ROBAXIN ) 500 MG tablet Take 1 tablet (500 mg total) by mouth every 6 (six) hours as needed for muscle spasms. (Patient not taking: Reported on 10/29/2024) 20 tablet 0   polyethylene glycol (MIRALAX  / GLYCOLAX ) 17 g packet Take 17 g by mouth daily. (Patient not taking: Reported on 10/29/2024) 14 each 0   traZODone  (DESYREL ) 50 MG tablet Take 0.5 tablets (25 mg total) by mouth at bedtime as needed for up to 20 days for sleep. 10 tablet 0     Discharge Medications: Please see discharge summary for a list of discharge medications.  Relevant Imaging Results:  Relevant Lab Results:   Additional Information SSN:[id number]  Tawni HERO  Sharonica Kraszewski, LCSW

## 2024-10-30 LAB — BASIC METABOLIC PANEL WITH GFR
Anion gap: 6 (ref 5–15)
BUN: 11 mg/dL (ref 8–23)
CO2: 34 mmol/L — ABNORMAL HIGH (ref 22–32)
Calcium: 8.3 mg/dL — ABNORMAL LOW (ref 8.9–10.3)
Chloride: 103 mmol/L (ref 98–111)
Creatinine, Ser: 0.96 mg/dL (ref 0.44–1.00)
GFR, Estimated: >60 mL/min (ref >60)
Glucose, Bld: 98 mg/dL (ref 70–99)
Potassium: 3.6 mmol/L (ref 3.5–5.1)
Sodium: 143 mmol/L (ref 135–145)

## 2024-10-30 MED ORDER — LIDOCAINE 5 % EX PTCH
1.0000 | MEDICATED_PATCH | CUTANEOUS | Status: DC
Start: 2024-10-30 — End: 2024-10-30
  Filled 2024-10-30: qty 1

## 2024-10-30 MED ORDER — KETOROLAC TROMETHAMINE 15 MG/ML IJ SOLN
15.0000 mg | Freq: Once | INTRAMUSCULAR | Status: AC
  Administered 2024-10-30: 15 mg via INTRAMUSCULAR
  Filled 2024-10-30: qty 1

## 2024-10-30 MED ORDER — LIDOCAINE 5 % EX PTCH: 1.0000 | MEDICATED_PATCH | CUTANEOUS | 0 refills | Status: DC

## 2024-10-30 MED ORDER — DICLOFENAC SODIUM 1 % EX GEL: 4.0000 g | Freq: Four times a day (QID) | CUTANEOUS | 0 refills | Status: AC

## 2024-10-30 MED ORDER — KETOROLAC TROMETHAMINE 15 MG/ML IJ SOLN: 15.0000 mg | Freq: Once | INTRAMUSCULAR | Status: DC

## 2024-10-30 NOTE — ED Provider Notes (Addendum)
 Emergency Medicine Observation Re-evaluation Note  Maria [name] is a 68 y.o. female, seen on rounds today.  Pt initially presented to the ED for complaints of Back Pain Currently, the patient is lying in bed.  Physical Exam  BP (!) 140/76   Pulse 74   Temp 98.7 F (37.1 C) (Oral)   Resp 17   Ht 5' 3 (1.6 m)   Wt (!) 141.1 kg   SpO2 93%   BMI 55.09 kg/m  Physical Exam General: No acute distress Cardiac: Well-perfused Lungs: No respiratory distress  ED Course / MDM  EKG:   I have reviewed the labs performed to date as well as medications administered while in observation.  Recent changes in the last 24 hours include none.  Plan  Current plan is for SNF placement.  The patient is complaining of pain.  Nursing staff encouraged to give the patient her as needed pain medications.    Ula Prentice SAUNDERS, MD 10/30/24 0745  The patient was being evaluated for SNF placement but after discussion with her family she decided that she would like to go home and does have pain medication there as well as home health.  She is ultimately discharged per her request.   Ula Prentice SAUNDERS, MD 10/30/24 1302

## 2024-10-30 NOTE — Progress Notes (Addendum)
 CSW presented bed offers. Pt chose [facility]. CSW awaiting response from rep to confirm bed.   Addend @ 9:53AM Bed confirmed. Auth pending for [facility].   Addend @ 1:02PM Pt states she wishes to go home. She reports she has a wheelchair, walker, and everything she needs. She states she is active with [facility] and will call them to come out- reported they've already completed an assessment. CSW requested EDP order PT/OT/Aide. PTAR to transport. Pt has key to get into her home. No further ICM needs.

## 2024-10-30 NOTE — ED Notes (Signed)
 Pt states that she is stiff and cramped up, pt was turned to her right side for comfort. Pt states having severe pain. MD notified.

## 2024-10-30 NOTE — Progress Notes (Addendum)
 Pt's shara was approved. CSW notified pt who is now wanting to go to SNF where she will have private room. Paramedic changed drop off location to [facility]. Pt in agreement with plan and thanked this clinical research associate. Report info given to paramedic via secure chat and EDP made aware.

## 2024-10-30 NOTE — Discharge Instructions (Signed)
 Please take your pain medication at home as prescribed.  You may try the lidocaine  patches or Voltaren  gel in addition to your pain medication at home.  Please follow-up with your doctor and return to the ER for worsening symptoms.

## 2024-10-30 NOTE — ED Notes (Signed)
 Patient purewick canister changed.

## 2024-11-02 LAB — CULTURE, BLOOD (ROUTINE X 2)
Culture: NO GROWTH
Culture: NO GROWTH
Special Requests: ADEQUATE
Special Requests: ADEQUATE

## 2024-11-06 ENCOUNTER — Telehealth (INDEPENDENT_AMBULATORY_CARE_PROVIDER_SITE_OTHER): Payer: Self-pay

## 2024-11-06 NOTE — Telephone Encounter (Signed)
 I spoke with pt due to her having an in office appt. 11/08/24 that I am going to cancel. Pt needs ct scan and Bx before f/up w/[facility]. I sent a message to scheduler for ct scan and Bx, so they will call pt back and get her scheduled. 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 I told pt to call the office and schedule a f/up w/[facility] for a week and 1/2 or 2 weeks after Bx is done. Pt understood.

## 2024-11-08 ENCOUNTER — Ambulatory Visit (INDEPENDENT_AMBULATORY_CARE_PROVIDER_SITE_OTHER)

## 2024-11-14 ENCOUNTER — Inpatient Hospital Stay: Attending: Internal Medicine | Admitting: Internal Medicine

## 2024-11-14 VITALS — BP 148/56 | HR 65 | Temp 97.2°F | Resp 18 | Wt 291.1 lb

## 2024-11-14 DIAGNOSIS — D329 Benign neoplasm of meninges, unspecified: Secondary | ICD-10-CM

## 2024-11-14 NOTE — Progress Notes (Signed)
 [facility] at [facility] [address]  [place], [place] [address] [phone]   New Patient Evaluation  Date of Service: 11/14/24 Patient Name: Maria [name] Patient MRN: [id number] Patient DOB: 1956-12-12 Provider: Arthea MARLA Manns, MD  Identifying Statement:  Maria [name] is a 68 y.o. female with left frontal meningioma who presents for initial consultation and evaluation.    Referring Provider: Ileen [name], NP [address] [place],  [place] [address]  Oncologic History: Oncology History   No history exists.    Biomarkers: n/a  History of Present Illness: The patient's records from the referring physician were obtained and reviewed and the patient interviewed to confirm this HPI.  Maria [name] presents today for evaluation after abnormal CT head.  She obtained the imaging because of tremulousness in her hands, 7mm dural based but calcified mass was identified in the left convexity.  She denies any weakness, numbness, seizures, slurred speech.  She does acknowledge the tremulousness, alongside some short term memory issues.  Recently had neck surgery with Dr. Louis, currently in rehab facility.  Medications: Current Outpatient Medications on File Prior to Visit  Medication Sig Dispense Refill   albuterol  (VENTOLIN  HFA) 108 (90 Base) MCG/ACT inhaler Inhale 1 puff into the lungs every 6 (six) hours as needed for wheezing or shortness of breath.     cholecalciferol  (VITAMIN D3) 25 MCG (1000 UNIT) tablet Take 1,000 Units by mouth daily.     clonazePAM  (KLONOPIN ) 1 MG tablet Take 1 tablet (1 mg total) by mouth 2 (two) times daily as needed for up to 5 days. for anxiety 10 tablet 0   cyanocobalamin  1000 MCG tablet Take 1 tablet (1,000 mcg total) by mouth daily. 30 tablet 0   famotidine  (PEPCID ) 40 MG tablet Take 40 mg by mouth at bedtime.     feeding supplement (ENSURE PLUS HIGH PROTEIN) LIQD Take 237 mLs by mouth 2 (two) times daily  between meals. (Patient taking differently: Take 237 mLs by mouth 2 (two) times daily between meals. Patient receives this if she asks) 85779 mL 2   fluticasone  (FLONASE ) 50 MCG/ACT nasal spray Place 1 spray into both nostrils daily.     lidocaine  (LIDODERM ) 5 % Place 1 patch onto the skin daily. Remove & Discard patch within 12 hours or as directed by MD 30 patch 0   methocarbamol  (ROBAXIN ) 500 MG tablet Take 1 tablet (500 mg total) by mouth every 6 (six) hours as needed for muscle spasms. 20 tablet 0   metoprolol  succinate (TOPROL -XL) 25 MG 24 hr tablet Take 1 tablet (25 mg total) by mouth daily. 30 tablet 0   NARCAN  4 MG/0.1ML LIQD nasal spray kit Place 0.4 mg into the nose daily as needed (opioid overdose).     nystatin  (MYCOSTATIN /NYSTOP ) powder Apply 1 Application topically daily.     ondansetron  (ZOFRAN -ODT) 4 MG disintegrating tablet Take 4 mg by mouth every 8 (eight) hours as needed for nausea or vomiting (dissolve the tablet orally).     pantoprazole  (PROTONIX ) 40 MG tablet TAKE ONE TABLET BY MOUTH DAILY 90 tablet 2   polyethylene glycol (MIRALAX  / GLYCOLAX ) 17 g packet Take 17 g by mouth daily. 14 each 0   potassium chloride  SA (KLOR-CON  M) 20 MEQ tablet Take 40 mEq by mouth daily.     pramipexole  (MIRAPEX ) 0.5 MG tablet Take 0.5 mg by mouth 3 (three) times daily.     spironolactone  (ALDACTONE ) 25 MG tablet Take 25  mg by mouth daily.     torsemide  (DEMADEX ) 20 MG tablet Take 1 tablet (20 mg total) by mouth daily. Hold if SBP is less than 100. Keep MAP >75 MM HG. (Patient taking differently: Take 40 mg by mouth 2 (two) times daily. Hold if SBP is less than 100. Keep MAP >75 MM HG.)     traZODone  (DESYREL ) 50 MG tablet Take 0.5 tablets (25 mg total) by mouth at bedtime as needed for up to 20 days for sleep. 10 tablet 0   diclofenac  Sodium (VOLTAREN ) 1 % GEL Apply 4 g topically 4 (four) times daily. 20 g 1   diclofenac  Sodium (VOLTAREN ) 1 % GEL Apply 4 g topically 4 (four) times daily. 100 g  0   metolazone  (ZAROXOLYN ) 2.5 MG tablet Take 2.5 mg by mouth once a week.     tirzepatide (MOUNJARO) 15 MG/0.5ML Pen Inject 15 mg into the skin once a week. (Patient not taking: Reported on 11/14/2024)     No current facility-administered medications on file prior to visit.    Allergies:  Allergies  Allergen Reactions   Amoxicillin -Pot Clavulanate Nausea And Vomiting and Other (See Comments)    Projectile vomiting   Adhesive [Tape] Other (See Comments)    Tears skin off - use paper tape    Codeine Other (See Comments)    hallucinations   Crestor  [Rosuvastatin  Calcium ] Other (See Comments)    Severe muscle cramps   Cyclobenzaprine  Other (See Comments)    Hallucinations    Cymbalta  [Duloxetine  Hcl] Other (See Comments)    Hallucinations    Gabapentin  Other (See Comments)    Trembling   Lexapro [Escitalopram] Other (See Comments)    Hallucinations    Lyrica [Pregabalin] Other (See Comments)    weakness   Morphine  And Codeine Nausea And Vomiting and Other (See Comments)    Migraine headaches   Vicodin [Hydrocodone-Acetaminophen ] Other (See Comments)    hallucinations   Latex Rash   Past Medical History:  Past Medical History:  Diagnosis Date   Acute lymphadenitis 2011   ALLERGIC RHINITIS 08/10/2007   ANXIETY 12/06/2007   Atherosclerotic peripheral vascular disease 06/13/2013   Aorta on CT June 2014   Cellulitis 2011   Cervical disc disease 03/09/2012   CHF (congestive heart failure) (HCC)    Chronic pain 03/09/2012   DEPRESSION 12/06/2007   Diabetes (HCC)    DIVERTICULOSIS, [place] 12/06/2007   GERD 12/06/2007   Hepatitis    age 68 hepatitis A   HLD (hyperlipidemia) 05/24/2019   HYPERTENSION 12/06/2007   Lumbar disc disease 03/09/2012   Mesenteric adenitis    MRSA 2006   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 2010   CERVICAL DISC SURGERY  may 24th 2010   COLONOSCOPY WITH PROPOFOL  N/A 07/10/2016   Procedure: COLONOSCOPY WITH PROPOFOL ;  Surgeon: Elspeth Deward Naval, MD;  Location: [facility];  Service: Gastroenterology;  Laterality: N/A;   COLONOSCOPY WITH PROPOFOL  N/A 09/26/2019   Procedure: COLONOSCOPY WITH PROPOFOL ;  Surgeon: Naval Elspeth SQUIBB, MD;  Location: [facility];  Service: Gastroenterology;  Laterality: N/A;   ESOPHAGOGASTRODUODENOSCOPY (EGD) WITH PROPOFOL  N/A 07/10/2016   Procedure: ESOPHAGOGASTRODUODENOSCOPY (EGD) WITH PROPOFOL ;  Surgeon: Elspeth Deward Naval, MD;  Location: [facility];  Service: Gastroenterology;  Laterality: N/A;   POLYPECTOMY  09/26/2019   Procedure: POLYPECTOMY;  Surgeon: Naval Elspeth SQUIBB, MD;  Location: [facility];  Service: Gastroenterology;;   RADIOLOGY WITH ANESTHESIA N/A 07/30/2023   Procedure: MRI WITH ANESTHESIA - THORACIC LUMBAR WITHOUT CONSTRAST;  Surgeon: Radiologist, Medication, MD;  Location: [facility] OR;  Service: Radiology;  Laterality: N/A;   s/p ovary cyst     s/p right knee arthroscopy     Dr. Josefina ortho   Social History:  Social History   Socioeconomic History   Marital status: Divorced    Spouse name: Not on file   Number of children: 2   Years of education: 14   Highest education level: Not on file  Occupational History   Occupation: disabled  Tobacco Use   Smoking status: Former    Current packs/day: 0.00    Types: Cigarettes    Start date: 11/08/1978    Quit date: 11/08/2008    Years since quitting: 16.0   Smokeless tobacco: Never   Tobacco comments:     quit 10/09  Vaping Use   Vaping status: Never Used  Substance and Sexual Activity   Alcohol  use: No    Comment: quit drinking 10/07/1997   Drug use: No   Sexual activity: Not Currently    Birth control/protection: Surgical, Abstinence  Other Topics Concern   Not on file  Social History Narrative   Patient has difficulty financially affording food, medication, and affording essential bills. She states she has limited support from her daughter.    Right handed   One floor apartment   Drinks caffeine prn   Lives alone      Social Drivers of Health   Financial Resource Strain: Medium Risk (12/30/2021)   Overall Financial Resource Strain (CARDIA)    Difficulty of Paying Living Expenses: Somewhat hard  Food Insecurity: No Food Insecurity (09/01/2024)   Hunger Vital Sign    Worried About Running Out of Food in the Last Year: Never true    Ran Out of Food in the Last Year: Never true  Transportation Needs: No Transportation Needs (09/01/2024)   PRAPARE - Administrator, Civil Service (Medical): No    Lack of Transportation (Non-Medical): No  Physical Activity: Inactive (12/30/2021)   Exercise Vital Sign    Days of Exercise per Week: 0 days    Minutes of Exercise per Session: 0 min  Stress: No Stress Concern Present (12/30/2021)   [employer] of Occupational Health - Occupational Stress Questionnaire    Feeling of Stress : Not at all  Social Connections: Socially Isolated (09/01/2024)   Social Connection and Isolation Panel    Frequency of Communication with Friends and Family: More than three times a week    Frequency of Social Gatherings with Friends and Family: Once a week    Attends Religious Services: Never    Database Administrator or Organizations: No    Attends Banker Meetings: Never    Marital Status: Divorced  Catering Manager Violence: Not At Risk (09/01/2024)   Humiliation, Afraid, Rape, and Kick questionnaire    Fear of Current or  Ex-Partner: No    Emotionally Abused: No    Physically Abused: No    Sexually Abused: No   Family History:  Family  History  Problem Relation Age of Onset   Heart disease Mother    Hypertension Mother    Diabetes Mother    Heart failure Mother    Asthma Sister    Anxiety disorder Sister    Depression Sister    Hypertension Father    Asthma Daughter    Bipolar disorder Daughter    Cancer Maternal Uncle        Whitener   Cancer Other        ovarian   Liver cancer Paternal Grandmother        ????    Review of Systems: Constitutional: Doesn't report fevers, chills or abnormal weight loss Eyes: Doesn't report blurriness of vision Ears, nose, mouth, throat, and face: Doesn't report sore throat Respiratory: Doesn't report cough, dyspnea or wheezes Cardiovascular: Doesn't report palpitation, chest discomfort  Gastrointestinal:  Doesn't report nausea, constipation, diarrhea GU: Doesn't report incontinence Skin: Doesn't report skin rashes Neurological: Per HPI Musculoskeletal: Doesn't report joint pain Behavioral/Psych: Doesn't report anxiety  Physical Exam: Vitals:   11/14/24 1100  BP: (!) 148/56  Pulse: 65  Resp: 18  Temp: (!) 97.2 F (36.2 C)  SpO2: 99%   KPS: 70. General: Wheelchair bound Head: Normal EENT: No conjunctival injection or scleral icterus.  Lungs: Resp effort normal Cardiac: Regular rate Abdomen: Non-distended abdomen Skin: No rashes cyanosis or petechiae. Extremities: No clubbing or edema  Neurologic Exam: Mental Status: Awake, alert, attentive to examiner. Oriented to self and environment. Language is fluent with intact comprehension.  Cranial Nerves: Visual acuity is grossly normal. Visual fields are full. Extra-ocular movements intact. No ptosis. Face is symmetric Motor: Tone and bulk are normal. Power is full in both arms and legs. Reflexes are symmetric, no pathologic reflexes present.  Sensory: Intact to light touch Gait: Orthopedic  limitations   Labs: I have reviewed the data as listed    Component Value Date/Time   NA 143 10/30/2024 1121   NA 140 08/13/2022 1227   K 3.6 10/30/2024 1121   CL 103 10/30/2024 1121   CO2 34 (H) 10/30/2024 1121   GLUCOSE 98 10/30/2024 1121   GLUCOSE 107 (H) 12/31/2006 0847   BUN 11 10/30/2024 1121   BUN 24 08/13/2022 1227   CREATININE 0.96 10/30/2024 1121   CREATININE 1.05 (H) 09/10/2020 1255   CALCIUM  8.3 (L) 10/30/2024 1121   PROT 6.3 (L) 10/24/2024 0451   ALBUMIN  3.7 10/24/2024 0451   AST 19 10/24/2024 0451   ALT 14 10/24/2024 0451   ALKPHOS 102 10/24/2024 0451   BILITOT 0.9 10/24/2024 0451   GFRNONAA >60 10/30/2024 1121   GFRNONAA 83 03/24/2016 1607   GFRAA >60 08/06/2020 1611   GFRAA >89 03/24/2016 1607   Lab Results  Component Value Date   WBC 9.4 10/28/2024   NEUTROABS 6.7 10/28/2024   HGB 11.5 (L) 10/28/2024   HCT 37.4 10/28/2024   MCV 91.4 10/28/2024   PLT 293 10/28/2024    Imaging:  DG Knee 2 Views Left Result Date: 10/28/2024 EXAM: 1 OR 2 VIEW(S) XRAY OF THE LEFT KNEE 10/28/2024 11:04:35 PM COMPARISON: Exam dated 10/26/2024. CLINICAL HISTORY: pain FINDINGS: BONES AND JOINTS: No acute fracture. No joint dislocation. Small suprapatellar knee joint effusion. Tricompartmental degenerative changes are noted, similar to that seen on the prior exam. SOFT TISSUES: The soft tissues are unremarkable. IMPRESSION: 1. Tricompartmental degenerative change. 2. Small suprapatellar knee joint effusion. Electronically signed by: Oneil Devonshire MD 10/28/2024 11:11 PM EST RP Workstation: [id number]   DG Knee 2  Views Right Result Date: 10/28/2024 EXAM: 1 or 2 VIEW(S) XRAY OF THE KNEE 10/28/2024 11:04:35 PM COMPARISON: 09/01/2024 CLINICAL HISTORY: pain FINDINGS: BONES AND JOINTS: No acute fracture. No significant joint effusion. Tricompartmental moderate degenerative changes with joint space narrowing and osteophyte formation. SOFT TISSUES: Generalized soft tissue swelling.  IMPRESSION: 1. Moderate tricompartmental degenerative changes with joint space narrowing and osteophyte formation. Electronically signed by: Oneil Devonshire MD 10/28/2024 11:09 PM EST RP Workstation: [id number]   CT Hip Left Wo Contrast Result Date: 10/28/2024 EXAM: CT OF THE LEFT HIP WITHOUT IV CONTRAST 10/28/2024 07:08:02 PM TECHNIQUE: CT of the left hip was performed without the administration of intravenous contrast. Multiplanar reformatted images are provided for review. Automated exposure control, iterative reconstruction, and/or weight based adjustment of the mA/kV was utilized to reduce the radiation dose to as low as reasonably achievable. COMPARISON: Comparison hip radiographs dated 07/31/2024. CLINICAL HISTORY: Hip trauma. Pain. FINDINGS: BONES/JOINT: No acute fracture or dislocation. Mild osteoarthritis of the left hip with joint space narrowing and osteophytosis. Pubic symphysis is anatomically aligned with mild degenerative changes. SOFT TISSUE: Subcutaneous soft tissue edema along the left anterolateral proximal thigh. No fluid collection. Prominent left inguinal lymph nodes measuring up to 16 mm in short axis (series 10, image 40), are nonspecific and may be reactive. Partially visualized redemonstrated fat-containing ventral abdominal wall hernia. IMPRESSION: 1. No acute fracture or dislocation. 2. Mild osteoarthritis of the left hip. 3. Subcutaneous soft tissue edema along the left anterolateral proximal thigh. No fluid collection. 4. Prominent left inguinal lymph nodes measuring up to 16 mm in short axis, nonspecific and possibly reactive. Electronically signed by: Shahmeer Lateef MD 10/28/2024 07:19 PM EST RP Workstation: [id number]   CT Head Wo Contrast Result Date: 10/28/2024 EXAM: CT HEAD WITHOUT CONTRAST 10/28/2024 07:08:02 PM TECHNIQUE: CT of the head was performed without the administration of intravenous contrast. Automated exposure control, iterative reconstruction, and/or weight based  adjustment of the mA/kV was utilized to reduce the radiation dose to as low as reasonably achievable. COMPARISON: 10/26/2024 CLINICAL HISTORY: Headache, new onset (Age >= 51y) FINDINGS: BRAIN AND VENTRICLES: No acute hemorrhage. No evidence of acute infarct. No hydrocephalus. No extra-axial collection. No mass effect or midline shift. Unchanged 7 mm calcified meningioma along left frontal convexity. Mild motion artifact. ORBITS: No acute abnormality. SINUSES: No acute abnormality. SOFT TISSUES AND SKULL: No acute soft tissue abnormality. No skull fracture. IMPRESSION: 1. No acute intracranial abnormality. 2. Unchanged 7 mm calcified meningioma along the left frontal convexity. Electronically signed by: Donnice Mania MD 10/28/2024 07:12 PM EST RP Workstation: [id number]   CT Head Wo Contrast Result Date: 10/26/2024 EXAM: CT HEAD WITHOUT CONTRAST 10/26/2024 01:13:52 PM TECHNIQUE: CT of the head was performed without the administration of intravenous contrast. Automated exposure control, iterative reconstruction, and/or weight based adjustment of the mA/kV was utilized to reduce the radiation dose to as low as reasonably achievable. COMPARISON: 12/10/2023 CLINICAL HISTORY: Mental status change, unknown cause FINDINGS: BRAIN AND VENTRICLES: No acute hemorrhage. No evidence of acute infarct. No hydrocephalus. No extra-axial collection. No mass effect or midline shift. Stable 7 mm calcified meningioma along left frontal convexity. ORBITS: No acute abnormality. SABRA SINUSES: No acute abnormality. SOFT TISSUES AND SKULL: No acute soft tissue abnormality. No skull fracture. IMPRESSION: 1. No acute intracranial abnormality. 2. Stable calcified left frontal convexity meningioma measuring 7 mm. Electronically signed by: Lonni Necessary MD 10/26/2024 01:38 PM EST RP Workstation: [id number]   DG Knee Complete 4 Views Left Result Date: 10/26/2024 EXAM: 4 OR  MORE VIEW(S) XRAY OF THE LEFT KNEE 10/26/2024 07:40:00 AM  COMPARISON: 09/11/2024 CLINICAL HISTORY: fall FINDINGS: BONES AND JOINTS: No acute fracture. No focal osseous lesion. No joint dislocation. No significant joint effusion. Moderate narrowing of medial joint space is noted. Mild osteophyte formation is noted laterally. Mild patellar spurring. SOFT TISSUES: The soft tissues are unremarkable. IMPRESSION: 1. No acute fracture or dislocation. 2. Osteoarthritis with moderate medial compartment joint space narrowing, mild lateral osteophyte formation, and mild patellar spurring. Electronically signed by: Lynwood Seip MD 10/26/2024 07:51 AM EST RP Workstation: [id number]   DG Shoulder Left Result Date: 10/26/2024 EXAM: 1 VIEW XRAY OF THE LEFT SHOULDER 10/26/2024 07:40:00 AM COMPARISON: 06/07/2020 CLINICAL HISTORY: fall fall FINDINGS: BONES AND JOINTS: Mild degenerative changes are seen involving the left acromioclavicular and glenohumeral joints. Glenohumeral joint is normally aligned. No acute fracture or dislocation. SOFT TISSUES: No abnormal calcifications. Visualized lung is unremarkable. IMPRESSION: 1. No acute fracture or dislocation. 2. Mild osteoarthrosis of the left acromioclavicular and glenohumeral joints. Electronically signed by: Lynwood Seip MD 10/26/2024 07:49 AM EST RP Workstation: [id number]   DG Hip Unilat W or Wo Pelvis 2-3 Views Left Result Date: 10/26/2024 EXAM: 2 or 3 VIEW(S) XRAY OF THE LEFT HIP 10/26/2024 07:40:00 AM COMPARISON: 01/30/2024 CLINICAL HISTORY: fall fall FINDINGS: BONES AND JOINTS: No acute fracture or focal osseous lesion. The hip joint is maintained. Mild osteophyte formation of the Left hip is noted. SOFT TISSUES: The soft tissues are unremarkable. IMPRESSION: 1. No acute fracture or dislocation. 2. Mild osteophyte formation of the left hip. Electronically signed by: Lynwood Seip MD 10/26/2024 07:47 AM EST RP Workstation: [id number]   VAS US  LOWER EXTREMITY VENOUS (DVT) (ONLY [facility]) Result Date: 10/24/2024  Lower Venous DVT  Study Patient Name:  CHIANTE PEDEN Ellerbe  Date of Exam:   10/24/2024 Medical Rec #: [id number]        Accession #:    [id number] Date of Birth: September 28, 1956        Patient Gender: F Patient Age:   71 years Exam Location:  [facility] Procedure:      VAS US  LOWER EXTREMITY VENOUS (DVT) Referring Phys: ELIZABETH REES --------------------------------------------------------------------------------  Indications: Pain, Swelling, and Edema. Other Indications: Chronic issues with pain and swelling in her legs but states                    over the last 3 days symptoms have worsened. Risk Factors: Obesity. Comparison Study: 06/17/24 - Limited evaluation of the calf veins bilaterally.                   Negative DVT in all other veins. Performing Technologist: Ricka Sturdivant-Jones RDMS, RVT  Examination Guidelines: A complete evaluation includes B-mode imaging, spectral Doppler, color Doppler, and power Doppler as needed of all accessible portions of each vessel. Bilateral testing is considered an integral part of a complete examination. Limited examinations for reoccurring indications may be performed as noted. The reflux portion of the exam is performed with the patient in reverse Trendelenburg.  +---------+---------------+---------+-----------+----------+-------------------+ RIGHT    CompressibilityPhasicitySpontaneityPropertiesThrombus Aging      +---------+---------------+---------+-----------+----------+-------------------+ CFV      Full           Yes      Yes                                      +---------+---------------+---------+-----------+----------+-------------------+ SFJ  Full                                                             +---------+---------------+---------+-----------+----------+-------------------+ FV Prox  Full                                                             +---------+---------------+---------+-----------+----------+-------------------+ FV Mid    Full           Yes      Yes                                      +---------+---------------+---------+-----------+----------+-------------------+ FV DistalFull                                                             +---------+---------------+---------+-----------+----------+-------------------+ PFV      Full                                                             +---------+---------------+---------+-----------+----------+-------------------+ POP      Full           Yes      Yes                                      +---------+---------------+---------+-----------+----------+-------------------+ PTV                                                   Patent by color     +---------+---------------+---------+-----------+----------+-------------------+ PERO                                                  Not well visualized +---------+---------------+---------+-----------+----------+-------------------+   +---------+---------------+---------+-----------+----------+------------------+ LEFT     CompressibilityPhasicitySpontaneityPropertiesThrombus Aging     +---------+---------------+---------+-----------+----------+------------------+ CFV      Full           Yes      Yes                                     +---------+---------------+---------+-----------+----------+------------------+ SFJ      Full                                                            +---------+---------------+---------+-----------+----------+------------------+  FV Prox  Full                                                            +---------+---------------+---------+-----------+----------+------------------+ FV Mid   Full           Yes      Yes                                     +---------+---------------+---------+-----------+----------+------------------+ FV DistalFull                                                             +---------+---------------+---------+-----------+----------+------------------+ PFV      Full                                                            +---------+---------------+---------+-----------+----------+------------------+ POP      Full           Yes      Yes                                     +---------+---------------+---------+-----------+----------+------------------+ PTV                                                   Patent by color                                                          only               +---------+---------------+---------+-----------+----------+------------------+ PERO                                                  Not well                                                                 visualized         +---------+---------------+---------+-----------+----------+------------------+     Summary: RIGHT: - There is no evidence of deep vein thrombosis in the lower extremity. However, portions of this examination were limited- see technologist comments above.  - No  cystic structure found in the popliteal fossa.  LEFT: - There is no evidence of deep vein thrombosis in the lower extremity. However, portions of this examination were limited- see technologist comments above.  - No cystic structure found in the popliteal fossa.  *See table(s) above for measurements and observations. Electronically signed by Lonni Gaskins MD on 10/24/2024 at 9:50:05 AM.    Final    DG Chest 2 View Result Date: 10/24/2024 EXAM: 2 VIEW(S) XRAY OF THE CHEST 10/24/2024 05:42:00 AM COMPARISON: 09/06/2024 CLINICAL HISTORY: progressive edema FINDINGS: LUNGS AND PLEURA: No focal pulmonary opacity. No pulmonary edema. No pleural effusion. No pneumothorax. HEART AND MEDIASTINUM: Similar enlarged cardiomediastinal silhouette. Aortic arch calcifications. BONES AND SOFT TISSUES: Intact cervical spinal fixation hardware. IMPRESSION: 1. No acute cardiopulmonary  process identified. Electronically signed by: Waddell Calk MD 10/24/2024 06:44 AM EST RP Workstation: [id number]    Pathology: n/a  Assessment/Plan Meningioma [facility]) - Plan: MR BRAIN W WO CONTRAST  We appreciate the opportunity to participate in the care of [facility].  She presents with radiographic findings consistent with small meningioma.    We did not recommend resection or any other intervention at this time due to small lesion size, non-eloquent location, lack of focal symptoms.  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  Recommended continuing imaging surveillance, with contrast enhanced MRI brain study in 1 year.  Screening for potential clinical trials was performed and discussed using eligibility criteria for active protocols at [facility], loco-regional tertiary centers, as well as national database available on [email or web address].    The patient is not a candidate for a research protocol at this time due to no suitable study identified.   We spent twenty additional minutes teaching regarding the natural history, biology, and historical experience in the treatment of brain tumors. We then discussed in detail the current recommendations for therapy focusing on the mode of administration, mechanism of action, anticipated toxicities, and quality of life issues associated with this plan. We also provided teaching sheets for the patient to take home as an additional resource.  All questions were answered. The patient knows to call the clinic with any problems, questions or concerns. No barriers to learning were detected.  I personally spent a total of 45 minutes in the care of the patient today, including counseling and review of test results.       Shaquan Puerta K Krystel Fletchall, MD Medical Director of Neuro-Oncology [facility] at [facility] 11/14/24 11:19 AM

## 2024-11-15 ENCOUNTER — Ambulatory Visit (HOSPITAL_COMMUNITY)
Admission: RE | Admit: 2024-11-15 | Discharge: 2024-11-15 | Disposition: A | Discharge status: Home / Self Care | Source: Ambulatory Visit

## 2024-11-15 DIAGNOSIS — K118 Other diseases of salivary glands: Secondary | ICD-10-CM | POA: Diagnosis present

## 2024-11-15 MED ORDER — IOHEXOL 300 MG/ML  SOLN
75.0000 mL | Freq: Once | INTRAMUSCULAR | Status: AC | PRN
  Administered 2024-11-15: 75 mL via INTRAVENOUS

## 2024-11-20 ENCOUNTER — Encounter (HOSPITAL_COMMUNITY): Payer: Self-pay

## 2024-11-20 NOTE — Progress Notes (Signed)
 Karalee Wilkie POUR, MD  Mayline Dragon Approved for US  guided FNA of tiny left parotid nodule.    No sedation.  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

## 2024-11-26 ENCOUNTER — Other Ambulatory Visit: Payer: Self-pay

## 2024-11-26 ENCOUNTER — Emergency Department (HOSPITAL_COMMUNITY)

## 2024-11-26 ENCOUNTER — Encounter (HOSPITAL_COMMUNITY): Payer: Self-pay | Admitting: Internal Medicine

## 2024-11-26 ENCOUNTER — Inpatient Hospital Stay (HOSPITAL_COMMUNITY)
Admission: EM | Admit: 2024-11-26 | Discharge: 2024-11-30 | DRG: 690 | Disposition: A | Discharge status: Skilled Nursing Facility | Attending: Internal Medicine | Admitting: Internal Medicine

## 2024-11-26 DIAGNOSIS — I5032 Chronic diastolic (congestive) heart failure: Secondary | ICD-10-CM | POA: Diagnosis present

## 2024-11-26 DIAGNOSIS — G8929 Other chronic pain: Secondary | ICD-10-CM | POA: Diagnosis present

## 2024-11-26 DIAGNOSIS — N3000 Acute cystitis without hematuria: Secondary | ICD-10-CM | POA: Diagnosis present

## 2024-11-26 DIAGNOSIS — I2510 Atherosclerotic heart disease of native coronary artery without angina pectoris: Secondary | ICD-10-CM | POA: Diagnosis present

## 2024-11-26 DIAGNOSIS — E119 Type 2 diabetes mellitus without complications: Secondary | ICD-10-CM

## 2024-11-26 DIAGNOSIS — K5900 Constipation, unspecified: Secondary | ICD-10-CM | POA: Diagnosis not present

## 2024-11-26 DIAGNOSIS — Z885 Allergy status to narcotic agent status: Secondary | ICD-10-CM | POA: Diagnosis not present

## 2024-11-26 DIAGNOSIS — Z8249 Family history of ischemic heart disease and other diseases of the circulatory system: Secondary | ICD-10-CM | POA: Diagnosis not present

## 2024-11-26 DIAGNOSIS — N390 Urinary tract infection, site not specified: Secondary | ICD-10-CM | POA: Diagnosis present

## 2024-11-26 DIAGNOSIS — I5030 Unspecified diastolic (congestive) heart failure: Secondary | ICD-10-CM | POA: Diagnosis present

## 2024-11-26 DIAGNOSIS — J9811 Atelectasis: Secondary | ICD-10-CM | POA: Diagnosis present

## 2024-11-26 DIAGNOSIS — Z79899 Other long term (current) drug therapy: Secondary | ICD-10-CM | POA: Diagnosis not present

## 2024-11-26 DIAGNOSIS — I110 Hypertensive heart disease with heart failure: Secondary | ICD-10-CM | POA: Diagnosis present

## 2024-11-26 DIAGNOSIS — Z825 Family history of asthma and other chronic lower respiratory diseases: Secondary | ICD-10-CM | POA: Diagnosis not present

## 2024-11-26 DIAGNOSIS — K219 Gastro-esophageal reflux disease without esophagitis: Secondary | ICD-10-CM | POA: Diagnosis present

## 2024-11-26 DIAGNOSIS — E1151 Type 2 diabetes mellitus with diabetic peripheral angiopathy without gangrene: Secondary | ICD-10-CM | POA: Diagnosis present

## 2024-11-26 DIAGNOSIS — G2581 Restless legs syndrome: Secondary | ICD-10-CM | POA: Diagnosis present

## 2024-11-26 DIAGNOSIS — Z87891 Personal history of nicotine dependence: Secondary | ICD-10-CM | POA: Diagnosis not present

## 2024-11-26 DIAGNOSIS — E66813 Obesity, class 3: Secondary | ICD-10-CM | POA: Diagnosis present

## 2024-11-26 DIAGNOSIS — Z880 Allergy status to penicillin: Secondary | ICD-10-CM | POA: Diagnosis not present

## 2024-11-26 DIAGNOSIS — E785 Hyperlipidemia, unspecified: Secondary | ICD-10-CM | POA: Diagnosis present

## 2024-11-26 DIAGNOSIS — M545 Low back pain, unspecified: Secondary | ICD-10-CM | POA: Diagnosis not present

## 2024-11-26 DIAGNOSIS — Z6841 Body Mass Index (BMI) 40.0 and over, adult: Secondary | ICD-10-CM | POA: Diagnosis not present

## 2024-11-26 DIAGNOSIS — F419 Anxiety disorder, unspecified: Secondary | ICD-10-CM | POA: Diagnosis present

## 2024-11-26 DIAGNOSIS — Z91048 Other nonmedicinal substance allergy status: Secondary | ICD-10-CM | POA: Diagnosis not present

## 2024-11-26 DIAGNOSIS — I700 Atherosclerosis of aorta: Secondary | ICD-10-CM | POA: Diagnosis present

## 2024-11-26 DIAGNOSIS — Z888 Allergy status to other drugs, medicaments and biological substances status: Secondary | ICD-10-CM | POA: Diagnosis not present

## 2024-11-26 DIAGNOSIS — Z7985 Long-term (current) use of injectable non-insulin antidiabetic drugs: Secondary | ICD-10-CM | POA: Diagnosis not present

## 2024-11-26 DIAGNOSIS — D649 Anemia, unspecified: Secondary | ICD-10-CM | POA: Diagnosis present

## 2024-11-26 DIAGNOSIS — N12 Tubulo-interstitial nephritis, not specified as acute or chronic: Principal | ICD-10-CM

## 2024-11-26 LAB — CBC WITH DIFFERENTIAL/PLATELET
Abs Immature Granulocytes: 0.04 K/uL (ref 0.00–0.07)
Basophils Absolute: 0 K/uL (ref 0.0–0.1)
Basophils Relative: 0 %
Eosinophils Absolute: 0.1 K/uL (ref 0.0–0.5)
Eosinophils Relative: 1 %
HCT: 35.2 % — ABNORMAL LOW (ref 36.0–46.0)
Hemoglobin: 11.4 g/dL — ABNORMAL LOW (ref 12.0–15.0)
Immature Granulocytes: 0 %
Lymphocytes Relative: 22 %
Lymphs Abs: 2.6 K/uL (ref 0.7–4.0)
MCH: 29.8 pg (ref 26.0–34.0)
MCHC: 32.4 g/dL (ref 30.0–36.0)
MCV: 91.9 fL (ref 80.0–100.0)
Monocytes Absolute: 0.9 K/uL (ref 0.1–1.0)
Monocytes Relative: 8 %
Neutro Abs: 8.1 K/uL — ABNORMAL HIGH (ref 1.7–7.7)
Neutrophils Relative %: 69 %
Platelets: 246 K/uL (ref 150–400)
RBC: 3.83 MIL/uL — ABNORMAL LOW (ref 3.87–5.11)
RDW: 13.6 % (ref 11.5–15.5)
WBC: 11.7 K/uL — ABNORMAL HIGH (ref 4.0–10.5)
nRBC: 0 % (ref 0.0–0.2)

## 2024-11-26 LAB — URINALYSIS, ROUTINE W REFLEX MICROSCOPIC
Bilirubin Urine: NEGATIVE
Glucose, UA: NEGATIVE mg/dL
Hgb urine dipstick: NEGATIVE
Ketones, ur: NEGATIVE mg/dL
Leukocytes,Ua: NEGATIVE
Nitrite: POSITIVE — AB
Protein, ur: NEGATIVE mg/dL
Specific Gravity, Urine: 1.004 — ABNORMAL LOW (ref 1.005–1.030)
pH: 5 (ref 5.0–8.0)

## 2024-11-26 LAB — BASIC METABOLIC PANEL WITH GFR
Anion gap: 9 (ref 5–15)
BUN: 13 mg/dL (ref 8–23)
CO2: 27 mmol/L (ref 22–32)
Calcium: 9.6 mg/dL (ref 8.9–10.3)
Chloride: 102 mmol/L (ref 98–111)
Creatinine, Ser: 1.03 mg/dL — ABNORMAL HIGH (ref 0.44–1.00)
GFR, Estimated: 59 mL/min — ABNORMAL LOW (ref >60)
Glucose, Bld: 85 mg/dL (ref 70–99)
Potassium: 4.3 mmol/L (ref 3.5–5.1)
Sodium: 138 mmol/L (ref 135–145)

## 2024-11-26 LAB — GLUCOSE, CAPILLARY
Glucose-Capillary: 54 mg/dL — ABNORMAL LOW (ref 70–99)
Glucose-Capillary: 71 mg/dL (ref 70–99)

## 2024-11-26 MED ORDER — OXYCODONE HCL 5 MG PO TABS
5.0000 mg | ORAL_TABLET | ORAL | Status: DC | PRN
  Administered 2024-11-26: 5 mg via ORAL
  Filled 2024-11-26: qty 1

## 2024-11-26 MED ORDER — MORPHINE SULFATE (PF) 4 MG/ML IV SOLN
4.0000 mg | Freq: Once | INTRAVENOUS | Status: AC
  Administered 2024-11-26: 4 mg via INTRAVENOUS
  Filled 2024-11-26: qty 1

## 2024-11-26 MED ORDER — IOHEXOL 300 MG/ML  SOLN
100.0000 mL | Freq: Once | INTRAMUSCULAR | Status: AC | PRN
  Administered 2024-11-26: 100 mL via INTRAVENOUS

## 2024-11-26 MED ORDER — SODIUM CHLORIDE 0.9 % IV SOLN
2.0000 g | Freq: Once | INTRAVENOUS | Status: AC
  Administered 2024-11-26: 2 g via INTRAVENOUS
  Filled 2024-11-26: qty 20

## 2024-11-26 MED ORDER — ONDANSETRON HCL 4 MG/2ML IJ SOLN: 4.0000 mg | Freq: Four times a day (QID) | INTRAMUSCULAR | Status: DC | PRN

## 2024-11-26 MED ORDER — TRAZODONE HCL 50 MG PO TABS
25.0000 mg | ORAL_TABLET | Freq: Every evening | ORAL | Status: DC | PRN
  Administered 2024-11-26 – 2024-11-28 (×4): 25 mg via ORAL
  Filled 2024-11-26 (×4): qty 1

## 2024-11-26 MED ORDER — KETOROLAC TROMETHAMINE 15 MG/ML IJ SOLN: 15.0000 mg | Freq: Once | INTRAMUSCULAR | Status: DC

## 2024-11-26 MED ORDER — ALBUTEROL SULFATE (2.5 MG/3ML) 0.083% IN NEBU: 2.5000 mg | INHALATION_SOLUTION | RESPIRATORY_TRACT | Status: DC | PRN

## 2024-11-26 MED ORDER — ACETAMINOPHEN 650 MG RE SUPP: 650.0000 mg | Freq: Four times a day (QID) | RECTAL | Status: DC | PRN

## 2024-11-26 MED ORDER — ONDANSETRON HCL 4 MG/2ML IJ SOLN
4.0000 mg | Freq: Once | INTRAMUSCULAR | Status: AC
  Administered 2024-11-26: 4 mg via INTRAVENOUS
  Filled 2024-11-26: qty 2

## 2024-11-26 MED ORDER — ENOXAPARIN SODIUM 60 MG/0.6ML IJ SOSY
60.0000 mg | PREFILLED_SYRINGE | INTRAMUSCULAR | Status: DC
  Administered 2024-11-26 – 2024-11-29 (×4): 60 mg via SUBCUTANEOUS
  Filled 2024-11-26 (×4): qty 0.6

## 2024-11-26 MED ORDER — INSULIN ASPART 100 UNIT/ML IJ SOLN
0.0000 [IU] | Freq: Three times a day (TID) | INTRAMUSCULAR | Status: DC
  Administered 2024-11-28: 2 [IU] via SUBCUTANEOUS
  Filled 2024-11-26: qty 2

## 2024-11-26 MED ORDER — INSULIN ASPART 100 UNIT/ML IJ SOLN: 0.0000 [IU] | Freq: Every day | INTRAMUSCULAR | Status: DC

## 2024-11-26 MED ORDER — ENOXAPARIN SODIUM 40 MG/0.4ML IJ SOSY: 40.0000 mg | PREFILLED_SYRINGE | INTRAMUSCULAR | Status: DC

## 2024-11-26 MED ORDER — ACETAMINOPHEN 325 MG PO TABS: 650.0000 mg | ORAL_TABLET | Freq: Four times a day (QID) | ORAL | Status: DC | PRN

## 2024-11-26 MED ORDER — METRONIDAZOLE 500 MG/100ML IV SOLN
500.0000 mg | Freq: Once | INTRAVENOUS | Status: AC
  Administered 2024-11-26: 500 mg via INTRAVENOUS
  Filled 2024-11-26: qty 100

## 2024-11-26 MED ORDER — FENTANYL CITRATE (PF) 50 MCG/ML IJ SOSY
12.5000 ug | PREFILLED_SYRINGE | INTRAMUSCULAR | Status: DC | PRN
  Administered 2024-11-27: 12.5 ug via INTRAVENOUS
  Administered 2024-11-27: 50 ug via INTRAVENOUS
  Administered 2024-11-28: 25 ug via INTRAVENOUS
  Administered 2024-11-28 – 2024-11-30 (×7): 50 ug via INTRAVENOUS
  Filled 2024-11-26 (×14): qty 1

## 2024-11-26 MED ORDER — SODIUM CHLORIDE 0.9 % IV SOLN
2.0000 g | Freq: Every day | INTRAVENOUS | Status: DC
  Administered 2024-11-27 – 2024-11-28 (×2): 2 g via INTRAVENOUS
  Filled 2024-11-26 (×2): qty 20

## 2024-11-26 MED ORDER — ONDANSETRON HCL 4 MG PO TABS: 4.0000 mg | ORAL_TABLET | Freq: Four times a day (QID) | ORAL | Status: DC | PRN

## 2024-11-26 NOTE — ED Provider Notes (Signed)
 [place] EMERGENCY DEPARTMENT AT [facility] Provider Note   CSN: [id number] Arrival date & time: 11/26/24  9147     Patient presents with: Back Pain   Maria [name] is a 68 y.o. female with past medical history of anxiety, depression, hypertension, GERD, chronic low back pain, spinal stenosis, osteoarthritis, diabetes, CHF, who presents emergency department for evaluation of acute low back pain.  Patient reports that she was recently seen in the emergency department for evaluation of hip pain.  She was undergoing outpatient physical therapy/rehab and finished 2 days ago.  However, beginning this morning, her pain acutely worsened.  She describes it as an intense and squeezing but sometimes radiating pain.  She denies any bladder or bowel incontinence.  She states she has typically been able to walk with a walker, but has been unable to ambulate due to this pain.  She is also tearful and unable to sit comfortably due to the pain.    Back Pain      Prior to Admission medications   Medication Sig Start Date End Date Taking? Authorizing Provider  albuterol  (VENTOLIN  HFA) 108 (90 Base) MCG/ACT inhaler Inhale 1 puff into the lungs every 6 (six) hours as needed for wheezing or shortness of breath.    [provider]  cholecalciferol  (VITAMIN D3) 25 MCG (1000 UNIT) tablet Take 1,000 Units by mouth daily.    [provider]  clonazePAM  (KLONOPIN ) 1 MG tablet Take 1 tablet (1 mg total) by mouth 2 (two) times daily as needed for up to 5 days. for anxiety 09/12/24 11/14/24  Cherlyn Labella, MD  cyanocobalamin  1000 MCG tablet Take 1 tablet (1,000 mcg total) by mouth daily. 07/18/23   [name], MD  diclofenac  Sodium (VOLTAREN ) 1 % GEL Apply 4 g topically 4 (four) times daily. 09/12/24   Akula, Vijaya, MD  diclofenac  Sodium (VOLTAREN ) 1 % GEL Apply 4 g topically 4 (four) times daily. 10/30/24   Ula Prentice SAUNDERS, MD  famotidine  (PEPCID ) 40 MG tablet Take 40 mg by mouth  at bedtime. 10/04/23   [provider]  feeding supplement (ENSURE PLUS HIGH PROTEIN) LIQD Take 237 mLs by mouth 2 (two) times daily between meals. Patient taking differently: Take 237 mLs by mouth 2 (two) times daily between meals. Patient receives this if she asks 09/12/24 12/11/24  Cherlyn Labella, MD  fluticasone  (FLONASE ) 50 MCG/ACT nasal spray Place 1 spray into both nostrils daily. 12/07/22   [provider]  lidocaine  (LIDODERM ) 5 % Place 1 patch onto the skin daily. Remove & Discard patch within 12 hours or as directed by MD 10/30/24   Ula Prentice SAUNDERS, MD  methocarbamol  (ROBAXIN ) 500 MG tablet Take 1 tablet (500 mg total) by mouth every 6 (six) hours as needed for muscle spasms. 09/12/24   Akula, Vijaya, MD  metolazone  (ZAROXOLYN ) 2.5 MG tablet Take 2.5 mg by mouth once a week. 08/28/24   [provider]  metoprolol  succinate (TOPROL -XL) 25 MG 24 hr tablet Take 1 tablet (25 mg total) by mouth daily. 07/18/23   [name], MD  NARCAN  4 MG/0.1ML LIQD nasal spray kit Place 0.4 mg into the nose daily as needed (opioid overdose). 04/25/19   [provider]  nystatin  (MYCOSTATIN /NYSTOP ) powder Apply 1 Application topically daily. 01/22/24   [provider]  ondansetron  (ZOFRAN -ODT) 4 MG disintegrating tablet Take 4 mg by mouth every 8 (eight) hours as needed for nausea or vomiting (dissolve the tablet orally). 09/14/23  [provider]  pantoprazole  (PROTONIX ) 40 MG tablet TAKE ONE TABLET BY MOUTH DAILY 01/29/22   [name], MD  polyethylene glycol (MIRALAX  / GLYCOLAX ) 17 g packet Take 17 g by mouth daily. 09/13/24   Akula, Vijaya, MD  potassium chloride  SA (KLOR-CON  M) 20 MEQ tablet Take 40 mEq by mouth daily. 08/29/24   [provider]  pramipexole  (MIRAPEX ) 0.5 MG tablet Take 0.5 mg by mouth 3 (three) times daily. 12/16/23   [provider]  spironolactone  (ALDACTONE ) 25 MG tablet Take 25 mg by mouth daily. 09/21/24   [provider]  tirzepatide CLOYDE) 15 MG/0.5ML Pen Inject 15 mg into the skin once a week. Patient not taking: Reported on 11/14/2024    [provider]  torsemide  (DEMADEX ) 20 MG tablet Take 1 tablet (20 mg total) by mouth daily. Hold if SBP is less than 100. Keep MAP >75 MM HG. Patient taking differently: Take 40 mg by mouth 2 (two) times daily. Hold if SBP is less than 100. Keep MAP >75 MM HG. 09/12/24   Cherlyn Labella, MD  traZODone  (DESYREL ) 50 MG tablet Take 0.5 tablets (25 mg total) by mouth at bedtime as needed for up to 20 days for sleep. 09/12/24 11/14/24  Akula, Vijaya, MD    Allergies: Amoxicillin -pot clavulanate, Adhesive [tape], Codeine, Crestor  [rosuvastatin  calcium ], Cyclobenzaprine , Cymbalta  [duloxetine  hcl], Gabapentin , Lexapro [escitalopram], Lyrica [pregabalin], Morphine  and codeine, Vicodin [hydrocodone-acetaminophen ], and Latex    Review of Systems  Musculoskeletal:  Positive for back pain.    Updated Vital Signs BP (!) 154/58 (BP Location: Right Arm)   Pulse 85   Temp (!) 97.5 F (36.4 C) (Oral)   Resp 18   SpO2 100%   Physical Exam Vitals and nursing note reviewed.  Constitutional:      Appearance: Normal appearance. She is obese.  HENT:     Head: Normocephalic and atraumatic.     Mouth/Throat:     Mouth: Mucous membranes are moist.  Eyes:     General: No scleral icterus.       Right eye: No discharge.        Left eye: No discharge.     Conjunctiva/sclera: Conjunctivae normal.  Cardiovascular:     Rate and Rhythm: Normal rate and regular rhythm.     Pulses: Normal pulses.  Pulmonary:     Effort: Pulmonary effort is normal.     Breath sounds: Normal breath sounds.  Abdominal:     General: There is no distension.     Tenderness: There is no abdominal tenderness. There is left CVA tenderness.     Comments: Patient with left CVA tenderness.  When palpating her low back, she is also reporting pain down to the left side of her groin.   Musculoskeletal:        General: No deformity.     Cervical back: Normal range of motion.     Right lower leg: Edema present.     Left lower leg: Edema present.     Comments: Patient with positive straight leg test on the left.  It she reports the pain goes down to her calf.  She also has 3+ pitting edema on her bilateral lower extremities.  Skin:    General: Skin is warm and dry.     Capillary Refill: Capillary refill takes less than 2 seconds.  Neurological:     Mental Status: She is alert.     Motor: No weakness.  Psychiatric:  Mood and Affect: Mood normal.     (all labs ordered are listed, but only abnormal results are displayed) Labs Reviewed  URINALYSIS, ROUTINE W REFLEX MICROSCOPIC - Abnormal; Notable for the following components:      Result Value   Specific Gravity, Urine 1.004 (*)    Nitrite POSITIVE (*)    Bacteria, UA MANY (*)    All other components within normal limits  BASIC METABOLIC PANEL WITH GFR - Abnormal; Notable for the following components:   Creatinine, Ser 1.03 (*)    GFR, Estimated 59 (*)    All other components within normal limits  CBC WITH DIFFERENTIAL/PLATELET - Abnormal; Notable for the following components:   WBC 11.7 (*)    RBC 3.83 (*)    Hemoglobin 11.4 (*)    HCT 35.2 (*)    Neutro Abs 8.1 (*)    All other components within normal limits    EKG: None  Radiology: CT Lumbar Spine Wo Contrast Result Date: 11/26/2024 EXAM: CT OF THE LUMBAR SPINE WITHOUT CONTRAST 11/26/2024 11:10:09 AM TECHNIQUE: CT of the lumbar spine was performed without the administration of intravenous contrast. Multiplanar reformatted images are provided for review. Automated exposure control, iterative reconstruction, and/or weight based adjustment of the mA/kV was utilized to reduce the radiation dose to as low as reasonably achievable. COMPARISON: MR lumbar spine without contrast 09/02/2024 and CT lumbar spine without contrast 1925. CLINICAL HISTORY: acute low  back pain FINDINGS: BONES AND ALIGNMENT: Normal vertebral body heights. Transitional L5 segment is noted. Grade 1 degenerative anterolisthesis is present at L3-L4 and L4-L5. No acute fracture or suspicious bone lesion. Normal alignment. DEGENERATIVE CHANGES: * **L1-L2:** Moderate facet hypertrophy is present without focal disc protrusion or stenosis. * **L2-L3:** A far left lateral disc protrusion is again noted and stable moderate left greater than right foraminal stenosis. * **L3-L4:** Uncovertebral broad-based disc protrusion and advanced facet hypertrophy results in severe right central canal stenosis. Moderate-to-severe right and moderate left foraminal stenosis is stable. * **L4-L5:** A broad-based disc protrusion and advanced facet hypertrophy is present with moderate central canal stenosis. Left greater than right subarticular urine is present. Moderate foraminal stenosis is worse on the left. * **L5-S1:** Facet spurring is present without focal disc protrusion or stenosis. SOFT TISSUES: Atherosclerotic calcifications are present in the aorta without aneurysm. No acute abnormality. IMPRESSION: 1. 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 Stable moderate-to-severe right and moderate left foraminal stenosis. 2. Moderate central canal stenosis at L4-5 due to broad-based disc protrusion and advanced facet hypertrophy. Left greater than right subarticular recess narrowing is present. Moderate foraminal stenosis is worse on the left. 3. Stable far left lateral disc protrusion at L2-3 with moderate left greater than right foraminal stenosis. Electronically signed by: Lonni Necessary MD 11/26/2024 11:28 AM EST RP Workstation: [id number]     Procedures   Medications Ordered in the ED  metroNIDAZOLE  (FLAGYL ) IVPB 500 mg (has no administration in time range)  morphine  (PF) 4 MG/ML injection 4 mg (4 mg Intravenous Given 11/26/24 1044)   ondansetron  (ZOFRAN ) injection 4 mg (4 mg Intravenous Given 11/26/24 1044)  cefTRIAXone  (ROCEPHIN ) 2 g in sodium chloride  0.9 % 100 mL IVPB (2 g Intravenous New Bag/Given 11/26/24 1244)  morphine  (PF) 4 MG/ML injection 4 mg (4 mg Intravenous Given 11/26/24 1249)    Clinical Course as of 11/26/24 1318  Sun Nov 26, 2024  1054 Patient reports acute low back pain that started this  morning.  She states she is unable to lay still and she is tearful on exam.  Physical exam is notable for positive straight leg raise as well as left-sided CVA tenderness.  Basic labs and UA have been ordered.  I did order CT lumbar spine.  I also ordered IV morphine  for patient's pain control. [GD]  1235 Upon reevaluation, patient has a mild leukocytosis of 11.7.  Her urine is also nitrite positive with many bacteria.  Her lumbar spine is without abnormality.  I reevaluated this patient with Dr. Charlyn and patient now has tenderness to her left lower quadrant in addition to her back.  Given patient's symptoms, she will require admission for presumed pyelonephritis and possible diverticulitis.  I started IV Rocephin  and Flagyl .  Consult to hospitalist has been placed. [GD]  1315 I spoke with Dr. Roxane regarding patient's admission and he has a recommendation to obtain a CT of her abdomen and pelvis.  Given patient's leukocytosis and pain, it is possible she may have pyelonephritis and/or diverticulitis.  CT scan will help us  rule in those infections.  Patient will be admitted to medicine. [GD]    Clinical Course User Index [GD] Torrence Marry RAMAN, PA-C                               Medical Decision Making Amount and/or Complexity of Data Reviewed Labs: ordered. Radiology: ordered.  Risk Prescription drug management. Decision regarding hospitalization.   68 year old female who presents emergency department for evaluation of back pain.  Differential diagnosis: Cauda equina, spinal stenosis, disc herniation, muscle  strain, sciatic pain, radiculopathy, septic arthritis    Final diagnoses:  Pyelonephritis    ED Discharge Orders     None          Torrence Marry RAMAN, PA-C 11/26/24 1318    Charlyn Sora, MD 11/27/24 [phone]

## 2024-11-26 NOTE — ED Notes (Signed)
 Dr. Zella informed that blood cultures were ordered AFTER antibiotics were administered.   Blood cultures to be cancelled.

## 2024-11-26 NOTE — H&P (Signed)
 History and Physical  Maria [name] FMW:[id number] DOB: 1956-04-07 DOA: 11/26/2024  PCP: Ileen [name], NP   Chief Complaint: Abdominal pain, back pain, frequent urination  HPI: Maria [name] is a 68 y.o. female with medical history significant for morbid obesity, depression, hypertension, GERD, chronic low back pain, diabetes, heart failure with preserved EF who presents to the emergency department with acute low back pain and left lower quadrant abdominal pain found to have UTI.  States that for the last couple days, she has had increased urination, but no burning.  She has had some acute low back pain which is her similar back pain that she has chronically, but is worse, she also has some bilateral lower quadrant abdominal pain, worse on the left.  Denies any blood in her stool, fevers, nausea, vomiting, chest pain or shortness of breath.  It feels like she has a UTI, though the increased pain is not typical for a UTI.  Review of Systems: Please see HPI for pertinent positives and negatives. A complete 10 system review of systems are otherwise negative.  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 2010   CERVICAL DISC SURGERY  may 24th 2010   COLONOSCOPY WITH PROPOFOL  N/A 07/10/2016   Procedure: COLONOSCOPY WITH PROPOFOL ;  Surgeon: Elspeth Deward Naval, MD;  Location: [facility];  Service: Gastroenterology;  Laterality: N/A;   COLONOSCOPY WITH PROPOFOL  N/A 09/26/2019   Procedure: COLONOSCOPY WITH PROPOFOL ;  Surgeon: Naval Elspeth SQUIBB, MD;  Location: [facility];  Service: Gastroenterology;  Laterality: N/A;   ESOPHAGOGASTRODUODENOSCOPY (EGD) WITH PROPOFOL  N/A 07/10/2016   Procedure: ESOPHAGOGASTRODUODENOSCOPY (EGD) WITH PROPOFOL ;  Surgeon: Elspeth Deward Naval, MD;  Location: [facility];  Service: Gastroenterology;  Laterality: N/A;   POLYPECTOMY  09/26/2019   Procedure: POLYPECTOMY;  Surgeon: Naval Elspeth SQUIBB, MD;  Location: [facility];  Service: Gastroenterology;;   RADIOLOGY WITH ANESTHESIA N/A 07/30/2023   Procedure: MRI WITH ANESTHESIA - THORACIC LUMBAR WITHOUT CONSTRAST;  Surgeon: Radiologist, Medication, MD;  Location: [facility] OR;  Service: Radiology;  Laterality: N/A;   s/p ovary cyst     s/p right knee arthroscopy     Dr. Josefina ortho   Social History:  reports that she quit smoking about 16 years ago. Her smoking use included cigarettes. She started smoking about 46 years ago. She has never used smokeless tobacco. She reports that she does not drink alcohol  and does not use drugs.  Allergies  Allergen Reactions   Amoxicillin -Pot Clavulanate Nausea And Vomiting and Other (See Comments)    Projectile vomiting   Adhesive [Tape] Other (See Comments)    Tears skin off - use paper tape    Codeine Other (See Comments)    hallucinations   Crestor  [Rosuvastatin  Calcium ] Other (See Comments)     Severe muscle cramps   Cyclobenzaprine  Other (See Comments)    Hallucinations    Cymbalta  [Duloxetine  Hcl] Other (See Comments)    Hallucinations    Gabapentin  Other (See Comments)    Trembling   Lexapro [Escitalopram] Other (See Comments)    Hallucinations    Lyrica [Pregabalin] Other (See Comments)    weakness   Morphine  And Codeine Nausea And Vomiting and Other (See Comments)    Migraine headaches   Vicodin [Hydrocodone-Acetaminophen ] Other (See Comments)    hallucinations   Latex Rash    Family History  Problem Relation Age of Onset   Heart disease Mother    Hypertension Mother    Diabetes Mother    Heart failure Mother    Asthma Sister    Anxiety disorder Sister    Depression Sister    Hypertension Father    Asthma Daughter    Bipolar disorder Daughter    Cancer Maternal Uncle        [place]   Cancer Other        ovarian   Liver cancer Paternal Grandmother        ????     Prior to Admission medications   Medication Sig Start Date End Date Taking? Authorizing Provider  albuterol  (VENTOLIN  HFA) 108 (90 Base) MCG/ACT inhaler Inhale 1 puff into the lungs every 6 (six) hours as needed for wheezing or shortness of breath.    [provider]  cholecalciferol  (VITAMIN D3) 25 MCG (1000 UNIT) tablet Take 1,000 Units by mouth daily.    [provider]  clonazePAM  (KLONOPIN ) 1 MG tablet Take 1 tablet (1 mg total) by mouth 2 (two) times daily as needed for up to 5 days. for anxiety 09/12/24 11/14/24  Cherlyn Labella, MD  cyanocobalamin  1000 MCG tablet Take 1 tablet (1,000 mcg total) by mouth daily. 07/18/23   [name], MD  diclofenac  Sodium (VOLTAREN ) 1 % GEL Apply 4 g topically 4 (four) times daily. 09/12/24   Akula, Vijaya, MD  diclofenac  Sodium (VOLTAREN ) 1 % GEL Apply 4 g topically 4 (four) times daily. 10/30/24   Ula Prentice SAUNDERS, MD  famotidine  (PEPCID ) 40 MG tablet Take 40 mg by mouth at bedtime. 10/04/23   [provider]  feeding supplement  (ENSURE PLUS HIGH PROTEIN) LIQD Take 237 mLs by mouth 2 (two) times daily between meals. Patient taking differently: Take 237 mLs by mouth 2 (two) times daily between meals. Patient receives this if she asks 09/12/24 12/11/24  Cherlyn Labella, MD  fluticasone  (FLONASE ) 50 MCG/ACT nasal spray Place 1 spray into both nostrils daily. 12/07/22   [provider]  lidocaine  (LIDODERM ) 5 % Place 1 patch onto the skin daily. Remove & Discard patch within 12 hours or as directed by MD 10/30/24   Ula Prentice SAUNDERS, MD  methocarbamol  (ROBAXIN ) 500 MG tablet Take 1 tablet (500 mg total) by mouth every 6 (six) hours as needed for muscle spasms. 09/12/24   Akula, Vijaya, MD  metolazone  (ZAROXOLYN ) 2.5 MG tablet Take 2.5 mg by mouth once a week. 08/28/24   [provider]  metoprolol  succinate (TOPROL -XL) 25 MG 24 hr tablet  Take 1 tablet (25 mg total) by mouth daily. 07/18/23   [name], MD  NARCAN  4 MG/0.1ML LIQD nasal spray kit Place 0.4 mg into the nose daily as needed (opioid overdose). 04/25/19   [provider]  nystatin  (MYCOSTATIN /NYSTOP ) powder Apply 1 Application topically daily. 01/22/24   [provider]  ondansetron  (ZOFRAN -ODT) 4 MG disintegrating tablet Take 4 mg by mouth every 8 (eight) hours as needed for nausea or vomiting (dissolve the tablet orally). 09/14/23   [provider]  pantoprazole  (PROTONIX ) 40 MG tablet TAKE ONE TABLET BY MOUTH DAILY 01/29/22   [name], MD  polyethylene glycol (MIRALAX  / GLYCOLAX ) 17 g packet Take 17 g by mouth daily. 09/13/24   Akula, Vijaya, MD  potassium chloride  SA (KLOR-CON  M) 20 MEQ tablet Take 40 mEq by mouth daily. 08/29/24   [provider]  pramipexole  (MIRAPEX ) 0.5 MG tablet Take 0.5 mg by mouth 3 (three) times daily. 12/16/23   [provider]  spironolactone  (ALDACTONE ) 25 MG tablet Take 25 mg by mouth daily. 09/21/24   [provider]  tirzepatide CLOYDE) 15 MG/0.5ML Pen Inject 15 mg into  the skin once a week. Patient not taking: Reported on 11/14/2024    [provider]  torsemide  (DEMADEX ) 20 MG tablet Take 1 tablet (20 mg total) by mouth daily. Hold if SBP is less than 100. Keep MAP >75 MM HG. Patient taking differently: Take 40 mg by mouth 2 (two) times daily. Hold if SBP is less than 100. Keep MAP >75 MM HG. 09/12/24   Cherlyn Labella, MD  traZODone  (DESYREL ) 50 MG tablet Take 0.5 tablets (25 mg total) by mouth at bedtime as needed for up to 20 days for sleep. 09/12/24 11/14/24  Cherlyn Labella, MD    Physical Exam: BP (!) 154/58 (BP Location: Right Arm)   Pulse 85   Temp (!) 97.5 F (36.4 C) (Oral)   Resp 18   SpO2 100%  General:  Alert, oriented, calm, in no acute distress  Eyes: EOMI, clear conjuctivae, white sclerea Neck: supple, no masses, trachea mildline  Cardiovascular: RRR, no murmurs or rubs, no peripheral edema  Respiratory: clear to auscultation bilaterally, no wheezes, no crackles  Abdomen: soft, tender in the bilateral lower quadrants, especially in the left lower quadrant, with some voluntary guarding but no rebound tenderness, nondistended, normal bowel tones heard  Skin: dry, no rashes  Musculoskeletal: no joint effusions, normal range of motion  Psychiatric: appropriate affect, normal speech  Neurologic: extraocular muscles intact, clear speech, moving all extremities with intact sensorium         Labs on Admission:  Basic Metabolic Panel: Recent Labs  Lab 11/26/24 1049  NA 138  K 4.3  CL 102  CO2 27  GLUCOSE 85  BUN 13  CREATININE 1.03*  CALCIUM  9.6   Liver Function Tests: No results for input(s): AST, ALT, ALKPHOS, BILITOT, PROT, ALBUMIN  in the last 168 hours. No results for input(s): LIPASE, AMYLASE in the last 168 hours. No results for input(s): AMMONIA in the last 168 hours. CBC: Recent Labs  Lab 11/26/24 1134  WBC 11.7*  NEUTROABS 8.1*  HGB 11.4*  HCT 35.2*  MCV 91.9  PLT 246   Cardiac  Enzymes: No results for input(s): CKTOTAL, CKMB, CKMBINDEX, TROPONINI in the last 168 hours. BNP (last 3 results) Recent Labs    04/26/24 1556 04/27/24 0412 04/27/24 1740  BNP 31.4 29.6 52.8    ProBNP (last 3 results) Recent Labs  10/24/24 0430 10/26/24 0656  PROBNP 151.0 96.9    CBG: No results for input(s): GLUCAP in the last 168 hours.  Radiological Exams on Admission: CT Lumbar Spine Wo Contrast Result Date: 11/26/2024 EXAM: CT OF THE LUMBAR SPINE WITHOUT CONTRAST 11/26/2024 11:10:09 AM TECHNIQUE: CT of the lumbar spine was performed without the administration of intravenous contrast. Multiplanar reformatted images are provided for review. Automated exposure control, iterative reconstruction, and/or weight based adjustment of the mA/kV was utilized to reduce the radiation dose to as low as reasonably achievable. COMPARISON: MR lumbar spine without contrast 09/02/2024 and CT lumbar spine without contrast 1925. CLINICAL HISTORY: acute low back pain FINDINGS: BONES AND ALIGNMENT: Normal vertebral body heights. Transitional L5 segment is noted. Grade 1 degenerative anterolisthesis is present at L3-L4 and L4-L5. No acute fracture or suspicious bone lesion. Normal alignment. DEGENERATIVE CHANGES: * **L1-L2:** Moderate facet hypertrophy is present without focal disc protrusion or stenosis. * **L2-L3:** A far left lateral disc protrusion is again noted and stable moderate left greater than right foraminal stenosis. * **L3-L4:** Uncovertebral broad-based disc protrusion and advanced facet hypertrophy results in severe right central canal stenosis. Moderate-to-severe right and moderate left foraminal stenosis is stable. * **L4-L5:** A broad-based disc protrusion and advanced facet hypertrophy is present with moderate central canal stenosis. Left greater than right subarticular urine is present. Moderate foraminal stenosis is worse on the left. * **L5-S1:** Facet spurring is present  without focal disc protrusion or stenosis. SOFT TISSUES: Atherosclerotic calcifications are present in the aorta without aneurysm. No acute abnormality. IMPRESSION: 1. 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 Stable moderate-to-severe right and moderate left foraminal stenosis. 2. Moderate central canal stenosis at L4-5 due to broad-based disc protrusion and advanced facet hypertrophy. Left greater than right subarticular recess narrowing is present. Moderate foraminal stenosis is worse on the left. 3. Stable far left lateral disc protrusion at L2-3 with moderate left greater than right foraminal stenosis. Electronically signed by: Lonni Necessary MD 11/26/2024 11:28 AM EST RP Workstation: [id number]   Assessment/Plan Heron LABOR Mcclay is a 68 y.o. female with medical history significant for morbid obesity, depression, hypertension, GERD, chronic low back pain, diabetes, heart failure with preserved EF who presents to the emergency department with acute low back pain and left lower quadrant abdominal pain found to have UTI.    UTI-with increased frequency of urination, she feels like she has a UTI.  She has mild leukocytosis, but otherwise not meeting sepsis criteria.  She is hemodynamically stable.  She has a history of Klebsiella UTI. -Inpatient admission -Empiric IV Rocephin  -Follow blood and urine culture  Abdominal pain-unclear etiology at this time, could be due to her UTI, but she has significant tenderness to palpation in the left lower quadrant of the abdomen.  Discussed with ER provider will check CT abdomen pelvis with contrast to rule out pyelo-, stone or diverticulitis. -Follow-up CT abdomen pelvis with IV contrast  Chronic heart failure with preserved EF-patient appears euvolemic, without obvious evidence of acute heart failure -Continue home Toprol -XL, torsemide , and Aldactone   Chronic low back pain-patient has  known spinal disease, CT as above with severe central canal stenosis with broad-based disc protrusion.  She would benefit from outpatient orthopedic/spine evaluation and consult -Continue home pain control with oxycodone  as needed  Type 2 diabetes-on Mounjaro as an outpatient -Carb modified heart healthy diet -Moderate dose sliding scale  DVT prophylaxis: Lovenox      Code Status: Full Code  Consults called: None  Admission status: The appropriate patient status for this patient is INPATIENT. Inpatient status is judged to be reasonable and necessary in order to provide the required intensity of service to ensure the patient's safety. The patient's presenting symptoms, physical exam findings, and initial radiographic and laboratory data in the context of their chronic comorbidities is felt to place them at high risk for further clinical deterioration. Furthermore, it is not anticipated that the patient will be medically stable for discharge from the hospital within 2 midnights of admission.    I certify that at the point of admission it is my clinical judgment that the patient will require inpatient hospital care spanning beyond 2 midnights from the point of admission due to high intensity of service, high risk for further deterioration and high frequency of surveillance required  Time spent: 59 minutes  Wava Kildow CHRISTELLA Gail MD [facility] Pager [phone]  If 7PM-7AM, please contact night-coverage [email or web address] Password [email or web address]  11/26/2024, 1:24 PM

## 2024-11-26 NOTE — ED Triage Notes (Signed)
 Patient BIB EMS from home c/o worsening back pain, recently in rehab for back pain, Dcd on 12/5, usually ambulatory with walker but now not able to bear weight from pain, denies urinary symptoms/radiation/fevers/cough/chills/SOB. 3+ pitting edema BLE. Patient has not taken anything for pain today. Patient is alert and oriented x 4. Airway patent, respirations even and unlabored. Skin normal, warm and dry. Siderails up x 2. Call light at bedside.

## 2024-11-26 NOTE — Plan of Care (Signed)

## 2024-11-27 LAB — BASIC METABOLIC PANEL WITH GFR
Anion gap: 9 (ref 5–15)
BUN: 10 mg/dL (ref 8–23)
CO2: 27 mmol/L (ref 22–32)
Calcium: 8.8 mg/dL — ABNORMAL LOW (ref 8.9–10.3)
Chloride: 106 mmol/L (ref 98–111)
Creatinine, Ser: 0.91 mg/dL (ref 0.44–1.00)
GFR, Estimated: >60 mL/min (ref >60)
Glucose, Bld: 78 mg/dL (ref 70–99)
Potassium: 4 mmol/L (ref 3.5–5.1)
Sodium: 142 mmol/L (ref 135–145)

## 2024-11-27 LAB — URINE CULTURE

## 2024-11-27 LAB — CBC
HCT: 35 % — ABNORMAL LOW (ref 36.0–46.0)
Hemoglobin: 11.1 g/dL — ABNORMAL LOW (ref 12.0–15.0)
MCH: 29.8 pg (ref 26.0–34.0)
MCHC: 31.7 g/dL (ref 30.0–36.0)
MCV: 93.8 fL (ref 80.0–100.0)
Platelets: 211 K/uL (ref 150–400)
RBC: 3.73 MIL/uL — ABNORMAL LOW (ref 3.87–5.11)
RDW: 13.7 % (ref 11.5–15.5)
WBC: 6.4 K/uL (ref 4.0–10.5)
nRBC: 0 % (ref 0.0–0.2)

## 2024-11-27 LAB — GLUCOSE, CAPILLARY
Glucose-Capillary: 81 mg/dL (ref 70–99)
Glucose-Capillary: 73 mg/dL (ref 70–99)
Glucose-Capillary: 98 mg/dL (ref 70–99)
Glucose-Capillary: 128 mg/dL — ABNORMAL HIGH (ref 70–99)

## 2024-11-27 MED ORDER — ORAL CARE MOUTH RINSE: 15.0000 mL | OROMUCOSAL | Status: DC | PRN

## 2024-11-27 MED ORDER — FAMOTIDINE 20 MG PO TABS
40.0000 mg | ORAL_TABLET | Freq: Every day | ORAL | Status: DC
  Administered 2024-11-27 – 2024-11-29 (×3): 40 mg via ORAL
  Filled 2024-11-27 (×3): qty 2

## 2024-11-27 MED ORDER — SORBITOL 70 % SOLN
30.0000 mL | Status: DC
  Filled 2024-11-27 (×2): qty 30

## 2024-11-27 MED ORDER — KETOROLAC TROMETHAMINE 30 MG/ML IJ SOLN
30.0000 mg | Freq: Four times a day (QID) | INTRAMUSCULAR | Status: DC | PRN
  Administered 2024-11-27: 30 mg via INTRAVENOUS
  Filled 2024-11-27: qty 1

## 2024-11-27 MED ORDER — LIDOCAINE 5 % EX PTCH
1.0000 | MEDICATED_PATCH | CUTANEOUS | Status: DC
  Administered 2024-11-27 – 2024-11-30 (×4): 1 via TRANSDERMAL
  Filled 2024-11-27 (×4): qty 1

## 2024-11-27 MED ORDER — OXYCODONE HCL 5 MG PO TABS: 5.0000 mg | ORAL_TABLET | ORAL | Status: DC | PRN

## 2024-11-27 MED ORDER — PANTOPRAZOLE SODIUM 40 MG PO TBEC
40.0000 mg | DELAYED_RELEASE_TABLET | Freq: Every day | ORAL | Status: DC
  Administered 2024-11-27 – 2024-11-30 (×4): 40 mg via ORAL
  Filled 2024-11-27 (×4): qty 1

## 2024-11-27 MED ORDER — ENSURE PLUS HIGH PROTEIN PO LIQD
237.0000 mL | Freq: Two times a day (BID) | ORAL | Status: DC
  Administered 2024-11-28 (×2): 237 mL via ORAL

## 2024-11-27 MED ORDER — METHOCARBAMOL 500 MG PO TABS
500.0000 mg | ORAL_TABLET | Freq: Four times a day (QID) | ORAL | Status: DC | PRN
  Administered 2024-11-27 – 2024-11-28 (×3): 500 mg via ORAL
  Filled 2024-11-27 (×3): qty 1

## 2024-11-27 MED ORDER — POLYETHYLENE GLYCOL 3350 17 G PO PACK
17.0000 g | PACK | Freq: Two times a day (BID) | ORAL | Status: DC
  Administered 2024-11-29 – 2024-11-30 (×3): 17 g via ORAL
  Filled 2024-11-27 (×5): qty 1

## 2024-11-27 MED ORDER — FLUTICASONE PROPIONATE 50 MCG/ACT NA SUSP
1.0000 | Freq: Every day | NASAL | Status: DC
  Administered 2024-11-29 – 2024-11-30 (×2): 1 via NASAL
  Filled 2024-11-27: qty 16

## 2024-11-27 MED ORDER — POLYETHYLENE GLYCOL 3350 17 G PO PACK: 17.0000 g | PACK | Freq: Every day | ORAL | Status: DC

## 2024-11-27 MED ORDER — TORSEMIDE 20 MG PO TABS
40.0000 mg | ORAL_TABLET | Freq: Two times a day (BID) | ORAL | Status: DC
  Administered 2024-11-28 – 2024-11-30 (×3): 40 mg via ORAL
  Filled 2024-11-27 (×6): qty 2

## 2024-11-27 MED ORDER — VITAMIN B-12 1000 MCG PO TABS
1000.0000 ug | ORAL_TABLET | Freq: Every day | ORAL | Status: DC
  Administered 2024-11-28 – 2024-11-30 (×3): 1000 ug via ORAL
  Filled 2024-11-27 (×4): qty 1

## 2024-11-27 MED ORDER — OXYCODONE HCL 5 MG PO TABS
10.0000 mg | ORAL_TABLET | ORAL | Status: DC | PRN
  Administered 2024-11-27 – 2024-11-30 (×9): 10 mg via ORAL
  Filled 2024-11-27 (×10): qty 2

## 2024-11-27 MED ORDER — SORBITOL 70 % SOLN
30.0000 mL | Freq: Once | Status: DC
  Filled 2024-11-27 (×2): qty 30

## 2024-11-27 MED ORDER — METOLAZONE 2.5 MG PO TABS
2.5000 mg | ORAL_TABLET | ORAL | Status: DC
  Filled 2024-11-27: qty 1

## 2024-11-27 MED ORDER — PRAMIPEXOLE DIHYDROCHLORIDE 0.25 MG PO TABS
0.5000 mg | ORAL_TABLET | Freq: Three times a day (TID) | ORAL | Status: DC
  Administered 2024-11-27 – 2024-11-30 (×10): 0.5 mg via ORAL
  Filled 2024-11-27 (×13): qty 2

## 2024-11-27 MED ORDER — SPIRONOLACTONE 25 MG PO TABS
25.0000 mg | ORAL_TABLET | Freq: Every day | ORAL | Status: DC
  Administered 2024-11-27 – 2024-11-30 (×4): 25 mg via ORAL
  Filled 2024-11-27 (×4): qty 1

## 2024-11-27 MED ORDER — VITAMIN D 25 MCG (1000 UNIT) PO TABS
1000.0000 [IU] | ORAL_TABLET | Freq: Every day | ORAL | Status: DC
  Administered 2024-11-28 – 2024-11-30 (×3): 1000 [IU] via ORAL
  Filled 2024-11-27 (×4): qty 1

## 2024-11-27 MED ORDER — SENNOSIDES-DOCUSATE SODIUM 8.6-50 MG PO TABS
1.0000 | ORAL_TABLET | Freq: Two times a day (BID) | ORAL | Status: DC
  Administered 2024-11-27 – 2024-11-28 (×3): 1 via ORAL
  Filled 2024-11-27 (×3): qty 1

## 2024-11-27 MED ORDER — METOPROLOL SUCCINATE ER 25 MG PO TB24
25.0000 mg | ORAL_TABLET | Freq: Every day | ORAL | Status: DC
  Administered 2024-11-28 – 2024-11-30 (×3): 25 mg via ORAL
  Filled 2024-11-27 (×4): qty 1

## 2024-11-27 MED ORDER — POTASSIUM CHLORIDE CRYS ER 20 MEQ PO TBCR
40.0000 meq | EXTENDED_RELEASE_TABLET | Freq: Every day | ORAL | Status: DC
  Administered 2024-11-27 – 2024-11-30 (×4): 40 meq via ORAL
  Filled 2024-11-27 (×4): qty 2

## 2024-11-27 NOTE — Evaluation (Signed)
 Physical Therapy Evaluation Patient Details Name: Maria [name] MRN: [id number] DOB: 10-Apr-1956 Today's Date: 11/27/2024  History of Present Illness  68 y.o. female who presents to the emergency department with acute low back pain and left lower quadrant abdominal pain found to have UTI. Recent admission 11/8-10/1024 with back pain, pt DCed to SNF, then was home for 3 days prior to this admission. Pt with medical history significant for morbid obesity, depression, hypertension, GERD, chronic low back pain, diabetes, heart failure with preserved EF  Clinical Impression  Pt admitted with above diagnosis. Supervision for rolling and sidelying to sit. +2 mod assist sit to stand with RW, pt able to take 2 small side steps at edge of bed, then fatigued so returned to bed. Pt reports 8/10 back and L knee pain also limiting activity tolerance. Pt put forth good effort. Patient will benefit from continued inpatient follow up therapy, <3 hours/day.  Pt currently with functional limitations due to the deficits listed below (see PT Problem List). Pt will benefit from acute skilled PT to increase their independence and safety with mobility to allow discharge.           If plan is discharge home, recommend the following: Two people to help with walking and/or transfers;A lot of help with bathing/dressing/bathroom;Assistance with cooking/housework;Assist for transportation;Help with stairs or ramp for entrance   Can travel by private vehicle   No    Equipment Recommendations None recommended by PT  Recommendations for Other Services       Functional Status Assessment Patient has had a recent decline in their functional status and demonstrates the ability to make significant improvements in function in a reasonable and predictable amount of time.     Precautions / Restrictions Precautions Precautions: Fall Recall of Precautions/Restrictions: Intact Restrictions Weight Bearing Restrictions Per  Provider Order: No      Mobility  Bed Mobility Overal bed mobility: Needs Assistance Bed Mobility: Rolling, Sidelying to Sit, Sit to Supine Rolling: Supervision Sidelying to sit: Supervision, Used rails   Sit to supine: Mod assist   General bed mobility comments: VCs for technique, increased time/effort; assist for BLEs into bed    Transfers Overall transfer level: Needs assistance Equipment used: Rolling walker (2 wheels) Transfers: Sit to/from Stand Sit to Stand: Mod assist, +2 safety/equipment, +2 physical assistance           General transfer comment: assist to power up, pt took 2 small side steps to the R then fatigued    Ambulation/Gait                  Stairs            Wheelchair Mobility     Tilt Bed    Modified Rankin (Stroke Patients Only)       Balance Overall balance assessment: Needs assistance Sitting-balance support: Feet supported, No upper extremity supported Sitting balance-Leahy Scale: Fair     Standing balance support: Bilateral upper extremity supported, During functional activity, Reliant on assistive device for balance Standing balance-Leahy Scale: Poor                               Pertinent Vitals/Pain Pain Assessment Pain Assessment: 0-10 Pain Score: 8  Pain Location: back, L knee Pain Descriptors / Indicators: Grimacing, Sore, Guarding Pain Intervention(s): Limited activity within patient's tolerance, Monitored during session, Premedicated before session, Repositioned, Ice applied (ice to L knee)  Home Living Family/patient expects to be discharged to:: Private residence Living Arrangements: Alone Available Help at Discharge: Neighbor;Available PRN/intermittently Type of Home: Apartment Home Access: Level entry       Home Layout: One level Home Equipment: Hospital bed;Rollator (4 wheels);Wheelchair - manual;Tub bench;Adaptive equipment Additional Comments: neighbor helps with trash and  cooking    Prior Function Prior Level of Function : History of Falls (last six months)             Mobility Comments: was at [facility] for 3.5 weeks, then was home 3 days prior to this readmission; at home was struggling to walk with rollator ADLs Comments: BSC over toilet. alternate sponge bathing and recently getting in shower since now has a tub bench. Orders groceries, prescriptions, insurance provides transport to MD when needed     Extremity/Trunk Assessment   Upper Extremity Assessment Upper Extremity Assessment: Defer to OT evaluation    Lower Extremity Assessment Lower Extremity Assessment: Generalized weakness;RLE deficits/detail;LLE deficits/detail RLE Deficits / Details: knee ext +4/5, erythema noted BLEs (pt reports this is baseline since having cellulitis) RLE Sensation: WNL LLE Deficits / Details: knee ext -4/5, erythema noted BLEs (pt reports this is baseline since having cellulitis) LLE Sensation: WNL    Cervical / Trunk Assessment Cervical / Trunk Assessment: Normal  Communication   Communication Communication: No apparent difficulties    Cognition Arousal: Alert Behavior During Therapy: WFL for tasks assessed/performed   PT - Cognitive impairments: No apparent impairments                         Following commands: Intact       Cueing Cueing Techniques: Verbal cues     General Comments      Exercises General Exercises - Lower Extremity Ankle Circles/Pumps: AROM, Both, 10 reps, Supine Long Arc Quad: AROM, Both, 5 reps, Seated   Assessment/Plan    PT Assessment Patient needs continued PT services  PT Problem List Decreased activity tolerance;Decreased strength;Decreased mobility;Obesity;Pain;Decreased balance       PT Treatment Interventions Gait training;Therapeutic exercise;Therapeutic activities;DME instruction;Balance training;Functional mobility training;Patient/family education    PT Goals (Current goals can be  found in the Care Plan section)  Acute Rehab PT Goals Patient Stated Goal: suduku, word puzzles PT Goal Formulation: With patient Time For Goal Achievement: 12/11/24 Potential to Achieve Goals: Good    Frequency Min 2X/week     Co-evaluation PT/OT/SLP Co-Evaluation/Treatment: Yes Reason for Co-Treatment: Complexity of the patient's impairments (multi-system involvement);To address functional/ADL transfers PT goals addressed during session: Mobility/safety with mobility         AM-PAC PT 6 Clicks Mobility  Outcome Measure Help needed turning from your back to your side while in a flat bed without using bedrails?: A Little Help needed moving from lying on your back to sitting on the side of a flat bed without using bedrails?: A Little Help needed moving to and from a bed to a chair (including a wheelchair)?: Total Help needed standing up from a chair using your arms (e.g., wheelchair or bedside chair)?: A Lot Help needed to walk in hospital room?: Total Help needed climbing 3-5 steps with a railing? : Total 6 Click Score: 11    End of Session Equipment Utilized During Treatment: Gait belt Activity Tolerance: Patient limited by fatigue;Patient limited by pain Patient left: in bed;with call bell/phone within reach;with bed alarm set Nurse Communication: Mobility status;Need for lift equipment PT Visit Diagnosis: Difficulty in walking,  not elsewhere classified (R26.2);Pain;Unsteadiness on feet (R26.81);Muscle weakness (generalized) (M62.81);History of falling (Z91.81) Pain - Right/Left: Left Pain - part of body: Knee    Time: 8870-8841 PT Time Calculation (min) (ACUTE ONLY): 29 min   Charges:   PT Evaluation $PT Eval Moderate Complexity: 1 Mod PT Treatments $Therapeutic Activity: 8-22 mins PT General Charges $$ ACUTE PT VISIT: 1 Visit         Sylvan Delon Copp PT 11/27/2024  Acute Rehabilitation Services  Office [phone]

## 2024-11-27 NOTE — Plan of Care (Signed)

## 2024-11-27 NOTE — Progress Notes (Signed)
 Requested pharmacy to send up one time dose of medications that patient did not receive this morning while waiting on med requisition. Awaiting response from pharmacy. Will report to night shift RN.

## 2024-11-27 NOTE — Evaluation (Signed)
 Speech Language Pathology Evaluation Patient Details Name: Maria [name] MRN: [id number] DOB: [birth year] Today's Date: 11/27/2024 Time: 1453-1510 SLP Time Calculation (min) (ACUTE ONLY): 17 min  Problem List:  Patient Active Problem List   Diagnosis Date Noted   Meningioma (HCC) 11/14/2024   UTI (urinary tract infection) 09/01/2024   Generalized weakness 01/31/2024   Hypoglycemia 01/31/2024   Anemia of chronic disease 01/31/2024   Pressure injury of skin 11/26/2023   Hallucinations 09/02/2023   Stasis dermatitis of both legs 08/30/2023   Hypomagnesemia 08/02/2023   Ankle pain, right 08/01/2023   Weakness of both lower extremities 07/30/2023   Morbid obesity (HCC) 07/30/2023   (HFpEF) heart failure with preserved ejection fraction (HCC) 07/15/2023   Abdominal pain 07/15/2023   Acute renal failure superimposed on stage 3a chronic kidney disease (HCC) 07/15/2023   Acute on chronic respiratory failure with hypoxia and hypercapnia (HCC) 02/08/2023   Pneumonia due to infectious organism 02/05/2023   Chronic right-sided congestive heart failure (HCC) 02/04/2023   Fall 02/02/2023   Physical deconditioning 07/22/2022   Fluid overload 07/21/2022   Acute cystitis    Dyspnea 12/19/2021   Leg swelling 11/20/2021   Bilateral knee pain 10/29/2021   Hypernatremia 09/24/2021   Cellulitis of lower extremity 09/24/2021   CKD (chronic kidney disease), stage IIIa (HCC) 09/24/2021   Sacral decubitus ulcer, stage II (HCC) 09/20/2021   Acute on chronic heart failure with preserved ejection fraction (HFpEF) (HCC) 09/19/2021   Nail disorder 07/07/2021   Pre-ulcerative corn or callous 07/07/2021   Rash 07/07/2021   Chronic diastolic HF (heart failure) (HCC) 03/17/2021   DM2 (diabetes mellitus, type 2) (HCC) 03/11/2021   Vitamin D  deficiency 01/01/2021   Medial epicondylitis 12/31/2020   Aortic atherosclerosis 09/26/2020   Umbilical hernia 09/26/2020   Constipation 08/05/2020   Arthritis  06/12/2020   Hoarseness 05/01/2020   B12 deficiency 01/31/2020   Rosacea 01/31/2020   Neck swelling 01/31/2020   History of colonic polyps    Benign neoplasm of [place]    Nausea 08/31/2019   Throat pain 07/26/2019   HLD (hyperlipidemia) 05/24/2019   Leg cramps 05/24/2019   Lump in neck 05/24/2019   Left thyroid  nodule 01/25/2019   Jerking movements of extremities 07/20/2018   Balance disorder 07/20/2018   Right knee pain 03/21/2018   OSA (obstructive sleep apnea) 03/21/2018   Oxygen desaturation 03/21/2018   Urinary symptom or sign 03/21/2018   Lymphedema 01/25/2018   Gait disorder 01/25/2018   Dysphonia 12/07/2017   Encounter for well adult exam with abnormal findings 11/08/2017   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   [place] polyp 08/11/2016   Abdominal pain, epigastric    Dysphagia    [place] cancer screening    ACE-inhibitor cough 12/05/2015   Restless legs syndrome 10/08/2015   Venous stasis dermatitis of both lower extremities 04/26/2015   GERD without esophagitis 10/17/2014   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/17/2014   AR (allergic rhinitis) 10/17/2014   Fatty liver 10/17/2014   Chronic pain syndrome 09/19/2014   Post-traumatic osteoarthritis of both knees 09/19/2014   Spinal stenosis 09/19/2014   Depression, major, single episode, complete remission 09/19/2014   Narcotic dependence (HCC) 09/19/2014   Acute hypoxic respiratory failure (HCC) 08/20/2014   False positive HIV serology 06/02/2013   Hypokalemia 06/29/2012   Spondylosis of 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 03/09/2012   Lumbar disc disease 03/09/2012   Morbid obesity with body mass index (BMI) of 50.0 to 59.9 in adult [facility]) 06/17/2011   Chronic low  back pain 12/28/2008   GAD (generalized anxiety disorder) 12/06/2007   Depression 12/06/2007   Essential hypertension 12/06/2007   Gastroesophageal reflux disease without esophagitis 12/06/2007   LOW BACK PAIN 12/06/2007   Allergic  rhinitis 08/10/2007   Past Medical History:  Past Medical History:  Diagnosis Date   Acute lymphadenitis 2011   ALLERGIC RHINITIS 08/10/2007   ANXIETY 12/06/2007   Atherosclerotic peripheral vascular disease 06/13/2013   Aorta on CT June 2014   Cellulitis 2011   Cervical disc disease 03/09/2012   CHF (congestive heart failure) (HCC)    Chronic pain 03/09/2012   DEPRESSION 12/06/2007   Diabetes (HCC)    DIVERTICULOSIS, [place] 12/06/2007   GERD 12/06/2007   Hepatitis    age 51 hepatitis A   HLD (hyperlipidemia) 05/24/2019   HYPERTENSION 12/06/2007   Lumbar disc disease 03/09/2012   Mesenteric adenitis    MRSA 2006   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HCC) 11/08/2017   THORACIC/LUMBOSACRAL NEURITIS/RADICULITIS UNSPEC 12/28/2008   Past Surgical History:  Past Surgical History:  Procedure Laterality Date   ABDOMINAL HYSTERECTOMY  12/21/1997   1 ovary left   ANTERIOR CERVICAL DECOMP/DISCECTOMY FUSION N/A 09/07/2024   Procedure: ANTERIOR CERVICAL DECOMPRESSION/DISCECTOMY FUSION CERVICAL FOUR-FIVE LONEY REMOVAL CERVICAL FIVE-SEVEN PLATE;  Surgeon: Louis Shove, MD;  Location: [facility] OR;  Service: Neurosurgery;  Laterality: N/A;   bloo clot removed from neck   may 25th , june 2. 2010   CERVICAL DISC SURGERY  may 24th 2010   COLONOSCOPY WITH PROPOFOL  N/A 07/10/2016   Procedure: COLONOSCOPY WITH PROPOFOL ;  Surgeon: Elspeth Deward Naval, MD;  Location: [facility];  Service: Gastroenterology;  Laterality: N/A;   COLONOSCOPY WITH PROPOFOL  N/A 09/26/2019   Procedure: COLONOSCOPY WITH PROPOFOL ;  Surgeon: Naval Elspeth SQUIBB, MD;  Location: [facility];  Service: Gastroenterology;  Laterality: N/A;   ESOPHAGOGASTRODUODENOSCOPY (EGD) WITH PROPOFOL  N/A 07/10/2016   Procedure: ESOPHAGOGASTRODUODENOSCOPY (EGD) WITH PROPOFOL ;  Surgeon: Elspeth Deward Naval, MD;  Location: [facility];  Service: Gastroenterology;  Laterality: N/A;   POLYPECTOMY  09/26/2019   Procedure: POLYPECTOMY;  Surgeon: Naval Elspeth SQUIBB, MD;  Location: [facility];  Service: Gastroenterology;;   RADIOLOGY WITH ANESTHESIA N/A 07/30/2023   Procedure: MRI WITH ANESTHESIA - THORACIC LUMBAR WITHOUT CONSTRAST;  Surgeon: Radiologist, Medication, MD;  Location: [facility] OR;  Service: Radiology;  Laterality: N/A;   s/p ovary cyst     s/p right knee arthroscopy     Dr. Josefina ortho   HPI:  Patient is a 68 y.o. female who presented to the ED on 11/26/2024 with acute low back pain and left lower quadrant abdominal pain. She was found to have a UTI. She was recently admitted with back pain 11/8-11/10/25 and discharged to SNF, then discharged home three days prior to current admission. She had C4-5 ACDF in September which included removal of C5-7 hardware. PMH: obesity, depression, HTN, GERD, chronic low back pain, DM, heart failure with preserved EF.   Assessment / Plan / Recommendation Clinical Impression  Patient is not currently presenting with impairments in the areas of speech, language or cognition. She does exhibit a mildly hoarse sounding voice which she indicated has gotten better but not back to baseline since ACDF surgery. She also endorsed mild swallowing difficulty following ACDF which has completely resolved. Memory, reasoning, awareness, problem solving all appeared [facility]. SLP discussed option for patient to seek OP SLP for assessment and treatment as indicated of her voice and that she could discuss with her PCP and/or ENT. BSE deferred at this time as  patient with no current c/o difficulty swallowing.    SLP Assessment  SLP Recommendation/Assessment: Patient does not need any further Speech Language Pathology Services SLP Visit Diagnosis: Aphonia (R49.1);Cognitive communication deficit (R41.841)     Assistance Recommended at Discharge  None  Functional Status Assessment Patient has not had a recent decline in their functional status  Frequency and Duration           SLP Evaluation Cognition  Overall Cognitive  Status: Within Functional Limits for tasks assessed Arousal/Alertness: Awake/alert Orientation Level: Oriented X4       Comprehension  Auditory Comprehension Overall Auditory Comprehension: Appears within functional limits for tasks assessed    Expression Expression Primary Mode of Expression: Verbal Verbal Expression Overall Verbal Expression: Appears within functional limits for tasks assessed   Oral / Motor  Oral Motor/Sensory Function Overall Oral Motor/Sensory Function: Within functional limits Motor Speech Overall Motor Speech: Appears within functional limits for tasks assessed Respiration: Within functional limits Resonance: Within functional limits Articulation: Within functional limitis Intelligibility: Intelligible           Maria IVAR Blase, MA, CCC-SLP Speech Therapy  11/27/2024, 3:47 PM

## 2024-11-27 NOTE — TOC Initial Note (Signed)
 Transition of Care [facility]) - Initial/Assessment Note    Patient Details  Name: Maria [name] MRN: [id number] Date of Birth: May 19, 1956  Transition of [facility]) CM/SW Contact:    Doneta Glenys DASEN, RN Phone Number: 11/27/2024, 3:11 PM  Clinical Narrative:                 Patient recently discharged from [facility] (12/4) after a 2.5 week stay. Patient is agreeable to returning if she can have a private room. CM will start the SNF work up.   Expected Discharge Plan: Skilled Nursing Facility Barriers to Discharge: Continued Medical Work up, SNF Pending bed offer   Patient Goals and CMS Choice Patient states their goals for this hospitalization and ongoing recovery are:: Return to [employer].gov Compare Post Acute Care list provided to:: Patient Choice offered to / list presented to : Patient [place] ownership interest in [facility].provided to:: Patient    Expected Discharge Plan and Services In-house Referral: NA Discharge Planning Services: CM Consult   Living arrangements for the past 2 months: Apartment                 DME Arranged: N/A DME Agency: NA       HH Arranged: NA HH Agency: NA        Prior Living Arrangements/Services Living arrangements for the past 2 months: Apartment Lives with:: Self Patient language and need for interpreter reviewed:: Yes Do you feel safe going back to the place where you live?: Yes      Need for Family Participation in Patient Care: Yes (Comment) Care giver support system in place?: Yes (comment) Current home services:  (cane, rollator,BSC) Criminal Activity/Legal Involvement Pertinent to Current Situation/Hospitalization: No - Comment as needed  Activities of Daily Living   ADL Screening (condition at time of admission) Independently performs ADLs?: No Does the patient have a NEW difficulty with bathing/dressing/toileting/self-feeding that is expected to last >3 days?: Yes (Initiates electronic  notice to provider for possible OT consult) Does the patient have a NEW difficulty with getting in/out of bed, walking, or climbing stairs that is expected to last >3 days?: Yes (Initiates electronic notice to provider for possible PT consult) Does the patient have a NEW difficulty with communication that is expected to last >3 days?: Yes (Initiates electronic notice to provider for possible SLP consult) Is the patient deaf or have difficulty hearing?: No Does the patient have difficulty seeing, even when wearing glasses/contacts?: No Does the patient have difficulty concentrating, remembering, or making decisions?: No  Permission Sought/Granted Permission sought to share information with : Case Manager Permission granted to share information with : Yes, Verbal Permission Granted  Share Information with NAME: Rodriguez,[name] (Daughter)  [phone]  Permission granted to share info w AGENCY: [facility]        Emotional Assessment Appearance:: Appears stated age Attitude/Demeanor/Rapport: Gracious, Engaged Affect (typically observed): Appropriate Orientation: : Oriented to Self, Oriented to Place, Oriented to  Time, Oriented to Situation Alcohol  / Substance Use: Not Applicable Psych Involvement: No (comment)  Admission diagnosis:  UTI (urinary tract infection) [N39.0] Pyelonephritis [N12] Patient Active Problem List   Diagnosis Date Noted   Meningioma (HCC) 11/14/2024   UTI (urinary tract infection) 09/01/2024   Generalized weakness 01/31/2024   Hypoglycemia 01/31/2024   Anemia of chronic disease 01/31/2024   Pressure injury of skin 11/26/2023   Hallucinations 09/02/2023   Stasis dermatitis of both legs 08/30/2023   Hypomagnesemia 08/02/2023   Ankle  pain, right 08/01/2023   Weakness of both lower extremities 07/30/2023   Morbid obesity (HCC) 07/30/2023   (HFpEF) heart failure with preserved ejection fraction (HCC) 07/15/2023   Abdominal pain 07/15/2023   Acute renal  failure superimposed on stage 3a chronic kidney disease (HCC) 07/15/2023   Acute on chronic respiratory failure with hypoxia and hypercapnia (HCC) 02/08/2023   Pneumonia due to infectious organism 02/05/2023   Chronic right-sided congestive heart failure (HCC) 02/04/2023   Fall 02/02/2023   Physical deconditioning 07/22/2022   Fluid overload 07/21/2022   Acute cystitis    Dyspnea 12/19/2021   Leg swelling 11/20/2021   Bilateral knee pain 10/29/2021   Hypernatremia 09/24/2021   Cellulitis of lower extremity 09/24/2021   CKD (chronic kidney disease), stage IIIa (HCC) 09/24/2021   Sacral decubitus ulcer, stage II (HCC) 09/20/2021   Acute on chronic heart failure with preserved ejection fraction (HFpEF) (HCC) 09/19/2021   Nail disorder 07/07/2021   Pre-ulcerative corn or callous 07/07/2021   Rash 07/07/2021   Chronic diastolic HF (heart failure) (HCC) 03/17/2021   DM2 (diabetes mellitus, type 2) (HCC) 03/11/2021   Vitamin D  deficiency 01/01/2021   Medial epicondylitis 12/31/2020   Aortic atherosclerosis 09/26/2020   Umbilical hernia 09/26/2020   Constipation 08/05/2020   Arthritis 06/12/2020   Hoarseness 05/01/2020   B12 deficiency 01/31/2020   Rosacea 01/31/2020   Neck swelling 01/31/2020   History of colonic polyps    Benign neoplasm of [place]    Nausea 08/31/2019   Throat pain 07/26/2019   HLD (hyperlipidemia) 05/24/2019   Leg cramps 05/24/2019   Lump in neck 05/24/2019   Left thyroid  nodule 01/25/2019   Jerking movements of extremities 07/20/2018   Balance disorder 07/20/2018   Right knee pain 03/21/2018   OSA (obstructive sleep apnea) 03/21/2018   Oxygen desaturation 03/21/2018   Urinary symptom or sign 03/21/2018   Lymphedema 01/25/2018   Gait disorder 01/25/2018   Dysphonia 12/07/2017   Encounter for well adult exam with abnormal findings 11/08/2017   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   [place] polyp 08/11/2016   Abdominal pain, epigastric    Dysphagia     [place] cancer screening    ACE-inhibitor cough 12/05/2015   Restless legs syndrome 10/08/2015   Venous stasis dermatitis of both lower extremities 04/26/2015   GERD without esophagitis 10/17/2014   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/17/2014   AR (allergic rhinitis) 10/17/2014   Fatty liver 10/17/2014   Chronic pain syndrome 09/19/2014   Post-traumatic osteoarthritis of both knees 09/19/2014   Spinal stenosis 09/19/2014   Depression, major, single episode, complete remission 09/19/2014   Narcotic dependence (HCC) 09/19/2014   Acute hypoxic respiratory failure (HCC) 08/20/2014   False positive HIV serology 06/02/2013   Hypokalemia 06/29/2012   Spondylosis of 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 03/09/2012   Lumbar disc disease 03/09/2012   Morbid obesity with body mass index (BMI) of 50.0 to 59.9 in adult (HCC) 06/17/2011   Chronic low back pain 12/28/2008   GAD (generalized anxiety disorder) 12/06/2007   Depression 12/06/2007   Essential hypertension 12/06/2007   Gastroesophageal reflux disease without esophagitis 12/06/2007   LOW BACK PAIN 12/06/2007   Allergic rhinitis 08/10/2007   PCP:  Ileen [name], NP Pharmacy:   [facility] [place], [place] - [address] [place] [address] Phone: [phone] Fax: [phone]     Social Drivers of Health (SDOH) Social History: SDOH Screenings   Food Insecurity: No Food Insecurity (11/26/2024)  Housing: Low Risk  (11/26/2024)  Transportation Needs: No Transportation Needs (11/26/2024)  Utilities: Not At Risk (11/26/2024)  Alcohol  Screen: Low Risk  (12/30/2021)  Depression (PHQ2-9): Low Risk  (11/14/2024)  Financial Resource Strain: Medium Risk (12/30/2021)  Physical Activity: Inactive (12/30/2021)  Social Connections: Socially Isolated (11/26/2024)  Stress: No Stress Concern Present (12/30/2021)  Tobacco Use: Medium Risk (11/26/2024)   SDOH Interventions:      Readmission Risk Interventions    11/27/2024    3:08 PM 09/04/2024    4:45 PM 05/03/2024   10:25 AM  Readmission Risk Prevention Plan  Transportation Screening Complete Complete Complete  Medication Review Oceanographer) Complete Complete Complete  PCP or Specialist appointment within 3-5 days of discharge Complete Complete Complete  HRI or Home Care Consult Complete Complete Complete  SW Recovery Care/Counseling Consult Complete Complete Complete  Palliative Care Screening Not Applicable Not Applicable Not Applicable  Skilled Nursing Facility Complete Complete Not Applicable

## 2024-11-27 NOTE — Evaluation (Signed)
 Occupational Therapy Evaluation Patient Details Name: Maria [name] MRN: [id number] DOB: 1956-03-14 Today's Date: 11/27/2024   History of Present Illness   68 y.o. female who presents to the emergency department with acute low back pain and left lower quadrant abdominal pain found to have UTI. Recent admission 11/8-10/1024 with back pain, pt DCed to SNF, then was home for 3 days prior to this admission. Pt with medical history significant for morbid obesity, depression, hypertension, GERD, chronic low back pain, diabetes, heart failure with preserved EF     Clinical Impressions PTA, patient lives at home alone with supportive neighbor and had been modified independent with BADL's and mobility with rollator with hx of falls and then hospitalization/rehab then only able to remain home for ~ 3 days before return with above condition.   Currently, patient presents with deficits outlined below (see OT Problem List for details) most significantly pain, generalized muscle weakness, decreased balance, activity tolerance and edema limiting BADL's (max A LB) and functional mobility (+2 STS/sidestepping with RW) performance.   Patient will benefit from continued inpatient follow up therapy, <3 hours/day.  Patient requires continued Acute care hospital level OT services to progress safety and functional performance and allow for discharge.       If plan is discharge home, recommend the following:   Two people to help with walking and/or transfers;A lot of help with bathing/dressing/bathroom;Assistance with cooking/housework;Assist for transportation;Help with stairs or ramp for entrance     Functional Status Assessment   Patient has had a recent decline in their functional status and demonstrates the ability to make significant improvements in function in a reasonable and predictable amount of time.     Equipment Recommendations   None recommended by OT      Precautions/Restrictions    Precautions Precautions: Fall Recall of Precautions/Restrictions: Intact Restrictions Weight Bearing Restrictions Per Provider Order: No     Mobility Bed Mobility Overal bed mobility: Needs Assistance Bed Mobility: Rolling, Sidelying to Sit, Sit to Supine Rolling: Supervision Sidelying to sit: Supervision, Used rails   Sit to supine: Mod assist   General bed mobility comments: VCs for technique, increased time/effort; assist for BLEs into bed    Transfers Overall transfer level: Needs assistance Equipment used: Rolling walker (2 wheels) Transfers: Sit to/from Stand Sit to Stand: Mod assist, +2 safety/equipment, +2 physical assistance           General transfer comment: assist to power up, pt took 2 small side steps to the R then fatigued      Balance Overall balance assessment: Needs assistance Sitting-balance support: Feet supported, No upper extremity supported Sitting balance-Leahy Scale: Fair     Standing balance support: Bilateral upper extremity supported, During functional activity, Reliant on assistive device for balance Standing balance-Leahy Scale: Poor                             ADL either performed or assessed with clinical judgement   ADL Overall ADL's : Needs assistance/impaired Eating/Feeding: Set up   Grooming: Wash/dry hands;Wash/dry face;Oral care;Applying deodorant;Set up;Sitting   Upper Body Bathing: Minimal assistance;Sitting   Lower Body Bathing: Maximal assistance;Sitting/lateral leans   Upper Body Dressing : Minimal assistance;Sitting   Lower Body Dressing: Total assistance;Sitting/lateral leans   Toilet Transfer: +2 for physical assistance;+2 for safety/equipment;Maximal assistance;BSC/3in1   Toileting- Clothing Manipulation and Hygiene: Maximal assistance;Sitting/lateral lean Toileting - Clothing Manipulation Details (indicate cue type and reason): using Purewick for voiding  ease     Functional mobility during ADLs:  +2 for physical assistance;+2 for safety/equipment;Maximal assistance (RW STS or STEDY for transfer) General ADL Comments: pain and decreased LE functional reach     Vision Baseline Vision/History: 1 Wears glasses Ability to See in Adequate Light: 0 Adequate Patient Visual Report: No change from baseline;Blurring of vision (hx of R eye trauma years ago)              Pertinent Vitals/Pain Pain Assessment Pain Assessment: 0-10 Pain Score: 8  Pain Location: back, L knee Pain Descriptors / Indicators: Grimacing, Sore, Guarding Pain Intervention(s): Monitored during session, Limited activity within patient's tolerance, Premedicated before session, Repositioned, Ice applied     Extremity/Trunk Assessment Upper Extremity Assessment Upper Extremity Assessment: Generalized weakness;Right hand dominant;LUE deficits/detail LUE Deficits / Details: hx of L shoulder RTC pain but grossly WFL ROM, 3+/5 strength   Lower Extremity Assessment Lower Extremity Assessment: Defer to PT evaluation RLE Deficits / Details: knee ext +4/5, erythema noted BLEs (pt reports this is baseline since having cellulitis) RLE Sensation: WNL LLE Deficits / Details: knee ext -4/5, erythema noted BLEs (pt reports this is baseline since having cellulitis) LLE Sensation: WNL   Cervical / Trunk Assessment Cervical / Trunk Assessment: Normal;Other exceptions (increased body habitus)   Communication Communication Communication: No apparent difficulties   Cognition Arousal: Alert Behavior During Therapy: WFL for tasks assessed/performed Cognition: No apparent impairments                               Following commands: Intact       Cueing  General Comments   Cueing Techniques: Verbal cues  hip and side pain on L limited activity tolerance overall but good peffort put forth for EOB and STS light activity, B LE edema +2-3 with erythema noted           Home Living Family/patient expects to be  discharged to:: Private residence Living Arrangements: Alone Available Help at Discharge: Neighbor;Available PRN/intermittently Type of Home: Apartment Home Access: Level entry     Home Layout: One level     Bathroom Shower/Tub: Chief Strategy Officer: Handicapped height Bathroom Accessibility: No   Home Equipment: Hospital bed;Rollator (4 wheels);Wheelchair - manual;Tub bench;Adaptive equipment Adaptive Equipment: Reacher;Sock aid Additional Comments: neighbor helps with trash and cooking      Prior Functioning/Environment Prior Level of Function : History of Falls (last six months)             Mobility Comments: was at [facility] ST-SNF for 3.5 weeks, then was home 3 days prior to this readmission; at home was struggling to walk with rollator ADLs Comments: BSC over toilet. alternate sponge bathing and recently getting in shower since now has a tub bench. Orders groceries, prescriptions, insurance provides transport to MD when needed    OT Problem List: Decreased strength;Decreased activity tolerance;Impaired balance (sitting and/or standing);Cardiopulmonary status limiting activity;Obesity;Impaired UE functional use;Pain;Increased edema   OT Treatment/Interventions: Self-care/ADL training;Therapeutic exercise;Neuromuscular education;Energy conservation;DME and/or AE instruction;Manual therapy;Therapeutic activities;Patient/family education;Balance training      OT Goals(Current goals can be found in the care plan section)   Acute Rehab OT Goals Patient Stated Goal: to get back to rehab and get stronger OT Goal Formulation: With patient Time For Goal Achievement: 12/11/24 Potential to Achieve Goals: Good ADL Goals Pt Will Perform Lower Body Bathing: with mod assist;with adaptive equipment;sitting/lateral leans Pt Will Perform Lower Body Dressing:  with mod assist;sitting/lateral leans Pt Will Transfer to Toilet: with +2 assist;bedside commode;with mod  assist Pt Will Perform Toileting - Clothing Manipulation and hygiene: with mod assist;sitting/lateral leans Pt/caregiver will Perform Home Exercise Program: Increased strength;Both right and left upper extremity;With Supervision;With written HEP provided Additional ADL Goal #1: Patient will teach back 4/5 ECT strategies including positioning for back pain with min cues   OT Frequency:  Min 2X/week    Co-evaluation   Reason for Co-Treatment: Complexity of the patient's impairments (multi-system involvement);To address functional/ADL transfers PT goals addressed during session: Mobility/safety with mobility        AM-PAC OT 6 Clicks Daily Activity     Outcome Measure Help from another person eating meals?: None Help from another person taking care of personal grooming?: A Little Help from another person toileting, which includes using toliet, bedpan, or urinal?: A Lot Help from another person bathing (including washing, rinsing, drying)?: A Lot Help from another person to put on and taking off regular upper body clothing?: A Little Help from another person to put on and taking off regular lower body clothing?: A Lot 6 Click Score: 16   End of Session Equipment Utilized During Treatment: Gait belt;Rolling walker (2 wheels) Nurse Communication: Mobility status  Activity Tolerance: Patient limited by pain Patient left: in bed;with call bell/phone within reach;with bed alarm set  OT Visit Diagnosis: Unsteadiness on feet (R26.81);Other abnormalities of gait and mobility (R26.89);Muscle weakness (generalized) (M62.81);Pain Pain - Right/Left: Left Pain - part of body: Shoulder;Hip                Time: 1135-1205 OT Time Calculation (min): 30 min Charges:  OT General Charges $OT Visit: 1 Visit OT Evaluation $OT Eval Low Complexity: 1 Low OT Treatments $Self Care/Home Management : 8-22 mins  Blaike Newburn OT/L Acute Rehabilitation Department  [phone]  11/27/2024, 12:52 PM

## 2024-11-27 NOTE — Progress Notes (Signed)
 PROGRESS NOTE    Maria [name]  FMW:[id number] DOB: 1956-05-25 DOA: 11/26/2024 PCP: Ileen [name], NP    Chief Complaint  Patient presents with   Back Pain    Brief Narrative:  Patient 68 year old female history of morbid obesity, depression, hypertension, GERD, chronic low back pain, diabetes, HFpEF who presented to the ED with acute low back pain and left lower quadrant abdominal pain with workup concerning for UTI.  Patient placed empirically on IV antibiotics.   Assessment & Plan:   Principal Problem:   UTI (urinary tract infection) Active Problems:   Chronic diastolic HF (heart failure) (HCC)   Chronic low back pain   Morbid obesity with body mass index (BMI) of 50.0 to 59.9 in adult [facility])   GERD without esophagitis   Constipation   DM2 (diabetes mellitus, type 2) (HCC)   Acute cystitis   (HFpEF) heart failure with preserved ejection fraction (HCC)  #1 UTI -Patient on presentation with complaints of left lower quadrant abdominal pain, acute low back pain with increased frequency of urination and felt like she had a urinary tract infection. - Patient noted to have a mild leukocytosis. - Patient noted with history of Klebsiella UTI. - Patient with clinical improvement. - Urine cultures with multiple species recommending recollection. - Patient already on IV Rocephin  which we will continue to treat empirically for 3 to 5 days. - Supportive care.  2.  Abdominal pain/constipation -Abdominal pain felt likely secondary to problem #1. - Patient noted to have significant tenderness to palpation left lower quadrant per admitting physician. - CT abdomen and pelvis done with no acute intra-abdominal process.  Moderate amount of retained stool in the [place] suggesting constipation.  Aortic atherosclerosis and coronary artery calcifications. - Will place patient on bowel regimen of MiraLAX  twice daily, Senokot-S twice daily.  Sorbitol  p.o. x 1. - Supportive care.  3.   Chronic HFpEF -Patient noted to be euvolemic on examination with no significant volume overload noted. - Resume home regimen of Toprol -XL, torsemide , Aldactone .  4.  Chronic low back pain -Patient with known spinal disease. - CT with severe central canal stenosis with broad-based disc protrusion. - PT/OT. - Patient states was on 10 mg of oxycodone  as needed prior to admission and as such we will increase current dose of 5 mg oxycodone  back to home regimen of 10 mg oxycodone  as needed. - May benefit from outpatient orthopedic/spine evaluation.  5.  Diabetes mellitus type 2 -Hemoglobin A1c 5.0 (09/01/2024) - Patient noted to be on Mounjaro prior to admission. - CBG noted at 81 this morning. - SSI.  6.  GERD -PPI.  7.  Morbid obesity -Lifestyle modification - Outpatient follow-up with PCP.    DVT prophylaxis: Lovenox  Code Status: Full Family Communication: Updated patient.  No family at bedside. Disposition: SNF when clinically stable hopefully in the next 24 to 48 hours.  Status is: Inpatient Remains inpatient appropriate because: Severity of illness   Consultants:  None  Procedures:  CT abdomen and pelvis 11/26/2024   Antimicrobials:  Anti-infectives (From admission, onward)    Start     Dose/Rate Route Frequency Ordered Stop   11/27/24 1000  cefTRIAXone  (ROCEPHIN ) 2 g in sodium chloride  0.9 % 100 mL IVPB        2 g 200 mL/hr over 30 Minutes Intravenous Daily 11/26/24 1329     11/26/24 1245  cefTRIAXone  (ROCEPHIN ) 2 g in sodium chloride  0.9 % 100 mL IVPB        2  g 200 mL/hr over 30 Minutes Intravenous  Once 11/26/24 1235 11/26/24 1340   11/26/24 1245  metroNIDAZOLE  (FLAGYL ) IVPB 500 mg        500 mg 100 mL/hr over 60 Minutes Intravenous  Once 11/26/24 1235 11/26/24 1513         Subjective: Patient laying in bed eating lunch.  Denies any chest pain.  No significant shortness of breath.  Feels suprapubic/lower abdominal pain improved since admission.  Stated  had large bowel movement 2 days prior to admission.  Patient complaining of difficulty ambulating after she was sent home from SNF after it was stated to her that her appeal got denied which was a mistake because she was called back that her her appeal was approved.  Objective: Vitals:   11/26/24 1842 11/26/24 2132 11/27/24 0430 11/27/24 1441  BP:  127/61 (!) 142/62 (!) 159/51  Pulse:  77 83 80  Resp:  18 16   Temp:  98 F (36.7 C) 97.7 F (36.5 C) 98.1 F (36.7 C)  TempSrc:      SpO2:  99% 98% 100%  Weight: (!) 149 kg     Height: 5' 3 (1.6 m)       Intake/Output Summary (Last 24 hours) at 11/27/2024 1838 Last data filed at 11/27/2024 1836 Gross per 24 hour  Intake 678.93 ml  Output 3500 ml  Net -2821.07 ml   Filed Weights   11/26/24 1842  Weight: (!) 149 kg    Examination:  General exam: NAD Respiratory system: CTAB.  No wheezes, no crackles, no rhonchi.  Fair air movement.  Speaking in full sentences.  No use of accessory muscles of respiration.   Cardiovascular system: S1 & S2 heard, RRR. No JVD, murmurs, rubs, gallops or clicks. No pedal edema. Gastrointestinal system: Abdomen is nondistended, soft and decreased tenderness to palpation in the suprapubic region.  Positive bowel sounds.  No rebound.  No guarding.  No CVA TTP Central nervous system: Alert and oriented. No focal neurological deficits. Extremities: Symmetric 5 x 5 power. Skin: Chronic venous stasis changes noted in bilateral lower extremities.  Psychiatry: Judgement and insight appear normal. Mood & affect appropriate.     Data Reviewed: I have personally reviewed following labs and imaging studies  CBC: Recent Labs  Lab 11/26/24 1134 11/27/24 0414  WBC 11.7* 6.4  NEUTROABS 8.1*  --   HGB 11.4* 11.1*  HCT 35.2* 35.0*  MCV 91.9 93.8  PLT 246 211    Basic Metabolic Panel: Recent Labs  Lab 11/26/24 1049 11/27/24 0414  NA 138 142  K 4.3 4.0  CL 102 106  CO2 27 27  GLUCOSE 85 78  BUN 13 10   CREATININE 1.03* 0.91  CALCIUM  9.6 8.8*    GFR: Estimated Creatinine Clearance: 86.2 mL/min (by C-G formula based on SCr of 0.91 mg/dL).  Liver Function Tests: No results for input(s): AST, ALT, ALKPHOS, BILITOT, PROT, ALBUMIN  in the last 168 hours.  CBG: Recent Labs  Lab 11/26/24 1627 11/26/24 2132 11/27/24 0801 11/27/24 1216 11/27/24 1715  GLUCAP 54* 71 81 73 98     Recent Results (from the past 240 hours)  Urine Culture (for pregnant, neutropenic or urologic patients or patients with an indwelling urinary catheter)     Status: Abnormal   Collection Time: 11/26/24  3:13 PM   Specimen: Urine, Clean Catch  Result Value Ref Range Status   Specimen Description   Final    URINE, CLEAN CATCH Performed at [facility], [address] [place], [place] [address]    Special Requests   Final    NONE Performed at [facility], [address], [place], [place] [address]    Culture MULTIPLE SPECIES PRESENT, SUGGEST RECOLLECTION (A)  Final   Report Status 11/27/2024 FINAL  Final         Radiology Studies: CT ABDOMEN PELVIS W CONTRAST Result Date: 11/26/2024 CLINICAL DATA:  Abdominal pain, acute nonlocalized. Flank pain, stone suspected. Chronic low back pain. EXAM: CT ABDOMEN AND PELVIS WITH CONTRAST TECHNIQUE: Multidetector CT imaging of the abdomen and pelvis was performed using the standard protocol following bolus administration of intravenous contrast. RADIATION DOSE REDUCTION: This exam was performed according to the departmental dose-optimization program which includes automated exposure control, adjustment of the mA and/or kV according to patient size and/or use of iterative reconstruction technique. CONTRAST:  OMNIPAQUE  IOHEXOL  300 MG/ML  SOLN COMPARISON:  04/28/2024. FINDINGS: Lower chest: The heart is enlarged and coronary artery calcifications are noted. Atelectasis is present at the lung bases. Hepatobiliary: No  focal liver abnormality is seen. No gallstones, gallbladder wall thickening, or biliary dilatation. Pancreas: Unremarkable. No pancreatic ductal dilatation or surrounding inflammatory changes. Spleen: Normal in size without focal abnormality. Adrenals/Urinary Tract: The adrenal glands are within normal limits. The kidneys enhance symmetrically. No renal calculus or obstructive uropathy is seen bilaterally. The bladder is unremarkable. Stomach/Bowel: The stomach is within normal limits. No bowel obstruction, free air, or pneumatosis is seen. Appendix appears normal. A moderate amount of retained stool is present in the [place]. Vascular/Lymphatic: Aortic atherosclerosis. There is a stable nonspecific enlarged lymph node at the porta hepatis measuring 1.1 cm. Remaining abdominopelvic lymph nodes are normal by size criteria. Reproductive: Status post hysterectomy. No adnexal masses. Other: No abdominopelvic ascites. A fat containing umbilical hernia is noted. Musculoskeletal: Degenerative changes are present in the thoracolumbar spine. No acute osseous abnormality is seen. IMPRESSION: 1. No acute intra-abdominal process. 2. Moderate amount of retained stool in the [place] suggesting constipation. 3. Aortic atherosclerosis and coronary artery calcifications. Electronically Signed   By: Leita Birmingham M.D.   On: 11/26/2024 14:28   CT Lumbar Spine Wo Contrast Result Date: 11/26/2024 EXAM: CT OF THE LUMBAR SPINE WITHOUT CONTRAST 11/26/2024 11:10:09 AM TECHNIQUE: CT of the lumbar spine was performed without the administration of intravenous contrast. Multiplanar reformatted images are provided for review. Automated exposure control, iterative reconstruction, and/or weight based adjustment of the mA/kV was utilized to reduce the radiation dose to as low as reasonably achievable. COMPARISON: MR lumbar spine without contrast 09/02/2024 and CT lumbar spine without contrast 1925. CLINICAL HISTORY: acute low back pain FINDINGS:  BONES AND ALIGNMENT: Normal vertebral body heights. Transitional L5 segment is noted. Grade 1 degenerative anterolisthesis is present at L3-L4 and L4-L5. No acute fracture or suspicious bone lesion. Normal alignment. DEGENERATIVE CHANGES: * **L1-L2:** Moderate facet hypertrophy is present without focal disc protrusion or stenosis. * **L2-L3:** A far left lateral disc protrusion is again noted and stable moderate left greater than right foraminal stenosis. * **L3-L4:** Uncovertebral broad-based disc protrusion and advanced facet hypertrophy results in severe right central canal stenosis. Moderate-to-severe right and moderate left foraminal stenosis is stable. * **L4-L5:** A broad-based disc protrusion and advanced facet hypertrophy is present with moderate central canal stenosis. Left greater than right subarticular urine is present. Moderate foraminal stenosis is worse on the left. * **L5-S1:** Facet spurring is present without focal disc protrusion or stenosis. SOFT TISSUES: Atherosclerotic calcifications are present  in the aorta without aneurysm. No acute abnormality. IMPRESSION: 1. 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 Stable moderate-to-severe right and moderate left foraminal stenosis. 2. Moderate central canal stenosis at L4-5 due to broad-based disc protrusion and advanced facet hypertrophy. Left greater than right subarticular recess narrowing is present. Moderate foraminal stenosis is worse on the left. 3. Stable far left lateral disc protrusion at L2-3 with moderate left greater than right foraminal stenosis. Electronically signed by: Lonni Necessary MD 11/26/2024 11:28 AM EST RP Workstation: [id number]        Scheduled Meds:  cholecalciferol   1,000 Units Oral Daily   cyanocobalamin   1,000 mcg Oral Daily   enoxaparin  (LOVENOX ) injection  60 mg Subcutaneous Q24H   famotidine   40 mg Oral QHS   feeding supplement  237 mL  Oral BID BM   fluticasone   1 spray Each Nare Daily   insulin  aspart  0-15 Units Subcutaneous TID WC   insulin  aspart  0-5 Units Subcutaneous QHS   ketorolac   15 mg Intravenous Once   lidocaine   1 patch Transdermal Q24H   metolazone   2.5 mg Oral Weekly   metoprolol  succinate  25 mg Oral Daily   pantoprazole   40 mg Oral Daily   polyethylene glycol  17 g Oral BID   potassium chloride  SA  40 mEq Oral Daily   pramipexole   0.5 mg Oral TID   senna-docusate  1 tablet Oral BID   sorbitol   30 mL Oral Once   spironolactone   25 mg Oral Daily   torsemide   40 mg Oral BID   Continuous Infusions:  cefTRIAXone  (ROCEPHIN )  IV Stopped (11/27/24 1158)     LOS: 1 day    Time spent: 40 minutes    Toribio Hummer, MD [facility]   To contact the attending provider between 7A-7P or the covering provider during after hours 7P-7A, please log into the web site [email or web address] and access using universal [place] password for that web site. If you do not have the password, please call the hospital operator.  11/27/2024, 6:38 PM

## 2024-11-27 NOTE — [place] FL2 (Signed)
 [place]  MEDICAID FL2 LEVEL OF CARE FORM     IDENTIFICATION  Patient Name: Maria [name] Birthdate: 1955-12-30 Sex: female Admission Date (Current Location): 11/26/2024  [facility] and [place] Number:  Producer, Television/film/video and Address:  [facility],  [address], [place] [address]      Provider Number: [id number]  Attending Physician Name and Address:  Sebastian Toribio GAILS, MD  Relative Name and Phone Number:       Current Level of Care: Hospital Recommended Level of Care: Skilled Nursing Facility Prior Approval Number:    Date Approved/Denied:   PASRR Number: [id number]  Discharge Plan: SNF    Current Diagnoses: Patient Active Problem List   Diagnosis Date Noted   Meningioma (HCC) 11/14/2024   UTI (urinary tract infection) 09/01/2024   Generalized weakness 01/31/2024   Hypoglycemia 01/31/2024   Anemia of chronic disease 01/31/2024   Pressure injury of skin 11/26/2023   Hallucinations 09/02/2023   Stasis dermatitis of both legs 08/30/2023   Hypomagnesemia 08/02/2023   Ankle pain, right 08/01/2023   Weakness of both lower extremities 07/30/2023   Morbid obesity (HCC) 07/30/2023   (HFpEF) heart failure with preserved ejection fraction (HCC) 07/15/2023   Abdominal pain 07/15/2023   Acute renal failure superimposed on stage 3a chronic kidney disease (HCC) 07/15/2023   Acute on chronic respiratory failure with hypoxia and hypercapnia (HCC) 02/08/2023   Pneumonia due to infectious organism 02/05/2023   Chronic right-sided congestive heart failure (HCC) 02/04/2023   Fall 02/02/2023   Physical deconditioning 07/22/2022   Fluid overload 07/21/2022   Acute cystitis    Dyspnea 12/19/2021   Leg swelling 11/20/2021   Bilateral knee pain 10/29/2021   Hypernatremia 09/24/2021   Cellulitis of lower extremity 09/24/2021   CKD (chronic kidney disease), stage IIIa (HCC) 09/24/2021   Sacral decubitus ulcer, stage II (HCC) 09/20/2021   Acute on chronic heart  failure with preserved ejection fraction (HFpEF) (HCC) 09/19/2021   Nail disorder 07/07/2021   Pre-ulcerative corn or callous 07/07/2021   Rash 07/07/2021   Chronic diastolic HF (heart failure) (HCC) 03/17/2021   DM2 (diabetes mellitus, type 2) (HCC) 03/11/2021   Vitamin D  deficiency 01/01/2021   Medial epicondylitis 12/31/2020   Aortic atherosclerosis 09/26/2020   Umbilical hernia 09/26/2020   Constipation 08/05/2020   Arthritis 06/12/2020   Hoarseness 05/01/2020   B12 deficiency 01/31/2020   Rosacea 01/31/2020   Neck swelling 01/31/2020   History of colonic polyps    Benign neoplasm of [place]    Nausea 08/31/2019   Throat pain 07/26/2019   HLD (hyperlipidemia) 05/24/2019   Leg cramps 05/24/2019   Lump in neck 05/24/2019   Left thyroid  nodule 01/25/2019   Jerking movements of extremities 07/20/2018   Balance disorder 07/20/2018   Right knee pain 03/21/2018   OSA (obstructive sleep apnea) 03/21/2018   Oxygen desaturation 03/21/2018   Urinary symptom or sign 03/21/2018   Lymphedema 01/25/2018   Gait disorder 01/25/2018   Dysphonia 12/07/2017   Encounter for well adult exam with abnormal findings 11/08/2017   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   [place] polyp 08/11/2016   Abdominal pain, epigastric    Dysphagia    [place] cancer screening    ACE-inhibitor cough 12/05/2015   Restless legs syndrome 10/08/2015   Venous stasis dermatitis of both lower extremities 04/26/2015   GERD without esophagitis 10/17/2014   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/17/2014   AR (allergic rhinitis) 10/17/2014   Fatty liver 10/17/2014   Chronic pain syndrome 09/19/2014  Post-traumatic osteoarthritis of both knees 09/19/2014   Spinal stenosis 09/19/2014   Depression, major, single episode, complete remission 09/19/2014   Narcotic dependence (HCC) 09/19/2014   Acute hypoxic respiratory failure (HCC) 08/20/2014   False positive HIV serology 06/02/2013   Hypokalemia 06/29/2012    Spondylosis of 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 03/09/2012   Lumbar disc disease 03/09/2012   Morbid obesity with body mass index (BMI) of 50.0 to 59.9 in adult [facility]) 06/17/2011   Chronic low back pain 12/28/2008   GAD (generalized anxiety disorder) 12/06/2007   Depression 12/06/2007   Essential hypertension 12/06/2007   Gastroesophageal reflux disease without esophagitis 12/06/2007   LOW BACK PAIN 12/06/2007   Allergic rhinitis 08/10/2007    Orientation RESPIRATION BLADDER Height & Weight     Self, Time, Situation, Place  Normal Incontinent, External catheter Weight: (!) 149 kg Height:  5' 3 (160 cm)  BEHAVIORAL SYMPTOMS/MOOD NEUROLOGICAL BOWEL NUTRITION STATUS      Continent Diet  AMBULATORY STATUS COMMUNICATION OF NEEDS Skin   Extensive Assist Verbally Normal                       Personal Care Assistance Level of Assistance  Bathing, Feeding, Dressing Bathing Assistance: Limited assistance Feeding assistance: Independent Dressing Assistance: Limited assistance     Functional Limitations Info  Sight, Hearing, Speech Sight Info: Impaired Hearing Info: Adequate Speech Info: Adequate    SPECIAL CARE FACTORS FREQUENCY  PT (By licensed PT), OT (By licensed OT)     PT Frequency: 5x weekly OT Frequency: 5x weekly            Contractures Contractures Info: Not present    Additional Factors Info  Code Status, Allergies, Psychotropic Code Status Info: FULL Allergies Info: Amoxicillin -pot Clavulanate, Adhesive (Tape), Codeine, Crestor  (Rosuvastatin  Calcium ), Cyclobenzaprine , Cymbalta  (Duloxetine  Hcl), Gabapentin , Lexapro (Escitalopram), Lyrica (Pregabalin), Morphine  And Codeine, Vicodin (Hydrocodone-acetaminophen ), Latex Psychotropic Info: Clonazepam          Current Medications (11/27/2024):  This is the current hospital active medication list Current Facility-Administered Medications  Medication Dose Route Frequency Provider Last Rate Last  Admin   acetaminophen  (TYLENOL ) tablet 650 mg  650 mg Oral Q6H PRN Zella, Mir M, MD       Or   acetaminophen  (TYLENOL ) suppository 650 mg  650 mg Rectal Q6H PRN Zella, Mir M, MD       albuterol  (PROVENTIL ) (2.5 MG/3ML) 0.083% nebulizer solution 2.5 mg  2.5 mg Nebulization Q2H PRN Zella, Mir M, MD       cefTRIAXone  (ROCEPHIN ) 2 g in sodium chloride  0.9 % 100 mL IVPB  2 g Intravenous Daily Zella, Mir M, MD 200 mL/hr at 11/27/24 1128 2 g at 11/27/24 1128   cholecalciferol  (VITAMIN D3) 25 MCG (1000 UNIT) tablet 1,000 Units  1,000 Units Oral Daily Sebastian Toribio GAILS, MD       cyanocobalamin  (VITAMIN B12) tablet 1,000 mcg  1,000 mcg Oral Daily Sebastian Toribio GAILS, MD       enoxaparin  (LOVENOX ) injection 60 mg  60 mg Subcutaneous Q24H Mark Bard LABOR, RPH   60 mg at 11/26/24 2222   famotidine  (PEPCID ) tablet 40 mg  40 mg Oral QHS Sebastian Toribio GAILS, MD       feeding supplement (ENSURE PLUS HIGH PROTEIN) liquid 237 mL  237 mL Oral BID BM Sebastian Toribio GAILS, MD       fentaNYL  (SUBLIMAZE ) injection 12.5-50 mcg  12.5-50 mcg Intravenous Q2H PRN Zella Katha HERO, MD  12.5 mcg at 11/27/24 1122   fluticasone  (FLONASE ) 50 MCG/ACT nasal spray 1 spray  1 spray Each Nare Daily Sebastian Toribio GAILS, MD       insulin  aspart (novoLOG ) injection 0-15 Units  0-15 Units Subcutaneous TID WC Zella, Mir M, MD       insulin  aspart (novoLOG ) injection 0-5 Units  0-5 Units Subcutaneous QHS Zella, Mir M, MD       ketorolac  (TORADOL ) 15 MG/ML injection 15 mg  15 mg Intravenous Once Dufour, Gabrielle S, PA-C       ketorolac  (TORADOL ) 30 MG/ML injection 30 mg  30 mg Intravenous Q6H PRN Sebastian Toribio GAILS, MD       lidocaine  (LIDODERM ) 5 % 1 patch  1 patch Transdermal Q24H Sebastian Toribio GAILS, MD   1 patch at 11/27/24 1044   methocarbamol  (ROBAXIN ) tablet 500 mg  500 mg Oral Q6H PRN Sebastian Toribio GAILS, MD       metolazone  (ZAROXOLYN ) tablet 2.5 mg  2.5 mg Oral Weekly Sebastian Toribio GAILS, MD        metoprolol  succinate (TOPROL -XL) 24 hr tablet 25 mg  25 mg Oral Daily Sebastian Toribio GAILS, MD       ondansetron  (ZOFRAN ) tablet 4 mg  4 mg Oral Q6H PRN Zella, Mir M, MD       Or   ondansetron  (ZOFRAN ) injection 4 mg  4 mg Intravenous Q6H PRN Zella Katha HERO, MD       Oral care mouth rinse  15 mL Mouth Rinse PRN Zella, Mir M, MD       oxyCODONE  (Oxy IR/ROXICODONE ) immediate release tablet 10 mg  10 mg Oral Q4H PRN Sebastian Toribio GAILS, MD       pantoprazole  (PROTONIX ) EC tablet 40 mg  40 mg Oral Daily Sebastian Toribio GAILS, MD   40 mg at 11/27/24 1045   polyethylene glycol (MIRALAX  / GLYCOLAX ) packet 17 g  17 g Oral BID Sebastian Toribio GAILS, MD       potassium chloride  SA (KLOR-CON  M) CR tablet 40 mEq  40 mEq Oral Daily Sebastian Toribio GAILS, MD   40 mEq at 11/27/24 1045   pramipexole  (MIRAPEX ) tablet 0.5 mg  0.5 mg Oral TID Sebastian Toribio GAILS, MD   0.5 mg at 11/27/24 1045   senna-docusate (Senokot-S) tablet 1 tablet  1 tablet Oral BID Sebastian Toribio GAILS, MD       spironolactone  (ALDACTONE ) tablet 25 mg  25 mg Oral Daily Sebastian Toribio GAILS, MD   25 mg at 11/27/24 1045   torsemide  (DEMADEX ) tablet 40 mg  40 mg Oral BID Sebastian Toribio GAILS, MD       traZODone  (DESYREL ) tablet 25 mg  25 mg Oral QHS PRN Zella Katha HERO, MD   25 mg at 11/26/24 2333     Discharge Medications: Please see discharge summary for a list of discharge medications.  Relevant Imaging Results:  Relevant Lab Results:   Additional Information SSN [id number]  Doneta Glenys DASEN, RN

## 2024-11-28 LAB — BASIC METABOLIC PANEL WITH GFR
Anion gap: 8 (ref 5–15)
BUN: 9 mg/dL (ref 8–23)
CO2: 30 mmol/L (ref 22–32)
Calcium: 8.8 mg/dL — ABNORMAL LOW (ref 8.9–10.3)
Chloride: 105 mmol/L (ref 98–111)
Creatinine, Ser: 0.87 mg/dL (ref 0.44–1.00)
GFR, Estimated: >60 mL/min (ref >60)
Glucose, Bld: 95 mg/dL (ref 70–99)
Potassium: 4.4 mmol/L (ref 3.5–5.1)
Sodium: 142 mmol/L (ref 135–145)

## 2024-11-28 LAB — CBC
HCT: 34.1 % — ABNORMAL LOW (ref 36.0–46.0)
Hemoglobin: 10.9 g/dL — ABNORMAL LOW (ref 12.0–15.0)
MCH: 29.7 pg (ref 26.0–34.0)
MCHC: 32 g/dL (ref 30.0–36.0)
MCV: 92.9 fL (ref 80.0–100.0)
Platelets: 227 K/uL (ref 150–400)
RBC: 3.67 MIL/uL — ABNORMAL LOW (ref 3.87–5.11)
RDW: 13.8 % (ref 11.5–15.5)
WBC: 6.1 K/uL (ref 4.0–10.5)
nRBC: 0 % (ref 0.0–0.2)

## 2024-11-28 LAB — GLUCOSE, CAPILLARY
Glucose-Capillary: 101 mg/dL — ABNORMAL HIGH (ref 70–99)
Glucose-Capillary: 123 mg/dL — ABNORMAL HIGH (ref 70–99)
Glucose-Capillary: 92 mg/dL (ref 70–99)
Glucose-Capillary: 102 mg/dL — ABNORMAL HIGH (ref 70–99)

## 2024-11-28 MED ORDER — CEFADROXIL 500 MG PO CAPS
1000.0000 mg | ORAL_CAPSULE | Freq: Two times a day (BID) | ORAL | Status: DC
  Administered 2024-11-29 – 2024-11-30 (×3): 1000 mg via ORAL
  Filled 2024-11-28 (×3): qty 2

## 2024-11-28 MED ORDER — SENNOSIDES-DOCUSATE SODIUM 8.6-50 MG PO TABS
2.0000 | ORAL_TABLET | Freq: Two times a day (BID) | ORAL | Status: DC
  Administered 2024-11-28 – 2024-11-30 (×4): 2 via ORAL
  Filled 2024-11-28 (×4): qty 2

## 2024-11-28 MED ORDER — CLONAZEPAM 1 MG PO TABS
1.0000 mg | ORAL_TABLET | Freq: Two times a day (BID) | ORAL | Status: DC | PRN
  Administered 2024-11-28: 1 mg via ORAL
  Filled 2024-11-28: qty 1

## 2024-11-28 MED ORDER — DICLOFENAC SODIUM 1 % EX GEL
2.0000 g | Freq: Four times a day (QID) | CUTANEOUS | Status: DC
  Administered 2024-11-28 – 2024-11-30 (×8): 2 g via TOPICAL
  Filled 2024-11-28: qty 100

## 2024-11-28 MED ORDER — SORBITOL 70 % SOLN
30.0000 mL | Status: AC
  Filled 2024-11-28 (×2): qty 30

## 2024-11-28 NOTE — Progress Notes (Signed)
 PROGRESS NOTE    Maria [name]  FMW:[id number] DOB: 24-Nov-1956 DOA: 11/26/2024 PCP: Ileen [name], NP    Chief Complaint  Patient presents with   Back Pain    Brief Narrative:  Patient 68 year old female history of morbid obesity, depression, hypertension, GERD, chronic low back pain, diabetes, HFpEF who presented to the ED with acute low back pain and left lower quadrant abdominal pain with workup concerning for UTI.  Patient placed empirically on IV antibiotics.   Assessment & Plan:   Principal Problem:   UTI (urinary tract infection) Active Problems:   Chronic diastolic HF (heart failure) (HCC)   Chronic low back pain   Morbid obesity with body mass index (BMI) of 50.0 to 59.9 in adult [facility])   GERD without esophagitis   Constipation   DM2 (diabetes mellitus, type 2) (HCC)   Acute cystitis   (HFpEF) heart failure with preserved ejection fraction (HCC)  #1 UTI -Patient on presentation with complaints of left lower quadrant abdominal pain, acute low back pain with increased frequency of urination and felt like she had a urinary tract infection. - Patient noted to have a mild leukocytosis. - Patient noted with history of Klebsiella UTI. - Patient with clinical improvement. - Urine cultures with multiple species recommending recollection. - Patient already on IV Rocephin  and will transition to oral Duricef tomorrow to complete a 5-day course of antibiotic treatment.   - Supportive care.    2.  Abdominal pain/constipation -Abdominal pain felt likely secondary to problem #1. - Patient noted to have significant tenderness to palpation left lower quadrant per admitting physician. - CT abdomen and pelvis done with no acute intra-abdominal process.  Moderate amount of retained stool in the [place] suggesting constipation.  Aortic atherosclerosis and coronary artery calcifications. - Continue bowel regimen of MiraLAX  twice daily, Senokot-S twice daily.   - Sorbitol  p.o.   -  Supportive care.    3.  Chronic HFpEF -Patient noted to be euvolemic on examination with no significant volume overload noted. - Continue home regimen Toprol -XL, torsemide , Aldactone .   4.  Chronic low back pain -Patient with known spinal disease. - CT with severe central canal stenosis with broad-based disc protrusion. - PT/OT. - Patient states was on 10 mg of oxycodone  as needed prior to admission and has been resumed on home regimen of oxycodone  as needed. - May benefit from outpatient orthopedic/spine evaluation.  5.  Diabetes mellitus type 2 -Hemoglobin A1c 5.0 (09/01/2024) - Patient noted to be on Mounjaro prior to admission. - CBG noted at 101 this morning. - SSI.  6.  GERD - Continue PPI.  7.  Morbid obesity -Lifestyle modification - Outpatient follow-up with PCP.    DVT prophylaxis: Lovenox  Code Status: Full Family Communication: Updated patient.  No family at bedside. Disposition: SNF when clinically stable hopefully in the next 24 to 48 hours.  Status is: Inpatient Remains inpatient appropriate because: Severity of illness   Consultants:  None  Procedures:  CT abdomen and pelvis 11/26/2024   Antimicrobials:  Anti-infectives (From admission, onward)    Start     Dose/Rate Route Frequency Ordered Stop   11/27/24 1000  cefTRIAXone  (ROCEPHIN ) 2 g in sodium chloride  0.9 % 100 mL IVPB        2 g 200 mL/hr over 30 Minutes Intravenous Daily 11/26/24 1329     11/26/24 1245  cefTRIAXone  (ROCEPHIN ) 2 g in sodium chloride  0.9 % 100 mL IVPB        2 g  200 mL/hr over 30 Minutes Intravenous  Once 11/26/24 1235 11/26/24 1340   11/26/24 1245  metroNIDAZOLE  (FLAGYL ) IVPB 500 mg        500 mg 100 mL/hr over 60 Minutes Intravenous  Once 11/26/24 1235 11/26/24 1513         Subjective: Patient sitting up in recliner.  States abdominal pain has improved since admission.  Denies any chest pain or shortness of breath.  Patient with complaints of left knee pain moving up  to her left hip.  Patient states she has osteoarthritis of the left knee.   Objective: Vitals:   11/27/24 1441 11/27/24 1940 11/28/24 0507 11/28/24 1159  BP: (!) 159/51 (!) 147/57 (!) 145/65 (!) 136/56  Pulse: 80 81 79 72  Resp:  18 18 16   Temp: 98.1 F (36.7 C) 97.9 F (36.6 C) 97.9 F (36.6 C) 97.6 F (36.4 C)  TempSrc:  Oral Oral Oral  SpO2: 100% 99% 98% 100%  Weight:      Height:        Intake/Output Summary (Last 24 hours) at 11/28/2024 1314 Last data filed at 11/28/2024 1011 Gross per 24 hour  Intake 920 ml  Output 2200 ml  Net -1280 ml   Filed Weights   11/26/24 1842  Weight: (!) 149 kg    Examination:  General exam: NAD Respiratory system: Lungs clear to auscultation bilaterally.  No wheezes, no crackles, no rhonchi.  Fair air movement.  Speaking in full sentences.  No use of accessory muscles of respiration.  Cardiovascular system: Regular rate rhythm no murmurs rubs or gallops.  No JVD.  No lower extremity pitting edema.   Gastrointestinal system: Abdomen is soft, obese, nondistended, significantly decreased tenderness to palpation in the suprapubic region.  Positive bowel sounds.  No rebound.  No guarding.  No CVA TTP.   Central nervous system: Alert and oriented. No focal neurological deficits. Extremities: Symmetric 5 x 5 power. Skin: Chronic venous stasis changes noted in bilateral lower extremities.  Psychiatry: Judgement and insight appear normal. Mood & affect appropriate.     Data Reviewed: I have personally reviewed following labs and imaging studies  CBC: Recent Labs  Lab 11/26/24 1134 11/27/24 0414 11/28/24 0432  WBC 11.7* 6.4 6.1  NEUTROABS 8.1*  --   --   HGB 11.4* 11.1* 10.9*  HCT 35.2* 35.0* 34.1*  MCV 91.9 93.8 92.9  PLT 246 211 227    Basic Metabolic Panel: Recent Labs  Lab 11/26/24 1049 11/27/24 0414 11/28/24 0432  NA 138 142 142  K 4.3 4.0 4.4  CL 102 106 105  CO2 27 27 30   GLUCOSE 85 78 95  BUN 13 10 9   CREATININE  1.03* 0.91 0.87  CALCIUM  9.6 8.8* 8.8*    GFR: Estimated Creatinine Clearance: 90.1 mL/min (by C-G formula based on SCr of 0.87 mg/dL).  Liver Function Tests: No results for input(s): AST, ALT, ALKPHOS, BILITOT, PROT, ALBUMIN  in the last 168 hours.  CBG: Recent Labs  Lab 11/27/24 1216 11/27/24 1715 11/27/24 2120 11/28/24 0804 11/28/24 1154  GLUCAP 73 98 128* 101* 123*     Recent Results (from the past 240 hours)  Urine Culture (for pregnant, neutropenic or urologic patients or patients with an indwelling urinary catheter)     Status: Abnormal   Collection Time: 11/26/24  3:13 PM   Specimen: Urine, Clean Catch  Result Value Ref Range Status   Specimen Description   Final    URINE, CLEAN CATCH Performed at [facility], [address], [place], [place] [address]    Special Requests   Final    NONE Performed at [facility], [address], [place], [place] [address]    Culture MULTIPLE SPECIES PRESENT, SUGGEST RECOLLECTION (A)  Final   Report Status 11/27/2024 FINAL  Final         Radiology Studies: CT ABDOMEN PELVIS W CONTRAST Result Date: 11/26/2024 CLINICAL DATA:  Abdominal pain, acute nonlocalized. Flank pain, stone suspected. Chronic low back pain. EXAM: CT ABDOMEN AND PELVIS WITH CONTRAST TECHNIQUE: Multidetector CT imaging of the abdomen and pelvis was performed using the standard protocol following bolus administration of intravenous contrast. RADIATION DOSE REDUCTION: This exam was performed according to the departmental dose-optimization program which includes automated exposure control, adjustment of the mA and/or kV according to patient size and/or use of iterative reconstruction technique. CONTRAST:  OMNIPAQUE  IOHEXOL  300 MG/ML  SOLN COMPARISON:  04/28/2024. FINDINGS: Lower chest: The heart is enlarged and coronary artery calcifications are noted. Atelectasis is present at the lung bases. Hepatobiliary: No  focal liver abnormality is seen. No gallstones, gallbladder wall thickening, or biliary dilatation. Pancreas: Unremarkable. No pancreatic ductal dilatation or surrounding inflammatory changes. Spleen: Normal in size without focal abnormality. Adrenals/Urinary Tract: The adrenal glands are within normal limits. The kidneys enhance symmetrically. No renal calculus or obstructive uropathy is seen bilaterally. The bladder is unremarkable. Stomach/Bowel: The stomach is within normal limits. No bowel obstruction, free air, or pneumatosis is seen. Appendix appears normal. A moderate amount of retained stool is present in the [place]. Vascular/Lymphatic: Aortic atherosclerosis. There is a stable nonspecific enlarged lymph node at the porta hepatis measuring 1.1 cm. Remaining abdominopelvic lymph nodes are normal by size criteria. Reproductive: Status post hysterectomy. No adnexal masses. Other: No abdominopelvic ascites. A fat containing umbilical hernia is noted. Musculoskeletal: Degenerative changes are present in the thoracolumbar spine. No acute osseous abnormality is seen. IMPRESSION: 1. No acute intra-abdominal process. 2. Moderate amount of retained stool in the [place] suggesting constipation. 3. Aortic atherosclerosis and coronary artery calcifications. Electronically Signed   By: Leita Birmingham M.D.   On: 11/26/2024 14:28        Scheduled Meds:  cholecalciferol   1,000 Units Oral Daily   cyanocobalamin   1,000 mcg Oral Daily   enoxaparin  (LOVENOX ) injection  60 mg Subcutaneous Q24H   famotidine   40 mg Oral QHS   feeding supplement  237 mL Oral BID BM   fluticasone   1 spray Each Nare Daily   insulin  aspart  0-15 Units Subcutaneous TID WC   insulin  aspart  0-5 Units Subcutaneous QHS   ketorolac   15 mg Intravenous Once   lidocaine   1 patch Transdermal Q24H   metolazone   2.5 mg Oral Weekly   metoprolol  succinate  25 mg Oral Daily   pantoprazole   40 mg Oral Daily   polyethylene glycol  17 g Oral BID    potassium chloride  SA  40 mEq Oral Daily   pramipexole   0.5 mg Oral TID   senna-docusate  2 tablet Oral BID   sorbitol   30 mL Oral Once   sorbitol   30 mL Oral Q3H   spironolactone   25 mg Oral Daily   torsemide   40 mg Oral BID   Continuous Infusions:  cefTRIAXone  (ROCEPHIN )  IV Stopped (11/28/24 9061)     LOS: 2 days    Time spent: 40 minutes    Toribio Hummer, MD [facility]   To contact the attending provider between  7A-7P or the covering provider during after hours 7P-7A, please log into the web site [email or web address] and access using universal [place] password for that web site. If you do not have the password, please call the hospital operator.  11/28/2024, 1:14 PM

## 2024-11-28 NOTE — TOC Progression Note (Signed)
 Transition of Care [facility]) - Progression Note    Patient Details  Name: Maria [name] MRN: [id number] Date of Birth: [birth year]  [facility] CM/SW Contact  Doneta Glenys DASEN, RN Phone Number: 11/28/2024, 10:15 AM  Clinical Narrative:    [employer]. FL2 completed. Insurance auth pending. Auth ID [id number]    Expected Discharge Plan: Skilled Nursing Facility Barriers to Discharge: Continued Medical Work up, SNF Pending bed offer               Expected Discharge Plan and Services In-house Referral: NA Discharge Planning Services: CM Consult   Living arrangements for the past 2 months: Apartment                 DME Arranged: N/A DME Agency: NA       HH Arranged: NA HH Agency: NA         Social Drivers of Health (SDOH) Interventions SDOH Screenings   Food Insecurity: No Food Insecurity (11/26/2024)  Housing: Low Risk  (11/26/2024)  Transportation Needs: No Transportation Needs (11/26/2024)  Utilities: Not At Risk (11/26/2024)  Alcohol  Screen: Low Risk  (12/30/2021)  Depression (PHQ2-9): Low Risk  (11/14/2024)  Financial Resource Strain: Medium Risk (12/30/2021)  Physical Activity: Inactive (12/30/2021)  Social Connections: Socially Isolated (11/26/2024)  Stress: No Stress Concern Present (12/30/2021)  Tobacco Use: Medium Risk (11/26/2024)    Readmission Risk Interventions    11/27/2024    3:08 PM 09/04/2024    4:45 PM 05/03/2024   10:25 AM  Readmission Risk Prevention Plan  Transportation Screening Complete Complete Complete  Medication Review Oceanographer) Complete Complete Complete  PCP or Specialist appointment within 3-5 days of discharge Complete Complete Complete  HRI or Home Care Consult Complete Complete Complete  SW Recovery Care/Counseling Consult Complete Complete Complete  Palliative Care Screening Not Applicable Not Applicable Not Applicable  Skilled Nursing Facility Complete Complete Not Applicable

## 2024-11-28 NOTE — Progress Notes (Signed)
 Mobility Specialist - Progress Note   11/28/24 1100  Mobility  Activity Pivoted/transferred from bed to chair  Level of Assistance Minimal assist, patient does 75% or more  Assistive Device Stedy  Range of Motion/Exercises Active  Activity Response Tolerated well  Mobility Referral Yes  Mobility visit 1 Mobility  Mobility Specialist Start Time (ACUTE ONLY) 0957  Mobility Specialist Stop Time (ACUTE ONLY) 1033  Mobility Specialist Time Calculation (min) (ACUTE ONLY) 36 min   Received in bed and agreed to mobility.  Supervision for bed mobility, Min A for sit to stand using steady, 2nd attempt successful for transfer.   Now in chair, pt completed 3 sit to stands contact guard, using arms on chair and walker. Upon 2nd stand, pt instructed to sway side to side three times to bare weight throughout both LE's. During 3rd stand pt instructed to march in place.  Endorsed pain throughout L hip when bearing weight for march.   Returned to chair with all needs met.   Cyndee Ada Mobility Specialist

## 2024-11-29 ENCOUNTER — Inpatient Hospital Stay (HOSPITAL_COMMUNITY)

## 2024-11-29 DIAGNOSIS — K5900 Constipation, unspecified: Secondary | ICD-10-CM | POA: Diagnosis not present

## 2024-11-29 DIAGNOSIS — Z6841 Body Mass Index (BMI) 40.0 and over, adult: Secondary | ICD-10-CM | POA: Diagnosis not present

## 2024-11-29 DIAGNOSIS — G8929 Other chronic pain: Secondary | ICD-10-CM | POA: Diagnosis not present

## 2024-11-29 DIAGNOSIS — K219 Gastro-esophageal reflux disease without esophagitis: Secondary | ICD-10-CM | POA: Diagnosis not present

## 2024-11-29 LAB — BASIC METABOLIC PANEL WITH GFR
Anion gap: 6 (ref 5–15)
BUN: 14 mg/dL (ref 8–23)
CO2: 35 mmol/L — ABNORMAL HIGH (ref 22–32)
Calcium: 9 mg/dL (ref 8.9–10.3)
Chloride: 99 mmol/L (ref 98–111)
Creatinine, Ser: 1.01 mg/dL — ABNORMAL HIGH (ref 0.44–1.00)
GFR, Estimated: >60 mL/min (ref >60)
Glucose, Bld: 88 mg/dL (ref 70–99)
Potassium: 4.7 mmol/L (ref 3.5–5.1)
Sodium: 140 mmol/L (ref 135–145)

## 2024-11-29 LAB — CBC
HCT: 34.7 % — ABNORMAL LOW (ref 36.0–46.0)
Hemoglobin: 11.2 g/dL — ABNORMAL LOW (ref 12.0–15.0)
MCH: 29.8 pg (ref 26.0–34.0)
MCHC: 32.3 g/dL (ref 30.0–36.0)
MCV: 92.3 fL (ref 80.0–100.0)
Platelets: 234 K/uL (ref 150–400)
RBC: 3.76 MIL/uL — ABNORMAL LOW (ref 3.87–5.11)
RDW: 13.7 % (ref 11.5–15.5)
WBC: 6.8 K/uL (ref 4.0–10.5)
nRBC: 0 % (ref 0.0–0.2)

## 2024-11-29 LAB — GLUCOSE, CAPILLARY
Glucose-Capillary: 86 mg/dL (ref 70–99)
Glucose-Capillary: 104 mg/dL — ABNORMAL HIGH (ref 70–99)
Glucose-Capillary: 111 mg/dL — ABNORMAL HIGH (ref 70–99)
Glucose-Capillary: 87 mg/dL (ref 70–99)

## 2024-11-29 NOTE — Progress Notes (Signed)
 PROGRESS NOTE    Maria [name]  FMW:[id number] DOB: 1956/10/12 DOA: 11/26/2024 PCP: Ileen [name], NP   Brief Narrative:  The Patient 68 year old female history of morbid obesity, depression, hypertension, GERD, chronic low back pain, diabetes, HFpEF who presented to the ED with acute low back pain and left lower quadrant abdominal pain with workup concerning for UTI.  Patient placed empirically on IV antibiotics.  She steadily improved and PT/OT recommended SNF.  She appears to have improved medically but unfortunately no bed available today and bed will be available 11/30/2024 so will be discharged then.   Assessment and Plan:  UTI: Patient on presentation with complaints of left lower quadrant abdominal pain, acute on chronic low back pain with increased frequency of urination and felt like she had a urinary tract infection. Patient noted to have a mild leukocytosis and Patient noted with history of Klebsiella UTI. U/A obtained and showed a clear appearance with negative ketones, negative leukocytes, positive nitrites, many bacteria, 0-5 RBCs per high-power field, 0-5 squamous epithelial cells and 0-5 WBCs with urine culture showing multiple species present.  She is already placed on IV ceftriaxone  but will continue to treat empirically and she has now been de-escalated to p.o. cefadroxil  for 2 days.  Given this her symptoms have improved clinically.  Continue Supportive care.   Abdominal Pain/Constipation: Abdominal pain felt likely secondary to problem above Patient noted to have significant tenderness to palpation left lower quadrant per admitting physician. CT abdomen and pelvis done with no acute intra-abdominal process.  Moderate amount of retained stool in the [place] suggesting constipation.  Aortic atherosclerosis and coronary artery calcifications. -Placed on Aggressive bowel regimen  w/ of MiraLAX  twice daily, Senokot-S 2 tablet twice daily.  Sorbitol  p.o. x 1. -C/w Supportive  care.   Chronic HFpEF: Patient noted to be euvolemic on examination with no significant volume overload noted.  C/w Metoprolol  Succinate 25 mg po Daily, Metolazone  2.5 mg po Weekly, Spironolactone  25 mg po Daily, and Torsemide  40 mg po BID.   RLS: C/w Pramipexole  0.5 mg po TID   Chronic Low Back Pain and Knee Pain: Patient with known spinal disease. CT with severe right central canal stenosis with broad-based disc protrusion at L3-L4 as well as advanced facet hypertrophy and stable moderate to severe right and moderate left foraminal stenosis.  Also noted to have moderate central canal stenosis at L4-L5 due to broad-based disc protrusion and advanced facet hypertrophy and a left greater than right subarticular recess narrowing was present as well as moderate foraminal stenosis that is worse on the left.  She also had a stable far left lateral disc protrusion at L2-L3 with moderate left greater than right foraminal stenosis.  -PT/OT recommending SNF -Patient states she was on 10 mg of oxycodone  as needed prior to admission and as such we will increase current dose of 5 mg oxycodone  back to home regimen of 10 mg oxycodone  q4hprn. -C/w Diclofenax Sodium gel 2 grams topical 4x Daily, Lidocaine  1 patch TD, IV Ketorolac  30 mg q6hprn Moderate Pain, and IV Fentanyl  12.5-50 mcg q2hprn Severe Pain -Also has Methocarbamol  500 mg po q6hprn Muscle Spasms -May benefit from outpatient orthopedic/spine evaluation   Diabetes Mellitus Type 2: Hemoglobin A1c 5.0 (09/01/2024). Patient noted to be on Mounjaro prior to admission. On Moderate Novolog  SSI AC/HS. CTM CBG per Protocol and current Trend:  Recent Labs  Lab 11/27/24 2120 11/28/24 0804 11/28/24 1154 11/28/24 1812 11/28/24 2129 11/29/24 0731 11/29/24 1212  GLUCAP 128* 101*  123* 92 102* 86 104*   Anxiety: C/w Clonazepam  1 mg po BIDprn Anxiety   GERD/GI Prophylaxis: Continue Pantoprazole  40 mg po Daily and Famotidine  40 mg po qHS.   Normocytic Anemia:  Fairly stable Hgb/Hct Trend: Recent Labs  Lab 11/26/24 1134 11/27/24 0414 11/28/24 0432 11/29/24 0442  HGB 11.4* 11.1* 10.9* 11.2*  HCT 35.2* 35.0* 34.1* 34.7*  MCV 91.9 93.8 92.9 92.3  -Check Anemia Panel in the outpt setting. CTM for S/Sx of Bleeding; No overt bleeding noted. Repeat CBC in the AM  Class III (Super Morbid) Obesity: Complicates overall prognosis and care. Estimated body mass index is 58.19 kg/m as calculated from the following:   Height as of this encounter: 5' 3 (1.6 m).   Weight as of this encounter: 149 kg. Weight Loss and Dietary Counseling given   DVT prophylaxis: Enoxaparin  60 mg sq q24h    Code Status: Full Code Family Communication: No family present @ bedside  Disposition Plan:  Level of care: Med-Surg Status is: Inpatient Remains inpatient appropriate because: Has improved to go to SNF but facility will not be able to accept the patient until 11/30/24 due to lack of bed availability    Consultants:  None  Procedures:  As delineated as above   Antimicrobials:  Anti-infectives (From admission, onward)    Start     Dose/Rate Route Frequency Ordered Stop   11/29/24 1000  cefadroxil  (DURICEF) capsule 1,000 mg        1,000 mg Oral 2 times daily 11/28/24 1826 12/01/24 0959   11/27/24 1000  cefTRIAXone  (ROCEPHIN ) 2 g in sodium chloride  0.9 % 100 mL IVPB  Status:  Discontinued        2 g 200 mL/hr over 30 Minutes Intravenous Daily 11/26/24 1329 11/28/24 1826   11/26/24 1245  cefTRIAXone  (ROCEPHIN ) 2 g in sodium chloride  0.9 % 100 mL IVPB        2 g 200 mL/hr over 30 Minutes Intravenous  Once 11/26/24 1235 11/26/24 1340   11/26/24 1245  metroNIDAZOLE  (FLAGYL ) IVPB 500 mg        500 mg 100 mL/hr over 60 Minutes Intravenous  Once 11/26/24 1235 11/26/24 1513       Subjective: Seen and examined at bedside and was complaining of some left-sided knee pain that radiated up into her hip as well as some back pain which is chronic.  No nausea or vomiting.   Denies lightheadedness or dizziness.  No other concerns or complaints at this time.  Objective: Vitals:   11/28/24 1159 11/28/24 1927 11/29/24 0557 11/29/24 1215  BP: (!) 136/56 (!) 134/57 127/61 (!) 118/52  Pulse: 72 75 66 66  Resp: 16 18 14 18   Temp: 97.6 F (36.4 C) 98 F (36.7 C) 97.7 F (36.5 C) 98.2 F (36.8 C)  TempSrc: Oral Oral Oral Oral  SpO2: 100% 99% 96% 96%  Weight:      Height:        Intake/Output Summary (Last 24 hours) at 11/29/2024 1503 Last data filed at 11/29/2024 1300 Gross per 24 hour  Intake 960 ml  Output 5400 ml  Net -4440 ml   Filed Weights   11/26/24 1842  Weight: (!) 149 kg   Examination: Physical Exam:  Constitutional: WN/WD, morbidly obese Caucasian female in no acute distress appears a little uncomfortable Respiratory: Diminished to auscultation bilaterally, no wheezing, rales, rhonchi or crackles. Normal respiratory effort and patient is not tachypenic. No accessory muscle use.  Unlabored breathing Cardiovascular: RRR, no  murmurs / rubs / gallops. S1 and S2 auscultated.  Is 1+ extremity edema Abdomen: Soft, non-tender, distended secondary to body habitus. Bowel sounds positive.  GU: Deferred. Musculoskeletal: No clubbing / cyanosis of digits/nails. No joint deformity upper and lower extremities. Skin: No rashes, lesions, ulcers on limited skin evaluation. No induration; Warm and dry.  Neurologic: CN 2-12 grossly intact with no focal deficits. Romberg sign and cerebellar reflexes not assessed.  Psychiatric: Normal judgment and insight. Alert and oriented x 3. Normal mood and appropriate affect.   Data Reviewed: I have personally reviewed following labs and imaging studies  CBC: Recent Labs  Lab 11/26/24 1134 11/27/24 0414 11/28/24 0432 11/29/24 0442  WBC 11.7* 6.4 6.1 6.8  NEUTROABS 8.1*  --   --   --   HGB 11.4* 11.1* 10.9* 11.2*  HCT 35.2* 35.0* 34.1* 34.7*  MCV 91.9 93.8 92.9 92.3  PLT 246 211 227 234   Basic Metabolic  Panel: Recent Labs  Lab 11/26/24 1049 11/27/24 0414 11/28/24 0432 11/29/24 0442  NA 138 142 142 140  K 4.3 4.0 4.4 4.7  CL 102 106 105 99  CO2 27 27 30  35*  GLUCOSE 85 78 95 88  BUN 13 10 9 14   CREATININE 1.03* 0.91 0.87 1.01*  CALCIUM  9.6 8.8* 8.8* 9.0   GFR: Estimated Creatinine Clearance: 77.6 mL/min (A) (by C-G formula based on SCr of 1.01 mg/dL (H)). Liver Function Tests: No results for input(s): AST, ALT, ALKPHOS, BILITOT, PROT, ALBUMIN  in the last 168 hours. No results for input(s): LIPASE, AMYLASE in the last 168 hours. No results for input(s): AMMONIA in the last 168 hours. Coagulation Profile: No results for input(s): INR, PROTIME in the last 168 hours. Cardiac Enzymes: No results for input(s): CKTOTAL, CKMB, CKMBINDEX, TROPONINI in the last 168 hours. BNP (last 3 results) Recent Labs    10/24/24 0430 10/26/24 0656  PROBNP 151.0 96.9   HbA1C: No results for input(s): HGBA1C in the last 72 hours. CBG: Recent Labs  Lab 11/28/24 1154 11/28/24 1812 11/28/24 2129 11/29/24 0731 11/29/24 1212  GLUCAP 123* 92 102* 86 104*   Lipid Profile: No results for input(s): CHOL, HDL, LDLCALC, TRIG, CHOLHDL, LDLDIRECT in the last 72 hours. Thyroid  Function Tests: No results for input(s): TSH, T4TOTAL, FREET4, T3FREE, THYROIDAB in the last 72 hours. Anemia Panel: No results for input(s): VITAMINB12, FOLATE, FERRITIN, TIBC, IRON, RETICCTPCT in the last 72 hours. Sepsis Labs: No results for input(s): PROCALCITON, LATICACIDVEN in the last 168 hours.  Recent Results (from the past 240 hours)  Urine Culture (for pregnant, neutropenic or urologic patients or patients with an indwelling urinary catheter)     Status: Abnormal   Collection Time: 11/26/24  3:13 PM   Specimen: Urine, Clean Catch  Result Value Ref Range Status   Specimen Description   Final    URINE, CLEAN CATCH Performed at [facility], [address] [place], [place] [address]    Special Requests   Final    NONE Performed at [facility], [address] [place], [place] [address]    Culture MULTIPLE SPECIES PRESENT, SUGGEST RECOLLECTION (A)  Final   Report Status 11/27/2024 FINAL  Final    Radiology Studies: No results found.  Scheduled Meds:  cefadroxil   1,000 mg Oral BID   cholecalciferol   1,000 Units Oral Daily   cyanocobalamin   1,000 mcg Oral Daily   diclofenac  Sodium  2 g Topical QID   enoxaparin  (LOVENOX ) injection  60  mg Subcutaneous Q24H   famotidine   40 mg Oral QHS   fluticasone   1 spray Each Nare Daily   insulin  aspart  0-15 Units Subcutaneous TID WC   insulin  aspart  0-5 Units Subcutaneous QHS   ketorolac   15 mg Intravenous Once   lidocaine   1 patch Transdermal Q24H   metolazone   2.5 mg Oral Weekly   metoprolol  succinate  25 mg Oral Daily   pantoprazole   40 mg Oral Daily   polyethylene glycol  17 g Oral BID   potassium chloride  SA  40 mEq Oral Daily   pramipexole   0.5 mg Oral TID   senna-docusate  2 tablet Oral BID   sorbitol   30 mL Oral Once   spironolactone   25 mg Oral Daily   torsemide   40 mg Oral BID   Continuous Infusions:   LOS: 3 days   Alejandro Marker, DO [facility] Available via Epic secure chat 7am-7pm After these hours, please refer to coverage provider listed on [email or web address] 11/29/2024, 3:03 PM

## 2024-11-29 NOTE — Plan of Care (Signed)

## 2024-11-29 NOTE — Progress Notes (Signed)
 Physical Therapy Treatment Patient Details Name: Maria [name] MRN: [id number] DOB: 1956-06-28 Today's Date: 11/29/2024   History of Present Illness 68 y.o. female who presents to the emergency department with acute low back pain and left lower quadrant abdominal pain found to have UTI. Recent admission 11/8-10/1024 with back pain, pt DCed to SNF, then was home for 3 days prior to this admission. Pt with medical history significant for morbid obesity, depression, hypertension, GERD, chronic low back pain, diabetes, heart failure with preserved EF    PT Comments  Pt AxO x 3 pleasant and willing.  Lives home alone.  Assisted OOB was difficult.  General bed mobility comments: Pt required increased time, HOB elevated and use of rails.  Great difficulty self moving L LE off bed due to hip pain.  Also required assist with upper body.  Even greater difficulty self scooting to EOB.  Used bed pad to assist. Assisted with standing was limited.  General transfer comment: several attempts to complete as well as forward momentum due to BMI.  Pt was able to static stand with walker and support self however had difficulty weightshifting onto L LE to complete 1/4 pivot from bed to recliner.  Limited by L hip pain and weakness/fatigue. Pt was unable to attempt amb.  Positioned in recliner to comfort.  LPT has rec Pt will need ST Rehab at SNF to address mobility and functional decline prior to safely returning home.    If plan is discharge home, recommend the following: Two people to help with walking and/or transfers;A lot of help with bathing/dressing/bathroom;Assistance with cooking/housework;Assist for transportation;Help with stairs or ramp for entrance   Can travel by private vehicle     No  Equipment Recommendations  None recommended by PT    Recommendations for Other Services       Precautions / Restrictions Precautions Precautions: Fall Restrictions Weight Bearing Restrictions Per Provider  Order: No     Mobility  Bed Mobility Overal bed mobility: Needs Assistance Bed Mobility: Rolling, Sidelying to Sit Rolling: Supervision, Contact guard assist Sidelying to sit: Mod assist, Max assist       General bed mobility comments: Pt required increased time, HOB elevated and use of rails.  Great difficulty self moving L LE off bed due to hip pain.  Also required assist with upper body.  Even greater difficulty self scooting to EOB.  Used bed pad to assist.    Transfers Overall transfer level: Needs assistance Equipment used: Rolling walker (2 wheels) Transfers: Sit to/from Stand Sit to Stand: Mod assist, +2 safety/equipment, +2 physical assistance           General transfer comment: several attempts to complete as well as forward momentum due to BMI.  Pt was able to static stand with walker and support self however had difficulty weightshifting onto L LE to complete 1/4 pivot from bed to recliner.  Limited by L hip pain and weakness/fatigue.    Ambulation/Gait               General Gait Details: transfers only   Stairs             Wheelchair Mobility     Tilt Bed    Modified Rankin (Stroke Patients Only)       Balance  Communication Communication Communication: No apparent difficulties  Cognition   Behavior During Therapy: WFL for tasks assessed/performed   PT - Cognitive impairments: No apparent impairments                       PT - Cognition Comments: AxO x 3 pleasant and willing.  Limited by pain. Following commands: Intact      Cueing Cueing Techniques: Verbal cues  Exercises      General Comments        Pertinent Vitals/Pain Pain Assessment Pain Assessment: 0-10 Pain Score: 7  Pain Location: back, L hip Pain Descriptors / Indicators: Grimacing, Sore, Guarding Pain Intervention(s): Monitored during session, Repositioned    Home Living                           Prior Function            PT Goals (current goals can now be found in the care plan section) Progress towards PT goals: Progressing toward goals    Frequency    Min 2X/week      PT Plan      Co-evaluation              AM-PAC PT 6 Clicks Mobility   Outcome Measure  Help needed turning from your back to your side while in a flat bed without using bedrails?: A Lot Help needed moving from lying on your back to sitting on the side of a flat bed without using bedrails?: A Lot Help needed moving to and from a bed to a chair (including a wheelchair)?: A Lot Help needed standing up from a chair using your arms (e.g., wheelchair or bedside chair)?: A Lot Help needed to walk in hospital room?: A Lot Help needed climbing 3-5 steps with a railing? : Total 6 Click Score: 11    End of Session Equipment Utilized During Treatment: Gait belt Activity Tolerance: Patient limited by fatigue;Patient limited by pain Patient left: in chair;with call bell/phone within reach Nurse Communication: Mobility status PT Visit Diagnosis: Difficulty in walking, not elsewhere classified (R26.2);Pain;Unsteadiness on feet (R26.81);Muscle weakness (generalized) (M62.81);History of falling (Z91.81) Pain - Right/Left: Left Pain - part of body: Hip     Time: 1136-1200 PT Time Calculation (min) (ACUTE ONLY): 24 min  Charges:    $Therapeutic Activity: 23-37 mins PT General Charges $$ ACUTE PT VISIT: 1 Visit                     Katheryn Leap  PTA Acute  Rehabilitation Services Office M-F          [phone]

## 2024-11-29 NOTE — Hospital Course (Addendum)
 The Patient 68 year old female history of morbid obesity, depression, hypertension, GERD, chronic low back pain, diabetes, HFpEF who presented to the ED with acute low back pain and left lower quadrant abdominal pain with workup concerning for UTI.  Patient placed empirically on IV antibiotics.  She steadily improved and PT/OT recommended SNF.  She appears to have improved medically and is stable for discharge and bed is available 11/30/2024.  Assessment and Plan:  UTI: Patient on presentation with complaints of left lower quadrant abdominal pain, acute on chronic low back pain with increased frequency of urination and felt like she had a urinary tract infection. Patient noted to have a mild leukocytosis and Patient noted with history of Klebsiella UTI. U/A obtained and showed a clear appearance with negative ketones, negative leukocytes, positive nitrites, many bacteria, 0-5 RBCs per high-power field, 0-5 squamous epithelial cells and 0-5 WBCs with urine culture showing multiple species present.  She is already placed on IV ceftriaxone  but will continue to treat empirically and she has now been de-escalated to p.o. cefadroxil  for 2 days.  Given this her symptoms have improved clinically.  Continue Supportive care.   Abdominal Pain/Constipation: Abdominal pain felt likely secondary to problem above Patient noted to have significant tenderness to palpation left lower quadrant per admitting physician. CT abdomen and pelvis done with no acute intra-abdominal process.  Moderate amount of retained stool in the [place] suggesting constipation.  Aortic atherosclerosis and coronary artery calcifications. -Placed on Aggressive bowel regimen  w/ of MiraLAX  twice daily, Senokot-S 2 tablet twice daily.  Sorbitol  p.o. x 1. -C/w Supportive care.  Cough: Already on antibiotics as above but will add azithromycin .  Initiate flutter valve and incentive spirometry.  Continue with antitussives.  Check chest x-ray in the  outpatient setting.  The lower chest on the CT scan showed atelectasis so we will continue with flutter valve incentive spirometry.  No hypoxia noted.  Continue supportive care   Chronic HFpEF: Patient noted to be euvolemic on examination with no significant volume overload noted.  C/w Metoprolol  Succinate 25 mg po Daily, Metolazone  2.5 mg po Weekly, Spironolactone  25 mg po Daily, and Torsemide  40 mg po BID. follow-up with Dr. Lavona within 1 to 2 weeks  RLS: C/w Pramipexole  0.5 mg po TID   Chronic Low Back Pain and Knee Pain: Patient with known spinal disease. CT with severe right central canal stenosis with broad-based disc protrusion at L3-L4 as well as advanced facet hypertrophy and stable moderate to severe right and moderate left foraminal stenosis.  Also noted to have moderate central canal stenosis at L4-L5 due to broad-based disc protrusion and advanced facet hypertrophy and a left greater than right subarticular recess narrowing was present as well as moderate foraminal stenosis that is worse on the left.  She also had a stable far left lateral disc protrusion at L2-L3 with moderate left greater than right foraminal stenosis.  -PT/OT recommending SNF -Patient states she was on 10 mg of oxycodone  as needed prior to admission and as such we will increase current dose of 5 mg oxycodone  back to home regimen of 10 mg oxycodone  q4hprn. 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 Patient  noted to be on Mounjaro prior to admission. On Moderate Novolog  SSI AC/HS. CTM CBG per Protocol and current Trend:  Recent Labs  Lab 11/28/24 1812  11/28/24 2129 11/29/24 0731 11/29/24 1212 11/29/24 1710 11/29/24 2114 11/30/24 0756  GLUCAP 92 102* 86 104* 111* 87 96   Anxiety: C/w Clonazepam  1 mg po BIDprn Anxiety   GERD/GI Prophylaxis: Continue Pantoprazole  40 mg po Daily and Famotidine  40 mg po qHS.  Left knee pain: Will obtain left knee x-ray.  Continue analgesics as above   Normocytic Anemia: Fairly stable Hgb/Hct Trend: Recent Labs  Lab 11/26/24 1134 11/27/24 0414 11/28/24 0432 11/29/24 0442  HGB 11.4* 11.1* 10.9* 11.2*  HCT 35.2* 35.0* 34.1* 34.7*  MCV 91.9 93.8 92.9 92.3  -Check Anemia Panel in the outpt setting. CTM for S/Sx of Bleeding; No overt bleeding noted. Repeat CBC in the AM  Class III (Super Morbid) Obesity: Complicates overall prognosis and care. Estimated body mass index is 58.19 kg/m as calculated from the following:   Height as of this encounter: 5' 3 (1.6 m).   Weight as of this encounter: 149 kg. Weight Loss and Dietary Counseling given

## 2024-11-29 NOTE — TOC Progression Note (Signed)
 Transition of Care [facility]) - Progression Note    Patient Details  Name: Maria [name] MRN: [id number] Date of Birth: 10-Feb-1956  [facility] of [facility] CM/SW Contact  Doneta Glenys DASEN, RN Phone Number: 11/29/2024, 10:15 AM  Clinical Narrative:    Insurance auth approved. Plan Auth ID [id number]. Insurance is requesting a PT note in the last 48 hrs. CM notified PT to update when note is complete. [facility] Tammy update for private room request.   Expected Discharge Plan: Skilled Nursing Facility Barriers to Discharge: Continued Medical Work up, SNF Pending bed offer               Expected Discharge Plan and Services In-house Referral: NA Discharge Planning Services: CM Consult   Living arrangements for the past 2 months: Apartment                 DME Arranged: N/A DME Agency: NA       HH Arranged: NA HH Agency: NA         Social Drivers of Health (SDOH) Interventions SDOH Screenings   Food Insecurity: No Food Insecurity (11/26/2024)  Housing: Low Risk  (11/26/2024)  Transportation Needs: No Transportation Needs (11/26/2024)  Utilities: Not At Risk (11/26/2024)  Alcohol  Screen: Low Risk  (12/30/2021)  Depression (PHQ2-9): Low Risk  (11/14/2024)  Financial Resource Strain: Medium Risk (12/30/2021)  Physical Activity: Inactive (12/30/2021)  Social Connections: Socially Isolated (11/26/2024)  Stress: No Stress Concern Present (12/30/2021)  Tobacco Use: Medium Risk (11/26/2024)    Readmission Risk Interventions    11/27/2024    3:08 PM 09/04/2024    4:45 PM 05/03/2024   10:25 AM  Readmission Risk Prevention Plan  Transportation Screening Complete Complete Complete  Medication Review Oceanographer) Complete Complete Complete  PCP or Specialist appointment within 3-5 days of discharge Complete Complete Complete  HRI or Home Care Consult Complete Complete Complete  SW Recovery Care/Counseling Consult Complete Complete Complete  Palliative Care Screening Not  Applicable Not Applicable Not Applicable  Skilled Nursing Facility Complete Complete Not Applicable

## 2024-11-30 ENCOUNTER — Telehealth (INDEPENDENT_AMBULATORY_CARE_PROVIDER_SITE_OTHER): Payer: Self-pay

## 2024-11-30 DIAGNOSIS — N3000 Acute cystitis without hematuria: Secondary | ICD-10-CM | POA: Diagnosis not present

## 2024-11-30 DIAGNOSIS — I5032 Chronic diastolic (congestive) heart failure: Secondary | ICD-10-CM | POA: Diagnosis not present

## 2024-11-30 DIAGNOSIS — M545 Low back pain, unspecified: Secondary | ICD-10-CM | POA: Diagnosis not present

## 2024-11-30 DIAGNOSIS — E119 Type 2 diabetes mellitus without complications: Secondary | ICD-10-CM | POA: Diagnosis not present

## 2024-11-30 LAB — GLUCOSE, CAPILLARY: Glucose-Capillary: 96 mg/dL (ref 70–99)

## 2024-11-30 MED ORDER — CEFADROXIL 500 MG PO CAPS: 1000.0000 mg | ORAL_CAPSULE | Freq: Two times a day (BID) | ORAL | 0 refills | Status: AC

## 2024-11-30 MED ORDER — ACETAMINOPHEN 325 MG PO TABS: 650.0000 mg | ORAL_TABLET | Freq: Four times a day (QID) | ORAL | Status: AC | PRN

## 2024-11-30 MED ORDER — TORSEMIDE 40 MG PO TABS: 40.0000 mg | ORAL_TABLET | Freq: Two times a day (BID) | ORAL | Status: AC

## 2024-11-30 MED ORDER — AZITHROMYCIN 250 MG PO TABS: 500.0000 mg | ORAL_TABLET | Freq: Every day | ORAL | Status: DC

## 2024-11-30 MED ORDER — POLYETHYLENE GLYCOL 3350 17 G PO PACK: 17.0000 g | PACK | Freq: Two times a day (BID) | ORAL | 0 refills | Status: AC

## 2024-11-30 MED ORDER — SENNOSIDES-DOCUSATE SODIUM 8.6-50 MG PO TABS: 2.0000 | ORAL_TABLET | Freq: Two times a day (BID) | ORAL | Status: AC

## 2024-11-30 MED ORDER — OXYCODONE HCL 10 MG PO TABS: 10.0000 mg | ORAL_TABLET | ORAL | 0 refills | Status: AC | PRN

## 2024-11-30 MED ORDER — CLONAZEPAM 1 MG PO TABS: 1.0000 mg | ORAL_TABLET | Freq: Two times a day (BID) | ORAL | 0 refills | Status: AC | PRN

## 2024-11-30 NOTE — Discharge Summary (Signed)
 Physician Discharge Summary   Patient: Maria [name] MRN: [id number] DOB: 09-Feb-1956  Admit date:     11/26/2024  Discharge date: 11/30/2024  Discharge Physician: Alejandro Marker, DO   PCP: Ileen [name], NP   Recommendations at discharge:   Follow-up with PCP within 1 to 2 weeks repeat CBC, CMP, mag, Phos within 1 week Follow-up with orthopedic surgery and spine surgery in the outpatient setting for back pain and chronic knee pain Follow-up with cardiology Dr. Lavona within 1 to 2 weeks Check chest x-ray in 3 to 6 weeks  Discharge Diagnoses: Principal Problem:   UTI (urinary tract infection) Active Problems:   Chronic diastolic HF (heart failure) (HCC)   Chronic low back pain   Morbid obesity with body mass index (BMI) of 50.0 to 59.9 in adult [facility])   GERD without esophagitis   Constipation   DM2 (diabetes mellitus, type 2) (HCC)   Acute cystitis   (HFpEF) heart failure with preserved ejection fraction (HCC)  Resolved Problems:   * No resolved hospital problems. [facility] Course: The Patient 68 year old female history of morbid obesity, depression, hypertension, GERD, chronic low back pain, diabetes, HFpEF who presented to the ED with acute low back pain and left lower quadrant abdominal pain with workup concerning for UTI.  Patient placed empirically on IV antibiotics.  She steadily improved and PT/OT recommended SNF.  She appears to have improved medically and is stable for discharge and bed is available 11/30/2024.  Assessment and Plan:  UTI: Patient on presentation with complaints of left lower quadrant abdominal pain, acute on chronic low back pain with increased frequency of urination and felt like she had a urinary tract infection. Patient noted to have a mild leukocytosis and Patient noted with history of Klebsiella UTI. U/A obtained and showed a clear appearance with negative ketones, negative leukocytes, positive nitrites, many bacteria, 0-5 RBCs per  high-power field, 0-5 squamous epithelial cells and 0-5 WBCs with urine culture showing multiple species present.  She is already placed on IV ceftriaxone  but will continue to treat empirically and she has now been de-escalated to p.o. cefadroxil  for 2 days.  Given this her symptoms have improved clinically.  Continue Supportive care.   Abdominal Pain/Constipation: Abdominal pain felt likely secondary to problem above Patient noted to have significant tenderness to palpation left lower quadrant per admitting physician. CT abdomen and pelvis done with no acute intra-abdominal process.  Moderate amount of retained stool in the [place] suggesting constipation.  Aortic atherosclerosis and coronary artery calcifications. -Placed on Aggressive bowel regimen  w/ of MiraLAX  twice daily, Senokot-S 2 tablet twice daily.  Sorbitol  p.o. x 1. -C/w Supportive care.  Cough: Already on antibiotics as above but will add azithromycin .  Initiate flutter valve and incentive spirometry.  Continue with antitussives.  Check chest x-ray in the outpatient setting.  The lower chest on the CT scan showed atelectasis so we will continue with flutter valve incentive spirometry.  No hypoxia noted.  Continue supportive care   Chronic HFpEF: Patient noted to be euvolemic on examination with no significant volume overload noted.  C/w Metoprolol  Succinate 25 mg po Daily, Metolazone  2.5 mg po Weekly, Spironolactone  25 mg po Daily, and Torsemide  40 mg po BID. follow-up with Dr. Lavona within 1 to 2 weeks  RLS: C/w Pramipexole  0.5 mg po TID   Chronic Low Back Pain and Knee Pain: Patient with known spinal disease. CT with severe right central canal stenosis with broad-based disc protrusion at L3-L4  as well as advanced facet hypertrophy and stable moderate to severe right and moderate left foraminal stenosis.  Also noted to have moderate central canal stenosis at L4-L5 due to broad-based disc protrusion and advanced facet hypertrophy and a  left greater than right subarticular recess narrowing was present as well as moderate foraminal stenosis that is worse on the left.  She also had a stable far left lateral disc protrusion at L2-L3 with moderate left greater than right foraminal stenosis.  -PT/OT recommending SNF -Patient states she was on 10 mg of oxycodone  as needed prior to admission and as such we will increase current dose of 5 mg oxycodone  back to home regimen of 10 mg oxycodone  q4hprn. 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 Patient noted to be on Mounjaro prior to admission. On Moderate Novolog  SSI AC/HS. CTM CBG per Protocol and current Trend:  Recent Labs  Lab 11/28/24 1812 11/28/24 2129 11/29/24 0731 11/29/24 1212 11/29/24 1710 11/29/24 2114 11/30/24 0756  GLUCAP 92 102* 86 104* 111* 87 96   Anxiety: C/w Clonazepam  1 mg po BIDprn Anxiety   GERD/GI Prophylaxis: Continue Pantoprazole  40 mg po Daily and Famotidine  40 mg po qHS.  Left knee pain: Will obtain left knee x-ray.  Continue analgesics as above   Normocytic Anemia: Fairly stable Hgb/Hct Trend: Recent Labs  Lab 11/26/24 1134 11/27/24 0414 11/28/24 0432 11/29/24 0442  HGB 11.4* 11.1* 10.9* 11.2*  HCT 35.2* 35.0* 34.1* 34.7*  MCV 91.9 93.8 92.9 92.3  -Check Anemia Panel in the outpt setting. CTM for S/Sx of Bleeding; No overt bleeding noted. Repeat CBC in the AM  Class III (Super Morbid) Obesity: Complicates overall prognosis and care. Estimated body mass index is 58.19 kg/m as calculated from the following:   Height as of this encounter: 5' 3 (1.6 m).   Weight as of this  encounter: 149 kg. Weight Loss and Dietary Counseling given  Consultants: None Procedures performed: As delineated as above  Disposition: Skilled nursing facility  Diet recommendation:  Cardiac and Carb modified diet DISCHARGE MEDICATION: Allergies as of 11/30/2024       Reactions   Adhesive [tape] Other (See Comments)   Tears off skin - use paper tape!!   Amoxicillin -pot Clavulanate Nausea And Vomiting, Other (See Comments)   Projectile vomiting   Codeine Other (See Comments)   Hallucinations and headaches   Crestor  [rosuvastatin  Calcium ] Other (See Comments)   Severe muscle cramps   Cyclobenzaprine  Other (See Comments)   Hallucinations    Cymbalta  [duloxetine  Hcl] Other (See Comments)   Hallucinations    Gabapentin  Other (See Comments)   Trembling   Lexapro [escitalopram] Other (See Comments)   Hallucinations    Lyrica [pregabalin] Other (See Comments)   Weakness   Vicodin [hydrocodone-acetaminophen ] Other (See Comments)   Hallucinations   Wellbutrin [bupropion] Other (See Comments)   When combined with Mirapex , hallucinations occurred   Latex Rash        Medication List     TAKE these medications    pramipexole  0.5 MG tablet Commonly known as: MIRAPEX  Take 0.5 mg by mouth in the morning, at noon, and at bedtime. The timing of this medication is  very important.   acetaminophen  325 MG tablet Commonly known as: TYLENOL  Take 2 tablets (650 mg total) by mouth every 6 (six) hours as needed for mild pain (pain score 1-3) or fever (or Fever >/= 101).   albuterol  108 (90 Base) MCG/ACT inhaler Commonly known as: VENTOLIN  HFA Inhale 1 puff into the lungs every 6 (six) hours as needed for wheezing or shortness of breath.   azithromycin  250 MG tablet Commonly known as: ZITHROMAX  Take 2 tablets (500 mg total) by mouth daily.   cefadroxil  500 MG capsule Commonly known as: DURICEF Take 2 capsules (1,000 mg total) by mouth 2 (two) times daily for 2 days.    cholecalciferol  25 MCG (1000 UNIT) tablet Commonly known as: VITAMIN D3 Take 1,000 Units by mouth daily.   clonazePAM  1 MG tablet Commonly known as: KLONOPIN  Take 1 tablet (1 mg total) by mouth 2 (two) times daily as needed. for anxiety What changed:  reasons to take this additional instructions   cyanocobalamin  1000 MCG tablet Commonly known as: VITAMIN B12 Take 1 tablet (1,000 mcg total) by mouth daily.   diclofenac  Sodium 1 % Gel Commonly known as: Voltaren  Apply 4 g topically 4 (four) times daily. What changed:  when to take this additional instructions Another medication with the same name was removed. Continue taking this medication, and follow the directions you see here.   Dulcolax 5 MG EC tablet Generic drug: bisacodyl  Take 5 mg by mouth daily as needed for mild constipation or moderate constipation.   famotidine  40 MG tablet Commonly known as: PEPCID  Take 40 mg by mouth at bedtime.   feeding supplement Liqd Take 237 mLs by mouth 2 (two) times daily between meals.   fluticasone  50 MCG/ACT nasal spray Commonly known as: FLONASE  Place 1 spray into both nostrils 2 (two) times daily as needed for allergies or rhinitis.   lidocaine  5 % Commonly known as: Lidoderm  Place 1 patch onto the skin daily. Remove & Discard patch within 12 hours or as directed by MD What changed:  when to take this additional instructions   methocarbamol  500 MG tablet Commonly known as: ROBAXIN  Take 1 tablet (500 mg total) by mouth every 6 (six) hours as needed for muscle spasms.   metolazone  2.5 MG tablet Commonly known as: ZAROXOLYN  Take 2.5 mg by mouth daily as needed (for swelling of the legs- as directed).   metoprolol  succinate 25 MG 24 hr tablet Commonly known as: TOPROL -XL Take 1 tablet (25 mg total) by mouth daily.   Mounjaro 15 MG/0.5ML Pen Generic drug: tirzepatide Inject 15 mg into the skin every Thursday.   Narcan  4 MG/0.1ML Liqd nasal spray kit Generic drug:  naloxone  Place 0.4 mg into the nose daily as needed (opioid overdose).   nystatin  powder Commonly known as: MYCOSTATIN /NYSTOP  Apply 1 Application topically See admin instructions. Apply to irritated area 2-3 times a day   ondansetron  4 MG disintegrating tablet Commonly known as: ZOFRAN -ODT Take 4 mg by mouth every 8 (eight) hours as needed for nausea or vomiting (DISSOLVE ORALLY).   Oxycodone  HCl 10 MG Tabs Take 1 tablet (10 mg total) by mouth every 4 (four) hours as needed. What changed:  when to take this reasons to take this   pantoprazole  40 MG tablet Commonly known as: PROTONIX  TAKE ONE TABLET BY MOUTH DAILY What changed: when to take this   polyethylene glycol 17 g packet Commonly known as: MIRALAX  / GLYCOLAX  Take 17 g by mouth 2 (two) times daily. What changed:  when to take this   potassium chloride  SA 20 MEQ tablet Commonly known as: KLOR-CON  M Take 40 mEq by mouth in the morning.   senna-docusate 8.6-50 MG tablet Commonly known as: Senokot-S Take 2 tablets by mouth 2 (two) times daily.   spironolactone  25 MG tablet Commonly known as: ALDACTONE  Take 25 mg by mouth daily.   Torsemide  40 MG Tabs Take 40 mg by mouth 2 (two) times daily. What changed:  medication strength how much to take when to take this additional instructions   traZODone  50 MG tablet Commonly known as: DESYREL  Take 0.5 tablets (25 mg total) by mouth at bedtime as needed for up to 20 days for sleep.         Contact information for after-discharge care     Destination     [facility] .   Service: Skilled Nursing Contact information: [address] [place]  [address] [phone]                    Discharge Exam: Fredricka Weights   11/26/24 1842  Weight: (!) 149 kg   Vitals:   11/29/24 2015 11/30/24 0600  BP: (!) 121/55 126/74  Pulse: 70 71  Resp: 18 18  Temp: 98.2 F (36.8 C) 97.9 F (36.6 C)  SpO2: 98% 94%   Examination: Physical  Exam:  Constitutional: WN/WD morbidly obese Caucasian female in no acute distress Respiratory: Diminished to auscultation bilaterally with slightly coarse breath sounds, no wheezing, rales, rhonchi or crackles. Normal respiratory effort and patient is not tachypenic. No accessory muscle use.  Unlabored breathing and not wearing any supplemental oxygen nasal cannula Cardiovascular: RRR, no murmurs / rubs / gallops. S1 and S2 auscultated.  1+ lower extremity edema Abdomen: Soft, non-tender, tenderness secondary to body habitus. Bowel sounds positive.  GU: Deferred. Musculoskeletal: No clubbing / cyanosis of digits/nails. No joint deformity upper and lower extremities.  Skin: No rashes, lesions, ulcers on limited skin evaluation. No induration; Warm and dry.  Neurologic: CN 2-12 grossly intact with no focal deficits. Romberg sign and cerebellar reflexes not assessed.  Psychiatric: Normal judgment and insight. Alert and oriented x 3. Normal mood and appropriate affect.   Condition at discharge: stable  The results of significant diagnostics from this hospitalization (including imaging, microbiology, ancillary and laboratory) are listed below for reference.   Imaging Studies: DG Knee 1-2 Views Left Result Date: 11/29/2024 EXAM: 2 VIEW(S) XRAY OF THE LEFT KNEE 11/29/2024 03:26:00 PM COMPARISON: 10/28/2024 CLINICAL HISTORY: Pain FINDINGS: BONES AND JOINTS: No acute fracture. No malalignment. Moderate tricompartmental degenerative changes most pronounced in the medial compartment. SOFT TISSUES: The soft tissues are unremarkable. IMPRESSION: 1. Moderate tricompartmental osteoarthritis. Electronically signed by: Pinkie Pebbles MD 11/29/2024 07:30 PM EST RP Workstation: [id number]   CT ABDOMEN PELVIS W CONTRAST Result Date: 11/26/2024 CLINICAL DATA:  Abdominal pain, acute nonlocalized. Flank pain, stone suspected. Chronic low back pain. EXAM: CT ABDOMEN AND PELVIS WITH CONTRAST TECHNIQUE: Multidetector  CT imaging of the abdomen and pelvis was performed using the standard protocol following bolus administration of intravenous contrast. RADIATION DOSE REDUCTION: This exam was performed according to the departmental dose-optimization program which includes automated exposure control, adjustment of the mA and/or kV according to patient size and/or use of iterative reconstruction technique. CONTRAST:  OMNIPAQUE  IOHEXOL  300 MG/ML  SOLN COMPARISON:  04/28/2024. FINDINGS: Lower chest: The heart is enlarged and coronary artery calcifications are noted. Atelectasis is present at the lung bases. Hepatobiliary: No focal liver abnormality is  seen. No gallstones, gallbladder wall thickening, or biliary dilatation. Pancreas: Unremarkable. No pancreatic ductal dilatation or surrounding inflammatory changes. Spleen: Normal in size without focal abnormality. Adrenals/Urinary Tract: The adrenal glands are within normal limits. The kidneys enhance symmetrically. No renal calculus or obstructive uropathy is seen bilaterally. The bladder is unremarkable. Stomach/Bowel: The stomach is within normal limits. No bowel obstruction, free air, or pneumatosis is seen. Appendix appears normal. A moderate amount of retained stool is present in the [place]. Vascular/Lymphatic: Aortic atherosclerosis. There is a stable nonspecific enlarged lymph node at the porta hepatis measuring 1.1 cm. Remaining abdominopelvic lymph nodes are normal by size criteria. Reproductive: Status post hysterectomy. No adnexal masses. Other: No abdominopelvic ascites. A fat containing umbilical hernia is noted. Musculoskeletal: Degenerative changes are present in the thoracolumbar spine. No acute osseous abnormality is seen. IMPRESSION: 1. No acute intra-abdominal process. 2. Moderate amount of retained stool in the [place] suggesting constipation. 3. Aortic atherosclerosis and coronary artery calcifications. Electronically Signed   By: Leita Birmingham M.D.   On:  11/26/2024 14:28   CT Lumbar Spine Wo Contrast Result Date: 11/26/2024 EXAM: CT OF THE LUMBAR SPINE WITHOUT CONTRAST 11/26/2024 11:10:09 AM TECHNIQUE: CT of the lumbar spine was performed without the administration of intravenous contrast. Multiplanar reformatted images are provided for review. Automated exposure control, iterative reconstruction, and/or weight based adjustment of the mA/kV was utilized to reduce the radiation dose to as low as reasonably achievable. COMPARISON: MR lumbar spine without contrast 09/02/2024 and CT lumbar spine without contrast 1925. CLINICAL HISTORY: acute low back pain FINDINGS: BONES AND ALIGNMENT: Normal vertebral body heights. Transitional L5 segment is noted. Grade 1 degenerative anterolisthesis is present at L3-L4 and L4-L5. No acute fracture or suspicious bone lesion. Normal alignment. DEGENERATIVE CHANGES: * **L1-L2:** Moderate facet hypertrophy is present without focal disc protrusion or stenosis. * **L2-L3:** A far left lateral disc protrusion is again noted and stable moderate left greater than right foraminal stenosis. * **L3-L4:** Uncovertebral broad-based disc protrusion and advanced facet hypertrophy results in severe right central canal stenosis. Moderate-to-severe right and moderate left foraminal stenosis is stable. * **L4-L5:** A broad-based disc protrusion and advanced facet hypertrophy is present with moderate central canal stenosis. Left greater than right subarticular urine is present. Moderate foraminal stenosis is worse on the left. * **L5-S1:** Facet spurring is present without focal disc protrusion or stenosis. SOFT TISSUES: Atherosclerotic calcifications are present in the aorta without aneurysm. No acute abnormality. IMPRESSION: 1. 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 Stable moderate-to-severe right and moderate left foraminal stenosis. 2. Moderate central canal stenosis at L4-5  due to broad-based disc protrusion and advanced facet hypertrophy. Left greater than right subarticular recess narrowing is present. Moderate foraminal stenosis is worse on the left. 3. Stable far left lateral disc protrusion at L2-3 with moderate left greater than right foraminal stenosis. Electronically signed by: Lonni Necessary MD 11/26/2024 11:28 AM EST RP Workstation: [id number]   CT Soft Tissue Neck W Contrast Result Date: 11/21/2024 CLINICAL DATA:  Left parotid region mass EXAM: CT NECK WITH CONTRAST TECHNIQUE: Multidetector CT imaging of the neck was performed using the standard protocol following the bolus administration of intravenous contrast. RADIATION DOSE REDUCTION: This exam was performed according to the departmental dose-optimization program which includes automated exposure control, adjustment of the mA and/or kV according to patient size and/or use of iterative reconstruction technique. CONTRAST:  75mL OMNIPAQUE  IOHEXOL  300 MG/ML  SOLN COMPARISON:  June 19, 2020 FINDINGS: Pharynx: The nasopharynx, oropharynx and hypopharynx are normal Oral cavity/floor of mouth: Normal Larynx: Normal Salivary glands: The submandibular glands and right parotid glands are normal. There is a 7 mm round, well-circumscribed nodular lesion on the superior aspect of the superficial left parotid gland. Small intraparotid nodules were noted in this region on the prior study, most likely lymph nodes. Thyroid : Normal Lymph nodes: No adenopathy Vascular: No significant abnormality Limited intracranial: No significant abnormality Visualized orbits: No significant abnormality Mastoids and visualized paranasal sinuses: No significant abnormality Skeleton: No significant abnormality Upper chest: No significant abnormality Other: None IMPRESSION: 7 mm well-circumscribed nodular lesion on the superior aspect of the left parotid gland. Small intraparotid nodules were noted in this region on the prior study from May 23, 2020.  This is most likely an intraparotid lymph node. The differential includes a small benign parotid tumor. Electronically Signed   By: Nancyann Burns M.D.   On: 11/21/2024 10:08   Microbiology: Results for orders placed or performed during the hospital encounter of 11/26/24  Urine Culture (for pregnant, neutropenic or urologic patients or patients with an indwelling urinary catheter)     Status: Abnormal   Collection Time: 11/26/24  3:13 PM   Specimen: Urine, Clean Catch  Result Value Ref Range Status   Specimen Description   Final    URINE, CLEAN CATCH Performed at [facility], [address], [place], [place] [address]    Special Requests   Final    NONE Performed at [facility], [address], [place], [place] [address]    Culture MULTIPLE SPECIES PRESENT, SUGGEST RECOLLECTION (A)  Final   Report Status 11/27/2024 FINAL  Final   Labs: CBC: Recent Labs  Lab 11/26/24 1134 11/27/24 0414 11/28/24 0432 11/29/24 0442  WBC 11.7* 6.4 6.1 6.8  NEUTROABS 8.1*  --   --   --   HGB 11.4* 11.1* 10.9* 11.2*  HCT 35.2* 35.0* 34.1* 34.7*  MCV 91.9 93.8 92.9 92.3  PLT 246 211 227 234   Basic Metabolic Panel: Recent Labs  Lab 11/26/24 1049 11/27/24 0414 11/28/24 0432 11/29/24 0442  NA 138 142 142 140  K 4.3 4.0 4.4 4.7  CL 102 106 105 99  CO2 27 27 30  35*  GLUCOSE 85 78 95 88  BUN 13 10 9 14   CREATININE 1.03* 0.91 0.87 1.01*  CALCIUM  9.6 8.8* 8.8* 9.0   Liver Function Tests: No results for input(s): AST, ALT, ALKPHOS, BILITOT, PROT, ALBUMIN  in the last 168 hours. CBG: Recent Labs  Lab 11/29/24 0731 11/29/24 1212 11/29/24 1710 11/29/24 2114 11/30/24 0756  GLUCAP 86 104* 111* 87 96   Discharge time spent: greater than 30 minutes.  Signed: Alejandro Marker, DO [facility] 11/30/2024

## 2024-11-30 NOTE — Telephone Encounter (Signed)
 I spoke with patient to get her scheduled for a follow up appointment after her US  Thyroid  Bx being done on 12/04/24. Patient stated that she is in the hospital right now. I asked that she call the office and schedule a follow up appointment when she gets her Bx done. Patient stated that she understood.

## 2024-11-30 NOTE — Plan of Care (Signed)

## 2024-11-30 NOTE — TOC Transition Note (Signed)
 Transition of Care [facility]) - Discharge Note   Patient Details  Name: Maria [name] MRN: [id number] Date of Birth: Jan 26, 1956  Transition of Care [facility]) CM/SW Contact:  Doneta Glenys DASEN, RN Phone Number: 11/30/2024, 10:08 AM   Clinical Narrative:     Per MD patient ready for DC to [facility]. RN to call report prior to discharge [name] [phone] Rm [phone] ). RN, patient, patient's family, and facility notified of DC. Discharge Summary and FL2 sent to facility via HUB. DC packet on chart includes face sheet, medical necessity, discharge summary. Ambulance PTAR transport requested for patient.  IP CM signing off.Please consult us  again if new needs arise.     Final next level of care: Skilled Nursing Facility [facility]) Barriers to Discharge: Barriers Resolved   Patient Goals and CMS Choice Patient states their goals for this hospitalization and ongoing recovery are:: [employer]gov Compare Post Acute Care list provided to:: Patient Choice offered to / list presented to : Patient [name] ownership interest in [facility].provided to:: Patient    Discharge Placement   Existing PASRR number confirmed : 11/30/24          Patient chooses bed at: Other - please specify in the comment section below: [name]) Patient to be transferred to facility by: PTAR Name of family member notified: Patient will handle Patient and family notified of of transfer: 11/30/24  Discharge Plan and Services Additional resources added to the After Visit Summary for   In-house Referral: NA Discharge Planning Services: CM Consult            DME Arranged: N/A DME Agency: NA       HH Arranged: NA HH Agency: NA        Social Drivers of Health (SDOH) Interventions SDOH Screenings   Food Insecurity: No Food Insecurity (11/26/2024)  Housing: Low Risk (11/26/2024)  Transportation Needs: No Transportation Needs (11/26/2024)  Utilities: Not At Risk  (11/26/2024)  Alcohol  Screen: Low Risk (12/30/2021)  Depression (PHQ2-9): Low Risk (11/14/2024)  Financial Resource Strain: Medium Risk (12/30/2021)  Physical Activity: Inactive (12/30/2021)  Social Connections: Socially Isolated (11/26/2024)  Stress: No Stress Concern Present (12/30/2021)  Tobacco Use: Medium Risk (11/26/2024)     Readmission Risk Interventions    11/27/2024    3:08 PM 09/04/2024    4:45 PM 05/03/2024   10:25 AM  Readmission Risk Prevention Plan  Transportation Screening Complete Complete Complete  Medication Review Oceanographer) Complete Complete Complete  PCP or Specialist appointment within 3-5 days of discharge Complete Complete Complete  HRI or Home Care Consult Complete Complete Complete  SW Recovery Care/Counseling Consult Complete Complete Complete  Palliative Care Screening Not Applicable Not Applicable Not Applicable  Skilled Nursing Facility Complete Complete Not Applicable

## 2024-11-30 NOTE — Care Management Important Message (Signed)
 Important Message  Patient Details  Name: Maria [name] MRN: [id number] Date of Birth: [birth year]   Important Message Given:  Yes - Medicare IM (reviewed telephonically at [phone])     [name] 11/30/2024, 10:07 AM

## 2024-12-04 ENCOUNTER — Ambulatory Visit (HOSPITAL_COMMUNITY): Admission: RE | Admit: 2024-12-04 | Discharge: 2024-12-04

## 2024-12-04 DIAGNOSIS — K118 Other diseases of salivary glands: Secondary | ICD-10-CM

## 2024-12-04 MED ORDER — LIDOCAINE HCL (PF) 1 % IJ SOLN
10.0000 mL | Freq: Once | INTRAMUSCULAR | Status: AC
  Administered 2024-12-04: 14:00:00 10 mL

## 2024-12-04 NOTE — Procedures (Signed)
 Pre procedural Dx: Left parotid nodule  Post procedural Dx: Same  Technically successful US  guided FNA of left parotid   EBL: None.   Complications: None immediate.   KANDICE Banner, MD Pager #: [phone]

## 2024-12-05 LAB — CYTOLOGY - NON PAP

## 2024-12-24 NOTE — Progress Notes (Unsigned)
 " Cardiology Office Note:    Date:  12/26/2024   ID:  Maria [name], DOB 1956/01/04, MRN [id number]  PCP:  Ileen [name], NP   [facility] Providers Cardiologist:  Lynwood Schilling, MD     Referring MD: Ileen [name], NP   Chief Complaint  Patient presents with   Follow-up    CHF   Hospitalization Follow-up    History of Present Illness:    Maria [name] is a 69 y.o. female with a hx of chronic diastolic heart failure, morbid obesity, DM2, hypertension, hyperlipidemia, GERD, anxiety/depression, and DM2.  She has a history of hospitalizations for heart failure exacerbations, most recently May 2025.  She was hospitalized 08/2024 with lower extremity weakness.  Cardiology was asked to evaluate prior to cervical stenosis surgery.  Cardiology followed for diuresis following surgery.  Initially torsemide  and spironolactone  were held, resumed at discharge.  Unfortunately she was seen back in the ER 10/24/2024 with peripheral edema (DVT study negative), 10/26/2024 after a fall (significant LE edema, walks with walker), 10/28/2024 with left hip pain (recommended for SNF placement, she was D/C'ed home), and was hospitalized 11/2024 with pyelonephritis.  She presents today for cardiology follow-up. She remains at [facility]. Issues with rehab due to right knee OA. She is on 40 mg torsemide , 40 mEq potassium, and 25 mg spironolactone . I do not have recent labs, unclear how long she has been on this regimen, she states the facility has been changing her potassium.  Breathing OK, but she states she has bronchitis with cough. She is using inhalers and IS.  Overall doing ok at rehab.   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 2010   CERVICAL DISC SURGERY  may 24th 2010   COLONOSCOPY WITH PROPOFOL  N/A 07/10/2016   Procedure: COLONOSCOPY WITH PROPOFOL ;  Surgeon: Elspeth Deward Naval, MD;  Location: [facility];  Service: Gastroenterology;  Laterality: N/A;   COLONOSCOPY WITH PROPOFOL  N/A 09/26/2019   Procedure: COLONOSCOPY WITH PROPOFOL ;  Surgeon: Naval Elspeth SQUIBB, MD;  Location: [facility];  Service: Gastroenterology;  Laterality: N/A;   ESOPHAGOGASTRODUODENOSCOPY (EGD) WITH PROPOFOL  N/A 07/10/2016   Procedure: ESOPHAGOGASTRODUODENOSCOPY (EGD) WITH PROPOFOL ;  Surgeon: Elspeth Deward Naval, MD;  Location: [facility];  Service: Gastroenterology;  Laterality: N/A;   POLYPECTOMY  09/26/2019   Procedure: POLYPECTOMY;  Surgeon: Naval Elspeth SQUIBB, MD;  Location: [facility];  Service: Gastroenterology;;   RADIOLOGY WITH  ANESTHESIA N/A 07/30/2023   Procedure: MRI WITH ANESTHESIA - THORACIC LUMBAR WITHOUT CONSTRAST;  Surgeon: Radiologist, Medication, MD;  Location: [facility] OR;  Service: Radiology;  Laterality: N/A;   s/p ovary cyst     s/p right knee arthroscopy     Dr. Josefina ortho    Current  Medications: Active Medications[1]   Allergies:   Adhesive [tape], Amoxicillin -pot clavulanate, Codeine, Crestor  [rosuvastatin  calcium ], Cyclobenzaprine , Cymbalta  [duloxetine  hcl], Gabapentin , Lexapro [escitalopram], Lyrica [pregabalin], Vicodin [hydrocodone-acetaminophen ], Wellbutrin [bupropion], and Latex   Social History   Socioeconomic History   Marital status: Divorced    Spouse name: Not on file   Number of children: 2   Years of education: 14   Highest education level: Not on file  Occupational History   Occupation: disabled  Tobacco Use   Smoking status: Former    Current packs/day: 0.00    Types: Cigarettes    Start date: 11/08/1978    Quit date: 11/08/2008    Years since quitting: 16.1   Smokeless tobacco: Never   Tobacco comments:    quit 10/09  Vaping Use   Vaping status: Never Used  Substance and Sexual Activity   Alcohol  use: No    Comment: quit drinking 10/07/1997   Drug use: No   Sexual activity: Not Currently    Birth control/protection: Surgical, Abstinence  Other Topics Concern   Not on file  Social History Narrative   Patient has difficulty financially affording food, medication, and affording essential bills. She states she has limited support from her daughter.    Right handed   One floor apartment   Drinks caffeine prn   Lives alone      Social Drivers of Health   Tobacco Use: Medium Risk (12/26/2024)   Patient History    Smoking Tobacco Use: Former    Smokeless Tobacco Use: Never    Passive Exposure: Not on file  Financial Resource Strain: Medium Risk (12/30/2021)   Overall Financial Resource Strain (CARDIA)    Difficulty of Paying Living Expenses: Somewhat hard  Food Insecurity: No Food Insecurity (11/26/2024)   Epic    Worried About Programme Researcher, Broadcasting/film/video in the Last Year: Never true    Ran Out of Food in the Last Year: Never true  Transportation Needs: No Transportation Needs (11/26/2024)   Epic    Lack of Transportation (Medical): No     Lack of Transportation (Non-Medical): No  Physical Activity: Inactive (12/30/2021)   Exercise Vital Sign    Days of Exercise per Week: 0 days    Minutes of Exercise per Session: 0 min  Stress: No Stress Concern Present (12/30/2021)   [employer] of Occupational Health - Occupational Stress Questionnaire    Feeling of Stress : Not at all  Social Connections: Socially Isolated (11/26/2024)   Social Connection and Isolation Panel    Frequency of Communication with Friends and Family: More than three times a week    Frequency of Social Gatherings with Friends and Family: Once a week    Attends Religious Services: Never    Database Administrator or Organizations: No    Attends Banker Meetings: Never    Marital Status: Divorced  Depression (PHQ2-9): Low Risk (11/14/2024)   Depression (PHQ2-9)    PHQ-2 Score: 2  Alcohol  Screen: Low Risk (12/30/2021)   Alcohol  Screen    Last Alcohol  Screening Score (AUDIT): 0  Housing: Low Risk (11/26/2024)   Epic    Unable to Pay for Housing in the  Last Year: No    Number of Times Moved in the Last Year: 0    Homeless in the Last Year: No  Utilities: Not At Risk (11/26/2024)   Epic    Threatened with loss of utilities: No  Health Literacy: Not on file     Family History: The patient's family history includes Anxiety disorder in her sister; Asthma in her daughter and sister; Bipolar disorder in her daughter; Cancer in her maternal uncle and another family member; Depression in her sister; Diabetes in her mother; Heart disease in her mother; Heart failure in her mother; Hypertension in her father and mother; Liver cancer in her paternal grandmother.  ROS:   Please see the history of present illness.     All other systems reviewed and are negative.  EKGs/Labs/Other Studies Reviewed:    The following studies were reviewed today:       Recent Labs: 04/27/2024: B Natriuretic Peptide 52.8 07/07/2024: TSH 2.32 10/24/2024: ALT  14 10/26/2024: Pro Brain Natriuretic Peptide 96.9 10/28/2024: Magnesium  1.9 11/29/2024: BUN 14; Creatinine, Ser 1.01; Hemoglobin 11.2; Platelets 234; Potassium 4.7; Sodium 140  Recent Lipid Panel    Component Value Date/Time   CHOL 148 02/02/2023 0738   TRIG 100 02/02/2023 0738   TRIG 126 12/31/2006 0847   HDL 41 02/02/2023 0738   CHOLHDL 3.6 02/02/2023 0738   VLDL 20 02/02/2023 0738   LDLCALC 87 02/02/2023 0738   LDLDIRECT 127.0 01/25/2019 1423     Risk Assessment/Calculations:                Physical Exam:    VS:  BP 132/60 (Cuff Size: Large)   Pulse 60   Ht 5' 3 (1.6 m)   Wt (!) 305 lb (138.3 kg)   SpO2 97%   BMI 54.03 kg/m     Wt Readings from Last 3 Encounters:  12/26/24 (!) 305 lb (138.3 kg)  11/26/24 (!) 328 lb 7.8 oz (149 kg)  11/14/24 291 lb 1.6 oz (132 kg)     GEN: obese female in wheelchair, NAD HEENT: Normal NECK: No JVD; No carotid bruits LYMPHATICS: No lymphadenopathy CARDIAC: RRR, RSB 2/6 murmur RESPIRATORY:  Clear to auscultation without rales, wheezing or rhonchi  ABDOMEN: Soft, non-tender, non-distended MUSCULOSKELETAL:  B LE edema, compression stockings in place  SKIN: Warm and dry NEUROLOGIC:  Alert and oriented x 3 PSYCHIATRIC:  Normal affect   ASSESSMENT:    1. Cardiac murmur   2. Medication management   3. Chronic diastolic HF (heart failure) (HCC)   4. Primary hypertension   5. Hyperlipidemia with target LDL less than 70   6. Morbid obesity (HCC)   7. Physical deconditioning    PLAN:    In order of problems listed above:  Chronic diastolic heart failure -Continue 25 mg spironolactone , 40 mg torsemide  twice daily, 25 mg Toprol  - Recent pyelonephritis, no SGLT2i -- taking 40 mg torsemide , spironolactone  and 40 mEq potassium - BMP today   Cardiac murmur on exam - will recheck an echo   Hypertension - Managed in the context of HFpEF as above   Hyperlipidemia with LDL goal less than 70 -- On no medications - Followed  by PCP   Deconditioned Morbid obesity - ambulates with walker at baseline, now mostly in a wheelchair   Follow up with Dr. Lavona in 4-6 weeks.       Medication Adjustments/Labs and Tests Ordered: Current medicines are reviewed at length with the patient today.  Concerns regarding medicines  are outlined above.  Orders Placed This Encounter  Procedures   Basic metabolic panel with GFR   ECHOCARDIOGRAM COMPLETE   No orders of the defined types were placed in this encounter.   Patient Instructions  Medication Instructions:  Your physician recommends that you continue on your current medications as directed. Please refer to the Current Medication list given to you today.  *If you need a refill on your cardiac medications before your next appointment, please call your pharmacy*  Lab Work: BMET today  Testing/Procedures: Your physician has requested that you have an echocardiogram. Echocardiography is a painless test that uses sound waves to create images of your heart. It provides your doctor with information about the size and shape of your heart and how well your hearts chambers and valves are working. This procedure takes approximately one hour. There are no restrictions for this procedure. Please do NOT wear cologne, perfume, aftershave, or lotions (deodorant is allowed). Please arrive 15 minutes prior to your appointment time.  Please note: We ask at that you not bring children with you during ultrasound (echo/ vascular) testing. Due to room size and safety concerns, children are not allowed in the ultrasound rooms during exams. Our front office staff cannot provide observation of children in our lobby area while testing is being conducted. An adult accompanying a patient to their appointment will only be allowed in the ultrasound room at the discretion of the ultrasound technician under special circumstances. We apologize for any inconvenience.   Follow-Up: At [facility], you and your health needs are our priority.  As part of our continuing mission to provide you with exceptional heart care, our providers are all part of one team.  This team includes your primary Cardiologist (physician) and Advanced Practice Providers or APPs (Physician Assistants and Nurse Practitioners) who all work together to provide you with the care you need, when you need it.  Your next appointment:   4-6 weeks  Provider:   Lynwood Schilling, MD  or any APP  We recommend signing up for the patient portal called MyChart.  Sign up information is provided on this After Visit Summary.  MyChart is used to connect with patients for Virtual Visits (Telemedicine).  Patients are able to view lab/test results, encounter notes, upcoming appointments, etc.  Non-urgent messages can be sent to your provider as well.   To learn more about what you can do with MyChart, go to [email or web address].              Signed, Jon Nat Hails, PA  12/26/2024 10:15 AM    [facility]     [1]  Current Meds  Medication Sig   acetaminophen  (TYLENOL ) 325 MG tablet Take 2 tablets (650 mg total) by mouth every 6 (six) hours as needed for mild pain (pain score 1-3) or fever (or Fever >/= 101).   albuterol  (VENTOLIN  HFA) 108 (90 Base) MCG/ACT inhaler Inhale 1 puff into the lungs every 6 (six) hours as needed for wheezing or shortness of breath.   cholecalciferol  (VITAMIN D3) 25 MCG (1000 UNIT) tablet Take 1,000 Units by mouth daily.   clonazePAM  (KLONOPIN ) 1 MG tablet Take 1 tablet (1 mg total) by mouth 2 (two) times daily as needed. for anxiety   cyanocobalamin  1000 MCG tablet Take 1 tablet (1,000 mcg total) by mouth daily.   diclofenac  Sodium (VOLTAREN ) 1 % GEL Apply 4 g topically 4 (four) times daily.   DULCOLAX 5 MG EC tablet Take 5  mg by mouth daily as needed for mild constipation or moderate constipation.   fluticasone  (FLONASE ) 50 MCG/ACT nasal spray Place 1 spray into both  nostrils 2 (two) times daily as needed for allergies or rhinitis.   lidocaine  (LIDODERM ) 5 % Place 1 patch onto the skin daily. Remove & Discard patch within 12 hours or as directed by MD   methocarbamol  (ROBAXIN ) 500 MG tablet Take 1 tablet (500 mg total) by mouth every 6 (six) hours as needed for muscle spasms.   metolazone  (ZAROXOLYN ) 2.5 MG tablet Take 2.5 mg by mouth daily as needed (for swelling of the legs- as directed).   metoprolol  succinate (TOPROL -XL) 25 MG 24 hr tablet Take 1 tablet (25 mg total) by mouth daily.   NARCAN  4 MG/0.1ML LIQD nasal spray kit Place 0.4 mg into the nose daily as needed (opioid overdose).   ondansetron  (ZOFRAN -ODT) 4 MG disintegrating tablet Take 4 mg by mouth every 8 (eight) hours as needed for nausea or vomiting (DISSOLVE ORALLY).   Oxycodone  HCl 10 MG TABS Take 1 tablet (10 mg total) by mouth every 4 (four) hours as needed.   pantoprazole  (PROTONIX ) 40 MG tablet TAKE ONE TABLET BY MOUTH DAILY   polyethylene glycol (MIRALAX  / GLYCOLAX ) 17 g packet Take 17 g by mouth 2 (two) times daily.   potassium chloride  SA (KLOR-CON  M) 20 MEQ tablet Take 40 mEq by mouth in the morning.   pramipexole  (MIRAPEX ) 0.5 MG tablet Take 0.5 mg by mouth in the morning, at noon, and at bedtime.   senna-docusate (SENOKOT-S) 8.6-50 MG tablet Take 2 tablets by mouth 2 (two) times daily.   spironolactone  (ALDACTONE ) 25 MG tablet Take 25 mg by mouth daily.   tirzepatide (MOUNJARO) 15 MG/0.5ML Pen Inject 15 mg into the skin every Thursday.   torsemide  40 MG TABS Take 40 mg by mouth 2 (two) times daily.   "

## 2024-12-26 ENCOUNTER — Encounter: Payer: Self-pay | Admitting: Physician Assistant

## 2024-12-26 ENCOUNTER — Ambulatory Visit: Attending: Physician Assistant | Admitting: Physician Assistant

## 2024-12-26 VITALS — BP 132/60 | HR 60 | Ht 63.0 in | Wt 305.0 lb

## 2024-12-26 DIAGNOSIS — R011 Cardiac murmur, unspecified: Secondary | ICD-10-CM

## 2024-12-26 DIAGNOSIS — E785 Hyperlipidemia, unspecified: Secondary | ICD-10-CM | POA: Diagnosis not present

## 2024-12-26 DIAGNOSIS — I5032 Chronic diastolic (congestive) heart failure: Secondary | ICD-10-CM | POA: Diagnosis not present

## 2024-12-26 DIAGNOSIS — I10 Essential (primary) hypertension: Secondary | ICD-10-CM

## 2024-12-26 DIAGNOSIS — R5381 Other malaise: Secondary | ICD-10-CM

## 2024-12-26 DIAGNOSIS — Z79899 Other long term (current) drug therapy: Secondary | ICD-10-CM | POA: Diagnosis not present

## 2024-12-26 NOTE — Patient Instructions (Addendum)
 Medication Instructions:  Your physician recommends that you continue on your current medications as directed. Please refer to the Current Medication list given to you today.  *If you need a refill on your cardiac medications before your next appointment, please call your pharmacy*  Lab Work: BMET today  Testing/Procedures: Your physician has requested that you have an echocardiogram. Echocardiography is a painless test that uses sound waves to create images of your heart. It provides your doctor with information about the size and shape of your heart and how well your hearts chambers and valves are working. This procedure takes approximately one hour. There are no restrictions for this procedure. Please do NOT wear cologne, perfume, aftershave, or lotions (deodorant is allowed). Please arrive 15 minutes prior to your appointment time.  Please note: We ask at that you not bring children with you during ultrasound (echo/ vascular) testing. Due to room size and safety concerns, children are not allowed in the ultrasound rooms during exams. Our front office staff cannot provide observation of children in our lobby area while testing is being conducted. An adult accompanying a patient to their appointment will only be allowed in the ultrasound room at the discretion of the ultrasound technician under special circumstances. We apologize for any inconvenience.   Follow-Up: At [facility], you and your health needs are our priority.  As part of our continuing mission to provide you with exceptional heart care, our providers are all part of one team.  This team includes your primary Cardiologist (physician) and Advanced Practice Providers or APPs (Physician Assistants and Nurse Practitioners) who all work together to provide you with the care you need, when you need it.  Your next appointment:   4-6 weeks  Provider:   Lynwood Schilling, MD  or any APP  We recommend signing up for the patient  portal called MyChart.  Sign up information is provided on this After Visit Summary.  MyChart is used to connect with patients for Virtual Visits (Telemedicine).  Patients are able to view lab/test results, encounter notes, upcoming appointments, etc.  Non-urgent messages can be sent to your provider as well.   To learn more about what you can do with MyChart, go to [email or web address].

## 2024-12-27 ENCOUNTER — Ambulatory Visit: Payer: Self-pay | Admitting: Physician Assistant

## 2024-12-27 ENCOUNTER — Ambulatory Visit (INDEPENDENT_AMBULATORY_CARE_PROVIDER_SITE_OTHER)

## 2024-12-27 LAB — BASIC METABOLIC PANEL WITH GFR
BUN/Creatinine Ratio: 11 — ABNORMAL LOW (ref 12–28)
BUN: 12 mg/dL (ref 8–27)
CO2: 22 mmol/L (ref 20–29)
Calcium: 9 mg/dL (ref 8.7–10.3)
Chloride: 104 mmol/L (ref 96–106)
Creatinine, Ser: 1.06 mg/dL — ABNORMAL HIGH (ref 0.57–1.00)
Glucose: 85 mg/dL (ref 70–99)
Potassium: 4.8 mmol/L (ref 3.5–5.2)
Sodium: 140 mmol/L (ref 134–144)
eGFR: 58 mL/min/1.73 — ABNORMAL LOW

## 2024-12-27 NOTE — Telephone Encounter (Signed)
 Patient returned call regarding results.

## 2024-12-28 NOTE — Progress Notes (Signed)
 "  Chief Complaint: history of [place] polyps Primary GI MD: Dr. Leigh  HPI: 69 year old female history of chronic diastolic heart failure, morbid obesity (BMI 54), diabetes type 2, hypertension, anxiety/depression, GERD, presents for evaluation of history of [place] polyps  Multiple hospitalizations this year for heart failure exacerbations recently seen by cardiology 12/26/2024.  Due to multiple exacerbations and newfound heart murmur she has been scheduled for echocardiogram scheduled for 01/24/2025  Patient states she is here today for recall colonoscopy.  Currently no GI symptoms.  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  She is in a wheelchair today.  She would really like to hold off on colonoscopy until she loses weight and is more ambulatory   PREVIOUS GI WORKUP   Laryngoscopy 05/2020 for hoarseness - Normal  Colonoscopy 09/2019 - A single colonic angiodysplastic lesion.  - One diminutive polyp in the ascending Hartt, removed with a cold snare. Resected and retrieved.  - One 4 mm polyp in the transverse Stangler, removed with a cold snare. Resected and retrieved.  - One 3 mm polyp at the recto- sigmoid Henle, removed with a cold snare. Resected and retrieved.  - Diverticulosis in the sigmoid Mcelhinny.  - The examination was otherwise normal. - Biopsies tubular adenomas, recall 3 to 5 years  Colonoscopy 07/10/16 - 6 polyps removed, 1 cm cecal AVM, hemorrhoids, diverticulosis - path shows most polyps adenomatous - repeat in 3 years EGD 07/10/16 - normal exam, biopsies obtained, negative for H pylori  Past Medical History:  Diagnosis Date   Acute lymphadenitis 2011   ALLERGIC RHINITIS 08/10/2007   ANXIETY 12/06/2007   Atherosclerotic peripheral vascular disease 06/13/2013   Aorta on CT June 2014   Cellulitis 2011   Cervical disc disease 03/09/2012   CHF (congestive heart failure) (HCC)    Chronic pain  03/09/2012   DEPRESSION 12/06/2007   Diabetes (HCC)    DIVERTICULOSIS, Alig 12/06/2007   GERD 12/06/2007   Heart murmur    Hepatitis    age 70 hepatitis A   HLD (hyperlipidemia) 05/24/2019   HYPERTENSION 12/06/2007   Lumbar disc disease 03/09/2012   Mesenteric adenitis    MRSA 2006   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 2010   CERVICAL DISC SURGERY  may 24th 2010   COLONOSCOPY WITH PROPOFOL  N/A 07/10/2016   Procedure: COLONOSCOPY WITH PROPOFOL ;  Surgeon: Elspeth Deward Leigh, MD;  Location: [facility];  Service: Gastroenterology;  Laterality: N/A;   COLONOSCOPY WITH PROPOFOL  N/A 09/26/2019   Procedure: COLONOSCOPY WITH PROPOFOL ;  Surgeon: Leigh Elspeth SQUIBB, MD;  Location: [facility];  Service: Gastroenterology;  Laterality: N/A;   ESOPHAGOGASTRODUODENOSCOPY (EGD) WITH PROPOFOL  N/A 07/10/2016   Procedure: ESOPHAGOGASTRODUODENOSCOPY (EGD) WITH PROPOFOL ;  Surgeon: Elspeth Deward Leigh, MD;  Location: [facility];  Service: Gastroenterology;  Laterality: N/A;   POLYPECTOMY  09/26/2019   Procedure: POLYPECTOMY;  Surgeon: Leigh Elspeth SQUIBB, MD;  Location: [facility];  Service: Gastroenterology;;   RADIOLOGY WITH ANESTHESIA N/A 07/30/2023   Procedure: MRI WITH ANESTHESIA -  THORACIC LUMBAR WITHOUT CONSTRAST;  Surgeon: Radiologist, Medication, MD;  Location: [facility] OR;  Service: Radiology;  Laterality: N/A;   s/p ovary cyst     s/p right knee arthroscopy     Dr. Josefina ortho    Current  Outpatient Medications  Medication Sig Dispense Refill   acetaminophen  (TYLENOL ) 325 MG tablet Take 2 tablets (650 mg total) by mouth every 6 (six) hours as needed for mild pain (pain score 1-3) or fever (or Fever >/= 101).     albuterol  (VENTOLIN  HFA) 108 (90 Base) MCG/ACT inhaler Inhale 1 puff into the lungs every 6 (six) hours as needed for wheezing or shortness of breath.     cholecalciferol  (VITAMIN D3) 25 MCG (1000 UNIT) tablet Take 1,000 Units by mouth daily.     clonazePAM  (KLONOPIN ) 1 MG tablet Take 1 tablet (1 mg total) by mouth 2 (two) times daily as needed. for anxiety 10 tablet 0   cyanocobalamin  1000 MCG tablet Take 1 tablet (1,000 mcg total) by mouth daily. 30 tablet 0   diclofenac  Sodium (VOLTAREN ) 1 % GEL Apply 4 g topically 4 (four) times daily. 100 g 0   DULCOLAX 5 MG EC tablet Take 5 mg by mouth daily as needed for mild constipation or moderate constipation.     famotidine  (PEPCID ) 40 MG tablet Take 40 mg by mouth at bedtime.     fluticasone  (FLONASE ) 50 MCG/ACT nasal spray Place 1 spray into both nostrils 2 (two) times daily as needed for allergies or rhinitis.     lidocaine  4 % Place 1 patch onto the skin daily.     methocarbamol  (ROBAXIN ) 500 MG tablet Take 1 tablet (500 mg total) by mouth every 6 (six) hours as needed for muscle spasms. 20 tablet 0   metoprolol  succinate (TOPROL -XL) 25 MG 24 hr tablet Take 1 tablet (25 mg total) by mouth daily. 30 tablet 0   NARCAN  4 MG/0.1ML LIQD nasal spray kit Place 0.4 mg into the nose daily as needed (opioid overdose).     nystatin  (MYCOSTATIN /NYSTOP ) powder Apply 1 Application topically See admin instructions. Apply to irritated area 2-3 times a day     ondansetron  (ZOFRAN -ODT) 4 MG disintegrating tablet Take 4 mg by mouth every 8 (eight) hours as needed for nausea or vomiting (DISSOLVE ORALLY).     Oxycodone  HCl 10 MG TABS Take 1 tablet (10 mg total) by mouth every 4 (four) hours as needed. 10 tablet 0   pantoprazole  (PROTONIX ) 40 MG  tablet TAKE ONE TABLET BY MOUTH DAILY 90 tablet 2   polyethylene glycol (MIRALAX  / GLYCOLAX ) 17 g packet Take 17 g by mouth 2 (two) times daily. 14 each 0   potassium chloride  SA (KLOR-CON  M) 20 MEQ tablet Take 40 mEq by mouth in the morning.     pramipexole  (MIRAPEX ) 0.5 MG tablet Take 0.5 mg by mouth in the morning, at noon, and at bedtime.     senna-docusate (SENOKOT-S) 8.6-50 MG tablet Take 2 tablets by mouth 2 (two) times daily.     spironolactone  (ALDACTONE ) 25 MG tablet Take 25 mg by mouth daily.     tirzepatide (MOUNJARO) 15 MG/0.5ML Pen Inject 15 mg into the skin every Thursday.     torsemide  40 MG TABS Take 40 mg by mouth 2 (two) times daily.     metolazone  (ZAROXOLYN ) 2.5 MG tablet Take 2.5 mg by mouth daily as needed (for swelling of the legs- as directed).     traZODone  (DESYREL ) 50 MG tablet Take  0.5 tablets (25 mg total) by mouth at bedtime as needed for up to 20 days for sleep. (Patient not taking: Reported on 12/29/2024) 10 tablet 0   No current facility-administered medications for this visit.    Allergies as of 12/29/2024 - Review Complete 12/26/2024  Allergen Reaction Noted   Adhesive [tape] Other (See Comments) 08/19/2014   Amoxicillin -pot clavulanate Nausea And Vomiting and Other (See Comments)    Codeine Other (See Comments)    Crestor  [rosuvastatin  calcium ] Other (See Comments) 09/22/2019   Cyclobenzaprine  Other (See Comments) 01/31/2024   Cymbalta  [duloxetine  hcl] Other (See Comments) 01/31/2024   Gabapentin  Other (See Comments) 10/09/2023   Lexapro [escitalopram] Other (See Comments) 09/01/2024   Lyrica [pregabalin] Other (See Comments) 02/03/2023   Vicodin [hydrocodone-acetaminophen ] Other (See Comments) 05/23/2012   Wellbutrin [bupropion] Other (See Comments) 11/27/2024   Latex Rash     Family History  Problem Relation Age of Onset   Heart disease Mother    Hypertension Mother    Diabetes Mother    Heart failure Mother    Asthma Sister    Anxiety  disorder Sister    Depression Sister    Hypertension Father    Asthma Daughter    Bipolar disorder Daughter    Cancer Maternal Uncle        Batzel   Cancer Other        ovarian   Liver cancer Paternal Grandmother        ????    Social History   Socioeconomic History   Marital status: Divorced    Spouse name: Not on file   Number of children: 2   Years of education: 14   Highest education level: Not on file  Occupational History   Occupation: disabled  Tobacco Use   Smoking status: Former    Current packs/day: 0.00    Types: Cigarettes    Start date: 11/08/1978    Quit date: 11/08/2008    Years since quitting: 16.1   Smokeless tobacco: Never   Tobacco comments:    quit 10/09  Vaping Use   Vaping status: Never Used  Substance and Sexual Activity   Alcohol  use: No    Comment: quit drinking 10/07/1997   Drug use: No   Sexual activity: Not Currently    Birth control/protection: Surgical, Abstinence  Other Topics Concern   Not on file  Social History Narrative   Patient has difficulty financially affording food, medication, and affording essential bills. She states she has limited support from her daughter.    Right handed   One floor apartment   Drinks caffeine prn   Lives alone      Social Drivers of Health   Tobacco Use: Medium Risk (12/29/2024)   Patient History    Smoking Tobacco Use: Former    Smokeless Tobacco Use: Never    Passive Exposure: Not on file  Financial Resource Strain: Medium Risk (12/30/2021)   Overall Financial Resource Strain (CARDIA)    Difficulty of Paying Living Expenses: Somewhat hard  Food Insecurity: No Food Insecurity (11/26/2024)   Epic    Worried About Programme Researcher, Broadcasting/film/video in the Last Year: Never true    Ran Out of Food in the Last Year: Never true  Transportation Needs: No Transportation Needs (11/26/2024)   Epic    Lack of Transportation (Medical): No    Lack of Transportation (Non-Medical): No  Physical Activity: Inactive  (12/30/2021)   Exercise Vital Sign    Days of Exercise per Week:  0 days    Minutes of Exercise per Session: 0 min  Stress: No Stress Concern Present (12/30/2021)   [employer] of Occupational Health - Occupational Stress Questionnaire    Feeling of Stress : Not at all  Social Connections: Socially Isolated (11/26/2024)   Social Connection and Isolation Panel    Frequency of Communication with Friends and Family: More than three times a week    Frequency of Social Gatherings with Friends and Family: Once a week    Attends Religious Services: Never    Database Administrator or Organizations: No    Attends Banker Meetings: Never    Marital Status: Divorced  Catering Manager Violence: Unknown (11/26/2024)   Epic    Fear of Current or Ex-Partner: No    Emotionally Abused: Not on file    Physically Abused: No    Sexually Abused: No  Depression (PHQ2-9): Low Risk (11/14/2024)   Depression (PHQ2-9)    PHQ-2 Score: 2  Alcohol  Screen: Low Risk (12/30/2021)   Alcohol  Screen    Last Alcohol  Screening Score (AUDIT): 0  Housing: Low Risk (11/26/2024)   Epic    Unable to Pay for Housing in the Last Year: No    Number of Times Moved in the Last Year: 0    Homeless in the Last Year: No  Utilities: Not At Risk (11/26/2024)   Epic    Threatened with loss of utilities: No  Health Literacy: Not on file    Review of Systems:    Constitutional: No weight loss, fever, chills, weakness or fatigue HEENT: Eyes: No change in vision               Ears, Nose, Throat:  No change in hearing or congestion Skin: No rash or itching Cardiovascular: No chest pain, chest pressure or palpitations   Respiratory: No SOB or cough Gastrointestinal: See HPI and otherwise negative Genitourinary: No dysuria or change in urinary frequency Neurological: No headache, dizziness or syncope Musculoskeletal: No new muscle or joint pain Hematologic: No bleeding or bruising Psychiatric: No history of  depression or anxiety    Physical Exam:  Vital signs: BP 122/68   Pulse (!) 58   Ht 5' 3 (1.6 m)   Wt (!) 302 lb (137 kg) Comment: verbal  BMI 53.50 kg/m   Constitutional: NAD, alert and cooperative.  Obese in wheelchair. Head:  Normocephalic and atraumatic. Eyes:   PEERL, EOMI. No icterus. Conjunctiva pink. Respiratory: Respirations even and unlabored. Lungs clear to auscultation bilaterally.   No wheezes, crackles, or rhonchi.  Cardiovascular:  Regular rate and rhythm.  +1 pitting edema., cyanosis or pallor.  Gastrointestinal:  Soft, nondistended, nontender. No rebound or guarding. Normal bowel sounds. No appreciable masses or hepatomegaly. Rectal:  Declines Msk:  Symmetrical without gross deformities. Without edema, no deformity or joint abnormality.  Neurologic:  Alert and  oriented x4;  grossly normal neurologically.  Skin:   Dry and intact without significant lesions or rashes. Psychiatric: Oriented to person, place and time. Demonstrates good judgement and reason without abnormal affect or behaviors.  Physical Exam    RELEVANT LABS AND IMAGING: CBC    Component Value Date/Time   WBC 6.8 11/29/2024 0442   RBC 3.76 (L) 11/29/2024 0442   HGB 11.2 (L) 11/29/2024 0442   HGB 12.5 08/10/2022 1046   HCT 34.7 (L) 11/29/2024 0442   HCT 37.0 08/10/2022 1046   PLT 234 11/29/2024 0442   PLT 339 08/10/2022 1046   MCV  92.3 11/29/2024 0442   MCV 89 08/10/2022 1046   MCH 29.8 11/29/2024 0442   MCHC 32.3 11/29/2024 0442   RDW 13.7 11/29/2024 0442   RDW 13.5 08/10/2022 1046   LYMPHSABS 2.6 11/26/2024 1134   MONOABS 0.9 11/26/2024 1134   EOSABS 0.1 11/26/2024 1134   BASOSABS 0.0 11/26/2024 1134    CMP     Component Value Date/Time   NA 140 12/26/2024 1048   K 4.8 12/26/2024 1048   CL 104 12/26/2024 1048   CO2 22 12/26/2024 1048   GLUCOSE 85 12/26/2024 1048   GLUCOSE 88 11/29/2024 0442   GLUCOSE 107 (H) 12/31/2006 0847   BUN 12 12/26/2024 1048   CREATININE 1.06 (H)  12/26/2024 1048   CREATININE 1.05 (H) 09/10/2020 1255   CALCIUM  9.0 12/26/2024 1048   PROT 6.3 (L) 10/24/2024 0451   ALBUMIN  3.7 10/24/2024 0451   AST 19 10/24/2024 0451   ALT 14 10/24/2024 0451   ALKPHOS 102 10/24/2024 0451   BILITOT 0.9 10/24/2024 0451   GFRNONAA >60 11/29/2024 0442   GFRNONAA 83 03/24/2016 1607   GFRAA >60 08/06/2020 1611   GFRAA >89 03/24/2016 1607     Assessment/Plan:   69 year old female history of chronic diastolic heart failure, morbid obesity (BMI 54), diabetes type 2, hypertension, anxiety/depression, GERD, presents for evaluation of [place] polyps  History of [place] polyps Colonoscopy 09/2019 with 3 tubular adenomas and recall 3 to 5 years.  This was done in the hospital setting due to BMI over 50.  BMI currently 53. Patient has had frequent hospitalizations for heart failure this year (8) and is currently going through rehab for primary bone arthrosis and and able to walk as she is in a wheelchair.  Patient would like to hold off on colonoscopy for now which I feel is reasonable with absence of symptoms and relatively benign colonoscopy with 3 tubular adenomas 4 mm and under.  Also suspect prep would be difficult for her with her impaired mobility at this time. - Follow-up with me in 6 months - Continue weight loss - Continue rehab - If BMI is under 50 at our next visit we can pursue colonoscopy in the [facility] - If symptoms of change in bowel habits, weight loss, rectal bleeding-occur for next visit please let me know  Chronic diastolic heart failure Frequent hospitalizations this year for exacerbations with echocardiogram pending for cardiac murmur scheduled in February - Scheduled for repeat echocardiogram February 2026  Obesity BMI 53  Nestor Mollie RIGGERS [facility] 12/29/2024, 3:23 PM  Cc: Ileen [name], NP "

## 2024-12-29 ENCOUNTER — Encounter: Payer: Self-pay | Admitting: Gastroenterology

## 2024-12-29 ENCOUNTER — Ambulatory Visit: Admitting: Gastroenterology

## 2024-12-29 VITALS — BP 122/68 | HR 58 | Ht 63.0 in | Wt 302.0 lb

## 2024-12-29 DIAGNOSIS — K219 Gastro-esophageal reflux disease without esophagitis: Secondary | ICD-10-CM | POA: Diagnosis not present

## 2024-12-29 DIAGNOSIS — N1831 Chronic kidney disease, stage 3a: Secondary | ICD-10-CM | POA: Diagnosis not present

## 2024-12-29 DIAGNOSIS — I5032 Chronic diastolic (congestive) heart failure: Secondary | ICD-10-CM

## 2024-12-29 DIAGNOSIS — E119 Type 2 diabetes mellitus without complications: Secondary | ICD-10-CM | POA: Diagnosis not present

## 2024-12-29 DIAGNOSIS — Z6841 Body Mass Index (BMI) 40.0 and over, adult: Secondary | ICD-10-CM

## 2024-12-29 DIAGNOSIS — Z860100 Personal history of colon polyps, unspecified: Secondary | ICD-10-CM | POA: Diagnosis not present

## 2024-12-29 NOTE — Patient Instructions (Addendum)
 _______________________________________________________  If your blood pressure at your visit was 140/90 or greater, please contact your primary care physician to follow up on this.  _______________________________________________________  If you are age 68 or older, your body mass index should be between 23-30. Your Body mass index is 53.5 kg/m. If this is out of the aforementioned range listed, please consider follow up with your Primary Care Provider.  If you are age 5 or younger, your body mass index should be between 19-25. Your Body mass index is 53.5 kg/m. If this is out of the aformentioned range listed, please consider follow up with your Primary Care Provider.   ________________________________________________________  The [facility] providers would like to encourage you to use MYCHART to communicate with providers for non-urgent requests or questions.  Due to long hold times on the telephone, sending your provider a message by [facility] may be a faster and more efficient way to get a response.  Please allow 48 business hours for a response.  Please remember that this is for non-urgent requests.  _______________________________________________________  [facility] is using a team-based approach to care.  Your team is made up of your doctor and two to three APPS. Our APPS (Nurse Practitioners and Physician Assistants) work with your physician to ensure care continuity for you. They are fully qualified to address your health concerns and develop a treatment plan. They communicate directly with your gastroenterologist to care for you. Seeing the Advanced Practice Practitioners on your physician's team can help you by facilitating care more promptly, often allowing for earlier appointments, access to diagnostic testing, procedures, and other specialty referrals.   You will need a follow up appointment in 6 months (July 2026).

## 2024-12-29 NOTE — Progress Notes (Signed)
 Agree with assessment and plan as outlined.  Agree that she should address her most pressing healthcare concerns at this time, recover from her heart failure and reassess in 6 months to see if she is in a place where she could consider colonoscopy.  If her comorbidities continue to progress, may forego future colonoscopy surveillance exams
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
## 2025-01-05 ENCOUNTER — Ambulatory Visit: Admitting: Physician Assistant

## 2025-01-05 ENCOUNTER — Encounter: Payer: Self-pay | Admitting: Physician Assistant

## 2025-01-22 NOTE — Progress Notes (Unsigned)
" °  Cardiology Office Note   Date:  01/22/2025  ID:  Zyanne Schumm Frisch, DOB July 21, 1956, MRN [id number] PCP: Ileen [name], NP  [facility] Providers Cardiologist:  Lynwood Schilling, MD   History of Present Illness Maria [name] is a 69 y.o. female with a past medical history of chronic diastolic heart failure, HTN, HLD, GERD, anxiety, depression, type 2 DM. Presents today for a 1 month follow up   Patient has been followed by Dr. Schilling since 2022 for her chronic diastolic heart failure. An echocardiogram in 01/2023 showed EF 65-70%, grade II DD, normal RV systolic function, moderately elevated PA systolic pressure, small pericardial effusion.   She was admitted in 08/2024 with lower extremity weakness. Cardiology was asked to evaluate prior to cervical stenosis surgery. She was diuresed following surgery. Initially torsemide  and spironolactone  were held, but they were resumed at discharge   She was seen in the ED on 10/24/24 with peripheral edema. DVT sudy negative. Seen again on 10/26/24 after a fall. Seen 10/28/24 with L hip pain. Recommended DC to SNF, but she was ultimately discharged home. Admitted in 11/2024 with pyelonephritis   Last seen by cardiology on 12/26/24. At that time, patient was living in SNF. Breathing was OK. Remained on spironolactone  25 mg, torsemide  40 mg BID, toprol  25 mg daily. BMP stable. Echocardiogram on 01/24/25 showed ***  Chronic diastolic heart failure  HTN  -  - Continue toprol  xl 25 mg daily, spironolactone  25 mg daily, torsemide  40 mg BID - Creatinine 1.06 and K 4.8 on 12/26/24   HLD  - Labs followed by PCP. Not currently on statin therapy   Deconditioning, morbid obesity      ROS: ***  Studies Reviewed      *** Risk Assessment/Calculations {Does this patient have ATRIAL FIBRILLATION?:[phone]} No BP recorded.  {Refresh Note OR Click here to enter BP  :1}***       Physical Exam VS:  There were no vitals taken for this visit.        Wt Readings from Last 3 Encounters:  12/29/24 (!) 302 lb (137 kg)  12/26/24 (!) 305 lb (138.3 kg)  11/26/24 (!) 328 lb 7.8 oz (149 kg)    GEN: Well nourished, well developed in no acute distress NECK: No JVD; No carotid bruits CARDIAC: ***RRR, no murmurs, rubs, gallops RESPIRATORY:  Clear to auscultation without rales, wheezing or rhonchi  ABDOMEN: Soft, non-tender, non-distended EXTREMITIES:  No edema; No deformity   ASSESSMENT AND PLAN ***    {Are you ordering a CV Procedure (e.g. stress test, cath, DCCV, TEE, etc)?   Press F2        :[id number]}  Dispo: ***  Signed, Rollo FABIENE Louder, PA-C   "

## 2025-01-23 ENCOUNTER — Ambulatory Visit (INDEPENDENT_AMBULATORY_CARE_PROVIDER_SITE_OTHER)

## 2025-01-24 ENCOUNTER — Ambulatory Visit (HOSPITAL_COMMUNITY): Admission: RE | Admit: 2025-01-24 | Source: Ambulatory Visit

## 2025-02-05 ENCOUNTER — Ambulatory Visit: Admitting: Cardiology

## 2025-02-23 ENCOUNTER — Ambulatory Visit (HOSPITAL_COMMUNITY)

## 2025-11-13 ENCOUNTER — Inpatient Hospital Stay: Admitting: Internal Medicine
